# Patient Record
Sex: Female | Born: 1950
Health system: Southern US, Community
[De-identification: ages and names within clinical notes are randomized; demographics above are authoritative.]

## PROBLEM LIST (undated history)

## (undated) DIAGNOSIS — R188 Other ascites: Secondary | ICD-10-CM

## (undated) DIAGNOSIS — E785 Hyperlipidemia, unspecified: Secondary | ICD-10-CM

## (undated) DIAGNOSIS — I10 Essential (primary) hypertension: Secondary | ICD-10-CM

## (undated) DIAGNOSIS — E119 Type 2 diabetes mellitus without complications: Secondary | ICD-10-CM

## (undated) DIAGNOSIS — D510 Vitamin B12 deficiency anemia due to intrinsic factor deficiency: Secondary | ICD-10-CM

## (undated) DIAGNOSIS — C9310 Chronic myelomonocytic leukemia not having achieved remission: Secondary | ICD-10-CM

## (undated) DIAGNOSIS — E559 Vitamin D deficiency, unspecified: Secondary | ICD-10-CM

## (undated) HISTORY — DX: Hyperlipidemia, unspecified: E78.5

## (undated) HISTORY — DX: Vitamin D deficiency, unspecified: E55.9

## (undated) HISTORY — DX: Essential (primary) hypertension: I10

## (undated) HISTORY — DX: Vitamin B12 deficiency anemia due to intrinsic factor deficiency: D51.0

## (undated) HISTORY — DX: Type 2 diabetes mellitus without complications: E11.9

## (undated) HISTORY — PX: ABDOMINOPLASTY: SUR9

---
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
## 1998-06-13 HISTORY — PX: GASTRIC BYPASS: SHX52

## 1998-10-23 ENCOUNTER — Other Ambulatory Visit: Admission: RE | Admit: 1998-10-23 | Discharge: 1998-10-23 | Payer: Self-pay | Admitting: Internal Medicine

## 2000-10-02 ENCOUNTER — Other Ambulatory Visit: Admission: RE | Admit: 2000-10-02 | Discharge: 2000-10-02 | Payer: Self-pay | Admitting: Internal Medicine

## 2000-10-27 ENCOUNTER — Encounter: Admission: RE | Admit: 2000-10-27 | Discharge: 2001-01-25 | Payer: Self-pay | Admitting: Surgical Oncology

## 2003-01-01 ENCOUNTER — Encounter: Payer: Self-pay | Admitting: Specialist

## 2003-01-03 ENCOUNTER — Inpatient Hospital Stay (HOSPITAL_COMMUNITY): Admission: RE | Admit: 2003-01-03 | Discharge: 2003-01-06 | Payer: Self-pay | Admitting: Specialist

## 2003-01-03 ENCOUNTER — Encounter (INDEPENDENT_AMBULATORY_CARE_PROVIDER_SITE_OTHER): Payer: Self-pay | Admitting: Specialist

## 2004-04-23 ENCOUNTER — Ambulatory Visit: Payer: Self-pay | Admitting: Internal Medicine

## 2004-07-23 ENCOUNTER — Ambulatory Visit: Payer: Self-pay | Admitting: Internal Medicine

## 2004-10-22 ENCOUNTER — Ambulatory Visit: Payer: Self-pay | Admitting: Internal Medicine

## 2004-12-24 ENCOUNTER — Ambulatory Visit: Payer: Self-pay | Admitting: Internal Medicine

## 2005-03-31 ENCOUNTER — Ambulatory Visit: Payer: Self-pay | Admitting: Internal Medicine

## 2005-06-13 HISTORY — PX: OTHER SURGICAL HISTORY: SHX169

## 2005-06-17 ENCOUNTER — Ambulatory Visit: Payer: Self-pay | Admitting: Internal Medicine

## 2005-06-27 ENCOUNTER — Ambulatory Visit: Payer: Self-pay | Admitting: Internal Medicine

## 2005-10-07 ENCOUNTER — Ambulatory Visit: Payer: Self-pay | Admitting: Internal Medicine

## 2006-03-10 ENCOUNTER — Ambulatory Visit: Payer: Self-pay | Admitting: Internal Medicine

## 2006-05-26 ENCOUNTER — Ambulatory Visit: Payer: Self-pay | Admitting: Internal Medicine

## 2006-05-26 LAB — CONVERTED CEMR LAB: Folate: 13.5 ng/mL

## 2006-08-28 ENCOUNTER — Ambulatory Visit: Payer: Self-pay | Admitting: Internal Medicine

## 2006-11-24 ENCOUNTER — Ambulatory Visit: Payer: Self-pay | Admitting: Internal Medicine

## 2006-11-24 LAB — CONVERTED CEMR LAB
AST: 19 units/L (ref 0–37)
Albumin: 3.9 g/dL (ref 3.5–5.2)
Alkaline Phosphatase: 74 units/L (ref 39–117)
Basophils Relative: 0.7 % (ref 0.0–1.0)
Bilirubin, Direct: 0.1 mg/dL (ref 0.0–0.3)
CO2: 29 meq/L (ref 19–32)
Calcium: 9.2 mg/dL (ref 8.4–10.5)
Cholesterol: 206 mg/dL (ref 0–200)
Creatinine,U: 158.7 mg/dL
Eosinophils Relative: 0.9 % (ref 0.0–5.0)
GFR calc Af Amer: 133 mL/min
HCT: 38.9 % (ref 36.0–46.0)
HDL: 39.4 mg/dL (ref >39.0)
Hgb A1c MFr Bld: 8.3 % — ABNORMAL HIGH (ref 4.6–6.0)
Monocytes Relative: 7.7 % (ref 3.0–11.0)
Neutro Abs: 4.6 10*3/uL (ref 1.4–7.7)
Potassium: 3.7 meq/L (ref 3.5–5.1)
RDW: 12.2 % (ref 11.5–14.6)
Sodium: 141 meq/L (ref 135–145)
TSH: 1.64 microintl units/mL (ref 0.35–5.50)
Total Protein: 7.2 g/dL (ref 6.0–8.3)
VLDL: 33 mg/dL (ref 0–40)

## 2006-12-01 ENCOUNTER — Ambulatory Visit: Payer: Self-pay | Admitting: Internal Medicine

## 2006-12-27 DIAGNOSIS — I10 Essential (primary) hypertension: Secondary | ICD-10-CM | POA: Insufficient documentation

## 2006-12-27 DIAGNOSIS — E669 Obesity, unspecified: Secondary | ICD-10-CM | POA: Insufficient documentation

## 2007-01-19 ENCOUNTER — Ambulatory Visit: Payer: Self-pay | Admitting: Internal Medicine

## 2007-01-23 DIAGNOSIS — E1142 Type 2 diabetes mellitus with diabetic polyneuropathy: Secondary | ICD-10-CM | POA: Insufficient documentation

## 2007-01-23 DIAGNOSIS — E1165 Type 2 diabetes mellitus with hyperglycemia: Secondary | ICD-10-CM

## 2007-01-31 ENCOUNTER — Telehealth (INDEPENDENT_AMBULATORY_CARE_PROVIDER_SITE_OTHER): Payer: Self-pay | Admitting: *Deleted

## 2007-03-02 ENCOUNTER — Ambulatory Visit: Payer: Self-pay | Admitting: Internal Medicine

## 2007-03-02 DIAGNOSIS — D510 Vitamin B12 deficiency anemia due to intrinsic factor deficiency: Secondary | ICD-10-CM | POA: Insufficient documentation

## 2007-04-20 ENCOUNTER — Ambulatory Visit: Payer: Self-pay | Admitting: Internal Medicine

## 2007-04-20 LAB — CONVERTED CEMR LAB
Calcium: 9.7 mg/dL (ref 8.4–10.5)
GFR calc Af Amer: 112 mL/min
GFR calc non Af Amer: 92 mL/min
Hgb A1c MFr Bld: 6.8 % — ABNORMAL HIGH (ref 4.6–6.0)
Potassium: 5.2 meq/L — ABNORMAL HIGH (ref 3.5–5.1)

## 2007-04-27 ENCOUNTER — Ambulatory Visit: Payer: Self-pay | Admitting: Internal Medicine

## 2007-05-22 ENCOUNTER — Telehealth (INDEPENDENT_AMBULATORY_CARE_PROVIDER_SITE_OTHER): Payer: Self-pay | Admitting: *Deleted

## 2007-06-14 HISTORY — PX: ABDOMINAL HERNIA REPAIR: SHX539

## 2007-07-06 ENCOUNTER — Encounter: Payer: Self-pay | Admitting: Internal Medicine

## 2007-07-13 ENCOUNTER — Ambulatory Visit: Payer: Self-pay | Admitting: Internal Medicine

## 2007-07-18 ENCOUNTER — Telehealth: Payer: Self-pay | Admitting: Internal Medicine

## 2007-07-30 ENCOUNTER — Ambulatory Visit (HOSPITAL_BASED_OUTPATIENT_CLINIC_OR_DEPARTMENT_OTHER): Admission: RE | Admit: 2007-07-30 | Discharge: 2007-07-30 | Payer: Self-pay | Admitting: Gynecology

## 2007-09-03 ENCOUNTER — Ambulatory Visit: Payer: Self-pay | Admitting: Internal Medicine

## 2007-09-08 ENCOUNTER — Emergency Department (HOSPITAL_COMMUNITY): Admission: EM | Admit: 2007-09-08 | Discharge: 2007-09-08 | Payer: Self-pay | Admitting: Emergency Medicine

## 2007-09-08 DIAGNOSIS — M542 Cervicalgia: Secondary | ICD-10-CM | POA: Insufficient documentation

## 2007-09-11 ENCOUNTER — Ambulatory Visit: Payer: Self-pay | Admitting: Internal Medicine

## 2007-09-28 ENCOUNTER — Telehealth: Payer: Self-pay | Admitting: *Deleted

## 2007-09-28 ENCOUNTER — Encounter: Payer: Self-pay | Admitting: Internal Medicine

## 2007-10-05 ENCOUNTER — Ambulatory Visit: Payer: Self-pay | Admitting: Internal Medicine

## 2007-10-05 DIAGNOSIS — E785 Hyperlipidemia, unspecified: Secondary | ICD-10-CM | POA: Insufficient documentation

## 2007-10-05 DIAGNOSIS — T887XXA Unspecified adverse effect of drug or medicament, initial encounter: Secondary | ICD-10-CM | POA: Insufficient documentation

## 2007-10-05 LAB — CONVERTED CEMR LAB
Albumin: 3.9 g/dL (ref 3.5–5.2)
Alkaline Phosphatase: 68 units/L (ref 39–117)
Bilirubin, Direct: 0.1 mg/dL (ref 0.0–0.3)
HDL: 36.8 mg/dL — ABNORMAL LOW (ref >39.0)
Hgb A1c MFr Bld: 6.8 % — ABNORMAL HIGH (ref 4.6–6.0)
Microalb Creat Ratio: 3.4 mg/g (ref 0.0–30.0)
Microalb, Ur: 0.4 mg/dL (ref 0.0–1.9)
Total CHOL/HDL Ratio: 5.6
Total Protein: 6.8 g/dL (ref 6.0–8.3)
VLDL: 37 mg/dL (ref 0–40)

## 2007-10-09 ENCOUNTER — Telehealth: Payer: Self-pay | Admitting: Internal Medicine

## 2007-10-09 ENCOUNTER — Encounter: Payer: Self-pay | Admitting: Internal Medicine

## 2007-10-26 ENCOUNTER — Telehealth (INDEPENDENT_AMBULATORY_CARE_PROVIDER_SITE_OTHER): Payer: Self-pay | Admitting: *Deleted

## 2007-11-16 ENCOUNTER — Ambulatory Visit: Payer: Self-pay | Admitting: Internal Medicine

## 2007-11-16 DIAGNOSIS — E1142 Type 2 diabetes mellitus with diabetic polyneuropathy: Secondary | ICD-10-CM | POA: Insufficient documentation

## 2008-01-18 ENCOUNTER — Ambulatory Visit: Payer: Self-pay | Admitting: Internal Medicine

## 2008-01-30 ENCOUNTER — Ambulatory Visit: Payer: Self-pay | Admitting: Internal Medicine

## 2008-01-30 LAB — CONVERTED CEMR LAB
Basophils Absolute: 0 10*3/uL (ref 0.0–0.1)
Basophils Relative: 0.3 % (ref 0.0–3.0)
Calcium: 9.4 mg/dL (ref 8.4–10.5)
Eosinophils Absolute: 0 10*3/uL (ref 0.0–0.7)
Folate: 9.7 ng/mL
GFR calc Af Amer: 133 mL/min
GFR calc non Af Amer: 110 mL/min
Glucose, Bld: 80 mg/dL (ref 70–99)
Hgb A1c MFr Bld: 7 % — ABNORMAL HIGH (ref 4.6–6.0)
Lymphocytes Relative: 31.4 % (ref 12.0–46.0)
MCHC: 34.8 g/dL (ref 30.0–36.0)
MCV: 91.2 fL (ref 78.0–100.0)
Neutrophils Relative %: 56.4 % (ref 43.0–77.0)
Platelets: 194 10*3/uL (ref 150–400)
Potassium: 4 meq/L (ref 3.5–5.1)
RBC: 4.05 M/uL (ref 3.87–5.11)
Sodium: 144 meq/L (ref 135–145)
WBC: 6.8 10*3/uL (ref 4.5–10.5)

## 2008-04-22 ENCOUNTER — Ambulatory Visit: Payer: Self-pay | Admitting: Internal Medicine

## 2008-04-22 LAB — CONVERTED CEMR LAB
Calcium: 9.3 mg/dL (ref 8.4–10.5)
Creatinine,U: 77.3 mg/dL
GFR calc Af Amer: 133 mL/min
GFR calc non Af Amer: 110 mL/min
Microalb Creat Ratio: 5.2 mg/g (ref 0.0–30.0)
Microalb, Ur: 0.4 mg/dL (ref 0.0–1.9)
Potassium: 4.6 meq/L (ref 3.5–5.1)
Sodium: 141 meq/L (ref 135–145)

## 2008-06-03 ENCOUNTER — Ambulatory Visit: Payer: Self-pay | Admitting: Internal Medicine

## 2008-06-18 ENCOUNTER — Encounter: Payer: Self-pay | Admitting: Gynecology

## 2008-06-18 ENCOUNTER — Encounter: Payer: Self-pay | Admitting: Internal Medicine

## 2008-06-18 ENCOUNTER — Other Ambulatory Visit: Admission: RE | Admit: 2008-06-18 | Discharge: 2008-06-18 | Payer: Self-pay | Admitting: Gynecology

## 2008-06-18 ENCOUNTER — Ambulatory Visit: Payer: Self-pay | Admitting: Gynecology

## 2008-08-27 ENCOUNTER — Ambulatory Visit: Payer: Self-pay | Admitting: Internal Medicine

## 2008-12-03 ENCOUNTER — Ambulatory Visit: Payer: Self-pay | Admitting: Internal Medicine

## 2009-01-09 ENCOUNTER — Ambulatory Visit: Payer: Self-pay | Admitting: Internal Medicine

## 2009-02-13 ENCOUNTER — Ambulatory Visit: Payer: Self-pay | Admitting: Internal Medicine

## 2009-02-13 LAB — CONVERTED CEMR LAB
BUN: 15 mg/dL (ref 6–23)
Calcium: 9.3 mg/dL (ref 8.4–10.5)
Creatinine, Ser: 0.7 mg/dL (ref 0.4–1.2)
GFR calc non Af Amer: 91.52 mL/min (ref >60)
Glucose, Bld: 155 mg/dL — ABNORMAL HIGH (ref 70–99)
Hgb A1c MFr Bld: 8 % — ABNORMAL HIGH (ref 4.6–6.5)
Potassium: 4.9 meq/L (ref 3.5–5.1)

## 2009-04-03 ENCOUNTER — Ambulatory Visit: Payer: Self-pay | Admitting: Internal Medicine

## 2009-10-06 DIAGNOSIS — G4700 Insomnia, unspecified: Secondary | ICD-10-CM | POA: Insufficient documentation

## 2009-10-07 ENCOUNTER — Encounter: Payer: Self-pay | Admitting: Internal Medicine

## 2009-10-16 ENCOUNTER — Ambulatory Visit: Payer: Self-pay | Admitting: Internal Medicine

## 2009-10-16 LAB — CONVERTED CEMR LAB
BUN: 14 mg/dL (ref 6–23)
Calcium: 8.9 mg/dL (ref 8.4–10.5)
Chloride: 98 meq/L (ref 96–112)
GFR calc non Af Amer: 105.03 mL/min (ref >60)
Hgb A1c MFr Bld: 9.7 % — ABNORMAL HIGH (ref 4.6–6.5)

## 2009-10-20 ENCOUNTER — Encounter: Payer: Self-pay | Admitting: Internal Medicine

## 2009-10-28 ENCOUNTER — Ambulatory Visit: Payer: Self-pay | Admitting: Internal Medicine

## 2010-07-14 ENCOUNTER — Other Ambulatory Visit: Payer: Self-pay | Admitting: *Deleted

## 2010-07-14 ENCOUNTER — Other Ambulatory Visit: Payer: Self-pay | Admitting: Internal Medicine

## 2010-07-14 DIAGNOSIS — G4700 Insomnia, unspecified: Secondary | ICD-10-CM

## 2010-07-14 MED ORDER — ZOLPIDEM TARTRATE 10 MG PO TABS: 10.0000 mg | ORAL_TABLET | Freq: Every evening | ORAL | Status: DC | PRN

## 2010-07-15 NOTE — Medication Information (Signed)
Summary: Coverage Approval for Zolpidem Tartrate  Coverage Approval for Zolpidem Tartrate   Imported By: Maryln Gottron 10/12/2009 12:46:24  _____________________________________________________________________  External Attachment:    Type:   Image     Comment:   External Document

## 2010-07-15 NOTE — Assessment & Plan Note (Signed)
Summary: fup on labs//ccm   Vital Signs:  Patient profile:   60 year old female Height:      65 inches Weight:      236 pounds BMI:     39.41 Temp:     98.2 degrees F oral Pulse rate:   108 / minute Resp:     14 per minute BP sitting:   130 / 84  (left arm)  Vitals Entered By: Willy Eddy, LPN (Oct 28, 2009 9:02 AM) CC: roa labs, Hypertension Management   CC:  roa labs and Hypertension Management.  History of Present Illness: The pt has "slipped" and the a1c showes this the fasting CBG 323 has not been doing the finger sticks but resumed about 3 weeks ago got discouraged, moderate depression that she feels that she is "pulling out of" has started walking ans quit buying candy  Hypertension History:      She denies headache, chest pain, palpitations, dyspnea with exertion, orthopnea, PND, peripheral edema, visual symptoms, neurologic problems, syncope, and side effects from treatment.        Positive major cardiovascular risk factors include female age 95 years old or older, diabetes, hyperlipidemia, hypertension, and family history for ischemic heart disease (females less than 77 years old & males less than 46 years old).  Negative major cardiovascular risk factors include non-tobacco-user status.     Preventive Screening-Counseling & Management  Alcohol-Tobacco     Smoking Status: never     Passive Smoke Exposure: no  Problems Prior to Update: 1)  Insomnia, Chronic  (ICD-307.42) 2)  Polyneuropathy in Diabetes  (ICD-357.2) 3)  Uns Advrs Eff Uns Rx Medicinal&biological Sbstnc  (ICD-995.20) 4)  Hyperlipidemia  (ICD-272.4) 5)  Neck and Back Pain  (ICD-723.1) 6)  Preoperative Examination  (ICD-V72.84) 7)  Anemia, Pernicious  (ICD-281.0) 8)  Diabetes Mellitus, Type II  (ICD-250.00) 9)  Family History Diabetes 1st Degree Relative  (ICD-V18.0) 10)  Obesity  (ICD-278.00) 11)  Hypertension  (ICD-401.9)  Current Problems (verified): 1)  Insomnia, Chronic   (ICD-307.42) 2)  Polyneuropathy in Diabetes  (ICD-357.2) 3)  Uns Advrs Eff Uns Rx Medicinal&biological Sbstnc  (ICD-995.20) 4)  Hyperlipidemia  (ICD-272.4) 5)  Neck and Back Pain  (ICD-723.1) 6)  Preoperative Examination  (ICD-V72.84) 7)  Anemia, Pernicious  (ICD-281.0) 8)  Diabetes Mellitus, Type II  (ICD-250.00) 9)  Family History Diabetes 1st Degree Relative  (ICD-V18.0) 10)  Obesity  (ICD-278.00) 11)  Hypertension  (ICD-401.9)  Medications Prior to Update: 1)  Ambien 10 Mg  Tabs (Zolpidem Tartrate) .... As Needed 2)  Lisinopril-Hydrochlorothiazide 20-25 Mg Tabs (Lisinopril-Hydrochlorothiazide) .... One By Mouth Daily 3)  Vitamin B-12 Cr 1000 Mcg  Tbcr (Cyanocobalamin) .... Every Month 4)  Glimepiride 4 Mg Tabs (Glimepiride) .... One By Mouth Daily 5)  Accu-Chek Aviva   Strp (Glucose Blood) .[name].. 1 Once Dailyhold 6)  Metformin Hcl 500 Mg Xr24h-Tab (Metformin Hcl) .... One By Mouth Bid  Current Medications (verified): 1)  Ambien 10 Mg  Tabs (Zolpidem Tartrate) .... As Needed 2)  Lisinopril-Hydrochlorothiazide 20-25 Mg Tabs (Lisinopril-Hydrochlorothiazide) .... One By Mouth Daily 3)  Vitamin B-12 Cr 1000 Mcg  Tbcr (Cyanocobalamin) .... Every Month 4)  Glimepiride 4 Mg Tabs (Glimepiride) .... One By Mouth Daily 5)  Accu-Chek Aviva   Strp (Glucose Blood) .[name].. 1 Once Dailyhold 6)  Metformin Hcl 500 Mg Xr24h-Tab (Metformin Hcl) .... One By Mouth Bid  Allergies (verified): No Known Drug Allergies  Past History:  Family History: Last updated: 07/13/2007  Family History Other cancer: brother had colon cancer Fam hx CVA n=mother and father both had CVA at advanced age Family History Diabetes 1st degree relative: mother  Social History: Last updated: 01/23/2007 Never Smoked Alcohol use-no Drug use-no Occupation: Training and development officer Single  Risk Factors: Exercise: no (03/02/2007)  Risk Factors: Smoking Status: never (10/28/2009) Passive Smoke Exposure: no (10/28/2009)  Past  medical, surgical, family and social histories (including risk factors) reviewed, and no changes noted (except as noted below).  Past Medical History: Reviewed history from 01/30/2008 and no changes required. Hypertension Diabetes mellitus, type II Obesity b12  deficiency  Past Surgical History: Reviewed history from 07/13/2007 and no changes required. Caesarean section Tubal ligation Tonsillectomy Hemorrhoidectomy  Family History: Reviewed history from 07/13/2007 and no changes required. Family History Other cancer: brother had colon cancer Fam hx CVA n=mother and father both had CVA at advanced age Family History Diabetes 1st degree relative: mother  Social History: Reviewed history from 01/23/2007 and no changes required. Never Smoked Alcohol use-no Drug use-no Occupation: Training and development officer Single  Review of Systems       The patient complains of weight gain.  The patient denies anorexia, fever, weight loss, vision loss, decreased hearing, hoarseness, chest pain, syncope, dyspnea on exertion, peripheral edema, prolonged cough, headaches, hemoptysis, abdominal pain, melena, hematochezia, severe indigestion/heartburn, hematuria, incontinence, genital sores, muscle weakness, suspicious skin lesions, transient blindness, difficulty walking, depression, unusual weight change, abnormal bleeding, enlarged lymph nodes, angioedema, and breast masses.         tinnitus  Physical Exam  General:  well-developed and well-nourished.   Head:  normocephalic and atraumatic.   Eyes:  pupils equal and pupils round.   Ears:  R ear normal and L ear normal.   Neck:  supple and full ROM.   Lungs:  Normal respiratory effort, chest expands symmetrically. Lungs are clear to auscultation, no crackles or wheezes. Heart:  regular rhythm, tachycardia, and Grade  1 /6 systolic ejection murmur.   Abdomen:  soft, non-tender, and normal bowel sounds.     Impression & Recommendations:  Problem # 1:   POLYNEUROPATHY IN DIABETES (ICD-357.2) the a 1c places her at increased risk this was explained  Problem # 2:  ANEMIA, PERNICIOUS (ICD-281.0) monitering Her updated medication list for this problem includes:    Vitamin B-12 Cr 1000 Mcg Tbcr (Cyanocobalamin) ..... Every month  Orders: Vit B12 1000 mcg (J3420) Admin of Therapeutic Inj  intramuscular or subcutaneous (16109)  Hgb: 12.9 (01/30/2008)   Hct: 36.9 (01/30/2008)   Platelets: 194 (01/30/2008) RBC: 4.05 (01/30/2008)   RDW: 13.6 (01/30/2008)   WBC: 6.8 (01/30/2008) MCV: 91.2 (01/30/2008)   MCHC: 34.8 (01/30/2008) B12: 989 (01/30/2008)   Folate: 9.7 (01/30/2008)   TSH: 1.64 (11/24/2006)  Problem # 3:  DIABETES MELLITUS, TYPE II (ICD-250.00)  Her updated medication list for this problem includes:    Lisinopril-hydrochlorothiazide 20-25 Mg Tabs (Lisinopril-hydrochlorothiazide) ..... One by mouth daily    Glimepiride 4 Mg Tabs (Glimepiride) ..... One by mouth daily    Metformin Hcl 500 Mg Xr24h-tab (Metformin hcl) ..... One by mouth bid    Kombiglyze Xr 10-998 Mg Xr24h-tab (Saxagliptin-metformin) ..... One by mouth daily  Labs Reviewed: Creat: 0.6 (10/16/2009)     Last Eye Exam: normal (04/23/2009) Reviewed HgBA1c results: 9.7 (10/16/2009)  8.0 (02/13/2009)  Problem # 4:  OBESITY (ICD-278.00) weight loss is an essential part of the long term plan Ht: 65 (10/28/2009)   Wt: 236 (10/28/2009)   BMI: 39.41 (10/28/2009)  Complete  Medication List: 1)  Ambien 10 Mg Tabs (Zolpidem tartrate) .... As needed 2)  Lisinopril-hydrochlorothiazide 20-25 Mg Tabs (Lisinopril-hydrochlorothiazide) .... One by mouth daily 3)  Vitamin B-12 Cr 1000 Mcg Tbcr (Cyanocobalamin) .... Every month 4)  Glimepiride 4 Mg Tabs (Glimepiride) .... One by mouth daily 5)  Accu-chek Aviva Strp (Glucose blood) .[name].. 1 once dailyhold 6)  Metformin Hcl 500 Mg Xr24h-tab (Metformin hcl) .... One by mouth bid 7)  Kombiglyze Xr 10-998 Mg Xr24h-tab  (Saxagliptin-metformin) .... One by mouth daily  Hypertension Assessment/Plan:      The patient's hypertensive risk group is category C: Target organ damage and/or diabetes.  Today's blood pressure is 130/84.  Her blood pressure goal is < 130/80.  Patient Instructions: 1)  Please schedule a follow-up appointment in 2 months. 2)  HbgA1C prior to visit, ICD-9:  250.82 3)  It is important that you exercise regularly at least 20 minutes 5 times a week. If you develop chest pain, have severe difficulty breathing, or feel very tired , stop exercising immediately and seek medical attention. 4)  You need to lose weight. Consider a lower calorie diet and regular exercise.    Medication Administration  Injection # 1:    Medication: Vit B12 1000 mcg    Diagnosis: ANEMIA, PERNICIOUS (ICD-281.0)    Route: IM    Site: L deltoid    Exp Date: 03/12/2011    Lot #: [id number]    Mfr: [employer] Regent    Patient tolerated injection without complications    Given by: Willy Eddy, LPN (Oct 28, 2009 9:03 AM)  Orders Added: 1)  Vit B12 1000 mcg [J3420] 2)  Admin of Therapeutic Inj  intramuscular or subcutaneous [96372] 3)  Est. Patient Level IV [16109]

## 2010-09-06 ENCOUNTER — Encounter: Payer: Self-pay | Admitting: Internal Medicine

## 2010-10-26 NOTE — Op Note (Signed)
NAME:  Kathleen Gibson, Kathleen Gibson                   ACCOUNT NO.:  [id number]   MEDICAL RECORD NO.:  [id number]          PATIENT TYPE:  AMB   LOCATION:  [facility]                         FACILITY:  [facility]   PHYSICIAN:  Juan H. Lily Peer, M.D.DATE OF BIRTH:  1950-12-12   DATE OF PROCEDURE:  07/30/2007  DATE OF DISCHARGE:                               OPERATIVE REPORT   SURGEON:  Reynaldo Minium, MD.   FIRST ASSISTANT:  Colin Broach, MD.   INDICATIONS FOR OPERATION:  A 60 year old gravida 3, para 3 with  symptomatic rectocele.   PREOPERATIVE DIAGNOSIS:  Symptomatic rectocele.   POSTOPERATIVE DIAGNOSIS:  Symptomatic rectocele.   ANESTHESIA:  General endotracheal anesthesia.   FINDINGS:  Third-degree rectocele with normal uterine support.  No  enterocele.  Mild first-degree cystocele.   DESCRIPTION OF OPERATION:  After the patient was adequately counseled,  she was taken to the operating room where she underwent general  endotracheal anesthesia.  She had received a gram of Cefoxitin for  prophylaxis as well as PSA stockings for DVT prophylaxis.  She was  placed in the high lithotomy position.  A Foley catheter was inserted in  an effort to monitor urinary output.  The third-degree rectocele was  identified.  A triangular incision was made in the epithelium of the  overlying posterior fourchette perineal body.  The vaginal epithelium  was then opened in the midline, and dissection of posterior vaginal wall  was completed bilaterally exposing the fibromuscularis from side wall to  side wall.  The posterior colporrhaphy was then accomplished, including  the midline plication of vaginal fibromuscularis over the rectum using  interrupted sutures of 1-0 Vicryl suture.  The redundant vaginal mucosa  was then excised, and the vaginal mucosa was reapproximated with  interrupted sutures of 0 Vicryl suture.  The vagina was copiously  irrigated with normal saline solution and vaginal packing with Estrace  vaginal cream was placed into the vagina.  The patient was extubated and  transferred to recovery room with stable vital signs.  Blood loss from  procedure was 200 mL.  IV fluid consisted  of 1200 mL of Lactated  Ringer's.  Urine output was 250 mL and clear.      Juan H. Lily Peer, M.D.  Electronically Signed     JHF/MEDQ  D:  07/30/2007  T:  07/30/2007  Job:  [id number]

## 2010-10-26 NOTE — H&P (Signed)
NAME:  Kathleen Gibson, Kathleen Gibson                   ACCOUNT NO.:  [id number]   MEDICAL RECORD NO.:  [id number]          PATIENT TYPE:  AMB   LOCATION:  [facility]                         FACILITY:  [facility]   PHYSICIAN:  Juan H. Lily Peer, M.D.DATE OF BIRTH:  Aug 02, 1950   DATE OF ADMISSION:  DATE OF DISCHARGE:                              HISTORY & PHYSICAL   CHIEF COMPLAINT:  Symptomatic rectocele.   HISTORY:  The patient is a 60 year old gravida 3, para 3, who had been  seen in the practice on July 06, 2007, as a referral of Dr. Lovell Sheehan,  due to the fact that the patient has been complaining of fullness  sensation in her vagina.  It turned out the patient has a third-degree  rectocele and minimal, if any, first-degree cystocele.  No stress  urinary incontinence.  She underwent an urodynamic evaluation in the  office on June 26, 2007, with normal bladder function.  The  recommendation was to proceed for a posterior colporrhaphy without any  incontinence surgery.   PAST MEDICAL HISTORY:  The patient denies any allergies.  She has had 2  normal spontaneous vaginal deliveries and one cesarean section.  She has  a history of hypertension, type 2 diabetes, pernicious anemia, and  insomnia.  Previous operations consisted of cesarean section, tubal  ligation, tonsillectomy, hemorrhoidectomy, incisional herniorrhaphy, and  abdominoplasty.   For type 2 diabetes, she takes Diovan HCT 160/25 mg tablets once daily  and Janumet 50/1000 mg tablet one daily.  Last hemoglobin A1c in  November 2008 was 6.8.  Other medical problem is hypertension.  The  patient takes the Diovan HCT 160/25 mg daily.  She also takes Ambien 10  mg as needed.   SOCIAL HISTORY:  Her brother had had colon cancer.  Mother and father  both have cardiovascular disease, and first-degree relative and her  mother with diabetes.  The patient does not smoke.  Denies alcohol  consumption.  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   PHYSICAL EXAMINATION:   VITAL SIGNS:  The patient weighs 220 pounds,  temperature was 97.6, heart rate 120, respirations 14, and blood  pressure 130/70.  GENERAL:  Well-developed and well-nourished female.  HEENT:  Unremarkable.  NECK:  Supple.  Trachea midline.  No carotid bruits.  No thyromegaly.  LUNGS:  Clear to auscultation without rhonchi or wheezes.  HEART:  Regular rate and rhythm.  No murmurs or gallop.  BREASTS:  Not done.  ABDOMEN:  Pendulous.  Pfannenstiel scar present.  PELVIC:  Bartholin, urethral, and Skene glands are within normal limits.  A questionable first-degree cystocele and third-degree cystocele was  evident.  No evidence of an enterocele.  RECTAL:  Rectal exam confirm a third-degree rectocele.   ASSESSMENT:  A 60 year old gravida 3, para 3, with symptomatic rectocele  contributing to the patient's symptoms of pressure and fullness  sensation in the vaginal area.  The patient is scheduled to undergo a  posterior colporrhaphy in [facility] on Monday, July 30, 2007, at 7:30 a.m.  The risks, benefits, and pros and cons of the  procedure were discussed with the patient.  She will be placed on a 1-  day bowel prep as well.  The patient was cleared by Dr. Lovell Sheehan for her  surgery.  All questions were answered and we will follow accordingly.   PLAN:  The patient is scheduled for posterior colporrhaphy on Monday,  July 30, 2007, at 7:30 a.m. at [facility].      Juan H. Lily Peer, M.D.  Electronically Signed     JHF/MEDQ  D:  07/27/2007  T:  07/28/2007  Job:  [id number]

## 2010-10-29 NOTE — Discharge Summary (Signed)
   NAME:  Kathleen Gibson, Kathleen Gibson                             ACCOUNT NO.:  [id number]   MEDICAL RECORD NO.:  [id number]                   PATIENT TYPE:  INP   LOCATION:  5715                                 FACILITY:  [facility]   PHYSICIAN:  Earvin Hansen L. Shon Hough, M.D.           DATE OF BIRTH:  [birth year]   DATE OF ADMISSION:  01/03/2003  DATE OF DISCHARGE:  01/06/2003                                 DISCHARGE SUMMARY   HISTORY OF PRESENT ILLNESS:  The patient is a 60 year old lady admitted to  the hospital  for panniculectomy and excision and repair of ventral  herniation on January 03, 2003. The patient is status post  gastric bypass  surgery, loosing over 100 pounds.   HOSPITAL COURSE:  The patient did well postoperatively and was ambulatory by  the postoperative day #1. She had increased  ambulation and her appetite  picked up. She was discharged on January 06, 2003.   DISCHARGE DIAGNOSIS:  Severe panniculus with ventral hernia.   CONDITION ON DISCHARGE:  Improved. No complications.   FOLLOW UP:  Follow up in my office in 2 days.   DISCHARGE INSTRUCTIONS:  The patient was given instructions. All questions  were answered on a post surgery interview. The patient is to call me for any  medical problems.   DISCHARGE MEDICATIONS:  The patient has already been given prescriptions for  pain medication, antibiotic therapy.      Yaakov Guthrie. Shon Hough, M.D.                 Yaakov Guthrie. Shon Hough, M.D.    [name]  D:  01/05/2003  T:  01/05/2003  Job:  [id number]

## 2010-10-29 NOTE — H&P (Signed)
   NAME:  Kathleen Gibson, Kathleen Gibson                             ACCOUNT NO.:  [id number]   MEDICAL RECORD NO.:  [id number]                   PATIENT TYPE:  INP   LOCATION:  5715                                 FACILITY:  [facility]   PHYSICIAN:  Earvin Hansen L. Shon Hough, M.D.           DATE OF BIRTH:  Oct 06, 1950   DATE OF ADMISSION:  DATE OF DISCHARGE:                                HISTORY & PHYSICAL   HISTORY OF PRESENT ILLNESS:  The patient is a 60 year old lady who was  admitted to the hospital for a panniculectomy. She had previously had bypass  surgery, gastric type, losing over 100 pounds. She has a history of  increased  intertrigo, breakdown, pain, discomfort. She also  has a bulge  over the upper  abdomen causing pain  and discomfort. The patient is  admitted for panniculectomy as well as repair of ventral hernia.   ALLERGIES:  The patient has no allergies.   PAST MEDICAL HISTORY:  1. High blood pressure and she takes 1 medication for that which is Diovan 5     mg q. h.s.  2. Regular childhood illnesses.   PAST SURGICAL HISTORY:  Gastric bypass surgery in the past. The patient has  done well from that.   REVIEW OF SYSTEMS:  Negative. She  does have a history of diabetes prior to  her gastric bypass surgery, but since her surgery has not had any problems  having to take any medications  for diabetes. She denies any other  major  problems.   PHYSICAL EXAMINATION:  GENERAL:  Alert and oriented.  VITAL SIGNS:  The patient with good vital signs.  HEENT:  Negative.  NECK:  Supple without masses.  CHEST:  Clear to auscultation.  CARDIOVASCULAR:  Normal, S1 heard, S2 heard, without murmurs or gallops.  ABDOMEN:  Increased  panniculectomy, old evidence of intertrigo underneath.  She has an increased bulge of the upper  abdomen  on gentle palpation,  consistent with a large ventral hernia. Bowel sounds are normal. There is no  organomegaly.  EXTREMITIES:  She moves all extremities well.  NEUROLOGIC:  Cranial nerves 2 to 12 grossly intact.   ASSESSMENT:  1. Severe panniculus, status post gastric bypass surgery for obesity.  2. Ventral herniation with symptoms.    PLAN:  Admit to the hospital  for panniculectomy with repair of ventral  hernia after reconstruction of all the areas above. All the procedures in  detail as well as attendant risks and alternatives of treatment were  explained to the patient preoperatively and the patient with all this agrees  to consent to surgery.      Kathleen Gibson. Shon Hough, M.D.                 Kathleen Gibson. Shon Hough, M.D.    Kathleen Gibson  D:  01/05/2003  T:  01/05/2003  Job:  [id number]

## 2010-10-29 NOTE — Op Note (Signed)
NAME:  Kathleen Gibson, Kathleen Gibson                             ACCOUNT NO.:  [id number]   MEDICAL RECORD NO.:  [id number]                   PATIENT TYPE:  INP   LOCATION:  5715                                 FACILITY:  [facility]   PHYSICIAN:  Earvin Hansen L. Shon Hough, M.D.           DATE OF BIRTH:  23-May-1951   DATE OF PROCEDURE:  01/03/2003  DATE OF DISCHARGE:                                 OPERATIVE REPORT   INDICATIONS FOR PROCEDURE:  The patient is status post gastric bypass  surgery in the past and she has lost tremendous amounts of weight over 100  pounds.  She has severe intertrigo, rashes, irritation underneath the  panniculus causing problems with hygiene.  She has used talc's, ointments  and antibiotic treatment to improve this with no avail.  Also she has  increased pulling of both thighs of the lower abdomen causing her back to be  uncomfortable.  The patient also has a weakness in the abdominal wall just  above the large transverse scar of her upper abdomen, tender on palpitation,  measuring approximately 6 x 12 cm.  The patient is now being admitted for  panniculectomy with repair of ventral hernia.   SURGEON:  Yaakov Guthrie. Shon Hough, M.D.   ASSISTANT:  Alethia Berthold, O.P.A.   ANESTHESIA:  General.   DESCRIPTION OF PROCEDURE:  The patient was sterilely prepped and draped for  the abdominoplasty incision for the abdominoplasty panniculectomy.  She then  underwent general anesthesia, was intubated orally. Prep was down to the  chest and breast areas in a routine fashion, and the abdomen and groin using  Hibiclens solution along with sterile towels and drapes to make a sterile  field.  Tumescence solution was injected in all incision sites.   The wounds were scored with #15 blade and dissection was carried down  through a  through the Scarpa's fascia  to the underlying areolar. Using the  Bovie endo coagulation I was able to dissect the aureolus up to the  umbilicus. The umbilicus was then  released from its own pedicle, dissected  and carried on up to the midline area to an area just above the previous  transverse scar, showing a large hernia defect. The central most portion  measured approximately 8 x 12 cm, but a larger area measured approximately  12 x 12 cm.   The fascial planes were freed off after the omentum  was released and placed  back into the peritoneum. We elected to do the plication of the abdomen  first using a #1 running Prolene from the xiphoid process to the umbilicus  and from the umbilicus  down to the suprapubic area. Then on top of this we  on laid a large piece of Prolene mesh which measured approximately 12 x 12  cm and we sewed that in with 0 Surgilon sutures, multiple  in nature, with  good closure and support. The  wound was then irrigated with copious amounts  of bug juice and saline. Laterally at the ischial areas we did liposuction  to improve the contour and remove volt.   Next  the patient was placed in the jackknife position. Large wedges of skin  were excised. After proper hemostasis using the Bovie electrocoagulation, a  subcutaneous closure was done with2-0 Monocryl x2 layers and a running  subcuticular stitch of 3-0 Monocryl. The wounds were drained with large  Hemovac drains which were placed in the depths of the wound and brought out  through the suprapubic area. This was secured with 3-0 Prolene. Next  the  wounds were cleansed. Sterile dressings were applied including  Steri-  Strips, Xeroform, 4 x 4s, ABD pads and an abdominal support.   She was then taken to recovery in excellent condition. Estimated blood loss  was 200 ml. There were no complications.     Yaakov Guthrie. Shon Hough, M.D.                 Yaakov Guthrie. Shon Hough, M.D.    [name]  D:  01/05/2003  T:  01/05/2003  Job:  [id number]

## 2011-03-04 LAB — DIFFERENTIAL
Basophils Absolute: 0
Lymphocytes Relative: 26
Lymphs Abs: 1.9
Monocytes Absolute: 0.7
Monocytes Relative: 10
Neutro Abs: 4.5

## 2011-03-04 LAB — CBC
Hemoglobin: 10.1 — ABNORMAL LOW
RBC: 3.18 — ABNORMAL LOW

## 2011-08-01 ENCOUNTER — Other Ambulatory Visit: Payer: Self-pay | Admitting: *Deleted

## 2011-08-01 MED ORDER — ZOLPIDEM TARTRATE 10 MG PO TABS: 10.0000 mg | ORAL_TABLET | Freq: Every evening | ORAL | Status: DC | PRN

## 2011-08-26 ENCOUNTER — Other Ambulatory Visit: Payer: Self-pay | Admitting: Internal Medicine

## 2012-08-16 ENCOUNTER — Telehealth: Payer: Self-pay | Admitting: Family Medicine

## 2012-08-16 NOTE — Telephone Encounter (Signed)
Pt lives w/her niece, Amy Public relations account executive, who was in to see you this morning. Mrs. Laverda Sorenson says you said it would be ok to see pt prior to April 3rd so it would not hold up their adoption. Is this ok? If so, do you want me to put her in any 30 min slot, or do you have a day/time you prefer? Thank you.

## 2012-08-16 NOTE — Telephone Encounter (Signed)
Yes and ok to put her in any 30 minute slot.

## 2012-08-16 NOTE — Telephone Encounter (Signed)
Pt r/s to 08/20/2012 at 12:15 p.m.

## 2012-08-20 ENCOUNTER — Encounter: Payer: Self-pay | Admitting: Family Medicine

## 2012-08-20 ENCOUNTER — Ambulatory Visit (INDEPENDENT_AMBULATORY_CARE_PROVIDER_SITE_OTHER): Payer: BC Managed Care – PPO | Admitting: Family Medicine

## 2012-08-20 ENCOUNTER — Other Ambulatory Visit (HOSPITAL_COMMUNITY)
Admission: RE | Admit: 2012-08-20 | Discharge: 2012-08-20 | Disposition: A | Discharge status: Home / Self Care | Payer: BC Managed Care – PPO | Source: Ambulatory Visit | Attending: Family Medicine | Admitting: Family Medicine

## 2012-08-20 VITALS — BP 120/72 | HR 104 | Temp 97.8°F | Ht 62.0 in | Wt 225.0 lb

## 2012-08-20 DIAGNOSIS — D510 Vitamin B12 deficiency anemia due to intrinsic factor deficiency: Secondary | ICD-10-CM

## 2012-08-20 DIAGNOSIS — Z1151 Encounter for screening for human papillomavirus (HPV): Secondary | ICD-10-CM | POA: Insufficient documentation

## 2012-08-20 DIAGNOSIS — Z0000 Encounter for general adult medical examination without abnormal findings: Secondary | ICD-10-CM

## 2012-08-20 DIAGNOSIS — E785 Hyperlipidemia, unspecified: Secondary | ICD-10-CM

## 2012-08-20 DIAGNOSIS — Z01419 Encounter for gynecological examination (general) (routine) without abnormal findings: Secondary | ICD-10-CM | POA: Insufficient documentation

## 2012-08-20 DIAGNOSIS — E669 Obesity, unspecified: Secondary | ICD-10-CM

## 2012-08-20 DIAGNOSIS — Z1231 Encounter for screening mammogram for malignant neoplasm of breast: Secondary | ICD-10-CM

## 2012-08-20 DIAGNOSIS — I10 Essential (primary) hypertension: Secondary | ICD-10-CM

## 2012-08-20 DIAGNOSIS — E119 Type 2 diabetes mellitus without complications: Secondary | ICD-10-CM

## 2012-08-20 DIAGNOSIS — Z1211 Encounter for screening for malignant neoplasm of colon: Secondary | ICD-10-CM

## 2012-08-20 DIAGNOSIS — Z515 Encounter for palliative care: Secondary | ICD-10-CM | POA: Insufficient documentation

## 2012-08-20 LAB — COMPREHENSIVE METABOLIC PANEL
ALT: 18 U/L (ref 0–35)
AST: 19 U/L (ref 0–37)
Albumin: 4 g/dL (ref 3.5–5.2)
CO2: 29 mEq/L (ref 19–32)
Calcium: 9.1 mg/dL (ref 8.4–10.5)
Chloride: 99 mEq/L (ref 96–112)
Creatinine, Ser: 0.7 mg/dL (ref 0.4–1.2)
GFR: 90.42 mL/min (ref >60.00)
Potassium: 4 mEq/L (ref 3.5–5.1)
Sodium: 137 mEq/L (ref 135–145)
Total Protein: 7.5 g/dL (ref 6.0–8.3)

## 2012-08-20 LAB — CBC WITH DIFFERENTIAL/PLATELET
Basophils Relative: 0.3 % (ref 0.0–3.0)
Eosinophils Relative: 0.2 % (ref 0.0–5.0)
HCT: 37.6 % (ref 36.0–46.0)
Lymphs Abs: 2.3 10*3/uL (ref 0.7–4.0)
Monocytes Relative: 18.9 % — ABNORMAL HIGH (ref 3.0–12.0)
Neutrophils Relative %: 53 % (ref 43.0–77.0)
Platelets: 127 10*3/uL — ABNORMAL LOW (ref 150.0–400.0)
RBC: 4.38 Mil/uL (ref 3.87–5.11)
WBC: 8.4 10*3/uL (ref 4.5–10.5)

## 2012-08-20 LAB — HEMOGLOBIN A1C: Hgb A1c MFr Bld: 9.2 % — ABNORMAL HIGH (ref 4.6–6.5)

## 2012-08-20 LAB — LIPID PANEL: HDL: 37.6 mg/dL — ABNORMAL LOW (ref >39.00)

## 2012-08-20 MED ORDER — ZOLPIDEM TARTRATE 10 MG PO TABS: 10.0000 mg | ORAL_TABLET | Freq: Every evening | ORAL | Status: DC | PRN

## 2012-08-20 NOTE — Patient Instructions (Addendum)
Nice to meet you. We will call you with your lab results.  Please stop by to see Shirlee Limerick on your way out.  Check with your insurance to see if they will cover the shingles shot.

## 2012-08-20 NOTE — Progress Notes (Signed)
Subjective:    Patient ID: Kathleen Gibson, female    DOB: 02/28/1951, 62 y.o.   MRN: [id number]  HPI  62 yo very pleasant female with h/o DM, HTN, HLD here to establish care.  Over due for CPX.  She would like this done today as well.  Her niece are adopting a child and she needs adoption forms filled out so she can help care for the child.  G3P3.  No h/o abnormal pap smears.  No family h/o ovarian, breast or uterine CA.  Overdue for pap and mammogram.  Never had a colonoscopy and would prefer not to have one.    DM- On Janumet 50- 1000, 1 tablet twice daily and as needed amaryl.  a1c was elevated- last checked in 2011!  Denies any episodes of hypoglycemia. Lab Results  Component Value Date   HGBA1C 9.7* 10/16/2009   HTN- BP has been well controlled on Prinzide 20/25 mg daily. Denies any HA, blurred vision, CP or SOB.    She has been very fatigued and achy lately.  Obesity- h/o gastric bypass in 2004.  Lost 100 pounds, has regained 20 pounds.  Patient Active Problem List  Diagnosis  . DIABETES MELLITUS, TYPE II  . HYPERLIPIDEMIA  . OBESITY  . ANEMIA, PERNICIOUS  . INSOMNIA, CHRONIC  . POLYNEUROPATHY IN DIABETES  . HYPERTENSION  . NECK AND BACK PAIN  . UNS ADVRS EFF UNS RX MEDICINAL&BIOLOGICAL SBSTNC   No past medical history on file. No past surgical history on file. History  Substance Use Topics  . Smoking status: Never Smoker   . Smokeless tobacco: Not on file  . Alcohol Use: Not on file   No family history on file. No Known Allergies Current Outpatient Prescriptions on File Prior to Visit  Medication Sig Dispense Refill  . glimepiride (AMARYL) 4 MG tablet TAKE ONE TABLET BY MOUTH EVERY DAY  90 tablet  3  . lisinopril-hydrochlorothiazide (PRINZIDE,ZESTORETIC) 20-25 MG per tablet TAKE ONE TABLET BY MOUTH EVERY DAY  90 tablet  3   No current facility-administered medications on file prior to visit.   The PMH, PSH, Social History, Family History, Medications, and  allergies have been reviewed in [facility], and have been updated if relevant.   Review of Systems See HPI Patient reports no  vision/ hearing changes,anorexia, weight change, fever ,adenopathy, persistant / recurrent hoarseness, swallowing issues, chest pain, edema,persistant / recurrent cough, hemoptysis, dyspnea(rest, exertional, paroxysmal nocturnal), gastrointestinal  bleeding (melena, rectal bleeding), abdominal pain, excessive heart burn, GU symptoms(dysuria, hematuria, pyuria, voiding/incontinence  Issues) syncope, focal weakness, severe memory loss, concerning skin lesions, depression, anxiety, abnormal bruising/bleeding, major joint swelling, breast masses or abnormal vaginal bleeding.       Objective:   Physical Exam BP 120/72  Pulse 104  Temp(Src) 97.8 F (36.6 C)  Ht 5\' 2"  (1.575 m)  Wt 225 lb (102.059 kg)  BMI 41.14 kg/m2  General:  Obese ,well-nourished,in no acute distress; alert,appropriate and cooperative throughout examination Head:  normocephalic and atraumatic.   Eyes:  vision grossly intact, pupils equal, pupils round, and pupils reactive to light.   Ears:  R ear normal and L ear normal.   Nose:  no external deformity.   Mouth:  good dentition.   Neck:  No deformities, masses, or tenderness noted. Breasts:  No mass, nodules, thickening, tenderness, bulging, retraction, inflamation, nipple discharge or skin changes noted.   Lungs:  Normal respiratory effort, chest expands symmetrically. Lungs are clear to auscultation, no crackles or wheezes.  Heart:  Normal rate and regular rhythm. S1 and S2 normal without gallop, murmur, click, rub or other extra sounds. Abdomen:  Bowel sounds positive,abdomen soft and non-tender without masses, organomegaly or hernias noted. Rectal:  no external abnormalities.   Genitalia:  Pelvic Exam:        External: normal female genitalia without lesions or masses        Vagina: normal without lesions or masses        Cervix: normal without  lesions or masses        Adnexa: normal bimanual exam without masses or fullness        Uterus: normal by palpation        Pap smear: performed Msk:  No deformity or scoliosis noted of thoracic or lumbar spine.   Extremities:  No clubbing, cyanosis, edema, or deformity noted with normal full range of motion of all joints.   Neurologic:  alert & oriented X3 and gait normal.   Skin:  Intact without suspicious lesions or rashes Cervical Nodes:  No lymphadenopathy noted Axillary Nodes:  No palpable lymphadenopathy Psych:  Cognition and judgment appear intact. Alert and cooperative with normal attention span and concentration. No apparent delusions, illusions, hallucinations      Assessment & Plan:  1. DIABETES MELLITUS, TYPE II Admits to not checking blood sugar and not being compliant with diet or exercise.  She does take her meds daily. Check labs today. - Hemoglobin A1c  2. HYPERLIPIDEMIA  - Lipid panel  3. HYPERTENSION Stable on current meds. - Comprehensive metabolic panel  4. ANEMIA, PERNICIOUS Has been lost to follow up.  Overdue for B12 injection.  Check labs today to get baseline.  The patient indicates understanding of these issues and agrees with the plan.  - Vitamin B12 - CBC with Differential  5. Other screening mammogram  - MM Digital Screening; Future  6. Special screening for malignant neoplasms, colon Declined colonoscopy but agreed to IFOB. - Fecal occult blood, imunochemical; Future  7. Routine general medical examination at a health care facility Reviewed preventive care protocols, scheduled due services, and updated immunizations Discussed nutrition, exercise, diet, and healthy lifestyle.  Discussed zostavax, pneumovax- she will call insurance company.  Pap smear today, mammogram ordered.    Form filled out for adoption agency and returned to patient.  8. OBESITY  Deteriorated.  Discussed increasing physical activity.

## 2012-08-21 LAB — VITAMIN B12: Vitamin B-12: 946 pg/mL — ABNORMAL HIGH (ref 211–911)

## 2012-08-21 LAB — LDL CHOLESTEROL, DIRECT: Direct LDL: 149.3 mg/dL

## 2012-08-23 ENCOUNTER — Ambulatory Visit (INDEPENDENT_AMBULATORY_CARE_PROVIDER_SITE_OTHER): Payer: BC Managed Care – PPO | Admitting: *Deleted

## 2012-08-23 DIAGNOSIS — D510 Vitamin B12 deficiency anemia due to intrinsic factor deficiency: Secondary | ICD-10-CM

## 2012-08-23 MED ORDER — CYANOCOBALAMIN 1000 MCG/ML IJ SOLN
1000.0000 ug | Freq: Once | INTRAMUSCULAR | Status: AC
  Administered 2012-08-23: 1000 ug via INTRAMUSCULAR

## 2012-08-24 ENCOUNTER — Encounter: Payer: Self-pay | Admitting: Family Medicine

## 2012-08-24 ENCOUNTER — Encounter: Payer: Self-pay | Admitting: *Deleted

## 2012-08-24 ENCOUNTER — Other Ambulatory Visit (INDEPENDENT_AMBULATORY_CARE_PROVIDER_SITE_OTHER): Payer: BC Managed Care – PPO

## 2012-08-24 DIAGNOSIS — Z1211 Encounter for screening for malignant neoplasm of colon: Secondary | ICD-10-CM

## 2012-08-24 LAB — HM PAP SMEAR: HM Pap smear: NORMAL

## 2012-08-27 ENCOUNTER — Encounter: Payer: Self-pay | Admitting: *Deleted

## 2012-08-27 ENCOUNTER — Encounter: Payer: Self-pay | Admitting: Family Medicine

## 2012-09-02 ENCOUNTER — Other Ambulatory Visit: Payer: Self-pay | Admitting: Internal Medicine

## 2012-09-04 ENCOUNTER — Other Ambulatory Visit: Payer: Self-pay | Admitting: *Deleted

## 2012-09-04 ENCOUNTER — Telehealth: Payer: Self-pay

## 2012-09-04 MED ORDER — LISINOPRIL-HYDROCHLOROTHIAZIDE 20-25 MG PO TABS: ORAL_TABLET | ORAL | Status: DC

## 2012-09-04 NOTE — Telephone Encounter (Signed)
Pt request refill lisinopril hctz 20-25 mg. Advised pt Dr Lovell Sheehan refilled already to [facility] [address]. Pt said OK she will pick up med.

## 2012-09-13 ENCOUNTER — Ambulatory Visit: Payer: Self-pay | Admitting: Family Medicine

## 2012-10-11 ENCOUNTER — Other Ambulatory Visit: Payer: Self-pay | Admitting: Family Medicine

## 2012-10-12 NOTE — Telephone Encounter (Signed)
[facility] 

## 2012-12-25 ENCOUNTER — Other Ambulatory Visit: Payer: Self-pay | Admitting: Family Medicine

## 2012-12-25 NOTE — Telephone Encounter (Signed)
rx called to pharmacy 

## 2013-01-08 ENCOUNTER — Ambulatory Visit (INDEPENDENT_AMBULATORY_CARE_PROVIDER_SITE_OTHER): Payer: BC Managed Care – PPO

## 2013-01-08 DIAGNOSIS — E538 Deficiency of other specified B group vitamins: Secondary | ICD-10-CM

## 2013-01-08 MED ORDER — CYANOCOBALAMIN 1000 MCG/ML IJ SOLN
1000.0000 ug | Freq: Once | INTRAMUSCULAR | Status: AC
  Administered 2013-01-08: 1000 ug via INTRAMUSCULAR

## 2013-01-22 ENCOUNTER — Other Ambulatory Visit: Payer: Self-pay | Admitting: Family Medicine

## 2013-01-23 NOTE — Telephone Encounter (Signed)
plz phone in. 

## 2013-01-23 NOTE — Telephone Encounter (Signed)
[facility] 

## 2013-02-21 ENCOUNTER — Other Ambulatory Visit: Payer: Self-pay | Admitting: Family Medicine

## 2013-02-22 NOTE — Telephone Encounter (Signed)
To pcp

## 2013-02-22 NOTE — Telephone Encounter (Signed)
[facility] 

## 2013-03-15 ENCOUNTER — Encounter: Payer: Self-pay | Admitting: Radiology

## 2013-03-18 ENCOUNTER — Encounter: Payer: Self-pay | Admitting: Family Medicine

## 2013-03-18 ENCOUNTER — Ambulatory Visit (INDEPENDENT_AMBULATORY_CARE_PROVIDER_SITE_OTHER): Payer: BC Managed Care – PPO | Admitting: Family Medicine

## 2013-03-18 VITALS — BP 112/74 | HR 104 | Temp 97.7°F | Wt 216.1 lb

## 2013-03-18 DIAGNOSIS — E538 Deficiency of other specified B group vitamins: Secondary | ICD-10-CM

## 2013-03-18 DIAGNOSIS — D510 Vitamin B12 deficiency anemia due to intrinsic factor deficiency: Secondary | ICD-10-CM

## 2013-03-18 DIAGNOSIS — R21 Rash and other nonspecific skin eruption: Secondary | ICD-10-CM

## 2013-03-18 DIAGNOSIS — E119 Type 2 diabetes mellitus without complications: Secondary | ICD-10-CM

## 2013-03-18 LAB — COMPREHENSIVE METABOLIC PANEL
ALT: 17 U/L (ref 0–35)
AST: 20 U/L (ref 0–37)
Alkaline Phosphatase: 46 U/L (ref 39–117)
Glucose, Bld: 135 mg/dL — ABNORMAL HIGH (ref 70–99)
Potassium: 4.2 mEq/L (ref 3.5–5.1)
Sodium: 135 mEq/L (ref 135–145)
Total Bilirubin: 1 mg/dL (ref 0.3–1.2)
Total Protein: 7.5 g/dL (ref 6.0–8.3)

## 2013-03-18 LAB — HEMOGLOBIN A1C: Hgb A1c MFr Bld: 8 % — ABNORMAL HIGH (ref 4.6–6.5)

## 2013-03-18 LAB — VITAMIN B12: Vitamin B-12: >1500 pg/mL — ABNORMAL HIGH (ref 211–911)

## 2013-03-18 MED ORDER — FLUOCINONIDE-E 0.05 % EX CREA: TOPICAL_CREAM | Freq: Two times a day (BID) | CUTANEOUS | Status: DC

## 2013-03-18 MED ORDER — CYANOCOBALAMIN 1000 MCG/ML IJ SOLN
1000.0000 ug | Freq: Once | INTRAMUSCULAR | Status: AC
  Administered 2013-03-18: 1000 ug via INTRAMUSCULAR

## 2013-03-18 NOTE — Patient Instructions (Addendum)
Good to see you. We will call you with your lab results.  Try lidex cream twice daily for no more than 10 days without calling me.

## 2013-03-18 NOTE — Progress Notes (Signed)
Subjective:    Patient ID: Kathleen Gibson, female    DOB: 1950/10/01, 62 y.o.   MRN: [id number]  HPI  62 yo very pleasant female with h/o DM, HTN, HLD here for rash.  Rash- itchy rash on legs bilaterally x 1 month, comes and goes.  Looked liked insect bites at first but did not improve with topical OTC hydrocortisone.  Zyrtec and benadryl do not help with itching.  No one else in household has these lesions. No changes in soaps or detergents.  Has not been seen since she established care in March for follow up of he DM- On Janumet 50- 1000, 1 tablet twice daily and as needed amaryl.  a1c was elevated- in March. Denies any episodes of hypoglycemia.  Working on diet and weight loss. Lab Results  Component Value Date   HGBA1C 9.2* 08/20/2012   Wt Readings from Last 3 Encounters:  03/18/13 216 lb 1.9 oz (98.031 kg)  08/20/12 225 lb (102.059 kg)  10/28/09 236 lb (107.049 kg)    HTN- BP has been well controlled on Prinzide 20/25 mg daily. Denies any HA, blurred vision, CP or SOB.   Lab Results  Component Value Date   CREATININE 0.7 08/20/2012     Patient Active Problem List   Diagnosis Date Noted  . Rash and nonspecific skin eruption 03/18/2013  . Routine general medical examination at a health care facility 08/20/2012  . INSOMNIA, CHRONIC 10/06/2009  . POLYNEUROPATHY IN DIABETES 11/16/2007  . HYPERLIPIDEMIA 10/05/2007  . UNS ADVRS EFF UNS RX MEDICINAL&BIOLOGICAL SBSTNC 10/05/2007  . NECK AND BACK PAIN 09/08/2007  . ANEMIA, PERNICIOUS 03/02/2007  . DIABETES MELLITUS, TYPE II 01/23/2007  . OBESITY 12/27/2006  . HYPERTENSION 12/27/2006   No past medical history on file. No past surgical history on file. History  Substance Use Topics  . Smoking status: Never Smoker   . Smokeless tobacco: Not on file  . Alcohol Use: Not on file   No family history on file. No Known Allergies Current Outpatient Prescriptions on File Prior to Visit  Medication Sig Dispense Refill  .  lisinopril-hydrochlorothiazide (PRINZIDE,ZESTORETIC) 20-25 MG per tablet TAKE ONE TABLET BY MOUTH EVERY DAY  90 tablet  3  . sitaGLIPtan-metformin (JANUMET) 50-1000 MG per tablet Take 1 tablet by mouth 2 (two) times daily with a meal.      . zolpidem (AMBIEN) 10 MG tablet TAKE 1 TABLET BY MOUTH AT BEDTIME  30 tablet  0   No current facility-administered medications on file prior to visit.   The PMH, PSH, Social History, Family History, Medications, and allergies have been reviewed in [facility], and have been updated if relevant.   Review of Systems See HPI     Objective:   Physical Exam BP 112/74  Pulse 104  Temp(Src) 97.7 F (36.5 C) (Oral)  Wt 216 lb 1.9 oz (98.031 kg)  BMI 39.52 kg/m2  SpO2 98%  General:  Obese ,well-nourished,in no acute distress; alert,appropriate and cooperative throughout examination Head:  normocephalic and atraumatic.   Eyes:  vision grossly intact, pupils equal, pupils round, and pupils reactive to light.   Ears:  R ear normal and L ear normal.   Nose:  no external deformity.   Mouth:  good dentition.   Lungs:  Normal respiratory effort, chest expands symmetrically. Lungs are clear to auscultation, no crackles or wheezes. Heart:  Normal rate and regular rhythm. S1 and S2 normal without gallop, murmur, click, rub or other extra sounds. Abdomen:  Bowel sounds  positive,abdomen soft and non-tender without masses, organomegaly or hernias noted. Msk:  No deformity or scoliosis noted of thoracic or lumbar spine.   Extremities:  No clubbing, cyanosis, edema, or deformity noted with normal full range of motion of all joints.   Neurologic:  alert & oriented X3 and gait normal.   Skin:  Intact without suspicious lesions or rashes Multiple small, scabbed over lesions on lower leg bilaterally, no burrowing marks or erythema, no drainage Psych:  Cognition and judgment appear intact. Alert and cooperative with normal attention span and concentration. No apparent delusions,  illusions, hallucinations      Assessment & Plan:  1. DIABETES MELLITUS, TYPE II Not well controlled. Recheck a1 today.  States she is compliant with medications.   - Comprehensive metabolic panel - Hemoglobin A1c  2. Rash and nonspecific skin eruption Does look like insect bites vs hives, less likely scabies. Will try lidex twice daily x 10 days. If no improvement, try prethrin and biopsy. The patient indicates understanding of these issues and agrees with the plan.  - Comprehensive metabolic panel  3. B12 deficiency  - Vitamin B12 - cyanocobalamin ((VITAMIN B-12)) injection 1,000 mcg; Inject 1 mL (1,000 mcg total) into the muscle once.

## 2013-03-19 ENCOUNTER — Encounter: Payer: Self-pay | Admitting: Family Medicine

## 2013-03-20 ENCOUNTER — Telehealth: Payer: Self-pay

## 2013-03-20 MED ORDER — GLYBURIDE 2.5 MG PO TABS: 2.5000 mg | ORAL_TABLET | Freq: Every day | ORAL | Status: DC

## 2013-03-20 NOTE — Telephone Encounter (Signed)
Pt left v/m she received email from Dr Dayton Martes and pt said she does not have Amaryl. If Dr Dayton Martes wants pt to take Amaryl daily [facility]. Pt said glimepiride 4mg  caused blood sugar to drop quickly. Before pt restarts glimepiride Pt request cb.

## 2013-03-20 NOTE — Telephone Encounter (Signed)
Noted.  Let's try glyburide instead.  Rx sent to her pharmacy

## 2013-03-21 NOTE — Telephone Encounter (Signed)
Pt advised. Verbalized understanding.

## 2013-03-26 ENCOUNTER — Other Ambulatory Visit: Payer: Self-pay | Admitting: Family Medicine

## 2013-03-27 NOTE — Telephone Encounter (Signed)
Last filled 02/22/13 

## 2013-03-27 NOTE — Telephone Encounter (Signed)
Phoned in to pharmacy. 

## 2013-04-11 ENCOUNTER — Other Ambulatory Visit: Payer: Self-pay | Admitting: Family Medicine

## 2013-04-11 NOTE — Telephone Encounter (Signed)
Electronic refill request.  Please advise. 

## 2013-04-12 ENCOUNTER — Other Ambulatory Visit: Payer: Self-pay

## 2013-04-12 MED ORDER — SITAGLIPTIN PHOS-METFORMIN HCL 50-1000 MG PO TABS: 1.0000 | ORAL_TABLET | Freq: Two times a day (BID) | ORAL | Status: DC

## 2013-04-12 NOTE — Telephone Encounter (Signed)
Pt request refill Janumet to [facility]. Dr Dayton Martes has never filled before and pt's A1C 03/18/13 was 8.0. Please advise. Pt will ck with [facility] later otherwise will wait on call back. Pt has 2 weeks of med left now.

## 2013-04-15 ENCOUNTER — Other Ambulatory Visit: Payer: Self-pay | Admitting: Family Medicine

## 2013-04-15 NOTE — Telephone Encounter (Signed)
Phoned in to pharmacy. 

## 2013-04-15 NOTE — Telephone Encounter (Signed)
Ok to phone in.

## 2013-04-18 ENCOUNTER — Other Ambulatory Visit: Payer: Self-pay

## 2013-04-18 ENCOUNTER — Encounter: Payer: Self-pay | Admitting: Radiology

## 2013-04-19 ENCOUNTER — Telehealth: Payer: Self-pay

## 2013-04-19 ENCOUNTER — Ambulatory Visit (INDEPENDENT_AMBULATORY_CARE_PROVIDER_SITE_OTHER): Payer: BC Managed Care – PPO

## 2013-04-19 DIAGNOSIS — E538 Deficiency of other specified B group vitamins: Secondary | ICD-10-CM

## 2013-04-19 MED ORDER — CYANOCOBALAMIN 1000 MCG/ML IJ SOLN
1000.0000 ug | Freq: Once | INTRAMUSCULAR | Status: AC
  Administered 2013-04-19: 1000 ug via INTRAMUSCULAR

## 2013-04-19 NOTE — Telephone Encounter (Signed)
monthly

## 2013-04-19 NOTE — Telephone Encounter (Signed)
Patient notified as instructed by telephone v/m.  

## 2013-04-19 NOTE — Telephone Encounter (Signed)
Pt was in this morning for Vit B 12 injection; pt thinks Dr Dayton Martes told her to come monthly for B 12 injections; pt Vit B 12 level 03/18/13 was > 1500. Pt wants to verify how often she should come for injections.

## 2013-04-26 ENCOUNTER — Encounter: Payer: Self-pay | Admitting: *Deleted

## 2013-04-30 ENCOUNTER — Other Ambulatory Visit: Payer: Self-pay

## 2013-04-30 MED ORDER — ZOLPIDEM TARTRATE 10 MG PO TABS: ORAL_TABLET | ORAL | Status: DC

## 2013-04-30 NOTE — Telephone Encounter (Signed)
RX phoned in to pharmacy and mailed to address below.

## 2013-04-30 NOTE — Telephone Encounter (Signed)
Kathleen Gibson, i printed out the RX. Can you call it in to the pharmacy and mail off the printed RX?

## 2013-04-30 NOTE — Telephone Encounter (Signed)
Pt will be in [place] for another 3 weeks and request refill zolpidem. Pt has a refill available at [facility]. Spoke with pharmacist at [facility] in [place] [phone] and he said it is [place] law they cannot transfer a controlled med from another state. Pharmacist said will need to call zolpidem to [facility] in [place] followed by written hard copy rx mailed to [address] [facility] [place], [place].  Pt will ck with pharmacy later today for refill.Please advise.

## 2013-05-27 ENCOUNTER — Ambulatory Visit (INDEPENDENT_AMBULATORY_CARE_PROVIDER_SITE_OTHER): Payer: BC Managed Care – PPO | Admitting: Family Medicine

## 2013-05-27 ENCOUNTER — Encounter: Payer: Self-pay | Admitting: Family Medicine

## 2013-05-27 VITALS — BP 120/74 | HR 104 | Temp 97.9°F | Wt 218.0 lb

## 2013-05-27 DIAGNOSIS — J209 Acute bronchitis, unspecified: Secondary | ICD-10-CM

## 2013-05-27 MED ORDER — AZITHROMYCIN 250 MG PO TABS: ORAL_TABLET | ORAL | Status: DC

## 2013-05-27 MED ORDER — GUAIFENESIN-CODEINE 100-10 MG/5ML PO SYRP: 5.0000 mL | ORAL_SOLUTION | Freq: Two times a day (BID) | ORAL | Status: DC | PRN

## 2013-05-27 NOTE — Progress Notes (Signed)
   Subjective:    Patient ID: Kathleen Gibson, female    DOB: 1950/09/26, 62 y.o.   MRN: [id number]  HPI CC: cold sxs  Started feeling ill 05/19/2013.  Ongoing sxs over last 8 days.  Sore throat, tightness in chest, cough worse at night time but it's a dry cough.  Fatigue.  Rib pain from coughing.  + tinnitus, PNdrainage.  Congestion/tightness chest > head.  Some dizziness.  No fevers/chills, abd pain, nausea, mild headache.  No wheezing. So far has tried corcedin and mucinex and pseudophed. + sick contacts at home.  No smokers at home. No h/o asthma.  Past Medical History  Diagnosis Date  . Hypertension   . Diabetes      Review of Systems Per HPI    Objective:   Physical Exam  Nursing note and vitals reviewed. Constitutional: She appears well-developed and well-nourished. No distress.  HENT:  Head: Normocephalic and atraumatic.  Right Ear: Hearing, tympanic membrane, external ear and ear canal normal.  Left Ear: Hearing, tympanic membrane, external ear and ear canal normal.  Nose: No mucosal edema or rhinorrhea. Right sinus exhibits maxillary sinus tenderness. Right sinus exhibits no frontal sinus tenderness. Left sinus exhibits maxillary sinus tenderness. Left sinus exhibits no frontal sinus tenderness.  Mouth/Throat: Uvula is midline, oropharynx is clear and moist and mucous membranes are normal. No oropharyngeal exudate, posterior oropharyngeal edema, posterior oropharyngeal erythema or tonsillar abscesses.  Eyes: Conjunctivae and EOM are normal. Pupils are equal, round, and reactive to light. No scleral icterus.  Neck: Normal range of motion. Neck supple.  Cardiovascular: Regular rhythm, normal heart sounds and intact distal pulses.  Tachycardia present.   No murmur heard. Slight tachy  Pulmonary/Chest: Effort normal and breath sounds normal. No respiratory distress. She has no wheezes. She has no rales.  Lymphadenopathy:    She has no cervical adenopathy.  Skin: Skin is  warm and dry. No rash noted.       Assessment & Plan:

## 2013-05-27 NOTE — Assessment & Plan Note (Signed)
Treat with zpack given comorbidities to cover for atypical infection. cheratussin for cough at night time. Update if sxs persist or fail to improve. No wheezing and good air movement on lung exam today.

## 2013-05-27 NOTE — Progress Notes (Signed)
Pre-visit discussion using our clinic review tool. No additional management support is needed unless otherwise documented below in the visit note.  

## 2013-05-27 NOTE — Patient Instructions (Signed)
You have a bronchitis Use medication as prescribed: zpack for bronchitis and cheratussin codeine cough syrup for cough mainly at night time due to possibly sedation. Push fluids and plenty of rest. May continue plain mucinex or immediate release guaifenesin with plenty of fluids to help mobilize mucous Please return if you are not improving as expected, or if you have high fevers (>101.5) or difficulty swallowing or worsening productive cough. Call clinic with questions.  Good to see you today.

## 2013-05-28 ENCOUNTER — Other Ambulatory Visit: Payer: Self-pay | Admitting: Internal Medicine

## 2013-06-30 ENCOUNTER — Other Ambulatory Visit: Payer: Self-pay | Admitting: Family Medicine

## 2013-07-01 NOTE — Telephone Encounter (Signed)
Called to [facility]. 

## 2013-07-01 NOTE — Telephone Encounter (Signed)
Last office visit 05/27/2013 with Dr. Danise Mina.  Ok to refill?

## 2013-08-01 ENCOUNTER — Other Ambulatory Visit: Payer: Self-pay | Admitting: Family Medicine

## 2013-08-02 NOTE — Telephone Encounter (Signed)
Pt requesting medication refill. Last ov 03/2013 with no future appts scheduled. pls advise 

## 2013-08-02 NOTE — Telephone Encounter (Signed)
Spoke to pt and informed her Rx has been called in to requested pharmacy 

## 2013-08-08 ENCOUNTER — Ambulatory Visit: Payer: BC Managed Care – PPO

## 2013-08-13 ENCOUNTER — Encounter: Payer: Self-pay | Admitting: Radiology

## 2013-08-14 ENCOUNTER — Encounter: Payer: Self-pay | Admitting: Radiology

## 2013-08-14 ENCOUNTER — Ambulatory Visit (INDEPENDENT_AMBULATORY_CARE_PROVIDER_SITE_OTHER): Payer: BC Managed Care – PPO

## 2013-08-14 DIAGNOSIS — E538 Deficiency of other specified B group vitamins: Secondary | ICD-10-CM

## 2013-08-14 MED ORDER — CYANOCOBALAMIN 1000 MCG/ML IJ SOLN
1000.0000 ug | Freq: Once | INTRAMUSCULAR | Status: AC
  Administered 2013-08-14: 1000 ug via INTRAMUSCULAR

## 2013-08-29 ENCOUNTER — Other Ambulatory Visit: Payer: Self-pay | Admitting: Family Medicine

## 2013-08-30 NOTE — Telephone Encounter (Signed)
Pt requesting medication refill. Last ov 03/2013 with no future appts scheduled. pls advise 

## 2013-08-30 NOTE — Telephone Encounter (Signed)
Spoke to pt and informed her Rx has been faxed to requested pharmacy 

## 2013-09-10 ENCOUNTER — Encounter: Payer: Self-pay | Admitting: Family Medicine

## 2013-10-13 ENCOUNTER — Other Ambulatory Visit: Payer: Self-pay | Admitting: Family Medicine

## 2013-10-14 NOTE — Telephone Encounter (Signed)
Last visit 05/27/13. Ok to refill Ambien?

## 2013-10-14 NOTE — Telephone Encounter (Signed)
[facility]  Kathleen Sells, MA Student

## 2013-10-22 ENCOUNTER — Ambulatory Visit (INDEPENDENT_AMBULATORY_CARE_PROVIDER_SITE_OTHER): Payer: BC Managed Care – PPO

## 2013-10-22 ENCOUNTER — Telehealth: Payer: Self-pay

## 2013-10-22 DIAGNOSIS — E538 Deficiency of other specified B group vitamins: Secondary | ICD-10-CM

## 2013-10-22 MED ORDER — CYANOCOBALAMIN 1000 MCG/ML IJ SOLN
1000.0000 ug | Freq: Once | INTRAMUSCULAR | Status: AC
  Administered 2013-10-22: 1000 ug via INTRAMUSCULAR

## 2013-10-22 NOTE — Telephone Encounter (Signed)
Pt here today for Vit B 12 injection; pt states supposed to get B 12 shots monthly but difficult for pt to come in for B 12 shots. Last B 12 level done on 03/18/13. Dr Deborra Medina said she cks B 12 levels quarterly and pt could get B 12 level today. Pt said she is in a hurry and will get B 12 level when she comes for next B 12 injection. Pt will schedule B 12 lab next month. Pt given B 12 injection today as instructed.

## 2013-10-30 ENCOUNTER — Other Ambulatory Visit: Payer: Self-pay | Admitting: Family Medicine

## 2013-10-30 DIAGNOSIS — D510 Vitamin B12 deficiency anemia due to intrinsic factor deficiency: Secondary | ICD-10-CM

## 2013-10-30 DIAGNOSIS — E119 Type 2 diabetes mellitus without complications: Secondary | ICD-10-CM

## 2013-10-30 DIAGNOSIS — Z0000 Encounter for general adult medical examination without abnormal findings: Secondary | ICD-10-CM

## 2013-10-30 DIAGNOSIS — E785 Hyperlipidemia, unspecified: Secondary | ICD-10-CM

## 2013-11-06 ENCOUNTER — Other Ambulatory Visit: Payer: Self-pay | Admitting: Family Medicine

## 2013-11-06 ENCOUNTER — Other Ambulatory Visit (INDEPENDENT_AMBULATORY_CARE_PROVIDER_SITE_OTHER): Payer: BC Managed Care – PPO

## 2013-11-06 DIAGNOSIS — E119 Type 2 diabetes mellitus without complications: Secondary | ICD-10-CM

## 2013-11-06 DIAGNOSIS — Z0000 Encounter for general adult medical examination without abnormal findings: Secondary | ICD-10-CM

## 2013-11-06 DIAGNOSIS — D510 Vitamin B12 deficiency anemia due to intrinsic factor deficiency: Secondary | ICD-10-CM

## 2013-11-06 DIAGNOSIS — E785 Hyperlipidemia, unspecified: Secondary | ICD-10-CM

## 2013-11-06 LAB — CBC WITH DIFFERENTIAL/PLATELET
Basophils Absolute: 0 10*3/uL (ref 0.0–0.1)
Basophils Relative: 0.3 % (ref 0.0–3.0)
EOS ABS: 0 10*3/uL (ref 0.0–0.7)
EOS PCT: 0.3 % (ref 0.0–5.0)
HCT: 37.4 % (ref 36.0–46.0)
Hemoglobin: 12.6 g/dL (ref 12.0–15.0)
Lymphocytes Relative: 27.2 % (ref 12.0–46.0)
Lymphs Abs: 2.7 10*3/uL (ref 0.7–4.0)
MCHC: 33.6 g/dL (ref 30.0–36.0)
MCV: 85.7 fl (ref 78.0–100.0)
MONO ABS: 1.9 10*3/uL — AB (ref 0.1–1.0)
Monocytes Relative: 19.6 % — ABNORMAL HIGH (ref 3.0–12.0)
NEUTROS PCT: 52.6 % (ref 43.0–77.0)
Neutro Abs: 5.1 10*3/uL (ref 1.4–7.7)
PLATELETS: 121 10*3/uL — AB (ref 150.0–400.0)
RBC: 4.37 Mil/uL (ref 3.87–5.11)
RDW: 16.6 % — ABNORMAL HIGH (ref 11.5–15.5)
WBC: 9.7 10*3/uL (ref 4.0–10.5)

## 2013-11-06 LAB — TSH: TSH: 2.26 u[IU]/mL (ref 0.35–4.50)

## 2013-11-06 LAB — COMPREHENSIVE METABOLIC PANEL
ALBUMIN: 4 g/dL (ref 3.5–5.2)
ALT: 18 U/L (ref 0–35)
AST: 18 U/L (ref 0–37)
Alkaline Phosphatase: 62 U/L (ref 39–117)
BUN: 16 mg/dL (ref 6–23)
CHLORIDE: 98 meq/L (ref 96–112)
CO2: 28 mEq/L (ref 19–32)
Calcium: 9 mg/dL (ref 8.4–10.5)
Creatinine, Ser: 0.6 mg/dL (ref 0.4–1.2)
GFR: 105.56 mL/min (ref >60.00)
GLUCOSE: 184 mg/dL — AB (ref 70–99)
Potassium: 4 mEq/L (ref 3.5–5.1)
SODIUM: 136 meq/L (ref 135–145)
TOTAL PROTEIN: 7.5 g/dL (ref 6.0–8.3)
Total Bilirubin: 0.8 mg/dL (ref 0.2–1.2)

## 2013-11-06 LAB — LIPID PANEL
CHOL/HDL RATIO: 5
Cholesterol: 195 mg/dL (ref 0–200)
HDL: 42.8 mg/dL (ref >39.00)
LDL Cholesterol: 116 mg/dL — ABNORMAL HIGH (ref 0–99)
Triglycerides: 182 mg/dL — ABNORMAL HIGH (ref 0.0–149.0)
VLDL: 36.4 mg/dL (ref 0.0–40.0)

## 2013-11-06 LAB — VITAMIN B12: VITAMIN B 12: 832 pg/mL (ref 211–911)

## 2013-11-06 LAB — MICROALBUMIN / CREATININE URINE RATIO
Creatinine,U: 205.1 mg/dL
Microalb Creat Ratio: 0.7 mg/g (ref 0.0–30.0)
Microalb, Ur: 1.4 mg/dL (ref 0.0–1.9)

## 2013-11-06 LAB — HEMOGLOBIN A1C: HEMOGLOBIN A1C: 10.7 % — AB (ref 4.6–6.5)

## 2013-11-07 ENCOUNTER — Telehealth: Payer: Self-pay

## 2013-11-07 NOTE — Telephone Encounter (Signed)
Pt requesting a medication refill. Last ov 03/2013 with no future appts scheduled. pls advise

## 2013-11-07 NOTE — Telephone Encounter (Signed)
Spoke to pt and informed her Rx has been called in to requested pharmacy 

## 2013-11-07 NOTE — Telephone Encounter (Signed)
Relevant patient education assigned to patient using Emmi. ° °

## 2013-11-14 ENCOUNTER — Telehealth: Payer: Self-pay

## 2013-11-14 ENCOUNTER — Ambulatory Visit (INDEPENDENT_AMBULATORY_CARE_PROVIDER_SITE_OTHER): Payer: BC Managed Care – PPO | Admitting: Family Medicine

## 2013-11-14 ENCOUNTER — Encounter: Payer: Self-pay | Admitting: Family Medicine

## 2013-11-14 VITALS — BP 142/90 | HR 120 | Temp 98.0°F | Ht 61.75 in | Wt 218.2 lb

## 2013-11-14 DIAGNOSIS — E119 Type 2 diabetes mellitus without complications: Secondary | ICD-10-CM

## 2013-11-14 DIAGNOSIS — E1142 Type 2 diabetes mellitus with diabetic polyneuropathy: Secondary | ICD-10-CM

## 2013-11-14 LAB — HM DIABETES FOOT EXAM: HM Diabetic Foot Exam: NEGATIVE

## 2013-11-14 MED ORDER — METFORMIN HCL ER (OSM) 1000 MG PO TB24: ORAL_TABLET | ORAL | Status: DC

## 2013-11-14 MED ORDER — GLYBURIDE 5 MG PO TABS: ORAL_TABLET | ORAL | Status: DC

## 2013-11-14 NOTE — Progress Notes (Signed)
Subjective:    Patient ID: Kathleen Gibson, female    DOB: 07-27-1950, 63 y.o.   MRN: [id number]  Diabetes    63 yo very pleasant female with h/o DM, HTN, HLD here for follow up.    DM-  Deteriorated- a1c up to 10.7 this month. On Metformin 500 mg XR and Glyburide 2.5 mg daily. Does not check FSBS.  Under family stressors at home- admits to not eating right and often forgetting her medications. Denies any episodes of hypoglycemia. Lab Results  Component Value Date   HGBA1C 10.7* 11/06/2013  No tingling in her feet. Overdue for eye exam- has been two years.  HTN- BP has been well controlled on Prinzide 20/25 mg daily.  A little rushes and "stressed" today. Denies any HA, blurred vision, CP or SOB.     Obesity- h/o gastric bypass in 2004.  Lost 100 pounds, has regained 20 pounds.  Wt Readings from Last 3 Encounters:  11/14/13 218 lb 4 oz (98.998 kg)  05/27/13 218 lb (98.884 kg)  03/18/13 216 lb 1.9 oz (98.031 kg)    Patient Active Problem List   Diagnosis Date Noted  . Routine general medical examination at a health care facility 08/20/2012  . INSOMNIA, CHRONIC 10/06/2009  . POLYNEUROPATHY IN DIABETES 11/16/2007  . HYPERLIPIDEMIA 10/05/2007  . UNS ADVRS EFF UNS RX MEDICINAL&BIOLOGICAL SBSTNC 10/05/2007  . NECK AND BACK PAIN 09/08/2007  . ANEMIA, PERNICIOUS 03/02/2007  . DIABETES MELLITUS, TYPE II 01/23/2007  . OBESITY 12/27/2006  . HYPERTENSION 12/27/2006   Past Medical History  Diagnosis Date  . Hypertension   . Diabetes   . HLD (hyperlipidemia)   . Pernicious anemia    No past surgical history on file. History  Substance Use Topics  . Smoking status: Never Smoker   . Smokeless tobacco: Never Used  . Alcohol Use: No   No family history on file. No Known Allergies Current Outpatient Prescriptions on File Prior to Visit  Medication Sig Dispense Refill  . cyanocobalamin (,VITAMIN B-12,) 1000 MCG/ML injection Inject 1,000 mcg into the muscle every 30 (thirty)  days.      . fluocinonide-emollient (LIDEX-E) 0.05 % cream Apply topically 2 (two) times daily.  30 g  0  . glyBURIDE (DIABETA) 2.5 MG tablet TAKE 1 TABLET (2.5 MG TOTAL) BY MOUTH DAILY WITH BREAKFAST.  30 tablet  6  . guaiFENesin-codeine (ROBITUSSIN AC) 100-10 MG/5ML syrup Take 5 mLs by mouth 2 (two) times daily as needed for cough.  180 mL  0  . lisinopril-hydrochlorothiazide (PRINZIDE,ZESTORETIC) 20-25 MG per tablet TAKE ONE TABLET BY MOUTH EVERY DAY  90 tablet  3  . metFORMIN (GLUCOPHAGE-XR) 500 MG 24 hr tablet TAKE ONE TABLET BY MOUTH TWICE DAILY  180 tablet  3  . zolpidem (AMBIEN) 10 MG tablet TAKE 1 TABLET BY MOUTH AT BEDTIME AS NEEDED  30 tablet  0   No current facility-administered medications on file prior to visit.   The PMH, PSH, Social History, Family History, Medications, and allergies have been reviewed in [facility], and have been updated if relevant.   Review of Systems See HPI     Objective:   Physical Exam BP 142/90  Pulse 120  Temp(Src) 98 F (36.7 C) (Oral)  Ht 5' 1.75" (1.568 m)  Wt 218 lb 4 oz (98.998 kg)  BMI 40.27 kg/m2  SpO2 97%  General:  Obese ,well-nourished,in no acute distress; alert,appropriate and cooperative throughout examination Head:  normocephalic and atraumatic.   Eyes:  vision  grossly intact, pupils equal, pupils round, and pupils reactive to light.   Ears:  R ear normal and L ear normal.   Lungs:  Normal respiratory effort, chest expands symmetrically. Lungs are clear to auscultation, no crackles or wheezes. Heart:  Normal rate and regular rhythm. S1 and S2 normal without gallop, murmur, click, rub or other extra sounds. oriented X3 and gait normal.   Skin:  Intact without suspicious lesions or rashes Cervical Nodes:  No lymphadenopathy noted Axillary Nodes:  No palpable lymphadenopathy Psych:  Cognition and judgment appear intact. Alert and cooperative with normal attention span and concentration. No apparent delusions, illusions,  hallucinations Diabetic foot exam: Normal inspection No skin breakdown No calluses  Normal DP pulses Normal sensation to light touch and monofilament Nails normal      Assessment & Plan:

## 2013-11-14 NOTE — Telephone Encounter (Signed)
Spoke to pt and informed her Rx has been sent to requested pharmacy 

## 2013-11-14 NOTE — Telephone Encounter (Signed)
It should be 1 tablet xr 1000 mg daily.Rx sent.

## 2013-11-14 NOTE — Assessment & Plan Note (Signed)
>  25 minutes spent in face to face time with patient, >50% spent in counselling or coordination of care She is agreeable to increasing her medication- both Metformin to 1000 mg XR daily and glyburide to 5 mg daily. Also rx given to her (handwritten) for glucometer- I would like her checking FSBS three times weekly and when she has symptoms of hypoglycemia- we discussed these as well today.  She will look online for free glucometer and call us once she has name so we can call in test strips. Urine micro neg. On ACEI. DM foot exam today. Advised to make appt with optho. The patient indicates understanding of these issues and agrees with the plan.

## 2013-11-14 NOTE — Telephone Encounter (Signed)
Pt left v/m and request clarification of metformin 1000 mg taking one tab daily. Pt said instructions at pharmacy are take metformin 1000 mg taking one tab twice a day. Pt also wants new rx of metformin sent to [facility] [address]([facility]). pts med list does have metformin 1000 mg taking 1 tab twice a day.pt request cb.

## 2013-11-14 NOTE — Patient Instructions (Addendum)
We are changing your Metformin to 1000 mg daily and your glyburide to 5 mg daily.  Please make an eye doctor appointment.  Once you do get a meter, call us with the name so we can call in a prescription for test strips.

## 2013-11-14 NOTE — Progress Notes (Signed)
Pre visit review using our clinic review tool, if applicable. No additional management support is needed unless otherwise documented below in the visit note. 

## 2013-11-27 ENCOUNTER — Ambulatory Visit (INDEPENDENT_AMBULATORY_CARE_PROVIDER_SITE_OTHER): Payer: BC Managed Care – PPO

## 2013-11-27 DIAGNOSIS — E538 Deficiency of other specified B group vitamins: Secondary | ICD-10-CM

## 2013-11-27 MED ORDER — CYANOCOBALAMIN 1000 MCG/ML IJ SOLN
1000.0000 ug | Freq: Once | INTRAMUSCULAR | Status: AC
  Administered 2013-11-27: 1000 ug via INTRAMUSCULAR

## 2013-12-02 ENCOUNTER — Other Ambulatory Visit: Payer: Self-pay | Admitting: Internal Medicine

## 2013-12-09 ENCOUNTER — Other Ambulatory Visit: Payer: Self-pay | Admitting: Family Medicine

## 2013-12-10 NOTE — Telephone Encounter (Signed)
Spoke to pt and informed her Rx has been faxed to requested pharmacy 

## 2013-12-10 NOTE — Telephone Encounter (Signed)
Pt requesting medication refill. Last f/u appt 11/2013 with f/u appt 02/2014. pls advise

## 2013-12-31 ENCOUNTER — Ambulatory Visit (INDEPENDENT_AMBULATORY_CARE_PROVIDER_SITE_OTHER): Payer: BC Managed Care – PPO | Admitting: *Deleted

## 2013-12-31 DIAGNOSIS — E538 Deficiency of other specified B group vitamins: Secondary | ICD-10-CM

## 2013-12-31 MED ORDER — CYANOCOBALAMIN 1000 MCG/ML IJ SOLN
1000.0000 ug | Freq: Once | INTRAMUSCULAR | Status: AC
Start: 2013-12-31 — End: 2013-12-31
  Administered 2013-12-31: 1000 ug via INTRAMUSCULAR

## 2014-02-03 ENCOUNTER — Other Ambulatory Visit: Payer: Self-pay | Admitting: Family Medicine

## 2014-02-04 NOTE — Telephone Encounter (Signed)
Rx faxed to requested pharmacy 

## 2014-02-04 NOTE — Telephone Encounter (Signed)
Pt requesting medication refill. Last f/u appt 11/2013 with upcoming 02/2014 appt. pls advise

## 2014-02-10 ENCOUNTER — Other Ambulatory Visit: Payer: Self-pay | Admitting: Family Medicine

## 2014-02-10 DIAGNOSIS — E785 Hyperlipidemia, unspecified: Secondary | ICD-10-CM

## 2014-02-10 DIAGNOSIS — E1165 Type 2 diabetes mellitus with hyperglycemia: Secondary | ICD-10-CM

## 2014-02-10 DIAGNOSIS — IMO0002 Reserved for concepts with insufficient information to code with codable children: Secondary | ICD-10-CM

## 2014-02-11 ENCOUNTER — Ambulatory Visit: Payer: BC Managed Care – PPO

## 2014-02-19 ENCOUNTER — Other Ambulatory Visit: Payer: BC Managed Care – PPO

## 2014-02-20 ENCOUNTER — Other Ambulatory Visit (INDEPENDENT_AMBULATORY_CARE_PROVIDER_SITE_OTHER): Payer: BC Managed Care – PPO

## 2014-02-20 DIAGNOSIS — E785 Hyperlipidemia, unspecified: Secondary | ICD-10-CM

## 2014-02-20 DIAGNOSIS — IMO0002 Reserved for concepts with insufficient information to code with codable children: Secondary | ICD-10-CM

## 2014-02-20 DIAGNOSIS — E1165 Type 2 diabetes mellitus with hyperglycemia: Secondary | ICD-10-CM

## 2014-02-20 DIAGNOSIS — IMO0001 Reserved for inherently not codable concepts without codable children: Secondary | ICD-10-CM

## 2014-02-20 LAB — COMPREHENSIVE METABOLIC PANEL
ALK PHOS: 61 U/L (ref 39–117)
ALT: 20 U/L (ref 0–35)
AST: 20 U/L (ref 0–37)
Albumin: 3.8 g/dL (ref 3.5–5.2)
BUN: 12 mg/dL (ref 6–23)
CALCIUM: 8.8 mg/dL (ref 8.4–10.5)
CO2: 28 mEq/L (ref 19–32)
Chloride: 101 mEq/L (ref 96–112)
Creatinine, Ser: 0.7 mg/dL (ref 0.4–1.2)
GFR: 93.04 mL/min (ref >60.00)
GLUCOSE: 166 mg/dL — AB (ref 70–99)
Potassium: 4.5 mEq/L (ref 3.5–5.1)
Sodium: 137 mEq/L (ref 135–145)
Total Bilirubin: 0.5 mg/dL (ref 0.2–1.2)
Total Protein: 7.2 g/dL (ref 6.0–8.3)

## 2014-02-20 LAB — LIPID PANEL
CHOLESTEROL: 170 mg/dL (ref 0–200)
HDL: 32.1 mg/dL — ABNORMAL LOW (ref >39.00)
LDL Cholesterol: 105 mg/dL — ABNORMAL HIGH (ref 0–99)
NonHDL: 137.9
TRIGLYCERIDES: 165 mg/dL — AB (ref 0.0–149.0)
Total CHOL/HDL Ratio: 5
VLDL: 33 mg/dL (ref 0.0–40.0)

## 2014-02-20 LAB — HEMOGLOBIN A1C: Hgb A1c MFr Bld: 9.8 % — ABNORMAL HIGH (ref 4.6–6.5)

## 2014-02-24 ENCOUNTER — Encounter: Payer: Self-pay | Admitting: Family Medicine

## 2014-02-24 ENCOUNTER — Ambulatory Visit (INDEPENDENT_AMBULATORY_CARE_PROVIDER_SITE_OTHER): Payer: BC Managed Care – PPO | Admitting: Family Medicine

## 2014-02-24 VITALS — BP 140/72 | HR 105 | Temp 97.5°F | Wt 216.5 lb

## 2014-02-24 DIAGNOSIS — E785 Hyperlipidemia, unspecified: Secondary | ICD-10-CM

## 2014-02-24 DIAGNOSIS — IMO0001 Reserved for inherently not codable concepts without codable children: Secondary | ICD-10-CM

## 2014-02-24 DIAGNOSIS — I10 Essential (primary) hypertension: Secondary | ICD-10-CM

## 2014-02-24 DIAGNOSIS — E1165 Type 2 diabetes mellitus with hyperglycemia: Secondary | ICD-10-CM

## 2014-02-24 DIAGNOSIS — IMO0002 Reserved for concepts with insufficient information to code with codable children: Secondary | ICD-10-CM

## 2014-02-24 DIAGNOSIS — E538 Deficiency of other specified B group vitamins: Secondary | ICD-10-CM

## 2014-02-24 MED ORDER — FREESTYLE SYSTEM KIT: 1.0000 | PACK | Status: DC | PRN

## 2014-02-24 MED ORDER — METFORMIN HCL 500 MG PO TABS: 1000.0000 mg | ORAL_TABLET | Freq: Two times a day (BID) | ORAL | Status: DC

## 2014-02-24 MED ORDER — CYANOCOBALAMIN 1000 MCG/ML IJ SOLN
1000.0000 ug | Freq: Once | INTRAMUSCULAR | Status: AC
  Administered 2014-02-24: 1000 ug via INTRAMUSCULAR

## 2014-02-24 NOTE — Assessment & Plan Note (Signed)
Almost at goal for diabetic- she wants to work on diet.

## 2014-02-24 NOTE — Assessment & Plan Note (Signed)
Remains poorly controlled. Increase Metformin 1000 mg twice daily. Strongly advised endocrinology referral. She is refusing for now- wants to work on diet but will follow up in 3 months.  If remains, poorly controlled, I will insist on endo referral.

## 2014-02-24 NOTE — Assessment & Plan Note (Signed)
Almost goal for diabetic, gets some mild white coat HTN. Continue current rx for now.

## 2014-02-24 NOTE — Addendum Note (Signed)
Addended by: Modena Nunnery on: 02/24/2014 09:22 AM   Modules accepted: Orders

## 2014-02-24 NOTE — Progress Notes (Signed)
Pre visit review using our clinic review tool, if applicable. No additional management support is needed unless otherwise documented below in the visit note. 

## 2014-02-24 NOTE — Progress Notes (Signed)
Subjective:    Patient ID: Kathleen Gibson, female    DOB: 08/22/1950, 63 y.o.   MRN: [id number]  Diabetes She presents for her follow-up diabetic visit. She has type 2 diabetes mellitus.    63 yo very pleasant female with h/o DM, HTN, HLD here for follow up.    DM-  Iimproved but remains poorly controlled.   Increased her Metformin to 1000 mg XR( was very expensive for her) and Glyburide to 5 mg daily in 11/2013. Does not check FSBS.  Was visiting her kids and admits to eating high carb and sugary foods. Denies any episodes of hypoglycemia. Lab Results  Component Value Date   HGBA1C 9.8* 02/20/2014  No tingling in her feet. Overdue for eye exam- has been two years. Neg urine micro 11/06/13.   HTN- BP has been well controlled on Prinzide 20/25 mg daily.  Denies any HA, blurred vision, CP or SOB.     Obesity- h/o gastric bypass in 2004.  Lost 100 pounds, has regained 20 pounds.  Wt Readings from Last 3 Encounters:  02/24/14 216 lb 8 oz (98.204 kg)  11/14/13 218 lb 4 oz (98.998 kg)  05/27/13 218 lb (98.884 kg)    Patient Active Problem List   Diagnosis Date Noted  . Routine general medical examination at a health care facility 08/20/2012  . INSOMNIA, CHRONIC 10/06/2009  . POLYNEUROPATHY IN DIABETES 11/16/2007  . HYPERLIPIDEMIA 10/05/2007  . UNS ADVRS EFF UNS RX MEDICINAL&BIOLOGICAL SBSTNC 10/05/2007  . NECK AND BACK PAIN 09/08/2007  . ANEMIA, PERNICIOUS 03/02/2007  . Diabetes mellitus out of control 01/23/2007  . OBESITY 12/27/2006  . HYPERTENSION 12/27/2006   Past Medical History  Diagnosis Date  . Hypertension   . Diabetes   . HLD (hyperlipidemia)   . Pernicious anemia    No past surgical history on file. History  Substance Use Topics  . Smoking status: Never Smoker   . Smokeless tobacco: Never Used  . Alcohol Use: No   No family history on file. No Known Allergies Current Outpatient Prescriptions on File Prior to Visit  Medication Sig Dispense Refill  .  cyanocobalamin (,VITAMIN B-12,) 1000 MCG/ML injection Inject 1,000 mcg into the muscle every 30 (thirty) days.      [name] glyBURIDE (DIABETA) 5 MG tablet TAKE 1 TABLET BY MOUTH DAILY WITH BREAKFAST.  30 tablet  6  . lisinopril-hydrochlorothiazide (PRINZIDE,ZESTORETIC) 20-25 MG per tablet TAKE ONE TABLET BY MOUTH EVERY DAY  90 tablet  3  . lisinopril-hydrochlorothiazide (PRINZIDE,ZESTORETIC) 20-25 MG per tablet TAKE ONE TABLET BY MOUTH ONCE DAILY  90 tablet  0  . zolpidem (AMBIEN) 10 MG tablet Take 1 tablet (10 mg total) by mouth at bedtime as needed for sleep.  30 tablet  0   No current facility-administered medications on file prior to visit.   The PMH, PSH, Social History, Family History, Medications, and allergies have been reviewed in [facility], and have been updated if relevant.   Review of Systems See HPI No n/v/d No CP No blurred vision No episodes of hypoglycemia No SOB    Objective:   Physical Exam BP 144/72  Pulse 105  Temp(Src) 97.5 F (36.4 C) (Oral)  Wt 216 lb 8 oz (98.204 kg)  SpO2 97%  General:  Obese ,well-nourished,in no acute distress; alert,appropriate and cooperative throughout examination Head:  normocephalic and atraumatic.   Eyes:  vision grossly intact, pupils equal, pupils round, and pupils reactive to light.   Ears:  R ear normal and L  ear normal.   Lungs:  Normal respiratory effort, chest expands symmetrically. Lungs are clear to auscultation, no crackles or wheezes. Heart:  Normal rate and regular rhythm. S1 and S2 normal without gallop, murmur, click, rub or other extra sounds. oriented X3 and gait normal.   Skin:  Intact without suspicious lesions or rashes Cervical Nodes:  No lymphadenopathy noted Axillary Nodes:  No palpable lymphadenopathy Psych:  Cognition and judgment appear intact. Alert and cooperative with normal attention span and concentration. No apparent delusions, illusions, hallucinations      Assessment & Plan:

## 2014-02-24 NOTE — Patient Instructions (Addendum)
Great to see you. Please come see me in 3 months.  We are increasing your Metformin 500 mg - 2 tab by mouth twice daily.

## 2014-03-12 ENCOUNTER — Other Ambulatory Visit: Payer: Self-pay | Admitting: *Deleted

## 2014-03-12 MED ORDER — ZOLPIDEM TARTRATE 10 MG PO TABS: 10.0000 mg | ORAL_TABLET | Freq: Every evening | ORAL | Status: DC | PRN

## 2014-03-12 NOTE — Telephone Encounter (Signed)
Pt requesting medication refill. Last f/u appt 02/2014. Ok to fill per Dr Deborra Medina. Rx to be faxed to requested pharmacy before end of day,today.

## 2014-03-13 ENCOUNTER — Ambulatory Visit: Payer: BC Managed Care – PPO

## 2014-03-27 ENCOUNTER — Ambulatory Visit: Payer: BC Managed Care – PPO

## 2014-04-04 ENCOUNTER — Other Ambulatory Visit: Payer: Self-pay | Admitting: *Deleted

## 2014-04-04 MED ORDER — LISINOPRIL-HYDROCHLOROTHIAZIDE 20-25 MG PO TABS
ORAL_TABLET | ORAL | Status: DC
Start: 2014-04-04 — End: 2014-07-15

## 2014-04-10 ENCOUNTER — Other Ambulatory Visit: Payer: Self-pay

## 2014-04-10 MED ORDER — ZOLPIDEM TARTRATE 10 MG PO TABS: 10.0000 mg | ORAL_TABLET | Freq: Every evening | ORAL | Status: DC | PRN

## 2014-04-10 NOTE — Telephone Encounter (Signed)
Pt request refill zolpidem to [facility];Please advise. Pt request refill before Dr Hulen Shouts return.

## 2014-04-10 NOTE — Telephone Encounter (Signed)
Rx called in to requested pharmacy 

## 2014-04-10 NOTE — Telephone Encounter (Signed)
Ok to phone in [place] to be filled 04/11/14 or after

## 2014-04-15 ENCOUNTER — Ambulatory Visit (INDEPENDENT_AMBULATORY_CARE_PROVIDER_SITE_OTHER): Payer: BC Managed Care – PPO

## 2014-04-15 DIAGNOSIS — E538 Deficiency of other specified B group vitamins: Secondary | ICD-10-CM

## 2014-04-15 MED ORDER — CYANOCOBALAMIN 1000 MCG/ML IJ SOLN
1000.0000 ug | Freq: Once | INTRAMUSCULAR | Status: AC
Start: 2014-04-15 — End: 2014-04-15
  Administered 2014-04-15: 1000 ug via INTRAMUSCULAR

## 2014-05-15 ENCOUNTER — Other Ambulatory Visit: Payer: Self-pay | Admitting: *Deleted

## 2014-05-15 MED ORDER — ZOLPIDEM TARTRATE 10 MG PO TABS: 10.0000 mg | ORAL_TABLET | Freq: Every evening | ORAL | Status: DC | PRN

## 2014-05-15 NOTE — Telephone Encounter (Signed)
Ok to phone in ambien 

## 2014-05-15 NOTE — Telephone Encounter (Signed)
Rx called in to requested pharmacy 

## 2014-05-15 NOTE — Telephone Encounter (Signed)
Pt requesting medication refill. Last f/u appt 02/2014. pls advise 

## 2014-05-19 ENCOUNTER — Other Ambulatory Visit: Payer: Self-pay | Admitting: Family Medicine

## 2014-05-19 ENCOUNTER — Other Ambulatory Visit (INDEPENDENT_AMBULATORY_CARE_PROVIDER_SITE_OTHER): Payer: BC Managed Care – PPO

## 2014-05-19 DIAGNOSIS — E785 Hyperlipidemia, unspecified: Secondary | ICD-10-CM

## 2014-05-19 DIAGNOSIS — IMO0002 Reserved for concepts with insufficient information to code with codable children: Secondary | ICD-10-CM

## 2014-05-19 DIAGNOSIS — E1165 Type 2 diabetes mellitus with hyperglycemia: Secondary | ICD-10-CM

## 2014-05-19 LAB — COMPREHENSIVE METABOLIC PANEL
ALT: 19 U/L (ref 0–35)
AST: 16 U/L (ref 0–37)
Albumin: 4.2 g/dL (ref 3.5–5.2)
Alkaline Phosphatase: 54 U/L (ref 39–117)
BUN: 14 mg/dL (ref 6–23)
CALCIUM: 8.9 mg/dL (ref 8.4–10.5)
CHLORIDE: 102 meq/L (ref 96–112)
CO2: 27 meq/L (ref 19–32)
Creatinine, Ser: 0.7 mg/dL (ref 0.4–1.2)
GFR: 87.03 mL/min (ref >60.00)
Glucose, Bld: 143 mg/dL — ABNORMAL HIGH (ref 70–99)
Potassium: 4 mEq/L (ref 3.5–5.1)
SODIUM: 138 meq/L (ref 135–145)
TOTAL PROTEIN: 7.3 g/dL (ref 6.0–8.3)
Total Bilirubin: 0.9 mg/dL (ref 0.2–1.2)

## 2014-05-19 LAB — LIPID PANEL
Cholesterol: 185 mg/dL (ref 0–200)
HDL: 36.4 mg/dL — ABNORMAL LOW (ref >39.00)
LDL Cholesterol: 121 mg/dL — ABNORMAL HIGH (ref 0–99)
NONHDL: 148.6
Total CHOL/HDL Ratio: 5
Triglycerides: 140 mg/dL (ref 0.0–149.0)
VLDL: 28 mg/dL (ref 0.0–40.0)

## 2014-05-19 LAB — HEMOGLOBIN A1C: HEMOGLOBIN A1C: 8.8 % — AB (ref 4.6–6.5)

## 2014-05-26 ENCOUNTER — Encounter: Payer: Self-pay | Admitting: Family Medicine

## 2014-05-26 ENCOUNTER — Ambulatory Visit (INDEPENDENT_AMBULATORY_CARE_PROVIDER_SITE_OTHER): Payer: BC Managed Care – PPO | Admitting: Family Medicine

## 2014-05-26 VITALS — BP 124/70 | HR 104 | Temp 97.8°F | Wt 213.0 lb

## 2014-05-26 DIAGNOSIS — E785 Hyperlipidemia, unspecified: Secondary | ICD-10-CM

## 2014-05-26 DIAGNOSIS — I10 Essential (primary) hypertension: Secondary | ICD-10-CM

## 2014-05-26 DIAGNOSIS — E538 Deficiency of other specified B group vitamins: Secondary | ICD-10-CM

## 2014-05-26 DIAGNOSIS — IMO0002 Reserved for concepts with insufficient information to code with codable children: Secondary | ICD-10-CM

## 2014-05-26 DIAGNOSIS — E1165 Type 2 diabetes mellitus with hyperglycemia: Secondary | ICD-10-CM

## 2014-05-26 MED ORDER — CYANOCOBALAMIN 1000 MCG/ML IJ SOLN
1000.0000 ug | Freq: Once | INTRAMUSCULAR | Status: AC
  Administered 2014-05-26: 1000 ug via INTRAMUSCULAR

## 2014-05-26 MED ORDER — CYANOCOBALAMIN 1000 MCG/ML IJ SOLN: 1000.0000 ug | Freq: Once | INTRAMUSCULAR | Status: DC

## 2014-05-26 NOTE — Addendum Note (Signed)
Addended by: Modena Nunnery on: 05/26/2014 08:29 AM   Modules accepted: Orders

## 2014-05-26 NOTE — Progress Notes (Signed)
Subjective:    Patient ID: Kathleen Gibson, female    DOB: 03/21/1951, 63 y.o.   MRN: [id number]  HPI  63 yo very pleasant female with h/o DM, HTN, HLD here for follow up.    DM-  When I saw her in September, a1c was still not at goal- 9.8.  I advised endocrinology referral at that time but she refused--wanted to work on diet/lifestyle.  I did change her Metformin to 1000 mg twice daily.   Checks FSBS once a day- has ranged widely- 100s- 200s.  She did have one episodes of hypoglycemia.  Pneumovax 06/03/2008  a1c improved but still not at goal.  Has cut back on carbohydrates and exercising more frequently. Lab Results  Component Value Date   HGBA1C 8.8* 05/19/2014  No tingling in her feet. Overdue for eye exam- has been two years.  She does have an appt in January 2016.  Neg urine micro 11/06/13.   Lab Results  Component Value Date   CHOL 185 05/19/2014   HDL 36.40* 05/19/2014   LDLCALC 121* 05/19/2014   LDLDIRECT 149.3 08/20/2012   TRIG 140.0 05/19/2014   CHOLHDL 5 05/19/2014    HTN- BP has been well controlled on Prinzide 20/25 mg daily.  Denies any HA, blurred vision, CP or SOB.   Lab Results  Component Value Date   CREATININE 0.7 05/19/2014     Obesity- h/o gastric bypass in 2004.  Lost 100 pounds, has regained 20 pounds.  Wt Readings from Last 3 Encounters:  05/26/14 213 lb (96.616 kg)  02/24/14 216 lb 8 oz (98.204 kg)  11/14/13 218 lb 4 oz (98.998 kg)    Patient Active Problem List   Diagnosis Date Noted  . INSOMNIA, CHRONIC 10/06/2009  . POLYNEUROPATHY IN DIABETES 11/16/2007  . HLD (hyperlipidemia) 10/05/2007  . UNS ADVRS EFF UNS RX MEDICINAL&BIOLOGICAL SBSTNC 10/05/2007  . NECK AND BACK PAIN 09/08/2007  . ANEMIA, PERNICIOUS 03/02/2007  . Diabetes mellitus out of control 01/23/2007  . OBESITY 12/27/2006  . Essential hypertension 12/27/2006   Past Medical History  Diagnosis Date  . Hypertension   . Diabetes   . HLD (hyperlipidemia)   . Pernicious  anemia    No past surgical history on file. History  Substance Use Topics  . Smoking status: Never Smoker   . Smokeless tobacco: Never Used  . Alcohol Use: No   No family history on file. No Known Allergies Current Outpatient Prescriptions on File Prior to Visit  Medication Sig Dispense Refill  . glucose monitoring kit (FREESTYLE) monitoring kit 1 each by Does not apply route as needed for other. Use as directed 1 each 0  . glyBURIDE (DIABETA) 5 MG tablet TAKE 1 TABLET BY MOUTH DAILY WITH BREAKFAST. 30 tablet 6  . lisinopril-hydrochlorothiazide (PRINZIDE,ZESTORETIC) 20-25 MG per tablet TAKE ONE TABLET BY MOUTH EVERY DAY 90 tablet 0  . metFORMIN (GLUCOPHAGE) 500 MG tablet Take 2 tablets (1,000 mg total) by mouth 2 (two) times daily with a meal. 180 tablet 3  . zolpidem (AMBIEN) 10 MG tablet Take 1 tablet (10 mg total) by mouth at bedtime as needed for sleep. 30 tablet 0   No current facility-administered medications on file prior to visit.   The PMH, PSH, Social History, Family History, Medications, and allergies have been reviewed in [facility], and have been updated if relevant.   Review of Systems See HPI No n/v/d No CP No blurred vision No episodes of hypoglycemia No SOB    Objective:  Physical Exam BP 124/70 mmHg  Pulse 104  Temp(Src) 97.8 F (36.6 C) (Oral)  Wt 213 lb (96.616 kg)  SpO2 98%  General:  Obese ,well-nourished,in no acute distress; alert,appropriate and cooperative throughout examination Head:  normocephalic and atraumatic.   Eyes:  vision grossly intact, pupils equal, pupils round, and pupils reactive to light.   Lungs:  Normal respiratory effort, chest expands symmetrically. Lungs are clear to auscultation, no crackles or wheezes. Heart:  Slightly tachycardic and regular rhythm. S1 and S2 normal without gallop, murmur, click, rub or other extra sounds. oriented X3 and gait normal.   Skin:  Intact without suspicious lesions or rashes Psych:  Cognition and  judgment appear intact. Alert and cooperative with normal attention span and concentration. No apparent delusions, illusions, hallucinations      Assessment & Plan:

## 2014-05-26 NOTE — Assessment & Plan Note (Signed)
At goal for diabetic. She is on an ACEI.

## 2014-05-26 NOTE — Patient Instructions (Addendum)
Great to see you. Happy Holidays!  Please come back to see me in 3 months.

## 2014-05-26 NOTE — Assessment & Plan Note (Signed)
Improving.  She did come down an entire point which I did congratulate her for but still not at goal. Continue current rx. Still refusing endocrinology referral. Prevnar 13 today. Refusing Influenza vaccine.

## 2014-05-26 NOTE — Assessment & Plan Note (Signed)
Not at goal for a diabetic. Refusing statin.

## 2014-05-26 NOTE — Progress Notes (Signed)
Pre visit review using our clinic review tool, if applicable. No additional management support is needed unless otherwise documented below in the visit note. 

## 2014-05-27 ENCOUNTER — Telehealth: Payer: Self-pay | Admitting: Family Medicine

## 2014-05-27 NOTE — Telephone Encounter (Signed)
emmi emailed °

## 2014-06-11 ENCOUNTER — Other Ambulatory Visit: Payer: Self-pay | Admitting: *Deleted

## 2014-06-11 MED ORDER — ZOLPIDEM TARTRATE 10 MG PO TABS: 10.0000 mg | ORAL_TABLET | Freq: Every evening | ORAL | Status: DC | PRN

## 2014-06-11 NOTE — Telephone Encounter (Signed)
Rx called in to requested pharmacy 

## 2014-06-11 NOTE — Telephone Encounter (Signed)
Pt requesting medication refill. Last f/u appt 05/2014. pls advise

## 2014-06-16 ENCOUNTER — Telehealth: Payer: Self-pay

## 2014-06-16 NOTE — Telephone Encounter (Signed)
Spoke to pt and advised per Dr Deborra Medina. Pt states that she recently changed insurance companies and will contact them to verify which med is covered.

## 2014-06-16 NOTE — Telephone Encounter (Signed)
Pt left v/m; pt unable to get zolpidem refilled;  Spoke with pharmacist at [place] [place] back order on zolpidem 10 mg; pt [facility] substitute [facility].

## 2014-06-16 NOTE — Telephone Encounter (Signed)
Which rx is on her formulary?

## 2014-06-18 NOTE — Telephone Encounter (Signed)
Pt left v/m wanting new rx for zolpidem 10 mg sent to [facility]; spoke with Otila Kluver at [facility] and zolpidem 10 mg rx is still available for transfer to another pharmacy; spoke with Pam at [facility] and will request transfer of rx to [place]. Pt notified and pt will [facility] with [facility] later today.

## 2014-06-26 ENCOUNTER — Ambulatory Visit (INDEPENDENT_AMBULATORY_CARE_PROVIDER_SITE_OTHER): Payer: 59 | Admitting: *Deleted

## 2014-06-26 ENCOUNTER — Ambulatory Visit: Payer: BC Managed Care – PPO

## 2014-06-26 DIAGNOSIS — D519 Vitamin B12 deficiency anemia, unspecified: Secondary | ICD-10-CM

## 2014-06-26 MED ORDER — CYANOCOBALAMIN 1000 MCG/ML IJ SOLN
1000.0000 ug | Freq: Once | INTRAMUSCULAR | Status: AC
  Administered 2014-06-26: 1000 ug via INTRAMUSCULAR

## 2014-07-12 ENCOUNTER — Other Ambulatory Visit: Payer: Self-pay | Admitting: Family Medicine

## 2014-07-14 ENCOUNTER — Encounter: Payer: Self-pay | Admitting: Family Medicine

## 2014-07-15 MED ORDER — LISINOPRIL-HYDROCHLOROTHIAZIDE 20-25 MG PO TABS: ORAL_TABLET | ORAL | Status: DC

## 2014-07-15 MED ORDER — METFORMIN HCL 500 MG PO TABS: 1000.0000 mg | ORAL_TABLET | Freq: Two times a day (BID) | ORAL | Status: DC

## 2014-07-15 MED ORDER — GLYBURIDE 5 MG PO TABS: ORAL_TABLET | ORAL | Status: DC

## 2014-07-17 NOTE — Telephone Encounter (Signed)
Pt left v/m for refill of ambien to [facility]. Pt is out of med and pt said refill has to be done today. Pt request cb [id number]. Was ambien refilled from previous email note?

## 2014-07-17 NOTE — Telephone Encounter (Signed)
Tried to contact pt and left v/m requesting pt to cb.

## 2014-07-18 ENCOUNTER — Telehealth: Payer: Self-pay | Admitting: *Deleted

## 2014-07-18 MED ORDER — ZOLPIDEM TARTRATE 10 MG PO TABS: 10.0000 mg | ORAL_TABLET | Freq: Every evening | ORAL | Status: DC | PRN

## 2014-07-18 NOTE — Telephone Encounter (Signed)
Pt last f/u appt 05/2014

## 2014-07-18 NOTE — Telephone Encounter (Signed)
Pt is upset she is out of her [place] Mais and she started last week trying to get her [place] refilled.    She stated she cannot go all weekend with out her meds.    She needs this refilled today  [facility].  She would like a call to let her know when this has been called in  [phone]

## 2014-07-18 NOTE — Telephone Encounter (Signed)
Rx called in to requested pharmacy 

## 2014-07-18 NOTE — Telephone Encounter (Signed)
Yes ok to refill as requested. 

## 2014-07-29 ENCOUNTER — Ambulatory Visit (INDEPENDENT_AMBULATORY_CARE_PROVIDER_SITE_OTHER): Payer: 59 | Admitting: *Deleted

## 2014-07-29 DIAGNOSIS — E538 Deficiency of other specified B group vitamins: Secondary | ICD-10-CM

## 2014-07-29 MED ORDER — CYANOCOBALAMIN 1000 MCG/ML IJ SOLN
1000.0000 ug | Freq: Once | INTRAMUSCULAR | Status: AC
  Administered 2014-07-29: 1000 ug via INTRAMUSCULAR

## 2014-08-12 ENCOUNTER — Other Ambulatory Visit: Payer: Self-pay

## 2014-08-12 MED ORDER — ZOLPIDEM TARTRATE 10 MG PO TABS: 10.0000 mg | ORAL_TABLET | Freq: Every evening | ORAL | Status: DC | PRN

## 2014-08-12 NOTE — Telephone Encounter (Signed)
Pt left v/m requesting refill ambien to [facility]. Pt request cb. Pt last seen 05/26/14 and last #30 refill 07/18/14 for ambien.Please advise.

## 2014-08-12 NOTE — Telephone Encounter (Signed)
Rx called in to requested pharmacy 

## 2014-08-22 LAB — HM DIABETES EYE EXAM

## 2014-08-27 ENCOUNTER — Ambulatory Visit (INDEPENDENT_AMBULATORY_CARE_PROVIDER_SITE_OTHER): Payer: 59 | Admitting: Family Medicine

## 2014-08-27 ENCOUNTER — Encounter: Payer: Self-pay | Admitting: Family Medicine

## 2014-08-27 VITALS — BP 136/78 | HR 107 | Temp 97.7°F | Wt 212.8 lb

## 2014-08-27 DIAGNOSIS — I10 Essential (primary) hypertension: Secondary | ICD-10-CM

## 2014-08-27 DIAGNOSIS — E538 Deficiency of other specified B group vitamins: Secondary | ICD-10-CM

## 2014-08-27 DIAGNOSIS — G4700 Insomnia, unspecified: Secondary | ICD-10-CM

## 2014-08-27 DIAGNOSIS — D510 Vitamin B12 deficiency anemia due to intrinsic factor deficiency: Secondary | ICD-10-CM

## 2014-08-27 DIAGNOSIS — IMO0002 Reserved for concepts with insufficient information to code with codable children: Secondary | ICD-10-CM

## 2014-08-27 DIAGNOSIS — E669 Obesity, unspecified: Secondary | ICD-10-CM

## 2014-08-27 DIAGNOSIS — E785 Hyperlipidemia, unspecified: Secondary | ICD-10-CM

## 2014-08-27 DIAGNOSIS — E1165 Type 2 diabetes mellitus with hyperglycemia: Secondary | ICD-10-CM

## 2014-08-27 LAB — CBC WITH DIFFERENTIAL/PLATELET
BASOS PCT: 0.2 % (ref 0.0–3.0)
Basophils Absolute: 0 10*3/uL (ref 0.0–0.1)
Eosinophils Absolute: 0 10*3/uL (ref 0.0–0.7)
Eosinophils Relative: 0.2 % (ref 0.0–5.0)
HEMATOCRIT: 35.3 % — AB (ref 36.0–46.0)
HEMOGLOBIN: 11.8 g/dL — AB (ref 12.0–15.0)
Lymphocytes Relative: 26.6 % (ref 12.0–46.0)
Lymphs Abs: 2 10*3/uL (ref 0.7–4.0)
MCHC: 33.5 g/dL (ref 30.0–36.0)
MCV: 85.8 fl (ref 78.0–100.0)
MONO ABS: 1.5 10*3/uL — AB (ref 0.1–1.0)
NEUTROS ABS: 4 10*3/uL (ref 1.4–7.7)
Neutrophils Relative %: 53.4 % (ref 43.0–77.0)
Platelets: 143 10*3/uL — ABNORMAL LOW (ref 150.0–400.0)
RBC: 4.11 Mil/uL (ref 3.87–5.11)
RDW: 15.8 % — ABNORMAL HIGH (ref 11.5–15.5)
WBC: 7.6 10*3/uL (ref 4.0–10.5)

## 2014-08-27 LAB — COMPREHENSIVE METABOLIC PANEL
ALBUMIN: 4.4 g/dL (ref 3.5–5.2)
ALT: 16 U/L (ref 0–35)
AST: 15 U/L (ref 0–37)
Alkaline Phosphatase: 56 U/L (ref 39–117)
BILIRUBIN TOTAL: 0.6 mg/dL (ref 0.2–1.2)
BUN: 18 mg/dL (ref 6–23)
CO2: 30 meq/L (ref 19–32)
Calcium: 9.4 mg/dL (ref 8.4–10.5)
Chloride: 100 mEq/L (ref 96–112)
Creatinine, Ser: 0.71 mg/dL (ref 0.40–1.20)
GFR: 88.37 mL/min (ref >60.00)
GLUCOSE: 105 mg/dL — AB (ref 70–99)
Potassium: 4.3 mEq/L (ref 3.5–5.1)
SODIUM: 136 meq/L (ref 135–145)
TOTAL PROTEIN: 7.4 g/dL (ref 6.0–8.3)

## 2014-08-27 LAB — VITAMIN B12

## 2014-08-27 LAB — LIPID PANEL
Cholesterol: 178 mg/dL (ref 0–200)
HDL: 41.2 mg/dL (ref >39.00)
LDL Cholesterol: 110 mg/dL — ABNORMAL HIGH (ref 0–99)
NONHDL: 136.8
Total CHOL/HDL Ratio: 4
Triglycerides: 136 mg/dL (ref 0.0–149.0)
VLDL: 27.2 mg/dL (ref 0.0–40.0)

## 2014-08-27 LAB — HEMOGLOBIN A1C: HEMOGLOBIN A1C: 8.5 % — AB (ref 4.6–6.5)

## 2014-08-27 MED ORDER — CYANOCOBALAMIN 1000 MCG/ML IJ SOLN
1000.0000 ug | Freq: Once | INTRAMUSCULAR | Status: AC
  Administered 2014-08-27: 1000 ug via INTRAMUSCULAR

## 2014-08-27 NOTE — Progress Notes (Signed)
Subjective:    Patient ID: Kathleen Gibson, female    DOB: 05-May-1951, 64 y.o.   MRN: [id number]  HPI  64 yo very pleasant female with h/o DM, HTN, HLD here for follow up.   Doing well- going to [place] to stay with her grandkids for 6 weeks.  DM-  When I saw her in September, a1c was still not at goal- 9.8.  I advised endocrinology referral at that time but she refused--wanted to work on diet/lifestyle.  I did change her Metformin to 1000 mg twice daily and a1c did improve to 8.8 in 05/2014- continued to refuse endocrinology referral.   Checks FSBS once a day- has ranged widely- 100s- 180s.  She did have one episodes of hypoglycemia.  Pneumovax 06/03/2008  Lab Results  Component Value Date   HGBA1C 8.8* 05/19/2014  No tingling in her feet. Went for eye exam last week- per pt, no diabetic retinopathy.  Wt Readings from Last 3 Encounters:  08/27/14 212 lb 12 oz (96.503 kg)  05/26/14 213 lb (96.616 kg)  02/24/14 216 lb 8 oz (98.204 kg)    Neg urine micro 11/06/13.  LDL has not been at goal- refusing statin. Lab Results  Component Value Date   CHOL 185 05/19/2014   HDL 36.40* 05/19/2014   LDLCALC 121* 05/19/2014   LDLDIRECT 149.3 08/20/2012   TRIG 140.0 05/19/2014   CHOLHDL 5 05/19/2014    HTN- BP has been well controlled on Prinzide 20/25 mg daily.  Denies any HA, blurred vision, CP or SOB.   Lab Results  Component Value Date   CREATININE 0.7 05/19/2014    Vit b12 deficiency- Getting monthly vit b12 injections.  Feels her energy has improved.  Patient Active Problem List   Diagnosis Date Noted  . INSOMNIA, CHRONIC 10/06/2009  . POLYNEUROPATHY IN DIABETES 11/16/2007  . HLD (hyperlipidemia) 10/05/2007  . UNS ADVRS EFF UNS RX MEDICINAL&BIOLOGICAL SBSTNC 10/05/2007  . NECK AND BACK PAIN 09/08/2007  . ANEMIA, PERNICIOUS 03/02/2007  . Diabetes mellitus out of control 01/23/2007  . OBESITY 12/27/2006  . Essential hypertension 12/27/2006   Past Medical History   Diagnosis Date  . Hypertension   . Diabetes   . HLD (hyperlipidemia)   . Pernicious anemia    History reviewed. No pertinent past surgical history. History  Substance Use Topics  . Smoking status: Never Smoker   . Smokeless tobacco: Never Used  . Alcohol Use: No   History reviewed. No pertinent family history. No Known Allergies Current Outpatient Prescriptions on File Prior to Visit  Medication Sig Dispense Refill  . cyanocobalamin (,VITAMIN B-12,) 1000 MCG/ML injection Inject 1 mL (1,000 mcg total) into the muscle once. 1 mL 0  . glucose monitoring kit (FREESTYLE) monitoring kit 1 each by Does not apply route as needed for other. Use as directed 1 each 0  . glyBURIDE (DIABETA) 5 MG tablet TAKE 1 TABLET BY MOUTH DAILY WITH BREAKFAST. 90 tablet 1  . lisinopril-hydrochlorothiazide (PRINZIDE,ZESTORETIC) 20-25 MG per tablet TAKE ONE TABLET BY MOUTH EVERY DAY 90 tablet 2  . metFORMIN (GLUCOPHAGE) 500 MG tablet Take 2 tablets (1,000 mg total) by mouth 2 (two) times daily with a meal. 120 tablet 3  . zolpidem (AMBIEN) 10 MG tablet Take 1 tablet (10 mg total) by mouth at bedtime as needed for sleep. 30 tablet 0   No current facility-administered medications on file prior to visit.   The PMH, PSH, Social History, Family History, Medications, and allergies have been reviewed  in [facility], and have been updated if relevant.   Review of Systems See HPI No n/v/d No CP No blurred vision No episodes of hypoglycemia No SOB Denies feeling depressed or anxious No LE edema    Objective:   Physical Exam BP 136/78 mmHg  Pulse 107  Temp(Src) 97.7 F (36.5 C) (Oral)  Wt 212 lb 12 oz (96.503 kg)  SpO2 97%  General:  Obese ,well-nourished,in no acute distress; alert,appropriate and cooperative throughout examination Head:  normocephalic and atraumatic.   Eyes:  vision grossly intact, pupils equal, pupils round, and pupils reactive to light.   Lungs:  Normal respiratory effort, chest expands  symmetrically. Lungs are clear to auscultation, no crackles or wheezes. Heart:  Slightly tachycardic and regular rhythm. S1 and S2 normal without gallop, murmur, click, rub or other extra sounds. oriented X3 and gait normal.   Skin:  Intact without suspicious lesions or rashes Abd:  Soft, NT Ext:  No edema Psych:  Cognition and judgment appear intact. Alert and cooperative with normal attention span and concentration. No apparent delusions, illusions, hallucinations      Assessment & Plan:

## 2014-08-27 NOTE — Assessment & Plan Note (Signed)
Not at goal for diabetic but unfortunately refusing statin.

## 2014-08-27 NOTE — Assessment & Plan Note (Signed)
Well controlled on current rx. No changes made today. 

## 2014-08-27 NOTE — Progress Notes (Signed)
Pre visit review using our clinic review tool, if applicable. No additional management support is needed unless otherwise documented below in the visit note. 

## 2014-08-27 NOTE — Assessment & Plan Note (Signed)
Due for labs today. Has refused statin, endo referral. She is taking ACEI. Urine micro, eye exam and pneumococcal vaccine UTD.

## 2014-08-27 NOTE — Assessment & Plan Note (Signed)
IM B12 given today. 

## 2014-08-27 NOTE — Patient Instructions (Signed)
Good to see you. Have a safe trip and enjoy your grand kids. We will call you with your lab results and you can view them online.

## 2014-09-11 ENCOUNTER — Other Ambulatory Visit: Payer: Self-pay

## 2014-09-11 NOTE — Telephone Encounter (Signed)
Pt left v/m; pt in [place] and request refill zolpidem to [facility] [place], [place]. Pt last seen 08/27/2014.

## 2014-09-12 MED ORDER — ZOLPIDEM TARTRATE 10 MG PO TABS: 10.0000 mg | ORAL_TABLET | Freq: Every evening | ORAL | Status: DC | PRN

## 2014-09-15 NOTE — Telephone Encounter (Signed)
Patient left a voicemail stating that the pharmacy needs a written script and a phone call to confirm this script. [facility] [phone].

## 2014-09-15 NOTE — Telephone Encounter (Signed)
This was just filled 4/1. Will defer to PCP

## 2014-09-16 MED ORDER — ZOLPIDEM TARTRATE 10 MG PO TABS: 10.0000 mg | ORAL_TABLET | Freq: Every evening | ORAL | Status: DC | PRN

## 2014-09-16 NOTE — Telephone Encounter (Signed)
Medication called in to the pharmacy, mailed original paper to [place].

## 2014-09-16 NOTE — Addendum Note (Signed)
Addended by: Kyra Manges on: 09/16/2014 12:32 PM   Modules accepted: Orders

## 2014-09-16 NOTE — Telephone Encounter (Signed)
Patient left a voicemail and wants a call back that this has been taken care of.

## 2014-09-17 ENCOUNTER — Encounter: Payer: Self-pay | Admitting: Family Medicine

## 2014-10-23 ENCOUNTER — Other Ambulatory Visit: Payer: Self-pay

## 2014-10-23 MED ORDER — ZOLPIDEM TARTRATE 10 MG PO TABS: 10.0000 mg | ORAL_TABLET | Freq: Every evening | ORAL | Status: DC | PRN

## 2014-10-23 NOTE — Telephone Encounter (Signed)
Pt left v/m; pt is still in [place] and request [place] refill [facility] at [phone]. Pt last seen 08/27/14  And ambien last refilled # 30 on 09/16/14.Please advise. Dr Deborra Medina out of office until 10/27/14 and note sent Dr Deborra Medina will be off line until late 10/24/14. Will send to Dr Deborra Medina and Dr Danise Mina.

## 2014-10-24 NOTE — Telephone Encounter (Signed)
Rx called in to requested pharmacy 

## 2014-11-19 ENCOUNTER — Other Ambulatory Visit: Payer: Self-pay

## 2014-11-19 MED ORDER — ZOLPIDEM TARTRATE 10 MG PO TABS: 10.0000 mg | ORAL_TABLET | Freq: Every evening | ORAL | Status: DC | PRN

## 2014-11-19 NOTE — Telephone Encounter (Signed)
Pt left v/m requesting refill ambien to [facility]. Pt request cb when refilled.last seen 08/27/14; last refilled # 30 on 10/23/14.

## 2014-11-20 ENCOUNTER — Telehealth: Payer: Self-pay | Admitting: *Deleted

## 2014-11-20 NOTE — Telephone Encounter (Signed)
Spoke with patient about scheduling mammogram.  Patient was given number to [place].

## 2014-11-20 NOTE — Telephone Encounter (Signed)
Rx called in to requested pharmacy 

## 2014-11-25 ENCOUNTER — Ambulatory Visit (INDEPENDENT_AMBULATORY_CARE_PROVIDER_SITE_OTHER): Payer: 59 | Admitting: *Deleted

## 2014-11-25 DIAGNOSIS — Z9884 Bariatric surgery status: Secondary | ICD-10-CM

## 2014-11-25 LAB — VITAMIN B12: Vitamin B-12: 736 pg/mL (ref 211–911)

## 2014-11-25 MED ORDER — CYANOCOBALAMIN 1000 MCG/ML IJ SOLN
1000.0000 ug | Freq: Once | INTRAMUSCULAR | Status: AC
Start: 2014-11-25 — End: 2014-11-25
  Administered 2014-11-25: 1000 ug via INTRAMUSCULAR

## 2014-12-17 ENCOUNTER — Other Ambulatory Visit: Payer: Self-pay

## 2014-12-17 MED ORDER — ZOLPIDEM TARTRATE 10 MG PO TABS: 10.0000 mg | ORAL_TABLET | Freq: Every evening | ORAL | Status: DC | PRN

## 2014-12-17 NOTE — Telephone Encounter (Signed)
Ok to phone in [place] to be filled on or after 12/19/14

## 2014-12-17 NOTE — Telephone Encounter (Signed)
Pt left v/m requesting refill ambien to [facility]. Last seen 08/27/14 and rx last refilled # 30 on 11/19/14.

## 2014-12-17 NOTE — Telephone Encounter (Signed)
Rx called in to requested pharmacy 

## 2015-01-26 ENCOUNTER — Other Ambulatory Visit: Payer: Self-pay

## 2015-01-26 NOTE — Telephone Encounter (Signed)
Pt left v/m requesting refill ambien to [facility]. rx last refilled # 30 on 12/17/14 and last f/u appt on 08/27/14.Please advise.

## 2015-01-27 MED ORDER — ZOLPIDEM TARTRATE 10 MG PO TABS: 10.0000 mg | ORAL_TABLET | Freq: Every evening | ORAL | Status: DC | PRN

## 2015-01-27 NOTE — Telephone Encounter (Signed)
Rx called in to requested pharmacy 

## 2015-02-23 ENCOUNTER — Other Ambulatory Visit: Payer: Self-pay

## 2015-02-23 MED ORDER — ZOLPIDEM TARTRATE 10 MG PO TABS: 10.0000 mg | ORAL_TABLET | Freq: Every evening | ORAL | Status: DC | PRN

## 2015-02-23 NOTE — Telephone Encounter (Signed)
Pt left v/m requesting refill [facility]; rx last refilled # 30 on 01/27/15; pt last seen 08/27/14.

## 2015-02-23 NOTE — Telephone Encounter (Signed)
Rx called in as directed.   

## 2015-03-19 ENCOUNTER — Encounter: Payer: Self-pay | Admitting: Family Medicine

## 2015-03-19 ENCOUNTER — Ambulatory Visit (INDEPENDENT_AMBULATORY_CARE_PROVIDER_SITE_OTHER): Payer: 59 | Admitting: Family Medicine

## 2015-03-19 VITALS — BP 126/80 | HR 115 | Temp 97.8°F | Wt 211.5 lb

## 2015-03-19 DIAGNOSIS — E785 Hyperlipidemia, unspecified: Secondary | ICD-10-CM | POA: Diagnosis not present

## 2015-03-19 DIAGNOSIS — IMO0002 Reserved for concepts with insufficient information to code with codable children: Secondary | ICD-10-CM

## 2015-03-19 DIAGNOSIS — E1142 Type 2 diabetes mellitus with diabetic polyneuropathy: Secondary | ICD-10-CM

## 2015-03-19 DIAGNOSIS — I10 Essential (primary) hypertension: Secondary | ICD-10-CM | POA: Diagnosis not present

## 2015-03-19 DIAGNOSIS — Z0000 Encounter for general adult medical examination without abnormal findings: Secondary | ICD-10-CM | POA: Diagnosis not present

## 2015-03-19 DIAGNOSIS — D510 Vitamin B12 deficiency anemia due to intrinsic factor deficiency: Secondary | ICD-10-CM | POA: Diagnosis not present

## 2015-03-19 DIAGNOSIS — Z01419 Encounter for gynecological examination (general) (routine) without abnormal findings: Secondary | ICD-10-CM

## 2015-03-19 DIAGNOSIS — E1165 Type 2 diabetes mellitus with hyperglycemia: Secondary | ICD-10-CM | POA: Diagnosis not present

## 2015-03-19 DIAGNOSIS — Z23 Encounter for immunization: Secondary | ICD-10-CM | POA: Diagnosis not present

## 2015-03-19 LAB — MICROALBUMIN / CREATININE URINE RATIO
Creatinine,U: 93.8 mg/dL
MICROALB/CREAT RATIO: 0.7 mg/g (ref 0.0–30.0)

## 2015-03-19 LAB — COMPREHENSIVE METABOLIC PANEL
ALBUMIN: 4.2 g/dL (ref 3.5–5.2)
ALT: 16 U/L (ref 0–35)
AST: 13 U/L (ref 0–37)
Alkaline Phosphatase: 61 U/L (ref 39–117)
BUN: 16 mg/dL (ref 6–23)
CHLORIDE: 99 meq/L (ref 96–112)
CO2: 29 mEq/L (ref 19–32)
CREATININE: 0.8 mg/dL (ref 0.40–1.20)
Calcium: 9.7 mg/dL (ref 8.4–10.5)
GFR: 76.86 mL/min (ref >60.00)
GLUCOSE: 285 mg/dL — AB (ref 70–99)
Potassium: 4.7 mEq/L (ref 3.5–5.1)
SODIUM: 137 meq/L (ref 135–145)
TOTAL PROTEIN: 7.6 g/dL (ref 6.0–8.3)
Total Bilirubin: 0.4 mg/dL (ref 0.2–1.2)

## 2015-03-19 LAB — LIPID PANEL
CHOL/HDL RATIO: 4
CHOLESTEROL: 168 mg/dL (ref 0–200)
HDL: 37.4 mg/dL — ABNORMAL LOW (ref >39.00)
NONHDL: 130.23
Triglycerides: 268 mg/dL — ABNORMAL HIGH (ref 0.0–149.0)
VLDL: 53.6 mg/dL — ABNORMAL HIGH (ref 0.0–40.0)

## 2015-03-19 LAB — CBC WITH DIFFERENTIAL/PLATELET
Basophils Absolute: 0 10*3/uL (ref 0.0–0.1)
Basophils Relative: 0.3 % (ref 0.0–3.0)
EOS ABS: 0 10*3/uL (ref 0.0–0.7)
Eosinophils Relative: 0.2 % (ref 0.0–5.0)
HCT: 35.7 % — ABNORMAL LOW (ref 36.0–46.0)
HEMOGLOBIN: 11.8 g/dL — AB (ref 12.0–15.0)
Lymphocytes Relative: 26.1 % (ref 12.0–46.0)
Lymphs Abs: 2.2 10*3/uL (ref 0.7–4.0)
MCHC: 33.2 g/dL (ref 30.0–36.0)
MCV: 87.2 fl (ref 78.0–100.0)
MONO ABS: 1.3 10*3/uL — AB (ref 0.1–1.0)
Monocytes Relative: 15.2 % — ABNORMAL HIGH (ref 3.0–12.0)
Neutro Abs: 4.9 10*3/uL (ref 1.4–7.7)
Neutrophils Relative %: 58.2 % (ref 43.0–77.0)
Platelets: 95 10*3/uL — ABNORMAL LOW (ref 150.0–400.0)
RBC: 4.1 Mil/uL (ref 3.87–5.11)
RDW: 15.5 % (ref 11.5–15.5)
WBC: 8.5 10*3/uL (ref 4.0–10.5)

## 2015-03-19 LAB — HEMOGLOBIN A1C: HEMOGLOBIN A1C: 9.2 % — AB (ref 4.6–6.5)

## 2015-03-19 LAB — VITAMIN B12

## 2015-03-19 LAB — LDL CHOLESTEROL, DIRECT: LDL DIRECT: 95 mg/dL

## 2015-03-19 LAB — TSH: TSH: 1.66 u[IU]/mL (ref 0.35–4.50)

## 2015-03-19 MED ORDER — LISINOPRIL-HYDROCHLOROTHIAZIDE 20-25 MG PO TABS: ORAL_TABLET | ORAL | Status: DC

## 2015-03-19 MED ORDER — SITAGLIPTIN PHOSPHATE 100 MG PO TABS: 100.0000 mg | ORAL_TABLET | Freq: Every day | ORAL | Status: DC

## 2015-03-19 MED ORDER — CYANOCOBALAMIN 1000 MCG/ML IJ SOLN
1000.0000 ug | Freq: Once | INTRAMUSCULAR | Status: AC
  Administered 2015-03-19: 1000 ug via INTRAMUSCULAR

## 2015-03-19 MED ORDER — METFORMIN HCL 500 MG PO TABS
1000.0000 mg | ORAL_TABLET | Freq: Two times a day (BID) | ORAL | Status: DC
Start: 2015-03-19 — End: 2015-08-20

## 2015-03-19 MED ORDER — GLYBURIDE 5 MG PO TABS
ORAL_TABLET | ORAL | Status: DC
Start: 2015-03-19 — End: 2015-07-07

## 2015-03-19 NOTE — Progress Notes (Signed)
Subjective:    Patient ID: Kathleen Gibson, female    DOB: Feb 17, 1951, 64 y.o.   MRN: [id number]  HPI  64 yo very pleasant female with h/o DM, HTN, HLD here for follow up.    DM- has been out of control and has refused endocrinology referral multiple times.   Lab Results  Component Value Date   HGBA1C 8.5* 08/27/2014    Currently taking Metformin to 1000 mg twice daily and glyburide 5 mg daily .   Checks FSBS once a day- has ranged widely- . 60s-250  She has had several episodes hypoglycemia. She thinks this is due to glyburide so often skips glyburide doses.   Pneumovax 06/03/2008 Eye exam UTD. Lab Results  Component Value Date   HGBA1C 8.5* 08/27/2014  she is having more tingling in her feet.   Wt Readings from Last 3 Encounters:  03/19/15 211 lb 8 oz (95.936 kg)  08/27/14 212 lb 12 oz (96.503 kg)  05/26/14 213 lb (96.616 kg)    Neg urine micro 11/06/13.  LDL has not been at goal- refusing statin. Lab Results  Component Value Date   CHOL 178 08/27/2014   HDL 41.20 08/27/2014   LDLCALC 110* 08/27/2014   LDLDIRECT 149.3 08/20/2012   TRIG 136.0 08/27/2014   CHOLHDL 4 08/27/2014    HTN- BP has been well controlled on Prinzide 20/25 mg daily.  Denies any HA, blurred vision, CP or SOB.   Lab Results  Component Value Date   CREATININE 0.71 08/27/2014    Vit b12 deficiency- Getting monthly vit b12 injections.  Feels her energy has improved. Due for injection today.  Patient Active Problem List   Diagnosis Date Noted  . INSOMNIA, CHRONIC 10/06/2009  . Diabetic polyneuropathy ([place]) 11/16/2007  . HLD (hyperlipidemia) 10/05/2007  . UNS ADVRS EFF UNS RX MEDICINAL&BIOLOGICAL SBSTNC 10/05/2007  . ANEMIA, PERNICIOUS 03/02/2007  . Diabetes mellitus out of control ([place]) 01/23/2007  . Obesity 12/27/2006  . Essential hypertension 12/27/2006   Past Medical History  Diagnosis Date  . Hypertension   . Diabetes ([place])   . HLD (hyperlipidemia)   . Pernicious anemia    No  past surgical history on file. Social History  Substance Use Topics  . Smoking status: Never Smoker   . Smokeless tobacco: Never Used  . Alcohol Use: No   No family history on file. No Known Allergies Current Outpatient Prescriptions on File Prior to Visit  Medication Sig Dispense Refill  . cyanocobalamin (,VITAMIN B-12,) 1000 MCG/ML injection Inject 1 mL (1,000 mcg total) into the muscle once. 1 mL 0  . glucose monitoring kit (FREESTYLE) monitoring kit 1 each by Does not apply route as needed for other. Use as directed 1 each 0  . zolpidem (AMBIEN) 10 MG tablet Take 1 tablet (10 mg total) by mouth at bedtime as needed for sleep. 30 tablet 0   No current facility-administered medications on file prior to visit.   The PMH, PSH, Social History, Family History, Medications, and allergies have been reviewed in [facility], and have been updated if relevant.   Review of Systems  Constitutional: Negative.   HENT: Negative.   Eyes: Negative.   Respiratory: Negative.   Cardiovascular: Negative.   Gastrointestinal: Negative.   Endocrine: Negative.   Genitourinary: Negative.   Musculoskeletal: Negative.   Skin: Negative.   Allergic/Immunologic: Negative.   Neurological: Positive for numbness. Negative for dizziness.  All other systems reviewed and are negative.      Objective:  Physical Exam BP 126/80 mmHg  Pulse 115  Temp(Src) 97.8 F (36.6 C) (Oral)  Wt 211 lb 8 oz (95.936 kg)  SpO2 97%  General:  Obese ,well-nourished,in no acute distress; alert,appropriate and cooperative throughout examination Head:  normocephalic and atraumatic.   Eyes:  vision grossly intact, pupils equal, pupils round, and pupils reactive to light.   Lungs:  Normal respiratory effort, chest expands symmetrically. Lungs are clear to auscultation, no crackles or wheezes. Heart:  Slightly tachycardic and regular rhythm. S1 and S2 normal without gallop, murmur, click, rub or other extra sounds. oriented X3 and  gait normal.   Skin:  Intact without suspicious lesions or rashes Abd:  Soft, NT Ext:  No edema Psych:  Cognition and judgment appear intact. Alert and cooperative with normal attention span and concentration. No apparent delusions, illusions, hallucinations      Assessment & Plan:   

## 2015-03-19 NOTE — Patient Instructions (Signed)
Great to see you. Stop taking Glyburide, start taking Januvia 100 mg daily, continue Metformin at current dose.  Come see me again in 3 months.

## 2015-03-19 NOTE — Assessment & Plan Note (Addendum)
Has not been well controlled.  Not tolerating glyburide.  D/c glyburide.  Start Januvia 100 mg daily. Recheck labs today. Prevnar 13 given today. Urine micro today as well. Follow up in 3 months.  Orders Placed This Encounter  Procedures  . Hemoglobin A1c  . Microalbumin / creatinine urine ratio  . CBC with Differential/Platelet  . Comprehensive metabolic panel  . Lipid panel  . TSH  . HIV antibody (with reflex)  . Hepatitis C Antibody  . Vitamin B12

## 2015-03-19 NOTE — Assessment & Plan Note (Signed)
At goal for diabetic. Continue current dose of ACEI.

## 2015-03-19 NOTE — Addendum Note (Signed)
Addended by: Modena Nunnery on: 03/19/2015 08:45 AM   Modules accepted: Orders

## 2015-03-19 NOTE — Assessment & Plan Note (Signed)
IM B12 today. 

## 2015-03-19 NOTE — Assessment & Plan Note (Signed)
Due for labs. Has not been at goal for a diabetic but has refused rx multiple times.

## 2015-03-19 NOTE — Progress Notes (Signed)
Pre visit review using our clinic review tool, if applicable. No additional management support is needed unless otherwise documented below in the visit note. 

## 2015-03-20 ENCOUNTER — Other Ambulatory Visit: Payer: Self-pay | Admitting: Family Medicine

## 2015-03-20 ENCOUNTER — Encounter: Payer: Self-pay | Admitting: Family Medicine

## 2015-03-20 DIAGNOSIS — D696 Thrombocytopenia, unspecified: Secondary | ICD-10-CM

## 2015-03-20 LAB — HEPATITIS C ANTIBODY: HCV AB: NEGATIVE

## 2015-03-20 LAB — HIV ANTIBODY (ROUTINE TESTING W REFLEX): HIV 1&2 Ab, 4th Generation: NONREACTIVE

## 2015-03-23 ENCOUNTER — Other Ambulatory Visit: Payer: Self-pay | Admitting: Family Medicine

## 2015-03-23 ENCOUNTER — Telehealth: Payer: Self-pay | Admitting: *Deleted

## 2015-03-23 NOTE — Telephone Encounter (Signed)
Pt left v/m requesting refill ambien to [facility] [address]; rx last refilled # 30 on 02/23/15; pt last seen 03/19/15.

## 2015-03-23 NOTE — Telephone Encounter (Signed)
Form received indicating PA required for Januvia. Form completed and faxed back as requested;awaiting response

## 2015-03-24 MED ORDER — ZOLPIDEM TARTRATE 10 MG PO TABS: 10.0000 mg | ORAL_TABLET | Freq: Every evening | ORAL | Status: DC | PRN

## 2015-03-24 NOTE — Telephone Encounter (Signed)
Rx called to pharmacy as instructed. 

## 2015-03-24 NOTE — Telephone Encounter (Signed)
Ok to refill as requested 

## 2015-03-26 MED ORDER — LINAGLIPTIN 5 MG PO TABS: 5.0000 mg | ORAL_TABLET | Freq: Every day | ORAL | Status: DC

## 2015-03-26 NOTE — Telephone Encounter (Signed)
Fax received today indicating denial for pts Januvia. Fax states pt must first try  Nesina, Tradjenta, or Onglyza for 67mos, with failed results.

## 2015-03-26 NOTE — Telephone Encounter (Signed)
Noted.  Please let pt know.  eRx sent for Tradjenta.

## 2015-03-26 NOTE — Addendum Note (Signed)
Addended by: Lucille Passy on: 03/26/2015 01:45 PM   Modules accepted: Orders, Medications

## 2015-03-26 NOTE — Telephone Encounter (Signed)
Spoke to pt and advised. States that the med is $40 and she is going to do some research and contact office back with decision

## 2015-03-30 NOTE — Telephone Encounter (Signed)
Pt left v/m as FYI to Dr Deborra Medina; pt purchased Tradjenta and will start med 03/30/15.

## 2015-04-20 ENCOUNTER — Other Ambulatory Visit: Payer: Self-pay

## 2015-04-20 NOTE — Telephone Encounter (Signed)
Pt left v/m requesting refill ambien to [facility]. rx last refilled # 30 on 03/24/15 and pt last seen 03/19/15.

## 2015-04-21 MED ORDER — ZOLPIDEM TARTRATE 10 MG PO TABS: 10.0000 mg | ORAL_TABLET | Freq: Every evening | ORAL | Status: DC | PRN

## 2015-04-21 NOTE — Telephone Encounter (Signed)
Rx called in to requested pharmacy 

## 2015-04-24 ENCOUNTER — Encounter: Payer: Self-pay | Admitting: Hematology

## 2015-04-24 ENCOUNTER — Ambulatory Visit (HOSPITAL_BASED_OUTPATIENT_CLINIC_OR_DEPARTMENT_OTHER): Payer: 59

## 2015-04-24 ENCOUNTER — Ambulatory Visit (HOSPITAL_BASED_OUTPATIENT_CLINIC_OR_DEPARTMENT_OTHER): Payer: 59 | Admitting: Hematology

## 2015-04-24 ENCOUNTER — Telehealth: Payer: Self-pay | Admitting: Hematology

## 2015-04-24 VITALS — BP 133/82 | HR 123 | Temp 97.6°F | Resp 20 | Ht 61.0 in | Wt 207.0 lb

## 2015-04-24 DIAGNOSIS — D649 Anemia, unspecified: Secondary | ICD-10-CM | POA: Diagnosis not present

## 2015-04-24 DIAGNOSIS — D696 Thrombocytopenia, unspecified: Secondary | ICD-10-CM

## 2015-04-24 DIAGNOSIS — D510 Vitamin B12 deficiency anemia due to intrinsic factor deficiency: Secondary | ICD-10-CM

## 2015-04-24 LAB — LACTATE DEHYDROGENASE (CC13): LDH: 159 U/L (ref 125–245)

## 2015-04-24 LAB — CBC & DIFF AND RETIC
BASO%: 0.2 % (ref 0.0–2.0)
Basophils Absolute: 0 10*3/uL (ref 0.0–0.1)
EOS ABS: 0 10*3/uL (ref 0.0–0.5)
EOS%: 0.3 % (ref 0.0–7.0)
HCT: 36.3 % (ref 34.8–46.6)
HEMOGLOBIN: 11.9 g/dL (ref 11.6–15.9)
IMMATURE RETIC FRACT: 5 % (ref 1.60–10.00)
LYMPH#: 2.7 10*3/uL (ref 0.9–3.3)
LYMPH%: 26.2 % (ref 14.0–49.7)
MCH: 28.7 pg (ref 25.1–34.0)
MCHC: 32.8 g/dL (ref 31.5–36.0)
MCV: 87.5 fL (ref 79.5–101.0)
MONO#: 1.8 10*3/uL — AB (ref 0.1–0.9)
MONO%: 16.9 % — ABNORMAL HIGH (ref 0.0–14.0)
NEUT%: 56.4 % (ref 38.4–76.8)
NEUTROS ABS: 5.9 10*3/uL (ref 1.5–6.5)
Platelets: 124 10*3/uL — ABNORMAL LOW (ref 145–400)
RBC: 4.15 10*6/uL (ref 3.70–5.45)
RDW: 14.7 % — AB (ref 11.2–14.5)
RETIC %: 1.32 % (ref 0.70–2.10)
Retic Ct Abs: 54.78 10*3/uL (ref 33.70–90.70)
WBC: 10.4 10*3/uL — AB (ref 3.9–10.3)

## 2015-04-24 LAB — MORPHOLOGY
PLT EST: DECREASED
RBC Comments: NORMAL

## 2015-04-24 LAB — IRON AND TIBC CHCC
%SAT: 22 % (ref 21–57)
IRON: 91 ug/dL (ref 41–142)
TIBC: 420 ug/dL (ref 236–444)
UIBC: 329 ug/dL (ref 120–384)

## 2015-04-24 LAB — FERRITIN CHCC: FERRITIN: 25 ng/mL (ref 9–269)

## 2015-04-24 NOTE — Progress Notes (Signed)
[place] [facility]  Telephone:([phone]) [phone] [phone]) [phone]  Clinic New consult Note   Patient Care Team: Kathleen Passy, MD as PCP - General (Family Medicine) 04/24/2015  Referring physician:Talia Deborra Medina, MD  CHIEF COMPLAINTS/PURPOSE OF CONSULTATION:  Thrombocytopenia   HISTORY OF PRESENTING ILLNESS:  Kathleen Gibson 64 y.o. female with past medical history of diabetes, hypertension, gastric bypass surgery and pernicious anemia, is here because of thrombocytopenia.  She had a normal CBC in 2009. According to our Epic records, She has had mild thrombocytopenia since 2014, with platelet in the range of 120-140K. she has a routine lab work we'll months ago, which showed platelet 95K, and slight anemia with hemoglobin 11.8. White count was normal. She was referred for further workup.   She feels well overall, has been mild fatigue,  She denied any bleeding episodes including hematochezia, melana, hemoptysis, hematuria or epitaxis. No mucosal bleeding. Occasionally she has mild bruises on her arms.   She denies recent chest pain on exertion, shortness of breath on minimal exertion, pre-syncopal episodes, or palpitations. Her never had colonoscopy   She had no prior history or diagnosis of cancer. Her last mammogram was 2-3 years ago  She denies any pica and eats a variety of diet. She never donated blood, she received  blood transfusion after her gastric bypass surgery  She does not drink alcohol, not taking other over-the-counter medication or herbal supplement. Denies history of liver disease or chronic infection such as hepatitis or HIV.  MEDICAL HISTORY:  Past Medical History  Diagnosis Date  . Hypertension   . Diabetes ([place])   . HLD (hyperlipidemia)   . Pernicious anemia     SURGICAL HISTORY: History reviewed. No pertinent past surgical history.  SOCIAL HISTORY: Social History   Social History  . Marital Status: Divorced    Spouse Name: N/A  . Number of Children:  N/A  . Years of Education: N/A   Occupational History  . Not on file.   Social History Main Topics  . Smoking status: Never Smoker   . Smokeless tobacco: Never Used  . Alcohol Use: No  . Drug Use: No  . Sexual Activity: Not on file   Other Topics Concern  . Not on file   Social History Narrative    FAMILY HISTORY: History reviewed. No pertinent family history.  ALLERGIES:  has No Known Allergies.  MEDICATIONS:  Current Outpatient Prescriptions  Medication Sig Dispense Refill  . cyanocobalamin (,VITAMIN B-12,) 1000 MCG/ML injection Inject 1 mL (1,000 mcg total) into the muscle once. 1 mL 0  . glucose monitoring kit (FREESTYLE) monitoring kit 1 each by Does not apply route as needed for other. Use as directed 1 each 0  . linagliptin (TRADJENTA) 5 MG TABS tablet Take 1 tablet (5 mg total) by mouth daily. 30 tablet 3  . lisinopril-hydrochlorothiazide (PRINZIDE,ZESTORETIC) 20-25 MG tablet TAKE ONE TABLET BY MOUTH EVERY DAY 90 tablet 2  . metFORMIN (GLUCOPHAGE) 500 MG tablet Take 2 tablets (1,000 mg total) by mouth 2 (two) times daily with a meal. 120 tablet 3  . zolpidem (AMBIEN) 10 MG tablet Take 1 tablet (10 mg total) by mouth at bedtime as needed for sleep. 30 tablet 0   No current facility-administered medications for this visit.    REVIEW OF SYSTEMS:   Constitutional: Denies fevers, chills or abnormal night sweats Eyes: Denies blurriness of vision, double vision or watery eyes Ears, nose, mouth, throat, and face: Denies mucositis or sore throat Respiratory: Denies cough,  dyspnea or wheezes Cardiovascular: Denies palpitation, chest discomfort or lower extremity swelling Gastrointestinal:  Denies nausea, heartburn or change in bowel habits Skin: Denies abnormal skin rashes Lymphatics: Denies new lymphadenopathy or easy bruising Neurological:Denies numbness, tingling or new weaknesses Behavioral/Psych: Mood is stable, no new changes  All other systems were reviewed with  the patient and are negative.  PHYSICAL EXAMINATION: ECOG PERFORMANCE STATUS: 0 - Asymptomatic  Filed Vitals:   04/24/15 1029  BP: 133/82  Pulse: 123  Temp: 97.6 F (36.4 C)  Resp: 20   Filed Weights   04/24/15 1029  Weight: 207 lb (93.895 kg)    GENERAL:alert, no distress and comfortable SKIN: skin color, texture, turgor are normal, no rashes or significant lesions EYES: normal, conjunctiva are pink and non-injected, sclera clear OROPHARYNX:no exudate, no erythema and lips, buccal mucosa, and tongue normal  NECK: supple, thyroid normal size, non-tender, without nodularity LYMPH:  no palpable lymphadenopathy in the cervical, axillary or inguinal LUNGS: clear to auscultation and percussion with normal breathing effort HEART: regular rate & rhythm and no murmurs and no lower extremity edema ABDOMEN:abdomen soft, non-tender and normal bowel sounds Musculoskeletal:no cyanosis of digits and no clubbing  PSYCH: alert & oriented x 3 with fluent speech NEURO: no focal motor/sensory deficits  LABORATORY DATA:  I have reviewed the data as listed CBC Latest Ref Rng 03/19/2015 08/27/2014 11/06/2013  WBC 4.0 - 10.5 K/uL 8.5 7.6 9.7  Hemoglobin 12.0 - 15.0 g/dL 11.8(L) 11.8(L) 12.6  Hematocrit 36.0 - 46.0 % 35.7(L) 35.3(L) 37.4  Platelets 150.0 - 400.0 K/uL 95.0(L) 143.0 Repeated and verified X2.(L) 121.0(L)    CMP Latest Ref Rng 03/19/2015 08/27/2014 05/19/2014  Glucose 70 - 99 mg/dL 285(H) 105(H) 143(H)  BUN 6 - 23 mg/dL _0 Creatinine 0.40 - 1.20 mg/dL 0.80 0.71 0.7  Sodium 135 - 145 mEq/L 137 136 138  Potassium 3.5 - 5.1 mEq/L 4.7 4.3 4.0  Chloride 96 - 112 mEq/L 99 100 102  CO2 19 - 32 mEq/L _1 Calcium 8.4 - 10.5 mg/dL 9.7 9.4 8.9  Total Protein 6.0 - 8.3 g/dL 7.6 7.4 7.3  Total Bilirubin 0.2 - 1.2 mg/dL 0.4 0.6 0.9  Alkaline Phos 39 - 117 U/L 61 56 54  AST 0 - 37 U/L _2 ALT 0 - 35 U/L _3 Results for Kathleen Gibson (MRN [id number]) as of 04/24/2015  10:39  Ref. Range 01/30/2008 12:39 08/20/2012 12:53 11/06/2013 08:15 08/27/2014 09:00 03/19/2015 08:52  WBC Latest Ref Range: 4.0-10.5 K/uL 6.8 8.4 9.7 7.6 8.5  RBC Latest Ref Range: 3.87-5.11 Mil/uL 4.05 4.38 4.37 4.11 4.10  Hemoglobin Latest Ref Range: 12.0-15.0 g/dL 12.9 12.5 12.6 11.8 (L) 11.8 (L)  HCT Latest Ref Range: 36.0-46.0 % 36.9 37.6 37.4 35.3 (L) 35.7 (L)  MCV Latest Ref Range: 78.0-100.0 fl 91.2 85.8 85.7 85.8 87.2  MCHC Latest Ref Range: 30.0-36.0 g/dL 34.8 33.2 33.6 33.5 33.2  RDW Latest Ref Range: 11.5-15.5 % 13.6 15.6 (H) 16.6 (H) 15.8 (H) 15.5  Platelets Latest Ref Range: 150.0-400.0 K/uL 194 127.0 (L) 121.0 (L) 143.0 Repeated an... (L) 95.0 (L)  Neutrophils Latest Ref Range: 43.0-77.0 % 56.4 53.0 52.6 53.4 58.2  Lymphocytes Latest Ref Range: 12.0-46.0 % 31.4 27.6 27.2 26.6 26.1  Monocytes Relative Latest Ref Range: 3.0-12.0 % 11.6 18.9 (H) 19.6 (H) 19.6 Repeated and... (H) 15.2 (H)  Eosinophil Latest Ref Range: 0.0-5.0 % 0.3 0.2 0.3 0.2 0.2  Basophil Latest Ref  Range: 0.0-3.0 % 0.3 0.3 0.3 0.2 0.3  NEUT# Latest Ref Range: 1.4-7.7 K/uL 3.9 4.5 5.1 4.0 4.9  Lymphocyte # Latest Ref Range: 0.7-4.0 K/uL  2.3 2.7 2.0 2.2  Monocyte # Latest Ref Range: 0.1-1.0 K/uL 0.8 1.6 (H) 1.9 (H) 1.5 (H) 1.3 (H)  Eosinophils Absolute Latest Ref Range: 0.0-0.7 K/uL 0.0 0.0 0.0 0.0 0.0  Basophils Absolute Latest Ref Range: 0.0-0.1 K/uL 0.0 0.0 0.0 0.0 0.0    RADIOGRAPHIC STUDIES: I have personally reviewed the radiological images as listed and agreed with the findings in the report. No results found.  ASSESSMENT & PLAN:  64 year old Caucasian female, with past medical history of gastric bypass, diabetes, hypertension, presented with mild thrombocytopenia for 2 years, and mild anemia. History of pernicious anemia after gastric surgery.  1. Mild thrombocytopenia -I discussed the common etiology for chronic mild thrombocytopenia, which include autoimmune related (ITP), liver disease, splenomegaly,  medication or alcohol induced, chronic infection such as hepatitis C and HIV, and bone marrow disease such as MDS. The other etiology such as infection, malignancy, microangiopathy, DIC a less likely given the indolent course and her clinical presentation. -she does not drink alcohol and HIV, Hep C was negative  -she is on monthly B12 injection due to the history of pernicious anemia -I'll repeat her CBC, check ANA, lupus anticoagulant, and folic acid level, and review her peripheral smear -Ultrasound of abdomen to evaluate her liver and spleen. -We discussed the role of bone marrow biopsy. Giving her mild thrombocytopenia and chronic course, I do not feel bone marrow biopsy is needed at this point. Certainly if her thrombocytopenia gets worse, or she develops another cytopenia, I would consider bone marrow biopsy to ruled out MDS and other blood disorders. -If the above workup is negative, she likely has chronic ITP. We discussed that she likely would not require treatment if her platelet count remained adequately. -We discussed the risk of bleeding from thrombocytopenia. Giving the mild degree of thrombocytopenia, the risk of bleeding is not very high. But she knows to avoid injury.   2. Mild anemia -Normocytic, I'll check reticulocyte count -She had history of pernicious anemia, is on B12 injection monthly -I'll check iron study, SPEP with immunofixation  3. DM, HTN -she'll continue follow-up with her primary care physicianN  Follow up: -Repeat a CBC in 3 months, I'll see her back in 6 months with lab CBC and differential   All questions were answered. The patient knows to call the clinic with any problems, questions or concerns. I spent 40 minutes counseling the patient face to face. The total time spent in the appointment was 45 minutes and more than 50% was on counseling.     Truitt Merle, MD 04/24/2015 10:55 AM

## 2015-04-24 NOTE — Telephone Encounter (Signed)
Gave pt appts for 07/23/15 + 10/29/15. Advised pt radiology will call to schedule Dec Abd [place] ordered.

## 2015-04-25 ENCOUNTER — Encounter: Payer: Self-pay | Admitting: Hematology

## 2015-04-25 DIAGNOSIS — D693 Immune thrombocytopenic purpura: Secondary | ICD-10-CM | POA: Insufficient documentation

## 2015-04-28 LAB — RFX PTT-LA W/RFX TO HEX PHASE CONF: PTT-LA SCREEN: 29 s (ref <=40)

## 2015-04-28 LAB — FOLATE RBC: RBC FOLATE: 842 ng/mL (ref >280)

## 2015-04-28 LAB — KAPPA/LAMBDA LIGHT CHAINS
KAPPA FREE LGHT CHN: 2.51 mg/dL — AB (ref 0.33–1.94)
KAPPA LAMBDA RATIO: 1.31 (ref 0.26–1.65)
LAMBDA FREE LGHT CHN: 1.91 mg/dL (ref 0.57–2.63)

## 2015-04-28 LAB — ANA: ANA: NEGATIVE

## 2015-04-28 LAB — RFX DRVVT SCR W/RFLX CONF 1:1 MIX: dRVVT Screen: 33 s (ref <=45)

## 2015-04-28 LAB — IMMUNOFIXATION ELECTROPHORESIS
IGA: 378 mg/dL (ref 69–380)
IGG (IMMUNOGLOBIN G), SERUM: 1510 mg/dL (ref 690–1700)
IgM, Serum: 75 mg/dL (ref 52–322)
Total Protein, Serum Electrophoresis: 8.1 g/dL (ref 6.0–8.3)

## 2015-04-28 LAB — LUPUS ANTICOAGULANT PANEL

## 2015-05-28 ENCOUNTER — Other Ambulatory Visit: Payer: Self-pay

## 2015-05-28 MED ORDER — ZOLPIDEM TARTRATE 10 MG PO TABS: 10.0000 mg | ORAL_TABLET | Freq: Every evening | ORAL | Status: DC | PRN

## 2015-05-28 NOTE — Telephone Encounter (Signed)
Rx called in to requested pharmacy 

## 2015-05-28 NOTE — Telephone Encounter (Signed)
Pt left v/m requesting refill ambien to [facility]. Last refilled # 30 on 04/21/15. Last seen 03/19/15.

## 2015-06-02 ENCOUNTER — Ambulatory Visit (HOSPITAL_COMMUNITY)
Admission: RE | Admit: 2015-06-02 | Discharge: 2015-06-02 | Disposition: A | Discharge status: Home / Self Care | Payer: 59 | Source: Ambulatory Visit | Attending: Hematology | Admitting: Hematology

## 2015-06-02 DIAGNOSIS — D696 Thrombocytopenia, unspecified: Secondary | ICD-10-CM | POA: Diagnosis present

## 2015-06-02 DIAGNOSIS — R161 Splenomegaly, not elsewhere classified: Secondary | ICD-10-CM | POA: Insufficient documentation

## 2015-06-02 DIAGNOSIS — K8020 Calculus of gallbladder without cholecystitis without obstruction: Secondary | ICD-10-CM | POA: Insufficient documentation

## 2015-06-02 DIAGNOSIS — D649 Anemia, unspecified: Secondary | ICD-10-CM

## 2015-06-06 LAB — UIFE/LIGHT CHAINS/TP QN, 24-HR UR
Albumin, U: DETECTED
Alpha 1, Urine: DETECTED — AB
Alpha 2, Urine: DETECTED — AB
Beta, Urine: DETECTED — AB
GAMMA UR: DETECTED — AB
TIME-UPE24: 24 h
Total Protein, Urine: 8 mg/dL (ref 5–24)
VOLUME, URINE-UPE24: 1200 mL

## 2015-06-06 LAB — 24 HR URINE,KAPPA/LAMBDA LIGHT CHAINS
Measured Kappa Chain: 0.71 mg/dL (ref <2.00)
TOTAL KAPPA CHAIN: 8.52 mg/(24.h)
URINE VOLUME: 1200 mL/(24.h)

## 2015-06-11 ENCOUNTER — Telehealth: Payer: Self-pay | Admitting: *Deleted

## 2015-06-11 DIAGNOSIS — D696 Thrombocytopenia, unspecified: Secondary | ICD-10-CM

## 2015-06-11 NOTE — Telephone Encounter (Signed)
Called pt & informed of [place] result per Dr Ernestina Penna instructions.  She has not seen a GI doc before but does see Dr Marjory Lies with [facility] in [place] would prefer to be seen at [facility] if possible.  Informed that we would ask about this & referral would be made & if she hasn't heard from anyone in a couple of wks to call us back.  Pt expressed understanding.  Referral made.

## 2015-06-11 NOTE — Telephone Encounter (Signed)
-----   Message from Truitt Merle, MD sent at 06/10/2015  7:14 PM EST ----- Janifer Adie, please call pt and let her know the [place] result. She has fatty liver and enlarged spleen which could be the reason she has mild low platelet.   I would like her to see GI, if she has never seen GI before, please refer her to [facility] for her liver issue.   I have reviewed her lab from 04/2015 which was unremarkable except mild thrombocytopenia.   Thanks,  Truitt Merle  06/10/2015

## 2015-06-12 ENCOUNTER — Telehealth: Payer: Self-pay | Admitting: Hematology

## 2015-06-12 NOTE — Telephone Encounter (Signed)
pof noted and referral edited to [facility] and they will call her with an appointment

## 2015-06-18 ENCOUNTER — Ambulatory Visit (INDEPENDENT_AMBULATORY_CARE_PROVIDER_SITE_OTHER): Payer: BLUE CROSS/BLUE SHIELD

## 2015-06-18 DIAGNOSIS — E538 Deficiency of other specified B group vitamins: Secondary | ICD-10-CM | POA: Diagnosis not present

## 2015-06-18 MED ORDER — CYANOCOBALAMIN 1000 MCG/ML IJ SOLN
1000.0000 ug | Freq: Once | INTRAMUSCULAR | Status: AC
  Administered 2015-06-18: 1000 ug via INTRAMUSCULAR

## 2015-06-25 ENCOUNTER — Other Ambulatory Visit: Payer: Self-pay

## 2015-06-25 MED ORDER — ZOLPIDEM TARTRATE 10 MG PO TABS: 10.0000 mg | ORAL_TABLET | Freq: Every evening | ORAL | Status: DC | PRN

## 2015-06-25 NOTE — Telephone Encounter (Signed)
Pt left v/m requesting refill zolpidem to [facility] [address]. Last refilled # 30 on 05/28/15. Last seen 03/19/15.

## 2015-06-26 NOTE — Telephone Encounter (Signed)
Rx called in to requested pharmacy 

## 2015-07-03 ENCOUNTER — Telehealth: Payer: Self-pay | Admitting: *Deleted

## 2015-07-03 NOTE — Telephone Encounter (Signed)
Fax received indicating PA required for pts tradjenta. Completed online via covermymeds. Awaiting response, within 3 business days, per message

## 2015-07-03 NOTE — Telephone Encounter (Signed)
Response received, indicating pt must first try, and fail, on onglyza, before approval can be considered. pls advise

## 2015-07-06 ENCOUNTER — Encounter: Payer: Self-pay | Admitting: Family Medicine

## 2015-07-06 NOTE — Telephone Encounter (Signed)
We have received a response regarding her tradjenta. See additional phone note

## 2015-07-06 NOTE — Telephone Encounter (Signed)
See previous phone note.  

## 2015-07-07 MED ORDER — GLYBURIDE 5 MG PO TABS: ORAL_TABLET | ORAL | Status: DC

## 2015-07-07 NOTE — Telephone Encounter (Signed)
Please let pt know. If she agrees, i will send in rx for onglyza.

## 2015-07-07 NOTE — Addendum Note (Signed)
Addended by: Lucille Passy on: 07/07/2015 10:22 AM   Modules accepted: Orders, Medications

## 2015-07-07 NOTE — Telephone Encounter (Signed)
Noted.  eRx sent. 

## 2015-07-07 NOTE — Telephone Encounter (Signed)
She said that it gave her a slight upset stomach, but was effective

## 2015-07-07 NOTE — Telephone Encounter (Signed)
According to my last note, she was not tolerating glyburide well.

## 2015-07-07 NOTE — Telephone Encounter (Signed)
Spoke to pt and advised of denial. Pt is wanting to know if she is able to switch back to glyburide

## 2015-07-21 ENCOUNTER — Telehealth: Payer: Self-pay

## 2015-07-21 ENCOUNTER — Ambulatory Visit (INDEPENDENT_AMBULATORY_CARE_PROVIDER_SITE_OTHER): Payer: BLUE CROSS/BLUE SHIELD

## 2015-07-21 DIAGNOSIS — E538 Deficiency of other specified B group vitamins: Secondary | ICD-10-CM

## 2015-07-21 MED ORDER — CYANOCOBALAMIN 1000 MCG/ML IJ SOLN
1000.0000 ug | Freq: Once | INTRAMUSCULAR | Status: AC
  Administered 2015-07-21: 1000 ug via INTRAMUSCULAR

## 2015-07-21 NOTE — Telephone Encounter (Signed)
Pt request cb; pt saw a hematologist who referred pt to see GI; pt scheduled to see GI end of Feb. Pt said was not given explanation of why needed to see GI specialist. Pt request cb with why pt needs to see GI. Please 06/11/15 phone note. Pt said my chart response or phone call would be OK.

## 2015-07-21 NOTE — Telephone Encounter (Signed)
She may want to ask the hematologist directly but I assume it is to rule out ALL causes of anemia, including colon cancer and other pathology.

## 2015-07-21 NOTE — Telephone Encounter (Signed)
Lm on pts vm requesting a call back 

## 2015-07-21 NOTE — Telephone Encounter (Signed)
Spoke to pt and advised per Dr Deborra Medina. Pt states she will inquire with hematology when she has labs drawn 2/9

## 2015-07-21 NOTE — Telephone Encounter (Signed)
OV in note in Epic. See 12/29 phone note

## 2015-07-21 NOTE — Telephone Encounter (Signed)
Pt returned your call Best number to call [phone]

## 2015-07-21 NOTE — Telephone Encounter (Signed)
Can we please request a copy of OV from hematology?

## 2015-07-23 ENCOUNTER — Other Ambulatory Visit (HOSPITAL_BASED_OUTPATIENT_CLINIC_OR_DEPARTMENT_OTHER): Payer: BLUE CROSS/BLUE SHIELD

## 2015-07-23 ENCOUNTER — Other Ambulatory Visit: Payer: Self-pay

## 2015-07-23 DIAGNOSIS — D649 Anemia, unspecified: Secondary | ICD-10-CM | POA: Diagnosis not present

## 2015-07-23 DIAGNOSIS — D696 Thrombocytopenia, unspecified: Secondary | ICD-10-CM

## 2015-07-23 LAB — CBC & DIFF AND RETIC
BASO%: 0.2 % (ref 0.0–2.0)
Basophils Absolute: 0 10*3/uL (ref 0.0–0.1)
EOS ABS: 0 10*3/uL (ref 0.0–0.5)
EOS%: 0.3 % (ref 0.0–7.0)
HCT: 34.9 % (ref 34.8–46.6)
HEMOGLOBIN: 11.4 g/dL — AB (ref 11.6–15.9)
Immature Retic Fract: 6.1 % (ref 1.60–10.00)
LYMPH#: 3.1 10*3/uL (ref 0.9–3.3)
LYMPH%: 26.9 % (ref 14.0–49.7)
MCH: 28.6 pg (ref 25.1–34.0)
MCHC: 32.7 g/dL (ref 31.5–36.0)
MCV: 87.5 fL (ref 79.5–101.0)
MONO#: 2.5 10*3/uL — AB (ref 0.1–0.9)
MONO%: 22.3 % — AB (ref 0.0–14.0)
NEUT%: 50.3 % (ref 38.4–76.8)
NEUTROS ABS: 5.7 10*3/uL (ref 1.5–6.5)
Platelets: 103 10*3/uL — ABNORMAL LOW (ref 145–400)
RBC: 3.99 10*6/uL (ref 3.70–5.45)
RDW: 14.8 % — AB (ref 11.2–14.5)
RETIC %: 1.68 % (ref 0.70–2.10)
RETIC CT ABS: 67.03 10*3/uL (ref 33.70–90.70)
WBC: 11.3 10*3/uL — AB (ref 3.9–10.3)

## 2015-07-23 LAB — TECHNOLOGIST REVIEW

## 2015-07-23 MED ORDER — ZOLPIDEM TARTRATE 10 MG PO TABS: 10.0000 mg | ORAL_TABLET | Freq: Every evening | ORAL | Status: DC | PRN

## 2015-07-23 NOTE — Telephone Encounter (Signed)
Rx called in to requested pharmacy 

## 2015-07-23 NOTE — Telephone Encounter (Signed)
Pt left v/m requesting refill ambien to [facility] [address] [place]. Last refilled # 30 on 06/25/15. Last f/u appt 03/19/15.

## 2015-08-11 ENCOUNTER — Ambulatory Visit (INDEPENDENT_AMBULATORY_CARE_PROVIDER_SITE_OTHER): Payer: BLUE CROSS/BLUE SHIELD | Admitting: Gastroenterology

## 2015-08-11 ENCOUNTER — Other Ambulatory Visit (INDEPENDENT_AMBULATORY_CARE_PROVIDER_SITE_OTHER): Payer: BLUE CROSS/BLUE SHIELD

## 2015-08-11 ENCOUNTER — Encounter: Payer: Self-pay | Admitting: Gastroenterology

## 2015-08-11 VITALS — BP 128/76 | HR 88 | Ht 61.0 in | Wt 204.8 lb

## 2015-08-11 DIAGNOSIS — D696 Thrombocytopenia, unspecified: Secondary | ICD-10-CM

## 2015-08-11 DIAGNOSIS — Z1211 Encounter for screening for malignant neoplasm of colon: Secondary | ICD-10-CM

## 2015-08-11 DIAGNOSIS — K8020 Calculus of gallbladder without cholecystitis without obstruction: Secondary | ICD-10-CM

## 2015-08-11 DIAGNOSIS — K760 Fatty (change of) liver, not elsewhere classified: Secondary | ICD-10-CM | POA: Diagnosis not present

## 2015-08-11 LAB — BASIC METABOLIC PANEL
BUN: 17 mg/dL (ref 6–23)
CHLORIDE: 100 meq/L (ref 96–112)
CO2: 30 meq/L (ref 19–32)
Calcium: 9.7 mg/dL (ref 8.4–10.5)
Creatinine, Ser: 0.71 mg/dL (ref 0.40–1.20)
GFR: 88.1 mL/min (ref >60.00)
Glucose, Bld: 128 mg/dL — ABNORMAL HIGH (ref 70–99)
POTASSIUM: 4.2 meq/L (ref 3.5–5.1)
Sodium: 138 mEq/L (ref 135–145)

## 2015-08-11 LAB — PROTIME-INR
INR: 1.1 ratio — ABNORMAL HIGH (ref 0.8–1.0)
Prothrombin Time: 11.9 s (ref 9.6–13.1)

## 2015-08-11 NOTE — Progress Notes (Signed)
Gastroenterology and Hepatology Consult Note:  History: Kathleen Gibson 08/11/2015  Referring physician: Arnette Norris, MD  Reason for consult/chief complaint: Elevated Hepatic Enzymes   Subjective HPI:  This is a delightful 65 year old woman referred to me for low platelets and concern for underlying liver disease. It seems she has had platelets about 120 240,000 back in 2014, but they were down to 95,000 on labs several months ago. She was thus sent to see hematology (Dr. Burr Medico), who is November 2016 consult note I reviewed in detail.  An ultrasound revealed abnormalities detailed below, the patient was referred to [place]. She has no personal or family history of liver disease, no significant alcohol use and no known viral hepatitis. She denies abdominal pain, rectal bleeding, or change in bowel habits.  ROS:  Review of Systems  Constitutional: Negative for appetite change and unexpected weight change.  HENT: Negative for mouth sores and voice change.   Eyes: Negative for pain and redness.  Respiratory: Negative for cough and shortness of breath.   Cardiovascular: Negative for chest pain and palpitations.  Genitourinary: Negative for dysuria and hematuria.  Musculoskeletal: Negative for myalgias and arthralgias.  Skin: Negative for pallor and rash.  Neurological: Negative for weakness and headaches.  Hematological: Negative for adenopathy.     Past Medical History: Past Medical History  Diagnosis Date  . Hypertension   . Diabetes ([place]) at least 15 yrs since Dx - admits to dietary noncompliance   . HLD (hyperlipidemia)   . Pernicious anemia    No prior screening colonoscopy  Past Surgical History: Past Surgical History  Procedure Laterality Date  . Gastric bypass  2000  . Hemorroid surgery    . Abdominal hernia repair  2009     Family History: Family History  Problem Relation Age of Onset  . Cancer Mother 63    unknown type cancer   . Hypertension Mother   . Hypertension  Father   . Diabetes Sister   . Leukemia Brother   . Cancer Brother 20    colon cancer     Social History: Social History   Social History  . Marital Status: Divorced    Spouse Name: N/A  . Number of Children: N/A  . Years of Education: N/A   Social History Main Topics  . Smoking status: Never Smoker   . Smokeless tobacco: Never Used  . Alcohol Use: No  . Drug Use: No  . Sexual Activity: Not Asked   Other Topics Concern  . None   Social History Narrative    Allergies: No Known Allergies  Outpatient Meds: Current Outpatient Prescriptions  Medication Sig Dispense Refill  . cyanocobalamin (,VITAMIN B-12,) 1000 MCG/ML injection Inject 1 mL (1,000 mcg total) into the muscle once. 1 mL 0  . glucose monitoring kit (FREESTYLE) monitoring kit 1 each by Does not apply route as needed for other. Use as directed 1 each 0  . glyBURIDE (DIABETA) 5 MG tablet TAKE 1 TABLET BY MOUTH DAILY WITH BREAKFAST. 90 tablet 1  . lisinopril-hydrochlorothiazide (PRINZIDE,ZESTORETIC) 20-25 MG tablet TAKE ONE TABLET BY MOUTH EVERY DAY 90 tablet 2  . metFORMIN (GLUCOPHAGE) 500 MG tablet Take 2 tablets (1,000 mg total) by mouth 2 (two) times daily with a meal. 120 tablet 3  . zolpidem (AMBIEN) 10 MG tablet Take 1 tablet (10 mg total) by mouth at bedtime as needed for sleep. 30 tablet 0   No current facility-administered medications for this visit.      ___________________________________________________________________ Objective  Exam:  BP 128/76 mmHg  Pulse 88  Ht '5\' 1"'$  (1.549 m)  Wt 204 lb 12.8 oz (92.897 kg)  BMI 38.72 kg/m2  General: this is a overweight middle-aged white female, not acutely ill-appearing.  Eyes: sclera anicteric, no redness  ENT: oral mucosa moist without lesions, no cervical or supraclavicular lymphadenopathy, good dentition  CV: RRR without murmur, S1/S2, no JVD, no peripheral edema  Resp: clear to auscultation bilaterally, normal RR and effort noted  GI:  soft, no tenderness, with active bowel sounds. No guarding or palpable organomegaly noted  Skin; warm and dry, no rash or jaundice noted, facial hair  Neuro: awake, alert and oriented x 3. Normal gross motor function and fluent speech  Labs:  LFTs nml 08/2014 and 03/2015 Ferritin 25 04/2015 B12 normal (on supplements)  Lab Results  Component Value Date   WBC 11.3* 07/23/2015   HGB 11.4* 07/23/2015   HCT 34.9 07/23/2015   MCV 87.5 07/23/2015   PLT 103* 07/23/2015    Radiologic Studies:  U/S 06/11/15 gallstones cbd 31m Fatty liver and splenomegaly  Assessment: Encounter Diagnoses  Name Primary?  . Fatty liver disease, nonalcoholic Yes  . Colon cancer screening  - family history of colon cancer in her brother    . Thrombocytopenia ([place]   . Gallstones     I think it is likely that she has underlying hepatic cirrhosis from NASH The gallstones are asymptomatic.  Plan:  CT A/P to look for possible cirrhosis If cirrhosis, get viral hep panel and INR, then plan EGD (variceal screen) and screening colonoscopy in high risk patient (fam hx)  Thank you for the courtesy of this consult.  Please call me with any questions or concerns.  HNelida MeuseIII

## 2015-08-11 NOTE — Patient Instructions (Addendum)
You have been scheduled for a CT scan of the abdomen and pelvis at [facility] ([address]---this is in the same building as [facility]).   You are scheduled on 08-14-15 at 10:30. You should arrive 15 minutes prior to your appointment time for registration. Please follow the written instructions below on the day of your exam:  WARNING: IF YOU ARE ALLERGIC TO IODINE/X-RAY DYE, PLEASE NOTIFY RADIOLOGY IMMEDIATELY AT [phone]! YOU WILL BE GIVEN A 13 HOUR PREMEDICATION PREP.  1) Do not eat or drink anything after 6:30am (4 hours prior to your test) 2) You have been given 2 bottles of oral contrast to drink. The solution may taste better if refrigerated, but do NOT add ice or any other liquid to this solution. Shake well before drinking.    Drink 1 bottle of contrast @ 830am (2 hours prior to your exam)  Drink 1 bottle of contrast @ 930am (1 hour prior to your exam)  You may take any medications as prescribed with a small amount of water except for the following: Metformin, Glucophage, Glucovance, Avandamet, Riomet, Fortamet, Actoplus Met, Janumet, Glumetza or Metaglip. The above medications must be held the day of the exam AND 48 hours after the exam.  The purpose of you drinking the oral contrast is to aid in the visualization of your intestinal tract. The contrast solution may cause some diarrhea. Before your exam is started, you will be given a small amount of fluid to drink. Depending on your individual set of symptoms, you may also receive an intravenous injection of x-ray contrast/dye. Plan on being at [facility] for 30 minutes or longer, depending on the type of exam you are having performed.  This test typically takes 30-45 minutes to complete.  If you have any questions regarding your exam or if you need to reschedule, you may call the CT department at [phone] between the hours of 8:00 am and 5:00 pm,  Monday-Friday.  ________________________________________________________________________  Your physician has requested that you go to the basement for lab work before leaving today.  Thank you for choosing [facility]   Dr Kennethia Lynes Danis III   

## 2015-08-14 ENCOUNTER — Other Ambulatory Visit: Payer: BLUE CROSS/BLUE SHIELD

## 2015-08-19 ENCOUNTER — Other Ambulatory Visit: Payer: Self-pay

## 2015-08-19 ENCOUNTER — Ambulatory Visit (INDEPENDENT_AMBULATORY_CARE_PROVIDER_SITE_OTHER): Payer: BLUE CROSS/BLUE SHIELD

## 2015-08-19 DIAGNOSIS — E538 Deficiency of other specified B group vitamins: Secondary | ICD-10-CM

## 2015-08-19 MED ORDER — ZOLPIDEM TARTRATE 10 MG PO TABS: 10.0000 mg | ORAL_TABLET | Freq: Every evening | ORAL | Status: DC | PRN

## 2015-08-19 MED ORDER — CYANOCOBALAMIN 1000 MCG/ML IJ SOLN
1000.0000 ug | Freq: Once | INTRAMUSCULAR | Status: AC
  Administered 2015-08-19: 1000 ug via INTRAMUSCULAR

## 2015-08-19 NOTE — Telephone Encounter (Signed)
Pt request refill ambien to [facility]. Last refilled # 30 on 07/23/15. Pt last seen 03/19/15.

## 2015-08-19 NOTE — Telephone Encounter (Signed)
Rx called in to requested pharmacy 

## 2015-08-20 ENCOUNTER — Other Ambulatory Visit: Payer: Self-pay | Admitting: Family Medicine

## 2015-08-20 NOTE — Telephone Encounter (Signed)
Last labs abnormal. pls advise 

## 2015-08-20 NOTE — Telephone Encounter (Signed)
Spoke to pt and informed her f/u needed. Lab appt scheduled.

## 2015-08-20 NOTE — Telephone Encounter (Signed)
Labs reviewed and you are correct.  She does need to come in for an a1c.  Ok to refill and to schedule lab visit please.

## 2015-08-21 ENCOUNTER — Ambulatory Visit (INDEPENDENT_AMBULATORY_CARE_PROVIDER_SITE_OTHER)
Admission: RE | Admit: 2015-08-21 | Discharge: 2015-08-21 | Disposition: A | Discharge status: Home / Self Care | Payer: BLUE CROSS/BLUE SHIELD | Source: Ambulatory Visit | Attending: Gastroenterology | Admitting: Gastroenterology

## 2015-08-21 DIAGNOSIS — K8020 Calculus of gallbladder without cholecystitis without obstruction: Secondary | ICD-10-CM | POA: Diagnosis not present

## 2015-08-21 DIAGNOSIS — K760 Fatty (change of) liver, not elsewhere classified: Secondary | ICD-10-CM | POA: Diagnosis not present

## 2015-08-21 DIAGNOSIS — D696 Thrombocytopenia, unspecified: Secondary | ICD-10-CM | POA: Diagnosis not present

## 2015-08-21 DIAGNOSIS — Z1211 Encounter for screening for malignant neoplasm of colon: Secondary | ICD-10-CM | POA: Diagnosis not present

## 2015-08-21 MED ORDER — IOHEXOL 300 MG/ML  SOLN
100.0000 mL | Freq: Once | INTRAMUSCULAR | Status: AC | PRN
  Administered 2015-08-21: 100 mL via INTRAVENOUS

## 2015-08-25 ENCOUNTER — Other Ambulatory Visit: Payer: BLUE CROSS/BLUE SHIELD

## 2015-08-28 ENCOUNTER — Other Ambulatory Visit (INDEPENDENT_AMBULATORY_CARE_PROVIDER_SITE_OTHER): Payer: BLUE CROSS/BLUE SHIELD

## 2015-08-28 DIAGNOSIS — E1165 Type 2 diabetes mellitus with hyperglycemia: Secondary | ICD-10-CM

## 2015-08-28 DIAGNOSIS — E118 Type 2 diabetes mellitus with unspecified complications: Secondary | ICD-10-CM

## 2015-08-28 DIAGNOSIS — IMO0002 Reserved for concepts with insufficient information to code with codable children: Secondary | ICD-10-CM

## 2015-08-28 LAB — HEMOGLOBIN A1C: HEMOGLOBIN A1C: 8.5 % — AB (ref 4.6–6.5)

## 2015-09-16 ENCOUNTER — Other Ambulatory Visit: Payer: Self-pay | Admitting: Family Medicine

## 2015-09-16 NOTE — Telephone Encounter (Signed)
Last A1C abnormal 03/2015. pls advise

## 2015-09-21 ENCOUNTER — Other Ambulatory Visit: Payer: Self-pay

## 2015-09-21 MED ORDER — ZOLPIDEM TARTRATE 10 MG PO TABS: 10.0000 mg | ORAL_TABLET | Freq: Every evening | ORAL | Status: DC | PRN

## 2015-09-21 NOTE — Telephone Encounter (Signed)
Pt left v/m [facility] [place] Last refilled # 30 on 08/19/15. Last seen 03/19/2015.

## 2015-09-21 NOTE — Telephone Encounter (Signed)
Rx called in to requested pharmacy 

## 2015-10-20 ENCOUNTER — Ambulatory Visit (INDEPENDENT_AMBULATORY_CARE_PROVIDER_SITE_OTHER): Payer: BLUE CROSS/BLUE SHIELD

## 2015-10-20 ENCOUNTER — Telehealth: Payer: Self-pay | Admitting: Hematology

## 2015-10-20 DIAGNOSIS — E538 Deficiency of other specified B group vitamins: Secondary | ICD-10-CM | POA: Diagnosis not present

## 2015-10-20 MED ORDER — CYANOCOBALAMIN 1000 MCG/ML IJ SOLN
1000.0000 ug | Freq: Once | INTRAMUSCULAR | Status: AC
  Administered 2015-10-20: 1000 ug via INTRAMUSCULAR

## 2015-10-20 NOTE — Telephone Encounter (Signed)
pt needed to cx 5/18 w/ dr Burr Medico, going to be out of town

## 2015-10-21 ENCOUNTER — Other Ambulatory Visit: Payer: Self-pay

## 2015-10-21 MED ORDER — ZOLPIDEM TARTRATE 10 MG PO TABS: 10.0000 mg | ORAL_TABLET | Freq: Every evening | ORAL | Status: DC | PRN

## 2015-10-21 NOTE — Telephone Encounter (Signed)
Pt left /vm [facility] [place]; last refilled # 30 on 09/21/15; pt last seen 03/19/15. Pt request cb when refilled.

## 2015-10-21 NOTE — Telephone Encounter (Signed)
rx called in to requested pharmacy 

## 2015-10-29 ENCOUNTER — Other Ambulatory Visit: Payer: 59

## 2015-10-29 ENCOUNTER — Ambulatory Visit: Payer: 59 | Admitting: Hematology

## 2015-11-17 ENCOUNTER — Other Ambulatory Visit: Payer: Self-pay | Admitting: *Deleted

## 2015-11-17 MED ORDER — ZOLPIDEM TARTRATE 10 MG PO TABS: 10.0000 mg | ORAL_TABLET | Freq: Every evening | ORAL | Status: DC | PRN

## 2015-11-17 NOTE — Telephone Encounter (Signed)
Rx called in to requested pharmacy 

## 2015-11-17 NOTE — Telephone Encounter (Signed)
Patient called requesting a refill on Ambien Last refill 10/21/15 #30 Last office visit 03/19/15

## 2015-11-20 ENCOUNTER — Ambulatory Visit (INDEPENDENT_AMBULATORY_CARE_PROVIDER_SITE_OTHER): Payer: BLUE CROSS/BLUE SHIELD

## 2015-11-20 DIAGNOSIS — E538 Deficiency of other specified B group vitamins: Secondary | ICD-10-CM

## 2015-11-20 MED ORDER — CYANOCOBALAMIN 1000 MCG/ML IJ SOLN
1000.0000 ug | Freq: Once | INTRAMUSCULAR | Status: AC
  Administered 2015-11-20: 1000 ug via INTRAMUSCULAR

## 2015-11-27 ENCOUNTER — Other Ambulatory Visit: Payer: Self-pay | Admitting: Family Medicine

## 2015-11-30 NOTE — Telephone Encounter (Signed)
Lm on pts vm and advised OV with labs required

## 2015-11-30 NOTE — Telephone Encounter (Signed)
Last A1C abnormal. pls advise

## 2015-11-30 NOTE — Telephone Encounter (Signed)
Ok to refill one time only.  Needs a1c for further refills.

## 2015-12-16 ENCOUNTER — Other Ambulatory Visit: Payer: Self-pay

## 2015-12-16 MED ORDER — ZOLPIDEM TARTRATE 10 MG PO TABS: 10.0000 mg | ORAL_TABLET | Freq: Every evening | ORAL | Status: DC | PRN

## 2015-12-16 NOTE — Telephone Encounter (Signed)
Pt left /vm requesting refill zolpidem (last refilled # 30 on 11/17/15) to [facility] [place]; last seen 03/19/15. Please advise.

## 2015-12-16 NOTE — Telephone Encounter (Signed)
Rx called in to requested pharmacy 

## 2015-12-29 ENCOUNTER — Other Ambulatory Visit: Payer: Self-pay | Admitting: Family Medicine

## 2015-12-29 NOTE — Telephone Encounter (Signed)
Last labs 08/2015 abnormal. pls advise

## 2016-01-15 ENCOUNTER — Other Ambulatory Visit: Payer: Self-pay

## 2016-01-15 MED ORDER — ZOLPIDEM TARTRATE 10 MG PO TABS: 10.0000 mg | ORAL_TABLET | Freq: Every evening | ORAL | 0 refills | Status: DC | PRN

## 2016-01-15 NOTE — Telephone Encounter (Signed)
rx called in to requested pharmacy 

## 2016-01-15 NOTE — Telephone Encounter (Signed)
Pt left v/m [facility] [place]. Last refilled #30 on 12/16/15 and last seen f/u 03/19/15.

## 2016-01-15 NOTE — Telephone Encounter (Signed)
Ok to refill rx as requested. 

## 2016-01-18 ENCOUNTER — Other Ambulatory Visit: Payer: Self-pay | Admitting: Family Medicine

## 2016-01-18 NOTE — Telephone Encounter (Signed)
Last a1c abnormal. pls advise

## 2016-01-18 NOTE — Telephone Encounter (Signed)
Ok to refill one time only.  Needs a1c for further refills.

## 2016-01-19 ENCOUNTER — Other Ambulatory Visit: Payer: Self-pay | Admitting: Family Medicine

## 2016-01-19 DIAGNOSIS — E1165 Type 2 diabetes mellitus with hyperglycemia: Principal | ICD-10-CM

## 2016-01-19 DIAGNOSIS — E1142 Type 2 diabetes mellitus with diabetic polyneuropathy: Secondary | ICD-10-CM

## 2016-01-19 DIAGNOSIS — IMO0002 Reserved for concepts with insufficient information to code with codable children: Secondary | ICD-10-CM

## 2016-01-20 ENCOUNTER — Other Ambulatory Visit (INDEPENDENT_AMBULATORY_CARE_PROVIDER_SITE_OTHER): Payer: BLUE CROSS/BLUE SHIELD

## 2016-01-20 ENCOUNTER — Ambulatory Visit (INDEPENDENT_AMBULATORY_CARE_PROVIDER_SITE_OTHER): Payer: BLUE CROSS/BLUE SHIELD | Admitting: *Deleted

## 2016-01-20 ENCOUNTER — Ambulatory Visit: Payer: BLUE CROSS/BLUE SHIELD

## 2016-01-20 DIAGNOSIS — E538 Deficiency of other specified B group vitamins: Secondary | ICD-10-CM | POA: Diagnosis not present

## 2016-01-20 DIAGNOSIS — IMO0002 Reserved for concepts with insufficient information to code with codable children: Secondary | ICD-10-CM

## 2016-01-20 DIAGNOSIS — E1142 Type 2 diabetes mellitus with diabetic polyneuropathy: Secondary | ICD-10-CM

## 2016-01-20 DIAGNOSIS — E1165 Type 2 diabetes mellitus with hyperglycemia: Secondary | ICD-10-CM

## 2016-01-20 LAB — HEMOGLOBIN A1C: HEMOGLOBIN A1C: 8.8 % — AB (ref 4.6–6.5)

## 2016-01-20 MED ORDER — CYANOCOBALAMIN 1000 MCG/ML IJ SOLN
1000.0000 ug | Freq: Once | INTRAMUSCULAR | Status: AC
  Administered 2016-01-20: 1000 ug via INTRAMUSCULAR

## 2016-02-11 ENCOUNTER — Other Ambulatory Visit: Payer: Self-pay

## 2016-02-11 ENCOUNTER — Other Ambulatory Visit: Payer: Self-pay | Admitting: Family Medicine

## 2016-02-11 MED ORDER — ZOLPIDEM TARTRATE 10 MG PO TABS: 10.0000 mg | ORAL_TABLET | Freq: Every evening | ORAL | 0 refills | Status: DC | PRN

## 2016-02-11 NOTE — Telephone Encounter (Signed)
Pt left v/m requesting [facility] [place]. Last seen 03/19/15 and last refill # 30 on 01/15/16.

## 2016-02-11 NOTE — Telephone Encounter (Signed)
Rx called in to requested pharmacy 

## 2016-02-23 ENCOUNTER — Ambulatory Visit (INDEPENDENT_AMBULATORY_CARE_PROVIDER_SITE_OTHER): Payer: BLUE CROSS/BLUE SHIELD

## 2016-02-23 DIAGNOSIS — E538 Deficiency of other specified B group vitamins: Secondary | ICD-10-CM

## 2016-02-23 MED ORDER — CYANOCOBALAMIN 1000 MCG/ML IJ SOLN
1000.0000 ug | Freq: Once | INTRAMUSCULAR | Status: AC
  Administered 2016-02-23: 1000 ug via INTRAMUSCULAR

## 2016-03-08 ENCOUNTER — Other Ambulatory Visit: Payer: Self-pay

## 2016-03-08 MED ORDER — ZOLPIDEM TARTRATE 10 MG PO TABS: 10.0000 mg | ORAL_TABLET | Freq: Every evening | ORAL | 0 refills | Status: DC | PRN

## 2016-03-08 NOTE — Telephone Encounter (Signed)
Rx called in to requested pharmacy 

## 2016-03-08 NOTE — Telephone Encounter (Signed)
Pt left v/m [facility] [place] [place]; last refilled # 30 on 02/11/16; last seen 03/19/15. No future appt scheduled.

## 2016-03-29 ENCOUNTER — Ambulatory Visit (INDEPENDENT_AMBULATORY_CARE_PROVIDER_SITE_OTHER): Payer: BLUE CROSS/BLUE SHIELD

## 2016-03-29 DIAGNOSIS — E538 Deficiency of other specified B group vitamins: Secondary | ICD-10-CM

## 2016-03-29 MED ORDER — CYANOCOBALAMIN 1000 MCG/ML IJ SOLN
1000.0000 ug | Freq: Once | INTRAMUSCULAR | Status: AC
  Administered 2016-03-29: 1000 ug via INTRAMUSCULAR

## 2016-04-04 ENCOUNTER — Other Ambulatory Visit: Payer: Self-pay | Admitting: Family Medicine

## 2016-04-04 NOTE — Telephone Encounter (Signed)
Last f/u 03/2015

## 2016-04-05 NOTE — Telephone Encounter (Signed)
rx called in to requested pharmacy 

## 2016-05-03 ENCOUNTER — Other Ambulatory Visit: Payer: Self-pay

## 2016-05-03 MED ORDER — ZOLPIDEM TARTRATE 10 MG PO TABS: 10.0000 mg | ORAL_TABLET | Freq: Every day | ORAL | 0 refills | Status: DC

## 2016-05-03 NOTE — Telephone Encounter (Signed)
Pt left v/m requesting refill [facility] [place]. Last printed # 30 on 04/04/16. Last seen 03/19/15. No future appt scheduled.

## 2016-05-03 NOTE — Telephone Encounter (Signed)
Rx called in as directed.   

## 2016-05-04 ENCOUNTER — Other Ambulatory Visit: Payer: Self-pay | Admitting: Family Medicine

## 2016-05-10 ENCOUNTER — Other Ambulatory Visit: Payer: Self-pay | Admitting: Family Medicine

## 2016-05-11 NOTE — Telephone Encounter (Signed)
Last labs abnormal. pls advise 

## 2016-05-12 ENCOUNTER — Other Ambulatory Visit: Payer: Self-pay | Admitting: Family Medicine

## 2016-05-12 NOTE — Telephone Encounter (Signed)
Pt left v/m;pt presently in [place] until next week and request refill lisinopril HCTZ to [facility]. Pt does not have BP med in [place]. Pt request refill sent and no cb needed. Refilled # 30 with note needs to schedule appt. Last seen 03/19/15.

## 2016-05-25 ENCOUNTER — Ambulatory Visit (INDEPENDENT_AMBULATORY_CARE_PROVIDER_SITE_OTHER): Payer: BLUE CROSS/BLUE SHIELD | Admitting: Family Medicine

## 2016-05-25 ENCOUNTER — Encounter: Payer: Self-pay | Admitting: Family Medicine

## 2016-05-25 VITALS — BP 122/68 | HR 99 | Temp 97.7°F | Wt 209.0 lb

## 2016-05-25 DIAGNOSIS — IMO0002 Reserved for concepts with insufficient information to code with codable children: Secondary | ICD-10-CM

## 2016-05-25 DIAGNOSIS — E785 Hyperlipidemia, unspecified: Secondary | ICD-10-CM | POA: Diagnosis not present

## 2016-05-25 DIAGNOSIS — G609 Hereditary and idiopathic neuropathy, unspecified: Secondary | ICD-10-CM | POA: Insufficient documentation

## 2016-05-25 DIAGNOSIS — I10 Essential (primary) hypertension: Secondary | ICD-10-CM | POA: Diagnosis not present

## 2016-05-25 DIAGNOSIS — D510 Vitamin B12 deficiency anemia due to intrinsic factor deficiency: Secondary | ICD-10-CM | POA: Diagnosis not present

## 2016-05-25 DIAGNOSIS — E1165 Type 2 diabetes mellitus with hyperglycemia: Secondary | ICD-10-CM | POA: Diagnosis not present

## 2016-05-25 DIAGNOSIS — E1142 Type 2 diabetes mellitus with diabetic polyneuropathy: Secondary | ICD-10-CM

## 2016-05-25 DIAGNOSIS — E538 Deficiency of other specified B group vitamins: Secondary | ICD-10-CM | POA: Diagnosis not present

## 2016-05-25 LAB — LIPID PANEL
CHOL/HDL RATIO: 5
CHOLESTEROL: 164 mg/dL (ref 0–200)
HDL: 30.6 mg/dL — ABNORMAL LOW (ref >39.00)
NONHDL: 133.16
TRIGLYCERIDES: 291 mg/dL — AB (ref 0.0–149.0)
VLDL: 58.2 mg/dL — AB (ref 0.0–40.0)

## 2016-05-25 LAB — HEMOGLOBIN A1C: HEMOGLOBIN A1C: 8.8 % — AB (ref 4.6–6.5)

## 2016-05-25 LAB — COMPREHENSIVE METABOLIC PANEL
ALBUMIN: 4.1 g/dL (ref 3.5–5.2)
ALT: 10 U/L (ref 0–35)
AST: 9 U/L (ref 0–37)
Alkaline Phosphatase: 61 U/L (ref 39–117)
BUN: 18 mg/dL (ref 6–23)
CHLORIDE: 101 meq/L (ref 96–112)
CO2: 29 meq/L (ref 19–32)
Calcium: 9.2 mg/dL (ref 8.4–10.5)
Creatinine, Ser: 0.78 mg/dL (ref 0.40–1.20)
GFR: 78.84 mL/min (ref >60.00)
Glucose, Bld: 142 mg/dL — ABNORMAL HIGH (ref 70–99)
POTASSIUM: 4.3 meq/L (ref 3.5–5.1)
SODIUM: 137 meq/L (ref 135–145)
Total Bilirubin: 0.3 mg/dL (ref 0.2–1.2)
Total Protein: 7.3 g/dL (ref 6.0–8.3)

## 2016-05-25 LAB — MAGNESIUM: MAGNESIUM: 1.8 mg/dL (ref 1.5–2.5)

## 2016-05-25 LAB — LDL CHOLESTEROL, DIRECT: Direct LDL: 93 mg/dL

## 2016-05-25 LAB — VITAMIN B12: Vitamin B-12: 857 pg/mL (ref 211–911)

## 2016-05-25 NOTE — Assessment & Plan Note (Signed)
Discussed increasing glyburide but she does report having low blood sugars although not recently. Will check a1c and other labs first today.

## 2016-05-25 NOTE — Patient Instructions (Addendum)
Great to see you.  Happy Holidays!  We will call you with your results and you can view them online.  Please schedule a physical at your convenience.

## 2016-05-25 NOTE — Assessment & Plan Note (Signed)
At goal on current rx. No changes made today.

## 2016-05-25 NOTE — Progress Notes (Signed)
Pre visit review using our clinic review tool, if applicable. No additional management support is needed unless otherwise documented below in the visit note. 

## 2016-05-25 NOTE — Progress Notes (Signed)
   Subjective:   Patient ID: Kathleen Gibson, female    DOB: [birth year], 65 y.o.   MRN: [id number]  Kathleen Gibson is a pleasant 65 y.o. year old female who presents to clinic today with Follow-up; Medication Refill; and Numbness (fingertips and feet)  on 05/25/2016  HPI: DM- has been poorly controlled for years has and has refused endo referral. She is having more tingling in her feet and fingers. Lab Results  Component Value Date   HGBA1C 8.8 (H) 01/20/2016  Currently taking Metformin 1000 mg twice daily and glyburide 5 mg daily. Not checking her FSBS.  She is on an ACEI- prinzide.  LDL has been at goal for a diabetic but overdue for labs.  Lab Results  Component Value Date   CREATININE 0.71 08/11/2015   Lab Results  Component Value Date   CHOL 168 03/19/2015   HDL 37.40 (L) 03/19/2015   LDLCALC 110 (H) 08/27/2014   LDLDIRECT 95.0 03/19/2015   TRIG 268.0 (H) 03/19/2015   CHOLHDL 4 03/19/2015      Review of Systems  Constitutional: Negative.   HENT: Negative.   Eyes: Negative.   Respiratory: Negative.   Cardiovascular: Negative.   Gastrointestinal: Negative.   Endocrine: Negative.   Genitourinary: Negative.   Musculoskeletal: Negative.   Skin: Negative.   Allergic/Immunologic: Negative.   Neurological: Positive for numbness.  Hematological: Negative.   Psychiatric/Behavioral: Negative.   All other systems reviewed and are negative.      Objective:    BP 122/68   Pulse 99   Temp 97.7 F (36.5 C) (Oral)   Wt 209 lb (94.8 kg)   SpO2 97%   BMI 39.49 kg/m    Physical Exam  Constitutional: She is oriented to person, place, and time. She appears well-developed and well-nourished. No distress.  HENT:  Head: Normocephalic.  Eyes: Conjunctivae are normal.  Neck: Normal range of motion.  Cardiovascular: Normal rate and regular rhythm.   Pulmonary/Chest: Effort normal and breath sounds normal.  Musculoskeletal: Normal range of motion.  Neurological: She is alert  and oriented to person, place, and time. No cranial nerve deficit.  Skin: Skin is warm and dry. She is not diaphoretic.  Psychiatric: She has a normal mood and affect. Her behavior is normal. Judgment and thought content normal.  Nursing note and vitals reviewed.         Assessment & Plan:   Uncontrolled type 2 diabetes mellitus with diabetic polyneuropathy, without long-term current use of insulin (HCC)  Hyperlipidemia, unspecified hyperlipidemia type  Essential hypertension  ANEMIA, PERNICIOUS No Follow-up on file.

## 2016-05-31 ENCOUNTER — Encounter: Payer: Self-pay | Admitting: Family Medicine

## 2016-05-31 NOTE — Telephone Encounter (Signed)
Last B12 normal. pls advise

## 2016-06-03 ENCOUNTER — Telehealth: Payer: Self-pay

## 2016-06-03 NOTE — Telephone Encounter (Signed)
Pt has broken a tooth and needs med for pain; pt has dental appt after first of year.Dr Deborra Medina is out of office and [facility] CMA advised pt should go to UC. Pt voiced understanding.

## 2016-06-07 ENCOUNTER — Telehealth: Payer: Self-pay

## 2016-06-07 MED ORDER — ZOLPIDEM TARTRATE 10 MG PO TABS: 10.0000 mg | ORAL_TABLET | Freq: Every day | ORAL | 0 refills | Status: DC

## 2016-06-07 NOTE — Telephone Encounter (Signed)
Ok to phone in Ambien 

## 2016-06-07 NOTE — Telephone Encounter (Signed)
Pt left v/m requesting [facility] [place]. Last refilled # 30 on 05/03/16. Last seen 05/25/16.

## 2016-06-08 ENCOUNTER — Other Ambulatory Visit: Payer: Self-pay | Admitting: Family Medicine

## 2016-06-09 ENCOUNTER — Other Ambulatory Visit: Payer: Self-pay | Admitting: Family Medicine

## 2016-06-10 ENCOUNTER — Encounter: Payer: Self-pay | Admitting: Family Medicine

## 2016-06-10 ENCOUNTER — Other Ambulatory Visit: Payer: Self-pay | Admitting: Family Medicine

## 2016-06-10 MED ORDER — GLYBURIDE 5 MG PO TABS: 5.0000 mg | ORAL_TABLET | Freq: Two times a day (BID) | ORAL | 3 refills | Status: DC

## 2016-06-10 NOTE — Telephone Encounter (Signed)
Spoke with patient who is very upset that her Lorrin Mais has not been called in to pharmacy yet.  She also expressed displeasure at how her phone call on 06/03/16 was handled.  She feels that we are not very caring .  I apologized to patient and assured her we would call her back when her prescription had been called in.  Patient also wants to schedule her B-12 shot that is 2 months behind.  Offered to schedule today, pt declined.  Best number to call pt is [phone], pt would like call back 06/10/16 am.  Also will schedule B-12 at that time

## 2016-06-10 NOTE — Telephone Encounter (Signed)
Rx called in to requested pharmacy. Junie Panning states she will contact pt directly

## 2016-06-10 NOTE — Telephone Encounter (Signed)
I called Kathleen Gibson and advised that Dr. Deborra Medina sent her a my chart message on 05/31/16 that indicated that her B12 was normal so a B12 shot was not necessary.  I explained that the message had not been read so that may be why she was unaware of this information.  She indicated that she did not receive the message but would go back and check again.  I also advised her that the Ambien refill was called into the pharmacy this morning and that [facility] had been out of the office for the past couple days and that is why the refill was not called in sooner.  I apologized for the inconvenience  And assured her we would work to ensure patient requests are addressed more timely despite short staffing.  Finally we addressed her concern about her broken tooth.  She had wanted an antibiotic and pain med called in for her tooth.  I explained that we do not have standing orders for those medications and that a visit would be necessary before they could be ordered.  Unfortunately, we did not have any available openings when she called and UC was the best option to get treated quickly.  She stated that she received better service at [facility] than she did in our office and was disgruntled throughout the conversation.  She remains unhappy with our office.

## 2016-06-10 NOTE — Telephone Encounter (Signed)
Pt called to ck on status of ambien, still is not at pharmacy. Please call when ready.

## 2016-06-10 NOTE — Telephone Encounter (Signed)
I am very sorry to hear she in unhappy and frustrated. Please let me know how else I can help.

## 2016-07-07 ENCOUNTER — Other Ambulatory Visit: Payer: Self-pay

## 2016-07-07 MED ORDER — ZOLPIDEM TARTRATE 10 MG PO TABS: 10.0000 mg | ORAL_TABLET | Freq: Every day | ORAL | 0 refills | Status: DC

## 2016-07-07 NOTE — Telephone Encounter (Signed)
Pt left v/m requesting ref[facility] [place]. Last refilled # 30 on 06/10/16; pt last seen 05/25/16.

## 2016-07-07 NOTE — Telephone Encounter (Signed)
Rx called in to requested pharmacy 

## 2016-08-01 ENCOUNTER — Other Ambulatory Visit: Payer: Self-pay | Admitting: Family Medicine

## 2016-08-01 NOTE — Telephone Encounter (Signed)
Last labs abnormal. pls advise 

## 2016-08-04 ENCOUNTER — Other Ambulatory Visit: Payer: Self-pay

## 2016-08-04 MED ORDER — ZOLPIDEM TARTRATE 10 MG PO TABS: 10.0000 mg | ORAL_TABLET | Freq: Every day | ORAL | 0 refills | Status: DC

## 2016-08-04 NOTE — Telephone Encounter (Signed)
Left refill on voice mail at pharmacy  

## 2016-08-04 NOTE — Telephone Encounter (Signed)
Pt left v/m [facility] [place];last printed # 30 on 07/07/16. Last seen 05/25/16.

## 2016-08-10 ENCOUNTER — Ambulatory Visit (INDEPENDENT_AMBULATORY_CARE_PROVIDER_SITE_OTHER): Payer: BLUE CROSS/BLUE SHIELD | Admitting: Family Medicine

## 2016-08-10 ENCOUNTER — Ambulatory Visit (INDEPENDENT_AMBULATORY_CARE_PROVIDER_SITE_OTHER): Payer: BLUE CROSS/BLUE SHIELD

## 2016-08-10 ENCOUNTER — Encounter: Payer: Self-pay | Admitting: Family Medicine

## 2016-08-10 VITALS — BP 130/70 | HR 130 | Temp 98.1°F | Ht 61.0 in | Wt 205.0 lb

## 2016-08-10 DIAGNOSIS — E538 Deficiency of other specified B group vitamins: Secondary | ICD-10-CM | POA: Diagnosis not present

## 2016-08-10 LAB — POCT GLUCOSE (DEVICE FOR HOME USE): POC GLUCOSE: 119 mg/dL — AB (ref 70–99)

## 2016-08-10 MED ORDER — GLYBURIDE 5 MG PO TABS: 5.0000 mg | ORAL_TABLET | Freq: Two times a day (BID) | ORAL | 3 refills | Status: DC

## 2016-08-10 MED ORDER — CYANOCOBALAMIN 1000 MCG/ML IJ SOLN
1000.0000 ug | Freq: Once | INTRAMUSCULAR | Status: AC
  Administered 2016-08-10: 1000 ug via INTRAMUSCULAR

## 2016-08-10 NOTE — Patient Instructions (Signed)
B12 INJECTIONS MONTHLY.

## 2016-08-10 NOTE — Progress Notes (Unsigned)
   Subjective:   Patient ID: Kathleen Gibson, female    DOB: Apr 17, 1951, 66 y.o.   MRN: [id number]  Kathleen Gibson is a pleasant 66 y.o. year old female who presents to clinic today with Follow-up (b12 shot? )  on 08/10/2016  HPI:  Lab Results  Component Value Date   HGBA1C 8.8 (H) 05/25/2016       Last  Lab Results  Component Value Date   [id number] 734 05/25/2016     Current Outpatient Prescriptions on File Prior to Visit  Medication Sig Dispense Refill  . cyanocobalamin (,VITAMIN B-12,) 1000 MCG/ML injection Inject 1 mL (1,000 mcg total) into the muscle once. 1 mL 0  . glucose monitoring kit (FREESTYLE) monitoring kit 1 each by Does not apply route as needed for other. Use as directed 1 each 0  . glyBURIDE (DIABETA) 5 MG tablet Take 1 tablet (5 mg total) by mouth 2 (two) times daily with a meal. (Patient taking differently: Take 5 mg by mouth daily with breakfast. ) 60 tablet 3  . glyBURIDE (DIABETA) 5 MG tablet TAKE 1 TABLET DAILY WITH BREAKFAST 90 tablet 1  . lisinopril-hydrochlorothiazide (PRINZIDE,ZESTORETIC) 20-25 MG tablet Take 1 tablet by mouth daily. 90 tablet 2  . metFORMIN (GLUCOPHAGE) 500 MG tablet TAKE 2 TABLETS BY MOUTH TWICE DAILY WITH MEALS. 120 tablet 2  . zolpidem (AMBIEN) 10 MG tablet Take 1 tablet (10 mg total) by mouth at bedtime. 30 tablet 0   No current facility-administered medications on file prior to visit.     No Known Allergies  Past Medical History:  Diagnosis Date  . Diabetes ([place])   . HLD (hyperlipidemia)   . Hypertension   . Pernicious anemia     Past Surgical History:  Procedure Laterality Date  . ABDOMINAL HERNIA REPAIR  2009  . GASTRIC BYPASS  2000  . hemorroid surgery      Family History  Problem Relation Age of Onset  . Cancer Mother 67    unknown type cancer   . Hypertension Mother   . Hypertension Father   . Diabetes Sister   . Leukemia Brother   . Cancer Brother 81    colon cancer     Social History   Social History  .  Marital status: Divorced    Spouse name: N/A  . Number of children: N/A  . Years of education: N/A   Occupational History  . Not on file.   Social History Main Topics  . Smoking status: Never Smoker  . Smokeless tobacco: Never Used  . Alcohol use No  . Drug use: No  . Sexual activity: Not on file   Other Topics Concern  . Not on file   Social History Narrative  . No narrative on file   The PMH, PSH, Social History, Family History, Medications, and allergies have been reviewed in [facility], and have been updated if relevant.  Review of Systems     Objective:    BP 130/70   Pulse (!) 130   Temp 98.1 F (36.7 C)   Ht '5\' 1"'$  (1.549 m)   Wt 205 lb (93 kg)   SpO2 99%   BMI 38.73 kg/m    Physical Exam        Assessment & Plan:   Uncontrolled type 2 diabetes mellitus with diabetic polyneuropathy, without long-term current use of insulin (HCC)  ANEMIA, PERNICIOUS No Follow-up on file.

## 2016-08-22 ENCOUNTER — Other Ambulatory Visit: Payer: Self-pay | Admitting: Family Medicine

## 2016-08-30 ENCOUNTER — Other Ambulatory Visit: Payer: Self-pay | Admitting: Family Medicine

## 2016-09-01 ENCOUNTER — Ambulatory Visit (INDEPENDENT_AMBULATORY_CARE_PROVIDER_SITE_OTHER): Payer: BLUE CROSS/BLUE SHIELD | Admitting: Family Medicine

## 2016-09-01 ENCOUNTER — Encounter: Payer: Self-pay | Admitting: Family Medicine

## 2016-09-01 VITALS — BP 120/72 | HR 122 | Temp 98.2°F | Ht 63.0 in | Wt 204.0 lb

## 2016-09-01 DIAGNOSIS — D696 Thrombocytopenia, unspecified: Secondary | ICD-10-CM | POA: Diagnosis not present

## 2016-09-01 DIAGNOSIS — S46002A Unspecified injury of muscle(s) and tendon(s) of the rotator cuff of left shoulder, initial encounter: Secondary | ICD-10-CM

## 2016-09-01 DIAGNOSIS — IMO0002 Reserved for concepts with insufficient information to code with codable children: Secondary | ICD-10-CM

## 2016-09-01 DIAGNOSIS — E1165 Type 2 diabetes mellitus with hyperglycemia: Secondary | ICD-10-CM

## 2016-09-01 DIAGNOSIS — E785 Hyperlipidemia, unspecified: Secondary | ICD-10-CM | POA: Diagnosis not present

## 2016-09-01 DIAGNOSIS — G609 Hereditary and idiopathic neuropathy, unspecified: Secondary | ICD-10-CM | POA: Diagnosis not present

## 2016-09-01 DIAGNOSIS — G4700 Insomnia, unspecified: Secondary | ICD-10-CM

## 2016-09-01 DIAGNOSIS — I10 Essential (primary) hypertension: Secondary | ICD-10-CM

## 2016-09-01 DIAGNOSIS — E1142 Type 2 diabetes mellitus with diabetic polyneuropathy: Secondary | ICD-10-CM | POA: Diagnosis not present

## 2016-09-01 LAB — COMPREHENSIVE METABOLIC PANEL
ALK PHOS: 56 U/L (ref 39–117)
ALT: 12 U/L (ref 0–35)
AST: 12 U/L (ref 0–37)
Albumin: 4.3 g/dL (ref 3.5–5.2)
BUN: 18 mg/dL (ref 6–23)
CHLORIDE: 101 meq/L (ref 96–112)
CO2: 27 meq/L (ref 19–32)
Calcium: 9.4 mg/dL (ref 8.4–10.5)
Creatinine, Ser: 0.89 mg/dL (ref 0.40–1.20)
GFR: 67.65 mL/min (ref >60.00)
GLUCOSE: 252 mg/dL — AB (ref 70–99)
POTASSIUM: 4.5 meq/L (ref 3.5–5.1)
SODIUM: 136 meq/L (ref 135–145)
Total Bilirubin: 0.5 mg/dL (ref 0.2–1.2)
Total Protein: 7.4 g/dL (ref 6.0–8.3)

## 2016-09-01 LAB — HEMOGLOBIN A1C: HEMOGLOBIN A1C: 8.6 % — AB (ref 4.6–6.5)

## 2016-09-01 MED ORDER — GABAPENTIN 300 MG PO CAPS: ORAL_CAPSULE | ORAL | 3 refills | Status: DC

## 2016-09-01 MED ORDER — ZOLPIDEM TARTRATE 10 MG PO TABS: 10.0000 mg | ORAL_TABLET | Freq: Every day | ORAL | 1 refills | Status: DC

## 2016-09-01 NOTE — Assessment & Plan Note (Signed)
Seems to be improving- a month out from injury. Could have a partial tear but given how long ago injury was, pt and I agreed that imaging at this point likely not warranted. She is deferring on PT on this time but will let me know.

## 2016-09-01 NOTE — Assessment & Plan Note (Signed)
Persistent issue- adding gabapentin. Rx for [facility] and given to pt.

## 2016-09-01 NOTE — Assessment & Plan Note (Signed)
Deteriorated with RLS symptoms as well. Will start gabapentin 300 mg - 1-2 capsules nightly as needed for tingling/RLS. The patient indicates understanding of these issues and agrees with the plan.

## 2016-09-01 NOTE — Progress Notes (Signed)
   Subjective:   Patient ID: Kathleen Gibson, female    DOB: July 24, 1950, 66 y.o.   MRN: [id number]  Kathleen Gibson is a pleasant 66 y.o. year old female who presents to clinic today with Shoulder Injury (Pt stated injured lt shoulder pain for about 1 month) and Diabetes (follow up)  on 09/01/2016  HPI:  Shoulder pain- was shampooing her carpet about a month ago and next morning, could not lift her arm overhead. It is getting better.  Insomnia- has always had difficulty falling and staying asleep. Ambien helps her fall asleep but neuropathy and or RLS often wakes her up.   DM- has been poorly controlled for years has and has refused endo referral. She is having more tingling in her feet and fingers. Lab Results  Component Value Date   HGBA1C 8.8 (H) 05/25/2016  Currently taking Metformin 1000 mg twice daily and glyburide 5 mg daily. Not checking her FSBS.  She is on an ACEI- prinzide.    Lab Results  Component Value Date   CREATININE 0.78 05/25/2016   Lab Results  Component Value Date   CHOL 164 05/25/2016   HDL 30.60 (L) 05/25/2016   LDLCALC 110 (H) 08/27/2014   LDLDIRECT 93.0 05/25/2016   TRIG 291.0 (H) 05/25/2016   CHOLHDL 5 05/25/2016      Review of Systems  Constitutional: Negative.   HENT: Negative.   Eyes: Negative.   Respiratory: Negative.   Cardiovascular: Negative.   Gastrointestinal: Negative.   Endocrine: Negative.   Genitourinary: Negative.   Musculoskeletal: Positive for arthralgias.  Skin: Negative.   Allergic/Immunologic: Negative.   Neurological: Positive for numbness. Negative for tremors and weakness.  Hematological: Negative.   Psychiatric/Behavioral: Positive for sleep disturbance.  All other systems reviewed and are negative.      Objective:    BP 120/72   Pulse (!) 122   Temp 98.2 F (36.8 C)   Ht 5\' 3"  (1.6 m)   Wt 204 lb (92.5 kg)   SpO2 98%   BMI 36.14 kg/m    Physical Exam  Constitutional: She is oriented to person, place, and  time. She appears well-developed and well-nourished. No distress.  HENT:  Head: Normocephalic.  Eyes: Conjunctivae are normal.  Neck: Normal range of motion.  Cardiovascular: Normal rate and regular rhythm.   Pulmonary/Chest: Effort normal and breath sounds normal.  Musculoskeletal:  Left shoulder- neg empty can, pos arch  Neurological: She is alert and oriented to person, place, and time. No cranial nerve deficit.  Skin: Skin is warm and dry. She is not diaphoretic.  Psychiatric: She has a normal mood and affect. Her behavior is normal. Judgment and thought content normal.  Nursing note and vitals reviewed.         Assessment & Plan:   Thrombocytopenia ([place])  Hyperlipidemia, unspecified hyperlipidemia type  Essential hypertension  Uncontrolled type 2 diabetes mellitus with diabetic polyneuropathy, without long-term current use of insulin ([place]) No Follow-up on file.

## 2016-09-01 NOTE — Assessment & Plan Note (Signed)
Due for labs today. No changes made to rxs today.

## 2016-09-01 NOTE — Patient Instructions (Signed)
Great to see you. I will call you with your results.  Please try Gabapentin 1-2 tablets at bedtime. Please keep me updated

## 2016-10-24 ENCOUNTER — Telehealth: Payer: Self-pay

## 2016-10-24 NOTE — Telephone Encounter (Signed)
Pt has refills for glyburide and zolpidem [facility] but was advised since going on medicare will need to get prior auth on both meds. Call [phone] to start PA.

## 2016-10-24 NOTE — Telephone Encounter (Signed)
Zolpidem was approved from 06/11/16- 04/12/17  Ref # [id number] Glyburide was approved from 06/11/16 to 06/12/17.  Ref # [id number]

## 2016-10-27 NOTE — Telephone Encounter (Signed)
Left detailed message.   

## 2016-11-02 ENCOUNTER — Ambulatory Visit (INDEPENDENT_AMBULATORY_CARE_PROVIDER_SITE_OTHER): Payer: Medicare HMO | Admitting: *Deleted

## 2016-11-02 DIAGNOSIS — E538 Deficiency of other specified B group vitamins: Secondary | ICD-10-CM

## 2016-11-02 MED ORDER — CYANOCOBALAMIN 1000 MCG/ML IJ SOLN
1000.0000 ug | Freq: Once | INTRAMUSCULAR | Status: AC
  Administered 2016-11-02: 1000 ug via INTRAMUSCULAR

## 2016-11-07 ENCOUNTER — Other Ambulatory Visit: Payer: Self-pay | Admitting: Family Medicine

## 2016-11-08 NOTE — Telephone Encounter (Signed)
Faxed to [place], [place]: [phone]

## 2016-11-08 NOTE — Telephone Encounter (Signed)
Last refill 09/01/16, last OV 09/01/16

## 2016-11-16 ENCOUNTER — Encounter: Payer: Self-pay | Admitting: Family Medicine

## 2016-12-07 ENCOUNTER — Ambulatory Visit: Payer: Medicare HMO

## 2016-12-07 ENCOUNTER — Other Ambulatory Visit: Payer: Self-pay

## 2016-12-07 NOTE — Telephone Encounter (Signed)
Pt left v/m requesting refill [facility] to [facility]. Last refilled # 30 x 1 on 11/08/16. I spoke with Leah at [facility] [place] and pt does have refill but cannot refill until 12/08/16. I spoke with pt and advised and pt will ck with pharmacy on 12/08/16. Nothing further needed.

## 2016-12-30 ENCOUNTER — Other Ambulatory Visit: Payer: Self-pay | Admitting: Family Medicine

## 2017-01-04 ENCOUNTER — Other Ambulatory Visit: Payer: Self-pay | Admitting: Family Medicine

## 2017-01-05 NOTE — Telephone Encounter (Signed)
Last refill 11/08/16  Last OV 09/01/16  Ok to refill?

## 2017-01-05 NOTE — Telephone Encounter (Signed)
Faxed to  [place], [place]: [phone]

## 2017-01-06 ENCOUNTER — Other Ambulatory Visit: Payer: Self-pay

## 2017-01-06 MED ORDER — METFORMIN HCL 500 MG PO TABS: 1000.0000 mg | ORAL_TABLET | Freq: Two times a day (BID) | ORAL | 0 refills | Status: DC

## 2017-01-31 ENCOUNTER — Other Ambulatory Visit: Payer: Self-pay | Admitting: Family Medicine

## 2017-02-27 ENCOUNTER — Other Ambulatory Visit: Payer: Self-pay | Admitting: Family Medicine

## 2017-02-28 ENCOUNTER — Other Ambulatory Visit: Payer: Self-pay | Admitting: Family Medicine

## 2017-03-02 ENCOUNTER — Other Ambulatory Visit: Payer: Self-pay | Admitting: Family Medicine

## 2017-03-15 ENCOUNTER — Other Ambulatory Visit: Payer: Self-pay | Admitting: Family Medicine

## 2017-03-25 ENCOUNTER — Other Ambulatory Visit: Payer: Self-pay | Admitting: Family Medicine

## 2017-03-27 NOTE — Telephone Encounter (Signed)
Was advised in March to F/U in 3 months/asked pharm to advise pt to sched appt first/thx dmf

## 2017-03-30 ENCOUNTER — Telehealth: Payer: Self-pay | Admitting: Family Medicine

## 2017-03-30 NOTE — Telephone Encounter (Signed)
Patient has moved to [facility] area & would like to transfer to Dr. Raoul Pitch. Patient needs an appointment for medication refills so patient requests phone call when transfer has been made.

## 2017-03-30 NOTE — Telephone Encounter (Signed)
Macy with me. Please let pt know so she can schedule in a new patient slot.

## 2017-03-30 NOTE — Telephone Encounter (Signed)
I am okay with transfer.

## 2017-04-03 ENCOUNTER — Other Ambulatory Visit: Payer: Self-pay | Admitting: Family Medicine

## 2017-04-06 ENCOUNTER — Ambulatory Visit (INDEPENDENT_AMBULATORY_CARE_PROVIDER_SITE_OTHER): Payer: Medicare HMO | Admitting: Family Medicine

## 2017-04-06 ENCOUNTER — Encounter: Payer: Self-pay | Admitting: Family Medicine

## 2017-04-06 VITALS — BP 111/76 | HR 100 | Temp 98.3°F | Resp 18 | Ht 63.0 in | Wt 199.5 lb

## 2017-04-06 DIAGNOSIS — E538 Deficiency of other specified B group vitamins: Secondary | ICD-10-CM | POA: Diagnosis not present

## 2017-04-06 DIAGNOSIS — I10 Essential (primary) hypertension: Secondary | ICD-10-CM | POA: Diagnosis not present

## 2017-04-06 DIAGNOSIS — E1142 Type 2 diabetes mellitus with diabetic polyneuropathy: Secondary | ICD-10-CM

## 2017-04-06 DIAGNOSIS — G4700 Insomnia, unspecified: Secondary | ICD-10-CM

## 2017-04-06 DIAGNOSIS — E118 Type 2 diabetes mellitus with unspecified complications: Secondary | ICD-10-CM

## 2017-04-06 DIAGNOSIS — E785 Hyperlipidemia, unspecified: Secondary | ICD-10-CM | POA: Diagnosis not present

## 2017-04-06 DIAGNOSIS — R5383 Other fatigue: Secondary | ICD-10-CM | POA: Diagnosis not present

## 2017-04-06 DIAGNOSIS — E1165 Type 2 diabetes mellitus with hyperglycemia: Secondary | ICD-10-CM

## 2017-04-06 DIAGNOSIS — D510 Vitamin B12 deficiency anemia due to intrinsic factor deficiency: Secondary | ICD-10-CM | POA: Diagnosis not present

## 2017-04-06 DIAGNOSIS — D696 Thrombocytopenia, unspecified: Secondary | ICD-10-CM | POA: Diagnosis not present

## 2017-04-06 LAB — TSH: TSH: 1.55 u[IU]/mL (ref 0.35–4.50)

## 2017-04-06 LAB — VITAMIN B12: Vitamin B-12: 720 pg/mL (ref 211–911)

## 2017-04-06 LAB — POCT GLYCOSYLATED HEMOGLOBIN (HGB A1C): HEMOGLOBIN A1C: 8.3

## 2017-04-06 MED ORDER — METFORMIN HCL 500 MG PO TABS: 1000.0000 mg | ORAL_TABLET | Freq: Two times a day (BID) | ORAL | 3 refills | Status: DC

## 2017-04-06 MED ORDER — LISINOPRIL-HYDROCHLOROTHIAZIDE 20-25 MG PO TABS: 1.0000 | ORAL_TABLET | Freq: Every day | ORAL | 0 refills | Status: DC

## 2017-04-06 MED ORDER — GLYBURIDE 5 MG PO TABS: 5.0000 mg | ORAL_TABLET | Freq: Two times a day (BID) | ORAL | 0 refills | Status: DC

## 2017-04-06 MED ORDER — GABAPENTIN 300 MG PO CAPS: ORAL_CAPSULE | ORAL | 1 refills | Status: DC

## 2017-04-06 MED ORDER — ZOLPIDEM TARTRATE 10 MG PO TABS: 10.0000 mg | ORAL_TABLET | Freq: Every day | ORAL | 2 refills | Status: DC

## 2017-04-06 MED ORDER — SITAGLIP PHOS-METFORMIN HCL ER 100-1000 MG PO TB24: 1.0000 | ORAL_TABLET | Freq: Every day | ORAL | 1 refills | Status: DC

## 2017-04-06 NOTE — Progress Notes (Signed)
Patient ID: Kathleen Gibson, female  DOB: [birth year], 66 y.o.   MRN: [id number] Patient Care Team    Relationship Specialty Notifications Start End  [name], DO PCP - General Family Medicine  04/06/17   Doran Stabler, MD Consulting Physician Gastroenterology  04/06/17   Truitt Merle, MD Consulting Physician Hematology  04/06/17     Chief Complaint  Patient presents with  . Establish Care  . Diabetes  . Hypertension    Subjective:  Kathleen Gibson is a 66 y.o.  female present for new patient establishment. All past medical history, surgical history, allergies, family history, immunizations, medications and social history were Obtained and entered in the electronic medical record today. All recent labs, ED visits and hospitalizations within the last year were reviewed.  Diabetes: Pt reports compliance with metformin 1000 mg twice a day. She is also prescribed glyburide which she states she only remembers to take once a day typically. Patient has restless leg and chronic numbness and tingling of her lower extremity. She reports difficulty trimming her toenails and frequently causes injury. She has had hypoglycemic events in the past, however that was a very long time ago. Currently her blood sugars are in the high 100s-225. Her last A1c was 8.9  PNA series: Pneumovax 23 completed 2009, pneumococcal 13 completed October 2016. Series will need to be repeated now that patient is over 65. We'll offer Pneumovax 23 Office visit. Flu shot: Patient declined (recommneded yearly) BMP: Reviewed BMP collected March 2018, within normal limits, with the exception of elevated glucose to 252 Foot exam: Completed today Eye exam: Patient reports her last eye exam was October 2017. She did receive a notice to reschedule her exam this year. She forgets her eye doctor's name. A1c:   Hypertension/hypertriglyceridemia: Pt reports compliance with lisinopril-HCTZ 20-25 milligrams daily. Blood pressures  ranges at home within normal limits. Patient denies chest pain, shortness of breath or lower extremity edema. Pt does not take a daily baby ASA. Pt is not prescribed statin. BMP: 09/01/2016 within normal limits with the exception of elevated glucose CBC: May 25 2016 with mild anemia hemoglobin 11.4, platelets 103 (chronic thrombocytopenia) Lipids: December 2017 elevated triglycerides 291, low HDL 30, LDL and cholesterol normal. Diet: Does not watch routinely Exercise: Does not exercise routinely RF: Hypertension, diabetes, hyperlipidemia, obesity   B-12 deficiency/pernicious anemia: Patient reports she has been B-12 deficient with anemia for many years. She has had a gastric bypass in the past. She received B-12 injections monthly until May 2018. She reports she feels much better when she is routinely receiving the injections. It was difficult for her to get to her doctor's office monthly.  Depression screen [facility] 2/9 04/06/2017 08/27/2014  Decreased Interest 0 0  Down, Depressed, Hopeless 0 0  PHQ - 2 Score 0 0   No flowsheet data found.    Current Exercise Habits: The patient does not participate in regular exercise at present Exercise limited by: None identified Fall Risk  04/06/2017  Falls in the past year? No     Immunization History  Administered Date(s) Administered  . Pneumococcal Conjugate-13 03/19/2015  . Pneumococcal Polysaccharide-23 06/03/2008  . Td 06/13/2006    No exam data present  Past Medical History:  Diagnosis Date  . Diabetes ([place])   . HLD (hyperlipidemia)   . Hypertension   . Pernicious anemia    No Known Allergies Past Surgical History:  Procedure Laterality Date  . ABDOMINAL HERNIA REPAIR  2009  . ABDOMINOPLASTY    . [place]  . GASTRIC BYPASS  2000  . hemorroid surgery  2007  . TONSILLECTOMY AND ADENOIDECTOMY  1957   Family History  Problem Relation Age of Onset  . Cancer Mother 24       unknown type cancer   .  Hypertension Mother   . Hypertension Father   . Diabetes Sister   . Leukemia Brother   . Colon cancer Brother 42   Social History   Social History  . Marital status: Divorced    Spouse name: N/A  . Number of children: 3  . Years of education: 12   Occupational History  . Retired    Social History Main Topics  . Smoking status: Never Smoker  . Smokeless tobacco: Never Used  . Alcohol use No  . Drug use: No  . Sexual activity: No   Other Topics Concern  . Not on file   Social History Narrative   Divorced. Retired Pension scheme manager.   12th grade education.   Drinks caffeine.   Smoke alarm in the home. Wears her seatbelt.   Feels safe in her relationships.   Allergies as of 04/06/2017   No Known Allergies     Medication List       Accurate as of 04/06/17  5:05 PM. Always use your most recent med list.          gabapentin 300 MG capsule Commonly known as:  NEURONTIN 1-2 TABLETS AT BEDTIME AS NEEDED FOR NEUROPATHY AND OR RLS   glucose monitoring kit monitoring kit 1 each by Does not apply route as needed for other. Use as directed   glyBURIDE 5 MG tablet Commonly known as:  DIABETA Take 1 tablet (5 mg total) by mouth 2 (two) times daily with a meal.   lisinopril-hydrochlorothiazide 20-25 MG tablet Commonly known as:  PRINZIDE,ZESTORETIC Take 1 tablet by mouth daily.   metFORMIN 500 MG tablet Commonly known as:  GLUCOPHAGE Take 2 tablets (1,000 mg total) by mouth 2 (two) times daily with a meal.   SitaGLIPtin-MetFORMIN HCl 989-128-9000 MG Tb24 Take 1 tablet by mouth daily.   zolpidem 10 MG tablet Commonly known as:  AMBIEN Take 1 tablet (10 mg total) by mouth at bedtime.       All past medical history, surgical history, allergies, family history, immunizations andmedications were updated in the EMR today and reviewed under the history and medication portions of their EMR.    Recent Results (from the past 2160 hour(s))  POCT glycosylated hemoglobin  (Hb A1C)     Status: Abnormal   Collection Time: 04/06/17 10:24 AM  Result Value Ref Range   Hemoglobin A1C 8.3   TSH     Status: None   Collection Time: 04/06/17 11:00 AM  Result Value Ref Range   TSH 1.55 0.35 - 4.50 uIU/mL  B12     Status: None   Collection Time: 04/06/17 11:00 AM  Result Value Ref Range   Vitamin B-12 720 211 - 911 pg/mL     ROS: 14 pt review of systems performed and negative (unless mentioned in an HPI)  Objective: BP 111/76 (BP Location: Left Arm, Patient Position: Sitting, Cuff Size: Large)   Pulse 100   Temp 98.3 F (36.8 C)   Resp 18   Ht _0  (1.6 m)   Wt 199 lb 8 oz (90.5 kg)   SpO2 98%   BMI 35.34 kg/m  Gen: Afebrile. No  acute distress. Nontoxic in appearance, well-developed, well-nourished,  obese, pleasant Caucasian female. HENT: AT. [place]. MMM, no oral lesions, adequate dentition.  Eyes:Pupils Equal Round Reactive to light, Extraocular movements intact,  Conjunctiva without redness, discharge or icterus. Neck/lymp/endocrine: Supple, no lymphadenopathy, no thyromegaly CV: RRR no murmur, no edema, +2/4 P posterior tibialis pulses. No carotid bruits. No JVD. Chest: CTAB, no wheeze, rhonchi or crackles. Normal Respiratory effort. Good Air movement. Abd: Soft. Obese. NTND. BS present.  Skin:Skin intact. Neuro/Msk:  Normal gait. PERLA. EOMi. Alert. Oriented x3.   Psych: Normal affect, dress and demeanor. Normal speech. Normal thought content and judgment. Diabetic Foot Exam - Simple   Simple Foot Form Diabetic Foot exam was performed with the following findings:  Yes 04/06/2017 12:04 PM  Visual Inspection No deformities, no ulcerations, no other skin breakdown bilaterally:  Yes Sensation Testing Intact to touch and monofilament testing bilaterally:  Yes Pulse Check Posterior Tibialis and Dorsalis pulse intact bilaterally:  Yes Comments Thickened discolored nails present, long nails present.      Assessment/plan: Kathleen Gibson is a 66 y.o.  female present for establishment of care (from [place]) and chronic medical conditions. Essential hypertension/hyperlipidemia - Blood pressure stable. Continue lisinopril- HCTZ, this is refilled for her today. - Low-sodium diet, exercise. 150 minutes a week - Patient was encouraged to start a baby aspirin if able to tolerate, and start 1000 mg of fish oil daily. - TSH - B12 - Iron, TIBC and Ferritin Panel - Follow-up 3 months  Diabetic polyneuropathy associated with type 2 diabetes mellitus ([place]) Uncontrolled type 2 diabetes mellitus with hyperglycemia ([place]) - Uncontrolled diabetes. Patient has difficulty remembering to take the afternoon dose of glyburide, although she remembers to take the afternoon dose of metformin. I suspect she is more fearful of the glyburide because she sees more of a shift in her blood sugar and she has had hypoglycemic events in the past. She had been on Janumet in the past and reports she did well on that medication, however her insurance at that time would not cover. She has no insurance now, will try to see if it is more affordable option for her that she would only have to take 1 pill once a day. Janumet is covered, patient is to start Janumet and discontinue metformin and the glyburide. PNA series: Pneumovax 23 completed 2009, pneumococcal 13 completed  2016. Series will need to be repeated now that patient is over 65. We'll offer Pneumovax 23 Office visit. Flu shot: Patient declined (recommneded yearly) BMP: Normal up-to-date, follow yearly  Foot exam: Completed today Eye exam: Patient due for eye exam now.  - POCT glycosylated hemoglobin (Hb A1C) - TSH - Ambulatory referral to Podiatry - Follow-up 3 months  INSOMNIA, CHRONIC Patient reports her insomnia is chronic. She has been on Ambien 10 mg daily at bedtime for many years. She reports without the medication she has really cannot sleep. - Discussed with her the safety profile and this medication and  patient > 29 years old. This is not an ideal medicine. Patient voiced understanding. Given that she is a new patient I do not want to change too many medications on the same visit. Therefore refilled her Ambien today with the caveat that we will revisit this on her next appointment and consider other options, which include increasing gabapentin she is already on versus other potential safer medications.  Thrombocytopenia (HCC) Chronic. Stable. Followed by hematology.  ANEMIA, PERNICIOUS/B 12 deficiency/history gastric bypass/fatigue/cold fingertips-cold intolerance - Discussed with  patient we would be happy to restart her monthly B-12 injections. Given she's had a gastric bypass she will likely need be lifelong. Will await B-12 levels to be thorough. Also check levels of thyroid and iron given recent cold intolerance, cold fingertips and fatigue. - TSH - B12 - Iron, TIBC and Ferritin Panel   Return in about 3 months (around 07/07/2017) for DM/HTN.   Note is dictated utilizing voice recognition software. Although note has been proof read prior to signing, occasional typographical errors still can be missed. If any questions arise, please do not hesitate to call for verification.  Electronically signed by: Howard Pouch, DO [place]

## 2017-04-06 NOTE — Patient Instructions (Signed)
Continue diabetic meds as ordered for now.  I will refill your medications today (by the end of the day) We will call you with  Lab results and discuss further plan then We will try to change your diabetes meds but it will take some work with insurance.  Start fish oil 1000 mg daily for cholesterol.  I will refer you to podiatry.    Follow up 3 months   Please help [place] help you:  We are honored you have chosen [facility] for your Primary Care home. Below you will find basic instructions that you may need to access in the future. Please help [place] help you by reading the instructions, which cover many of the frequent questions we experience.   Prescription refills and request:  -In order to allow more efficient response time, please call your pharmacy for all refills. They will forward the request electronically to [place]. This allows for the quickest possible response. Request left on a nurse line can take longer to refill, since these are checked as time allows between office patients and other phone calls.  - refill request can take up to 3-5 working days to complete.  - If request is sent electronically and request is appropiate, it is usually completed in 1-2 business days.  - all patients will need to be seen routinely for all chronic medical conditions requiring prescription medications (see follow-up below). If you are overdue for follow up on your condition, you will be asked to make an appointment and we will call in enough medication to cover you until your appointment (up to 30 days).  - all controlled substances will require a face to face visit to request/refill.  - if you desire your prescriptions to go through a new pharmacy, and have an active script at original pharmacy, you will need to call your pharmacy and have scripts transferred to new pharmacy. This is completed between the pharmacy locations and not by your provider.    Results: If any images or labs were ordered, it can  take up to 1 week to get results depending on the test ordered and the lab/facility running and resulting the test. - Normal or stable results, which do not need further discussion, may be released to your mychart immediately with attached note to you. A call may not be generated for normal results. Please make certain to sign up for mychart. If you have questions on how to activate your mychart you can call the front office.  - If your results need further discussion, our office will attempt to contact you via phone, and if unable to reach you after 2 attempts, we will release your abnormal result to your mychart with instructions.  - All results will be automatically released in mychart after 1 week.  - Your provider will provide you with explanation and instruction on all relevant material in your results. Please keep in mind, results and labs may appear confusing or abnormal to the untrained eye, but it does not mean they are actually abnormal for you personally. If you have any questions about your results that are not covered, or you desire more detailed explanation than what was provided, you should make an appointment with your provider to do so.   Our office handles many outgoing and incoming calls daily. If we have not contacted you within 1 week about your results, please check your mychart to see if there is a message first and if not, then contact our office.  In helping with this matter, you help decrease call volume, and therefore allow [place] to be able to respond to patients needs more efficiently.   Acute office visits (sick visit):  An acute visit is intended for a new problem and are scheduled in shorter time slots to allow schedule openings for patients with new problems. This is the appropriate visit to discuss a new problem. In order to provide you with excellent quality medical care with proper time for you to explain your problem, have an exam and receive treatment with instructions,  these appointments should be limited to one new problem per visit. If you experience a new problem, in which you desire to be addressed, please make an acute office visit, we save openings on the schedule to accommodate you. Please do not save your new problem for any other type of visit, let us take care of it properly and quickly for you.   Follow up visits:  Depending on your condition(s) your provider will need to see you routinely in order to provide you with quality care and prescribe medication(s). Most chronic conditions (Example: hypertension, Diabetes, depression/anxiety... etc), require visits a couple times a year. Your provider will instruct you on proper follow up for your personal medical conditions and history. Please make certain to make follow up appointments for your condition as instructed. Failing to do so could result in lapse in your medication treatment/refills. If you request a refill, and are overdue to be seen on a condition, we will always provide you with a 30 day script (once) to allow you time to schedule.    Medicare wellness (well visit): - we have a wonderful Nurse Maudie Mercury), that will meet with you and provide you will yearly medicare wellness visits. These visits should occur yearly (can not be scheduled less than 1 calendar year apart) and cover preventive health, immunizations, advance directives and screenings you are entitled to yearly through your medicare benefits. Do not miss out on your entitled benefits, this is when medicare will pay for these benefits to be ordered for you.  These are strongly encouraged by your provider and is the appropriate type of visit to make certain you are up to date with all preventive health benefits. If you have not had your medicare wellness exam in the last 12 months, please make certain to schedule one by calling the office and schedule your medicare wellness with Maudie Mercury as soon as possible.   Yearly physical (well visit):  - Adults are  recommended to be seen yearly for physicals. Check with your insurance and date of your last physical, most insurances require one calendar year between physicals. Physicals include all preventive health topics, screenings, medical exam and labs that are appropriate for gender/age and history. You may have fasting labs needed at this visit. This is a well visit (not a sick visit), new problems should not be covered during this visit (see acute visit).  - Pediatric patients are seen more frequently when they are younger. Your provider will advise you on well child visit timing that is appropriate for your their age. - This is not a medicare wellness visit. Medicare wellness exams do not have an exam portion to the visit. Some medicare companies allow for a physical, some do not allow a yearly physical. If your medicare allows a yearly physical you can schedule the medicare wellness with our nurse Maudie Mercury and have your physical with your provider after, on the same day. Please check with insurance for  your full benefits.   Late Policy/No Shows:  - all new patients should arrive 15-30 minutes earlier than appointment to allow [place] time  to  obtain all personal demographics,  insurance information and for you to complete office paperwork. - All established patients should arrive 10-15 minutes earlier than appointment time to update all information and be checked in .  - In our best efforts to run on time, if you are late for your appointment you will be asked to either reschedule or if able, we will work you back into the schedule. There will be a wait time to work you back in the schedule,  depending on availability.  - If you are unable to make it to your appointment as scheduled, please call 24 hours ahead of time to allow [place] to fill the time slot with someone else who needs to be seen. If you do not cancel your appointment ahead of time, you may be charged a no show fee.

## 2017-04-06 NOTE — Telephone Encounter (Signed)
Patient scheduled 04/06/17

## 2017-04-07 LAB — IRON,TIBC AND FERRITIN PANEL
%SAT: 9 % (calc) — ABNORMAL LOW (ref 11–50)
Ferritin: 13 ng/mL — ABNORMAL LOW (ref 20–288)
IRON: 38 ug/dL — AB (ref 45–160)
TIBC: 425 ug/dL (ref 250–450)

## 2017-04-10 ENCOUNTER — Telehealth: Payer: Self-pay | Admitting: Family Medicine

## 2017-04-10 DIAGNOSIS — E611 Iron deficiency: Secondary | ICD-10-CM

## 2017-04-10 MED ORDER — FERROUS SULFATE 325 (65 FE) MG PO TABS: 325.0000 mg | ORAL_TABLET | Freq: Two times a day (BID) | ORAL | 0 refills | Status: DC

## 2017-04-10 NOTE — Telephone Encounter (Signed)
Spoke with patient reviewed results and instructions. Patient verbalized understanding . She will pick up FOBT cards. Patient states she has not had any rectal bleeding and has never had a colonoscopy. She has not picked up her Janumet yet she will let us know if its too expensive . Told patient if she takes janumet let us know so I can update her medication list. Patient knows to dc metformin and glyburide if taking Janumet.

## 2017-04-10 NOTE — Telephone Encounter (Signed)
Please call patient: - Her labs resulted with iron deficiency. She will need iron supplementation, this could be secondary to malabsorption secondary to gastric bypass or blood loss.   - Has she has any rectal bleeding?   - Has she had a colonoscopy? If so when? Who? Records please. Please send FOBT cards to her an explain.    - I have called in iron supplement for her to start twice a day. Make sure to take a stool softener if case of constipation side effect.   Her b12 is actually good at 720 and does not need replaced by injection (if her last injection was May). Fatigue/cold sensation is likely coming from her low iron.    Lastly, did she get the Janumet I prescribed? Is it an affordable option in place of the glyburide/metformin. IF She is using the Janumet, please discontinue the metformin and glyburide In the system. Make sure patient discontinues metformin and glyburide if using  Janumet

## 2017-04-21 ENCOUNTER — Other Ambulatory Visit: Payer: Medicare HMO

## 2017-04-24 ENCOUNTER — Telehealth: Payer: Self-pay | Admitting: Family Medicine

## 2017-04-24 ENCOUNTER — Other Ambulatory Visit: Payer: Medicare HMO

## 2017-04-24 DIAGNOSIS — E611 Iron deficiency: Secondary | ICD-10-CM

## 2017-04-24 LAB — HEMOCCULT SLIDES (X 3 CARDS)
FECAL OCCULT BLD: NEGATIVE
OCCULT 1: NEGATIVE
OCCULT 2: NEGATIVE
OCCULT 3: NEGATIVE
OCCULT 4: NEGATIVE
OCCULT 5: NEGATIVE

## 2017-04-24 NOTE — Addendum Note (Signed)
Addended by: Leota Jacobsen on: 04/24/2017 02:23 PM   Modules accepted: Orders

## 2017-04-24 NOTE — Telephone Encounter (Signed)
Please call patient: Her FOBT cards are negative for  blood. I would recommend she have a colonoscopy completed given she's never had a screening. Continue the iron as prescribed. - If she is agreeable I will refer to gastroenterology so that she can be seen to set up a colonoscopy.

## 2017-04-25 NOTE — Telephone Encounter (Signed)
Spoke with patient reviewed lab results and instructions. Patient verbalized understanding.Patient states she is in [place] for the next 4 weeks . She states she doesn't really want to do a colonoscopy but she will let us know at her next follow up appt if she changes her mind.

## 2017-05-23 ENCOUNTER — Ambulatory Visit: Payer: Self-pay | Admitting: Podiatry

## 2017-06-15 ENCOUNTER — Ambulatory Visit (INDEPENDENT_AMBULATORY_CARE_PROVIDER_SITE_OTHER): Payer: Medicare HMO | Admitting: Podiatry

## 2017-06-15 ENCOUNTER — Encounter: Payer: Self-pay | Admitting: Podiatry

## 2017-06-15 VITALS — BP 95/76 | HR 93 | Ht 63.0 in | Wt 200.0 lb

## 2017-06-15 DIAGNOSIS — E119 Type 2 diabetes mellitus without complications: Secondary | ICD-10-CM

## 2017-06-15 NOTE — Progress Notes (Signed)
   Subjective:    Patient ID: Kathleen Gibson, female    DOB: [birth year], 67 y.o.   MRN: [id number]  HPI  Chief Complaint  Patient presents with  . Diabetes    New patient diabetic foot exam. last A1C   8.6       Review of Systems  All other systems reviewed and are negative.      Objective:   Physical Exam        Assessment & Plan:

## 2017-07-06 ENCOUNTER — Encounter: Payer: Self-pay | Admitting: *Deleted

## 2017-07-11 ENCOUNTER — Ambulatory Visit (INDEPENDENT_AMBULATORY_CARE_PROVIDER_SITE_OTHER): Payer: Medicare HMO | Admitting: Family Medicine

## 2017-07-11 ENCOUNTER — Encounter: Payer: Self-pay | Admitting: Family Medicine

## 2017-07-11 VITALS — BP 93/66 | HR 92 | Temp 98.1°F | Resp 18 | Ht 63.0 in | Wt 196.5 lb

## 2017-07-11 DIAGNOSIS — I10 Essential (primary) hypertension: Secondary | ICD-10-CM

## 2017-07-11 DIAGNOSIS — E1142 Type 2 diabetes mellitus with diabetic polyneuropathy: Secondary | ICD-10-CM

## 2017-07-11 DIAGNOSIS — Z23 Encounter for immunization: Secondary | ICD-10-CM | POA: Diagnosis not present

## 2017-07-11 DIAGNOSIS — E1165 Type 2 diabetes mellitus with hyperglycemia: Secondary | ICD-10-CM | POA: Diagnosis not present

## 2017-07-11 DIAGNOSIS — E785 Hyperlipidemia, unspecified: Secondary | ICD-10-CM | POA: Diagnosis not present

## 2017-07-11 DIAGNOSIS — E119 Type 2 diabetes mellitus without complications: Secondary | ICD-10-CM

## 2017-07-11 LAB — POCT GLYCOSYLATED HEMOGLOBIN (HGB A1C): Hemoglobin A1C: 7.9

## 2017-07-11 MED ORDER — ZOLPIDEM TARTRATE 10 MG PO TABS: 10.0000 mg | ORAL_TABLET | Freq: Every day | ORAL | 2 refills | Status: DC

## 2017-07-11 MED ORDER — GABAPENTIN 300 MG PO CAPS: ORAL_CAPSULE | ORAL | 1 refills | Status: DC

## 2017-07-11 MED ORDER — LISINOPRIL-HYDROCHLOROTHIAZIDE 20-25 MG PO TABS: 1.0000 | ORAL_TABLET | Freq: Every day | ORAL | 1 refills | Status: DC

## 2017-07-11 MED ORDER — SITAGLIP PHOS-METFORMIN HCL ER 100-1000 MG PO TB24: 1.0000 | ORAL_TABLET | Freq: Every day | ORAL | 1 refills | Status: DC

## 2017-07-11 NOTE — Progress Notes (Signed)
Patient ID: Kathleen Gibson, female  DOB: [birth year], 67 y.o.   MRN: [id number] Patient Care Team    Relationship Specialty Notifications Start End  [name], DO PCP - General Family Medicine  04/06/17   Doran Stabler, MD Consulting Physician Gastroenterology  04/06/17   Truitt Merle, MD Consulting Physician Hematology  04/06/17     Chief Complaint  Patient presents with  . Diabetes  . Hypertension    Subjective:  Kathleen Gibson is a 67 y.o.  female present for Diabetes: Pt reports compliance with janumet. Patient has restless leg and chronic numbness and tingling of her lower extremity. Blood sugars have been in the normal ranges except once. Patient denies dizziness, hyperglycemic or hypoglycemic events. Patient denies  nonhealing wounds of feet.  PNA series: Pneumovax 23 completed 2009, pneumococcal 13 completed October 2016. Series will need to be repeated now that patient is over 65. We'll offer Pneumovax 23 Office visit. Flu shot: Patient declined (recommneded yearly) BMP: Reviewed BMP collected March 2018, within normal limits, with the exception of elevated glucose to 252 Foot exam: 06/15/2017 Eye exam: Patient reports her last eye exam was October 2017. She did receive a notice to reschedule her exam this year. She forgets her eye doctor's name. A1c: 8..6--> 8.3--> 7.9  Hypertension/hypertriglyceridemia: Pt reports compliance  with lisinopril-HCTZ 20-25 milligrams daily. Blood pressures ranges at home are not routinely checked. Patient denies chest pain, shortness of breath, dizziness or lower extremity edema.  Pt does not take a daily baby ASA. Pt is not prescribed statin. BMP: 09/01/2016 within normal limits with the exception of elevated glucose CBC: May 25 2016 with mild anemia hemoglobin 11.4, platelets 103 (chronic thrombocytopenia) Lipids: December 2017 elevated triglycerides 291, low HDL 30, LDL and cholesterol normal. Diet: Does not watch  routinely Exercise: Does not exercise routinely RF: Hypertension, diabetes, hyperlipidemia, obesity    Depression screen [facility] 2/9 04/06/2017 08/27/2014  Decreased Interest 0 0  Down, Depressed, Hopeless 0 0  PHQ - 2 Score 0 0   No flowsheet data found.        Fall Risk  04/06/2017  Falls in the past year? No     Immunization History  Administered Date(s) Administered  . Pneumococcal Conjugate-13 03/19/2015  . Pneumococcal Polysaccharide-23 06/03/2008, 07/11/2017  . Td 06/13/2006    No exam data present  Past Medical History:  Diagnosis Date  . Diabetes ([place])   . HLD (hyperlipidemia)   . Hypertension   . Pernicious anemia    No Known Allergies Past Surgical History:  Procedure Laterality Date  . ABDOMINAL HERNIA REPAIR  2009  . ABDOMINOPLASTY    . [place]  . GASTRIC BYPASS  2000  . hemorroid surgery  2007  . TONSILLECTOMY AND ADENOIDECTOMY  1957   Family History  Problem Relation Age of Onset  . Cancer Mother 15       unknown type cancer   . Hypertension Mother   . Hypertension Father   . Diabetes Sister   . Leukemia Brother   . Colon cancer Brother 20   Social History   Socioeconomic History  . Marital status: Divorced    Spouse name: Not on file  . Number of children: 3  . Years of education: 76  . Highest education level: Not on file  Social Needs  . Financial resource strain: Not on file  . Food insecurity - worry: Not on file  . Food insecurity -  inability: Not on file  . Transportation needs - medical: Not on file  . Transportation needs - non-medical: Not on file  Occupational History  . Occupation: Retired  Tobacco Use  . Smoking status: Never Smoker  . Smokeless tobacco: Never Used  Substance and Sexual Activity  . Alcohol use: No  . Drug use: No  . Sexual activity: No  Other Topics Concern  . Not on file  Social History Narrative   Divorced. Retired Pension scheme manager.   12th grade education.   Drinks  caffeine.   Smoke alarm in the home. Wears her seatbelt.   Feels safe in her relationships.   Allergies as of 07/11/2017   No Known Allergies     Medication List        Accurate as of 07/11/17 10:29 AM. Always use your most recent med list.          ferrous sulfate 325 (65 FE) MG tablet Take 1 tablet (325 mg total) by mouth 2 (two) times daily with a meal.   Fish Oil 1000 MG Caps Take 1 capsule by mouth daily.   gabapentin 300 MG capsule Commonly known as:  NEURONTIN 1-2 TABLETS AT BEDTIME AS NEEDED FOR NEUROPATHY AND OR RLS   glucose monitoring kit monitoring kit 1 each by Does not apply route as needed for other. Use as directed   lisinopril-hydrochlorothiazide 20-25 MG tablet Commonly known as:  PRINZIDE,ZESTORETIC Take 1 tablet by mouth daily.   SitaGLIPtin-MetFORMIN HCl (939) [id number] MG Tb24 Take 1 tablet by mouth daily.   zolpidem 10 MG tablet Commonly known as:  AMBIEN Take 1 tablet (10 mg total) by mouth at bedtime.       All past medical history, surgical history, allergies, family history, immunizations andmedications were updated in the EMR today and reviewed under the history and medication portions of their EMR.    Recent Results (from the past 2160 hour(s))  Hemoccult Cards (X3 cards)     Status: None   Collection Time: 04/24/17  3:08 PM  Result Value Ref Range   OCCULT 1 Negative Negative   OCCULT 2 Negative Negative   OCCULT 3 Negative Negative   OCCULT 4 Negative Negative   OCCULT 5 Negative Negative   Fecal Occult Blood Negative Negative  POCT glycosylated hemoglobin (Hb A1C)     Status: Abnormal   Collection Time: 07/11/17  9:53 AM  Result Value Ref Range   Hemoglobin A1C 7.9      ROS: 14 pt review of systems performed and negative (unless mentioned in an HPI)  Objective: BP 93/66 (BP Location: Left Arm, Patient Position: Sitting, Cuff Size: Large)   Pulse 92   Temp 98.1 F (36.7 C)   Resp 18   Ht '5\' 3"'$  (1.6 m)   Wt 196 lb 8 oz (89.1  kg)   SpO2 98%   BMI 34.81 kg/m  Gen: Afebrile. No acute distress.  HENT: AT. [place].  MMM.  Eyes:Pupils Equal Round Reactive to light, Extraocular movements intact,  Conjunctiva without redness, discharge or icterus. CV: RRR no murmur, no edema, +2/4 P posterior tibialis pulses Chest: CTAB, no wheeze or crackles Abd: Soft. NTND. BS present. No  Masses palpated.  Skin: no rashes, purpura or petechiae.  Neuro:  Normal gait. PERLA. EOMi. Alert. Oriented x3  Assessment/plan: KOLBY MYUNG is a 67 y.o. female present for establishment of care (from [place]) and chronic medical conditions. Essential hypertension/hyperlipidemia - Stable, but lower end. Intermittent dizziness with standing. Trial of  1/2 tab lisinopril-Hctz (total dose 10-12.5). Pt to monitor BP and if < 130/85 then continue at half tab. If above she will return to full dose and if dizziness worsens then she weill need to follow up and change BP med.  - she has been steadily losing weight and may need less BP med.  - She does endorse LE edema without HCTZ--> would need to keep HCTZ portion. - labs will be collected next visit.  - Low-sodium diet, exercise. 150 minutes a week - Patient was encouraged to start a baby aspirin if able to tolerate, and start 1000 mg of fish oil daily. - Follow-up 3 months  Diabetic polyneuropathy associated with type 2 diabetes mellitus ([place]) Uncontrolled type 2 diabetes mellitus with hyperglycemia ([place]) - gaining better control. 8.6--> 8.3--> 7.9 - continue to decrease carb and sugar. Increase exercise.  - continue Janumet [id number] mg QD. Refills provided today - PNA: series completed 07/11/2017 - Flu shot: Patient declined (recommneded yearly) BMP: Normal up-to-date, follow yearly --> labs due next visit.  Foot exam: completed at podiatry 06/15/2017 Eye exam: Patient due for eye exam now.  - Follow-up 3 months  INSOMNIA, CHRONIC - chronic comtroled on ambien 10 mg. Not ideal dosing for age.  - next  appt will discuss increasing gabapentin and decreasing dose of Ambien.  - Discussed with her the safety profile and this medication and patient > 15 years old.   Thrombocytopenia (HCC) Chronic. Stable. Followed by hematology.  ANEMIA, PERNICIOUS/B 12 deficiency/history gastric bypass/fatigue/cold fingertips-cold intolerance - iron levels low, now taking iron supplement once a day. She is unable to tolerate more.  - Repeat levels with labs on next appt. If not improved she is established with hematology for thrombocytopenia, could consider iron transfusions.    Return in about 3 months (around 10/18/2017).   Note is dictated utilizing voice recognition software. Although note has been proof read prior to signing, occasional typographical errors still can be missed. If any questions arise, please do not hesitate to call for verification.  Electronically signed by: Howard Pouch, DO [place]

## 2017-07-11 NOTE — Patient Instructions (Signed)
Blood pressure looks good, but it is a little low the last few visit. Try taking half tab of the lisinopril HCTZ combo. Monitor BP and as long as below 130/85 continue at half dose. If rises above will need to return to full dose. If you become dizzy on full dose then please make an appt and we will need to discuss changing medicines.    Diabetes Your last a1c was 7.9 Your goal is to have an A1c < 7 Medicine: continue current regimen.  Come back to see [place] in: 12 weeks  What every diabetic needs to do:  - Follow a diabetic diet, making healthy food choices.  - Attempt to exercise at least 150 minutes a week if physically able.  - You need to have your Diabetic eye exam completed by a Opthalmologist every year!      If you have not had your eyes examined this year, please schedule an appointment    with your eye doctor immediately. Please ask you eye doctor to send/fax a copy of    your yearly eye exams to [place], so we may keep your chart updated and help you    remember when it is time to see an eye doctor. - Get in the habit of taking off your shoes EVERY diabetes appointment, so we may   inspect your feet. If you get any foot sores, you should be seen immediately.

## 2017-07-14 ENCOUNTER — Other Ambulatory Visit: Payer: Self-pay | Admitting: *Deleted

## 2017-07-14 MED ORDER — FERROUS SULFATE 325 (65 FE) MG PO TABS: 325.0000 mg | ORAL_TABLET | Freq: Two times a day (BID) | ORAL | 0 refills | Status: DC

## 2017-07-20 ENCOUNTER — Encounter: Payer: Self-pay | Admitting: Family Medicine

## 2017-09-04 ENCOUNTER — Other Ambulatory Visit: Payer: Self-pay | Admitting: *Deleted

## 2017-09-04 MED ORDER — FERROUS SULFATE 325 (65 FE) MG PO TABS: 325.0000 mg | ORAL_TABLET | Freq: Two times a day (BID) | ORAL | 0 refills | Status: DC

## 2017-10-04 ENCOUNTER — Telehealth: Payer: Self-pay | Admitting: Family Medicine

## 2017-10-04 DIAGNOSIS — W57XXXA Bitten or stung by nonvenomous insect and other nonvenomous arthropods, initial encounter: Secondary | ICD-10-CM | POA: Diagnosis not present

## 2017-10-04 DIAGNOSIS — S2096XA Insect bite (nonvenomous) of unspecified parts of thorax, initial encounter: Secondary | ICD-10-CM | POA: Diagnosis not present

## 2017-10-04 NOTE — Telephone Encounter (Signed)
Copied from [place] [id number]. Topic: Quick Communication - See Telephone Encounter >> Oct 04, 2017  8:20 AM Hewitt Shorts wrote: CRM for notification. See Telephone encounter for: 10/04/17.pt is out of town in [place] and has pulled a tick off  and was wanting to know if possible could Dr. Raoul Pitch call in an antibiotic if she feels necessary   [facility] -[phone] just while she is in [place] [employer] number  [phone]

## 2017-10-04 NOTE — Telephone Encounter (Signed)
Patient called about the tick bite, she says she went to an UC in [place] and it is taken care of.

## 2017-10-09 ENCOUNTER — Other Ambulatory Visit: Payer: Self-pay

## 2017-10-09 MED ORDER — GABAPENTIN 300 MG PO CAPS: ORAL_CAPSULE | ORAL | 0 refills | Status: DC

## 2017-10-09 NOTE — Telephone Encounter (Signed)
Chart review. Lov 07/11/2017. PCP:Dr.Kuneff; App made.Refill request for gabapentin 300mg . ERx sent for 90 days/0 refill.[name]

## 2017-10-25 ENCOUNTER — Encounter: Payer: Self-pay | Admitting: Family Medicine

## 2017-10-25 ENCOUNTER — Ambulatory Visit (INDEPENDENT_AMBULATORY_CARE_PROVIDER_SITE_OTHER): Payer: Medicare HMO | Admitting: Family Medicine

## 2017-10-25 ENCOUNTER — Other Ambulatory Visit: Payer: Self-pay | Admitting: *Deleted

## 2017-10-25 VITALS — BP 111/77 | HR 100 | Temp 98.1°F | Resp 20 | Ht 63.0 in | Wt 191.0 lb

## 2017-10-25 DIAGNOSIS — E538 Deficiency of other specified B group vitamins: Secondary | ICD-10-CM | POA: Diagnosis not present

## 2017-10-25 DIAGNOSIS — E1142 Type 2 diabetes mellitus with diabetic polyneuropathy: Secondary | ICD-10-CM

## 2017-10-25 DIAGNOSIS — G609 Hereditary and idiopathic neuropathy, unspecified: Secondary | ICD-10-CM | POA: Diagnosis not present

## 2017-10-25 DIAGNOSIS — I10 Essential (primary) hypertension: Secondary | ICD-10-CM

## 2017-10-25 DIAGNOSIS — R000 Tachycardia, unspecified: Secondary | ICD-10-CM

## 2017-10-25 DIAGNOSIS — D696 Thrombocytopenia, unspecified: Secondary | ICD-10-CM

## 2017-10-25 DIAGNOSIS — E1165 Type 2 diabetes mellitus with hyperglycemia: Secondary | ICD-10-CM | POA: Diagnosis not present

## 2017-10-25 DIAGNOSIS — E611 Iron deficiency: Secondary | ICD-10-CM | POA: Diagnosis not present

## 2017-10-25 DIAGNOSIS — G4700 Insomnia, unspecified: Secondary | ICD-10-CM | POA: Diagnosis not present

## 2017-10-25 DIAGNOSIS — E785 Hyperlipidemia, unspecified: Secondary | ICD-10-CM

## 2017-10-25 LAB — POCT GLYCOSYLATED HEMOGLOBIN (HGB A1C): HEMOGLOBIN A1C: 7.2

## 2017-10-25 MED ORDER — SITAGLIP PHOS-METFORMIN HCL ER 100-1000 MG PO TB24: 1.0000 | ORAL_TABLET | Freq: Every day | ORAL | 1 refills | Status: DC

## 2017-10-25 MED ORDER — HYDROCHLOROTHIAZIDE 12.5 MG PO TABS: 12.5000 mg | ORAL_TABLET | Freq: Every day | ORAL | 1 refills | Status: DC

## 2017-10-25 MED ORDER — METOPROLOL TARTRATE 25 MG PO TABS: 25.0000 mg | ORAL_TABLET | Freq: Two times a day (BID) | ORAL | 0 refills | Status: DC

## 2017-10-25 MED ORDER — GABAPENTIN 300 MG PO CAPS: 600.0000 mg | ORAL_CAPSULE | Freq: Every day | ORAL | 1 refills | Status: DC

## 2017-10-25 MED ORDER — ZOLPIDEM TARTRATE 5 MG PO TABS: 5.0000 mg | ORAL_TABLET | Freq: Every day | ORAL | 1 refills | Status: DC

## 2017-10-25 NOTE — Patient Instructions (Addendum)
Metoprolol is every 12 hours. Start this medicine and discontinue the other BP med all together New script for HCTZ (diuretic portion only) called in , take one a day.   Make an appt in 1 week to have nurse visit for BP recheck and lab appt for your fasting labs collected (samre day ok)  Please help [place] help you:  We are honored you have chosen [facility] for your Primary Care home. Below you will find basic instructions that you may need to access in the future. Please help [place] help you by reading the instructions, which cover many of the frequent questions we experience.   Prescription refills and request:  -In order to allow more efficient response time, please call your pharmacy for all refills. They will forward the request electronically to [place]. This allows for the quickest possible response. Request left on a nurse line can take longer to refill, since these are checked as time allows between office patients and other phone calls.  - refill request can take up to 3-5 working days to complete.  - If request is sent electronically and request is appropiate, it is usually completed in 1-2 business days.  - all patients will need to be seen routinely for all chronic medical conditions requiring prescription medications (see follow-up below). If you are overdue for follow up on your condition, you will be asked to make an appointment and we will call in enough medication to cover you until your appointment (up to 30 days).  - all controlled substances will require a face to face visit to request/refill.  - if you desire your prescriptions to go through a new pharmacy, and have an active script at original pharmacy, you will need to call your pharmacy and have scripts transferred to new pharmacy. This is completed between the pharmacy locations and not by your provider.    Results: If any images or labs were ordered, it can take up to 1 week to get results depending on the test ordered and the  lab/facility running and resulting the test. - Normal or stable results, which do not need further discussion, may be released to your mychart immediately with attached note to you. A call may not be generated for normal results. Please make certain to sign up for mychart. If you have questions on how to activate your mychart you can call the front office.  - If your results need further discussion, our office will attempt to contact you via phone, and if unable to reach you after 2 attempts, we will release your abnormal result to your mychart with instructions.  - All results will be automatically released in mychart after 1 week.  - Your provider will provide you with explanation and instruction on all relevant material in your results. Please keep in mind, results and labs may appear confusing or abnormal to the untrained eye, but it does not mean they are actually abnormal for you personally. If you have any questions about your results that are not covered, or you desire more detailed explanation than what was provided, you should make an appointment with your provider to do so.   Our office handles many outgoing and incoming calls daily. If we have not contacted you within 1 week about your results, please check your mychart to see if there is a message first and if not, then contact our office.  In helping with this matter, you help decrease call volume, and therefore allow [place] to be  able to respond to patients needs more efficiently.   Acute office visits (sick visit):  An acute visit is intended for a new problem and are scheduled in shorter time slots to allow schedule openings for patients with new problems. This is the appropriate visit to discuss a new problem. Problems will not be addressed by phone call or Echart message. Appointment is needed if requesting treatment. In order to provide you with excellent quality medical care with proper time for you to explain your problem, have an exam and  receive treatment with instructions, these appointments should be limited to one new problem per visit. If you experience a new problem, in which you desire to be addressed, please make an acute office visit, we save openings on the schedule to accommodate you. Please do not save your new problem for any other type of visit, let us take care of it properly and quickly for you.   Follow up visits:  Depending on your condition(s) your provider will need to see you routinely in order to provide you with quality care and prescribe medication(s). Most chronic conditions (Example: hypertension, Diabetes, depression/anxiety... etc), require visits a couple times a year. Your provider will instruct you on proper follow up for your personal medical conditions and history. Please make certain to make follow up appointments for your condition as instructed. Failing to do so could result in lapse in your medication treatment/refills. If you request a refill, and are overdue to be seen on a condition, we will always provide you with a 30 day script (once) to allow you time to schedule.    Medicare wellness (well visit): - we have a wonderful Nurse Maudie Mercury), that will meet with you and provide you will yearly medicare wellness visits. These visits should occur yearly (can not be scheduled less than 1 calendar year apart) and cover preventive health, immunizations, advance directives and screenings you are entitled to yearly through your medicare benefits. Do not miss out on your entitled benefits, this is when medicare will pay for these benefits to be ordered for you.  These are strongly encouraged by your provider and is the appropriate type of visit to make certain you are up to date with all preventive health benefits. If you have not had your medicare wellness exam in the last 12 months, please make certain to schedule one by calling the office and schedule your medicare wellness with Maudie Mercury as soon as possible.   Yearly  physical (well visit):  - Adults are recommended to be seen yearly for physicals. Check with your insurance and date of your last physical, most insurances require one calendar year between physicals. Physicals include all preventive health topics, screenings, medical exam and labs that are appropriate for gender/age and history. You may have fasting labs needed at this visit. This is a well visit (not a sick visit), new problems should not be covered during this visit (see acute visit).  - Pediatric patients are seen more frequently when they are younger. Your provider will advise you on well child visit timing that is appropriate for your their age. - This is not a medicare wellness visit. Medicare wellness exams do not have an exam portion to the visit. Some medicare companies allow for a physical, some do not allow a yearly physical. If your medicare allows a yearly physical you can schedule the medicare wellness with our nurse Maudie Mercury and have your physical with your provider after, on the same day. Please check with insurance  for your full benefits.   Late Policy/No Shows:  - all new patients should arrive 15-30 minutes earlier than appointment to allow [place] time  to  obtain all personal demographics,  insurance information and for you to complete office paperwork. - All established patients should arrive 10-15 minutes earlier than appointment time to update all information and be checked in .  - In our best efforts to run on time, if you are late for your appointment you will be asked to either reschedule or if able, we will work you back into the schedule. There will be a wait time to work you back in the schedule,  depending on availability.  - If you are unable to make it to your appointment as scheduled, please call 24 hours ahead of time to allow [place] to fill the time slot with someone else who needs to be seen. If you do not cancel your appointment ahead of time, you may be charged a no show fee.

## 2017-10-25 NOTE — Progress Notes (Signed)
Patient ID: Kathleen Gibson, female  DOB: [birth year], 67 y.o.   MRN: [id number] Patient Care Team    Relationship Specialty Notifications Start End  [name], DO PCP - General Family Medicine  04/06/17   Doran Stabler, MD Consulting Physician Gastroenterology  04/06/17   Truitt Merle, MD Consulting Physician Hematology  04/06/17     Chief Complaint  Patient presents with  . Diabetes  . Hypertension    Subjective:  Kathleen Gibson is a 67 y.o.  female present for Diabetes: Pt reports compliance with janumet.  Blood sugars have been in the normal range.Patient denies dizziness, hyperglycemic or hypoglycemic events. Patient denies numbness, tingling in the extremities or nonhealing wounds of feet.  PNA series: PNA23 06/2017, repeat prevnar 06/2018 Flu shot: Patient declined (recommneded yearly) BMP: ordered today Foot exam: 06/15/2017 Eye exam: Patient reports her last eye exam was October 2017. She did receive a notice to reschedule her exam this year. She forgets her eye doctor's name. A1c: 8..6--> 8.3--> 7.9--> 7.2 today  Hypertension/hypertriglyceridemia: Pt reports  lisinopril-HCTZ 20-25 milligrams daily QOD, she did not take her medicine today. Patient denies chest pain, shortness of breath, dizziness or lower extremity edema.  Pt does not take a daily baby ASA. Pt is not prescribed statin. BMP: 09/01/2016 within normal limits with the exception of elevated glucose, ordered today CBC: May 25 2016 with mild anemia hemoglobin 11.4, platelets 103 (chronic thrombocytopenia)--> ordered today Lipids: December 2017 elevated triglycerides 291, low HDL 30, LDL and cholesterol normal.--> ordered today Diet: Does not watch routinely Exercise: Does not exercise routinely RF: Hypertension, diabetes, hyperlipidemia, obesity  Depression screen [facility] 2/9 04/06/2017 08/27/2014  Decreased Interest 0 0  Down, Depressed, Hopeless 0 0  PHQ - 2 Score 0 0   No flowsheet data found.       Fall Risk  04/06/2017  Falls in the past year? No     Immunization History  Administered Date(s) Administered  . Pneumococcal Conjugate-13 03/19/2015  . Pneumococcal Polysaccharide-23 06/03/2008, 07/11/2017  . Td 06/13/2006    No exam data present  Past Medical History:  Diagnosis Date  . Diabetes ([place])   . HLD (hyperlipidemia)   . Hypertension   . Pernicious anemia    No Known Allergies Past Surgical History:  Procedure Laterality Date  . ABDOMINAL HERNIA REPAIR  2009  . ABDOMINOPLASTY    . [place]  . GASTRIC BYPASS  2000  . hemorroid surgery  2007  . TONSILLECTOMY AND ADENOIDECTOMY  1957   Family History  Problem Relation Age of Onset  . Cancer Mother 46       unknown type cancer   . Hypertension Mother   . Hypertension Father   . Diabetes Sister   . Leukemia Brother   . Colon cancer Brother 62   Social History   Socioeconomic History  . Marital status: Divorced    Spouse name: Not on file  . Number of children: 3  . Years of education: 13  . Highest education level: Not on file  Occupational History  . Occupation: Retired  Scientific laboratory technician  . Financial resource strain: Not on file  . Food insecurity:    Worry: Not on file    Inability: Not on file  . Transportation needs:    Medical: Not on file    Non-medical: Not on file  Tobacco Use  . Smoking status: Never Smoker  . Smokeless tobacco: Never Used  Substance and Sexual Activity  . Alcohol use: No  . Drug use: No  . Sexual activity: Never  Lifestyle  . Physical activity:    Days per week: Not on file    Minutes per session: Not on file  . Stress: Not on file  Relationships  . Social connections:    Talks on phone: Not on file    Gets together: Not on file    Attends religious service: Not on file    Active member of club or organization: Not on file    Attends meetings of clubs or organizations: Not on file    Relationship status: Not on file  . Intimate partner violence:     Fear of current or ex partner: Not on file    Emotionally abused: Not on file    Physically abused: Not on file    Forced sexual activity: Not on file  Other Topics Concern  . Not on file  Social History Narrative   Divorced. Retired Pension scheme manager.   12th grade education.   Drinks caffeine.   Smoke alarm in the home. Wears her seatbelt.   Feels safe in her relationships.   Allergies as of 10/25/2017   No Known Allergies     Medication List        Accurate as of 10/25/17 10:22 AM. Always use your most recent med list.          ferrous sulfate 325 (65 FE) MG tablet Take 1 tablet (325 mg total) by mouth 2 (two) times daily with a meal.   Fish Oil 1000 MG Caps Take 1 capsule by mouth daily.   gabapentin 300 MG capsule Commonly known as:  NEURONTIN Take 2-3 capsules (600-900 mg total) by mouth at bedtime.   glucose monitoring kit monitoring kit 1 each by Does not apply route as needed for other. Use as directed   hydrochlorothiazide 12.5 MG tablet Commonly known as:  HYDRODIURIL Take 1 tablet (12.5 mg total) by mouth daily.   metoprolol tartrate 25 MG tablet Commonly known as:  LOPRESSOR Take 1 tablet (25 mg total) by mouth 2 (two) times daily.   SitaGLIPtin-MetFORMIN HCl [phone] MG Tb24 Take 1 tablet by mouth daily.   zolpidem 5 MG tablet Commonly known as:  AMBIEN Take 1 tablet (5 mg total) by mouth at bedtime.       All past medical history, surgical history, allergies, family history, immunizations andmedications were updated in the EMR today and reviewed under the history and medication portions of their EMR.    Recent Results (from the past 2160 hour(s))  POCT glycosylated hemoglobin (Hb A1C)     Status: Abnormal   Collection Time: 10/25/17  9:36 AM  Result Value Ref Range   Hemoglobin A1C 7.2      ROS: 14 pt review of systems performed and negative (unless mentioned in an HPI)  Objective: BP 111/77 (BP Location: Left Arm, Patient  Position: Sitting, Cuff Size: Normal)   Pulse 100   Temp 98.1 F (36.7 C)   Resp 20   Ht '5\' 3"'$  (1.6 m)   Wt 191 lb (86.6 kg)   SpO2 98%   BMI 33.83 kg/m  Gen: Afebrile. No acute distress. Nontoxic, very pleasant caucasian female.  HENT: AT. [place].  MMM.  Eyes:Pupils Equal Round Reactive to light, Extraocular movements intact,  Conjunctiva without redness, discharge or icterus. Neck/lymp/endocrine: Supple,no lymphadenopathy, no thyromegaly CV: RRR no murmur, no edema, +2/4 P posterior tibialis pulses Chest: CTAB,  no wheeze or crackles Abd: Soft. NTND. BS present. no Masses palpated.  Neuro: Normal gait. PERLA. EOMi. Alert. Oriented x3  Psych: Normal affect, dress and demeanor. Normal speech. Normal thought content and judgment.    Assessment/plan: Kathleen Gibson is a 67 y.o. female present for establishment of care (from [facility]) and chronic medical conditions. Essential hypertension/hyperlipidemia/tachycardia - pt taking medication QOD. Making management confusing. Her BP today without medication > 24 hours ago is great. She does endorse swelling in her ankles if she goes more than 1 day without the HCTZ 25 mg. And continues to have tachycardia (many years standing) - Change in therapy today--> Hctz 12.5 mg QD, metoprolol tartate 25 mg BID (tachycardia) - Low-sodium diet, exercise. 150 minutes a week - Patient was encouraged to start a baby aspirin if able to tolerate, and start 1000 mg of fish oil daily. - F/U nurse visit 1 week, with fasting labs collected also since not fasting today.   Diabetic polyneuropathy associated with type 2 diabetes mellitus ([place]) Uncontrolled type 2 diabetes mellitus with hyperglycemia ([place]) - gaining better control. 8.6--> 8.3--> 7.9--> 7.2 today doing well.  - continue to decrease carb and sugar. Increase exercise.  - continue Janumet [phone] mg QD. Refills provided today - Continue gabapentin (doses below)  INSOMNIA, CHRONIC - chronic comtroled on  ambien. Decreased dose to 5 mg for safety profile age/female.  - gabapentin 300-900 qhs, usually 600, she can increase to 900 if lower dose of ambien does not help insomnia.   Thrombocytopenia ([place])  Followed by hematology.--> feels she is bruising more frequently - CBC is ordered  ANEMIA, PERNICIOUS/B 12 deficiency/history gastric bypass/fatigue/cold fingertips-cold intolerance - Prior iron levels low, now taking iron supplement once a day. She is unable to tolerate more. She does not feel a difference in the symptoms after supplement started - Repeat levels  Ordered, If not improved she is established with hematology for thrombocytopenia, could consider iron transfusions.    Return in about 3 months (around 01/25/2018).   Note is dictated utilizing voice recognition software. Although note has been proof read prior to signing, occasional typographical errors still can be missed. If any questions arise, please do not hesitate to call for verification.  Electronically signed by: Howard Pouch, DO [place]

## 2017-11-07 ENCOUNTER — Other Ambulatory Visit (INDEPENDENT_AMBULATORY_CARE_PROVIDER_SITE_OTHER): Payer: Medicare HMO

## 2017-11-07 ENCOUNTER — Ambulatory Visit: Payer: Medicare HMO

## 2017-11-07 ENCOUNTER — Telehealth: Payer: Self-pay | Admitting: Family Medicine

## 2017-11-07 DIAGNOSIS — D696 Thrombocytopenia, unspecified: Secondary | ICD-10-CM | POA: Diagnosis not present

## 2017-11-07 DIAGNOSIS — D72821 Monocytosis (symptomatic): Secondary | ICD-10-CM

## 2017-11-07 DIAGNOSIS — E785 Hyperlipidemia, unspecified: Secondary | ICD-10-CM

## 2017-11-07 DIAGNOSIS — E1165 Type 2 diabetes mellitus with hyperglycemia: Secondary | ICD-10-CM | POA: Diagnosis not present

## 2017-11-07 DIAGNOSIS — E611 Iron deficiency: Secondary | ICD-10-CM | POA: Diagnosis not present

## 2017-11-07 LAB — LIPID PANEL
Cholesterol: 134 mg/dL (ref 0–200)
HDL: 29.4 mg/dL — AB (ref >39.00)
LDL Cholesterol: 70 mg/dL (ref 0–99)
NONHDL: 104.34
Total CHOL/HDL Ratio: 5
Triglycerides: 171 mg/dL — ABNORMAL HIGH (ref 0.0–149.0)
VLDL: 34.2 mg/dL (ref 0.0–40.0)

## 2017-11-07 LAB — CBC WITH DIFFERENTIAL/PLATELET
BASOS ABS: 0.1 10*3/uL (ref 0.0–0.1)
Basophils Relative: 1 % (ref 0.0–3.0)
Eosinophils Absolute: 0 10*3/uL (ref 0.0–0.7)
Eosinophils Relative: 0.5 % (ref 0.0–5.0)
HCT: 35.9 % — ABNORMAL LOW (ref 36.0–46.0)
Hemoglobin: 12.2 g/dL (ref 12.0–15.0)
Lymphocytes Relative: 23.5 % (ref 12.0–46.0)
Lymphs Abs: 2.1 10*3/uL (ref 0.7–4.0)
MCHC: 34 g/dL (ref 30.0–36.0)
MCV: 90.3 fl (ref 78.0–100.0)
Monocytes Absolute: 1.8 10*3/uL — ABNORMAL HIGH (ref 0.1–1.0)
NEUTROS ABS: 4.9 10*3/uL (ref 1.4–7.7)
Neutrophils Relative %: 55 % (ref 43.0–77.0)
RBC: 3.97 Mil/uL (ref 3.87–5.11)
RDW: 13.5 % (ref 11.5–15.5)
WBC: 8.9 10*3/uL (ref 4.0–10.5)

## 2017-11-07 LAB — BASIC METABOLIC PANEL
BUN: 20 mg/dL (ref 6–23)
CALCIUM: 9.2 mg/dL (ref 8.4–10.5)
CO2: 30 meq/L (ref 19–32)
CREATININE: 0.98 mg/dL (ref 0.40–1.20)
Chloride: 104 mEq/L (ref 96–112)
GFR: 60.31 mL/min (ref >60.00)
GLUCOSE: 127 mg/dL — AB (ref 70–99)
Potassium: 4.1 mEq/L (ref 3.5–5.1)
SODIUM: 141 meq/L (ref 135–145)

## 2017-11-07 NOTE — Telephone Encounter (Signed)
Received report from Santiago Glad at [place] lab: platelets 32,000   Called report to PCP office to [facility]

## 2017-11-07 NOTE — Telephone Encounter (Addendum)
Attempted to call pt to discuss her thrombocytopenia. She has had chronic thrombocytopenia and followed with hematology (Dr. Burr Medico) until 2016.   Please inform patient the following information: - her platelet count is at 34, which appears to be the lowest it has ever been in the past by EMR review. Her monocytes are increased (more than prior).  I have referred her back to her hematologist to discuss further evaluation. Caution with any injuries/bleeding. Avoid any elective procedures/dental work etc. Avoid NSAIDS, ASA etc.    As far as the rest of her labs: Her cholesterol looks great with the exception of mildly elevated triglycerides and mildly low HDL, which is the good cholesterol. Increase fish oil to 2000-3000mg  a day. Her iron panel is in normal range now.

## 2017-11-07 NOTE — Telephone Encounter (Signed)
Call received from [facility] triage nurse Rich Reining RN. Critical lab report Plt count 32,000. Read back and Affirmed. Lab drawn and resulted today.

## 2017-11-08 ENCOUNTER — Telehealth: Payer: Self-pay | Admitting: Hematology

## 2017-11-08 LAB — IRON,TIBC AND FERRITIN PANEL
%SAT: 21 % (ref 11–50)
FERRITIN: 36 ng/mL (ref 20–288)
IRON: 73 ug/dL (ref 45–160)
TIBC: 344 mcg/dL (calc) (ref 250–450)

## 2017-11-08 NOTE — Telephone Encounter (Signed)
Scheduled appt per 5/29 sch msg - left vm for pt re appts.  °

## 2017-11-08 NOTE — Telephone Encounter (Signed)
Spoke with patient reviewed lab results and instructions. Patient verbalized understanding. 

## 2017-11-09 ENCOUNTER — Inpatient Hospital Stay: Payer: Medicare HMO | Admitting: Hematology

## 2017-11-09 ENCOUNTER — Other Ambulatory Visit: Payer: Self-pay | Admitting: Hematology

## 2017-11-09 ENCOUNTER — Telehealth: Payer: Self-pay | Admitting: Hematology

## 2017-11-09 ENCOUNTER — Ambulatory Visit: Payer: Medicare HMO

## 2017-11-09 DIAGNOSIS — D696 Thrombocytopenia, unspecified: Secondary | ICD-10-CM

## 2017-11-09 NOTE — Telephone Encounter (Signed)
Patient called to cancel appointment. Will call to r/s

## 2017-11-16 ENCOUNTER — Ambulatory Visit (INDEPENDENT_AMBULATORY_CARE_PROVIDER_SITE_OTHER): Payer: Medicare HMO | Admitting: *Deleted

## 2017-11-16 VITALS — BP 112/70 | HR 76 | Temp 98.2°F | Resp 16

## 2017-11-16 DIAGNOSIS — I10 Essential (primary) hypertension: Secondary | ICD-10-CM | POA: Diagnosis not present

## 2017-11-16 MED ORDER — METOPROLOL SUCCINATE ER 25 MG PO TB24: 25.0000 mg | ORAL_TABLET | Freq: Every day | ORAL | 1 refills | Status: DC

## 2017-11-16 NOTE — Addendum Note (Signed)
Addended by: Howard Pouch A on: 11/16/2017 12:10 PM   Modules accepted: Orders

## 2017-11-16 NOTE — Progress Notes (Signed)
[place]  [phone] [phone]) [phone]  Clinic Follow Up Note   Patient Care Team: [name], DO as PCP - General (Family Medicine) Loletha Carrow Kirke Corin, MD as Consulting Physician (Gastroenterology) Truitt Merle, MD as Consulting Physician (Hematology)   Date of Service:  11/20/2017  Referring physician:Kuneff, Renee A, DO  CHIEF COMPLAINTS:  Thrombocytopenia   HISTORY OF PRESENTING ILLNESS:  Kathleen Gibson 67 y.o. female with past medical history of diabetes, hypertension, gastric bypass surgery and pernicious anemia, is here because of thrombocytopenia.  She had a normal CBC in 2009. According to our Epic records, She has had mild thrombocytopenia since 2014, with platelet in the range of 120-140K. she has a routine lab work we'll months ago, which showed platelet 95K, and slight anemia with hemoglobin 11.8. White count was normal. She was referred for further workup.   She feels well overall, has been mild fatigue,  She denied any bleeding episodes including hematochezia, melana, hemoptysis, hematuria or epitaxis. No mucosal bleeding. Occasionally she has mild bruises on her arms.   She denies recent chest pain on exertion, shortness of breath on minimal exertion, pre-syncopal episodes, or palpitations. Her never had colonoscopy   She had no prior history or diagnosis of cancer. Her last mammogram was 2-3 years ago  She denies any pica and eats a variety of diet. She never donated blood, she received  blood transfusion after her gastric bypass surgery  She does not drink alcohol, not taking other over-the-counter medication or herbal supplement. Denies history of liver disease or chronic infection such as hepatitis or HIV.    INTERVAL HISTORY  Kathleen Gibson is here for a follow up for her chronic ITP. She was last seen by me in 04/2015 but lost follow up. She was referred back to me by her PCP, Dr. Allayne Stack, due to her sever thrombocytopenia, plt  dropped to 34K on 11/07/17.   She presents to the clinic today noting she has changed her PCP to Dr. Allayne Stack. She has not been getting her platelets checked in a long time. She notes she bruises easily a lot but denies bleeding of guns, nose urine or stool. The bruising occurs all the time.She notes she had gastric bypass several years ago and her new PCP notes she no longer needs B12 injections so she stopped.   She notes she has a graduation in [place] this month and plans to go.   On review of symptoms, pt notes bruising easily but denies any bleeding. She notes RLS.     MEDICAL HISTORY:  Past Medical History:  Diagnosis Date  . Diabetes ([place])   . HLD (hyperlipidemia)   . Hypertension   . Pernicious anemia     SURGICAL HISTORY: Past Surgical History:  Procedure Laterality Date  . ABDOMINAL HERNIA REPAIR  2009  . ABDOMINOPLASTY    . [place]  . GASTRIC BYPASS  2000  . hemorroid surgery  2007  . TONSILLECTOMY AND ADENOIDECTOMY  1957    SOCIAL HISTORY: Social History   Socioeconomic History  . Marital status: Divorced    Spouse name: Not on file  . Number of children: 3  . Years of education: 29  . Highest education level: Not on file  Occupational History  . Occupation: Retired  Scientific laboratory technician  . Financial resource strain: Not on file  . Food insecurity:    Worry: Not on file    Inability: Not on file  .  Transportation needs:    Medical: Not on file    Non-medical: Not on file  Tobacco Use  . Smoking status: Never Smoker  . Smokeless tobacco: Never Used  Substance and Sexual Activity  . Alcohol use: No  . Drug use: No  . Sexual activity: Never  Lifestyle  . Physical activity:    Days per week: Not on file    Minutes per session: Not on file  . Stress: Not on file  Relationships  . Social connections:    Talks on phone: Not on file    Gets together: Not on file    Attends religious service: Not on file    Active member of club or  organization: Not on file    Attends meetings of clubs or organizations: Not on file    Relationship status: Not on file  . Intimate partner violence:    Fear of current or ex partner: Not on file    Emotionally abused: Not on file    Physically abused: Not on file    Forced sexual activity: Not on file  Other Topics Concern  . Not on file  Social History Narrative   Divorced. Retired Pension scheme manager.   12th grade education.   Drinks caffeine.   Smoke alarm in the home. Wears her seatbelt.   Feels safe in her relationships.    FAMILY HISTORY: Family History  Problem Relation Age of Onset  . Cancer Mother 4       unknown type cancer   . Hypertension Mother   . Hypertension Father   . Diabetes Sister   . Leukemia Brother   . Colon cancer Brother 18    ALLERGIES:  has No Known Allergies.  MEDICATIONS:  Current Outpatient Medications  Medication Sig Dispense Refill  . ferrous sulfate 325 (65 FE) MG tablet Take 1 tablet (325 mg total) by mouth 2 (two) times daily with a meal. 180 tablet 0  . gabapentin (NEURONTIN) 300 MG capsule Take 2-3 capsules (600-900 mg total) by mouth at bedtime. 270 capsule 1  . glucose monitoring kit (FREESTYLE) monitoring kit 1 each by Does not apply route as needed for other. Use as directed 1 each 0  . hydrochlorothiazide (HYDRODIURIL) 12.5 MG tablet Take 1 tablet (12.5 mg total) by mouth daily. 90 tablet 1  . metoprolol succinate (TOPROL-XL) 25 MG 24 hr tablet Take 1 tablet (25 mg total) by mouth daily. 90 tablet 1  . Omega-3 Fatty Acids (FISH OIL) 1000 MG CAPS Take 1 capsule by mouth daily.    . SitaGLIPtin-MetFORMIN HCl [phone] MG TB24 Take 1 tablet by mouth daily. 90 tablet 1  . zolpidem (AMBIEN) 5 MG tablet Take 1 tablet (5 mg total) by mouth at bedtime. 90 tablet 1   No current facility-administered medications for this visit.     REVIEW OF SYSTEMS:   Constitutional: Denies fevers, chills or abnormal night sweats Eyes: Denies  blurriness of vision, double vision or watery eyes Ears, nose, mouth, throat, and face: Denies mucositis or sore throat Respiratory: Denies cough, dyspnea or wheezes Cardiovascular: Denies palpitation, chest discomfort or lower extremity swelling Gastrointestinal:  Denies nausea, heartburn or change in bowel habits Skin: Denies abnormal skin rashes (+) bruises easily Lymphatics: Denies new lymphadenopathy or easy bruising Neurological:Denies numbness, tingling or new weaknesses (+) Restless leg syndrome  Behavioral/Psych: Mood is stable, no new changes  All other systems were reviewed with the patient and are negative.  PHYSICAL EXAMINATION: ECOG PERFORMANCE STATUS: 0 -  Asymptomatic  Vitals:   11/20/17 0837  BP: (!) 143/86  Pulse: 88  Resp: 18  Temp: 98.7 F (37.1 C)  SpO2: 98%   Filed Weights   11/20/17 0837  Weight: 194 lb 4.8 oz (88.1 kg)    GENERAL:alert, no distress and comfortable SKIN: skin color, texture, turgor are normal, no rashes or significant lesions, no petechia or ecchymosis. EYES: normal, conjunctiva are pink and non-injected, sclera clear OROPHARYNX:no exudate, no erythema and lips, buccal mucosa, and tongue normal  NECK: supple, thyroid normal size, non-tender, without nodularity LYMPH:  no palpable lymphadenopathy in the cervical, axillary or inguinal LUNGS: clear to auscultation and percussion with normal breathing effort HEART: regular rate & rhythm and no murmurs and no lower extremity edema ABDOMEN:abdomen soft, non-tender and normal bowel sounds Musculoskeletal:no cyanosis of digits and no clubbing  PSYCH: alert & oriented x 3 with fluent speech NEURO: no focal motor/sensory deficits  LABORATORY DATA:  I have reviewed the data as listed CBC Latest Ref Rng & Units 11/20/2017 11/07/2017 07/23/2015  WBC 3.9 - 10.3 K/uL 10.4(H) 8.9 11.3(H)  Hemoglobin 11.6 - 15.9 g/dL 11.4(L) 12.2 11.4(L)  Hematocrit 34.8 - 46.6 % 35.2 35.9(L) 34.9  Platelets 145 - 400  K/uL 44(L) 34.0 Repeated and verified X2.(LL) 103(L)    CMP Latest Ref Rng & Units 11/20/2017 11/07/2017 09/01/2016  Glucose 70 - 140 mg/dL 183(H) 127(H) 252(H)  BUN 7 - 26 mg/dL '18 20 18  '$ Creatinine 0.60 - 1.10 mg/dL 1.03 0.98 0.89  Sodium 136 - 145 mmol/L 140 141 136  Potassium 3.5 - 5.1 mmol/L 4.2 4.1 4.5  Chloride 98 - 109 mmol/L 104 104 101  CO2 22 - 29 mmol/L '28 30 27  '$ Calcium 8.4 - 10.4 mg/dL 9.3 9.2 9.4  Total Protein 6.4 - 8.3 g/dL 7.7 - 7.4  Total Bilirubin 0.2 - 1.2 mg/dL 0.4 - 0.5  Alkaline Phos 40 - 150 U/L 76 - 56  AST 5 - 34 U/L 12 - 12  ALT 0 - 55 U/L 11 - 12    RADIOGRAPHIC STUDIES: I have personally reviewed the radiological images as listed and agreed with the findings in the report. No results found.  ASSESSMENT & PLAN:  67 year old Caucasian female, with past medical history of gastric bypass, diabetes, hypertension, presented with mild thrombocytopenia for 2 years, and mild anemia. History of pernicious anemia after gastric surgery.  1.  Thrombocytopenia secondary to splenomegaly and/or chronic ITP -I previously discussed the common etiology for chronic mild thrombocytopenia, which include autoimmune related (ITP), liver disease, splenomegaly, medication or alcohol induced, chronic infection such as hepatitis C and HIV, and bone marrow disease such as MDS. The other etiology such as infection, malignancy, microangiopathy, DIC a less likely given the indolent course and her clinical presentation. -she does not drink alcohol and her previous HIV, Hep C was negative  -she was is on monthly B12 injection due to the history of pernicious anemia and gastric bypass, but given her levels were normal her PCP stopped her injections.  -Her workup CBC showed borderline leukocytosis, normal ANA, lupus anticoagulant, and folic acid level.  -05/2015 US abdomen showed fatty liver, gallstones and splenomegaly at 15.7cm.  -We previously discussed the risk of bleeding from  thrombocytopenia. Given the mild degree of thrombocytopenia, the risk of bleeding is not very high. But she knows to avoid injury.  -Her chronic thrombocytopenia is likely secondary to spleen megaly and or chronic ITP.  -She lost follow-up for 2 years, and her  thrombocytopenia has gotten much worse, platelet count 34K for a couple weeks ago, will repeat it today.  She has no clinical significant bleeding, except for easy bruising. -Due to her significant worsening thrombocytopenia, I recommend her to consider a bone marrow biopsy in the next few months to rule out MDS or other primary bone marrow disease, given her age. -If her platelet count remains to be above 30 K, I will monitor clinically -We discussed treatment option for ITP, including steroids, such as dexamethasone plus, splenectomy, and growth factor such as Nplate or Promacta.  She has diabetes and obesity, is reluctant to take steroids. -will see her back in 3 months, with lab monthly at her PCP office     2. Mild Normocytic anemia  -She had history of pernicious anemia, previously on B12 injection -Initial 2016 iron study, ferritin, retic ct WNL, overall -On '325mg'$  ferrous sulfate once daily, continue.   3. DM, HTN -she'll continue follow-up with her primary care physician  PLAN -Labs today, will contact patient with results  -Lab and f/u in 3 months, with CBC monthly at her PCP office if her plt>30K -will obtain a bone marrow biopsy before her return visit    All questions were answered. The patient knows to call the clinic with any problems, questions or concerns. I spent 25 minutes counseling the patient face to face. The total time spent in the appointment was 30 minutes and more than 50% was on counseling.     Truitt Merle, MD 11/20/2017  10:01 AM   I, Joslyn Devon, am acting as scribe for Truitt Merle, MD.   I have reviewed the above documentation for accuracy and completeness, and I agree with the above.

## 2017-11-16 NOTE — Progress Notes (Signed)
Spoke with patient reviewed information and instructions patient verbalized understanding. 

## 2017-11-16 NOTE — Progress Notes (Addendum)
Kathleen Gibson is a 67 y.o. female presents to the office today for Blood pressure recheck secondary to elevated BP in office, medications regiment changed at last office visit.  Blood pressure medication: metoprolol tartrate 25mg  taking 1 tab BID, hctz 12.5mg  taking 1 tab daily If on medication, Last dose was at least 1-2 hours prior to recheck: Yes, per pt last dose of medications was taking at 8:00am this morning. Blood pressure was taken in the left arm after patient rested for 5 minutes.  BP 112/70 (BP Location: Left Arm, Patient Position: Sitting, Cuff Size: Normal)   Pulse 76   Temp 98.2 F (36.8 C) (Oral)   Resp 16   SpO2 (!) 76%    Kathleen Gibson    Please inform patient the following information: Her BP is perfect. Continue current medications. Refills on metoprolol provided. Since she tolerated metoprolol I have changed it to the XL, so when she picks up the new bottle of metoprolol she will only take ONCE daily.    Also, please remind her to reschedule her appt with hematology concerning her appt.  F/U for DM and HTN every 3-4 months.   Medical screening examination/treatment/procedure(s) were performed by non-physician practitioner and as supervising physician I was immediately available for consultation/collaboration.  I agree with above assessment and plan.  Electronically Signed by: Howard Pouch, DO [place] primary [place]

## 2017-11-20 ENCOUNTER — Inpatient Hospital Stay: Payer: Medicare HMO | Attending: Hematology | Admitting: Hematology

## 2017-11-20 ENCOUNTER — Telehealth: Payer: Self-pay | Admitting: Hematology

## 2017-11-20 ENCOUNTER — Encounter: Payer: Self-pay | Admitting: Family Medicine

## 2017-11-20 ENCOUNTER — Encounter: Payer: Self-pay | Admitting: Hematology

## 2017-11-20 ENCOUNTER — Inpatient Hospital Stay: Payer: Medicare HMO

## 2017-11-20 VITALS — BP 143/86 | HR 88 | Temp 98.7°F | Resp 18 | Ht 63.0 in | Wt 194.3 lb

## 2017-11-20 DIAGNOSIS — D510 Vitamin B12 deficiency anemia due to intrinsic factor deficiency: Secondary | ICD-10-CM

## 2017-11-20 DIAGNOSIS — Z9884 Bariatric surgery status: Secondary | ICD-10-CM

## 2017-11-20 DIAGNOSIS — D693 Immune thrombocytopenic purpura: Secondary | ICD-10-CM

## 2017-11-20 DIAGNOSIS — R5383 Other fatigue: Secondary | ICD-10-CM

## 2017-11-20 DIAGNOSIS — D649 Anemia, unspecified: Secondary | ICD-10-CM | POA: Diagnosis not present

## 2017-11-20 DIAGNOSIS — D696 Thrombocytopenia, unspecified: Secondary | ICD-10-CM

## 2017-11-20 DIAGNOSIS — E119 Type 2 diabetes mellitus without complications: Secondary | ICD-10-CM | POA: Diagnosis not present

## 2017-11-20 DIAGNOSIS — I10 Essential (primary) hypertension: Secondary | ICD-10-CM

## 2017-11-20 DIAGNOSIS — E669 Obesity, unspecified: Secondary | ICD-10-CM | POA: Diagnosis not present

## 2017-11-20 LAB — CMP (CANCER CENTER ONLY)
ALBUMIN: 4 g/dL (ref 3.5–5.0)
ALT: 11 U/L (ref 0–55)
ANION GAP: 8 (ref 3–11)
AST: 12 U/L (ref 5–34)
Alkaline Phosphatase: 76 U/L (ref 40–150)
BILIRUBIN TOTAL: 0.4 mg/dL (ref 0.2–1.2)
BUN: 18 mg/dL (ref 7–26)
CO2: 28 mmol/L (ref 22–29)
Calcium: 9.3 mg/dL (ref 8.4–10.4)
Chloride: 104 mmol/L (ref 98–109)
Creatinine: 1.03 mg/dL (ref 0.60–1.10)
GFR, Estimated: 55 mL/min — ABNORMAL LOW (ref >60)
GLUCOSE: 183 mg/dL — AB (ref 70–140)
POTASSIUM: 4.2 mmol/L (ref 3.5–5.1)
SODIUM: 140 mmol/L (ref 136–145)
TOTAL PROTEIN: 7.7 g/dL (ref 6.4–8.3)

## 2017-11-20 LAB — CBC WITH DIFFERENTIAL (CANCER CENTER ONLY)
Basophils Absolute: 0 10*3/uL (ref 0.0–0.1)
Basophils Relative: 0 %
EOS ABS: 0.1 10*3/uL (ref 0.0–0.5)
Eosinophils Relative: 1 %
HEMATOCRIT: 35.2 % (ref 34.8–46.6)
Hemoglobin: 11.4 g/dL — ABNORMAL LOW (ref 11.6–15.9)
Lymphocytes Relative: 30 %
Lymphs Abs: 3.1 10*3/uL (ref 0.9–3.3)
MCH: 29.6 pg (ref 25.1–34.0)
MCHC: 32.4 g/dL (ref 31.5–36.0)
MCV: 91.4 fL (ref 79.5–101.0)
MONO ABS: 2 10*3/uL — AB (ref 0.1–0.9)
MONOS PCT: 19 %
Neutro Abs: 5.2 10*3/uL (ref 1.5–6.5)
Neutrophils Relative %: 50 %
Platelet Count: 44 10*3/uL — ABNORMAL LOW (ref 145–400)
RBC: 3.85 MIL/uL (ref 3.70–5.45)
RDW: 13.1 % (ref 11.2–14.5)
WBC Count: 10.4 10*3/uL — ABNORMAL HIGH (ref 3.9–10.3)

## 2017-11-20 NOTE — Telephone Encounter (Signed)
Appointments scheduled AVS/Calendar printed per 6/10 los °

## 2017-11-21 ENCOUNTER — Encounter: Payer: Self-pay | Admitting: *Deleted

## 2017-11-21 ENCOUNTER — Telehealth: Payer: Self-pay

## 2017-11-21 ENCOUNTER — Telehealth: Payer: Self-pay | Admitting: Family Medicine

## 2017-11-21 LAB — PATHOLOGIST SMEAR REVIEW

## 2017-11-21 LAB — HEPATITIS B CORE ANTIBODY, TOTAL: HEP B C TOTAL AB: NEGATIVE

## 2017-11-21 LAB — HEPATITIS B SURFACE ANTIGEN: HEP B S AG: NEGATIVE

## 2017-11-21 NOTE — Telephone Encounter (Signed)
Notified patient per Dr. Burr Medico of lab results, informed needs CBC from PCP monthly.  She will continue to monitor her results for the next 3 months and will decide if needs a bone marrow biopsy, patient verbalized an understanding.

## 2017-11-21 NOTE — Telephone Encounter (Signed)
In reference to her e-message, there other options for her insomnia that re medications, but she will need appt to discuss other options.

## 2017-11-21 NOTE — Telephone Encounter (Signed)
-----  Message from Truitt Merle, MD sent at 11/20/2017 11:28 PM EDT ----- Please let pt know her lab results. I contacted her PCP for her monthly lab CBC and her PCP states she needs to be seen for the lab. I will watch her CBC in the next few months, then decide if she needs a bone marrow biopsy. I will see her back in 3 months (LOS sent yesterday)  Truitt Merle  11/20/2017

## 2018-01-15 ENCOUNTER — Other Ambulatory Visit: Payer: Self-pay | Admitting: *Deleted

## 2018-01-15 MED ORDER — FERROUS SULFATE 325 (65 FE) MG PO TABS: 325.0000 mg | ORAL_TABLET | Freq: Two times a day (BID) | ORAL | 0 refills | Status: DC

## 2018-01-24 ENCOUNTER — Ambulatory Visit: Payer: Medicare HMO | Admitting: Family Medicine

## 2018-01-28 NOTE — Progress Notes (Signed)
  Subjective:  Patient ID: Kathleen Gibson, female    DOB: 02-21-1951,  MRN: [id number]  Chief Complaint  Patient presents with  . Diabetes    New patient diabetic foot exam. last A1C   8.6   67 y.o. female returns for diabetic foot care. Last A1c was 8.6. Denies numbness and tingling in their feet. Denies cramping in legs and thighs.  Objective:   Vitals:   06/15/17 1029  BP: 95/76  Pulse: 93   General AA&O x3. Normal mood and affect.  Vascular Dorsalis pedis pulses present 1+ bilaterally  Posterior tibial pulses 1+ bilaterally  Capillary refill normal to all digits. Pedal hair growth normal.  Neurologic Epicritic sensation present bilaterally. Protective sensation with 5.07 monofilament  present bilaterally. Vibratory sensation present bilaterally.  Dermatologic No open lesions. Interspaces clear of maceration.  Normal skin temperature and turgor. Hyperkeratotic lesions: None bilaterally. Nails: brittle, onychomycosis, thickening, elongation  Orthopedic: No history of amputation. MMT 5/5 in dorsiflexion, plantarflexion, inversion, and eversion. Normal lower extremity joint ROM without pain or crepitus.    Assessment & Plan:  Patient was evaluated and treated and all questions answered.  Diabetes without complication, Onychomycosis -Educated on diabetic footcare. Diabetic risk level 0  Return in about 1 year (around 06/15/2018) for Diabetic Foot Care.

## 2018-02-20 ENCOUNTER — Telehealth: Payer: Self-pay | Admitting: Hematology

## 2018-02-20 NOTE — Telephone Encounter (Signed)
Patient called to cancel she will call back to reschedule

## 2018-02-21 ENCOUNTER — Other Ambulatory Visit: Payer: Medicare HMO

## 2018-02-21 ENCOUNTER — Ambulatory Visit: Payer: Medicare HMO | Admitting: Hematology

## 2018-04-11 ENCOUNTER — Encounter: Payer: Self-pay | Admitting: Family Medicine

## 2018-04-11 ENCOUNTER — Ambulatory Visit (INDEPENDENT_AMBULATORY_CARE_PROVIDER_SITE_OTHER): Payer: Medicare HMO | Admitting: Family Medicine

## 2018-04-11 VITALS — BP 118/77 | HR 80 | Temp 98.2°F | Resp 20 | Ht 63.0 in | Wt 194.0 lb

## 2018-04-11 DIAGNOSIS — E1140 Type 2 diabetes mellitus with diabetic neuropathy, unspecified: Secondary | ICD-10-CM

## 2018-04-11 DIAGNOSIS — I10 Essential (primary) hypertension: Secondary | ICD-10-CM

## 2018-04-11 DIAGNOSIS — E1165 Type 2 diabetes mellitus with hyperglycemia: Secondary | ICD-10-CM

## 2018-04-11 DIAGNOSIS — E785 Hyperlipidemia, unspecified: Secondary | ICD-10-CM

## 2018-04-11 DIAGNOSIS — D693 Immune thrombocytopenic purpura: Secondary | ICD-10-CM | POA: Diagnosis not present

## 2018-04-11 DIAGNOSIS — G4700 Insomnia, unspecified: Secondary | ICD-10-CM | POA: Diagnosis not present

## 2018-04-11 DIAGNOSIS — E1142 Type 2 diabetes mellitus with diabetic polyneuropathy: Secondary | ICD-10-CM | POA: Diagnosis not present

## 2018-04-11 LAB — TSH: TSH: 2.31 u[IU]/mL (ref 0.35–4.50)

## 2018-04-11 LAB — CBC WITH DIFFERENTIAL/PLATELET
BASOS ABS: 0 10*3/uL (ref 0.0–0.1)
Basophils Relative: 0.4 % (ref 0.0–3.0)
EOS PCT: 0.1 % (ref 0.0–5.0)
Eosinophils Absolute: 0 10*3/uL (ref 0.0–0.7)
HEMATOCRIT: 35.7 % — AB (ref 36.0–46.0)
HEMOGLOBIN: 12.3 g/dL (ref 12.0–15.0)
LYMPHS ABS: 2 10*3/uL (ref 0.7–4.0)
LYMPHS PCT: 22.6 % (ref 12.0–46.0)
MCHC: 34.5 g/dL (ref 30.0–36.0)
MCV: 89.9 fl (ref 78.0–100.0)
Monocytes Absolute: 2 10*3/uL — ABNORMAL HIGH (ref 0.1–1.0)
Neutro Abs: 4.9 10*3/uL (ref 1.4–7.7)
Neutrophils Relative %: 55.1 % (ref 43.0–77.0)
Platelets: 93 10*3/uL — ABNORMAL LOW (ref 150.0–400.0)
RBC: 3.97 Mil/uL (ref 3.87–5.11)
RDW: 13.5 % (ref 11.5–15.5)
WBC: 9 10*3/uL (ref 4.0–10.5)

## 2018-04-11 LAB — POCT GLYCOSYLATED HEMOGLOBIN (HGB A1C): HEMOGLOBIN A1C: 7.3 % — AB (ref 4.0–5.6)

## 2018-04-11 MED ORDER — ZOLPIDEM TARTRATE 5 MG PO TABS: 5.0000 mg | ORAL_TABLET | Freq: Every day | ORAL | 1 refills | Status: DC

## 2018-04-11 MED ORDER — SITAGLIP PHOS-METFORMIN HCL ER 100-1000 MG PO TB24: 1.0000 | ORAL_TABLET | Freq: Every day | ORAL | 1 refills | Status: DC

## 2018-04-11 MED ORDER — METOPROLOL SUCCINATE ER 25 MG PO TB24: 25.0000 mg | ORAL_TABLET | Freq: Every day | ORAL | 1 refills | Status: DC

## 2018-04-11 MED ORDER — TRAZODONE HCL 50 MG PO TABS: 50.0000 mg | ORAL_TABLET | Freq: Every evening | ORAL | 0 refills | Status: DC | PRN

## 2018-04-11 MED ORDER — HYDROCHLOROTHIAZIDE 12.5 MG PO TABS: 12.5000 mg | ORAL_TABLET | Freq: Every day | ORAL | 1 refills | Status: DC

## 2018-04-11 MED ORDER — GABAPENTIN 300 MG PO CAPS: 600.0000 mg | ORAL_CAPSULE | Freq: Every day | ORAL | 1 refills | Status: DC

## 2018-04-11 MED ORDER — GLYBURIDE 2.5 MG PO TABS: 2.5000 mg | ORAL_TABLET | Freq: Every day | ORAL | 1 refills | Status: DC

## 2018-04-11 NOTE — Progress Notes (Signed)
   Patient ID: Kathleen Gibson, female  DOB: 09/29/1950, 67 y.o.   MRN: [id number] Patient Care Team    Relationship Specialty Notifications Start End  Kuneff, Renee A, DO PCP - General Family Medicine  11/08/17   Danis, Henry L III, MD Consulting Physician Gastroenterology  04/06/17   Feng, Yan, MD Consulting Physician Hematology  04/06/17     Chief Complaint  Patient presents with  . Diabetes  . Hypertension    Subjective:  Kathleen Gibson is a 67 y.o.  female present for Diabetes: Pt reports compliance with janumet.  Blood sugars have not been routinely checked. Patient denies dizziness, hyperglycemic or hypoglycemic events. Patient denies numbness, tingling in the extremities or nonhealing wounds of feet.  PNA series: PNA23 06/2017, repeat prevnar 06/2018 Flu shot: Patient declined (recommneded yearly) BMP: 11/20/2017 GFR 55 Foot exam: 06/15/2017 Eye exam: Patient reports her last eye exam was October 2017. She knows she is overdue and will try to make an appt . A1c: 8..6--> 8.3--> 7.9--> 7.2--> 7.3 today  Hypertension/hypertriglyceridemia Pt reports compliance with toprol xl 25 mg qd and HCTZ 12.5 mg QD. Patient denies chest pain, shortness of breath, dizziness or lower extremity edema.  Pt does not take a daily baby ASA (low platelets). Pt is not prescribed statin. BMP: see above CBC: 11/20/2017 WBC 10.4, hgb 11.4, plt 44 Lipids: 11/07/2017 good with the exception of tg 171 Tsh: 04/06/2017 WNL Diet: Does not watch routinely Exercise: Does not exercise routinely RF: Hypertension, diabetes, hyperlipidemia, obesity  INSOMNIA, CHRONIC/RLS Ambien 5 mg not working as well as 10 mg. Gabapentin 600-900 mg QHS is working ok for RLS, but not always.   Chronic ITP (idiopathic thrombocytopenia) (HCC) Labs 11/07/2017 plts dropped to 34. She saw her heme in June and plt increased to 44. HEME encouraged her to follow up 3 mos with them and have monthly CBC. Per their note she would be managed  clinically as long as remained above 30K. Otherwise she would need referred to surgery for splenectomy. She denies increased bleeding or bruising today.   Depression screen PHQ 2/9 04/11/2018 10/25/2017 04/06/2017 08/27/2014  Decreased Interest 0 0 0 0  Down, Depressed, Hopeless - 0 0 0  PHQ - 2 Score 0 0 0 0   No flowsheet data found.     Fall Risk  04/11/2018 04/06/2017  Falls in the past year? No No    Immunization History  Administered Date(s) Administered  . Pneumococcal Conjugate-13 03/19/2015  . Pneumococcal Polysaccharide-23 06/03/2008, 07/11/2017  . Td 06/13/2006    No exam data present  Past Medical History:  Diagnosis Date  . Diabetes (HCC)   . HLD (hyperlipidemia)   . Hypertension   . Pernicious anemia    Allergies  Allergen Reactions  . Asa [Aspirin] Other (See Comments)    contraindicate with low plts.   . Nsaids     Contraindicated- low plts.    Past Surgical History:  Procedure Laterality Date  . ABDOMINAL HERNIA REPAIR  2009  . ABDOMINOPLASTY    . CESAREAN SECTION  1978  . GASTRIC BYPASS  2000  . hemorroid surgery  2007  . TONSILLECTOMY AND ADENOIDECTOMY  1957   Family History  Problem Relation Age of Onset  . Cancer Mother 85       unknown type cancer   . Hypertension Mother   . Hypertension Father   . Diabetes Sister   . Leukemia Brother   . Colon cancer Brother 56     Social History   Socioeconomic History  . Marital status: Divorced    Spouse name: Not on file  . Number of children: 3  . Years of education: 51  . Highest education level: Not on file  Occupational History  . Occupation: Retired  Scientific laboratory technician  . Financial resource strain: Not on file  . Food insecurity:    Worry: Not on file    Inability: Not on file  . Transportation needs:    Medical: Not on file    Non-medical: Not on file  Tobacco Use  . Smoking status: Never Smoker  . Smokeless tobacco: Never Used  Substance and Sexual Activity  . Alcohol use: No  .  Drug use: No  . Sexual activity: Never  Lifestyle  . Physical activity:    Days per week: Not on file    Minutes per session: Not on file  . Stress: Not on file  Relationships  . Social connections:    Talks on phone: Not on file    Gets together: Not on file    Attends religious service: Not on file    Active member of club or organization: Not on file    Attends meetings of clubs or organizations: Not on file    Relationship status: Not on file  . Intimate partner violence:    Fear of current or ex partner: Not on file    Emotionally abused: Not on file    Physically abused: Not on file    Forced sexual activity: Not on file  Other Topics Concern  . Not on file  Social History Narrative   Divorced. Retired Pension scheme manager.   12th grade education.   Drinks caffeine.   Smoke alarm in the home. Wears her seatbelt.   Feels safe in her relationships.   Allergies as of 04/11/2018      Reactions   Asa [aspirin] Other (See Comments)   contraindicate with low plts.    Nsaids    Contraindicated- low plts.       Medication List        Accurate as of 04/11/18 11:46 AM. Always use your most recent med list.          ferrous sulfate 325 (65 FE) MG tablet Take 1 tablet (325 mg total) by mouth 2 (two) times daily with a meal.   Fish Oil 1000 MG Caps Take 1 capsule by mouth daily.   gabapentin 300 MG capsule Commonly known as:  NEURONTIN Take 2-3 capsules (600-900 mg total) by mouth at bedtime.   glucose monitoring kit monitoring kit 1 each by Does not apply route as needed for other. Use as directed   glyBURIDE 2.5 MG tablet Commonly known as:  DIABETA Take 1 tablet (2.5 mg total) by mouth daily with breakfast.   hydrochlorothiazide 12.5 MG tablet Commonly known as:  HYDRODIURIL Take 1 tablet (12.5 mg total) by mouth daily.   metoprolol succinate 25 MG 24 hr tablet Commonly known as:  TOPROL-XL Take 1 tablet (25 mg total) by mouth daily.     SitaGLIPtin-MetFORMIN HCl [phone] MG Tb24 Take 1 tablet by mouth daily.   traZODone 50 MG tablet Commonly known as:  DESYREL Take 1-2 tablets (50-100 mg total) by mouth at bedtime as needed for sleep.   zolpidem 5 MG tablet Commonly known as:  AMBIEN Take 1 tablet (5 mg total) by mouth at bedtime.       All past medical history, surgical history, allergies, family history,  immunizations andmedications were updated in the EMR today and reviewed under the history and medication portions of their EMR.    Recent Results (from the past 2160 hour(s))  POCT glycosylated hemoglobin (Hb A1C)     Status: Abnormal   Collection Time: 04/11/18 10:55 AM  Result Value Ref Range   Hemoglobin A1C 7.3 (A) 4.0 - 5.6 %   HbA1c POC (<> result, manual entry)     HbA1c, POC (prediabetic range)     HbA1c, POC (controlled diabetic range)       ROS: 14 pt review of systems performed and negative (unless mentioned in an HPI)  Objective: BP 118/77 (BP Location: Left Arm, Patient Position: Sitting, Cuff Size: Large)   Pulse 80   Temp 98.2 F (36.8 C)   Resp 20   Ht 5' 3" (1.6 m)   Wt 194 lb (88 kg)   SpO2 97%   BMI 34.37 kg/m  Gen: Afebrile. No acute distress. Nontoxic. Obese caucasian female. Pleasant.  HENT: AT. [place].  MMM.  Eyes:Pupils Equal Round Reactive to light, Extraocular movements intact,  Conjunctiva without redness, discharge or icterus. Neck/lymp/endocrine: Supple,no lymphadenopathy, no thyromegaly CV: RRR, no edema, +2/4 P posterior tibialis pulses Chest: CTAB, no wheeze or crackles Abd: Soft. NTND. BS present . no Masses palpated.  Skin: no rashes, purpura or petechiae.  Neuro:  Normal gait. PERLA. EOMi. Alert. Oriented x3 Psych: Normal affect, dress and demeanor. Normal speech. Normal thought content and judgment.  Assessment/plan: Tawn M Harnish is a 67 y.o. female present for establishment of care (from [facility]) and chronic medical conditions. Essential  hypertension/hyperlipidemia/tachycardia - stable.  - Continue toprol- XL 25 mg QD and HCTZ 12.5 mg QD. Refills provided toay - Low-sodium diet, exercise. 150 minutes a week - ASA contraindicated with thrombocytopenia.  - F/U 4 mos  Diabetic polyneuropathy associated with type 2 diabetes mellitus (HCC) type 2 diabetes mellitus (HCC) - decent control, could improve. Discussed adding back low dose glyburide and she agreeable to that today.  - continue to decrease carb and sugar. Increase exercise.  - continue Janumet 100-1000 mg QD. Refills provided today - Added glyburide 2.5 mg back to regimen.  - Continue gabapentin (doses below) PNA series: PNA23 06/2017, repeat prevnar 06/2018 Flu shot: Patient declined (recommneded yearly) BMP: 11/20/2017 GFR 55 Foot exam: 06/15/2017 Eye exam: Patient reports her last eye exam was October 2017. She knows she is overdue and will try to make an appt . A1c: 8..6--> 8.3--> 7.9--> 7.2--> 7.3 today - F/U 4 mos.   INSOMNIA, CHRONIC/RLS - She reports Ambien 5 mg QD is not as effective as the 10 mg QD.  Decreased dose to 5 mg for safety profile age/female.  - gabapentin 600-900 qhs - taking iron daily, last panel normal 10/2017.  - discussed options with her today and she will try trazodone taper on weekends when she can not worry if she has a sleep hangover or SE. She understands not to use trazodone and ambien together.  - Refills on ambien provided today - if she finds trazodone works better for her she can DC ambien and let us know what trazodone dose worked for her nad we will call it in for her. She understands plan.   Thrombocytopenia (HCC)/ITP  She did follow with heme in June after PLT dropped. She has not followed up since then. - Heme note wanted a 3 mo follow up with them- she has not scheduled. They also asked she have her plt count   monitored monthly at PCP. We are not a draw station for other providers-there fore she would have need a appt w/  provider to manage them monthly and she did not make the appt here or with heme.   - CBC ordered today and will forward HEME the results as well. She was encouraged to make her follow up with them (due last month)    Return in about 4 months (around 08/11/2018).   Note is dictated utilizing voice recognition software. Although note has been proof read prior to signing, occasional typographical errors still can be missed. If any questions arise, please do not hesitate to call for verification.  Electronically signed by: Renee Kuneff, DO  Primary Care- [facility]  

## 2018-04-11 NOTE — Patient Instructions (Signed)
Meds changes:  - added glyburide to take with meal, rest of diabetes meds the same.  BP looks good. Insomnia/RLS:  - continue gabapentin. Ambien refill for 6 mos. Try trazodoen in place of [place] when able and taper dose to where you fall asleep and have restful sleep-nut no sleep hangover. You start at 50 mg a night of trazodone and you can increase by a half tab nightly up to 150 mg max to find the dose that works for you. You will need to call in to let us the know the dose that worked for you if you like this med.     Diabetes Mellitus and Nutrition When you have diabetes (diabetes mellitus), it is very important to have healthy eating habits because your blood sugar (glucose) levels are greatly affected by what you eat and drink. Eating healthy foods in the appropriate amounts, at about the same times every day, can help you:  Control your blood glucose.  Lower your risk of heart disease.  Improve your blood pressure.  Reach or maintain a healthy weight.  Every person with diabetes is different, and each person has different needs for a meal plan. Your health care provider may recommend that you work with a diet and nutrition specialist (dietitian) to make a meal plan that is best for you. Your meal plan may vary depending on factors such as:  The calories you need.  The medicines you take.  Your weight.  Your blood glucose, blood pressure, and cholesterol levels.  Your activity level.  Other health conditions you have, such as heart or kidney disease.  How do carbohydrates affect me? Carbohydrates affect your blood glucose level more than any other type of food. Eating carbohydrates naturally increases the amount of glucose in your blood. Carbohydrate counting is a method for keeping track of how many carbohydrates you eat. Counting carbohydrates is important to keep your blood glucose at a healthy level, especially if you use insulin or take certain oral diabetes  medicines. It is important to know how many carbohydrates you can safely have in each meal. This is different for every person. Your dietitian can help you calculate how many carbohydrates you should have at each meal and for snack. Foods that contain carbohydrates include:  Bread, cereal, rice, pasta, and crackers.  Potatoes and corn.  Peas, beans, and lentils.  Milk and yogurt.  Fruit and juice.  Desserts, such as cakes, cookies, ice cream, and candy.  How does alcohol affect me? Alcohol can cause a sudden decrease in blood glucose (hypoglycemia), especially if you use insulin or take certain oral diabetes medicines. Hypoglycemia can be a life-threatening condition. Symptoms of hypoglycemia (sleepiness, dizziness, and confusion) are similar to symptoms of having too much alcohol. If your health care provider says that alcohol is safe for you, follow these guidelines:  Limit alcohol intake to no more than 1 drink per day for nonpregnant women and 2 drinks per day for men. One drink equals 12 oz of beer, 5 oz of wine, or 1 oz of hard liquor.  Do not drink on an empty stomach.  Keep yourself hydrated with water, diet soda, or unsweetened iced tea.  Keep in mind that regular soda, juice, and other mixers may contain a lot of sugar and must be counted as carbohydrates.  What are tips for following this plan? Reading food labels  Start by checking the serving size on the label. The amount of calories, carbohydrates, fats, and other nutrients listed  on the label are based on one serving of the food. Many foods contain more than one serving per package.  Check the total grams (g) of carbohydrates in one serving. You can calculate the number of servings of carbohydrates in one serving by dividing the total carbohydrates by 15. For example, if a food has 30 g of total carbohydrates, it would be equal to 2 servings of carbohydrates.  Check the number of grams (g) of saturated and trans  fats in one serving. Choose foods that have low or no amount of these fats.  Check the number of milligrams (mg) of sodium in one serving. Most people should limit total sodium intake to less than 2,300 mg per day.  Always check the nutrition information of foods labeled as "low-fat" or "nonfat". These foods may be higher in added sugar or refined carbohydrates and should be avoided.  Talk to your dietitian to identify your daily goals for nutrients listed on the label. Shopping  Avoid buying canned, premade, or processed foods. These foods tend to be high in fat, sodium, and added sugar.  Shop around the outside edge of the grocery store. This includes fresh fruits and vegetables, bulk grains, fresh meats, and fresh dairy. Cooking  Use low-heat cooking methods, such as baking, instead of high-heat cooking methods like deep frying.  Cook using healthy oils, such as olive, canola, or sunflower oil.  Avoid cooking with butter, cream, or high-fat meats. Meal planning  Eat meals and snacks regularly, preferably at the same times every day. Avoid going long periods of time without eating.  Eat foods high in fiber, such as fresh fruits, vegetables, beans, and whole grains. Talk to your dietitian about how many servings of carbohydrates you can eat at each meal.  Eat 4-6 ounces of lean protein each day, such as lean meat, chicken, fish, eggs, or tofu. 1 ounce is equal to 1 ounce of meat, chicken, or fish, 1 egg, or 1/4 cup of tofu.  Eat some foods each day that contain healthy fats, such as avocado, nuts, seeds, and fish. Lifestyle   Check your blood glucose regularly.  Exercise at least 30 minutes 5 or more days each week, or as told by your health care provider.  Take medicines as told by your health care provider.  Do not use any products that contain nicotine or tobacco, such as cigarettes and e-cigarettes. If you need help quitting, ask your health care provider.  Work with a  Social worker or diabetes educator to identify strategies to manage stress and any emotional and social challenges. What are some questions to ask my health care provider?  Do I need to meet with a diabetes educator?  Do I need to meet with a dietitian?  What number can I call if I have questions?  When are the best times to check my blood glucose? Where to find more information:  American Diabetes Association: diabetes.org/food-and-fitness/food  Academy of Nutrition and Dietetics: PokerClues.dk  [employer] of Diabetes and Digestive and Kidney Diseases (NIH): ContactWire.be Summary  A healthy meal plan will help you control your blood glucose and maintain a healthy lifestyle.  Working with a diet and nutrition specialist (dietitian) can help you make a meal plan that is best for you.  Keep in mind that carbohydrates and alcohol have immediate effects on your blood glucose levels. It is important to count carbohydrates and to use alcohol carefully. This information is not intended to replace advice given to you by your  health care provider. Make sure you discuss any questions you have with your health care provider. Document Released: 02/24/2005 Document Revised: 07/04/2016 Document Reviewed: 07/04/2016 Elsevier Interactive Patient Education  [employer].

## 2018-04-12 ENCOUNTER — Telehealth: Payer: Self-pay | Admitting: Family Medicine

## 2018-04-12 NOTE — Telephone Encounter (Signed)
Please inform patient the following information: Her platelets are improving, she now up to 93, still lower than normal- but an improvement from last collections.  Her monocytes- a type of white blood cell continues to rise. These all can be attributed to a disease process, which can also cause the enlargement of her spleen,  which we know she has.  I do not want to scare her, but certain types of leukemia can act this way- so it is very important she make the follow up with her hematologist, which specializes in these conditions and can guide her further with evaluation and monitoring. She was due for followup with them last month, I would recommend she call to schedule.

## 2018-04-12 NOTE — Telephone Encounter (Signed)
Called patient left message for patient to return call 

## 2018-04-13 NOTE — Telephone Encounter (Signed)
Spoke with patient reviewed lab results and instructions. Patient verbalized understanding. 

## 2018-05-09 ENCOUNTER — Other Ambulatory Visit: Payer: Self-pay

## 2018-05-14 NOTE — Telephone Encounter (Signed)
Message left on voice mail for patient to return call regarding refill on Trazodone.

## 2018-05-14 NOTE — Telephone Encounter (Signed)
First make sure she is the one requesting med and nt pharmacy. Then Please read last of note- for instructions.

## 2018-05-15 NOTE — Telephone Encounter (Signed)
Message left on voice mail for patient to return call. 

## 2018-06-02 DIAGNOSIS — J018 Other acute sinusitis: Secondary | ICD-10-CM | POA: Diagnosis not present

## 2018-06-02 DIAGNOSIS — R509 Fever, unspecified: Secondary | ICD-10-CM | POA: Diagnosis not present

## 2018-06-15 ENCOUNTER — Ambulatory Visit: Payer: Medicare HMO | Admitting: Podiatry

## 2018-07-24 ENCOUNTER — Emergency Department (HOSPITAL_COMMUNITY)
Admission: EM | Admit: 2018-07-24 | Discharge: 2018-07-24 | Disposition: A | Discharge status: Home / Self Care | Payer: Medicare HMO | Attending: Emergency Medicine | Admitting: Emergency Medicine

## 2018-07-24 ENCOUNTER — Emergency Department (HOSPITAL_BASED_OUTPATIENT_CLINIC_OR_DEPARTMENT_OTHER)
Admit: 2018-07-24 | Discharge: 2018-07-24 | Disposition: A | Discharge status: Home / Self Care | Payer: Medicare HMO | Attending: Emergency Medicine | Admitting: Emergency Medicine

## 2018-07-24 ENCOUNTER — Emergency Department (HOSPITAL_COMMUNITY): Payer: Medicare HMO

## 2018-07-24 ENCOUNTER — Encounter (HOSPITAL_COMMUNITY): Payer: Self-pay

## 2018-07-24 DIAGNOSIS — I10 Essential (primary) hypertension: Secondary | ICD-10-CM | POA: Insufficient documentation

## 2018-07-24 DIAGNOSIS — E119 Type 2 diabetes mellitus without complications: Secondary | ICD-10-CM | POA: Insufficient documentation

## 2018-07-24 DIAGNOSIS — Z79899 Other long term (current) drug therapy: Secondary | ICD-10-CM | POA: Insufficient documentation

## 2018-07-24 DIAGNOSIS — M545 Low back pain: Secondary | ICD-10-CM | POA: Diagnosis not present

## 2018-07-24 DIAGNOSIS — R079 Chest pain, unspecified: Secondary | ICD-10-CM | POA: Diagnosis not present

## 2018-07-24 DIAGNOSIS — M2550 Pain in unspecified joint: Secondary | ICD-10-CM | POA: Diagnosis not present

## 2018-07-24 DIAGNOSIS — M79609 Pain in unspecified limb: Secondary | ICD-10-CM | POA: Diagnosis not present

## 2018-07-24 DIAGNOSIS — M79605 Pain in left leg: Secondary | ICD-10-CM | POA: Diagnosis not present

## 2018-07-24 DIAGNOSIS — M7989 Other specified soft tissue disorders: Secondary | ICD-10-CM

## 2018-07-24 DIAGNOSIS — Z7984 Long term (current) use of oral hypoglycemic drugs: Secondary | ICD-10-CM | POA: Insufficient documentation

## 2018-07-24 DIAGNOSIS — R509 Fever, unspecified: Secondary | ICD-10-CM | POA: Insufficient documentation

## 2018-07-24 DIAGNOSIS — R51 Headache: Secondary | ICD-10-CM | POA: Diagnosis not present

## 2018-07-24 DIAGNOSIS — R000 Tachycardia, unspecified: Secondary | ICD-10-CM | POA: Diagnosis not present

## 2018-07-24 LAB — CBC WITH DIFFERENTIAL/PLATELET
Abs Immature Granulocytes: 0.3 10*3/uL — ABNORMAL HIGH (ref 0.00–0.07)
Basophils Absolute: 0 10*3/uL (ref 0.0–0.1)
Basophils Relative: 0 %
EOS ABS: 0 10*3/uL (ref 0.0–0.5)
EOS PCT: 0 %
HEMATOCRIT: 37.3 % (ref 36.0–46.0)
Hemoglobin: 11.9 g/dL — ABNORMAL LOW (ref 12.0–15.0)
Immature Granulocytes: 2 %
Lymphocytes Relative: 7 %
Lymphs Abs: 0.9 10*3/uL (ref 0.7–4.0)
MCH: 29.2 pg (ref 26.0–34.0)
MCHC: 31.9 g/dL (ref 30.0–36.0)
MCV: 91.4 fL (ref 80.0–100.0)
Monocytes Absolute: 1.9 10*3/uL — ABNORMAL HIGH (ref 0.1–1.0)
Monocytes Relative: 14 %
Neutro Abs: 9.9 10*3/uL — ABNORMAL HIGH (ref 1.7–7.7)
Neutrophils Relative %: 77 %
Platelets: 86 10*3/uL — ABNORMAL LOW (ref 150–400)
RBC: 4.08 MIL/uL (ref 3.87–5.11)
RDW: 13.2 % (ref 11.5–15.5)
WBC: 13 10*3/uL — ABNORMAL HIGH (ref 4.0–10.5)
nRBC: 0 % (ref 0.0–0.2)

## 2018-07-24 LAB — COMPREHENSIVE METABOLIC PANEL
ALT: 16 U/L (ref 0–44)
AST: 19 U/L (ref 15–41)
Albumin: 4.2 g/dL (ref 3.5–5.0)
Alkaline Phosphatase: 55 U/L (ref 38–126)
Anion gap: 11 (ref 5–15)
BUN: 19 mg/dL (ref 8–23)
CO2: 22 mmol/L (ref 22–32)
Calcium: 8.4 mg/dL — ABNORMAL LOW (ref 8.9–10.3)
Chloride: 99 mmol/L (ref 98–111)
Creatinine, Ser: 0.99 mg/dL (ref 0.44–1.00)
GFR calc Af Amer: >60 mL/min (ref >60)
GFR, EST NON AFRICAN AMERICAN: 59 mL/min — AB (ref >60)
Glucose, Bld: 162 mg/dL — ABNORMAL HIGH (ref 70–99)
POTASSIUM: 3.4 mmol/L — AB (ref 3.5–5.1)
Sodium: 132 mmol/L — ABNORMAL LOW (ref 135–145)
Total Bilirubin: 1.3 mg/dL — ABNORMAL HIGH (ref 0.3–1.2)
Total Protein: 8 g/dL (ref 6.5–8.1)

## 2018-07-24 LAB — SEDIMENTATION RATE: SED RATE: 32 mm/h — AB (ref 0–22)

## 2018-07-24 LAB — URINALYSIS, ROUTINE W REFLEX MICROSCOPIC
Bilirubin Urine: NEGATIVE
GLUCOSE, UA: NEGATIVE mg/dL
Ketones, ur: 20 mg/dL — AB
Leukocytes, UA: NEGATIVE
Nitrite: NEGATIVE
PH: 5 (ref 5.0–8.0)
Protein, ur: NEGATIVE mg/dL
Specific Gravity, Urine: 1.021 (ref 1.005–1.030)

## 2018-07-24 LAB — INFLUENZA PANEL BY PCR (TYPE A & B)
INFLBPCR: NEGATIVE
Influenza A By PCR: NEGATIVE

## 2018-07-24 LAB — LACTIC ACID, PLASMA: Lactic Acid, Venous: 1 mmol/L (ref 0.5–1.9)

## 2018-07-24 LAB — TROPONIN I: Troponin I: <0.03 ng/mL (ref <0.03)

## 2018-07-24 LAB — LIPASE, BLOOD: LIPASE: 25 U/L (ref 11–51)

## 2018-07-24 LAB — CBG MONITORING, ED: Glucose-Capillary: 146 mg/dL — ABNORMAL HIGH (ref 70–99)

## 2018-07-24 MED ORDER — AMOXICILLIN 500 MG PO CAPS
500.0000 mg | ORAL_CAPSULE | Freq: Once | ORAL | Status: AC
  Administered 2018-07-24: 500 mg via ORAL
  Filled 2018-07-24: qty 1

## 2018-07-24 MED ORDER — MORPHINE SULFATE (PF) 4 MG/ML IV SOLN
4.0000 mg | Freq: Once | INTRAVENOUS | Status: AC
  Administered 2018-07-24: 4 mg via INTRAVENOUS
  Filled 2018-07-24: qty 1

## 2018-07-24 MED ORDER — PREDNISONE 20 MG PO TABS
20.0000 mg | ORAL_TABLET | Freq: Once | ORAL | Status: AC
  Administered 2018-07-24: 20 mg via ORAL
  Filled 2018-07-24: qty 1

## 2018-07-24 MED ORDER — METOPROLOL SUCCINATE ER 25 MG PO TB24
25.0000 mg | ORAL_TABLET | Freq: Every day | ORAL | Status: DC
  Administered 2018-07-24: 25 mg via ORAL
  Filled 2018-07-24 (×2): qty 1

## 2018-07-24 MED ORDER — ACETAMINOPHEN 325 MG PO TABS
650.0000 mg | ORAL_TABLET | Freq: Once | ORAL | Status: AC
  Administered 2018-07-24: 650 mg via ORAL
  Filled 2018-07-24: qty 2

## 2018-07-24 MED ORDER — SODIUM CHLORIDE 0.9 % IV BOLUS
1000.0000 mL | Freq: Once | INTRAVENOUS | Status: AC
  Administered 2018-07-24: 1000 mL via INTRAVENOUS

## 2018-07-24 MED ORDER — TRAMADOL HCL 50 MG PO TABS: 50.0000 mg | ORAL_TABLET | Freq: Four times a day (QID) | ORAL | 0 refills | Status: DC | PRN

## 2018-07-24 MED ORDER — AMOXICILLIN 500 MG PO CAPS: 500.0000 mg | ORAL_CAPSULE | Freq: Three times a day (TID) | ORAL | 0 refills | Status: DC

## 2018-07-24 MED ORDER — PREDNISONE 10 MG PO TABS: 20.0000 mg | ORAL_TABLET | Freq: Every day | ORAL | 0 refills | Status: DC

## 2018-07-24 NOTE — Progress Notes (Addendum)
Left lower extremity venous duplex completed. Preliminary results given to Dr. Melina Copa. Results in chart review CV Proc. [place] Mckynlee Luse,RVS 07/24/2018 8:58 AM

## 2018-07-24 NOTE — ED Notes (Signed)
Patient transported to X-ray 

## 2018-07-24 NOTE — ED Triage Notes (Signed)
Pt complains of general body aches and groin pains for two days, she also states that she is very weak and fatigued

## 2018-07-24 NOTE — Discharge Instructions (Signed)
You were seen in the emergency department for fatigue joint pain and a low-grade fever.  You had blood work chest x-ray urinalysis done that did not show an obvious explanation for your symptoms.  Your ESR was elevated and we are starting you on some prednisone.  We are also covering you with some antibiotics and pain medication.  You still have other blood tests that are pending and will need to be followed up with your primary care doctor.  Please return if any worsening symptoms.

## 2018-07-24 NOTE — ED Notes (Signed)
ED Provider at bedside. 

## 2018-07-24 NOTE — ED Notes (Signed)
Pt c/o "aching in her bones" x 4 weeks and leg/quad area pain x 1 week.  Denies being around anyone sick.  Sts "low grade fever" and has been taking ibuprofen.  Last dose around 1900.  Pt did not have flu shot.  Full ROM noted.  Pt able to stand and undress w/o assistance.

## 2018-07-24 NOTE — ED Provider Notes (Signed)
[facility] DEPT Provider Note   CSN: [id number] Arrival date & time: 07/24/18  0459     History   Chief Complaint Chief Complaint  Patient presents with  . Generalized Body Aches    HPI Kathleen Gibson is a 68 y.o. female.  She presents complaining of her bones hurting for 4 weeks plus.  It is in her hands and her low back and her knees her shoulders.  She is also noticed a charley horse feeling in her left inner thigh.  He has had some low-grade fevers.  She had a sinus infection in December.  She did not get a flu shot this year.  She has been using ibuprofen with minimal relief.  She said she had a tick bite in the summer and really has not felt well since then.  She was going to call her PCP today but the pain was so bad she decided to come here.  She is feeling very weak and fatigued.  Drinking plenty of fluids.  Does not check her sugars.  The history is provided by the patient.  Illness  Location:  Bone pain Quality:  Aching Severity:  Moderate Onset quality:  Gradual Timing:  Constant Progression:  Unchanged Chronicity:  New Relieved by:  Nothing Worsened by:  Movement Ineffective treatments:  Ibuprofen Associated symptoms: fatigue, fever, headaches and myalgias   Associated symptoms: no abdominal pain, no chest pain, no cough, no diarrhea, no loss of consciousness, no nausea, no rash, no rhinorrhea, no shortness of breath, no sore throat, no vomiting and no wheezing     Past Medical History:  Diagnosis Date  . Diabetes ([place])   . HLD (hyperlipidemia)   . Hypertension   . Pernicious anemia     Patient Active Problem List   Diagnosis Date Noted  . Monocytosis 11/07/2017  . Tachycardia 10/25/2017  . Iron deficiency 04/10/2017  . B12 deficiency 04/06/2017  . Hereditary and idiopathic peripheral neuropathy 05/25/2016  . Chronic ITP (idiopathic thrombocytopenia) ([facility]) 04/25/2015  . INSOMNIA, CHRONIC 10/06/2009  . Diabetic polyneuropathy  ([place]) 11/16/2007  . HLD (hyperlipidemia) 10/05/2007  . ANEMIA, PERNICIOUS 03/02/2007  . Diabetes mellitus out of control ([place]) 01/23/2007  . Essential hypertension 12/27/2006    Past Surgical History:  Procedure Laterality Date  . ABDOMINAL HERNIA REPAIR  2009  . ABDOMINOPLASTY    . [place]  . GASTRIC BYPASS  2000  . hemorroid surgery  2007  . TONSILLECTOMY AND ADENOIDECTOMY  1957     OB History    Gravida  3   Para  3   Term      Preterm      AB      Living  3     SAB      TAB      Ectopic      Multiple      Live Births               Home Medications    Prior to Admission medications   Medication Sig Start Date End Date Taking? Authorizing Provider  ferrous sulfate 325 (65 FE) MG tablet Take 1 tablet (325 mg total) by mouth 2 (two) times daily with a meal. 01/15/18   McGowen, Adrian Blackwater, MD  gabapentin (NEURONTIN) 300 MG capsule Take 2-3 capsules (600-900 mg total) by mouth at bedtime. 04/11/18   Kuneff, Renee A, DO  glucose monitoring kit (FREESTYLE) monitoring kit 1 each by Does not apply  route as needed for other. Use as directed 02/24/14   Lucille Passy, MD  glyBURIDE (DIABETA) 2.5 MG tablet Take 1 tablet (2.5 mg total) by mouth daily with breakfast. 04/11/18   Kuneff, Renee A, DO  hydrochlorothiazide (HYDRODIURIL) 12.5 MG tablet Take 1 tablet (12.5 mg total) by mouth daily. 04/11/18   Kuneff, Renee A, DO  metoprolol succinate (TOPROL-XL) 25 MG 24 hr tablet Take 1 tablet (25 mg total) by mouth daily. 04/11/18   Kuneff, Renee A, DO  Omega-3 Fatty Acids (FISH OIL) 1000 MG CAPS Take 1 capsule by mouth daily.    [provider]  SitaGLIPtin-MetFORMIN HCl [phone] MG TB24 Take 1 tablet by mouth daily. 04/11/18   Kuneff, Renee A, DO  traZODone (DESYREL) 50 MG tablet Take 1-2 tablets (50-100 mg total) by mouth at bedtime as needed for sleep. 04/11/18   Kuneff, Renee A, DO  zolpidem (AMBIEN) 5 MG tablet Take 1 tablet (5 mg total) by mouth at  bedtime. 04/11/18   [name], DO    Family History Family History  Problem Relation Age of Onset  . Cancer Mother 62       unknown type cancer   . Hypertension Mother   . Hypertension Father   . Diabetes Sister   . Leukemia Brother   . Colon cancer Brother 31    Social History Social History   Tobacco Use  . Smoking status: Never Smoker  . Smokeless tobacco: Never Used  Substance Use Topics  . Alcohol use: No  . Drug use: No     Allergies   Asa [aspirin] and Nsaids   Review of Systems Review of Systems  Constitutional: Positive for fatigue and fever.  HENT: Negative for rhinorrhea and sore throat.   Eyes: Negative for visual disturbance.  Respiratory: Negative for cough, shortness of breath and wheezing.   Cardiovascular: Negative for chest pain.  Gastrointestinal: Negative for abdominal pain, diarrhea, nausea and vomiting.  Genitourinary: Negative for dysuria.  Musculoskeletal: Positive for arthralgias, back pain and myalgias.  Skin: Negative for rash.  Neurological: Positive for headaches. Negative for seizures and loss of consciousness.     Physical Exam Updated Vital Signs BP 131/67 (BP Location: Left Arm)   Pulse (!) 128   Temp 99.6 F (37.6 C) (Oral)   Resp 16   Ht _0  (1.6 m)   Wt 88.5 kg   SpO2 96%   BMI 34.54 kg/m   Physical Exam Vitals signs and nursing note reviewed.  Constitutional:      General: She is not in acute distress.    Appearance: She is well-developed.  HENT:     Head: Normocephalic and atraumatic.  Eyes:     Conjunctiva/sclera: Conjunctivae normal.  Neck:     Musculoskeletal: Neck supple.  Cardiovascular:     Rate and Rhythm: Regular rhythm. Tachycardia present.     Pulses: Normal pulses.     Heart sounds: No murmur.  Pulmonary:     Effort: Pulmonary effort is normal. No respiratory distress.     Breath sounds: Normal breath sounds.  Abdominal:     Palpations: Abdomen is soft.     Tenderness: There is no  abdominal tenderness.  Musculoskeletal: Normal range of motion.        General: Tenderness present. No deformity or signs of injury.     Right lower leg: No edema.     Left lower leg: No edema.     Comments: She has some tenderness  through her lumbar and paralumbar area.  I examined her upper and lower extremities and there were no particular hot red or swollen joints.  There was some generalized discomfort on movement.  She had some tenderness in her left upper inner thigh although no appreciable cords.  No overlying skin changes.  Skin:    General: Skin is warm and dry.     Capillary Refill: Capillary refill takes less than 2 seconds.  Neurological:     General: No focal deficit present.     Mental Status: She is alert and oriented to person, place, and time.     Sensory: No sensory deficit.     Motor: No weakness.      ED Treatments / Results  Labs (all labs ordered are listed, but only abnormal results are displayed) Labs Reviewed  COMPREHENSIVE METABOLIC PANEL - Abnormal; Notable for the following components:      Result Value   Sodium 132 (*)    Potassium 3.4 (*)    Glucose, Bld 162 (*)    Calcium 8.4 (*)    Total Bilirubin 1.3 (*)    GFR calc non Af Amer 59 (*)    All other components within normal limits  CBC WITH DIFFERENTIAL/PLATELET - Abnormal; Notable for the following components:   WBC 13.0 (*)    Hemoglobin 11.9 (*)    Platelets 86 (*)    Neutro Abs 9.9 (*)    Monocytes Absolute 1.9 (*)    Abs Immature Granulocytes 0.30 (*)    All other components within normal limits  SEDIMENTATION RATE - Abnormal; Notable for the following components:   Sed Rate 32 (*)    All other components within normal limits  URINALYSIS, ROUTINE W REFLEX MICROSCOPIC - Abnormal; Notable for the following components:   Hgb urine dipstick SMALL (*)    Ketones, ur 20 (*)    Bacteria, UA RARE (*)    All other components within normal limits  CBG MONITORING, ED - Abnormal; Notable for the  following components:   Glucose-Capillary 146 (*)    All other components within normal limits  LIPASE, BLOOD  TROPONIN I  LACTIC ACID, PLASMA  INFLUENZA PANEL BY PCR (TYPE A & B)  LYME DISEASE DNA BY PCR(BORRELIA BURG)  RHEUMATOID FACTOR  LUPUS ANTICOAGULANT PANEL  ANTINUCLEAR ANTIBODIES, IFA    EKG EKG Interpretation  Date/Time:  Tuesday July 24 2018 07:35:21 EST Ventricular Rate:  119 PR Interval:    QRS Duration: 76 QT Interval:  320 QTC Calculation: 451 R Axis:   65 Text Interpretation:  Sinus tachycardia Aberrant complex Low voltage, extremity leads increased rate from prior 2/09 Confirmed by Aletta Edouard [phone]) on 07/24/2018 8:08:35 AM Also confirmed by Aletta Edouard [phone]), editor Philomena Doheny [phone])  on 07/24/2018 12:00:19 PM   Radiology Dg Chest 2 View  Result Date: 07/24/2018 CLINICAL DATA:  Chest pain EXAM: CHEST - 2 VIEW COMPARISON:  September 07, 2017 FINDINGS: There is no appreciable edema or consolidation. Heart size and pulmonary vascularity are normal. No adenopathy. No pneumothorax. No bone lesions. IMPRESSION: No edema or consolidation. Electronically Signed   By: Lowella Grip III M.D.   On: 07/24/2018 08:30   Dg Lumbar Spine Complete  Result Date: 07/24/2018 CLINICAL DATA:  Lumbago EXAM: LUMBAR SPINE - COMPLETE 4+ VIEW COMPARISON:  None. FINDINGS: Frontal, lateral, spot lumbosacral lateral, and bilateral oblique views were obtained. There are 5 non-rib-bearing lumbar type vertebral bodies. There is no fracture or spondylolisthesis. Disc spaces appear  unremarkable. There is facet osteoarthritic change at L5-S1 bilaterally. There is mild calcification in the abdominal aorta. IMPRESSION: Facet osteoarthritic change at L5-S1 bilaterally. No appreciable disc space narrowing. No fracture or spondylolisthesis. There is mild aortic atherosclerotic calcification. Electronically Signed   By: Lowella Grip III M.D.   On: 07/24/2018 08:31   Vas [place] Lower  Extremity Venous (dvt) (mc And Wl 7a-7p)  Result Date: 07/24/2018  Lower Venous Study Indications: Pain, and Swelling.  Risk Factors: Trauma Fall two weeks ago however pain started before the fall. Performing Technologist: Toma Copier RVS  Examination Guidelines: A complete evaluation includes B-mode imaging, spectral Doppler, color Doppler, and power Doppler as needed of all accessible portions of each vessel. Bilateral testing is considered an integral part of a complete examination. Limited examinations for reoccurring indications may be performed as noted.  Right Venous Findings: +---+---------------+---------+-----------+----------+-------+    CompressibilityPhasicitySpontaneityPropertiesSummary +---+---------------+---------+-----------+----------+-------+ CFVFull           Yes      Yes                          +---+---------------+---------+-----------+----------+-------+ SFJFull                                                 +---+---------------+---------+-----------+----------+-------+  Left Venous Findings: +---------+---------------+---------+-----------+----------+-------+          CompressibilityPhasicitySpontaneityPropertiesSummary +---------+---------------+---------+-----------+----------+-------+ CFV      Full           Yes      Yes                          +---------+---------------+---------+-----------+----------+-------+ SFJ      Full                                                 +---------+---------------+---------+-----------+----------+-------+ FV Prox  Full           Yes      Yes                          +---------+---------------+---------+-----------+----------+-------+ FV Mid   Full                                                 +---------+---------------+---------+-----------+----------+-------+ FV DistalFull           Yes      Yes                           +---------+---------------+---------+-----------+----------+-------+ PFV      Full           Yes      Yes                          +---------+---------------+---------+-----------+----------+-------+ POP      Full           Yes      Yes                          +---------+---------------+---------+-----------+----------+-------+  PTV      Full                                                 +---------+---------------+---------+-----------+----------+-------+ PERO     Full                                                 +---------+---------------+---------+-----------+----------+-------+    Summary: Right: There is no evidence of a common femoral vein obstruction Left: There is no evidence of deep vein thrombosis in the lower extremity. No cystic structure found in the popliteal fossa.  *See table(s) above for measurements and observations. Electronically signed by Deitra Mayo MD on 07/24/2018 at 4:56:18 PM.    Final     Procedures Procedures (including critical care time)  Medications Ordered in ED Medications  sodium chloride 0.9 % bolus 1,000 mL (0 mLs Intravenous Stopped 07/24/18 0900)  morphine 4 MG/ML injection 4 mg (4 mg Intravenous Given 07/24/18 0733)  acetaminophen (TYLENOL) tablet 650 mg (650 mg Oral Given 07/24/18 1015)  predniSONE (DELTASONE) tablet 20 mg (20 mg Oral Given 07/24/18 1015)  amoxicillin (AMOXIL) capsule 500 mg (500 mg Oral Given 07/24/18 1012)     Initial Impression / Assessment and Plan / ED Course  I have reviewed the triage vital signs and the nursing notes.  Pertinent labs & imaging results that were available during my care of the patient were reviewed by me and considered in my medical decision making (see chart for details).  Clinical Course as of Jul 25 1743  Tue Jul 24, 2018  0905 Pulmonary DVT study is negative for acute DVT.  Chest x-ray and spine x-rays were fairly unremarkable which is some DJD.  Lab work coming back without  significant finding so far other than a low platelet which is been noted before.   [MB]  [place] discussion with the patient and her daughter along with family friends.  Ultimately have not found an exact cause of her symptoms but there are a lot of inflammatory markers still pending.  This possibly is PMR and I think she may benefit from steroids.  Daughter is asking if we can release cover with some antibiotics in case there is some smoldering infection and she had that prior history of a possible tick bite in the summer.  They are asking if we can get her on some amoxicillin in the meantime till her tests are back.  I do not see any real harm in this.  Her temps up to 100.0 and she remains tachycardic.  She is complaining of a headache.  She did not take her medications this morning so I have reordered her her beta-blocker.   [MB]    Clinical Course User Index [MB] Hayden Rasmussen, MD     Final Clinical Impressions(s) / ED Diagnoses   Final diagnoses:  Arthralgia of multiple joints  Fever, unspecified fever cause    ED Discharge Orders         Ordered    predniSONE (DELTASONE) 10 MG tablet  Daily     07/24/18 1202    amoxicillin (AMOXIL) 500 MG capsule  3 times daily     07/24/18 1202    traMADol (  ULTRAM) 50 MG tablet  Every 6 hours PRN     07/24/18 1202           Hayden Rasmussen, MD 07/24/18 1746

## 2018-07-24 NOTE — ED Notes (Signed)
Patient stated they have been having generalized body weakness for a few weeks after having a tick removed [facility].

## 2018-07-24 NOTE — ED Notes (Signed)
Patient given discharge teaching and verbalized understanding. Patient taken out of ED with wheelchair.  

## 2018-07-25 LAB — LUPUS ANTICOAGULANT PANEL
DRVVT: 42 s (ref 0.0–47.0)
PTT Lupus Anticoagulant: 32.6 s (ref 0.0–51.9)

## 2018-07-25 LAB — ANTINUCLEAR ANTIBODIES, IFA: ANA Ab, IFA: NEGATIVE

## 2018-07-25 LAB — RHEUMATOID FACTOR: Rheumatoid fact SerPl-aCnc: 11.2 IU/mL (ref 0.0–13.9)

## 2018-07-26 ENCOUNTER — Encounter: Payer: Self-pay | Admitting: Family Medicine

## 2018-07-26 ENCOUNTER — Ambulatory Visit (INDEPENDENT_AMBULATORY_CARE_PROVIDER_SITE_OTHER): Payer: Medicare HMO | Admitting: Family Medicine

## 2018-07-26 VITALS — BP 119/78 | HR 106 | Temp 97.5°F | Resp 16 | Ht 63.0 in | Wt 190.1 lb

## 2018-07-26 DIAGNOSIS — R531 Weakness: Secondary | ICD-10-CM | POA: Diagnosis not present

## 2018-07-26 DIAGNOSIS — M2550 Pain in unspecified joint: Secondary | ICD-10-CM

## 2018-07-26 DIAGNOSIS — M791 Myalgia, unspecified site: Secondary | ICD-10-CM | POA: Diagnosis not present

## 2018-07-26 DIAGNOSIS — R000 Tachycardia, unspecified: Secondary | ICD-10-CM

## 2018-07-26 LAB — COMPREHENSIVE METABOLIC PANEL
ALK PHOS: 48 U/L (ref 39–117)
ALT: 11 U/L (ref 0–35)
AST: 13 U/L (ref 0–37)
Albumin: 4 g/dL (ref 3.5–5.2)
BUN: 20 mg/dL (ref 6–23)
CO2: 29 mEq/L (ref 19–32)
Calcium: 8.7 mg/dL (ref 8.4–10.5)
Chloride: 103 mEq/L (ref 96–112)
Creatinine, Ser: 0.87 mg/dL (ref 0.40–1.20)
GFR: 64.96 mL/min (ref >60.00)
Glucose, Bld: 220 mg/dL — ABNORMAL HIGH (ref 70–99)
POTASSIUM: 4.2 meq/L (ref 3.5–5.1)
Sodium: 139 mEq/L (ref 135–145)
Total Bilirubin: 0.5 mg/dL (ref 0.2–1.2)
Total Protein: 7.3 g/dL (ref 6.0–8.3)

## 2018-07-26 LAB — LYME DISEASE DNA BY PCR(BORRELIA BURG): Lyme Disease(B.burgdorferi)PCR: NEGATIVE

## 2018-07-26 LAB — VITAMIN B12: VITAMIN B 12: 402 pg/mL (ref 211–911)

## 2018-07-26 LAB — TSH: TSH: 1.76 u[IU]/mL (ref 0.35–4.50)

## 2018-07-26 LAB — CK: Total CK: 33 U/L (ref 7–177)

## 2018-07-26 MED ORDER — PREDNISONE 20 MG PO TABS: ORAL_TABLET | ORAL | 0 refills | Status: DC

## 2018-07-26 NOTE — Progress Notes (Signed)
Kathleen Gibson , 1950/10/25, 68 y.o., female MRN: [id number] Patient Care Team    Relationship Specialty Notifications Start End  [name], DO PCP - General Family Medicine  11/08/17   Doran Stabler, MD Consulting Physician Gastroenterology  04/06/17   Truitt Merle, MD Consulting Physician Hematology  04/06/17     Chief Complaint  Patient presents with  . Follow-up    F/U visit from ED visit. Continues to have pain in Left leg and lower back. Pt states she is doing better but still not great.      Subjective: Pt presents for an OV after ED admission 07/24/2018.  She is present today with her daughter.  She states she presented to the ED because she has had increasing arthralgia/bones hurting for the past 4 weeks that intensified in her hands, low back, knees and shoulders.  She also had a charley horse feeling in her left thigh.  She was feeling extremely weak and fatigued.  Her daughter reports her mom has been pushing herself more lately working outside.  Her daughter does not feel she is resting enough.  She had a tick bite over the summer and was wondering if this could be related to her exposure to possibly Lyme.  She had also endorsed low-grade fevers.  EKG with sinus tachycardia. Chemistry with elevated glucose162, mildly low calcium at 3.4 low calcium at 8.4, elevated bilirubin at 1.3.  Renal function stable.  Troponin negative.  Mild elevation in white count at 13k with increased neutrophil absolute of 9.9.  Platlets chronically low and stable.  Sed rate mildly elevated at 32.  Anticardiolipin antibodies normal, lupus anticoagulant screen normal, PTT lupus anticoagulant normal.  Lyme titer negative.  ANA negative, RA latex turbid normal.  Influenza negative.  She did have ketones in her urine, small hemoglobin otherwise unremarkable.  X-ray was normal.  Her x-ray with mild degeneration and some fossa arthropathy in the lumbar spine.  No acute processes.  Venous ultrasound of lower  extremity without clots or acute process.  She reports she is no longer taking the tramadol, she did not feel it was that effective and would rather not take a controlled substance if it is not working well for her. She was provided with prednisone, amoxicillin and tramadol for pain from the emergency room.  She is following up here today, reports she does feel mildly improved.  She has been taking the steroid and amoxicillin over the weekend and resting.  Hydrating.  She is not eating her normal diet but she reports she is eating and drinking adequately.  Reports the fever have resolved.  She still does not feel back to her normal self.  She reports she has been taking care of some new chickens.  She has 4 that she cares for.  She reports the rooster does bite her frequently but she denies any nonhealing wound, redness or drainage or break in skin. Of note, she has had kappa light chains and thrombocytopenia that was followed by oncology.  She has had pernicious anemia and had B12 shots in the past, however most recent B12 levels have been adequate.  Depression screen [facility] 2/9 04/11/2018 10/25/2017 04/06/2017 08/27/2014  Decreased Interest 0 0 0 0  Down, Depressed, Hopeless - 0 0 0  PHQ - 2 Score 0 0 0 0    Allergies  Allergen Reactions  . Asa [Aspirin] Other (See Comments)    Contraindicated d/t low plts  . Nsaids Other (  See Comments)    Contraindicated d/t low plts   Social History   Social History Narrative   Divorced. Retired Pension scheme manager.   12th grade education.   Drinks caffeine.   Smoke alarm in the home. Wears her seatbelt.   Feels safe in her relationships.   Past Medical History:  Diagnosis Date  . Diabetes ([place])   . HLD (hyperlipidemia)   . Hypertension   . Pernicious anemia    Past Surgical History:  Procedure Laterality Date  . ABDOMINAL HERNIA REPAIR  2009  . ABDOMINOPLASTY    . [place]  . GASTRIC BYPASS  2000  . hemorroid surgery  2007  .  TONSILLECTOMY AND ADENOIDECTOMY  1957   Family History  Problem Relation Age of Onset  . Cancer Mother 87       unknown type cancer   . Hypertension Mother   . Hypertension Father   . Diabetes Sister   . Leukemia Brother   . Colon cancer Brother 19   Allergies as of 07/26/2018      Reactions   Asa [aspirin] Other (See Comments)   Contraindicated d/t low plts   Nsaids Other (See Comments)   Contraindicated d/t low plts      Medication List       Accurate as of July 26, 2018 10:24 AM. Always use your most recent med list.        amoxicillin 500 MG capsule Commonly known as:  AMOXIL Take 1 capsule (500 mg total) by mouth 3 (three) times daily.   ferrous sulfate 325 (65 FE) MG tablet Take 1 tablet (325 mg total) by mouth 2 (two) times daily with a meal.   Fish Oil 1000 MG Caps Take 1,000 mg by mouth daily.   gabapentin 300 MG capsule Commonly known as:  NEURONTIN Take 2-3 capsules (600-900 mg total) by mouth at bedtime.   glucose monitoring kit monitoring kit 1 each by Does not apply route as needed for other. Use as directed   glyBURIDE 2.5 MG tablet Commonly known as:  DIABETA Take 1 tablet (2.5 mg total) by mouth daily with breakfast.   hydrochlorothiazide 12.5 MG tablet Commonly known as:  HYDRODIURIL Take 1 tablet (12.5 mg total) by mouth daily.   metoprolol succinate 25 MG 24 hr tablet Commonly known as:  TOPROL-XL Take 1 tablet (25 mg total) by mouth daily.   predniSONE 10 MG tablet Commonly known as:  DELTASONE Take 2 tablets (20 mg total) by mouth daily.   SitaGLIPtin-MetFORMIN HCl [id number] MG Tb24 Take 1 tablet by mouth daily.   traMADol 50 MG tablet Commonly known as:  ULTRAM Take 1 tablet (50 mg total) by mouth every 6 (six) hours as needed.   traZODone 50 MG tablet Commonly known as:  DESYREL Take 1-2 tablets (50-100 mg total) by mouth at bedtime as needed for sleep.   zolpidem 5 MG tablet Commonly known as:  AMBIEN Take 1 tablet (5  mg total) by mouth at bedtime.       All past medical history, surgical history, allergies, family history, immunizations andmedications were updated in the EMR today and reviewed under the history and medication portions of their EMR.     ROS: Negative, with the exception of above mentioned in HPI   Objective:  BP 119/78 (BP Location: Left Arm, Patient Position: Sitting, Cuff Size: Normal)   Pulse (!) 106   Temp (!) 97.5 F (36.4 C) (Oral)   Resp 16  Ht '5\' 3"'$  (1.6 m)   Wt 190 lb 2 oz (86.2 kg)   SpO2 96%   BMI 33.68 kg/m  Body mass index is 33.68 kg/m. Gen: Afebrile. No acute distress. Nontoxic in appearance, well developed, well nourished.  Appears tired HENT: AT. [place]. Bilateral TM visualized out erythema, or bulging. MMM, no oral lesions. Bilateral nares erythema, swelling or drainage. Throat without erythema or exudates.  No cough.  No hoarseness. Eyes:Pupils Equal Round Reactive to light, Extraocular movements intact,  Conjunctiva without redness, discharge or icterus. Neck/lymp/endocrine: Supple, no lymphadenopathy CV: Mildly tachycardic, no edema, no murmur Chest: CTAB, no wheeze or crackles. Good air movement, normal resp effort.  Abd: Soft. NTND. BS present.  MSK: No erythema, no soft tissue swelling.  Tenderness to palpation left thigh.  More range of motion, neurovascular intact distally. Skin: No rashes, purpura or petechiae.  Warm and well perfused.  Intact.  No wounds. Neuro:  Normal gait. PERLA. EOMi. Alert. Oriented x3  Psych: Normal affect, dress and demeanor. Normal speech. Normal thought content and judgment.  No exam data present No results found. No results found for this or any previous visit (from the past 24 hour(s)).  Assessment/Plan: EMILYN RUBLE is a 68 y.o. female present for OV for  Myalgia/weakness/tachycardia/polyarthralgia -Other extensive work-up performed in the emergency room without obvious etiology of her symptoms.   -She did have some  electrolyte imbalance/anion gap with ketones in her urine, sugar was elevated but only at 162.  SHe is a diabetic.  Repeat CMP today. -She is tachycardic, continues to be tachycardic on follow-up consider increasing her metoprolol dose. -Rule out thyroid disorder, B12 deficiency and check CK today -labs -B12 low or below 500, consider adding back B12 injections if patient desires. -Sugars remain elevated after acute illness, consider increasing glipizide. -Consider SPEP/UPEP repeat and or oncology follow-up, question if related to her increased monocytes, kappa light chains and thrombocytopenia. -Possibly could been related to mild illness w/elevated WBC and absolute neutrophils-although nothing identified.  Fevers have resolved fortunately. ?chickens? -PMR could be a potential diagnosis of mildly elevated ESR. -Finish amoxicillin, continue steroid taper-hydrate. Follow-up in 2 weeks and consider SPEP UPEP, recheck heart rate consider increase metoprolol, check sugars consider increase glipizide    Reviewed expectations re: course of current medical issues.  Discussed self-management of symptoms.  Outlined signs and symptoms indicating need for more acute intervention.  Patient verbalized understanding and all questions were answered.  Patient received an After-Visit Summary.    No orders of the defined types were placed in this encounter.  Greater than 40 minutes spent with patient, >50% of time spent face to face counseling, reviewing multiple imaging and labs completed in the emergency room, past records and coordinating care.     Note is dictated utilizing voice recognition software. Although note has been proof read prior to signing, occasional typographical errors still can be missed. If any questions arise, please do not hesitate to call for verification.   electronically signed by:  Howard Pouch, DO  [place]

## 2018-07-26 NOTE — Patient Instructions (Addendum)
Labs today- electrolytes are abnormal Hydrate with water, and start V8 daily.  Use the prednisone taper I supplied, continue antibiotic they supplied.    Uncertain right now cause--- lets wait on labs.

## 2018-07-27 ENCOUNTER — Telehealth: Payer: Self-pay | Admitting: Family Medicine

## 2018-07-27 NOTE — Telephone Encounter (Signed)
Please inform patient the following information: Her labs have improved greatly and are back to normal with the exception her sugar was over 200.  Her lyme screen also returned and was negative.  Keep follow up as scheduled on the 27th- keep hydrating, routine meals and abx/steroid. We will investigate further depending upon her response the treatment.

## 2018-07-27 NOTE — Telephone Encounter (Signed)
Called and LM on patients Cell phone to return call in regards to lab results.  Okay for PEC to discuss results/PCP recommendations.

## 2018-07-31 ENCOUNTER — Encounter: Payer: Self-pay | Admitting: Family Medicine

## 2018-07-31 DIAGNOSIS — M2550 Pain in unspecified joint: Secondary | ICD-10-CM | POA: Insufficient documentation

## 2018-07-31 NOTE — Telephone Encounter (Signed)
Called and spoke with patient about lab results and plan of care. Pt verbalized understanding.

## 2018-08-06 ENCOUNTER — Ambulatory Visit: Payer: Self-pay

## 2018-08-06 NOTE — Telephone Encounter (Signed)
Pt c/o hives and "pimples" of varying sizes to legs arms and back. Most are pin head in size and some are slightly larger. The bumps are red and the skin becomes red when she scratches it. Pt c/o the rash also burns. Pt has been using Benadryl and Hydrocortisone. Pt stated the Benadryl helps briefly but symptoms continue. Hives began on Friday. Pt was finished taking steroids the day after the rash and finished the abx on the day the rash appeared. No SOB, tongue swelling or wheezing.  Care advice given and pt verbalized understanding. Offered an appt tomorrow but pt refused. Pt stated she will call back if the rash worsens.  Pt stated that she has an appt with PCP on Thursday and will wait until then Advised pt to call if  Rash worsens and to call 911 for any breathing issues, SOB or wheezing.  Reason for Disposition . [1] MODERATE-SEVERE hives persist (i.e., hives interfere with normal activities or work) AND [2] taking antihistamine (e.g., Benadryl, Claritin) > 24 hours  Answer Assessment - Initial Assessment Questions 1. APPEARANCE: "What does the rash look like?"      Little raised pimples  Some look like under the skin- redness 2. LOCATION: "Where is the rash located?"      Legs, arms, back 3. NUMBER: "How many hives are there?"     Cannot count too many to count 4. SIZE: "How big are the hives?" (inches, cm, compare to coins) "Do they all look the same or is there lots of variation in shape and size?"      Pin head some are bigger than that 5. ONSET: "When did the hives begin?" (Hours or days ago)      Friday 6. ITCHING: "Does it itch?" If so, ask: "How bad is the itch?"    - MILD: doesn't interfere with normal activities   - MODERATE - SEVERE: interferes with work, school, sleep, or other activities      Itches but not poison ivy itch a constant burn 7. RECURRENT PROBLEM: "Have you had hives before?" If so, ask: "When was the last time?" and "What happened that time?"      no 8.  TRIGGERS: "Were you exposed to any new food, plant, cosmetic product or animal just before the hives began?"     Poison ivy bug bites but not to this extent-- just finished Abx and steroids before rash appeared steroid LD Sat ABX LD Friday 9. OTHER SYMPTOMS: "Do you have any other symptoms?" (e.g., fever, tongue swelling, difficulty breathing, abdominal pain)     no 10. PREGNANCY: "Is there any chance you are pregnant?" "When was your last menstrual period?"       n/a  Protocols used: HIVES-A-AH

## 2018-08-09 ENCOUNTER — Ambulatory Visit (INDEPENDENT_AMBULATORY_CARE_PROVIDER_SITE_OTHER): Payer: Medicare HMO | Admitting: Family Medicine

## 2018-08-09 ENCOUNTER — Encounter: Payer: Self-pay | Admitting: Family Medicine

## 2018-08-09 VITALS — BP 116/75 | HR 91 | Temp 97.7°F | Resp 17 | Ht 63.0 in | Wt 191.1 lb

## 2018-08-09 DIAGNOSIS — G4700 Insomnia, unspecified: Secondary | ICD-10-CM

## 2018-08-09 DIAGNOSIS — I10 Essential (primary) hypertension: Secondary | ICD-10-CM | POA: Diagnosis not present

## 2018-08-09 DIAGNOSIS — R000 Tachycardia, unspecified: Secondary | ICD-10-CM

## 2018-08-09 DIAGNOSIS — E538 Deficiency of other specified B group vitamins: Secondary | ICD-10-CM

## 2018-08-09 DIAGNOSIS — E1140 Type 2 diabetes mellitus with diabetic neuropathy, unspecified: Secondary | ICD-10-CM | POA: Diagnosis not present

## 2018-08-09 DIAGNOSIS — M791 Myalgia, unspecified site: Secondary | ICD-10-CM

## 2018-08-09 DIAGNOSIS — E1142 Type 2 diabetes mellitus with diabetic polyneuropathy: Secondary | ICD-10-CM

## 2018-08-09 DIAGNOSIS — R531 Weakness: Secondary | ICD-10-CM

## 2018-08-09 DIAGNOSIS — E785 Hyperlipidemia, unspecified: Secondary | ICD-10-CM | POA: Diagnosis not present

## 2018-08-09 DIAGNOSIS — D693 Immune thrombocytopenic purpura: Secondary | ICD-10-CM | POA: Diagnosis not present

## 2018-08-09 DIAGNOSIS — E669 Obesity, unspecified: Secondary | ICD-10-CM

## 2018-08-09 DIAGNOSIS — R21 Rash and other nonspecific skin eruption: Secondary | ICD-10-CM | POA: Insufficient documentation

## 2018-08-09 DIAGNOSIS — Z5320 Procedure and treatment not carried out because of patient's decision for unspecified reasons: Secondary | ICD-10-CM

## 2018-08-09 DIAGNOSIS — D510 Vitamin B12 deficiency anemia due to intrinsic factor deficiency: Secondary | ICD-10-CM

## 2018-08-09 LAB — POCT GLYCOSYLATED HEMOGLOBIN (HGB A1C)
HEMOGLOBIN A1C: 6.6 % — AB (ref 4.0–5.6)
HbA1c POC (<> result, manual entry): 6.6 % (ref 4.0–5.6)
HbA1c, POC (controlled diabetic range): 6.6 % (ref 0.0–7.0)
HbA1c, POC (prediabetic range): 6.6 % — AB (ref 5.7–6.4)

## 2018-08-09 MED ORDER — METOPROLOL SUCCINATE ER 50 MG PO TB24: 50.0000 mg | ORAL_TABLET | Freq: Every day | ORAL | 1 refills | Status: DC

## 2018-08-09 MED ORDER — SITAGLIP PHOS-METFORMIN HCL ER 100-1000 MG PO TB24: 1.0000 | ORAL_TABLET | Freq: Every day | ORAL | 3 refills | Status: DC

## 2018-08-09 MED ORDER — ZOLPIDEM TARTRATE 5 MG PO TABS: 5.0000 mg | ORAL_TABLET | Freq: Every day | ORAL | 1 refills | Status: DC

## 2018-08-09 MED ORDER — HYDROCHLOROTHIAZIDE 12.5 MG PO TABS: 12.5000 mg | ORAL_TABLET | Freq: Every day | ORAL | 1 refills | Status: DC

## 2018-08-09 MED ORDER — CYANOCOBALAMIN 1000 MCG/ML IJ SOLN
1000.0000 ug | Freq: Once | INTRAMUSCULAR | Status: AC
  Administered 2018-08-09: 1000 ug via INTRAMUSCULAR

## 2018-08-09 MED ORDER — GLYBURIDE 2.5 MG PO TABS: 2.5000 mg | ORAL_TABLET | Freq: Every day | ORAL | 1 refills | Status: DC

## 2018-08-09 NOTE — Patient Instructions (Addendum)
Refilled all meds to day.  Increased metoprolol to 50 mg a day- new pill will be 50 mg a day.  Foot exam next visit when it is warmer.  You can schedule monthly b12 injections by nurse visit.   a1c is 6.6 today. That is great. If you start to see fasting sugars below 80- do not take the glyburide.   Please schedule to followup with your hematologist- especially with new rash and weakness etc.   F/u 4 months.   Please help [place] help you:  We are honored you have chosen [facility] for your Primary Care home. Below you will find basic instructions that you may need to access in the future. Please help [place] help you by reading the instructions, which cover many of the frequent questions we experience.   Prescription refills and request:  -In order to allow more efficient response time, please call your pharmacy for all refills. They will forward the request electronically to [place]. This allows for the quickest possible response. Request left on a nurse line can take longer to refill, since these are checked as time allows between office patients and other phone calls.  - refill request can take up to 3-5 working days to complete.  - If request is sent electronically and request is appropiate, it is usually completed in 1-2 business days.  - all patients will need to be seen routinely for all chronic medical conditions requiring prescription medications (see follow-up below). If you are overdue for follow up on your condition, you will be asked to make an appointment and we will call in enough medication to cover you until your appointment (up to 30 days).  - all controlled substances will require a face to face visit to request/refill.  - if you desire your prescriptions to go through a new pharmacy, and have an active script at original pharmacy, you will need to call your pharmacy and have scripts transferred to new pharmacy. This is completed between the pharmacy locations and not by your  provider.    Results: If any images or labs were ordered, it can take up to 1 week to get results depending on the test ordered and the lab/facility running and resulting the test. - Normal or stable results, which do not need further discussion, may be released to your mychart immediately with attached note to you. A call may not be generated for normal results. Please make certain to sign up for mychart. If you have questions on how to activate your mychart you can call the front office.  - If your results need further discussion, our office will attempt to contact you via phone, and if unable to reach you after 2 attempts, we will release your abnormal result to your mychart with instructions.  - All results will be automatically released in mychart after 1 week.  - Your provider will provide you with explanation and instruction on all relevant material in your results. Please keep in mind, results and labs may appear confusing or abnormal to the untrained eye, but it does not mean they are actually abnormal for you personally. If you have any questions about your results that are not covered, or you desire more detailed explanation than what was provided, you should make an appointment with your provider to do so.   Our office handles many outgoing and incoming calls daily. If we have not contacted you within 1 week about your results, please check your mychart to see if  there is a message first and if not, then contact our office.  In helping with this matter, you help decrease call volume, and therefore allow [place] to be able to respond to patients needs more efficiently.   Acute office visits (sick visit):  An acute visit is intended for a new problem and are scheduled in shorter time slots to allow schedule openings for patients with new problems. This is the appropriate visit to discuss a new problem. Problems will not be addressed by phone call or Echart message. Appointment is needed if  requesting treatment. In order to provide you with excellent quality medical care with proper time for you to explain your problem, have an exam and receive treatment with instructions, these appointments should be limited to one new problem per visit. If you experience a new problem, in which you desire to be addressed, please make an acute office visit, we save openings on the schedule to accommodate you. Please do not save your new problem for any other type of visit, let us take care of it properly and quickly for you.   Follow up visits:  Depending on your condition(s) your provider will need to see you routinely in order to provide you with quality care and prescribe medication(s). Most chronic conditions (Example: hypertension, Diabetes, depression/anxiety... etc), require visits a couple times a year. Your provider will instruct you on proper follow up for your personal medical conditions and history. Please make certain to make follow up appointments for your condition as instructed. Failing to do so could result in lapse in your medication treatment/refills. If you request a refill, and are overdue to be seen on a condition, we will always provide you with a 30 day script (once) to allow you time to schedule.    Medicare wellness (well visit): - we have a wonderful Nurse Maudie Mercury), that will meet with you and provide you will yearly medicare wellness visits. These visits should occur yearly (can not be scheduled less than 1 calendar year apart) and cover preventive health, immunizations, advance directives and screenings you are entitled to yearly through your medicare benefits. Do not miss out on your entitled benefits, this is when medicare will pay for these benefits to be ordered for you.  These are strongly encouraged by your provider and is the appropriate type of visit to make certain you are up to date with all preventive health benefits. If you have not had your medicare wellness exam in the  last 12 months, please make certain to schedule one by calling the office and schedule your medicare wellness with Maudie Mercury as soon as possible.   Yearly physical (well visit):  - Adults are recommended to be seen yearly for physicals. Check with your insurance and date of your last physical, most insurances require one calendar year between physicals. Physicals include all preventive health topics, screenings, medical exam and labs that are appropriate for gender/age and history. You may have fasting labs needed at this visit. This is a well visit (not a sick visit), new problems should not be covered during this visit (see acute visit).  - Pediatric patients are seen more frequently when they are younger. Your provider will advise you on well child visit timing that is appropriate for your their age. - This is not a medicare wellness visit. Medicare wellness exams do not have an exam portion to the visit. Some medicare companies allow for a physical, some do not allow a yearly physical. If your medicare allows  a yearly physical you can schedule the medicare wellness with our nurse Maudie Mercury and have your physical with your provider after, on the same day. Please check with insurance for your full benefits.   Late Policy/No Shows:  - all new patients should arrive 15-30 minutes earlier than appointment to allow [place] time  to  obtain all personal demographics,  insurance information and for you to complete office paperwork. - All established patients should arrive 10-15 minutes earlier than appointment time to update all information and be checked in .  - In our best efforts to run on time, if you are late for your appointment you will be asked to either reschedule or if able, we will work you back into the schedule. There will be a wait time to work you back in the schedule,  depending on availability.  - If you are unable to make it to your appointment as scheduled, please call 24 hours ahead of time to allow [place] to  fill the time slot with someone else who needs to be seen. If you do not cancel your appointment ahead of time, you may be charged a no show fee.

## 2018-08-09 NOTE — Progress Notes (Signed)
Patient ID: Kathleen Gibson, female  DOB: [birth year], 68 y.o.   MRN: [id number] Patient Care Team    Relationship Specialty Notifications Start End  [name], DO PCP - General Family Medicine  11/08/17   Doran Stabler, MD Consulting Physician Gastroenterology  04/06/17   Truitt Merle, MD Consulting Physician Hematology  04/06/17     Chief Complaint  Patient presents with  . Diabetes    Sugars have been running high x2 weeks. Pt thinks it is the medication     Subjective:  Kathleen Gibson is a 68 y.o.  female present for [facility] follow up.  Diabetes: Pt reports compliance with janumet.  Blood sugars have not been routinely checked. Patient denies dizziness, hyperglycemic or hypoglycemic events. Patient denies numbness, tingling in the extremities or nonhealing wounds of feet.  PNA series: PNA23 06/2017, repeat prevnar 06/2018 Flu shot: Patient declined (recommneded yearly) BMP: 11/20/2017 GFR 55 Foot exam: 06/15/2017 Eye exam: Patient reports her last eye exam was October 2017. She knows she is overdue and will try to make an appt . A1c: 8..6--> 8.3--> 7.9--> 7.2--> 7.3 --> 6.6 today  Hypertension/hypertriglyceridemia Pt reports compliance with toprol xl 25 mg qd and HCTZ 12.5 mg QD. Patient denies chest pain, shortness of breath, dizziness or lower extremity edema.  Pt does not take a daily baby ASA (low platelets). Pt is not prescribed statin. BMP: see above CBC: 11/20/2017 WBC 10.4, hgb 11.4, plt 44 Lipids: 11/07/2017 good with the exception of tg 171 Tsh: 07/2017 WNL Diet: Does not watch routinely Exercise: Does not exercise routinely RF: Hypertension, diabetes, hyperlipidemia, obesity  INSOMNIA, CHRONIC/RLS Ambien 5 mg not working as well as 10 mg. Gabapentin 600-900 mg QHS is working ok for RLS, but not always.   Chronic ITP (idiopathic thrombocytopenia) (HCC) Labs 11/07/2017 plts dropped to 34. She saw her heme in June and plt increased to 44. [facility]EME encouraged her to follow up  3 mos with them and have monthly CBC. Per their note she would be managed clinically as long as remained above 30K. Otherwise she would need referred to surgery for splenectomy. She denies increased bleeding or bruising today.  b12 deficiency/pernicious anemia/myalgia/weakness:  Had been on B12 injections in the past, does feel better on B12 injections.   Depression screen [facility] 2/9 04/11/2018 10/25/2017 04/06/2017 08/27/2014  Decreased Interest 0 0 0 0  Down, Depressed, Hopeless - 0 0 0  PHQ - 2 Score 0 0 0 0   No flowsheet data found.     Fall Risk  04/11/2018 04/06/2017  Falls in the past year? No No    Immunization History  Administered Date(s) Administered  . Pneumococcal Conjugate-13 03/19/2015  . Pneumococcal Polysaccharide-23 06/03/2008, 07/11/2017  . Td 06/13/2006    No exam data present  Past Medical History:  Diagnosis Date  . Diabetes ([place])   . HLD (hyperlipidemia)   . Hypertension   . Pernicious anemia    Allergies  Allergen Reactions  . Asa [Aspirin] Other (See Comments)    Contraindicated d/t low plts  . Nsaids Other (See Comments)    Contraindicated d/t low plts   Past Surgical History:  Procedure Laterality Date  . ABDOMINAL HERNIA REPAIR  2009  . ABDOMINOPLASTY    . [place]  . GASTRIC BYPASS  2000  . hemorroid surgery  2007  . TONSILLECTOMY AND ADENOIDECTOMY  1957   Family History  Problem Relation Age of Onset  .  Cancer Mother 22       unknown type cancer   . Hypertension Mother   . Hypertension Father   . Diabetes Sister   . Leukemia Brother   . Colon cancer Brother 35   Social History   Socioeconomic History  . Marital status: Divorced    Spouse name: Not on file  . Number of children: 3  . Years of education: 51  . Highest education level: Not on file  Occupational History  . Occupation: Retired  Scientific laboratory technician  . Financial resource strain: Not on file  . Food insecurity:    Worry: Not on file    Inability: Not  on file  . Transportation needs:    Medical: Not on file    Non-medical: Not on file  Tobacco Use  . Smoking status: Never Smoker  . Smokeless tobacco: Never Used  Substance and Sexual Activity  . Alcohol use: No  . Drug use: No  . Sexual activity: Never  Lifestyle  . Physical activity:    Days per week: Not on file    Minutes per session: Not on file  . Stress: Not on file  Relationships  . Social connections:    Talks on phone: Not on file    Gets together: Not on file    Attends religious service: Not on file    Active member of club or organization: Not on file    Attends meetings of clubs or organizations: Not on file    Relationship status: Not on file  . Intimate partner violence:    Fear of current or ex partner: Not on file    Emotionally abused: Not on file    Physically abused: Not on file    Forced sexual activity: Not on file  Other Topics Concern  . Not on file  Social History Narrative   Divorced. Retired Pension scheme manager.   12th grade education.   Drinks caffeine.   Smoke alarm in the home. Wears her seatbelt.   Feels safe in her relationships.   Allergies as of 08/09/2018      Reactions   Asa [aspirin] Other (See Comments)   Contraindicated d/t low plts   Nsaids Other (See Comments)   Contraindicated d/t low plts      Medication List       Accurate as of August 09, 2018 10:14 AM. Always use your most recent med list.        ferrous sulfate 325 (65 FE) MG tablet Take 1 tablet (325 mg total) by mouth 2 (two) times daily with a meal.   Fish Oil 1000 MG Caps Take 1,000 mg by mouth daily.   gabapentin 300 MG capsule Commonly known as:  NEURONTIN Take 2-3 capsules (600-900 mg total) by mouth at bedtime.   glucose monitoring kit monitoring kit 1 each by Does not apply route as needed for other. Use as directed   glyBURIDE 2.5 MG tablet Commonly known as:  DIABETA Take 1 tablet (2.5 mg total) by mouth daily with breakfast.     hydrochlorothiazide 12.5 MG tablet Commonly known as:  HYDRODIURIL Take 1 tablet (12.5 mg total) by mouth daily.   metoprolol succinate 50 MG 24 hr tablet Commonly known as:  TOPROL-XL Take 1 tablet (50 mg total) by mouth daily.   SitaGLIPtin-MetFORMIN HCl [phone] MG Tb24 Take 1 tablet by mouth at bedtime.   zolpidem 5 MG tablet Commonly known as:  AMBIEN Take 1 tablet (5 mg total) by mouth  at bedtime.       All past medical history, surgical history, allergies, family history, immunizations andmedications were updated in the EMR today and reviewed under the history and medication portions of their EMR.    Recent Results (from the past 2160 hour(s))  Comprehensive metabolic panel     Status: Abnormal   Collection Time: 07/24/18  7:06 AM  Result Value Ref Range   Sodium 132 (L) 135 - 145 mmol/L   Potassium 3.4 (L) 3.5 - 5.1 mmol/L   Chloride 99 98 - 111 mmol/L   CO2 22 22 - 32 mmol/L   Glucose, Bld 162 (H) 70 - 99 mg/dL   BUN 19 8 - 23 mg/dL   Creatinine, Ser 0.99 0.44 - 1.00 mg/dL   Calcium 8.4 (L) 8.9 - 10.3 mg/dL   Total Protein 8.0 6.5 - 8.1 g/dL   Albumin 4.2 3.5 - 5.0 g/dL   AST 19 15 - 41 U/L   ALT 16 0 - 44 U/L   Alkaline Phosphatase 55 38 - 126 U/L   Total Bilirubin 1.3 (H) 0.3 - 1.2 mg/dL   GFR calc non Af Amer 59 (L) >60 mL/min   GFR calc Af Amer >60 >60 mL/min   Anion gap 11 5 - 15    Comment: Performed at [facility], [place] [address] [place], [place] [address]  Lipase, blood     Status: None   Collection Time: 07/24/18  7:06 AM  Result Value Ref Range   Lipase 25 11 - 51 U/L    Comment: Performed at [facility], [place] [address], [place], [place] [address]  Troponin I - Once     Status: None   Collection Time: 07/24/18  7:06 AM  Result Value Ref Range   Troponin I <0.03 <0.03 ng/mL    Comment: Performed at [facility], [place] [address], [place], [place] [address]  CBC with Differential      Status: Abnormal   Collection Time: 07/24/18  7:06 AM  Result Value Ref Range   WBC 13.0 (H) 4.0 - 10.5 K/uL   RBC 4.08 3.87 - 5.11 MIL/uL   Hemoglobin 11.9 (L) 12.0 - 15.0 g/dL   HCT 37.3 36.0 - 46.0 %   MCV 91.4 80.0 - 100.0 fL   MCH 29.2 26.0 - 34.0 pg   MCHC 31.9 30.0 - 36.0 g/dL   RDW 13.2 11.5 - 15.5 %   Platelets 86 (L) 150 - 400 K/uL    Comment: REPEATED TO VERIFY PLATELET COUNT CONFIRMED BY SMEAR SPECIMEN CHECKED FOR CLOTS Immature Platelet Fraction may be clinically indicated, consider ordering this additional test [id number]    nRBC 0.0 0.0 - 0.2 %   Neutrophils Relative % 77 %   Neutro Abs 9.9 (H) 1.7 - 7.7 K/uL   Lymphocytes Relative 7 %   Lymphs Abs 0.9 0.7 - 4.0 K/uL   Monocytes Relative 14 %   Monocytes Absolute 1.9 (H) 0.1 - 1.0 K/uL   Eosinophils Relative 0 %   Eosinophils Absolute 0.0 0.0 - 0.5 K/uL   Basophils Relative 0 %   Basophils Absolute 0.0 0.0 - 0.1 K/uL   Immature Granulocytes 2 %   Abs Immature Granulocytes 0.30 (H) 0.00 - 0.07 K/uL    Comment: Performed at [facility], [place] [address], [place], [place] [address]  Lactic acid, plasma     Status: None   Collection Time: 07/24/18  7:06 AM  Result  Value Ref Range   Lactic Acid, Venous 1.0 0.5 - 1.9 mmol/L    Comment: Performed at [facility], [place] [address] [place], [place] [address]  Sedimentation rate     Status: Abnormal   Collection Time: 07/24/18  7:06 AM  Result Value Ref Range   Sed Rate 32 (H) 0 - 22 mm/hr    Comment: Performed at [facility], [place] [address] [place], [place] [address]  Rheumatoid factor     Status: None   Collection Time: 07/24/18  7:06 AM  Result Value Ref Range   Rhuematoid fact SerPl-aCnc 11.2 0.0 - 13.9 IU/mL    Comment: (NOTE) Performed At: [facility] [place], [place] [id number] Rush Farmer MD YK:[id number]   Lupus anticoagulant panel     Status: None   Collection Time:  07/24/18  7:06 AM  Result Value Ref Range   PTT Lupus Anticoagulant 32.6 0.0 - 51.9 sec   DRVVT 42.0 0.0 - 47.0 sec   Lupus Anticoag Interp Comment:     Comment: (NOTE) No lupus anticoagulant was detected. Performed At: [facility] [place], [place] [id number] Rush Farmer MD ZR:[id number]   Antinuclear Antibodies, IFA     Status: None   Collection Time: 07/24/18  7:06 AM  Result Value Ref Range   ANA Ab, IFA Negative     Comment: (NOTE)                                     Negative   <1:80                                     Borderline  1:80                                     Positive   >1:80 Performed At: [facility] [address] [place], [place] [id number] Rush Farmer MD LH:[id number]   Lyme disease dna by pcr(borrelia burg)     Status: None   Collection Time: 07/24/18  7:07 AM  Result Value Ref Range   Lyme Disease(B.burgdorferi)PCR Negative Negative    Comment: (NOTE) No B. burgdorferi DNA Detected. This test was developed and its performance characteristics determined by [facility].  It has not been cleared or approved by the Food and Drug Administration.  The FDA has determined that such clearance or approval is not necessary. Performed At: [facility] [place], [place] [id number] Rush Farmer MD HR:[id number]    Specimen Source-LYPCR URINE, CLEAN CATCH     Comment: Performed at [place] [address], [place], [place] [address]  Urinalysis, Routine w reflex microscopic     Status: Abnormal   Collection Time: 07/24/18  7:07 AM  Result Value Ref Range   Color, Urine YELLOW YELLOW   APPearance CLEAR CLEAR   Specific Gravity, Urine 1.021 1.005 - 1.030   pH 5.0 5.0 - 8.0   Glucose, UA NEGATIVE NEGATIVE mg/dL   Hgb urine dipstick SMALL (A) NEGATIVE   Bilirubin Urine NEGATIVE NEGATIVE   Ketones, ur 20 (A) NEGATIVE mg/dL   Protein, ur NEGATIVE NEGATIVE mg/dL   Nitrite NEGATIVE  NEGATIVE   Leukocytes, UA NEGATIVE NEGATIVE  RBC / HPF 0-5 0 - 5 RBC/hpf   WBC, UA 0-5 0 - 5 WBC/hpf   Bacteria, UA RARE (A) NONE SEEN   Squamous Epithelial / LPF 0-5 0 - 5   Mucus PRESENT     Comment: Performed at [facility], [place] [address], [place], [place] [address]  Influenza panel by PCR (type A & B)     Status: None   Collection Time: 07/24/18  7:10 AM  Result Value Ref Range   Influenza A By PCR NEGATIVE NEGATIVE   Influenza B By PCR NEGATIVE NEGATIVE    Comment: (NOTE) The Xpert Xpress Flu assay is intended as an aid in the diagnosis of  influenza and should not be used as a sole basis for treatment.  This  assay is FDA approved for nasopharyngeal swab specimens only. Nasal  washings and aspirates are unacceptable for Xpert Xpress Flu testing. Performed at [facility], [place] [address], [place], [place] [address]   CBG monitoring, ED     Status: Abnormal   Collection Time: 07/24/18  7:31 AM  Result Value Ref Range   Glucose-Capillary 146 (H) 70 - 99 mg/dL  TSH     Status: None   Collection Time: 07/26/18 10:56 AM  Result Value Ref Range   TSH 1.76 0.35 - 4.50 uIU/mL  Comp Met (CMET)     Status: Abnormal   Collection Time: 07/26/18 10:56 AM  Result Value Ref Range   Sodium 139 135 - 145 mEq/L   Potassium 4.2 3.5 - 5.1 mEq/L   Chloride 103 96 - 112 mEq/L   CO2 29 19 - 32 mEq/L   Glucose, Bld 220 (H) 70 - 99 mg/dL   BUN 20 6 - 23 mg/dL   Creatinine, Ser 0.87 0.40 - 1.20 mg/dL   Total Bilirubin 0.5 0.2 - 1.2 mg/dL   Alkaline Phosphatase 48 39 - 117 U/L   AST 13 0 - 37 U/L   ALT 11 0 - 35 U/L   Total Protein 7.3 6.0 - 8.3 g/dL   Albumin 4.0 3.5 - 5.2 g/dL   Calcium 8.7 8.4 - 10.5 mg/dL   GFR 64.96 >60.00 mL/min  B12     Status: None   Collection Time: 07/26/18 10:56 AM  Result Value Ref Range   Vitamin B-12 402 211 - 911 pg/mL  CK (Creatine Kinase)     Status: None   Collection Time: 07/26/18 10:56 AM  Result Value Ref  Range   Total CK 33 7 - 177 U/L  POCT glycosylated hemoglobin (Hb A1C)     Status: Abnormal   Collection Time: 08/09/18  9:37 AM  Result Value Ref Range   Hemoglobin A1C 6.6 (A) 4.0 - 5.6 %   HbA1c POC (<> result, manual entry) 6.6 4.0 - 5.6 %   HbA1c, POC (prediabetic range) 6.6 (A) 5.7 - 6.4 %   HbA1c, POC (controlled diabetic range) 6.6 0.0 - 7.0 %     ROS: 14 pt review of systems performed and negative (unless mentioned in an HPI)  Objective: BP 116/75 (BP Location: Left Arm, Patient Position: Sitting, Cuff Size: Normal)   Pulse 91   Temp 97.7 F (36.5 C) (Oral)   Resp 17   Ht '5\' 3"'$  (1.6 m)   Wt 191 lb 2 oz (86.7 kg)   SpO2 98%   BMI 33.86 kg/m  Gen: Afebrile. No acute distress. Nontoxic, pleasant caucasian female. Obese.  HENT: AT. [place]. Bilateral TM  visualized and normal in appearance. MMM. Bilateral nares WNL. Throat without erythema or exudates. No cough.  Eyes:Pupils Equal Round Reactive to light, Extraocular movements intact,  Conjunctiva without redness, discharge or icterus. Neck/lymp/endocrine: Supple,no lymphadenopathy, no thyromegaly CV: RRR no murmur, no edema, +2/4 P posterior tibialis pulses Chest: CTAB, no wheeze or crackles Abd: Soft. NTND. BS present Skin: bilateral lower ext nonblanchable papular rash, no petechiae.  Neuro: Normal gait. PERLA. EOMi. Alert. Oriented x3 Psych: Normal affect, dress and demeanor. Normal speech. Normal thought content and judgment.  Results for orders placed or performed in visit on 08/09/18 (from the past 48 hour(s))  POCT glycosylated hemoglobin (Hb A1C)     Status: Abnormal   Collection Time: 08/09/18  9:37 AM  Result Value Ref Range   Hemoglobin A1C 6.6 (A) 4.0 - 5.6 %   HbA1c POC (<> result, manual entry) 6.6 4.0 - 5.6 %   HbA1c, POC (prediabetic range) 6.6 (A) 5.7 - 6.4 %   HbA1c, POC (controlled diabetic range) 6.6 0.0 - 7.0 %     Assessment/plan: Kathleen Gibson is a 68 y.o. female present for establishment of care  (from [place]) and chronic medical conditions. Essential hypertension/hyperlipidemia/tachycardia/obesity - Stable. HR still elevated--> increase metoprolol to 50 mg QD - Low-sodium diet, exercise. 150 minutes a week - ASA contraindicated with thrombocytopenia.  - continue fish oil. Statin declined - F/U 4 mos  Diabetic polyneuropathy associated with type 2 diabetes mellitus ([place]) type 2 diabetes mellitus ([place]) - doing well. Has had higher readings recently 2/2 to steroid use.  - continue to decrease carb and sugar. Increase exercise.  - continue Janumet [phone] mg QD. Refills provided today - Continue glyburide 2.5 mg - hold if fasting sugar less than 80 - Continue gabapentin (doses below) - statin declined PNA series: PNA23 06/2017, Prevnar 2013 Flu shot: Patient declined (recommneded yearly) BMP: 11/20/2017 GFR 55 Foot exam: 06/15/2017--> complete next visit in summer Eye exam: Patient reports her last eye exam was October 2017. She knows she is overdue and will try to make an appt . A1c: 8..6--> 8.3--> 7.9--> 7.2--> 7.3--> 6.6 today - F/U 4 mos.   INSOMNIA, CHRONIC/RLS - She reports Ambien 5 mg QD is not as effective as the 10 mg QD.  Decreased dose to 5 mg for safety profile age/female.  - Continue gabapentin 600-900 qhs - taking iron daily, last panel normal 10/2017.  - tried trazodone- not effective.  - ambien refilled today. NCCS database reviewed 08/09/18   Thrombocytopenia (HCC)/ITP/rash/weakness  She did follow with heme in June after PLT dropped. She has not followed up since then--> strongly encouraged her to do so, especially with new rash and symptoms that sent her to the ED for weakness/myalgia. She reports understanding and states she knows she needs to follow up with her and will do so.    B12 deficiency/pernicious anemia: - Restart  q 4 week  b12 injections by nurse visit x12.   Marland KitchenReturn in about 4 months (around 12/08/2018) for [facility].   Note is dictated utilizing  voice recognition software. Although note has been proof read prior to signing, occasional typographical errors still can be missed. If any questions arise, please do not hesitate to call for verification.  Electronically signed by: Howard Pouch, DO [place]

## 2018-08-13 ENCOUNTER — Encounter: Payer: Self-pay | Admitting: Family Medicine

## 2018-09-07 ENCOUNTER — Ambulatory Visit: Payer: Medicare HMO

## 2018-09-24 ENCOUNTER — Encounter: Payer: Self-pay | Admitting: Family Medicine

## 2018-10-09 ENCOUNTER — Other Ambulatory Visit: Payer: Self-pay | Admitting: Family Medicine

## 2018-10-15 ENCOUNTER — Telehealth: Payer: Self-pay | Admitting: Family Medicine

## 2018-10-15 NOTE — Telephone Encounter (Signed)
Tried to begin PA but [email or web address] was down and was unable to finish. Will try again.

## 2018-10-15 NOTE — Telephone Encounter (Signed)
[employer] and PA was approved. Pt was notified to contact pharmacy.

## 2018-10-15 NOTE — Telephone Encounter (Signed)
Copied from [place] [id number]. Topic: General - Other >> Oct 15, 2018 11:44 AM Percell Belt A wrote: Reason for CRM: pt called in , unable to reach office.  Pt stated that the pharmacy is needing a PA on her zolpidem (AMBIEN) 5 MG tablet [[id number]] . Pt is going out of town this weekend and is hoping to get it before she leaves   [facility] [place] [place], [place] [address] [phone] (Phone)

## 2018-12-10 ENCOUNTER — Encounter: Payer: Self-pay | Admitting: Family Medicine

## 2018-12-10 ENCOUNTER — Ambulatory Visit (INDEPENDENT_AMBULATORY_CARE_PROVIDER_SITE_OTHER): Payer: Medicare HMO | Admitting: Family Medicine

## 2018-12-10 ENCOUNTER — Other Ambulatory Visit: Payer: Self-pay

## 2018-12-10 VITALS — BP 122/79 | HR 79 | Temp 97.6°F | Resp 17 | Ht 63.0 in | Wt 187.0 lb

## 2018-12-10 DIAGNOSIS — E538 Deficiency of other specified B group vitamins: Secondary | ICD-10-CM

## 2018-12-10 DIAGNOSIS — M2550 Pain in unspecified joint: Secondary | ICD-10-CM | POA: Diagnosis not present

## 2018-12-10 DIAGNOSIS — R202 Paresthesia of skin: Secondary | ICD-10-CM | POA: Diagnosis not present

## 2018-12-10 DIAGNOSIS — E1142 Type 2 diabetes mellitus with diabetic polyneuropathy: Secondary | ICD-10-CM

## 2018-12-10 DIAGNOSIS — E1140 Type 2 diabetes mellitus with diabetic neuropathy, unspecified: Secondary | ICD-10-CM | POA: Diagnosis not present

## 2018-12-10 DIAGNOSIS — R531 Weakness: Secondary | ICD-10-CM | POA: Diagnosis not present

## 2018-12-10 DIAGNOSIS — G609 Hereditary and idiopathic neuropathy, unspecified: Secondary | ICD-10-CM

## 2018-12-10 DIAGNOSIS — I10 Essential (primary) hypertension: Secondary | ICD-10-CM | POA: Diagnosis not present

## 2018-12-10 DIAGNOSIS — D693 Immune thrombocytopenic purpura: Secondary | ICD-10-CM | POA: Diagnosis not present

## 2018-12-10 DIAGNOSIS — D510 Vitamin B12 deficiency anemia due to intrinsic factor deficiency: Secondary | ICD-10-CM | POA: Diagnosis not present

## 2018-12-10 DIAGNOSIS — M791 Myalgia, unspecified site: Secondary | ICD-10-CM

## 2018-12-10 DIAGNOSIS — E611 Iron deficiency: Secondary | ICD-10-CM

## 2018-12-10 DIAGNOSIS — E785 Hyperlipidemia, unspecified: Secondary | ICD-10-CM | POA: Diagnosis not present

## 2018-12-10 DIAGNOSIS — Z5320 Procedure and treatment not carried out because of patient's decision for unspecified reasons: Secondary | ICD-10-CM

## 2018-12-10 LAB — VITAMIN D 25 HYDROXY (VIT D DEFICIENCY, FRACTURES): VITD: 30.22 ng/mL (ref 30.00–100.00)

## 2018-12-10 LAB — POCT GLYCOSYLATED HEMOGLOBIN (HGB A1C)
HbA1c POC (<> result, manual entry): 6.9 % (ref 4.0–5.6)
HbA1c, POC (controlled diabetic range): 6.9 % (ref 0.0–7.0)
HbA1c, POC (prediabetic range): 6.9 % — AB (ref 5.7–6.4)
Hemoglobin A1C: 6.9 % — AB (ref 4.0–5.6)

## 2018-12-10 MED ORDER — HYDROCHLOROTHIAZIDE 12.5 MG PO TABS: 12.5000 mg | ORAL_TABLET | Freq: Every day | ORAL | 1 refills | Status: DC

## 2018-12-10 MED ORDER — METOPROLOL SUCCINATE ER 50 MG PO TB24: 50.0000 mg | ORAL_TABLET | Freq: Every day | ORAL | 1 refills | Status: DC

## 2018-12-10 MED ORDER — GLYBURIDE 2.5 MG PO TABS: 2.5000 mg | ORAL_TABLET | Freq: Every day | ORAL | 1 refills | Status: DC

## 2018-12-10 MED ORDER — ZOLPIDEM TARTRATE 5 MG PO TABS: 5.0000 mg | ORAL_TABLET | Freq: Every day | ORAL | 1 refills | Status: DC

## 2018-12-10 MED ORDER — GABAPENTIN 300 MG PO CAPS: ORAL_CAPSULE | ORAL | 1 refills | Status: DC

## 2018-12-10 MED ORDER — CYANOCOBALAMIN 1000 MCG/ML IJ SOLN
1000.0000 ug | Freq: Once | INTRAMUSCULAR | Status: AC
  Administered 2018-12-10: 1000 ug via INTRAMUSCULAR

## 2018-12-10 NOTE — Progress Notes (Signed)
Patient ID: Kathleen Gibson, female  DOB: May 16, 1951, 68 y.o.   MRN: [id number] Patient Care Team    Relationship Specialty Notifications Start End  [name], DO PCP - General Family Medicine  11/08/17   Doran Stabler, MD Consulting Physician Gastroenterology  04/06/17   Truitt Merle, MD Consulting Physician Hematology  04/06/17     Chief Complaint  Patient presents with   Diabetes    Pt complains of still having leg pain at night when going to sleep     Subjective:  Kathleen Gibson is a 68 y.o.  female present for [facility] follow up.  Diabetes: Pt reports compliance with janumet and glipizide 2.5 mg.  Blood sugars have not been routinely checked. Patient denies chest pain, shortness of breath, dizziness or lower extremity edema.  PNA series: PNA23 06/2017, repeat prevnar 06/2018 Flu shot: Patient declined (recommneded yearly) BMP: 07/2018 GFR 55 Foot exam: 06/15/2017 Eye exam: Patient reports her last eye exam was October 2017. She knows she is overdue and will try to make an appt. A1c: 8..6--> 8.3--> 7.9--> 7.2--> 7.3 --> 6.6 >>> 6.9 today  Hypertension/hypertriglyceridemia Pt reports compliance with toprol xl 50 mg qd and HCTZ 12.5 mg QD. Patient denies chest pain, shortness of breath, dizziness or lower extremity edema.  Pt does not take a daily baby ASA (low platelets). Pt is not prescribed statin. BMP: 08/02/18 CBC: 07/2018 plt 86 Lipids: 11/07/2017 good with the exception of tg 171 Tsh: 07/2018 WNL Diet: Does not watch routinely Exercise: Does not exercise routinely RF: Hypertension, diabetes, hyperlipidemia, obesity  INSOMNIA, CHRONIC/RLS Ambien 5 mg not working as well as 10 mg. Gabapentin-900 mg QHS is working ok for RLS, but not always. Insomnia is about the same.   Chronic ITP (idiopathic thrombocytopenia) (HCC) Labs 11/07/2017 plts dropped to 34. She saw her heme in June and plt increased to 44. HEME encouraged her to follow up 3 mos with them and have monthly CBC. Per  their note she would be managed clinically as long as remained above 30K. Otherwise she would need referred to surgery for splenectomy. She denies increased bleeding or bruising today. The rash she had last appt is resolved.   b12 deficiency/pernicious anemia/myalgia/weakness:  Had been on B12 injections in the past, does feel better on B12 injections. Only received one since last visit 2/2 to pandemic. She feels her legs are worse than last visit. She states they are awful at night and walking hurts. Pain is in her lateral hips, but does not feel like bone pain. Pain radiates to her ankles and is also in her arms and hands. Discomfort is described as pain and paraesthesia type discomfort. Increasing gabapentin night dose was not helpful. She states since her ED visit earlier this year her legs and arms have continue to cause discomfort.    Depression screen [facility] 2/9 04/11/2018 10/25/2017 04/06/2017 08/27/2014  Decreased Interest 0 0 0 0  Down, Depressed, Hopeless - 0 0 0  PHQ - 2 Score 0 0 0 0   No flowsheet data found.     Fall Risk  04/11/2018 04/06/2017  Falls in the past year? No No    Immunization History  Administered Date(s) Administered   Pneumococcal Conjugate-13 03/19/2015   Pneumococcal Polysaccharide-23 06/03/2008, 07/11/2017   Td 06/13/2006    No exam data present  Past Medical History:  Diagnosis Date   Diabetes ([place])    HLD (hyperlipidemia)    Hypertension  Pernicious anemia    Allergies  Allergen Reactions   Asa [Aspirin] Other (See Comments)    Contraindicated d/t low plts   Nsaids Other (See Comments)    Contraindicated d/t low plts   Past Surgical History:  Procedure Laterality Date   ABDOMINAL HERNIA REPAIR  2009   ABDOMINOPLASTY     CESAREAN SECTION  1978   GASTRIC BYPASS  2000   hemorroid surgery  2007   TONSILLECTOMY AND ADENOIDECTOMY  1957   Family History  Problem Relation Age of Onset   Cancer Mother 75       unknown type  cancer    Hypertension Mother    Hypertension Father    Diabetes Sister    Leukemia Brother    Colon cancer Brother 83   Social History   Socioeconomic History   Marital status: Divorced    Spouse name: Not on file   Number of children: 3   Years of education: 12   Highest education level: Not on file  Occupational History   Occupation: Retired  Scientist, product/process development strain: Not on file   Food insecurity    Worry: Not on file    Inability: Not on Lexicographer needs    Medical: Not on file    Non-medical: Not on file  Tobacco Use   Smoking status: Never Smoker   Smokeless tobacco: Never Used  Substance and Sexual Activity   Alcohol use: No   Drug use: No   Sexual activity: Never  Lifestyle   Physical activity    Days per week: Not on file    Minutes per session: Not on file   Stress: Not on file  Relationships   Social connections    Talks on phone: Not on file    Gets together: Not on file    Attends religious service: Not on file    Active member of club or organization: Not on file    Attends meetings of clubs or organizations: Not on file    Relationship status: Not on file   Intimate partner violence    Fear of current or ex partner: Not on file    Emotionally abused: Not on file    Physically abused: Not on file    Forced sexual activity: Not on file  Other Topics Concern   Not on file  Social History Narrative   Divorced. Retired Pension scheme manager.   12th grade education.   Drinks caffeine.   Smoke alarm in the home. Wears her seatbelt.   Feels safe in her relationships.   Allergies as of 12/10/2018      Reactions   Asa [aspirin] Other (See Comments)   Contraindicated d/t low plts   Nsaids Other (See Comments)   Contraindicated d/t low plts      Medication List       Accurate as of December 10, 2018  9:50 AM. If you have any questions, ask your nurse or doctor.        ferrous sulfate 325 (65 FE)  MG tablet Take 1 tablet (325 mg total) by mouth 2 (two) times daily with a meal. What changed: when to take this   Fish Oil 1000 MG Caps Take 1,000 mg by mouth daily.   gabapentin 300 MG capsule Commonly known as: NEURONTIN 900 my QHS and 300 mg every 8 hrs in day What changed:   how much to take  how to take this  when to  take this  additional instructions Changed by: Howard Pouch, DO   glucose monitoring kit monitoring kit 1 each by Does not apply route as needed for other. Use as directed   glyBURIDE 2.5 MG tablet Commonly known as: DIABETA Take 1 tablet (2.5 mg total) by mouth daily with breakfast.   hydrochlorothiazide 12.5 MG tablet Commonly known as: HYDRODIURIL Take 1 tablet (12.5 mg total) by mouth daily.   metoprolol succinate 50 MG 24 hr tablet Commonly known as: TOPROL-XL Take 1 tablet (50 mg total) by mouth daily.   SitaGLIPtin-MetFORMIN HCl [phone] MG Tb24 Take 1 tablet by mouth at bedtime.   zolpidem 5 MG tablet Commonly known as: AMBIEN Take 1 tablet (5 mg total) by mouth at bedtime.       All past medical history, surgical history, allergies, family history, immunizations andmedications were updated in the EMR today and reviewed under the history and medication portions of their EMR.      ROS: 14 pt review of systems performed and negative (unless mentioned in an HPI)  Objective: BP 122/79 (BP Location: Left Arm, Patient Position: Sitting, Cuff Size: Normal)    Pulse 79    Temp 97.6 F (36.4 C) (Temporal)    Resp 17    Ht '5\' 3"'$  (1.6 m)    Wt 187 lb (84.8 kg)    SpO2 98%    BMI 33.13 kg/m  Gen: Afebrile. No acute distress. Nontoxic in presentation.  HENT: AT. [place].  MMM.  Eyes:Pupils Equal Round Reactive to light, Extraocular movements intact,  Conjunctiva without redness, discharge or icterus. Neck/lymp/endocrine: Supple,no lymphadenopathy, no thyromegaly CV: RRR no murmurn, no edema, +2/4 P posterior tibialis pulses Chest: CTAB, no wheeze  or crackles Abd: Soft. NTND. BS present. No Masses palpated.  Skin: no rashes, purpura or petechiae.  Neuro:  Normal gait. PERLA. EOMi. Alert. Oriented x3 Psych: Normal affect, dress and demeanor. Normal speech. Normal thought content and judgment.   Results for orders placed or performed in visit on 12/10/18 (from the past 48 hour(s))  POCT glycosylated hemoglobin (Hb A1C)     Status: Abnormal   Collection Time: 12/10/18  8:54 AM  Result Value Ref Range   Hemoglobin A1C 6.9 (A) 4.0 - 5.6 %   HbA1c POC (<> result, manual entry) 6.9 4.0 - 5.6 %   HbA1c, POC (prediabetic range) 6.9 (A) 5.7 - 6.4 %   HbA1c, POC (controlled diabetic range) 6.9 0.0 - 7.0 %    Assessment/plan: Kathleen Gibson is a 68 y.o. female present for establishment of care (from [place]) and chronic medical conditions. Essential hypertension/hyperlipidemia/tachycardia/obesity/statin declined - Stable. HR improved with increase in metoprolol.  - continue  metoprolol to 50 mg QD - Low-sodium diet, exercise. 150 minutes a week - ASA contraindicated with thrombocytopenia.  - continue fish oil. Statin declined - F/U 4 mos  Diabetic polyneuropathy associated with type 2 diabetes mellitus ([place]) type 2 diabetes mellitus ([place]) - mildly increased a1c, still well controlled. - continue to decrease carb and sugar. Increase exercise.  - continue Janumet [phone] mg QD. Refills provided today - Continue glyburide 2.5 mg - hold if fasting sugar less than 80 - Continue gabapentin (doses below) - statin declined PNA series: PNA23 06/2017, Prevnar 2013 Flu shot: Patient declined (recommneded yearly) BMP: 11/20/2017 GFR 55 Foot exam: 06/15/2017--> complete next visit in summer Eye exam: Patient reports her last eye exam was October 2017. She knows she is overdue and will try to make an appt . A1c:  8..6--> 8.3--> 7.9--> 7.2--> 7.3--> 6.6>> 6.9 yoday today - F/U 4 mos.   INSOMNIA, CHRONIC/RLS - She reports Ambien 5 mg QD is not as  effective as the 10 mg QD.  Decreased dose to 5 mg for safety profile age/female.  - Continue gabapentin 900 qhs, added gabapentin 300 mg q 8 in the day. - taking iron daily, last panel normal 10/2017.>> collected today - tried trazodone- not effective.  - ambien refilled today. [facility] database reviewed 12/10/18  Thrombocytopenia (HCC)/ITP  She did follow with heme in June 2019 after PLT dropped initially. She has not followed up since then--> strongly encouraged her to do so, especially with new rash and symptoms that sent her to the ED for weakness/myalgia. She reports understanding and states she knows she needs to follow up with her and will do so soon.   Weakness/mylagia/paresthesia: - could be related to iron or b12. Labs to date have been normal.  - ANA normal.  - CK normal - TSH NL - ESR 32 - b12 400 > started injections - she is resistant to following with hem/onc. Discussed today with her in detail, there is a possibility the cause of her TTP and  prior + SPEP for light chains could potentially caused by same process.  - will collected vit d, PTH and calcium as well today.  - refer to neuro. For further eval.   B12 deficiency/pernicious anemia/IDA: - Restart  q 4 week  b12 injections by nurse visit x12. She only had one injection  2/2 to pandemic. - b12 IM administered today. Start q 4 weeks b12 injections by nurse injection.  - continue iron supplement. Iron level checked today.   .Return in about 4 months (around 04/11/2019). b12 injections q 4 weeks with nurse visit.   Note is dictated utilizing voice recognition software. Although note has been proof read prior to signing, occasional typographical errors still can be missed. If any questions arise, please do not hesitate to call for verification.  Electronically signed by: Howard Pouch, DO [place]

## 2018-12-10 NOTE — Patient Instructions (Addendum)
Gabapentin 900 mg before bed, and try 300 mg (1 tab) during day, if tolerating after 1 week>> start 300 mg every 8 hours in the day and 900 mg at night dose.    We will refer you to neuro to evaluate your paresthesia.  You can restart the B12 monthly- starting today.   Follow up in 4 months on diabetes. They will call you to schedule. I have refilled your medications.   It was great to see you today.    Please help [place] help you:  We are honored you have chosen [facility] for your Primary Care home. Below you will find basic instructions that you may need to access in the future. Please help [place] help you by reading the instructions, which cover many of the frequent questions we experience.   Prescription refills and request:  -In order to allow more efficient response time, please call your pharmacy for all refills. They will forward the request electronically to [place]. This allows for the quickest possible response. Request left on a nurse line can take longer to refill, since these are checked as time allows between office patients and other phone calls.  - refill request can take up to 3-5 working days to complete.  - If request is sent electronically and request is appropiate, it is usually completed in 1-2 business days.  - all patients will need to be seen routinely for all chronic medical conditions requiring prescription medications (see follow-up below). If you are overdue for follow up on your condition, you will be asked to make an appointment and we will call in enough medication to cover you until your appointment (up to 30 days).  - all controlled substances will require a face to face visit to request/refill.  - if you desire your prescriptions to go through a new pharmacy, and have an active script at original pharmacy, you will need to call your pharmacy and have scripts transferred to new pharmacy. This is completed between the pharmacy locations and not by your provider.     Results: If any images or labs were ordered, it can take up to 1 week to get results depending on the test ordered and the lab/facility running and resulting the test. - Normal or stable results, which do not need further discussion, may be released to your mychart immediately with attached note to you. A call may not be generated for normal results. Please make certain to sign up for mychart. If you have questions on how to activate your mychart you can call the front office.  - If your results need further discussion, our office will attempt to contact you via phone, and if unable to reach you after 2 attempts, we will release your abnormal result to your mychart with instructions.  - All results will be automatically released in mychart after 1 week.  - Your provider will provide you with explanation and instruction on all relevant material in your results. Please keep in mind, results and labs may appear confusing or abnormal to the untrained eye, but it does not mean they are actually abnormal for you personally. If you have any questions about your results that are not covered, or you desire more detailed explanation than what was provided, you should make an appointment with your provider to do so.   Our office handles many outgoing and incoming calls daily. If we have not contacted you within 1 week about your results, please check your mychart to see  if there is a message first and if not, then contact our office.  In helping with this matter, you help decrease call volume, and therefore allow [place] to be able to respond to patients needs more efficiently.   Acute office visits (sick visit):  An acute visit is intended for a new problem and are scheduled in shorter time slots to allow schedule openings for patients with new problems. This is the appropriate visit to discuss a new problem. Problems will not be addressed by phone call or Echart message. Appointment is needed if requesting  treatment. In order to provide you with excellent quality medical care with proper time for you to explain your problem, have an exam and receive treatment with instructions, these appointments should be limited to one new problem per visit. If you experience a new problem, in which you desire to be addressed, please make an acute office visit, we save openings on the schedule to accommodate you. Please do not save your new problem for any other type of visit, let us take care of it properly and quickly for you.   Follow up visits:  Depending on your condition(s) your provider will need to see you routinely in order to provide you with quality care and prescribe medication(s). Most chronic conditions (Example: hypertension, Diabetes, depression/anxiety... etc), require visits a couple times a year. Your provider will instruct you on proper follow up for your personal medical conditions and history. Please make certain to make follow up appointments for your condition as instructed. Failing to do so could result in lapse in your medication treatment/refills. If you request a refill, and are overdue to be seen on a condition, we will always provide you with a 30 day script (once) to allow you time to schedule.    Medicare wellness (well visit): - we have a wonderful Nurse Maudie Mercury), that will meet with you and provide you will yearly medicare wellness visits. These visits should occur yearly (can not be scheduled less than 1 calendar year apart) and cover preventive health, immunizations, advance directives and screenings you are entitled to yearly through your medicare benefits. Do not miss out on your entitled benefits, this is when medicare will pay for these benefits to be ordered for you.  These are strongly encouraged by your provider and is the appropriate type of visit to make certain you are up to date with all preventive health benefits. If you have not had your medicare wellness exam in the last 12  months, please make certain to schedule one by calling the office and schedule your medicare wellness with Maudie Mercury as soon as possible.   Yearly physical (well visit):  - Adults are recommended to be seen yearly for physicals. Check with your insurance and date of your last physical, most insurances require one calendar year between physicals. Physicals include all preventive health topics, screenings, medical exam and labs that are appropriate for gender/age and history. You may have fasting labs needed at this visit. This is a well visit (not a sick visit), new problems should not be covered during this visit (see acute visit).  - Pediatric patients are seen more frequently when they are younger. Your provider will advise you on well child visit timing that is appropriate for your their age. - This is not a medicare wellness visit. Medicare wellness exams do not have an exam portion to the visit. Some medicare companies allow for a physical, some do not allow a yearly physical. If your medicare  allows a yearly physical you can schedule the medicare wellness with our nurse Maudie Mercury and have your physical with your provider after, on the same day. Please check with insurance for your full benefits.   Late Policy/No Shows:  - all new patients should arrive 15-30 minutes earlier than appointment to allow [place] time  to  obtain all personal demographics,  insurance information and for you to complete office paperwork. - All established patients should arrive 10-15 minutes earlier than appointment time to update all information and be checked in .  - In our best efforts to run on time, if you are late for your appointment you will be asked to either reschedule or if able, we will work you back into the schedule. There will be a wait time to work you back in the schedule,  depending on availability.  - If you are unable to make it to your appointment as scheduled, please call 24 hours ahead of time to allow [place] to fill the  time slot with someone else who needs to be seen. If you do not cancel your appointment ahead of time, you may be charged a no show fee.

## 2018-12-11 ENCOUNTER — Telehealth: Payer: Self-pay | Admitting: Family Medicine

## 2018-12-11 LAB — IRON,TIBC AND FERRITIN PANEL
%SAT: 25 % (calc) (ref 16–45)
Ferritin: 62 ng/mL (ref 16–288)
Iron: 75 ug/dL (ref 45–160)
TIBC: 295 mcg/dL (calc) (ref 250–450)

## 2018-12-11 LAB — PTH, INTACT AND CALCIUM
Calcium: 9.1 mg/dL (ref 8.6–10.4)
PTH: 33 pg/mL (ref 14–64)

## 2018-12-11 NOTE — Telephone Encounter (Signed)
Please inform patient the following information: Iron panel is normal. Calcium and parathyroid hormone normal. Vitamin D low normal at 30 (<30 abnormal) goal typically around 40. Would recommend start of OTC vit d [phone] u daily if not taking. If taking and not reported- add [phone].  Neurology should be calling to schedule. Referred her at her last appt.

## 2018-12-12 NOTE — Telephone Encounter (Signed)
Pt was called and VM was left to return call  °

## 2018-12-18 NOTE — Telephone Encounter (Signed)
Pt was called and given results, she verbalized understanding

## 2018-12-19 ENCOUNTER — Encounter: Payer: Self-pay | Admitting: Neurology

## 2018-12-24 DIAGNOSIS — H524 Presbyopia: Secondary | ICD-10-CM | POA: Diagnosis not present

## 2018-12-24 DIAGNOSIS — E119 Type 2 diabetes mellitus without complications: Secondary | ICD-10-CM | POA: Diagnosis not present

## 2019-01-09 ENCOUNTER — Ambulatory Visit: Payer: Medicare HMO | Admitting: Neurology

## 2019-01-16 ENCOUNTER — Encounter: Payer: Self-pay | Admitting: Neurology

## 2019-01-17 NOTE — Progress Notes (Signed)
[facility] Clinic Note - Initial Visit   Date: 01/21/19  [name] MRN: [id number] DOB: May 18, 1951   Dear Dr. Raoul Pitch:  Thank you for your kind referral of Kathleen Gibson for consultation of bilateral leg pain. Although her history is well known to you, please allow [place] to reiterate it for the purpose of our medical record. The patient was accompanied to the clinic by self.   History of Present Illness: Kathleen Gibson is a 68 y.o. right-handed female with chronic ITP, pernicious anemia, vitamin B12 deficiency, diabetes mellitus, presenting for evaluation of bilateral leg pain and numbness.   She complains of soreness in the lower legs which moves into her thighs and low back.  She has throbbing pain.  Pain is only present when she gets up to stand up from a resting position.  Pain is alleviated within a few steps of walking.  Pain is not present with prolonged walking or sitting.  No electrical sensation, numbness/tingling in the legs.  She has numbness over the tips of the toes and fingers for quite some time, but noticed it more over the past 6 months.  Symptoms are constant without identifiable alleviating or exacerbating factors.  Balance is okay.  She walks unassisted and has not suffered any falls. Diabetes has recently been under good control, but in the past was poorly managed due to medication noncompliance.   Out-side paper records, electronic medical record, and images have been reviewed where available and summarized as:  Lab Results  Component Value Date   HGBA1C 6.9 (A) 12/10/2018   HGBA1C 6.9 12/10/2018   HGBA1C 6.9 (A) 12/10/2018   HGBA1C 6.9 12/10/2018   Lab Results  Component Value Date   VITAMINB12 402 07/26/2018   Lab Results  Component Value Date   TSH 1.76 07/26/2018   Lab Results  Component Value Date   ESRSEDRATE 32 (H) 07/24/2018   Lab Results  Component Value Date   CKTOTAL 33 07/26/2018   TROPONINI <0.03 07/24/2018      Past Medical History:  Diagnosis Date  . Diabetes ([place])   . HLD (hyperlipidemia)   . Hypertension   . Pernicious anemia   . Vitamin D deficiency     Past Surgical History:  Procedure Laterality Date  . ABDOMINAL HERNIA REPAIR  2009  . ABDOMINOPLASTY    . [place]  . GASTRIC BYPASS  2000  . hemorroid surgery  2007  . TONSILLECTOMY AND ADENOIDECTOMY  1957     Medications:  Outpatient Encounter Medications as of 01/21/2019  Medication Sig  . ferrous sulfate 325 (65 FE) MG tablet Take 1 tablet (325 mg total) by mouth 2 (two) times daily with a meal. (Patient taking differently: Take 325 mg by mouth daily. )  . gabapentin (NEURONTIN) 300 MG capsule 900 my QHS and 300 mg every 8 hrs in day (Patient taking differently: 92m at hs)  . glucose monitoring kit (FREESTYLE) monitoring kit 1 each by Does not apply route as needed for other. Use as directed  . glyBURIDE (DIABETA) 2.5 MG tablet Take 1 tablet (2.5 mg total) by mouth daily with breakfast.  . hydrochlorothiazide (HYDRODIURIL) 12.5 MG tablet Take 1 tablet (12.5 mg total) by mouth daily.  . metoprolol succinate (TOPROL-XL) 50 MG 24 hr tablet Take 1 tablet (50 mg total) by mouth daily.  . Omega-3 Fatty Acids (FISH OIL) 1000 MG CAPS Take 1,000 mg by mouth daily.   . SitaGLIPtin-MetFORMIN HCl [phone] MG  TB24 Take 1 tablet by mouth at bedtime.  . Vitamin D, Cholecalciferol, 25 MCG (1000 UT) CAPS Take by mouth.  . zolpidem (AMBIEN) 5 MG tablet Take 1 tablet (5 mg total) by mouth at bedtime.   No facility-administered encounter medications on file as of 01/21/2019.     Allergies:  Allergies  Allergen Reactions  . Asa [Aspirin] Other (See Comments)    Contraindicated d/t low plts  . Nsaids Other (See Comments)    Contraindicated d/t low plts    Family History: Family History  Problem Relation Age of Onset  . Cancer Mother 14       unknown type cancer   . Hypertension Mother   . Hypertension Father   . Diabetes  Sister   . Leukemia Brother   . Colon cancer Brother 23    Social History: Social History   Tobacco Use  . Smoking status: Never Smoker  . Smokeless tobacco: Never Used  Substance Use Topics  . Alcohol use: No  . Drug use: No   Social History   Social History Narrative   Divorced. Retired Pension scheme manager.   12th grade education.   Drinks caffeine.   Smoke alarm in the home. Wears her seatbelt.   Feels safe in her relationships.   Two story home   Right handed     Review of Systems:  CONSTITUTIONAL: No fevers, chills, night sweats, or weight loss.   EYES: No visual changes or eye pain ENT: No hearing changes.  No history of nose bleeds.   RESPIRATORY: No cough, wheezing and shortness of breath.   CARDIOVASCULAR: Negative for chest pain, and palpitations.   GI: Negative for abdominal discomfort, blood in stools or black stools.  No recent change in bowel habits.   GU:  No history of incontinence.   MUSCLOSKELETAL: No history of joint pain or swelling.  +myalgias.   SKIN: Negative for lesions, rash, and itching.   HEMATOLOGY/ONCOLOGY: Negative for prolonged bleeding, bruising easily, and swollen nodes.  No history of cancer.   ENDOCRINE: Negative for cold or heat intolerance, polydipsia or goiter.   PSYCH:  No depression or anxiety symptoms.   NEURO: As Above.   Vital Signs:  BP 131/79   Pulse 77   Temp 98.3 F (36.8 C)   Ht _0  (1.6 m)   Wt 189 lb 12.8 oz (86.1 kg)   SpO2 97%   BMI 33.62 kg/m    General Medical Exam:   General:  Well appearing, comfortable, hirsuitism.   Eyes/ENT: see cranial nerve examination.   Neck:   No carotid bruits. Respiratory:  Clear to auscultation, good air entry bilaterally.   Cardiac:  Regular rate and rhythm, no murmur.   Extremities:  No deformities, edema, or skin discoloration.  Skin:  No rashes or lesions.  Neurological Exam: MENTAL STATUS including orientation to time, place, person, recent and remote memory,  attention span and concentration, language, and fund of knowledge is normal.  Speech is not dysarthric.  CRANIAL NERVES: II:  No visual field defects.  Unremarkable fundi.   III-IV-VI: Pupils equal round and reactive to light.  Normal conjugate, extra-ocular eye movements in all directions of gaze.  No nystagmus.  No ptosis.   V:  Normal facial sensation.    VII:  Normal facial symmetry and movements.   VIII:  Normal hearing and vestibular function.   IX-X:  Normal palatal movement.   XI:  Normal shoulder shrug and head rotation.   XII:  Normal tongue strength and range of motion, no deviation or fasciculation.  MOTOR:  No atrophy, fasciculations or abnormal movements.  No pronator drift.   Upper Extremity:  Right  Left  Deltoid  5/5   5/5   Biceps  5/5   5/5   Triceps  5/5   5/5   Infraspinatus 5/5  5/5  Medial pectoralis 5/5  5/5  Wrist extensors  5/5   5/5   Wrist flexors  5/5   5/5   Finger extensors  5/5   5/5   Finger flexors  5/5   5/5   Dorsal interossei  5/5   5/5   Abductor pollicis  5/5   5/5   Tone (Ashworth scale)  0  0   Lower Extremity:  Right  Left  Hip flexors  5/5   5/5   Hip extensors  5/5   5/5   Adductor 5/5  5/5  Abductor 5/5  5/5  Knee flexors  5/5   5/5   Knee extensors  5/5   5/5   Dorsiflexors  5/5   5/5   Plantarflexors  5/5   5/5   Toe extensors  5/5   5/5   Toe flexors  5/5   5/5   Tone (Ashworth scale)  0  0   MSRs:  Right        Left                  brachioradialis 2+  2+  biceps 2+  2+  triceps 2+  2+  patellar 1+  1+  ankle jerk 0  0  Hoffman no  no  plantar response down  down   SENSORY:  Vibration is trace at the ankles bilaterally.  Temperature and pin prick is reduced from the mid-calf distally.  Sensation in the hands is intact.  Rhomberg testing is positive.  COORDINATION/GAIT: Normal finger-to- nose-finger and heel-to-shin.  Intact rapid alternating movements bilaterally.  Able to rise from a chair without using arms.  Gait  narrow based and stable. She is unsteady with tandem gait and able to perform stressed gait.  IMPRESSION: 1. Diabetic peripheral neuropathy involving a stocking-glove distribution. Her neurological examination shows a distal predominant large fiber peripheral neuropathy. I had extensive discussion with the patient regarding the pathogenesis, etiology, management, and natural course of neuropathy. Neuropathy tends to be slowly progressive and it is important to keep diabetes well-controlled to minimize progression.  Management is symptomatic.  Fortunately, she does not have painful paresthesias, so there is no role for medications.   Balance is good, she is cautious on uneven ground. She was instructed to use grab bars or shower chair in the bathroom Daily foot inspection NCS/EMG only if symptoms get worse.   2. Transient bilateral achy leg pain seems to be more musculoskeletal in nature.   Follow-up with PCP  Greater than 50% of this 45 min visit was spent in counseling, explanation of diagnosis, planning of further management, and coordination of care.  Thank you for allowing me to participate in patient's care.  If I can answer any additional questions, I would be pleased to do so.    Sincerely,    Donika K. Posey Pronto, DO

## 2019-01-21 ENCOUNTER — Other Ambulatory Visit: Payer: Self-pay

## 2019-01-21 ENCOUNTER — Ambulatory Visit (INDEPENDENT_AMBULATORY_CARE_PROVIDER_SITE_OTHER): Payer: Medicare HMO | Admitting: Neurology

## 2019-01-21 ENCOUNTER — Encounter: Payer: Self-pay | Admitting: Neurology

## 2019-01-21 VITALS — BP 131/79 | HR 77 | Temp 98.3°F | Ht 63.0 in | Wt 189.8 lb

## 2019-01-21 DIAGNOSIS — M79605 Pain in left leg: Secondary | ICD-10-CM | POA: Diagnosis not present

## 2019-01-21 DIAGNOSIS — E1142 Type 2 diabetes mellitus with diabetic polyneuropathy: Secondary | ICD-10-CM

## 2019-01-21 DIAGNOSIS — M79604 Pain in right leg: Secondary | ICD-10-CM

## 2019-01-21 NOTE — Patient Instructions (Signed)
It is important to continue to keep your diabetes under good control, as you are doing. Inspect your feet daily Use a shower chair or grab bar in the shower If your numbness gets worse, please call my office to schedule nerve testing  Your shin pain seems to be more musculoskeletal pain.  Please follow-up with your primary care doctor for this.

## 2019-01-24 ENCOUNTER — Other Ambulatory Visit: Payer: Self-pay

## 2019-01-24 ENCOUNTER — Ambulatory Visit (INDEPENDENT_AMBULATORY_CARE_PROVIDER_SITE_OTHER): Payer: Medicare HMO

## 2019-01-24 DIAGNOSIS — E538 Deficiency of other specified B group vitamins: Secondary | ICD-10-CM | POA: Diagnosis not present

## 2019-01-24 DIAGNOSIS — Z20828 Contact with and (suspected) exposure to other viral communicable diseases: Secondary | ICD-10-CM | POA: Diagnosis not present

## 2019-01-24 MED ORDER — CYANOCOBALAMIN 1000 MCG/ML IJ SOLN
1000.0000 ug | Freq: Once | INTRAMUSCULAR | Status: AC
  Administered 2019-01-24: 1000 ug via INTRAMUSCULAR

## 2019-01-24 NOTE — Progress Notes (Signed)
Kathleen Gibson is a 68 y.o. female presents to the office today for Vitamin B12 injections, per physician's orders. Original order: Restart  q 4 week  b12 injections by nurse visit x12.  Vitamin B12, 1,000mg ,  IM was administered left deltoid today. Patient tolerated injection. Patient due for follow up labs/provider appt: Yes. Date due: 03/2019, appt made No Patient next injection due: 02/21/19, appt made No   [name]. CMA

## 2019-03-28 ENCOUNTER — Ambulatory Visit (INDEPENDENT_AMBULATORY_CARE_PROVIDER_SITE_OTHER): Payer: Medicare HMO | Admitting: Family Medicine

## 2019-03-28 ENCOUNTER — Other Ambulatory Visit: Payer: Self-pay

## 2019-03-28 DIAGNOSIS — E538 Deficiency of other specified B group vitamins: Secondary | ICD-10-CM

## 2019-03-28 MED ORDER — CYANOCOBALAMIN 1000 MCG/ML IJ SOLN
1000.0000 ug | Freq: Once | INTRAMUSCULAR | Status: AC
  Administered 2019-03-28: 1000 ug via INTRAMUSCULAR

## 2019-03-28 NOTE — Progress Notes (Addendum)
  Kathleen Gibson is a 68 y.o. female presents to the office today for Vitamin B12 injections, per physician's orders. Original order: Restart q 4 week b12 injections by nurse visit x12.  Vitamin B12, 1,000mg ,  IM was administered left deltoid today. Patient tolerated injection. Patient due for follow up labs/provider appt: Yes. Date due: 04/11/2019, appt made Yes Patient next injection due: 04/25/19, appt made No  Patient missed her September B12 injection.   Medical screening examination/treatment/procedure(s) were performed by non-physician practitioner and as supervising physician I was immediately available for consultation/collaboration.  I agree with above assessment and plan.  Electronically Signed by: Howard Pouch, DO [facility] [place]

## 2019-04-11 ENCOUNTER — Ambulatory Visit: Payer: Medicare HMO | Admitting: Family Medicine

## 2019-04-15 ENCOUNTER — Encounter: Payer: Self-pay | Admitting: Family Medicine

## 2019-04-15 ENCOUNTER — Ambulatory Visit (INDEPENDENT_AMBULATORY_CARE_PROVIDER_SITE_OTHER): Payer: Medicare HMO | Admitting: Family Medicine

## 2019-04-15 ENCOUNTER — Other Ambulatory Visit: Payer: Self-pay

## 2019-04-15 VITALS — BP 118/78 | HR 70 | Temp 97.9°F | Resp 18 | Ht 63.0 in | Wt 191.2 lb

## 2019-04-15 DIAGNOSIS — G4700 Insomnia, unspecified: Secondary | ICD-10-CM | POA: Diagnosis not present

## 2019-04-15 DIAGNOSIS — N898 Other specified noninflammatory disorders of vagina: Secondary | ICD-10-CM

## 2019-04-15 DIAGNOSIS — Z5320 Procedure and treatment not carried out because of patient's decision for unspecified reasons: Secondary | ICD-10-CM | POA: Diagnosis not present

## 2019-04-15 DIAGNOSIS — E611 Iron deficiency: Secondary | ICD-10-CM | POA: Diagnosis not present

## 2019-04-15 DIAGNOSIS — E785 Hyperlipidemia, unspecified: Secondary | ICD-10-CM

## 2019-04-15 DIAGNOSIS — E1140 Type 2 diabetes mellitus with diabetic neuropathy, unspecified: Secondary | ICD-10-CM | POA: Diagnosis not present

## 2019-04-15 DIAGNOSIS — R000 Tachycardia, unspecified: Secondary | ICD-10-CM

## 2019-04-15 DIAGNOSIS — D510 Vitamin B12 deficiency anemia due to intrinsic factor deficiency: Secondary | ICD-10-CM | POA: Diagnosis not present

## 2019-04-15 DIAGNOSIS — I10 Essential (primary) hypertension: Secondary | ICD-10-CM

## 2019-04-15 DIAGNOSIS — E538 Deficiency of other specified B group vitamins: Secondary | ICD-10-CM | POA: Diagnosis not present

## 2019-04-15 DIAGNOSIS — D693 Immune thrombocytopenic purpura: Secondary | ICD-10-CM | POA: Diagnosis not present

## 2019-04-15 DIAGNOSIS — R21 Rash and other nonspecific skin eruption: Secondary | ICD-10-CM

## 2019-04-15 DIAGNOSIS — E1142 Type 2 diabetes mellitus with diabetic polyneuropathy: Secondary | ICD-10-CM

## 2019-04-15 LAB — POCT GLYCOSYLATED HEMOGLOBIN (HGB A1C)
HbA1c POC (<> result, manual entry): 6.7 % (ref 4.0–5.6)
HbA1c, POC (controlled diabetic range): 6.7 % (ref 0.0–7.0)
HbA1c, POC (prediabetic range): 6.7 % — AB (ref 5.7–6.4)
Hemoglobin A1C: 6.7 % — AB (ref 4.0–5.6)

## 2019-04-15 LAB — CBC WITH DIFFERENTIAL/PLATELET
Basophils Absolute: 0 10*3/uL (ref 0.0–0.1)
Basophils Relative: 0.4 % (ref 0.0–3.0)
Eosinophils Absolute: 0 10*3/uL (ref 0.0–0.7)
Eosinophils Relative: 0.2 % (ref 0.0–5.0)
HCT: 35.3 % — ABNORMAL LOW (ref 36.0–46.0)
Hemoglobin: 11.8 g/dL — ABNORMAL LOW (ref 12.0–15.0)
Lymphocytes Relative: 21.6 % (ref 12.0–46.0)
Lymphs Abs: 1.5 10*3/uL (ref 0.7–4.0)
MCHC: 33.3 g/dL (ref 30.0–36.0)
MCV: 89.3 fl (ref 78.0–100.0)
Monocytes Absolute: 1.2 10*3/uL — ABNORMAL HIGH (ref 0.1–1.0)
Monocytes Relative: 18.2 % — ABNORMAL HIGH (ref 3.0–12.0)
Neutro Abs: 4 10*3/uL (ref 1.4–7.7)
Neutrophils Relative %: 59.6 % (ref 43.0–77.0)
Platelets: 77 10*3/uL — ABNORMAL LOW (ref 150.0–400.0)
RBC: 3.95 Mil/uL (ref 3.87–5.11)
RDW: 13.9 % (ref 11.5–15.5)
WBC: 6.7 10*3/uL (ref 4.0–10.5)

## 2019-04-15 LAB — VITAMIN B12: Vitamin B-12: 711 pg/mL (ref 211–911)

## 2019-04-15 MED ORDER — GLYBURIDE 2.5 MG PO TABS: 2.5000 mg | ORAL_TABLET | Freq: Every day | ORAL | 1 refills | Status: DC

## 2019-04-15 MED ORDER — ZOLPIDEM TARTRATE 5 MG PO TABS: 5.0000 mg | ORAL_TABLET | Freq: Every day | ORAL | 1 refills | Status: DC

## 2019-04-15 MED ORDER — HYDROCHLOROTHIAZIDE 12.5 MG PO TABS: 12.5000 mg | ORAL_TABLET | Freq: Every day | ORAL | 1 refills | Status: DC

## 2019-04-15 MED ORDER — FLUCONAZOLE 150 MG PO TABS: 150.0000 mg | ORAL_TABLET | Freq: Once | ORAL | 0 refills | Status: AC

## 2019-04-15 MED ORDER — FERROUS SULFATE 325 (65 FE) MG PO TABS: 325.0000 mg | ORAL_TABLET | Freq: Two times a day (BID) | ORAL | 0 refills | Status: DC

## 2019-04-15 MED ORDER — SITAGLIP PHOS-METFORMIN HCL ER 100-1000 MG PO TB24: 1.0000 | ORAL_TABLET | Freq: Every day | ORAL | 3 refills | Status: DC

## 2019-04-15 MED ORDER — METOPROLOL SUCCINATE ER 50 MG PO TB24: 50.0000 mg | ORAL_TABLET | Freq: Every day | ORAL | 1 refills | Status: DC

## 2019-04-15 MED ORDER — GABAPENTIN 300 MG PO CAPS: ORAL_CAPSULE | ORAL | 1 refills | Status: DC

## 2019-04-15 NOTE — Progress Notes (Signed)
Patient ID: Kathleen Gibson, female  DOB: 21-Sep-1950, 68 y.o.   MRN: [id number] Patient Care Team    Relationship Specialty Notifications Start End  [name], DO PCP - General Family Medicine  11/08/17   Doran Stabler, MD Consulting Physician Gastroenterology  04/06/17   Truitt Merle, MD Consulting Physician Hematology  04/06/17   Alda Berthold, [place] Physician Neurology  01/16/19     Chief Complaint  Patient presents with  . Diabetes    A1C completed.     Subjective:  Kathleen Gibson is a 68 y.o.  female present for [facility] follow up.  Diabetes: Pt reports compliance with janumet and glipizide 2.5 mg.  Patient denies dizziness, hyperglycemic or hypoglycemic events. Patient denies numbness, tingling in the extremities or nonhealing wounds of feet.  PNA series: PNA23 06/2017, repeat prevnar 06/2018 Flu shot: Patient declined (recommneded yearly) BMP: 07/2018 GFR 55 Foot exam: completed today  Eye exam: 8/3/22020 completed- vision works at [employer]. A1c: 8..6--> 8.3--> 7.9--> 7.2--> 7.3 --> 6.6 >>> 6.9>> 6.7 today  Hypertension/hypertriglyceridemia Pt reports complaince with toprol xl 50 mg qd and HCTZ 12.5 mg QD. Patient denies chest pain, shortness of breath, dizziness or lower extremity edema.  Pt does not take a daily baby ASA (low platelets). Pt is not prescribed statin. BMP: 08/02/18 CBC: 07/2018 plt 86 Lipids: 11/07/2017 good with the exception of tg 171 Tsh: 07/2018 WNL Diet: Does not watch routinely Exercise: Does not exercise routinely RF: Hypertension, diabetes, hyperlipidemia, obesity  INSOMNIA, CHRONIC/RLS Ambien 5 mg not working as well as 10 mg. Gabapentin-900 mg QHS is working ok for RLS, but not always. Works ok, better than prior. She would like to stay on current regimen.   Chronic ITP (idiopathic thrombocytopenia) (HCC) Labs 11/07/2017 plts dropped to 34.  Since that time they have stabilized averaging around 80.  She saw her heme in June. Per  their note she would be managed clinically as long as remained above 30K. Otherwise she would need referred to surgery for splenectomy. She denies increased bleeding or bruising today.  She has had an intermittent rash of her lower extremities which if not improved with creams or antihistamines.  b12 deficiency/pernicious anemia/myalgia/weakness:  B12 injections have been restarted and she is now on every 4 weeks per patient.  She states she does feel improved now that she is restarted the B12.  She complains of pain radiates to her ankles and is also in her arms and hands. Discomfort is described as pain and paraesthesia type discomfort. Increasing gabapentin night dose was not helpful. She states since her ED visit earlier this year her legs and arms have continue to cause discomfort.  She has been evaluated by neurology January 21, 2019.  It was felt by the neurology team she was experiencing diabetic peripheral neuropathy involving a stocking glove distribution.  He is found to have distal predominant large fiber peripheral neuropathy on their exam.  Today not me forward with EMG testing and felt follow-up only if patient's symptoms became worse, and at that time they would recommend EMG/nerve conduction testing  Vaginitis:  White discharge and itching. Tried OTC cream and it is helping but not resolved.   Depression screen [facility] 2/9 04/11/2018 10/25/2017 04/06/2017 08/27/2014  Decreased Interest 0 0 0 0  Down, Depressed, Hopeless - 0 0 0  PHQ - 2 Score 0 0 0 0   No flowsheet data found.     Fall Risk  01/16/2019 04/11/2018 04/06/2017  Falls in the past year? 1 No No  Number falls in past yr: 0 - -  Injury with Fall? 0 - -    Immunization History  Administered Date(s) Administered  . Pneumococcal Conjugate-13 03/19/2015  . Pneumococcal Polysaccharide-23 06/03/2008, 07/11/2017  . Td 06/13/2006    No exam data present  Past Medical History:  Diagnosis Date  . Diabetes ([place])   . HLD  (hyperlipidemia)   . Hypertension   . Pernicious anemia   . Vitamin D deficiency    Allergies  Allergen Reactions  . Asa [Aspirin] Other (See Comments)    Contraindicated d/t low plts  . Nsaids Other (See Comments)    Contraindicated d/t low plts   Past Surgical History:  Procedure Laterality Date  . ABDOMINAL HERNIA REPAIR  2009  . ABDOMINOPLASTY    . [place]  . GASTRIC BYPASS  2000  . hemorroid surgery  2007  . TONSILLECTOMY AND ADENOIDECTOMY  1957   Family History  Problem Relation Age of Onset  . Cancer Mother 8       unknown type cancer   . Hypertension Mother   . Hypertension Father   . Diabetes Sister   . Leukemia Brother   . Colon cancer Brother 48   Social History   Socioeconomic History  . Marital status: Divorced    Spouse name: Not on file  . Number of children: 3  . Years of education: 23  . Highest education level: Not on file  Occupational History  . Occupation: Retired  Scientific laboratory technician  . Financial resource strain: Not on file  . Food insecurity    Worry: Not on file    Inability: Not on file  . Transportation needs    Medical: Not on file    Non-medical: Not on file  Tobacco Use  . Smoking status: Never Smoker  . Smokeless tobacco: Never Used  Substance and Sexual Activity  . Alcohol use: No  . Drug use: No  . Sexual activity: Never  Lifestyle  . Physical activity    Days per week: Not on file    Minutes per session: Not on file  . Stress: Not on file  Relationships  . Social Herbalist on phone: Not on file    Gets together: Not on file    Attends religious service: Not on file    Active member of club or organization: Not on file    Attends meetings of clubs or organizations: Not on file    Relationship status: Not on file  . Intimate partner violence    Fear of current or ex partner: Not on file    Emotionally abused: Not on file    Physically abused: Not on file    Forced sexual activity: Not on file   Other Topics Concern  . Not on file  Social History Narrative   Divorced. Retired Pension scheme manager.   12th grade education.   Drinks caffeine.   Smoke alarm in the home. Wears her seatbelt.   Feels safe in her relationships.   Two story home   Right handed    Allergies as of 04/15/2019      Reactions   Asa [aspirin] Other (See Comments)   Contraindicated d/t low plts   Nsaids Other (See Comments)   Contraindicated d/t low plts      Medication List       Accurate as of April 15, 2019  9:43 AM. If you have any questions, ask your nurse or doctor.        ferrous sulfate 325 (65 FE) MG tablet Take 1 tablet (325 mg total) by mouth 2 (two) times daily with a meal. What changed: when to take this   Fish Oil 1000 MG Caps Take 1,000 mg by mouth daily.   gabapentin 300 MG capsule Commonly known as: NEURONTIN 900 my QHS and 300 mg every 8 hrs in day What changed: additional instructions   glucose monitoring kit monitoring kit 1 each by Does not apply route as needed for other. Use as directed   glyBURIDE 2.5 MG tablet Commonly known as: DIABETA Take 1 tablet (2.5 mg total) by mouth daily with breakfast.   hydrochlorothiazide 12.5 MG tablet Commonly known as: HYDRODIURIL Take 1 tablet (12.5 mg total) by mouth daily.   metoprolol succinate 50 MG 24 hr tablet Commonly known as: TOPROL-XL Take 1 tablet (50 mg total) by mouth daily.   SitaGLIPtin-MetFORMIN HCl [phone] MG Tb24 Take 1 tablet by mouth at bedtime.   Vitamin D (Cholecalciferol) 25 MCG (1000 UT) Caps Take by mouth.   zolpidem 5 MG tablet Commonly known as: AMBIEN Take 1 tablet (5 mg total) by mouth at bedtime.       All past medical history, surgical history, allergies, family history, immunizations andmedications were updated in the EMR today and reviewed under the history and medication portions of their EMR.      ROS: 14 pt review of systems performed and negative (unless mentioned in an HPI)   Objective: BP 118/78 (BP Location: Left Arm, Patient Position: Sitting, Cuff Size: Normal)   Pulse 70   Temp 97.9 F (36.6 C) (Temporal)   Resp 18   Ht '5\' 3"'$  (1.6 m)   Wt 191 lb 4 oz (86.8 kg)   SpO2 100%   BMI 33.88 kg/m  Gen: Afebrile. No acute distress. nontoxic HENT: AT. [place].  Eyes:Pupils Equal Round Reactive to light, Extraocular movements intact,  Conjunctiva without redness, discharge or icterus. Neck/lymp/endocrine: Supple,no lymphadenopathy, no thyromegaly CV: RRR 1/6 SM, no edema, +2/4 P posterior tibialis pulses Chest: CTAB, no wheeze or crackles Skin: Excoriated maculopapular rash bilateral lower extremities, No purpura or petechiae.  Neuro:  Normal gait. PERLA. EOMi. Alert. Oriented. Psych: Normal affect, dress and demeanor. Normal speech. Normal thought content and judgment Diabetic Foot Exam - Simple   Simple Foot Form Diabetic Foot exam was performed with the following findings: Yes 04/15/2019  9:51 AM  Visual Inspection No deformities, no ulcerations, no other skin breakdown bilaterally: Yes Sensation Testing Intact to touch and monofilament testing bilaterally: Yes Pulse Check Posterior Tibialis and Dorsalis pulse intact bilaterally: Yes Comments Thick nails- long     Results for orders placed or performed in visit on 04/15/19 (from the past 48 hour(s))  POCT glycosylated hemoglobin (Hb A1C)     Status: Abnormal   Collection Time: 04/15/19  9:30 AM  Result Value Ref Range   Hemoglobin A1C 6.7 (A) 4.0 - 5.6 %   HbA1c POC (<> result, manual entry) 6.7 4.0 - 5.6 %   HbA1c, POC (prediabetic range) 6.7 (A) 5.7 - 6.4 %   HbA1c, POC (controlled diabetic range) 6.7 0.0 - 7.0 %    Assessment/plan: WILSIE KERN is a 68 y.o. female present for establishment of care (from [place]) and chronic medical conditions. Essential hypertension/hyperlipidemia/tachycardia/obesity/statin declined -Stable.  HR improved with increase in metoprolol.  - continue  metoprolol to  50  mg QD -Continue HCTZ 12.5 mg daily. - Low-sodium diet, exercise. 150 minutes a week - ASA contraindicated with thrombocytopenia.  - continue fish oil. Statin declined - F/U 4 mos  Diabetic polyneuropathy associated with type 2 diabetes mellitus ([place]) type 2 diabetes mellitus (HCC)/diabetes type 2 - mildly increased a1c, still well controlled. - continue to decrease carb and sugar. Increase exercise.  -Continue Janumet [phone] mg QD.  Refills provided today -Continue glyburide 2.5 mg - hold if fasting sugar less than 80 -Continue gabapentin (doses below) - statin declined PNA series: PNA23 06/2017, repeat prevnar 06/2018 Flu shot: Patient declined (recommneded yearly) BMP: 07/2018 GFR 55 Foot exam: completed today  Eye exam: 8/3/22020 completed- vision works at [employer]. A1c: 8..6--> 8.3--> 7.9--> 7.2--> 7.3 --> 6.6 >>> 6.9>> 6.7 today - F/U 4 mos.   INSOMNIA, CHRONIC/RLS - She reports Ambien 5 mg QD is not as effective as the 10 mg QD.  Decreased dose to 5 mg for safety profile age/female.  - Continue gabapentin 900 qhs, added gabapentin 300 mg q 8 in the day. - taking iron daily.  Last iron panel 12/10/2018.  Then normal limits. - tried trazodone- not effective.  - ambien refilled today. [facility] database reviewed 04/15/19  Thrombocytopenia (HCC)/ITP/rash Likely discussed her last hematology appointment.  Her platelets and anemia have been stable since last appointment with them.  However she does have a new rash that has been waxing and waning of her lower extremities.  Discussed concerned this could be a rash associated with certain types of leukemia.  She has had elevated monocytes for the past 4 years.  She has been complaining of quite a bit of fatigue and myalgias as well.  Her hematologist suggested considering a bone biopsy at her last appointment.  I encouraged her to follow-up with her hematologist to be sent as needed not feel they needed to pursue further work-up/bone  biopsy. Discussed today with her in detail, there is a possibility the cause of her TTP and  prior + SPEP for light chains could potentially caused by same process.     B12 deficiency/pernicious anemia/IDA/: Responding well to B12 injections every 4 weeks.  B12 level collected today. Continue iron supplementation.  Vaginal discharge: Complaint.  She has tried over-the-counter cream preparations.  Discussed we would prescribe her the Diflucan x1.  If this does not resolve her issue she will need to follow with her gynecologist or here for a pelvic exam.  .Return in about 4 months (around 08/13/2019).  Greater than 40 minutes spent with patient, >50% of time spent face to face    Note is dictated utilizing voice recognition software. Although note has been proof read prior to signing, occasional typographical errors still can be missed. If any questions arise, please do not hesitate to call for verification.  Electronically signed by: Howard Pouch, DO [place]

## 2019-04-15 NOTE — Patient Instructions (Addendum)
Bp and diabetes look great.  I have refilled your medications for you.  Follow up in 4 mos.

## 2019-04-16 ENCOUNTER — Telehealth: Payer: Self-pay | Admitting: Family Medicine

## 2019-04-16 ENCOUNTER — Encounter: Payer: Self-pay | Admitting: Family Medicine

## 2019-04-16 DIAGNOSIS — R21 Rash and other nonspecific skin eruption: Secondary | ICD-10-CM | POA: Insufficient documentation

## 2019-04-16 NOTE — Telephone Encounter (Signed)
Please inform patient the following information: Her b12 looks great > 700.  - She can continue B12 q 4 weeks injections. Order is good for x12 injections.  She can elect to perform these at home if desired and she is comfortable with doing so. If so, we will need to call in the prescription. Otherwise, nurse visit visit q 4 weeks for b12 injection approved x 12.   Her blood counts are consistent with prior labs and her platelets are holding steady at 77. I do have concerns her  Rash presentation could be a manifestation of her possible blood disorder. With chronic elevated level of monocytes (type of white blood cell) ,  such as hers, the chronic rash and her anemia with low platelets can all be symptoms/signs of leukemia. Leukemias can be slow to fully show their signs, making it more difficult to diagnose. A bone biopsy- such has her hematologist has expressed interest in considering if symptoms progressed would be the only way to diagnose her.   I will attempt to make contact with her blood specialist and update them on her labs and rash. She should also make sure to follow up with them with concerns.

## 2019-04-16 NOTE — Telephone Encounter (Signed)
Pt was called and given information. She verbalized understanding. She would like to continue to get B12 injections at the office

## 2019-04-22 DIAGNOSIS — Z20828 Contact with and (suspected) exposure to other viral communicable diseases: Secondary | ICD-10-CM | POA: Diagnosis not present

## 2019-04-24 ENCOUNTER — Encounter: Payer: Self-pay | Admitting: Family Medicine

## 2019-04-24 NOTE — Telephone Encounter (Signed)
Pt wrote the following my chart message:  I just got a letter from my insurance and they no longer will cover my glyburide 2.5. They do cover gliopizide or glimepiride.  This will begin in january. If you would let me know what you think. I would need a new perscription.  Last appt was 04/15/2019 for DM check. Please advise.

## 2019-04-25 ENCOUNTER — Ambulatory Visit (INDEPENDENT_AMBULATORY_CARE_PROVIDER_SITE_OTHER): Payer: Medicare HMO

## 2019-04-25 ENCOUNTER — Telehealth: Payer: Self-pay

## 2019-04-25 ENCOUNTER — Other Ambulatory Visit: Payer: Self-pay

## 2019-04-25 DIAGNOSIS — E538 Deficiency of other specified B group vitamins: Secondary | ICD-10-CM

## 2019-04-25 MED ORDER — GLIPIZIDE 5 MG PO TABS: 5.0000 mg | ORAL_TABLET | Freq: Every day | ORAL | 1 refills | Status: DC

## 2019-04-25 MED ORDER — CYANOCOBALAMIN 1000 MCG/ML IJ SOLN
1000.0000 ug | Freq: Once | INTRAMUSCULAR | Status: AC
  Administered 2019-04-25: 1000 ug via INTRAMUSCULAR

## 2019-04-25 NOTE — Telephone Encounter (Signed)
Please inform patient the following information: Please call pharmacy and cancel any future glyburide refills. Please inform patient when she is finished with her glyburide, she will transition over to glipizide which I have called in today.   -The glipizide to glyburide dose conversion is not exact.  Typically people need just a little bit more of the glipizide.  That being said, she is to start with 1 tab of the glipizide (5 mg) and monitor her sugar.  If her sugars are dipping too low or if she is symptomatic, decrease to half a tab daily.

## 2019-04-25 NOTE — Progress Notes (Addendum)
Kathleen Gibson a 68 y.o.femalepresents to the office today for Vitamin B12injections, per physician's orders. Original order:Restart q 4 week b12 injections by nurse visit x12.  Vitamin B12,1,000mg ,IMwas administered left deltoidtoday. Patient tolerated injection.  Patient due for follow up labs/provider appt:Yes. Date due:08/13/2019 appt [facility] Patient next injection due: 05/23/2019 appt [facility]  Electronically Signed by: Howard Pouch, DO [facility]- OR

## 2019-04-25 NOTE — Telephone Encounter (Signed)
Spoke with patient per Dr. Ernestina Penna recommending bone marrow biopsy to rule out marrow disease.  The patient states she is leaving tomorrow to go to [place] for 3 to 4 weeks.  She will call our office when she comes back to schedule.

## 2019-04-25 NOTE — Telephone Encounter (Signed)
Called patients pharmacy and canceled all Glyburide rx. Called patient and lvm to call back to discuss new plan with new medication. Sent detailed mychart message explaining new medication for patient

## 2019-05-14 ENCOUNTER — Ambulatory Visit (INDEPENDENT_AMBULATORY_CARE_PROVIDER_SITE_OTHER): Payer: Medicare HMO | Admitting: Family Medicine

## 2019-05-14 ENCOUNTER — Other Ambulatory Visit: Payer: Self-pay | Admitting: Neurology

## 2019-05-14 ENCOUNTER — Encounter: Payer: Self-pay | Admitting: Family Medicine

## 2019-05-14 ENCOUNTER — Telehealth: Payer: Self-pay

## 2019-05-14 ENCOUNTER — Other Ambulatory Visit: Payer: Self-pay

## 2019-05-14 VITALS — Ht 63.0 in | Wt 195.0 lb

## 2019-05-14 DIAGNOSIS — K047 Periapical abscess without sinus: Secondary | ICD-10-CM | POA: Diagnosis not present

## 2019-05-14 MED ORDER — CHLORHEXIDINE GLUCONATE 0.12 % MT SOLN: 15.0000 mL | Freq: Two times a day (BID) | OROMUCOSAL | 0 refills | Status: DC

## 2019-05-14 MED ORDER — AMOXICILLIN-POT CLAVULANATE 875-125 MG PO TABS: 1.0000 | ORAL_TABLET | Freq: Two times a day (BID) | ORAL | 0 refills | Status: DC

## 2019-05-14 MED ORDER — FERROUS SULFATE 325 (65 FE) MG PO TABS: 325.0000 mg | ORAL_TABLET | Freq: Two times a day (BID) | ORAL | 1 refills | Status: DC

## 2019-05-14 NOTE — Telephone Encounter (Signed)
Pt left message on nurses VM stating she was in [place] another week and that a cap on her tooth has came off and it has caused a abscess and it very painful. She does not want to go to urgent care due to the increase in [place] cases in [place]. She is asking if abx could be sent to the local pharmacy in [place] until she can see her dentist when she returns. Pt did ask for amoxicillin.   I spoke with patient and she stated she was willing to try with a virtual visit but did not get great service where they are at but could do a telephone visit. Please advise.

## 2019-05-14 NOTE — Progress Notes (Signed)
VIRTUAL VISIT VIA VIDEO  I connected with [name] on 05/14/19 at 11:30 AM EST by a video enabled telemedicine application and verified that I am speaking with the correct person using two identifiers. Location patient: Home Location provider: [facility], Office Persons participating in the virtual visit: Patient, Dr. Raoul Pitch and R.Baker, LPN  I discussed the limitations of evaluation and management by telemedicine and the availability of in person appointments. The patient expressed understanding and agreed to proceed.   SUBJECTIVE Chief Complaint  Patient presents with  . Jaw Pain    Right side of jaw is swollen and painful that started yesterday. Pt has had chills since last night, but is unable to take temp. Pt went to dentist before leaving for [place] and two caps had came off, plans to get fixed when returning to [place]   . Dental Pain    HPI: Kathleen Gibson is a 68 y.o. female present for right-sided facial and jaw pain that started yesterday. She states it is radiating to her ear as well. She endorses having chills last night. She is currently in [place] visiting family for the holiday. She states just before leaving [place] she had seen her dentist and is in need of replacement of 2 caps.  ROS: See pertinent positives and negatives per HPI.  Patient Active Problem List   Diagnosis Date Noted  . Rash and nonspecific skin eruption 04/16/2019  . Rash 08/09/2018  . Statin declined 08/09/2018  . Polyarthralgia 07/31/2018  . Myalgia 07/26/2018  . Weakness 07/26/2018  . Monocytosis 11/07/2017  . Tachycardia 10/25/2017  . Iron deficiency 04/10/2017  . B12 deficiency 04/06/2017  . Hereditary and idiopathic peripheral neuropathy 05/25/2016  . Chronic ITP (idiopathic thrombocytopenia) (HCC) 04/25/2015  . INSOMNIA, CHRONIC 10/06/2009  . Diabetic polyneuropathy ([place]) 11/16/2007  . HLD (hyperlipidemia) 10/05/2007  . ANEMIA, PERNICIOUS 03/02/2007  . Essential hypertension  12/27/2006    Social History   Tobacco Use  . Smoking status: Never Smoker  . Smokeless tobacco: Never Used  Substance Use Topics  . Alcohol use: No    Current Outpatient Medications:  .  ferrous sulfate 325 (65 FE) MG tablet, Take 1 tablet (325 mg total) by mouth 2 (two) times daily with a meal., Disp: 60 tablet, Rfl: 1 .  gabapentin (NEURONTIN) 300 MG capsule, 900 my QHS and 300 mg every 8 hrs in day, Disp: 450 capsule, Rfl: 1 .  glipiZIDE (GLUCOTROL) 5 MG tablet, Take 1 tablet (5 mg total) by mouth daily before breakfast., Disp: 90 tablet, Rfl: 1 .  glucose monitoring kit (FREESTYLE) monitoring kit, 1 each by Does not apply route as needed for other. Use as directed, Disp: 1 each, Rfl: 0 .  hydrochlorothiazide (HYDRODIURIL) 12.5 MG tablet, Take 1 tablet (12.5 mg total) by mouth daily., Disp: 90 tablet, Rfl: 1 .  metoprolol succinate (TOPROL-XL) 50 MG 24 hr tablet, Take 1 tablet (50 mg total) by mouth daily., Disp: 90 tablet, Rfl: 1 .  Omega-3 Fatty Acids (FISH OIL) 1000 MG CAPS, Take 1,000 mg by mouth daily. , Disp: , Rfl:  .  SitaGLIPtin-MetFORMIN HCl [phone] MG TB24, Take 1 tablet by mouth at bedtime., Disp: 90 tablet, Rfl: 3 .  Vitamin D, Cholecalciferol, 25 MCG (1000 UT) CAPS, Take by mouth., Disp: , Rfl:  .  zolpidem (AMBIEN) 5 MG tablet, Take 1 tablet (5 mg total) by mouth at bedtime., Disp: 90 tablet, Rfl: 1 .  amoxicillin-clavulanate (AUGMENTIN) 875-125 MG tablet,  Take 1 tablet by mouth 2 (two) times daily., Disp: 20 tablet, Rfl: 0 .  chlorhexidine (PERIDEX) 0.12 % solution, Use as directed 15 mLs in the mouth or throat 2 (two) times daily., Disp: 120 mL, Rfl: 0  Allergies  Allergen Reactions  . Asa [Aspirin] Other (See Comments)    Contraindicated d/t low plts  . Nsaids Other (See Comments)    Contraindicated d/t low plts    OBJECTIVE: Ht '5\' 3"'$  (1.6 m)   Wt 195 lb (88.5 kg)   BMI 34.54 kg/m  Gen: No acute distress. Nontoxic in appearance.  HENT: AT. Right facial  swelling present nasolabial fold to jawline. Eyes:Pupils Equal Round Reactive to light, Extraocular movements intact,  Conjunctiva without redness, discharge or icterus. Chest: Cough or shortness of breath not present.  Neuro: Alert. Oriented x3   ASSESSMENT AND PLAN: [name] is a 68 y.o. female present for  Tooth abscess Exam today patient with right-sided facial swelling. Called in Augmentin twice daily and Peridex mouthwash twice daily. Encouraged her to call her dentist to have work done when she returns from [place]. She was advised if fever, chills, worsening swelling or pain continues despite antibiotic use she is to be seen urgently at her dentist, urgent care or emergency room setting.  > 15 minutes spent with patient, > 50% of that time face to face    Howard Pouch, DO 05/14/2019

## 2019-05-15 ENCOUNTER — Ambulatory Visit: Payer: Medicare HMO | Admitting: Family Medicine

## 2019-07-05 ENCOUNTER — Ambulatory Visit: Payer: Medicare HMO | Attending: Internal Medicine

## 2019-07-05 DIAGNOSIS — Z23 Encounter for immunization: Secondary | ICD-10-CM

## 2019-07-05 NOTE — Progress Notes (Signed)
   Covid-19 Vaccination Clinic  Name:  Kathleen Gibson    MRN: [id number] DOB: 07-15-1950  07/05/2019  [name] was observed post Covid-19 immunization for 15 minutes without incidence. She was provided with Vaccine Information Sheet and instruction to access the V-Safe system.   [name] was instructed to call 911 with any severe reactions post vaccine: [name] Difficulty breathing  . Swelling of your face and throat  . A fast heartbeat  . A bad rash all over your body  . Dizziness and weakness    Immunizations Administered    Name Date Dose VIS Date Route   Pfizer COVID-19 Vaccine 07/05/2019  5:48 PM 0.3 mL 05/24/2019 Intramuscular   Manufacturer: [place]   Lot: [id number]   [place]: [id number]

## 2019-07-26 ENCOUNTER — Ambulatory Visit: Payer: Medicare HMO | Attending: Internal Medicine

## 2019-07-26 DIAGNOSIS — Z23 Encounter for immunization: Secondary | ICD-10-CM | POA: Insufficient documentation

## 2019-07-26 NOTE — Progress Notes (Signed)
   Covid-19 Vaccination Clinic  Name:  Kathleen Gibson    MRN: [id number] DOB: 08/31/1950  07/26/2019  [name] was observed post Covid-19 immunization for 15 minutes without incidence. She was provided with Vaccine Information Sheet and instruction to access the V-Safe system.   [name] was instructed to call 911 with any severe reactions post vaccine: [name] Difficulty breathing  . Swelling of your face and throat  . A fast heartbeat  . A bad rash all over your body  . Dizziness and weakness    Immunizations Administered    Name Date Dose VIS Date Route   Pfizer COVID-19 Vaccine 07/26/2019  4:58 PM 0.3 mL 05/24/2019 Intramuscular   Manufacturer: [place]   Lot: [id number]   [place]: [id number]

## 2019-09-02 ENCOUNTER — Other Ambulatory Visit: Payer: Self-pay

## 2019-09-02 ENCOUNTER — Ambulatory Visit (INDEPENDENT_AMBULATORY_CARE_PROVIDER_SITE_OTHER): Payer: Medicare HMO | Admitting: Family Medicine

## 2019-09-02 DIAGNOSIS — E538 Deficiency of other specified B group vitamins: Secondary | ICD-10-CM

## 2019-09-02 MED ORDER — CYANOCOBALAMIN 1000 MCG/ML IJ SOLN
1000.0000 ug | Freq: Once | INTRAMUSCULAR | Status: AC
  Administered 2019-09-02: 1000 ug via INTRAMUSCULAR

## 2019-09-02 NOTE — Progress Notes (Signed)
Kathleen Gibson a 69 y.o.femalepresents to the office today for Vitamin B12injections, per physician's orders. Original order:Restart q 4 week b12 injections by nurse visit x12.  Vitamin B12,1,000mg ,IMwas administered left deltoidtoday. Patient tolerated injection.  Patient due for follow up labs/provider appt:Yes. Date due:09/06/2019 appt madeyes- made appointment for patient during B12 injection visit.  Patient next injection due: 10/03/19 appt madeNo   Patient's last b12 was November.  Patient states she just "can't remember to get them"  I had her schedule her next dose on the way out so she would get a reminder call to hopefully help her stay on track.    Dr Anitra Lauth, Please sign off in Dr Lucita Lora absence.

## 2019-09-03 DIAGNOSIS — Z20828 Contact with and (suspected) exposure to other viral communicable diseases: Secondary | ICD-10-CM | POA: Diagnosis not present

## 2019-09-03 DIAGNOSIS — Z03818 Encounter for observation for suspected exposure to other biological agents ruled out: Secondary | ICD-10-CM | POA: Diagnosis not present

## 2019-09-04 ENCOUNTER — Emergency Department (HOSPITAL_BASED_OUTPATIENT_CLINIC_OR_DEPARTMENT_OTHER)
Admission: EM | Admit: 2019-09-04 | Discharge: 2019-09-04 | Disposition: A | Discharge status: Home / Self Care | Payer: Medicare HMO | Source: Home / Self Care | Attending: Emergency Medicine | Admitting: Emergency Medicine

## 2019-09-04 ENCOUNTER — Encounter (HOSPITAL_BASED_OUTPATIENT_CLINIC_OR_DEPARTMENT_OTHER): Payer: Self-pay | Admitting: *Deleted

## 2019-09-04 ENCOUNTER — Emergency Department (HOSPITAL_BASED_OUTPATIENT_CLINIC_OR_DEPARTMENT_OTHER): Payer: Medicare HMO

## 2019-09-04 ENCOUNTER — Other Ambulatory Visit: Payer: Self-pay

## 2019-09-04 ENCOUNTER — Telehealth: Payer: Self-pay

## 2019-09-04 DIAGNOSIS — J189 Pneumonia, unspecified organism: Secondary | ICD-10-CM

## 2019-09-04 DIAGNOSIS — N179 Acute kidney failure, unspecified: Secondary | ICD-10-CM | POA: Diagnosis not present

## 2019-09-04 DIAGNOSIS — D696 Thrombocytopenia, unspecified: Secondary | ICD-10-CM | POA: Diagnosis not present

## 2019-09-04 DIAGNOSIS — R0602 Shortness of breath: Secondary | ICD-10-CM | POA: Diagnosis not present

## 2019-09-04 DIAGNOSIS — R05 Cough: Secondary | ICD-10-CM | POA: Diagnosis not present

## 2019-09-04 DIAGNOSIS — R509 Fever, unspecified: Secondary | ICD-10-CM | POA: Diagnosis not present

## 2019-09-04 LAB — CBC WITH DIFFERENTIAL/PLATELET
Abs Immature Granulocytes: 0.59 10*3/uL — ABNORMAL HIGH (ref 0.00–0.07)
Basophils Absolute: 0.1 10*3/uL (ref 0.0–0.1)
Basophils Relative: 1 %
Eosinophils Absolute: 0 10*3/uL (ref 0.0–0.5)
Eosinophils Relative: 0 %
HCT: 31.7 % — ABNORMAL LOW (ref 36.0–46.0)
Hemoglobin: 10.5 g/dL — ABNORMAL LOW (ref 12.0–15.0)
Immature Granulocytes: 6 %
Lymphocytes Relative: 15 %
Lymphs Abs: 1.4 10*3/uL (ref 0.7–4.0)
MCH: 29.5 pg (ref 26.0–34.0)
MCHC: 33.1 g/dL (ref 30.0–36.0)
MCV: 89 fL (ref 80.0–100.0)
Monocytes Absolute: 2.1 10*3/uL — ABNORMAL HIGH (ref 0.1–1.0)
Monocytes Relative: 21 %
Neutro Abs: 5.6 10*3/uL (ref 1.7–7.7)
Neutrophils Relative %: 57 %
Platelets: 68 10*3/uL — ABNORMAL LOW (ref 150–400)
RBC: 3.56 MIL/uL — ABNORMAL LOW (ref 3.87–5.11)
RDW: 13.6 % (ref 11.5–15.5)
WBC: 9.7 10*3/uL (ref 4.0–10.5)
nRBC: 0 % (ref 0.0–0.2)

## 2019-09-04 LAB — URINALYSIS, MICROSCOPIC (REFLEX)

## 2019-09-04 LAB — BASIC METABOLIC PANEL
Anion gap: 11 (ref 5–15)
BUN: 35 mg/dL — ABNORMAL HIGH (ref 8–23)
CO2: 24 mmol/L (ref 22–32)
Calcium: 8.4 mg/dL — ABNORMAL LOW (ref 8.9–10.3)
Chloride: 99 mmol/L (ref 98–111)
Creatinine, Ser: 2.13 mg/dL — ABNORMAL HIGH (ref 0.44–1.00)
GFR calc Af Amer: 27 mL/min — ABNORMAL LOW (ref >60)
GFR calc non Af Amer: 23 mL/min — ABNORMAL LOW (ref >60)
Glucose, Bld: 109 mg/dL — ABNORMAL HIGH (ref 70–99)
Potassium: 3.6 mmol/L (ref 3.5–5.1)
Sodium: 134 mmol/L — ABNORMAL LOW (ref 135–145)

## 2019-09-04 LAB — URINALYSIS, ROUTINE W REFLEX MICROSCOPIC
Bilirubin Urine: NEGATIVE
Glucose, UA: NEGATIVE mg/dL
Ketones, ur: NEGATIVE mg/dL
Leukocytes,Ua: NEGATIVE
Nitrite: NEGATIVE
Protein, ur: NEGATIVE mg/dL
Specific Gravity, Urine: 1.01 (ref 1.005–1.030)
pH: 5.5 (ref 5.0–8.0)

## 2019-09-04 LAB — SARS CORONAVIRUS 2 (TAT 6-24 HRS): SARS Coronavirus 2: NEGATIVE

## 2019-09-04 MED ORDER — DIPHENHYDRAMINE HCL 25 MG PO CAPS
25.0000 mg | ORAL_CAPSULE | Freq: Once | ORAL | Status: AC
  Administered 2019-09-04: 25 mg via ORAL
  Filled 2019-09-04: qty 1

## 2019-09-04 MED ORDER — DOXYCYCLINE HYCLATE 100 MG PO TABS
100.0000 mg | ORAL_TABLET | Freq: Once | ORAL | Status: AC
  Administered 2019-09-04: 100 mg via ORAL
  Filled 2019-09-04: qty 1

## 2019-09-04 MED ORDER — SODIUM CHLORIDE 0.9 % IV BOLUS
1000.0000 mL | Freq: Once | INTRAVENOUS | Status: AC
  Administered 2019-09-04: 1000 mL via INTRAVENOUS

## 2019-09-04 MED ORDER — ACETAMINOPHEN 325 MG PO TABS
650.0000 mg | ORAL_TABLET | Freq: Once | ORAL | Status: AC
  Administered 2019-09-04: 650 mg via ORAL
  Filled 2019-09-04: qty 2

## 2019-09-04 MED ORDER — DOXYCYCLINE HYCLATE 100 MG PO CAPS: 100.0000 mg | ORAL_CAPSULE | Freq: Two times a day (BID) | ORAL | 0 refills | Status: DC

## 2019-09-04 NOTE — ED Notes (Signed)
Pt on monitor 

## 2019-09-04 NOTE — Telephone Encounter (Signed)
Pt was called and given information, she verbalized understanding and agreed to go to ED

## 2019-09-04 NOTE — ED Notes (Signed)
Pt unable to urinate at this time.  

## 2019-09-04 NOTE — ED Provider Notes (Signed)
[facility] EMERGENCY DEPARTMENT Provider Note   CSN: [id number] Arrival date & time: 09/04/19  8115     History Chief Complaint  Patient presents with  . Shortness of Breath  . Rash    Kathleen Gibson is a 69 y.o. female.  Presents to ER with concern for cough, shortness of breath, rash.  Over the last couple days has been having generalized fatigue, malaise.  Had been feeling somewhat short of breath as well.  No associated chest pain.  Worse with exertion.  No fevers at home but has had some chills.  Over the past couple days has noted mild rash over her bilateral lower legs.  States that she intermittently gets similar rashes.  Multiple family members tested positive for Covid.  She has had her vaccine.  History diabetes, hyperlipidemia, hypertension.  Prior history of thrombocytopenia, followed by hematology.  HPI     Past Medical History:  Diagnosis Date  . Diabetes ([place])   . HLD (hyperlipidemia)   . Hypertension   . Pernicious anemia   . Vitamin D deficiency     Patient Active Problem List   Diagnosis Date Noted  . Rash and nonspecific skin eruption 04/16/2019  . Rash 08/09/2018  . Statin declined 08/09/2018  . Polyarthralgia 07/31/2018  . Myalgia 07/26/2018  . Weakness 07/26/2018  . Monocytosis 11/07/2017  . Tachycardia 10/25/2017  . Iron deficiency 04/10/2017  . B12 deficiency 04/06/2017  . Hereditary and idiopathic peripheral neuropathy 05/25/2016  . Chronic ITP (idiopathic thrombocytopenia) (HCC) 04/25/2015  . INSOMNIA, CHRONIC 10/06/2009  . Diabetic polyneuropathy ([place]) 11/16/2007  . HLD (hyperlipidemia) 10/05/2007  . ANEMIA, PERNICIOUS 03/02/2007  . Essential hypertension 12/27/2006    Past Surgical History:  Procedure Laterality Date  . ABDOMINAL HERNIA REPAIR  2009  . ABDOMINOPLASTY    . [place]  . GASTRIC BYPASS  2000  . hemorroid surgery  2007  . TONSILLECTOMY AND ADENOIDECTOMY  1957     OB History    Gravida  3   Para  3   Term      Preterm      AB      Living  3     SAB      TAB      Ectopic      Multiple      Live Births              Family History  Problem Relation Age of Onset  . Cancer Mother 33       unknown type cancer   . Hypertension Mother   . Hypertension Father   . Heart failure Father   . Pneumonia Father   . Diabetes Sister   . Leukemia Brother   . Colon cancer Brother 4  . Heart Problems Brother   . Leukemia Other   . Diabetes Sister   . Heart Problems Sister   . Cancer Brother   . Heart Problems Brother     Social History   Tobacco Use  . Smoking status: Never Smoker  . Smokeless tobacco: Never Used  Substance Use Topics  . Alcohol use: No  . Drug use: No    Home Medications Prior to Admission medications   Medication Sig Start Date End Date Taking? Authorizing Provider  ferrous sulfate 325 (65 FE) MG tablet Take 1 tablet (325 mg total) by mouth 2 (two) times daily with a meal. 05/14/19  Yes Kuneff, Renee A, DO  gabapentin (NEURONTIN)  300 MG capsule 900 my QHS and 300 mg every 8 hrs in day 04/15/19  Yes Kuneff, Renee A, DO  glipiZIDE (GLUCOTROL) 5 MG tablet Take 1 tablet (5 mg total) by mouth daily before breakfast. 04/25/19  Yes Kuneff, Renee A, DO  hydrochlorothiazide (HYDRODIURIL) 12.5 MG tablet Take 1 tablet (12.5 mg total) by mouth daily. 04/15/19  Yes Kuneff, Renee A, DO  metoprolol succinate (TOPROL-XL) 50 MG 24 hr tablet Take 1 tablet (50 mg total) by mouth daily. 04/15/19  Yes Kuneff, Renee A, DO  sitaGLIPtin-metformin (JANUMET) 50-1000 MG tablet Take 1 tablet by mouth 2 (two) times daily with a meal.   Yes [provider]  Vitamin D, Cholecalciferol, 25 MCG (1000 UT) CAPS Take by mouth.   Yes [provider]  zolpidem (AMBIEN) 5 MG tablet Take 1 tablet (5 mg total) by mouth at bedtime. 04/15/19  Yes Kuneff, Renee A, DO  doxycycline (VIBRAMYCIN) 100 MG capsule Take 1 capsule (100 mg total) by mouth 2 (two) times daily for  7 days. 09/04/19 09/11/19  Lucrezia Starch, MD  glucose monitoring kit (FREESTYLE) monitoring kit 1 each by Does not apply route as needed for other. Use as directed 02/24/14   Lucille Passy, MD  Omega-3 Fatty Acids (FISH OIL) 1000 MG CAPS Take 1,000 mg by mouth daily.     [provider]    Allergies    Asa [aspirin] and Nsaids  Review of Systems   Review of Systems  Constitutional: Negative for chills and fever.  HENT: Negative for ear pain and sore throat.   Eyes: Negative for pain and visual disturbance.  Respiratory: Positive for cough and shortness of breath.   Cardiovascular: Negative for chest pain and palpitations.  Gastrointestinal: Negative for abdominal pain and vomiting.  Genitourinary: Negative for dysuria and hematuria.  Musculoskeletal: Negative for arthralgias and back pain.  Skin: Positive for color change and rash.  Neurological: Negative for seizures and syncope.  All other systems reviewed and are negative.   Physical Exam Updated Vital Signs BP 113/63   Pulse 91   Temp 98.9 F (37.2 C) (Oral)   Resp (!) 21   Ht '5\' 3"'$  (1.6 m)   Wt 89.9 kg   SpO2 96%   BMI 35.09 kg/m   Physical Exam Vitals and nursing note reviewed.  Constitutional:      General: She is not in acute distress.    Appearance: She is well-developed.  HENT:     Head: Normocephalic and atraumatic.  Eyes:     Conjunctiva/sclera: Conjunctivae normal.  Cardiovascular:     Rate and Rhythm: Normal rate and regular rhythm.     Heart sounds: No murmur.  Pulmonary:     Effort: Pulmonary effort is normal. No respiratory distress.     Breath sounds: Normal breath sounds.  Abdominal:     Palpations: Abdomen is soft.     Tenderness: There is no abdominal tenderness.  Musculoskeletal:     Cervical back: Neck supple.  Skin:    General: Skin is warm and dry.     Capillary Refill: Capillary refill takes less than 2 seconds.     Comments: B/l lower legs scattered areas of slightly  raised blanchable maculopapular rash; also occasional non blanchable macule, no warmth to touch, no purpura, normal joint ROM  Neurological:     General: No focal deficit present.     Mental Status: She is alert and oriented to person, place, and time.  Psychiatric:  Mood and Affect: Mood normal.        Behavior: Behavior normal.     ED Results / Procedures / Treatments   Labs (all labs ordered are listed, but only abnormal results are displayed) Labs Reviewed  CBC WITH DIFFERENTIAL/PLATELET - Abnormal; Notable for the following components:      Result Value   RBC 3.56 (*)    Hemoglobin 10.5 (*)    HCT 31.7 (*)    Platelets 68 (*)    Monocytes Absolute 2.1 (*)    Abs Immature Granulocytes 0.59 (*)    All other components within normal limits  BASIC METABOLIC PANEL - Abnormal; Notable for the following components:   Sodium 134 (*)    Glucose, Bld 109 (*)    BUN 35 (*)    Creatinine, Ser 2.13 (*)    Calcium 8.4 (*)    GFR calc non Af Amer 23 (*)    GFR calc Af Amer 27 (*)    All other components within normal limits  URINALYSIS, ROUTINE W REFLEX MICROSCOPIC - Abnormal; Notable for the following components:   Hgb urine dipstick TRACE (*)    All other components within normal limits  URINALYSIS, MICROSCOPIC (REFLEX) - Abnormal; Notable for the following components:   Bacteria, UA FEW (*)    All other components within normal limits  SARS CORONAVIRUS 2 (TAT 6-24 HRS)  PATHOLOGIST SMEAR REVIEW    EKG EKG Interpretation  Date/Time:  Wednesday September 04 2019 10:04:36 EDT Ventricular Rate:  102 PR Interval:    QRS Duration: 76 QT Interval:  334 QTC Calculation: 435 R Axis:   68 Text Interpretation: Sinus tachycardia Low voltage, precordial leads Borderline T abnormalities, inferior leads Confirmed by Madalyn Rob [id number]) on 09/04/2019 10:32:14 AM   Radiology DG Chest Portable 1 View  Result Date: 09/04/2019 CLINICAL DATA:  Cough and fever EXAM: PORTABLE CHEST  1 VIEW COMPARISON:  July 24, 2018 FINDINGS: There is slight atelectasis in the left base. The lungs elsewhere are clear. Heart size and pulmonary vascularity are normal. No adenopathy. No bone lesions. IMPRESSION: Focal lateral left base atelectasis. Lungs elsewhere clear. Cardiac silhouette within normal limits. No adenopathy. Electronically Signed   By: Lowella Grip III M.D.   On: 09/04/2019 11:17    Procedures Procedures (including critical care time)  Medications Ordered in ED Medications  acetaminophen (TYLENOL) tablet 650 mg (650 mg Oral Given 09/04/19 1120)  diphenhydrAMINE (BENADRYL) capsule 25 mg (25 mg Oral Given 09/04/19 1120)  sodium chloride 0.9 % bolus 1,000 mL (0 mLs Intravenous Stopped 09/04/19 1347)  doxycycline (VIBRA-TABS) tablet 100 mg (100 mg Oral Given 09/04/19 1324)    ED Course  I have reviewed the triage vital signs and the nursing notes.  Pertinent labs & imaging results that were available during my care of the patient were reviewed by me and considered in my medical decision making (see chart for details).    MDM Rules/Calculators/A&P                      69 year old lady who presents to ER with complaint of fatigue, shortness of breath, rash.  On exam patient is well-appearing, stable vital signs, no hypoxia.  Initial temperature borderline fever.  Covid exposure, given symptoms suspect possible Covid.  Outside rapid test reportedly negative.  Sent for COVID-19.  CXR with concern for possible left lower lobe pneumonia.  Will start on course of doxycycline.  Blood work concerning for thrombocytopenia, AKI.  Regarding thrombocytopenia, patient has known history of this, has been followed by hematology previously.  No other significant changes in blood counts today.  Creatinine baseline 0.8, now 2.1, elevated BUN to 35.  Suspect dehydration most likely etiology.  Provided fluids.  Most of the reactions seem to be urticarial in nature, responded to Benadryl.   There were a couple small areas that were nonblanchable and may be related to her thrombocytopenia.  Did not appear cellulitic.  No purpura.  Discussed all these findings with patient, discussed and offered admission for further hydration and monitoring.  Patient states she strongly desires to go home if possible.  Given her stable vital signs, tolerating p.o., making regular urine, believe this is a reasonable option.  Patient already has appointment with PCP on Friday.  Stressed importance of follow-up on Friday and need for repeat lab testing.  Patient acknowledged these instructions, has family staying with her that can assist.  Discharged home in stable condition.    After the discussed management above, the patient was determined to be safe for discharge.  The patient was in agreement with this plan and all questions regarding their care were answered.  ED return precautions were discussed and the patient will return to the ED with any significant worsening of condition.   Final Clinical Impression(s) / ED Diagnoses Final diagnoses:  Community acquired pneumonia of left lung, unspecified part of lung  AKI (acute kidney injury) ([place])  Thrombocytopenia ([place])    Rx / DC Orders ED Discharge Orders         Ordered    doxycycline (VIBRAMYCIN) 100 MG capsule  2 times daily     09/04/19 1451           Lucrezia Starch, MD 09/04/19 1507

## 2019-09-04 NOTE — Telephone Encounter (Addendum)
I understand she may not want to go to ED. However, with her low grade fever and reported complaint she really needs to go to ED for emergent evaluation. There are multiple possible causes to her symptoms and she needs emergent labs and imaging completed. These are not tests than can be completed in our office emergently.

## 2019-09-04 NOTE — Telephone Encounter (Signed)
Patient feels very weak and tired. She has what looks like a hematoma on her toe that seems to be moving. It does not hurt just "looks awful". Patient went & had a COVID test yesterday since it presents so many different symptoms. Her test was negative. Patient is requesting a same day appt with PCP if possible.

## 2019-09-04 NOTE — ED Triage Notes (Signed)
SOB started this morning, red rashes to BLE.  She has been tested for Covid yesterday at [facility], negative.  Patient stated that her daughter, son-in-law and 2 granddaughters are positive for Covid.

## 2019-09-04 NOTE — Telephone Encounter (Signed)
Started 09/02/2019 and is purple/reddish in color on great toe, R foot. Denies injury. Patient states she is having joint pain everywhere. Low grade fever this morning. COVID test was negative. Pts daughters family has COVID over the past 3 weeks but she says she has not been around them besides to drop groceries off outside. Pt marked it with Sharpie and she states it has not moved but it is spreading. Now she has small little pin like dots around the larger bruised area that is on both legs. Pt is very thirsty and very weak. Pt is short of breath but feels that is due to the pain.   Pt has not been to Dr Burr Medico in a long time because she feels they do not do anything when she does go. Pt was advised she probably needed to go to ED but she did not want to do that and wanted Dr Raoul Pitch to know what was going on first.

## 2019-09-04 NOTE — Discharge Instructions (Addendum)
Please follow-up with your primary doctor as previously scheduled.  You should have your blood work repeated ideally on Friday as well to monitor your blood counts and kidney function.  Please take antibiotic as prescribed.  If you develop any difficulty in breathing, recurrent fever, abdominal pain, worsening rash or vomiting, please return to ER for reassessment.  Additionally recommend follow-up with hematology regarding your abnormal blood counts.

## 2019-09-05 ENCOUNTER — Emergency Department (HOSPITAL_BASED_OUTPATIENT_CLINIC_OR_DEPARTMENT_OTHER): Payer: Medicare HMO

## 2019-09-05 ENCOUNTER — Other Ambulatory Visit: Payer: Self-pay

## 2019-09-05 ENCOUNTER — Inpatient Hospital Stay (HOSPITAL_BASED_OUTPATIENT_CLINIC_OR_DEPARTMENT_OTHER)
Admission: EM | Admit: 2019-09-05 | Discharge: 2019-09-07 | DRG: 194 | Disposition: A | Discharge status: Home / Self Care | Payer: Medicare HMO | Attending: Internal Medicine | Admitting: Internal Medicine

## 2019-09-05 ENCOUNTER — Encounter (HOSPITAL_BASED_OUTPATIENT_CLINIC_OR_DEPARTMENT_OTHER): Payer: Self-pay

## 2019-09-05 DIAGNOSIS — E119 Type 2 diabetes mellitus without complications: Secondary | ICD-10-CM

## 2019-09-05 DIAGNOSIS — A419 Sepsis, unspecified organism: Secondary | ICD-10-CM

## 2019-09-05 DIAGNOSIS — N179 Acute kidney failure, unspecified: Secondary | ICD-10-CM | POA: Diagnosis not present

## 2019-09-05 DIAGNOSIS — Z79899 Other long term (current) drug therapy: Secondary | ICD-10-CM | POA: Diagnosis not present

## 2019-09-05 DIAGNOSIS — Z9884 Bariatric surgery status: Secondary | ICD-10-CM | POA: Diagnosis not present

## 2019-09-05 DIAGNOSIS — K8020 Calculus of gallbladder without cholecystitis without obstruction: Secondary | ICD-10-CM | POA: Diagnosis not present

## 2019-09-05 DIAGNOSIS — E785 Hyperlipidemia, unspecified: Secondary | ICD-10-CM | POA: Diagnosis present

## 2019-09-05 DIAGNOSIS — Z8249 Family history of ischemic heart disease and other diseases of the circulatory system: Secondary | ICD-10-CM

## 2019-09-05 DIAGNOSIS — R000 Tachycardia, unspecified: Secondary | ICD-10-CM | POA: Diagnosis not present

## 2019-09-05 DIAGNOSIS — N289 Disorder of kidney and ureter, unspecified: Secondary | ICD-10-CM

## 2019-09-05 DIAGNOSIS — D693 Immune thrombocytopenic purpura: Secondary | ICD-10-CM | POA: Diagnosis present

## 2019-09-05 DIAGNOSIS — I10 Essential (primary) hypertension: Secondary | ICD-10-CM | POA: Diagnosis present

## 2019-09-05 DIAGNOSIS — R21 Rash and other nonspecific skin eruption: Secondary | ICD-10-CM

## 2019-09-05 DIAGNOSIS — J189 Pneumonia, unspecified organism: Secondary | ICD-10-CM | POA: Diagnosis not present

## 2019-09-05 DIAGNOSIS — Z20822 Contact with and (suspected) exposure to covid-19: Secondary | ICD-10-CM | POA: Diagnosis present

## 2019-09-05 DIAGNOSIS — Z7984 Long term (current) use of oral hypoglycemic drugs: Secondary | ICD-10-CM | POA: Diagnosis not present

## 2019-09-05 DIAGNOSIS — E1142 Type 2 diabetes mellitus with diabetic polyneuropathy: Secondary | ICD-10-CM

## 2019-09-05 DIAGNOSIS — R0602 Shortness of breath: Secondary | ICD-10-CM | POA: Diagnosis not present

## 2019-09-05 DIAGNOSIS — R05 Cough: Secondary | ICD-10-CM | POA: Diagnosis not present

## 2019-09-05 HISTORY — DX: Pneumonia, unspecified organism: J18.9

## 2019-09-05 LAB — CBC WITH DIFFERENTIAL/PLATELET
Abs Immature Granulocytes: 0.61 10*3/uL — ABNORMAL HIGH (ref 0.00–0.07)
Basophils Absolute: 0 10*3/uL (ref 0.0–0.1)
Basophils Relative: 0 %
Eosinophils Absolute: 0 10*3/uL (ref 0.0–0.5)
Eosinophils Relative: 0 %
HCT: 34 % — ABNORMAL LOW (ref 36.0–46.0)
Hemoglobin: 11.1 g/dL — ABNORMAL LOW (ref 12.0–15.0)
Immature Granulocytes: 7 %
Lymphocytes Relative: 12 %
Lymphs Abs: 1.1 10*3/uL (ref 0.7–4.0)
MCH: 28.8 pg (ref 26.0–34.0)
MCHC: 32.6 g/dL (ref 30.0–36.0)
MCV: 88.3 fL (ref 80.0–100.0)
Monocytes Absolute: 1.8 10*3/uL — ABNORMAL HIGH (ref 0.1–1.0)
Monocytes Relative: 20 %
Neutro Abs: 5.4 10*3/uL (ref 1.7–7.7)
Neutrophils Relative %: 61 %
Platelets: 83 10*3/uL — ABNORMAL LOW (ref 150–400)
RBC: 3.85 MIL/uL — ABNORMAL LOW (ref 3.87–5.11)
RDW: 13.6 % (ref 11.5–15.5)
WBC Morphology: ABNORMAL
WBC: 8.9 10*3/uL (ref 4.0–10.5)
nRBC: 0 % (ref 0.0–0.2)

## 2019-09-05 LAB — COMPREHENSIVE METABOLIC PANEL
ALT: 25 U/L (ref 0–44)
AST: 28 U/L (ref 15–41)
Albumin: 3.4 g/dL — ABNORMAL LOW (ref 3.5–5.0)
Alkaline Phosphatase: 60 U/L (ref 38–126)
Anion gap: 12 (ref 5–15)
BUN: 33 mg/dL — ABNORMAL HIGH (ref 8–23)
CO2: 24 mmol/L (ref 22–32)
Calcium: 8.6 mg/dL — ABNORMAL LOW (ref 8.9–10.3)
Chloride: 100 mmol/L (ref 98–111)
Creatinine, Ser: 1.71 mg/dL — ABNORMAL HIGH (ref 0.44–1.00)
GFR calc Af Amer: 35 mL/min — ABNORMAL LOW (ref >60)
GFR calc non Af Amer: 30 mL/min — ABNORMAL LOW (ref >60)
Glucose, Bld: 206 mg/dL — ABNORMAL HIGH (ref 70–99)
Potassium: 3.6 mmol/L (ref 3.5–5.1)
Sodium: 136 mmol/L (ref 135–145)
Total Bilirubin: 0.8 mg/dL (ref 0.3–1.2)
Total Protein: 8.3 g/dL — ABNORMAL HIGH (ref 6.5–8.1)

## 2019-09-05 LAB — PROTIME-INR
INR: 1.1 (ref 0.8–1.2)
Prothrombin Time: 14 seconds (ref 11.4–15.2)

## 2019-09-05 LAB — URINALYSIS, ROUTINE W REFLEX MICROSCOPIC
Bilirubin Urine: NEGATIVE
Glucose, UA: NEGATIVE mg/dL
Ketones, ur: NEGATIVE mg/dL
Leukocytes,Ua: NEGATIVE
Nitrite: NEGATIVE
Protein, ur: 30 mg/dL — AB
Specific Gravity, Urine: 1.01 (ref 1.005–1.030)
pH: 5.5 (ref 5.0–8.0)

## 2019-09-05 LAB — URINALYSIS, MICROSCOPIC (REFLEX)

## 2019-09-05 LAB — PATHOLOGIST SMEAR REVIEW

## 2019-09-05 LAB — APTT: aPTT: 36 seconds (ref 24–36)

## 2019-09-05 LAB — LACTIC ACID, PLASMA: Lactic Acid, Venous: 1.3 mmol/L (ref 0.5–1.9)

## 2019-09-05 MED ORDER — ACETAMINOPHEN 325 MG PO TABS
650.0000 mg | ORAL_TABLET | Freq: Once | ORAL | Status: AC
  Administered 2019-09-05: 650 mg via ORAL
  Filled 2019-09-05: qty 2

## 2019-09-05 MED ORDER — SODIUM CHLORIDE 0.9 % IV SOLN
500.0000 mg | INTRAVENOUS | Status: DC
  Administered 2019-09-05: 500 mg via INTRAVENOUS
  Filled 2019-09-05: qty 500

## 2019-09-05 MED ORDER — SODIUM CHLORIDE 0.9 % IV SOLN
2.0000 g | INTRAVENOUS | Status: DC
  Administered 2019-09-05: 2 g via INTRAVENOUS
  Filled 2019-09-05: qty 20

## 2019-09-05 MED ORDER — SODIUM CHLORIDE 0.9 % IV BOLUS (SEPSIS)
1000.0000 mL | Freq: Once | INTRAVENOUS | Status: AC
  Administered 2019-09-05: 1000 mL via INTRAVENOUS

## 2019-09-05 NOTE — Progress Notes (Signed)
Pt with vit B12 deficiency.  Agree with vit B12 1000 mcg IM in office today. Signed:  Phil Ramzey Petrovic, MD           09/05/2019  

## 2019-09-05 NOTE — ED Notes (Signed)
Attempted IV x2 without success in right wrist and left AC. Second RN to attempt

## 2019-09-05 NOTE — ED Triage Notes (Signed)
Pt states she was seen yesterday-dx with [facility]-states she was offered admn but declined and she feels worse-to tx area via w/c

## 2019-09-05 NOTE — ED Provider Notes (Signed)
[facility] EMERGENCY DEPARTMENT Provider Note   CSN: [id number] Arrival date & time: 09/05/19  1954     History Chief Complaint  Patient presents with  . Pneumonia  . Fever  . Abdominal Pain    Kathleen Gibson is a 69 y.o. female.  Patient seen here yesterday and diagnosed with pneumonia.  Had been offered admission due to elevated kidney function.  Patient was feeling better and wanted to go home.  Started on doxycycline.  However continues to have fever, poor p.o. intake.  Started to have some left lower quadrant abdominal pain as well today.  Negative Covid test yesterday.  Fully vaccinated for coronavirus.  The history is provided by the patient.  Fever Temp source:  Oral Severity:  Moderate Onset quality:  Gradual Duration:  4 days Timing:  Intermittent Progression:  Waxing and waning Chronicity:  New Relieved by:  Nothing Worsened by:  Nothing Associated symptoms: cough   Associated symptoms: no chest pain, no chills, no dysuria, no ear pain, no rash, no sore throat and no vomiting        Past Medical History:  Diagnosis Date  . Diabetes ([place])   . HLD (hyperlipidemia)   . Hypertension   . Pernicious anemia   . Vitamin D deficiency     Patient Active Problem List   Diagnosis Date Noted  . Rash and nonspecific skin eruption 04/16/2019  . Rash 08/09/2018  . Statin declined 08/09/2018  . Polyarthralgia 07/31/2018  . Myalgia 07/26/2018  . Weakness 07/26/2018  . Monocytosis 11/07/2017  . Tachycardia 10/25/2017  . Iron deficiency 04/10/2017  . B12 deficiency 04/06/2017  . Hereditary and idiopathic peripheral neuropathy 05/25/2016  . Chronic ITP (idiopathic thrombocytopenia) ([facility]) 04/25/2015  . INSOMNIA, CHRONIC 10/06/2009  . Diabetic polyneuropathy ([place]) 11/16/2007  . HLD (hyperlipidemia) 10/05/2007  . ANEMIA, PERNICIOUS 03/02/2007  . Essential hypertension 12/27/2006    Past Surgical History:  Procedure Laterality Date  . ABDOMINAL HERNIA  REPAIR  2009  . ABDOMINOPLASTY    . [place]  . GASTRIC BYPASS  2000  . hemorroid surgery  2007  . TONSILLECTOMY AND ADENOIDECTOMY  1957     OB History    Gravida  3   Para  3   Term      Preterm      AB      Living  3     SAB      TAB      Ectopic      Multiple      Live Births              Family History  Problem Relation Age of Onset  . Cancer Mother 75       unknown type cancer   . Hypertension Mother   . Hypertension Father   . Heart failure Father   . Pneumonia Father   . Diabetes Sister   . Leukemia Brother   . Colon cancer Brother 77  . Heart Problems Brother   . Leukemia Other   . Diabetes Sister   . Heart Problems Sister   . Cancer Brother   . Heart Problems Brother     Social History   Tobacco Use  . Smoking status: Never Smoker  . Smokeless tobacco: Never Used  Substance Use Topics  . Alcohol use: No  . Drug use: No    Home Medications Prior to Admission medications   Medication Sig Start Date End Date Taking?  Authorizing Provider  doxycycline (VIBRAMYCIN) 100 MG capsule Take 1 capsule (100 mg total) by mouth 2 (two) times daily for 7 days. 09/04/19 09/11/19  Lucrezia Starch, MD  ferrous sulfate 325 (65 FE) MG tablet Take 1 tablet (325 mg total) by mouth 2 (two) times daily with a meal. 05/14/19   Kuneff, Renee A, DO  gabapentin (NEURONTIN) 300 MG capsule 900 my QHS and 300 mg every 8 hrs in day 04/15/19   Kuneff, Renee A, DO  glipiZIDE (GLUCOTROL) 5 MG tablet Take 1 tablet (5 mg total) by mouth daily before breakfast. 04/25/19   Kuneff, Renee A, DO  glucose monitoring kit (FREESTYLE) monitoring kit 1 each by Does not apply route as needed for other. Use as directed 02/24/14   Lucille Passy, MD  hydrochlorothiazide (HYDRODIURIL) 12.5 MG tablet Take 1 tablet (12.5 mg total) by mouth daily. 04/15/19   Kuneff, Renee A, DO  metoprolol succinate (TOPROL-XL) 50 MG 24 hr tablet Take 1 tablet (50 mg total) by mouth daily.  04/15/19   Kuneff, Renee A, DO  Omega-3 Fatty Acids (FISH OIL) 1000 MG CAPS Take 1,000 mg by mouth daily.     [provider]  sitaGLIPtin-metformin (JANUMET) 50-1000 MG tablet Take 1 tablet by mouth 2 (two) times daily with a meal.    [provider]  Vitamin D, Cholecalciferol, 25 MCG (1000 UT) CAPS Take by mouth.    [provider]  zolpidem (AMBIEN) 5 MG tablet Take 1 tablet (5 mg total) by mouth at bedtime. 04/15/19   Kuneff, Renee A, DO    Allergies    Asa [aspirin] and Nsaids  Review of Systems   Review of Systems  Constitutional: Positive for fever. Negative for chills.  HENT: Negative for ear pain and sore throat.   Eyes: Negative for pain and visual disturbance.  Respiratory: Positive for cough. Negative for shortness of breath.   Cardiovascular: Negative for chest pain and palpitations.  Gastrointestinal: Positive for abdominal pain. Negative for vomiting.  Genitourinary: Negative for dysuria and hematuria.  Musculoskeletal: Negative for arthralgias and back pain.  Skin: Negative for color change and rash.  Neurological: Negative for seizures and syncope.  All other systems reviewed and are negative.   Physical Exam Updated Vital Signs  ED Triage Vitals  Enc Vitals Group     BP 09/05/19 2005 (!) 136/110     Pulse Rate 09/05/19 2005 (!) 117     Resp 09/05/19 2005 (!) 24     Temp 09/05/19 2005 (!) 101.6 F (38.7 C)     Temp Source 09/05/19 2005 Oral     SpO2 09/05/19 2005 95 %     Weight --      Height --      Head Circumference --      Peak Flow --      Pain Score 09/05/19 2004 8     Pain Loc --      Pain Edu? --      Excl. in [place]? --     Physical Exam Vitals and nursing note reviewed.  Constitutional:      General: She is not in acute distress.    Appearance: She is well-developed. She is ill-appearing.  HENT:     Head: Normocephalic and atraumatic.     Comments: Dry mucous membranes Eyes:     Extraocular Movements:  Extraocular movements intact.     Conjunctiva/sclera: Conjunctivae normal.  Cardiovascular:     Rate and Rhythm: Normal  rate and regular rhythm.     Heart sounds: Normal heart sounds. No murmur.  Pulmonary:     Effort: Pulmonary effort is normal. No respiratory distress.     Breath sounds: Normal breath sounds.  Abdominal:     General: There is no distension.     Palpations: Abdomen is soft.     Tenderness: There is abdominal tenderness in the suprapubic area and left lower quadrant. There is no right CVA tenderness, left CVA tenderness, guarding or rebound. Negative signs include Murphy's sign, Rovsing's sign and McBurney's sign.     Hernia: A hernia is present. Hernia is present in the ventral area.  Musculoskeletal:     Cervical back: Neck supple.  Skin:    General: Skin is warm and dry.     Capillary Refill: Capillary refill takes less than 2 seconds.  Neurological:     General: No focal deficit present.     Mental Status: She is alert.     ED Results / Procedures / Treatments   Labs (all labs ordered are listed, but only abnormal results are displayed) Labs Reviewed  COMPREHENSIVE METABOLIC PANEL - Abnormal; Notable for the following components:      Result Value   Glucose, Bld 206 (*)    BUN 33 (*)    Creatinine, Ser 1.71 (*)    Calcium 8.6 (*)    Total Protein 8.3 (*)    Albumin 3.4 (*)    GFR calc non Af Amer 30 (*)    GFR calc Af Amer 35 (*)    All other components within normal limits  CBC WITH DIFFERENTIAL/PLATELET - Abnormal; Notable for the following components:   RBC 3.85 (*)    Hemoglobin 11.1 (*)    HCT 34.0 (*)    Platelets 83 (*)    Monocytes Absolute 1.8 (*)    Abs Immature Granulocytes 0.61 (*)    All other components within normal limits  CULTURE, BLOOD (ROUTINE X 2)  CULTURE, BLOOD (ROUTINE X 2)  URINE CULTURE  LACTIC ACID, PLASMA  APTT  PROTIME-INR  LACTIC ACID, PLASMA  URINALYSIS, ROUTINE W REFLEX MICROSCOPIC    EKG EKG  Interpretation  Date/Time:  Thursday September 05 2019 20:25:25 EDT Ventricular Rate:  117 PR Interval:    QRS Duration: 77 QT Interval:  319 QTC Calculation: 445 R Axis:   83 Text Interpretation: Sinus tachycardia Borderline right axis deviation Borderline T abnormalities, inferior leads Confirmed by Lennice Sites [phone]) on 09/05/2019 8:42:44 PM   Radiology CT ABDOMEN PELVIS WO CONTRAST  Result Date: 09/05/2019 CLINICAL DATA:  Left lower quadrant pain with abdominal distention and fever. EXAM: CT ABDOMEN AND PELVIS WITHOUT CONTRAST TECHNIQUE: Multidetector CT imaging of the abdomen and pelvis was performed following the standard protocol without IV contrast. COMPARISON:  08/21/2015 FINDINGS: Lower chest: Visualized lung bases are clear. Hepatobiliary: Minimal cholelithiasis. Subcentimeter calcification along the posterior border of the liver. Biliary tree is normal. Pancreas: Normal. Spleen: Normal. Adrenals/Urinary Tract: Adrenal glands are normal. Kidneys normal size. No significant hydronephrosis. Couple possible small parapelvic left renal cysts. Ureters and bladder are normal. Stomach/Bowel: Evidence of previous gastric bypass surgery. Small bowel is unremarkable. Mild diverticulosis of the colon. Appendix is normal. Vascular/Lymphatic: Mild calcified plaque over the abdominal aorta which is normal in caliber. There is been a significant interval increase in size and number of lymph nodes over the mesentery, celiac axis and periaortic region as well as along the iliac chains. Most prominent lymph nodes are along  the celiac axis measuring up to 1.7 cm by short axis. Reproductive: Unremarkable. Other: No free fluid or focal inflammatory change. Small periumbilical hernia containing only peritoneal fat unchanged. There are a few nonspecific peritoneal calcifications unchanged. Musculoskeletal: No focal abnormality. IMPRESSION: 1.  No acute findings in the abdomen/pelvis. 2. Interval worsening  adenopathy over the upper abdomen, mesentery, periaortic region and iliac chains as described. Adenopathy is worse over the region of the celiac axis with lymph nodes measuring up to 1.8 cm by short axis. Findings are concerning for a neoplastic process. 3.  Cholelithiasis. 4.  Mild diverticulosis of the colon. 5. Small periumbilical hernia containing only peritoneal fat unchanged. 6.  Aortic Atherosclerosis (ICD10-I70.0). Electronically Signed   By: Marin Olp M.D.   On: 09/05/2019 21:01   DG Chest Port 1 View  Result Date: 09/05/2019 CLINICAL DATA:  Cough, shortness of breath EXAM: PORTABLE CHEST 1 VIEW COMPARISON:  09/04/2019 FINDINGS: Heart is normal size. Left perihilar and infrahilar airspace opacity slightly increased since prior study concerning for possible pneumonia. Right lung clear. No effusions or acute bony abnormality. IMPRESSION: Left perihilar airspace opacity, question pneumonia. Electronically Signed   By: Rolm Baptise M.D.   On: 09/05/2019 20:42   DG Chest Portable 1 View  Result Date: 09/04/2019 CLINICAL DATA:  Cough and fever EXAM: PORTABLE CHEST 1 VIEW COMPARISON:  July 24, 2018 FINDINGS: There is slight atelectasis in the left base. The lungs elsewhere are clear. Heart size and pulmonary vascularity are normal. No adenopathy. No bone lesions. IMPRESSION: Focal lateral left base atelectasis. Lungs elsewhere clear. Cardiac silhouette within normal limits. No adenopathy. Electronically Signed   By: Lowella Grip III M.D.   On: 09/04/2019 11:17    Procedures .Critical Care Performed by: Lennice Sites, DO Authorized by: Lennice Sites, DO   Critical care provider statement:    Critical care time (minutes):  45   Critical care was necessary to treat or prevent imminent or life-threatening deterioration of the following conditions:  Sepsis   Critical care was time spent personally by me on the following activities:  Blood draw for specimens, development of treatment  plan with patient or surrogate, discussions with primary provider, evaluation of patient's response to treatment, examination of patient, obtaining history from patient or surrogate, ordering and performing treatments and interventions, ordering and review of laboratory studies, ordering and review of radiographic studies, pulse oximetry, re-evaluation of patient's condition and review of old charts   I assumed direction of critical care for this patient from another provider in my specialty: no     (including critical care time)  Medications Ordered in ED Medications  cefTRIAXone (ROCEPHIN) 2 g in sodium chloride 0.9 % 100 mL IVPB (0 g Intravenous Stopped 09/05/19 2200)  azithromycin (ZITHROMAX) 500 mg in sodium chloride 0.9 % 250 mL IVPB (500 mg Intravenous New Bag/Given 09/05/19 2117)  sodium chloride 0.9 % bolus 1,000 mL (0 mLs Intravenous Stopped 09/05/19 2200)  acetaminophen (TYLENOL) tablet 650 mg (650 mg Oral Given 09/05/19 2103)    ED Course  I have reviewed the triage vital signs and the nursing notes.  Pertinent labs & imaging results that were available during my care of the patient were reviewed by me and considered in my medical decision making (see chart for details).    MDM Rules/Calculators/A&P                      DECARLA SIEMEN is a 69 year old female with history of  ITP, high cholesterol presents the ED with fever, cough, abdominal pain.  Patient diagnosed with pneumonia yesterday.  Had elevated kidney function.  However felt well enough to go home.  Patient returns with fever, tachycardia.  Patient is tachypneic.  However on room air.  Covid test was negative yesterday.  Chest x-ray continues to show pneumonia.  Sepsis work-up was initiated.  Patient given normal saline bolus, IV Rocephin, IV Zithromax.  Lactic acid was normal.  White count normal.  Blood pressure stable.  Obtain a CT scan of her abdomen pelvis due to new left lower quadrant abdominal pain.  CT scan did not show  any acute findings however there is interval worsening of adenopathy over the upper abdomen as well as mesentery, periaortic region and iliac chains.  Concerning for possibly neoplastic process.  Overall patient appears to be septic from pneumonia.  We will have her admitted to hospitalist for further care.  May need further neoplastic work-up as well.  This chart was dictated using voice recognition software.  Despite best efforts to proofread,  errors can occur which can change the documentation meaning.    Final Clinical Impression(s) / ED Diagnoses Final diagnoses:  Sepsis, due to unspecified organism, unspecified whether acute organ dysfunction present [facility])  Community acquired pneumonia, unspecified laterality    Rx / DC Orders ED Discharge Orders    None       Lennice Sites, DO 09/05/19 2225

## 2019-09-06 ENCOUNTER — Ambulatory Visit: Payer: Medicare HMO | Admitting: Family Medicine

## 2019-09-06 ENCOUNTER — Encounter (HOSPITAL_COMMUNITY): Payer: Self-pay | Admitting: Family Medicine

## 2019-09-06 DIAGNOSIS — N289 Disorder of kidney and ureter, unspecified: Secondary | ICD-10-CM

## 2019-09-06 HISTORY — DX: Disorder of kidney and ureter, unspecified: N28.9

## 2019-09-06 LAB — CBC WITH DIFFERENTIAL/PLATELET
Abs Immature Granulocytes: 0.6 10*3/uL — ABNORMAL HIGH (ref 0.00–0.07)
Basophils Absolute: 0 10*3/uL (ref 0.0–0.1)
Basophils Relative: 0 %
Eosinophils Absolute: 0 10*3/uL (ref 0.0–0.5)
Eosinophils Relative: 0 %
HCT: 29.7 % — ABNORMAL LOW (ref 36.0–46.0)
Hemoglobin: 9.6 g/dL — ABNORMAL LOW (ref 12.0–15.0)
Immature Granulocytes: 7 %
Lymphocytes Relative: 10 %
Lymphs Abs: 1 10*3/uL (ref 0.7–4.0)
MCH: 28.8 pg (ref 26.0–34.0)
MCHC: 32.3 g/dL (ref 30.0–36.0)
MCV: 89.2 fL (ref 80.0–100.0)
Monocytes Absolute: 2 10*3/uL — ABNORMAL HIGH (ref 0.1–1.0)
Monocytes Relative: 21 %
Neutro Abs: 5.7 10*3/uL (ref 1.7–7.7)
Neutrophils Relative %: 62 %
Platelets: 90 10*3/uL — ABNORMAL LOW (ref 150–400)
RBC: 3.33 MIL/uL — ABNORMAL LOW (ref 3.87–5.11)
RDW: 13.4 % (ref 11.5–15.5)
WBC: 9.2 10*3/uL (ref 4.0–10.5)
nRBC: 0 % (ref 0.0–0.2)

## 2019-09-06 LAB — HEPATITIS PANEL, ACUTE
HCV Ab: NONREACTIVE
Hep A IgM: NONREACTIVE
Hep B C IgM: NONREACTIVE
Hepatitis B Surface Ag: NONREACTIVE

## 2019-09-06 LAB — SEDIMENTATION RATE: Sed Rate: 97 mm/hr — ABNORMAL HIGH (ref 0–22)

## 2019-09-06 LAB — PROCALCITONIN: Procalcitonin: 1.7 ng/mL

## 2019-09-06 LAB — GLUCOSE, CAPILLARY
Glucose-Capillary: 138 mg/dL — ABNORMAL HIGH (ref 70–99)
Glucose-Capillary: 141 mg/dL — ABNORMAL HIGH (ref 70–99)
Glucose-Capillary: 163 mg/dL — ABNORMAL HIGH (ref 70–99)
Glucose-Capillary: 110 mg/dL — ABNORMAL HIGH (ref 70–99)
Glucose-Capillary: 109 mg/dL — ABNORMAL HIGH (ref 70–99)

## 2019-09-06 LAB — BASIC METABOLIC PANEL
Anion gap: 11 (ref 5–15)
BUN: 25 mg/dL — ABNORMAL HIGH (ref 8–23)
CO2: 24 mmol/L (ref 22–32)
Calcium: 8.2 mg/dL — ABNORMAL LOW (ref 8.9–10.3)
Chloride: 105 mmol/L (ref 98–111)
Creatinine, Ser: 1.55 mg/dL — ABNORMAL HIGH (ref 0.44–1.00)
GFR calc Af Amer: 39 mL/min — ABNORMAL LOW (ref >60)
GFR calc non Af Amer: 34 mL/min — ABNORMAL LOW (ref >60)
Glucose, Bld: 157 mg/dL — ABNORMAL HIGH (ref 70–99)
Potassium: 3.4 mmol/L — ABNORMAL LOW (ref 3.5–5.1)
Sodium: 140 mmol/L (ref 135–145)

## 2019-09-06 LAB — C-REACTIVE PROTEIN: CRP: 7.4 mg/dL — ABNORMAL HIGH (ref <1.0)

## 2019-09-06 LAB — SARS CORONAVIRUS 2 (TAT 6-24 HRS): SARS Coronavirus 2: NEGATIVE

## 2019-09-06 LAB — HEMOGLOBIN A1C
Hgb A1c MFr Bld: 7.1 % — ABNORMAL HIGH (ref 4.8–5.6)
Mean Plasma Glucose: 157.07 mg/dL

## 2019-09-06 MED ORDER — POTASSIUM CHLORIDE CRYS ER 20 MEQ PO TBCR
40.0000 meq | EXTENDED_RELEASE_TABLET | Freq: Two times a day (BID) | ORAL | Status: DC
  Administered 2019-09-06 – 2019-09-07 (×4): 40 meq via ORAL
  Filled 2019-09-06 (×3): qty 2

## 2019-09-06 MED ORDER — ZOLPIDEM TARTRATE 5 MG PO TABS
5.0000 mg | ORAL_TABLET | Freq: Every evening | ORAL | Status: DC | PRN
  Administered 2019-09-06: 5 mg via ORAL
  Filled 2019-09-06: qty 1

## 2019-09-06 MED ORDER — INSULIN ASPART 100 UNIT/ML ~~LOC~~ SOLN
0.0000 [IU] | Freq: Three times a day (TID) | SUBCUTANEOUS | Status: DC
  Administered 2019-09-06: 2 [IU] via SUBCUTANEOUS
  Administered 2019-09-06 – 2019-09-07 (×2): 1 [IU] via SUBCUTANEOUS

## 2019-09-06 MED ORDER — METOPROLOL SUCCINATE ER 25 MG PO TB24
25.0000 mg | ORAL_TABLET | Freq: Every day | ORAL | Status: DC
  Administered 2019-09-06 – 2019-09-07 (×2): 25 mg via ORAL
  Filled 2019-09-06 (×2): qty 1

## 2019-09-06 MED ORDER — SODIUM CHLORIDE 0.9 % IV SOLN: INTRAVENOUS | Status: AC

## 2019-09-06 MED ORDER — HYDROCODONE-ACETAMINOPHEN 5-325 MG PO TABS: 1.0000 | ORAL_TABLET | ORAL | Status: DC | PRN

## 2019-09-06 MED ORDER — ONDANSETRON HCL 4 MG/2ML IJ SOLN: 4.0000 mg | Freq: Four times a day (QID) | INTRAMUSCULAR | Status: DC | PRN

## 2019-09-06 MED ORDER — MORPHINE SULFATE (PF) 4 MG/ML IV SOLN: 4.0000 mg | Freq: Four times a day (QID) | INTRAVENOUS | Status: DC | PRN

## 2019-09-06 MED ORDER — SODIUM CHLORIDE 0.9 % IV SOLN
2.0000 g | INTRAVENOUS | Status: DC
  Administered 2019-09-06: 2 g via INTRAVENOUS
  Filled 2019-09-06: qty 20
  Filled 2019-09-06: qty 2

## 2019-09-06 MED ORDER — ACETAMINOPHEN 325 MG PO TABS: 650.0000 mg | ORAL_TABLET | Freq: Four times a day (QID) | ORAL | Status: DC | PRN

## 2019-09-06 MED ORDER — SODIUM CHLORIDE 0.9 % IV SOLN
500.0000 mg | INTRAVENOUS | Status: DC
  Administered 2019-09-06: 500 mg via INTRAVENOUS
  Filled 2019-09-06 (×2): qty 500

## 2019-09-06 MED ORDER — GABAPENTIN 300 MG PO CAPS
900.0000 mg | ORAL_CAPSULE | Freq: Every day | ORAL | Status: DC
  Administered 2019-09-06: 900 mg via ORAL
  Filled 2019-09-06: qty 3

## 2019-09-06 NOTE — Progress Notes (Signed)
[name]  PROGRESS NOTE    Kathleen Gibson  [id number] DOB: 1951-06-11 DOA: 09/05/2019 PCP: [name], DO   Brief Narrative:   GRABIELA Gibson is a 69 y.o. female with medical history significant for chronic thrombocytopenia (suspected ITP), type 2 diabetes mellitus, and hypertension, now presenting to the emergency department with approximately 5 days of fevers, fatigue, bilateral lower extremity rash, aches, shortness of breath, and abdominal pain.  Patient noted rash involving bilateral ankles and feet, similar to a rash she has had in the past but more severe, nonpruritic, and appearing as reddish/purple macules.  She reports low-grade fevers, generalized aches, and fatigue.  She denies any cough but has felt short of breath.  She has also been experiencing abdominal pain without vomiting or diarrhea.  She was seen in the emergency department for this on 09/04/2019, found to have elevated creatinine, was offered admission at that time, but elected to go home.  She continued to feel worse back at home and returns tonight.  Patient reports that she is fully vaccinated against COVID-19 and has had multiple recent negative tests.  3/26: Rashes on feet are petechial but at the ankle almost have a venous status appearance. ANCA, cryoglobulin, ANA ordered. Awaiting titres. She has some lymphadenopathy that is suspicious. Renal function is too high to chase with contrasted studies, but it is improving with fluids. Let's continue fluid and get her renal fxn into a range where we can do further investigation. At that time, call oncology. Continue PNA treatment. Family updated at bedside.    Assessment & Plan:   Principal Problem:   Pneumonia Active Problems:   Diabetes mellitus type II, non insulin dependent (HCC)   Essential hypertension   Chronic ITP (idiopathic thrombocytopenia) (HCC)   Renal insufficiency  Left perihilar pneumonia?      - Presents with fevers, aches, fatigue, rash, abd pain, and SOB,  has left perihilar opacity on CXR concerning for possibly PNA, but no cough, tachypnea, or hypoxia     - COVID negative     - procal is 1.7     - continue zithro, rocephin      - urine legionella/strep pending  Renal insufficiency       - SCr is 1.71 in ED, up from 0.87 one yr ago but improved from the day before      - SCr is 1.58 today, continue fluids    Abdominal pain; adenopathy     - No acute findings on CT though study notable for adenopathy that has increased since March 2017      - Findings discussed with patient, including radiologist's concern for possible neoplastic process     - continue fluids for renal function and consider a contrasted study if renal fucntion improves enough    DM2, not on long term insulin     - A1c was 6.7% last year      - rpt A1c is 7.1%     - continune low-intensity SSI  Hypertension      - BP is ok     - hold home HCTZ, can continue metoprolol  Thrombocytopenia      - Platelets 83k in ED without bleeding      - Had been following with hematology, last seen 2 yrs ago when plan was to observe unless platelets fall below 30k     - plt are up to 90k    Rash      - Presents with petechial  rash involving bilateral ankles and feet despite platelets higher than previously      - Fever may be secondary to PNA but without cough, tachypnea or hypoxia, vasculitis is also considered      - inflammatory markers are elevated; hep panel is negative     - ANA, ANCA, cryoglobulins pending   DVT prophylaxis: SCDs Code Status: FULL Family Communication: Updated family at bedside.   Disposition Plan: Remains inpt for ongoing w/u, IV abx need.  Antimicrobials:  . Rocephin, zithro   ROS:  Reports weakness. Denies CP, N, V. Remainder 10-pt ROS is negative for all not previously mentioned.  Subjective: "I just want to know what this is."  Objective: Vitals:   09/06/19 0230 09/06/19 0335 09/06/19 0824 09/06/19 1111  BP: 137/69 (!) 143/77 (!)  121/51 129/61  Pulse: 94 100 95 91  Resp: (!) 28 16    Temp:  98.5 F (36.9 C) 98.3 F (36.8 C) 98 F (36.7 C)  TempSrc:  Oral Oral Oral  SpO2: 96% 99% 97% 98%  Weight:  88.3 kg    Height:  5\' 3"  (1.6 m)      Intake/Output Summary (Last 24 hours) at 09/06/2019 1411 Last data filed at 09/05/2019 2350 Gross per 24 hour  Intake 1600 ml  Output --  Net 1600 ml   Filed Weights   09/06/19 0335  Weight: 88.3 kg    Examination:  General: 69 y.o. female resting in bed in NAD Cardiovascular: RRR, +S1, S2, no g/r, 1/6 SEM, equal pulses throughout Respiratory: CTABL, no w/r/r, normal WOB GI: BS+, NDNT, no masses noted, no organomegaly noted MSK: No e/c/c, BLE petechial rash on feet becoming more macular as they move just superior to ankles Neuro: A&O x 3, no focal deficits Psyc: Appropriate interaction and affect, calm/cooperative   Data Reviewed: I have personally reviewed following labs and imaging studies.  CBC: Recent Labs  Lab 09/04/19 1106 09/05/19 2058 09/06/19 0825  WBC 9.7 8.9 9.2  NEUTROABS 5.6 5.4 5.7  HGB 10.5* 11.1* 9.6*  HCT 31.7* 34.0* 29.7*  MCV 89.0 88.3 89.2  PLT 68* 83* 90*   Basic Metabolic Panel: Recent Labs  Lab 09/04/19 1106 09/05/19 2058 09/06/19 0825  NA 134* 136 140  K 3.6 3.6 3.4*  CL 99 100 105  CO2 24 24 24   GLUCOSE 109* 206* 157*  BUN 35* 33* 25*  CREATININE 2.13* 1.71* 1.55*  CALCIUM 8.4* 8.6* 8.2*   GFR: Estimated Creatinine Clearance: 36.6 mL/min (A) (by C-G formula based on SCr of 1.55 mg/dL (H)). Liver Function Tests: Recent Labs  Lab 09/05/19 2058  AST 28  ALT 25  ALKPHOS 60  BILITOT 0.8  PROT 8.3*  ALBUMIN 3.4*   No results for input(s): LIPASE, AMYLASE in the last 168 hours. No results for input(s): AMMONIA in the last 168 hours. Coagulation Profile: Recent Labs  Lab 09/05/19 2058  INR 1.1   Cardiac Enzymes: No results for input(s): CKTOTAL, CKMB, CKMBINDEX, TROPONINI in the last 168 hours. BNP (last 3  results) No results for input(s): PROBNP in the last 8760 hours. HbA1C: Recent Labs    09/06/19 0825  HGBA1C 7.1*   CBG: Recent Labs  Lab 09/06/19 0443 09/06/19 0806 09/06/19 1111  GLUCAP 138* 141* 163*   Lipid Profile: No results for input(s): CHOL, HDL, LDLCALC, TRIG, CHOLHDL, LDLDIRECT in the last 72 hours. Thyroid Function Tests: No results for input(s): TSH, T4TOTAL, FREET4, T3FREE, THYROIDAB in the last 72 hours. Anemia Panel:  No results for input(s): VITAMINB12, FOLATE, FERRITIN, TIBC, IRON, RETICCTPCT in the last 72 hours. Sepsis Labs: Recent Labs  Lab 09/05/19 2058 09/06/19 0825  PROCALCITON  --  1.70  LATICACIDVEN 1.3  --     Recent Results (from the past 240 hour(s))  SARS CORONAVIRUS 2 (TAT 6-24 HRS) Nasopharyngeal Nasopharyngeal Swab     Status: None   Collection Time: 09/04/19 11:11 AM   Specimen: Nasopharyngeal Swab  Result Value Ref Range Status   SARS Coronavirus 2 NEGATIVE NEGATIVE Final    Comment: (NOTE) SARS-CoV-2 target nucleic acids are NOT DETECTED. The SARS-CoV-2 RNA is generally detectable in upper and lower respiratory specimens during the acute phase of infection. Negative results do not preclude SARS-CoV-2 infection, do not rule out co-infections with other pathogens, and should not be used as the sole basis for treatment or other patient management decisions. Negative results must be combined with clinical observations, patient history, and epidemiological information. The expected result is Negative. Fact Sheet for Patients: SugarRoll.be Fact Sheet for Healthcare Providers: https://www.woods-mathews.com/ This test is not yet approved or cleared by the [place] FDA and  has been authorized for detection and/or diagnosis of SARS-CoV-2 by FDA under an Emergency Use Authorization (EUA). This EUA will remain  in effect (meaning this test can be used) for the duration of the COVID-19  declaration under Section 56 4(b)(1) of the Act, 21 U.S.C. section 360bbb-3(b)(1), unless the authorization is terminated or revoked sooner. Performed at [facility], [place] [address] [place], [place] [address]   Blood Culture (routine x 2)     Status: None (Preliminary result)   Collection Time: 09/05/19  8:45 PM   Specimen: BLOOD  Result Value Ref Range Status   Specimen Description   Final    BLOOD RIGHT ANTECUBITAL Performed at [facility], [address] [place], [place] [address]    Special Requests   Final    BOTTLES DRAWN AEROBIC AND ANAEROBIC Blood Culture adequate volume Performed at [facility], [address] [place], [place] [address]    Culture   Final    NO GROWTH < 12 HOURS Performed at [facility], [place] [address] [place], [place] [address]    Report Status PENDING  Incomplete  Blood Culture (routine x 2)     Status: None (Preliminary result)   Collection Time: 09/05/19  9:00 PM   Specimen: BLOOD RIGHT WRIST  Result Value Ref Range Status   Specimen Description   Final    BLOOD RIGHT WRIST Performed at [facility], [address] [place], [place] [address]    Special Requests   Final    BOTTLES DRAWN AEROBIC AND ANAEROBIC Blood Culture adequate volume Performed at [facility], [address] [place], [place] [address]    Culture   Final    NO GROWTH < 12 HOURS Performed at [facility], [place] [address] [place], [place] [address]    Report Status PENDING  Incomplete  SARS CORONAVIRUS 2 (TAT 6-24 HRS) Nasopharyngeal Nasopharyngeal Swab     Status: None   Collection Time: 09/06/19  5:34 AM   Specimen: Nasopharyngeal Swab  Result Value Ref Range Status   SARS Coronavirus 2 NEGATIVE NEGATIVE Final    Comment: (NOTE) SARS-CoV-2 target nucleic acids are NOT DETECTED. The SARS-CoV-2 RNA is generally detectable in upper and lower respiratory specimens during the acute phase of infection.  Negative  results do not preclude SARS-CoV-2 infection, do not rule out co-infections with other pathogens, and should not be used as the sole basis for treatment or other patient management decisions. Negative results must be combined with clinical observations, patient history, and epidemiological information. The expected result is Negative. Fact Sheet for Patients: SugarRoll.be Fact Sheet for Healthcare Providers: https://www.woods-mathews.com/ This test is not yet approved or cleared by the [place] FDA and  has been authorized for detection and/or diagnosis of SARS-CoV-2 by FDA under an Emergency Use Authorization (EUA). This EUA will remain  in effect (meaning this test can be used) for the duration of the COVID-19 declaration under Section 56 4(b)(1) of the Act, 21 U.S.C. section 360bbb-3(b)(1), unless the authorization is terminated or revoked sooner. Performed at [facility], [place] [address] [place], [place] [address]       Radiology Studies: CT ABDOMEN PELVIS WO CONTRAST  Result Date: 09/05/2019 CLINICAL DATA:  Left lower quadrant pain with abdominal distention and fever. EXAM: CT ABDOMEN AND PELVIS WITHOUT CONTRAST TECHNIQUE: Multidetector CT imaging of the abdomen and pelvis was performed following the standard protocol without IV contrast. COMPARISON:  08/21/2015 FINDINGS: Lower chest: Visualized lung bases are clear. Hepatobiliary: Minimal cholelithiasis. Subcentimeter calcification along the posterior border of the liver. Biliary tree is normal. Pancreas: Normal. Spleen: Normal. Adrenals/Urinary Tract: Adrenal glands are normal. Kidneys normal size. No significant hydronephrosis. Couple possible small parapelvic left renal cysts. Ureters and bladder are normal. Stomach/Bowel: Evidence of previous gastric bypass surgery. Small bowel is unremarkable. Mild diverticulosis of the colon. Appendix is normal. Vascular/Lymphatic:  Mild calcified plaque over the abdominal aorta which is normal in caliber. There is been a significant interval increase in size and number of lymph nodes over the mesentery, celiac axis and periaortic region as well as along the iliac chains. Most prominent lymph nodes are along the celiac axis measuring up to 1.7 cm by short axis. Reproductive: Unremarkable. Other: No free fluid or focal inflammatory change. Small periumbilical hernia containing only peritoneal fat unchanged. There are a few nonspecific peritoneal calcifications unchanged. Musculoskeletal: No focal abnormality. IMPRESSION: 1.  No acute findings in the abdomen/pelvis. 2. Interval worsening adenopathy over the upper abdomen, mesentery, periaortic region and iliac chains as described. Adenopathy is worse over the region of the celiac axis with lymph nodes measuring up to 1.8 cm by short axis. Findings are concerning for a neoplastic process. 3.  Cholelithiasis. 4.  Mild diverticulosis of the colon. 5. Small periumbilical hernia containing only peritoneal fat unchanged. 6.  Aortic Atherosclerosis (ICD10-I70.0). Electronically Signed   By: Marin Olp M.D.   On: 09/05/2019 21:01   DG Chest Port 1 View  Result Date: 09/05/2019 CLINICAL DATA:  Cough, shortness of breath EXAM: PORTABLE CHEST 1 VIEW COMPARISON:  09/04/2019 FINDINGS: Heart is normal size. Left perihilar and infrahilar airspace opacity slightly increased since prior study concerning for possible pneumonia. Right lung clear. No effusions or acute bony abnormality. IMPRESSION: Left perihilar airspace opacity, question pneumonia. Electronically Signed   By: Rolm Baptise M.D.   On: 09/05/2019 20:42     Scheduled Meds: . gabapentin  900 mg Oral QHS  . insulin aspart  0-9 Units Subcutaneous TID WC  . metoprolol succinate  25 mg Oral Daily  . potassium chloride  40 mEq Oral BID   Continuous Infusions: . sodium chloride 100 mL/hr at 09/06/19 0430  . azithromycin    . cefTRIAXone  (ROCEPHIN)  IV       LOS:  1 day    Time spent: 25 minutes spent in the coordination of care today.    Jonnie Finner, DO [facility]  If 7PM-7AM, please contact night-coverage [email or web address] 09/06/2019, 2:11 PM

## 2019-09-06 NOTE — H&P (Addendum)
History and Physical    AKAYSHA COBERN ERD:[id number] DOB: 1950-10-05 DOA: 09/05/2019  PCP: [name], DO   Patient coming from: Home   Chief Complaint: Bilateral foot and ankle rash, aches, fatigue, fevers, abdominal pain, SOB   HPI: Kathleen Gibson is a 69 y.o. female with medical history significant for chronic thrombocytopenia (suspected ITP), type 2 diabetes mellitus, and hypertension, now presenting to the emergency department with approximately 5 days of fevers, fatigue, bilateral lower extremity rash, aches, shortness of breath, and abdominal pain.  Patient noted rash involving bilateral ankles and feet, similar to a rash she has had in the past but more severe, nonpruritic, and appearing as reddish/purple macules.  She reports low-grade fevers, generalized aches, and fatigue.  She denies any cough but has felt short of breath.  She has also been experiencing abdominal pain without vomiting or diarrhea.  She was seen in the emergency department for this on 09/04/2019, found to have elevated creatinine, was offered admission at that time, but elected to go home.  She continued to feel worse back at home and returns tonight.  Patient reports that she is fully vaccinated against COVID-19 and has had multiple recent negative tests.  [facility] ED Course: Upon arrival to the ED, patient is found to be febrile to 38.7, saturating mid 90s on room air, tachycardic in the 110s, and with blood pressure 111/58.  EKG features a sinus tachycardia with rate 117.  Chest x-ray concerning for left perihilar pneumonia.  CT of the abdomen and pelvis without any acute findings but interval worsening of adenopathy concerning for a neoplastic process.  Chemistry panel with glucose 206 and creatinine 1.71, up from 0.87 and 2020.  CBC with mild normocytic anemia and thrombocytopenia with platelets 83,000.  Lactic acid is reassuringly normal.  Blood and urine cultures were collected in the ED and the patient  was treated with acetaminophen, Rocephin, azithromycin, and 1 L of normal saline.  Review of Systems:  All other systems reviewed and apart from HPI, are negative.  Past Medical History:  Diagnosis Date  . Diabetes ([place])   . HLD (hyperlipidemia)   . Hypertension   . Pernicious anemia   . Vitamin D deficiency     Past Surgical History:  Procedure Laterality Date  . ABDOMINAL HERNIA REPAIR  2009  . ABDOMINOPLASTY    . [place]  . GASTRIC BYPASS  2000  . hemorroid surgery  2007  . TONSILLECTOMY AND ADENOIDECTOMY  1957     reports that she has never smoked. She has never used smokeless tobacco. She reports that she does not drink alcohol or use drugs.  Allergies  Allergen Reactions  . Asa [Aspirin] Other (See Comments)    Contraindicated d/t low plts  . Nsaids Other (See Comments)    Contraindicated d/t low plts    Family History  Problem Relation Age of Onset  . Cancer Mother 18       unknown type cancer   . Hypertension Mother   . Hypertension Father   . Heart failure Father   . Pneumonia Father   . Diabetes Sister   . Leukemia Brother   . Colon cancer Brother 30  . Heart Problems Brother   . Leukemia Other   . Diabetes Sister   . Heart Problems Sister   . Cancer Brother   . Heart Problems Brother      Prior to Admission medications   Medication Sig Start  Date End Date Taking? Authorizing Provider  doxycycline (VIBRAMYCIN) 100 MG capsule Take 1 capsule (100 mg total) by mouth 2 (two) times daily for 7 days. 09/04/19 09/11/19  Lucrezia Starch, MD  ferrous sulfate 325 (65 FE) MG tablet Take 1 tablet (325 mg total) by mouth 2 (two) times daily with a meal. 05/14/19   Kuneff, Renee A, DO  gabapentin (NEURONTIN) 300 MG capsule 900 my QHS and 300 mg every 8 hrs in day 04/15/19   Kuneff, Renee A, DO  glipiZIDE (GLUCOTROL) 5 MG tablet Take 1 tablet (5 mg total) by mouth daily before breakfast. 04/25/19   Kuneff, Renee A, DO  glucose monitoring kit  (FREESTYLE) monitoring kit 1 each by Does not apply route as needed for other. Use as directed 02/24/14   Lucille Passy, MD  hydrochlorothiazide (HYDRODIURIL) 12.5 MG tablet Take 1 tablet (12.5 mg total) by mouth daily. 04/15/19   Kuneff, Renee A, DO  metoprolol succinate (TOPROL-XL) 50 MG 24 hr tablet Take 1 tablet (50 mg total) by mouth daily. 04/15/19   Kuneff, Renee A, DO  Omega-3 Fatty Acids (FISH OIL) 1000 MG CAPS Take 1,000 mg by mouth daily.     [provider]  sitaGLIPtin-metformin (JANUMET) 50-1000 MG tablet Take 1 tablet by mouth 2 (two) times daily with a meal.    [provider]  Vitamin D, Cholecalciferol, 25 MCG (1000 UT) CAPS Take by mouth.    [provider]  zolpidem (AMBIEN) 5 MG tablet Take 1 tablet (5 mg total) by mouth at bedtime. 04/15/19   [name], DO    Physical Exam: Vitals:   09/06/19 0200 09/06/19 0207 09/06/19 0230 09/06/19 0335  BP: 125/70 125/70 137/69 (!) 143/77  Pulse: 98 93 94 100  Resp: 18 (!) 27 (!) 28 16  Temp:    98.5 F (36.9 C)  TempSrc:    Oral  SpO2: 97% 93% 96% 99%    Constitutional: NAD, calm  Eyes: PERTLA, lids and conjunctivae normal ENMT: Mucous membranes are moist. Posterior pharynx clear of any exudate or lesions.   Neck: normal, supple, no masses, no thyromegaly Respiratory:  no wheezing, no crackles. No accessory muscle use.  Cardiovascular: S1 & S2 heard, regular rate and rhythm. No extremity edema.   Abdomen: No distension, soft, tender without rebound pain or guarding. Bowel sounds active.  Musculoskeletal: no clubbing / cyanosis. No joint deformity upper and lower extremities.   Skin: Clustered red macules bilateral lower legs and feet. Warm, dry, well-perfused. Neurologic: No facial asymmetry. Sensation intact. Moving all extremities.  Psychiatric: Alert and oriented to person, place, and situation. Pleasant and cooperative.    Labs and Imaging on Admission: I have personally reviewed following  labs and imaging studies  CBC: Recent Labs  Lab 09/04/19 1106 09/05/19 2058  WBC 9.7 8.9  NEUTROABS 5.6 5.4  HGB 10.5* 11.1*  HCT 31.7* 34.0*  MCV 89.0 88.3  PLT 68* 83*   Basic Metabolic Panel: Recent Labs  Lab 09/04/19 1106 09/05/19 2058  NA 134* 136  K 3.6 3.6  CL 99 100  CO2 24 24  GLUCOSE 109* 206*  BUN 35* 33*  CREATININE 2.13* 1.71*  CALCIUM 8.4* 8.6*   GFR: Estimated Creatinine Clearance: 33.5 mL/min (A) (by C-G formula based on SCr of 1.71 mg/dL (H)). Liver Function Tests: Recent Labs  Lab 09/05/19 2058  AST 28  ALT 25  ALKPHOS 60  BILITOT 0.8  PROT 8.3*  ALBUMIN 3.4*  No results for input(s): LIPASE, AMYLASE in the last 168 hours. No results for input(s): AMMONIA in the last 168 hours. Coagulation Profile: Recent Labs  Lab 09/05/19 2058  INR 1.1   Cardiac Enzymes: No results for input(s): CKTOTAL, CKMB, CKMBINDEX, TROPONINI in the last 168 hours. BNP (last 3 results) No results for input(s): PROBNP in the last 8760 hours. HbA1C: No results for input(s): HGBA1C in the last 72 hours. CBG: No results for input(s): GLUCAP in the last 168 hours. Lipid Profile: No results for input(s): CHOL, HDL, LDLCALC, TRIG, CHOLHDL, LDLDIRECT in the last 72 hours. Thyroid Function Tests: No results for input(s): TSH, T4TOTAL, FREET4, T3FREE, THYROIDAB in the last 72 hours. Anemia Panel: No results for input(s): VITAMINB12, FOLATE, FERRITIN, TIBC, IRON, RETICCTPCT in the last 72 hours. Urine analysis:    Component Value Date/Time   COLORURINE YELLOW 09/05/2019 2240   APPEARANCEUR CLEAR 09/05/2019 2240   LABSPEC 1.010 09/05/2019 2240   PHURINE 5.5 09/05/2019 2240   GLUCOSEU NEGATIVE 09/05/2019 2240   HGBUR MODERATE (A) 09/05/2019 2240   BILIRUBINUR NEGATIVE 09/05/2019 2240   KETONESUR NEGATIVE 09/05/2019 2240   PROTEINUR 30 (A) 09/05/2019 2240   NITRITE NEGATIVE 09/05/2019 2240   LEUKOCYTESUR NEGATIVE 09/05/2019 2240   Sepsis  Labs: '@LABRCNTIP'$ (procalcitonin:4,lacticidven:4) ) Recent Results (from the past 240 hour(s))  SARS CORONAVIRUS 2 (TAT 6-24 HRS) Nasopharyngeal Nasopharyngeal Swab     Status: None   Collection Time: 09/04/19 11:11 AM   Specimen: Nasopharyngeal Swab  Result Value Ref Range Status   SARS Coronavirus 2 NEGATIVE NEGATIVE Final    Comment: (NOTE) SARS-CoV-2 target nucleic acids are NOT DETECTED. The SARS-CoV-2 RNA is generally detectable in upper and lower respiratory specimens during the acute phase of infection. Negative results do not preclude SARS-CoV-2 infection, do not rule out co-infections with other pathogens, and should not be used as the sole basis for treatment or other patient management decisions. Negative results must be combined with clinical observations, patient history, and epidemiological information. The expected result is Negative. Fact Sheet for Patients: SugarRoll.be Fact Sheet for Healthcare Providers: https://www.woods-mathews.com/ This test is not yet approved or cleared by the [place] FDA and  has been authorized for detection and/or diagnosis of SARS-CoV-2 by FDA under an Emergency Use Authorization (EUA). This EUA will remain  in effect (meaning this test can be used) for the duration of the COVID-19 declaration under Section 56 4(b)(1) of the Act, 21 U.S.C. section 360bbb-3(b)(1), unless the authorization is terminated or revoked sooner. Performed at [facility], [place] [address] [place], [place] [address]      Radiological Exams on Admission: CT ABDOMEN PELVIS WO CONTRAST  Result Date: 09/05/2019 CLINICAL DATA:  Left lower quadrant pain with abdominal distention and fever. EXAM: CT ABDOMEN AND PELVIS WITHOUT CONTRAST TECHNIQUE: Multidetector CT imaging of the abdomen and pelvis was performed following the standard protocol without IV contrast. COMPARISON:  08/21/2015 FINDINGS: Lower chest: Visualized  lung bases are clear. Hepatobiliary: Minimal cholelithiasis. Subcentimeter calcification along the posterior border of the liver. Biliary tree is normal. Pancreas: Normal. Spleen: Normal. Adrenals/Urinary Tract: Adrenal glands are normal. Kidneys normal size. No significant hydronephrosis. Couple possible small parapelvic left renal cysts. Ureters and bladder are normal. Stomach/Bowel: Evidence of previous gastric bypass surgery. Small bowel is unremarkable. Mild diverticulosis of the colon. Appendix is normal. Vascular/Lymphatic: Mild calcified plaque over the abdominal aorta which is normal in caliber. There is been a significant interval increase in size and number of lymph nodes over  the mesentery, celiac axis and periaortic region as well as along the iliac chains. Most prominent lymph nodes are along the celiac axis measuring up to 1.7 cm by short axis. Reproductive: Unremarkable. Other: No free fluid or focal inflammatory change. Small periumbilical hernia containing only peritoneal fat unchanged. There are a few nonspecific peritoneal calcifications unchanged. Musculoskeletal: No focal abnormality. IMPRESSION: 1.  No acute findings in the abdomen/pelvis. 2. Interval worsening adenopathy over the upper abdomen, mesentery, periaortic region and iliac chains as described. Adenopathy is worse over the region of the celiac axis with lymph nodes measuring up to 1.8 cm by short axis. Findings are concerning for a neoplastic process. 3.  Cholelithiasis. 4.  Mild diverticulosis of the colon. 5. Small periumbilical hernia containing only peritoneal fat unchanged. 6.  Aortic Atherosclerosis (ICD10-I70.0). Electronically Signed   By: Marin Olp M.D.   On: 09/05/2019 21:01   DG Chest Port 1 View  Result Date: 09/05/2019 CLINICAL DATA:  Cough, shortness of breath EXAM: PORTABLE CHEST 1 VIEW COMPARISON:  09/04/2019 FINDINGS: Heart is normal size. Left perihilar and infrahilar airspace opacity slightly increased  since prior study concerning for possible pneumonia. Right lung clear. No effusions or acute bony abnormality. IMPRESSION: Left perihilar airspace opacity, question pneumonia. Electronically Signed   By: Rolm Baptise M.D.   On: 09/05/2019 20:42   DG Chest Portable 1 View  Result Date: 09/04/2019 CLINICAL DATA:  Cough and fever EXAM: PORTABLE CHEST 1 VIEW COMPARISON:  July 24, 2018 FINDINGS: There is slight atelectasis in the left base. The lungs elsewhere are clear. Heart size and pulmonary vascularity are normal. No adenopathy. No bone lesions. IMPRESSION: Focal lateral left base atelectasis. Lungs elsewhere clear. Cardiac silhouette within normal limits. No adenopathy. Electronically Signed   By: Lowella Grip III M.D.   On: 09/04/2019 11:17    EKG: Independently reviewed. Sinus tachycardia (rate 117).   Assessment/Plan   1. ?Pneumonia  - Presents with fevers, aches, fatigue, rash, abd pain, and SOB, has left perihilar opacity on CXR concerning for possibly PNA, but no cough, tachypnea, or hypoxia  - She was started on Rocephin and azithromycin in ED  - Check procalcitonin, check strep pneumo and legionella antigens, check sputum culture, continue Rocephin and azithromycin for now, consider chest CT with contrast if renal function improves    2. Renal insufficiency   - SCr is 1.71 in ED, up from 0.87 one yr ago but improved from the day before  - Check UA and urine chemistries, continue IVF hydration, repeat chem panel    3. Abdominal pain; adenopathy - No acute findings on CT though study notable for adenopathy that has increased since March 2017  - Findings discussed with patient, including radiologist's concern for possible neoplastic process    4. Type II DM  - A1c was 6.7% last year  - Pharmacy medication-reconciliation pending, will check CBGs and use a low-intensity SSI with Novolog for now   5. Hypertension  - BP at goal, pharmacy medication-reconciliation pending    6. Thrombocytopenia  - Platelets 83k in ED without bleeding  - Had been following with hematology, last seen 2 yrs ago when plan was to observe unless platelets fall below 30k    7. Rash  - Presents with petechial rash involving bilateral ankles and feet despite platelets higher than previously  - Fever may be secondary to PNA but without cough, tachypnea or hypoxia, vasculitis is also considered  - Check inflammatory markers, antibodies, viral hepatitis  DVT prophylaxis: SCDs Code Status: Full  Family Communication: Discussed with patient  Disposition Plan: Likely home in 2-4 days once renal function and infection improved and stable  Consults called: none  Admission status: Inpatient     [name], MD [facility] Pager: See [email or web address]  If 7AM-7PM, please contact the daytime attending [email or web address]  09/06/2019, 4:09 AM

## 2019-09-07 LAB — CBC WITH DIFFERENTIAL/PLATELET
Abs Immature Granulocytes: 0.67 10*3/uL — ABNORMAL HIGH (ref 0.00–0.07)
Basophils Absolute: 0 10*3/uL (ref 0.0–0.1)
Basophils Relative: 1 %
Eosinophils Absolute: 0 10*3/uL (ref 0.0–0.5)
Eosinophils Relative: 0 %
HCT: 31.1 % — ABNORMAL LOW (ref 36.0–46.0)
Hemoglobin: 9.9 g/dL — ABNORMAL LOW (ref 12.0–15.0)
Immature Granulocytes: 9 %
Lymphocytes Relative: 14 %
Lymphs Abs: 1.1 10*3/uL (ref 0.7–4.0)
MCH: 28.4 pg (ref 26.0–34.0)
MCHC: 31.8 g/dL (ref 30.0–36.0)
MCV: 89.4 fL (ref 80.0–100.0)
Monocytes Absolute: 1.7 10*3/uL — ABNORMAL HIGH (ref 0.1–1.0)
Monocytes Relative: 21 %
Neutro Abs: 4.4 10*3/uL (ref 1.7–7.7)
Neutrophils Relative %: 55 %
Platelets: 96 10*3/uL — ABNORMAL LOW (ref 150–400)
RBC: 3.48 MIL/uL — ABNORMAL LOW (ref 3.87–5.11)
RDW: 13.6 % (ref 11.5–15.5)
WBC: 7.9 10*3/uL (ref 4.0–10.5)
nRBC: 0 % (ref 0.0–0.2)

## 2019-09-07 LAB — RENAL FUNCTION PANEL
Albumin: 2.8 g/dL — ABNORMAL LOW (ref 3.5–5.0)
Anion gap: 10 (ref 5–15)
BUN: 22 mg/dL (ref 8–23)
CO2: 23 mmol/L (ref 22–32)
Calcium: 8.4 mg/dL — ABNORMAL LOW (ref 8.9–10.3)
Chloride: 109 mmol/L (ref 98–111)
Creatinine, Ser: 1.39 mg/dL — ABNORMAL HIGH (ref 0.44–1.00)
GFR calc Af Amer: 45 mL/min — ABNORMAL LOW (ref >60)
GFR calc non Af Amer: 39 mL/min — ABNORMAL LOW (ref >60)
Glucose, Bld: 111 mg/dL — ABNORMAL HIGH (ref 70–99)
Phosphorus: 3.6 mg/dL (ref 2.5–4.6)
Potassium: 4.3 mmol/L (ref 3.5–5.1)
Sodium: 142 mmol/L (ref 135–145)

## 2019-09-07 LAB — ANA W/REFLEX IF POSITIVE: Anti Nuclear Antibody (ANA): NEGATIVE

## 2019-09-07 LAB — CREATININE, URINE, RANDOM: Creatinine, Urine: 27.89 mg/dL

## 2019-09-07 LAB — GLUCOSE, CAPILLARY
Glucose-Capillary: 118 mg/dL — ABNORMAL HIGH (ref 70–99)
Glucose-Capillary: 142 mg/dL — ABNORMAL HIGH (ref 70–99)

## 2019-09-07 LAB — URINE CULTURE: Culture: 40000 — AB

## 2019-09-07 LAB — SODIUM, URINE, RANDOM: Sodium, Ur: 67 mmol/L

## 2019-09-07 LAB — STREP PNEUMONIAE URINARY ANTIGEN: Strep Pneumo Urinary Antigen: NEGATIVE

## 2019-09-07 LAB — MAGNESIUM: Magnesium: 1.8 mg/dL (ref 1.7–2.4)

## 2019-09-07 MED ORDER — CEFPODOXIME PROXETIL 200 MG PO TABS: 200.0000 mg | ORAL_TABLET | Freq: Two times a day (BID) | ORAL | 0 refills | Status: DC

## 2019-09-07 MED ORDER — AZITHROMYCIN 500 MG PO TABS: 500.0000 mg | ORAL_TABLET | Freq: Every day | ORAL | 0 refills | Status: AC

## 2019-09-07 NOTE — Progress Notes (Signed)
Patient is A&O X4; no significant event/problem has occurred. Will continue to monitor.

## 2019-09-07 NOTE — Progress Notes (Signed)
PT Cancellation Note  Patient Details Name: Kathleen Gibson MRN: [id number] DOB: Dec 10, 1950   Cancelled Treatment:    Reason Eval/Treat Not Completed: PT screened, no needs identified, will sign off.  Pt reports independently ambulating around her hospital room without an assistive device.  Doing "laps" per pt report.  She reports she feels weak, but not overly weak.  She reports she does not feel unsteady or feel the need to have a walker.  She is going to stay with her sister for a few days because she doesn't feel like doing the 14 stairs up and down to her bedroom yet.  I spoke with RN and MD.  She should be safe to d/c home with her sister with no PT follow up and no AD.  If RN gets her up and has concerns as he is getting her ready for d/c he will call me back .  Thanks,  Verdene Lennert, PT, DPT  Acute Rehabilitation [phone] pager #[phone] office       [name] 09/07/2019, 11:05 AM

## 2019-09-07 NOTE — Discharge Summary (Signed)
Physician Discharge Summary  RESHA FILIPPONE QPR:[id number] DOB: 1951/03/20 DOA: 09/05/2019  PCP: [name], DO  Admit date: 09/05/2019 Discharge date: 09/07/2019  Admitted From: Home Disposition: Home  Recommendations for Outpatient Follow-up:  1. Follow up with PCP in 1-2 weeks 2. Please obtain BMP/CBC in one week with PCP 3. Please obtain CT ab/pelvis with contrast to evaluate lymphadenopathy 4. Follow up on ANCA, cryoglobulin, complement levels  Home Health:No Equipment/Devices:None Discharge Condition:Stable CODE STATUS:Full Diet recommendation:Regular  Brief/Interim Summary: AYSLIN KUNDERT a 69 y.o.femalewith medical history significant forchronic thrombocytopenia (suspected ITP), type 2 diabetes mellitus, and hypertension, now presenting to the emergency department with approximately 5 days of fevers, fatigue, bilateral lower extremity rash, aches, shortness of breath, and abdominal pain. Patient noted rash involving bilateral ankles and feet, similar to a rash she has had in the past but more severe, nonpruritic, and appearing as reddish/purple macules. She reports low-grade fevers, generalized aches, and fatigue. She denies any cough but has felt short of breath. She has also been experiencing abdominal pain without vomiting or diarrhea. She was seen in the emergency department for this on 09/04/2019, found to have elevated creatinine, was offered admission at that time, but elected to go home. She continued to feel worse back at home and returns tonight. Patient reports that she is fully vaccinated against COVID-19 and has had multiple recent negative tests. She now has rashes on feet that are petechial but at the ankle almost have a venous status appearance. ANCA, cryoglobulin, ANA ordered with ANA returning negative with others pending at time of discharge. She underwent CXR concerning for left perihilar pneumonia along with CT ab/pelvis with abdominal lymphadenopathy that  is suspicious on CT ab/pelvis and will need outpatient CT with contrast of abdomen and pelvis. Regarding the pneumonia she was started on rocephin and azithromycin on 3/25 and reports significant improvement overall over the last two days in respiratory status and strength level. Given her significant weakness and fatigue on arrival consult PT to evaluate for any possible needs as an outpatient. PT felt patient was safe for discharge home without any further assistance or assistive devices. She will be discharged on Vantin 200 mg BID for 5 more days to complete one week of therapy for pneumonia. She will also complete azithromycin for 3 more days of 500 mg daily. She will need close follow up with PCP to follow up on need of CT scan with contrast of abdomen and pelvis and lab work as above. She will be discharged home to self care with her daughter at bedside in stable condition.    Discharge Diagnoses:  Principal Problem:   Pneumonia Active Problems:   Diabetes mellitus type II, non insulin dependent (HCC)   Essential hypertension   Chronic ITP (idiopathic thrombocytopenia) (HCC)   Renal insufficiency    Discharge Instructions   Allergies as of 09/07/2019      Reactions   Asa [aspirin] Other (See Comments)   Contraindicated d/t low plts   Nsaids Other (See Comments)   Contraindicated d/t low plts      Medication List    STOP taking these medications   doxycycline 100 MG capsule Commonly known as: VIBRAMYCIN   glipiZIDE 5 MG tablet Commonly known as: GLUCOTROL   ibuprofen 200 MG tablet Commonly known as: ADVIL     TAKE these medications   acetaminophen 500 MG tablet Commonly known as: TYLENOL Take 1,000 mg by mouth every 6 (six) hours as needed for headache (pain).  azithromycin 500 MG tablet Commonly known as: Zithromax Take 1 tablet (500 mg total) by mouth daily for 3 days. Take 1 tablet daily for 3 days.   cefpodoxime 200 MG tablet Commonly known as: VANTIN Take 1  tablet (200 mg total) by mouth 2 (two) times daily.   ferrous sulfate 325 (65 FE) MG tablet Take 1 tablet (325 mg total) by mouth 2 (two) times daily with a meal.   Fish Oil 1000 MG Caps Take 1,000 mg by mouth daily.   gabapentin 300 MG capsule Commonly known as: NEURONTIN 900 my QHS and 300 mg every 8 hrs in day What changed:   how much to take  how to take this  when to take this  additional instructions   glucose monitoring kit monitoring kit 1 each by Does not apply route as needed for other. Use as directed   glyBURIDE 2.5 MG tablet Commonly known as: DIABETA Take 2.5 mg by mouth daily with breakfast.   hydrochlorothiazide 12.5 MG tablet Commonly known as: HYDRODIURIL Take 1 tablet (12.5 mg total) by mouth daily.   metoprolol succinate 50 MG 24 hr tablet Commonly known as: TOPROL-XL Take 1 tablet (50 mg total) by mouth daily.   sitaGLIPtin-metformin 50-1000 MG tablet Commonly known as: JANUMET Take 1 tablet by mouth at bedtime.   Vitamin D (Cholecalciferol) 25 MCG (1000 UT) Caps Take 1,000 Units by mouth daily.   zolpidem 5 MG tablet Commonly known as: AMBIEN Take 1 tablet (5 mg total) by mouth at bedtime.      Follow-up Information    Kuneff, Renee A, DO Follow up in 1 week(s).   Specialty: Family Medicine Contact information: [address] [place]  [address] [phone]          Allergies  Allergen Reactions  . Asa [Aspirin] Other (See Comments)    Contraindicated d/t low plts  . Nsaids Other (See Comments)    Contraindicated d/t low plts    Consultations:  None   Procedures/Studies: CT ABDOMEN PELVIS WO CONTRAST  Result Date: 09/05/2019 CLINICAL DATA:  Left lower quadrant pain with abdominal distention and fever. EXAM: CT ABDOMEN AND PELVIS WITHOUT CONTRAST TECHNIQUE: Multidetector CT imaging of the abdomen and pelvis was performed following the standard protocol without IV contrast. COMPARISON:  08/21/2015 FINDINGS: Lower chest:  Visualized lung bases are clear. Hepatobiliary: Minimal cholelithiasis. Subcentimeter calcification along the posterior border of the liver. Biliary tree is normal. Pancreas: Normal. Spleen: Normal. Adrenals/Urinary Tract: Adrenal glands are normal. Kidneys normal size. No significant hydronephrosis. Couple possible small parapelvic left renal cysts. Ureters and bladder are normal. Stomach/Bowel: Evidence of previous gastric bypass surgery. Small bowel is unremarkable. Mild diverticulosis of the colon. Appendix is normal. Vascular/Lymphatic: Mild calcified plaque over the abdominal aorta which is normal in caliber. There is been a significant interval increase in size and number of lymph nodes over the mesentery, celiac axis and periaortic region as well as along the iliac chains. Most prominent lymph nodes are along the celiac axis measuring up to 1.7 cm by short axis. Reproductive: Unremarkable. Other: No free fluid or focal inflammatory change. Small periumbilical hernia containing only peritoneal fat unchanged. There are a few nonspecific peritoneal calcifications unchanged. Musculoskeletal: No focal abnormality. IMPRESSION: 1.  No acute findings in the abdomen/pelvis. 2. Interval worsening adenopathy over the upper abdomen, mesentery, periaortic region and iliac chains as described. Adenopathy is worse over the region of the celiac axis with lymph nodes measuring up to 1.8 cm by short  axis. Findings are concerning for a neoplastic process. 3.  Cholelithiasis. 4.  Mild diverticulosis of the colon. 5. Small periumbilical hernia containing only peritoneal fat unchanged. 6.  Aortic Atherosclerosis (ICD10-I70.0). Electronically Signed   By: Marin Olp M.D.   On: 09/05/2019 21:01   DG Chest Port 1 View  Result Date: 09/05/2019 CLINICAL DATA:  Cough, shortness of breath EXAM: PORTABLE CHEST 1 VIEW COMPARISON:  09/04/2019 FINDINGS: Heart is normal size. Left perihilar and infrahilar airspace opacity slightly  increased since prior study concerning for possible pneumonia. Right lung clear. No effusions or acute bony abnormality. IMPRESSION: Left perihilar airspace opacity, question pneumonia. Electronically Signed   By: Rolm Baptise M.D.   On: 09/05/2019 20:42   DG Chest Portable 1 View  Result Date: 09/04/2019 CLINICAL DATA:  Cough and fever EXAM: PORTABLE CHEST 1 VIEW COMPARISON:  July 24, 2018 FINDINGS: There is slight atelectasis in the left base. The lungs elsewhere are clear. Heart size and pulmonary vascularity are normal. No adenopathy. No bone lesions. IMPRESSION: Focal lateral left base atelectasis. Lungs elsewhere clear. Cardiac silhouette within normal limits. No adenopathy. Electronically Signed   By: Lowella Grip III M.D.   On: 09/04/2019 11:17       Subjective: Patient in no distress this morning. She reports feeling back to herself with no respiratory issues and feels much improved from a strength standpoint as well. She is asking to go home. She reports that she has a primary care physician that she will follow up closely with and she already known she is currently in the hospital.   Discharge Exam: Vitals:   09/07/19 0354 09/07/19 0818  BP: 136/63 (!) 144/75  Pulse: 96   Resp: 18   Temp: 99.4 F (37.4 C)   SpO2: 97%    Vitals:   09/06/19 1718 09/06/19 1958 09/07/19 0354 09/07/19 0818  BP:  (!) 116/57 136/63 (!) 144/75  Pulse: 84 85 96   Resp:  12 18   Temp: 98.1 F (36.7 C) 97.8 F (36.6 C) 99.4 F (37.4 C)   TempSrc: Oral Oral Oral   SpO2: 97% 98% 97%   Weight:   87.7 kg   Height:        General: Pt is alert, awake, not in acute distress Cardiovascular: RRR, S1/S2 +, no rubs, no gallops Respiratory: CTA bilaterally, no wheezing, no rhonchi Abdominal: Soft, NT, ND, bowel sounds + Extremities: no edema, no cyanosis    The results of significant diagnostics from this hospitalization (including imaging, microbiology, ancillary and laboratory) are listed  below for reference.     Microbiology: Recent Results (from the past 240 hour(s))  SARS CORONAVIRUS 2 (TAT 6-24 HRS) Nasopharyngeal Nasopharyngeal Swab     Status: None   Collection Time: 09/04/19 11:11 AM   Specimen: Nasopharyngeal Swab  Result Value Ref Range Status   SARS Coronavirus 2 NEGATIVE NEGATIVE Final    Comment: (NOTE) SARS-CoV-2 target nucleic acids are NOT DETECTED. The SARS-CoV-2 RNA is generally detectable in upper and lower respiratory specimens during the acute phase of infection. Negative results do not preclude SARS-CoV-2 infection, do not rule out co-infections with other pathogens, and should not be used as the sole basis for treatment or other patient management decisions. Negative results must be combined with clinical observations, patient history, and epidemiological information. The expected result is Negative. Fact Sheet for Patients: SugarRoll.be Fact Sheet for Healthcare Providers: https://www.woods-mathews.com/ This test is not yet approved or cleared by the [place] FDA and  has been authorized for detection and/or diagnosis of SARS-CoV-2 by FDA under an Emergency Use Authorization (EUA). This EUA will remain  in effect (meaning this test can be used) for the duration of the COVID-19 declaration under Section 56 4(b)(1) of the Act, 21 U.S.C. section 360bbb-3(b)(1), unless the authorization is terminated or revoked sooner. Performed at [facility], [place] [address], [place], [place] [address]   Blood Culture (routine x 2)     Status: None (Preliminary result)   Collection Time: 09/05/19  8:45 PM   Specimen: BLOOD  Result Value Ref Range Status   Specimen Description   Final    BLOOD RIGHT ANTECUBITAL Performed at [facility], [address] [place], [place] [address]    Special Requests   Final    BOTTLES DRAWN AEROBIC AND ANAEROBIC Blood Culture adequate volume Performed at [facility], [address] [place], [place] [address]    Culture   Final    NO GROWTH 2 DAYS Performed at [facility], [place] [address], [place], [place] [address]    Report Status PENDING  Incomplete  Blood Culture (routine x 2)     Status: None (Preliminary result)   Collection Time: 09/05/19  9:00 PM   Specimen: BLOOD RIGHT WRIST  Result Value Ref Range Status   Specimen Description   Final    BLOOD RIGHT WRIST Performed at [facility], [address] [place], [place] [address]    Special Requests   Final    BOTTLES DRAWN AEROBIC AND ANAEROBIC Blood Culture adequate volume Performed at [facility], [address] [place], [place] [address]    Culture   Final    NO GROWTH 2 DAYS Performed at [facility], [place] [address] [place], [place] [address]    Report Status PENDING  Incomplete  Urine culture     Status: Abnormal   Collection Time: 09/05/19 10:40 PM   Specimen: In/Out Cath Urine  Result Value Ref Range Status   Specimen Description   Final    IN/OUT CATH URINE Performed at [facility], [address] [place], [place] [address]    Special Requests   Final    NONE Performed at [facility], [address] [place], [place] [address]    Culture (A)  Final    40,000 COLONIES/mL STREPTOCOCCUS GALLOLYTICUS Standardized susceptibility testing for this organism is not available. Performed at [facility], [place] [address] [place], [place] [address]    Report Status 09/07/2019 FINAL  Final  SARS CORONAVIRUS 2 (TAT 6-24 HRS) Nasopharyngeal Nasopharyngeal Swab     Status: None   Collection Time: 09/06/19  5:34 AM   Specimen: Nasopharyngeal Swab  Result Value Ref Range Status   SARS Coronavirus 2 NEGATIVE NEGATIVE Final    Comment: (NOTE) SARS-CoV-2 target nucleic acids are NOT DETECTED. The SARS-CoV-2 RNA is generally detectable in upper and lower respiratory specimens during the acute phase of  infection. Negative results do not preclude SARS-CoV-2 infection, do not rule out co-infections with other pathogens, and should not be used as the sole basis for treatment or other patient management decisions. Negative results must be combined with clinical observations, patient history, and epidemiological information. The expected result is Negative. Fact Sheet for Patients: SugarRoll.be Fact Sheet for Healthcare Providers: https://www.woods-mathews.com/ This test is not yet approved or cleared by the [place]  States FDA and  has been authorized for detection and/or diagnosis of SARS-CoV-2 by FDA under an Emergency Use Authorization (EUA). This EUA will remain  in effect (meaning this test can be used) for the duration of the COVID-19 declaration under Section 56 4(b)(1) of the Act, 21 U.S.C. section 360bbb-3(b)(1), unless the authorization is terminated or revoked sooner. Performed at [facility], [place] [address] [place], [place] [address]      Labs: BNP (last 3 results) No results for input(s): BNP in the last 8760 hours. Basic Metabolic Panel: Recent Labs  Lab 09/04/19 1106 09/05/19 2058 09/06/19 0825 09/07/19 0350  NA 134* 136 140 142  K 3.6 3.6 3.4* 4.3  CL 99 100 105 109  CO2 '24 24 24 23  '$ GLUCOSE 109* 206* 157* 111*  BUN 35* 33* 25* 22  CREATININE 2.13* 1.71* 1.55* 1.39*  CALCIUM 8.4* 8.6* 8.2* 8.4*  MG  --   --   --  1.8  PHOS  --   --   --  3.6   Liver Function Tests: Recent Labs  Lab 09/05/19 2058 09/07/19 0350  AST 28  --   ALT 25  --   ALKPHOS 60  --   BILITOT 0.8  --   PROT 8.3*  --   ALBUMIN 3.4* 2.8*   No results for input(s): LIPASE, AMYLASE in the last 168 hours. No results for input(s): AMMONIA in the last 168 hours. CBC: Recent Labs  Lab 09/04/19 1106 09/05/19 2058 09/06/19 0825 09/07/19 0350  WBC 9.7 8.9 9.2 7.9  NEUTROABS 5.6 5.4 5.7 4.4  HGB 10.5* 11.1* 9.6* 9.9*  HCT 31.7* 34.0*  29.7* 31.1*  MCV 89.0 88.3 89.2 89.4  PLT 68* 83* 90* 96*   Cardiac Enzymes: No results for input(s): CKTOTAL, CKMB, CKMBINDEX, TROPONINI in the last 168 hours. BNP: Invalid input(s): POCBNP CBG: Recent Labs  Lab 09/06/19 1111 09/06/19 1708 09/06/19 2121 09/07/19 0749 09/07/19 1102  GLUCAP 163* 110* 109* 118* 142*   D-Dimer No results for input(s): DDIMER in the last 72 hours. Hgb A1c Recent Labs    09/06/19 0825  HGBA1C 7.1*   Lipid Profile No results for input(s): CHOL, HDL, LDLCALC, TRIG, CHOLHDL, LDLDIRECT in the last 72 hours. Thyroid function studies No results for input(s): TSH, T4TOTAL, T3FREE, THYROIDAB in the last 72 hours.  Invalid input(s): FREET3 Anemia work up No results for input(s): VITAMINB12, FOLATE, FERRITIN, TIBC, IRON, RETICCTPCT in the last 72 hours. Urinalysis    Component Value Date/Time   COLORURINE YELLOW 09/05/2019 2240   APPEARANCEUR CLEAR 09/05/2019 2240   LABSPEC 1.010 09/05/2019 2240   PHURINE 5.5 09/05/2019 2240   GLUCOSEU NEGATIVE 09/05/2019 2240   HGBUR MODERATE (A) 09/05/2019 2240   BILIRUBINUR NEGATIVE 09/05/2019 2240   KETONESUR NEGATIVE 09/05/2019 2240   PROTEINUR 30 (A) 09/05/2019 2240   NITRITE NEGATIVE 09/05/2019 2240   LEUKOCYTESUR NEGATIVE 09/05/2019 2240   Sepsis Labs Invalid input(s): PROCALCITONIN,  WBC,  LACTICIDVEN Microbiology Recent Results (from the past 240 hour(s))  SARS CORONAVIRUS 2 (TAT 6-24 HRS) Nasopharyngeal Nasopharyngeal Swab     Status: None   Collection Time: 09/04/19 11:11 AM   Specimen: Nasopharyngeal Swab  Result Value Ref Range Status   SARS Coronavirus 2 NEGATIVE NEGATIVE Final    Comment: (NOTE) SARS-CoV-2 target nucleic acids are NOT DETECTED. The SARS-CoV-2 RNA is generally detectable in upper and lower respiratory specimens during the acute phase of infection. Negative results do not preclude SARS-CoV-2 infection, do not  rule out co-infections with other pathogens, and should not be  used as the sole basis for treatment or other patient management decisions. Negative results must be combined with clinical observations, patient history, and epidemiological information. The expected result is Negative. Fact Sheet for Patients: SugarRoll.be Fact Sheet for Healthcare Providers: https://www.woods-mathews.com/ This test is not yet approved or cleared by the [place] FDA and  has been authorized for detection and/or diagnosis of SARS-CoV-2 by FDA under an Emergency Use Authorization (EUA). This EUA will remain  in effect (meaning this test can be used) for the duration of the COVID-19 declaration under Section 56 4(b)(1) of the Act, 21 U.S.C. section 360bbb-3(b)(1), unless the authorization is terminated or revoked sooner. Performed at [facility], [place] [address] [place], [place] [address]   Blood Culture (routine x 2)     Status: None (Preliminary result)   Collection Time: 09/05/19  8:45 PM   Specimen: BLOOD  Result Value Ref Range Status   Specimen Description   Final    BLOOD RIGHT ANTECUBITAL Performed at [facility], [address] [place], [place] [address]    Special Requests   Final    BOTTLES DRAWN AEROBIC AND ANAEROBIC Blood Culture adequate volume Performed at [facility], [address] [place], [place] [address]    Culture   Final    NO GROWTH 2 DAYS Performed at [facility], [place] [address], [place], [place] [address]    Report Status PENDING  Incomplete  Blood Culture (routine x 2)     Status: None (Preliminary result)   Collection Time: 09/05/19  9:00 PM   Specimen: BLOOD RIGHT WRIST  Result Value Ref Range Status   Specimen Description   Final    BLOOD RIGHT WRIST Performed at [facility], [address] [place], [place] [address]    Special Requests   Final    BOTTLES DRAWN AEROBIC AND ANAEROBIC Blood Culture adequate volume Performed at [facility], [address] [place], [place] [address]    Culture   Final    NO GROWTH 2 DAYS Performed at [facility], [place] [address] [place], [place] [address]    Report Status PENDING  Incomplete  Urine culture     Status: Abnormal   Collection Time: 09/05/19 10:40 PM   Specimen: In/Out Cath Urine  Result Value Ref Range Status   Specimen Description   Final    IN/OUT CATH URINE Performed at [facility], [address] [place], [place] [address]    Special Requests   Final    NONE Performed at [facility], [address] [place], [place] [address]    Culture (A)  Final    40,000 COLONIES/mL STREPTOCOCCUS GALLOLYTICUS Standardized susceptibility testing for this organism is not available. Performed at [facility], [place] [address], [place],  [address]    Report Status 09/07/2019 FINAL  Final  SARS CORONAVIRUS 2 (TAT 6-24 HRS) Nasopharyngeal Nasopharyngeal Swab     Status: None   Collection Time: 09/06/19  5:34 AM   Specimen: Nasopharyngeal Swab  Result Value Ref Range Status   SARS Coronavirus 2 NEGATIVE NEGATIVE Final    Comment: (NOTE) SARS-CoV-2 target nucleic acids are NOT DETECTED. The SARS-CoV-2 RNA is generally detectable in upper and lower respiratory specimens during the acute phase of infection. Negative results do not preclude  SARS-CoV-2 infection, do not rule out co-infections with other pathogens, and should not be used as the sole basis for treatment or other patient management decisions. Negative results must be combined with clinical observations, patient history, and epidemiological information. The expected result is Negative. Fact Sheet for Patients: SugarRoll.be Fact Sheet for Healthcare Providers: https://www.woods-mathews.com/ This test is not yet approved or cleared by the [place] FDA and  has been authorized for detection and/or diagnosis of  SARS-CoV-2 by FDA under an Emergency Use Authorization (EUA). This EUA will remain  in effect (meaning this test can be used) for the duration of the COVID-19 declaration under Section 56 4(b)(1) of the Act, 21 U.S.C. section 360bbb-3(b)(1), unless the authorization is terminated or revoked sooner. Performed at [facility], [place] [address] [place], [place] [address]      Time coordinating discharge: Over 30 minutes  SIGNED:   Arlan Organ, DO  [facility] 09/07/2019, 4:45 PM  If 7PM-7AM, please contact night-coverage [email or web address]

## 2019-09-07 NOTE — Progress Notes (Signed)
Patient had no significant changes. IV site clean/dry/intact. She is alert and oriented x4. Discharge teaching completed. Patient demonstrated she understood the teaching. Will continue to monitor.

## 2019-09-07 NOTE — Progress Notes (Signed)
SATURATION QUALIFICATIONS: (This note is used to comply with regulatory documentation for home oxygen)  Patient Saturations on Room Air at Rest = 98%  Patient Saturations on Room Air while Ambulating = 97%  Patient Saturations on 0 Liters of oxygen while Ambulating = 97%  Please briefly explain why patient needs home oxygen: 

## 2019-09-08 LAB — C4 COMPLEMENT: Complement C4, Body Fluid: 9 mg/dL — ABNORMAL LOW (ref 12–38)

## 2019-09-09 ENCOUNTER — Encounter: Payer: Self-pay | Admitting: Family Medicine

## 2019-09-09 ENCOUNTER — Telehealth: Payer: Self-pay

## 2019-09-09 DIAGNOSIS — Z91199 Patient's noncompliance with other medical treatment and regimen due to unspecified reason: Secondary | ICD-10-CM | POA: Insufficient documentation

## 2019-09-09 DIAGNOSIS — Z9289 Personal history of other medical treatment: Secondary | ICD-10-CM | POA: Insufficient documentation

## 2019-09-09 DIAGNOSIS — Z9119 Patient's noncompliance with other medical treatment and regimen: Secondary | ICD-10-CM | POA: Insufficient documentation

## 2019-09-09 LAB — LEGIONELLA PNEUMOPHILA SEROGP 1 UR AG: L. pneumophila Serogp 1 Ur Ag: NEGATIVE

## 2019-09-09 LAB — ANCA TITERS
Atypical P-ANCA titer: <1:20 {titer}
C-ANCA: <1:20 {titer}
P-ANCA: <1:20 {titer}

## 2019-09-09 NOTE — Telephone Encounter (Signed)
Attempted to contact patient to complete TCM and schedule hospital follow up. VM not set up.

## 2019-09-10 LAB — CULTURE, BLOOD (ROUTINE X 2)
Culture: NO GROWTH
Culture: NO GROWTH
Special Requests: ADEQUATE
Special Requests: ADEQUATE

## 2019-09-10 LAB — CRYOGLOBULIN

## 2019-09-10 NOTE — Telephone Encounter (Signed)
Second attempt to contact patient to complete TCM call and schedule hosp f/u.

## 2019-10-25 ENCOUNTER — Ambulatory Visit: Payer: Medicare HMO

## 2020-01-01 DIAGNOSIS — J019 Acute sinusitis, unspecified: Secondary | ICD-10-CM | POA: Diagnosis not present

## 2020-01-08 ENCOUNTER — Ambulatory Visit (INDEPENDENT_AMBULATORY_CARE_PROVIDER_SITE_OTHER): Payer: Medicare HMO | Admitting: Family Medicine

## 2020-01-08 ENCOUNTER — Other Ambulatory Visit: Payer: Self-pay

## 2020-01-08 ENCOUNTER — Encounter: Payer: Self-pay | Admitting: Family Medicine

## 2020-01-08 VITALS — BP 112/72 | HR 80 | Temp 98.2°F | Resp 16 | Ht 63.0 in | Wt 180.4 lb

## 2020-01-08 DIAGNOSIS — G4700 Insomnia, unspecified: Secondary | ICD-10-CM

## 2020-01-08 DIAGNOSIS — E1140 Type 2 diabetes mellitus with diabetic neuropathy, unspecified: Secondary | ICD-10-CM

## 2020-01-08 DIAGNOSIS — R591 Generalized enlarged lymph nodes: Secondary | ICD-10-CM | POA: Diagnosis not present

## 2020-01-08 DIAGNOSIS — R590 Localized enlarged lymph nodes: Secondary | ICD-10-CM | POA: Diagnosis not present

## 2020-01-08 DIAGNOSIS — E538 Deficiency of other specified B group vitamins: Secondary | ICD-10-CM

## 2020-01-08 DIAGNOSIS — R599 Enlarged lymph nodes, unspecified: Secondary | ICD-10-CM

## 2020-01-08 DIAGNOSIS — I10 Essential (primary) hypertension: Secondary | ICD-10-CM | POA: Diagnosis not present

## 2020-01-08 LAB — BASIC METABOLIC PANEL
BUN: 17 mg/dL (ref 6–23)
CO2: 33 mEq/L — ABNORMAL HIGH (ref 19–32)
Calcium: 9.3 mg/dL (ref 8.4–10.5)
Chloride: 101 mEq/L (ref 96–112)
Creatinine, Ser: 0.81 mg/dL (ref 0.40–1.20)
GFR: 70.24 mL/min (ref >60.00)
Glucose, Bld: 138 mg/dL — ABNORMAL HIGH (ref 70–99)
Potassium: 4.3 mEq/L (ref 3.5–5.1)
Sodium: 138 mEq/L (ref 135–145)

## 2020-01-08 LAB — CBC WITH DIFFERENTIAL/PLATELET
Basophils Absolute: 0 10*3/uL (ref 0.0–0.1)
Basophils Relative: 0.4 % (ref 0.0–3.0)
Eosinophils Absolute: 0 10*3/uL (ref 0.0–0.7)
Eosinophils Relative: 0.2 % (ref 0.0–5.0)
HCT: 35.1 % — ABNORMAL LOW (ref 36.0–46.0)
Hemoglobin: 12 g/dL (ref 12.0–15.0)
Lymphocytes Relative: 18.7 % (ref 12.0–46.0)
Lymphs Abs: 1.3 10*3/uL (ref 0.7–4.0)
MCHC: 34.1 g/dL (ref 30.0–36.0)
MCV: 85.3 fl (ref 78.0–100.0)
Monocytes Absolute: 1.5 10*3/uL — ABNORMAL HIGH (ref 0.1–1.0)
Monocytes Relative: 21.8 % — ABNORMAL HIGH (ref 3.0–12.0)
Neutro Abs: 4.1 10*3/uL (ref 1.4–7.7)
Neutrophils Relative %: 58.9 % (ref 43.0–77.0)
Platelets: 61 10*3/uL — ABNORMAL LOW (ref 150.0–400.0)
RBC: 4.11 Mil/uL (ref 3.87–5.11)
RDW: 14.7 % (ref 11.5–15.5)
WBC: 7 10*3/uL (ref 4.0–10.5)

## 2020-01-08 LAB — POCT GLYCOSYLATED HEMOGLOBIN (HGB A1C)
HbA1c POC (<> result, manual entry): 7.2 % (ref 4.0–5.6)
HbA1c, POC (controlled diabetic range): 7.2 % — AB (ref 0.0–7.0)
HbA1c, POC (prediabetic range): 7.2 % — AB (ref 5.7–6.4)
Hemoglobin A1C: 7.2 % — AB (ref 4.0–5.6)

## 2020-01-08 LAB — C-REACTIVE PROTEIN: CRP: <1 mg/dL (ref 0.5–20.0)

## 2020-01-08 LAB — TSH: TSH: 2.35 u[IU]/mL (ref 0.35–4.50)

## 2020-01-08 MED ORDER — HYDROCHLOROTHIAZIDE 12.5 MG PO TABS: 12.5000 mg | ORAL_TABLET | Freq: Every day | ORAL | 1 refills | Status: DC

## 2020-01-08 MED ORDER — GLYBURIDE 2.5 MG PO TABS: 2.5000 mg | ORAL_TABLET | Freq: Every day | ORAL | 1 refills | Status: DC

## 2020-01-08 MED ORDER — GABAPENTIN 300 MG PO CAPS: ORAL_CAPSULE | ORAL | 1 refills | Status: DC

## 2020-01-08 MED ORDER — ZOLPIDEM TARTRATE 5 MG PO TABS: 5.0000 mg | ORAL_TABLET | Freq: Every day | ORAL | 1 refills | Status: DC

## 2020-01-08 MED ORDER — CYANOCOBALAMIN 1000 MCG/ML IJ SOLN
1000.0000 ug | Freq: Once | INTRAMUSCULAR | Status: AC
  Administered 2020-01-08: 1000 ug via INTRAMUSCULAR

## 2020-01-08 MED ORDER — METOPROLOL SUCCINATE ER 50 MG PO TB24: 50.0000 mg | ORAL_TABLET | Freq: Every day | ORAL | 1 refills | Status: DC

## 2020-01-08 MED ORDER — SITAGLIPTIN PHOS-METFORMIN HCL 50-1000 MG PO TABS: 1.0000 | ORAL_TABLET | Freq: Every day | ORAL | 1 refills | Status: DC

## 2020-01-08 NOTE — Progress Notes (Signed)
Patient ID: Kathleen Gibson, female  DOB: [birth year], 69 y.o.   MRN: [id number] Patient Care Team    Relationship Specialty Notifications Start End  [name], DO PCP - General Family Medicine  11/08/17   Doran Stabler, MD Consulting Physician Gastroenterology  04/06/17   Truitt Merle, MD Consulting Physician Hematology  04/06/17   Alda Berthold, [place] Physician Neurology  01/16/19     Chief Complaint  Patient presents with  . Diabetes    Needs refills. Pt went to urgent care last week and a sinus infection.   . Hypertension    Subjective:  Kathleen Gibson is a 69 y.o.  female present for [facility] follow up.  Diabetes: Pt reports compliance with janumet and glipizide 2.5 mg.   Patient denies dizziness, hyperglycemic or hypoglycemic events. Patient denies numbness, tingling in the extremities or nonhealing wounds of feet.  PNA series: PNA23 06/2017, repeat prevnar 06/2018 Flu shot: Patient declined (recommneded yearly) BMP: Last GFR was severely decreased after admission for sepsis. Foot exam: completed today  Eye exam: 8/3/22020 completed- vision works at [employer]. A1c: 8..6--> 8.3--> 7.9--> 7.2--> 7.3 --> 6.6 >>> 6.9>> 6.7> 7.1> 7.2 today  Hypertension/hypertriglyceridemia Pt reports compliance with toprol xl 50 mg qd and HCTZ 12.5 mg QD. Patient denies chest pain, shortness of breath, dizziness or lower extremity edema.   Pt does not take a daily baby ASA (low platelets). Pt is not prescribed statin. RF: Hypertension, diabetes, hyperlipidemia, obesity  INSOMNIA, CHRONIC/RLS Ambien 5 mg not working as well as 10 mg. Gabapentin-900 mg QHS is working ok for RLS, but not always.  Regimen works okay for her, could use improvement.  Only sleeping a few hours at a time.  She would however like to stay on regimen today.  Chronic ITP (idiopathic thrombocytopenia) (HCC)/lymphadenopathy Labs 11/07/2017 plts dropped to 34.  Since that time they have stabilized averaging around  80.  She saw her heme in June 2020. Per their note she would be managed clinically as long as remained above 30K. Otherwise she would need referred to surgery for splenectomy.  She denies increased bleeding or bruising today.  She has not had any recurrence of her rash.   During her hospital admission she had a CT which incidentally noted worsening lymphadenopathy of her abdomen and pelvis. CT:09/05/2019:. Interval worsening adenopathy over the upper abdomen, mesentery, periaortic region and iliac chains as described. Adenopathy is worse over the region of the celiac axis with lymph nodes measuring up to 1.8 cm by short axis. Findings are concerning for a neoplastic Process. ANCA negative Cryoglobulin negative ANA negative C4 complement 9-mildly low  b12 deficiency/pernicious anemia/myalgia/weakness:  She has not had a B12 injection since prior to her hospital admission in March.  She would like to restart today with B12 injections every 4 weeks. She states since her ED visit 2020, her legs and arms have continue to cause discomfort.  She has been evaluated by neurology January 21, 2019.  It was felt by the neurology team she was experiencing diabetic peripheral neuropathy involving a stocking glove distribution.  SHe is found to have distal predominant large fiber peripheral neuropathy on their exam.  Neurology felt follow-up only if patient's symptoms became worse, and at that time they would recommend EMG/nerve conduction testing   Depression screen [facility] 2/9 04/15/2019 04/11/2018 10/25/2017 04/06/2017 08/27/2014  Decreased Interest 0 0 0 0 0  Down, Depressed, Hopeless 0 - 0 0 0  PHQ - 2 Score 0 0 0 0 0   No flowsheet data found.     Fall Risk  04/15/2019 01/16/2019 04/11/2018 04/06/2017  Falls in the past year? 0 1 No No  Number falls in past yr: 0 0 - -  Injury with Fall? 0 0 - -  Follow up Falls evaluation completed - - -    Immunization History  Administered Date(s) Administered  .  PFIZER SARS-COV-2 Vaccination 07/05/2019, 07/26/2019  . Pneumococcal Conjugate-13 03/19/2015  . Pneumococcal Polysaccharide-23 06/03/2008, 07/11/2017  . Td 06/13/2006    No exam data present  Past Medical History:  Diagnosis Date  . Diabetes ([place])   . HLD (hyperlipidemia)   . Hypertension   . Pernicious anemia   . Vitamin D deficiency    Allergies  Allergen Reactions  . Asa [Aspirin] Other (See Comments)    Contraindicated d/t low plts  . Nsaids Other (See Comments)    Contraindicated d/t low plts   Past Surgical History:  Procedure Laterality Date  . ABDOMINAL HERNIA REPAIR  2009  . ABDOMINOPLASTY    . [place]  . GASTRIC BYPASS  2000  . hemorroid surgery  2007  . TONSILLECTOMY AND ADENOIDECTOMY  1957   Family History  Problem Relation Age of Onset  . Cancer Mother 52       unknown type cancer   . Hypertension Mother   . Hypertension Father   . Heart failure Father   . Pneumonia Father   . Diabetes Sister   . Leukemia Brother   . Colon cancer Brother 101  . Heart Problems Brother   . Leukemia Other   . Diabetes Sister   . Heart Problems Sister   . Cancer Brother   . Heart Problems Brother    Social History   Socioeconomic History  . Marital status: Divorced    Spouse name: Not on file  . Number of children: 3  . Years of education: 4  . Highest education level: Not on file  Occupational History  . Occupation: Retired  Tobacco Use  . Smoking status: Never Smoker  . Smokeless tobacco: Never Used  Vaping Use  . Vaping Use: Never used  Substance and Sexual Activity  . Alcohol use: No  . Drug use: No  . Sexual activity: Not on file  Other Topics Concern  . Not on file  Social History Narrative   Divorced. Retired Pension scheme manager.   12th grade education.   Drinks caffeine.   Smoke alarm in the home. Wears her seatbelt.   Feels safe in her relationships.   Two story home   Right handed    Social Determinants of Health    Financial Resource Strain:   . Difficulty of Paying Living Expenses:   Food Insecurity:   . Worried About Charity fundraiser in the Last Year:   . Arboriculturist in the Last Year:   Transportation Needs:   . Film/video editor (Medical):   [name] Lack of Transportation (Non-Medical):   Physical Activity:   . Days of Exercise per Week:   . Minutes of Exercise per Session:   Stress:   . Feeling of Stress :   Social Connections:   . Frequency of Communication with Friends and Family:   . Frequency of Social Gatherings with Friends and Family:   . Attends Religious Services:   . Active Member of Clubs or Organizations:   . Attends Archivist  Meetings:   [name] Marital Status:   Intimate Partner Violence:   . Fear of Current or Ex-Partner:   . Emotionally Abused:   [name] Physically Abused:   . Sexually Abused:    Allergies as of 01/08/2020      Reactions   Asa [aspirin] Other (See Comments)   Contraindicated d/t low plts   Nsaids Other (See Comments)   Contraindicated d/t low plts      Medication List       Accurate as of January 08, 2020 11:59 PM. If you have any questions, ask your nurse or doctor.        STOP taking these medications   amoxicillin-clavulanate 875-125 MG tablet Commonly known as: AUGMENTIN Stopped by: Howard Pouch, DO   cefpodoxime 200 MG tablet Commonly known as: VANTIN Stopped by: Howard Pouch, DO     TAKE these medications   acetaminophen 500 MG tablet Commonly known as: TYLENOL Take 1,000 mg by mouth every 6 (six) hours as needed for headache (pain).   ferrous sulfate 325 (65 FE) MG tablet Take 1 tablet (325 mg total) by mouth 2 (two) times daily with a meal.   Fish Oil 1000 MG Caps Take 1,000 mg by mouth daily.   gabapentin 300 MG capsule Commonly known as: NEURONTIN 900 my QHS and 300 mg every 8 hrs in day What changed:   how much to take  how to take this  when to take this  additional instructions   glucose monitoring  kit monitoring kit 1 each by Does not apply route as needed for other. Use as directed   glyBURIDE 2.5 MG tablet Commonly known as: DIABETA Take 1 tablet (2.5 mg total) by mouth daily with breakfast.   hydrochlorothiazide 12.5 MG tablet Commonly known as: HYDRODIURIL Take 1 tablet (12.5 mg total) by mouth daily.   metoprolol succinate 50 MG 24 hr tablet Commonly known as: TOPROL-XL Take 1 tablet (50 mg total) by mouth daily.   sitaGLIPtin-metformin 50-1000 MG tablet Commonly known as: JANUMET Take 1 tablet by mouth at bedtime.   Vitamin D (Cholecalciferol) 25 MCG (1000 UT) Caps Take 1,000 Units by mouth daily.   zolpidem 5 MG tablet Commonly known as: AMBIEN Take 1 tablet (5 mg total) by mouth at bedtime.       All past medical history, surgical history, allergies, family history, immunizations andmedications were updated in the EMR today and reviewed under the history and medication portions of their EMR.      ROS: 14 pt review of systems performed and negative (unless mentioned in an HPI)  Objective: BP 112/72 (BP Location: Left Arm, Patient Position: Sitting, Cuff Size: Normal)   Pulse 80   Temp 98.2 F (36.8 C) (Temporal)   Resp 16   Ht _0  (1.6 m)   Wt 180 lb 6 oz (81.8 kg)   SpO2 95%   BMI 31.95 kg/m  Gen: Afebrile. No acute distress.  HENT: AT. [place].  Eyes:Pupils Equal Round Reactive to light, Extraocular movements intact,  Conjunctiva without redness, discharge or icterus. Neck/lymp/endocrine: Supple, no lymphadenopathy, no thyromegaly CV: RRR no murmur, no edema, +2/4 P posterior tibialis pulses Chest: CTAB, no wheeze or crackles Skin: No rashes, purpura or petechiae.  Neuro:  Normal gait. PERLA. EOMi. Alert. Oriented x3 Psych: Tearful at times, otherwise normal affect, dress and demeanor. Normal speech. Normal thought content and judgment.   Results for orders placed or performed in visit on 01/08/20 (from the past 48 hour(s))  POCT glycosylated  hemoglobin (Hb A1C)     Status: Abnormal   Collection Time: 01/08/20  8:35 AM  Result Value Ref Range   Hemoglobin A1C 7.2 (A) 4.0 - 5.6 %   HbA1c POC (<> result, manual entry) 7.2 4.0 - 5.6 %   HbA1c, POC (prediabetic range) 7.2 (A) 5.7 - 6.4 %   HbA1c, POC (controlled diabetic range) 7.2 (A) 0.0 - 7.0 %  Basic Metabolic Panel (BMET)     Status: Abnormal   Collection Time: 01/08/20  8:57 AM  Result Value Ref Range   Sodium 138 135 - 145 mEq/L   Potassium 4.3 3.5 - 5.1 mEq/L   Chloride 101 96 - 112 mEq/L   CO2 33 (H) 19 - 32 mEq/L   Glucose, Bld 138 (H) 70 - 99 mg/dL   BUN 17 6 - 23 mg/dL   Creatinine, Ser 0.81 0.40 - 1.20 mg/dL   GFR 70.24 >60.00 mL/min   Calcium 9.3 8.4 - 10.5 mg/dL  CBC w/Diff     Status: Abnormal   Collection Time: 01/08/20  8:57 AM  Result Value Ref Range   WBC 7.0 4.0 - 10.5 K/uL   RBC 4.11 3.87 - 5.11 Mil/uL   Hemoglobin 12.0 12.0 - 15.0 g/dL   HCT 35.1 (L) 36 - 46 %   MCV 85.3 78.0 - 100.0 fl   MCHC 34.1 30.0 - 36.0 g/dL   RDW 14.7 11.5 - 15.5 %   Platelets 61.0 (L) 150 - 400 K/uL   Neutrophils Relative % 58.9 43 - 77 %   Lymphocytes Relative 18.7 12 - 46 %   Monocytes Relative 21.8 Repeated and verified X2. (H) 3 - 12 %   Eosinophils Relative 0.2 0 - 5 %   Basophils Relative 0.4 0 - 3 %   Neutro Abs 4.1 1.4 - 7.7 K/uL   Lymphs Abs 1.3 0.7 - 4.0 K/uL   Monocytes Absolute 1.5 (H) 0 - 1 K/uL   Eosinophils Absolute 0.0 0 - 0 K/uL   Basophils Absolute 0.0 0 - 0 K/uL  C-reactive protein     Status: None   Collection Time: 01/08/20  8:57 AM  Result Value Ref Range   CRP <1.0 0.5 - 20.0 mg/dL  TSH     Status: None   Collection Time: 01/08/20  8:57 AM  Result Value Ref Range   TSH 2.35 0.35 - 4.50 uIU/mL    Assessment/plan: Kathleen Gibson is a 69 y.o. female present for establishment of care (from [place]) and chronic medical conditions. Essential hypertension/hyperlipidemia/tachycardia/obesity/statin declined -Stable.  HR improved with increase in  metoprolol.  -Continue metoprolol to 50 mg QD -Continue HCTZ 12.5 mg daily. CBC, CMP, TSH collected today - Low-sodium diet, exercise. 150 minutes a week - ASA contraindicated with thrombocytopenia.  - continue fish oil. Statin declined - F/U 4 mos  Diabetic polyneuropathy associated with type 2 diabetes mellitus ([place]) type 2 diabetes mellitus (HCC)/diabetes type 2 -A1c mildly increased to 7.2. - continue to decrease carb and sugar. Increase exercise.  -Continue Janumet 50-1000 mg QD.  -Continue glyburide 2.5 mg - hold if fasting sugar less than 80 -Continue gabapentin (doses below) - statin declined PNA series: PNA23 06/2017, repeat prevnar 06/2018 Flu shot: Patient declined (recommneded yearly) BMP: Last GFR was severely decreased after admission for sepsis. Foot exam: completed today  Eye exam: 8/3/22020 completed- vision works at [employer]. A1c: 8..6--> 8.3--> 7.9--> 7.2--> 7.3 --> 6.6 >>> 6.9>> 6.7> 7.1> 7.2 today -  F/U 4 mos.   INSOMNIA, CHRONIC/RLS -Could use some improvement.  However she is hesitant to change the regimen.   Continue Ambien 5 mg nightly -Continue gabapentin 900 qhs, added gabapentin 300 mg q 8 in the day. - taking iron daily.  Last iron panel 12/10/2018.  Then normal limits. - tried trazodone- not effective.  - ambien refilled today. [facility] database reviewed 01/09/20  Thrombocytopenia (HCC)/ITP/rash/abdominal and pelvic lymphadenopathy  She has had elevated monocytes for the past 5 years.  She has been complaining of quite a bit of fatigue and myalgias as well, that has been constant. Her platelets have been chronically low averaging about 80-100  Her hematologist suggested considering a bone biopsy at her last appointment.  Discussed today with her in detail, there is a possibility the cause of her TTP and  prior + SPEP for light chains could potentially caused by same process and it could be malignant in nature. Now with worsening abdominal and pelvic  lymphadenopathy that is more concerning for a malignant process causing the constellation of her symptoms.  Lengthy discussion with patient today.  She shared with this provider she does not feel she would want further evaluation or treatment if she does have a type of cancer.  She states she would not want to worry her family.  She reports she has lived a good 65 years and if she has cancer, she would rather enjoy whatever year she has left and not move forward with any type of treatment.   She is agreeable to the CT scan for further evaluation of the abdominal and pelvic lymphadenopathy in the event had something else other than cancer that is treatable. Patient does not have a  healthcare power of attorney or advanced directives in place.  I encouraged her to ensure she appoints a healthcare power of attorney and completes advanced directive/living will, especially given her wishes of no treatment and little intervention-in the event of possible severe illness. CBC, CMP, CRP CT with contrast abdomen and pelvis ordered today    B12 deficiency/pernicious anemia/IDA/: Restart B12 injections today, continue q. 4 weeks per nurse visit injection    .Return in about 4 months (around 05/10/2020).  Orders Placed This Encounter  Procedures  . CT Abdomen Pelvis W Contrast  . Basic Metabolic Panel (BMET)  . CBC w/Diff  . C-reactive protein  . TSH  . POCT glycosylated hemoglobin (Hb A1C)   Meds ordered this encounter  Medications  . sitaGLIPtin-metformin (JANUMET) 50-1000 MG tablet    Sig: Take 1 tablet by mouth at bedtime.    Dispense:  90 tablet    Refill:  1  . metoprolol succinate (TOPROL-XL) 50 MG 24 hr tablet    Sig: Take 1 tablet (50 mg total) by mouth daily.    Dispense:  90 tablet    Refill:  1    Please remind patient this is now ONCE a day (changed from tartrate to succinate)  . hydrochlorothiazide (HYDRODIURIL) 12.5 MG tablet    Sig: Take 1 tablet (12.5 mg total) by mouth daily.     Dispense:  90 tablet    Refill:  1  . glyBURIDE (DIABETA) 2.5 MG tablet    Sig: Take 1 tablet (2.5 mg total) by mouth daily with breakfast.    Dispense:  90 tablet    Refill:  1  . gabapentin (NEURONTIN) 300 MG capsule    Sig: 900 my QHS and 300 mg every 8 hrs in day  Dispense:  450 capsule    Refill:  1  . zolpidem (AMBIEN) 5 MG tablet    Sig: Take 1 tablet (5 mg total) by mouth at bedtime.    Dispense:  90 tablet    Refill:  1  . cyanocobalamin ((VITAMIN B-12)) injection 1,000 mcg   > 45 Minutes was dedicated to this patient's encounter to include pre-visit review of chart, face-to-face time with patient and post-visit work- which include documentation and prescribing medications and/or ordering test when necessary covering multiple chronic conditions and new worsening lymphadenopathy, as well as counseling..    Note is dictated utilizing voice recognition software. Although note has been proof read prior to signing, occasional typographical errors still can be missed. If any questions arise, please do not hesitate to call for verification.  Electronically signed by: Howard Pouch, DO [place]

## 2020-01-08 NOTE — Patient Instructions (Signed)
Nice to see you today.  I have refilled your meds for you.  Follow up in 4 mos.   They will call you to schedule CT

## 2020-01-09 ENCOUNTER — Telehealth: Payer: Self-pay | Admitting: Family Medicine

## 2020-01-09 DIAGNOSIS — R590 Localized enlarged lymph nodes: Secondary | ICD-10-CM | POA: Insufficient documentation

## 2020-01-09 NOTE — Telephone Encounter (Signed)
Please call patient Kathleen Gibson, Kathleen Gibson and thyroid function are normal.  Her Kathleen Gibson function has completely recovered from her hospitalization. Her inflammatory marker was significantly high during hospitalization has returned to normal. Hemoglobin levels have returned to normal range.  Her platelets are significantly low at 61 (she usually runs 80-100). Diabetes /A1c is normal at 7.2  Follow-up in 4 months on chronic conditions with provider. We restarted her B12 injections for her yesterday's appointment.  Remind her to set up every 4-week appointments if she wants to continue the B12 injections by nurse visit.  They will call her to get her scheduled for her CT to further evaluate the enlarged lymph nodes in her abdomen.  Once I receive the results we will call her and discuss.

## 2020-01-09 NOTE — Telephone Encounter (Signed)
Patient advised and voiced understanding. She has been scheduled for 4 month [facility] provider appt and the next 3 monthly b12 injections.

## 2020-02-06 ENCOUNTER — Ambulatory Visit (INDEPENDENT_AMBULATORY_CARE_PROVIDER_SITE_OTHER): Payer: Medicare HMO

## 2020-02-06 ENCOUNTER — Other Ambulatory Visit: Payer: Self-pay

## 2020-02-06 DIAGNOSIS — E538 Deficiency of other specified B group vitamins: Secondary | ICD-10-CM

## 2020-02-06 MED ORDER — CYANOCOBALAMIN 1000 MCG/ML IJ SOLN
1000.0000 ug | Freq: Once | INTRAMUSCULAR | Status: AC
  Administered 2020-02-06: 1000 ug via INTRAMUSCULAR

## 2020-02-06 NOTE — Progress Notes (Signed)
Kathleen Gibson is a 69 y.o. female presents to the office today for monthly b12 injections, per physician's orders. Original order: every 4-week appointments if she wants to continue the B12 injections by nurse visit. Cyanocobalamin, 1054mcg, IM was administered left deltoid today. Patient tolerated injection. Patient due for follow up labs/provider appt: Yes. Date due: 05/22/20, appt made Yes Patient next injection due: 03/05/20, appt made Yes  Lauree Chandler D Nanie Dunkleberger

## 2020-02-29 DIAGNOSIS — R300 Dysuria: Secondary | ICD-10-CM | POA: Diagnosis not present

## 2020-02-29 DIAGNOSIS — M5431 Sciatica, right side: Secondary | ICD-10-CM | POA: Diagnosis not present

## 2020-03-05 ENCOUNTER — Ambulatory Visit (INDEPENDENT_AMBULATORY_CARE_PROVIDER_SITE_OTHER): Payer: Medicare HMO

## 2020-03-05 ENCOUNTER — Other Ambulatory Visit: Payer: Self-pay

## 2020-03-05 DIAGNOSIS — E538 Deficiency of other specified B group vitamins: Secondary | ICD-10-CM | POA: Diagnosis not present

## 2020-03-05 MED ORDER — CYANOCOBALAMIN 1000 MCG/ML IJ SOLN
1000.0000 ug | Freq: Once | INTRAMUSCULAR | Status: AC
  Administered 2020-03-05: 1000 ug via INTRAMUSCULAR

## 2020-03-05 NOTE — Progress Notes (Signed)
presents to the office today for monthly b12 injections, per physician's orders. Original order: every 4-week appointments if she wants to continue the B12 injections by nurse visit. Cyanocobalamin, 1036mcg, IM was administered left deltoid today. Patient tolerated injection. Patient due for follow up labs/provider appt: Yes. Date due: 05/22/20, appt made Yes Patient next injection due: 04/02/20, appt made Yes  Octaviano Glow

## 2020-03-06 ENCOUNTER — Ambulatory Visit: Payer: Medicare HMO

## 2020-04-02 ENCOUNTER — Ambulatory Visit (INDEPENDENT_AMBULATORY_CARE_PROVIDER_SITE_OTHER): Payer: Medicare HMO

## 2020-04-02 ENCOUNTER — Other Ambulatory Visit: Payer: Self-pay

## 2020-04-02 DIAGNOSIS — E538 Deficiency of other specified B group vitamins: Secondary | ICD-10-CM

## 2020-04-02 MED ORDER — CYANOCOBALAMIN 1000 MCG/ML IJ SOLN
1000.0000 ug | Freq: Once | INTRAMUSCULAR | Status: AC
  Administered 2020-04-02: 1000 ug via INTRAMUSCULAR

## 2020-04-02 NOTE — Progress Notes (Signed)
  Kathleen Gibson is a 69 y.o. female presents to the office today for monthly b12 injections, per physician's orders. Original order: every 4-week appointments if she wants to continue the B12 injections by nurse visit. Cyanocobalamin, 1071mcg, IM was administered right deltoid today. Patient tolerated injection. Patient due for follow up labs/provider appt: Yes. Date due: 05/22/20, appt made Yes Patient next injection due: N/A, appt made N/A  Shepard General, CMA

## 2020-04-16 ENCOUNTER — Other Ambulatory Visit: Payer: Self-pay

## 2020-04-16 MED ORDER — FERROUS SULFATE 325 (65 FE) MG PO TABS: 325.0000 mg | ORAL_TABLET | Freq: Two times a day (BID) | ORAL | 5 refills | Status: DC

## 2020-05-21 ENCOUNTER — Other Ambulatory Visit: Payer: Self-pay | Admitting: Family Medicine

## 2020-05-22 ENCOUNTER — Ambulatory Visit (INDEPENDENT_AMBULATORY_CARE_PROVIDER_SITE_OTHER): Payer: Medicare HMO | Admitting: Family Medicine

## 2020-05-22 ENCOUNTER — Telehealth: Payer: Self-pay

## 2020-05-22 ENCOUNTER — Other Ambulatory Visit: Payer: Self-pay

## 2020-05-22 ENCOUNTER — Encounter: Payer: Self-pay | Admitting: Family Medicine

## 2020-05-22 VITALS — BP 106/69 | HR 76 | Temp 98.1°F | Ht 63.0 in | Wt 178.0 lb

## 2020-05-22 DIAGNOSIS — R000 Tachycardia, unspecified: Secondary | ICD-10-CM

## 2020-05-22 DIAGNOSIS — E785 Hyperlipidemia, unspecified: Secondary | ICD-10-CM

## 2020-05-22 DIAGNOSIS — D510 Vitamin B12 deficiency anemia due to intrinsic factor deficiency: Secondary | ICD-10-CM

## 2020-05-22 DIAGNOSIS — E538 Deficiency of other specified B group vitamins: Secondary | ICD-10-CM | POA: Diagnosis not present

## 2020-05-22 DIAGNOSIS — Z7185 Encounter for immunization safety counseling: Secondary | ICD-10-CM

## 2020-05-22 DIAGNOSIS — E611 Iron deficiency: Secondary | ICD-10-CM

## 2020-05-22 DIAGNOSIS — E1140 Type 2 diabetes mellitus with diabetic neuropathy, unspecified: Secondary | ICD-10-CM

## 2020-05-22 DIAGNOSIS — E1142 Type 2 diabetes mellitus with diabetic polyneuropathy: Secondary | ICD-10-CM | POA: Diagnosis not present

## 2020-05-22 DIAGNOSIS — Z5320 Procedure and treatment not carried out because of patient's decision for unspecified reasons: Secondary | ICD-10-CM

## 2020-05-22 DIAGNOSIS — G609 Hereditary and idiopathic neuropathy, unspecified: Secondary | ICD-10-CM | POA: Diagnosis not present

## 2020-05-22 DIAGNOSIS — E119 Type 2 diabetes mellitus without complications: Secondary | ICD-10-CM

## 2020-05-22 DIAGNOSIS — D693 Immune thrombocytopenic purpura: Secondary | ICD-10-CM

## 2020-05-22 DIAGNOSIS — L602 Onychogryphosis: Secondary | ICD-10-CM | POA: Diagnosis not present

## 2020-05-22 DIAGNOSIS — I10 Essential (primary) hypertension: Secondary | ICD-10-CM

## 2020-05-22 DIAGNOSIS — G4700 Insomnia, unspecified: Secondary | ICD-10-CM

## 2020-05-22 LAB — CBC WITH DIFFERENTIAL/PLATELET
Basophils Absolute: 0.1 10*3/uL (ref 0.0–0.1)
Basophils Relative: 0.7 % (ref 0.0–3.0)
Eosinophils Absolute: 0 10*3/uL (ref 0.0–0.7)
Eosinophils Relative: 0.4 % (ref 0.0–5.0)
HCT: 33.3 % — ABNORMAL LOW (ref 36.0–46.0)
Hemoglobin: 11.1 g/dL — ABNORMAL LOW (ref 12.0–15.0)
Lymphocytes Relative: 12.8 % (ref 12.0–46.0)
Lymphs Abs: 1 10*3/uL (ref 0.7–4.0)
MCHC: 33.4 g/dL (ref 30.0–36.0)
MCV: 85.2 fl (ref 78.0–100.0)
Monocytes Absolute: 1.8 10*3/uL — ABNORMAL HIGH (ref 0.1–1.0)
Monocytes Relative: 23.5 % — ABNORMAL HIGH (ref 3.0–12.0)
Neutro Abs: 4.9 10*3/uL (ref 1.4–7.7)
Neutrophils Relative %: 62.6 % (ref 43.0–77.0)
Platelets: 48 10*3/uL — CL (ref 150.0–400.0)
RBC: 3.91 Mil/uL (ref 3.87–5.11)
RDW: 15.8 % — ABNORMAL HIGH (ref 11.5–15.5)
WBC: 7.9 10*3/uL (ref 4.0–10.5)

## 2020-05-22 LAB — POCT GLYCOSYLATED HEMOGLOBIN (HGB A1C)
HbA1c POC (<> result, manual entry): 6.3 % (ref 4.0–5.6)
HbA1c, POC (controlled diabetic range): 6.3 % (ref 0.0–7.0)
HbA1c, POC (prediabetic range): 6.3 % (ref 5.7–6.4)
Hemoglobin A1C: 6.3 % — AB (ref 4.0–5.6)

## 2020-05-22 MED ORDER — ZOLPIDEM TARTRATE 5 MG PO TABS: 5.0000 mg | ORAL_TABLET | Freq: Every day | ORAL | 1 refills | Status: DC

## 2020-05-22 MED ORDER — SITAGLIPTIN PHOS-METFORMIN HCL 50-1000 MG PO TABS: 1.0000 | ORAL_TABLET | Freq: Every day | ORAL | 1 refills | Status: DC

## 2020-05-22 MED ORDER — FERROUS SULFATE 325 (65 FE) MG PO TABS: 325.0000 mg | ORAL_TABLET | Freq: Every day | ORAL | 3 refills | Status: DC

## 2020-05-22 MED ORDER — METOPROLOL SUCCINATE ER 50 MG PO TB24: 50.0000 mg | ORAL_TABLET | Freq: Every day | ORAL | 1 refills | Status: DC

## 2020-05-22 MED ORDER — CYANOCOBALAMIN 1000 MCG/ML IJ SOLN
1000.0000 ug | Freq: Once | INTRAMUSCULAR | Status: AC
  Administered 2020-05-22: 1000 ug via INTRAMUSCULAR

## 2020-05-22 MED ORDER — HYDROCHLOROTHIAZIDE 12.5 MG PO TABS: 12.5000 mg | ORAL_TABLET | Freq: Every day | ORAL | 1 refills | Status: DC

## 2020-05-22 MED ORDER — GLYBURIDE 2.5 MG PO TABS: 2.5000 mg | ORAL_TABLET | Freq: Every day | ORAL | 1 refills | Status: DC

## 2020-05-22 MED ORDER — GABAPENTIN 300 MG PO CAPS: ORAL_CAPSULE | ORAL | 1 refills | Status: DC

## 2020-05-22 NOTE — Patient Instructions (Addendum)
     Pick up sublingual B12- and start at home. You can still get b12 every 4 weeks here also if desired.

## 2020-05-22 NOTE — Telephone Encounter (Signed)
CRITICAL VALUE STICKER  CRITICAL VALUE: Platelet count 48.0  RECEIVER (on-site recipient of call): Lorriane Shire  DATE & TIME NOTIFIED: 1645 05/22/20  MESSENGER (representative from lab): Anselm Lis  MD NOTIFIED: Raoul Pitch  TIME OF NOTIFICATION: 7290  RESPONSE: Noted

## 2020-05-22 NOTE — Progress Notes (Signed)
Patient ID: Kathleen Gibson, female  DOB: [birth year], 69 y.o.   MRN: [id number] Patient Care Team    Relationship Specialty Notifications Start End  [name], DO PCP - General Family Medicine  11/08/17   Doran Stabler, MD Consulting Physician Gastroenterology  04/06/17   Truitt Merle, MD Consulting Physician Hematology  04/06/17   Alda Berthold, [place] Physician Neurology  01/16/19     Chief Complaint  Patient presents with  . Follow-up    [facility]; pt is fasting    Subjective:  Kathleen Gibson is a 69 y.o.  female present for [facility] follow up.  Diabetes: Pt reports compliance  with janumet and glyburide 2.5 mg.   Patient denies dizziness, hyperglycemic or hypoglycemic events. Patient denies numbness, tingling in the extremities or nonhealing wounds of feet.  She is having difficulty trimming her own toenails. She reports bruising frequently after- she also has ITP with plts last about 60 PNA: completed  Flu shot: Patient declined (recommneded yearly) Foot exam: completed today  Eye exam: 8/3/22020 completed- vision works at [employer]. Encouraged her to schedule. A1c: 8..6--> 8.3--> 7.9--> 7.2--> 7.3 --> 6.6 >>> 6.9>> 6.7> 7.1> 7.2>6.3 today  Hypertension/hypertriglyceridemia Pt reports compliance  with toprol xl 50 mg qd and HCTZ 12.5 mg QD.Patient denies chest pain, shortness of breath, dizziness or lower extremity edema.  Pt does not take a daily baby ASA (low platelets). Pt is not prescribed statin. RF: Hypertension, diabetes, hyperlipidemia, obesity  INSOMNIA, CHRONIC/RLS Ambien 5 mg working well. Gabapentin-600- 900 mg QHS is working ok for RLS.  Chronic ITP (idiopathic thrombocytopenia) (HCC)/lymphadenopathy Last labs Plt 61. Labs 11/07/2017 plts dropped to 34.  Since that time they have stabilized averaging around 60-80.  She saw her heme in June 2020. Per their note she would be managed clinically as long as remained above 30K. Otherwise she would need  referred to surgery for splenectomy.  She denies increased bleeding or bruising today, but admits to her "toes turning black" when she trims her toe nails. Discoloration remains for a few days after event. .  She has not had any recurrence of her rash.   During her hospital admission she had a CT which incidentally noted worsening lymphadenopathy of her abdomen and pelvis. CT:09/05/2019:. Interval worsening adenopathy over the upper abdomen, mesentery, periaortic region and iliac chains as described. Adenopathy is worse over the region of the celiac axis with lymph nodes measuring up to 1.8 cm by short axis. Findings are concerning for a neoplastic Process. ANCA negative Cryoglobulin negative ANA negative C4 complement 9-mildly low  b12 deficiency/pernicious anemia:   She would like to have  B12 injection today- last October. She is approved for B12 q 4 weeks . She states since her ED visit 2020, her legs and arms have continue to cause discomfort.  She has been evaluated by neurology January 21, 2019.  It was felt by the neurology team she was experiencing diabetic peripheral neuropathy involving a stocking glove distribution.  SHe is found to have distal predominant large fiber peripheral neuropathy on their exam.  Neurology felt follow-up only if patient's symptoms became worse, and at that time they would recommend EMG/nerve conduction testing   Depression screen [facility] 2/9 05/22/2020 04/15/2019 04/11/2018 10/25/2017 04/06/2017  Decreased Interest 0 0 0 0 0  Down, Depressed, Hopeless 0 0 - 0 0  PHQ - 2 Score 0 0 0 0 0   No flowsheet data found.  Fall Risk  05/22/2020 04/15/2019 01/16/2019 04/11/2018 04/06/2017  Falls in the past year? 0 0 1 No No  Number falls in past yr: 0 0 0 - -  Injury with Fall? 0 0 0 - -  Follow up Falls evaluation completed Falls evaluation completed - - -    Immunization History  Administered Date(s) Administered  . PFIZER SARS-COV-2 Vaccination 07/05/2019,  07/26/2019  . Pneumococcal Conjugate-13 03/19/2015  . Pneumococcal Polysaccharide-23 06/03/2008, 07/11/2017  . Td 06/13/2006    No exam data present  Past Medical History:  Diagnosis Date  . Diabetes ([place])   . HLD (hyperlipidemia)   . Hypertension   . Pernicious anemia   . Pneumonia 09/05/2019  . Renal insufficiency 09/06/2019  . Vitamin D deficiency    Allergies  Allergen Reactions  . Asa [Aspirin] Other (See Comments)    Contraindicated d/t low plts  . Nsaids Other (See Comments)    Contraindicated d/t low plts   Past Surgical History:  Procedure Laterality Date  . ABDOMINAL HERNIA REPAIR  2009  . ABDOMINOPLASTY    . [place]  . GASTRIC BYPASS  2000  . hemorroid surgery  2007  . TONSILLECTOMY AND ADENOIDECTOMY  1957   Family History  Problem Relation Age of Onset  . Cancer Mother 84       unknown type cancer   . Hypertension Mother   . Hypertension Father   . Heart failure Father   . Pneumonia Father   . Diabetes Sister   . Leukemia Brother   . Colon cancer Brother 20  . Heart Problems Brother   . Leukemia Other   . Diabetes Sister   . Heart Problems Sister   . Cancer Brother   . Heart Problems Brother    Social History   Socioeconomic History  . Marital status: Divorced    Spouse name: Not on file  . Number of children: 3  . Years of education: 57  . Highest education level: Not on file  Occupational History  . Occupation: Retired  Tobacco Use  . Smoking status: Never Smoker  . Smokeless tobacco: Never Used  Vaping Use  . Vaping Use: Never used  Substance and Sexual Activity  . Alcohol use: No  . Drug use: No  . Sexual activity: Not on file  Other Topics Concern  . Not on file  Social History Narrative   Divorced. Retired Pension scheme manager.   12th grade education.   Drinks caffeine.   Smoke alarm in the home. Wears her seatbelt.   Feels safe in her relationships.   Two story home   Right handed    Social Determinants  of Health   Financial Resource Strain: Not on file  Food Insecurity: Not on file  Transportation Needs: Not on file  Physical Activity: Not on file  Stress: Not on file  Social Connections: Not on file  Intimate Partner Violence: Not on file   Allergies as of 05/22/2020      Reactions   Asa [aspirin] Other (See Comments)   Contraindicated d/t low plts   Nsaids Other (See Comments)   Contraindicated d/t low plts      Medication List       Accurate as of May 22, 2020 10:23 AM. If you have any questions, ask your nurse or doctor.        STOP taking these medications   acetaminophen 500 MG tablet Commonly known as: TYLENOL Stopped by: Howard Pouch, DO  TAKE these medications   ferrous sulfate 325 (65 FE) MG tablet Take 1 tablet (325 mg total) by mouth daily with breakfast. What changed: when to take this Changed by: Felix Pacini, DO   Fish Oil 1000 MG Caps Take 1,000 mg by mouth daily.   gabapentin 300 MG capsule Commonly known as: NEURONTIN 900 my QHS and 300 mg every 8 hrs in day   glucose monitoring kit monitoring kit 1 each by Does not apply route as needed for other. Use as directed   glyBURIDE 2.5 MG tablet Commonly known as: DIABETA Take 1 tablet (2.5 mg total) by mouth daily with breakfast.   hydrochlorothiazide 12.5 MG tablet Commonly known as: HYDRODIURIL Take 1 tablet (12.5 mg total) by mouth daily.   metoprolol succinate 50 MG 24 hr tablet Commonly known as: TOPROL-XL Take 1 tablet (50 mg total) by mouth daily.   sitaGLIPtin-metformin 50-1000 MG tablet Commonly known as: JANUMET Take 1 tablet by mouth at bedtime.   Vitamin D (Cholecalciferol) 25 MCG (1000 UT) Caps Take 1,000 Units by mouth daily.   zolpidem 5 MG tablet Commonly known as: AMBIEN Take 1 tablet (5 mg total) by mouth at bedtime.       All past medical history, surgical history, allergies, family history, immunizations andmedications were updated in the EMR today and  reviewed under the history and medication portions of their EMR.      ROS: 14 pt review of systems performed and negative (unless mentioned in an HPI)  Objective: BP 106/69   Pulse 76   Temp 98.1 F (36.7 C) (Oral)   Ht 5\' 3"  (1.6 m)   Wt 178 lb (80.7 kg)   SpO2 98%   BMI 31.53 kg/m  Gen: Afebrile. No acute distress. Nontoxic pleasant female.  HENT: AT. [place].  Eyes:Pupils Equal Round Reactive to light, Extraocular movements intact,  Conjunctiva without redness, discharge or icterus. Neck/lymp/endocrine: Supple,no lymphadenopathy, no thyromegaly CV: RRR no murmur, no edema, +2/4 P posterior tibialis pulses Chest: CTAB, no wheeze or crackles Skin: no rashes, purpura or petechiae. Bruising right 2nd toe Neuro: Normal gait. PERLA. EOMi. Alert. Oriented x3 Psych: Normal affect, dress and demeanor. Normal speech. Normal thought content and judgment..  Diabetic Foot Exam - Simple   Simple Foot Form Diabetic Foot exam was performed with the following findings: Yes 05/22/2020 10:19 AM  Visual Inspection No deformities, no ulcerations, no other skin breakdown bilaterally: Yes Sensation Testing See comments: Yes Pulse Check Posterior Tibialis and Dorsalis pulse intact bilaterally: Yes Comments Thick nails. Bruising right 2nd toe.       Results for orders placed or performed in visit on 05/22/20 (from the past 48 hour(s))  POCT HgB A1C     Status: Abnormal   Collection Time: 05/22/20  9:12 AM  Result Value Ref Range   Hemoglobin A1C 6.3 (A) 4.0 - 5.6 %   HbA1c POC (<> result, manual entry) 6.3 4.0 - 5.6 %   HbA1c, POC (prediabetic range) 6.3 5.7 - 6.4 %   HbA1c, POC (controlled diabetic range) 6.3 0.0 - 7.0 %    Assessment/plan: Kathleen Gibson is a 69 y.o. female present for establishment of care (from [place]) and chronic medical conditions. Essential hypertension/hyperlipidemia/tachycardia/obesity/statin declined -stable -continue  metoprolol to 50 mg QD -continue HCTZ 12.5 mg  daily. - Low-sodium diet, exercise. 150 minutes a week - ASA contraindicated with thrombocytopenia.  - continue fish oil. Statin declined - F/U 4 mos  Diabetic polyneuropathy associated with type 2  diabetes mellitus ([place]) type 2 diabetes mellitus (HCC)/diabetes type 2 -A1c mildly increased to 7.2. - continue to decrease carb and sugar. Increase exercise.  -Continue Janumet 50-1000 mg QD.  -Continue glyburide 2.5 mg - hold if fasting sugar less than 80 -Continue gabapentin (doses below) - statin declined PNA series:completed Flu shot: Patient declined (recommneded yearly) Foot exam: completed today >podiatry referral placed for DM foot care.  Eye exam: 8/3/22020 completed- vision works at [employer]. A1c: 8..6--> 8.3--> 7.9--> 7.2--> 7.3 --> 6.6 >>> 6.9>> 6.7> 7.1> 7.2 >6.3 today - F/U 4 mos.   INSOMNIA, CHRONIC/RLS - stable.  continue Ambien 5 mg nightly - continue  gabapentin 900 qhs, added gabapentin 300 mg q 8 in the day. - taking iron daily.  Last iron panel 12/10/2018.  Then normal limits.> collected today - tried trazodone- not effective.  - NCCS database reviewed 05/22/20  Thrombocytopenia (HCC)/ITP//abdominal and pelvic lymphadenopathy  She has had elevated monocytes since  ~20215  She has been complaining of quite a bit of fatigue and myalgias as well, that has been constant. Her platelets have been chronically low averaging about 60-80  Her hematologist suggested considering a bone biopsy at her last appointment.  Discussed today with her in detail, there is a possibility the cause of her TTP and  prior + SPEP for light chains could potentially caused by same process and it could be malignant in nature. Now with worsening abdominal and pelvic lymphadenopathy that is more concerning for a malignant process causing the constellation of her symptoms.  Lengthy discussion with patient today.  She shared with this provider she does not feel she would want further evaluation  or treatment if she does have a type of cancer.  She states she would not want to worry her family.  She reports she has lived a good 87 years and if she has cancer, she would rather enjoy whatever year she has left and not move forward with any type of treatment or evaluation.  CBC collected today    B12 deficiency/pernicious anemia/IDA/: B12 injection today, continue q. 4 weeks per nurse visit injection Pt was also educated on sublingual B12 since she has difficulty making routine nurse appts for injection.     .Return in about 4 months (around 09/20/2020) for [place] (30 min).  Orders Placed This Encounter  Procedures  . CBC w/Diff  . Iron, TIBC and Ferritin Panel  . Ambulatory referral to Podiatry  . POCT HgB A1C   Meds ordered this encounter  Medications  . cyanocobalamin ((VITAMIN B-12)) injection 1,000 mcg  . sitaGLIPtin-metformin (JANUMET) 50-1000 MG tablet    Sig: Take 1 tablet by mouth at bedtime.    Dispense:  90 tablet    Refill:  1  . metoprolol succinate (TOPROL-XL) 50 MG 24 hr tablet    Sig: Take 1 tablet (50 mg total) by mouth daily.    Dispense:  90 tablet    Refill:  1    Please remind patient this is now ONCE a day (changed from tartrate to succinate)  . hydrochlorothiazide (HYDRODIURIL) 12.5 MG tablet    Sig: Take 1 tablet (12.5 mg total) by mouth daily.    Dispense:  90 tablet    Refill:  1  . glyBURIDE (DIABETA) 2.5 MG tablet    Sig: Take 1 tablet (2.5 mg total) by mouth daily with breakfast.    Dispense:  90 tablet    Refill:  1  . gabapentin (NEURONTIN) 300 MG capsule  Sig: 900 my QHS and 300 mg every 8 hrs in day    Dispense:  450 capsule    Refill:  1  . ferrous sulfate 325 (65 FE) MG tablet    Sig: Take 1 tablet (325 mg total) by mouth daily with breakfast.    Dispense:  90 tablet    Refill:  3  . zolpidem (AMBIEN) 5 MG tablet    Sig: Take 1 tablet (5 mg total) by mouth at bedtime.    Dispense:  90 tablet    Refill:  1     Note is dictated  utilizing voice recognition software. Although note has been proof read prior to signing, occasional typographical errors still can be missed. If any questions arise, please do not hesitate to call for verification.  Electronically signed by: Howard Pouch, DO [place]

## 2020-05-22 NOTE — Telephone Encounter (Signed)
Noted.  This is chronic for this patient.  We will await the rest the labs are resulted before calling patient.

## 2020-05-23 LAB — IRON,TIBC AND FERRITIN PANEL
%SAT: 20 % (calc) (ref 16–45)
Ferritin: 88 ng/mL (ref 16–288)
Iron: 61 ug/dL (ref 45–160)
TIBC: 309 mcg/dL (calc) (ref 250–450)

## 2020-06-01 ENCOUNTER — Ambulatory Visit (INDEPENDENT_AMBULATORY_CARE_PROVIDER_SITE_OTHER): Payer: Medicare HMO | Admitting: Podiatry

## 2020-06-01 ENCOUNTER — Encounter: Payer: Self-pay | Admitting: Podiatry

## 2020-06-01 ENCOUNTER — Other Ambulatory Visit: Payer: Self-pay

## 2020-06-01 DIAGNOSIS — M2041 Other hammer toe(s) (acquired), right foot: Secondary | ICD-10-CM | POA: Diagnosis not present

## 2020-06-01 DIAGNOSIS — M2042 Other hammer toe(s) (acquired), left foot: Secondary | ICD-10-CM

## 2020-06-01 DIAGNOSIS — M7742 Metatarsalgia, left foot: Secondary | ICD-10-CM | POA: Diagnosis not present

## 2020-06-01 DIAGNOSIS — E119 Type 2 diabetes mellitus without complications: Secondary | ICD-10-CM | POA: Diagnosis not present

## 2020-06-01 DIAGNOSIS — M79675 Pain in left toe(s): Secondary | ICD-10-CM

## 2020-06-01 DIAGNOSIS — B351 Tinea unguium: Secondary | ICD-10-CM | POA: Diagnosis not present

## 2020-06-01 DIAGNOSIS — D2372 Other benign neoplasm of skin of left lower limb, including hip: Secondary | ICD-10-CM

## 2020-06-01 DIAGNOSIS — M79674 Pain in right toe(s): Secondary | ICD-10-CM

## 2020-06-01 NOTE — Patient Instructions (Addendum)
Look for urea 40% cream or ointment and apply to the thickened dry skin / calluses. This can be bought over the counter, at a pharmacy or online such as [employer]. ° ° °Diabetes Mellitus and Foot Care °Foot care is an important part of your health, especially when you have diabetes. Diabetes may cause you to have problems because of poor blood flow (circulation) to your feet and legs, which can cause your skin to: °· Become thinner and drier. °· Break more easily. °· Heal more slowly. °· Peel and crack. °You may also have nerve damage (neuropathy) in your legs and feet, causing decreased feeling in them. This means that you may not notice minor injuries to your feet that could lead to more serious problems. Noticing and addressing any potential problems early is the best way to prevent future foot problems. °How to care for your feet °Foot hygiene °· Wash your feet daily with warm water and mild soap. Do not use hot water. Then, pat your feet and the areas between your toes until they are completely dry. Do not soak your feet as this can dry your skin. °· Trim your toenails straight across. Do not dig under them or around the cuticle. File the edges of your nails with an emery board or nail file. °· Apply a moisturizing lotion or petroleum jelly to the skin on your feet and to dry, brittle toenails. Use lotion that does not contain alcohol and is unscented. Do not apply lotion between your toes. °Shoes and socks °· Wear clean socks or stockings every day. Make sure they are not too tight. Do not wear knee-high stockings since they may decrease blood flow to your legs. °· Wear shoes that fit properly and have enough cushioning. Always look in your shoes before you put them on to be sure there are no objects inside. °· To break in new shoes, wear them for just a few hours a day. This prevents injuries on your feet. °Wounds, scrapes, corns, and calluses °· Check your feet daily for blisters, cuts, bruises, sores, and  redness. If you cannot see the bottom of your feet, use a mirror or ask someone for help. °· Do not cut corns or calluses or try to remove them with medicine. °· If you find a minor scrape, cut, or break in the skin on your feet, keep it and the skin around it clean and dry. You may clean these areas with mild soap and water. Do not clean the area with peroxide, alcohol, or iodine. °· If you have a wound, scrape, corn, or callus on your foot, look at it several times a day to make sure it is healing and not infected. Check for: °? Redness, swelling, or pain. °? Fluid or blood. °? Warmth. °? Pus or a bad smell. °General instructions °· Do not cross your legs. This may decrease blood flow to your feet. °· Do not use heating pads or hot water bottles on your feet. They may burn your skin. If you have lost feeling in your feet or legs, you may not know this is happening until it is too late. °· Protect your feet from hot and cold by wearing shoes, such as at the beach or on hot pavement. °· Schedule a complete foot exam at least once a year (annually) or more often if you have foot problems. If you have foot problems, report any cuts, sores, or bruises to your health care provider immediately. °Contact a health care   provider if: °· You have a medical condition that increases your risk of infection and you have any cuts, sores, or bruises on your feet. °· You have an injury that is not healing. °· You have redness on your legs or feet. °· You feel burning or tingling in your legs or feet. °· You have pain or cramps in your legs and feet. °· Your legs or feet are numb. °· Your feet always feel cold. °· You have pain around a toenail. °Get help right away if: °· You have a wound, scrape, corn, or callus on your foot and: °? You have pain, swelling, or redness that gets worse. °? You have fluid or blood coming from the wound, scrape, corn, or callus. °? Your wound, scrape, corn, or callus feels warm to the touch. °? You  have pus or a bad smell coming from the wound, scrape, corn, or callus. °? You have a fever. °? You have a red line going up your leg. °Summary °· Check your feet every day for cuts, sores, red spots, swelling, and blisters. °· Moisturize feet and legs daily. °· Wear shoes that fit properly and have enough cushioning. °· If you have foot problems, report any cuts, sores, or bruises to your health care provider immediately. °· Schedule a complete foot exam at least once a year (annually) or more often if you have foot problems. °This information is not intended to replace advice given to you by your health care provider. Make sure you discuss any questions you have with your health care provider. °Document Revised: 02/20/2019 Document Reviewed: 07/01/2016 °Elsevier Patient Education © 2020 [employer] Inc. ° °

## 2020-06-01 NOTE — Progress Notes (Signed)
  Subjective:  Patient ID: Kathleen Gibson, female    DOB: 1950-07-06,  MRN: [id number]  Chief Complaint  Patient presents with  . Nail Problem    Nail trim    69 y.o. female presents with the above complaint. History confirmed with patient.  She has type 2 diabetes which is well controlled.  Not on insulin.  Has never had diabetic shoes.  There is a painful lesion under the second and third toes.  The nails are thickened elongated and she has difficulty cut them at home.  Objective:  Physical Exam: warm, good capillary refill, no trophic changes or ulcerative lesions, normal DP and PT pulses and normal sensory exam.  Thickening of nails x10 with yellow discoloration and elongated with subungual debris Assessment:   1. Diabetes mellitus type II, non insulin dependent (HCC)   2. Pain due to onychomycosis of toenails of both feet   3. Onychomycosis   4. Hammertoe of left foot   5. Hammertoe of right foot   6. Metatarsalgia, left foot   7. Benign tumor of skin of lower limb, including hip, left      Plan:  Patient was evaluated and treated and all questions answered.   Patient educated on diabetes. Discussed proper diabetic foot care and discussed risks and complications of disease. Educated patient in depth on reasons to return to the office immediately should he/she discover anything concerning or new on the feet. All questions answered. Discussed proper shoes as well.  Think she would benefit from diabetic shoes to offload the lesions and prevent risk of ulceration, which she is a high risk for due to who be plantarflexion of the metatarsal and the hammertoe deformities that she has..  Referral sent to her pedorthist for this  Discussed the etiology and treatment options for the condition in detail with the patient. Educated patient on the topical and oral treatment options for mycotic nails. Recommended debridement of the nails today. Sharp and mechanical debridement performed of all  painful and mycotic nails today. Nails debrided in length and thickness using a nail nipper and a mechanical burr to level of comfort. Discussed treatment options including appropriate shoe gear. Follow up as needed for painful nails.  Discussed etiology of the porokeratosis and skin lesions in detail.  Recommended she treat this with urea cream.  I debrided the lesion with a chisel blade today and destroyed it with salinocaine ointment which was applied.  Return in about 3 months (around 08/30/2020) for at risk diabetic foot care.

## 2020-06-19 ENCOUNTER — Other Ambulatory Visit: Payer: Self-pay | Admitting: Family Medicine

## 2020-06-22 ENCOUNTER — Ambulatory Visit (INDEPENDENT_AMBULATORY_CARE_PROVIDER_SITE_OTHER): Payer: Medicare HMO

## 2020-06-22 ENCOUNTER — Other Ambulatory Visit: Payer: Self-pay

## 2020-06-22 DIAGNOSIS — E538 Deficiency of other specified B group vitamins: Secondary | ICD-10-CM

## 2020-06-22 MED ORDER — CYANOCOBALAMIN 1000 MCG/ML IJ SOLN
1000.0000 ug | Freq: Once | INTRAMUSCULAR | Status: AC
  Administered 2020-06-22: 1000 ug via INTRAMUSCULAR

## 2020-06-22 NOTE — Progress Notes (Signed)
Kathleen Gibson a 70 y.o.femalepresents to the office today for monthly b12injections, per physician's orders. Original order:every 4-week appointments if she wants to continue the B12 injections by nurse visit. Cyanocobalamin,1075mcg,IMwas administeredleft deltoidtoday. Patient tolerated injection.   [facility], CMA

## 2020-06-23 ENCOUNTER — Other Ambulatory Visit: Payer: Medicare HMO | Admitting: Orthotics

## 2020-07-06 ENCOUNTER — Encounter: Payer: Self-pay | Admitting: Family Medicine

## 2020-07-23 ENCOUNTER — Ambulatory Visit (INDEPENDENT_AMBULATORY_CARE_PROVIDER_SITE_OTHER): Payer: Medicare HMO

## 2020-07-23 ENCOUNTER — Other Ambulatory Visit: Payer: Self-pay

## 2020-07-23 DIAGNOSIS — E538 Deficiency of other specified B group vitamins: Secondary | ICD-10-CM

## 2020-07-23 MED ORDER — CYANOCOBALAMIN 1000 MCG/ML IJ SOLN
1000.0000 ug | Freq: Once | INTRAMUSCULAR | Status: AC
  Administered 2020-07-23: 1000 ug via INTRAMUSCULAR

## 2020-07-23 NOTE — Progress Notes (Signed)
Patient here for monthly b12 injection per Dr. Raoul Pitch.  Injection given in left deltoid and patient tolerated well.

## 2020-08-20 ENCOUNTER — Ambulatory Visit (INDEPENDENT_AMBULATORY_CARE_PROVIDER_SITE_OTHER): Payer: Medicare HMO

## 2020-08-20 ENCOUNTER — Other Ambulatory Visit: Payer: Self-pay

## 2020-08-20 DIAGNOSIS — E538 Deficiency of other specified B group vitamins: Secondary | ICD-10-CM

## 2020-08-20 MED ORDER — CYANOCOBALAMIN 1000 MCG/ML IJ SOLN
1000.0000 ug | Freq: Once | INTRAMUSCULAR | Status: AC
  Administered 2020-08-20: 1000 ug via INTRAMUSCULAR

## 2020-08-20 NOTE — Progress Notes (Signed)
Kathleen Gibson a 69 y.o.femalepresents to the office today for monthly b12injections, per physician's orders. Original order:every 4-week appointments if she wants to continue the B12 injections by nurse visit. Cyanocobalamin,1024mcg,IMwas administeredrightdeltoidtoday. Patient tolerated injection.   [facility], CMA

## 2020-09-01 ENCOUNTER — Ambulatory Visit: Payer: Medicare HMO | Admitting: Podiatry

## 2020-09-05 ENCOUNTER — Telehealth: Payer: Medicare HMO | Admitting: Nurse Practitioner

## 2020-09-05 DIAGNOSIS — J0100 Acute maxillary sinusitis, unspecified: Secondary | ICD-10-CM | POA: Diagnosis not present

## 2020-09-05 MED ORDER — AMOXICILLIN-POT CLAVULANATE 875-125 MG PO TABS: 1.0000 | ORAL_TABLET | Freq: Two times a day (BID) | ORAL | 0 refills | Status: DC

## 2020-09-05 NOTE — Progress Notes (Signed)
We are sorry that you are not feeling well.  Here is how we plan to help!  Based on what you have shared with me it looks like you have sinusitis.  Sinusitis is inflammation and infection in the sinus cavities of the head.  Based on your presentation I believe you most likely have Acute Bacterial Sinusitis.  This is an infection caused by bacteria and is treated with antibiotics. I have prescribed Augmentin 875mg/125mg one tablet twice daily with food, for 7 days. You may use an oral decongestant such as Mucinex D or if you have glaucoma or high blood pressure use plain Mucinex. Saline nasal spray help and can safely be used as often as needed for congestion.  If you develop worsening sinus pain, fever or notice severe headache and vision changes, or if symptoms are not better after completion of antibiotic, please schedule an appointment with a health care provider.    Sinus infections are not as easily transmitted as other respiratory infection, however we still recommend that you avoid close contact with loved ones, especially the very young and elderly.  Remember to wash your hands thoroughly throughout the day as this is the number one way to prevent the spread of infection!  Home Care:  Only take medications as instructed by your medical team.  Complete the entire course of an antibiotic.  Do not take these medications with alcohol.  A steam or ultrasonic humidifier can help congestion.  You can place a towel over your head and breathe in the steam from hot water coming from a faucet.  Avoid close contacts especially the very young and the elderly.  Cover your mouth when you cough or sneeze.  Always remember to wash your hands.  Get Help Right Away If:  You develop worsening fever or sinus pain.  You develop a severe head ache or visual changes.  Your symptoms persist after you have completed your treatment plan.  Make sure you  Understand these instructions.  Will watch your  condition.  Will get help right away if you are not doing well or get worse.  Your e-visit answers were reviewed by a board certified advanced clinical practitioner to complete your personal care plan.  Depending on the condition, your plan could have included both over the counter or prescription medications.  If there is a problem please reply  once you have received a response from your provider.  Your safety is important to us.  If you have drug allergies check your prescription carefully.    You can use MyChart to ask questions about today's visit, request a non-urgent call back, or ask for a work or school excuse for 24 hours related to this e-Visit. If it has been greater than 24 hours you will need to follow up with your provider, or enter a new e-Visit to address those concerns.  You will get an e-mail in the next two days asking about your experience.  I hope that your e-visit has been valuable and will speed your recovery. Thank you for using e-visits.   5-10 minutes spent reviewing and documenting in chart.  

## 2020-09-07 ENCOUNTER — Ambulatory Visit: Payer: Medicare HMO | Admitting: Family Medicine

## 2020-09-24 DIAGNOSIS — Z5320 Procedure and treatment not carried out because of patient's decision for unspecified reasons: Secondary | ICD-10-CM

## 2020-09-24 NOTE — Progress Notes (Signed)
[place] [facility])                                            [place] Team                                        Statin Quality Measure Assessment    09/24/2020  Kathleen Gibson 11/16/1950  Per review of chart and payor information, this patient has been flagged for non-adherence for Statin Use in Persons with Diabetes CMS Quality Measure. However, patient has previously declined statin initiation. Currently, patient does not have any appointments scheduled.     The 10-year ASCVD risk score Mikey Bussing DC Brooke Bonito., et al., 2013) is: ~19.5% (risk based on 11/07/2017 lipid panel)     Triglycerides:171 mg/dL     LDL Cholesterol: 70 mg/dL     HDL Cholesterol: 29.4 mg/dL     Total Cholesterol: 134 mg/dL   Please consider ONE of the following recommendations (pending lipid panel rechecked):   Initiate high intensity statin              Atorvastatin 40mg  once daily, #90, 3 refills  Rosuvastatin 20mg  once daily, #90, 3 refills  Initiate moderate intensity          statin with reduced frequency if prior          statin intolerance 1x weekly, #13, 3 refills  2x weekly, #26, 3 refills  3x weekly, #39, 3 refills   Thank you for your time,  Kristeen Miss, [place] Cell: [phone]

## 2020-10-05 ENCOUNTER — Telehealth: Payer: Self-pay

## 2020-10-05 ENCOUNTER — Ambulatory Visit: Payer: Medicare HMO | Admitting: Family Medicine

## 2020-10-05 MED ORDER — ZOLPIDEM TARTRATE 5 MG PO TABS: 5.0000 mg | ORAL_TABLET | Freq: Every day | ORAL | 0 refills | Status: DC

## 2020-10-05 NOTE — Telephone Encounter (Signed)
Patient refill request --- She is [place] visiting right now.  There is a refill, but [facility] will not [facility] prescription [facility], it has to be called / faxed directly from patient's PCP office.  zolpidem (AMBIEN) 5 MG tablet [[id number]]   [facility] [employer] [address] [place], [place] [address] [phone]

## 2020-10-05 NOTE — Telephone Encounter (Signed)
Printed script for her. Please fax to her pharmacy in [place].  Needs an appt prior to future refills.  Thanks

## 2020-10-05 NOTE — Telephone Encounter (Signed)
Pt is overdue for f/u. Rx cannot be transferred as it is a controlled. Please advise

## 2020-10-06 NOTE — Telephone Encounter (Signed)
Spoke with pt and informed her that rx will be faxed over today

## 2020-10-06 NOTE — Telephone Encounter (Signed)
Rx faxed

## 2020-10-07 NOTE — Telephone Encounter (Signed)
Patient called and stated that the prescription is wrong again  It needs to be electronically done and CANNOT be faxed

## 2020-10-08 NOTE — Telephone Encounter (Signed)
Patient has requested prescription to be sent electronically OR called in, the state of [place] will not accept a FAXed prescription on a controlled medication out of state.  Patient is upset this has been done wrong twice.  Please see prior messages regarding script.  Thank you.

## 2020-10-09 MED ORDER — ZOLPIDEM TARTRATE 5 MG PO TABS: 5.0000 mg | ORAL_TABLET | Freq: Every day | ORAL | 0 refills | Status: DC

## 2020-10-09 NOTE — Telephone Encounter (Signed)
Please call patient Please remind Kathleen Gibson that extending her a controlled substance prescription to a different pharmacy out of State is against our protocol and controlled substance agreement. It ids against the law for the pharmacy to transfer controlled substance scripts. I am willing to extend her script as a courtesy , but I ask her patience in the process since every State's protocol for receiving a controlled substance script is different. I received a message to FAX the script to her pharmacy and that is what we did- once.   I have now E-scribed one to her pharmacy in [place].

## 2020-10-09 NOTE — Addendum Note (Signed)
Addended by: Howard Pouch A on: 10/09/2020 07:40 AM   Modules accepted: Orders

## 2020-10-09 NOTE — Telephone Encounter (Signed)
Spoke with pt who was very understanding and appreciative of refill being sent. Pt states she will plan according in the future.

## 2020-10-21 DIAGNOSIS — Z9189 Other specified personal risk factors, not elsewhere classified: Secondary | ICD-10-CM

## 2020-10-21 DIAGNOSIS — Z5320 Procedure and treatment not carried out because of patient's decision for unspecified reasons: Secondary | ICD-10-CM

## 2020-10-21 NOTE — Progress Notes (Signed)
[place] [facility])                                            [place] Team                                        Statin Quality Measure Assessment    10/21/2020  Kathleen Gibson [birth year]  Next Appointment w/PCP: 10/28/2020  Per review of chart and payor information, this patient has been flagged for non-adherence for Statin Use in Persons with Diabetes CMS Quality Measure. However, patient has previously declined statin initiationbut has not been addressed this year.    The 10-year ASCVD risk score Mikey Bussing DC Brooke Bonito., et al., 2013) is: ~19.5% (risk based on 11/07/2017 lipid panel)     Triglycerides:171 mg/dL     LDL Cholesterol: 70 mg/dL     HDL Cholesterol: 29.4 mg/dL     Total Cholesterol: 134 mg/dL   Please consider ONE of the following recommendations (pending lipid panel being rechecked):    Initiate high intensity statin              Atorvastatin 40mg  once daily, #90, 3 refills  Rosuvastatin 20mg  once daily, #90, 3 refills   Initiate moderate intensity          statin with reduced frequency if prior          statin intolerance 1x weekly, #13, 3 refills  2x weekly, #26, 3 refills  3x weekly, #39, 3 refills   Thank you for your time,  Kristeen Miss, [place] Cell: [phone]

## 2020-10-28 ENCOUNTER — Observation Stay (HOSPITAL_COMMUNITY): Payer: Medicare HMO

## 2020-10-28 ENCOUNTER — Encounter (HOSPITAL_COMMUNITY): Payer: Self-pay | Admitting: *Deleted

## 2020-10-28 ENCOUNTER — Telehealth: Payer: Self-pay | Admitting: Family Medicine

## 2020-10-28 ENCOUNTER — Ambulatory Visit (INDEPENDENT_AMBULATORY_CARE_PROVIDER_SITE_OTHER): Payer: Medicare HMO | Admitting: Family Medicine

## 2020-10-28 ENCOUNTER — Other Ambulatory Visit: Payer: Self-pay

## 2020-10-28 ENCOUNTER — Inpatient Hospital Stay (HOSPITAL_COMMUNITY)
Admission: EM | Admit: 2020-10-28 | Discharge: 2020-11-20 | DRG: 803 | Disposition: A | Discharge status: Home / Self Care | Payer: Medicare HMO | Attending: Family Medicine | Admitting: Family Medicine

## 2020-10-28 ENCOUNTER — Encounter: Payer: Self-pay | Admitting: Family Medicine

## 2020-10-28 ENCOUNTER — Telehealth: Payer: Self-pay

## 2020-10-28 VITALS — BP 106/67 | HR 106 | Temp 98.2°F | Ht 63.0 in | Wt 164.0 lb

## 2020-10-28 DIAGNOSIS — Z5320 Procedure and treatment not carried out because of patient's decision for unspecified reasons: Secondary | ICD-10-CM

## 2020-10-28 DIAGNOSIS — E11649 Type 2 diabetes mellitus with hypoglycemia without coma: Secondary | ICD-10-CM | POA: Diagnosis not present

## 2020-10-28 DIAGNOSIS — R590 Localized enlarged lymph nodes: Secondary | ICD-10-CM

## 2020-10-28 DIAGNOSIS — E785 Hyperlipidemia, unspecified: Secondary | ICD-10-CM | POA: Diagnosis not present

## 2020-10-28 DIAGNOSIS — R599 Enlarged lymph nodes, unspecified: Secondary | ICD-10-CM | POA: Diagnosis not present

## 2020-10-28 DIAGNOSIS — E782 Mixed hyperlipidemia: Secondary | ICD-10-CM | POA: Diagnosis not present

## 2020-10-28 DIAGNOSIS — E119 Type 2 diabetes mellitus without complications: Secondary | ICD-10-CM | POA: Diagnosis not present

## 2020-10-28 DIAGNOSIS — T380X5A Adverse effect of glucocorticoids and synthetic analogues, initial encounter: Secondary | ICD-10-CM | POA: Diagnosis present

## 2020-10-28 DIAGNOSIS — E611 Iron deficiency: Secondary | ICD-10-CM | POA: Diagnosis not present

## 2020-10-28 DIAGNOSIS — E538 Deficiency of other specified B group vitamins: Secondary | ICD-10-CM | POA: Diagnosis not present

## 2020-10-28 DIAGNOSIS — K7460 Unspecified cirrhosis of liver: Secondary | ICD-10-CM

## 2020-10-28 DIAGNOSIS — D62 Acute posthemorrhagic anemia: Secondary | ICD-10-CM | POA: Diagnosis present

## 2020-10-28 DIAGNOSIS — I10 Essential (primary) hypertension: Secondary | ICD-10-CM | POA: Diagnosis present

## 2020-10-28 DIAGNOSIS — R000 Tachycardia, unspecified: Secondary | ICD-10-CM | POA: Diagnosis not present

## 2020-10-28 DIAGNOSIS — D510 Vitamin B12 deficiency anemia due to intrinsic factor deficiency: Secondary | ICD-10-CM | POA: Diagnosis present

## 2020-10-28 DIAGNOSIS — D649 Anemia, unspecified: Secondary | ICD-10-CM | POA: Diagnosis not present

## 2020-10-28 DIAGNOSIS — D72821 Monocytosis (symptomatic): Secondary | ICD-10-CM

## 2020-10-28 DIAGNOSIS — Z8249 Family history of ischemic heart disease and other diseases of the circulatory system: Secondary | ICD-10-CM

## 2020-10-28 DIAGNOSIS — Z79899 Other long term (current) drug therapy: Secondary | ICD-10-CM

## 2020-10-28 DIAGNOSIS — K8020 Calculus of gallbladder without cholecystitis without obstruction: Secondary | ICD-10-CM | POA: Diagnosis not present

## 2020-10-28 DIAGNOSIS — Z9884 Bariatric surgery status: Secondary | ICD-10-CM

## 2020-10-28 DIAGNOSIS — E1142 Type 2 diabetes mellitus with diabetic polyneuropathy: Secondary | ICD-10-CM | POA: Diagnosis not present

## 2020-10-28 DIAGNOSIS — Z833 Family history of diabetes mellitus: Secondary | ICD-10-CM

## 2020-10-28 DIAGNOSIS — L989 Disorder of the skin and subcutaneous tissue, unspecified: Secondary | ICD-10-CM

## 2020-10-28 DIAGNOSIS — R591 Generalized enlarged lymph nodes: Secondary | ICD-10-CM

## 2020-10-28 DIAGNOSIS — Z91199 Patient's noncompliance with other medical treatment and regimen due to unspecified reason: Secondary | ICD-10-CM

## 2020-10-28 DIAGNOSIS — D693 Immune thrombocytopenic purpura: Secondary | ICD-10-CM

## 2020-10-28 DIAGNOSIS — J189 Pneumonia, unspecified organism: Secondary | ICD-10-CM

## 2020-10-28 DIAGNOSIS — Z806 Family history of leukemia: Secondary | ICD-10-CM

## 2020-10-28 DIAGNOSIS — E1169 Type 2 diabetes mellitus with other specified complication: Secondary | ICD-10-CM | POA: Diagnosis not present

## 2020-10-28 DIAGNOSIS — G4700 Insomnia, unspecified: Secondary | ICD-10-CM

## 2020-10-28 DIAGNOSIS — Z6830 Body mass index (BMI) 30.0-30.9, adult: Secondary | ICD-10-CM

## 2020-10-28 DIAGNOSIS — R161 Splenomegaly, not elsewhere classified: Secondary | ICD-10-CM | POA: Diagnosis present

## 2020-10-28 DIAGNOSIS — K760 Fatty (change of) liver, not elsewhere classified: Secondary | ICD-10-CM | POA: Diagnosis present

## 2020-10-28 DIAGNOSIS — R42 Dizziness and giddiness: Secondary | ICD-10-CM | POA: Diagnosis not present

## 2020-10-28 DIAGNOSIS — Z66 Do not resuscitate: Secondary | ICD-10-CM | POA: Diagnosis present

## 2020-10-28 DIAGNOSIS — E669 Obesity, unspecified: Secondary | ICD-10-CM | POA: Diagnosis present

## 2020-10-28 DIAGNOSIS — Z886 Allergy status to analgesic agent status: Secondary | ICD-10-CM

## 2020-10-28 DIAGNOSIS — E1165 Type 2 diabetes mellitus with hyperglycemia: Secondary | ICD-10-CM | POA: Diagnosis present

## 2020-10-28 DIAGNOSIS — D696 Thrombocytopenia, unspecified: Secondary | ICD-10-CM

## 2020-10-28 DIAGNOSIS — D519 Vitamin B12 deficiency anemia, unspecified: Secondary | ICD-10-CM | POA: Diagnosis not present

## 2020-10-28 DIAGNOSIS — Z9119 Patient's noncompliance with other medical treatment and regimen: Secondary | ICD-10-CM | POA: Diagnosis not present

## 2020-10-28 DIAGNOSIS — Z20822 Contact with and (suspected) exposure to covid-19: Secondary | ICD-10-CM | POA: Diagnosis present

## 2020-10-28 LAB — CBC WITH DIFFERENTIAL/PLATELET
Abs Immature Granulocytes: 1.44 10*3/uL — ABNORMAL HIGH (ref 0.00–0.07)
Basophils Absolute: 0.1 10*3/uL (ref 0.0–0.1)
Basophils Absolute: 0 10*3/uL (ref 0.0–0.1)
Basophils Relative: 0.6 % (ref 0.0–3.0)
Basophils Relative: 0 %
Eosinophils Absolute: 0 10*3/uL (ref 0.0–0.7)
Eosinophils Absolute: 0 10*3/uL (ref 0.0–0.5)
Eosinophils Relative: 0.2 % (ref 0.0–5.0)
Eosinophils Relative: 0 %
HCT: 26.8 % — ABNORMAL LOW (ref 36.0–46.0)
HCT: 27.4 % — ABNORMAL LOW (ref 36.0–46.0)
Hemoglobin: 8.9 g/dL — ABNORMAL LOW (ref 12.0–15.0)
Hemoglobin: 8.3 g/dL — ABNORMAL LOW (ref 12.0–15.0)
Immature Granulocytes: 14 %
Lymphocytes Relative: 5.8 % — ABNORMAL LOW (ref 12.0–46.0)
Lymphocytes Relative: 9 %
Lymphs Abs: 0.5 10*3/uL — ABNORMAL LOW (ref 0.7–4.0)
Lymphs Abs: 0.9 10*3/uL (ref 0.7–4.0)
MCH: 28.4 pg (ref 26.0–34.0)
MCHC: 33.3 g/dL (ref 30.0–36.0)
MCHC: 30.3 g/dL (ref 30.0–36.0)
MCV: 86.8 fl (ref 78.0–100.0)
MCV: 93.8 fL (ref 80.0–100.0)
Monocytes Absolute: 1.8 10*3/uL — ABNORMAL HIGH (ref 0.1–1.0)
Monocytes Absolute: 2 10*3/uL — ABNORMAL HIGH (ref 0.1–1.0)
Monocytes Relative: 19.9 % — ABNORMAL HIGH (ref 3.0–12.0)
Monocytes Relative: 19 %
Neutro Abs: 6.7 10*3/uL (ref 1.4–7.7)
Neutro Abs: 6.1 10*3/uL (ref 1.7–7.7)
Neutrophils Relative %: 73.5 % (ref 43.0–77.0)
Neutrophils Relative %: 58 %
Platelets: 4 10*3/uL — CL (ref 150.0–400.0)
Platelets: <5 10*3/uL — CL (ref 150–400)
RBC: 3.09 Mil/uL — ABNORMAL LOW (ref 3.87–5.11)
RBC: 2.92 MIL/uL — ABNORMAL LOW (ref 3.87–5.11)
RDW: 19.8 % — ABNORMAL HIGH (ref 11.5–15.5)
RDW: 18.7 % — ABNORMAL HIGH (ref 11.5–15.5)
WBC: 9.2 10*3/uL (ref 4.0–10.5)
WBC: 10.5 10*3/uL (ref 4.0–10.5)
nRBC: 0.3 % — ABNORMAL HIGH (ref 0.0–0.2)

## 2020-10-28 LAB — COMPREHENSIVE METABOLIC PANEL
ALT: 7 U/L (ref 0–35)
ALT: 11 U/L (ref 0–44)
AST: 14 U/L (ref 0–37)
AST: 18 U/L (ref 15–41)
Albumin: 3.4 g/dL — ABNORMAL LOW (ref 3.5–5.2)
Albumin: 3.1 g/dL — ABNORMAL LOW (ref 3.5–5.0)
Alkaline Phosphatase: 48 U/L (ref 39–117)
Alkaline Phosphatase: 42 U/L (ref 38–126)
Anion gap: 6 (ref 5–15)
BUN: 16 mg/dL (ref 6–23)
BUN: 16 mg/dL (ref 8–23)
CO2: 27 mEq/L (ref 19–32)
CO2: 25 mmol/L (ref 22–32)
Calcium: 8.4 mg/dL (ref 8.4–10.5)
Calcium: 8.5 mg/dL — ABNORMAL LOW (ref 8.9–10.3)
Chloride: 103 mEq/L (ref 96–112)
Chloride: 104 mmol/L (ref 98–111)
Creatinine, Ser: 0.77 mg/dL (ref 0.40–1.20)
Creatinine, Ser: 0.87 mg/dL (ref 0.44–1.00)
GFR, Estimated: >60 mL/min (ref >60)
GFR: 78.85 mL/min (ref >60.00)
Glucose, Bld: 167 mg/dL — ABNORMAL HIGH (ref 70–99)
Glucose, Bld: 199 mg/dL — ABNORMAL HIGH (ref 70–99)
Potassium: 3.8 mEq/L (ref 3.5–5.1)
Potassium: 3.8 mmol/L (ref 3.5–5.1)
Sodium: 136 mEq/L (ref 135–145)
Sodium: 135 mmol/L (ref 135–145)
Total Bilirubin: 0.8 mg/dL (ref 0.2–1.2)
Total Bilirubin: 0.9 mg/dL (ref 0.3–1.2)
Total Protein: 8.2 g/dL (ref 6.0–8.3)
Total Protein: 8.6 g/dL — ABNORMAL HIGH (ref 6.5–8.1)

## 2020-10-28 LAB — PROTIME-INR
INR: 1.2 (ref 0.8–1.2)
Prothrombin Time: 15.4 seconds — ABNORMAL HIGH (ref 11.4–15.2)

## 2020-10-28 LAB — RESP PANEL BY RT-PCR (FLU A&B, COVID) ARPGX2
Influenza A by PCR: NEGATIVE
Influenza B by PCR: NEGATIVE
SARS Coronavirus 2 by RT PCR: NEGATIVE

## 2020-10-28 LAB — ABO/RH: ABO/RH(D): O POS

## 2020-10-28 LAB — C-REACTIVE PROTEIN: CRP: <1 mg/dL (ref 0.5–20.0)

## 2020-10-28 LAB — POCT GLYCOSYLATED HEMOGLOBIN (HGB A1C)
HbA1c POC (<> result, manual entry): 5.7 % (ref 4.0–5.6)
HbA1c, POC (controlled diabetic range): 5.7 % (ref 0.0–7.0)
HbA1c, POC (prediabetic range): 5.7 % (ref 5.7–6.4)
Hemoglobin A1C: 5.7 % — AB (ref 4.0–5.6)

## 2020-10-28 LAB — SEDIMENTATION RATE: Sed Rate: 51 mm/hr — ABNORMAL HIGH (ref 0–30)

## 2020-10-28 LAB — VITAMIN D 25 HYDROXY (VIT D DEFICIENCY, FRACTURES): VITD: 22.1 ng/mL — ABNORMAL LOW (ref 30.00–100.00)

## 2020-10-28 LAB — TSH: TSH: 3.17 u[IU]/mL (ref 0.35–4.50)

## 2020-10-28 MED ORDER — B-12 1000 MCG SL SUBL: 1.0000 | SUBLINGUAL_TABLET | Freq: Every day | SUBLINGUAL | 3 refills | Status: DC

## 2020-10-28 MED ORDER — ACETAMINOPHEN 650 MG RE SUPP: 650.0000 mg | Freq: Four times a day (QID) | RECTAL | Status: DC | PRN

## 2020-10-28 MED ORDER — ONDANSETRON HCL 4 MG PO TABS: 4.0000 mg | ORAL_TABLET | Freq: Four times a day (QID) | ORAL | Status: DC | PRN

## 2020-10-28 MED ORDER — SODIUM CHLORIDE 0.9 % IV BOLUS
1000.0000 mL | Freq: Once | INTRAVENOUS | Status: AC
  Administered 2020-10-28: 1000 mL via INTRAVENOUS

## 2020-10-28 MED ORDER — MELATONIN 5 MG PO TABS
10.0000 mg | ORAL_TABLET | Freq: Every evening | ORAL | Status: DC | PRN
  Administered 2020-10-30 – 2020-11-10 (×3): 10 mg via ORAL
  Filled 2020-10-28 (×4): qty 2

## 2020-10-28 MED ORDER — IMMUNE GLOBULIN (HUMAN) 10 GM/100ML IV SOLN
1.0000 g/kg | INTRAVENOUS | Status: AC
  Administered 2020-10-29 – 2020-10-30 (×2): 75 g via INTRAVENOUS
  Filled 2020-10-28: qty 750
  Filled 2020-10-28: qty 600
  Filled 2020-10-28: qty 750
  Filled 2020-10-28: qty 50
  Filled 2020-10-28: qty 750

## 2020-10-28 MED ORDER — INSULIN ASPART 100 UNIT/ML IJ SOLN
0.0000 [IU] | Freq: Three times a day (TID) | INTRAMUSCULAR | Status: DC
  Administered 2020-10-29: 2 [IU] via SUBCUTANEOUS
  Administered 2020-10-30: 11 [IU] via SUBCUTANEOUS
  Administered 2020-10-30: 2 [IU] via SUBCUTANEOUS
  Administered 2020-10-30: 5 [IU] via SUBCUTANEOUS
  Administered 2020-10-31: 11 [IU] via SUBCUTANEOUS
  Administered 2020-10-31 (×2): 8 [IU] via SUBCUTANEOUS
  Administered 2020-11-01: 11 [IU] via SUBCUTANEOUS
  Administered 2020-11-01 (×2): 5 [IU] via SUBCUTANEOUS
  Administered 2020-11-02: 2 [IU] via SUBCUTANEOUS

## 2020-10-28 MED ORDER — ONDANSETRON HCL 4 MG/2ML IJ SOLN
4.0000 mg | Freq: Once | INTRAMUSCULAR | Status: AC
  Administered 2020-10-29: 4 mg via INTRAVENOUS
  Filled 2020-10-28: qty 2

## 2020-10-28 MED ORDER — FERROUS SULFATE 325 (65 FE) MG PO TABS: 325.0000 mg | ORAL_TABLET | Freq: Every day | ORAL | 3 refills | Status: DC

## 2020-10-28 MED ORDER — ACETAMINOPHEN 325 MG PO TABS
650.0000 mg | ORAL_TABLET | Freq: Once | ORAL | Status: AC
  Administered 2020-10-29: 650 mg via ORAL
  Filled 2020-10-28: qty 2

## 2020-10-28 MED ORDER — DIPHENHYDRAMINE HCL 25 MG PO CAPS
25.0000 mg | ORAL_CAPSULE | Freq: Once | ORAL | Status: AC
  Administered 2020-10-29: 25 mg via ORAL
  Filled 2020-10-28: qty 1

## 2020-10-28 MED ORDER — METOPROLOL SUCCINATE ER 50 MG PO TB24: 50.0000 mg | ORAL_TABLET | Freq: Every day | ORAL | 1 refills | Status: DC

## 2020-10-28 MED ORDER — CYANOCOBALAMIN 1000 MCG/ML IJ SOLN
1000.0000 ug | Freq: Every day | INTRAMUSCULAR | Status: DC
  Filled 2020-10-28: qty 1

## 2020-10-28 MED ORDER — POLYETHYLENE GLYCOL 3350 17 G PO PACK: 17.0000 g | PACK | Freq: Every day | ORAL | Status: DC | PRN

## 2020-10-28 MED ORDER — METOPROLOL SUCCINATE ER 50 MG PO TB24
50.0000 mg | ORAL_TABLET | Freq: Every day | ORAL | Status: DC
  Administered 2020-10-29 – 2020-11-10 (×13): 50 mg via ORAL
  Filled 2020-10-28 (×14): qty 1

## 2020-10-28 MED ORDER — ONDANSETRON HCL 4 MG/2ML IJ SOLN: 4.0000 mg | Freq: Four times a day (QID) | INTRAMUSCULAR | Status: DC | PRN

## 2020-10-28 MED ORDER — GABAPENTIN 400 MG PO CAPS
800.0000 mg | ORAL_CAPSULE | Freq: Three times a day (TID) | ORAL | Status: DC
  Administered 2020-10-29 – 2020-11-10 (×40): 800 mg via ORAL
  Filled 2020-10-28 (×42): qty 2

## 2020-10-28 MED ORDER — CYANOCOBALAMIN 1000 MCG/ML IJ SOLN
1000.0000 ug | Freq: Once | INTRAMUSCULAR | Status: AC
  Administered 2020-10-28: 1000 ug via INTRAMUSCULAR

## 2020-10-28 MED ORDER — SITAGLIPTIN PHOS-METFORMIN HCL 50-1000 MG PO TABS: 1.0000 | ORAL_TABLET | Freq: Every day | ORAL | 1 refills | Status: DC

## 2020-10-28 MED ORDER — MIRTAZAPINE 15 MG PO TBDP: 15.0000 mg | ORAL_TABLET | Freq: Every day | ORAL | 1 refills | Status: DC

## 2020-10-28 MED ORDER — MIRTAZAPINE 15 MG PO TBDP
15.0000 mg | ORAL_TABLET | Freq: Every day | ORAL | Status: DC
  Administered 2020-10-29 – 2020-11-10 (×14): 15 mg via ORAL
  Filled 2020-10-28 (×15): qty 1

## 2020-10-28 MED ORDER — ACETAMINOPHEN 325 MG PO TABS
650.0000 mg | ORAL_TABLET | Freq: Four times a day (QID) | ORAL | Status: DC | PRN
  Administered 2020-10-30 – 2020-11-03 (×5): 650 mg via ORAL
  Filled 2020-10-28 (×8): qty 2

## 2020-10-28 MED ORDER — GABAPENTIN 300 MG PO CAPS: ORAL_CAPSULE | ORAL | 1 refills | Status: DC

## 2020-10-28 MED ORDER — SODIUM CHLORIDE 0.9 % IV SOLN
10.0000 mL/h | Freq: Once | INTRAVENOUS | Status: AC
  Administered 2020-10-29: 10 mL/h via INTRAVENOUS

## 2020-10-28 NOTE — Patient Instructions (Addendum)
Stop hydrochlorothiazide.  Stop glyburide.  Hydrate. Eat at least 3 small meals a day- even not hungry. Try Ensure drinks to help with calories.  Start remeron 1/2 tab before bed for 7 days, if need may increase to 1 tab nightly. Goal is to stop [place].  Follow up in 5-6 weeks on new med start and if needed we can increase again.   5.5 months follow up for chronic conditions.    I have referred you to dermatology and back to your oncologist.   I have also ordered a CT of your abdomen.   Please complete advanced care directive packet and MOST form> once completed drop back off to [place].    Great to see you today.  I have refilled the medication(s) we provide.   If labs were collected, we will inform you of lab results once received either by echart message or telephone call.   - echart message- for normal results that have been seen by the patient already.   - telephone call: abnormal results or if patient has not viewed results in their echart.

## 2020-10-28 NOTE — Telephone Encounter (Signed)
Please inform pt her platelet count is 4.  This places her a extremely high risk for bleeding.   If she is still taking fish oil- STOP If she is still taking the vit d capsules (not tabs) STOP Absolutely avoid ALL NSAIDS.  If she notes bleeding  She was dizzy and had elevated HR because she has low blood count from bleeding.   I had placed an urgent referral to heme/onc for her at her visit. However, these findings are consider an emergency in the setting of [place] suspecting she has a malignancy and she should report the Emergency room immediately. She will need transfusion and urgent work up.

## 2020-10-28 NOTE — Telephone Encounter (Signed)
Spoke with pt regarding labs and instructions. Pt states she will go to a [facility] hos does not knkow which one at this time

## 2020-10-28 NOTE — H&P (Signed)
History and Physical    Kathleen Gibson WFU:[id number] DOB: 1951-01-02 DOA: 10/28/2020  PCP: [name], DO  Patient coming from: Home   Chief Complaint:  Chief Complaint  Patient presents with  . Abnormal Lab     HPI:    70 year old female with past medical history of diabetes mellitus type 2, diabetic polyneuropathy, ITP (diagnosed 2014, follows with Dr. Burr Medico), medical noncompliance, gastric bypass surgery (2000), lipidemia, hypertension, pernicious anemia/vitamin B12 deficiency, nonalcoholic fatty liver disease who presents to [facility] emergency department the direction of her primary care provider due to severe thrombocytopenia.  Patient explains that approximately 8 weeks ago she and her family all got "sick."  Patient states that she took a home COVID test and this was negative.  While respite family recovered in the days that followed she continued to feel ill.  Patient complains of generalized malaise weakness and poor appetite.  Patient complains of ongoing myalgias.  Patient began to develop several nosebleeds which she was able to control with conservative measures.  Patient symptoms continue to persist over the following weeks.  Patient spoke to a telemedicine provider who prescribed her a course of antibiotics.  Patient is unable to recall the name of the antibiotic but despite taking this antibiotic felt no better.  In the weeks that followed, patient began to gradually lose weight and approximately using at least 12 pounds over the span of time.  Upon further questioning patient denies chest pain, cough, dysuria, abdominal pain, recent travel, diarrhea.  Patient eventually went to go see her primary care provider and upon undergoing routine lab work was told to immediately go to the emergency department due to severe thrombocytopenia with platelet count of 4.  Upon evaluation in the emergency department, platelet count was confirmed to be less than 5.  1 pack of  platelets was ordered for transfusion by the emergency department provider.  Case was discussed with Dr. Irene Limbo with hematology/oncology who recommended initiation of IVIG as well as initiation of prednisone therapy as well as initiation of vitamin B12 supplementation.  The hospitalist group was then called to assess the patient for admission to the hospital.  Review of Systems:   ROS  Past Medical History:  Diagnosis Date  . Diabetes ([place])   . HLD (hyperlipidemia)   . Hypertension   . Pernicious anemia   . Pneumonia 09/05/2019  . Renal insufficiency 09/06/2019  . Vitamin D deficiency     Past Surgical History:  Procedure Laterality Date  . ABDOMINAL HERNIA REPAIR  2009  . ABDOMINOPLASTY    . [place]  . GASTRIC BYPASS  2000  . hemorroid surgery  2007  . TONSILLECTOMY AND ADENOIDECTOMY  1957     reports that she has never smoked. She has never used smokeless tobacco. She reports that she does not drink alcohol and does not use drugs.  Allergies  Allergen Reactions  . Asa [Aspirin] Other (See Comments)    Contraindicated d/t low plts  . Nsaids Other (See Comments)    Contraindicated d/t low plts    Family History  Problem Relation Age of Onset  . Cancer Mother 73       unknown type cancer   . Hypertension Mother   . Hypertension Father   . Heart failure Father   . Pneumonia Father   . Diabetes Sister   . Leukemia Brother   . Colon cancer Brother 23  . Heart Problems Brother   . Leukemia  Other   . Diabetes Sister   . Heart Problems Sister   . Cancer Brother   . Heart Problems Brother      Prior to Admission medications   Medication Sig Start Date End Date Taking? Authorizing Provider  Cyanocobalamin (B-12) 1000 MCG SUBL Place 1 tablet under the tongue daily. 10/28/20   Kuneff, Renee A, DO  ferrous sulfate 325 (65 FE) MG tablet Take 1 tablet (325 mg total) by mouth daily with breakfast. 10/28/20   Kuneff, Renee A, DO  gabapentin (NEURONTIN) 300 MG  capsule 900 my QHS and 300 mg every 8 hrs in day 10/28/20   Kuneff, Renee A, DO  glucose monitoring kit (FREESTYLE) monitoring kit 1 each by Does not apply route as needed for other. Use as directed 02/24/14   Lucille Passy, MD  metoprolol succinate (TOPROL-XL) 50 MG 24 hr tablet Take 1 tablet (50 mg total) by mouth daily. 10/28/20   Kuneff, Renee A, DO  mirtazapine (REMERON SOL-TAB) 15 MG disintegrating tablet Take 1 tablet (15 mg total) by mouth at bedtime. 10/28/20   Kuneff, Renee A, DO  Omega-3 Fatty Acids (FISH OIL) 1000 MG CAPS Take 1,000 mg by mouth daily.     [provider]  sitaGLIPtin-metformin (JANUMET) 50-1000 MG tablet Take 1 tablet by mouth at bedtime. 10/28/20   Kuneff, Renee A, DO  Vitamin D, Cholecalciferol, 25 MCG (1000 UT) CAPS Take 1,000 Units by mouth daily.     [provider]  zolpidem (AMBIEN) 5 MG tablet Take 1 tablet (5 mg total) by mouth at bedtime. 10/09/20   [name], DO    Physical Exam: Vitals:   10/28/20 2145 10/28/20 2200 10/28/20 2215 10/28/20 2230  BP: 118/66 114/66 (!) 118/59 (!) 102/53  Pulse: 99 99 98 99  Resp: (!) 25 (!) 24 (!) 23 (!) 23  Temp:      TempSrc:      SpO2: 99% 99% 100% 99%    Constitutional: Awake alert and oriented x3, no associated distress.   Skin: Good skin turgor.  Notable small ecchymoses noted over the bilateral upper extremities.  Notable petechiae noted sporadically over all extremities and torso. Eyes: Pupils are equally reactive to light.  No evidence of scleral icterus or conjunctival pallor.  ENMT: Moist mucous membranes noted.  Posterior pharynx clear of any exudate or lesions.   Neck: normal, supple, no masses, no thyromegaly.  No evidence of jugular venous distension.   Respiratory: clear to auscultation bilaterally, no wheezing, no crackles. Normal respiratory effort. No accessory muscle use.  Cardiovascular: Regular rate and rhythm, no murmurs / rubs / gallops. No extremity edema. 2+ pedal pulses. No  carotid bruits.  Chest:   Nontender without crepitus or deformity.   Back:   Nontender without crepitus or deformity. Abdomen: Vague anterior abdominal wall fullness in the suprapubic region.  Abdomen is soft and nontender.    Positive bowel sounds noted in all quadrants.   Musculoskeletal: No joint deformity upper and lower extremities. Good ROM, no contractures. Normal muscle tone.  Neurologic: CN 2-12 grossly intact. Sensation intact.  Patient moving all 4 extremities spontaneously.  Patient is following all commands.  Patient is responsive to verbal stimuli.   Psychiatric: Patient exhibits normal mood with appropriate affect.  Patient seems to possess insight as to their current situation.     Labs on Admission: I have personally reviewed following labs and imaging studies -   CBC: Recent Labs  Lab 10/28/20 0952 10/28/20  1806  WBC 9.2 10.5  NEUTROABS 6.7 6.1  HGB 8.9 Repeated and verified X2.* 8.3*  HCT 26.8* 27.4*  MCV 86.8 93.8  PLT 4.0 Repeated and verified X2.* <5*   Basic Metabolic Panel: Recent Labs  Lab 10/28/20 0952 10/28/20 1806  NA 136 135  K 3.8 3.8  CL 103 104  CO2 27 25  GLUCOSE 167* 199*  BUN 16 16  CREATININE 0.77 0.87  CALCIUM 8.4 8.5*   GFR: Estimated Creatinine Clearance: 59 mL/min (by C-G formula based on SCr of 0.87 mg/dL). Liver Function Tests: Recent Labs  Lab 10/28/20 0952 10/28/20 1806  AST 14 18  ALT 7 11  ALKPHOS 48 42  BILITOT 0.8 0.9  PROT 8.2 8.6*  ALBUMIN 3.4* 3.1*   No results for input(s): LIPASE, AMYLASE in the last 168 hours. No results for input(s): AMMONIA in the last 168 hours. Coagulation Profile: Recent Labs  Lab 10/28/20 1806  INR 1.2   Cardiac Enzymes: No results for input(s): CKTOTAL, CKMB, CKMBINDEX, TROPONINI in the last 168 hours. BNP (last 3 results) No results for input(s): PROBNP in the last 8760 hours. HbA1C: Recent Labs    10/28/20 0914  HGBA1C 5.7  5.7  5.7  5.7*   CBG: No results for  input(s): GLUCAP in the last 168 hours. Lipid Profile: No results for input(s): CHOL, HDL, LDLCALC, TRIG, CHOLHDL, LDLDIRECT in the last 72 hours. Thyroid Function Tests: Recent Labs    10/28/20 0952  TSH 3.17   Anemia Panel: No results for input(s): VITAMINB12, FOLATE, FERRITIN, TIBC, IRON, RETICCTPCT in the last 72 hours. Urine analysis:    Component Value Date/Time   COLORURINE YELLOW 09/05/2019 2240   APPEARANCEUR CLEAR 09/05/2019 2240   LABSPEC 1.010 09/05/2019 2240   PHURINE 5.5 09/05/2019 2240   GLUCOSEU NEGATIVE 09/05/2019 2240   HGBUR MODERATE (A) 09/05/2019 2240   BILIRUBINUR NEGATIVE 09/05/2019 2240   KETONESUR NEGATIVE 09/05/2019 2240   PROTEINUR 30 (A) 09/05/2019 2240   NITRITE NEGATIVE 09/05/2019 2240   LEUKOCYTESUR NEGATIVE 09/05/2019 2240    Radiological Exams on Admission - Personally Reviewed: US Abdomen Limited RUQ (LIVER/GB)  Result Date: 10/28/2020 CLINICAL DATA:  Follow-up cirrhosis EXAM: ULTRASOUND ABDOMEN LIMITED RIGHT UPPER QUADRANT COMPARISON:  09/05/2019 FINDINGS: Gallbladder: Gallbladder is well distended with gallbladder sludge within. Previously seen calculi are noted within the dependent sludge. No wall thickening or pericholecystic fluid is noted. Negative sonographic Murphy's sign is elicited. Common bile duct: Diameter: 2.3 mm. Liver: No focal lesion identified. Within normal limits in parenchymal echogenicity. Portal vein is patent on color Doppler imaging with normal direction of blood flow towards the liver. Other: None. IMPRESSION: Gallbladder sludge and cholelithiasis. No complicating factors are noted. Electronically Signed   By: Inez Catalina M.D.   On: 10/28/2020 23:34    EKG: Personally reviewed.  Rhythm is sinus tachycardia with heart rate of 117 bpm.  No dynamic ST segment changes appreciated.  Assessment/Plan Principal Problem:   Acute on chronic ITP [facility])   Patient is a longstanding history of ITP since 2014 and has only had  sporadic follow-up with hematology  Per review of outpatient notes, patient was recommended to undergo bone marrow biopsy in 11/2017 due to progressive disease but failed to follow-up.  Patient presenting with profound thrombocytopenia without evidence of significant bleeding with exception of several small ecchymoses and evidence of petechia on exam  Acute on chronic ITP may be multifactorial with superimposed recent viral illness as well as possible progression of patient's  nonalcoholic fatty liver disease superimposed on already known history of chronic ITP  Obtaining immature platelet fraction  Transfusing pack of platelets already ordered by emergency department.  Target for transfusion is either 10K or patient will be transfused if there is evidence of active bleeding  Additionally monitoring hemoglobin and hematocrit closely and will transfuse if hemoglobin drops below 7.5 per hematology recommendations  Additionally treating patient with course of prednisone 60 mg daily as well as 2-day course of IVIG per hematology recommendations  Monitoring closely with serial CBCs  Performing work-up to evaluate for any evidence of underlying infection  Obtaining right upper quadrant ultrasound to evaluate for any development of cirrhosis secondary to known nonalcoholic fatty liver disease.  Active Problems:   Type 2 diabetes mellitus with diabetic polyneuropathy, without long-term current use of insulin ([place])  . Patient been placed on Accu-Cheks before every meal and nightly with sliding scale insulin . Holding home regimen of hypoglycemics . Hemoglobin A1C ordered . Diabetic Diet    Diabetic polyneuropathy associated with type 2 diabetes mellitus ([place])   Continue home regimen of gabapentin    Essential hypertension   Continue home regimen of metoprolol    B12 deficiency   5-day course of intramuscular vitamin B12 per hematology recommendations    History of noncompliance with  medical treatment   Patient has longstanding history of intermittent follow-up with providers and intermittent compliance with treatment.    Abdominal lymphadenopathy  Patient identified to have evidence of abdominal lymphadenopathy in March 2021 concerning for underlying malignant process  Up to this point has declined any more work-up by her outpatient providers  On exam today there is vague abdominal fullness palpated.  Patient may benefit from further imaging with contrast however will await opinion of hematology/oncology    Anemia   Slight drop in hemoglobin compared to several months prior  No obvious evidence of significant bleeding on exam  Obtain vitamin B12 and folate as well as iron panel  We will transfuse if hemoglobin drops below 7.5  I do not believe there is any need to consult gastroenterology at this point.   Code Status:  DNR Family Communication: deferred   Status is: Observation  The patient remains OBS appropriate and will d/c before 2 midnights.  Dispo: The patient is from: Home              Anticipated d/c is to: Home              Patient currently is not medically stable to d/c.   Difficult to place patient No        Vernelle Emerald MD [facility] Pager [phone]  If 7PM-7AM, please contact night-coverage [email or web address] Use universal  password for that web site. If you do not have the password, please call the hospital operator.  10/28/2020, 11:36 PM

## 2020-10-28 NOTE — Progress Notes (Signed)
Hematology Short Note  Called about Dr Ernestina Penna patient in the ED> last see in 11/2017 for thrombocytopenia throught to be Chronic ITP + splenomegaly due to fatty liver + pernicious anemia  Now admitted with Acute on Chronic severe thrombocytopenia with plt<5K and  normocytic anemia ( normal bilirubin). No rena insufficiency. PLAN -admit to hospitalist service -if patient agreeable-- -plt transfusion for platelets <10k and plz get post transfusion platelet count in 30-60 mins Transfuse PRBC for hgb<7.5 or if symptomatic after each platelet transfusion -immature platelet fraction -platelets in citrate to r/o platelet clumping -labs ordered for further evaluation. -B12 1000 mcg [place] daily x 5 days -B complex 1 cap po daily -Would treat as acute on chronic ITP . Given diabetes will hold off on high dose Dexamethasone induction and instead recommend Prednisone 60 mg po daily along with  mx of diabetes per hospitalist -Would recommend starting on IVIG 1g/kg q24h x 2 doses wth tylenol,zofran 4mg  and benadryl 25mg  as premedications -would recommendUS abd to evaluate for liver pathology and interval change in splenomegaly. IF splenomegaly increased might need outpatient PET/CT for further evaluation. -given h/o pernicious anemia check and replace Iron and B12 aggressively for anemia. - hematology will see in AM.  Sullivan Lone MD

## 2020-10-28 NOTE — ED Triage Notes (Signed)
Pt sent here from pcp due to low platelet level. Reports fatigue and mild sob. No acute distress is noted at triage. Reports hx of low platelets, is unsure if she ever had blood transfusion. Reports recent increase in bruising and nosebleeds.

## 2020-10-28 NOTE — Telephone Encounter (Signed)
Received a critical value call from Santiago Glad at 1644 10/28/20 stating pt PLT count is 4 K/uL verified twice and reviewed under microscope and saw  < 20. Message being sent to Dr. Raoul Pitch, PCP.   Please advise.

## 2020-10-28 NOTE — Telephone Encounter (Addendum)
addressed by urgent phone note

## 2020-10-28 NOTE — Progress Notes (Signed)
Patient ID: Kathleen Gibson, female  DOB: [birth year], 70 y.o.   MRN: [id number] Patient Care Team    Relationship Specialty Notifications Start End  [name], DO PCP - General Family Medicine  11/08/17   Doran Stabler, MD Consulting Physician Gastroenterology  04/06/17   Truitt Merle, MD Consulting Physician Hematology  04/06/17   Alda Berthold, [place] Physician Neurology  01/16/19     Chief Complaint  Patient presents with  . Follow-up    [facility];   . Diabetes    Pt is not fasting     Subjective:  Kathleen Gibson is a 70 y.o.  female present for [facility] follow up.  Diabetes: Pt reports compliance with janumet.  Glyburide was discontinued a few weeks ago secondary to concern for low sugars due to dizziness. Patient denies dizziness, hyperglycemic or hypoglycemic events. Patient denies numbness, tingling in the extremities or nonhealing wounds of feet.  She is having difficulty trimming her own toenails. She reports bruising frequently after- she also has ITP with plts last about 48 PNA: completed  Flu shot: Patient declined (recommneded yearly) Foot exam: completed 05/2020 Eye exam: 8/3/22020 completed- vision works at [employer]. Encouraged her to schedule.   Hypertension/hypertriglyceridemia Pt reports compliance with toprol xl 50 mg qd and HCTZ 12.5 mg QD.Patient denies chest pain, shortness of breath or lower extremity edema.  Pt does not take a daily baby ASA (low platelets). Pt is not prescribed statin. RF: Hypertension, diabetes, hyperlipidemia, obesity  INSOMNIA, CHRONIC/RLS Ambien 5 mg working well. Gabapentin-600- 900 mg QHS is working ok for RLS.  Patient reports she is having more difficulty with eating.  She states she just does not have an appetite anymore.  She has lost 30 pounds unintentionally over the last year.    Chronic ITP (idiopathic thrombocytopenia) (HCC)/lymphadenopathy/unintentional weight loss Last labs Plt 61>48> collected today  Labs  11/07/2017 plts dropped to 34.  Since that time they have stabilized averaging around 48-80.  She saw her heme in June 2020. Per their note she would be managed clinically as long as remained above 30K. Otherwise she would need referred to surgery for splenectomy.  She denies increased bleeding, but endorses bruising more frequently, especially on her arms.   During her hospital admission spring 2021, she had a CT which incidentally noted worsening lymphadenopathy of her abdomen and pelvis. CT:09/05/2019:. Interval worsening adenopathy over the upper abdomen, mesentery, periaortic region and iliac chains as described. Adenopathy is worse over the region of the celiac axis with lymph nodes measuring up to 1.8 cm by short axis. Findings are concerning for a neoplastic Process.  Lengthy conversation was held at that time with patient and she elected not to pursue any work-up.  She understood the diagnosis was concerning for cancer.  She stated "I just do not want to know.  " Today she states that she knows she is behind on her follow-ups on her blood cell counts.  She has been in [place] for the last couple months to visit her family.  She reports she had an enjoyable time in [place] but feels extremely fatigued.  She states she just feels very weak.  She has lost 30 pounds over the last year unintentionally.  She has continued her iron supplement twice daily.  She has experienced some dizziness the last few weeks while in [place].  b12 deficiency/pernicious anemia:   She is approved for B12 q 4 weeks .  She frequently skips many months between.  She has not started the recommended sublingual B12. she states since her ED visit 2020, her legs and arms have continue to cause discomfort.  She has been evaluated by neurology January 21, 2019.  It was felt by the neurology team she was experiencing diabetic peripheral neuropathy involving a stocking glove distribution.  SHe is found to have distal predominant  large fiber peripheral neuropathy on their exam.  Neurology felt follow-up only if patient's symptoms became worse, and at that time they would recommend EMG/nerve conduction testing  Skin lesion: Patient reports she has had a skin lesion on her right temple area for about 6 to 8 months she has noticed it growing in size, scab over, but just will not heal.   Depression screen [facility] 2/9 10/28/2020 05/22/2020 04/15/2019 04/11/2018 10/25/2017  Decreased Interest 1 0 0 0 0  Down, Depressed, Hopeless 1 0 0 - 0  PHQ - 2 Score 2 0 0 0 0  Altered sleeping 0 - - - -  Tired, decreased energy 3 - - - -  Change in appetite 2 - - - -  Feeling bad or failure about yourself  1 - - - -  Trouble concentrating 2 - - - -  Moving slowly or fidgety/restless 1 - - - -  Suicidal thoughts 0 - - - -  PHQ-9 Score 11 - - - -   GAD 7 : Generalized Anxiety Score 10/28/2020  Nervous, Anxious, on Edge 1  Control/stop worrying 0  Worry too much - different things 0  Trouble relaxing 2  Restless 1  Easily annoyed or irritable 1  Afraid - awful might happen 0  Total GAD 7 Score 5       Fall Risk  05/22/2020 04/15/2019 01/16/2019 04/11/2018 04/06/2017  Falls in the past year? 0 0 1 No No  Number falls in past yr: 0 0 0 - -  Injury with Fall? 0 0 0 - -  Follow up Falls evaluation completed Falls evaluation completed - - -    Immunization History  Administered Date(s) Administered  . PFIZER(Purple Top)SARS-COV-2 Vaccination 07/05/2019, 07/26/2019, 06/22/2020  . Pneumococcal Conjugate-13 03/19/2015  . Pneumococcal Polysaccharide-23 06/03/2008, 07/11/2017  . Td 06/13/2006    No exam data present  Past Medical History:  Diagnosis Date  . Diabetes ([place])   . HLD (hyperlipidemia)   . Hypertension   . Pernicious anemia   . Pneumonia 09/05/2019  . Renal insufficiency 09/06/2019  . Vitamin D deficiency    Allergies  Allergen Reactions  . Asa [Aspirin] Other (See Comments)    Contraindicated d/t low plts  .  Nsaids Other (See Comments)    Contraindicated d/t low plts   Past Surgical History:  Procedure Laterality Date  . ABDOMINAL HERNIA REPAIR  2009  . ABDOMINOPLASTY    . [place]  . GASTRIC BYPASS  2000  . hemorroid surgery  2007  . TONSILLECTOMY AND ADENOIDECTOMY  1957   Family History  Problem Relation Age of Onset  . Cancer Mother 39       unknown type cancer   . Hypertension Mother   . Hypertension Father   . Heart failure Father   . Pneumonia Father   . Diabetes Sister   . Leukemia Brother   . Colon cancer Brother 41  . Heart Problems Brother   . Leukemia Other   . Diabetes Sister   . Heart Problems Sister   . Cancer  Brother   . Heart Problems Brother    Social History   Socioeconomic History  . Marital status: Divorced    Spouse name: Not on file  . Number of children: 3  . Years of education: 74  . Highest education level: Not on file  Occupational History  . Occupation: Retired  Tobacco Use  . Smoking status: Never Smoker  . Smokeless tobacco: Never Used  Vaping Use  . Vaping Use: Never used  Substance and Sexual Activity  . Alcohol use: No  . Drug use: No  . Sexual activity: Not on file  Other Topics Concern  . Not on file  Social History Narrative   Divorced. Retired Pension scheme manager.   12th grade education.   Drinks caffeine.   Smoke alarm in the home. Wears her seatbelt.   Feels safe in her relationships.   Two story home   Right handed    Social Determinants of Health   Financial Resource Strain: Not on file  Food Insecurity: Not on file  Transportation Needs: Not on file  Physical Activity: Not on file  Stress: Not on file  Social Connections: Not on file  Intimate Partner Violence: Not on file   Allergies as of 10/28/2020      Reactions   Asa [aspirin] Other (See Comments)   Contraindicated d/t low plts   Nsaids Other (See Comments)   Contraindicated d/t low plts      Medication List       Accurate as of  Oct 28, 2020  5:57 PM. If you have any questions, ask your nurse or doctor.        STOP taking these medications   amoxicillin-clavulanate 875-125 MG tablet Commonly known as: AUGMENTIN Stopped by: Howard Pouch, DO   glyBURIDE 2.5 MG tablet Commonly known as: DIABETA Stopped by: Howard Pouch, DO   hydrochlorothiazide 12.5 MG tablet Commonly known as: HYDRODIURIL Stopped by: Howard Pouch, DO     TAKE these medications   B-12 1000 MCG Subl Place 1 tablet under the tongue daily. Started by: Howard Pouch, DO   ferrous sulfate 325 (65 FE) MG tablet Take 1 tablet (325 mg total) by mouth daily with breakfast.   Fish Oil 1000 MG Caps Take 1,000 mg by mouth daily.   gabapentin 300 MG capsule Commonly known as: NEURONTIN 900 my QHS and 300 mg every 8 hrs in day   glucose monitoring kit monitoring kit 1 each by Does not apply route as needed for other. Use as directed   metoprolol succinate 50 MG 24 hr tablet Commonly known as: TOPROL-XL Take 1 tablet (50 mg total) by mouth daily.   mirtazapine 15 MG disintegrating tablet Commonly known as: REMERON SOL-TAB Take 1 tablet (15 mg total) by mouth at bedtime. Started by: Howard Pouch, DO   sitaGLIPtin-metformin 50-1000 MG tablet Commonly known as: JANUMET Take 1 tablet by mouth at bedtime.   Vitamin D (Cholecalciferol) 25 MCG (1000 UT) Caps Take 1,000 Units by mouth daily.   zolpidem 5 MG tablet Commonly known as: AMBIEN Take 1 tablet (5 mg total) by mouth at bedtime.       All past medical history, surgical history, allergies, family history, immunizations andmedications were updated in the EMR today and reviewed under the history and medication portions of their EMR.      ROS: 14 pt review of systems performed and negative (unless mentioned in an HPI)  Objective: BP 106/67   Pulse (!) 106   Temp  98.2 F (36.8 C) (Oral)   Ht $R'5\' 3"'UT$  (1.6 m)   Wt 164 lb (74.4 kg)   SpO2 100%   BMI 29.05 kg/m  Gen: Afebrile. No  acute distress.  Very pleasant female, appears weak HENT: AT. [place]. Bilateral TM visualized and normal in appearance. MMM.  No cough.  No hoarseness.   Eyes:Pupils Equal Round Reactive to light, Extraocular movements intact,  Conjunctiva without redness, discharge or icterus. Neck/lymp/endocrine: Supple, left submandibular and left supraclavicular lymphadenopathy present, no obvious axillary lymphadenopathy present.  No thyromegaly CV: RRR 1/6 systolic murmur, no edema, +2/4 P posterior tibialis pulses Chest: CTAB, no wheeze or crackles Skin: Multiple purpura and bruising present over forearms.  Approximately 3 cm round excoriated hyperpigmented lesion right temple. Neuro: Normal gait. PERLA. EOMi. Alert. Oriented x3 Psych: Normal affect, dress and demeanor. Normal speech. Normal thought content and judgment.   Results for orders placed or performed in visit on 10/28/20 (from the past 48 hour(s))  POCT HgB A1C     Status: Abnormal   Collection Time: 10/28/20  9:14 AM  Result Value Ref Range   Hemoglobin A1C 5.7 (A) 4.0 - 5.6 %   HbA1c POC (<> result, manual entry) 5.7 4.0 - 5.6 %   HbA1c, POC (prediabetic range) 5.7 5.7 - 6.4 %   HbA1c, POC (controlled diabetic range) 5.7 0.0 - 7.0 %  CBC w/Diff     Status: Abnormal   Collection Time: 10/28/20  9:52 AM  Result Value Ref Range   WBC 9.2 4.0 - 10.5 K/uL   RBC 3.09 (L) 3.87 - 5.11 Mil/uL   Hemoglobin 8.9 Repeated and verified X2. (L) 12.0 - 15.0 g/dL   HCT 26.8 (L) 36.0 - 46.0 %   MCV 86.8 78.0 - 100.0 fl   MCHC 33.3 30.0 - 36.0 g/dL   RDW 19.8 (H) 11.5 - 15.5 %   Platelets 4.0 Repeated and verified X2. (LL) 150.0 - 400.0 K/uL    Comment: plt verified on smear   Neutrophils Relative % 73.5 43.0 - 77.0 %   Lymphocytes Relative 5.8 (L) 12.0 - 46.0 %   Monocytes Relative 19.9 Repeated and verified X2. (H) 3.0 - 12.0 %   Eosinophils Relative 0.2 0.0 - 5.0 %   Basophils Relative 0.6 0.0 - 3.0 %   Neutro Abs 6.7 1.4 - 7.7 K/uL   Lymphs Abs  0.5 (L) 0.7 - 4.0 K/uL   Monocytes Absolute 1.8 (H) 0.1 - 1.0 K/uL   Eosinophils Absolute 0.0 0.0 - 0.7 K/uL   Basophils Absolute 0.1 0.0 - 0.1 K/uL  TSH     Status: None   Collection Time: 10/28/20  9:52 AM  Result Value Ref Range   TSH 3.17 0.35 - 4.50 uIU/mL  Vitamin D (25 hydroxy)     Status: Abnormal   Collection Time: 10/28/20  9:52 AM  Result Value Ref Range   VITD 22.10 (L) 30.00 - 100.00 ng/mL  Comp Met (CMET)     Status: Abnormal   Collection Time: 10/28/20  9:52 AM  Result Value Ref Range   Sodium 136 135 - 145 mEq/L   Potassium 3.8 3.5 - 5.1 mEq/L   Chloride 103 96 - 112 mEq/L   CO2 27 19 - 32 mEq/L   Glucose, Bld 167 (H) 70 - 99 mg/dL   BUN 16 6 - 23 mg/dL   Creatinine, Ser 0.77 0.40 - 1.20 mg/dL   Total Bilirubin 0.8 0.2 - 1.2 mg/dL   Alkaline Phosphatase  48 39 - 117 U/L   AST 14 0 - 37 U/L   ALT 7 0 - 35 U/L   Total Protein 8.2 6.0 - 8.3 g/dL   Albumin 3.4 (L) 3.5 - 5.2 g/dL   GFR 78.85 >60.00 mL/min    Comment: Calculated using the CKD-EPI Creatinine Equation (2021)   Calcium 8.4 8.4 - 10.5 mg/dL  Sedimentation rate     Status: Abnormal   Collection Time: 10/28/20  9:52 AM  Result Value Ref Range   Sed Rate 51 (H) 0 - 30 mm/hr  C-reactive protein     Status: None   Collection Time: 10/28/20  9:52 AM  Result Value Ref Range   CRP <1.0 0.5 - 20.0 mg/dL    Assessment/plan: Kathleen Gibson is a 70 y.o. female present for establishment of care (from [place]) and chronic medical conditions. Essential hypertension/hyperlipidemia/tachycardia/obesity/statin declined -Stable -Continue metoprolol to 50 mg QD -Discontinue HCTZ 12.5 mg daily. - Low-sodium diet, exercise.  - ASA contraindicated with thrombocytopenia.  -Discontinue fish oil (plt drop). Statin declined - F/U 4 mos  Diabetic polyneuropathy associated with type 2 diabetes mellitus ([place]) type 2 diabetes mellitus (HCC)/diabetes type 2 -Improving. - continue to decrease carb and sugar -Continue Janumet  50-1000 mg QD.  -Discontinue glyburide -Continue gabapentin (doses below) - statin declined PNA series:completed Flu shot: Patient declined (recommneded yearly) Foot exam: completed 05/2020 >podiatry referral placed for DM foot care as well. Eye exam: 8/3/22020 completed- vision works at [employer].  Courage patient to schedule eye exam. A1c: 8..6--> 8.3--> 7.9--> 7.2--> 7.3 --> 6.6 >>> 6.9>> 6.7> 7.1> 7.2 >6.3> 5.7 today - F/U 4-6 mos.   INSOMNIA, CHRONIC/RLS -Insomnia is stable on Ambien.  However she is having some more anxiety surrounding her weakness/weight loss and likely malignancy.  We discussed trying Remeron in place of Ambien and she is agreeable to this today. -I will provide a prescription for the Ambien to have as backup in the event needed.  Patient aware she is not to take the Ambien and the Remeron. -Continue gabapentin 900 qhs, added gabapentin 300 mg q 8 in the day. -Continue iron twice daily.   Iron panel collected today. - tried trazodone- not effective.  - [place] reviewed 10/28/20   B12 deficiency/pernicious anemia/IDA/: B12 injection today, continue q. 4 weeks per nurse visit injection Pt was also educated on sublingual B12 since she has difficulty making routine nurse appts for injection.  Prescribed B12 sublingual for her.  Skin lesion: Skin lesion right temple approximately 3 cm black excoriated lesion likely squamous cell carcinoma.  Will refer to dermatology. Patient understands platelet count may play a role this can be removed safely.  Thrombocytopenia (HCC)/ITP//abdominal and pelvic lymphadenopathy/unintentional weight loss  She has had elevated monocytes since  ~2015  She has been complaining of quite a bit of fatigue and myalgias as well, that has been constant. Her platelets have been chronically low averaging about 50-80 Her hematologist suggested considering a bone biopsy at her last appointment.  He has had a positive SPEP free light  chains as well.  Pilar Plate discussion was completed last December between patient and myself about pursuing further work-up for a malignant process.  She understood the worsening abdominal and pelvic lymphadenopathy found on CT at last year's admission was most likely indicative of a malignant process.  She shared with this provider she does not feel she would want further evaluation or treatment if she does have a type of cancer.  She  states she would not want to worry her family.  She reports she has lived a good 42 years and if she has cancer, she would rather enjoy whatever year she has left and not move forward with any type of treatment or evaluation.  Pilar Plate discussion again today surrounding patient's worsening weakness and unintentional weight loss.  She is well aware that she likely has cancer of some type and it is causing her weight loss, dizziness and fatigue.  She has spoken with her children since her last appointment.  She states she made them aware of her wishes and she does not think they believe her.  She does backtrack today and reports she would be willing to go back to her oncologist now for further testing.  If it was a type of cancer she could take a medication for she may be willing to proceed with that treatment option. We discussed going ahead and repeating the CT of her abdomen to reevaluate the lymphadenopathy and spleen.  We would also like to have a CT of her chest/neck completed given lymphadenopathy palpated.  She is willing to have this repeated now. Referral back to her oncology team completed urgently CBC, CMP, CRP, ESR, LDH, TSH, vitamin D collected today. Lengthy discussion again today about advanced directives/MOST form completion.  She understands that she should complete these immediately and return to them to the office, so that we can scan them into the system, in order to have her wishes followed in the event needed.  Update:  Critical lab reported platelets of 4,  hemoglobin 8.9, hematocrit 26.8 Patient was called with critical lab.  She understands the extreme high risk of bleeding and the emergent need for evaluation both with imaging and platelets, given her likely diagnosis is malignancy as the cause.  She agreed to present to [facility] emergency room ASAP. Imaging orders agreed to above were not entered secondary to sending patient to the emergency room.     .Return in about 6 weeks (around 12/09/2020) for insomnia.  Orders Placed This Encounter  Procedures  . CBC w/Diff  . TSH  . Vitamin D (25 hydroxy)  . Comp Met (CMET)  . Sedimentation rate  . Lactate dehydrogenase  . C-reactive protein  . Iron, TIBC and Ferritin Panel  . Ambulatory referral to Dermatology  . POCT HgB A1C   Meds ordered this encounter  Medications  . sitaGLIPtin-metformin (JANUMET) 50-1000 MG tablet    Sig: Take 1 tablet by mouth at bedtime.    Dispense:  90 tablet    Refill:  1  . metoprolol succinate (TOPROL-XL) 50 MG 24 hr tablet    Sig: Take 1 tablet (50 mg total) by mouth daily.    Dispense:  90 tablet    Refill:  1    Please remind patient this is now ONCE a day (changed from tartrate to succinate)  . gabapentin (NEURONTIN) 300 MG capsule    Sig: 900 my QHS and 300 mg every 8 hrs in day    Dispense:  450 capsule    Refill:  1  . ferrous sulfate 325 (65 FE) MG tablet    Sig: Take 1 tablet (325 mg total) by mouth daily with breakfast.    Dispense:  90 tablet    Refill:  3  . Cyanocobalamin (B-12) 1000 MCG SUBL    Sig: Place 1 tablet under the tongue daily.    Dispense:  90 tablet    Refill:  3  .  mirtazapine (REMERON SOL-TAB) 15 MG disintegrating tablet    Sig: Take 1 tablet (15 mg total) by mouth at bedtime.    Dispense:  90 tablet    Refill:  1  . cyanocobalamin ((VITAMIN B-12)) injection 1,000 mcg    > 60 Minutes was dedicated to this patient's encounter to include pre-visit review of chart, face-to-face time with patient, lengthy review of  prior CTs and labs with patient as well as advance care directive discussion, as well as discussion surrounding malignancy and post-visit work- which include documentation, prescribing medications, placing referrals and/or ordering test.   Note is dictated utilizing voice recognition software. Although note has been proof read prior to signing, occasional typographical errors still can be missed. If any questions arise, please do not hesitate to call for verification.  Electronically signed by: Howard Pouch, DO [place]

## 2020-10-28 NOTE — ED Notes (Signed)
Patient ambulated with stand by assistance to bathroom

## 2020-10-28 NOTE — ED Notes (Signed)
Second pink top sent for ABO typing per blood bank request

## 2020-10-28 NOTE — ED Provider Notes (Addendum)
Emergency Medicine Provider Triage Evaluation Note  Kathleen Gibson , a 70 y.o. female  was evaluated in triage.  Pt complains of sent by PCP for low platelets, accepts transfusion if needed. Went for 41mo check up today feeling weak, dizzy, fatigued. History of low platelets in the past but worse today, unsure if she has had a transfusion in the past. No CP, mild SHOB which she relates to anxiety at this time.  Nose bleeds a few weeks ago. Easy bruising.   Review of Systems  Positive: Easy bruising, dizzy, fatigued  Negative: CP  Physical Exam  BP (!) 152/76 (BP Location: Right Arm)   Pulse (!) 123   Temp 99 F (37.2 C) (Oral)   Resp (!) 22   SpO2 100%  Gen:   Awake, no distress   Resp:  Normal effort  MSK:   Moves extremities without difficulty  Other:  Bruising   Medical Decision Making  Medically screening exam initiated at 6:05 PM.  Appropriate orders placed.  [name] was informed that the remainder of the evaluation will be completed by another provider, this initial triage assessment does not replace that evaluation, and the importance of remaining in the ED until their evaluation is complete.  Fasting for labs, has not taken any meds today including her betablocker.     Tacy Learn, PA-C 10/28/20 1809    Tacy Learn, PA-C 10/28/20 1811    Arnaldo Natal, MD 10/28/20 [phone]

## 2020-10-29 ENCOUNTER — Inpatient Hospital Stay (HOSPITAL_COMMUNITY): Payer: Medicare HMO

## 2020-10-29 ENCOUNTER — Encounter (HOSPITAL_COMMUNITY): Payer: Self-pay | Admitting: Internal Medicine

## 2020-10-29 DIAGNOSIS — Z806 Family history of leukemia: Secondary | ICD-10-CM | POA: Diagnosis not present

## 2020-10-29 DIAGNOSIS — Z886 Allergy status to analgesic agent status: Secondary | ICD-10-CM | POA: Diagnosis not present

## 2020-10-29 DIAGNOSIS — K760 Fatty (change of) liver, not elsewhere classified: Secondary | ICD-10-CM | POA: Diagnosis not present

## 2020-10-29 DIAGNOSIS — E1142 Type 2 diabetes mellitus with diabetic polyneuropathy: Secondary | ICD-10-CM | POA: Diagnosis not present

## 2020-10-29 DIAGNOSIS — R161 Splenomegaly, not elsewhere classified: Secondary | ICD-10-CM | POA: Diagnosis not present

## 2020-10-29 DIAGNOSIS — Z9119 Patient's noncompliance with other medical treatment and regimen: Secondary | ICD-10-CM | POA: Diagnosis not present

## 2020-10-29 DIAGNOSIS — K5750 Diverticulosis of both small and large intestine without perforation or abscess without bleeding: Secondary | ICD-10-CM | POA: Diagnosis not present

## 2020-10-29 DIAGNOSIS — Z9884 Bariatric surgery status: Secondary | ICD-10-CM | POA: Diagnosis not present

## 2020-10-29 DIAGNOSIS — E538 Deficiency of other specified B group vitamins: Secondary | ICD-10-CM | POA: Diagnosis not present

## 2020-10-29 DIAGNOSIS — Z20822 Contact with and (suspected) exposure to covid-19: Secondary | ICD-10-CM | POA: Diagnosis not present

## 2020-10-29 DIAGNOSIS — Z8249 Family history of ischemic heart disease and other diseases of the circulatory system: Secondary | ICD-10-CM | POA: Diagnosis not present

## 2020-10-29 DIAGNOSIS — I700 Atherosclerosis of aorta: Secondary | ICD-10-CM | POA: Diagnosis not present

## 2020-10-29 DIAGNOSIS — E11649 Type 2 diabetes mellitus with hypoglycemia without coma: Secondary | ICD-10-CM | POA: Diagnosis not present

## 2020-10-29 DIAGNOSIS — Z833 Family history of diabetes mellitus: Secondary | ICD-10-CM | POA: Diagnosis not present

## 2020-10-29 DIAGNOSIS — D696 Thrombocytopenia, unspecified: Secondary | ICD-10-CM | POA: Diagnosis not present

## 2020-10-29 DIAGNOSIS — J8410 Pulmonary fibrosis, unspecified: Secondary | ICD-10-CM | POA: Diagnosis not present

## 2020-10-29 DIAGNOSIS — K828 Other specified diseases of gallbladder: Secondary | ICD-10-CM | POA: Diagnosis not present

## 2020-10-29 DIAGNOSIS — D72821 Monocytosis (symptomatic): Secondary | ICD-10-CM | POA: Diagnosis not present

## 2020-10-29 DIAGNOSIS — E782 Mixed hyperlipidemia: Secondary | ICD-10-CM | POA: Diagnosis present

## 2020-10-29 DIAGNOSIS — J9811 Atelectasis: Secondary | ICD-10-CM | POA: Diagnosis not present

## 2020-10-29 DIAGNOSIS — D62 Acute posthemorrhagic anemia: Secondary | ICD-10-CM | POA: Diagnosis not present

## 2020-10-29 DIAGNOSIS — R591 Generalized enlarged lymph nodes: Secondary | ICD-10-CM | POA: Diagnosis not present

## 2020-10-29 DIAGNOSIS — E1169 Type 2 diabetes mellitus with other specified complication: Secondary | ICD-10-CM | POA: Diagnosis not present

## 2020-10-29 DIAGNOSIS — Z6830 Body mass index (BMI) 30.0-30.9, adult: Secondary | ICD-10-CM | POA: Diagnosis not present

## 2020-10-29 DIAGNOSIS — Z79899 Other long term (current) drug therapy: Secondary | ICD-10-CM | POA: Diagnosis not present

## 2020-10-29 DIAGNOSIS — Z66 Do not resuscitate: Secondary | ICD-10-CM | POA: Diagnosis not present

## 2020-10-29 DIAGNOSIS — K7460 Unspecified cirrhosis of liver: Secondary | ICD-10-CM | POA: Diagnosis not present

## 2020-10-29 DIAGNOSIS — D649 Anemia, unspecified: Secondary | ICD-10-CM | POA: Diagnosis not present

## 2020-10-29 DIAGNOSIS — E669 Obesity, unspecified: Secondary | ICD-10-CM | POA: Diagnosis not present

## 2020-10-29 DIAGNOSIS — E1165 Type 2 diabetes mellitus with hyperglycemia: Secondary | ICD-10-CM | POA: Diagnosis present

## 2020-10-29 DIAGNOSIS — K8020 Calculus of gallbladder without cholecystitis without obstruction: Secondary | ICD-10-CM | POA: Diagnosis not present

## 2020-10-29 DIAGNOSIS — R590 Localized enlarged lymph nodes: Secondary | ICD-10-CM | POA: Diagnosis present

## 2020-10-29 DIAGNOSIS — D693 Immune thrombocytopenic purpura: Secondary | ICD-10-CM | POA: Diagnosis not present

## 2020-10-29 DIAGNOSIS — T380X5A Adverse effect of glucocorticoids and synthetic analogues, initial encounter: Secondary | ICD-10-CM | POA: Diagnosis present

## 2020-10-29 DIAGNOSIS — D72825 Bandemia: Secondary | ICD-10-CM | POA: Diagnosis not present

## 2020-10-29 DIAGNOSIS — R599 Enlarged lymph nodes, unspecified: Secondary | ICD-10-CM | POA: Diagnosis not present

## 2020-10-29 DIAGNOSIS — R69 Illness, unspecified: Secondary | ICD-10-CM | POA: Diagnosis not present

## 2020-10-29 DIAGNOSIS — R188 Other ascites: Secondary | ICD-10-CM | POA: Diagnosis not present

## 2020-10-29 DIAGNOSIS — J189 Pneumonia, unspecified organism: Secondary | ICD-10-CM | POA: Diagnosis not present

## 2020-10-29 DIAGNOSIS — I10 Essential (primary) hypertension: Secondary | ICD-10-CM | POA: Diagnosis not present

## 2020-10-29 DIAGNOSIS — D510 Vitamin B12 deficiency anemia due to intrinsic factor deficiency: Secondary | ICD-10-CM | POA: Diagnosis present

## 2020-10-29 LAB — CBC WITH DIFFERENTIAL/PLATELET
Abs Immature Granulocytes: 0.5 10*3/uL — ABNORMAL HIGH (ref 0.00–0.07)
Basophils Absolute: 0 10*3/uL (ref 0.0–0.1)
Basophils Relative: 0 %
Eosinophils Absolute: 0 10*3/uL (ref 0.0–0.5)
Eosinophils Relative: 0 %
HCT: 27 % — ABNORMAL LOW (ref 36.0–46.0)
Hemoglobin: 8.1 g/dL — ABNORMAL LOW (ref 12.0–15.0)
Lymphocytes Relative: 7 %
Lymphs Abs: 0.6 10*3/uL — ABNORMAL LOW (ref 0.7–4.0)
MCH: 27.7 pg (ref 26.0–34.0)
MCHC: 30 g/dL (ref 30.0–36.0)
MCV: 92.5 fL (ref 80.0–100.0)
Metamyelocytes Relative: 3 %
Monocytes Absolute: 0.9 10*3/uL (ref 0.1–1.0)
Monocytes Relative: 11 %
Myelocytes: 2 %
Neutro Abs: 6.5 10*3/uL (ref 1.7–7.7)
Neutrophils Relative %: 76 %
Platelets: <5 10*3/uL — CL (ref 150–400)
Promyelocytes Relative: 1 %
RBC: 2.92 MIL/uL — ABNORMAL LOW (ref 3.87–5.11)
RDW: 18.8 % — ABNORMAL HIGH (ref 11.5–15.5)
WBC: 8.5 10*3/uL (ref 4.0–10.5)
nRBC: 0 /100 WBC
nRBC: 0.2 % (ref 0.0–0.2)

## 2020-10-29 LAB — IMMATURE PLATELET FRACTION: Immature Platelet Fraction: 0 % — ABNORMAL LOW (ref 1.2–8.6)

## 2020-10-29 LAB — GLUCOSE, CAPILLARY
Glucose-Capillary: 148 mg/dL — ABNORMAL HIGH (ref 70–99)
Glucose-Capillary: 117 mg/dL — ABNORMAL HIGH (ref 70–99)
Glucose-Capillary: 157 mg/dL — ABNORMAL HIGH (ref 70–99)
Glucose-Capillary: 98 mg/dL (ref 70–99)
Glucose-Capillary: 118 mg/dL — ABNORMAL HIGH (ref 70–99)

## 2020-10-29 LAB — DIRECT ANTIGLOBULIN TEST (NOT AT ARMC)
DAT, IgG: POSITIVE
DAT, complement: NEGATIVE

## 2020-10-29 LAB — HIV ANTIBODY (ROUTINE TESTING W REFLEX): HIV Screen 4th Generation wRfx: NONREACTIVE

## 2020-10-29 LAB — COMPREHENSIVE METABOLIC PANEL
ALT: 9 U/L (ref 0–44)
AST: 16 U/L (ref 15–41)
Albumin: 2.9 g/dL — ABNORMAL LOW (ref 3.5–5.0)
Alkaline Phosphatase: 40 U/L (ref 38–126)
Anion gap: 5 (ref 5–15)
BUN: 10 mg/dL (ref 8–23)
CO2: 26 mmol/L (ref 22–32)
Calcium: 8 mg/dL — ABNORMAL LOW (ref 8.9–10.3)
Chloride: 108 mmol/L (ref 98–111)
Creatinine, Ser: 0.77 mg/dL (ref 0.44–1.00)
GFR, Estimated: >60 mL/min (ref >60)
Glucose, Bld: 116 mg/dL — ABNORMAL HIGH (ref 70–99)
Potassium: 3.4 mmol/L — ABNORMAL LOW (ref 3.5–5.1)
Sodium: 139 mmol/L (ref 135–145)
Total Bilirubin: 0.9 mg/dL (ref 0.3–1.2)
Total Protein: 7.8 g/dL (ref 6.5–8.1)

## 2020-10-29 LAB — HEPATITIS B SURFACE ANTIGEN: Hepatitis B Surface Ag: NONREACTIVE

## 2020-10-29 LAB — IRON AND TIBC
Iron: 47 ug/dL (ref 28–170)
Saturation Ratios: 15 % (ref 10.4–31.8)
TIBC: 311 ug/dL (ref 250–450)
UIBC: 264 ug/dL

## 2020-10-29 LAB — RETICULOCYTES
Immature Retic Fract: 25.3 % — ABNORMAL HIGH (ref 2.3–15.9)
RBC.: 2.87 MIL/uL — ABNORMAL LOW (ref 3.87–5.11)
Retic Count, Absolute: 100.2 10*3/uL (ref 19.0–186.0)
Retic Ct Pct: 3.5 % — ABNORMAL HIGH (ref 0.4–3.1)

## 2020-10-29 LAB — LACTATE DEHYDROGENASE: LDH: 155 U/L (ref 98–192)

## 2020-10-29 LAB — HEMOGLOBIN A1C
Hgb A1c MFr Bld: 5.9 % — ABNORMAL HIGH (ref 4.8–5.6)
Mean Plasma Glucose: 122.63 mg/dL

## 2020-10-29 LAB — HEPATITIS C ANTIBODY: HCV Ab: NONREACTIVE

## 2020-10-29 LAB — PATHOLOGIST SMEAR REVIEW

## 2020-10-29 LAB — VITAMIN B12: Vitamin B-12: 3903 pg/mL — ABNORMAL HIGH (ref 180–914)

## 2020-10-29 LAB — MAGNESIUM: Magnesium: 1.8 mg/dL (ref 1.7–2.4)

## 2020-10-29 LAB — FOLATE: Folate: 10.3 ng/mL (ref >5.9)

## 2020-10-29 LAB — FERRITIN: Ferritin: 27 ng/mL (ref 11–307)

## 2020-10-29 MED ORDER — IOHEXOL 9 MG/ML PO SOLN
500.0000 mL | ORAL | Status: AC
  Administered 2020-10-29 (×2): 500 mL via ORAL

## 2020-10-29 MED ORDER — PREDNISONE 50 MG PO TABS
60.0000 mg | ORAL_TABLET | Freq: Every day | ORAL | Status: DC
  Administered 2020-10-30 – 2020-11-05 (×7): 60 mg via ORAL
  Filled 2020-10-29 (×7): qty 1

## 2020-10-29 MED ORDER — CYANOCOBALAMIN 1000 MCG/ML IJ SOLN
1000.0000 ug | Freq: Every day | INTRAMUSCULAR | Status: DC
  Filled 2020-10-29: qty 1

## 2020-10-29 MED ORDER — FOLIC ACID 1 MG PO TABS
1.0000 mg | ORAL_TABLET | Freq: Every day | ORAL | Status: DC
  Administered 2020-10-29 – 2020-11-10 (×13): 1 mg via ORAL
  Filled 2020-10-29 (×15): qty 1

## 2020-10-29 MED ORDER — POTASSIUM CHLORIDE CRYS ER 20 MEQ PO TBCR
20.0000 meq | EXTENDED_RELEASE_TABLET | Freq: Two times a day (BID) | ORAL | Status: DC
  Administered 2020-10-29 – 2020-11-10 (×26): 20 meq via ORAL
  Filled 2020-10-29 (×27): qty 1

## 2020-10-29 NOTE — Plan of Care (Signed)
  Problem: Clinical Measurements: Goal: Respiratory complications will improve Outcome: Progressing   

## 2020-10-29 NOTE — Consult Note (Addendum)
[place]  [phone] [phone]) [phone]    [place]  Referring MD:  Dr. Charlynne Cousins  Reason for Referral: Thrombocytopenia and anemia  HPI: Ms. Brundage is a 70 year old female with a past medical history significant for diabetes mellitus type 2, diabetic polyneuropathy, ITP (initially diagnosed 2014), gastric bypass surgery (2000), hyperlipidemia, hypertension, pernicious anemia/vitamin B12 deficiency, nonalcoholic fatty liver disease.  The patient was sent to the emergency room by her primary care provider due to severe thrombocytopenia.  The patient has had generalized malaise, weakness, poor appetite.  She also has persistent myalgias.  She has developed epistaxis recently and was able to control with conservative measures.  Due to persistent symptoms, she was seen by a telemedicine provider who prescribed a course of antibiotics.  Patient unsure what antibiotic she was given.  She has gradually lost about 12 pounds in the past few weeks.  Due to persistent symptoms, she was seen by her primary care provider and had lab work and was told to go to the emergency room due to a platelet count of 4000.  CBC in the emergency room showed a WBC of 10.5, hemoglobin 8.3, hematocrit 27.4, platelets less than 5000.  Her T bili was normal at 0.9.  DAT complement negative and DAT IgG positive.  Folate normal at 10.3, vitamin B12 elevated at 3903, ferritin 27, iron 47, TIBC 311, percent saturation 15%.  LDH was normal at 155 and immature platelet fraction was 0.0.  Haptoglobin pending.  The patient received 1 unit of platelets so far this admission.  She has been started on prednisone 60 mg daily and also started on IVIG 1 g/kg x 2 doses. She had an ultrasound of the right upper quadrant which showed gallbladder sludge and cholelithiasis.  Unfortunately, spleen not visualized with this test.  Review of her records show that she had a CT of the  abdomen/pelvis without contrast performed 09/05/2019 which showed no acute findings in the abdomen/pelvis, interval worsening adenopathy over the upper abdomen, mesenteric, periaortic region, and iliac chains.  Adenopathy worse over the region of the celiac axis with lymph nodes measuring up to 1.8 cm and findings are concerning for neoplastic process.  It appears that she followed up with her primary care provider for this abnormal finding and indicated that she did not want further evaluation or treatment if she did have a malignancy.  It appears that a repeat CT scan was ordered by her primary care provider for further evaluation, but this test was never performed.  The patient was last seen in our office on 11/20/2017 and a bone marrow biopsy was recommended.  She did not have this done and did not keep her follow-up appointments with [place].  The patient was seen in her hospital room.  No family at bedside.  The patient reports significant fatigue.  She reports that she has had intermittent epistaxis over the past 2 weeks which has now resolved.  She denies any other bleeding.  She denies fevers and chills.  She has not had any night sweats.  She reports appetite has been decreased and she would estimate that she has lost 10 to 12 pounds in the past few weeks.  She is not having any headaches or dizziness.  Denies chest pain shortness of breath.  Reports abdominal distention but no pain, nausea, vomiting.  Hematology was asked see the patient make recommendations regarding her thrombocytopenia and anemia.  Past Medical History:  Diagnosis Date  .  Diabetes ([place])   . HLD (hyperlipidemia)   . Hypertension   . Pernicious anemia   . Pneumonia 09/05/2019  . Renal insufficiency 09/06/2019  . Vitamin D deficiency   :    Past Surgical History:  Procedure Laterality Date  . ABDOMINAL HERNIA REPAIR  2009  . ABDOMINOPLASTY    . [place]  . GASTRIC BYPASS  2000  . hemorroid surgery  2007  .  TONSILLECTOMY AND ADENOIDECTOMY  1957  :   CURRENT MEDS: Current Facility-Administered Medications  Medication Dose Route Frequency Provider Last Rate Last Admin  . acetaminophen (TYLENOL) tablet 650 mg  650 mg Oral Q6H PRN Shalhoub, Sherryll Burger, MD       Or  . acetaminophen (TYLENOL) suppository 650 mg  650 mg Rectal Q6H PRN Shalhoub, Sherryll Burger, MD      . acetaminophen (TYLENOL) tablet 650 mg  650 mg Oral Once Vernelle Emerald, MD      . diphenhydrAMINE (BENADRYL) capsule 25 mg  25 mg Oral Once Vernelle Emerald, MD      . folic acid (FOLVITE) tablet 1 mg  1 mg Oral Daily Charlynne Cousins, MD      . gabapentin (NEURONTIN) capsule 800 mg  800 mg Oral TID Vernelle Emerald, MD   800 mg at 10/29/20 0148  . Immune Globulin 10% (PRIVIGEN) IV infusion 75 g  1 g/kg Intravenous Q24 Hr x 2 Shalhoub, Sherryll Burger, MD      . insulin aspart (novoLOG) injection 0-15 Units  0-15 Units Subcutaneous TID AC & HS Shalhoub, Sherryll Burger, MD      . melatonin tablet 10 mg  10 mg Oral QHS PRN Shalhoub, Sherryll Burger, MD      . metoprolol succinate (TOPROL-XL) 24 hr tablet 50 mg  50 mg Oral Daily Shalhoub, Sherryll Burger, MD      . mirtazapine (REMERON SOL-TAB) disintegrating tablet 15 mg  15 mg Oral QHS Vernelle Emerald, MD   15 mg at 10/29/20 0148  . ondansetron (ZOFRAN) injection 4 mg  4 mg Intravenous Once Shalhoub, Sherryll Burger, MD      . ondansetron [facility]) tablet 4 mg  4 mg Oral Q6H PRN Shalhoub, Sherryll Burger, MD       Or  . ondansetron (ZOFRAN) injection 4 mg  4 mg Intravenous Q6H PRN Shalhoub, Sherryll Burger, MD      . polyethylene glycol (MIRALAX / GLYCOLAX) packet 17 g  17 g Oral Daily PRN Shalhoub, Sherryll Burger, MD      . potassium chloride SA (KLOR-CON) CR tablet 20 mEq  20 mEq Oral BID Charlynne Cousins, MD      . Derrill Memo ON 10/30/2020] predniSONE (DELTASONE) tablet 60 mg  60 mg Oral Q breakfast Charlynne Cousins, MD          Allergies  Allergen Reactions  . Asa [Aspirin] Other (See Comments)    Contraindicated d/t  low plts  . Nsaids Other (See Comments)    Contraindicated d/t low plts  :  Family History  Problem Relation Age of Onset  . Cancer Mother 41       unknown type cancer   . Hypertension Mother   . Hypertension Father   . Heart failure Father   . Pneumonia Father   . Diabetes Sister   . Leukemia Brother   . Colon cancer Brother 23  . Heart Problems Brother   . Leukemia Other   . Diabetes Sister   .  Heart Problems Sister   . Cancer Brother   . Heart Problems Brother   :  Social History   Socioeconomic History  . Marital status: Divorced    Spouse name: Not on file  . Number of children: 3  . Years of education: 72  . Highest education level: Not on file  Occupational History  . Occupation: Retired  Tobacco Use  . Smoking status: Never Smoker  . Smokeless tobacco: Never Used  Vaping Use  . Vaping Use: Never used  Substance and Sexual Activity  . Alcohol use: No  . Drug use: No  . Sexual activity: Not on file  Other Topics Concern  . Not on file  Social History Narrative   Divorced. Retired Pension scheme manager.   12th grade education.   Drinks caffeine.   Smoke alarm in the home. Wears her seatbelt.   Feels safe in her relationships.   Two story home   Right handed    Social Determinants of Health   Financial Resource Strain: Not on file  Food Insecurity: Not on file  Transportation Needs: Not on file  Physical Activity: Not on file  Stress: Not on file  Social Connections: Not on file  Intimate Partner Violence: Not on file  :  REVIEW OF SYSTEMS:  A comprehensive 14 point review of systems was negative except as noted in the HPI.    Exam: Patient Vitals for the past 24 hrs:  BP Temp Temp src Pulse Resp SpO2 Height Weight  10/29/20 0928 117/67 98.1 F (36.7 C) Oral 100 18 96 % -- --  10/29/20 0455 118/78 98 F (36.7 C) Oral 99 17 97 % -- --  10/29/20 0228 120/60 98.4 F (36.9 C) Oral (!) 103 16 96 % -- --  10/29/20 0210 (!) 110/53 (!) 97.5 F  (36.4 C) Oral 99 17 98 % -- --  10/29/20 0125 135/74 97.6 F (36.4 C) Oral (!) 106 16 97 % $Re'5\' 3"'GzU$  (1.6 m) 75.7 kg  10/29/20 0116 -- 98.2 F (36.8 C) Oral -- -- -- -- --  10/29/20 0030 (!) 108/56 -- -- 99 (!) 21 99 % -- --  10/29/20 0000 112/62 -- -- 98 (!) 25 99 % -- --  10/28/20 2230 (!) 102/53 -- -- 99 (!) 23 99 % -- --  10/28/20 2215 (!) 118/59 -- -- 98 (!) 23 100 % -- --  10/28/20 2200 114/66 -- -- 99 (!) 24 99 % -- --  10/28/20 2145 118/66 -- -- 99 (!) 25 99 % -- --  10/28/20 2130 114/67 -- -- (!) 103 (!) 26 99 % -- --  10/28/20 2115 124/67 -- -- (!) 105 (!) 24 100 % -- --  10/28/20 2100 112/63 -- -- (!) 106 (!) 24 99 % -- --  10/28/20 2045 (!) 99/59 -- -- (!) 102 (!) 24 98 % -- --  10/28/20 2030 119/63 -- -- (!) 102 (!) 23 99 % -- --  10/28/20 2015 117/66 -- -- 99 (!) 24 100 % -- --  10/28/20 2000 117/61 -- -- (!) 102 (!) 25 99 % -- --  10/28/20 1945 124/60 -- -- (!) 104 (!) 21 99 % -- --  10/28/20 1930 111/67 -- -- (!) 106 (!) 21 100 % -- --  10/28/20 1915 124/62 -- -- (!) 107 17 99 % -- --  10/28/20 1900 114/60 -- -- (!) 108 20 100 % -- --  10/28/20 1811 -- -- -- (!) 113 -- 100 % -- --  10/28/20 1803 (!) 152/76 99 F (37.2 C) Oral (!) 123 (!) 22 100 % -- --    General: Awake alert, no distress Eyes:  no scleral icterus.   ENT:  There were no oropharyngeal lesions.   Lymphatics: Palpable left supraclavicular lymph node Respiratory: lungs were clear bilaterally without wheezing or crackles.   Cardiovascular:  Regular rate and rhythm, S1/S2, without murmur, rub or gallop.  There was no pedal edema.   GI: Positive bowel sounds, soft, fullness in the upper abdomen. Skin: She has scattered ecchymoses on her arms, legs, abdomen.  She also has faint petechiae noted on her lower extremities, upper arms, and chest. Neuro exam was nonfocal.  Patient was alert and oriented.  Attention was good.   Language was appropriate.  Mood was normal without depression.  Speech was not pressured.   Thought content was not tangential.   Breast exam: No palpable mass, skin change or nipple discharge  LABS:  Lab Results  Component Value Date   WBC 8.5 10/29/2020   HGB 8.1 (L) 10/29/2020   HCT 27.0 (L) 10/29/2020   PLT <5 (LL) 10/29/2020   GLUCOSE 116 (H) 10/29/2020   CHOL 134 11/07/2017   TRIG 171.0 (H) 11/07/2017   HDL 29.40 (L) 11/07/2017   LDLDIRECT 93.0 05/25/2016   LDLCALC 70 11/07/2017   ALT 9 10/29/2020   AST 16 10/29/2020   NA 139 10/29/2020   K 3.4 (L) 10/29/2020   CL 108 10/29/2020   CREATININE 0.77 10/29/2020   BUN 10 10/29/2020   CO2 26 10/29/2020   INR 1.2 10/28/2020   HGBA1C 5.9 (H) 10/29/2020   MICROALBUR <0.7 03/19/2015    US Abdomen Limited RUQ (LIVER/GB)  Result Date: 10/28/2020 CLINICAL DATA:  Follow-up cirrhosis EXAM: ULTRASOUND ABDOMEN LIMITED RIGHT UPPER QUADRANT COMPARISON:  09/05/2019 FINDINGS: Gallbladder: Gallbladder is well distended with gallbladder sludge within. Previously seen calculi are noted within the dependent sludge. No wall thickening or pericholecystic fluid is noted. Negative sonographic Murphy's sign is elicited. Common bile duct: Diameter: 2.3 mm. Liver: No focal lesion identified. Within normal limits in parenchymal echogenicity. Portal vein is patent on color Doppler imaging with normal direction of blood flow towards the liver. Other: None. IMPRESSION: Gallbladder sludge and cholelithiasis. No complicating factors are noted. Electronically Signed   By: Inez Catalina M.D.   On: 10/28/2020 23:34     ASSESSMENT AND PLAN:  Ms. Safi is a 70 year old female with history of ITP and lymphadenopathy who presented to the hospital with severe thrombocytopenia and anemia.  1. Severe thrombocytopenia 2. Normocytic anemia 3. Lymphadenopathy concerning for neoplastic process 4. Type 2 diabetes mellitus with polyneuropathy 5.  Hypertension  -Lab results and prior CT scans discussed with the patient.  We discussed that this may be an acute  flare of her ITP or possibly bone marrow infiltration from underlying lymphoma. -Recommend additional work-up including a CT of the chest/abdomen/pelvis which we done without contrast due to national shortage of contrast.  I also discussed proceeding with a bone marrow biopsy by interventional radiology.  She agrees and these orders have been entered. -The patient has normal LDH and T bili making hemolysis less likely.  Reticulocytes and haptoglobin currently pending.  Her DAT, complement was negative and DAT, IgG was positive. -Continue prednisone 60 mg daily and IVIG 1 g/kg x 2 doses. -We will consider addition of Nplate following bone marrow biopsy. -Additional treatment recommendations pending CT scan results and bone marrow biopsy results.  Thank you for this  referral.  Mikey Bussing, DNP, AGPCNP-BC, AOCNP  Addendum  I have seen the patient, examined her. I agree with the assessment and and plan and have edited the notes.   70 yo female with PMH of chronic thrombocytopenia likely related to ITP and splenomagely, B12 deficiency, diabetes and hypertension, presented with severe thrombocytopenia and a worsening diffuse adenopathy, which has been on previous CT scan in 2021 and 2017.  She has diffuse palpable adenopathy in left cervical, bilateral axilla and inguinal area today, concerning for lymphoma, especially indolent lymphoma such as Fortical lymphoma or SLL, although richter transformation is a possibility given her severe thrombocytopenia and worsening anemia.  Anemia work-up was negative for nutritional anemia, reticulocyte count is not significantly elevated, LDH normal, bilirubin normal, no lab evidence for hemolysis, although her Coombs test was positive for IgG.  I doubt she has significant hemolytic anemia (mild hemolysis is possible).  I recommend CT CAP and bone marrow biopsy, and left supraclavicular lymph node core biopsy by IR tomorrow. I agree with IVIG, steroids for her severe  thrombocytopenia, and may add Nplate if she does not respond well in the next few days. All questions were answered. We will f/u close.   Truitt Merle  10/29/2020

## 2020-10-29 NOTE — ED Notes (Signed)
PT ambulated to restroom freely and without complication.

## 2020-10-29 NOTE — Progress Notes (Signed)
[facility] PROGRESS NOTE    Progress Note  Kathleen Gibson  HQI:[id number] DOB: Dec 20, 1950 DOA: 10/28/2020 PCP: [name], DO     Brief Narrative:   Kathleen Gibson is an 70 y.o. female past medical history of diabetes mellitus type 2, diabetic neuropathy, ITP diagnosed in 2014 and followed by Dr. Annamaria Boots as an outpatient, medical noncompliance, gastric bypass in 2000 essential hypertension pernicious anemia due to B12 deficiency nonalcoholic fatty liver disease comes into the [facility] due to severe thrombocytopenia, she relates she about 8 weeks ago she and her family got sick and since then she has been feeling down.  She relates poor appetite and severe malaise started developing some nosebleeds which was able to control with compression maneuvers.  Her platelet count in the ED was 5 she is transfuse 1 unit of packed platelets hematology was consulted.    Significant Events: 10/28/2020 1 unit of packed platelets  Significant studies: 10/28/2020 abdominal ultrasound gallbladder sludge no acute cholecystitis  Antibiotics: None   Assessment/Plan:   Acute on chronic ITP ([place]): Immature platelet fraction was sent. Target platelet count is 10,000 or greater or if there is evidence of bleeding.  30 minutes post transfusional. Hematology was consulted recommended start prednisone as well as a 2-day course of IVIG. She is work-up is underway has been negative so far. Monitor hemoglobin for signs of bleeding if hemoglobin less than 7 we will go ahead and transfuse. Her platelets were always 3000. Also give 2 doses of Tylenol Zofran and Benadryl pre-IVIG.  Type 2 diabetes mellitus with diabetic polyneuropathy, without long-term current use of insulin  Hold home oral hypoglycemic agents. A1C 5.9.  She is currently on sliding scale insulin meal coverage blood glucose fairly controlled.  Diabetic polyneuropathy associated with type 2 diabetes mellitus (HCC) Continue  gabapentin.  Essential hypertension Continue metoprolol.  B12 deficiency Her B12 is 4000 we will hold on on repletion    History of noncompliance with medical treatment She has been counseled.  She has been counseled  Abdominal lymphadenopathy Further work-up as an outpatient.  Normocytic anemia Slight drop in hemoglobin compared to previous months ago, she relates no significant bleeding. Her B12 was 4000 continue folate repletion. Anemia panel showed ferritin of 27 we will start her on oral repletion as an outpatient.    DVT prophylaxis: SCD Family Communication:none Status is: Observation  The patient will require care spanning > 2 midnights and should be moved to inpatient because: Hemodynamically unstable  Dispo: The patient is from: Home              Anticipated d/c is to: Home              Patient currently is not medically stable to d/c.   Difficult to place patient No    Code Status:     Code Status Orders  (From admission, onward)         Start     Ordered   10/28/20 2337  Do not attempt resuscitation (DNR)  Continuous       Question Answer Comment  In the event of cardiac or respiratory ARREST Do not call a "code blue"   In the event of cardiac or respiratory ARREST Do not perform Intubation, CPR, defibrillation or ACLS   In the event of cardiac or respiratory ARREST Use medication by any route, position, wound care, and other measures to relive pain and suffering. May use oxygen, suction and manual treatment of  airway obstruction as needed for comfort.      10/28/20 2336        Code Status History    Date Active Date Inactive Code Status Order ID Comments User Context   09/06/2019 0407 09/07/2019 1751 Full Code [id number]  Kathleen Bulls, MD Inpatient   Advance Care Planning Activity        IV Access:    Peripheral IV   Procedures and diagnostic studies:   US Abdomen Limited RUQ (LIVER/GB)  Result Date: 10/28/2020 CLINICAL DATA:   Follow-up cirrhosis EXAM: ULTRASOUND ABDOMEN LIMITED RIGHT UPPER QUADRANT COMPARISON:  09/05/2019 FINDINGS: Gallbladder: Gallbladder is well distended with gallbladder sludge within. Previously seen calculi are noted within the dependent sludge. No wall thickening or pericholecystic fluid is noted. Negative sonographic Murphy's sign is elicited. Common bile duct: Diameter: 2.3 mm. Liver: No focal lesion identified. Within normal limits in parenchymal echogenicity. Portal vein is patent on color Doppler imaging with normal direction of blood flow towards the liver. Other: None. IMPRESSION: Gallbladder sludge and cholelithiasis. No complicating factors are noted. Electronically Signed   By: Inez Catalina M.D.   On: 10/28/2020 23:34     Medical Consultants:    None.   Subjective:    [name] relates no signs of overt bleeding feels relatively well.  Objective:    Vitals:   10/29/20 0210 10/29/20 0228 10/29/20 0455 10/29/20 0928  BP: (!) 110/53 120/60 118/78 117/67  Pulse: 99 (!) 103 99 100  Resp: 17 16 17 18   Temp: (!) 97.5 F (36.4 C) 98.4 F (36.9 C) 98 F (36.7 C) 98.1 F (36.7 C)  TempSrc: Oral Oral Oral Oral  SpO2: 98% 96% 97% 96%  Weight:      Height:       SpO2: 96 %   Intake/Output Summary (Last 24 hours) at 10/29/2020 1008 Last data filed at 10/29/2020 0949 Gross per 24 hour  Intake 664 ml  Output 825 ml  Net -161 ml   Filed Weights   10/29/20 0125  Weight: 75.7 kg    Exam: General exam: In no acute distress. Respiratory system: Good air movement and clear to auscultation. Cardiovascular system: S1 & S2 heard, RRR. No JVD. Gastrointestinal system: Abdomen is nondistended, soft and nontender.  Extremities: No pedal edema. Skin: No rashes, lesions or ulcers Psychiatry: Judgement and insight appear normal. Mood & affect appropriate.    Data Reviewed:    Labs: Basic Metabolic Panel: Recent Labs  Lab 10/28/20 0952 10/28/20 1806 10/29/20 0734  NA  136 135 139  K 3.8 3.8 3.4*  CL 103 104 108  CO2 27 25 26   GLUCOSE 167* 199* 116*  BUN 16 16 10   CREATININE 0.77 0.87 0.77  CALCIUM 8.4 8.5* 8.0*  MG  --   --  1.8   GFR Estimated Creatinine Clearance: 64.6 mL/min (by C-G formula based on SCr of 0.77 mg/dL). Liver Function Tests: Recent Labs  Lab 10/28/20 0952 10/28/20 1806 10/29/20 0734  AST 14 18 16   ALT 7 11 9   ALKPHOS 48 42 40  BILITOT 0.8 0.9 0.9  PROT 8.2 8.6* 7.8  ALBUMIN 3.4* 3.1* 2.9*   No results for input(s): LIPASE, AMYLASE in the last 168 hours. No results for input(s): AMMONIA in the last 168 hours. Coagulation profile Recent Labs  Lab 10/28/20 1806  INR 1.2   COVID-19 Labs  Recent Labs    10/28/20 0952 10/29/20 0734  FERRITIN  --  27  LDH  --  155  CRP <1.0  --     Lab Results  Component Value Date   SARSCOV2NAA NEGATIVE 10/28/2020   SARSCOV2NAA NEGATIVE 09/06/2019   [place] NEGATIVE 09/04/2019    CBC: Recent Labs  Lab 10/28/20 0952 10/28/20 1806 10/29/20 0734  WBC 9.2 10.5 8.5  NEUTROABS 6.7 6.1 PENDING  HGB 8.9 Repeated and verified X2.* 8.3* 8.1*  HCT 26.8* 27.4* 27.0*  MCV 86.8 93.8 92.5  PLT 4.0 Repeated and verified X2.* <5* <5*   Cardiac Enzymes: No results for input(s): CKTOTAL, CKMB, CKMBINDEX, TROPONINI in the last 168 hours. BNP (last 3 results) No results for input(s): PROBNP in the last 8760 hours. CBG: Recent Labs  Lab 10/29/20 0152 10/29/20 0700  GLUCAP 148* 117*   D-Dimer: No results for input(s): DDIMER in the last 72 hours. Hgb A1c: Recent Labs    10/28/20 0914 10/29/20 0734  HGBA1C 5.7  5.7  5.7  5.7* 5.9*   Lipid Profile: No results for input(s): CHOL, HDL, LDLCALC, TRIG, CHOLHDL, LDLDIRECT in the last 72 hours. Thyroid function studies: Recent Labs    10/28/20 0952  TSH 3.17   Anemia work up: Recent Labs    10/29/20 0734  VITAMINB12 3,903*  FOLATE 10.3  FERRITIN 27  TIBC 311  IRON 47   Sepsis Labs: Recent Labs  Lab  10/28/20 0952 10/28/20 1806 10/29/20 0734  WBC 9.2 10.5 8.5   Microbiology Recent Results (from the past 240 hour(s))  Resp Panel by RT-PCR (Flu A&B, Covid) Nasopharyngeal Swab     Status: None   Collection Time: 10/28/20  6:07 PM   Specimen: Nasopharyngeal Swab; Nasopharyngeal(NP) swabs in vial transport medium  Result Value Ref Range Status   SARS Coronavirus 2 by RT PCR NEGATIVE NEGATIVE Final    Comment: (NOTE) SARS-CoV-2 target nucleic acids are NOT DETECTED.  The SARS-CoV-2 RNA is generally detectable in upper respiratory specimens during the acute phase of infection. The lowest concentration of SARS-CoV-2 viral copies this assay can detect is 138 copies/mL. A negative result does not preclude SARS-Cov-2 infection and should not be used as the sole basis for treatment or other patient management decisions. A negative result may occur with  improper specimen collection/handling, submission of specimen other than nasopharyngeal swab, presence of viral mutation(s) within the areas targeted by this assay, and inadequate number of viral copies(<138 copies/mL). A negative result must be combined with clinical observations, patient history, and epidemiological information. The expected result is Negative.  Fact Sheet for Patients:  EntrepreneurPulse.com.au  Fact Sheet for Healthcare Providers:  IncredibleEmployment.be  This test is no t yet approved or cleared by the [place] FDA and  has been authorized for detection and/or diagnosis of SARS-CoV-2 by FDA under an Emergency Use Authorization (EUA). This EUA will remain  in effect (meaning this test can be used) for the duration of the COVID-19 declaration under Section 564(b)(1) of the Act, 21 U.S.C.section 360bbb-3(b)(1), unless the authorization is terminated  or revoked sooner.       Influenza A by PCR NEGATIVE NEGATIVE Final   Influenza B by PCR NEGATIVE NEGATIVE Final     Comment: (NOTE) The Xpert Xpress SARS-CoV-2/FLU/RSV plus assay is intended as an aid in the diagnosis of influenza from Nasopharyngeal swab specimens and should not be used as a sole basis for treatment. Nasal washings and aspirates are unacceptable for Xpert Xpress SARS-CoV-2/FLU/RSV testing.  Fact Sheet for Patients: EntrepreneurPulse.com.au  Fact Sheet for Healthcare Providers: IncredibleEmployment.be  This test is not yet approved  or cleared by the [place] and has been authorized for detection and/or diagnosis of SARS-CoV-2 by FDA under an Emergency Use Authorization (EUA). This EUA will remain in effect (meaning this test can be used) for the duration of the COVID-19 declaration under Section 564(b)(1) of the Act, 21 U.S.C. section 360bbb-3(b)(1), unless the authorization is terminated or revoked.  Performed at [facility], [place] [address], [place], [place] [address]      Medications:   . acetaminophen  650 mg Oral Once  . cyanocobalamin  1,000 mcg Subcutaneous Daily  . diphenhydrAMINE  25 mg Oral Once  . gabapentin  800 mg Oral TID  . insulin aspart  0-15 Units Subcutaneous TID AC & HS  . metoprolol succinate  50 mg Oral Daily  . mirtazapine  15 mg Oral QHS  . ondansetron (ZOFRAN) IV  4 mg Intravenous Once  . potassium chloride  20 mEq Oral BID  . [START ON 10/30/2020] predniSONE  60 mg Oral Q breakfast   Continuous Infusions: . Immune Globulin 10%        LOS: 0 days   Charlynne Cousins  [facility]  10/29/2020, 10:08 AM

## 2020-10-29 NOTE — Consult Note (Signed)
Chief Complaint: Patient was seen in consultation today for CT guided bone marrow biopsy Chief Complaint  Patient presents with  . Abnormal Lab    Referring Physician(s): [id number],V,XY  Supervising Physician: Daryll Brod  Patient Status: [facility] - [facility]-pt  History of Present Illness: Kathleen Gibson is a 70 y.o. female with past medical history significant for diabetes, hyperlipidemia, hypertension, ITP initially diagnosed in 2014, gastric bypass surgery, pernicious anemia/vitamin B12 deficiency and nonalcoholic fatty liver disease.  Recently admitted to [facility] with severe thrombocytopenia along with general malaise, weakness, poor appetite, myalgias, and occasional epistaxis.  She has also had prior history of lymphadenopathy on past CT but patient failed to follow-up for further evaluation.  Current labs include WBC 8.5, hemoglobin 8.1, platelets less than 5k ;request now received from oncology for CT-guided bone marrow biopsy for further evaluation.  Past Medical History:  Diagnosis Date  . Diabetes ([place])   . HLD (hyperlipidemia)   . Hypertension   . Pernicious anemia   . Pneumonia 09/05/2019  . Renal insufficiency 09/06/2019  . Vitamin D deficiency     Past Surgical History:  Procedure Laterality Date  . ABDOMINAL HERNIA REPAIR  2009  . ABDOMINOPLASTY    . [place]  . GASTRIC BYPASS  2000  . hemorroid surgery  2007  . TONSILLECTOMY AND ADENOIDECTOMY  1957    Allergies: Asa [aspirin] and Nsaids  Medications: Prior to Admission medications   Medication Sig Start Date End Date Taking? Authorizing Provider  Cyanocobalamin (B-12) 1000 MCG SUBL Place 1 tablet under the tongue daily. 10/28/20   Kuneff, Renee A, DO  ferrous sulfate 325 (65 FE) MG tablet Take 1 tablet (325 mg total) by mouth daily with breakfast. 10/28/20   Kuneff, Renee A, DO  gabapentin (NEURONTIN) 300 MG capsule 900 my QHS and 300 mg every 8 hrs in day 10/28/20   Kuneff, Renee A, DO  glucose  monitoring kit (FREESTYLE) monitoring kit 1 each by Does not apply route as needed for other. Use as directed 02/24/14   Lucille Passy, MD  glyBURIDE (DIABETA) 2.5 MG tablet Take 2.5 mg by mouth daily. 10/28/20   [provider]  metoprolol succinate (TOPROL-XL) 50 MG 24 hr tablet Take 1 tablet (50 mg total) by mouth daily. 10/28/20   Kuneff, Renee A, DO  mirtazapine (REMERON SOL-TAB) 15 MG disintegrating tablet Take 1 tablet (15 mg total) by mouth at bedtime. 10/28/20   Kuneff, Renee A, DO  Omega-3 Fatty Acids (FISH OIL) 1000 MG CAPS Take 1,000 mg by mouth daily.     [provider]  sitaGLIPtin-metformin (JANUMET) 50-1000 MG tablet Take 1 tablet by mouth at bedtime. 10/28/20   Kuneff, Renee A, DO  Vitamin D, Cholecalciferol, 25 MCG (1000 UT) CAPS Take 1,000 Units by mouth daily.     [provider]  zolpidem (AMBIEN) 5 MG tablet Take 1 tablet (5 mg total) by mouth at bedtime. 10/09/20   Howard Pouch A, DO     Family History  Problem Relation Age of Onset  . Cancer Mother 37       unknown type cancer   . Hypertension Mother   . Hypertension Father   . Heart failure Father   . Pneumonia Father   . Diabetes Sister   . Leukemia Brother   . Colon cancer Brother 38  . Heart Problems Brother   . Leukemia Other   . Diabetes Sister   . Heart Problems Sister   .  Cancer Brother   . Heart Problems Brother     Social History   Socioeconomic History  . Marital status: Divorced    Spouse name: Not on file  . Number of children: 3  . Years of education: 3  . Highest education level: Not on file  Occupational History  . Occupation: Retired  Tobacco Use  . Smoking status: Never Smoker  . Smokeless tobacco: Never Used  Vaping Use  . Vaping Use: Never used  Substance and Sexual Activity  . Alcohol use: No  . Drug use: No  . Sexual activity: Not on file  Other Topics Concern  . Not on file  Social History Narrative   Divorced. Retired Pension scheme manager.    12th grade education.   Drinks caffeine.   Smoke alarm in the home. Wears her seatbelt.   Feels safe in her relationships.   Two story home   Right handed    Social Determinants of Health   Financial Resource Strain: Not on file  Food Insecurity: Not on file  Transportation Needs: Not on file  Physical Activity: Not on file  Stress: Not on file  Social Connections: Not on file      Review of Systems denies fever, headache, chest pain, dyspnea, cough, abdominal/back pain, nausea, vomiting; she does bruise easily and has fatigue  Vital Signs: BP 117/67 (BP Location: Right Arm)   Pulse 100   Temp 98.1 F (36.7 C) (Oral)   Resp 18   Ht _0  (1.6 m)   Wt 166 lb 14.2 oz (75.7 kg)   SpO2 96%   BMI 29.56 kg/m   Physical Exam awake, alert.  Chest with slightly diminished breath sounds right base, left clear.  Heart with regular rate and rhythm.  Abdomen soft, positive bowel sounds, nontender.  No lower extremity edema.  Scattered ecchymoses noted.  Imaging: US Abdomen Limited RUQ (LIVER/GB)  Result Date: 10/28/2020 CLINICAL DATA:  Follow-up cirrhosis EXAM: ULTRASOUND ABDOMEN LIMITED RIGHT UPPER QUADRANT COMPARISON:  09/05/2019 FINDINGS: Gallbladder: Gallbladder is well distended with gallbladder sludge within. Previously seen calculi are noted within the dependent sludge. No wall thickening or pericholecystic fluid is noted. Negative sonographic Murphy's sign is elicited. Common bile duct: Diameter: 2.3 mm. Liver: No focal lesion identified. Within normal limits in parenchymal echogenicity. Portal vein is patent on color Doppler imaging with normal direction of blood flow towards the liver. Other: None. IMPRESSION: Gallbladder sludge and cholelithiasis. No complicating factors are noted. Electronically Signed   By: Inez Catalina M.D.   On: 10/28/2020 23:34    Labs:  CBC: Recent Labs    05/22/20 0841 10/28/20 0952 10/28/20 1806 10/29/20 0734  WBC 7.9 9.2 10.5 8.5  HGB 11.1*  8.9 Repeated and verified X2.* 8.3* 8.1*  HCT 33.3* 26.8* 27.4* 27.0*  PLT 48.0* 4.0 Repeated and verified X2.* <5* <5*    COAGS: Recent Labs    10/28/20 1806  INR 1.2    BMP: Recent Labs    01/08/20 0857 10/28/20 0952 10/28/20 1806 10/29/20 0734  NA 138 136 135 139  K 4.3 3.8 3.8 3.4*  CL 101 103 104 108  CO2 33* _1 GLUCOSE 138* 167* 199* 116*  BUN _2 CALCIUM 9.3 8.4 8.5* 8.0*  CREATININE 0.81 0.77 0.87 0.77  GFRNONAA  --   --  >60 >60    LIVER FUNCTION TESTS: Recent Labs    10/28/20 0952 10/28/20 1806 10/29/20 0734  BILITOT 0.8 0.9  0.9  AST _0 ALT _1 ALKPHOS 48 42 40  PROT 8.2 8.6* 7.8  ALBUMIN 3.4* 3.1* 2.9*    TUMOR MARKERS: No results for input(s): AFPTM, CEA, CA199, CHROMGRNA in the last 8760 hours.  Assessment and Plan: 70 yo. female with past medical history significant for diabetes, hyperlipidemia, hypertension, ITP initially diagnosed in 2014, gastric bypass surgery, pernicious anemia/vitamin B12 deficiency and nonalcoholic fatty liver disease.  Recently admitted to [facility] with severe thrombocytopenia along with general malaise, weakness, poor appetite, myalgias, and occasional epistaxis.  She has also had prior history of lymphadenopathy on past CT but patient failed to follow-up for further evaluation.  Current labs include WBC 8.5, hemoglobin 8.1, platelets less than 5k ;request now received from oncology for CT-guided bone marrow biopsy for further evaluation.Risks and benefits of procedure was discussed with the patient  including, but not limited to bleeding, infection, damage to adjacent structures or low yield requiring additional tests.  All of the questions were answered and there is agreement to proceed.  Consent signed and in chart.  Procedure scheduled for 5/20 am   Thank you for this interesting consult.  I greatly enjoyed meeting Kathleen Gibson and look forward to participating in their care.  A copy of  this report was sent to the requesting provider on this date.  Electronically Signed: D. Rowe Robert, PA-C 10/29/2020, 2:34 PM   I spent a total of 20 minutes    in face to face in clinical consultation, greater than 50% of which was counseling/coordinating care for CT guided bone marrow biopsy

## 2020-10-29 NOTE — Progress Notes (Signed)
New Admission Note:   Arrival Method: Arrived from [facility] ED via stretcher Mental Orientation: Alert and oriented x4 Telemetry: Box #18 Assessment: Completed Skin: Bruising on arms,abdomen,buttocks,knee and legs IV: NSL-Rt FA Pain: 0/10 Tubes: N/A Safety Measures: Safety Fall Prevention Plan has been discussed.  Admission: Completed 5MW Orientation: Patient has been oriented to the room, unit and staff.  Family:None at the bedside  Orders have been reviewed and implemented. Will continue to monitor the patient. Call light has been placed within reach and bed alarm has been activated.   Kortney Potvin [employer], RN-BC Phone number: [phone]

## 2020-10-30 ENCOUNTER — Inpatient Hospital Stay (HOSPITAL_COMMUNITY): Payer: Medicare HMO

## 2020-10-30 DIAGNOSIS — D693 Immune thrombocytopenic purpura: Secondary | ICD-10-CM | POA: Diagnosis not present

## 2020-10-30 DIAGNOSIS — D649 Anemia, unspecified: Secondary | ICD-10-CM | POA: Diagnosis not present

## 2020-10-30 DIAGNOSIS — I10 Essential (primary) hypertension: Secondary | ICD-10-CM | POA: Diagnosis not present

## 2020-10-30 DIAGNOSIS — E1142 Type 2 diabetes mellitus with diabetic polyneuropathy: Secondary | ICD-10-CM | POA: Diagnosis not present

## 2020-10-30 LAB — CBC WITH DIFFERENTIAL/PLATELET
Abs Immature Granulocytes: 1.45 10*3/uL — ABNORMAL HIGH (ref 0.00–0.07)
Basophils Absolute: 0 10*3/uL (ref 0.0–0.1)
Basophils Relative: 0 %
Eosinophils Absolute: 0 10*3/uL (ref 0.0–0.5)
Eosinophils Relative: 0 %
HCT: 26.3 % — ABNORMAL LOW (ref 36.0–46.0)
Hemoglobin: 7.6 g/dL — ABNORMAL LOW (ref 12.0–15.0)
Immature Granulocytes: 15 %
Lymphocytes Relative: 11 %
Lymphs Abs: 1.1 10*3/uL (ref 0.7–4.0)
MCH: 27.7 pg (ref 26.0–34.0)
MCHC: 28.9 g/dL — ABNORMAL LOW (ref 30.0–36.0)
MCV: 96 fL (ref 80.0–100.0)
Monocytes Absolute: 2.1 10*3/uL — ABNORMAL HIGH (ref 0.1–1.0)
Monocytes Relative: 21 %
Neutro Abs: 5.2 10*3/uL (ref 1.7–7.7)
Neutrophils Relative %: 53 %
Platelets: <5 10*3/uL — CL (ref 150–400)
RBC: 2.74 MIL/uL — ABNORMAL LOW (ref 3.87–5.11)
RDW: 18.8 % — ABNORMAL HIGH (ref 11.5–15.5)
WBC: 10 10*3/uL (ref 4.0–10.5)
nRBC: 0.2 % (ref 0.0–0.2)

## 2020-10-30 LAB — PREPARE PLATELET PHERESIS: Unit division: 0

## 2020-10-30 LAB — IRON,TIBC AND FERRITIN PANEL
%SAT: 17 % (calc) (ref 16–45)
Ferritin: 29 ng/mL (ref 16–288)
Iron: 52 ug/dL (ref 45–160)
TIBC: 305 mcg/dL (calc) (ref 250–450)

## 2020-10-30 LAB — GLUCOSE, CAPILLARY
Glucose-Capillary: 130 mg/dL — ABNORMAL HIGH (ref 70–99)
Glucose-Capillary: 105 mg/dL — ABNORMAL HIGH (ref 70–99)
Glucose-Capillary: 227 mg/dL — ABNORMAL HIGH (ref 70–99)
Glucose-Capillary: 344 mg/dL — ABNORMAL HIGH (ref 70–99)

## 2020-10-30 LAB — BPAM PLATELET PHERESIS
Blood Product Expiration Date: 202205222359
ISSUE DATE / TIME: 202205190209
Unit Type and Rh: 5100

## 2020-10-30 LAB — LACTATE DEHYDROGENASE: LDH: 165 U/L (ref 120–250)

## 2020-10-30 LAB — HAPTOGLOBIN: Haptoglobin: 46 mg/dL (ref 37–355)

## 2020-10-30 MED ORDER — LIDOCAINE HCL 1 % IJ SOLN
INTRAMUSCULAR | Status: AC
  Filled 2020-10-30: qty 40

## 2020-10-30 MED ORDER — MIDAZOLAM HCL 2 MG/2ML IJ SOLN
INTRAMUSCULAR | Status: AC
  Filled 2020-10-30: qty 6

## 2020-10-30 MED ORDER — FENTANYL CITRATE (PF) 100 MCG/2ML IJ SOLN
INTRAMUSCULAR | Status: AC
  Filled 2020-10-30: qty 4

## 2020-10-30 MED ORDER — FENTANYL CITRATE (PF) 100 MCG/2ML IJ SOLN
INTRAMUSCULAR | Status: AC | PRN
  Administered 2020-10-30: 25 ug via INTRAVENOUS
  Administered 2020-10-30 (×2): 50 ug via INTRAVENOUS

## 2020-10-30 MED ORDER — MIDAZOLAM HCL 2 MG/2ML IJ SOLN
INTRAMUSCULAR | Status: AC | PRN
  Administered 2020-10-30 (×2): 0.5 mg via INTRAVENOUS
  Administered 2020-10-30 (×2): 1 mg via INTRAVENOUS

## 2020-10-30 MED ORDER — ROMIPLOSTIM 250 MCG ~~LOC~~ SOLR
2.0000 ug/kg | Freq: Once | SUBCUTANEOUS | Status: AC
Start: 2020-10-30 — End: 2020-10-30
  Administered 2020-10-30: 150 ug via SUBCUTANEOUS
  Filled 2020-10-30: qty 0.3

## 2020-10-30 MED ORDER — SODIUM CHLORIDE 0.9% IV SOLUTION: Freq: Once | INTRAVENOUS | Status: AC

## 2020-10-30 NOTE — Progress Notes (Addendum)
HEMATOLOGY-ONCOLOGY PROGRESS NOTE  SUBJECTIVE: The patient had an ultrasound-guided biopsy of her right axillary lymph node earlier today and a bone marrow biopsy by IR.  She tolerated both procedures well.  She reported having a very small amount of bleeding at the site of her right axilla lymph node biopsy.  This has now stopped.  A unit of platelets is currently infusing.  She has not noticed any other bleeding.  REVIEW OF SYSTEMS:   Constitutional: Denies fevers, chills Eyes: Denies blurriness of vision Ears, nose, mouth, throat, and face: Denies mucositis or sore throat Respiratory: Denies cough, dyspnea or wheezes Cardiovascular: Denies palpitation, chest discomfort Gastrointestinal:  Denies nausea, heartburn or change in bowel habits Skin: Reports easy bruising Lymphatics: Notes lymphadenopathy in her neck and onto her arms Neurological:Denies numbness, tingling or new weaknesses Behavioral/Psych: Mood is stable, no new changes  Extremities: No lower extremity edema All other systems were reviewed with the patient and are negative.  I have reviewed the past medical history, past surgical history, social history and family history with the patient and they are unchanged from previous note.   PHYSICAL EXAMINATION: ECOG PERFORMANCE STATUS: 2 - Symptomatic, <50% confined to bed  Vitals:   10/30/20 1137 10/30/20 1155  BP: 100/60 (!) 98/51  Pulse: 95 90  Resp: 18 18  Temp: 98.3 F (36.8 C) 98.6 F (37 C)  SpO2: 93% 96%   Filed Weights   10/29/20 0125  Weight: 75.7 kg    Intake/Output from previous day: 05/19 0701 - 05/20 0700 In: 1830 [P.O.:1080; I.V.:750] Out: 7619 [Urine:3175]  GENERAL:alert, no distress and comfortable SKIN: Multiple ecchymoses, faint petechiae on her legs, arms, chest EYES: normal, Conjunctiva are pink and non-injected, sclera clear LYMPH: Palpable cervical, supraclavicular, axillary, no inguinal lymphadenopathy LUNGS: clear to auscultation and  percussion with normal breathing effort HEART: regular rate & rhythm and no murmurs and no lower extremity edema ABDOMEN:abdomen soft, non-tender and normal bowel sounds NEURO: alert & oriented x 3 with fluent speech, no focal motor/sensory deficits  LABORATORY DATA:  I have reviewed the data as listed CMP Latest Ref Rng & Units 10/29/2020 10/28/2020 10/28/2020  Glucose 70 - 99 mg/dL 116(H) 199(H) 167(H)  BUN 8 - 23 mg/dL _0 Creatinine 0.44 - 1.00 mg/dL 0.77 0.87 0.77  Sodium 135 - 145 mmol/L 139 135 136  Potassium 3.5 - 5.1 mmol/L 3.4(L) 3.8 3.8  Chloride 98 - 111 mmol/L 108 104 103  CO2 22 - 32 mmol/L _1 Calcium 8.9 - 10.3 mg/dL 8.0(L) 8.5(L) 8.4  Total Protein 6.5 - 8.1 g/dL 7.8 8.6(H) 8.2  Total Bilirubin 0.3 - 1.2 mg/dL 0.9 0.9 0.8  Alkaline Phos 38 - 126 U/L 40 42 48  AST 15 - 41 U/L _2 ALT 0 - 44 U/L _3 Lab Results  Component Value Date   WBC 10.0 10/30/2020   HGB 7.6 (L) 10/30/2020   HCT 26.3 (L) 10/30/2020   MCV 96.0 10/30/2020   PLT <5 (LL) 10/30/2020   NEUTROABS 5.2 10/30/2020    CT ABDOMEN PELVIS WO CONTRAST  Result Date: 10/29/2020 CLINICAL DATA:  Hematologic malignancy, staging. EXAM: CT CHEST, ABDOMEN AND PELVIS WITHOUT CONTRAST TECHNIQUE: Multidetector CT imaging of the chest, abdomen and pelvis was performed following the standard protocol without IV contrast. COMPARISON:  History of MRI diarrhea neck no palpable status chest left at [facility] 7 right-sided than she thickened change ir indications sick concern for insert  at the right 6 arm. FINDINGS: CT CHEST FINDINGS Cardiovascular: The aortic and branch vessel atherosclerosis without aneurysmal dilation. Normal size heart. No significant pericardial effusion/thickening. Mediastinum/Nodes: Enlarged supraclavicular, bilateral axillary and left mammary lymph nodes. For reference: Left internal mammary lymph node measures 1.9 by 1.2 cm on image 52/7. Right axillary lymph node measures 3.1 x 2.3  cm on image 36/7. Prominent mediastinal lymph nodes measuring up to 9 mm in short axis on image 39/7, without adenopathy by size criteria. Thyroid is grossly unremarkable. Trachea esophagus are grossly unremarkable. Lungs/Pleura: Right lower lobe calcified granuloma. Bibasilar atelectasis. Scattered bilateral micro nodules measuring 1-2 mm for instance on image 96/4 in the left lower lobe and image 72/4 in the right upper lobe. Musculoskeletal: Peripherally calcified nodule in the right axilla on image 22/3. No acute osseous abnormality. CT ABDOMEN PELVIS FINDINGS Hepatobiliary: Grossly unremarkable noncontrast appearance of the hepatic parenchyma. No hepatomegaly. Gallbladder is distended with some layering hyperdense material representing sludge and tiny stones seen on prior ultrasound. No biliary ductal dilation. Pancreas: Within normal limits. Spleen: Splenomegaly measuring 22 cm in maximum craniocaudal dimension. Adrenals/Urinary Tract: Adrenal glands are unremarkable. Kidneys are normal, without renal calculi, contour deforming lesion, or hydronephrosis. Bladder is unremarkable. Stomach/Bowel: Prior gastric bypass surgery. Enteric contrast visualized to the level of the distal small bowel. No pathologic dilation of small bowel. Appendix is grossly unremarkable. Colonic diverticulosis without findings of acute diverticulitis. Vascular/Lymphatic: Aortic atherosclerosis without aneurysmal dilation. No significant interval change in the enlarged and prominent upper abdominal, retroperitoneal and mesenteric lymph nodes. Interval increase in size of the few enlarged and prominent pelvic lymph nodes. Reference lymph nodes are as follows: Periportal lymph node measuring 3.3 x 1.7 cm on image 54/3, unchanged. Aortocaval lymph node measures 1.6 x 1.5 cm on image 69/3, unchanged. Right pelvic sidewall lymph node measures 3.1 x 1.6 cm on image 109/3. Reproductive: Uterus and bilateral adnexa are unremarkable. Other: Small  volume abdominopelvic ascites with diffuse mesenteric stranding. Multiple peritoneal calcifications are again visualized and unchanged from prior, likely sequela prior inflammation. Similar appearance of the nonspecific anterior abdominal wall subcutaneous calcifications. Mild diffuse subcutaneous edema. Musculoskeletal: Multilevel degenerative changes spine. No acute osseous abnormality. IMPRESSION: 1. Splenomegaly with pathologically enlarged lymph nodes above and below the diaphragm, with overall stable to minimally increased abdominal adenopathy and interval progression of the pelvic adenopathy. 2. Small volume abdominopelvic ascites with diffuse mesenteric stranding. 3. Scattered bilateral pulmonary micro nodules measuring 1-2 mm. 4. Distended gallbladder with some layering hyperdense material representing layering sludge and tiny stones seen on prior ultrasound. 5. Aortic atherosclerosis. Aortic Atherosclerosis (ICD10-I70.0). Electronically Signed   By: Dahlia Bailiff MD   On: 10/29/2020 19:53   CT CHEST WO CONTRAST  Result Date: 10/29/2020 CLINICAL DATA:  Hematologic malignancy, staging. EXAM: CT CHEST, ABDOMEN AND PELVIS WITHOUT CONTRAST TECHNIQUE: Multidetector CT imaging of the chest, abdomen and pelvis was performed following the standard protocol without IV contrast. COMPARISON:  History of MRI diarrhea neck no palpable status chest left at [facility] 7 right-sided than she thickened change ir indications sick concern for insert at the right 6 arm. FINDINGS: CT CHEST FINDINGS Cardiovascular: The aortic and branch vessel atherosclerosis without aneurysmal dilation. Normal size heart. No significant pericardial effusion/thickening. Mediastinum/Nodes: Enlarged supraclavicular, bilateral axillary and left mammary lymph nodes. For reference: Left internal mammary lymph node measures 1.9 by 1.2 cm on image 52/7. Right axillary lymph node measures 3.1 x 2.3 cm on image 36/7. Prominent mediastinal lymph nodes  measuring up to 9  mm in short axis on image 39/7, without adenopathy by size criteria. Thyroid is grossly unremarkable. Trachea esophagus are grossly unremarkable. Lungs/Pleura: Right lower lobe calcified granuloma. Bibasilar atelectasis. Scattered bilateral micro nodules measuring 1-2 mm for instance on image 96/4 in the left lower lobe and image 72/4 in the right upper lobe. Musculoskeletal: Peripherally calcified nodule in the right axilla on image 22/3. No acute osseous abnormality. CT ABDOMEN PELVIS FINDINGS Hepatobiliary: Grossly unremarkable noncontrast appearance of the hepatic parenchyma. No hepatomegaly. Gallbladder is distended with some layering hyperdense material representing sludge and tiny stones seen on prior ultrasound. No biliary ductal dilation. Pancreas: Within normal limits. Spleen: Splenomegaly measuring 22 cm in maximum craniocaudal dimension. Adrenals/Urinary Tract: Adrenal glands are unremarkable. Kidneys are normal, without renal calculi, contour deforming lesion, or hydronephrosis. Bladder is unremarkable. Stomach/Bowel: Prior gastric bypass surgery. Enteric contrast visualized to the level of the distal small bowel. No pathologic dilation of small bowel. Appendix is grossly unremarkable. Colonic diverticulosis without findings of acute diverticulitis. Vascular/Lymphatic: Aortic atherosclerosis without aneurysmal dilation. No significant interval change in the enlarged and prominent upper abdominal, retroperitoneal and mesenteric lymph nodes. Interval increase in size of the few enlarged and prominent pelvic lymph nodes. Reference lymph nodes are as follows: Periportal lymph node measuring 3.3 x 1.7 cm on image 54/3, unchanged. Aortocaval lymph node measures 1.6 x 1.5 cm on image 69/3, unchanged. Right pelvic sidewall lymph node measures 3.1 x 1.6 cm on image 109/3. Reproductive: Uterus and bilateral adnexa are unremarkable. Other: Small volume abdominopelvic ascites with diffuse  mesenteric stranding. Multiple peritoneal calcifications are again visualized and unchanged from prior, likely sequela prior inflammation. Similar appearance of the nonspecific anterior abdominal wall subcutaneous calcifications. Mild diffuse subcutaneous edema. Musculoskeletal: Multilevel degenerative changes spine. No acute osseous abnormality. IMPRESSION: 1. Splenomegaly with pathologically enlarged lymph nodes above and below the diaphragm, with overall stable to minimally increased abdominal adenopathy and interval progression of the pelvic adenopathy. 2. Small volume abdominopelvic ascites with diffuse mesenteric stranding. 3. Scattered bilateral pulmonary micro nodules measuring 1-2 mm. 4. Distended gallbladder with some layering hyperdense material representing layering sludge and tiny stones seen on prior ultrasound. 5. Aortic atherosclerosis. Aortic Atherosclerosis (ICD10-I70.0). Electronically Signed   By: Dahlia Bailiff MD   On: 10/29/2020 19:53   CT BIOPSY  Result Date: 10/30/2020 INDICATION: 70 year old female presents for ultrasound-guided right axillary node biopsy EXAM: ULTRASOUND-GUIDED RIGHT AXILLARY NODE BIOPSY MEDICATIONS: None. ANESTHESIA/SEDATION: Moderate (conscious) sedation was employed during this procedure, as the same moderate sedation that was performed during the accession number [id number]. Same sedation time for sequential procedures. A total of Versed 3.0 mg and Fentanyl 125 mcg was administered intravenously. Moderate Sedation Time: 42 minutes. The patient's level of consciousness and vital signs were monitored continuously by radiology nursing throughout the procedure under my direct supervision. FLUOROSCOPY TIME:  ULTRASOUND COMPLICATIONS: NONE PROCEDURE: Informed written consent was obtained from the patient after a thorough discussion of the procedural risks, benefits and alternatives. All questions were addressed. Maximal Sterile Barrier Technique was utilized including  caps, mask, sterile gowns, sterile gloves, sterile drape, hand hygiene and skin antiseptic. A timeout was performed prior to the initiation of the procedure. After a bone marrow biopsy was performed under CT guidance, the patient was turned into the supine position on her stretcher. Ultrasound images of the right axillary region were performed with images stored. The right axillary region was prepped with chlorhexidine in a sterile fashion, and a sterile drape was applied covering the operative field. A sterile gown  and sterile gloves were used for the procedure. Local anesthesia was provided with 1% Lidocaine. Ultrasound guidance was used to infiltrate the region with 1% lidocaine for local anesthesia. Five separate 18 gauge core biopsy were then acquired of the right axillary node using ultrasound guidance. Specimens placed into fresh specimen/saline. Patient tolerated the procedure well and remained hemodynamically stable throughout. No complications were encountered and no significant blood loss was encounter IMPRESSION: Status post ultrasound-guided biopsy of right axillary lymph node. Signed, Dulcy Fanny. Dellia Nims, RPVI [facility] Radiology Electronically Signed   By: Corrie Mckusick D.O.   On: 10/30/2020 11:05   CT BONE MARROW BIOPSY & ASPIRATION  Result Date: 10/30/2020 INDICATION: 70 year old female referred for bone marrow biopsy EXAM: CT BONE MARROW BIOPSY AND ASPIRATION MEDICATIONS: None. ANESTHESIA/SEDATION: Moderate (conscious) sedation was employed during this procedure. A total of Versed 3.0 mg and Fentanyl 125 mcg was administered intravenously. Moderate Sedation Time: 42 minutes. The patient's level of consciousness and vital signs were monitored continuously by radiology nursing throughout the procedure under my direct supervision. FLUOROSCOPY TIME:  CT COMPLICATIONS: None PROCEDURE: The procedure risks, benefits, and alternatives were explained to  the patient. Questions regarding the procedure were encouraged and answered. The patient understands and consents to the procedure. Scout CT of the pelvis was performed for surgical planning purposes. The posterior pelvis was prepped with Chlorhexidine in a sterile fashion, and a sterile drape was applied covering the operative field. A sterile gown and sterile gloves were used for the procedure. Local anesthesia was provided with 1% Lidocaine. Posterior right iliac bone was targeted for biopsy. The skin and subcutaneous tissues were infiltrated with 1% lidocaine without epinephrine. A small stab incision was made with an 11 blade scalpel, and an 11 gauge Murphy needle was advanced with CT guidance to the posterior cortex. Manual forced was used to advance the needle through the posterior cortex and the stylet was removed. A bone marrow aspirate was retrieved and passed to a cytotechnologist in the room. The Murphy needle was then advanced without the stylet for a core biopsy. The core biopsy was retrieved and also passed to a cytotechnologist. Manual pressure was used for hemostasis and a sterile dressing was placed. No complications were encountered no significant blood loss was encountered. Patient tolerated the procedure well and remained hemodynamically stable throughout. After the bone marrow biopsy was completed, the patient was then positioned supine on her stretcher and we proceeded with the next staff of the planned case, a right axillary lymph node ultrasound-guided biopsy. The same sedation time is applied for both procedures. IMPRESSION: Status post CT-guided bone marrow biopsy, with tissue specimen sent to pathology for complete histopathologic analysis Signed, Dulcy Fanny. Earleen Newport, DO [facility] Radiology Electronically Signed   By: Corrie Mckusick D.O.   On: 10/30/2020 11:02   US Abdomen Limited RUQ (LIVER/GB)  Result Date: 10/28/2020 CLINICAL DATA:   Follow-up cirrhosis EXAM: ULTRASOUND ABDOMEN LIMITED RIGHT UPPER QUADRANT COMPARISON:  09/05/2019 FINDINGS: Gallbladder: Gallbladder is well distended with gallbladder sludge within. Previously seen calculi are noted within the dependent sludge. No wall thickening or pericholecystic fluid is noted. Negative sonographic Murphy's sign is elicited. Common bile duct: Diameter: 2.3 mm. Liver: No focal lesion identified. Within normal limits in parenchymal echogenicity. Portal vein is patent on color Doppler imaging with normal direction of blood flow towards the liver. Other: None. IMPRESSION: Gallbladder sludge and cholelithiasis. No complicating factors are noted. Electronically Signed  By: Inez Catalina M.D.   On: 10/28/2020 23:34    ASSESSMENT AND PLAN: Ms. Feria is a 70 year old female with history of ITP and lymphadenopathy who presented to the hospital with severe thrombocytopenia and anemia.  1. Severe thrombocytopenia 2. Normocytic anemia 3. Lymphadenopathy and splenomegaly concerning for neoplastic process 4. Type 2 diabetes mellitus with polyneuropathy 5.  Hypertension  -The patient underwent an ultrasound-guided biopsy of her right axillary lymph node and bone marrow biopsy earlier today.  She tolerated both procedures well and results are currently pending. -Status post IVIG 1 g/kg x 2 doses and remains on prednisone 60 mg daily.  Continue prednisone 60 mg daily. -We will consider addition of Nplate. -Transfuse platelets for active bleeding.   LOS: 1 day   Mikey Bussing, DNP, AGPCNP-BC, AOCNP 10/30/20  Addendum  I have seen the patient, examined her. I agree with the assessment and and plan and have edited the notes.   Pt had BM and right axillary node biopsy by IR today. I have reviewed her CT scan images. I spoke with pathologist Dr. Gari Crown this afternoon. The BM aspirates showed no leukemia or lymphoma cells, adequate megakaryocytes with mild dyspoisis. This is very preliminary  result, core bone marrow biopsy will be reviewed on Monday. Pt received 2u plt transfusion today for biopsy. She is getting her second dose IVIG today. Will continue prednisone at current dose, I will start Nplate today, at 47mg/kg, due to her severe thrombocytopenia. Will continue f/u clsoely. My on-call partner Dr. KIrene Limbowill see her tomorrow.  YTruitt Merle 10/30/2020 6:55 PM

## 2020-10-30 NOTE — Progress Notes (Signed)
[facility] PROGRESS NOTE    Progress Note  Kathleen Gibson  DUS:[id number] DOB: 12/28/1950 DOA: 10/28/2020 PCP: Natalia Leatherwood, DO     Brief Narrative:   Kathleen Gibson is an 70 y.o. female past medical history of diabetes mellitus type 2, diabetic neuropathy, ITP diagnosed in 2014 and followed by Dr. Parke Poisson as an outpatient, medical noncompliance, gastric bypass in 2000 essential hypertension pernicious anemia due to B12 deficiency nonalcoholic fatty liver disease comes into the [facility] due to severe thrombocytopenia, she relates she about 8 weeks ago she and her family got sick and since then she has been feeling down.  She relates poor appetite and severe malaise started developing some nosebleeds which was able to control with compression maneuvers.  Her platelet count in the ED was 5 she is transfuse 1 unit of packed platelets hematology was consulted.    Significant Events: 10/28/2020 1 unit of packed platelets  Significant studies: 10/28/2020 abdominal ultrasound gallbladder sludge no acute cholecystitis  Antibiotics: None   Assessment/Plan:   Acute thrombocytopenia: Unremarkable LDH and bilirubin.Coombs test was positive for IgG, but oncology doubts she has significant hemolytic anemia. They recommended to transfuse if patient bleeding.  She is getting a biopsy today for bone marrow we will go ahead and give her a unit of platelets. She has been started on IVIG and steroids for her severe thrombocytopenia. If no improvement might need Nplate.  Might require several days before responding. The site of her bone marrow does not have a hematoma we will continue to observe closely ready looking to the nurse.  Diffuse lymphadenopathy: Concerning for lymphoma, oncology recommended a bone marrow biopsy. IR was consulted and performed a bone marrow biopsy on 10/30/2020. Has a small hematoma in the biopsy site we will keep a close eye check a CBC tomorrow morning.  Type 2  diabetes mellitus with diabetic polyneuropathy, without long-term current use of insulin  Hold home oral hypoglycemic agents. A1C 5.9.  She is currently on sliding scale insulin meal coverage blood glucose fairly controlled.  Diabetic polyneuropathy associated with type 2 diabetes mellitus (HCC) Continue gabapentin.  Essential hypertension Continue metoprolol.  B12 deficiency Her B12 is 4000 we will hold on on repletion    History of noncompliance with medical treatment She has been counseled.  She has been counseled  Abdominal lymphadenopathy Further work-up as an outpatient.  Normocytic anemia Slight drop in hemoglobin compared to previous months ago, she relates no significant bleeding. Her B12 was 4000 continue folate repletion. Anemia panel showed ferritin of 27 we will start her on oral repletion as an outpatient.    DVT prophylaxis: SCD Family Communication:none Status is: Observation  The patient will require care spanning > 2 midnights and should be moved to inpatient because: Hemodynamically unstable  Dispo: The patient is from: Home              Anticipated d/c is to: Home              Patient currently is not medically stable to d/c.   Difficult to place patient No    Code Status:     Code Status Orders  (From admission, onward)         Start     Ordered   10/28/20 2337  Do not attempt resuscitation (DNR)  Continuous       Question Answer Comment  In the event of cardiac or respiratory ARREST Do not call a "code blue"  In the event of cardiac or respiratory ARREST Do not perform Intubation, CPR, defibrillation or ACLS   In the event of cardiac or respiratory ARREST Use medication by any route, position, wound care, and other measures to relive pain and suffering. May use oxygen, suction and manual treatment of airway obstruction as needed for comfort.      10/28/20 2336        Code Status History    Date Active Date Inactive Code Status Order ID  Comments User Context   09/06/2019 0407 09/07/2019 1751 Full Code [id number]  [name], MD Inpatient   Advance Care Planning Activity        IV Access:    Peripheral IV   Procedures and diagnostic studies:   CT ABDOMEN PELVIS WO CONTRAST  Result Date: 10/29/2020 CLINICAL DATA:  Hematologic malignancy, staging. EXAM: CT CHEST, ABDOMEN AND PELVIS WITHOUT CONTRAST TECHNIQUE: Multidetector CT imaging of the chest, abdomen and pelvis was performed following the standard protocol without IV contrast. COMPARISON:  History of MRI diarrhea neck no palpable status chest left at [facility] 7 right-sided than she thickened change ir indications sick concern for insert at the right 6 arm. FINDINGS: CT CHEST FINDINGS Cardiovascular: The aortic and branch vessel atherosclerosis without aneurysmal dilation. Normal size heart. No significant pericardial effusion/thickening. Mediastinum/Nodes: Enlarged supraclavicular, bilateral axillary and left mammary lymph nodes. For reference: Left internal mammary lymph node measures 1.9 by 1.2 cm on image 52/7. Right axillary lymph node measures 3.1 x 2.3 cm on image 36/7. Prominent mediastinal lymph nodes measuring up to 9 mm in short axis on image 39/7, without adenopathy by size criteria. Thyroid is grossly unremarkable. Trachea esophagus are grossly unremarkable. Lungs/Pleura: Right lower lobe calcified granuloma. Bibasilar atelectasis. Scattered bilateral micro nodules measuring 1-2 mm for instance on image 96/4 in the left lower lobe and image 72/4 in the right upper lobe. Musculoskeletal: Peripherally calcified nodule in the right axilla on image 22/3. No acute osseous abnormality. CT ABDOMEN PELVIS FINDINGS Hepatobiliary: Grossly unremarkable noncontrast appearance of the hepatic parenchyma. No hepatomegaly. Gallbladder is distended with some layering hyperdense material representing sludge and tiny stones seen on prior ultrasound. No biliary ductal dilation.  Pancreas: Within normal limits. Spleen: Splenomegaly measuring 22 cm in maximum craniocaudal dimension. Adrenals/Urinary Tract: Adrenal glands are unremarkable. Kidneys are normal, without renal calculi, contour deforming lesion, or hydronephrosis. Bladder is unremarkable. Stomach/Bowel: Prior gastric bypass surgery. Enteric contrast visualized to the level of the distal small bowel. No pathologic dilation of small bowel. Appendix is grossly unremarkable. Colonic diverticulosis without findings of acute diverticulitis. Vascular/Lymphatic: Aortic atherosclerosis without aneurysmal dilation. No significant interval change in the enlarged and prominent upper abdominal, retroperitoneal and mesenteric lymph nodes. Interval increase in size of the few enlarged and prominent pelvic lymph nodes. Reference lymph nodes are as follows: Periportal lymph node measuring 3.3 x 1.7 cm on image 54/3, unchanged. Aortocaval lymph node measures 1.6 x 1.5 cm on image 69/3, unchanged. Right pelvic sidewall lymph node measures 3.1 x 1.6 cm on image 109/3. Reproductive: Uterus and bilateral adnexa are unremarkable. Other: Small volume abdominopelvic ascites with diffuse mesenteric stranding. Multiple peritoneal calcifications are again visualized and unchanged from prior, likely sequela prior inflammation. Similar appearance of the nonspecific anterior abdominal wall subcutaneous calcifications. Mild diffuse subcutaneous edema. Musculoskeletal: Multilevel degenerative changes spine. No acute osseous abnormality. IMPRESSION: 1. Splenomegaly with pathologically enlarged lymph nodes above and below the diaphragm, with overall stable to minimally increased abdominal adenopathy and interval progression of the pelvic  adenopathy. 2. Small volume abdominopelvic ascites with diffuse mesenteric stranding. 3. Scattered bilateral pulmonary micro nodules measuring 1-2 mm. 4. Distended gallbladder with some layering hyperdense material representing  layering sludge and tiny stones seen on prior ultrasound. 5. Aortic atherosclerosis. Aortic Atherosclerosis (ICD10-I70.0). Electronically Signed   By: Dahlia Bailiff MD   On: 10/29/2020 19:53   CT CHEST WO CONTRAST  Result Date: 10/29/2020 CLINICAL DATA:  Hematologic malignancy, staging. EXAM: CT CHEST, ABDOMEN AND PELVIS WITHOUT CONTRAST TECHNIQUE: Multidetector CT imaging of the chest, abdomen and pelvis was performed following the standard protocol without IV contrast. COMPARISON:  History of MRI diarrhea neck no palpable status chest left at [facility] 7 right-sided than she thickened change ir indications sick concern for insert at the right 6 arm. FINDINGS: CT CHEST FINDINGS Cardiovascular: The aortic and branch vessel atherosclerosis without aneurysmal dilation. Normal size heart. No significant pericardial effusion/thickening. Mediastinum/Nodes: Enlarged supraclavicular, bilateral axillary and left mammary lymph nodes. For reference: Left internal mammary lymph node measures 1.9 by 1.2 cm on image 52/7. Right axillary lymph node measures 3.1 x 2.3 cm on image 36/7. Prominent mediastinal lymph nodes measuring up to 9 mm in short axis on image 39/7, without adenopathy by size criteria. Thyroid is grossly unremarkable. Trachea esophagus are grossly unremarkable. Lungs/Pleura: Right lower lobe calcified granuloma. Bibasilar atelectasis. Scattered bilateral micro nodules measuring 1-2 mm for instance on image 96/4 in the left lower lobe and image 72/4 in the right upper lobe. Musculoskeletal: Peripherally calcified nodule in the right axilla on image 22/3. No acute osseous abnormality. CT ABDOMEN PELVIS FINDINGS Hepatobiliary: Grossly unremarkable noncontrast appearance of the hepatic parenchyma. No hepatomegaly. Gallbladder is distended with some layering hyperdense material representing sludge and tiny stones seen on prior ultrasound. No biliary ductal dilation. Pancreas: Within normal limits. Spleen:  Splenomegaly measuring 22 cm in maximum craniocaudal dimension. Adrenals/Urinary Tract: Adrenal glands are unremarkable. Kidneys are normal, without renal calculi, contour deforming lesion, or hydronephrosis. Bladder is unremarkable. Stomach/Bowel: Prior gastric bypass surgery. Enteric contrast visualized to the level of the distal small bowel. No pathologic dilation of small bowel. Appendix is grossly unremarkable. Colonic diverticulosis without findings of acute diverticulitis. Vascular/Lymphatic: Aortic atherosclerosis without aneurysmal dilation. No significant interval change in the enlarged and prominent upper abdominal, retroperitoneal and mesenteric lymph nodes. Interval increase in size of the few enlarged and prominent pelvic lymph nodes. Reference lymph nodes are as follows: Periportal lymph node measuring 3.3 x 1.7 cm on image 54/3, unchanged. Aortocaval lymph node measures 1.6 x 1.5 cm on image 69/3, unchanged. Right pelvic sidewall lymph node measures 3.1 x 1.6 cm on image 109/3. Reproductive: Uterus and bilateral adnexa are unremarkable. Other: Small volume abdominopelvic ascites with diffuse mesenteric stranding. Multiple peritoneal calcifications are again visualized and unchanged from prior, likely sequela prior inflammation. Similar appearance of the nonspecific anterior abdominal wall subcutaneous calcifications. Mild diffuse subcutaneous edema. Musculoskeletal: Multilevel degenerative changes spine. No acute osseous abnormality. IMPRESSION: 1. Splenomegaly with pathologically enlarged lymph nodes above and below the diaphragm, with overall stable to minimally increased abdominal adenopathy and interval progression of the pelvic adenopathy. 2. Small volume abdominopelvic ascites with diffuse mesenteric stranding. 3. Scattered bilateral pulmonary micro nodules measuring 1-2 mm. 4. Distended gallbladder with some layering hyperdense material representing layering sludge and tiny stones seen on  prior ultrasound. 5. Aortic atherosclerosis. Aortic Atherosclerosis (ICD10-I70.0). Electronically Signed   By: Dahlia Bailiff MD   On: 10/29/2020 19:53   US Abdomen Limited RUQ (LIVER/GB)  Result Date: 10/28/2020 CLINICAL DATA:  Follow-up cirrhosis EXAM: ULTRASOUND ABDOMEN LIMITED RIGHT UPPER QUADRANT COMPARISON:  09/05/2019 FINDINGS: Gallbladder: Gallbladder is well distended with gallbladder sludge within. Previously seen calculi are noted within the dependent sludge. No wall thickening or pericholecystic fluid is noted. Negative sonographic Murphy's sign is elicited. Common bile duct: Diameter: 2.3 mm. Liver: No focal lesion identified. Within normal limits in parenchymal echogenicity. Portal vein is patent on color Doppler imaging with normal direction of blood flow towards the liver. Other: None. IMPRESSION: Gallbladder sludge and cholelithiasis. No complicating factors are noted. Electronically Signed   By: Inez Catalina M.D.   On: 10/28/2020 23:34     Medical Consultants:    None.   Subjective:    Kathleen Gibson relates she is hungry wants to have breakfast.  Objective:    Vitals:   10/29/20 2236 10/29/20 2332 10/30/20 0018 10/30/20 0429  BP: (!) 93/49 121/65 (!) 96/45 (!) 106/58  Pulse: 90 96 92 96  Resp: $Remo'17 18 16 15  'jEKDG$ Temp: 98.7 F (37.1 C) 98.3 F (36.8 C) 98.4 F (36.9 C) 98.4 F (36.9 C)  TempSrc: Oral Oral Oral Oral  SpO2: 96% 96% 93% 92%  Weight:      Height:       SpO2: 92 %   Intake/Output Summary (Last 24 hours) at 10/30/2020 0933 Last data filed at 10/30/2020 0600 Gross per 24 hour  Intake 1830 ml  Output 3175 ml  Net -1345 ml   Filed Weights   10/29/20 0125  Weight: 75.7 kg    Exam: General exam: In no acute distress. Respiratory system: Good air movement and clear to auscultation. Cardiovascular system: S1 & S2 heard, RRR. No JVD. Gastrointestinal system: Abdomen is nondistended, soft and nontender.  Extremities: No pedal edema. Skin: No rashes,  lesions or ulcers Psychiatry: Judgement and insight appear normal. Mood & affect appropriate.   Data Reviewed:    Labs: Basic Metabolic Panel: Recent Labs  Lab 10/28/20 0952 10/28/20 1806 10/29/20 0734  NA 136 135 139  K 3.8 3.8 3.4*  CL 103 104 108  CO2 $Re'27 25 26  'MGs$ GLUCOSE 167* 199* 116*  BUN $Re'16 16 10  'GkN$ CREATININE 0.77 0.87 0.77  CALCIUM 8.4 8.5* 8.0*  MG  --   --  1.8   GFR Estimated Creatinine Clearance: 64.6 mL/min (by C-G formula based on SCr of 0.77 mg/dL). Liver Function Tests: Recent Labs  Lab 10/28/20 0952 10/28/20 1806 10/29/20 0734  AST $Re'14 18 16  'LWb$ ALT $R'7 11 9  'IW$ ALKPHOS 48 42 40  BILITOT 0.8 0.9 0.9  PROT 8.2 8.6* 7.8  ALBUMIN 3.4* 3.1* 2.9*   No results for input(s): LIPASE, AMYLASE in the last 168 hours. No results for input(s): AMMONIA in the last 168 hours. Coagulation profile Recent Labs  Lab 10/28/20 1806  INR 1.2   COVID-19 Labs  Recent Labs    10/28/20 0952 10/29/20 0734  FERRITIN  --  27  LDH  --  155  CRP <1.0  --     Lab Results  Component Value Date   SARSCOV2NAA NEGATIVE 10/28/2020   SARSCOV2NAA NEGATIVE 09/06/2019   [place] NEGATIVE 09/04/2019    CBC: Recent Labs  Lab 10/28/20 0952 10/28/20 1806 10/29/20 0734 10/30/20 0545  WBC 9.2 10.5 8.5 10.0  NEUTROABS 6.7 6.1 6.5 5.2  HGB 8.9 Repeated and verified X2.* 8.3* 8.1* 7.6*  HCT 26.8* 27.4* 27.0* 26.3*  MCV 86.8 93.8 92.5 96.0  PLT 4.0 Repeated and verified X2.* <5* <5* <5*  Cardiac Enzymes: No results for input(s): CKTOTAL, CKMB, CKMBINDEX, TROPONINI in the last 168 hours. BNP (last 3 results) No results for input(s): PROBNP in the last 8760 hours. CBG: Recent Labs  Lab 10/29/20 0700 10/29/20 1149 10/29/20 1655 10/29/20 2108 10/30/20 0647  GLUCAP 117* 157* 98 118* 130*   D-Dimer: No results for input(s): DDIMER in the last 72 hours. Hgb A1c: Recent Labs    10/28/20 0914 10/29/20 0734  HGBA1C 5.7  5.7  5.7  5.7* 5.9*   Lipid Profile: No  results for input(s): CHOL, HDL, LDLCALC, TRIG, CHOLHDL, LDLDIRECT in the last 72 hours. Thyroid function studies: Recent Labs    10/28/20 0952  TSH 3.17   Anemia work up: Recent Labs    10/29/20 0734  VITAMINB12 3,903*  FOLATE 10.3  FERRITIN 27  TIBC 311  IRON 47  RETICCTPCT 3.5*   Sepsis Labs: Recent Labs  Lab 10/28/20 0952 10/28/20 1806 10/29/20 0734 10/30/20 0545  WBC 9.2 10.5 8.5 10.0   Microbiology Recent Results (from the past 240 hour(s))  Resp Panel by RT-PCR (Flu A&B, Covid) Nasopharyngeal Swab     Status: None   Collection Time: 10/28/20  6:07 PM   Specimen: Nasopharyngeal Swab; Nasopharyngeal(NP) swabs in vial transport medium  Result Value Ref Range Status   SARS Coronavirus 2 by RT PCR NEGATIVE NEGATIVE Final    Comment: (NOTE) SARS-CoV-2 target nucleic acids are NOT DETECTED.  The SARS-CoV-2 RNA is generally detectable in upper respiratory specimens during the acute phase of infection. The lowest concentration of SARS-CoV-2 viral copies this assay can detect is 138 copies/mL. A negative result does not preclude SARS-Cov-2 infection and should not be used as the sole basis for treatment or other patient management decisions. A negative result may occur with  improper specimen collection/handling, submission of specimen other than nasopharyngeal swab, presence of viral mutation(s) within the areas targeted by this assay, and inadequate number of viral copies(<138 copies/mL). A negative result must be combined with clinical observations, patient history, and epidemiological information. The expected result is Negative.  Fact Sheet for Patients:  EntrepreneurPulse.com.au  Fact Sheet for Healthcare Providers:  IncredibleEmployment.be  This test is no t yet approved or cleared by the [place] FDA and  has been authorized for detection and/or diagnosis of SARS-CoV-2 by FDA under an Emergency Use Authorization  (EUA). This EUA will remain  in effect (meaning this test can be used) for the duration of the COVID-19 declaration under Section 564(b)(1) of the Act, 21 U.S.C.section 360bbb-3(b)(1), unless the authorization is terminated  or revoked sooner.       Influenza A by PCR NEGATIVE NEGATIVE Final   Influenza B by PCR NEGATIVE NEGATIVE Final    Comment: (NOTE) The Xpert Xpress SARS-CoV-2/FLU/RSV plus assay is intended as an aid in the diagnosis of influenza from Nasopharyngeal swab specimens and should not be used as a sole basis for treatment. Nasal washings and aspirates are unacceptable for Xpert Xpress SARS-CoV-2/FLU/RSV testing.  Fact Sheet for Patients: EntrepreneurPulse.com.au  Fact Sheet for Healthcare Providers: IncredibleEmployment.be  This test is not yet approved or cleared by the [place] FDA and has been authorized for detection and/or diagnosis of SARS-CoV-2 by FDA under an Emergency Use Authorization (EUA). This EUA will remain in effect (meaning this test can be used) for the duration of the COVID-19 declaration under Section 564(b)(1) of the Act, 21 U.S.C. section 360bbb-3(b)(1), unless the authorization is terminated or revoked.  Performed at [facility], [place]  [address], [place], [place] [address]      Medications:   . fentaNYL      . folic acid  1 mg Oral Daily  . gabapentin  800 mg Oral TID  . insulin aspart  0-15 Units Subcutaneous TID AC & HS  . lidocaine      . metoprolol succinate  50 mg Oral Daily  . midazolam      . mirtazapine  15 mg Oral QHS  . potassium chloride  20 mEq Oral BID  . predniSONE  60 mg Oral Q breakfast   Continuous Infusions: . Immune Globulin 10% Stopped (10/29/20 2330)      LOS: 1 day   Charlynne Cousins  [facility]  10/30/2020, 9:33 AM

## 2020-10-30 NOTE — Procedures (Signed)
Interventional Radiology Procedure Note  Procedure: US guided biopsy of right axillary lymph node Complications: None EBL: None Recommendations: - Routine wound care - Follow up pathology - Advance diet per primary  Signed,  Corrie Mckusick, DO

## 2020-10-30 NOTE — Plan of Care (Signed)
  Problem: Activity: Goal: Risk for activity intolerance will decrease Outcome: Progressing   

## 2020-10-30 NOTE — Sedation Documentation (Signed)
Kathleen Greathouse, PA at bedside.

## 2020-10-30 NOTE — Progress Notes (Signed)
Interventional Radiology Brief Note:  Patient seen and consented for bone marrow biopsy in the setting of chronic thrombocytopenia and diffuse adenopathy. After consultation with oncology yesterday, additional biopsy of lymph node was requested.  This was reviewed by Dr. Annamaria Boots and approved.  Lymph node biopsy of R axillary lymph node added to patient's consent and cosigned by this PA and patient.   She has been NPO today.  Agreeable to proceed.   Brynda Greathouse, MS RD PA-C 8:58 AM

## 2020-10-30 NOTE — Procedures (Signed)
Interventional Radiology Procedure Note  Procedure: CT guided aspirate and core biopsy of right posterior iliac bone Complications: None Recommendations: - Bedrest supine x 1 hrs - OTC's PRN  Pain - Follow biopsy results  Signed,  Dicky Boer S. Flynt Breeze, DO    

## 2020-10-31 DIAGNOSIS — E1142 Type 2 diabetes mellitus with diabetic polyneuropathy: Secondary | ICD-10-CM | POA: Diagnosis not present

## 2020-10-31 DIAGNOSIS — R591 Generalized enlarged lymph nodes: Secondary | ICD-10-CM

## 2020-10-31 DIAGNOSIS — D693 Immune thrombocytopenic purpura: Principal | ICD-10-CM

## 2020-10-31 DIAGNOSIS — D649 Anemia, unspecified: Secondary | ICD-10-CM | POA: Diagnosis not present

## 2020-10-31 DIAGNOSIS — I10 Essential (primary) hypertension: Secondary | ICD-10-CM | POA: Diagnosis not present

## 2020-10-31 LAB — BPAM PLATELET PHERESIS
Blood Product Expiration Date: 202205222359
Blood Product Expiration Date: 202205222359
ISSUE DATE / TIME: 202205201056
ISSUE DATE / TIME: 202205201342
Unit Type and Rh: 6200
Unit Type and Rh: 6200

## 2020-10-31 LAB — PREPARE PLATELET PHERESIS
Unit division: 0
Unit division: 0

## 2020-10-31 LAB — CBC
HCT: 22 % — ABNORMAL LOW (ref 36.0–46.0)
Hemoglobin: 6.5 g/dL — CL (ref 12.0–15.0)
MCH: 27.9 pg (ref 26.0–34.0)
MCHC: 29.5 g/dL — ABNORMAL LOW (ref 30.0–36.0)
MCV: 94.4 fL (ref 80.0–100.0)
Platelets: 8 10*3/uL — CL (ref 150–400)
RBC: 2.33 MIL/uL — ABNORMAL LOW (ref 3.87–5.11)
RDW: 18.8 % — ABNORMAL HIGH (ref 11.5–15.5)
WBC: 12.8 10*3/uL — ABNORMAL HIGH (ref 4.0–10.5)
nRBC: 0.3 % — ABNORMAL HIGH (ref 0.0–0.2)

## 2020-10-31 LAB — GLUCOSE, CAPILLARY
Glucose-Capillary: 109 mg/dL — ABNORMAL HIGH (ref 70–99)
Glucose-Capillary: 259 mg/dL — ABNORMAL HIGH (ref 70–99)
Glucose-Capillary: 295 mg/dL — ABNORMAL HIGH (ref 70–99)
Glucose-Capillary: 301 mg/dL — ABNORMAL HIGH (ref 70–99)

## 2020-10-31 LAB — PREPARE RBC (CROSSMATCH)

## 2020-10-31 MED ORDER — FUROSEMIDE 10 MG/ML IJ SOLN
20.0000 mg | Freq: Once | INTRAMUSCULAR | Status: AC
Start: 2020-10-31 — End: 2020-10-31
  Administered 2020-10-31: 20 mg via INTRAVENOUS
  Filled 2020-10-31: qty 2

## 2020-10-31 MED ORDER — SODIUM CHLORIDE 0.9% IV SOLUTION
Freq: Once | INTRAVENOUS | Status: AC
Start: 2020-10-31 — End: 2020-10-31
  Administered 2020-10-31: 10 mL/h via INTRAVENOUS

## 2020-10-31 MED ORDER — ACETAMINOPHEN 325 MG PO TABS
650.0000 mg | ORAL_TABLET | Freq: Once | ORAL | Status: DC
  Filled 2020-10-31: qty 2

## 2020-10-31 MED ORDER — DIPHENHYDRAMINE HCL 25 MG PO CAPS
25.0000 mg | ORAL_CAPSULE | Freq: Once | ORAL | Status: AC
  Administered 2020-10-31: 25 mg via ORAL
  Filled 2020-10-31: qty 1

## 2020-10-31 MED ORDER — ACETAMINOPHEN 325 MG PO TABS
650.0000 mg | ORAL_TABLET | Freq: Once | ORAL | Status: AC
  Administered 2020-10-31: 650 mg via ORAL
  Filled 2020-10-31: qty 2

## 2020-10-31 NOTE — Progress Notes (Signed)
   10/31/20 0845  Assess: MEWS Score  Temp 97.8 F (36.6 C)  BP (!) 132/54  Pulse Rate (!) 125  Resp 19  Level of Consciousness Alert  SpO2 100 %  O2 Device Room Air  Assess: MEWS Score  MEWS Temp 0  MEWS Systolic 0  MEWS Pulse 2  MEWS RR 0  MEWS LOC 0  MEWS Score 2  MEWS Score Color Yellow  Assess: if the MEWS score is Yellow or Red  Were vital signs taken at a resting state? Yes  Focused Assessment No change from prior assessment  Early Detection of Sepsis Score *See Row Information* Low  MEWS guidelines implemented *See Row Information* No, previously yellow, continue vital signs every 4 hours  Treat  MEWS Interventions Administered scheduled meds/treatments  Pain Scale 0-10  Pain Score 0  Notify: Charge Nurse/RN  Name of Charge Nurse/RN Notified Debarah Crape R.N.  Date Charge Nurse/RN Notified 10/31/20  Time Charge Nurse/RN Notified [phone]  Notify: Provider  Provider Name/Title Dr. Aileen Fass  Date Provider Notified 10/31/20  Time Provider Notified [phone]  Notification Type Face-to-face  Notification Reason Other (Comment) (About to receive Blood transfusion.)  Provider response At bedside;No new orders  Date of Provider Response 10/31/20  Time of Provider Response [phone]

## 2020-10-31 NOTE — Progress Notes (Signed)
[facility] PROGRESS NOTE    Progress Note  Kathleen Gibson  OQH:[id number] DOB: May 09, 1951 DOA: 10/28/2020 PCP: [name], DO     Brief Narrative:   Kathleen Gibson is an 70 y.o. female past medical history of diabetes mellitus type 2, diabetic neuropathy, ITP diagnosed in 2014 and followed by Dr. Annamaria Boots as an outpatient, medical noncompliance, gastric bypass in 2000 essential hypertension pernicious anemia due to B12 deficiency nonalcoholic fatty liver disease comes into the [facility] due to severe thrombocytopenia, she relates she about 8 weeks ago she and her family got sick and since then she has been feeling down.  She relates poor appetite and severe malaise started developing some nosebleeds which was able to control with compression maneuvers.  Her platelet count in the ED was 5 she is transfuse 1 unit of packed platelets hematology was consulted.    Significant Events: 10/28/2020 1 unit of packed platelets  Significant studies: 10/28/2020 abdominal ultrasound gallbladder sludge no acute cholecystitis  Antibiotics: None   Assessment/Plan:   Acute thrombocytopenia: Bone marrow biopsy and axillary lymph node biopsy performed on 10/31/2020. Status post 4 units of platelets. Awaiting pathology report. Oncology on board. Continue prednisone, IVIG.  Oncology started her on Nplate yesterday.  Diffuse lymphadenopathy: Concerning for lymphoma. IR was consulted and performed a bone marrow biopsy on 10/30/2020. Has a small hematoma in the biopsy site we will keep a close eye check a CBC tomorrow morning.  Acute blood loss anemia/Normocytic anemia: She is being transfused 1 unit of packed red blood cells, she was premedicated with Benadryl and Tylenol. Check a CBC posttransfusion. Ferritin is low we will start her on oral ferrous sulfate as an outpatient.  Mild leukocytosis: She has remained afebrile likely due to steroids, will monitor fever curve.  Type 2  diabetes mellitus with diabetic polyneuropathy, without long-term current use of insulin  Hold home oral hypoglycemic agents. A1C 5.9.  She is currently on sliding scale insulin meal coverage blood glucose fairly controlled.  Diabetic polyneuropathy associated with type 2 diabetes mellitus (HCC) Continue gabapentin.  Essential hypertension: Continue metoprolol.  B12 deficiency Her B12 is 4000 we will hold on on repletion    History of noncompliance with medical treatment She has been counseled.  She has been counseled  Abdominal lymphadenopathy Further work-up as an outpatient.    DVT prophylaxis: SCD Family Communication:none Status is: Observation  The patient will require care spanning > 2 midnights and should be moved to inpatient because: Hemodynamically unstable  Dispo: The patient is from: Home              Anticipated d/c is to: Home              Patient currently is not medically stable to d/c.   Difficult to place patient No    Code Status:     Code Status Orders  (From admission, onward)         Start     Ordered   10/28/20 2337  Do not attempt resuscitation (DNR)  Continuous       Question Answer Comment  In the event of cardiac or respiratory ARREST Do not call a "code blue"   In the event of cardiac or respiratory ARREST Do not perform Intubation, CPR, defibrillation or ACLS   In the event of cardiac or respiratory ARREST Use medication by any route, position, wound care, and other measures to relive pain and suffering. May use oxygen, suction and  manual treatment of airway obstruction as needed for comfort.      10/28/20 2336        Code Status History    Date Active Date Inactive Code Status Order ID Comments User Context   09/06/2019 0407 09/07/2019 1751 Full Code [id number]  [name], MD Inpatient   Advance Care Planning Activity        IV Access:    Peripheral IV   Procedures and diagnostic studies:   CT ABDOMEN PELVIS WO  CONTRAST  Result Date: 10/29/2020 CLINICAL DATA:  Hematologic malignancy, staging. EXAM: CT CHEST, ABDOMEN AND PELVIS WITHOUT CONTRAST TECHNIQUE: Multidetector CT imaging of the chest, abdomen and pelvis was performed following the standard protocol without IV contrast. COMPARISON:  History of MRI diarrhea neck no palpable status chest left at [facility] 7 right-sided than she thickened change ir indications sick concern for insert at the right 6 arm. FINDINGS: CT CHEST FINDINGS Cardiovascular: The aortic and branch vessel atherosclerosis without aneurysmal dilation. Normal size heart. No significant pericardial effusion/thickening. Mediastinum/Nodes: Enlarged supraclavicular, bilateral axillary and left mammary lymph nodes. For reference: Left internal mammary lymph node measures 1.9 by 1.2 cm on image 52/7. Right axillary lymph node measures 3.1 x 2.3 cm on image 36/7. Prominent mediastinal lymph nodes measuring up to 9 mm in short axis on image 39/7, without adenopathy by size criteria. Thyroid is grossly unremarkable. Trachea esophagus are grossly unremarkable. Lungs/Pleura: Right lower lobe calcified granuloma. Bibasilar atelectasis. Scattered bilateral micro nodules measuring 1-2 mm for instance on image 96/4 in the left lower lobe and image 72/4 in the right upper lobe. Musculoskeletal: Peripherally calcified nodule in the right axilla on image 22/3. No acute osseous abnormality. CT ABDOMEN PELVIS FINDINGS Hepatobiliary: Grossly unremarkable noncontrast appearance of the hepatic parenchyma. No hepatomegaly. Gallbladder is distended with some layering hyperdense material representing sludge and tiny stones seen on prior ultrasound. No biliary ductal dilation. Pancreas: Within normal limits. Spleen: Splenomegaly measuring 22 cm in maximum craniocaudal dimension. Adrenals/Urinary Tract: Adrenal glands are unremarkable. Kidneys are normal, without renal calculi, contour deforming lesion, or hydronephrosis. Bladder  is unremarkable. Stomach/Bowel: Prior gastric bypass surgery. Enteric contrast visualized to the level of the distal small bowel. No pathologic dilation of small bowel. Appendix is grossly unremarkable. Colonic diverticulosis without findings of acute diverticulitis. Vascular/Lymphatic: Aortic atherosclerosis without aneurysmal dilation. No significant interval change in the enlarged and prominent upper abdominal, retroperitoneal and mesenteric lymph nodes. Interval increase in size of the few enlarged and prominent pelvic lymph nodes. Reference lymph nodes are as follows: Periportal lymph node measuring 3.3 x 1.7 cm on image 54/3, unchanged. Aortocaval lymph node measures 1.6 x 1.5 cm on image 69/3, unchanged. Right pelvic sidewall lymph node measures 3.1 x 1.6 cm on image 109/3. Reproductive: Uterus and bilateral adnexa are unremarkable. Other: Small volume abdominopelvic ascites with diffuse mesenteric stranding. Multiple peritoneal calcifications are again visualized and unchanged from prior, likely sequela prior inflammation. Similar appearance of the nonspecific anterior abdominal wall subcutaneous calcifications. Mild diffuse subcutaneous edema. Musculoskeletal: Multilevel degenerative changes spine. No acute osseous abnormality. IMPRESSION: 1. Splenomegaly with pathologically enlarged lymph nodes above and below the diaphragm, with overall stable to minimally increased abdominal adenopathy and interval progression of the pelvic adenopathy. 2. Small volume abdominopelvic ascites with diffuse mesenteric stranding. 3. Scattered bilateral pulmonary micro nodules measuring 1-2 mm. 4. Distended gallbladder with some layering hyperdense material representing layering sludge and tiny stones seen on prior ultrasound. 5. Aortic atherosclerosis. Aortic Atherosclerosis (ICD10-I70.0). Electronically Signed  By: Dahlia Bailiff MD   On: 10/29/2020 19:53   CT CHEST WO CONTRAST  Result Date: 10/29/2020 CLINICAL DATA:   Hematologic malignancy, staging. EXAM: CT CHEST, ABDOMEN AND PELVIS WITHOUT CONTRAST TECHNIQUE: Multidetector CT imaging of the chest, abdomen and pelvis was performed following the standard protocol without IV contrast. COMPARISON:  History of MRI diarrhea neck no palpable status chest left at [facility] 7 right-sided than she thickened change ir indications sick concern for insert at the right 6 arm. FINDINGS: CT CHEST FINDINGS Cardiovascular: The aortic and branch vessel atherosclerosis without aneurysmal dilation. Normal size heart. No significant pericardial effusion/thickening. Mediastinum/Nodes: Enlarged supraclavicular, bilateral axillary and left mammary lymph nodes. For reference: Left internal mammary lymph node measures 1.9 by 1.2 cm on image 52/7. Right axillary lymph node measures 3.1 x 2.3 cm on image 36/7. Prominent mediastinal lymph nodes measuring up to 9 mm in short axis on image 39/7, without adenopathy by size criteria. Thyroid is grossly unremarkable. Trachea esophagus are grossly unremarkable. Lungs/Pleura: Right lower lobe calcified granuloma. Bibasilar atelectasis. Scattered bilateral micro nodules measuring 1-2 mm for instance on image 96/4 in the left lower lobe and image 72/4 in the right upper lobe. Musculoskeletal: Peripherally calcified nodule in the right axilla on image 22/3. No acute osseous abnormality. CT ABDOMEN PELVIS FINDINGS Hepatobiliary: Grossly unremarkable noncontrast appearance of the hepatic parenchyma. No hepatomegaly. Gallbladder is distended with some layering hyperdense material representing sludge and tiny stones seen on prior ultrasound. No biliary ductal dilation. Pancreas: Within normal limits. Spleen: Splenomegaly measuring 22 cm in maximum craniocaudal dimension. Adrenals/Urinary Tract: Adrenal glands are unremarkable. Kidneys are normal, without renal calculi, contour deforming lesion, or hydronephrosis. Bladder is unremarkable. Stomach/Bowel: Prior gastric bypass  surgery. Enteric contrast visualized to the level of the distal small bowel. No pathologic dilation of small bowel. Appendix is grossly unremarkable. Colonic diverticulosis without findings of acute diverticulitis. Vascular/Lymphatic: Aortic atherosclerosis without aneurysmal dilation. No significant interval change in the enlarged and prominent upper abdominal, retroperitoneal and mesenteric lymph nodes. Interval increase in size of the few enlarged and prominent pelvic lymph nodes. Reference lymph nodes are as follows: Periportal lymph node measuring 3.3 x 1.7 cm on image 54/3, unchanged. Aortocaval lymph node measures 1.6 x 1.5 cm on image 69/3, unchanged. Right pelvic sidewall lymph node measures 3.1 x 1.6 cm on image 109/3. Reproductive: Uterus and bilateral adnexa are unremarkable. Other: Small volume abdominopelvic ascites with diffuse mesenteric stranding. Multiple peritoneal calcifications are again visualized and unchanged from prior, likely sequela prior inflammation. Similar appearance of the nonspecific anterior abdominal wall subcutaneous calcifications. Mild diffuse subcutaneous edema. Musculoskeletal: Multilevel degenerative changes spine. No acute osseous abnormality. IMPRESSION: 1. Splenomegaly with pathologically enlarged lymph nodes above and below the diaphragm, with overall stable to minimally increased abdominal adenopathy and interval progression of the pelvic adenopathy. 2. Small volume abdominopelvic ascites with diffuse mesenteric stranding. 3. Scattered bilateral pulmonary micro nodules measuring 1-2 mm. 4. Distended gallbladder with some layering hyperdense material representing layering sludge and tiny stones seen on prior ultrasound. 5. Aortic atherosclerosis. Aortic Atherosclerosis (ICD10-I70.0). Electronically Signed   By: Dahlia Bailiff MD   On: 10/29/2020 19:53   CT BIOPSY  Result Date: 10/30/2020 INDICATION: 70 year old female presents for ultrasound-guided right axillary node  biopsy EXAM: ULTRASOUND-GUIDED RIGHT AXILLARY NODE BIOPSY MEDICATIONS: None. ANESTHESIA/SEDATION: Moderate (conscious) sedation was employed during this procedure, as the same moderate sedation that was performed during the accession number [id number]. Same sedation time for sequential procedures. A total of Versed 3.0 mg and  Fentanyl 125 mcg was administered intravenously. Moderate Sedation Time: 42 minutes. The patient's level of consciousness and vital signs were monitored continuously by radiology nursing throughout the procedure under my direct supervision. FLUOROSCOPY TIME:  ULTRASOUND COMPLICATIONS: NONE PROCEDURE: Informed written consent was obtained from the patient after a thorough discussion of the procedural risks, benefits and alternatives. All questions were addressed. Maximal Sterile Barrier Technique was utilized including caps, mask, sterile gowns, sterile gloves, sterile drape, hand hygiene and skin antiseptic. A timeout was performed prior to the initiation of the procedure. After a bone marrow biopsy was performed under CT guidance, the patient was turned into the supine position on her stretcher. Ultrasound images of the right axillary region were performed with images stored. The right axillary region was prepped with chlorhexidine in a sterile fashion, and a sterile drape was applied covering the operative field. A sterile gown and sterile gloves were used for the procedure. Local anesthesia was provided with 1% Lidocaine. Ultrasound guidance was used to infiltrate the region with 1% lidocaine for local anesthesia. Five separate 18 gauge core biopsy were then acquired of the right axillary node using ultrasound guidance. Specimens placed into fresh specimen/saline. Patient tolerated the procedure well and remained hemodynamically stable throughout. No complications were encountered and no significant blood loss was encounter IMPRESSION: Status post ultrasound-guided biopsy of right axillary  lymph node. Signed, Dulcy Fanny. Dellia Nims, RPVI [facility] Radiology Electronically Signed   By: Corrie Mckusick D.O.   On: 10/30/2020 11:05   CT BONE MARROW BIOPSY & ASPIRATION  Result Date: 10/30/2020 INDICATION: 70 year old female referred for bone marrow biopsy EXAM: CT BONE MARROW BIOPSY AND ASPIRATION MEDICATIONS: None. ANESTHESIA/SEDATION: Moderate (conscious) sedation was employed during this procedure. A total of Versed 3.0 mg and Fentanyl 125 mcg was administered intravenously. Moderate Sedation Time: 42 minutes. The patient's level of consciousness and vital signs were monitored continuously by radiology nursing throughout the procedure under my direct supervision. FLUOROSCOPY TIME:  CT COMPLICATIONS: None PROCEDURE: The procedure risks, benefits, and alternatives were explained to the patient. Questions regarding the procedure were encouraged and answered. The patient understands and consents to the procedure. Scout CT of the pelvis was performed for surgical planning purposes. The posterior pelvis was prepped with Chlorhexidine in a sterile fashion, and a sterile drape was applied covering the operative field. A sterile gown and sterile gloves were used for the procedure. Local anesthesia was provided with 1% Lidocaine. Posterior right iliac bone was targeted for biopsy. The skin and subcutaneous tissues were infiltrated with 1% lidocaine without epinephrine. A small stab incision was made with an 11 blade scalpel, and an 11 gauge Murphy needle was advanced with CT guidance to the posterior cortex. Manual forced was used to advance the needle through the posterior cortex and the stylet was removed. A bone marrow aspirate was retrieved and passed to a cytotechnologist in the room. The Murphy needle was then advanced without the stylet for a core biopsy. The core biopsy was retrieved and also passed to a cytotechnologist. Manual pressure was used for  hemostasis and a sterile dressing was placed. No complications were encountered no significant blood loss was encountered. Patient tolerated the procedure well and remained hemodynamically stable throughout. After the bone marrow biopsy was completed, the patient was then positioned supine on her stretcher and we proceeded with the next staff of the planned case, a right axillary lymph node ultrasound-guided biopsy. The same sedation time is applied for both procedures. IMPRESSION:  Status post CT-guided bone marrow biopsy, with tissue specimen sent to pathology for complete histopathologic analysis Signed, Dulcy Fanny. Earleen Newport, DO [facility] Radiology Electronically Signed   By: Corrie Mckusick D.O.   On: 10/30/2020 11:02     Medical Consultants:    None.   Subjective:    Kathleen Gibson 20 complaints feels great.  Objective:    Vitals:   10/31/20 0515 10/31/20 0535 10/31/20 0715 10/31/20 0845  BP: (!) 129/93 (!) 100/39 (!) 104/54 (!) 132/54  Pulse: (!) 104 (!) 103 (!) 105 (!) 125  Resp: $Remo'17 17 18 19  'ECkUV$ Temp: 98.6 F (37 C) 97.8 F (36.6 C) 98.1 F (36.7 C) 97.8 F (36.6 C)  TempSrc: Oral Oral Oral Oral  SpO2: 97% 98% 94% 100%  Weight:      Height:       SpO2: 100 % O2 Flow Rate (L/min): 2 L/min   Intake/Output Summary (Last 24 hours) at 10/31/2020 0912 Last data filed at 10/31/2020 0715 Gross per 24 hour  Intake 1659.4 ml  Output 776 ml  Net 883.4 ml   Filed Weights   10/29/20 0125  Weight: 75.7 kg    Exam: General exam: In no acute distress. Respiratory system: Good air movement and clear to auscultation. Cardiovascular system: S1 & S2 heard, RRR. No JVD. Gastrointestinal system: Abdomen is nondistended, soft and nontender.  Extremities: No pedal edema. Skin: No rashes, lesions or ulcers Psychiatry: Judgement and insight appear normal. Mood & affect appropriate.  Data Reviewed:    Labs: Basic Metabolic Panel: Recent  Labs  Lab 10/28/20 0952 10/28/20 1806 10/29/20 0734  NA 136 135 139  K 3.8 3.8 3.4*  CL 103 104 108  CO2 $Re'27 25 26  'lBk$ GLUCOSE 167* 199* 116*  BUN $Re'16 16 10  'MDe$ CREATININE 0.77 0.87 0.77  CALCIUM 8.4 8.5* 8.0*  MG  --   --  1.8   GFR Estimated Creatinine Clearance: 64.6 mL/min (by C-G formula based on SCr of 0.77 mg/dL). Liver Function Tests: Recent Labs  Lab 10/28/20 0952 10/28/20 1806 10/29/20 0734  AST $Re'14 18 16  'wKB$ ALT $R'7 11 9  'Bx$ ALKPHOS 48 42 40  BILITOT 0.8 0.9 0.9  PROT 8.2 8.6* 7.8  ALBUMIN 3.4* 3.1* 2.9*   No results for input(s): LIPASE, AMYLASE in the last 168 hours. No results for input(s): AMMONIA in the last 168 hours. Coagulation profile Recent Labs  Lab 10/28/20 1806  INR 1.2   COVID-19 Labs  Recent Labs    10/28/20 0937 10/28/20 0952 10/29/20 0734  FERRITIN  --  29 27  LDH 165  --  155  CRP  --  <1.0  --     Lab Results  Component Value Date   SARSCOV2NAA NEGATIVE 10/28/2020   SARSCOV2NAA NEGATIVE 09/06/2019   [place] NEGATIVE 09/04/2019    CBC: Recent Labs  Lab 10/28/20 0952 10/28/20 1806 10/29/20 0734 10/30/20 0545 10/31/20 0217  WBC 9.2 10.5 8.5 10.0 12.8*  NEUTROABS 6.7 6.1 6.5 5.2  --   HGB 8.9 Repeated and verified X2.* 8.3* 8.1* 7.6* 6.5*  HCT 26.8* 27.4* 27.0* 26.3* 22.0*  MCV 86.8 93.8 92.5 96.0 94.4  PLT 4.0 Repeated and verified X2.* <5* <5* <5* 8*   Cardiac Enzymes: No results for input(s): CKTOTAL, CKMB, CKMBINDEX, TROPONINI in the last 168 hours. BNP (last 3 results) No results for input(s): PROBNP in the last 8760 hours. CBG: Recent Labs  Lab 10/30/20 0647 10/30/20 1150 10/30/20 1624  10/30/20 2153 10/31/20 0702  GLUCAP 130* 105* 227* 344* 109*   D-Dimer: No results for input(s): DDIMER in the last 72 hours. Hgb A1c: Recent Labs    10/28/20 0914 10/29/20 0734  HGBA1C 5.7  5.7  5.7  5.7* 5.9*   Lipid Profile: No results for input(s): CHOL, HDL, LDLCALC, TRIG, CHOLHDL, LDLDIRECT in the last 72  hours. Thyroid function studies: Recent Labs    10/28/20 0952  TSH 3.17   Anemia work up: Recent Labs    10/28/20 0952 10/29/20 0734  VITAMINB12  --  3,903*  FOLATE  --  10.3  FERRITIN 29 27  TIBC 305 311  IRON 52 47  RETICCTPCT  --  3.5*   Sepsis Labs: Recent Labs  Lab 10/28/20 1806 10/29/20 0734 10/30/20 0545 10/31/20 0217  WBC 10.5 8.5 10.0 12.8*   Microbiology Recent Results (from the past 240 hour(s))  Resp Panel by RT-PCR (Flu A&B, Covid) Nasopharyngeal Swab     Status: None   Collection Time: 10/28/20  6:07 PM   Specimen: Nasopharyngeal Swab; Nasopharyngeal(NP) swabs in vial transport medium  Result Value Ref Range Status   SARS Coronavirus 2 by RT PCR NEGATIVE NEGATIVE Final    Comment: (NOTE) SARS-CoV-2 target nucleic acids are NOT DETECTED.  The SARS-CoV-2 RNA is generally detectable in upper respiratory specimens during the acute phase of infection. The lowest concentration of SARS-CoV-2 viral copies this assay can detect is 138 copies/mL. A negative result does not preclude SARS-Cov-2 infection and should not be used as the sole basis for treatment or other patient management decisions. A negative result may occur with  improper specimen collection/handling, submission of specimen other than nasopharyngeal swab, presence of viral mutation(s) within the areas targeted by this assay, and inadequate number of viral copies(<138 copies/mL). A negative result must be combined with clinical observations, patient history, and epidemiological information. The expected result is Negative.  Fact Sheet for Patients:  EntrepreneurPulse.com.au  Fact Sheet for Healthcare Providers:  IncredibleEmployment.be  This test is no t yet approved or cleared by the [place] FDA and  has been authorized for detection and/or diagnosis of SARS-CoV-2 by FDA under an Emergency Use Authorization (EUA). This EUA will remain  in effect  (meaning this test can be used) for the duration of the COVID-19 declaration under Section 564(b)(1) of the Act, 21 U.S.C.section 360bbb-3(b)(1), unless the authorization is terminated  or revoked sooner.       Influenza A by PCR NEGATIVE NEGATIVE Final   Influenza B by PCR NEGATIVE NEGATIVE Final    Comment: (NOTE) The Xpert Xpress SARS-CoV-2/FLU/RSV plus assay is intended as an aid in the diagnosis of influenza from Nasopharyngeal swab specimens and should not be used as a sole basis for treatment. Nasal washings and aspirates are unacceptable for Xpert Xpress SARS-CoV-2/FLU/RSV testing.  Fact Sheet for Patients: EntrepreneurPulse.com.au  Fact Sheet for Healthcare Providers: IncredibleEmployment.be  This test is not yet approved or cleared by the [place] FDA and has been authorized for detection and/or diagnosis of SARS-CoV-2 by FDA under an Emergency Use Authorization (EUA). This EUA will remain in effect (meaning this test can be used) for the duration of the COVID-19 declaration under Section 564(b)(1) of the Act, 21 U.S.C. section 360bbb-3(b)(1), unless the authorization is terminated or revoked.  Performed at [facility], [place] [address], [place], [place] [address]      Medications:   . folic acid  1 mg Oral Daily  . gabapentin  800  mg Oral TID  . insulin aspart  0-15 Units Subcutaneous TID AC & HS  . metoprolol succinate  50 mg Oral Daily  . mirtazapine  15 mg Oral QHS  . potassium chloride  20 mEq Oral BID  . predniSONE  60 mg Oral Q breakfast   Continuous Infusions:     LOS: 2 days   Charlynne Cousins  [facility]  10/31/2020, 9:12 AM

## 2020-10-31 NOTE — Progress Notes (Signed)
Notified by RN that labs this am show hgb of 6.5, down from 7.2.  Platelet count is 8000.   Transfuse 1 unit PRBC and 1 unit platelets.

## 2020-10-31 NOTE — Progress Notes (Addendum)
Date and time results received: 10/31/20 0328  Test: CBC Critical Value  HgB 6.5, HCT 22, PLT 8   Name of Provider Notified: Dr. Serina Cowper   [phone] Received orders for 1u PRBC, and 1 platelet to be infused. Pt does not appear to be bleeding, noted hematoma under R arm at biopsy site, marked and borders have not changed since marked at 2400.

## 2020-10-31 NOTE — Progress Notes (Signed)
Place her on continous oxygen monitor,96% on room air.

## 2020-10-31 NOTE — Plan of Care (Signed)
  Problem: Education: Goal: Knowledge of General Education information will improve Description: Including pain rating scale, medication(s)/side effects and non-pharmacologic comfort measures Outcome: Progressing   Problem: Health Behavior/Discharge Planning: Goal: Ability to manage health-related needs will improve Outcome: Progressing   Problem: Clinical Measurements: Goal: Ability to maintain clinical measurements within normal limits will improve Outcome: Progressing Goal: Will remain free from infection Outcome: Progressing Goal: Diagnostic test results will improve Outcome: Not Progressing Goal: Respiratory complications will improve Outcome: Progressing Goal: Cardiovascular complication will be avoided Outcome: Progressing   Problem: Activity: Goal: Risk for activity intolerance will decrease Outcome: Progressing   Problem: Nutrition: Goal: Adequate nutrition will be maintained Outcome: Progressing   Problem: Coping: Goal: Level of anxiety will decrease Outcome: Progressing   Problem: Elimination: Goal: Will not experience complications related to bowel motility Outcome: Progressing Goal: Will not experience complications related to urinary retention Outcome: Progressing   Problem: Safety: Goal: Ability to remain free from injury will improve Outcome: Progressing

## 2020-10-31 NOTE — Progress Notes (Signed)
HEMATOLOGY-ONCOLOGY PROGRESS NOTE  SUBJECTIVE:   Patient seen with her daughter at bedside. Was a little lightheaded and feeling off due to anemia hgb 6.5 this AM. Feeling better post PRBC . PLT 8k. No bleeding issues. No further nosebleeds or gum bleeds. Notes improved appetite with steroids.   REVIEW OF SYSTEMS:   10 Point review of Systems was done is negative except as noted above.  I have reviewed the past medical history, past surgical history, social history and family history with the patient and they are unchanged from previous note.   PHYSICAL EXAMINATION: ECOG PERFORMANCE STATUS: 2 - Symptomatic, <50% confined to bed  Vitals:   10/31/20 1036 10/31/20 1208  BP:  (!) 104/57  Pulse:  100  Resp: (!) 21 (!) 22  Temp:  99.1 F (37.3 C)  SpO2: 96% 93%   Filed Weights   10/29/20 0125  Weight: 166 lb 14.2 oz (75.7 kg)    Intake/Output from previous day: 05/20 0701 - 05/21 0700 In: 1240.4 [P.O.:480; I.V.:202.4; Blood:558] Out: 68 [Urine:775; Stool:1]   GENERAL:alert, in no acute distress and comfortable SKIN: no acute rashes, no significant lesions EYES: conjunctiva are pink and non-injected, sclera anicteric OROPHARYNX: MMM, no exudates, no oropharyngeal erythema or ulceration NECK: supple, no JVD LYMPH:  palpable lymphadenopathy in the cervical, supraclavicular LNadenopathy  LUNGS: clear to auscultation b/l with normal respiratory effort HEART: regular rate & rhythm ABDOMEN:  normoactive bowel sounds , non tender, not distended. Extremity: no pedal edema PSYCH: alert & oriented x 3 with fluent speech NEURO: no focal motor/sensory deficits   LABORATORY DATA:  I have reviewed the data as listed CMP Latest Ref Rng & Units 10/29/2020 10/28/2020 10/28/2020  Glucose 70 - 99 mg/dL 116(H) 199(H) 167(H)  BUN 8 - 23 mg/dL _0 Creatinine 0.44 - 1.00 mg/dL 0.77 0.87 0.77  Sodium 135 - 145 mmol/L 139 135 136  Potassium 3.5 - 5.1 mmol/L 3.4(L) 3.8 3.8  Chloride 98 -  111 mmol/L 108 104 103  CO2 22 - 32 mmol/L _1 Calcium 8.9 - 10.3 mg/dL 8.0(L) 8.5(L) 8.4  Total Protein 6.5 - 8.1 g/dL 7.8 8.6(H) 8.2  Total Bilirubin 0.3 - 1.2 mg/dL 0.9 0.9 0.8  Alkaline Phos 38 - 126 U/L 40 42 48  AST 15 - 41 U/L _2 ALT 0 - 44 U/L _3 Lab Results  Component Value Date   WBC 12.8 (H) 10/31/2020   HGB 6.5 (LL) 10/31/2020   HCT 22.0 (L) 10/31/2020   MCV 94.4 10/31/2020   PLT 8 (LL) 10/31/2020   NEUTROABS 5.2 10/30/2020    CT ABDOMEN PELVIS WO CONTRAST  Result Date: 10/29/2020 CLINICAL DATA:  Hematologic malignancy, staging. EXAM: CT CHEST, ABDOMEN AND PELVIS WITHOUT CONTRAST TECHNIQUE: Multidetector CT imaging of the chest, abdomen and pelvis was performed following the standard protocol without IV contrast. COMPARISON:  History of MRI diarrhea neck no palpable status chest left at [facility] 7 right-sided than she thickened change ir indications sick concern for insert at the right 6 arm. FINDINGS: CT CHEST FINDINGS Cardiovascular: The aortic and branch vessel atherosclerosis without aneurysmal dilation. Normal size heart. No significant pericardial effusion/thickening. Mediastinum/Nodes: Enlarged supraclavicular, bilateral axillary and left mammary lymph nodes. For reference: Left internal mammary lymph node measures 1.9 by 1.2 cm on image 52/7. Right axillary lymph node measures 3.1 x 2.3 cm on image 36/7. Prominent mediastinal lymph nodes measuring up to 9 mm in short axis  on image 39/7, without adenopathy by size criteria. Thyroid is grossly unremarkable. Trachea esophagus are grossly unremarkable. Lungs/Pleura: Right lower lobe calcified granuloma. Bibasilar atelectasis. Scattered bilateral micro nodules measuring 1-2 mm for instance on image 96/4 in the left lower lobe and image 72/4 in the right upper lobe. Musculoskeletal: Peripherally calcified nodule in the right axilla on image 22/3. No acute osseous abnormality. CT ABDOMEN PELVIS FINDINGS  Hepatobiliary: Grossly unremarkable noncontrast appearance of the hepatic parenchyma. No hepatomegaly. Gallbladder is distended with some layering hyperdense material representing sludge and tiny stones seen on prior ultrasound. No biliary ductal dilation. Pancreas: Within normal limits. Spleen: Splenomegaly measuring 22 cm in maximum craniocaudal dimension. Adrenals/Urinary Tract: Adrenal glands are unremarkable. Kidneys are normal, without renal calculi, contour deforming lesion, or hydronephrosis. Bladder is unremarkable. Stomach/Bowel: Prior gastric bypass surgery. Enteric contrast visualized to the level of the distal small bowel. No pathologic dilation of small bowel. Appendix is grossly unremarkable. Colonic diverticulosis without findings of acute diverticulitis. Vascular/Lymphatic: Aortic atherosclerosis without aneurysmal dilation. No significant interval change in the enlarged and prominent upper abdominal, retroperitoneal and mesenteric lymph nodes. Interval increase in size of the few enlarged and prominent pelvic lymph nodes. Reference lymph nodes are as follows: Periportal lymph node measuring 3.3 x 1.7 cm on image 54/3, unchanged. Aortocaval lymph node measures 1.6 x 1.5 cm on image 69/3, unchanged. Right pelvic sidewall lymph node measures 3.1 x 1.6 cm on image 109/3. Reproductive: Uterus and bilateral adnexa are unremarkable. Other: Small volume abdominopelvic ascites with diffuse mesenteric stranding. Multiple peritoneal calcifications are again visualized and unchanged from prior, likely sequela prior inflammation. Similar appearance of the nonspecific anterior abdominal wall subcutaneous calcifications. Mild diffuse subcutaneous edema. Musculoskeletal: Multilevel degenerative changes spine. No acute osseous abnormality. IMPRESSION: 1. Splenomegaly with pathologically enlarged lymph nodes above and below the diaphragm, with overall stable to minimally increased abdominal adenopathy and interval  progression of the pelvic adenopathy. 2. Small volume abdominopelvic ascites with diffuse mesenteric stranding. 3. Scattered bilateral pulmonary micro nodules measuring 1-2 mm. 4. Distended gallbladder with some layering hyperdense material representing layering sludge and tiny stones seen on prior ultrasound. 5. Aortic atherosclerosis. Aortic Atherosclerosis (ICD10-I70.0). Electronically Signed   By: Dahlia Bailiff MD   On: 10/29/2020 19:53   CT CHEST WO CONTRAST  Result Date: 10/29/2020 CLINICAL DATA:  Hematologic malignancy, staging. EXAM: CT CHEST, ABDOMEN AND PELVIS WITHOUT CONTRAST TECHNIQUE: Multidetector CT imaging of the chest, abdomen and pelvis was performed following the standard protocol without IV contrast. COMPARISON:  History of MRI diarrhea neck no palpable status chest left at [facility] 7 right-sided than she thickened change ir indications sick concern for insert at the right 6 arm. FINDINGS: CT CHEST FINDINGS Cardiovascular: The aortic and branch vessel atherosclerosis without aneurysmal dilation. Normal size heart. No significant pericardial effusion/thickening. Mediastinum/Nodes: Enlarged supraclavicular, bilateral axillary and left mammary lymph nodes. For reference: Left internal mammary lymph node measures 1.9 by 1.2 cm on image 52/7. Right axillary lymph node measures 3.1 x 2.3 cm on image 36/7. Prominent mediastinal lymph nodes measuring up to 9 mm in short axis on image 39/7, without adenopathy by size criteria. Thyroid is grossly unremarkable. Trachea esophagus are grossly unremarkable. Lungs/Pleura: Right lower lobe calcified granuloma. Bibasilar atelectasis. Scattered bilateral micro nodules measuring 1-2 mm for instance on image 96/4 in the left lower lobe and image 72/4 in the right upper lobe. Musculoskeletal: Peripherally calcified nodule in the right axilla on image 22/3. No acute osseous abnormality. CT ABDOMEN PELVIS FINDINGS Hepatobiliary: Grossly unremarkable  noncontrast  appearance of the hepatic parenchyma. No hepatomegaly. Gallbladder is distended with some layering hyperdense material representing sludge and tiny stones seen on prior ultrasound. No biliary ductal dilation. Pancreas: Within normal limits. Spleen: Splenomegaly measuring 22 cm in maximum craniocaudal dimension. Adrenals/Urinary Tract: Adrenal glands are unremarkable. Kidneys are normal, without renal calculi, contour deforming lesion, or hydronephrosis. Bladder is unremarkable. Stomach/Bowel: Prior gastric bypass surgery. Enteric contrast visualized to the level of the distal small bowel. No pathologic dilation of small bowel. Appendix is grossly unremarkable. Colonic diverticulosis without findings of acute diverticulitis. Vascular/Lymphatic: Aortic atherosclerosis without aneurysmal dilation. No significant interval change in the enlarged and prominent upper abdominal, retroperitoneal and mesenteric lymph nodes. Interval increase in size of the few enlarged and prominent pelvic lymph nodes. Reference lymph nodes are as follows: Periportal lymph node measuring 3.3 x 1.7 cm on image 54/3, unchanged. Aortocaval lymph node measures 1.6 x 1.5 cm on image 69/3, unchanged. Right pelvic sidewall lymph node measures 3.1 x 1.6 cm on image 109/3. Reproductive: Uterus and bilateral adnexa are unremarkable. Other: Small volume abdominopelvic ascites with diffuse mesenteric stranding. Multiple peritoneal calcifications are again visualized and unchanged from prior, likely sequela prior inflammation. Similar appearance of the nonspecific anterior abdominal wall subcutaneous calcifications. Mild diffuse subcutaneous edema. Musculoskeletal: Multilevel degenerative changes spine. No acute osseous abnormality. IMPRESSION: 1. Splenomegaly with pathologically enlarged lymph nodes above and below the diaphragm, with overall stable to minimally increased abdominal adenopathy and interval progression of the pelvic adenopathy. 2. Small  volume abdominopelvic ascites with diffuse mesenteric stranding. 3. Scattered bilateral pulmonary micro nodules measuring 1-2 mm. 4. Distended gallbladder with some layering hyperdense material representing layering sludge and tiny stones seen on prior ultrasound. 5. Aortic atherosclerosis. Aortic Atherosclerosis (ICD10-I70.0). Electronically Signed   By: Dahlia Bailiff MD   On: 10/29/2020 19:53   CT BIOPSY  Result Date: 10/30/2020 INDICATION: 70 year old female presents for ultrasound-guided right axillary node biopsy EXAM: ULTRASOUND-GUIDED RIGHT AXILLARY NODE BIOPSY MEDICATIONS: None. ANESTHESIA/SEDATION: Moderate (conscious) sedation was employed during this procedure, as the same moderate sedation that was performed during the accession number [id number]. Same sedation time for sequential procedures. A total of Versed 3.0 mg and Fentanyl 125 mcg was administered intravenously. Moderate Sedation Time: 42 minutes. The patient's level of consciousness and vital signs were monitored continuously by radiology nursing throughout the procedure under my direct supervision. FLUOROSCOPY TIME:  ULTRASOUND COMPLICATIONS: NONE PROCEDURE: Informed written consent was obtained from the patient after a thorough discussion of the procedural risks, benefits and alternatives. All questions were addressed. Maximal Sterile Barrier Technique was utilized including caps, mask, sterile gowns, sterile gloves, sterile drape, hand hygiene and skin antiseptic. A timeout was performed prior to the initiation of the procedure. After a bone marrow biopsy was performed under CT guidance, the patient was turned into the supine position on her stretcher. Ultrasound images of the right axillary region were performed with images stored. The right axillary region was prepped with chlorhexidine in a sterile fashion, and a sterile drape was applied covering the operative field. A sterile gown and sterile gloves were used for the procedure.  Local anesthesia was provided with 1% Lidocaine. Ultrasound guidance was used to infiltrate the region with 1% lidocaine for local anesthesia. Five separate 18 gauge core biopsy were then acquired of the right axillary node using ultrasound guidance. Specimens placed into fresh specimen/saline. Patient tolerated the procedure well and remained hemodynamically stable throughout. No complications were encountered and no significant blood loss was encounter IMPRESSION: Status post  ultrasound-guided biopsy of right axillary lymph node. Signed, Dulcy Fanny. Dellia Nims, RPVI [facility] Electronically Signed   By: Corrie Mckusick D.O.   On: 10/30/2020 11:05   CT BONE MARROW BIOPSY & ASPIRATION  Result Date: 10/30/2020 INDICATION: 70 year old female referred for bone marrow biopsy EXAM: CT BONE MARROW BIOPSY AND ASPIRATION MEDICATIONS: None. ANESTHESIA/SEDATION: Moderate (conscious) sedation was employed during this procedure. A total of Versed 3.0 mg and Fentanyl 125 mcg was administered intravenously. Moderate Sedation Time: 42 minutes. The patient's level of consciousness and vital signs were monitored continuously by radiology nursing throughout the procedure under my direct supervision. FLUOROSCOPY TIME:  CT COMPLICATIONS: None PROCEDURE: The procedure risks, benefits, and alternatives were explained to the patient. Questions regarding the procedure were encouraged and answered. The patient understands and consents to the procedure. Scout CT of the pelvis was performed for surgical planning purposes. The posterior pelvis was prepped with Chlorhexidine in a sterile fashion, and a sterile drape was applied covering the operative field. A sterile gown and sterile gloves were used for the procedure. Local anesthesia was provided with 1% Lidocaine. Posterior right iliac bone was targeted for biopsy. The skin and subcutaneous tissues were infiltrated with 1% lidocaine  without epinephrine. A small stab incision was made with an 11 blade scalpel, and an 11 gauge Murphy needle was advanced with CT guidance to the posterior cortex. Manual forced was used to advance the needle through the posterior cortex and the stylet was removed. A bone marrow aspirate was retrieved and passed to a cytotechnologist in the room. The Murphy needle was then advanced without the stylet for a core biopsy. The core biopsy was retrieved and also passed to a cytotechnologist. Manual pressure was used for hemostasis and a sterile dressing was placed. No complications were encountered no significant blood loss was encountered. Patient tolerated the procedure well and remained hemodynamically stable throughout. After the bone marrow biopsy was completed, the patient was then positioned supine on her stretcher and we proceeded with the next staff of the planned case, a right axillary lymph node ultrasound-guided biopsy. The same sedation time is applied for both procedures. IMPRESSION: Status post CT-guided bone marrow biopsy, with tissue specimen sent to pathology for complete histopathologic analysis Signed, Dulcy Fanny. Earleen Newport, DO [facility] Radiology Electronically Signed   By: Corrie Mckusick D.O.   On: 10/30/2020 11:02   US Abdomen Limited RUQ (LIVER/GB)  Result Date: 10/28/2020 CLINICAL DATA:  Follow-up cirrhosis EXAM: ULTRASOUND ABDOMEN LIMITED RIGHT UPPER QUADRANT COMPARISON:  09/05/2019 FINDINGS: Gallbladder: Gallbladder is well distended with gallbladder sludge within. Previously seen calculi are noted within the dependent sludge. No wall thickening or pericholecystic fluid is noted. Negative sonographic Murphy's sign is elicited. Common bile duct: Diameter: 2.3 mm. Liver: No focal lesion identified. Within normal limits in parenchymal echogenicity. Portal vein is patent on color Doppler imaging with normal direction of blood flow towards the liver.  Other: None. IMPRESSION: Gallbladder sludge and cholelithiasis. No complicating factors are noted. Electronically Signed   By: Inez Catalina M.D.   On: 10/28/2020 23:34    ASSESSMENT AND PLAN:  Ms. Deemer is a 70 year old female with history of ITP and lymphadenopathy who presented to the hospital with severe thrombocytopenia and anemia.  1. Severe thrombocytopenia 2. Normocytic anemia 3. Lymphadenopathy and splenomegaly concerning for neoplastic process - ? Low grade NHL vs other etiology like sarcoidosis. 4. Type 2 diabetes mellitus with polyneuropathy 5.  Hypertension PLAN -  labs reviewed today -continue to transfuse PRBC for hgb<7.5 or if symptomatic -transfuse platelets for PLT count <10k or if bleeding -BM Bx and axillary LN biopsy results pending -- will likely be available on Monday -answered patients and her daughters questions at bedside -received IVIG,prednisone -=continue Nplate -- received 1st dose @ 93mg/kg on 5.20-- continue weekly -optimize po intake  GSullivan Lone 10/31/2020 2:12 PM

## 2020-10-31 NOTE — Progress Notes (Signed)
Spoke to Dr. Tonie Griffith concerning Blood Bank calling and stating that the blood isn't fully compatible however this is the closest and the Medical Director has said it will be fine to give but Dr. Tonie Griffith will need to agree.    Dr. Tonie Griffith agreed patient is fine to receive the blood.  Blood bank was notified and patient agrees for transfusion.  Platelets will also be administered, tylenol, benadryl and Lasix are ordered for transfusion.

## 2020-10-31 NOTE — Plan of Care (Signed)
?  Problem: Clinical Measurements: ?Goal: Will remain free from infection ?Outcome: Progressing ?  ?

## 2020-11-01 DIAGNOSIS — R591 Generalized enlarged lymph nodes: Secondary | ICD-10-CM | POA: Diagnosis not present

## 2020-11-01 DIAGNOSIS — D693 Immune thrombocytopenic purpura: Secondary | ICD-10-CM | POA: Diagnosis not present

## 2020-11-01 DIAGNOSIS — D649 Anemia, unspecified: Secondary | ICD-10-CM | POA: Diagnosis not present

## 2020-11-01 LAB — BPAM PLATELET PHERESIS
Blood Product Expiration Date: 202205222359
ISSUE DATE / TIME: 202205210514
Unit Type and Rh: 5100

## 2020-11-01 LAB — CBC WITH DIFFERENTIAL/PLATELET
Abs Immature Granulocytes: 1.75 10*3/uL — ABNORMAL HIGH (ref 0.00–0.07)
Basophils Absolute: 0 10*3/uL (ref 0.0–0.1)
Basophils Relative: 0 %
Eosinophils Absolute: 0 10*3/uL (ref 0.0–0.5)
Eosinophils Relative: 0 %
HCT: 25.8 % — ABNORMAL LOW (ref 36.0–46.0)
Hemoglobin: 7.9 g/dL — ABNORMAL LOW (ref 12.0–15.0)
Immature Granulocytes: 16 %
Lymphocytes Relative: 9 %
Lymphs Abs: 0.9 10*3/uL (ref 0.7–4.0)
MCH: 28.3 pg (ref 26.0–34.0)
MCHC: 30.6 g/dL (ref 30.0–36.0)
MCV: 92.5 fL (ref 80.0–100.0)
Monocytes Absolute: 2.2 10*3/uL — ABNORMAL HIGH (ref 0.1–1.0)
Monocytes Relative: 20 %
Neutro Abs: 5.8 10*3/uL (ref 1.7–7.7)
Neutrophils Relative %: 55 %
Platelets: 11 10*3/uL — CL (ref 150–400)
RBC: 2.79 MIL/uL — ABNORMAL LOW (ref 3.87–5.11)
RDW: 18.7 % — ABNORMAL HIGH (ref 11.5–15.5)
WBC: 10.7 10*3/uL — ABNORMAL HIGH (ref 4.0–10.5)
nRBC: 0.7 % — ABNORMAL HIGH (ref 0.0–0.2)

## 2020-11-01 LAB — GLUCOSE, CAPILLARY
Glucose-Capillary: 70 mg/dL (ref 70–99)
Glucose-Capillary: 230 mg/dL — ABNORMAL HIGH (ref 70–99)
Glucose-Capillary: 329 mg/dL — ABNORMAL HIGH (ref 70–99)
Glucose-Capillary: 202 mg/dL — ABNORMAL HIGH (ref 70–99)

## 2020-11-01 LAB — PREPARE PLATELET PHERESIS: Unit division: 0

## 2020-11-01 NOTE — Plan of Care (Signed)
  Problem: Clinical Measurements: Goal: Ability to maintain clinical measurements within normal limits will improve Outcome: Progressing   

## 2020-11-01 NOTE — Progress Notes (Signed)
[facility] PROGRESS NOTE    Progress Note  Kathleen Gibson  VEH:[id number] DOB: [birth year] DOA: 10/28/2020 PCP: [name], DO     Brief Narrative:   MIOSOTIS WETSEL is an 70 y.o. female past medical history of diabetes mellitus type 2, diabetic neuropathy, ITP diagnosed in 2014 and followed by Dr. Annamaria Boots as an outpatient, medical noncompliance, gastric bypass in 2000 essential hypertension pernicious anemia due to B12 deficiency nonalcoholic fatty liver disease comes into the [facility] due to severe thrombocytopenia, she relates she about 8 weeks ago she and her family got sick and since then she has been feeling down.  She relates poor appetite and severe malaise started developing some nosebleeds which was able to control with compression maneuvers.  Her platelet count in the ED was 5 she is transfuse 1 unit of packed platelets hematology was consulted.    Significant Events: 10/28/2020 1 unit of packed platelets  Significant studies: 10/28/2020 abdominal ultrasound gallbladder sludge no acute cholecystitis  Antibiotics: None   Assessment/Plan:   Acute thrombocytopenia: Bone marrow biopsy and axillary lymph node biopsy performed on 10/31/2020, will likely have results on Monday. Status post 4 units of platelets. Received IVIG and prednisone. Appreciate oncology's assistance. Continue Nplate per hematology oncologist.  Diffuse lymphadenopathy: Concerning for lymphoma, LDH was low. IR was consulted and performed a bone marrow biopsy on 10/30/2020.  Acute blood loss anemia/Normocytic anemia: She is being transfused 1 unit of packed red blood cells, she was premedicated with Benadryl and Tylenol. Ferritin is low we will start her on oral ferrous sulfate as an outpatient. CBC this morning is pending.  Mild leukocytosis: She has remained afebrile likely due to steroids, will monitor fever curve.  Type 2 diabetes mellitus with diabetic polyneuropathy, without  long-term current use of insulin  Hold home oral hypoglycemic agents. A1C 5.9.  She is currently on sliding scale insulin meal coverage blood glucose fairly controlled.  Diabetic polyneuropathy associated with type 2 diabetes mellitus (HCC) Continue gabapentin.  Essential hypertension: Continue metoprolol.  B12 deficiency Her B12 is 4000 we will hold on on repletion    History of noncompliance with medical treatment She has been counseled.  She has been counseled  Abdominal lymphadenopathy Further work-up as an outpatient.    DVT prophylaxis: SCD Family Communication:none Status is: Observation  The patient will require care spanning > 2 midnights and should be moved to inpatient because: Hemodynamically unstable  Dispo: The patient is from: Home              Anticipated d/c is to: Home              Patient currently is not medically stable to d/c.   Difficult to place patient No    Code Status:     Code Status Orders  (From admission, onward)         Start     Ordered   10/28/20 2337  Do not attempt resuscitation (DNR)  Continuous       Question Answer Comment  In the event of cardiac or respiratory ARREST Do not call a "code blue"   In the event of cardiac or respiratory ARREST Do not perform Intubation, CPR, defibrillation or ACLS   In the event of cardiac or respiratory ARREST Use medication by any route, position, wound care, and other measures to relive pain and suffering. May use oxygen, suction and manual treatment of airway obstruction as needed for comfort.  10/28/20 2336        Code Status History    Date Active Date Inactive Code Status Order ID Comments User Context   09/06/2019 0407 09/07/2019 1751 Full Code [id number]  [name], MD Inpatient   Advance Care Planning Activity        IV Access:    Peripheral IV   Procedures and diagnostic studies:   CT BIOPSY  Result Date: 10/30/2020 INDICATION: 70 year old female presents  for ultrasound-guided right axillary node biopsy EXAM: ULTRASOUND-GUIDED RIGHT AXILLARY NODE BIOPSY MEDICATIONS: None. ANESTHESIA/SEDATION: Moderate (conscious) sedation was employed during this procedure, as the same moderate sedation that was performed during the accession number [id number]. Same sedation time for sequential procedures. A total of Versed 3.0 mg and Fentanyl 125 mcg was administered intravenously. Moderate Sedation Time: 42 minutes. The patient's level of consciousness and vital signs were monitored continuously by radiology nursing throughout the procedure under my direct supervision. FLUOROSCOPY TIME:  ULTRASOUND COMPLICATIONS: NONE PROCEDURE: Informed written consent was obtained from the patient after a thorough discussion of the procedural risks, benefits and alternatives. All questions were addressed. Maximal Sterile Barrier Technique was utilized including caps, mask, sterile gowns, sterile gloves, sterile drape, hand hygiene and skin antiseptic. A timeout was performed prior to the initiation of the procedure. After a bone marrow biopsy was performed under CT guidance, the patient was turned into the supine position on her stretcher. Ultrasound images of the right axillary region were performed with images stored. The right axillary region was prepped with chlorhexidine in a sterile fashion, and a sterile drape was applied covering the operative field. A sterile gown and sterile gloves were used for the procedure. Local anesthesia was provided with 1% Lidocaine. Ultrasound guidance was used to infiltrate the region with 1% lidocaine for local anesthesia. Five separate 18 gauge core biopsy were then acquired of the right axillary node using ultrasound guidance. Specimens placed into fresh specimen/saline. Patient tolerated the procedure well and remained hemodynamically stable throughout. No complications were encountered and no significant blood loss was encounter IMPRESSION: Status post  ultrasound-guided biopsy of right axillary lymph node. Signed, Dulcy Fanny. Dellia Nims, RPVI [facility] Electronically Signed   By: Corrie Mckusick D.O.   On: 10/30/2020 11:05   CT BONE MARROW BIOPSY & ASPIRATION  Result Date: 10/30/2020 INDICATION: 70 year old female referred for bone marrow biopsy EXAM: CT BONE MARROW BIOPSY AND ASPIRATION MEDICATIONS: None. ANESTHESIA/SEDATION: Moderate (conscious) sedation was employed during this procedure. A total of Versed 3.0 mg and Fentanyl 125 mcg was administered intravenously. Moderate Sedation Time: 42 minutes. The patient's level of consciousness and vital signs were monitored continuously by radiology nursing throughout the procedure under my direct supervision. FLUOROSCOPY TIME:  CT COMPLICATIONS: None PROCEDURE: The procedure risks, benefits, and alternatives were explained to the patient. Questions regarding the procedure were encouraged and answered. The patient understands and consents to the procedure. Scout CT of the pelvis was performed for surgical planning purposes. The posterior pelvis was prepped with Chlorhexidine in a sterile fashion, and a sterile drape was applied covering the operative field. A sterile gown and sterile gloves were used for the procedure. Local anesthesia was provided with 1% Lidocaine. Posterior right iliac bone was targeted for biopsy. The skin and subcutaneous tissues were infiltrated with 1% lidocaine without epinephrine. A small stab incision was made with an 11 blade scalpel, and an 11 gauge Murphy needle was advanced with CT guidance to the posterior cortex. Manual forced was  used to advance the needle through the posterior cortex and the stylet was removed. A bone marrow aspirate was retrieved and passed to a cytotechnologist in the room. The Murphy needle was then advanced without the stylet for a core biopsy. The core biopsy was retrieved and also passed to a  cytotechnologist. Manual pressure was used for hemostasis and a sterile dressing was placed. No complications were encountered no significant blood loss was encountered. Patient tolerated the procedure well and remained hemodynamically stable throughout. After the bone marrow biopsy was completed, the patient was then positioned supine on her stretcher and we proceeded with the next staff of the planned case, a right axillary lymph node ultrasound-guided biopsy. The same sedation time is applied for both procedures. IMPRESSION: Status post CT-guided bone marrow biopsy, with tissue specimen sent to pathology for complete histopathologic analysis Signed, Dulcy Fanny. Earleen Newport, DO [facility] Radiology Electronically Signed   By: Corrie Mckusick D.O.   On: 10/30/2020 11:02     Medical Consultants:    None.   Subjective:    ZALAYA ASTARITA no complaints today, denies regular bowel movements no melanotic stools or bright red blood per rectum.  Objective:    Vitals:   10/31/20 1632 10/31/20 2044 11/01/20 0612 11/01/20 0900  BP: (!) 93/52 114/62 118/66 (!) 119/56  Pulse: 84 81 85 96  Resp: _0 Temp: 98.7 F (37.1 C) (!) 97.5 F (36.4 C) (!) 97.5 F (36.4 C) 98.2 F (36.8 C)  TempSrc: Oral Oral Oral Oral  SpO2: 98% 97% 94% 99%  Weight:  77.3 kg    Height:       SpO2: 99 % O2 Flow Rate (L/min): 0 L/min   Intake/Output Summary (Last 24 hours) at 11/01/2020 0936 Last data filed at 11/01/2020 0600 Gross per 24 hour  Intake 784 ml  Output 251 ml  Net 533 ml   Filed Weights   10/29/20 0125 10/31/20 2044  Weight: 75.7 kg 77.3 kg    Exam: General exam: In no acute distress. Respiratory system: Good air movement and clear to auscultation. Cardiovascular system: S1 & S2 heard, RRR. No JVD. Gastrointestinal system: Abdomen is nondistended, soft and nontender.  Extremities: No pedal edema. Skin: No rashes, lesions or ulcers Psychiatry:  Judgement and insight appear normal. Mood & affect appropriate.  Data Reviewed:    Labs: Basic Metabolic Panel: Recent Labs  Lab 10/28/20 0952 10/28/20 1806 10/29/20 0734  NA 136 135 139  K 3.8 3.8 3.4*  CL 103 104 108  CO2 _1 GLUCOSE 167* 199* 116*  BUN _2 CREATININE 0.77 0.87 0.77  CALCIUM 8.4 8.5* 8.0*  MG  --   --  1.8   GFR Estimated Creatinine Clearance: 65.4 mL/min (by C-G formula based on SCr of 0.77 mg/dL). Liver Function Tests: Recent Labs  Lab 10/28/20 0952 10/28/20 1806 10/29/20 0734  AST _3 ALT _4 ALKPHOS 48 42 40  BILITOT 0.8 0.9 0.9  PROT 8.2 8.6* 7.8  ALBUMIN 3.4* 3.1* 2.9*   No results for input(s): LIPASE, AMYLASE in the last 168 hours. No results for input(s): AMMONIA in the last 168 hours. Coagulation profile Recent Labs  Lab 10/28/20 1806  INR 1.2   COVID-19 Labs  No results for input(s): DDIMER, FERRITIN, LDH, CRP in the last 72 hours.  Lab Results  Component Value Date   SARSCOV2NAA NEGATIVE 10/28/2020   [place]  NEGATIVE 09/06/2019   [place] NEGATIVE 09/04/2019    CBC: Recent Labs  Lab 10/28/20 0952 10/28/20 1806 10/29/20 0734 10/30/20 0545 10/31/20 0217  WBC 9.2 10.5 8.5 10.0 12.8*  NEUTROABS 6.7 6.1 6.5 5.2  --   HGB 8.9 Repeated and verified X2.* 8.3* 8.1* 7.6* 6.5*  HCT 26.8* 27.4* 27.0* 26.3* 22.0*  MCV 86.8 93.8 92.5 96.0 94.4  PLT 4.0 Repeated and verified X2.* <5* <5* <5* 8*   Cardiac Enzymes: No results for input(s): CKTOTAL, CKMB, CKMBINDEX, TROPONINI in the last 168 hours. BNP (last 3 results) No results for input(s): PROBNP in the last 8760 hours. CBG: Recent Labs  Lab 10/31/20 0702 10/31/20 1132 10/31/20 1631 10/31/20 2046 11/01/20 0719  GLUCAP 109* 259* 295* 301* 70   D-Dimer: No results for input(s): DDIMER in the last 72 hours. Hgb A1c: No results for input(s): HGBA1C in the last 72 hours. Lipid Profile: No results for input(s): CHOL, HDL, LDLCALC, TRIG,  CHOLHDL, LDLDIRECT in the last 72 hours. Thyroid function studies: No results for input(s): TSH, T4TOTAL, T3FREE, THYROIDAB in the last 72 hours.  Invalid input(s): FREET3 Anemia work up: No results for input(s): VITAMINB12, FOLATE, FERRITIN, TIBC, IRON, RETICCTPCT in the last 72 hours. Sepsis Labs: Recent Labs  Lab 10/28/20 1806 10/29/20 0734 10/30/20 0545 10/31/20 0217  WBC 10.5 8.5 10.0 12.8*   Microbiology Recent Results (from the past 240 hour(s))  Resp Panel by RT-PCR (Flu A&B, Covid) Nasopharyngeal Swab     Status: None   Collection Time: 10/28/20  6:07 PM   Specimen: Nasopharyngeal Swab; Nasopharyngeal(NP) swabs in vial transport medium  Result Value Ref Range Status   SARS Coronavirus 2 by RT PCR NEGATIVE NEGATIVE Final    Comment: (NOTE) SARS-CoV-2 target nucleic acids are NOT DETECTED.  The SARS-CoV-2 RNA is generally detectable in upper respiratory specimens during the acute phase of infection. The lowest concentration of SARS-CoV-2 viral copies this assay can detect is 138 copies/mL. A negative result does not preclude SARS-Cov-2 infection and should not be used as the sole basis for treatment or other patient management decisions. A negative result may occur with  improper specimen collection/handling, submission of specimen other than nasopharyngeal swab, presence of viral mutation(s) within the areas targeted by this assay, and inadequate number of viral copies(<138 copies/mL). A negative result must be combined with clinical observations, patient history, and epidemiological information. The expected result is Negative.  Fact Sheet for Patients:  EntrepreneurPulse.com.au  Fact Sheet for Healthcare Providers:  IncredibleEmployment.be  This test is no t yet approved or cleared by the [place] FDA and  has been authorized for detection and/or diagnosis of SARS-CoV-2 by FDA under an Emergency Use Authorization (EUA).  This EUA will remain  in effect (meaning this test can be used) for the duration of the COVID-19 declaration under Section 564(b)(1) of the Act, 21 U.S.C.section 360bbb-3(b)(1), unless the authorization is terminated  or revoked sooner.       Influenza A by PCR NEGATIVE NEGATIVE Final   Influenza B by PCR NEGATIVE NEGATIVE Final    Comment: (NOTE) The Xpert Xpress SARS-CoV-2/FLU/RSV plus assay is intended as an aid in the diagnosis of influenza from Nasopharyngeal swab specimens and should not be used as a sole basis for treatment. Nasal washings and aspirates are unacceptable for Xpert Xpress SARS-CoV-2/FLU/RSV testing.  Fact Sheet for Patients: EntrepreneurPulse.com.au  Fact Sheet for Healthcare Providers: IncredibleEmployment.be  This test is not yet approved or cleared by the [place] FDA and has  been authorized for detection and/or diagnosis of SARS-CoV-2 by FDA under an Emergency Use Authorization (EUA). This EUA will remain in effect (meaning this test can be used) for the duration of the COVID-19 declaration under Section 564(b)(1) of the Act, 21 U.S.C. section 360bbb-3(b)(1), unless the authorization is terminated or revoked.  Performed at [facility], [place] [address], [place], [place] [address]      Medications:   . acetaminophen  650 mg Oral Once  . folic acid  1 mg Oral Daily  . gabapentin  800 mg Oral TID  . insulin aspart  0-15 Units Subcutaneous TID AC & HS  . metoprolol succinate  50 mg Oral Daily  . mirtazapine  15 mg Oral QHS  . potassium chloride  20 mEq Oral BID  . predniSONE  60 mg Oral Q breakfast   Continuous Infusions:     LOS: 3 days   [facility]  11/01/2020, 9:36 AM

## 2020-11-01 NOTE — Progress Notes (Signed)
Hematology Short note  Chart and labs reviewed.  . CBC Latest Ref Rng & Units 11/01/2020 10/31/2020 10/30/2020  WBC 4.0 - 10.5 K/uL 10.7(H) 12.8(H) 10.0  Hemoglobin 12.0 - 15.0 g/dL 7.9(L) 6.5(LL) 7.6(L)  Hematocrit 36.0 - 46.0 % 25.8(L) 22.0(L) 26.3(L)  Platelets 150 - 400 K/uL 11(LL) 8(LL) <5(LL)   PLT improving to 11K Hgb up to 7.9 post transfusion. Would recommend additional nplate dose tommorow@ 10mcg/kg if platelets<20K and adjust weekly dose cumulatively accordingly. We hope to have the pathology results tomorrow to direct definitively management. Dr Burr Medico Rayna Sexton Curcio shall followup tomorrow. Plz call me today if any other acute questions arise.  Sullivan Lone MD

## 2020-11-02 DIAGNOSIS — R591 Generalized enlarged lymph nodes: Secondary | ICD-10-CM | POA: Diagnosis not present

## 2020-11-02 DIAGNOSIS — D649 Anemia, unspecified: Secondary | ICD-10-CM | POA: Diagnosis not present

## 2020-11-02 DIAGNOSIS — D693 Immune thrombocytopenic purpura: Secondary | ICD-10-CM | POA: Diagnosis not present

## 2020-11-02 LAB — CBC
HCT: 25.7 % — ABNORMAL LOW (ref 36.0–46.0)
Hemoglobin: 7.8 g/dL — ABNORMAL LOW (ref 12.0–15.0)
MCH: 28.6 pg (ref 26.0–34.0)
MCHC: 30.4 g/dL (ref 30.0–36.0)
MCV: 94.1 fL (ref 80.0–100.0)
Platelets: 15 10*3/uL — CL (ref 150–400)
RBC: 2.73 MIL/uL — ABNORMAL LOW (ref 3.87–5.11)
RDW: 18.9 % — ABNORMAL HIGH (ref 11.5–15.5)
WBC: 11.5 10*3/uL — ABNORMAL HIGH (ref 4.0–10.5)
nRBC: 0.9 % — ABNORMAL HIGH (ref 0.0–0.2)

## 2020-11-02 LAB — BPAM RBC
Blood Product Expiration Date: 202206242359
Blood Product Expiration Date: 202206242359
ISSUE DATE / TIME: 202205210856
ISSUE DATE / TIME: 202205210911
Unit Type and Rh: 5100
Unit Type and Rh: 5100

## 2020-11-02 LAB — GLUCOSE, CAPILLARY
Glucose-Capillary: 135 mg/dL — ABNORMAL HIGH (ref 70–99)
Glucose-Capillary: 195 mg/dL — ABNORMAL HIGH (ref 70–99)
Glucose-Capillary: 296 mg/dL — ABNORMAL HIGH (ref 70–99)
Glucose-Capillary: 416 mg/dL — ABNORMAL HIGH (ref 70–99)
Glucose-Capillary: 93 mg/dL (ref 70–99)

## 2020-11-02 LAB — TYPE AND SCREEN
ABO/RH(D): O POS
Antibody Screen: POSITIVE
DAT, IgG: POSITIVE
Unit division: 0
Unit division: 0

## 2020-11-02 MED ORDER — INSULIN ASPART 100 UNIT/ML IJ SOLN
0.0000 [IU] | Freq: Every day | INTRAMUSCULAR | Status: DC
  Administered 2020-11-02: 3 [IU] via SUBCUTANEOUS
  Administered 2020-11-03: 4 [IU] via SUBCUTANEOUS
  Administered 2020-11-04: 3 [IU] via SUBCUTANEOUS
  Administered 2020-11-05 – 2020-11-06 (×2): 2 [IU] via SUBCUTANEOUS
  Administered 2020-11-08: 3 [IU] via SUBCUTANEOUS

## 2020-11-02 MED ORDER — INSULIN ASPART 100 UNIT/ML IJ SOLN
30.0000 [IU] | Freq: Once | INTRAMUSCULAR | Status: AC
  Administered 2020-11-02: 30 [IU] via SUBCUTANEOUS

## 2020-11-02 MED ORDER — INSULIN ASPART 100 UNIT/ML IJ SOLN
0.0000 [IU] | Freq: Three times a day (TID) | INTRAMUSCULAR | Status: DC
  Administered 2020-11-03: 2 [IU] via SUBCUTANEOUS
  Administered 2020-11-03: 15 [IU] via SUBCUTANEOUS
  Administered 2020-11-04 (×2): 5 [IU] via SUBCUTANEOUS
  Administered 2020-11-04: 15 [IU] via SUBCUTANEOUS
  Administered 2020-11-05: 11 [IU] via SUBCUTANEOUS
  Administered 2020-11-05: 3 [IU] via SUBCUTANEOUS
  Administered 2020-11-06: 11 [IU] via SUBCUTANEOUS
  Administered 2020-11-06: 5 [IU] via SUBCUTANEOUS
  Administered 2020-11-07: 3 [IU] via SUBCUTANEOUS
  Administered 2020-11-08: 11 [IU] via SUBCUTANEOUS
  Administered 2020-11-08: 3 [IU] via SUBCUTANEOUS

## 2020-11-02 MED ORDER — INSULIN GLARGINE 100 UNIT/ML ~~LOC~~ SOLN
30.0000 [IU] | Freq: Two times a day (BID) | SUBCUTANEOUS | Status: DC
Start: 2020-11-02 — End: 2020-11-03
  Administered 2020-11-02 – 2020-11-03 (×2): 30 [IU] via SUBCUTANEOUS
  Filled 2020-11-02 (×3): qty 0.3

## 2020-11-02 MED ORDER — INSULIN ASPART 100 UNIT/ML IJ SOLN
4.0000 [IU] | Freq: Three times a day (TID) | INTRAMUSCULAR | Status: DC
  Administered 2020-11-03 – 2020-11-04 (×5): 4 [IU] via SUBCUTANEOUS

## 2020-11-02 NOTE — Progress Notes (Addendum)
HEMATOLOGY-ONCOLOGY PROGRESS NOTE  SUBJECTIVE:   Resting quietly in bed this afternoon.  She denies bleeding.  Bone marrow biopsy and lymph node biopsy results pending.  REVIEW OF SYSTEMS:   10 Point review of Systems was done is negative except as noted above.  I have reviewed the past medical history, past surgical history, social history and family history with the patient and they are unchanged from previous note.   PHYSICAL EXAMINATION: ECOG PERFORMANCE STATUS: 2 - Symptomatic, <50% confined to bed  Vitals:   11/01/20 2027 11/02/20 0531  BP: 113/69 116/60  Pulse: 84 79  Resp: 18 17  Temp: 98 F (36.7 C) 98.3 F (36.8 C)  SpO2: 98% 98%   Filed Weights   10/29/20 0125 10/31/20 2044  Weight: 75.7 kg 77.3 kg    Intake/Output from previous day: 05/22 0701 - 05/23 0700 In: 840 [P.O.:840] Out: 0    GENERAL:alert, in no acute distress and comfortable SKIN: no acute rashes, no significant lesions EYES: conjunctiva are pink and non-injected, sclera anicteric OROPHARYNX: MMM, no exudates, no oropharyngeal erythema or ulceration NECK: supple, no JVD LYMPH: Palpable cervical, supraclavicular, axillary lymphadenopathy LUNGS: clear to auscultation b/l with normal respiratory effort HEART: regular rate & rhythm ABDOMEN:  normoactive bowel sounds , non tender, not distended. Extremity: no pedal edema PSYCH: alert & oriented x 3 with fluent speech NEURO: no focal motor/sensory deficits   LABORATORY DATA:  I have reviewed the data as listed CMP Latest Ref Rng & Units 10/29/2020 10/28/2020 10/28/2020  Glucose 70 - 99 mg/dL 116(H) 199(H) 167(H)  BUN 8 - 23 mg/dL $Remove'10 16 16  'YFaHxGO$ Creatinine 0.44 - 1.00 mg/dL 0.77 0.87 0.77  Sodium 135 - 145 mmol/L 139 135 136  Potassium 3.5 - 5.1 mmol/L 3.4(L) 3.8 3.8  Chloride 98 - 111 mmol/L 108 104 103  CO2 22 - 32 mmol/L $RemoveB'26 25 27  'HozJMkWk$ Calcium 8.9 - 10.3 mg/dL 8.0(L) 8.5(L) 8.4  Total Protein 6.5 - 8.1 g/dL 7.8 8.6(H) 8.2  Total Bilirubin 0.3 - 1.2  mg/dL 0.9 0.9 0.8  Alkaline Phos 38 - 126 U/L 40 42 48  AST 15 - 41 U/L $Remo'16 18 14  'SQSWg$ ALT 0 - 44 U/L $Remo'9 11 7    'ssNnL$ Lab Results  Component Value Date   WBC 11.5 (H) 11/02/2020   HGB 7.8 (L) 11/02/2020   HCT 25.7 (L) 11/02/2020   MCV 94.1 11/02/2020   PLT 15 (LL) 11/02/2020   NEUTROABS 5.8 11/01/2020    CT ABDOMEN PELVIS WO CONTRAST  Result Date: 10/29/2020 CLINICAL DATA:  Hematologic malignancy, staging. EXAM: CT CHEST, ABDOMEN AND PELVIS WITHOUT CONTRAST TECHNIQUE: Multidetector CT imaging of the chest, abdomen and pelvis was performed following the standard protocol without IV contrast. COMPARISON:  History of MRI diarrhea neck no palpable status chest left at [facility] 7 right-sided than she thickened change ir indications sick concern for insert at the right 6 arm. FINDINGS: CT CHEST FINDINGS Cardiovascular: The aortic and branch vessel atherosclerosis without aneurysmal dilation. Normal size heart. No significant pericardial effusion/thickening. Mediastinum/Nodes: Enlarged supraclavicular, bilateral axillary and left mammary lymph nodes. For reference: Left internal mammary lymph node measures 1.9 by 1.2 cm on image 52/7. Right axillary lymph node measures 3.1 x 2.3 cm on image 36/7. Prominent mediastinal lymph nodes measuring up to 9 mm in short axis on image 39/7, without adenopathy by size criteria. Thyroid is grossly unremarkable. Trachea esophagus are grossly unremarkable. Lungs/Pleura: Right lower lobe calcified granuloma. Bibasilar atelectasis. Scattered bilateral micro nodules measuring  1-2 mm for instance on image 96/4 in the left lower lobe and image 72/4 in the right upper lobe. Musculoskeletal: Peripherally calcified nodule in the right axilla on image 22/3. No acute osseous abnormality. CT ABDOMEN PELVIS FINDINGS Hepatobiliary: Grossly unremarkable noncontrast appearance of the hepatic parenchyma. No hepatomegaly. Gallbladder is distended with some layering hyperdense material representing  sludge and tiny stones seen on prior ultrasound. No biliary ductal dilation. Pancreas: Within normal limits. Spleen: Splenomegaly measuring 22 cm in maximum craniocaudal dimension. Adrenals/Urinary Tract: Adrenal glands are unremarkable. Kidneys are normal, without renal calculi, contour deforming lesion, or hydronephrosis. Bladder is unremarkable. Stomach/Bowel: Prior gastric bypass surgery. Enteric contrast visualized to the level of the distal small bowel. No pathologic dilation of small bowel. Appendix is grossly unremarkable. Colonic diverticulosis without findings of acute diverticulitis. Vascular/Lymphatic: Aortic atherosclerosis without aneurysmal dilation. No significant interval change in the enlarged and prominent upper abdominal, retroperitoneal and mesenteric lymph nodes. Interval increase in size of the few enlarged and prominent pelvic lymph nodes. Reference lymph nodes are as follows: Periportal lymph node measuring 3.3 x 1.7 cm on image 54/3, unchanged. Aortocaval lymph node measures 1.6 x 1.5 cm on image 69/3, unchanged. Right pelvic sidewall lymph node measures 3.1 x 1.6 cm on image 109/3. Reproductive: Uterus and bilateral adnexa are unremarkable. Other: Small volume abdominopelvic ascites with diffuse mesenteric stranding. Multiple peritoneal calcifications are again visualized and unchanged from prior, likely sequela prior inflammation. Similar appearance of the nonspecific anterior abdominal wall subcutaneous calcifications. Mild diffuse subcutaneous edema. Musculoskeletal: Multilevel degenerative changes spine. No acute osseous abnormality. IMPRESSION: 1. Splenomegaly with pathologically enlarged lymph nodes above and below the diaphragm, with overall stable to minimally increased abdominal adenopathy and interval progression of the pelvic adenopathy. 2. Small volume abdominopelvic ascites with diffuse mesenteric stranding. 3. Scattered bilateral pulmonary micro nodules measuring 1-2 mm. 4.  Distended gallbladder with some layering hyperdense material representing layering sludge and tiny stones seen on prior ultrasound. 5. Aortic atherosclerosis. Aortic Atherosclerosis (ICD10-I70.0). Electronically Signed   By: Dahlia Bailiff MD   On: 10/29/2020 19:53   CT CHEST WO CONTRAST  Result Date: 10/29/2020 CLINICAL DATA:  Hematologic malignancy, staging. EXAM: CT CHEST, ABDOMEN AND PELVIS WITHOUT CONTRAST TECHNIQUE: Multidetector CT imaging of the chest, abdomen and pelvis was performed following the standard protocol without IV contrast. COMPARISON:  History of MRI diarrhea neck no palpable status chest left at [facility] 7 right-sided than she thickened change ir indications sick concern for insert at the right 6 arm. FINDINGS: CT CHEST FINDINGS Cardiovascular: The aortic and branch vessel atherosclerosis without aneurysmal dilation. Normal size heart. No significant pericardial effusion/thickening. Mediastinum/Nodes: Enlarged supraclavicular, bilateral axillary and left mammary lymph nodes. For reference: Left internal mammary lymph node measures 1.9 by 1.2 cm on image 52/7. Right axillary lymph node measures 3.1 x 2.3 cm on image 36/7. Prominent mediastinal lymph nodes measuring up to 9 mm in short axis on image 39/7, without adenopathy by size criteria. Thyroid is grossly unremarkable. Trachea esophagus are grossly unremarkable. Lungs/Pleura: Right lower lobe calcified granuloma. Bibasilar atelectasis. Scattered bilateral micro nodules measuring 1-2 mm for instance on image 96/4 in the left lower lobe and image 72/4 in the right upper lobe. Musculoskeletal: Peripherally calcified nodule in the right axilla on image 22/3. No acute osseous abnormality. CT ABDOMEN PELVIS FINDINGS Hepatobiliary: Grossly unremarkable noncontrast appearance of the hepatic parenchyma. No hepatomegaly. Gallbladder is distended with some layering hyperdense material representing sludge and tiny stones seen on prior ultrasound. No  biliary ductal dilation.  Pancreas: Within normal limits. Spleen: Splenomegaly measuring 22 cm in maximum craniocaudal dimension. Adrenals/Urinary Tract: Adrenal glands are unremarkable. Kidneys are normal, without renal calculi, contour deforming lesion, or hydronephrosis. Bladder is unremarkable. Stomach/Bowel: Prior gastric bypass surgery. Enteric contrast visualized to the level of the distal small bowel. No pathologic dilation of small bowel. Appendix is grossly unremarkable. Colonic diverticulosis without findings of acute diverticulitis. Vascular/Lymphatic: Aortic atherosclerosis without aneurysmal dilation. No significant interval change in the enlarged and prominent upper abdominal, retroperitoneal and mesenteric lymph nodes. Interval increase in size of the few enlarged and prominent pelvic lymph nodes. Reference lymph nodes are as follows: Periportal lymph node measuring 3.3 x 1.7 cm on image 54/3, unchanged. Aortocaval lymph node measures 1.6 x 1.5 cm on image 69/3, unchanged. Right pelvic sidewall lymph node measures 3.1 x 1.6 cm on image 109/3. Reproductive: Uterus and bilateral adnexa are unremarkable. Other: Small volume abdominopelvic ascites with diffuse mesenteric stranding. Multiple peritoneal calcifications are again visualized and unchanged from prior, likely sequela prior inflammation. Similar appearance of the nonspecific anterior abdominal wall subcutaneous calcifications. Mild diffuse subcutaneous edema. Musculoskeletal: Multilevel degenerative changes spine. No acute osseous abnormality. IMPRESSION: 1. Splenomegaly with pathologically enlarged lymph nodes above and below the diaphragm, with overall stable to minimally increased abdominal adenopathy and interval progression of the pelvic adenopathy. 2. Small volume abdominopelvic ascites with diffuse mesenteric stranding. 3. Scattered bilateral pulmonary micro nodules measuring 1-2 mm. 4. Distended gallbladder with some layering hyperdense  material representing layering sludge and tiny stones seen on prior ultrasound. 5. Aortic atherosclerosis. Aortic Atherosclerosis (ICD10-I70.0). Electronically Signed   By: Dahlia Bailiff MD   On: 10/29/2020 19:53   CT BIOPSY  Result Date: 10/30/2020 INDICATION: 70 year old female presents for ultrasound-guided right axillary node biopsy EXAM: ULTRASOUND-GUIDED RIGHT AXILLARY NODE BIOPSY MEDICATIONS: None. ANESTHESIA/SEDATION: Moderate (conscious) sedation was employed during this procedure, as the same moderate sedation that was performed during the accession number [id number]. Same sedation time for sequential procedures. A total of Versed 3.0 mg and Fentanyl 125 mcg was administered intravenously. Moderate Sedation Time: 42 minutes. The patient's level of consciousness and vital signs were monitored continuously by radiology nursing throughout the procedure under my direct supervision. FLUOROSCOPY TIME:  ULTRASOUND COMPLICATIONS: NONE PROCEDURE: Informed written consent was obtained from the patient after a thorough discussion of the procedural risks, benefits and alternatives. All questions were addressed. Maximal Sterile Barrier Technique was utilized including caps, mask, sterile gowns, sterile gloves, sterile drape, hand hygiene and skin antiseptic. A timeout was performed prior to the initiation of the procedure. After a bone marrow biopsy was performed under CT guidance, the patient was turned into the supine position on her stretcher. Ultrasound images of the right axillary region were performed with images stored. The right axillary region was prepped with chlorhexidine in a sterile fashion, and a sterile drape was applied covering the operative field. A sterile gown and sterile gloves were used for the procedure. Local anesthesia was provided with 1% Lidocaine. Ultrasound guidance was used to infiltrate the region with 1% lidocaine for local anesthesia. Five separate 18 gauge core biopsy were then  acquired of the right axillary node using ultrasound guidance. Specimens placed into fresh specimen/saline. Patient tolerated the procedure well and remained hemodynamically stable throughout. No complications were encountered and no significant blood loss was encounter IMPRESSION: Status post ultrasound-guided biopsy of right axillary lymph node. Signed, Dulcy Fanny. Dellia Nims, [place] [facility] Radiology Electronically Signed   By: Corrie Mckusick D.O.  On: 10/30/2020 11:05   CT BONE MARROW BIOPSY & ASPIRATION  Result Date: 10/30/2020 INDICATION: 70 year old female referred for bone marrow biopsy EXAM: CT BONE MARROW BIOPSY AND ASPIRATION MEDICATIONS: None. ANESTHESIA/SEDATION: Moderate (conscious) sedation was employed during this procedure. A total of Versed 3.0 mg and Fentanyl 125 mcg was administered intravenously. Moderate Sedation Time: 42 minutes. The patient's level of consciousness and vital signs were monitored continuously by radiology nursing throughout the procedure under my direct supervision. FLUOROSCOPY TIME:  CT COMPLICATIONS: None PROCEDURE: The procedure risks, benefits, and alternatives were explained to the patient. Questions regarding the procedure were encouraged and answered. The patient understands and consents to the procedure. Scout CT of the pelvis was performed for surgical planning purposes. The posterior pelvis was prepped with Chlorhexidine in a sterile fashion, and a sterile drape was applied covering the operative field. A sterile gown and sterile gloves were used for the procedure. Local anesthesia was provided with 1% Lidocaine. Posterior right iliac bone was targeted for biopsy. The skin and subcutaneous tissues were infiltrated with 1% lidocaine without epinephrine. A small stab incision was made with an 11 blade scalpel, and an 11 gauge Murphy needle was advanced with CT guidance to the posterior cortex. Manual forced was used  to advance the needle through the posterior cortex and the stylet was removed. A bone marrow aspirate was retrieved and passed to a cytotechnologist in the room. The Murphy needle was then advanced without the stylet for a core biopsy. The core biopsy was retrieved and also passed to a cytotechnologist. Manual pressure was used for hemostasis and a sterile dressing was placed. No complications were encountered no significant blood loss was encountered. Patient tolerated the procedure well and remained hemodynamically stable throughout. After the bone marrow biopsy was completed, the patient was then positioned supine on her stretcher and we proceeded with the next staff of the planned case, a right axillary lymph node ultrasound-guided biopsy. The same sedation time is applied for both procedures. IMPRESSION: Status post CT-guided bone marrow biopsy, with tissue specimen sent to pathology for complete histopathologic analysis Signed, Dulcy Fanny. Earleen Newport, DO [facility] Radiology Electronically Signed   By: Corrie Mckusick D.O.   On: 10/30/2020 11:02   US Abdomen Limited RUQ (LIVER/GB)  Result Date: 10/28/2020 CLINICAL DATA:  Follow-up cirrhosis EXAM: ULTRASOUND ABDOMEN LIMITED RIGHT UPPER QUADRANT COMPARISON:  09/05/2019 FINDINGS: Gallbladder: Gallbladder is well distended with gallbladder sludge within. Previously seen calculi are noted within the dependent sludge. No wall thickening or pericholecystic fluid is noted. Negative sonographic Murphy's sign is elicited. Common bile duct: Diameter: 2.3 mm. Liver: No focal lesion identified. Within normal limits in parenchymal echogenicity. Portal vein is patent on color Doppler imaging with normal direction of blood flow towards the liver. Other: None. IMPRESSION: Gallbladder sludge and cholelithiasis. No complicating factors are noted. Electronically Signed   By: Inez Catalina M.D.   On: 10/28/2020 23:34    ASSESSMENT AND  PLAN:  Ms. Bobb is a 70 year old female with history of ITP and lymphadenopathy who presented to the hospital with severe thrombocytopenia and anemia.  1. Severe thrombocytopenia 2. Normocytic anemia 3. Lymphadenopathy and splenomegaly concerning for neoplastic process - ? Low grade NHL vs other etiology like sarcoidosis. 4. Type 2 diabetes mellitus with polyneuropathy 5.  Hypertension  PLAN -Status post IVIG and remains on prednisone.  Labs reviewed today -continue to transfuse PRBC for hgb<7.5 or if symptomatic -transfuse platelets for PLT count <10k or if bleeding -BM  Bx and axillary LN biopsy results pending --may be available later today or tomorrow. -Started on Nplate on 7/27.  We will plan to continue weekly.  Mikey Bussing  11/02/2020 10:11 AM   Addendum  I have seen the patient, examined her. I agree with the assessment and and plan and have edited the notes.   Her BM biopsy result is not back yet. I discussed with pathology today. Lab reviewed, plt slowly improving, will continue weekly Nplate, next due on Friday. Discussed with pt. Pathology lab did not see the lymph node biopsy sample, will track it down.   I will f/u on Wednesday, hopefully BM result will be back then.   Truitt Merle  11/02/2020

## 2020-11-02 NOTE — Progress Notes (Signed)
[facility] PROGRESS NOTE    Progress Note  Kathleen Gibson  PJA:[id number] DOB: [birth year] DOA: 10/28/2020 PCP: [name], DO     Brief Narrative:   Kathleen Gibson is an 70 y.o. female past medical history of diabetes mellitus type 2, diabetic neuropathy, ITP diagnosed in 2014 and followed by Dr. Annamaria Boots as an outpatient, medical noncompliance, gastric bypass in 2000 essential hypertension pernicious anemia due to B12 deficiency nonalcoholic fatty liver disease comes into the [facility] due to severe thrombocytopenia, she relates she about 8 weeks ago she and her family got sick and since then she has been feeling down.  She relates poor appetite and severe malaise started developing some nosebleeds which was able to control with compression maneuvers.  Her platelet count in the ED was 5 she is transfuse 1 unit of packed platelets hematology was consulted.  Significant Events: 10/28/2020 1 unit of packed platelets  Significant studies: 10/28/2020 abdominal ultrasound gallbladder sludge no acute cholecystitis  Antibiotics: None   Assessment/Plan:   Acute thrombocytopenia: Bone marrow biopsy and axillary lymph node biopsy performed on 10/31/2020, biopsy results hopefully will be back today. Status post 4 units of platelets. Received IVIG and prednisone. Appreciate oncology's assistance. Continue Nplate per hematology oncologist.  Diffuse lymphadenopathy/leukocytosis: Concerning for lymphoma, LDH was low. IR was consulted and performed a bone marrow biopsy on 10/30/2020.  Acute blood loss anemia/Normocytic anemia: Post 1 unit of packed red blood cells, hemoglobin has stabilized currently stable.  Type 2 diabetes mellitus with diabetic polyneuropathy, without long-term current use of insulin  Hold home oral hypoglycemic agents. A1C 5.9.  She is currently on sliding scale insulin meal coverage blood glucose fairly controlled.  Diabetic polyneuropathy associated with type  2 diabetes mellitus (HCC) Continue gabapentin.  Essential hypertension: Continue metoprolol.  B12 deficiency Her B12 is 4000 we will hold on on repletion    History of noncompliance with medical treatment She has been counseled.  She has been counseled  Abdominal lymphadenopathy Further work-up as an outpatient.    DVT prophylaxis: SCD Family Communication:none Status is: Patient   Dispo: The patient is from: Home              Anticipated d/c is to: Home              Patient currently is not medically stable to d/c.   Difficult to place patient No    Code Status:     Code Status Orders  (From admission, onward)         Start     Ordered   10/28/20 2337  Do not attempt resuscitation (DNR)  Continuous       Question Answer Comment  In the event of cardiac or respiratory ARREST Do not call a "code blue"   In the event of cardiac or respiratory ARREST Do not perform Intubation, CPR, defibrillation or ACLS   In the event of cardiac or respiratory ARREST Use medication by any route, position, wound care, and other measures to relive pain and suffering. May use oxygen, suction and manual treatment of airway obstruction as needed for comfort.      10/28/20 2336        Code Status History    Date Active Date Inactive Code Status Order ID Comments User Context   09/06/2019 0407 09/07/2019 1751 Full Code [id number]  [name], MD Inpatient   Advance Care Planning Activity        IV Access:  Peripheral IV   Procedures and diagnostic studies:   No results found.   Medical Consultants:    None.   Subjective:    Kathleen Gibson no complaints.  Objective:    Vitals:   11/01/20 0900 11/01/20 1741 11/01/20 2027 11/02/20 0531  BP: (!) 119/56 135/66 113/69 116/60  Pulse: 96 97 84 79  Resp: $Remo'18 18 18 17  'khoJV$ Temp: 98.2 F (36.8 C) 97.6 F (36.4 C) 98 F (36.7 C) 98.3 F (36.8 C)  TempSrc: Oral Oral Oral Oral  SpO2: 99% 97% 98% 98%  Weight:       Height:       SpO2: 98 % O2 Flow Rate (L/min): 1 L/min   Intake/Output Summary (Last 24 hours) at 11/02/2020 0951 Last data filed at 11/02/2020 0600 Gross per 24 hour  Intake 600 ml  Output 0 ml  Net 600 ml   Filed Weights   10/29/20 0125 10/31/20 2044  Weight: 75.7 kg 77.3 kg    Exam: General exam: In no acute distress. Respiratory system: Good air movement and clear to auscultation. Cardiovascular system: S1 & S2 heard, RRR. No JVD. Gastrointestinal system: Abdomen is nondistended, soft and nontender.  Extremities: No pedal edema. Skin: No rashes, lesions or ulcers  Data Reviewed:    Labs: Basic Metabolic Panel: Recent Labs  Lab 10/28/20 0952 10/28/20 1806 10/29/20 0734  NA 136 135 139  K 3.8 3.8 3.4*  CL 103 104 108  CO2 $Re'27 25 26  'wrH$ GLUCOSE 167* 199* 116*  BUN $Re'16 16 10  'hDx$ CREATININE 0.77 0.87 0.77  CALCIUM 8.4 8.5* 8.0*  MG  --   --  1.8   GFR Estimated Creatinine Clearance: 65.4 mL/min (by C-G formula based on SCr of 0.77 mg/dL). Liver Function Tests: Recent Labs  Lab 10/28/20 0952 10/28/20 1806 10/29/20 0734  AST $Re'14 18 16  '[facility]$ ALT $R'7 11 9  'FX$ ALKPHOS 48 42 40  BILITOT 0.8 0.9 0.9  PROT 8.2 8.6* 7.8  ALBUMIN 3.4* 3.1* 2.9*   No results for input(s): LIPASE, AMYLASE in the last 168 hours. No results for input(s): AMMONIA in the last 168 hours. Coagulation profile Recent Labs  Lab 10/28/20 1806  INR 1.2   COVID-19 Labs  No results for input(s): DDIMER, FERRITIN, LDH, CRP in the last 72 hours.  Lab Results  Component Value Date   [place] NEGATIVE 10/28/2020   [place] NEGATIVE 09/06/2019   [place] NEGATIVE 09/04/2019    CBC: Recent Labs  Lab 10/28/20 0952 10/28/20 1806 10/29/20 0734 10/30/20 0545 10/31/20 0217 11/01/20 0949 11/02/20 0423  WBC 9.2 10.5 8.5 10.0 12.8* 10.7* 11.5*  NEUTROABS 6.7 6.1 6.5 5.2  --  5.8  --   HGB 8.9 Repeated and verified X2.* 8.3* 8.1* 7.6* 6.5* 7.9* 7.8*  HCT 26.8* 27.4* 27.0* 26.3* 22.0* 25.8*  25.7*  MCV 86.8 93.8 92.5 96.0 94.4 92.5 94.1  PLT 4.0 Repeated and verified X2.* <5* <5* <5* 8* 11* 15*   Cardiac Enzymes: No results for input(s): CKTOTAL, CKMB, CKMBINDEX, TROPONINI in the last 168 hours. BNP (last 3 results) No results for input(s): PROBNP in the last 8760 hours. CBG: Recent Labs  Lab 11/01/20 1134 11/01/20 1620 11/01/20 2123 11/02/20 0045 11/02/20 0655  GLUCAP 230* 329* 202* 135* 93   D-Dimer: No results for input(s): DDIMER in the last 72 hours. Hgb A1c: No results for input(s): HGBA1C in the last 72 hours. Lipid Profile: No results for input(s): CHOL, HDL, LDLCALC, TRIG, CHOLHDL, LDLDIRECT in the  last 72 hours. Thyroid function studies: No results for input(s): TSH, T4TOTAL, T3FREE, THYROIDAB in the last 72 hours.  Invalid input(s): FREET3 Anemia work up: No results for input(s): VITAMINB12, FOLATE, FERRITIN, TIBC, IRON, RETICCTPCT in the last 72 hours. Sepsis Labs: Recent Labs  Lab 10/30/20 0545 10/31/20 0217 11/01/20 0949 11/02/20 0423  WBC 10.0 12.8* 10.7* 11.5*   Microbiology Recent Results (from the past 240 hour(s))  Resp Panel by RT-PCR (Flu A&B, Covid) Nasopharyngeal Swab     Status: None   Collection Time: 10/28/20  6:07 PM   Specimen: Nasopharyngeal Swab; Nasopharyngeal(NP) swabs in vial transport medium  Result Value Ref Range Status   SARS Coronavirus 2 by RT PCR NEGATIVE NEGATIVE Final    Comment: (NOTE) SARS-CoV-2 target nucleic acids are NOT DETECTED.  The SARS-CoV-2 RNA is generally detectable in upper respiratory specimens during the acute phase of infection. The lowest concentration of SARS-CoV-2 viral copies this assay can detect is 138 copies/mL. A negative result does not preclude SARS-Cov-2 infection and should not be used as the sole basis for treatment or other patient management decisions. A negative result may occur with  improper specimen collection/handling, submission of specimen other than nasopharyngeal  swab, presence of viral mutation(s) within the areas targeted by this assay, and inadequate number of viral copies(<138 copies/mL). A negative result must be combined with clinical observations, patient history, and epidemiological information. The expected result is Negative.  Fact Sheet for Patients:  BloggerCourse.com  Fact Sheet for Healthcare Providers:  SeriousBroker.it  This test is no t yet approved or cleared by the [place] FDA and  has been authorized for detection and/or diagnosis of SARS-CoV-2 by FDA under an Emergency Use Authorization (EUA). This EUA will remain  in effect (meaning this test can be used) for the duration of the COVID-19 declaration under Section 564(b)(1) of the Act, 21 U.S.C.section 360bbb-3(b)(1), unless the authorization is terminated  or revoked sooner.       Influenza A by PCR NEGATIVE NEGATIVE Final   Influenza B by PCR NEGATIVE NEGATIVE Final    Comment: (NOTE) The Xpert Xpress SARS-CoV-2/FLU/RSV plus assay is intended as an aid in the diagnosis of influenza from Nasopharyngeal swab specimens and should not be used as a sole basis for treatment. Nasal washings and aspirates are unacceptable for Xpert Xpress SARS-CoV-2/FLU/RSV testing.  Fact Sheet for Patients: BloggerCourse.com  Fact Sheet for Healthcare Providers: SeriousBroker.it  This test is not yet approved or cleared by the [place] FDA and has been authorized for detection and/or diagnosis of SARS-CoV-2 by FDA under an Emergency Use Authorization (EUA). This EUA will remain in effect (meaning this test can be used) for the duration of the COVID-19 declaration under Section 564(b)(1) of the Act, 21 U.S.C. section 360bbb-3(b)(1), unless the authorization is terminated or revoked.  Performed at [facility], [address], [place], [place] [address]       Medications:   . acetaminophen  650 mg Oral Once  . folic acid  1 mg Oral Daily  . gabapentin  800 mg Oral TID  . insulin aspart  0-15 Units Subcutaneous TID AC & HS  . metoprolol succinate  50 mg Oral Daily  . mirtazapine  15 mg Oral QHS  . potassium chloride  20 mEq Oral BID  . predniSONE  60 mg Oral Q breakfast   Continuous Infusions:     LOS: 4 days   Marinda Elk  [facility]  11/02/2020, 9:51 AM

## 2020-11-03 DIAGNOSIS — D693 Immune thrombocytopenic purpura: Secondary | ICD-10-CM | POA: Diagnosis not present

## 2020-11-03 DIAGNOSIS — D649 Anemia, unspecified: Secondary | ICD-10-CM | POA: Diagnosis not present

## 2020-11-03 DIAGNOSIS — R591 Generalized enlarged lymph nodes: Secondary | ICD-10-CM | POA: Diagnosis not present

## 2020-11-03 LAB — GLUCOSE, CAPILLARY
Glucose-Capillary: 105 mg/dL — ABNORMAL HIGH (ref 70–99)
Glucose-Capillary: 69 mg/dL — ABNORMAL LOW (ref 70–99)
Glucose-Capillary: 142 mg/dL — ABNORMAL HIGH (ref 70–99)
Glucose-Capillary: 393 mg/dL — ABNORMAL HIGH (ref 70–99)
Glucose-Capillary: 327 mg/dL — ABNORMAL HIGH (ref 70–99)

## 2020-11-03 LAB — CBC
HCT: 27.7 % — ABNORMAL LOW (ref 36.0–46.0)
Hemoglobin: 8.4 g/dL — ABNORMAL LOW (ref 12.0–15.0)
MCH: 28.5 pg (ref 26.0–34.0)
MCHC: 30.3 g/dL (ref 30.0–36.0)
MCV: 93.9 fL (ref 80.0–100.0)
Platelets: 16 10*3/uL — CL (ref 150–400)
RBC: 2.95 MIL/uL — ABNORMAL LOW (ref 3.87–5.11)
RDW: 18.9 % — ABNORMAL HIGH (ref 11.5–15.5)
WBC: 13.2 10*3/uL — ABNORMAL HIGH (ref 4.0–10.5)
nRBC: 0.3 % — ABNORMAL HIGH (ref 0.0–0.2)

## 2020-11-03 MED ORDER — INSULIN GLARGINE 100 UNIT/ML ~~LOC~~ SOLN
25.0000 [IU] | Freq: Two times a day (BID) | SUBCUTANEOUS | Status: DC
  Administered 2020-11-03 – 2020-11-04 (×2): 25 [IU] via SUBCUTANEOUS
  Filled 2020-11-03 (×3): qty 0.25

## 2020-11-03 NOTE — Progress Notes (Signed)
Hypoglycemic Event  CBG: 69  Treatment: 8 oz juice/soda  Symptoms: None  Follow-up CBG: Time:07:18 CBG Result:105  Possible Reasons for Event: Medication regimen: Lantus and novolog  Comments/MD notified:Per hypoglycemia protocol    Jaydon Soroka [name]

## 2020-11-03 NOTE — Progress Notes (Addendum)
[facility] PROGRESS NOTE    Progress Note  Kathleen Gibson  MVV:[id number] DOB: [birth year] DOA: 10/28/2020 PCP: [name], DO     Brief Narrative:   Kathleen Gibson is an 70 y.o. female past medical history of diabetes mellitus type 2, diabetic neuropathy, ITP diagnosed in 2014 and followed by Dr. Annamaria Boots as an outpatient, medical noncompliance, gastric bypass in 2000 essential hypertension pernicious anemia due to B12 deficiency nonalcoholic fatty liver disease comes into the [facility] due to severe thrombocytopenia, she relates she about 8 weeks ago she and her family got sick and since then she has been feeling down.  She relates poor appetite and severe malaise started developing some nosebleeds which was able to control with compression maneuvers.  Her platelet count in the ED was 5 she is transfuse 1 unit of packed platelets hematology was consulted.  Significant Events: 10/28/2020 1 unit of packed platelets  Significant studies: 10/28/2020 abdominal ultrasound gallbladder sludge no acute cholecystitis  Antibiotics: None   Assessment/Plan:   Acute thrombocytopenia: Bone marrow biopsy and axillary lymph node biopsy performed on 10/31/2020, biopsy results still pending Status post 4 units of platelets. Received IVIG, Nplate and prednisone. Appreciate oncology's assistance. Platelets are slowly trending up.  Diffuse lymphadenopathy/leukocytosis: Concerning for lymphoma, LDH was low. IR was consulted and performed a bone marrow biopsy on 10/30/2020.   Appreciate oncology assistance.  Acute blood loss anemia/Normocytic anemia: Post 1 unit of packed red blood cells, hemoglobin has stabilized currently stable.  Type 2 diabetes mellitus with diabetic polyneuropathy, without long-term current use of insulin  Continue to hold home oral hypoglycemic agents. A1C 5.9.  She was started on steroids and her blood glucose became erratic. She was started on long-acting insulin  plus sliding scale now his blood glucose relatively well controlled continue current regimen.  Diabetic polyneuropathy associated with type 2 diabetes mellitus (HCC) Continue gabapentin.  Essential hypertension: Continue metoprolol.  B12 deficiency Her B12 is 4000 we will hold on on repletion    History of noncompliance with medical treatment She has been counseled.  She has been counseled   DVT prophylaxis: SCD Family Communication:none Status is: Patient   Dispo: The patient is from: Home              Anticipated d/c is to: Home              Patient currently is not medically stable to d/c.   Difficult to place patient No    Code Status:     Code Status Orders  (From admission, onward)         Start     Ordered   10/28/20 2337  Do not attempt resuscitation (DNR)  Continuous       Question Answer Comment  In the event of cardiac or respiratory ARREST Do not call a "code blue"   In the event of cardiac or respiratory ARREST Do not perform Intubation, CPR, defibrillation or ACLS   In the event of cardiac or respiratory ARREST Use medication by any route, position, wound care, and other measures to relive pain and suffering. May use oxygen, suction and manual treatment of airway obstruction as needed for comfort.      10/28/20 2336        Code Status History    Date Active Date Inactive Code Status Order ID Comments User Context   09/06/2019 0407 09/07/2019 1751 Full Code [id number]  [name], MD Inpatient   Advance  Care Planning Activity        IV Access:    Peripheral IV   Procedures and diagnostic studies:   No results found.   Medical Consultants:    None.   Subjective:    Kathleen Gibson has no new complaints  Objective:    Vitals:   11/02/20 1734 11/02/20 2111 11/03/20 0425 11/03/20 0941  BP: 132/66 (!) 169/89 118/62 119/61  Pulse: 80 90 77 79  Resp: $Remo'16 16 16 18  'vSFWp$ Temp: 97.8 F (36.6 C) 97.6 F (36.4 C) 97.7 F (36.5 C) 97.6  F (36.4 C)  TempSrc: Oral Oral Oral Oral  SpO2: 98% 97% 100% 99%  Weight:  77.3 kg    Height:       SpO2: 99 % O2 Flow Rate (L/min): 1 L/min   Intake/Output Summary (Last 24 hours) at 11/03/2020 1013 Last data filed at 11/03/2020 0700 Gross per 24 hour  Intake 960 ml  Output 0 ml  Net 960 ml   Filed Weights   10/29/20 0125 10/31/20 2044 11/02/20 2111  Weight: 75.7 kg 77.3 kg 77.3 kg    Exam: General exam: In no acute distress. Respiratory system: Good air movement and clear to auscultation. Cardiovascular system: S1 & S2 heard, RRR. No JVD. Gastrointestinal system: Abdomen is nondistended, soft and nontender.  Extremities: No pedal edema. Skin: No rashes, lesions or ulcers Psychiatry: Judgement and insight appear normal. Mood & affect appropriate.  Data Reviewed:    Labs: Basic Metabolic Panel: Recent Labs  Lab 10/28/20 0952 10/28/20 1806 10/29/20 0734  NA 136 135 139  K 3.8 3.8 3.4*  CL 103 104 108  CO2 $Re'27 25 26  'uAj$ GLUCOSE 167* 199* 116*  BUN $Re'16 16 10  'rKv$ CREATININE 0.77 0.87 0.77  CALCIUM 8.4 8.5* 8.0*  MG  --   --  1.8   GFR Estimated Creatinine Clearance: 65.4 mL/min (by C-G formula based on SCr of 0.77 mg/dL). Liver Function Tests: Recent Labs  Lab 10/28/20 0952 10/28/20 1806 10/29/20 0734  AST $Re'14 18 16  'Hpa$ ALT $R'7 11 9  'kR$ ALKPHOS 48 42 40  BILITOT 0.8 0.9 0.9  PROT 8.2 8.6* 7.8  ALBUMIN 3.4* 3.1* 2.9*   No results for input(s): LIPASE, AMYLASE in the last 168 hours. No results for input(s): AMMONIA in the last 168 hours. Coagulation profile Recent Labs  Lab 10/28/20 1806  INR 1.2   COVID-19 Labs  No results for input(s): DDIMER, FERRITIN, LDH, CRP in the last 72 hours.  Lab Results  Component Value Date   [place] NEGATIVE 10/28/2020   [place] NEGATIVE 09/06/2019   [place] NEGATIVE 09/04/2019    CBC: Recent Labs  Lab 10/28/20 0952 10/28/20 1806 10/29/20 0734 10/30/20 0545 10/31/20 0217 11/01/20 0949 11/02/20 0423  WBC  9.2 10.5 8.5 10.0 12.8* 10.7* 11.5*  NEUTROABS 6.7 6.1 6.5 5.2  --  5.8  --   HGB 8.9 Repeated and verified X2.* 8.3* 8.1* 7.6* 6.5* 7.9* 7.8*  HCT 26.8* 27.4* 27.0* 26.3* 22.0* 25.8* 25.7*  MCV 86.8 93.8 92.5 96.0 94.4 92.5 94.1  PLT 4.0 Repeated and verified X2.* <5* <5* <5* 8* 11* 15*   Cardiac Enzymes: No results for input(s): CKTOTAL, CKMB, CKMBINDEX, TROPONINI in the last 168 hours. BNP (last 3 results) No results for input(s): PROBNP in the last 8760 hours. CBG: Recent Labs  Lab 11/02/20 1144 11/02/20 1735 11/02/20 2112 11/03/20 0640 11/03/20 0718  GLUCAP 195* 416* 296* 69* 105*   D-Dimer: No results for input(s):  DDIMER in the last 72 hours. Hgb A1c: No results for input(s): HGBA1C in the last 72 hours. Lipid Profile: No results for input(s): CHOL, HDL, LDLCALC, TRIG, CHOLHDL, LDLDIRECT in the last 72 hours. Thyroid function studies: No results for input(s): TSH, T4TOTAL, T3FREE, THYROIDAB in the last 72 hours.  Invalid input(s): FREET3 Anemia work up: No results for input(s): VITAMINB12, FOLATE, FERRITIN, TIBC, IRON, RETICCTPCT in the last 72 hours. Sepsis Labs: Recent Labs  Lab 10/30/20 0545 10/31/20 0217 11/01/20 0949 11/02/20 0423  WBC 10.0 12.8* 10.7* 11.5*   Microbiology Recent Results (from the past 240 hour(s))  Resp Panel by RT-PCR (Flu A&B, Covid) Nasopharyngeal Swab     Status: None   Collection Time: 10/28/20  6:07 PM   Specimen: Nasopharyngeal Swab; Nasopharyngeal(NP) swabs in vial transport medium  Result Value Ref Range Status   SARS Coronavirus 2 by RT PCR NEGATIVE NEGATIVE Final    Comment: (NOTE) SARS-CoV-2 target nucleic acids are NOT DETECTED.  The SARS-CoV-2 RNA is generally detectable in upper respiratory specimens during the acute phase of infection. The lowest concentration of SARS-CoV-2 viral copies this assay can detect is 138 copies/mL. A negative result does not preclude SARS-Cov-2 infection and should not be used as the sole  basis for treatment or other patient management decisions. A negative result may occur with  improper specimen collection/handling, submission of specimen other than nasopharyngeal swab, presence of viral mutation(s) within the areas targeted by this assay, and inadequate number of viral copies(<138 copies/mL). A negative result must be combined with clinical observations, patient history, and epidemiological information. The expected result is Negative.  Fact Sheet for Patients:  EntrepreneurPulse.com.au  Fact Sheet for Healthcare Providers:  IncredibleEmployment.be  This test is no t yet approved or cleared by the [place] FDA and  has been authorized for detection and/or diagnosis of SARS-CoV-2 by FDA under an Emergency Use Authorization (EUA). This EUA will remain  in effect (meaning this test can be used) for the duration of the COVID-19 declaration under Section 564(b)(1) of the Act, 21 U.S.C.section 360bbb-3(b)(1), unless the authorization is terminated  or revoked sooner.       Influenza A by PCR NEGATIVE NEGATIVE Final   Influenza B by PCR NEGATIVE NEGATIVE Final    Comment: (NOTE) The Xpert Xpress SARS-CoV-2/FLU/RSV plus assay is intended as an aid in the diagnosis of influenza from Nasopharyngeal swab specimens and should not be used as a sole basis for treatment. Nasal washings and aspirates are unacceptable for Xpert Xpress SARS-CoV-2/FLU/RSV testing.  Fact Sheet for Patients: EntrepreneurPulse.com.au  Fact Sheet for Healthcare Providers: IncredibleEmployment.be  This test is not yet approved or cleared by the [place] FDA and has been authorized for detection and/or diagnosis of SARS-CoV-2 by FDA under an Emergency Use Authorization (EUA). This EUA will remain in effect (meaning this test can be used) for the duration of the COVID-19 declaration under Section 564(b)(1) of the Act,  21 U.S.C. section 360bbb-3(b)(1), unless the authorization is terminated or revoked.  Performed at [facility], [place] [address] [place], [place] [address]      Medications:   . acetaminophen  650 mg Oral Once  . folic acid  1 mg Oral Daily  . gabapentin  800 mg Oral TID  . insulin aspart  0-15 Units Subcutaneous TID WC  . insulin aspart  0-5 Units Subcutaneous QHS  . insulin aspart  4 Units Subcutaneous TID WC  . insulin glargine  30 Units Subcutaneous BID  .  metoprolol succinate  50 mg Oral Daily  . mirtazapine  15 mg Oral QHS  . potassium chloride  20 mEq Oral BID  . predniSONE  60 mg Oral Q breakfast   Continuous Infusions:     LOS: 5 days   Charlynne Cousins  [facility]  11/03/2020, 10:13 AM

## 2020-11-04 ENCOUNTER — Ambulatory Visit: Payer: Medicare HMO

## 2020-11-04 DIAGNOSIS — Z9119 Patient's noncompliance with other medical treatment and regimen: Secondary | ICD-10-CM | POA: Diagnosis not present

## 2020-11-04 DIAGNOSIS — I10 Essential (primary) hypertension: Secondary | ICD-10-CM | POA: Diagnosis not present

## 2020-11-04 DIAGNOSIS — D693 Immune thrombocytopenic purpura: Secondary | ICD-10-CM | POA: Diagnosis not present

## 2020-11-04 DIAGNOSIS — D72825 Bandemia: Secondary | ICD-10-CM

## 2020-11-04 DIAGNOSIS — E669 Obesity, unspecified: Secondary | ICD-10-CM

## 2020-11-04 DIAGNOSIS — R591 Generalized enlarged lymph nodes: Secondary | ICD-10-CM | POA: Diagnosis not present

## 2020-11-04 LAB — CBC
HCT: 29.1 % — ABNORMAL LOW (ref 36.0–46.0)
Hemoglobin: 8.6 g/dL — ABNORMAL LOW (ref 12.0–15.0)
MCH: 27.7 pg (ref 26.0–34.0)
MCHC: 29.6 g/dL — ABNORMAL LOW (ref 30.0–36.0)
MCV: 93.6 fL (ref 80.0–100.0)
Platelets: 15 10*3/uL — CL (ref 150–400)
RBC: 3.11 MIL/uL — ABNORMAL LOW (ref 3.87–5.11)
RDW: 19 % — ABNORMAL HIGH (ref 11.5–15.5)
WBC: 13.8 10*3/uL — ABNORMAL HIGH (ref 4.0–10.5)
nRBC: 0.4 % — ABNORMAL HIGH (ref 0.0–0.2)

## 2020-11-04 LAB — GLUCOSE, CAPILLARY
Glucose-Capillary: 203 mg/dL — ABNORMAL HIGH (ref 70–99)
Glucose-Capillary: 217 mg/dL — ABNORMAL HIGH (ref 70–99)
Glucose-Capillary: 402 mg/dL — ABNORMAL HIGH (ref 70–99)
Glucose-Capillary: 299 mg/dL — ABNORMAL HIGH (ref 70–99)

## 2020-11-04 LAB — SURGICAL PATHOLOGY

## 2020-11-04 MED ORDER — INSULIN ASPART 100 UNIT/ML IJ SOLN
6.0000 [IU] | Freq: Three times a day (TID) | INTRAMUSCULAR | Status: DC
  Administered 2020-11-04 – 2020-11-06 (×6): 6 [IU] via SUBCUTANEOUS

## 2020-11-04 MED ORDER — INSULIN GLARGINE 100 UNIT/ML ~~LOC~~ SOLN
5.0000 [IU] | Freq: Once | SUBCUTANEOUS | Status: AC
  Administered 2020-11-04: 5 [IU] via SUBCUTANEOUS
  Filled 2020-11-04: qty 0.05

## 2020-11-04 MED ORDER — INSULIN GLARGINE 100 UNIT/ML ~~LOC~~ SOLN
30.0000 [IU] | Freq: Two times a day (BID) | SUBCUTANEOUS | Status: DC
  Administered 2020-11-04: 30 [IU] via SUBCUTANEOUS
  Filled 2020-11-04 (×3): qty 0.3

## 2020-11-04 NOTE — Progress Notes (Signed)
PROGRESS NOTE  Kathleen Gibson UMP:[id number] DOB: 1950/08/19   PCP: [name], DO  Patient is from: Home  DOA: 10/28/2020 LOS: 6  Chief complaints: Low platelet  Brief Narrative / Interim history: 70 year old F with PMH of DM-2 with neuropathy, ITP, gastric bypass, HTN, NAFLD and noncompliance presented to ED with severe thrombocytopenia, and admitted for acute on chronic ITP.  Platelet was 5K.  Treated with platelet transfusion (4 apheresis), IVIG, Nplate and oral prednisone with minimal improvement in thrombocytopenia.  She also had bone marrow and right axillary lymph node biopsy performed on 5/20.  Pathology pending.  Hematology following.   Subjective: Seen and examined earlier this morning.  No major events overnight of this morning.  Feels well.  No complaints.  Denies any significant skin bruise or bleeding anywhere.  Denies chest pain, dyspnea, GI or UTI symptoms.  Objective: Vitals:   11/03/20 1713 11/03/20 2027 11/04/20 0406 11/04/20 0917  BP: 130/65 131/64 (!) 146/71 120/61  Pulse: 79 83 80 93  Resp: 18 16 17 18   Temp: 98.2 F (36.8 C) 97.6 F (36.4 C) (!) 97.3 F (36.3 C) 98.8 F (37.1 C)  TempSrc:  Oral Oral Oral  SpO2: 99% 99% 100% 98%  Weight:      Height:        Intake/Output Summary (Last 24 hours) at 11/04/2020 1417 Last data filed at 11/04/2020 1257 Gross per 24 hour  Intake 540 ml  Output 0 ml  Net 540 ml   Filed Weights   10/29/20 0125 10/31/20 2044 11/02/20 2111  Weight: 75.7 kg 77.3 kg 77.3 kg    Examination:  GENERAL: No apparent distress.  Nontoxic. HEENT: MMM.  Vision and hearing grossly intact.  NECK: Supple.  No apparent JVD.  RESP: On RA.  No IWOB.  Fair aeration bilaterally. CVS:  RRR. Heart sounds normal.  ABD/GI/GU: BS+. Abd soft, NTND.  MSK/EXT:  Moves extremities. No apparent deformity. No edema.  SKIN: no apparent skin lesion or wound NEURO: Awake, alert and oriented appropriately.  No apparent focal neuro deficit. PSYCH:  Calm. Normal affect.   Procedures:  5/20-bone marrow and right axillary lymph node biopsy by IR  Microbiology summarized: COVID-19 and influenza PCR nonreactive.  Assessment & Plan: Acute on chronic thrombocytopenia: Baseline platelet ranges from 40s to 90s Recent Labs  Lab 10/28/20 1806 10/29/20 0734 10/30/20 0545 10/31/20 0217 11/01/20 0949 11/02/20 0423 11/03/20 1157 11/04/20 0412  PLT <5* <5* <5* 8* 11* 15* 16* 15*  -S/p 4 apheresis of platelets and IVIG -Started on weekly Nplate -Continue p.o. prednisone -Hematology/oncology following -Transfuse for platelets<10K or bleeding  Diffuse lymphadenopathy/leukocytosis: s/p bone marrow and right axillary biopsy -Follow pathology -Heme-onc following  Acute blood loss anemia/Normocytic anemia: H&H stable after 1 unit. Recent Labs    05/22/20 0841 10/28/20 0952 10/28/20 1806 10/29/20 0734 10/30/20 0545 10/31/20 0217 11/01/20 0949 11/02/20 0423 11/03/20 1157 11/04/20 0412  HGB 11.1* 8.9 Repeated and verified X2.* 8.3* 8.1* 7.6* 6.5* 7.9* 7.8* 8.4* 8.6*  -Monitor intermittently  Controlled NIDDM-2 with polyneuropathy and hyperglycemia: A1c 5.9%.  Hyperglycemia due to steroid No results for input(s): HGBA1C in the last 72 hours. Recent Labs  Lab 11/03/20 1143 11/03/20 1609 11/03/20 2028 11/04/20 0804 11/04/20 1130  GLUCAP 142* 393* 327* 203* 217*  -Continue SSI-moderate -Increase NovoLog from 4 to 6 units 3 times daily with meals -Increase Lantus from 25 to 30 units twice daily -Further adjustment as appropriate -Continue home gabapentin  Essential hypertension: Normotensive -Continue home  metoprolol for now but she could benefit from losartan given diabetes.   History of noncompliance with medical treatment -She has been counseled.  She has been counseled  Leukocytosis/bandemia-likely demargination from steroid  Class I obesity Body mass index is 30.19 kg/m.         DVT prophylaxis:  SCDs  Start: 10/28/20 2337  Code Status: DNR/DNI Family Communication: Patient and/or RN. Available if any question.  Level of care: Med-Surg Status is: Inpatient  Remains inpatient appropriate because:Ongoing diagnostic testing needed not appropriate for outpatient work up and Inpatient level of care appropriate due to severity of illness   Dispo: The patient is from: Home              Anticipated d/c is to: Home              Patient currently is not medically stable to d/c.   Difficult to place patient No       Consultants:  Heme-onc   Sch Meds:  Scheduled Meds: . acetaminophen  650 mg Oral Once  . folic acid  1 mg Oral Daily  . gabapentin  800 mg Oral TID  . insulin aspart  0-15 Units Subcutaneous TID WC  . insulin aspart  0-5 Units Subcutaneous QHS  . insulin aspart  4 Units Subcutaneous TID WC  . insulin glargine  25 Units Subcutaneous BID  . metoprolol succinate  50 mg Oral Daily  . mirtazapine  15 mg Oral QHS  . potassium chloride  20 mEq Oral BID  . predniSONE  60 mg Oral Q breakfast   Continuous Infusions: PRN Meds:.acetaminophen **OR** acetaminophen, melatonin, ondansetron **OR** ondansetron (ZOFRAN) IV, polyethylene glycol  Antimicrobials: Anti-infectives (From admission, onward)   None       I have personally reviewed the following labs and images: CBC: Recent Labs  Lab 10/28/20 1806 10/29/20 0734 10/30/20 0545 10/31/20 0217 11/01/20 0949 11/02/20 0423 11/03/20 1157 11/04/20 0412  WBC 10.5 8.5 10.0 12.8* 10.7* 11.5* 13.2* 13.8*  NEUTROABS 6.1 6.5 5.2  --  5.8  --   --   --   HGB 8.3* 8.1* 7.6* 6.5* 7.9* 7.8* 8.4* 8.6*  HCT 27.4* 27.0* 26.3* 22.0* 25.8* 25.7* 27.7* 29.1*  MCV 93.8 92.5 96.0 94.4 92.5 94.1 93.9 93.6  PLT <5* <5* <5* 8* 11* 15* 16* 15*   BMP &GFR Recent Labs  Lab 10/28/20 1806 10/29/20 0734  NA 135 139  K 3.8 3.4*  CL 104 108  CO2 25 26  GLUCOSE 199* 116*  BUN 16 10  CREATININE 0.87 0.77  CALCIUM 8.5* 8.0*  MG  --   1.8   Estimated Creatinine Clearance: 65.4 mL/min (by C-G formula based on SCr of 0.77 mg/dL). Liver & Pancreas: Recent Labs  Lab 10/28/20 1806 10/29/20 0734  AST 18 16  ALT 11 9  ALKPHOS 42 40  BILITOT 0.9 0.9  PROT 8.6* 7.8  ALBUMIN 3.1* 2.9*   No results for input(s): LIPASE, AMYLASE in the last 168 hours. No results for input(s): AMMONIA in the last 168 hours. Diabetic: No results for input(s): HGBA1C in the last 72 hours. Recent Labs  Lab 11/03/20 1143 11/03/20 1609 11/03/20 2028 11/04/20 0804 11/04/20 1130  GLUCAP 142* 393* 327* 203* 217*   Cardiac Enzymes: No results for input(s): CKTOTAL, CKMB, CKMBINDEX, TROPONINI in the last 168 hours. No results for input(s): PROBNP in the last 8760 hours. Coagulation Profile: Recent Labs  Lab 10/28/20 1806  INR 1.2   Thyroid Function  Tests: No results for input(s): TSH, T4TOTAL, FREET4, T3FREE, THYROIDAB in the last 72 hours. Lipid Profile: No results for input(s): CHOL, HDL, LDLCALC, TRIG, CHOLHDL, LDLDIRECT in the last 72 hours. Anemia Panel: No results for input(s): VITAMINB12, FOLATE, FERRITIN, TIBC, IRON, RETICCTPCT in the last 72 hours. Urine analysis:    Component Value Date/Time   COLORURINE YELLOW 09/05/2019 2240   APPEARANCEUR CLEAR 09/05/2019 2240   LABSPEC 1.010 09/05/2019 2240   PHURINE 5.5 09/05/2019 2240   GLUCOSEU NEGATIVE 09/05/2019 2240   HGBUR MODERATE (A) 09/05/2019 2240   BILIRUBINUR NEGATIVE 09/05/2019 2240   KETONESUR NEGATIVE 09/05/2019 2240   PROTEINUR 30 (A) 09/05/2019 2240   NITRITE NEGATIVE 09/05/2019 2240   LEUKOCYTESUR NEGATIVE 09/05/2019 2240   Sepsis Labs: Invalid input(s): PROCALCITONIN, [place]  Microbiology: Recent Results (from the past 240 hour(s))  Resp Panel by RT-PCR (Flu A&B, Covid) Nasopharyngeal Swab     Status: None   Collection Time: 10/28/20  6:07 PM   Specimen: Nasopharyngeal Swab; Nasopharyngeal(NP) swabs in vial transport medium  Result Value Ref Range  Status   SARS Coronavirus 2 by RT PCR NEGATIVE NEGATIVE Final    Comment: (NOTE) SARS-CoV-2 target nucleic acids are NOT DETECTED.  The SARS-CoV-2 RNA is generally detectable in upper respiratory specimens during the acute phase of infection. The lowest concentration of SARS-CoV-2 viral copies this assay can detect is 138 copies/mL. A negative result does not preclude SARS-Cov-2 infection and should not be used as the sole basis for treatment or other patient management decisions. A negative result may occur with  improper specimen collection/handling, submission of specimen other than nasopharyngeal swab, presence of viral mutation(s) within the areas targeted by this assay, and inadequate number of viral copies(<138 copies/mL). A negative result must be combined with clinical observations, patient history, and epidemiological information. The expected result is Negative.  Fact Sheet for Patients:  EntrepreneurPulse.com.au  Fact Sheet for Healthcare Providers:  IncredibleEmployment.be  This test is no t yet approved or cleared by the [place] FDA and  has been authorized for detection and/or diagnosis of SARS-CoV-2 by FDA under an Emergency Use Authorization (EUA). This EUA will remain  in effect (meaning this test can be used) for the duration of the COVID-19 declaration under Section 564(b)(1) of the Act, 21 U.S.C.section 360bbb-3(b)(1), unless the authorization is terminated  or revoked sooner.       Influenza A by PCR NEGATIVE NEGATIVE Final   Influenza B by PCR NEGATIVE NEGATIVE Final    Comment: (NOTE) The Xpert Xpress SARS-CoV-2/FLU/RSV plus assay is intended as an aid in the diagnosis of influenza from Nasopharyngeal swab specimens and should not be used as a sole basis for treatment. Nasal washings and aspirates are unacceptable for Xpert Xpress SARS-CoV-2/FLU/RSV testing.  Fact Sheet for  Patients: EntrepreneurPulse.com.au  Fact Sheet for Healthcare Providers: IncredibleEmployment.be  This test is not yet approved or cleared by the [place] FDA and has been authorized for detection and/or diagnosis of SARS-CoV-2 by FDA under an Emergency Use Authorization (EUA). This EUA will remain in effect (meaning this test can be used) for the duration of the COVID-19 declaration under Section 564(b)(1) of the Act, 21 U.S.C. section 360bbb-3(b)(1), unless the authorization is terminated or revoked.  Performed at [facility], [place] [address] [place], [place] [address]     Radiology Studies: No results found.    Wyat Infinger [address] [place]  If 7PM-7AM, please contact night-coverage [email or web address] 11/04/2020, 2:17 PM

## 2020-11-04 NOTE — Plan of Care (Signed)
  Problem: Clinical Measurements: Goal: Diagnostic test results will improve Outcome: Progressing   

## 2020-11-05 ENCOUNTER — Encounter: Payer: Self-pay | Admitting: Family Medicine

## 2020-11-05 DIAGNOSIS — R591 Generalized enlarged lymph nodes: Secondary | ICD-10-CM | POA: Diagnosis not present

## 2020-11-05 DIAGNOSIS — D693 Immune thrombocytopenic purpura: Secondary | ICD-10-CM | POA: Diagnosis not present

## 2020-11-05 DIAGNOSIS — I10 Essential (primary) hypertension: Secondary | ICD-10-CM | POA: Diagnosis not present

## 2020-11-05 DIAGNOSIS — Z9119 Patient's noncompliance with other medical treatment and regimen: Secondary | ICD-10-CM | POA: Diagnosis not present

## 2020-11-05 LAB — MAGNESIUM: Magnesium: 1.9 mg/dL (ref 1.7–2.4)

## 2020-11-05 LAB — CBC
HCT: 28.8 % — ABNORMAL LOW (ref 36.0–46.0)
Hemoglobin: 8.6 g/dL — ABNORMAL LOW (ref 12.0–15.0)
MCH: 27.9 pg (ref 26.0–34.0)
MCHC: 29.9 g/dL — ABNORMAL LOW (ref 30.0–36.0)
MCV: 93.5 fL (ref 80.0–100.0)
Platelets: 11 10*3/uL — CL (ref 150–400)
RBC: 3.08 MIL/uL — ABNORMAL LOW (ref 3.87–5.11)
RDW: 18.8 % — ABNORMAL HIGH (ref 11.5–15.5)
WBC: 16.4 10*3/uL — ABNORMAL HIGH (ref 4.0–10.5)
nRBC: 0.4 % — ABNORMAL HIGH (ref 0.0–0.2)

## 2020-11-05 LAB — GLUCOSE, CAPILLARY
Glucose-Capillary: 63 mg/dL — ABNORMAL LOW (ref 70–99)
Glucose-Capillary: 98 mg/dL (ref 70–99)
Glucose-Capillary: 153 mg/dL — ABNORMAL HIGH (ref 70–99)
Glucose-Capillary: 336 mg/dL — ABNORMAL HIGH (ref 70–99)
Glucose-Capillary: 234 mg/dL — ABNORMAL HIGH (ref 70–99)

## 2020-11-05 LAB — RENAL FUNCTION PANEL
Albumin: 2.8 g/dL — ABNORMAL LOW (ref 3.5–5.0)
Anion gap: 4 — ABNORMAL LOW (ref 5–15)
BUN: 19 mg/dL (ref 8–23)
CO2: 28 mmol/L (ref 22–32)
Calcium: 8.6 mg/dL — ABNORMAL LOW (ref 8.9–10.3)
Chloride: 106 mmol/L (ref 98–111)
Creatinine, Ser: 0.82 mg/dL (ref 0.44–1.00)
GFR, Estimated: >60 mL/min (ref >60)
Glucose, Bld: 66 mg/dL — ABNORMAL LOW (ref 70–99)
Phosphorus: 3.1 mg/dL (ref 2.5–4.6)
Potassium: 4.1 mmol/L (ref 3.5–5.1)
Sodium: 138 mmol/L (ref 135–145)

## 2020-11-05 LAB — SURGICAL PATHOLOGY

## 2020-11-05 MED ORDER — ROMIPLOSTIM 250 MCG ~~LOC~~ SOLR
3.0000 ug/kg | Freq: Once | SUBCUTANEOUS | Status: AC
Start: 2020-11-05 — End: 2020-11-05
  Administered 2020-11-05: 230 ug via SUBCUTANEOUS
  Filled 2020-11-05: qty 0.46

## 2020-11-05 MED ORDER — INSULIN GLARGINE 100 UNIT/ML ~~LOC~~ SOLN
20.0000 [IU] | Freq: Every day | SUBCUTANEOUS | Status: DC
  Administered 2020-11-05: 20 [IU] via SUBCUTANEOUS
  Filled 2020-11-05 (×2): qty 0.2

## 2020-11-05 MED ORDER — INSULIN GLARGINE 100 UNIT/ML ~~LOC~~ SOLN
30.0000 [IU] | Freq: Every day | SUBCUTANEOUS | Status: DC
  Administered 2020-11-05: 30 [IU] via SUBCUTANEOUS
  Filled 2020-11-05 (×2): qty 0.3

## 2020-11-05 MED ORDER — PREDNISONE 20 MG PO TABS
40.0000 mg | ORAL_TABLET | Freq: Every day | ORAL | Status: DC
  Administered 2020-11-06: 40 mg via ORAL
  Filled 2020-11-05: qty 2

## 2020-11-05 NOTE — Progress Notes (Addendum)
HEMATOLOGY-ONCOLOGY PROGRESS NOTE  SUBJECTIVE:   Resting quietly in bed this afternoon.  No complaints today.  The patient's niece was at the bedside and her daughter joined [place] by speaker phone.  REVIEW OF SYSTEMS:   10 Point review of Systems was done is negative except as noted above.  I have reviewed the past medical history, past surgical history, social history and family history with the patient and they are unchanged from previous note.   PHYSICAL EXAMINATION: ECOG PERFORMANCE STATUS: 2 - Symptomatic, <50% confined to bed  Vitals:   11/04/20 2100 11/05/20 0631  BP: 129/69 126/79  Pulse: 92 80  Resp: 18 16  Temp: 97.9 F (36.6 C) 97.7 F (36.5 C)  SpO2: 96% 99%   Filed Weights   10/29/20 0125 10/31/20 2044 11/02/20 2111  Weight: 75.7 kg 77.3 kg 77.3 kg    Intake/Output from previous day: 05/25 0701 - 05/26 0700 In: 600 [P.O.:600] Out: -    GENERAL:alert, in no acute distress and comfortable SKIN: no acute rashes, no significant lesions EYES: conjunctiva are pink and non-injected, sclera anicteric OROPHARYNX: MMM, no exudates, no oropharyngeal erythema or ulceration NECK: supple, no JVD LYMPH: Palpable cervical, supraclavicular, axillary lymphadenopathy LUNGS: clear to auscultation b/l with normal respiratory effort HEART: regular rate & rhythm ABDOMEN:  normoactive bowel sounds , non tender, not distended. Extremity: no pedal edema PSYCH: alert & oriented x 3 with fluent speech NEURO: no focal motor/sensory deficits   LABORATORY DATA:  I have reviewed the data as listed CMP Latest Ref Rng & Units 11/05/2020 10/29/2020 10/28/2020  Glucose 70 - 99 mg/dL 66(L) 116(H) 199(H)  BUN 8 - 23 mg/dL $Remove'19 10 16  'UTvwMmm$ Creatinine 0.44 - 1.00 mg/dL 0.82 0.77 0.87  Sodium 135 - 145 mmol/L 138 139 135  Potassium 3.5 - 5.1 mmol/L 4.1 3.4(L) 3.8  Chloride 98 - 111 mmol/L 106 108 104  CO2 22 - 32 mmol/L $RemoveB'28 26 25  'nXUnGvCE$ Calcium 8.9 - 10.3 mg/dL 8.6(L) 8.0(L) 8.5(L)  Total Protein 6.5 -  8.1 g/dL - 7.8 8.6(H)  Total Bilirubin 0.3 - 1.2 mg/dL - 0.9 0.9  Alkaline Phos 38 - 126 U/L - 40 42  AST 15 - 41 U/L - 16 18  ALT 0 - 44 U/L - 9 11    Lab Results  Component Value Date   WBC 16.4 (H) 11/05/2020   HGB 8.6 (L) 11/05/2020   HCT 28.8 (L) 11/05/2020   MCV 93.5 11/05/2020   PLT 11 (LL) 11/05/2020   NEUTROABS 5.8 11/01/2020    CT ABDOMEN PELVIS WO CONTRAST  Result Date: 10/29/2020 CLINICAL DATA:  Hematologic malignancy, staging. EXAM: CT CHEST, ABDOMEN AND PELVIS WITHOUT CONTRAST TECHNIQUE: Multidetector CT imaging of the chest, abdomen and pelvis was performed following the standard protocol without IV contrast. COMPARISON:  History of MRI diarrhea neck no palpable status chest left at [facility] 7 right-sided than she thickened change ir indications sick concern for insert at the right 6 arm. FINDINGS: CT CHEST FINDINGS Cardiovascular: The aortic and branch vessel atherosclerosis without aneurysmal dilation. Normal size heart. No significant pericardial effusion/thickening. Mediastinum/Nodes: Enlarged supraclavicular, bilateral axillary and left mammary lymph nodes. For reference: Left internal mammary lymph node measures 1.9 by 1.2 cm on image 52/7. Right axillary lymph node measures 3.1 x 2.3 cm on image 36/7. Prominent mediastinal lymph nodes measuring up to 9 mm in short axis on image 39/7, without adenopathy by size criteria. Thyroid is grossly unremarkable. Trachea esophagus are grossly unremarkable. Lungs/Pleura: Right lower  lobe calcified granuloma. Bibasilar atelectasis. Scattered bilateral micro nodules measuring 1-2 mm for instance on image 96/4 in the left lower lobe and image 72/4 in the right upper lobe. Musculoskeletal: Peripherally calcified nodule in the right axilla on image 22/3. No acute osseous abnormality. CT ABDOMEN PELVIS FINDINGS Hepatobiliary: Grossly unremarkable noncontrast appearance of the hepatic parenchyma. No hepatomegaly. Gallbladder is distended with  some layering hyperdense material representing sludge and tiny stones seen on prior ultrasound. No biliary ductal dilation. Pancreas: Within normal limits. Spleen: Splenomegaly measuring 22 cm in maximum craniocaudal dimension. Adrenals/Urinary Tract: Adrenal glands are unremarkable. Kidneys are normal, without renal calculi, contour deforming lesion, or hydronephrosis. Bladder is unremarkable. Stomach/Bowel: Prior gastric bypass surgery. Enteric contrast visualized to the level of the distal small bowel. No pathologic dilation of small bowel. Appendix is grossly unremarkable. Colonic diverticulosis without findings of acute diverticulitis. Vascular/Lymphatic: Aortic atherosclerosis without aneurysmal dilation. No significant interval change in the enlarged and prominent upper abdominal, retroperitoneal and mesenteric lymph nodes. Interval increase in size of the few enlarged and prominent pelvic lymph nodes. Reference lymph nodes are as follows: Periportal lymph node measuring 3.3 x 1.7 cm on image 54/3, unchanged. Aortocaval lymph node measures 1.6 x 1.5 cm on image 69/3, unchanged. Right pelvic sidewall lymph node measures 3.1 x 1.6 cm on image 109/3. Reproductive: Uterus and bilateral adnexa are unremarkable. Other: Small volume abdominopelvic ascites with diffuse mesenteric stranding. Multiple peritoneal calcifications are again visualized and unchanged from prior, likely sequela prior inflammation. Similar appearance of the nonspecific anterior abdominal wall subcutaneous calcifications. Mild diffuse subcutaneous edema. Musculoskeletal: Multilevel degenerative changes spine. No acute osseous abnormality. IMPRESSION: 1. Splenomegaly with pathologically enlarged lymph nodes above and below the diaphragm, with overall stable to minimally increased abdominal adenopathy and interval progression of the pelvic adenopathy. 2. Small volume abdominopelvic ascites with diffuse mesenteric stranding. 3. Scattered bilateral  pulmonary micro nodules measuring 1-2 mm. 4. Distended gallbladder with some layering hyperdense material representing layering sludge and tiny stones seen on prior ultrasound. 5. Aortic atherosclerosis. Aortic Atherosclerosis (ICD10-I70.0). Electronically Signed   By: Dahlia Bailiff MD   On: 10/29/2020 19:53   CT CHEST WO CONTRAST  Result Date: 10/29/2020 CLINICAL DATA:  Hematologic malignancy, staging. EXAM: CT CHEST, ABDOMEN AND PELVIS WITHOUT CONTRAST TECHNIQUE: Multidetector CT imaging of the chest, abdomen and pelvis was performed following the standard protocol without IV contrast. COMPARISON:  History of MRI diarrhea neck no palpable status chest left at [facility] 7 right-sided than she thickened change ir indications sick concern for insert at the right 6 arm. FINDINGS: CT CHEST FINDINGS Cardiovascular: The aortic and branch vessel atherosclerosis without aneurysmal dilation. Normal size heart. No significant pericardial effusion/thickening. Mediastinum/Nodes: Enlarged supraclavicular, bilateral axillary and left mammary lymph nodes. For reference: Left internal mammary lymph node measures 1.9 by 1.2 cm on image 52/7. Right axillary lymph node measures 3.1 x 2.3 cm on image 36/7. Prominent mediastinal lymph nodes measuring up to 9 mm in short axis on image 39/7, without adenopathy by size criteria. Thyroid is grossly unremarkable. Trachea esophagus are grossly unremarkable. Lungs/Pleura: Right lower lobe calcified granuloma. Bibasilar atelectasis. Scattered bilateral micro nodules measuring 1-2 mm for instance on image 96/4 in the left lower lobe and image 72/4 in the right upper lobe. Musculoskeletal: Peripherally calcified nodule in the right axilla on image 22/3. No acute osseous abnormality. CT ABDOMEN PELVIS FINDINGS Hepatobiliary: Grossly unremarkable noncontrast appearance of the hepatic parenchyma. No hepatomegaly. Gallbladder is distended with some layering hyperdense material representing sludge  and  tiny stones seen on prior ultrasound. No biliary ductal dilation. Pancreas: Within normal limits. Spleen: Splenomegaly measuring 22 cm in maximum craniocaudal dimension. Adrenals/Urinary Tract: Adrenal glands are unremarkable. Kidneys are normal, without renal calculi, contour deforming lesion, or hydronephrosis. Bladder is unremarkable. Stomach/Bowel: Prior gastric bypass surgery. Enteric contrast visualized to the level of the distal small bowel. No pathologic dilation of small bowel. Appendix is grossly unremarkable. Colonic diverticulosis without findings of acute diverticulitis. Vascular/Lymphatic: Aortic atherosclerosis without aneurysmal dilation. No significant interval change in the enlarged and prominent upper abdominal, retroperitoneal and mesenteric lymph nodes. Interval increase in size of the few enlarged and prominent pelvic lymph nodes. Reference lymph nodes are as follows: Periportal lymph node measuring 3.3 x 1.7 cm on image 54/3, unchanged. Aortocaval lymph node measures 1.6 x 1.5 cm on image 69/3, unchanged. Right pelvic sidewall lymph node measures 3.1 x 1.6 cm on image 109/3. Reproductive: Uterus and bilateral adnexa are unremarkable. Other: Small volume abdominopelvic ascites with diffuse mesenteric stranding. Multiple peritoneal calcifications are again visualized and unchanged from prior, likely sequela prior inflammation. Similar appearance of the nonspecific anterior abdominal wall subcutaneous calcifications. Mild diffuse subcutaneous edema. Musculoskeletal: Multilevel degenerative changes spine. No acute osseous abnormality. IMPRESSION: 1. Splenomegaly with pathologically enlarged lymph nodes above and below the diaphragm, with overall stable to minimally increased abdominal adenopathy and interval progression of the pelvic adenopathy. 2. Small volume abdominopelvic ascites with diffuse mesenteric stranding. 3. Scattered bilateral pulmonary micro nodules measuring 1-2 mm. 4.  Distended gallbladder with some layering hyperdense material representing layering sludge and tiny stones seen on prior ultrasound. 5. Aortic atherosclerosis. Aortic Atherosclerosis (ICD10-I70.0). Electronically Signed   By: Dahlia Bailiff MD   On: 10/29/2020 19:53   CT BIOPSY  Result Date: 10/30/2020 INDICATION: 70 year old female presents for ultrasound-guided right axillary node biopsy EXAM: ULTRASOUND-GUIDED RIGHT AXILLARY NODE BIOPSY MEDICATIONS: None. ANESTHESIA/SEDATION: Moderate (conscious) sedation was employed during this procedure, as the same moderate sedation that was performed during the accession number [id number]. Same sedation time for sequential procedures. A total of Versed 3.0 mg and Fentanyl 125 mcg was administered intravenously. Moderate Sedation Time: 42 minutes. The patient's level of consciousness and vital signs were monitored continuously by radiology nursing throughout the procedure under my direct supervision. FLUOROSCOPY TIME:  ULTRASOUND COMPLICATIONS: NONE PROCEDURE: Informed written consent was obtained from the patient after a thorough discussion of the procedural risks, benefits and alternatives. All questions were addressed. Maximal Sterile Barrier Technique was utilized including caps, mask, sterile gowns, sterile gloves, sterile drape, hand hygiene and skin antiseptic. A timeout was performed prior to the initiation of the procedure. After a bone marrow biopsy was performed under CT guidance, the patient was turned into the supine position on her stretcher. Ultrasound images of the right axillary region were performed with images stored. The right axillary region was prepped with chlorhexidine in a sterile fashion, and a sterile drape was applied covering the operative field. A sterile gown and sterile gloves were used for the procedure. Local anesthesia was provided with 1% Lidocaine. Ultrasound guidance was used to infiltrate the region with 1% lidocaine for local  anesthesia. Five separate 18 gauge core biopsy were then acquired of the right axillary node using ultrasound guidance. Specimens placed into fresh specimen/saline. Patient tolerated the procedure well and remained hemodynamically stable throughout. No complications were encountered and no significant blood loss was encounter IMPRESSION: Status post ultrasound-guided biopsy of right axillary lymph node. Signed, Dulcy Fanny. Dellia Nims, [facility]  Electronically Signed   By: Corrie Mckusick D.O.   On: 10/30/2020 11:05   CT BONE MARROW BIOPSY & ASPIRATION  Result Date: 10/30/2020 INDICATION: 70 year old female referred for bone marrow biopsy EXAM: CT BONE MARROW BIOPSY AND ASPIRATION MEDICATIONS: None. ANESTHESIA/SEDATION: Moderate (conscious) sedation was employed during this procedure. A total of Versed 3.0 mg and Fentanyl 125 mcg was administered intravenously. Moderate Sedation Time: 42 minutes. The patient's level of consciousness and vital signs were monitored continuously by radiology nursing throughout the procedure under my direct supervision. FLUOROSCOPY TIME:  CT COMPLICATIONS: None PROCEDURE: The procedure risks, benefits, and alternatives were explained to the patient. Questions regarding the procedure were encouraged and answered. The patient understands and consents to the procedure. Scout CT of the pelvis was performed for surgical planning purposes. The posterior pelvis was prepped with Chlorhexidine in a sterile fashion, and a sterile drape was applied covering the operative field. A sterile gown and sterile gloves were used for the procedure. Local anesthesia was provided with 1% Lidocaine. Posterior right iliac bone was targeted for biopsy. The skin and subcutaneous tissues were infiltrated with 1% lidocaine without epinephrine. A small stab incision was made with an 11 blade scalpel, and an 11 gauge Murphy needle was advanced with CT  guidance to the posterior cortex. Manual forced was used to advance the needle through the posterior cortex and the stylet was removed. A bone marrow aspirate was retrieved and passed to a cytotechnologist in the room. The Murphy needle was then advanced without the stylet for a core biopsy. The core biopsy was retrieved and also passed to a cytotechnologist. Manual pressure was used for hemostasis and a sterile dressing was placed. No complications were encountered no significant blood loss was encountered. Patient tolerated the procedure well and remained hemodynamically stable throughout. After the bone marrow biopsy was completed, the patient was then positioned supine on her stretcher and we proceeded with the next staff of the planned case, a right axillary lymph node ultrasound-guided biopsy. The same sedation time is applied for both procedures. IMPRESSION: Status post CT-guided bone marrow biopsy, with tissue specimen sent to pathology for complete histopathologic analysis Signed, Dulcy Fanny. Earleen Newport, DO [facility] Electronically Signed   By: Corrie Mckusick D.O.   On: 10/30/2020 11:02   US Abdomen Limited RUQ (LIVER/GB)  Result Date: 10/28/2020 CLINICAL DATA:  Follow-up cirrhosis EXAM: ULTRASOUND ABDOMEN LIMITED RIGHT UPPER QUADRANT COMPARISON:  09/05/2019 FINDINGS: Gallbladder: Gallbladder is well distended with gallbladder sludge within. Previously seen calculi are noted within the dependent sludge. No wall thickening or pericholecystic fluid is noted. Negative sonographic Murphy's sign is elicited. Common bile duct: Diameter: 2.3 mm. Liver: No focal lesion identified. Within normal limits in parenchymal echogenicity. Portal vein is patent on color Doppler imaging with normal direction of blood flow towards the liver. Other: None. IMPRESSION: Gallbladder sludge and cholelithiasis. No complicating factors are noted. Electronically Signed   By: Inez Catalina M.D.   On: 10/28/2020 23:34    ASSESSMENT AND PLAN:  Ms. Quinby is a 70 year old female with history of ITP and lymphadenopathy who presented to the hospital with severe thrombocytopenia and anemia.  1. Severe thrombocytopenia 2. Normocytic anemia 3. Lymphadenopathy and splenomegaly concerning for neoplastic process - ? Low grade NHL vs other etiology like sarcoidosis. 4. Type 2 diabetes mellitus with polyneuropathy 5.  Hypertension  PLAN -Status post IVIG and remains on prednisone 60 mg daily.  -Platelet count 11,000 today.  No active bleeding  will hold on platelet transfusion. -Administer Nplate 3 mcg/kg today. -continue to transfuse PRBC for hgb<7.5 or if symptomatic -transfuse platelets for PLT count <10k or if bleeding -Discussed with the patient and her family that her low platelets are likely related to ITP. -Bone marrow biopsy results discussed with the patient and her family.  Bone marrow shows increased blood cells with some abnormalities.  There was no evidence of acute leukemia or high-grade lymphoma.  However definitive diagnosis not able to be made at this time based on bone marrow biopsy.  Differentials include MPN/MDS versus [name].  We will marrow biopsy has been sent for cytogenetics and FISH panel.  This may take up to 2 weeks to result.  We will plan to discuss the final results once they are available to [place]. -Lymph node biopsy resulted just prior to our visit.  This was discussed with the patient and her family.  Lymph node biopsy nondiagnostic.  I recommend excisional lymph node biopsy. -Discussed proceeding with excisional lymph node biopsy by general surgery.  Patient is agreeable.  I have discussed her case with the PA from general surgery and with Dr. Donne Hazel.  Given her severe thrombocytopenia, the want to wait on proceeding with excisional lymph node biopsy until platelets are above 50,000.  They we will plan to do this procedure as an outpatient. -The  patient remains on prednisone.  She is having some hypoglycemic episodes.  States that she does not want to go home on insulin.  We will begin to taper prednisone.  She will begin prednisone 40 mg daily starting 5/27.    Mikey Bussing  11/05/2020 9:32 AM   Addendum  I have seen the patient. I agree with the assessment and and plan and have edited the notes.   I discussed her bone marrow biopsy and lymph node biopsy with patient, and her daughter in detail.  I still do not have a definitive diagnosis from bone marrow, cytogenetics and FISH panel are pending, which will help with the diagnosis. MPN with MDS is on deferential.  The lymph node needle biopsy was negative for malignant cells, but sample was limited in the flow cytometry was not able to obtain.  I recommend surgical lymph node biopsy, will refer to surgery.  For her persistent thrombocytopenia, will give second dose of Nplate today with increased dose to 6mcg/kg and continue weekly.  Due to her lack of response to steroid, will drop prednisone to $RemoveBefor'30mg'[id number]$  daily tomorrow, and taper it off in 1 week. All questions were answered. OK to discharge home (or SNF if needed) if plt>15K, and I will f/u closely in my clinic.   Truitt Merle  11/05/2020

## 2020-11-05 NOTE — Progress Notes (Signed)
PROGRESS NOTE  Kathleen Gibson SLH:[id number] DOB: 1951/02/18   PCP: [name], DO  Patient is from: Home  DOA: 10/28/2020 LOS: 7  Chief complaints: Low platelet  Brief Narrative / Interim history: 70 year old F with PMH of DM-2 with neuropathy, ITP, gastric bypass, HTN, NAFLD and noncompliance presented to ED with severe thrombocytopenia, and admitted for acute on chronic ITP.  Platelet was 5K.  Treated with platelet transfusion (4 apheresis), IVIG, Nplate and oral prednisone with minimal improvement in thrombocytopenia.    Patient had bone marrow and right axillary lymph node biopsy performed on 5/20.  Pathology on bone marrow biopsy "raise the possibility of a myeloid neoplasm with the differential diagnosis including a low-grade myelodysplastic syndrome and chronic myelomonocytic leukemia (dysplastic type)".  Pathology from right axillary lymph node pending.   Subjective: Seen and examined earlier this morning.  Highly fluctuating CBG overnight.  She was hyperglycemic to 400s in the evening.  His CBG was down to 63 earlier this morning but improved to 98. She reports some soreness in her left neck/throat.  Denies dysphagia or odynophagia.  Otherwise, no complaints.  Denies chest pain, shortness of breath or signs of bleeding.  Patient's sister at bedside.  Objective: Vitals:   11/04/20 1719 11/04/20 2100 11/05/20 0631 11/05/20 0957  BP: 129/67 129/69 126/79 121/70  Pulse: 91 92 80 83  Resp: $Remo'19 18 16 19  'YkMyQ$ Temp: 97.7 F (36.5 C) 97.9 F (36.6 C) 97.7 F (36.5 C) 97.6 F (36.4 C)  TempSrc: Oral Oral Oral Oral  SpO2: 95% 96% 99% 98%  Weight:      Height:        Intake/Output Summary (Last 24 hours) at 11/05/2020 1404 Last data filed at 11/05/2020 0900 Gross per 24 hour  Intake 600 ml  Output 0 ml  Net 600 ml   Filed Weights   10/29/20 0125 10/31/20 2044 11/02/20 2111  Weight: 75.7 kg 77.3 kg 77.3 kg    Examination:  GENERAL: No apparent distress.  Nontoxic. HEENT:  MMM.  Vision and hearing grossly intact.  NECK: Supple.  No apparent JVD.  No LAD. RESP: On RA.  No IWOB.  Fair aeration bilaterally. CVS:  RRR. Heart sounds normal.  ABD/GI/GU: BS+. Abd soft, NTND.  MSK/EXT:  Moves extremities. No apparent deformity. No edema.  SKIN: no apparent skin lesion or wound NEURO: Awake and alert. Oriented appropriately.  No apparent focal neuro deficit. PSYCH: Calm. Normal affect.   Procedures:  5/20-bone marrow and right axillary lymph node biopsy by IR  Microbiology summarized: COVID-19 and influenza PCR nonreactive.  Assessment & Plan: Acute on chronic thrombocytopenia: Baseline platelet ranges from 40s to 90s Recent Labs  Lab 10/30/20 0545 10/31/20 0217 11/01/20 0949 11/02/20 0423 11/03/20 1157 11/04/20 0412 11/05/20 0340  PLT <5* 8* 11* 15* 16* 15* 11*  -S/p 4 apheresis of platelets and IVIG -Continue p.o. prednisone and weekly Nplate per heme-onc -Transfuse for platelets<10K or bleeding  Diffuse lymphadenopathy/leukocytosis: s/p bone marrow and right axillary biopsy.  Pathology from bone marrow biopsy as above. -Follow pathology from right axillary lymph node -Heme-onc following.  Acute blood loss anemia/Normocytic anemia: H&H stable after 1 unit. Recent Labs    10/28/20 0952 10/28/20 1806 10/29/20 0734 10/30/20 0545 10/31/20 0217 11/01/20 0949 11/02/20 0423 11/03/20 1157 11/04/20 0412 11/05/20 0340  HGB 8.9 Repeated and verified X2.* 8.3* 8.1* 7.6* 6.5* 7.9* 7.8* 8.4* 8.6* 8.6*  -Monitor intermittently  Controlled NIDDM-2 with polyneuropathy, hypoglycemia and hyperglycemia: A1c 5.9%.  Hyperglycemia likely  due to steroid. Recent Labs  Lab 11/04/20 1615 11/04/20 2051 11/05/20 0632 11/05/20 0720 11/05/20 1128  GLUCAP 402* 299* 63* 98 153*  -Continue SSI-moderate -Continue NovoLog 6 units 3 times daily with meals -Change Lantus from 30 units twice daily to 30 units in the morning and 20 units at bedtime -Further  adjustment as appropriate -Continue home gabapentin  Essential hypertension: Normotensive -Continue home metoprolol for now but she could benefit from losartan given diabetes.   History of noncompliance with medical treatment -She has been counseled.  She has been counseled  Leukocytosis/bandemia-likely demargination from steroid  Class I obesity Body mass index is 30.19 kg/m.         DVT prophylaxis:  SCDs Start: 10/28/20 2337  Code Status: DNR/DNI Family Communication: Updated patient's sister at bedside. Level of care: Med-Surg Status is: Inpatient  Remains inpatient appropriate because:Ongoing diagnostic testing needed not appropriate for outpatient work up and Inpatient level of care appropriate due to severity of illness   Dispo: The patient is from: Home              Anticipated d/c is to: Home              Patient currently is not medically stable to d/c.   Difficult to place patient No       Consultants:  Heme-onc   Sch Meds:  Scheduled Meds: . acetaminophen  650 mg Oral Once  . folic acid  1 mg Oral Daily  . gabapentin  800 mg Oral TID  . insulin aspart  0-15 Units Subcutaneous TID WC  . insulin aspart  0-5 Units Subcutaneous QHS  . insulin aspart  6 Units Subcutaneous TID WC  . insulin glargine  30 Units Subcutaneous Daily   And  . insulin glargine  20 Units Subcutaneous QHS  . metoprolol succinate  50 mg Oral Daily  . mirtazapine  15 mg Oral QHS  . potassium chloride  20 mEq Oral BID  . [START ON 11/06/2020] predniSONE  40 mg Oral Q breakfast   Continuous Infusions: PRN Meds:.acetaminophen **OR** acetaminophen, melatonin, ondansetron **OR** ondansetron (ZOFRAN) IV, polyethylene glycol  Antimicrobials: Anti-infectives (From admission, onward)   None       I have personally reviewed the following labs and images: CBC: Recent Labs  Lab 10/30/20 0545 10/31/20 0217 11/01/20 0949 11/02/20 0423 11/03/20 1157 11/04/20 0412  11/05/20 0340  WBC 10.0   < > 10.7* 11.5* 13.2* 13.8* 16.4*  NEUTROABS 5.2  --  5.8  --   --   --   --   HGB 7.6*   < > 7.9* 7.8* 8.4* 8.6* 8.6*  HCT 26.3*   < > 25.8* 25.7* 27.7* 29.1* 28.8*  MCV 96.0   < > 92.5 94.1 93.9 93.6 93.5  PLT <5*   < > 11* 15* 16* 15* 11*   < > = values in this interval not displayed.   BMP &GFR Recent Labs  Lab 11/05/20 0340  NA 138  K 4.1  CL 106  CO2 28  GLUCOSE 66*  BUN 19  CREATININE 0.82  CALCIUM 8.6*  MG 1.9  PHOS 3.1   Estimated Creatinine Clearance: 63.8 mL/min (by C-G formula based on SCr of 0.82 mg/dL). Liver & Pancreas: Recent Labs  Lab 11/05/20 0340  ALBUMIN 2.8*   No results for input(s): LIPASE, AMYLASE in the last 168 hours. No results for input(s): AMMONIA in the last 168 hours. Diabetic: No results for input(s): HGBA1C in the  last 72 hours. Recent Labs  Lab 11/04/20 1615 11/04/20 2051 11/05/20 0632 11/05/20 0720 11/05/20 1128  GLUCAP 402* 299* 63* 98 153*   Cardiac Enzymes: No results for input(s): CKTOTAL, CKMB, CKMBINDEX, TROPONINI in the last 168 hours. No results for input(s): PROBNP in the last 8760 hours. Coagulation Profile: No results for input(s): INR, PROTIME in the last 168 hours. Thyroid Function Tests: No results for input(s): TSH, T4TOTAL, FREET4, T3FREE, THYROIDAB in the last 72 hours. Lipid Profile: No results for input(s): CHOL, HDL, LDLCALC, TRIG, CHOLHDL, LDLDIRECT in the last 72 hours. Anemia Panel: No results for input(s): VITAMINB12, FOLATE, FERRITIN, TIBC, IRON, RETICCTPCT in the last 72 hours. Urine analysis:    Component Value Date/Time   COLORURINE YELLOW 09/05/2019 2240   APPEARANCEUR CLEAR 09/05/2019 2240   LABSPEC 1.010 09/05/2019 2240   PHURINE 5.5 09/05/2019 2240   GLUCOSEU NEGATIVE 09/05/2019 2240   HGBUR MODERATE (A) 09/05/2019 2240   BILIRUBINUR NEGATIVE 09/05/2019 2240   KETONESUR NEGATIVE 09/05/2019 2240   PROTEINUR 30 (A) 09/05/2019 2240   NITRITE NEGATIVE 09/05/2019  2240   LEUKOCYTESUR NEGATIVE 09/05/2019 2240   Sepsis Labs: Invalid input(s): PROCALCITONIN, [place]  Microbiology: Recent Results (from the past 240 hour(s))  Resp Panel by RT-PCR (Flu A&B, Covid) Nasopharyngeal Swab     Status: None   Collection Time: 10/28/20  6:07 PM   Specimen: Nasopharyngeal Swab; Nasopharyngeal(NP) swabs in vial transport medium  Result Value Ref Range Status   SARS Coronavirus 2 by RT PCR NEGATIVE NEGATIVE Final    Comment: (NOTE) SARS-CoV-2 target nucleic acids are NOT DETECTED.  The SARS-CoV-2 RNA is generally detectable in upper respiratory specimens during the acute phase of infection. The lowest concentration of SARS-CoV-2 viral copies this assay can detect is 138 copies/mL. A negative result does not preclude SARS-Cov-2 infection and should not be used as the sole basis for treatment or other patient management decisions. A negative result may occur with  improper specimen collection/handling, submission of specimen other than nasopharyngeal swab, presence of viral mutation(s) within the areas targeted by this assay, and inadequate number of viral copies(<138 copies/mL). A negative result must be combined with clinical observations, patient history, and epidemiological information. The expected result is Negative.  Fact Sheet for Patients:  EntrepreneurPulse.com.au  Fact Sheet for Healthcare Providers:  IncredibleEmployment.be  This test is no t yet approved or cleared by the [place] FDA and  has been authorized for detection and/or diagnosis of SARS-CoV-2 by FDA under an Emergency Use Authorization (EUA). This EUA will remain  in effect (meaning this test can be used) for the duration of the COVID-19 declaration under Section 564(b)(1) of the Act, 21 U.S.C.section 360bbb-3(b)(1), unless the authorization is terminated  or revoked sooner.       Influenza A by PCR NEGATIVE NEGATIVE Final    Influenza B by PCR NEGATIVE NEGATIVE Final    Comment: (NOTE) The Xpert Xpress SARS-CoV-2/FLU/RSV plus assay is intended as an aid in the diagnosis of influenza from Nasopharyngeal swab specimens and should not be used as a sole basis for treatment. Nasal washings and aspirates are unacceptable for Xpert Xpress SARS-CoV-2/FLU/RSV testing.  Fact Sheet for Patients: EntrepreneurPulse.com.au  Fact Sheet for Healthcare Providers: IncredibleEmployment.be  This test is not yet approved or cleared by the [place] FDA and has been authorized for detection and/or diagnosis of SARS-CoV-2 by FDA under an Emergency Use Authorization (EUA). This EUA will remain in effect (meaning this test can be used) for the duration of  the COVID-19 declaration under Section 564(b)(1) of the Act, 21 U.S.C. section 360bbb-3(b)(1), unless the authorization is terminated or revoked.  Performed at [facility], [place] [address], [place], [place] [address]     Radiology Studies: No results found.    Jacqueleen Pulver T. [place]  If 7PM-7AM, please contact night-coverage [email or web address] 11/05/2020, 2:04 PM

## 2020-11-05 NOTE — Progress Notes (Signed)
Hypoglycemic Event  CBG: 63   Treatment: orange juice  & crackers   Symptoms: None  Follow-up CBG: Time:0705 CBG Result:  Possible Reasons for Event: Patient didn't have bedtime snack   Comments/MD notified:     Clint Bolder

## 2020-11-05 NOTE — Plan of Care (Signed)
  Problem: Activity: Goal: Risk for activity intolerance will decrease Outcome: Progressing   

## 2020-11-06 DIAGNOSIS — Z9119 Patient's noncompliance with other medical treatment and regimen: Secondary | ICD-10-CM | POA: Diagnosis not present

## 2020-11-06 DIAGNOSIS — D693 Immune thrombocytopenic purpura: Secondary | ICD-10-CM | POA: Diagnosis not present

## 2020-11-06 DIAGNOSIS — I10 Essential (primary) hypertension: Secondary | ICD-10-CM | POA: Diagnosis not present

## 2020-11-06 DIAGNOSIS — R591 Generalized enlarged lymph nodes: Secondary | ICD-10-CM | POA: Diagnosis not present

## 2020-11-06 LAB — CBC
HCT: 28 % — ABNORMAL LOW (ref 36.0–46.0)
Hemoglobin: 8.6 g/dL — ABNORMAL LOW (ref 12.0–15.0)
MCH: 28.5 pg (ref 26.0–34.0)
MCHC: 30.7 g/dL (ref 30.0–36.0)
MCV: 92.7 fL (ref 80.0–100.0)
Platelets: 8 10*3/uL — CL (ref 150–400)
RBC: 3.02 MIL/uL — ABNORMAL LOW (ref 3.87–5.11)
RDW: 19.1 % — ABNORMAL HIGH (ref 11.5–15.5)
WBC: 18 10*3/uL — ABNORMAL HIGH (ref 4.0–10.5)
nRBC: 0.3 % — ABNORMAL HIGH (ref 0.0–0.2)

## 2020-11-06 LAB — RENAL FUNCTION PANEL
Albumin: 2.7 g/dL — ABNORMAL LOW (ref 3.5–5.0)
Anion gap: 4 — ABNORMAL LOW (ref 5–15)
BUN: 19 mg/dL (ref 8–23)
CO2: 24 mmol/L (ref 22–32)
Calcium: 8.8 mg/dL — ABNORMAL LOW (ref 8.9–10.3)
Chloride: 108 mmol/L (ref 98–111)
Creatinine, Ser: 0.72 mg/dL (ref 0.44–1.00)
GFR, Estimated: >60 mL/min (ref >60)
Glucose, Bld: 130 mg/dL — ABNORMAL HIGH (ref 70–99)
Phosphorus: 3.7 mg/dL (ref 2.5–4.6)
Potassium: 4.1 mmol/L (ref 3.5–5.1)
Sodium: 136 mmol/L (ref 135–145)

## 2020-11-06 LAB — GLUCOSE, CAPILLARY
Glucose-Capillary: 77 mg/dL (ref 70–99)
Glucose-Capillary: 219 mg/dL — ABNORMAL HIGH (ref 70–99)
Glucose-Capillary: 345 mg/dL — ABNORMAL HIGH (ref 70–99)
Glucose-Capillary: 204 mg/dL — ABNORMAL HIGH (ref 70–99)

## 2020-11-06 LAB — MAGNESIUM: Magnesium: 1.7 mg/dL (ref 1.7–2.4)

## 2020-11-06 MED ORDER — SODIUM CHLORIDE 0.9% IV SOLUTION
Freq: Once | INTRAVENOUS | Status: AC
  Administered 2020-11-06: 10 mL via INTRAVENOUS

## 2020-11-06 MED ORDER — PREDNISONE 20 MG PO TABS
20.0000 mg | ORAL_TABLET | Freq: Every day | ORAL | Status: DC
  Administered 2020-11-07: 20 mg via ORAL
  Filled 2020-11-06: qty 1

## 2020-11-06 MED ORDER — INSULIN GLARGINE 100 UNIT/ML ~~LOC~~ SOLN
15.0000 [IU] | Freq: Every day | SUBCUTANEOUS | Status: DC
  Administered 2020-11-06: 15 [IU] via SUBCUTANEOUS
  Filled 2020-11-06 (×2): qty 0.15

## 2020-11-06 MED ORDER — INSULIN GLARGINE 100 UNIT/ML ~~LOC~~ SOLN
30.0000 [IU] | Freq: Every day | SUBCUTANEOUS | Status: DC
  Administered 2020-11-06: 30 [IU] via SUBCUTANEOUS
  Filled 2020-11-06 (×2): qty 0.3

## 2020-11-06 NOTE — Progress Notes (Signed)
PROGRESS NOTE  Kathleen Gibson TGY:[id number] DOB: [birth year]   PCP: Natalia Leatherwood, DO  Patient is from: Home  DOA: 10/28/2020 LOS: 8  Chief complaints: Low platelet  Brief Narrative / Interim history: 70 year old F with PMH of DM-2 with neuropathy, ITP, gastric bypass, HTN, NAFLD and noncompliance presented to ED with severe thrombocytopenia, and admitted for acute on chronic ITP.  Platelet was 5K.  Treated with platelet transfusion (4 apheresis), IVIG, Nplate and oral prednisone with some initial improvement.   Patient had bone marrow and right axillary lymph node biopsy performed on 5/20.  Pathology on bone marrow biopsy was not conclusive.  Pathology from right axillary lymph node nondiagnostic.  General surgery consulted for excisional lymph node biopsy.   Platelet is downtrending again.  Additional 2 units of platelets ordered.  General surgery consulted for excisional lymph node biopsy but did not appreciate palpable nodes, and will reevaluate early next week.  Subjective: Seen and examined earlier this morning.  No major events overnight of this morning.  Continues to feel some tenderness in her neck behind her left jaw.  Denies dysphagia or odynophagia.  No palpable mass or nodule.  Denies bleeding anywhere.  Niece at bedside.  Objective: Vitals:   11/06/20 1015 11/06/20 1316 11/06/20 1330 11/06/20 1353  BP: 127/65 130/67 130/67 119/64  Pulse: 88 94 94 90  Resp: 18 18 18 18   Temp: 98.1 F (36.7 C) 98.3 F (36.8 C) 98.3 F (36.8 C) 98.1 F (36.7 C)  TempSrc: Oral Oral Oral Oral  SpO2: 98% 93%    Weight:      Height:        Intake/Output Summary (Last 24 hours) at 11/06/2020 1403 Last data filed at 11/06/2020 1015 Gross per 24 hour  Intake 1057.67 ml  Output --  Net 1057.67 ml   Filed Weights   10/29/20 0125 10/31/20 2044 11/02/20 2111  Weight: 75.7 kg 77.3 kg 77.3 kg    Examination:  GENERAL: No apparent distress.  Nontoxic. HEENT: MMM.  Vision and hearing  grossly intact.  NECK: Supple.  No apparent JVD.  Slight tenderness behind her left jaw but no palpable mass or node swelling. RESP: On RA.  No IWOB.  Fair aeration bilaterally. CVS:  RRR. Heart sounds normal.  ABD/GI/GU: BS+. Abd soft, NTND.  MSK/EXT:  Moves extremities. No apparent deformity. No edema.  SKIN: Slight bruise at IV sites.  Some hematoma in the right axilla. NEURO: Awake and alert. Oriented appropriately.  No apparent focal neuro deficit. PSYCH: Calm. Normal affect.   Procedures:  5/20-bone marrow and right axillary lymph node biopsy by IR  Microbiology summarized: COVID-19 and influenza PCR nonreactive.  Assessment & Plan: Acute on chronic thrombocytopenia: Baseline platelet ranges from 40s to 90s Recent Labs  Lab 10/31/20 0217 11/01/20 0949 11/02/20 0423 11/03/20 1157 11/04/20 0412 11/05/20 0340 11/06/20 0359  PLT 8* 11* 15* 16* 15* 11* 8*  -S/p 4 apheresis of platelets and IVIG -Transfused 2 apheresis of platelets -Continue weekly Nplate per heme-onc -P.o. prednisone did not seem to help. Hematology tapering. -Transfuse for platelets<10K or bleeding  Diffuse lymphadenopathy/leukocytosis: s/p bone marrow and right axillary biopsy results as above. -General surgery consulted for possible excisional lymph node biopsy but no palpable lymph node -Heme-onc following. -General surgery to reevaluate on 5/30  Acute blood loss anemia/Normocytic anemia: H&H stable after 1 unit. Recent Labs    10/28/20 1806 10/29/20 10/31/20 10/30/20 0545 10/31/20 0217 11/01/20 11/03/20 11/02/20 0423 11/03/20 1157 11/04/20 11/06/20 11/05/20  0340 11/06/20 0359  HGB 8.3* 8.1* 7.6* 6.5* 7.9* 7.8* 8.4* 8.6* 8.6* 8.6*  -Monitor intermittently  Controlled NIDDM-2 with polyneuropathy, hypoglycemia and hyperglycemia: A1c 5.9%.  Hyperglycemia likely due to steroid. Recent Labs  Lab 11/05/20 1128 11/05/20 1641 11/05/20 2118 11/06/20 0717 11/06/20 1143  GLUCAP 153* 336* 234* 77 219*   -Continue SSI-moderate -Continue NovoLog 6 units 3 times daily with meals -Continue with Lantus 30 units in the morning -Decrease nightly Lantus from 20 to 15 units -Further adjustment as appropriate -Continue home gabapentin  Essential hypertension: Normotensive -Continue home metoprolol for now but she could benefit from losartan given diabetes.   History of noncompliance with medical treatment -She has been counseled.  She has been counseled  Leukocytosis/bandemia-likely demargination from steroid  Class I obesity Body mass index is 30.19 kg/m.         DVT prophylaxis:  SCDs Start: 10/28/20 2337  Code Status: DNR/DNI Family Communication: Updated patient's niece at bedside. Level of care: Med-Surg Status is: Inpatient  Remains inpatient appropriate because:Ongoing diagnostic testing needed not appropriate for outpatient work up, IV treatments appropriate due to intensity of illness or inability to take PO and Inpatient level of care appropriate due to severity of illness   Dispo: The patient is from: Home              Anticipated d/c is to: Home              Patient currently is not medically stable to d/c.   Difficult to place patient No       Consultants:  Heme-onc General surgery   Sch Meds:  Scheduled Meds: . acetaminophen  650 mg Oral Once  . folic acid  1 mg Oral Daily  . gabapentin  800 mg Oral TID  . insulin aspart  0-15 Units Subcutaneous TID WC  . insulin aspart  0-5 Units Subcutaneous QHS  . insulin aspart  6 Units Subcutaneous TID WC  . insulin glargine  15 Units Subcutaneous QHS   And  . insulin glargine  30 Units Subcutaneous Daily  . metoprolol succinate  50 mg Oral Daily  . mirtazapine  15 mg Oral QHS  . potassium chloride  20 mEq Oral BID  . predniSONE  40 mg Oral Q breakfast   Continuous Infusions: PRN Meds:.acetaminophen **OR** acetaminophen, melatonin, ondansetron **OR** ondansetron (ZOFRAN) IV, polyethylene  glycol  Antimicrobials: Anti-infectives (From admission, onward)   None       I have personally reviewed the following labs and images: CBC: Recent Labs  Lab 11/01/20 0949 11/02/20 0423 11/03/20 1157 11/04/20 0412 11/05/20 0340 11/06/20 0359  WBC 10.7* 11.5* 13.2* 13.8* 16.4* 18.0*  NEUTROABS 5.8  --   --   --   --   --   HGB 7.9* 7.8* 8.4* 8.6* 8.6* 8.6*  HCT 25.8* 25.7* 27.7* 29.1* 28.8* 28.0*  MCV 92.5 94.1 93.9 93.6 93.5 92.7  PLT 11* 15* 16* 15* 11* 8*   BMP &GFR Recent Labs  Lab 11/05/20 0340 11/06/20 0359  NA 138 136  K 4.1 4.1  CL 106 108  CO2 28 24  GLUCOSE 66* 130*  BUN 19 19  CREATININE 0.82 0.72  CALCIUM 8.6* 8.8*  MG 1.9 1.7  PHOS 3.1 3.7   Estimated Creatinine Clearance: 65.4 mL/min (by C-G formula based on SCr of 0.72 mg/dL). Liver & Pancreas: Recent Labs  Lab 11/05/20 0340 11/06/20 0359  ALBUMIN 2.8* 2.7*   No results for input(s): LIPASE, AMYLASE in  the last 168 hours. No results for input(s): AMMONIA in the last 168 hours. Diabetic: No results for input(s): HGBA1C in the last 72 hours. Recent Labs  Lab 11/05/20 1128 11/05/20 1641 11/05/20 2118 11/06/20 0717 11/06/20 1143  GLUCAP 153* 336* 234* 77 219*   Cardiac Enzymes: No results for input(s): CKTOTAL, CKMB, CKMBINDEX, TROPONINI in the last 168 hours. No results for input(s): PROBNP in the last 8760 hours. Coagulation Profile: No results for input(s): INR, PROTIME in the last 168 hours. Thyroid Function Tests: No results for input(s): TSH, T4TOTAL, FREET4, T3FREE, THYROIDAB in the last 72 hours. Lipid Profile: No results for input(s): CHOL, HDL, LDLCALC, TRIG, CHOLHDL, LDLDIRECT in the last 72 hours. Anemia Panel: No results for input(s): VITAMINB12, FOLATE, FERRITIN, TIBC, IRON, RETICCTPCT in the last 72 hours. Urine analysis:    Component Value Date/Time   COLORURINE YELLOW 09/05/2019 2240   APPEARANCEUR CLEAR 09/05/2019 2240   LABSPEC 1.010 09/05/2019 2240   PHURINE  5.5 09/05/2019 2240   GLUCOSEU NEGATIVE 09/05/2019 2240   HGBUR MODERATE (A) 09/05/2019 2240   BILIRUBINUR NEGATIVE 09/05/2019 2240   KETONESUR NEGATIVE 09/05/2019 2240   PROTEINUR 30 (A) 09/05/2019 2240   NITRITE NEGATIVE 09/05/2019 2240   LEUKOCYTESUR NEGATIVE 09/05/2019 2240   Sepsis Labs: Invalid input(s): PROCALCITONIN, [place]  Microbiology: Recent Results (from the past 240 hour(s))  Resp Panel by RT-PCR (Flu A&B, Covid) Nasopharyngeal Swab     Status: None   Collection Time: 10/28/20  6:07 PM   Specimen: Nasopharyngeal Swab; Nasopharyngeal(NP) swabs in vial transport medium  Result Value Ref Range Status   SARS Coronavirus 2 by RT PCR NEGATIVE NEGATIVE Final    Comment: (NOTE) SARS-CoV-2 target nucleic acids are NOT DETECTED.  The SARS-CoV-2 RNA is generally detectable in upper respiratory specimens during the acute phase of infection. The lowest concentration of SARS-CoV-2 viral copies this assay can detect is 138 copies/mL. A negative result does not preclude SARS-Cov-2 infection and should not be used as the sole basis for treatment or other patient management decisions. A negative result may occur with  improper specimen collection/handling, submission of specimen other than nasopharyngeal swab, presence of viral mutation(s) within the areas targeted by this assay, and inadequate number of viral copies(<138 copies/mL). A negative result must be combined with clinical observations, patient history, and epidemiological information. The expected result is Negative.  Fact Sheet for Patients:  EntrepreneurPulse.com.au  Fact Sheet for Healthcare Providers:  IncredibleEmployment.be  This test is no t yet approved or cleared by the [place] FDA and  has been authorized for detection and/or diagnosis of SARS-CoV-2 by FDA under an Emergency Use Authorization (EUA). This EUA will remain  in effect (meaning this test can be used)  for the duration of the COVID-19 declaration under Section 564(b)(1) of the Act, 21 U.S.C.section 360bbb-3(b)(1), unless the authorization is terminated  or revoked sooner.       Influenza A by PCR NEGATIVE NEGATIVE Final   Influenza B by PCR NEGATIVE NEGATIVE Final    Comment: (NOTE) The Xpert Xpress SARS-CoV-2/FLU/RSV plus assay is intended as an aid in the diagnosis of influenza from Nasopharyngeal swab specimens and should not be used as a sole basis for treatment. Nasal washings and aspirates are unacceptable for Xpert Xpress SARS-CoV-2/FLU/RSV testing.  Fact Sheet for Patients: EntrepreneurPulse.com.au  Fact Sheet for Healthcare Providers: IncredibleEmployment.be  This test is not yet approved or cleared by the [place] FDA and has been authorized for detection and/or diagnosis of SARS-CoV-2 by FDA  under an Emergency Use Authorization (EUA). This EUA will remain in effect (meaning this test can be used) for the duration of the COVID-19 declaration under Section 564(b)(1) of the Act, 21 U.S.C. section 360bbb-3(b)(1), unless the authorization is terminated or revoked.  Performed at [facility], [place] [address], [place], [place] [address]     Radiology Studies: No results found.    Jaspreet Bodner T. [place]  If 7PM-7AM, please contact night-coverage [email or web address] 11/06/2020, 2:03 PM

## 2020-11-06 NOTE — Consult Note (Signed)
Consult Note  Kathleen Gibson 11-Jul-1950  [id number].    Requesting MD: Dr. Truitt Merle Chief Complaint/Reason for Consult: Possible ITP - request for LN biopsy  HPI:  Patient is a 70 year old female who was admitted 10/28/20 with generalized malaise, weakness and poor appetite. She had been having nosebleeds at home which she was able to control with pressure alone. She reports known illness about 8 weeks ago that family all had and she had a negative home COVID test. Patient spoke to a telemedicine provider after symptoms continued to persist and was prescribed a course of abx with no improvement. She gradually lost 12 lbs over the following weeks. She eventually saw her PCP and was found to have severe thrombocytopenia with a platelet count of 4K. She was directed to the ED. Patient has a known history of ITP and has been followed by Dr. Burr Medico, she was diagnosed in 2014. PMH otherwise significant for T2DM, diabetic polyneuropathy, HLD, HTN, pernicious anemia/B12 deficiency, NAFLD, and medical noncompliance. Past abdominal surgery includes a gastric bypass, cesarean section, hernia repair, and abdominoplasty. She currently denies chest pain, SOB, abdominal pain, hematuria, dark stools, bleeding gums. She reports appetite has improved some. She is nervous about undergoing a surgery with platelets being low.   ROS: Review of Systems  Constitutional: Negative for chills and fever.  HENT: Positive for nosebleeds.   Respiratory: Negative for shortness of breath and wheezing.   Cardiovascular: Negative for chest pain and palpitations.  Gastrointestinal: Negative for abdominal pain, blood in stool, constipation, diarrhea, melena, nausea and vomiting.  Genitourinary: Negative for dysuria, hematuria and urgency.  Neurological: Negative for weakness.  Endo/Heme/Allergies: Bruises/bleeds easily.  All other systems reviewed and are negative.   Family History  Problem Relation Age of Onset  . Cancer  Mother 79       unknown type cancer   . Hypertension Mother   . Hypertension Father   . Heart failure Father   . Pneumonia Father   . Diabetes Sister   . Leukemia Brother   . Colon cancer Brother 21  . Heart Problems Brother   . Leukemia Other   . Diabetes Sister   . Heart Problems Sister   . Cancer Brother   . Heart Problems Brother     Past Medical History:  Diagnosis Date  . Diabetes ([place])   . HLD (hyperlipidemia)   . Hypertension   . Pernicious anemia   . Pneumonia 09/05/2019  . Renal insufficiency 09/06/2019  . Vitamin D deficiency     Past Surgical History:  Procedure Laterality Date  . ABDOMINAL HERNIA REPAIR  2009  . ABDOMINOPLASTY    . [place]  . GASTRIC BYPASS  2000  . hemorroid surgery  2007  . TONSILLECTOMY AND ADENOIDECTOMY  1957    Social History:  reports that she has never smoked. She has never used smokeless tobacco. She reports that she does not drink alcohol and does not use drugs.  Allergies:  Allergies  Allergen Reactions  . Asa [Aspirin] Other (See Comments)    Contraindicated d/t low plts  . Nsaids Other (See Comments)    Contraindicated d/t low plts    Medications Prior to Admission  Medication Sig Dispense Refill  . ferrous sulfate 325 (65 FE) MG tablet Take 1 tablet (325 mg total) by mouth daily with breakfast. (Patient taking differently: Take 650 mg by mouth daily with breakfast.) 90 tablet 3  . gabapentin (NEURONTIN) 300  MG capsule 900 my QHS and 300 mg every 8 hrs in day (Patient taking differently: Take 600 mg by mouth 2 (two) times daily.) 450 capsule 1  . metoprolol succinate (TOPROL-XL) 50 MG 24 hr tablet Take 1 tablet (50 mg total) by mouth daily. 90 tablet 1  . sitaGLIPtin-metformin (JANUMET) 50-1000 MG tablet Take 1 tablet by mouth at bedtime. 90 tablet 1  . Vitamin D, Cholecalciferol, 25 MCG (1000 UT) CAPS Take 1,000 Units by mouth daily.     [name] zolpidem (AMBIEN) 5 MG tablet Take 1 tablet (5 mg total) by mouth  at bedtime. 90 tablet 0  . Cyanocobalamin (B-12) 1000 MCG SUBL Place 1 tablet under the tongue daily. 90 tablet 3  . glucose monitoring kit (FREESTYLE) monitoring kit 1 each by Does not apply route as needed for other. Use as directed 1 each 0  . mirtazapine (REMERON SOL-TAB) 15 MG disintegrating tablet Take 1 tablet (15 mg total) by mouth at bedtime. 90 tablet 1    Blood pressure 127/65, pulse 88, temperature 98.1 F (36.7 C), temperature source Oral, resp. rate 18, height _0  (1.6 m), weight 77.3 kg, SpO2 98 %. Physical Exam:  General: pleasant, WD, overweight female who is sitting up in bed in NAD HEENT: head is normocephalic, atraumatic.  Sclera are noninjected.  PERRL.  Ears and nose without any masses or lesions.  Mouth is pink and moist. No bleeding gums Lymph: no palpable nodes in anterior or posterior cervical chains, no palpable supraclavicular nodes bilaterally, palpable nodes in bilateral axilla Heart: regular, rate, and rhythm.  Palpable radial pulses bilaterally  Lungs: No wheezing.  Respiratory effort nonlabored Abd: soft, NT, ND, no masses, hernias, or organomegaly MS: all 4 extremities are symmetrical with no cyanosis, clubbing, or edema. Skin: ecchymosis in R axilla at previous bx site, ecchymosis scattered on limbs Neuro: Cranial nerves 2-12 grossly intact, sensation is normal throughout Psych: A&Ox3 with an appropriate affect.   Results for orders placed or performed during the hospital encounter of 10/28/20 (from the past 48 hour(s))  Glucose, capillary     Status: Abnormal   Collection Time: 11/04/20 11:30 AM  Result Value Ref Range   Glucose-Capillary 217 (H) 70 - 99 mg/dL    Comment: Glucose reference range applies only to samples taken after fasting for at least 8 hours.  Glucose, capillary     Status: Abnormal   Collection Time: 11/04/20  4:15 PM  Result Value Ref Range   Glucose-Capillary 402 (H) 70 - 99 mg/dL    Comment: Glucose reference range applies  only to samples taken after fasting for at least 8 hours.  Glucose, capillary     Status: Abnormal   Collection Time: 11/04/20  8:51 PM  Result Value Ref Range   Glucose-Capillary 299 (H) 70 - 99 mg/dL    Comment: Glucose reference range applies only to samples taken after fasting for at least 8 hours.  CBC     Status: Abnormal   Collection Time: 11/05/20  3:40 AM  Result Value Ref Range   WBC 16.4 (H) 4.0 - 10.5 K/uL   RBC 3.08 (L) 3.87 - 5.11 MIL/uL   Hemoglobin 8.6 (L) 12.0 - 15.0 g/dL   HCT 28.8 (L) 36.0 - 46.0 %   MCV 93.5 80.0 - 100.0 fL   MCH 27.9 26.0 - 34.0 pg   MCHC 29.9 (L) 30.0 - 36.0 g/dL   RDW 18.8 (H) 11.5 - 15.5 %   Platelets 11 (LL) 150 -  400 K/uL    Comment: Immature Platelet Fraction may be clinically indicated, consider ordering this additional test [id number] CONSISTENT WITH PREVIOUS RESULT REPEATED TO VERIFY    nRBC 0.4 (H) 0.0 - 0.2 %    Comment: Performed at [facility], [place] [address] [place], [place] [address]  Renal function panel     Status: Abnormal   Collection Time: 11/05/20  3:40 AM  Result Value Ref Range   Sodium 138 135 - 145 mmol/L   Potassium 4.1 3.5 - 5.1 mmol/L   Chloride 106 98 - 111 mmol/L   CO2 28 22 - 32 mmol/L   Glucose, Bld 66 (L) 70 - 99 mg/dL    Comment: Glucose reference range applies only to samples taken after fasting for at least 8 hours.   BUN 19 8 - 23 mg/dL   Creatinine, Ser 0.82 0.44 - 1.00 mg/dL   Calcium 8.6 (L) 8.9 - 10.3 mg/dL   Phosphorus 3.1 2.5 - 4.6 mg/dL   Albumin 2.8 (L) 3.5 - 5.0 g/dL   GFR, Estimated >60 >60 mL/min    Comment: (NOTE) Calculated using the CKD-EPI Creatinine Equation (2021)    Anion gap 4 (L) 5 - 15    Comment: Performed at [facility] [address], [place], [place] [address]  Magnesium     Status: None   Collection Time: 11/05/20  3:40 AM  Result Value Ref Range   Magnesium 1.9 1.7 - 2.4 mg/dL    Comment: Performed at [facility] [address],  [place], [place] [address]  Glucose, capillary     Status: Abnormal   Collection Time: 11/05/20  6:32 AM  Result Value Ref Range   Glucose-Capillary 63 (L) 70 - 99 mg/dL    Comment: Glucose reference range applies only to samples taken after fasting for at least 8 hours.   Comment 1 Notify RN    Comment 2 Document in Chart   Glucose, capillary     Status: None   Collection Time: 11/05/20  7:20 AM  Result Value Ref Range   Glucose-Capillary 98 70 - 99 mg/dL    Comment: Glucose reference range applies only to samples taken after fasting for at least 8 hours.   Comment 1 Notify RN    Comment 2 Document in Chart   Glucose, capillary     Status: Abnormal   Collection Time: 11/05/20 11:28 AM  Result Value Ref Range   Glucose-Capillary 153 (H) 70 - 99 mg/dL    Comment: Glucose reference range applies only to samples taken after fasting for at least 8 hours.  Glucose, capillary     Status: Abnormal   Collection Time: 11/05/20  4:41 PM  Result Value Ref Range   Glucose-Capillary 336 (H) 70 - 99 mg/dL    Comment: Glucose reference range applies only to samples taken after fasting for at least 8 hours.  Glucose, capillary     Status: Abnormal   Collection Time: 11/05/20  9:18 PM  Result Value Ref Range   Glucose-Capillary 234 (H) 70 - 99 mg/dL    Comment: Glucose reference range applies only to samples taken after fasting for at least 8 hours.   Comment 1 Notify RN    Comment 2 Document in Chart   Renal function panel     Status: Abnormal   Collection Time: 11/06/20  3:59 AM  Result Value Ref Range   Sodium 136 135 - 145 mmol/L   Potassium  4.1 3.5 - 5.1 mmol/L   Chloride 108 98 - 111 mmol/L   CO2 24 22 - 32 mmol/L   Glucose, Bld 130 (H) 70 - 99 mg/dL    Comment: Glucose reference range applies only to samples taken after fasting for at least 8 hours.   BUN 19 8 - 23 mg/dL   Creatinine, Ser 0.72 0.44 - 1.00 mg/dL   Calcium 8.8 (L) 8.9 - 10.3 mg/dL   Phosphorus 3.7 2.5 - 4.6 mg/dL   Albumin  2.7 (L) 3.5 - 5.0 g/dL   GFR, Estimated >60 >60 mL/min    Comment: (NOTE) Calculated using the CKD-EPI Creatinine Equation (2021)    Anion gap 4 (L) 5 - 15    Comment: Performed at [facility] [address], [place], [place] [address]  Magnesium     Status: None   Collection Time: 11/06/20  3:59 AM  Result Value Ref Range   Magnesium 1.7 1.7 - 2.4 mg/dL    Comment: Performed at [facility] [address], [place], [place] [address]  CBC     Status: Abnormal   Collection Time: 11/06/20  3:59 AM  Result Value Ref Range   WBC 18.0 (H) 4.0 - 10.5 K/uL   RBC 3.02 (L) 3.87 - 5.11 MIL/uL   Hemoglobin 8.6 (L) 12.0 - 15.0 g/dL   HCT 28.0 (L) 36.0 - 46.0 %   MCV 92.7 80.0 - 100.0 fL   MCH 28.5 26.0 - 34.0 pg   MCHC 30.7 30.0 - 36.0 g/dL   RDW 19.1 (H) 11.5 - 15.5 %   Platelets 8 (LL) 150 - 400 K/uL    Comment: Immature Platelet Fraction may be clinically indicated, consider ordering this additional test [id number] CRITICAL VALUE NOTED.  VALUE IS CONSISTENT WITH PREVIOUSLY REPORTED AND CALLED VALUE. REPEATED TO VERIFY    nRBC 0.3 (H) 0.0 - 0.2 %    Comment: Performed at [facility], [place] [address] [place], [place] [address]  Glucose, capillary     Status: None   Collection Time: 11/06/20  7:17 AM  Result Value Ref Range   Glucose-Capillary 77 70 - 99 mg/dL    Comment: Glucose reference range applies only to samples taken after fasting for at least 8 hours.  Prepare Pheresed Platelets     Status: None (Preliminary result)   Collection Time: 11/06/20  8:01 AM  Result Value Ref Range   Unit Number [id number]    Blood Component Type PLTP1 PSORALEN TREATED    Unit division 00    Status of Unit ISSUED    Transfusion Status      OK TO TRANSFUSE Performed at [facility] [address], [place], [place] [address]    Unit Number [id number]    Blood Component Type PLTP2 PSORALEN TREATED    Unit division 00    Status of Unit ALLOCATED    Transfusion  Status OK TO TRANSFUSE   Type and screen [place]     Status: None   Collection Time: 11/06/20  8:13 AM  Result Value Ref Range   ABO/RH(D) O POS    Antibody Screen POS    Sample Expiration      11/09/2020,2359 Performed at [facility], [place] [address], [place], [place] [address]    No results found.    Assessment/Plan T2DM with diabetic polyneuropathy HLD HTN pernicious anemia/B12 deficiency NAFLD medical noncompliance  ITP Lymphadenopathy and splenomegaly  -  platelets 8K today from 11K yesterday - s/p IVIG, was on prednisone 60 mg daily - weaned down to 40 mg prednisone today  - s/p Nplate 5/26 - Bone marrow shows increased blood cells with some abnormalities.  There was no evidence of acute leukemia or high-grade lymphoma.  However definitive diagnosis not able to be made at this time based on bone marrow biopsy. - LN biopsy was non-diagnostic - oncology recommending excisional LN bx - will discuss with attending provider but likely monitor platelets over the weekend and will readdress need for possible LN biopsy next week  FEN: CM diet  VTE: on hold in setting of anemia and thrombocytopenia  ID: no current abx   Norm Parcel, [facility] Surgery 11/06/2020, 11:17 AM Please see Amion for pager number during day hours 7:00am-4:30pm

## 2020-11-06 NOTE — Progress Notes (Addendum)
HEMATOLOGY-ONCOLOGY PROGRESS NOTE  SUBJECTIVE: Offers no specific complaints today.  No bleeding reported.  Receiving 2 units of platelets today for platelet count of 8000 this morning.  The patient was seen by general surgery just prior to my visit for consideration of excisional lymph node biopsy.  They are holding off on excisional lymph node biopsy for now due to significant thrombocytopenia.  They plan to reevaluate early next week.  REVIEW OF SYSTEMS:   10 Point review of Systems was done is negative except as noted above.  I have reviewed the past medical history, past surgical history, social history and family history with the patient and they are unchanged from previous note.   PHYSICAL EXAMINATION: ECOG PERFORMANCE STATUS: 2 - Symptomatic, <50% confined to bed  Vitals:   11/06/20 0900 11/06/20 1015  BP: (!) 122/58 127/65  Pulse: 84 88  Resp: 18 18  Temp: 98 F (36.7 C) 98.1 F (36.7 C)  SpO2: 98% 98%   Filed Weights   10/29/20 0125 10/31/20 2044 11/02/20 2111  Weight: 75.7 kg 77.3 kg 77.3 kg    Intake/Output from previous day: 05/26 0701 - 05/27 0700 In: 600 [P.O.:600] Out: 0    GENERAL:alert, in no acute distress and comfortable SKIN: Hematoma right axilla, no rashes EYES: conjunctiva are pink and non-injected, sclera anicteric OROPHARYNX: MMM, no exudates, no oropharyngeal erythema or ulceration NECK: supple, no JVD LYMPH: Palpable cervical, supraclavicular, axillary lymphadenopathy LUNGS: clear to auscultation b/l with normal respiratory effort HEART: regular rate & rhythm ABDOMEN:  normoactive bowel sounds , non tender, not distended. Extremity: no pedal edema PSYCH: alert & oriented x 3 with fluent speech NEURO: no focal motor/sensory deficits   LABORATORY DATA:  I have reviewed the data as listed CMP Latest Ref Rng & Units 11/06/2020 11/05/2020 10/29/2020  Glucose 70 - 99 mg/dL 130(H) 66(L) 116(H)  BUN 8 - 23 mg/dL $Remove'19 19 10  'nSEZNfF$ Creatinine 0.44 - 1.00  mg/dL 0.72 0.82 0.77  Sodium 135 - 145 mmol/L 136 138 139  Potassium 3.5 - 5.1 mmol/L 4.1 4.1 3.4(L)  Chloride 98 - 111 mmol/L 108 106 108  CO2 22 - 32 mmol/L $RemoveB'24 28 26  'OWYVxbST$ Calcium 8.9 - 10.3 mg/dL 8.8(L) 8.6(L) 8.0(L)  Total Protein 6.5 - 8.1 g/dL - - 7.8  Total Bilirubin 0.3 - 1.2 mg/dL - - 0.9  Alkaline Phos 38 - 126 U/L - - 40  AST 15 - 41 U/L - - 16  ALT 0 - 44 U/L - - 9    Lab Results  Component Value Date   WBC 18.0 (H) 11/06/2020   HGB 8.6 (L) 11/06/2020   HCT 28.0 (L) 11/06/2020   MCV 92.7 11/06/2020   PLT 8 (LL) 11/06/2020   NEUTROABS 5.8 11/01/2020    CT ABDOMEN PELVIS WO CONTRAST  Result Date: 10/29/2020 CLINICAL DATA:  Hematologic malignancy, staging. EXAM: CT CHEST, ABDOMEN AND PELVIS WITHOUT CONTRAST TECHNIQUE: Multidetector CT imaging of the chest, abdomen and pelvis was performed following the standard protocol without IV contrast. COMPARISON:  History of MRI diarrhea neck no palpable status chest left at [facility] 7 right-sided than she thickened change ir indications sick concern for insert at the right 6 arm. FINDINGS: CT CHEST FINDINGS Cardiovascular: The aortic and branch vessel atherosclerosis without aneurysmal dilation. Normal size heart. No significant pericardial effusion/thickening. Mediastinum/Nodes: Enlarged supraclavicular, bilateral axillary and left mammary lymph nodes. For reference: Left internal mammary lymph node measures 1.9 by 1.2 cm on image 52/7. Right axillary lymph node measures  3.1 x 2.3 cm on image 36/7. Prominent mediastinal lymph nodes measuring up to 9 mm in short axis on image 39/7, without adenopathy by size criteria. Thyroid is grossly unremarkable. Trachea esophagus are grossly unremarkable. Lungs/Pleura: Right lower lobe calcified granuloma. Bibasilar atelectasis. Scattered bilateral micro nodules measuring 1-2 mm for instance on image 96/4 in the left lower lobe and image 72/4 in the right upper lobe. Musculoskeletal: Peripherally calcified  nodule in the right axilla on image 22/3. No acute osseous abnormality. CT ABDOMEN PELVIS FINDINGS Hepatobiliary: Grossly unremarkable noncontrast appearance of the hepatic parenchyma. No hepatomegaly. Gallbladder is distended with some layering hyperdense material representing sludge and tiny stones seen on prior ultrasound. No biliary ductal dilation. Pancreas: Within normal limits. Spleen: Splenomegaly measuring 22 cm in maximum craniocaudal dimension. Adrenals/Urinary Tract: Adrenal glands are unremarkable. Kidneys are normal, without renal calculi, contour deforming lesion, or hydronephrosis. Bladder is unremarkable. Stomach/Bowel: Prior gastric bypass surgery. Enteric contrast visualized to the level of the distal small bowel. No pathologic dilation of small bowel. Appendix is grossly unremarkable. Colonic diverticulosis without findings of acute diverticulitis. Vascular/Lymphatic: Aortic atherosclerosis without aneurysmal dilation. No significant interval change in the enlarged and prominent upper abdominal, retroperitoneal and mesenteric lymph nodes. Interval increase in size of the few enlarged and prominent pelvic lymph nodes. Reference lymph nodes are as follows: Periportal lymph node measuring 3.3 x 1.7 cm on image 54/3, unchanged. Aortocaval lymph node measures 1.6 x 1.5 cm on image 69/3, unchanged. Right pelvic sidewall lymph node measures 3.1 x 1.6 cm on image 109/3. Reproductive: Uterus and bilateral adnexa are unremarkable. Other: Small volume abdominopelvic ascites with diffuse mesenteric stranding. Multiple peritoneal calcifications are again visualized and unchanged from prior, likely sequela prior inflammation. Similar appearance of the nonspecific anterior abdominal wall subcutaneous calcifications. Mild diffuse subcutaneous edema. Musculoskeletal: Multilevel degenerative changes spine. No acute osseous abnormality. IMPRESSION: 1. Splenomegaly with pathologically enlarged lymph nodes above and  below the diaphragm, with overall stable to minimally increased abdominal adenopathy and interval progression of the pelvic adenopathy. 2. Small volume abdominopelvic ascites with diffuse mesenteric stranding. 3. Scattered bilateral pulmonary micro nodules measuring 1-2 mm. 4. Distended gallbladder with some layering hyperdense material representing layering sludge and tiny stones seen on prior ultrasound. 5. Aortic atherosclerosis. Aortic Atherosclerosis (ICD10-I70.0). Electronically Signed   By: Dahlia Bailiff MD   On: 10/29/2020 19:53   CT CHEST WO CONTRAST  Result Date: 10/29/2020 CLINICAL DATA:  Hematologic malignancy, staging. EXAM: CT CHEST, ABDOMEN AND PELVIS WITHOUT CONTRAST TECHNIQUE: Multidetector CT imaging of the chest, abdomen and pelvis was performed following the standard protocol without IV contrast. COMPARISON:  History of MRI diarrhea neck no palpable status chest left at [facility] 7 right-sided than she thickened change ir indications sick concern for insert at the right 6 arm. FINDINGS: CT CHEST FINDINGS Cardiovascular: The aortic and branch vessel atherosclerosis without aneurysmal dilation. Normal size heart. No significant pericardial effusion/thickening. Mediastinum/Nodes: Enlarged supraclavicular, bilateral axillary and left mammary lymph nodes. For reference: Left internal mammary lymph node measures 1.9 by 1.2 cm on image 52/7. Right axillary lymph node measures 3.1 x 2.3 cm on image 36/7. Prominent mediastinal lymph nodes measuring up to 9 mm in short axis on image 39/7, without adenopathy by size criteria. Thyroid is grossly unremarkable. Trachea esophagus are grossly unremarkable. Lungs/Pleura: Right lower lobe calcified granuloma. Bibasilar atelectasis. Scattered bilateral micro nodules measuring 1-2 mm for instance on image 96/4 in the left lower lobe and image 72/4 in the right upper lobe. Musculoskeletal: Peripherally calcified  nodule in the right axilla on image 22/3. No acute  osseous abnormality. CT ABDOMEN PELVIS FINDINGS Hepatobiliary: Grossly unremarkable noncontrast appearance of the hepatic parenchyma. No hepatomegaly. Gallbladder is distended with some layering hyperdense material representing sludge and tiny stones seen on prior ultrasound. No biliary ductal dilation. Pancreas: Within normal limits. Spleen: Splenomegaly measuring 22 cm in maximum craniocaudal dimension. Adrenals/Urinary Tract: Adrenal glands are unremarkable. Kidneys are normal, without renal calculi, contour deforming lesion, or hydronephrosis. Bladder is unremarkable. Stomach/Bowel: Prior gastric bypass surgery. Enteric contrast visualized to the level of the distal small bowel. No pathologic dilation of small bowel. Appendix is grossly unremarkable. Colonic diverticulosis without findings of acute diverticulitis. Vascular/Lymphatic: Aortic atherosclerosis without aneurysmal dilation. No significant interval change in the enlarged and prominent upper abdominal, retroperitoneal and mesenteric lymph nodes. Interval increase in size of the few enlarged and prominent pelvic lymph nodes. Reference lymph nodes are as follows: Periportal lymph node measuring 3.3 x 1.7 cm on image 54/3, unchanged. Aortocaval lymph node measures 1.6 x 1.5 cm on image 69/3, unchanged. Right pelvic sidewall lymph node measures 3.1 x 1.6 cm on image 109/3. Reproductive: Uterus and bilateral adnexa are unremarkable. Other: Small volume abdominopelvic ascites with diffuse mesenteric stranding. Multiple peritoneal calcifications are again visualized and unchanged from prior, likely sequela prior inflammation. Similar appearance of the nonspecific anterior abdominal wall subcutaneous calcifications. Mild diffuse subcutaneous edema. Musculoskeletal: Multilevel degenerative changes spine. No acute osseous abnormality. IMPRESSION: 1. Splenomegaly with pathologically enlarged lymph nodes above and below the diaphragm, with overall stable to  minimally increased abdominal adenopathy and interval progression of the pelvic adenopathy. 2. Small volume abdominopelvic ascites with diffuse mesenteric stranding. 3. Scattered bilateral pulmonary micro nodules measuring 1-2 mm. 4. Distended gallbladder with some layering hyperdense material representing layering sludge and tiny stones seen on prior ultrasound. 5. Aortic atherosclerosis. Aortic Atherosclerosis (ICD10-I70.0). Electronically Signed   By: Dahlia Bailiff MD   On: 10/29/2020 19:53   CT BIOPSY  Result Date: 10/30/2020 INDICATION: 70 year old female presents for ultrasound-guided right axillary node biopsy EXAM: ULTRASOUND-GUIDED RIGHT AXILLARY NODE BIOPSY MEDICATIONS: None. ANESTHESIA/SEDATION: Moderate (conscious) sedation was employed during this procedure, as the same moderate sedation that was performed during the accession number [id number]. Same sedation time for sequential procedures. A total of Versed 3.0 mg and Fentanyl 125 mcg was administered intravenously. Moderate Sedation Time: 42 minutes. The patient's level of consciousness and vital signs were monitored continuously by radiology nursing throughout the procedure under my direct supervision. FLUOROSCOPY TIME:  ULTRASOUND COMPLICATIONS: NONE PROCEDURE: Informed written consent was obtained from the patient after a thorough discussion of the procedural risks, benefits and alternatives. All questions were addressed. Maximal Sterile Barrier Technique was utilized including caps, mask, sterile gowns, sterile gloves, sterile drape, hand hygiene and skin antiseptic. A timeout was performed prior to the initiation of the procedure. After a bone marrow biopsy was performed under CT guidance, the patient was turned into the supine position on her stretcher. Ultrasound images of the right axillary region were performed with images stored. The right axillary region was prepped with chlorhexidine in a sterile fashion, and a sterile drape was  applied covering the operative field. A sterile gown and sterile gloves were used for the procedure. Local anesthesia was provided with 1% Lidocaine. Ultrasound guidance was used to infiltrate the region with 1% lidocaine for local anesthesia. Five separate 18 gauge core biopsy were then acquired of the right axillary node using ultrasound guidance. Specimens placed into fresh specimen/saline. Patient tolerated the procedure well  and remained hemodynamically stable throughout. No complications were encountered and no significant blood loss was encounter IMPRESSION: Status post ultrasound-guided biopsy of right axillary lymph node. Signed, Dulcy Fanny. Dellia Nims, RPVI [facility] Radiology Electronically Signed   By: Corrie Mckusick D.O.   On: 10/30/2020 11:05   CT BONE MARROW BIOPSY & ASPIRATION  Result Date: 10/30/2020 INDICATION: 70 year old female referred for bone marrow biopsy EXAM: CT BONE MARROW BIOPSY AND ASPIRATION MEDICATIONS: None. ANESTHESIA/SEDATION: Moderate (conscious) sedation was employed during this procedure. A total of Versed 3.0 mg and Fentanyl 125 mcg was administered intravenously. Moderate Sedation Time: 42 minutes. The patient's level of consciousness and vital signs were monitored continuously by radiology nursing throughout the procedure under my direct supervision. FLUOROSCOPY TIME:  CT COMPLICATIONS: None PROCEDURE: The procedure risks, benefits, and alternatives were explained to the patient. Questions regarding the procedure were encouraged and answered. The patient understands and consents to the procedure. Scout CT of the pelvis was performed for surgical planning purposes. The posterior pelvis was prepped with Chlorhexidine in a sterile fashion, and a sterile drape was applied covering the operative field. A sterile gown and sterile gloves were used for the procedure. Local anesthesia was provided with 1% Lidocaine. Posterior right  iliac bone was targeted for biopsy. The skin and subcutaneous tissues were infiltrated with 1% lidocaine without epinephrine. A small stab incision was made with an 11 blade scalpel, and an 11 gauge Murphy needle was advanced with CT guidance to the posterior cortex. Manual forced was used to advance the needle through the posterior cortex and the stylet was removed. A bone marrow aspirate was retrieved and passed to a cytotechnologist in the room. The Murphy needle was then advanced without the stylet for a core biopsy. The core biopsy was retrieved and also passed to a cytotechnologist. Manual pressure was used for hemostasis and a sterile dressing was placed. No complications were encountered no significant blood loss was encountered. Patient tolerated the procedure well and remained hemodynamically stable throughout. After the bone marrow biopsy was completed, the patient was then positioned supine on her stretcher and we proceeded with the next staff of the planned case, a right axillary lymph node ultrasound-guided biopsy. The same sedation time is applied for both procedures. IMPRESSION: Status post CT-guided bone marrow biopsy, with tissue specimen sent to pathology for complete histopathologic analysis Signed, Dulcy Fanny. Earleen Newport, DO [facility] Radiology Electronically Signed   By: Corrie Mckusick D.O.   On: 10/30/2020 11:02   US Abdomen Limited RUQ (LIVER/GB)  Result Date: 10/28/2020 CLINICAL DATA:  Follow-up cirrhosis EXAM: ULTRASOUND ABDOMEN LIMITED RIGHT UPPER QUADRANT COMPARISON:  09/05/2019 FINDINGS: Gallbladder: Gallbladder is well distended with gallbladder sludge within. Previously seen calculi are noted within the dependent sludge. No wall thickening or pericholecystic fluid is noted. Negative sonographic Murphy's sign is elicited. Common bile duct: Diameter: 2.3 mm. Liver: No focal lesion identified. Within normal limits in parenchymal echogenicity.  Portal vein is patent on color Doppler imaging with normal direction of blood flow towards the liver. Other: None. IMPRESSION: Gallbladder sludge and cholelithiasis. No complicating factors are noted. Electronically Signed   By: Inez Catalina M.D.   On: 10/28/2020 23:34    ASSESSMENT AND PLAN:  Ms. Kratz is a 70 year old female with history of ITP and lymphadenopathy who presented to the hospital with severe thrombocytopenia and anemia.  1. Severe thrombocytopenia 2. Normocytic anemia 3. Lymphadenopathy and splenomegaly concerning for neoplastic process - ?  Low grade NHL vs other etiology like sarcoidosis. 4. Type 2 diabetes mellitus with polyneuropathy 5.  Hypertension  PLAN -Status post IVIG and prednisone.  She was on 60 mg daily without significant improvement and has now been weaned down to 40 mg daily. -Platelet count 8000 today.  Receiving 2 units of platelets. -Received Nplate 3 mcg/kg 8/92.  Plan to continue this weekly. -continue to transfuse PRBC for hgb<7.5 or if symptomatic -transfuse platelets for PLT count <10k or if bleeding -Discussed with the patient and her family that her low platelets are likely related to ITP. -Bone marrow biopsy results shows increased blood cells with some abnormalities.  There was no evidence of acute leukemia or high-grade lymphoma.  However definitive diagnosis not able to be made at this time based on bone marrow biopsy.  Differentials include MPN/MDS versus [name].  We will marrow biopsy has been sent for cytogenetics and FISH panel.  This may take up to 2 weeks to result.  We will plan to discuss the final results once they are available to [place]. -Lymph node biopsy nondiagnostic.  General surgery consulted and will consider excisional lymph node biopsy if improvement in her thrombocytopenia. -Prednisone is currently being tapered due to not having significant response.  Currently on 40 mg daily.  Further tapering Dr. Burr Medico. -Okay to discharge to home  if platelet count above 15,000.  We will plan for close outpatient follow-up in our clinic.  Mikey Bussing  11/06/2020 1:08 PM   Addendum  Pt is clinically stable, has multiple ecchymosis on forearms, no other active bleeding.  Platelet count was 8k this morning, will give 1 unit platelet.  I reviewed her BM biopsy results with Dr. 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 Her diffuse adenopathy is likely different process, I recommend surgical lymph node biopsy before we offer her chemo for [name]. I spoke with Dr. Donne Hazel today.  Hopefully her platelet count would gradually increase after second dose of Nplate yesterday, and became sufficient for surgical biopsy early next week.  I appreciate the surgical team's assistance.  Please taper off prednisone gradually in then next week and watch her BG when tapering prednisone. I will change to $RemoveB'20mg'[id number]$  tomorrow, and the rest tapering per primary team. Please give 1u plt transfusion if plt<10K.  Thanks much. I will ask my on-call partner to see her over the weekend.   Truitt Merle  11/06/2020

## 2020-11-06 NOTE — Care Management Important Message (Signed)
Important Message  Patient Details  Name: Kathleen Gibson MRN: [id number] Date of Birth: 01-Mar-1951   Medicare Important Message Given:  Yes - Important Message mailed due to current National Emergency   Verbal consent obtained due to current National Emergency  Relationship to patient: Self Contact Name: Niki Call Date: 11/06/20  Time: 0902 Phone: [phone] Outcome: No Answer/Busy Important Message mailed to: Patient address on file    Delorse Lek 11/06/2020, 9:02 AM

## 2020-11-07 DIAGNOSIS — D693 Immune thrombocytopenic purpura: Secondary | ICD-10-CM | POA: Diagnosis not present

## 2020-11-07 DIAGNOSIS — I10 Essential (primary) hypertension: Secondary | ICD-10-CM | POA: Diagnosis not present

## 2020-11-07 DIAGNOSIS — R591 Generalized enlarged lymph nodes: Secondary | ICD-10-CM | POA: Diagnosis not present

## 2020-11-07 DIAGNOSIS — Z9119 Patient's noncompliance with other medical treatment and regimen: Secondary | ICD-10-CM | POA: Diagnosis not present

## 2020-11-07 LAB — BPAM PLATELET PHERESIS
Blood Product Expiration Date: 202205292359
Blood Product Expiration Date: 202205292359
ISSUE DATE / TIME: 202205270816
ISSUE DATE / TIME: 202205271328
Unit Type and Rh: 6200
Unit Type and Rh: 6200

## 2020-11-07 LAB — GLUCOSE, CAPILLARY
Glucose-Capillary: 144 mg/dL — ABNORMAL HIGH (ref 70–99)
Glucose-Capillary: 166 mg/dL — ABNORMAL HIGH (ref 70–99)
Glucose-Capillary: 51 mg/dL — ABNORMAL LOW (ref 70–99)
Glucose-Capillary: 52 mg/dL — ABNORMAL LOW (ref 70–99)
Glucose-Capillary: 80 mg/dL (ref 70–99)
Glucose-Capillary: 89 mg/dL (ref 70–99)

## 2020-11-07 LAB — CBC
HCT: 28.8 % — ABNORMAL LOW (ref 36.0–46.0)
HCT: 29.8 % — ABNORMAL LOW (ref 36.0–46.0)
Hemoglobin: 8.7 g/dL — ABNORMAL LOW (ref 12.0–15.0)
Hemoglobin: 9.3 g/dL — ABNORMAL LOW (ref 12.0–15.0)
MCH: 27.8 pg (ref 26.0–34.0)
MCH: 28.4 pg (ref 26.0–34.0)
MCHC: 30.2 g/dL (ref 30.0–36.0)
MCHC: 31.2 g/dL (ref 30.0–36.0)
MCV: 92 fL (ref 80.0–100.0)
MCV: 90.9 fL (ref 80.0–100.0)
Platelets: 6 K/uL — CL (ref 150–400)
Platelets: 6 10*3/uL — CL (ref 150–400)
RBC: 3.13 MIL/uL — ABNORMAL LOW (ref 3.87–5.11)
RBC: 3.28 MIL/uL — ABNORMAL LOW (ref 3.87–5.11)
RDW: 19.1 % — ABNORMAL HIGH (ref 11.5–15.5)
RDW: 18.6 % — ABNORMAL HIGH (ref 11.5–15.5)
WBC: 20.3 K/uL — ABNORMAL HIGH (ref 4.0–10.5)
WBC: 21.7 10*3/uL — ABNORMAL HIGH (ref 4.0–10.5)
nRBC: 0.3 % — ABNORMAL HIGH (ref 0.0–0.2)
nRBC: 0.3 % — ABNORMAL HIGH (ref 0.0–0.2)

## 2020-11-07 LAB — PREPARE PLATELET PHERESIS
Unit division: 0
Unit division: 0

## 2020-11-07 MED ORDER — SODIUM CHLORIDE 0.9% IV SOLUTION
Freq: Once | INTRAVENOUS | Status: AC
  Administered 2020-11-07: 10 mL/h via INTRAVENOUS

## 2020-11-07 MED ORDER — INSULIN ASPART 100 UNIT/ML IJ SOLN
2.0000 [IU] | Freq: Three times a day (TID) | INTRAMUSCULAR | Status: DC
  Administered 2020-11-07: 2 [IU] via SUBCUTANEOUS

## 2020-11-07 MED ORDER — SODIUM CHLORIDE 0.9% IV SOLUTION: Freq: Once | INTRAVENOUS | Status: AC

## 2020-11-07 MED ORDER — INSULIN GLARGINE 100 UNIT/ML ~~LOC~~ SOLN
20.0000 [IU] | Freq: Every day | SUBCUTANEOUS | Status: DC
  Administered 2020-11-07: 20 [IU] via SUBCUTANEOUS
  Filled 2020-11-07 (×2): qty 0.2

## 2020-11-07 MED ORDER — INSULIN ASPART 100 UNIT/ML IJ SOLN: 4.0000 [IU] | Freq: Three times a day (TID) | INTRAMUSCULAR | Status: DC

## 2020-11-07 MED ORDER — PREDNISONE 20 MG PO TABS
40.0000 mg | ORAL_TABLET | Freq: Every day | ORAL | Status: DC
  Administered 2020-11-08 – 2020-11-10 (×3): 40 mg via ORAL
  Filled 2020-11-07 (×3): qty 2

## 2020-11-07 NOTE — Evaluation (Signed)
Physical Therapy Evaluation Patient Details Name: Kathleen Gibson MRN: [id number] DOB: [birth year] Today's Date: 11/07/2020   History of Present Illness  The pt is a 70 yo female presenting 5/18 after being sent by PCP for low platelets. Pt given 1 pack platelets and started on IVIG upon admission. PMH includes: DM II, diabetic polyneuropathy, ITP, gastric bypass, HTN, anemia, and renal insufficiency.    Clinical Impression  Pt in bed upon arrival of PT, agreeable to evaluation at this time. Prior to admission the pt was completely independent, loved walking outside and working in garden. She lives in a home with 2-3 steps to enter with her niece. The pt now presents with minor limitations in functional mobility and dynamic stability due to above dx, but is safe to return home with family assist as needed when medically cleared. The pt was able to demo good independence with all bed mobility, transfers, and hallway ambulation without need for AD or any evidence of instability. The pt also completed a flight of stairs without any assist, but did rely on single UE support on rail. The pt states she is at her baseline level of mobility, is eager to return home to recover from the comfort of her home. She has no acute PT needs and no need for follow-up therapies after d/c. The pt is safe to mobilize with family while in house, thank you for the consult.       Follow Up Recommendations No PT follow up    Equipment Recommendations  None recommended by PT    Recommendations for Other Services       Precautions / Restrictions Precautions Precautions: None Restrictions Weight Bearing Restrictions: No      Mobility  Bed Mobility Overal bed mobility: Independent             General bed mobility comments: sitting EOB on entry    Transfers Overall transfer level: Independent Equipment used: None             General transfer comment: no assist or evidence of  instability  Ambulation/Gait Ambulation/Gait assistance: Independent Gait Distance (Feet): 250 Feet Assistive device: None Gait Pattern/deviations: WFL(Within Functional Limits) Gait velocity: 0.9 m/s Gait velocity interpretation: 1.31 - 2.62 ft/sec, indicative of limited community ambulator General Gait Details: supervision during session for safety, no LOB other than with vertical head turns  Stairs Stairs: Yes Stairs assistance: Supervision Stair Management: One rail Right;Alternating pattern;Forwards Number of Stairs: 12 General stair comments: no evidence of instability with single UE support on rail      Balance Overall balance assessment: No apparent balance deficits (not formally assessed)                               Standardized Balance Assessment Standardized Balance Assessment : Dynamic Gait Index   Dynamic Gait Index Level Surface: Normal Change in Gait Speed: Normal Gait with Horizontal Head Turns: Normal Gait with Vertical Head Turns: Moderate Impairment Gait and Pivot Turn: Normal Step Over Obstacle: Normal Step Around Obstacles: Normal Steps: Mild Impairment Total Score: 21       Pertinent Vitals/Pain Pain Assessment: No/denies pain    Home Living Family/patient expects to be discharged to:: Private residence Living Arrangements: Other (Comment) (niece) Available Help at Discharge: Family;Available PRN/intermittently Type of Home: House Home Access: Stairs to enter Entrance Stairs-Rails: Psychiatric nurse of Steps: 2-3 Home Layout: Two level Home Equipment: Grab bars - tub/shower  Prior Function Level of Independence: Independent         Comments: Enjoys gardening, watching 25 y/o great nephew, drives 10 hours to/from [place] to visit family/friends     Hand Dominance   Dominant Hand: Right    Extremity/Trunk Assessment   Upper Extremity Assessment Upper Extremity Assessment: Overall WFL for  tasks assessed    Lower Extremity Assessment Lower Extremity Assessment: Overall WFL for tasks assessed    Cervical / Trunk Assessment Cervical / Trunk Assessment: Normal  Communication   Communication: No difficulties  Cognition Arousal/Alertness: Awake/alert Behavior During Therapy: WFL for tasks assessed/performed Overall Cognitive Status: Within Functional Limits for tasks assessed                                        General Comments General comments (skin integrity, edema, etc.): VSS, pt reports no change in physical abilities since admission    Exercises     Assessment/Plan    PT Assessment Patent does not need any further PT services  PT Problem List         PT Treatment Interventions      PT Goals (Current goals can be found in the Care Plan section)  Acute Rehab PT Goals Patient Stated Goal: go home as soon as she can PT Goal Formulation: With patient Time For Goal Achievement: 11/14/20 Potential to Achieve Goals: Good     AM-PAC PT "6 Clicks" Mobility  Outcome Measure Help needed turning from your back to your side while in a flat bed without using bedrails?: None Help needed moving from lying on your back to sitting on the side of a flat bed without using bedrails?: None Help needed moving to and from a bed to a chair (including a wheelchair)?: None Help needed standing up from a chair using your arms (e.g., wheelchair or bedside chair)?: None Help needed to walk in hospital room?: None Help needed climbing 3-5 steps with a railing? : A Little 6 Click Score: 23    End of Session   Activity Tolerance: Patient tolerated treatment well Patient left: in bed Nurse Communication: Mobility status PT Visit Diagnosis: Unsteadiness on feet (R26.81)    Time: 9038-3338 PT Time Calculation (min) (ACUTE ONLY): 11 min   Charges:   PT Evaluation $PT Eval Low Complexity: 1 Low          Karma Ganja, PT, DPT   Acute Rehabilitation  Department Pager #: [phone]  Otho Bellows 11/07/2020, 1:30 PM

## 2020-11-07 NOTE — Progress Notes (Signed)
HEMATOLOGY-ONCOLOGY PROGRESS NOTE  SUBJECTIVE: This morning Kathleen Gibson is accompanied by her daughter. She reports some modest bleeding from the IV site on her right arm. She notes no bleeding elsewhere. Otherwise she has been vitally stable with no changes in her health. She notes she is anxious about her low platelets. She had no other questions or complaints.   REVIEW OF SYSTEMS:   10 Point review of Systems was done is negative except as noted above.  I have reviewed the past medical history, past surgical history, social history and family history with the patient and they are unchanged from previous note.   PHYSICAL EXAMINATION: ECOG PERFORMANCE STATUS: 2 - Symptomatic, <50% confined to bed  Vitals:   11/07/20 0641 11/07/20 0923  BP: (!) 107/59 120/60  Pulse: 81 88  Resp: 19 17  Temp: 98.4 F (36.9 C) 99 F (37.2 C)  SpO2: 98% 98%   Filed Weights   10/29/20 0125 10/31/20 2044 11/02/20 2111  Weight: 166 lb 14.2 oz (75.7 kg) 170 lb 6.7 oz (77.3 kg) 170 lb 6.7 oz (77.3 kg)    Intake/Output from previous day: 05/27 0701 - 05/28 0700 In: 2189.7 [P.O.:860; Blood:1329.7] Out: 0    GENERAL:alert, in no acute distress and comfortable SKIN: Hematoma right axilla, no rashes EYES: conjunctiva are pink and non-injected, sclera anicteric OROPHARYNX: MMM, no exudates, no oropharyngeal erythema or ulceration NECK: supple, no JVD LUNGS: clear to auscultation b/l with normal respiratory effort HEART: regular rate & rhythm Extremity: no pedal edema PSYCH: alert & oriented x 3 with fluent speech NEURO: no focal motor/sensory deficits   LABORATORY DATA:  I have reviewed the data as listed CMP Latest Ref Rng & Units 11/06/2020 11/05/2020 10/29/2020  Glucose 70 - 99 mg/dL 130(H) 66(L) 116(H)  BUN 8 - 23 mg/dL _0 Creatinine 0.44 - 1.00 mg/dL 0.72 0.82 0.77  Sodium 135 - 145 mmol/L 136 138 139  Potassium 3.5 - 5.1 mmol/L 4.1 4.1 3.4(L)  Chloride 98 - 111 mmol/L 108 106 108   CO2 22 - 32 mmol/L _1 Calcium 8.9 - 10.3 mg/dL 8.8(L) 8.6(L) 8.0(L)  Total Protein 6.5 - 8.1 g/dL - - 7.8  Total Bilirubin 0.3 - 1.2 mg/dL - - 0.9  Alkaline Phos 38 - 126 U/L - - 40  AST 15 - 41 U/L - - 16  ALT 0 - 44 U/L - - 9    Lab Results  Component Value Date   WBC 21.7 (H) 11/07/2020   HGB 9.3 (L) 11/07/2020   HCT 29.8 (L) 11/07/2020   MCV 90.9 11/07/2020   PLT 6 (LL) 11/07/2020   NEUTROABS 5.8 11/01/2020    CT ABDOMEN PELVIS WO CONTRAST  Result Date: 10/29/2020 CLINICAL DATA:  Hematologic malignancy, staging. EXAM: CT CHEST, ABDOMEN AND PELVIS WITHOUT CONTRAST TECHNIQUE: Multidetector CT imaging of the chest, abdomen and pelvis was performed following the standard protocol without IV contrast. COMPARISON:  History of MRI diarrhea neck no palpable status chest left at [facility] 7 right-sided than she thickened change ir indications sick concern for insert at the right 6 arm. FINDINGS: CT CHEST FINDINGS Cardiovascular: The aortic and branch vessel atherosclerosis without aneurysmal dilation. Normal size heart. No significant pericardial effusion/thickening. Mediastinum/Nodes: Enlarged supraclavicular, bilateral axillary and left mammary lymph nodes. For reference: Left internal mammary lymph node measures 1.9 by 1.2 cm on image 52/7. Right axillary lymph node measures 3.1 x 2.3 cm on image 36/7. Prominent mediastinal lymph nodes measuring up to  9 mm in short axis on image 39/7, without adenopathy by size criteria. Thyroid is grossly unremarkable. Trachea esophagus are grossly unremarkable. Lungs/Pleura: Right lower lobe calcified granuloma. Bibasilar atelectasis. Scattered bilateral micro nodules measuring 1-2 mm for instance on image 96/4 in the left lower lobe and image 72/4 in the right upper lobe. Musculoskeletal: Peripherally calcified nodule in the right axilla on image 22/3. No acute osseous abnormality. CT ABDOMEN PELVIS FINDINGS Hepatobiliary: Grossly unremarkable noncontrast  appearance of the hepatic parenchyma. No hepatomegaly. Gallbladder is distended with some layering hyperdense material representing sludge and tiny stones seen on prior ultrasound. No biliary ductal dilation. Pancreas: Within normal limits. Spleen: Splenomegaly measuring 22 cm in maximum craniocaudal dimension. Adrenals/Urinary Tract: Adrenal glands are unremarkable. Kidneys are normal, without renal calculi, contour deforming lesion, or hydronephrosis. Bladder is unremarkable. Stomach/Bowel: Prior gastric bypass surgery. Enteric contrast visualized to the level of the distal small bowel. No pathologic dilation of small bowel. Appendix is grossly unremarkable. Colonic diverticulosis without findings of acute diverticulitis. Vascular/Lymphatic: Aortic atherosclerosis without aneurysmal dilation. No significant interval change in the enlarged and prominent upper abdominal, retroperitoneal and mesenteric lymph nodes. Interval increase in size of the few enlarged and prominent pelvic lymph nodes. Reference lymph nodes are as follows: Periportal lymph node measuring 3.3 x 1.7 cm on image 54/3, unchanged. Aortocaval lymph node measures 1.6 x 1.5 cm on image 69/3, unchanged. Right pelvic sidewall lymph node measures 3.1 x 1.6 cm on image 109/3. Reproductive: Uterus and bilateral adnexa are unremarkable. Other: Small volume abdominopelvic ascites with diffuse mesenteric stranding. Multiple peritoneal calcifications are again visualized and unchanged from prior, likely sequela prior inflammation. Similar appearance of the nonspecific anterior abdominal wall subcutaneous calcifications. Mild diffuse subcutaneous edema. Musculoskeletal: Multilevel degenerative changes spine. No acute osseous abnormality. IMPRESSION: 1. Splenomegaly with pathologically enlarged lymph nodes above and below the diaphragm, with overall stable to minimally increased abdominal adenopathy and interval progression of the pelvic adenopathy. 2. Small  volume abdominopelvic ascites with diffuse mesenteric stranding. 3. Scattered bilateral pulmonary micro nodules measuring 1-2 mm. 4. Distended gallbladder with some layering hyperdense material representing layering sludge and tiny stones seen on prior ultrasound. 5. Aortic atherosclerosis. Aortic Atherosclerosis (ICD10-I70.0). Electronically Signed   By: Dahlia Bailiff MD   On: 10/29/2020 19:53   CT CHEST WO CONTRAST  Result Date: 10/29/2020 CLINICAL DATA:  Hematologic malignancy, staging. EXAM: CT CHEST, ABDOMEN AND PELVIS WITHOUT CONTRAST TECHNIQUE: Multidetector CT imaging of the chest, abdomen and pelvis was performed following the standard protocol without IV contrast. COMPARISON:  History of MRI diarrhea neck no palpable status chest left at [facility] 7 right-sided than she thickened change ir indications sick concern for insert at the right 6 arm. FINDINGS: CT CHEST FINDINGS Cardiovascular: The aortic and branch vessel atherosclerosis without aneurysmal dilation. Normal size heart. No significant pericardial effusion/thickening. Mediastinum/Nodes: Enlarged supraclavicular, bilateral axillary and left mammary lymph nodes. For reference: Left internal mammary lymph node measures 1.9 by 1.2 cm on image 52/7. Right axillary lymph node measures 3.1 x 2.3 cm on image 36/7. Prominent mediastinal lymph nodes measuring up to 9 mm in short axis on image 39/7, without adenopathy by size criteria. Thyroid is grossly unremarkable. Trachea esophagus are grossly unremarkable. Lungs/Pleura: Right lower lobe calcified granuloma. Bibasilar atelectasis. Scattered bilateral micro nodules measuring 1-2 mm for instance on image 96/4 in the left lower lobe and image 72/4 in the right upper lobe. Musculoskeletal: Peripherally calcified nodule in the right axilla on image 22/3. No acute osseous abnormality. CT ABDOMEN  PELVIS FINDINGS Hepatobiliary: Grossly unremarkable noncontrast appearance of the hepatic parenchyma. No  hepatomegaly. Gallbladder is distended with some layering hyperdense material representing sludge and tiny stones seen on prior ultrasound. No biliary ductal dilation. Pancreas: Within normal limits. Spleen: Splenomegaly measuring 22 cm in maximum craniocaudal dimension. Adrenals/Urinary Tract: Adrenal glands are unremarkable. Kidneys are normal, without renal calculi, contour deforming lesion, or hydronephrosis. Bladder is unremarkable. Stomach/Bowel: Prior gastric bypass surgery. Enteric contrast visualized to the level of the distal small bowel. No pathologic dilation of small bowel. Appendix is grossly unremarkable. Colonic diverticulosis without findings of acute diverticulitis. Vascular/Lymphatic: Aortic atherosclerosis without aneurysmal dilation. No significant interval change in the enlarged and prominent upper abdominal, retroperitoneal and mesenteric lymph nodes. Interval increase in size of the few enlarged and prominent pelvic lymph nodes. Reference lymph nodes are as follows: Periportal lymph node measuring 3.3 x 1.7 cm on image 54/3, unchanged. Aortocaval lymph node measures 1.6 x 1.5 cm on image 69/3, unchanged. Right pelvic sidewall lymph node measures 3.1 x 1.6 cm on image 109/3. Reproductive: Uterus and bilateral adnexa are unremarkable. Other: Small volume abdominopelvic ascites with diffuse mesenteric stranding. Multiple peritoneal calcifications are again visualized and unchanged from prior, likely sequela prior inflammation. Similar appearance of the nonspecific anterior abdominal wall subcutaneous calcifications. Mild diffuse subcutaneous edema. Musculoskeletal: Multilevel degenerative changes spine. No acute osseous abnormality. IMPRESSION: 1. Splenomegaly with pathologically enlarged lymph nodes above and below the diaphragm, with overall stable to minimally increased abdominal adenopathy and interval progression of the pelvic adenopathy. 2. Small volume abdominopelvic ascites with diffuse  mesenteric stranding. 3. Scattered bilateral pulmonary micro nodules measuring 1-2 mm. 4. Distended gallbladder with some layering hyperdense material representing layering sludge and tiny stones seen on prior ultrasound. 5. Aortic atherosclerosis. Aortic Atherosclerosis (ICD10-I70.0). Electronically Signed   By: Dahlia Bailiff MD   On: 10/29/2020 19:53   CT BIOPSY  Result Date: 10/30/2020 INDICATION: 70 year old female presents for ultrasound-guided right axillary node biopsy EXAM: ULTRASOUND-GUIDED RIGHT AXILLARY NODE BIOPSY MEDICATIONS: None. ANESTHESIA/SEDATION: Moderate (conscious) sedation was employed during this procedure, as the same moderate sedation that was performed during the accession number [id number]. Same sedation time for sequential procedures. A total of Versed 3.0 mg and Fentanyl 125 mcg was administered intravenously. Moderate Sedation Time: 42 minutes. The patient's level of consciousness and vital signs were monitored continuously by radiology nursing throughout the procedure under my direct supervision. FLUOROSCOPY TIME:  ULTRASOUND COMPLICATIONS: NONE PROCEDURE: Informed written consent was obtained from the patient after a thorough discussion of the procedural risks, benefits and alternatives. All questions were addressed. Maximal Sterile Barrier Technique was utilized including caps, mask, sterile gowns, sterile gloves, sterile drape, hand hygiene and skin antiseptic. A timeout was performed prior to the initiation of the procedure. After a bone marrow biopsy was performed under CT guidance, the patient was turned into the supine position on her stretcher. Ultrasound images of the right axillary region were performed with images stored. The right axillary region was prepped with chlorhexidine in a sterile fashion, and a sterile drape was applied covering the operative field. A sterile gown and sterile gloves were used for the procedure. Local anesthesia was provided with 1%  Lidocaine. Ultrasound guidance was used to infiltrate the region with 1% lidocaine for local anesthesia. Five separate 18 gauge core biopsy were then acquired of the right axillary node using ultrasound guidance. Specimens placed into fresh specimen/saline. Patient tolerated the procedure well and remained hemodynamically stable throughout. No complications were encountered and no significant blood loss  was encounter IMPRESSION: Status post ultrasound-guided biopsy of right axillary lymph node. Signed, Dulcy Fanny. Dellia Nims, RPVI [facility] Radiology Electronically Signed   By: Corrie Mckusick D.O.   On: 10/30/2020 11:05   CT BONE MARROW BIOPSY & ASPIRATION  Result Date: 10/30/2020 INDICATION: 70 year old female referred for bone marrow biopsy EXAM: CT BONE MARROW BIOPSY AND ASPIRATION MEDICATIONS: None. ANESTHESIA/SEDATION: Moderate (conscious) sedation was employed during this procedure. A total of Versed 3.0 mg and Fentanyl 125 mcg was administered intravenously. Moderate Sedation Time: 42 minutes. The patient's level of consciousness and vital signs were monitored continuously by radiology nursing throughout the procedure under my direct supervision. FLUOROSCOPY TIME:  CT COMPLICATIONS: None PROCEDURE: The procedure risks, benefits, and alternatives were explained to the patient. Questions regarding the procedure were encouraged and answered. The patient understands and consents to the procedure. Scout CT of the pelvis was performed for surgical planning purposes. The posterior pelvis was prepped with Chlorhexidine in a sterile fashion, and a sterile drape was applied covering the operative field. A sterile gown and sterile gloves were used for the procedure. Local anesthesia was provided with 1% Lidocaine. Posterior right iliac bone was targeted for biopsy. The skin and subcutaneous tissues were infiltrated with 1% lidocaine without epinephrine. A small stab  incision was made with an 11 blade scalpel, and an 11 gauge Murphy needle was advanced with CT guidance to the posterior cortex. Manual forced was used to advance the needle through the posterior cortex and the stylet was removed. A bone marrow aspirate was retrieved and passed to a cytotechnologist in the room. The Murphy needle was then advanced without the stylet for a core biopsy. The core biopsy was retrieved and also passed to a cytotechnologist. Manual pressure was used for hemostasis and a sterile dressing was placed. No complications were encountered no significant blood loss was encountered. Patient tolerated the procedure well and remained hemodynamically stable throughout. After the bone marrow biopsy was completed, the patient was then positioned supine on her stretcher and we proceeded with the next staff of the planned case, a right axillary lymph node ultrasound-guided biopsy. The same sedation time is applied for both procedures. IMPRESSION: Status post CT-guided bone marrow biopsy, with tissue specimen sent to pathology for complete histopathologic analysis Signed, Dulcy Fanny. Earleen Newport, DO [facility] Radiology Electronically Signed   By: Corrie Mckusick D.O.   On: 10/30/2020 11:02   US Abdomen Limited RUQ (LIVER/GB)  Result Date: 10/28/2020 CLINICAL DATA:  Follow-up cirrhosis EXAM: ULTRASOUND ABDOMEN LIMITED RIGHT UPPER QUADRANT COMPARISON:  09/05/2019 FINDINGS: Gallbladder: Gallbladder is well distended with gallbladder sludge within. Previously seen calculi are noted within the dependent sludge. No wall thickening or pericholecystic fluid is noted. Negative sonographic Murphy's sign is elicited. Common bile duct: Diameter: 2.3 mm. Liver: No focal lesion identified. Within normal limits in parenchymal echogenicity. Portal vein is patent on color Doppler imaging with normal direction of blood flow towards the liver. Other: None. IMPRESSION: Gallbladder  sludge and cholelithiasis. No complicating factors are noted. Electronically Signed   By: Inez Catalina M.D.   On: 10/28/2020 23:34    ASSESSMENT AND PLAN:  Ms. Dunsmore is a 70 year old female with history of ITP and lymphadenopathy who presented to the hospital with severe thrombocytopenia and anemia.  1. Severe thrombocytopenia 2. Normocytic anemia 3. Lymphadenopathy and splenomegaly concerning for neoplastic process - ? Low grade NHL vs other etiology like sarcoidosis. 4. Type 2 diabetes mellitus with  polyneuropathy 5.  Hypertension  PLAN -Status post IVIG and prednisone.  She was on 60 mg daily without significant improvement and has now been weaned down to 40 mg daily. -Platelet count 6000 today, not improving.  -Received Nplate 3 mcg/kg 9/39.  Plan to continue this weekly. -continue to transfuse PRBC for hgb<7.5 or if symptomatic -transfuse platelets for PLT count <10k or if bleeding -Discussed with the patient and her family that her low platelets are likely related to ITP. -Bone marrow biopsy results shows increased blood cells with some abnormalities.  There was no evidence of acute leukemia or high-grade lymphoma.  However definitive diagnosis not able to be made at this time based on bone marrow biopsy.  Differentials include MPN/MDS versus [name].  We will marrow biopsy has been sent for cytogenetics and FISH panel.  This may take up to 2 weeks to result.  We will plan to discuss the final results once they are available to [place]. -Lymph node biopsy nondiagnostic.  General surgery consulted and will consider excisional lymph node biopsy if improvement in her thrombocytopenia. -Prednisone is currently being tapered due to not having significant response.  Currently on 40 mg daily. I would continue at this dose for now.  -Okay to discharge to home if platelet count rises above 15,000.  We will plan for close outpatient follow-up in our clinic.  Ledell Peoples, MD Department of  Hematology/Oncology [place] at [facility] Phone: [phone] Pager: [phone] Email: [email or web address]

## 2020-11-07 NOTE — Evaluation (Signed)
Occupational Therapy Evaluation/Discharge Patient Details Name: Kathleen Gibson MRN: [id number] DOB: 09-30-1950 Today's Date: 11/07/2020    History of Present Illness The pt is a 70 yo female presenting 5/18 after being sent by PCP for low platelets. Pt given 1 pack platelets and started on IVIG upon admission. PMH includes: DM II, diabetic polyneuropathy, ITP, gastric bypass, HTN, anemia, and renal insufficiency.   Clinical Impression   PTA, pt lives with family in two-level home. Pt reports Independence in all daily tasks without use of AD. Pt enjoys being active and gardening. Pt presents now at baseline - Independent in all ADLs and mobility in room without AD. No safety concerns noted and presents as low fall risk. Educated on energy conservation strategies to implement as needed. No further skilled OT services needed at acute level or on DC. OT to sign off.     Follow Up Recommendations  No OT follow up    Equipment Recommendations  None recommended by OT    Recommendations for Other Services       Precautions / Restrictions Precautions Precautions: None Restrictions Weight Bearing Restrictions: No      Mobility Bed Mobility               General bed mobility comments: sitting EOB on entry    Transfers Overall transfer level: Independent Equipment used: None                  Balance Overall balance assessment: No apparent balance deficits (not formally assessed)                                         ADL either performed or assessed with clinical judgement   ADL Overall ADL's : Independent                                       General ADL Comments: abe to don socks, brush hair at sink. Has been going to bathroom and making bed in room independently. educated on energy conservation strategies, use of shower chair if showering tasks become challenging     Vision Baseline Vision/History: Wears glasses Wears Glasses:  At all times Patient Visual Report: No change from baseline Vision Assessment?: No apparent visual deficits     Perception     Praxis      Pertinent Vitals/Pain Pain Assessment: No/denies pain     Hand Dominance Right   Extremity/Trunk Assessment Upper Extremity Assessment Upper Extremity Assessment: Overall WFL for tasks assessed   Lower Extremity Assessment Lower Extremity Assessment: Overall WFL for tasks assessed   Cervical / Trunk Assessment Cervical / Trunk Assessment: Normal   Communication Communication Communication: No difficulties   Cognition Arousal/Alertness: Awake/alert Behavior During Therapy: WFL for tasks assessed/performed Overall Cognitive Status: Within Functional Limits for tasks assessed                                     General Comments       Exercises     Shoulder Instructions      Home Living Family/patient expects to be discharged to:: Private residence Living Arrangements: Other (Comment) (niece,) Available Help at Discharge: Family;Available PRN/intermittently Type of Home: House Home Access: Stairs to enter  Entrance Stairs-Number of Steps: 2-3   Home Layout: Two level (bedroom) Alternate Level Stairs-Number of Steps: 13 Alternate Level Stairs-Rails: Right Bathroom Shower/Tub: Teacher, early years/pre: Standard     Home Equipment: Grab bars - tub/shower          Prior Functioning/Environment Level of Independence: Independent        Comments: Enjoys gardening, watching 38 y/o great nephew, drives 10 hours to/from [place] to visit family/friends        OT Problem List:        OT Treatment/Interventions:      OT Goals(Current goals can be found in the care plan section) Acute Rehab OT Goals Patient Stated Goal: go home as soon as she can OT Goal Formulation: All assessment and education complete, DC therapy  OT Frequency:     Barriers to D/C:            Co-evaluation               AM-PAC OT "6 Clicks" Daily Activity     Outcome Measure Help from another person eating meals?: None Help from another person taking care of personal grooming?: None Help from another person toileting, which includes using toliet, bedpan, or urinal?: None Help from another person bathing (including washing, rinsing, drying)?: None Help from another person to put on and taking off regular upper body clothing?: None Help from another person to put on and taking off regular lower body clothing?: None 6 Click Score: 24   End of Session Nurse Communication: Mobility status  Activity Tolerance: Patient tolerated treatment well Patient left: in bed;with call bell/phone within reach  OT Visit Diagnosis: Other abnormalities of gait and mobility (R26.89)                Time: 2458-0998 OT Time Calculation (min): 15 min Charges:  OT General Charges $OT Visit: 1 Visit OT Evaluation $OT Eval Low Complexity: 1 Low  Malachy Chamber, OTR/L Acute Rehab Services Office: [phone]  Layla Maw 11/07/2020, 9:25 AM

## 2020-11-07 NOTE — Progress Notes (Signed)
Hypoglycemic Event  CBG:  51  Treatment: 1 cup of orange juice. Breakfast  Symptoms: Asymptomatic   Follow-up CBG: Time:0730 CBG Result:80  Possible Reasons for Event: unknown   Comments/MD notified: no- asymptomatic. To be notified during rounds     Kaylyn Lim, RN, [place], [facility]

## 2020-11-07 NOTE — Progress Notes (Signed)
PROGRESS NOTE  Kathleen Gibson HCW:[id number] DOB: May 21, 1951   PCP: [name], DO  Patient is from: Home  DOA: 10/28/2020 LOS: 9  Chief complaints: Low platelet  Brief Narrative / Interim history: 70 year old F with PMH of DM-2 with neuropathy, ITP, gastric bypass, HTN, NAFLD and noncompliance presented to ED with severe thrombocytopenia, and admitted for acute on chronic ITP.  Platelet was 5K.  Treated with platelet transfusion (4 apheresis), IVIG, Nplate and oral prednisone with some initial improvement.   Patient had bone marrow and right axillary lymph node biopsy performed on 5/20.  Pathology on bone marrow biopsy was not conclusive.  Pathology from right axillary lymph node nondiagnostic.  General surgery consulted for excisional lymph node biopsy.   Platelet is downtrending despite IVIG, Nplate, oral prednisone and multiple platelet transfusions.  Heme-onc following.  Subjective: Seen and examined earlier this morning.  No major events overnight or this morning.  Platelet dropped further to 6000 despite platelet transfusions.  She had one apheresis earlier this morning but platelets remained at 6000.  She was also hypoglycemic to 51 earlier this morning.  Hypoglycemia improved with orange juice..  Otherwise, no complaints.  She denies chest pain, shortness of breath, signs and symptoms of bleeding other than for bruising in her arms and right axilla.  Objective: Vitals:   11/07/20 0400 11/07/20 0435 11/07/20 0641 11/07/20 0923  BP: 110/71 114/63 (!) 107/59 120/60  Pulse: 81 78 81 88  Resp: _0 Temp: 98.1 F (36.7 C) 98.2 F (36.8 C) 98.4 F (36.9 C) 99 F (37.2 C)  TempSrc: Oral Oral Oral Oral  SpO2: 99% 99% 98% 98%  Weight:      Height:        Intake/Output Summary (Last 24 hours) at 11/07/2020 1346 Last data filed at 11/07/2020 1100 Gross per 24 hour  Intake 1612 ml  Output 0 ml  Net 1612 ml   Filed Weights   10/29/20 0125 10/31/20 2044 11/02/20 2111   Weight: 75.7 kg 77.3 kg 77.3 kg    Examination:  GENERAL: No apparent distress.  Nontoxic. HEENT: MMM.  Vision and hearing grossly intact.  NECK: Supple.  No apparent JVD.  RESP: On RA.  No IWOB.  Fair aeration bilaterally. CVS:  RRR. Heart sounds normal.  ABD/GI/GU: BS+. Abd soft, NTND.  MSK/EXT:  Moves extremities. No apparent deformity. No edema.  SKIN: Few scattered bruising in antecubital areas and right axilla but no hematoma NEURO: Awake and alert. Oriented appropriately.  No apparent focal neuro deficit. PSYCH: Calm. Normal affect.   Procedures:  5/20-bone marrow and right axillary lymph node biopsy by IR  Microbiology summarized: COVID-19 and influenza PCR nonreactive.  Assessment & Plan: Acute on chronic thrombocytopenia: Baseline platelet ranges from 40s to 90s.  Thrombocytopenia worsened despite IVIG, Nplate, systemic steroid and multiple platelet transfusions Recent Labs  Lab 11/01/20 0949 11/02/20 0423 11/03/20 1157 11/04/20 0412 11/05/20 0340 11/06/20 0359 11/07/20 0248 11/07/20 0957  PLT 11* 15* 16* 15* 11* 8* 6* 6*  -Transfused 2 more units/apheresis -Continue weekly Nplate per heme-onc -P.o. prednisone did not seem to help. Hematology tapering off.  Currently on 20 mg daily.  -Transfuse for platelets<10K or bleeding -On-call heme-onc to see patient this afternoon  Diffuse lymphadenopathy/leukocytosis: s/p bone marrow and right axillary biopsy results as above. -General surgery consulted for possible excisional lymph node biopsy but no palpable lymph node -Heme-onc following. -General surgery to reevaluate on 5/30  Acute blood loss anemia/Normocytic anemia:  H&H stable after 1 unit. Recent Labs    10/30/20 0545 10/31/20 0217 11/01/20 0949 11/02/20 0423 11/03/20 1157 11/04/20 0412 11/05/20 0340 11/06/20 0359 11/07/20 0248 11/07/20 0957  HGB 7.6* 6.5* 7.9* 7.8* 8.4* 8.6* 8.6* 8.6* 8.7* 9.3*  -Monitor intermittently  Controlled NIDDM-2  with polyneuropathy, hypoglycemia and hyperglycemia: A1c 5.9%.  CBG downtrending as she comes off steroid. Recent Labs  Lab 11/06/20 1631 11/06/20 2046 11/07/20 0655 11/07/20 0732 11/07/20 1133  GLUCAP 345* 204* 51* 80 89  -Continue SSI-moderate -Decrease NovoLog from 6 to 2 units 3 times daily with meals -Continue with Lantus from 30 to 20 units in the morning -Discontinue nightly Lantus -Further adjustment as appropriate -Continue home gabapentin  Essential hypertension: Normotensive -Continue home metoprolol for now but she could benefit from losartan given diabetes.   History of noncompliance with medical treatment -She has been counseled.  She has been counseled  Leukocytosis/bandemia-likely demargination from steroid  Class I obesity Body mass index is 30.19 kg/m.         DVT prophylaxis:  SCDs Start: 10/28/20 2337  Code Status: DNR/DNI Family Communication: Updated patient's niece at bedside on 5/27. Level of care: Med-Surg Status is: Inpatient  Remains inpatient appropriate because:Ongoing diagnostic testing needed not appropriate for outpatient work up, IV treatments appropriate due to intensity of illness or inability to take PO and Inpatient level of care appropriate due to severity of illness   Dispo: The patient is from: Home              Anticipated d/c is to: Home              Patient currently is not medically stable to d/c.   Difficult to place patient No       Consultants:  Heme-onc General surgery   Sch Meds:  Scheduled Meds: . sodium chloride   Intravenous Once  . acetaminophen  650 mg Oral Once  . folic acid  1 mg Oral Daily  . gabapentin  800 mg Oral TID  . insulin aspart  0-15 Units Subcutaneous TID WC  . insulin aspart  0-5 Units Subcutaneous QHS  . insulin aspart  4 Units Subcutaneous TID WC  . insulin glargine  20 Units Subcutaneous Daily  . metoprolol succinate  50 mg Oral Daily  . mirtazapine  15 mg Oral QHS  .  potassium chloride  20 mEq Oral BID  . predniSONE  20 mg Oral Q breakfast   Continuous Infusions: PRN Meds:.acetaminophen **OR** acetaminophen, melatonin, ondansetron **OR** ondansetron (ZOFRAN) IV, polyethylene glycol  Antimicrobials: Anti-infectives (From admission, onward)   None       I have personally reviewed the following labs and images: CBC: Recent Labs  Lab 11/01/20 0949 11/02/20 0423 11/04/20 0412 11/05/20 0340 11/06/20 0359 11/07/20 0248 11/07/20 0957  WBC 10.7*   < > 13.8* 16.4* 18.0* 20.3* 21.7*  NEUTROABS 5.8  --   --   --   --   --   --   HGB 7.9*   < > 8.6* 8.6* 8.6* 8.7* 9.3*  HCT 25.8*   < > 29.1* 28.8* 28.0* 28.8* 29.8*  MCV 92.5   < > 93.6 93.5 92.7 92.0 90.9  PLT 11*   < > 15* 11* 8* 6* 6*   < > = values in this interval not displayed.   BMP &GFR Recent Labs  Lab 11/05/20 0340 11/06/20 0359  NA 138 136  K 4.1 4.1  CL 106 108  CO2 28 24  GLUCOSE 66* 130*  BUN 19 19  CREATININE 0.82 0.72  CALCIUM 8.6* 8.8*  MG 1.9 1.7  PHOS 3.1 3.7   Estimated Creatinine Clearance: 65.4 mL/min (by C-G formula based on SCr of 0.72 mg/dL). Liver & Pancreas: Recent Labs  Lab 11/05/20 0340 11/06/20 0359  ALBUMIN 2.8* 2.7*   No results for input(s): LIPASE, AMYLASE in the last 168 hours. No results for input(s): AMMONIA in the last 168 hours. Diabetic: No results for input(s): HGBA1C in the last 72 hours. Recent Labs  Lab 11/06/20 1631 11/06/20 2046 11/07/20 0655 11/07/20 0732 11/07/20 1133  GLUCAP 345* 204* 51* 80 89   Cardiac Enzymes: No results for input(s): CKTOTAL, CKMB, CKMBINDEX, TROPONINI in the last 168 hours. No results for input(s): PROBNP in the last 8760 hours. Coagulation Profile: No results for input(s): INR, PROTIME in the last 168 hours. Thyroid Function Tests: No results for input(s): TSH, T4TOTAL, FREET4, T3FREE, THYROIDAB in the last 72 hours. Lipid Profile: No results for input(s): CHOL, HDL, LDLCALC, TRIG, CHOLHDL,  LDLDIRECT in the last 72 hours. Anemia Panel: No results for input(s): VITAMINB12, FOLATE, FERRITIN, TIBC, IRON, RETICCTPCT in the last 72 hours. Urine analysis:    Component Value Date/Time   COLORURINE YELLOW 09/05/2019 2240   APPEARANCEUR CLEAR 09/05/2019 2240   LABSPEC 1.010 09/05/2019 2240   PHURINE 5.5 09/05/2019 2240   GLUCOSEU NEGATIVE 09/05/2019 2240   HGBUR MODERATE (A) 09/05/2019 2240   BILIRUBINUR NEGATIVE 09/05/2019 2240   KETONESUR NEGATIVE 09/05/2019 2240   PROTEINUR 30 (A) 09/05/2019 2240   NITRITE NEGATIVE 09/05/2019 2240   LEUKOCYTESUR NEGATIVE 09/05/2019 2240   Sepsis Labs: Invalid input(s): PROCALCITONIN, [place]  Microbiology: Recent Results (from the past 240 hour(s))  Resp Panel by RT-PCR (Flu A&B, Covid) Nasopharyngeal Swab     Status: None   Collection Time: 10/28/20  6:07 PM   Specimen: Nasopharyngeal Swab; Nasopharyngeal(NP) swabs in vial transport medium  Result Value Ref Range Status   SARS Coronavirus 2 by RT PCR NEGATIVE NEGATIVE Final    Comment: (NOTE) SARS-CoV-2 target nucleic acids are NOT DETECTED.  The SARS-CoV-2 RNA is generally detectable in upper respiratory specimens during the acute phase of infection. The lowest concentration of SARS-CoV-2 viral copies this assay can detect is 138 copies/mL. A negative result does not preclude SARS-Cov-2 infection and should not be used as the sole basis for treatment or other patient management decisions. A negative result may occur with  improper specimen collection/handling, submission of specimen other than nasopharyngeal swab, presence of viral mutation(s) within the areas targeted by this assay, and inadequate number of viral copies(<138 copies/mL). A negative result must be combined with clinical observations, patient history, and epidemiological information. The expected result is Negative.  Fact Sheet for Patients:  EntrepreneurPulse.com.au  Fact Sheet for  Healthcare Providers:  IncredibleEmployment.be  This test is no t yet approved or cleared by the [place] FDA and  has been authorized for detection and/or diagnosis of SARS-CoV-2 by FDA under an Emergency Use Authorization (EUA). This EUA will remain  in effect (meaning this test can be used) for the duration of the COVID-19 declaration under Section 564(b)(1) of the Act, 21 U.S.C.section 360bbb-3(b)(1), unless the authorization is terminated  or revoked sooner.       Influenza A by PCR NEGATIVE NEGATIVE Final   Influenza B by PCR NEGATIVE NEGATIVE Final    Comment: (NOTE) The Xpert Xpress SARS-CoV-2/FLU/RSV plus assay is intended as an aid in the diagnosis of influenza from Nasopharyngeal  swab specimens and should not be used as a sole basis for treatment. Nasal washings and aspirates are unacceptable for Xpert Xpress SARS-CoV-2/FLU/RSV testing.  Fact Sheet for Patients: EntrepreneurPulse.com.au  Fact Sheet for Healthcare Providers: IncredibleEmployment.be  This test is not yet approved or cleared by the [place] FDA and has been authorized for detection and/or diagnosis of SARS-CoV-2 by FDA under an Emergency Use Authorization (EUA). This EUA will remain in effect (meaning this test can be used) for the duration of the COVID-19 declaration under Section 564(b)(1) of the Act, 21 U.S.C. section 360bbb-3(b)(1), unless the authorization is terminated or revoked.  Performed at [facility], [place] [address], [place], [place] [address]     Radiology Studies: No results found.    Taye T. [place]  If 7PM-7AM, please contact night-coverage [email or web address] 11/07/2020, 1:46 PM

## 2020-11-07 NOTE — Progress Notes (Signed)
Hypoglycemic Event  CBG: 52  Treatment: 8 oz juice/soda  Symptoms: Shaky  Follow-up CBG: Time:00:17 CBG Result:211  Possible Reasons for Event: Medication regimen: Lantus  Comments/MD notified:Per hypoglycemia protocol    Kathleen Gibson [name]

## 2020-11-07 NOTE — Plan of Care (Signed)
  Problem: Clinical Measurements: Goal: Diagnostic test results will improve Outcome: Progressing   Problem: Clinical Measurements: Goal: Cardiovascular complication will be avoided Outcome: Progressing

## 2020-11-08 DIAGNOSIS — Z9119 Patient's noncompliance with other medical treatment and regimen: Secondary | ICD-10-CM | POA: Diagnosis not present

## 2020-11-08 DIAGNOSIS — R591 Generalized enlarged lymph nodes: Secondary | ICD-10-CM | POA: Diagnosis not present

## 2020-11-08 DIAGNOSIS — D693 Immune thrombocytopenic purpura: Secondary | ICD-10-CM | POA: Diagnosis not present

## 2020-11-08 DIAGNOSIS — I10 Essential (primary) hypertension: Secondary | ICD-10-CM | POA: Diagnosis not present

## 2020-11-08 LAB — CBC
HCT: 28.8 % — ABNORMAL LOW (ref 36.0–46.0)
HCT: 29.3 % — ABNORMAL LOW (ref 36.0–46.0)
Hemoglobin: 8.7 g/dL — ABNORMAL LOW (ref 12.0–15.0)
Hemoglobin: 8.9 g/dL — ABNORMAL LOW (ref 12.0–15.0)
MCH: 28 pg (ref 26.0–34.0)
MCH: 28.1 pg (ref 26.0–34.0)
MCHC: 30.2 g/dL (ref 30.0–36.0)
MCHC: 30.4 g/dL (ref 30.0–36.0)
MCV: 92.6 fL (ref 80.0–100.0)
MCV: 92.4 fL (ref 80.0–100.0)
Platelets: 5 10*3/uL — CL (ref 150–400)
Platelets: 9 K/uL — CL (ref 150–400)
RBC: 3.11 MIL/uL — ABNORMAL LOW (ref 3.87–5.11)
RBC: 3.17 MIL/uL — ABNORMAL LOW (ref 3.87–5.11)
RDW: 18.8 % — ABNORMAL HIGH (ref 11.5–15.5)
RDW: 18.7 % — ABNORMAL HIGH (ref 11.5–15.5)
WBC: 21.6 10*3/uL — ABNORMAL HIGH (ref 4.0–10.5)
WBC: 19.8 K/uL — ABNORMAL HIGH (ref 4.0–10.5)
nRBC: 0.3 % — ABNORMAL HIGH (ref 0.0–0.2)
nRBC: 0.5 % — ABNORMAL HIGH (ref 0.0–0.2)

## 2020-11-08 LAB — BPAM PLATELET PHERESIS
Blood Product Expiration Date: 202205302359
Blood Product Expiration Date: 202205302359
Blood Product Expiration Date: 202205302359
ISSUE DATE / TIME: 202205280414
ISSUE DATE / TIME: 202205281427
ISSUE DATE / TIME: 202205281912
Unit Type and Rh: 5100
Unit Type and Rh: 5100
Unit Type and Rh: 6200

## 2020-11-08 LAB — PREPARE PLATELET PHERESIS
Unit division: 0
Unit division: 0
Unit division: 0

## 2020-11-08 LAB — GLUCOSE, CAPILLARY
Glucose-Capillary: 177 mg/dL — ABNORMAL HIGH (ref 70–99)
Glucose-Capillary: 211 mg/dL — ABNORMAL HIGH (ref 70–99)
Glucose-Capillary: 284 mg/dL — ABNORMAL HIGH (ref 70–99)
Glucose-Capillary: 340 mg/dL — ABNORMAL HIGH (ref 70–99)
Glucose-Capillary: 86 mg/dL (ref 70–99)

## 2020-11-08 MED ORDER — SODIUM CHLORIDE 0.9% IV SOLUTION: Freq: Once | INTRAVENOUS | Status: DC

## 2020-11-08 MED ORDER — INSULIN GLARGINE 100 UNIT/ML ~~LOC~~ SOLN
10.0000 [IU] | Freq: Every day | SUBCUTANEOUS | Status: DC
  Administered 2020-11-08 – 2020-11-10 (×3): 10 [IU] via SUBCUTANEOUS
  Filled 2020-11-08 (×4): qty 0.1

## 2020-11-08 NOTE — Plan of Care (Signed)
  Problem: Activity: Goal: Risk for activity intolerance will decrease Outcome: Progressing   

## 2020-11-08 NOTE — Progress Notes (Signed)
HEMATOLOGY-ONCOLOGY PROGRESS NOTE  SUBJECTIVE: This morning Kathleen Gibson is accompanied by her daughter. She reports she feels well this AM. Denies any bleeding or dark stools. Energy level is good. She reports no fevers, chills, sweats, nausea, or vomiting. A full 10 point ROS is listed below.   REVIEW OF SYSTEMS:   10 Point review of Systems was done is negative except as noted above.  I have reviewed the past medical history, past surgical history, social history and family history with the patient and they are unchanged from previous note.   PHYSICAL EXAMINATION: ECOG PERFORMANCE STATUS: 2 - Symptomatic, <50% confined to bed  Vitals:   11/08/20 0448 11/08/20 0930  BP: 107/60 115/62  Pulse: 65 85  Resp: 16 18  Temp: 98.3 F (36.8 C) 99 F (37.2 C)  SpO2: 98% 99%   Filed Weights   10/29/20 0125 10/31/20 2044 11/02/20 2111  Weight: 166 lb 14.2 oz (75.7 kg) 170 lb 6.7 oz (77.3 kg) 170 lb 6.7 oz (77.3 kg)    Intake/Output from previous day: 05/28 0701 - 05/29 0700 In: 1780 [P.O.:1200; Blood:580] Out: 0    GENERAL:alert, in no acute distress and comfortable SKIN: Hematoma right axilla, no rashes EYES: conjunctiva are pink and non-injected, sclera anicteric OROPHARYNX: MMM, no exudates, no oropharyngeal erythema or ulceration NECK: supple, no JVD LUNGS: clear to auscultation b/l with normal respiratory effort HEART: regular rate & rhythm Extremity: no pedal edema PSYCH: alert & oriented x 3 with fluent speech NEURO: no focal motor/sensory deficits   LABORATORY DATA:  I have reviewed the data as listed CMP Latest Ref Rng & Units 11/06/2020 11/05/2020 10/29/2020  Glucose 70 - 99 mg/dL 130(H) 66(L) 116(H)  BUN 8 - 23 mg/dL _0 Creatinine 0.44 - 1.00 mg/dL 0.72 0.82 0.77  Sodium 135 - 145 mmol/L 136 138 139  Potassium 3.5 - 5.1 mmol/L 4.1 4.1 3.4(L)  Chloride 98 - 111 mmol/L 108 106 108  CO2 22 - 32 mmol/L _1 Calcium 8.9 - 10.3 mg/dL 8.8(L) 8.6(L) 8.0(L)   Total Protein 6.5 - 8.1 g/dL - - 7.8  Total Bilirubin 0.3 - 1.2 mg/dL - - 0.9  Alkaline Phos 38 - 126 U/L - - 40  AST 15 - 41 U/L - - 16  ALT 0 - 44 U/L - - 9    Lab Results  Component Value Date   WBC 19.8 (H) 11/08/2020   HGB 8.9 (L) 11/08/2020   HCT 29.3 (L) 11/08/2020   MCV 92.4 11/08/2020   PLT 9 (LL) 11/08/2020   NEUTROABS 5.8 11/01/2020    CT ABDOMEN PELVIS WO CONTRAST  Result Date: 10/29/2020 CLINICAL DATA:  Hematologic malignancy, staging. EXAM: CT CHEST, ABDOMEN AND PELVIS WITHOUT CONTRAST TECHNIQUE: Multidetector CT imaging of the chest, abdomen and pelvis was performed following the standard protocol without IV contrast. COMPARISON:  History of MRI diarrhea neck no palpable status chest left at [facility] 7 right-sided than she thickened change ir indications sick concern for insert at the right 6 arm. FINDINGS: CT CHEST FINDINGS Cardiovascular: The aortic and branch vessel atherosclerosis without aneurysmal dilation. Normal size heart. No significant pericardial effusion/thickening. Mediastinum/Nodes: Enlarged supraclavicular, bilateral axillary and left mammary lymph nodes. For reference: Left internal mammary lymph node measures 1.9 by 1.2 cm on image 52/7. Right axillary lymph node measures 3.1 x 2.3 cm on image 36/7. Prominent mediastinal lymph nodes measuring up to 9 mm in short axis on image 39/7, without adenopathy by size criteria.  Thyroid is grossly unremarkable. Trachea esophagus are grossly unremarkable. Lungs/Pleura: Right lower lobe calcified granuloma. Bibasilar atelectasis. Scattered bilateral micro nodules measuring 1-2 mm for instance on image 96/4 in the left lower lobe and image 72/4 in the right upper lobe. Musculoskeletal: Peripherally calcified nodule in the right axilla on image 22/3. No acute osseous abnormality. CT ABDOMEN PELVIS FINDINGS Hepatobiliary: Grossly unremarkable noncontrast appearance of the hepatic parenchyma. No hepatomegaly. Gallbladder is  distended with some layering hyperdense material representing sludge and tiny stones seen on prior ultrasound. No biliary ductal dilation. Pancreas: Within normal limits. Spleen: Splenomegaly measuring 22 cm in maximum craniocaudal dimension. Adrenals/Urinary Tract: Adrenal glands are unremarkable. Kidneys are normal, without renal calculi, contour deforming lesion, or hydronephrosis. Bladder is unremarkable. Stomach/Bowel: Prior gastric bypass surgery. Enteric contrast visualized to the level of the distal small bowel. No pathologic dilation of small bowel. Appendix is grossly unremarkable. Colonic diverticulosis without findings of acute diverticulitis. Vascular/Lymphatic: Aortic atherosclerosis without aneurysmal dilation. No significant interval change in the enlarged and prominent upper abdominal, retroperitoneal and mesenteric lymph nodes. Interval increase in size of the few enlarged and prominent pelvic lymph nodes. Reference lymph nodes are as follows: Periportal lymph node measuring 3.3 x 1.7 cm on image 54/3, unchanged. Aortocaval lymph node measures 1.6 x 1.5 cm on image 69/3, unchanged. Right pelvic sidewall lymph node measures 3.1 x 1.6 cm on image 109/3. Reproductive: Uterus and bilateral adnexa are unremarkable. Other: Small volume abdominopelvic ascites with diffuse mesenteric stranding. Multiple peritoneal calcifications are again visualized and unchanged from prior, likely sequela prior inflammation. Similar appearance of the nonspecific anterior abdominal wall subcutaneous calcifications. Mild diffuse subcutaneous edema. Musculoskeletal: Multilevel degenerative changes spine. No acute osseous abnormality. IMPRESSION: 1. Splenomegaly with pathologically enlarged lymph nodes above and below the diaphragm, with overall stable to minimally increased abdominal adenopathy and interval progression of the pelvic adenopathy. 2. Small volume abdominopelvic ascites with diffuse mesenteric stranding. 3.  Scattered bilateral pulmonary micro nodules measuring 1-2 mm. 4. Distended gallbladder with some layering hyperdense material representing layering sludge and tiny stones seen on prior ultrasound. 5. Aortic atherosclerosis. Aortic Atherosclerosis (ICD10-I70.0). Electronically Signed   By: Dahlia Bailiff MD   On: 10/29/2020 19:53   CT CHEST WO CONTRAST  Result Date: 10/29/2020 CLINICAL DATA:  Hematologic malignancy, staging. EXAM: CT CHEST, ABDOMEN AND PELVIS WITHOUT CONTRAST TECHNIQUE: Multidetector CT imaging of the chest, abdomen and pelvis was performed following the standard protocol without IV contrast. COMPARISON:  History of MRI diarrhea neck no palpable status chest left at [facility] 7 right-sided than she thickened change ir indications sick concern for insert at the right 6 arm. FINDINGS: CT CHEST FINDINGS Cardiovascular: The aortic and branch vessel atherosclerosis without aneurysmal dilation. Normal size heart. No significant pericardial effusion/thickening. Mediastinum/Nodes: Enlarged supraclavicular, bilateral axillary and left mammary lymph nodes. For reference: Left internal mammary lymph node measures 1.9 by 1.2 cm on image 52/7. Right axillary lymph node measures 3.1 x 2.3 cm on image 36/7. Prominent mediastinal lymph nodes measuring up to 9 mm in short axis on image 39/7, without adenopathy by size criteria. Thyroid is grossly unremarkable. Trachea esophagus are grossly unremarkable. Lungs/Pleura: Right lower lobe calcified granuloma. Bibasilar atelectasis. Scattered bilateral micro nodules measuring 1-2 mm for instance on image 96/4 in the left lower lobe and image 72/4 in the right upper lobe. Musculoskeletal: Peripherally calcified nodule in the right axilla on image 22/3. No acute osseous abnormality. CT ABDOMEN PELVIS FINDINGS Hepatobiliary: Grossly unremarkable noncontrast appearance of the hepatic parenchyma. No hepatomegaly.  Gallbladder is distended with some layering hyperdense material  representing sludge and tiny stones seen on prior ultrasound. No biliary ductal dilation. Pancreas: Within normal limits. Spleen: Splenomegaly measuring 22 cm in maximum craniocaudal dimension. Adrenals/Urinary Tract: Adrenal glands are unremarkable. Kidneys are normal, without renal calculi, contour deforming lesion, or hydronephrosis. Bladder is unremarkable. Stomach/Bowel: Prior gastric bypass surgery. Enteric contrast visualized to the level of the distal small bowel. No pathologic dilation of small bowel. Appendix is grossly unremarkable. Colonic diverticulosis without findings of acute diverticulitis. Vascular/Lymphatic: Aortic atherosclerosis without aneurysmal dilation. No significant interval change in the enlarged and prominent upper abdominal, retroperitoneal and mesenteric lymph nodes. Interval increase in size of the few enlarged and prominent pelvic lymph nodes. Reference lymph nodes are as follows: Periportal lymph node measuring 3.3 x 1.7 cm on image 54/3, unchanged. Aortocaval lymph node measures 1.6 x 1.5 cm on image 69/3, unchanged. Right pelvic sidewall lymph node measures 3.1 x 1.6 cm on image 109/3. Reproductive: Uterus and bilateral adnexa are unremarkable. Other: Small volume abdominopelvic ascites with diffuse mesenteric stranding. Multiple peritoneal calcifications are again visualized and unchanged from prior, likely sequela prior inflammation. Similar appearance of the nonspecific anterior abdominal wall subcutaneous calcifications. Mild diffuse subcutaneous edema. Musculoskeletal: Multilevel degenerative changes spine. No acute osseous abnormality. IMPRESSION: 1. Splenomegaly with pathologically enlarged lymph nodes above and below the diaphragm, with overall stable to minimally increased abdominal adenopathy and interval progression of the pelvic adenopathy. 2. Small volume abdominopelvic ascites with diffuse mesenteric stranding. 3. Scattered bilateral pulmonary micro nodules measuring  1-2 mm. 4. Distended gallbladder with some layering hyperdense material representing layering sludge and tiny stones seen on prior ultrasound. 5. Aortic atherosclerosis. Aortic Atherosclerosis (ICD10-I70.0). Electronically Signed   By: Dahlia Bailiff MD   On: 10/29/2020 19:53   CT BIOPSY  Result Date: 10/30/2020 INDICATION: 70 year old female presents for ultrasound-guided right axillary node biopsy EXAM: ULTRASOUND-GUIDED RIGHT AXILLARY NODE BIOPSY MEDICATIONS: None. ANESTHESIA/SEDATION: Moderate (conscious) sedation was employed during this procedure, as the same moderate sedation that was performed during the accession number [id number]. Same sedation time for sequential procedures. A total of Versed 3.0 mg and Fentanyl 125 mcg was administered intravenously. Moderate Sedation Time: 42 minutes. The patient's level of consciousness and vital signs were monitored continuously by radiology nursing throughout the procedure under my direct supervision. FLUOROSCOPY TIME:  ULTRASOUND COMPLICATIONS: NONE PROCEDURE: Informed written consent was obtained from the patient after a thorough discussion of the procedural risks, benefits and alternatives. All questions were addressed. Maximal Sterile Barrier Technique was utilized including caps, mask, sterile gowns, sterile gloves, sterile drape, hand hygiene and skin antiseptic. A timeout was performed prior to the initiation of the procedure. After a bone marrow biopsy was performed under CT guidance, the patient was turned into the supine position on her stretcher. Ultrasound images of the right axillary region were performed with images stored. The right axillary region was prepped with chlorhexidine in a sterile fashion, and a sterile drape was applied covering the operative field. A sterile gown and sterile gloves were used for the procedure. Local anesthesia was provided with 1% Lidocaine. Ultrasound guidance was used to infiltrate the region with 1% lidocaine for  local anesthesia. Five separate 18 gauge core biopsy were then acquired of the right axillary node using ultrasound guidance. Specimens placed into fresh specimen/saline. Patient tolerated the procedure well and remained hemodynamically stable throughout. No complications were encountered and no significant blood loss was encounter IMPRESSION: Status post ultrasound-guided biopsy of right axillary lymph node. Signed,  Dulcy Fanny. Dellia Nims, RPVI [facility] Radiology Electronically Signed   By: Corrie Mckusick D.O.   On: 10/30/2020 11:05   CT BONE MARROW BIOPSY & ASPIRATION  Result Date: 10/30/2020 INDICATION: 70 year old female referred for bone marrow biopsy EXAM: CT BONE MARROW BIOPSY AND ASPIRATION MEDICATIONS: None. ANESTHESIA/SEDATION: Moderate (conscious) sedation was employed during this procedure. A total of Versed 3.0 mg and Fentanyl 125 mcg was administered intravenously. Moderate Sedation Time: 42 minutes. The patient's level of consciousness and vital signs were monitored continuously by radiology nursing throughout the procedure under my direct supervision. FLUOROSCOPY TIME:  CT COMPLICATIONS: None PROCEDURE: The procedure risks, benefits, and alternatives were explained to the patient. Questions regarding the procedure were encouraged and answered. The patient understands and consents to the procedure. Scout CT of the pelvis was performed for surgical planning purposes. The posterior pelvis was prepped with Chlorhexidine in a sterile fashion, and a sterile drape was applied covering the operative field. A sterile gown and sterile gloves were used for the procedure. Local anesthesia was provided with 1% Lidocaine. Posterior right iliac bone was targeted for biopsy. The skin and subcutaneous tissues were infiltrated with 1% lidocaine without epinephrine. A small stab incision was made with an 11 blade scalpel, and an 11 gauge Murphy needle was advanced  with CT guidance to the posterior cortex. Manual forced was used to advance the needle through the posterior cortex and the stylet was removed. A bone marrow aspirate was retrieved and passed to a cytotechnologist in the room. The Murphy needle was then advanced without the stylet for a core biopsy. The core biopsy was retrieved and also passed to a cytotechnologist. Manual pressure was used for hemostasis and a sterile dressing was placed. No complications were encountered no significant blood loss was encountered. Patient tolerated the procedure well and remained hemodynamically stable throughout. After the bone marrow biopsy was completed, the patient was then positioned supine on her stretcher and we proceeded with the next staff of the planned case, a right axillary lymph node ultrasound-guided biopsy. The same sedation time is applied for both procedures. IMPRESSION: Status post CT-guided bone marrow biopsy, with tissue specimen sent to pathology for complete histopathologic analysis Signed, Dulcy Fanny. Earleen Newport, DO [facility] Radiology Electronically Signed   By: Corrie Mckusick D.O.   On: 10/30/2020 11:02   US Abdomen Limited RUQ (LIVER/GB)  Result Date: 10/28/2020 CLINICAL DATA:  Follow-up cirrhosis EXAM: ULTRASOUND ABDOMEN LIMITED RIGHT UPPER QUADRANT COMPARISON:  09/05/2019 FINDINGS: Gallbladder: Gallbladder is well distended with gallbladder sludge within. Previously seen calculi are noted within the dependent sludge. No wall thickening or pericholecystic fluid is noted. Negative sonographic Murphy's sign is elicited. Common bile duct: Diameter: 2.3 mm. Liver: No focal lesion identified. Within normal limits in parenchymal echogenicity. Portal vein is patent on color Doppler imaging with normal direction of blood flow towards the liver. Other: None. IMPRESSION: Gallbladder sludge and cholelithiasis. No complicating factors are noted. Electronically Signed    By: Inez Catalina M.D.   On: 10/28/2020 23:34    ASSESSMENT AND PLAN:  Kathleen Gibson is a 70 year old female with history of ITP and lymphadenopathy who presented to the hospital with severe thrombocytopenia and anemia.  1. Severe thrombocytopenia 2. Normocytic anemia 3. Lymphadenopathy and splenomegaly concerning for neoplastic process - ? Low grade NHL vs other etiology like sarcoidosis. 4. Type 2 diabetes mellitus with polyneuropathy 5.  Hypertension  PLAN -Status post IVIG and prednisone.  She  was on 60 mg daily without significant improvement and has now been weaned down to 40 mg daily. -Platelet count 6000 today, not improving.  -Received Nplate 3 mcg/kg 0/27.  Plan to continue this weekly. -continue to transfuse PRBC for hgb<7.5 or if symptomatic -transfuse platelets for PLT count <10k or if bleeding -Discussed with the patient and her family that her low platelets are likely related to ITP. -Bone marrow biopsy results shows increased blood cells with some abnormalities.  There was no evidence of acute leukemia or high-grade lymphoma.  However definitive diagnosis not able to be made at this time based on bone marrow biopsy.  Differentials include MPN/MDS versus [name].  We will marrow biopsy has been sent for cytogenetics and FISH panel.  This may take up to 2 weeks to result.  We will plan to discuss the final results once they are available to [place]. -Lymph node biopsy nondiagnostic.  General surgery consulted and will consider excisional lymph node biopsy if improvement in her thrombocytopenia. -Prednisone is currently being tapered due to not having significant response.  Currently on 40 mg daily. I would continue at this dose for now.  -Okay to discharge to home if platelet count rises above 15,000 and remains stable.  We will plan for close outpatient follow-up in our clinic.  Ledell Peoples, MD Department of Hematology/Oncology [place] [facility] Phone: [phone] Pager: [phone] Email: [email or web address]

## 2020-11-08 NOTE — Progress Notes (Signed)
PROGRESS NOTE  Kathleen Gibson RDE:[id number] DOB: 1950/09/26   PCP: [name], DO  Patient is from: Home  DOA: 10/28/2020 LOS: 26  Chief complaints: Low platelet  Brief Narrative / Interim history: 70 year old F with PMH of DM-2 with neuropathy, ITP, gastric bypass, HTN, NAFLD and noncompliance presented to ED with severe thrombocytopenia, and admitted for acute on chronic ITP.  Platelet was 5K.  Treated with platelet transfusion (4 apheresis), IVIG, Nplate and oral prednisone with some initial improvement.   Patient had bone marrow and right axillary lymph node biopsy performed on 5/20. Differentials based on bone marrow biopsy include MPN/MDS versus [name]. Cytogenetics and FISH panel pending.  Unfortunately, right axillary biopsy was non diagnostic.  General surgery consulted for excisional lymph node biopsy but here persistent thrombocytopenia is a barrier.   Platelet is downtrended despite IVIG, Nplate, oral prednisone and multiple platelet transfusions.  Heme-onc following.  Subjective: Seen and examined earlier this morning.  No major events overnight of this morning.  Platelet down to 5000 about midnight despite platelet transfusions.  Her platelet is up to 9000 this morning which is promising.  Otherwise, no complaints.  Stable looking bruises over antecubital fossa's and in right axilla.  No hematoma.  She denies melena or hematochezia.  Objective: Vitals:   11/08/20 0448 11/08/20 0930 11/08/20 1356 11/08/20 1424  BP: 107/60 115/62 116/71 (!) 107/58  Pulse: 65 85 86 81  Resp: $Remo'16 18 18 19  'GXGEf$ Temp: 98.3 F (36.8 C) 99 F (37.2 C) 98.2 F (36.8 C) 97.6 F (36.4 C)  TempSrc: Oral Oral Oral Oral  SpO2: 98% 99% 98% 99%  Weight:      Height:        Intake/Output Summary (Last 24 hours) at 11/08/2020 1432 Last data filed at 11/08/2020 1300 Gross per 24 hour  Intake 1540 ml  Output 0 ml  Net 1540 ml   Filed Weights   10/29/20 0125 10/31/20 2044 11/02/20 2111  Weight: 75.7  kg 77.3 kg 77.3 kg    Examination:  GENERAL: No apparent distress.  Nontoxic. HEENT: MMM.  Vision and hearing grossly intact.  NECK: Supple.  No apparent JVD.  RESP: On RA.  No IWOB.  Fair aeration bilaterally. CVS:  RRR. Heart sounds normal.  ABD/GI/GU: BS+. Abd soft, NTND.  MSK/EXT:  Moves extremities. No apparent deformity. No edema.  SKIN: Small bruises/ecchymosis over antecubital fossa and right axillary area. NEURO: Awake and alert. Oriented appropriately.  No apparent focal neuro deficit. PSYCH: Calm. Normal affect.  Procedures:  5/20-bone marrow and right axillary lymph node biopsy by IR  Microbiology summarized: COVID-19 and influenza PCR nonreactive.  Assessment & Plan: Acute on chronic thrombocytopenia: Baseline platelet ranges from 40s to 90s.  Thrombocytopenia persists despite IVIG, Nplate, systemic steroid and multiple platelet transfusions Recent Labs  Lab 11/02/20 0423 11/03/20 1157 11/04/20 0412 11/05/20 0340 11/06/20 0359 11/07/20 0248 11/07/20 0957 11/07/20 2324 11/08/20 0710  PLT 15* 16* 15* 11* 8* 6* 6* 5* 9*  -Received 7 apheresis of platelets so far -Continue weekly Nplate per heme-onc -P.o. prednisone 40 mg daily per heme-onc. -Transfuse for platelets<10K or bleeding.  Ordered 1 apheresis this morning -Heme-onc following.  Diffuse lymphadenopathy/leukocytosis: s/p bone marrow and right axillary biopsy. Differentials based on bone marrow biopsy include MPN/MDS versus [name]. Cytogenetics and FISH panel pending.  Unfortunately, right axillary biopsy was non diagnostic.  -Surgery consulted for possible excisional lymph node biopsy but has to wait due to thrombocytopenia -Heme-onc following.  Acute blood  loss anemia/Normocytic anemia: H&H stable after 1 unit. Recent Labs    11/01/20 0949 11/02/20 0423 11/03/20 1157 11/04/20 0412 11/05/20 0340 11/06/20 0359 11/07/20 0248 11/07/20 0957 11/07/20 2324 11/08/20 0710  HGB 7.9* 7.8* 8.4* 8.6*  8.6* 8.6* 8.7* 9.3* 8.7* 8.9*  -Monitor intermittently  Controlled NIDDM-2 with polyneuropathy, hypoglycemia and hyperglycemia: A1c 5.9%.  CBG downtrending as she comes off steroid. Recent Labs  Lab 11/07/20 2050 11/07/20 2336 11/08/20 0017 11/08/20 0646 11/08/20 1223  GLUCAP 144* 52* 211* 86 177*  -Continue SSI-moderate -Discontinued mealtime NovoLog and nightly Lantus -Decreased a.m. Lantus from 20 to 10 units -Further adjustment as appropriate -Continue home gabapentin  Essential hypertension: Normotensive -Continue home metoprolol for now but she could benefit from losartan given diabetes.   History of noncompliance with medical treatment -She has been counseled.  She has been counseled  Leukocytosis/bandemia-likely demargination from steroid  Class I obesity Body mass index is 30.19 kg/m.         DVT prophylaxis:  SCDs Start: 10/28/20 2337  Code Status: DNR/DNI Family Communication: Patient and RN.  None at bedside. Level of care: Med-Surg Status is: Inpatient  Remains inpatient appropriate because:Ongoing diagnostic testing needed not appropriate for outpatient work up, IV treatments appropriate due to intensity of illness or inability to take PO and Inpatient level of care appropriate due to severity of illness   Dispo: The patient is from: Home              Anticipated d/c is to: Home              Patient currently is not medically stable to d/c.   Difficult to place patient No       Consultants:  Heme-onc General surgery   Sch Meds:  Scheduled Meds: . sodium chloride   Intravenous Once  . sodium chloride   Intravenous Once  . acetaminophen  650 mg Oral Once  . folic acid  1 mg Oral Daily  . gabapentin  800 mg Oral TID  . insulin aspart  0-15 Units Subcutaneous TID WC  . insulin aspart  0-5 Units Subcutaneous QHS  . insulin glargine  10 Units Subcutaneous Daily  . metoprolol succinate  50 mg Oral Daily  . mirtazapine  15 mg Oral QHS  .  potassium chloride  20 mEq Oral BID  . predniSONE  40 mg Oral Q breakfast   Continuous Infusions: PRN Meds:.acetaminophen **OR** acetaminophen, melatonin, ondansetron **OR** ondansetron (ZOFRAN) IV, polyethylene glycol  Antimicrobials: Anti-infectives (From admission, onward)   None       I have personally reviewed the following labs and images: CBC: Recent Labs  Lab 11/06/20 0359 11/07/20 0248 11/07/20 0957 11/07/20 2324 11/08/20 0710  WBC 18.0* 20.3* 21.7* 21.6* 19.8*  HGB 8.6* 8.7* 9.3* 8.7* 8.9*  HCT 28.0* 28.8* 29.8* 28.8* 29.3*  MCV 92.7 92.0 90.9 92.6 92.4  PLT 8* 6* 6* 5* 9*   BMP &GFR Recent Labs  Lab 11/05/20 0340 11/06/20 0359  NA 138 136  K 4.1 4.1  CL 106 108  CO2 28 24  GLUCOSE 66* 130*  BUN 19 19  CREATININE 0.82 0.72  CALCIUM 8.6* 8.8*  MG 1.9 1.7  PHOS 3.1 3.7   Estimated Creatinine Clearance: 65.4 mL/min (by C-G formula based on SCr of 0.72 mg/dL). Liver & Pancreas: Recent Labs  Lab 11/05/20 0340 11/06/20 0359  ALBUMIN 2.8* 2.7*   No results for input(s): LIPASE, AMYLASE in the last 168 hours. No results for input(s):  AMMONIA in the last 168 hours. Diabetic: No results for input(s): HGBA1C in the last 72 hours. Recent Labs  Lab 11/07/20 2050 11/07/20 2336 11/08/20 0017 11/08/20 0646 11/08/20 1223  GLUCAP 144* 52* 211* 86 177*   Cardiac Enzymes: No results for input(s): CKTOTAL, CKMB, CKMBINDEX, TROPONINI in the last 168 hours. No results for input(s): PROBNP in the last 8760 hours. Coagulation Profile: No results for input(s): INR, PROTIME in the last 168 hours. Thyroid Function Tests: No results for input(s): TSH, T4TOTAL, FREET4, T3FREE, THYROIDAB in the last 72 hours. Lipid Profile: No results for input(s): CHOL, HDL, LDLCALC, TRIG, CHOLHDL, LDLDIRECT in the last 72 hours. Anemia Panel: No results for input(s): VITAMINB12, FOLATE, FERRITIN, TIBC, IRON, RETICCTPCT in the last 72 hours. Urine analysis:    Component Value  Date/Time   COLORURINE YELLOW 09/05/2019 2240   APPEARANCEUR CLEAR 09/05/2019 2240   LABSPEC 1.010 09/05/2019 2240   PHURINE 5.5 09/05/2019 2240   GLUCOSEU NEGATIVE 09/05/2019 2240   HGBUR MODERATE (A) 09/05/2019 2240   BILIRUBINUR NEGATIVE 09/05/2019 2240   KETONESUR NEGATIVE 09/05/2019 2240   PROTEINUR 30 (A) 09/05/2019 2240   NITRITE NEGATIVE 09/05/2019 2240   LEUKOCYTESUR NEGATIVE 09/05/2019 2240   Sepsis Labs: Invalid input(s): PROCALCITONIN, [place]  Microbiology: No results found for this or any previous visit (from the past 240 hour(s)).  Radiology Studies: No results found.    Tanee Henery T. [place]  If 7PM-7AM, please contact night-coverage [email or web address] 11/08/2020, 2:32 PM

## 2020-11-09 DIAGNOSIS — D693 Immune thrombocytopenic purpura: Secondary | ICD-10-CM | POA: Diagnosis not present

## 2020-11-09 DIAGNOSIS — I10 Essential (primary) hypertension: Secondary | ICD-10-CM | POA: Diagnosis not present

## 2020-11-09 DIAGNOSIS — Z9119 Patient's noncompliance with other medical treatment and regimen: Secondary | ICD-10-CM | POA: Diagnosis not present

## 2020-11-09 DIAGNOSIS — R591 Generalized enlarged lymph nodes: Secondary | ICD-10-CM | POA: Diagnosis not present

## 2020-11-09 LAB — CBC
HCT: 28.7 % — ABNORMAL LOW (ref 36.0–46.0)
Hemoglobin: 8.7 g/dL — ABNORMAL LOW (ref 12.0–15.0)
MCH: 28.4 pg (ref 26.0–34.0)
MCHC: 30.3 g/dL (ref 30.0–36.0)
MCV: 93.8 fL (ref 80.0–100.0)
Platelets: 5 10*3/uL — CL (ref 150–400)
RBC: 3.06 MIL/uL — ABNORMAL LOW (ref 3.87–5.11)
RDW: 18.7 % — ABNORMAL HIGH (ref 11.5–15.5)
WBC: 21.5 10*3/uL — ABNORMAL HIGH (ref 4.0–10.5)
nRBC: 0.3 % — ABNORMAL HIGH (ref 0.0–0.2)

## 2020-11-09 LAB — PREPARE PLATELET PHERESIS: Unit division: 0

## 2020-11-09 LAB — BPAM PLATELET PHERESIS
Blood Product Expiration Date: 202206012359
ISSUE DATE / TIME: 202205291403
Unit Type and Rh: 5100

## 2020-11-09 LAB — GLUCOSE, CAPILLARY
Glucose-Capillary: 100 mg/dL — ABNORMAL HIGH (ref 70–99)
Glucose-Capillary: 152 mg/dL — ABNORMAL HIGH (ref 70–99)
Glucose-Capillary: 246 mg/dL — ABNORMAL HIGH (ref 70–99)

## 2020-11-09 MED ORDER — INSULIN ASPART 100 UNIT/ML IJ SOLN
5.0000 [IU] | Freq: Three times a day (TID) | INTRAMUSCULAR | Status: DC
  Administered 2020-11-09 – 2020-11-10 (×4): 5 [IU] via SUBCUTANEOUS

## 2020-11-09 MED ORDER — SODIUM CHLORIDE 0.9% IV SOLUTION: Freq: Once | INTRAVENOUS | Status: AC

## 2020-11-09 MED ORDER — INSULIN ASPART 100 UNIT/ML IJ SOLN
3.0000 [IU] | Freq: Three times a day (TID) | INTRAMUSCULAR | Status: DC
  Administered 2020-11-09: 3 [IU] via SUBCUTANEOUS

## 2020-11-09 MED ORDER — INSULIN ASPART 100 UNIT/ML IJ SOLN
0.0000 [IU] | Freq: Three times a day (TID) | INTRAMUSCULAR | Status: DC
  Administered 2020-11-09: 3 [IU] via SUBCUTANEOUS
  Administered 2020-11-09 – 2020-11-10 (×3): 5 [IU] via SUBCUTANEOUS

## 2020-11-09 NOTE — Progress Notes (Signed)
Platelet count remains very low at 6K.  No plans for excisional LN biopsy at this time in a non-urgent setting and given no obviously LNs palpable on Friday by MD.  Risk of complications still very present given above.  Ultimate plan initially was to get her platelets better and have her follow up in our office to discuss elective biopsy.  This is still reasonable, especially if her plts do not improve over the next several days.  Henreitta Cea 9:36 AM 11/09/2020

## 2020-11-09 NOTE — Progress Notes (Signed)
PROGRESS NOTE  Kathleen Gibson LZZ:[id number] DOB: 1950/09/19   PCP: Natalia Leatherwood, DO  Patient is from: Home  DOA: 10/28/2020 LOS: 11  Chief complaints: Low platelet  Brief Narrative / Interim history: 70 year old F with PMH of DM-2 with neuropathy, ITP, gastric bypass, HTN, NAFLD and noncompliance presented to ED with severe thrombocytopenia, and admitted for acute on chronic ITP.  Platelet was 5K.  Treated with platelet transfusion (4 apheresis), IVIG, Nplate and oral prednisone with some initial improvement.   Patient had bone marrow and right axillary lymph node biopsy performed on 5/20. Differentials based on bone marrow biopsy include MPN/MDS versus [name]. Cytogenetics and FISH panel pending.  Unfortunately, right axillary biopsy was non diagnostic.  General surgery consulted for excisional lymph node biopsy but here persistent thrombocytopenia is a barrier.   Platelet is downtrended despite IVIG, Nplate, oral prednisone and multiple platelet transfusions.  Heme-onc following.  Subjective: Seen and examined earlier this morning.  No major events overnight of this morning.  It is frustrating that her platelets continue to drop despite multiple platelet transfusion.  Fortunately, she has no significant bruising or bleeding.  Objective: Vitals:   11/08/20 1647 11/08/20 2017 11/09/20 0439 11/09/20 0847  BP: 129/60 129/70 (!) 113/59 106/67  Pulse: 92 95 79 89  Resp: 20 17 16 18   Temp: 97.8 F (36.6 C) (!) 97.5 F (36.4 C) 98.4 F (36.9 C) 98.7 F (37.1 C)  TempSrc: Oral Oral Oral Oral  SpO2: 98% 98% 99% 96%  Weight:      Height:        Intake/Output Summary (Last 24 hours) at 11/09/2020 1259 Last data filed at 11/09/2020 1236 Gross per 24 hour  Intake 1384 ml  Output 0 ml  Net 1384 ml   Filed Weights   10/29/20 0125 10/31/20 2044 11/02/20 2111  Weight: 75.7 kg 77.3 kg 77.3 kg    Examination:  GENERAL: No apparent distress.  Nontoxic. HEENT: MMM.  Vision and hearing  grossly intact.  NECK: Supple.  No apparent JVD.  RESP: On RA.  No IWOB.  Fair aeration bilaterally. CVS:  RRR. Heart sounds normal.  ABD/GI/GU: BS+. Abd soft, NTND.  MSK/EXT:  Moves extremities. No apparent deformity. No edema.  SKIN: Small bruises over antecubital fossa and right axillary area.  No hematoma NEURO: Awake and alert. Oriented appropriately.  No apparent focal neuro deficit. PSYCH: Calm. Normal affect.   Procedures:  5/20-bone marrow and right axillary lymph node biopsy by IR  Microbiology summarized: COVID-19 and influenza PCR nonreactive.  Assessment & Plan: Acute on chronic thrombocytopenia: Baseline 40-90.  Initially improved after IVIG, steroids and platelet transfusion. Now trending down despite multiple platelet transfusions (a total of 8 apheresis).  Fortunately, no signs of major bleeding Recent Labs  Lab 11/03/20 1157 11/04/20 0412 11/05/20 0340 11/06/20 0359 11/07/20 0248 11/07/20 0957 11/07/20 2324 11/08/20 0710 11/09/20 0141  PLT 16* 15* 11* 8* 6* 6* 5* 9* 5*  -Continue weekly Nplate per heme-onc -P.o. prednisone 40 mg daily per heme-onc. -Transfuse for platelets<10K or bleeding. Received 8 apheresis of platelets so far but plt dropping.  -Heme-onc following. Will discuss  Diffuse lymphadenopathy/leukocytosis: s/p bone marrow and right axillary biopsy. Differentials based on bone marrow biopsy include MPN/MDS versus [name]. Cytogenetics and FISH panel pending.  Unfortunately, right axillary biopsy was non diagnostic.  -Surgery consulted for possible excisional lymph node biopsy but has to wait due to thrombocytopenia -Heme-onc following.  Acute blood loss anemia/Normocytic anemia: H&H stable after 1  unit. Recent Labs    11/02/20 0423 11/03/20 1157 11/04/20 0412 11/05/20 0340 11/06/20 0359 11/07/20 0248 11/07/20 0957 11/07/20 2324 11/08/20 0710 11/09/20 0141  HGB 7.8* 8.4* 8.6* 8.6* 8.6* 8.7* 9.3* 8.7* 8.9* 8.7*  -Monitor  intermittently  Controlled NIDDM-2 with polyneuropathy, hypoglycemia and hyperglycemia: A1c 5.9%.  Recent Labs  Lab 11/08/20 1223 11/08/20 1653 11/08/20 2030 11/09/20 0636 11/09/20 1153  GLUCAP 177* 340* 284* 100* 152*  -Continue SSI-moderate -Increase NovoLog from 3 to 5 units 3 times daily with meals -Continue Lantus 10 units in the morning -Further adjustment as appropriate -Continue home gabapentin  Essential hypertension: Normotensive -Continue home metoprolol for now but she could benefit from losartan given diabetes.   History of noncompliance with medical treatment -She has been counseled.  She has been counseled  Leukocytosis/bandemia-likely demargination from steroid  Class I obesity Body mass index is 30.19 kg/m.         DVT prophylaxis:  SCDs Start: 10/28/20 2337  Code Status: DNR/DNI Family Communication: Patient and RN.  None at bedside. Level of care: Med-Surg Status is: Inpatient  Remains inpatient appropriate because:Ongoing diagnostic testing needed not appropriate for outpatient work up, IV treatments appropriate due to intensity of illness or inability to take PO and Inpatient level of care appropriate due to severity of illness   Dispo: The patient is from: Home              Anticipated d/c is to: Home              Patient currently is not medically stable to d/c.   Difficult to place patient No       Consultants:  Heme-onc General surgery   Sch Meds:  Scheduled Meds: . sodium chloride   Intravenous Once  . acetaminophen  650 mg Oral Once  . folic acid  1 mg Oral Daily  . gabapentin  800 mg Oral TID  . insulin aspart  0-15 Units Subcutaneous TID WC  . insulin aspart  3 Units Subcutaneous TID WC  . insulin glargine  10 Units Subcutaneous Daily  . metoprolol succinate  50 mg Oral Daily  . mirtazapine  15 mg Oral QHS  . potassium chloride  20 mEq Oral BID  . predniSONE  40 mg Oral Q breakfast   Continuous Infusions: PRN  Meds:.acetaminophen **OR** acetaminophen, melatonin, ondansetron **OR** ondansetron (ZOFRAN) IV, polyethylene glycol  Antimicrobials: Anti-infectives (From admission, onward)   None       I have personally reviewed the following labs and images: CBC: Recent Labs  Lab 11/07/20 0248 11/07/20 0957 11/07/20 2324 11/08/20 0710 11/09/20 0141  WBC 20.3* 21.7* 21.6* 19.8* 21.5*  HGB 8.7* 9.3* 8.7* 8.9* 8.7*  HCT 28.8* 29.8* 28.8* 29.3* 28.7*  MCV 92.0 90.9 92.6 92.4 93.8  PLT 6* 6* 5* 9* 5*   BMP &GFR Recent Labs  Lab 11/05/20 0340 11/06/20 0359  NA 138 136  K 4.1 4.1  CL 106 108  CO2 28 24  GLUCOSE 66* 130*  BUN 19 19  CREATININE 0.82 0.72  CALCIUM 8.6* 8.8*  MG 1.9 1.7  PHOS 3.1 3.7   Estimated Creatinine Clearance: 65.4 mL/min (by C-G formula based on SCr of 0.72 mg/dL). Liver & Pancreas: Recent Labs  Lab 11/05/20 0340 11/06/20 0359  ALBUMIN 2.8* 2.7*   No results for input(s): LIPASE, AMYLASE in the last 168 hours. No results for input(s): AMMONIA in the last 168 hours. Diabetic: No results for input(s): HGBA1C in the last 72  hours. Recent Labs  Lab 11/08/20 1223 11/08/20 1653 11/08/20 2030 11/09/20 0636 11/09/20 1153  GLUCAP 177* 340* 284* 100* 152*   Cardiac Enzymes: No results for input(s): CKTOTAL, CKMB, CKMBINDEX, TROPONINI in the last 168 hours. No results for input(s): PROBNP in the last 8760 hours. Coagulation Profile: No results for input(s): INR, PROTIME in the last 168 hours. Thyroid Function Tests: No results for input(s): TSH, T4TOTAL, FREET4, T3FREE, THYROIDAB in the last 72 hours. Lipid Profile: No results for input(s): CHOL, HDL, LDLCALC, TRIG, CHOLHDL, LDLDIRECT in the last 72 hours. Anemia Panel: No results for input(s): VITAMINB12, FOLATE, FERRITIN, TIBC, IRON, RETICCTPCT in the last 72 hours. Urine analysis:    Component Value Date/Time   COLORURINE YELLOW 09/05/2019 2240   APPEARANCEUR CLEAR 09/05/2019 2240   LABSPEC 1.010  09/05/2019 2240   PHURINE 5.5 09/05/2019 2240   GLUCOSEU NEGATIVE 09/05/2019 2240   HGBUR MODERATE (A) 09/05/2019 2240   BILIRUBINUR NEGATIVE 09/05/2019 2240   KETONESUR NEGATIVE 09/05/2019 2240   PROTEINUR 30 (A) 09/05/2019 2240   NITRITE NEGATIVE 09/05/2019 2240   LEUKOCYTESUR NEGATIVE 09/05/2019 2240   Sepsis Labs: Invalid input(s): PROCALCITONIN, [place]  Microbiology: No results found for this or any previous visit (from the past 240 hour(s)).  Radiology Studies: No results found.    Yosgart Pavey T. [place]  If 7PM-7AM, please contact night-coverage [email or web address] 11/09/2020, 12:59 PM

## 2020-11-09 NOTE — Plan of Care (Signed)
  Problem: Clinical Measurements: Goal: Will remain free from infection Outcome: Completed/Met Goal: Diagnostic test results will improve Outcome: Completed/Met Goal: Cardiovascular complication will be avoided Outcome: Completed/Met   Problem: Coping: Goal: Level of anxiety will decrease Outcome: Completed/Met   Problem: Elimination: Goal: Will not experience complications related to bowel motility Outcome: Completed/Met Goal: Will not experience complications related to urinary retention Outcome: Completed/Met

## 2020-11-10 ENCOUNTER — Encounter (HOSPITAL_COMMUNITY): Payer: Self-pay

## 2020-11-10 DIAGNOSIS — D693 Immune thrombocytopenic purpura: Secondary | ICD-10-CM | POA: Diagnosis not present

## 2020-11-10 LAB — BASIC METABOLIC PANEL
Anion gap: 7 (ref 5–15)
BUN: 20 mg/dL (ref 8–23)
CO2: 25 mmol/L (ref 22–32)
Calcium: 8.5 mg/dL — ABNORMAL LOW (ref 8.9–10.3)
Chloride: 105 mmol/L (ref 98–111)
Creatinine, Ser: 0.73 mg/dL (ref 0.44–1.00)
GFR, Estimated: >60 mL/min (ref >60)
Glucose, Bld: 145 mg/dL — ABNORMAL HIGH (ref 70–99)
Potassium: 4.3 mmol/L (ref 3.5–5.1)
Sodium: 137 mmol/L (ref 135–145)

## 2020-11-10 LAB — CBC
HCT: 28.8 % — ABNORMAL LOW (ref 36.0–46.0)
Hemoglobin: 8.6 g/dL — ABNORMAL LOW (ref 12.0–15.0)
MCH: 27.8 pg (ref 26.0–34.0)
MCHC: 29.9 g/dL — ABNORMAL LOW (ref 30.0–36.0)
MCV: 93.2 fL (ref 80.0–100.0)
Platelets: 7 10*3/uL — CL (ref 150–400)
RBC: 3.09 MIL/uL — ABNORMAL LOW (ref 3.87–5.11)
RDW: 18.7 % — ABNORMAL HIGH (ref 11.5–15.5)
WBC: 21.5 10*3/uL — ABNORMAL HIGH (ref 4.0–10.5)
nRBC: 0.2 % (ref 0.0–0.2)

## 2020-11-10 LAB — BPAM PLATELET PHERESIS
Blood Product Expiration Date: 202205311400
Blood Product Expiration Date: 202206012359
ISSUE DATE / TIME: 202205301508
ISSUE DATE / TIME: 202205301657
Unit Type and Rh: 5100
Unit Type and Rh: 6200

## 2020-11-10 LAB — BPAM RBC
Blood Product Expiration Date: 202206242359
ISSUE DATE / TIME: 202205210856
Unit Type and Rh: 5100

## 2020-11-10 LAB — TYPE AND SCREEN
ABO/RH(D): O POS
Antibody Screen: POSITIVE
DAT, IgG: POSITIVE
Unit division: 0

## 2020-11-10 LAB — PREPARE PLATELET PHERESIS
Unit division: 0
Unit division: 0

## 2020-11-10 LAB — GLUCOSE, CAPILLARY
Glucose-Capillary: 107 mg/dL — ABNORMAL HIGH (ref 70–99)
Glucose-Capillary: 212 mg/dL — ABNORMAL HIGH (ref 70–99)
Glucose-Capillary: 244 mg/dL — ABNORMAL HIGH (ref 70–99)
Glucose-Capillary: 210 mg/dL — ABNORMAL HIGH (ref 70–99)

## 2020-11-10 MED ORDER — CEFAZOLIN SODIUM-DEXTROSE 2-4 GM/100ML-% IV SOLN
2.0000 g | INTRAVENOUS | Status: AC
  Administered 2020-11-11: 2 g via INTRAVENOUS
  Filled 2020-11-10: qty 100

## 2020-11-10 MED ORDER — INSULIN ASPART 100 UNIT/ML IJ SOLN: 7.0000 [IU] | Freq: Three times a day (TID) | INTRAMUSCULAR | Status: DC

## 2020-11-10 MED ORDER — SODIUM CHLORIDE 0.9% FLUSH: 10.0000 mL | INTRAVENOUS | Status: DC | PRN

## 2020-11-10 MED ORDER — HEPARIN SOD (PORK) LOCK FLUSH 100 UNIT/ML IV SOLN
500.0000 [IU] | Freq: Every day | INTRAVENOUS | Status: DC | PRN
Start: 2020-11-10 — End: 2020-11-11
  Filled 2020-11-10: qty 5

## 2020-11-10 MED ORDER — HEPARIN SOD (PORK) LOCK FLUSH 100 UNIT/ML IV SOLN
250.0000 [IU] | INTRAVENOUS | Status: DC | PRN
  Filled 2020-11-10: qty 2.5

## 2020-11-10 MED ORDER — SODIUM CHLORIDE 0.9% IV SOLUTION
250.0000 mL | Freq: Once | INTRAVENOUS | Status: AC
  Administered 2020-11-11: 250 mL via INTRAVENOUS

## 2020-11-10 MED ORDER — SODIUM CHLORIDE 0.9% FLUSH: 3.0000 mL | INTRAVENOUS | Status: DC | PRN

## 2020-11-10 MED ORDER — PREDNISONE 20 MG PO TABS
20.0000 mg | ORAL_TABLET | Freq: Every day | ORAL | Status: DC
  Filled 2020-11-10: qty 1

## 2020-11-10 NOTE — Progress Notes (Addendum)
HEMATOLOGY-ONCOLOGY PROGRESS NOTE  SUBJECTIVE: Sitting up in the recliner this am. Denies bleeding. Has been trying to walk in the hallways some. Feels stronger.   REVIEW OF SYSTEMS:   10 Point review of Systems was done is negative except as noted above.  I have reviewed the past medical history, past surgical history, social history and family history with the patient and they are unchanged from previous note.   PHYSICAL EXAMINATION: ECOG PERFORMANCE STATUS: 2 - Symptomatic, <50% confined to bed  Vitals:   11/10/20 0548 11/10/20 0912  BP: 133/63 129/68  Pulse: 74 86  Resp: 20 18  Temp: 98 F (36.7 C) 97.9 F (36.6 C)  SpO2: 100% 99%   Filed Weights   10/29/20 0125 10/31/20 2044 11/02/20 2111  Weight: 75.7 kg 77.3 kg 77.3 kg    Intake/Output from previous day: 05/30 0701 - 05/31 0700 In: 1725.3 [P.O.:1080; Blood:645.3] Out: 0    GENERAL:alert, in no acute distress and comfortable SKIN: No rashes, a few scattered ecchymoses on arms EYES: conjunctiva are pink and non-injected, sclera anicteric OROPHARYNX: MMM, no exudates, no oropharyngeal erythema or ulceration LUNGS: clear to auscultation b/l with normal respiratory effort HEART: regular rate & rhythm Extremity: no pedal edema PSYCH: alert & oriented x 3 with fluent speech NEURO: no focal motor/sensory deficits   LABORATORY DATA:  I have reviewed the data as listed CMP Latest Ref Rng & Units 11/10/2020 11/06/2020 11/05/2020  Glucose 70 - 99 mg/dL 145(H) 130(H) 66(L)  BUN 8 - 23 mg/dL $Remove'20 19 19  'XCyngHl$ Creatinine 0.44 - 1.00 mg/dL 0.73 0.72 0.82  Sodium 135 - 145 mmol/L 137 136 138  Potassium 3.5 - 5.1 mmol/L 4.3 4.1 4.1  Chloride 98 - 111 mmol/L 105 108 106  CO2 22 - 32 mmol/L $RemoveB'25 24 28  'twggwGGS$ Calcium 8.9 - 10.3 mg/dL 8.5(L) 8.8(L) 8.6(L)  Total Protein 6.5 - 8.1 g/dL - - -  Total Bilirubin 0.3 - 1.2 mg/dL - - -  Alkaline Phos 38 - 126 U/L - - -  AST 15 - 41 U/L - - -  ALT 0 - 44 U/L - - -    Lab Results  Component  Value Date   WBC 21.5 (H) 11/10/2020   HGB 8.6 (L) 11/10/2020   HCT 28.8 (L) 11/10/2020   MCV 93.2 11/10/2020   PLT 7 (LL) 11/10/2020   NEUTROABS 5.8 11/01/2020    CT ABDOMEN PELVIS WO CONTRAST  Result Date: 10/29/2020 CLINICAL DATA:  Hematologic malignancy, staging. EXAM: CT CHEST, ABDOMEN AND PELVIS WITHOUT CONTRAST TECHNIQUE: Multidetector CT imaging of the chest, abdomen and pelvis was performed following the standard protocol without IV contrast. COMPARISON:  History of MRI diarrhea neck no palpable status chest left at [facility] 7 right-sided than she thickened change ir indications sick concern for insert at the right 6 arm. FINDINGS: CT CHEST FINDINGS Cardiovascular: The aortic and branch vessel atherosclerosis without aneurysmal dilation. Normal size heart. No significant pericardial effusion/thickening. Mediastinum/Nodes: Enlarged supraclavicular, bilateral axillary and left mammary lymph nodes. For reference: Left internal mammary lymph node measures 1.9 by 1.2 cm on image 52/7. Right axillary lymph node measures 3.1 x 2.3 cm on image 36/7. Prominent mediastinal lymph nodes measuring up to 9 mm in short axis on image 39/7, without adenopathy by size criteria. Thyroid is grossly unremarkable. Trachea esophagus are grossly unremarkable. Lungs/Pleura: Right lower lobe calcified granuloma. Bibasilar atelectasis. Scattered bilateral micro nodules measuring 1-2 mm for instance on image 96/4 in the left lower lobe and image  72/4 in the right upper lobe. Musculoskeletal: Peripherally calcified nodule in the right axilla on image 22/3. No acute osseous abnormality. CT ABDOMEN PELVIS FINDINGS Hepatobiliary: Grossly unremarkable noncontrast appearance of the hepatic parenchyma. No hepatomegaly. Gallbladder is distended with some layering hyperdense material representing sludge and tiny stones seen on prior ultrasound. No biliary ductal dilation. Pancreas: Within normal limits. Spleen: Splenomegaly measuring  22 cm in maximum craniocaudal dimension. Adrenals/Urinary Tract: Adrenal glands are unremarkable. Kidneys are normal, without renal calculi, contour deforming lesion, or hydronephrosis. Bladder is unremarkable. Stomach/Bowel: Prior gastric bypass surgery. Enteric contrast visualized to the level of the distal small bowel. No pathologic dilation of small bowel. Appendix is grossly unremarkable. Colonic diverticulosis without findings of acute diverticulitis. Vascular/Lymphatic: Aortic atherosclerosis without aneurysmal dilation. No significant interval change in the enlarged and prominent upper abdominal, retroperitoneal and mesenteric lymph nodes. Interval increase in size of the few enlarged and prominent pelvic lymph nodes. Reference lymph nodes are as follows: Periportal lymph node measuring 3.3 x 1.7 cm on image 54/3, unchanged. Aortocaval lymph node measures 1.6 x 1.5 cm on image 69/3, unchanged. Right pelvic sidewall lymph node measures 3.1 x 1.6 cm on image 109/3. Reproductive: Uterus and bilateral adnexa are unremarkable. Other: Small volume abdominopelvic ascites with diffuse mesenteric stranding. Multiple peritoneal calcifications are again visualized and unchanged from prior, likely sequela prior inflammation. Similar appearance of the nonspecific anterior abdominal wall subcutaneous calcifications. Mild diffuse subcutaneous edema. Musculoskeletal: Multilevel degenerative changes spine. No acute osseous abnormality. IMPRESSION: 1. Splenomegaly with pathologically enlarged lymph nodes above and below the diaphragm, with overall stable to minimally increased abdominal adenopathy and interval progression of the pelvic adenopathy. 2. Small volume abdominopelvic ascites with diffuse mesenteric stranding. 3. Scattered bilateral pulmonary micro nodules measuring 1-2 mm. 4. Distended gallbladder with some layering hyperdense material representing layering sludge and tiny stones seen on prior ultrasound. 5. Aortic  atherosclerosis. Aortic Atherosclerosis (ICD10-I70.0). Electronically Signed   By: Dahlia Bailiff MD   On: 10/29/2020 19:53   CT CHEST WO CONTRAST  Result Date: 10/29/2020 CLINICAL DATA:  Hematologic malignancy, staging. EXAM: CT CHEST, ABDOMEN AND PELVIS WITHOUT CONTRAST TECHNIQUE: Multidetector CT imaging of the chest, abdomen and pelvis was performed following the standard protocol without IV contrast. COMPARISON:  History of MRI diarrhea neck no palpable status chest left at [facility] 7 right-sided than she thickened change ir indications sick concern for insert at the right 6 arm. FINDINGS: CT CHEST FINDINGS Cardiovascular: The aortic and branch vessel atherosclerosis without aneurysmal dilation. Normal size heart. No significant pericardial effusion/thickening. Mediastinum/Nodes: Enlarged supraclavicular, bilateral axillary and left mammary lymph nodes. For reference: Left internal mammary lymph node measures 1.9 by 1.2 cm on image 52/7. Right axillary lymph node measures 3.1 x 2.3 cm on image 36/7. Prominent mediastinal lymph nodes measuring up to 9 mm in short axis on image 39/7, without adenopathy by size criteria. Thyroid is grossly unremarkable. Trachea esophagus are grossly unremarkable. Lungs/Pleura: Right lower lobe calcified granuloma. Bibasilar atelectasis. Scattered bilateral micro nodules measuring 1-2 mm for instance on image 96/4 in the left lower lobe and image 72/4 in the right upper lobe. Musculoskeletal: Peripherally calcified nodule in the right axilla on image 22/3. No acute osseous abnormality. CT ABDOMEN PELVIS FINDINGS Hepatobiliary: Grossly unremarkable noncontrast appearance of the hepatic parenchyma. No hepatomegaly. Gallbladder is distended with some layering hyperdense material representing sludge and tiny stones seen on prior ultrasound. No biliary ductal dilation. Pancreas: Within normal limits. Spleen: Splenomegaly measuring 22 cm in maximum craniocaudal dimension.  Adrenals/Urinary  Tract: Adrenal glands are unremarkable. Kidneys are normal, without renal calculi, contour deforming lesion, or hydronephrosis. Bladder is unremarkable. Stomach/Bowel: Prior gastric bypass surgery. Enteric contrast visualized to the level of the distal small bowel. No pathologic dilation of small bowel. Appendix is grossly unremarkable. Colonic diverticulosis without findings of acute diverticulitis. Vascular/Lymphatic: Aortic atherosclerosis without aneurysmal dilation. No significant interval change in the enlarged and prominent upper abdominal, retroperitoneal and mesenteric lymph nodes. Interval increase in size of the few enlarged and prominent pelvic lymph nodes. Reference lymph nodes are as follows: Periportal lymph node measuring 3.3 x 1.7 cm on image 54/3, unchanged. Aortocaval lymph node measures 1.6 x 1.5 cm on image 69/3, unchanged. Right pelvic sidewall lymph node measures 3.1 x 1.6 cm on image 109/3. Reproductive: Uterus and bilateral adnexa are unremarkable. Other: Small volume abdominopelvic ascites with diffuse mesenteric stranding. Multiple peritoneal calcifications are again visualized and unchanged from prior, likely sequela prior inflammation. Similar appearance of the nonspecific anterior abdominal wall subcutaneous calcifications. Mild diffuse subcutaneous edema. Musculoskeletal: Multilevel degenerative changes spine. No acute osseous abnormality. IMPRESSION: 1. Splenomegaly with pathologically enlarged lymph nodes above and below the diaphragm, with overall stable to minimally increased abdominal adenopathy and interval progression of the pelvic adenopathy. 2. Small volume abdominopelvic ascites with diffuse mesenteric stranding. 3. Scattered bilateral pulmonary micro nodules measuring 1-2 mm. 4. Distended gallbladder with some layering hyperdense material representing layering sludge and tiny stones seen on prior ultrasound. 5. Aortic atherosclerosis. Aortic Atherosclerosis  (ICD10-I70.0). Electronically Signed   By: Dahlia Bailiff MD   On: 10/29/2020 19:53   CT BIOPSY  Result Date: 10/30/2020 INDICATION: 70 year old female presents for ultrasound-guided right axillary node biopsy EXAM: ULTRASOUND-GUIDED RIGHT AXILLARY NODE BIOPSY MEDICATIONS: None. ANESTHESIA/SEDATION: Moderate (conscious) sedation was employed during this procedure, as the same moderate sedation that was performed during the accession number [id number]. Same sedation time for sequential procedures. A total of Versed 3.0 mg and Fentanyl 125 mcg was administered intravenously. Moderate Sedation Time: 42 minutes. The patient's level of consciousness and vital signs were monitored continuously by radiology nursing throughout the procedure under my direct supervision. FLUOROSCOPY TIME:  ULTRASOUND COMPLICATIONS: NONE PROCEDURE: Informed written consent was obtained from the patient after a thorough discussion of the procedural risks, benefits and alternatives. All questions were addressed. Maximal Sterile Barrier Technique was utilized including caps, mask, sterile gowns, sterile gloves, sterile drape, hand hygiene and skin antiseptic. A timeout was performed prior to the initiation of the procedure. After a bone marrow biopsy was performed under CT guidance, the patient was turned into the supine position on her stretcher. Ultrasound images of the right axillary region were performed with images stored. The right axillary region was prepped with chlorhexidine in a sterile fashion, and a sterile drape was applied covering the operative field. A sterile gown and sterile gloves were used for the procedure. Local anesthesia was provided with 1% Lidocaine. Ultrasound guidance was used to infiltrate the region with 1% lidocaine for local anesthesia. Five separate 18 gauge core biopsy were then acquired of the right axillary node using ultrasound guidance. Specimens placed into fresh specimen/saline. Patient tolerated the  procedure well and remained hemodynamically stable throughout. No complications were encountered and no significant blood loss was encounter IMPRESSION: Status post ultrasound-guided biopsy of right axillary lymph node. Signed, Dulcy Fanny. Dellia Nims, RPVI [facility] Electronically Signed   By: Corrie Mckusick D.O.   On: 10/30/2020 11:05   CT BONE MARROW BIOPSY & ASPIRATION  Result  Date: 10/30/2020 INDICATION: 70 year old female referred for bone marrow biopsy EXAM: CT BONE MARROW BIOPSY AND ASPIRATION MEDICATIONS: None. ANESTHESIA/SEDATION: Moderate (conscious) sedation was employed during this procedure. A total of Versed 3.0 mg and Fentanyl 125 mcg was administered intravenously. Moderate Sedation Time: 42 minutes. The patient's level of consciousness and vital signs were monitored continuously by radiology nursing throughout the procedure under my direct supervision. FLUOROSCOPY TIME:  CT COMPLICATIONS: None PROCEDURE: The procedure risks, benefits, and alternatives were explained to the patient. Questions regarding the procedure were encouraged and answered. The patient understands and consents to the procedure. Scout CT of the pelvis was performed for surgical planning purposes. The posterior pelvis was prepped with Chlorhexidine in a sterile fashion, and a sterile drape was applied covering the operative field. A sterile gown and sterile gloves were used for the procedure. Local anesthesia was provided with 1% Lidocaine. Posterior right iliac bone was targeted for biopsy. The skin and subcutaneous tissues were infiltrated with 1% lidocaine without epinephrine. A small stab incision was made with an 11 blade scalpel, and an 11 gauge Murphy needle was advanced with CT guidance to the posterior cortex. Manual forced was used to advance the needle through the posterior cortex and the stylet was removed. A bone marrow aspirate was retrieved and passed to a  cytotechnologist in the room. The Murphy needle was then advanced without the stylet for a core biopsy. The core biopsy was retrieved and also passed to a cytotechnologist. Manual pressure was used for hemostasis and a sterile dressing was placed. No complications were encountered no significant blood loss was encountered. Patient tolerated the procedure well and remained hemodynamically stable throughout. After the bone marrow biopsy was completed, the patient was then positioned supine on her stretcher and we proceeded with the next staff of the planned case, a right axillary lymph node ultrasound-guided biopsy. The same sedation time is applied for both procedures. IMPRESSION: Status post CT-guided bone marrow biopsy, with tissue specimen sent to pathology for complete histopathologic analysis Signed, Dulcy Fanny. Earleen Newport, DO [facility] Electronically Signed   By: Corrie Mckusick D.O.   On: 10/30/2020 11:02   US Abdomen Limited RUQ (LIVER/GB)  Result Date: 10/28/2020 CLINICAL DATA:  Follow-up cirrhosis EXAM: ULTRASOUND ABDOMEN LIMITED RIGHT UPPER QUADRANT COMPARISON:  09/05/2019 FINDINGS: Gallbladder: Gallbladder is well distended with gallbladder sludge within. Previously seen calculi are noted within the dependent sludge. No wall thickening or pericholecystic fluid is noted. Negative sonographic Murphy's sign is elicited. Common bile duct: Diameter: 2.3 mm. Liver: No focal lesion identified. Within normal limits in parenchymal echogenicity. Portal vein is patent on color Doppler imaging with normal direction of blood flow towards the liver. Other: None. IMPRESSION: Gallbladder sludge and cholelithiasis. No complicating factors are noted. Electronically Signed   By: Inez Catalina M.D.   On: 10/28/2020 23:34    ASSESSMENT AND PLAN:  Ms. Vallely is a 70 year old female with history of ITP and lymphadenopathy who presented to the hospital with severe  thrombocytopenia and anemia.  1. Severe thrombocytopenia 2. Normocytic anemia 3. Lymphadenopathy and splenomegaly concerning for neoplastic process - ? Low grade NHL vs other etiology like sarcoidosis. 4. Type 2 diabetes mellitus with polyneuropathy 5.  Hypertension  PLAN -Status post IVIG and prednisone. Has received 12 units of platelets so far this admission.  She was on 60 mg daily without significant improvement and has now been weaned down to 40 mg daily. -Platelet count 6000 today, not improving.  -Received  Nplate 3 mcg/kg 2/77.  Plan to continue this weekly. -continue to transfuse PRBC for hgb<7.5 or if symptomatic -transfuse platelets for PLT count <10k or if bleeding -Discussed with the patient and her family that her low platelets are likely related to ITP. -Bone marrow biopsy results shows increased blood cells with some abnormalities.  There was no evidence of acute leukemia or high-grade lymphoma.  However definitive diagnosis not able to be made at this time based on bone marrow biopsy.  Differentials include MPN/MDS versus [name].  We will marrow biopsy has been sent for cytogenetics and FISH panel.  This may take up to 2 weeks to result.  We will plan to discuss the final results once they are available to [place]. -Lymph node biopsy nondiagnostic.  General surgery has seen the patient and will consider excisional lymph node biopsy when platelet count improves. Likely to be performed as an outpatient.  -Prednisone is currently being tapered due to not having significant response.  Currently on 40 mg daily. -May consider addition of Rituxan. Further discussion later today per Dr. Burr Medico.  -Okay to discharge to home if platelet count rises above 15,000 and remains stable.  We will plan for close outpatient follow-up in our clinic.  Mikey Bussing, DNP, AGPCNP-BC, AOCNP  Addendum  I have seen the patient, examined her. I agree with the assessment and and plan and have edited the notes.    I have spoken with Dr. Harlow Asa today and appreciate his assistance. Plan to have left axillary node surgical biopsy tomorrow (time to be determined tomorrow morning), I will order 2u plt tomorrow, one unit before surgery and one in OR. Since she did not respond well to plt transfusion, will get post transfusion CBC 10-60 mins after plt transfusion. I called blood bank, spoke with blood bank staff, she talked to Dr. Saralyn Pilar. Will send her blood sample to reference lab tonight to find cross matched plt. Please to give cross-matched plt if available tomorrow (ok to give random plt if not available). I placed the orders. I will also check her HLA type and HLA antibody. Her splenmagely probably plays a major role in her poor response to plt transfusion. I told her LN biopsy result will take 2-7 days to back. I spoke with pathologist Dr. Audie Box this morning also, cytogenetics from [facility] sample may be back this week, hopefully in a few days. I will reduce her prednisone to $RemoveBefor'20mg'[id number]$  daily tomorrow, f/u in 2-3 days. I may consider rituximab later this week, will decide after her node biopsy. I spoke with her daughter today when I saw her. Appreciate all the help from hospitalist and surgical teams.  Truitt Merle  11/10/2020

## 2020-11-10 NOTE — Progress Notes (Signed)
PROGRESS NOTE  Kathleen Gibson:[id number] DOB: January 17, 1951   PCP: [name], DO  Patient is from: Home  DOA: 10/28/2020 LOS: 48  Chief complaints: Low platelet  Brief Narrative / Interim history: 70 year old F with PMH of DM-2 with neuropathy, ITP, gastric bypass, HTN, NAFLD and noncompliance presented to ED with severe thrombocytopenia, and admitted for acute on chronic ITP.  Platelet was 5K.  Treated with platelet transfusion (4 apheresis), IVIG, Nplate and oral prednisone with some initial improvement.   Patient had bone marrow and right axillary lymph node biopsy performed on 5/20. Differentials based on bone marrow biopsy include MPN/MDS versus [name]. Cytogenetics and FISH panel pending.  Unfortunately, right axillary biopsy was non diagnostic.  General surgery consulted for excisional lymph node biopsy but here persistent thrombocytopenia is a barrier.   Platelet is downtrended despite IVIG, Nplate, oral prednisone and multiple platelet transfusions.  Heme-onc following.  General surgery consulted for excisional lymph node biopsy, which is planned for 6/1.  Subjective: Seen and examined earlier this morning.  No major events overnight of this morning.  No complaints.  No further bruising or signs or symptoms of bleeding.  Platelets remains low at 7000.  Objective: Vitals:   11/09/20 1805 11/09/20 2004 11/10/20 0548 11/10/20 0912  BP: 116/65 110/69 133/63 129/68  Pulse: 86 (!) 101 74 86  Resp: _0 Temp: 97.8 F (36.6 C) 97.8 F (36.6 C) 98 F (36.7 C) 97.9 F (36.6 C)  TempSrc: Oral Oral Oral Oral  SpO2: 98% 98% 100% 99%  Weight:      Height:        Intake/Output Summary (Last 24 hours) at 11/10/2020 1700 Last data filed at 11/10/2020 1000 Gross per 24 hour  Intake 1485.33 ml  Output 0 ml  Net 1485.33 ml   Filed Weights   10/29/20 0125 10/31/20 2044 11/02/20 2111  Weight: 75.7 kg 77.3 kg 77.3 kg    Examination:  GENERAL: No apparent distress.   Nontoxic. HEENT: MMM.  Vision and hearing grossly intact.  NECK: Supple.  No apparent JVD.  RESP: On RA.  No IWOB.  Fair aeration bilaterally. CVS:  RRR. Heart sounds normal.  ABD/GI/GU: BS+. Abd soft, NTND.  MSK/EXT:  Moves extremities. No apparent deformity. No edema.  SKIN: Small bruises over antecubital fossa and right axillary area.  No hematoma NEURO: Awake and alert. Oriented appropriately.  No apparent focal neuro deficit. PSYCH: Calm. Normal affect.   Procedures:  5/20-bone marrow and right axillary lymph node biopsy by IR  Microbiology summarized: COVID-19 and influenza PCR nonreactive.  Assessment & Plan: Acute on chronic thrombocytopenia: Baseline 40-90.  Initially improved after IVIG, steroids and platelet transfusion. Now trending down despite multiple platelet transfusions.  Also concern about possible splenic sequestration.  Fortunately, no signs of major bleeding Recent Labs  Lab 11/04/20 0412 11/05/20 0340 11/06/20 0359 11/07/20 0248 11/07/20 0957 11/07/20 2324 11/08/20 0710 11/09/20 0141 11/10/20 0234  PLT 15* 11* 8* 6* 6* 5* 9* 5* 7*  -Continue weekly Nplate per heme-onc -Heme-onc tapering prednisone. -Transfuse for platelets<10K or bleeding. Received 10 apheresis of platelets so far -Heme-onc following  Diffuse lymphadenopathy/leukocytosis: s/p bone marrow and right axillary biopsy. Differentials based on bone marrow biopsy include MPN/MDS versus [name]. Cytogenetics and FISH panel pending.  Unfortunately, right axillary biopsy was non diagnostic.  -Plan for excisional lymph node biopsy on 6/1.  Patient to receive 2 units of platelet perioperatively -Heme-onc following.  Acute blood loss anemia/Normocytic anemia: H&H  stable after 1 unit. Recent Labs    11/03/20 1157 11/04/20 0412 11/05/20 0340 11/06/20 0359 11/07/20 0248 11/07/20 0957 11/07/20 2324 11/08/20 0710 11/09/20 0141 11/10/20 0234  HGB 8.4* 8.6* 8.6* 8.6* 8.7* 9.3* 8.7* 8.9* 8.7* 8.6*   -Monitor intermittently  Controlled NIDDM-2 with polyneuropathy, hypoglycemia and hyperglycemia: A1c 5.9%.  Recent Labs  Lab 11/09/20 1153 11/09/20 1644 11/10/20 0715 11/10/20 1144 11/10/20 1654  GLUCAP 152* 246* 107* 212* 244*  -Continue SSI-moderate -Increase NovoLog from 5 to 7 units 3 times daily with meals -Continue Lantus 10 units in the morning -Further adjustment as appropriate -Continue home gabapentin  Essential hypertension: Normotensive -Continue home metoprolol for now but she could benefit from losartan given diabetes.   History of noncompliance with medical treatment -She has been counseled.  She has been counseled  Leukocytosis/bandemia-likely demargination from steroid  Class I obesity Body mass index is 30.19 kg/m.         DVT prophylaxis:  SCDs Start: 10/28/20 2337  Code Status: DNR/DNI Family Communication: Patient and RN.  None at bedside. Level of care: Med-Surg Status is: Inpatient  Remains inpatient appropriate because:Ongoing diagnostic testing needed not appropriate for outpatient work up, IV treatments appropriate due to intensity of illness or inability to take PO and Inpatient level of care appropriate due to severity of illness   Dispo: The patient is from: Home              Anticipated d/c is to: Home              Patient currently is not medically stable to d/c.   Difficult to place patient No       Consultants:  Heme-onc General surgery   Sch Meds:  Scheduled Meds: . folic acid  1 mg Oral Daily  . gabapentin  800 mg Oral TID  . insulin aspart  0-15 Units Subcutaneous TID WC  . insulin aspart  5 Units Subcutaneous TID WC  . insulin glargine  10 Units Subcutaneous Daily  . metoprolol succinate  50 mg Oral Daily  . mirtazapine  15 mg Oral QHS  . potassium chloride  20 mEq Oral BID  . [START ON 11/11/2020] predniSONE  20 mg Oral Q breakfast   Continuous Infusions: . [START ON 11/11/2020]  ceFAZolin (ANCEF) IV     PRN  Meds:.acetaminophen **OR** acetaminophen, melatonin, ondansetron **OR** ondansetron (ZOFRAN) IV, polyethylene glycol  Antimicrobials: Anti-infectives (From admission, onward)   Start     Dose/Rate Route Frequency Ordered Stop   11/11/20 0600  ceFAZolin (ANCEF) IVPB 2g/100 mL premix        2 g 200 mL/hr over 30 Minutes Intravenous On call to O.R. 11/10/20 1553 11/12/20 0559       I have personally reviewed the following labs and images: CBC: Recent Labs  Lab 11/07/20 0957 11/07/20 2324 11/08/20 0710 11/09/20 0141 11/10/20 0234  WBC 21.7* 21.6* 19.8* 21.5* 21.5*  HGB 9.3* 8.7* 8.9* 8.7* 8.6*  HCT 29.8* 28.8* 29.3* 28.7* 28.8*  MCV 90.9 92.6 92.4 93.8 93.2  PLT 6* 5* 9* 5* 7*   BMP &GFR Recent Labs  Lab 11/05/20 0340 11/06/20 0359 11/10/20 0234  NA 138 136 137  K 4.1 4.1 4.3  CL 106 108 105  CO2 _0 GLUCOSE 66* 130* 145*  BUN _1 CREATININE 0.82 0.72 0.73  CALCIUM 8.6* 8.8* 8.5*  MG 1.9 1.7  --   PHOS 3.1 3.7  --    Estimated  Creatinine Clearance: 65.4 mL/min (by C-G formula based on SCr of 0.73 mg/dL). Liver & Pancreas: Recent Labs  Lab 11/05/20 0340 11/06/20 0359  ALBUMIN 2.8* 2.7*   No results for input(s): LIPASE, AMYLASE in the last 168 hours. No results for input(s): AMMONIA in the last 168 hours. Diabetic: No results for input(s): HGBA1C in the last 72 hours. Recent Labs  Lab 11/09/20 1153 11/09/20 1644 11/10/20 0715 11/10/20 1144 11/10/20 1654  GLUCAP 152* 246* 107* 212* 244*   Cardiac Enzymes: No results for input(s): CKTOTAL, CKMB, CKMBINDEX, TROPONINI in the last 168 hours. No results for input(s): PROBNP in the last 8760 hours. Coagulation Profile: No results for input(s): INR, PROTIME in the last 168 hours. Thyroid Function Tests: No results for input(s): TSH, T4TOTAL, FREET4, T3FREE, THYROIDAB in the last 72 hours. Lipid Profile: No results for input(s): CHOL, HDL, LDLCALC, TRIG, CHOLHDL, LDLDIRECT in the last 72  hours. Anemia Panel: No results for input(s): VITAMINB12, FOLATE, FERRITIN, TIBC, IRON, RETICCTPCT in the last 72 hours. Urine analysis:    Component Value Date/Time   COLORURINE YELLOW 09/05/2019 2240   APPEARANCEUR CLEAR 09/05/2019 2240   LABSPEC 1.010 09/05/2019 2240   PHURINE 5.5 09/05/2019 2240   GLUCOSEU NEGATIVE 09/05/2019 2240   HGBUR MODERATE (A) 09/05/2019 2240   BILIRUBINUR NEGATIVE 09/05/2019 2240   KETONESUR NEGATIVE 09/05/2019 2240   PROTEINUR 30 (A) 09/05/2019 2240   NITRITE NEGATIVE 09/05/2019 2240   LEUKOCYTESUR NEGATIVE 09/05/2019 2240   Sepsis Labs: Invalid input(s): PROCALCITONIN, [place]  Microbiology: No results found for this or any previous visit (from the past 240 hour(s)).  Radiology Studies: No results found.    Kalen Neidert T. Laurel  If 7PM-7AM, please contact night-coverage [email or web address] 11/10/2020, 5:00 PM

## 2020-11-10 NOTE — Anesthesia Preprocedure Evaluation (Addendum)
Anesthesia Evaluation  Patient identified by MRN, date of birth, ID band Patient awake    Reviewed: Allergy & Precautions, NPO status , Patient's Chart, lab work & pertinent test results  Airway Mallampati: II  TM Distance: >3 FB Neck ROM: Full    Dental  (+) Teeth Intact   Pulmonary neg pulmonary ROS,    Pulmonary exam normal        Cardiovascular hypertension, Pt. on medications and Pt. on home beta blockers  Rhythm:Regular Rate:Normal     Neuro/Psych negative neurological ROS  negative psych ROS   GI/Hepatic negative GI ROS, (+) Cirrhosis       ,   Endo/Other  diabetes, Type 2  Renal/GU negative Renal ROS  negative genitourinary   Musculoskeletal negative musculoskeletal ROS (+)   Abdominal (+)  Abdomen: soft. Bowel sounds: normal.  Peds  Hematology  (+) Blood dyscrasia, anemia , ITP with plt count 8K. Plan for pre-operative transfusion   Anesthesia Other Findings   Reproductive/Obstetrics                           Anesthesia Physical Anesthesia Plan  ASA: III  Anesthesia Plan: MAC   Post-op Pain Management:    Induction:   PONV Risk Score and Plan: 2 and Propofol infusion, Ondansetron, Dexamethasone and Midazolam  Airway Management Planned: Simple Face Mask, Natural Airway and Nasal Cannula  Additional Equipment: None  Intra-op Plan:   Post-operative Plan:   Informed Consent: I have reviewed the patients History and Physical, chart, labs and discussed the procedure including the risks, benefits and alternatives for the proposed anesthesia with the patient or authorized representative who has indicated his/her understanding and acceptance.   Patient has DNR.   Dental advisory given  Plan Discussed with: CRNA  Anesthesia Plan Comments: (Lab Results      Component                Value               Date                      WBC                      21.5 (H)             11/10/2020                HGB                      8.6 (L)             11/10/2020                HCT                      28.8 (L)            11/10/2020                MCV                      93.2                11/10/2020                PLT  7 (LL)              11/10/2020           Lab Results      Component                Value               Date                      NA                       137                 11/10/2020                K                        4.3                 11/10/2020                CO2                      25                  11/10/2020                GLUCOSE                  145 (H)             11/10/2020                BUN                      20                  11/10/2020                CREATININE               0.73                11/10/2020                CALCIUM                  8.5 (L)             11/10/2020                GFRNONAA                 >60                 11/10/2020                GFRAA                    45 (L)              09/07/2019          )       Anesthesia Quick Evaluation

## 2020-11-10 NOTE — Progress Notes (Signed)
Subjective: No new complaints  ROS: See above, otherwise other systems negative  Objective: Vital signs in last 24 hours: Temp:  [97.6 F (36.4 C)-98 F (36.7 C)] 97.9 F (36.6 C) (05/31 0912) Pulse Rate:  [74-101] 86 (05/31 0912) Resp:  [16-20] 18 (05/31 0912) BP: (109-133)/(58-69) 129/68 (05/31 0912) SpO2:  [97 %-100 %] 99 % (05/31 0912) Last BM Date: 11/08/20  Intake/Output from previous day: 05/30 0701 - 05/31 0700 In: 1725.3 [P.O.:1080; Blood:645.3] Out: 0  Intake/Output this shift: No intake/output data recorded.  PE: Lymph: difficult to palpate any definite nodes.  May feel an enlarged node in her left axilla.  Right axilla still with hematoma.  No definite cervical, supraclavicular, inguinal nodes palpated by myself  Lab Results:  Recent Labs    11/09/20 0141 11/10/20 0234  WBC 21.5* 21.5*  HGB 8.7* 8.6*  HCT 28.7* 28.8*  PLT 5* 7*   BMET Recent Labs    11/10/20 0234  NA 137  K 4.3  CL 105  CO2 25  GLUCOSE 145*  BUN 20  CREATININE 0.73  CALCIUM 8.5*   PT/INR No results for input(s): LABPROT, INR in the last 72 hours. CMP     Component Value Date/Time   NA 137 11/10/2020 0234   K 4.3 11/10/2020 0234   CL 105 11/10/2020 0234   CO2 25 11/10/2020 0234   GLUCOSE 145 (H) 11/10/2020 0234   BUN 20 11/10/2020 0234   CREATININE 0.73 11/10/2020 0234   CREATININE 1.03 11/20/2017 0924   CALCIUM 8.5 (L) 11/10/2020 0234   PROT 7.8 10/29/2020 0734   ALBUMIN 2.7 (L) 11/06/2020 0359   AST 16 10/29/2020 0734   AST 12 11/20/2017 0924   ALT 9 10/29/2020 0734   ALT 11 11/20/2017 0924   ALKPHOS 40 10/29/2020 0734   BILITOT 0.9 10/29/2020 0734   BILITOT 0.4 11/20/2017 0924   GFRNONAA >60 11/10/2020 0234   GFRNONAA 55 (L) 11/20/2017 0924   GFRAA 45 (L) 09/07/2019 0350   GFRAA >60 11/20/2017 0924   Lipase     Component Value Date/Time   LIPASE 25 07/24/2018 0706       Studies/Results: No results  found.  Anti-infectives: Anti-infectives (From admission, onward)   None       Assessment/Plan T2DM with diabetic polyneuropathy HLD HTN pernicious anemia/B12 deficiency NAFLD medical noncompliance Severe thrombocytopenia - has not responded yet to treatment thus far ITP  Lymphadenopathy and splenomegaly  -bone marrow biopsy results not c/w leukemia or high grade lymphoma; however this is not a definitive negative results.  She could have MPN/MDS vs CML. -may have palpable node in L axilla.  Biggest issue at this time is she has not responded to therapy thus far with any improvement in her plts.  Will d/w MD regarding how we proceed.  FEN - carb mod VTE - on hold due to above ID - none   LOS: 12 days    Henreitta Cea , [facility] 11/10/2020, 10:12 AM Please see Amion for pager number during day hours 7:00am-4:30pm or 7:00am -11:30am on weekends

## 2020-11-10 NOTE — H&P (View-Only) (Signed)
Patient ID: Kathleen Gibson, female   DOB: 02-Jan-1951, 70 y.o.   MRN: [id number]  [facility]  Patient seen with Dr. Burr Medico and surgery discussed with patient and her daughter, Kathleen Gibson, on the telephone.  Plan left axillary excisional lymph node biopsy tomorrow.  Plan infusion of platelets immediately before and during the procedure.  Discussed risks of bleeding.  Patient understands and agrees to proceed.  The risks and benefits of the procedure have been discussed at length with the patient.  The patient understands the proposed procedure, potential alternative treatments, and the course of recovery to be expected.  All of the patient's questions have been answered at this time.  The patient wishes to proceed with surgery.  [name], MD [facility] Office: [phone]

## 2020-11-10 NOTE — Care Management Important Message (Signed)
Important Message  Patient Details  Name: Kathleen Gibson MRN: [id number] Date of Birth: 1950/06/28   Medicare Important Message Given:  Yes     [place] 11/10/2020, 1:02 PM

## 2020-11-10 NOTE — Progress Notes (Signed)
Patient ID: Kathleen Gibson, female   DOB: 1951-04-29, 70 y.o.   MRN: [id number]  [facility]  Patient seen with Dr. Burr Medico and surgery discussed with patient and her daughter, Kathleen Gibson, on the telephone.  Plan left axillary excisional lymph node biopsy tomorrow.  Plan infusion of platelets immediately before and during the procedure.  Discussed risks of bleeding.  Patient understands and agrees to proceed.  The risks and benefits of the procedure have been discussed at length with the patient.  The patient understands the proposed procedure, potential alternative treatments, and the course of recovery to be expected.  All of the patient's questions have been answered at this time.  The patient wishes to proceed with surgery.  [name], MD [facility] Office: [phone]

## 2020-11-11 ENCOUNTER — Encounter (HOSPITAL_COMMUNITY): Payer: Self-pay

## 2020-11-11 ENCOUNTER — Encounter (HOSPITAL_COMMUNITY): Payer: Self-pay | Admitting: Internal Medicine

## 2020-11-11 ENCOUNTER — Inpatient Hospital Stay (HOSPITAL_COMMUNITY): Payer: Medicare HMO | Admitting: Anesthesiology

## 2020-11-11 ENCOUNTER — Encounter (HOSPITAL_COMMUNITY)
Admission: EM | Disposition: A | Discharge status: Home / Self Care | Payer: Self-pay | Source: Home / Self Care | Attending: Student

## 2020-11-11 HISTORY — PX: AXILLARY LYMPH NODE BIOPSY: SHX5737

## 2020-11-11 LAB — CBC
HCT: 27.6 % — ABNORMAL LOW (ref 36.0–46.0)
Hemoglobin: 8.2 g/dL — ABNORMAL LOW (ref 12.0–15.0)
MCH: 27.6 pg (ref 26.0–34.0)
MCHC: 29.7 g/dL — ABNORMAL LOW (ref 30.0–36.0)
MCV: 92.9 fL (ref 80.0–100.0)
Platelets: 8 10*3/uL — CL (ref 150–400)
RBC: 2.97 MIL/uL — ABNORMAL LOW (ref 3.87–5.11)
RDW: 18.6 % — ABNORMAL HIGH (ref 11.5–15.5)
WBC: 18.6 10*3/uL — ABNORMAL HIGH (ref 4.0–10.5)
nRBC: 0.2 % (ref 0.0–0.2)

## 2020-11-11 LAB — GLUCOSE, CAPILLARY
Glucose-Capillary: 101 mg/dL — ABNORMAL HIGH (ref 70–99)
Glucose-Capillary: 155 mg/dL — ABNORMAL HIGH (ref 70–99)
Glucose-Capillary: 351 mg/dL — ABNORMAL HIGH (ref 70–99)
Glucose-Capillary: 216 mg/dL — ABNORMAL HIGH (ref 70–99)

## 2020-11-11 SURGERY — AXILLARY LYMPH NODE BIOPSY
Anesthesia: Monitor Anesthesia Care | Laterality: Left

## 2020-11-11 MED ORDER — CHLORHEXIDINE GLUCONATE 0.12 % MT SOLN
OROMUCOSAL | Status: AC
  Administered 2020-11-11: 15 mL via OROMUCOSAL
  Filled 2020-11-11: qty 15

## 2020-11-11 MED ORDER — TRAMADOL HCL 50 MG PO TABS: 50.0000 mg | ORAL_TABLET | Freq: Four times a day (QID) | ORAL | Status: DC | PRN

## 2020-11-11 MED ORDER — FENTANYL CITRATE (PF) 100 MCG/2ML IJ SOLN: 25.0000 ug | INTRAMUSCULAR | Status: DC | PRN

## 2020-11-11 MED ORDER — ONDANSETRON 4 MG PO TBDP: 4.0000 mg | ORAL_TABLET | Freq: Four times a day (QID) | ORAL | Status: DC | PRN

## 2020-11-11 MED ORDER — INSULIN GLARGINE 100 UNIT/ML ~~LOC~~ SOLN
15.0000 [IU] | Freq: Every day | SUBCUTANEOUS | Status: DC
  Administered 2020-11-12 – 2020-11-20 (×9): 15 [IU] via SUBCUTANEOUS
  Filled 2020-11-11 (×9): qty 0.15

## 2020-11-11 MED ORDER — BUPIVACAINE-EPINEPHRINE (PF) 0.25% -1:200000 IJ SOLN
INTRAMUSCULAR | Status: AC
  Filled 2020-11-11: qty 30

## 2020-11-11 MED ORDER — LIDOCAINE 2% (20 MG/ML) 5 ML SYRINGE
INTRAMUSCULAR | Status: AC
  Filled 2020-11-11: qty 5

## 2020-11-11 MED ORDER — ONDANSETRON HCL 4 MG/2ML IJ SOLN: 4.0000 mg | Freq: Four times a day (QID) | INTRAMUSCULAR | Status: DC | PRN

## 2020-11-11 MED ORDER — INSULIN ASPART 100 UNIT/ML IJ SOLN
0.0000 [IU] | Freq: Three times a day (TID) | INTRAMUSCULAR | Status: DC
  Administered 2020-11-11: 15 [IU] via SUBCUTANEOUS
  Administered 2020-11-11: 3 [IU] via SUBCUTANEOUS
  Administered 2020-11-12: 8 [IU] via SUBCUTANEOUS
  Administered 2020-11-12: 2 [IU] via SUBCUTANEOUS
  Administered 2020-11-13: 5 [IU] via SUBCUTANEOUS
  Administered 2020-11-13: 2 [IU] via SUBCUTANEOUS
  Administered 2020-11-14: 5 [IU] via SUBCUTANEOUS
  Administered 2020-11-14: 2 [IU] via SUBCUTANEOUS
  Administered 2020-11-15: 5 [IU] via SUBCUTANEOUS
  Administered 2020-11-15: 3 [IU] via SUBCUTANEOUS
  Administered 2020-11-16: 2 [IU] via SUBCUTANEOUS
  Administered 2020-11-16: 11 [IU] via SUBCUTANEOUS
  Administered 2020-11-17 (×2): 2 [IU] via SUBCUTANEOUS
  Administered 2020-11-18: 3 [IU] via SUBCUTANEOUS

## 2020-11-11 MED ORDER — MIDAZOLAM HCL 5 MG/5ML IJ SOLN
INTRAMUSCULAR | Status: DC | PRN
  Administered 2020-11-11 (×2): 1 mg via INTRAVENOUS

## 2020-11-11 MED ORDER — ACETAMINOPHEN 10 MG/ML IV SOLN: 1000.0000 mg | Freq: Once | INTRAVENOUS | Status: DC | PRN

## 2020-11-11 MED ORDER — MIRTAZAPINE 15 MG PO TBDP
15.0000 mg | ORAL_TABLET | Freq: Every day | ORAL | Status: DC
  Administered 2020-11-11 – 2020-11-19 (×9): 15 mg via ORAL
  Filled 2020-11-11 (×9): qty 1

## 2020-11-11 MED ORDER — 0.9 % SODIUM CHLORIDE (POUR BTL) OPTIME
TOPICAL | Status: DC | PRN
  Administered 2020-11-11: 1000 mL

## 2020-11-11 MED ORDER — MIDAZOLAM HCL 2 MG/2ML IJ SOLN
INTRAMUSCULAR | Status: AC
  Filled 2020-11-11: qty 2

## 2020-11-11 MED ORDER — PROPOFOL 10 MG/ML IV BOLUS
INTRAVENOUS | Status: AC
  Filled 2020-11-11: qty 20

## 2020-11-11 MED ORDER — ACETAMINOPHEN 325 MG PO TABS
650.0000 mg | ORAL_TABLET | Freq: Four times a day (QID) | ORAL | Status: DC | PRN
  Administered 2020-11-12 – 2020-11-18 (×4): 650 mg via ORAL
  Filled 2020-11-11 (×4): qty 2

## 2020-11-11 MED ORDER — PREDNISONE 20 MG PO TABS
20.0000 mg | ORAL_TABLET | Freq: Every day | ORAL | Status: DC
  Administered 2020-11-11 – 2020-11-12 (×2): 20 mg via ORAL
  Filled 2020-11-11: qty 1

## 2020-11-11 MED ORDER — GABAPENTIN 400 MG PO CAPS
800.0000 mg | ORAL_CAPSULE | Freq: Three times a day (TID) | ORAL | Status: DC
  Administered 2020-11-11 – 2020-11-20 (×27): 800 mg via ORAL
  Filled 2020-11-11 (×26): qty 2

## 2020-11-11 MED ORDER — SODIUM CHLORIDE 0.45 % IV SOLN: INTRAVENOUS | Status: DC

## 2020-11-11 MED ORDER — ONDANSETRON HCL 4 MG/2ML IJ SOLN
INTRAMUSCULAR | Status: DC | PRN
  Administered 2020-11-11: 4 mg via INTRAVENOUS

## 2020-11-11 MED ORDER — ACETAMINOPHEN 650 MG RE SUPP
650.0000 mg | Freq: Four times a day (QID) | RECTAL | Status: DC | PRN
Start: 2020-11-11 — End: 2020-11-20

## 2020-11-11 MED ORDER — LIDOCAINE HCL (PF) 1 % IJ SOLN
INTRAMUSCULAR | Status: AC
  Filled 2020-11-11: qty 30

## 2020-11-11 MED ORDER — VITAMIN D 25 MCG (1000 UNIT) PO TABS
1000.0000 [IU] | ORAL_TABLET | Freq: Every day | ORAL | Status: DC
  Administered 2020-11-12 – 2020-11-20 (×9): 1000 [IU] via ORAL
  Filled 2020-11-11 (×10): qty 1

## 2020-11-11 MED ORDER — PROPOFOL 500 MG/50ML IV EMUL
INTRAVENOUS | Status: DC | PRN
  Administered 2020-11-11: 100 ug/kg/min via INTRAVENOUS

## 2020-11-11 MED ORDER — INSULIN ASPART 100 UNIT/ML IJ SOLN
6.0000 [IU] | Freq: Three times a day (TID) | INTRAMUSCULAR | Status: DC
  Administered 2020-11-11 – 2020-11-20 (×22): 6 [IU] via SUBCUTANEOUS

## 2020-11-11 MED ORDER — LACTATED RINGERS IV SOLN: INTRAVENOUS | Status: DC

## 2020-11-11 MED ORDER — INSULIN ASPART 100 UNIT/ML IJ SOLN
0.0000 [IU] | Freq: Every day | INTRAMUSCULAR | Status: DC
  Administered 2020-11-11 – 2020-11-12 (×2): 2 [IU] via SUBCUTANEOUS
  Administered 2020-11-13: 4 [IU] via SUBCUTANEOUS

## 2020-11-11 MED ORDER — METFORMIN HCL 500 MG PO TABS
1000.0000 mg | ORAL_TABLET | Freq: Every day | ORAL | Status: DC
  Administered 2020-11-11 – 2020-11-19 (×9): 1000 mg via ORAL
  Filled 2020-11-11 (×9): qty 2

## 2020-11-11 MED ORDER — BUPIVACAINE-EPINEPHRINE 0.25% -1:200000 IJ SOLN
INTRAMUSCULAR | Status: DC | PRN
  Administered 2020-11-11: 10 mL

## 2020-11-11 MED ORDER — FENTANYL CITRATE (PF) 100 MCG/2ML IJ SOLN
INTRAMUSCULAR | Status: DC | PRN
  Administered 2020-11-11 (×2): 50 ug via INTRAVENOUS

## 2020-11-11 MED ORDER — ZOLPIDEM TARTRATE 5 MG PO TABS
5.0000 mg | ORAL_TABLET | Freq: Every day | ORAL | Status: DC
  Administered 2020-11-11 – 2020-11-19 (×9): 5 mg via ORAL
  Filled 2020-11-11 (×10): qty 1

## 2020-11-11 MED ORDER — OXYCODONE HCL 5 MG PO TABS: 5.0000 mg | ORAL_TABLET | ORAL | Status: DC | PRN

## 2020-11-11 MED ORDER — PHENYLEPHRINE HCL-NACL 10-0.9 MG/250ML-% IV SOLN
INTRAVENOUS | Status: DC | PRN
  Administered 2020-11-11: 20 ug/min via INTRAVENOUS

## 2020-11-11 MED ORDER — CHLORHEXIDINE GLUCONATE 0.12 % MT SOLN: 15.0000 mL | Freq: Once | OROMUCOSAL | Status: AC

## 2020-11-11 MED ORDER — HYDROMORPHONE HCL 1 MG/ML IJ SOLN: 1.0000 mg | INTRAMUSCULAR | Status: DC | PRN

## 2020-11-11 MED ORDER — INSULIN GLARGINE 100 UNIT/ML ~~LOC~~ SOLN
15.0000 [IU] | Freq: Two times a day (BID) | SUBCUTANEOUS | Status: DC
  Administered 2020-11-11: 15 [IU] via SUBCUTANEOUS
  Filled 2020-11-11 (×2): qty 0.15

## 2020-11-11 MED ORDER — LIDOCAINE 2% (20 MG/ML) 5 ML SYRINGE
INTRAMUSCULAR | Status: AC
  Filled 2020-11-11: qty 15

## 2020-11-11 MED ORDER — SITAGLIPTIN PHOS-METFORMIN HCL 50-1000 MG PO TABS: 1.0000 | ORAL_TABLET | Freq: Every day | ORAL | Status: DC

## 2020-11-11 MED ORDER — DEXAMETHASONE SODIUM PHOSPHATE 10 MG/ML IJ SOLN
INTRAMUSCULAR | Status: DC | PRN
  Administered 2020-11-11: 4 mg via INTRAVENOUS

## 2020-11-11 MED ORDER — PROMETHAZINE HCL 25 MG/ML IJ SOLN: 6.2500 mg | INTRAMUSCULAR | Status: DC | PRN

## 2020-11-11 MED ORDER — ORAL CARE MOUTH RINSE: 15.0000 mL | Freq: Once | OROMUCOSAL | Status: AC

## 2020-11-11 MED ORDER — METOPROLOL SUCCINATE ER 50 MG PO TB24
50.0000 mg | ORAL_TABLET | Freq: Every day | ORAL | Status: DC
  Administered 2020-11-11 – 2020-11-20 (×10): 50 mg via ORAL
  Filled 2020-11-11 (×9): qty 1

## 2020-11-11 MED ORDER — LINAGLIPTIN 5 MG PO TABS
5.0000 mg | ORAL_TABLET | Freq: Every day | ORAL | Status: DC
  Administered 2020-11-11 – 2020-11-19 (×9): 5 mg via ORAL
  Filled 2020-11-11 (×9): qty 1

## 2020-11-11 MED ORDER — LIDOCAINE 2% (20 MG/ML) 5 ML SYRINGE
INTRAMUSCULAR | Status: DC | PRN
  Administered 2020-11-11: 40 mg via INTRAVENOUS

## 2020-11-11 MED ORDER — PROPOFOL 10 MG/ML IV BOLUS
INTRAVENOUS | Status: DC | PRN
  Administered 2020-11-11: 50 mg via INTRAVENOUS

## 2020-11-11 MED ORDER — FENTANYL CITRATE (PF) 250 MCG/5ML IJ SOLN
INTRAMUSCULAR | Status: AC
  Filled 2020-11-11: qty 5

## 2020-11-11 SURGICAL SUPPLY — 42 items
ADH SKN CLS APL DERMABOND .7 (GAUZE/BANDAGES/DRESSINGS) ×1
APPLIER CLIP 9.375 MED OPEN (MISCELLANEOUS)
APR CLP MED 9.3 20 MLT OPN (MISCELLANEOUS)
CHLORAPREP W/TINT 10.5 ML (MISCELLANEOUS) ×2 IMPLANT
CLIP APPLIE 9.375 MED OPEN (MISCELLANEOUS) IMPLANT
CNTNR URN SCR LID CUP LEK RST (MISCELLANEOUS) ×1 IMPLANT
CONT SPEC 4OZ STRL OR WHT (MISCELLANEOUS) ×2
COVER SURGICAL LIGHT HANDLE (MISCELLANEOUS) ×2 IMPLANT
COVER WAND RF STERILE (DRAPES) ×2 IMPLANT
DECANTER SPIKE VIAL GLASS SM (MISCELLANEOUS) ×2 IMPLANT
DERMABOND ADVANCED (GAUZE/BANDAGES/DRESSINGS) ×1
DERMABOND ADVANCED .7 DNX12 (GAUZE/BANDAGES/DRESSINGS) IMPLANT
DRAPE LAPAROTOMY 100X72 PEDS (DRAPES) ×2 IMPLANT
DRAPE UTILITY XL STRL (DRAPES) ×4 IMPLANT
ELECT CAUTERY BLADE 6.4 (BLADE) ×2 IMPLANT
ELECT REM PT RETURN 9FT ADLT (ELECTROSURGICAL) ×2
ELECTRODE REM PT RTRN 9FT ADLT (ELECTROSURGICAL) ×1 IMPLANT
GAUZE 4X4 16PLY RFD (DISPOSABLE) ×2 IMPLANT
GAUZE SPONGE 4X4 12PLY STRL (GAUZE/BANDAGES/DRESSINGS) ×2 IMPLANT
GLOVE SURG ORTHO 8.0 STRL STRW (GLOVE) ×2 IMPLANT
GOWN STRL REUS W/ TWL LRG LVL3 (GOWN DISPOSABLE) ×1 IMPLANT
GOWN STRL REUS W/ TWL XL LVL3 (GOWN DISPOSABLE) ×1 IMPLANT
GOWN STRL REUS W/TWL LRG LVL3 (GOWN DISPOSABLE) ×2
GOWN STRL REUS W/TWL XL LVL3 (GOWN DISPOSABLE) ×2
KIT BASIN OR (CUSTOM PROCEDURE TRAY) ×2 IMPLANT
KIT TURNOVER KIT B (KITS) ×2 IMPLANT
NDL FILTER BLUNT 18X1 1/2 (NEEDLE) IMPLANT
NDL HYPO 25GX1X1/2 BEV (NEEDLE) ×1 IMPLANT
NEEDLE FILTER BLUNT 18X 1/2SAF (NEEDLE)
NEEDLE FILTER BLUNT 18X1 1/2 (NEEDLE) IMPLANT
NEEDLE HYPO 25GX1X1/2 BEV (NEEDLE) ×2 IMPLANT
NS IRRIG 1000ML POUR BTL (IV SOLUTION) ×2 IMPLANT
PACK SURGICAL SETUP 50X90 (CUSTOM PROCEDURE TRAY) ×2 IMPLANT
PAD ARMBOARD 7.5X6 YLW CONV (MISCELLANEOUS) ×2 IMPLANT
PENCIL BUTTON HOLSTER BLD 10FT (ELECTRODE) ×2 IMPLANT
SHEARS HARMONIC 9CM CVD (BLADE) ×1 IMPLANT
STRIP CLOSURE SKIN 1/4X4 (GAUZE/BANDAGES/DRESSINGS) ×2 IMPLANT
SUT MNCRL AB 4-0 PS2 18 (SUTURE) ×2 IMPLANT
SUT VIC AB 3-0 SH 18 (SUTURE) ×2 IMPLANT
SYR CONTROL 10ML LL (SYRINGE) ×2 IMPLANT
TOWEL GREEN STERILE (TOWEL DISPOSABLE) ×2 IMPLANT
TOWEL GREEN STERILE FF (TOWEL DISPOSABLE) ×2 IMPLANT

## 2020-11-11 NOTE — Interval H&P Note (Signed)
History and Physical Interval Note:  11/11/2020 10:05 AM  Kathleen Gibson  has presented today for surgery, with the diagnosis of cirrhosis.  The various methods of treatment have been discussed with the patient and family. After consideration of risks, benefits and other options for treatment, the patient has consented to    Procedure(s): LEFT AXILLARY EXCISIONAL LYMPH NODE BIOPSY (Left) as a surgical intervention.    The patient's history has been reviewed, patient examined, no change in status, stable for surgery.  I have reviewed the patient's chart and labs.  Questions were answered to the patient's satisfaction.    [name], MD [facility] Office: [place]

## 2020-11-11 NOTE — Transfer of Care (Signed)
Immediate Anesthesia Transfer of Care Note  Patient: ALAURA SCHIPPERS  Procedure(s) Performed: LEFT AXILLARY EXCISIONAL LYMPH NODE BIOPSY (Left )  Patient Location: PACU  Anesthesia Type:General  Level of Consciousness: awake and drowsy  Airway & Oxygen Therapy: Patient Spontanous Breathing and Patient connected to nasal cannula oxygen  Post-op Assessment: Report given to RN and Post -op Vital signs reviewed and stable  Post vital signs: Reviewed and stable  Last Vitals:  Vitals Value Taken Time  BP 101/61 11/11/20 1130  Temp 36.1 C 11/11/20 1130  Pulse 88 11/11/20 1134  Resp 48 11/11/20 1134  SpO2 97 % 11/11/20 1134  Vitals shown include unvalidated device data.  Last Pain:  Vitals:   11/11/20 1130  TempSrc:   PainSc: Asleep      Patients Stated Pain Goal: 3 (33/43/56 8616)  Complications: No complications documented.

## 2020-11-11 NOTE — Op Note (Signed)
Operative Note  Pre-operative Diagnosis:  Thrombocytopenia, lymphadenopathy  Post-operative Diagnosis:  same  Surgeon:  [name], MD  Assistant:  none   Procedure:  Left axillary excisional lymph node biopsy  Anesthesia:  General (LMA)  Estimated Blood Loss:  minimal  Drains: none         Specimen: multiple enlarged lymph nodes in saline to pathology  Indications: Patient is a 70 year old female with profound thrombocytopenia and lymphadenopathy.  Hematology/oncology desires excisional lymph node biopsy.  Procedure:  The patient was seen in the pre-op holding area. The risks, benefits, complications, treatment options, and expected outcomes were previously discussed with the patient. The patient agreed with the proposed plan and has signed the informed consent form.  The patient was brought to the operating room by the surgical team, identified as Nancy Fetter and the procedure verified. A "time out" was completed and the above information confirmed.  Following induction of general anesthesia, the patient is positioned and then prepped and draped in the usual aseptic fashion.  After ascertaining that an adequate level of anesthesia been achieved, an incision is made in the low left axilla with a #15 blade.  Dissection was carried through subcutaneous tissues using the electrocautery and the harmonic scalpel.  Enlarged lymph nodes are identified against the chest wall in the low axilla.  These are grasped with an Allis clamp and then excised using the harmonic scalpel to divide tissue and vasculature.  Good hemostasis is noted throughout the procedure.  Multiple enlarged abnormal lymph nodes are excised and submitted to pathology in saline for review.  Good hemostasis is noted throughout the operative field.  Subcutaneous tissues are closed with interrupted 3-0 Vicryl sutures.  Skin is anesthetized with local anesthetic.  Skin edges are reapproximated with a running 4-0 Monocryl  subcuticular suture.  Wound is washed and dried and Dermabond is applied as dressing.  Patient is awakened from anesthesia and transported to the recovery room.  The patient tolerated the procedure well.   [name], MD [facility] Office: [phone]

## 2020-11-11 NOTE — Anesthesia Postprocedure Evaluation (Signed)
Anesthesia Post Note  Patient: Kathleen Gibson  Procedure(s) Performed: LEFT AXILLARY EXCISIONAL LYMPH NODE BIOPSY (Left )     Patient location during evaluation: PACU Anesthesia Type: General Level of consciousness: awake and alert Pain management: pain level controlled Vital Signs Assessment: post-procedure vital signs reviewed and stable Respiratory status: spontaneous breathing, nonlabored ventilation, respiratory function stable and patient connected to nasal cannula oxygen Cardiovascular status: blood pressure returned to baseline and stable Postop Assessment: no apparent nausea or vomiting Anesthetic complications: no   No complications documented.  Last Vitals:  Vitals:   11/11/20 1731 11/11/20 2106  BP: 125/63 111/63  Pulse: 90 80  Resp: 16 17  Temp: 37.2 C 36.8 C  SpO2: 98% 100%    Last Pain:  Vitals:   11/11/20 2106  TempSrc: Oral  PainSc:                  March Rummage Amal Saiki

## 2020-11-11 NOTE — Progress Notes (Signed)
PROGRESS NOTE  Kathleen Gibson:[id number] DOB: 03/03/1951   PCP: [name], DO  Patient is from: Home  DOA: 10/28/2020 LOS: 42  Chief complaints: Low platelet  Brief Narrative / Interim history: 70 year old F with PMH of DM-2 with neuropathy, ITP, gastric bypass, HTN, NAFLD and noncompliance presented to ED with severe thrombocytopenia, and admitted for acute on chronic ITP.  Platelet was 5K.  Treated with platelet transfusion (4 apheresis), IVIG, Nplate and oral prednisone with some initial improvement.   Patient had bone marrow and right axillary lymph node biopsy performed on 5/20. Differentials based on bone marrow biopsy include MPN/MDS versus [name]. Cytogenetics and FISH panel pending.  Unfortunately, right axillary biopsy was non diagnostic.  General surgery consulted for excisional lymph node biopsy but here persistent thrombocytopenia is a barrier.   Platelet is downtrended despite IVIG, Nplate, oral prednisone and multiple platelet transfusions.  Heme-onc following.  Underwent excisional lymph node biopsy of left axillary nodes on 6/1.  Pathology pending.  Subjective: Seen and examined this afternoon after she returned from excisional lymph node biopsy.  Feels well.  No complaints.  Objective: Vitals:   11/11/20 1130 11/11/20 1145 11/11/20 1200 11/11/20 1213  BP: 101/61 106/63 (!) 102/58 (!) 109/59  Pulse: 86 88 82 82  Resp: 15 18 (!) 38 14  Temp: (!) 97 F (36.1 C)  97.8 F (36.6 C) 98.8 F (37.1 C)  TempSrc:    Oral  SpO2: 99% 97% 95% 96%  Weight:      Height:        Intake/Output Summary (Last 24 hours) at 11/11/2020 1520 Last data filed at 11/11/2020 1355 Gross per 24 hour  Intake 684.2 ml  Output 15 ml  Net 669.2 ml   Filed Weights   10/31/20 2044 11/02/20 2111 11/11/20 0942  Weight: 77.3 kg 77.3 kg 77.3 kg    Examination:  GENERAL: No apparent distress.  Nontoxic. HEENT: MMM.  Vision and hearing grossly intact.  NECK: Supple.  No apparent JVD.   RESP: On RA.  No IWOB.  Fair aeration bilaterally. CVS:  RRR. Heart sounds normal.  ABD/GI/GU: BS+. Abd soft, NTND.  MSK/EXT:  Moves extremities. No apparent deformity. No edema.  SKIN: Small bruises over antecubital fossa and right axillary area.  Surgical wound DCI. NEURO: Awake and alert. Oriented appropriately.  No apparent focal neuro deficit. PSYCH: Calm. Normal affect.   Procedures:  5/20-bone marrow and right axillary lymph node biopsy by IR 6/1-excisional lymph node biopsy of left axillary lymph nodes  Microbiology summarized: COVID-19 and influenza PCR nonreactive.  Assessment & Plan: Acute on chronic thrombocytopenia: Baseline 40-90.  Initially improved after IVIG, steroids and platelet transfusion. Now trending down despite multiple platelet transfusions.  Also concern about possible splenic sequestration.  Fortunately, no signs of major bleeding Recent Labs  Lab 11/05/20 0340 11/06/20 0359 11/07/20 0248 11/07/20 0957 11/07/20 2324 11/08/20 0710 11/09/20 0141 11/10/20 0234 11/11/20 0415  PLT 11* 8* 6* 6* 5* 9* 5* 7* 8*  -Continue weekly Nplate per heme-onc -Heme-onc tapering prednisone, on 20 mg -Transfuse for platelets<10K or bleeding. Received 11 apheresis of platelets so far -Heme-onc following  Diffuse lymphadenopathy/leukocytosis: s/p bone marrow and right axillary biopsy. Differentials based on bone marrow biopsy include MPN/MDS versus [name]. Cytogenetics and FISH panel pending.  Unfortunately, right axillary biopsy was non diagnostic. She underwent left axillary lymph node excisional biopsy on 6/1. -Plan for excisional lymph node biopsy on 6/1.  Patient to receive 2 units of platelet perioperatively -  Follow left axillary lymph node biopsy results -Heme-onc following.  Acute blood loss anemia/Normocytic anemia: H&H stable after 1 unit. Recent Labs    11/04/20 0412 11/05/20 0340 11/06/20 0359 11/07/20 0248 11/07/20 0957 11/07/20 2324 11/08/20 0710  11/09/20 0141 11/10/20 0234 11/11/20 0415  HGB 8.6* 8.6* 8.6* 8.7* 9.3* 8.7* 8.9* 8.7* 8.6* 8.2*  -Monitor intermittently  Controlled NIDDM-2 with polyneuropathy, hypoglycemia and hyperglycemia: A1c 5.9%.  Recent Labs  Lab 11/10/20 1144 11/10/20 1654 11/10/20 2234 11/11/20 1128 11/11/20 1215  GLUCAP 212* 244* 210* 101* 155*  -Continue SSI-moderate -NovoLog 6 units 3 times daily with meals -Lantus 15 units in the morning -Further adjustment as appropriate -Continue home gabapentin  Essential hypertension: Normotensive -Continue home metoprolol for now but she could benefit from losartan given diabetes.   History of noncompliance with medical treatment -She has been counseled.  She has been counseled  Leukocytosis/bandemia-likely demargination from steroid  Class I obesity Body mass index is 30.19 kg/m.         DVT prophylaxis:  SCD's Start: 11/11/20 1313  Code Status: DNR/DNI Family Communication: Updated patient's sister at bedside Level of care: Med-Surg Status is: Inpatient  Remains inpatient appropriate because:Ongoing diagnostic testing needed not appropriate for outpatient work up, IV treatments appropriate due to intensity of illness or inability to take PO and Inpatient level of care appropriate due to severity of illness   Dispo: The patient is from: Home              Anticipated d/c is to: Home              Patient currently is not medically stable to d/c.   Difficult to place patient No       Consultants:  Heme-onc General surgery   Sch Meds:  Scheduled Meds: . [START ON 11/12/2020] cholecalciferol  1,000 Units Oral Daily  . gabapentin  800 mg Oral TID  . insulin aspart  0-15 Units Subcutaneous TID WC  . insulin aspart  0-5 Units Subcutaneous QHS  . insulin aspart  6 Units Subcutaneous TID WC  . insulin glargine  15 Units Subcutaneous BID  . linagliptin  5 mg Oral QHS   And  . metFORMIN  1,000 mg Oral QHS  . metoprolol succinate  50  mg Oral Daily  . mirtazapine  15 mg Oral QHS  . predniSONE  20 mg Oral Q breakfast  . zolpidem  5 mg Oral QHS   Continuous Infusions: . sodium chloride 50 mL/hr at 11/11/20 1355   PRN Meds:.acetaminophen **OR** acetaminophen, HYDROmorphone (DILAUDID) injection, ondansetron **OR** ondansetron (ZOFRAN) IV, oxyCODONE, traMADol  Antimicrobials: Anti-infectives (From admission, onward)   Start     Dose/Rate Route Frequency Ordered Stop   11/11/20 0600  ceFAZolin (ANCEF) IVPB 2g/100 mL premix        2 g 200 mL/hr over 30 Minutes Intravenous On call to O.R. 11/10/20 1553 11/11/20 1059       I have personally reviewed the following labs and images: CBC: Recent Labs  Lab 11/07/20 2324 11/08/20 0710 11/09/20 0141 11/10/20 0234 11/11/20 0415  WBC 21.6* 19.8* 21.5* 21.5* 18.6*  HGB 8.7* 8.9* 8.7* 8.6* 8.2*  HCT 28.8* 29.3* 28.7* 28.8* 27.6*  MCV 92.6 92.4 93.8 93.2 92.9  PLT 5* 9* 5* 7* 8*   BMP &GFR Recent Labs  Lab 11/05/20 0340 11/06/20 0359 11/10/20 0234  NA 138 136 137  K 4.1 4.1 4.3  CL 106 108 105  CO2 $Re'28 24 25  'PwW$ GLUCOSE  66* 130* 145*  BUN $Re'19 19 20  'qOU$ CREATININE 0.82 0.72 0.73  CALCIUM 8.6* 8.8* 8.5*  MG 1.9 1.7  --   PHOS 3.1 3.7  --    Estimated Creatinine Clearance: 65.4 mL/min (by C-G formula based on SCr of 0.73 mg/dL). Liver & Pancreas: Recent Labs  Lab 11/05/20 0340 11/06/20 0359  ALBUMIN 2.8* 2.7*   No results for input(s): LIPASE, AMYLASE in the last 168 hours. No results for input(s): AMMONIA in the last 168 hours. Diabetic: No results for input(s): HGBA1C in the last 72 hours. Recent Labs  Lab 11/10/20 1144 11/10/20 1654 11/10/20 2234 11/11/20 1128 11/11/20 1215  GLUCAP 212* 244* 210* 101* 155*   Cardiac Enzymes: No results for input(s): CKTOTAL, CKMB, CKMBINDEX, TROPONINI in the last 168 hours. No results for input(s): PROBNP in the last 8760 hours. Coagulation Profile: No results for input(s): INR, PROTIME in the last 168  hours. Thyroid Function Tests: No results for input(s): TSH, T4TOTAL, FREET4, T3FREE, THYROIDAB in the last 72 hours. Lipid Profile: No results for input(s): CHOL, HDL, LDLCALC, TRIG, CHOLHDL, LDLDIRECT in the last 72 hours. Anemia Panel: No results for input(s): VITAMINB12, FOLATE, FERRITIN, TIBC, IRON, RETICCTPCT in the last 72 hours. Urine analysis:    Component Value Date/Time   COLORURINE YELLOW 09/05/2019 2240   APPEARANCEUR CLEAR 09/05/2019 2240   LABSPEC 1.010 09/05/2019 2240   PHURINE 5.5 09/05/2019 2240   GLUCOSEU NEGATIVE 09/05/2019 2240   HGBUR MODERATE (A) 09/05/2019 2240   BILIRUBINUR NEGATIVE 09/05/2019 2240   KETONESUR NEGATIVE 09/05/2019 2240   PROTEINUR 30 (A) 09/05/2019 2240   NITRITE NEGATIVE 09/05/2019 2240   LEUKOCYTESUR NEGATIVE 09/05/2019 2240   Sepsis Labs: Invalid input(s): PROCALCITONIN, [place]  Microbiology: No results found for this or any previous visit (from the past 240 hour(s)).  Radiology Studies: No results found.    Dago Jungwirth T. [place]  If 7PM-7AM, please contact night-coverage [email or web address] 11/11/2020, 3:20 PM

## 2020-11-11 NOTE — Anesthesia Procedure Notes (Signed)
Procedure Name: LMA Insertion Date/Time: 11/11/2020 10:37 AM Performed by: Renato Shin, CRNA Pre-anesthesia Checklist: Patient identified, Emergency Drugs available, Suction available and Patient being monitored Patient Re-evaluated:Patient Re-evaluated prior to induction Oxygen Delivery Method: Circle system utilized Preoxygenation: Pre-oxygenation with 100% oxygen Induction Type: IV induction LMA: LMA inserted LMA Size: 4.0 Number of attempts: 1 Placement Confirmation: positive ETCO2 and breath sounds checked- equal and bilateral Tube secured with: Tape Dental Injury: Teeth and Oropharynx as per pre-operative assessment

## 2020-11-11 NOTE — Plan of Care (Signed)
  Problem: Education: Goal: Knowledge of General Education information will improve Description: Including pain rating scale, medication(s)/side effects and non-pharmacologic comfort measures Outcome: Progressing   Problem: Activity: Goal: Risk for activity intolerance will decrease Outcome: Progressing   Problem: Coping: Goal: Level of anxiety will decrease Outcome: Progressing   

## 2020-11-12 ENCOUNTER — Encounter (HOSPITAL_COMMUNITY): Payer: Self-pay | Admitting: Surgery

## 2020-11-12 LAB — CBC
HCT: 26 % — ABNORMAL LOW (ref 36.0–46.0)
Hemoglobin: 7.8 g/dL — ABNORMAL LOW (ref 12.0–15.0)
MCH: 28.1 pg (ref 26.0–34.0)
MCHC: 30 g/dL (ref 30.0–36.0)
MCV: 93.5 fL (ref 80.0–100.0)
Platelets: 9 10*3/uL — CL (ref 150–400)
RBC: 2.78 MIL/uL — ABNORMAL LOW (ref 3.87–5.11)
RDW: 18.4 % — ABNORMAL HIGH (ref 11.5–15.5)
WBC: 20.2 10*3/uL — ABNORMAL HIGH (ref 4.0–10.5)
nRBC: 0.2 % (ref 0.0–0.2)

## 2020-11-12 LAB — CBC WITH DIFFERENTIAL/PLATELET
Abs Immature Granulocytes: 0.9 10*3/uL — ABNORMAL HIGH (ref 0.00–0.07)
Basophils Absolute: 0 10*3/uL (ref 0.0–0.1)
Basophils Relative: 0 %
Eosinophils Absolute: 0 10*3/uL (ref 0.0–0.5)
Eosinophils Relative: 0 %
HCT: 28.2 % — ABNORMAL LOW (ref 36.0–46.0)
Hemoglobin: 8.6 g/dL — ABNORMAL LOW (ref 12.0–15.0)
Lymphocytes Relative: 4 %
Lymphs Abs: 0.9 10*3/uL (ref 0.7–4.0)
MCH: 28.3 pg (ref 26.0–34.0)
MCHC: 30.5 g/dL (ref 30.0–36.0)
MCV: 92.8 fL (ref 80.0–100.0)
Metamyelocytes Relative: 2 %
Monocytes Absolute: 3.5 10*3/uL — ABNORMAL HIGH (ref 0.1–1.0)
Monocytes Relative: 15 %
Myelocytes: 2 %
Neutro Abs: 18 10*3/uL — ABNORMAL HIGH (ref 1.7–7.7)
Neutrophils Relative %: 77 %
Platelets: 10 10*3/uL — CL (ref 150–400)
RBC: 3.04 MIL/uL — ABNORMAL LOW (ref 3.87–5.11)
RDW: 18.8 % — ABNORMAL HIGH (ref 11.5–15.5)
WBC: 23.4 10*3/uL — ABNORMAL HIGH (ref 4.0–10.5)
nRBC: 0.2 % (ref 0.0–0.2)
nRBC: 2 /100 WBC — ABNORMAL HIGH

## 2020-11-12 LAB — GLUCOSE, CAPILLARY
Glucose-Capillary: 94 mg/dL (ref 70–99)
Glucose-Capillary: 268 mg/dL — ABNORMAL HIGH (ref 70–99)
Glucose-Capillary: 127 mg/dL — ABNORMAL HIGH (ref 70–99)
Glucose-Capillary: 204 mg/dL — ABNORMAL HIGH (ref 70–99)

## 2020-11-12 MED ORDER — SODIUM CHLORIDE 0.9% IV SOLUTION: Freq: Once | INTRAVENOUS | Status: DC

## 2020-11-12 MED ORDER — PREDNISONE 10 MG PO TABS
10.0000 mg | ORAL_TABLET | Freq: Every day | ORAL | Status: DC
  Administered 2020-11-13 – 2020-11-19 (×7): 10 mg via ORAL
  Filled 2020-11-12 (×7): qty 1

## 2020-11-12 MED ORDER — ROMIPLOSTIM INJECTION 500 MCG
5.0000 ug/kg | Freq: Once | SUBCUTANEOUS | Status: AC
Start: 2020-11-12 — End: 2020-11-12
  Administered 2020-11-12: 385 ug via SUBCUTANEOUS
  Filled 2020-11-12: qty 0.77

## 2020-11-12 NOTE — Plan of Care (Signed)

## 2020-11-12 NOTE — Progress Notes (Signed)
    1 Day Post-Op  Subjective: No new complaints.  Minimally sore under left arm  ROS: See above, otherwise other systems negative  Objective: Vital signs in last 24 hours: Temp:  [97 F (36.1 C)-99.1 F (37.3 C)] 99 F (37.2 C) (06/02 1058) Pulse Rate:  [73-90] 77 (06/02 1058) Resp:  [14-38] 16 (06/02 1058) BP: (101-125)/(58-63) 108/60 (06/02 1058) SpO2:  [95 %-100 %] 96 % (06/02 1058) Last BM Date: 11/11/20  Intake/Output from previous day: 06/01 0701 - 06/02 0700 In: 1217.1 [P.O.:480; I.V.:637.1; IV Piggyback:100] Out: 15 [Blood:15] Intake/Output this shift: No intake/output data recorded.  PE: Skin: Left axilla wound is c/d/i with minimal bruising and no hematoma present.  Minimally tender to palpation  Lab Results:  Recent Labs    11/11/20 0415 11/12/20 0353  WBC 18.6* 20.2*  HGB 8.2* 7.8*  HCT 27.6* 26.0*  PLT 8* 9*   BMET Recent Labs    11/10/20 0234  NA 137  K 4.3  CL 105  CO2 25  GLUCOSE 145*  BUN 20  CREATININE 0.73  CALCIUM 8.5*   PT/INR No results for input(s): LABPROT, INR in the last 72 hours. CMP     Component Value Date/Time   NA 137 11/10/2020 0234   K 4.3 11/10/2020 0234   CL 105 11/10/2020 0234   CO2 25 11/10/2020 0234   GLUCOSE 145 (H) 11/10/2020 0234   BUN 20 11/10/2020 0234   CREATININE 0.73 11/10/2020 0234   CREATININE 1.03 11/20/2017 0924   CALCIUM 8.5 (L) 11/10/2020 0234   PROT 7.8 10/29/2020 0734   ALBUMIN 2.7 (L) 11/06/2020 0359   AST 16 10/29/2020 0734   AST 12 11/20/2017 0924   ALT 9 10/29/2020 0734   ALT 11 11/20/2017 0924   ALKPHOS 40 10/29/2020 0734   BILITOT 0.9 10/29/2020 0734   BILITOT 0.4 11/20/2017 0924   GFRNONAA >60 11/10/2020 0234   GFRNONAA 55 (L) 11/20/2017 0924   GFRAA 45 (L) 09/07/2019 0350   GFRAA >60 11/20/2017 0924   Lipase     Component Value Date/Time   LIPASE 25 07/24/2018 0706       Studies/Results: No results found.  Anti-infectives: Anti-infectives (From admission,  onward)   Start     Dose/Rate Route Frequency Ordered Stop   11/11/20 0600  ceFAZolin (ANCEF) IVPB 2g/100 mL premix        2 g 200 mL/hr over 30 Minutes Intravenous On call to O.R. 11/10/20 1553 11/11/20 1059       Assessment/Plan POD 1, s/p Left axillary excisional LN biopsy for Lymphadenopathy and splenomegaly  -site looks good with no evidence of bleeding.   -path pending -further care per primary and onc. -she may follow up on a prn basis -we will sign off.  FEN - carb mod VTE - on hold due to above ID - none  T2DM with diabetic polyneuropathy HLD HTN pernicious anemia/B12 deficiency NAFLD medical noncompliance Severe thrombocytopenia - has not responded yet to treatment thus far ITP     LOS: 14 days    Henreitta Cea , [facility] 11/12/2020, 11:02 AM Please see Amion for pager number during day hours 7:00am-4:30pm or 7:00am -11:30am on weekends

## 2020-11-12 NOTE — Progress Notes (Signed)
PROGRESS NOTE  Kathleen Gibson IFB:[id number] DOB: 08-31-1950   PCP: Natalia Leatherwood, DO  Patient is from: Home  DOA: 10/28/2020 LOS: 14  Chief complaints: Low platelet  Brief Narrative / Interim history: 70 year old F with PMH of DM-2 with neuropathy, ITP, gastric bypass, HTN, NAFLD and noncompliance presented to ED with severe thrombocytopenia, and admitted for acute on chronic ITP.  Platelet was 5K.  Treated with platelet transfusion (4 apheresis), IVIG, Nplate and oral prednisone with some initial improvement.   Patient had bone marrow and right axillary lymph node biopsy performed on 5/20. Differentials based on bone marrow biopsy include MPN/MDS versus [name]. Cytogenetics and FISH panel pending.  Unfortunately, right axillary biopsy was non diagnostic.  General surgery consulted for excisional lymph node biopsy but here persistent thrombocytopenia is a barrier.   Platelet is downtrended despite IVIG, Nplate, oral prednisone and multiple platelet transfusions.  Heme-onc following.  Underwent excisional lymph node biopsy of left axillary nodes on 6/1.  Pathology pending.  Subjective: Seen and examined earlier this morning.  No major events overnight or this morning.  No complaints.  Minimal pain at biopsy site.  No new signs of bleeding.   Objective: Vitals:   11/11/20 2106 11/12/20 0524 11/12/20 1024 11/12/20 1058  BP: 111/63 (!) 104/58 120/63 108/60  Pulse: 80 73 79 77  Resp: 17 18 16 16   Temp: 98.3 F (36.8 C) 98.1 F (36.7 C) 99.1 F (37.3 C) 99 F (37.2 C)  TempSrc: Oral Oral Oral Oral  SpO2: 100% 99% 98% 96%  Weight:      Height:        Intake/Output Summary (Last 24 hours) at 11/12/2020 1356 Last data filed at 11/12/2020 1000 Gross per 24 hour  Intake 892.85 ml  Output 0 ml  Net 892.85 ml   Filed Weights   10/31/20 2044 11/02/20 2111 11/11/20 0942  Weight: 77.3 kg 77.3 kg 77.3 kg    Examination:  GENERAL: No apparent distress.  Nontoxic. HEENT: MMM.  Vision and  hearing grossly intact.  NECK: Supple.  No apparent JVD.  RESP: On RA.  No IWOB.  Fair aeration bilaterally. CVS:  RRR. Heart sounds normal.  ABD/GI/GU: BS+. Abd soft, NTND.  MSK/EXT:  Moves extremities. No apparent deformity. No edema.  SKIN: Small old bruises over antecubital fossa and right axillary area.  Surgical wound DCI. NEURO: Awake and alert. Oriented appropriately.  No apparent focal neuro deficit. PSYCH: Calm. Normal affect.   Procedures:  5/20-bone marrow and right axillary lymph node biopsy by IR 6/1-excisional lymph node biopsy of left axillary lymph nodes  Microbiology summarized: COVID-19 and influenza PCR nonreactive.  Assessment & Plan: Acute on chronic thrombocytopenia: Baseline 40-90.  Initially improved after IVIG, steroids and platelet transfusion. Now trending down despite multiple platelet transfusions.  Also concern about possible splenic sequestration.  Fortunately, no signs of major bleeding Recent Labs  Lab 11/06/20 0359 11/07/20 0248 11/07/20 0957 11/07/20 2324 11/08/20 0710 11/09/20 0141 11/10/20 0234 11/11/20 0415 11/12/20 0353  PLT 8* 6* 6* 5* 9* 5* 7* 8* 9*  -Continue weekly Nplate per heme-onc -Heme-onc tapering prednisone, on 20 mg -Transfuse for platelets<10K or bleeding. Received 11 apheresis of platelets so far.  1 unit today. -Heme-onc following  Diffuse lymphadenopathy/leukocytosis: s/p bone marrow and right axillary biopsy. Differentials based on bone marrow biopsy include MPN/MDS versus [name]. Cytogenetics and FISH panel pending.  Unfortunately, right axillary biopsy was non diagnostic. She underwent left axillary lymph node excisional biopsy on 6/1. -Follow  left axillary lymph node biopsy results -Heme-onc following.  Acute blood loss anemia/Normocytic anemia: Slow downtrend likely from blood draws. Recent Labs    11/05/20 0340 11/06/20 0359 11/07/20 0248 11/07/20 0957 11/07/20 2324 11/08/20 0710 11/09/20 0141 11/10/20 0234  11/11/20 0415 11/12/20 0353  HGB 8.6* 8.6* 8.7* 9.3* 8.7* 8.9* 8.7* 8.6* 8.2* 7.8*  -Monitor intermittently  Controlled NIDDM-2 with polyneuropathy, hypoglycemia and hyperglycemia: A1c 5.9%.  Recent Labs  Lab 11/11/20 1215 11/11/20 1648 11/11/20 2205 11/12/20 0644 11/12/20 1231  GLUCAP 155* 351* 216* 94 268*  -Continue SSI-moderate -NovoLog 6 units 3 times daily with meals -Lantus 15 units in the morning -Further adjustment as appropriate -Continue home gabapentin  Essential hypertension: Normotensive -Continue home metoprolol for now but she could benefit from losartan given diabetes.   History of noncompliance with medical treatment -She has been counseled.  She has been counseled  Leukocytosis/bandemia-likely demargination from steroid  Class I obesity Body mass index is 30.19 kg/m.         DVT prophylaxis:  SCD's Start: 11/11/20 1313  Code Status: DNR/DNI Family Communication: Updated patient's sister at bedside on 6/1. Level of care: Med-Surg Status is: Inpatient  Remains inpatient appropriate because:Ongoing diagnostic testing needed not appropriate for outpatient work up, IV treatments appropriate due to intensity of illness or inability to take PO and Inpatient level of care appropriate due to severity of illness   Dispo: The patient is from: Home              Anticipated d/c is to: Home              Patient currently is not medically stable to d/c.   Difficult to place patient No       Consultants:  Heme-onc General surgery   Sch Meds:  Scheduled Meds: . sodium chloride   Intravenous Once  . cholecalciferol  1,000 Units Oral Daily  . gabapentin  800 mg Oral TID  . insulin aspart  0-15 Units Subcutaneous TID WC  . insulin aspart  0-5 Units Subcutaneous QHS  . insulin aspart  6 Units Subcutaneous TID WC  . insulin glargine  15 Units Subcutaneous Daily  . linagliptin  5 mg Oral QHS   And  . metFORMIN  1,000 mg Oral QHS  . metoprolol  succinate  50 mg Oral Daily  . mirtazapine  15 mg Oral QHS  . predniSONE  20 mg Oral Q breakfast  . zolpidem  5 mg Oral QHS   Continuous Infusions:  PRN Meds:.acetaminophen **OR** acetaminophen, HYDROmorphone (DILAUDID) injection, ondansetron **OR** ondansetron (ZOFRAN) IV, oxyCODONE, traMADol  Antimicrobials: Anti-infectives (From admission, onward)   Start     Dose/Rate Route Frequency Ordered Stop   11/11/20 0600  ceFAZolin (ANCEF) IVPB 2g/100 mL premix        2 g 200 mL/hr over 30 Minutes Intravenous On call to O.R. 11/10/20 1553 11/11/20 1059       I have personally reviewed the following labs and images: CBC: Recent Labs  Lab 11/08/20 0710 11/09/20 0141 11/10/20 0234 11/11/20 0415 11/12/20 0353  WBC 19.8* 21.5* 21.5* 18.6* 20.2*  HGB 8.9* 8.7* 8.6* 8.2* 7.8*  HCT 29.3* 28.7* 28.8* 27.6* 26.0*  MCV 92.4 93.8 93.2 92.9 93.5  PLT 9* 5* 7* 8* 9*   BMP &GFR Recent Labs  Lab 11/06/20 0359 11/10/20 0234  NA 136 137  K 4.1 4.3  CL 108 105  CO2 24 25  GLUCOSE 130* 145*  BUN 19 20  CREATININE  0.72 0.73  CALCIUM 8.8* 8.5*  MG 1.7  --   PHOS 3.7  --    Estimated Creatinine Clearance: 65.4 mL/min (by C-G formula based on SCr of 0.73 mg/dL). Liver & Pancreas: Recent Labs  Lab 11/06/20 0359  ALBUMIN 2.7*   No results for input(s): LIPASE, AMYLASE in the last 168 hours. No results for input(s): AMMONIA in the last 168 hours. Diabetic: No results for input(s): HGBA1C in the last 72 hours. Recent Labs  Lab 11/11/20 1215 11/11/20 1648 11/11/20 2205 11/12/20 0644 11/12/20 1231  GLUCAP 155* 351* 216* 94 268*   Cardiac Enzymes: No results for input(s): CKTOTAL, CKMB, CKMBINDEX, TROPONINI in the last 168 hours. No results for input(s): PROBNP in the last 8760 hours. Coagulation Profile: No results for input(s): INR, PROTIME in the last 168 hours. Thyroid Function Tests: No results for input(s): TSH, T4TOTAL, FREET4, T3FREE, THYROIDAB in the last 72  hours. Lipid Profile: No results for input(s): CHOL, HDL, LDLCALC, TRIG, CHOLHDL, LDLDIRECT in the last 72 hours. Anemia Panel: No results for input(s): VITAMINB12, FOLATE, FERRITIN, TIBC, IRON, RETICCTPCT in the last 72 hours. Urine analysis:    Component Value Date/Time   COLORURINE YELLOW 09/05/2019 2240   APPEARANCEUR CLEAR 09/05/2019 2240   LABSPEC 1.010 09/05/2019 2240   PHURINE 5.5 09/05/2019 2240   GLUCOSEU NEGATIVE 09/05/2019 2240   HGBUR MODERATE (A) 09/05/2019 2240   BILIRUBINUR NEGATIVE 09/05/2019 2240   KETONESUR NEGATIVE 09/05/2019 2240   PROTEINUR 30 (A) 09/05/2019 2240   NITRITE NEGATIVE 09/05/2019 2240   LEUKOCYTESUR NEGATIVE 09/05/2019 2240   Sepsis Labs: Invalid input(s): PROCALCITONIN, [place]  Microbiology: No results found for this or any previous visit (from the past 240 hour(s)).  Radiology Studies: No results found.    Chea Malan T. [place]  If 7PM-7AM, please contact night-coverage [email or web address] 11/12/2020, 1:56 PM

## 2020-11-13 DIAGNOSIS — D693 Immune thrombocytopenic purpura: Secondary | ICD-10-CM | POA: Diagnosis not present

## 2020-11-13 LAB — GLUCOSE, CAPILLARY
Glucose-Capillary: 81 mg/dL (ref 70–99)
Glucose-Capillary: 214 mg/dL — ABNORMAL HIGH (ref 70–99)
Glucose-Capillary: 123 mg/dL — ABNORMAL HIGH (ref 70–99)
Glucose-Capillary: 348 mg/dL — ABNORMAL HIGH (ref 70–99)

## 2020-11-13 LAB — CBC
HCT: 27.5 % — ABNORMAL LOW (ref 36.0–46.0)
Hemoglobin: 8.4 g/dL — ABNORMAL LOW (ref 12.0–15.0)
MCH: 28.3 pg (ref 26.0–34.0)
MCHC: 30.5 g/dL (ref 30.0–36.0)
MCV: 92.6 fL (ref 80.0–100.0)
Platelets: 8 10*3/uL — CL (ref 150–400)
RBC: 2.97 MIL/uL — ABNORMAL LOW (ref 3.87–5.11)
RDW: 18.6 % — ABNORMAL HIGH (ref 11.5–15.5)
WBC: 18.6 10*3/uL — ABNORMAL HIGH (ref 4.0–10.5)
nRBC: 0.2 % (ref 0.0–0.2)

## 2020-11-13 LAB — SURGICAL PATHOLOGY

## 2020-11-13 NOTE — Plan of Care (Signed)
  Problem: Education: Goal: Knowledge of General Education information will improve Description: Including pain rating scale, medication(s)/side effects and non-pharmacologic comfort measures Outcome: Progressing   Problem: Nutrition: Goal: Adequate nutrition will be maintained Outcome: Progressing   Problem: Coping: Goal: Level of anxiety will decrease Outcome: Progressing   

## 2020-11-13 NOTE — Progress Notes (Signed)
HEMATOLOGY-ONCOLOGY PROGRESS NOTE  SUBJECTIVE: Kathleen Gibson underwent left axillary lymph node surgical biopsy by Dr. Harlow Asa 2 days ago, she tolerated the surgery well, with mild bruising, no hematoma or significant bleeding.  She received platelet transfusion 1 unit yesterday, platelet count remained to be low, 8000 today.  No active bleeding.  She has mild to moderate fatigue, no pain or other new complaints.  She is eating well.  REVIEW OF SYSTEMS:   10 Point review of Systems was done is negative except as noted above.  I have reviewed the past medical history, past surgical history, social history and family history with the patient and they are unchanged from previous note.   PHYSICAL EXAMINATION: ECOG PERFORMANCE STATUS: 2 - Symptomatic, <50% confined to bed  Vitals:   11/13/20 0502 11/13/20 0931  BP: 116/61 118/74  Pulse: 79 98  Resp: 18 16  Temp: 98.2 F (36.8 C) 98 F (36.7 C)  SpO2: 97% 98%   Filed Weights   10/31/20 2044 11/02/20 2111 11/11/20 0942  Weight: 170 lb 6.7 oz (77.3 kg) 170 lb 6.7 oz (77.3 kg) 170 lb 6.7 oz (77.3 kg)    Intake/Output from previous day: 06/02 0701 - 06/03 0700 In: 1080 [P.O.:700; Blood:380] Out: 0    GENERAL:alert, in no acute distress and comfortable SKIN: No rashes, a few scattered ecchymoses on arms EYES: conjunctiva are pink and non-injected, sclera anicteric OROPHARYNX: MMM, no exudates, no oropharyngeal erythema or ulceration LUNGS: clear to auscultation b/l with normal respiratory effort HEART: regular rate & rhythm Extremity: no pedal edema PSYCH: alert & oriented x 3 with fluent speech NEURO: no focal motor/sensory deficits   LABORATORY DATA:  I have reviewed the data as listed CMP Latest Ref Rng & Units 11/10/2020 11/06/2020 11/05/2020  Glucose 70 - 99 mg/dL 145(H) 130(H) 66(L)  BUN 8 - 23 mg/dL $Remove'20 19 19  'ScUIGBt$ Creatinine 0.44 - 1.00 mg/dL 0.73 0.72 0.82  Sodium 135 - 145 mmol/L 137 136 138  Potassium 3.5 - 5.1 mmol/L 4.3 4.1 4.1   Chloride 98 - 111 mmol/L 105 108 106  CO2 22 - 32 mmol/L $RemoveB'25 24 28  'kHyCOdvc$ Calcium 8.9 - 10.3 mg/dL 8.5(L) 8.8(L) 8.6(L)  Total Protein 6.5 - 8.1 g/dL - - -  Total Bilirubin 0.3 - 1.2 mg/dL - - -  Alkaline Phos 38 - 126 U/L - - -  AST 15 - 41 U/L - - -  ALT 0 - 44 U/L - - -    Lab Results  Component Value Date   WBC 18.6 (H) 11/13/2020   HGB 8.4 (L) 11/13/2020   HCT 27.5 (L) 11/13/2020   MCV 92.6 11/13/2020   PLT 8 (LL) 11/13/2020   NEUTROABS 18.0 (H) 11/12/2020    CT ABDOMEN PELVIS WO CONTRAST  Result Date: 10/29/2020 CLINICAL DATA:  Hematologic malignancy, staging. EXAM: CT CHEST, ABDOMEN AND PELVIS WITHOUT CONTRAST TECHNIQUE: Multidetector CT imaging of the chest, abdomen and pelvis was performed following the standard protocol without IV contrast. COMPARISON:  History of MRI diarrhea neck no palpable status chest left at [facility] 7 right-sided than she thickened change ir indications sick concern for insert at the right 6 arm. FINDINGS: CT CHEST FINDINGS Cardiovascular: The aortic and branch vessel atherosclerosis without aneurysmal dilation. Normal size heart. No significant pericardial effusion/thickening. Mediastinum/Nodes: Enlarged supraclavicular, bilateral axillary and left mammary lymph nodes. For reference: Left internal mammary lymph node measures 1.9 by 1.2 cm on image 52/7. Right axillary lymph node measures 3.1 x 2.3 cm on image  36/7. Prominent mediastinal lymph nodes measuring up to 9 mm in short axis on image 39/7, without adenopathy by size criteria. Thyroid is grossly unremarkable. Trachea esophagus are grossly unremarkable. Lungs/Pleura: Right lower lobe calcified granuloma. Bibasilar atelectasis. Scattered bilateral micro nodules measuring 1-2 mm for instance on image 96/4 in the left lower lobe and image 72/4 in the right upper lobe. Musculoskeletal: Peripherally calcified nodule in the right axilla on image 22/3. No acute osseous abnormality. CT ABDOMEN PELVIS FINDINGS  Hepatobiliary: Grossly unremarkable noncontrast appearance of the hepatic parenchyma. No hepatomegaly. Gallbladder is distended with some layering hyperdense material representing sludge and tiny stones seen on prior ultrasound. No biliary ductal dilation. Pancreas: Within normal limits. Spleen: Splenomegaly measuring 22 cm in maximum craniocaudal dimension. Adrenals/Urinary Tract: Adrenal glands are unremarkable. Kidneys are normal, without renal calculi, contour deforming lesion, or hydronephrosis. Bladder is unremarkable. Stomach/Bowel: Prior gastric bypass surgery. Enteric contrast visualized to the level of the distal small bowel. No pathologic dilation of small bowel. Appendix is grossly unremarkable. Colonic diverticulosis without findings of acute diverticulitis. Vascular/Lymphatic: Aortic atherosclerosis without aneurysmal dilation. No significant interval change in the enlarged and prominent upper abdominal, retroperitoneal and mesenteric lymph nodes. Interval increase in size of the few enlarged and prominent pelvic lymph nodes. Reference lymph nodes are as follows: Periportal lymph node measuring 3.3 x 1.7 cm on image 54/3, unchanged. Aortocaval lymph node measures 1.6 x 1.5 cm on image 69/3, unchanged. Right pelvic sidewall lymph node measures 3.1 x 1.6 cm on image 109/3. Reproductive: Uterus and bilateral adnexa are unremarkable. Other: Small volume abdominopelvic ascites with diffuse mesenteric stranding. Multiple peritoneal calcifications are again visualized and unchanged from prior, likely sequela prior inflammation. Similar appearance of the nonspecific anterior abdominal wall subcutaneous calcifications. Mild diffuse subcutaneous edema. Musculoskeletal: Multilevel degenerative changes spine. No acute osseous abnormality. IMPRESSION: 1. Splenomegaly with pathologically enlarged lymph nodes above and below the diaphragm, with overall stable to minimally increased abdominal adenopathy and interval  progression of the pelvic adenopathy. 2. Small volume abdominopelvic ascites with diffuse mesenteric stranding. 3. Scattered bilateral pulmonary micro nodules measuring 1-2 mm. 4. Distended gallbladder with some layering hyperdense material representing layering sludge and tiny stones seen on prior ultrasound. 5. Aortic atherosclerosis. Aortic Atherosclerosis (ICD10-I70.0). Electronically Signed   By: Dahlia Bailiff MD   On: 10/29/2020 19:53   CT CHEST WO CONTRAST  Result Date: 10/29/2020 CLINICAL DATA:  Hematologic malignancy, staging. EXAM: CT CHEST, ABDOMEN AND PELVIS WITHOUT CONTRAST TECHNIQUE: Multidetector CT imaging of the chest, abdomen and pelvis was performed following the standard protocol without IV contrast. COMPARISON:  History of MRI diarrhea neck no palpable status chest left at [facility] 7 right-sided than she thickened change ir indications sick concern for insert at the right 6 arm. FINDINGS: CT CHEST FINDINGS Cardiovascular: The aortic and branch vessel atherosclerosis without aneurysmal dilation. Normal size heart. No significant pericardial effusion/thickening. Mediastinum/Nodes: Enlarged supraclavicular, bilateral axillary and left mammary lymph nodes. For reference: Left internal mammary lymph node measures 1.9 by 1.2 cm on image 52/7. Right axillary lymph node measures 3.1 x 2.3 cm on image 36/7. Prominent mediastinal lymph nodes measuring up to 9 mm in short axis on image 39/7, without adenopathy by size criteria. Thyroid is grossly unremarkable. Trachea esophagus are grossly unremarkable. Lungs/Pleura: Right lower lobe calcified granuloma. Bibasilar atelectasis. Scattered bilateral micro nodules measuring 1-2 mm for instance on image 96/4 in the left lower lobe and image 72/4 in the right upper lobe. Musculoskeletal: Peripherally calcified nodule in the right axilla on  image 22/3. No acute osseous abnormality. CT ABDOMEN PELVIS FINDINGS Hepatobiliary: Grossly unremarkable noncontrast  appearance of the hepatic parenchyma. No hepatomegaly. Gallbladder is distended with some layering hyperdense material representing sludge and tiny stones seen on prior ultrasound. No biliary ductal dilation. Pancreas: Within normal limits. Spleen: Splenomegaly measuring 22 cm in maximum craniocaudal dimension. Adrenals/Urinary Tract: Adrenal glands are unremarkable. Kidneys are normal, without renal calculi, contour deforming lesion, or hydronephrosis. Bladder is unremarkable. Stomach/Bowel: Prior gastric bypass surgery. Enteric contrast visualized to the level of the distal small bowel. No pathologic dilation of small bowel. Appendix is grossly unremarkable. Colonic diverticulosis without findings of acute diverticulitis. Vascular/Lymphatic: Aortic atherosclerosis without aneurysmal dilation. No significant interval change in the enlarged and prominent upper abdominal, retroperitoneal and mesenteric lymph nodes. Interval increase in size of the few enlarged and prominent pelvic lymph nodes. Reference lymph nodes are as follows: Periportal lymph node measuring 3.3 x 1.7 cm on image 54/3, unchanged. Aortocaval lymph node measures 1.6 x 1.5 cm on image 69/3, unchanged. Right pelvic sidewall lymph node measures 3.1 x 1.6 cm on image 109/3. Reproductive: Uterus and bilateral adnexa are unremarkable. Other: Small volume abdominopelvic ascites with diffuse mesenteric stranding. Multiple peritoneal calcifications are again visualized and unchanged from prior, likely sequela prior inflammation. Similar appearance of the nonspecific anterior abdominal wall subcutaneous calcifications. Mild diffuse subcutaneous edema. Musculoskeletal: Multilevel degenerative changes spine. No acute osseous abnormality. IMPRESSION: 1. Splenomegaly with pathologically enlarged lymph nodes above and below the diaphragm, with overall stable to minimally increased abdominal adenopathy and interval progression of the pelvic adenopathy. 2. Small  volume abdominopelvic ascites with diffuse mesenteric stranding. 3. Scattered bilateral pulmonary micro nodules measuring 1-2 mm. 4. Distended gallbladder with some layering hyperdense material representing layering sludge and tiny stones seen on prior ultrasound. 5. Aortic atherosclerosis. Aortic Atherosclerosis (ICD10-I70.0). Electronically Signed   By: Dahlia Bailiff MD   On: 10/29/2020 19:53   CT BIOPSY  Result Date: 10/30/2020 INDICATION: 70 year old female presents for ultrasound-guided right axillary node biopsy EXAM: ULTRASOUND-GUIDED RIGHT AXILLARY NODE BIOPSY MEDICATIONS: None. ANESTHESIA/SEDATION: Moderate (conscious) sedation was employed during this procedure, as the same moderate sedation that was performed during the accession number [id number]. Same sedation time for sequential procedures. A total of Versed 3.0 mg and Fentanyl 125 mcg was administered intravenously. Moderate Sedation Time: 42 minutes. The patient's level of consciousness and vital signs were monitored continuously by radiology nursing throughout the procedure under my direct supervision. FLUOROSCOPY TIME:  ULTRASOUND COMPLICATIONS: NONE PROCEDURE: Informed written consent was obtained from the patient after a thorough discussion of the procedural risks, benefits and alternatives. All questions were addressed. Maximal Sterile Barrier Technique was utilized including caps, mask, sterile gowns, sterile gloves, sterile drape, hand hygiene and skin antiseptic. A timeout was performed prior to the initiation of the procedure. After a bone marrow biopsy was performed under CT guidance, the patient was turned into the supine position on her stretcher. Ultrasound images of the right axillary region were performed with images stored. The right axillary region was prepped with chlorhexidine in a sterile fashion, and a sterile drape was applied covering the operative field. A sterile gown and sterile gloves were used for the procedure.  Local anesthesia was provided with 1% Lidocaine. Ultrasound guidance was used to infiltrate the region with 1% lidocaine for local anesthesia. Five separate 18 gauge core biopsy were then acquired of the right axillary node using ultrasound guidance. Specimens placed into fresh specimen/saline. Patient tolerated the procedure well and remained hemodynamically stable throughout. No  complications were encountered and no significant blood loss was encounter IMPRESSION: Status post ultrasound-guided biopsy of right axillary lymph node. Signed, Dulcy Fanny. Dellia Nims, RPVI [facility] Radiology Electronically Signed   By: Corrie Mckusick D.O.   On: 10/30/2020 11:05   CT BONE MARROW BIOPSY & ASPIRATION  Result Date: 10/30/2020 INDICATION: 70 year old female referred for bone marrow biopsy EXAM: CT BONE MARROW BIOPSY AND ASPIRATION MEDICATIONS: None. ANESTHESIA/SEDATION: Moderate (conscious) sedation was employed during this procedure. A total of Versed 3.0 mg and Fentanyl 125 mcg was administered intravenously. Moderate Sedation Time: 42 minutes. The patient's level of consciousness and vital signs were monitored continuously by radiology nursing throughout the procedure under my direct supervision. FLUOROSCOPY TIME:  CT COMPLICATIONS: None PROCEDURE: The procedure risks, benefits, and alternatives were explained to the patient. Questions regarding the procedure were encouraged and answered. The patient understands and consents to the procedure. Scout CT of the pelvis was performed for surgical planning purposes. The posterior pelvis was prepped with Chlorhexidine in a sterile fashion, and a sterile drape was applied covering the operative field. A sterile gown and sterile gloves were used for the procedure. Local anesthesia was provided with 1% Lidocaine. Posterior right iliac bone was targeted for biopsy. The skin and subcutaneous tissues were infiltrated with 1% lidocaine  without epinephrine. A small stab incision was made with an 11 blade scalpel, and an 11 gauge Murphy needle was advanced with CT guidance to the posterior cortex. Manual forced was used to advance the needle through the posterior cortex and the stylet was removed. A bone marrow aspirate was retrieved and passed to a cytotechnologist in the room. The Murphy needle was then advanced without the stylet for a core biopsy. The core biopsy was retrieved and also passed to a cytotechnologist. Manual pressure was used for hemostasis and a sterile dressing was placed. No complications were encountered no significant blood loss was encountered. Patient tolerated the procedure well and remained hemodynamically stable throughout. After the bone marrow biopsy was completed, the patient was then positioned supine on her stretcher and we proceeded with the next staff of the planned case, a right axillary lymph node ultrasound-guided biopsy. The same sedation time is applied for both procedures. IMPRESSION: Status post CT-guided bone marrow biopsy, with tissue specimen sent to pathology for complete histopathologic analysis Signed, Dulcy Fanny. Earleen Newport, DO [facility] Radiology Electronically Signed   By: Corrie Mckusick D.O.   On: 10/30/2020 11:02   US Abdomen Limited RUQ (LIVER/GB)  Result Date: 10/28/2020 CLINICAL DATA:  Follow-up cirrhosis EXAM: ULTRASOUND ABDOMEN LIMITED RIGHT UPPER QUADRANT COMPARISON:  09/05/2019 FINDINGS: Gallbladder: Gallbladder is well distended with gallbladder sludge within. Previously seen calculi are noted within the dependent sludge. No wall thickening or pericholecystic fluid is noted. Negative sonographic Murphy's sign is elicited. Common bile duct: Diameter: 2.3 mm. Liver: No focal lesion identified. Within normal limits in parenchymal echogenicity. Portal vein is patent on color Doppler imaging with normal direction of blood flow towards the liver.  Other: None. IMPRESSION: Gallbladder sludge and cholelithiasis. No complicating factors are noted. Electronically Signed   By: Inez Catalina M.D.   On: 10/28/2020 23:34    ASSESSMENT AND PLAN:  Kathleen Gibson is a 70 year old female with history of ITP and lymphadenopathy who presented to the hospital with severe thrombocytopenia and anemia.  1. Severe thrombocytopenia, ITP vs MDS/MPN  2. Normocytic anemia 3. Lymphadenopathy and splenomegaly  4. Type 2 diabetes mellitus with polyneuropathy  5.  Hypertension  PLAN -Status post IVIG and prednisone, and Nplate 3 doses. No good response so far  -We will continue tapering of prednisone, dose reduced to 10 mg tomorrow, and wean off in the next 2-3 days  -OK to hold platelet transfusion if platelet count above 5K, without significant bleeding -I spoke with pathologist Dr. Davina Poke today, her lymph node biopsy was negative for lymphoma, likely reactive.  She is still waiting for a few immunostain results. -bone marrow biopsy MPN sequencing panel results is still pending, likely will be back later next week. 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  However her diagnosis has been difficult, especially the exact etiology of severe thrombocytopenia and lymphadenopathy.  I offered referral to [facility] or [facility] specialist, she will think about it.  -she is also not sure if she wants to take chemo if offered.  -will present her case in our hem tumor board next week. -From hematology standpoint, okay to discharge tomorrow. We discussed caution to avoid injury.  I will set up lab and blood transfusion next week, and f/u with me later next week.   Truitt Merle  11/13/2020

## 2020-11-13 NOTE — Progress Notes (Signed)
PROGRESS NOTE  Kathleen Gibson INP:[id number] DOB: 1951/05/16   PCP: Natalia Leatherwood, DO  Patient is from: Home  DOA: 10/28/2020 LOS: 15  Chief complaints: Low platelet  Brief Narrative / Interim history: 70 year old F with PMH of DM-2 with neuropathy, ITP, gastric bypass, HTN, NAFLD and noncompliance presented to ED with severe thrombocytopenia, and admitted for acute on chronic ITP.  Platelet was 5K.  Treated with platelet transfusion (4 apheresis), IVIG, Nplate and oral prednisone with some initial improvement.   Patient had bone marrow and right axillary lymph node biopsy performed on 5/20. Differentials based on bone marrow biopsy include MPN/MDS versus [name]. Cytogenetics and FISH panel pending.  Unfortunately, right axillary biopsy was non diagnostic.  General surgery consulted for excisional lymph node biopsy but here persistent thrombocytopenia is a barrier.   Platelet is downtrended despite IVIG, Nplate, oral prednisone and multiple platelet transfusions.  Heme-onc following.  Underwent excisional lymph node biopsy of left axillary nodes on 6/1.  Pathology pending.  Subjective: Seen and examined earlier this morning.  No major events overnight of this morning.  Unfortunately platelet dropped to 8000 again.  Otherwise a stable.  No signs of bleeding.  Objective: Vitals:   11/12/20 1058 11/12/20 1441 11/13/20 0502 11/13/20 0931  BP: 108/60 (!) 124/56 116/61 118/74  Pulse: 77 85 79 98  Resp: 16 16 18 16   Temp: 99 F (37.2 C) 98.7 F (37.1 C) 98.2 F (36.8 C) 98 F (36.7 C)  TempSrc: Oral Oral Oral   SpO2: 96% 97% 97% 98%  Weight:      Height:        Intake/Output Summary (Last 24 hours) at 11/13/2020 1615 Last data filed at 11/13/2020 1300 Gross per 24 hour  Intake 940 ml  Output --  Net 940 ml   Filed Weights   10/31/20 2044 11/02/20 2111 11/11/20 0942  Weight: 77.3 kg 77.3 kg 77.3 kg    Examination:  GENERAL: No apparent distress.  Nontoxic. HEENT: MMM.  Vision  and hearing grossly intact.  NECK: Supple.  No apparent JVD.  RESP: On RA.  No IWOB.  Fair aeration bilaterally. CVS:  RRR. Heart sounds normal.  ABD/GI/GU: BS+. Abd soft, NTND.  MSK/EXT:  Moves extremities. No apparent deformity. No edema.  SKIN: Small old bruises over antecubital fossa and right axillary area.  Surgical wound DCI. NEURO: Awake and alert. Oriented appropriately.  No apparent focal neuro deficit. PSYCH: Calm. Normal affect.   Procedures:  5/20-bone marrow and right axillary lymph node biopsy by IR 6/1-excisional lymph node biopsy of left axillary lymph nodes  Microbiology summarized: COVID-19 and influenza PCR nonreactive.  Assessment & Plan: Refractory ITP/possible splenic sequestration: Baseline 40-90.  Initially improved after IVIG, steroids and platelet transfusion. Now trending down despite Nplate, steroid and  platelet transfusions. Fortunately, no signs of major bleeding Recent Labs  Lab 11/07/20 0248 11/07/20 0957 11/07/20 2324 11/08/20 0710 11/09/20 0141 11/10/20 0234 11/11/20 0415 11/12/20 0353 11/12/20 1605 11/13/20 0609  PLT 6* 6* 5* 9* 5* 7* 8* 9* 10* 8*  -Continue weekly Nplate per heme-onc -Heme-onc tapering prednisone, on 10 mg 6/3>> -Transfuse for platelets<10K or bleeding. Received 12 apheresis of platelets so far.  Recheck in am -Heme-onc following  Diffuse lymphadenopathy/leukocytosis: s/p bone marrow and right axillary biopsy. Differentials based on bone marrow biopsy include MPN/MDS versus [name].  Unfortunately, right axillary biopsy was non diagnostic. She underwent left axillary lymph node excisional biopsy on 6/1.  Pathology negative for monotypic B-cell or immunophenotypically  aberrant T-cell population but cannot rule out Hodgkin lymphoma, some T-cell lymphomas, metastatic and infectious diseases.  -Cytogenetics and FISH panel pending. -Heme-onc following.   Normocytic anemia: H&H stable. Recent Labs    11/07/20 0248  11/07/20 0957 11/07/20 2324 11/08/20 0710 11/09/20 0141 11/10/20 0234 11/11/20 0415 11/12/20 0353 11/12/20 1605 11/13/20 0609  HGB 8.7* 9.3* 8.7* 8.9* 8.7* 8.6* 8.2* 7.8* 8.6* 8.4*  -Monitor intermittently  Controlled NIDDM-2 with polyneuropathy, hypoglycemia and hyperglycemia: A1c 5.9%.  Recent Labs  Lab 11/12/20 1231 11/12/20 1602 11/12/20 2116 11/13/20 0636 11/13/20 1128  GLUCAP 268* 127* 204* 81 214*  -Continue SSI-moderate -NovoLog 6 units 3 times daily with meals -Lantus 15 units in the morning -Further adjustment as appropriate -Continue home gabapentin  Essential hypertension: Normotensive -Continue home metoprolol for now but she could benefit from losartan given diabetes.   History of noncompliance with medical treatment -She has been counseled.  She has been counseled  Leukocytosis/bandemia-likely demargination from steroid  Class I obesity Body mass index is 30.19 kg/m.         DVT prophylaxis:  SCD's Start: 11/11/20 1313  Code Status: DNR/DNI Family Communication: Patient and RN.  None at bedside. Level of care: Med-Surg Status is: Inpatient  Remains inpatient appropriate because:IV treatments appropriate due to intensity of illness or inability to take PO and Inpatient level of care appropriate due to severity of illness   Dispo: The patient is from: Home              Anticipated d/c is to: Home              Patient currently is not medically stable to d/c.   Difficult to place patient No       Consultants:  Heme-onc General surgery   Sch Meds:  Scheduled Meds: . sodium chloride   Intravenous Once  . cholecalciferol  1,000 Units Oral Daily  . gabapentin  800 mg Oral TID  . insulin aspart  0-15 Units Subcutaneous TID WC  . insulin aspart  0-5 Units Subcutaneous QHS  . insulin aspart  6 Units Subcutaneous TID WC  . insulin glargine  15 Units Subcutaneous Daily  . linagliptin  5 mg Oral QHS   And  . metFORMIN  1,000 mg Oral  QHS  . metoprolol succinate  50 mg Oral Daily  . mirtazapine  15 mg Oral QHS  . predniSONE  10 mg Oral Q breakfast  . zolpidem  5 mg Oral QHS   Continuous Infusions:  PRN Meds:.acetaminophen **OR** acetaminophen, HYDROmorphone (DILAUDID) injection, ondansetron **OR** ondansetron (ZOFRAN) IV, oxyCODONE, traMADol  Antimicrobials: Anti-infectives (From admission, onward)   Start     Dose/Rate Route Frequency Ordered Stop   11/11/20 0600  ceFAZolin (ANCEF) IVPB 2g/100 mL premix        2 g 200 mL/hr over 30 Minutes Intravenous On call to O.R. 11/10/20 1553 11/11/20 1059       I have personally reviewed the following labs and images: CBC: Recent Labs  Lab 11/10/20 0234 11/11/20 0415 11/12/20 0353 11/12/20 1605 11/13/20 0609  WBC 21.5* 18.6* 20.2* 23.4* 18.6*  NEUTROABS  --   --   --  18.0*  --   HGB 8.6* 8.2* 7.8* 8.6* 8.4*  HCT 28.8* 27.6* 26.0* 28.2* 27.5*  MCV 93.2 92.9 93.5 92.8 92.6  PLT 7* 8* 9* 10* 8*   BMP &GFR Recent Labs  Lab 11/10/20 0234  NA 137  K 4.3  CL 105  CO2 25  GLUCOSE 145*  BUN 20  CREATININE 0.73  CALCIUM 8.5*   Estimated Creatinine Clearance: 65.4 mL/min (by C-G formula based on SCr of 0.73 mg/dL). Liver & Pancreas: No results for input(s): AST, ALT, ALKPHOS, BILITOT, PROT, ALBUMIN in the last 168 hours. No results for input(s): LIPASE, AMYLASE in the last 168 hours. No results for input(s): AMMONIA in the last 168 hours. Diabetic: No results for input(s): HGBA1C in the last 72 hours. Recent Labs  Lab 11/12/20 1231 11/12/20 1602 11/12/20 2116 11/13/20 0636 11/13/20 1128  GLUCAP 268* 127* 204* 81 214*   Cardiac Enzymes: No results for input(s): CKTOTAL, CKMB, CKMBINDEX, TROPONINI in the last 168 hours. No results for input(s): PROBNP in the last 8760 hours. Coagulation Profile: No results for input(s): INR, PROTIME in the last 168 hours. Thyroid Function Tests: No results for input(s): TSH, T4TOTAL, FREET4, T3FREE, THYROIDAB in  the last 72 hours. Lipid Profile: No results for input(s): CHOL, HDL, LDLCALC, TRIG, CHOLHDL, LDLDIRECT in the last 72 hours. Anemia Panel: No results for input(s): VITAMINB12, FOLATE, FERRITIN, TIBC, IRON, RETICCTPCT in the last 72 hours. Urine analysis:    Component Value Date/Time   COLORURINE YELLOW 09/05/2019 2240   APPEARANCEUR CLEAR 09/05/2019 2240   LABSPEC 1.010 09/05/2019 2240   PHURINE 5.5 09/05/2019 2240   GLUCOSEU NEGATIVE 09/05/2019 2240   HGBUR MODERATE (A) 09/05/2019 2240   BILIRUBINUR NEGATIVE 09/05/2019 2240   KETONESUR NEGATIVE 09/05/2019 2240   PROTEINUR 30 (A) 09/05/2019 2240   NITRITE NEGATIVE 09/05/2019 2240   LEUKOCYTESUR NEGATIVE 09/05/2019 2240   Sepsis Labs: Invalid input(s): PROCALCITONIN, [place]  Microbiology: No results found for this or any previous visit (from the past 240 hour(s)).  Radiology Studies: No results found.    Mendi Constable T. Hesston  If 7PM-7AM, please contact night-coverage [email or web address] 11/13/2020, 4:15 PM

## 2020-11-14 LAB — CBC
HCT: 28.1 % — ABNORMAL LOW (ref 36.0–46.0)
Hemoglobin: 8.4 g/dL — ABNORMAL LOW (ref 12.0–15.0)
MCH: 28.1 pg (ref 26.0–34.0)
MCHC: 29.9 g/dL — ABNORMAL LOW (ref 30.0–36.0)
MCV: 94 fL (ref 80.0–100.0)
Platelets: 5 10*3/uL — CL (ref 150–400)
RBC: 2.99 MIL/uL — ABNORMAL LOW (ref 3.87–5.11)
RDW: 18.8 % — ABNORMAL HIGH (ref 11.5–15.5)
WBC: 20.3 10*3/uL — ABNORMAL HIGH (ref 4.0–10.5)
nRBC: 0.2 % (ref 0.0–0.2)

## 2020-11-14 LAB — GLUCOSE, CAPILLARY
Glucose-Capillary: 86 mg/dL (ref 70–99)
Glucose-Capillary: 136 mg/dL — ABNORMAL HIGH (ref 70–99)
Glucose-Capillary: 226 mg/dL — ABNORMAL HIGH (ref 70–99)
Glucose-Capillary: 145 mg/dL — ABNORMAL HIGH (ref 70–99)

## 2020-11-14 MED ORDER — SODIUM CHLORIDE 0.9% IV SOLUTION: Freq: Once | INTRAVENOUS | Status: AC

## 2020-11-14 NOTE — Plan of Care (Signed)
  Problem: Clinical Measurements: Goal: Will remain free from infection Outcome: Adequate for Discharge   

## 2020-11-14 NOTE — Progress Notes (Signed)
PROGRESS NOTE  Kathleen Gibson UIV:[id number] DOB: [birth year]   PCP: Natalia Leatherwood, DO  Patient is from: Home  DOA: 10/28/2020 LOS: 16  Chief complaints: Low platelet  Brief Narrative / Interim history: 70 year old F with PMH of DM-2 with neuropathy, ITP, gastric bypass, HTN, NAFLD and noncompliance presented to ED with severe thrombocytopenia, and admitted for acute on chronic ITP.  Platelet was 5K.  Treated with platelet transfusion (4 apheresis), IVIG, Nplate and oral prednisone with some initial improvement.   Patient had bone marrow and right axillary lymph node biopsy performed on 5/20. Differentials based on bone marrow biopsy include MPN/MDS versus [name]. Cytogenetics and FISH panel pending.  Unfortunately, right axillary biopsy was non diagnostic. Underwent excisional lymph node biopsy of left axillary nodes on 6/1.   Platelet is downtrended despite IVIG, Nplate, oral prednisone and multiple platelet transfusions.  Heme-onc following.   Subjective: Seen and examined earlier this morning.  No major events overnight of this morning.  She was hoping to go home this morning after heme-onc told her that she could go home if platelets remains above 5k but platelet dropped from 8k to 5k.  She understands the need to stay and get more platelet transfusion.  She has no complaints.  Very grateful.  Objective: Vitals:   11/14/20 1539 11/14/20 1703 11/14/20 1734 11/14/20 2014  BP: (!) 120/55 116/61 (!) 111/57 (!) 111/55  Pulse: 92 89 93 91  Resp: 18 18 17 18   Temp: 98.7 F (37.1 C) 98.2 F (36.8 C) 98.7 F (37.1 C) 99.3 F (37.4 C)  TempSrc: Oral Oral Oral Oral  SpO2: 99% 99% 98% 99%  Weight:      Height:        Intake/Output Summary (Last 24 hours) at 11/14/2020 2109 Last data filed at 11/14/2020 2000 Gross per 24 hour  Intake 1860 ml  Output 0 ml  Net 1860 ml   Filed Weights   10/31/20 2044 11/02/20 2111 11/11/20 0942  Weight: 77.3 kg 77.3 kg 77.3 kg     Examination:  GENERAL: No apparent distress.  Nontoxic. HEENT: MMM.  Vision and hearing grossly intact.  NECK: Supple.  No apparent JVD.  RESP:  No IWOB.  Fair aeration bilaterally. CVS:  RRR. Heart sounds normal.  ABD/GI/GU: BS+. Abd soft, NTND.  MSK/EXT:  Moves extremities. No apparent deformity. No edema.  SKIN: Small old bruises over antecubital fossa and right axillary area. NEURO: Awake and alert. Oriented appropriately.  No apparent focal neuro deficit. PSYCH: Calm. Normal affect.   Procedures:  5/20-bone marrow and right axillary lymph node biopsy by IR 6/1-excisional lymph node biopsy of left axillary lymph nodes  Microbiology summarized: COVID-19 and influenza PCR nonreactive.  Assessment & Plan: Refractory ITP/possible splenic sequestration: Baseline 40-90.  Initially improved after IVIG, steroids and platelet transfusion. Now trending down despite Nplate, steroid and  platelet transfusions. Fortunately, no signs of major bleeding Recent Labs  Lab 11/07/20 2324 11/08/20 0710 11/09/20 0141 11/10/20 0234 11/11/20 0415 11/12/20 0353 11/12/20 1605 11/13/20 0609 11/14/20 0311  PLT 5* 9* 5* 7* 8* 9* 10* 8* 5*  -Continue weekly Nplate per heme-onc -Heme-onc tapering prednisone, on 10 mg 6/3>> -Transfuse for platelets<10K or bleeding. Received 12 apheresis of platelets so far.  Transfuse 2 units -Appreciate heme-onc recs: 6/3-okay to discharge if platelets >5k.   Diffuse lymphadenopathy/leukocytosis: s/p bone marrow and right axillary biopsy. Differentials based on bone marrow biopsy include MPN/MDS versus [name].  Unfortunately, right axillary biopsy was non diagnostic. She  underwent left axillary lymph node excisional biopsy on 6/1.  Pathology negative for monotypic B-cell or immunophenotypically aberrant T-cell population but cannot rule out Hodgkin lymphoma, some T-cell lymphomas, metastatic and infectious diseases.  -Cytogenetics and FISH panel pending. -Heme-onc  following.   Normocytic anemia: H&H stable. Recent Labs    11/07/20 0957 11/07/20 2324 11/08/20 0710 11/09/20 0141 11/10/20 0234 11/11/20 0415 11/12/20 0353 11/12/20 1605 11/13/20 0609 11/14/20 0311  HGB 9.3* 8.7* 8.9* 8.7* 8.6* 8.2* 7.8* 8.6* 8.4* 8.4*  -Monitor intermittently  Controlled NIDDM-2 with polyneuropathy, hypoglycemia and hyperglycemia: A1c 5.9%.  Recent Labs  Lab 11/13/20 1704 11/13/20 2033 11/14/20 0647 11/14/20 1159 11/14/20 1631  GLUCAP 123* 348* 86 136* 226*  -Continue SSI-moderate -NovoLog 6 units 3 times daily with meals -Lantus 15 units in the morning -Further adjustment as appropriate.  Anticipate improvements and stability and CBG as we wean off steroid -Continue home gabapentin  Essential hypertension: Normotensive -Continue home metoprolol for now but she could benefit from losartan given diabetes.   History of noncompliance with medical treatment -She has been counseled.  She has been counseled  Leukocytosis/bandemia-likely demargination from steroid  Class I obesity Body mass index is 30.19 kg/m.         DVT prophylaxis:  SCD's Start: 11/11/20 1313  Code Status: DNR/DNI Family Communication: Patient and RN.  None at bedside. Level of care: Med-Surg Status is: Inpatient  Remains inpatient appropriate because:IV treatments appropriate due to intensity of illness or inability to take PO and Inpatient level of care appropriate due to severity of illness   Dispo: The patient is from: Home              Anticipated d/c is to: Home              Patient currently is not medically stable to d/c.   Difficult to place patient No       Consultants:  Heme-onc General surgery   Sch Meds:  Scheduled Meds: . sodium chloride   Intravenous Once  . cholecalciferol  1,000 Units Oral Daily  . gabapentin  800 mg Oral TID  . insulin aspart  0-15 Units Subcutaneous TID WC  . insulin aspart  0-5 Units Subcutaneous QHS  . insulin  aspart  6 Units Subcutaneous TID WC  . insulin glargine  15 Units Subcutaneous Daily  . linagliptin  5 mg Oral QHS   And  . metFORMIN  1,000 mg Oral QHS  . metoprolol succinate  50 mg Oral Daily  . mirtazapine  15 mg Oral QHS  . predniSONE  10 mg Oral Q breakfast  . zolpidem  5 mg Oral QHS   Continuous Infusions:  PRN Meds:.acetaminophen **OR** acetaminophen, HYDROmorphone (DILAUDID) injection, ondansetron **OR** ondansetron (ZOFRAN) IV, oxyCODONE, traMADol  Antimicrobials: Anti-infectives (From admission, onward)   Start     Dose/Rate Route Frequency Ordered Stop   11/11/20 0600  ceFAZolin (ANCEF) IVPB 2g/100 mL premix        2 g 200 mL/hr over 30 Minutes Intravenous On call to O.R. 11/10/20 1553 11/11/20 1059       I have personally reviewed the following labs and images: CBC: Recent Labs  Lab 11/11/20 0415 11/12/20 0353 11/12/20 1605 11/13/20 0609 11/14/20 0311  WBC 18.6* 20.2* 23.4* 18.6* 20.3*  NEUTROABS  --   --  18.0*  --   --   HGB 8.2* 7.8* 8.6* 8.4* 8.4*  HCT 27.6* 26.0* 28.2* 27.5* 28.1*  MCV 92.9 93.5 92.8 92.6 94.0  PLT 8* 9* 10* 8* 5*   BMP &GFR Recent Labs  Lab 11/10/20 0234  NA 137  K 4.3  CL 105  CO2 25  GLUCOSE 145*  BUN 20  CREATININE 0.73  CALCIUM 8.5*   Estimated Creatinine Clearance: 65.4 mL/min (by C-G formula based on SCr of 0.73 mg/dL). Liver & Pancreas: No results for input(s): AST, ALT, ALKPHOS, BILITOT, PROT, ALBUMIN in the last 168 hours. No results for input(s): LIPASE, AMYLASE in the last 168 hours. No results for input(s): AMMONIA in the last 168 hours. Diabetic: No results for input(s): HGBA1C in the last 72 hours. Recent Labs  Lab 11/13/20 1704 11/13/20 2033 11/14/20 0647 11/14/20 1159 11/14/20 1631  GLUCAP 123* 348* 86 136* 226*   Cardiac Enzymes: No results for input(s): CKTOTAL, CKMB, CKMBINDEX, TROPONINI in the last 168 hours. No results for input(s): PROBNP in the last 8760 hours. Coagulation Profile: No  results for input(s): INR, PROTIME in the last 168 hours. Thyroid Function Tests: No results for input(s): TSH, T4TOTAL, FREET4, T3FREE, THYROIDAB in the last 72 hours. Lipid Profile: No results for input(s): CHOL, HDL, LDLCALC, TRIG, CHOLHDL, LDLDIRECT in the last 72 hours. Anemia Panel: No results for input(s): VITAMINB12, FOLATE, FERRITIN, TIBC, IRON, RETICCTPCT in the last 72 hours. Urine analysis:    Component Value Date/Time   COLORURINE YELLOW 09/05/2019 2240   APPEARANCEUR CLEAR 09/05/2019 2240   LABSPEC 1.010 09/05/2019 2240   PHURINE 5.5 09/05/2019 2240   GLUCOSEU NEGATIVE 09/05/2019 2240   HGBUR MODERATE (A) 09/05/2019 2240   BILIRUBINUR NEGATIVE 09/05/2019 2240   KETONESUR NEGATIVE 09/05/2019 2240   PROTEINUR 30 (A) 09/05/2019 2240   NITRITE NEGATIVE 09/05/2019 2240   LEUKOCYTESUR NEGATIVE 09/05/2019 2240   Sepsis Labs: Invalid input(s): PROCALCITONIN, [place]  Microbiology: No results found for this or any previous visit (from the past 240 hour(s)).  Radiology Studies: No results found.    Lyndy Russman T. Register  If 7PM-7AM, please contact night-coverage [email or web address] 11/14/2020, 9:09 PM

## 2020-11-15 ENCOUNTER — Inpatient Hospital Stay (HOSPITAL_COMMUNITY): Payer: Medicare HMO

## 2020-11-15 LAB — CBC
HCT: 27.2 % — ABNORMAL LOW (ref 36.0–46.0)
HCT: 28 % — ABNORMAL LOW (ref 36.0–46.0)
HCT: 28.8 % — ABNORMAL LOW (ref 36.0–46.0)
Hemoglobin: 8.4 g/dL — ABNORMAL LOW (ref 12.0–15.0)
Hemoglobin: 8.5 g/dL — ABNORMAL LOW (ref 12.0–15.0)
Hemoglobin: 8.8 g/dL — ABNORMAL LOW (ref 12.0–15.0)
MCH: 28.8 pg (ref 26.0–34.0)
MCH: 28.1 pg (ref 26.0–34.0)
MCH: 28.5 pg (ref 26.0–34.0)
MCHC: 30.9 g/dL (ref 30.0–36.0)
MCHC: 30.4 g/dL (ref 30.0–36.0)
MCHC: 30.6 g/dL (ref 30.0–36.0)
MCV: 93.2 fL (ref 80.0–100.0)
MCV: 92.7 fL (ref 80.0–100.0)
MCV: 93.2 fL (ref 80.0–100.0)
Platelets: <5 10*3/uL — CL (ref 150–400)
Platelets: <5 10*3/uL — CL (ref 150–400)
Platelets: <5 10*3/uL — CL (ref 150–400)
RBC: 2.92 MIL/uL — ABNORMAL LOW (ref 3.87–5.11)
RBC: 3.02 MIL/uL — ABNORMAL LOW (ref 3.87–5.11)
RBC: 3.09 MIL/uL — ABNORMAL LOW (ref 3.87–5.11)
RDW: 18.7 % — ABNORMAL HIGH (ref 11.5–15.5)
RDW: 18.8 % — ABNORMAL HIGH (ref 11.5–15.5)
RDW: 18.8 % — ABNORMAL HIGH (ref 11.5–15.5)
WBC: 19.1 10*3/uL — ABNORMAL HIGH (ref 4.0–10.5)
WBC: 18.8 10*3/uL — ABNORMAL HIGH (ref 4.0–10.5)
WBC: 17.4 10*3/uL — ABNORMAL HIGH (ref 4.0–10.5)
nRBC: 0.3 % — ABNORMAL HIGH (ref 0.0–0.2)
nRBC: 0.2 % (ref 0.0–0.2)
nRBC: 0.2 % (ref 0.0–0.2)

## 2020-11-15 LAB — URINALYSIS, COMPLETE (UACMP) WITH MICROSCOPIC
Bacteria, UA: NONE SEEN
Bilirubin Urine: NEGATIVE
Glucose, UA: NEGATIVE mg/dL
Hgb urine dipstick: NEGATIVE
Ketones, ur: NEGATIVE mg/dL
Leukocytes,Ua: NEGATIVE
Nitrite: NEGATIVE
Protein, ur: NEGATIVE mg/dL
Specific Gravity, Urine: 1.011 (ref 1.005–1.030)
pH: 7 (ref 5.0–8.0)

## 2020-11-15 LAB — BPAM PLATELET PHERESIS
Blood Product Expiration Date: 202206032359
Blood Product Expiration Date: 202206032359
Blood Product Expiration Date: 202206032359
Blood Product Expiration Date: 202206042359
ISSUE DATE / TIME: 202206010608
ISSUE DATE / TIME: 202206021034
ISSUE DATE / TIME: 202206031355
ISSUE DATE / TIME: 202206041116
Unit Type and Rh: 5100
Unit Type and Rh: 5100
Unit Type and Rh: 5100
Unit Type and Rh: 5100

## 2020-11-15 LAB — PREPARE PLATELET PHERESIS
Unit division: 0
Unit division: 0
Unit division: 0
Unit division: 0

## 2020-11-15 LAB — GLUCOSE, CAPILLARY
Glucose-Capillary: 86 mg/dL (ref 70–99)
Glucose-Capillary: 164 mg/dL — ABNORMAL HIGH (ref 70–99)
Glucose-Capillary: 209 mg/dL — ABNORMAL HIGH (ref 70–99)
Glucose-Capillary: 129 mg/dL — ABNORMAL HIGH (ref 70–99)

## 2020-11-15 MED ORDER — SODIUM CHLORIDE 0.9% IV SOLUTION: Freq: Once | INTRAVENOUS | Status: DC

## 2020-11-15 NOTE — Progress Notes (Signed)
   11/15/20 0351  Level of Consciousness  Level of Consciousness Alert  MEWS COLOR  MEWS Score Color Green  Pain Assessment  Pain Scale 0-10  Pain Score 0  MEWS Score  MEWS Temp 0  MEWS Systolic 0  MEWS Pulse 0  MEWS RR 0  MEWS LOC 0  MEWS Score 0  Provider Notification  Provider Name/Title Dr. Cyd Silence  Date Provider Notified 11/15/20  Time Provider Notified [phone]  Notification Type Page  Notification Reason Critical result  Test performed and critical result PLT less than 5  Date Critical Result Received 11/15/20  Time Critical Result Received 0242  Provider response See new orders  Date of Provider Response 11/15/20  Time of Provider Response 0356   Platelets less than 5.  Also, patient now running fever.  Dr. Cyd Silence made aware.  Tylenol given as per order and fever less than 100.5 after Tylenol.  Ok to transfuse 2 Units Platelets.  UA, UA CX, Chest xray, and BC x 2 ordered.  Will continue to monitor patient.  Earleen Reaper RN

## 2020-11-15 NOTE — Progress Notes (Signed)
PROGRESS NOTE    Kathleen Gibson  IAX:[id number] DOB: 09-30-1950 DOA: 10/28/2020 PCP: [name], DO   Brief Narrative:  70 year old F with PMH of DM-2 with neuropathy, ITP, gastric bypass, HTN, NAFLD and noncompliance presented to ED with severe thrombocytopenia, and admitted for acute on chronic ITP.  Platelet was 5K.  Treated with platelet transfusion (4 apheresis), IVIG, Nplate and oral prednisone with some initial improvement.   Patient had bone marrow and right axillary lymph node biopsy performed on 5/20. Differentials based on bone marrow biopsy include MPN/MDS versus [name]. Cytogenetics and FISH panel pending.  Unfortunately, right axillary biopsy was non diagnostic. Underwent excisional lymph node biopsy of left axillary nodes on 6/1.   Platelet is downtrended despite IVIG, Nplate, oral prednisone and multiple platelet transfusions.  Heme-onc following.   Assessment & Plan:   Principal Problem:   Acute ITP (HCC) Active Problems:   Type 2 diabetes mellitus with diabetic polyneuropathy, without long-term current use of insulin (HCC)   Diabetic polyneuropathy associated with type 2 diabetes mellitus (HCC)   Essential hypertension   B12 deficiency   History of noncompliance with medical treatment   Abdominal lymphadenopathy   Anemia   Mixed hyperlipidemia due to type 2 diabetes mellitus (HCC)   Lymphadenopathy   Refractory ITP/possible splenic sequestration: Baseline 40-90.  Initially improved after IVIG, steroids and platelet transfusion. Now trending down despite Nplate, steroid and  platelet transfusions. Fortunately, no signs of bleeding. -Continue weekly Nplate per heme-onc -Heme-onc tapering prednisone, on 10 mg 6/3>> -Transfuse for platelets<10K or bleeding. Received 14 apheresis of platelets so far.  -Appreciate heme-onc recs: 6/3-okay to discharge if platelets >5k.  Patient's platelets dropped to less than 5 again.  2 units of platelets have been ordered by night  hospitalist.  No signs of bleeding.  Diffuse lymphadenopathy/leukocytosis: s/p bone marrow and right axillary biopsy. Differentials based on bone marrow biopsy include MPN/MDS versus [name].  Unfortunately, right axillary biopsy was non diagnostic. She underwent left axillary lymph node excisional biopsy on 6/1.  Pathology negative for monotypic B-cell or immunophenotypically aberrant T-cell population but cannot rule out Hodgkin lymphoma, some T-cell lymphomas, metastatic and infectious diseases.  -Cytogenetics and FISH panel pending. -Heme-onc following.  Per heme-onc note, her biopsy result might come back later this week.  Patient aware as well.  Low-grade fever: Had low-grade fever of 100.9 this morning.  No signs suggestive of any infection.  Chest x-ray and UA negative.  Blood culture done.  Monitor closely.  Normocytic anemia: H&H stable. CBC Latest Ref Rng & Units 11/15/2020 11/15/2020 11/14/2020  WBC 4.0 - 10.5 K/uL 18.8(H) 19.1(H) 20.3(H)  Hemoglobin 12.0 - 15.0 g/dL 8.5(L) 8.4(L) 8.4(L)  Hematocrit 36.0 - 46.0 % 28.0(L) 27.2(L) 28.1(L)  Platelets 150 - 400 K/uL <5(LL) <5(LL) 5(LL)  Monitor intermittently  Controlled NIDDM-2 with polyneuropathy, hypoglycemia and hyperglycemia: A1c 5.9%.  Blood sugar very labile. -Continue SSI-moderate -NovoLog 6 units 3 times daily with meals -Lantus 15 units in the morning -Continue home gabapentin  Essential hypertension: Normotensive -Continue home metoprolol.  History of noncompliance with medical treatment -She has been counseled.   Leukocytosis/bandemia-likely demargination from steroid  Class I obesity Body mass index is 30.19 kg/m.  DVT prophylaxis: SCD's Start: 11/11/20 1313   Code Status: DNR  Family Communication: Daughter present at bedside.  Plan of care discussed with patient in length and he verbalized understanding and agreed with it.  Status is: Inpatient  Remains inpatient appropriate because:Inpatient level of  care appropriate due  to severity of illness   Dispo: The patient is from: Home              Anticipated d/c is to: Home              Patient currently is not medically stable to d/c.   Difficult to place patient No        Estimated body mass index is 30.19 kg/m as calculated from the following:   Height as of this encounter: 5' 3" (1.6 m).   Weight as of this encounter: 77.3 kg.      Nutritional status:               Consultants:   Heme-onc  Procedures:   As mentioned above  Antimicrobials:  Anti-infectives (From admission, onward)   Start     Dose/Rate Route Frequency Ordered Stop   11/11/20 0600  ceFAZolin (ANCEF) IVPB 2g/100 mL premix        2 g 200 mL/hr over 30 Minutes Intravenous On call to O.R. 11/10/20 1553 11/11/20 1059         Subjective: Seen and examined.  Daughter at the bedside.  Patient has no complaint.  Had low-grade fever this morning but denies any dysuria, any urinary problem, any problem bowel movements, any shortness of breath, chills or sweating or any other complaint.  Objective: Vitals:   11/15/20 0503 11/15/20 0614 11/15/20 0653 11/15/20 0930  BP: 121/64 121/62 (!) 100/46 (!) 112/54  Pulse: 93 88 86 94  Resp: _0 Temp: (!) 100.9 F (38.3 C) 99.5 F (37.5 C) 99.5 F (37.5 C) 98 F (36.7 C)  TempSrc: Oral Oral Oral Oral  SpO2: 98% 99% 99% 96%  Weight:      Height:        Intake/Output Summary (Last 24 hours) at 11/15/2020 1127 Last data filed at 11/15/2020 0850 Gross per 24 hour  Intake 2230 ml  Output 300 ml  Net 1930 ml   Filed Weights   10/31/20 2044 11/02/20 2111 11/11/20 0942  Weight: 77.3 kg 77.3 kg 77.3 kg    Examination:  General exam: Appears calm and comfortable  Respiratory system: Clear to auscultation. Respiratory effort normal. Cardiovascular system: S1 & S2 heard, RRR. No JVD, murmurs, rubs, gallops or clicks. No pedal edema. Gastrointestinal system: Abdomen is nondistended, soft and  nontender. No organomegaly or masses felt. Normal bowel sounds heard. Central nervous system: Alert and oriented. No focal neurological deficits. Extremities: Symmetric 5 x 5 power. Skin: No rashes, lesions or ulcers Psychiatry: Judgement and insight appear normal. Mood & affect appropriate.    Data Reviewed: I have personally reviewed following labs and imaging studies  CBC: Recent Labs  Lab 11/12/20 1605 11/13/20 0609 11/14/20 0311 11/15/20 0242 11/15/20 0453  WBC 23.4* 18.6* 20.3* 19.1* 18.8*  NEUTROABS 18.0*  --   --   --   --   HGB 8.6* 8.4* 8.4* 8.4* 8.5*  HCT 28.2* 27.5* 28.1* 27.2* 28.0*  MCV 92.8 92.6 94.0 93.2 92.7  PLT 10* 8* 5* <5* <5*   Basic Metabolic Panel: Recent Labs  Lab 11/10/20 0234  NA 137  K 4.3  CL 105  CO2 25  GLUCOSE 145*  BUN 20  CREATININE 0.73  CALCIUM 8.5*   GFR: Estimated Creatinine Clearance: 65.4 mL/min (by C-G formula based on SCr of 0.73 mg/dL). Liver Function Tests: No results for input(s): AST, ALT, ALKPHOS, BILITOT, PROT, ALBUMIN in the last 168 hours. No results  for input(s): LIPASE, AMYLASE in the last 168 hours. No results for input(s): AMMONIA in the last 168 hours. Coagulation Profile: No results for input(s): INR, PROTIME in the last 168 hours. Cardiac Enzymes: No results for input(s): CKTOTAL, CKMB, CKMBINDEX, TROPONINI in the last 168 hours. BNP (last 3 results) No results for input(s): PROBNP in the last 8760 hours. HbA1C: No results for input(s): HGBA1C in the last 72 hours. CBG: Recent Labs  Lab 11/14/20 0647 11/14/20 1159 11/14/20 1631 11/14/20 2128 11/15/20 0647  GLUCAP 86 136* 226* 145* 86   Lipid Profile: No results for input(s): CHOL, HDL, LDLCALC, TRIG, CHOLHDL, LDLDIRECT in the last 72 hours. Thyroid Function Tests: No results for input(s): TSH, T4TOTAL, FREET4, T3FREE, THYROIDAB in the last 72 hours. Anemia Panel: No results for input(s): VITAMINB12, FOLATE, FERRITIN, TIBC, IRON, RETICCTPCT in  the last 72 hours. Sepsis Labs: No results for input(s): PROCALCITON, LATICACIDVEN in the last 168 hours.  No results found for this or any previous visit (from the past 240 hour(s)).    Radiology Studies: DG Chest 1 View  Result Date: 11/15/2020 CLINICAL DATA:  70 year old female with pneumonia. Hematologic malignancy. EXAM: CHEST  1 VIEW COMPARISON:  Staging CT Chest, Abdomen, and Pelvis 10/29/2020 and earlier. FINDINGS: Portable AP semi upright view at 0549 hours. Lung volumes and mediastinal contours are stable, within normal limits. Visualized tracheal air column is within normal limits. Mild curvilinear atelectasis projecting over the right hemidiaphragm is stable from last month. Elsewhere Allowing for portable technique the lungs are clear. No pleural effusion. No acute osseous abnormality identified. Paucity of bowel gas in the upper abdomen. IMPRESSION: Right lung base atelectasis is stable from the CT last month. No new cardiopulmonary abnormality identified. Electronically Signed   By: Genevie Ann M.D.   On: 11/15/2020 06:02    Scheduled Meds: . sodium chloride   Intravenous Once  . sodium chloride   Intravenous Once  . cholecalciferol  1,000 Units Oral Daily  . gabapentin  800 mg Oral TID  . insulin aspart  0-15 Units Subcutaneous TID WC  . insulin aspart  0-5 Units Subcutaneous QHS  . insulin aspart  6 Units Subcutaneous TID WC  . insulin glargine  15 Units Subcutaneous Daily  . linagliptin  5 mg Oral QHS   And  . metFORMIN  1,000 mg Oral QHS  . metoprolol succinate  50 mg Oral Daily  . mirtazapine  15 mg Oral QHS  . predniSONE  10 mg Oral Q breakfast  . zolpidem  5 mg Oral QHS   Continuous Infusions:   LOS: 17 days   Time spent: 35 minutes   Darliss Cheney, MD [facility]  11/15/2020, 11:27 AM   How to contact the [facility] Attending or Consulting provider [place] or covering provider during after hours [place], for this patient?  1. Check the care team in [facility] and  look for a) attending/consulting [facility] provider listed and b) the [facility] team listed. Page or secure chat [phone]. 2. Log into [email or web address] and use [place]'s universal password to access. If you do not have the password, please contact the hospital operator. 3. Locate the [facility] provider you are looking for under [facility] and page to a number that you can be directly reached. 4. If you still have difficulty reaching the provider, please page the [facility] (Director on Call) for the Hospitalists listed on amion for assistance.

## 2020-11-15 NOTE — Progress Notes (Addendum)
HOSPITAL MEDICINE OVERNIGHT EVENT NOTE    5 by nursing that CBC this morning reveals a platelet count of less than 5.  Per my discussion with nursing patient has exhibited no concurrent evidence of bleeding.  Oncology note reviewed, since patient remains below 5 we will proceed with transfusing patient with 2 packs of platelets.  Will order posttransfusion CBC.  Kathleen Emerald  MD [facility]   ADDENDUM (6/5 5:10AM)  Notified by nursing the patient is exhibiting fever 100.9 F prior to the initiation of the platelet transfusion.  This is unexpected and therefore we will administer Tylenol as well as obtaining a urinalysis, urine culture, blood cultures and chest x-ray.  Once patient has been confirmed to be afebrile we will proceed with platelet transfusion.  We will additionally follow-up on the a forementioned testing above to identify any potential sources of infection.  Kathleen Gibson Kathleen Gibson

## 2020-11-15 NOTE — Plan of Care (Signed)
  Problem: Activity: Goal: Risk for activity intolerance will decrease Outcome: Progressing   

## 2020-11-16 LAB — BPAM PLATELET PHERESIS
Blood Product Expiration Date: 202206062359
Blood Product Expiration Date: 202206062359
Blood Product Expiration Date: 202206082359
Blood Product Expiration Date: 202206092359
ISSUE DATE / TIME: 202206041716
ISSUE DATE / TIME: 202206050632
ISSUE DATE / TIME: 202206051245
Unit Type and Rh: 5100
Unit Type and Rh: 5100
Unit Type and Rh: 5100
Unit Type and Rh: 5100

## 2020-11-16 LAB — BASIC METABOLIC PANEL
Anion gap: 4 — ABNORMAL LOW (ref 5–15)
BUN: 13 mg/dL (ref 8–23)
CO2: 26 mmol/L (ref 22–32)
Calcium: 8.3 mg/dL — ABNORMAL LOW (ref 8.9–10.3)
Chloride: 104 mmol/L (ref 98–111)
Creatinine, Ser: 0.77 mg/dL (ref 0.44–1.00)
GFR, Estimated: >60 mL/min (ref >60)
Glucose, Bld: 154 mg/dL — ABNORMAL HIGH (ref 70–99)
Potassium: 4.2 mmol/L (ref 3.5–5.1)
Sodium: 134 mmol/L — ABNORMAL LOW (ref 135–145)

## 2020-11-16 LAB — GLUCOSE, CAPILLARY
Glucose-Capillary: 114 mg/dL — ABNORMAL HIGH (ref 70–99)
Glucose-Capillary: 133 mg/dL — ABNORMAL HIGH (ref 70–99)
Glucose-Capillary: 343 mg/dL — ABNORMAL HIGH (ref 70–99)
Glucose-Capillary: 77 mg/dL (ref 70–99)
Glucose-Capillary: 184 mg/dL — ABNORMAL HIGH (ref 70–99)

## 2020-11-16 LAB — URINE CULTURE

## 2020-11-16 LAB — CBC
HCT: 27.5 % — ABNORMAL LOW (ref 36.0–46.0)
Hemoglobin: 8.4 g/dL — ABNORMAL LOW (ref 12.0–15.0)
MCH: 28.4 pg (ref 26.0–34.0)
MCHC: 30.5 g/dL (ref 30.0–36.0)
MCV: 92.9 fL (ref 80.0–100.0)
Platelets: <5 10*3/uL — CL (ref 150–400)
RBC: 2.96 MIL/uL — ABNORMAL LOW (ref 3.87–5.11)
RDW: 18.6 % — ABNORMAL HIGH (ref 11.5–15.5)
WBC: 16.8 10*3/uL — ABNORMAL HIGH (ref 4.0–10.5)
nRBC: 0.2 % (ref 0.0–0.2)

## 2020-11-16 LAB — PREPARE PLATELET PHERESIS
Unit division: 0
Unit division: 0
Unit division: 0
Unit division: 0
Unit tag comment: POSITIVE
Unit tag comment: POSITIVE
Unit tag comment: O POS

## 2020-11-16 MED ORDER — IMMUNE GLOBULIN (HUMAN) 10 GM/100ML IV SOLN
2.0000 g/kg | Freq: Once | INTRAVENOUS | Status: AC
  Administered 2020-11-16: 155 g via INTRAVENOUS
  Filled 2020-11-16: qty 800

## 2020-11-16 MED ORDER — SODIUM CHLORIDE 0.9% IV SOLUTION: Freq: Once | INTRAVENOUS | Status: AC

## 2020-11-16 MED ORDER — SODIUM CHLORIDE 0.9% IV SOLUTION: Freq: Once | INTRAVENOUS | Status: DC

## 2020-11-16 NOTE — Progress Notes (Signed)
HOSPITAL MEDICINE OVERNIGHT EVENT NOTE    Current severe thrombocytopenia with platelet count of 5K on labs this morning.  Nursing reports no symptoms, no clinical evidence of bleeding or heavy bruising.  Will order additional 2 packs of platelets to be transfused this morning with posttransfusion CBC.  Vernelle Emerald  MD [facility]

## 2020-11-16 NOTE — Progress Notes (Addendum)
HEMATOLOGY-ONCOLOGY PROGRESS NOTE  SUBJECTIVE: The patient has no specific complaints today.  Denies recurrent fever.  States that she had an area on her right wrist that became reddened and had pus.  The redness was traveling up her arm.  She reports that this looks much better today.  No active bleeding reported.  She received 1 unit of platelets so far today.  REVIEW OF SYSTEMS:   10 Point review of Systems was done is negative except as noted above.  I have reviewed the past medical history, past surgical history, social history and family history with the patient and they are unchanged from previous note.   PHYSICAL EXAMINATION: ECOG PERFORMANCE STATUS: 2 - Symptomatic, <50% confined to bed  Vitals:   11/16/20 1047 11/16/20 1223  BP: (!) 101/50 134/67  Pulse: 80 82  Resp: 18 18  Temp: 97.9 F (36.6 C) 98 F (36.7 C)  SpO2: 97% 99%   Filed Weights   10/31/20 2044 11/02/20 2111 11/11/20 0942  Weight: 77.3 kg 77.3 kg 77.3 kg    Intake/Output from previous day: 06/05 0701 - 06/06 0700 In: 2214 [P.O.:960; I.V.:250; Blood:1004] Out: 3 [Stool:3]   GENERAL:alert, in no acute distress and comfortable SKIN: No rashes, a few scattered ecchymoses on arms EYES: conjunctiva are pink and non-injected, sclera anicteric OROPHARYNX: MMM, no exudates, no oropharyngeal erythema or ulceration LUNGS: clear to auscultation b/l with normal respiratory effort HEART: regular rate & rhythm Extremity: no pedal edema PSYCH: alert & oriented x 3 with fluent speech NEURO: no focal motor/sensory deficits   LABORATORY DATA:  I have reviewed the data as listed CMP Latest Ref Rng & Units 11/10/2020 11/06/2020 11/05/2020  Glucose 70 - 99 mg/dL 145(H) 130(H) 66(L)  BUN 8 - 23 mg/dL $Remove'20 19 19  'IsEvMyN$ Creatinine 0.44 - 1.00 mg/dL 0.73 0.72 0.82  Sodium 135 - 145 mmol/L 137 136 138  Potassium 3.5 - 5.1 mmol/L 4.3 4.1 4.1  Chloride 98 - 111 mmol/L 105 108 106  CO2 22 - 32 mmol/L $RemoveB'25 24 28  'RNQyYxOi$ Calcium 8.9 - 10.3  mg/dL 8.5(L) 8.8(L) 8.6(L)  Total Protein 6.5 - 8.1 g/dL - - -  Total Bilirubin 0.3 - 1.2 mg/dL - - -  Alkaline Phos 38 - 126 U/L - - -  AST 15 - 41 U/L - - -  ALT 0 - 44 U/L - - -    Lab Results  Component Value Date   WBC 17.4 (H) 11/15/2020   HGB 8.8 (L) 11/15/2020   HCT 28.8 (L) 11/15/2020   MCV 93.2 11/15/2020   PLT <5 (LL) 11/15/2020   NEUTROABS 18.0 (H) 11/12/2020    CT ABDOMEN PELVIS WO CONTRAST  Result Date: 10/29/2020 CLINICAL DATA:  Hematologic malignancy, staging. EXAM: CT CHEST, ABDOMEN AND PELVIS WITHOUT CONTRAST TECHNIQUE: Multidetector CT imaging of the chest, abdomen and pelvis was performed following the standard protocol without IV contrast. COMPARISON:  History of MRI diarrhea neck no palpable status chest left at [facility] 7 right-sided than she thickened change ir indications sick concern for insert at the right 6 arm. FINDINGS: CT CHEST FINDINGS Cardiovascular: The aortic and branch vessel atherosclerosis without aneurysmal dilation. Normal size heart. No significant pericardial effusion/thickening. Mediastinum/Nodes: Enlarged supraclavicular, bilateral axillary and left mammary lymph nodes. For reference: Left internal mammary lymph node measures 1.9 by 1.2 cm on image 52/7. Right axillary lymph node measures 3.1 x 2.3 cm on image 36/7. Prominent mediastinal lymph nodes measuring up to 9 mm in short axis on image  39/7, without adenopathy by size criteria. Thyroid is grossly unremarkable. Trachea esophagus are grossly unremarkable. Lungs/Pleura: Right lower lobe calcified granuloma. Bibasilar atelectasis. Scattered bilateral micro nodules measuring 1-2 mm for instance on image 96/4 in the left lower lobe and image 72/4 in the right upper lobe. Musculoskeletal: Peripherally calcified nodule in the right axilla on image 22/3. No acute osseous abnormality. CT ABDOMEN PELVIS FINDINGS Hepatobiliary: Grossly unremarkable noncontrast appearance of the hepatic parenchyma. No  hepatomegaly. Gallbladder is distended with some layering hyperdense material representing sludge and tiny stones seen on prior ultrasound. No biliary ductal dilation. Pancreas: Within normal limits. Spleen: Splenomegaly measuring 22 cm in maximum craniocaudal dimension. Adrenals/Urinary Tract: Adrenal glands are unremarkable. Kidneys are normal, without renal calculi, contour deforming lesion, or hydronephrosis. Bladder is unremarkable. Stomach/Bowel: Prior gastric bypass surgery. Enteric contrast visualized to the level of the distal small bowel. No pathologic dilation of small bowel. Appendix is grossly unremarkable. Colonic diverticulosis without findings of acute diverticulitis. Vascular/Lymphatic: Aortic atherosclerosis without aneurysmal dilation. No significant interval change in the enlarged and prominent upper abdominal, retroperitoneal and mesenteric lymph nodes. Interval increase in size of the few enlarged and prominent pelvic lymph nodes. Reference lymph nodes are as follows: Periportal lymph node measuring 3.3 x 1.7 cm on image 54/3, unchanged. Aortocaval lymph node measures 1.6 x 1.5 cm on image 69/3, unchanged. Right pelvic sidewall lymph node measures 3.1 x 1.6 cm on image 109/3. Reproductive: Uterus and bilateral adnexa are unremarkable. Other: Small volume abdominopelvic ascites with diffuse mesenteric stranding. Multiple peritoneal calcifications are again visualized and unchanged from prior, likely sequela prior inflammation. Similar appearance of the nonspecific anterior abdominal wall subcutaneous calcifications. Mild diffuse subcutaneous edema. Musculoskeletal: Multilevel degenerative changes spine. No acute osseous abnormality. IMPRESSION: 1. Splenomegaly with pathologically enlarged lymph nodes above and below the diaphragm, with overall stable to minimally increased abdominal adenopathy and interval progression of the pelvic adenopathy. 2. Small volume abdominopelvic ascites with diffuse  mesenteric stranding. 3. Scattered bilateral pulmonary micro nodules measuring 1-2 mm. 4. Distended gallbladder with some layering hyperdense material representing layering sludge and tiny stones seen on prior ultrasound. 5. Aortic atherosclerosis. Aortic Atherosclerosis (ICD10-I70.0). Electronically Signed   By: Dahlia Bailiff MD   On: 10/29/2020 19:53   DG Chest 1 View  Result Date: 11/15/2020 CLINICAL DATA:  70 year old female with pneumonia. Hematologic malignancy. EXAM: CHEST  1 VIEW COMPARISON:  Staging CT Chest, Abdomen, and Pelvis 10/29/2020 and earlier. FINDINGS: Portable AP semi upright view at 0549 hours. Lung volumes and mediastinal contours are stable, within normal limits. Visualized tracheal air column is within normal limits. Mild curvilinear atelectasis projecting over the right hemidiaphragm is stable from last month. Elsewhere Allowing for portable technique the lungs are clear. No pleural effusion. No acute osseous abnormality identified. Paucity of bowel gas in the upper abdomen. IMPRESSION: Right lung base atelectasis is stable from the CT last month. No new cardiopulmonary abnormality identified. Electronically Signed   By: Genevie Ann M.D.   On: 11/15/2020 06:02   CT CHEST WO CONTRAST  Result Date: 10/29/2020 CLINICAL DATA:  Hematologic malignancy, staging. EXAM: CT CHEST, ABDOMEN AND PELVIS WITHOUT CONTRAST TECHNIQUE: Multidetector CT imaging of the chest, abdomen and pelvis was performed following the standard protocol without IV contrast. COMPARISON:  History of MRI diarrhea neck no palpable status chest left at [facility] 7 right-sided than she thickened change ir indications sick concern for insert at the right 6 arm. FINDINGS: CT CHEST FINDINGS Cardiovascular: The aortic and branch vessel atherosclerosis without aneurysmal  dilation. Normal size heart. No significant pericardial effusion/thickening. Mediastinum/Nodes: Enlarged supraclavicular, bilateral axillary and left mammary lymph  nodes. For reference: Left internal mammary lymph node measures 1.9 by 1.2 cm on image 52/7. Right axillary lymph node measures 3.1 x 2.3 cm on image 36/7. Prominent mediastinal lymph nodes measuring up to 9 mm in short axis on image 39/7, without adenopathy by size criteria. Thyroid is grossly unremarkable. Trachea esophagus are grossly unremarkable. Lungs/Pleura: Right lower lobe calcified granuloma. Bibasilar atelectasis. Scattered bilateral micro nodules measuring 1-2 mm for instance on image 96/4 in the left lower lobe and image 72/4 in the right upper lobe. Musculoskeletal: Peripherally calcified nodule in the right axilla on image 22/3. No acute osseous abnormality. CT ABDOMEN PELVIS FINDINGS Hepatobiliary: Grossly unremarkable noncontrast appearance of the hepatic parenchyma. No hepatomegaly. Gallbladder is distended with some layering hyperdense material representing sludge and tiny stones seen on prior ultrasound. No biliary ductal dilation. Pancreas: Within normal limits. Spleen: Splenomegaly measuring 22 cm in maximum craniocaudal dimension. Adrenals/Urinary Tract: Adrenal glands are unremarkable. Kidneys are normal, without renal calculi, contour deforming lesion, or hydronephrosis. Bladder is unremarkable. Stomach/Bowel: Prior gastric bypass surgery. Enteric contrast visualized to the level of the distal small bowel. No pathologic dilation of small bowel. Appendix is grossly unremarkable. Colonic diverticulosis without findings of acute diverticulitis. Vascular/Lymphatic: Aortic atherosclerosis without aneurysmal dilation. No significant interval change in the enlarged and prominent upper abdominal, retroperitoneal and mesenteric lymph nodes. Interval increase in size of the few enlarged and prominent pelvic lymph nodes. Reference lymph nodes are as follows: Periportal lymph node measuring 3.3 x 1.7 cm on image 54/3, unchanged. Aortocaval lymph node measures 1.6 x 1.5 cm on image 69/3, unchanged. Right  pelvic sidewall lymph node measures 3.1 x 1.6 cm on image 109/3. Reproductive: Uterus and bilateral adnexa are unremarkable. Other: Small volume abdominopelvic ascites with diffuse mesenteric stranding. Multiple peritoneal calcifications are again visualized and unchanged from prior, likely sequela prior inflammation. Similar appearance of the nonspecific anterior abdominal wall subcutaneous calcifications. Mild diffuse subcutaneous edema. Musculoskeletal: Multilevel degenerative changes spine. No acute osseous abnormality. IMPRESSION: 1. Splenomegaly with pathologically enlarged lymph nodes above and below the diaphragm, with overall stable to minimally increased abdominal adenopathy and interval progression of the pelvic adenopathy. 2. Small volume abdominopelvic ascites with diffuse mesenteric stranding. 3. Scattered bilateral pulmonary micro nodules measuring 1-2 mm. 4. Distended gallbladder with some layering hyperdense material representing layering sludge and tiny stones seen on prior ultrasound. 5. Aortic atherosclerosis. Aortic Atherosclerosis (ICD10-I70.0). Electronically Signed   By: Dahlia Bailiff MD   On: 10/29/2020 19:53   CT BIOPSY  Result Date: 10/30/2020 INDICATION: 70 year old female presents for ultrasound-guided right axillary node biopsy EXAM: ULTRASOUND-GUIDED RIGHT AXILLARY NODE BIOPSY MEDICATIONS: None. ANESTHESIA/SEDATION: Moderate (conscious) sedation was employed during this procedure, as the same moderate sedation that was performed during the accession number [id number]. Same sedation time for sequential procedures. A total of Versed 3.0 mg and Fentanyl 125 mcg was administered intravenously. Moderate Sedation Time: 42 minutes. The patient's level of consciousness and vital signs were monitored continuously by radiology nursing throughout the procedure under my direct supervision. FLUOROSCOPY TIME:  ULTRASOUND COMPLICATIONS: NONE PROCEDURE: Informed written consent was obtained  from the patient after a thorough discussion of the procedural risks, benefits and alternatives. All questions were addressed. Maximal Sterile Barrier Technique was utilized including caps, mask, sterile gowns, sterile gloves, sterile drape, hand hygiene and skin antiseptic. A timeout was performed prior to the initiation of the procedure. After a bone marrow  biopsy was performed under CT guidance, the patient was turned into the supine position on her stretcher. Ultrasound images of the right axillary region were performed with images stored. The right axillary region was prepped with chlorhexidine in a sterile fashion, and a sterile drape was applied covering the operative field. A sterile gown and sterile gloves were used for the procedure. Local anesthesia was provided with 1% Lidocaine. Ultrasound guidance was used to infiltrate the region with 1% lidocaine for local anesthesia. Five separate 18 gauge core biopsy were then acquired of the right axillary node using ultrasound guidance. Specimens placed into fresh specimen/saline. Patient tolerated the procedure well and remained hemodynamically stable throughout. No complications were encountered and no significant blood loss was encounter IMPRESSION: Status post ultrasound-guided biopsy of right axillary lymph node. Signed, Dulcy Fanny. Dellia Nims, RPVI [facility] Electronically Signed   By: Corrie Mckusick D.O.   On: 10/30/2020 11:05   CT BONE MARROW BIOPSY & ASPIRATION  Result Date: 10/30/2020 INDICATION: 70 year old female referred for bone marrow biopsy EXAM: CT BONE MARROW BIOPSY AND ASPIRATION MEDICATIONS: None. ANESTHESIA/SEDATION: Moderate (conscious) sedation was employed during this procedure. A total of Versed 3.0 mg and Fentanyl 125 mcg was administered intravenously. Moderate Sedation Time: 42 minutes. The patient's level of consciousness and vital signs were monitored continuously by  radiology nursing throughout the procedure under my direct supervision. FLUOROSCOPY TIME:  CT COMPLICATIONS: None PROCEDURE: The procedure risks, benefits, and alternatives were explained to the patient. Questions regarding the procedure were encouraged and answered. The patient understands and consents to the procedure. Scout CT of the pelvis was performed for surgical planning purposes. The posterior pelvis was prepped with Chlorhexidine in a sterile fashion, and a sterile drape was applied covering the operative field. A sterile gown and sterile gloves were used for the procedure. Local anesthesia was provided with 1% Lidocaine. Posterior right iliac bone was targeted for biopsy. The skin and subcutaneous tissues were infiltrated with 1% lidocaine without epinephrine. A small stab incision was made with an 11 blade scalpel, and an 11 gauge Murphy needle was advanced with CT guidance to the posterior cortex. Manual forced was used to advance the needle through the posterior cortex and the stylet was removed. A bone marrow aspirate was retrieved and passed to a cytotechnologist in the room. The Murphy needle was then advanced without the stylet for a core biopsy. The core biopsy was retrieved and also passed to a cytotechnologist. Manual pressure was used for hemostasis and a sterile dressing was placed. No complications were encountered no significant blood loss was encountered. Patient tolerated the procedure well and remained hemodynamically stable throughout. After the bone marrow biopsy was completed, the patient was then positioned supine on her stretcher and we proceeded with the next staff of the planned case, a right axillary lymph node ultrasound-guided biopsy. The same sedation time is applied for both procedures. IMPRESSION: Status post CT-guided bone marrow biopsy, with tissue specimen sent to pathology for complete histopathologic analysis Signed, Dulcy Fanny. Earleen Newport, DO [facility] Electronically Signed   By: Corrie Mckusick D.O.   On: 10/30/2020 11:02   US Abdomen Limited RUQ (LIVER/GB)  Result Date: 10/28/2020 CLINICAL DATA:  Follow-up cirrhosis EXAM: ULTRASOUND ABDOMEN LIMITED RIGHT UPPER QUADRANT COMPARISON:  09/05/2019 FINDINGS: Gallbladder: Gallbladder is well distended with gallbladder sludge within. Previously seen calculi are noted within the dependent sludge. No wall thickening or pericholecystic fluid is noted. Negative sonographic  Murphy's sign is elicited. Common bile duct: Diameter: 2.3 mm. Liver: No focal lesion identified. Within normal limits in parenchymal echogenicity. Portal vein is patent on color Doppler imaging with normal direction of blood flow towards the liver. Other: None. IMPRESSION: Gallbladder sludge and cholelithiasis. No complicating factors are noted. Electronically Signed   By: Inez Catalina M.D.   On: 10/28/2020 23:34    ASSESSMENT AND PLAN:  Ms. Walmer is a 70 year old female with history of ITP and lymphadenopathy who presented to the hospital with severe thrombocytopenia and anemia.  1. Severe thrombocytopenia, ITP vs MDS/MPN  2. Normocytic anemia 3. Lymphadenopathy and splenomegaly  4. Type 2 diabetes mellitus with polyneuropathy 5.  Hypertension 6.  ?  Right wrist cellulitis  PLAN -Status post IVIG and prednisone, and Nplate 3 doses.  She has not had a good response to this treatment so far. -She was weaned down to 10 mg of prednisone daily starting on 6/3.  Okay to discontinue in the next 24 to 48 hours. -OK to hold platelet transfusion if platelet count above 5K, without significant bleeding.  If platelets are less than 5000, give 1 unit of platelets.  She will need a post platelet transfusion platelet count.  The repeat platelet count needs to be checked within 10 to 60 minutes of the end of her platelet transfusion. -Lymph node biopsy was negative for lymphoma, likely reactive.  She is  still waiting for a few immunostain results. -bone marrow biopsy MPN sequencing panel results is still pending, likely will be back later this week. 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  However her diagnosis has been difficult, especially the exact etiology of severe thrombocytopenia and lymphadenopathy.  I offered referral to [facility] or [facility] specialist, she will think about it.  -she is also not sure if she wants to take chemo if offered.  -will present her case in our hematology tumor board this week. -From hematology standpoint, okay to discharge.  We discussed caution to avoid injury.  I will set up lab and blood transfusion this week and f/u later this week.   Mikey Bussing  11/16/2020   Addendum  I have seen the patient, examined her. I agree with the assessment and and plan and have edited the notes.   Patient's fever has resolved, possibly related mild skin infection at his IV site of left wrist.  Patient remains to be less than 5K, status post multiple platelet transfusion over the weekend.  Posttransfusion plt was done 2 hours after she completed platelet transfusion (supposedly to be done within 1 hour), and it was still less than 5.  I recommend no more than 1 unit of platelet transfusion every day, she does not respond to platelet transfusion.  Her lymph node biopsy pathology report has not been signout, but we plan to review in hematology tumor board tomorrow morning.  Will discuss rituximab and or spleen radiation for her ITP.  I will follow-up tomorrow.   I discussed the high risk of bleeding when we discharged her with platelet count less than 5K. She is OK to stay in hospital as long as she needs.  Truitt Merle  11/16/2020

## 2020-11-16 NOTE — Progress Notes (Signed)
PROGRESS NOTE    Kathleen Gibson  FBP:[id number] DOB: Jan 04, 1951 DOA: 10/28/2020 PCP: [name], DO   Brief Narrative:  70 year old F with PMH of DM-2 with neuropathy, ITP, gastric bypass, HTN, NAFLD and noncompliance presented to ED with severe thrombocytopenia, and admitted for acute on chronic ITP.  Platelet was 5K.  Treated with platelet transfusion (4 apheresis), IVIG, Nplate and oral prednisone with some initial improvement.   Patient had bone marrow and right axillary lymph node biopsy performed on 5/20. Differentials based on bone marrow biopsy include MPN/MDS versus [name]. Cytogenetics and FISH panel pending.  Unfortunately, right axillary biopsy was non diagnostic. Underwent excisional lymph node biopsy of left axillary nodes on 6/1.   Platelet is downtrended despite IVIG, Nplate, oral prednisone and multiple platelet transfusions.  Heme-onc following.   Assessment & Plan:   Principal Problem:   Acute ITP (HCC) Active Problems:   Type 2 diabetes mellitus with diabetic polyneuropathy, without long-term current use of insulin (HCC)   Diabetic polyneuropathy associated with type 2 diabetes mellitus (HCC)   Essential hypertension   B12 deficiency   History of noncompliance with medical treatment   Abdominal lymphadenopathy   Anemia   Mixed hyperlipidemia due to type 2 diabetes mellitus (HCC)   Lymphadenopathy   Refractory ITP/possible splenic sequestration: Baseline 40-90.  Initially improved after IVIG, steroids and platelet transfusion. Now trending down despite Nplate, steroid and  platelet transfusions. Fortunately, no signs of bleeding. -Continue weekly Nplate per heme-onc -Heme-onc tapering prednisone, on 10 mg 6/3>> -Transfuse for platelets<10K or bleeding. Received 14 apheresis of platelets so far.  -Appreciate heme-onc recs: 6/3-okay to discharge if platelets >5k.  Patient's platelets dropped to less than 5 again last night for which 2 units of platelets were  ordered by night hospitalist.  Repeating CBC today.  No signs of bleeding.  She has so far received about 18 units of platelet transfusion.  Diffuse lymphadenopathy/leukocytosis: s/p bone marrow and right axillary biopsy. Differentials based on bone marrow biopsy include MPN/MDS versus [name].  Unfortunately, right axillary biopsy was non diagnostic. She underwent left axillary lymph node excisional biopsy on 6/1.  Pathology negative for monotypic B-cell or immunophenotypically aberrant T-cell population but cannot rule out Hodgkin lymphoma, some T-cell lymphomas, metastatic and infectious diseases.  -Cytogenetics and FISH panel pending. -Heme-onc following.  Per heme-onc note, her biopsy result might come back later this week.  Patient aware as well.  Low-grade fever: Had low-grade fever of 100.9 on the morning of 11/15/2020.  No signs suggestive of any infection.  Chest x-ray and UA negative.  Blood culture done.  Monitor closely.  Normocytic anemia: H&H stable. CBC Latest Ref Rng & Units 11/15/2020 11/15/2020 11/15/2020  WBC 4.0 - 10.5 K/uL 17.4(H) 18.8(H) 19.1(H)  Hemoglobin 12.0 - 15.0 g/dL 8.8(L) 8.5(L) 8.4(L)  Hematocrit 36.0 - 46.0 % 28.8(L) 28.0(L) 27.2(L)  Platelets 150 - 400 K/uL <5(LL) <5(LL) <5(LL)  Monitor intermittently  Controlled NIDDM-2 with polyneuropathy, hypoglycemia and hyperglycemia: A1c 5.9%.  Blood sugar very labile. -Continue SSI-moderate -NovoLog 6 units 3 times daily with meals -Lantus 15 units in the morning -Continue home gabapentin  Essential hypertension: Normotensive -Continue home metoprolol.  History of noncompliance with medical treatment -She has been counseled.   Leukocytosis/bandemia-likely demargination from steroid  Class I obesity Body mass index is 30.19 kg/m.  DVT prophylaxis: SCD's Start: 11/11/20 1313   Code Status: DNR  Family Communication: None present at bedside.  Plan of care discussed with patient in length and he  verbalized  understanding and agreed with it.  Status is: Inpatient  Remains inpatient appropriate because:Inpatient level of care appropriate due to severity of illness   Dispo: The patient is from: Home              Anticipated d/c is to: Home              Patient currently is not medically stable to d/c.   Difficult to place patient No        Estimated body mass index is 30.19 kg/m as calculated from the following:   Height as of this encounter: $RemoveBeforeD'5\' 3"'ZveCiBOmNSCEHs$  (1.6 m).   Weight as of this encounter: 77.3 kg.      Nutritional status:               Consultants:   Heme-onc  Procedures:   As mentioned above  Antimicrobials:  Anti-infectives (From admission, onward)   Start     Dose/Rate Route Frequency Ordered Stop   11/11/20 0600  ceFAZolin (ANCEF) IVPB 2g/100 mL premix        2 g 200 mL/hr over 30 Minutes Intravenous On call to O.R. 11/10/20 1553 11/11/20 1059         Subjective: Seen and examined.  She states that she feels better than yesterday, no weakness.  No other complaint. Objective: Vitals:   11/16/20 0506 11/16/20 0949 11/16/20 1030 11/16/20 1047  BP: 112/61 (!) 102/37 (!) 106/48 (!) 101/50  Pulse: 81 83 82 80  Resp: $Remo'18 17 18 18  'TLTHN$ Temp: 97.9 F (36.6 C) 98.4 F (36.9 C) 98.1 F (36.7 C) 97.9 F (36.6 C)  TempSrc: Oral Oral Oral   SpO2: 98% 99% 98% 97%  Weight:      Height:        Intake/Output Summary (Last 24 hours) at 11/16/2020 1104 Last data filed at 11/16/2020 0600 Gross per 24 hour  Intake 1589 ml  Output 3 ml  Net 1586 ml   Filed Weights   10/31/20 2044 11/02/20 2111 11/11/20 0942  Weight: 77.3 kg 77.3 kg 77.3 kg    Examination:  General exam: Appears calm and comfortable  Respiratory system: Clear to auscultation. Respiratory effort normal. Cardiovascular system: S1 & S2 heard, RRR. No JVD, murmurs, rubs, gallops or clicks. No pedal edema. Gastrointestinal system: Abdomen is nondistended, soft and nontender. No organomegaly or  masses felt. Normal bowel sounds heard. Central nervous system: Alert and oriented. No focal neurological deficits. Extremities: Symmetric 5 x 5 power. Skin: Couple of old bruises in the upper extremities. Psychiatry: Judgement and insight appear normal. Mood & affect appropriate.    Data Reviewed: I have personally reviewed following labs and imaging studies  CBC: Recent Labs  Lab 11/12/20 1605 11/13/20 0609 11/14/20 0311 11/15/20 0242 11/15/20 0453 11/15/20 1937  WBC 23.4* 18.6* 20.3* 19.1* 18.8* 17.4*  NEUTROABS 18.0*  --   --   --   --   --   HGB 8.6* 8.4* 8.4* 8.4* 8.5* 8.8*  HCT 28.2* 27.5* 28.1* 27.2* 28.0* 28.8*  MCV 92.8 92.6 94.0 93.2 92.7 93.2  PLT 10* 8* 5* <5* <5* <5*   Basic Metabolic Panel: Recent Labs  Lab 11/10/20 0234  NA 137  K 4.3  CL 105  CO2 25  GLUCOSE 145*  BUN 20  CREATININE 0.73  CALCIUM 8.5*   GFR: Estimated Creatinine Clearance: 65.4 mL/min (by C-G formula based on SCr of 0.73 mg/dL). Liver Function Tests: No results for input(s): AST, ALT, ALKPHOS,  BILITOT, PROT, ALBUMIN in the last 168 hours. No results for input(s): LIPASE, AMYLASE in the last 168 hours. No results for input(s): AMMONIA in the last 168 hours. Coagulation Profile: No results for input(s): INR, PROTIME in the last 168 hours. Cardiac Enzymes: No results for input(s): CKTOTAL, CKMB, CKMBINDEX, TROPONINI in the last 168 hours. BNP (last 3 results) No results for input(s): PROBNP in the last 8760 hours. HbA1C: No results for input(s): HGBA1C in the last 72 hours. CBG: Recent Labs  Lab 11/15/20 0647 11/15/20 1200 11/15/20 1636 11/15/20 2119 11/16/20 0645  GLUCAP 86 164* 209* 129* 114*   Lipid Profile: No results for input(s): CHOL, HDL, LDLCALC, TRIG, CHOLHDL, LDLDIRECT in the last 72 hours. Thyroid Function Tests: No results for input(s): TSH, T4TOTAL, FREET4, T3FREE, THYROIDAB in the last 72 hours. Anemia Panel: No results for input(s): VITAMINB12, FOLATE,  FERRITIN, TIBC, IRON, RETICCTPCT in the last 72 hours. Sepsis Labs: No results for input(s): PROCALCITON, LATICACIDVEN in the last 168 hours.  Recent Results (from the past 240 hour(s))  Culture, Urine     Status: Abnormal   Collection Time: 11/15/20  5:08 AM   Specimen: Urine, Random  Result Value Ref Range Status   Specimen Description URINE, RANDOM  Final   Special Requests   Final    NONE Performed at [facility], [address], [place], [place] [address]    Culture MULTIPLE SPECIES PRESENT, SUGGEST RECOLLECTION (A)  Final   Report Status 11/16/2020 FINAL  Final  Culture, blood (routine x 2)     Status: None (Preliminary result)   Collection Time: 11/15/20  5:48 AM   Specimen: BLOOD  Result Value Ref Range Status   Specimen Description BLOOD LEFT ARM  Final   Special Requests   Final    BOTTLES DRAWN AEROBIC AND ANAEROBIC Blood Culture results may not be optimal due to an inadequate volume of blood received in culture bottles   Culture   Final    NO GROWTH 1 DAY Performed at [facility], [place] [address] [place], [place] [address]    Report Status PENDING  Incomplete  Culture, blood (routine x 2)     Status: None (Preliminary result)   Collection Time: 11/15/20  5:48 AM   Specimen: BLOOD  Result Value Ref Range Status   Specimen Description BLOOD RIGHT ARM  Final   Special Requests   Final    BOTTLES DRAWN AEROBIC AND ANAEROBIC Blood Culture adequate volume   Culture   Final    NO GROWTH 1 DAY Performed at [facility], [place] [address], [place], [place] [address]    Report Status PENDING  Incomplete      Radiology Studies: DG Chest 1 View  Result Date: 11/15/2020 CLINICAL DATA:  70 year old female with pneumonia. Hematologic malignancy. EXAM: CHEST  1 VIEW COMPARISON:  Staging CT Chest, Abdomen, and Pelvis 10/29/2020 and earlier. FINDINGS: Portable AP semi upright view at 0549 hours. Lung volumes and mediastinal contours are stable, within normal  limits. Visualized tracheal air column is within normal limits. Mild curvilinear atelectasis projecting over the right hemidiaphragm is stable from last month. Elsewhere Allowing for portable technique the lungs are clear. No pleural effusion. No acute osseous abnormality identified. Paucity of bowel gas in the upper abdomen. IMPRESSION: Right lung base atelectasis is stable from the CT last month. No new cardiopulmonary abnormality identified. Electronically Signed   By: Genevie Ann M.D.   On: 11/15/2020 06:02  Scheduled Meds: . sodium chloride   Intravenous Once  . sodium chloride   Intravenous Once  . cholecalciferol  1,000 Units Oral Daily  . gabapentin  800 mg Oral TID  . insulin aspart  0-15 Units Subcutaneous TID WC  . insulin aspart  0-5 Units Subcutaneous QHS  . insulin aspart  6 Units Subcutaneous TID WC  . insulin glargine  15 Units Subcutaneous Daily  . linagliptin  5 mg Oral QHS   And  . metFORMIN  1,000 mg Oral QHS  . metoprolol succinate  50 mg Oral Daily  . mirtazapine  15 mg Oral QHS  . predniSONE  10 mg Oral Q breakfast  . zolpidem  5 mg Oral QHS   Continuous Infusions:   LOS: 18 days   Time spent: 30 minutes   Darliss Cheney, MD [facility]  11/16/2020, 11:04 AM   How to contact the [facility] Attending or Consulting provider [place] or covering provider during after hours [place], for this patient?  1. Check the care team in [facility] and look for a) attending/consulting [facility] provider listed and b) the [facility] team listed. Page or secure chat [phone]. 2. Log into [email or web address] and use [facility] universal password to access. If you do not have the password, please contact the hospital operator. 3. Locate the [facility] provider you are looking for under [facility] and page to a number that you can be directly reached. 4. If you still have difficulty reaching the provider, please page the [facility] (Director on Call) for the Hospitalists listed on amion for assistance.

## 2020-11-16 NOTE — Progress Notes (Signed)
   11/16/20 0046  Provider Notification  Provider Name/Title Dr. Cyd Silence  Date Provider Notified 11/16/20  Time Provider Notified 0045  Notification Type Page  Notification Reason Critical result  Test performed and critical result PLT less than 5  Date Critical Result Received  (Result never called)  Time Critical Result Received  (Result never called to RN by Lab)  Provider response See new orders  Date of Provider Response 11/16/20  Time of Provider Response 0122   Patient's platelets are less than 5.  Dr. Cyd Silence made aware and order received to transfuse 2 Units of Platelets.  Order implemented.  Will continue to monitor patient.  Earleen Reaper RN

## 2020-11-17 ENCOUNTER — Encounter (HOSPITAL_COMMUNITY): Payer: Self-pay

## 2020-11-17 LAB — BPAM PLATELET PHERESIS
Blood Product Expiration Date: 202206062359
Blood Product Expiration Date: 202206062359
ISSUE DATE / TIME: 202206060252
ISSUE DATE / TIME: 202206061030
Unit Type and Rh: 5100
Unit Type and Rh: 5100

## 2020-11-17 LAB — CBC
HCT: 26.2 % — ABNORMAL LOW (ref 36.0–46.0)
HCT: 29.1 % — ABNORMAL LOW (ref 36.0–46.0)
Hemoglobin: 7.8 g/dL — ABNORMAL LOW (ref 12.0–15.0)
Hemoglobin: 8.6 g/dL — ABNORMAL LOW (ref 12.0–15.0)
MCH: 28.1 pg (ref 26.0–34.0)
MCH: 27.9 pg (ref 26.0–34.0)
MCHC: 29.8 g/dL — ABNORMAL LOW (ref 30.0–36.0)
MCHC: 29.6 g/dL — ABNORMAL LOW (ref 30.0–36.0)
MCV: 94.2 fL (ref 80.0–100.0)
MCV: 94.5 fL (ref 80.0–100.0)
Platelets: <5 10*3/uL — CL (ref 150–400)
Platelets: 9 10*3/uL — CL (ref 150–400)
RBC: 2.78 MIL/uL — ABNORMAL LOW (ref 3.87–5.11)
RBC: 3.08 MIL/uL — ABNORMAL LOW (ref 3.87–5.11)
RDW: 18.8 % — ABNORMAL HIGH (ref 11.5–15.5)
RDW: 18.7 % — ABNORMAL HIGH (ref 11.5–15.5)
WBC: 13.7 10*3/uL — ABNORMAL HIGH (ref 4.0–10.5)
WBC: 16.6 10*3/uL — ABNORMAL HIGH (ref 4.0–10.5)
nRBC: 0.2 % (ref 0.0–0.2)
nRBC: 0.2 % (ref 0.0–0.2)

## 2020-11-17 LAB — PREPARE PLATELET PHERESIS
Unit division: 0
Unit division: 0

## 2020-11-17 LAB — GLUCOSE, CAPILLARY
Glucose-Capillary: 139 mg/dL — ABNORMAL HIGH (ref 70–99)
Glucose-Capillary: 128 mg/dL — ABNORMAL HIGH (ref 70–99)
Glucose-Capillary: 109 mg/dL — ABNORMAL HIGH (ref 70–99)
Glucose-Capillary: 81 mg/dL (ref 70–99)

## 2020-11-17 LAB — SURGICAL PATHOLOGY

## 2020-11-17 MED ORDER — SODIUM CHLORIDE 0.9% IV SOLUTION: Freq: Once | INTRAVENOUS | Status: DC

## 2020-11-17 NOTE — Progress Notes (Addendum)
HEMATOLOGY-ONCOLOGY PROGRESS NOTE  SUBJECTIVE: The patient was sleeping quietly when I arrived.  No family at the bedside.  I did not awaken the patient.  Discussed with nursing who reported that she is not having bleeding.  They have an order for 1 unit of platelets to be given today.  They are still awaiting this from the blood bank.  REVIEW OF SYSTEMS:   10 Point review of Systems was done is negative except as noted above.  I have reviewed the past medical history, past surgical history, social history and family history with the patient and they are unchanged from previous note.   PHYSICAL EXAMINATION: ECOG PERFORMANCE STATUS: 2 - Symptomatic, <50% confined to bed  Vitals:   11/17/20 1245 11/17/20 1304  BP: (!) 126/59 121/61  Pulse: 95 96  Resp: 18 18  Temp: 97.9 F (36.6 C) 98.1 F (36.7 C)  SpO2: 98% 96%   Filed Weights   11/02/20 2111 11/11/20 0942 11/17/20 0400  Weight: 77.3 kg 77.3 kg 77.9 kg    Intake/Output from previous day: 06/06 0701 - 06/07 0700 In: 342 [P.O.:120; Blood:222] Out: -    GENERAL:alert, in no acute distress and comfortable SKIN: No rashes, a few scattered ecchymoses on arms   LABORATORY DATA:  I have reviewed the data as listed CMP Latest Ref Rng & Units 11/16/2020 11/10/2020 11/06/2020  Glucose 70 - 99 mg/dL 154(H) 145(H) 130(H)  BUN 8 - 23 mg/dL $Remove'13 20 19  'LuVaPdi$ Creatinine 0.44 - 1.00 mg/dL 0.77 0.73 0.72  Sodium 135 - 145 mmol/L 134(L) 137 136  Potassium 3.5 - 5.1 mmol/L 4.2 4.3 4.1  Chloride 98 - 111 mmol/L 104 105 108  CO2 22 - 32 mmol/L $RemoveB'26 25 24  'bpgeElxx$ Calcium 8.9 - 10.3 mg/dL 8.3(L) 8.5(L) 8.8(L)  Total Protein 6.5 - 8.1 g/dL - - -  Total Bilirubin 0.3 - 1.2 mg/dL - - -  Alkaline Phos 38 - 126 U/L - - -  AST 15 - 41 U/L - - -  ALT 0 - 44 U/L - - -    Lab Results  Component Value Date   WBC 13.7 (H) 11/17/2020   HGB 7.8 (L) 11/17/2020   HCT 26.2 (L) 11/17/2020   MCV 94.2 11/17/2020   PLT <5 (LL) 11/17/2020   NEUTROABS 18.0 (H)  11/12/2020    CT ABDOMEN PELVIS WO CONTRAST  Result Date: 10/29/2020 CLINICAL DATA:  Hematologic malignancy, staging. EXAM: CT CHEST, ABDOMEN AND PELVIS WITHOUT CONTRAST TECHNIQUE: Multidetector CT imaging of the chest, abdomen and pelvis was performed following the standard protocol without IV contrast. COMPARISON:  History of MRI diarrhea neck no palpable status chest left at [facility] 7 right-sided than she thickened change ir indications sick concern for insert at the right 6 arm. FINDINGS: CT CHEST FINDINGS Cardiovascular: The aortic and branch vessel atherosclerosis without aneurysmal dilation. Normal size heart. No significant pericardial effusion/thickening. Mediastinum/Nodes: Enlarged supraclavicular, bilateral axillary and left mammary lymph nodes. For reference: Left internal mammary lymph node measures 1.9 by 1.2 cm on image 52/7. Right axillary lymph node measures 3.1 x 2.3 cm on image 36/7. Prominent mediastinal lymph nodes measuring up to 9 mm in short axis on image 39/7, without adenopathy by size criteria. Thyroid is grossly unremarkable. Trachea esophagus are grossly unremarkable. Lungs/Pleura: Right lower lobe calcified granuloma. Bibasilar atelectasis. Scattered bilateral micro nodules measuring 1-2 mm for instance on image 96/4 in the left lower lobe and image 72/4 in the right upper lobe. Musculoskeletal: Peripherally calcified nodule in  the right axilla on image 22/3. No acute osseous abnormality. CT ABDOMEN PELVIS FINDINGS Hepatobiliary: Grossly unremarkable noncontrast appearance of the hepatic parenchyma. No hepatomegaly. Gallbladder is distended with some layering hyperdense material representing sludge and tiny stones seen on prior ultrasound. No biliary ductal dilation. Pancreas: Within normal limits. Spleen: Splenomegaly measuring 22 cm in maximum craniocaudal dimension. Adrenals/Urinary Tract: Adrenal glands are unremarkable. Kidneys are normal, without renal calculi, contour  deforming lesion, or hydronephrosis. Bladder is unremarkable. Stomach/Bowel: Prior gastric bypass surgery. Enteric contrast visualized to the level of the distal small bowel. No pathologic dilation of small bowel. Appendix is grossly unremarkable. Colonic diverticulosis without findings of acute diverticulitis. Vascular/Lymphatic: Aortic atherosclerosis without aneurysmal dilation. No significant interval change in the enlarged and prominent upper abdominal, retroperitoneal and mesenteric lymph nodes. Interval increase in size of the few enlarged and prominent pelvic lymph nodes. Reference lymph nodes are as follows: Periportal lymph node measuring 3.3 x 1.7 cm on image 54/3, unchanged. Aortocaval lymph node measures 1.6 x 1.5 cm on image 69/3, unchanged. Right pelvic sidewall lymph node measures 3.1 x 1.6 cm on image 109/3. Reproductive: Uterus and bilateral adnexa are unremarkable. Other: Small volume abdominopelvic ascites with diffuse mesenteric stranding. Multiple peritoneal calcifications are again visualized and unchanged from prior, likely sequela prior inflammation. Similar appearance of the nonspecific anterior abdominal wall subcutaneous calcifications. Mild diffuse subcutaneous edema. Musculoskeletal: Multilevel degenerative changes spine. No acute osseous abnormality. IMPRESSION: 1. Splenomegaly with pathologically enlarged lymph nodes above and below the diaphragm, with overall stable to minimally increased abdominal adenopathy and interval progression of the pelvic adenopathy. 2. Small volume abdominopelvic ascites with diffuse mesenteric stranding. 3. Scattered bilateral pulmonary micro nodules measuring 1-2 mm. 4. Distended gallbladder with some layering hyperdense material representing layering sludge and tiny stones seen on prior ultrasound. 5. Aortic atherosclerosis. Aortic Atherosclerosis (ICD10-I70.0). Electronically Signed   By: Dahlia Bailiff MD   On: 10/29/2020 19:53   DG Chest 1  View  Result Date: 11/15/2020 CLINICAL DATA:  70 year old female with pneumonia. Hematologic malignancy. EXAM: CHEST  1 VIEW COMPARISON:  Staging CT Chest, Abdomen, and Pelvis 10/29/2020 and earlier. FINDINGS: Portable AP semi upright view at 0549 hours. Lung volumes and mediastinal contours are stable, within normal limits. Visualized tracheal air column is within normal limits. Mild curvilinear atelectasis projecting over the right hemidiaphragm is stable from last month. Elsewhere Allowing for portable technique the lungs are clear. No pleural effusion. No acute osseous abnormality identified. Paucity of bowel gas in the upper abdomen. IMPRESSION: Right lung base atelectasis is stable from the CT last month. No new cardiopulmonary abnormality identified. Electronically Signed   By: Genevie Ann M.D.   On: 11/15/2020 06:02   CT CHEST WO CONTRAST  Result Date: 10/29/2020 CLINICAL DATA:  Hematologic malignancy, staging. EXAM: CT CHEST, ABDOMEN AND PELVIS WITHOUT CONTRAST TECHNIQUE: Multidetector CT imaging of the chest, abdomen and pelvis was performed following the standard protocol without IV contrast. COMPARISON:  History of MRI diarrhea neck no palpable status chest left at [facility] 7 right-sided than she thickened change ir indications sick concern for insert at the right 6 arm. FINDINGS: CT CHEST FINDINGS Cardiovascular: The aortic and branch vessel atherosclerosis without aneurysmal dilation. Normal size heart. No significant pericardial effusion/thickening. Mediastinum/Nodes: Enlarged supraclavicular, bilateral axillary and left mammary lymph nodes. For reference: Left internal mammary lymph node measures 1.9 by 1.2 cm on image 52/7. Right axillary lymph node measures 3.1 x 2.3 cm on image 36/7. Prominent mediastinal lymph nodes measuring up to 9  mm in short axis on image 39/7, without adenopathy by size criteria. Thyroid is grossly unremarkable. Trachea esophagus are grossly unremarkable. Lungs/Pleura: Right  lower lobe calcified granuloma. Bibasilar atelectasis. Scattered bilateral micro nodules measuring 1-2 mm for instance on image 96/4 in the left lower lobe and image 72/4 in the right upper lobe. Musculoskeletal: Peripherally calcified nodule in the right axilla on image 22/3. No acute osseous abnormality. CT ABDOMEN PELVIS FINDINGS Hepatobiliary: Grossly unremarkable noncontrast appearance of the hepatic parenchyma. No hepatomegaly. Gallbladder is distended with some layering hyperdense material representing sludge and tiny stones seen on prior ultrasound. No biliary ductal dilation. Pancreas: Within normal limits. Spleen: Splenomegaly measuring 22 cm in maximum craniocaudal dimension. Adrenals/Urinary Tract: Adrenal glands are unremarkable. Kidneys are normal, without renal calculi, contour deforming lesion, or hydronephrosis. Bladder is unremarkable. Stomach/Bowel: Prior gastric bypass surgery. Enteric contrast visualized to the level of the distal small bowel. No pathologic dilation of small bowel. Appendix is grossly unremarkable. Colonic diverticulosis without findings of acute diverticulitis. Vascular/Lymphatic: Aortic atherosclerosis without aneurysmal dilation. No significant interval change in the enlarged and prominent upper abdominal, retroperitoneal and mesenteric lymph nodes. Interval increase in size of the few enlarged and prominent pelvic lymph nodes. Reference lymph nodes are as follows: Periportal lymph node measuring 3.3 x 1.7 cm on image 54/3, unchanged. Aortocaval lymph node measures 1.6 x 1.5 cm on image 69/3, unchanged. Right pelvic sidewall lymph node measures 3.1 x 1.6 cm on image 109/3. Reproductive: Uterus and bilateral adnexa are unremarkable. Other: Small volume abdominopelvic ascites with diffuse mesenteric stranding. Multiple peritoneal calcifications are again visualized and unchanged from prior, likely sequela prior inflammation. Similar appearance of the nonspecific anterior  abdominal wall subcutaneous calcifications. Mild diffuse subcutaneous edema. Musculoskeletal: Multilevel degenerative changes spine. No acute osseous abnormality. IMPRESSION: 1. Splenomegaly with pathologically enlarged lymph nodes above and below the diaphragm, with overall stable to minimally increased abdominal adenopathy and interval progression of the pelvic adenopathy. 2. Small volume abdominopelvic ascites with diffuse mesenteric stranding. 3. Scattered bilateral pulmonary micro nodules measuring 1-2 mm. 4. Distended gallbladder with some layering hyperdense material representing layering sludge and tiny stones seen on prior ultrasound. 5. Aortic atherosclerosis. Aortic Atherosclerosis (ICD10-I70.0). Electronically Signed   By: Dahlia Bailiff MD   On: 10/29/2020 19:53   CT BIOPSY  Result Date: 10/30/2020 INDICATION: 70 year old female presents for ultrasound-guided right axillary node biopsy EXAM: ULTRASOUND-GUIDED RIGHT AXILLARY NODE BIOPSY MEDICATIONS: None. ANESTHESIA/SEDATION: Moderate (conscious) sedation was employed during this procedure, as the same moderate sedation that was performed during the accession number [id number]. Same sedation time for sequential procedures. A total of Versed 3.0 mg and Fentanyl 125 mcg was administered intravenously. Moderate Sedation Time: 42 minutes. The patient's level of consciousness and vital signs were monitored continuously by radiology nursing throughout the procedure under my direct supervision. FLUOROSCOPY TIME:  ULTRASOUND COMPLICATIONS: NONE PROCEDURE: Informed written consent was obtained from the patient after a thorough discussion of the procedural risks, benefits and alternatives. All questions were addressed. Maximal Sterile Barrier Technique was utilized including caps, mask, sterile gowns, sterile gloves, sterile drape, hand hygiene and skin antiseptic. A timeout was performed prior to the initiation of the procedure. After a bone marrow biopsy  was performed under CT guidance, the patient was turned into the supine position on her stretcher. Ultrasound images of the right axillary region were performed with images stored. The right axillary region was prepped with chlorhexidine in a sterile fashion, and a sterile drape was applied covering the operative field. A sterile  gown and sterile gloves were used for the procedure. Local anesthesia was provided with 1% Lidocaine. Ultrasound guidance was used to infiltrate the region with 1% lidocaine for local anesthesia. Five separate 18 gauge core biopsy were then acquired of the right axillary node using ultrasound guidance. Specimens placed into fresh specimen/saline. Patient tolerated the procedure well and remained hemodynamically stable throughout. No complications were encountered and no significant blood loss was encounter IMPRESSION: Status post ultrasound-guided biopsy of right axillary lymph node. Signed, Dulcy Fanny. Dellia Nims, RPVI [facility] Radiology Electronically Signed   By: Corrie Mckusick D.O.   On: 10/30/2020 11:05   CT BONE MARROW BIOPSY & ASPIRATION  Result Date: 10/30/2020 INDICATION: 70 year old female referred for bone marrow biopsy EXAM: CT BONE MARROW BIOPSY AND ASPIRATION MEDICATIONS: None. ANESTHESIA/SEDATION: Moderate (conscious) sedation was employed during this procedure. A total of Versed 3.0 mg and Fentanyl 125 mcg was administered intravenously. Moderate Sedation Time: 42 minutes. The patient's level of consciousness and vital signs were monitored continuously by radiology nursing throughout the procedure under my direct supervision. FLUOROSCOPY TIME:  CT COMPLICATIONS: None PROCEDURE: The procedure risks, benefits, and alternatives were explained to the patient. Questions regarding the procedure were encouraged and answered. The patient understands and consents to the procedure. Scout CT of the pelvis was performed for surgical  planning purposes. The posterior pelvis was prepped with Chlorhexidine in a sterile fashion, and a sterile drape was applied covering the operative field. A sterile gown and sterile gloves were used for the procedure. Local anesthesia was provided with 1% Lidocaine. Posterior right iliac bone was targeted for biopsy. The skin and subcutaneous tissues were infiltrated with 1% lidocaine without epinephrine. A small stab incision was made with an 11 blade scalpel, and an 11 gauge Murphy needle was advanced with CT guidance to the posterior cortex. Manual forced was used to advance the needle through the posterior cortex and the stylet was removed. A bone marrow aspirate was retrieved and passed to a cytotechnologist in the room. The Murphy needle was then advanced without the stylet for a core biopsy. The core biopsy was retrieved and also passed to a cytotechnologist. Manual pressure was used for hemostasis and a sterile dressing was placed. No complications were encountered no significant blood loss was encountered. Patient tolerated the procedure well and remained hemodynamically stable throughout. After the bone marrow biopsy was completed, the patient was then positioned supine on her stretcher and we proceeded with the next staff of the planned case, a right axillary lymph node ultrasound-guided biopsy. The same sedation time is applied for both procedures. IMPRESSION: Status post CT-guided bone marrow biopsy, with tissue specimen sent to pathology for complete histopathologic analysis Signed, Dulcy Fanny. Earleen Newport, DO [facility] Radiology Electronically Signed   By: Corrie Mckusick D.O.   On: 10/30/2020 11:02   US Abdomen Limited RUQ (LIVER/GB)  Result Date: 10/28/2020 CLINICAL DATA:  Follow-up cirrhosis EXAM: ULTRASOUND ABDOMEN LIMITED RIGHT UPPER QUADRANT COMPARISON:  09/05/2019 FINDINGS: Gallbladder: Gallbladder is well distended with gallbladder sludge within.  Previously seen calculi are noted within the dependent sludge. No wall thickening or pericholecystic fluid is noted. Negative sonographic Murphy's sign is elicited. Common bile duct: Diameter: 2.3 mm. Liver: No focal lesion identified. Within normal limits in parenchymal echogenicity. Portal vein is patent on color Doppler imaging with normal direction of blood flow towards the liver. Other: None. IMPRESSION: Gallbladder sludge and cholelithiasis. No complicating factors are noted. Electronically Signed  By: Inez Catalina M.D.   On: 10/28/2020 23:34    ASSESSMENT AND PLAN:  Ms. Lintner is a 70 year old female with history of ITP and lymphadenopathy who presented to the hospital with severe thrombocytopenia and anemia.  1. Severe thrombocytopenia, ITP vs MDS/MPN  2. Normocytic anemia 3. Lymphadenopathy and splenomegaly  4. Type 2 diabetes mellitus with polyneuropathy 5.  Hypertension 6.  ?  Right wrist cellulitis  PLAN -Status post IVIG and prednisone, and Nplate 3 doses.  She has not had a good response to this treatment so far. -She was weaned down to 10 mg of prednisone daily starting on 6/3.  Okay to discontinue in the next 24 to 48 hours. -OK to hold platelet transfusion if platelet count above 5K, without significant bleeding.  If platelets are less than 5000, give 1 unit of platelets.  She will need a post platelet transfusion platelet count.  The repeat platelet count needs to be checked within 10 to 60 minutes of the end of her platelet transfusion.  The patient is receiving 1 unit of platelets today and will have a posttransfusion platelet count checked.  This was discussed with nursing. -Lymph node biopsy was negative for lymphoma, likely reactive.  IHC staining returned and findings are overall nonspecific and can be seen in reactive and infectious processes as well as Castleman disease. -Cytogenetics normal. 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  However her diagnosis has  been difficult, especially the exact etiology of severe thrombocytopenia and lymphadenopathy.  I offered referral to [facility] or [facility] specialist, she will think about it.  -she is also not sure if she wants to take chemo if offered.  -will present her case in our hematology tumor board this week.  Mikey Bussing  11/17/2020   Addendum  I have seen the patient, examined her. I agree with the assessment and and plan and have edited the notes.   Pt's post-transufsion plt went to [place] today. No active bleeding. I spoke with blood bank medical director Dr. Saralyn Pilar today regarding her platelet transfusion.  We will limit her platelet transfusion to 1 unit/day if plt<5K, if no significant bleeding.  We will find crossmatched platelet if possible.  Her HLA typing still pending. We reviewed her case in our hematology tumor board this morning, with the pathologist and my colleague Dr. Alvy Bimler today.  Her lymph node biopsy was negative for lymphoma, it showed follicular hyperplasia, which is likely reactive. MPN NGS panel is still pending. Dr. Alvy Bimler and I feel Rituximab is probably the best option for her severe refractory thrombocytopenia.  I discussed the benefit and side effects with patient and her daughter in detail today, which includes but not limited to, infusion reaction, severe immunosuppression and risk of infection and sepsis, risk of chronic virus flare, such as HBV or shingles.  After lengthy discussion, patient agrees to proceed. Plan to give her weeklyX4, with first dose in hospital. I will check inpt chemo team if see if we can do it in [facility] on Thursday or Friday, or transfer her to [facility] [address] for chemo.   Pt has been check for HBV and HCV during this admission,both were negative.   Please give COVID boost shot if we have tomorrow. Plan to give Evusheld injection for COVID prevention in my clinic.  I also recommend referral to [facility] hematology program for second opinion.  She  agreed.  She prefers to have a virtual visit if possible. I will arrange.  Truitt Merle  11/17/2020

## 2020-11-17 NOTE — Plan of Care (Signed)
  Problem: Pain Managment: Goal: General experience of comfort will improve Outcome: Adequate for Discharge   

## 2020-11-17 NOTE — Progress Notes (Signed)
PROGRESS NOTE    Kathleen Gibson  HUO:[id number] DOB: Jul 24, 1950 DOA: 10/28/2020 PCP: [name], DO   Brief Narrative:  70 year old F with PMH of DM-2 with neuropathy, ITP, gastric bypass, HTN, NAFLD and noncompliance presented to ED with severe thrombocytopenia, and admitted for acute on chronic ITP.  Platelet was 5K.  Treated with platelet transfusion (4 apheresis), IVIG, Nplate and oral prednisone with some initial improvement.   Patient had bone marrow and right axillary lymph node biopsy performed on 5/20. Differentials based on bone marrow biopsy include MPN/MDS versus [name]. Cytogenetics and FISH panel pending.  Unfortunately, right axillary biopsy was non diagnostic. Underwent excisional lymph node biopsy of left axillary nodes on 6/1.   Platelet is downtrended despite IVIG, Nplate, oral prednisone and multiple platelet transfusions.  Heme-onc following.   Assessment & Plan:   Principal Problem:   Acute ITP (HCC) Active Problems:   Type 2 diabetes mellitus with diabetic polyneuropathy, without long-term current use of insulin (HCC)   Diabetic polyneuropathy associated with type 2 diabetes mellitus (HCC)   Essential hypertension   B12 deficiency   History of noncompliance with medical treatment   Abdominal lymphadenopathy   Anemia   Mixed hyperlipidemia due to type 2 diabetes mellitus (HCC)   Lymphadenopathy   Refractory ITP/possible splenic sequestration: Baseline 40-90.  Initially improved after IVIG, steroids and platelet transfusion. Now trending down despite Nplate, steroid and  platelet transfusions. Fortunately, no signs of bleeding. -Continue weekly Nplate per heme-onc -Heme-onc tapering prednisone, on 10 mg 6/3>> -Transfuse for platelets<10K or bleeding. Received 19 apheresis of platelets so far.  -Appreciate heme-onc recs: 6/3-okay to discharge if platelets >5k.  Oncology updated the recommendations that there should be no more than 1 unit of platelet transfusion  in a day.  Platelets less than 5 again today.  Will order 1 unit and defer further management to oncology.  Diffuse lymphadenopathy/leukocytosis: s/p bone marrow and right axillary biopsy. Differentials based on bone marrow biopsy include MPN/MDS versus [name].  Unfortunately, right axillary biopsy was non diagnostic. She underwent left axillary lymph node excisional biopsy on 6/1.  Pathology negative for monotypic B-cell or immunophenotypically aberrant T-cell population but cannot rule out Hodgkin lymphoma, some T-cell lymphomas, metastatic and infectious diseases.  -Cytogenetics and FISH panel pending. -Heme-onc following.  Per heme-onc note, her biopsy result might come back later this week.  Patient aware as well.  Low-grade fever: Had low-grade fever of 100.9 on the morning of 11/15/2020.  No signs suggestive of any infection.  Chest x-ray and UA negative.  Blood culture done.  Monitor closely.  Has remained afebrile since then  Normocytic anemia: H&H stable. CBC Latest Ref Rng & Units 11/17/2020 11/16/2020 11/15/2020  WBC 4.0 - 10.5 K/uL 13.7(H) 16.8(H) 17.4(H)  Hemoglobin 12.0 - 15.0 g/dL 7.8(L) 8.4(L) 8.8(L)  Hematocrit 36.0 - 46.0 % 26.2(L) 27.5(L) 28.8(L)  Platelets 150 - 400 K/uL <5(LL) <5(LL) <5(LL)  Monitor intermittently  Controlled NIDDM-2 with polyneuropathy, hypoglycemia and hyperglycemia: A1c 5.9%.  Blood sugar very labile. -Continue SSI-moderate -NovoLog 6 units 3 times daily with meals -Lantus 15 units in the morning -Continue home gabapentin  Essential hypertension: Normotensive -Continue home metoprolol.  History of noncompliance with medical treatment -She has been counseled.   Leukocytosis/bandemia-likely demargination from steroid  Class I obesity Body mass index is 30.19 kg/m.  DVT prophylaxis: SCD's Start: 11/11/20 1313   Code Status: DNR  Family Communication: None present at bedside.  Plan of care discussed with patient in length and  he verbalized  understanding and agreed with it.  Status is: Inpatient  Remains inpatient appropriate because:Inpatient level of care appropriate due to severity of illness   Dispo: The patient is from: Home              Anticipated d/c is to: Home              Patient currently is not medically stable to d/c.   Difficult to place patient No        Estimated body mass index is 30.42 kg/m as calculated from the following:   Height as of this encounter: $RemoveBeforeD'5\' 3"'WdxQiwFZRnvvMG$  (1.6 m).   Weight as of this encounter: 77.9 kg.      Nutritional status:               Consultants:   Heme-onc  Procedures:   As mentioned above  Antimicrobials:  Anti-infectives (From admission, onward)   Start     Dose/Rate Route Frequency Ordered Stop   11/11/20 0600  ceFAZolin (ANCEF) IVPB 2g/100 mL premix        2 g 200 mL/hr over 30 Minutes Intravenous On call to O.R. 11/10/20 1553 11/11/20 1059         Subjective: Patient seen and examined.  She has no complaints.  She is very grateful for the care that she is receiving from the staff on the floor.  Objective: Vitals:   11/16/20 2240 11/17/20 0400 11/17/20 0401 11/17/20 0926  BP: (!) 113/57  105/62 128/62  Pulse: 98  85 97  Resp: $Remo'16  16 18  'uxGyP$ Temp: 98.3 F (36.8 C)  98.2 F (36.8 C) 98 F (36.7 C)  TempSrc: Oral  Oral Oral  SpO2: 98%  99% 98%  Weight:  77.9 kg    Height:        Intake/Output Summary (Last 24 hours) at 11/17/2020 0939 Last data filed at 11/17/2020 0300 Gross per 24 hour  Intake 342 ml  Output --  Net 342 ml   Filed Weights   11/02/20 2111 11/11/20 0942 11/17/20 0400  Weight: 77.3 kg 77.3 kg 77.9 kg    Examination:  General exam: Appears calm and comfortable  Respiratory system: Clear to auscultation. Respiratory effort normal. Cardiovascular system: S1 & S2 heard, RRR. No JVD, murmurs, rubs, gallops or clicks. No pedal edema. Gastrointestinal system: Abdomen is nondistended, soft and nontender. No organomegaly or  masses felt. Normal bowel sounds heard. Central nervous system: Alert and oriented. No focal neurological deficits. Extremities: Symmetric 5 x 5 power. Skin: Multiple bruises on upper extremities bilaterally Psychiatry: Judgement and insight appear normal. Mood & affect appropriate.    Data Reviewed: I have personally reviewed following labs and imaging studies  CBC: Recent Labs  Lab 11/12/20 1605 11/13/20 0609 11/15/20 0242 11/15/20 0453 11/15/20 1937 11/16/20 1455 11/17/20 0358  WBC 23.4*   < > 19.1* 18.8* 17.4* 16.8* 13.7*  NEUTROABS 18.0*  --   --   --   --   --   --   HGB 8.6*   < > 8.4* 8.5* 8.8* 8.4* 7.8*  HCT 28.2*   < > 27.2* 28.0* 28.8* 27.5* 26.2*  MCV 92.8   < > 93.2 92.7 93.2 92.9 94.2  PLT 10*   < > <5* <5* <5* <5* <5*   < > = values in this interval not displayed.   Basic Metabolic Panel: Recent Labs  Lab 11/16/20 1455  NA 134*  K 4.2  CL 104  CO2  26  GLUCOSE 154*  BUN 13  CREATININE 0.77  CALCIUM 8.3*   GFR: Estimated Creatinine Clearance: 65.6 mL/min (by C-G formula based on SCr of 0.77 mg/dL). Liver Function Tests: No results for input(s): AST, ALT, ALKPHOS, BILITOT, PROT, ALBUMIN in the last 168 hours. No results for input(s): LIPASE, AMYLASE in the last 168 hours. No results for input(s): AMMONIA in the last 168 hours. Coagulation Profile: No results for input(s): INR, PROTIME in the last 168 hours. Cardiac Enzymes: No results for input(s): CKTOTAL, CKMB, CKMBINDEX, TROPONINI in the last 168 hours. BNP (last 3 results) No results for input(s): PROBNP in the last 8760 hours. HbA1C: No results for input(s): HGBA1C in the last 72 hours. CBG: Recent Labs  Lab 11/16/20 1108 11/16/20 1626 11/16/20 2124 11/16/20 2217 11/17/20 0645  GLUCAP 133* 343* 77 184* 139*   Lipid Profile: No results for input(s): CHOL, HDL, LDLCALC, TRIG, CHOLHDL, LDLDIRECT in the last 72 hours. Thyroid Function Tests: No results for input(s): TSH, T4TOTAL, FREET4,  T3FREE, THYROIDAB in the last 72 hours. Anemia Panel: No results for input(s): VITAMINB12, FOLATE, FERRITIN, TIBC, IRON, RETICCTPCT in the last 72 hours. Sepsis Labs: No results for input(s): PROCALCITON, LATICACIDVEN in the last 168 hours.  Recent Results (from the past 240 hour(s))  Culture, Urine     Status: Abnormal   Collection Time: 11/15/20  5:08 AM   Specimen: Urine, Random  Result Value Ref Range Status   Specimen Description URINE, RANDOM  Final   Special Requests   Final    NONE Performed at [facility], [address] [place], [place] [address]    Culture MULTIPLE SPECIES PRESENT, SUGGEST RECOLLECTION (A)  Final   Report Status 11/16/2020 FINAL  Final  Culture, blood (routine x 2)     Status: None (Preliminary result)   Collection Time: 11/15/20  5:48 AM   Specimen: BLOOD  Result Value Ref Range Status   Specimen Description BLOOD LEFT ARM  Final   Special Requests   Final    BOTTLES DRAWN AEROBIC AND ANAEROBIC Blood Culture results may not be optimal due to an inadequate volume of blood received in culture bottles   Culture   Final    NO GROWTH 2 DAYS Performed at [facility], [address], [place], [place] [address]    Report Status PENDING  Incomplete  Culture, blood (routine x 2)     Status: None (Preliminary result)   Collection Time: 11/15/20  5:48 AM   Specimen: BLOOD  Result Value Ref Range Status   Specimen Description BLOOD RIGHT ARM  Final   Special Requests   Final    BOTTLES DRAWN AEROBIC AND ANAEROBIC Blood Culture adequate volume   Culture   Final    NO GROWTH 2 DAYS Performed at [facility], [address], [place], [place] [address]    Report Status PENDING  Incomplete      Radiology Studies: No results found.  Scheduled Meds: . cholecalciferol  1,000 Units Oral Daily  . gabapentin  800 mg Oral TID  . insulin aspart  0-15 Units Subcutaneous TID WC  . insulin aspart  0-5 Units Subcutaneous QHS  . insulin aspart   6 Units Subcutaneous TID WC  . insulin glargine  15 Units Subcutaneous Daily  . linagliptin  5 mg Oral QHS   And  . metFORMIN  1,000 mg Oral QHS  . metoprolol succinate  50 mg Oral Daily  . mirtazapine  15 mg Oral QHS  . predniSONE  10 mg Oral Q breakfast  . zolpidem  5 mg Oral QHS   Continuous Infusions:   LOS: 19 days   Time spent: 27 minutes   Darliss Cheney, MD [facility]  11/17/2020, 9:39 AM   How to contact the [facility] Attending or Consulting provider [place] or covering provider during after hours [place], for this patient?  1. Check the care team in [facility] and look for a) attending/consulting TRH provider listed and b) the [facility] team listed. Page or secure chat [phone]. 2. Log into [email or web address] and use [place]'s universal password to access. If you do not have the password, please contact the hospital operator. 3. Locate the [facility] provider you are looking for under [facility] and page to a number that you can be directly reached. 4. If you still have difficulty reaching the provider, please page the [facility] (Director on Call) for the Hospitalists listed on amion for assistance.

## 2020-11-18 LAB — CBC
HCT: 26.7 % — ABNORMAL LOW (ref 36.0–46.0)
Hemoglobin: 8 g/dL — ABNORMAL LOW (ref 12.0–15.0)
MCH: 27.8 pg (ref 26.0–34.0)
MCHC: 30 g/dL (ref 30.0–36.0)
MCV: 92.7 fL (ref 80.0–100.0)
Platelets: 14 10*3/uL — CL (ref 150–400)
RBC: 2.88 MIL/uL — ABNORMAL LOW (ref 3.87–5.11)
RDW: 18.8 % — ABNORMAL HIGH (ref 11.5–15.5)
WBC: 12.8 10*3/uL — ABNORMAL HIGH (ref 4.0–10.5)
nRBC: 0.4 % — ABNORMAL HIGH (ref 0.0–0.2)

## 2020-11-18 LAB — PREPARE PLATELET PHERESIS: Unit division: 0

## 2020-11-18 LAB — GLUCOSE, CAPILLARY
Glucose-Capillary: 110 mg/dL — ABNORMAL HIGH (ref 70–99)
Glucose-Capillary: 109 mg/dL — ABNORMAL HIGH (ref 70–99)
Glucose-Capillary: 175 mg/dL — ABNORMAL HIGH (ref 70–99)
Glucose-Capillary: 85 mg/dL (ref 70–99)

## 2020-11-18 LAB — BPAM PLATELET PHERESIS
Blood Product Expiration Date: 202206092359
ISSUE DATE / TIME: 202206071245
Unit Type and Rh: 5100

## 2020-11-18 MED ORDER — ROMIPLOSTIM 125 MCG ~~LOC~~ SOLR
5.0000 ug | Freq: Once | SUBCUTANEOUS | Status: AC
  Administered 2020-11-19: 5 ug via SUBCUTANEOUS
  Filled 2020-11-18 (×2): qty 0.01

## 2020-11-18 NOTE — Progress Notes (Signed)
Oncology brief note   I have coordinated with our iv team and pharmacy, our team is ready to give Rituximab at [facility] her room tomorrow at 10am. I have reviewed her lab today and it's adequate for treatment. Will get CMP tomorrow morning also.   I spoke with pt and her daughter and informed them the chemo plan. She agrees to proceed. Chemo consent was documented in her chemo treatment plan, and chemo orders are signed.   She is due for Nplate tomorrow also, will order it today.   I plan to stop by tomorrow late afternoon.   I will be available for phone call tomorrow during her chemo infusion.   Truitt Merle  11/18/2020  Cell: [phone]

## 2020-11-18 NOTE — Progress Notes (Signed)
PROGRESS NOTE    Kathleen Gibson  MMN:[id number] DOB: 25-Aug-1950 DOA: 10/28/2020 PCP: [name], DO   Brief Narrative:  70 year old F with PMH of DM-2 with neuropathy, ITP, gastric bypass, HTN, NAFLD and noncompliance presented to ED with severe thrombocytopenia, and admitted for acute on chronic ITP.  Platelet was 5K.  Treated with platelet transfusion (4 apheresis), IVIG, Nplate and oral prednisone with some initial improvement.   Patient had bone marrow and right axillary lymph node biopsy performed on 5/20. Differentials based on bone marrow biopsy include MPN/MDS versus [name]. Cytogenetics and FISH panel pending.  Unfortunately, right axillary biopsy was non diagnostic. Underwent excisional lymph node biopsy of left axillary nodes on 6/1.   Platelet is downtrended despite IVIG, Nplate, oral prednisone and multiple platelet transfusions.  Heme-onc following.   Assessment & Plan:   Principal Problem:   Acute ITP (HCC) Active Problems:   Type 2 diabetes mellitus with diabetic polyneuropathy, without long-term current use of insulin (HCC)   Diabetic polyneuropathy associated with type 2 diabetes mellitus (HCC)   Essential hypertension   B12 deficiency   History of noncompliance with medical treatment   Abdominal lymphadenopathy   Anemia   Mixed hyperlipidemia due to type 2 diabetes mellitus (HCC)   Lymphadenopathy   Refractory ITP/possible splenic sequestration: Baseline 40-90.  Initially improved after IVIG, steroids and platelet transfusion. Now trending down despite Nplate, steroid and  platelet transfusions. Fortunately, no signs of bleeding. -Continue weekly Nplate per heme-onc -Heme-onc tapering prednisone, on 10 mg 6/3>> -Transfuse for platelets<10K or bleeding. Received 19 apheresis of platelets so far.  -Appreciate heme-onc recs: 6/3-okay to discharge if platelets >5k.  Oncology updated the recommendations that there should be no more than 1 unit of platelet transfusion  in a day and that now they are planning to start her on rituximab once weekly for 4 weeks with the first dose in the hospital tomorrow or day after.  We are waiting for further recommendations however fortunately her platelets have improved to 14 today so no transfusion at least today.  Repeat labs in the morning.  Defer further management to oncology.  Diffuse lymphadenopathy/leukocytosis: s/p bone marrow and right axillary biopsy. Differentials based on bone marrow biopsy include MPN/MDS versus [name].  Unfortunately, right axillary biopsy was non diagnostic. She underwent left axillary lymph node excisional biopsy on 6/1.  Pathology negative for monotypic B-cell or immunophenotypically aberrant T-cell population but cannot rule out Hodgkin lymphoma, some T-cell lymphomas, metastatic and infectious diseases.  -Cytogenetics and FISH panel pending. -Heme-onc following.  Per heme-onc note, her biopsy result might come back later this week.  Patient aware as well.  Low-grade fever: Had low-grade fever of 100.9 on the morning of 11/15/2020.  No signs suggestive of any infection.  Chest x-ray and UA negative.  Blood culture done.  Monitor closely.  Has remained afebrile since then  Normocytic anemia: H&H stable. CBC Latest Ref Rng & Units 11/18/2020 11/17/2020 11/17/2020  WBC 4.0 - 10.5 K/uL 12.8(H) 16.6(H) 13.7(H)  Hemoglobin 12.0 - 15.0 g/dL 8.0(L) 8.6(L) 7.8(L)  Hematocrit 36.0 - 46.0 % 26.7(L) 29.1(L) 26.2(L)  Platelets 150 - 400 K/uL 14(LL) 9(LL) <5(LL)  Monitor intermittently  Controlled NIDDM-2 with polyneuropathy, hypoglycemia and hyperglycemia: A1c 5.9%.  Blood sugar very labile. -Continue SSI-moderate -NovoLog 6 units 3 times daily with meals -Lantus 15 units in the morning -Continue home gabapentin  Essential hypertension: Normotensive -Continue home metoprolol.  History of noncompliance with medical treatment -She has been counseled.  Leukocytosis/bandemia-likely demargination  from steroid  Class I obesity Body mass index is 30.19 kg/m.  DVT prophylaxis: SCD's Start: 11/11/20 1313   Code Status: DNR  Family Communication: None present at bedside.  Plan of care discussed with patient in length and she verbalized understanding and agreed with it.  Status is: Inpatient  Remains inpatient appropriate because:Inpatient level of care appropriate due to severity of illness   Dispo: The patient is from: Home              Anticipated d/c is to: Home              Patient currently is not medically stable to d/c.   Difficult to place patient No        Estimated body mass index is 30.42 kg/m as calculated from the following:   Height as of this encounter: $RemoveBeforeD'5\' 3"'[id number]$  (1.6 m).   Weight as of this encounter: 77.9 kg.      Nutritional status:               Consultants:   Heme-onc  Procedures:   As mentioned above  Antimicrobials:  Anti-infectives (From admission, onward)   Start     Dose/Rate Route Frequency Ordered Stop   11/11/20 0600  ceFAZolin (ANCEF) IVPB 2g/100 mL premix        2 g 200 mL/hr over 30 Minutes Intravenous On call to O.R. 11/10/20 1553 11/11/20 1059         Subjective: Patient seen and examined.  She has no complaints other than weakness but on the other side, she is delighted to hear that her platelets have finally improved somewhat today.  Objective: Vitals:   11/17/20 1424 11/17/20 1636 11/17/20 2145 11/18/20 0925  BP: 127/63 134/67 123/81 132/62  Pulse: 96 88 92 (!) 106  Resp: $Remo'18 18 18 16  'HDvPa$ Temp: 97.7 F (36.5 C) 98.4 F (36.9 C) 98.5 F (36.9 C) 98.8 F (37.1 C)  TempSrc: Oral Oral Oral Oral  SpO2: 97% 99% 98% 97%  Weight:      Height:        Intake/Output Summary (Last 24 hours) at 11/18/2020 1126 Last data filed at 11/17/2020 1700 Gross per 24 hour  Intake 776.67 ml  Output 250 ml  Net 526.67 ml   Filed Weights   11/02/20 2111 11/11/20 0942 11/17/20 0400  Weight: 77.3 kg 77.3 kg 77.9 kg     Examination:  General exam: Appears calm and comfortable  Respiratory system: Clear to auscultation. Respiratory effort normal. Cardiovascular system: S1 & S2 heard, RRR. No JVD, murmurs, rubs, gallops or clicks. No pedal edema. Gastrointestinal system: Abdomen is nondistended, soft and nontender. No organomegaly or masses felt. Normal bowel sounds heard. Central nervous system: Alert and oriented. No focal neurological deficits. Extremities: Symmetric 5 x 5 power. Skin: Multiple bruises in the upper extremities bilaterally Psychiatry: Judgement and insight appear normal. Mood & affect appropriate.   Data Reviewed: I have personally reviewed following labs and imaging studies  CBC: Recent Labs  Lab 11/12/20 1605 11/13/20 0609 11/15/20 1937 11/16/20 1455 11/17/20 0358 11/17/20 1516 11/18/20 0544  WBC 23.4*   < > 17.4* 16.8* 13.7* 16.6* 12.8*  NEUTROABS 18.0*  --   --   --   --   --   --   HGB 8.6*   < > 8.8* 8.4* 7.8* 8.6* 8.0*  HCT 28.2*   < > 28.8* 27.5* 26.2* 29.1* 26.7*  MCV 92.8   < > 93.2  92.9 94.2 94.5 92.7  PLT 10*   < > <5* <5* <5* 9* 14*   < > = values in this interval not displayed.   Basic Metabolic Panel: Recent Labs  Lab 11/16/20 1455  NA 134*  K 4.2  CL 104  CO2 26  GLUCOSE 154*  BUN 13  CREATININE 0.77  CALCIUM 8.3*   GFR: Estimated Creatinine Clearance: 65.6 mL/min (by C-G formula based on SCr of 0.77 mg/dL). Liver Function Tests: No results for input(s): AST, ALT, ALKPHOS, BILITOT, PROT, ALBUMIN in the last 168 hours. No results for input(s): LIPASE, AMYLASE in the last 168 hours. No results for input(s): AMMONIA in the last 168 hours. Coagulation Profile: No results for input(s): INR, PROTIME in the last 168 hours. Cardiac Enzymes: No results for input(s): CKTOTAL, CKMB, CKMBINDEX, TROPONINI in the last 168 hours. BNP (last 3 results) No results for input(s): PROBNP in the last 8760 hours. HbA1C: No results for input(s): HGBA1C in the  last 72 hours. CBG: Recent Labs  Lab 11/17/20 0645 11/17/20 1136 11/17/20 1634 11/17/20 2218 11/18/20 0659  GLUCAP 139* 128* 109* 81 110*   Lipid Profile: No results for input(s): CHOL, HDL, LDLCALC, TRIG, CHOLHDL, LDLDIRECT in the last 72 hours. Thyroid Function Tests: No results for input(s): TSH, T4TOTAL, FREET4, T3FREE, THYROIDAB in the last 72 hours. Anemia Panel: No results for input(s): VITAMINB12, FOLATE, FERRITIN, TIBC, IRON, RETICCTPCT in the last 72 hours. Sepsis Labs: No results for input(s): PROCALCITON, LATICACIDVEN in the last 168 hours.  Recent Results (from the past 240 hour(s))  Culture, Urine     Status: Abnormal   Collection Time: 11/15/20  5:08 AM   Specimen: Urine, Random  Result Value Ref Range Status   Specimen Description URINE, RANDOM  Final   Special Requests   Final    NONE Performed at [facility], [address], [place], [place] [address]    Culture MULTIPLE SPECIES PRESENT, SUGGEST RECOLLECTION (A)  Final   Report Status 11/16/2020 FINAL  Final  Culture, blood (routine x 2)     Status: None (Preliminary result)   Collection Time: 11/15/20  5:48 AM   Specimen: BLOOD  Result Value Ref Range Status   Specimen Description BLOOD LEFT ARM  Final   Special Requests   Final    BOTTLES DRAWN AEROBIC AND ANAEROBIC Blood Culture results may not be optimal due to an inadequate volume of blood received in culture bottles   Culture   Final    NO GROWTH 3 DAYS Performed at [facility], [place] [address], [place], [place] [address]    Report Status PENDING  Incomplete  Culture, blood (routine x 2)     Status: None (Preliminary result)   Collection Time: 11/15/20  5:48 AM   Specimen: BLOOD  Result Value Ref Range Status   Specimen Description BLOOD RIGHT ARM  Final   Special Requests   Final    BOTTLES DRAWN AEROBIC AND ANAEROBIC Blood Culture adequate volume   Culture   Final    NO GROWTH 3 DAYS Performed at [facility], [address], [place], [place] [address]    Report Status PENDING  Incomplete      Radiology Studies: No results found.  Scheduled Meds: . sodium chloride   Intravenous Once  . cholecalciferol  1,000 Units Oral Daily  . gabapentin  800 mg Oral TID  . insulin aspart  0-15 Units Subcutaneous TID WC  . insulin  aspart  0-5 Units Subcutaneous QHS  . insulin aspart  6 Units Subcutaneous TID WC  . insulin glargine  15 Units Subcutaneous Daily  . linagliptin  5 mg Oral QHS   And  . metFORMIN  1,000 mg Oral QHS  . metoprolol succinate  50 mg Oral Daily  . mirtazapine  15 mg Oral QHS  . predniSONE  10 mg Oral Q breakfast  . zolpidem  5 mg Oral QHS   Continuous Infusions:   LOS: 20 days   Time spent: 26 minutes   Darliss Cheney, MD [facility]  11/18/2020, 11:26 AM   How to contact the [facility] Attending or Consulting provider [place] or covering provider during after hours [place], for this patient?  1. Check the care team in [facility] and look for a) attending/consulting TRH provider listed and b) the [facility] team listed. Page or secure chat [phone]. 2. Log into [email or web address] and use [place]'s universal password to access. If you do not have the password, please contact the hospital operator. 3. Locate the [facility] provider you are looking for under [facility] and page to a number that you can be directly reached. 4. If you still have difficulty reaching the provider, please page the [facility] (Director on Call) for the Hospitalists listed on amion for assistance.

## 2020-11-19 LAB — GLUCOSE, CAPILLARY
Glucose-Capillary: 108 mg/dL — ABNORMAL HIGH (ref 70–99)
Glucose-Capillary: 103 mg/dL — ABNORMAL HIGH (ref 70–99)
Glucose-Capillary: 107 mg/dL — ABNORMAL HIGH (ref 70–99)
Glucose-Capillary: 185 mg/dL — ABNORMAL HIGH (ref 70–99)

## 2020-11-19 LAB — CBC
HCT: 26.8 % — ABNORMAL LOW (ref 36.0–46.0)
Hemoglobin: 8.1 g/dL — ABNORMAL LOW (ref 12.0–15.0)
MCH: 28.4 pg (ref 26.0–34.0)
MCHC: 30.2 g/dL (ref 30.0–36.0)
MCV: 94 fL (ref 80.0–100.0)
Platelets: 18 10*3/uL — CL (ref 150–400)
RBC: 2.85 MIL/uL — ABNORMAL LOW (ref 3.87–5.11)
RDW: 19.1 % — ABNORMAL HIGH (ref 11.5–15.5)
WBC: 13.1 10*3/uL — ABNORMAL HIGH (ref 4.0–10.5)
nRBC: 0.5 % — ABNORMAL HIGH (ref 0.0–0.2)

## 2020-11-19 LAB — COMPREHENSIVE METABOLIC PANEL
ALT: 16 U/L (ref 0–44)
AST: 22 U/L (ref 15–41)
Albumin: 2.5 g/dL — ABNORMAL LOW (ref 3.5–5.0)
Alkaline Phosphatase: 54 U/L (ref 38–126)
Anion gap: 6 (ref 5–15)
BUN: 19 mg/dL (ref 8–23)
CO2: 24 mmol/L (ref 22–32)
Calcium: 8.2 mg/dL — ABNORMAL LOW (ref 8.9–10.3)
Chloride: 104 mmol/L (ref 98–111)
Creatinine, Ser: 1.01 mg/dL — ABNORMAL HIGH (ref 0.44–1.00)
GFR, Estimated: >60 mL/min (ref >60)
Glucose, Bld: 90 mg/dL (ref 70–99)
Potassium: 4 mmol/L (ref 3.5–5.1)
Sodium: 134 mmol/L — ABNORMAL LOW (ref 135–145)
Total Bilirubin: 0.7 mg/dL (ref 0.3–1.2)
Total Protein: 8.5 g/dL — ABNORMAL HIGH (ref 6.5–8.1)

## 2020-11-19 MED ORDER — SODIUM CHLORIDE 0.9 % IV SOLN
375.0000 mg/m2 | Freq: Once | INTRAVENOUS | Status: AC
  Administered 2020-11-19: 700 mg via INTRAVENOUS
  Filled 2020-11-19: qty 70

## 2020-11-19 MED ORDER — PREDNISONE 5 MG PO TABS
5.0000 mg | ORAL_TABLET | Freq: Every day | ORAL | Status: DC
  Administered 2020-11-20: 5 mg via ORAL
  Filled 2020-11-19: qty 1

## 2020-11-19 MED ORDER — DIPHENHYDRAMINE HCL 25 MG PO CAPS
50.0000 mg | ORAL_CAPSULE | Freq: Once | ORAL | Status: AC
  Administered 2020-11-19: 50 mg via ORAL
  Filled 2020-11-19: qty 2

## 2020-11-19 MED ORDER — SODIUM CHLORIDE 0.9 % IV SOLN: Freq: Once | INTRAVENOUS | Status: AC

## 2020-11-19 MED ORDER — ACETAMINOPHEN 325 MG PO TABS
650.0000 mg | ORAL_TABLET | Freq: Once | ORAL | Status: AC
  Administered 2020-11-19: 650 mg via ORAL
  Filled 2020-11-19: qty 2

## 2020-11-19 NOTE — Progress Notes (Signed)
Spoke to [place] regarding the plan to administer Rituxan at 10am. Requested she order a yellow bucket and chemo gowns to have the pt bedside. [employer]

## 2020-11-19 NOTE — Progress Notes (Signed)
Spoke with pharmacy regarding Rituxan infusion. Not ready but they are in the process of making it and will notify the unit when it's ready.

## 2020-11-19 NOTE — Progress Notes (Signed)
PROGRESS NOTE    Kathleen Gibson  IHK:[id number] DOB: [birth year] DOA: 10/28/2020 PCP: [name], DO   Brief Narrative:  70 year old F with PMH of DM-2 with neuropathy, ITP, gastric bypass, HTN, NAFLD and noncompliance presented to ED with severe thrombocytopenia, and admitted for acute on chronic ITP.  Platelet was 5K.  Treated with platelet transfusion (4 apheresis), IVIG, Nplate and oral prednisone with some initial improvement.    Patient had bone marrow and right axillary lymph node biopsy performed on 5/20. Differentials based on bone marrow biopsy include MPN/MDS versus [name]. Cytogenetics and FISH panel pending.  Unfortunately, right axillary biopsy was non diagnostic. Underwent excisional lymph node biopsy of left axillary nodes on 6/1.    Platelet is downtrended despite IVIG, Nplate, oral prednisone and multiple platelet transfusions.  Heme-onc following.   Assessment & Plan:   Principal Problem:   Acute ITP (HCC) Active Problems:   Type 2 diabetes mellitus with diabetic polyneuropathy, without long-term current use of insulin (HCC)   Diabetic polyneuropathy associated with type 2 diabetes mellitus (HCC)   Essential hypertension   B12 deficiency   History of noncompliance with medical treatment   Abdominal lymphadenopathy   Anemia   Mixed hyperlipidemia due to type 2 diabetes mellitus (HCC)   Lymphadenopathy   Refractory ITP/possible splenic sequestration: Baseline 40-90.  Initially improved after IVIG, steroids and platelet transfusion. Now trending down despite Nplate, steroid and  platelet transfusions. Fortunately, no signs of bleeding. -Continue weekly Nplate per heme-onc -Heme-onc tapering prednisone, on 10 mg 6/3>> -Transfuse for platelets<10K or bleeding. Received 19 apheresis of platelets so far.  -Appreciate heme-onc recs: 6/3-okay to discharge if platelets >5k.  Oncology updated the recommendations that there should be no more than 1 unit of platelet transfusion  in a day and that now they are planning to start her on rituximab once weekly for 4 weeks with the first dose in the hospital today.  Her platelets improved to 18.  No signs of bleeding.  Per oncology, she will be ready for discharge tomorrow if everything goes well and platelets continue to improve.   Diffuse lymphadenopathy/leukocytosis: s/p bone marrow and right axillary biopsy. Differentials based on bone marrow biopsy include MPN/MDS versus [name].  Unfortunately, right axillary biopsy was non diagnostic. She underwent left axillary lymph node excisional biopsy on 6/1.  Pathology negative for monotypic B-cell or immunophenotypically aberrant T-cell population but cannot rule out Hodgkin lymphoma, some T-cell lymphomas, metastatic and infectious diseases.   -Cytogenetics and FISH panel pending. -Heme-onc following.  Per heme-onc note, her biopsy result might come back later this week.  Patient aware as well.  Low-grade fever: Had low-grade fever of 100.9 on the morning of 11/15/2020.  No signs suggestive of any infection.  Chest x-ray and UA negative.  Blood culture done.  Monitor closely.  Has remained afebrile since then   Normocytic anemia: H&H stable. CBC Latest Ref Rng & Units 11/19/2020 11/18/2020 11/17/2020  WBC 4.0 - 10.5 K/uL 13.1(H) 12.8(H) 16.6(H)  Hemoglobin 12.0 - 15.0 g/dL 8.1(L) 8.0(L) 8.6(L)  Hematocrit 36.0 - 46.0 % 26.8(L) 26.7(L) 29.1(L)  Platelets 150 - 400 K/uL 18(LL) 14(LL) 9(LL)  Monitor intermittently   Controlled NIDDM-2 with polyneuropathy, hypoglycemia and hyperglycemia: A1c 5.9%.  Blood sugar very labile. -Continue SSI-moderate -NovoLog 6 units 3 times daily with meals -Lantus 15 units in the morning -Continue home gabapentin   Essential hypertension: Normotensive -Continue home metoprolol.    History of noncompliance with medical treatment -She has been counseled.  Leukocytosis/bandemia-likely demargination from steroid   Class I obesity Body mass index is  30.19 kg/m.  DVT prophylaxis: SCD's Start: 11/11/20 1313   Code Status: DNR  Family Communication: None present at bedside.  Plan of care discussed with patient in length and she verbalized understanding and agreed with it.  Status is: Inpatient  Remains inpatient appropriate because:Inpatient level of care appropriate due to severity of illness   Dispo: The patient is from: Home              Anticipated d/c is to: Home              Patient currently is not medically stable to d/c.   Difficult to place patient No        Estimated body mass index is 30.42 kg/m as calculated from the following:   Height as of this encounter: $RemoveBeforeD'5\' 3"'DPpRtXYrfXadEw$  (1.6 m).   Weight as of this encounter: 77.9 kg.      Nutritional status:               Consultants:  Heme-onc  Procedures:  As mentioned above  Antimicrobials:  Anti-infectives (From admission, onward)    Start     Dose/Rate Route Frequency Ordered Stop   11/11/20 0600  ceFAZolin (ANCEF) IVPB 2g/100 mL premix        2 g 200 mL/hr over 30 Minutes Intravenous On call to O.R. 11/10/20 1553 11/11/20 1059          Subjective: Seen and examined.  No complaints  Objective: Vitals:   11/18/20 1727 11/18/20 2033 11/19/20 0539 11/19/20 1000  BP: 125/80 115/66 122/63 115/60  Pulse: 95 97 96 88  Resp: $Remo'16 17 17 18  'zLeYx$ Temp: 99.3 F (37.4 C) 99.1 F (37.3 C) 98.1 F (36.7 C) 98.9 F (37.2 C)  TempSrc: Oral Oral Oral Oral  SpO2: 97% 96% 97% 100%  Weight:      Height:        Intake/Output Summary (Last 24 hours) at 11/19/2020 1250 Last data filed at 11/19/2020 0947 Gross per 24 hour  Intake 1071 ml  Output --  Net 1071 ml    Filed Weights   11/02/20 2111 11/11/20 0942 11/17/20 0400  Weight: 77.3 kg 77.3 kg 77.9 kg    Examination:  General exam: Appears calm and comfortable  Respiratory system: Clear to auscultation. Respiratory effort normal. Cardiovascular system: S1 & S2 heard, RRR. No JVD, murmurs, rubs, gallops  or clicks. No pedal edema. Gastrointestinal system: Abdomen is nondistended, soft and nontender. No organomegaly or masses felt. Normal bowel sounds heard. Central nervous system: Alert and oriented. No focal neurological deficits. Extremities: Symmetric 5 x 5 power. Skin: Multiple bruises bilateral upper extremities stable Psychiatry: Judgement and insight appear normal. Mood & affect appropriate.    Data Reviewed: I have personally reviewed following labs and imaging studies  CBC: Recent Labs  Lab 11/12/20 1605 11/13/20 0609 11/16/20 1455 11/17/20 0358 11/17/20 1516 11/18/20 0544 11/19/20 0141  WBC 23.4*   < > 16.8* 13.7* 16.6* 12.8* 13.1*  NEUTROABS 18.0*  --   --   --   --   --   --   HGB 8.6*   < > 8.4* 7.8* 8.6* 8.0* 8.1*  HCT 28.2*   < > 27.5* 26.2* 29.1* 26.7* 26.8*  MCV 92.8   < > 92.9 94.2 94.5 92.7 94.0  PLT 10*   < > <5* <5* 9* 14* 18*   < > = values in this  interval not displayed.    Basic Metabolic Panel: Recent Labs  Lab 11/16/20 1455 11/19/20 0141  NA 134* 134*  K 4.2 4.0  CL 104 104  CO2 26 24  GLUCOSE 154* 90  BUN 13 19  CREATININE 0.77 1.01*  CALCIUM 8.3* 8.2*    GFR: Estimated Creatinine Clearance: 52 mL/min (A) (by C-G formula based on SCr of 1.01 mg/dL (H)). Liver Function Tests: Recent Labs  Lab 11/19/20 0141  AST 22  ALT 16  ALKPHOS 54  BILITOT 0.7  PROT 8.5*  ALBUMIN 2.5*   No results for input(s): LIPASE, AMYLASE in the last 168 hours. No results for input(s): AMMONIA in the last 168 hours. Coagulation Profile: No results for input(s): INR, PROTIME in the last 168 hours. Cardiac Enzymes: No results for input(s): CKTOTAL, CKMB, CKMBINDEX, TROPONINI in the last 168 hours. BNP (last 3 results) No results for input(s): PROBNP in the last 8760 hours. HbA1C: No results for input(s): HGBA1C in the last 72 hours. CBG: Recent Labs  Lab 11/18/20 1147 11/18/20 1653 11/18/20 2035 11/19/20 0656 11/19/20 1150  GLUCAP 109* 175* 85  108* 103*    Lipid Profile: No results for input(s): CHOL, HDL, LDLCALC, TRIG, CHOLHDL, LDLDIRECT in the last 72 hours. Thyroid Function Tests: No results for input(s): TSH, T4TOTAL, FREET4, T3FREE, THYROIDAB in the last 72 hours. Anemia Panel: No results for input(s): VITAMINB12, FOLATE, FERRITIN, TIBC, IRON, RETICCTPCT in the last 72 hours. Sepsis Labs: No results for input(s): PROCALCITON, LATICACIDVEN in the last 168 hours.  Recent Results (from the past 240 hour(s))  Culture, Urine     Status: Abnormal   Collection Time: 11/15/20  5:08 AM   Specimen: Urine, Random  Result Value Ref Range Status   Specimen Description URINE, RANDOM  Final   Special Requests   Final    NONE Performed at [facility], [address] [place], [place] [address]    Culture MULTIPLE SPECIES PRESENT, SUGGEST RECOLLECTION (A)  Final   Report Status 11/16/2020 FINAL  Final  Culture, blood (routine x 2)     Status: None (Preliminary result)   Collection Time: 11/15/20  5:48 AM   Specimen: BLOOD  Result Value Ref Range Status   Specimen Description BLOOD LEFT ARM  Final   Special Requests   Final    BOTTLES DRAWN AEROBIC AND ANAEROBIC Blood Culture results may not be optimal due to an inadequate volume of blood received in culture bottles   Culture   Final    NO GROWTH 4 DAYS Performed at [facility], [place] [address] [place], [place] [address]    Report Status PENDING  Incomplete  Culture, blood (routine x 2)     Status: None (Preliminary result)   Collection Time: 11/15/20  5:48 AM   Specimen: BLOOD  Result Value Ref Range Status   Specimen Description BLOOD RIGHT ARM  Final   Special Requests   Final    BOTTLES DRAWN AEROBIC AND ANAEROBIC Blood Culture adequate volume   Culture   Final    NO GROWTH 4 DAYS Performed at [facility], [place] [address], [place], [place] [address]    Report Status PENDING  Incomplete      Radiology Studies: No results found.  Scheduled  Meds:  sodium chloride   Intravenous Once   cholecalciferol  1,000 Units Oral Daily   gabapentin  800 mg Oral TID   insulin aspart  0-15 Units Subcutaneous TID WC  insulin aspart  0-5 Units Subcutaneous QHS   insulin aspart  6 Units Subcutaneous TID WC   insulin glargine  15 Units Subcutaneous Daily   linagliptin  5 mg Oral QHS   And   metFORMIN  1,000 mg Oral QHS   metoprolol succinate  50 mg Oral Daily   mirtazapine  15 mg Oral QHS   predniSONE  10 mg Oral Q breakfast   zolpidem  5 mg Oral QHS   Continuous Infusions:   LOS: 21 days   Time spent: 25 minutes   Darliss Cheney, MD [facility]  11/19/2020, 12:50 PM   How to contact the [facility] Attending or Consulting provider [place] or covering provider during after hours [place], for this patient?  Check the care team in [facility] and look for a) attending/consulting [facility] provider listed and b) the [facility] team listed. Page or secure chat [phone]. Log into [email or web address] and use [place]'s universal password to access. If you do not have the password, please contact the hospital operator. Locate the [facility] provider you are looking for under [facility] and page to a number that you can be directly reached. If you still have difficulty reaching the provider, please page the [facility] (Director on Call) for the Hospitalists listed on amion for assistance.

## 2020-11-19 NOTE — Progress Notes (Signed)
HEMATOLOGY-ONCOLOGY PROGRESS NOTE  SUBJECTIVE: The patient received the first dose Rituxan today.  She tolerated infusion well overall, had a mild chills for 30 minutes, no fever, or other reactions.  She completed infusion around 3 PM.  She has bruises on her arms, no other bleeding.  REVIEW OF SYSTEMS:   10 Point review of Systems was done is negative except as noted above.  I have reviewed the past medical history, past surgical history, social history and family history with the patient and they are unchanged from previous note.   PHYSICAL EXAMINATION: ECOG PERFORMANCE STATUS: 2 - Symptomatic, <50% confined to bed  Vitals:   11/19/20 1629 11/19/20 2034  BP: 111/69 118/62  Pulse: 87 91  Resp: 18 18  Temp: 99.4 F (37.4 C) 98 F (36.7 C)  SpO2: 97% 95%   Filed Weights   11/02/20 2111 11/11/20 0942 11/17/20 0400  Weight: 170 lb 6.7 oz (77.3 kg) 170 lb 6.7 oz (77.3 kg) 171 lb 11.8 oz (77.9 kg)    Intake/Output from previous day: 06/08 0701 - 06/09 0700 In: 931 [P.O.:931] Out: 700 [Urine:700]   GENERAL:alert, in no acute distress and comfortable SKIN: No rashes, a few scattered ecchymoses on arms   LABORATORY DATA:  I have reviewed the data as listed CMP Latest Ref Rng & Units 11/19/2020 11/16/2020 11/10/2020  Glucose 70 - 99 mg/dL 90 154(H) 145(H)  BUN 8 - 23 mg/dL $Remove'19 13 20  'qzcAMWt$ Creatinine 0.44 - 1.00 mg/dL 1.01(H) 0.77 0.73  Sodium 135 - 145 mmol/L 134(L) 134(L) 137  Potassium 3.5 - 5.1 mmol/L 4.0 4.2 4.3  Chloride 98 - 111 mmol/L 104 104 105  CO2 22 - 32 mmol/L $RemoveB'24 26 25  'IKmGHfgI$ Calcium 8.9 - 10.3 mg/dL 8.2(L) 8.3(L) 8.5(L)  Total Protein 6.5 - 8.1 g/dL 8.5(H) - -  Total Bilirubin 0.3 - 1.2 mg/dL 0.7 - -  Alkaline Phos 38 - 126 U/L 54 - -  AST 15 - 41 U/L 22 - -  ALT 0 - 44 U/L 16 - -    Lab Results  Component Value Date   WBC 13.1 (H) 11/19/2020   HGB 8.1 (L) 11/19/2020   HCT 26.8 (L) 11/19/2020   MCV 94.0 11/19/2020   PLT 18 (LL) 11/19/2020   NEUTROABS 18.0 (H)  11/12/2020    CT ABDOMEN PELVIS WO CONTRAST  Result Date: 10/29/2020 CLINICAL DATA:  Hematologic malignancy, staging. EXAM: CT CHEST, ABDOMEN AND PELVIS WITHOUT CONTRAST TECHNIQUE: Multidetector CT imaging of the chest, abdomen and pelvis was performed following the standard protocol without IV contrast. COMPARISON:  History of MRI diarrhea neck no palpable status chest left at [facility] 7 right-sided than she thickened change ir indications sick concern for insert at the right 6 arm. FINDINGS: CT CHEST FINDINGS Cardiovascular: The aortic and branch vessel atherosclerosis without aneurysmal dilation. Normal size heart. No significant pericardial effusion/thickening. Mediastinum/Nodes: Enlarged supraclavicular, bilateral axillary and left mammary lymph nodes. For reference: Left internal mammary lymph node measures 1.9 by 1.2 cm on image 52/7. Right axillary lymph node measures 3.1 x 2.3 cm on image 36/7. Prominent mediastinal lymph nodes measuring up to 9 mm in short axis on image 39/7, without adenopathy by size criteria. Thyroid is grossly unremarkable. Trachea esophagus are grossly unremarkable. Lungs/Pleura: Right lower lobe calcified granuloma. Bibasilar atelectasis. Scattered bilateral micro nodules measuring 1-2 mm for instance on image 96/4 in the left lower lobe and image 72/4 in the right upper lobe. Musculoskeletal: Peripherally calcified nodule in the right axilla on  image 22/3. No acute osseous abnormality. CT ABDOMEN PELVIS FINDINGS Hepatobiliary: Grossly unremarkable noncontrast appearance of the hepatic parenchyma. No hepatomegaly. Gallbladder is distended with some layering hyperdense material representing sludge and tiny stones seen on prior ultrasound. No biliary ductal dilation. Pancreas: Within normal limits. Spleen: Splenomegaly measuring 22 cm in maximum craniocaudal dimension. Adrenals/Urinary Tract: Adrenal glands are unremarkable. Kidneys are normal, without renal calculi, contour  deforming lesion, or hydronephrosis. Bladder is unremarkable. Stomach/Bowel: Prior gastric bypass surgery. Enteric contrast visualized to the level of the distal small bowel. No pathologic dilation of small bowel. Appendix is grossly unremarkable. Colonic diverticulosis without findings of acute diverticulitis. Vascular/Lymphatic: Aortic atherosclerosis without aneurysmal dilation. No significant interval change in the enlarged and prominent upper abdominal, retroperitoneal and mesenteric lymph nodes. Interval increase in size of the few enlarged and prominent pelvic lymph nodes. Reference lymph nodes are as follows: Periportal lymph node measuring 3.3 x 1.7 cm on image 54/3, unchanged. Aortocaval lymph node measures 1.6 x 1.5 cm on image 69/3, unchanged. Right pelvic sidewall lymph node measures 3.1 x 1.6 cm on image 109/3. Reproductive: Uterus and bilateral adnexa are unremarkable. Other: Small volume abdominopelvic ascites with diffuse mesenteric stranding. Multiple peritoneal calcifications are again visualized and unchanged from prior, likely sequela prior inflammation. Similar appearance of the nonspecific anterior abdominal wall subcutaneous calcifications. Mild diffuse subcutaneous edema. Musculoskeletal: Multilevel degenerative changes spine. No acute osseous abnormality. IMPRESSION: 1. Splenomegaly with pathologically enlarged lymph nodes above and below the diaphragm, with overall stable to minimally increased abdominal adenopathy and interval progression of the pelvic adenopathy. 2. Small volume abdominopelvic ascites with diffuse mesenteric stranding. 3. Scattered bilateral pulmonary micro nodules measuring 1-2 mm. 4. Distended gallbladder with some layering hyperdense material representing layering sludge and tiny stones seen on prior ultrasound. 5. Aortic atherosclerosis. Aortic Atherosclerosis (ICD10-I70.0). Electronically Signed   By: Dahlia Bailiff MD   On: 10/29/2020 19:53   DG Chest 1  View  Result Date: 11/15/2020 CLINICAL DATA:  70 year old female with pneumonia. Hematologic malignancy. EXAM: CHEST  1 VIEW COMPARISON:  Staging CT Chest, Abdomen, and Pelvis 10/29/2020 and earlier. FINDINGS: Portable AP semi upright view at 0549 hours. Lung volumes and mediastinal contours are stable, within normal limits. Visualized tracheal air column is within normal limits. Mild curvilinear atelectasis projecting over the right hemidiaphragm is stable from last month. Elsewhere Allowing for portable technique the lungs are clear. No pleural effusion. No acute osseous abnormality identified. Paucity of bowel gas in the upper abdomen. IMPRESSION: Right lung base atelectasis is stable from the CT last month. No new cardiopulmonary abnormality identified. Electronically Signed   By: Genevie Ann M.D.   On: 11/15/2020 06:02   CT CHEST WO CONTRAST  Result Date: 10/29/2020 CLINICAL DATA:  Hematologic malignancy, staging. EXAM: CT CHEST, ABDOMEN AND PELVIS WITHOUT CONTRAST TECHNIQUE: Multidetector CT imaging of the chest, abdomen and pelvis was performed following the standard protocol without IV contrast. COMPARISON:  History of MRI diarrhea neck no palpable status chest left at [facility] 7 right-sided than she thickened change ir indications sick concern for insert at the right 6 arm. FINDINGS: CT CHEST FINDINGS Cardiovascular: The aortic and branch vessel atherosclerosis without aneurysmal dilation. Normal size heart. No significant pericardial effusion/thickening. Mediastinum/Nodes: Enlarged supraclavicular, bilateral axillary and left mammary lymph nodes. For reference: Left internal mammary lymph node measures 1.9 by 1.2 cm on image 52/7. Right axillary lymph node measures 3.1 x 2.3 cm on image 36/7. Prominent mediastinal lymph nodes measuring up to 9 mm in short axis  on image 39/7, without adenopathy by size criteria. Thyroid is grossly unremarkable. Trachea esophagus are grossly unremarkable. Lungs/Pleura: Right  lower lobe calcified granuloma. Bibasilar atelectasis. Scattered bilateral micro nodules measuring 1-2 mm for instance on image 96/4 in the left lower lobe and image 72/4 in the right upper lobe. Musculoskeletal: Peripherally calcified nodule in the right axilla on image 22/3. No acute osseous abnormality. CT ABDOMEN PELVIS FINDINGS Hepatobiliary: Grossly unremarkable noncontrast appearance of the hepatic parenchyma. No hepatomegaly. Gallbladder is distended with some layering hyperdense material representing sludge and tiny stones seen on prior ultrasound. No biliary ductal dilation. Pancreas: Within normal limits. Spleen: Splenomegaly measuring 22 cm in maximum craniocaudal dimension. Adrenals/Urinary Tract: Adrenal glands are unremarkable. Kidneys are normal, without renal calculi, contour deforming lesion, or hydronephrosis. Bladder is unremarkable. Stomach/Bowel: Prior gastric bypass surgery. Enteric contrast visualized to the level of the distal small bowel. No pathologic dilation of small bowel. Appendix is grossly unremarkable. Colonic diverticulosis without findings of acute diverticulitis. Vascular/Lymphatic: Aortic atherosclerosis without aneurysmal dilation. No significant interval change in the enlarged and prominent upper abdominal, retroperitoneal and mesenteric lymph nodes. Interval increase in size of the few enlarged and prominent pelvic lymph nodes. Reference lymph nodes are as follows: Periportal lymph node measuring 3.3 x 1.7 cm on image 54/3, unchanged. Aortocaval lymph node measures 1.6 x 1.5 cm on image 69/3, unchanged. Right pelvic sidewall lymph node measures 3.1 x 1.6 cm on image 109/3. Reproductive: Uterus and bilateral adnexa are unremarkable. Other: Small volume abdominopelvic ascites with diffuse mesenteric stranding. Multiple peritoneal calcifications are again visualized and unchanged from prior, likely sequela prior inflammation. Similar appearance of the nonspecific anterior  abdominal wall subcutaneous calcifications. Mild diffuse subcutaneous edema. Musculoskeletal: Multilevel degenerative changes spine. No acute osseous abnormality. IMPRESSION: 1. Splenomegaly with pathologically enlarged lymph nodes above and below the diaphragm, with overall stable to minimally increased abdominal adenopathy and interval progression of the pelvic adenopathy. 2. Small volume abdominopelvic ascites with diffuse mesenteric stranding. 3. Scattered bilateral pulmonary micro nodules measuring 1-2 mm. 4. Distended gallbladder with some layering hyperdense material representing layering sludge and tiny stones seen on prior ultrasound. 5. Aortic atherosclerosis. Aortic Atherosclerosis (ICD10-I70.0). Electronically Signed   By: Dahlia Bailiff MD   On: 10/29/2020 19:53   CT BIOPSY  Result Date: 10/30/2020 INDICATION: 70 year old female presents for ultrasound-guided right axillary node biopsy EXAM: ULTRASOUND-GUIDED RIGHT AXILLARY NODE BIOPSY MEDICATIONS: None. ANESTHESIA/SEDATION: Moderate (conscious) sedation was employed during this procedure, as the same moderate sedation that was performed during the accession number [id number]. Same sedation time for sequential procedures. A total of Versed 3.0 mg and Fentanyl 125 mcg was administered intravenously. Moderate Sedation Time: 42 minutes. The patient's level of consciousness and vital signs were monitored continuously by radiology nursing throughout the procedure under my direct supervision. FLUOROSCOPY TIME:  ULTRASOUND COMPLICATIONS: NONE PROCEDURE: Informed written consent was obtained from the patient after a thorough discussion of the procedural risks, benefits and alternatives. All questions were addressed. Maximal Sterile Barrier Technique was utilized including caps, mask, sterile gowns, sterile gloves, sterile drape, hand hygiene and skin antiseptic. A timeout was performed prior to the initiation of the procedure. After a bone marrow biopsy  was performed under CT guidance, the patient was turned into the supine position on her stretcher. Ultrasound images of the right axillary region were performed with images stored. The right axillary region was prepped with chlorhexidine in a sterile fashion, and a sterile drape was applied covering the operative field. A sterile gown and sterile gloves  were used for the procedure. Local anesthesia was provided with 1% Lidocaine. Ultrasound guidance was used to infiltrate the region with 1% lidocaine for local anesthesia. Five separate 18 gauge core biopsy were then acquired of the right axillary node using ultrasound guidance. Specimens placed into fresh specimen/saline. Patient tolerated the procedure well and remained hemodynamically stable throughout. No complications were encountered and no significant blood loss was encounter IMPRESSION: Status post ultrasound-guided biopsy of right axillary lymph node. Signed, Dulcy Fanny. Dellia Nims, RPVI [facility] Radiology Electronically Signed   By: Corrie Mckusick D.O.   On: 10/30/2020 11:05   CT BONE MARROW BIOPSY & ASPIRATION  Result Date: 10/30/2020 INDICATION: 70 year old female referred for bone marrow biopsy EXAM: CT BONE MARROW BIOPSY AND ASPIRATION MEDICATIONS: None. ANESTHESIA/SEDATION: Moderate (conscious) sedation was employed during this procedure. A total of Versed 3.0 mg and Fentanyl 125 mcg was administered intravenously. Moderate Sedation Time: 42 minutes. The patient's level of consciousness and vital signs were monitored continuously by radiology nursing throughout the procedure under my direct supervision. FLUOROSCOPY TIME:  CT COMPLICATIONS: None PROCEDURE: The procedure risks, benefits, and alternatives were explained to the patient. Questions regarding the procedure were encouraged and answered. The patient understands and consents to the procedure. Scout CT of the pelvis was performed for surgical  planning purposes. The posterior pelvis was prepped with Chlorhexidine in a sterile fashion, and a sterile drape was applied covering the operative field. A sterile gown and sterile gloves were used for the procedure. Local anesthesia was provided with 1% Lidocaine. Posterior right iliac bone was targeted for biopsy. The skin and subcutaneous tissues were infiltrated with 1% lidocaine without epinephrine. A small stab incision was made with an 11 blade scalpel, and an 11 gauge Murphy needle was advanced with CT guidance to the posterior cortex. Manual forced was used to advance the needle through the posterior cortex and the stylet was removed. A bone marrow aspirate was retrieved and passed to a cytotechnologist in the room. The Murphy needle was then advanced without the stylet for a core biopsy. The core biopsy was retrieved and also passed to a cytotechnologist. Manual pressure was used for hemostasis and a sterile dressing was placed. No complications were encountered no significant blood loss was encountered. Patient tolerated the procedure well and remained hemodynamically stable throughout. After the bone marrow biopsy was completed, the patient was then positioned supine on her stretcher and we proceeded with the next staff of the planned case, a right axillary lymph node ultrasound-guided biopsy. The same sedation time is applied for both procedures. IMPRESSION: Status post CT-guided bone marrow biopsy, with tissue specimen sent to pathology for complete histopathologic analysis Signed, Dulcy Fanny. Earleen Newport, DO [facility] Radiology Electronically Signed   By: Corrie Mckusick D.O.   On: 10/30/2020 11:02   US Abdomen Limited RUQ (LIVER/GB)  Result Date: 10/28/2020 CLINICAL DATA:  Follow-up cirrhosis EXAM: ULTRASOUND ABDOMEN LIMITED RIGHT UPPER QUADRANT COMPARISON:  09/05/2019 FINDINGS: Gallbladder: Gallbladder is well distended with gallbladder sludge within.  Previously seen calculi are noted within the dependent sludge. No wall thickening or pericholecystic fluid is noted. Negative sonographic Murphy's sign is elicited. Common bile duct: Diameter: 2.3 mm. Liver: No focal lesion identified. Within normal limits in parenchymal echogenicity. Portal vein is patent on color Doppler imaging with normal direction of blood flow towards the liver. Other: None. IMPRESSION: Gallbladder sludge and cholelithiasis. No complicating factors are noted. Electronically Signed   By: Elta Guadeloupe  Lukens M.D.   On: 10/28/2020 23:34     ASSESSMENT AND PLAN:  Ms. Remmel is a 70 year old female with history of ITP and lymphadenopathy who presented to the hospital with severe thrombocytopenia and anemia.   1. Severe thrombocytopenia, ITP vs MDS/MPN  2. Normocytic anemia, moderate  3. Lymphadenopathy, reactive?  4. Type 2 diabetes mellitus with polyneuropathy 5.  Hypertension   PLAN -She received Nplate today, and first dose of Rituxan, tolerated well. -Platelet count has been trending up, 18K this morning, has not had plt transfusion for since 6/7.  -moderate anemia is stable, no need blood transfusion unless<7.5 -if her plt stable above 15K, ok to discharge home tomorrow or Saturday, I will arrange CBC at her PCP office on Monday, she is scheduled to see me with lab, Nplate injection and second dose Rituximab in a week in my office. -change prednisone to $RemoveBefor'5mg'[id number]$  tomorrow, tape it off in a week  -I spoke with her daughter and sister at bedside  Thressa Sheller  11/19/2020

## 2020-11-20 ENCOUNTER — Encounter: Payer: Self-pay | Admitting: Hematology

## 2020-11-20 ENCOUNTER — Other Ambulatory Visit: Payer: Self-pay | Admitting: Hematology

## 2020-11-20 DIAGNOSIS — D693 Immune thrombocytopenic purpura: Secondary | ICD-10-CM | POA: Diagnosis not present

## 2020-11-20 LAB — BPAM RBC
Blood Product Expiration Date: 202206292359
Blood Product Expiration Date: 202207032359
Unit Type and Rh: 5100
Unit Type and Rh: 9500

## 2020-11-20 LAB — TYPE AND SCREEN
ABO/RH(D): O POS
Antibody Screen: POSITIVE
DAT, IgG: POSITIVE
Unit division: 0
Unit division: 0

## 2020-11-20 LAB — CBC
HCT: 27.1 % — ABNORMAL LOW (ref 36.0–46.0)
Hemoglobin: 8.4 g/dL — ABNORMAL LOW (ref 12.0–15.0)
MCH: 28.5 pg (ref 26.0–34.0)
MCHC: 31 g/dL (ref 30.0–36.0)
MCV: 91.9 fL (ref 80.0–100.0)
Platelets: 20 10*3/uL — CL (ref 150–400)
RBC: 2.95 MIL/uL — ABNORMAL LOW (ref 3.87–5.11)
RDW: 18.9 % — ABNORMAL HIGH (ref 11.5–15.5)
WBC: 10.8 10*3/uL — ABNORMAL HIGH (ref 4.0–10.5)
nRBC: 0.3 % — ABNORMAL HIGH (ref 0.0–0.2)

## 2020-11-20 LAB — CULTURE, BLOOD (ROUTINE X 2)
Culture: NO GROWTH
Culture: NO GROWTH
Special Requests: ADEQUATE

## 2020-11-20 LAB — GLUCOSE, CAPILLARY: Glucose-Capillary: 114 mg/dL — ABNORMAL HIGH (ref 70–99)

## 2020-11-20 MED ORDER — PREDNISONE 5 MG PO TABS: 5.0000 mg | ORAL_TABLET | Freq: Every day | ORAL | 0 refills | Status: AC

## 2020-11-20 NOTE — Plan of Care (Signed)
  Problem: Clinical Measurements: Goal: Ability to maintain clinical measurements within normal limits will improve 11/20/2020 0957 by Camillia Herter, RN Outcome: Adequate for Discharge 11/20/2020 0731 by Camillia Herter, RN Outcome: Progressing Goal: Will remain free from infection 11/20/2020 0957 by Camillia Herter, RN Outcome: Adequate for Discharge 11/20/2020 0731 by Camillia Herter, RN Outcome: Progressing Goal: Diagnostic test results will improve 11/20/2020 0957 by Camillia Herter, RN Outcome: Adequate for Discharge 11/20/2020 0731 by Camillia Herter, RN Outcome: Progressing Goal: Respiratory complications will improve 11/20/2020 0957 by Camillia Herter, RN Outcome: Adequate for Discharge 11/20/2020 0731 by Camillia Herter, RN Outcome: Progressing Goal: Cardiovascular complication will be avoided 11/20/2020 0957 by Camillia Herter, RN Outcome: Adequate for Discharge 11/20/2020 0731 by Camillia Herter, RN Outcome: Progressing   Problem: Coping: Goal: Level of anxiety will decrease 11/20/2020 0957 by Camillia Herter, RN Outcome: Adequate for Discharge 11/20/2020 0731 by Camillia Herter, RN Outcome: Progressing   Problem: Pain Managment: Goal: General experience of comfort will improve 11/20/2020 0957 by Camillia Herter, RN Outcome: Adequate for Discharge 11/20/2020 0731 by Camillia Herter, RN Outcome: Progressing   Problem: Skin Integrity: Goal: Risk for impaired skin integrity will decrease 11/20/2020 0957 by Camillia Herter, RN Outcome: Adequate for Discharge 11/20/2020 0731 by Camillia Herter, RN Outcome: Progressing

## 2020-11-20 NOTE — Discharge Summary (Signed)
Physician Discharge Summary  Kathleen Gibson OHY:[id number] DOB: 07/16/1950 DOA: 10/28/2020  PCP: Howard Pouch A, DO  Admit date: 10/28/2020 Discharge date: 11/20/2020 30 Day Unplanned Readmission Risk Score    Flowsheet Row ED to Hosp-Admission (Current) from 10/28/2020 in [facility]  30 Day Unplanned Readmission Risk Score (%) 19.23 Filed at 11/20/2020 0400       This score is the patient's risk of an unplanned readmission within 30 days of being discharged (0 -100%). The score is based on dignosis, age, lab data, medications, orders, and past utilization.   Low:  0-14.9   Medium: 15-21.9   High: 22-29.9   Extreme: 30 and above           Admitted From: Home Disposition: Home  Recommendations for Outpatient Follow-up:  Follow up with PCP in 1-2 weeks Follow-up with oncology per their recommended scheduled date. Please obtain BMP/CBC in one week Please follow up with your PCP on the following pending results: Unresulted Labs (From admission, onward)     Start     Ordered   11/18/20 0500  Multiple Myeloma Panel (SPEP&IFE w/QIG)  Tomorrow morning,   R       Question:  Specimen collection method  Answer:  Lab=Lab collect   11/17/20 2146   11/15/20 0356  Prepare Pheresed Platelets  (Adult Blood Administration - Platelets (Pheresed))  Once,   R       Question Answer Comment  Number of Apheresis Units 2 units   Transfusion Indications PLT Count </=10,000/mm   Date/Time blood product needed For transfusion   If emergent release call blood bank Not emergent release      11/15/20 0356   11/10/20 1659  [facility] HLA ABC typing  Once,   R        11/10/20 1659   11/10/20 0000  Type and screen  R       Comments: [name]    11/10/20 1647   11/10/20 0000  CBC ([place] Only)  STAT       Comments: Post Transfusion Labs, 10-60 mins after first unit platelet on 11/11/2020   11/10/20 [place]: None Equipment/Devices:  None  Discharge Condition: Stable CODE STATUS: DNR Diet recommendation: Cardiac  Subjective: Patient seen and examined.  She has no complaints.  She is happy that she is going home today.  Brief/Interim Summary: 70 year old F with PMH of DM-2 with neuropathy, ITP, gastric bypass, HTN, NAFLD and noncompliance presented to ED with severe thrombocytopenia, and admitted for acute on chronic ITP.  Platelet was 5K.  Treated with platelet transfusion (4 apheresis), IVIG, Nplate and oral prednisone with some initial improvement.   Patient had bone marrow and right axillary lymph node biopsy performed on 5/20. Differentials based on bone marrow biopsy include MPN/MDS versus [name]. Cytogenetics and FISH panel pending.  Unfortunately, right axillary biopsy was non diagnostic. Underwent excisional lymph node biopsy of left axillary nodes on 6/1 and pathology of which shows follicular hyperplasia with interfollicular expansion.  Per oncology note, IHC staining returned and findings are overall nonspecific and can be seen in reactive and infectious processes as well as Castleman disease. -Cytogenetics normal. despite of receiving IVIG, Nplate, oral prednisone patient continued to have platelets of less than 5 and she ended up receiving almost 18 units of platelet transfusions.  Patient's case was discussed in tumor board by oncology.  They decided to start patient on rituximab.  She received her first dose yesterday.  Her platelets have started to improve since last 3 days, even before she received her rituximab and currently they are 20.  Oncology has cleared the patient for discharge.  She will receive 3 more doses of rituximab, once per week.  Oncology will arrange that.  She is being discharged on prednisone 5 mg p.o. daily per oncology recommendations.  Low-grade fever: Had low-grade fever of 100.9 on the morning of 11/15/2020.  No signs suggestive of any infection.  Chest x-ray and UA negative.  Blood culture  done.  Monitor closely.  Has remained afebrile since then  Discharge Diagnoses:  Principal Problem:   Acute ITP ([place]) Active Problems:   Type 2 diabetes mellitus with diabetic polyneuropathy, without long-term current use of insulin (HCC)   Diabetic polyneuropathy associated with type 2 diabetes mellitus ([place])   Essential hypertension   B12 deficiency   History of noncompliance with medical treatment   Abdominal lymphadenopathy   Anemia   Mixed hyperlipidemia due to type 2 diabetes mellitus ([place])   Lymphadenopathy    Discharge Instructions  Discharge Instructions     Care order/instruction   Complete by: As directed    Transfuse Parameters   Type and screen   Complete by: As directed    [place]       Allergies as of 11/20/2020       Reactions   Asa [aspirin] Other (See Comments)   Contraindicated d/t low plts   Nsaids Other (See Comments)   Contraindicated d/t low plts        Medication List     TAKE these medications    B-12 1000 MCG Subl Place 1 tablet under the tongue daily.   glucose monitoring kit monitoring kit 1 each by Does not apply route as needed for other. Use as directed   metoprolol succinate 50 MG 24 hr tablet Commonly known as: TOPROL-XL Take 1 tablet (50 mg total) by mouth daily.   mirtazapine 15 MG disintegrating tablet Commonly known as: REMERON SOL-TAB Take 1 tablet (15 mg total) by mouth at bedtime.   predniSONE 5 MG tablet Commonly known as: DELTASONE Take 1 tablet (5 mg total) by mouth daily with breakfast for 7 days.   sitaGLIPtin-metformin 50-1000 MG tablet Commonly known as: JANUMET Take 1 tablet by mouth at bedtime.   Vitamin D (Cholecalciferol) 25 MCG (1000 UT) Caps Take 1,000 Units by mouth daily.   zolpidem 5 MG tablet Commonly known as: AMBIEN Take 1 tablet (5 mg total) by mouth at bedtime.       ASK your doctor about these medications    ferrous sulfate 325 (65 FE) MG tablet Take 1  tablet (325 mg total) by mouth daily with breakfast.   gabapentin 300 MG capsule Commonly known as: NEURONTIN 900 my QHS and 300 mg every 8 hrs in day        Follow-up Information     [name], MD Follow up.   Specialty: General Surgery Why: No follow up needed unless you have a need for your left axillary incision Contact information: [address] [place] [address] [phone]         Howard Pouch A, DO Follow up in 1 week(s).   Specialty: Family Medicine Contact information: [address] [place] [address] [phone]  Allergies  Allergen Reactions   Asa [Aspirin] Other (See Comments)    Contraindicated d/t low plts   Nsaids Other (See Comments)    Contraindicated d/t low plts    Consultations: Oncology   Procedures/Studies: CT ABDOMEN PELVIS WO CONTRAST  Result Date: 10/29/2020 CLINICAL DATA:  Hematologic malignancy, staging. EXAM: CT CHEST, ABDOMEN AND PELVIS WITHOUT CONTRAST TECHNIQUE: Multidetector CT imaging of the chest, abdomen and pelvis was performed following the standard protocol without IV contrast. COMPARISON:  History of MRI diarrhea neck no palpable status chest left at [facility] 7 right-sided than she thickened change ir indications sick concern for insert at the right 6 arm. FINDINGS: CT CHEST FINDINGS Cardiovascular: The aortic and branch vessel atherosclerosis without aneurysmal dilation. Normal size heart. No significant pericardial effusion/thickening. Mediastinum/Nodes: Enlarged supraclavicular, bilateral axillary and left mammary lymph nodes. For reference: Left internal mammary lymph node measures 1.9 by 1.2 cm on image 52/7. Right axillary lymph node measures 3.1 x 2.3 cm on image 36/7. Prominent mediastinal lymph nodes measuring up to 9 mm in short axis on image 39/7, without adenopathy by size criteria. Thyroid is grossly unremarkable. Trachea esophagus are grossly unremarkable. Lungs/Pleura: Right  lower lobe calcified granuloma. Bibasilar atelectasis. Scattered bilateral micro nodules measuring 1-2 mm for instance on image 96/4 in the left lower lobe and image 72/4 in the right upper lobe. Musculoskeletal: Peripherally calcified nodule in the right axilla on image 22/3. No acute osseous abnormality. CT ABDOMEN PELVIS FINDINGS Hepatobiliary: Grossly unremarkable noncontrast appearance of the hepatic parenchyma. No hepatomegaly. Gallbladder is distended with some layering hyperdense material representing sludge and tiny stones seen on prior ultrasound. No biliary ductal dilation. Pancreas: Within normal limits. Spleen: Splenomegaly measuring 22 cm in maximum craniocaudal dimension. Adrenals/Urinary Tract: Adrenal glands are unremarkable. Kidneys are normal, without renal calculi, contour deforming lesion, or hydronephrosis. Bladder is unremarkable. Stomach/Bowel: Prior gastric bypass surgery. Enteric contrast visualized to the level of the distal small bowel. No pathologic dilation of small bowel. Appendix is grossly unremarkable. Colonic diverticulosis without findings of acute diverticulitis. Vascular/Lymphatic: Aortic atherosclerosis without aneurysmal dilation. No significant interval change in the enlarged and prominent upper abdominal, retroperitoneal and mesenteric lymph nodes. Interval increase in size of the few enlarged and prominent pelvic lymph nodes. Reference lymph nodes are as follows: Periportal lymph node measuring 3.3 x 1.7 cm on image 54/3, unchanged. Aortocaval lymph node measures 1.6 x 1.5 cm on image 69/3, unchanged. Right pelvic sidewall lymph node measures 3.1 x 1.6 cm on image 109/3. Reproductive: Uterus and bilateral adnexa are unremarkable. Other: Small volume abdominopelvic ascites with diffuse mesenteric stranding. Multiple peritoneal calcifications are again visualized and unchanged from prior, likely sequela prior inflammation. Similar appearance of the nonspecific anterior  abdominal wall subcutaneous calcifications. Mild diffuse subcutaneous edema. Musculoskeletal: Multilevel degenerative changes spine. No acute osseous abnormality. IMPRESSION: 1. Splenomegaly with pathologically enlarged lymph nodes above and below the diaphragm, with overall stable to minimally increased abdominal adenopathy and interval progression of the pelvic adenopathy. 2. Small volume abdominopelvic ascites with diffuse mesenteric stranding. 3. Scattered bilateral pulmonary micro nodules measuring 1-2 mm. 4. Distended gallbladder with some layering hyperdense material representing layering sludge and tiny stones seen on prior ultrasound. 5. Aortic atherosclerosis. Aortic Atherosclerosis (ICD10-I70.0). Electronically Signed   By: Dahlia Bailiff MD   On: 10/29/2020 19:53   DG Chest 1 View  Result Date: 11/15/2020 CLINICAL DATA:  70 year old female with pneumonia. Hematologic malignancy. EXAM: CHEST  1 VIEW COMPARISON:  Staging CT Chest, Abdomen, and Pelvis 10/29/2020 and  earlier. FINDINGS: Portable AP semi upright view at 0549 hours. Lung volumes and mediastinal contours are stable, within normal limits. Visualized tracheal air column is within normal limits. Mild curvilinear atelectasis projecting over the right hemidiaphragm is stable from last month. Elsewhere Allowing for portable technique the lungs are clear. No pleural effusion. No acute osseous abnormality identified. Paucity of bowel gas in the upper abdomen. IMPRESSION: Right lung base atelectasis is stable from the CT last month. No new cardiopulmonary abnormality identified. Electronically Signed   By: Genevie Ann M.D.   On: 11/15/2020 06:02   CT CHEST WO CONTRAST  Result Date: 10/29/2020 CLINICAL DATA:  Hematologic malignancy, staging. EXAM: CT CHEST, ABDOMEN AND PELVIS WITHOUT CONTRAST TECHNIQUE: Multidetector CT imaging of the chest, abdomen and pelvis was performed following the standard protocol without IV contrast. COMPARISON:  History of  MRI diarrhea neck no palpable status chest left at [facility] 7 right-sided than she thickened change ir indications sick concern for insert at the right 6 arm. FINDINGS: CT CHEST FINDINGS Cardiovascular: The aortic and branch vessel atherosclerosis without aneurysmal dilation. Normal size heart. No significant pericardial effusion/thickening. Mediastinum/Nodes: Enlarged supraclavicular, bilateral axillary and left mammary lymph nodes. For reference: Left internal mammary lymph node measures 1.9 by 1.2 cm on image 52/7. Right axillary lymph node measures 3.1 x 2.3 cm on image 36/7. Prominent mediastinal lymph nodes measuring up to 9 mm in short axis on image 39/7, without adenopathy by size criteria. Thyroid is grossly unremarkable. Trachea esophagus are grossly unremarkable. Lungs/Pleura: Right lower lobe calcified granuloma. Bibasilar atelectasis. Scattered bilateral micro nodules measuring 1-2 mm for instance on image 96/4 in the left lower lobe and image 72/4 in the right upper lobe. Musculoskeletal: Peripherally calcified nodule in the right axilla on image 22/3. No acute osseous abnormality. CT ABDOMEN PELVIS FINDINGS Hepatobiliary: Grossly unremarkable noncontrast appearance of the hepatic parenchyma. No hepatomegaly. Gallbladder is distended with some layering hyperdense material representing sludge and tiny stones seen on prior ultrasound. No biliary ductal dilation. Pancreas: Within normal limits. Spleen: Splenomegaly measuring 22 cm in maximum craniocaudal dimension. Adrenals/Urinary Tract: Adrenal glands are unremarkable. Kidneys are normal, without renal calculi, contour deforming lesion, or hydronephrosis. Bladder is unremarkable. Stomach/Bowel: Prior gastric bypass surgery. Enteric contrast visualized to the level of the distal small bowel. No pathologic dilation of small bowel. Appendix is grossly unremarkable. Colonic diverticulosis without findings of acute diverticulitis. Vascular/Lymphatic: Aortic  atherosclerosis without aneurysmal dilation. No significant interval change in the enlarged and prominent upper abdominal, retroperitoneal and mesenteric lymph nodes. Interval increase in size of the few enlarged and prominent pelvic lymph nodes. Reference lymph nodes are as follows: Periportal lymph node measuring 3.3 x 1.7 cm on image 54/3, unchanged. Aortocaval lymph node measures 1.6 x 1.5 cm on image 69/3, unchanged. Right pelvic sidewall lymph node measures 3.1 x 1.6 cm on image 109/3. Reproductive: Uterus and bilateral adnexa are unremarkable. Other: Small volume abdominopelvic ascites with diffuse mesenteric stranding. Multiple peritoneal calcifications are again visualized and unchanged from prior, likely sequela prior inflammation. Similar appearance of the nonspecific anterior abdominal wall subcutaneous calcifications. Mild diffuse subcutaneous edema. Musculoskeletal: Multilevel degenerative changes spine. No acute osseous abnormality. IMPRESSION: 1. Splenomegaly with pathologically enlarged lymph nodes above and below the diaphragm, with overall stable to minimally increased abdominal adenopathy and interval progression of the pelvic adenopathy. 2. Small volume abdominopelvic ascites with diffuse mesenteric stranding. 3. Scattered bilateral pulmonary micro nodules measuring 1-2 mm. 4. Distended gallbladder with some layering hyperdense material representing layering sludge and tiny  stones seen on prior ultrasound. 5. Aortic atherosclerosis. Aortic Atherosclerosis (ICD10-I70.0). Electronically Signed   By: Dahlia Bailiff MD   On: 10/29/2020 19:53   CT BIOPSY  Result Date: 10/30/2020 INDICATION: 70 year old female presents for ultrasound-guided right axillary node biopsy EXAM: ULTRASOUND-GUIDED RIGHT AXILLARY NODE BIOPSY MEDICATIONS: None. ANESTHESIA/SEDATION: Moderate (conscious) sedation was employed during this procedure, as the same moderate sedation that was performed during the accession number  [id number]. Same sedation time for sequential procedures. A total of Versed 3.0 mg and Fentanyl 125 mcg was administered intravenously. Moderate Sedation Time: 42 minutes. The patient's level of consciousness and vital signs were monitored continuously by radiology nursing throughout the procedure under my direct supervision. FLUOROSCOPY TIME:  ULTRASOUND COMPLICATIONS: NONE PROCEDURE: Informed written consent was obtained from the patient after a thorough discussion of the procedural risks, benefits and alternatives. All questions were addressed. Maximal Sterile Barrier Technique was utilized including caps, mask, sterile gowns, sterile gloves, sterile drape, hand hygiene and skin antiseptic. A timeout was performed prior to the initiation of the procedure. After a bone marrow biopsy was performed under CT guidance, the patient was turned into the supine position on her stretcher. Ultrasound images of the right axillary region were performed with images stored. The right axillary region was prepped with chlorhexidine in a sterile fashion, and a sterile drape was applied covering the operative field. A sterile gown and sterile gloves were used for the procedure. Local anesthesia was provided with 1% Lidocaine. Ultrasound guidance was used to infiltrate the region with 1% lidocaine for local anesthesia. Five separate 18 gauge core biopsy were then acquired of the right axillary node using ultrasound guidance. Specimens placed into fresh specimen/saline. Patient tolerated the procedure well and remained hemodynamically stable throughout. No complications were encountered and no significant blood loss was encounter IMPRESSION: Status post ultrasound-guided biopsy of right axillary lymph node. Signed, Dulcy Fanny. Dellia Nims, RPVI [facility] Radiology Electronically Signed   By: Corrie Mckusick D.O.   On: 10/30/2020 11:05   CT BONE MARROW BIOPSY & ASPIRATION  Result  Date: 10/30/2020 INDICATION: 70 year old female referred for bone marrow biopsy EXAM: CT BONE MARROW BIOPSY AND ASPIRATION MEDICATIONS: None. ANESTHESIA/SEDATION: Moderate (conscious) sedation was employed during this procedure. A total of Versed 3.0 mg and Fentanyl 125 mcg was administered intravenously. Moderate Sedation Time: 42 minutes. The patient's level of consciousness and vital signs were monitored continuously by radiology nursing throughout the procedure under my direct supervision. FLUOROSCOPY TIME:  CT COMPLICATIONS: None PROCEDURE: The procedure risks, benefits, and alternatives were explained to the patient. Questions regarding the procedure were encouraged and answered. The patient understands and consents to the procedure. Scout CT of the pelvis was performed for surgical planning purposes. The posterior pelvis was prepped with Chlorhexidine in a sterile fashion, and a sterile drape was applied covering the operative field. A sterile gown and sterile gloves were used for the procedure. Local anesthesia was provided with 1% Lidocaine. Posterior right iliac bone was targeted for biopsy. The skin and subcutaneous tissues were infiltrated with 1% lidocaine without epinephrine. A small stab incision was made with an 11 blade scalpel, and an 11 gauge Murphy needle was advanced with CT guidance to the posterior cortex. Manual forced was used to advance the needle through the posterior cortex and the stylet was removed. A bone marrow aspirate was retrieved and passed to a cytotechnologist in the room. The Murphy needle was then advanced without the stylet for a core biopsy. The  core biopsy was retrieved and also passed to a cytotechnologist. Manual pressure was used for hemostasis and a sterile dressing was placed. No complications were encountered no significant blood loss was encountered. Patient tolerated the procedure well and remained hemodynamically stable throughout. After the bone marrow biopsy was  completed, the patient was then positioned supine on her stretcher and we proceeded with the next staff of the planned case, a right axillary lymph node ultrasound-guided biopsy. The same sedation time is applied for both procedures. IMPRESSION: Status post CT-guided bone marrow biopsy, with tissue specimen sent to pathology for complete histopathologic analysis Signed, Dulcy Fanny. Earleen Newport, DO [facility] Radiology Electronically Signed   By: Corrie Mckusick D.O.   On: 10/30/2020 11:02   US Abdomen Limited RUQ (LIVER/GB)  Result Date: 10/28/2020 CLINICAL DATA:  Follow-up cirrhosis EXAM: ULTRASOUND ABDOMEN LIMITED RIGHT UPPER QUADRANT COMPARISON:  09/05/2019 FINDINGS: Gallbladder: Gallbladder is well distended with gallbladder sludge within. Previously seen calculi are noted within the dependent sludge. No wall thickening or pericholecystic fluid is noted. Negative sonographic Murphy's sign is elicited. Common bile duct: Diameter: 2.3 mm. Liver: No focal lesion identified. Within normal limits in parenchymal echogenicity. Portal vein is patent on color Doppler imaging with normal direction of blood flow towards the liver. Other: None. IMPRESSION: Gallbladder sludge and cholelithiasis. No complicating factors are noted. Electronically Signed   By: Inez Catalina M.D.   On: 10/28/2020 23:34     Discharge Exam: Vitals:   11/20/20 0558 11/20/20 0952  BP: 129/63 115/70  Pulse: 85 88  Resp: 18 18  Temp: 98.3 F (36.8 C) 98.3 F (36.8 C)  SpO2: 96% 95%   Vitals:   11/19/20 1629 11/19/20 2034 11/20/20 0558 11/20/20 0952  BP: 111/69 118/62 129/63 115/70  Pulse: 87 91 85 88  Resp: $Remo'18 18 18 18  'yBUGj$ Temp: 99.4 F (37.4 C) 98 F (36.7 C) 98.3 F (36.8 C) 98.3 F (36.8 C)  TempSrc: Oral Oral Oral Oral  SpO2: 97% 95% 96% 95%  Weight:      Height:        General: Pt is alert, awake, not in acute distress Cardiovascular: RRR, S1/S2 +, no rubs, no  gallops Respiratory: CTA bilaterally, no wheezing, no rhonchi Abdominal: Soft, NT, ND, bowel sounds + Extremities: no edema, no cyanosis, multiple bruises in upper extremities    The results of significant diagnostics from this hospitalization (including imaging, microbiology, ancillary and laboratory) are listed below for reference.     Microbiology: Recent Results (from the past 240 hour(s))  Culture, Urine     Status: Abnormal   Collection Time: 11/15/20  5:08 AM   Specimen: Urine, Random  Result Value Ref Range Status   Specimen Description URINE, RANDOM  Final   Special Requests   Final    NONE Performed at [facility], [address], [place], [place] [address]    Culture MULTIPLE SPECIES PRESENT, SUGGEST RECOLLECTION (A)  Final   Report Status 11/16/2020 FINAL  Final  Culture, blood (routine x 2)     Status: None   Collection Time: 11/15/20  5:48 AM   Specimen: BLOOD  Result Value Ref Range Status   Specimen Description BLOOD LEFT ARM  Final   Special Requests   Final    BOTTLES DRAWN AEROBIC AND ANAEROBIC Blood Culture results may not be optimal due to an inadequate volume of blood received in culture bottles   Culture   Final  NO GROWTH 5 DAYS Performed at [facility], [place] [address] [place], [place] [address]    Report Status 11/20/2020 FINAL  Final  Culture, blood (routine x 2)     Status: None   Collection Time: 11/15/20  5:48 AM   Specimen: BLOOD  Result Value Ref Range Status   Specimen Description BLOOD RIGHT ARM  Final   Special Requests   Final    BOTTLES DRAWN AEROBIC AND ANAEROBIC Blood Culture adequate volume   Culture   Final    NO GROWTH 5 DAYS Performed at [facility], [address] [place], [place] [address]    Report Status 11/20/2020 FINAL  Final     Labs: BNP (last 3 results) No results for input(s): BNP in the last 8760 hours. Basic Metabolic Panel: Recent Labs  Lab 11/16/20 1455 11/19/20 0141  NA 134* 134*   K 4.2 4.0  CL 104 104  CO2 26 24  GLUCOSE 154* 90  BUN 13 19  CREATININE 0.77 1.01*  CALCIUM 8.3* 8.2*   Liver Function Tests: Recent Labs  Lab 11/19/20 0141  AST 22  ALT 16  ALKPHOS 54  BILITOT 0.7  PROT 8.5*  ALBUMIN 2.5*   No results for input(s): LIPASE, AMYLASE in the last 168 hours. No results for input(s): AMMONIA in the last 168 hours. CBC: Recent Labs  Lab 11/17/20 0358 11/17/20 1516 11/18/20 0544 11/19/20 0141 11/20/20 0522  WBC 13.7* 16.6* 12.8* 13.1* 10.8*  HGB 7.8* 8.6* 8.0* 8.1* 8.4*  HCT 26.2* 29.1* 26.7* 26.8* 27.1*  MCV 94.2 94.5 92.7 94.0 91.9  PLT <5* 9* 14* 18* 20*   Cardiac Enzymes: No results for input(s): CKTOTAL, CKMB, CKMBINDEX, TROPONINI in the last 168 hours. BNP: Invalid input(s): POCBNP CBG: Recent Labs  Lab 11/19/20 0656 11/19/20 1150 11/19/20 1630 11/19/20 2034 11/20/20 0649  GLUCAP 108* 103* 107* 185* 114*   D-Dimer No results for input(s): DDIMER in the last 72 hours. Hgb A1c No results for input(s): HGBA1C in the last 72 hours. Lipid Profile No results for input(s): CHOL, HDL, LDLCALC, TRIG, CHOLHDL, LDLDIRECT in the last 72 hours. Thyroid function studies No results for input(s): TSH, T4TOTAL, T3FREE, THYROIDAB in the last 72 hours.  Invalid input(s): FREET3 Anemia work up No results for input(s): VITAMINB12, FOLATE, FERRITIN, TIBC, IRON, RETICCTPCT in the last 72 hours. Urinalysis    Component Value Date/Time   COLORURINE YELLOW 11/15/2020 0508   APPEARANCEUR CLEAR 11/15/2020 0508   LABSPEC 1.011 11/15/2020 0508   PHURINE 7.0 11/15/2020 0508   GLUCOSEU NEGATIVE 11/15/2020 0508   HGBUR NEGATIVE 11/15/2020 0508   BILIRUBINUR NEGATIVE 11/15/2020 0508   KETONESUR NEGATIVE 11/15/2020 0508   PROTEINUR NEGATIVE 11/15/2020 0508   NITRITE NEGATIVE 11/15/2020 0508   LEUKOCYTESUR NEGATIVE 11/15/2020 0508   Sepsis Labs Invalid input(s): PROCALCITONIN,  WBC,  LACTICIDVEN Microbiology Recent Results (from the past  240 hour(s))  Culture, Urine     Status: Abnormal   Collection Time: 11/15/20  5:08 AM   Specimen: Urine, Random  Result Value Ref Range Status   Specimen Description URINE, RANDOM  Final   Special Requests   Final    NONE Performed at [facility], [address] [place],  [address]    Culture MULTIPLE SPECIES PRESENT, SUGGEST RECOLLECTION (A)  Final   Report Status 11/16/2020 FINAL  Final  Culture, blood (routine x 2)     Status: None   Collection Time: 11/15/20  5:48 AM  Specimen: BLOOD  Result Value Ref Range Status   Specimen Description BLOOD LEFT ARM  Final   Special Requests   Final    BOTTLES DRAWN AEROBIC AND ANAEROBIC Blood Culture results may not be optimal due to an inadequate volume of blood received in culture bottles   Culture   Final    NO GROWTH 5 DAYS Performed at [facility], [place] [address], [place], [place] [address]    Report Status 11/20/2020 FINAL  Final  Culture, blood (routine x 2)     Status: None   Collection Time: 11/15/20  5:48 AM   Specimen: BLOOD  Result Value Ref Range Status   Specimen Description BLOOD RIGHT ARM  Final   Special Requests   Final    BOTTLES DRAWN AEROBIC AND ANAEROBIC Blood Culture adequate volume   Culture   Final    NO GROWTH 5 DAYS Performed at [facility], [address] [place], [place] [address]    Report Status 11/20/2020 FINAL  Final     Time coordinating discharge: Over 30 minutes  SIGNED:   Darliss Cheney, MD  [facility] 11/20/2020, 10:12 AM  If 7PM-7AM, please contact night-coverage [email or web address]

## 2020-11-20 NOTE — Care Management Important Message (Signed)
Important Message  Patient Details  Name: Kathleen Gibson MRN: [id number] Date of Birth: Mar 06, 1951   Medicare Important Message Given:  Yes - Important Message mailed due to current National Emergency  Verbal consent obtained due to current National Emergency  Relationship to patient: Self Contact Name: Doriana Call Date: 11/20/20  Time: 1657 Phone: [phone] Outcome: No Answer/Busy Important Message mailed to: Patient address on file    Delorse Lek 11/20/2020, 10:15 AM

## 2020-11-20 NOTE — Progress Notes (Signed)
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 Tapering off in 3 weeks   INTERVAL HISTORY:  Kathleen MALLOZZIis here for a follow up. She presents to the clinic alone.  She had a prolonged hospital stay for severe thrombocytopenia, and started Rituxan infusion last Thursday.  She was discharged home last Friday, has been doing well overall at home.  Still has moderate fatigue, able to function at home.  Denies any new bruising or bleeding, appetite is fair.  Weight is stable.  No fever chills or other new complaints.  All other systems were reviewed with the patient and are negative.  MEDICAL HISTORY:  Past Medical History:  Diagnosis Date   [place]    HLD (hyperlipidemia)    Hypertension    Pernicious anemia    Pneumonia 09/05/2019   Renal insufficiency 09/06/2019   Vitamin D deficiency     SURGICAL HISTORY: Past Surgical History:  Procedure Laterality Date   ABDOMINAL HERNIA REPAIR  2009   ABDOMINOPLASTY     AXILLARY LYMPH NODE BIOPSY Left 11/11/2020   Procedure: LEFT AXILLARY EXCISIONAL LYMPH NODE BIOPSY;  Surgeon: GArmandina Gemma MD;  [facility];  Service: General;  Laterality: Left;   CSan Sebastian  hemorroid surgery  2007   TBronx   I have reviewed the social history and  family history with the patient and they are unchanged from previous note.  ALLERGIES:  is allergic to asa [aspirin] and nsaids.  MEDICATIONS:  Current Outpatient Medications  Medication Sig Dispense Refill   Cyanocobalamin (B-12) 1000 MCG SUBL Place 1 tablet under the tongue daily. 90 tablet 3   ferrous sulfate 325 (65 FE) MG tablet Take 1 tablet (325 mg total) by mouth daily with breakfast. (Patient taking differently: Take 650 mg by mouth daily with breakfast.) 90 tablet 3   gabapentin (NEURONTIN) 300 MG capsule 900 my QHS and 300 mg every 8 hrs in day (Patient taking differently: Take 600 mg by mouth 2 (two) times daily.) 450 capsule 1   glucose monitoring kit (FREESTYLE) monitoring kit 1 each by Does not apply route as needed for other. Use as directed 1 each 0   metoprolol succinate (TOPROL-XL) 50 MG 24 hr tablet Take 1 tablet (50 mg total) by mouth daily. 90 tablet 1   mirtazapine (REMERON SOL-TAB) 15 MG disintegrating tablet Take 1 tablet (15 mg total) by mouth at bedtime. 90 tablet 1   predniSONE (DELTASONE) 5 MG tablet Take 1 tablet (5 mg total) by mouth daily with breakfast for 7 days. 7 tablet 0   sitaGLIPtin-metformin (JANUMET) 50-1000 MG tablet Take 1 tablet by mouth at bedtime. 90 tablet 1   Vitamin D, Cholecalciferol, 25 MCG (1000 UT) CAPS Take 1,000 Units by mouth daily.      zolpidem (AMBIEN) 5 MG tablet Take 1 tablet (5 mg total) by  mouth at bedtime. 90 tablet 0   No current facility-administered medications for this visit.    PHYSICAL EXAMINATION: ECOG PERFORMANCE STATUS: 2 - Symptomatic, <50% confined to bed  Vitals:   11/25/20 0807  BP: 140/70  Pulse: (!) 110  Resp: 17  Temp: 97.9 F (36.6 C)  SpO2: 99%   Filed Weights   11/25/20 0807  Weight: 163 lb 14.4 oz (74.3 kg)    GENERAL:alert, no distress and comfortable SKIN: skin color, texture, turgor are normal, no rashes or significant lesions except resolving ecchymosis on bilateral forearms EYES: normal,  Conjunctiva are pink and non-injected, sclera clear Musculoskeletal:no cyanosis of digits and no clubbing  NEURO: alert & oriented x 3 with fluent speech, no focal motor/sensory deficits  LABORATORY DATA:  I have reviewed the data as listed CBC Latest Ref Rng & Units 11/25/2020 11/20/2020 11/19/2020  WBC 4.0 - 10.5 K/uL 7.7 10.8(H) 13.1(H)  Hemoglobin 12.0 - 15.0 g/dL 8.8(L) 8.4(L) 8.1(L)  Hematocrit 36.0 - 46.0 % 29.3(L) 27.1(L) 26.8(L)  Platelets 150 - 400 K/uL 6(LL) 20(LL) 18(LL)     CMP Latest Ref Rng & Units 11/25/2020 11/19/2020 11/16/2020  Glucose 70 - 99 mg/dL 145(H) 90 154(H)  BUN 8 - 23 mg/dL _0 Creatinine 0.44 - 1.00 mg/dL 0.81 1.01(H) 0.77  Sodium 135 - 145 mmol/L 139 134(L) 134(L)  Potassium 3.5 - 5.1 mmol/L 4.2 4.0 4.2  Chloride 98 - 111 mmol/L 107 104 104  CO2 22 - 32 mmol/L _1 Calcium 8.9 - 10.3 mg/dL 8.4(L) 8.2(L) 8.3(L)  Total Protein 6.5 - 8.1 g/dL 9.1(H) 8.5(H) -  Total Bilirubin 0.3 - 1.2 mg/dL 0.7 0.7 -  Alkaline Phos 38 - 126 U/L 66 54 -  AST 15 - 41 U/L 15 22 -  ALT 0 - 44 U/L 9 16 -      RADIOGRAPHIC STUDIES: I have personally reviewed the radiological images as listed and agreed with the findings in the report. No results found.   ASSESSMENT & PLAN:  Kathleen Gibson is a 70 y.o. female with    1. Severe thrombocytopenia, ITP vs MDS/MPN vs [name], Splenomegaly  -She was initially diagnosed with ITP in 2014. She lost follow up after 11/2017 and did not proceed with then recommended bone marrow biopsy.  -With recent labs of 4K on ED visit. During hospitalization she was treated with platelet transfusions, IVIG, oral prednisone 69m and Nplate injections. She is now tapering off prednisone given little response.  -I discussed her splenomegaly has played a role in her poor response to platelet transfusions as well. Given recurrent transfusions, platelet transfusion will be limited to 1 unit/day if plt<5K, if no significant bleeding. -I started her on  chemo treatment with weekly Rituxan X4 on 11/19/20 to treat her severe refractory thrombocytopenia.  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  However her diagnosis has been difficult, especially the exact etiology of severe thrombocytopenia and lymphadenopathy. [facility], she will think about it. -We will continue Nplate weekly, next dose due tomorrow, will increase dose to 670m/kg tomorrow  -We will proceed to second dose Rituxan tomorrow in office -We will continue platelet transfusion if plt<6K, one unit tomorrow, will call blood bank to find a matched platelet if possible -The MPN next January sequencing panel still pending -Follow-up in 1 week   2. Normocytic anemia, moderate  -she has  a history of pernicious anemia/vitamin B12 deficiency -with recent severe thrombocytopenia, she has had loq ferritin and iron study with moderate anemia. She required blood transfusion in hospital. Will give blood transfusion for Hg <=7.5.  -Hemoglobin 8.8 today, overall stable  3. Lymphadenopathy, reactive?  -Her 09/05/19 CT Scan showed findings are concerning for neoplastic process given interval worsening adenopathy over the upper abdomen, mesenteric, periaortic region, and iliac chains compared to 2017 scan. -Her 10/30/20 LN biopsy was negative for malignant cells, but sample was limited in the flow cytometry was not able to obtain. Her surgical lymph node biopsy on 11/11/20  was negative for lymphoma and showed follicular hyperplasia, this is likely a reactive process.    4. Type 2 diabetes mellitus with polyneuropathy, Hypertension -Continue medication, she will follow-up with her family doctor this Friday    PLAN:  -lab reviewed -one unit plt tomorrow, second dose Rituxan tomorrow -Lab and 1 unit platelet (crossmatched if possible) every Monday, Wednesday and Friday for next 2 weeks -Follow-up in  1 week      No problem-specific Assessment & Plan notes found for this encounter.   Orders Placed This Encounter  Procedures   CMP ([facility] only)    Standing Status:   Standing    Number of Occurrences:   30    Standing Expiration Date:   11/25/2021   All questions were answered. The patient knows to call the clinic with any problems, questions or concerns. No barriers to learning was detected. The total time spent in the appointment was 30 minutes.     Truitt Merle, MD 11/25/2020   I, Joslyn Devon, am acting as scribe for Truitt Merle, MD.   I have reviewed the above documentation for accuracy and completeness, and I agree with the above.

## 2020-11-20 NOTE — Plan of Care (Signed)
  Problem: Clinical Measurements: Goal: Ability to maintain clinical measurements within normal limits will improve Outcome: Progressing Goal: Will remain free from infection Outcome: Progressing Goal: Diagnostic test results will improve Outcome: Progressing Goal: Respiratory complications will improve Outcome: Progressing Goal: Cardiovascular complication will be avoided Outcome: Progressing   Problem: Coping: Goal: Level of anxiety will decrease Outcome: Progressing   Problem: Pain Managment: Goal: General experience of comfort will improve Outcome: Progressing   Problem: Skin Integrity: Goal: Risk for impaired skin integrity will decrease Outcome: Progressing

## 2020-11-23 LAB — MULTIPLE MYELOMA PANEL, SERUM
Albumin SerPl Elph-Mcnc: 3.1 g/dL (ref 2.9–4.4)
Albumin/Glob SerPl: 0.6 — ABNORMAL LOW (ref 0.7–1.7)
Alpha 1: 0.2 g/dL (ref 0.0–0.4)
Alpha2 Glob SerPl Elph-Mcnc: 0.4 g/dL (ref 0.4–1.0)
B-Globulin SerPl Elph-Mcnc: 0.8 g/dL (ref 0.7–1.3)
Gamma Glob SerPl Elph-Mcnc: 4.5 g/dL — ABNORMAL HIGH (ref 0.4–1.8)
Globulin, Total: 5.8 g/dL — ABNORMAL HIGH (ref 2.2–3.9)
IgA: 350 mg/dL (ref 87–352)
IgG (Immunoglobin G), Serum: 4852 mg/dL — ABNORMAL HIGH (ref 586–1602)
IgM (Immunoglobulin M), Srm: 84 mg/dL (ref 26–217)
Total Protein ELP: 8.9 g/dL — ABNORMAL HIGH (ref 6.0–8.5)

## 2020-11-24 ENCOUNTER — Other Ambulatory Visit: Payer: Self-pay

## 2020-11-24 NOTE — Telephone Encounter (Signed)
Pt was not aware of change. She thought that she was supposed to be taking both. Pt states that she just got the new Rx and will stop the [place]. Pt is scheduled for Hosp F/U on 11/27/20 and has 6 wk f/u on 12/09/20

## 2020-11-24 NOTE — Telephone Encounter (Signed)
Received refill request for Ambien.  Please remind patient she was discontinuing Ambien and she was starting on the mirtazapine tablet about 1 hour before bed to help with sleep.  We discussed this at her last visit and this medication was prescribed to her during that visit. She has started the mirtazapine tablet, but is not sleeping well on it, then increase to 2 of the mirtazapine tablets before bed.  We will discuss further at her upcoming appointment.

## 2020-11-24 NOTE — Telephone Encounter (Signed)
Requesting:zolpidem (AMBIEN) 5 MG tablet Contract:10/28/20 UDS:n/a Last Visit:10/28/20 Next Visit:12/09/20 Last Refill:10/09/20 (90,0)  Please Advise

## 2020-11-25 ENCOUNTER — Inpatient Hospital Stay: Payer: Medicare HMO | Attending: Hematology | Admitting: Hematology

## 2020-11-25 ENCOUNTER — Other Ambulatory Visit: Payer: Self-pay

## 2020-11-25 ENCOUNTER — Encounter: Payer: Self-pay | Admitting: Hematology

## 2020-11-25 ENCOUNTER — Telehealth: Payer: Self-pay

## 2020-11-25 ENCOUNTER — Inpatient Hospital Stay: Payer: Medicare HMO

## 2020-11-25 VITALS — BP 140/70 | HR 110 | Temp 97.9°F | Resp 17 | Ht 63.0 in | Wt 163.9 lb

## 2020-11-25 DIAGNOSIS — E119 Type 2 diabetes mellitus without complications: Secondary | ICD-10-CM | POA: Diagnosis not present

## 2020-11-25 DIAGNOSIS — D510 Vitamin B12 deficiency anemia due to intrinsic factor deficiency: Secondary | ICD-10-CM | POA: Diagnosis not present

## 2020-11-25 DIAGNOSIS — Z5112 Encounter for antineoplastic immunotherapy: Secondary | ICD-10-CM | POA: Diagnosis present

## 2020-11-25 DIAGNOSIS — I10 Essential (primary) hypertension: Secondary | ICD-10-CM | POA: Diagnosis not present

## 2020-11-25 DIAGNOSIS — D693 Immune thrombocytopenic purpura: Secondary | ICD-10-CM

## 2020-11-25 DIAGNOSIS — D649 Anemia, unspecified: Secondary | ICD-10-CM | POA: Insufficient documentation

## 2020-11-25 DIAGNOSIS — Z79899 Other long term (current) drug therapy: Secondary | ICD-10-CM | POA: Insufficient documentation

## 2020-11-25 DIAGNOSIS — R1012 Left upper quadrant pain: Secondary | ICD-10-CM | POA: Insufficient documentation

## 2020-11-25 LAB — CMP (CANCER CENTER ONLY)
ALT: 9 U/L (ref 0–44)
AST: 15 U/L (ref 15–41)
Albumin: 3 g/dL — ABNORMAL LOW (ref 3.5–5.0)
Alkaline Phosphatase: 66 U/L (ref 38–126)
Anion gap: 6 (ref 5–15)
BUN: 14 mg/dL (ref 8–23)
CO2: 26 mmol/L (ref 22–32)
Calcium: 8.4 mg/dL — ABNORMAL LOW (ref 8.9–10.3)
Chloride: 107 mmol/L (ref 98–111)
Creatinine: 0.81 mg/dL (ref 0.44–1.00)
GFR, Estimated: >60 mL/min (ref >60)
Glucose, Bld: 145 mg/dL — ABNORMAL HIGH (ref 70–99)
Potassium: 4.2 mmol/L (ref 3.5–5.1)
Sodium: 139 mmol/L (ref 135–145)
Total Bilirubin: 0.7 mg/dL (ref 0.3–1.2)
Total Protein: 9.1 g/dL — ABNORMAL HIGH (ref 6.5–8.1)

## 2020-11-25 LAB — CBC (CANCER CENTER ONLY)
HCT: 29.3 % — ABNORMAL LOW (ref 36.0–46.0)
Hemoglobin: 8.8 g/dL — ABNORMAL LOW (ref 12.0–15.0)
MCH: 28.2 pg (ref 26.0–34.0)
MCHC: 30 g/dL (ref 30.0–36.0)
MCV: 93.9 fL (ref 80.0–100.0)
Platelet Count: 6 10*3/uL — CL (ref 150–400)
RBC: 3.12 MIL/uL — ABNORMAL LOW (ref 3.87–5.11)
RDW: 18.6 % — ABNORMAL HIGH (ref 11.5–15.5)
WBC Count: 7.7 10*3/uL (ref 4.0–10.5)
nRBC: 0.3 % — ABNORMAL HIGH (ref 0.0–0.2)

## 2020-11-25 NOTE — Telephone Encounter (Signed)
CRITICAL VALUE: Platelets 6 and HGB 8.8   DATE & TIME NOTIFIED: 6/15 9:08am  This nurse spoke with MD and made aware of critical value. Acknowledged understanding.  No further questions at this time.

## 2020-11-26 ENCOUNTER — Encounter (HOSPITAL_COMMUNITY): Payer: Self-pay

## 2020-11-26 ENCOUNTER — Inpatient Hospital Stay: Payer: Medicare HMO

## 2020-11-26 ENCOUNTER — Telehealth: Payer: Self-pay

## 2020-11-26 VITALS — BP 108/52 | HR 77 | Temp 97.8°F | Resp 18

## 2020-11-26 DIAGNOSIS — D693 Immune thrombocytopenic purpura: Secondary | ICD-10-CM

## 2020-11-26 DIAGNOSIS — D510 Vitamin B12 deficiency anemia due to intrinsic factor deficiency: Secondary | ICD-10-CM | POA: Diagnosis not present

## 2020-11-26 DIAGNOSIS — I10 Essential (primary) hypertension: Secondary | ICD-10-CM | POA: Diagnosis not present

## 2020-11-26 DIAGNOSIS — Z5112 Encounter for antineoplastic immunotherapy: Secondary | ICD-10-CM | POA: Diagnosis not present

## 2020-11-26 DIAGNOSIS — D649 Anemia, unspecified: Secondary | ICD-10-CM | POA: Diagnosis not present

## 2020-11-26 DIAGNOSIS — R1012 Left upper quadrant pain: Secondary | ICD-10-CM | POA: Diagnosis not present

## 2020-11-26 DIAGNOSIS — D696 Thrombocytopenia, unspecified: Secondary | ICD-10-CM

## 2020-11-26 DIAGNOSIS — E119 Type 2 diabetes mellitus without complications: Secondary | ICD-10-CM | POA: Diagnosis not present

## 2020-11-26 DIAGNOSIS — Z79899 Other long term (current) drug therapy: Secondary | ICD-10-CM | POA: Diagnosis not present

## 2020-11-26 LAB — CMP (CANCER CENTER ONLY)
ALT: 13 U/L (ref 0–44)
AST: 17 U/L (ref 15–41)
Albumin: 3.1 g/dL — ABNORMAL LOW (ref 3.5–5.0)
Alkaline Phosphatase: 57 U/L (ref 38–126)
Anion gap: 5 (ref 5–15)
BUN: 14 mg/dL (ref 8–23)
CO2: 28 mmol/L (ref 22–32)
Calcium: 8.6 mg/dL — ABNORMAL LOW (ref 8.9–10.3)
Chloride: 104 mmol/L (ref 98–111)
Creatinine: 0.77 mg/dL (ref 0.44–1.00)
GFR, Estimated: >60 mL/min (ref >60)
Glucose, Bld: 191 mg/dL — ABNORMAL HIGH (ref 70–99)
Potassium: 4.5 mmol/L (ref 3.5–5.1)
Sodium: 137 mmol/L (ref 135–145)
Total Bilirubin: 0.5 mg/dL (ref 0.3–1.2)
Total Protein: 9.2 g/dL — ABNORMAL HIGH (ref 6.5–8.1)

## 2020-11-26 LAB — CBC WITH DIFFERENTIAL (CANCER CENTER ONLY)
Abs Immature Granulocytes: 1.02 10*3/uL — ABNORMAL HIGH (ref 0.00–0.07)
Basophils Absolute: 0 10*3/uL (ref 0.0–0.1)
Basophils Relative: 0 %
Eosinophils Absolute: 0 10*3/uL (ref 0.0–0.5)
Eosinophils Relative: 0 %
HCT: 29.7 % — ABNORMAL LOW (ref 36.0–46.0)
Hemoglobin: 9.1 g/dL — ABNORMAL LOW (ref 12.0–15.0)
Immature Granulocytes: 12 %
Lymphocytes Relative: 10 %
Lymphs Abs: 0.9 10*3/uL (ref 0.7–4.0)
MCH: 28.4 pg (ref 26.0–34.0)
MCHC: 30.6 g/dL (ref 30.0–36.0)
MCV: 92.8 fL (ref 80.0–100.0)
Monocytes Absolute: 2.4 10*3/uL — ABNORMAL HIGH (ref 0.1–1.0)
Monocytes Relative: 28 %
Neutro Abs: 4.3 10*3/uL (ref 1.7–7.7)
Neutrophils Relative %: 50 %
Platelet Count: <5 10*3/uL — CL (ref 150–400)
RBC: 3.2 MIL/uL — ABNORMAL LOW (ref 3.87–5.11)
RDW: 18.3 % — ABNORMAL HIGH (ref 11.5–15.5)
WBC Count: 8.6 10*3/uL (ref 4.0–10.5)
nRBC: 0.3 % — ABNORMAL HIGH (ref 0.0–0.2)

## 2020-11-26 LAB — TYPE AND SCREEN
ABO/RH(D): O POS
Antibody Screen: POSITIVE
DAT, IgG: POSITIVE

## 2020-11-26 MED ORDER — DIPHENHYDRAMINE HCL 25 MG PO CAPS
ORAL_CAPSULE | ORAL | Status: AC
  Filled 2020-11-26: qty 2

## 2020-11-26 MED ORDER — ACETAMINOPHEN 325 MG PO TABS
ORAL_TABLET | ORAL | Status: AC
  Filled 2020-11-26: qty 2

## 2020-11-26 MED ORDER — ROMIPLOSTIM INJECTION 500 MCG
6.0000 ug/kg | Freq: Once | SUBCUTANEOUS | Status: AC
Start: 2020-11-26 — End: 2020-11-26
  Administered 2020-11-26: 445 ug via SUBCUTANEOUS
  Filled 2020-11-26: qty 0.89

## 2020-11-26 MED ORDER — SODIUM CHLORIDE 0.9 % IV SOLN
Freq: Once | INTRAVENOUS | Status: AC
  Filled 2020-11-26: qty 250

## 2020-11-26 MED ORDER — ACETAMINOPHEN 325 MG PO TABS
650.0000 mg | ORAL_TABLET | Freq: Once | ORAL | Status: AC
  Administered 2020-11-26: 650 mg via ORAL

## 2020-11-26 MED ORDER — DIPHENHYDRAMINE HCL 25 MG PO CAPS
50.0000 mg | ORAL_CAPSULE | Freq: Once | ORAL | Status: AC
  Administered 2020-11-26: 50 mg via ORAL

## 2020-11-26 MED ORDER — SODIUM CHLORIDE 0.9% IV SOLUTION
250.0000 mL | Freq: Once | INTRAVENOUS | Status: DC
  Filled 2020-11-26: qty 250

## 2020-11-26 MED ORDER — SODIUM CHLORIDE 0.9 % IV SOLN
375.0000 mg/m2 | Freq: Once | INTRAVENOUS | Status: AC
  Administered 2020-11-26: 700 mg via INTRAVENOUS
  Filled 2020-11-26: qty 20

## 2020-11-26 NOTE — Telephone Encounter (Signed)
Critical Value: Plts <5 Dr Burr Medico notified Pt receiving plt transfusion today

## 2020-11-26 NOTE — Patient Instructions (Signed)
[facility]  Discharge Instructions: Thank you for choosing [facility] to provide your oncology and hematology care.   If you have a lab appointment with the Cancer Center, please go directly to the Cancer Center and check in at the registration area.   Wear comfortable clothing and clothing appropriate for easy access to any Portacath or PICC line.   We strive to give you quality time with your provider. You may need to reschedule your appointment if you arrive late (15 or more minutes).  Arriving late affects you and other patients whose appointments are after yours.  Also, if you miss three or more appointments without notifying the office, you may be dismissed from the clinic at the provider's discretion.      For prescription refill requests, have your pharmacy contact our office and allow 72 hours for refills to be completed.    Today you received the following chemotherapy and/or immunotherapy agents rituxan      To help prevent nausea and vomiting after your treatment, we encourage you to take your nausea medication as directed.  BELOW ARE SYMPTOMS THAT SHOULD BE REPORTED IMMEDIATELY: *FEVER GREATER THAN 100.4 F (38 C) OR HIGHER *CHILLS OR SWEATING *NAUSEA AND VOMITING THAT IS NOT CONTROLLED WITH YOUR NAUSEA MEDICATION *UNUSUAL SHORTNESS OF BREATH *UNUSUAL BRUISING OR BLEEDING *URINARY PROBLEMS (pain or burning when urinating, or frequent urination) *BOWEL PROBLEMS (unusual diarrhea, constipation, pain near the anus) TENDERNESS IN MOUTH AND THROAT WITH OR WITHOUT PRESENCE OF ULCERS (sore throat, sores in mouth, or a toothache) UNUSUAL RASH, SWELLING OR PAIN  UNUSUAL VAGINAL DISCHARGE OR ITCHING   Items with * indicate a potential emergency and should be followed up as soon as possible or go to the Emergency Department if any problems should occur.  Please show the CHEMOTHERAPY ALERT CARD or IMMUNOTHERAPY ALERT CARD at check-in to the  Emergency Department and triage nurse.  Should you have questions after your visit or need to cancel or reschedule your appointment, please contact [facility]  Dept: [phone]  and follow the prompts.  Office hours are 8:00 a.m. to 4:30 p.m. Monday - Friday. Please note that voicemails left after 4:00 p.m. may not be returned until the following business day.  We are closed weekends and major holidays. You have access to a nurse at all times for urgent questions. Please call the main number to the clinic Dept: [phone] and follow the prompts.   For any non-urgent questions, you may also contact your provider using MyChart. We now offer e-Visits for anyone 18 and older to request care online for non-urgent symptoms. For details visit [email or web address].   Also download the MyChart app! Go to the app store, search "MyChart", open the app, select [facility], and log in with your MyChart username and password.  Due to Covid, a mask is required upon entering the hospital/clinic. If you do not have a mask, one will be given to you upon arrival. For doctor visits, patients may have 1 support person aged 18 or older with them. For treatment visits, patients cannot have anyone with them due to current Covid guidelines and our immunocompromised population.   

## 2020-11-27 ENCOUNTER — Encounter (HOSPITAL_COMMUNITY): Payer: Self-pay

## 2020-11-27 ENCOUNTER — Encounter: Payer: Self-pay | Admitting: Family Medicine

## 2020-11-27 ENCOUNTER — Inpatient Hospital Stay: Payer: Medicare HMO | Admitting: Family Medicine

## 2020-11-27 ENCOUNTER — Telehealth: Payer: Self-pay | Admitting: Hematology

## 2020-11-27 ENCOUNTER — Other Ambulatory Visit: Payer: Self-pay

## 2020-11-27 ENCOUNTER — Other Ambulatory Visit: Payer: Self-pay | Admitting: Hematology

## 2020-11-27 DIAGNOSIS — D693 Immune thrombocytopenic purpura: Secondary | ICD-10-CM

## 2020-11-27 DIAGNOSIS — Z79899 Other long term (current) drug therapy: Secondary | ICD-10-CM | POA: Diagnosis not present

## 2020-11-27 DIAGNOSIS — I10 Essential (primary) hypertension: Secondary | ICD-10-CM | POA: Diagnosis not present

## 2020-11-27 DIAGNOSIS — D696 Thrombocytopenia, unspecified: Secondary | ICD-10-CM

## 2020-11-27 DIAGNOSIS — Z5112 Encounter for antineoplastic immunotherapy: Secondary | ICD-10-CM | POA: Diagnosis not present

## 2020-11-27 DIAGNOSIS — E119 Type 2 diabetes mellitus without complications: Secondary | ICD-10-CM | POA: Diagnosis not present

## 2020-11-27 DIAGNOSIS — D510 Vitamin B12 deficiency anemia due to intrinsic factor deficiency: Secondary | ICD-10-CM | POA: Diagnosis not present

## 2020-11-27 DIAGNOSIS — D649 Anemia, unspecified: Secondary | ICD-10-CM | POA: Diagnosis not present

## 2020-11-27 DIAGNOSIS — R1012 Left upper quadrant pain: Secondary | ICD-10-CM | POA: Diagnosis not present

## 2020-11-27 LAB — BPAM PLATELET PHERESIS
Blood Product Expiration Date: 202206182359
ISSUE DATE / TIME: 202206161200
Unit Type and Rh: 5100

## 2020-11-27 LAB — PREPARE PLATELET PHERESIS: Unit division: 0

## 2020-11-27 NOTE — Progress Notes (Addendum)
[place]   [phone] [phone]   Clinic Follow up Note   Patient Care Team: [name], DO as PCP - General (Family Medicine) Loletha Carrow, Kirke Corin, MD as Consulting Physician (Gastroenterology) Truitt Merle, MD as Consulting Physician (Hematology) Alda Berthold, DO as Consulting Physician (Neurology)  Date of Service:  12/02/2020  CHIEF COMPLAINT: F/u of Severe thrombocytopenia, ITP vs MDS/MPN ([name])   CURRENT THERAPY:  Weekly Rituxan X4 starting 11/19/20 Weekly Nplate injection starting 10/30/20 IVIG starting 10/29/20-11/16/20 Platelet transfusions as needed when plt<6 Blood transfusion for Hg <7.5 Prednisone $RemoveBeforeD'60mg'[id number]$  starting 10/30/20. Tapering off in 3 weeks   INTERVAL HISTORY:  Kathleen Gibson is here for a follow up. She was last seen by me 11/25/20. She presents to the clinic alone. She has noticed mild pain at left waist line, persistent, no injury. It's overall tolerable. No new bleeding, no fever or chills.  Moderate fatigue is overall stable.  All other systems were reviewed with the patient and are negative.  MEDICAL HISTORY:  Past Medical History:  Diagnosis Date   Diabetes ([place])    HLD (hyperlipidemia)    Hypertension    Pernicious anemia    Pneumonia 09/05/2019   Renal insufficiency 09/06/2019   Vitamin D deficiency     SURGICAL HISTORY: Past Surgical History:  Procedure Laterality Date   ABDOMINAL HERNIA REPAIR  2009   ABDOMINOPLASTY     AXILLARY LYMPH NODE BIOPSY Left 11/11/2020   Procedure: LEFT AXILLARY EXCISIONAL LYMPH NODE BIOPSY;  Surgeon: [name], MD;  Location: [facility];  Service: General;  Laterality: Left;   [place]   hemorroid surgery  2007   [place]    I have reviewed the social history and family history with the patient and they are unchanged from previous note.  ALLERGIES:  is allergic to asa [aspirin] and nsaids.  MEDICATIONS:   Current Outpatient Medications  Medication Sig Dispense Refill   Cyanocobalamin (B-12) 1000 MCG SUBL Place 1 tablet under the tongue daily. 90 tablet 3   ferrous sulfate 325 (65 FE) MG tablet Take 1 tablet (325 mg total) by mouth daily with breakfast. (Patient taking differently: Take 650 mg by mouth daily with breakfast.) 90 tablet 3   gabapentin (NEURONTIN) 300 MG capsule 900 my QHS and 300 mg every 8 hrs in day (Patient taking differently: Take 600 mg by mouth 2 (two) times daily.) 450 capsule 1   glucose monitoring kit (FREESTYLE) monitoring kit 1 each by Does not apply route as needed for other. Use as directed 1 each 0   metoprolol succinate (TOPROL-XL) 50 MG 24 hr tablet Take 1 tablet (50 mg total) by mouth daily. 90 tablet 1   mirtazapine (REMERON SOL-TAB) 15 MG disintegrating tablet Take 1 tablet (15 mg total) by mouth at bedtime. 90 tablet 1   sitaGLIPtin-metformin (JANUMET) 50-1000 MG tablet Take 1 tablet by mouth at bedtime. 90 tablet 1   Vitamin D, Cholecalciferol, 25 MCG (1000 UT) CAPS Take 1,000 Units by mouth daily.      zolpidem (AMBIEN) 5 MG tablet Take 1 tablet (5 mg total) by mouth at bedtime. 90 tablet 0   No current facility-administered medications for this visit.    PHYSICAL EXAMINATION: ECOG PERFORMANCE STATUS: 2 - Symptomatic, <50% confined to bed  Vitals:   12/02/20 0850  BP: 121/73  Pulse: (!) 111  Resp: 19  Temp: 98.7 F (37.1  C)  SpO2: 99%   There were no vitals filed for this visit.  GENERAL:alert, no distress and comfortable SKIN: skin color, texture, turgor are normal, no rashes or significant lesions, except mild ecchymosis on upper arms, and a large area of ecchymosis in right side abdomen secondary to injection EYES: normal, Conjunctiva are pink and non-injected, sclera clear NECK: supple, thyroid normal size, non-tender, without nodularity LYMPH:  (+) Stable small palpable left supraclavicular and bilateral axillary adenopathy LUNGS: clear to  auscultation and percussion with normal breathing effort HEART: regular rate & rhythm and no murmurs and no lower extremity edema ABDOMEN:abdomen soft, non-tender and normal bowel sounds Musculoskeletal:no cyanosis of digits and no clubbing  NEURO: alert & oriented x 3 with fluent speech, no focal motor/sensory deficits  LABORATORY DATA:  I have reviewed the data as listed CBC Latest Ref Rng & Units 12/02/2020 11/30/2020 11/26/2020  WBC 4.0 - 10.5 K/uL 9.6 11.0(H) 8.6  Hemoglobin 12.0 - 15.0 g/dL 9.2(L) 8.9(L) 9.1(L)  Hematocrit 36.0 - 46.0 % 29.5(L) 28.8(L) 29.7(L)  Platelets 150 - 400 K/uL 11(L) 7(LL) <5(LL)     CMP Latest Ref Rng & Units 11/30/2020 11/26/2020 11/25/2020  Glucose 70 - 99 mg/dL 153(H) 191(H) 145(H)  BUN 8 - 23 mg/dL $Remove'14 14 14  'CBXxViB$ Creatinine 0.44 - 1.00 mg/dL 0.83 0.77 0.81  Sodium 135 - 145 mmol/L 134(L) 137 139  Potassium 3.5 - 5.1 mmol/L 4.2 4.5 4.2  Chloride 98 - 111 mmol/L 103 104 107  CO2 22 - 32 mmol/L $RemoveB'25 28 26  'GwjeRCgP$ Calcium 8.9 - 10.3 mg/dL 8.0(L) 8.6(L) 8.4(L)  Total Protein 6.5 - 8.1 g/dL 8.5(H) 9.2(H) 9.1(H)  Total Bilirubin 0.3 - 1.2 mg/dL 0.8 0.5 0.7  Alkaline Phos 38 - 126 U/L 65 57 66  AST 15 - 41 U/L $Remo'16 17 15  'eQmBj$ ALT 0 - 44 U/L $Remo'11 13 9      'UoGhM$ RADIOGRAPHIC STUDIES: I have personally reviewed the radiological images as listed and agreed with the findings in the report. No results found.   ASSESSMENT & PLAN:  Kathleen Gibson is a 70 y.o. female with   1. Severe thrombocytopenia, ITP vs MDS/MPN vs [name], Splenomegaly -She was initially diagnosed with ITP in 2014. She lost follow up after 11/2017 and did not proceed with then recommended bone marrow biopsy. -With recent labs of 4K on ED visit. During hospitalization she was treated with platelet transfusions, IVIG, oral prednisone $RemoveBeforeD'60mg'qqXMzgRzcFBGjF$  and Nplate injections. She is now tapering off prednisone given little response. -I discussed her splenomegaly has played a role in her poor response to platelet transfusions as well.  Given recurrent transfusions, platelet transfusion will be limited to 1 unit/day if plt<=5K, if no significant bleeding. -I started her on chemo treatment with weekly Rituxan X4 on 11/19/20 to treat her severe refractory thrombocytopenia. -Based on her 10/30/20 bone marrow biopsy, Dr Irene Limbo and I feel this is likely MDS/MPN, particularly [name].  However her diagnosis has been difficult, especially the exact etiology of severe thrombocytopenia and lymphadenopathy are not very clear. I again encouraged her today to seek second opinion at [facility], she is agreeable if they can offer virtual visit. -We will continue Nplate weekly, next dose due tomorrow, will increase dose to 36mcg/kg tomorrow -We will proceed third dose Rituxan tomorrow in office -We will continue platelet transfusion if plt<6K, no need today, blood bank is aware of her need for HLA matched platelet transfusion -f/u in 2 weeks      2. Normocytic anemia, moderate  -  she has a history of pernicious anemia/vitamin B12 deficiency -with recent severe thrombocytopenia, she has had loq ferritin and iron study with moderate anemia. She required blood transfusion in hospital. Will give blood transfusion for Hg <=7.5.  -Hemoglobin 9.2 today, overall stable/slightly improved    3. Lymphadenopathy, reactive?  -Her 09/05/19 CT Scan showed findings are concerning for neoplastic process given interval worsening adenopathy over the upper abdomen, mesenteric, periaortic region, and iliac chains compared to 2017 scan. -Her 10/30/20 LN biopsy was negative for malignant cells, but sample was limited in the flow cytometry was not able to obtain. Her surgical lymph node biopsy on 11/11/20  was negative for lymphoma and showed follicular hyperplasia, this is likely a reactive process.     4. Type 2 diabetes mellitus with polyneuropathy, Hypertension -Continue medication, she will follow-up with her family doctor this Friday   5. Left UQ abdominal pain -Likely secondary  to splenomegaly -Is tolerable, continue monitoring.   PLAN:  -lab reviewed, no need plt transfusion today  -We will proceed third dose of Rituxan infusion tomorrow, and Nplate, increased dose to 73mcg/kg  -lab and plt transfusion this Friday, and MWF next week -f/u in 2 weeks  -referral to [facility] hematology    No problem-specific Assessment & Plan notes found for this encounter.   No orders of the defined types were placed in this encounter.  All questions were answered. The patient knows to call the clinic with any problems, questions or concerns. No barriers to learning was detected.      Truitt Merle, MD 12/02/2020   I, Joslyn Devon, am acting as scribe for Truitt Merle, MD.   I have reviewed the above documentation for accuracy and completeness, and I agree with the above.

## 2020-11-27 NOTE — Telephone Encounter (Signed)
Attempted to leave voicemail with follow-up appointments per 6/15 los. Voicemail full.

## 2020-11-28 ENCOUNTER — Other Ambulatory Visit: Payer: Self-pay

## 2020-11-28 ENCOUNTER — Inpatient Hospital Stay: Payer: Medicare HMO

## 2020-11-28 DIAGNOSIS — Z5112 Encounter for antineoplastic immunotherapy: Secondary | ICD-10-CM | POA: Diagnosis not present

## 2020-11-28 DIAGNOSIS — D693 Immune thrombocytopenic purpura: Secondary | ICD-10-CM

## 2020-11-28 DIAGNOSIS — D510 Vitamin B12 deficiency anemia due to intrinsic factor deficiency: Secondary | ICD-10-CM | POA: Diagnosis not present

## 2020-11-28 DIAGNOSIS — Z79899 Other long term (current) drug therapy: Secondary | ICD-10-CM | POA: Diagnosis not present

## 2020-11-28 DIAGNOSIS — E119 Type 2 diabetes mellitus without complications: Secondary | ICD-10-CM | POA: Diagnosis not present

## 2020-11-28 DIAGNOSIS — D649 Anemia, unspecified: Secondary | ICD-10-CM | POA: Diagnosis not present

## 2020-11-28 DIAGNOSIS — I10 Essential (primary) hypertension: Secondary | ICD-10-CM | POA: Diagnosis not present

## 2020-11-28 DIAGNOSIS — D696 Thrombocytopenia, unspecified: Secondary | ICD-10-CM

## 2020-11-28 DIAGNOSIS — R1012 Left upper quadrant pain: Secondary | ICD-10-CM | POA: Diagnosis not present

## 2020-11-28 MED ORDER — SODIUM CHLORIDE 0.9% IV SOLUTION
250.0000 mL | Freq: Once | INTRAVENOUS | Status: AC
  Administered 2020-11-28: 250 mL via INTRAVENOUS
  Filled 2020-11-28: qty 250

## 2020-11-28 NOTE — Patient Instructions (Signed)
Platelet Transfusion A platelet transfusion is a procedure in which you receive donated platelets through an IV. Platelets are tiny pieces of blood cells. When you get an injury, platelets clump together in the area to form a blood clot. This helps stop bleeding and is the beginning of the healing process. If you have too few platelets, your blood may have trouble clotting. This may cause you to bleedand bruise very easily. You may need a platelet transfusion if you have a condition that causes a low number of platelets (thrombocytopenia). A platelet transfusion may be used to stop or prevent excessive bleeding. Tell a health care provider about: Any reactions you have had during previous transfusions. Any allergies you have. All medicines you are taking, including vitamins, herbs, eye drops, creams, and over-the-counter medicines. Any blood disorders you have. Any surgeries you have had. Any medical conditions you have. Whether you are pregnant or may be pregnant. What are the risks? Generally, this is a safe procedure. However, problems may occur, including: Fever. Infection. Allergic reaction to the donor platelets. Your body's disease-fighting system (immune system) attacking the donor platelets (hemolytic reaction). This is rare. A rare reaction that causes lung damage (transfusion-related acute lung injury). What happens before the procedure? Medicines Ask your health care provider about: Changing or stopping your regular medicines. This is especially important if you are taking diabetes medicines or blood thinners. Taking medicines such as aspirin and ibuprofen. These medicines can thin your blood. Do not take these medicines unless your health care provider tells you to take them. Taking over-the-counter medicines, vitamins, herbs, and supplements. General instructions You will have a blood test to determine your blood type. Your blood type determines what kind of platelets you will  be given. Follow instructions from your health care provider about eating or drinking restrictions. If you have had an allergic reaction to a transfusion in the past, you may be given medicine to help prevent a reaction. Your temperature, blood pressure, pulse, and breathing will be monitored. What happens during the procedure?  An IV will be inserted into one of your veins. For your safety, two health care providers will verify your identity along with the donor platelets about to be infused. A bag of donor platelets will be connected to your IV. The platelets will flow into your bloodstream. This usually takes 30-60 minutes. Your temperature, blood pressure, pulse, and breathing will be monitored during the transfusion. This helps detect early signs of any reaction. You will also be monitored for other symptoms that may indicate a reaction, including chills, hives, or itching. If you have signs of a reaction at any time, your transfusion will be stopped, and you may be given medicine to help manage the reaction. When your transfusion is complete, your IV will be removed. Pressure may be applied to the IV site for a few minutes to stop any bleeding. The IV site will be covered with a bandage (dressing). The procedure may vary among health care providers and hospitals. What happens after the procedure? Your blood pressure, temperature, pulse, and breathing will be monitored until you leave the hospital or clinic. You may have some bruising and soreness at your IV site. Follow these instructions at home: Medicines Take over-the-counter and prescription medicines only as told by your health care provider. Talk with your health care provider before you take any medicines that contain aspirin or NSAIDs. These medicines increase your risk for dangerous bleeding. General instructions Change or remove your dressing as told  by your health care provider. Return to your normal activities as told by  your health care provider. Ask your health care provider what activities are safe for you. Do not take baths, swim, or use a hot tub until your health care provider approves. Ask your health care provider if you may take showers. Check your IV site every day for signs of infection. Check for: Redness, swelling, or pain. Fluid or blood. If fluid or blood drains from your IV site, use your hands to press down firmly on a bandage covering the area for a minute or two. Doing this should stop the bleeding. Warmth. Pus or a bad smell. Keep all follow-up visits as told by your health care provider. This is important. Contact a health care provider if you have: A headache that does not go away with medicine. Hives, rash, or itchy skin. Nausea or vomiting. Unusual tiredness or weakness. Signs of infection at your IV site. Get help right away if: You have a fever or chills. You urinate less often than usual. Your urine is darker colored than normal. You have any of the following: Trouble breathing. Pain in your back, abdomen, or chest. Cool, clammy skin. A fast heartbeat. Summary Platelets are tiny pieces of blood cells that clump together to form a blood clot when you have an injury. If you have too few platelets, your blood may have trouble clotting. A platelet transfusion is a procedure in which you receive donated platelets through an IV. A platelet transfusion may be used to stop or prevent excessive bleeding. After the procedure, check your IV site every day for signs of infection, including redness, swelling, pain, or warmth. This information is not intended to replace advice given to you by your health care provider. Make sure you discuss any questions you have with your healthcare provider. Document Revised: 07/05/2017 Document Reviewed: 07/05/2017 Elsevier Patient Education  2022 [employer].

## 2020-11-29 LAB — BPAM PLATELET PHERESIS
Blood Product Expiration Date: 202206212359
ISSUE DATE / TIME: 202206180941
Unit Type and Rh: 5100

## 2020-11-29 LAB — PREPARE PLATELET PHERESIS: Unit division: 0

## 2020-11-30 ENCOUNTER — Other Ambulatory Visit: Payer: Self-pay

## 2020-11-30 ENCOUNTER — Telehealth: Payer: Self-pay

## 2020-11-30 ENCOUNTER — Other Ambulatory Visit: Payer: Medicare HMO

## 2020-11-30 ENCOUNTER — Inpatient Hospital Stay: Payer: Medicare HMO

## 2020-11-30 ENCOUNTER — Encounter (HOSPITAL_COMMUNITY): Payer: Self-pay

## 2020-11-30 DIAGNOSIS — I10 Essential (primary) hypertension: Secondary | ICD-10-CM | POA: Diagnosis not present

## 2020-11-30 DIAGNOSIS — D693 Immune thrombocytopenic purpura: Secondary | ICD-10-CM | POA: Diagnosis not present

## 2020-11-30 DIAGNOSIS — Z79899 Other long term (current) drug therapy: Secondary | ICD-10-CM | POA: Diagnosis not present

## 2020-11-30 DIAGNOSIS — D649 Anemia, unspecified: Secondary | ICD-10-CM | POA: Diagnosis not present

## 2020-11-30 DIAGNOSIS — D696 Thrombocytopenia, unspecified: Secondary | ICD-10-CM

## 2020-11-30 DIAGNOSIS — Z5112 Encounter for antineoplastic immunotherapy: Secondary | ICD-10-CM | POA: Diagnosis not present

## 2020-11-30 DIAGNOSIS — E119 Type 2 diabetes mellitus without complications: Secondary | ICD-10-CM | POA: Diagnosis not present

## 2020-11-30 DIAGNOSIS — D510 Vitamin B12 deficiency anemia due to intrinsic factor deficiency: Secondary | ICD-10-CM | POA: Diagnosis not present

## 2020-11-30 DIAGNOSIS — R1012 Left upper quadrant pain: Secondary | ICD-10-CM | POA: Diagnosis not present

## 2020-11-30 LAB — CMP (CANCER CENTER ONLY)
ALT: 11 U/L (ref 0–44)
AST: 16 U/L (ref 15–41)
Albumin: 3 g/dL — ABNORMAL LOW (ref 3.5–5.0)
Alkaline Phosphatase: 65 U/L (ref 38–126)
Anion gap: 6 (ref 5–15)
BUN: 14 mg/dL (ref 8–23)
CO2: 25 mmol/L (ref 22–32)
Calcium: 8 mg/dL — ABNORMAL LOW (ref 8.9–10.3)
Chloride: 103 mmol/L (ref 98–111)
Creatinine: 0.83 mg/dL (ref 0.44–1.00)
GFR, Estimated: >60 mL/min (ref >60)
Glucose, Bld: 153 mg/dL — ABNORMAL HIGH (ref 70–99)
Potassium: 4.2 mmol/L (ref 3.5–5.1)
Sodium: 134 mmol/L — ABNORMAL LOW (ref 135–145)
Total Bilirubin: 0.8 mg/dL (ref 0.3–1.2)
Total Protein: 8.5 g/dL — ABNORMAL HIGH (ref 6.5–8.1)

## 2020-11-30 LAB — CBC WITH DIFFERENTIAL (CANCER CENTER ONLY)
Abs Immature Granulocytes: 1 10*3/uL — ABNORMAL HIGH (ref 0.00–0.07)
Band Neutrophils: 7 %
Basophils Absolute: 0 10*3/uL (ref 0.0–0.1)
Basophils Relative: 0 %
Blasts: 1 %
Eosinophils Absolute: 0 10*3/uL (ref 0.0–0.5)
Eosinophils Relative: 0 %
HCT: 28.8 % — ABNORMAL LOW (ref 36.0–46.0)
Hemoglobin: 8.9 g/dL — ABNORMAL LOW (ref 12.0–15.0)
Lymphocytes Relative: 7 %
Lymphs Abs: 0.8 10*3/uL (ref 0.7–4.0)
MCH: 27.8 pg (ref 26.0–34.0)
MCHC: 30.9 g/dL (ref 30.0–36.0)
MCV: 90 fL (ref 80.0–100.0)
Metamyelocytes Relative: 6 %
Monocytes Absolute: 2.9 10*3/uL — ABNORMAL HIGH (ref 0.1–1.0)
Monocytes Relative: 26 %
Myelocytes: 3 %
Neutro Abs: 6.3 10*3/uL (ref 1.7–7.7)
Neutrophils Relative %: 50 %
Platelet Count: 7 10*3/uL — CL (ref 150–400)
RBC: 3.2 MIL/uL — ABNORMAL LOW (ref 3.87–5.11)
RDW: 18.2 % — ABNORMAL HIGH (ref 11.5–15.5)
WBC Count: 11 10*3/uL — ABNORMAL HIGH (ref 4.0–10.5)
nRBC: 0.2 % (ref 0.0–0.2)

## 2020-11-30 NOTE — Progress Notes (Signed)
Per Dr. Burr Medico, patient does not meet transfusion criteria today. Will discharge home without further intervention. Patient verbalized feeling fatigued and also c/o LUQ abdominal pain per norm. No recent worsening. Informed patient to call clinic if symptoms worsen. Patient verbalized understanding.

## 2020-11-30 NOTE — Telephone Encounter (Signed)
Critical Value: Plt 7,000 Dr Burr Medico notified Pt scheduled for platelet transfusion this am

## 2020-12-02 ENCOUNTER — Inpatient Hospital Stay: Payer: Medicare HMO

## 2020-12-02 ENCOUNTER — Other Ambulatory Visit: Payer: Medicare HMO

## 2020-12-02 ENCOUNTER — Inpatient Hospital Stay (HOSPITAL_BASED_OUTPATIENT_CLINIC_OR_DEPARTMENT_OTHER): Payer: Medicare HMO | Admitting: Hematology

## 2020-12-02 ENCOUNTER — Encounter: Payer: Self-pay | Admitting: Hematology

## 2020-12-02 ENCOUNTER — Other Ambulatory Visit: Payer: Self-pay

## 2020-12-02 VITALS — BP 121/73 | HR 111 | Temp 98.7°F | Resp 19 | Ht 63.0 in

## 2020-12-02 DIAGNOSIS — D693 Immune thrombocytopenic purpura: Secondary | ICD-10-CM

## 2020-12-02 DIAGNOSIS — I10 Essential (primary) hypertension: Secondary | ICD-10-CM | POA: Diagnosis not present

## 2020-12-02 DIAGNOSIS — E119 Type 2 diabetes mellitus without complications: Secondary | ICD-10-CM | POA: Diagnosis not present

## 2020-12-02 DIAGNOSIS — Z79899 Other long term (current) drug therapy: Secondary | ICD-10-CM | POA: Diagnosis not present

## 2020-12-02 DIAGNOSIS — D649 Anemia, unspecified: Secondary | ICD-10-CM | POA: Diagnosis not present

## 2020-12-02 DIAGNOSIS — Z5112 Encounter for antineoplastic immunotherapy: Secondary | ICD-10-CM | POA: Diagnosis not present

## 2020-12-02 DIAGNOSIS — D696 Thrombocytopenia, unspecified: Secondary | ICD-10-CM

## 2020-12-02 DIAGNOSIS — R1012 Left upper quadrant pain: Secondary | ICD-10-CM | POA: Diagnosis not present

## 2020-12-02 DIAGNOSIS — D510 Vitamin B12 deficiency anemia due to intrinsic factor deficiency: Secondary | ICD-10-CM | POA: Diagnosis not present

## 2020-12-02 LAB — CBC WITH DIFFERENTIAL (CANCER CENTER ONLY)
Abs Immature Granulocytes: 1.17 10*3/uL — ABNORMAL HIGH (ref 0.00–0.07)
Basophils Absolute: 0.1 10*3/uL (ref 0.0–0.1)
Basophils Relative: 1 %
Eosinophils Absolute: 0 10*3/uL (ref 0.0–0.5)
Eosinophils Relative: 0 %
HCT: 29.5 % — ABNORMAL LOW (ref 36.0–46.0)
Hemoglobin: 9.2 g/dL — ABNORMAL LOW (ref 12.0–15.0)
Immature Granulocytes: 12 %
Lymphocytes Relative: 6 %
Lymphs Abs: 0.6 10*3/uL — ABNORMAL LOW (ref 0.7–4.0)
MCH: 28.1 pg (ref 26.0–34.0)
MCHC: 31.2 g/dL (ref 30.0–36.0)
MCV: 90.2 fL (ref 80.0–100.0)
Monocytes Absolute: 2.7 10*3/uL — ABNORMAL HIGH (ref 0.1–1.0)
Monocytes Relative: 29 %
Neutro Abs: 5 10*3/uL (ref 1.7–7.7)
Neutrophils Relative %: 52 %
Platelet Count: 11 10*3/uL — ABNORMAL LOW (ref 150–400)
RBC: 3.27 MIL/uL — ABNORMAL LOW (ref 3.87–5.11)
RDW: 18.1 % — ABNORMAL HIGH (ref 11.5–15.5)
WBC Count: 9.6 10*3/uL (ref 4.0–10.5)
nRBC: 0.2 % (ref 0.0–0.2)

## 2020-12-02 NOTE — Progress Notes (Signed)
Per Dr Burr Medico, no plt transfusion needed today.  Pt to Dr Ernestina Penna office for visit.  No additional orders.

## 2020-12-03 ENCOUNTER — Encounter: Payer: Self-pay | Admitting: Hematology

## 2020-12-03 ENCOUNTER — Other Ambulatory Visit: Payer: Self-pay

## 2020-12-03 ENCOUNTER — Other Ambulatory Visit: Payer: Medicare HMO

## 2020-12-03 ENCOUNTER — Inpatient Hospital Stay: Payer: Medicare HMO

## 2020-12-03 ENCOUNTER — Telehealth: Payer: Self-pay | Admitting: Hematology

## 2020-12-03 VITALS — BP 102/57 | HR 87 | Temp 98.4°F | Resp 17

## 2020-12-03 DIAGNOSIS — D693 Immune thrombocytopenic purpura: Secondary | ICD-10-CM | POA: Diagnosis not present

## 2020-12-03 DIAGNOSIS — Z79899 Other long term (current) drug therapy: Secondary | ICD-10-CM | POA: Diagnosis not present

## 2020-12-03 DIAGNOSIS — R1012 Left upper quadrant pain: Secondary | ICD-10-CM | POA: Diagnosis not present

## 2020-12-03 DIAGNOSIS — D510 Vitamin B12 deficiency anemia due to intrinsic factor deficiency: Secondary | ICD-10-CM | POA: Diagnosis not present

## 2020-12-03 DIAGNOSIS — I10 Essential (primary) hypertension: Secondary | ICD-10-CM | POA: Diagnosis not present

## 2020-12-03 DIAGNOSIS — Z5112 Encounter for antineoplastic immunotherapy: Secondary | ICD-10-CM | POA: Diagnosis not present

## 2020-12-03 DIAGNOSIS — E119 Type 2 diabetes mellitus without complications: Secondary | ICD-10-CM | POA: Diagnosis not present

## 2020-12-03 DIAGNOSIS — D696 Thrombocytopenia, unspecified: Secondary | ICD-10-CM

## 2020-12-03 DIAGNOSIS — D649 Anemia, unspecified: Secondary | ICD-10-CM | POA: Diagnosis not present

## 2020-12-03 MED ORDER — DIPHENHYDRAMINE HCL 25 MG PO CAPS
50.0000 mg | ORAL_CAPSULE | Freq: Once | ORAL | Status: AC
  Administered 2020-12-03: 50 mg via ORAL

## 2020-12-03 MED ORDER — DIPHENHYDRAMINE HCL 25 MG PO CAPS
ORAL_CAPSULE | ORAL | Status: AC
  Filled 2020-12-03: qty 2

## 2020-12-03 MED ORDER — ACETAMINOPHEN 325 MG PO TABS
ORAL_TABLET | ORAL | Status: AC
  Filled 2020-12-03: qty 2

## 2020-12-03 MED ORDER — ACETAMINOPHEN 325 MG PO TABS
650.0000 mg | ORAL_TABLET | Freq: Once | ORAL | Status: AC
  Administered 2020-12-03: 650 mg via ORAL

## 2020-12-03 MED ORDER — SODIUM CHLORIDE 0.9 % IV SOLN
Freq: Once | INTRAVENOUS | Status: AC
  Filled 2020-12-03: qty 250

## 2020-12-03 MED ORDER — SODIUM CHLORIDE 0.9 % IV SOLN
375.0000 mg/m2 | Freq: Once | INTRAVENOUS | Status: AC
  Administered 2020-12-03: 700 mg via INTRAVENOUS
  Filled 2020-12-03: qty 50

## 2020-12-03 MED ORDER — ROMIPLOSTIM INJECTION 500 MCG
7.0000 ug/kg | Freq: Once | SUBCUTANEOUS | Status: AC
  Administered 2020-12-03: 520 ug via SUBCUTANEOUS
  Filled 2020-12-03: qty 0.25

## 2020-12-03 NOTE — Patient Instructions (Signed)
[facility]  Discharge Instructions: Thank you for choosing [facility] to provide your oncology and hematology care.   If you have a lab appointment with the [facility], please go directly to the [facility] and check in at the registration area.   Wear comfortable clothing and clothing appropriate for easy access to any Portacath or PICC line.   We strive to give you quality time with your provider. You may need to reschedule your appointment if you arrive late (15 or more minutes).  Arriving late affects you and other patients whose appointments are after yours.  Also, if you miss three or more appointments without notifying the office, you may be dismissed from the clinic at the provider's discretion.      For prescription refill requests, have your pharmacy contact our office and allow 72 hours for refills to be completed.    Today you received the following chemotherapy and/or immunotherapy agents Rituxan & Nplate      To help prevent nausea and vomiting after your treatment, we encourage you to take your nausea medication as directed.  BELOW ARE SYMPTOMS THAT SHOULD BE REPORTED IMMEDIATELY: *FEVER GREATER THAN 100.4 F (38 C) OR HIGHER *CHILLS OR SWEATING *NAUSEA AND VOMITING THAT IS NOT CONTROLLED WITH YOUR NAUSEA MEDICATION *UNUSUAL SHORTNESS OF BREATH *UNUSUAL BRUISING OR BLEEDING *URINARY PROBLEMS (pain or burning when urinating, or frequent urination) *BOWEL PROBLEMS (unusual diarrhea, constipation, pain near the anus) TENDERNESS IN MOUTH AND THROAT WITH OR WITHOUT PRESENCE OF ULCERS (sore throat, sores in mouth, or a toothache) UNUSUAL RASH, SWELLING OR PAIN  UNUSUAL VAGINAL DISCHARGE OR ITCHING   Items with * indicate a potential emergency and should be followed up as soon as possible or go to the Emergency Department if any problems should occur.  Please show the CHEMOTHERAPY ALERT CARD or IMMUNOTHERAPY ALERT CARD at  check-in to the Emergency Department and triage nurse.  Should you have questions after your visit or need to cancel or reschedule your appointment, please contact [facility]  Dept: [phone]  and follow the prompts.  Office hours are 8:00 a.m. to 4:30 p.m. Monday - Friday. Please note that voicemails left after 4:00 p.m. may not be returned until the following business day.  We are closed weekends and major holidays. You have access to a nurse at all times for urgent questions. Please call the main number to the clinic Dept: [phone] and follow the prompts.   For any non-urgent questions, you may also contact your provider using MyChart. We now offer e-Visits for anyone 69 and older to request care online for non-urgent symptoms. For details visit [email or web address].   Also download the MyChart app! Go to the app store, search "MyChart", open the app, select [facility], and log in with your MyChart username and password.  Due to Covid, a mask is required upon entering the hospital/clinic. If you do not have a mask, one will be given to you upon arrival. For doctor visits, patients may have 1 support person aged 69 or older with them. For treatment visits, patients cannot have anyone with them due to current Covid guidelines and our immunocompromised population.   Rituximab Injection What is this medication? RITUXIMAB (ri TUX i mab) is a monoclonal antibody. It is used to treat certain types of cancer like non-Hodgkin lymphoma and chronic lymphocytic leukemia. It is also used to treat rheumatoid arthritis, granulomatosis with polyangiitis,microscopic polyangiitis, and pemphigus vulgaris. This  medicine may be used for other purposes; ask your health care provider orpharmacist if you have questions. COMMON BRAND NAME(S): RIABNI, Rituxan, RUXIENCE What should I tell my care team before I take this medication? They need to know if you have any of these  conditions: chest pain heart disease infection especially a viral infection such as chickenpox, cold sores, hepatitis B, or herpes immune system problems irregular heartbeat or rhythm kidney disease low blood counts (white cells, platelets, or red cells) lung disease recent or upcoming vaccine an unusual or allergic reaction to rituximab, other medicines, foods, dyes, or preservatives pregnant or trying to get pregnant breast-feeding How should I use this medication? This medicine is injected into a vein. It is given by a health care provider ina hospital or clinic setting. A special MedGuide will be given to you before each treatment. Be sure to readthis information carefully each time. Talk to your health care provider about the use of this medicine in children. While this drug may be prescribed for children as young as 6 months forselected conditions, precautions do apply. Overdosage: If you think you have taken too much of this medicine contact apoison control center or emergency room at once. NOTE: This medicine is only for you. Do not share this medicine with others. What if I miss a dose? Keep appointments for follow-up doses. It is important not to miss your dose.Call your health care provider if you are unable to keep an appointment. What may interact with this medication? Do not take this medicine with any of the following medicines: live vaccines This medicine may also interact with the following medicines: cisplatin This list may not describe all possible interactions. Give your health care provider a list of all the medicines, herbs, non-prescription drugs, or dietary supplements you use. Also tell them if you smoke, drink alcohol, or use illegaldrugs. Some items may interact with your medicine. What should I watch for while using this medication? Your condition will be monitored carefully while you are receiving thismedicine. You may need blood work done while you are taking  this medicine. This medicine can cause serious infusion reactions. To reduce the risk your health care provider may give you other medicines to take before receiving thisone. Be sure to follow the directions from your health care provider. This medicine may increase your risk of getting an infection. Call your health care provider for advice if you get a fever, chills, sore throat, or other symptoms of a cold or flu. Do not treat yourself. Try to avoid being aroundpeople who are sick. Call your health care provider if you are around anyone with measles,chickenpox, or if you develop sores or blisters that do not heal properly. Avoid taking medicines that contain aspirin, acetaminophen, ibuprofen, naproxen, or ketoprofen unless instructed by your health care provider. Thesemedicines may hide a fever. This medicine may cause serious skin reactions. They can happen weeks to months after starting the medicine. Contact your health care provider right away if you notice fevers or flu-like symptoms with a rash. The rash may be red or purple and then turn into blisters or peeling of the skin. Or, you might notice a red rash with swelling of the face, lips or lymph nodes in your neck or underyour arms. In some patients, this medicine may cause a serious brain infection that may cause death. If you have any problems seeing, thinking, speaking, walking, or standing, tell your healthcare professional right away. If you cannot Soil scientist, urgently seek  other source of medical care. Do not become pregnant while taking this medicine or for at least 12 months after stopping it. Women should inform their health care provider if they wish to become pregnant or think they might be pregnant. There is potential for serious harm to an unborn child. Talk to your health care provider for more information. Women should use a reliable form of birth control while taking this medicine and for 12 months after  stopping it. Do not breast-feed whiletaking this medicine or for at least 6 months after stopping it. What side effects may I notice from receiving this medication? Side effects that you should report to your health care provider as soon aspossible: allergic reactions (skin rash, itching or hives; swelling of the face, lips, or tongue) diarrhea edema (sudden weight gain; swelling of the ankles, feet, hands or other unusual swelling; trouble breathing) fast, irregular heartbeat heart attack (trouble breathing; pain or tightness in the chest, neck, back or arms; unusually weak or tired) infection (fever, chills, cough, sore throat, pain or trouble passing urine) kidney injury (trouble passing urine or change in the amount of urine) liver injury (dark yellow or brown urine; general ill feeling or flu-like symptoms; loss of appetite, right upper belly pain; unusually weak or tired, yellowing of the eyes or skin) low blood pressure (dizziness; feeling faint or lightheaded, falls; unusually weak or tired) low red blood cell counts (trouble breathing; feeling faint; lightheaded, falls; unusually weak or tired) mouth sores redness, blistering, peeling, or loosening of the skin, including inside the mouth stomach pain unusual bruising or bleeding wheezing (trouble breathing with loud or whistling sounds) vomiting Side effects that usually do not require medical attention (report to yourhealth care provider if they continue or are bothersome): headache joint pain muscle cramps, pain nausea This list may not describe all possible side effects. Call your doctor for medical advice about side effects. You may report side effects to FDA at1-800-FDA-1088. Where should I keep my medication? This medicine is given in a hospital or clinic. It will not be stored at home. NOTE: This sheet is a summary. It may not cover all possible information. If you have questions about this medicine, talk to your doctor,  pharmacist, orhealth care provider.  2022 Elsevier/Gold Standard (2020-05-21 15:47:26)  Romiplostim injection What is this medication? ROMIPLOSTIM (roe mi PLOE stim) helps your body make more platelets. This medicine is used to treat low platelets caused by chronic idiopathic thrombocytopenic purpura (ITP) or a bone marrow syndrome caused by radiationsickness. This medicine may be used for other purposes; ask your health care provider orpharmacist if you have questions. COMMON BRAND NAME(S): Nplate What should I tell my care team before I take this medication? They need to know if you have any of these conditions: blood clots myelodysplastic syndrome an unusual or allergic reaction to romiplostim, mannitol, other medicines, foods, dyes, or preservatives pregnant or trying to get pregnant breast-feeding How should I use this medication? This medicine is injected under the skin. It is given by a health care providerin a hospital or clinic setting. A special MedGuide will be given to you before each treatment. Be sure to readthis information carefully each time. Talk to your health care provider about the use of this medicine in children. While it may be prescribed for children as young as newborns for selectedconditions, precautions do apply. Overdosage: If you think you have taken too much of this medicine contact apoison control center or emergency room at once. NOTE:  This medicine is only for you. Do not share this medicine with others. What if I miss a dose? Keep appointments for follow-up doses. It is important not to miss your dose.Call your health care provider if you are unable to keep an appointment. What may interact with this medication? Interactions are not expected. This list may not describe all possible interactions. Give your health care provider a list of all the medicines, herbs, non-prescription drugs, or dietary supplements you use. Also tell them if you smoke, drink  alcohol, or use illegaldrugs. Some items may interact with your medicine. What should I watch for while using this medication? Visit your health care provider for regular checks on your progress. You may need blood work done while you are taking this medicine. Your condition will be monitored carefully while you are receiving this medicine. It is important notto miss any appointments. What side effects may I notice from receiving this medication? Side effects that you should report to your doctor or health care professionalas soon as possible: allergic reactions (skin rash, itching or hives; swelling of the face, lips, or tongue) bleeding (bloody or black, tarry stools; red or dark brown urine; spitting up blood or brown material that looks like coffee grounds; red spots on the skin; unusual bruising or bleeding from the eyes, gums, or nose) blood clot (chest pain; shortness of breath; pain, swelling, or warmth in the leg) stroke (changes in vision; confusion; trouble speaking or understanding; severe headaches; sudden numbness or weakness of the face, arm or leg; trouble walking; dizziness; loss of balance or coordination) Side effects that usually do not require medical attention (report to yourdoctor or health care professional if they continue or are bothersome): diarrhea dizziness headache joint pain muscle pain stomach pain trouble sleeping This list may not describe all possible side effects. Call your doctor for medical advice about side effects. You may report side effects to FDA at1-800-FDA-1088. Where should I keep my medication? This medicine is given in a hospital or clinic. It will not be stored at home. NOTE: This sheet is a summary. It may not cover all possible information. If you have questions about this medicine, talk to your doctor, pharmacist, orhealth care provider.  2022 Elsevier/Gold Standard (2019-07-15 10:28:13)

## 2020-12-03 NOTE — Telephone Encounter (Signed)
Unable to leave voicemail with follow-up appointments per 6/22 los. Will give updated calendar on future visit.

## 2020-12-03 NOTE — Progress Notes (Signed)
Rapid Infusion Rituximab Pharmacist Evaluation  [facility] is a 70 y.o. female being treated with rituximab for ITP. This patient may be considered for RIR.   A pharmacist has verified the patient tolerated rituximab infusions per the [facility] standard infusion protocol without grade 3-4 infusion reactions. The treatment plan will be updated to reflect RIR if the patient qualifies per the checklist below:   Age > 49 years old Yes   Clinically significant cardiovascular disease No   Circulating lymphocyte count < 5000/uL prior to cycle two Yes  Lab Results  Component Value Date   LYMPHSABS 0.6 (L) 12/02/2020    Prior documented grade 3-4 infusion reaction to rituximab No   Prior documented grade 1-2 infusion reaction to rituximab (If YES, Pharmacist will confirm with Physician if patient is still a candidate for RIR) No   Previous rituximab infusion within the past 6 months Yes   Treatment Plan updated orders to reflect RIR Yes    [facility] does meet the criteria for Rapid Infusion Rituximab. This patient is going to be switched to rapid infusion rituximab.    Kennith Center, Pharm.D., CPP 12/03/2020@2 :53 PM

## 2020-12-04 ENCOUNTER — Inpatient Hospital Stay: Payer: Medicare HMO

## 2020-12-04 ENCOUNTER — Other Ambulatory Visit: Payer: Medicare HMO

## 2020-12-04 ENCOUNTER — Telehealth: Payer: Self-pay

## 2020-12-04 ENCOUNTER — Other Ambulatory Visit: Payer: Self-pay

## 2020-12-04 DIAGNOSIS — E119 Type 2 diabetes mellitus without complications: Secondary | ICD-10-CM | POA: Diagnosis not present

## 2020-12-04 DIAGNOSIS — Z5112 Encounter for antineoplastic immunotherapy: Secondary | ICD-10-CM | POA: Diagnosis not present

## 2020-12-04 DIAGNOSIS — R1012 Left upper quadrant pain: Secondary | ICD-10-CM | POA: Diagnosis not present

## 2020-12-04 DIAGNOSIS — D649 Anemia, unspecified: Secondary | ICD-10-CM | POA: Diagnosis not present

## 2020-12-04 DIAGNOSIS — D696 Thrombocytopenia, unspecified: Secondary | ICD-10-CM

## 2020-12-04 DIAGNOSIS — D510 Vitamin B12 deficiency anemia due to intrinsic factor deficiency: Secondary | ICD-10-CM | POA: Diagnosis not present

## 2020-12-04 DIAGNOSIS — D693 Immune thrombocytopenic purpura: Secondary | ICD-10-CM | POA: Diagnosis not present

## 2020-12-04 DIAGNOSIS — Z79899 Other long term (current) drug therapy: Secondary | ICD-10-CM | POA: Diagnosis not present

## 2020-12-04 DIAGNOSIS — I10 Essential (primary) hypertension: Secondary | ICD-10-CM | POA: Diagnosis not present

## 2020-12-04 LAB — CBC WITH DIFFERENTIAL (CANCER CENTER ONLY)
Abs Immature Granulocytes: 1.07 10*3/uL — ABNORMAL HIGH (ref 0.00–0.07)
Basophils Absolute: 0 10*3/uL (ref 0.0–0.1)
Basophils Relative: 1 %
Eosinophils Absolute: 0 10*3/uL (ref 0.0–0.5)
Eosinophils Relative: 0 %
HCT: 30.3 % — ABNORMAL LOW (ref 36.0–46.0)
Hemoglobin: 9.4 g/dL — ABNORMAL LOW (ref 12.0–15.0)
Immature Granulocytes: 12 %
Lymphocytes Relative: 7 %
Lymphs Abs: 0.6 10*3/uL — ABNORMAL LOW (ref 0.7–4.0)
MCH: 28.3 pg (ref 26.0–34.0)
MCHC: 31 g/dL (ref 30.0–36.0)
MCV: 91.3 fL (ref 80.0–100.0)
Monocytes Absolute: 2.2 10*3/uL — ABNORMAL HIGH (ref 0.1–1.0)
Monocytes Relative: 25 %
Neutro Abs: 4.8 10*3/uL (ref 1.7–7.7)
Neutrophils Relative %: 55 %
Platelet Count: 7 10*3/uL — CL (ref 150–400)
RBC: 3.32 MIL/uL — ABNORMAL LOW (ref 3.87–5.11)
RDW: 18 % — ABNORMAL HIGH (ref 11.5–15.5)
WBC Count: 8.7 10*3/uL (ref 4.0–10.5)
nRBC: 0 % (ref 0.0–0.2)

## 2020-12-04 MED ORDER — SODIUM CHLORIDE 0.9% IV SOLUTION
250.0000 mL | Freq: Once | INTRAVENOUS | Status: DC
  Filled 2020-12-04: qty 250

## 2020-12-04 NOTE — Telephone Encounter (Signed)
CRITICAL VALUE STICKER  CRITICAL VALUE: platelet 7  RECEIVER (on-site recipient of call): Leanne Chang  DATE & TIME NOTIFIED: 6/24 8:09  MESSENGER (representative from lab): Lelan Pons  MD NOTIFIED: Dr. Burr Medico  TIME OF NOTIFICATION: 8:10  RESPONSE:  MD aware

## 2020-12-04 NOTE — Progress Notes (Signed)
I spoke with [facility] regarding her nausea.  She stated she had 1 episode that lasted 30 minutes yesterday.  She sipped on some soda and then felt better.  I told her rituxamab does not usually cause nausea.  She is eating and drinking without difficulty and did not take any medication on an empty stomach.  I reviewed with Dr Burr Medico.  Dr Burr Medico is willing to prescribe antiemetic if patient wants.  I reviewed this with [facility] and she has declined antiemetic at this time.  I told her if she changes her mind to let us know.  She verbalized understanding.

## 2020-12-05 LAB — BPAM PLATELET PHERESIS
Blood Product Expiration Date: 202206252359
ISSUE DATE / TIME: 202206240845
Unit Type and Rh: 5100

## 2020-12-05 LAB — PREPARE PLATELET PHERESIS: Unit division: 0

## 2020-12-07 ENCOUNTER — Other Ambulatory Visit: Payer: Medicare HMO

## 2020-12-07 ENCOUNTER — Telehealth: Payer: Self-pay

## 2020-12-07 ENCOUNTER — Inpatient Hospital Stay: Payer: Medicare HMO

## 2020-12-07 ENCOUNTER — Other Ambulatory Visit: Payer: Self-pay

## 2020-12-07 DIAGNOSIS — D510 Vitamin B12 deficiency anemia due to intrinsic factor deficiency: Secondary | ICD-10-CM | POA: Diagnosis not present

## 2020-12-07 DIAGNOSIS — R1012 Left upper quadrant pain: Secondary | ICD-10-CM | POA: Diagnosis not present

## 2020-12-07 DIAGNOSIS — E119 Type 2 diabetes mellitus without complications: Secondary | ICD-10-CM | POA: Diagnosis not present

## 2020-12-07 DIAGNOSIS — I10 Essential (primary) hypertension: Secondary | ICD-10-CM | POA: Diagnosis not present

## 2020-12-07 DIAGNOSIS — D696 Thrombocytopenia, unspecified: Secondary | ICD-10-CM

## 2020-12-07 DIAGNOSIS — Z79899 Other long term (current) drug therapy: Secondary | ICD-10-CM | POA: Diagnosis not present

## 2020-12-07 DIAGNOSIS — D649 Anemia, unspecified: Secondary | ICD-10-CM | POA: Diagnosis not present

## 2020-12-07 DIAGNOSIS — D693 Immune thrombocytopenic purpura: Secondary | ICD-10-CM

## 2020-12-07 DIAGNOSIS — Z5112 Encounter for antineoplastic immunotherapy: Secondary | ICD-10-CM | POA: Diagnosis not present

## 2020-12-07 LAB — CMP (CANCER CENTER ONLY)
ALT: 6 U/L (ref 0–44)
AST: 15 U/L (ref 15–41)
Albumin: 2.8 g/dL — ABNORMAL LOW (ref 3.5–5.0)
Alkaline Phosphatase: 74 U/L (ref 38–126)
Anion gap: 7 (ref 5–15)
BUN: 13 mg/dL (ref 8–23)
CO2: 23 mmol/L (ref 22–32)
Calcium: 8.2 mg/dL — ABNORMAL LOW (ref 8.9–10.3)
Chloride: 107 mmol/L (ref 98–111)
Creatinine: 0.83 mg/dL (ref 0.44–1.00)
GFR, Estimated: >60 mL/min (ref >60)
Glucose, Bld: 161 mg/dL — ABNORMAL HIGH (ref 70–99)
Potassium: 4.2 mmol/L (ref 3.5–5.1)
Sodium: 137 mmol/L (ref 135–145)
Total Bilirubin: 1 mg/dL (ref 0.3–1.2)
Total Protein: 8.4 g/dL — ABNORMAL HIGH (ref 6.5–8.1)

## 2020-12-07 LAB — CBC WITH DIFFERENTIAL (CANCER CENTER ONLY)
Abs Immature Granulocytes: 1.74 10*3/uL — ABNORMAL HIGH (ref 0.00–0.07)
Basophils Absolute: 0 10*3/uL (ref 0.0–0.1)
Basophils Relative: 0 %
Eosinophils Absolute: 0 10*3/uL (ref 0.0–0.5)
Eosinophils Relative: 0 %
HCT: 29.1 % — ABNORMAL LOW (ref 36.0–46.0)
Hemoglobin: 8.9 g/dL — ABNORMAL LOW (ref 12.0–15.0)
Immature Granulocytes: 19 %
Lymphocytes Relative: 7 %
Lymphs Abs: 0.6 10*3/uL — ABNORMAL LOW (ref 0.7–4.0)
MCH: 27.9 pg (ref 26.0–34.0)
MCHC: 30.6 g/dL (ref 30.0–36.0)
MCV: 91.2 fL (ref 80.0–100.0)
Monocytes Absolute: 1.7 10*3/uL — ABNORMAL HIGH (ref 0.1–1.0)
Monocytes Relative: 18 %
Neutro Abs: 5.1 10*3/uL (ref 1.7–7.7)
Neutrophils Relative %: 56 %
Platelet Count: 7 10*3/uL — CL (ref 150–400)
RBC: 3.19 MIL/uL — ABNORMAL LOW (ref 3.87–5.11)
RDW: 18.2 % — ABNORMAL HIGH (ref 11.5–15.5)
WBC Count: 9.2 10*3/uL (ref 4.0–10.5)
nRBC: 0.3 % — ABNORMAL HIGH (ref 0.0–0.2)

## 2020-12-07 NOTE — Telephone Encounter (Signed)
Critical Value: Platelets 7,000 Dr Burr Medico notified.

## 2020-12-07 NOTE — Progress Notes (Signed)
Per Dr Burr Medico.  [facility] does not need platelet transfusion today.

## 2020-12-09 ENCOUNTER — Inpatient Hospital Stay: Payer: Medicare HMO

## 2020-12-09 ENCOUNTER — Other Ambulatory Visit: Payer: Medicare HMO

## 2020-12-09 ENCOUNTER — Other Ambulatory Visit: Payer: Self-pay

## 2020-12-09 ENCOUNTER — Inpatient Hospital Stay: Payer: Medicare HMO | Admitting: Family Medicine

## 2020-12-09 DIAGNOSIS — Z79899 Other long term (current) drug therapy: Secondary | ICD-10-CM | POA: Diagnosis not present

## 2020-12-09 DIAGNOSIS — D649 Anemia, unspecified: Secondary | ICD-10-CM | POA: Diagnosis not present

## 2020-12-09 DIAGNOSIS — D696 Thrombocytopenia, unspecified: Secondary | ICD-10-CM

## 2020-12-09 DIAGNOSIS — D510 Vitamin B12 deficiency anemia due to intrinsic factor deficiency: Secondary | ICD-10-CM | POA: Diagnosis not present

## 2020-12-09 DIAGNOSIS — D693 Immune thrombocytopenic purpura: Secondary | ICD-10-CM | POA: Diagnosis not present

## 2020-12-09 DIAGNOSIS — Z5112 Encounter for antineoplastic immunotherapy: Secondary | ICD-10-CM | POA: Diagnosis not present

## 2020-12-09 DIAGNOSIS — E119 Type 2 diabetes mellitus without complications: Secondary | ICD-10-CM | POA: Diagnosis not present

## 2020-12-09 DIAGNOSIS — I10 Essential (primary) hypertension: Secondary | ICD-10-CM | POA: Diagnosis not present

## 2020-12-09 DIAGNOSIS — R1012 Left upper quadrant pain: Secondary | ICD-10-CM | POA: Diagnosis not present

## 2020-12-09 LAB — CBC WITH DIFFERENTIAL (CANCER CENTER ONLY)
Abs Immature Granulocytes: 2.25 10*3/uL — ABNORMAL HIGH (ref 0.00–0.07)
Basophils Absolute: 0 10*3/uL (ref 0.0–0.1)
Basophils Relative: 0 %
Eosinophils Absolute: 0 10*3/uL (ref 0.0–0.5)
Eosinophils Relative: 0 %
HCT: 29.1 % — ABNORMAL LOW (ref 36.0–46.0)
Hemoglobin: 9.1 g/dL — ABNORMAL LOW (ref 12.0–15.0)
Immature Granulocytes: 20 %
Lymphocytes Relative: 8 %
Lymphs Abs: 0.9 10*3/uL (ref 0.7–4.0)
MCH: 28.5 pg (ref 26.0–34.0)
MCHC: 31.3 g/dL (ref 30.0–36.0)
MCV: 91.2 fL (ref 80.0–100.0)
Monocytes Absolute: 1.9 10*3/uL — ABNORMAL HIGH (ref 0.1–1.0)
Monocytes Relative: 17 %
Neutro Abs: 6.1 10*3/uL (ref 1.7–7.7)
Neutrophils Relative %: 55 %
Platelet Count: 5 10*3/uL — CL (ref 150–400)
RBC: 3.19 MIL/uL — ABNORMAL LOW (ref 3.87–5.11)
RDW: 18 % — ABNORMAL HIGH (ref 11.5–15.5)
WBC Count: 11.2 10*3/uL — ABNORMAL HIGH (ref 4.0–10.5)
nRBC: 0.3 % — ABNORMAL HIGH (ref 0.0–0.2)

## 2020-12-09 MED ORDER — SODIUM CHLORIDE 0.9% IV SOLUTION
250.0000 mL | Freq: Once | INTRAVENOUS | Status: AC
  Administered 2020-12-09: 250 mL via INTRAVENOUS
  Filled 2020-12-09: qty 250

## 2020-12-09 NOTE — Progress Notes (Signed)
I spoke with [facility] and let her know her appt information with Dr Linus Orn at [facility].  12/17/2020 arive at [facility].  I informed her this is an in person visit.  She verbalized understanding.  Her medical records were faxed on 12/07/2020.

## 2020-12-09 NOTE — Patient Instructions (Signed)
Blood Transfusion, Adult, Care After This sheet gives you information about how to care for yourself after your procedure. Your doctor may also give you more specific instructions. If youhave problems or questions, contact your doctor. What can I expect after the procedure? After the procedure, it is common to have: Bruising and soreness at the IV site. A fever or chills on the day of the procedure. This may be your body's response to the new blood cells received. A headache. Follow these instructions at home: Insertion site care     Follow instructions from your doctor about how to take care of your insertion site. This is where an IV tube was put into your vein. Make sure you: Wash your hands with soap and water before and after you change your bandage (dressing). If you cannot use soap and water, use hand sanitizer. Change your bandage as told by your doctor. Check your insertion site every day for signs of infection. Check for: Redness, swelling, or pain. Bleeding from the site. Warmth. Pus or a bad smell. General instructions Take over-the-counter and prescription medicines only as told by your doctor. Rest as told by your doctor. Go back to your normal activities as told by your doctor. Keep all follow-up visits as told by your doctor. This is important. Contact a doctor if: You have itching or red, swollen areas of skin (hives). You feel worried or nervous (anxious). You feel weak after doing your normal activities. You have redness, swelling, warmth, or pain around the insertion site. You have blood coming from the insertion site, and the blood does not stop with pressure. You have pus or a bad smell coming from the insertion site. Get help right away if: You have signs of a serious reaction. This may be coming from an allergy or the body's defense system (immune system). Signs include: Trouble breathing or shortness of breath. Swelling of the face or feeling warm  (flushed). Fever or chills. Head, chest, or back pain. Dark pee (urine) or blood in the pee. Widespread rash. Fast heartbeat. Feeling dizzy or light-headed. You may receive your blood transfusion in an outpatient setting. If so, youwill be told whom to contact to report any reactions. These symptoms may be an emergency. Do not wait to see if the symptoms will go away. Get medical help right away. Call your local emergency services (911 in the [place] Do not drive yourself to the hospital. Summary Bruising and soreness at the IV site are common. Check your insertion site every day for signs of infection. Rest as told by your doctor. Go back to your normal activities as told by your doctor. Get help right away if you have signs of a serious reaction. This information is not intended to replace advice given to you by your health care provider. Make sure you discuss any questions you have with your healthcare provider. Document Revised: 11/22/2018 Document Reviewed: 11/22/2018 Elsevier Patient Education  2022 Elsevier Inc.  

## 2020-12-09 NOTE — Progress Notes (Signed)
CRITICAL VALUE STICKER  CRITICAL VALUE: Platelets 5  RECEIVER (on-site recipient of call): Rodney Langton  DATE & TIME NOTIFIED: 12/09/2020 @ 0755  MESSENGER (representative from lab): Ulice Dash  MD NOTIFIED: Dr. Burr Medico  TIME OF NOTIFICATION: 8485  RESPONSE: Per Dr. Burr Medico, patient to receive platelet transfusion today.

## 2020-12-10 ENCOUNTER — Inpatient Hospital Stay: Payer: Medicare HMO

## 2020-12-10 ENCOUNTER — Other Ambulatory Visit: Payer: Medicare HMO

## 2020-12-10 VITALS — BP 114/63 | HR 79 | Temp 98.3°F | Resp 17 | Wt 166.0 lb

## 2020-12-10 DIAGNOSIS — I10 Essential (primary) hypertension: Secondary | ICD-10-CM | POA: Diagnosis not present

## 2020-12-10 DIAGNOSIS — D510 Vitamin B12 deficiency anemia due to intrinsic factor deficiency: Secondary | ICD-10-CM | POA: Diagnosis not present

## 2020-12-10 DIAGNOSIS — Z5112 Encounter for antineoplastic immunotherapy: Secondary | ICD-10-CM | POA: Diagnosis not present

## 2020-12-10 DIAGNOSIS — D693 Immune thrombocytopenic purpura: Secondary | ICD-10-CM

## 2020-12-10 DIAGNOSIS — E119 Type 2 diabetes mellitus without complications: Secondary | ICD-10-CM | POA: Diagnosis not present

## 2020-12-10 DIAGNOSIS — C9310 Chronic myelomonocytic leukemia not having achieved remission: Secondary | ICD-10-CM | POA: Diagnosis not present

## 2020-12-10 DIAGNOSIS — R1012 Left upper quadrant pain: Secondary | ICD-10-CM | POA: Diagnosis not present

## 2020-12-10 DIAGNOSIS — Z79899 Other long term (current) drug therapy: Secondary | ICD-10-CM | POA: Diagnosis not present

## 2020-12-10 DIAGNOSIS — D649 Anemia, unspecified: Secondary | ICD-10-CM | POA: Diagnosis not present

## 2020-12-10 MED ORDER — SODIUM CHLORIDE 0.9 % IV SOLN
375.0000 mg/m2 | Freq: Once | INTRAVENOUS | Status: AC
  Administered 2020-12-10: 700 mg via INTRAVENOUS
  Filled 2020-12-10: qty 50

## 2020-12-10 MED ORDER — HEPARIN SOD (PORK) LOCK FLUSH 100 UNIT/ML IV SOLN
500.0000 [IU] | Freq: Once | INTRAVENOUS | Status: DC | PRN
  Filled 2020-12-10: qty 5

## 2020-12-10 MED ORDER — ACETAMINOPHEN 325 MG PO TABS
ORAL_TABLET | ORAL | Status: AC
  Filled 2020-12-10: qty 2

## 2020-12-10 MED ORDER — SODIUM CHLORIDE 0.9 % IV SOLN
Freq: Once | INTRAVENOUS | Status: AC
  Filled 2020-12-10: qty 250

## 2020-12-10 MED ORDER — ACETAMINOPHEN 325 MG PO TABS
650.0000 mg | ORAL_TABLET | Freq: Once | ORAL | Status: AC
Start: 2020-12-10 — End: 2020-12-10
  Administered 2020-12-10: 650 mg via ORAL

## 2020-12-10 MED ORDER — DIPHENHYDRAMINE HCL 25 MG PO CAPS
50.0000 mg | ORAL_CAPSULE | Freq: Once | ORAL | Status: AC
  Administered 2020-12-10: 50 mg via ORAL

## 2020-12-10 MED ORDER — DIPHENHYDRAMINE HCL 25 MG PO CAPS
ORAL_CAPSULE | ORAL | Status: AC
  Filled 2020-12-10: qty 2

## 2020-12-10 MED ORDER — SODIUM CHLORIDE 0.9% FLUSH
10.0000 mL | INTRAVENOUS | Status: DC | PRN
  Filled 2020-12-10: qty 10

## 2020-12-10 MED ORDER — ROMIPLOSTIM INJECTION 500 MCG
8.0000 ug/kg | Freq: Once | SUBCUTANEOUS | Status: AC
Start: 2020-12-10 — End: 2020-12-10
  Administered 2020-12-10: 600 ug via SUBCUTANEOUS
  Filled 2020-12-10: qty 1

## 2020-12-10 NOTE — Patient Instructions (Addendum)
[facility]  Discharge Instructions: Thank you for choosing [facility] to provide your oncology and hematology care.   If you have a lab appointment with the [facility], please go directly to the [facility] and check in at the registration area.   Wear comfortable clothing and clothing appropriate for easy access to any Portacath or PICC line.   We strive to give you quality time with your provider. You may need to reschedule your appointment if you arrive late (15 or more minutes).  Arriving late affects you and other patients whose appointments are after yours.  Also, if you miss three or more appointments without notifying the office, you may be dismissed from the clinic at the provider's discretion.      For prescription refill requests, have your pharmacy contact our office and allow 72 hours for refills to be completed.    Today you received the following chemotherapy and/or immunotherapy agents: Rituximab.      To help prevent nausea and vomiting after your treatment, we encourage you to take your nausea medication as directed.  BELOW ARE SYMPTOMS THAT SHOULD BE REPORTED IMMEDIATELY: *FEVER GREATER THAN 100.4 F (38 C) OR HIGHER *CHILLS OR SWEATING *NAUSEA AND VOMITING THAT IS NOT CONTROLLED WITH YOUR NAUSEA MEDICATION *UNUSUAL SHORTNESS OF BREATH *UNUSUAL BRUISING OR BLEEDING *URINARY PROBLEMS (pain or burning when urinating, or frequent urination) *BOWEL PROBLEMS (unusual diarrhea, constipation, pain near the anus) TENDERNESS IN MOUTH AND THROAT WITH OR WITHOUT PRESENCE OF ULCERS (sore throat, sores in mouth, or a toothache) UNUSUAL RASH, SWELLING OR PAIN  UNUSUAL VAGINAL DISCHARGE OR ITCHING   Items with * indicate a potential emergency and should be followed up as soon as possible or go to the Emergency Department if any problems should occur.  Please show the CHEMOTHERAPY ALERT CARD or IMMUNOTHERAPY ALERT CARD at check-in to  the Emergency Department and triage nurse.  Should you have questions after your visit or need to cancel or reschedule your appointment, please contact [facility]  Dept: [phone]  and follow the prompts.  Office hours are 8:00 a.m. to 4:30 p.m. Monday - Friday. Please note that voicemails left after 4:00 p.m. may not be returned until the following business day.  We are closed weekends and major holidays. You have access to a nurse at all times for urgent questions. Please call the main number to the clinic Dept: [phone] and follow the prompts.   For any non-urgent questions, you may also contact your provider using MyChart. We now offer e-Visits for anyone 50 and older to request care online for non-urgent symptoms. For details visit [email or web address].   Also download the MyChart app! Go to the app store, search "MyChart", open the app, select [facility], and log in with your MyChart username and password.  Due to Covid, a mask is required upon entering the hospital/clinic. If you do not have a mask, one will be given to you upon arrival. For doctor visits, patients may have 1 support person aged 82 or older with them. For treatment visits, patients cannot have anyone with them due to current Covid guidelines and our immunocompromised population.   Romiplostim injection What is this medication? ROMIPLOSTIM (roe mi PLOE stim) helps your body make more platelets. This medicine is used to treat low platelets caused by chronic idiopathic thrombocytopenic purpura (ITP) or a bone marrow syndrome caused by radiationsickness. This medicine may be used for other purposes; ask  your health care provider orpharmacist if you have questions. COMMON BRAND NAME(S): Nplate What should I tell my care team before I take this medication? They need to know if you have any of these conditions: blood clots myelodysplastic syndrome an unusual or allergic reaction to  romiplostim, mannitol, other medicines, foods, dyes, or preservatives pregnant or trying to get pregnant breast-feeding How should I use this medication? This medicine is injected under the skin. It is given by a health care providerin a hospital or clinic setting. A special MedGuide will be given to you before each treatment. Be sure to readthis information carefully each time. Talk to your health care provider about the use of this medicine in children. While it may be prescribed for children as young as newborns for selectedconditions, precautions do apply. Overdosage: If you think you have taken too much of this medicine contact apoison control center or emergency room at once. NOTE: This medicine is only for you. Do not share this medicine with others. What if I miss a dose? Keep appointments for follow-up doses. It is important not to miss your dose.Call your health care provider if you are unable to keep an appointment. What may interact with this medication? Interactions are not expected. This list may not describe all possible interactions. Give your health care provider a list of all the medicines, herbs, non-prescription drugs, or dietary supplements you use. Also tell them if you smoke, drink alcohol, or use illegaldrugs. Some items may interact with your medicine. What should I watch for while using this medication? Visit your health care provider for regular checks on your progress. You may need blood work done while you are taking this medicine. Your condition will be monitored carefully while you are receiving this medicine. It is important notto miss any appointments. What side effects may I notice from receiving this medication? Side effects that you should report to your doctor or health care professionalas soon as possible: allergic reactions (skin rash, itching or hives; swelling of the face, lips, or tongue) bleeding (bloody or black, tarry stools; red or dark brown urine;  spitting up blood or brown material that looks like coffee grounds; red spots on the skin; unusual bruising or bleeding from the eyes, gums, or nose) blood clot (chest pain; shortness of breath; pain, swelling, or warmth in the leg) stroke (changes in vision; confusion; trouble speaking or understanding; severe headaches; sudden numbness or weakness of the face, arm or leg; trouble walking; dizziness; loss of balance or coordination) Side effects that usually do not require medical attention (report to yourdoctor or health care professional if they continue or are bothersome): diarrhea dizziness headache joint pain muscle pain stomach pain trouble sleeping This list may not describe all possible side effects. Call your doctor for medical advice about side effects. You may report side effects to FDA at1-800-FDA-1088. Where should I keep my medication? This medicine is given in a hospital or clinic. It will not be stored at home. NOTE: This sheet is a summary. It may not cover all possible information. If you have questions about this medicine, talk to your doctor, pharmacist, orhealth care provider.  2022 Elsevier/Gold Standard (2019-07-15 10:28:13)

## 2020-12-11 ENCOUNTER — Inpatient Hospital Stay: Payer: Medicare HMO | Attending: Hematology

## 2020-12-11 ENCOUNTER — Inpatient Hospital Stay: Payer: Medicare HMO

## 2020-12-11 ENCOUNTER — Other Ambulatory Visit: Payer: Self-pay

## 2020-12-11 ENCOUNTER — Other Ambulatory Visit: Payer: Medicare HMO

## 2020-12-11 VITALS — BP 120/68 | HR 84 | Temp 98.5°F | Resp 18

## 2020-12-11 DIAGNOSIS — Z5111 Encounter for antineoplastic chemotherapy: Secondary | ICD-10-CM | POA: Insufficient documentation

## 2020-12-11 DIAGNOSIS — I10 Essential (primary) hypertension: Secondary | ICD-10-CM | POA: Diagnosis not present

## 2020-12-11 DIAGNOSIS — C9310 Chronic myelomonocytic leukemia not having achieved remission: Secondary | ICD-10-CM | POA: Diagnosis present

## 2020-12-11 DIAGNOSIS — Z79899 Other long term (current) drug therapy: Secondary | ICD-10-CM | POA: Insufficient documentation

## 2020-12-11 DIAGNOSIS — E119 Type 2 diabetes mellitus without complications: Secondary | ICD-10-CM | POA: Diagnosis not present

## 2020-12-11 DIAGNOSIS — Z7984 Long term (current) use of oral hypoglycemic drugs: Secondary | ICD-10-CM | POA: Diagnosis not present

## 2020-12-11 DIAGNOSIS — D696 Thrombocytopenia, unspecified: Secondary | ICD-10-CM

## 2020-12-11 DIAGNOSIS — Z9884 Bariatric surgery status: Secondary | ICD-10-CM | POA: Insufficient documentation

## 2020-12-11 DIAGNOSIS — E785 Hyperlipidemia, unspecified: Secondary | ICD-10-CM | POA: Insufficient documentation

## 2020-12-11 DIAGNOSIS — D693 Immune thrombocytopenic purpura: Secondary | ICD-10-CM

## 2020-12-11 LAB — CBC WITH DIFFERENTIAL (CANCER CENTER ONLY)
Abs Immature Granulocytes: 2.2 10*3/uL — ABNORMAL HIGH (ref 0.00–0.07)
Band Neutrophils: 13 %
Basophils Absolute: 0 10*3/uL (ref 0.0–0.1)
Basophils Relative: 0 %
Eosinophils Absolute: 0 10*3/uL (ref 0.0–0.5)
Eosinophils Relative: 0 %
HCT: 29 % — ABNORMAL LOW (ref 36.0–46.0)
Hemoglobin: 8.9 g/dL — ABNORMAL LOW (ref 12.0–15.0)
Lymphocytes Relative: 7 %
Lymphs Abs: 0.8 10*3/uL (ref 0.7–4.0)
MCH: 27.8 pg (ref 26.0–34.0)
MCHC: 30.7 g/dL (ref 30.0–36.0)
MCV: 90.6 fL (ref 80.0–100.0)
Metamyelocytes Relative: 10 %
Monocytes Absolute: 1 10*3/uL (ref 0.1–1.0)
Monocytes Relative: 9 %
Myelocytes: 9 %
Neutro Abs: 7.5 10*3/uL (ref 1.7–7.7)
Neutrophils Relative %: 52 %
Platelet Count: 8 10*3/uL — CL (ref 150–400)
RBC: 3.2 MIL/uL — ABNORMAL LOW (ref 3.87–5.11)
RDW: 17.9 % — ABNORMAL HIGH (ref 11.5–15.5)
WBC Count: 11.5 10*3/uL — ABNORMAL HIGH (ref 4.0–10.5)
nRBC: 0.3 % — ABNORMAL HIGH (ref 0.0–0.2)

## 2020-12-11 LAB — PREPARE PLATELET PHERESIS
Unit division: 0
Unit division: 0

## 2020-12-11 LAB — BPAM PLATELET PHERESIS
Blood Product Expiration Date: 202206302359
Blood Product Expiration Date: 202207022359
ISSUE DATE / TIME: 202206291019
Unit Type and Rh: 5100
Unit Type and Rh: 5100

## 2020-12-11 MED ORDER — SODIUM CHLORIDE 0.9% IV SOLUTION
250.0000 mL | Freq: Once | INTRAVENOUS | Status: AC
  Administered 2020-12-11: 250 mL via INTRAVENOUS
  Filled 2020-12-11: qty 250

## 2020-12-11 NOTE — Patient Instructions (Signed)
Platelet Transfusion A platelet transfusion is a procedure in which you receive donated platelets through an IV. Platelets are tiny pieces of blood cells. When you get an injury, platelets clump together in the area to form a blood clot. This helps stop bleeding and is the beginning of the healing process. If you have too few platelets, your blood may have trouble clotting. This may cause you to bleedand bruise very easily. You may need a platelet transfusion if you have a condition that causes a low number of platelets (thrombocytopenia). A platelet transfusion may be used to stop or prevent excessive bleeding. Tell a health care provider about: Any reactions you have had during previous transfusions. Any allergies you have. All medicines you are taking, including vitamins, herbs, eye drops, creams, and over-the-counter medicines. Any blood disorders you have. Any surgeries you have had. Any medical conditions you have. Whether you are pregnant or may be pregnant. What are the risks? Generally, this is a safe procedure. However, problems may occur, including: Fever. Infection. Allergic reaction to the donor platelets. Your body's disease-fighting system (immune system) attacking the donor platelets (hemolytic reaction). This is rare. A rare reaction that causes lung damage (transfusion-related acute lung injury). What happens before the procedure? Medicines Ask your health care provider about: Changing or stopping your regular medicines. This is especially important if you are taking diabetes medicines or blood thinners. Taking medicines such as aspirin and ibuprofen. These medicines can thin your blood. Do not take these medicines unless your health care provider tells you to take them. Taking over-the-counter medicines, vitamins, herbs, and supplements. General instructions You will have a blood test to determine your blood type. Your blood type determines what kind of platelets you will  be given. Follow instructions from your health care provider about eating or drinking restrictions. If you have had an allergic reaction to a transfusion in the past, you may be given medicine to help prevent a reaction. Your temperature, blood pressure, pulse, and breathing will be monitored. What happens during the procedure?  An IV will be inserted into one of your veins. For your safety, two health care providers will verify your identity along with the donor platelets about to be infused. A bag of donor platelets will be connected to your IV. The platelets will flow into your bloodstream. This usually takes 30-60 minutes. Your temperature, blood pressure, pulse, and breathing will be monitored during the transfusion. This helps detect early signs of any reaction. You will also be monitored for other symptoms that may indicate a reaction, including chills, hives, or itching. If you have signs of a reaction at any time, your transfusion will be stopped, and you may be given medicine to help manage the reaction. When your transfusion is complete, your IV will be removed. Pressure may be applied to the IV site for a few minutes to stop any bleeding. The IV site will be covered with a bandage (dressing). The procedure may vary among health care providers and hospitals. What happens after the procedure? Your blood pressure, temperature, pulse, and breathing will be monitored until you leave the hospital or clinic. You may have some bruising and soreness at your IV site. Follow these instructions at home: Medicines Take over-the-counter and prescription medicines only as told by your health care provider. Talk with your health care provider before you take any medicines that contain aspirin or NSAIDs. These medicines increase your risk for dangerous bleeding. General instructions Change or remove your dressing as told  by your health care provider. Return to your normal activities as told by  your health care provider. Ask your health care provider what activities are safe for you. Do not take baths, swim, or use a hot tub until your health care provider approves. Ask your health care provider if you may take showers. Check your IV site every day for signs of infection. Check for: Redness, swelling, or pain. Fluid or blood. If fluid or blood drains from your IV site, use your hands to press down firmly on a bandage covering the area for a minute or two. Doing this should stop the bleeding. Warmth. Pus or a bad smell. Keep all follow-up visits as told by your health care provider. This is important. Contact a health care provider if you have: A headache that does not go away with medicine. Hives, rash, or itchy skin. Nausea or vomiting. Unusual tiredness or weakness. Signs of infection at your IV site. Get help right away if: You have a fever or chills. You urinate less often than usual. Your urine is darker colored than normal. You have any of the following: Trouble breathing. Pain in your back, abdomen, or chest. Cool, clammy skin. A fast heartbeat. Summary Platelets are tiny pieces of blood cells that clump together to form a blood clot when you have an injury. If you have too few platelets, your blood may have trouble clotting. A platelet transfusion is a procedure in which you receive donated platelets through an IV. A platelet transfusion may be used to stop or prevent excessive bleeding. After the procedure, check your IV site every day for signs of infection, including redness, swelling, pain, or warmth. This information is not intended to replace advice given to you by your health care provider. Make sure you discuss any questions you have with your healthcare provider. Document Revised: 07/05/2017 Document Reviewed: 07/05/2017 Elsevier Patient Education  2022 [employer].

## 2020-12-12 LAB — BPAM PLATELET PHERESIS
Blood Product Expiration Date: 202207022359
ISSUE DATE / TIME: 202207010918
Unit Type and Rh: 5100

## 2020-12-12 LAB — PREPARE PLATELET PHERESIS: Unit division: 0

## 2020-12-15 ENCOUNTER — Inpatient Hospital Stay: Payer: Medicare HMO

## 2020-12-15 ENCOUNTER — Other Ambulatory Visit: Payer: Self-pay

## 2020-12-15 VITALS — BP 124/62 | HR 92 | Temp 98.6°F | Resp 18

## 2020-12-15 DIAGNOSIS — D693 Immune thrombocytopenic purpura: Secondary | ICD-10-CM

## 2020-12-15 DIAGNOSIS — E119 Type 2 diabetes mellitus without complications: Secondary | ICD-10-CM | POA: Diagnosis not present

## 2020-12-15 DIAGNOSIS — Z79899 Other long term (current) drug therapy: Secondary | ICD-10-CM | POA: Diagnosis not present

## 2020-12-15 DIAGNOSIS — Z9884 Bariatric surgery status: Secondary | ICD-10-CM | POA: Diagnosis not present

## 2020-12-15 DIAGNOSIS — D696 Thrombocytopenia, unspecified: Secondary | ICD-10-CM

## 2020-12-15 DIAGNOSIS — C9310 Chronic myelomonocytic leukemia not having achieved remission: Secondary | ICD-10-CM | POA: Diagnosis not present

## 2020-12-15 DIAGNOSIS — E785 Hyperlipidemia, unspecified: Secondary | ICD-10-CM | POA: Diagnosis not present

## 2020-12-15 DIAGNOSIS — Z7984 Long term (current) use of oral hypoglycemic drugs: Secondary | ICD-10-CM | POA: Diagnosis not present

## 2020-12-15 DIAGNOSIS — I10 Essential (primary) hypertension: Secondary | ICD-10-CM | POA: Diagnosis not present

## 2020-12-15 DIAGNOSIS — Z5111 Encounter for antineoplastic chemotherapy: Secondary | ICD-10-CM | POA: Diagnosis not present

## 2020-12-15 LAB — CMP (CANCER CENTER ONLY)
ALT: 9 U/L (ref 0–44)
AST: 14 U/L — ABNORMAL LOW (ref 15–41)
Albumin: 2.9 g/dL — ABNORMAL LOW (ref 3.5–5.0)
Alkaline Phosphatase: 79 U/L (ref 38–126)
Anion gap: 8 (ref 5–15)
BUN: 17 mg/dL (ref 8–23)
CO2: 23 mmol/L (ref 22–32)
Calcium: 8.3 mg/dL — ABNORMAL LOW (ref 8.9–10.3)
Chloride: 106 mmol/L (ref 98–111)
Creatinine: 0.84 mg/dL (ref 0.44–1.00)
GFR, Estimated: >60 mL/min (ref >60)
Glucose, Bld: 209 mg/dL — ABNORMAL HIGH (ref 70–99)
Potassium: 4.3 mmol/L (ref 3.5–5.1)
Sodium: 137 mmol/L (ref 135–145)
Total Bilirubin: 0.8 mg/dL (ref 0.3–1.2)
Total Protein: 8.3 g/dL — ABNORMAL HIGH (ref 6.5–8.1)

## 2020-12-15 LAB — CBC WITH DIFFERENTIAL (CANCER CENTER ONLY)
Abs Immature Granulocytes: 1.66 10*3/uL — ABNORMAL HIGH (ref 0.00–0.07)
Basophils Absolute: 0 10*3/uL (ref 0.0–0.1)
Basophils Relative: 0 %
Eosinophils Absolute: 0 10*3/uL (ref 0.0–0.5)
Eosinophils Relative: 0 %
HCT: 27.8 % — ABNORMAL LOW (ref 36.0–46.0)
Hemoglobin: 8.7 g/dL — ABNORMAL LOW (ref 12.0–15.0)
Immature Granulocytes: 15 %
Lymphocytes Relative: 7 %
Lymphs Abs: 0.8 10*3/uL (ref 0.7–4.0)
MCH: 28.3 pg (ref 26.0–34.0)
MCHC: 31.3 g/dL (ref 30.0–36.0)
MCV: 90.6 fL (ref 80.0–100.0)
Monocytes Absolute: 2.3 10*3/uL — ABNORMAL HIGH (ref 0.1–1.0)
Monocytes Relative: 21 %
Neutro Abs: 6.4 10*3/uL (ref 1.7–7.7)
Neutrophils Relative %: 57 %
Platelet Count: 10 10*3/uL — ABNORMAL LOW (ref 150–400)
RBC: 3.07 MIL/uL — ABNORMAL LOW (ref 3.87–5.11)
RDW: 18.5 % — ABNORMAL HIGH (ref 11.5–15.5)
WBC Count: 11.3 10*3/uL — ABNORMAL HIGH (ref 4.0–10.5)
nRBC: 0.3 % — ABNORMAL HIGH (ref 0.0–0.2)

## 2020-12-15 LAB — SURGICAL PATHOLOGY

## 2020-12-15 MED ORDER — ROMIPLOSTIM INJECTION 500 MCG
9.0000 ug/kg | Freq: Once | SUBCUTANEOUS | Status: AC
Start: 2020-12-15 — End: 2020-12-15
  Administered 2020-12-15: 680 ug via SUBCUTANEOUS
  Filled 2020-12-15: qty 1

## 2020-12-15 NOTE — Progress Notes (Signed)
Per Dr. Chryl Heck and Dr. Ernestina Penna last note - patient will not receive platelet transfusion as her platelets were greater than 6. Patient is to receive Nplate today - confirmed with pharmacist.  Patient aware of the change in the treatment plan.

## 2020-12-17 DIAGNOSIS — C9310 Chronic myelomonocytic leukemia not having achieved remission: Secondary | ICD-10-CM | POA: Diagnosis not present

## 2020-12-17 DIAGNOSIS — D696 Thrombocytopenia, unspecified: Secondary | ICD-10-CM | POA: Insufficient documentation

## 2020-12-18 ENCOUNTER — Inpatient Hospital Stay: Payer: Medicare HMO

## 2020-12-18 ENCOUNTER — Telehealth: Payer: Self-pay

## 2020-12-18 ENCOUNTER — Other Ambulatory Visit: Payer: Self-pay

## 2020-12-18 ENCOUNTER — Telehealth: Payer: Self-pay | Admitting: Hematology

## 2020-12-18 ENCOUNTER — Inpatient Hospital Stay (HOSPITAL_BASED_OUTPATIENT_CLINIC_OR_DEPARTMENT_OTHER): Payer: Medicare HMO | Admitting: Hematology

## 2020-12-18 VITALS — BP 134/67 | HR 93 | Temp 98.0°F | Resp 15 | Ht 63.0 in | Wt 164.3 lb

## 2020-12-18 DIAGNOSIS — Z7984 Long term (current) use of oral hypoglycemic drugs: Secondary | ICD-10-CM | POA: Diagnosis not present

## 2020-12-18 DIAGNOSIS — D693 Immune thrombocytopenic purpura: Secondary | ICD-10-CM | POA: Diagnosis not present

## 2020-12-18 DIAGNOSIS — I10 Essential (primary) hypertension: Secondary | ICD-10-CM | POA: Diagnosis not present

## 2020-12-18 DIAGNOSIS — Z9884 Bariatric surgery status: Secondary | ICD-10-CM | POA: Diagnosis not present

## 2020-12-18 DIAGNOSIS — E119 Type 2 diabetes mellitus without complications: Secondary | ICD-10-CM | POA: Diagnosis not present

## 2020-12-18 DIAGNOSIS — C9310 Chronic myelomonocytic leukemia not having achieved remission: Secondary | ICD-10-CM | POA: Diagnosis not present

## 2020-12-18 DIAGNOSIS — Z5111 Encounter for antineoplastic chemotherapy: Secondary | ICD-10-CM | POA: Diagnosis not present

## 2020-12-18 DIAGNOSIS — Z79899 Other long term (current) drug therapy: Secondary | ICD-10-CM | POA: Diagnosis not present

## 2020-12-18 DIAGNOSIS — R7689 Other specified abnormal immunological findings in serum: Secondary | ICD-10-CM | POA: Insufficient documentation

## 2020-12-18 DIAGNOSIS — R768 Other specified abnormal immunological findings in serum: Secondary | ICD-10-CM | POA: Insufficient documentation

## 2020-12-18 DIAGNOSIS — D469 Myelodysplastic syndrome, unspecified: Secondary | ICD-10-CM

## 2020-12-18 DIAGNOSIS — E785 Hyperlipidemia, unspecified: Secondary | ICD-10-CM | POA: Diagnosis not present

## 2020-12-18 NOTE — Progress Notes (Signed)
[facility]   Telephone:([phone]) [phone] Fax:(336) [phone]   Clinic Follow up Note   Patient Care Team: [name], DO as PCP - General (Family Medicine) Loletha Carrow, Kirke Corin, MD as Consulting Physician (Gastroenterology) Truitt Merle, MD as Consulting Physician (Hematology) Alda Berthold, DO as Consulting Physician (Neurology)  Date of Service:  12/17/2020  CHIEF COMPLAINT: f/u of [name]  CURRENT THERAPY:  Weekly Rituxan X4 starting 11/19/20 -Platelet transfusions as needed when plt<10 -Blood transfusion for Hg <7.5  INTERVAL HISTORY:  Kathleen Gibson is here for a follow up. She was last seen by me on 12/02/20. She presents to the clinic alone. She met with Dr. Linus Orn yesterday (with her daughter) and relayed their discussion (his note is not available to [place] at the time of our visit)-- he discussed bone marrow transplant and the same medicine we previously discussed. She reports he was sure this is [name]. She feels she would not be able to do a bone marrow transplant, with her age and difficulty getting a match. She received platelet infusion yesterday. She has a residual bruise from her first injection.    All other systems were reviewed with the patient and are negative.  MEDICAL HISTORY:  Past Medical History:  Diagnosis Date   Diabetes ([place])    HLD (hyperlipidemia)    Hypertension    Pernicious anemia    Pneumonia 09/05/2019   Renal insufficiency 09/06/2019   Vitamin D deficiency     SURGICAL HISTORY: Past Surgical History:  Procedure Laterality Date   ABDOMINAL HERNIA REPAIR  2009   ABDOMINOPLASTY     AXILLARY LYMPH NODE BIOPSY Left 11/11/2020   Procedure: LEFT AXILLARY EXCISIONAL LYMPH NODE BIOPSY;  Surgeon: [name], MD;  Location: [facility];  Service: General;  Laterality: Left;   [place]   hemorroid surgery  2007   [place]    I have reviewed the social history and family history with the  patient and they are unchanged from previous note.  ALLERGIES:  is allergic to asa [aspirin] and nsaids.  MEDICATIONS:  Current Outpatient Medications  Medication Sig Dispense Refill   Cyanocobalamin (B-12) 1000 MCG SUBL Place 1 tablet under the tongue daily. 90 tablet 3   ferrous sulfate 325 (65 FE) MG tablet Take 1 tablet (325 mg total) by mouth daily with breakfast. (Patient taking differently: Take 650 mg by mouth daily with breakfast.) 90 tablet 3   gabapentin (NEURONTIN) 300 MG capsule 900 my QHS and 300 mg every 8 hrs in day (Patient taking differently: Take 600 mg by mouth 2 (two) times daily.) 450 capsule 1   glucose monitoring kit (FREESTYLE) monitoring kit 1 each by Does not apply route as needed for other. Use as directed 1 each 0   metoprolol succinate (TOPROL-XL) 50 MG 24 hr tablet Take 1 tablet (50 mg total) by mouth daily. 90 tablet 1   mirtazapine (REMERON SOL-TAB) 15 MG disintegrating tablet Take 1 tablet (15 mg total) by mouth at bedtime. 90 tablet 1   sitaGLIPtin-metformin (JANUMET) 50-1000 MG tablet Take 1 tablet by mouth at bedtime. 90 tablet 1   Vitamin D, Cholecalciferol, 25 MCG (1000 UT) CAPS Take 1,000 Units by mouth daily.      zolpidem (AMBIEN) 5 MG tablet Take 1 tablet (5 mg total) by mouth at bedtime. 90 tablet 0   No current facility-administered medications for this visit.    PHYSICAL  EXAMINATION: ECOG PERFORMANCE STATUS: 1 - Symptomatic but completely ambulatory  Vitals:   12/18/20 0951  BP: 134/67  Pulse: 93  Resp: 15  Temp: 98 F (36.7 C)  SpO2: 99%   Filed Weights   12/18/20 0951  Weight: 164 lb 4.8 oz (74.5 kg)    Due to COVID19 we will limit examination to appearance. Patient had no complaints.  GENERAL:alert, no distress and comfortable SKIN: skin color normal, no rashes or significant lesions EYES: normal, Conjunctiva are pink and non-injected, sclera clear  NEURO: alert & oriented x 3 with fluent speech  LABORATORY DATA:  I have  reviewed the data as listed CBC Latest Ref Rng & Units 12/15/2020 12/11/2020 12/09/2020  WBC 4.0 - 10.5 K/uL 11.3(H) 11.5(H) 11.2(H)  Hemoglobin 12.0 - 15.0 g/dL 8.7(L) 8.9(L) 9.1(L)  Hematocrit 36.0 - 46.0 % 27.8(L) 29.0(L) 29.1(L)  Platelets 150 - 400 K/uL 10(L) 8(LL) 5(LL)     CMP Latest Ref Rng & Units 12/15/2020 12/07/2020 11/30/2020  Glucose 70 - 99 mg/dL 209(H) 161(H) 153(H)  BUN 8 - 23 mg/dL _0 Creatinine 0.44 - 1.00 mg/dL 0.84 0.83 0.83  Sodium 135 - 145 mmol/L 137 137 134(L)  Potassium 3.5 - 5.1 mmol/L 4.3 4.2 4.2  Chloride 98 - 111 mmol/L 106 107 103  CO2 22 - 32 mmol/L _1 Calcium 8.9 - 10.3 mg/dL 8.3(L) 8.2(L) 8.0(L)  Total Protein 6.5 - 8.1 g/dL 8.3(H) 8.4(H) 8.5(H)  Total Bilirubin 0.3 - 1.2 mg/dL 0.8 1.0 0.8  Alkaline Phos 38 - 126 U/L 79 74 65  AST 15 - 41 U/L 14(L) 15 16  ALT 0 - 44 U/L _2 RADIOGRAPHIC STUDIES: I have personally reviewed the radiological images as listed and agreed with the findings in the report. No results found.   ASSESSMENT & PLAN:  Kathleen Gibson is a 70 y.o. female with   1. [name] with severe thrombocytopenia  -She was initially diagnosed with ITP in 2014. She lost follow up after 11/2017 and did not proceed with recommended bone marrow biopsy. -She was referred to ED 10/28/20 by her PCP for low plt count of 4k. She was hospitalized and treated with platelet transfusions, IVIG, oral prednisone 1m and Nplate injections. She tapered off prednisone given little response. -Bone marrow biopsy 10/30/20 felt to likely represent MDS/MPN, particularly [name]. -I started her on chemo treatment with weekly Rituxan X4 on 11/19/20 to treat her severe refractory thrombocytopenia but she has not responded  -[facility]. I spoke with Dr. PLinus Orn [name]  -[facility] -Given the lack of improvement on Nplate, we  are stopping this, and I have cancelled her injection today. -I agree with Dr. PRomana Juniperrecommendation of azacitadine infusion day 1-5, every 28 days  --Chemotherapy consent: Side effects including but does not not limited to, fatigue, nausea, neutropenia and sepsis, anemia and thrombocytopenia, bleeding, CHF, hypotension, renal and kidney dysfunction, were discussed with patient in great detail. She agrees to proceed. -the goal of therapy is disease control  -we particularly discussed that her anemia and thrombocytopenia will get worse after chemo,and she will require more transfusion  -will place a PICC line next week  -plan to start chemo on 7/18    2. Normocytic anemia, moderate  -she has a history of pernicious anemia/vitamin B12 deficiency -with recent severe  thrombocytopenia, she has had loq ferritin and iron study with moderate anemia. She required blood transfusion in hospital. Will give blood transfusion for Hg <=7.5.  -Hemoglobin 9.1 yesterday (12/17/20), overall stable/slightly improved    3. Type 2 diabetes mellitus with polyneuropathy, Hypertension -Continue medication, she will follow-up with her family doctor this Friday   4. Left UQ abdominal pain -Likely secondary to splenomegaly -Is tolerable, continue monitoring.    PLAN:  -will stop Nplate  -plan to start Azacitidine fusion day 1-5 every 28 days on 7/18   -PICC line placement before chemo  -lab and plt transfusion for plt<10 in a week, then twice a week after chemo  -f/u 7/18     No problem-specific Assessment & Plan notes found for this encounter.   No orders of the defined types were placed in this encounter.  All questions were answered. The patient knows to call the clinic with any problems, questions or concerns. No barriers to learning was detected. The total time spent in the appointment was 40 minutes.     Truitt Merle, MD 12/19/2020   I, Wilburn Mylar, am acting as scribe for Truitt Merle, MD.   I  have reviewed the above documentation for accuracy and completeness, and I agree with the above.

## 2020-12-18 NOTE — Progress Notes (Signed)
This nurse spoke with Beth at blood bank to make aware of patient being schedule to receive HLA matched platelets.  Dates to include 7/14, 7/21.  Then every Monday and Thursday beginning with 7/25 for 3 weeks. No further questions or concerns at this time.

## 2020-12-18 NOTE — Telephone Encounter (Signed)
This nurse left a message at [facility] for Dr. Linus Orn to return call to Dr. Burr Medico.  Provider phone number.  No further questions or concerns noted.

## 2020-12-18 NOTE — Telephone Encounter (Signed)
Scheduled appts per 7/8 sch msg. Called pt, no answer and vm was full, unable to leave a msg. Mailed updated calendar to pt.

## 2020-12-19 ENCOUNTER — Encounter: Payer: Self-pay | Admitting: Hematology

## 2020-12-19 NOTE — Progress Notes (Signed)
START OFF PATHWAY REGIMEN - Other   [id number]:Azacitidine 75 mg/m2 IV D1-5 q28 days:   A cycle is every 28 days:     Azacitidine   **Always confirm dose/schedule in your pharmacy ordering system**  Patient Characteristics: Intent of Therapy: Non-Curative / Palliative Intent, Discussed with Patient 

## 2020-12-21 NOTE — Progress Notes (Signed)
Pharmacist Chemotherapy Monitoring - Initial Assessment    Anticipated start date: 12/28/20   The following has been reviewed per standard work regarding the patient's treatment regimen: The patient's diagnosis, treatment plan and drug doses, and organ/hematologic function Lab orders and baseline tests specific to treatment regimen  The treatment plan start date, drug sequencing, and pre-medications Prior authorization status  Patient's documented medication list, including drug-drug interaction screen and prescriptions for anti-emetics and supportive care specific to the treatment regimen The drug concentrations, fluid compatibility, administration routes, and timing of the medications to be used The patient's access for treatment and lifetime cumulative dose history, if applicable  The patient's medication allergies and previous infusion related reactions, if applicable   Changes made to treatment plan:  N/A  Follow up needed:  Pending authorization for treatment    Larene [place], [place], 12/21/2020  3:53 PM

## 2020-12-22 ENCOUNTER — Encounter: Payer: Self-pay | Admitting: Family Medicine

## 2020-12-22 ENCOUNTER — Ambulatory Visit (INDEPENDENT_AMBULATORY_CARE_PROVIDER_SITE_OTHER): Payer: Medicare HMO | Admitting: Family Medicine

## 2020-12-22 ENCOUNTER — Other Ambulatory Visit: Payer: Self-pay

## 2020-12-22 VITALS — BP 117/69 | HR 97 | Temp 98.4°F | Resp 16 | Ht 63.0 in | Wt 163.2 lb

## 2020-12-22 DIAGNOSIS — E611 Iron deficiency: Secondary | ICD-10-CM | POA: Diagnosis not present

## 2020-12-22 DIAGNOSIS — E1142 Type 2 diabetes mellitus with diabetic polyneuropathy: Secondary | ICD-10-CM

## 2020-12-22 DIAGNOSIS — G4700 Insomnia, unspecified: Secondary | ICD-10-CM | POA: Diagnosis not present

## 2020-12-22 DIAGNOSIS — G609 Hereditary and idiopathic neuropathy, unspecified: Secondary | ICD-10-CM

## 2020-12-22 DIAGNOSIS — E538 Deficiency of other specified B group vitamins: Secondary | ICD-10-CM | POA: Diagnosis not present

## 2020-12-22 DIAGNOSIS — D510 Vitamin B12 deficiency anemia due to intrinsic factor deficiency: Secondary | ICD-10-CM

## 2020-12-22 DIAGNOSIS — E785 Hyperlipidemia, unspecified: Secondary | ICD-10-CM

## 2020-12-22 DIAGNOSIS — I10 Essential (primary) hypertension: Secondary | ICD-10-CM

## 2020-12-22 DIAGNOSIS — R590 Localized enlarged lymph nodes: Secondary | ICD-10-CM

## 2020-12-22 DIAGNOSIS — Z5320 Procedure and treatment not carried out because of patient's decision for unspecified reasons: Secondary | ICD-10-CM

## 2020-12-22 DIAGNOSIS — C9310 Chronic myelomonocytic leukemia not having achieved remission: Secondary | ICD-10-CM

## 2020-12-22 DIAGNOSIS — E1169 Type 2 diabetes mellitus with other specified complication: Secondary | ICD-10-CM

## 2020-12-22 DIAGNOSIS — R000 Tachycardia, unspecified: Secondary | ICD-10-CM | POA: Diagnosis not present

## 2020-12-22 DIAGNOSIS — D693 Immune thrombocytopenic purpura: Secondary | ICD-10-CM

## 2020-12-22 DIAGNOSIS — E782 Mixed hyperlipidemia: Secondary | ICD-10-CM

## 2020-12-22 MED ORDER — FERROUS SULFATE 325 (65 FE) MG PO TABS: 650.0000 mg | ORAL_TABLET | Freq: Every day | ORAL | 3 refills | Status: DC

## 2020-12-22 MED ORDER — SITAGLIPTIN PHOS-METFORMIN HCL 50-1000 MG PO TABS: 1.0000 | ORAL_TABLET | Freq: Every day | ORAL | 1 refills | Status: DC

## 2020-12-22 MED ORDER — GABAPENTIN 300 MG PO CAPS: 600.0000 mg | ORAL_CAPSULE | Freq: Two times a day (BID) | ORAL | 1 refills | Status: DC

## 2020-12-22 MED ORDER — ZOLPIDEM TARTRATE 5 MG PO TABS: 5.0000 mg | ORAL_TABLET | Freq: Every day | ORAL | 1 refills | Status: DC

## 2020-12-22 MED ORDER — METOPROLOL SUCCINATE ER 50 MG PO TB24: 50.0000 mg | ORAL_TABLET | Freq: Every day | ORAL | 1 refills | Status: DC

## 2020-12-22 MED ORDER — CYANOCOBALAMIN 1000 MCG/ML IJ SOLN
1000.0000 ug | Freq: Once | INTRAMUSCULAR | Status: AC
  Administered 2020-12-22: 1000 ug via INTRAMUSCULAR

## 2020-12-22 NOTE — Progress Notes (Signed)
Patient ID: Kathleen Gibson, female  DOB: [birth year], 70 y.o.   MRN: [id number] Patient Care Team    Relationship Specialty Notifications Start End  [name], DO PCP - General Family Medicine  11/08/17   Doran Stabler, MD Consulting Physician Gastroenterology  04/06/17   Truitt Merle, MD Consulting Physician Hematology  04/06/17   Alda Berthold, [place] Physician Neurology  01/16/19     Chief Complaint  Patient presents with   [facility]    Subjective:  Kathleen Gibson is a 70 y.o.  female present for [facility] follow up.  Diabetes: Pt reports compliance  with janumet.  Patient denies dizziness, hyperglycemic or hypoglycemic events. Patient denies numbness, worsening tingling in the extremities or nonhealing wounds of feet.  PNA: completed  Flu shot: Patient declined (recommneded yearly) Foot exam: completed 05/2020 Eye exam: 8/3/22020 completed- vision works at [employer]. Encouraged her to schedule.  Hypertension/hypertriglyceridemia Pt reports compliance  with toprol xl 50 mg qd. Patient denies chest pain, shortness of breath, dizziness or lower extremity edema.  Pt does not take a daily baby ASA (low platelets). Pt is not prescribed statin (declined). RF: Hypertension, diabetes, hyperlipidemia, obesity  INSOMNIA, CHRONIC/RLS Pt would like to return to ambien 5 mg qd. She felt the remeron did help with her weight but caused her to be sad. Gabapentin-600- 600 mg QHS is helping with her RLS.    Chronic ITP (idiopathic thrombocytopenia) (HCC)/lymphadenopathy/[name] Established with onc and receiving treatment.   b12 deficiency/pernicious anemia:   She is approved for B12 q 4 weeks indefinitely.    Depression screen [facility] 2/9 12/22/2020 10/28/2020 05/22/2020 04/15/2019 04/11/2018  Decreased Interest 2 1 0 0 0  Down, Depressed, Hopeless 1 1 0 0 -  PHQ - 2 Score 3 2 0 0 0  Altered sleeping 3 0 - - -  Tired, decreased energy 1 3 - - -  Change in appetite 0 2 - - -  Feeling bad  or failure about yourself  0 1 - - -  Trouble concentrating 1 2 - - -  Moving slowly or fidgety/restless 0 1 - - -  Suicidal thoughts 0 0 - - -  PHQ-9 Score 8 11 - - -  Some recent data might be hidden   GAD 7 : Generalized Anxiety Score 12/22/2020 10/28/2020  Nervous, Anxious, on Edge 1 1  Control/stop worrying 0 0  Worry too much - different things 0 0  Trouble relaxing 2 2  Restless 2 1  Easily annoyed or irritable 2 1  Afraid - awful might happen 0 0  Total GAD 7 Score 7 5       Fall Risk  05/22/2020 04/15/2019 01/16/2019 04/11/2018 04/06/2017  Falls in the past year? 0 0 1 No No  Number falls in past yr: 0 0 0 - -  Injury with Fall? 0 0 0 - -  Follow up Falls evaluation completed Falls evaluation completed - - -    Immunization History  Administered Date(s) Administered   PFIZER(Purple Top)SARS-COV-2 Vaccination 07/05/2019, 07/26/2019, 06/22/2020   Pneumococcal Conjugate-13 03/19/2015   Pneumococcal Polysaccharide-23 06/03/2008, 07/11/2017   Td 06/13/2006    No results found.  Past Medical History:  Diagnosis Date   Diabetes ([place])    HLD (hyperlipidemia)    Hypertension    Pernicious anemia    Pneumonia 09/05/2019   Renal insufficiency 09/06/2019   Vitamin D deficiency    Allergies  Allergen Reactions  Asa [Aspirin] Other (See Comments)    Contraindicated d/t low plts   Nsaids Other (See Comments)    Contraindicated d/t low plts   Past Surgical History:  Procedure Laterality Date   ABDOMINAL HERNIA REPAIR  2009   ABDOMINOPLASTY     AXILLARY LYMPH NODE BIOPSY Left 11/11/2020   Procedure: LEFT AXILLARY EXCISIONAL LYMPH NODE BIOPSY;  Surgeon: [name], MD;  Location: [place];  Service: General;  Laterality: Left;   [place]   GASTRIC BYPASS  2000   hemorroid surgery  2007   TONSILLECTOMY AND ADENOIDECTOMY  1957   Family History  Problem Relation Age of Onset   Cancer Mother 65       unknown type cancer    Hypertension Mother     Hypertension Father    Heart failure Father    Pneumonia Father    Diabetes Sister    Leukemia Brother    Colon cancer Brother 65   Heart Problems Brother    Leukemia Other    Diabetes Sister    Heart Problems Sister    Cancer Brother    Heart Problems Brother    Social History   Socioeconomic History   Marital status: Divorced    Spouse name: Not on file   Number of children: 3   Years of education: 12   Highest education level: Not on file  Occupational History   Occupation: Retired  Tobacco Use   Smoking status: Never   Smokeless tobacco: Never  Vaping Use   Vaping Use: Never used  Substance and Sexual Activity   Alcohol use: No   Drug use: No   Sexual activity: Not on file  Other Topics Concern   Not on file  Social History Narrative   Divorced. Retired Pension scheme manager.   12th grade education.   Drinks caffeine.   Smoke alarm in the home. Wears her seatbelt.   Feels safe in her relationships.   Two story home   Right handed    Social Determinants of Health   Financial Resource Strain: Not on file  Food Insecurity: Not on file  Transportation Needs: Not on file  Physical Activity: Not on file  Stress: Not on file  Social Connections: Not on file  Intimate Partner Violence: Not on file   Allergies as of 12/22/2020       Reactions   Asa [aspirin] Other (See Comments)   Contraindicated d/t low plts   Nsaids Other (See Comments)   Contraindicated d/t low plts        Medication List        Accurate as of December 22, 2020  4:07 PM. If you have any questions, ask your nurse or doctor.          STOP taking these medications    mirtazapine 15 MG disintegrating tablet Commonly known as: REMERON SOL-TAB Stopped by: Howard Pouch, DO       TAKE these medications    B-12 1000 MCG Subl Place 1 tablet under the tongue daily.   ferrous sulfate 325 (65 FE) MG tablet Take 2 tablets (650 mg total) by mouth daily with breakfast.   gabapentin  300 MG capsule Commonly known as: NEURONTIN Take 2 capsules (600 mg total) by mouth 2 (two) times daily.   glucose monitoring kit monitoring kit 1 each by Does not apply route as needed for other. Use as directed   metoprolol succinate 50 MG 24 hr tablet Commonly known as: TOPROL-XL  Take 1 tablet (50 mg total) by mouth daily.   sitaGLIPtin-metformin 50-1000 MG tablet Commonly known as: JANUMET Take 1 tablet by mouth at bedtime.   Vitamin D (Cholecalciferol) 25 MCG (1000 UT) Caps Take 1,000 Units by mouth daily.   zolpidem 5 MG tablet Commonly known as: AMBIEN Take 1 tablet (5 mg total) by mouth at bedtime.        All past medical history, surgical history, allergies, family history, immunizations andmedications were updated in the EMR today and reviewed under the history and medication portions of their EMR.      ROS: 14 pt review of systems performed and negative (unless mentioned in an HPI)  Objective: BP 117/69   Pulse 97   Temp 98.4 F (36.9 C) (Oral)   Resp 16   Ht $R'5\' 3"'xK$  (1.6 m)   Wt 163 lb 3.2 oz (74 kg)   SpO2 98%   BMI 28.91 kg/m  Gen: Afebrile. No acute distress. Nontoxic. Female.  HENT: AT. [place].  Eyes:Pupils Equal Round Reactive to light, Extraocular movements intact,  Conjunctiva without redness, discharge or icterus. Neck/lymp/endocrine: Supple,no lymphadenopathy, no thyromegaly CV: RRR , no edema Chest: CTAB, no wheeze or crackles Skin: no rashes, purpura or petechiae.  Neuro: Normal gait. PERLA. EOMi. Alert. Oriented x3 Psych: Normal affect, dress and demeanor. Normal speech. Normal thought content and judgment.    No results found for this or any previous visit (from the past 48 hour(s)).   Assessment/plan: CELENA LANIUS is a 70 y.o. female present for establishment of care (from [place]) and chronic medical conditions. Essential hypertension/hyperlipidemia/tachycardia/obesity/statin declined - stable. - continue metoprolol to 50 mg  QD -Discontinue HCTZ 12.5 mg daily. - Low-sodium diet, exercise.  - ASA contraindicated with thrombocytopenia.  - Discontinued fish oil (plt drop). Statin declined - F/U 4 mos  Diabetic polyneuropathy associated with type 2 diabetes mellitus ([place]) type 2 diabetes mellitus (HCC)/diabetes type 2 -Improving. - continue to decrease carb and sugar -Continue Janumet 50-1000 mg QD.  -Discontinued glyburide -Continue gabapentin (doses below) - statin declined PNA series:completed Flu shot: Patient declined (recommneded yearly) Foot exam: completed 05/2020 >podiatry referral placed for DM foot care as well. Eye exam: 8/3/22020 completed- vision works at [employer].  Courage patient to schedule eye exam. A1c: 8..6--> 8.3--> 7.9--> 7.2--> 7.3 --> 6.6 >>> 6.9>> 6.7> 7.1> 7.2 >6.3> 5.7 (due next visit) - F/U 4 mos.   INSOMNIA, CHRONIC/RLS/situational depression - returning to Ambien 5 mg qd.#90d script and 1 refill provided. -Continue gabapentin 600 BID -Continue iron twice daily.  - briefly discussed lexapro 10 mg qd to help her depression/sadness> she will think about it. We Will call in for her if she decides to try.  - tried trazodone- not effective. Tried Remeron- made her feel sad.  - [facility] database reviewed 12/22/20   B12 deficiency/pernicious anemia/IDA/: B12 injection provided today> continue q. 4 weeks per nurse visit injection Discussed purchasing sublingual OTC to have on back up if unable to get into appts.   Thrombocytopenia (HCC)/ITP//abdominal and pelvic lymphadenopathy/[name] Under the care of oncology.   .Return in about 4 months (around 04/24/2021) for [place] (30 min).  No orders of the defined types were placed in this encounter.  Meds ordered this encounter  Medications   metoprolol succinate (TOPROL-XL) 50 MG 24 hr tablet    Sig: Take 1 tablet (50 mg total) by mouth daily.    Dispense:  90 tablet    Refill:  1    Please  remind patient this is now ONCE a day  (changed from tartrate to succinate)   gabapentin (NEURONTIN) 300 MG capsule    Sig: Take 2 capsules (600 mg total) by mouth 2 (two) times daily.    Dispense:  360 capsule    Refill:  1   sitaGLIPtin-metformin (JANUMET) 50-1000 MG tablet    Sig: Take 1 tablet by mouth at bedtime.    Dispense:  90 tablet    Refill:  1   ferrous sulfate 325 (65 FE) MG tablet    Sig: Take 2 tablets (650 mg total) by mouth daily with breakfast.    Dispense:  180 tablet    Refill:  3   zolpidem (AMBIEN) 5 MG tablet    Sig: Take 1 tablet (5 mg total) by mouth at bedtime.    Dispense:  90 tablet    Refill:  1     Note is dictated utilizing voice recognition software. Although note has been proof read prior to signing, occasional typographical errors still can be missed. If any questions arise, please do not hesitate to call for verification.  Electronically signed by: Howard Pouch, DO [place]

## 2020-12-22 NOTE — Patient Instructions (Signed)
Great to see you today.  I have refilled the medication(s) we provide.   If labs were collected, we will inform you of lab results once received either by echart message or telephone call.   - echart message- for normal results that have been seen by the patient already.   - telephone call: abnormal results or if patient has not viewed results in their echart.  

## 2020-12-22 NOTE — Addendum Note (Signed)
Addended by: Kavin Leech on: 12/22/2020 04:58 PM   Modules accepted: Orders

## 2020-12-24 ENCOUNTER — Other Ambulatory Visit: Payer: Self-pay

## 2020-12-24 ENCOUNTER — Other Ambulatory Visit: Payer: Medicare HMO

## 2020-12-24 ENCOUNTER — Inpatient Hospital Stay: Payer: Medicare HMO

## 2020-12-24 ENCOUNTER — Ambulatory Visit: Payer: Medicare HMO

## 2020-12-24 DIAGNOSIS — D696 Thrombocytopenia, unspecified: Secondary | ICD-10-CM

## 2020-12-24 DIAGNOSIS — C9310 Chronic myelomonocytic leukemia not having achieved remission: Secondary | ICD-10-CM | POA: Diagnosis not present

## 2020-12-24 DIAGNOSIS — D469 Myelodysplastic syndrome, unspecified: Secondary | ICD-10-CM

## 2020-12-24 DIAGNOSIS — Z9884 Bariatric surgery status: Secondary | ICD-10-CM | POA: Diagnosis not present

## 2020-12-24 DIAGNOSIS — E119 Type 2 diabetes mellitus without complications: Secondary | ICD-10-CM | POA: Diagnosis not present

## 2020-12-24 DIAGNOSIS — Z79899 Other long term (current) drug therapy: Secondary | ICD-10-CM | POA: Diagnosis not present

## 2020-12-24 DIAGNOSIS — Z7984 Long term (current) use of oral hypoglycemic drugs: Secondary | ICD-10-CM | POA: Diagnosis not present

## 2020-12-24 DIAGNOSIS — C946 Myelodysplastic disease, not classified: Secondary | ICD-10-CM

## 2020-12-24 DIAGNOSIS — D693 Immune thrombocytopenic purpura: Secondary | ICD-10-CM

## 2020-12-24 DIAGNOSIS — E785 Hyperlipidemia, unspecified: Secondary | ICD-10-CM | POA: Diagnosis not present

## 2020-12-24 DIAGNOSIS — I10 Essential (primary) hypertension: Secondary | ICD-10-CM | POA: Diagnosis not present

## 2020-12-24 DIAGNOSIS — Z5111 Encounter for antineoplastic chemotherapy: Secondary | ICD-10-CM | POA: Diagnosis not present

## 2020-12-24 LAB — CBC WITH DIFFERENTIAL (CANCER CENTER ONLY)
Abs Immature Granulocytes: 1.7 10*3/uL — ABNORMAL HIGH (ref 0.00–0.07)
Basophils Absolute: 0.1 10*3/uL (ref 0.0–0.1)
Basophils Relative: 1 %
Eosinophils Absolute: 0 10*3/uL (ref 0.0–0.5)
Eosinophils Relative: 0 %
HCT: 30.2 % — ABNORMAL LOW (ref 36.0–46.0)
Hemoglobin: 9.5 g/dL — ABNORMAL LOW (ref 12.0–15.0)
Immature Granulocytes: 14 %
Lymphocytes Relative: 7 %
Lymphs Abs: 0.9 10*3/uL (ref 0.7–4.0)
MCH: 28.5 pg (ref 26.0–34.0)
MCHC: 31.5 g/dL (ref 30.0–36.0)
MCV: 90.7 fL (ref 80.0–100.0)
Monocytes Absolute: 3.1 10*3/uL — ABNORMAL HIGH (ref 0.1–1.0)
Monocytes Relative: 25 %
Neutro Abs: 6.7 10*3/uL (ref 1.7–7.7)
Neutrophils Relative %: 53 %
Platelet Count: 6 10*3/uL — CL (ref 150–400)
RBC: 3.33 MIL/uL — ABNORMAL LOW (ref 3.87–5.11)
RDW: 18.6 % — ABNORMAL HIGH (ref 11.5–15.5)
WBC Count: 12.4 10*3/uL — ABNORMAL HIGH (ref 4.0–10.5)
nRBC: 0.2 % (ref 0.0–0.2)

## 2020-12-24 LAB — CMP (CANCER CENTER ONLY)
ALT: 8 U/L (ref 0–44)
AST: 16 U/L (ref 15–41)
Albumin: 3.2 g/dL — ABNORMAL LOW (ref 3.5–5.0)
Alkaline Phosphatase: 86 U/L (ref 38–126)
Anion gap: 8 (ref 5–15)
BUN: 17 mg/dL (ref 8–23)
CO2: 24 mmol/L (ref 22–32)
Calcium: 8.7 mg/dL — ABNORMAL LOW (ref 8.9–10.3)
Chloride: 105 mmol/L (ref 98–111)
Creatinine: 0.87 mg/dL (ref 0.44–1.00)
GFR, Estimated: >60 mL/min (ref >60)
Glucose, Bld: 194 mg/dL — ABNORMAL HIGH (ref 70–99)
Potassium: 4.4 mmol/L (ref 3.5–5.1)
Sodium: 137 mmol/L (ref 135–145)
Total Bilirubin: 0.7 mg/dL (ref 0.3–1.2)
Total Protein: 8.6 g/dL — ABNORMAL HIGH (ref 6.5–8.1)

## 2020-12-24 MED ORDER — SODIUM CHLORIDE 0.9% IV SOLUTION
250.0000 mL | Freq: Once | INTRAVENOUS | Status: AC
  Administered 2020-12-24: 250 mL via INTRAVENOUS
  Filled 2020-12-24: qty 250

## 2020-12-24 NOTE — Patient Instructions (Signed)
Platelet Transfusion A platelet transfusion is a procedure in which you receive donated platelets through an IV. Platelets are tiny pieces of blood cells. When you get an injury, platelets clump together in the area to form a blood clot. This helps stop bleeding and is the beginning of the healing process. If you have too few platelets, your blood may have trouble clotting. This may cause you to bleedand bruise very easily. You may need a platelet transfusion if you have a condition that causes a low number of platelets (thrombocytopenia). A platelet transfusion may be used to stop or prevent excessive bleeding. Tell a health care provider about: Any reactions you have had during previous transfusions. Any allergies you have. All medicines you are taking, including vitamins, herbs, eye drops, creams, and over-the-counter medicines. Any blood disorders you have. Any surgeries you have had. Any medical conditions you have. Whether you are pregnant or may be pregnant. What are the risks? Generally, this is a safe procedure. However, problems may occur, including: Fever. Infection. Allergic reaction to the donor platelets. Your body's disease-fighting system (immune system) attacking the donor platelets (hemolytic reaction). This is rare. A rare reaction that causes lung damage (transfusion-related acute lung injury). What happens before the procedure? Medicines Ask your health care provider about: Changing or stopping your regular medicines. This is especially important if you are taking diabetes medicines or blood thinners. Taking medicines such as aspirin and ibuprofen. These medicines can thin your blood. Do not take these medicines unless your health care provider tells you to take them. Taking over-the-counter medicines, vitamins, herbs, and supplements. General instructions You will have a blood test to determine your blood type. Your blood type determines what kind of platelets you will  be given. Follow instructions from your health care provider about eating or drinking restrictions. If you have had an allergic reaction to a transfusion in the past, you may be given medicine to help prevent a reaction. Your temperature, blood pressure, pulse, and breathing will be monitored. What happens during the procedure?  An IV will be inserted into one of your veins. For your safety, two health care providers will verify your identity along with the donor platelets about to be infused. A bag of donor platelets will be connected to your IV. The platelets will flow into your bloodstream. This usually takes 30-60 minutes. Your temperature, blood pressure, pulse, and breathing will be monitored during the transfusion. This helps detect early signs of any reaction. You will also be monitored for other symptoms that may indicate a reaction, including chills, hives, or itching. If you have signs of a reaction at any time, your transfusion will be stopped, and you may be given medicine to help manage the reaction. When your transfusion is complete, your IV will be removed. Pressure may be applied to the IV site for a few minutes to stop any bleeding. The IV site will be covered with a bandage (dressing). The procedure may vary among health care providers and hospitals. What happens after the procedure? Your blood pressure, temperature, pulse, and breathing will be monitored until you leave the hospital or clinic. You may have some bruising and soreness at your IV site. Follow these instructions at home: Medicines Take over-the-counter and prescription medicines only as told by your health care provider. Talk with your health care provider before you take any medicines that contain aspirin or NSAIDs. These medicines increase your risk for dangerous bleeding. General instructions Change or remove your dressing as told  by your health care provider. Return to your normal activities as told by  your health care provider. Ask your health care provider what activities are safe for you. Do not take baths, swim, or use a hot tub until your health care provider approves. Ask your health care provider if you may take showers. Check your IV site every day for signs of infection. Check for: Redness, swelling, or pain. Fluid or blood. If fluid or blood drains from your IV site, use your hands to press down firmly on a bandage covering the area for a minute or two. Doing this should stop the bleeding. Warmth. Pus or a bad smell. Keep all follow-up visits as told by your health care provider. This is important. Contact a health care provider if you have: A headache that does not go away with medicine. Hives, rash, or itchy skin. Nausea or vomiting. Unusual tiredness or weakness. Signs of infection at your IV site. Get help right away if: You have a fever or chills. You urinate less often than usual. Your urine is darker colored than normal. You have any of the following: Trouble breathing. Pain in your back, abdomen, or chest. Cool, clammy skin. A fast heartbeat. Summary Platelets are tiny pieces of blood cells that clump together to form a blood clot when you have an injury. If you have too few platelets, your blood may have trouble clotting. A platelet transfusion is a procedure in which you receive donated platelets through an IV. A platelet transfusion may be used to stop or prevent excessive bleeding. After the procedure, check your IV site every day for signs of infection, including redness, swelling, pain, or warmth. This information is not intended to replace advice given to you by your health care provider. Make sure you discuss any questions you have with your healthcare provider. Document Revised: 07/05/2017 Document Reviewed: 07/05/2017 Elsevier Patient Education  2022 [employer].

## 2020-12-25 ENCOUNTER — Telehealth: Payer: Self-pay

## 2020-12-25 LAB — PREPARE PLATELET PHERESIS: Unit division: 0

## 2020-12-25 LAB — BPAM PLATELET PHERESIS
Blood Product Expiration Date: 202207152359
ISSUE DATE / TIME: 202207141426
Unit Type and Rh: 6200

## 2020-12-25 NOTE — Telephone Encounter (Signed)
This nurse attempting to reach patient to inform about appointment with IR for PICC line placement.  No answer at this time  will attempt to call again.  Will also reach out to patients daughter.

## 2020-12-25 NOTE — Telephone Encounter (Signed)
This nurse attempted to reach this patient related to IR appointment on 12/28/20 at 930 am.  Unable to  leave a message.  This nurse also attempted to reach her daughter as well.  Left a second message on her daughters phone.

## 2020-12-25 NOTE — Telephone Encounter (Signed)
Left message for daughter related to appointment for PICC line placement.

## 2020-12-28 ENCOUNTER — Inpatient Hospital Stay: Payer: Medicare HMO

## 2020-12-28 ENCOUNTER — Other Ambulatory Visit: Payer: Self-pay | Admitting: Hematology

## 2020-12-28 ENCOUNTER — Other Ambulatory Visit: Payer: Self-pay

## 2020-12-28 ENCOUNTER — Encounter: Payer: Self-pay | Admitting: Hematology

## 2020-12-28 ENCOUNTER — Inpatient Hospital Stay (HOSPITAL_BASED_OUTPATIENT_CLINIC_OR_DEPARTMENT_OTHER): Payer: Medicare HMO | Admitting: Hematology

## 2020-12-28 ENCOUNTER — Ambulatory Visit (HOSPITAL_COMMUNITY)
Admission: RE | Admit: 2020-12-28 | Discharge: 2020-12-28 | Disposition: A | Discharge status: Home / Self Care | Payer: Medicare HMO | Source: Ambulatory Visit | Attending: Hematology | Admitting: Hematology

## 2020-12-28 VITALS — BP 108/61 | HR 85 | Temp 98.9°F | Resp 17

## 2020-12-28 VITALS — BP 121/61 | HR 90 | Temp 98.8°F | Resp 18 | Ht 63.0 in | Wt 163.5 lb

## 2020-12-28 DIAGNOSIS — Z9884 Bariatric surgery status: Secondary | ICD-10-CM | POA: Diagnosis not present

## 2020-12-28 DIAGNOSIS — C9310 Chronic myelomonocytic leukemia not having achieved remission: Secondary | ICD-10-CM | POA: Diagnosis not present

## 2020-12-28 DIAGNOSIS — E785 Hyperlipidemia, unspecified: Secondary | ICD-10-CM | POA: Diagnosis not present

## 2020-12-28 DIAGNOSIS — C946 Myelodysplastic disease, not classified: Secondary | ICD-10-CM | POA: Diagnosis not present

## 2020-12-28 DIAGNOSIS — D696 Thrombocytopenia, unspecified: Secondary | ICD-10-CM

## 2020-12-28 DIAGNOSIS — Z5111 Encounter for antineoplastic chemotherapy: Secondary | ICD-10-CM | POA: Diagnosis not present

## 2020-12-28 DIAGNOSIS — I10 Essential (primary) hypertension: Secondary | ICD-10-CM | POA: Diagnosis not present

## 2020-12-28 DIAGNOSIS — D693 Immune thrombocytopenic purpura: Secondary | ICD-10-CM

## 2020-12-28 DIAGNOSIS — Z452 Encounter for adjustment and management of vascular access device: Secondary | ICD-10-CM | POA: Diagnosis not present

## 2020-12-28 DIAGNOSIS — Z79899 Other long term (current) drug therapy: Secondary | ICD-10-CM | POA: Diagnosis not present

## 2020-12-28 DIAGNOSIS — D469 Myelodysplastic syndrome, unspecified: Secondary | ICD-10-CM

## 2020-12-28 DIAGNOSIS — Z7984 Long term (current) use of oral hypoglycemic drugs: Secondary | ICD-10-CM | POA: Diagnosis not present

## 2020-12-28 DIAGNOSIS — E119 Type 2 diabetes mellitus without complications: Secondary | ICD-10-CM | POA: Diagnosis not present

## 2020-12-28 LAB — CBC WITH DIFFERENTIAL (CANCER CENTER ONLY)
Abs Immature Granulocytes: 1.35 10*3/uL — ABNORMAL HIGH (ref 0.00–0.07)
Basophils Absolute: 0.1 10*3/uL (ref 0.0–0.1)
Basophils Relative: 1 %
Eosinophils Absolute: 0.3 10*3/uL (ref 0.0–0.5)
Eosinophils Relative: 3 %
HCT: 27.6 % — ABNORMAL LOW (ref 36.0–46.0)
Hemoglobin: 8.7 g/dL — ABNORMAL LOW (ref 12.0–15.0)
Immature Granulocytes: 13 %
Lymphocytes Relative: 7 %
Lymphs Abs: 0.8 10*3/uL (ref 0.7–4.0)
MCH: 28.4 pg (ref 26.0–34.0)
MCHC: 31.5 g/dL (ref 30.0–36.0)
MCV: 90.2 fL (ref 80.0–100.0)
Monocytes Absolute: 2.5 10*3/uL — ABNORMAL HIGH (ref 0.1–1.0)
Monocytes Relative: 23 %
Neutro Abs: 5.6 10*3/uL (ref 1.7–7.7)
Neutrophils Relative %: 53 %
Platelet Count: <5 10*3/uL — CL (ref 150–400)
RBC: 3.06 MIL/uL — ABNORMAL LOW (ref 3.87–5.11)
RDW: 18.6 % — ABNORMAL HIGH (ref 11.5–15.5)
WBC Count: 10.5 10*3/uL (ref 4.0–10.5)
nRBC: 0.3 % — ABNORMAL HIGH (ref 0.0–0.2)

## 2020-12-28 LAB — CMP (CANCER CENTER ONLY)
ALT: 11 U/L (ref 0–44)
AST: 16 U/L (ref 15–41)
Albumin: 3.2 g/dL — ABNORMAL LOW (ref 3.5–5.0)
Alkaline Phosphatase: 80 U/L (ref 38–126)
Anion gap: 6 (ref 5–15)
BUN: 14 mg/dL (ref 8–23)
CO2: 25 mmol/L (ref 22–32)
Calcium: 8.3 mg/dL — ABNORMAL LOW (ref 8.9–10.3)
Chloride: 107 mmol/L (ref 98–111)
Creatinine: 0.79 mg/dL (ref 0.44–1.00)
GFR, Estimated: >60 mL/min (ref >60)
Glucose, Bld: 146 mg/dL — ABNORMAL HIGH (ref 70–99)
Potassium: 4.1 mmol/L (ref 3.5–5.1)
Sodium: 138 mmol/L (ref 135–145)
Total Bilirubin: 0.7 mg/dL (ref 0.3–1.2)
Total Protein: 8.4 g/dL — ABNORMAL HIGH (ref 6.5–8.1)

## 2020-12-28 MED ORDER — SODIUM CHLORIDE 0.9% IV SOLUTION
250.0000 mL | Freq: Once | INTRAVENOUS | Status: DC
  Filled 2020-12-28: qty 250

## 2020-12-28 MED ORDER — SODIUM CHLORIDE 0.9% FLUSH
10.0000 mL | INTRAVENOUS | Status: DC | PRN
  Administered 2020-12-28: 10 mL
  Filled 2020-12-28: qty 10

## 2020-12-28 MED ORDER — LIDOCAINE HCL 1 % IJ SOLN
INTRAMUSCULAR | Status: AC
  Filled 2020-12-28: qty 20

## 2020-12-28 MED ORDER — LIDOCAINE HCL 1 % IJ SOLN
INTRAMUSCULAR | Status: AC | PRN
  Administered 2020-12-28: 5 mL

## 2020-12-28 MED ORDER — SODIUM CHLORIDE 0.9 % IV SOLN
Freq: Once | INTRAVENOUS | Status: DC
  Filled 2020-12-28: qty 250

## 2020-12-28 MED ORDER — HEPARIN SOD (PORK) LOCK FLUSH 100 UNIT/ML IV SOLN
500.0000 [IU] | Freq: Once | INTRAVENOUS | Status: AC | PRN
  Administered 2020-12-28: 500 [IU]
  Filled 2020-12-28: qty 5

## 2020-12-28 MED ORDER — PALONOSETRON HCL INJECTION 0.25 MG/5ML
INTRAVENOUS | Status: AC
  Filled 2020-12-28: qty 5

## 2020-12-28 MED ORDER — PALONOSETRON HCL INJECTION 0.25 MG/5ML
0.2500 mg | Freq: Once | INTRAVENOUS | Status: AC
  Administered 2020-12-28: 0.25 mg via INTRAVENOUS

## 2020-12-28 MED ORDER — SODIUM CHLORIDE 0.9 % IV SOLN
75.0000 mg/m2 | Freq: Once | INTRAVENOUS | Status: AC
  Administered 2020-12-28: 135 mg via INTRAVENOUS
  Filled 2020-12-28: qty 13.5

## 2020-12-28 MED ORDER — ROMIPLOSTIM INJECTION 500 MCG
10.0000 ug/kg | Freq: Once | SUBCUTANEOUS | Status: AC
  Administered 2020-12-28: 740 ug via SUBCUTANEOUS
  Filled 2020-12-28: qty 0.5

## 2020-12-28 MED ORDER — SODIUM CHLORIDE 0.9 % IV SOLN
10.0000 mg | Freq: Once | INTRAVENOUS | Status: AC
  Administered 2020-12-28: 10 mg via INTRAVENOUS
  Filled 2020-12-28: qty 10

## 2020-12-28 MED ORDER — SODIUM CHLORIDE 0.9% IV SOLUTION
250.0000 mL | Freq: Once | INTRAVENOUS | Status: AC
  Administered 2020-12-28: 250 mL via INTRAVENOUS
  Filled 2020-12-28: qty 250

## 2020-12-28 NOTE — Progress Notes (Signed)
[place]   [phone] [phone]   Clinic Follow up Note   Patient Care Team: [name], DO as PCP - General (Family Medicine) Loletha Carrow, Kirke Corin, MD as Consulting Physician (Gastroenterology) Truitt Merle, MD as Consulting Physician (Hematology) Alda Berthold, DO as Consulting Physician (Neurology)  Date of Service:  12/28/2020  CHIEF COMPLAINT: f/u of [name], severe thrombocytopenia  SUMMARY OF ONCOLOGIC HISTORY: Oncology History  [name] (chronic myelomonocytic leukemia) ([place])  12/17/2020 Initial Diagnosis   [name] (chronic myelomonocytic leukemia) ([place])    12/28/2020 -  Chemotherapy    Patient is on Treatment Plan: MYELODYSPLASIA  AZACITIDINE IV D1-5 Q28D          CURRENT THERAPY:  -Platelet transfusions as needed when plt<10, twice a week  -Blood transfusion for Hg <7.5 -Azacitidine dialy on day 1-5 every 28 days, starting today   INTERVAL HISTORY:  Kathleen Gibson is here for a follow up of [name]. She was last seen by me on 12/18/20. She presents to the clinic alone. She reports increased headaches lately, no vision change or any other neurological symptoms. She has mild small bruises on arms, no other active bleeding. Moderate fatigue is stable, she is able to function well at home.  No fever or chills.   All other systems were reviewed with the patient and are negative.  MEDICAL HISTORY:  Past Medical History:  Diagnosis Date   Diabetes ([place])    HLD (hyperlipidemia)    Hypertension    Pernicious anemia    Pneumonia 09/05/2019   Renal insufficiency 09/06/2019   Vitamin D deficiency     SURGICAL HISTORY: Past Surgical History:  Procedure Laterality Date   ABDOMINAL HERNIA REPAIR  2009   ABDOMINOPLASTY     AXILLARY LYMPH NODE BIOPSY Left 11/11/2020   Procedure: LEFT AXILLARY EXCISIONAL LYMPH NODE BIOPSY;  Surgeon: [name], MD;  Location: [facility];  Service: General;  Laterality: Left;   [place]   hemorroid surgery  2007   [place]    I have reviewed the social history and family history with the patient and they are unchanged from previous note.  ALLERGIES:  is allergic to asa [aspirin] and nsaids.  MEDICATIONS:  Current Outpatient Medications  Medication Sig Dispense Refill   Cyanocobalamin (B-12) 1000 MCG SUBL Place 1 tablet under the tongue daily. (Patient not taking: Reported on 12/22/2020) 90 tablet 3   ferrous sulfate 325 (65 FE) MG tablet Take 2 tablets (650 mg total) by mouth daily with breakfast. 180 tablet 3   gabapentin (NEURONTIN) 300 MG capsule Take 2 capsules (600 mg total) by mouth 2 (two) times daily. 360 capsule 1   glucose monitoring kit (FREESTYLE) monitoring kit 1 each by Does not apply route as needed for other. Use as directed 1 each 0   metoprolol succinate (TOPROL-XL) 50 MG 24 hr tablet Take 1 tablet (50 mg total) by mouth daily. 90 tablet 1   sitaGLIPtin-metformin (JANUMET) 50-1000 MG tablet Take 1 tablet by mouth at bedtime. 90 tablet 1   Vitamin D, Cholecalciferol, 25 MCG (1000 UT) CAPS Take 1,000 Units by mouth daily.      zolpidem (AMBIEN) 5 MG tablet Take 1 tablet (5 mg total) by mouth at bedtime. 90 tablet 1   No current facility-administered medications for this visit.   Facility-Administered Medications Ordered in Other Visits  Medication Dose Route Frequency Provider Last Rate  Last Admin   0.9 %  sodium chloride infusion (Manually program via Guardrails IV Fluids)  250 mL Intravenous Once Truitt Merle, MD       0.9 %  sodium chloride infusion   Intravenous Once Truitt Merle, MD       azaCITIDine [facility]) 135 mg in sodium chloride 0.9 % 50 mL chemo infusion  75 mg/m2 (Treatment Plan Recorded) Intravenous Once Truitt Merle, MD       heparin lock flush 100 unit/mL  500 Units Intracatheter Once PRN Truitt Merle, MD       lidocaine (XYLOCAINE) 1 % (with pres) injection            romiPLOStim (NPLATE) injection 740 mcg   10 mcg/kg Subcutaneous Once Truitt Merle, MD       sodium chloride flush (NS) 0.9 % injection 10 mL  10 mL Intracatheter PRN Truitt Merle, MD        PHYSICAL EXAMINATION: ECOG PERFORMANCE STATUS: 1 - Symptomatic but completely ambulatory  Vitals:   12/28/20 1103  BP: 121/61  Pulse: 90  Resp: 18  Temp: 98.8 F (37.1 C)  SpO2: 100%   Filed Weights   12/28/20 1103  Weight: 163 lb 8 oz (74.2 kg)    Due to COVID19 we will limit examination to appearance. Patient had no complaints.  GENERAL:alert, no distress and comfortable SKIN: skin color normal, no rashes or significant lesions EYES: normal, Conjunctiva are pink and non-injected, sclera clear  NEURO: alert & oriented x 3 with fluent speech  LABORATORY DATA:  I have reviewed the data as listed CBC Latest Ref Rng & Units 12/28/2020 12/24/2020 12/15/2020  WBC 4.0 - 10.5 K/uL 10.5 12.4(H) 11.3(H)  Hemoglobin 12.0 - 15.0 g/dL 8.7(L) 9.5(L) 8.7(L)  Hematocrit 36.0 - 46.0 % 27.6(L) 30.2(L) 27.8(L)  Platelets 150 - 400 K/uL <5(LL) 6(LL) 10(L)     CMP Latest Ref Rng & Units 12/28/2020 12/24/2020 12/15/2020  Glucose 70 - 99 mg/dL 146(H) 194(H) 209(H)  BUN 8 - 23 mg/dL $Remove'14 17 17  'qhqbEsv$ Creatinine 0.44 - 1.00 mg/dL 0.79 0.87 0.84  Sodium 135 - 145 mmol/L 138 137 137  Potassium 3.5 - 5.1 mmol/L 4.1 4.4 4.3  Chloride 98 - 111 mmol/L 107 105 106  CO2 22 - 32 mmol/L $RemoveB'25 24 23  'pTVkdeUE$ Calcium 8.9 - 10.3 mg/dL 8.3(L) 8.7(L) 8.3(L)  Total Protein 6.5 - 8.1 g/dL 8.4(H) 8.6(H) 8.3(H)  Total Bilirubin 0.3 - 1.2 mg/dL 0.7 0.7 0.8  Alkaline Phos 38 - 126 U/L 80 86 79  AST 15 - 41 U/L 16 16 14(L)  ALT 0 - 44 U/L $Remo'11 8 9      'WlfwQ$ RADIOGRAPHIC STUDIES: I have personally reviewed the radiological images as listed and agreed with the findings in the report. IR PICC PLACEMENT RIGHT >5 YRS INC IMG GUIDE  Result Date: 12/28/2020 INDICATION: Myeloproliferative disorder. Request for PICC line placement for intravenous therapies. EXAM: ULTRASOUND AND FLUOROSCOPIC GUIDED RIGHT  UPPER EXTREMITY PICC LINE INSERTION MEDICATIONS: 1% plain lidocaine, 1 mL CONTRAST:  None FLUOROSCOPY TIME:  Twelve seconds COMPLICATIONS: None immediate. TECHNIQUE: The procedure, risks, benefits, and alternatives were explained to the patient and informed written consent was obtained. A timeout was performed prior to the initiation of the procedure. The right upper extremity was prepped with chlorhexidine in a sterile fashion, and a sterile drape was applied covering the operative field. Maximum barrier sterile technique with sterile gowns and gloves were used for the procedure. A timeout was performed prior to the  initiation of the procedure. Local anesthesia was provided with 1% lidocaine. Under direct ultrasound guidance, the basilic vein was accessed with a micropuncture kit after the overlying soft tissues were anesthetized with 1% lidocaine. After the overlying soft tissues were anesthetized, a small venotomy incision was created and a micropuncture kit was utilized to access the right basilic vein. Real-time ultrasound guidance was utilized for vascular access including the acquisition of a permanent ultrasound image documenting patency of the accessed vessel. A guidewire was advanced to the level of the superior caval-atrial junction for measurement purposes and the PICC line was cut to length. A peel-away sheath was placed and a 35 cm, 5 [place], dual lumen was inserted to level of the superior caval-atrial junction. A post procedure spot fluoroscopic was obtained. The catheter easily aspirated and flushed and was secured in place. A dressing was placed. The patient tolerated the procedure well without immediate post procedural complication. FINDINGS: After catheter placement, the tip lies within the superior cavoatrial junction. The catheter aspirates and flushes normally and is ready for immediate use. IMPRESSION: Successful ultrasound and fluoroscopic guided placement of a right basilic vein approach,  35 cm, 5 French, dual lumen PICC with tip at the superior caval-atrial junction. The PICC line is ready for immediate use. Read by: Ascencion Dike PA-C Electronically Signed   By: Aletta Edouard M.D.   On: 12/28/2020 09:51     ASSESSMENT & PLAN:  Kathleen Gibson is a 70 y.o. female with   1. [name] with severe thrombocytopenia  -She was initially diagnosed with ITP in 2014. She lost follow up after 11/2017 and did not proceed with recommended bone marrow biopsy. -She was referred to ED 10/28/20 by her PCP for low plt count of 4k. She was hospitalized and treated with platelet transfusions, IVIG, oral prednisone $RemoveBeforeD'60mg'[id number]$  and Nplate injections. She tapered off prednisone given little response. -Bone marrow biopsy 10/30/20 felt to likely represent MDS/MPN, particularly [name]. -I started her on chemo treatment with weekly Rituxan X4 on 11/19/20 to treat her severe refractory thrombocytopenia but she has not responded  -She consulted with Dr. Linus Orn at [facility] yesterday, 12/17/20. I spoke with Dr. Linus Orn, her bone marrow slides were reviewed at [facility] and confirmed [name]  -Plan to start azacitidine today, daily infusion on day 1-5 every 28 days.  Potential benefit and side effects reviewed with her again, she agrees to proceed. -Her platelet counts less than 5 today, given the worsening platelet count after stopping Nplate last week, I will restart Nplate today at maximal dose 72mcg/kg weekly, she previously tolerated injection very well, last dose was 9 mcg/kg -f/u next week  -she had PICC line placed this morning    2. Normocytic anemia, moderate  -she has a history of pernicious anemia/vitamin B12 deficiency -with recent severe thrombocytopenia, she has had loq ferritin and iron study with moderate anemia. She required blood transfusion in hospital. Will give blood transfusion for Hg <=7.5.  -Hemoglobin 8.7 today, no need for blood transfusion   3. Type 2 diabetes mellitus with polyneuropathy, Hypertension -Continue  medication, she will follow-up with her family doctor   4. Left UQ abdominal pain -Likely secondary to splenomegaly -Is tolerable, continue monitoring.  5. Headache -no other neurological symptoms, will continue monitor clinically, take Tylenol as needed -If gets worse, will get a CT head to rule intracranial hemorrhage     PLAN:  -begin with Azacitidine fusion day 1-5 every 28 days today  -proceed with platelet transfusion today and continue  every Mondays and Thursdays if platelet count less than 10K -restart Nplate today. -f/u next week      No problem-specific Assessment & Plan notes found for this encounter.   No orders of the defined types were placed in this encounter.  All questions were answered. The patient knows to call the clinic with any problems, questions or concerns. No barriers to learning was detected. The total time spent in the appointment was 30 minutes.     Truitt Merle, MD 12/28/2020   I, Wilburn Mylar, am acting as scribe for Truitt Merle, MD.   I have reviewed the above documentation for accuracy and completeness, and I agree with the above.

## 2020-12-28 NOTE — Procedures (Signed)
Successful placement of dual lumen PICC line to right basilic vein. Length 35 cm Tip at lower SVC/RA PICC capped No complications Ready for use.  EBL < 5 mL   Ascencion Dike PA-C 12/28/2020 9:48 AM

## 2020-12-28 NOTE — Patient Instructions (Signed)
[facility]  Discharge Instructions: Thank you for choosing [facility] to provide your oncology and hematology care.   If you have a lab appointment with the [facility], please go directly to the [facility] and check in at the registration area.   Wear comfortable clothing and clothing appropriate for easy access to any Portacath or PICC line.   We strive to give you quality time with your provider. You may need to reschedule your appointment if you arrive late (15 or more minutes).  Arriving late affects you and other patients whose appointments are after yours.  Also, if you miss three or more appointments without notifying the office, you may be dismissed from the clinic at the provider's discretion.      For prescription refill requests, have your pharmacy contact our office and allow 72 hours for refills to be completed.    Today you received the following chemotherapy and/or immunotherapy agents Azacitadine      To help prevent nausea and vomiting after your treatment, we encourage you to take your nausea medication as directed.  BELOW ARE SYMPTOMS THAT SHOULD BE REPORTED IMMEDIATELY: *FEVER GREATER THAN 100.4 F (38 C) OR HIGHER *CHILLS OR SWEATING *NAUSEA AND VOMITING THAT IS NOT CONTROLLED WITH YOUR NAUSEA MEDICATION *UNUSUAL SHORTNESS OF BREATH *UNUSUAL BRUISING OR BLEEDING *URINARY PROBLEMS (pain or burning when urinating, or frequent urination) *BOWEL PROBLEMS (unusual diarrhea, constipation, pain near the anus) TENDERNESS IN MOUTH AND THROAT WITH OR WITHOUT PRESENCE OF ULCERS (sore throat, sores in mouth, or a toothache) UNUSUAL RASH, SWELLING OR PAIN  UNUSUAL VAGINAL DISCHARGE OR ITCHING   Items with * indicate a potential emergency and should be followed up as soon as possible or go to the Emergency Department if any problems should occur.  Please show the CHEMOTHERAPY ALERT CARD or IMMUNOTHERAPY ALERT CARD at check-in to  the Emergency Department and triage nurse.  Should you have questions after your visit or need to cancel or reschedule your appointment, please contact [facility]  Dept: [phone]  and follow the prompts.  Office hours are 8:00 a.m. to 4:30 p.m. Monday - Friday. Please note that voicemails left after 4:00 p.m. may not be returned until the following business day.  We are closed weekends and major holidays. You have access to a nurse at all times for urgent questions. Please call the main number to the clinic Dept: [phone] and follow the prompts.   For any non-urgent questions, you may also contact your provider using MyChart. We now offer e-Visits for anyone 6 and older to request care online for non-urgent symptoms. For details visit [email or web address].   Also download the MyChart app! Go to the app store, search "MyChart", open the app, select [facility], and log in with your MyChart username and password.  Due to Covid, a mask is required upon entering the hospital/clinic. If you do not have a mask, one will be given to you upon arrival. For doctor visits, patients may have 1 support person aged 56 or older with them. For treatment visits, patients cannot have anyone with them due to current Covid guidelines and our immunocompromised population.    Azacitidine Oral Tablets What is this medication? AZACITIDINE (ay [facility]) is a chemotherapy drug. This medicine reduces the growth of cancer cells and can suppress the immune system. It is used totreat acute myelogenous leukemia. This medicine may be used for other purposes; ask your  health care provider orpharmacist if you have questions. COMMON BRAND NAME(S): ONUREG What should I tell my care team before I take this medication? They need to know if you have any of these conditions: kidney disease liver disease an unusual or allergic reaction to azacitidine, other medicines, foods, dyes, or  preservatives pregnant or trying to get pregnant breast-feeding How should I use this medication? Take this medicine by mouth with a glass of water. Follow the directions on the prescription label. Do not cut, crush or chew this medicine. Swallow the capsules whole. You can take this medicine with or without food. However, you should always take it the same way. Take your medicine at regular intervals. Do not take it more often than directed. Do not skip stop taking except on yourdoctor's advice. Talk to your pediatrician about the use of this medicine in children. Specialcare may be needed. Overdosage: If you think you have taken too much of this medicine contact apoison control center or emergency room at once. NOTE: This medicine is only for you. Do not share this medicine with others. What if I miss a dose? If you miss a dose, take it as soon as you can. If it is almost time for yournext dose, take only that dose. Do not take double or extra doses. What may interact with this medication? Interactions are not expected. This list may not describe all possible interactions. Give your health care provider a list of all the medicines, herbs, non-prescription drugs, or dietary supplements you use. Also tell them if you smoke, drink alcohol, or use illegaldrugs. Some items may interact with your medicine. What should I watch for while using this medication? Visit your doctor for checks on your progress. This drug may make you feel generally unwell. This is not uncommon, as chemotherapy can affect healthy cells as well as cancer cells. Report any side effects. Continue your course oftreatment even though you feel ill unless your doctor tells you to stop. In some cases, you may be given additional medicines to help with side effects.Follow all directions for their use. Call your doctor or health care professional for advice if you get a fever, chills or sore throat, or other symptoms of a cold or flu. Do  not treat yourself. This drug decreases your body's ability to fight infections. Try toavoid being around people who are sick. This medicine may increase your risk to bruise or bleed. Call your doctor orhealth care professional if you notice any unusual bleeding. You may need blood work done while you are taking this medicine. Do not become pregnant while taking this medicine and for at least 6 months after the last dose. Women should inform their doctor if they wish to become pregnant or think they might be pregnant. Men should not father a child while taking this medicine and for at least 3 months after the last dose. There is a potential for serious side effects to an unborn child. Talk to your health care professional or pharmacist for more information. Do not breast-feed an infantwhile taking this medicine and for 1 week after the last dose. This medicine may interfere with the ability to have a child. Talk with yourdoctor or health care professional if you are concerned about your fertility. What side effects may I notice from receiving this medication? Side effects that you should report to your doctor or health care professionalas soon as possible: allergic reactions like skin rash, itching or hives, swelling of the face, lips, or tongue  fever or chills, sore throat signs and symptoms of infection like fever; chills; cough; sore throat; pain or trouble passing urine signs and symptoms of low red blood cells or anemia such as unusually weak or tired; feeling faint or lightheaded; falls; breathing problems unusual bruising or bleeding Side effects that usually do not require medical attention (report these toyour doctor or health care professional if they continue or are bothersome): constipation decreased appetite diarrhea nausea, vomiting stomach pain tiredness This list may not describe all possible side effects. Call your doctor for medical advice about side effects. You may report side  effects to FDA at1-800-FDA-1088. Where should I keep my medication? Keep out of the reach of children and pets. Store between 20 and 25 degrees C (68 and 77 degrees F). Keep this medicine in the original container or packaging. Keep container tightly closed. Do not throw out the packet in the container. It keeps the medicine dry. Get rid ofany unused medicine after the expiration date. To get rid of medicines that are no longer needed or have expired: Take the medicine to a medicine take-back program. Check with your pharmacy or law enforcement to find a location. If you cannot return the medicine, check the label or package insert to see if the medicine should be thrown out in the garbage or flushed down the toilet. If you are not sure, ask your health care provider. If it is safe to put it in the trash, take the medicine out of the container. Mix the medicine with cat litter, dirt, coffee grounds, or other unwanted substance. Seal the mixture in a bag or container. Put it in the trash. NOTE: This sheet is a summary. It may not cover all possible information. If you have questions about this medicine, talk to your doctor, pharmacist, orhealth care provider.  2022 Elsevier/Gold Standard (2019-12-30 10:39:52)  Blood Transfusion, Adult, Care After This sheet gives you information about how to care for yourself after your procedure. Your doctor may also give you more specific instructions. If youhave problems or questions, contact your doctor. What can I expect after the procedure? After the procedure, it is common to have: Bruising and soreness at the IV site. A fever or chills on the day of the procedure. This may be your body's response to the new blood cells received. A headache. Follow these instructions at home: Insertion site care     Follow instructions from your doctor about how to take care of your insertion site. This is where an IV tube was put into your vein. Make sure you: Wash  your hands with soap and water before and after you change your bandage (dressing). If you cannot use soap and water, use hand sanitizer. Change your bandage as told by your doctor. Check your insertion site every day for signs of infection. Check for: Redness, swelling, or pain. Bleeding from the site. Warmth. Pus or a bad smell. General instructions Take over-the-counter and prescription medicines only as told by your doctor. Rest as told by your doctor. Go back to your normal activities as told by your doctor. Keep all follow-up visits as told by your doctor. This is important. Contact a doctor if: You have itching or red, swollen areas of skin (hives). You feel worried or nervous (anxious). You feel weak after doing your normal activities. You have redness, swelling, warmth, or pain around the insertion site. You have blood coming from the insertion site, and the blood does not stop with pressure. You have  pus or a bad smell coming from the insertion site. Get help right away if: You have signs of a serious reaction. This may be coming from an allergy or the body's defense system (immune system). Signs include: Trouble breathing or shortness of breath. Swelling of the face or feeling warm (flushed). Fever or chills. Head, chest, or back pain. Dark pee (urine) or blood in the pee. Widespread rash. Fast heartbeat. Feeling dizzy or light-headed. You may receive your blood transfusion in an outpatient setting. If so, youwill be told whom to contact to report any reactions. These symptoms may be an emergency. Do not wait to see if the symptoms will go away. Get medical help right away. Call your local emergency services (911 in the [place] Do not drive yourself to the hospital. Summary Bruising and soreness at the IV site are common. Check your insertion site every day for signs of infection. Rest as told by your doctor. Go back to your normal activities as told by your doctor. Get  help right away if you have signs of a serious reaction. This information is not intended to replace advice given to you by your health care provider. Make sure you discuss any questions you have with your healthcare provider. Document Revised: 11/22/2018 Document Reviewed: 11/22/2018 Elsevier Patient Education  [place].

## 2020-12-29 ENCOUNTER — Inpatient Hospital Stay: Payer: Medicare HMO

## 2020-12-29 ENCOUNTER — Other Ambulatory Visit: Payer: Medicare HMO

## 2020-12-29 VITALS — BP 121/59 | HR 100 | Temp 98.1°F | Resp 20 | Wt 165.5 lb

## 2020-12-29 DIAGNOSIS — I10 Essential (primary) hypertension: Secondary | ICD-10-CM | POA: Diagnosis not present

## 2020-12-29 DIAGNOSIS — Z5111 Encounter for antineoplastic chemotherapy: Secondary | ICD-10-CM | POA: Diagnosis not present

## 2020-12-29 DIAGNOSIS — Z7984 Long term (current) use of oral hypoglycemic drugs: Secondary | ICD-10-CM | POA: Diagnosis not present

## 2020-12-29 DIAGNOSIS — C9310 Chronic myelomonocytic leukemia not having achieved remission: Secondary | ICD-10-CM | POA: Diagnosis not present

## 2020-12-29 DIAGNOSIS — E785 Hyperlipidemia, unspecified: Secondary | ICD-10-CM | POA: Diagnosis not present

## 2020-12-29 DIAGNOSIS — E119 Type 2 diabetes mellitus without complications: Secondary | ICD-10-CM | POA: Diagnosis not present

## 2020-12-29 DIAGNOSIS — Z79899 Other long term (current) drug therapy: Secondary | ICD-10-CM | POA: Diagnosis not present

## 2020-12-29 DIAGNOSIS — Z9884 Bariatric surgery status: Secondary | ICD-10-CM | POA: Diagnosis not present

## 2020-12-29 DIAGNOSIS — D693 Immune thrombocytopenic purpura: Secondary | ICD-10-CM | POA: Diagnosis not present

## 2020-12-29 LAB — BPAM PLATELET PHERESIS
Blood Product Expiration Date: 202207182359
ISSUE DATE / TIME: 202207181237
Unit Type and Rh: 5100

## 2020-12-29 LAB — PREPARE PLATELET PHERESIS: Unit division: 0

## 2020-12-29 MED ORDER — HEPARIN SOD (PORK) LOCK FLUSH 100 UNIT/ML IV SOLN
250.0000 [IU] | Freq: Once | INTRAVENOUS | Status: AC | PRN
  Administered 2020-12-29: 250 [IU]
  Filled 2020-12-29: qty 5

## 2020-12-29 MED ORDER — SODIUM CHLORIDE 0.9% FLUSH
10.0000 mL | INTRAVENOUS | Status: DC | PRN
  Administered 2020-12-29: 10 mL
  Filled 2020-12-29: qty 10

## 2020-12-29 MED ORDER — SODIUM CHLORIDE 0.9 % IV SOLN
10.0000 mg | Freq: Once | INTRAVENOUS | Status: AC
  Administered 2020-12-29: 10 mg via INTRAVENOUS
  Filled 2020-12-29: qty 10

## 2020-12-29 MED ORDER — SODIUM CHLORIDE 0.9 % IV SOLN
75.0000 mg/m2 | Freq: Once | INTRAVENOUS | Status: AC
  Administered 2020-12-29: 135 mg via INTRAVENOUS
  Filled 2020-12-29: qty 13.5

## 2020-12-29 MED ORDER — SODIUM CHLORIDE 0.9 % IV SOLN
Freq: Once | INTRAVENOUS | Status: AC
  Filled 2020-12-29: qty 250

## 2020-12-29 NOTE — Patient Instructions (Signed)
[facility]  Discharge Instructions: Thank you for choosing [facility] to provide your oncology and hematology care.   If you have a lab appointment with the Cancer Center, please go directly to the Cancer Center and check in at the registration area.   Wear comfortable clothing and clothing appropriate for easy access to any Portacath or PICC line.   We strive to give you quality time with your provider. You may need to reschedule your appointment if you arrive late (15 or more minutes).  Arriving late affects you and other patients whose appointments are after yours.  Also, if you miss three or more appointments without notifying the office, you may be dismissed from the clinic at the provider's discretion.      For prescription refill requests, have your pharmacy contact our office and allow 72 hours for refills to be completed.    Today you received the following chemotherapy and/or immunotherapy agent: Azacitidine (Vidaza)      To help prevent nausea and vomiting after your treatment, we encourage you to take your nausea medication as directed.  BELOW ARE SYMPTOMS THAT SHOULD BE REPORTED IMMEDIATELY: . *FEVER GREATER THAN 100.4 F (38 C) OR HIGHER . *CHILLS OR SWEATING . *NAUSEA AND VOMITING THAT IS NOT CONTROLLED WITH YOUR NAUSEA MEDICATION . *UNUSUAL SHORTNESS OF BREATH . *UNUSUAL BRUISING OR BLEEDING . *URINARY PROBLEMS (pain or burning when urinating, or frequent urination) . *BOWEL PROBLEMS (unusual diarrhea, constipation, pain near the anus) . TENDERNESS IN MOUTH AND THROAT WITH OR WITHOUT PRESENCE OF ULCERS (sore throat, sores in mouth, or a toothache) . UNUSUAL RASH, SWELLING OR PAIN  . UNUSUAL VAGINAL DISCHARGE OR ITCHING   Items with * indicate a potential emergency and should be followed up as soon as possible or go to the Emergency Department if any problems should occur.  Please show the CHEMOTHERAPY ALERT CARD or  IMMUNOTHERAPY ALERT CARD at check-in to the Emergency Department and triage nurse.  Should you have questions after your visit or need to cancel or reschedule your appointment, please contact [facility]  Dept: [phone]  and follow the prompts.  Office hours are 8:00 a.m. to 4:30 p.m. Monday - Friday. Please note that voicemails left after 4:00 p.m. may not be returned until the following business day.  We are closed weekends and major holidays. You have access to a nurse at all times for urgent questions. Please call the main number to the clinic Dept: [phone] and follow the prompts.   For any non-urgent questions, you may also contact your provider using MyChart. We now offer e-Visits for anyone 18 and older to request care online for non-urgent symptoms. For details visit [email or web address].   Also download the MyChart app! Go to the app store, search "MyChart", open the app, select [facility], and log in with your MyChart username and password.  Due to Covid, a mask is required upon entering the hospital/clinic. If you do not have a mask, one will be given to you upon arrival. For doctor visits, patients may have 1 support person aged 18 or older with them. For treatment visits, patients cannot have anyone with them due to current Covid guidelines and our immunocompromised population.   

## 2020-12-30 ENCOUNTER — Inpatient Hospital Stay: Payer: Medicare HMO

## 2020-12-30 ENCOUNTER — Other Ambulatory Visit: Payer: Self-pay

## 2020-12-30 VITALS — BP 138/58 | HR 113 | Temp 97.9°F | Resp 20

## 2020-12-30 DIAGNOSIS — Z7984 Long term (current) use of oral hypoglycemic drugs: Secondary | ICD-10-CM | POA: Diagnosis not present

## 2020-12-30 DIAGNOSIS — Z79899 Other long term (current) drug therapy: Secondary | ICD-10-CM | POA: Diagnosis not present

## 2020-12-30 DIAGNOSIS — C9310 Chronic myelomonocytic leukemia not having achieved remission: Secondary | ICD-10-CM | POA: Diagnosis not present

## 2020-12-30 DIAGNOSIS — E785 Hyperlipidemia, unspecified: Secondary | ICD-10-CM | POA: Diagnosis not present

## 2020-12-30 DIAGNOSIS — Z5111 Encounter for antineoplastic chemotherapy: Secondary | ICD-10-CM | POA: Diagnosis not present

## 2020-12-30 DIAGNOSIS — D693 Immune thrombocytopenic purpura: Secondary | ICD-10-CM | POA: Diagnosis not present

## 2020-12-30 DIAGNOSIS — E119 Type 2 diabetes mellitus without complications: Secondary | ICD-10-CM | POA: Diagnosis not present

## 2020-12-30 DIAGNOSIS — Z9884 Bariatric surgery status: Secondary | ICD-10-CM | POA: Diagnosis not present

## 2020-12-30 DIAGNOSIS — I10 Essential (primary) hypertension: Secondary | ICD-10-CM | POA: Diagnosis not present

## 2020-12-30 MED ORDER — HEPARIN SOD (PORK) LOCK FLUSH 100 UNIT/ML IV SOLN
250.0000 [IU] | Freq: Once | INTRAVENOUS | Status: AC | PRN
  Administered 2020-12-30: 250 [IU]
  Filled 2020-12-30: qty 5

## 2020-12-30 MED ORDER — SODIUM CHLORIDE 0.9 % IV SOLN
75.0000 mg/m2 | Freq: Once | INTRAVENOUS | Status: AC
  Administered 2020-12-30: 135 mg via INTRAVENOUS
  Filled 2020-12-30: qty 13.5

## 2020-12-30 MED ORDER — SODIUM CHLORIDE 0.9% FLUSH
3.0000 mL | INTRAVENOUS | Status: DC | PRN
  Administered 2020-12-30: 3 mL
  Filled 2020-12-30: qty 10

## 2020-12-30 MED ORDER — SODIUM CHLORIDE 0.9 % IV SOLN
Freq: Once | INTRAVENOUS | Status: AC
Start: 2020-12-30 — End: 2020-12-30
  Filled 2020-12-30: qty 250

## 2020-12-30 MED ORDER — SODIUM CHLORIDE 0.9 % IV SOLN
10.0000 mg | Freq: Once | INTRAVENOUS | Status: AC
  Administered 2020-12-30: 10 mg via INTRAVENOUS
  Filled 2020-12-30: qty 10

## 2020-12-30 MED ORDER — PALONOSETRON HCL INJECTION 0.25 MG/5ML
INTRAVENOUS | Status: AC
  Filled 2020-12-30: qty 5

## 2020-12-30 MED ORDER — PALONOSETRON HCL INJECTION 0.25 MG/5ML
0.2500 mg | Freq: Once | INTRAVENOUS | Status: AC
  Administered 2020-12-30: 0.25 mg via INTRAVENOUS

## 2020-12-30 NOTE — Progress Notes (Signed)
Okay to treat with elevated pulse per Dr. Burr Medico  Patient c/o feeling her heart flutter and felling washed out stating 1 hour ago. No associated SOB or CP. Obtained EKG per verbal order from Dr. Burr Medico. Dr. Burr Medico assessed patient and verbalized okay to treat.

## 2020-12-30 NOTE — Patient Instructions (Signed)
[facility]  Discharge Instructions: Thank you for choosing [facility] to provide your oncology and hematology care.   If you have a lab appointment with the Cancer Center, please go directly to the Cancer Center and check in at the registration area.   Wear comfortable clothing and clothing appropriate for easy access to any Portacath or PICC line.   We strive to give you quality time with your provider. You may need to reschedule your appointment if you arrive late (15 or more minutes).  Arriving late affects you and other patients whose appointments are after yours.  Also, if you miss three or more appointments without notifying the office, you may be dismissed from the clinic at the provider's discretion.      For prescription refill requests, have your pharmacy contact our office and allow 72 hours for refills to be completed.    Today you received the following chemotherapy and/or immunotherapy agent: Azacitidine (Vidaza)      To help prevent nausea and vomiting after your treatment, we encourage you to take your nausea medication as directed.  BELOW ARE SYMPTOMS THAT SHOULD BE REPORTED IMMEDIATELY: . *FEVER GREATER THAN 100.4 F (38 C) OR HIGHER . *CHILLS OR SWEATING . *NAUSEA AND VOMITING THAT IS NOT CONTROLLED WITH YOUR NAUSEA MEDICATION . *UNUSUAL SHORTNESS OF BREATH . *UNUSUAL BRUISING OR BLEEDING . *URINARY PROBLEMS (pain or burning when urinating, or frequent urination) . *BOWEL PROBLEMS (unusual diarrhea, constipation, pain near the anus) . TENDERNESS IN MOUTH AND THROAT WITH OR WITHOUT PRESENCE OF ULCERS (sore throat, sores in mouth, or a toothache) . UNUSUAL RASH, SWELLING OR PAIN  . UNUSUAL VAGINAL DISCHARGE OR ITCHING   Items with * indicate a potential emergency and should be followed up as soon as possible or go to the Emergency Department if any problems should occur.  Please show the CHEMOTHERAPY ALERT CARD or  IMMUNOTHERAPY ALERT CARD at check-in to the Emergency Department and triage nurse.  Should you have questions after your visit or need to cancel or reschedule your appointment, please contact [facility]  Dept: [phone]  and follow the prompts.  Office hours are 8:00 a.m. to 4:30 p.m. Monday - Friday. Please note that voicemails left after 4:00 p.m. may not be returned until the following business day.  We are closed weekends and major holidays. You have access to a nurse at all times for urgent questions. Please call the main number to the clinic Dept: [phone] and follow the prompts.   For any non-urgent questions, you may also contact your provider using MyChart. We now offer e-Visits for anyone 18 and older to request care online for non-urgent symptoms. For details visit [email or web address].   Also download the MyChart app! Go to the app store, search "MyChart", open the app, select [facility], and log in with your MyChart username and password.  Due to Covid, a mask is required upon entering the hospital/clinic. If you do not have a mask, one will be given to you upon arrival. For doctor visits, patients may have 1 support person aged 18 or older with them. For treatment visits, patients cannot have anyone with them due to current Covid guidelines and our immunocompromised population.   

## 2020-12-31 ENCOUNTER — Other Ambulatory Visit: Payer: Medicare HMO

## 2020-12-31 ENCOUNTER — Inpatient Hospital Stay: Payer: Medicare HMO

## 2020-12-31 ENCOUNTER — Ambulatory Visit: Payer: Medicare HMO

## 2020-12-31 ENCOUNTER — Other Ambulatory Visit: Payer: Self-pay

## 2020-12-31 VITALS — BP 108/48 | HR 84 | Temp 98.1°F | Resp 18

## 2020-12-31 DIAGNOSIS — C9310 Chronic myelomonocytic leukemia not having achieved remission: Secondary | ICD-10-CM | POA: Diagnosis not present

## 2020-12-31 DIAGNOSIS — D693 Immune thrombocytopenic purpura: Secondary | ICD-10-CM

## 2020-12-31 DIAGNOSIS — Z95828 Presence of other vascular implants and grafts: Secondary | ICD-10-CM

## 2020-12-31 DIAGNOSIS — E119 Type 2 diabetes mellitus without complications: Secondary | ICD-10-CM | POA: Diagnosis not present

## 2020-12-31 DIAGNOSIS — Z5111 Encounter for antineoplastic chemotherapy: Secondary | ICD-10-CM | POA: Diagnosis not present

## 2020-12-31 DIAGNOSIS — E785 Hyperlipidemia, unspecified: Secondary | ICD-10-CM | POA: Diagnosis not present

## 2020-12-31 DIAGNOSIS — Z9884 Bariatric surgery status: Secondary | ICD-10-CM | POA: Diagnosis not present

## 2020-12-31 DIAGNOSIS — I10 Essential (primary) hypertension: Secondary | ICD-10-CM | POA: Diagnosis not present

## 2020-12-31 DIAGNOSIS — D696 Thrombocytopenia, unspecified: Secondary | ICD-10-CM

## 2020-12-31 DIAGNOSIS — Z79899 Other long term (current) drug therapy: Secondary | ICD-10-CM | POA: Diagnosis not present

## 2020-12-31 DIAGNOSIS — Z7984 Long term (current) use of oral hypoglycemic drugs: Secondary | ICD-10-CM | POA: Diagnosis not present

## 2020-12-31 LAB — CMP (CANCER CENTER ONLY)
ALT: 19 U/L (ref 0–44)
AST: 24 U/L (ref 15–41)
Albumin: 3.4 g/dL — ABNORMAL LOW (ref 3.5–5.0)
Alkaline Phosphatase: 72 U/L (ref 38–126)
Anion gap: 6 (ref 5–15)
BUN: 22 mg/dL (ref 8–23)
CO2: 24 mmol/L (ref 22–32)
Calcium: 8.9 mg/dL (ref 8.9–10.3)
Chloride: 106 mmol/L (ref 98–111)
Creatinine: 0.79 mg/dL (ref 0.44–1.00)
GFR, Estimated: >60 mL/min (ref >60)
Glucose, Bld: 139 mg/dL — ABNORMAL HIGH (ref 70–99)
Potassium: 4.2 mmol/L (ref 3.5–5.1)
Sodium: 136 mmol/L (ref 135–145)
Total Bilirubin: 0.8 mg/dL (ref 0.3–1.2)
Total Protein: 7.9 g/dL (ref 6.5–8.1)

## 2020-12-31 LAB — CBC WITH DIFFERENTIAL (CANCER CENTER ONLY)
Abs Immature Granulocytes: 2.55 10*3/uL — ABNORMAL HIGH (ref 0.00–0.07)
Basophils Absolute: 0 10*3/uL (ref 0.0–0.1)
Basophils Relative: 0 %
Eosinophils Absolute: 0 10*3/uL (ref 0.0–0.5)
Eosinophils Relative: 0 %
HCT: 27.3 % — ABNORMAL LOW (ref 36.0–46.0)
Hemoglobin: 8.6 g/dL — ABNORMAL LOW (ref 12.0–15.0)
Immature Granulocytes: 15 %
Lymphocytes Relative: 5 %
Lymphs Abs: 0.9 10*3/uL (ref 0.7–4.0)
MCH: 28.7 pg (ref 26.0–34.0)
MCHC: 31.5 g/dL (ref 30.0–36.0)
MCV: 91 fL (ref 80.0–100.0)
Monocytes Absolute: 3.5 10*3/uL — ABNORMAL HIGH (ref 0.1–1.0)
Monocytes Relative: 21 %
Neutro Abs: 9.8 10*3/uL — ABNORMAL HIGH (ref 1.7–7.7)
Neutrophils Relative %: 59 %
Platelet Count: 5 10*3/uL — CL (ref 150–400)
RBC: 3 MIL/uL — ABNORMAL LOW (ref 3.87–5.11)
RDW: 18.8 % — ABNORMAL HIGH (ref 11.5–15.5)
WBC Count: 16.8 10*3/uL — ABNORMAL HIGH (ref 4.0–10.5)
nRBC: 0.2 % (ref 0.0–0.2)

## 2020-12-31 MED ORDER — SODIUM CHLORIDE 0.9 % IV SOLN
75.0000 mg/m2 | Freq: Once | INTRAVENOUS | Status: AC
  Administered 2020-12-31: 135 mg via INTRAVENOUS
  Filled 2020-12-31: qty 13.5

## 2020-12-31 MED ORDER — SODIUM CHLORIDE 0.9% FLUSH
10.0000 mL | INTRAVENOUS | Status: DC | PRN
  Administered 2020-12-31: 10 mL
  Filled 2020-12-31: qty 10

## 2020-12-31 MED ORDER — HEPARIN SOD (PORK) LOCK FLUSH 100 UNIT/ML IV SOLN
250.0000 [IU] | Freq: Once | INTRAVENOUS | Status: AC | PRN
  Administered 2020-12-31: 250 [IU]
  Filled 2020-12-31: qty 5

## 2020-12-31 MED ORDER — SODIUM CHLORIDE 0.9 % IV SOLN
10.0000 mg | Freq: Once | INTRAVENOUS | Status: AC
  Administered 2020-12-31: 10 mg via INTRAVENOUS
  Filled 2020-12-31: qty 10

## 2020-12-31 MED ORDER — HEPARIN SOD (PORK) LOCK FLUSH 100 UNIT/ML IV SOLN
500.0000 [IU] | Freq: Every day | INTRAVENOUS | Status: DC | PRN
  Filled 2020-12-31: qty 5

## 2020-12-31 MED ORDER — SODIUM CHLORIDE 0.9% FLUSH
10.0000 mL | INTRAVENOUS | Status: AC | PRN
  Administered 2020-12-31: 10 mL
  Filled 2020-12-31: qty 10

## 2020-12-31 MED ORDER — SODIUM CHLORIDE 0.9% FLUSH
10.0000 mL | INTRAVENOUS | Status: DC | PRN
  Filled 2020-12-31: qty 10

## 2020-12-31 MED ORDER — SODIUM CHLORIDE 0.9 % IV SOLN
Freq: Once | INTRAVENOUS | Status: AC
  Filled 2020-12-31: qty 250

## 2020-12-31 MED ORDER — SODIUM CHLORIDE 0.9% IV SOLUTION
250.0000 mL | Freq: Once | INTRAVENOUS | Status: AC
  Administered 2020-12-31: 250 mL via INTRAVENOUS
  Filled 2020-12-31: qty 250

## 2020-12-31 NOTE — Patient Instructions (Signed)
[facility]  Discharge Instructions: Thank you for choosing [facility] to provide your oncology and hematology care.   If you have a lab appointment with the Cancer Center, please go directly to the Cancer Center and check in at the registration area.   Wear comfortable clothing and clothing appropriate for easy access to any Portacath or PICC line.   We strive to give you quality time with your provider. You may need to reschedule your appointment if you arrive late (15 or more minutes).  Arriving late affects you and other patients whose appointments are after yours.  Also, if you miss three or more appointments without notifying the office, you may be dismissed from the clinic at the provider's discretion.      For prescription refill requests, have your pharmacy contact our office and allow 72 hours for refills to be completed.   Today you received the following chemotherapy and/or immunotherapy agents Vidaza     To help prevent nausea and vomiting after your treatment, we encourage you to take your nausea medication as directed.  BELOW ARE SYMPTOMS THAT SHOULD BE REPORTED IMMEDIATELY: *FEVER GREATER THAN 100.4 F (38 C) OR HIGHER *CHILLS OR SWEATING *NAUSEA AND VOMITING THAT IS NOT CONTROLLED WITH YOUR NAUSEA MEDICATION *UNUSUAL SHORTNESS OF BREATH *UNUSUAL BRUISING OR BLEEDING *URINARY PROBLEMS (pain or burning when urinating, or frequent urination) *BOWEL PROBLEMS (unusual diarrhea, constipation, pain near the anus) TENDERNESS IN MOUTH AND THROAT WITH OR WITHOUT PRESENCE OF ULCERS (sore throat, sores in mouth, or a toothache) UNUSUAL RASH, SWELLING OR PAIN  UNUSUAL VAGINAL DISCHARGE OR ITCHING   Items with * indicate a potential emergency and should be followed up as soon as possible or go to the Emergency Department if any problems should occur.  Please show the CHEMOTHERAPY ALERT CARD or IMMUNOTHERAPY ALERT CARD at check-in to the  Emergency Department and triage nurse.  Should you have questions after your visit or need to cancel or reschedule your appointment, please contact [facility]  Dept: [phone]  and follow the prompts.  Office hours are 8:00 a.m. to 4:30 p.m. Monday - Friday. Please note that voicemails left after 4:00 p.m. may not be returned until the following business day.  We are closed weekends and major holidays. You have access to a nurse at all times for urgent questions. Please call the main number to the clinic Dept: [phone] and follow the prompts.   For any non-urgent questions, you may also contact your provider using MyChart. We now offer e-Visits for anyone 18 and older to request care online for non-urgent symptoms. For details visit [email or web address].   Also download the MyChart app! Go to the app store, search "MyChart", open the app, select [facility], and log in with your MyChart username and password.  Due to Covid, a mask is required upon entering the hospital/clinic. If you do not have a mask, one will be given to you upon arrival. For doctor visits, patients may have 1 support person aged 18 or older with them. For treatment visits, patients cannot have anyone with them due to current Covid guidelines and our immunocompromised population.   

## 2020-12-31 NOTE — Progress Notes (Signed)
Received critical value plts 5, Joy Puryear, LPN notified at [phone].  Pt scheduled for plt transfusion today.

## 2020-12-31 NOTE — Progress Notes (Signed)
CRITICAL VALUE STICKER  CRITICAL VALUE: PLATELETS  5  RECEIVER (on-site recipient of call): Melanie  DATE & TIME NOTIFIED: 12/31/20 9:57am  MESSENGER (representative from lab): Melanie   MD NOTIFIED:  Dr. Burr Medico  TIME OF NOTIFICATION:10:00am  RESPONSE:  Patient is receiving platelet infusion today

## 2021-01-01 ENCOUNTER — Other Ambulatory Visit: Payer: Self-pay

## 2021-01-01 ENCOUNTER — Inpatient Hospital Stay: Payer: Medicare HMO

## 2021-01-01 VITALS — BP 136/68 | HR 89 | Temp 98.5°F | Resp 17

## 2021-01-01 DIAGNOSIS — Z9884 Bariatric surgery status: Secondary | ICD-10-CM | POA: Diagnosis not present

## 2021-01-01 DIAGNOSIS — C9310 Chronic myelomonocytic leukemia not having achieved remission: Secondary | ICD-10-CM

## 2021-01-01 DIAGNOSIS — Z5111 Encounter for antineoplastic chemotherapy: Secondary | ICD-10-CM | POA: Diagnosis not present

## 2021-01-01 DIAGNOSIS — I10 Essential (primary) hypertension: Secondary | ICD-10-CM | POA: Diagnosis not present

## 2021-01-01 DIAGNOSIS — Z79899 Other long term (current) drug therapy: Secondary | ICD-10-CM | POA: Diagnosis not present

## 2021-01-01 DIAGNOSIS — E785 Hyperlipidemia, unspecified: Secondary | ICD-10-CM | POA: Diagnosis not present

## 2021-01-01 DIAGNOSIS — Z7984 Long term (current) use of oral hypoglycemic drugs: Secondary | ICD-10-CM | POA: Diagnosis not present

## 2021-01-01 DIAGNOSIS — D693 Immune thrombocytopenic purpura: Secondary | ICD-10-CM | POA: Diagnosis not present

## 2021-01-01 DIAGNOSIS — E119 Type 2 diabetes mellitus without complications: Secondary | ICD-10-CM | POA: Diagnosis not present

## 2021-01-01 LAB — PREPARE PLATELET PHERESIS: Unit division: 0

## 2021-01-01 LAB — BPAM PLATELET PHERESIS
Blood Product Expiration Date: 202207232359
ISSUE DATE / TIME: 202207211026
Unit Type and Rh: 5100

## 2021-01-01 MED ORDER — PALONOSETRON HCL INJECTION 0.25 MG/5ML
0.2500 mg | Freq: Once | INTRAVENOUS | Status: AC
  Administered 2021-01-01: 0.25 mg via INTRAVENOUS

## 2021-01-01 MED ORDER — SODIUM CHLORIDE 0.9 % IV SOLN
10.0000 mg | Freq: Once | INTRAVENOUS | Status: AC
  Administered 2021-01-01: 10 mg via INTRAVENOUS
  Filled 2021-01-01: qty 10

## 2021-01-01 MED ORDER — SODIUM CHLORIDE 0.9% FLUSH
3.0000 mL | INTRAVENOUS | Status: DC | PRN
  Administered 2021-01-01: 3 mL
  Filled 2021-01-01: qty 10

## 2021-01-01 MED ORDER — SODIUM CHLORIDE 0.9 % IV SOLN
Freq: Once | INTRAVENOUS | Status: AC
  Filled 2021-01-01: qty 250

## 2021-01-01 MED ORDER — HEPARIN SOD (PORK) LOCK FLUSH 100 UNIT/ML IV SOLN
250.0000 [IU] | Freq: Once | INTRAVENOUS | Status: AC | PRN
  Administered 2021-01-01: 250 [IU]
  Filled 2021-01-01: qty 5

## 2021-01-01 MED ORDER — SODIUM CHLORIDE 0.9 % IV SOLN
75.0000 mg/m2 | Freq: Once | INTRAVENOUS | Status: AC
  Administered 2021-01-01: 135 mg via INTRAVENOUS
  Filled 2021-01-01: qty 13.5

## 2021-01-01 MED ORDER — PALONOSETRON HCL INJECTION 0.25 MG/5ML
INTRAVENOUS | Status: AC
  Filled 2021-01-01: qty 5

## 2021-01-04 ENCOUNTER — Ambulatory Visit (HOSPITAL_COMMUNITY)
Admission: RE | Admit: 2021-01-04 | Discharge: 2021-01-04 | Disposition: A | Discharge status: Home / Self Care | Payer: Medicare HMO | Source: Ambulatory Visit | Attending: Hematology | Admitting: Hematology

## 2021-01-04 ENCOUNTER — Other Ambulatory Visit: Payer: Medicare HMO

## 2021-01-04 ENCOUNTER — Other Ambulatory Visit: Payer: Self-pay | Admitting: Hematology

## 2021-01-04 ENCOUNTER — Telehealth: Payer: Self-pay

## 2021-01-04 ENCOUNTER — Other Ambulatory Visit: Payer: Self-pay

## 2021-01-04 ENCOUNTER — Inpatient Hospital Stay: Payer: Medicare HMO

## 2021-01-04 VITALS — BP 119/57 | HR 73 | Temp 98.4°F | Resp 18 | Ht 63.0 in | Wt 162.0 lb

## 2021-01-04 DIAGNOSIS — D696 Thrombocytopenia, unspecified: Secondary | ICD-10-CM

## 2021-01-04 DIAGNOSIS — Z79899 Other long term (current) drug therapy: Secondary | ICD-10-CM | POA: Diagnosis not present

## 2021-01-04 DIAGNOSIS — C9310 Chronic myelomonocytic leukemia not having achieved remission: Secondary | ICD-10-CM

## 2021-01-04 DIAGNOSIS — D693 Immune thrombocytopenic purpura: Secondary | ICD-10-CM | POA: Diagnosis not present

## 2021-01-04 DIAGNOSIS — D469 Myelodysplastic syndrome, unspecified: Secondary | ICD-10-CM

## 2021-01-04 DIAGNOSIS — I10 Essential (primary) hypertension: Secondary | ICD-10-CM | POA: Diagnosis not present

## 2021-01-04 DIAGNOSIS — E119 Type 2 diabetes mellitus without complications: Secondary | ICD-10-CM | POA: Diagnosis not present

## 2021-01-04 DIAGNOSIS — C946 Myelodysplastic disease, not classified: Secondary | ICD-10-CM

## 2021-01-04 DIAGNOSIS — Z5111 Encounter for antineoplastic chemotherapy: Secondary | ICD-10-CM | POA: Diagnosis not present

## 2021-01-04 DIAGNOSIS — E785 Hyperlipidemia, unspecified: Secondary | ICD-10-CM | POA: Diagnosis not present

## 2021-01-04 DIAGNOSIS — Z9884 Bariatric surgery status: Secondary | ICD-10-CM | POA: Diagnosis not present

## 2021-01-04 DIAGNOSIS — J9811 Atelectasis: Secondary | ICD-10-CM | POA: Diagnosis not present

## 2021-01-04 DIAGNOSIS — Z7984 Long term (current) use of oral hypoglycemic drugs: Secondary | ICD-10-CM | POA: Diagnosis not present

## 2021-01-04 LAB — CBC WITH DIFFERENTIAL (CANCER CENTER ONLY)
Abs Immature Granulocytes: 2.74 10*3/uL — ABNORMAL HIGH (ref 0.00–0.07)
Basophils Absolute: 0.1 10*3/uL (ref 0.0–0.1)
Basophils Relative: 1 %
Eosinophils Absolute: 0 10*3/uL (ref 0.0–0.5)
Eosinophils Relative: 0 %
HCT: 26.7 % — ABNORMAL LOW (ref 36.0–46.0)
Hemoglobin: 8.3 g/dL — ABNORMAL LOW (ref 12.0–15.0)
Immature Granulocytes: 20 %
Lymphocytes Relative: 7 %
Lymphs Abs: 0.9 10*3/uL (ref 0.7–4.0)
MCH: 28 pg (ref 26.0–34.0)
MCHC: 31.1 g/dL (ref 30.0–36.0)
MCV: 90.2 fL (ref 80.0–100.0)
Monocytes Absolute: 2.2 10*3/uL — ABNORMAL HIGH (ref 0.1–1.0)
Monocytes Relative: 16 %
Neutro Abs: 7.8 10*3/uL — ABNORMAL HIGH (ref 1.7–7.7)
Neutrophils Relative %: 56 %
Platelet Count: 9 10*3/uL — CL (ref 150–400)
RBC: 2.96 MIL/uL — ABNORMAL LOW (ref 3.87–5.11)
RDW: 19 % — ABNORMAL HIGH (ref 11.5–15.5)
WBC Count: 13.8 10*3/uL — ABNORMAL HIGH (ref 4.0–10.5)
nRBC: 0.2 % (ref 0.0–0.2)

## 2021-01-04 LAB — CMP (CANCER CENTER ONLY)
ALT: 13 U/L (ref 0–44)
AST: 15 U/L (ref 15–41)
Albumin: 3.2 g/dL — ABNORMAL LOW (ref 3.5–5.0)
Alkaline Phosphatase: 70 U/L (ref 38–126)
Anion gap: 5 (ref 5–15)
BUN: 16 mg/dL (ref 8–23)
CO2: 26 mmol/L (ref 22–32)
Calcium: 8.6 mg/dL — ABNORMAL LOW (ref 8.9–10.3)
Chloride: 104 mmol/L (ref 98–111)
Creatinine: 0.86 mg/dL (ref 0.44–1.00)
GFR, Estimated: >60 mL/min (ref >60)
Glucose, Bld: 278 mg/dL — ABNORMAL HIGH (ref 70–99)
Potassium: 4.3 mmol/L (ref 3.5–5.1)
Sodium: 135 mmol/L (ref 135–145)
Total Bilirubin: 0.6 mg/dL (ref 0.3–1.2)
Total Protein: 7.8 g/dL (ref 6.5–8.1)

## 2021-01-04 MED ORDER — HEPARIN SOD (PORK) LOCK FLUSH 100 UNIT/ML IV SOLN
500.0000 [IU] | Freq: Once | INTRAVENOUS | Status: AC
  Administered 2021-01-04: 250 [IU] via INTRAVENOUS
  Filled 2021-01-04: qty 5

## 2021-01-04 MED ORDER — ROMIPLOSTIM INJECTION 500 MCG
10.0000 ug/kg | Freq: Once | SUBCUTANEOUS | Status: AC
Start: 2021-01-04 — End: 2021-01-04
  Administered 2021-01-04: 750 ug via SUBCUTANEOUS
  Filled 2021-01-04: qty 1

## 2021-01-04 MED ORDER — SODIUM CHLORIDE 0.9% FLUSH
3.0000 mL | INTRAVENOUS | Status: DC | PRN
  Filled 2021-01-04: qty 10

## 2021-01-04 MED ORDER — SODIUM CHLORIDE 0.9% IV SOLUTION
250.0000 mL | Freq: Once | INTRAVENOUS | Status: AC
  Administered 2021-01-04: 250 mL via INTRAVENOUS
  Filled 2021-01-04: qty 250

## 2021-01-04 MED ORDER — SODIUM CHLORIDE 0.9% FLUSH
10.0000 mL | Freq: Once | INTRAVENOUS | Status: DC
  Filled 2021-01-04: qty 10

## 2021-01-04 NOTE — Telephone Encounter (Signed)
CRITICAL VALUE STICKER  CRITICAL VALUE: Platelets = 9  RECEIVER (on-site recipient of call): Yetta Glassman, [place] NOTIFIED: 01/04/21 at 2:45pm  MESSENGER (representative from lab): Ulice Dash  MD NOTIFIED: Burr Medico  TIME OF NOTIFICATION: 01/04/21 at 2:50pm  RESPONSE: Notification given to [place], [place]

## 2021-01-04 NOTE — Progress Notes (Signed)
VAST RN called and spoke with physician in IR to determine next steps for patient's PICC once x-ray obtained to determine PICC tip location. MD verbalized patient's PICC should remain in place and line should be heparinized. If x-ray shows line to be centrally located, and no signs/symptoms of infection, it can continue to be used. If line is no longer central, MD stated it should remain in place and physician should place order for IR PICC exchange on 01/05/21. Contacted Dr. Burr Medico via [facility] and informed of conversation. She verbalized she will follow-up xray results in am and contact patient as needed.

## 2021-01-04 NOTE — Telephone Encounter (Signed)
CRITICAL VALUE STICKER  CRITICAL VALUE: Platelet 9  RECEIVER (on-site recipient of call): Deklan Minar P. LPN  DATE & TIME NOTIFIED: 01/04/21 245p  MESSENGER (representative from lab): Ulice Dash  MD NOTIFIED: Dr. Burr Medico

## 2021-01-04 NOTE — Progress Notes (Signed)
VAST consulted to place PIV for platelet transfusion and assess PICC as the line has come out at least 8 cms per RN report. RN reported that patient has been scratching and rubbing PICC site d/t itching.  PIV placed in patient's left arm utilizing [place] as charted in flowsheets. Removed gauze and transparent dressing using adhesive remover. Securement device noted to be in place, however, PICC line out 13 cms between securement device and insertion site. Site cleaned per protocol, allowed to dry, new Biopatch and securement device placed, and new dressing placed.

## 2021-01-05 ENCOUNTER — Other Ambulatory Visit: Payer: Self-pay | Admitting: Hematology

## 2021-01-05 DIAGNOSIS — C9310 Chronic myelomonocytic leukemia not having achieved remission: Secondary | ICD-10-CM

## 2021-01-05 LAB — BPAM PLATELET PHERESIS
Blood Product Expiration Date: 202207262359
ISSUE DATE / TIME: 202207251603
Unit Type and Rh: 6200

## 2021-01-05 LAB — PREPARE PLATELET PHERESIS: Unit division: 0

## 2021-01-07 ENCOUNTER — Other Ambulatory Visit: Payer: Self-pay

## 2021-01-07 ENCOUNTER — Other Ambulatory Visit: Payer: Medicare HMO

## 2021-01-07 ENCOUNTER — Ambulatory Visit (HOSPITAL_COMMUNITY)
Admission: RE | Admit: 2021-01-07 | Discharge: 2021-01-07 | Disposition: A | Discharge status: Home / Self Care | Payer: Medicare HMO | Source: Ambulatory Visit | Attending: Hematology | Admitting: Hematology

## 2021-01-07 ENCOUNTER — Encounter: Payer: Self-pay | Admitting: Nurse Practitioner

## 2021-01-07 ENCOUNTER — Inpatient Hospital Stay (HOSPITAL_BASED_OUTPATIENT_CLINIC_OR_DEPARTMENT_OTHER): Payer: Medicare HMO | Admitting: Nurse Practitioner

## 2021-01-07 ENCOUNTER — Telehealth: Payer: Self-pay

## 2021-01-07 ENCOUNTER — Inpatient Hospital Stay: Payer: Medicare HMO

## 2021-01-07 ENCOUNTER — Ambulatory Visit: Payer: Medicare HMO

## 2021-01-07 VITALS — BP 126/66 | HR 87 | Temp 98.3°F | Resp 18 | Ht 63.0 in | Wt 161.2 lb

## 2021-01-07 DIAGNOSIS — Z452 Encounter for adjustment and management of vascular access device: Secondary | ICD-10-CM | POA: Diagnosis not present

## 2021-01-07 DIAGNOSIS — Z79899 Other long term (current) drug therapy: Secondary | ICD-10-CM | POA: Diagnosis not present

## 2021-01-07 DIAGNOSIS — Z5111 Encounter for antineoplastic chemotherapy: Secondary | ICD-10-CM | POA: Diagnosis not present

## 2021-01-07 DIAGNOSIS — D696 Thrombocytopenia, unspecified: Secondary | ICD-10-CM

## 2021-01-07 DIAGNOSIS — D693 Immune thrombocytopenic purpura: Secondary | ICD-10-CM | POA: Diagnosis not present

## 2021-01-07 DIAGNOSIS — C9310 Chronic myelomonocytic leukemia not having achieved remission: Secondary | ICD-10-CM | POA: Diagnosis not present

## 2021-01-07 DIAGNOSIS — E785 Hyperlipidemia, unspecified: Secondary | ICD-10-CM | POA: Diagnosis not present

## 2021-01-07 DIAGNOSIS — Z9884 Bariatric surgery status: Secondary | ICD-10-CM | POA: Diagnosis not present

## 2021-01-07 DIAGNOSIS — E119 Type 2 diabetes mellitus without complications: Secondary | ICD-10-CM | POA: Diagnosis not present

## 2021-01-07 DIAGNOSIS — Z7984 Long term (current) use of oral hypoglycemic drugs: Secondary | ICD-10-CM | POA: Diagnosis not present

## 2021-01-07 DIAGNOSIS — I10 Essential (primary) hypertension: Secondary | ICD-10-CM | POA: Diagnosis not present

## 2021-01-07 DIAGNOSIS — T82528A Displacement of other cardiac and vascular devices and implants, initial encounter: Secondary | ICD-10-CM | POA: Diagnosis not present

## 2021-01-07 LAB — CBC WITH DIFFERENTIAL (CANCER CENTER ONLY)
Abs Immature Granulocytes: 2.45 10*3/uL — ABNORMAL HIGH (ref 0.00–0.07)
Basophils Absolute: 0.1 10*3/uL (ref 0.0–0.1)
Basophils Relative: 1 %
Eosinophils Absolute: 0.3 10*3/uL (ref 0.0–0.5)
Eosinophils Relative: 2 %
HCT: 27.7 % — ABNORMAL LOW (ref 36.0–46.0)
Hemoglobin: 8.8 g/dL — ABNORMAL LOW (ref 12.0–15.0)
Immature Granulocytes: 16 %
Lymphocytes Relative: 8 %
Lymphs Abs: 1.3 10*3/uL (ref 0.7–4.0)
MCH: 28.4 pg (ref 26.0–34.0)
MCHC: 31.8 g/dL (ref 30.0–36.0)
MCV: 89.4 fL (ref 80.0–100.0)
Monocytes Absolute: 2.1 10*3/uL — ABNORMAL HIGH (ref 0.1–1.0)
Monocytes Relative: 14 %
Neutro Abs: 9.3 10*3/uL — ABNORMAL HIGH (ref 1.7–7.7)
Neutrophils Relative %: 59 %
Platelet Count: 6 10*3/uL — CL (ref 150–400)
RBC: 3.1 MIL/uL — ABNORMAL LOW (ref 3.87–5.11)
RDW: 19.3 % — ABNORMAL HIGH (ref 11.5–15.5)
WBC Count: 15.6 10*3/uL — ABNORMAL HIGH (ref 4.0–10.5)
nRBC: 0.1 % (ref 0.0–0.2)

## 2021-01-07 MED ORDER — LIDOCAINE HCL 1 % IJ SOLN
INTRAMUSCULAR | Status: AC
  Filled 2021-01-07: qty 20

## 2021-01-07 MED ORDER — SODIUM CHLORIDE 0.9% FLUSH
3.0000 mL | INTRAVENOUS | Status: AC | PRN
  Administered 2021-01-07: 3 mL
  Filled 2021-01-07: qty 10

## 2021-01-07 MED ORDER — SODIUM CHLORIDE 0.9% FLUSH
10.0000 mL | INTRAVENOUS | Status: DC | PRN
  Administered 2021-01-07: 10 mL via INTRAVENOUS
  Filled 2021-01-07: qty 10

## 2021-01-07 MED ORDER — HEPARIN SOD (PORK) LOCK FLUSH 100 UNIT/ML IV SOLN
250.0000 [IU] | INTRAVENOUS | Status: AC | PRN
  Administered 2021-01-07: 250 [IU]
  Filled 2021-01-07: qty 5

## 2021-01-07 MED ORDER — SODIUM CHLORIDE 0.9% IV SOLUTION
250.0000 mL | Freq: Once | INTRAVENOUS | Status: AC
  Administered 2021-01-07: 250 mL via INTRAVENOUS
  Filled 2021-01-07: qty 250

## 2021-01-07 NOTE — Progress Notes (Signed)
1003am - recevied critical Plt 6.  Reported to Kerney Elbe, LPN with readback.

## 2021-01-07 NOTE — Telephone Encounter (Signed)
CRITICAL VALUE STICKER  CRITICAL VALUE: Platelet 6  RECEIVER (on-site recipient of call): Marvelous Woolford P.   DATE & TIME NOTIFIED: 01/07/21 10:05am  MESSENGER (representative from lab): Darylene Price   MD NOTIFIED: Dr. Burr Medico  TIME OF NOTIFICATION: 10:10 am

## 2021-01-07 NOTE — Progress Notes (Signed)
[place]   Telephone:(336) [phone] Fax:(336) [phone]   Clinic Follow up Note   Patient Care Team: [name], DO as PCP - General (Family Medicine) Loletha Carrow, Kirke Corin, MD as Consulting Physician (Gastroenterology) Truitt Merle, MD as Consulting Physician (Hematology) Alda Berthold, DO as Consulting Physician (Neurology) 01/07/2021  CHIEF COMPLAINT: F/up MDS/MPN, [name], and severe thrombocytopenia  SUMMARY OF ONCOLOGIC HISTORY: Oncology History  [name] (chronic myelomonocytic leukemia) ([place])  12/17/2020 Initial Diagnosis   [name] (chronic myelomonocytic leukemia) ([place])    12/28/2020 -  Chemotherapy    Patient is on Treatment Plan: MYELODYSPLASIA  AZACITIDINE IV D1-5 Q28D         CURRENT THERAPY:  Azacitidine days 1-5 q28 days, s/p cycle 1 12/28/20 - 01/01/21 Nplate Platelet transfusion as needed  INTERVAL HISTORY: Kathleen Gibson returns for follow up as scheduled, here for platelets.  She "always" has bruising but denies bleeding.  PICC line placed this morning without difficulty.  She feels well otherwise, tolerated chemo last week.  She gets tired in the afternoon and usually has a headache, resolves with Tylenol.  She is eating and drinking and remains functional at home.  Denies fever, chills, cough, chest pain, dyspnea, or other new concerns.    MEDICAL HISTORY:  Past Medical History:  Diagnosis Date   Diabetes ([place])    HLD (hyperlipidemia)    Hypertension    Pernicious anemia    Pneumonia 09/05/2019   Renal insufficiency 09/06/2019   Vitamin D deficiency     SURGICAL HISTORY: Past Surgical History:  Procedure Laterality Date   ABDOMINAL HERNIA REPAIR  2009   ABDOMINOPLASTY     AXILLARY LYMPH NODE BIOPSY Left 11/11/2020   Procedure: LEFT AXILLARY EXCISIONAL LYMPH NODE BIOPSY;  Surgeon: [name], MD;  Location: [facility];  Service: General;  Laterality: Left;   [place]   hemorroid surgery  2007   [place]    I have reviewed the social history and family history with the patient and they are unchanged from previous note.  ALLERGIES:  is allergic to asa [aspirin] and nsaids.  MEDICATIONS:  Current Outpatient Medications  Medication Sig Dispense Refill   ferrous sulfate 325 (65 FE) MG tablet Take 2 tablets (650 mg total) by mouth daily with breakfast. 180 tablet 3   gabapentin (NEURONTIN) 300 MG capsule Take 2 capsules (600 mg total) by mouth 2 (two) times daily. 360 capsule 1   glucose monitoring kit (FREESTYLE) monitoring kit 1 each by Does not apply route as needed for other. Use as directed 1 each 0   metoprolol succinate (TOPROL-XL) 50 MG 24 hr tablet Take 1 tablet (50 mg total) by mouth daily. 90 tablet 1   sitaGLIPtin-metformin (JANUMET) 50-1000 MG tablet Take 1 tablet by mouth at bedtime. 90 tablet 1   Vitamin D, Cholecalciferol, 25 MCG (1000 UT) CAPS Take 1,000 Units by mouth daily.      zolpidem (AMBIEN) 5 MG tablet Take 1 tablet (5 mg total) by mouth at bedtime. 90 tablet 1   No current facility-administered medications for this visit.   Facility-Administered Medications Ordered in Other Visits  Medication Dose Route Frequency Provider Last Rate Last Admin   lidocaine (XYLOCAINE) 1 % (with pres) injection             PHYSICAL EXAMINATION: ECOG PERFORMANCE STATUS: 1 - Symptomatic but completely ambulatory  Vitals:   01/07/21 0957  BP:  126/66  Pulse: 87  Resp: 18  Temp: 98.3 F (36.8 C)  SpO2: 100%   Filed Weights   01/07/21 0957  Weight: 161 lb 3.2 oz (73.1 kg)    GENERAL:alert, no distress and comfortable SKIN: Scattered ecchymoses to the bilateral forearms EYES: sclera clear LUNGS:  normal breathing effort HEART: no lower extremity edema NEURO: alert & oriented x 3 with fluent speech Right upper arm PICC with mild bruising, no bleeding or erythema  LABORATORY DATA:  I have reviewed the data as listed CBC Latest Ref Rng & Units  01/07/2021 01/04/2021 12/31/2020  WBC 4.0 - 10.5 K/uL 15.6(H) 13.8(H) 16.8(H)  Hemoglobin 12.0 - 15.0 g/dL 8.8(L) 8.3(L) 8.6(L)  Hematocrit 36.0 - 46.0 % 27.7(L) 26.7(L) 27.3(L)  Platelets 150 - 400 K/uL 6(LL) 9(LL) 5(LL)     CMP Latest Ref Rng & Units 01/04/2021 12/31/2020 12/28/2020  Glucose 70 - 99 mg/dL 278(H) 139(H) 146(H)  BUN 8 - 23 mg/dL $Remove'16 22 14  'LKyerpS$ Creatinine 0.44 - 1.00 mg/dL 0.86 0.79 0.79  Sodium 135 - 145 mmol/L 135 136 138  Potassium 3.5 - 5.1 mmol/L 4.3 4.2 4.1  Chloride 98 - 111 mmol/L 104 106 107  CO2 22 - 32 mmol/L $RemoveB'26 24 25  'rMYEVPFh$ Calcium 8.9 - 10.3 mg/dL 8.6(L) 8.9 8.3(L)  Total Protein 6.5 - 8.1 g/dL 7.8 7.9 8.4(H)  Total Bilirubin 0.3 - 1.2 mg/dL 0.6 0.8 0.7  Alkaline Phos 38 - 126 U/L 70 72 80  AST 15 - 41 U/L $Remo'15 24 16  'pDCNl$ ALT 0 - 44 U/L $Remo'13 19 11      'MWJjz$ RADIOGRAPHIC STUDIES: I have personally reviewed the radiological images as listed and agreed with the findings in the report. IR PICC REPLACEMENT RIGHT INC IMG GUIDE  Result Date: 01/07/2021 INDICATION: Retracted right upper extremity PICC. PICC required for chemotherapy administration. EXAM: Fluoroscopy guided PICC exchange MEDICATIONS: None ANESTHESIA/SEDATION: None FLUOROSCOPY TIME:  Fluoroscopy Time: 0 minutes 12 seconds (2 mGy). COMPLICATIONS: None immediate. PROCEDURE: Informed written consent was obtained from the patient after a thorough discussion of the procedural risks, benefits and alternatives. All questions were addressed. Maximal Sterile Barrier Technique was utilized including caps, mask, sterile gowns, sterile gloves, sterile drape, hand hygiene and skin antiseptic. A timeout was performed prior to the initiation of the procedure. Scout image demonstrated the existing right upper extremity PICC terminating in the region of the axillary vein. The PICC was cut and removed over a 0.018 inch guidewire. New PICC was cut to the same length, 35 cm, and inserted over the guidewire. The tip was positioned at the cavoatrial  junction. Both lumens aspirated and flushed well. PICC secured to skin with StatLock and insert site covered with bio patch and sterile dressing. IMPRESSION: Successful exchange of right upper extremity PICC. New PICC ready for use. Electronically Signed   By: Miachel Roux M.D.   On: 01/07/2021 09:16     ASSESSMENT & PLAN: Kathleen Gibson is a 70 y.o. female with   1. [name] with severe thrombocytopenia  -Initially diagnosed with ITP in 2014. She lost follow up after 11/2017 and did not proceed with recommended bone marrow biopsy. -She was referred to ED 10/28/20 by her PCP for low plt count of 4k. She was hospitalized and treated with platelet transfusions, IVIG, oral prednisone $RemoveBeforeD'60mg'UAThFgRvfxgKjc$  and Nplate injections. She tapered off prednisone given little response. -Bone marrow biopsy 10/30/20 felt to likely represent MDS/MPN, particularly [name].  Confirmed on slide review at [facility] by Dr. Linus Orn -  She began treatment for severe refractory thrombocytopenia with weekly Rituxan x4 on 11/19/20, she did not respond  -She began azacitidine daily days 1-5 q. 28 days, s/p cycle 1 she tolerated well with mild fatigue and stable headache -She had worsening thrombocytopenia after stopping Nplate, restarted weekly Nplate 10 mcg/kg on 5/68/6168   2. Normocytic anemia, moderate  -H/o pernicious anemia/vitamin B12 deficiency -Transfuse for Hgb </= 7.5 -Continue oral iron    3. Type 2 diabetes mellitus with polyneuropathy, Hypertension -Continue medication, follow-up PCP   4. Headache -Etiology unknown, managed with Tylenol -Stable -We will get imaging if headaches progress   Disposition: Ms. Rona Ravens appears stable.  She completed cycle 1 azacitidine days 1-5, tolerated well with mild fatigue and stable headaches.  Side effects are well managed with supportive care at home.  She is able to recover and function well.  Labs reviewed, platelets 6K, with bruising but no overt bleeding.  We will transfuse 1 unit of  platelets as scheduled. Continue weekly Nplate and plt transfusions Tuesday/Thursday as needed. We reviewed bleeding precautions, pt knows to present to ED if she has severe bleeding over the weekend.  F/up 8/8 with Dr. Burr Medico.   All questions were answered. The patient knows to call the clinic with any problems, questions or concerns. No barriers to learning were detected.     Alla Feeling, NP 01/07/21

## 2021-01-07 NOTE — Patient Instructions (Signed)
Platelet Transfusion A platelet transfusion is a procedure in which you receive donated platelets through an IV. Platelets are tiny pieces of blood cells. When you get an injury, platelets clump together in the area to form a blood clot. This helps stop bleeding and is the beginning of the healing process. If you have too few platelets, your blood may have trouble clotting. This may cause you to bleedand bruise very easily. You may need a platelet transfusion if you have a condition that causes a low number of platelets (thrombocytopenia). A platelet transfusion may be used to stop or prevent excessive bleeding. Tell a health care provider about: Any reactions you have had during previous transfusions. Any allergies you have. All medicines you are taking, including vitamins, herbs, eye drops, creams, and over-the-counter medicines. Any blood disorders you have. Any surgeries you have had. Any medical conditions you have. Whether you are pregnant or may be pregnant. What are the risks? Generally, this is a safe procedure. However, problems may occur, including: Fever. Infection. Allergic reaction to the donor platelets. Your body's disease-fighting system (immune system) attacking the donor platelets (hemolytic reaction). This is rare. A rare reaction that causes lung damage (transfusion-related acute lung injury). What happens before the procedure? Medicines Ask your health care provider about: Changing or stopping your regular medicines. This is especially important if you are taking diabetes medicines or blood thinners. Taking medicines such as aspirin and ibuprofen. These medicines can thin your blood. Do not take these medicines unless your health care provider tells you to take them. Taking over-the-counter medicines, vitamins, herbs, and supplements. General instructions You will have a blood test to determine your blood type. Your blood type determines what kind of platelets you will  be given. Follow instructions from your health care provider about eating or drinking restrictions. If you have had an allergic reaction to a transfusion in the past, you may be given medicine to help prevent a reaction. Your temperature, blood pressure, pulse, and breathing will be monitored. What happens during the procedure?  An IV will be inserted into one of your veins. For your safety, two health care providers will verify your identity along with the donor platelets about to be infused. A bag of donor platelets will be connected to your IV. The platelets will flow into your bloodstream. This usually takes 30-60 minutes. Your temperature, blood pressure, pulse, and breathing will be monitored during the transfusion. This helps detect early signs of any reaction. You will also be monitored for other symptoms that may indicate a reaction, including chills, hives, or itching. If you have signs of a reaction at any time, your transfusion will be stopped, and you may be given medicine to help manage the reaction. When your transfusion is complete, your IV will be removed. Pressure may be applied to the IV site for a few minutes to stop any bleeding. The IV site will be covered with a bandage (dressing). The procedure may vary among health care providers and hospitals. What happens after the procedure? Your blood pressure, temperature, pulse, and breathing will be monitored until you leave the hospital or clinic. You may have some bruising and soreness at your IV site. Follow these instructions at home: Medicines Take over-the-counter and prescription medicines only as told by your health care provider. Talk with your health care provider before you take any medicines that contain aspirin or NSAIDs. These medicines increase your risk for dangerous bleeding. General instructions Change or remove your dressing as told  by your health care provider. Return to your normal activities as told by  your health care provider. Ask your health care provider what activities are safe for you. Do not take baths, swim, or use a hot tub until your health care provider approves. Ask your health care provider if you may take showers. Check your IV site every day for signs of infection. Check for: Redness, swelling, or pain. Fluid or blood. If fluid or blood drains from your IV site, use your hands to press down firmly on a bandage covering the area for a minute or two. Doing this should stop the bleeding. Warmth. Pus or a bad smell. Keep all follow-up visits as told by your health care provider. This is important. Contact a health care provider if you have: A headache that does not go away with medicine. Hives, rash, or itchy skin. Nausea or vomiting. Unusual tiredness or weakness. Signs of infection at your IV site. Get help right away if: You have a fever or chills. You urinate less often than usual. Your urine is darker colored than normal. You have any of the following: Trouble breathing. Pain in your back, abdomen, or chest. Cool, clammy skin. A fast heartbeat. Summary Platelets are tiny pieces of blood cells that clump together to form a blood clot when you have an injury. If you have too few platelets, your blood may have trouble clotting. A platelet transfusion is a procedure in which you receive donated platelets through an IV. A platelet transfusion may be used to stop or prevent excessive bleeding. After the procedure, check your IV site every day for signs of infection, including redness, swelling, pain, or warmth. This information is not intended to replace advice given to you by your health care provider. Make sure you discuss any questions you have with your healthcare provider. Document Revised: 07/05/2017 Document Reviewed: 07/05/2017 Elsevier Patient Education  2022 [employer].

## 2021-01-07 NOTE — Procedures (Signed)
Interventional Radiology Procedure Note  Procedure: PICC exchange  Indication: Retracted PICC  Findings: Please refer to procedural dictation for full description.  Complications: None  EBL: < 10 mL  Miachel Roux, MD [phone]

## 2021-01-07 NOTE — Progress Notes (Signed)
1003am - received critical Platelet count - 6; called to Kerney Elbe, LPN at [phone], verified by readback.

## 2021-01-11 ENCOUNTER — Inpatient Hospital Stay: Payer: Medicare HMO

## 2021-01-11 ENCOUNTER — Other Ambulatory Visit: Payer: Self-pay

## 2021-01-11 ENCOUNTER — Other Ambulatory Visit: Payer: Medicare HMO

## 2021-01-11 ENCOUNTER — Inpatient Hospital Stay: Payer: Medicare HMO | Attending: Nurse Practitioner

## 2021-01-11 VITALS — BP 110/60 | HR 81 | Temp 98.3°F | Resp 18

## 2021-01-11 DIAGNOSIS — R1012 Left upper quadrant pain: Secondary | ICD-10-CM | POA: Diagnosis not present

## 2021-01-11 DIAGNOSIS — E1142 Type 2 diabetes mellitus with diabetic polyneuropathy: Secondary | ICD-10-CM | POA: Diagnosis not present

## 2021-01-11 DIAGNOSIS — D510 Vitamin B12 deficiency anemia due to intrinsic factor deficiency: Secondary | ICD-10-CM | POA: Insufficient documentation

## 2021-01-11 DIAGNOSIS — D693 Immune thrombocytopenic purpura: Secondary | ICD-10-CM | POA: Insufficient documentation

## 2021-01-11 DIAGNOSIS — Z95828 Presence of other vascular implants and grafts: Secondary | ICD-10-CM

## 2021-01-11 DIAGNOSIS — D696 Thrombocytopenia, unspecified: Secondary | ICD-10-CM

## 2021-01-11 DIAGNOSIS — C9310 Chronic myelomonocytic leukemia not having achieved remission: Secondary | ICD-10-CM | POA: Insufficient documentation

## 2021-01-11 DIAGNOSIS — R519 Headache, unspecified: Secondary | ICD-10-CM | POA: Diagnosis not present

## 2021-01-11 DIAGNOSIS — Z5111 Encounter for antineoplastic chemotherapy: Secondary | ICD-10-CM | POA: Insufficient documentation

## 2021-01-11 LAB — CBC WITH DIFFERENTIAL (CANCER CENTER ONLY)
Abs Immature Granulocytes: 1.57 10*3/uL — ABNORMAL HIGH (ref 0.00–0.07)
Basophils Absolute: 0.1 10*3/uL (ref 0.0–0.1)
Basophils Relative: 0 %
Eosinophils Absolute: 0 10*3/uL (ref 0.0–0.5)
Eosinophils Relative: 0 %
HCT: 28.3 % — ABNORMAL LOW (ref 36.0–46.0)
Hemoglobin: 8.8 g/dL — ABNORMAL LOW (ref 12.0–15.0)
Immature Granulocytes: 11 %
Lymphocytes Relative: 8 %
Lymphs Abs: 1.1 10*3/uL (ref 0.7–4.0)
MCH: 28.2 pg (ref 26.0–34.0)
MCHC: 31.1 g/dL (ref 30.0–36.0)
MCV: 90.7 fL (ref 80.0–100.0)
Monocytes Absolute: 2.7 10*3/uL — ABNORMAL HIGH (ref 0.1–1.0)
Monocytes Relative: 19 %
Neutro Abs: 8.9 10*3/uL — ABNORMAL HIGH (ref 1.7–7.7)
Neutrophils Relative %: 62 %
Platelet Count: 20 10*3/uL — ABNORMAL LOW (ref 150–400)
RBC: 3.12 MIL/uL — ABNORMAL LOW (ref 3.87–5.11)
RDW: 19.1 % — ABNORMAL HIGH (ref 11.5–15.5)
WBC Count: 14.3 10*3/uL — ABNORMAL HIGH (ref 4.0–10.5)
nRBC: 0 % (ref 0.0–0.2)

## 2021-01-11 LAB — CMP (CANCER CENTER ONLY)
ALT: 16 U/L (ref 0–44)
AST: 16 U/L (ref 15–41)
Albumin: 3.4 g/dL — ABNORMAL LOW (ref 3.5–5.0)
Alkaline Phosphatase: 89 U/L (ref 38–126)
Anion gap: 6 (ref 5–15)
BUN: 18 mg/dL (ref 8–23)
CO2: 26 mmol/L (ref 22–32)
Calcium: 8.8 mg/dL — ABNORMAL LOW (ref 8.9–10.3)
Chloride: 105 mmol/L (ref 98–111)
Creatinine: 0.87 mg/dL (ref 0.44–1.00)
GFR, Estimated: >60 mL/min (ref >60)
Glucose, Bld: 305 mg/dL — ABNORMAL HIGH (ref 70–99)
Potassium: 4.4 mmol/L (ref 3.5–5.1)
Sodium: 137 mmol/L (ref 135–145)
Total Bilirubin: 0.7 mg/dL (ref 0.3–1.2)
Total Protein: 7.8 g/dL (ref 6.5–8.1)

## 2021-01-11 MED ORDER — SODIUM CHLORIDE 0.9% IV SOLUTION
250.0000 mL | Freq: Once | INTRAVENOUS | Status: AC
  Administered 2021-01-11: 250 mL via INTRAVENOUS
  Filled 2021-01-11: qty 250

## 2021-01-11 MED ORDER — SODIUM CHLORIDE 0.9% FLUSH
3.0000 mL | INTRAVENOUS | Status: AC | PRN
  Administered 2021-01-11: 3 mL
  Filled 2021-01-11: qty 10

## 2021-01-11 MED ORDER — HEPARIN SOD (PORK) LOCK FLUSH 100 UNIT/ML IV SOLN
250.0000 [IU] | INTRAVENOUS | Status: AC | PRN
  Administered 2021-01-11: 250 [IU]
  Filled 2021-01-11: qty 5

## 2021-01-11 MED ORDER — SODIUM CHLORIDE 0.9% FLUSH
10.0000 mL | INTRAVENOUS | Status: AC | PRN
  Administered 2021-01-11: 10 mL
  Filled 2021-01-11: qty 10

## 2021-01-11 MED ORDER — ROMIPLOSTIM INJECTION 500 MCG
750.0000 ug | Freq: Once | SUBCUTANEOUS | Status: AC
  Administered 2021-01-11: 750 ug via SUBCUTANEOUS
  Filled 2021-01-11: qty 1

## 2021-01-12 LAB — BPAM PLATELET PHERESIS
Blood Product Expiration Date: 202207302359
Blood Product Expiration Date: 202208022359
ISSUE DATE / TIME: 202207281114
ISSUE DATE / TIME: 202208011459
Unit Type and Rh: 5100
Unit Type and Rh: 8400

## 2021-01-12 LAB — PREPARE PLATELET PHERESIS
Unit division: 0
Unit division: 0

## 2021-01-14 ENCOUNTER — Inpatient Hospital Stay: Payer: Medicare HMO

## 2021-01-14 ENCOUNTER — Other Ambulatory Visit: Payer: Medicare HMO

## 2021-01-14 ENCOUNTER — Other Ambulatory Visit: Payer: Self-pay

## 2021-01-14 VITALS — BP 141/63 | HR 84 | Temp 98.7°F | Resp 18

## 2021-01-14 DIAGNOSIS — Z5111 Encounter for antineoplastic chemotherapy: Secondary | ICD-10-CM | POA: Diagnosis not present

## 2021-01-14 DIAGNOSIS — R1012 Left upper quadrant pain: Secondary | ICD-10-CM | POA: Diagnosis not present

## 2021-01-14 DIAGNOSIS — D693 Immune thrombocytopenic purpura: Secondary | ICD-10-CM

## 2021-01-14 DIAGNOSIS — D696 Thrombocytopenia, unspecified: Secondary | ICD-10-CM

## 2021-01-14 DIAGNOSIS — C9310 Chronic myelomonocytic leukemia not having achieved remission: Secondary | ICD-10-CM | POA: Diagnosis not present

## 2021-01-14 DIAGNOSIS — E1142 Type 2 diabetes mellitus with diabetic polyneuropathy: Secondary | ICD-10-CM | POA: Diagnosis not present

## 2021-01-14 DIAGNOSIS — D510 Vitamin B12 deficiency anemia due to intrinsic factor deficiency: Secondary | ICD-10-CM | POA: Diagnosis not present

## 2021-01-14 DIAGNOSIS — Z452 Encounter for adjustment and management of vascular access device: Secondary | ICD-10-CM

## 2021-01-14 DIAGNOSIS — Z95828 Presence of other vascular implants and grafts: Secondary | ICD-10-CM

## 2021-01-14 DIAGNOSIS — R519 Headache, unspecified: Secondary | ICD-10-CM | POA: Diagnosis not present

## 2021-01-14 LAB — CBC WITH DIFFERENTIAL (CANCER CENTER ONLY)
Abs Immature Granulocytes: 0.89 10*3/uL — ABNORMAL HIGH (ref 0.00–0.07)
Basophils Absolute: 0 10*3/uL (ref 0.0–0.1)
Basophils Relative: 0 %
Eosinophils Absolute: 0 10*3/uL (ref 0.0–0.5)
Eosinophils Relative: 0 %
HCT: 26.8 % — ABNORMAL LOW (ref 36.0–46.0)
Hemoglobin: 8.6 g/dL — ABNORMAL LOW (ref 12.0–15.0)
Immature Granulocytes: 8 %
Lymphocytes Relative: 8 %
Lymphs Abs: 1 10*3/uL (ref 0.7–4.0)
MCH: 29.2 pg (ref 26.0–34.0)
MCHC: 32.1 g/dL (ref 30.0–36.0)
MCV: 90.8 fL (ref 80.0–100.0)
Monocytes Absolute: 2.9 10*3/uL — ABNORMAL HIGH (ref 0.1–1.0)
Monocytes Relative: 24 %
Neutro Abs: 7 10*3/uL (ref 1.7–7.7)
Neutrophils Relative %: 60 %
Platelet Count: 26 10*3/uL — ABNORMAL LOW (ref 150–400)
RBC: 2.95 MIL/uL — ABNORMAL LOW (ref 3.87–5.11)
RDW: 19.2 % — ABNORMAL HIGH (ref 11.5–15.5)
WBC Count: 11.8 10*3/uL — ABNORMAL HIGH (ref 4.0–10.5)
nRBC: 0 % (ref 0.0–0.2)

## 2021-01-14 MED ORDER — SODIUM CHLORIDE 0.9% FLUSH
3.0000 mL | INTRAVENOUS | Status: DC | PRN
  Administered 2021-01-14: 3 mL via INTRAVENOUS
  Filled 2021-01-14: qty 10

## 2021-01-14 MED ORDER — SODIUM CHLORIDE 0.9% IV SOLUTION
250.0000 mL | Freq: Once | INTRAVENOUS | Status: DC
  Filled 2021-01-14: qty 250

## 2021-01-14 MED ORDER — HEPARIN SOD (PORK) LOCK FLUSH 100 UNIT/ML IV SOLN
250.0000 [IU] | Freq: Once | INTRAVENOUS | Status: AC
  Administered 2021-01-14: 250 [IU] via INTRAVENOUS
  Filled 2021-01-14: qty 5

## 2021-01-14 MED ORDER — SODIUM CHLORIDE 0.9% FLUSH
10.0000 mL | INTRAVENOUS | Status: AC | PRN
  Administered 2021-01-14: 10 mL
  Filled 2021-01-14: qty 10

## 2021-01-14 NOTE — Progress Notes (Signed)
Per Dr. Burr Medico - patient does not require platelet transfusion today. Patient already received weekly dose of nplate (8/1).  PICC line saline and heparin locked and patient discharged in stable condition.

## 2021-01-18 ENCOUNTER — Inpatient Hospital Stay: Payer: Medicare HMO

## 2021-01-18 ENCOUNTER — Other Ambulatory Visit: Payer: Self-pay

## 2021-01-18 ENCOUNTER — Inpatient Hospital Stay (HOSPITAL_BASED_OUTPATIENT_CLINIC_OR_DEPARTMENT_OTHER): Payer: Medicare HMO | Admitting: Hematology

## 2021-01-18 ENCOUNTER — Other Ambulatory Visit: Payer: Medicare HMO

## 2021-01-18 VITALS — BP 113/63 | HR 80 | Temp 98.7°F | Resp 18 | Wt 165.4 lb

## 2021-01-18 DIAGNOSIS — E1142 Type 2 diabetes mellitus with diabetic polyneuropathy: Secondary | ICD-10-CM | POA: Diagnosis not present

## 2021-01-18 DIAGNOSIS — D693 Immune thrombocytopenic purpura: Secondary | ICD-10-CM

## 2021-01-18 DIAGNOSIS — D696 Thrombocytopenia, unspecified: Secondary | ICD-10-CM

## 2021-01-18 DIAGNOSIS — R1012 Left upper quadrant pain: Secondary | ICD-10-CM | POA: Diagnosis not present

## 2021-01-18 DIAGNOSIS — D510 Vitamin B12 deficiency anemia due to intrinsic factor deficiency: Secondary | ICD-10-CM | POA: Diagnosis not present

## 2021-01-18 DIAGNOSIS — Z5111 Encounter for antineoplastic chemotherapy: Secondary | ICD-10-CM | POA: Diagnosis not present

## 2021-01-18 DIAGNOSIS — R519 Headache, unspecified: Secondary | ICD-10-CM | POA: Diagnosis not present

## 2021-01-18 DIAGNOSIS — Z95828 Presence of other vascular implants and grafts: Secondary | ICD-10-CM

## 2021-01-18 DIAGNOSIS — C9310 Chronic myelomonocytic leukemia not having achieved remission: Secondary | ICD-10-CM | POA: Diagnosis not present

## 2021-01-18 LAB — CMP (CANCER CENTER ONLY)
ALT: 14 U/L (ref 0–44)
AST: 14 U/L — ABNORMAL LOW (ref 15–41)
Albumin: 3.3 g/dL — ABNORMAL LOW (ref 3.5–5.0)
Alkaline Phosphatase: 97 U/L (ref 38–126)
Anion gap: 7 (ref 5–15)
BUN: 18 mg/dL (ref 8–23)
CO2: 25 mmol/L (ref 22–32)
Calcium: 8.8 mg/dL — ABNORMAL LOW (ref 8.9–10.3)
Chloride: 106 mmol/L (ref 98–111)
Creatinine: 0.91 mg/dL (ref 0.44–1.00)
GFR, Estimated: >60 mL/min (ref >60)
Glucose, Bld: 208 mg/dL — ABNORMAL HIGH (ref 70–99)
Potassium: 4.6 mmol/L (ref 3.5–5.1)
Sodium: 138 mmol/L (ref 135–145)
Total Bilirubin: 0.6 mg/dL (ref 0.3–1.2)
Total Protein: 7.7 g/dL (ref 6.5–8.1)

## 2021-01-18 LAB — CBC WITH DIFFERENTIAL (CANCER CENTER ONLY)
Abs Immature Granulocytes: 0.5 10*3/uL — ABNORMAL HIGH (ref 0.00–0.07)
Basophils Absolute: 0.1 10*3/uL (ref 0.0–0.1)
Basophils Relative: 1 %
Eosinophils Absolute: 0 10*3/uL (ref 0.0–0.5)
Eosinophils Relative: 0 %
HCT: 27.7 % — ABNORMAL LOW (ref 36.0–46.0)
Hemoglobin: 8.9 g/dL — ABNORMAL LOW (ref 12.0–15.0)
Immature Granulocytes: 5 %
Lymphocytes Relative: 9 %
Lymphs Abs: 1 10*3/uL (ref 0.7–4.0)
MCH: 29.1 pg (ref 26.0–34.0)
MCHC: 32.1 g/dL (ref 30.0–36.0)
MCV: 90.5 fL (ref 80.0–100.0)
Monocytes Absolute: 2.7 10*3/uL — ABNORMAL HIGH (ref 0.1–1.0)
Monocytes Relative: 24 %
Neutro Abs: 6.7 10*3/uL (ref 1.7–7.7)
Neutrophils Relative %: 61 %
Platelet Count: 30 10*3/uL — ABNORMAL LOW (ref 150–400)
RBC: 3.06 MIL/uL — ABNORMAL LOW (ref 3.87–5.11)
RDW: 18.7 % — ABNORMAL HIGH (ref 11.5–15.5)
WBC Count: 10.9 10*3/uL — ABNORMAL HIGH (ref 4.0–10.5)
nRBC: 0 % (ref 0.0–0.2)

## 2021-01-18 MED ORDER — ROMIPLOSTIM INJECTION 500 MCG
10.0000 ug/kg | Freq: Once | SUBCUTANEOUS | Status: AC
  Administered 2021-01-18: 750 ug via SUBCUTANEOUS
  Filled 2021-01-18: qty 1

## 2021-01-18 MED ORDER — SODIUM CHLORIDE 0.9% FLUSH
10.0000 mL | INTRAVENOUS | Status: AC | PRN
  Administered 2021-01-18: 10 mL
  Filled 2021-01-18: qty 10

## 2021-01-18 NOTE — Progress Notes (Signed)
[place]   Telephone:(336) [phone] Fax:(336) [phone]   Clinic Follow up Note   Patient Care Team: [name], DO as PCP - General (Family Medicine) Loletha Carrow, Kirke Corin, MD as Consulting Physician (Gastroenterology) Truitt Merle, MD as Consulting Physician (Hematology) Alda Berthold, DO as Consulting Physician (Neurology)  Date of Service:  01/18/2021  CHIEF COMPLAINT: f/u of [name], severe thrombocytopenia  SUMMARY OF ONCOLOGIC HISTORY: Oncology History  [name] (chronic myelomonocytic leukemia) ([place])  12/17/2020 Initial Diagnosis   [name] (chronic myelomonocytic leukemia) ([place])    12/28/2020 -  Chemotherapy    Patient is on Treatment Plan: MYELODYSPLASIA  AZACITIDINE IV D1-5 Q28D          CURRENT THERAPY:  Azacitidine days 1-5 q28 days, starting 12/28/20 Nplate Platelet transfusion as needed  INTERVAL HISTORY:  Kathleen Gibson is here for a follow up of [name]. She was last seen by me on 12/28/20 and by NP Lacie in the interim. She presents to the clinic alone. She reports feeling well overall, except for a new back pain. She notes it hurts when she bends or stands up. She believes it is a muscle pain.   All other systems were reviewed with the patient and are negative.  MEDICAL HISTORY:  Past Medical History:  Diagnosis Date   Diabetes ([place])    HLD (hyperlipidemia)    Hypertension    Pernicious anemia    Pneumonia 09/05/2019   Renal insufficiency 09/06/2019   Vitamin D deficiency     SURGICAL HISTORY: Past Surgical History:  Procedure Laterality Date   ABDOMINAL HERNIA REPAIR  2009   ABDOMINOPLASTY     AXILLARY LYMPH NODE BIOPSY Left 11/11/2020   Procedure: LEFT AXILLARY EXCISIONAL LYMPH NODE BIOPSY;  Surgeon: [name], MD;  Location: [facility];  Service: General;  Laterality: Left;   [place]   hemorroid surgery  2007   [place]    I have reviewed the social history and family  history with the patient and they are unchanged from previous note.  ALLERGIES:  is allergic to asa [aspirin] and nsaids.  MEDICATIONS:  Current Outpatient Medications  Medication Sig Dispense Refill   ferrous sulfate 325 (65 FE) MG tablet Take 2 tablets (650 mg total) by mouth daily with breakfast. 180 tablet 3   gabapentin (NEURONTIN) 300 MG capsule Take 2 capsules (600 mg total) by mouth 2 (two) times daily. 360 capsule 1   glucose monitoring kit (FREESTYLE) monitoring kit 1 each by Does not apply route as needed for other. Use as directed 1 each 0   metoprolol succinate (TOPROL-XL) 50 MG 24 hr tablet Take 1 tablet (50 mg total) by mouth daily. 90 tablet 1   sitaGLIPtin-metformin (JANUMET) 50-1000 MG tablet Take 1 tablet by mouth at bedtime. 90 tablet 1   Vitamin D, Cholecalciferol, 25 MCG (1000 UT) CAPS Take 1,000 Units by mouth daily.      zolpidem (AMBIEN) 5 MG tablet Take 1 tablet (5 mg total) by mouth at bedtime. 90 tablet 1   No current facility-administered medications for this visit.    PHYSICAL EXAMINATION: ECOG PERFORMANCE STATUS: 2 - Symptomatic, <50% confined to bed  There were no vitals filed for this visit. Wt Readings from Last 3 Encounters:  01/07/21 161 lb 3.2 oz (73.1 kg)  01/04/21 162 lb (73.5 kg)  12/29/20 165 lb 8 oz (75.1 kg)     GENERAL:alert, no distress  and comfortable SKIN: skin color normal, no rashes or significant lesions EYES: normal, Conjunctiva are pink and non-injected, sclera clear  NEURO: alert & oriented x 3 with fluent speech  LABORATORY DATA:  I have reviewed the data as listed CBC Latest Ref Rng & Units 01/18/2021 01/14/2021 01/11/2021  WBC 4.0 - 10.5 K/uL 10.9(H) 11.8(H) 14.3(H)  Hemoglobin 12.0 - 15.0 g/dL 8.9(L) 8.6(L) 8.8(L)  Hematocrit 36.0 - 46.0 % 27.7(L) 26.8(L) 28.3(L)  Platelets 150 - 400 K/uL 30(L) 26(L) 20(L)     CMP Latest Ref Rng & Units 01/18/2021 01/11/2021 01/04/2021  Glucose 70 - 99 mg/dL 208(H) 305(H) 278(H)  BUN 8 - 23  mg/dL $Remo'18 18 16  'Mqocq$ Creatinine 0.44 - 1.00 mg/dL 0.91 0.87 0.86  Sodium 135 - 145 mmol/L 138 137 135  Potassium 3.5 - 5.1 mmol/L 4.6 4.4 4.3  Chloride 98 - 111 mmol/L 106 105 104  CO2 22 - 32 mmol/L $RemoveB'25 26 26  'frneTJwN$ Calcium 8.9 - 10.3 mg/dL 8.8(L) 8.8(L) 8.6(L)  Total Protein 6.5 - 8.1 g/dL 7.7 7.8 7.8  Total Bilirubin 0.3 - 1.2 mg/dL 0.6 0.7 0.6  Alkaline Phos 38 - 126 U/L 97 89 70  AST 15 - 41 U/L 14(L) 16 15  ALT 0 - 44 U/L $Remo'14 16 13      'jjuhW$ RADIOGRAPHIC STUDIES: I have personally reviewed the radiological images as listed and agreed with the findings in the report. No results found.   ASSESSMENT & PLAN:  Kathleen Gibson is a 70 y.o. female with   1. [name] with severe thrombocytopenia  -She was initially diagnosed with ITP in 2014. She lost follow up after 11/2017 and did not proceed with recommended bone marrow biopsy. -She was referred to ED 10/28/20 by her PCP for low plt count of 4k. She was hospitalized and treated with platelet transfusions, IVIG, oral prednisone $RemoveBeforeD'60mg'dsYpeJFKSexyNz$  and Nplate injections. She tapered off prednisone given little response. -Bone marrow biopsy 10/30/20 felt to likely represent MDS/MPN, particularly [name]. Confirmed diagnosis of [name] on slide review at [facility] by Dr. Linus Orn -She received treatment for severe refractory thrombocytopenia with weekly Rituxan x4 on 11/19/20, but she did not respond  -She began azacitidine daily days 1-5 q. 28 days on 12/28/20 -She had worsening thrombocytopenia after stopping Nplate, restarted weekly Nplate 10 mcg/kg on 12/04/7626 -Labs reviewed, plt 30k today which has improved, no need plt transfusion    2. Normocytic anemia, moderate  -h/o pernicious anemia/vitamin B12 deficiency -with recent severe thrombocytopenia, she has had loq ferritin and iron study with moderate anemia. She required blood transfusion in hospital. Will give blood transfusion for Hg <=7.5.  -Hg 8.9 today (01/18/21)   3. Type 2 diabetes mellitus with polyneuropathy,  Hypertension -Continue medication, she will follow-up with her family doctor   4. Left UQ abdominal pain -Likely secondary to splenomegaly -Is tolerable, continue monitoring.   5. Headache -no other neurological symptoms, will continue monitor clinically, take Tylenol as needed -If gets worse, will get a CT head     PLAN:  -proceed with Nplate today -C2 Azacitidine fusion on 01/25/21 (day 1-5 every 28 days) -labs and Nplate in 2, 3, and 4 weeks -f/u in 2 weeks -proceed with platelet transfusion if platelet count less than 10K    No problem-specific Assessment & Plan notes found for this encounter.   No orders of the defined types were placed in this encounter.  All questions were answered. The patient knows to call the clinic with any problems,  questions or concerns. No barriers to learning was detected. The total time spent in the appointment was 30 minutes.     Truitt Merle, MD 01/18/2021   I, Wilburn Mylar, am acting as scribe for Truitt Merle, MD.   I have reviewed the above documentation for accuracy and completeness, and I agree with the above.

## 2021-01-18 NOTE — Patient Instructions (Signed)
Romiplostim injection What is this medication? ROMIPLOSTIM (roe mi PLOE stim) helps your body make more platelets. This medicine is used to treat low platelets caused by chronic idiopathic thrombocytopenic purpura (ITP) or a bone marrow syndrome caused by radiation sickness. This medicine may be used for other purposes; ask your health care provider or pharmacist if you have questions. COMMON BRAND NAME(S): Nplate What should I tell my care team before I take this medication? They need to know if you have any of these conditions: blood clots myelodysplastic syndrome an unusual or allergic reaction to romiplostim, mannitol, other medicines, foods, dyes, or preservatives pregnant or trying to get pregnant breast-feeding How should I use this medication? This medicine is injected under the skin. It is given by a health care provider in a hospital or clinic setting. A special MedGuide will be given to you before each treatment. Be sure to read this information carefully each time. Talk to your health care provider about the use of this medicine in children. While it may be prescribed for children as young as newborns for selected conditions, precautions do apply. Overdosage: If you think you have taken too much of this medicine contact a poison control center or emergency room at once. NOTE: This medicine is only for you. Do not share this medicine with others. What if I miss a dose? Keep appointments for follow-up doses. It is important not to miss your dose. Call your health care provider if you are unable to keep an appointment. What may interact with this medication? Interactions are not expected. This list may not describe all possible interactions. Give your health care provider a list of all the medicines, herbs, non-prescription drugs, or dietary supplements you use. Also tell them if you smoke, drink alcohol, or use illegal drugs. Some items may interact with your medicine. What should I  watch for while using this medication? Visit your health care provider for regular checks on your progress. You may need blood work done while you are taking this medicine. Your condition will be monitored carefully while you are receiving this medicine. It is important not to miss any appointments. What side effects may I notice from receiving this medication? Side effects that you should report to your doctor or health care professional as soon as possible: allergic reactions (skin rash, itching or hives; swelling of the face, lips, or tongue) bleeding (bloody or black, tarry stools; red or dark brown urine; spitting up blood or brown material that looks like coffee grounds; red spots on the skin; unusual bruising or bleeding from the eyes, gums, or nose) blood clot (chest pain; shortness of breath; pain, swelling, or warmth in the leg) stroke (changes in vision; confusion; trouble speaking or understanding; severe headaches; sudden numbness or weakness of the face, arm or leg; trouble walking; dizziness; loss of balance or coordination) Side effects that usually do not require medical attention (report to your doctor or health care professional if they continue or are bothersome): diarrhea dizziness headache joint pain muscle pain stomach pain trouble sleeping This list may not describe all possible side effects. Call your doctor for medical advice about side effects. You may report side effects to FDA at 1-800-FDA-1088. Where should I keep my medication? This medicine is given in a hospital or clinic. It will not be stored at home. NOTE: This sheet is a summary. It may not cover all possible information. If you have questions about this medicine, talk to your doctor, pharmacist, or health care provider.    2022 Elsevier/Gold Standard (2019-07-15 10:28:13)  

## 2021-01-19 ENCOUNTER — Telehealth: Payer: Self-pay

## 2021-01-19 ENCOUNTER — Other Ambulatory Visit: Payer: Self-pay

## 2021-01-19 DIAGNOSIS — M549 Dorsalgia, unspecified: Secondary | ICD-10-CM

## 2021-01-19 MED ORDER — CYCLOBENZAPRINE HCL 5 MG PO TABS: 5.0000 mg | ORAL_TABLET | Freq: Three times a day (TID) | ORAL | 0 refills | Status: DC | PRN

## 2021-01-19 NOTE — Telephone Encounter (Signed)
This nurse received call from this patient, stated that she had an office visit on 8/8 with Dr. Burr Medico and informed her about back pain.  Dr. Burr Medico advised patient to call back if the pain gets worse.  Patient states that the pain is worse today and she would like to request the muscle relaxer that was suggested per the conversation she had with MD in office visit. This nurse advised that this request will be forwarded to the MD.  No further questions or concerns at this time.

## 2021-01-21 ENCOUNTER — Inpatient Hospital Stay: Payer: Medicare HMO

## 2021-01-21 ENCOUNTER — Telehealth: Payer: Self-pay

## 2021-01-21 ENCOUNTER — Other Ambulatory Visit: Payer: Self-pay

## 2021-01-21 ENCOUNTER — Encounter: Payer: Self-pay | Admitting: Hematology

## 2021-01-21 ENCOUNTER — Other Ambulatory Visit: Payer: Medicare HMO

## 2021-01-21 DIAGNOSIS — D696 Thrombocytopenia, unspecified: Secondary | ICD-10-CM

## 2021-01-21 DIAGNOSIS — M549 Dorsalgia, unspecified: Secondary | ICD-10-CM | POA: Diagnosis not present

## 2021-01-21 DIAGNOSIS — D693 Immune thrombocytopenic purpura: Secondary | ICD-10-CM

## 2021-01-21 DIAGNOSIS — Z9221 Personal history of antineoplastic chemotherapy: Secondary | ICD-10-CM | POA: Diagnosis not present

## 2021-01-21 DIAGNOSIS — C9310 Chronic myelomonocytic leukemia not having achieved remission: Secondary | ICD-10-CM | POA: Diagnosis not present

## 2021-01-21 NOTE — Telephone Encounter (Signed)
Chart review.  No changes made

## 2021-01-21 NOTE — Telephone Encounter (Signed)
This nurse attempted to reach patient per Dr. Burr Medico to inform her that MD has reviewed labs from office visit with Dr. Linus Orn this morning and patient does not need to come today for infusion.  Patient may return for chemo on Monday 8/15.  No answer and unable to leave a voicemail.  This nurse also attempted to reach patients daughter which was also unsuccessful.

## 2021-01-22 MED FILL — Dexamethasone Sodium Phosphate Inj 100 MG/10ML: INTRAMUSCULAR | Qty: 1 | Status: AC

## 2021-01-23 ENCOUNTER — Encounter: Payer: Self-pay | Admitting: Hematology

## 2021-01-25 ENCOUNTER — Inpatient Hospital Stay: Payer: Medicare HMO

## 2021-01-25 ENCOUNTER — Other Ambulatory Visit: Payer: Self-pay

## 2021-01-25 ENCOUNTER — Other Ambulatory Visit: Payer: Self-pay | Admitting: Hematology

## 2021-01-25 VITALS — BP 108/61 | HR 91 | Temp 98.3°F | Resp 20

## 2021-01-25 DIAGNOSIS — D510 Vitamin B12 deficiency anemia due to intrinsic factor deficiency: Secondary | ICD-10-CM | POA: Diagnosis not present

## 2021-01-25 DIAGNOSIS — C9310 Chronic myelomonocytic leukemia not having achieved remission: Secondary | ICD-10-CM | POA: Diagnosis not present

## 2021-01-25 DIAGNOSIS — D693 Immune thrombocytopenic purpura: Secondary | ICD-10-CM | POA: Diagnosis not present

## 2021-01-25 DIAGNOSIS — E1142 Type 2 diabetes mellitus with diabetic polyneuropathy: Secondary | ICD-10-CM | POA: Diagnosis not present

## 2021-01-25 DIAGNOSIS — R519 Headache, unspecified: Secondary | ICD-10-CM | POA: Diagnosis not present

## 2021-01-25 DIAGNOSIS — R1012 Left upper quadrant pain: Secondary | ICD-10-CM | POA: Diagnosis not present

## 2021-01-25 DIAGNOSIS — D696 Thrombocytopenia, unspecified: Secondary | ICD-10-CM

## 2021-01-25 DIAGNOSIS — Z5111 Encounter for antineoplastic chemotherapy: Secondary | ICD-10-CM | POA: Diagnosis not present

## 2021-01-25 LAB — CBC WITH DIFFERENTIAL (CANCER CENTER ONLY)
Abs Immature Granulocytes: 0.35 10*3/uL — ABNORMAL HIGH (ref 0.00–0.07)
Basophils Absolute: 0.1 10*3/uL (ref 0.0–0.1)
Basophils Relative: 1 %
Eosinophils Absolute: 0 10*3/uL (ref 0.0–0.5)
Eosinophils Relative: 0 %
HCT: 26.5 % — ABNORMAL LOW (ref 36.0–46.0)
Hemoglobin: 8.5 g/dL — ABNORMAL LOW (ref 12.0–15.0)
Immature Granulocytes: 7 %
Lymphocytes Relative: 12 %
Lymphs Abs: 0.6 10*3/uL — ABNORMAL LOW (ref 0.7–4.0)
MCH: 28.1 pg (ref 26.0–34.0)
MCHC: 32.1 g/dL (ref 30.0–36.0)
MCV: 87.7 fL (ref 80.0–100.0)
Monocytes Absolute: 1.2 10*3/uL — ABNORMAL HIGH (ref 0.1–1.0)
Monocytes Relative: 23 %
Neutro Abs: 3.1 10*3/uL (ref 1.7–7.7)
Neutrophils Relative %: 57 %
Platelet Count: 46 10*3/uL — ABNORMAL LOW (ref 150–400)
RBC: 3.02 MIL/uL — ABNORMAL LOW (ref 3.87–5.11)
RDW: 18.2 % — ABNORMAL HIGH (ref 11.5–15.5)
WBC Count: 5.4 10*3/uL (ref 4.0–10.5)
nRBC: 0 % (ref 0.0–0.2)

## 2021-01-25 LAB — CMP (CANCER CENTER ONLY)
ALT: 12 U/L (ref 0–44)
AST: 16 U/L (ref 15–41)
Albumin: 3.2 g/dL — ABNORMAL LOW (ref 3.5–5.0)
Alkaline Phosphatase: 99 U/L (ref 38–126)
Anion gap: 9 (ref 5–15)
BUN: 14 mg/dL (ref 8–23)
CO2: 25 mmol/L (ref 22–32)
Calcium: 8.7 mg/dL — ABNORMAL LOW (ref 8.9–10.3)
Chloride: 104 mmol/L (ref 98–111)
Creatinine: 0.88 mg/dL (ref 0.44–1.00)
GFR, Estimated: >60 mL/min (ref >60)
Glucose, Bld: 262 mg/dL — ABNORMAL HIGH (ref 70–99)
Potassium: 4.3 mmol/L (ref 3.5–5.1)
Sodium: 138 mmol/L (ref 135–145)
Total Bilirubin: 0.6 mg/dL (ref 0.3–1.2)
Total Protein: 7.5 g/dL (ref 6.5–8.1)

## 2021-01-25 MED ORDER — ROMIPLOSTIM INJECTION 500 MCG
10.0000 ug/kg | Freq: Once | SUBCUTANEOUS | Status: AC
  Administered 2021-01-25: 750 ug via SUBCUTANEOUS
  Filled 2021-01-25: qty 1

## 2021-01-25 MED ORDER — SODIUM CHLORIDE 0.9 % IV SOLN
75.0000 mg/m2 | Freq: Once | INTRAVENOUS | Status: AC
  Administered 2021-01-25: 135 mg via INTRAVENOUS
  Filled 2021-01-25: qty 13.5

## 2021-01-25 MED ORDER — HEPARIN SOD (PORK) LOCK FLUSH 100 UNIT/ML IV SOLN
250.0000 [IU] | Freq: Once | INTRAVENOUS | Status: AC | PRN
Start: 2021-01-25 — End: 2021-01-25
  Administered 2021-01-25: 250 [IU]

## 2021-01-25 MED ORDER — PALONOSETRON HCL INJECTION 0.25 MG/5ML
0.2500 mg | Freq: Once | INTRAVENOUS | Status: AC
  Administered 2021-01-25: 0.25 mg via INTRAVENOUS
  Filled 2021-01-25: qty 5

## 2021-01-25 MED ORDER — SODIUM CHLORIDE 0.9 % IV SOLN
10.0000 mg | Freq: Once | INTRAVENOUS | Status: AC
  Administered 2021-01-25: 10 mg via INTRAVENOUS
  Filled 2021-01-25: qty 10

## 2021-01-25 MED ORDER — SODIUM CHLORIDE 0.9 % IV SOLN: Freq: Once | INTRAVENOUS | Status: AC

## 2021-01-25 MED ORDER — SODIUM CHLORIDE 0.9% FLUSH
10.0000 mL | INTRAVENOUS | Status: DC | PRN
  Administered 2021-01-25: 10 mL

## 2021-01-25 NOTE — Patient Instructions (Signed)
[facility]  Discharge Instructions: Thank you for choosing [facility] to provide your oncology and hematology care.   If you have a lab appointment with the [facility], please go directly to the [facility] and check in at the registration area.   Wear comfortable clothing and clothing appropriate for easy access to any Portacath or PICC line.   We strive to give you quality time with your provider. You may need to reschedule your appointment if you arrive late (15 or more minutes).  Arriving late affects you and other patients whose appointments are after yours.  Also, if you miss three or more appointments without notifying the office, you may be dismissed from the clinic at the provider's discretion.      For prescription refill requests, have your pharmacy contact our office and allow 72 hours for refills to be completed.    Today you received the following chemotherapy and/or immunotherapy agents:  Azacitabine      To help prevent nausea and vomiting after your treatment, we encourage you to take your nausea medication as directed.  BELOW ARE SYMPTOMS THAT SHOULD BE REPORTED IMMEDIATELY: *FEVER GREATER THAN 100.4 F (38 C) OR HIGHER *CHILLS OR SWEATING *NAUSEA AND VOMITING THAT IS NOT CONTROLLED WITH YOUR NAUSEA MEDICATION *UNUSUAL SHORTNESS OF BREATH *UNUSUAL BRUISING OR BLEEDING *URINARY PROBLEMS (pain or burning when urinating, or frequent urination) *BOWEL PROBLEMS (unusual diarrhea, constipation, pain near the anus) TENDERNESS IN MOUTH AND THROAT WITH OR WITHOUT PRESENCE OF ULCERS (sore throat, sores in mouth, or a toothache) UNUSUAL RASH, SWELLING OR PAIN  UNUSUAL VAGINAL DISCHARGE OR ITCHING   Items with * indicate a potential emergency and should be followed up as soon as possible or go to the Emergency Department if any problems should occur.  Please show the CHEMOTHERAPY ALERT CARD or IMMUNOTHERAPY ALERT CARD at check-in  to the Emergency Department and triage nurse.  Should you have questions after your visit or need to cancel or reschedule your appointment, please contact [facility]  Dept: [phone]  and follow the prompts.  Office hours are 8:00 a.m. to 4:30 p.m. Monday - Friday. Please note that voicemails left after 4:00 p.m. may not be returned until the following business day.  We are closed weekends and major holidays. You have access to a nurse at all times for urgent questions. Please call the main number to the clinic Dept: [phone] and follow the prompts.   For any non-urgent questions, you may also contact your provider using MyChart. We now offer e-Visits for anyone 70 and older to request care online for non-urgent symptoms. For details visit [email or web address].   Also download the MyChart app! Go to the app store, search "MyChart", open the app, select [facility], and log in with your MyChart username and password.  Due to Covid, a mask is required upon entering the hospital/clinic. If you do not have a mask, one will be given to you upon arrival. For doctor visits, patients may have 1 support person aged 96 or older with them. For treatment visits, patients cannot have anyone with them due to current Covid guidelines and our immunocompromised population.

## 2021-01-25 NOTE — Patient Instructions (Signed)
PICC Home Care Guide A peripherally inserted central catheter (PICC) is a form of IV access that allows medicines and IV fluids to be quickly distributed throughout the body. The PICC is a long, thin, flexible tube (catheter) that is inserted into a vein in the upper arm. The catheter ends in a large vein in the chest (superior vena cava, or SVC). After the PICC is inserted, a chest X-ray may be done to make surethat it is in the correct place. A PICC may be placed for different reasons, such as: To give medicines and liquid nutrition. To give IV fluids and blood products. If there is trouble placing a peripheral intravenous (PIV) catheter. If taken care of properly, a PICC can remain in place for several months. Having a PICC can also allow a person to go home from the hospital sooner. Medicine and PICC care can be managed at home by a family member, caregiver, orhome health care team. What are the risks? Generally, having a PICC is safe. However, problems may occur, including: A blood clot (thrombus) forming in or at the tip of the PICC. A blood clot forming in a vein (deep vein thrombosis) or traveling to the lung (pulmonary embolism). Inflammation of the vein (phlebitis) in which the PICC is placed. Infection. Central line associated blood stream infection (CLABSI) is a serious infection that often requires hospitalization. PICC movement (malposition). The PICC tip may move from its original position due to excessive physical activity, forceful coughing, sneezing, or vomiting. A break or cut in the PICC. It is important not to use scissors near the PICC. Nerve or tendon irritation or injury during PICC insertion. How to take care of your PICC Preventing problems You and any caregivers should wash your hands often with soap. Wash hands: Before touching the PICC line or the infusion device. Before changing a bandage (dressing). Flush the PICC as told by your health care provider. Let your  health care provider know right away if the PICC is hard to flush or does not flush. Do not use force to flush the PICC. Do not use a syringe that is less than 10 mL to flush the PICC. Avoid blood pressure checks on the arm in which the PICC is placed. Never pull or tug on the PICC. Do not take the PICC out yourself. Only a trained clinical professional should remove the PICC. Use clean and sterile supplies only. Keep the supplies in a dry place. Do not reuse needles, syringes, or any other supplies. Doing that can lead to infection. Keep pets and children away from your PICC line. Check the PICC insertion site every day for signs of infection. Check for: Leakage. Redness, swelling, or pain. Fluid or blood. Warmth. Pus or a bad smell. PICC dressing care Keep your PICC bandage (dressing) clean and dry to prevent infection. Do not take baths, swim, or use a hot tub until your health care provider approves. Ask your health care provider if you can take showers. You may only be allowed to take sponge baths for bathing. When you are allowed to shower: Ask your health care provider to teach you how to wrap the PICC line. Cover the PICC line with clear plastic wrap and tape to keep it dry while showering. Follow instructions from your health care provider about how to take care of your insertion site and dressing. Make sure you: Wash your hands with soap and water before you change your bandage (dressing). If soap and water are not available,  use hand sanitizer. Change your dressing as told by your health care provider. Leave stitches (sutures), skin glue, or adhesive strips in place. These skin closures may need to stay in place for 2 weeks or longer. If adhesive strip edges start to loosen and curl up, you may trim the loose edges. Do not remove adhesive strips completely unless your health care provider tells you to do that. Change your PICC dressing if it becomes loose or wet. General  instructions  Carry your PICC identification card or wear a medical alert bracelet at all times. Keep the tube clamped at all times, unless it is being used. Carry a smooth-edge clamp with you at all times to place on the tube if it breaks. Do not use scissors or sharp objects near the tube. You may bend your arm and move it freely. If your PICC is near or at the bend of your elbow, avoid activity with repeated motion at the elbow. Avoid lifting heavy objects as told by your health care provider. Keep all follow-up visits as told by your health care provider. This is important.  Disposal of supplies Throw away any syringes in a disposal container that is meant for sharp items (sharps container). You can buy a sharps container from a pharmacy, or you can make one by using an empty hard plastic bottle with a cover. Place any used dressings or infusion bags into a plastic bag. Throw that bag in the trash. Contact a health care provider if: You have pain in your arm, ear, face, or teeth. You have a fever or chills. You have redness, swelling, or pain around the insertion site. You have fluid or blood coming from the insertion site. Your insertion site feels warm to the touch. You have pus or a bad smell coming from the insertion site. Your skin feels hard and raised around the insertion site. Get help right away if: Your PICC is accidentally pulled all the way out. If this happens, cover the insertion site with a bandage or gauze dressing. Do not throw the PICC away. Your health care provider will need to check it. Your PICC was tugged or pulled and has partially come out. Do not  push the PICC back in. You cannot flush the PICC, it is hard to flush, or the PICC leaks around the insertion site when it is flushed. You hear a "flushing" sound when the PICC is flushed. You feel your heart racing or skipping beats. There is a hole or tear in the PICC. You have swelling in the arm in which the  PICC was inserted. You have a red streak going up your arm from where the PICC was inserted. Summary A peripherally inserted central catheter (PICC) is a long, thin, flexible tube (catheter) that is inserted into a vein in the upper arm. The PICC is inserted using a sterile technique by a specially trained nurse or physician. Only a trained clinical professional should remove it. Keep your PICC identification card with you at all times. Avoid blood pressure checks on the arm in which the PICC is placed. If cared for properly, a PICC can remain in place for several months. Having a PICC can also allow a person to go home from the hospital sooner. This information is not intended to replace advice given to you by your health care provider. Make sure you discuss any questions you have with your healthcare provider. Document Revised: 10/09/2019 Document Reviewed: 10/09/2019 Elsevier Patient Education  2022 [employer].

## 2021-01-26 ENCOUNTER — Inpatient Hospital Stay: Payer: Medicare HMO

## 2021-01-26 VITALS — BP 119/59 | HR 94 | Temp 98.6°F | Resp 18

## 2021-01-26 DIAGNOSIS — R519 Headache, unspecified: Secondary | ICD-10-CM | POA: Diagnosis not present

## 2021-01-26 DIAGNOSIS — C9310 Chronic myelomonocytic leukemia not having achieved remission: Secondary | ICD-10-CM | POA: Diagnosis not present

## 2021-01-26 DIAGNOSIS — E1142 Type 2 diabetes mellitus with diabetic polyneuropathy: Secondary | ICD-10-CM | POA: Diagnosis not present

## 2021-01-26 DIAGNOSIS — D510 Vitamin B12 deficiency anemia due to intrinsic factor deficiency: Secondary | ICD-10-CM | POA: Diagnosis not present

## 2021-01-26 DIAGNOSIS — R1012 Left upper quadrant pain: Secondary | ICD-10-CM | POA: Diagnosis not present

## 2021-01-26 DIAGNOSIS — Z5111 Encounter for antineoplastic chemotherapy: Secondary | ICD-10-CM | POA: Diagnosis not present

## 2021-01-26 DIAGNOSIS — D693 Immune thrombocytopenic purpura: Secondary | ICD-10-CM | POA: Diagnosis not present

## 2021-01-26 MED ORDER — HEPARIN SOD (PORK) LOCK FLUSH 100 UNIT/ML IV SOLN
250.0000 [IU] | Freq: Once | INTRAVENOUS | Status: AC | PRN
  Administered 2021-01-26: 250 [IU]

## 2021-01-26 MED ORDER — SODIUM CHLORIDE 0.9 % IV SOLN
10.0000 mg | Freq: Once | INTRAVENOUS | Status: AC
  Administered 2021-01-26: 10 mg via INTRAVENOUS
  Filled 2021-01-26: qty 10

## 2021-01-26 MED ORDER — SODIUM CHLORIDE 0.9 % IV SOLN
Freq: Once | INTRAVENOUS | Status: AC
Start: 2021-01-26 — End: 2021-01-26

## 2021-01-26 MED ORDER — SODIUM CHLORIDE 0.9% FLUSH
3.0000 mL | INTRAVENOUS | Status: DC | PRN
  Administered 2021-01-26: 3 mL

## 2021-01-26 MED ORDER — SODIUM CHLORIDE 0.9 % IV SOLN
75.0000 mg/m2 | Freq: Once | INTRAVENOUS | Status: AC
  Administered 2021-01-26: 135 mg via INTRAVENOUS
  Filled 2021-01-26: qty 13.5

## 2021-01-26 MED FILL — Dexamethasone Sodium Phosphate Inj 100 MG/10ML: INTRAMUSCULAR | Qty: 1 | Status: AC

## 2021-01-26 NOTE — Patient Instructions (Signed)
[facility]  Discharge Instructions: Thank you for choosing [facility] to provide your oncology and hematology care.   If you have a lab appointment with the Cancer Center, please go directly to the Cancer Center and check in at the registration area.   Wear comfortable clothing and clothing appropriate for easy access to any Portacath or PICC line.   We strive to give you quality time with your provider. You may need to reschedule your appointment if you arrive late (15 or more minutes).  Arriving late affects you and other patients whose appointments are after yours.  Also, if you miss three or more appointments without notifying the office, you may be dismissed from the clinic at the provider's discretion.      For prescription refill requests, have your pharmacy contact our office and allow 72 hours for refills to be completed.    Today you received the following chemotherapy and/or immunotherapy agent: Azacitidine (Vidaza)      To help prevent nausea and vomiting after your treatment, we encourage you to take your nausea medication as directed.  BELOW ARE SYMPTOMS THAT SHOULD BE REPORTED IMMEDIATELY: . *FEVER GREATER THAN 100.4 F (38 C) OR HIGHER . *CHILLS OR SWEATING . *NAUSEA AND VOMITING THAT IS NOT CONTROLLED WITH YOUR NAUSEA MEDICATION . *UNUSUAL SHORTNESS OF BREATH . *UNUSUAL BRUISING OR BLEEDING . *URINARY PROBLEMS (pain or burning when urinating, or frequent urination) . *BOWEL PROBLEMS (unusual diarrhea, constipation, pain near the anus) . TENDERNESS IN MOUTH AND THROAT WITH OR WITHOUT PRESENCE OF ULCERS (sore throat, sores in mouth, or a toothache) . UNUSUAL RASH, SWELLING OR PAIN  . UNUSUAL VAGINAL DISCHARGE OR ITCHING   Items with * indicate a potential emergency and should be followed up as soon as possible or go to the Emergency Department if any problems should occur.  Please show the CHEMOTHERAPY ALERT CARD or  IMMUNOTHERAPY ALERT CARD at check-in to the Emergency Department and triage nurse.  Should you have questions after your visit or need to cancel or reschedule your appointment, please contact [facility]  Dept: [phone]  and follow the prompts.  Office hours are 8:00 a.m. to 4:30 p.m. Monday - Friday. Please note that voicemails left after 4:00 p.m. may not be returned until the following business day.  We are closed weekends and major holidays. You have access to a nurse at all times for urgent questions. Please call the main number to the clinic Dept: [phone] and follow the prompts.   For any non-urgent questions, you may also contact your provider using MyChart. We now offer e-Visits for anyone 18 and older to request care online for non-urgent symptoms. For details visit [email or web address].   Also download the MyChart app! Go to the app store, search "MyChart", open the app, select [facility], and log in with your MyChart username and password.  Due to Covid, a mask is required upon entering the hospital/clinic. If you do not have a mask, one will be given to you upon arrival. For doctor visits, patients may have 1 support person aged 18 or older with them. For treatment visits, patients cannot have anyone with them due to current Covid guidelines and our immunocompromised population.   

## 2021-01-27 ENCOUNTER — Other Ambulatory Visit: Payer: Self-pay

## 2021-01-27 ENCOUNTER — Inpatient Hospital Stay: Payer: Medicare HMO

## 2021-01-27 VITALS — BP 115/60 | HR 95 | Temp 98.9°F | Resp 18

## 2021-01-27 DIAGNOSIS — C9310 Chronic myelomonocytic leukemia not having achieved remission: Secondary | ICD-10-CM | POA: Diagnosis not present

## 2021-01-27 DIAGNOSIS — R519 Headache, unspecified: Secondary | ICD-10-CM | POA: Diagnosis not present

## 2021-01-27 DIAGNOSIS — D510 Vitamin B12 deficiency anemia due to intrinsic factor deficiency: Secondary | ICD-10-CM | POA: Diagnosis not present

## 2021-01-27 DIAGNOSIS — E1142 Type 2 diabetes mellitus with diabetic polyneuropathy: Secondary | ICD-10-CM | POA: Diagnosis not present

## 2021-01-27 DIAGNOSIS — D693 Immune thrombocytopenic purpura: Secondary | ICD-10-CM | POA: Diagnosis not present

## 2021-01-27 DIAGNOSIS — Z5111 Encounter for antineoplastic chemotherapy: Secondary | ICD-10-CM | POA: Diagnosis not present

## 2021-01-27 DIAGNOSIS — R1012 Left upper quadrant pain: Secondary | ICD-10-CM | POA: Diagnosis not present

## 2021-01-27 MED ORDER — PALONOSETRON HCL INJECTION 0.25 MG/5ML
0.2500 mg | Freq: Once | INTRAVENOUS | Status: AC
  Administered 2021-01-27: 0.25 mg via INTRAVENOUS
  Filled 2021-01-27: qty 5

## 2021-01-27 MED ORDER — SODIUM CHLORIDE 0.9 % IV SOLN: Freq: Once | INTRAVENOUS | Status: AC

## 2021-01-27 MED ORDER — HEPARIN SOD (PORK) LOCK FLUSH 100 UNIT/ML IV SOLN
250.0000 [IU] | Freq: Once | INTRAVENOUS | Status: AC | PRN
Start: 2021-01-27 — End: 2021-01-27
  Administered 2021-01-27: 250 [IU]

## 2021-01-27 MED ORDER — SODIUM CHLORIDE 0.9 % IV SOLN
10.0000 mg | Freq: Once | INTRAVENOUS | Status: AC
  Administered 2021-01-27: 10 mg via INTRAVENOUS
  Filled 2021-01-27: qty 10

## 2021-01-27 MED ORDER — SODIUM CHLORIDE 0.9 % IV SOLN
75.0000 mg/m2 | Freq: Once | INTRAVENOUS | Status: AC
  Administered 2021-01-27: 135 mg via INTRAVENOUS
  Filled 2021-01-27: qty 13.5

## 2021-01-27 MED ORDER — SODIUM CHLORIDE 0.9% FLUSH
3.0000 mL | INTRAVENOUS | Status: DC | PRN
  Administered 2021-01-27: 3 mL

## 2021-01-27 MED FILL — Dexamethasone Sodium Phosphate Inj 100 MG/10ML: INTRAMUSCULAR | Qty: 1 | Status: AC

## 2021-01-27 NOTE — Patient Instructions (Signed)
[facility]  Discharge Instructions: Thank you for choosing [facility] to provide your oncology and hematology care.   If you have a lab appointment with the Cancer Center, please go directly to the Cancer Center and check in at the registration area.   Wear comfortable clothing and clothing appropriate for easy access to any Portacath or PICC line.   We strive to give you quality time with your provider. You may need to reschedule your appointment if you arrive late (15 or more minutes).  Arriving late affects you and other patients whose appointments are after yours.  Also, if you miss three or more appointments without notifying the office, you may be dismissed from the clinic at the provider's discretion.      For prescription refill requests, have your pharmacy contact our office and allow 72 hours for refills to be completed.   Today you received the following chemotherapy and/or immunotherapy agents Vidaza     To help prevent nausea and vomiting after your treatment, we encourage you to take your nausea medication as directed.  BELOW ARE SYMPTOMS THAT SHOULD BE REPORTED IMMEDIATELY: *FEVER GREATER THAN 100.4 F (38 C) OR HIGHER *CHILLS OR SWEATING *NAUSEA AND VOMITING THAT IS NOT CONTROLLED WITH YOUR NAUSEA MEDICATION *UNUSUAL SHORTNESS OF BREATH *UNUSUAL BRUISING OR BLEEDING *URINARY PROBLEMS (pain or burning when urinating, or frequent urination) *BOWEL PROBLEMS (unusual diarrhea, constipation, pain near the anus) TENDERNESS IN MOUTH AND THROAT WITH OR WITHOUT PRESENCE OF ULCERS (sore throat, sores in mouth, or a toothache) UNUSUAL RASH, SWELLING OR PAIN  UNUSUAL VAGINAL DISCHARGE OR ITCHING   Items with * indicate a potential emergency and should be followed up as soon as possible or go to the Emergency Department if any problems should occur.  Please show the CHEMOTHERAPY ALERT CARD or IMMUNOTHERAPY ALERT CARD at check-in to the  Emergency Department and triage nurse.  Should you have questions after your visit or need to cancel or reschedule your appointment, please contact [facility]  Dept: [phone]  and follow the prompts.  Office hours are 8:00 a.m. to 4:30 p.m. Monday - Friday. Please note that voicemails left after 4:00 p.m. may not be returned until the following business day.  We are closed weekends and major holidays. You have access to a nurse at all times for urgent questions. Please call the main number to the clinic Dept: [phone] and follow the prompts.   For any non-urgent questions, you may also contact your provider using MyChart. We now offer e-Visits for anyone 18 and older to request care online for non-urgent symptoms. For details visit [email or web address].   Also download the MyChart app! Go to the app store, search "MyChart", open the app, select [facility], and log in with your MyChart username and password.  Due to Covid, a mask is required upon entering the hospital/clinic. If you do not have a mask, one will be given to you upon arrival. For doctor visits, patients may have 1 support person aged 18 or older with them. For treatment visits, patients cannot have anyone with them due to current Covid guidelines and our immunocompromised population.   

## 2021-01-28 ENCOUNTER — Inpatient Hospital Stay: Payer: Medicare HMO

## 2021-01-28 VITALS — BP 126/69 | HR 91 | Resp 18 | Wt 166.0 lb

## 2021-01-28 DIAGNOSIS — D693 Immune thrombocytopenic purpura: Secondary | ICD-10-CM | POA: Diagnosis not present

## 2021-01-28 DIAGNOSIS — Z452 Encounter for adjustment and management of vascular access device: Secondary | ICD-10-CM

## 2021-01-28 DIAGNOSIS — D696 Thrombocytopenia, unspecified: Secondary | ICD-10-CM

## 2021-01-28 DIAGNOSIS — D510 Vitamin B12 deficiency anemia due to intrinsic factor deficiency: Secondary | ICD-10-CM | POA: Diagnosis not present

## 2021-01-28 DIAGNOSIS — C9310 Chronic myelomonocytic leukemia not having achieved remission: Secondary | ICD-10-CM

## 2021-01-28 DIAGNOSIS — R519 Headache, unspecified: Secondary | ICD-10-CM | POA: Diagnosis not present

## 2021-01-28 DIAGNOSIS — E1142 Type 2 diabetes mellitus with diabetic polyneuropathy: Secondary | ICD-10-CM | POA: Diagnosis not present

## 2021-01-28 DIAGNOSIS — Z95828 Presence of other vascular implants and grafts: Secondary | ICD-10-CM

## 2021-01-28 DIAGNOSIS — R1012 Left upper quadrant pain: Secondary | ICD-10-CM | POA: Diagnosis not present

## 2021-01-28 DIAGNOSIS — Z5111 Encounter for antineoplastic chemotherapy: Secondary | ICD-10-CM | POA: Diagnosis not present

## 2021-01-28 LAB — CBC WITH DIFFERENTIAL (CANCER CENTER ONLY)
Abs Immature Granulocytes: 0.75 10*3/uL — ABNORMAL HIGH (ref 0.00–0.07)
Basophils Absolute: 0.1 10*3/uL (ref 0.0–0.1)
Basophils Relative: 1 %
Eosinophils Absolute: 0 10*3/uL (ref 0.0–0.5)
Eosinophils Relative: 0 %
HCT: 27.1 % — ABNORMAL LOW (ref 36.0–46.0)
Hemoglobin: 8.6 g/dL — ABNORMAL LOW (ref 12.0–15.0)
Immature Granulocytes: 9 %
Lymphocytes Relative: 12 %
Lymphs Abs: 1 10*3/uL (ref 0.7–4.0)
MCH: 28.1 pg (ref 26.0–34.0)
MCHC: 31.7 g/dL (ref 30.0–36.0)
MCV: 88.6 fL (ref 80.0–100.0)
Monocytes Absolute: 1.4 10*3/uL — ABNORMAL HIGH (ref 0.1–1.0)
Monocytes Relative: 17 %
Neutro Abs: 5 10*3/uL (ref 1.7–7.7)
Neutrophils Relative %: 61 %
Platelet Count: 33 10*3/uL — ABNORMAL LOW (ref 150–400)
RBC: 3.06 MIL/uL — ABNORMAL LOW (ref 3.87–5.11)
RDW: 18.1 % — ABNORMAL HIGH (ref 11.5–15.5)
WBC Count: 8.2 10*3/uL (ref 4.0–10.5)
nRBC: 0 % (ref 0.0–0.2)

## 2021-01-28 LAB — CMP (CANCER CENTER ONLY)
ALT: 17 U/L (ref 0–44)
AST: 20 U/L (ref 15–41)
Albumin: 3.4 g/dL — ABNORMAL LOW (ref 3.5–5.0)
Alkaline Phosphatase: 114 U/L (ref 38–126)
Anion gap: 8 (ref 5–15)
BUN: 15 mg/dL (ref 8–23)
CO2: 25 mmol/L (ref 22–32)
Calcium: 9 mg/dL (ref 8.9–10.3)
Chloride: 101 mmol/L (ref 98–111)
Creatinine: 1.01 mg/dL — ABNORMAL HIGH (ref 0.44–1.00)
GFR, Estimated: >60 mL/min (ref >60)
Glucose, Bld: 284 mg/dL — ABNORMAL HIGH (ref 70–99)
Potassium: 4 mmol/L (ref 3.5–5.1)
Sodium: 134 mmol/L — ABNORMAL LOW (ref 135–145)
Total Bilirubin: 0.7 mg/dL (ref 0.3–1.2)
Total Protein: 7.7 g/dL (ref 6.5–8.1)

## 2021-01-28 MED ORDER — SODIUM CHLORIDE 0.9 % IV SOLN: Freq: Once | INTRAVENOUS | Status: AC

## 2021-01-28 MED ORDER — SODIUM CHLORIDE 0.9 % IV SOLN
10.0000 mg | Freq: Once | INTRAVENOUS | Status: AC
  Administered 2021-01-28: 10 mg via INTRAVENOUS
  Filled 2021-01-28: qty 10

## 2021-01-28 MED ORDER — SODIUM CHLORIDE 0.9 % IV SOLN
75.0000 mg/m2 | Freq: Once | INTRAVENOUS | Status: AC
  Administered 2021-01-28: 135 mg via INTRAVENOUS
  Filled 2021-01-28: qty 13.5

## 2021-01-28 MED ORDER — HEPARIN SOD (PORK) LOCK FLUSH 100 UNIT/ML IV SOLN
250.0000 [IU] | Freq: Once | INTRAVENOUS | Status: AC | PRN
  Administered 2021-01-28: 250 [IU]

## 2021-01-28 MED ORDER — SODIUM CHLORIDE 0.9% FLUSH
10.0000 mL | INTRAVENOUS | Status: AC | PRN
  Administered 2021-01-28: 10 mL

## 2021-01-28 MED ORDER — SODIUM CHLORIDE 0.9% FLUSH
10.0000 mL | INTRAVENOUS | Status: DC | PRN
  Administered 2021-01-28: 10 mL

## 2021-01-28 MED FILL — Dexamethasone Sodium Phosphate Inj 100 MG/10ML: INTRAMUSCULAR | Qty: 1 | Status: AC

## 2021-01-28 NOTE — Progress Notes (Signed)
Per Dr. Burr Medico no need for platelet transfusion.

## 2021-01-29 ENCOUNTER — Inpatient Hospital Stay (HOSPITAL_BASED_OUTPATIENT_CLINIC_OR_DEPARTMENT_OTHER): Payer: Medicare HMO | Admitting: Hematology

## 2021-01-29 ENCOUNTER — Inpatient Hospital Stay: Payer: Medicare HMO

## 2021-01-29 ENCOUNTER — Other Ambulatory Visit: Payer: Self-pay

## 2021-01-29 VITALS — BP 117/64 | HR 91 | Temp 98.0°F | Resp 18 | Wt 164.8 lb

## 2021-01-29 DIAGNOSIS — C9310 Chronic myelomonocytic leukemia not having achieved remission: Secondary | ICD-10-CM

## 2021-01-29 DIAGNOSIS — R1012 Left upper quadrant pain: Secondary | ICD-10-CM | POA: Diagnosis not present

## 2021-01-29 DIAGNOSIS — D693 Immune thrombocytopenic purpura: Secondary | ICD-10-CM | POA: Diagnosis not present

## 2021-01-29 DIAGNOSIS — E538 Deficiency of other specified B group vitamins: Secondary | ICD-10-CM

## 2021-01-29 DIAGNOSIS — D510 Vitamin B12 deficiency anemia due to intrinsic factor deficiency: Secondary | ICD-10-CM | POA: Diagnosis not present

## 2021-01-29 DIAGNOSIS — E1142 Type 2 diabetes mellitus with diabetic polyneuropathy: Secondary | ICD-10-CM | POA: Diagnosis not present

## 2021-01-29 DIAGNOSIS — Z5111 Encounter for antineoplastic chemotherapy: Secondary | ICD-10-CM | POA: Diagnosis not present

## 2021-01-29 DIAGNOSIS — R519 Headache, unspecified: Secondary | ICD-10-CM | POA: Diagnosis not present

## 2021-01-29 MED ORDER — PALONOSETRON HCL INJECTION 0.25 MG/5ML
0.2500 mg | Freq: Once | INTRAVENOUS | Status: AC
  Administered 2021-01-29: 0.25 mg via INTRAVENOUS
  Filled 2021-01-29: qty 5

## 2021-01-29 MED ORDER — SODIUM CHLORIDE 0.9 % IV SOLN
75.0000 mg/m2 | Freq: Once | INTRAVENOUS | Status: AC
  Administered 2021-01-29: 135 mg via INTRAVENOUS
  Filled 2021-01-29: qty 13.5

## 2021-01-29 MED ORDER — SODIUM CHLORIDE 0.9 % IV SOLN
10.0000 mg | Freq: Once | INTRAVENOUS | Status: AC
  Administered 2021-01-29: 10 mg via INTRAVENOUS
  Filled 2021-01-29: qty 10

## 2021-01-29 MED ORDER — HEPARIN SOD (PORK) LOCK FLUSH 100 UNIT/ML IV SOLN
500.0000 [IU] | Freq: Once | INTRAVENOUS | Status: AC | PRN
  Administered 2021-01-29: 500 [IU]

## 2021-01-29 MED ORDER — SODIUM CHLORIDE 0.9 % IV SOLN: Freq: Once | INTRAVENOUS | Status: AC

## 2021-01-29 MED ORDER — SODIUM CHLORIDE 0.9% FLUSH
10.0000 mL | INTRAVENOUS | Status: DC | PRN
  Administered 2021-01-29: 10 mL

## 2021-01-29 NOTE — Progress Notes (Signed)
This nurse spoke with patient and verified that patient was feeling well.  She states that she is tired from being up all morning but feels fine otherwise.  This nurse informed patient per Dr. Burr Medico that her platelet levels having been doing well and she does not need to come for her appointments on Monday 8/22.  She can return for her appointment on Thursday 8/25.  Patient is in agreement.  No further questions or concerns at this time.  Appointments have been canceled.

## 2021-01-29 NOTE — Patient Instructions (Signed)
[facility]   °Discharge Instructions: °Thank you for choosing [facility] to provide your oncology and hematology care.  ° °If you have a lab appointment with the Cancer Center, please go directly to the Cancer Center and check in at the registration area. °  °Wear comfortable clothing and clothing appropriate for easy access to any Portacath or PICC line.  ° °We strive to give you quality time with your provider. You may need to reschedule your appointment if you arrive late (15 or more minutes).  Arriving late affects you and other patients whose appointments are after yours.  Also, if you miss three or more appointments without notifying the office, you may be dismissed from the clinic at the provider’s discretion.    °  °For prescription refill requests, have your pharmacy contact our office and allow 72 hours for refills to be completed.   ° °Today you received the following chemotherapy and/or immunotherapy agents: Azacitidine (Vidaza)    °  °To help prevent nausea and vomiting after your treatment, we encourage you to take your nausea medication as directed. ° °BELOW ARE SYMPTOMS THAT SHOULD BE REPORTED IMMEDIATELY: °*FEVER GREATER THAN 100.4 F (38 °C) OR HIGHER °*CHILLS OR SWEATING °*NAUSEA AND VOMITING THAT IS NOT CONTROLLED WITH YOUR NAUSEA MEDICATION °*UNUSUAL SHORTNESS OF BREATH °*UNUSUAL BRUISING OR BLEEDING °*URINARY PROBLEMS (pain or burning when urinating, or frequent urination) °*BOWEL PROBLEMS (unusual diarrhea, constipation, pain near the anus) °TENDERNESS IN MOUTH AND THROAT WITH OR WITHOUT PRESENCE OF ULCERS (sore throat, sores in mouth, or a toothache) °UNUSUAL RASH, SWELLING OR PAIN  °UNUSUAL VAGINAL DISCHARGE OR ITCHING  ° °Items with * indicate a potential emergency and should be followed up as soon as possible or go to the Emergency Department if any problems should occur. ° °Please show the CHEMOTHERAPY ALERT CARD or IMMUNOTHERAPY ALERT CARD at  check-in to the Emergency Department and triage nurse. ° °Should you have questions after your visit or need to cancel or reschedule your appointment, please contact [facility]  Dept: [phone]  and follow the prompts.  Office hours are 8:00 a.m. to 4:30 p.m. Monday - Friday. Please note that voicemails left after 4:00 p.m. may not be returned until the following business day.  We are closed weekends and major holidays. You have access to a nurse at all times for urgent questions. Please call the main number to the clinic Dept: [phone] and follow the prompts. ° ° °For any non-urgent questions, you may also contact your provider using MyChart. We now offer e-Visits for anyone 18 and older to request care online for non-urgent symptoms. For details visit [email or web address]. °  °Also download the MyChart app! Go to the app store, search "MyChart", open the app, select [facility], and log in with your MyChart username and password. ° °Due to Covid, a mask is required upon entering the hospital/clinic. If you do not have a mask, one will be given to you upon arrival. For doctor visits, patients may have 1 support person aged 18 or older with them. For treatment visits, patients cannot have anyone with them due to current Covid guidelines and our immunocompromised population.  ° °

## 2021-02-01 ENCOUNTER — Other Ambulatory Visit: Payer: Medicare HMO

## 2021-02-01 ENCOUNTER — Inpatient Hospital Stay: Payer: Medicare HMO

## 2021-02-01 ENCOUNTER — Encounter: Payer: Self-pay | Admitting: Hematology

## 2021-02-04 ENCOUNTER — Inpatient Hospital Stay: Payer: Medicare HMO

## 2021-02-04 ENCOUNTER — Other Ambulatory Visit: Payer: Self-pay

## 2021-02-04 VITALS — BP 114/62 | HR 80 | Temp 98.6°F | Resp 17

## 2021-02-04 DIAGNOSIS — D696 Thrombocytopenia, unspecified: Secondary | ICD-10-CM

## 2021-02-04 DIAGNOSIS — E1142 Type 2 diabetes mellitus with diabetic polyneuropathy: Secondary | ICD-10-CM | POA: Diagnosis not present

## 2021-02-04 DIAGNOSIS — Z5111 Encounter for antineoplastic chemotherapy: Secondary | ICD-10-CM | POA: Diagnosis not present

## 2021-02-04 DIAGNOSIS — D693 Immune thrombocytopenic purpura: Secondary | ICD-10-CM

## 2021-02-04 DIAGNOSIS — D510 Vitamin B12 deficiency anemia due to intrinsic factor deficiency: Secondary | ICD-10-CM | POA: Diagnosis not present

## 2021-02-04 DIAGNOSIS — Z452 Encounter for adjustment and management of vascular access device: Secondary | ICD-10-CM

## 2021-02-04 DIAGNOSIS — C9310 Chronic myelomonocytic leukemia not having achieved remission: Secondary | ICD-10-CM | POA: Diagnosis not present

## 2021-02-04 DIAGNOSIS — R1012 Left upper quadrant pain: Secondary | ICD-10-CM | POA: Diagnosis not present

## 2021-02-04 DIAGNOSIS — R519 Headache, unspecified: Secondary | ICD-10-CM | POA: Diagnosis not present

## 2021-02-04 LAB — CBC WITH DIFFERENTIAL (CANCER CENTER ONLY)
Abs Immature Granulocytes: 1.85 10*3/uL — ABNORMAL HIGH (ref 0.00–0.07)
Basophils Absolute: 0.2 10*3/uL — ABNORMAL HIGH (ref 0.0–0.1)
Basophils Relative: 1 %
Eosinophils Absolute: 0.1 10*3/uL (ref 0.0–0.5)
Eosinophils Relative: 1 %
HCT: 30 % — ABNORMAL LOW (ref 36.0–46.0)
Hemoglobin: 9.4 g/dL — ABNORMAL LOW (ref 12.0–15.0)
Immature Granulocytes: 14 %
Lymphocytes Relative: 6 %
Lymphs Abs: 0.8 10*3/uL (ref 0.7–4.0)
MCH: 27.6 pg (ref 26.0–34.0)
MCHC: 31.3 g/dL (ref 30.0–36.0)
MCV: 88.2 fL (ref 80.0–100.0)
Monocytes Absolute: 1.2 10*3/uL — ABNORMAL HIGH (ref 0.1–1.0)
Monocytes Relative: 9 %
Neutro Abs: 9.5 10*3/uL — ABNORMAL HIGH (ref 1.7–7.7)
Neutrophils Relative %: 69 %
Platelet Count: 11 10*3/uL — ABNORMAL LOW (ref 150–400)
RBC: 3.4 MIL/uL — ABNORMAL LOW (ref 3.87–5.11)
RDW: 18 % — ABNORMAL HIGH (ref 11.5–15.5)
WBC Count: 13.7 10*3/uL — ABNORMAL HIGH (ref 4.0–10.5)
nRBC: 0 % (ref 0.0–0.2)

## 2021-02-04 LAB — CMP (CANCER CENTER ONLY)
ALT: 14 U/L (ref 0–44)
AST: 14 U/L — ABNORMAL LOW (ref 15–41)
Albumin: 3.2 g/dL — ABNORMAL LOW (ref 3.5–5.0)
Alkaline Phosphatase: 118 U/L (ref 38–126)
Anion gap: 5 (ref 5–15)
BUN: 11 mg/dL (ref 8–23)
CO2: 27 mmol/L (ref 22–32)
Calcium: 8.8 mg/dL — ABNORMAL LOW (ref 8.9–10.3)
Chloride: 104 mmol/L (ref 98–111)
Creatinine: 0.77 mg/dL (ref 0.44–1.00)
GFR, Estimated: >60 mL/min (ref >60)
Glucose, Bld: 262 mg/dL — ABNORMAL HIGH (ref 70–99)
Potassium: 4.3 mmol/L (ref 3.5–5.1)
Sodium: 136 mmol/L (ref 135–145)
Total Bilirubin: 0.6 mg/dL (ref 0.3–1.2)
Total Protein: 7.6 g/dL (ref 6.5–8.1)

## 2021-02-04 MED ORDER — SODIUM CHLORIDE 0.9% FLUSH
3.0000 mL | INTRAVENOUS | Status: AC | PRN
  Administered 2021-02-04: 3 mL

## 2021-02-04 MED ORDER — HEPARIN SOD (PORK) LOCK FLUSH 100 UNIT/ML IV SOLN
250.0000 [IU] | INTRAVENOUS | Status: AC | PRN
  Administered 2021-02-04: 250 [IU]

## 2021-02-04 MED ORDER — ROMIPLOSTIM INJECTION 500 MCG
10.0000 ug/kg | Freq: Once | SUBCUTANEOUS | Status: AC
  Administered 2021-02-04: 750 ug via SUBCUTANEOUS
  Filled 2021-02-04: qty 1

## 2021-02-04 MED ORDER — SODIUM CHLORIDE 0.9% IV SOLUTION
250.0000 mL | Freq: Once | INTRAVENOUS | Status: AC
  Administered 2021-02-04: 250 mL via INTRAVENOUS

## 2021-02-04 MED ORDER — SODIUM CHLORIDE 0.9% FLUSH
10.0000 mL | INTRAVENOUS | Status: AC | PRN
  Administered 2021-02-04: 10 mL

## 2021-02-04 NOTE — Patient Instructions (Signed)
[facility]  Discharge Instructions: Thank you for choosing [facility] to provide your oncology and hematology care.   If you have a lab appointment with the [facility], please go directly to the [facility] and check in at the registration area.   Wear comfortable clothing and clothing appropriate for easy access to any Portacath or PICC line.   We strive to give you quality time with your provider. You may need to reschedule your appointment if you arrive late (15 or more minutes).  Arriving late affects you and other patients whose appointments are after yours.  Also, if you miss three or more appointments without notifying the office, you may be dismissed from the clinic at the provider's discretion.      For prescription refill requests, have your pharmacy contact our office and allow 72 hours for refills to be completed.    Today you received the following medication - Nplate      To help prevent nausea and vomiting after your treatment, we encourage you to take your nausea medication as directed.  BELOW ARE SYMPTOMS THAT SHOULD BE REPORTED IMMEDIATELY: *FEVER GREATER THAN 100.4 F (38 C) OR HIGHER *CHILLS OR SWEATING *NAUSEA AND VOMITING THAT IS NOT CONTROLLED WITH YOUR NAUSEA MEDICATION *UNUSUAL SHORTNESS OF BREATH *UNUSUAL BRUISING OR BLEEDING *URINARY PROBLEMS (pain or burning when urinating, or frequent urination) *BOWEL PROBLEMS (unusual diarrhea, constipation, pain near the anus) TENDERNESS IN MOUTH AND THROAT WITH OR WITHOUT PRESENCE OF ULCERS (sore throat, sores in mouth, or a toothache) UNUSUAL RASH, SWELLING OR PAIN  UNUSUAL VAGINAL DISCHARGE OR ITCHING   Items with * indicate a potential emergency and should be followed up as soon as possible or go to the Emergency Department if any problems should occur.  Please show the CHEMOTHERAPY ALERT CARD or IMMUNOTHERAPY ALERT CARD at check-in to the Emergency Department and  triage nurse.  Should you have questions after your visit or need to cancel or reschedule your appointment, please contact [facility]  Dept: [phone]  and follow the prompts.  Office hours are 8:00 a.m. to 4:30 p.m. Monday - Friday. Please note that voicemails left after 4:00 p.m. may not be returned until the following business day.  We are closed weekends and major holidays. You have access to a nurse at all times for urgent questions. Please call the main number to the clinic Dept: [phone] and follow the prompts.   For any non-urgent questions, you may also contact your provider using MyChart. We now offer e-Visits for anyone 107 and older to request care online for non-urgent symptoms. For details visit [email or web address].   Also download the MyChart app! Go to the app store, search "MyChart", open the app, select [facility], and log in with your MyChart username and password.  Due to Covid, a mask is required upon entering the hospital/clinic. If you do not have a mask, one will be given to you upon arrival. For doctor visits, patients may have 1 support person aged 9 or older with them. For treatment visits, patients cannot have anyone with them due to current Covid guidelines and our immunocompromised population.   Platelet Transfusion A platelet transfusion is a procedure in which you receive donated platelets through an IV. Platelets are tiny pieces of blood cells. When you get an injury, platelets clump together in the area to form a blood clot. This helps stop bleeding and is the beginning of the healing  process. If you have too few platelets, your blood may have trouble clotting. This may cause you to bleedand bruise very easily. You may need a platelet transfusion if you have a condition that causes a low number of platelets (thrombocytopenia). A platelet transfusion may be used to stop or prevent excessive bleeding. Tell a health care provider  about: Any reactions you have had during previous transfusions. Any allergies you have. All medicines you are taking, including vitamins, herbs, eye drops, creams, and over-the-counter medicines. Any blood disorders you have. Any surgeries you have had. Any medical conditions you have. Whether you are pregnant or may be pregnant. What are the risks? Generally, this is a safe procedure. However, problems may occur, including: Fever. Infection. Allergic reaction to the donor platelets. Your body's disease-fighting system (immune system) attacking the donor platelets (hemolytic reaction). This is rare. A rare reaction that causes lung damage (transfusion-related acute lung injury). What happens before the procedure? Medicines Ask your health care provider about: Changing or stopping your regular medicines. This is especially important if you are taking diabetes medicines or blood thinners. Taking medicines such as aspirin and ibuprofen. These medicines can thin your blood. Do not take these medicines unless your health care provider tells you to take them. Taking over-the-counter medicines, vitamins, herbs, and supplements. General instructions You will have a blood test to determine your blood type. Your blood type determines what kind of platelets you will be given. Follow instructions from your health care provider about eating or drinking restrictions. If you have had an allergic reaction to a transfusion in the past, you may be given medicine to help prevent a reaction. Your temperature, blood pressure, pulse, and breathing will be monitored. What happens during the procedure?  An IV will be inserted into one of your veins. For your safety, two health care providers will verify your identity along with the donor platelets about to be infused. A bag of donor platelets will be connected to your IV. The platelets will flow into your bloodstream. This usually takes 30-60 minutes. Your  temperature, blood pressure, pulse, and breathing will be monitored during the transfusion. This helps detect early signs of any reaction. You will also be monitored for other symptoms that may indicate a reaction, including chills, hives, or itching. If you have signs of a reaction at any time, your transfusion will be stopped, and you may be given medicine to help manage the reaction. When your transfusion is complete, your IV will be removed. Pressure may be applied to the IV site for a few minutes to stop any bleeding. The IV site will be covered with a bandage (dressing). The procedure may vary among health care providers and hospitals. What happens after the procedure? Your blood pressure, temperature, pulse, and breathing will be monitored until you leave the hospital or clinic. You may have some bruising and soreness at your IV site. Follow these instructions at home: Medicines Take over-the-counter and prescription medicines only as told by your health care provider. Talk with your health care provider before you take any medicines that contain aspirin or NSAIDs. These medicines increase your risk for dangerous bleeding. General instructions Change or remove your dressing as told by your health care provider. Return to your normal activities as told by your health care provider. Ask your health care provider what activities are safe for you. Do not take baths, swim, or use a hot tub until your health care provider approves. Ask your health care provider if  you may take showers. Check your IV site every day for signs of infection. Check for: Redness, swelling, or pain. Fluid or blood. If fluid or blood drains from your IV site, use your hands to press down firmly on a bandage covering the area for a minute or two. Doing this should stop the bleeding. Warmth. Pus or a bad smell. Keep all follow-up visits as told by your health care provider. This is important. Contact a health care  provider if you have: A headache that does not go away with medicine. Hives, rash, or itchy skin. Nausea or vomiting. Unusual tiredness or weakness. Signs of infection at your IV site. Get help right away if: You have a fever or chills. You urinate less often than usual. Your urine is darker colored than normal. You have any of the following: Trouble breathing. Pain in your back, abdomen, or chest. Cool, clammy skin. A fast heartbeat. Summary Platelets are tiny pieces of blood cells that clump together to form a blood clot when you have an injury. If you have too few platelets, your blood may have trouble clotting. A platelet transfusion is a procedure in which you receive donated platelets through an IV. A platelet transfusion may be used to stop or prevent excessive bleeding. After the procedure, check your IV site every day for signs of infection, including redness, swelling, pain, or warmth. This information is not intended to replace advice given to you by your health care provider. Make sure you discuss any questions you have with your healthcare provider. Document Revised: 07/05/2017 Document Reviewed: 07/05/2017 Elsevier Patient Education  [place].  Romiplostim injection What is this medication? ROMIPLOSTIM (roe mi PLOE stim) helps your body make more platelets. This medicine is used to treat low platelets caused by chronic idiopathic thrombocytopenic purpura (ITP) or a bone marrow syndrome caused by radiationsickness. This medicine may be used for other purposes; ask your health care provider orpharmacist if you have questions. COMMON BRAND NAME(S): Nplate What should I tell my care team before I take this medication? They need to know if you have any of these conditions: blood clots myelodysplastic syndrome an unusual or allergic reaction to romiplostim, mannitol, other medicines, foods, dyes, or preservatives pregnant or trying to get  pregnant breast-feeding How should I use this medication? This medicine is injected under the skin. It is given by a health care providerin a hospital or clinic setting. A special MedGuide will be given to you before each treatment. Be sure to readthis information carefully each time. Talk to your health care provider about the use of this medicine in children. While it may be prescribed for children as young as newborns for selectedconditions, precautions do apply. Overdosage: If you think you have taken too much of this medicine contact apoison control center or emergency room at once. NOTE: This medicine is only for you. Do not share this medicine with others. What if I miss a dose? Keep appointments for follow-up doses. It is important not to miss your dose.Call your health care provider if you are unable to keep an appointment. What may interact with this medication? Interactions are not expected. This list may not describe all possible interactions. Give your health care provider a list of all the medicines, herbs, non-prescription drugs, or dietary supplements you use. Also tell them if you smoke, drink alcohol, or use illegaldrugs. Some items may interact with your medicine. What should I watch for while using this medication? Visit your health care provider  for regular checks on your progress. You may need blood work done while you are taking this medicine. Your condition will be monitored carefully while you are receiving this medicine. It is important notto miss any appointments. What side effects may I notice from receiving this medication? Side effects that you should report to your doctor or health care professionalas soon as possible: allergic reactions (skin rash, itching or hives; swelling of the face, lips, or tongue) bleeding (bloody or black, tarry stools; red or dark brown urine; spitting up blood or brown material that looks like coffee grounds; red spots on the skin; unusual  bruising or bleeding from the eyes, gums, or nose) blood clot (chest pain; shortness of breath; pain, swelling, or warmth in the leg) stroke (changes in vision; confusion; trouble speaking or understanding; severe headaches; sudden numbness or weakness of the face, arm or leg; trouble walking; dizziness; loss of balance or coordination) Side effects that usually do not require medical attention (report to yourdoctor or health care professional if they continue or are bothersome): diarrhea dizziness headache joint pain muscle pain stomach pain trouble sleeping This list may not describe all possible side effects. Call your doctor for medical advice about side effects. You may report side effects to FDA at1-800-FDA-1088. Where should I keep my medication? This medicine is given in a hospital or clinic. It will not be stored at home. NOTE: This sheet is a summary. It may not cover all possible information. If you have questions about this medicine, talk to your doctor, pharmacist, orhealth care provider.  2022 Elsevier/Gold Standard (2019-07-15 10:28:13)

## 2021-02-05 ENCOUNTER — Encounter: Payer: Self-pay | Admitting: Hematology

## 2021-02-05 LAB — PREPARE PLATELET PHERESIS: Unit division: 0

## 2021-02-05 LAB — BPAM PLATELET PHERESIS
Blood Product Expiration Date: 202208272359
ISSUE DATE / TIME: 202208251032
Unit Type and Rh: 9500

## 2021-02-08 ENCOUNTER — Telehealth: Payer: Self-pay | Admitting: Hematology

## 2021-02-08 ENCOUNTER — Other Ambulatory Visit: Payer: Medicare HMO

## 2021-02-08 ENCOUNTER — Ambulatory Visit: Payer: Medicare HMO

## 2021-02-08 NOTE — Telephone Encounter (Signed)
Scheduled appointment per 08/27 sch msg. Attempted to contact patient several times. Unable to leave message. Will continue to call.

## 2021-02-11 ENCOUNTER — Telehealth: Payer: Self-pay

## 2021-02-11 ENCOUNTER — Other Ambulatory Visit: Payer: Self-pay

## 2021-02-11 ENCOUNTER — Inpatient Hospital Stay: Payer: Medicare HMO

## 2021-02-11 ENCOUNTER — Inpatient Hospital Stay: Payer: Medicare HMO | Attending: Hematology

## 2021-02-11 ENCOUNTER — Inpatient Hospital Stay (HOSPITAL_BASED_OUTPATIENT_CLINIC_OR_DEPARTMENT_OTHER): Payer: Medicare HMO | Admitting: Hematology

## 2021-02-11 ENCOUNTER — Encounter: Payer: Self-pay | Admitting: Hematology

## 2021-02-11 VITALS — BP 124/65 | HR 94 | Temp 98.6°F | Resp 18 | Ht 63.0 in | Wt 156.7 lb

## 2021-02-11 DIAGNOSIS — C9310 Chronic myelomonocytic leukemia not having achieved remission: Secondary | ICD-10-CM | POA: Insufficient documentation

## 2021-02-11 DIAGNOSIS — Z5111 Encounter for antineoplastic chemotherapy: Secondary | ICD-10-CM | POA: Diagnosis not present

## 2021-02-11 DIAGNOSIS — I10 Essential (primary) hypertension: Secondary | ICD-10-CM | POA: Insufficient documentation

## 2021-02-11 DIAGNOSIS — D693 Immune thrombocytopenic purpura: Secondary | ICD-10-CM | POA: Diagnosis present

## 2021-02-11 DIAGNOSIS — D696 Thrombocytopenia, unspecified: Secondary | ICD-10-CM

## 2021-02-11 DIAGNOSIS — Z7984 Long term (current) use of oral hypoglycemic drugs: Secondary | ICD-10-CM | POA: Diagnosis not present

## 2021-02-11 DIAGNOSIS — E538 Deficiency of other specified B group vitamins: Secondary | ICD-10-CM | POA: Insufficient documentation

## 2021-02-11 DIAGNOSIS — M4724 Other spondylosis with radiculopathy, thoracic region: Secondary | ICD-10-CM | POA: Diagnosis not present

## 2021-02-11 DIAGNOSIS — Z5112 Encounter for antineoplastic immunotherapy: Secondary | ICD-10-CM | POA: Diagnosis present

## 2021-02-11 DIAGNOSIS — Z95828 Presence of other vascular implants and grafts: Secondary | ICD-10-CM

## 2021-02-11 DIAGNOSIS — R519 Headache, unspecified: Secondary | ICD-10-CM | POA: Diagnosis not present

## 2021-02-11 DIAGNOSIS — D649 Anemia, unspecified: Secondary | ICD-10-CM | POA: Insufficient documentation

## 2021-02-11 DIAGNOSIS — E1142 Type 2 diabetes mellitus with diabetic polyneuropathy: Secondary | ICD-10-CM | POA: Diagnosis not present

## 2021-02-11 DIAGNOSIS — Z79899 Other long term (current) drug therapy: Secondary | ICD-10-CM | POA: Diagnosis not present

## 2021-02-11 DIAGNOSIS — R1012 Left upper quadrant pain: Secondary | ICD-10-CM | POA: Diagnosis not present

## 2021-02-11 LAB — CBC WITH DIFFERENTIAL (CANCER CENTER ONLY)
Abs Immature Granulocytes: 1.06 10*3/uL — ABNORMAL HIGH (ref 0.00–0.07)
Basophils Absolute: 0.1 10*3/uL (ref 0.0–0.1)
Basophils Relative: 1 %
Eosinophils Absolute: 0.1 10*3/uL (ref 0.0–0.5)
Eosinophils Relative: 1 %
HCT: 27.9 % — ABNORMAL LOW (ref 36.0–46.0)
Hemoglobin: 9 g/dL — ABNORMAL LOW (ref 12.0–15.0)
Immature Granulocytes: 9 %
Lymphocytes Relative: 10 %
Lymphs Abs: 1.2 10*3/uL (ref 0.7–4.0)
MCH: 28.2 pg (ref 26.0–34.0)
MCHC: 32.3 g/dL (ref 30.0–36.0)
MCV: 87.5 fL (ref 80.0–100.0)
Monocytes Absolute: 1.5 10*3/uL — ABNORMAL HIGH (ref 0.1–1.0)
Monocytes Relative: 12 %
Neutro Abs: 8.3 10*3/uL — ABNORMAL HIGH (ref 1.7–7.7)
Neutrophils Relative %: 67 %
Platelet Count: <5 10*3/uL — CL (ref 150–400)
RBC: 3.19 MIL/uL — ABNORMAL LOW (ref 3.87–5.11)
RDW: 18.3 % — ABNORMAL HIGH (ref 11.5–15.5)
WBC Count: 12.2 10*3/uL — ABNORMAL HIGH (ref 4.0–10.5)
nRBC: 0 % (ref 0.0–0.2)

## 2021-02-11 LAB — CMP (CANCER CENTER ONLY)
ALT: 16 U/L (ref 0–44)
AST: 16 U/L (ref 15–41)
Albumin: 3.3 g/dL — ABNORMAL LOW (ref 3.5–5.0)
Alkaline Phosphatase: 137 U/L — ABNORMAL HIGH (ref 38–126)
Anion gap: 11 (ref 5–15)
BUN: 16 mg/dL (ref 8–23)
CO2: 23 mmol/L (ref 22–32)
Calcium: 8.9 mg/dL (ref 8.9–10.3)
Chloride: 105 mmol/L (ref 98–111)
Creatinine: 0.83 mg/dL (ref 0.44–1.00)
GFR, Estimated: >60 mL/min (ref >60)
Glucose, Bld: 296 mg/dL — ABNORMAL HIGH (ref 70–99)
Potassium: 4.3 mmol/L (ref 3.5–5.1)
Sodium: 139 mmol/L (ref 135–145)
Total Bilirubin: 0.7 mg/dL (ref 0.3–1.2)
Total Protein: 7.5 g/dL (ref 6.5–8.1)

## 2021-02-11 MED ORDER — SODIUM CHLORIDE 0.9% IV SOLUTION
250.0000 mL | Freq: Once | INTRAVENOUS | Status: AC
  Administered 2021-02-11: 250 mL via INTRAVENOUS

## 2021-02-11 MED ORDER — SODIUM CHLORIDE 0.9% FLUSH
10.0000 mL | INTRAVENOUS | Status: AC | PRN
  Administered 2021-02-11: 10 mL

## 2021-02-11 MED ORDER — SODIUM CHLORIDE 0.9% FLUSH
3.0000 mL | INTRAVENOUS | Status: AC | PRN
  Administered 2021-02-11: 3 mL

## 2021-02-11 MED ORDER — HEPARIN SOD (PORK) LOCK FLUSH 100 UNIT/ML IV SOLN
250.0000 [IU] | INTRAVENOUS | Status: AC | PRN
  Administered 2021-02-11: 250 [IU]

## 2021-02-11 NOTE — Patient Instructions (Signed)
Platelet Transfusion A platelet transfusion is a procedure in which you receive donated platelets through an IV. Platelets are tiny pieces of blood cells. When you get an injury, platelets clump together in the area to form a blood clot. This helps stop bleeding and is the beginning of the healing process. If you have too few platelets, your blood may have trouble clotting. This may cause you to bleed and bruise very easily. You may need a platelet transfusion if you have a condition that causes a low number of platelets (thrombocytopenia). A platelet transfusion may be used to stop or prevent excessive bleeding. Tell a health care provider about: Any reactions you have had during previous transfusions. Any allergies you have. All medicines you are taking, including vitamins, herbs, eye drops, creams, and over-the-counter medicines. Any blood disorders you have. Any surgeries you have had. Any medical conditions you have. Whether you are pregnant or may be pregnant. What are the risks? Generally, this is a safe procedure. However, problems may occur, including: Fever. Infection. Allergic reaction to the donor platelets. Your body's disease-fighting system (immune system) attacking the donor platelets (hemolytic reaction). This is rare. A rare reaction that causes lung damage (transfusion-related acute lung injury). What happens before the procedure? Medicines Ask your health care provider about: Changing or stopping your regular medicines. This is especially important if you are taking diabetes medicines or blood thinners. Taking medicines such as aspirin and ibuprofen. These medicines can thin your blood. Do not take these medicines unless your health care provider tells you to take them. Taking over-the-counter medicines, vitamins, herbs, and supplements. General instructions You will have a blood test to determine your blood type. Your blood type determines what kind of platelets you will  be given. Follow instructions from your health care provider about eating or drinking restrictions. If you have had an allergic reaction to a transfusion in the past, you may be given medicine to help prevent a reaction. Your temperature, blood pressure, pulse, and breathing will be monitored. What happens during the procedure?  An IV will be inserted into one of your veins. For your safety, two health care providers will verify your identity along with the donor platelets about to be infused. A bag of donor platelets will be connected to your IV. The platelets will flow into your bloodstream. This usually takes 30-60 minutes. Your temperature, blood pressure, pulse, and breathing will be monitored during the transfusion. This helps detect early signs of any reaction. You will also be monitored for other symptoms that may indicate a reaction, including chills, hives, or itching. If you have signs of a reaction at any time, your transfusion will be stopped, and you may be given medicine to help manage the reaction. When your transfusion is complete, your IV will be removed. Pressure may be applied to the IV site for a few minutes to stop any bleeding. The IV site will be covered with a bandage (dressing). The procedure may vary among health care providers and hospitals. What happens after the procedure? Your blood pressure, temperature, pulse, and breathing will be monitored until you leave the hospital or clinic. You may have some bruising and soreness at your IV site. Follow these instructions at home: Medicines Take over-the-counter and prescription medicines only as told by your health care provider. Talk with your health care provider before you take any medicines that contain aspirin or NSAIDs. These medicines increase your risk for dangerous bleeding. General instructions Change or remove your dressing as   told by your health care provider. Return to your normal activities as told by  your health care provider. Ask your health care provider what activities are safe for you. Do not take baths, swim, or use a hot tub until your health care provider approves. Ask your health care provider if you may take showers. Check your IV site every day for signs of infection. Check for: Redness, swelling, or pain. Fluid or blood. If fluid or blood drains from your IV site, use your hands to press down firmly on a bandage covering the area for a minute or two. Doing this should stop the bleeding. Warmth. Pus or a bad smell. Keep all follow-up visits as told by your health care provider. This is important. Contact a health care provider if you have: A headache that does not go away with medicine. Hives, rash, or itchy skin. Nausea or vomiting. Unusual tiredness or weakness. Signs of infection at your IV site. Get help right away if: You have a fever or chills. You urinate less often than usual. Your urine is darker colored than normal. You have any of the following: Trouble breathing. Pain in your back, abdomen, or chest. Cool, clammy skin. A fast heartbeat. Summary Platelets are tiny pieces of blood cells that clump together to form a blood clot when you have an injury. If you have too few platelets, your blood may have trouble clotting. A platelet transfusion is a procedure in which you receive donated platelets through an IV. A platelet transfusion may be used to stop or prevent excessive bleeding. After the procedure, check your IV site every day for signs of infection, including redness, swelling, pain, or warmth. This information is not intended to replace advice given to you by your health care provider. Make sure you discuss any questions you have with your health care provider. Document Revised: 09/08/2020 Document Reviewed: 07/05/2017 Elsevier Patient Education  2022 [employer]  

## 2021-02-11 NOTE — Progress Notes (Signed)
[facility]   [phone] [phone]   Clinic Follow up Note   Patient Care Team: [name], DO as PCP - General (Family Medicine) Loletha Carrow Kirke Corin, MD as Consulting Physician (Gastroenterology) Truitt Merle, MD as Consulting Physician (Hematology) Alda Berthold, DO as Consulting Physician (Neurology)  Date of Service:  02/11/2021  CHIEF COMPLAINT: f/u of [name], severe thrombocytopenia  CURRENT THERAPY:  Azacitidine days 1-5 q28 days, started 12/28/20 Nplate 20mg/kg weekly  Platelet transfusion as needed (plt<20K)   ASSESSMENT & PLAN:  Kathleen BRYANDis a 70y.o. female with   1. [name] with severe thrombocytopenia  -She was initially diagnosed with ITP in 2014. She lost follow up after 11/2017 and did not proceed with recommended bone marrow biopsy. -She was referred to ED 10/28/20 by her PCP for low plt count of 4k. She was hospitalized and treated with platelet transfusions, IVIG, oral prednisone 647mand Nplate injections. She tapered off prednisone given little response. -Bone marrow biopsy 10/30/20 felt to likely represent MDS/MPN, particularly [name]. [name] PaLinus OrnShe received treatment for severe refractory thrombocytopenia with weekly Rituxan x4 on 11/19/20, but she did not respond  -She began azacitidine daily days 1-5 q. 28 days on 12/28/20, s/p 2 cycles  -She had worsening thrombocytopenia after stopping Nplate, restarted weekly Nplate 10 mcg/kg on 12/11/13/7262Her thrombocytopenia did improve after first cycle chemo, but it dropped significantly after second cycle -Labs reviewed, plt <5 today, will proceed with plt transfusion    2. Normocytic anemia, moderate  -h/o pernicious anemia/vitamin B12 deficiency -with recent severe thrombocytopenia, she has had loq ferritin and iron study with moderate anemia. She required blood transfusion in hospital. Will give blood  transfusion for Hg <=7.5.  -Hg 9.0 today (02/11/21)   3. Type 2 diabetes mellitus with polyneuropathy, Hypertension -Continue medication, she will follow-up with her family doctor   4. Left UQ abdominal pain, back pain -abdominal pain likely secondary to splenomegaly. Currently tolerable, continue monitoring. -back pain began in early 01/2021. Occurs mostly when she stands up. She is taking tylenol as needed.   5. Headache -no other neurological symptoms, will continue monitor clinically, take Tylenol as needed -not mentioned today (02/11/21)     PLAN:  -proceed with platelet transfusion today -C3 Azacitidine fusion on week of 02/22/21 (day 1-5 every 28 days) -labs and Nplate weekly  -f/u on 9/12     No problem-specific Assessment & Plan notes found for this encounter.   SUMMARY OF ONCOLOGIC HISTORY: Oncology History  [name] ([place] Imaging   CT CAP  IMPRESSION: 1. Splenomegaly with pathologically enlarged lymph nodes above and below the diaphragm, with overall stable to minimally increased abdominal adenopathy and interval progression of the pelvic adenopathy. 2. Small volume abdominopelvic ascites with diffuse mesenteric stranding. 3. Scattered bilateral pulmonary micro nodules measuring 1-2 mm. 4. Distended gallbladder with some layering hyperdense material representing layering sludge and tiny stones seen on prior ultrasound. 5. Aortic atherosclerosis.   10/30/2020 Pathology Results   DIAGNOSIS:   BONE MARROW, ASPIRATE, CLOT, CORE:  -  Hypercellular bone marrow with panhyperplasia, atypia, and no  increase in blasts  -  See comment   PERIPHERAL BLOOD:  -  Marked thrombocytopenia  -  Absolute monocytosis  -  Normocytic anemia  -  See CBC data and comment   COMMENT:  The bone marrow is hypercellular for age (  approximately 80%) with myeloid hyperplasia with maturational left shift, erythroid hyperplasia, and increased  megakaryocytes.  Mild multilineage atypia is present. Blasts are not increased on aspirate smears (1% by manual differential count) or by CD34 immunohistochemistry on the core biopsy.  Concurrent flow cytometric analysis of the bone marrow aspirate demonstrates increased monocytes, and no increase in blasts or abnormal lymphoid population (see [id number]).  Monocytes are also increased in peripheral  blood, persistent per electronic medical record.  In aggregate, the  findings raise the possibility of a myeloid neoplasm with the  differential diagnosis including a low-grade myelodysplastic syndrome and chronic myelomonocytic leukemia (dysplastic type).    ADDENDUM:  A reticulin special stain performed on the bone marrow core biopsy reveals no significant increase in reticulin fibrosis.  ADDENDUM:  CYTOGENETIC RESULTS:  Karyotype: 46,XX[20]  Interpretation: NORMAL FEMALE KARYOTYPE   FISH RESULTS:  Results: NORMAL   ADDENDUM:  CD123 immunohistochemistry performed on the core biopsy highlights scattered aggregates of positively staining cells consistent with plasmacytoid dendritic cells.    10/30/2020 Pathology Results   DIAGNOSIS:   BONE MARROW; FLOW CYTOMETRIC ANALYSIS:  -  Increased monocytes  -  Scant B-cells present  -  No immunophenotypically aberrant T-cell population identified  -  No increase in blasts  -  See comment   COMMENT:  Monocytes are relatively increased (12% of all cells), without aberrant expression of CD56.  B-cells comprise <1% of total lymphocytes. CD34-positive blasts are not increased (<1% of all cells).  Correlation with concurrent morphology is recommended for complete diagnostic interpretation and overall blast enumeration (see [id number]).    10/30/2020 Pathology Results   FINAL MICROSCOPIC DIAGNOSIS:   A. LYMPH NODE, RIGHT AXILLARY, NEEDLE CORE BIOPSY:  -Lymphoid tissue present  -See comment   COMMENT:  The sections show several small needle core  biopsy fragments of lymphoid tissue displaying degenerative cellular changes/necrosis and hence cannot be accurately evaluated.  Sample for flow cytometric analysis not available.  Immunohistochemical stain for CD3 and CD20 were performed with appropriate controls.  There is a mixture of T and B cells in their apparently respective compartments.  There is no definite metastatic malignancy.    11/11/2020 Pathology Results   DIAGNOSIS:   LEFT AXILLARY LYMPH NODE EXCISIONAL BIOPSY; FLOW CYTOMETRIC ANALYSIS:  -  No monotypic B-cell or immunophenotypically aberrant T-cell  population identified  -  See comment   COMMENT:  Flow cytometric analysis identifies B-cells with a normal kappa:lambda ratio of 1.7:1.  A subset of the polytypic B-cells expresses CD10. T-cells show a CD4:CD8 ratio of 3.3:1 without immunophenotypic aberrancy with the markers evaluated.  Although these results do not support the diagnosis of a clonal lymphoid population, sampling issues must always be considered when negative results are obtained, as focal lesions may not be represented in the specimen submitted.  In addition, flow cytometric immunophenotyping will not exclude other pathology if present (e.g. Hodgkin lymphoma, some T-cell lymphomas, metastatic and infectious diseases).   11/11/2020 Pathology Results   FINAL MICROSCOPIC DIAGNOSIS:   A. LYMPH NODE, LEFT AXILLARY, DISSECTION:  -  Follicular hyperplasia with interfollicular expansion  -  See comment   COMMENT:  Sections of the lymph nodes reveal generally preserved lymph node architecture.  There is follicular hyperplasia with some follicles showing increased hyalinization of germinal centers and mild concentric encircling of germinal centers with small lymphocytes (onion skinning). Interfollicular areas are expanded with increased vascular proliferation, small lymphocytes, plasma cells, histiocytic cells, and patchy neutrophils.  The lymph node capsule is  variably  thickened by fibrosis with few plasma cells.   A panel of immunohistochemical stains is performed for further  characterization.  CD3 and CD20/PAX5 highlight T-cell and B-cell  compartments, respectively.  Germinal centers are highlighted by CD10 and BCL6 with appropriate absent expression of BCL2.  CD5 is similar to CD3.  CD43 also stains the T-cells.  CD4-positive T-cells exceed CD8-positive T-cells.  CD30 highlights scattered immunoblasts.  CD21 reveals intact follicular dendritic cell meshworks.  CD68 highlights increased histiocytic cells.  HHV 8 is negative.  T. pallidum reveals no definitive organisms.  Pancytokeratin is negative.  TdT stains rare cells.  CD138 highlights plasma cells which are not increased in number and show polytypic light chain expression by kappa/lambda in situ  hybridization.  EBV is negative by in situ hybridization.   Concurrent flow cytometric analysis is negative for a monoclonal B-cell or immunophenotypically aberrant T-cell population (see [phone]).   Together, the findings above are non-specific and can be seen in  reactive and infectious processes as well as Castleman disease.  There is no definitive morphologic or flow cytometric evidence of involvement by a lymphoproliferative disorder from the current workup.   12/17/2020 Initial Diagnosis   [name] (chronic myelomonocytic leukemia) ([place])   12/28/2020 -  Chemotherapy    Patient is on Treatment Plan: MYELODYSPLASIA  AZACITIDINE IV D1-5 Q28D          INTERVAL HISTORY:  Kathleen Gibson is here for a follow up of [name], severe thrombocytopenia. She was last seen by me on 01/18/21. She presents to the clinic alone. She reports continued back pain. She notes she can bend over but has trouble standing back up.   All other systems were reviewed with the patient and are negative.  MEDICAL HISTORY:  Past Medical History:  Diagnosis Date   Diabetes ([place])    HLD (hyperlipidemia)    Hypertension    Pernicious  anemia    Pneumonia 09/05/2019   Renal insufficiency 09/06/2019   Vitamin D deficiency     SURGICAL HISTORY: Past Surgical History:  Procedure Laterality Date   ABDOMINAL HERNIA REPAIR  2009   ABDOMINOPLASTY     AXILLARY LYMPH NODE BIOPSY Left 11/11/2020   Procedure: LEFT AXILLARY EXCISIONAL LYMPH NODE BIOPSY;  Surgeon: [name], MD;  Location: [facility];  Service: General;  Laterality: Left;   [place]   hemorroid surgery  2007   [place]    I have reviewed the social history and family history with the patient and they are unchanged from previous note.  ALLERGIES:  is allergic to asa [aspirin] and nsaids.  MEDICATIONS:  Current Outpatient Medications  Medication Sig Dispense Refill   cyclobenzaprine (FLEXERIL) 5 MG tablet Take 1 tablet (5 mg total) by mouth every 8 (eight) hours as needed for muscle spasms. 30 tablet 0   ferrous sulfate 325 (65 FE) MG tablet Take 2 tablets (650 mg total) by mouth daily with breakfast. 180 tablet 3   gabapentin (NEURONTIN) 300 MG capsule Take 2 capsules (600 mg total) by mouth 2 (two) times daily. 360 capsule 1   glucose monitoring kit (FREESTYLE) monitoring kit 1 each by Does not apply route as needed for other. Use as directed 1 each 0   metoprolol succinate (TOPROL-XL) 50 MG 24 hr tablet Take 1 tablet (50 mg total) by mouth daily. 90 tablet 1   sitaGLIPtin-metformin (JANUMET) 50-1000 MG tablet Take 1 tablet by mouth  at bedtime. 90 tablet 1   Vitamin D, Cholecalciferol, 25 MCG (1000 UT) CAPS Take 1,000 Units by mouth daily.      zolpidem (AMBIEN) 5 MG tablet Take 1 tablet (5 mg total) by mouth at bedtime. 90 tablet 1   No current facility-administered medications for this visit.    PHYSICAL EXAMINATION: ECOG PERFORMANCE STATUS: 1 - Symptomatic but completely ambulatory  Vitals:   02/11/21 1317  BP: 124/65  Pulse: 94  Resp: 18  Temp: 98.6 F (37 C)  SpO2: 99%   Wt  Readings from Last 3 Encounters:  02/11/21 156 lb 11.2 oz (71.1 kg)  01/29/21 164 lb 12.8 oz (74.8 kg)  01/28/21 166 lb (75.3 kg)     GENERAL:alert, no distress and comfortable SKIN: skin color normal, no rashes or significant lesions EYES: normal, Conjunctiva are pink and non-injected, sclera clear  NEURO: alert & oriented x 3 with fluent speech  LABORATORY DATA:  I have reviewed the data as listed CBC Latest Ref Rng & Units 02/11/2021 02/04/2021 01/28/2021  WBC 4.0 - 10.5 K/uL 12.2(H) 13.7(H) 8.2  Hemoglobin 12.0 - 15.0 g/dL 9.0(L) 9.4(L) 8.6(L)  Hematocrit 36.0 - 46.0 % 27.9(L) 30.0(L) 27.1(L)  Platelets 150 - 400 K/uL <5(LL) 11(L) 33(L)     CMP Latest Ref Rng & Units 02/11/2021 02/04/2021 01/28/2021  Glucose 70 - 99 mg/dL 296(H) 262(H) 284(H)  BUN 8 - 23 mg/dL _0 Creatinine 0.44 - 1.00 mg/dL 0.83 0.77 1.01(H)  Sodium 135 - 145 mmol/L 139 136 134(L)  Potassium 3.5 - 5.1 mmol/L 4.3 4.3 4.0  Chloride 98 - 111 mmol/L 105 104 101  CO2 22 - 32 mmol/L _1 Calcium 8.9 - 10.3 mg/dL 8.9 8.8(L) 9.0  Total Protein 6.5 - 8.1 g/dL 7.5 7.6 7.7  Total Bilirubin 0.3 - 1.2 mg/dL 0.7 0.6 0.7  Alkaline Phos 38 - 126 U/L 137(H) 118 114  AST 15 - 41 U/L 16 14(L) 20  ALT 0 - 44 U/L _2 RADIOGRAPHIC STUDIES: I have personally reviewed the radiological images as listed and agreed with the findings in the report. No results found.    No orders of the defined types were placed in this encounter.  All questions were answered. The patient knows to call the clinic with any problems, questions or concerns. No barriers to learning was detected. The total time spent in the appointment was 30 minutes.     Truitt Merle, MD 02/11/2021   I, Wilburn Mylar, am acting as scribe for Truitt Merle, MD.   I have reviewed the above documentation for accuracy and completeness, and I agree with the above.

## 2021-02-11 NOTE — Telephone Encounter (Signed)
CRITICAL VALUE STICKER  CRITICAL VALUE: Platelets <5  RECEIVER (on-site recipient of call): Yetta Glassman, [place] NOTIFIED: 02/11/21 at 1:12p  MESSENGER (representative from lab): Pam  MD NOTIFIED: Dr. Burr Medico  TIME OF NOTIFICATION: 02/11/21 at 1:15p  RESPONSE: Notification given directly ro Dr. Burr Medico who is seeing the pt.

## 2021-02-12 ENCOUNTER — Telehealth: Payer: Self-pay

## 2021-02-12 ENCOUNTER — Inpatient Hospital Stay: Payer: Medicare HMO

## 2021-02-12 ENCOUNTER — Other Ambulatory Visit: Payer: Self-pay

## 2021-02-12 ENCOUNTER — Ambulatory Visit: Payer: Medicare HMO

## 2021-02-12 VITALS — BP 116/66 | HR 90 | Resp 18

## 2021-02-12 DIAGNOSIS — Z79899 Other long term (current) drug therapy: Secondary | ICD-10-CM | POA: Diagnosis not present

## 2021-02-12 DIAGNOSIS — I10 Essential (primary) hypertension: Secondary | ICD-10-CM | POA: Diagnosis not present

## 2021-02-12 DIAGNOSIS — D649 Anemia, unspecified: Secondary | ICD-10-CM | POA: Diagnosis not present

## 2021-02-12 DIAGNOSIS — E538 Deficiency of other specified B group vitamins: Secondary | ICD-10-CM | POA: Diagnosis not present

## 2021-02-12 DIAGNOSIS — C9310 Chronic myelomonocytic leukemia not having achieved remission: Secondary | ICD-10-CM

## 2021-02-12 DIAGNOSIS — Z452 Encounter for adjustment and management of vascular access device: Secondary | ICD-10-CM

## 2021-02-12 DIAGNOSIS — Z5112 Encounter for antineoplastic immunotherapy: Secondary | ICD-10-CM | POA: Diagnosis not present

## 2021-02-12 DIAGNOSIS — D693 Immune thrombocytopenic purpura: Secondary | ICD-10-CM | POA: Diagnosis not present

## 2021-02-12 DIAGNOSIS — Z7984 Long term (current) use of oral hypoglycemic drugs: Secondary | ICD-10-CM | POA: Diagnosis not present

## 2021-02-12 DIAGNOSIS — D696 Thrombocytopenia, unspecified: Secondary | ICD-10-CM

## 2021-02-12 DIAGNOSIS — Z5111 Encounter for antineoplastic chemotherapy: Secondary | ICD-10-CM | POA: Diagnosis not present

## 2021-02-12 DIAGNOSIS — R1012 Left upper quadrant pain: Secondary | ICD-10-CM | POA: Diagnosis not present

## 2021-02-12 DIAGNOSIS — M4724 Other spondylosis with radiculopathy, thoracic region: Secondary | ICD-10-CM | POA: Diagnosis not present

## 2021-02-12 DIAGNOSIS — E1142 Type 2 diabetes mellitus with diabetic polyneuropathy: Secondary | ICD-10-CM | POA: Diagnosis not present

## 2021-02-12 DIAGNOSIS — R519 Headache, unspecified: Secondary | ICD-10-CM | POA: Diagnosis not present

## 2021-02-12 LAB — CBC WITH DIFFERENTIAL (CANCER CENTER ONLY)
Abs Immature Granulocytes: 0.84 10*3/uL — ABNORMAL HIGH (ref 0.00–0.07)
Basophils Absolute: 0.1 10*3/uL (ref 0.0–0.1)
Basophils Relative: 0 %
Eosinophils Absolute: 0 10*3/uL (ref 0.0–0.5)
Eosinophils Relative: 0 %
HCT: 28.3 % — ABNORMAL LOW (ref 36.0–46.0)
Hemoglobin: 8.9 g/dL — ABNORMAL LOW (ref 12.0–15.0)
Immature Granulocytes: 7 %
Lymphocytes Relative: 7 %
Lymphs Abs: 0.8 10*3/uL (ref 0.7–4.0)
MCH: 27.6 pg (ref 26.0–34.0)
MCHC: 31.4 g/dL (ref 30.0–36.0)
MCV: 87.9 fL (ref 80.0–100.0)
Monocytes Absolute: 1.8 10*3/uL — ABNORMAL HIGH (ref 0.1–1.0)
Monocytes Relative: 14 %
Neutro Abs: 8.8 10*3/uL — ABNORMAL HIGH (ref 1.7–7.7)
Neutrophils Relative %: 72 %
Platelet Count: <5 10*3/uL — CL (ref 150–400)
RBC: 3.22 MIL/uL — ABNORMAL LOW (ref 3.87–5.11)
RDW: 18.3 % — ABNORMAL HIGH (ref 11.5–15.5)
WBC Count: 12.4 10*3/uL — ABNORMAL HIGH (ref 4.0–10.5)
nRBC: 0 % (ref 0.0–0.2)

## 2021-02-12 LAB — BPAM PLATELET PHERESIS
Blood Product Expiration Date: 202209012359
ISSUE DATE / TIME: 202209011627
Unit Type and Rh: 5100

## 2021-02-12 LAB — PREPARE PLATELET PHERESIS: Unit division: 0

## 2021-02-12 MED ORDER — SODIUM CHLORIDE 0.9% FLUSH
3.0000 mL | INTRAVENOUS | Status: AC | PRN
  Administered 2021-02-12: 3 mL

## 2021-02-12 MED ORDER — ROMIPLOSTIM INJECTION 500 MCG
10.0000 ug/kg | Freq: Once | SUBCUTANEOUS | Status: AC
  Administered 2021-02-12: 710 ug via SUBCUTANEOUS
  Filled 2021-02-12: qty 1

## 2021-02-12 MED ORDER — HEPARIN SOD (PORK) LOCK FLUSH 100 UNIT/ML IV SOLN
250.0000 [IU] | INTRAVENOUS | Status: AC | PRN
  Administered 2021-02-12: 250 [IU]

## 2021-02-12 MED ORDER — SODIUM CHLORIDE 0.9% IV SOLUTION
250.0000 mL | Freq: Once | INTRAVENOUS | Status: AC
  Administered 2021-02-12: 250 mL via INTRAVENOUS

## 2021-02-12 MED ORDER — SODIUM CHLORIDE 0.9% FLUSH
10.0000 mL | INTRAVENOUS | Status: AC | PRN
  Administered 2021-02-12: 10 mL

## 2021-02-12 MED ORDER — SODIUM CHLORIDE 0.9% FLUSH: 10.0000 mL | INTRAVENOUS | Status: DC | PRN

## 2021-02-12 NOTE — Telephone Encounter (Signed)
CRITICAL VALUE STICKER  CRITICAL VALUE: PLATELETS <5  RECEIVER (on-site recipient of call):Kathleen Gibson   DATE & TIME NOTIFIED: 02/12/21 8:52 am  MESSENGER (representative from lab): Pam   MD NOTIFIED: Dr Burr Medico  TIME OF NOTIFICATION: 8:52 am

## 2021-02-12 NOTE — Patient Instructions (Signed)
Platelet Transfusion A platelet transfusion is a procedure in which you receive donated platelets through an IV. Platelets are tiny pieces of blood cells. When you get an injury, platelets clump together in the area to form a blood clot. This helps stop bleeding and is the beginning of the healing process. If you have too few platelets, your blood may have trouble clotting. This may cause you to bleed and bruise very easily. You may need a platelet transfusion if you have a condition that causes a low number of platelets (thrombocytopenia). A platelet transfusion may be used to stop or prevent excessive bleeding. Tell a health care provider about: Any reactions you have had during previous transfusions. Any allergies you have. All medicines you are taking, including vitamins, herbs, eye drops, creams, and over-the-counter medicines. Any blood disorders you have. Any surgeries you have had. Any medical conditions you have. Whether you are pregnant or may be pregnant. What are the risks? Generally, this is a safe procedure. However, problems may occur, including: Fever. Infection. Allergic reaction to the donor platelets. Your body's disease-fighting system (immune system) attacking the donor platelets (hemolytic reaction). This is rare. A rare reaction that causes lung damage (transfusion-related acute lung injury). What happens before the procedure? Medicines Ask your health care provider about: Changing or stopping your regular medicines. This is especially important if you are taking diabetes medicines or blood thinners. Taking medicines such as aspirin and ibuprofen. These medicines can thin your blood. Do not take these medicines unless your health care provider tells you to take them. Taking over-the-counter medicines, vitamins, herbs, and supplements. General instructions You will have a blood test to determine your blood type. Your blood type determines what kind of platelets you will  be given. Follow instructions from your health care provider about eating or drinking restrictions. If you have had an allergic reaction to a transfusion in the past, you may be given medicine to help prevent a reaction. Your temperature, blood pressure, pulse, and breathing will be monitored. What happens during the procedure?  An IV will be inserted into one of your veins. For your safety, two health care providers will verify your identity along with the donor platelets about to be infused. A bag of donor platelets will be connected to your IV. The platelets will flow into your bloodstream. This usually takes 30-60 minutes. Your temperature, blood pressure, pulse, and breathing will be monitored during the transfusion. This helps detect early signs of any reaction. You will also be monitored for other symptoms that may indicate a reaction, including chills, hives, or itching. If you have signs of a reaction at any time, your transfusion will be stopped, and you may be given medicine to help manage the reaction. When your transfusion is complete, your IV will be removed. Pressure may be applied to the IV site for a few minutes to stop any bleeding. The IV site will be covered with a bandage (dressing). The procedure may vary among health care providers and hospitals. What happens after the procedure? Your blood pressure, temperature, pulse, and breathing will be monitored until you leave the hospital or clinic. You may have some bruising and soreness at your IV site. Follow these instructions at home: Medicines Take over-the-counter and prescription medicines only as told by your health care provider. Talk with your health care provider before you take any medicines that contain aspirin or NSAIDs. These medicines increase your risk for dangerous bleeding. General instructions Change or remove your dressing as   told by your health care provider. Return to your normal activities as told by  your health care provider. Ask your health care provider what activities are safe for you. Do not take baths, swim, or use a hot tub until your health care provider approves. Ask your health care provider if you may take showers. Check your IV site every day for signs of infection. Check for: Redness, swelling, or pain. Fluid or blood. If fluid or blood drains from your IV site, use your hands to press down firmly on a bandage covering the area for a minute or two. Doing this should stop the bleeding. Warmth. Pus or a bad smell. Keep all follow-up visits as told by your health care provider. This is important. Contact a health care provider if you have: A headache that does not go away with medicine. Hives, rash, or itchy skin. Nausea or vomiting. Unusual tiredness or weakness. Signs of infection at your IV site. Get help right away if: You have a fever or chills. You urinate less often than usual. Your urine is darker colored than normal. You have any of the following: Trouble breathing. Pain in your back, abdomen, or chest. Cool, clammy skin. A fast heartbeat. Summary Platelets are tiny pieces of blood cells that clump together to form a blood clot when you have an injury. If you have too few platelets, your blood may have trouble clotting. A platelet transfusion is a procedure in which you receive donated platelets through an IV. A platelet transfusion may be used to stop or prevent excessive bleeding. After the procedure, check your IV site every day for signs of infection, including redness, swelling, pain, or warmth. This information is not intended to replace advice given to you by your health care provider. Make sure you discuss any questions you have with your health care provider. Document Revised: 09/08/2020 Document Reviewed: 07/05/2017 Elsevier Patient Education  2022 [employer]  

## 2021-02-15 LAB — BPAM PLATELET PHERESIS
Blood Product Expiration Date: 202209052359
ISSUE DATE / TIME: 202209021051
Unit Type and Rh: 5100

## 2021-02-15 LAB — PREPARE PLATELET PHERESIS: Unit division: 0

## 2021-02-16 ENCOUNTER — Encounter: Payer: Self-pay | Admitting: Hematology

## 2021-02-16 ENCOUNTER — Ambulatory Visit: Payer: Medicare HMO

## 2021-02-16 ENCOUNTER — Other Ambulatory Visit: Payer: Medicare HMO

## 2021-02-17 ENCOUNTER — Telehealth: Payer: Self-pay

## 2021-02-17 ENCOUNTER — Other Ambulatory Visit: Payer: Self-pay

## 2021-02-17 ENCOUNTER — Inpatient Hospital Stay: Payer: Medicare HMO

## 2021-02-17 ENCOUNTER — Telehealth: Payer: Self-pay | Admitting: Hematology

## 2021-02-17 DIAGNOSIS — Z7984 Long term (current) use of oral hypoglycemic drugs: Secondary | ICD-10-CM | POA: Diagnosis not present

## 2021-02-17 DIAGNOSIS — E538 Deficiency of other specified B group vitamins: Secondary | ICD-10-CM | POA: Diagnosis not present

## 2021-02-17 DIAGNOSIS — C9310 Chronic myelomonocytic leukemia not having achieved remission: Secondary | ICD-10-CM

## 2021-02-17 DIAGNOSIS — Z452 Encounter for adjustment and management of vascular access device: Secondary | ICD-10-CM

## 2021-02-17 DIAGNOSIS — M4724 Other spondylosis with radiculopathy, thoracic region: Secondary | ICD-10-CM | POA: Diagnosis not present

## 2021-02-17 DIAGNOSIS — R519 Headache, unspecified: Secondary | ICD-10-CM | POA: Diagnosis not present

## 2021-02-17 DIAGNOSIS — R1012 Left upper quadrant pain: Secondary | ICD-10-CM | POA: Diagnosis not present

## 2021-02-17 DIAGNOSIS — D696 Thrombocytopenia, unspecified: Secondary | ICD-10-CM

## 2021-02-17 DIAGNOSIS — I10 Essential (primary) hypertension: Secondary | ICD-10-CM | POA: Diagnosis not present

## 2021-02-17 DIAGNOSIS — Z5111 Encounter for antineoplastic chemotherapy: Secondary | ICD-10-CM | POA: Diagnosis not present

## 2021-02-17 DIAGNOSIS — Z79899 Other long term (current) drug therapy: Secondary | ICD-10-CM | POA: Diagnosis not present

## 2021-02-17 DIAGNOSIS — D693 Immune thrombocytopenic purpura: Secondary | ICD-10-CM

## 2021-02-17 DIAGNOSIS — E1142 Type 2 diabetes mellitus with diabetic polyneuropathy: Secondary | ICD-10-CM | POA: Diagnosis not present

## 2021-02-17 DIAGNOSIS — Z5112 Encounter for antineoplastic immunotherapy: Secondary | ICD-10-CM | POA: Diagnosis not present

## 2021-02-17 DIAGNOSIS — D649 Anemia, unspecified: Secondary | ICD-10-CM | POA: Diagnosis not present

## 2021-02-17 LAB — CBC WITH DIFFERENTIAL (CANCER CENTER ONLY)
Abs Immature Granulocytes: 0.3 10*3/uL — ABNORMAL HIGH (ref 0.00–0.07)
Basophils Absolute: 0 10*3/uL (ref 0.0–0.1)
Basophils Relative: 0 %
Eosinophils Absolute: 0 10*3/uL (ref 0.0–0.5)
Eosinophils Relative: 0 %
HCT: 27 % — ABNORMAL LOW (ref 36.0–46.0)
Hemoglobin: 8.6 g/dL — ABNORMAL LOW (ref 12.0–15.0)
Immature Granulocytes: 3 %
Lymphocytes Relative: 7 %
Lymphs Abs: 0.7 10*3/uL (ref 0.7–4.0)
MCH: 27.8 pg (ref 26.0–34.0)
MCHC: 31.9 g/dL (ref 30.0–36.0)
MCV: 87.4 fL (ref 80.0–100.0)
Monocytes Absolute: 1.4 10*3/uL — ABNORMAL HIGH (ref 0.1–1.0)
Monocytes Relative: 16 %
Neutro Abs: 6.5 10*3/uL (ref 1.7–7.7)
Neutrophils Relative %: 74 %
Platelet Count: <5 10*3/uL — CL (ref 150–400)
RBC: 3.09 MIL/uL — ABNORMAL LOW (ref 3.87–5.11)
RDW: 18.6 % — ABNORMAL HIGH (ref 11.5–15.5)
WBC Count: 8.9 10*3/uL (ref 4.0–10.5)
nRBC: 0 % (ref 0.0–0.2)

## 2021-02-17 LAB — CMP (CANCER CENTER ONLY)
ALT: 14 U/L (ref 0–44)
AST: 14 U/L — ABNORMAL LOW (ref 15–41)
Albumin: 3.3 g/dL — ABNORMAL LOW (ref 3.5–5.0)
Alkaline Phosphatase: 137 U/L — ABNORMAL HIGH (ref 38–126)
Anion gap: 10 (ref 5–15)
BUN: 16 mg/dL (ref 8–23)
CO2: 22 mmol/L (ref 22–32)
Calcium: 8.7 mg/dL — ABNORMAL LOW (ref 8.9–10.3)
Chloride: 106 mmol/L (ref 98–111)
Creatinine: 0.95 mg/dL (ref 0.44–1.00)
GFR, Estimated: >60 mL/min (ref >60)
Glucose, Bld: 335 mg/dL — ABNORMAL HIGH (ref 70–99)
Potassium: 4.1 mmol/L (ref 3.5–5.1)
Sodium: 138 mmol/L (ref 135–145)
Total Bilirubin: 0.6 mg/dL (ref 0.3–1.2)
Total Protein: 7.5 g/dL (ref 6.5–8.1)

## 2021-02-17 LAB — SAMPLE TO BLOOD BANK

## 2021-02-17 MED ORDER — SODIUM CHLORIDE 0.9% FLUSH
10.0000 mL | INTRAVENOUS | Status: AC | PRN
  Administered 2021-02-17: 10 mL

## 2021-02-17 MED ORDER — HEPARIN SOD (PORK) LOCK FLUSH 100 UNIT/ML IV SOLN: 250.0000 [IU] | INTRAVENOUS | Status: DC | PRN

## 2021-02-17 MED ORDER — SODIUM CHLORIDE 0.9% IV SOLUTION
250.0000 mL | Freq: Once | INTRAVENOUS | Status: AC
  Administered 2021-02-17: 250 mL via INTRAVENOUS

## 2021-02-17 NOTE — Patient Instructions (Signed)
Thrombocytopenia Thrombocytopenia means that you have a low number of platelets in your blood. Platelets are tiny cells in the blood. When you bleed, they clump together at the cut or injury to stop the bleeding. This is called blood clotting. If you do not have enough platelets, it can cause bleeding problems. Some cases of this condition are mild while others are more severe. What are the causes? This condition may be caused by: Your body not making enough platelets. This may be caused by: Your bone marrow not making blood cells (aplastic anemia). Cancer in the bone marrow. Certain medicines. Infection in the bone marrow. Drinking a lot of alcohol. Your body destroying platelets too quickly. This may be caused by: Certain immune diseases. Certain medicines. Certain blood clotting disorders. Certain disorders that are passed from parent to child (inherited). Certain bleeding disorders. Pregnancy. Having a spleen that is larger than normal. What are the signs or symptoms? Bleeding that is not normal. Nosebleeds. Heavy menstrual periods. Blood in the pee (urine) or poop (stool). A purple-like color to the skin (purpura). Bruising. A rash that looks like pinpoint, purple-red spots (petechiae). How is this treated? Treatment of another condition that is causing the low platelet count. Medicines to help protect your platelets from being destroyed. A replacement (transfusion) of platelets to stop or prevent bleeding. Surgery to remove the spleen. Follow these instructions at home: Activity Avoid activities that could cause you to get hurt or bruised. Follow instructions about how to prevent falls. Take care not to cut yourself: When you shave. When you use scissors, needles, knives, or other tools. Take care not to burn yourself: When you use an iron. When you cook. General instructions  Check your skin and the inside of your mouth for bruises or blood as told by your  doctor. Check to see if there is blood in your spit (sputum), pee, and poop. Do this as told by your doctor. Do not drink alcohol. Take over-the-counter and prescription medicines only as told by your doctor. Do not take any medicines that have aspirin or NSAIDs in them. These medicines can thin your blood and cause you to bleed. Tell all of your doctors that you have this condition. Be sure to tell your dentist and eye doctor too. Contact a doctor if: You have bruises and you do not know why. Get help right away if: You are bleeding anywhere on your body. You have blood in your spit, pee, or poop. Summary Thrombocytopenia means that you have a low number of platelets in your blood. Platelets are needed for blood clotting. Symptoms of this condition include bleeding that is not normal, and bruising. Take care not to cut or burn yourself. This information is not intended to replace advice given to you by your health care provider. Make sure you discuss any questions you have with your health care provider. Document Revised: 09/08/2020 Document Reviewed: 03/01/2018 Elsevier Patient Education  [place].

## 2021-02-17 NOTE — Telephone Encounter (Signed)
CRITICAL VALUE STICKER  CRITICAL VALUE: Platelets < 5  RECEIVER (on-site recipient of call): Makayleigh Poliquin P. LPN  DATE & TIME NOTIFIED: 02/17/21 11:43 am  MESSENGER (representative from lab): Mardene Celeste  MD NOTIFIED: Dr. Burr Medico  TIME OF NOTIFICATION: 02/17/21 11:44 am

## 2021-02-17 NOTE — Telephone Encounter (Signed)
Unable to leave voicemail with follow-up appointments per 9/1 los.

## 2021-02-18 ENCOUNTER — Other Ambulatory Visit: Payer: Medicare HMO

## 2021-02-18 ENCOUNTER — Ambulatory Visit: Payer: Medicare HMO

## 2021-02-18 DIAGNOSIS — Z95828 Presence of other vascular implants and grafts: Secondary | ICD-10-CM | POA: Diagnosis not present

## 2021-02-18 DIAGNOSIS — C9310 Chronic myelomonocytic leukemia not having achieved remission: Secondary | ICD-10-CM | POA: Diagnosis not present

## 2021-02-18 DIAGNOSIS — M546 Pain in thoracic spine: Secondary | ICD-10-CM | POA: Diagnosis not present

## 2021-02-18 DIAGNOSIS — E119 Type 2 diabetes mellitus without complications: Secondary | ICD-10-CM | POA: Diagnosis not present

## 2021-02-18 DIAGNOSIS — Z7984 Long term (current) use of oral hypoglycemic drugs: Secondary | ICD-10-CM | POA: Diagnosis not present

## 2021-02-18 DIAGNOSIS — Z79899 Other long term (current) drug therapy: Secondary | ICD-10-CM | POA: Diagnosis not present

## 2021-02-18 DIAGNOSIS — Z9884 Bariatric surgery status: Secondary | ICD-10-CM | POA: Diagnosis not present

## 2021-02-18 DIAGNOSIS — D696 Thrombocytopenia, unspecified: Secondary | ICD-10-CM | POA: Diagnosis not present

## 2021-02-18 DIAGNOSIS — I10 Essential (primary) hypertension: Secondary | ICD-10-CM | POA: Diagnosis not present

## 2021-02-18 LAB — BASIC METABOLIC PANEL
BUN: 13 (ref 4–21)
CO2: 25 — AB (ref 13–22)
Chloride: 104 (ref 99–108)
Creatinine: 0.7 (ref 0.5–1.1)
Glucose: 260
Potassium: 4.6 (ref 3.4–5.3)
Sodium: 137 (ref 137–147)

## 2021-02-18 LAB — CBC AND DIFFERENTIAL
HCT: 26 — AB (ref 36–46)
Hemoglobin: 8.6 — AB (ref 12.0–16.0)
Neutrophils Absolute: 5.2
Platelets: 6 — AB (ref 150–399)
WBC: 7.1

## 2021-02-18 LAB — PREPARE PLATELET PHERESIS
Unit division: 0
Unit division: 0

## 2021-02-18 LAB — BPAM PLATELET PHERESIS
Blood Product Expiration Date: 202209102359
Blood Product Expiration Date: 202209102359
ISSUE DATE / TIME: 202209071251
Unit Type and Rh: 5100
Unit Type and Rh: 9500

## 2021-02-18 LAB — HEPATIC FUNCTION PANEL
ALT: 14 (ref 7–35)
AST: 16 (ref 13–35)
Alkaline Phosphatase: 117 (ref 25–125)
Bilirubin, Total: 0.6

## 2021-02-18 LAB — COMPREHENSIVE METABOLIC PANEL
Albumin: 3.5 (ref 3.5–5.0)
Calcium: 8.7 (ref 8.7–10.7)

## 2021-02-18 LAB — CBC: RBC: 3.02 — AB (ref 3.87–5.11)

## 2021-02-19 ENCOUNTER — Other Ambulatory Visit: Payer: Self-pay

## 2021-02-19 ENCOUNTER — Ambulatory Visit (INDEPENDENT_AMBULATORY_CARE_PROVIDER_SITE_OTHER): Payer: Medicare HMO

## 2021-02-19 DIAGNOSIS — C44319 Basal cell carcinoma of skin of other parts of face: Secondary | ICD-10-CM | POA: Diagnosis not present

## 2021-02-19 DIAGNOSIS — E538 Deficiency of other specified B group vitamins: Secondary | ICD-10-CM | POA: Diagnosis not present

## 2021-02-19 DIAGNOSIS — C9310 Chronic myelomonocytic leukemia not having achieved remission: Secondary | ICD-10-CM

## 2021-02-19 DIAGNOSIS — R233 Spontaneous ecchymoses: Secondary | ICD-10-CM | POA: Diagnosis not present

## 2021-02-19 MED ORDER — CYANOCOBALAMIN 1000 MCG/ML IJ SOLN
1000.0000 ug | Freq: Once | INTRAMUSCULAR | Status: AC
  Administered 2021-02-19: 1000 ug via INTRAMUSCULAR

## 2021-02-19 MED FILL — Dexamethasone Sodium Phosphate Inj 100 MG/10ML: INTRAMUSCULAR | Qty: 1 | Status: AC

## 2021-02-19 NOTE — Progress Notes (Signed)
Kathleen Gibson is a 70 y.o. female presents to the office today for monthly b12 injections, per physician's orders. Original order: every 4-week appointments if she wants to continue the B12 injections by nurse visit. Cyanocobalamin, 1060mg, IM was administered right deltoid today. Patient tolerated injection.     VShepard General CMA  Please sign in absence of PCP

## 2021-02-20 ENCOUNTER — Inpatient Hospital Stay: Payer: Medicare HMO

## 2021-02-20 DIAGNOSIS — D649 Anemia, unspecified: Secondary | ICD-10-CM | POA: Diagnosis not present

## 2021-02-20 DIAGNOSIS — Z7984 Long term (current) use of oral hypoglycemic drugs: Secondary | ICD-10-CM | POA: Diagnosis not present

## 2021-02-20 DIAGNOSIS — D693 Immune thrombocytopenic purpura: Secondary | ICD-10-CM | POA: Diagnosis not present

## 2021-02-20 DIAGNOSIS — C9310 Chronic myelomonocytic leukemia not having achieved remission: Secondary | ICD-10-CM | POA: Diagnosis not present

## 2021-02-20 DIAGNOSIS — E538 Deficiency of other specified B group vitamins: Secondary | ICD-10-CM | POA: Diagnosis not present

## 2021-02-20 DIAGNOSIS — R1012 Left upper quadrant pain: Secondary | ICD-10-CM | POA: Diagnosis not present

## 2021-02-20 DIAGNOSIS — E1142 Type 2 diabetes mellitus with diabetic polyneuropathy: Secondary | ICD-10-CM | POA: Diagnosis not present

## 2021-02-20 DIAGNOSIS — Z5112 Encounter for antineoplastic immunotherapy: Secondary | ICD-10-CM | POA: Diagnosis not present

## 2021-02-20 DIAGNOSIS — R519 Headache, unspecified: Secondary | ICD-10-CM | POA: Diagnosis not present

## 2021-02-20 DIAGNOSIS — M4724 Other spondylosis with radiculopathy, thoracic region: Secondary | ICD-10-CM | POA: Diagnosis not present

## 2021-02-20 DIAGNOSIS — I10 Essential (primary) hypertension: Secondary | ICD-10-CM | POA: Diagnosis not present

## 2021-02-20 DIAGNOSIS — Z79899 Other long term (current) drug therapy: Secondary | ICD-10-CM | POA: Diagnosis not present

## 2021-02-20 DIAGNOSIS — Z5111 Encounter for antineoplastic chemotherapy: Secondary | ICD-10-CM | POA: Diagnosis not present

## 2021-02-20 MED ORDER — SODIUM CHLORIDE 0.9% IV SOLUTION
250.0000 mL | Freq: Once | INTRAVENOUS | Status: AC
  Administered 2021-02-20: 250 mL via INTRAVENOUS

## 2021-02-22 ENCOUNTER — Inpatient Hospital Stay: Payer: Medicare HMO

## 2021-02-22 ENCOUNTER — Encounter: Payer: Self-pay | Admitting: Hematology

## 2021-02-22 ENCOUNTER — Telehealth: Payer: Self-pay

## 2021-02-22 ENCOUNTER — Telehealth: Payer: Self-pay | Admitting: Hematology

## 2021-02-22 ENCOUNTER — Inpatient Hospital Stay (HOSPITAL_BASED_OUTPATIENT_CLINIC_OR_DEPARTMENT_OTHER): Payer: Medicare HMO | Admitting: Hematology

## 2021-02-22 ENCOUNTER — Other Ambulatory Visit: Payer: Self-pay

## 2021-02-22 VITALS — BP 115/66 | HR 97 | Temp 98.1°F | Resp 18

## 2021-02-22 DIAGNOSIS — D696 Thrombocytopenia, unspecified: Secondary | ICD-10-CM

## 2021-02-22 DIAGNOSIS — R1012 Left upper quadrant pain: Secondary | ICD-10-CM | POA: Diagnosis not present

## 2021-02-22 DIAGNOSIS — Z5111 Encounter for antineoplastic chemotherapy: Secondary | ICD-10-CM | POA: Diagnosis not present

## 2021-02-22 DIAGNOSIS — R519 Headache, unspecified: Secondary | ICD-10-CM | POA: Diagnosis not present

## 2021-02-22 DIAGNOSIS — G8929 Other chronic pain: Secondary | ICD-10-CM

## 2021-02-22 DIAGNOSIS — I10 Essential (primary) hypertension: Secondary | ICD-10-CM | POA: Diagnosis not present

## 2021-02-22 DIAGNOSIS — E1142 Type 2 diabetes mellitus with diabetic polyneuropathy: Secondary | ICD-10-CM | POA: Diagnosis not present

## 2021-02-22 DIAGNOSIS — C9310 Chronic myelomonocytic leukemia not having achieved remission: Secondary | ICD-10-CM

## 2021-02-22 DIAGNOSIS — M546 Pain in thoracic spine: Secondary | ICD-10-CM

## 2021-02-22 DIAGNOSIS — Z79899 Other long term (current) drug therapy: Secondary | ICD-10-CM | POA: Diagnosis not present

## 2021-02-22 DIAGNOSIS — D693 Immune thrombocytopenic purpura: Secondary | ICD-10-CM

## 2021-02-22 DIAGNOSIS — Z5112 Encounter for antineoplastic immunotherapy: Secondary | ICD-10-CM | POA: Diagnosis not present

## 2021-02-22 DIAGNOSIS — Z7984 Long term (current) use of oral hypoglycemic drugs: Secondary | ICD-10-CM | POA: Diagnosis not present

## 2021-02-22 DIAGNOSIS — D649 Anemia, unspecified: Secondary | ICD-10-CM | POA: Diagnosis not present

## 2021-02-22 DIAGNOSIS — E538 Deficiency of other specified B group vitamins: Secondary | ICD-10-CM | POA: Diagnosis not present

## 2021-02-22 DIAGNOSIS — M4724 Other spondylosis with radiculopathy, thoracic region: Secondary | ICD-10-CM | POA: Diagnosis not present

## 2021-02-22 LAB — CBC WITH DIFFERENTIAL (CANCER CENTER ONLY)
Abs Immature Granulocytes: 0.1 10*3/uL — ABNORMAL HIGH (ref 0.00–0.07)
Basophils Absolute: 0 10*3/uL (ref 0.0–0.1)
Basophils Relative: 0 %
Eosinophils Absolute: 0 10*3/uL (ref 0.0–0.5)
Eosinophils Relative: 0 %
HCT: 26.1 % — ABNORMAL LOW (ref 36.0–46.0)
Hemoglobin: 8.3 g/dL — ABNORMAL LOW (ref 12.0–15.0)
Immature Granulocytes: 2 %
Lymphocytes Relative: 13 %
Lymphs Abs: 0.7 10*3/uL (ref 0.7–4.0)
MCH: 27.8 pg (ref 26.0–34.0)
MCHC: 31.8 g/dL (ref 30.0–36.0)
MCV: 87.3 fL (ref 80.0–100.0)
Monocytes Absolute: 0.8 10*3/uL (ref 0.1–1.0)
Monocytes Relative: 17 %
Neutro Abs: 3.5 10*3/uL (ref 1.7–7.7)
Neutrophils Relative %: 68 %
Platelet Count: 5 10*3/uL — CL (ref 150–400)
RBC: 2.99 MIL/uL — ABNORMAL LOW (ref 3.87–5.11)
RDW: 18.2 % — ABNORMAL HIGH (ref 11.5–15.5)
WBC Count: 5.1 10*3/uL (ref 4.0–10.5)
nRBC: 0 % (ref 0.0–0.2)

## 2021-02-22 LAB — BPAM PLATELET PHERESIS
Blood Product Expiration Date: 202209102359
ISSUE DATE / TIME: 202209100804
Unit Type and Rh: 5100

## 2021-02-22 LAB — PREPARE PLATELET PHERESIS: Unit division: 0

## 2021-02-22 LAB — CMP (CANCER CENTER ONLY)
ALT: 16 U/L (ref 0–44)
AST: 14 U/L — ABNORMAL LOW (ref 15–41)
Albumin: 3.3 g/dL — ABNORMAL LOW (ref 3.5–5.0)
Alkaline Phosphatase: 130 U/L — ABNORMAL HIGH (ref 38–126)
Anion gap: 10 (ref 5–15)
BUN: 13 mg/dL (ref 8–23)
CO2: 23 mmol/L (ref 22–32)
Calcium: 8.7 mg/dL — ABNORMAL LOW (ref 8.9–10.3)
Chloride: 106 mmol/L (ref 98–111)
Creatinine: 0.82 mg/dL (ref 0.44–1.00)
GFR, Estimated: >60 mL/min (ref >60)
Glucose, Bld: 250 mg/dL — ABNORMAL HIGH (ref 70–99)
Potassium: 4.4 mmol/L (ref 3.5–5.1)
Sodium: 139 mmol/L (ref 135–145)
Total Bilirubin: 0.8 mg/dL (ref 0.3–1.2)
Total Protein: 7.3 g/dL (ref 6.5–8.1)

## 2021-02-22 MED ORDER — SODIUM CHLORIDE 0.9% IV SOLUTION
250.0000 mL | Freq: Once | INTRAVENOUS | Status: AC
  Administered 2021-02-22: 250 mL via INTRAVENOUS

## 2021-02-22 MED ORDER — ROMIPLOSTIM INJECTION 500 MCG
10.0000 ug/kg | Freq: Once | SUBCUTANEOUS | Status: AC
  Administered 2021-02-22: 710 ug via SUBCUTANEOUS
  Filled 2021-02-22: qty 1

## 2021-02-22 MED ORDER — HEPARIN SOD (PORK) LOCK FLUSH 100 UNIT/ML IV SOLN
250.0000 [IU] | INTRAVENOUS | Status: AC | PRN
  Administered 2021-02-22: 250 [IU]

## 2021-02-22 MED ORDER — ACETAMINOPHEN-CODEINE #3 300-30 MG PO TABS: 1.0000 | ORAL_TABLET | Freq: Three times a day (TID) | ORAL | 0 refills | Status: DC | PRN

## 2021-02-22 MED ORDER — SODIUM CHLORIDE 0.9% FLUSH
10.0000 mL | INTRAVENOUS | Status: AC | PRN
  Administered 2021-02-22: 10 mL

## 2021-02-22 MED ORDER — LORAZEPAM 1 MG PO TABS: 1.0000 mg | ORAL_TABLET | Freq: Once | ORAL | 0 refills | Status: DC | PRN

## 2021-02-22 NOTE — Progress Notes (Signed)
[place]   [phone] [phone]   Clinic Follow up Note   Patient Care Team: [name], DO as PCP - General (Family Medicine) Loletha Carrow Kirke Corin, MD as Consulting Physician (Gastroenterology) Truitt Merle, MD as Consulting Physician (Hematology) Alda Berthold, DO as Consulting Physician (Neurology)  Date of Service:  02/22/2021  CHIEF COMPLAINT: f/u of [name], severe thrombocytopenia  CURRENT THERAPY:  Azacitidine days 1-5 q28 days, started 12/28/20 Nplate 41mcg/kg weekly  Platelet transfusion as needed (plt<20K)   ASSESSMENT & PLAN:  Kathleen Gibson is a 70 y.o. female with   1. [name] with severe thrombocytopenia  -She was initially diagnosed with ITP in 2014. She lost follow up after 11/2017 and did not proceed with recommended bone marrow biopsy. -She was referred to ED 10/28/20 by her PCP for low plt count of 4k. She was hospitalized and treated with platelet transfusions, IVIG, oral prednisone $RemoveBeforeD'60mg'YchriRZKXYYBVN$  and Nplate injections. She tapered off prednisone given little response. -Bone marrow biopsy 10/30/20 felt to likely represent MDS/MPN, particularly [name]. Confirmed diagnosis of [name] on slide review at [facility] by Dr. Linus Orn -She received treatment for severe refractory thrombocytopenia with weekly Rituxan x4 on 11/19/20, but she did not respond  -She began azacitidine daily days 1-5 q. 28 days on 12/28/20, s/p 2 cycles  -She had worsening thrombocytopenia after stopping Nplate, restarted weekly Nplate 10 mcg/kg on 02/04/538 -Her thrombocytopenia did improve after first cycle chemo, but it dropped significantly after second cycle -Labs reviewed, plt 5 today, will proceed with plt transfusion  -plan to start C3 chemo next Monday    2. Normocytic anemia, moderate  -h/o pernicious anemia/vitamin B12 deficiency -with recent severe thrombocytopenia, she has had loq ferritin and iron study with moderate anemia. She required blood transfusion  in hospital. Will give blood transfusion for Hg <=7.5.  -Hg 8.2 today (02/22/21), no need blood transfusion    3. Left UQ abdominal pain, back pain -abdominal pain likely secondary to splenomegaly. Currently tolerable, continue monitoring. -back pain began in early 01/2021. Now constant and worsens with movement. She notes the flexeril did not help. I offered pain medication for her. She would like to try tylenol 3. -I recommend MRI. She notes she is claustrophobic, so I will prescribe ativan for her.   4. Headache -no other neurological symptoms, will continue monitor clinically, take Tylenol as needed  5. Type 2 diabetes mellitus with polyneuropathy, Hypertension -Continue medication, she will follow-up with her family doctor     PLAN:  -I prescribed tylenol #3 for her to try for back pain -proceed with platelet transfusion and Nplate today -plan for C3 Azacitidine fusion next week, 03/01/21 (days 1-5 of 28) -thoacic spine MRI to be done soon, I prescribed ativan -labs and Nplate weekly on Mondays -f/u in a week     No problem-specific Assessment & Plan notes found for this encounter.   SUMMARY OF ONCOLOGIC HISTORY: Oncology History  [name] (chronic myelomonocytic leukemia) ([place])  10/29/2020 Imaging   CT CAP  IMPRESSION: 1. Splenomegaly with pathologically enlarged lymph nodes above and below the diaphragm, with overall stable to minimally increased abdominal adenopathy and interval progression of the pelvic adenopathy. 2. Small volume abdominopelvic ascites with diffuse mesenteric stranding. 3. Scattered bilateral pulmonary micro nodules measuring 1-2 mm. 4. Distended gallbladder with some layering hyperdense material representing layering sludge and tiny stones seen on prior ultrasound. 5. Aortic atherosclerosis.   10/30/2020 Pathology Results   DIAGNOSIS:   BONE  MARROW, ASPIRATE, CLOT, CORE:  -  Hypercellular bone marrow with panhyperplasia, atypia, and no  increase in  blasts  -  See comment   PERIPHERAL BLOOD:  -  Marked thrombocytopenia  -  Absolute monocytosis  -  Normocytic anemia  -  See CBC data and comment   COMMENT:  The bone marrow is hypercellular for age (approximately 80%) with myeloid hyperplasia with maturational left shift, erythroid hyperplasia, and increased megakaryocytes.  Mild multilineage atypia is present. Blasts are not increased on aspirate smears (1% by manual differential count) or by CD34 immunohistochemistry on the core biopsy.  Concurrent flow cytometric analysis of the bone marrow aspirate demonstrates increased monocytes, and no increase in blasts or abnormal lymphoid population (see [id number]).  Monocytes are also increased in peripheral  blood, persistent per electronic medical record.  In aggregate, the  findings raise the possibility of a myeloid neoplasm with the  differential diagnosis including a low-grade myelodysplastic syndrome and chronic myelomonocytic leukemia (dysplastic type).    ADDENDUM:  A reticulin special stain performed on the bone marrow core biopsy reveals no significant increase in reticulin fibrosis.  ADDENDUM:  CYTOGENETIC RESULTS:  Karyotype: 46,XX[20]  Interpretation: NORMAL FEMALE KARYOTYPE   FISH RESULTS:  Results: NORMAL   ADDENDUM:  CD123 immunohistochemistry performed on the core biopsy highlights scattered aggregates of positively staining cells consistent with plasmacytoid dendritic cells.    10/30/2020 Pathology Results   DIAGNOSIS:   BONE MARROW; FLOW CYTOMETRIC ANALYSIS:  -  Increased monocytes  -  Scant B-cells present  -  No immunophenotypically aberrant T-cell population identified  -  No increase in blasts  -  See comment   COMMENT:  Monocytes are relatively increased (12% of all cells), without aberrant expression of CD56.  B-cells comprise <1% of total lymphocytes. CD34-positive blasts are not increased (<1% of all cells).  Correlation with concurrent morphology is  recommended for complete diagnostic interpretation and overall blast enumeration (see [id number]).    10/30/2020 Pathology Results   FINAL MICROSCOPIC DIAGNOSIS:   A. LYMPH NODE, RIGHT AXILLARY, NEEDLE CORE BIOPSY:  -Lymphoid tissue present  -See comment   COMMENT:  The sections show several small needle core biopsy fragments of lymphoid tissue displaying degenerative cellular changes/necrosis and hence cannot be accurately evaluated.  Sample for flow cytometric analysis not available.  Immunohistochemical stain for CD3 and CD20 were performed with appropriate controls.  There is a mixture of T and B cells in their apparently respective compartments.  There is no definite metastatic malignancy.    11/11/2020 Pathology Results   DIAGNOSIS:   LEFT AXILLARY LYMPH NODE EXCISIONAL BIOPSY; FLOW CYTOMETRIC ANALYSIS:  -  No monotypic B-cell or immunophenotypically aberrant T-cell  population identified  -  See comment   COMMENT:  Flow cytometric analysis identifies B-cells with a normal kappa:lambda ratio of 1.7:1.  A subset of the polytypic B-cells expresses CD10. T-cells show a CD4:CD8 ratio of 3.3:1 without immunophenotypic aberrancy with the markers evaluated.  Although these results do not support the diagnosis of a clonal lymphoid population, sampling issues must always be considered when negative results are obtained, as focal lesions may not be represented in the specimen submitted.  In addition, flow cytometric immunophenotyping will not exclude other pathology if present (e.g. Hodgkin lymphoma, some T-cell lymphomas, metastatic and infectious diseases).   11/11/2020 Pathology Results   FINAL MICROSCOPIC DIAGNOSIS:   A. LYMPH NODE, LEFT AXILLARY, DISSECTION:  -  Follicular hyperplasia with interfollicular expansion  -  See comment  COMMENT:  Sections of the lymph nodes reveal generally preserved lymph node architecture.  There is follicular hyperplasia with some follicles showing  increased hyalinization of germinal centers and mild concentric encircling of germinal centers with small lymphocytes (onion skinning). Interfollicular areas are expanded with increased vascular proliferation, small lymphocytes, plasma cells, histiocytic cells, and patchy neutrophils.  The lymph node capsule is variably thickened by fibrosis with few plasma cells.   A panel of immunohistochemical stains is performed for further  characterization.  CD3 and CD20/PAX5 highlight T-cell and B-cell  compartments, respectively.  Germinal centers are highlighted by CD10 and BCL6 with appropriate absent expression of BCL2.  CD5 is similar to CD3.  CD43 also stains the T-cells.  CD4-positive T-cells exceed CD8-positive T-cells.  CD30 highlights scattered immunoblasts.  CD21 reveals intact follicular dendritic cell meshworks.  CD68 highlights increased histiocytic cells.  HHV 8 is negative.  T. pallidum reveals no definitive organisms.  Pancytokeratin is negative.  TdT stains rare cells.  CD138 highlights plasma cells which are not increased in number and show polytypic light chain expression by kappa/lambda in situ  hybridization.  EBV is negative by in situ hybridization.   Concurrent flow cytometric analysis is negative for a monoclonal B-cell or immunophenotypically aberrant T-cell population (see [id number]).   Together, the findings above are non-specific and can be seen in  reactive and infectious processes as well as [place] disease.  There is no definitive morphologic or flow cytometric evidence of involvement by a lymphoproliferative disorder from the current workup.   12/17/2020 Initial Diagnosis   [name] (chronic myelomonocytic leukemia) ([place])   12/28/2020 -  Chemotherapy    Patient is on Treatment Plan: MYELODYSPLASIA  AZACITIDINE IV D1-5 Q28D          INTERVAL HISTORY:  SHAKETA SERAFIN is here for a follow up of [name] and severe thrombocytopenia. She was last seen by me on 02/11/21. She was seen in  the infusion area. She notes she drove herself today. She notes she received a transfusion at [facility].  She reports her back pain is constant and worsens with movement.   All other systems were reviewed with the patient and are negative.  MEDICAL HISTORY:  Past Medical History:  Diagnosis Date   Diabetes ([place])    HLD (hyperlipidemia)    Hypertension    Pernicious anemia    Pneumonia 09/05/2019   Renal insufficiency 09/06/2019   Vitamin D deficiency     SURGICAL HISTORY: Past Surgical History:  Procedure Laterality Date   ABDOMINAL HERNIA REPAIR  2009   ABDOMINOPLASTY     AXILLARY LYMPH NODE BIOPSY Left 11/11/2020   Procedure: LEFT AXILLARY EXCISIONAL LYMPH NODE BIOPSY;  Surgeon: [name], MD;  Location: [facility] OR;  Service: General;  Laterality: Left;   [place]   hemorroid surgery  2007   [place]    I have reviewed the social history and family history with the patient and they are unchanged from previous note.  ALLERGIES:  is allergic to asa [aspirin] and nsaids.  MEDICATIONS:  Current Outpatient Medications  Medication Sig Dispense Refill   LORazepam (ATIVAN) 1 MG tablet Take 1 tablet (1 mg total) by mouth once as needed for up to 1 dose for anxiety. Tale 1 tab 1 hour before MRI, and OK to take second tab right before MRI if needed 30 tablet 0   cyclobenzaprine (FLEXERIL) 5 MG tablet Take 1 tablet (5 mg  total) by mouth every 8 (eight) hours as needed for muscle spasms. 30 tablet 0   ferrous sulfate 325 (65 FE) MG tablet Take 2 tablets (650 mg total) by mouth daily with breakfast. 180 tablet 3   gabapentin (NEURONTIN) 300 MG capsule Take 2 capsules (600 mg total) by mouth 2 (two) times daily. 360 capsule 1   glucose monitoring kit (FREESTYLE) monitoring kit 1 each by Does not apply route as needed for other. Use as directed 1 each 0   metoprolol succinate (TOPROL-XL) 50 MG 24 hr tablet Take 1 tablet (50 mg  total) by mouth daily. 90 tablet 1   sitaGLIPtin-metformin (JANUMET) 50-1000 MG tablet Take 1 tablet by mouth at bedtime. 90 tablet 1   Vitamin D, Cholecalciferol, 25 MCG (1000 UT) CAPS Take 1,000 Units by mouth daily.      zolpidem (AMBIEN) 5 MG tablet Take 1 tablet (5 mg total) by mouth at bedtime. 90 tablet 1   No current facility-administered medications for this visit.   Facility-Administered Medications Ordered in Other Visits  Medication Dose Route Frequency Provider Last Rate Last Admin   0.9 %  sodium chloride infusion (Manually program via Guardrails IV Fluids)  250 mL Intravenous Once Truitt Merle, MD       heparin lock flush 100 unit/mL  250 Units Intracatheter PRN Truitt Merle, MD       romiPLOStim (NPLATE) injection 710 mcg  10 mcg/kg Subcutaneous Once Truitt Merle, MD       sodium chloride flush (NS) 0.9 % injection 10 mL  10 mL Intracatheter PRN Truitt Merle, MD        PHYSICAL EXAMINATION: ECOG PERFORMANCE STATUS: 2 - Symptomatic, <50% confined to bed  There were no vitals filed for this visit. Wt Readings from Last 3 Encounters:  02/11/21 156 lb 11.2 oz (71.1 kg)  01/29/21 164 lb 12.8 oz (74.8 kg)  01/28/21 166 lb (75.3 kg)     GENERAL:alert, no distress and comfortable SKIN: skin color normal, no rashes or significant lesions EYES: normal, Conjunctiva are pink and non-injected, sclera clear  NEURO: alert & oriented x 3 with fluent speech  LABORATORY DATA:  I have reviewed the data as listed CBC Latest Ref Rng & Units 02/22/2021 02/17/2021 02/12/2021  WBC 4.0 - 10.5 K/uL 5.1 8.9 12.4(H)  Hemoglobin 12.0 - 15.0 g/dL 8.3(L) 8.6(L) 8.9(L)  Hematocrit 36.0 - 46.0 % 26.1(L) 27.0(L) 28.3(L)  Platelets 150 - 400 K/uL 5(LL) <5(LL) <5(LL)     CMP Latest Ref Rng & Units 02/22/2021 02/17/2021 02/11/2021  Glucose 70 - 99 mg/dL 250(H) 335(H) 296(H)  BUN 8 - 23 mg/dL $Remove'13 16 16  'XbhtzIY$ Creatinine 0.44 - 1.00 mg/dL 0.82 0.95 0.83  Sodium 135 - 145 mmol/L 139 138 139  Potassium 3.5 - 5.1 mmol/L 4.4  4.1 4.3  Chloride 98 - 111 mmol/L 106 106 105  CO2 22 - 32 mmol/L $RemoveB'23 22 23  'XxBMVHFj$ Calcium 8.9 - 10.3 mg/dL 8.7(L) 8.7(L) 8.9  Total Protein 6.5 - 8.1 g/dL 7.3 7.5 7.5  Total Bilirubin 0.3 - 1.2 mg/dL 0.8 0.6 0.7  Alkaline Phos 38 - 126 U/L 130(H) 137(H) 137(H)  AST 15 - 41 U/L 14(L) 14(L) 16  ALT 0 - 44 U/L $Remo'16 14 16      'xAlWU$ RADIOGRAPHIC STUDIES: I have personally reviewed the radiological images as listed and agreed with the findings in the report. No results found.    Orders Placed This Encounter  Procedures   MR THORACIC SPINE W WO  CONTRAST    Standing Status:   Future    Standing Expiration Date:   02/22/2022    Order Specific Question:   GRA to provide read?    Answer:   Yes    Order Specific Question:   If indicated for the ordered procedure, I authorize the administration of contrast media per Radiology protocol    Answer:   Yes    Order Specific Question:   What is the patient's sedation requirement?    Answer:   Anti-anxiety    Order Specific Question:   Use SRS Protocol?    Answer:   No    Order Specific Question:   Does the patient have a pacemaker or implanted devices?    Answer:   No    Order Specific Question:   Preferred imaging location?    Answer:   [facility] (table limit - 550 lbs)   All questions were answered. The patient knows to call the clinic with any problems, questions or concerns. No barriers to learning was detected. The total time spent in the appointment was 30 minutes.     Truitt Merle, MD 02/22/2021   I, Wilburn Mylar, am acting as scribe for Truitt Merle, MD.   I have reviewed the above documentation for accuracy and completeness, and I agree with the above.

## 2021-02-22 NOTE — Telephone Encounter (Signed)
Left message with follow-up appointments per 9/12 los.

## 2021-02-22 NOTE — Patient Instructions (Signed)
Platelet Transfusion A platelet transfusion is a procedure in which you receive donated platelets through an IV. Platelets are tiny pieces of blood cells. When you get an injury, platelets clump together in the area to form a blood clot. This helps stop bleeding and is the beginning of the healing process. If you have too few platelets, your blood may have trouble clotting. This may cause you to bleed and bruise very easily. You may need a platelet transfusion if you have a condition that causes a low number of platelets (thrombocytopenia). A platelet transfusion may be used to stop or prevent excessive bleeding. Tell a health care provider about: Any reactions you have had during previous transfusions. Any allergies you have. All medicines you are taking, including vitamins, herbs, eye drops, creams, and over-the-counter medicines. Any blood disorders you have. Any surgeries you have had. Any medical conditions you have. Whether you are pregnant or may be pregnant. What are the risks? Generally, this is a safe procedure. However, problems may occur, including: Fever. Infection. Allergic reaction to the donor platelets. Your body's disease-fighting system (immune system) attacking the donor platelets (hemolytic reaction). This is rare. A rare reaction that causes lung damage (transfusion-related acute lung injury). What happens before the procedure? Medicines Ask your health care provider about: Changing or stopping your regular medicines. This is especially important if you are taking diabetes medicines or blood thinners. Taking medicines such as aspirin and ibuprofen. These medicines can thin your blood. Do not take these medicines unless your health care provider tells you to take them. Taking over-the-counter medicines, vitamins, herbs, and supplements. General instructions You will have a blood test to determine your blood type. Your blood type determines what kind of platelets you will  be given. Follow instructions from your health care provider about eating or drinking restrictions. If you have had an allergic reaction to a transfusion in the past, you may be given medicine to help prevent a reaction. Your temperature, blood pressure, pulse, and breathing will be monitored. What happens during the procedure?  An IV will be inserted into one of your veins. For your safety, two health care providers will verify your identity along with the donor platelets about to be infused. A bag of donor platelets will be connected to your IV. The platelets will flow into your bloodstream. This usually takes 30-60 minutes. Your temperature, blood pressure, pulse, and breathing will be monitored during the transfusion. This helps detect early signs of any reaction. You will also be monitored for other symptoms that may indicate a reaction, including chills, hives, or itching. If you have signs of a reaction at any time, your transfusion will be stopped, and you may be given medicine to help manage the reaction. When your transfusion is complete, your IV will be removed. Pressure may be applied to the IV site for a few minutes to stop any bleeding. The IV site will be covered with a bandage (dressing). The procedure may vary among health care providers and hospitals. What happens after the procedure? Your blood pressure, temperature, pulse, and breathing will be monitored until you leave the hospital or clinic. You may have some bruising and soreness at your IV site. Follow these instructions at home: Medicines Take over-the-counter and prescription medicines only as told by your health care provider. Talk with your health care provider before you take any medicines that contain aspirin or NSAIDs. These medicines increase your risk for dangerous bleeding. General instructions Change or remove your dressing as   told by your health care provider. Return to your normal activities as told by  your health care provider. Ask your health care provider what activities are safe for you. Do not take baths, swim, or use a hot tub until your health care provider approves. Ask your health care provider if you may take showers. Check your IV site every day for signs of infection. Check for: Redness, swelling, or pain. Fluid or blood. If fluid or blood drains from your IV site, use your hands to press down firmly on a bandage covering the area for a minute or two. Doing this should stop the bleeding. Warmth. Pus or a bad smell. Keep all follow-up visits as told by your health care provider. This is important. Contact a health care provider if you have: A headache that does not go away with medicine. Hives, rash, or itchy skin. Nausea or vomiting. Unusual tiredness or weakness. Signs of infection at your IV site. Get help right away if: You have a fever or chills. You urinate less often than usual. Your urine is darker colored than normal. You have any of the following: Trouble breathing. Pain in your back, abdomen, or chest. Cool, clammy skin. A fast heartbeat. Summary Platelets are tiny pieces of blood cells that clump together to form a blood clot when you have an injury. If you have too few platelets, your blood may have trouble clotting. A platelet transfusion is a procedure in which you receive donated platelets through an IV. A platelet transfusion may be used to stop or prevent excessive bleeding. After the procedure, check your IV site every day for signs of infection, including redness, swelling, pain, or warmth. This information is not intended to replace advice given to you by your health care provider. Make sure you discuss any questions you have with your health care provider. Document Revised: 09/08/2020 Document Reviewed: 07/05/2017 Elsevier Patient Education  2022 [employer]  

## 2021-02-22 NOTE — Telephone Encounter (Signed)
CRITICAL VALUE STICKER  CRITICAL VALUE: PLATELET    5  RECEIVER (on-site recipient of call): Myliyah Rebuck P. LPN  DATE & TIME NOTIFIED: 02/22/21 8:19am  MESSENGER (representative from lab): Ulice Dash   MD NOTIFIED: Dr. Burr Medico  TIME OF NOTIFICATION: 8:22am

## 2021-02-23 ENCOUNTER — Ambulatory Visit: Payer: Medicare HMO

## 2021-02-23 ENCOUNTER — Encounter: Payer: Self-pay | Admitting: Hematology

## 2021-02-23 LAB — PREPARE PLATELET PHERESIS: Unit division: 0

## 2021-02-23 LAB — BPAM PLATELET PHERESIS
Blood Product Expiration Date: 202209152359
ISSUE DATE / TIME: 202209120916
Unit Type and Rh: 5100

## 2021-02-24 ENCOUNTER — Inpatient Hospital Stay: Payer: Medicare HMO

## 2021-02-24 ENCOUNTER — Other Ambulatory Visit: Payer: Self-pay

## 2021-02-24 ENCOUNTER — Other Ambulatory Visit: Payer: Self-pay | Admitting: Hematology

## 2021-02-24 DIAGNOSIS — Z5111 Encounter for antineoplastic chemotherapy: Secondary | ICD-10-CM | POA: Diagnosis not present

## 2021-02-24 DIAGNOSIS — R519 Headache, unspecified: Secondary | ICD-10-CM | POA: Diagnosis not present

## 2021-02-24 DIAGNOSIS — Z79899 Other long term (current) drug therapy: Secondary | ICD-10-CM | POA: Diagnosis not present

## 2021-02-24 DIAGNOSIS — E1142 Type 2 diabetes mellitus with diabetic polyneuropathy: Secondary | ICD-10-CM | POA: Diagnosis not present

## 2021-02-24 DIAGNOSIS — C9310 Chronic myelomonocytic leukemia not having achieved remission: Secondary | ICD-10-CM

## 2021-02-24 DIAGNOSIS — E538 Deficiency of other specified B group vitamins: Secondary | ICD-10-CM | POA: Diagnosis not present

## 2021-02-24 DIAGNOSIS — D693 Immune thrombocytopenic purpura: Secondary | ICD-10-CM | POA: Diagnosis not present

## 2021-02-24 DIAGNOSIS — Z5112 Encounter for antineoplastic immunotherapy: Secondary | ICD-10-CM | POA: Diagnosis not present

## 2021-02-24 DIAGNOSIS — D649 Anemia, unspecified: Secondary | ICD-10-CM | POA: Diagnosis not present

## 2021-02-24 DIAGNOSIS — R1012 Left upper quadrant pain: Secondary | ICD-10-CM | POA: Diagnosis not present

## 2021-02-24 DIAGNOSIS — M4724 Other spondylosis with radiculopathy, thoracic region: Secondary | ICD-10-CM | POA: Diagnosis not present

## 2021-02-24 DIAGNOSIS — Z7984 Long term (current) use of oral hypoglycemic drugs: Secondary | ICD-10-CM | POA: Diagnosis not present

## 2021-02-24 DIAGNOSIS — I10 Essential (primary) hypertension: Secondary | ICD-10-CM | POA: Diagnosis not present

## 2021-02-24 MED ORDER — SODIUM CHLORIDE 0.9% FLUSH
3.0000 mL | INTRAVENOUS | Status: AC | PRN
  Administered 2021-02-24: 3 mL

## 2021-02-24 MED ORDER — HEPARIN SOD (PORK) LOCK FLUSH 100 UNIT/ML IV SOLN
250.0000 [IU] | INTRAVENOUS | Status: AC | PRN
  Administered 2021-02-24: 250 [IU]

## 2021-02-24 NOTE — Progress Notes (Unsigned)
This nurse spoke with Blood Bank this morning and made aware of orders for platelet transfusion today for random platelets.  Also made aware that patient will have a transfusion on Friday 02/26/21 with HLA matched platelets.  This nurse was advised to put in the order today to prepare the HLA matched platelets and then put in the transfuse order on tomorrow.  This nurse acknowledged understanding.  No further questions or concerns at this time.

## 2021-02-25 ENCOUNTER — Ambulatory Visit: Payer: Medicare HMO

## 2021-02-25 ENCOUNTER — Other Ambulatory Visit: Payer: Self-pay

## 2021-02-25 DIAGNOSIS — C9310 Chronic myelomonocytic leukemia not having achieved remission: Secondary | ICD-10-CM

## 2021-02-25 LAB — PREPARE PLATELET PHERESIS: Unit division: 0

## 2021-02-25 LAB — BPAM PLATELET PHERESIS
Blood Product Expiration Date: 202209162359
ISSUE DATE / TIME: 202209140946
Unit Type and Rh: 5100

## 2021-02-26 ENCOUNTER — Other Ambulatory Visit: Payer: Self-pay

## 2021-02-26 ENCOUNTER — Inpatient Hospital Stay: Payer: Medicare HMO

## 2021-02-26 DIAGNOSIS — C9310 Chronic myelomonocytic leukemia not having achieved remission: Secondary | ICD-10-CM

## 2021-02-26 DIAGNOSIS — Z7984 Long term (current) use of oral hypoglycemic drugs: Secondary | ICD-10-CM | POA: Diagnosis not present

## 2021-02-26 DIAGNOSIS — I10 Essential (primary) hypertension: Secondary | ICD-10-CM | POA: Diagnosis not present

## 2021-02-26 DIAGNOSIS — E1142 Type 2 diabetes mellitus with diabetic polyneuropathy: Secondary | ICD-10-CM | POA: Diagnosis not present

## 2021-02-26 DIAGNOSIS — D693 Immune thrombocytopenic purpura: Secondary | ICD-10-CM | POA: Diagnosis not present

## 2021-02-26 DIAGNOSIS — Z79899 Other long term (current) drug therapy: Secondary | ICD-10-CM | POA: Diagnosis not present

## 2021-02-26 DIAGNOSIS — M4724 Other spondylosis with radiculopathy, thoracic region: Secondary | ICD-10-CM | POA: Diagnosis not present

## 2021-02-26 DIAGNOSIS — E538 Deficiency of other specified B group vitamins: Secondary | ICD-10-CM | POA: Diagnosis not present

## 2021-02-26 DIAGNOSIS — D649 Anemia, unspecified: Secondary | ICD-10-CM | POA: Diagnosis not present

## 2021-02-26 DIAGNOSIS — R1012 Left upper quadrant pain: Secondary | ICD-10-CM | POA: Diagnosis not present

## 2021-02-26 DIAGNOSIS — Z5112 Encounter for antineoplastic immunotherapy: Secondary | ICD-10-CM | POA: Diagnosis not present

## 2021-02-26 DIAGNOSIS — R519 Headache, unspecified: Secondary | ICD-10-CM | POA: Diagnosis not present

## 2021-02-26 DIAGNOSIS — D696 Thrombocytopenia, unspecified: Secondary | ICD-10-CM

## 2021-02-26 DIAGNOSIS — Z5111 Encounter for antineoplastic chemotherapy: Secondary | ICD-10-CM | POA: Diagnosis not present

## 2021-02-26 LAB — CMP (CANCER CENTER ONLY)
ALT: 12 U/L (ref 0–44)
AST: 13 U/L — ABNORMAL LOW (ref 15–41)
Albumin: 3.4 g/dL — ABNORMAL LOW (ref 3.5–5.0)
Alkaline Phosphatase: 128 U/L — ABNORMAL HIGH (ref 38–126)
Anion gap: 8 (ref 5–15)
BUN: 15 mg/dL (ref 8–23)
CO2: 25 mmol/L (ref 22–32)
Calcium: 8.8 mg/dL — ABNORMAL LOW (ref 8.9–10.3)
Chloride: 105 mmol/L (ref 98–111)
Creatinine: 0.87 mg/dL (ref 0.44–1.00)
GFR, Estimated: >60 mL/min (ref >60)
Glucose, Bld: 226 mg/dL — ABNORMAL HIGH (ref 70–99)
Potassium: 3.9 mmol/L (ref 3.5–5.1)
Sodium: 138 mmol/L (ref 135–145)
Total Bilirubin: 0.7 mg/dL (ref 0.3–1.2)
Total Protein: 7.6 g/dL (ref 6.5–8.1)

## 2021-02-26 LAB — CBC WITH DIFFERENTIAL (CANCER CENTER ONLY)
Abs Immature Granulocytes: 0.12 10*3/uL — ABNORMAL HIGH (ref 0.00–0.07)
Basophils Absolute: 0 10*3/uL (ref 0.0–0.1)
Basophils Relative: 1 %
Eosinophils Absolute: 0 10*3/uL (ref 0.0–0.5)
Eosinophils Relative: 1 %
HCT: 27.8 % — ABNORMAL LOW (ref 36.0–46.0)
Hemoglobin: 8.5 g/dL — ABNORMAL LOW (ref 12.0–15.0)
Immature Granulocytes: 2 %
Lymphocytes Relative: 11 %
Lymphs Abs: 0.6 10*3/uL — ABNORMAL LOW (ref 0.7–4.0)
MCH: 27.3 pg (ref 26.0–34.0)
MCHC: 30.6 g/dL (ref 30.0–36.0)
MCV: 89.4 fL (ref 80.0–100.0)
Monocytes Absolute: 1 10*3/uL (ref 0.1–1.0)
Monocytes Relative: 19 %
Neutro Abs: 3.7 10*3/uL (ref 1.7–7.7)
Neutrophils Relative %: 66 %
Platelet Count: <5 10*3/uL — CL (ref 150–400)
RBC: 3.11 MIL/uL — ABNORMAL LOW (ref 3.87–5.11)
RDW: 18.1 % — ABNORMAL HIGH (ref 11.5–15.5)
WBC Count: 5.6 10*3/uL (ref 4.0–10.5)
nRBC: 0 % (ref 0.0–0.2)

## 2021-02-26 MED ORDER — SODIUM CHLORIDE 0.9% FLUSH: 10.0000 mL | INTRAVENOUS | Status: DC | PRN

## 2021-02-26 MED ORDER — SODIUM CHLORIDE 0.9% IV SOLUTION
250.0000 mL | Freq: Once | INTRAVENOUS | Status: AC
Start: 2021-02-26 — End: 2021-02-26
  Administered 2021-02-26: 250 mL via INTRAVENOUS

## 2021-02-26 MED ORDER — HEPARIN SOD (PORK) LOCK FLUSH 100 UNIT/ML IV SOLN: 250.0000 [IU] | INTRAVENOUS | Status: DC | PRN

## 2021-02-26 NOTE — Patient Instructions (Signed)
Platelet Transfusion A platelet transfusion is a procedure in which you receive donated platelets through an IV. Platelets are tiny pieces of blood cells. When you get an injury, platelets clump together in the area to form a blood clot. This helps stop bleeding and is the beginning of the healing process. If you have too few platelets, your blood may have trouble clotting. This may cause you to bleed and bruise very easily. You may need a platelet transfusion if you have a condition that causes a low number of platelets (thrombocytopenia). A platelet transfusion may be used to stop or prevent excessive bleeding. Tell a health care provider about: Any reactions you have had during previous transfusions. Any allergies you have. All medicines you are taking, including vitamins, herbs, eye drops, creams, and over-the-counter medicines. Any blood disorders you have. Any surgeries you have had. Any medical conditions you have. Whether you are pregnant or may be pregnant. What are the risks? Generally, this is a safe procedure. However, problems may occur, including: Fever. Infection. Allergic reaction to the donor platelets. Your body's disease-fighting system (immune system) attacking the donor platelets (hemolytic reaction). This is rare. A rare reaction that causes lung damage (transfusion-related acute lung injury). What happens before the procedure? Medicines Ask your health care provider about: Changing or stopping your regular medicines. This is especially important if you are taking diabetes medicines or blood thinners. Taking medicines such as aspirin and ibuprofen. These medicines can thin your blood. Do not take these medicines unless your health care provider tells you to take them. Taking over-the-counter medicines, vitamins, herbs, and supplements. General instructions You will have a blood test to determine your blood type. Your blood type determines what kind of platelets you will  be given. Follow instructions from your health care provider about eating or drinking restrictions. If you have had an allergic reaction to a transfusion in the past, you may be given medicine to help prevent a reaction. Your temperature, blood pressure, pulse, and breathing will be monitored. What happens during the procedure?  An IV will be inserted into one of your veins. For your safety, two health care providers will verify your identity along with the donor platelets about to be infused. A bag of donor platelets will be connected to your IV. The platelets will flow into your bloodstream. This usually takes 30-60 minutes. Your temperature, blood pressure, pulse, and breathing will be monitored during the transfusion. This helps detect early signs of any reaction. You will also be monitored for other symptoms that may indicate a reaction, including chills, hives, or itching. If you have signs of a reaction at any time, your transfusion will be stopped, and you may be given medicine to help manage the reaction. When your transfusion is complete, your IV will be removed. Pressure may be applied to the IV site for a few minutes to stop any bleeding. The IV site will be covered with a bandage (dressing). The procedure may vary among health care providers and hospitals. What happens after the procedure? Your blood pressure, temperature, pulse, and breathing will be monitored until you leave the hospital or clinic. You may have some bruising and soreness at your IV site. Follow these instructions at home: Medicines Take over-the-counter and prescription medicines only as told by your health care provider. Talk with your health care provider before you take any medicines that contain aspirin or NSAIDs. These medicines increase your risk for dangerous bleeding. General instructions Change or remove your dressing as   told by your health care provider. Return to your normal activities as told by  your health care provider. Ask your health care provider what activities are safe for you. Do not take baths, swim, or use a hot tub until your health care provider approves. Ask your health care provider if you may take showers. Check your IV site every day for signs of infection. Check for: Redness, swelling, or pain. Fluid or blood. If fluid or blood drains from your IV site, use your hands to press down firmly on a bandage covering the area for a minute or two. Doing this should stop the bleeding. Warmth. Pus or a bad smell. Keep all follow-up visits as told by your health care provider. This is important. Contact a health care provider if you have: A headache that does not go away with medicine. Hives, rash, or itchy skin. Nausea or vomiting. Unusual tiredness or weakness. Signs of infection at your IV site. Get help right away if: You have a fever or chills. You urinate less often than usual. Your urine is darker colored than normal. You have any of the following: Trouble breathing. Pain in your back, abdomen, or chest. Cool, clammy skin. A fast heartbeat. Summary Platelets are tiny pieces of blood cells that clump together to form a blood clot when you have an injury. If you have too few platelets, your blood may have trouble clotting. A platelet transfusion is a procedure in which you receive donated platelets through an IV. A platelet transfusion may be used to stop or prevent excessive bleeding. After the procedure, check your IV site every day for signs of infection, including redness, swelling, pain, or warmth. This information is not intended to replace advice given to you by your health care provider. Make sure you discuss any questions you have with your health care provider. Document Revised: 09/08/2020 Document Reviewed: 07/05/2017 Elsevier Patient Education  2022 [employer]  

## 2021-03-01 ENCOUNTER — Other Ambulatory Visit: Payer: Self-pay

## 2021-03-01 ENCOUNTER — Inpatient Hospital Stay: Payer: Medicare HMO

## 2021-03-01 ENCOUNTER — Other Ambulatory Visit: Payer: Self-pay | Admitting: Hematology

## 2021-03-01 ENCOUNTER — Inpatient Hospital Stay (HOSPITAL_BASED_OUTPATIENT_CLINIC_OR_DEPARTMENT_OTHER): Payer: Medicare HMO | Admitting: Hematology

## 2021-03-01 VITALS — BP 123/59 | HR 89 | Temp 98.2°F | Resp 18 | Wt 158.8 lb

## 2021-03-01 DIAGNOSIS — D696 Thrombocytopenia, unspecified: Secondary | ICD-10-CM

## 2021-03-01 DIAGNOSIS — E538 Deficiency of other specified B group vitamins: Secondary | ICD-10-CM | POA: Diagnosis not present

## 2021-03-01 DIAGNOSIS — R519 Headache, unspecified: Secondary | ICD-10-CM | POA: Diagnosis not present

## 2021-03-01 DIAGNOSIS — Z79899 Other long term (current) drug therapy: Secondary | ICD-10-CM | POA: Diagnosis not present

## 2021-03-01 DIAGNOSIS — C9310 Chronic myelomonocytic leukemia not having achieved remission: Secondary | ICD-10-CM | POA: Diagnosis not present

## 2021-03-01 DIAGNOSIS — I10 Essential (primary) hypertension: Secondary | ICD-10-CM | POA: Diagnosis not present

## 2021-03-01 DIAGNOSIS — Z452 Encounter for adjustment and management of vascular access device: Secondary | ICD-10-CM

## 2021-03-01 DIAGNOSIS — E1142 Type 2 diabetes mellitus with diabetic polyneuropathy: Secondary | ICD-10-CM | POA: Diagnosis not present

## 2021-03-01 DIAGNOSIS — D649 Anemia, unspecified: Secondary | ICD-10-CM | POA: Diagnosis not present

## 2021-03-01 DIAGNOSIS — D693 Immune thrombocytopenic purpura: Secondary | ICD-10-CM | POA: Diagnosis not present

## 2021-03-01 DIAGNOSIS — Z5112 Encounter for antineoplastic immunotherapy: Secondary | ICD-10-CM | POA: Diagnosis not present

## 2021-03-01 DIAGNOSIS — M4724 Other spondylosis with radiculopathy, thoracic region: Secondary | ICD-10-CM | POA: Diagnosis not present

## 2021-03-01 DIAGNOSIS — R1012 Left upper quadrant pain: Secondary | ICD-10-CM | POA: Diagnosis not present

## 2021-03-01 DIAGNOSIS — Z5111 Encounter for antineoplastic chemotherapy: Secondary | ICD-10-CM | POA: Diagnosis not present

## 2021-03-01 DIAGNOSIS — Z7984 Long term (current) use of oral hypoglycemic drugs: Secondary | ICD-10-CM | POA: Diagnosis not present

## 2021-03-01 LAB — CBC WITH DIFFERENTIAL (CANCER CENTER ONLY)
Abs Immature Granulocytes: 0.36 10*3/uL — ABNORMAL HIGH (ref 0.00–0.07)
Basophils Absolute: 0 10*3/uL (ref 0.0–0.1)
Basophils Relative: 1 %
Eosinophils Absolute: 0 10*3/uL (ref 0.0–0.5)
Eosinophils Relative: 0 %
HCT: 27.9 % — ABNORMAL LOW (ref 36.0–46.0)
Hemoglobin: 8.8 g/dL — ABNORMAL LOW (ref 12.0–15.0)
Immature Granulocytes: 5 %
Lymphocytes Relative: 10 %
Lymphs Abs: 0.7 10*3/uL (ref 0.7–4.0)
MCH: 27.8 pg (ref 26.0–34.0)
MCHC: 31.5 g/dL (ref 30.0–36.0)
MCV: 88.3 fL (ref 80.0–100.0)
Monocytes Absolute: 1.4 10*3/uL — ABNORMAL HIGH (ref 0.1–1.0)
Monocytes Relative: 21 %
Neutro Abs: 4.3 10*3/uL (ref 1.7–7.7)
Neutrophils Relative %: 63 %
Platelet Count: 10 10*3/uL — ABNORMAL LOW (ref 150–400)
RBC: 3.16 MIL/uL — ABNORMAL LOW (ref 3.87–5.11)
RDW: 17.7 % — ABNORMAL HIGH (ref 11.5–15.5)
WBC Count: 6.7 10*3/uL (ref 4.0–10.5)
nRBC: 0 % (ref 0.0–0.2)

## 2021-03-01 LAB — CMP (CANCER CENTER ONLY)
ALT: 16 U/L (ref 0–44)
AST: 18 U/L (ref 15–41)
Albumin: 3.5 g/dL (ref 3.5–5.0)
Alkaline Phosphatase: 140 U/L — ABNORMAL HIGH (ref 38–126)
Anion gap: 8 (ref 5–15)
BUN: 16 mg/dL (ref 8–23)
CO2: 24 mmol/L (ref 22–32)
Calcium: 8.9 mg/dL (ref 8.9–10.3)
Chloride: 108 mmol/L (ref 98–111)
Creatinine: 0.85 mg/dL (ref 0.44–1.00)
GFR, Estimated: >60 mL/min (ref >60)
Glucose, Bld: 182 mg/dL — ABNORMAL HIGH (ref 70–99)
Potassium: 4.1 mmol/L (ref 3.5–5.1)
Sodium: 140 mmol/L (ref 135–145)
Total Bilirubin: 0.7 mg/dL (ref 0.3–1.2)
Total Protein: 7.8 g/dL (ref 6.5–8.1)

## 2021-03-01 LAB — BPAM PLATELET PHERESIS
Blood Product Expiration Date: 202209172359
ISSUE DATE / TIME: 202209160956
Unit Type and Rh: 5100

## 2021-03-01 LAB — PREPARE PLATELET PHERESIS: Unit division: 0

## 2021-03-01 MED ORDER — SODIUM CHLORIDE 0.9 % IV SOLN
75.0000 mg/m2 | Freq: Once | INTRAVENOUS | Status: AC
  Administered 2021-03-01: 135 mg via INTRAVENOUS
  Filled 2021-03-01: qty 13.5

## 2021-03-01 MED ORDER — ROMIPLOSTIM INJECTION 500 MCG
750.0000 ug | Freq: Once | SUBCUTANEOUS | Status: AC
  Administered 2021-03-01: 750 ug via SUBCUTANEOUS
  Filled 2021-03-01: qty 1

## 2021-03-01 MED ORDER — SODIUM CHLORIDE 0.9 % IV SOLN: Freq: Once | INTRAVENOUS | Status: AC

## 2021-03-01 MED ORDER — SODIUM CHLORIDE 0.9% FLUSH: 10.0000 mL | Freq: Once | INTRAVENOUS | Status: DC

## 2021-03-01 MED ORDER — SODIUM CHLORIDE 0.9% FLUSH
10.0000 mL | INTRAVENOUS | Status: DC | PRN
  Administered 2021-03-01: 10 mL

## 2021-03-01 MED ORDER — SODIUM CHLORIDE 0.9 % IV SOLN
10.0000 mg | Freq: Once | INTRAVENOUS | Status: AC
  Administered 2021-03-01: 10 mg via INTRAVENOUS
  Filled 2021-03-01: qty 10

## 2021-03-01 MED ORDER — PALONOSETRON HCL INJECTION 0.25 MG/5ML
0.2500 mg | Freq: Once | INTRAVENOUS | Status: AC
  Administered 2021-03-01: 0.25 mg via INTRAVENOUS
  Filled 2021-03-01: qty 5

## 2021-03-01 MED ORDER — HEPARIN SOD (PORK) LOCK FLUSH 100 UNIT/ML IV SOLN
250.0000 [IU] | Freq: Once | INTRAVENOUS | Status: AC | PRN
  Administered 2021-03-01: 250 [IU]

## 2021-03-01 MED ORDER — SODIUM CHLORIDE 0.9% IV SOLUTION
250.0000 mL | Freq: Once | INTRAVENOUS | Status: AC
  Administered 2021-03-01: 250 mL via INTRAVENOUS

## 2021-03-01 MED FILL — Dexamethasone Sodium Phosphate Inj 100 MG/10ML: INTRAMUSCULAR | Qty: 1 | Status: AC

## 2021-03-01 NOTE — Patient Instructions (Addendum)
[facility] ONCOLOGY  Discharge Instructions: Thank you for choosing [facility] to provide your oncology and hematology care.   If you have a lab appointment with the [facility], please go directly to the [facility] and check in at the registration area.   Wear comfortable clothing and clothing appropriate for easy access to any Portacath or PICC line.   We strive to give you quality time with your provider. You may need to reschedule your appointment if you arrive late (15 or more minutes).  Arriving late affects you and other patients whose appointments are after yours.  Also, if you miss three or more appointments without notifying the office, you may be dismissed from the clinic at the provider's discretion.      For prescription refill requests, have your pharmacy contact our office and allow 72 hours for refills to be completed.    Today you received the following chemotherapy and/or immunotherapy agents Vidaza  And platelet transfusion    To help prevent nausea and vomiting after your treatment, we encourage you to take your nausea medication as directed.  BELOW ARE SYMPTOMS THAT SHOULD BE REPORTED IMMEDIATELY: *FEVER GREATER THAN 100.4 F (38 C) OR HIGHER *CHILLS OR SWEATING *NAUSEA AND VOMITING THAT IS NOT CONTROLLED WITH YOUR NAUSEA MEDICATION *UNUSUAL SHORTNESS OF BREATH *UNUSUAL BRUISING OR BLEEDING *URINARY PROBLEMS (pain or burning when urinating, or frequent urination) *BOWEL PROBLEMS (unusual diarrhea, constipation, pain near the anus) TENDERNESS IN MOUTH AND THROAT WITH OR WITHOUT PRESENCE OF ULCERS (sore throat, sores in mouth, or a toothache) UNUSUAL RASH, SWELLING OR PAIN  UNUSUAL VAGINAL DISCHARGE OR ITCHING   Items with * indicate a potential emergency and should be followed up as soon as possible or go to the Emergency Department if any problems should occur.  Please show the CHEMOTHERAPY ALERT CARD or IMMUNOTHERAPY ALERT  CARD at check-in to the Emergency Department and triage nurse.  Should you have questions after your visit or need to cancel or reschedule your appointment, please contact [facility]  Dept: [phone]  and follow the prompts.  Office hours are 8:00 a.m. to 4:30 p.m. Monday - Friday. Please note that voicemails left after 4:00 p.m. may not be returned until the following business day.  We are closed weekends and major holidays. You have access to a nurse at all times for urgent questions. Please call the main number to the clinic Dept: [phone] and follow the prompts.   For any non-urgent questions, you may also contact your provider using MyChart. We now offer e-Visits for anyone 11 and older to request care online for non-urgent symptoms. For details visit [email or web address].   Also download the MyChart app! Go to the app store, search "MyChart", open the app, select [facility], and log in with your MyChart username and password.  Due to Covid, a mask is required upon entering the hospital/clinic. If you do not have a mask, one will be given to you upon arrival. For doctor visits, patients may have 1 support person aged 42 or older with them. For treatment visits, patients cannot have anyone with them due to current Covid guidelines and our immunocompromised population.

## 2021-03-01 NOTE — Progress Notes (Signed)
This nurse spoke entered platelet transfusion orders for this patient for 919/22.  Nurse spoke with Beth in blood bank and was informed tht the HLA matched platelets are still in transit from [place] and will not arrive to [facility] until 4pm Today.  Also sated that they have some Random platelets ready for patients.  This nurse acknowledged understanding and informed MD.  No further questions at this time.

## 2021-03-01 NOTE — Progress Notes (Signed)
[place]   Telephone:(336) [phone] Fax:(336) [phone]   Clinic Follow up Note   Patient Care Team: [name], DO as PCP - General (Family Medicine) Loletha Carrow Kirke Corin, MD as Consulting Physician (Gastroenterology) Truitt Merle, MD as Consulting Physician (Hematology) Alda Berthold, DO as Consulting Physician (Neurology)  Date of Service:  03/01/2021  CHIEF COMPLAINT: f/u of [name], severe thrombocytopenia  CURRENT THERAPY:  Azacitidine days 1-5 q28 days, started 12/28/20 Nplate 40mcg/kg weekly  Platelet transfusion as needed (plt<20K)  ASSESSMENT & PLAN:  Kathleen Gibson is a 70 y.o. female with   1. [name] with severe thrombocytopenia  -She was initially diagnosed with ITP in 2014. She lost follow up after 11/2017 and did not proceed with recommended bone marrow biopsy. -She was referred to ED 10/28/20 by her PCP for low plt count of 4k. She was hospitalized and treated with platelet transfusions, IVIG, oral prednisone $RemoveBeforeD'60mg'[id number]$  and Nplate injections. She tapered off prednisone given little response. -Bone marrow biopsy 10/30/20 felt to likely represent MDS/MPN, particularly [name]. Confirmed diagnosis of [name] on slide review at [facility] by Dr. Linus Orn -She received treatment for severe refractory thrombocytopenia with weekly Rituxan x4 on 11/19/20, but she did not respond  -She began azacitidine daily days 1-5 q. 28 days on 12/28/20, s/p 2 cycles  -She had worsening thrombocytopenia after stopping Nplate, restarted weekly Nplate 10 mcg/kg on 4/62/7035 -Her thrombocytopenia did improve after first cycle chemo, but it dropped significantly after second cycle -Labs reviewed, plt 10k today, will proceed with plt transfusion, Nplate, and start cycle 3 azacitadine today. -she is scheduled for BM biopsy at [facility] in 2 weeks    2. Normocytic anemia, moderate  -h/o pernicious anemia/vitamin B12 deficiency -with recent severe thrombocytopenia, she has had loq ferritin and iron  study with moderate anemia. She required blood transfusion in hospital. Will give blood transfusion for Hg <=7.5.  -Hg 8.2 today (02/22/21), no need blood transfusion    3. Left UQ abdominal pain, back pain -abdominal pain likely secondary to splenomegaly. Currently tolerable, continue monitoring. -back pain began in early 01/2021. Now constant and worsens with movement. She notes the flexeril did not help. I prescribed tylenol 3 on 02/22/21. She has not noticed much of a difference -She is scheduled for thoracic spine MRI on 03/05/21.   4. Headache -no other neurological symptoms, will continue monitor clinically, take Tylenol as needed   5. Type 2 diabetes mellitus with polyneuropathy, Hypertension -Continue medication, she will follow-up with her family doctor     PLAN:  -proceed with platelet transfusion, Nplate, and C3 Azacitidine fusion (days 1-5 of 28) today -thoacic spine MRI 03/05/21 -she will get HLA matched plt transfusion on Wednesday  -labs and Nplate weekly on Mondays -lab and plt transfusion every Monday and Thursday  -f/u in a week    No problem-specific Assessment & Plan notes found for this encounter.   SUMMARY OF ONCOLOGIC HISTORY: Oncology History  [name] (chronic myelomonocytic leukemia) ([place])  10/29/2020 Imaging   CT CAP  IMPRESSION: 1. Splenomegaly with pathologically enlarged lymph nodes above and below the diaphragm, with overall stable to minimally increased abdominal adenopathy and interval progression of the pelvic adenopathy. 2. Small volume abdominopelvic ascites with diffuse mesenteric stranding. 3. Scattered bilateral pulmonary micro nodules measuring 1-2 mm. 4. Distended gallbladder with some layering hyperdense material representing layering sludge and tiny stones seen on prior ultrasound. 5. Aortic atherosclerosis.   10/30/2020 Pathology Results   DIAGNOSIS:  BONE MARROW, ASPIRATE, CLOT, CORE:  -  Hypercellular bone marrow with panhyperplasia,  atypia, and no  increase in blasts  -  See comment   PERIPHERAL BLOOD:  -  Marked thrombocytopenia  -  Absolute monocytosis  -  Normocytic anemia  -  See CBC data and comment   COMMENT:  The bone marrow is hypercellular for age (approximately 80%) with myeloid hyperplasia with maturational left shift, erythroid hyperplasia, and increased megakaryocytes.  Mild multilineage atypia is present. Blasts are not increased on aspirate smears (1% by manual differential count) or by CD34 immunohistochemistry on the core biopsy.  Concurrent flow cytometric analysis of the bone marrow aspirate demonstrates increased monocytes, and no increase in blasts or abnormal lymphoid population (see [id number]).  Monocytes are also increased in peripheral  blood, persistent per electronic medical record.  In aggregate, the  findings raise the possibility of a myeloid neoplasm with the  differential diagnosis including a low-grade myelodysplastic syndrome and chronic myelomonocytic leukemia (dysplastic type).    ADDENDUM:  A reticulin special stain performed on the bone marrow core biopsy reveals no significant increase in reticulin fibrosis.  ADDENDUM:  CYTOGENETIC RESULTS:  Karyotype: 46,XX[20]  Interpretation: NORMAL FEMALE KARYOTYPE   FISH RESULTS:  Results: NORMAL   ADDENDUM:  CD123 immunohistochemistry performed on the core biopsy highlights scattered aggregates of positively staining cells consistent with plasmacytoid dendritic cells.    10/30/2020 Pathology Results   DIAGNOSIS:   BONE MARROW; FLOW CYTOMETRIC ANALYSIS:  -  Increased monocytes  -  Scant B-cells present  -  No immunophenotypically aberrant T-cell population identified  -  No increase in blasts  -  See comment   COMMENT:  Monocytes are relatively increased (12% of all cells), without aberrant expression of CD56.  B-cells comprise <1% of total lymphocytes. CD34-positive blasts are not increased (<1% of all cells).  Correlation  with concurrent morphology is recommended for complete diagnostic interpretation and overall blast enumeration (see [id number]).    10/30/2020 Pathology Results   FINAL MICROSCOPIC DIAGNOSIS:   A. LYMPH NODE, RIGHT AXILLARY, NEEDLE CORE BIOPSY:  -Lymphoid tissue present  -See comment   COMMENT:  The sections show several small needle core biopsy fragments of lymphoid tissue displaying degenerative cellular changes/necrosis and hence cannot be accurately evaluated.  Sample for flow cytometric analysis not available.  Immunohistochemical stain for CD3 and CD20 were performed with appropriate controls.  There is a mixture of T and B cells in their apparently respective compartments.  There is no definite metastatic malignancy.    11/11/2020 Pathology Results   DIAGNOSIS:   LEFT AXILLARY LYMPH NODE EXCISIONAL BIOPSY; FLOW CYTOMETRIC ANALYSIS:  -  No monotypic B-cell or immunophenotypically aberrant T-cell  population identified  -  See comment   COMMENT:  Flow cytometric analysis identifies B-cells with a normal kappa:lambda ratio of 1.7:1.  A subset of the polytypic B-cells expresses CD10. T-cells show a CD4:CD8 ratio of 3.3:1 without immunophenotypic aberrancy with the markers evaluated.  Although these results do not support the diagnosis of a clonal lymphoid population, sampling issues must always be considered when negative results are obtained, as focal lesions may not be represented in the specimen submitted.  In addition, flow cytometric immunophenotyping will not exclude other pathology if present (e.g. Hodgkin lymphoma, some T-cell lymphomas, metastatic and infectious diseases).   11/11/2020 Pathology Results   FINAL MICROSCOPIC DIAGNOSIS:   A. LYMPH NODE, LEFT AXILLARY, DISSECTION:  -  Follicular hyperplasia with interfollicular expansion  -  See comment  COMMENT:  Sections of the lymph nodes reveal generally preserved lymph node architecture.  There is follicular hyperplasia with  some follicles showing increased hyalinization of germinal centers and mild concentric encircling of germinal centers with small lymphocytes (onion skinning). Interfollicular areas are expanded with increased vascular proliferation, small lymphocytes, plasma cells, histiocytic cells, and patchy neutrophils.  The lymph node capsule is variably thickened by fibrosis with few plasma cells.   A panel of immunohistochemical stains is performed for further  characterization.  CD3 and CD20/PAX5 highlight T-cell and B-cell  compartments, respectively.  Germinal centers are highlighted by CD10 and BCL6 with appropriate absent expression of BCL2.  CD5 is similar to CD3.  CD43 also stains the T-cells.  CD4-positive T-cells exceed CD8-positive T-cells.  CD30 highlights scattered immunoblasts.  CD21 reveals intact follicular dendritic cell meshworks.  CD68 highlights increased histiocytic cells.  HHV 8 is negative.  T. pallidum reveals no definitive organisms.  Pancytokeratin is negative.  TdT stains rare cells.  CD138 highlights plasma cells which are not increased in number and show polytypic light chain expression by kappa/lambda in situ  hybridization.  EBV is negative by in situ hybridization.   Concurrent flow cytometric analysis is negative for a monoclonal B-cell or immunophenotypically aberrant T-cell population (see [phone]).   Together, the findings above are non-specific and can be seen in  reactive and infectious processes as well as [place]man disease.  There is no definitive morphologic or flow cytometric evidence of involvement by a lymphoproliferative disorder from the current workup.   12/17/2020 Initial Diagnosis   [name] (chronic myelomonocytic leukemia) ([place])   12/28/2020 -  Chemotherapy    Patient is on Treatment Plan: MYELODYSPLASIA  AZACITIDINE IV D1-5 Q28D          INTERVAL HISTORY:  Kathleen Gibson is here for a follow up of [name], severe thrombocytopenia. She was last seen by me on  02/22/21. She was seen in the infusion area. She reports doing okay overall. She notes she recently had a skin biopsy on her arm, but she has not yet heard the results. She also reports her back continues to bother her, especially at night.    All other systems were reviewed with the patient and are negative.  MEDICAL HISTORY:  Past Medical History:  Diagnosis Date   Diabetes ([place])    HLD (hyperlipidemia)    Hypertension    Pernicious anemia    Pneumonia 09/05/2019   Renal insufficiency 09/06/2019   Vitamin D deficiency     SURGICAL HISTORY: Past Surgical History:  Procedure Laterality Date   ABDOMINAL HERNIA REPAIR  2009   ABDOMINOPLASTY     AXILLARY LYMPH NODE BIOPSY Left 11/11/2020   Procedure: LEFT AXILLARY EXCISIONAL LYMPH NODE BIOPSY;  Surgeon: [name], MD;  Location: [facility] OR;  Service: General;  Laterality: Left;   [place]   hemorroid surgery  2007   [place]    I have reviewed the social history and family history with the patient and they are unchanged from previous note.  ALLERGIES:  is allergic to asa [aspirin] and nsaids.  MEDICATIONS:  Current Outpatient Medications  Medication Sig Dispense Refill   acetaminophen-codeine (TYLENOL #3) 300-30 MG tablet Take 1 tablet by mouth every 8 (eight) hours as needed for moderate pain. 15 tablet 0   cyclobenzaprine (FLEXERIL) 5 MG tablet Take 1 tablet (5 mg total) by mouth every 8 (eight) hours as needed for muscle  spasms. 30 tablet 0   ferrous sulfate 325 (65 FE) MG tablet Take 2 tablets (650 mg total) by mouth daily with breakfast. 180 tablet 3   gabapentin (NEURONTIN) 300 MG capsule Take 2 capsules (600 mg total) by mouth 2 (two) times daily. 360 capsule 1   glucose monitoring kit (FREESTYLE) monitoring kit 1 each by Does not apply route as needed for other. Use as directed 1 each 0   LORazepam (ATIVAN) 1 MG tablet Take 1 tablet (1 mg total) by mouth once as  needed for up to 1 dose for anxiety. Tale 1 tab 1 hour before MRI, and OK to take second tab right before MRI if needed 30 tablet 0   metoprolol succinate (TOPROL-XL) 50 MG 24 hr tablet Take 1 tablet (50 mg total) by mouth daily. 90 tablet 1   sitaGLIPtin-metformin (JANUMET) 50-1000 MG tablet Take 1 tablet by mouth at bedtime. 90 tablet 1   Vitamin D, Cholecalciferol, 25 MCG (1000 UT) CAPS Take 1,000 Units by mouth daily.      zolpidem (AMBIEN) 5 MG tablet Take 1 tablet (5 mg total) by mouth at bedtime. 90 tablet 1   No current facility-administered medications for this visit.   Facility-Administered Medications Ordered in Other Visits  Medication Dose Route Frequency Provider Last Rate Last Admin   0.9 %  sodium chloride infusion (Manually program via Guardrails IV Fluids)  250 mL Intravenous Once Truitt Merle, MD       [facility]) 135 mg in sodium chloride 0.9 % 50 mL chemo infusion  75 mg/m2 (Treatment Plan Recorded) Intravenous Once Truitt Merle, MD       heparin lock flush 100 unit/mL  250 Units Intracatheter Once PRN Truitt Merle, MD       romiPLOStim (NPLATE) injection 720 mcg  10 mcg/kg Subcutaneous Once Truitt Merle, MD       sodium chloride flush (NS) 0.9 % injection 10 mL  10 mL Intracatheter PRN Truitt Merle, MD        PHYSICAL EXAMINATION: ECOG PERFORMANCE STATUS: 2 - Symptomatic, <50% confined to bed  There were no vitals filed for this visit. Wt Readings from Last 3 Encounters:  03/01/21 158 lb 12 oz (72 kg)  02/11/21 156 lb 11.2 oz (71.1 kg)  01/29/21 164 lb 12.8 oz (74.8 kg)     GENERAL:alert, no distress and comfortable SKIN: skin color normal, no rashes or significant lesions EYES: normal, Conjunctiva are pink and non-injected, sclera clear  NEURO: alert & oriented x 3 with fluent speech  LABORATORY DATA:  I have reviewed the data as listed CBC Latest Ref Rng & Units 03/01/2021 02/26/2021 02/22/2021  WBC 4.0 - 10.5 K/uL 6.7 5.6 5.1  Hemoglobin 12.0 - 15.0 g/dL 8.8(L)  8.5(L) 8.3(L)  Hematocrit 36.0 - 46.0 % 27.9(L) 27.8(L) 26.1(L)  Platelets 150 - 400 K/uL 10(L) <5(LL) 5(LL)     CMP Latest Ref Rng & Units 03/01/2021 02/26/2021 02/22/2021  Glucose 70 - 99 mg/dL 182(H) 226(H) 250(H)  BUN 8 - 23 mg/dL $Remove'16 15 13  'lMxHUlk$ Creatinine 0.44 - 1.00 mg/dL 0.85 0.87 0.82  Sodium 135 - 145 mmol/L 140 138 139  Potassium 3.5 - 5.1 mmol/L 4.1 3.9 4.4  Chloride 98 - 111 mmol/L 108 105 106  CO2 22 - 32 mmol/L $RemoveB'24 25 23  'DUyuhttp$ Calcium 8.9 - 10.3 mg/dL 8.9 8.8(L) 8.7(L)  Total Protein 6.5 - 8.1 g/dL 7.8 7.6 7.3  Total Bilirubin 0.3 - 1.2 mg/dL 0.7 0.7 0.8  Alkaline Phos  38 - 126 U/L 140(H) 128(H) 130(H)  AST 15 - 41 U/L 18 13(L) 14(L)  ALT 0 - 44 U/L $Remo'16 12 16      'dHkuc$ RADIOGRAPHIC STUDIES: I have personally reviewed the radiological images as listed and agreed with the findings in the report. No results found.    No orders of the defined types were placed in this encounter.  All questions were answered. The patient knows to call the clinic with any problems, questions or concerns. No barriers to learning was detected. The total time spent in the appointment was 30 minutes.     Truitt Merle, MD 03/01/2021   I, Wilburn Mylar, am acting as scribe for Truitt Merle, MD.   I have reviewed the above documentation for accuracy and completeness, and I agree with the above.

## 2021-03-02 ENCOUNTER — Inpatient Hospital Stay: Payer: Medicare HMO

## 2021-03-02 ENCOUNTER — Encounter: Payer: Self-pay | Admitting: Hematology

## 2021-03-02 VITALS — BP 143/54 | HR 115 | Temp 98.7°F | Resp 18

## 2021-03-02 DIAGNOSIS — Z5111 Encounter for antineoplastic chemotherapy: Secondary | ICD-10-CM | POA: Diagnosis not present

## 2021-03-02 DIAGNOSIS — C9310 Chronic myelomonocytic leukemia not having achieved remission: Secondary | ICD-10-CM

## 2021-03-02 DIAGNOSIS — D649 Anemia, unspecified: Secondary | ICD-10-CM | POA: Diagnosis not present

## 2021-03-02 DIAGNOSIS — I10 Essential (primary) hypertension: Secondary | ICD-10-CM | POA: Diagnosis not present

## 2021-03-02 DIAGNOSIS — D693 Immune thrombocytopenic purpura: Secondary | ICD-10-CM | POA: Diagnosis not present

## 2021-03-02 DIAGNOSIS — Z5112 Encounter for antineoplastic immunotherapy: Secondary | ICD-10-CM | POA: Diagnosis not present

## 2021-03-02 DIAGNOSIS — M4724 Other spondylosis with radiculopathy, thoracic region: Secondary | ICD-10-CM | POA: Diagnosis not present

## 2021-03-02 DIAGNOSIS — R519 Headache, unspecified: Secondary | ICD-10-CM | POA: Diagnosis not present

## 2021-03-02 DIAGNOSIS — E538 Deficiency of other specified B group vitamins: Secondary | ICD-10-CM | POA: Diagnosis not present

## 2021-03-02 DIAGNOSIS — Z7984 Long term (current) use of oral hypoglycemic drugs: Secondary | ICD-10-CM | POA: Diagnosis not present

## 2021-03-02 DIAGNOSIS — E1142 Type 2 diabetes mellitus with diabetic polyneuropathy: Secondary | ICD-10-CM | POA: Diagnosis not present

## 2021-03-02 DIAGNOSIS — Z79899 Other long term (current) drug therapy: Secondary | ICD-10-CM | POA: Diagnosis not present

## 2021-03-02 DIAGNOSIS — R1012 Left upper quadrant pain: Secondary | ICD-10-CM | POA: Diagnosis not present

## 2021-03-02 MED ORDER — SODIUM CHLORIDE 0.9 % IV SOLN
10.0000 mg | Freq: Once | INTRAVENOUS | Status: AC
  Administered 2021-03-02: 10 mg via INTRAVENOUS
  Filled 2021-03-02: qty 10

## 2021-03-02 MED ORDER — HEPARIN SOD (PORK) LOCK FLUSH 100 UNIT/ML IV SOLN
250.0000 [IU] | Freq: Once | INTRAVENOUS | Status: AC | PRN
  Administered 2021-03-02: 250 [IU]

## 2021-03-02 MED ORDER — SODIUM CHLORIDE 0.9% FLUSH
10.0000 mL | INTRAVENOUS | Status: DC | PRN
  Administered 2021-03-02: 10 mL

## 2021-03-02 MED ORDER — SODIUM CHLORIDE 0.9 % IV SOLN: Freq: Once | INTRAVENOUS | Status: AC

## 2021-03-02 MED ORDER — AZACITIDINE CHEMO INJECTION 100 MG
75.0000 mg/m2 | Freq: Once | INTRAMUSCULAR | Status: AC
  Administered 2021-03-02: 135 mg via INTRAVENOUS
  Filled 2021-03-02: qty 13.5

## 2021-03-02 MED FILL — Dexamethasone Sodium Phosphate Inj 100 MG/10ML: INTRAMUSCULAR | Qty: 1 | Status: AC

## 2021-03-02 NOTE — Progress Notes (Signed)
OK to treat w/ HR 115 per Dr. Burr Medico w/ [facility].

## 2021-03-03 ENCOUNTER — Other Ambulatory Visit: Payer: Self-pay

## 2021-03-03 ENCOUNTER — Inpatient Hospital Stay: Payer: Medicare HMO

## 2021-03-03 VITALS — BP 124/85 | HR 90 | Temp 97.8°F | Resp 17

## 2021-03-03 DIAGNOSIS — E538 Deficiency of other specified B group vitamins: Secondary | ICD-10-CM | POA: Diagnosis not present

## 2021-03-03 DIAGNOSIS — D693 Immune thrombocytopenic purpura: Secondary | ICD-10-CM | POA: Diagnosis not present

## 2021-03-03 DIAGNOSIS — Z79899 Other long term (current) drug therapy: Secondary | ICD-10-CM | POA: Diagnosis not present

## 2021-03-03 DIAGNOSIS — D649 Anemia, unspecified: Secondary | ICD-10-CM | POA: Diagnosis not present

## 2021-03-03 DIAGNOSIS — R519 Headache, unspecified: Secondary | ICD-10-CM | POA: Diagnosis not present

## 2021-03-03 DIAGNOSIS — I10 Essential (primary) hypertension: Secondary | ICD-10-CM | POA: Diagnosis not present

## 2021-03-03 DIAGNOSIS — Z7984 Long term (current) use of oral hypoglycemic drugs: Secondary | ICD-10-CM | POA: Diagnosis not present

## 2021-03-03 DIAGNOSIS — M4724 Other spondylosis with radiculopathy, thoracic region: Secondary | ICD-10-CM | POA: Diagnosis not present

## 2021-03-03 DIAGNOSIS — Z5112 Encounter for antineoplastic immunotherapy: Secondary | ICD-10-CM | POA: Diagnosis not present

## 2021-03-03 DIAGNOSIS — C9310 Chronic myelomonocytic leukemia not having achieved remission: Secondary | ICD-10-CM

## 2021-03-03 DIAGNOSIS — Z5111 Encounter for antineoplastic chemotherapy: Secondary | ICD-10-CM | POA: Diagnosis not present

## 2021-03-03 DIAGNOSIS — E1142 Type 2 diabetes mellitus with diabetic polyneuropathy: Secondary | ICD-10-CM | POA: Diagnosis not present

## 2021-03-03 DIAGNOSIS — R1012 Left upper quadrant pain: Secondary | ICD-10-CM | POA: Diagnosis not present

## 2021-03-03 MED ORDER — HEPARIN SOD (PORK) LOCK FLUSH 100 UNIT/ML IV SOLN
500.0000 [IU] | Freq: Once | INTRAVENOUS | Status: AC | PRN
  Administered 2021-03-03: 500 [IU]

## 2021-03-03 MED ORDER — SODIUM CHLORIDE 0.9 % IV SOLN: Freq: Once | INTRAVENOUS | Status: AC

## 2021-03-03 MED ORDER — SODIUM CHLORIDE 0.9 % IV SOLN
75.0000 mg/m2 | Freq: Once | INTRAVENOUS | Status: AC
  Administered 2021-03-03: 135 mg via INTRAVENOUS
  Filled 2021-03-03: qty 13.5

## 2021-03-03 MED ORDER — SODIUM CHLORIDE 0.9 % IV SOLN
10.0000 mg | Freq: Once | INTRAVENOUS | Status: AC
  Administered 2021-03-03: 10 mg via INTRAVENOUS
  Filled 2021-03-03: qty 10

## 2021-03-03 MED ORDER — PALONOSETRON HCL INJECTION 0.25 MG/5ML
0.2500 mg | Freq: Once | INTRAVENOUS | Status: AC
  Administered 2021-03-03: 0.25 mg via INTRAVENOUS
  Filled 2021-03-03: qty 5

## 2021-03-03 MED ORDER — SODIUM CHLORIDE 0.9% FLUSH
10.0000 mL | INTRAVENOUS | Status: DC | PRN
  Administered 2021-03-03: 10 mL

## 2021-03-03 MED FILL — Dexamethasone Sodium Phosphate Inj 100 MG/10ML: INTRAMUSCULAR | Qty: 1 | Status: AC

## 2021-03-03 NOTE — Patient Instructions (Signed)
[facility] ONCOLOGY  Discharge Instructions: Thank you for choosing [facility] to provide your oncology and hematology care.   If you have a lab appointment with the [facility], please go directly to the [facility] and check in at the registration area.   Wear comfortable clothing and clothing appropriate for easy access to any Portacath or PICC line.   We strive to give you quality time with your provider. You may need to reschedule your appointment if you arrive late (15 or more minutes).  Arriving late affects you and other patients whose appointments are after yours.  Also, if you miss three or more appointments without notifying the office, you may be dismissed from the clinic at the provider's discretion.      For prescription refill requests, have your pharmacy contact our office and allow 72 hours for refills to be completed.    Today you received the following chemotherapy and/or immunotherapy agents Azacitidine Infusion      To help prevent nausea and vomiting after your treatment, we encourage you to take your nausea medication as directed.  BELOW ARE SYMPTOMS THAT SHOULD BE REPORTED IMMEDIATELY: *FEVER GREATER THAN 100.4 F (38 C) OR HIGHER *CHILLS OR SWEATING *NAUSEA AND VOMITING THAT IS NOT CONTROLLED WITH YOUR NAUSEA MEDICATION *UNUSUAL SHORTNESS OF BREATH *UNUSUAL BRUISING OR BLEEDING *URINARY PROBLEMS (pain or burning when urinating, or frequent urination) *BOWEL PROBLEMS (unusual diarrhea, constipation, pain near the anus) TENDERNESS IN MOUTH AND THROAT WITH OR WITHOUT PRESENCE OF ULCERS (sore throat, sores in mouth, or a toothache) UNUSUAL RASH, SWELLING OR PAIN  UNUSUAL VAGINAL DISCHARGE OR ITCHING   Items with * indicate a potential emergency and should be followed up as soon as possible or go to the Emergency Department if any problems should occur.  Please show the CHEMOTHERAPY ALERT CARD or IMMUNOTHERAPY ALERT CARD at  check-in to the Emergency Department and triage nurse.  Should you have questions after your visit or need to cancel or reschedule your appointment, please contact [facility]  Dept: [phone]  and follow the prompts.  Office hours are 8:00 a.m. to 4:30 p.m. Monday - Friday. Please note that voicemails left after 4:00 p.m. may not be returned until the following business day.  We are closed weekends and major holidays. You have access to a nurse at all times for urgent questions. Please call the main number to the clinic Dept: [phone] and follow the prompts.   For any non-urgent questions, you may also contact your provider using MyChart. We now offer e-Visits for anyone 61 and older to request care online for non-urgent symptoms. For details visit [email or web address].   Also download the MyChart app! Go to the app store, search "MyChart", open the app, select [facility], and log in with your MyChart username and password.  Due to Covid, a mask is required upon entering the hospital/clinic. If you do not have a mask, one will be given to you upon arrival. For doctor visits, patients may have 1 support person aged 18 or older with them. For treatment visits, patients cannot have anyone with them due to current Covid guidelines and our immunocompromised population.

## 2021-03-04 ENCOUNTER — Inpatient Hospital Stay: Payer: Medicare HMO

## 2021-03-04 ENCOUNTER — Other Ambulatory Visit: Payer: Self-pay

## 2021-03-04 ENCOUNTER — Telehealth: Payer: Self-pay | Admitting: Family Medicine

## 2021-03-04 ENCOUNTER — Telehealth: Payer: Self-pay

## 2021-03-04 VITALS — BP 123/64 | HR 95 | Temp 98.3°F | Resp 17

## 2021-03-04 DIAGNOSIS — Z5111 Encounter for antineoplastic chemotherapy: Secondary | ICD-10-CM | POA: Diagnosis not present

## 2021-03-04 DIAGNOSIS — Z5112 Encounter for antineoplastic immunotherapy: Secondary | ICD-10-CM | POA: Diagnosis not present

## 2021-03-04 DIAGNOSIS — E1142 Type 2 diabetes mellitus with diabetic polyneuropathy: Secondary | ICD-10-CM | POA: Diagnosis not present

## 2021-03-04 DIAGNOSIS — D649 Anemia, unspecified: Secondary | ICD-10-CM | POA: Diagnosis not present

## 2021-03-04 DIAGNOSIS — Z79899 Other long term (current) drug therapy: Secondary | ICD-10-CM | POA: Diagnosis not present

## 2021-03-04 DIAGNOSIS — Z7984 Long term (current) use of oral hypoglycemic drugs: Secondary | ICD-10-CM | POA: Diagnosis not present

## 2021-03-04 DIAGNOSIS — C9310 Chronic myelomonocytic leukemia not having achieved remission: Secondary | ICD-10-CM

## 2021-03-04 DIAGNOSIS — D693 Immune thrombocytopenic purpura: Secondary | ICD-10-CM | POA: Diagnosis not present

## 2021-03-04 DIAGNOSIS — R1012 Left upper quadrant pain: Secondary | ICD-10-CM | POA: Diagnosis not present

## 2021-03-04 DIAGNOSIS — E538 Deficiency of other specified B group vitamins: Secondary | ICD-10-CM | POA: Diagnosis not present

## 2021-03-04 DIAGNOSIS — I10 Essential (primary) hypertension: Secondary | ICD-10-CM | POA: Diagnosis not present

## 2021-03-04 DIAGNOSIS — M4724 Other spondylosis with radiculopathy, thoracic region: Secondary | ICD-10-CM | POA: Diagnosis not present

## 2021-03-04 DIAGNOSIS — Z452 Encounter for adjustment and management of vascular access device: Secondary | ICD-10-CM

## 2021-03-04 DIAGNOSIS — D696 Thrombocytopenia, unspecified: Secondary | ICD-10-CM

## 2021-03-04 DIAGNOSIS — R519 Headache, unspecified: Secondary | ICD-10-CM | POA: Diagnosis not present

## 2021-03-04 LAB — BPAM PLATELET PHERESIS
Blood Product Expiration Date: 202209222359
Blood Product Expiration Date: 202209222359
ISSUE DATE / TIME: 202209191551
Unit Type and Rh: 5100
Unit Type and Rh: 600

## 2021-03-04 LAB — CMP (CANCER CENTER ONLY)
ALT: 12 U/L (ref 0–44)
AST: 12 U/L — ABNORMAL LOW (ref 15–41)
Albumin: 3.5 g/dL (ref 3.5–5.0)
Alkaline Phosphatase: 126 U/L (ref 38–126)
Anion gap: 8 (ref 5–15)
BUN: 23 mg/dL (ref 8–23)
CO2: 24 mmol/L (ref 22–32)
Calcium: 9.4 mg/dL (ref 8.9–10.3)
Chloride: 104 mmol/L (ref 98–111)
Creatinine: 0.86 mg/dL (ref 0.44–1.00)
GFR, Estimated: >60 mL/min (ref >60)
Glucose, Bld: 219 mg/dL — ABNORMAL HIGH (ref 70–99)
Potassium: 4.1 mmol/L (ref 3.5–5.1)
Sodium: 136 mmol/L (ref 135–145)
Total Bilirubin: 0.8 mg/dL (ref 0.3–1.2)
Total Protein: 7.7 g/dL (ref 6.5–8.1)

## 2021-03-04 LAB — PREPARE PLATELET PHERESIS
Unit division: 0
Unit division: 0

## 2021-03-04 LAB — CBC WITH DIFFERENTIAL (CANCER CENTER ONLY)
Abs Immature Granulocytes: 0.91 10*3/uL — ABNORMAL HIGH (ref 0.00–0.07)
Basophils Absolute: 0 10*3/uL (ref 0.0–0.1)
Basophils Relative: 0 %
Eosinophils Absolute: 0 10*3/uL (ref 0.0–0.5)
Eosinophils Relative: 0 %
HCT: 26.5 % — ABNORMAL LOW (ref 36.0–46.0)
Hemoglobin: 8.4 g/dL — ABNORMAL LOW (ref 12.0–15.0)
Immature Granulocytes: 10 %
Lymphocytes Relative: 7 %
Lymphs Abs: 0.6 10*3/uL — ABNORMAL LOW (ref 0.7–4.0)
MCH: 27.5 pg (ref 26.0–34.0)
MCHC: 31.7 g/dL (ref 30.0–36.0)
MCV: 86.9 fL (ref 80.0–100.0)
Monocytes Absolute: 1.7 10*3/uL — ABNORMAL HIGH (ref 0.1–1.0)
Monocytes Relative: 20 %
Neutro Abs: 5.5 10*3/uL (ref 1.7–7.7)
Neutrophils Relative %: 63 %
Platelet Count: <5 10*3/uL — CL (ref 150–400)
RBC: 3.05 MIL/uL — ABNORMAL LOW (ref 3.87–5.11)
RDW: 17.5 % — ABNORMAL HIGH (ref 11.5–15.5)
WBC Count: 8.8 10*3/uL (ref 4.0–10.5)
nRBC: 0 % (ref 0.0–0.2)

## 2021-03-04 MED ORDER — HEPARIN SOD (PORK) LOCK FLUSH 100 UNIT/ML IV SOLN
250.0000 [IU] | Freq: Once | INTRAVENOUS | Status: AC | PRN
  Administered 2021-03-04: 250 [IU]

## 2021-03-04 MED ORDER — SODIUM CHLORIDE 0.9% FLUSH
10.0000 mL | Freq: Once | INTRAVENOUS | Status: AC
Start: 2021-03-04 — End: 2021-03-04
  Administered 2021-03-04: 10 mL via INTRAVENOUS

## 2021-03-04 MED ORDER — SODIUM CHLORIDE 0.9% FLUSH
3.0000 mL | INTRAVENOUS | Status: DC | PRN
  Administered 2021-03-04: 3 mL

## 2021-03-04 MED ORDER — SODIUM CHLORIDE 0.9 % IV SOLN: Freq: Once | INTRAVENOUS | Status: AC

## 2021-03-04 MED ORDER — SODIUM CHLORIDE 0.9 % IV SOLN
75.0000 mg/m2 | Freq: Once | INTRAVENOUS | Status: AC
  Administered 2021-03-04: 135 mg via INTRAVENOUS
  Filled 2021-03-04: qty 13.5

## 2021-03-04 MED ORDER — SODIUM CHLORIDE 0.9 % IV SOLN
10.0000 mg | Freq: Once | INTRAVENOUS | Status: AC
  Administered 2021-03-04: 10 mg via INTRAVENOUS
  Filled 2021-03-04: qty 10

## 2021-03-04 MED FILL — Dexamethasone Sodium Phosphate Inj 100 MG/10ML: INTRAMUSCULAR | Qty: 1 | Status: AC

## 2021-03-04 NOTE — Telephone Encounter (Signed)
Called patient to schedule Annual Wellness Visit.  Please schedule with Nurse Health Advisor Leroy Kennedy, RN at [facility]. Please call [phone] ask for [facility]  Patient phone no answer

## 2021-03-04 NOTE — Telephone Encounter (Signed)
CRITICAL VALUE STICKER  CRITICAL VALUE: PLATELET < 5  RECEIVER (on-site recipient of call):Shayle Donahoo P. LPN  DATE & TIME NOTIFIED: 03/04/21 8:09 am  MESSENGER (representative from lab): Ulice Dash   MD NOTIFIED: Dr. Burr Medico   TIME OF NOTIFICATION: 8:15 am

## 2021-03-05 ENCOUNTER — Other Ambulatory Visit: Payer: Self-pay

## 2021-03-05 ENCOUNTER — Inpatient Hospital Stay: Payer: Medicare HMO

## 2021-03-05 ENCOUNTER — Ambulatory Visit (HOSPITAL_COMMUNITY)
Admission: RE | Admit: 2021-03-05 | Discharge: 2021-03-05 | Disposition: A | Discharge status: Home / Self Care | Payer: Medicare HMO | Source: Ambulatory Visit | Attending: Hematology | Admitting: Hematology

## 2021-03-05 VITALS — BP 142/70 | HR 124 | Temp 98.0°F | Resp 16

## 2021-03-05 DIAGNOSIS — M546 Pain in thoracic spine: Secondary | ICD-10-CM | POA: Insufficient documentation

## 2021-03-05 DIAGNOSIS — Z8603 Personal history of neoplasm of uncertain behavior: Secondary | ICD-10-CM | POA: Diagnosis not present

## 2021-03-05 DIAGNOSIS — C9310 Chronic myelomonocytic leukemia not having achieved remission: Secondary | ICD-10-CM

## 2021-03-05 DIAGNOSIS — D72829 Elevated white blood cell count, unspecified: Secondary | ICD-10-CM | POA: Diagnosis not present

## 2021-03-05 DIAGNOSIS — R531 Weakness: Secondary | ICD-10-CM | POA: Diagnosis not present

## 2021-03-05 DIAGNOSIS — G8929 Other chronic pain: Secondary | ICD-10-CM | POA: Insufficient documentation

## 2021-03-05 DIAGNOSIS — M4804 Spinal stenosis, thoracic region: Secondary | ICD-10-CM | POA: Diagnosis not present

## 2021-03-05 DIAGNOSIS — D6959 Other secondary thrombocytopenia: Secondary | ICD-10-CM | POA: Diagnosis not present

## 2021-03-05 DIAGNOSIS — D696 Thrombocytopenia, unspecified: Secondary | ICD-10-CM | POA: Diagnosis not present

## 2021-03-05 LAB — PREPARE PLATELET PHERESIS: Unit division: 0

## 2021-03-05 LAB — BPAM PLATELET PHERESIS
Blood Product Expiration Date: 202209222359
ISSUE DATE / TIME: 202209220838
Unit Type and Rh: 600

## 2021-03-05 MED ORDER — SODIUM CHLORIDE 0.9 % IV SOLN
75.0000 mg/m2 | Freq: Once | INTRAVENOUS | Status: AC
  Administered 2021-03-05: 135 mg via INTRAVENOUS
  Filled 2021-03-05: qty 13.5

## 2021-03-05 MED ORDER — DEXAMETHASONE SODIUM PHOSPHATE 100 MG/10ML IJ SOLN
10.0000 mg | Freq: Once | INTRAMUSCULAR | Status: AC
  Administered 2021-03-05: 10 mg via INTRAVENOUS
  Filled 2021-03-05: qty 10

## 2021-03-05 MED ORDER — HEPARIN SOD (PORK) LOCK FLUSH 100 UNIT/ML IV SOLN
500.0000 [IU] | Freq: Once | INTRAVENOUS | Status: AC | PRN
  Administered 2021-03-05: 500 [IU]

## 2021-03-05 MED ORDER — GADOBUTROL 1 MMOL/ML IV SOLN
7.0000 mL | Freq: Once | INTRAVENOUS | Status: AC | PRN
  Administered 2021-03-05: 7 mL via INTRAVENOUS

## 2021-03-05 MED ORDER — PALONOSETRON HCL INJECTION 0.25 MG/5ML
0.2500 mg | Freq: Once | INTRAVENOUS | Status: AC
  Administered 2021-03-05: 0.25 mg via INTRAVENOUS
  Filled 2021-03-05: qty 5

## 2021-03-05 MED ORDER — SODIUM CHLORIDE 0.9% FLUSH
10.0000 mL | INTRAVENOUS | Status: DC | PRN
  Administered 2021-03-05: 10 mL

## 2021-03-05 MED ORDER — SODIUM CHLORIDE 0.9 % IV SOLN: Freq: Once | INTRAVENOUS | Status: AC

## 2021-03-05 NOTE — Patient Instructions (Signed)
[facility]  Discharge Instructions: Thank you for choosing [facility] to provide your oncology and hematology care.   If you have a lab appointment with the Cancer Center, please go directly to the Cancer Center and check in at the registration area.   Wear comfortable clothing and clothing appropriate for easy access to any Portacath or PICC line.   We strive to give you quality time with your provider. You may need to reschedule your appointment if you arrive late (15 or more minutes).  Arriving late affects you and other patients whose appointments are after yours.  Also, if you miss three or more appointments without notifying the office, you may be dismissed from the clinic at the provider's discretion.      For prescription refill requests, have your pharmacy contact our office and allow 72 hours for refills to be completed.    Today you received the following chemotherapy and/or immunotherapy agents: azacitidine.     To help prevent nausea and vomiting after your treatment, we encourage you to take your nausea medication as directed.  BELOW ARE SYMPTOMS THAT SHOULD BE REPORTED IMMEDIATELY: . *FEVER GREATER THAN 100.4 F (38 C) OR HIGHER . *CHILLS OR SWEATING . *NAUSEA AND VOMITING THAT IS NOT CONTROLLED WITH YOUR NAUSEA MEDICATION . *UNUSUAL SHORTNESS OF BREATH . *UNUSUAL BRUISING OR BLEEDING . *URINARY PROBLEMS (pain or burning when urinating, or frequent urination) . *BOWEL PROBLEMS (unusual diarrhea, constipation, pain near the anus) . TENDERNESS IN MOUTH AND THROAT WITH OR WITHOUT PRESENCE OF ULCERS (sore throat, sores in mouth, or a toothache) . UNUSUAL RASH, SWELLING OR PAIN  . UNUSUAL VAGINAL DISCHARGE OR ITCHING   Items with * indicate a potential emergency and should be followed up as soon as possible or go to the Emergency Department if any problems should occur.  Please show the CHEMOTHERAPY ALERT CARD or IMMUNOTHERAPY  ALERT CARD at check-in to the Emergency Department and triage nurse.  Should you have questions after your visit or need to cancel or reschedule your appointment, please contact [facility]  Dept: [phone]  and follow the prompts.  Office hours are 8:00 a.m. to 4:30 p.m. Monday - Friday. Please note that voicemails left after 4:00 p.m. may not be returned until the following business day.  We are closed weekends and major holidays. You have access to a nurse at all times for urgent questions. Please call the main number to the clinic Dept: [phone] and follow the prompts.   For any non-urgent questions, you may also contact your provider using MyChart. We now offer e-Visits for anyone 18 and older to request care online for non-urgent symptoms. For details visit [email or web address].   Also download the MyChart app! Go to the app store, search "MyChart", open the app, select [facility], and log in with your MyChart username and password.  Due to Covid, a mask is required upon entering the hospital/clinic. If you do not have a mask, one will be given to you upon arrival. For doctor visits, patients may have 1 support person aged 18 or older with them. For treatment visits, patients cannot have anyone with them due to current Covid guidelines and our immunocompromised population.   

## 2021-03-05 NOTE — Progress Notes (Signed)
Per Dr. Burr Medico, ok to treat with elevated HR. Patient is asymptomatic and states she did not take her beta blocker this morning.

## 2021-03-07 ENCOUNTER — Observation Stay (HOSPITAL_COMMUNITY): Payer: Medicare HMO

## 2021-03-07 ENCOUNTER — Other Ambulatory Visit: Payer: Self-pay

## 2021-03-07 ENCOUNTER — Inpatient Hospital Stay (HOSPITAL_COMMUNITY)
Admission: EM | Admit: 2021-03-07 | Discharge: 2021-03-12 | DRG: 813 | Disposition: A | Discharge status: Home Health | Payer: Medicare HMO | Attending: Internal Medicine | Admitting: Internal Medicine

## 2021-03-07 DIAGNOSIS — Z66 Do not resuscitate: Secondary | ICD-10-CM | POA: Diagnosis present

## 2021-03-07 DIAGNOSIS — S22000A Wedge compression fracture of unspecified thoracic vertebra, initial encounter for closed fracture: Secondary | ICD-10-CM

## 2021-03-07 DIAGNOSIS — Z886 Allergy status to analgesic agent status: Secondary | ICD-10-CM

## 2021-03-07 DIAGNOSIS — Z806 Family history of leukemia: Secondary | ICD-10-CM

## 2021-03-07 DIAGNOSIS — M8458XA Pathological fracture in neoplastic disease, other specified site, initial encounter for fracture: Secondary | ICD-10-CM | POA: Diagnosis present

## 2021-03-07 DIAGNOSIS — E1142 Type 2 diabetes mellitus with diabetic polyneuropathy: Secondary | ICD-10-CM | POA: Diagnosis present

## 2021-03-07 DIAGNOSIS — Z79899 Other long term (current) drug therapy: Secondary | ICD-10-CM

## 2021-03-07 DIAGNOSIS — T148XXA Other injury of unspecified body region, initial encounter: Secondary | ICD-10-CM | POA: Diagnosis present

## 2021-03-07 DIAGNOSIS — E871 Hypo-osmolality and hyponatremia: Secondary | ICD-10-CM | POA: Diagnosis present

## 2021-03-07 DIAGNOSIS — Z7984 Long term (current) use of oral hypoglycemic drugs: Secondary | ICD-10-CM

## 2021-03-07 DIAGNOSIS — G893 Neoplasm related pain (acute) (chronic): Secondary | ICD-10-CM | POA: Diagnosis present

## 2021-03-07 DIAGNOSIS — C9310 Chronic myelomonocytic leukemia not having achieved remission: Secondary | ICD-10-CM | POA: Diagnosis present

## 2021-03-07 DIAGNOSIS — E538 Deficiency of other specified B group vitamins: Secondary | ICD-10-CM | POA: Diagnosis present

## 2021-03-07 DIAGNOSIS — Z20822 Contact with and (suspected) exposure to covid-19: Secondary | ICD-10-CM | POA: Diagnosis present

## 2021-03-07 DIAGNOSIS — D509 Iron deficiency anemia, unspecified: Secondary | ICD-10-CM | POA: Diagnosis present

## 2021-03-07 DIAGNOSIS — I10 Essential (primary) hypertension: Secondary | ICD-10-CM | POA: Diagnosis present

## 2021-03-07 DIAGNOSIS — Z800 Family history of malignant neoplasm of digestive organs: Secondary | ICD-10-CM

## 2021-03-07 DIAGNOSIS — L988 Other specified disorders of the skin and subcutaneous tissue: Secondary | ICD-10-CM | POA: Diagnosis present

## 2021-03-07 DIAGNOSIS — Z8249 Family history of ischemic heart disease and other diseases of the circulatory system: Secondary | ICD-10-CM

## 2021-03-07 DIAGNOSIS — D696 Thrombocytopenia, unspecified: Secondary | ICD-10-CM | POA: Diagnosis present

## 2021-03-07 DIAGNOSIS — R000 Tachycardia, unspecified: Secondary | ICD-10-CM

## 2021-03-07 DIAGNOSIS — R29890 Loss of height: Secondary | ICD-10-CM | POA: Diagnosis present

## 2021-03-07 DIAGNOSIS — J9811 Atelectasis: Secondary | ICD-10-CM | POA: Diagnosis not present

## 2021-03-07 DIAGNOSIS — R410 Disorientation, unspecified: Secondary | ICD-10-CM | POA: Diagnosis not present

## 2021-03-07 DIAGNOSIS — X58XXXA Exposure to other specified factors, initial encounter: Secondary | ICD-10-CM | POA: Diagnosis present

## 2021-03-07 DIAGNOSIS — D72829 Elevated white blood cell count, unspecified: Secondary | ICD-10-CM

## 2021-03-07 DIAGNOSIS — E559 Vitamin D deficiency, unspecified: Secondary | ICD-10-CM | POA: Diagnosis present

## 2021-03-07 DIAGNOSIS — E785 Hyperlipidemia, unspecified: Secondary | ICD-10-CM | POA: Diagnosis present

## 2021-03-07 DIAGNOSIS — T40605A Adverse effect of unspecified narcotics, initial encounter: Secondary | ICD-10-CM | POA: Diagnosis not present

## 2021-03-07 DIAGNOSIS — M549 Dorsalgia, unspecified: Secondary | ICD-10-CM | POA: Diagnosis present

## 2021-03-07 DIAGNOSIS — Z833 Family history of diabetes mellitus: Secondary | ICD-10-CM

## 2021-03-07 DIAGNOSIS — D6959 Other secondary thrombocytopenia: Principal | ICD-10-CM | POA: Diagnosis present

## 2021-03-07 DIAGNOSIS — E1165 Type 2 diabetes mellitus with hyperglycemia: Secondary | ICD-10-CM | POA: Diagnosis present

## 2021-03-07 DIAGNOSIS — M62838 Other muscle spasm: Secondary | ICD-10-CM | POA: Diagnosis present

## 2021-03-07 LAB — COMPREHENSIVE METABOLIC PANEL
ALT: 17 U/L (ref 0–44)
AST: 14 U/L — ABNORMAL LOW (ref 15–41)
Albumin: 3.1 g/dL — ABNORMAL LOW (ref 3.5–5.0)
Alkaline Phosphatase: 98 U/L (ref 38–126)
Anion gap: 9 (ref 5–15)
BUN: 15 mg/dL (ref 8–23)
CO2: 21 mmol/L — ABNORMAL LOW (ref 22–32)
Calcium: 8.8 mg/dL — ABNORMAL LOW (ref 8.9–10.3)
Chloride: 104 mmol/L (ref 98–111)
Creatinine, Ser: 0.79 mg/dL (ref 0.44–1.00)
GFR, Estimated: >60 mL/min (ref >60)
Glucose, Bld: 344 mg/dL — ABNORMAL HIGH (ref 70–99)
Potassium: 3.6 mmol/L (ref 3.5–5.1)
Sodium: 134 mmol/L — ABNORMAL LOW (ref 135–145)
Total Bilirubin: 1.1 mg/dL (ref 0.3–1.2)
Total Protein: 7 g/dL (ref 6.5–8.1)

## 2021-03-07 LAB — CBC WITH DIFFERENTIAL/PLATELET
Abs Immature Granulocytes: 2.45 10*3/uL — ABNORMAL HIGH (ref 0.00–0.07)
Basophils Absolute: 0.1 10*3/uL (ref 0.0–0.1)
Basophils Relative: 1 %
Eosinophils Absolute: 0 10*3/uL (ref 0.0–0.5)
Eosinophils Relative: 0 %
HCT: 29.5 % — ABNORMAL LOW (ref 36.0–46.0)
Hemoglobin: 9.3 g/dL — ABNORMAL LOW (ref 12.0–15.0)
Immature Granulocytes: 16 %
Lymphocytes Relative: 7 %
Lymphs Abs: 1 10*3/uL (ref 0.7–4.0)
MCH: 27.7 pg (ref 26.0–34.0)
MCHC: 31.5 g/dL (ref 30.0–36.0)
MCV: 87.8 fL (ref 80.0–100.0)
Monocytes Absolute: 2.3 10*3/uL — ABNORMAL HIGH (ref 0.1–1.0)
Monocytes Relative: 15 %
Neutro Abs: 9.4 10*3/uL — ABNORMAL HIGH (ref 1.7–7.7)
Neutrophils Relative %: 61 %
Platelets: 6 10*3/uL — CL (ref 150–400)
RBC: 3.36 MIL/uL — ABNORMAL LOW (ref 3.87–5.11)
RDW: 18 % — ABNORMAL HIGH (ref 11.5–15.5)
WBC: 15.3 10*3/uL — ABNORMAL HIGH (ref 4.0–10.5)
nRBC: 0 % (ref 0.0–0.2)

## 2021-03-07 LAB — CBG MONITORING, ED: Glucose-Capillary: 255 mg/dL — ABNORMAL HIGH (ref 70–99)

## 2021-03-07 MED ORDER — HYDROMORPHONE HCL 1 MG/ML IJ SOLN
1.0000 mg | Freq: Once | INTRAMUSCULAR | Status: AC
  Administered 2021-03-07: 1 mg via INTRAVENOUS
  Filled 2021-03-07: qty 1

## 2021-03-07 MED ORDER — SODIUM CHLORIDE 0.9 % IV SOLN
10.0000 mL/h | Freq: Once | INTRAVENOUS | Status: AC
  Administered 2021-03-07: 10 mL/h via INTRAVENOUS

## 2021-03-07 MED ORDER — HYDROMORPHONE HCL 1 MG/ML IJ SOLN
0.5000 mg | Freq: Once | INTRAMUSCULAR | Status: AC
  Administered 2021-03-07: 0.5 mg via INTRAVENOUS
  Filled 2021-03-07: qty 1

## 2021-03-07 MED ORDER — HYDROMORPHONE HCL 1 MG/ML IJ SOLN
0.5000 mg | INTRAMUSCULAR | Status: DC | PRN
Start: 2021-03-07 — End: 2021-03-10
  Administered 2021-03-08 – 2021-03-10 (×8): 0.5 mg via INTRAVENOUS
  Filled 2021-03-07: qty 0.5
  Filled 2021-03-07 (×2): qty 1
  Filled 2021-03-07: qty 0.5
  Filled 2021-03-07 (×4): qty 1

## 2021-03-07 MED ORDER — INSULIN ASPART 100 UNIT/ML IJ SOLN
0.0000 [IU] | Freq: Every day | INTRAMUSCULAR | Status: DC
  Administered 2021-03-07 – 2021-03-11 (×4): 3 [IU] via SUBCUTANEOUS

## 2021-03-07 MED ORDER — SENNOSIDES-DOCUSATE SODIUM 8.6-50 MG PO TABS
2.0000 | ORAL_TABLET | Freq: Every day | ORAL | Status: DC
  Administered 2021-03-07 – 2021-03-11 (×5): 2 via ORAL
  Filled 2021-03-07 (×5): qty 2

## 2021-03-07 MED ORDER — LACTATED RINGERS IV SOLN: INTRAVENOUS | Status: AC

## 2021-03-07 MED ORDER — METOPROLOL SUCCINATE ER 25 MG PO TB24
12.5000 mg | ORAL_TABLET | ORAL | Status: AC
  Administered 2021-03-07: 12.5 mg via ORAL
  Filled 2021-03-07: qty 1

## 2021-03-07 MED ORDER — INSULIN ASPART 100 UNIT/ML IJ SOLN
0.0000 [IU] | Freq: Three times a day (TID) | INTRAMUSCULAR | Status: DC
  Administered 2021-03-08 (×2): 3 [IU] via SUBCUTANEOUS
  Administered 2021-03-08: 5 [IU] via SUBCUTANEOUS
  Administered 2021-03-09 (×2): 3 [IU] via SUBCUTANEOUS
  Administered 2021-03-10: 5 [IU] via SUBCUTANEOUS

## 2021-03-07 NOTE — ED Provider Notes (Signed)
[facility] EMERGENCY DEPARTMENT Provider Note   CSN: [id number] Arrival date & time: 03/07/21  1625     History Chief Complaint  Patient presents with   Back Pain    Kathleen Gibson is a 70 y.o. female.   Back Pain Patient presents with back pain.  Mid back pain.  Has had pain for a while now.  Has a history of [name].  On chemotherapy for this.  Had MRI done on Friday with today being Sunday that showed acute to subacute pathologic compression fractures of T7 and T8.  Had some edema or hematoma posterior to this.  No localizing weakness to the legs but states she does feel weak all over.  Not really on pain medicines at home.  Has required frequent platelet transfusions.  Was transfused on Thursday and is planned for transfusion on Monday.  Also deals with anemia    Past Medical History:  Diagnosis Date   Diabetes ([place])    HLD (hyperlipidemia)    Hypertension    Pernicious anemia    Pneumonia 09/05/2019   Renal insufficiency 09/06/2019   Vitamin D deficiency     Patient Active Problem List   Diagnosis Date Noted   Red blood cell antibody positive 12/18/2020   Thrombocytopenia ([place]) 12/17/2020   [name] (chronic myelomonocytic leukemia) ([place]) 12/17/2020   Anemia 10/28/2020   Mixed hyperlipidemia due to type 2 diabetes mellitus ([place]) 10/28/2020   Abdominal lymphadenopathy 01/09/2020   Periaortic lymphadenopathy 01/09/2020   Pelvic lymphadenopathy 01/09/2020   History of noncompliance with medical treatment 09/09/2019   Statin declined 08/09/2018   Polyarthralgia 07/31/2018   Tachycardia 10/25/2017   Iron deficiency 04/10/2017   B12 deficiency 04/06/2017   Hereditary and idiopathic peripheral neuropathy 05/25/2016   INSOMNIA, CHRONIC 10/06/2009   Diabetic polyneuropathy associated with type 2 diabetes mellitus ([place]) 11/16/2007   HLD (hyperlipidemia) 10/05/2007   ANEMIA, PERNICIOUS 03/02/2007   Type 2 diabetes mellitus with diabetic polyneuropathy, without  long-term current use of insulin ([place]) 01/23/2007   Essential hypertension 12/27/2006    Past Surgical History:  Procedure Laterality Date   ABDOMINAL HERNIA REPAIR  2009   ABDOMINOPLASTY     AXILLARY LYMPH NODE BIOPSY Left 11/11/2020   Procedure: LEFT AXILLARY EXCISIONAL LYMPH NODE BIOPSY;  Surgeon: [name], MD;  Location: [place];  Service: General;  Laterality: Left;   [place]   hemorroid surgery  2007   TONSILLECTOMY AND ADENOIDECTOMY  1957     OB History     Gravida  3   Para  3   Term      Preterm      AB      Living  3      SAB      IAB      Ectopic      Multiple      Live Births              Family History  Problem Relation Age of Onset   Cancer Mother 12       unknown type cancer    Hypertension Mother    Hypertension Father    Heart failure Father    Pneumonia Father    Diabetes Sister    Leukemia Brother    Colon cancer Brother 32   Heart Problems Brother    Leukemia Other    Diabetes Sister    Heart Problems Sister    Cancer  Brother    Heart Problems Brother     Social History   Tobacco Use   Smoking status: Never   Smokeless tobacco: Never  Vaping Use   Vaping Use: Never used  Substance Use Topics   Alcohol use: No   Drug use: No    Home Medications Prior to Admission medications   Medication Sig Start Date End Date Taking? Authorizing Provider  acetaminophen-codeine (TYLENOL #3) 300-30 MG tablet Take 1 tablet by mouth every 8 (eight) hours as needed for moderate pain. 02/22/21   Truitt Merle, MD  cyclobenzaprine (FLEXERIL) 5 MG tablet Take 1 tablet (5 mg total) by mouth every 8 (eight) hours as needed for muscle spasms. 01/19/21   Truitt Merle, MD  ferrous sulfate 325 (65 FE) MG tablet Take 2 tablets (650 mg total) by mouth daily with breakfast. 12/22/20   Kuneff, Renee A, DO  gabapentin (NEURONTIN) 300 MG capsule Take 2 capsules (600 mg total) by mouth 2 (two) times daily. 12/22/20   Kuneff,  Renee A, DO  glucose monitoring kit (FREESTYLE) monitoring kit 1 each by Does not apply route as needed for other. Use as directed 02/24/14   Lucille Passy, MD  LORazepam (ATIVAN) 1 MG tablet Take 1 tablet (1 mg total) by mouth once as needed for up to 1 dose for anxiety. Tale 1 tab 1 hour before MRI, and OK to take second tab right before MRI if needed 02/22/21   Truitt Merle, MD  metoprolol succinate (TOPROL-XL) 50 MG 24 hr tablet Take 1 tablet (50 mg total) by mouth daily. 12/22/20   Kuneff, Renee A, DO  sitaGLIPtin-metformin (JANUMET) 50-1000 MG tablet Take 1 tablet by mouth at bedtime. 12/22/20   Kuneff, Renee A, DO  Vitamin D, Cholecalciferol, 25 MCG (1000 UT) CAPS Take 1,000 Units by mouth daily.     [provider]  zolpidem (AMBIEN) 5 MG tablet Take 1 tablet (5 mg total) by mouth at bedtime. 12/22/20   Kuneff, Renee A, DO    Allergies    Asa [aspirin] and Nsaids  Review of Systems   Review of Systems  Musculoskeletal:  Positive for back pain.   Physical Exam Updated Vital Signs BP 127/76   Pulse 92   Temp 98.7 F (37.1 C) (Oral)   Resp 20   Ht _0  (1.6 m)   Wt 69.9 kg   SpO2 97%   BMI 27.28 kg/m   Physical Exam Vitals and nursing note reviewed.  HENT:     Head: Normocephalic.  Eyes:     Pupils: Pupils are equal, round, and reactive to light.  Cardiovascular:     Rate and Rhythm: Regular rhythm. Tachycardia present.  Pulmonary:     Breath sounds: No wheezing or rhonchi.  Abdominal:     Tenderness: There is no abdominal tenderness.  Musculoskeletal:     Cervical back: Neck supple.     Comments: Tenderness of her mid to lower thoracic spine.  No deformity.  No bruising.  Skin:    General: Skin is warm.     Capillary Refill: Capillary refill takes less than 2 seconds.     Comments: There are few scattered areas of superficial bruising  Neurological:     Mental Status: She is alert and oriented to person, place, and time.     Comments: Sensation intact  bilateral lower extremities.  Good straight leg raise bilaterally.  Good flexion extension at ankles.    ED Results / Procedures / Treatments  Labs (all labs ordered are listed, but only abnormal results are displayed) Labs Reviewed  COMPREHENSIVE METABOLIC PANEL - Abnormal; Notable for the following components:      Result Value   Sodium 134 (*)    CO2 21 (*)    Glucose, Bld 344 (*)    Calcium 8.8 (*)    Albumin 3.1 (*)    AST 14 (*)    All other components within normal limits  CBC WITH DIFFERENTIAL/PLATELET - Abnormal; Notable for the following components:   WBC 15.3 (*)    RBC 3.36 (*)    Hemoglobin 9.3 (*)    HCT 29.5 (*)    RDW 18.0 (*)    Platelets 6 (*)    Neutro Abs 9.4 (*)    Monocytes Absolute 2.3 (*)    Abs Immature Granulocytes 2.45 (*)    All other components within normal limits  RESP PANEL BY RT-PCR (FLU A&B, COVID) ARPGX2  SAMPLE TO BLOOD BANK    EKG None  Radiology No results found.  Procedures Procedures   Medications Ordered in ED Medications  HYDROmorphone (DILAUDID) injection 0.5 mg (0.5 mg Intravenous Given 03/07/21 1733)  HYDROmorphone (DILAUDID) injection 1 mg (1 mg Intravenous Given 03/07/21 1852)    ED Course  I have reviewed the triage vital signs and the nursing notes.  Pertinent labs & imaging results that were available during my care of the patient were reviewed by me and considered in my medical decision making (see chart for details).    MDM Rules/Calculators/A&P                           Patient presents with back pain.  Acute on chronic.  Has [name] and is currently on chemotherapy.  Had MRI done on Friday which showed compression fractures at T7 and T8.  I think the pain is likely from this although there was edema versus hematoma in the area.  No numbness or weakness but do have to worry about hematoma with her severe thrombocytopenia.  Platelets are 6.  Was transfused on Friday.  Requiring IV Dilaudid for pain control.  I  think patient benefit from mission to the hospital for evaluation of the fractures and better pain control.  Also will likely require transfusion.  Gets transfusions twice a week while she is on therapy and plan for transfusion tomorrow anyway.  Discussed with Dr. Lorenso Courier.  Feels the patient can stay at the hospital here.  Patient is comanaged by both [facility] and Dr. Burr Medico.  Does not appear to need transfer to [facility] at this time.  Will admit to hospitalist Final Clinical Impression(s) / ED Diagnoses Final diagnoses:  Thoracic compression fracture, closed, initial encounter ([place])  Cancer associated pain  Chronic myelomonocytic leukemia not having achieved remission ([place])  Thrombocytopenia ([place])    Rx / DC Orders ED Discharge Orders     None        Davonna Belling, MD 03/07/21 1918

## 2021-03-07 NOTE — ED Notes (Signed)
Spoke to blood bank regarding platelets. Staff states they have to check with the doctor before releasing them and will call me back when they are ready.

## 2021-03-07 NOTE — ED Triage Notes (Signed)
Pt here d/t back pain. Pt  currently undergoing chemo. Pt had MRI Friday which showed compression fracture.Platelet count also low.  Pt states pain is 7/10.

## 2021-03-07 NOTE — H&P (Addendum)
History and Physical  Kathleen Gibson YFV:[id number] DOB: 02-Jul-1950 DOA: 03/07/2021  Referring physician: Dr. Alvino Chapel, [place]. PCP: [name], DO  Outpatient Specialists: Medical oncology. Patient coming from: Home.  Chief Complaint: Worsening back pain.  HPI: Kathleen Gibson is a 70 y.o. female with medical history significant for [name], severe thrombocytopenia with frequent transfusions Mondays and Thursdays, last chemotherapy was last week, chronic constipation, chronic normocytic anemia, chronic back pain since August 2022, type 2 diabetes with polyneuropathy, essential hypertension, who presented to [facility] ED with complaints of worsening back pain despite home Flexeril and Tylenol 3.  No trauma, no falls.  States after her last chemotherapy last week she became increasingly more fatigued and constipated.  Endorses that she had bowel movements all night.  She presents to the ED today due to her back pain.  MRI of the thoracic spine with and without contrast on 03/05/2021 showed acute or subacute pathological compression fractures of T7 and T8.  Greater loss of height at the latter 30% and up to moderate bilateral T8 neuroforaminal stenosis, although primarily degenerative in nature with no significant retropulsion of bone.  No thoracic spinal stenosis.  Diffusely abnormal marrow signal in the visible spine and ribs.  This could reflect a diffuse malignant marrow replacement, versus a generalized red marrow reactivation in the setting of anemia/thrombocytopenia.  There is no extraosseous extension of any abnormal soft tissue from the affected vertebrae, although that possibility was raised at T7 but felt instead to be paraspinal edema or hematoma related to acute or subacute pathological compression fracture of T7 and T8.  She received 1.5 mg of IV Dilaudid in the ED with improvement of her symptomatology.  Platelet count 6 K on presentation.  1 unit of platelets ordered to be transfused by EDP.  TRH,  hospitalist team, was asked to admit.  ED Course: Temperature 98.7.  BP 128/83, pulse 126, respiration rate 16, O2 saturation 99% on room air.  Lab studies significant for serum sodium 124, serum bicarb 21, glucose 344, creatinine 0.79 with GFR greater than 60.  Albumin 3.1.  WBC 15.3, hemoglobin 9.3, MCV 87.  Platelet count 6.  Review of Systems: Review of systems as noted in the HPI. All other systems reviewed and are negative.   Past Medical History:  Diagnosis Date   Diabetes ([place])    HLD (hyperlipidemia)    Hypertension    Pernicious anemia    Pneumonia 09/05/2019   Renal insufficiency 09/06/2019   Vitamin D deficiency    Past Surgical History:  Procedure Laterality Date   ABDOMINAL HERNIA REPAIR  2009   ABDOMINOPLASTY     AXILLARY LYMPH NODE BIOPSY Left 11/11/2020   Procedure: LEFT AXILLARY EXCISIONAL LYMPH NODE BIOPSY;  Surgeon: [name], MD;  Location: [place] [facility];  Service: General;  Laterality: Left;   [place]   hemorroid surgery  2007   [place]    Social History:  reports that she has never smoked. She has never used smokeless tobacco. She reports that she does not drink alcohol and does not use drugs.   Allergies  Allergen Reactions   Asa [Aspirin] Other (See Comments)    Contraindicated d/t low plts   Nsaids Other (See Comments)    Contraindicated d/t low plts    Family History  Problem Relation Age of Onset   Cancer Mother 53       unknown type cancer  Hypertension Mother    Hypertension Father    Heart failure Father    Pneumonia Father    Diabetes Sister    Leukemia Brother    Colon cancer Brother 33   Heart Problems Brother    Leukemia Other    Diabetes Sister    Heart Problems Sister    Cancer Brother    Heart Problems Brother       Prior to Admission medications   Medication Sig Start Date End Date Taking? Authorizing Provider  acetaminophen-codeine (TYLENOL #3) 300-30 MG  tablet Take 1 tablet by mouth every 8 (eight) hours as needed for moderate pain. 02/22/21   Truitt Merle, MD  cyclobenzaprine (FLEXERIL) 5 MG tablet Take 1 tablet (5 mg total) by mouth every 8 (eight) hours as needed for muscle spasms. 01/19/21   Truitt Merle, MD  ferrous sulfate 325 (65 FE) MG tablet Take 2 tablets (650 mg total) by mouth daily with breakfast. 12/22/20   Kuneff, Renee A, DO  gabapentin (NEURONTIN) 300 MG capsule Take 2 capsules (600 mg total) by mouth 2 (two) times daily. 12/22/20   Kuneff, Renee A, DO  glucose monitoring kit (FREESTYLE) monitoring kit 1 each by Does not apply route as needed for other. Use as directed 02/24/14   Lucille Passy, MD  LORazepam (ATIVAN) 1 MG tablet Take 1 tablet (1 mg total) by mouth once as needed for up to 1 dose for anxiety. Tale 1 tab 1 hour before MRI, and OK to take second tab right before MRI if needed 02/22/21   Truitt Merle, MD  metoprolol succinate (TOPROL-XL) 50 MG 24 hr tablet Take 1 tablet (50 mg total) by mouth daily. 12/22/20   Kuneff, Renee A, DO  sitaGLIPtin-metformin (JANUMET) 50-1000 MG tablet Take 1 tablet by mouth at bedtime. 12/22/20   Kuneff, Renee A, DO  Vitamin D, Cholecalciferol, 25 MCG (1000 UT) CAPS Take 1,000 Units by mouth daily.     [provider]  zolpidem (AMBIEN) 5 MG tablet Take 1 tablet (5 mg total) by mouth at bedtime. 12/22/20   Kuneff, Reinaldo Raddle, DO    Physical Exam: BP 136/69   Pulse (!) 117   Temp 98.7 F (37.1 C) (Oral)   Resp (!) 21   Ht $R'5\' 3"'gM$  (1.6 m)   Wt 69.9 kg   SpO2 98%   BMI 27.28 kg/m   General: 70 y.o. year-old female well developed well nourished in no acute distress.  Alert and oriented x3. Cardiovascular: Regular rate and rhythm with no rubs or gallops.  No thyromegaly or JVD noted.  No lower extremity edema. 2/4 pulses in all 4 extremities. Respiratory: Faint mild rales at bases.  No wheeze noted.  Good inspiratory effort. Abdomen: Soft nontender nondistended with normal bowel sounds x4  quadrants. Muskuloskeletal: No cyanosis, clubbing or edema noted bilaterally Neuro: CN II-XII intact, strength, sensation, reflexes Skin: No ulcerative lesions noted or rashes Psychiatry: Judgement and insight appear normal. Mood is appropriate for condition and setting          Labs on Admission:  Basic Metabolic Panel: Recent Labs  Lab 03/01/21 1301 03/04/21 0737 03/07/21 1740  NA 140 136 134*  K 4.1 4.1 3.6  CL 108 104 104  CO2 24 24 21*  GLUCOSE 182* 219* 344*  BUN $Re'16 23 15  'kpQ$ CREATININE 0.85 0.86 0.79  CALCIUM 8.9 9.4 8.8*   Liver Function Tests: Recent Labs  Lab 03/01/21 1301 03/04/21 0737 03/07/21 1740  AST 18 12*  14*  ALT $Re'16 12 17  'One$ ALKPHOS 140* 126 98  BILITOT 0.7 0.8 1.1  PROT 7.8 7.7 7.0  ALBUMIN 3.5 3.5 3.1*   No results for input(s): LIPASE, AMYLASE in the last 168 hours. No results for input(s): AMMONIA in the last 168 hours. CBC: Recent Labs  Lab 03/01/21 1301 03/04/21 0737 03/07/21 1740  WBC 6.7 8.8 15.3*  NEUTROABS 4.3 5.5 9.4*  HGB 8.8* 8.4* 9.3*  HCT 27.9* 26.5* 29.5*  MCV 88.3 86.9 87.8  PLT 10* <5* 6*   Cardiac Enzymes: No results for input(s): CKTOTAL, CKMB, CKMBINDEX, TROPONINI in the last 168 hours.  BNP (last 3 results) No results for input(s): BNP in the last 8760 hours.  ProBNP (last 3 results) No results for input(s): PROBNP in the last 8760 hours.  CBG: No results for input(s): GLUCAP in the last 168 hours.  Radiological Exams on Admission: No results found.  EKG: I independently viewed the EKG done and my findings are as followed: None available at the time of this visit.  Assessment/Plan Present on Admission:  Intractable back pain  Active Problems:   Intractable back pain  Intractable back pain secondary to T7-T8 pathological fracture Received IV Dilaudid as needed in the ED for severe pain, continue Bowel regimen to avoid opiate induced constipation Continue pain control  Severe thrombocytopenia Presented  with platelet count of 6000 1 unit platelet ordered to be transfused by EDP Repeat CBC posttransfusion, per protocol. Transfuse platelet count less than 10,000 or if active bleeding present.    Leukocytosis, suspect reactive Rule out active infective process Obtain UA and chest x-ray  Sinus tachycardia Obtain twelve-lead EKG Resume home Toprol-XL Monitor on telemetry  SIRS. No clear evidence of active infective process Afebrile and non toxic appearing Presented with WBC 15.3 K, HR 125 Follow UA and CXR  Type 2 diabetes with hyperglycemia Presented with serum glucose greater than 300. Obtain A1c Start insulin sliding scale. Gentle IV fluid hydration LR at 30 cc/hr x 1 day  Pseudo hyponatremia Serum sodium 134, serum glucose 344 Corrected sodium for hyperglycemia 138.  Diabetic polyneuropathy Resume home gabapentin.  Essential hypertension Resume home Toprol-XL.  Iron deficiency anemia Resume home ferrous sulfate.  Moderate protein calorie malnutrition Albumin 3.1 BMI 27 Encourage oral intake.   DVT prophylaxis: SCDs.  Code Status: DNR, as stated by the patient herself.  Family Communication: None at bedside.  Disposition Plan: Admitted to telemetry medical.  Consults called: Medical oncology consulted by EDP.  Admission status: Observation status.   Status is: Observation    Dispo:  Patient From:  Home  Planned Disposition:  Home  Medically stable for discharge:  No       Kayleen Memos MD [facility] Pager [phone]  If 7PM-7AM, please contact night-coverage [email or web address] Password [facility]  03/07/2021, 9:05 PM

## 2021-03-08 ENCOUNTER — Inpatient Hospital Stay: Payer: Medicare HMO | Admitting: Hematology

## 2021-03-08 ENCOUNTER — Inpatient Hospital Stay: Payer: Medicare HMO

## 2021-03-08 DIAGNOSIS — I10 Essential (primary) hypertension: Secondary | ICD-10-CM | POA: Diagnosis present

## 2021-03-08 DIAGNOSIS — D72829 Elevated white blood cell count, unspecified: Secondary | ICD-10-CM | POA: Diagnosis not present

## 2021-03-08 DIAGNOSIS — D72823 Leukemoid reaction: Secondary | ICD-10-CM | POA: Diagnosis not present

## 2021-03-08 DIAGNOSIS — C9310 Chronic myelomonocytic leukemia not having achieved remission: Secondary | ICD-10-CM

## 2021-03-08 DIAGNOSIS — Z7984 Long term (current) use of oral hypoglycemic drugs: Secondary | ICD-10-CM | POA: Diagnosis not present

## 2021-03-08 DIAGNOSIS — M4854XD Collapsed vertebra, not elsewhere classified, thoracic region, subsequent encounter for fracture with routine healing: Secondary | ICD-10-CM | POA: Diagnosis not present

## 2021-03-08 DIAGNOSIS — X58XXXA Exposure to other specified factors, initial encounter: Secondary | ICD-10-CM | POA: Diagnosis present

## 2021-03-08 DIAGNOSIS — E1165 Type 2 diabetes mellitus with hyperglycemia: Secondary | ICD-10-CM | POA: Diagnosis present

## 2021-03-08 DIAGNOSIS — M8448XD Pathological fracture, other site, subsequent encounter for fracture with routine healing: Secondary | ICD-10-CM

## 2021-03-08 DIAGNOSIS — D509 Iron deficiency anemia, unspecified: Secondary | ICD-10-CM | POA: Diagnosis present

## 2021-03-08 DIAGNOSIS — Z800 Family history of malignant neoplasm of digestive organs: Secondary | ICD-10-CM | POA: Diagnosis not present

## 2021-03-08 DIAGNOSIS — L988 Other specified disorders of the skin and subcutaneous tissue: Secondary | ICD-10-CM | POA: Diagnosis present

## 2021-03-08 DIAGNOSIS — Z886 Allergy status to analgesic agent status: Secondary | ICD-10-CM | POA: Diagnosis not present

## 2021-03-08 DIAGNOSIS — E785 Hyperlipidemia, unspecified: Secondary | ICD-10-CM | POA: Diagnosis present

## 2021-03-08 DIAGNOSIS — Z20822 Contact with and (suspected) exposure to covid-19: Secondary | ICD-10-CM | POA: Diagnosis present

## 2021-03-08 DIAGNOSIS — Z79899 Other long term (current) drug therapy: Secondary | ICD-10-CM | POA: Diagnosis not present

## 2021-03-08 DIAGNOSIS — M8458XA Pathological fracture in neoplastic disease, other specified site, initial encounter for fracture: Secondary | ICD-10-CM | POA: Diagnosis present

## 2021-03-08 DIAGNOSIS — Z806 Family history of leukemia: Secondary | ICD-10-CM | POA: Diagnosis not present

## 2021-03-08 DIAGNOSIS — E871 Hypo-osmolality and hyponatremia: Secondary | ICD-10-CM | POA: Diagnosis present

## 2021-03-08 DIAGNOSIS — D696 Thrombocytopenia, unspecified: Secondary | ICD-10-CM | POA: Diagnosis not present

## 2021-03-08 DIAGNOSIS — R410 Disorientation, unspecified: Secondary | ICD-10-CM | POA: Diagnosis not present

## 2021-03-08 DIAGNOSIS — Z833 Family history of diabetes mellitus: Secondary | ICD-10-CM | POA: Diagnosis not present

## 2021-03-08 DIAGNOSIS — T148XXA Other injury of unspecified body region, initial encounter: Secondary | ICD-10-CM | POA: Diagnosis present

## 2021-03-08 DIAGNOSIS — Z66 Do not resuscitate: Secondary | ICD-10-CM | POA: Diagnosis present

## 2021-03-08 DIAGNOSIS — T40605A Adverse effect of unspecified narcotics, initial encounter: Secondary | ICD-10-CM | POA: Diagnosis not present

## 2021-03-08 DIAGNOSIS — G893 Neoplasm related pain (acute) (chronic): Secondary | ICD-10-CM | POA: Diagnosis not present

## 2021-03-08 DIAGNOSIS — D6959 Other secondary thrombocytopenia: Secondary | ICD-10-CM | POA: Diagnosis present

## 2021-03-08 DIAGNOSIS — E118 Type 2 diabetes mellitus with unspecified complications: Secondary | ICD-10-CM

## 2021-03-08 DIAGNOSIS — M549 Dorsalgia, unspecified: Secondary | ICD-10-CM | POA: Diagnosis not present

## 2021-03-08 DIAGNOSIS — E1142 Type 2 diabetes mellitus with diabetic polyneuropathy: Secondary | ICD-10-CM | POA: Diagnosis not present

## 2021-03-08 DIAGNOSIS — E559 Vitamin D deficiency, unspecified: Secondary | ICD-10-CM | POA: Diagnosis present

## 2021-03-08 DIAGNOSIS — Z8249 Family history of ischemic heart disease and other diseases of the circulatory system: Secondary | ICD-10-CM | POA: Diagnosis not present

## 2021-03-08 LAB — MAGNESIUM: Magnesium: 1.7 mg/dL (ref 1.7–2.4)

## 2021-03-08 LAB — PREPARE PLATELET PHERESIS: Unit division: 0

## 2021-03-08 LAB — CBC
HCT: 26.3 % — ABNORMAL LOW (ref 36.0–46.0)
Hemoglobin: 8.1 g/dL — ABNORMAL LOW (ref 12.0–15.0)
MCH: 27.6 pg (ref 26.0–34.0)
MCHC: 30.8 g/dL (ref 30.0–36.0)
MCV: 89.8 fL (ref 80.0–100.0)
Platelets: 6 10*3/uL — CL (ref 150–400)
RBC: 2.93 MIL/uL — ABNORMAL LOW (ref 3.87–5.11)
RDW: 17.9 % — ABNORMAL HIGH (ref 11.5–15.5)
WBC: 15.7 10*3/uL — ABNORMAL HIGH (ref 4.0–10.5)
nRBC: 0 % (ref 0.0–0.2)

## 2021-03-08 LAB — HEMOGLOBIN A1C
Hgb A1c MFr Bld: 9.4 % — ABNORMAL HIGH (ref 4.8–5.6)
Mean Plasma Glucose: 223.08 mg/dL

## 2021-03-08 LAB — BPAM PLATELET PHERESIS
Blood Product Expiration Date: 202209252359
ISSUE DATE / TIME: 202209252125
Unit Type and Rh: 5100

## 2021-03-08 LAB — CBC WITH DIFFERENTIAL/PLATELET
Abs Immature Granulocytes: 2.7 10*3/uL — ABNORMAL HIGH (ref 0.00–0.07)
Band Neutrophils: 4 %
Basophils Absolute: 0 10*3/uL (ref 0.0–0.1)
Basophils Relative: 0 %
Eosinophils Absolute: 0 10*3/uL (ref 0.0–0.5)
Eosinophils Relative: 0 %
HCT: 30.4 % — ABNORMAL LOW (ref 36.0–46.0)
Hemoglobin: 9.7 g/dL — ABNORMAL LOW (ref 12.0–15.0)
Lymphocytes Relative: 6 %
Lymphs Abs: 1.2 10*3/uL (ref 0.7–4.0)
MCH: 28 pg (ref 26.0–34.0)
MCHC: 31.9 g/dL (ref 30.0–36.0)
MCV: 87.9 fL (ref 80.0–100.0)
Metamyelocytes Relative: 7 %
Monocytes Absolute: 2.5 10*3/uL — ABNORMAL HIGH (ref 0.1–1.0)
Monocytes Relative: 12 %
Myelocytes: 3 %
Neutro Abs: 14.2 10*3/uL — ABNORMAL HIGH (ref 1.7–7.7)
Neutrophils Relative %: 65 %
Platelets: 5 10*3/uL — CL (ref 150–400)
Promyelocytes Relative: 3 %
RBC: 3.46 MIL/uL — ABNORMAL LOW (ref 3.87–5.11)
RDW: 18.2 % — ABNORMAL HIGH (ref 11.5–15.5)
WBC: 20.6 10*3/uL — ABNORMAL HIGH (ref 4.0–10.5)
nRBC: 0 % (ref 0.0–0.2)

## 2021-03-08 LAB — BASIC METABOLIC PANEL
Anion gap: 7 (ref 5–15)
BUN: 14 mg/dL (ref 8–23)
CO2: 24 mmol/L (ref 22–32)
Calcium: 8.9 mg/dL (ref 8.9–10.3)
Chloride: 104 mmol/L (ref 98–111)
Creatinine, Ser: 0.64 mg/dL (ref 0.44–1.00)
GFR, Estimated: >60 mL/min (ref >60)
Glucose, Bld: 268 mg/dL — ABNORMAL HIGH (ref 70–99)
Potassium: 3.7 mmol/L (ref 3.5–5.1)
Sodium: 135 mmol/L (ref 135–145)

## 2021-03-08 LAB — SARS CORONAVIRUS 2 (TAT 6-24 HRS): SARS Coronavirus 2: NEGATIVE

## 2021-03-08 LAB — URINALYSIS, ROUTINE W REFLEX MICROSCOPIC
Bilirubin Urine: NEGATIVE
Glucose, UA: 150 mg/dL — AB
Hgb urine dipstick: NEGATIVE
Ketones, ur: NEGATIVE mg/dL
Leukocytes,Ua: NEGATIVE
Nitrite: NEGATIVE
Protein, ur: 30 mg/dL — AB
Specific Gravity, Urine: 1.024 (ref 1.005–1.030)
pH: 6 (ref 5.0–8.0)

## 2021-03-08 LAB — CBG MONITORING, ED
Glucose-Capillary: 232 mg/dL — ABNORMAL HIGH (ref 70–99)
Glucose-Capillary: 227 mg/dL — ABNORMAL HIGH (ref 70–99)
Glucose-Capillary: 273 mg/dL — ABNORMAL HIGH (ref 70–99)
Glucose-Capillary: 181 mg/dL — ABNORMAL HIGH (ref 70–99)

## 2021-03-08 LAB — PHOSPHORUS: Phosphorus: 3.6 mg/dL (ref 2.5–4.6)

## 2021-03-08 MED ORDER — METOPROLOL SUCCINATE ER 25 MG PO TB24
25.0000 mg | ORAL_TABLET | Freq: Every day | ORAL | Status: DC
  Administered 2021-03-08 – 2021-03-12 (×5): 25 mg via ORAL
  Filled 2021-03-08 (×5): qty 1

## 2021-03-08 MED ORDER — SODIUM CHLORIDE 0.9% IV SOLUTION: Freq: Once | INTRAVENOUS | Status: DC

## 2021-03-08 MED ORDER — ROMIPLOSTIM INJECTION 500 MCG
10.0000 ug/kg | Freq: Once | SUBCUTANEOUS | Status: AC
  Administered 2021-03-08: 700 ug via SUBCUTANEOUS
  Filled 2021-03-08: qty 1.4

## 2021-03-08 MED ORDER — GABAPENTIN 300 MG PO CAPS
600.0000 mg | ORAL_CAPSULE | Freq: Two times a day (BID) | ORAL | Status: DC
  Administered 2021-03-08 – 2021-03-12 (×9): 600 mg via ORAL
  Filled 2021-03-08 (×10): qty 2

## 2021-03-08 NOTE — ED Notes (Signed)
Critical platelets 5, Dr. Nevada Crane made aware.

## 2021-03-08 NOTE — Progress Notes (Signed)
Inpatient Diabetes Program Recommendations  AACE/ADA: New Consensus Statement on Inpatient Glycemic Control (2015)  Target Ranges:  Prepandial:   less than 140 mg/dL      Peak postprandial:   less than 180 mg/dL (1-2 hours)      Critically ill patients:  140 - 180 mg/dL   Lab Results  Component Value Date   GLUCAP 227 (H) 03/08/2021   HGBA1C 9.4 (H) 03/08/2021    Review of Glycemic Control Results for KEAH, LAMBA (MRN [id number]) as of 03/08/2021 13:51  Ref. Range 03/07/2021 22:09 03/08/2021 07:54 03/08/2021 12:02  Glucose-Capillary Latest Ref Range: 70 - 99 mg/dL 255 (H) 232 (H) 227 (H)   Diabetes history: Type 2 DM Outpatient Diabetes medications: Janumet 50-1000 QHS Current orders for Inpatient glycemic control: Novolog 0-9 units TID, Novolog 0-5 units QHS  Inpatient Diabetes Program Recommendations:    Consider adding Levemir 10 units QHS.   Thanks, Bronson Curb, MSN, RNC-OB Diabetes Coordinator [phone] (8a-5p)

## 2021-03-08 NOTE — Progress Notes (Addendum)
HEMATOLOGY-ONCOLOGY PROGRESS NOTE  SUBJECTIVE: [name] is followed by our office for [name] with severe thrombocytopenia.  She is currently receiving chemotherapy with azacitidine days 1 through 5 every 28 days.  She completed her third cycle on 03/05/2021.  She also receives Nplate 10 mcg/kg weekly.  She was due for outpatient follow-up in our office today injection for platelet transfusion with HLA matched platelets.  She presented to the emergency department with worsening back pain.  She had an MRI of the thoracic spine performed on 03/05/2021 which showed an acute or subacute pathologic compression fracture of T7 and T8.  She received IV Dilaudid in the emergency department with improvement of her back pain.  Lab work on admission showed a WBC of 15.3, hemoglobin 9.3, platelet count 6000.  It appears that she received 1 unit of platelets and then repeat platelet count was checked which was less than 5000.  Another 3 units of platelets was ordered.  I saw the patient in the emergency department.  Her daughter is at the bedside.  She has a lot of bruising all over her body.  No active bleeding at this time.  She does have a skin lesion on her right temple that does bleed intermittently but is currently scabbed over.  Her back pain is currently controlled.  The patient does exhibit some confusion and does not know where she is currently.  Had to be reoriented.  Daughter states that she is somewhat irritable.  Oncology History  [name] (chronic myelomonocytic leukemia) ([place])  10/29/2020 Imaging   CT CAP  IMPRESSION: 1. Splenomegaly with pathologically enlarged lymph nodes above and below the diaphragm, with overall stable to minimally increased abdominal adenopathy and interval progression of the pelvic adenopathy. 2. Small volume abdominopelvic ascites with diffuse mesenteric stranding. 3. Scattered bilateral pulmonary micro nodules measuring 1-2 mm. 4. Distended gallbladder with some layering hyperdense  material representing layering sludge and tiny stones seen on prior ultrasound. 5. Aortic atherosclerosis.   10/30/2020 Pathology Results   DIAGNOSIS:   BONE MARROW, ASPIRATE, CLOT, CORE:  -  Hypercellular bone marrow with panhyperplasia, atypia, and no  increase in blasts  -  See comment   PERIPHERAL BLOOD:  -  Marked thrombocytopenia  -  Absolute monocytosis  -  Normocytic anemia  -  See CBC data and comment   COMMENT:  The bone marrow is hypercellular for age (approximately 80%) with myeloid hyperplasia with maturational left shift, erythroid hyperplasia, and increased megakaryocytes.  Mild multilineage atypia is present. Blasts are not increased on aspirate smears (1% by manual differential count) or by CD34 immunohistochemistry on the core biopsy.  Concurrent flow cytometric analysis of the bone marrow aspirate demonstrates increased monocytes, and no increase in blasts or abnormal lymphoid population (see [id number]).  Monocytes are also increased in peripheral  blood, persistent per electronic medical record.  In aggregate, the  findings raise the possibility of a myeloid neoplasm with the  differential diagnosis including a low-grade myelodysplastic syndrome and chronic myelomonocytic leukemia (dysplastic type).    ADDENDUM:  A reticulin special stain performed on the bone marrow core biopsy reveals no significant increase in reticulin fibrosis.  ADDENDUM:  CYTOGENETIC RESULTS:  Karyotype: 46,XX[20]  Interpretation: NORMAL FEMALE KARYOTYPE   FISH RESULTS:  Results: NORMAL   ADDENDUM:  CD123 immunohistochemistry performed on the core biopsy highlights scattered aggregates of positively staining cells consistent with plasmacytoid dendritic cells.    10/30/2020 Pathology Results   DIAGNOSIS:   BONE MARROW; FLOW CYTOMETRIC ANALYSIS:  -  Increased monocytes  -  Scant B-cells present  -  No immunophenotypically aberrant T-cell population identified  -  No increase in  blasts  -  See comment   COMMENT:  Monocytes are relatively increased (12% of all cells), without aberrant expression of CD56.  B-cells comprise <1% of total lymphocytes. CD34-positive blasts are not increased (<1% of all cells).  Correlation with concurrent morphology is recommended for complete diagnostic interpretation and overall blast enumeration (see [id number]).    10/30/2020 Pathology Results   FINAL MICROSCOPIC DIAGNOSIS:   A. LYMPH NODE, RIGHT AXILLARY, NEEDLE CORE BIOPSY:  -Lymphoid tissue present  -See comment   COMMENT:  The sections show several small needle core biopsy fragments of lymphoid tissue displaying degenerative cellular changes/necrosis and hence cannot be accurately evaluated.  Sample for flow cytometric analysis not available.  Immunohistochemical stain for CD3 and CD20 were performed with appropriate controls.  There is a mixture of T and B cells in their apparently respective compartments.  There is no definite metastatic malignancy.    11/11/2020 Pathology Results   DIAGNOSIS:   LEFT AXILLARY LYMPH NODE EXCISIONAL BIOPSY; FLOW CYTOMETRIC ANALYSIS:  -  No monotypic B-cell or immunophenotypically aberrant T-cell  population identified  -  See comment   COMMENT:  Flow cytometric analysis identifies B-cells with a normal kappa:lambda ratio of 1.7:1.  A subset of the polytypic B-cells expresses CD10. T-cells show a CD4:CD8 ratio of 3.3:1 without immunophenotypic aberrancy with the markers evaluated.  Although these results do not support the diagnosis of a clonal lymphoid population, sampling issues must always be considered when negative results are obtained, as focal lesions may not be represented in the specimen submitted.  In addition, flow cytometric immunophenotyping will not exclude other pathology if present (e.g. Hodgkin lymphoma, some T-cell lymphomas, metastatic and infectious diseases).   11/11/2020 Pathology Results   FINAL MICROSCOPIC DIAGNOSIS:   A.  LYMPH NODE, LEFT AXILLARY, DISSECTION:  -  Follicular hyperplasia with interfollicular expansion  -  See comment   COMMENT:  Sections of the lymph nodes reveal generally preserved lymph node architecture.  There is follicular hyperplasia with some follicles showing increased hyalinization of germinal centers and mild concentric encircling of germinal centers with small lymphocytes (onion skinning). Interfollicular areas are expanded with increased vascular proliferation, small lymphocytes, plasma cells, histiocytic cells, and patchy neutrophils.  The lymph node capsule is variably thickened by fibrosis with few plasma cells.   A panel of immunohistochemical stains is performed for further  characterization.  CD3 and CD20/PAX5 highlight T-cell and B-cell  compartments, respectively.  Germinal centers are highlighted by CD10 and BCL6 with appropriate absent expression of BCL2.  CD5 is similar to CD3.  CD43 also stains the T-cells.  CD4-positive T-cells exceed CD8-positive T-cells.  CD30 highlights scattered immunoblasts.  CD21 reveals intact follicular dendritic cell meshworks.  CD68 highlights increased histiocytic cells.  HHV 8 is negative.  T. pallidum reveals no definitive organisms.  Pancytokeratin is negative.  TdT stains rare cells.  CD138 highlights plasma cells which are not increased in number and show polytypic light chain expression by kappa/lambda in situ  hybridization.  EBV is negative by in situ hybridization.   Concurrent flow cytometric analysis is negative for a monoclonal B-cell or immunophenotypically aberrant T-cell population (see [id number]).   Together, the findings above are non-specific and can be seen in  reactive and infectious processes as well as Castleman disease.  There is no definitive morphologic or flow cytometric evidence of involvement by a  lymphoproliferative disorder from the current workup.   12/17/2020 Initial Diagnosis   [name] (chronic myelomonocytic leukemia)  ([place])   12/28/2020 -  Chemotherapy    Patient is on Treatment Plan: MYELODYSPLASIA  AZACITIDINE IV D1-5 Q28D          REVIEW OF SYSTEMS:   Constitutional: Denies fevers, chills  Eyes: Denies blurriness of vision Ears, nose, mouth, throat, and face: Denies mucositis or sore throat Respiratory: Denies cough, dyspnea or wheezes Cardiovascular: Denies palpitation, chest discomfort Gastrointestinal:  Denies nausea, heartburn or change in bowel habits Skin: She has a lesion at her right temple that bleeds intermittently, has multiple ecchymoses over her body Lymphatics: Denies new lymphadenopathy  Neurological: She has some confusion, did not know where she was Extremities: No lower extremity edema All other systems were reviewed with the patient and are negative.  I have reviewed the past medical history, past surgical history, social history and family history with the patient and they are unchanged from previous note.   PHYSICAL EXAMINATION: ECOG PERFORMANCE STATUS: 2 - Symptomatic, <50% confined to bed  Vitals:   03/08/21 0615 03/08/21 0659  BP: 136/88 131/78  Pulse: (!) 118 (!) 121  Resp: 18 15  Temp: 97.8 F (36.6 C) 97.6 F (36.4 C)  SpO2:     Filed Weights   03/07/21 1635 03/07/21 1647  Weight: 69.9 kg 69.9 kg    Intake/Output from previous day: 09/25 0701 - 09/26 0700 In: 2334 [Blood:2334] Out: -   GENERAL:alert, no distress and comfortable SKIN: Multiple ecchymoses over her body EYES: normal, Conjunctiva are pink and non-injected, sclera clear NEURO: Alert, oriented to person, but does not know where she is.  Reorients easily.  LABORATORY DATA:  I have reviewed the data as listed CMP Latest Ref Rng & Units 03/08/2021 03/07/2021 03/04/2021  Glucose 70 - 99 mg/dL 268(H) 344(H) 219(H)  BUN 8 - 23 mg/dL 14 15 23   Creatinine 0.44 - 1.00 mg/dL 0.64 0.79 0.86  Sodium 135 - 145 mmol/L 135 134(L) 136  Potassium 3.5 - 5.1 mmol/L 3.7 3.6 4.1  Chloride 98 - 111  mmol/L 104 104 104  CO2 22 - 32 mmol/L 24 21(L) 24  Calcium 8.9 - 10.3 mg/dL 8.9 8.8(L) 9.4  Total Protein 6.5 - 8.1 g/dL - 7.0 7.7  Total Bilirubin 0.3 - 1.2 mg/dL - 1.1 0.8  Alkaline Phos 38 - 126 U/L - 98 126  AST 15 - 41 U/L - 14(L) 12(L)  ALT 0 - 44 U/L - 17 12    Lab Results  Component Value Date   WBC 20.6 (H) 03/08/2021   HGB 9.7 (L) 03/08/2021   HCT 30.4 (L) 03/08/2021   MCV 87.9 03/08/2021   PLT 5 (LL) 03/08/2021   NEUTROABS 14.2 (H) 03/08/2021    MR THORACIC SPINE W WO CONTRAST  Result Date: 03/06/2021 CLINICAL DATA:  70 year old female with myeloproliferative disorder, hematologic malignancy. Severe thrombocytopenia. Persistent back pain and leg weakness. EXAM: MRI THORACIC WITHOUT AND WITH CONTRAST TECHNIQUE: Multiplanar and multiecho pulse sequences of the thoracic spine were obtained without and with intravenous contrast. CONTRAST:  28mL GADAVIST GADOBUTROL 1 MMOL/ML IV SOLN COMPARISON:  Chest CT 10/29/2020 FINDINGS: Limited cervical spine imaging: Preserved cervical lordosis. Disc and endplate degeneration appears age-appropriate. Diffusely decreased marrow signal in the cervical spine, see below. No destructive cervical spine lesion is evident. Thoracic spine segmentation:  Normal on the comparison. Alignment:  Stable thoracic kyphosis.  No spondylolisthesis. Vertebrae: Compression fracture of the T7 vertebral body  with about 15% loss of vertebral body height. Compression fracture of the T8 vertebral body with greater up to 30% loss of vertebral body height. No retropulsion of bone at either level. Patchy marrow edema and enhancement at both levels, slightly more diffuse at T7. And background abnormally decreased T1 marrow signal throughout the visible spine and ribs. But no other levels of marrow edema or abnormal marrow enhancement. Cord: Normal. Capacious thoracic spinal canal at most levels. No abnormal intradural enhancement. No dural thickening. Paraspinal and other soft  tissues: There is confluent paraspinal soft tissue edema at T7 and T8, maximal at the former on the left (series 21, image 24) where the pre contrast T1 signal raises the possibility of extraosseous extension of tumor there. But that is not corroborated on T2 imaging. And either confluent soft tissue edema or small paraspinal hematoma is suspected there. Negative visible thoracic and upper abdominal viscera. Disc levels: Most thoracic disc levels are normal for age. However, there is occasional mild degenerative thoracic facet and ligament flavum hypertrophy - and this includes T7-T8 and T8-T9. At T8-T9 there is a small left lateral recess disc protrusion (series 19, image 31) superimposed on the fracture. And combined with facet hypertrophy there is moderate bilateral T8 neural foraminal stenosis. But no spinal stenosis. At T11-T12 there is a small left paracentral disc protrusion. No associated stenosis. IMPRESSION: 1. Acute or subacute pathologic compression fractures of T7 and T8. Greater loss of height at the latter (30%) and up to moderate bilateral T8 neural foraminal stenosis, although primarily degenerative in nature with no significant retropulsion of bone. No thoracic spinal stenosis. 2. Diffusely abnormal marrow signal in the visible spine and ribs. This could reflect a diffuse malignant marrow replacement, versus a generalized red marrow reactivation in the setting of anemia/thrombocytopenia 3. There is no extraosseous extension of any abnormal soft tissue from the affected vertebrae, although that possibility was raised at T7 but felt instead to be paraspinal edema or hematoma related to #1. Electronically Signed   By: Genevie Ann M.D.   On: 03/06/2021 12:04   DG CHEST PORT 1 VIEW  Result Date: 03/07/2021 CLINICAL DATA:  Back pain EXAM: PORTABLE CHEST 1 VIEW COMPARISON:  01/04/2021 FINDINGS: Right PICC line is in place with the tip in the SVC. Low lung volumes with bibasilar atelectasis. Heart is  normal size. No effusions or acute bony abnormality. IMPRESSION: Right PICC line tip in the SVC. Low lung volumes, bibasilar atelectasis. Electronically Signed   By: Rolm Baptise M.D.   On: 03/07/2021 22:06    ASSESSMENT AND PLAN: 1.  [name] with severe thrombocytopenia 2.  Anemia 3.  Back pain secondary to compression fracture 4.  Mild confusion-?  Etiology 5.  Type 2 diabetes mellitus 6.  Diabetic polyneuropathy 7.  Hypertension  -Labs reviewed.  She has known persistent thrombocytopenia due to her [name] and recent chemotherapy.  She was scheduled to be seen in our office today to receive Nplate and a unit of HLA matched platelets.  We will proceed with Nplate 10 mcg/kg today.  She has already received 4 units of platelets so far this admission.  She is not actively bleeding.  Will hold on any additional platelet transfusion today.  Would not transfuse more than 2 units of platelets per day.  I have called the [facility] blood bank and have arranged for the HLA matched unit of platelets to be transferred from [place] [facility] to [facility] for potential transfusion on 03/09/2021.  This  unit expires on 03/09/2021 at midnight. -The patient hemoglobin is overall stable.  Recommend close monitoring.  Transfuse for hemoglobin less than 7.5. -Would recommend orthopedic consultation for evaluation of back pain.  The patient may benefit from a back brace.  I note that IR has been consulted for possible vertebroplasty.  Unsure if they will perform this procedure in the setting of thrombocytopenia.  Okay from our perspective to perform this procedure if they are willing to do so. -The patient has some mild confusion today requiring her to be reoriented on several occasions.  Unsure if this is due to pain medication.  Would consider obtaining a CT of the head if this persists to rule out possible bleed. -Management of chronic medical conditions per hospitalist   Future Appointments  Date Time Provider [place]  03/08/2021  9:30 AM [facility] [place] None  03/08/2021 10:00 AM Truitt Merle, MD [facility] None  03/08/2021 11:00 AM [facility] INFUSION [facility] None  03/11/2021  1:15 PM [facility] [place] FLUSH [facility] None  03/11/2021  2:15 PM [facility] INFUSION [facility] None  03/15/2021  2:30 PM [facility] [place] FLUSH [facility] None  03/15/2021  3:30 PM [facility] INFUSION [facility] None  03/19/2021  9:15 AM [facility] NURSE [facility]      LOS: 0 days   Mikey Bussing, DNP, AGPCNP-BC, AOCNP 03/08/21  Addendum  I have seen the patient, examined her. I agree with the assessment and and plan and have edited the notes.   Pt came in for worsening back pian and her thoracic spine MRI a few days ago showed T7/8 compression fracture. Plt has been very low lately mainly secondary to [name] and chemo. Pt has significant ecchymoses but no other active bleeding. I agree with plt transfusion 1-2 units a day, I do not think she needs more than that unless she has active bleeding.  Appreciate IR's input, KP was discussed and pt's plt is too low to have the procedure safely. I am not sure if we can get her plt above 30-50K even with plt transfusion.   Per pt and her daughter's request, I contacted her oncologist Dr. Linus Orn to see if he wants pt to be transferred to [facility] for further management and he said yes. I will contact our hospitalist team and get transfer initiated.   Truitt Merle  03/08/2021

## 2021-03-08 NOTE — Progress Notes (Addendum)
HOSPITAL MEDICINE OVERNIGHT EVENT NOTE    Discussed case with Dr. Burr Medico with Heme/Onc.  Dr. Annamaria Boots discussed case with Dr. Mariea Clonts on leukemia service at [place].  Dr. Mariea Clonts has graciously excepted the patient with a condition that patient and family are agreeable to transfer.  Per my discussion with Dr. Annamaria Boots, she has asked that we attempt to speak to the patient and reach out to the daughter to obtain consent because her efforts to reach the daughter have been unsuccessful.  I went to go evaluate the patient at the bedside who states that she does not want to approve transfer to [facility] health until her daughter also voices her approval.  I have attempted to call the daughter 3 times using the listed phone number and cannot get an answer.  Will attempt to call again later in the shift but if unsuccessful day team should follow-up and continue efforts to get consent for transfer.  Once consent is obtained [facility] health transfer center can be contacted to proceed with transfer.  Vernelle Emerald  MD [facility]

## 2021-03-08 NOTE — Progress Notes (Signed)
PROGRESS NOTE  Kathleen Gibson DPO:[id number] DOB: [birth year] DOA: 03/07/2021 PCP: [name], DO  Brief History   Kathleen Gibson is a 70 y.o. female with medical history significant for [name], severe thrombocytopenia with frequent transfusions Mondays and Thursdays, last chemotherapy was last week, chronic constipation, chronic normocytic anemia, chronic back pain since August 2022, type 2 diabetes with polyneuropathy, essential hypertension, who presented to [facility] ED with complaints of worsening back pain despite home Flexeril and Tylenol 3.  No trauma, no falls.  States after her last chemotherapy last week she became increasingly more fatigued and constipated.  Endorses that she had bowel movements all night.  She presents to the ED today due to her back pain.  MRI of the thoracic spine with and without contrast on 03/05/2021 showed acute or subacute pathological compression fractures of T7 and T8.  Greater loss of height at the latter 30% and up to moderate bilateral T8 neuroforaminal stenosis, although primarily degenerative in nature with no significant retropulsion of bone.  No thoracic spinal stenosis.  Diffusely abnormal marrow signal in the visible spine and ribs.  This could reflect a diffuse malignant marrow replacement, versus a generalized red marrow reactivation in the setting of anemia/thrombocytopenia.  There is no extraosseous extension of any abnormal soft tissue from the affected vertebrae, although that possibility was raised at T7 but felt instead to be paraspinal edema or hematoma related to acute or subacute pathological compression fracture of T7 and T8.  She received 1.5 mg of IV Dilaudid in the ED with improvement of her symptomatology.  Platelet count 6 K on presentation.  1 unit of platelets ordered to be transfused by EDP.  TRH, hospitalist team, was asked to admit.   ED Course: Temperature 98.7.  BP 128/83, pulse 126, respiration rate 16, O2 saturation 99% on room air.  Lab studies  significant for serum sodium 124, serum bicarb 21, glucose 344, creatinine 0.79 with GFR greater than 60.  Albumin 3.1.  WBC 15.3, hemoglobin 9.3, MCV 87.  Platelet count 6.   The patient received 4 pheresis packs of platelets over 9/24-9/25. Platelets this am were 5. Hematology/Oncology were consutled. They have ordered a dose of NPlate for the patient to receive today. They recommend no further platelet transfusions today. The patient has been evaluated by IR for possible vertebroplasty. They state that she would be a good candidate for the procedure, but that her platelets would have to be greater than 50.  Consultants  Interventional radiology Hematology Oncology  Procedures  None  Antibiotics   Anti-infectives (From admission, onward)    None      Subjective  The patient is resting on the gurney. No new complaints.  Objective   Vitals:  Vitals:   03/08/21 0857 03/08/21 1145  BP: (!) 141/84 129/87  Pulse: (!) 123 (!) 105  Resp: 20 19  Temp: 97.7 F (36.5 C)   SpO2: 97% 99%    Exam:  Constitutional:  The patient is awake and alert. She is oriented to self and place, but not to date. Respiratory:  CTA bilaterally, no w/r/r.  Respiratory effort normal. No retractions or accessory muscle use Cardiovascular:  RRR, no m/r/g No LE extremity edema   Normal pedal pulses Abdomen:  Abdomen appears normal; no tenderness or masses No hernias No HSM Musculoskeletal:  Digits/nails BUE: no clubbing, cyanosis, petechiae, infection exam of joints, bones, muscles of at least one of following: head/neck, RUE, LUE, RLE, LLE   Skin:  No  rashes, lesions, ulcers Positive for multiple ecchymoses. palpation of skin: no induration or nodules Neurologic:  CN 2-12 intact Sensation all 4 extremities intact Psychiatric:  Mental status: Confused Mood, affect appropriate Orientation to person, place, but not time.   I have personally reviewed the following:   Today's Data   Vitals Lab Data  CBC, BMP  Imaging  Thoracic MRI X-ray of the chest  Cardiology Data  EKG  Scheduled Meds:  sodium chloride   Intravenous Once   gabapentin  600 mg Oral BID   insulin aspart  0-5 Units Subcutaneous QHS   insulin aspart  0-9 Units Subcutaneous TID WC   metoprolol succinate  25 mg Oral Daily   senna-docusate  2 tablet Oral QHS   Continuous Infusions:  lactated ringers 30 mL/hr at 03/07/21 2245    Active Problems:   Intractable back pain   LOS: 0 days   A & P  Intractable back pain secondary to T7-T8 pathological fracture Received IV Dilaudid as needed in the ED for severe pain, continue Bowel regimen to avoid opiate induced constipation Continue pain control. IR has evaluated the patient and feels that she would be appropriate for verteberoplasty, but not until her platelets are greater than 50K. Will consult orthopedic surgery at the recommendation of hematology/oncology.   Severe thrombocytopenia Presented with platelet count of 6000. S/P transfusion of 4 units since 03/06/2021. Platelets are 5000 this am. Heme/Onc will give the patient one dose of NPlate today. They recommend no further transfusion today. I appreciate their help.   Leukocytosis, suspect reactive Rule out active infective process.  Obtain UA and chest x-ray. Likely due to [name].   Sinus tachycardia Obtain twelve-lead EKG. Resolved. Resume home Toprol-XL Monitor on telemetry   SIRS. No clear evidence of active infective process Afebrile and non toxic appearing Presented with WBC 15.3 K, HR 125 UA negative  CXR negative   Type 2 diabetes with hyperglycemia Presented with serum glucose greater than 300. Obtain A1c Start insulin sliding scale. Gentle IV fluid hydration LR at 30 cc/hr x 1 day   Pseudo hyponatremia Serum sodium 134, serum glucose 344 Corrected sodium for hyperglycemia 138. Resolved.   Diabetic polyneuropathy Resume home gabapentin.   Essential  hypertension Resume home Toprol-XL.   Iron deficiency anemia Resume home ferrous sulfate.   Moderate protein calorie malnutrition Albumin 3.1 BMI 27 Encourage oral intake.  I have seen and have examined this patient myself. I have spent 35 minutes in her evaluation and care.   DVT prophylaxis: SCDs. Code Status: DNR, as stated by the patient herself. Family Communication: None at bedside. Disposition Plan: Admitted to telemetry medical. Consults called: Medical oncology consulted by EDP. Admission status: Observation status.   Status is: Observation   Dispo:            Patient From: Home            Planned Disposition: Home          Medically stable for discharge: No         Cabrini Ruggieri, DO [facility] Direct contact: see [email or web address]  7PM-7AM contact night coverage as above 03/08/2021, 12:53 PM  LOS: 0 days

## 2021-03-08 NOTE — ED Notes (Signed)
Ambulatory to restroom to provide urine specimen.

## 2021-03-08 NOTE — Progress Notes (Signed)
Chief Complaint: Patient was seen in consultation today for T7 and T8 compression fractures  Referring Physician(s): Dr. Irene Pap  Supervising Physician: Katherina Right Lyla Glassing  Patient Status: [facility] - In-pt  History of Present Illness: Kathleen Gibson is a 70 y.o. female with multiple medical issues. Has been admitted with back pain that she states has been going on about 3 weeks. Can't recall falling or a specific event when the pain started, just woke up one morning with mid back pain. Denies pain or weakness into LE. States for the most part, pain comes and goes, mostly with activity, but sometimes at rest also. Currently she is comfortable with minimal discomfort but also just received some pain medication. She was very active prior to the onset of her pain, living very independently. She has been diagnosed with MDS, [name] and receiving treatment. Unfortunately she has been dealing with thrombocytopenia that does not appear to be responding to things to Nplate or Prednisone. PLT count has been staying around 10k or less for the past week.  Her admission PLT is 5k. She has been given 4 units PLTs so far. Repeat labs ordered. Oncology note reviewed.  Past Medical History:  Diagnosis Date  . Diabetes ([place])   . HLD (hyperlipidemia)   . Hypertension   . Pernicious anemia   . Pneumonia 09/05/2019  . Renal insufficiency 09/06/2019  . Vitamin D deficiency     Past Surgical History:  Procedure Laterality Date  . ABDOMINAL HERNIA REPAIR  2009  . ABDOMINOPLASTY    . AXILLARY LYMPH NODE BIOPSY Left 11/11/2020   Procedure: LEFT AXILLARY EXCISIONAL LYMPH NODE BIOPSY;  Surgeon: [name], MD;  Location: [place];  Service: General;  Laterality: Left;  . [place]  . GASTRIC BYPASS  2000  . hemorroid surgery  2007  . TONSILLECTOMY AND ADENOIDECTOMY  1957    Allergies: Asa [aspirin] and Nsaids  Medications: Prior to Admission medications   Medication Sig Start Date  End Date Taking? Authorizing Provider  acetaminophen (TYLENOL) 650 MG CR tablet Take 1,300 mg by mouth every 8 (eight) hours as needed for pain.   Yes [provider]  ammonium lactate (LAC-HYDRIN) 12 % lotion Apply 1 application topically in the morning. 02/19/21  Yes [provider]  gabapentin (NEURONTIN) 300 MG capsule Take 2 capsules (600 mg total) by mouth 2 (two) times daily. 12/22/20  Yes Kuneff, Renee A, DO  sitaGLIPtin-metformin (JANUMET) 50-1000 MG tablet Take 1 tablet by mouth at bedtime. 12/22/20  Yes Kuneff, Renee A, DO  acetaminophen-codeine (TYLENOL #3) 300-30 MG tablet Take 1 tablet by mouth every 8 (eight) hours as needed for moderate pain. Patient not taking: Reported on 03/07/2021 02/22/21   Truitt Merle, MD  cyclobenzaprine (FLEXERIL) 5 MG tablet Take 1 tablet (5 mg total) by mouth every 8 (eight) hours as needed for muscle spasms. 01/19/21   Truitt Merle, MD  ferrous sulfate 325 (65 FE) MG tablet Take 2 tablets (650 mg total) by mouth daily with breakfast. Patient not taking: Reported on 03/07/2021 12/22/20   Howard Pouch A, DO  glucose monitoring kit (FREESTYLE) monitoring kit 1 each by Does not apply route as needed for other. Use as directed 02/24/14   Lucille Passy, MD  LORazepam (ATIVAN) 1 MG tablet Take 1 tablet (1 mg total) by mouth once as needed for up to 1 dose for anxiety. Tale 1 tab 1 hour before MRI, and OK to take second tab right before  MRI if needed 02/22/21   Truitt Merle, MD  metoprolol succinate (TOPROL-XL) 50 MG 24 hr tablet Take 1 tablet (50 mg total) by mouth daily. 12/22/20   Kuneff, Renee A, DO  Vitamin D, Cholecalciferol, 25 MCG (1000 UT) CAPS Take 1,000 Units by mouth daily.     [provider]  zolpidem (AMBIEN) 5 MG tablet Take 1 tablet (5 mg total) by mouth at bedtime. 12/22/20   Howard Pouch A, DO     Family History  Problem Relation Age of Onset  . Cancer Mother 81       unknown type cancer   . Hypertension Mother   . Hypertension  Father   . Heart failure Father   . Pneumonia Father   . Diabetes Sister   . Leukemia Brother   . Colon cancer Brother 54  . Heart Problems Brother   . Leukemia Other   . Diabetes Sister   . Heart Problems Sister   . Cancer Brother   . Heart Problems Brother     Social History   Socioeconomic History  . Marital status: Divorced    Spouse name: Not on file  . Number of children: 3  . Years of education: 41  . Highest education level: Not on file  Occupational History  . Occupation: Retired  Tobacco Use  . Smoking status: Never  . Smokeless tobacco: Never  Vaping Use  . Vaping Use: Never used  Substance and Sexual Activity  . Alcohol use: No  . Drug use: No  . Sexual activity: Not on file  Other Topics Concern  . Not on file  Social History Narrative   Divorced. Retired Pension scheme manager.   12th grade education.   Drinks caffeine.   Smoke alarm in the home. Wears her seatbelt.   Feels safe in her relationships.   Two story home   Right handed    Social Determinants of Health   Financial Resource Strain: Not on file  Food Insecurity: Not on file  Transportation Needs: Not on file  Physical Activity: Not on file  Stress: Not on file  Social Connections: Not on file     Review of Systems: A 12 point ROS discussed and pertinent positives are indicated in the HPI above.  All other systems are negative.  Review of Systems  Vital Signs: BP 129/87   Pulse (!) 105   Temp 97.7 F (36.5 C) (Oral)   Resp 19   Ht $R'5\' 3"'CG$  (1.6 m)   Wt 69.9 kg   SpO2 99%   BMI 27.28 kg/m   Physical Exam Constitutional:      General: She is not in acute distress.    Appearance: Normal appearance. She is not toxic-appearing.  HENT:     Mouth/Throat:     Mouth: Mucous membranes are moist.     Pharynx: Oropharynx is clear.  Cardiovascular:     Rate and Rhythm: Normal rate and regular rhythm.     Heart sounds: Normal heart sounds.  Pulmonary:     Effort: Pulmonary effort  is normal. No respiratory distress.     Breath sounds: Normal breath sounds.  Musculoskeletal:     Comments: Mildly tender mid thoracic spine region. No palpable defects or abnormalities. No overlying skin changes.  Neurological:     General: No focal deficit present.     Mental Status: She is alert and oriented to person, place, and time.  Psychiatric:        Mood and  Affect: Mood normal.        Thought Content: Thought content normal.    Imaging: MR THORACIC SPINE W WO CONTRAST  Result Date: 03/06/2021 CLINICAL DATA:  70 year old female with myeloproliferative disorder, hematologic malignancy. Severe thrombocytopenia. Persistent back pain and leg weakness. EXAM: MRI THORACIC WITHOUT AND WITH CONTRAST TECHNIQUE: Multiplanar and multiecho pulse sequences of the thoracic spine were obtained without and with intravenous contrast. CONTRAST:  106mL GADAVIST GADOBUTROL 1 MMOL/ML IV SOLN COMPARISON:  Chest CT 10/29/2020 FINDINGS: Limited cervical spine imaging: Preserved cervical lordosis. Disc and endplate degeneration appears age-appropriate. Diffusely decreased marrow signal in the cervical spine, see below. No destructive cervical spine lesion is evident. Thoracic spine segmentation:  Normal on the comparison. Alignment:  Stable thoracic kyphosis.  No spondylolisthesis. Vertebrae: Compression fracture of the T7 vertebral body with about 15% loss of vertebral body height. Compression fracture of the T8 vertebral body with greater up to 30% loss of vertebral body height. No retropulsion of bone at either level. Patchy marrow edema and enhancement at both levels, slightly more diffuse at T7. And background abnormally decreased T1 marrow signal throughout the visible spine and ribs. But no other levels of marrow edema or abnormal marrow enhancement. Cord: Normal. Capacious thoracic spinal canal at most levels. No abnormal intradural enhancement. No dural thickening. Paraspinal and other soft tissues: There  is confluent paraspinal soft tissue edema at T7 and T8, maximal at the former on the left (series 21, image 24) where the pre contrast T1 signal raises the possibility of extraosseous extension of tumor there. But that is not corroborated on T2 imaging. And either confluent soft tissue edema or small paraspinal hematoma is suspected there. Negative visible thoracic and upper abdominal viscera. Disc levels: Most thoracic disc levels are normal for age. However, there is occasional mild degenerative thoracic facet and ligament flavum hypertrophy - and this includes T7-T8 and T8-T9. At T8-T9 there is a small left lateral recess disc protrusion (series 19, image 31) superimposed on the fracture. And combined with facet hypertrophy there is moderate bilateral T8 neural foraminal stenosis. But no spinal stenosis. At T11-T12 there is a small left paracentral disc protrusion. No associated stenosis. IMPRESSION: 1. Acute or subacute pathologic compression fractures of T7 and T8. Greater loss of height at the latter (30%) and up to moderate bilateral T8 neural foraminal stenosis, although primarily degenerative in nature with no significant retropulsion of bone. No thoracic spinal stenosis. 2. Diffusely abnormal marrow signal in the visible spine and ribs. This could reflect a diffuse malignant marrow replacement, versus a generalized red marrow reactivation in the setting of anemia/thrombocytopenia 3. There is no extraosseous extension of any abnormal soft tissue from the affected vertebrae, although that possibility was raised at T7 but felt instead to be paraspinal edema or hematoma related to #1. Electronically Signed   By: Genevie Ann M.D.   On: 03/06/2021 12:04   DG CHEST PORT 1 VIEW  Result Date: 03/07/2021 CLINICAL DATA:  Back pain EXAM: PORTABLE CHEST 1 VIEW COMPARISON:  01/04/2021 FINDINGS: Right PICC line is in place with the tip in the SVC. Low lung volumes with bibasilar atelectasis. Heart is normal size. No  effusions or acute bony abnormality. IMPRESSION: Right PICC line tip in the SVC. Low lung volumes, bibasilar atelectasis. Electronically Signed   By: Rolm Baptise M.D.   On: 03/07/2021 22:06    Labs:  CBC: Recent Labs    03/01/21 1301 03/04/21 0737 03/07/21 1740 03/08/21 0255  WBC 6.7 8.8  15.3* 20.6*  HGB 8.8* 8.4* 9.3* 9.7*  HCT 27.9* 26.5* 29.5* 30.4*  PLT 10* <5* 6* 5*    COAGS: Recent Labs    10/28/20 1806  INR 1.2    BMP: Recent Labs    03/01/21 1301 03/04/21 0737 03/07/21 1740 03/08/21 0255  NA 140 136 134* 135  K 4.1 4.1 3.6 3.7  CL 108 104 104 104  CO2 24 24 21* 24  GLUCOSE 182* 219* 344* 268*  BUN $Re'16 23 15 14  'Fap$ CALCIUM 8.9 9.4 8.8* 8.9  CREATININE 0.85 0.86 0.79 0.64  GFRNONAA >60 >60 >60 >60    LIVER FUNCTION TESTS: Recent Labs    02/26/21 0845 03/01/21 1301 03/04/21 0737 03/07/21 1740  BILITOT 0.7 0.7 0.8 1.1  AST 13* 18 12* 14*  ALT $Re'12 16 12 17  'hgD$ ALKPHOS 128* 140* 126 98  PROT 7.6 7.8 7.7 7.0  ALBUMIN 3.4* 3.5 3.5 3.1*    TUMOR MARKERS: No results for input(s): AFPTM, CEA, CA199, CHROMGRNA in the last 8760 hours.  Assessment and Plan: Symptomatic T7 and T8 compression fractures. Imaging reviewed with Dr. Karenann Cai. Pt appears to be good technical candidate for procedure. Severe thrombocytopenia secondary to underlying [name] and therapy. Not much response to therapies and/or transfusions so far. Pt and daughter states only few times has PLT count temporarily gotten to 20-30k. Ideally would prefer if PLT count was >50k due to risk of perioperative bleeding/hematoma. Discussed pursuing conservative management but pt and daughter wish to pursue KP/VP if at all possible. Could consider procedure if PLT count could get to 30k and then we could arrange for more PLT to be transfused during procedure. VP/KP procedure was discussed with pt and daughter in detail and they are willing to proceed if it is felt safe. They understand it may  take some time and likely numerous PLT transfusions. NIR will continue following pt and labs and plan accordingly.  Thank you for this interesting consult.  I greatly enjoyed meeting Kathleen Gibson and look forward to participating in their care.  A copy of this report was sent to the requesting provider on this date.  Electronically Signed: Ascencion Dike, PA-C 03/08/2021, 12:50 PM   I spent a total of 30 minutes in face to face in clinical consultation, greater than 50% of which was counseling/coordinating care for T7 and T8 compression fx

## 2021-03-08 NOTE — Progress Notes (Signed)
IR aware of request for T7 and T8 kyphoplasty. Chart reviewed, multiple things in ongoing workup. Hx of MDS/[name], currently on treatment, with thrombocytopenia requiring frequent PLT transfusions. PLTs 5k, has received 4 total units PLTs since admission, await follow up labs. Rising WBC, now 20k, suspected reactive but cultures in progress.  Imaging reviewed with Dr. Karenann Cai. Good candidate for procedure. Will submit request for insurance approval for T7 and T8 KP procedure. Need to have PLTs 50k or > to proceed with procedure.  Ascencion Dike PA-C Interventional Radiology 03/08/2021 10:13 AM

## 2021-03-09 ENCOUNTER — Encounter (HOSPITAL_COMMUNITY): Payer: Self-pay | Admitting: Internal Medicine

## 2021-03-09 DIAGNOSIS — M4854XD Collapsed vertebra, not elsewhere classified, thoracic region, subsequent encounter for fracture with routine healing: Secondary | ICD-10-CM | POA: Diagnosis not present

## 2021-03-09 DIAGNOSIS — M549 Dorsalgia, unspecified: Secondary | ICD-10-CM | POA: Diagnosis not present

## 2021-03-09 DIAGNOSIS — C9310 Chronic myelomonocytic leukemia not having achieved remission: Secondary | ICD-10-CM | POA: Diagnosis not present

## 2021-03-09 DIAGNOSIS — M8458XA Pathological fracture in neoplastic disease, other specified site, initial encounter for fracture: Secondary | ICD-10-CM

## 2021-03-09 DIAGNOSIS — D696 Thrombocytopenia, unspecified: Secondary | ICD-10-CM

## 2021-03-09 DIAGNOSIS — E1142 Type 2 diabetes mellitus with diabetic polyneuropathy: Secondary | ICD-10-CM

## 2021-03-09 DIAGNOSIS — D72828 Other elevated white blood cell count: Secondary | ICD-10-CM

## 2021-03-09 LAB — PREPARE PLATELET PHERESIS
Unit division: 0
Unit division: 0
Unit division: 0

## 2021-03-09 LAB — GLUCOSE, CAPILLARY
Glucose-Capillary: 217 mg/dL — ABNORMAL HIGH (ref 70–99)
Glucose-Capillary: 257 mg/dL — ABNORMAL HIGH (ref 70–99)

## 2021-03-09 LAB — BPAM PLATELET PHERESIS
Blood Product Expiration Date: 202209262359
Blood Product Expiration Date: 202209262359
Blood Product Expiration Date: 202209262359
ISSUE DATE / TIME: 202209260445
ISSUE DATE / TIME: 202209260445
ISSUE DATE / TIME: 202209260445
Unit Type and Rh: 6200
Unit Type and Rh: 6200
Unit Type and Rh: 6200

## 2021-03-09 LAB — CBC
HCT: 26.8 % — ABNORMAL LOW (ref 36.0–46.0)
HCT: 27.5 % — ABNORMAL LOW (ref 36.0–46.0)
Hemoglobin: 8.3 g/dL — ABNORMAL LOW (ref 12.0–15.0)
Hemoglobin: 8.5 g/dL — ABNORMAL LOW (ref 12.0–15.0)
MCH: 27.9 pg (ref 26.0–34.0)
MCH: 27.7 pg (ref 26.0–34.0)
MCHC: 31 g/dL (ref 30.0–36.0)
MCHC: 30.9 g/dL (ref 30.0–36.0)
MCV: 90.2 fL (ref 80.0–100.0)
MCV: 89.6 fL (ref 80.0–100.0)
Platelets: <5 10*3/uL — CL (ref 150–400)
Platelets: 6 10*3/uL — CL (ref 150–400)
RBC: 2.97 MIL/uL — ABNORMAL LOW (ref 3.87–5.11)
RBC: 3.07 MIL/uL — ABNORMAL LOW (ref 3.87–5.11)
RDW: 18.1 % — ABNORMAL HIGH (ref 11.5–15.5)
RDW: 18.3 % — ABNORMAL HIGH (ref 11.5–15.5)
WBC: 14.4 10*3/uL — ABNORMAL HIGH (ref 4.0–10.5)
WBC: 21.8 10*3/uL — ABNORMAL HIGH (ref 4.0–10.5)
nRBC: 0 % (ref 0.0–0.2)
nRBC: 0 % (ref 0.0–0.2)

## 2021-03-09 LAB — BASIC METABOLIC PANEL
Anion gap: 5 (ref 5–15)
BUN: 12 mg/dL (ref 8–23)
CO2: 27 mmol/L (ref 22–32)
Calcium: 8.3 mg/dL — ABNORMAL LOW (ref 8.9–10.3)
Chloride: 103 mmol/L (ref 98–111)
Creatinine, Ser: 0.63 mg/dL (ref 0.44–1.00)
GFR, Estimated: >60 mL/min (ref >60)
Glucose, Bld: 176 mg/dL — ABNORMAL HIGH (ref 70–99)
Potassium: 3.8 mmol/L (ref 3.5–5.1)
Sodium: 135 mmol/L (ref 135–145)

## 2021-03-09 LAB — CBG MONITORING, ED: Glucose-Capillary: 234 mg/dL — ABNORMAL HIGH (ref 70–99)

## 2021-03-09 MED ORDER — CYCLOBENZAPRINE HCL 5 MG PO TABS
5.0000 mg | ORAL_TABLET | Freq: Three times a day (TID) | ORAL | Status: DC | PRN
  Administered 2021-03-11: 5 mg via ORAL
  Filled 2021-03-09: qty 1

## 2021-03-09 MED ORDER — CHLORHEXIDINE GLUCONATE CLOTH 2 % EX PADS
6.0000 | MEDICATED_PAD | Freq: Every day | CUTANEOUS | Status: DC
  Administered 2021-03-09 – 2021-03-12 (×4): 6 via TOPICAL

## 2021-03-09 MED ORDER — INSULIN GLARGINE-YFGN 100 UNIT/ML ~~LOC~~ SOLN
10.0000 [IU] | Freq: Every day | SUBCUTANEOUS | Status: DC
  Administered 2021-03-09: 10 [IU] via SUBCUTANEOUS
  Filled 2021-03-09 (×2): qty 0.1

## 2021-03-09 MED ORDER — PREDNISONE 20 MG PO TABS
60.0000 mg | ORAL_TABLET | Freq: Once | ORAL | Status: AC
  Administered 2021-03-09: 60 mg via ORAL
  Filled 2021-03-09: qty 3

## 2021-03-09 MED ORDER — SODIUM CHLORIDE 0.9% IV SOLUTION: Freq: Once | INTRAVENOUS | Status: AC

## 2021-03-09 NOTE — Discharge Summary (Signed)
Physician Discharge Summary  Kathleen Gibson:[id number] DOB: [birth year] DOA: 03/07/2021  PCP: Natalia Leatherwood, DO  Admit date: 03/07/2021 Discharge date: 03/09/2021  Recommendations for Outpatient Follow-up:  Transfer to Leukemia service at [facility]  Discharge Diagnoses: Principal diagnosis is #1 [name] with severe thrombocytopenia Pathological fractures of T-7,T--8 Sinus tachycardia Intractable back pain DM II with hyperglycemia Hypertension Polyneuropathy due to DM II   Discharge Condition: Fair  Disposition: [facility]  Diet recommendation: Modified carbohydrate  Filed Weights   03/07/21 1635 03/07/21 1647 03/09/21 1418  Weight: 69.9 kg 69.9 kg 69.2 kg    History of present illness:   Kathleen Gibson is a 70 y.o. female with medical history significant for [name], severe thrombocytopenia with frequent transfusions Mondays and Thursdays, last chemotherapy was last week, chronic constipation, chronic normocytic anemia, chronic back pain since August 2022, type 2 diabetes with polyneuropathy, essential hypertension, who presented to [facility] ED with complaints of worsening back pain despite home Flexeril and Tylenol 3.  No trauma, no falls.  States after her last chemotherapy last week she became increasingly more fatigued and constipated.  Endorses that she had bowel movements all night.  She presents to the ED today due to her back pain.  MRI of the thoracic spine with and without contrast on 03/05/2021 showed acute or subacute pathological compression fractures of T7 and T8.  Greater loss of height at the latter 30% and up to moderate bilateral T8 neuroforaminal stenosis, although primarily degenerative in nature with no significant retropulsion of bone.  No thoracic spinal stenosis.  Diffusely abnormal marrow signal in the visible spine and ribs.  This could reflect a diffuse malignant marrow replacement, versus a generalized red marrow reactivation in the setting of anemia/thrombocytopenia.  There is  no extraosseous extension of any abnormal soft tissue from the affected vertebrae, although that possibility was raised at T7 but felt instead to be paraspinal edema or hematoma related to acute or subacute pathological compression fracture of T7 and T8.  She received 1.5 mg of IV Dilaudid in the ED with improvement of her symptomatology.  Platelet count 6 K on presentation.  1 unit of platelets ordered to be transfused by EDP.  TRH, hospitalist team, was asked to admit.   ED Course: Temperature 98.7.  BP 128/83, pulse 126, respiration rate 16, O2 saturation 99% on room air.  Lab studies significant for serum sodium 124, serum bicarb 21, glucose 344, creatinine 0.79 with GFR greater than 60.  Albumin 3.1.  WBC 15.3, hemoglobin 9.3, MCV 87.  Platelet count 6.  Hospital Course:  The patient received 4 pheresis packs of platelets over 9/24-9/25. Platelets this am were 5. Hematology/Oncology were consutled. They have ordered a dose of NPlate for the patient to receive today. They recommend no further platelet transfusions today. The patient has been evaluated by IR for possible vertebroplasty. They state that she would be a good candidate for the procedure, but that her platelets would have to be greater than 50.  She is receiving pain control. Platelets remain low. Holding off on further transfusions as no active bleeding as per recommendation of hematology/oncology. Will start prednisone for inflammation and flexeril for muscle spasm.  The patient is on waiting list for bed at [facility]. She will be transferred when bed is available.  Today's assessment: S: The patient is resting comfortably. No new complaints.  O: Vitals:  Vitals:   03/09/21 1418 03/09/21 1639  BP: 116/89 118/64  Pulse: (!) 103 90  Resp:  18  Temp: 98.2 F (36.8 C) 98.4 F (36.9 C)  SpO2: 95% 93%   Exam:   Constitutional:  The patient is awake and alert. She is oriented to self and place, but not to date. Respiratory:  CTA  bilaterally, no w/r/r.  Respiratory effort normal. No retractions or accessory muscle use Cardiovascular:  RRR, no m/r/g No LE extremity edema   Normal pedal pulses Abdomen:  Abdomen appears normal; no tenderness or masses No hernias No HSM Musculoskeletal:  Digits/nails BUE: no clubbing, cyanosis, petechiae, infection exam of joints, bones, muscles of at least one of following: head/neck, RUE, LUE, RLE, LLE   Skin:  No rashes, lesions, ulcers Positive for multiple ecchymoses. palpation of skin: no induration or nodules Neurologic:  CN 2-12 intact Sensation all 4 extremities intact Psychiatric:  Mental status: Confused Mood, affect appropriate Orientation to person, place, but not time.   Discharge Instructions  Transfer to [facility] Discharge Instructions     Diet Carb Modified   Complete by: As directed       Allergies as of 03/09/2021       Reactions   Asa [aspirin] Other (See Comments)   Contraindicated d/t low plts   Nsaids Other (See Comments)   Contraindicated d/t low plts        Medication List     STOP taking these medications    acetaminophen 650 MG CR tablet Commonly known as: TYLENOL   acetaminophen-codeine 300-30 MG tablet Commonly known as: TYLENOL #3   ammonium lactate 12 % lotion Commonly known as: LAC-HYDRIN   cyclobenzaprine 5 MG tablet Commonly known as: FLEXERIL   ferrous sulfate 325 (65 FE) MG tablet   glucose monitoring kit monitoring kit   LORazepam 1 MG tablet Commonly known as: ATIVAN   metoprolol succinate 50 MG 24 hr tablet Commonly known as: TOPROL-XL   naloxone 4 MG/0.1ML Liqd nasal spray kit Commonly known as: NARCAN   senna 8.6 MG Tabs tablet Commonly known as: SENOKOT   sitaGLIPtin-metformin 50-1000 MG tablet Commonly known as: JANUMET   traMADol 50 MG tablet Commonly known as: ULTRAM   zolpidem 5 MG tablet Commonly known as: AMBIEN       TAKE these medications    gabapentin 300 MG  capsule Commonly known as: NEURONTIN Take 2 capsules (600 mg total) by mouth 2 (two) times daily.       Allergies  Allergen Reactions   Asa [Aspirin] Other (See Comments)    Contraindicated d/t low plts   Nsaids Other (See Comments)    Contraindicated d/t low plts    The results of significant diagnostics from this hospitalization (including imaging, microbiology, ancillary and laboratory) are listed below for reference.    Significant Diagnostic Studies: MR THORACIC SPINE W WO CONTRAST  Result Date: 03/06/2021 CLINICAL DATA:  70 year old female with myeloproliferative disorder, hematologic malignancy. Severe thrombocytopenia. Persistent back pain and leg weakness. EXAM: MRI THORACIC WITHOUT AND WITH CONTRAST TECHNIQUE: Multiplanar and multiecho pulse sequences of the thoracic spine were obtained without and with intravenous contrast. CONTRAST:  32mL GADAVIST GADOBUTROL 1 MMOL/ML IV SOLN COMPARISON:  Chest CT 10/29/2020 FINDINGS: Limited cervical spine imaging: Preserved cervical lordosis. Disc and endplate degeneration appears age-appropriate. Diffusely decreased marrow signal in the cervical spine, see below. No destructive cervical spine lesion is evident. Thoracic spine segmentation:  Normal on the comparison. Alignment:  Stable thoracic kyphosis.  No spondylolisthesis. Vertebrae: Compression fracture of the T7 vertebral body with about 15% loss of vertebral body height. Compression fracture  of the T8 vertebral body with greater up to 30% loss of vertebral body height. No retropulsion of bone at either level. Patchy marrow edema and enhancement at both levels, slightly more diffuse at T7. And background abnormally decreased T1 marrow signal throughout the visible spine and ribs. But no other levels of marrow edema or abnormal marrow enhancement. Cord: Normal. Capacious thoracic spinal canal at most levels. No abnormal intradural enhancement. No dural thickening. Paraspinal and other soft  tissues: There is confluent paraspinal soft tissue edema at T7 and T8, maximal at the former on the left (series 21, image 24) where the pre contrast T1 signal raises the possibility of extraosseous extension of tumor there. But that is not corroborated on T2 imaging. And either confluent soft tissue edema or small paraspinal hematoma is suspected there. Negative visible thoracic and upper abdominal viscera. Disc levels: Most thoracic disc levels are normal for age. However, there is occasional mild degenerative thoracic facet and ligament flavum hypertrophy - and this includes T7-T8 and T8-T9. At T8-T9 there is a small left lateral recess disc protrusion (series 19, image 31) superimposed on the fracture. And combined with facet hypertrophy there is moderate bilateral T8 neural foraminal stenosis. But no spinal stenosis. At T11-T12 there is a small left paracentral disc protrusion. No associated stenosis. IMPRESSION: 1. Acute or subacute pathologic compression fractures of T7 and T8. Greater loss of height at the latter (30%) and up to moderate bilateral T8 neural foraminal stenosis, although primarily degenerative in nature with no significant retropulsion of bone. No thoracic spinal stenosis. 2. Diffusely abnormal marrow signal in the visible spine and ribs. This could reflect a diffuse malignant marrow replacement, versus a generalized red marrow reactivation in the setting of anemia/thrombocytopenia 3. There is no extraosseous extension of any abnormal soft tissue from the affected vertebrae, although that possibility was raised at T7 but felt instead to be paraspinal edema or hematoma related to #1. Electronically Signed   By: Genevie Ann M.D.   On: 03/06/2021 12:04   DG CHEST PORT 1 VIEW  Result Date: 03/07/2021 CLINICAL DATA:  Back pain EXAM: PORTABLE CHEST 1 VIEW COMPARISON:  01/04/2021 FINDINGS: Right PICC line is in place with the tip in the SVC. Low lung volumes with bibasilar atelectasis. Heart is  normal size. No effusions or acute bony abnormality. IMPRESSION: Right PICC line tip in the SVC. Low lung volumes, bibasilar atelectasis. Electronically Signed   By: Rolm Baptise M.D.   On: 03/07/2021 22:06    Microbiology: Recent Results (from the past 240 hour(s))  SARS CORONAVIRUS 2 (TAT 6-24 HRS) Nasopharyngeal Nasopharyngeal Swab     Status: None   Collection Time: 03/08/21  9:25 AM   Specimen: Nasopharyngeal Swab  Result Value Ref Range Status   SARS Coronavirus 2 NEGATIVE NEGATIVE Final    Comment: (NOTE) SARS-CoV-2 target nucleic acids are NOT DETECTED.  The SARS-CoV-2 RNA is generally detectable in upper and lower respiratory specimens during the acute phase of infection. Negative results do not preclude SARS-CoV-2 infection, do not rule out co-infections with other pathogens, and should not be used as the sole basis for treatment or other patient management decisions. Negative results must be combined with clinical observations, patient history, and epidemiological information. The expected result is Negative.  Fact Sheet for Patients: SugarRoll.be  Fact Sheet for Healthcare Providers: https://www.woods-mathews.com/  This test is not yet approved or cleared by the [place] FDA and  has been authorized for detection and/or diagnosis of SARS-CoV-2 by  FDA under an Emergency Use Authorization (EUA). This EUA will remain  in effect (meaning this test can be used) for the duration of the COVID-19 declaration under Se ction 564(b)(1) of the Act, 21 U.S.C. section 360bbb-3(b)(1), unless the authorization is terminated or revoked sooner.  Performed at [facility], [place] [address], [place], [place] [address]      Labs: Basic Metabolic Panel: Recent Labs  Lab 03/04/21 0737 03/07/21 1740 03/08/21 0255 03/09/21 0609  NA 136 134* 135 135  K 4.1 3.6 3.7 3.8  CL 104 104 104 103  CO2 24 21* 24 27  GLUCOSE 219* 344* 268*  176*  BUN $Re'23 15 14 12  'FJj$ CREATININE 0.86 0.79 0.64 0.63  CALCIUM 9.4 8.8* 8.9 8.3*  MG  --   --  1.7  --   PHOS  --   --  3.6  --    Liver Function Tests: Recent Labs  Lab 03/04/21 0737 03/07/21 1740  AST 12* 14*  ALT 12 17  ALKPHOS 126 98  BILITOT 0.8 1.1  PROT 7.7 7.0  ALBUMIN 3.5 3.1*   No results for input(s): LIPASE, AMYLASE in the last 168 hours. No results for input(s): AMMONIA in the last 168 hours. CBC: Recent Labs  Lab 03/04/21 0737 03/07/21 1740 03/08/21 0255 03/08/21 1820 03/09/21 0609 03/09/21 1454  WBC 8.8 15.3* 20.6* 15.7* 14.4* 21.8*  NEUTROABS 5.5 9.4* 14.2*  --   --   --   HGB 8.4* 9.3* 9.7* 8.1* 8.3* 8.5*  HCT 26.5* 29.5* 30.4* 26.3* 26.8* 27.5*  MCV 86.9 87.8 87.9 89.8 90.2 89.6  PLT <5* 6* 5* 6* <5* 6*   Cardiac Enzymes: No results for input(s): CKTOTAL, CKMB, CKMBINDEX, TROPONINI in the last 168 hours. BNP: BNP (last 3 results) No results for input(s): BNP in the last 8760 hours.  ProBNP (last 3 results) No results for input(s): PROBNP in the last 8760 hours.  CBG: Recent Labs  Lab 03/08/21 1202 03/08/21 1817 03/08/21 2108 03/09/21 1220 03/09/21 1643  GLUCAP 227* 273* 181* 234* 217*    Active Problems:   Thrombocytopenia (HCC)   Intractable back pain   Time coordinating discharge: 38 minutes  Signed:        Niveah Boerner, DO [facility]  03/09/2021, 7:18 PM

## 2021-03-09 NOTE — Progress Notes (Addendum)
PROGRESS NOTE  Kathleen Gibson ZTI:[id number] DOB: 1950-11-28 DOA: 03/07/2021 PCP: [name], DO  Brief History   Kathleen Gibson is a 70 y.o. female with medical history significant for [name], severe thrombocytopenia with frequent transfusions Mondays and Thursdays, last chemotherapy was last week, chronic constipation, chronic normocytic anemia, chronic back pain since August 2022, type 2 diabetes with polyneuropathy, essential hypertension, who presented to [facility] ED with complaints of worsening back pain despite home Flexeril and Tylenol 3.  No trauma, no falls.  States after her last chemotherapy last week she became increasingly more fatigued and constipated.  Endorses that she had bowel movements all night.  She presents to the ED today due to her back pain.  MRI of the thoracic spine with and without contrast on 03/05/2021 showed acute or subacute pathological compression fractures of T7 and T8.  Greater loss of height at the latter 30% and up to moderate bilateral T8 neuroforaminal stenosis, although primarily degenerative in nature with no significant retropulsion of bone.  No thoracic spinal stenosis.  Diffusely abnormal marrow signal in the visible spine and ribs.  This could reflect a diffuse malignant marrow replacement, versus a generalized red marrow reactivation in the setting of anemia/thrombocytopenia.  There is no extraosseous extension of any abnormal soft tissue from the affected vertebrae, although that possibility was raised at T7 but felt instead to be paraspinal edema or hematoma related to acute or subacute pathological compression fracture of T7 and T8.  She received 1.5 mg of IV Dilaudid in the ED with improvement of her symptomatology.  Platelet count 6 K on presentation.  1 unit of platelets ordered to be transfused by EDP.  TRH, hospitalist team, was asked to admit.   ED Course: Temperature 98.7.  BP 128/83, pulse 126, respiration rate 16, O2 saturation 99% on room air.  Lab studies  significant for serum sodium 124, serum bicarb 21, glucose 344, creatinine 0.79 with GFR greater than 60.  Albumin 3.1.  WBC 15.3, hemoglobin 9.3, MCV 87.  Platelet count 6.   The patient received 4 pheresis packs of platelets over 9/24-9/25. Platelets this am were 5. Hematology/Oncology were consutled. They have ordered a dose of NPlate for the patient to receive today. They recommend no further platelet transfusions today. The patient has been evaluated by IR for possible vertebroplasty. They state that she would be a good candidate for the procedure, but that her platelets would have to be greater than 50.  Consultants  Interventional radiology Hematology Oncology  Procedures  None  Antibiotics   Anti-infectives (From admission, onward)    None      Subjective  The patient is resting on the gurney. No new complaints.  Objective   Vitals:  Vitals:   03/09/21 1340 03/09/21 1418  BP: 104/78   Pulse: (!) 103 (!) 103  Resp: 20   Temp: 98.7 F (37.1 C) 98.2 F (36.8 C)  SpO2: 97% 95%    Exam:  Constitutional:  The patient is awake and alert. She is oriented to self and place, but not to date. Respiratory:  CTA bilaterally, no w/r/r.  Respiratory effort normal. No retractions or accessory muscle use Cardiovascular:  RRR, no m/r/g No LE extremity edema   Normal pedal pulses Abdomen:  Abdomen appears normal; no tenderness or masses No hernias No HSM Musculoskeletal:  Digits/nails BUE: no clubbing, cyanosis, petechiae, infection exam of joints, bones, muscles of at least one of following: head/neck, RUE, LUE, RLE, LLE   Skin:  No rashes, lesions, ulcers Positive for multiple ecchymoses. palpation of skin: no induration or nodules Neurologic:  CN 2-12 intact Sensation all 4 extremities intact Psychiatric:  Mental status: Confused Mood, affect appropriate Orientation to person, place, but not time.   I have personally reviewed the following:   Today's Data   Vitals Lab Data  CBC, BMP  Imaging  Thoracic MRI X-ray of the chest  Cardiology Data  EKG  Scheduled Meds:  sodium chloride   Intravenous Once   Chlorhexidine Gluconate Cloth  6 each Topical Daily   gabapentin  600 mg Oral BID   insulin aspart  0-5 Units Subcutaneous QHS   insulin aspart  0-9 Units Subcutaneous TID WC   insulin glargine-yfgn  10 Units Subcutaneous Daily   metoprolol succinate  25 mg Oral Daily   senna-docusate  2 tablet Oral QHS   Continuous Infusions:    Active Problems:   Thrombocytopenia (HCC)   Intractable back pain   LOS: 1 day   A & P  Intractable back pain secondary to T7-T8 pathological fracture Received IV Dilaudid as needed in the ED for severe pain, continue Bowel regimen to avoid opiate induced constipation Continue pain control. IR has evaluated the patient and feels that she would be appropriate for verteberoplasty, but not until her platelets are greater than 50K.  Flexeril has been started for muscle spasms and prednisone for inflammation. TLSO brace ordered.   Severe thrombocytopenia/[name] Presented with platelet count of 6000. S/P transfusion of 4 units since 03/06/2021. Platelets are less than 5000 this am. Heme/Onc gave the patient one dose of NPlate on 0/63/0160. They have not recommended further transfusion. No clinical signs of bleeding. I appreciate their help. Plan is for the patient to transfer to [facility]. She is on a wait list for a bed. She has been accepted by Dr. Mariea Clonts on the Leukemia service. She is on a waiting list for a bed there   Leukocytosis, suspect reactive Due to [name]. No infectious source apparent.  Likely due to [name].   Sinus tachycardia Obtain twelve-lead EKG. Resolved. Resume home Toprol-XL Monitor on telemetry   SIRS. No clear evidence of active infective process Afebrile and non toxic appearing Presented with WBC 15.3 K, HR 102 UA negative  CXR negative   Type 2 diabetes with hyperglycemia Presented  with serum glucose greater than 300. Obtain A1c Start insulin sliding scale. Have added Lantus 10 units daily. Gentle IV fluid hydration LR at 30 cc/hr x 1 day   Pseudo hyponatremia Serum sodium 134, serum glucose 344 Corrected sodium for hyperglycemia 138. Resolved.   Diabetic polyneuropathy Resume home gabapentin.   Essential hypertension Resume home Toprol-XL.   Iron deficiency anemia Resume home ferrous sulfate.   Moderate protein calorie malnutrition Albumin 3.1 BMI 27 Encourage oral intake.  I have seen and have examined this patient myself. I have spent 42 minutes in her evaluation and care.   DVT prophylaxis: SCDs. Code Status: DNR, as stated by the patient herself. Family Communication: None at bedside. Disposition Plan: Admitted to telemetry medical. Consults called: Medical oncology consulted by EDP. Admission status: Observation status.   Status is: Observation   Dispo:            Patient From: Home            Planned Disposition: Transfer to [facility] when bed is available. Patient is on wait list.          Medically stable for discharge: No  Kenasia Scheller, DO [facility] Direct contact: see [email or web address]  7PM-7AM contact night coverage as above 03/09/2021, 7:07 PM  LOS: 0 days

## 2021-03-09 NOTE — Progress Notes (Signed)
Date and time results received: 03/09/21 1545 (use smartphrase ".now" to insert current time)  Test: platelet Critical Value: 6  Name of Provider Notified: swayze, ava, do  Orders Received? Or Actions Taken?: Actions Taken: awaiting md response

## 2021-03-09 NOTE — Progress Notes (Signed)
Orthopedic Tech Progress Note Patient Details:  SUMMERS BUENDIA July 19, 1950 [id number]  Ortho Devices Type of Ortho Device: Other (comment) Ortho Device/Splint Location: TLSO. Delivered to room. Ortho Device/Splint Interventions: Application, Adjustment, Ordered   Post Interventions Patient Tolerated: Well Instructions Provided: Care of device, Adjustment of device  Karolee Stamps 03/09/2021, 11:15 PM

## 2021-03-09 NOTE — Progress Notes (Signed)
HEMATOLOGY-ONCOLOGY PROGRESS NOTE  SUBJECTIVE: [name] is receiving a unit of HLA matched platelets at the time of my visit.  No bleeding reported.  She is awaiting transfer to [facility].  She has no other complaints today.  Oncology History  [name] (chronic myelomonocytic leukemia) ([place])  10/29/2020 Imaging   CT CAP  IMPRESSION: 1. Splenomegaly with pathologically enlarged lymph nodes above and below the diaphragm, with overall stable to minimally increased abdominal adenopathy and interval progression of the pelvic adenopathy. 2. Small volume abdominopelvic ascites with diffuse mesenteric stranding. 3. Scattered bilateral pulmonary micro nodules measuring 1-2 mm. 4. Distended gallbladder with some layering hyperdense material representing layering sludge and tiny stones seen on prior ultrasound. 5. Aortic atherosclerosis.   10/30/2020 Pathology Results   DIAGNOSIS:   BONE MARROW, ASPIRATE, CLOT, CORE:  -  Hypercellular bone marrow with panhyperplasia, atypia, and no  increase in blasts  -  See comment   PERIPHERAL BLOOD:  -  Marked thrombocytopenia  -  Absolute monocytosis  -  Normocytic anemia  -  See CBC data and comment   COMMENT:  The bone marrow is hypercellular for age (approximately 80%) with myeloid hyperplasia with maturational left shift, erythroid hyperplasia, and increased megakaryocytes.  Mild multilineage atypia is present. Blasts are not increased on aspirate smears (1% by manual differential count) or by CD34 immunohistochemistry on the core biopsy.  Concurrent flow cytometric analysis of the bone marrow aspirate demonstrates increased monocytes, and no increase in blasts or abnormal lymphoid population (see [id number]).  Monocytes are also increased in peripheral  blood, persistent per electronic medical record.  In aggregate, the  findings raise the possibility of a myeloid neoplasm with the  differential diagnosis including a low-grade myelodysplastic  syndrome and chronic myelomonocytic leukemia (dysplastic type).    ADDENDUM:  A reticulin special stain performed on the bone marrow core biopsy reveals no significant increase in reticulin fibrosis.  ADDENDUM:  CYTOGENETIC RESULTS:  Karyotype: 46,XX[20]  Interpretation: NORMAL FEMALE KARYOTYPE   FISH RESULTS:  Results: NORMAL   ADDENDUM:  CD123 immunohistochemistry performed on the core biopsy highlights scattered aggregates of positively staining cells consistent with plasmacytoid dendritic cells.    10/30/2020 Pathology Results   DIAGNOSIS:   BONE MARROW; FLOW CYTOMETRIC ANALYSIS:  -  Increased monocytes  -  Scant B-cells present  -  No immunophenotypically aberrant T-cell population identified  -  No increase in blasts  -  See comment   COMMENT:  Monocytes are relatively increased (12% of all cells), without aberrant expression of CD56.  B-cells comprise <1% of total lymphocytes. CD34-positive blasts are not increased (<1% of all cells).  Correlation with concurrent morphology is recommended for complete diagnostic interpretation and overall blast enumeration (see [id number]).    10/30/2020 Pathology Results   FINAL MICROSCOPIC DIAGNOSIS:   A. LYMPH NODE, RIGHT AXILLARY, NEEDLE CORE BIOPSY:  -Lymphoid tissue present  -See comment   COMMENT:  The sections show several small needle core biopsy fragments of lymphoid tissue displaying degenerative cellular changes/necrosis and hence cannot be accurately evaluated.  Sample for flow cytometric analysis not available.  Immunohistochemical stain for CD3 and CD20 were performed with appropriate controls.  There is a mixture of T and B cells in their apparently respective compartments.  There is no definite metastatic malignancy.    11/11/2020 Pathology Results   DIAGNOSIS:   LEFT AXILLARY LYMPH NODE EXCISIONAL BIOPSY; FLOW CYTOMETRIC ANALYSIS:  -  No monotypic B-cell or immunophenotypically aberrant T-cell  population identified   -  See comment   COMMENT:  Flow cytometric analysis identifies B-cells with a normal kappa:lambda ratio of 1.7:1.  A subset of the polytypic B-cells expresses CD10. T-cells show a CD4:CD8 ratio of 3.3:1 without immunophenotypic aberrancy with the markers evaluated.  Although these results do not support the diagnosis of a clonal lymphoid population, sampling issues must always be considered when negative results are obtained, as focal lesions may not be represented in the specimen submitted.  In addition, flow cytometric immunophenotyping will not exclude other pathology if present (e.g. Hodgkin lymphoma, some T-cell lymphomas, metastatic and infectious diseases).   11/11/2020 Pathology Results   FINAL MICROSCOPIC DIAGNOSIS:   A. LYMPH NODE, LEFT AXILLARY, DISSECTION:  -  Follicular hyperplasia with interfollicular expansion  -  See comment   COMMENT:  Sections of the lymph nodes reveal generally preserved lymph node architecture.  There is follicular hyperplasia with some follicles showing increased hyalinization of germinal centers and mild concentric encircling of germinal centers with small lymphocytes (onion skinning). Interfollicular areas are expanded with increased vascular proliferation, small lymphocytes, plasma cells, histiocytic cells, and patchy neutrophils.  The lymph node capsule is variably thickened by fibrosis with few plasma cells.   A panel of immunohistochemical stains is performed for further  characterization.  CD3 and CD20/PAX5 highlight T-cell and B-cell  compartments, respectively.  Germinal centers are highlighted by CD10 and BCL6 with appropriate absent expression of BCL2.  CD5 is similar to CD3.  CD43 also stains the T-cells.  CD4-positive T-cells exceed CD8-positive T-cells.  CD30 highlights scattered immunoblasts.  CD21 reveals intact follicular dendritic cell meshworks.  CD68 highlights increased histiocytic cells.  HHV 8 is negative.  T. pallidum reveals no  definitive organisms.  Pancytokeratin is negative.  TdT stains rare cells.  CD138 highlights plasma cells which are not increased in number and show polytypic light chain expression by kappa/lambda in situ  hybridization.  EBV is negative by in situ hybridization.   Concurrent flow cytometric analysis is negative for a monoclonal B-cell or immunophenotypically aberrant T-cell population (see [id number]).   Together, the findings above are non-specific and can be seen in  reactive and infectious processes as well as Castleman disease.  There is no definitive morphologic or flow cytometric evidence of involvement by a lymphoproliferative disorder from the current workup.   12/17/2020 Initial Diagnosis   [name] (chronic myelomonocytic leukemia) ([place])   12/28/2020 -  Chemotherapy    Patient is on Treatment Plan: MYELODYSPLASIA  AZACITIDINE IV D1-5 Q28D          REVIEW OF SYSTEMS:   Constitutional: Denies fevers, chills  Eyes: Denies blurriness of vision Ears, nose, mouth, throat, and face: Denies mucositis or sore throat Respiratory: Denies cough, dyspnea or wheezes Cardiovascular: Denies palpitation, chest discomfort Gastrointestinal:  Denies nausea, heartburn or change in bowel habits Skin: She has a lesion at her right temple, has multiple ecchymoses over her body Lymphatics: Denies new lymphadenopathy  Neurological: No dizziness Extremities: No lower extremity edema All other systems were reviewed with the patient and are negative.  I have reviewed the past medical history, past surgical history, social history and family history with the patient and they are unchanged from previous note.   PHYSICAL EXAMINATION: ECOG PERFORMANCE STATUS: 2 - Symptomatic, <50% confined to bed  Vitals:   03/09/21 1340 03/09/21 1418  BP: 104/78   Pulse: (!) 103 (!) 103  Resp: 20   Temp: 98.7 F (37.1 C) 98.2 F (36.8 C)  SpO2: 97% 95%   Filed  Weights   03/07/21 1635 03/07/21 1647 03/09/21  1418  Weight: 69.9 kg 69.9 kg 69.2 kg    Intake/Output from previous day: No intake/output data recorded.  GENERAL:alert, no distress and comfortable SKIN: Multiple ecchymoses over her body EYES: normal, Conjunctiva are pink and non-injected, sclera clear NEURO: Alert and oriented x3.  LABORATORY DATA:  I have reviewed the data as listed CMP Latest Ref Rng & Units 03/09/2021 03/08/2021 03/07/2021  Glucose 70 - 99 mg/dL 176(H) 268(H) 344(H)  BUN 8 - 23 mg/dL $Remove'12 14 15  '[id number]$ Creatinine 0.44 - 1.00 mg/dL 0.63 0.64 0.79  Sodium 135 - 145 mmol/L 135 135 134(L)  Potassium 3.5 - 5.1 mmol/L 3.8 3.7 3.6  Chloride 98 - 111 mmol/L 103 104 104  CO2 22 - 32 mmol/L 27 24 21(L)  Calcium 8.9 - 10.3 mg/dL 8.3(L) 8.9 8.8(L)  Total Protein 6.5 - 8.1 g/dL - - 7.0  Total Bilirubin 0.3 - 1.2 mg/dL - - 1.1  Alkaline Phos 38 - 126 U/L - - 98  AST 15 - 41 U/L - - 14(L)  ALT 0 - 44 U/L - - 17    Lab Results  Component Value Date   WBC 14.4 (H) 03/09/2021   HGB 8.3 (L) 03/09/2021   HCT 26.8 (L) 03/09/2021   MCV 90.2 03/09/2021   PLT <5 (LL) 03/09/2021   NEUTROABS 14.2 (H) 03/08/2021    MR THORACIC SPINE W WO CONTRAST  Result Date: 03/06/2021 CLINICAL DATA:  70 year old female with myeloproliferative disorder, hematologic malignancy. Severe thrombocytopenia. Persistent back pain and leg weakness. EXAM: MRI THORACIC WITHOUT AND WITH CONTRAST TECHNIQUE: Multiplanar and multiecho pulse sequences of the thoracic spine were obtained without and with intravenous contrast. CONTRAST:  86mL GADAVIST GADOBUTROL 1 MMOL/ML IV SOLN COMPARISON:  Chest CT 10/29/2020 FINDINGS: Limited cervical spine imaging: Preserved cervical lordosis. Disc and endplate degeneration appears age-appropriate. Diffusely decreased marrow signal in the cervical spine, see below. No destructive cervical spine lesion is evident. Thoracic spine segmentation:  Normal on the comparison. Alignment:  Stable thoracic kyphosis.  No spondylolisthesis.  Vertebrae: Compression fracture of the T7 vertebral body with about 15% loss of vertebral body height. Compression fracture of the T8 vertebral body with greater up to 30% loss of vertebral body height. No retropulsion of bone at either level. Patchy marrow edema and enhancement at both levels, slightly more diffuse at T7. And background abnormally decreased T1 marrow signal throughout the visible spine and ribs. But no other levels of marrow edema or abnormal marrow enhancement. Cord: Normal. Capacious thoracic spinal canal at most levels. No abnormal intradural enhancement. No dural thickening. Paraspinal and other soft tissues: There is confluent paraspinal soft tissue edema at T7 and T8, maximal at the former on the left (series 21, image 24) where the pre contrast T1 signal raises the possibility of extraosseous extension of tumor there. But that is not corroborated on T2 imaging. And either confluent soft tissue edema or small paraspinal hematoma is suspected there. Negative visible thoracic and upper abdominal viscera. Disc levels: Most thoracic disc levels are normal for age. However, there is occasional mild degenerative thoracic facet and ligament flavum hypertrophy - and this includes T7-T8 and T8-T9. At T8-T9 there is a small left lateral recess disc protrusion (series 19, image 31) superimposed on the fracture. And combined with facet hypertrophy there is moderate bilateral T8 neural foraminal stenosis. But no spinal stenosis. At T11-T12 there is a small left paracentral disc protrusion. No associated stenosis. IMPRESSION: 1. Acute  or subacute pathologic compression fractures of T7 and T8. Greater loss of height at the latter (30%) and up to moderate bilateral T8 neural foraminal stenosis, although primarily degenerative in nature with no significant retropulsion of bone. No thoracic spinal stenosis. 2. Diffusely abnormal marrow signal in the visible spine and ribs. This could reflect a diffuse  malignant marrow replacement, versus a generalized red marrow reactivation in the setting of anemia/thrombocytopenia 3. There is no extraosseous extension of any abnormal soft tissue from the affected vertebrae, although that possibility was raised at T7 but felt instead to be paraspinal edema or hematoma related to #1. Electronically Signed   By: Genevie Ann M.D.   On: 03/06/2021 12:04   DG CHEST PORT 1 VIEW  Result Date: 03/07/2021 CLINICAL DATA:  Back pain EXAM: PORTABLE CHEST 1 VIEW COMPARISON:  01/04/2021 FINDINGS: Right PICC line is in place with the tip in the SVC. Low lung volumes with bibasilar atelectasis. Heart is normal size. No effusions or acute bony abnormality. IMPRESSION: Right PICC line tip in the SVC. Low lung volumes, bibasilar atelectasis. Electronically Signed   By: Rolm Baptise M.D.   On: 03/07/2021 22:06    ASSESSMENT AND PLAN: 1.  [name] with severe thrombocytopenia 2.  Anemia 3.  Back pain secondary to compression fracture 4.  Mild confusion-?  Etiology 5.  Type 2 diabetes mellitus 6.  Diabetic polyneuropathy 7.  Hypertension  -Labs reviewed.  She has known persistent thrombocytopenia due to her [name] and recent chemotherapy.  She receives Nplate 10 mcg/kg weekly which she received on 9/26.  She has been receiving platelet transfusions with minimal response.  She is receiving an HLA matched unit today.  I have requested a 1 hour posttransfusion CBC.   -She is awaiting transfer to [facility]. -The patient hemoglobin is overall stable.  Recommend close monitoring.  Transfuse for hemoglobin less than 7.5. -Would recommend orthopedic consultation for evaluation of back pain.  The patient may benefit from a back brace.  IR was consulted for consideration of kyphoplasty.  They would like her platelet count to be higher before considering this procedure.  We will await further input from them. -Management of chronic medical conditions per hospitalist   Future Appointments  Date  Time Provider [place]  03/11/2021  1:15 PM [facility] [place] FLUSH [facility] None  03/11/2021  2:15 PM [facility] INFUSION [facility] None  03/15/2021  2:30 PM [facility] [place] FLUSH [facility] None  03/15/2021  3:30 PM [facility] INFUSION [facility] None  03/19/2021  9:15 AM [facility] NURSE [facility]      LOS: 1 day   Mikey Bussing, DNP, AGPCNP-BC, AOCNP 03/09/21

## 2021-03-09 NOTE — Plan of Care (Signed)
New admission am shift today, alert and orientedx4, room air, ambulatory, reports intermittent pain to lower back, npo after md for IR 9/28, double lumen picc right arm, continent of bowel and bladder, no concerns noted at this time, call bell with in reach, bed in lowest and locked position.   Problem: Education: Goal: Knowledge of General Education information will improve Description: Including pain rating scale, medication(s)/side effects and non-pharmacologic comfort measures Outcome: Progressing   Problem: Clinical Measurements: Goal: Ability to maintain clinical measurements within normal limits will improve Outcome: Progressing Goal: Will remain free from infection Outcome: Progressing   Problem: Activity: Goal: Risk for activity intolerance will decrease Outcome: Progressing   Problem: Nutrition: Goal: Adequate nutrition will be maintained Outcome: Progressing   Problem: Pain Managment: Goal: General experience of comfort will improve Outcome: Progressing   Problem: Safety: Goal: Ability to remain free from injury will improve Outcome: Progressing

## 2021-03-10 DIAGNOSIS — E1142 Type 2 diabetes mellitus with diabetic polyneuropathy: Secondary | ICD-10-CM | POA: Diagnosis not present

## 2021-03-10 DIAGNOSIS — M549 Dorsalgia, unspecified: Secondary | ICD-10-CM

## 2021-03-10 DIAGNOSIS — D72829 Elevated white blood cell count, unspecified: Secondary | ICD-10-CM

## 2021-03-10 DIAGNOSIS — C9310 Chronic myelomonocytic leukemia not having achieved remission: Secondary | ICD-10-CM | POA: Diagnosis not present

## 2021-03-10 LAB — CBC
HCT: 28.2 % — ABNORMAL LOW (ref 36.0–46.0)
HCT: 27 % — ABNORMAL LOW (ref 36.0–46.0)
Hemoglobin: 8.8 g/dL — ABNORMAL LOW (ref 12.0–15.0)
Hemoglobin: 8.4 g/dL — ABNORMAL LOW (ref 12.0–15.0)
MCH: 27.9 pg (ref 26.0–34.0)
MCH: 27.7 pg (ref 26.0–34.0)
MCHC: 31.2 g/dL (ref 30.0–36.0)
MCHC: 31.1 g/dL (ref 30.0–36.0)
MCV: 89.5 fL (ref 80.0–100.0)
MCV: 89.1 fL (ref 80.0–100.0)
Platelets: 8 10*3/uL — CL (ref 150–400)
Platelets: 9 10*3/uL — CL (ref 150–400)
RBC: 3.15 MIL/uL — ABNORMAL LOW (ref 3.87–5.11)
RBC: 3.03 MIL/uL — ABNORMAL LOW (ref 3.87–5.11)
RDW: 18.2 % — ABNORMAL HIGH (ref 11.5–15.5)
RDW: 18.2 % — ABNORMAL HIGH (ref 11.5–15.5)
WBC: 28.1 10*3/uL — ABNORMAL HIGH (ref 4.0–10.5)
WBC: 24.9 10*3/uL — ABNORMAL HIGH (ref 4.0–10.5)
nRBC: 0 % (ref 0.0–0.2)
nRBC: 0 % (ref 0.0–0.2)

## 2021-03-10 LAB — GLUCOSE, CAPILLARY
Glucose-Capillary: 285 mg/dL — ABNORMAL HIGH (ref 70–99)
Glucose-Capillary: 276 mg/dL — ABNORMAL HIGH (ref 70–99)
Glucose-Capillary: 254 mg/dL — ABNORMAL HIGH (ref 70–99)
Glucose-Capillary: 249 mg/dL — ABNORMAL HIGH (ref 70–99)
Glucose-Capillary: 284 mg/dL — ABNORMAL HIGH (ref 70–99)

## 2021-03-10 LAB — BPAM PLATELET PHERESIS
Blood Product Expiration Date: 202209272359
ISSUE DATE / TIME: 202209271227
Unit Type and Rh: 6200

## 2021-03-10 LAB — PREPARE PLATELET PHERESIS: Unit division: 0

## 2021-03-10 MED ORDER — INSULIN ASPART 100 UNIT/ML IJ SOLN
0.0000 [IU] | Freq: Three times a day (TID) | INTRAMUSCULAR | Status: DC
  Administered 2021-03-10 – 2021-03-11 (×2): 5 [IU] via SUBCUTANEOUS
  Administered 2021-03-11: 11 [IU] via SUBCUTANEOUS
  Administered 2021-03-12: 8 [IU] via SUBCUTANEOUS
  Administered 2021-03-12: 12 [IU] via SUBCUTANEOUS

## 2021-03-10 MED ORDER — CYCLOBENZAPRINE HCL 5 MG PO TABS: 5.0000 mg | ORAL_TABLET | Freq: Three times a day (TID) | ORAL | Status: DC | PRN

## 2021-03-10 MED ORDER — DEXAMETHASONE 4 MG PO TABS
4.0000 mg | ORAL_TABLET | Freq: Every day | ORAL | Status: DC
  Administered 2021-03-10 – 2021-03-11 (×2): 4 mg via ORAL
  Filled 2021-03-10 (×2): qty 1

## 2021-03-10 MED ORDER — ACETAMINOPHEN 325 MG PO TABS
650.0000 mg | ORAL_TABLET | Freq: Once | ORAL | Status: AC
  Administered 2021-03-10: 650 mg via ORAL
  Filled 2021-03-10: qty 2

## 2021-03-10 MED ORDER — ACETAMINOPHEN 500 MG PO TABS
1000.0000 mg | ORAL_TABLET | Freq: Three times a day (TID) | ORAL | Status: DC
  Administered 2021-03-10 – 2021-03-12 (×7): 1000 mg via ORAL
  Filled 2021-03-10 (×7): qty 2

## 2021-03-10 MED ORDER — POLYETHYLENE GLYCOL 3350 17 G PO PACK
17.0000 g | PACK | Freq: Every day | ORAL | Status: DC
  Administered 2021-03-10 – 2021-03-11 (×2): 17 g via ORAL
  Filled 2021-03-10 (×2): qty 1

## 2021-03-10 MED ORDER — INSULIN GLARGINE-YFGN 100 UNIT/ML ~~LOC~~ SOLN
18.0000 [IU] | Freq: Every day | SUBCUTANEOUS | Status: DC
  Administered 2021-03-11: 18 [IU] via SUBCUTANEOUS
  Filled 2021-03-10: qty 0.18

## 2021-03-10 MED ORDER — OXYCODONE HCL 5 MG PO TABS
10.0000 mg | ORAL_TABLET | Freq: Four times a day (QID) | ORAL | Status: DC | PRN
  Administered 2021-03-10 – 2021-03-12 (×7): 10 mg via ORAL
  Filled 2021-03-10 (×7): qty 2

## 2021-03-10 MED ORDER — DIPHENHYDRAMINE HCL 50 MG/ML IJ SOLN
25.0000 mg | Freq: Once | INTRAMUSCULAR | Status: AC
  Administered 2021-03-10: 25 mg via INTRAVENOUS
  Filled 2021-03-10: qty 1

## 2021-03-10 MED ORDER — SODIUM CHLORIDE 0.9% IV SOLUTION: Freq: Once | INTRAVENOUS | Status: AC

## 2021-03-10 MED ORDER — OXYCODONE-ACETAMINOPHEN 5-325 MG PO TABS: 1.0000 | ORAL_TABLET | Freq: Four times a day (QID) | ORAL | Status: DC | PRN

## 2021-03-10 MED ORDER — PANTOPRAZOLE SODIUM 40 MG PO TBEC
40.0000 mg | DELAYED_RELEASE_TABLET | Freq: Every day | ORAL | Status: DC
  Administered 2021-03-10 – 2021-03-12 (×3): 40 mg via ORAL
  Filled 2021-03-10 (×3): qty 1

## 2021-03-10 NOTE — Progress Notes (Signed)
PROGRESS NOTE        PATIENT DETAILS Name: Kathleen Gibson Age: 70 y.o. Sex: female Date of Birth: 03-01-1951 Admit Date: 03/07/2021 Admitting Physician Ava Swayze, DO MAU:[id number], Renee A, DO  Brief Narrative: Patient is a 70 y.o. female with history of [name] with thrombocytopenia-presented with severe back pain-imaging studies revealed compression fractures of T7/T8-she was found to have severe thrombocytopenia with a platelet count of 6000 on admission.  She was subsequently admitted to the hospitalist service.  Subjective: Back pain stable.  No obvious bleeding.  Objective: Vitals: Blood pressure 103/60, pulse 99, temperature 98 F (36.7 C), temperature source Oral, resp. rate 16, height 5\' 3"  (1.6 m), weight 69.2 kg, SpO2 96 %.   Exam: Gen Exam:Alert awake-not in any distress HEENT:atraumatic, normocephalic Chest: B/L clear to auscultation anteriorly CVS:S1S2 regular Abdomen:soft non tender, non distended Extremities:no edema Neurology: Non focal Skin: no rash  Pertinent Labs/Radiology:   Assessment/Plan: Severe back pain: Due to T7-T8 compression fracture-concern that this may be pathologic given underlying [name].  She seems relatively comfortable on IV Dilaudid today-discussed with oncology-we will add steroids/muscle relaxants-and attempt to transition to oral narcotics today.  Ambulate with TSLO brace.  Dr. Burr Medico will reach out to IR to see if kyphoplasty can be performed.  [name] with severe thrombocytopenia: Has had severe thrombocytopenia for the past several months-no evidence of bleeding-no response to several units of platelets and Nplate yet.  Awaiting transfer to [facility].  Leukocytosis: Probably due to [name].  HTN: BP stable-continue metoprolol  DM-2 (A1c 9.4 on 9/26): CBGs remain on the higher side-being started on steroids-increase Lantus to 18 units, add 4 units of NovoLog with meals-change to moderate dose SSI.   Reassess on 9/29.  Recent Labs    03/09/21 1643 03/09/21 2156 03/10/21 0735  GLUCAP 217* 257* 285*    History of iron deficiency anemia: Continue ferrous sulfate  Diabetic polyneuropathy: Stable-continue Neurontin  Procedures: None Consults: None DVT Prophylaxis: SCD's Code Status:DNR Family Communication: None at bedside  Time spent: 35 minutes-Greater than 50% of this time was spent in counseling, explanation of diagnosis, planning of further management, and coordination of care.  Diet: Diet Order             Diet heart healthy/carb modified Room service appropriate? Yes; Fluid consistency: Thin  Diet effective now           Diet Carb Modified                      Disposition Plan: Status is: Inpatient  Remains inpatient appropriate because:Inpatient level of care appropriate due to severity of illness  Dispo:  Patient From: Home  Planned Disposition: Home  Medically stable for discharge: No      Barriers to Discharge: Attempted transition to oral narcotics to see if back pain can be controlled with the oral regimen-still awaiting bed at [facility]  Antimicrobial agents: Anti-infectives (From admission, onward)    None        MEDICATIONS: Scheduled Meds:  sodium chloride   Intravenous Once   Chlorhexidine Gluconate Cloth  6 each Topical Daily   gabapentin  600 mg Oral BID   insulin aspart  0-5 Units Subcutaneous QHS   insulin aspart  0-9 Units Subcutaneous TID WC   insulin glargine-yfgn  10 Units Subcutaneous  Daily   metoprolol succinate  25 mg Oral Daily   senna-docusate  2 tablet Oral QHS   Continuous Infusions: PRN Meds:.cyclobenzaprine, HYDROmorphone (DILAUDID) injection   I have personally reviewed following labs and imaging studies  LABORATORY DATA: CBC: Recent Labs  Lab 03/04/21 0737 03/07/21 1740 03/08/21 0255 03/08/21 1820 03/09/21 0609 03/09/21 1454  WBC 8.8 15.3* 20.6* 15.7* 14.4* 21.8*   NEUTROABS 5.5 9.4* 14.2*  --   --   --   HGB 8.4* 9.3* 9.7* 8.1* 8.3* 8.5*  HCT 26.5* 29.5* 30.4* 26.3* 26.8* 27.5*  MCV 86.9 87.8 87.9 89.8 90.2 89.6  PLT <5* 6* 5* 6* <5* 6*    Basic Metabolic Panel: Recent Labs  Lab 03/04/21 0737 03/07/21 1740 03/08/21 0255 03/09/21 0609  NA 136 134* 135 135  K 4.1 3.6 3.7 3.8  CL 104 104 104 103  CO2 24 21* 24 27  GLUCOSE 219* 344* 268* 176*  BUN 23 15 14 12   CREATININE 0.86 0.79 0.64 0.63  CALCIUM 9.4 8.8* 8.9 8.3*  MG  --   --  1.7  --   PHOS  --   --  3.6  --     GFR: Estimated Creatinine Clearance: 61.9 mL/min (by C-G formula based on SCr of 0.63 mg/dL).  Liver Function Tests: Recent Labs  Lab 03/04/21 0737 03/07/21 1740  AST 12* 14*  ALT 12 17  ALKPHOS 126 98  BILITOT 0.8 1.1  PROT 7.7 7.0  ALBUMIN 3.5 3.1*   No results for input(s): LIPASE, AMYLASE in the last 168 hours. No results for input(s): AMMONIA in the last 168 hours.  Coagulation Profile: No results for input(s): INR, PROTIME in the last 168 hours.  Cardiac Enzymes: No results for input(s): CKTOTAL, CKMB, CKMBINDEX, TROPONINI in the last 168 hours.  BNP (last 3 results) No results for input(s): PROBNP in the last 8760 hours.  Lipid Profile: No results for input(s): CHOL, HDL, LDLCALC, TRIG, CHOLHDL, LDLDIRECT in the last 72 hours.  Thyroid Function Tests: No results for input(s): TSH, T4TOTAL, FREET4, T3FREE, THYROIDAB in the last 72 hours.  Anemia Panel: No results for input(s): VITAMINB12, FOLATE, FERRITIN, TIBC, IRON, RETICCTPCT in the last 72 hours.  Urine analysis:    Component Value Date/Time   COLORURINE AMBER (A) 03/08/2021 2029   APPEARANCEUR CLEAR 03/08/2021 2029   LABSPEC 1.024 03/08/2021 2029   PHURINE 6.0 03/08/2021 2029   GLUCOSEU 150 (A) 03/08/2021 2029   HGBUR NEGATIVE 03/08/2021 2029   BILIRUBINUR NEGATIVE 03/08/2021 2029   [place] NEGATIVE 03/08/2021 2029   PROTEINUR 30 (A) 03/08/2021 2029   NITRITE NEGATIVE 03/08/2021  2029   LEUKOCYTESUR NEGATIVE 03/08/2021 2029    Sepsis Labs: Lactic Acid, Venous    Component Value Date/Time   LATICACIDVEN 1.3 09/05/2019 2058    MICROBIOLOGY: Recent Results (from the past 240 hour(s))  SARS CORONAVIRUS 2 (TAT 6-24 HRS) Nasopharyngeal Nasopharyngeal Swab     Status: None   Collection Time: 03/08/21  9:25 AM   Specimen: Nasopharyngeal Swab  Result Value Ref Range Status   SARS Coronavirus 2 NEGATIVE NEGATIVE Final    Comment: (NOTE) SARS-CoV-2 target nucleic acids are NOT DETECTED.  The SARS-CoV-2 RNA is generally detectable in upper and lower respiratory specimens during the acute phase of infection. Negative results do not preclude SARS-CoV-2 infection, do not rule out co-infections with other pathogens, and should not be used as the sole basis for treatment or other patient management decisions. Negative results must be combined with  clinical observations, patient history, and epidemiological information. The expected result is Negative.  Fact Sheet for Patients: SugarRoll.be  Fact Sheet for Healthcare Providers: https://www.woods-mathews.com/  This test is not yet approved or cleared by the [place] FDA and  has been authorized for detection and/or diagnosis of SARS-CoV-2 by FDA under an Emergency Use Authorization (EUA). This EUA will remain  in effect (meaning this test can be used) for the duration of the COVID-19 declaration under Se ction 564(b)(1) of the Act, 21 U.S.C. section 360bbb-3(b)(1), unless the authorization is terminated or revoked sooner.  Performed at [facility], [place] [address] [place], [place] [address]     RADIOLOGY STUDIES/RESULTS: No results found.   LOS: 2 days   Oren Binet, MD  [facility]    To contact the attending provider between 7A-7P or the covering provider during after hours 7P-7A, please log into the web site [email or web address] and access  using universal [place] password for that web site. If you do not have the password, please call the hospital operator.  03/10/2021, 11:12 AM

## 2021-03-10 NOTE — Plan of Care (Signed)
Pt alert and oriented x4, reports pain to mid lower back, back brace fitted via ortho, platelets 8, x1 infusion of platelets, cbc scheduled for 2030, call bell within reach, no concerns noted.   Problem: Clinical Measurements: Goal: Ability to maintain clinical measurements within normal limits will improve Outcome: Progressing Goal: Will remain free from infection Outcome: Progressing

## 2021-03-10 NOTE — Progress Notes (Signed)
SCOTTY PINDER   DOB:04/06/1951   DT#:[id number]   DXI#:[id number]  Hem/onc follow up note  Subjective: Patient is clinically stable, back pain gets worse when she changes position or walking, she tried to the thoracic brace today, and did feel it's helpful.  She has not been out of bed much due to the back pain, although she does not have much pain when she lays in the bed.  She has not had a bowel movement since admission, started MiraLAX this afternoon. No bleeding.  No confusion or other complaints.   Objective:  Vitals:   03/10/21 1141 03/10/21 1530  BP: 123/74 (!) 114/57  Pulse: (!) 107 94  Resp: 16 (!) 22  Temp: 98.2 F (36.8 C) 97.7 F (36.5 C)  SpO2: 99% 100%    Body mass index is 27.02 kg/m.  Intake/Output Summary (Last 24 hours) at 03/10/2021 1543 Last data filed at 03/10/2021 1218 Gross per 24 hour  Intake 240 ml  Output 1750 ml  Net -1510 ml     Sclerae unicteric  Oropharynx clear  No peripheral adenopathy  Lungs clear -- no rales or rhonchi  Heart regular rate and rhythm  Abdomen benign  MSK no focal spinal tenderness, no peripheral edema  Neuro nonfocal, oriented with clear speech.    CBG (last 3)  Recent Labs    03/09/21 2156 03/10/21 0735 03/10/21 1139  GLUCAP 257* 285* 276*     Labs:  Urine Studies No results for input(s): UHGB, CRYS in the last 72 hours.  Invalid input(s): UACOL, UAPR, USPG, UPH, UTP, UGL, UKET, UBIL, UNIT, UROB, [place], UEPI, UWBC, [place], [place]  Basic Metabolic Panel: Recent Labs  Lab 03/04/21 0737 03/07/21 1740 03/08/21 0255 03/09/21 0609  NA 136 134* 135 135  K 4.1 3.6 3.7 3.8  CL 104 104 104 103  CO2 24 21* 24 27  GLUCOSE 219* 344* 268* 176*  BUN $Re'23 15 14 12  'IaA$ CREATININE 0.86 0.79 0.64 0.63  CALCIUM 9.4 8.8* 8.9 8.3*  MG  --   --  1.7  --   PHOS  --   --  3.6  --    GFR Estimated Creatinine Clearance: 61.9 mL/min (by C-G formula based on SCr of 0.63 mg/dL). Liver Function Tests: Recent Labs  Lab  03/04/21 0737 03/07/21 1740  AST 12* 14*  ALT 12 17  ALKPHOS 126 98  BILITOT 0.8 1.1  PROT 7.7 7.0  ALBUMIN 3.5 3.1*   No results for input(s): LIPASE, AMYLASE in the last 168 hours. No results for input(s): AMMONIA in the last 168 hours. Coagulation profile No results for input(s): INR, PROTIME in the last 168 hours.  CBC: Recent Labs  Lab 03/04/21 0737 03/04/21 0737 03/07/21 1740 03/08/21 0255 03/08/21 1820 03/09/21 0609 03/09/21 1454 03/10/21 1348  WBC 8.8   < > 15.3* 20.6* 15.7* 14.4* 21.8* 28.1*  NEUTROABS 5.5  --  9.4* 14.2*  --   --   --   --   HGB 8.4*   < > 9.3* 9.7* 8.1* 8.3* 8.5* 8.8*  HCT 26.5*  --  29.5* 30.4* 26.3* 26.8* 27.5* 28.2*  MCV 86.9  --  87.8 87.9 89.8 90.2 89.6 89.5  PLT <5*   < > 6* 5* 6* <5* 6* 8*   < > = values in this interval not displayed.   Cardiac Enzymes: No results for input(s): CKTOTAL, CKMB, CKMBINDEX, TROPONINI in the last 168 hours. BNP: Invalid input(s): POCBNP CBG: Recent Labs  Lab 03/09/21 1220 03/09/21 1643 03/09/21 2156 03/10/21 0735 03/10/21 1139  GLUCAP 234* 217* 257* 285* 276*   D-Dimer No results for input(s): DDIMER in the last 72 hours. Hgb A1c Recent Labs    03/08/21 0255  HGBA1C 9.4*   Lipid Profile No results for input(s): CHOL, HDL, LDLCALC, TRIG, CHOLHDL, LDLDIRECT in the last 72 hours. Thyroid function studies No results for input(s): TSH, T4TOTAL, T3FREE, THYROIDAB in the last 72 hours.  Invalid input(s): FREET3 Anemia work up No results for input(s): VITAMINB12, FOLATE, FERRITIN, TIBC, IRON, RETICCTPCT in the last 72 hours. Microbiology Recent Results (from the past 240 hour(s))  SARS CORONAVIRUS 2 (TAT 6-24 HRS) Nasopharyngeal Nasopharyngeal Swab     Status: None   Collection Time: 03/08/21  9:25 AM   Specimen: Nasopharyngeal Swab  Result Value Ref Range Status   SARS Coronavirus 2 NEGATIVE NEGATIVE Final    Comment: (NOTE) SARS-CoV-2 target nucleic acids are NOT DETECTED.  The  SARS-CoV-2 RNA is generally detectable in upper and lower respiratory specimens during the acute phase of infection. Negative results do not preclude SARS-CoV-2 infection, do not rule out co-infections with other pathogens, and should not be used as the sole basis for treatment or other patient management decisions. Negative results must be combined with clinical observations, patient history, and epidemiological information. The expected result is Negative.  Fact Sheet for Patients: SugarRoll.be  Fact Sheet for Healthcare Providers: https://www.woods-mathews.com/  This test is not yet approved or cleared by the [place] FDA and  has been authorized for detection and/or diagnosis of SARS-CoV-2 by FDA under an Emergency Use Authorization (EUA). This EUA will remain  in effect (meaning this test can be used) for the duration of the COVID-19 declaration under Se ction 564(b)(1) of the Act, 21 U.S.C. section 360bbb-3(b)(1), unless the authorization is terminated or revoked sooner.  Performed at [facility], [place] [address], [place], [place] [address]       Studies:  No results found.  Assessment: 70 y.o. 1.  [name] with severe thrombocytopenia 2.  Anemia 3.  Back pain secondary to compression fracture of T7/8  4.  Mild confusion secondary to narcotics, resolved now  5.  Type 2 diabetes mellitus 6.  Diabetic polyneuropathy 7.  Hypertension 8. Constipation    Recommendation  -lab reviewed, plt 8 today, will give one unit plt today, blood bank does not have HLA matched platelet -Repeated CBC daily, okay to hold platelet transfusion unless its less than 5 tomorrow  -I have discussed with Dr. Sloan Leiter about switching her to oral pain medication, she is also on low-dose dexamethasone and muscle relaxant as needed.  We will watch her blood glucose closely with steroids -I encouraged her to use the thoracic brace when she is out of  bed -will discuss with IR about KP procedure to see if they can do when the pain is as above 10-20K with plt transfusion around the procedure.  Due to her [name] and transfusion refractory thrombocytopenia, her platelet counts is very unlikely going up above 30K in near future  -If her pain is controlled, she probably can be discharged home.  Not sure if she still needs transfer to [facility].  She lives with her niece, whom I talked to her at the bedside today. -will f/u as needed     Truitt Merle, MD 03/10/2021  3:43 PM

## 2021-03-10 NOTE — Progress Notes (Signed)
Inpatient Diabetes Program Recommendations  AACE/ADA: New Consensus Statement on Inpatient Glycemic Control (2015)  Target Ranges:  Prepandial:   less than 140 mg/dL      Peak postprandial:   less than 180 mg/dL (1-2 hours)      Critically ill patients:  140 - 180 mg/dL   Lab Results  Component Value Date   GLUCAP 276 (H) 03/10/2021   HGBA1C 9.4 (H) 03/08/2021    Review of Glycemic Control  Diabetes history: DM 2 Outpatient Diabetes medications: Janumet 50-1000 mg bid Current orders for Inpatient glycemic control:  Seglee 18 units Novolog 0-15 units tid + hs  Decadron 4 mg Daily  Inpatient Diabetes Program Recommendations:    Spoke with pt regarding A1c and glucose control at home. Pt reports with her chemo therapy she gets steroids for 5 consecutive days once a month, her last dose was Friday she also has received prednisone in the ED requiring insulin coverage for hyperglycemia. Spoke with pt about the need for additional medication for hyperglycemia on days she is taking steroids to control glucose ranges. Pt also reported that she stopped checking her glucose when it became so high. Encouraged glucose checks to see if additional medication is needed at certain times. Pt also reported that she was not paying attention to what she ate due to depression.  3 months ago pt said her A1c was around 6.8%. Discussed current A1c level. Discussed glucose and A1c goals. Discussed importance of glucose control.  Thanks,  Tama Headings RN, MSN, BC-ADM Inpatient Diabetes Coordinator Team Pager [phone] (8a-5p)

## 2021-03-10 NOTE — Progress Notes (Signed)
Orthopedic Tech Progress Note Patient Details:  Kathleen Gibson 04/29/1951 [id number]  Applied TLSO BRACE to patient   Patient ID: Kathleen Gibson, female   DOB: [birth year], 70 y.o.   MRN: [id number]  Janit Pagan 03/10/2021, 1:32 PM

## 2021-03-11 ENCOUNTER — Other Ambulatory Visit: Payer: Medicare HMO

## 2021-03-11 DIAGNOSIS — D696 Thrombocytopenia, unspecified: Secondary | ICD-10-CM | POA: Diagnosis not present

## 2021-03-11 DIAGNOSIS — E1142 Type 2 diabetes mellitus with diabetic polyneuropathy: Secondary | ICD-10-CM | POA: Diagnosis not present

## 2021-03-11 DIAGNOSIS — M549 Dorsalgia, unspecified: Secondary | ICD-10-CM | POA: Diagnosis not present

## 2021-03-11 LAB — GLUCOSE, CAPILLARY
Glucose-Capillary: 341 mg/dL — ABNORMAL HIGH (ref 70–99)
Glucose-Capillary: 247 mg/dL — ABNORMAL HIGH (ref 70–99)
Glucose-Capillary: 77 mg/dL (ref 70–99)
Glucose-Capillary: 265 mg/dL — ABNORMAL HIGH (ref 70–99)

## 2021-03-11 LAB — CBC
HCT: 28.8 % — ABNORMAL LOW (ref 36.0–46.0)
Hemoglobin: 9 g/dL — ABNORMAL LOW (ref 12.0–15.0)
MCH: 27.9 pg (ref 26.0–34.0)
MCHC: 31.3 g/dL (ref 30.0–36.0)
MCV: 89.2 fL (ref 80.0–100.0)
Platelets: 11 10*3/uL — CL (ref 150–400)
RBC: 3.23 MIL/uL — ABNORMAL LOW (ref 3.87–5.11)
RDW: 18.2 % — ABNORMAL HIGH (ref 11.5–15.5)
WBC: 26.1 10*3/uL — ABNORMAL HIGH (ref 4.0–10.5)
nRBC: 0 % (ref 0.0–0.2)

## 2021-03-11 MED ORDER — INSULIN GLARGINE-YFGN 100 UNIT/ML ~~LOC~~ SOLN
22.0000 [IU] | Freq: Every day | SUBCUTANEOUS | Status: DC
  Administered 2021-03-12: 22 [IU] via SUBCUTANEOUS
  Filled 2021-03-11: qty 0.22

## 2021-03-11 MED ORDER — INSULIN ASPART 100 UNIT/ML IJ SOLN
4.0000 [IU] | Freq: Three times a day (TID) | INTRAMUSCULAR | Status: DC
  Administered 2021-03-11 – 2021-03-12 (×3): 4 [IU] via SUBCUTANEOUS

## 2021-03-11 MED ORDER — BISACODYL 10 MG RE SUPP: 10.0000 mg | Freq: Every day | RECTAL | Status: DC | PRN

## 2021-03-11 MED ORDER — LACTULOSE 10 GM/15ML PO SOLN
30.0000 g | Freq: Four times a day (QID) | ORAL | Status: AC
Start: 2021-03-11 — End: 2021-03-11
  Administered 2021-03-11 (×2): 30 g via ORAL
  Filled 2021-03-11 (×2): qty 45

## 2021-03-11 MED ORDER — POLYETHYLENE GLYCOL 3350 17 G PO PACK
17.0000 g | PACK | Freq: Two times a day (BID) | ORAL | Status: DC
  Administered 2021-03-11 – 2021-03-12 (×2): 17 g via ORAL
  Filled 2021-03-11 (×2): qty 1

## 2021-03-11 MED ORDER — NALOXEGOL OXALATE 12.5 MG PO TABS
12.5000 mg | ORAL_TABLET | Freq: Every day | ORAL | Status: DC
  Administered 2021-03-12: 12.5 mg via ORAL
  Filled 2021-03-11: qty 1

## 2021-03-11 MED ORDER — NALOXEGOL OXALATE 12.5 MG PO TABS
12.5000 mg | ORAL_TABLET | Freq: Every day | ORAL | Status: DC
  Filled 2021-03-11: qty 1

## 2021-03-11 NOTE — TOC Transition Note (Addendum)
Transition of Care [facility]) - CM/SW Discharge Note   Patient Details  Name: Kathleen Gibson MRN: [id number] Date of Birth: 1950-12-07  Transition of [facility]) CM/SW Contact:  Verdell Carmine, RN Phone Number: 03/11/2021, 11:06 AM   Clinical Narrative:     Patient is alert and oriented spoke to her via phone. She agrees with DME being ordered  and shipped to home. Verified address is correct on file. Approved HH and wants a agency that works well with her insurance. Cited financial contraints as as a motivator for  DME and HH. [facility] to accept, they will check at office  and call this CM back.  [facility] will accept for [facility] they will call patient to set up.        Patient Goals and CMS Choice        Discharge Placement             Home with DME and [facility]          Discharge Plan and Services                DME Arranged: Hospital bed, Walker rolling with seat   Date DME Agency Contacted: 03/11/21 Time DME Agency Contacted: [id number] Representative spoke with at DME Agency: Freda Munro HH Arranged: PT [place]: [place] Date [place]: 03/11/21 Time [place]: [place] ([place]) Interventions     Readmission Risk Interventions No flowsheet data found.

## 2021-03-11 NOTE — Evaluation (Signed)
Physical Therapy Evaluation Patient Details Name: Kathleen Gibson MRN: [id number] DOB: 1950-07-15 Today's Date: 03/11/2021  History of Present Illness  Pt is a 70 y/o female admitted 9/25 secondary to intractable back pain. Noted to have T7-8 compression fx. She was also found to have severe thrombocytopenia. PMH includes HTN, CLL on treatment and DM.  Clinical Impression  Pt admitted secondary to problem above with deficits below. Pt requiring min guard for safety to ambulate and perform stair training. Pt notably fatigued after performing 4 steps, so educated about having bed downstairs to help with energy conservation. Also educated about using rollator for increased safety. Pt reports niece is available to assist if needed at d/c. Will continue to follow acutely.        Recommendations for follow up therapy are one component of a multi-disciplinary discharge planning process, led by the attending physician.  Recommendations may be updated based on patient status, additional functional criteria and insurance authorization.  Follow Up Recommendations Home health PT;Supervision for mobility/OOB    Equipment Recommendations  Hospital bed;3in1 (PT);Other (comment) (rollator (4 wheeled walker with seat))    Recommendations for Other Services       Precautions / Restrictions Precautions Precautions: Back Precaution Booklet Issued: No Precaution Comments: Reviewed back precautions with pt. Required Braces or Orthoses: Spinal Brace Spinal Brace: Thoracolumbosacral orthotic Restrictions Weight Bearing Restrictions: No      Mobility  Bed Mobility Overal bed mobility: Needs Assistance Bed Mobility: Sidelying to Sit;Sit to Sidelying   Sidelying to sit: Supervision     Sit to sidelying: Supervision General bed mobility comments: Supervision for safety. Cues for log roll technique for home.    Transfers Overall transfer level: Needs assistance Equipment used: None Transfers: Sit  to/from Stand Sit to Stand: Min guard         General transfer comment: Min guard for safety. Increased time required secondary to pain. Used hands to walk up legs  Ambulation/Gait Ambulation/Gait assistance: Min guard Gait Distance (Feet): 200 Feet Assistive device: None Gait Pattern/deviations: Step-through pattern;Decreased stride length Gait velocity: Decreased   General Gait Details: Mildly unsteady gait. Guarded throughout. Min guard for safety. Educated about using rollator for increased safety.  Stairs Stairs: Yes Stairs assistance: Min guard Stair Management: One rail Right;Sideways;Step to pattern Number of Stairs: 4 General stair comments: Used sideways technique for increased safety. Min guard for safety. Educated about having assist for stair navigation at home. Pt notably fatigued following stair navigation  Wheelchair Mobility    Modified Rankin (Stroke Patients Only)       Balance Overall balance assessment: Mild deficits observed, not formally tested                                           Pertinent Vitals/Pain Pain Assessment: 0-10 Pain Score: 4  Pain Location: back Pain Descriptors / Indicators: Aching;Operative site guarding Pain Intervention(s): Limited activity within patient's tolerance;Monitored during session    [place] expects to be discharged to:: Private residence Living Arrangements: Other relatives (niece) Available Help at Discharge: Family;Available PRN/intermittently Type of Home: House Home Access: Stairs to enter Entrance Stairs-Rails: Right Entrance Stairs-Number of Steps: 4 Home Layout: Two level Home Equipment: Grab bars - tub/shower Additional Comments: Reports they plan to move bedroom downstairs    Prior Function Level of Independence: Independent  Hand Dominance        Extremity/Trunk Assessment   Upper Extremity Assessment Upper Extremity Assessment:  Generalized weakness    Lower Extremity Assessment Lower Extremity Assessment: Generalized weakness    Cervical / Trunk Assessment Cervical / Trunk Assessment: Other exceptions Cervical / Trunk Exceptions: T7-8 compression fx  Communication   Communication: No difficulties  Cognition Arousal/Alertness: Awake/alert Behavior During Therapy: WFL for tasks assessed/performed Overall Cognitive Status: Within Functional Limits for tasks assessed                                        General Comments General comments (skin integrity, edema, etc.): Discussed having bed downstairs to avoid having to navigate flight of steps initially.    Exercises     Assessment/Plan    PT Assessment Patient needs continued PT services  PT Problem List Decreased strength;Decreased balance;Decreased mobility;Decreased activity tolerance;Decreased knowledge of use of DME;Decreased knowledge of precautions;Pain       PT Treatment Interventions DME instruction;Gait training;Stair training;Functional mobility training;Therapeutic activities;Therapeutic exercise;Balance training;Patient/family education    PT Goals (Current goals can be found in the Care Plan section)  Acute Rehab PT Goals Patient Stated Goal: to go home PT Goal Formulation: With patient Time For Goal Achievement: 03/25/21 Potential to Achieve Goals: Good    Frequency Min 3X/week   Barriers to discharge        Co-evaluation               AM-PAC PT "6 Clicks" Mobility  Outcome Measure Help needed turning from your back to your side while in a flat bed without using bedrails?: A Little Help needed moving from lying on your back to sitting on the side of a flat bed without using bedrails?: A Little Help needed moving to and from a bed to a chair (including a wheelchair)?: A Little Help needed standing up from a chair using your arms (e.g., wheelchair or bedside chair)?: A Little Help needed to walk in  hospital room?: A Little Help needed climbing 3-5 steps with a railing? : A Little 6 Click Score: 18    End of Session Equipment Utilized During Treatment: Gait belt;Back brace Activity Tolerance: Patient tolerated treatment well Patient left: in bed;with call bell/phone within reach Nurse Communication: Mobility status PT Visit Diagnosis: Other abnormalities of gait and mobility (R26.89);Unsteadiness on feet (R26.81)    Time: 1740-8144 PT Time Calculation (min) (ACUTE ONLY): 23 min   Charges:   PT Evaluation $PT Eval Low Complexity: 1 Low PT Treatments $Gait Training: 8-22 mins        Lou Miner, DPT  Acute Rehabilitation Services  Pager: [phone] Office: [phone]   Rudean Hitt 03/11/2021, 1:07 PM

## 2021-03-11 NOTE — Care Management (Signed)
    Durable Medical Equipment  (From admission, onward)           Start     Ordered   03/11/21 1143  For home use only DME Hospital bed  Once       Question Answer Comment  Length of Need Lifetime   Patient has (list medical condition): thoracic compression   The above medical condition requires: Patient requires the ability to reposition frequently   Head must be elevated greater than: 30 degrees   Bed type Semi-electric   Support Surface: Gel Overlay      03/11/21 1142   03/11/21 1017  For home use only DME 4 wheeled rolling walker with seat  Once       Question:  Patient needs a walker to treat with the following condition  Answer:  Weakness   03/11/21 1017   03/11/21 1011  For home use only DME 3 n 1  Once        03/11/21 1017

## 2021-03-11 NOTE — Progress Notes (Signed)
Pt seen and evaluated. Pt very guarded and with mild unsteadiness during gait. Required min guard A for safety. Also practiced steps to get into house and required min guard A for safety. Educated about having assist with stair navigation at home and use of rollator for increased safety. Pt also reporting she has a flight of steps in her home, so would benefit from hospital bed so she can stay on the 1st floor. Formal note to follow.   Recommendations: HHPT, rollator (4 wheeled walker with seat), and hospital bed.   Reuel Derby, PT, DPT  Acute Rehabilitation Services  Pager: [phone] Office: [phone]

## 2021-03-11 NOTE — Progress Notes (Signed)
Inpatient Diabetes Program Recommendations  AACE/ADA: New Consensus Statement on Inpatient Glycemic Control (2015)  Target Ranges:  Prepandial:   less than 140 mg/dL      Peak postprandial:   less than 180 mg/dL (1-2 hours)      Critically ill patients:  140 - 180 mg/dL   Lab Results  Component Value Date   GLUCAP 341 (H) 03/11/2021   HGBA1C 9.4 (H) 03/08/2021    Review of Glycemic Control Results for KIMELA, MALSTROM (MRN [id number]) as of 03/11/2021 10:12  Ref. Range 03/10/2021 07:35 03/10/2021 11:39 03/10/2021 16:06 03/10/2021 17:18 03/10/2021 21:27 03/11/2021 07:30  Glucose-Capillary Latest Ref Range: 70 - 99 mg/dL 285 (H) 276 (H) 254 (H) 249 (H) 284 (H) 341 (H)   Diabetes history: DM 2 Outpatient Diabetes medications: Janumet 50-1000 mg bid Current orders for Inpatient glycemic control:  Seglee 18 units Novolog 0-15 units tid + hs  Decadron 4 mg Daily  Inpatient Diabetes Program Recommendations:    -  Consider increasing Semglee to 30 units. Give additional units this am. -  Add Novolog 4 units tid meal coverage  Spoke with pt on 9/28  Thanks,  Tama Headings RN, MSN, BC-ADM Inpatient Diabetes Coordinator Team Pager [phone] (8a-5p)

## 2021-03-11 NOTE — Care Management Important Message (Signed)
Important Message  Patient Details  Name: Kathleen Gibson MRN: [id number] Date of Birth: 1951/05/28   Medicare Important Message Given:  Yes     Orbie Pyo 03/11/2021, 9:13 AM

## 2021-03-11 NOTE — Care Management (Deleted)
    Durable Medical Equipment  (From admission, onward)           Start     Ordered   03/11/21 1017  For home use only DME 4 wheeled rolling walker with seat  Once       Question:  Patient needs a walker to treat with the following condition  Answer:  Weakness   03/11/21 1017   03/11/21 1011  For home use only DME Hospital bed  Once       Question Answer Comment  Length of Need Lifetime   Patient has (list medical condition): MS   The above medical condition requires: Patient requires the ability to reposition frequently   Head must be elevated greater than: 30 degrees   Bed type Semi-electric   Support Surface: Gel Overlay      03/11/21 1017   03/11/21 1011  For home use only DME 3 n 1  Once        03/11/21 1017

## 2021-03-11 NOTE — Progress Notes (Signed)
PROGRESS NOTE        PATIENT DETAILS Name: Kathleen Gibson Age: 70 y.o. Sex: female Date of Birth: 11/19/1950 Admit Date: 03/07/2021 Admitting Physician Ava Swayze, DO ZDG:[id number], Renee A, DO  Brief Narrative: Patient is a 70 y.o. female with history of [name] with thrombocytopenia-presented with severe back pain-imaging studies revealed compression fractures of T7/T8-she was found to have severe thrombocytopenia with a platelet count of 6000 on admission.  She was subsequently admitted to the hospitalist service.  Subjective: Appears comfortable-back pain relatively controlled with oral regimen of steroids/oxycodone/muscle relaxants and TSLO brace.  Objective: Vitals: Blood pressure 106/60, pulse 89, temperature 98.2 F (36.8 C), temperature source Oral, resp. rate (!) 21, height 5\' 3"  (1.6 m), weight 69.2 kg, SpO2 99 %.   Exam: Gen Exam:Alert awake-not in any distress HEENT:atraumatic, normocephalic Chest: B/L clear to auscultation anteriorly CVS:S1S2 regular Abdomen:soft non tender, non distended Extremities:no edema Neurology: Non focal Skin: no rash   Pertinent Labs/Radiology: WBC: 26.1 Hb: 9.0 Platelet: 11   Assessment/Plan: Severe back pain: Due to T7-T8 compression fracture-concern that this may be pathologic given underlying [name].  Initially required IV Dilaudid-but has been transitioned to a regimen of scheduled Tylenol/Flexeril/oxycodone/Decadron with adequate pain control.  Plan is to mobilize with T SLO brace-and if she continues to improve-suspect can be discharged home tomorrow.    [name] with severe thrombocytopenia: Has had severe thrombocytopenia for the past several months-no evidence of bleeding-no response to several units of platelets and Nplate yet.  Per oncology-no need to transfuse unless platelet count <5  Leukocytosis: Probably due to [name].  HTN: BP stable-continue metoprolol  DM-2 (A1c 9.4 on 9/26): CBGs continue to be on the  higher side-suspect they will remain on the higher side as patient has been started on steroids-increase Lantus to 22 units, add [place] of NovoLog with meals-continue SSI-reassess 9/30.   Recent Labs    03/10/21 2127 03/11/21 0730 03/11/21 1148  GLUCAP 284* 341* 247*     History of iron deficiency anemia: Continue ferrous sulfate  Diabetic polyneuropathy: Stable-continue Neurontin  Procedures: None Consults: None DVT Prophylaxis: SCD's Code Status:DNR Family Communication: None at bedside  Time spent: 25 minutes-Greater than 50% of this time was spent in counseling, explanation of diagnosis, planning of further management, and coordination of care.  Diet: Diet Order             Diet heart healthy/carb modified Room service appropriate? Yes; Fluid consistency: Thin  Diet effective now           Diet Carb Modified                      Disposition Plan: Status is: Inpatient  Remains inpatient appropriate because:Inpatient level of care appropriate due to severity of illness  Dispo:  Patient From: Home  Planned Disposition: Home  Medically stable for discharge: No      Barriers to Discharge: Attempted transition to oral narcotics to see if back pain can be controlled with the oral regimen-still awaiting bed at [facility].  Antimicrobial agents: Anti-infectives (From admission, onward)    None        MEDICATIONS: Scheduled Meds:  sodium chloride   Intravenous Once   acetaminophen  1,000 mg Oral Q8H   Chlorhexidine Gluconate Cloth  6 each Topical Daily   dexamethasone  4  mg Oral Q2000   gabapentin  600 mg Oral BID   insulin aspart  0-15 Units Subcutaneous TID WC   insulin aspart  0-5 Units Subcutaneous QHS   insulin glargine-yfgn  18 Units Subcutaneous Daily   metoprolol succinate  25 mg Oral Daily   pantoprazole  40 mg Oral Q1200   polyethylene glycol  17 g Oral Daily   senna-docusate  2 tablet Oral QHS   Continuous  Infusions: PRN Meds:.cyclobenzaprine, oxyCODONE   I have personally reviewed following labs and imaging studies  LABORATORY DATA: CBC: Recent Labs  Lab 03/07/21 1740 03/08/21 0255 03/08/21 1820 03/09/21 0609 03/09/21 1454 03/10/21 1348 03/10/21 2030 03/11/21 0250  WBC 15.3* 20.6*   < > 14.4* 21.8* 28.1* 24.9* 26.1*  NEUTROABS 9.4* 14.2*  --   --   --   --   --   --   HGB 9.3* 9.7*   < > 8.3* 8.5* 8.8* 8.4* 9.0*  HCT 29.5* 30.4*   < > 26.8* 27.5* 28.2* 27.0* 28.8*  MCV 87.8 87.9   < > 90.2 89.6 89.5 89.1 89.2  PLT 6* 5*   < > <5* 6* 8* 9* 11*   < > = values in this interval not displayed.     Basic Metabolic Panel: Recent Labs  Lab 03/07/21 1740 03/08/21 0255 03/09/21 0609  NA 134* 135 135  K 3.6 3.7 3.8  CL 104 104 103  CO2 21* 24 27  GLUCOSE 344* 268* 176*  BUN 15 14 12   CREATININE 0.79 0.64 0.63  CALCIUM 8.8* 8.9 8.3*  MG  --  1.7  --   PHOS  --  3.6  --      GFR: Estimated Creatinine Clearance: 61.9 mL/min (by C-G formula based on SCr of 0.63 mg/dL).  Liver Function Tests: Recent Labs  Lab 03/07/21 1740  AST 14*  ALT 17  ALKPHOS 98  BILITOT 1.1  PROT 7.0  ALBUMIN 3.1*    No results for input(s): LIPASE, AMYLASE in the last 168 hours. No results for input(s): AMMONIA in the last 168 hours.  Coagulation Profile: No results for input(s): INR, PROTIME in the last 168 hours.  Cardiac Enzymes: No results for input(s): CKTOTAL, CKMB, CKMBINDEX, TROPONINI in the last 168 hours.  BNP (last 3 results) No results for input(s): PROBNP in the last 8760 hours.  Lipid Profile: No results for input(s): CHOL, HDL, LDLCALC, TRIG, CHOLHDL, LDLDIRECT in the last 72 hours.  Thyroid Function Tests: No results for input(s): TSH, T4TOTAL, FREET4, T3FREE, THYROIDAB in the last 72 hours.  Anemia Panel: No results for input(s): VITAMINB12, FOLATE, FERRITIN, TIBC, IRON, RETICCTPCT in the last 72 hours.  Urine analysis:    Component Value Date/Time    COLORURINE AMBER (A) 03/08/2021 2029   APPEARANCEUR CLEAR 03/08/2021 2029   LABSPEC 1.024 03/08/2021 2029   PHURINE 6.0 03/08/2021 2029   GLUCOSEU 150 (A) 03/08/2021 2029   HGBUR NEGATIVE 03/08/2021 2029   BILIRUBINUR NEGATIVE 03/08/2021 2029   [place] NEGATIVE 03/08/2021 2029   PROTEINUR 30 (A) 03/08/2021 2029   NITRITE NEGATIVE 03/08/2021 2029   LEUKOCYTESUR NEGATIVE 03/08/2021 2029    Sepsis Labs: Lactic Acid, Venous    Component Value Date/Time   LATICACIDVEN 1.3 09/05/2019 2058    MICROBIOLOGY: Recent Results (from the past 240 hour(s))  SARS CORONAVIRUS 2 (TAT 6-24 HRS) Nasopharyngeal Nasopharyngeal Swab     Status: None   Collection Time: 03/08/21  9:25 AM   Specimen: Nasopharyngeal Swab  Result  Value Ref Range Status   SARS Coronavirus 2 NEGATIVE NEGATIVE Final    Comment: (NOTE) SARS-CoV-2 target nucleic acids are NOT DETECTED.  The SARS-CoV-2 RNA is generally detectable in upper and lower respiratory specimens during the acute phase of infection. Negative results do not preclude SARS-CoV-2 infection, do not rule out co-infections with other pathogens, and should not be used as the sole basis for treatment or other patient management decisions. Negative results must be combined with clinical observations, patient history, and epidemiological information. The expected result is Negative.  Fact Sheet for Patients: SugarRoll.be  Fact Sheet for Healthcare Providers: https://www.woods-mathews.com/  This test is not yet approved or cleared by the [place] FDA and  has been authorized for detection and/or diagnosis of SARS-CoV-2 by FDA under an Emergency Use Authorization (EUA). This EUA will remain  in effect (meaning this test can be used) for the duration of the COVID-19 declaration under Se ction 564(b)(1) of the Act, 21 U.S.C. section 360bbb-3(b)(1), unless the authorization is terminated or revoked  sooner.  Performed at [facility], [name] [address] [place], [place] [address]     RADIOLOGY STUDIES/RESULTS: No results found.   LOS: 3 days   Oren Binet, MD  [facility]    To contact the attending provider between 7A-7P or the covering provider during after hours 7P-7A, please log into the web site [email or web address] and access using universal [place] password for that web site. If you do not have the password, please call the hospital operator.  03/11/2021, 11:55 AM

## 2021-03-11 NOTE — Plan of Care (Signed)

## 2021-03-12 ENCOUNTER — Other Ambulatory Visit (HOSPITAL_COMMUNITY): Payer: Self-pay

## 2021-03-12 ENCOUNTER — Encounter: Payer: Self-pay | Admitting: Hematology

## 2021-03-12 DIAGNOSIS — C9310 Chronic myelomonocytic leukemia not having achieved remission: Secondary | ICD-10-CM | POA: Diagnosis not present

## 2021-03-12 DIAGNOSIS — M549 Dorsalgia, unspecified: Secondary | ICD-10-CM | POA: Diagnosis not present

## 2021-03-12 DIAGNOSIS — E1142 Type 2 diabetes mellitus with diabetic polyneuropathy: Secondary | ICD-10-CM | POA: Diagnosis not present

## 2021-03-12 DIAGNOSIS — G893 Neoplasm related pain (acute) (chronic): Secondary | ICD-10-CM | POA: Diagnosis not present

## 2021-03-12 LAB — CBC
HCT: 30 % — ABNORMAL LOW (ref 36.0–46.0)
Hemoglobin: 9.1 g/dL — ABNORMAL LOW (ref 12.0–15.0)
MCH: 27.7 pg (ref 26.0–34.0)
MCHC: 30.3 g/dL (ref 30.0–36.0)
MCV: 91.2 fL (ref 80.0–100.0)
Platelets: 20 10*3/uL — CL (ref 150–400)
RBC: 3.29 MIL/uL — ABNORMAL LOW (ref 3.87–5.11)
RDW: 18.6 % — ABNORMAL HIGH (ref 11.5–15.5)
WBC: 31.1 10*3/uL — ABNORMAL HIGH (ref 4.0–10.5)
nRBC: 0 % (ref 0.0–0.2)

## 2021-03-12 LAB — GLUCOSE, CAPILLARY
Glucose-Capillary: 296 mg/dL — ABNORMAL HIGH (ref 70–99)
Glucose-Capillary: 283 mg/dL — ABNORMAL HIGH (ref 70–99)

## 2021-03-12 MED ORDER — BISACODYL 10 MG RE SUPP
10.0000 mg | Freq: Every day | RECTAL | 0 refills | Status: DC | PRN
  Filled 2021-03-12: qty 12, 12d supply, fill #0

## 2021-03-12 MED ORDER — ACETAMINOPHEN 500 MG PO TABS: 1000.0000 mg | ORAL_TABLET | Freq: Three times a day (TID) | ORAL | 0 refills | Status: DC

## 2021-03-12 MED ORDER — SITAGLIPTIN PHOS-METFORMIN HCL 50-1000 MG PO TABS
1.0000 | ORAL_TABLET | Freq: Every day | ORAL | 1 refills | Status: DC
  Filled 2021-03-12: qty 90, 90d supply, fill #0

## 2021-03-12 MED ORDER — SODIUM CHLORIDE 0.9% FLUSH: 10.0000 mL | INTRAVENOUS | Status: DC | PRN

## 2021-03-12 MED ORDER — HEPARIN SOD (PORK) LOCK FLUSH 100 UNIT/ML IV SOLN
250.0000 [IU] | INTRAVENOUS | Status: AC | PRN
Start: 2021-03-12 — End: 2021-03-12
  Administered 2021-03-12: 250 [IU]
  Filled 2021-03-12: qty 2.5

## 2021-03-12 MED ORDER — SENNOSIDES-DOCUSATE SODIUM 8.6-50 MG PO TABS
2.0000 | ORAL_TABLET | Freq: Every day | ORAL | 2 refills | Status: DC
  Filled 2021-03-12: qty 60, 30d supply, fill #0

## 2021-03-12 MED ORDER — POLYETHYLENE GLYCOL 3350 17 GM/SCOOP PO POWD
17.0000 g | Freq: Two times a day (BID) | ORAL | 2 refills | Status: DC
  Filled 2021-03-12: qty 510, 15d supply, fill #0

## 2021-03-12 MED ORDER — OXYCODONE HCL 10 MG PO TABS
10.0000 mg | ORAL_TABLET | Freq: Four times a day (QID) | ORAL | 0 refills | Status: DC | PRN
  Filled 2021-03-12: qty 30, 8d supply, fill #0

## 2021-03-12 MED ORDER — CYCLOBENZAPRINE HCL 5 MG PO TABS
5.0000 mg | ORAL_TABLET | Freq: Three times a day (TID) | ORAL | 0 refills | Status: DC | PRN
  Filled 2021-03-12: qty 30, 10d supply, fill #0

## 2021-03-12 MED ORDER — METOPROLOL SUCCINATE ER 25 MG PO TB24
25.0000 mg | ORAL_TABLET | Freq: Every day | ORAL | 0 refills | Status: DC
  Filled 2021-03-12: qty 30, 30d supply, fill #0

## 2021-03-12 MED ORDER — NALOXONE HCL 4 MG/0.1ML NA LIQD
1.0000 | Freq: Once | NASAL | 0 refills | Status: DC | PRN
  Filled 2021-03-12: qty 2, 30d supply, fill #0

## 2021-03-12 NOTE — Progress Notes (Signed)
1300: Discharge paperwork reviewed with patient and patient's daughter, they deny questions or concerns at this time.  PICC team to come to hep lock patient's line.

## 2021-03-12 NOTE — Discharge Summary (Signed)
PATIENT DETAILS Name: Kathleen Gibson Age: 70 y.o. Sex: female Date of Birth: 12-15-1950 MRN: [id number]. Admitting Physician: Karie Kirks, DO URK:[id number], Renee A, DO  Admit Date: 03/07/2021 Discharge date: 03/12/2021  Recommendations for Outpatient Follow-up:  Follow up with PCP in 1-2 weeks Please follow-up with your primary oncologist on Monday.  Admitted From:  Home  Disposition: Home with home health services   Ponca: Yes  Equipment/Devices: None  Discharge Condition: Stable  CODE STATUS: FULL CODE  Diet recommendation:  Diet Order             Diet - low sodium heart healthy           Diet heart healthy/carb modified Room service appropriate? Yes; Fluid consistency: Thin  Diet effective now           Diet Carb Modified                    Brief Summary: Patient is a 70 y.o. female with history of [name] with thrombocytopenia-presented with severe back pain-imaging studies revealed compression fractures of T7/T8-she was found to have severe thrombocytopenia with a platelet count of 6000 on admission.  She was subsequently admitted to the hospitalist service.  Brief Hospital Course: Severe back pain: Due to T7-T8 compression fracture-concern that this may be pathologic given underlying [name].  Initially required IV Dilaudid-but has been transitioned to a regimen of scheduled Tylenol/Flexeril/oxycodone with adequate pain control.  She was briefly placed on Decadron-but has since been discontinued after discussion with oncology.  She has mobilized well with PT with a T SLO brace.  Home health services have been arranged-oncology will continue to follow platelet count-and when appropriate-we will refer patient to IR for possible kyphoplasty.    [name] with severe thrombocytopenia: Has had severe thrombocytopenia for the past several months-no evidence of bleeding-required several units of platelet transfusion and Nplate.  Per oncology-in the outpatient setting-she  gets transfused platelets twice a week.  Thankfully-platelet counts have started to respond-by the day of discharge-up to 20,000 (has been hovering less than 10,000 for almost a month).     Leukocytosis: Probably due to [name].   HTN: BP stable-continue metoprolol   DM-2 (A1c 9.4 on 9/26): CBGs were elevated-due to steroid use-however patient now no longer on Decadron.  She does not desire to be continued on insulin at the time of discharge-she prefers to follow-up with her primary care practitioner and discuss further whether other agents need to be added to her regimen of metformin and Januvia.  Suspect that CBGs will downtrend once Decadron has been washed out of her system.  Procedures None  Discharge Diagnoses:  Active Problems:   Thrombocytopenia (HCC)   Intractable back pain   Discharge Instructions:  Activity:  As tolerated with Full fall precautions use walker/cane & assistance as needed   Discharge Instructions     Call MD for:  severe uncontrolled pain   Complete by: As directed    Diet - low sodium heart healthy   Complete by: As directed    Diet Carb Modified   Complete by: As directed    Discharge instructions   Complete by: As directed    Follow with Primary MD  Raoul Pitch, Renee A, DO in 1-2 weeks  Follow-up with your oncologist on 10/3-as previously scheduled.  Please get a complete blood count and chemistry panel checked by your Primary MD at your next visit, and again as instructed by your Primary MD.  Get  Medicines reviewed and adjusted: Please take all your medications with you for your next visit with your Primary MD  Laboratory/radiological data: Please request your Primary MD to go over all hospital tests and procedure/radiological results at the follow up, please ask your Primary MD to get all Hospital records sent to his/her office.  In some cases, they will be blood work, cultures and biopsy results pending at the time of your discharge. Please  request that your primary care M.D. follows up on these results.  Also Note the following: If you experience worsening of your admission symptoms, develop shortness of breath, life threatening emergency, suicidal or homicidal thoughts you must seek medical attention immediately by calling 911 or calling your MD immediately  if symptoms less severe.  You must read complete instructions/literature along with all the possible adverse reactions/side effects for all the Medicines you take and that have been prescribed to you. Take any new Medicines after you have completely understood and accpet all the possible adverse reactions/side effects.   Do not drive when taking Pain medications or sleeping medications (Benzodaizepines)  Do not take more than prescribed Pain, Sleep and Anxiety Medications. It is not advisable to combine anxiety,sleep and pain medications without talking with your primary care practitioner  Special Instructions: If you have smoked or chewed Tobacco  in the last 2 yrs please stop smoking, stop any regular Alcohol  and or any Recreational drug use.  Wear Seat belts while driving.  Please note: You were cared for by a hospitalist during your hospital stay. Once you are discharged, your primary care physician will handle any further medical issues. Please note that NO REFILLS for any discharge medications will be authorized once you are discharged, as it is imperative that you return to your primary care physician (or establish a relationship with a primary care physician if you do not have one) for your post hospital discharge needs so that they can reassess your need for medications and monitor your lab values.   Increase activity slowly   Complete by: As directed       Allergies as of 03/12/2021       Reactions   Asa [aspirin] Other (See Comments)   Contraindicated d/t low plts   Nsaids Other (See Comments)   Contraindicated d/t low plts        Medication List      STOP taking these medications    acetaminophen 650 MG CR tablet Commonly known as: TYLENOL Replaced by: acetaminophen 500 MG tablet   acetaminophen-codeine 300-30 MG tablet Commonly known as: TYLENOL #3   ammonium lactate 12 % lotion Commonly known as: LAC-HYDRIN   ferrous sulfate 325 (65 FE) MG tablet   glucose monitoring kit monitoring kit   LORazepam 1 MG tablet Commonly known as: ATIVAN   senna 8.6 MG Tabs tablet Commonly known as: SENOKOT   traMADol 50 MG tablet Commonly known as: ULTRAM   zolpidem 5 MG tablet Commonly known as: AMBIEN       TAKE these medications    acetaminophen 500 MG tablet Commonly known as: TYLENOL Take 2 tablets (1,000 mg total) by mouth every 8 (eight) hours. Replaces: acetaminophen 650 MG CR tablet   bisacodyl 10 MG suppository Commonly known as: DULCOLAX Place 1 suppository (10 mg total) rectally daily as needed for moderate constipation (Please take-if no bowel movement in 1-2 days with MiraLAX/senna).   cyclobenzaprine 5 MG tablet Commonly known as: FLEXERIL Take 1 tablet (5 mg total) by mouth  3 (three) times daily as needed for muscle spasms. What changed: when to take this   gabapentin 300 MG capsule Commonly known as: NEURONTIN Take 2 capsules (600 mg total) by mouth 2 (two) times daily.   metoprolol succinate 25 MG 24 hr tablet Commonly known as: TOPROL-XL Take 1 tablet (25 mg total) by mouth daily. What changed:  medication strength how much to take   naloxone 4 MG/0.1ML Liqd nasal spray kit Commonly known as: NARCAN Place 1 spray into the nose once as needed (overdose).   Oxycodone HCl 10 MG Tabs Take 1 tablet (10 mg total) by mouth every 6 (six) hours as needed for moderate pain.   polyethylene glycol 17 g packet Commonly known as: MIRALAX / GLYCOLAX Take 17 g by mouth 2 (two) times daily.   senna-docusate 8.6-50 MG tablet Commonly known as: Senokot-S Take 2 tablets by mouth at bedtime.    sitaGLIPtin-metformin 50-1000 MG tablet Commonly known as: JANUMET Take 1 tablet by mouth at bedtime.               Durable Medical Equipment  (From admission, onward)           Start     Ordered   03/11/21 1143  For home use only DME Hospital bed  Once       Question Answer Comment  Length of Need Lifetime   Patient has (list medical condition): thoracic compression   The above medical condition requires: Patient requires the ability to reposition frequently   Head must be elevated greater than: 30 degrees   Bed type Semi-electric   Support Surface: Gel Overlay      03/11/21 1142   03/11/21 1017  For home use only DME 4 wheeled rolling walker with seat  Once       Question:  Patient needs a walker to treat with the following condition  Answer:  Weakness   03/11/21 1017   03/11/21 1011  For home use only DME 3 n 1  Once        03/11/21 1017            Follow-up Information     Kuneff, Renee A, DO. Schedule an appointment as soon as possible for a visit.   Specialty: Family Medicine Why: 7-10 for follow up with PCP Contact information: [address] [place] [address] [phone]         [facility], Ramsey Follow up.   Specialty: Home Health Services Why: [facility] health agency Contact information: [place] [address] [place], [facility] Follow up.   Why: medical equipment Contact information: [address] [place] [address] [phone]         [facility] .   Contact information: [address] [place] [address] [phone]         Truitt Merle, MD Follow up on 03/15/2021.   Specialties: Hematology, Oncology Why: Follow as previously scheduled. Contact information: [place] [address] [phone]                Allergies  Allergen Reactions   Diona Fanti [Aspirin] Other (See Comments)     Contraindicated d/t low plts   Nsaids Other (See Comments)    Contraindicated d/t low plts      Consultations: Oncology   Other Procedures/Studies: MR THORACIC SPINE W WO CONTRAST  Result  Date: 03/06/2021 CLINICAL DATA:  70 year old female with myeloproliferative disorder, hematologic malignancy. Severe thrombocytopenia. Persistent back pain and leg weakness. EXAM: MRI THORACIC WITHOUT AND WITH CONTRAST TECHNIQUE: Multiplanar and multiecho pulse sequences of the thoracic spine were obtained without and with intravenous contrast. CONTRAST:  30mL GADAVIST GADOBUTROL 1 MMOL/ML IV SOLN COMPARISON:  Chest CT 10/29/2020 FINDINGS: Limited cervical spine imaging: Preserved cervical lordosis. Disc and endplate degeneration appears age-appropriate. Diffusely decreased marrow signal in the cervical spine, see below. No destructive cervical spine lesion is evident. Thoracic spine segmentation:  Normal on the comparison. Alignment:  Stable thoracic kyphosis.  No spondylolisthesis. Vertebrae: Compression fracture of the T7 vertebral body with about 15% loss of vertebral body height. Compression fracture of the T8 vertebral body with greater up to 30% loss of vertebral body height. No retropulsion of bone at either level. Patchy marrow edema and enhancement at both levels, slightly more diffuse at T7. And background abnormally decreased T1 marrow signal throughout the visible spine and ribs. But no other levels of marrow edema or abnormal marrow enhancement. Cord: Normal. Capacious thoracic spinal canal at most levels. No abnormal intradural enhancement. No dural thickening. Paraspinal and other soft tissues: There is confluent paraspinal soft tissue edema at T7 and T8, maximal at the former on the left (series 21, image 24) where the pre contrast T1 signal raises the possibility of extraosseous extension of tumor there. But that is not corroborated on T2 imaging. And either confluent soft tissue edema or small  paraspinal hematoma is suspected there. Negative visible thoracic and upper abdominal viscera. Disc levels: Most thoracic disc levels are normal for age. However, there is occasional mild degenerative thoracic facet and ligament flavum hypertrophy - and this includes T7-T8 and T8-T9. At T8-T9 there is a small left lateral recess disc protrusion (series 19, image 31) superimposed on the fracture. And combined with facet hypertrophy there is moderate bilateral T8 neural foraminal stenosis. But no spinal stenosis. At T11-T12 there is a small left paracentral disc protrusion. No associated stenosis. IMPRESSION: 1. Acute or subacute pathologic compression fractures of T7 and T8. Greater loss of height at the latter (30%) and up to moderate bilateral T8 neural foraminal stenosis, although primarily degenerative in nature with no significant retropulsion of bone. No thoracic spinal stenosis. 2. Diffusely abnormal marrow signal in the visible spine and ribs. This could reflect a diffuse malignant marrow replacement, versus a generalized red marrow reactivation in the setting of anemia/thrombocytopenia 3. There is no extraosseous extension of any abnormal soft tissue from the affected vertebrae, although that possibility was raised at T7 but felt instead to be paraspinal edema or hematoma related to #1. Electronically Signed   By: Genevie Ann M.D.   On: 03/06/2021 12:04   DG CHEST PORT 1 VIEW  Result Date: 03/07/2021 CLINICAL DATA:  Back pain EXAM: PORTABLE CHEST 1 VIEW COMPARISON:  01/04/2021 FINDINGS: Right PICC line is in place with the tip in the SVC. Low lung volumes with bibasilar atelectasis. Heart is normal size. No effusions or acute bony abnormality. IMPRESSION: Right PICC line tip in the SVC. Low lung volumes, bibasilar atelectasis. Electronically Signed   By: Rolm Baptise M.D.   On: 03/07/2021 22:06     TODAY-DAY OF DISCHARGE:  Subjective:   Kathleen Gibson today has no headache,no chest abdominal pain,no new  weakness tingling or numbness, feels much better wants to go home today.  Objective:   Blood pressure 114/71, pulse 94, temperature 98 F (36.7 C), temperature source Oral,  resp. rate 19, height $RemoveBe'5\' 3"'cIHqUellE$  (1.6 m), weight 69.2 kg, SpO2 96 %.  Intake/Output Summary (Last 24 hours) at 03/12/2021 1110 Last data filed at 03/12/2021 0732 Gross per 24 hour  Intake 600 ml  Output --  Net 600 ml   Filed Weights   03/07/21 1635 03/07/21 1647 03/09/21 1418  Weight: 69.9 kg 69.9 kg 69.2 kg    Exam: Awake Alert, Oriented *3, No new F.N deficits, Normal affect [place].AT,PERRAL Supple Neck,No JVD, No cervical lymphadenopathy appriciated.  Symmetrical Chest wall movement, Good air movement bilaterally, CTAB RRR,No Gallops,Rubs or new Murmurs, No Parasternal Heave +ve B.Sounds, Abd Soft, Non tender, No organomegaly appriciated, No rebound -guarding or rigidity. No Cyanosis, Clubbing or edema, No new Rash or bruise   PERTINENT RADIOLOGIC STUDIES: No results found.   PERTINENT LAB RESULTS: CBC: Recent Labs    03/11/21 0250 03/12/21 0340  WBC 26.1* 31.1*  HGB 9.0* 9.1*  HCT 28.8* 30.0*  PLT 11* 20*   CMET CMP     Component Value Date/Time   NA 135 03/09/2021 0609   NA 137 02/18/2021 0000   K 3.8 03/09/2021 0609   CL 103 03/09/2021 0609   CO2 27 03/09/2021 0609   GLUCOSE 176 (H) 03/09/2021 0609   BUN 12 03/09/2021 0609   BUN 13 02/18/2021 0000   CREATININE 0.63 03/09/2021 0609   CREATININE 0.86 03/04/2021 0737   CALCIUM 8.3 (L) 03/09/2021 0609   PROT 7.0 03/07/2021 1740   ALBUMIN 3.1 (L) 03/07/2021 1740   AST 14 (L) 03/07/2021 1740   AST 12 (L) 03/04/2021 0737   ALT 17 03/07/2021 1740   ALT 12 03/04/2021 0737   ALKPHOS 98 03/07/2021 1740   BILITOT 1.1 03/07/2021 1740   BILITOT 0.8 03/04/2021 0737   GFRNONAA >60 03/09/2021 0609   GFRNONAA >60 03/04/2021 0737   GFRAA 45 (L) 09/07/2019 0350   GFRAA >60 11/20/2017 0924    GFR Estimated Creatinine Clearance: 61.9 mL/min (by C-G  formula based on SCr of 0.63 mg/dL). No results for input(s): LIPASE, AMYLASE in the last 72 hours. No results for input(s): CKTOTAL, CKMB, CKMBINDEX, TROPONINI in the last 72 hours. Invalid input(s): POCBNP No results for input(s): DDIMER in the last 72 hours. No results for input(s): HGBA1C in the last 72 hours. No results for input(s): CHOL, HDL, LDLCALC, TRIG, CHOLHDL, LDLDIRECT in the last 72 hours. No results for input(s): TSH, T4TOTAL, T3FREE, THYROIDAB in the last 72 hours.  Invalid input(s): FREET3 No results for input(s): VITAMINB12, FOLATE, FERRITIN, TIBC, IRON, RETICCTPCT in the last 72 hours. Coags: No results for input(s): INR in the last 72 hours.  Invalid input(s): PT Microbiology: Recent Results (from the past 240 hour(s))  SARS CORONAVIRUS 2 (TAT 6-24 HRS) Nasopharyngeal Nasopharyngeal Swab     Status: None   Collection Time: 03/08/21  9:25 AM   Specimen: Nasopharyngeal Swab  Result Value Ref Range Status   SARS Coronavirus 2 NEGATIVE NEGATIVE Final    Comment: (NOTE) SARS-CoV-2 target nucleic acids are NOT DETECTED.  The SARS-CoV-2 RNA is generally detectable in upper and lower respiratory specimens during the acute phase of infection. Negative results do not preclude SARS-CoV-2 infection, do not rule out co-infections with other pathogens, and should not be used as the sole basis for treatment or other patient management decisions. Negative results must be combined with clinical observations, patient history, and epidemiological information. The expected result is Negative.  Fact Sheet for Patients: SugarRoll.be  Fact Sheet for Healthcare Providers: https://www.woods-mathews.com/  This test is not yet approved or cleared by the [place] and  has been authorized for detection and/or diagnosis of SARS-CoV-2 by FDA under an Emergency Use Authorization (EUA). This EUA will remain  in effect (meaning this test  can be used) for the duration of the COVID-19 declaration under Se ction 564(b)(1) of the Act, 21 U.S.C. section 360bbb-3(b)(1), unless the authorization is terminated or revoked sooner.  Performed at [facility], [place] [address] [place], [place] [address]     FURTHER DISCHARGE INSTRUCTIONS:  Get Medicines reviewed and adjusted: Please take all your medications with you for your next visit with your Primary MD  Laboratory/radiological data: Please request your Primary MD to go over all hospital tests and procedure/radiological results at the follow up, please ask your Primary MD to get all Hospital records sent to his/her office.  In some cases, they will be blood work, cultures and biopsy results pending at the time of your discharge. Please request that your primary care M.D. goes through all the records of your hospital data and follows up on these results.  Also Note the following: If you experience worsening of your admission symptoms, develop shortness of breath, life threatening emergency, suicidal or homicidal thoughts you must seek medical attention immediately by calling 911 or calling your MD immediately  if symptoms less severe.  You must read complete instructions/literature along with all the possible adverse reactions/side effects for all the Medicines you take and that have been prescribed to you. Take any new Medicines after you have completely understood and accpet all the possible adverse reactions/side effects.   Do not drive when taking Pain medications or sleeping medications (Benzodaizepines)  Do not take more than prescribed Pain, Sleep and Anxiety Medications. It is not advisable to combine anxiety,sleep and pain medications without talking with your primary care practitioner  Special Instructions: If you have smoked or chewed Tobacco  in the last 2 yrs please stop smoking, stop any regular Alcohol  and or any Recreational drug use.  Wear Seat belts  while driving.  Please note: You were cared for by a hospitalist during your hospital stay. Once you are discharged, your primary care physician will handle any further medical issues. Please note that NO REFILLS for any discharge medications will be authorized once you are discharged, as it is imperative that you return to your primary care physician (or establish a relationship with a primary care physician if you do not have one) for your post hospital discharge needs so that they can reassess your need for medications and monitor your lab values.  Total Time spent coordinating discharge including counseling, education and face to face time equals 35 minutes.  Signed: Savreen Gebhardt 03/12/2021 11:10 AM

## 2021-03-13 DIAGNOSIS — E1169 Type 2 diabetes mellitus with other specified complication: Secondary | ICD-10-CM | POA: Diagnosis not present

## 2021-03-13 DIAGNOSIS — M8448XD Pathological fracture, other site, subsequent encounter for fracture with routine healing: Secondary | ICD-10-CM | POA: Diagnosis not present

## 2021-03-13 DIAGNOSIS — E1142 Type 2 diabetes mellitus with diabetic polyneuropathy: Secondary | ICD-10-CM | POA: Diagnosis not present

## 2021-03-13 DIAGNOSIS — E1165 Type 2 diabetes mellitus with hyperglycemia: Secondary | ICD-10-CM | POA: Diagnosis not present

## 2021-03-13 DIAGNOSIS — D696 Thrombocytopenia, unspecified: Secondary | ICD-10-CM | POA: Diagnosis not present

## 2021-03-13 DIAGNOSIS — S22068A Other fracture of T7-T8 thoracic vertebra, initial encounter for closed fracture: Secondary | ICD-10-CM | POA: Diagnosis not present

## 2021-03-13 DIAGNOSIS — I10 Essential (primary) hypertension: Secondary | ICD-10-CM | POA: Diagnosis not present

## 2021-03-13 DIAGNOSIS — M549 Dorsalgia, unspecified: Secondary | ICD-10-CM | POA: Diagnosis not present

## 2021-03-13 DIAGNOSIS — M2550 Pain in unspecified joint: Secondary | ICD-10-CM | POA: Diagnosis not present

## 2021-03-13 DIAGNOSIS — E782 Mixed hyperlipidemia: Secondary | ICD-10-CM | POA: Diagnosis not present

## 2021-03-13 DIAGNOSIS — C9310 Chronic myelomonocytic leukemia not having achieved remission: Secondary | ICD-10-CM | POA: Diagnosis not present

## 2021-03-13 LAB — PREPARE PLATELET PHERESIS: Unit division: 0

## 2021-03-13 LAB — BPAM PLATELET PHERESIS
Blood Product Expiration Date: 202209302359
ISSUE DATE / TIME: 202209281527
Unit Type and Rh: 5100

## 2021-03-15 ENCOUNTER — Other Ambulatory Visit: Payer: Medicare HMO

## 2021-03-15 ENCOUNTER — Other Ambulatory Visit: Payer: Self-pay

## 2021-03-15 ENCOUNTER — Inpatient Hospital Stay: Payer: Medicare HMO

## 2021-03-15 ENCOUNTER — Inpatient Hospital Stay: Payer: Medicare HMO | Attending: Hematology | Admitting: Hematology

## 2021-03-15 ENCOUNTER — Encounter: Payer: Self-pay | Admitting: Hematology

## 2021-03-15 ENCOUNTER — Ambulatory Visit: Payer: Medicare HMO | Admitting: Hematology

## 2021-03-15 VITALS — BP 124/57 | HR 103 | Temp 98.3°F | Resp 18 | Ht 63.0 in | Wt 154.7 lb

## 2021-03-15 DIAGNOSIS — Z5111 Encounter for antineoplastic chemotherapy: Secondary | ICD-10-CM | POA: Insufficient documentation

## 2021-03-15 DIAGNOSIS — D696 Thrombocytopenia, unspecified: Secondary | ICD-10-CM

## 2021-03-15 DIAGNOSIS — R768 Other specified abnormal immunological findings in serum: Secondary | ICD-10-CM | POA: Diagnosis not present

## 2021-03-15 DIAGNOSIS — C9310 Chronic myelomonocytic leukemia not having achieved remission: Secondary | ICD-10-CM | POA: Insufficient documentation

## 2021-03-15 DIAGNOSIS — Z79899 Other long term (current) drug therapy: Secondary | ICD-10-CM | POA: Diagnosis not present

## 2021-03-15 DIAGNOSIS — I10 Essential (primary) hypertension: Secondary | ICD-10-CM | POA: Insufficient documentation

## 2021-03-15 DIAGNOSIS — R519 Headache, unspecified: Secondary | ICD-10-CM | POA: Insufficient documentation

## 2021-03-15 DIAGNOSIS — Z7984 Long term (current) use of oral hypoglycemic drugs: Secondary | ICD-10-CM | POA: Diagnosis not present

## 2021-03-15 DIAGNOSIS — E1142 Type 2 diabetes mellitus with diabetic polyneuropathy: Secondary | ICD-10-CM | POA: Insufficient documentation

## 2021-03-15 DIAGNOSIS — E1169 Type 2 diabetes mellitus with other specified complication: Secondary | ICD-10-CM | POA: Diagnosis not present

## 2021-03-15 DIAGNOSIS — D693 Immune thrombocytopenic purpura: Secondary | ICD-10-CM | POA: Diagnosis present

## 2021-03-15 DIAGNOSIS — Z95828 Presence of other vascular implants and grafts: Secondary | ICD-10-CM

## 2021-03-15 DIAGNOSIS — K5900 Constipation, unspecified: Secondary | ICD-10-CM | POA: Insufficient documentation

## 2021-03-15 LAB — CMP (CANCER CENTER ONLY)
ALT: 17 U/L (ref 0–44)
AST: 14 U/L — ABNORMAL LOW (ref 15–41)
Albumin: 3.3 g/dL — ABNORMAL LOW (ref 3.5–5.0)
Alkaline Phosphatase: 115 U/L (ref 38–126)
Anion gap: 9 (ref 5–15)
BUN: 14 mg/dL (ref 8–23)
CO2: 24 mmol/L (ref 22–32)
Calcium: 8.5 mg/dL — ABNORMAL LOW (ref 8.9–10.3)
Chloride: 104 mmol/L (ref 98–111)
Creatinine: 0.88 mg/dL (ref 0.44–1.00)
GFR, Estimated: >60 mL/min (ref >60)
Glucose, Bld: 305 mg/dL — ABNORMAL HIGH (ref 70–99)
Potassium: 4.3 mmol/L (ref 3.5–5.1)
Sodium: 137 mmol/L (ref 135–145)
Total Bilirubin: 0.8 mg/dL (ref 0.3–1.2)
Total Protein: 7.1 g/dL (ref 6.5–8.1)

## 2021-03-15 LAB — CBC WITH DIFFERENTIAL (CANCER CENTER ONLY)
Abs Immature Granulocytes: 2.73 10*3/uL — ABNORMAL HIGH (ref 0.00–0.07)
Basophils Absolute: 0.1 10*3/uL (ref 0.0–0.1)
Basophils Relative: 0 %
Eosinophils Absolute: 0 10*3/uL (ref 0.0–0.5)
Eosinophils Relative: 0 %
HCT: 28.8 % — ABNORMAL LOW (ref 36.0–46.0)
Hemoglobin: 8.9 g/dL — ABNORMAL LOW (ref 12.0–15.0)
Immature Granulocytes: 12 %
Lymphocytes Relative: 5 %
Lymphs Abs: 1.2 10*3/uL (ref 0.7–4.0)
MCH: 27.6 pg (ref 26.0–34.0)
MCHC: 30.9 g/dL (ref 30.0–36.0)
MCV: 89.4 fL (ref 80.0–100.0)
Monocytes Absolute: 3.4 10*3/uL — ABNORMAL HIGH (ref 0.1–1.0)
Monocytes Relative: 16 %
Neutro Abs: 14.5 10*3/uL — ABNORMAL HIGH (ref 1.7–7.7)
Neutrophils Relative %: 67 %
Platelet Count: 45 10*3/uL — ABNORMAL LOW (ref 150–400)
RBC: 3.22 MIL/uL — ABNORMAL LOW (ref 3.87–5.11)
RDW: 18.6 % — ABNORMAL HIGH (ref 11.5–15.5)
WBC Count: 22 10*3/uL — ABNORMAL HIGH (ref 4.0–10.5)
nRBC: 0 % (ref 0.0–0.2)

## 2021-03-15 MED ORDER — SODIUM CHLORIDE 0.9% FLUSH
10.0000 mL | INTRAVENOUS | Status: DC | PRN
  Administered 2021-03-15: 10 mL via INTRAVENOUS

## 2021-03-15 NOTE — Progress Notes (Addendum)
[place]   [phone] [phone]   Clinic Follow up Note   Patient Care Team: [name], DO as PCP - General (Family Medicine) Loletha Carrow Kirke Corin, MD as Consulting Physician (Gastroenterology) Truitt Merle, MD as Consulting Physician (Hematology) Alda Berthold, DO as Consulting Physician (Neurology)  Date of Service:  03/15/2021  CHIEF COMPLAINT: f/u of [name], severe thrombocytopenia  CURRENT THERAPY:  Azacitidine days 1-5 q28 days, started 12/28/20 Nplate 21mcg/kg weekly  Platelet transfusion as needed (plt<20K)  ASSESSMENT & PLAN:  Kathleen Gibson is a 70 y.o. female with   1. [name] with severe thrombocytopenia  -She was initially diagnosed with ITP in 2014. She lost follow up after 11/2017 and did not proceed with recommended bone marrow biopsy. -She was referred to ED 10/28/20 by her PCP for low plt count of 4k. She was hospitalized and treated with platelet transfusions, IVIG, oral prednisone $RemoveBeforeD'60mg'[id number]$  and Nplate injections. She tapered off prednisone given little response. -Bone marrow biopsy 10/30/20 felt to likely represent MDS/MPN, particularly [name]. Confirmed diagnosis of [name] on slide review at [facility] by Dr. Linus Orn -She began azacitidine daily days 1-5 q. 28 days on 12/28/20, s/p 2 cycles  -She had worsening thrombocytopenia after stopping Nplate, restarted weekly Nplate 10 mcg/kg on 5/85/2778 -Her thrombocytopenia did improve after first cycle chemo, but it dropped significantly after second cycle -Labs reviewed, plt 45k today, the highest it has been in a long time. -she is scheduled for BM biopsy at [facility] on 03/18/21.    2. Normocytic anemia, moderate  -h/o pernicious anemia/vitamin B12 deficiency -with recent severe thrombocytopenia, she has had loq ferritin and iron study with moderate anemia. She required blood transfusion in hospital. Will give blood transfusion for Hg <=7.5.  -Hg 8.9 today (03/15/21), no need blood  transfusion    3. Left UQ abdominal pain, back pain from T7/T8 compression fracture -abdominal pain likely secondary to splenomegaly. Currently tolerable, continue monitoring. -back pain began in early 01/2021.  -thoracic spine MRI on 03/05/21 showed pathologic compression fractures of T7 and T8, and diffusely abnormal marrow signal. -She was hospitalized on 03/07/21 for severe back pain. She is now wearing a brace and has flexeril and oxycodone. -if her platelets remain above 30K, she can proceed with kyphoplasty. She will talk to Dr. Linus Orn about this as well.  4. Constipation -likely secondary to pain medication -she notes she has miralax, senokot, and dulcolax, but she has not had a bowel movement since 03/12/21. -I recommended she try milk of magnesium.   5. Headache -no other neurological symptoms, will continue monitor clinically, take Tylenol as needed   6. Type 2 diabetes mellitus with polyneuropathy, Hypertension -Continue medication, she will follow-up with her family doctor     PLAN:  -proceed with BM bx at [facility] on 03/18/21 -f/u in 2 weeks -Azacitadine infusion daily 10/17-10/21 -Lab and 1u plt transfusion on Mondays and Thursdays if plt<10K     No problem-specific Assessment & Plan notes found for this encounter.   SUMMARY OF ONCOLOGIC HISTORY: Oncology History  [name] (chronic myelomonocytic leukemia) ([place])  10/29/2020 Imaging   CT CAP  IMPRESSION: 1. Splenomegaly with pathologically enlarged lymph nodes above and below the diaphragm, with overall stable to minimally increased abdominal adenopathy and interval progression of the pelvic adenopathy. 2. Small volume abdominopelvic ascites with diffuse mesenteric stranding. 3. Scattered bilateral pulmonary micro nodules measuring 1-2 mm. 4. Distended gallbladder with some layering hyperdense material representing layering sludge and  tiny stones seen on prior ultrasound. 5. Aortic atherosclerosis.   10/30/2020 Pathology  Results   DIAGNOSIS:   BONE MARROW, ASPIRATE, CLOT, CORE:  -  Hypercellular bone marrow with panhyperplasia, atypia, and no  increase in blasts  -  See comment   PERIPHERAL BLOOD:  -  Marked thrombocytopenia  -  Absolute monocytosis  -  Normocytic anemia  -  See CBC data and comment   COMMENT:  The bone marrow is hypercellular for age (approximately 80%) with myeloid hyperplasia with maturational left shift, erythroid hyperplasia, and increased megakaryocytes.  Mild multilineage atypia is present. Blasts are not increased on aspirate smears (1% by manual differential count) or by CD34 immunohistochemistry on the core biopsy.  Concurrent flow cytometric analysis of the bone marrow aspirate demonstrates increased monocytes, and no increase in blasts or abnormal lymphoid population (see [id number]).  Monocytes are also increased in peripheral  blood, persistent per electronic medical record.  In aggregate, the  findings raise the possibility of a myeloid neoplasm with the  differential diagnosis including a low-grade myelodysplastic syndrome and chronic myelomonocytic leukemia (dysplastic type).    ADDENDUM:  A reticulin special stain performed on the bone marrow core biopsy reveals no significant increase in reticulin fibrosis.  ADDENDUM:  CYTOGENETIC RESULTS:  Karyotype: 46,XX[20]  Interpretation: NORMAL FEMALE KARYOTYPE   FISH RESULTS:  Results: NORMAL   ADDENDUM:  CD123 immunohistochemistry performed on the core biopsy highlights scattered aggregates of positively staining cells consistent with plasmacytoid dendritic cells.    10/30/2020 Pathology Results   DIAGNOSIS:   BONE MARROW; FLOW CYTOMETRIC ANALYSIS:  -  Increased monocytes  -  Scant B-cells present  -  No immunophenotypically aberrant T-cell population identified  -  No increase in blasts  -  See comment   COMMENT:  Monocytes are relatively increased (12% of all cells), without aberrant expression of CD56.   B-cells comprise <1% of total lymphocytes. CD34-positive blasts are not increased (<1% of all cells).  Correlation with concurrent morphology is recommended for complete diagnostic interpretation and overall blast enumeration (see [id number]).    10/30/2020 Pathology Results   FINAL MICROSCOPIC DIAGNOSIS:   A. LYMPH NODE, RIGHT AXILLARY, NEEDLE CORE BIOPSY:  -Lymphoid tissue present  -See comment   COMMENT:  The sections show several small needle core biopsy fragments of lymphoid tissue displaying degenerative cellular changes/necrosis and hence cannot be accurately evaluated.  Sample for flow cytometric analysis not available.  Immunohistochemical stain for CD3 and CD20 were performed with appropriate controls.  There is a mixture of T and B cells in their apparently respective compartments.  There is no definite metastatic malignancy.    11/11/2020 Pathology Results   DIAGNOSIS:   LEFT AXILLARY LYMPH NODE EXCISIONAL BIOPSY; FLOW CYTOMETRIC ANALYSIS:  -  No monotypic B-cell or immunophenotypically aberrant T-cell  population identified  -  See comment   COMMENT:  Flow cytometric analysis identifies B-cells with a normal kappa:lambda ratio of 1.7:1.  A subset of the polytypic B-cells expresses CD10. T-cells show a CD4:CD8 ratio of 3.3:1 without immunophenotypic aberrancy with the markers evaluated.  Although these results do not support the diagnosis of a clonal lymphoid population, sampling issues must always be considered when negative results are obtained, as focal lesions may not be represented in the specimen submitted.  In addition, flow cytometric immunophenotyping will not exclude other pathology if present (e.g. Hodgkin lymphoma, some T-cell lymphomas, metastatic and infectious diseases).   11/11/2020 Pathology Results   FINAL MICROSCOPIC DIAGNOSIS:   A.  LYMPH NODE, LEFT AXILLARY, DISSECTION:  -  Follicular hyperplasia with interfollicular expansion  -  See comment   COMMENT:   Sections of the lymph nodes reveal generally preserved lymph node architecture.  There is follicular hyperplasia with some follicles showing increased hyalinization of germinal centers and mild concentric encircling of germinal centers with small lymphocytes (onion skinning). Interfollicular areas are expanded with increased vascular proliferation, small lymphocytes, plasma cells, histiocytic cells, and patchy neutrophils.  The lymph node capsule is variably thickened by fibrosis with few plasma cells.   A panel of immunohistochemical stains is performed for further  characterization.  CD3 and CD20/PAX5 highlight T-cell and B-cell  compartments, respectively.  Germinal centers are highlighted by CD10 and BCL6 with appropriate absent expression of BCL2.  CD5 is similar to CD3.  CD43 also stains the T-cells.  CD4-positive T-cells exceed CD8-positive T-cells.  CD30 highlights scattered immunoblasts.  CD21 reveals intact follicular dendritic cell meshworks.  CD68 highlights increased histiocytic cells.  HHV 8 is negative.  T. pallidum reveals no definitive organisms.  Pancytokeratin is negative.  TdT stains rare cells.  CD138 highlights plasma cells which are not increased in number and show polytypic light chain expression by kappa/lambda in situ  hybridization.  EBV is negative by in situ hybridization.   Concurrent flow cytometric analysis is negative for a monoclonal B-cell or immunophenotypically aberrant T-cell population (see [id number]).   Together, the findings above are non-specific and can be seen in  reactive and infectious processes as well as Castleman disease.  There is no definitive morphologic or flow cytometric evidence of involvement by a lymphoproliferative disorder from the current workup.   12/17/2020 Initial Diagnosis   [name] (chronic myelomonocytic leukemia) ([place])   12/28/2020 -  Chemotherapy    Patient is on Treatment Plan: MYELODYSPLASIA  AZACITIDINE IV D1-5 Q28D           INTERVAL HISTORY:  Kathleen Gibson is here for a follow up of [name], severe thrombocytopenia. She was last seen by me on 03/10/21 while she was in the hospital. She presents to the clinic alone. She reports she is slowly getting better. She notes she took an oxycodone last night and a flexeril this morning. She notes she was told not to get up without her back brace.   All other systems were reviewed with the patient and are negative.  MEDICAL HISTORY:  Past Medical History:  Diagnosis Date   Diabetes ([place])    HLD (hyperlipidemia)    Hypertension    Pernicious anemia    Pneumonia 09/05/2019   Renal insufficiency 09/06/2019   Vitamin D deficiency     SURGICAL HISTORY: Past Surgical History:  Procedure Laterality Date   ABDOMINAL HERNIA REPAIR  2009   ABDOMINOPLASTY     AXILLARY LYMPH NODE BIOPSY Left 11/11/2020   Procedure: LEFT AXILLARY EXCISIONAL LYMPH NODE BIOPSY;  Surgeon: [name], MD;  Location: [place] OR;  Service: General;  Laterality: Left;   [place]   hemorroid surgery  2007   [place]    I have reviewed the social history and family history with the patient and they are unchanged from previous note.  ALLERGIES:  is allergic to asa [aspirin] and nsaids.  MEDICATIONS:  Current Outpatient Medications  Medication Sig Dispense Refill   acetaminophen (TYLENOL) 500 MG tablet Take 2 tablets (1,000 mg total) by mouth every 8 (eight) hours. 30 tablet 0   bisacodyl (DULCOLAX) 10  MG suppository Place 1 suppository (10 mg total) rectally daily as needed for moderate constipation (Please take-if no bowel movement in 1-2 days with MiraLAX/senna). 12 suppository 0   cyclobenzaprine (FLEXERIL) 5 MG tablet Take 1 tablet (5 mg total) by mouth 3 (three) times daily as needed for muscle spasms. 30 tablet 0   gabapentin (NEURONTIN) 300 MG capsule Take 2 capsules (600 mg total) by mouth 2 (two) times daily. 360 capsule 1    metoprolol succinate (TOPROL-XL) 25 MG 24 hr tablet Take 1 tablet (25 mg total) by mouth daily. 30 tablet 0   naloxone (NARCAN) nasal spray 4 mg/0.1 mL Place 1 spray into the nose once as needed (overdose). 2 each 0   Oxycodone HCl 10 MG TABS Take 1 tablet (10 mg total) by mouth every 6 (six) hours as needed for moderate pain. 30 tablet 0   polyethylene glycol powder (GLYCOLAX/MIRALAX) 17 GM/SCOOP powder Dissolve 1 capful (17 g) in water and drink 2 (two) times daily. 510 g 2   senna-docusate (SENOKOT-S) 8.6-50 MG tablet Take 2 tablets by mouth at bedtime. 60 tablet 2   sitaGLIPtin-metformin (JANUMET) 50-1000 MG tablet Take 1 tablet by mouth at bedtime. 90 tablet 1   No current facility-administered medications for this visit.    PHYSICAL EXAMINATION: ECOG PERFORMANCE STATUS: 2 - Symptomatic, <50% confined to bed  Vitals:   03/15/21 1259  BP: (!) 124/57  Pulse: (!) 103  Resp: 18  Temp: 98.3 F (36.8 C)  SpO2: 98%   Wt Readings from Last 3 Encounters:  03/15/21 154 lb 11.2 oz (70.2 kg)  03/09/21 152 lb 8.9 oz (69.2 kg)  03/01/21 158 lb 12 oz (72 kg)     GENERAL:alert, no distress and comfortable SKIN: skin color normal, no rashes or significant lesions EYES: normal, Conjunctiva are pink and non-injected, sclera clear  NEURO: alert & oriented x 3 with fluent speech  LABORATORY DATA:  I have reviewed the data as listed CBC Latest Ref Rng & Units 03/15/2021 03/12/2021 03/11/2021  WBC 4.0 - 10.5 K/uL 22.0(H) 31.1(H) 26.1(H)  Hemoglobin 12.0 - 15.0 g/dL 8.9(L) 9.1(L) 9.0(L)  Hematocrit 36.0 - 46.0 % 28.8(L) 30.0(L) 28.8(L)  Platelets 150 - 400 K/uL 45(L) 20(LL) 11(LL)     CMP Latest Ref Rng & Units 03/15/2021 03/09/2021 03/08/2021  Glucose 70 - 99 mg/dL 305(H) 176(H) 268(H)  BUN 8 - 23 mg/dL $Remove'14 12 14  'TodEHyE$ Creatinine 0.44 - 1.00 mg/dL 0.88 0.63 0.64  Sodium 135 - 145 mmol/L 137 135 135  Potassium 3.5 - 5.1 mmol/L 4.3 3.8 3.7  Chloride 98 - 111 mmol/L 104 103 104  CO2 22 - 32 mmol/L  $Remov'24 27 24  'aNVmPa$ Calcium 8.9 - 10.3 mg/dL 8.5(L) 8.3(L) 8.9  Total Protein 6.5 - 8.1 g/dL 7.1 - -  Total Bilirubin 0.3 - 1.2 mg/dL 0.8 - -  Alkaline Phos 38 - 126 U/L 115 - -  AST 15 - 41 U/L 14(L) - -  ALT 0 - 44 U/L 17 - -      RADIOGRAPHIC STUDIES: I have personally reviewed the radiological images as listed and agreed with the findings in the report. No results found.    No orders of the defined types were placed in this encounter.  All questions were answered. The patient knows to call the clinic with any problems, questions or concerns. No barriers to learning was detected.       Truitt Merle, MD 03/15/2021   I, Wilburn Mylar, am acting as  scribe for Truitt Merle, MD.   I have reviewed the above documentation for accuracy and completeness, and I agree with the above.

## 2021-03-17 DIAGNOSIS — E1165 Type 2 diabetes mellitus with hyperglycemia: Secondary | ICD-10-CM | POA: Diagnosis not present

## 2021-03-17 DIAGNOSIS — E782 Mixed hyperlipidemia: Secondary | ICD-10-CM | POA: Diagnosis not present

## 2021-03-17 DIAGNOSIS — E1142 Type 2 diabetes mellitus with diabetic polyneuropathy: Secondary | ICD-10-CM | POA: Diagnosis not present

## 2021-03-17 DIAGNOSIS — M2550 Pain in unspecified joint: Secondary | ICD-10-CM | POA: Diagnosis not present

## 2021-03-17 DIAGNOSIS — M8448XD Pathological fracture, other site, subsequent encounter for fracture with routine healing: Secondary | ICD-10-CM | POA: Diagnosis not present

## 2021-03-17 DIAGNOSIS — M549 Dorsalgia, unspecified: Secondary | ICD-10-CM | POA: Diagnosis not present

## 2021-03-17 DIAGNOSIS — E1169 Type 2 diabetes mellitus with other specified complication: Secondary | ICD-10-CM | POA: Diagnosis not present

## 2021-03-17 DIAGNOSIS — D696 Thrombocytopenia, unspecified: Secondary | ICD-10-CM | POA: Diagnosis not present

## 2021-03-17 DIAGNOSIS — C9310 Chronic myelomonocytic leukemia not having achieved remission: Secondary | ICD-10-CM | POA: Diagnosis not present

## 2021-03-17 DIAGNOSIS — I10 Essential (primary) hypertension: Secondary | ICD-10-CM | POA: Diagnosis not present

## 2021-03-18 ENCOUNTER — Ambulatory Visit: Payer: Medicare HMO

## 2021-03-18 DIAGNOSIS — S22000A Wedge compression fracture of unspecified thoracic vertebra, initial encounter for closed fracture: Secondary | ICD-10-CM | POA: Diagnosis not present

## 2021-03-18 DIAGNOSIS — M549 Dorsalgia, unspecified: Secondary | ICD-10-CM | POA: Diagnosis not present

## 2021-03-18 DIAGNOSIS — C9310 Chronic myelomonocytic leukemia not having achieved remission: Secondary | ICD-10-CM | POA: Diagnosis not present

## 2021-03-18 DIAGNOSIS — I10 Essential (primary) hypertension: Secondary | ICD-10-CM | POA: Diagnosis not present

## 2021-03-18 DIAGNOSIS — E119 Type 2 diabetes mellitus without complications: Secondary | ICD-10-CM | POA: Diagnosis not present

## 2021-03-19 ENCOUNTER — Ambulatory Visit (INDEPENDENT_AMBULATORY_CARE_PROVIDER_SITE_OTHER): Payer: Medicare HMO

## 2021-03-19 ENCOUNTER — Other Ambulatory Visit: Payer: Self-pay

## 2021-03-19 ENCOUNTER — Telehealth: Payer: Self-pay

## 2021-03-19 DIAGNOSIS — E538 Deficiency of other specified B group vitamins: Secondary | ICD-10-CM | POA: Diagnosis not present

## 2021-03-19 MED ORDER — CYANOCOBALAMIN 1000 MCG/ML IJ SOLN
1000.0000 ug | Freq: Once | INTRAMUSCULAR | Status: AC
  Administered 2021-03-19: 1000 ug via INTRAMUSCULAR

## 2021-03-19 NOTE — Progress Notes (Signed)
Kathleen Gibson is a 70 y.o. female presents to the office today for monthly b12 injections, per physician's orders. Original order: every 4-week appointments if she wants to continue the B12 injections by nurse visit. Cyanocobalamin, 1053mcg, IM was administered right deltoid today. Patient tolerated injection.     [facility], CMA   Please sign in absence of PCP

## 2021-03-19 NOTE — Telephone Encounter (Signed)
Error

## 2021-03-22 ENCOUNTER — Other Ambulatory Visit: Payer: Self-pay

## 2021-03-22 ENCOUNTER — Inpatient Hospital Stay: Payer: Medicare HMO

## 2021-03-22 VITALS — BP 98/51 | HR 64 | Temp 98.5°F | Resp 17

## 2021-03-22 DIAGNOSIS — D693 Immune thrombocytopenic purpura: Secondary | ICD-10-CM | POA: Diagnosis not present

## 2021-03-22 DIAGNOSIS — Z452 Encounter for adjustment and management of vascular access device: Secondary | ICD-10-CM

## 2021-03-22 DIAGNOSIS — C9310 Chronic myelomonocytic leukemia not having achieved remission: Secondary | ICD-10-CM | POA: Diagnosis not present

## 2021-03-22 DIAGNOSIS — D469 Myelodysplastic syndrome, unspecified: Secondary | ICD-10-CM

## 2021-03-22 DIAGNOSIS — I10 Essential (primary) hypertension: Secondary | ICD-10-CM | POA: Diagnosis not present

## 2021-03-22 DIAGNOSIS — E1142 Type 2 diabetes mellitus with diabetic polyneuropathy: Secondary | ICD-10-CM | POA: Diagnosis not present

## 2021-03-22 DIAGNOSIS — K5900 Constipation, unspecified: Secondary | ICD-10-CM | POA: Diagnosis not present

## 2021-03-22 DIAGNOSIS — Z79899 Other long term (current) drug therapy: Secondary | ICD-10-CM | POA: Diagnosis not present

## 2021-03-22 DIAGNOSIS — R519 Headache, unspecified: Secondary | ICD-10-CM | POA: Diagnosis not present

## 2021-03-22 DIAGNOSIS — D696 Thrombocytopenia, unspecified: Secondary | ICD-10-CM

## 2021-03-22 DIAGNOSIS — Z5111 Encounter for antineoplastic chemotherapy: Secondary | ICD-10-CM | POA: Diagnosis not present

## 2021-03-22 DIAGNOSIS — Z7984 Long term (current) use of oral hypoglycemic drugs: Secondary | ICD-10-CM | POA: Diagnosis not present

## 2021-03-22 LAB — CMP (CANCER CENTER ONLY)
ALT: 14 U/L (ref 0–44)
AST: 15 U/L (ref 15–41)
Albumin: 3.3 g/dL — ABNORMAL LOW (ref 3.5–5.0)
Alkaline Phosphatase: 125 U/L (ref 38–126)
Anion gap: 8 (ref 5–15)
BUN: 13 mg/dL (ref 8–23)
CO2: 24 mmol/L (ref 22–32)
Calcium: 8.6 mg/dL — ABNORMAL LOW (ref 8.9–10.3)
Chloride: 108 mmol/L (ref 98–111)
Creatinine: 0.74 mg/dL (ref 0.44–1.00)
GFR, Estimated: >60 mL/min (ref >60)
Glucose, Bld: 173 mg/dL — ABNORMAL HIGH (ref 70–99)
Potassium: 4.1 mmol/L (ref 3.5–5.1)
Sodium: 140 mmol/L (ref 135–145)
Total Bilirubin: 0.7 mg/dL (ref 0.3–1.2)
Total Protein: 7 g/dL (ref 6.5–8.1)

## 2021-03-22 LAB — CBC WITH DIFFERENTIAL (CANCER CENTER ONLY)
Abs Immature Granulocytes: 0.07 10*3/uL (ref 0.00–0.07)
Basophils Absolute: 0 10*3/uL (ref 0.0–0.1)
Basophils Relative: 0 %
Eosinophils Absolute: 0 10*3/uL (ref 0.0–0.5)
Eosinophils Relative: 0 %
HCT: 27.4 % — ABNORMAL LOW (ref 36.0–46.0)
Hemoglobin: 8.5 g/dL — ABNORMAL LOW (ref 12.0–15.0)
Immature Granulocytes: 1 %
Lymphocytes Relative: 11 %
Lymphs Abs: 0.7 10*3/uL (ref 0.7–4.0)
MCH: 27.6 pg (ref 26.0–34.0)
MCHC: 31 g/dL (ref 30.0–36.0)
MCV: 89 fL (ref 80.0–100.0)
Monocytes Absolute: 1.4 10*3/uL — ABNORMAL HIGH (ref 0.1–1.0)
Monocytes Relative: 22 %
Neutro Abs: 4.2 10*3/uL (ref 1.7–7.7)
Neutrophils Relative %: 66 %
Platelet Count: 5 10*3/uL — CL (ref 150–400)
RBC: 3.08 MIL/uL — ABNORMAL LOW (ref 3.87–5.11)
RDW: 18 % — ABNORMAL HIGH (ref 11.5–15.5)
WBC Count: 6.5 10*3/uL (ref 4.0–10.5)
nRBC: 0 % (ref 0.0–0.2)

## 2021-03-22 MED ORDER — SODIUM CHLORIDE 0.9% FLUSH
10.0000 mL | INTRAVENOUS | Status: AC | PRN
  Administered 2021-03-22: 10 mL

## 2021-03-22 MED ORDER — ROMIPLOSTIM INJECTION 500 MCG
10.0000 ug/kg | Freq: Once | SUBCUTANEOUS | Status: AC
  Administered 2021-03-22: 700 ug via SUBCUTANEOUS
  Filled 2021-03-22: qty 1

## 2021-03-22 MED ORDER — SODIUM CHLORIDE 0.9% IV SOLUTION
250.0000 mL | Freq: Once | INTRAVENOUS | Status: AC
  Administered 2021-03-22: 250 mL via INTRAVENOUS

## 2021-03-22 MED ORDER — HEPARIN SOD (PORK) LOCK FLUSH 100 UNIT/ML IV SOLN
250.0000 [IU] | INTRAVENOUS | Status: AC | PRN
Start: 2021-03-22 — End: 2021-03-22
  Administered 2021-03-22: 250 [IU]

## 2021-03-22 MED ORDER — SODIUM CHLORIDE 0.9% FLUSH
3.0000 mL | INTRAVENOUS | Status: AC | PRN
  Administered 2021-03-22: 3 mL

## 2021-03-22 NOTE — Progress Notes (Signed)
Per MD orders this nurse placed a prepare order for HLA matched platelets for transfusion on Thursday 10/13.  Spoke with Lattie Haw in blood bank and verified all orders has been received. Orders also entered for random platelet transfusion today. No further questions or concerns at this time.

## 2021-03-22 NOTE — Progress Notes (Signed)
CRITICAL VALUE STICKER  CRITICAL VALUE: plt count 5   RECEIVER (on-site recipient of call):  DATE & TIME NOTIFIED: 03/22/21 9:20am MESSENGER (representative from lab):  MD NOTIFIED: Burr Medico   TIME OF NOTIFICATION: 9:21  RESPONSE: joy desk nurse notified in person

## 2021-03-22 NOTE — Patient Instructions (Signed)
Platelet Transfusion A platelet transfusion is a procedure in which you receive donated platelets through an IV. Platelets are tiny pieces of blood cells. When you get an injury, platelets clump together in the area to form a blood clot. This helps stop bleeding and is the beginning of the healing process. If you have too few platelets, your blood may have trouble clotting. This may cause you to bleed and bruise very easily. You may need a platelet transfusion if you have a condition that causes a low number of platelets (thrombocytopenia). A platelet transfusion may be used to stop or prevent excessive bleeding. Tell a health care provider about: Any reactions you have had during previous transfusions. Any allergies you have. All medicines you are taking, including vitamins, herbs, eye drops, creams, and over-the-counter medicines. Any blood disorders you have. Any surgeries you have had. Any medical conditions you have. Whether you are pregnant or may be pregnant. What are the risks? Generally, this is a safe procedure. However, problems may occur, including: Fever. Infection. Allergic reaction to the donor platelets. Your body's disease-fighting system (immune system) attacking the donor platelets (hemolytic reaction). This is rare. A rare reaction that causes lung damage (transfusion-related acute lung injury). What happens before the procedure? Medicines Ask your health care provider about: Changing or stopping your regular medicines. This is especially important if you are taking diabetes medicines or blood thinners. Taking medicines such as aspirin and ibuprofen. These medicines can thin your blood. Do not take these medicines unless your health care provider tells you to take them. Taking over-the-counter medicines, vitamins, herbs, and supplements. General instructions You will have a blood test to determine your blood type. Your blood type determines what kind of platelets you will  be given. Follow instructions from your health care provider about eating or drinking restrictions. If you have had an allergic reaction to a transfusion in the past, you may be given medicine to help prevent a reaction. Your temperature, blood pressure, pulse, and breathing will be monitored. What happens during the procedure?  An IV will be inserted into one of your veins. For your safety, two health care providers will verify your identity along with the donor platelets about to be infused. A bag of donor platelets will be connected to your IV. The platelets will flow into your bloodstream. This usually takes 30-60 minutes. Your temperature, blood pressure, pulse, and breathing will be monitored during the transfusion. This helps detect early signs of any reaction. You will also be monitored for other symptoms that may indicate a reaction, including chills, hives, or itching. If you have signs of a reaction at any time, your transfusion will be stopped, and you may be given medicine to help manage the reaction. When your transfusion is complete, your IV will be removed. Pressure may be applied to the IV site for a few minutes to stop any bleeding. The IV site will be covered with a bandage (dressing). The procedure may vary among health care providers and hospitals. What happens after the procedure? Your blood pressure, temperature, pulse, and breathing will be monitored until you leave the hospital or clinic. You may have some bruising and soreness at your IV site. Follow these instructions at home: Medicines Take over-the-counter and prescription medicines only as told by your health care provider. Talk with your health care provider before you take any medicines that contain aspirin or NSAIDs. These medicines increase your risk for dangerous bleeding. General instructions Change or remove your dressing as   told by your health care provider. Return to your normal activities as told by  your health care provider. Ask your health care provider what activities are safe for you. Do not take baths, swim, or use a hot tub until your health care provider approves. Ask your health care provider if you may take showers. Check your IV site every day for signs of infection. Check for: Redness, swelling, or pain. Fluid or blood. If fluid or blood drains from your IV site, use your hands to press down firmly on a bandage covering the area for a minute or two. Doing this should stop the bleeding. Warmth. Pus or a bad smell. Keep all follow-up visits as told by your health care provider. This is important. Contact a health care provider if you have: A headache that does not go away with medicine. Hives, rash, or itchy skin. Nausea or vomiting. Unusual tiredness or weakness. Signs of infection at your IV site. Get help right away if: You have a fever or chills. You urinate less often than usual. Your urine is darker colored than normal. You have any of the following: Trouble breathing. Pain in your back, abdomen, or chest. Cool, clammy skin. A fast heartbeat. Summary Platelets are tiny pieces of blood cells that clump together to form a blood clot when you have an injury. If you have too few platelets, your blood may have trouble clotting. A platelet transfusion is a procedure in which you receive donated platelets through an IV. A platelet transfusion may be used to stop or prevent excessive bleeding. After the procedure, check your IV site every day for signs of infection, including redness, swelling, pain, or warmth. This information is not intended to replace advice given to you by your health care provider. Make sure you discuss any questions you have with your health care provider. Document Revised: 09/08/2020 Document Reviewed: 07/05/2017 Else[employer] Patient Education  [employer].  Romiplostim injection What is this medication? ROMIPLOSTIM (roe mi PLOE stim) helps  your body make more platelets. This medicine is used to treat low platelets caused by chronic idiopathic thrombocytopenic purpura (ITP) or a bone marrow syndrome caused by radiation sickness. This medicine may be used for other purposes; ask your health care provider or pharmacist if you have questions. COMMON BRAND NAME(S): Nplate What should I tell my care team before I take this medication? They need to know if you have any of these conditions: blood clots myelodysplastic syndrome an unusual or allergic reaction to romiplostim, mannitol, other medicines, foods, dyes, or preservatives pregnant or trying to get pregnant breast-feeding How should I use this medication? This medicine is injected under the skin. It is given by a health care provider in a hospital or clinic setting. A special MedGuide will be given to you before each treatment. Be sure to read this information carefully each time. Talk to your health care provider about the use of this medicine in children. While it may be prescribed for children as young as newborns for selected conditions, precautions do apply. Overdosage: If you think you have taken too much of this medicine contact a poison control center or emergency room at once. NOTE: This medicine is only for you. Do not share this medicine with others. What if I miss a dose? Keep appointments for follow-up doses. It is important not to miss your dose. Call your health care provider if you are unable to keep an appointment. What may interact with this medication? Interactions are not expected.  This list may not describe all possible interactions. Give your health care provider a list of all the medicines, herbs, non-prescription drugs, or dietary supplements you use. Also tell them if you smoke, drink alcohol, or use illegal drugs. Some items may interact with your medicine. What should I watch for while using this medication? Visit your health care provider for regular  checks on your progress. You may need blood work done while you are taking this medicine. Your condition will be monitored carefully while you are receiving this medicine. It is important not to miss any appointments. What side effects may I notice from receiving this medication? Side effects that you should report to your doctor or health care professional as soon as possible: allergic reactions (skin rash, itching or hives; swelling of the face, lips, or tongue) bleeding (bloody or black, tarry stools; red or dark brown urine; spitting up blood or brown material that looks like coffee grounds; red spots on the skin; unusual bruising or bleeding from the eyes, gums, or nose) blood clot (chest pain; shortness of breath; pain, swelling, or warmth in the leg) stroke (changes in vision; confusion; trouble speaking or understanding; severe headaches; sudden numbness or weakness of the face, arm or leg; trouble walking; dizziness; loss of balance or coordination) Side effects that usually do not require medical attention (report to your doctor or health care professional if they continue or are bothersome): diarrhea dizziness headache joint pain muscle pain stomach pain trouble sleeping This list may not describe all possible side effects. Call your doctor for medical advice about side effects. You may report side effects to FDA at 1-800-FDA-1088. Where should I keep my medication? This medicine is given in a hospital or clinic. It will not be stored at home. NOTE: This sheet is a summary. It may not cover all possible information. If you have questions about this medicine, talk to your doctor, pharmacist, or health care provider.  2022 Elsevier/[employer] Standard (2019-07-15 10:28:13)

## 2021-03-23 DIAGNOSIS — E1169 Type 2 diabetes mellitus with other specified complication: Secondary | ICD-10-CM | POA: Diagnosis not present

## 2021-03-23 DIAGNOSIS — M2550 Pain in unspecified joint: Secondary | ICD-10-CM | POA: Diagnosis not present

## 2021-03-23 DIAGNOSIS — M549 Dorsalgia, unspecified: Secondary | ICD-10-CM | POA: Diagnosis not present

## 2021-03-23 DIAGNOSIS — Z7984 Long term (current) use of oral hypoglycemic drugs: Secondary | ICD-10-CM | POA: Diagnosis not present

## 2021-03-23 DIAGNOSIS — C9310 Chronic myelomonocytic leukemia not having achieved remission: Secondary | ICD-10-CM | POA: Diagnosis not present

## 2021-03-23 DIAGNOSIS — D696 Thrombocytopenia, unspecified: Secondary | ICD-10-CM | POA: Diagnosis not present

## 2021-03-23 DIAGNOSIS — E782 Mixed hyperlipidemia: Secondary | ICD-10-CM | POA: Diagnosis not present

## 2021-03-23 DIAGNOSIS — E1165 Type 2 diabetes mellitus with hyperglycemia: Secondary | ICD-10-CM | POA: Diagnosis not present

## 2021-03-23 DIAGNOSIS — Z79899 Other long term (current) drug therapy: Secondary | ICD-10-CM | POA: Diagnosis not present

## 2021-03-23 DIAGNOSIS — M8448XD Pathological fracture, other site, subsequent encounter for fracture with routine healing: Secondary | ICD-10-CM | POA: Diagnosis not present

## 2021-03-23 DIAGNOSIS — E1142 Type 2 diabetes mellitus with diabetic polyneuropathy: Secondary | ICD-10-CM | POA: Diagnosis not present

## 2021-03-23 DIAGNOSIS — K5900 Constipation, unspecified: Secondary | ICD-10-CM | POA: Diagnosis not present

## 2021-03-23 DIAGNOSIS — R519 Headache, unspecified: Secondary | ICD-10-CM | POA: Diagnosis not present

## 2021-03-23 DIAGNOSIS — I10 Essential (primary) hypertension: Secondary | ICD-10-CM | POA: Diagnosis not present

## 2021-03-23 DIAGNOSIS — D693 Immune thrombocytopenic purpura: Secondary | ICD-10-CM | POA: Diagnosis not present

## 2021-03-23 DIAGNOSIS — Z5111 Encounter for antineoplastic chemotherapy: Secondary | ICD-10-CM | POA: Diagnosis not present

## 2021-03-23 LAB — BPAM PLATELET PHERESIS
Blood Product Expiration Date: 202210132359
ISSUE DATE / TIME: 202210101010
Unit Type and Rh: 5100

## 2021-03-23 LAB — PREPARE PLATELET PHERESIS: Unit division: 0

## 2021-03-25 ENCOUNTER — Other Ambulatory Visit: Payer: Self-pay

## 2021-03-25 ENCOUNTER — Inpatient Hospital Stay: Payer: Medicare HMO

## 2021-03-25 ENCOUNTER — Telehealth: Payer: Self-pay

## 2021-03-25 DIAGNOSIS — C9310 Chronic myelomonocytic leukemia not having achieved remission: Secondary | ICD-10-CM | POA: Diagnosis not present

## 2021-03-25 DIAGNOSIS — Z79899 Other long term (current) drug therapy: Secondary | ICD-10-CM | POA: Diagnosis not present

## 2021-03-25 DIAGNOSIS — E1142 Type 2 diabetes mellitus with diabetic polyneuropathy: Secondary | ICD-10-CM | POA: Diagnosis not present

## 2021-03-25 DIAGNOSIS — Z5111 Encounter for antineoplastic chemotherapy: Secondary | ICD-10-CM | POA: Diagnosis not present

## 2021-03-25 DIAGNOSIS — R519 Headache, unspecified: Secondary | ICD-10-CM | POA: Diagnosis not present

## 2021-03-25 DIAGNOSIS — Z7984 Long term (current) use of oral hypoglycemic drugs: Secondary | ICD-10-CM | POA: Diagnosis not present

## 2021-03-25 DIAGNOSIS — M2550 Pain in unspecified joint: Secondary | ICD-10-CM | POA: Diagnosis not present

## 2021-03-25 DIAGNOSIS — D693 Immune thrombocytopenic purpura: Secondary | ICD-10-CM

## 2021-03-25 DIAGNOSIS — D696 Thrombocytopenia, unspecified: Secondary | ICD-10-CM | POA: Diagnosis not present

## 2021-03-25 DIAGNOSIS — Z452 Encounter for adjustment and management of vascular access device: Secondary | ICD-10-CM

## 2021-03-25 DIAGNOSIS — E782 Mixed hyperlipidemia: Secondary | ICD-10-CM | POA: Diagnosis not present

## 2021-03-25 DIAGNOSIS — E1169 Type 2 diabetes mellitus with other specified complication: Secondary | ICD-10-CM | POA: Diagnosis not present

## 2021-03-25 DIAGNOSIS — K5900 Constipation, unspecified: Secondary | ICD-10-CM | POA: Diagnosis not present

## 2021-03-25 DIAGNOSIS — E1165 Type 2 diabetes mellitus with hyperglycemia: Secondary | ICD-10-CM | POA: Diagnosis not present

## 2021-03-25 DIAGNOSIS — I10 Essential (primary) hypertension: Secondary | ICD-10-CM | POA: Diagnosis not present

## 2021-03-25 DIAGNOSIS — M549 Dorsalgia, unspecified: Secondary | ICD-10-CM | POA: Diagnosis not present

## 2021-03-25 DIAGNOSIS — M8448XD Pathological fracture, other site, subsequent encounter for fracture with routine healing: Secondary | ICD-10-CM | POA: Diagnosis not present

## 2021-03-25 LAB — CMP (CANCER CENTER ONLY)
ALT: 14 U/L (ref 0–44)
AST: 18 U/L (ref 15–41)
Albumin: 3.4 g/dL — ABNORMAL LOW (ref 3.5–5.0)
Alkaline Phosphatase: 133 U/L — ABNORMAL HIGH (ref 38–126)
Anion gap: 5 (ref 5–15)
BUN: 16 mg/dL (ref 8–23)
CO2: 27 mmol/L (ref 22–32)
Calcium: 8.6 mg/dL — ABNORMAL LOW (ref 8.9–10.3)
Chloride: 106 mmol/L (ref 98–111)
Creatinine: 0.65 mg/dL (ref 0.44–1.00)
GFR, Estimated: >60 mL/min (ref >60)
Glucose, Bld: 129 mg/dL — ABNORMAL HIGH (ref 70–99)
Potassium: 4.1 mmol/L (ref 3.5–5.1)
Sodium: 138 mmol/L (ref 135–145)
Total Bilirubin: 0.7 mg/dL (ref 0.3–1.2)
Total Protein: 7.1 g/dL (ref 6.5–8.1)

## 2021-03-25 LAB — CBC WITH DIFFERENTIAL (CANCER CENTER ONLY)
Abs Immature Granulocytes: 0.07 10*3/uL (ref 0.00–0.07)
Basophils Absolute: 0 10*3/uL (ref 0.0–0.1)
Basophils Relative: 1 %
Eosinophils Absolute: 0 10*3/uL (ref 0.0–0.5)
Eosinophils Relative: 0 %
HCT: 26.2 % — ABNORMAL LOW (ref 36.0–46.0)
Hemoglobin: 8.2 g/dL — ABNORMAL LOW (ref 12.0–15.0)
Immature Granulocytes: 1 %
Lymphocytes Relative: 11 %
Lymphs Abs: 0.6 10*3/uL — ABNORMAL LOW (ref 0.7–4.0)
MCH: 27.2 pg (ref 26.0–34.0)
MCHC: 31.3 g/dL (ref 30.0–36.0)
MCV: 87 fL (ref 80.0–100.0)
Monocytes Absolute: 1.3 10*3/uL — ABNORMAL HIGH (ref 0.1–1.0)
Monocytes Relative: 22 %
Neutro Abs: 3.9 10*3/uL (ref 1.7–7.7)
Neutrophils Relative %: 65 %
Platelet Count: 6 10*3/uL — CL (ref 150–400)
RBC: 3.01 MIL/uL — ABNORMAL LOW (ref 3.87–5.11)
RDW: 17.7 % — ABNORMAL HIGH (ref 11.5–15.5)
WBC Count: 5.9 10*3/uL (ref 4.0–10.5)
nRBC: 0 % (ref 0.0–0.2)

## 2021-03-25 MED ORDER — HEPARIN SOD (PORK) LOCK FLUSH 100 UNIT/ML IV SOLN
250.0000 [IU] | INTRAVENOUS | Status: AC | PRN
  Administered 2021-03-25: 250 [IU]

## 2021-03-25 MED ORDER — SODIUM CHLORIDE 0.9% FLUSH
10.0000 mL | INTRAVENOUS | Status: AC | PRN
  Administered 2021-03-25: 10 mL

## 2021-03-25 MED ORDER — SODIUM CHLORIDE 0.9% IV SOLUTION
250.0000 mL | Freq: Once | INTRAVENOUS | Status: AC
  Administered 2021-03-25: 250 mL via INTRAVENOUS

## 2021-03-25 NOTE — Patient Instructions (Signed)
Platelet Transfusion A platelet transfusion is a procedure in which you receive donated platelets through an IV. Platelets are tiny pieces of blood cells. When you get an injury, platelets clump together in the area to form a blood clot. This helps stop bleeding and is the beginning of the healing process. If you have too few platelets, your blood may have trouble clotting. This may cause you to bleed and bruise very easily. You may need a platelet transfusion if you have a condition that causes a low number of platelets (thrombocytopenia). A platelet transfusion may be used to stop or prevent excessive bleeding. Tell a health care provider about: Any reactions you have had during previous transfusions. Any allergies you have. All medicines you are taking, including vitamins, herbs, eye drops, creams, and over-the-counter medicines. Any blood disorders you have. Any surgeries you have had. Any medical conditions you have. Whether you are pregnant or may be pregnant. What are the risks? Generally, this is a safe procedure. However, problems may occur, including: Fever. Infection. Allergic reaction to the donor platelets. Your body's disease-fighting system (immune system) attacking the donor platelets (hemolytic reaction). This is rare. A rare reaction that causes lung damage (transfusion-related acute lung injury). What happens before the procedure? Medicines Ask your health care provider about: Changing or stopping your regular medicines. This is especially important if you are taking diabetes medicines or blood thinners. Taking medicines such as aspirin and ibuprofen. These medicines can thin your blood. Do not take these medicines unless your health care provider tells you to take them. Taking over-the-counter medicines, vitamins, herbs, and supplements. General instructions You will have a blood test to determine your blood type. Your blood type determines what kind of platelets you will  be given. Follow instructions from your health care provider about eating or drinking restrictions. If you have had an allergic reaction to a transfusion in the past, you may be given medicine to help prevent a reaction. Your temperature, blood pressure, pulse, and breathing will be monitored. What happens during the procedure?  An IV will be inserted into one of your veins. For your safety, two health care providers will verify your identity along with the donor platelets about to be infused. A bag of donor platelets will be connected to your IV. The platelets will flow into your bloodstream. This usually takes 30-60 minutes. Your temperature, blood pressure, pulse, and breathing will be monitored during the transfusion. This helps detect early signs of any reaction. You will also be monitored for other symptoms that may indicate a reaction, including chills, hives, or itching. If you have signs of a reaction at any time, your transfusion will be stopped, and you may be given medicine to help manage the reaction. When your transfusion is complete, your IV will be removed. Pressure may be applied to the IV site for a few minutes to stop any bleeding. The IV site will be covered with a bandage (dressing). The procedure may vary among health care providers and hospitals. What happens after the procedure? Your blood pressure, temperature, pulse, and breathing will be monitored until you leave the hospital or clinic. You may have some bruising and soreness at your IV site. Follow these instructions at home: Medicines Take over-the-counter and prescription medicines only as told by your health care provider. Talk with your health care provider before you take any medicines that contain aspirin or NSAIDs. These medicines increase your risk for dangerous bleeding. General instructions Change or remove your dressing as   told by your health care provider. Return to your normal activities as told by  your health care provider. Ask your health care provider what activities are safe for you. Do not take baths, swim, or use a hot tub until your health care provider approves. Ask your health care provider if you may take showers. Check your IV site every day for signs of infection. Check for: Redness, swelling, or pain. Fluid or blood. If fluid or blood drains from your IV site, use your hands to press down firmly on a bandage covering the area for a minute or two. Doing this should stop the bleeding. Warmth. Pus or a bad smell. Keep all follow-up visits as told by your health care provider. This is important. Contact a health care provider if you have: A headache that does not go away with medicine. Hives, rash, or itchy skin. Nausea or vomiting. Unusual tiredness or weakness. Signs of infection at your IV site. Get help right away if: You have a fever or chills. You urinate less often than usual. Your urine is darker colored than normal. You have any of the following: Trouble breathing. Pain in your back, abdomen, or chest. Cool, clammy skin. A fast heartbeat. Summary Platelets are tiny pieces of blood cells that clump together to form a blood clot when you have an injury. If you have too few platelets, your blood may have trouble clotting. A platelet transfusion is a procedure in which you receive donated platelets through an IV. A platelet transfusion may be used to stop or prevent excessive bleeding. After the procedure, check your IV site every day for signs of infection, including redness, swelling, pain, or warmth. This information is not intended to replace advice given to you by your health care provider. Make sure you discuss any questions you have with your health care provider. Document Revised: 09/08/2020 Document Reviewed: 07/05/2017 Elsevier Patient Education  2022 [employer]  

## 2021-03-25 NOTE — Telephone Encounter (Signed)
CRITICAL VALUE STICKER  CRITICAL VALUE:  PLATELETS 6  RECEIVER (on-site recipient of call): Wendel Homeyer P. LPN  DATE & TIME NOTIFIED: 03/25/2021 8:26  MESSENGER (representative from lab): Ulice Dash  MD NOTIFIED: Adella Nissen  RESPONSE:  Patient is receiving platelet transfusion today

## 2021-03-26 ENCOUNTER — Encounter: Payer: Self-pay | Admitting: Family Medicine

## 2021-03-26 ENCOUNTER — Ambulatory Visit (INDEPENDENT_AMBULATORY_CARE_PROVIDER_SITE_OTHER): Payer: Medicare HMO | Admitting: Family Medicine

## 2021-03-26 VITALS — BP 121/78 | HR 93 | Temp 97.4°F | Ht 63.0 in | Wt 157.0 lb

## 2021-03-26 DIAGNOSIS — E1142 Type 2 diabetes mellitus with diabetic polyneuropathy: Secondary | ICD-10-CM

## 2021-03-26 DIAGNOSIS — G4700 Insomnia, unspecified: Secondary | ICD-10-CM

## 2021-03-26 DIAGNOSIS — S22000A Wedge compression fracture of unspecified thoracic vertebra, initial encounter for closed fracture: Secondary | ICD-10-CM | POA: Diagnosis not present

## 2021-03-26 DIAGNOSIS — D510 Vitamin B12 deficiency anemia due to intrinsic factor deficiency: Secondary | ICD-10-CM | POA: Diagnosis not present

## 2021-03-26 DIAGNOSIS — Z5320 Procedure and treatment not carried out because of patient's decision for unspecified reasons: Secondary | ICD-10-CM | POA: Diagnosis not present

## 2021-03-26 DIAGNOSIS — I10 Essential (primary) hypertension: Secondary | ICD-10-CM

## 2021-03-26 DIAGNOSIS — E785 Hyperlipidemia, unspecified: Secondary | ICD-10-CM | POA: Diagnosis not present

## 2021-03-26 DIAGNOSIS — M549 Dorsalgia, unspecified: Secondary | ICD-10-CM | POA: Diagnosis not present

## 2021-03-26 DIAGNOSIS — D696 Thrombocytopenia, unspecified: Secondary | ICD-10-CM

## 2021-03-26 DIAGNOSIS — C9310 Chronic myelomonocytic leukemia not having achieved remission: Secondary | ICD-10-CM

## 2021-03-26 LAB — PREPARE PLATELET PHERESIS: Unit division: 0

## 2021-03-26 LAB — BPAM PLATELET PHERESIS
Blood Product Expiration Date: 202210142359
ISSUE DATE / TIME: 202210130858
Unit Type and Rh: 8400

## 2021-03-26 MED ORDER — SENNOSIDES-DOCUSATE SODIUM 8.6-50 MG PO TABS: 2.0000 | ORAL_TABLET | Freq: Every day | ORAL | 2 refills | Status: DC

## 2021-03-26 MED ORDER — CYCLOBENZAPRINE HCL 10 MG PO TABS: 5.0000 mg | ORAL_TABLET | Freq: Three times a day (TID) | ORAL | 2 refills | Status: DC | PRN

## 2021-03-26 MED ORDER — METOPROLOL SUCCINATE ER 25 MG PO TB24: 25.0000 mg | ORAL_TABLET | Freq: Every day | ORAL | 1 refills | Status: DC

## 2021-03-26 MED ORDER — BISACODYL 10 MG RE SUPP: 10.0000 mg | Freq: Every day | RECTAL | 0 refills | Status: DC | PRN

## 2021-03-26 MED ORDER — POLYETHYLENE GLYCOL 3350 17 GM/SCOOP PO POWD: 17.0000 g | Freq: Two times a day (BID) | ORAL | 2 refills | Status: DC

## 2021-03-26 MED ORDER — SITAGLIPTIN PHOS-METFORMIN HCL 50-1000 MG PO TABS: 1.0000 | ORAL_TABLET | Freq: Every day | ORAL | 1 refills | Status: DC

## 2021-03-26 MED ORDER — OXYCODONE HCL 10 MG PO TABS: 10.0000 mg | ORAL_TABLET | Freq: Four times a day (QID) | ORAL | 0 refills | Status: DC

## 2021-03-26 MED FILL — Dexamethasone Sodium Phosphate Inj 100 MG/10ML: INTRAMUSCULAR | Qty: 1 | Status: AC

## 2021-03-26 NOTE — Patient Instructions (Signed)
  Great to see you today.  I have refilled the medication(s) we provide.   If labs were collected, we will inform you of lab results once received either by echart message or telephone call.   - echart message- for normal results that have been seen by the patient already.   - telephone call: abnormal results or if patient has not viewed results in their echart.   I have refilled all your meds we prescribe and your pain med.

## 2021-03-26 NOTE — Progress Notes (Signed)
Kathleen Gibson , 1951/02/22, 70 y.o., female MRN: [id number] Patient Care Team    Relationship Specialty Notifications Start End  [name], DO PCP - General Family Medicine  11/08/17   Doran Stabler, MD Consulting Physician Gastroenterology  04/06/17   Truitt Merle, MD Consulting Physician Hematology  04/06/17   Alda Berthold, [place] Physician Neurology  01/16/19     Chief Complaint  Patient presents with   Hospitalization Follow-up     Subjective:  Kathleen Gibson  is a 70 y.o. female presents for hospital follow up after recent admission on 03/07/2021 for primary diagnosis thoracic compression fracture. Kathleen Gibson was discharged on 03/12/2021 to home with DME, PT/OT. Patients discharge summary has been reviewed, as well as all labs/image studies obtained during hospitalization.   Patients hospital course: Patient presented to the emergency room for severe back pain and was found to have a T7-T8 compression fracture.  She has a history of [name] and thrombocytopenia which is being managed by oncology.  Her pain was managed with IV opiates and then transition to Tylenol/Flexeril/oxycodone.  She had a short course of Decadron, that was DC'd after their discussion with oncology. COVID test negative.  CMP unremarkable.  CBC with elevated WBC and platelets 5-20. Since hospital discharge patient reports patient reports overall she has been doing well.  She has been wearing her back brace.  She has started working with physical therapy in the home.  She has run out of her pain medications.  She was extremely reluctant to take the medication, however her pain had become unbearable.  He has followed with her oncologist already and has had repeat labs which were reviewed today.  Recent Labs  Lab 03/25/21 0743 03/29/21 0745  HGB 8.2* 8.5*  HCT 26.2* 26.8*  WBC 5.9 5.8  PLT 6* 46*   CMP Latest Ref Rng & Units 03/29/2021 03/25/2021 03/22/2021  Glucose 70 - 99 mg/dL 201(H) 129(H) 173(H)   BUN 8 - 23 mg/dL _0 Creatinine 0.44 - 1.00 mg/dL 0.80 0.65 0.74  Sodium 135 - 145 mmol/L 138 138 140  Potassium 3.5 - 5.1 mmol/L 4.3 4.1 4.1  Chloride 98 - 111 mmol/L 105 106 108  CO2 22 - 32 mmol/L 21(L) 27 24  Calcium 8.9 - 10.3 mg/dL 8.6(L) 8.6(L) 8.6(L)  Total Protein 6.5 - 8.1 g/dL 7.2 7.1 7.0  Total Bilirubin 0.3 - 1.2 mg/dL 0.7 0.7 0.7  Alkaline Phos 38 - 126 U/L 154(H) 133(H) 125  AST 15 - 41 U/L _1 ALT 0 - 44 U/L _2 MR THORACIC SPINE W WO CONTRAST  Result Date: 03/06/2021 CLINICAL DATA:  70 year old female with myeloproliferative disorder, hematologic malignancy. Severe thrombocytopenia. Persistent back pain and leg weakness. EXAM: MRI THORACIC WITHOUT AND WITH CONTRAST TECHNIQUE: Multiplanar and multiecho pulse sequences of the thoracic spine were obtained without and with intravenous contrast. CONTRAST:  37m GADAVIST GADOBUTROL 1 MMOL/ML IV SOLN COMPARISON:  Chest CT 10/29/2020 FINDINGS: Limited cervical spine imaging: Preserved cervical lordosis. Disc and endplate degeneration appears age-appropriate. Diffusely decreased marrow signal in the cervical spine, see below. No destructive cervical spine lesion is evident. Thoracic spine segmentation:  Normal on the comparison. Alignment:  Stable thoracic kyphosis.  No spondylolisthesis. Vertebrae: Compression fracture of the T7 vertebral body with about 15% loss of vertebral body height. Compression fracture of the T8 vertebral body with greater up to 30% loss of  vertebral body height. No retropulsion of bone at either level. Patchy marrow edema and enhancement at both levels, slightly more diffuse at T7. And background abnormally decreased T1 marrow signal throughout the visible spine and ribs. But no other levels of marrow edema or abnormal marrow enhancement. Cord: Normal. Capacious thoracic spinal canal at most levels. No abnormal intradural enhancement. No dural thickening. Paraspinal and other soft tissues:  There is confluent paraspinal soft tissue edema at T7 and T8, maximal at the former on the left (series 21, image 24) where the pre contrast T1 signal raises the possibility of extraosseous extension of tumor there. But that is not corroborated on T2 imaging. And either confluent soft tissue edema or small paraspinal hematoma is suspected there. Negative visible thoracic and upper abdominal viscera. Disc levels: Most thoracic disc levels are normal for age. However, there is occasional mild degenerative thoracic facet and ligament flavum hypertrophy - and this includes T7-T8 and T8-T9. At T8-T9 there is a small left lateral recess disc protrusion (series 19, image 31) superimposed on the fracture. And combined with facet hypertrophy there is moderate bilateral T8 neural foraminal stenosis. But no spinal stenosis. At T11-T12 there is a small left paracentral disc protrusion. No associated stenosis. IMPRESSION: 1. Acute or subacute pathologic compression fractures of T7 and T8. Greater loss of height at the latter (30%) and up to moderate bilateral T8 neural foraminal stenosis, although primarily degenerative in nature with no significant retropulsion of bone. No thoracic spinal stenosis. 2. Diffusely abnormal marrow signal in the visible spine and ribs. This could reflect a diffuse malignant marrow replacement, versus a generalized red marrow reactivation in the setting of anemia/thrombocytopenia 3. There is no extraosseous extension of any abnormal soft tissue from the affected vertebrae, although that possibility was raised at T7 but felt instead to be paraspinal edema or hematoma related to #1. Electronically Signed   By: Genevie Ann M.D.   On: 03/06/2021 12:04   DG CHEST PORT 1 VIEW  Result Date: 03/07/2021 CLINICAL DATA:  Back pain EXAM: PORTABLE CHEST 1 VIEW COMPARISON:  01/04/2021 FINDINGS: Right PICC line is in place with the tip in the SVC. Low lung volumes with bibasilar atelectasis. Heart is normal size.  No effusions or acute bony abnormality. IMPRESSION: Right PICC line tip in the SVC. Low lung volumes, bibasilar atelectasis. Electronically Signed   By: Rolm Baptise M.D.   On: 03/07/2021 22:06     Depression screen [facility] 2/9 03/26/2021 12/22/2020 10/28/2020 05/22/2020 04/15/2019  Decreased Interest 0 2 1 0 0  Down, Depressed, Hopeless 0 1 1 0 0  PHQ - 2 Score 0 3 2 0 0  Altered sleeping - 3 0 - -  Tired, decreased energy - 1 3 - -  Change in appetite - 0 2 - -  Feeling bad or failure about yourself  - 0 1 - -  Trouble concentrating - 1 2 - -  Moving slowly or fidgety/restless - 0 1 - -  Suicidal thoughts - 0 0 - -  PHQ-9 Score - 8 11 - -  Some recent data might be hidden    Allergies  Allergen Reactions   Asa [Aspirin] Other (See Comments)    Contraindicated d/t low plts   Nsaids Other (See Comments)    Contraindicated d/t low plts   Social History   Tobacco Use   Smoking status: Never   Smokeless tobacco: Never  Substance Use Topics   Alcohol use: No   Past Medical  History:  Diagnosis Date   Diabetes ([place])    HLD (hyperlipidemia)    Hypertension    Pernicious anemia    Pneumonia 09/05/2019   Renal insufficiency 09/06/2019   Vitamin D deficiency    Past Surgical History:  Procedure Laterality Date   ABDOMINAL HERNIA REPAIR  2009   ABDOMINOPLASTY     AXILLARY LYMPH NODE BIOPSY Left 11/11/2020   Procedure: LEFT AXILLARY EXCISIONAL LYMPH NODE BIOPSY;  Surgeon: [name], MD;  Location: [facility];  Service: General;  Laterality: Left;   [place]   hemorroid surgery  2007   TONSILLECTOMY AND ADENOIDECTOMY  1957   Family History  Problem Relation Age of Onset   Cancer Mother 63       unknown type cancer    Hypertension Mother    Hypertension Father    Heart failure Father    Pneumonia Father    Diabetes Sister    Leukemia Brother    Colon cancer Brother 47   Heart Problems Brother    Leukemia Other    Diabetes Sister    Heart  Problems Sister    Cancer Brother    Heart Problems Brother    Allergies as of 03/26/2021       Reactions   Asa [aspirin] Other (See Comments)   Contraindicated d/t low plts   Nsaids Other (See Comments)   Contraindicated d/t low plts        Medication List        Accurate as of March 26, 2021 11:59 PM. If you have any questions, ask your nurse or doctor.          acetaminophen 500 MG tablet Commonly known as: TYLENOL Take 2 tablets (1,000 mg total) by mouth every 8 (eight) hours.   bisacodyl 10 MG suppository Commonly known as: DULCOLAX Place 1 suppository (10 mg total) rectally daily as needed for moderate constipation (Please take-if no bowel movement in 1-2 days with MiraLAX/senna).   cyclobenzaprine 10 MG tablet Commonly known as: FLEXERIL Take 0.5-1 tablets (5-10 mg total) by mouth 3 (three) times daily as needed for muscle spasms. What changed:  medication strength how much to take Changed by: Howard Pouch, DO   gabapentin 300 MG capsule Commonly known as: NEURONTIN Take 2 capsules (600 mg total) by mouth 2 (two) times daily.   metoprolol succinate 25 MG 24 hr tablet Commonly known as: TOPROL-XL Take 1 tablet (25 mg total) by mouth daily.   Narcan 4 MG/0.1ML Liqd nasal spray kit Generic drug: naloxone Place 1 spray into the nose once as needed (overdose).   Oxycodone HCl 10 MG Tabs Take 1 tablet (10 mg total) by mouth in the morning, at noon, in the evening, and at bedtime. What changed:  when to take this reasons to take this Changed by: Howard Pouch, DO   polyethylene glycol powder 17 GM/SCOOP powder Commonly known as: GLYCOLAX/MIRALAX Dissolve 1 capful (17 g) in water and drink 2 (two) times daily.   senna-docusate 8.6-50 MG tablet Commonly known as: Senokot-S Take 2 tablets by mouth at bedtime.   sitaGLIPtin-metformin 50-1000 MG tablet Commonly known as: JANUMET Take 1 tablet by mouth at bedtime.        All past medical history,  surgical history, allergies, family history, immunizations and medications were updated in the EMR today and reviewed under the history and medication portions of their EMR.      ROS: Negative, with the  exception of above mentioned in HPI   Objective:  BP 121/78   Pulse 93   Temp (!) 97.4 F (36.3 C) (Oral)   Ht _0  (1.6 m)   Wt 157 lb (71.2 kg)   SpO2 99%   BMI 27.81 kg/m  Body mass index is 27.81 kg/m. Gen: Afebrile. No acute distress. Nontoxic in appearance, well developed, well nourished.  Very pleasant female HENT: AT. [place].  No cough or hoarseness present Eyes:Pupils Equal Round Reactive to light, Extraocular movements intact,  Conjunctiva without redness, discharge or icterus. CV: RRR Chest: CTAB, no wheeze or crackles. Good air movement, normal resp effort.  MSK: Brace in place. Neuro:  Normal gait. PERLA. EOMi. Alert. Oriented x3 Psych: Normal affect, dress and demeanor. Normal speech. Normal thought content and judgment.   Assessment/Plan: Kathleen Gibson is a 70 y.o. female present for OV for Hospital discharge follow up Type 2 diabetes mellitus with diabetic polyneuropathy, without long-term current use of insulin (HCC) Elevated sugars then patient likely secondary to her Decadron injection.  Since that time sugars have been stabilized. Continue Janumet Continue gabapentin 300 mg twice daily  ANEMIA, PERNICIOUS Continue B12 injections every 4 weeks indefinitely  INSOMNIA, CHRONIC Been placed on hold for now during use of Flexeril and chronic opioids.  Essential hypertension/Statin declined/HLD Stable. Continue metoprolol  Thrombocytopenia (HCC)/Chronic myelomonocytic leukemia not having achieved remission [facility]) Has routine follow-ups with her oncology team.  Intractable back pain/Compression fracture of body of thoracic vertebra ([place]) Pain is still present.  Encourage patient continue Flexeril as needed.  If this medication makes her too drowsy during the day  she can always take half a tab. Refilled oxycodone 10 mg tab up to 4 times a day.  Patient was encouraged to not let the pain build up to being uncontrolled before taking the medication.  She can consider decreasing dose to as needed to a half a tab every 6 hours or a full tab every 8 hours, and then continue to slowly decrease to every 12 hours as needed etc.  Goal is to keep her pain controlled and allow her to perform PT and heal from injury, without excessive opiates.  Patient reports understanding. Patient was encouraged to start Senokot 1-2 tabs nightly to avoid constipation with opiate use.  Reviewed expectations re: course of current medical issues. Discussed self-management of symptoms. Outlined signs and symptoms indicating need for more acute intervention. Patient verbalized understanding and all questions were answered. Patient received an After-Visit Summary. Any changes in medications were reviewed and patient was provided with updated med list with their AVS.     No orders of the defined types were placed in this encounter. Meds ordered this encounter  Medications   cyclobenzaprine (FLEXERIL) 10 MG tablet    Sig: Take 0.5-1 tablets (5-10 mg total) by mouth 3 (three) times daily as needed for muscle spasms.    Dispense:  90 tablet    Refill:  2   bisacodyl (DULCOLAX) 10 MG suppository    Sig: Place 1 suppository (10 mg total) rectally daily as needed for moderate constipation (Please take-if no bowel movement in 1-2 days with MiraLAX/senna).    Dispense:  12 suppository    Refill:  0   metoprolol succinate (TOPROL-XL) 25 MG 24 hr tablet    Sig: Take 1 tablet (25 mg total) by mouth daily.    Dispense:  90 tablet    Refill:  1    Please remind patient this is now  ONCE a day (changed from tartrate to succinate)   polyethylene glycol powder (GLYCOLAX/MIRALAX) 17 GM/SCOOP powder    Sig: Dissolve 1 capful (17 g) in water and drink 2 (two) times daily.    Dispense:  510 g     Refill:  2   senna-docusate (SENOKOT-S) 8.6-50 MG tablet    Sig: Take 2 tablets by mouth at bedtime.    Dispense:  60 tablet    Refill:  2   sitaGLIPtin-metformin (JANUMET) 50-1000 MG tablet    Sig: Take 1 tablet by mouth at bedtime.    Dispense:  90 tablet    Refill:  1   Oxycodone HCl 10 MG TABS    Sig: Take 1 tablet (10 mg total) by mouth in the morning, at noon, in the evening, and at bedtime.    Dispense:  120 tablet    Refill:  0       Note is dictated utilizing voice recognition software. Although note has been proof read prior to signing, occasional typographical errors still can be missed. If any questions arise, please do not hesitate to call for verification.   electronically signed by:  Howard Pouch, DO  [place]

## 2021-03-29 ENCOUNTER — Inpatient Hospital Stay: Payer: Medicare HMO

## 2021-03-29 ENCOUNTER — Inpatient Hospital Stay (HOSPITAL_BASED_OUTPATIENT_CLINIC_OR_DEPARTMENT_OTHER): Payer: Medicare HMO | Admitting: Hematology

## 2021-03-29 ENCOUNTER — Encounter: Payer: Self-pay | Admitting: Hematology

## 2021-03-29 ENCOUNTER — Telehealth: Payer: Self-pay | Admitting: Family Medicine

## 2021-03-29 ENCOUNTER — Other Ambulatory Visit: Payer: Self-pay

## 2021-03-29 VITALS — BP 117/66 | HR 100 | Temp 98.8°F | Resp 18 | Wt 154.7 lb

## 2021-03-29 DIAGNOSIS — C9310 Chronic myelomonocytic leukemia not having achieved remission: Secondary | ICD-10-CM | POA: Diagnosis not present

## 2021-03-29 DIAGNOSIS — Z5111 Encounter for antineoplastic chemotherapy: Secondary | ICD-10-CM | POA: Diagnosis not present

## 2021-03-29 DIAGNOSIS — Z95828 Presence of other vascular implants and grafts: Secondary | ICD-10-CM

## 2021-03-29 DIAGNOSIS — Z79899 Other long term (current) drug therapy: Secondary | ICD-10-CM | POA: Diagnosis not present

## 2021-03-29 DIAGNOSIS — I10 Essential (primary) hypertension: Secondary | ICD-10-CM | POA: Diagnosis not present

## 2021-03-29 DIAGNOSIS — K5900 Constipation, unspecified: Secondary | ICD-10-CM | POA: Diagnosis not present

## 2021-03-29 DIAGNOSIS — Z7984 Long term (current) use of oral hypoglycemic drugs: Secondary | ICD-10-CM | POA: Diagnosis not present

## 2021-03-29 DIAGNOSIS — D693 Immune thrombocytopenic purpura: Secondary | ICD-10-CM | POA: Diagnosis not present

## 2021-03-29 DIAGNOSIS — E1142 Type 2 diabetes mellitus with diabetic polyneuropathy: Secondary | ICD-10-CM | POA: Diagnosis not present

## 2021-03-29 DIAGNOSIS — R519 Headache, unspecified: Secondary | ICD-10-CM | POA: Diagnosis not present

## 2021-03-29 DIAGNOSIS — D696 Thrombocytopenia, unspecified: Secondary | ICD-10-CM

## 2021-03-29 LAB — CBC WITH DIFFERENTIAL (CANCER CENTER ONLY)
Abs Immature Granulocytes: 0.09 10*3/uL — ABNORMAL HIGH (ref 0.00–0.07)
Basophils Absolute: 0.1 10*3/uL (ref 0.0–0.1)
Basophils Relative: 1 %
Eosinophils Absolute: 0 10*3/uL (ref 0.0–0.5)
Eosinophils Relative: 0 %
HCT: 26.8 % — ABNORMAL LOW (ref 36.0–46.0)
Hemoglobin: 8.5 g/dL — ABNORMAL LOW (ref 12.0–15.0)
Immature Granulocytes: 2 %
Lymphocytes Relative: 14 %
Lymphs Abs: 0.8 10*3/uL (ref 0.7–4.0)
MCH: 27.3 pg (ref 26.0–34.0)
MCHC: 31.7 g/dL (ref 30.0–36.0)
MCV: 86.2 fL (ref 80.0–100.0)
Monocytes Absolute: 1.2 10*3/uL — ABNORMAL HIGH (ref 0.1–1.0)
Monocytes Relative: 21 %
Neutro Abs: 3.7 10*3/uL (ref 1.7–7.7)
Neutrophils Relative %: 62 %
Platelet Count: 46 10*3/uL — ABNORMAL LOW (ref 150–400)
RBC: 3.11 MIL/uL — ABNORMAL LOW (ref 3.87–5.11)
RDW: 17.5 % — ABNORMAL HIGH (ref 11.5–15.5)
WBC Count: 5.8 10*3/uL (ref 4.0–10.5)
nRBC: 0 % (ref 0.0–0.2)

## 2021-03-29 LAB — CMP (CANCER CENTER ONLY)
ALT: 14 U/L (ref 0–44)
AST: 16 U/L (ref 15–41)
Albumin: 3.3 g/dL — ABNORMAL LOW (ref 3.5–5.0)
Alkaline Phosphatase: 154 U/L — ABNORMAL HIGH (ref 38–126)
Anion gap: 12 (ref 5–15)
BUN: 15 mg/dL (ref 8–23)
CO2: 21 mmol/L — ABNORMAL LOW (ref 22–32)
Calcium: 8.6 mg/dL — ABNORMAL LOW (ref 8.9–10.3)
Chloride: 105 mmol/L (ref 98–111)
Creatinine: 0.8 mg/dL (ref 0.44–1.00)
GFR, Estimated: >60 mL/min (ref >60)
Glucose, Bld: 201 mg/dL — ABNORMAL HIGH (ref 70–99)
Potassium: 4.3 mmol/L (ref 3.5–5.1)
Sodium: 138 mmol/L (ref 135–145)
Total Bilirubin: 0.7 mg/dL (ref 0.3–1.2)
Total Protein: 7.2 g/dL (ref 6.5–8.1)

## 2021-03-29 MED ORDER — PALONOSETRON HCL INJECTION 0.25 MG/5ML
0.2500 mg | Freq: Once | INTRAVENOUS | Status: AC
  Administered 2021-03-29: 0.25 mg via INTRAVENOUS
  Filled 2021-03-29: qty 5

## 2021-03-29 MED ORDER — ROMIPLOSTIM INJECTION 500 MCG
10.0000 ug/kg | Freq: Once | SUBCUTANEOUS | Status: AC
  Administered 2021-03-29: 700 ug via SUBCUTANEOUS
  Filled 2021-03-29: qty 1

## 2021-03-29 MED ORDER — SODIUM CHLORIDE 0.9 % IV SOLN: Freq: Once | INTRAVENOUS | Status: AC

## 2021-03-29 MED ORDER — SODIUM CHLORIDE 0.9% FLUSH
10.0000 mL | INTRAVENOUS | Status: DC | PRN
  Administered 2021-03-29: 10 mL

## 2021-03-29 MED ORDER — HEPARIN SOD (PORK) LOCK FLUSH 100 UNIT/ML IV SOLN
500.0000 [IU] | Freq: Once | INTRAVENOUS | Status: AC | PRN
  Administered 2021-03-29: 500 [IU]

## 2021-03-29 MED ORDER — SODIUM CHLORIDE 0.9 % IV SOLN
10.0000 mg | Freq: Once | INTRAVENOUS | Status: AC
  Administered 2021-03-29: 10 mg via INTRAVENOUS
  Filled 2021-03-29: qty 10

## 2021-03-29 MED ORDER — SODIUM CHLORIDE 0.9% FLUSH
10.0000 mL | INTRAVENOUS | Status: AC | PRN
  Administered 2021-03-29: 10 mL

## 2021-03-29 MED ORDER — SODIUM CHLORIDE 0.9 % IV SOLN
75.0000 mg/m2 | Freq: Once | INTRAVENOUS | Status: AC
  Administered 2021-03-29: 135 mg via INTRAVENOUS
  Filled 2021-03-29: qty 13.5

## 2021-03-29 MED FILL — Dexamethasone Sodium Phosphate Inj 100 MG/10ML: INTRAMUSCULAR | Qty: 1 | Status: AC

## 2021-03-29 NOTE — Progress Notes (Signed)
No platelet transfusion necessary today due to platelets being 46K.

## 2021-03-29 NOTE — Progress Notes (Signed)
[place]   Telephone:(336) [phone] Fax:(336) [phone]   Clinic Follow up Note   Patient Care Team: [name], DO as PCP - General (Family Medicine) Loletha Carrow Kirke Corin, MD as Consulting Physician (Gastroenterology) Truitt Merle, MD as Consulting Physician (Hematology) Alda Berthold, DO as Consulting Physician (Neurology)  Date of Service:  03/29/2021  CHIEF COMPLAINT: f/u of [name], severe thrombocytopenia  CURRENT THERAPY:  Azacitidine days 1-5 q28 days, started 12/28/20 Nplate 69mcg/kg weekly  Platelet transfusion as needed (plt<15K)  ASSESSMENT & PLAN:  Kathleen Gibson is a 70 y.o. female with   1. [name] with severe thrombocytopenia  -She was initially diagnosed with ITP in 2014. She lost follow up after 11/2017 and did not proceed with recommended bone marrow biopsy. -She was referred to ED 10/28/20 by her PCP for low plt count of 4k. She was hospitalized and treated with platelet transfusions, IVIG, oral prednisone $RemoveBeforeD'60mg'GuKImVzJphOQLn$  and Nplate injections. She tapered off prednisone given little response. -Bone marrow biopsy 10/30/20 felt to likely represent MDS/MPN, particularly [name]. Confirmed diagnosis of [name] on slide review at [facility] by Dr. Linus Orn -She began azacitidine daily days 1-5 q. 28 days on 12/28/20, s/p 3 cycles  -She had worsening thrombocytopenia after stopping Nplate, restarted weekly Nplate 10 mcg/kg on 1/58/3094 -Her thrombocytopenia did improve after chemo, but it has been intermittently very low  -BM biopsy at [facility] on 03/18/21 showed stable disease, no increase in blasts. I reviewed and discussed with pt  -Labs reviewed, plt 46k today. Will start C4 chemo today    2. Normocytic anemia, moderate  -h/o pernicious anemia/vitamin B12 deficiency -with recent severe thrombocytopenia, she has had loq ferritin and iron study with moderate anemia. She required blood transfusion in hospital. Will give blood transfusion for Hg <=7.5.  -Hg 8.5 today  (03/29/21), no need blood transfusion    3. Left UQ abdominal pain, back pain from T7/T8 compression fracture -abdominal pain likely secondary to splenomegaly. Currently tolerable, continue monitoring. -back pain began in early 01/2021.  -thoracic spine MRI on 03/05/21 showed pathologic compression fractures of T7 and T8, and diffusely abnormal marrow signal. -She was hospitalized on 03/07/21 for severe back pain. She is now wearing a brace and has flexeril and oxycodone. -if her platelets remain above 30K, she can proceed with kyphoplasty. She preferred to have treatment at [facility], she was referred by Dr. Linus Orn and has appointment next week    4. Constipation -likely secondary to pain medication -she notes she has miralax, senokot, and dulcolax -I previously recommended she try milk of magnesium.   5. Headache -no other neurological symptoms, will continue monitor clinically, take Tylenol as needed   6. Type 2 diabetes mellitus with polyneuropathy, Hypertension -Continue medication, she will follow-up with her family doctor     PLAN:  -proceed with Azacitadine infusion daily today through 10/21 -Lab and 1u plt transfusion on Mondays and Thursdays if plt<15K for next 4 weeks -continue Nplate at 07WKG/SU weekly on Mondays  -Azacitadine daily X5 starting in 4 weeks -F/u in 2 and 4 weeks    No problem-specific Assessment & Plan notes found for this encounter.   SUMMARY OF ONCOLOGIC HISTORY: Oncology History  [name] (chronic myelomonocytic leukemia) ([place])  10/29/2020 Imaging   CT CAP  IMPRESSION: 1. Splenomegaly with pathologically enlarged lymph nodes above and below the diaphragm, with overall stable to minimally increased abdominal adenopathy and interval progression of the pelvic adenopathy. 2. Small volume abdominopelvic ascites with diffuse mesenteric  stranding. 3. Scattered bilateral pulmonary micro nodules measuring 1-2 mm. 4. Distended gallbladder with some layering hyperdense  material representing layering sludge and tiny stones seen on prior ultrasound. 5. Aortic atherosclerosis.   10/30/2020 Pathology Results   DIAGNOSIS:   BONE MARROW, ASPIRATE, CLOT, CORE:  -  Hypercellular bone marrow with panhyperplasia, atypia, and no  increase in blasts  -  See comment   PERIPHERAL BLOOD:  -  Marked thrombocytopenia  -  Absolute monocytosis  -  Normocytic anemia  -  See CBC data and comment   COMMENT:  The bone marrow is hypercellular for age (approximately 80%) with myeloid hyperplasia with maturational left shift, erythroid hyperplasia, and increased megakaryocytes.  Mild multilineage atypia is present. Blasts are not increased on aspirate smears (1% by manual differential count) or by CD34 immunohistochemistry on the core biopsy.  Concurrent flow cytometric analysis of the bone marrow aspirate demonstrates increased monocytes, and no increase in blasts or abnormal lymphoid population (see [id number]).  Monocytes are also increased in peripheral  blood, persistent per electronic medical record.  In aggregate, the  findings raise the possibility of a myeloid neoplasm with the  differential diagnosis including a low-grade myelodysplastic syndrome and chronic myelomonocytic leukemia (dysplastic type).    ADDENDUM:  A reticulin special stain performed on the bone marrow core biopsy reveals no significant increase in reticulin fibrosis.  ADDENDUM:  CYTOGENETIC RESULTS:  Karyotype: 46,XX[20]  Interpretation: NORMAL FEMALE KARYOTYPE   FISH RESULTS:  Results: NORMAL   ADDENDUM:  CD123 immunohistochemistry performed on the core biopsy highlights scattered aggregates of positively staining cells consistent with plasmacytoid dendritic cells.    10/30/2020 Pathology Results   DIAGNOSIS:   BONE MARROW; FLOW CYTOMETRIC ANALYSIS:  -  Increased monocytes  -  Scant B-cells present  -  No immunophenotypically aberrant T-cell population identified  -  No increase in  blasts  -  See comment   COMMENT:  Monocytes are relatively increased (12% of all cells), without aberrant expression of CD56.  B-cells comprise <1% of total lymphocytes. CD34-positive blasts are not increased (<1% of all cells).  Correlation with concurrent morphology is recommended for complete diagnostic interpretation and overall blast enumeration (see [id number]).    10/30/2020 Pathology Results   FINAL MICROSCOPIC DIAGNOSIS:   A. LYMPH NODE, RIGHT AXILLARY, NEEDLE CORE BIOPSY:  -Lymphoid tissue present  -See comment   COMMENT:  The sections show several small needle core biopsy fragments of lymphoid tissue displaying degenerative cellular changes/necrosis and hence cannot be accurately evaluated.  Sample for flow cytometric analysis not available.  Immunohistochemical stain for CD3 and CD20 were performed with appropriate controls.  There is a mixture of T and B cells in their apparently respective compartments.  There is no definite metastatic malignancy.    11/11/2020 Pathology Results   DIAGNOSIS:   LEFT AXILLARY LYMPH NODE EXCISIONAL BIOPSY; FLOW CYTOMETRIC ANALYSIS:  -  No monotypic B-cell or immunophenotypically aberrant T-cell  population identified  -  See comment   COMMENT:  Flow cytometric analysis identifies B-cells with a normal kappa:lambda ratio of 1.7:1.  A subset of the polytypic B-cells expresses CD10. T-cells show a CD4:CD8 ratio of 3.3:1 without immunophenotypic aberrancy with the markers evaluated.  Although these results do not support the diagnosis of a clonal lymphoid population, sampling issues must always be considered when negative results are obtained, as focal lesions may not be represented in the specimen submitted.  In addition, flow cytometric immunophenotyping will not exclude other pathology if present (e.g.  Hodgkin lymphoma, some T-cell lymphomas, metastatic and infectious diseases).   11/11/2020 Pathology Results   FINAL MICROSCOPIC DIAGNOSIS:   A.  LYMPH NODE, LEFT AXILLARY, DISSECTION:  -  Follicular hyperplasia with interfollicular expansion  -  See comment   COMMENT:  Sections of the lymph nodes reveal generally preserved lymph node architecture.  There is follicular hyperplasia with some follicles showing increased hyalinization of germinal centers and mild concentric encircling of germinal centers with small lymphocytes (onion skinning). Interfollicular areas are expanded with increased vascular proliferation, small lymphocytes, plasma cells, histiocytic cells, and patchy neutrophils.  The lymph node capsule is variably thickened by fibrosis with few plasma cells.   A panel of immunohistochemical stains is performed for further  characterization.  CD3 and CD20/PAX5 highlight T-cell and B-cell  compartments, respectively.  Germinal centers are highlighted by CD10 and BCL6 with appropriate absent expression of BCL2.  CD5 is similar to CD3.  CD43 also stains the T-cells.  CD4-positive T-cells exceed CD8-positive T-cells.  CD30 highlights scattered immunoblasts.  CD21 reveals intact follicular dendritic cell meshworks.  CD68 highlights increased histiocytic cells.  HHV 8 is negative.  T. pallidum reveals no definitive organisms.  Pancytokeratin is negative.  TdT stains rare cells.  CD138 highlights plasma cells which are not increased in number and show polytypic light chain expression by kappa/lambda in situ  hybridization.  EBV is negative by in situ hybridization.   Concurrent flow cytometric analysis is negative for a monoclonal B-cell or immunophenotypically aberrant T-cell population (see [phone]).   Together, the findings above are non-specific and can be seen in  reactive and infectious processes as well as [place] disease.  There is no definitive morphologic or flow cytometric evidence of involvement by a lymphoproliferative disorder from the current workup.   12/17/2020 Initial Diagnosis   [name] (chronic myelomonocytic leukemia)  (HCC)   12/28/2020 -  Chemotherapy   Patient is on Treatment Plan : MYELODYSPLASIA  Azacitidine IV D1-5 q28d     03/18/2021 Pathology Results   Final Diagnosis    BONE MARROW:             Persistent chronic myelomonocytic leukemia in a hypercellular bone marrow with increased monocytes and no increase in blasts.   Comment    Flow cytometric analysis showed no increase in blasts and 17% monocytes.A next generation myeloid panel showed the presence of the following mutations: NRAS, SH2B3, PHF6 and TET2. CD34 and CD117 immunstains do not show an increase in blasts. The findings are consistent with persistent [name] with a decrease in the number of blasts compared to that seen on the previous bone marrow.    Final Interpretation      BONE MARROW; FLOW CYTOMETRIC ANALYSIS:   No increased blasts identified. (see comment)  Monocytosis (17% of total events).    COMMENT: Flow cytometry identified about 0.6% of total events as immature cells. These cells express CD45 (dim), CD34, CD117, HLA-DR, CD38, CD33 (variable). This immunophenotype is consistent with normal myeloblasts. In addition, monocytes are increased and comprise of approximately 17% of total events. These monocytes show normal expression of CD33/CD64/CD14/CD11b with variable expression of CD4. Correlation with concurrent morphology and clinical data is recommended (see [id number]).    FLOW CYTOMETRY ANALYSIS: CD45 versus side scatter analysis demonstrate a predominance of granulocytes (~70%), monocytes (~17%) and lymphocytes (~6%). No significant increase in blasts identified. All tested markers were used for adequate analysis of the cells and were appropriately reviewed.        INTERVAL HISTORY:  Kathleen Gibson is here for a follow up of [name], severe thrombocytopenia. She was last seen by me on 03/15/21. She presents to the clinic alone. She reports she is doing okay. She reports her back still hurts. She notes the bruises on her arm are  very slowly improving.   All other systems were reviewed with the patient and are negative.  MEDICAL HISTORY:  Past Medical History:  Diagnosis Date   Diabetes ([place])    HLD (hyperlipidemia)    Hypertension    Pernicious anemia    Pneumonia 09/05/2019   Renal insufficiency 09/06/2019   Vitamin D deficiency     SURGICAL HISTORY: Past Surgical History:  Procedure Laterality Date   ABDOMINAL HERNIA REPAIR  2009   ABDOMINOPLASTY     AXILLARY LYMPH NODE BIOPSY Left 11/11/2020   Procedure: LEFT AXILLARY EXCISIONAL LYMPH NODE BIOPSY;  Surgeon: [name], MD;  Location: [facility] OR;  Service: General;  Laterality: Left;   [place]   hemorroid surgery  2007   [place]    I have reviewed the social history and family history with the patient and they are unchanged from previous note.  ALLERGIES:  is allergic to asa [aspirin] and nsaids.  MEDICATIONS:  Current Outpatient Medications  Medication Sig Dispense Refill   acetaminophen (TYLENOL) 500 MG tablet Take 2 tablets (1,000 mg total) by mouth every 8 (eight) hours. 30 tablet 0   bisacodyl (DULCOLAX) 10 MG suppository Place 1 suppository (10 mg total) rectally daily as needed for moderate constipation (Please take-if no bowel movement in 1-2 days with MiraLAX/senna). 12 suppository 0   cyclobenzaprine (FLEXERIL) 10 MG tablet Take 0.5-1 tablets (5-10 mg total) by mouth 3 (three) times daily as needed for muscle spasms. 90 tablet 2   gabapentin (NEURONTIN) 300 MG capsule Take 2 capsules (600 mg total) by mouth 2 (two) times daily. 360 capsule 1   metoprolol succinate (TOPROL-XL) 25 MG 24 hr tablet Take 1 tablet (25 mg total) by mouth daily. 90 tablet 1   naloxone (NARCAN) nasal spray 4 mg/0.1 mL Place 1 spray into the nose once as needed (overdose). 2 each 0   Oxycodone HCl 10 MG TABS Take 1 tablet (10 mg total) by mouth in the morning, at noon, in the evening, and at bedtime. 120  tablet 0   polyethylene glycol powder (GLYCOLAX/MIRALAX) 17 GM/SCOOP powder Dissolve 1 capful (17 g) in water and drink 2 (two) times daily. 510 g 2   senna-docusate (SENOKOT-S) 8.6-50 MG tablet Take 2 tablets by mouth at bedtime. 60 tablet 2   sitaGLIPtin-metformin (JANUMET) 50-1000 MG tablet Take 1 tablet by mouth at bedtime. 90 tablet 1   No current facility-administered medications for this visit.   Facility-Administered Medications Ordered in Other Visits  Medication Dose Route Frequency Provider Last Rate Last Admin   0.9 %  sodium chloride infusion   Intravenous Once Truitt Merle, MD       [facility]) 135 mg in sodium chloride 0.9 % 50 mL chemo infusion  75 mg/m2 (Treatment Plan Recorded) Intravenous Once Truitt Merle, MD       dexamethasone (DECADRON) 10 mg in sodium chloride 0.9 % 50 mL IVPB  10 mg Intravenous Once Truitt Merle, MD       heparin lock flush 100 unit/mL  500 Units Intracatheter Once PRN Truitt Merle, MD       palonosetron (ALOXI) injection 0.25 mg  0.25  mg Intravenous Once Truitt Merle, MD       sodium chloride flush (NS) 0.9 % injection 10 mL  10 mL Intracatheter PRN Truitt Merle, MD        PHYSICAL EXAMINATION: ECOG PERFORMANCE STATUS: 2 - Symptomatic, <50% confined to bed  Vitals:   03/29/21 0803  BP: 117/66  Pulse: 100  Resp: 18  Temp: 98.8 F (37.1 C)  SpO2: 99%   Wt Readings from Last 3 Encounters:  03/29/21 154 lb 11.2 oz (70.2 kg)  03/26/21 157 lb (71.2 kg)  03/15/21 154 lb 11.2 oz (70.2 kg)     GENERAL:alert, no distress and comfortable SKIN: skin color normal, no rashes or significant lesions EYES: normal, Conjunctiva are pink and non-injected, sclera clear  NEURO: alert & oriented x 3 with fluent speech  LABORATORY DATA:  I have reviewed the data as listed CBC Latest Ref Rng & Units 03/29/2021 03/25/2021 03/22/2021  WBC 4.0 - 10.5 K/uL 5.8 5.9 6.5  Hemoglobin 12.0 - 15.0 g/dL 8.5(L) 8.2(L) 8.5(L)  Hematocrit 36.0 - 46.0 % 26.8(L) 26.2(L) 27.4(L)   Platelets 150 - 400 K/uL 46(L) 6(LL) 5(LL)     CMP Latest Ref Rng & Units 03/29/2021 03/25/2021 03/22/2021  Glucose 70 - 99 mg/dL 201(H) 129(H) 173(H)  BUN 8 - 23 mg/dL $Remove'15 16 13  'VeRfQSr$ Creatinine 0.44 - 1.00 mg/dL 0.80 0.65 0.74  Sodium 135 - 145 mmol/L 138 138 140  Potassium 3.5 - 5.1 mmol/L 4.3 4.1 4.1  Chloride 98 - 111 mmol/L 105 106 108  CO2 22 - 32 mmol/L 21(L) 27 24  Calcium 8.9 - 10.3 mg/dL 8.6(L) 8.6(L) 8.6(L)  Total Protein 6.5 - 8.1 g/dL 7.2 7.1 7.0  Total Bilirubin 0.3 - 1.2 mg/dL 0.7 0.7 0.7  Alkaline Phos 38 - 126 U/L 154(H) 133(H) 125  AST 15 - 41 U/L $Remo'16 18 15  'PQPIN$ ALT 0 - 44 U/L $Remo'14 14 14      'oDgKk$ RADIOGRAPHIC STUDIES: I have personally reviewed the radiological images as listed and agreed with the findings in the report. No results found.    No orders of the defined types were placed in this encounter.  All questions were answered. The patient knows to call the clinic with any problems, questions or concerns. No barriers to learning was detected. The total time spent in the appointment was 30 minutes.     Truitt Merle, MD 03/29/2021   I, Wilburn Mylar, am acting as scribe for Truitt Merle, MD.   I have reviewed the above documentation for accuracy and completeness, and I agree with the above.

## 2021-03-29 NOTE — Patient Instructions (Signed)
[facility]  Discharge Instructions: Thank you for choosing [facility] to provide your oncology and hematology care.   If you have a lab appointment with the Cancer Center, please go directly to the Cancer Center and check in at the registration area.   Wear comfortable clothing and clothing appropriate for easy access to any Portacath or PICC line.   We strive to give you quality time with your provider. You may need to reschedule your appointment if you arrive late (15 or more minutes).  Arriving late affects you and other patients whose appointments are after yours.  Also, if you miss three or more appointments without notifying the office, you may be dismissed from the clinic at the provider's discretion.      For prescription refill requests, have your pharmacy contact our office and allow 72 hours for refills to be completed.    Today you received the following chemotherapy and/or immunotherapy agents: azacitidine.     To help prevent nausea and vomiting after your treatment, we encourage you to take your nausea medication as directed.  BELOW ARE SYMPTOMS THAT SHOULD BE REPORTED IMMEDIATELY: . *FEVER GREATER THAN 100.4 F (38 C) OR HIGHER . *CHILLS OR SWEATING . *NAUSEA AND VOMITING THAT IS NOT CONTROLLED WITH YOUR NAUSEA MEDICATION . *UNUSUAL SHORTNESS OF BREATH . *UNUSUAL BRUISING OR BLEEDING . *URINARY PROBLEMS (pain or burning when urinating, or frequent urination) . *BOWEL PROBLEMS (unusual diarrhea, constipation, pain near the anus) . TENDERNESS IN MOUTH AND THROAT WITH OR WITHOUT PRESENCE OF ULCERS (sore throat, sores in mouth, or a toothache) . UNUSUAL RASH, SWELLING OR PAIN  . UNUSUAL VAGINAL DISCHARGE OR ITCHING   Items with * indicate a potential emergency and should be followed up as soon as possible or go to the Emergency Department if any problems should occur.  Please show the CHEMOTHERAPY ALERT CARD or IMMUNOTHERAPY  ALERT CARD at check-in to the Emergency Department and triage nurse.  Should you have questions after your visit or need to cancel or reschedule your appointment, please contact [facility]  Dept: [phone]  and follow the prompts.  Office hours are 8:00 a.m. to 4:30 p.m. Monday - Friday. Please note that voicemails left after 4:00 p.m. may not be returned until the following business day.  We are closed weekends and major holidays. You have access to a nurse at all times for urgent questions. Please call the main number to the clinic Dept: [phone] and follow the prompts.   For any non-urgent questions, you may also contact your provider using MyChart. We now offer e-Visits for anyone 18 and older to request care online for non-urgent symptoms. For details visit [email or web address].   Also download the MyChart app! Go to the app store, search "MyChart", open the app, select , and log in with your MyChart username and password.  Due to Covid, a mask is required upon entering the hospital/clinic. If you do not have a mask, one will be given to you upon arrival. For doctor visits, patients may have 1 support person aged 18 or older with them. For treatment visits, patients cannot have anyone with them due to current Covid guidelines and our immunocompromised population.   

## 2021-03-29 NOTE — Telephone Encounter (Signed)
Pt is requesting all of her OT and PT to be held this week, because a more aggressive chemo treatment. Please give Kathleen Gibson a call at [phone] for a verbal order.

## 2021-03-30 ENCOUNTER — Inpatient Hospital Stay: Payer: Medicare HMO

## 2021-03-30 ENCOUNTER — Telehealth: Payer: Self-pay | Admitting: Hematology

## 2021-03-30 VITALS — BP 121/64 | HR 100 | Temp 98.7°F | Resp 18

## 2021-03-30 DIAGNOSIS — Z7984 Long term (current) use of oral hypoglycemic drugs: Secondary | ICD-10-CM | POA: Diagnosis not present

## 2021-03-30 DIAGNOSIS — I10 Essential (primary) hypertension: Secondary | ICD-10-CM | POA: Diagnosis not present

## 2021-03-30 DIAGNOSIS — C9310 Chronic myelomonocytic leukemia not having achieved remission: Secondary | ICD-10-CM

## 2021-03-30 DIAGNOSIS — Z5111 Encounter for antineoplastic chemotherapy: Secondary | ICD-10-CM | POA: Diagnosis not present

## 2021-03-30 DIAGNOSIS — K5900 Constipation, unspecified: Secondary | ICD-10-CM | POA: Diagnosis not present

## 2021-03-30 DIAGNOSIS — Z452 Encounter for adjustment and management of vascular access device: Secondary | ICD-10-CM

## 2021-03-30 DIAGNOSIS — Z79899 Other long term (current) drug therapy: Secondary | ICD-10-CM | POA: Diagnosis not present

## 2021-03-30 DIAGNOSIS — R519 Headache, unspecified: Secondary | ICD-10-CM | POA: Diagnosis not present

## 2021-03-30 DIAGNOSIS — E1142 Type 2 diabetes mellitus with diabetic polyneuropathy: Secondary | ICD-10-CM | POA: Diagnosis not present

## 2021-03-30 DIAGNOSIS — D693 Immune thrombocytopenic purpura: Secondary | ICD-10-CM | POA: Diagnosis not present

## 2021-03-30 MED ORDER — HEPARIN SOD (PORK) LOCK FLUSH 100 UNIT/ML IV SOLN
250.0000 [IU] | Freq: Once | INTRAVENOUS | Status: AC
  Administered 2021-03-30: 250 [IU] via INTRAVENOUS

## 2021-03-30 MED ORDER — HEPARIN SOD (PORK) LOCK FLUSH 100 UNIT/ML IV SOLN
250.0000 [IU] | Freq: Once | INTRAVENOUS | Status: AC | PRN
  Administered 2021-03-30: 250 [IU]

## 2021-03-30 MED ORDER — SODIUM CHLORIDE 0.9 % IV SOLN
10.0000 mg | Freq: Once | INTRAVENOUS | Status: AC
  Administered 2021-03-30: 10 mg via INTRAVENOUS
  Filled 2021-03-30: qty 10

## 2021-03-30 MED ORDER — SODIUM CHLORIDE 0.9 % IV SOLN
75.0000 mg/m2 | Freq: Once | INTRAVENOUS | Status: AC
  Administered 2021-03-30: 135 mg via INTRAVENOUS
  Filled 2021-03-30: qty 13.5

## 2021-03-30 MED ORDER — HEPARIN SOD (PORK) LOCK FLUSH 100 UNIT/ML IV SOLN: 500.0000 [IU] | Freq: Once | INTRAVENOUS | Status: DC | PRN

## 2021-03-30 MED ORDER — SODIUM CHLORIDE 0.9 % IV SOLN: Freq: Once | INTRAVENOUS | Status: AC

## 2021-03-30 MED ORDER — SODIUM CHLORIDE 0.9% FLUSH
10.0000 mL | INTRAVENOUS | Status: DC | PRN
  Administered 2021-03-30: 10 mL

## 2021-03-30 MED FILL — Dexamethasone Sodium Phosphate Inj 100 MG/10ML: INTRAMUSCULAR | Qty: 1 | Status: AC

## 2021-03-30 NOTE — Telephone Encounter (Signed)
Please advise 

## 2021-03-30 NOTE — Telephone Encounter (Signed)
Left message with follow-up appointments per 10/17 los. 

## 2021-03-30 NOTE — Telephone Encounter (Signed)
Okay with me 

## 2021-03-31 ENCOUNTER — Inpatient Hospital Stay: Payer: Medicare HMO

## 2021-03-31 ENCOUNTER — Other Ambulatory Visit: Payer: Self-pay

## 2021-03-31 VITALS — BP 127/67 | HR 100 | Temp 98.4°F | Resp 18

## 2021-03-31 DIAGNOSIS — Z7984 Long term (current) use of oral hypoglycemic drugs: Secondary | ICD-10-CM | POA: Diagnosis not present

## 2021-03-31 DIAGNOSIS — D693 Immune thrombocytopenic purpura: Secondary | ICD-10-CM

## 2021-03-31 DIAGNOSIS — C9310 Chronic myelomonocytic leukemia not having achieved remission: Secondary | ICD-10-CM | POA: Diagnosis not present

## 2021-03-31 DIAGNOSIS — D696 Thrombocytopenia, unspecified: Secondary | ICD-10-CM

## 2021-03-31 DIAGNOSIS — I10 Essential (primary) hypertension: Secondary | ICD-10-CM | POA: Diagnosis not present

## 2021-03-31 DIAGNOSIS — R519 Headache, unspecified: Secondary | ICD-10-CM | POA: Diagnosis not present

## 2021-03-31 DIAGNOSIS — E1142 Type 2 diabetes mellitus with diabetic polyneuropathy: Secondary | ICD-10-CM | POA: Diagnosis not present

## 2021-03-31 DIAGNOSIS — Z5111 Encounter for antineoplastic chemotherapy: Secondary | ICD-10-CM | POA: Diagnosis not present

## 2021-03-31 DIAGNOSIS — Z79899 Other long term (current) drug therapy: Secondary | ICD-10-CM | POA: Diagnosis not present

## 2021-03-31 DIAGNOSIS — K5900 Constipation, unspecified: Secondary | ICD-10-CM | POA: Diagnosis not present

## 2021-03-31 LAB — CBC WITH DIFFERENTIAL (CANCER CENTER ONLY)
Abs Immature Granulocytes: 0.13 10*3/uL — ABNORMAL HIGH (ref 0.00–0.07)
Basophils Absolute: 0.1 10*3/uL (ref 0.0–0.1)
Basophils Relative: 1 %
Eosinophils Absolute: 0 10*3/uL (ref 0.0–0.5)
Eosinophils Relative: 0 %
HCT: 28.9 % — ABNORMAL LOW (ref 36.0–46.0)
Hemoglobin: 9.1 g/dL — ABNORMAL LOW (ref 12.0–15.0)
Immature Granulocytes: 2 %
Lymphocytes Relative: 11 %
Lymphs Abs: 1 10*3/uL (ref 0.7–4.0)
MCH: 27.1 pg (ref 26.0–34.0)
MCHC: 31.5 g/dL (ref 30.0–36.0)
MCV: 86 fL (ref 80.0–100.0)
Monocytes Absolute: 1.9 10*3/uL — ABNORMAL HIGH (ref 0.1–1.0)
Monocytes Relative: 21 %
Neutro Abs: 5.8 10*3/uL (ref 1.7–7.7)
Neutrophils Relative %: 65 %
Platelet Count: 32 10*3/uL — ABNORMAL LOW (ref 150–400)
RBC: 3.36 MIL/uL — ABNORMAL LOW (ref 3.87–5.11)
RDW: 17.6 % — ABNORMAL HIGH (ref 11.5–15.5)
WBC Count: 8.8 10*3/uL (ref 4.0–10.5)
nRBC: 0 % (ref 0.0–0.2)

## 2021-03-31 LAB — CMP (CANCER CENTER ONLY)
ALT: 14 U/L (ref 0–44)
AST: 14 U/L — ABNORMAL LOW (ref 15–41)
Albumin: 3.5 g/dL (ref 3.5–5.0)
Alkaline Phosphatase: 145 U/L — ABNORMAL HIGH (ref 38–126)
Anion gap: 11 (ref 5–15)
BUN: 17 mg/dL (ref 8–23)
CO2: 21 mmol/L — ABNORMAL LOW (ref 22–32)
Calcium: 9.3 mg/dL (ref 8.9–10.3)
Chloride: 105 mmol/L (ref 98–111)
Creatinine: 0.85 mg/dL (ref 0.44–1.00)
GFR, Estimated: >60 mL/min (ref >60)
Glucose, Bld: 199 mg/dL — ABNORMAL HIGH (ref 70–99)
Potassium: 4 mmol/L (ref 3.5–5.1)
Sodium: 137 mmol/L (ref 135–145)
Total Bilirubin: 0.6 mg/dL (ref 0.3–1.2)
Total Protein: 7.7 g/dL (ref 6.5–8.1)

## 2021-03-31 MED ORDER — SODIUM CHLORIDE 0.9 % IV SOLN: Freq: Once | INTRAVENOUS | Status: AC

## 2021-03-31 MED ORDER — SODIUM CHLORIDE 0.9 % IV SOLN
75.0000 mg/m2 | Freq: Once | INTRAVENOUS | Status: AC
  Administered 2021-03-31: 135 mg via INTRAVENOUS
  Filled 2021-03-31: qty 13.5

## 2021-03-31 MED ORDER — SODIUM CHLORIDE 0.9% FLUSH
10.0000 mL | INTRAVENOUS | Status: DC | PRN
  Administered 2021-03-31: 10 mL

## 2021-03-31 MED ORDER — PALONOSETRON HCL INJECTION 0.25 MG/5ML
0.2500 mg | Freq: Once | INTRAVENOUS | Status: AC
  Administered 2021-03-31: 0.25 mg via INTRAVENOUS
  Filled 2021-03-31: qty 5

## 2021-03-31 MED ORDER — HEPARIN SOD (PORK) LOCK FLUSH 100 UNIT/ML IV SOLN
500.0000 [IU] | Freq: Once | INTRAVENOUS | Status: AC | PRN
  Administered 2021-03-31: 500 [IU]

## 2021-03-31 MED ORDER — SODIUM CHLORIDE 0.9 % IV SOLN
10.0000 mg | Freq: Once | INTRAVENOUS | Status: AC
  Administered 2021-03-31: 10 mg via INTRAVENOUS
  Filled 2021-03-31: qty 10

## 2021-03-31 MED FILL — Dexamethasone Sodium Phosphate Inj 100 MG/10ML: INTRAMUSCULAR | Qty: 1 | Status: AC

## 2021-03-31 NOTE — Patient Instructions (Signed)
[facility]  Discharge Instructions: Thank you for choosing [facility] to provide your oncology and hematology care.   If you have a lab appointment with the Cancer Center, please go directly to the Cancer Center and check in at the registration area.   Wear comfortable clothing and clothing appropriate for easy access to any Portacath or PICC line.   We strive to give you quality time with your provider. You may need to reschedule your appointment if you arrive late (15 or more minutes).  Arriving late affects you and other patients whose appointments are after yours.  Also, if you miss three or more appointments without notifying the office, you may be dismissed from the clinic at the provider's discretion.      For prescription refill requests, have your pharmacy contact our office and allow 72 hours for refills to be completed.    Today you received the following chemotherapy and/or immunotherapy agents vidaza      To help prevent nausea and vomiting after your treatment, we encourage you to take your nausea medication as directed.  BELOW ARE SYMPTOMS THAT SHOULD BE REPORTED IMMEDIATELY: . *FEVER GREATER THAN 100.4 F (38 C) OR HIGHER . *CHILLS OR SWEATING . *NAUSEA AND VOMITING THAT IS NOT CONTROLLED WITH YOUR NAUSEA MEDICATION . *UNUSUAL SHORTNESS OF BREATH . *UNUSUAL BRUISING OR BLEEDING . *URINARY PROBLEMS (pain or burning when urinating, or frequent urination) . *BOWEL PROBLEMS (unusual diarrhea, constipation, pain near the anus) . TENDERNESS IN MOUTH AND THROAT WITH OR WITHOUT PRESENCE OF ULCERS (sore throat, sores in mouth, or a toothache) . UNUSUAL RASH, SWELLING OR PAIN  . UNUSUAL VAGINAL DISCHARGE OR ITCHING   Items with * indicate a potential emergency and should be followed up as soon as possible or go to the Emergency Department if any problems should occur.  Please show the CHEMOTHERAPY ALERT CARD or IMMUNOTHERAPY ALERT CARD  at check-in to the Emergency Department and triage nurse.  Should you have questions after your visit or need to cancel or reschedule your appointment, please contact [facility]  Dept: [phone]  and follow the prompts.  Office hours are 8:00 a.m. to 4:30 p.m. Monday - Friday. Please note that voicemails left after 4:00 p.m. may not be returned until the following business day.  We are closed weekends and major holidays. You have access to a nurse at all times for urgent questions. Please call the main number to the clinic Dept: [phone] and follow the prompts.   For any non-urgent questions, you may also contact your provider using MyChart. We now offer e-Visits for anyone 18 and older to request care online for non-urgent symptoms. For details visit [email or web address].   Also download the MyChart app! Go to the app store, search "MyChart", open the app, select [facility], and log in with your MyChart username and password.  Due to Covid, a mask is required upon entering the hospital/clinic. If you do not have a mask, one will be given to you upon arrival. For doctor visits, patients may have 1 support person aged 18 or older with them. For treatment visits, patients cannot have anyone with them due to current Covid guidelines and our immunocompromised population.   

## 2021-03-31 NOTE — Telephone Encounter (Signed)
V/o given 

## 2021-04-01 ENCOUNTER — Inpatient Hospital Stay: Payer: Medicare HMO

## 2021-04-01 VITALS — BP 129/60 | HR 106 | Temp 97.9°F | Resp 18

## 2021-04-01 DIAGNOSIS — R519 Headache, unspecified: Secondary | ICD-10-CM | POA: Diagnosis not present

## 2021-04-01 DIAGNOSIS — D693 Immune thrombocytopenic purpura: Secondary | ICD-10-CM | POA: Diagnosis not present

## 2021-04-01 DIAGNOSIS — K5900 Constipation, unspecified: Secondary | ICD-10-CM | POA: Diagnosis not present

## 2021-04-01 DIAGNOSIS — C9310 Chronic myelomonocytic leukemia not having achieved remission: Secondary | ICD-10-CM | POA: Diagnosis not present

## 2021-04-01 DIAGNOSIS — Z79899 Other long term (current) drug therapy: Secondary | ICD-10-CM | POA: Diagnosis not present

## 2021-04-01 DIAGNOSIS — Z5111 Encounter for antineoplastic chemotherapy: Secondary | ICD-10-CM | POA: Diagnosis not present

## 2021-04-01 DIAGNOSIS — E1142 Type 2 diabetes mellitus with diabetic polyneuropathy: Secondary | ICD-10-CM | POA: Diagnosis not present

## 2021-04-01 DIAGNOSIS — I10 Essential (primary) hypertension: Secondary | ICD-10-CM | POA: Diagnosis not present

## 2021-04-01 DIAGNOSIS — Z7984 Long term (current) use of oral hypoglycemic drugs: Secondary | ICD-10-CM | POA: Diagnosis not present

## 2021-04-01 MED ORDER — SODIUM CHLORIDE 0.9 % IV SOLN
10.0000 mg | Freq: Once | INTRAVENOUS | Status: AC
  Administered 2021-04-01: 10 mg via INTRAVENOUS
  Filled 2021-04-01: qty 10

## 2021-04-01 MED ORDER — SODIUM CHLORIDE 0.9 % IV SOLN
75.0000 mg/m2 | Freq: Once | INTRAVENOUS | Status: AC
  Administered 2021-04-01: 135 mg via INTRAVENOUS
  Filled 2021-04-01: qty 13.5

## 2021-04-01 MED ORDER — SODIUM CHLORIDE 0.9 % IV SOLN: Freq: Once | INTRAVENOUS | Status: AC

## 2021-04-01 MED ORDER — HEPARIN SOD (PORK) LOCK FLUSH 100 UNIT/ML IV SOLN
250.0000 [IU] | Freq: Once | INTRAVENOUS | Status: AC | PRN
  Administered 2021-04-01: 250 [IU]

## 2021-04-01 MED ORDER — HEPARIN SOD (PORK) LOCK FLUSH 100 UNIT/ML IV SOLN
500.0000 [IU] | Freq: Once | INTRAVENOUS | Status: DC | PRN
Start: 2021-04-01 — End: 2021-04-01

## 2021-04-01 MED ORDER — SODIUM CHLORIDE 0.9% FLUSH: 3.0000 mL | INTRAVENOUS | Status: DC | PRN

## 2021-04-01 MED ORDER — SODIUM CHLORIDE 0.9% FLUSH
10.0000 mL | INTRAVENOUS | Status: DC | PRN
  Administered 2021-04-01: 10 mL

## 2021-04-01 MED FILL — Dexamethasone Sodium Phosphate Inj 100 MG/10ML: INTRAMUSCULAR | Qty: 1 | Status: AC

## 2021-04-01 NOTE — Progress Notes (Signed)
Per Dr Burr Medico, ok to treat with HR 106

## 2021-04-01 NOTE — Patient Instructions (Signed)
[facility]  Discharge Instructions: Thank you for choosing [facility] to provide your oncology and hematology care.   If you have a lab appointment with the Cancer Center, please go directly to the Cancer Center and check in at the registration area.   Wear comfortable clothing and clothing appropriate for easy access to any Portacath or PICC line.   We strive to give you quality time with your provider. You may need to reschedule your appointment if you arrive late (15 or more minutes).  Arriving late affects you and other patients whose appointments are after yours.  Also, if you miss three or more appointments without notifying the office, you may be dismissed from the clinic at the provider's discretion.      For prescription refill requests, have your pharmacy contact our office and allow 72 hours for refills to be completed.   Today you received the following chemotherapy and/or immunotherapy agents Vidaza     To help prevent nausea and vomiting after your treatment, we encourage you to take your nausea medication as directed.  BELOW ARE SYMPTOMS THAT SHOULD BE REPORTED IMMEDIATELY: *FEVER GREATER THAN 100.4 F (38 C) OR HIGHER *CHILLS OR SWEATING *NAUSEA AND VOMITING THAT IS NOT CONTROLLED WITH YOUR NAUSEA MEDICATION *UNUSUAL SHORTNESS OF BREATH *UNUSUAL BRUISING OR BLEEDING *URINARY PROBLEMS (pain or burning when urinating, or frequent urination) *BOWEL PROBLEMS (unusual diarrhea, constipation, pain near the anus) TENDERNESS IN MOUTH AND THROAT WITH OR WITHOUT PRESENCE OF ULCERS (sore throat, sores in mouth, or a toothache) UNUSUAL RASH, SWELLING OR PAIN  UNUSUAL VAGINAL DISCHARGE OR ITCHING   Items with * indicate a potential emergency and should be followed up as soon as possible or go to the Emergency Department if any problems should occur.  Please show the CHEMOTHERAPY ALERT CARD or IMMUNOTHERAPY ALERT CARD at check-in to the  Emergency Department and triage nurse.  Should you have questions after your visit or need to cancel or reschedule your appointment, please contact [facility]  Dept: [phone]  and follow the prompts.  Office hours are 8:00 a.m. to 4:30 p.m. Monday - Friday. Please note that voicemails left after 4:00 p.m. may not be returned until the following business day.  We are closed weekends and major holidays. You have access to a nurse at all times for urgent questions. Please call the main number to the clinic Dept: [phone] and follow the prompts.   For any non-urgent questions, you may also contact your provider using MyChart. We now offer e-Visits for anyone 18 and older to request care online for non-urgent symptoms. For details visit [email or web address].   Also download the MyChart app! Go to the app store, search "MyChart", open the app, select [facility], and log in with your MyChart username and password.  Due to Covid, a mask is required upon entering the hospital/clinic. If you do not have a mask, one will be given to you upon arrival. For doctor visits, patients may have 1 support person aged 18 or older with them. For treatment visits, patients cannot have anyone with them due to current Covid guidelines and our immunocompromised population.   

## 2021-04-02 ENCOUNTER — Other Ambulatory Visit: Payer: Self-pay

## 2021-04-02 ENCOUNTER — Telehealth: Payer: Self-pay | Admitting: *Deleted

## 2021-04-02 ENCOUNTER — Inpatient Hospital Stay: Payer: Medicare HMO

## 2021-04-02 VITALS — BP 131/69 | HR 100 | Temp 98.2°F | Resp 18

## 2021-04-02 DIAGNOSIS — I10 Essential (primary) hypertension: Secondary | ICD-10-CM | POA: Diagnosis not present

## 2021-04-02 DIAGNOSIS — C9310 Chronic myelomonocytic leukemia not having achieved remission: Secondary | ICD-10-CM

## 2021-04-02 DIAGNOSIS — K5900 Constipation, unspecified: Secondary | ICD-10-CM | POA: Diagnosis not present

## 2021-04-02 DIAGNOSIS — Z5111 Encounter for antineoplastic chemotherapy: Secondary | ICD-10-CM | POA: Diagnosis not present

## 2021-04-02 DIAGNOSIS — Z79899 Other long term (current) drug therapy: Secondary | ICD-10-CM | POA: Diagnosis not present

## 2021-04-02 DIAGNOSIS — R519 Headache, unspecified: Secondary | ICD-10-CM | POA: Diagnosis not present

## 2021-04-02 DIAGNOSIS — E1142 Type 2 diabetes mellitus with diabetic polyneuropathy: Secondary | ICD-10-CM | POA: Diagnosis not present

## 2021-04-02 DIAGNOSIS — Z7984 Long term (current) use of oral hypoglycemic drugs: Secondary | ICD-10-CM | POA: Diagnosis not present

## 2021-04-02 DIAGNOSIS — D696 Thrombocytopenia, unspecified: Secondary | ICD-10-CM

## 2021-04-02 DIAGNOSIS — D693 Immune thrombocytopenic purpura: Secondary | ICD-10-CM | POA: Diagnosis not present

## 2021-04-02 LAB — CBC WITH DIFFERENTIAL (CANCER CENTER ONLY)
Abs Immature Granulocytes: 0.34 10*3/uL — ABNORMAL HIGH (ref 0.00–0.07)
Basophils Absolute: 0.1 10*3/uL (ref 0.0–0.1)
Basophils Relative: 1 %
Eosinophils Absolute: 0 10*3/uL (ref 0.0–0.5)
Eosinophils Relative: 0 %
HCT: 27 % — ABNORMAL LOW (ref 36.0–46.0)
Hemoglobin: 8.4 g/dL — ABNORMAL LOW (ref 12.0–15.0)
Immature Granulocytes: 5 %
Lymphocytes Relative: 13 %
Lymphs Abs: 0.9 10*3/uL (ref 0.7–4.0)
MCH: 27 pg (ref 26.0–34.0)
MCHC: 31.1 g/dL (ref 30.0–36.0)
MCV: 86.8 fL (ref 80.0–100.0)
Monocytes Absolute: 1.4 10*3/uL — ABNORMAL HIGH (ref 0.1–1.0)
Monocytes Relative: 20 %
Neutro Abs: 4.3 10*3/uL (ref 1.7–7.7)
Neutrophils Relative %: 61 %
Platelet Count: 31 10*3/uL — ABNORMAL LOW (ref 150–400)
RBC: 3.11 MIL/uL — ABNORMAL LOW (ref 3.87–5.11)
RDW: 17.5 % — ABNORMAL HIGH (ref 11.5–15.5)
WBC Count: 7.1 10*3/uL (ref 4.0–10.5)
nRBC: 0 % (ref 0.0–0.2)

## 2021-04-02 MED ORDER — PALONOSETRON HCL INJECTION 0.25 MG/5ML
0.2500 mg | Freq: Once | INTRAVENOUS | Status: AC
Start: 2021-04-02 — End: 2021-04-02
  Administered 2021-04-02: 0.25 mg via INTRAVENOUS
  Filled 2021-04-02: qty 5

## 2021-04-02 MED ORDER — HEPARIN SOD (PORK) LOCK FLUSH 100 UNIT/ML IV SOLN: 250.0000 [IU] | INTRAVENOUS | Status: DC | PRN

## 2021-04-02 MED ORDER — ALTEPLASE 2 MG IJ SOLR: 2.0000 mg | Freq: Once | INTRAMUSCULAR | Status: DC | PRN

## 2021-04-02 MED ORDER — DEXAMETHASONE SODIUM PHOSPHATE 100 MG/10ML IJ SOLN
10.0000 mg | Freq: Once | INTRAMUSCULAR | Status: AC
  Administered 2021-04-02: 10 mg via INTRAVENOUS
  Filled 2021-04-02: qty 10

## 2021-04-02 MED ORDER — HEPARIN SOD (PORK) LOCK FLUSH 100 UNIT/ML IV SOLN
500.0000 [IU] | Freq: Once | INTRAVENOUS | Status: AC | PRN
  Administered 2021-04-02: 500 [IU]

## 2021-04-02 MED ORDER — SODIUM CHLORIDE 0.9 % IV SOLN
75.0000 mg/m2 | Freq: Once | INTRAVENOUS | Status: AC
  Administered 2021-04-02: 135 mg via INTRAVENOUS
  Filled 2021-04-02: qty 13.5

## 2021-04-02 MED ORDER — SODIUM CHLORIDE 0.9 % IV SOLN: Freq: Once | INTRAVENOUS | Status: AC

## 2021-04-02 MED ORDER — SODIUM CHLORIDE 0.9% FLUSH: 10.0000 mL | INTRAVENOUS | Status: DC | PRN

## 2021-04-02 MED ORDER — SODIUM CHLORIDE 0.9% FLUSH
10.0000 mL | INTRAVENOUS | Status: DC | PRN
  Administered 2021-04-02: 10 mL

## 2021-04-02 NOTE — Patient Instructions (Signed)
[facility]   °Discharge Instructions: °Thank you for choosing [facility] to provide your oncology and hematology care.  ° °If you have a lab appointment with the [facility], please go directly to the [facility] and check in at the registration area. °  °Wear comfortable clothing and clothing appropriate for easy access to any Portacath or PICC line.  ° °We strive to give you quality time with your provider. You may need to reschedule your appointment if you arrive late (15 or more minutes).  Arriving late affects you and other patients whose appointments are after yours.  Also, if you miss three or more appointments without notifying the office, you may be dismissed from the clinic at the provider’s discretion.    °  °For prescription refill requests, have your pharmacy contact our office and allow 72 hours for refills to be completed.   ° °Today you received the following chemotherapy and/or immunotherapy agents: Azacitidine (Vidaza)    °  °To help prevent nausea and vomiting after your treatment, we encourage you to take your nausea medication as directed. ° °BELOW ARE SYMPTOMS THAT SHOULD BE REPORTED IMMEDIATELY: °*FEVER GREATER THAN 100.4 F (38 °C) OR HIGHER °*CHILLS OR SWEATING °*NAUSEA AND VOMITING THAT IS NOT CONTROLLED WITH YOUR NAUSEA MEDICATION °*UNUSUAL SHORTNESS OF BREATH °*UNUSUAL BRUISING OR BLEEDING °*URINARY PROBLEMS (pain or burning when urinating, or frequent urination) °*BOWEL PROBLEMS (unusual diarrhea, constipation, pain near the anus) °TENDERNESS IN MOUTH AND THROAT WITH OR WITHOUT PRESENCE OF ULCERS (sore throat, sores in mouth, or a toothache) °UNUSUAL RASH, SWELLING OR PAIN  °UNUSUAL VAGINAL DISCHARGE OR ITCHING  ° °Items with * indicate a potential emergency and should be followed up as soon as possible or go to the Emergency Department if any problems should occur. ° °Please show the CHEMOTHERAPY ALERT CARD or IMMUNOTHERAPY ALERT CARD at  check-in to the Emergency Department and triage nurse. ° °Should you have questions after your visit or need to cancel or reschedule your appointment, please contact [facility]  Dept: [phone]  and follow the prompts.  Office hours are 8:00 a.m. to 4:30 p.m. Monday - Friday. Please note that voicemails left after 4:00 p.m. may not be returned until the following business day.  We are closed weekends and major holidays. You have access to a nurse at all times for urgent questions. Please call the main number to the clinic Dept: [phone] and follow the prompts. ° ° °For any non-urgent questions, you may also contact your provider using MyChart. We now offer e-Visits for anyone 18 and older to request care online for non-urgent symptoms. For details visit [email or web address]. °  °Also download the MyChart app! Go to the app store, search "MyChart", open the app, select [facility], and log in with your MyChart username and password. ° °Due to Covid, a mask is required upon entering the hospital/clinic. If you do not have a mask, one will be given to you upon arrival. For doctor visits, patients may have 1 support person aged 18 or older with them. For treatment visits, patients cannot have anyone with them due to current Covid guidelines and our immunocompromised population.  ° °

## 2021-04-02 NOTE — Telephone Encounter (Signed)
Per Dr.Feng, OK to proceed with treatment with pt PLT 31 and Hgb 8.4.

## 2021-04-05 ENCOUNTER — Inpatient Hospital Stay: Payer: Medicare HMO

## 2021-04-05 ENCOUNTER — Other Ambulatory Visit: Payer: Self-pay

## 2021-04-05 VITALS — BP 108/59 | HR 83 | Temp 98.2°F | Resp 16 | Ht 63.0 in | Wt 147.8 lb

## 2021-04-05 DIAGNOSIS — D693 Immune thrombocytopenic purpura: Secondary | ICD-10-CM | POA: Diagnosis not present

## 2021-04-05 DIAGNOSIS — E1142 Type 2 diabetes mellitus with diabetic polyneuropathy: Secondary | ICD-10-CM | POA: Diagnosis not present

## 2021-04-05 DIAGNOSIS — K5900 Constipation, unspecified: Secondary | ICD-10-CM | POA: Diagnosis not present

## 2021-04-05 DIAGNOSIS — I10 Essential (primary) hypertension: Secondary | ICD-10-CM | POA: Diagnosis not present

## 2021-04-05 DIAGNOSIS — Z7984 Long term (current) use of oral hypoglycemic drugs: Secondary | ICD-10-CM | POA: Diagnosis not present

## 2021-04-05 DIAGNOSIS — R519 Headache, unspecified: Secondary | ICD-10-CM | POA: Diagnosis not present

## 2021-04-05 DIAGNOSIS — Z79899 Other long term (current) drug therapy: Secondary | ICD-10-CM | POA: Diagnosis not present

## 2021-04-05 DIAGNOSIS — C9310 Chronic myelomonocytic leukemia not having achieved remission: Secondary | ICD-10-CM | POA: Diagnosis not present

## 2021-04-05 DIAGNOSIS — Z5111 Encounter for antineoplastic chemotherapy: Secondary | ICD-10-CM | POA: Diagnosis not present

## 2021-04-05 DIAGNOSIS — D696 Thrombocytopenia, unspecified: Secondary | ICD-10-CM

## 2021-04-05 DIAGNOSIS — Z452 Encounter for adjustment and management of vascular access device: Secondary | ICD-10-CM

## 2021-04-05 DIAGNOSIS — D469 Myelodysplastic syndrome, unspecified: Secondary | ICD-10-CM

## 2021-04-05 LAB — CBC WITH DIFFERENTIAL (CANCER CENTER ONLY)
Abs Immature Granulocytes: 1 10*3/uL — ABNORMAL HIGH (ref 0.00–0.07)
Basophils Absolute: 0.1 10*3/uL (ref 0.0–0.1)
Basophils Relative: 1 %
Eosinophils Absolute: 0.1 10*3/uL (ref 0.0–0.5)
Eosinophils Relative: 1 %
HCT: 29.9 % — ABNORMAL LOW (ref 36.0–46.0)
Hemoglobin: 9.4 g/dL — ABNORMAL LOW (ref 12.0–15.0)
Immature Granulocytes: 10 %
Lymphocytes Relative: 9 %
Lymphs Abs: 0.9 10*3/uL (ref 0.7–4.0)
MCH: 26.9 pg (ref 26.0–34.0)
MCHC: 31.4 g/dL (ref 30.0–36.0)
MCV: 85.4 fL (ref 80.0–100.0)
Monocytes Absolute: 1.2 10*3/uL — ABNORMAL HIGH (ref 0.1–1.0)
Monocytes Relative: 11 %
Neutro Abs: 6.9 10*3/uL (ref 1.7–7.7)
Neutrophils Relative %: 68 %
Platelet Count: 46 10*3/uL — ABNORMAL LOW (ref 150–400)
RBC: 3.5 MIL/uL — ABNORMAL LOW (ref 3.87–5.11)
RDW: 17.3 % — ABNORMAL HIGH (ref 11.5–15.5)
WBC Count: 10.2 10*3/uL (ref 4.0–10.5)
nRBC: 0 % (ref 0.0–0.2)

## 2021-04-05 LAB — CMP (CANCER CENTER ONLY)
ALT: 25 U/L (ref 0–44)
AST: 26 U/L (ref 15–41)
Albumin: 3.3 g/dL — ABNORMAL LOW (ref 3.5–5.0)
Alkaline Phosphatase: 125 U/L (ref 38–126)
Anion gap: 6 (ref 5–15)
BUN: 16 mg/dL (ref 8–23)
CO2: 26 mmol/L (ref 22–32)
Calcium: 8.5 mg/dL — ABNORMAL LOW (ref 8.9–10.3)
Chloride: 105 mmol/L (ref 98–111)
Creatinine: 0.68 mg/dL (ref 0.44–1.00)
GFR, Estimated: >60 mL/min (ref >60)
Glucose, Bld: 237 mg/dL — ABNORMAL HIGH (ref 70–99)
Potassium: 3.8 mmol/L (ref 3.5–5.1)
Sodium: 137 mmol/L (ref 135–145)
Total Bilirubin: 0.9 mg/dL (ref 0.3–1.2)
Total Protein: 6.9 g/dL (ref 6.5–8.1)

## 2021-04-05 MED ORDER — ROMIPLOSTIM INJECTION 500 MCG
700.0000 ug | Freq: Once | SUBCUTANEOUS | Status: AC
  Administered 2021-04-05: 700 ug via SUBCUTANEOUS
  Filled 2021-04-05: qty 0.5

## 2021-04-05 NOTE — Patient Instructions (Signed)
Romiplostim injection What is this medication? ROMIPLOSTIM (roe mi PLOE stim) helps your body make more platelets. This medicine is used to treat low platelets caused by chronic idiopathic thrombocytopenic purpura (ITP) or a bone marrow syndrome caused by radiation sickness. This medicine may be used for other purposes; ask your health care provider or pharmacist if you have questions. COMMON BRAND NAME(S): Nplate What should I tell my care team before I take this medication? They need to know if you have any of these conditions: blood clots myelodysplastic syndrome an unusual or allergic reaction to romiplostim, mannitol, other medicines, foods, dyes, or preservatives pregnant or trying to get pregnant breast-feeding How should I use this medication? This medicine is injected under the skin. It is given by a health care provider in a hospital or clinic setting. A special MedGuide will be given to you before each treatment. Be sure to read this information carefully each time. Talk to your health care provider about the use of this medicine in children. While it may be prescribed for children as young as newborns for selected conditions, precautions do apply. Overdosage: If you think you have taken too much of this medicine contact a poison control center or emergency room at once. NOTE: This medicine is only for you. Do not share this medicine with others. What if I miss a dose? Keep appointments for follow-up doses. It is important not to miss your dose. Call your health care provider if you are unable to keep an appointment. What may interact with this medication? Interactions are not expected. This list may not describe all possible interactions. Give your health care provider a list of all the medicines, herbs, non-prescription drugs, or dietary supplements you use. Also tell them if you smoke, drink alcohol, or use illegal drugs. Some items may interact with your medicine. What should I  watch for while using this medication? Visit your health care provider for regular checks on your progress. You may need blood work done while you are taking this medicine. Your condition will be monitored carefully while you are receiving this medicine. It is important not to miss any appointments. What side effects may I notice from receiving this medication? Side effects that you should report to your doctor or health care professional as soon as possible: allergic reactions (skin rash, itching or hives; swelling of the face, lips, or tongue) bleeding (bloody or black, tarry stools; red or dark brown urine; spitting up blood or brown material that looks like coffee grounds; red spots on the skin; unusual bruising or bleeding from the eyes, gums, or nose) blood clot (chest pain; shortness of breath; pain, swelling, or warmth in the leg) stroke (changes in vision; confusion; trouble speaking or understanding; severe headaches; sudden numbness or weakness of the face, arm or leg; trouble walking; dizziness; loss of balance or coordination) Side effects that usually do not require medical attention (report to your doctor or health care professional if they continue or are bothersome): diarrhea dizziness headache joint pain muscle pain stomach pain trouble sleeping This list may not describe all possible side effects. Call your doctor for medical advice about side effects. You may report side effects to FDA at 1-800-FDA-1088. Where should I keep my medication? This medicine is given in a hospital or clinic. It will not be stored at home. NOTE: This sheet is a summary. It may not cover all possible information. If you have questions about this medicine, talk to your doctor, pharmacist, or health care provider.    2022 Elsevier/Gold Standard (2019-07-15 10:28:13)  

## 2021-04-05 NOTE — Progress Notes (Signed)
Pt. platelets 46 and Hemoglobin 9.4 today and Per Dr. [facility] notes, no transfusion required.

## 2021-04-05 NOTE — Patient Instructions (Signed)

## 2021-04-06 ENCOUNTER — Telehealth: Payer: Self-pay

## 2021-04-06 NOTE — Telephone Encounter (Signed)
Completed and returned to C[facility] work basket ?

## 2021-04-06 NOTE — Telephone Encounter (Signed)
Home health orders received 04/06/21 for [facility] initiation orders: No.  Home health re-certification orders: Yes. Patient last seen by ordering physician for this condition: 03/26/21. Must be less than 90 days for re-certification and less than 30 days prior for initiation. Visit must have been for the condition the orders are being placed.  Patient meets criteria for Physician to sign orders: Yes.        Current med list has been attached: No        Orders placed on physicians desk for signature: 04/06/21 (date) If patient does not meet criteria for orders to be signed: pt was called to schedule appt. Appt is scheduled for n/a.   [facility]

## 2021-04-07 ENCOUNTER — Other Ambulatory Visit: Payer: Self-pay

## 2021-04-07 DIAGNOSIS — D469 Myelodysplastic syndrome, unspecified: Secondary | ICD-10-CM

## 2021-04-07 NOTE — Telephone Encounter (Signed)
faxed

## 2021-04-08 ENCOUNTER — Inpatient Hospital Stay: Payer: Medicare HMO

## 2021-04-08 ENCOUNTER — Other Ambulatory Visit: Payer: Self-pay

## 2021-04-08 VITALS — BP 112/58 | HR 91 | Temp 98.8°F | Resp 16

## 2021-04-08 DIAGNOSIS — I10 Essential (primary) hypertension: Secondary | ICD-10-CM | POA: Diagnosis not present

## 2021-04-08 DIAGNOSIS — C9310 Chronic myelomonocytic leukemia not having achieved remission: Secondary | ICD-10-CM | POA: Diagnosis not present

## 2021-04-08 DIAGNOSIS — D693 Immune thrombocytopenic purpura: Secondary | ICD-10-CM | POA: Diagnosis not present

## 2021-04-08 DIAGNOSIS — K5900 Constipation, unspecified: Secondary | ICD-10-CM | POA: Diagnosis not present

## 2021-04-08 DIAGNOSIS — Z7984 Long term (current) use of oral hypoglycemic drugs: Secondary | ICD-10-CM | POA: Diagnosis not present

## 2021-04-08 DIAGNOSIS — Z452 Encounter for adjustment and management of vascular access device: Secondary | ICD-10-CM

## 2021-04-08 DIAGNOSIS — D696 Thrombocytopenia, unspecified: Secondary | ICD-10-CM

## 2021-04-08 DIAGNOSIS — R519 Headache, unspecified: Secondary | ICD-10-CM | POA: Diagnosis not present

## 2021-04-08 DIAGNOSIS — E1142 Type 2 diabetes mellitus with diabetic polyneuropathy: Secondary | ICD-10-CM | POA: Diagnosis not present

## 2021-04-08 DIAGNOSIS — Z5111 Encounter for antineoplastic chemotherapy: Secondary | ICD-10-CM | POA: Diagnosis not present

## 2021-04-08 DIAGNOSIS — Z79899 Other long term (current) drug therapy: Secondary | ICD-10-CM | POA: Diagnosis not present

## 2021-04-08 LAB — CMP (CANCER CENTER ONLY)
ALT: 20 U/L (ref 0–44)
AST: 19 U/L (ref 15–41)
Albumin: 3.3 g/dL — ABNORMAL LOW (ref 3.5–5.0)
Alkaline Phosphatase: 144 U/L — ABNORMAL HIGH (ref 38–126)
Anion gap: 8 (ref 5–15)
BUN: 14 mg/dL (ref 8–23)
CO2: 25 mmol/L (ref 22–32)
Calcium: 8.7 mg/dL — ABNORMAL LOW (ref 8.9–10.3)
Chloride: 105 mmol/L (ref 98–111)
Creatinine: 0.76 mg/dL (ref 0.44–1.00)
GFR, Estimated: >60 mL/min (ref >60)
Glucose, Bld: 163 mg/dL — ABNORMAL HIGH (ref 70–99)
Potassium: 4.5 mmol/L (ref 3.5–5.1)
Sodium: 138 mmol/L (ref 135–145)
Total Bilirubin: 0.7 mg/dL (ref 0.3–1.2)
Total Protein: 6.8 g/dL (ref 6.5–8.1)

## 2021-04-08 LAB — CBC WITH DIFFERENTIAL (CANCER CENTER ONLY)
Abs Immature Granulocytes: 1.65 10*3/uL — ABNORMAL HIGH (ref 0.00–0.07)
Basophils Absolute: 0.1 10*3/uL (ref 0.0–0.1)
Basophils Relative: 1 %
Eosinophils Absolute: 0.1 10*3/uL (ref 0.0–0.5)
Eosinophils Relative: 1 %
HCT: 29.5 % — ABNORMAL LOW (ref 36.0–46.0)
Hemoglobin: 9.2 g/dL — ABNORMAL LOW (ref 12.0–15.0)
Immature Granulocytes: 11 %
Lymphocytes Relative: 7 %
Lymphs Abs: 1.1 10*3/uL (ref 0.7–4.0)
MCH: 26.9 pg (ref 26.0–34.0)
MCHC: 31.2 g/dL (ref 30.0–36.0)
MCV: 86.3 fL (ref 80.0–100.0)
Monocytes Absolute: 1.3 10*3/uL — ABNORMAL HIGH (ref 0.1–1.0)
Monocytes Relative: 9 %
Neutro Abs: 10.9 10*3/uL — ABNORMAL HIGH (ref 1.7–7.7)
Neutrophils Relative %: 71 %
Platelet Count: 36 10*3/uL — ABNORMAL LOW (ref 150–400)
RBC: 3.42 MIL/uL — ABNORMAL LOW (ref 3.87–5.11)
RDW: 17.5 % — ABNORMAL HIGH (ref 11.5–15.5)
WBC Count: 15.1 10*3/uL — ABNORMAL HIGH (ref 4.0–10.5)
nRBC: 0 % (ref 0.0–0.2)

## 2021-04-08 MED ORDER — SODIUM CHLORIDE 0.9% FLUSH
10.0000 mL | Freq: Once | INTRAVENOUS | Status: AC
  Administered 2021-04-08: 10 mL via INTRAVENOUS

## 2021-04-08 MED ORDER — SODIUM CHLORIDE 0.9% FLUSH
10.0000 mL | INTRAVENOUS | Status: AC | PRN
  Administered 2021-04-08: 10 mL

## 2021-04-08 MED ORDER — HEPARIN SOD (PORK) LOCK FLUSH 100 UNIT/ML IV SOLN: 250.0000 [IU] | INTRAVENOUS | Status: DC | PRN

## 2021-04-08 MED ORDER — HEPARIN SOD (PORK) LOCK FLUSH 100 UNIT/ML IV SOLN
500.0000 [IU] | Freq: Once | INTRAVENOUS | Status: AC
  Administered 2021-04-08: 500 [IU] via INTRAVENOUS

## 2021-04-08 MED ORDER — ACETAMINOPHEN 325 MG PO TABS
650.0000 mg | ORAL_TABLET | Freq: Once | ORAL | Status: AC
  Administered 2021-04-08: 650 mg via ORAL
  Filled 2021-04-08: qty 2

## 2021-04-08 NOTE — Progress Notes (Signed)
Platelets 36K, no need for platelet transfusion today per Dr. [facility]. Upon leaving infusion room, patient stated her back pain is improving due to medication kicking in.

## 2021-04-09 DIAGNOSIS — D696 Thrombocytopenia, unspecified: Secondary | ICD-10-CM | POA: Diagnosis not present

## 2021-04-09 DIAGNOSIS — X58XXXA Exposure to other specified factors, initial encounter: Secondary | ICD-10-CM | POA: Diagnosis not present

## 2021-04-09 DIAGNOSIS — S22060A Wedge compression fracture of T7-T8 vertebra, initial encounter for closed fracture: Secondary | ICD-10-CM | POA: Diagnosis not present

## 2021-04-11 ENCOUNTER — Other Ambulatory Visit: Payer: Self-pay | Admitting: Nurse Practitioner

## 2021-04-11 DIAGNOSIS — D696 Thrombocytopenia, unspecified: Secondary | ICD-10-CM

## 2021-04-11 NOTE — Progress Notes (Signed)
Agency   [phone] [phone]   Clinic Follow up Note   Patient Care Team: [name], DO as PCP - General (Family Medicine) Loletha Carrow Kirke Corin, MD as Consulting Physician (Gastroenterology) Truitt Merle, MD as Consulting Physician (Hematology) Alda Berthold, DO as Consulting Physician (Neurology) 04/12/2021  CHIEF COMPLAINT: Follow up [name], severe thrombocytopenia  SUMMARY OF ONCOLOGIC HISTORY: Oncology History  [name] (chronic myelomonocytic leukemia) ([place])  10/29/2020 Imaging   CT CAP  IMPRESSION: 1. Splenomegaly with pathologically enlarged lymph nodes above and below the diaphragm, with overall stable to minimally increased abdominal adenopathy and interval progression of the pelvic adenopathy. 2. Small volume abdominopelvic ascites with diffuse mesenteric stranding. 3. Scattered bilateral pulmonary micro nodules measuring 1-2 mm. 4. Distended gallbladder with some layering hyperdense material representing layering sludge and tiny stones seen on prior ultrasound. 5. Aortic atherosclerosis.   10/30/2020 Pathology Results   DIAGNOSIS:   BONE MARROW, ASPIRATE, CLOT, CORE:  -  Hypercellular bone marrow with panhyperplasia, atypia, and no  increase in blasts  -  See comment   PERIPHERAL BLOOD:  -  Marked thrombocytopenia  -  Absolute monocytosis  -  Normocytic anemia  -  See CBC data and comment   COMMENT:  The bone marrow is hypercellular for age (approximately 80%) with myeloid hyperplasia with maturational left shift, erythroid hyperplasia, and increased megakaryocytes.  Mild multilineage atypia is present. Blasts are not increased on aspirate smears (1% by manual differential count) or by CD34 immunohistochemistry on the core biopsy.  Concurrent flow cytometric analysis of the bone marrow aspirate demonstrates increased monocytes, and no increase in blasts or abnormal lymphoid population (see [id number]).  Monocytes are also  increased in peripheral  blood, persistent per electronic medical record.  In aggregate, the  findings raise the possibility of a myeloid neoplasm with the  differential diagnosis including a low-grade myelodysplastic syndrome and chronic myelomonocytic leukemia (dysplastic type).    ADDENDUM:  A reticulin special stain performed on the bone marrow core biopsy reveals no significant increase in reticulin fibrosis.  ADDENDUM:  CYTOGENETIC RESULTS:  Karyotype: 46,XX[20]  Interpretation: NORMAL FEMALE KARYOTYPE   FISH RESULTS:  Results: NORMAL   ADDENDUM:  CD123 immunohistochemistry performed on the core biopsy highlights scattered aggregates of positively staining cells consistent with plasmacytoid dendritic cells.    10/30/2020 Pathology Results   DIAGNOSIS:   BONE MARROW; FLOW CYTOMETRIC ANALYSIS:  -  Increased monocytes  -  Scant B-cells present  -  No immunophenotypically aberrant T-cell population identified  -  No increase in blasts  -  See comment   COMMENT:  Monocytes are relatively increased (12% of all cells), without aberrant expression of CD56.  B-cells comprise <1% of total lymphocytes. CD34-positive blasts are not increased (<1% of all cells).  Correlation with concurrent morphology is recommended for complete diagnostic interpretation and overall blast enumeration (see [id number]).    10/30/2020 Pathology Results   FINAL MICROSCOPIC DIAGNOSIS:   A. LYMPH NODE, RIGHT AXILLARY, NEEDLE CORE BIOPSY:  -Lymphoid tissue present  -See comment   COMMENT:  The sections show several small needle core biopsy fragments of lymphoid tissue displaying degenerative cellular changes/necrosis and hence cannot be accurately evaluated.  Sample for flow cytometric analysis not available.  Immunohistochemical stain for CD3 and CD20 were performed with appropriate controls.  There is a mixture of T and B cells in their apparently respective compartments.  There is no definite metastatic  malignancy.    11/11/2020 Pathology Results  DIAGNOSIS:   LEFT AXILLARY LYMPH NODE EXCISIONAL BIOPSY; FLOW CYTOMETRIC ANALYSIS:  -  No monotypic B-cell or immunophenotypically aberrant T-cell  population identified  -  See comment   COMMENT:  Flow cytometric analysis identifies B-cells with a normal kappa:lambda ratio of 1.7:1.  A subset of the polytypic B-cells expresses CD10. T-cells show a CD4:CD8 ratio of 3.3:1 without immunophenotypic aberrancy with the markers evaluated.  Although these results do not support the diagnosis of a clonal lymphoid population, sampling issues must always be considered when negative results are obtained, as focal lesions may not be represented in the specimen submitted.  In addition, flow cytometric immunophenotyping will not exclude other pathology if present (e.g. Hodgkin lymphoma, some T-cell lymphomas, metastatic and infectious diseases).   11/11/2020 Pathology Results   FINAL MICROSCOPIC DIAGNOSIS:   A. LYMPH NODE, LEFT AXILLARY, DISSECTION:  -  Follicular hyperplasia with interfollicular expansion  -  See comment   COMMENT:  Sections of the lymph nodes reveal generally preserved lymph node architecture.  There is follicular hyperplasia with some follicles showing increased hyalinization of germinal centers and mild concentric encircling of germinal centers with small lymphocytes (onion skinning). Interfollicular areas are expanded with increased vascular proliferation, small lymphocytes, plasma cells, histiocytic cells, and patchy neutrophils.  The lymph node capsule is variably thickened by fibrosis with few plasma cells.   A panel of immunohistochemical stains is performed for further  characterization.  CD3 and CD20/PAX5 highlight T-cell and B-cell  compartments, respectively.  Germinal centers are highlighted by CD10 and BCL6 with appropriate absent expression of BCL2.  CD5 is similar to CD3.  CD43 also stains the T-cells.  CD4-positive T-cells exceed  CD8-positive T-cells.  CD30 highlights scattered immunoblasts.  CD21 reveals intact follicular dendritic cell meshworks.  CD68 highlights increased histiocytic cells.  HHV 8 is negative.  T. pallidum reveals no definitive organisms.  Pancytokeratin is negative.  TdT stains rare cells.  CD138 highlights plasma cells which are not increased in number and show polytypic light chain expression by kappa/lambda in situ  hybridization.  EBV is negative by in situ hybridization.   Concurrent flow cytometric analysis is negative for a monoclonal B-cell or immunophenotypically aberrant T-cell population (see [id number]).   Together, the findings above are non-specific and can be seen in  reactive and infectious processes as well as Castleman disease.  There is no definitive morphologic or flow cytometric evidence of involvement by a lymphoproliferative disorder from the current workup.   12/17/2020 Initial Diagnosis   [name] (chronic myelomonocytic leukemia) ([place])   12/28/2020 -  Chemotherapy   Patient is on Treatment Plan : MYELODYSPLASIA  Azacitidine IV D1-5 q28d     03/18/2021 Pathology Results   Final Diagnosis    BONE MARROW:             Persistent chronic myelomonocytic leukemia in a hypercellular bone marrow with increased monocytes and no increase in blasts.   Comment    Flow cytometric analysis showed no increase in blasts and 17% monocytes.A next generation myeloid panel showed the presence of the following mutations: NRAS, SH2B3, PHF6 and TET2. CD34 and CD117 immunstains do not show an increase in blasts. The findings are consistent with persistent [name] with a decrease in the number of blasts compared to that seen on the previous bone marrow.    Final Interpretation      BONE MARROW; FLOW CYTOMETRIC ANALYSIS:   No increased blasts identified. (see comment)  Monocytosis (17% of total events).    COMMENT:  Flow cytometry identified about 0.6% of total events as immature cells. These cells  express CD45 (dim), CD34, CD117, HLA-DR, CD38, CD33 (variable). This immunophenotype is consistent with normal myeloblasts. In addition, monocytes are increased and comprise of approximately 17% of total events. These monocytes show normal expression of CD33/CD64/CD14/CD11b with variable expression of CD4. Correlation with concurrent morphology and clinical data is recommended (see [id number]).    FLOW CYTOMETRY ANALYSIS: CD45 versus side scatter analysis demonstrate a predominance of granulocytes (~70%), monocytes (~17%) and lymphocytes (~6%). No significant increase in blasts identified. All tested markers were used for adequate analysis of the cells and were appropriately reviewed.       CURRENT THERAPY:  Azacitidine days 1-5 q28 days, started 12/28/20 Nplate 35mcg/kg weekly  Platelet transfusion as needed (plt<15K)  INTERVAL HISTORY: Ms. Cerritos returns for follow up and platelets/Nplate as scheduled.  She was last seen 03/29/21 and completed cycle 4 azacitidine for 5 days. She was evaluated by neurosurgery and tentatively plans thoracic kyphoplasty next week, platelet goal is greater than 50.  She continues to have severe back pain, oxycodone 4 times daily is effective.  She had a panic attack 2 weeks ago, was up for 2 days and could not eat or sleep.  She lost weight.  She did eventually recover with Ativan and the support of family.  She notes that she was afraid but not sure of what.  She is better now, gaining weight.  Bowels moving well, denies bleeding.  She got a bruise on the left leg during her panic episode, not sure how but denies fall.  Denies fever, chills, cough, chest pain, dyspnea, mucositis, rash, or any other new concerns.   MEDICAL HISTORY:  Past Medical History:  Diagnosis Date   Diabetes ([place])    HLD (hyperlipidemia)    Hypertension    Pernicious anemia    Pneumonia 09/05/2019   Renal insufficiency 09/06/2019   Vitamin D deficiency     SURGICAL HISTORY: Past  Surgical History:  Procedure Laterality Date   ABDOMINAL HERNIA REPAIR  2009   ABDOMINOPLASTY     AXILLARY LYMPH NODE BIOPSY Left 11/11/2020   Procedure: LEFT AXILLARY EXCISIONAL LYMPH NODE BIOPSY;  Surgeon: [name], MD;  Location: [facility];  Service: General;  Laterality: Left;   [place] [place]   hemorroid surgery  2007   [place]    I have reviewed the social history and family history with the patient and they are unchanged from previous note.  ALLERGIES:  is allergic to asa [aspirin] and nsaids.  MEDICATIONS:  Current Outpatient Medications  Medication Sig Dispense Refill   LORazepam (ATIVAN) 1 MG tablet Take 1 mg by mouth at bedtime as needed.     acetaminophen (TYLENOL) 500 MG tablet Take 2 tablets (1,000 mg total) by mouth every 8 (eight) hours. 30 tablet 0   Acetaminophen-Codeine 300-30 MG tablet Take 1 tablet by mouth as needed.     bisacodyl (DULCOLAX) 10 MG suppository Place 1 suppository (10 mg total) rectally daily as needed for moderate constipation (Please take-if no bowel movement in 1-2 days with MiraLAX/senna). 12 suppository 0   cyclobenzaprine (FLEXERIL) 10 MG tablet Take 0.5-1 tablets (5-10 mg total) by mouth 3 (three) times daily as needed for muscle spasms. 90 tablet 2   gabapentin (NEURONTIN) 300 MG capsule Take 2 capsules (600 mg total) by mouth 2 (two) times daily. 360 capsule 1   metoprolol succinate (TOPROL-XL)  25 MG 24 hr tablet Take 1 tablet (25 mg total) by mouth daily. 90 tablet 1   naloxone (NARCAN) nasal spray 4 mg/0.1 mL Place 1 spray into the nose once as needed (overdose). 2 each 0   Oxycodone HCl 10 MG TABS Take 1 tablet (10 mg total) by mouth in the morning, at noon, in the evening, and at bedtime. 120 tablet 0   polyethylene glycol powder (GLYCOLAX/MIRALAX) 17 GM/SCOOP powder Dissolve 1 capful (17 g) in water and drink 2 (two) times daily. 510 g 2   senna-docusate (SENOKOT-S) 8.6-50 MG  tablet Take 2 tablets by mouth at bedtime. 60 tablet 2   sitaGLIPtin-metformin (JANUMET) 50-1000 MG tablet Take 1 tablet by mouth at bedtime. 90 tablet 1   zolpidem (AMBIEN) 5 MG tablet Take 5 mg by mouth at bedtime. (Patient not taking: Reported on 03/30/2021)     No current facility-administered medications for this visit.    PHYSICAL EXAMINATION: ECOG PERFORMANCE STATUS: 1 - Symptomatic but completely ambulatory  Vitals:   04/12/21 0907  BP: (!) 114/59  Pulse: 90  Resp: 17  Temp: 98.2 F (36.8 C)  SpO2: 98%   Filed Weights   04/12/21 0907  Weight: 146 lb 14.4 oz (66.6 kg)    GENERAL:alert, no distress and comfortable SKIN: Scattered ecchymoses over the forearms EYES: sclera clear LUNGS: clear with normal breathing effort HEART: regular rate & rhythm, no lower extremity edema NEURO: alert & oriented x 3 with fluent speech, no focal motor/sensory deficits PICC without bleeding or erythema  LABORATORY DATA:  I have reviewed the data as listed CBC Latest Ref Rng & Units 04/12/2021 04/08/2021 04/05/2021  WBC 4.0 - 10.5 K/uL 15.7(H) 15.1(H) 10.2  Hemoglobin 12.0 - 15.0 g/dL 8.8(L) 9.2(L) 9.4(L)  Hematocrit 36.0 - 46.0 % 28.4(L) 29.5(L) 29.9(L)  Platelets 150 - 400 K/uL 46(L) 36(L) 46(L)     CMP Latest Ref Rng & Units 04/12/2021 04/08/2021 04/05/2021  Glucose 70 - 99 mg/dL 202(H) 163(H) 237(H)  BUN 8 - 23 mg/dL $Remove'15 14 16  'XoAZFtb$ Creatinine 0.44 - 1.00 mg/dL 0.76 0.76 0.68  Sodium 135 - 145 mmol/L 138 138 137  Potassium 3.5 - 5.1 mmol/L 4.2 4.5 3.8  Chloride 98 - 111 mmol/L 105 105 105  CO2 22 - 32 mmol/L $RemoveB'26 25 26  'laRJzxzV$ Calcium 8.9 - 10.3 mg/dL 8.3(L) 8.7(L) 8.5(L)  Total Protein 6.5 - 8.1 g/dL 6.7 6.8 6.9  Total Bilirubin 0.3 - 1.2 mg/dL 0.6 0.7 0.9  Alkaline Phos 38 - 126 U/L 162(H) 144(H) 125  AST 15 - 41 U/L $Remo'19 19 26  'FHivA$ ALT 0 - 44 U/L $Remo'20 20 25      'vFwVS$ RADIOGRAPHIC STUDIES: I have personally reviewed the radiological images as listed and agreed with the findings in the  report. No results found.   ASSESSMENT & PLAN: Kathleen Gibson is a 70 y.o. female with   1. [name] with severe thrombocytopenia  -Initially diagnosed with ITP in 2014. She lost follow up after 11/2017 and did not proceed with recommended bone marrow biopsy. -She was referred to ED 10/28/20 by her PCP for low plt count of 4k. She was hospitalized and treated with platelet transfusions, IVIG, oral prednisone $RemoveBeforeD'60mg'CXDynBHKPkLcpH$  and Nplate injections. She tapered off prednisone given little response. -Bone marrow biopsy 10/30/20 felt to likely represent MDS/MPN, particularly [name].  Confirmed on slide review at [facility] by Dr. Linus Orn -She began treatment for severe refractory thrombocytopenia with weekly Rituxan x4 on 11/19/20, she did not  respond  -She began azacitidine daily days 1-5 q. 28 days, s/p cycle 1 she tolerated well with mild fatigue and stable headache -She had worsening thrombocytopenia after stopping Nplate, restarted weekly Nplate 10 mcg/kg on 8/41/2820 -Bone marrow biopsy wait for CD 03/18/2021 showed stable disease, no increase in blasts. -S/p cycle 4 azacitidine on 03/29/2021, tolerated well -She has become less dependent on platelet transfusions this month  2.  T7/T8 compression fracture -Pending kyphoplasty with Dr. Hulan Saas of neurosurgery at [place] next week as long as platelets greater than 50K -In a thoracic brace today, she has persistent pain but adequately managed on oxycodone 4 times daily  3.  Recent panic attack -Managed with family support and lorazepam as needed per Dr. Morey Hummingbird  -Recovered, no recurrent episodes -For emotional support and listening  4. Normocytic anemia, moderate  -H/o pernicious anemia/vitamin B12 deficiency -Transfuse for Hgb </= 7.5 -Continue oral iron    5. Type 2 diabetes mellitus with polyneuropathy, Hypertension -Continue medication, follow-up PCP   6. Headache -Etiology unknown, managed with Tylenol -Stable -We will  get imaging if headaches progress -Not discussed today  Disposition: Ms. Popko appears stable. S/p cycle 4 azacitidine, she tolerates treatment well overall.  Labs reviewed, platelet 46K, Hgb 8.8.  She is less platelet transfusion dependent recently.  She will receive Nplate today as planned, continue weekly.  She will return for lab and transfusions if needed on 11/3, then Monday/Thursday until next cycle.  Follow-up 11/15.  For thoracic back pain, continue pain management and supportive care.  Kyphoplasty per Dr. Owens Shark at [facility] 04/20/2021.  We will give platelet transfusion if needed for goal > 50K.  All questions were answered. The patient knows to call the clinic with any problems, questions or concerns. No barriers to learning were detected.      Alla Feeling, NP 04/12/21

## 2021-04-12 ENCOUNTER — Other Ambulatory Visit: Payer: Self-pay

## 2021-04-12 ENCOUNTER — Inpatient Hospital Stay (HOSPITAL_BASED_OUTPATIENT_CLINIC_OR_DEPARTMENT_OTHER): Payer: Medicare HMO | Admitting: Nurse Practitioner

## 2021-04-12 ENCOUNTER — Encounter: Payer: Self-pay | Admitting: Nurse Practitioner

## 2021-04-12 ENCOUNTER — Inpatient Hospital Stay: Payer: Medicare HMO

## 2021-04-12 VITALS — BP 114/59 | HR 90 | Temp 98.2°F | Resp 17 | Ht 63.0 in | Wt 146.9 lb

## 2021-04-12 DIAGNOSIS — Z7984 Long term (current) use of oral hypoglycemic drugs: Secondary | ICD-10-CM | POA: Diagnosis not present

## 2021-04-12 DIAGNOSIS — D696 Thrombocytopenia, unspecified: Secondary | ICD-10-CM

## 2021-04-12 DIAGNOSIS — E1142 Type 2 diabetes mellitus with diabetic polyneuropathy: Secondary | ICD-10-CM | POA: Diagnosis not present

## 2021-04-12 DIAGNOSIS — C9310 Chronic myelomonocytic leukemia not having achieved remission: Secondary | ICD-10-CM

## 2021-04-12 DIAGNOSIS — Z5111 Encounter for antineoplastic chemotherapy: Secondary | ICD-10-CM | POA: Diagnosis not present

## 2021-04-12 DIAGNOSIS — Z79899 Other long term (current) drug therapy: Secondary | ICD-10-CM | POA: Diagnosis not present

## 2021-04-12 DIAGNOSIS — K5900 Constipation, unspecified: Secondary | ICD-10-CM | POA: Diagnosis not present

## 2021-04-12 DIAGNOSIS — I10 Essential (primary) hypertension: Secondary | ICD-10-CM | POA: Diagnosis not present

## 2021-04-12 DIAGNOSIS — Z452 Encounter for adjustment and management of vascular access device: Secondary | ICD-10-CM

## 2021-04-12 DIAGNOSIS — D693 Immune thrombocytopenic purpura: Secondary | ICD-10-CM

## 2021-04-12 DIAGNOSIS — R519 Headache, unspecified: Secondary | ICD-10-CM | POA: Diagnosis not present

## 2021-04-12 LAB — SAMPLE TO BLOOD BANK

## 2021-04-12 LAB — CBC WITH DIFFERENTIAL (CANCER CENTER ONLY)
Abs Immature Granulocytes: 1.58 10*3/uL — ABNORMAL HIGH (ref 0.00–0.07)
Basophils Absolute: 0.1 10*3/uL (ref 0.0–0.1)
Basophils Relative: 1 %
Eosinophils Absolute: 0.2 10*3/uL (ref 0.0–0.5)
Eosinophils Relative: 1 %
HCT: 28.4 % — ABNORMAL LOW (ref 36.0–46.0)
Hemoglobin: 8.8 g/dL — ABNORMAL LOW (ref 12.0–15.0)
Immature Granulocytes: 10 %
Lymphocytes Relative: 7 %
Lymphs Abs: 1.2 10*3/uL (ref 0.7–4.0)
MCH: 27.2 pg (ref 26.0–34.0)
MCHC: 31 g/dL (ref 30.0–36.0)
MCV: 87.7 fL (ref 80.0–100.0)
Monocytes Absolute: 1.8 10*3/uL — ABNORMAL HIGH (ref 0.1–1.0)
Monocytes Relative: 11 %
Neutro Abs: 10.9 10*3/uL — ABNORMAL HIGH (ref 1.7–7.7)
Neutrophils Relative %: 70 %
Platelet Count: 46 10*3/uL — ABNORMAL LOW (ref 150–400)
RBC: 3.24 MIL/uL — ABNORMAL LOW (ref 3.87–5.11)
RDW: 17.8 % — ABNORMAL HIGH (ref 11.5–15.5)
WBC Count: 15.7 10*3/uL — ABNORMAL HIGH (ref 4.0–10.5)
nRBC: 0 % (ref 0.0–0.2)

## 2021-04-12 LAB — CMP (CANCER CENTER ONLY)
ALT: 20 U/L (ref 0–44)
AST: 19 U/L (ref 15–41)
Albumin: 3.2 g/dL — ABNORMAL LOW (ref 3.5–5.0)
Alkaline Phosphatase: 162 U/L — ABNORMAL HIGH (ref 38–126)
Anion gap: 7 (ref 5–15)
BUN: 15 mg/dL (ref 8–23)
CO2: 26 mmol/L (ref 22–32)
Calcium: 8.3 mg/dL — ABNORMAL LOW (ref 8.9–10.3)
Chloride: 105 mmol/L (ref 98–111)
Creatinine: 0.76 mg/dL (ref 0.44–1.00)
GFR, Estimated: >60 mL/min (ref >60)
Glucose, Bld: 202 mg/dL — ABNORMAL HIGH (ref 70–99)
Potassium: 4.2 mmol/L (ref 3.5–5.1)
Sodium: 138 mmol/L (ref 135–145)
Total Bilirubin: 0.6 mg/dL (ref 0.3–1.2)
Total Protein: 6.7 g/dL (ref 6.5–8.1)

## 2021-04-12 MED ORDER — ROMIPLOSTIM INJECTION 500 MCG
700.0000 ug | Freq: Once | SUBCUTANEOUS | Status: AC
  Administered 2021-04-12: 700 ug via SUBCUTANEOUS
  Filled 2021-04-12: qty 1

## 2021-04-12 MED ORDER — SODIUM CHLORIDE 0.9% FLUSH
10.0000 mL | INTRAVENOUS | Status: DC | PRN
  Administered 2021-04-12: 10 mL via INTRAVENOUS

## 2021-04-14 ENCOUNTER — Other Ambulatory Visit: Payer: Self-pay

## 2021-04-14 DIAGNOSIS — D696 Thrombocytopenia, unspecified: Secondary | ICD-10-CM

## 2021-04-14 NOTE — Progress Notes (Signed)
Per Dr Burr Medico, we will not do HLA matched Plts on 04/15/2021 since the pt's Plts on 04/12/2021 were 46.  We will do random plts on 04/15/2021.  Orders for plts entered and confirmed with blood bank.

## 2021-04-15 ENCOUNTER — Inpatient Hospital Stay: Payer: Medicare HMO | Attending: Hematology

## 2021-04-15 ENCOUNTER — Inpatient Hospital Stay: Payer: Medicare HMO

## 2021-04-15 ENCOUNTER — Other Ambulatory Visit: Payer: Self-pay

## 2021-04-15 DIAGNOSIS — R69 Illness, unspecified: Secondary | ICD-10-CM | POA: Diagnosis not present

## 2021-04-15 DIAGNOSIS — Z79899 Other long term (current) drug therapy: Secondary | ICD-10-CM | POA: Insufficient documentation

## 2021-04-15 DIAGNOSIS — R768 Other specified abnormal immunological findings in serum: Secondary | ICD-10-CM | POA: Diagnosis not present

## 2021-04-15 DIAGNOSIS — Z5111 Encounter for antineoplastic chemotherapy: Secondary | ICD-10-CM | POA: Insufficient documentation

## 2021-04-15 DIAGNOSIS — I10 Essential (primary) hypertension: Secondary | ICD-10-CM | POA: Diagnosis not present

## 2021-04-15 DIAGNOSIS — F410 Panic disorder [episodic paroxysmal anxiety] without agoraphobia: Secondary | ICD-10-CM | POA: Diagnosis not present

## 2021-04-15 DIAGNOSIS — D649 Anemia, unspecified: Secondary | ICD-10-CM | POA: Diagnosis not present

## 2021-04-15 DIAGNOSIS — E1142 Type 2 diabetes mellitus with diabetic polyneuropathy: Secondary | ICD-10-CM | POA: Diagnosis not present

## 2021-04-15 DIAGNOSIS — E538 Deficiency of other specified B group vitamins: Secondary | ICD-10-CM | POA: Diagnosis not present

## 2021-04-15 DIAGNOSIS — C9310 Chronic myelomonocytic leukemia not having achieved remission: Secondary | ICD-10-CM | POA: Insufficient documentation

## 2021-04-15 DIAGNOSIS — E1169 Type 2 diabetes mellitus with other specified complication: Secondary | ICD-10-CM | POA: Diagnosis not present

## 2021-04-15 DIAGNOSIS — R519 Headache, unspecified: Secondary | ICD-10-CM | POA: Diagnosis not present

## 2021-04-15 DIAGNOSIS — D696 Thrombocytopenia, unspecified: Secondary | ICD-10-CM

## 2021-04-15 DIAGNOSIS — D693 Immune thrombocytopenic purpura: Secondary | ICD-10-CM | POA: Diagnosis present

## 2021-04-15 LAB — CMP (CANCER CENTER ONLY)
ALT: 22 U/L (ref 0–44)
AST: 21 U/L (ref 15–41)
Albumin: 3.3 g/dL — ABNORMAL LOW (ref 3.5–5.0)
Alkaline Phosphatase: 167 U/L — ABNORMAL HIGH (ref 38–126)
Anion gap: 7 (ref 5–15)
BUN: 15 mg/dL (ref 8–23)
CO2: 26 mmol/L (ref 22–32)
Calcium: 8.5 mg/dL — ABNORMAL LOW (ref 8.9–10.3)
Chloride: 106 mmol/L (ref 98–111)
Creatinine: 0.75 mg/dL (ref 0.44–1.00)
GFR, Estimated: >60 mL/min
Glucose, Bld: 164 mg/dL — ABNORMAL HIGH (ref 70–99)
Potassium: 4.4 mmol/L (ref 3.5–5.1)
Sodium: 139 mmol/L (ref 135–145)
Total Bilirubin: 0.6 mg/dL (ref 0.3–1.2)
Total Protein: 7 g/dL (ref 6.5–8.1)

## 2021-04-15 LAB — CBC WITH DIFFERENTIAL (CANCER CENTER ONLY)
Abs Immature Granulocytes: 1.24 10*3/uL — ABNORMAL HIGH (ref 0.00–0.07)
Basophils Absolute: 0.2 10*3/uL — ABNORMAL HIGH (ref 0.0–0.1)
Basophils Relative: 1 %
Eosinophils Absolute: 0.1 10*3/uL (ref 0.0–0.5)
Eosinophils Relative: 1 %
HCT: 28.7 % — ABNORMAL LOW (ref 36.0–46.0)
Hemoglobin: 9.1 g/dL — ABNORMAL LOW (ref 12.0–15.0)
Immature Granulocytes: 7 %
Lymphocytes Relative: 7 %
Lymphs Abs: 1.2 10*3/uL (ref 0.7–4.0)
MCH: 27.4 pg (ref 26.0–34.0)
MCHC: 31.7 g/dL (ref 30.0–36.0)
MCV: 86.4 fL (ref 80.0–100.0)
Monocytes Absolute: 2.5 10*3/uL — ABNORMAL HIGH (ref 0.1–1.0)
Monocytes Relative: 14 %
Neutro Abs: 12.5 10*3/uL — ABNORMAL HIGH (ref 1.7–7.7)
Neutrophils Relative %: 70 %
Platelet Count: 33 10*3/uL — ABNORMAL LOW (ref 150–400)
RBC: 3.32 MIL/uL — ABNORMAL LOW (ref 3.87–5.11)
RDW: 18.3 % — ABNORMAL HIGH (ref 11.5–15.5)
WBC Count: 17.8 10*3/uL — ABNORMAL HIGH (ref 4.0–10.5)
nRBC: 0 % (ref 0.0–0.2)

## 2021-04-15 LAB — SAMPLE TO BLOOD BANK

## 2021-04-15 NOTE — Patient Instructions (Signed)
PICC Home Care Guide A peripherally inserted central catheter (PICC) is a form of IV access that allows medicines and IV fluids to be quickly distributed throughout the body. The PICC is a long, thin, flexible tube (catheter) that is inserted into a vein in the upper arm. The catheter ends in a large vein in the chest (superior vena cava, or SVC). After the PICC is inserted, a chest X-ray may be done to make sure that it is in the correct place. A PICC may be placed for different reasons, such as: To give medicines and liquid nutrition. To give IV fluids and blood products. If there is trouble placing a peripheral intravenous (PIV) catheter. If taken care of properly, a PICC can remain in place for several months. Having a PICC can also allow a person to go home from the hospital sooner. Medicine and PICC care can be managed at home by a family member, caregiver, or home health care team. What are the risks? Generally, having a PICC is safe. However, problems may occur, including: A blood clot (thrombus) forming in or at the tip of the PICC. A blood clot forming in a vein (deep vein thrombosis) or traveling to the lung (pulmonary embolism). Inflammation of the vein (phlebitis) in which the PICC is placed. Infection. Central line associated blood stream infection (CLABSI) is a serious infection that often requires hospitalization. PICC movement (malposition). The PICC tip may move from its original position due to excessive physical activity, forceful coughing, sneezing, or vomiting. A break or cut in the PICC. It is important not to use scissors near the PICC. Nerve or tendon irritation or injury during PICC insertion. How to take care of your PICC Preventing problems You and any caregivers should wash your hands often with soap. Wash hands: Before touching the PICC line or the infusion device. Before changing a bandage (dressing). Flush the PICC as told by your health care provider. Let your  health care provider know right away if the PICC is hard to flush or does not flush. Do not use force to flush the PICC. Do not use a syringe that is less than 10 mL to flush the PICC. Avoid blood pressure checks on the arm in which the PICC is placed. Never pull or tug on the PICC. Do not take the PICC out yourself. Only a trained clinical professional should remove the PICC. Use clean and sterile supplies only. Keep the supplies in a dry place. Do not reuse needles, syringes, or any other supplies. Doing that can lead to infection. Keep pets and children away from your PICC line. Check the PICC insertion site every day for signs of infection. Check for: Leakage. Redness, swelling, or pain. Fluid or blood. Warmth. Pus or a bad smell. PICC dressing care Keep your PICC bandage (dressing) clean and dry to prevent infection. Do not take baths, swim, or use a hot tub until your health care provider approves. Ask your health care provider if you can take showers. You may only be allowed to take sponge baths for bathing. When you are allowed to shower: Ask your health care provider to teach you how to wrap the PICC line. Cover the PICC line with clear plastic wrap and tape to keep it dry while showering. Follow instructions from your health care provider about how to take care of your insertion site and dressing. Make sure you: Wash your hands with soap and water before you change your bandage (dressing). If soap and water are  not available, use hand sanitizer. Change your dressing as told by your health care provider. Leave stitches (sutures), skin glue, or adhesive strips in place. These skin closures may need to stay in place for 2 weeks or longer. If adhesive strip edges start to loosen and curl up, you may trim the loose edges. Do not remove adhesive strips completely unless your health care provider tells you to do that. Change your PICC dressing if it becomes loose or wet. General  instructions  Carry your PICC identification card or wear a medical alert bracelet at all times. Keep the tube clamped at all times, unless it is being used. Carry a smooth-edge clamp with you at all times to place on the tube if it breaks. Do not use scissors or sharp objects near the tube. You may bend your arm and move it freely. If your PICC is near or at the bend of your elbow, avoid activity with repeated motion at the elbow. Avoid lifting heavy objects as told by your health care provider. Keep all follow-up visits as told by your health care provider. This is important. Disposal of supplies Throw away any syringes in a disposal container that is meant for sharp items (sharps container). You can buy a sharps container from a pharmacy, or you can make one by using an empty hard plastic bottle with a cover. Place any used dressings or infusion bags into a plastic bag. Throw that bag in the trash. Contact a health care provider if: You have pain in your arm, ear, face, or teeth. You have a fever or chills. You have redness, swelling, or pain around the insertion site. You have fluid or blood coming from the insertion site. Your insertion site feels warm to the touch. You have pus or a bad smell coming from the insertion site. Your skin feels hard and raised around the insertion site. Get help right away if: Your PICC is accidentally pulled all the way out. If this happens, cover the insertion site with a bandage or gauze dressing. Do not throw the PICC away. Your health care provider will need to check it. Your PICC was tugged or pulled and has partially come out. Do not  push the PICC back in. You cannot flush the PICC, it is hard to flush, or the PICC leaks around the insertion site when it is flushed. You hear a "flushing" sound when the PICC is flushed. You feel your heart racing or skipping beats. There is a hole or tear in the PICC. You have swelling in the arm in which the PICC  was inserted. You have a red streak going up your arm from where the PICC was inserted. Summary A peripherally inserted central catheter (PICC) is a long, thin, flexible tube (catheter) that is inserted into a vein in the upper arm. The PICC is inserted using a sterile technique by a specially trained nurse or physician. Only a trained clinical professional should remove it. Keep your PICC identification card with you at all times. Avoid blood pressure checks on the arm in which the PICC is placed. If cared for properly, a PICC can remain in place for several months. Having a PICC can also allow a person to go home from the hospital sooner. This information is not intended to replace advice given to you by your health care provider. Make sure you discuss any questions you have with your health care provider. Document Revised: 10/05/2019 Document Reviewed: 10/09/2019 Elsevier Patient Education  2022  [employer].

## 2021-04-17 ENCOUNTER — Encounter: Payer: Self-pay | Admitting: Hematology

## 2021-04-17 ENCOUNTER — Other Ambulatory Visit: Payer: Self-pay | Admitting: Hematology

## 2021-04-19 ENCOUNTER — Encounter: Payer: Self-pay | Admitting: Hematology

## 2021-04-19 ENCOUNTER — Other Ambulatory Visit: Payer: Medicare HMO

## 2021-04-19 DIAGNOSIS — S22060A Wedge compression fracture of T7-T8 vertebra, initial encounter for closed fracture: Secondary | ICD-10-CM | POA: Diagnosis not present

## 2021-04-19 DIAGNOSIS — E119 Type 2 diabetes mellitus without complications: Secondary | ICD-10-CM | POA: Diagnosis not present

## 2021-04-19 DIAGNOSIS — T451X5A Adverse effect of antineoplastic and immunosuppressive drugs, initial encounter: Secondary | ICD-10-CM | POA: Diagnosis not present

## 2021-04-19 DIAGNOSIS — M8458XA Pathological fracture in neoplastic disease, other specified site, initial encounter for fracture: Secondary | ICD-10-CM | POA: Diagnosis not present

## 2021-04-19 DIAGNOSIS — M5489 Other dorsalgia: Secondary | ICD-10-CM | POA: Diagnosis not present

## 2021-04-19 DIAGNOSIS — D696 Thrombocytopenia, unspecified: Secondary | ICD-10-CM | POA: Diagnosis not present

## 2021-04-19 DIAGNOSIS — D6949 Other primary thrombocytopenia: Secondary | ICD-10-CM | POA: Diagnosis not present

## 2021-04-19 DIAGNOSIS — Y92538 Other ambulatory health services establishments as the place of occurrence of the external cause: Secondary | ICD-10-CM | POA: Diagnosis not present

## 2021-04-19 DIAGNOSIS — Z9884 Bariatric surgery status: Secondary | ICD-10-CM | POA: Diagnosis not present

## 2021-04-19 DIAGNOSIS — D6959 Other secondary thrombocytopenia: Secondary | ICD-10-CM | POA: Diagnosis not present

## 2021-04-19 DIAGNOSIS — Z809 Family history of malignant neoplasm, unspecified: Secondary | ICD-10-CM | POA: Diagnosis not present

## 2021-04-19 DIAGNOSIS — Z7984 Long term (current) use of oral hypoglycemic drugs: Secondary | ICD-10-CM | POA: Diagnosis not present

## 2021-04-19 DIAGNOSIS — Z79624 Long term (current) use of inhibitors of nucleotide synthesis: Secondary | ICD-10-CM | POA: Diagnosis not present

## 2021-04-19 DIAGNOSIS — M549 Dorsalgia, unspecified: Secondary | ICD-10-CM | POA: Diagnosis not present

## 2021-04-19 DIAGNOSIS — I10 Essential (primary) hypertension: Secondary | ICD-10-CM | POA: Diagnosis not present

## 2021-04-19 DIAGNOSIS — C9310 Chronic myelomonocytic leukemia not having achieved remission: Secondary | ICD-10-CM | POA: Diagnosis not present

## 2021-04-19 DIAGNOSIS — Z856 Personal history of leukemia: Secondary | ICD-10-CM | POA: Diagnosis not present

## 2021-04-20 DIAGNOSIS — Z7984 Long term (current) use of oral hypoglycemic drugs: Secondary | ICD-10-CM | POA: Diagnosis not present

## 2021-04-20 DIAGNOSIS — C9310 Chronic myelomonocytic leukemia not having achieved remission: Secondary | ICD-10-CM | POA: Diagnosis not present

## 2021-04-20 DIAGNOSIS — D696 Thrombocytopenia, unspecified: Secondary | ICD-10-CM | POA: Diagnosis not present

## 2021-04-20 DIAGNOSIS — M8458XA Pathological fracture in neoplastic disease, other specified site, initial encounter for fracture: Secondary | ICD-10-CM | POA: Diagnosis not present

## 2021-04-20 DIAGNOSIS — I10 Essential (primary) hypertension: Secondary | ICD-10-CM | POA: Diagnosis not present

## 2021-04-20 DIAGNOSIS — E119 Type 2 diabetes mellitus without complications: Secondary | ICD-10-CM | POA: Diagnosis not present

## 2021-04-21 ENCOUNTER — Telehealth: Payer: Self-pay | Admitting: Family Medicine

## 2021-04-21 ENCOUNTER — Telehealth: Payer: Self-pay

## 2021-04-21 DIAGNOSIS — M549 Dorsalgia, unspecified: Secondary | ICD-10-CM | POA: Diagnosis not present

## 2021-04-21 DIAGNOSIS — I10 Essential (primary) hypertension: Secondary | ICD-10-CM | POA: Diagnosis not present

## 2021-04-21 DIAGNOSIS — D696 Thrombocytopenia, unspecified: Secondary | ICD-10-CM | POA: Diagnosis not present

## 2021-04-21 DIAGNOSIS — C9310 Chronic myelomonocytic leukemia not having achieved remission: Secondary | ICD-10-CM | POA: Diagnosis not present

## 2021-04-21 DIAGNOSIS — E1142 Type 2 diabetes mellitus with diabetic polyneuropathy: Secondary | ICD-10-CM | POA: Diagnosis not present

## 2021-04-21 DIAGNOSIS — E1169 Type 2 diabetes mellitus with other specified complication: Secondary | ICD-10-CM | POA: Diagnosis not present

## 2021-04-21 DIAGNOSIS — E1165 Type 2 diabetes mellitus with hyperglycemia: Secondary | ICD-10-CM | POA: Diagnosis not present

## 2021-04-21 DIAGNOSIS — E782 Mixed hyperlipidemia: Secondary | ICD-10-CM | POA: Diagnosis not present

## 2021-04-21 DIAGNOSIS — M8448XD Pathological fracture, other site, subsequent encounter for fracture with routine healing: Secondary | ICD-10-CM | POA: Diagnosis not present

## 2021-04-21 DIAGNOSIS — M2550 Pain in unspecified joint: Secondary | ICD-10-CM | POA: Diagnosis not present

## 2021-04-21 NOTE — Telephone Encounter (Signed)
Dr. Genelle Bal w/[facility] called regarding follow-up care for this pt.  Pt was inpatient at [facility] and discharged home today.  Dr. Genelle Bal would like to speak with Dr. Burr Medico regarding care while pt was admitted into [facility].  Dr. Areta Haber cellphone number is [phone].  The pt is scheduled tomorrow 04/22/2021 for labs and infusion (injection).

## 2021-04-21 NOTE — Telephone Encounter (Signed)
[facility] calling and said pt is being discharged due to she is not able to do physical therapy due to back hurting. They also said that pt is going out of town next week. They wanted to make provider aware

## 2021-04-21 NOTE — Telephone Encounter (Signed)
FYI

## 2021-04-22 ENCOUNTER — Telehealth: Payer: Self-pay | Admitting: Hematology

## 2021-04-22 ENCOUNTER — Inpatient Hospital Stay: Payer: Medicare HMO

## 2021-04-22 ENCOUNTER — Other Ambulatory Visit: Payer: Self-pay

## 2021-04-22 DIAGNOSIS — I10 Essential (primary) hypertension: Secondary | ICD-10-CM | POA: Diagnosis not present

## 2021-04-22 DIAGNOSIS — E1142 Type 2 diabetes mellitus with diabetic polyneuropathy: Secondary | ICD-10-CM | POA: Diagnosis not present

## 2021-04-22 DIAGNOSIS — D693 Immune thrombocytopenic purpura: Secondary | ICD-10-CM

## 2021-04-22 DIAGNOSIS — D649 Anemia, unspecified: Secondary | ICD-10-CM | POA: Diagnosis not present

## 2021-04-22 DIAGNOSIS — D696 Thrombocytopenia, unspecified: Secondary | ICD-10-CM

## 2021-04-22 DIAGNOSIS — Z452 Encounter for adjustment and management of vascular access device: Secondary | ICD-10-CM

## 2021-04-22 DIAGNOSIS — C9310 Chronic myelomonocytic leukemia not having achieved remission: Secondary | ICD-10-CM | POA: Diagnosis not present

## 2021-04-22 DIAGNOSIS — Z5111 Encounter for antineoplastic chemotherapy: Secondary | ICD-10-CM | POA: Diagnosis not present

## 2021-04-22 DIAGNOSIS — E538 Deficiency of other specified B group vitamins: Secondary | ICD-10-CM | POA: Diagnosis not present

## 2021-04-22 DIAGNOSIS — R519 Headache, unspecified: Secondary | ICD-10-CM | POA: Diagnosis not present

## 2021-04-22 DIAGNOSIS — Z79899 Other long term (current) drug therapy: Secondary | ICD-10-CM | POA: Diagnosis not present

## 2021-04-22 DIAGNOSIS — R69 Illness, unspecified: Secondary | ICD-10-CM | POA: Diagnosis not present

## 2021-04-22 LAB — CMP (CANCER CENTER ONLY)
ALT: 21 U/L (ref 0–44)
AST: 21 U/L (ref 15–41)
Albumin: 3.3 g/dL — ABNORMAL LOW (ref 3.5–5.0)
Alkaline Phosphatase: 154 U/L — ABNORMAL HIGH (ref 38–126)
Anion gap: 9 (ref 5–15)
BUN: 17 mg/dL (ref 8–23)
CO2: 24 mmol/L (ref 22–32)
Calcium: 8.4 mg/dL — ABNORMAL LOW (ref 8.9–10.3)
Chloride: 107 mmol/L (ref 98–111)
Creatinine: 0.75 mg/dL (ref 0.44–1.00)
GFR, Estimated: >60 mL/min (ref >60)
Glucose, Bld: 153 mg/dL — ABNORMAL HIGH (ref 70–99)
Potassium: 4.3 mmol/L (ref 3.5–5.1)
Sodium: 140 mmol/L (ref 135–145)
Total Bilirubin: 0.8 mg/dL (ref 0.3–1.2)
Total Protein: 6.9 g/dL (ref 6.5–8.1)

## 2021-04-22 LAB — CBC WITH DIFFERENTIAL (CANCER CENTER ONLY)
Abs Immature Granulocytes: 0.05 10*3/uL (ref 0.00–0.07)
Basophils Absolute: 0 10*3/uL (ref 0.0–0.1)
Basophils Relative: 1 %
Eosinophils Absolute: 0 10*3/uL (ref 0.0–0.5)
Eosinophils Relative: 1 %
HCT: 26.5 % — ABNORMAL LOW (ref 36.0–46.0)
Hemoglobin: 8.4 g/dL — ABNORMAL LOW (ref 12.0–15.0)
Immature Granulocytes: 1 %
Lymphocytes Relative: 10 %
Lymphs Abs: 0.6 10*3/uL — ABNORMAL LOW (ref 0.7–4.0)
MCH: 27.6 pg (ref 26.0–34.0)
MCHC: 31.7 g/dL (ref 30.0–36.0)
MCV: 87.2 fL (ref 80.0–100.0)
Monocytes Absolute: 1.1 10*3/uL — ABNORMAL HIGH (ref 0.1–1.0)
Monocytes Relative: 17 %
Neutro Abs: 4.4 10*3/uL (ref 1.7–7.7)
Neutrophils Relative %: 70 %
Platelet Count: 15 10*3/uL — ABNORMAL LOW (ref 150–400)
RBC: 3.04 MIL/uL — ABNORMAL LOW (ref 3.87–5.11)
RDW: 18 % — ABNORMAL HIGH (ref 11.5–15.5)
WBC Count: 6.2 10*3/uL (ref 4.0–10.5)
nRBC: 0 % (ref 0.0–0.2)

## 2021-04-22 LAB — SAMPLE TO BLOOD BANK

## 2021-04-22 MED ORDER — SODIUM CHLORIDE 0.9% FLUSH
10.0000 mL | INTRAVENOUS | Status: AC | PRN
  Administered 2021-04-22: 10 mL

## 2021-04-22 MED ORDER — SODIUM CHLORIDE 0.9% IV SOLUTION
250.0000 mL | Freq: Once | INTRAVENOUS | Status: AC
  Administered 2021-04-22: 250 mL via INTRAVENOUS

## 2021-04-22 NOTE — Telephone Encounter (Signed)
Rescheduled appointments per melanie. Patient aware.

## 2021-04-22 NOTE — Telephone Encounter (Signed)
Melissa calling from [facility] to get verbal orders to discharge patient from receiving services from them.   Melissa can be reached at [phone].  This is her 2nd request.

## 2021-04-22 NOTE — Progress Notes (Signed)
1 unit of Plts ordered for Plt 15 today

## 2021-04-23 LAB — PREPARE PLATELET PHERESIS: Unit division: 0

## 2021-04-23 LAB — BPAM PLATELET PHERESIS
Blood Product Expiration Date: 202211112359
ISSUE DATE / TIME: 202211100915
Unit Type and Rh: 6200

## 2021-04-23 NOTE — Telephone Encounter (Signed)
LM  to return call to discuss.  

## 2021-04-23 NOTE — Telephone Encounter (Signed)
2nd attempt to inform them it is ok to discharge pt

## 2021-04-23 NOTE — Telephone Encounter (Signed)
Please advise if ok to discharge pt

## 2021-04-26 ENCOUNTER — Other Ambulatory Visit: Payer: Self-pay

## 2021-04-26 ENCOUNTER — Ambulatory Visit: Payer: Medicare HMO

## 2021-04-26 ENCOUNTER — Inpatient Hospital Stay: Payer: Medicare HMO

## 2021-04-26 ENCOUNTER — Inpatient Hospital Stay (HOSPITAL_BASED_OUTPATIENT_CLINIC_OR_DEPARTMENT_OTHER): Payer: Medicare HMO | Admitting: Hematology

## 2021-04-26 ENCOUNTER — Other Ambulatory Visit: Payer: Self-pay | Admitting: Hematology

## 2021-04-26 ENCOUNTER — Other Ambulatory Visit: Payer: Medicare HMO

## 2021-04-26 ENCOUNTER — Telehealth: Payer: Self-pay

## 2021-04-26 ENCOUNTER — Telehealth: Payer: Self-pay | Admitting: *Deleted

## 2021-04-26 VITALS — BP 123/52 | HR 89 | Temp 97.9°F | Resp 18 | Wt 146.0 lb

## 2021-04-26 DIAGNOSIS — R69 Illness, unspecified: Secondary | ICD-10-CM | POA: Diagnosis not present

## 2021-04-26 DIAGNOSIS — Z79899 Other long term (current) drug therapy: Secondary | ICD-10-CM | POA: Diagnosis not present

## 2021-04-26 DIAGNOSIS — E538 Deficiency of other specified B group vitamins: Secondary | ICD-10-CM | POA: Diagnosis not present

## 2021-04-26 DIAGNOSIS — D696 Thrombocytopenia, unspecified: Secondary | ICD-10-CM

## 2021-04-26 DIAGNOSIS — C9310 Chronic myelomonocytic leukemia not having achieved remission: Secondary | ICD-10-CM | POA: Diagnosis not present

## 2021-04-26 DIAGNOSIS — D693 Immune thrombocytopenic purpura: Secondary | ICD-10-CM

## 2021-04-26 DIAGNOSIS — R519 Headache, unspecified: Secondary | ICD-10-CM | POA: Diagnosis not present

## 2021-04-26 DIAGNOSIS — Z452 Encounter for adjustment and management of vascular access device: Secondary | ICD-10-CM

## 2021-04-26 DIAGNOSIS — Z5111 Encounter for antineoplastic chemotherapy: Secondary | ICD-10-CM | POA: Diagnosis not present

## 2021-04-26 DIAGNOSIS — D649 Anemia, unspecified: Secondary | ICD-10-CM | POA: Diagnosis not present

## 2021-04-26 DIAGNOSIS — E1142 Type 2 diabetes mellitus with diabetic polyneuropathy: Secondary | ICD-10-CM | POA: Diagnosis not present

## 2021-04-26 DIAGNOSIS — I10 Essential (primary) hypertension: Secondary | ICD-10-CM | POA: Diagnosis not present

## 2021-04-26 LAB — CBC WITH DIFFERENTIAL (CANCER CENTER ONLY)
Abs Immature Granulocytes: 0.04 10*3/uL (ref 0.00–0.07)
Basophils Absolute: 0.1 10*3/uL (ref 0.0–0.1)
Basophils Relative: 1 %
Eosinophils Absolute: 0 10*3/uL (ref 0.0–0.5)
Eosinophils Relative: 0 %
HCT: 26.9 % — ABNORMAL LOW (ref 36.0–46.0)
Hemoglobin: 8.5 g/dL — ABNORMAL LOW (ref 12.0–15.0)
Immature Granulocytes: 1 %
Lymphocytes Relative: 11 %
Lymphs Abs: 0.6 10*3/uL — ABNORMAL LOW (ref 0.7–4.0)
MCH: 27.2 pg (ref 26.0–34.0)
MCHC: 31.6 g/dL (ref 30.0–36.0)
MCV: 85.9 fL (ref 80.0–100.0)
Monocytes Absolute: 1 10*3/uL (ref 0.1–1.0)
Monocytes Relative: 18 %
Neutro Abs: 3.8 10*3/uL (ref 1.7–7.7)
Neutrophils Relative %: 69 %
Platelet Count: 5 10*3/uL — CL (ref 150–400)
RBC: 3.13 MIL/uL — ABNORMAL LOW (ref 3.87–5.11)
RDW: 17.5 % — ABNORMAL HIGH (ref 11.5–15.5)
WBC Count: 5.5 10*3/uL (ref 4.0–10.5)
nRBC: 0 % (ref 0.0–0.2)

## 2021-04-26 LAB — CMP (CANCER CENTER ONLY)
ALT: 13 U/L (ref 0–44)
AST: 14 U/L — ABNORMAL LOW (ref 15–41)
Albumin: 3.4 g/dL — ABNORMAL LOW (ref 3.5–5.0)
Alkaline Phosphatase: 142 U/L — ABNORMAL HIGH (ref 38–126)
Anion gap: 8 (ref 5–15)
BUN: 17 mg/dL (ref 8–23)
CO2: 24 mmol/L (ref 22–32)
Calcium: 8.6 mg/dL — ABNORMAL LOW (ref 8.9–10.3)
Chloride: 107 mmol/L (ref 98–111)
Creatinine: 0.77 mg/dL (ref 0.44–1.00)
GFR, Estimated: >60 mL/min (ref >60)
Glucose, Bld: 167 mg/dL — ABNORMAL HIGH (ref 70–99)
Potassium: 4.4 mmol/L (ref 3.5–5.1)
Sodium: 139 mmol/L (ref 135–145)
Total Bilirubin: 0.8 mg/dL (ref 0.3–1.2)
Total Protein: 7.2 g/dL (ref 6.5–8.1)

## 2021-04-26 LAB — SAMPLE TO BLOOD BANK

## 2021-04-26 MED ORDER — SODIUM CHLORIDE 0.9% FLUSH
10.0000 mL | INTRAVENOUS | Status: AC | PRN
  Administered 2021-04-26: 10 mL

## 2021-04-26 MED ORDER — OXYCODONE HCL 5 MG PO TABS
10.0000 mg | ORAL_TABLET | Freq: Once | ORAL | Status: AC
  Administered 2021-04-26: 10 mg via ORAL
  Filled 2021-04-26: qty 2

## 2021-04-26 MED ORDER — SODIUM CHLORIDE 0.9% FLUSH
10.0000 mL | INTRAVENOUS | Status: DC | PRN
  Administered 2021-04-26: 10 mL

## 2021-04-26 MED ORDER — SODIUM CHLORIDE 0.9 % IV SOLN: Freq: Once | INTRAVENOUS | Status: AC

## 2021-04-26 MED ORDER — SODIUM CHLORIDE 0.9 % IV SOLN
10.0000 mg | Freq: Once | INTRAVENOUS | Status: AC
  Administered 2021-04-26: 10 mg via INTRAVENOUS
  Filled 2021-04-26: qty 10

## 2021-04-26 MED ORDER — SODIUM CHLORIDE 0.9 % IV SOLN
75.0000 mg/m2 | Freq: Once | INTRAVENOUS | Status: AC
  Administered 2021-04-26: 135 mg via INTRAVENOUS
  Filled 2021-04-26: qty 13.5

## 2021-04-26 MED ORDER — ROMIPLOSTIM INJECTION 500 MCG
10.0000 ug/kg | Freq: Once | SUBCUTANEOUS | Status: AC
  Administered 2021-04-26: 660 ug via SUBCUTANEOUS
  Filled 2021-04-26: qty 1

## 2021-04-26 MED ORDER — SODIUM CHLORIDE 0.9% IV SOLUTION
250.0000 mL | Freq: Once | INTRAVENOUS | Status: AC
  Administered 2021-04-26: 250 mL via INTRAVENOUS

## 2021-04-26 MED ORDER — HEPARIN SOD (PORK) LOCK FLUSH 100 UNIT/ML IV SOLN
250.0000 [IU] | Freq: Once | INTRAVENOUS | Status: AC | PRN
  Administered 2021-04-26: 250 [IU]

## 2021-04-26 MED ORDER — PALONOSETRON HCL INJECTION 0.25 MG/5ML
0.2500 mg | Freq: Once | INTRAVENOUS | Status: AC
  Administered 2021-04-26: 0.25 mg via INTRAVENOUS
  Filled 2021-04-26: qty 5

## 2021-04-26 MED ORDER — ACETAMINOPHEN 325 MG PO TABS
650.0000 mg | ORAL_TABLET | Freq: Once | ORAL | Status: AC
  Administered 2021-04-26: 650 mg via ORAL
  Filled 2021-04-26: qty 2

## 2021-04-26 NOTE — Telephone Encounter (Signed)
LVM for Melissa with providers message and to Cb with any questions.

## 2021-04-26 NOTE — Telephone Encounter (Signed)
Received call from [facility] lab/Pam with critical plt result of 5.  Message to Dr Burr Medico.

## 2021-04-26 NOTE — Telephone Encounter (Signed)
Please advise if appropriate. Pt was given 30 d/s for short term use.

## 2021-04-26 NOTE — Progress Notes (Signed)
Kathleen Gibson   [phone] [phone]   Clinic Follow up Note   Patient Care Team: [name], DO as PCP - General (Family Medicine) Loletha Carrow Kirke Corin, MD as Consulting Physician (Gastroenterology) Truitt Merle, MD as Consulting Physician (Hematology) Alda Berthold, DO as Consulting Physician (Neurology)  Date of Service:  04/26/2021  CHIEF COMPLAINT: f/u of [name], severe thrombocytopenia  CURRENT THERAPY:  Azacitidine days 1-5 q28 days, started 12/28/20 Nplate 47mcg/kg weekly  Platelet transfusion as needed (plt<15K)  ASSESSMENT & PLAN:  Kathleen Gibson is a 70 y.o. female with   1. [name] with severe thrombocytopenia  -Initially diagnosed with ITP in 2014. She lost follow up after 11/2017 and did not proceed with recommended bone marrow biopsy. -She was referred to ED 10/28/20 by her PCP for low plt count of 4k. She was hospitalized and treated with platelet transfusions, IVIG, oral prednisone $RemoveBeforeD'60mg'BBLbKxXRytEQOE$  and Nplate injections. She tapered off prednisone given little response. -Bone marrow biopsy 10/30/20 felt to likely represent MDS/MPN, particularly [name].  Confirmed on slide review at [facility] by Dr. Linus Orn -She began treatment for severe refractory thrombocytopenia with weekly Rituxan x4 on 11/19/20, she did not respond  -She began azacitidine daily days 1-5 q. 28 days on 12/28/20 -She had worsening thrombocytopenia after stopping Nplate, restarted weekly Nplate 10 mcg/kg on 4/00/8676 -Bone marrow biopsy 03/18/2021 showed stable disease, no increase in blasts. -her platelet count had been much improved in the last month. Unfortunately, it has decreased back to 5k today (04/26/21)   2.  T7/T8 compression fracture -Pending kyphoplasty with Dr. Hulan Saas of neurosurgery at [facility] next week as long as platelets greater than 50K -In a thoracic brace today, she has persistent pain but adequately managed on oxycodone 4 times  daily   3.  Recent panic attack -Managed with family support and lorazepam as needed  -Recovered, no recurrent episodes   4. Normocytic anemia, moderate  -H/o pernicious anemia/vitamin B12 deficiency -Transfuse for Hgb </= 7.5 -Continue oral iron    5. Type 2 diabetes mellitus with polyneuropathy, Hypertension -Continue medication, follow-up PCP   6. Headache -Etiology unknown, managed with Tylenol -Stable   PLAN: -proceed with plt transfusion, Nplate, and azacitidine today -continue azacitidine daily until this Friday -lab and plt transfusion this Wednesday and Friday  -will fax orders for CBC and plt transfusion next week when she is in [place]  -lab and plt transfusion on Mondays and Thursdays in 2, 3 and 4 weeks - f/u in 2 weeks   No problem-specific Assessment & Plan notes found for this encounter.   SUMMARY OF ONCOLOGIC HISTORY: Oncology History  [name] (chronic myelomonocytic leukemia) ([place])  10/29/2020 Imaging   CT CAP  IMPRESSION: 1. Splenomegaly with pathologically enlarged lymph nodes above and below the diaphragm, with overall stable to minimally increased abdominal adenopathy and interval progression of the pelvic adenopathy. 2. Small volume abdominopelvic ascites with diffuse mesenteric stranding. 3. Scattered bilateral pulmonary micro nodules measuring 1-2 mm. 4. Distended gallbladder with some layering hyperdense material representing layering sludge and tiny stones seen on prior ultrasound. 5. Aortic atherosclerosis.   10/30/2020 Pathology Results   DIAGNOSIS:   BONE MARROW, ASPIRATE, CLOT, CORE:  -  Hypercellular bone marrow with panhyperplasia, atypia, and no  increase in blasts  -  See comment   PERIPHERAL BLOOD:  -  Marked thrombocytopenia  -  Absolute monocytosis  -  Normocytic anemia  -  See CBC data and  comment   COMMENT:  The bone marrow is hypercellular for age (approximately 80%) with myeloid hyperplasia with maturational left shift,  erythroid hyperplasia, and increased megakaryocytes.  Mild multilineage atypia is present. Blasts are not increased on aspirate smears (1% by manual differential count) or by CD34 immunohistochemistry on the core biopsy.  Concurrent flow cytometric analysis of the bone marrow aspirate demonstrates increased monocytes, and no increase in blasts or abnormal lymphoid population (see [id number]).  Monocytes are also increased in peripheral  blood, persistent per electronic medical record.  In aggregate, the  findings raise the possibility of a myeloid neoplasm with the  differential diagnosis including a low-grade myelodysplastic syndrome and chronic myelomonocytic leukemia (dysplastic type).    ADDENDUM:  A reticulin special stain performed on the bone marrow core biopsy reveals no significant increase in reticulin fibrosis.  ADDENDUM:  CYTOGENETIC RESULTS:  Karyotype: 46,XX[20]  Interpretation: NORMAL FEMALE KARYOTYPE   FISH RESULTS:  Results: NORMAL   ADDENDUM:  CD123 immunohistochemistry performed on the core biopsy highlights scattered aggregates of positively staining cells consistent with plasmacytoid dendritic cells.    10/30/2020 Pathology Results   DIAGNOSIS:   BONE MARROW; FLOW CYTOMETRIC ANALYSIS:  -  Increased monocytes  -  Scant B-cells present  -  No immunophenotypically aberrant T-cell population identified  -  No increase in blasts  -  See comment   COMMENT:  Monocytes are relatively increased (12% of all cells), without aberrant expression of CD56.  B-cells comprise <1% of total lymphocytes. CD34-positive blasts are not increased (<1% of all cells).  Correlation with concurrent morphology is recommended for complete diagnostic interpretation and overall blast enumeration (see [id number]).    10/30/2020 Pathology Results   FINAL MICROSCOPIC DIAGNOSIS:   A. LYMPH NODE, RIGHT AXILLARY, NEEDLE CORE BIOPSY:  -Lymphoid tissue present  -See comment   COMMENT:  The  sections show several small needle core biopsy fragments of lymphoid tissue displaying degenerative cellular changes/necrosis and hence cannot be accurately evaluated.  Sample for flow cytometric analysis not available.  Immunohistochemical stain for CD3 and CD20 were performed with appropriate controls.  There is a mixture of T and B cells in their apparently respective compartments.  There is no definite metastatic malignancy.    11/11/2020 Pathology Results   DIAGNOSIS:   LEFT AXILLARY LYMPH NODE EXCISIONAL BIOPSY; FLOW CYTOMETRIC ANALYSIS:  -  No monotypic B-cell or immunophenotypically aberrant T-cell  population identified  -  See comment   COMMENT:  Flow cytometric analysis identifies B-cells with a normal kappa:lambda ratio of 1.7:1.  A subset of the polytypic B-cells expresses CD10. T-cells show a CD4:CD8 ratio of 3.3:1 without immunophenotypic aberrancy with the markers evaluated.  Although these results do not support the diagnosis of a clonal lymphoid population, sampling issues must always be considered when negative results are obtained, as focal lesions may not be represented in the specimen submitted.  In addition, flow cytometric immunophenotyping will not exclude other pathology if present (e.g. Hodgkin lymphoma, some T-cell lymphomas, metastatic and infectious diseases).   11/11/2020 Pathology Results   FINAL MICROSCOPIC DIAGNOSIS:   A. LYMPH NODE, LEFT AXILLARY, DISSECTION:  -  Follicular hyperplasia with interfollicular expansion  -  See comment   COMMENT:  Sections of the lymph nodes reveal generally preserved lymph node architecture.  There is follicular hyperplasia with some follicles showing increased hyalinization of germinal centers and mild concentric encircling of germinal centers with small lymphocytes (onion skinning). Interfollicular areas are expanded with increased vascular proliferation, small lymphocytes, plasma  cells, histiocytic cells, and patchy neutrophils.   The lymph node capsule is variably thickened by fibrosis with few plasma cells.   A panel of immunohistochemical stains is performed for further  characterization.  CD3 and CD20/PAX5 highlight T-cell and B-cell  compartments, respectively.  Germinal centers are highlighted by CD10 and BCL6 with appropriate absent expression of BCL2.  CD5 is similar to CD3.  CD43 also stains the T-cells.  CD4-positive T-cells exceed CD8-positive T-cells.  CD30 highlights scattered immunoblasts.  CD21 reveals intact follicular dendritic cell meshworks.  CD68 highlights increased histiocytic cells.  HHV 8 is negative.  T. pallidum reveals no definitive organisms.  Pancytokeratin is negative.  TdT stains rare cells.  CD138 highlights plasma cells which are not increased in number and show polytypic light chain expression by kappa/lambda in situ  hybridization.  EBV is negative by in situ hybridization.   Concurrent flow cytometric analysis is negative for a monoclonal B-cell or immunophenotypically aberrant T-cell population (see [phone]).   Together, the findings above are non-specific and can be seen in  reactive and infectious processes as well as Castleman disease.  There is no definitive morphologic or flow cytometric evidence of involvement by a lymphoproliferative disorder from the current workup.   12/17/2020 Initial Diagnosis   [name] (chronic myelomonocytic leukemia) ([place])   12/28/2020 -  Chemotherapy   Patient is on Treatment Plan : MYELODYSPLASIA  Azacitidine IV D1-5 q28d     03/18/2021 Pathology Results   Final Diagnosis    BONE MARROW:             Persistent chronic myelomonocytic leukemia in a hypercellular bone marrow with increased monocytes and no increase in blasts.   Comment    Flow cytometric analysis showed no increase in blasts and 17% monocytes.A next generation myeloid panel showed the presence of the following mutations: NRAS, SH2B3, PHF6 and TET2. CD34 and CD117 immunstains do not show an  increase in blasts. The findings are consistent with persistent [name] with a decrease in the number of blasts compared to that seen on the previous bone marrow.    Final Interpretation      BONE MARROW; FLOW CYTOMETRIC ANALYSIS:   No increased blasts identified. (see comment)  Monocytosis (17% of total events).    COMMENT: Flow cytometry identified about 0.6% of total events as immature cells. These cells express CD45 (dim), CD34, CD117, HLA-DR, CD38, CD33 (variable). This immunophenotype is consistent with normal myeloblasts. In addition, monocytes are increased and comprise of approximately 17% of total events. These monocytes show normal expression of CD33/CD64/CD14/CD11b with variable expression of CD4. Correlation with concurrent morphology and clinical data is recommended (see [id number]).    FLOW CYTOMETRY ANALYSIS: CD45 versus side scatter analysis demonstrate a predominance of granulocytes (~70%), monocytes (~17%) and lymphocytes (~6%). No significant increase in blasts identified. All tested markers were used for adequate analysis of the cells and were appropriately reviewed.        INTERVAL HISTORY:  Kathleen Gibson is here for a follow up of [name], severe thrombocytopenia. She was last seen by NP Lacie on 04/12/21. She was seen in the infusion area. She reports her back continues to bother her. We discussed kyphoplasty again today. She notes she is leaving this Saturday to spend Thanksgiving with her daughter in [place]. She notes her daughter is a Marine scientist; we will work with her if Juanna needs platelet transfusion while traveling.   All other systems were reviewed with the patient and are negative.  MEDICAL HISTORY:  Past Medical History:  Diagnosis Date   Diabetes ([place])    HLD (hyperlipidemia)    Hypertension    Pernicious anemia    Pneumonia 09/05/2019   Renal insufficiency 09/06/2019   Vitamin D deficiency     SURGICAL HISTORY: Past Surgical History:  Procedure Laterality Date    ABDOMINAL HERNIA REPAIR  2009   ABDOMINOPLASTY     AXILLARY LYMPH NODE BIOPSY Left 11/11/2020   Procedure: LEFT AXILLARY EXCISIONAL LYMPH NODE BIOPSY;  Surgeon: [name], MD;  Location: [facility];  Service: General;  Laterality: Left;   [place]   hemorroid surgery  2007   [place]    I have reviewed the social history and family history with the patient and they are unchanged from previous note.  ALLERGIES:  is allergic to asa [aspirin] and nsaids.  MEDICATIONS:  Current Outpatient Medications  Medication Sig Dispense Refill   acetaminophen (TYLENOL) 500 MG tablet Take 2 tablets (1,000 mg total) by mouth every 8 (eight) hours. 30 tablet 0   Acetaminophen-Codeine 300-30 MG tablet Take 1 tablet by mouth as needed.     bisacodyl (DULCOLAX) 10 MG suppository Place 1 suppository (10 mg total) rectally daily as needed for moderate constipation (Please take-if no bowel movement in 1-2 days with MiraLAX/senna). 12 suppository 0   cyclobenzaprine (FLEXERIL) 10 MG tablet Take 0.5-1 tablets (5-10 mg total) by mouth 3 (three) times daily as needed for muscle spasms. 90 tablet 2   gabapentin (NEURONTIN) 300 MG capsule Take 2 capsules (600 mg total) by mouth 2 (two) times daily. 360 capsule 1   LORazepam (ATIVAN) 1 MG tablet TAKE 1 TAB BY MOUTH ONCE AS NEEDED FOR UP TO 1 DOSE FOR ANXIETY. TAKE 1 TAB 1 HR BEFORE MRI, AND OK TO TAKE SECOND TAB RIGHT BEFORE MRI IF NEEDED 30 tablet 0   metoprolol succinate (TOPROL-XL) 25 MG 24 hr tablet Take 1 tablet (25 mg total) by mouth daily. 90 tablet 1   naloxone (NARCAN) nasal spray 4 mg/0.1 mL Place 1 spray into the nose once as needed (overdose). 2 each 0   Oxycodone HCl 10 MG TABS Take 1 tablet (10 mg total) by mouth in the morning, at noon, in the evening, and at bedtime. 120 tablet 0   polyethylene glycol powder (GLYCOLAX/MIRALAX) 17 GM/SCOOP powder Dissolve 1 capful (17 g) in water and drink 2  (two) times daily. 510 g 2   senna-docusate (SENOKOT-S) 8.6-50 MG tablet Take 2 tablets by mouth at bedtime. 60 tablet 2   sitaGLIPtin-metformin (JANUMET) 50-1000 MG tablet Take 1 tablet by mouth at bedtime. 90 tablet 1   zolpidem (AMBIEN) 5 MG tablet Take 5 mg by mouth at bedtime. (Patient not taking: Reported on 03/30/2021)     No current facility-administered medications for this visit.   Facility-Administered Medications Ordered in Other Visits  Medication Dose Route Frequency Provider Last Rate Last Admin   0.9 %  sodium chloride infusion (Manually program via Guardrails IV Fluids)  250 mL Intravenous Once Truitt Merle, MD       heparin lock flush 100 unit/mL  250 Units Intracatheter Once PRN Truitt Merle, MD       oxyCODONE (Oxy IR/ROXICODONE) immediate release tablet 10 mg  10 mg Oral Once Truitt Merle, MD       romiPLOStim (NPLATE) injection 660 mcg  10 mcg/kg Subcutaneous Once Truitt Merle, MD       sodium chloride  flush (NS) 0.9 % injection 10 mL  10 mL Intracatheter PRN Truitt Merle, MD        PHYSICAL EXAMINATION: ECOG PERFORMANCE STATUS: 2 - Symptomatic, <50% confined to bed  There were no vitals filed for this visit. Wt Readings from Last 3 Encounters:  04/26/21 146 lb (66.2 kg)  04/22/21 145 lb (65.8 kg)  04/12/21 146 lb 14.4 oz (66.6 kg)     GENERAL:alert, no distress and comfortable SKIN: skin color normal, no rashes or significant lesions EYES: normal, Conjunctiva are pink and non-injected, sclera clear  NEURO: alert & oriented x 3 with fluent speech  LABORATORY DATA:  I have reviewed the data as listed CBC Latest Ref Rng & Units 04/26/2021 04/22/2021 04/15/2021  WBC 4.0 - 10.5 K/uL 5.5 6.2 17.8(H)  Hemoglobin 12.0 - 15.0 g/dL 8.5(L) 8.4(L) 9.1(L)  Hematocrit 36.0 - 46.0 % 26.9(L) 26.5(L) 28.7(L)  Platelets 150 - 400 K/uL 5(LL) 15(L) 33(L)     CMP Latest Ref Rng & Units 04/26/2021 04/22/2021 04/15/2021  Glucose 70 - 99 mg/dL 167(H) 153(H) 164(H)  BUN 8 - 23 mg/dL $Remove'17 17 15   'XOeKwLB$ Creatinine 0.44 - 1.00 mg/dL 0.77 0.75 0.75  Sodium 135 - 145 mmol/L 139 140 139  Potassium 3.5 - 5.1 mmol/L 4.4 4.3 4.4  Chloride 98 - 111 mmol/L 107 107 106  CO2 22 - 32 mmol/L $RemoveB'24 24 26  'LCVWhBgR$ Calcium 8.9 - 10.3 mg/dL 8.6(L) 8.4(L) 8.5(L)  Total Protein 6.5 - 8.1 g/dL 7.2 6.9 7.0  Total Bilirubin 0.3 - 1.2 mg/dL 0.8 0.8 0.6  Alkaline Phos 38 - 126 U/L 142(H) 154(H) 167(H)  AST 15 - 41 U/L 14(L) 21 21  ALT 0 - 44 U/L $Remo'13 21 22      'zpDFk$ RADIOGRAPHIC STUDIES: I have personally reviewed the radiological images as listed and agreed with the findings in the report. No results found.    No orders of the defined types were placed in this encounter.  All questions were answered. The patient knows to call the clinic with any problems, questions or concerns. No barriers to learning was detected. The total time spent in the appointment was 30 minutes.     Truitt Merle, MD 04/26/2021   I, Wilburn Mylar, am acting as scribe for Truitt Merle, MD.   I have reviewed the above documentation for accuracy and completeness, and I agree with the above.

## 2021-04-26 NOTE — Progress Notes (Signed)
Plts 5 today.  Placed for for 1unit of Plts.  Will recheck labs on Thurday 04/29/2021.

## 2021-04-26 NOTE — Telephone Encounter (Signed)
Patient refill.  [facility] [address]  Oxycodone HCl 10 MG TABS [[id number]]

## 2021-04-26 NOTE — Patient Instructions (Signed)
[facility]  Discharge Instructions: Thank you for choosing [facility] to provide your oncology and hematology care.   If you have a lab appointment with the [facility], please go directly to the [facility] and check in at the registration area.   Wear comfortable clothing and clothing appropriate for easy access to any Portacath or PICC line.   We strive to give you quality time with your provider. You may need to reschedule your appointment if you arrive late (15 or more minutes).  Arriving late affects you and other patients whose appointments are after yours.  Also, if you miss three or more appointments without notifying the office, you may be dismissed from the clinic at the provider's discretion.      For prescription refill requests, have your pharmacy contact our office and allow 72 hours for refills to be completed.    Today you received the following chemotherapy and/or immunotherapy agents nplate, one unit platelets, and vidaza today.   To help prevent nausea and vomiting after your treatment, we encourage you to take your nausea medication as directed.  BELOW ARE SYMPTOMS THAT SHOULD BE REPORTED IMMEDIATELY: *FEVER GREATER THAN 100.4 F (38 C) OR HIGHER *CHILLS OR SWEATING *NAUSEA AND VOMITING THAT IS NOT CONTROLLED WITH YOUR NAUSEA MEDICATION *UNUSUAL SHORTNESS OF BREATH *UNUSUAL BRUISING OR BLEEDING *URINARY PROBLEMS (pain or burning when urinating, or frequent urination) *BOWEL PROBLEMS (unusual diarrhea, constipation, pain near the anus) TENDERNESS IN MOUTH AND THROAT WITH OR WITHOUT PRESENCE OF ULCERS (sore throat, sores in mouth, or a toothache) UNUSUAL RASH, SWELLING OR PAIN  UNUSUAL VAGINAL DISCHARGE OR ITCHING   Items with * indicate a potential emergency and should be followed up as soon as possible or go to the Emergency Department if any problems should occur.  Please show the CHEMOTHERAPY ALERT CARD or  IMMUNOTHERAPY ALERT CARD at check-in to the Emergency Department and triage nurse.  Should you have questions after your visit or need to cancel or reschedule your appointment, please contact [facility]  Dept: [phone]  and follow the prompts.  Office hours are 8:00 a.m. to 4:30 p.m. Monday - Friday. Please note that voicemails left after 4:00 p.m. may not be returned until the following business day.  We are closed weekends and major holidays. You have access to a nurse at all times for urgent questions. Please call the main number to the clinic Dept: [phone] and follow the prompts.   For any non-urgent questions, you may also contact your provider using MyChart. We now offer e-Visits for anyone 35 and older to request care online for non-urgent symptoms. For details visit [email or web address].   Also download the MyChart app! Go to the app store, search "MyChart", open the app, select [facility], and log in with your MyChart username and password.  Due to Covid, a mask is required upon entering the hospital/clinic. If you do not have a mask, one will be given to you upon arrival. For doctor visits, patients may have 1 support person aged 32 or older with them. For treatment visits, patients cannot have anyone with them due to current Covid guidelines and our immunocompromised population.

## 2021-04-27 ENCOUNTER — Inpatient Hospital Stay: Payer: Medicare HMO

## 2021-04-27 ENCOUNTER — Encounter: Payer: Self-pay | Admitting: Hematology

## 2021-04-27 ENCOUNTER — Ambulatory Visit: Payer: Medicare HMO | Admitting: Hematology

## 2021-04-27 ENCOUNTER — Other Ambulatory Visit: Payer: Medicare HMO

## 2021-04-27 ENCOUNTER — Encounter: Payer: Self-pay | Admitting: Family Medicine

## 2021-04-27 VITALS — BP 123/63 | HR 91 | Temp 97.9°F | Resp 18

## 2021-04-27 DIAGNOSIS — E538 Deficiency of other specified B group vitamins: Secondary | ICD-10-CM | POA: Diagnosis not present

## 2021-04-27 DIAGNOSIS — C9310 Chronic myelomonocytic leukemia not having achieved remission: Secondary | ICD-10-CM | POA: Diagnosis not present

## 2021-04-27 DIAGNOSIS — Z5111 Encounter for antineoplastic chemotherapy: Secondary | ICD-10-CM | POA: Diagnosis not present

## 2021-04-27 DIAGNOSIS — D693 Immune thrombocytopenic purpura: Secondary | ICD-10-CM | POA: Diagnosis not present

## 2021-04-27 DIAGNOSIS — E1142 Type 2 diabetes mellitus with diabetic polyneuropathy: Secondary | ICD-10-CM | POA: Diagnosis not present

## 2021-04-27 DIAGNOSIS — I10 Essential (primary) hypertension: Secondary | ICD-10-CM | POA: Diagnosis not present

## 2021-04-27 DIAGNOSIS — Z79899 Other long term (current) drug therapy: Secondary | ICD-10-CM | POA: Diagnosis not present

## 2021-04-27 DIAGNOSIS — R519 Headache, unspecified: Secondary | ICD-10-CM | POA: Diagnosis not present

## 2021-04-27 DIAGNOSIS — R69 Illness, unspecified: Secondary | ICD-10-CM | POA: Diagnosis not present

## 2021-04-27 DIAGNOSIS — D649 Anemia, unspecified: Secondary | ICD-10-CM | POA: Diagnosis not present

## 2021-04-27 LAB — BPAM PLATELET PHERESIS
Blood Product Expiration Date: 202211172359
ISSUE DATE / TIME: 202211141034
Unit Type and Rh: 5100

## 2021-04-27 LAB — PREPARE PLATELET PHERESIS: Unit division: 0

## 2021-04-27 MED ORDER — SODIUM CHLORIDE 0.9 % IV SOLN
10.0000 mg | Freq: Once | INTRAVENOUS | Status: AC
  Administered 2021-04-27: 10 mg via INTRAVENOUS
  Filled 2021-04-27: qty 10

## 2021-04-27 MED ORDER — SODIUM CHLORIDE 0.9 % IV SOLN
75.0000 mg/m2 | Freq: Once | INTRAVENOUS | Status: AC
  Administered 2021-04-27: 135 mg via INTRAVENOUS
  Filled 2021-04-27: qty 13.5

## 2021-04-27 MED ORDER — SODIUM CHLORIDE 0.9 % IV SOLN: Freq: Once | INTRAVENOUS | Status: DC

## 2021-04-27 MED FILL — Dexamethasone Sodium Phosphate Inj 100 MG/10ML: INTRAMUSCULAR | Qty: 1 | Status: AC

## 2021-04-27 NOTE — Telephone Encounter (Signed)
Pt sched for next avail 12/5. Please place pt on waitlist.

## 2021-04-27 NOTE — Telephone Encounter (Signed)
It is never ok to refill a controlled substance without an appt- by [place] law.  We would need to see her, in person since this is an opiate class medication, in order to consider refill for her. I know her expected back procedure was unfortunately canceled.  I would be happy to help her with her pain, but by law I have to see her in person.

## 2021-04-27 NOTE — Patient Instructions (Signed)
[facility]  Discharge Instructions: Thank you for choosing [facility] to provide your oncology and hematology care.   If you have a lab appointment with the [facility], please go directly to the [facility] and check in at the registration area.   Wear comfortable clothing and clothing appropriate for easy access to any Portacath or PICC line.   We strive to give you quality time with your provider. You may need to reschedule your appointment if you arrive late (15 or more minutes).  Arriving late affects you and other patients whose appointments are after yours.  Also, if you miss three or more appointments without notifying the office, you may be dismissed from the clinic at the provider's discretion.      For prescription refill requests, have your pharmacy contact our office and allow 72 hours for refills to be completed.    Today you received the following chemotherapy and/or immunotherapy agents avacitadine    To help prevent nausea and vomiting after your treatment, we encourage you to take your nausea medication as directed.  BELOW ARE SYMPTOMS THAT SHOULD BE REPORTED IMMEDIATELY: *FEVER GREATER THAN 100.4 F (38 C) OR HIGHER *CHILLS OR SWEATING *NAUSEA AND VOMITING THAT IS NOT CONTROLLED WITH YOUR NAUSEA MEDICATION *UNUSUAL SHORTNESS OF BREATH *UNUSUAL BRUISING OR BLEEDING *URINARY PROBLEMS (pain or burning when urinating, or frequent urination) *BOWEL PROBLEMS (unusual diarrhea, constipation, pain near the anus) TENDERNESS IN MOUTH AND THROAT WITH OR WITHOUT PRESENCE OF ULCERS (sore throat, sores in mouth, or a toothache) UNUSUAL RASH, SWELLING OR PAIN  UNUSUAL VAGINAL DISCHARGE OR ITCHING   Items with * indicate a potential emergency and should be followed up as soon as possible or go to the Emergency Department if any problems should occur.  Please show the CHEMOTHERAPY ALERT CARD or IMMUNOTHERAPY ALERT CARD at check-in to  the Emergency Department and triage nurse.  Should you have questions after your visit or need to cancel or reschedule your appointment, please contact [facility]  Dept: [phone]  and follow the prompts.  Office hours are 8:00 a.m. to 4:30 p.m. Monday - Friday. Please note that voicemails left after 4:00 p.m. may not be returned until the following business day.  We are closed weekends and major holidays. You have access to a nurse at all times for urgent questions. Please call the main number to the clinic Dept: [phone] and follow the prompts.   For any non-urgent questions, you may also contact your provider using MyChart. We now offer e-Visits for anyone 67 and older to request care online for non-urgent symptoms. For details visit [email or web address].   Also download the MyChart app! Go to the app store, search "MyChart", open the app, select [facility], and log in with your MyChart username and password.  Due to Covid, a mask is required upon entering the hospital/clinic. If you do not have a mask, one will be given to you upon arrival. For doctor visits, patients may have 1 support person aged 38 or older with them. For treatment visits, patients cannot have anyone with them due to current Covid guidelines and our immunocompromised population.

## 2021-04-28 ENCOUNTER — Telehealth: Payer: Self-pay

## 2021-04-28 ENCOUNTER — Other Ambulatory Visit: Payer: Self-pay

## 2021-04-28 ENCOUNTER — Inpatient Hospital Stay: Payer: Medicare HMO

## 2021-04-28 ENCOUNTER — Other Ambulatory Visit: Payer: Self-pay | Admitting: Hematology

## 2021-04-28 ENCOUNTER — Other Ambulatory Visit: Payer: Medicare HMO

## 2021-04-28 ENCOUNTER — Other Ambulatory Visit: Payer: Self-pay | Admitting: Nurse Practitioner

## 2021-04-28 VITALS — BP 113/65 | HR 89 | Temp 98.2°F | Resp 17

## 2021-04-28 DIAGNOSIS — C9310 Chronic myelomonocytic leukemia not having achieved remission: Secondary | ICD-10-CM | POA: Diagnosis not present

## 2021-04-28 DIAGNOSIS — R519 Headache, unspecified: Secondary | ICD-10-CM | POA: Diagnosis not present

## 2021-04-28 DIAGNOSIS — I10 Essential (primary) hypertension: Secondary | ICD-10-CM | POA: Diagnosis not present

## 2021-04-28 DIAGNOSIS — E1142 Type 2 diabetes mellitus with diabetic polyneuropathy: Secondary | ICD-10-CM | POA: Diagnosis not present

## 2021-04-28 DIAGNOSIS — D696 Thrombocytopenia, unspecified: Secondary | ICD-10-CM

## 2021-04-28 DIAGNOSIS — D649 Anemia, unspecified: Secondary | ICD-10-CM | POA: Diagnosis not present

## 2021-04-28 DIAGNOSIS — E538 Deficiency of other specified B group vitamins: Secondary | ICD-10-CM | POA: Diagnosis not present

## 2021-04-28 DIAGNOSIS — Z79899 Other long term (current) drug therapy: Secondary | ICD-10-CM | POA: Diagnosis not present

## 2021-04-28 DIAGNOSIS — R69 Illness, unspecified: Secondary | ICD-10-CM | POA: Diagnosis not present

## 2021-04-28 DIAGNOSIS — Z5111 Encounter for antineoplastic chemotherapy: Secondary | ICD-10-CM | POA: Diagnosis not present

## 2021-04-28 DIAGNOSIS — D693 Immune thrombocytopenic purpura: Secondary | ICD-10-CM | POA: Diagnosis not present

## 2021-04-28 LAB — CBC WITH DIFFERENTIAL (CANCER CENTER ONLY)
Abs Immature Granulocytes: 0.05 10*3/uL (ref 0.00–0.07)
Basophils Absolute: 0 10*3/uL (ref 0.0–0.1)
Basophils Relative: 1 %
Eosinophils Absolute: 0 10*3/uL (ref 0.0–0.5)
Eosinophils Relative: 0 %
HCT: 26.4 % — ABNORMAL LOW (ref 36.0–46.0)
Hemoglobin: 8.1 g/dL — ABNORMAL LOW (ref 12.0–15.0)
Immature Granulocytes: 1 %
Lymphocytes Relative: 13 %
Lymphs Abs: 0.7 10*3/uL (ref 0.7–4.0)
MCH: 26.7 pg (ref 26.0–34.0)
MCHC: 30.7 g/dL (ref 30.0–36.0)
MCV: 87.1 fL (ref 80.0–100.0)
Monocytes Absolute: 1.1 10*3/uL — ABNORMAL HIGH (ref 0.1–1.0)
Monocytes Relative: 20 %
Neutro Abs: 3.5 10*3/uL (ref 1.7–7.7)
Neutrophils Relative %: 65 %
Platelet Count: 8 10*3/uL — CL (ref 150–400)
RBC: 3.03 MIL/uL — ABNORMAL LOW (ref 3.87–5.11)
RDW: 17.4 % — ABNORMAL HIGH (ref 11.5–15.5)
WBC Count: 5.3 10*3/uL (ref 4.0–10.5)
nRBC: 0 % (ref 0.0–0.2)

## 2021-04-28 LAB — SAMPLE TO BLOOD BANK

## 2021-04-28 MED ORDER — SODIUM CHLORIDE 0.9 % IV SOLN
75.0000 mg/m2 | Freq: Once | INTRAVENOUS | Status: AC
  Administered 2021-04-28: 135 mg via INTRAVENOUS
  Filled 2021-04-28: qty 13.5

## 2021-04-28 MED ORDER — SODIUM CHLORIDE 0.9% IV SOLUTION
250.0000 mL | Freq: Once | INTRAVENOUS | Status: AC
  Administered 2021-04-28: 250 mL via INTRAVENOUS

## 2021-04-28 MED ORDER — SODIUM CHLORIDE 0.9% FLUSH
10.0000 mL | INTRAVENOUS | Status: DC | PRN
  Administered 2021-04-28: 10 mL

## 2021-04-28 MED ORDER — SODIUM CHLORIDE 0.9 % IV SOLN
10.0000 mg | Freq: Once | INTRAVENOUS | Status: AC
  Administered 2021-04-28: 10 mg via INTRAVENOUS
  Filled 2021-04-28: qty 10

## 2021-04-28 MED ORDER — OXYCODONE HCL 10 MG PO TABS: 10.0000 mg | ORAL_TABLET | Freq: Four times a day (QID) | ORAL | 0 refills | Status: DC

## 2021-04-28 MED ORDER — SODIUM CHLORIDE 0.9 % IV SOLN: Freq: Once | INTRAVENOUS | Status: AC

## 2021-04-28 MED ORDER — HEPARIN SOD (PORK) LOCK FLUSH 100 UNIT/ML IV SOLN
500.0000 [IU] | Freq: Once | INTRAVENOUS | Status: AC | PRN
  Administered 2021-04-28: 500 [IU]

## 2021-04-28 MED ORDER — PALONOSETRON HCL INJECTION 0.25 MG/5ML: 0.2500 mg | Freq: Once | INTRAVENOUS | Status: DC

## 2021-04-28 NOTE — Telephone Encounter (Signed)
CRITICAL VALUE STICKER  CRITICAL VALUE:  PLT =8  RECEIVER (on-site recipient of call): Raymund Manrique P. LPN  DATE & TIME NOTIFIED: 04/28/21 9:26 am  MESSENGER (representative from lab): Lauren   MD NOTIFIED: Dr. Burr Medico   TIME OF NOTIFICATION:9:32am

## 2021-04-28 NOTE — Telephone Encounter (Signed)
Pt was also informed she can not go to other providers due to being a controlled substance during call. Please advise.

## 2021-04-28 NOTE — Telephone Encounter (Signed)
We work her in tomorrow At 1145.virtual ok.

## 2021-04-28 NOTE — Progress Notes (Signed)
1 unit of Random Plts ordered and verified by [facility] in Blood bank.  Beth in Blood Bank will order HLA matched Plts for Friday since the pt will be going out of town for the holidays.

## 2021-04-28 NOTE — Telephone Encounter (Signed)
Attempted to contact pt in regards to scheduling a possible appt. LVM for pt CB.

## 2021-04-28 NOTE — Patient Instructions (Signed)
[facility]  Discharge Instructions: Thank you for choosing [facility] to provide your oncology and hematology care.   If you have a lab appointment with the Cancer Center, please go directly to the Cancer Center and check in at the registration area.   Wear comfortable clothing and clothing appropriate for easy access to any Portacath or PICC line.   We strive to give you quality time with your provider. You may need to reschedule your appointment if you arrive late (15 or more minutes).  Arriving late affects you and other patients whose appointments are after yours.  Also, if you miss three or more appointments without notifying the office, you may be dismissed from the clinic at the provider's discretion.      For prescription refill requests, have your pharmacy contact our office and allow 72 hours for refills to be completed.   Today you received the following chemotherapy and/or immunotherapy agents Vidaza     To help prevent nausea and vomiting after your treatment, we encourage you to take your nausea medication as directed.  BELOW ARE SYMPTOMS THAT SHOULD BE REPORTED IMMEDIATELY: *FEVER GREATER THAN 100.4 F (38 C) OR HIGHER *CHILLS OR SWEATING *NAUSEA AND VOMITING THAT IS NOT CONTROLLED WITH YOUR NAUSEA MEDICATION *UNUSUAL SHORTNESS OF BREATH *UNUSUAL BRUISING OR BLEEDING *URINARY PROBLEMS (pain or burning when urinating, or frequent urination) *BOWEL PROBLEMS (unusual diarrhea, constipation, pain near the anus) TENDERNESS IN MOUTH AND THROAT WITH OR WITHOUT PRESENCE OF ULCERS (sore throat, sores in mouth, or a toothache) UNUSUAL RASH, SWELLING OR PAIN  UNUSUAL VAGINAL DISCHARGE OR ITCHING   Items with * indicate a potential emergency and should be followed up as soon as possible or go to the Emergency Department if any problems should occur.  Please show the CHEMOTHERAPY ALERT CARD or IMMUNOTHERAPY ALERT CARD at check-in to the  Emergency Department and triage nurse.  Should you have questions after your visit or need to cancel or reschedule your appointment, please contact [facility]  Dept: [phone]  and follow the prompts.  Office hours are 8:00 a.m. to 4:30 p.m. Monday - Friday. Please note that voicemails left after 4:00 p.m. may not be returned until the following business day.  We are closed weekends and major holidays. You have access to a nurse at all times for urgent questions. Please call the main number to the clinic Dept: [phone] and follow the prompts.   For any non-urgent questions, you may also contact your provider using MyChart. We now offer e-Visits for anyone 18 and older to request care online for non-urgent symptoms. For details visit [email or web address].   Also download the MyChart app! Go to the app store, search "MyChart", open the app, select , and log in with your MyChart username and password.  Due to Covid, a mask is required upon entering the hospital/clinic. If you do not have a mask, one will be given to you upon arrival. For doctor visits, patients may have 1 support person aged 18 or older with them. For treatment visits, patients cannot have anyone with them due to current Covid guidelines and our immunocompromised population.   

## 2021-04-29 ENCOUNTER — Ambulatory Visit: Payer: Medicare HMO

## 2021-04-29 ENCOUNTER — Other Ambulatory Visit: Payer: Medicare HMO

## 2021-04-29 ENCOUNTER — Inpatient Hospital Stay: Payer: Medicare HMO

## 2021-04-29 ENCOUNTER — Other Ambulatory Visit: Payer: Self-pay

## 2021-04-29 VITALS — BP 135/72 | HR 98 | Temp 98.3°F | Resp 18

## 2021-04-29 DIAGNOSIS — E538 Deficiency of other specified B group vitamins: Secondary | ICD-10-CM | POA: Diagnosis not present

## 2021-04-29 DIAGNOSIS — D693 Immune thrombocytopenic purpura: Secondary | ICD-10-CM | POA: Diagnosis not present

## 2021-04-29 DIAGNOSIS — R69 Illness, unspecified: Secondary | ICD-10-CM | POA: Diagnosis not present

## 2021-04-29 DIAGNOSIS — C9310 Chronic myelomonocytic leukemia not having achieved remission: Secondary | ICD-10-CM | POA: Diagnosis not present

## 2021-04-29 DIAGNOSIS — D649 Anemia, unspecified: Secondary | ICD-10-CM | POA: Diagnosis not present

## 2021-04-29 DIAGNOSIS — R519 Headache, unspecified: Secondary | ICD-10-CM | POA: Diagnosis not present

## 2021-04-29 DIAGNOSIS — I10 Essential (primary) hypertension: Secondary | ICD-10-CM | POA: Diagnosis not present

## 2021-04-29 DIAGNOSIS — Z79899 Other long term (current) drug therapy: Secondary | ICD-10-CM | POA: Diagnosis not present

## 2021-04-29 DIAGNOSIS — Z5111 Encounter for antineoplastic chemotherapy: Secondary | ICD-10-CM | POA: Diagnosis not present

## 2021-04-29 DIAGNOSIS — E1142 Type 2 diabetes mellitus with diabetic polyneuropathy: Secondary | ICD-10-CM | POA: Diagnosis not present

## 2021-04-29 LAB — BPAM PLATELET PHERESIS
Blood Product Expiration Date: 202211192359
ISSUE DATE / TIME: 202211161057
Unit Type and Rh: 5100

## 2021-04-29 LAB — PREPARE PLATELET PHERESIS: Unit division: 0

## 2021-04-29 MED ORDER — SODIUM CHLORIDE 0.9 % IV SOLN
75.0000 mg/m2 | Freq: Once | INTRAVENOUS | Status: AC
  Administered 2021-04-29: 09:00:00 135 mg via INTRAVENOUS
  Filled 2021-04-29: qty 13.5

## 2021-04-29 MED ORDER — SODIUM CHLORIDE 0.9% FLUSH
3.0000 mL | INTRAVENOUS | Status: DC | PRN
  Administered 2021-04-29: 10:00:00 3 mL

## 2021-04-29 MED ORDER — HEPARIN SOD (PORK) LOCK FLUSH 100 UNIT/ML IV SOLN
250.0000 [IU] | Freq: Once | INTRAVENOUS | Status: AC | PRN
  Administered 2021-04-29: 10:00:00 250 [IU]

## 2021-04-29 MED ORDER — SODIUM CHLORIDE 0.9 % IV SOLN
10.0000 mg | Freq: Once | INTRAVENOUS | Status: AC
  Administered 2021-04-29: 08:00:00 10 mg via INTRAVENOUS
  Filled 2021-04-29: qty 10

## 2021-04-29 MED ORDER — SODIUM CHLORIDE 0.9 % IV SOLN
Freq: Once | INTRAVENOUS | Status: AC
Start: 2021-04-29 — End: 2021-04-29

## 2021-04-29 NOTE — Progress Notes (Signed)
Attestation sent to provider.

## 2021-04-29 NOTE — Patient Instructions (Signed)
[facility]  Discharge Instructions: Thank you for choosing [facility] to provide your oncology and hematology care.   If you have a lab appointment with the [facility], please go directly to the [facility] and check in at the registration area.   Wear comfortable clothing and clothing appropriate for easy access to any Portacath or PICC line.   We strive to give you quality time with your provider. You may need to reschedule your appointment if you arrive late (15 or more minutes).  Arriving late affects you and other patients whose appointments are after yours.  Also, if you miss three or more appointments without notifying the office, you may be dismissed from the clinic at the provider's discretion.      For prescription refill requests, have your pharmacy contact our office and allow 72 hours for refills to be completed.    Today you received the following chemotherapy and/or immunotherapy agents avacitadine    To help prevent nausea and vomiting after your treatment, we encourage you to take your nausea medication as directed.  BELOW ARE SYMPTOMS THAT SHOULD BE REPORTED IMMEDIATELY: *FEVER GREATER THAN 100.4 F (38 C) OR HIGHER *CHILLS OR SWEATING *NAUSEA AND VOMITING THAT IS NOT CONTROLLED WITH YOUR NAUSEA MEDICATION *UNUSUAL SHORTNESS OF BREATH *UNUSUAL BRUISING OR BLEEDING *URINARY PROBLEMS (pain or burning when urinating, or frequent urination) *BOWEL PROBLEMS (unusual diarrhea, constipation, pain near the anus) TENDERNESS IN MOUTH AND THROAT WITH OR WITHOUT PRESENCE OF ULCERS (sore throat, sores in mouth, or a toothache) UNUSUAL RASH, SWELLING OR PAIN  UNUSUAL VAGINAL DISCHARGE OR ITCHING   Items with * indicate a potential emergency and should be followed up as soon as possible or go to the Emergency Department if any problems should occur.  Please show the CHEMOTHERAPY ALERT CARD or IMMUNOTHERAPY ALERT CARD at check-in to  the Emergency Department and triage nurse.  Should you have questions after your visit or need to cancel or reschedule your appointment, please contact [facility]  Dept: [phone]  and follow the prompts.  Office hours are 8:00 a.m. to 4:30 p.m. Monday - Friday. Please note that voicemails left after 4:00 p.m. may not be returned until the following business day.  We are closed weekends and major holidays. You have access to a nurse at all times for urgent questions. Please call the main number to the clinic Dept: [phone] and follow the prompts.   For any non-urgent questions, you may also contact your provider using MyChart. We now offer e-Visits for anyone 26 and older to request care online for non-urgent symptoms. For details visit [email or web address].   Also download the MyChart app! Go to the app store, search "MyChart", open the app, select [facility], and log in with your MyChart username and password.  Due to Covid, a mask is required upon entering the hospital/clinic. If you do not have a mask, one will be given to you upon arrival. For doctor visits, patients may have 1 support person aged 30 or older with them. For treatment visits, patients cannot have anyone with them due to current Covid guidelines and our immunocompromised population.

## 2021-04-30 ENCOUNTER — Other Ambulatory Visit: Payer: Medicare HMO

## 2021-04-30 ENCOUNTER — Other Ambulatory Visit: Payer: Self-pay

## 2021-04-30 ENCOUNTER — Inpatient Hospital Stay: Payer: Medicare HMO

## 2021-04-30 ENCOUNTER — Ambulatory Visit: Payer: Medicare HMO

## 2021-04-30 VITALS — BP 109/72 | HR 98 | Temp 98.2°F | Resp 18

## 2021-04-30 DIAGNOSIS — Z5111 Encounter for antineoplastic chemotherapy: Secondary | ICD-10-CM | POA: Diagnosis not present

## 2021-04-30 DIAGNOSIS — Z79899 Other long term (current) drug therapy: Secondary | ICD-10-CM | POA: Diagnosis not present

## 2021-04-30 DIAGNOSIS — D469 Myelodysplastic syndrome, unspecified: Secondary | ICD-10-CM

## 2021-04-30 DIAGNOSIS — R519 Headache, unspecified: Secondary | ICD-10-CM | POA: Diagnosis not present

## 2021-04-30 DIAGNOSIS — D693 Immune thrombocytopenic purpura: Secondary | ICD-10-CM

## 2021-04-30 DIAGNOSIS — E1142 Type 2 diabetes mellitus with diabetic polyneuropathy: Secondary | ICD-10-CM | POA: Diagnosis not present

## 2021-04-30 DIAGNOSIS — R69 Illness, unspecified: Secondary | ICD-10-CM | POA: Diagnosis not present

## 2021-04-30 DIAGNOSIS — I10 Essential (primary) hypertension: Secondary | ICD-10-CM | POA: Diagnosis not present

## 2021-04-30 DIAGNOSIS — C9310 Chronic myelomonocytic leukemia not having achieved remission: Secondary | ICD-10-CM | POA: Diagnosis not present

## 2021-04-30 DIAGNOSIS — Z95828 Presence of other vascular implants and grafts: Secondary | ICD-10-CM

## 2021-04-30 DIAGNOSIS — D696 Thrombocytopenia, unspecified: Secondary | ICD-10-CM

## 2021-04-30 DIAGNOSIS — D649 Anemia, unspecified: Secondary | ICD-10-CM | POA: Diagnosis not present

## 2021-04-30 DIAGNOSIS — E538 Deficiency of other specified B group vitamins: Secondary | ICD-10-CM | POA: Diagnosis not present

## 2021-04-30 LAB — CMP (CANCER CENTER ONLY)
ALT: 14 U/L (ref 0–44)
AST: 12 U/L — ABNORMAL LOW (ref 15–41)
Albumin: 3.4 g/dL — ABNORMAL LOW (ref 3.5–5.0)
Alkaline Phosphatase: 128 U/L — ABNORMAL HIGH (ref 38–126)
Anion gap: 9 (ref 5–15)
BUN: 20 mg/dL (ref 8–23)
CO2: 25 mmol/L (ref 22–32)
Calcium: 8.9 mg/dL (ref 8.9–10.3)
Chloride: 104 mmol/L (ref 98–111)
Creatinine: 0.81 mg/dL (ref 0.44–1.00)
GFR, Estimated: >60 mL/min (ref >60)
Glucose, Bld: 198 mg/dL — ABNORMAL HIGH (ref 70–99)
Potassium: 3.7 mmol/L (ref 3.5–5.1)
Sodium: 138 mmol/L (ref 135–145)
Total Bilirubin: 0.6 mg/dL (ref 0.3–1.2)
Total Protein: 7 g/dL (ref 6.5–8.1)

## 2021-04-30 LAB — SAMPLE TO BLOOD BANK

## 2021-04-30 LAB — CBC WITH DIFFERENTIAL (CANCER CENTER ONLY)
Abs Immature Granulocytes: 0.08 10*3/uL — ABNORMAL HIGH (ref 0.00–0.07)
Basophils Absolute: 0 10*3/uL (ref 0.0–0.1)
Basophils Relative: 1 %
Eosinophils Absolute: 0 10*3/uL (ref 0.0–0.5)
Eosinophils Relative: 0 %
HCT: 27 % — ABNORMAL LOW (ref 36.0–46.0)
Hemoglobin: 8.3 g/dL — ABNORMAL LOW (ref 12.0–15.0)
Immature Granulocytes: 1 %
Lymphocytes Relative: 16 %
Lymphs Abs: 1 10*3/uL (ref 0.7–4.0)
MCH: 26.3 pg (ref 26.0–34.0)
MCHC: 30.7 g/dL (ref 30.0–36.0)
MCV: 85.4 fL (ref 80.0–100.0)
Monocytes Absolute: 1 10*3/uL (ref 0.1–1.0)
Monocytes Relative: 18 %
Neutro Abs: 3.7 10*3/uL (ref 1.7–7.7)
Neutrophils Relative %: 64 %
Platelet Count: 15 10*3/uL — ABNORMAL LOW (ref 150–400)
RBC: 3.16 MIL/uL — ABNORMAL LOW (ref 3.87–5.11)
RDW: 17.2 % — ABNORMAL HIGH (ref 11.5–15.5)
WBC Count: 5.8 10*3/uL (ref 4.0–10.5)
nRBC: 0 % (ref 0.0–0.2)

## 2021-04-30 MED ORDER — SODIUM CHLORIDE 0.9% FLUSH
10.0000 mL | INTRAVENOUS | Status: AC | PRN
  Administered 2021-04-30: 10 mL

## 2021-04-30 MED ORDER — PALONOSETRON HCL INJECTION 0.25 MG/5ML
0.2500 mg | Freq: Once | INTRAVENOUS | Status: AC
  Administered 2021-04-30: 0.25 mg via INTRAVENOUS
  Filled 2021-04-30: qty 5

## 2021-04-30 MED ORDER — SODIUM CHLORIDE 0.9 % IV SOLN: Freq: Once | INTRAVENOUS | Status: AC

## 2021-04-30 MED ORDER — SODIUM CHLORIDE 0.9 % IV SOLN
75.0000 mg/m2 | Freq: Once | INTRAVENOUS | Status: AC
  Administered 2021-04-30: 135 mg via INTRAVENOUS
  Filled 2021-04-30: qty 13.5

## 2021-04-30 MED ORDER — SODIUM CHLORIDE 0.9 % IV SOLN
10.0000 mg | Freq: Once | INTRAVENOUS | Status: AC
  Administered 2021-04-30: 10 mg via INTRAVENOUS
  Filled 2021-04-30: qty 10

## 2021-05-03 DIAGNOSIS — C9310 Chronic myelomonocytic leukemia not having achieved remission: Secondary | ICD-10-CM | POA: Diagnosis not present

## 2021-05-03 LAB — PREPARE PLATELET PHERESIS: Unit division: 0

## 2021-05-03 LAB — BPAM PLATELET PHERESIS
Blood Product Expiration Date: 202211202359
ISSUE DATE / TIME: 202211181008
Unit Type and Rh: 5100

## 2021-05-04 ENCOUNTER — Telehealth: Payer: Self-pay | Admitting: Hematology

## 2021-05-04 NOTE — Telephone Encounter (Signed)
Left message with follow-up appointments per 11/14 los. 

## 2021-05-05 DIAGNOSIS — C9310 Chronic myelomonocytic leukemia not having achieved remission: Secondary | ICD-10-CM | POA: Diagnosis not present

## 2021-05-07 ENCOUNTER — Other Ambulatory Visit: Payer: Self-pay

## 2021-05-07 DIAGNOSIS — C9310 Chronic myelomonocytic leukemia not having achieved remission: Secondary | ICD-10-CM

## 2021-05-07 NOTE — Progress Notes (Signed)
Labs (CBC w/Diff, CMP, Sample To Blood Bank, and T&S) and orders for random platelets placed for possible transfusion on 05/10/2021.

## 2021-05-10 ENCOUNTER — Inpatient Hospital Stay: Payer: Medicare HMO

## 2021-05-10 ENCOUNTER — Ambulatory Visit: Payer: Medicaid Other | Admitting: Hematology

## 2021-05-10 ENCOUNTER — Other Ambulatory Visit: Payer: Self-pay | Admitting: Family Medicine

## 2021-05-10 ENCOUNTER — Telehealth: Payer: Self-pay

## 2021-05-10 NOTE — Telephone Encounter (Signed)
Called patient regarding 0730 flush appointment and 0830 infusion appointment. No answer, voicemail left and patient was instructed to call infusion clinic to reschedule.

## 2021-05-12 ENCOUNTER — Other Ambulatory Visit: Payer: Self-pay

## 2021-05-12 ENCOUNTER — Inpatient Hospital Stay: Payer: Medicare HMO

## 2021-05-12 DIAGNOSIS — Z452 Encounter for adjustment and management of vascular access device: Secondary | ICD-10-CM

## 2021-05-12 DIAGNOSIS — D693 Immune thrombocytopenic purpura: Secondary | ICD-10-CM | POA: Diagnosis not present

## 2021-05-12 DIAGNOSIS — I10 Essential (primary) hypertension: Secondary | ICD-10-CM | POA: Diagnosis not present

## 2021-05-12 DIAGNOSIS — C9310 Chronic myelomonocytic leukemia not having achieved remission: Secondary | ICD-10-CM

## 2021-05-12 DIAGNOSIS — E1142 Type 2 diabetes mellitus with diabetic polyneuropathy: Secondary | ICD-10-CM | POA: Diagnosis not present

## 2021-05-12 DIAGNOSIS — Z5111 Encounter for antineoplastic chemotherapy: Secondary | ICD-10-CM | POA: Diagnosis not present

## 2021-05-12 DIAGNOSIS — D696 Thrombocytopenia, unspecified: Secondary | ICD-10-CM

## 2021-05-12 DIAGNOSIS — R69 Illness, unspecified: Secondary | ICD-10-CM | POA: Diagnosis not present

## 2021-05-12 DIAGNOSIS — E538 Deficiency of other specified B group vitamins: Secondary | ICD-10-CM | POA: Diagnosis not present

## 2021-05-12 DIAGNOSIS — Z79899 Other long term (current) drug therapy: Secondary | ICD-10-CM | POA: Diagnosis not present

## 2021-05-12 DIAGNOSIS — R519 Headache, unspecified: Secondary | ICD-10-CM | POA: Diagnosis not present

## 2021-05-12 DIAGNOSIS — D649 Anemia, unspecified: Secondary | ICD-10-CM | POA: Diagnosis not present

## 2021-05-12 LAB — CBC WITH DIFFERENTIAL (CANCER CENTER ONLY)
Abs Immature Granulocytes: 0.27 10*3/uL — ABNORMAL HIGH (ref 0.00–0.07)
Basophils Absolute: 0.1 10*3/uL (ref 0.0–0.1)
Basophils Relative: 1 %
Eosinophils Absolute: 0 10*3/uL (ref 0.0–0.5)
Eosinophils Relative: 0 %
HCT: 28.5 % — ABNORMAL LOW (ref 36.0–46.0)
Hemoglobin: 9.1 g/dL — ABNORMAL LOW (ref 12.0–15.0)
Immature Granulocytes: 3 %
Lymphocytes Relative: 8 %
Lymphs Abs: 0.9 10*3/uL (ref 0.7–4.0)
MCH: 26.8 pg (ref 26.0–34.0)
MCHC: 31.9 g/dL (ref 30.0–36.0)
MCV: 84.1 fL (ref 80.0–100.0)
Monocytes Absolute: 1.8 10*3/uL — ABNORMAL HIGH (ref 0.1–1.0)
Monocytes Relative: 17 %
Neutro Abs: 7.7 10*3/uL (ref 1.7–7.7)
Neutrophils Relative %: 71 %
Platelet Count: 8 10*3/uL — CL (ref 150–400)
RBC: 3.39 MIL/uL — ABNORMAL LOW (ref 3.87–5.11)
RDW: 17.9 % — ABNORMAL HIGH (ref 11.5–15.5)
WBC Count: 10.8 10*3/uL — ABNORMAL HIGH (ref 4.0–10.5)
nRBC: 0 % (ref 0.0–0.2)

## 2021-05-12 LAB — CMP (CANCER CENTER ONLY)
ALT: 15 U/L (ref 0–44)
AST: 16 U/L (ref 15–41)
Albumin: 3.4 g/dL — ABNORMAL LOW (ref 3.5–5.0)
Alkaline Phosphatase: 138 U/L — ABNORMAL HIGH (ref 38–126)
Anion gap: 5 (ref 5–15)
BUN: 16 mg/dL (ref 8–23)
CO2: 26 mmol/L (ref 22–32)
Calcium: 8.5 mg/dL — ABNORMAL LOW (ref 8.9–10.3)
Chloride: 106 mmol/L (ref 98–111)
Creatinine: 0.72 mg/dL (ref 0.44–1.00)
GFR, Estimated: >60 mL/min (ref >60)
Glucose, Bld: 216 mg/dL — ABNORMAL HIGH (ref 70–99)
Potassium: 3.8 mmol/L (ref 3.5–5.1)
Sodium: 137 mmol/L (ref 135–145)
Total Bilirubin: 0.8 mg/dL (ref 0.3–1.2)
Total Protein: 6.9 g/dL (ref 6.5–8.1)

## 2021-05-12 LAB — SAMPLE TO BLOOD BANK

## 2021-05-12 MED ORDER — SODIUM CHLORIDE 0.9% IV SOLUTION
250.0000 mL | Freq: Once | INTRAVENOUS | Status: AC
  Administered 2021-05-12: 250 mL via INTRAVENOUS

## 2021-05-12 MED ORDER — SODIUM CHLORIDE 0.9% FLUSH
10.0000 mL | INTRAVENOUS | Status: AC | PRN
  Administered 2021-05-12: 10 mL

## 2021-05-12 NOTE — Progress Notes (Signed)
1 unit of HLA matched Plts ordered for Friday 05/14/2021.  Spoke with [facility] in Blood Bank to order 1 unit of HLA Platelets for transfusion on Friday.

## 2021-05-12 NOTE — Patient Instructions (Signed)
Platelet Transfusion ?A platelet transfusion is a procedure in which a person receives donated platelets through an IV. Platelets are parts of blood that stick together and form a clot to help the body stop bleeding after an injury. If you have too few platelets, your blood may have trouble clotting. This may cause you to bleed and bruise very easily. ?You may need a platelet transfusion if you have a condition that causes a low number of platelets (thrombocytopenia). A platelet transfusion may be used to stop or prevent excessive bleeding. ?Tell a health care provider about: ?Any reactions you have had during previous transfusions. ?Any allergies you have. ?All medicines you are taking, including vitamins, herbs, eye drops, creams, and over-the-counter medicines. ?Any bleeding problems you have. ?Any surgeries you have had. ?Any medical conditions you have. ?Whether you are pregnant or may be pregnant. ?What are the risks? ?Generally, this is a safe procedure. However, problems may occur, including: ?Fever. ?Infection. ?Allergic reaction to the donated (donor) platelets. ?Your body's disease-fighting system (immune system) attacking the donor platelets (hemolytic reaction). This is rare. ?A rare reaction that causes lung damage (transfusion-related acute lung injury). ?What happens before the procedure? ?Medicines ?Ask your health care provider about: ?Changing or stopping your regular medicines. This is especially important if you are taking diabetes medicines or blood thinners. ?Taking medicines such as aspirin and ibuprofen. These medicines can thin your blood. Do not take these medicines unless your health care provider tells you to take them. ?Taking over-the-counter medicines, vitamins, herbs, and supplements. ?General instructions ?You will have a blood test to determine your blood type. Your blood type determines what kind of platelets you will be given. ?Follow instructions from your health care provider  about eating or drinking restrictions. ?If you have had an allergic reaction to a transfusion in the past, you may be given medicine to help prevent a reaction. ?Your temperature, blood pressure, pulse, and breathing will be monitored. ?What happens during the procedure? ? ?An IV will be inserted into one of your veins. ?For your safety, two health care providers will verify your identity along with the donor platelets about to be infused. ?A bag of donor platelets will be connected to your IV. The platelets will flow into your bloodstream. This usually takes 30-60 minutes. ?Your temperature, blood pressure, pulse, and breathing will be monitored during the transfusion. This helps detect early signs of any reaction. ?You will also be monitored for other symptoms that may indicate a reaction, including chills, hives, or itching. ?If you have signs of a reaction at any time, your transfusion will be stopped, and you may be given medicine to help manage the reaction. ?When your transfusion is complete, your IV will be removed. ?Pressure may be applied to the IV site for a few minutes to stop any bleeding. ?The IV site will be covered with a bandage (dressing). ?The procedure may vary among health care providers and hospitals. ?What can I expect after the procedure? ?Your blood pressure, temperature, pulse, and breathing will be monitored until you leave the hospital or clinic. ?You may have some bruising and soreness at your IV site. ?Follow these instructions at home: ?Medicines ?Take over-the-counter and prescription medicines only as told by your health care provider. ?Talk with your health care provider before you take any medicines that contain aspirin or NSAIDs, such as ibuprofen. These medicines increase your risk for dangerous bleeding. ?IV site care ?Check your IV site every day for signs of infection. Check for: ?  Redness, swelling, or pain. ?Fluid or blood. If fluid or blood drains from your IV site, use your  hands to press down firmly on a bandage covering the area for a minute or two. Doing this should stop the bleeding. ?Warmth. ?Pus or a bad smell. ?General instructions ?Change or remove your dressing as told by your health care provider. ?Return to your normal activities as told by your health care provider. Ask your health care provider what activities are safe for you. ?Do not take baths, swim, or use a hot tub until your health care provider approves. Ask your health care provider if you may take showers. ?Keep all follow-up visits. This is important. ?Contact a health care provider if: ?You have a headache that does not go away with medicine. ?You have hives, rash, or itchy skin. ?You have nausea or vomiting. ?You feel unusually tired or weak. ?You have signs of infection at your IV site. ?Get help right away if: ?You have a fever or chills. ?You urinate less often than usual. ?Your urine is darker colored than normal. ?You have any of the following: ?Trouble breathing. ?Pain in your back, abdomen, or chest. ?Cool, clammy skin. ?A fast heartbeat. ?Summary ?Platelets are tiny pieces of blood cells that clump together to form a blood clot when you have an injury. If you have too few platelets, your blood may have trouble clotting. ?A platelet transfusion is a procedure in which you receive donated platelets through an IV. ?A platelet transfusion may be used to stop or prevent excessive bleeding. ?After the procedure, check your IV site every day for signs of infection. ?This information is not intended to replace advice given to you by your health care provider. Make sure you discuss any questions you have with your health care provider. ?Document Revised: 12/03/2020 Document Reviewed: 12/03/2020 ?Elsevier Patient Education ? 2022 [employer] ? ?

## 2021-05-13 ENCOUNTER — Other Ambulatory Visit: Payer: Medicaid Other

## 2021-05-13 ENCOUNTER — Other Ambulatory Visit: Payer: Self-pay

## 2021-05-13 DIAGNOSIS — C9310 Chronic myelomonocytic leukemia not having achieved remission: Secondary | ICD-10-CM

## 2021-05-13 LAB — BPAM PLATELET PHERESIS
Blood Product Expiration Date: 202212022359
ISSUE DATE / TIME: 202211301439
Unit Type and Rh: 5100

## 2021-05-13 LAB — PREPARE PLATELET PHERESIS: Unit division: 0

## 2021-05-13 NOTE — Progress Notes (Signed)
Spoke Beth in blood bank, HLA Plts are not available at this time so the pt will get Random plts for 05/14/2021 and will get HLA Plts on 05/20/2021.  Beth stated the Blood Bank are still searching for donors for the HLA Plts but should have a donor and product by 05/20/2021.  Released the prepare plt order to Blood Bank.

## 2021-05-14 ENCOUNTER — Inpatient Hospital Stay: Payer: Medicare HMO | Attending: Hematology

## 2021-05-14 ENCOUNTER — Inpatient Hospital Stay: Payer: Medicare HMO

## 2021-05-14 ENCOUNTER — Other Ambulatory Visit: Payer: Self-pay

## 2021-05-14 DIAGNOSIS — D693 Immune thrombocytopenic purpura: Secondary | ICD-10-CM | POA: Insufficient documentation

## 2021-05-14 DIAGNOSIS — C9310 Chronic myelomonocytic leukemia not having achieved remission: Secondary | ICD-10-CM | POA: Insufficient documentation

## 2021-05-14 DIAGNOSIS — D696 Thrombocytopenia, unspecified: Secondary | ICD-10-CM

## 2021-05-14 DIAGNOSIS — Z79899 Other long term (current) drug therapy: Secondary | ICD-10-CM | POA: Diagnosis not present

## 2021-05-14 DIAGNOSIS — E119 Type 2 diabetes mellitus without complications: Secondary | ICD-10-CM | POA: Diagnosis not present

## 2021-05-14 DIAGNOSIS — R69 Illness, unspecified: Secondary | ICD-10-CM | POA: Diagnosis not present

## 2021-05-14 DIAGNOSIS — Z452 Encounter for adjustment and management of vascular access device: Secondary | ICD-10-CM

## 2021-05-14 DIAGNOSIS — Z23 Encounter for immunization: Secondary | ICD-10-CM | POA: Diagnosis not present

## 2021-05-14 DIAGNOSIS — E538 Deficiency of other specified B group vitamins: Secondary | ICD-10-CM | POA: Insufficient documentation

## 2021-05-14 DIAGNOSIS — D649 Anemia, unspecified: Secondary | ICD-10-CM | POA: Diagnosis not present

## 2021-05-14 DIAGNOSIS — Z5111 Encounter for antineoplastic chemotherapy: Secondary | ICD-10-CM | POA: Diagnosis not present

## 2021-05-14 DIAGNOSIS — F410 Panic disorder [episodic paroxysmal anxiety] without agoraphobia: Secondary | ICD-10-CM | POA: Diagnosis not present

## 2021-05-14 DIAGNOSIS — I10 Essential (primary) hypertension: Secondary | ICD-10-CM | POA: Diagnosis not present

## 2021-05-14 LAB — CMP (CANCER CENTER ONLY)
ALT: 14 U/L (ref 0–44)
AST: 14 U/L — ABNORMAL LOW (ref 15–41)
Albumin: 3.2 g/dL — ABNORMAL LOW (ref 3.5–5.0)
Alkaline Phosphatase: 148 U/L — ABNORMAL HIGH (ref 38–126)
Anion gap: 7 (ref 5–15)
BUN: 16 mg/dL (ref 8–23)
CO2: 24 mmol/L (ref 22–32)
Calcium: 8 mg/dL — ABNORMAL LOW (ref 8.9–10.3)
Chloride: 107 mmol/L (ref 98–111)
Creatinine: 0.77 mg/dL (ref 0.44–1.00)
GFR, Estimated: >60 mL/min (ref >60)
Glucose, Bld: 180 mg/dL — ABNORMAL HIGH (ref 70–99)
Potassium: 4.3 mmol/L (ref 3.5–5.1)
Sodium: 138 mmol/L (ref 135–145)
Total Bilirubin: 0.7 mg/dL (ref 0.3–1.2)
Total Protein: 6.8 g/dL (ref 6.5–8.1)

## 2021-05-14 LAB — CBC WITH DIFFERENTIAL (CANCER CENTER ONLY)
Abs Immature Granulocytes: 0.13 10*3/uL — ABNORMAL HIGH (ref 0.00–0.07)
Basophils Absolute: 0.1 10*3/uL (ref 0.0–0.1)
Basophils Relative: 1 %
Eosinophils Absolute: 0 10*3/uL (ref 0.0–0.5)
Eosinophils Relative: 0 %
HCT: 28.6 % — ABNORMAL LOW (ref 36.0–46.0)
Hemoglobin: 8.8 g/dL — ABNORMAL LOW (ref 12.0–15.0)
Immature Granulocytes: 1 %
Lymphocytes Relative: 9 %
Lymphs Abs: 0.9 10*3/uL (ref 0.7–4.0)
MCH: 26.4 pg (ref 26.0–34.0)
MCHC: 30.8 g/dL (ref 30.0–36.0)
MCV: 85.9 fL (ref 80.0–100.0)
Monocytes Absolute: 2 10*3/uL — ABNORMAL HIGH (ref 0.1–1.0)
Monocytes Relative: 19 %
Neutro Abs: 7.4 10*3/uL (ref 1.7–7.7)
Neutrophils Relative %: 70 %
Platelet Count: 5 10*3/uL — CL (ref 150–400)
RBC: 3.33 MIL/uL — ABNORMAL LOW (ref 3.87–5.11)
RDW: 17.8 % — ABNORMAL HIGH (ref 11.5–15.5)
WBC Count: 10.5 10*3/uL (ref 4.0–10.5)
nRBC: 0 % (ref 0.0–0.2)

## 2021-05-14 LAB — SAMPLE TO BLOOD BANK

## 2021-05-14 MED ORDER — SODIUM CHLORIDE 0.9% FLUSH
10.0000 mL | INTRAVENOUS | Status: DC | PRN
  Administered 2021-05-14: 10 mL via INTRAVENOUS

## 2021-05-14 MED ORDER — HEPARIN SOD (PORK) LOCK FLUSH 100 UNIT/ML IV SOLN
250.0000 [IU] | INTRAVENOUS | Status: AC | PRN
  Administered 2021-05-14: 250 [IU]

## 2021-05-14 MED ORDER — SODIUM CHLORIDE 0.9% FLUSH
10.0000 mL | INTRAVENOUS | Status: AC | PRN
  Administered 2021-05-14: 10 mL

## 2021-05-14 MED ORDER — SODIUM CHLORIDE 0.9% IV SOLUTION
250.0000 mL | Freq: Once | INTRAVENOUS | Status: AC
  Administered 2021-05-14: 250 mL via INTRAVENOUS

## 2021-05-14 NOTE — Progress Notes (Signed)
Pt tolerated platelets well. Advised to call with any concerns. Pt verbalized understanding. Ambulated out of facility

## 2021-05-14 NOTE — Patient Instructions (Signed)
Blood Transfusion, Adult A blood transfusion is a procedure in which you receive blood or a type of blood cell (blood component) through an IV. You may need a blood transfusion when your blood level is low. This may result from a bleeding disorder, illness, injury, or surgery. The blood may come from a donor. You may also be able to donate blood for yourself (autologous blood donation) before a planned surgery. The blood given in a transfusion is made up of different blood components. You may receive: Red blood cells. These carry oxygen to the cells in the body. Platelets. These help your blood to clot. Plasma. This is the liquid part of your blood. It carries proteins and other substances throughout the body. White blood cells. These help you fight infections. If you have hemophilia or another clotting disorder, you may also receive other types of blood products. Tell a health care provider about: Any blood disorders you have. Any previous reactions you have had during a blood transfusion. Any allergies you have. All medicines you are taking, including vitamins, herbs, eye drops, creams, and over-the-counter medicines. Any surgeries you have had. Any medical conditions you have, including any recent fever or cold symptoms. Whether you are pregnant or may be pregnant. What are the risks? Generally, this is a safe procedure. However, problems may occur. The most common problems include: A mild allergic reaction, such as red, swollen areas of skin (hives) and itching. Fever or chills. This may be the body's response to new blood cells received. This may occur during or up to 4 hours after the transfusion. More serious problems may include: Transfusion-associated circulatory overload (TACO), or too much fluid in the lungs. This may cause breathing problems. A serious allergic reaction, such as difficulty breathing or swelling around the face and lips. Transfusion-related acute lung injury  (TRALI), which causes breathing difficulty and low oxygen in the blood. This can occur within hours of the transfusion or several days later. Iron overload. This can happen after receiving many blood transfusions over a period of time. Infection or virus being transmitted. This is rare because donated blood is carefully tested before it is given. Hemolytic transfusion reaction. This is rare. It happens when your body's defense system (immune system)tries to attack the new blood cells. Symptoms may include fever, chills, nausea, low blood pressure, and low back or chest pain. Transfusion-associated graft-versus-host disease (TAGVHD). This is rare. It happens when donated cells attack your body's healthy tissues. What happens before the procedure? Medicines Ask your health care provider about: Changing or stopping your regular medicines. This is especially important if you are taking diabetes medicines or blood thinners. Taking medicines such as aspirin and ibuprofen. These medicines can thin your blood. Do not take these medicines unless your health care provider tells you to take them. Taking over-the-counter medicines, vitamins, herbs, and supplements. General instructions Follow instructions from your health care provider about eating and drinking restrictions. You will have a blood test to determine your blood type. This is necessary to know what kind of blood your body will accept and to match it to the donor blood. If you are going to have a planned surgery, you may be able to do an autologous blood donation. This may be done in case you need to have a transfusion. You will have your temperature, blood pressure, and pulse monitored before the transfusion. If you have had an allergic reaction to a transfusion in the past, you may be given medicine to help prevent   a reaction. This medicine may be given to you by mouth (orally) or through an IV. Set aside time for the blood transfusion. This  procedure generally takes 1-4 hours to complete. What happens during the procedure?  An IV will be inserted into one of your veins. The bag of donated blood will be attached to your IV. The blood will then enter through your vein. Your temperature, blood pressure, and pulse will be monitored regularly during the transfusion. This monitoring is done to detect early signs of a transfusion reaction. Tell your nurse right away if you have any of these symptoms during the transfusion: Shortness of breath or trouble breathing. Chest or back pain. Fever or chills. Hives or itching. If you have any signs or symptoms of a reaction, your transfusion will be stopped and you may be given medicine. When the transfusion is complete, your IV will be removed. Pressure may be applied to the IV site for a few minutes. A bandage (dressing)will be applied. The procedure may vary among health care providers and hospitals. What happens after the procedure? Your temperature, blood pressure, pulse, breathing rate, and blood oxygen level will be monitored until you leave the hospital or clinic. Your blood may be tested to see how you are responding to the transfusion. You may be warmed with fluids or blankets to maintain a normal body temperature. If you receive your blood transfusion in an outpatient setting, you will be told whom to contact to report any reactions. Where to find more information For more information on blood transfusions, visit the [employer]: redcross.org Summary A blood transfusion is a procedure in which you receive blood or a type of blood cell (blood component) through an IV. The blood you receive may come from a donor or be donated by yourself (autologous blood donation) before a planned surgery. The blood given in a transfusion is made up of different blood components. You may receive red blood cells, platelets, plasma, or white blood cells depending on the condition treated. Your  temperature, blood pressure, and pulse will be monitored before, during, and after the transfusion. After the transfusion, your blood may be tested to see how your body has responded. This information is not intended to replace advice given to you by your health care provider. Make sure you discuss any questions you have with your health care provider. Document Revised: 04/04/2019 Document Reviewed: 11/22/2018 Elsevier Patient Education  2022 Elsevier Inc.  

## 2021-05-15 DIAGNOSIS — R768 Other specified abnormal immunological findings in serum: Secondary | ICD-10-CM | POA: Diagnosis not present

## 2021-05-15 DIAGNOSIS — E1169 Type 2 diabetes mellitus with other specified complication: Secondary | ICD-10-CM | POA: Diagnosis not present

## 2021-05-15 DIAGNOSIS — C9310 Chronic myelomonocytic leukemia not having achieved remission: Secondary | ICD-10-CM | POA: Diagnosis not present

## 2021-05-17 ENCOUNTER — Other Ambulatory Visit: Payer: Self-pay

## 2021-05-17 ENCOUNTER — Ambulatory Visit (INDEPENDENT_AMBULATORY_CARE_PROVIDER_SITE_OTHER): Payer: Medicare HMO | Admitting: Family Medicine

## 2021-05-17 ENCOUNTER — Other Ambulatory Visit: Payer: Self-pay | Admitting: Hematology

## 2021-05-17 ENCOUNTER — Inpatient Hospital Stay: Payer: Medicare HMO

## 2021-05-17 ENCOUNTER — Encounter: Payer: Self-pay | Admitting: Family Medicine

## 2021-05-17 VITALS — BP 113/70 | HR 107 | Temp 97.6°F | Ht 63.0 in | Wt 147.0 lb

## 2021-05-17 VITALS — BP 108/64 | HR 89 | Temp 97.9°F | Resp 16

## 2021-05-17 DIAGNOSIS — Z5111 Encounter for antineoplastic chemotherapy: Secondary | ICD-10-CM | POA: Diagnosis not present

## 2021-05-17 DIAGNOSIS — S22000A Wedge compression fracture of unspecified thoracic vertebra, initial encounter for closed fracture: Secondary | ICD-10-CM | POA: Diagnosis not present

## 2021-05-17 DIAGNOSIS — E538 Deficiency of other specified B group vitamins: Secondary | ICD-10-CM

## 2021-05-17 DIAGNOSIS — I10 Essential (primary) hypertension: Secondary | ICD-10-CM | POA: Diagnosis not present

## 2021-05-17 DIAGNOSIS — D693 Immune thrombocytopenic purpura: Secondary | ICD-10-CM | POA: Diagnosis not present

## 2021-05-17 DIAGNOSIS — D696 Thrombocytopenia, unspecified: Secondary | ICD-10-CM

## 2021-05-17 DIAGNOSIS — R69 Illness, unspecified: Secondary | ICD-10-CM | POA: Diagnosis not present

## 2021-05-17 DIAGNOSIS — D649 Anemia, unspecified: Secondary | ICD-10-CM | POA: Diagnosis not present

## 2021-05-17 DIAGNOSIS — C9310 Chronic myelomonocytic leukemia not having achieved remission: Secondary | ICD-10-CM

## 2021-05-17 DIAGNOSIS — Z79899 Other long term (current) drug therapy: Secondary | ICD-10-CM | POA: Diagnosis not present

## 2021-05-17 DIAGNOSIS — G4700 Insomnia, unspecified: Secondary | ICD-10-CM | POA: Diagnosis not present

## 2021-05-17 DIAGNOSIS — E119 Type 2 diabetes mellitus without complications: Secondary | ICD-10-CM | POA: Diagnosis not present

## 2021-05-17 DIAGNOSIS — Z23 Encounter for immunization: Secondary | ICD-10-CM | POA: Diagnosis not present

## 2021-05-17 DIAGNOSIS — Z452 Encounter for adjustment and management of vascular access device: Secondary | ICD-10-CM

## 2021-05-17 LAB — CMP (CANCER CENTER ONLY)
ALT: 14 U/L (ref 0–44)
AST: 15 U/L (ref 15–41)
Albumin: 3 g/dL — ABNORMAL LOW (ref 3.5–5.0)
Alkaline Phosphatase: 145 U/L — ABNORMAL HIGH (ref 38–126)
Anion gap: 7 (ref 5–15)
BUN: 16 mg/dL (ref 8–23)
CO2: 25 mmol/L (ref 22–32)
Calcium: 8.2 mg/dL — ABNORMAL LOW (ref 8.9–10.3)
Chloride: 107 mmol/L (ref 98–111)
Creatinine: 0.75 mg/dL (ref 0.44–1.00)
GFR, Estimated: >60 mL/min (ref >60)
Glucose, Bld: 175 mg/dL — ABNORMAL HIGH (ref 70–99)
Potassium: 4.1 mmol/L (ref 3.5–5.1)
Sodium: 139 mmol/L (ref 135–145)
Total Bilirubin: 0.6 mg/dL (ref 0.3–1.2)
Total Protein: 6.5 g/dL (ref 6.5–8.1)

## 2021-05-17 LAB — CBC WITH DIFFERENTIAL (CANCER CENTER ONLY)
Abs Immature Granulocytes: 0.05 10*3/uL (ref 0.00–0.07)
Basophils Absolute: 0 10*3/uL (ref 0.0–0.1)
Basophils Relative: 1 %
Eosinophils Absolute: 0 10*3/uL (ref 0.0–0.5)
Eosinophils Relative: 0 %
HCT: 26 % — ABNORMAL LOW (ref 36.0–46.0)
Hemoglobin: 8.1 g/dL — ABNORMAL LOW (ref 12.0–15.0)
Immature Granulocytes: 1 %
Lymphocytes Relative: 11 %
Lymphs Abs: 0.7 10*3/uL (ref 0.7–4.0)
MCH: 26.5 pg (ref 26.0–34.0)
MCHC: 31.2 g/dL (ref 30.0–36.0)
MCV: 85 fL (ref 80.0–100.0)
Monocytes Absolute: 1.3 10*3/uL — ABNORMAL HIGH (ref 0.1–1.0)
Monocytes Relative: 19 %
Neutro Abs: 4.6 10*3/uL (ref 1.7–7.7)
Neutrophils Relative %: 68 %
Platelet Count: <5 10*3/uL — CL (ref 150–400)
RBC: 3.06 MIL/uL — ABNORMAL LOW (ref 3.87–5.11)
RDW: 17.4 % — ABNORMAL HIGH (ref 11.5–15.5)
WBC Count: 6.7 10*3/uL (ref 4.0–10.5)
nRBC: 0 % (ref 0.0–0.2)

## 2021-05-17 LAB — PREPARE PLATELET PHERESIS: Unit division: 0

## 2021-05-17 LAB — BPAM PLATELET PHERESIS
Blood Product Expiration Date: 202212032359
ISSUE DATE / TIME: 202212021405
Unit Type and Rh: 5100

## 2021-05-17 LAB — SAMPLE TO BLOOD BANK

## 2021-05-17 MED ORDER — HEPARIN SOD (PORK) LOCK FLUSH 100 UNIT/ML IV SOLN
250.0000 [IU] | INTRAVENOUS | Status: AC | PRN
  Administered 2021-05-17: 250 [IU]

## 2021-05-17 MED ORDER — SODIUM CHLORIDE 0.9% FLUSH: 10.0000 mL | Freq: Once | INTRAVENOUS | Status: DC

## 2021-05-17 MED ORDER — SODIUM CHLORIDE 0.9% IV SOLUTION
250.0000 mL | Freq: Once | INTRAVENOUS | Status: AC
  Administered 2021-05-17: 250 mL via INTRAVENOUS

## 2021-05-17 MED ORDER — OXYCODONE HCL 10 MG PO TABS: ORAL_TABLET | ORAL | 0 refills | Status: DC

## 2021-05-17 MED ORDER — LORAZEPAM 1 MG PO TABS: 1.0000 mg | ORAL_TABLET | Freq: Three times a day (TID) | ORAL | 0 refills | Status: DC | PRN

## 2021-05-17 MED ORDER — SODIUM CHLORIDE 0.9% FLUSH
10.0000 mL | Freq: Once | INTRAVENOUS | Status: AC
  Administered 2021-05-17: 10 mL via INTRAVENOUS

## 2021-05-17 MED ORDER — SODIUM CHLORIDE 0.9% FLUSH
10.0000 mL | INTRAVENOUS | Status: AC | PRN
  Administered 2021-05-17: 10 mL

## 2021-05-17 MED ORDER — CYANOCOBALAMIN 1000 MCG/ML IJ SOLN: 1000.0000 ug | Freq: Once | INTRAMUSCULAR | Status: DC

## 2021-05-17 MED ORDER — ROMIPLOSTIM INJECTION 500 MCG
660.0000 ug | Freq: Once | SUBCUTANEOUS | Status: AC
  Administered 2021-05-17: 660 ug via SUBCUTANEOUS
  Filled 2021-05-17: qty 1.07

## 2021-05-17 MED ORDER — OXYCODONE HCL 10 MG PO TABS: 10.0000 mg | ORAL_TABLET | Freq: Four times a day (QID) | ORAL | 0 refills | Status: DC

## 2021-05-17 NOTE — Progress Notes (Signed)
Orders for 1 unit of plts entered and prepare order released.  Pt will return on 05/20/2021 for HLA plts.  [facility] will prepare randoms today and will order HLA plts for Thursday 05/20/2021.

## 2021-05-17 NOTE — Progress Notes (Signed)
Kathleen Gibson , 10-Jan-1951, 70 y.o., female MRN: [id number] Patient Care Team    Relationship Specialty Notifications Start End  [name], DO PCP - General Family Medicine  11/08/17   Doran Stabler, MD Consulting Physician Gastroenterology  04/06/17   Truitt Merle, MD Consulting Physician Hematology  04/06/17   Alda Berthold, [place] Physician Neurology  01/16/19     Chief Complaint  Patient presents with   Pain Management     Subjective:  Kathleen Gibson  is a 70 y.o. female presents for severe back pain.She has a known  T7-T8 compression fracture.  Unfortunately her surgery was postponed 2/2 to her plts dropping again w/ her [name]. Her pain has been managed with oxy 10 q 4-6 hrs since her hospital admission.  Recent Labs  Lab 05/12/21 1255 05/14/21 1327 05/17/21 0738  HGB 9.1* 8.8* 8.1*  HCT 28.5* 28.6* 26.0*  WBC 10.8* 10.5 6.7  PLT 8* 5* <5*   CMP Latest Ref Rng & Units 05/17/2021 05/14/2021 05/12/2021  Glucose 70 - 99 mg/dL 175(H) 180(H) 216(H)  BUN 8 - 23 mg/dL $Remove'16 16 16  'bhdmPOA$ Creatinine 0.44 - 1.00 mg/dL 0.75 0.77 0.72  Sodium 135 - 145 mmol/L 139 138 137  Potassium 3.5 - 5.1 mmol/L 4.1 4.3 3.8  Chloride 98 - 111 mmol/L 107 107 106  CO2 22 - 32 mmol/L $RemoveB'25 24 26  'JAqVFics$ Calcium 8.9 - 10.3 mg/dL 8.2(L) 8.0(L) 8.5(L)  Total Protein 6.5 - 8.1 g/dL 6.5 6.8 6.9  Total Bilirubin 0.3 - 1.2 mg/dL 0.6 0.7 0.8  Alkaline Phos 38 - 126 U/L 145(H) 148(H) 138(H)  AST 15 - 41 U/L 15 14(L) 16  ALT 0 - 44 U/L $Remo'14 14 15   'Inuhp$ No results found.  Depression screen [facility] 2/9 03/26/2021 12/22/2020 10/28/2020 05/22/2020  Decreased Interest 0 2 1 0  Down, Depressed, Hopeless 0 1 1 0  PHQ - 2 Score 0 3 2 0  Altered sleeping - 3 0 -  Tired, decreased energy - 1 3 -  Change in appetite - 0 2 -  Feeling bad or failure about yourself  - 0 1 -  Trouble concentrating - 1 2 -  Moving slowly or fidgety/restless - 0 1 -  Suicidal thoughts - 0 0 -  PHQ-9 Score - 8 11 -  Some recent data might be  hidden    Allergies  Allergen Reactions   Asa [Aspirin] Other (See Comments)    Contraindicated d/t low plts   Nsaids Other (See Comments)    Contraindicated d/t low plts   Social History   Tobacco Use   Smoking status: Never   Smokeless tobacco: Never  Substance Use Topics   Alcohol use: No   Past Medical History:  Diagnosis Date   Diabetes ([place])    HLD (hyperlipidemia)    Hypertension    Pernicious anemia    Pneumonia 09/05/2019   Renal insufficiency 09/06/2019   Vitamin D deficiency    Past Surgical History:  Procedure Laterality Date   ABDOMINAL HERNIA REPAIR  2009   ABDOMINOPLASTY     AXILLARY LYMPH NODE BIOPSY Left 11/11/2020   Procedure: LEFT AXILLARY EXCISIONAL LYMPH NODE BIOPSY;  Surgeon: [name], MD;  Location: [facility];  Service: General;  Laterality: Left;   [place]   hemorroid surgery  2007   [place]   Family History  Problem Relation Age of Onset   Cancer Mother 74       unknown type cancer    Hypertension Mother    Hypertension Father    Heart failure Father    Pneumonia Father    Diabetes Sister    Leukemia Brother    Colon cancer Brother 70   Heart Problems Brother    Leukemia Other    Diabetes Sister    Heart Problems Sister    Cancer Brother    Heart Problems Brother    Allergies as of 05/17/2021       Reactions   Asa [aspirin] Other (See Comments)   Contraindicated d/t low plts   Nsaids Other (See Comments)   Contraindicated d/t low plts        Medication List        Accurate as of May 17, 2021  3:49 PM. If you have any questions, ask your nurse or doctor.          STOP taking these medications    Acetaminophen-Codeine 300-30 MG tablet Stopped by: Howard Pouch, DO   cyclobenzaprine 10 MG tablet Commonly known as: FLEXERIL Stopped by: Howard Pouch, DO   zolpidem 5 MG tablet Commonly known as: AMBIEN Stopped by: Howard Pouch, DO        TAKE these medications    acetaminophen 500 MG tablet Commonly known as: TYLENOL Take 2 tablets (1,000 mg total) by mouth every 8 (eight) hours.   bisacodyl 10 MG suppository Commonly known as: DULCOLAX Place 1 suppository (10 mg total) rectally daily as needed for moderate constipation (Please take-if no bowel movement in 1-2 days with MiraLAX/senna).   gabapentin 300 MG capsule Commonly known as: NEURONTIN Take 2 capsules (600 mg total) by mouth 2 (two) times daily.   LORazepam 1 MG tablet Commonly known as: ATIVAN Take 1 tablet (1 mg total) by mouth every 8 (eight) hours as needed for anxiety. What changed: See the new instructions. Changed by: Truitt Merle, MD   metoprolol succinate 25 MG 24 hr tablet Commonly known as: TOPROL-XL Take 1 tablet (25 mg total) by mouth daily.   Narcan 4 MG/0.1ML Liqd nasal spray kit Generic drug: naloxone Place 1 spray into the nose once as needed (overdose).   Oxycodone HCl 10 MG Tabs 1 tab every 4-6 hr prn for chronic  pain What changed:  how much to take how to take this when to take this additional instructions Changed by: Howard Pouch, DO   Oxycodone HCl 10 MG Tabs 1 tab q 4-6 hrs for chronic pain What changed: You were already taking a medication with the same name, and this prescription was added. Make sure you understand how and when to take each. Changed by: Howard Pouch, DO   Oxycodone HCl 10 MG Tabs 1 tab every 4-6 hr prn for chronic pain What changed: You were already taking a medication with the same name, and this prescription was added. Make sure you understand how and when to take each. Changed by: Howard Pouch, DO   polyethylene glycol powder 17 GM/SCOOP powder Commonly known as: GLYCOLAX/MIRALAX Dissolve 1 capful (17 g) in water and drink 2 (two) times daily.   senna-docusate 8.6-50 MG tablet Commonly known as: Senokot-S Take 2 tablets by mouth at bedtime.   sitaGLIPtin-metformin 50-1000 MG tablet Commonly  known as: JANUMET Take 1 tablet by mouth at bedtime.        All past medical history, surgical history, allergies, family history, immunizations and medications  were updated in the EMR today and reviewed under the history and medication portions of their EMR.      ROS: Negative, with the exception of above mentioned in HPI   Objective:  BP 113/70   Pulse (!) 107   Temp 97.6 F (36.4 C) (Oral)   Ht $R'5\' 3"'OD$  (1.6 m)   Wt 147 lb (66.7 kg)   SpO2 98%   BMI 26.04 kg/m  Body mass index is 26.04 kg/m. Gen: Afebrile. No acute distress.  HENT: AT. [place].  Eyes:Pupils Equal Round Reactive to light, Extraocular movements intact,  Conjunctiva without redness, discharge or icterus. Neuro: Normal gait. PERLA. EOMi. Alert. Oriented x3 Psych: Normal affect, dress and demeanor. Normal speech. Normal thought content and judgment.   Assessment/Plan: Kathleen Gibson is a 70 y.o. female present for OV for pain management ANEMIA, PERNICIOUS Continue B12 injections every 4 weeks indefinitely B12 injection Completed today for her.   INSOMNIA, CHRONIC Ambien being  placed on hold for now during use of Flexeril and chronic opioids. She has been using ativan $RemoveBefo'1mg'YxrQSlcfGxo$  qhs prescribed by onc. Strongly warned her of OD risk with benzo and opiate classes together.  She will try to decrease her ativan dose and will separate doses of the two meds. She will hopefully not require opiates past couple months once her compression fx heals or she is able to have surgery/treatment.    Thrombocytopenia (HCC)/Chronic myelomonocytic leukemia not having achieved remission [facility]) Has routine follow-ups with her oncology team.  Intractable back pain/Compression fracture of body of thoracic vertebra ([place]) Pain is still present.  She has been using ativan $RemoveBefo'1mg'HhtSGTorvxl$  qhs prescribed by onc. Strongly warned her of OD risk with benzo and opiate classes together.  She will try to decrease her ativan dose and will separate doses of the two  meds. She will hopefully not require opiates past couple months once her compression fx heals or she is able to have surgery/treatment.  Refilled oxycodone 10 mg tab #120 with 2 refills if needed only. She will start tapering back on oxy as pain allows.  Patient reports understanding. Patient was encouraged to start Senokot 1-2 tabs nightly to avoid constipation with opiate use.  Reviewed expectations re: course of current medical issues. Discussed self-management of symptoms. Outlined signs and symptoms indicating need for more acute intervention. Patient verbalized understanding and all questions were answered. Patient received an After-Visit Summary. Any changes in medications were reviewed and patient was provided with updated med list with their AVS.     No orders of the defined types were placed in this encounter. Meds ordered this encounter  Medications   cyanocobalamin ((VITAMIN B-12)) injection 1,000 mcg   Oxycodone HCl 10 MG TABS    Sig: 1 tab every 4-6 hr prn for chronic  pain    Dispense:  120 tablet    Refill:  0   Oxycodone HCl 10 MG TABS    Sig: 1 tab q 4-6 hrs for chronic pain    Dispense:  120 tablet    Refill:  0    May refill ~30 days after prescribed date   Oxycodone HCl 10 MG TABS    Sig: 1 tab every 4-6 hr prn for chronic pain    Dispense:  120 tablet    Refill:  0    May refill ~60 days after prescribed date    Note is dictated utilizing voice recognition software. Although note has been proof read prior to signing, occasional typographical errors  still can be missed. If any questions arise, please do not hesitate to call for verification.   electronically signed by:  Howard Pouch, DO  [place]

## 2021-05-17 NOTE — Patient Instructions (Addendum)
    I have filled your medication. You will have 3 separate scripts at your pharmacy.   If needing additional refills we will need to see you in person again.

## 2021-05-17 NOTE — Patient Instructions (Addendum)
Platelet Transfusion A platelet transfusion is a procedure in which a person receives donated platelets through an IV. Platelets are parts of blood that stick together and form a clot to help the body stop bleeding after an injury. If you have too few platelets, your blood may have trouble clotting. This may cause you to bleed and bruise very easily. You may need a platelet transfusion if you have a condition that causes a low number of platelets (thrombocytopenia). A platelet transfusion may be used to stop or prevent excessive bleeding. Tell a health care provider about: Any reactions you have had during previous transfusions. Any allergies you have. All medicines you are taking, including vitamins, herbs, eye drops, creams, and over-the-counter medicines. Any bleeding problems you have. Any surgeries you have had. Any medical conditions you have. Whether you are pregnant or may be pregnant. What are the risks? Generally, this is a safe procedure. However, problems may occur, including: Fever. Infection. Allergic reaction to the donated (donor) platelets. Your body's disease-fighting system (immune system) attacking the donor platelets (hemolytic reaction). This is rare. A rare reaction that causes lung damage (transfusion-related acute lung injury). What happens before the procedure? Medicines Ask your health care provider about: Changing or stopping your regular medicines. This is especially important if you are taking diabetes medicines or blood thinners. Taking medicines such as aspirin and ibuprofen. These medicines can thin your blood. Do not take these medicines unless your health care provider tells you to take them. Taking over-the-counter medicines, vitamins, herbs, and supplements. General instructions You will have a blood test to determine your blood type. Your blood type determines what kind of platelets you will be given. Follow instructions from your health care provider  about eating or drinking restrictions. If you have had an allergic reaction to a transfusion in the past, you may be given medicine to help prevent a reaction. Your temperature, blood pressure, pulse, and breathing will be monitored. What happens during the procedure?  An IV will be inserted into one of your veins. For your safety, two health care providers will verify your identity along with the donor platelets about to be infused. A bag of donor platelets will be connected to your IV. The platelets will flow into your bloodstream. This usually takes 30-60 minutes. Your temperature, blood pressure, pulse, and breathing will be monitored during the transfusion. This helps detect early signs of any reaction. You will also be monitored for other symptoms that may indicate a reaction, including chills, hives, or itching. If you have signs of a reaction at any time, your transfusion will be stopped, and you may be given medicine to help manage the reaction. When your transfusion is complete, your IV will be removed. Pressure may be applied to the IV site for a few minutes to stop any bleeding. The IV site will be covered with a bandage (dressing). The procedure may vary among health care providers and hospitals. What can I expect after the procedure? Your blood pressure, temperature, pulse, and breathing will be monitored until you leave the hospital or clinic. You may have some bruising and soreness at your IV site. Follow these instructions at home: Medicines Take over-the-counter and prescription medicines only as told by your health care provider. Talk with your health care provider before you take any medicines that contain aspirin or NSAIDs, such as ibuprofen. These medicines increase your risk for dangerous bleeding. IV site care Check your IV site every day for signs of infection. Check for:   Redness, swelling, or pain. Fluid or blood. If fluid or blood drains from your IV site, use your  hands to press down firmly on a bandage covering the area for a minute or two. Doing this should stop the bleeding. Warmth. Pus or a bad smell. General instructions Change or remove your dressing as told by your health care provider. Return to your normal activities as told by your health care provider. Ask your health care provider what activities are safe for you. Do not take baths, swim, or use a hot tub until your health care provider approves. Ask your health care provider if you may take showers. Keep all follow-up visits. This is important. Contact a health care provider if: You have a headache that does not go away with medicine. You have hives, rash, or itchy skin. You have nausea or vomiting. You feel unusually tired or weak. You have signs of infection at your IV site. Get help right away if: You have a fever or chills. You urinate less often than usual. Your urine is darker colored than normal. You have any of the following: Trouble breathing. Pain in your back, abdomen, or chest. Cool, clammy skin. A fast heartbeat. Summary Platelets are tiny pieces of blood cells that clump together to form a blood clot when you have an injury. If you have too few platelets, your blood may have trouble clotting. A platelet transfusion is a procedure in which you receive donated platelets through an IV. A platelet transfusion may be used to stop or prevent excessive bleeding. After the procedure, check your IV site every day for signs of infection. This information is not intended to replace advice given to you by your health care provider. Make sure you discuss any questions you have with your health care provider.  Thrombocytopenia Thrombocytopenia means that you have a low number of platelets in your blood. Platelets are tiny cells in the blood. When you bleed, they clump together at the cut or injury to stop the bleeding. This is called blood clotting. If you do not have enough  platelets, your blood may have trouble clotting. This may cause you to bleed and bruise very easily. What are the causes? This condition is caused by a low number of platelets in your blood. There are three main reasons for this: Your body not making enough platelets. This may be caused by: Bone marrow diseases. Disorders that are passed from parent to child (inherited). Certain cancer medicines or treatments. Infection from germs (bacteria or viruses). Alcoholism. Platelets not being released in the blood. This can be caused by: Having a spleen that is larger than normal. A condition called Gaucher disease. Your body destroying platelets too quickly. This may be caused by: Certain autoimmune diseases. Some medicines that thin your blood. Certain blood clotting disorders. Certain bleeding disorders. Exposure to harmful (toxic) chemicals. Pregnancy. What are the signs or symptoms? Bruising easily. Bleeding from the nose or mouth. Heavy menstrual periods. Blood in the pee (urine), poop (stool), or vomit. A purple-red color to the skin (purpura). A rash that looks like pinpoint, purple-red spots (petechiae) on the lower legs. How is this treated? Treatment depends on the cause. Treatment may include: Treatment of another condition that is causing the low platelet count. Medicines to help protect your platelets from being destroyed. A replacement (transfusion) of platelets to stop or prevent bleeding. Surgery to take out the spleen. Follow these instructions at home: Medicines Take over-the-counter and prescription medicines only as told by  your doctor. Do not take any medicines that have aspirin or NSAIDs, such as ibuprofen. Activity Avoid doing things that could hurt or bruise you. Take action to prevent falls. Do not play contact sports. Ask your doctor what activities are safe for you. Take care not to burn yourself: When you use an iron. When you cook. Take care not to cut  yourself: When you shave. When you use scissors, needles, knives, or other tools. General instructions  Check your skin and the inside of your mouth for bruises or blood as told by your doctor. Wear a medical alert bracelet that says that you have a bleeding disorder. Check to see if there is blood in your pee and poop. Do this as told by your doctor. Do not drink alcohol. If you do drink, limit the amount that you drink. Stay away from harmful (toxic) chemicals. Tell all of your doctors that you have this condition. Be sure to tell your dentist and eye doctor. Tell your dentist about your condition before you have your teeth cleaned. Keep all follow-up visits. Contact a doctor if: You have bruises and you do not know why. You have new symptoms. You have symptoms that get worse. You have a fever. Get help right away if: You have very bad bleeding anywhere on your body. You have blood in your vomit, pee, or poop. You have an injury to your head. You have a sudden, very bad headache. Summary Thrombocytopenia means that you have a low number of platelets in your blood. Platelets stick together to form a clot. Symptoms of this condition include getting bruises easily, bleeding from the mouth and nose, a purple-red color to the skin, and a rash. Take care not to cut or burn yourself. This information is not intended to replace advice given to you by your health care provider. Make sure you discuss any questions you have with your health care provider. Document Revised: 11/12/2020 Document Reviewed: 11/12/2020 Elsevier Patient Education  [place]. Romiplostim injection What is this medication? ROMIPLOSTIM (roe mi PLOE stim) helps your body make more platelets. This medicine is used to treat low platelets caused by chronic idiopathic thrombocytopenic purpura (ITP) or a bone marrow syndrome caused by radiation sickness. This medicine may be used for other purposes; ask your health  care provider or pharmacist if you have questions. COMMON BRAND NAME(S): Nplate What should I tell my care team before I take this medication? They need to know if you have any of these conditions: blood clots myelodysplastic syndrome an unusual or allergic reaction to romiplostim, mannitol, other medicines, foods, dyes, or preservatives pregnant or trying to get pregnant breast-feeding How should I use this medication? This medicine is injected under the skin. It is given by a health care provider in a hospital or clinic setting. A special MedGuide will be given to you before each treatment. Be sure to read this information carefully each time. Talk to your health care provider about the use of this medicine in children. While it may be prescribed for children as young as newborns for selected conditions, precautions do apply. Overdosage: If you think you have taken too much of this medicine contact a poison control center or emergency room at once. NOTE: This medicine is only for you. Do not share this medicine with others. What if I miss a dose? Keep appointments for follow-up doses. It is important not to miss your dose. Call your health care provider if you are unable to keep  an appointment. What may interact with this medication? Interactions are not expected. This list may not describe all possible interactions. Give your health care provider a list of all the medicines, herbs, non-prescription drugs, or dietary supplements you use. Also tell them if you smoke, drink alcohol, or use illegal drugs. Some items may interact with your medicine. What should I watch for while using this medication? Visit your health care provider for regular checks on your progress. You may need blood work done while you are taking this medicine. Your condition will be monitored carefully while you are receiving this medicine. It is important not to miss any appointments. What side effects may I notice from  receiving this medication? Side effects that you should report to your doctor or health care professional as soon as possible: allergic reactions (skin rash, itching or hives; swelling of the face, lips, or tongue) bleeding (bloody or black, tarry stools; red or dark brown urine; spitting up blood or brown material that looks like coffee grounds; red spots on the skin; unusual bruising or bleeding from the eyes, gums, or nose) blood clot (chest pain; shortness of breath; pain, swelling, or warmth in the leg) stroke (changes in vision; confusion; trouble speaking or understanding; severe headaches; sudden numbness or weakness of the face, arm or leg; trouble walking; dizziness; loss of balance or coordination) Side effects that usually do not require medical attention (report to your doctor or health care professional if they continue or are bothersome): diarrhea dizziness headache joint pain muscle pain stomach pain trouble sleeping This list may not describe all possible side effects. Call your doctor for medical advice about side effects. You may report side effects to FDA at 1-800-FDA-1088. Where should I keep my medication? This medicine is given in a hospital or clinic. It will not be stored at home. NOTE: This sheet is a summary. It may not cover all possible information. If you have questions about this medicine, talk to your doctor, pharmacist, or health care provider.  2022 Elsevier/Gold Standard (2021-02-16 00:00:00)   Document Revised: 12/03/2020 Document Reviewed: 12/03/2020 Elsevier Patient Education  [place].

## 2021-05-18 LAB — BPAM PLATELET PHERESIS
Blood Product Expiration Date: 202212072359
ISSUE DATE / TIME: 202212050846
Unit Type and Rh: 5100

## 2021-05-18 LAB — PREPARE PLATELET PHERESIS: Unit division: 0

## 2021-05-20 ENCOUNTER — Other Ambulatory Visit: Payer: Medicaid Other

## 2021-05-20 DIAGNOSIS — K5900 Constipation, unspecified: Secondary | ICD-10-CM | POA: Diagnosis not present

## 2021-05-20 DIAGNOSIS — D696 Thrombocytopenia, unspecified: Secondary | ICD-10-CM | POA: Diagnosis not present

## 2021-05-20 DIAGNOSIS — Z79899 Other long term (current) drug therapy: Secondary | ICD-10-CM | POA: Diagnosis not present

## 2021-05-20 DIAGNOSIS — M5489 Other dorsalgia: Secondary | ICD-10-CM | POA: Diagnosis not present

## 2021-05-20 DIAGNOSIS — R69 Illness, unspecified: Secondary | ICD-10-CM | POA: Diagnosis not present

## 2021-05-20 DIAGNOSIS — E119 Type 2 diabetes mellitus without complications: Secondary | ICD-10-CM | POA: Diagnosis not present

## 2021-05-20 DIAGNOSIS — I10 Essential (primary) hypertension: Secondary | ICD-10-CM | POA: Diagnosis not present

## 2021-05-20 DIAGNOSIS — Z9884 Bariatric surgery status: Secondary | ICD-10-CM | POA: Diagnosis not present

## 2021-05-20 DIAGNOSIS — C9310 Chronic myelomonocytic leukemia not having achieved remission: Secondary | ICD-10-CM | POA: Diagnosis not present

## 2021-05-20 DIAGNOSIS — R112 Nausea with vomiting, unspecified: Secondary | ICD-10-CM | POA: Diagnosis not present

## 2021-05-20 DIAGNOSIS — M5459 Other low back pain: Secondary | ICD-10-CM | POA: Diagnosis not present

## 2021-05-21 DIAGNOSIS — R112 Nausea with vomiting, unspecified: Secondary | ICD-10-CM | POA: Diagnosis not present

## 2021-05-21 MED FILL — Dexamethasone Sodium Phosphate Inj 100 MG/10ML: INTRAMUSCULAR | Qty: 1 | Status: AC

## 2021-05-24 ENCOUNTER — Encounter: Payer: Self-pay | Admitting: Hematology

## 2021-05-24 ENCOUNTER — Inpatient Hospital Stay: Payer: Medicare HMO

## 2021-05-24 ENCOUNTER — Inpatient Hospital Stay (HOSPITAL_BASED_OUTPATIENT_CLINIC_OR_DEPARTMENT_OTHER): Payer: Medicare HMO | Admitting: Hematology

## 2021-05-24 ENCOUNTER — Other Ambulatory Visit: Payer: Self-pay

## 2021-05-24 VITALS — BP 127/70 | HR 88 | Temp 98.0°F | Resp 17 | Ht 63.0 in | Wt 147.3 lb

## 2021-05-24 DIAGNOSIS — D696 Thrombocytopenia, unspecified: Secondary | ICD-10-CM

## 2021-05-24 DIAGNOSIS — D649 Anemia, unspecified: Secondary | ICD-10-CM | POA: Diagnosis not present

## 2021-05-24 DIAGNOSIS — Z452 Encounter for adjustment and management of vascular access device: Secondary | ICD-10-CM

## 2021-05-24 DIAGNOSIS — C9310 Chronic myelomonocytic leukemia not having achieved remission: Secondary | ICD-10-CM

## 2021-05-24 DIAGNOSIS — I10 Essential (primary) hypertension: Secondary | ICD-10-CM | POA: Diagnosis not present

## 2021-05-24 DIAGNOSIS — E1142 Type 2 diabetes mellitus with diabetic polyneuropathy: Secondary | ICD-10-CM | POA: Diagnosis not present

## 2021-05-24 DIAGNOSIS — Z5111 Encounter for antineoplastic chemotherapy: Secondary | ICD-10-CM | POA: Diagnosis not present

## 2021-05-24 DIAGNOSIS — D693 Immune thrombocytopenic purpura: Secondary | ICD-10-CM

## 2021-05-24 DIAGNOSIS — E119 Type 2 diabetes mellitus without complications: Secondary | ICD-10-CM | POA: Diagnosis not present

## 2021-05-24 DIAGNOSIS — E538 Deficiency of other specified B group vitamins: Secondary | ICD-10-CM | POA: Diagnosis not present

## 2021-05-24 DIAGNOSIS — Z23 Encounter for immunization: Secondary | ICD-10-CM

## 2021-05-24 DIAGNOSIS — R69 Illness, unspecified: Secondary | ICD-10-CM | POA: Diagnosis not present

## 2021-05-24 DIAGNOSIS — Z79899 Other long term (current) drug therapy: Secondary | ICD-10-CM | POA: Diagnosis not present

## 2021-05-24 LAB — CMP (CANCER CENTER ONLY)
ALT: 17 U/L (ref 0–44)
AST: 17 U/L (ref 15–41)
Albumin: 3.1 g/dL — ABNORMAL LOW (ref 3.5–5.0)
Alkaline Phosphatase: 159 U/L — ABNORMAL HIGH (ref 38–126)
Anion gap: 7 (ref 5–15)
BUN: 17 mg/dL (ref 8–23)
CO2: 24 mmol/L (ref 22–32)
Calcium: 8 mg/dL — ABNORMAL LOW (ref 8.9–10.3)
Chloride: 109 mmol/L (ref 98–111)
Creatinine: 0.72 mg/dL (ref 0.44–1.00)
GFR, Estimated: >60 mL/min (ref >60)
Glucose, Bld: 125 mg/dL — ABNORMAL HIGH (ref 70–99)
Potassium: 4.1 mmol/L (ref 3.5–5.1)
Sodium: 140 mmol/L (ref 135–145)
Total Bilirubin: 0.7 mg/dL (ref 0.3–1.2)
Total Protein: 6.5 g/dL (ref 6.5–8.1)

## 2021-05-24 LAB — CBC WITH DIFFERENTIAL (CANCER CENTER ONLY)
Abs Immature Granulocytes: 0.06 10*3/uL (ref 0.00–0.07)
Basophils Absolute: 0 10*3/uL (ref 0.0–0.1)
Basophils Relative: 1 %
Eosinophils Absolute: 0 10*3/uL (ref 0.0–0.5)
Eosinophils Relative: 1 %
HCT: 25.6 % — ABNORMAL LOW (ref 36.0–46.0)
Hemoglobin: 7.9 g/dL — ABNORMAL LOW (ref 12.0–15.0)
Immature Granulocytes: 1 %
Lymphocytes Relative: 14 %
Lymphs Abs: 0.8 10*3/uL (ref 0.7–4.0)
MCH: 25.9 pg — ABNORMAL LOW (ref 26.0–34.0)
MCHC: 30.9 g/dL (ref 30.0–36.0)
MCV: 83.9 fL (ref 80.0–100.0)
Monocytes Absolute: 0.9 10*3/uL (ref 0.1–1.0)
Monocytes Relative: 17 %
Neutro Abs: 3.6 10*3/uL (ref 1.7–7.7)
Neutrophils Relative %: 66 %
Platelet Count: 24 10*3/uL — ABNORMAL LOW (ref 150–400)
RBC: 3.05 MIL/uL — ABNORMAL LOW (ref 3.87–5.11)
RDW: 17.4 % — ABNORMAL HIGH (ref 11.5–15.5)
WBC Count: 5.3 10*3/uL (ref 4.0–10.5)
nRBC: 0 % (ref 0.0–0.2)

## 2021-05-24 LAB — SAMPLE TO BLOOD BANK

## 2021-05-24 MED ORDER — INFLUENZA VAC A&B SA ADJ QUAD 0.5 ML IM PRSY
0.5000 mL | PREFILLED_SYRINGE | Freq: Once | INTRAMUSCULAR | Status: AC
  Administered 2021-05-24: 0.5 mL via INTRAMUSCULAR
  Filled 2021-05-24: qty 0.5

## 2021-05-24 MED ORDER — SODIUM CHLORIDE 0.9% FLUSH
10.0000 mL | INTRAVENOUS | Status: AC | PRN
  Administered 2021-05-24: 10 mL

## 2021-05-24 MED ORDER — SODIUM CHLORIDE 0.9 % IV SOLN
75.0000 mg/m2 | Freq: Once | INTRAVENOUS | Status: AC
  Administered 2021-05-24: 135 mg via INTRAVENOUS
  Filled 2021-05-24: qty 13.5

## 2021-05-24 MED ORDER — PALONOSETRON HCL INJECTION 0.25 MG/5ML
0.2500 mg | Freq: Once | INTRAVENOUS | Status: AC
  Administered 2021-05-24: 0.25 mg via INTRAVENOUS
  Filled 2021-05-24: qty 5

## 2021-05-24 MED ORDER — SODIUM CHLORIDE 0.9 % IV SOLN: Freq: Once | INTRAVENOUS | Status: AC

## 2021-05-24 MED ORDER — ROMIPLOSTIM INJECTION 500 MCG
10.0000 ug/kg | Freq: Once | SUBCUTANEOUS | Status: AC
  Administered 2021-05-24: 670 ug via SUBCUTANEOUS
  Filled 2021-05-24: qty 1

## 2021-05-24 MED ORDER — HEPARIN SOD (PORK) LOCK FLUSH 100 UNIT/ML IV SOLN
250.0000 [IU] | Freq: Once | INTRAVENOUS | Status: AC | PRN
  Administered 2021-05-24: 250 [IU]

## 2021-05-24 MED ORDER — SODIUM CHLORIDE 0.9% FLUSH
10.0000 mL | INTRAVENOUS | Status: DC | PRN
  Administered 2021-05-24: 10 mL

## 2021-05-24 MED ORDER — SODIUM CHLORIDE 0.9 % IV SOLN
10.0000 mg | Freq: Once | INTRAVENOUS | Status: AC
  Administered 2021-05-24: 10 mg via INTRAVENOUS
  Filled 2021-05-24: qty 10

## 2021-05-24 MED FILL — Dexamethasone Sodium Phosphate Inj 100 MG/10ML: INTRAMUSCULAR | Qty: 1 | Status: AC

## 2021-05-24 NOTE — Patient Instructions (Signed)
[facility]   Discharge Instructions: Thank you for choosing [facility] to provide your oncology and hematology care.   If you have a lab appointment with the Cancer Center, please go directly to the Cancer Center and check in at the registration area.   Wear comfortable clothing and clothing appropriate for easy access to any Portacath or PICC line.   We strive to give you quality time with your provider. You may need to reschedule your appointment if you arrive late (15 or more minutes).  Arriving late affects you and other patients whose appointments are after yours.  Also, if you miss three or more appointments without notifying the office, you may be dismissed from the clinic at the provider's discretion.      For prescription refill requests, have your pharmacy contact our office and allow 72 hours for refills to be completed.    Today you received the following chemotherapy and/or immunotherapy agents: azacitadine      To help prevent nausea and vomiting after your treatment, we encourage you to take your nausea medication as directed.  BELOW ARE SYMPTOMS THAT SHOULD BE REPORTED IMMEDIATELY: *FEVER GREATER THAN 100.4 F (38 C) OR HIGHER *CHILLS OR SWEATING *NAUSEA AND VOMITING THAT IS NOT CONTROLLED WITH YOUR NAUSEA MEDICATION *UNUSUAL SHORTNESS OF BREATH *UNUSUAL BRUISING OR BLEEDING *URINARY PROBLEMS (pain or burning when urinating, or frequent urination) *BOWEL PROBLEMS (unusual diarrhea, constipation, pain near the anus) TENDERNESS IN MOUTH AND THROAT WITH OR WITHOUT PRESENCE OF ULCERS (sore throat, sores in mouth, or a toothache) UNUSUAL RASH, SWELLING OR PAIN  UNUSUAL VAGINAL DISCHARGE OR ITCHING   Items with * indicate a potential emergency and should be followed up as soon as possible or go to the Emergency Department if any problems should occur.  Please show the CHEMOTHERAPY ALERT CARD or IMMUNOTHERAPY ALERT CARD at check-in  to the Emergency Department and triage nurse.  Should you have questions after your visit or need to cancel or reschedule your appointment, please contact [facility]  Dept: [phone]  and follow the prompts.  Office hours are 8:00 a.m. to 4:30 p.m. Monday - Friday. Please note that voicemails left after 4:00 p.m. may not be returned until the following business day.  We are closed weekends and major holidays. You have access to a nurse at all times for urgent questions. Please call the main number to the clinic Dept: [phone] and follow the prompts.   For any non-urgent questions, you may also contact your provider using MyChart. We now offer e-Visits for anyone 18 and older to request care online for non-urgent symptoms. For details visit [email or web address].   Also download the MyChart app! Go to the app store, search "MyChart", open the app, select [facility], and log in with your MyChart username and password.  Due to Covid, a mask is required upon entering the hospital/clinic. If you do not have a mask, one will be given to you upon arrival. For doctor visits, patients may have 1 support person aged 18 or older with them. For treatment visits, patients cannot have anyone with them due to current Covid guidelines and our immunocompromised population.   

## 2021-05-24 NOTE — Progress Notes (Signed)
[place]   [phone] [phone]) [phone]   Clinic Follow up Note   Patient Care Team: [name], DO as PCP - General (Family Medicine) Loletha Carrow Kirke Corin, MD as Consulting Physician (Gastroenterology) Truitt Merle, MD as Consulting Physician (Hematology) Alda Berthold, DO as Consulting Physician (Neurology)  Date of Service:  05/24/2021  CHIEF COMPLAINT: f/u of [name], severe thrombocytopenia  CURRENT THERAPY:  Azacitidine days 1-5 q28 days, started 12/28/20 Nplate 75mcg/kg weekly  Platelet transfusion as needed (plt<15K)  ASSESSMENT & PLAN:  Kathleen Gibson is a 70 y.o. female with   1. [name] with severe thrombocytopenia  -Initially diagnosed with ITP in 2014. She lost follow up after 11/2017 and did not proceed with recommended bone marrow biopsy. -She was referred to ED 10/28/20 by her PCP for low plt count of 4k. She was hospitalized and treated with platelet transfusions, IVIG, oral prednisone $RemoveBeforeD'60mg'[id number]$  and Nplate injections. She tapered off prednisone given little response. -Bone marrow biopsy 10/30/20 felt to likely represent MDS/MPN, particularly [name].  Confirmed on slide review at [facility] by Dr. Linus Orn -She began treatment for severe refractory thrombocytopenia with weekly Rituxan x4 on 11/19/20, she did not respond  -She began azacitidine daily days 1-5 q. 28 days on 12/28/20 -She had worsening thrombocytopenia after stopping Nplate, restarted weekly Nplate 10 mcg/kg on 07/03/9756 -Bone marrow biopsy 03/18/2021 showed stable disease, no increase in blasts. -her platelet count improved in 03/2021 and has decreased again since then. It has increased some today to 24k; her most recent blood transfusion was 05/17/21. We will proceed with treatment today.   2.  T7/T8 compression fracture -Pending kyphoplasty with Dr. Hulan Saas of neurosurgery at [place] next week as long as platelets greater than 50K -In a thoracic brace  today, she has persistent pain but adequately managed on oxycodone 4 times daily -will check with IR to see if they can do her Kyphoplasty when her plt is above 30K    3. Normocytic anemia, moderate  -H/o pernicious anemia/vitamin B12 deficiency -Transfuse for Hgb </= 7.5 -Continue oral iron    4. Comorbidities: DM type 2 with polyneuropathy, HTN, h/o panic attack, headaches -Continue medication, follow-up PCP -Etiology of headaches unknown, managed with Tylenol -panic attack occurred once, managed with family support and lorazepam.     PLAN: -proceed with Nplate, and start cycle 6 azacitidine today -continue azacitidine daily until this Friday -lab and plt transfusion on Mon and Thurs weekly for plt<15K  -azacitidine injection 1/9-1/13 -f/u 1/9 -zometa infusion if OK with IR (if does not interfere Kyphoplasty) -will reach out to IR aobut her Kyphoplasty procedure when her plt above 30K  -I communicated with Dr. Linus Orn today    No problem-specific Assessment & Plan notes found for this encounter.   SUMMARY OF ONCOLOGIC HISTORY: Oncology History  [name] (chronic myelomonocytic leukemia) ([place])  10/29/2020 Imaging   CT CAP  IMPRESSION: 1. Splenomegaly with pathologically enlarged lymph nodes above and below the diaphragm, with overall stable to minimally increased abdominal adenopathy and interval progression of the pelvic adenopathy. 2. Small volume abdominopelvic ascites with diffuse mesenteric stranding. 3. Scattered bilateral pulmonary micro nodules measuring 1-2 mm. 4. Distended gallbladder with some layering hyperdense material representing layering sludge and tiny stones seen on prior ultrasound. 5. Aortic atherosclerosis.   10/30/2020 Pathology Results   DIAGNOSIS:   BONE MARROW, ASPIRATE, CLOT, CORE:  -  Hypercellular bone marrow with panhyperplasia, atypia, and no  increase in blasts  -  See comment   PERIPHERAL BLOOD:  -  Marked thrombocytopenia  -  Absolute  monocytosis  -  Normocytic anemia  -  See CBC data and comment   COMMENT:  The bone marrow is hypercellular for age (approximately 80%) with myeloid hyperplasia with maturational left shift, erythroid hyperplasia, and increased megakaryocytes.  Mild multilineage atypia is present. Blasts are not increased on aspirate smears (1% by manual differential count) or by CD34 immunohistochemistry on the core biopsy.  Concurrent flow cytometric analysis of the bone marrow aspirate demonstrates increased monocytes, and no increase in blasts or abnormal lymphoid population (see [id number]).  Monocytes are also increased in peripheral  blood, persistent per electronic medical record.  In aggregate, the  findings raise the possibility of a myeloid neoplasm with the  differential diagnosis including a low-grade myelodysplastic syndrome and chronic myelomonocytic leukemia (dysplastic type).    ADDENDUM:  A reticulin special stain performed on the bone marrow core biopsy reveals no significant increase in reticulin fibrosis.  ADDENDUM:  CYTOGENETIC RESULTS:  Karyotype: 46,XX[20]  Interpretation: NORMAL FEMALE KARYOTYPE   FISH RESULTS:  Results: NORMAL   ADDENDUM:  CD123 immunohistochemistry performed on the core biopsy highlights scattered aggregates of positively staining cells consistent with plasmacytoid dendritic cells.    10/30/2020 Pathology Results   DIAGNOSIS:   BONE MARROW; FLOW CYTOMETRIC ANALYSIS:  -  Increased monocytes  -  Scant B-cells present  -  No immunophenotypically aberrant T-cell population identified  -  No increase in blasts  -  See comment   COMMENT:  Monocytes are relatively increased (12% of all cells), without aberrant expression of CD56.  B-cells comprise <1% of total lymphocytes. CD34-positive blasts are not increased (<1% of all cells).  Correlation with concurrent morphology is recommended for complete diagnostic interpretation and overall blast enumeration (see  [id number]).    10/30/2020 Pathology Results   FINAL MICROSCOPIC DIAGNOSIS:   A. LYMPH NODE, RIGHT AXILLARY, NEEDLE CORE BIOPSY:  -Lymphoid tissue present  -See comment   COMMENT:  The sections show several small needle core biopsy fragments of lymphoid tissue displaying degenerative cellular changes/necrosis and hence cannot be accurately evaluated.  Sample for flow cytometric analysis not available.  Immunohistochemical stain for CD3 and CD20 were performed with appropriate controls.  There is a mixture of T and B cells in their apparently respective compartments.  There is no definite metastatic malignancy.    11/11/2020 Pathology Results   DIAGNOSIS:   LEFT AXILLARY LYMPH NODE EXCISIONAL BIOPSY; FLOW CYTOMETRIC ANALYSIS:  -  No monotypic B-cell or immunophenotypically aberrant T-cell  population identified  -  See comment   COMMENT:  Flow cytometric analysis identifies B-cells with a normal kappa:lambda ratio of 1.7:1.  A subset of the polytypic B-cells expresses CD10. T-cells show a CD4:CD8 ratio of 3.3:1 without immunophenotypic aberrancy with the markers evaluated.  Although these results do not support the diagnosis of a clonal lymphoid population, sampling issues must always be considered when negative results are obtained, as focal lesions may not be represented in the specimen submitted.  In addition, flow cytometric immunophenotyping will not exclude other pathology if present (e.g. Hodgkin lymphoma, some T-cell lymphomas, metastatic and infectious diseases).   11/11/2020 Pathology Results   FINAL MICROSCOPIC DIAGNOSIS:   A. LYMPH NODE, LEFT AXILLARY, DISSECTION:  -  Follicular hyperplasia with interfollicular expansion  -  See comment   COMMENT:  Sections of the lymph nodes reveal generally preserved lymph node architecture.  There is follicular hyperplasia with some follicles showing  increased hyalinization of germinal centers and mild concentric encircling of germinal centers  with small lymphocytes (onion skinning). Interfollicular areas are expanded with increased vascular proliferation, small lymphocytes, plasma cells, histiocytic cells, and patchy neutrophils.  The lymph node capsule is variably thickened by fibrosis with few plasma cells.   A panel of immunohistochemical stains is performed for further  characterization.  CD3 and CD20/PAX5 highlight T-cell and B-cell  compartments, respectively.  Germinal centers are highlighted by CD10 and BCL6 with appropriate absent expression of BCL2.  CD5 is similar to CD3.  CD43 also stains the T-cells.  CD4-positive T-cells exceed CD8-positive T-cells.  CD30 highlights scattered immunoblasts.  CD21 reveals intact follicular dendritic cell meshworks.  CD68 highlights increased histiocytic cells.  HHV 8 is negative.  T. pallidum reveals no definitive organisms.  Pancytokeratin is negative.  TdT stains rare cells.  CD138 highlights plasma cells which are not increased in number and show polytypic light chain expression by kappa/lambda in situ  hybridization.  EBV is negative by in situ hybridization.   Concurrent flow cytometric analysis is negative for a monoclonal B-cell or immunophenotypically aberrant T-cell population (see [phone]).   Together, the findings above are non-specific and can be seen in  reactive and infectious processes as well as Castleman disease.  There is no definitive morphologic or flow cytometric evidence of involvement by a lymphoproliferative disorder from the current workup.   12/17/2020 Initial Diagnosis   [name] (chronic myelomonocytic leukemia) ([place])   12/28/2020 -  Chemotherapy   Patient is on Treatment Plan : MYELODYSPLASIA  Azacitidine IV D1-5 q28d     03/18/2021 Pathology Results   Final Diagnosis    BONE MARROW:             Persistent chronic myelomonocytic leukemia in a hypercellular bone marrow with increased monocytes and no increase in blasts.   Comment    Flow cytometric analysis  showed no increase in blasts and 17% monocytes.A next generation myeloid panel showed the presence of the following mutations: NRAS, SH2B3, PHF6 and TET2. CD34 and CD117 immunstains do not show an increase in blasts. The findings are consistent with persistent [name] with a decrease in the number of blasts compared to that seen on the previous bone marrow.    Final Interpretation      BONE MARROW; FLOW CYTOMETRIC ANALYSIS:   No increased blasts identified. (see comment)  Monocytosis (17% of total events).    COMMENT: Flow cytometry identified about 0.6% of total events as immature cells. These cells express CD45 (dim), CD34, CD117, HLA-DR, CD38, CD33 (variable). This immunophenotype is consistent with normal myeloblasts. In addition, monocytes are increased and comprise of approximately 17% of total events. These monocytes show normal expression of CD33/CD64/CD14/CD11b with variable expression of CD4. Correlation with concurrent morphology and clinical data is recommended (see [id number]).    FLOW CYTOMETRY ANALYSIS: CD45 versus side scatter analysis demonstrate a predominance of granulocytes (~70%), monocytes (~17%) and lymphocytes (~6%). No significant increase in blasts identified. All tested markers were used for adequate analysis of the cells and were appropriately reviewed.        INTERVAL HISTORY:  Kathleen Gibson is here for a follow up of [name]. She was last seen by me on 04/26/21. She presents to the clinic alone. She reports she had a good vacation to [place]. She notes her fatigue is the same, and her back pain is overall controlled. She expressed some concern with what she was told about her back surgery, but she would  still like to move forward to improve her pain. She asked questions about her family donating platelets. She notes her son wants to donate for her.   All other systems were reviewed with the patient and are negative.  MEDICAL HISTORY:  Past Medical History:  Diagnosis  Date   Diabetes ([place])    HLD (hyperlipidemia)    Hypertension    Pernicious anemia    Pneumonia 09/05/2019   Renal insufficiency 09/06/2019   Vitamin D deficiency     SURGICAL HISTORY: Past Surgical History:  Procedure Laterality Date   ABDOMINAL HERNIA REPAIR  2009   ABDOMINOPLASTY     AXILLARY LYMPH NODE BIOPSY Left 11/11/2020   Procedure: LEFT AXILLARY EXCISIONAL LYMPH NODE BIOPSY;  Surgeon: [name], MD;  Location: [facility];  Service: General;  Laterality: Left;   [place]   hemorroid surgery  2007   Iron    I have reviewed the social history and family history with the patient and they are unchanged from previous note.  ALLERGIES:  is allergic to asa [aspirin] and nsaids.  MEDICATIONS:  Current Outpatient Medications  Medication Sig Dispense Refill   acetaminophen (TYLENOL) 500 MG tablet Take 2 tablets (1,000 mg total) by mouth every 8 (eight) hours. 30 tablet 0   bisacodyl (DULCOLAX) 10 MG suppository Place 1 suppository (10 mg total) rectally daily as needed for moderate constipation (Please take-if no bowel movement in 1-2 days with MiraLAX/senna). 12 suppository 0   gabapentin (NEURONTIN) 300 MG capsule Take 2 capsules (600 mg total) by mouth 2 (two) times daily. 360 capsule 1   LORazepam (ATIVAN) 1 MG tablet Take 1 tablet (1 mg total) by mouth every 8 (eight) hours as needed for anxiety. 30 tablet 0   metoprolol succinate (TOPROL-XL) 25 MG 24 hr tablet Take 1 tablet (25 mg total) by mouth daily. 90 tablet 1   naloxone (NARCAN) nasal spray 4 mg/0.1 mL Place 1 spray into the nose once as needed (overdose). 2 each 0   Oxycodone HCl 10 MG TABS 1 tab every 4-6 hr prn for chronic pain 120 tablet 0   polyethylene glycol powder (GLYCOLAX/MIRALAX) 17 GM/SCOOP powder Dissolve 1 capful (17 g) in water and drink 2 (two) times daily. 510 g 2   senna-docusate (SENOKOT-S) 8.6-50 MG tablet Take 2 tablets by mouth at  bedtime. 60 tablet 2   sitaGLIPtin-metformin (JANUMET) 50-1000 MG tablet Take 1 tablet by mouth at bedtime. 90 tablet 1   Current Facility-Administered Medications  Medication Dose Route Frequency Provider Last Rate Last Admin   cyanocobalamin ((VITAMIN B-12)) injection 1,000 mcg  1,000 mcg Intramuscular Once Kuneff, Renee A, DO       Facility-Administered Medications Ordered in Other Visits  Medication Dose Route Frequency Provider Last Rate Last Admin   azaCITIDine (VIDAZA) 135 mg in sodium chloride 0.9 % 50 mL chemo infusion  75 mg/m2 (Treatment Plan Recorded) Intravenous Once Truitt Merle, MD       heparin lock flush 100 unit/mL  250 Units Intracatheter Once PRN Truitt Merle, MD       sodium chloride flush (NS) 0.9 % injection 10 mL  10 mL Intracatheter PRN Truitt Merle, MD        PHYSICAL EXAMINATION: ECOG PERFORMANCE STATUS: 2 - Symptomatic, <50% confined to bed  Vitals:   05/24/21 0901  BP: 127/70  Pulse: 88  Resp: 17  Temp: 98 F (36.7 C)  SpO2: 98%  Wt Readings from Last 3 Encounters:  05/24/21 147 lb 4.8 oz (66.8 kg)  05/17/21 147 lb (66.7 kg)  05/12/21 140 lb 4 oz (63.6 kg)     GENERAL:alert, no distress and comfortable SKIN: skin color normal, no rashes or significant lesions EYES: normal, Conjunctiva are pink and non-injected, sclera clear  NEURO: alert & oriented x 3 with fluent speech  LABORATORY DATA:  I have reviewed the data as listed CBC Latest Ref Rng & Units 05/24/2021 05/17/2021 05/14/2021  WBC 4.0 - 10.5 K/uL 5.3 6.7 10.5  Hemoglobin 12.0 - 15.0 g/dL 7.9(L) 8.1(L) 8.8(L)  Hematocrit 36.0 - 46.0 % 25.6(L) 26.0(L) 28.6(L)  Platelets 150 - 400 K/uL 24(L) <5(LL) 5(LL)     CMP Latest Ref Rng & Units 05/24/2021 05/17/2021 05/14/2021  Glucose 70 - 99 mg/dL 125(H) 175(H) 180(H)  BUN 8 - 23 mg/dL $Remove'17 16 16  'QnGCyGj$ Creatinine 0.44 - 1.00 mg/dL 0.72 0.75 0.77  Sodium 135 - 145 mmol/L 140 139 138  Potassium 3.5 - 5.1 mmol/L 4.1 4.1 4.3  Chloride 98 - 111 mmol/L 109 107 107   CO2 22 - 32 mmol/L $RemoveB'24 25 24  'foLknqpc$ Calcium 8.9 - 10.3 mg/dL 8.0(L) 8.2(L) 8.0(L)  Total Protein 6.5 - 8.1 g/dL 6.5 6.5 6.8  Total Bilirubin 0.3 - 1.2 mg/dL 0.7 0.6 0.7  Alkaline Phos 38 - 126 U/L 159(H) 145(H) 148(H)  AST 15 - 41 U/L 17 15 14(L)  ALT 0 - 44 U/L $Remo'17 14 14      'xIwuZ$ RADIOGRAPHIC STUDIES: I have personally reviewed the radiological images as listed and agreed with the findings in the report. No results found.    No orders of the defined types were placed in this encounter.  All questions were answered. The patient knows to call the clinic with any problems, questions or concerns. No barriers to learning was detected. The total time spent in the appointment was 30 minutes.     Truitt Merle, MD 05/24/2021   I, Wilburn Mylar, am acting as scribe for Truitt Merle, MD.   I have reviewed the above documentation for accuracy and completeness, and I agree with the above.

## 2021-05-24 NOTE — Progress Notes (Signed)
Per Dr. Burr Medico, holding 1unit of PRBCs today since pt is getting Nplate and Vidaza this week.

## 2021-05-24 NOTE — Progress Notes (Signed)
Per Dr. Burr Medico, "OK To treat today w/Hbg 7.9"

## 2021-05-25 ENCOUNTER — Telehealth: Payer: Self-pay

## 2021-05-25 ENCOUNTER — Inpatient Hospital Stay: Payer: Medicare HMO

## 2021-05-25 VITALS — BP 120/60 | HR 90 | Temp 98.0°F | Resp 16

## 2021-05-25 DIAGNOSIS — C9310 Chronic myelomonocytic leukemia not having achieved remission: Secondary | ICD-10-CM | POA: Diagnosis not present

## 2021-05-25 DIAGNOSIS — Z5111 Encounter for antineoplastic chemotherapy: Secondary | ICD-10-CM | POA: Diagnosis not present

## 2021-05-25 DIAGNOSIS — E119 Type 2 diabetes mellitus without complications: Secondary | ICD-10-CM | POA: Diagnosis not present

## 2021-05-25 DIAGNOSIS — Z79899 Other long term (current) drug therapy: Secondary | ICD-10-CM | POA: Diagnosis not present

## 2021-05-25 DIAGNOSIS — R69 Illness, unspecified: Secondary | ICD-10-CM | POA: Diagnosis not present

## 2021-05-25 DIAGNOSIS — E538 Deficiency of other specified B group vitamins: Secondary | ICD-10-CM | POA: Diagnosis not present

## 2021-05-25 DIAGNOSIS — D649 Anemia, unspecified: Secondary | ICD-10-CM | POA: Diagnosis not present

## 2021-05-25 DIAGNOSIS — D693 Immune thrombocytopenic purpura: Secondary | ICD-10-CM | POA: Diagnosis not present

## 2021-05-25 DIAGNOSIS — C9000 Multiple myeloma not having achieved remission: Secondary | ICD-10-CM

## 2021-05-25 DIAGNOSIS — Z23 Encounter for immunization: Secondary | ICD-10-CM | POA: Diagnosis not present

## 2021-05-25 DIAGNOSIS — I10 Essential (primary) hypertension: Secondary | ICD-10-CM | POA: Diagnosis not present

## 2021-05-25 MED ORDER — SODIUM CHLORIDE 0.9 % IV SOLN
75.0000 mg/m2 | Freq: Once | INTRAVENOUS | Status: AC
  Administered 2021-05-25: 135 mg via INTRAVENOUS
  Filled 2021-05-25: qty 13.5

## 2021-05-25 MED ORDER — SODIUM CHLORIDE 0.9 % IV SOLN: Freq: Once | INTRAVENOUS | Status: AC

## 2021-05-25 MED ORDER — SODIUM CHLORIDE 0.9 % IV SOLN
10.0000 mg | Freq: Once | INTRAVENOUS | Status: AC
  Administered 2021-05-25: 10 mg via INTRAVENOUS
  Filled 2021-05-25: qty 10

## 2021-05-25 MED FILL — Dexamethasone Sodium Phosphate Inj 100 MG/10ML: INTRAMUSCULAR | Qty: 1 | Status: AC

## 2021-05-25 NOTE — Telephone Encounter (Signed)
Tried reaching pt on telephone numbers regarding doctor's appt with Dr. Debbrah Alar.  Pt did not answer therefore called pt's daughter and informed her that Dr. Burr Medico would like for the pt to keep her appt with Dr. Laymond Purser for the Kyphoplasty placement.  Dr. Burr Medico stated that the coordination of care would be easier with Dr. Laymond Purser since he's with [facility].  Pt's daughter stated she will let her mom know of Dr. Ernestina Penna recommendation.

## 2021-05-26 ENCOUNTER — Other Ambulatory Visit: Payer: Self-pay

## 2021-05-26 ENCOUNTER — Inpatient Hospital Stay: Payer: Medicare HMO

## 2021-05-26 VITALS — BP 118/58 | HR 93 | Temp 98.0°F | Resp 17

## 2021-05-26 DIAGNOSIS — C9310 Chronic myelomonocytic leukemia not having achieved remission: Secondary | ICD-10-CM | POA: Diagnosis not present

## 2021-05-26 DIAGNOSIS — E538 Deficiency of other specified B group vitamins: Secondary | ICD-10-CM | POA: Diagnosis not present

## 2021-05-26 DIAGNOSIS — Z5111 Encounter for antineoplastic chemotherapy: Secondary | ICD-10-CM | POA: Diagnosis not present

## 2021-05-26 DIAGNOSIS — R69 Illness, unspecified: Secondary | ICD-10-CM | POA: Diagnosis not present

## 2021-05-26 DIAGNOSIS — Z23 Encounter for immunization: Secondary | ICD-10-CM | POA: Diagnosis not present

## 2021-05-26 DIAGNOSIS — Z79899 Other long term (current) drug therapy: Secondary | ICD-10-CM | POA: Diagnosis not present

## 2021-05-26 DIAGNOSIS — D649 Anemia, unspecified: Secondary | ICD-10-CM | POA: Diagnosis not present

## 2021-05-26 DIAGNOSIS — E119 Type 2 diabetes mellitus without complications: Secondary | ICD-10-CM | POA: Diagnosis not present

## 2021-05-26 DIAGNOSIS — I10 Essential (primary) hypertension: Secondary | ICD-10-CM | POA: Diagnosis not present

## 2021-05-26 DIAGNOSIS — D693 Immune thrombocytopenic purpura: Secondary | ICD-10-CM | POA: Diagnosis not present

## 2021-05-26 MED ORDER — SODIUM CHLORIDE 0.9 % IV SOLN
Freq: Once | INTRAVENOUS | Status: AC
Start: 2021-05-26 — End: 2021-05-26

## 2021-05-26 MED ORDER — HEPARIN SOD (PORK) LOCK FLUSH 100 UNIT/ML IV SOLN
250.0000 [IU] | Freq: Once | INTRAVENOUS | Status: AC | PRN
  Administered 2021-05-26: 10:00:00 250 [IU]

## 2021-05-26 MED ORDER — PALONOSETRON HCL INJECTION 0.25 MG/5ML
0.2500 mg | Freq: Once | INTRAVENOUS | Status: AC
  Administered 2021-05-26: 09:00:00 0.25 mg via INTRAVENOUS
  Filled 2021-05-26: qty 5

## 2021-05-26 MED ORDER — DEXAMETHASONE SODIUM PHOSPHATE 100 MG/10ML IJ SOLN
10.0000 mg | Freq: Once | INTRAMUSCULAR | Status: AC
  Administered 2021-05-26: 09:00:00 10 mg via INTRAVENOUS
  Filled 2021-05-26: qty 10

## 2021-05-26 MED ORDER — SODIUM CHLORIDE 0.9 % IV SOLN
75.0000 mg/m2 | Freq: Once | INTRAVENOUS | Status: AC
  Administered 2021-05-26: 10:00:00 135 mg via INTRAVENOUS
  Filled 2021-05-26: qty 13.5

## 2021-05-26 MED ORDER — SODIUM CHLORIDE 0.9% FLUSH
10.0000 mL | INTRAVENOUS | Status: DC | PRN
  Administered 2021-05-26: 10:00:00 10 mL

## 2021-05-26 NOTE — Patient Instructions (Signed)
[facility]COL[facility]  Discharge Instructions: Thank you for choosing [facility] to provide your oncology and hematology care.   If you have a lab appointment with the Cancer Center, please go directly to the Cancer Center and check in at the registration area.   Wear comfortable clothing and clothing appropriate for easy access to any Portacath or PICC line.   We strive to give you quality time with your provider. You may need to reschedule your appointment if you arrive late (15 or more minutes).  Arriving late affects you and other patients whose appointments are after yours.  Also, if you miss three or more appointments without notifying the office, you may be dismissed from the clinic at the provider's discretion.      For prescription refill requests, have your pharmacy contact our office and allow 72 hours for refills to be completed.   Today you received the following chemotherapy and/or immunotherapy agents Vidaza     To help prevent nausea and vomiting after your treatment, we encourage you to take your nausea medication as directed.  BELOW ARE SYMPTOMS THAT SHOULD BE REPORTED IMMEDIATELY: *FEVER GREATER THAN 100.4 F (38 C) OR HIGHER *CHILLS OR SWEATING *NAUSEA AND VOMITING THAT IS NOT CONTROLLED WITH YOUR NAUSEA MEDICATION *UNUSUAL SHORTNESS OF BREATH *UNUSUAL BRUISING OR BLEEDING *URINARY PROBLEMS (pain or burning when urinating, or frequent urination) *BOWEL PROBLEMS (unusual diarrhea, constipation, pain near the anus) TENDERNESS IN MOUTH AND THROAT WITH OR WITHOUT PRESENCE OF ULCERS (sore throat, sores in mouth, or a toothache) UNUSUAL RASH, SWELLING OR PAIN  UNUSUAL VAGINAL DISCHARGE OR ITCHING   Items with * indicate a potential emergency and should be followed up as soon as possible or go to the Emergency Department if any problems should occur.  Please show the CHEMOTHERAPY ALERT CARD or IMMUNOTHERAPY ALERT CARD at check-in to the  Emergency Department and triage nurse.  Should you have questions after your visit or need to cancel or reschedule your appointment, please contact [facility]  Dept: [phone]  and follow the prompts.  Office hours are 8:00 a.m. to 4:30 p.m. Monday - Friday. Please note that voicemails left after 4:00 p.m. may not be returned until the following business day.  We are closed weekends and major holidays. You have access to a nurse at all times for urgent questions. Please call the main number to the clinic Dept: [phone] and follow the prompts.   For any non-urgent questions, you may also contact your provider using MyChart. We now offer e-Visits for anyone 18 and older to request care online for non-urgent symptoms. For details visit [email or web address].   Also download the MyChart app! Go to the app store, search "MyChart", open the app, select [facility], and log in with your MyChart username and password.  Due to Covid, a mask is required upon entering the hospital/clinic. If you do not have a mask, one will be given to you upon arrival. For doctor visits, patients may have 1 support person aged 18 or older with them. For treatment visits, patients cannot have anyone with them due to current Covid guidelines and our immunocompromised population.   

## 2021-05-26 NOTE — Progress Notes (Signed)
No zometa this week per Dr Burr Medico - waiting for approval from IR- if does not interfere Kyphoplasty

## 2021-05-26 NOTE — Progress Notes (Signed)
LVM informing pt that she has MRI of the Thoracic Spine w/contrast ordered that Dr. Burr Medico would like completed by 06/04/2021.  Informed pt that the appt is scheduled on 06/01/2021 at [facility] at 6:30pm.  Gave pt the telephone number to Central Scheduling [phone]) should this appt time need to be changed but did inform pt that this is the earliest appt available before 06/04/2021.  Instructed pt to contact Dr. Ernestina Penna office should she have additional questions or concerns.

## 2021-05-27 ENCOUNTER — Other Ambulatory Visit: Payer: Self-pay

## 2021-05-27 ENCOUNTER — Inpatient Hospital Stay: Payer: Medicare HMO

## 2021-05-27 ENCOUNTER — Telehealth: Payer: Self-pay | Admitting: Hematology

## 2021-05-27 VITALS — BP 118/57 | HR 81 | Resp 17 | Wt 145.0 lb

## 2021-05-27 DIAGNOSIS — Z23 Encounter for immunization: Secondary | ICD-10-CM | POA: Diagnosis not present

## 2021-05-27 DIAGNOSIS — Z5111 Encounter for antineoplastic chemotherapy: Secondary | ICD-10-CM | POA: Diagnosis not present

## 2021-05-27 DIAGNOSIS — I10 Essential (primary) hypertension: Secondary | ICD-10-CM | POA: Diagnosis not present

## 2021-05-27 DIAGNOSIS — Z452 Encounter for adjustment and management of vascular access device: Secondary | ICD-10-CM

## 2021-05-27 DIAGNOSIS — Z95828 Presence of other vascular implants and grafts: Secondary | ICD-10-CM

## 2021-05-27 DIAGNOSIS — C9310 Chronic myelomonocytic leukemia not having achieved remission: Secondary | ICD-10-CM

## 2021-05-27 DIAGNOSIS — E538 Deficiency of other specified B group vitamins: Secondary | ICD-10-CM | POA: Diagnosis not present

## 2021-05-27 DIAGNOSIS — R69 Illness, unspecified: Secondary | ICD-10-CM | POA: Diagnosis not present

## 2021-05-27 DIAGNOSIS — E119 Type 2 diabetes mellitus without complications: Secondary | ICD-10-CM | POA: Diagnosis not present

## 2021-05-27 DIAGNOSIS — D696 Thrombocytopenia, unspecified: Secondary | ICD-10-CM

## 2021-05-27 DIAGNOSIS — Z79899 Other long term (current) drug therapy: Secondary | ICD-10-CM | POA: Diagnosis not present

## 2021-05-27 DIAGNOSIS — D649 Anemia, unspecified: Secondary | ICD-10-CM | POA: Diagnosis not present

## 2021-05-27 DIAGNOSIS — D693 Immune thrombocytopenic purpura: Secondary | ICD-10-CM | POA: Diagnosis not present

## 2021-05-27 LAB — CBC WITH DIFFERENTIAL (CANCER CENTER ONLY)
Abs Immature Granulocytes: 0.09 10*3/uL — ABNORMAL HIGH (ref 0.00–0.07)
Basophils Absolute: 0 10*3/uL (ref 0.0–0.1)
Basophils Relative: 1 %
Eosinophils Absolute: 0 10*3/uL (ref 0.0–0.5)
Eosinophils Relative: 0 %
HCT: 25.1 % — ABNORMAL LOW (ref 36.0–46.0)
Hemoglobin: 7.7 g/dL — ABNORMAL LOW (ref 12.0–15.0)
Immature Granulocytes: 2 %
Lymphocytes Relative: 16 %
Lymphs Abs: 0.8 10*3/uL (ref 0.7–4.0)
MCH: 25.5 pg — ABNORMAL LOW (ref 26.0–34.0)
MCHC: 30.7 g/dL (ref 30.0–36.0)
MCV: 83.1 fL (ref 80.0–100.0)
Monocytes Absolute: 0.9 10*3/uL (ref 0.1–1.0)
Monocytes Relative: 18 %
Neutro Abs: 3.1 10*3/uL (ref 1.7–7.7)
Neutrophils Relative %: 63 %
Platelet Count: 39 10*3/uL — ABNORMAL LOW (ref 150–400)
RBC: 3.02 MIL/uL — ABNORMAL LOW (ref 3.87–5.11)
RDW: 17.2 % — ABNORMAL HIGH (ref 11.5–15.5)
WBC Count: 4.8 10*3/uL (ref 4.0–10.5)
nRBC: 0 % (ref 0.0–0.2)

## 2021-05-27 LAB — CMP (CANCER CENTER ONLY)
ALT: 15 U/L (ref 0–44)
AST: 17 U/L (ref 15–41)
Albumin: 3.1 g/dL — ABNORMAL LOW (ref 3.5–5.0)
Alkaline Phosphatase: 137 U/L — ABNORMAL HIGH (ref 38–126)
Anion gap: 7 (ref 5–15)
BUN: 15 mg/dL (ref 8–23)
CO2: 27 mmol/L (ref 22–32)
Calcium: 8.4 mg/dL — ABNORMAL LOW (ref 8.9–10.3)
Chloride: 106 mmol/L (ref 98–111)
Creatinine: 0.73 mg/dL (ref 0.44–1.00)
GFR, Estimated: >60 mL/min (ref >60)
Glucose, Bld: 138 mg/dL — ABNORMAL HIGH (ref 70–99)
Potassium: 3.9 mmol/L (ref 3.5–5.1)
Sodium: 140 mmol/L (ref 135–145)
Total Bilirubin: 0.6 mg/dL (ref 0.3–1.2)
Total Protein: 6.5 g/dL (ref 6.5–8.1)

## 2021-05-27 LAB — SAMPLE TO BLOOD BANK

## 2021-05-27 LAB — PREPARE RBC (CROSSMATCH)

## 2021-05-27 MED ORDER — SODIUM CHLORIDE 0.9% FLUSH
10.0000 mL | INTRAVENOUS | Status: AC | PRN
  Administered 2021-05-27: 10 mL

## 2021-05-27 MED ORDER — SODIUM CHLORIDE 0.9% FLUSH
10.0000 mL | Freq: Once | INTRAVENOUS | Status: AC
  Administered 2021-05-27: 10 mL via INTRAVENOUS

## 2021-05-27 MED ORDER — SODIUM CHLORIDE 0.9 % IV SOLN
10.0000 mg | Freq: Once | INTRAVENOUS | Status: AC
  Administered 2021-05-27: 10 mg via INTRAVENOUS
  Filled 2021-05-27: qty 10

## 2021-05-27 MED ORDER — HEPARIN SOD (PORK) LOCK FLUSH 100 UNIT/ML IV SOLN
250.0000 [IU] | INTRAVENOUS | Status: AC | PRN
  Administered 2021-05-27: 250 [IU]

## 2021-05-27 MED ORDER — SODIUM CHLORIDE 0.9 % IV SOLN
Freq: Once | INTRAVENOUS | Status: AC
Start: 2021-05-27 — End: 2021-05-27

## 2021-05-27 MED ORDER — SODIUM CHLORIDE 0.9% IV SOLUTION: 250.0000 mL | Freq: Once | INTRAVENOUS | Status: DC

## 2021-05-27 MED ORDER — SODIUM CHLORIDE 0.9 % IV SOLN
75.0000 mg/m2 | Freq: Once | INTRAVENOUS | Status: AC
  Administered 2021-05-27: 135 mg via INTRAVENOUS
  Filled 2021-05-27: qty 13.5

## 2021-05-27 MED ORDER — SODIUM CHLORIDE 0.9% FLUSH: 10.0000 mL | INTRAVENOUS | Status: DC | PRN

## 2021-05-27 MED FILL — Dexamethasone Sodium Phosphate Inj 100 MG/10ML: INTRAMUSCULAR | Qty: 1 | Status: AC

## 2021-05-27 NOTE — Progress Notes (Signed)
Called and got pt's MRI rescheduled for this weekend per Dr. Ernestina Penna request.  New appt is now 05/29/2021 @0830 

## 2021-05-27 NOTE — Progress Notes (Signed)
Per Dr. Burr Medico, "OK To Treat w/Hbg 7.7 today"

## 2021-05-27 NOTE — Telephone Encounter (Signed)
Left message with follow-up appointments per 12/12 los.

## 2021-05-27 NOTE — Progress Notes (Signed)
Notified by MD, patient will receive 1 unit RBCs no platelets. Per blood bank, will take over 1 hr to prepare.  So patient scheduled to come back tomorrow 12/16 for blood, she is aware and verbalizes understanding. Patient did tolerate azaCITIDine infusion well today, no concerns voiced. Patient discharged. Stable.

## 2021-05-27 NOTE — Patient Instructions (Signed)
[facility]  Discharge Instructions: Thank you for choosing [facility] to provide your oncology and hematology care.   If you have a lab appointment with the [facility], please go directly to the [facility] and check in at the registration area.   Wear comfortable clothing and clothing appropriate for easy access to any Portacath or PICC line.   We strive to give you quality time with your provider. You may need to reschedule your appointment if you arrive late (15 or more minutes).  Arriving late affects you and other patients whose appointments are after yours.  Also, if you miss three or more appointments without notifying the office, you may be dismissed from the clinic at the providers discretion.      For prescription refill requests, have your pharmacy contact our office and allow 72 hours for refills to be completed.    Today you received the following chemotherapy and/or immunotherapy agents :Azacitidine   To help prevent nausea and vomiting after your treatment, we encourage you to take your nausea medication as directed.  BELOW ARE SYMPTOMS THAT SHOULD BE REPORTED IMMEDIATELY: *FEVER GREATER THAN 100.4 F (38 C) OR HIGHER *CHILLS OR SWEATING *NAUSEA AND VOMITING THAT IS NOT CONTROLLED WITH YOUR NAUSEA MEDICATION *UNUSUAL SHORTNESS OF BREATH *UNUSUAL BRUISING OR BLEEDING *URINARY PROBLEMS (pain or burning when urinating, or frequent urination) *BOWEL PROBLEMS (unusual diarrhea, constipation, pain near the anus) TENDERNESS IN MOUTH AND THROAT WITH OR WITHOUT PRESENCE OF ULCERS (sore throat, sores in mouth, or a toothache) UNUSUAL RASH, SWELLING OR PAIN  UNUSUAL VAGINAL DISCHARGE OR ITCHING   Items with * indicate a potential emergency and should be followed up as soon as possible or go to the Emergency Department if any problems should occur.  Please show the CHEMOTHERAPY ALERT CARD or IMMUNOTHERAPY ALERT CARD at check-in to  the Emergency Department and triage nurse.  Should you have questions after your visit or need to cancel or reschedule your appointment, please contact [facility]  Dept: [phone]  and follow the prompts.  Office hours are 8:00 a.m. to 4:30 p.m. Monday - Friday. Please note that voicemails left after 4:00 p.m. may not be returned until the following business day.  We are closed weekends and major holidays. You have access to a nurse at all times for urgent questions. Please call the main number to the clinic Dept: [phone] and follow the prompts.   For any non-urgent questions, you may also contact your provider using MyChart. We now offer e-Visits for anyone 42 and older to request care online for non-urgent symptoms. For details visit [email or web address].   Also download the MyChart app! Go to the app store, search "MyChart", open the app, select [facility], and log in with your MyChart username and password.  Due to Covid, a mask is required upon entering the hospital/clinic. If you do not have a mask, one will be given to you upon arrival. For doctor visits, patients may have 1 support person aged 1 or older with them. For treatment visits, patients cannot have anyone with them due to current Covid guidelines and our immunocompromised population.

## 2021-05-28 ENCOUNTER — Inpatient Hospital Stay: Payer: Medicare HMO

## 2021-05-28 ENCOUNTER — Other Ambulatory Visit: Payer: Self-pay

## 2021-05-28 ENCOUNTER — Other Ambulatory Visit (HOSPITAL_COMMUNITY): Payer: Self-pay | Admitting: Neuroradiology

## 2021-05-28 ENCOUNTER — Encounter (HOSPITAL_COMMUNITY): Payer: Self-pay

## 2021-05-28 VITALS — BP 119/62 | HR 93 | Temp 97.9°F | Resp 18

## 2021-05-28 DIAGNOSIS — I10 Essential (primary) hypertension: Secondary | ICD-10-CM | POA: Diagnosis not present

## 2021-05-28 DIAGNOSIS — C9310 Chronic myelomonocytic leukemia not having achieved remission: Secondary | ICD-10-CM

## 2021-05-28 DIAGNOSIS — D649 Anemia, unspecified: Secondary | ICD-10-CM | POA: Diagnosis not present

## 2021-05-28 DIAGNOSIS — D693 Immune thrombocytopenic purpura: Secondary | ICD-10-CM | POA: Diagnosis not present

## 2021-05-28 DIAGNOSIS — Z5111 Encounter for antineoplastic chemotherapy: Secondary | ICD-10-CM | POA: Diagnosis not present

## 2021-05-28 DIAGNOSIS — S22060A Wedge compression fracture of T7-T8 vertebra, initial encounter for closed fracture: Secondary | ICD-10-CM

## 2021-05-28 DIAGNOSIS — Z23 Encounter for immunization: Secondary | ICD-10-CM | POA: Diagnosis not present

## 2021-05-28 DIAGNOSIS — E119 Type 2 diabetes mellitus without complications: Secondary | ICD-10-CM | POA: Diagnosis not present

## 2021-05-28 DIAGNOSIS — C9000 Multiple myeloma not having achieved remission: Secondary | ICD-10-CM

## 2021-05-28 DIAGNOSIS — E538 Deficiency of other specified B group vitamins: Secondary | ICD-10-CM | POA: Diagnosis not present

## 2021-05-28 DIAGNOSIS — Z79899 Other long term (current) drug therapy: Secondary | ICD-10-CM | POA: Diagnosis not present

## 2021-05-28 DIAGNOSIS — R69 Illness, unspecified: Secondary | ICD-10-CM | POA: Diagnosis not present

## 2021-05-28 LAB — BPAM RBC
Blood Product Expiration Date: 202301122359
Unit Type and Rh: 5100

## 2021-05-28 LAB — TYPE AND SCREEN
ABO/RH(D): O POS
Antibody Screen: NEGATIVE
Unit division: 0

## 2021-05-28 MED ORDER — SODIUM CHLORIDE 0.9 % IV SOLN
10.0000 mg | Freq: Once | INTRAVENOUS | Status: AC
  Administered 2021-05-28: 10 mg via INTRAVENOUS
  Filled 2021-05-28: qty 10

## 2021-05-28 MED ORDER — PALONOSETRON HCL INJECTION 0.25 MG/5ML
0.2500 mg | Freq: Once | INTRAVENOUS | Status: AC
  Administered 2021-05-28: 0.25 mg via INTRAVENOUS
  Filled 2021-05-28: qty 5

## 2021-05-28 MED ORDER — HEPARIN SOD (PORK) LOCK FLUSH 100 UNIT/ML IV SOLN
250.0000 [IU] | Freq: Once | INTRAVENOUS | Status: AC | PRN
  Administered 2021-05-28: 250 [IU]

## 2021-05-28 MED ORDER — SODIUM CHLORIDE 0.9 % IV SOLN: Freq: Once | INTRAVENOUS | Status: AC

## 2021-05-28 MED ORDER — SODIUM CHLORIDE 0.9% IV SOLUTION
250.0000 mL | Freq: Once | INTRAVENOUS | Status: AC
  Administered 2021-05-28: 250 mL via INTRAVENOUS

## 2021-05-28 MED ORDER — SODIUM CHLORIDE 0.9% FLUSH
3.0000 mL | INTRAVENOUS | Status: DC | PRN
  Administered 2021-05-28: 3 mL

## 2021-05-28 MED ORDER — SODIUM CHLORIDE 0.9 % IV SOLN
75.0000 mg/m2 | Freq: Once | INTRAVENOUS | Status: AC
  Administered 2021-05-28: 135 mg via INTRAVENOUS
  Filled 2021-05-28: qty 13.5

## 2021-05-28 NOTE — Patient Instructions (Signed)
[facility]  Discharge Instructions: Thank you for choosing [facility] to provide your oncology and hematology care.   If you have a lab appointment with the [facility], please go directly to the [facility] and check in at the registration area.   Wear comfortable clothing and clothing appropriate for easy access to any Portacath or PICC line.   We strive to give you quality time with your provider. You may need to reschedule your appointment if you arrive late (15 or more minutes).  Arriving late affects you and other patients whose appointments are after yours.  Also, if you miss three or more appointments without notifying the office, you may be dismissed from the clinic at the providers discretion.      For prescription refill requests, have your pharmacy contact our office and allow 72 hours for refills to be completed.    Today you received the following chemotherapy and/or immunotherapy agents: Azacitidine   To help prevent nausea and vomiting after your treatment, we encourage you to take your nausea medication as directed.  BELOW ARE SYMPTOMS THAT SHOULD BE REPORTED IMMEDIATELY: *FEVER GREATER THAN 100.4 F (38 C) OR HIGHER *CHILLS OR SWEATING *NAUSEA AND VOMITING THAT IS NOT CONTROLLED WITH YOUR NAUSEA MEDICATION *UNUSUAL SHORTNESS OF BREATH *UNUSUAL BRUISING OR BLEEDING *URINARY PROBLEMS (pain or burning when urinating, or frequent urination) *BOWEL PROBLEMS (unusual diarrhea, constipation, pain near the anus) TENDERNESS IN MOUTH AND THROAT WITH OR WITHOUT PRESENCE OF ULCERS (sore throat, sores in mouth, or a toothache) UNUSUAL RASH, SWELLING OR PAIN  UNUSUAL VAGINAL DISCHARGE OR ITCHING   Items with * indicate a potential emergency and should be followed up as soon as possible or go to the Emergency Department if any problems should occur.  Please show the CHEMOTHERAPY ALERT CARD or IMMUNOTHERAPY ALERT CARD at check-in to  the Emergency Department and triage nurse.  Should you have questions after your visit or need to cancel or reschedule your appointment, please contact [facility]  Dept: [phone]  and follow the prompts.  Office hours are 8:00 a.m. to 4:30 p.m. Monday - Friday. Please note that voicemails left after 4:00 p.m. may not be returned until the following business day.  We are closed weekends and major holidays. You have access to a nurse at all times for urgent questions. Please call the main number to the clinic Dept: [phone] and follow the prompts.   For any non-urgent questions, you may also contact your provider using MyChart. We now offer e-Visits for anyone 66 and older to request care online for non-urgent symptoms. For details visit [email or web address].   Also download the MyChart app! Go to the app store, search "MyChart", open the app, select [facility], and log in with your MyChart username and password.  Due to Covid, a mask is required upon entering the hospital/clinic. If you do not have a mask, one will be given to you upon arrival. For doctor visits, patients may have 1 support person aged 59 or older with them. For treatment visits, patients cannot have anyone with them due to current Covid guidelines and our immunocompromised population.   Blood Transfusion, Adult A blood transfusion is a procedure in which you receive blood through an IV tube. You may need this procedure because of: A bleeding disorder. An illness. An injury. A surgery. The blood may come from someone else (a donor). You may also be able to donate blood for  yourself. The blood given in a transfusion is made up of different types of cells. You may get: Red blood cells. These carry oxygen to the cells in the body. White blood cells. These help you fight infections. Platelets. These help your blood to clot. Plasma. This is the liquid part of your blood. It carries proteins  and other substances through the body. If you have a clotting disorder, you may also get other types of blood products. Tell your doctor about: Any blood disorders you have. Any reactions you have had during a blood transfusion in the past. Any allergies you have. All medicines you are taking, including vitamins, herbs, eye drops, creams, and over-the-counter medicines. Any surgeries you have had. Any medical conditions you have. This includes any recent fever or cold symptoms. Whether you are pregnant or may be pregnant. What are the risks? Generally, this is a safe procedure. However, problems may occur. The most common problems include: A mild allergic reaction. This includes red, swollen areas of skin (hives) and itching. Fever or chills. This may be the body's response to new blood cells received. This may happen during or up to 4 hours after the transfusion. More serious problems may include: Too much fluid in the lungs. This may cause breathing problems. A serious allergic reaction. This includes breathing trouble or swelling around the face and lips. Lung injury. This causes breathing trouble and low oxygen in the blood. This can happen within hours of the transfusion or days later. Too much iron. This can happen after getting many blood transfusions over a period of time. An infection or virus passed through the blood. This is rare. Donated blood is carefully tested before it is given. Your body's defense system (immune system) trying to attack the new blood cells. This is rare. Symptoms may include fever, chills, nausea, low blood pressure, and low back or chest pain. Donated cells attacking healthy tissues. This is rare. What happens before the procedure? Medicines Ask your doctor about: Changing or stopping your normal medicines. This is important. Taking aspirin and ibuprofen. Do not take these medicines unless your doctor tells you to take them. Taking over-the-counter  medicines, vitamins, herbs, and supplements. General instructions Follow instructions from your doctor about what you cannot eat or drink. You will have a blood test to find out your blood type. The test also finds out what type of blood your body will accept and matches it to the donor type. If you are going to have a planned surgery, you may be able to donate your own blood. This may be done in case you need a transfusion. You will have your temperature, blood pressure, and pulse checked. You may receive medicine to help prevent an allergic reaction. This may be done if you have had a reaction to a transfusion before. This medicine may be given to you by mouth or through an IV tube. This procedure lasts about 1-4 hours. Plan for the time you need. What happens during the procedure?  An IV tube will be put into one of your veins. The bag of donated blood will be attached to your IV tube. Then, the blood will enter through your vein. Your temperature, blood pressure, and pulse will be checked often. This is done to find early signs of a transfusion reaction. Tell your nurse right away if you have any of these symptoms: Shortness of breath or trouble breathing. Chest or back pain. Fever or chills. Red, swollen areas of skin or itching.  If you have any signs or symptoms of a reaction, your transfusion will be stopped. You may also be given medicine. When the transfusion is finished, your IV tube will be taken out. Pressure may be put on the IV site for a few minutes. A bandage (dressing) will be put on the IV site. The procedure may vary among doctors and hospitals. What happens after the procedure? You will be monitored until you leave the hospital or clinic. This includes checking your temperature, blood pressure, pulse, breathing rate, and blood oxygen level. Your blood may be tested to see how you are responding to the transfusion. You may be warmed with fluids or blankets. This is done  to keep the temperature of your body normal. If you have your procedure in an outpatient setting, you will be told whom to contact to report any reactions. Where to find more information To learn more, visit the [employer]: redcross.org Summary A blood transfusion is a procedure in which you are given blood through an IV tube. The blood may come from someone else (a donor). You may also be able to donate blood for yourself. The blood you are given is made up of different blood cells. You may receive red blood cells, platelets, plasma, or white blood cells. Your temperature, blood pressure, and pulse will be checked often. After the procedure, your blood may be tested to see how you are responding. This information is not intended to replace advice given to you by your health care provider. Make sure you discuss any questions you have with your health care provider. Document Revised: 11/22/2018 Document Reviewed: 11/22/2018 Elsevier Patient Education  [place].

## 2021-05-29 ENCOUNTER — Ambulatory Visit (HOSPITAL_COMMUNITY)
Admission: RE | Admit: 2021-05-29 | Discharge: 2021-05-29 | Disposition: A | Discharge status: Home / Self Care | Payer: Medicare HMO | Source: Ambulatory Visit | Attending: Hematology | Admitting: Hematology

## 2021-05-29 DIAGNOSIS — M40204 Unspecified kyphosis, thoracic region: Secondary | ICD-10-CM | POA: Diagnosis not present

## 2021-05-29 DIAGNOSIS — R29890 Loss of height: Secondary | ICD-10-CM | POA: Diagnosis not present

## 2021-05-29 DIAGNOSIS — C9000 Multiple myeloma not having achieved remission: Secondary | ICD-10-CM | POA: Diagnosis not present

## 2021-05-29 DIAGNOSIS — M546 Pain in thoracic spine: Secondary | ICD-10-CM | POA: Diagnosis not present

## 2021-05-29 MED ORDER — GADOBUTROL 1 MMOL/ML IV SOLN
7.0000 mL | Freq: Once | INTRAVENOUS | Status: AC | PRN
  Administered 2021-05-29: 7 mL via INTRAVENOUS

## 2021-05-31 ENCOUNTER — Other Ambulatory Visit: Payer: Self-pay | Admitting: Radiology

## 2021-05-31 ENCOUNTER — Other Ambulatory Visit: Payer: Self-pay

## 2021-05-31 ENCOUNTER — Ambulatory Visit (HOSPITAL_COMMUNITY)
Admission: RE | Admit: 2021-05-31 | Discharge: 2021-06-01 | Disposition: A | Discharge status: Home / Self Care | Payer: Medicare HMO | Source: Ambulatory Visit | Attending: Internal Medicine | Admitting: Internal Medicine

## 2021-05-31 ENCOUNTER — Telehealth (HOSPITAL_COMMUNITY): Payer: Self-pay

## 2021-05-31 ENCOUNTER — Inpatient Hospital Stay: Payer: Medicare HMO

## 2021-05-31 ENCOUNTER — Other Ambulatory Visit (HOSPITAL_COMMUNITY): Payer: Self-pay | Admitting: Neuroradiology

## 2021-05-31 DIAGNOSIS — C9000 Multiple myeloma not having achieved remission: Secondary | ICD-10-CM

## 2021-05-31 DIAGNOSIS — S22060A Wedge compression fracture of T7-T8 vertebra, initial encounter for closed fracture: Secondary | ICD-10-CM

## 2021-05-31 DIAGNOSIS — C9310 Chronic myelomonocytic leukemia not having achieved remission: Secondary | ICD-10-CM | POA: Diagnosis not present

## 2021-05-31 DIAGNOSIS — E1142 Type 2 diabetes mellitus with diabetic polyneuropathy: Secondary | ICD-10-CM | POA: Insufficient documentation

## 2021-05-31 DIAGNOSIS — D649 Anemia, unspecified: Secondary | ICD-10-CM | POA: Insufficient documentation

## 2021-05-31 DIAGNOSIS — S22080A Wedge compression fracture of T11-T12 vertebra, initial encounter for closed fracture: Secondary | ICD-10-CM

## 2021-05-31 DIAGNOSIS — Z79899 Other long term (current) drug therapy: Secondary | ICD-10-CM | POA: Insufficient documentation

## 2021-05-31 DIAGNOSIS — S22070A Wedge compression fracture of T9-T10 vertebra, initial encounter for closed fracture: Secondary | ICD-10-CM

## 2021-05-31 DIAGNOSIS — Z20822 Contact with and (suspected) exposure to covid-19: Secondary | ICD-10-CM | POA: Diagnosis not present

## 2021-05-31 DIAGNOSIS — R69 Illness, unspecified: Secondary | ICD-10-CM | POA: Diagnosis not present

## 2021-05-31 DIAGNOSIS — D696 Thrombocytopenia, unspecified: Secondary | ICD-10-CM | POA: Diagnosis not present

## 2021-05-31 DIAGNOSIS — F419 Anxiety disorder, unspecified: Secondary | ICD-10-CM | POA: Diagnosis not present

## 2021-05-31 DIAGNOSIS — M8458XA Pathological fracture in neoplastic disease, other specified site, initial encounter for fracture: Secondary | ICD-10-CM | POA: Insufficient documentation

## 2021-05-31 DIAGNOSIS — F32A Depression, unspecified: Secondary | ICD-10-CM | POA: Diagnosis not present

## 2021-05-31 DIAGNOSIS — I10 Essential (primary) hypertension: Secondary | ICD-10-CM | POA: Diagnosis not present

## 2021-05-31 DIAGNOSIS — Z452 Encounter for adjustment and management of vascular access device: Secondary | ICD-10-CM

## 2021-05-31 DIAGNOSIS — M4854XA Collapsed vertebra, not elsewhere classified, thoracic region, initial encounter for fracture: Secondary | ICD-10-CM | POA: Diagnosis not present

## 2021-05-31 HISTORY — PX: IR KYPHO EA ADDL LEVEL THORACIC OR LUMBAR: IMG5520

## 2021-05-31 HISTORY — PX: IR KYPHO THORACIC WITH BONE BIOPSY: IMG5518

## 2021-05-31 LAB — CBC WITH DIFFERENTIAL (CANCER CENTER ONLY)
Abs Immature Granulocytes: 0.29 10*3/uL — ABNORMAL HIGH (ref 0.00–0.07)
Basophils Absolute: 0.1 10*3/uL (ref 0.0–0.1)
Basophils Relative: 1 %
Eosinophils Absolute: 0 10*3/uL (ref 0.0–0.5)
Eosinophils Relative: 1 %
HCT: 31 % — ABNORMAL LOW (ref 36.0–46.0)
Hemoglobin: 9.6 g/dL — ABNORMAL LOW (ref 12.0–15.0)
Immature Granulocytes: 4 %
Lymphocytes Relative: 9 %
Lymphs Abs: 0.6 10*3/uL — ABNORMAL LOW (ref 0.7–4.0)
MCH: 25.4 pg — ABNORMAL LOW (ref 26.0–34.0)
MCHC: 31 g/dL (ref 30.0–36.0)
MCV: 82 fL (ref 80.0–100.0)
Monocytes Absolute: 0.8 10*3/uL (ref 0.1–1.0)
Monocytes Relative: 11 %
Neutro Abs: 5.3 10*3/uL (ref 1.7–7.7)
Neutrophils Relative %: 74 %
Platelet Count: 63 10*3/uL — ABNORMAL LOW (ref 150–400)
RBC: 3.78 MIL/uL — ABNORMAL LOW (ref 3.87–5.11)
RDW: 17.1 % — ABNORMAL HIGH (ref 11.5–15.5)
WBC Count: 7.1 10*3/uL (ref 4.0–10.5)
nRBC: 0 % (ref 0.0–0.2)

## 2021-05-31 LAB — CMP (CANCER CENTER ONLY)
ALT: 20 U/L (ref 0–44)
AST: 19 U/L (ref 15–41)
Albumin: 3.3 g/dL — ABNORMAL LOW (ref 3.5–5.0)
Alkaline Phosphatase: 140 U/L — ABNORMAL HIGH (ref 38–126)
Anion gap: 9 (ref 5–15)
BUN: 13 mg/dL (ref 8–23)
CO2: 25 mmol/L (ref 22–32)
Calcium: 8.4 mg/dL — ABNORMAL LOW (ref 8.9–10.3)
Chloride: 107 mmol/L (ref 98–111)
Creatinine: 0.79 mg/dL (ref 0.44–1.00)
GFR, Estimated: >60 mL/min (ref >60)
Glucose, Bld: 224 mg/dL — ABNORMAL HIGH (ref 70–99)
Potassium: 4 mmol/L (ref 3.5–5.1)
Sodium: 141 mmol/L (ref 135–145)
Total Bilirubin: 1 mg/dL (ref 0.3–1.2)
Total Protein: 6.9 g/dL (ref 6.5–8.1)

## 2021-05-31 LAB — CBC WITH DIFFERENTIAL/PLATELET
Abs Immature Granulocytes: 0.25 10*3/uL — ABNORMAL HIGH (ref 0.00–0.07)
Basophils Absolute: 0.1 10*3/uL (ref 0.0–0.1)
Basophils Relative: 1 %
Eosinophils Absolute: 0.1 10*3/uL (ref 0.0–0.5)
Eosinophils Relative: 1 %
HCT: 28.1 % — ABNORMAL LOW (ref 36.0–46.0)
Hemoglobin: 8.6 g/dL — ABNORMAL LOW (ref 12.0–15.0)
Immature Granulocytes: 4 %
Lymphocytes Relative: 11 %
Lymphs Abs: 0.7 10*3/uL (ref 0.7–4.0)
MCH: 25.9 pg — ABNORMAL LOW (ref 26.0–34.0)
MCHC: 30.6 g/dL (ref 30.0–36.0)
MCV: 84.6 fL (ref 80.0–100.0)
Monocytes Absolute: 0.7 10*3/uL (ref 0.1–1.0)
Monocytes Relative: 11 %
Neutro Abs: 4.8 10*3/uL (ref 1.7–7.7)
Neutrophils Relative %: 72 %
Platelets: 47 10*3/uL — ABNORMAL LOW (ref 150–400)
RBC: 3.32 MIL/uL — ABNORMAL LOW (ref 3.87–5.11)
RDW: 17.3 % — ABNORMAL HIGH (ref 11.5–15.5)
WBC: 6.6 10*3/uL (ref 4.0–10.5)
nRBC: 0 % (ref 0.0–0.2)

## 2021-05-31 LAB — TYPE AND SCREEN
ABO/RH(D): O POS
Antibody Screen: NEGATIVE
Unit division: 0

## 2021-05-31 LAB — HEMOGLOBIN A1C
Hgb A1c MFr Bld: 7.5 % — ABNORMAL HIGH (ref 4.8–5.6)
Mean Plasma Glucose: 168.55 mg/dL

## 2021-05-31 LAB — BPAM RBC
Blood Product Expiration Date: 202301122359
ISSUE DATE / TIME: 202212161128
Unit Type and Rh: 5100

## 2021-05-31 LAB — PROTIME-INR
INR: 1.4 — ABNORMAL HIGH (ref 0.8–1.2)
Prothrombin Time: 16.8 seconds — ABNORMAL HIGH (ref 11.4–15.2)

## 2021-05-31 LAB — GLUCOSE, CAPILLARY
Glucose-Capillary: 123 mg/dL — ABNORMAL HIGH (ref 70–99)
Glucose-Capillary: 158 mg/dL — ABNORMAL HIGH (ref 70–99)

## 2021-05-31 LAB — SAMPLE TO BLOOD BANK

## 2021-05-31 MED ORDER — CEFAZOLIN SODIUM-DEXTROSE 2-4 GM/100ML-% IV SOLN: 2.0000 g | INTRAVENOUS | Status: DC

## 2021-05-31 MED ORDER — MIDAZOLAM HCL 2 MG/2ML IJ SOLN
INTRAMUSCULAR | Status: AC
  Filled 2021-05-31: qty 2

## 2021-05-31 MED ORDER — OXYCODONE HCL 5 MG PO TABS
10.0000 mg | ORAL_TABLET | Freq: Four times a day (QID) | ORAL | Status: DC | PRN
  Administered 2021-05-31 – 2021-06-01 (×2): 10 mg via ORAL
  Filled 2021-05-31 (×3): qty 2

## 2021-05-31 MED ORDER — IOHEXOL 300 MG/ML  SOLN
100.0000 mL | Freq: Once | INTRAMUSCULAR | Status: AC | PRN
  Administered 2021-05-31: 19:00:00 1 mL

## 2021-05-31 MED ORDER — GABAPENTIN 300 MG PO CAPS
600.0000 mg | ORAL_CAPSULE | Freq: Two times a day (BID) | ORAL | Status: DC
  Administered 2021-05-31 – 2021-06-01 (×2): 600 mg via ORAL
  Filled 2021-05-31 (×2): qty 2

## 2021-05-31 MED ORDER — FENTANYL CITRATE (PF) 100 MCG/2ML IJ SOLN
INTRAMUSCULAR | Status: AC
  Filled 2021-05-31: qty 2

## 2021-05-31 MED ORDER — BUPIVACAINE HCL (PF) 0.5 % IJ SOLN
INTRAMUSCULAR | Status: AC
  Filled 2021-05-31: qty 30

## 2021-05-31 MED ORDER — LIDOCAINE HCL 1 % IJ SOLN
INTRAMUSCULAR | Status: AC
  Filled 2021-05-31: qty 20

## 2021-05-31 MED ORDER — BUPIVACAINE HCL (PF) 0.5 % IJ SOLN
INTRAMUSCULAR | Status: DC | PRN
  Administered 2021-05-31: 10 mL

## 2021-05-31 MED ORDER — OXYCODONE-ACETAMINOPHEN 5-325 MG PO TABS
ORAL_TABLET | ORAL | Status: AC
  Filled 2021-05-31: qty 1

## 2021-05-31 MED ORDER — INSULIN ASPART 100 UNIT/ML IJ SOLN
0.0000 [IU] | Freq: Three times a day (TID) | INTRAMUSCULAR | Status: DC
  Administered 2021-06-01: 13:00:00 3 [IU] via SUBCUTANEOUS
  Administered 2021-06-01: 08:00:00 2 [IU] via SUBCUTANEOUS

## 2021-05-31 MED ORDER — CEFAZOLIN SODIUM-DEXTROSE 2-4 GM/100ML-% IV SOLN
INTRAVENOUS | Status: DC | PRN
  Administered 2021-05-31: 2 g via INTRAVENOUS

## 2021-05-31 MED ORDER — LORAZEPAM 1 MG PO TABS: 1.0000 mg | ORAL_TABLET | Freq: Three times a day (TID) | ORAL | Status: DC | PRN

## 2021-05-31 MED ORDER — SENNOSIDES-DOCUSATE SODIUM 8.6-50 MG PO TABS: 2.0000 | ORAL_TABLET | Freq: Every day | ORAL | Status: DC

## 2021-05-31 MED ORDER — LIDOCAINE HCL 1 % IJ SOLN
INTRAMUSCULAR | Status: DC | PRN
  Administered 2021-05-31: 10 mL

## 2021-05-31 MED ORDER — CEFAZOLIN SODIUM-DEXTROSE 2-4 GM/100ML-% IV SOLN
2.0000 g | Freq: Once | INTRAVENOUS | Status: AC
  Filled 2021-05-31: qty 100

## 2021-05-31 MED ORDER — ACETAMINOPHEN 325 MG PO TABS
650.0000 mg | ORAL_TABLET | Freq: Four times a day (QID) | ORAL | Status: DC | PRN
  Administered 2021-06-01: 05:00:00 650 mg via ORAL
  Filled 2021-05-31: qty 2

## 2021-05-31 MED ORDER — PAROXETINE HCL 10 MG PO TABS
10.0000 mg | ORAL_TABLET | Freq: Every morning | ORAL | Status: DC
  Administered 2021-06-01: 11:00:00 10 mg via ORAL
  Filled 2021-05-31: qty 1

## 2021-05-31 MED ORDER — SODIUM CHLORIDE 0.9 % IV SOLN: INTRAVENOUS | Status: DC

## 2021-05-31 MED ORDER — SODIUM CHLORIDE 0.9% FLUSH
10.0000 mL | INTRAVENOUS | Status: AC | PRN
  Administered 2021-05-31: 08:00:00 10 mL

## 2021-05-31 MED ORDER — OXYCODONE-ACETAMINOPHEN 5-325 MG PO TABS: 1.0000 | ORAL_TABLET | Freq: Four times a day (QID) | ORAL | Status: DC | PRN

## 2021-05-31 MED ORDER — CEFAZOLIN SODIUM-DEXTROSE 2-4 GM/100ML-% IV SOLN
2.0000 g | Freq: Three times a day (TID) | INTRAVENOUS | Status: DC
Start: 2021-05-31 — End: 2021-05-31

## 2021-05-31 MED ORDER — POLYETHYLENE GLYCOL 3350 17 G PO PACK
17.0000 g | PACK | Freq: Every day | ORAL | Status: DC
  Administered 2021-06-01: 11:00:00 17 g via ORAL
  Filled 2021-05-31: qty 1

## 2021-05-31 MED ORDER — FENTANYL CITRATE (PF) 100 MCG/2ML IJ SOLN
INTRAMUSCULAR | Status: DC | PRN
  Administered 2021-05-31 (×7): 25 ug via INTRAVENOUS

## 2021-05-31 MED ORDER — MIDAZOLAM HCL 2 MG/2ML IJ SOLN
INTRAMUSCULAR | Status: DC | PRN
  Administered 2021-05-31: 1 mg via INTRAVENOUS
  Administered 2021-05-31 (×5): .5 mg via INTRAVENOUS

## 2021-05-31 MED ORDER — METOPROLOL SUCCINATE ER 25 MG PO TB24
25.0000 mg | ORAL_TABLET | Freq: Every day | ORAL | Status: DC
  Administered 2021-06-01: 11:00:00 25 mg via ORAL
  Filled 2021-05-31: qty 1

## 2021-05-31 NOTE — H&P (Addendum)
Chief Complaint: Patient was seen in consultation today for vertebral compression fractures    Supervising Physician: Pedro Earls  Patient Status: [facility] - In-pt  History of Present Illness: Kathleen Gibson is a 70 y.o. female with complex medical history including [name] with severe thrombocytopenia, multiple compression fractures, normocytic anemia, DMII with polyneuropathy, HTN, panic attacks, headaches, and panic attacks. She has been wearing a thoracic brace and has persistent pain requiring oxycodone approximately 4 times daily.  Original levels of concern were T7 and T8, but on most recent imaging these levels were healing and T10 and T12 were the levels of compression that would benefit most from kyphoplasty.  Plt were 63K this morning.  She is NPO since 9am and presents with her daughter.    Past Medical History:  Diagnosis Date   Diabetes ([place])    HLD (hyperlipidemia)    Hypertension    Pernicious anemia    Pneumonia 09/05/2019   Renal insufficiency 09/06/2019   Vitamin D deficiency     Past Surgical History:  Procedure Laterality Date   ABDOMINAL HERNIA REPAIR  2009   ABDOMINOPLASTY     AXILLARY LYMPH NODE BIOPSY Left 11/11/2020   Procedure: LEFT AXILLARY EXCISIONAL LYMPH NODE BIOPSY;  Surgeon: [name], MD;  Location: [facility];  Service: General;  Laterality: Left;   [facility]   GASTRIC BYPASS  2000   hemorroid surgery  2007   TONSILLECTOMY AND ADENOIDECTOMY  1957    Allergies: Asa [aspirin] and Nsaids  Medications: Prior to Admission medications   Medication Sig Start Date End Date Taking? Authorizing Provider  gabapentin (NEURONTIN) 300 MG capsule Take 2 capsules (600 mg total) by mouth 2 (two) times daily. 12/22/20  Yes Kuneff, Renee A, DO  LORazepam (ATIVAN) 1 MG tablet Take 1 tablet (1 mg total) by mouth every 8 (eight) hours as needed for anxiety. 05/17/21  Yes Truitt Merle, MD  metoprolol succinate (TOPROL-XL) 25 MG 24 hr tablet Take  1 tablet (25 mg total) by mouth daily. 03/26/21  Yes Kuneff, Renee A, DO  ondansetron (ZOFRAN-ODT) 4 MG disintegrating tablet Take 4 mg by mouth every 8 (eight) hours as needed. 05/20/21  Yes [provider]  Oxycodone HCl 10 MG TABS 1 tab every 4-6 hr prn for chronic pain 05/17/21  Yes Kuneff, Renee A, DO  PARoxetine (PAXIL) 10 MG tablet Take 10 mg by mouth every morning. 05/20/21  Yes [provider]  sitaGLIPtin-metformin (JANUMET) 50-1000 MG tablet Take 1 tablet by mouth at bedtime. 03/26/21  Yes Kuneff, Renee A, DO  acetaminophen (TYLENOL) 500 MG tablet Take 2 tablets (1,000 mg total) by mouth every 8 (eight) hours. 03/12/21   [name], [name], MD  ammonium lactate (LAC-HYDRIN) 12 % lotion Apply topically 2 (two) times daily. 03/23/21   [provider]  bisacodyl (DULCOLAX) 10 MG suppository Place 1 suppository (10 mg total) rectally daily as needed for moderate constipation (Please take-if no bowel movement in 1-2 days with MiraLAX/senna). 03/26/21   Kuneff, Renee A, DO  bisacodyl (DULCOLAX) 10 MG suppository Place rectally. 03/26/21   [provider]  metoprolol succinate (TOPROL-XL) 50 MG 24 hr tablet Take 50 mg by mouth daily. 04/03/21   [provider]  naloxone [facility]) nasal spray 4 mg/0.1 mL Place 1 spray into the nose once as needed (overdose). 03/12/21   [name], [name], MD  polyethylene glycol powder (GLYCOLAX/MIRALAX) 17 GM/SCOOP powder Dissolve 1 capful (17 g) in water and drink  2 (two) times daily. 03/26/21   Kuneff, Renee A, DO  polyethylene glycol powder (GLYCOLAX/MIRALAX) 17 GM/SCOOP powder Take by mouth. 03/26/21   [provider]  senna-docusate (SENOKOT-S) 8.6-50 MG tablet Take 2 tablets by mouth at bedtime. 03/26/21   [name], DO     Family History  Problem Relation Age of Onset   Cancer Mother 25       unknown type cancer    Hypertension Mother    Hypertension Father    Heart failure Father    Pneumonia  Father    Diabetes Sister    Leukemia Brother    Colon cancer Brother 49   Heart Problems Brother    Leukemia Other    Diabetes Sister    Heart Problems Sister    Cancer Brother    Heart Problems Brother     Social History   Socioeconomic History   Marital status: Divorced    Spouse name: Not on file   Number of children: 3   Years of education: 12   Highest education level: Not on file  Occupational History   Occupation: Retired  Tobacco Use   Smoking status: Never   Smokeless tobacco: Never  Vaping Use   Vaping Use: Never used  Substance and Sexual Activity   Alcohol use: No   Drug use: No   Sexual activity: Not on file  Other Topics Concern   Not on file  Social History Narrative   Divorced. Retired Pension scheme manager.   12th grade education.   Drinks caffeine.   Smoke alarm in the home. Wears her seatbelt.   Feels safe in her relationships.   Two story home   Right handed    Social Determinants of Health   Financial Resource Strain: Not on file  Food Insecurity: Not on file  Transportation Needs: Not on file  Physical Activity: Not on file  Stress: Not on file  Social Connections: Not on file     Review of Systems: A 12 point ROS discussed and pertinent positives are indicated in the HPI above.  All other systems are negative.  Review of Systems  Constitutional:  Positive for activity change.  HENT: Negative.    Eyes: Negative.   Respiratory: Negative.    Cardiovascular: Negative.   Gastrointestinal: Negative.   Endocrine: Negative.   Genitourinary: Negative.   Musculoskeletal:  Positive for back pain.  Skin: Negative.   Allergic/Immunologic: Negative.   Neurological: Negative.   Hematological: Negative.   Psychiatric/Behavioral: Negative.     Vital Signs: BP 138/90    Pulse 88    Temp 98.2 F (36.8 C) (Oral)    Resp 18    Ht 5\' 3"  (1.6 m)    Wt 147 lb (66.7 kg)    SpO2 97%    BMI 26.04 kg/m   Physical Exam HENT:     Head:  Normocephalic.     Nose: Nose normal.     Mouth/Throat:     Pharynx: Oropharynx is clear.  Eyes:     Extraocular Movements: Extraocular movements intact.  Cardiovascular:     Rate and Rhythm: Normal rate and regular rhythm.     Pulses: Normal pulses.     Heart sounds: Normal heart sounds.  Pulmonary:     Effort: Pulmonary effort is normal.     Breath sounds: Normal breath sounds.  Abdominal:     General: Bowel sounds are normal.  Skin:    General: Skin is warm and dry.  Capillary Refill: Capillary refill takes 2 to 3 seconds.  Neurological:     General: No focal deficit present.     Mental Status: She is alert.  Psychiatric:        Mood and Affect: Mood normal.        Behavior: Behavior normal.    Imaging: MR THORACIC SPINE W WO CONTRAST  Result Date: 05/31/2021 CLINICAL DATA:  Monitor musculoskeletal neoplasm. Mid back pain for 7 months EXAM: MRI THORACIC WITHOUT AND WITH CONTRAST TECHNIQUE: Multiplanar and multiecho pulse sequences of the thoracic spine were obtained without and with intravenous contrast. CONTRAST:  42mL GADAVIST GADOBUTROL 1 MMOL/ML IV SOLN COMPARISON:  03/05/2021 FINDINGS: Alignment:  Exaggerated thoracic kyphosis.  No listhesis. Vertebrae: Interval healing of the T7 and T8 compression fractures with mild height loss. There is been interval T6, T10, T12, L1, and L2 insufficiency fractures. Marrow edema is greatest at T10 and T12. Height loss is mild at each level with no significant retropulsion. No discrete underlying lesion at these levels today or previously. Cord:  No hematoma or cord edema. Paraspinal and other soft tissues: Atrophy of intrinsic back muscles. Disc levels: Facet spurring and ligamentum flavum thickening greatest at T8-9 and T9-10 with spurs narrowing the foramina from behind. No herniation or impingement. IMPRESSION: 1. T6, T10, T12, L1, and L2 compression fractures since September 2022. Based on degree of marrow edema, T10 and T12 fractures  are the most recent. 2. There has been healing of the T7 and T8 compression fractures with stable mild height loss. No retropulsion or posttraumatic impingement. 3. Diffuse marrow signal abnormality in this patient with known myeloproliferative disease. No focal, destructive lesion. Electronically Signed   By: Jorje Guild M.D.   On: 05/31/2021 04:56    Labs:  CBC: Recent Labs    05/17/21 0738 05/24/21 0851 05/27/21 0813 05/31/21 0745  WBC 6.7 5.3 4.8 7.1  HGB 8.1* 7.9* 7.7* 9.6*  HCT 26.0* 25.6* 25.1* 31.0*  PLT <5* 24* 39* 63*    COAGS: Recent Labs    10/28/20 1806  INR 1.2    BMP: Recent Labs    05/17/21 0738 05/24/21 0851 05/27/21 0813 05/31/21 0745  NA 139 140 140 141  K 4.1 4.1 3.9 4.0  CL 107 109 106 107  CO2 25 24 27 25   GLUCOSE 175* 125* 138* 224*  BUN 16 17 15 13   CALCIUM 8.2* 8.0* 8.4* 8.4*  CREATININE 0.75 0.72 0.73 0.79  GFRNONAA >60 >60 >60 >60    LIVER FUNCTION TESTS: Recent Labs    05/17/21 0738 05/24/21 0851 05/27/21 0813 05/31/21 0745  BILITOT 0.6 0.7 0.6 1.0  AST 15 17 17 19   ALT 14 17 15 20   ALKPHOS 145* 159* 137* 140*  PROT 6.5 6.5 6.5 6.9  ALBUMIN 3.0* 3.1* 3.1* 3.3*     Assessment and Plan:  Pathological Compression fractures of multiple levels, T10 and T12 being the newest, with pain that is moderate to severe and persistent despite conservative medical management.   Patient presents eager to proceed with kyphoplasty, radiofrequency ablation ablation, and biopsy, and is hopeful for reduced pain.   Awaiting PT/INR, but otherwise okay to proceed.  Planning on d/c this evening.  Risks and benefits of kyphoplasty at T10 and T12 were discussed with the patient including, but not limited to education regarding the natural healing process of compression fractures without intervention, bleeding, infection, cement migration which may cause spinal cord damage, paralysis, pulmonary embolism or even death.  This  interventional procedure  involves the use of X-rays and because of the nature of the planned procedure, it is possible that we will have prolonged use of X-ray fluoroscopy.  Potential radiation risks to you include (but are not limited to) the following: - A slightly elevated risk for cancer  several years later in life. This risk is typically less than 0.5% percent. This risk is low in comparison to the normal incidence of human cancer, which is 33% for women and 50% for men according to the [place]. - Radiation induced injury can include skin redness, resembling a rash, tissue breakdown / ulcers and hair loss (which can be temporary or permanent).   The likelihood of either of these occurring depends on the difficulty of the procedure and whether you are sensitive to radiation due to previous procedures, disease, or genetic conditions.   IF your procedure requires a prolonged use of radiation, you will be notified and given written instructions for further action.  It is your responsibility to monitor the irradiated area for the 2 weeks following the procedure and to notify your physician if you are concerned that you have suffered a radiation induced injury.    All of the patient's questions were answered, patient is agreeable to proceed.  Consent signed and in chart.   Thank you for this interesting consult.  I greatly enjoyed meeting LYNNETTA TOM and look forward to participating in their care.  A copy of this report was sent to the requesting provider on this date.  Electronically Signed: Pasty Spillers, PA 05/31/2021, 2:10 PM   I spent a total of 40 Minutes in face to face in clinical consultation, greater than 50% of which was counseling/coordinating care for kyphoplasty.

## 2021-05-31 NOTE — Telephone Encounter (Signed)
-----   Message from Pedro Earls, MD sent at 05/31/2021  9:04 AM EST ----- Regarding: levels chage Kathleen Gibson,  Kathleen Gibson had an MRI on Saturday. The levels that we were targeting, T7 and T8, have mostly healed and now there are new fracture at T10, T12 and L1 with the most affected being T10 and T12. I think we should do these levels (T10 and 12) instead of the planned T7 and T8. Do you think that uuwould be a problem with insurance?   Thanks, [employer]

## 2021-05-31 NOTE — Procedures (Signed)
INTERVENTIONAL NEURORADIOLOGY BRIEF POSTPROCEDURE NOTE  T10 and T12 fluoroscopy guided core bone biopsy, radiofrequency ablation and kyphoplasty  Attending: Dr. Pedro Earls  Assistant: None.  Diagnosis: Acute pathologic compression fractures at T10 and T12  Access site: Percutaneous.  Anesthesia: Moderate sedation.  Medication used: 3.5 mg Versed IV; 175 mcg Fentanyl IV.  Complications: None.  Estimated blood loss: Minimal.  Specimen: T10 and T12 core bone biopsies  Findings: Compression fractures confirmed. Bilateral transpedicular approach utilized for T10 and T12 core bone biopsy, radiofrequency ablation and kyphoplasty.  Patient presented persistent oozing through incisions and labile blood pressure throughout intervention. Given that short stay closing soon, that would only allow for a short post procedure observation. For this reason, the patient will be admitted for overnight observation. Patient accepted by Dr. Jonita Albee.

## 2021-05-31 NOTE — H&P (Signed)
History and Physical    Kathleen Gibson:[id number] DOB: 1951-02-06 DOA: 05/31/2021  PCP: [name], DO  Patient coming from: Home  I have personally briefly reviewed patient's old medical records in [place]  Chief Complaint: increase bleeding s/p kyphoplasty  HPI: Kathleen Gibson is a 70 y.o. female with medical history significant for chronic myelomonocytic leukemia on chemotherapy, chronic thrombocytopenia, multiple compression fractures, type 2 diabetes, hypertension, hyperlipidemia who presents s/p outpatient kyphoplasty for increased bleeding at surgical site requiring observation.  Patient presented earlier today for outpatient short-stay kyphoplasty with VIR for compression fracture at T10 and T12.  However she was noted to have some elevated blood pressure up to systolic of 630 during the operation and increase bleeding at postoperative site.  VIR Dr.De Sindy Messing requested hospitalist to admit for observation and monitoring of her platelet. Platelets is at 63K as of this morning pre-op and is the highest it has been in the past month with previous nadir of <5K.   Patient evaluated post-operatively and states she feels pain. Have minimal pain. Quikclot guaze dressing has been placed on mid-thoracic surgical wound.  Review of Systems: Constitutional: No Fever Cardiovascular: No Chest Pain, no SOB, Respiratory:  no Dyspnea  Gastrointestinal: No Nausea, No Vomiting, No Diarrhea, No Pain Genitourinary: no Urinary Incontinence, Musculoskeletal: No Arthralgias, No Myalgias Skin: No Skin Lesions, No Pruritus, Neuro: no Weakness, No Numbness Psych: no decrease appetite Heme/Lymph: No Bruising, + Bleeding  Past Medical History:  Diagnosis Date   Diabetes ([place])    HLD (hyperlipidemia)    Hypertension    Pernicious anemia    Pneumonia 09/05/2019   Renal insufficiency 09/06/2019   Vitamin D deficiency     Past Surgical History:  Procedure Laterality Date    ABDOMINAL HERNIA REPAIR  2009   ABDOMINOPLASTY     AXILLARY LYMPH NODE BIOPSY Left 11/11/2020   Procedure: LEFT AXILLARY EXCISIONAL LYMPH NODE BIOPSY;  Surgeon: [name], MD;  Location: [facility];  Service: General;  Laterality: Left;   [place]   hemorroid surgery  2007   [place]     reports that she has never smoked. She has never used smokeless tobacco. She reports that she does not drink alcohol and does not use drugs. Social History  Allergies  Allergen Reactions   Asa [Aspirin] Other (See Comments)    Contraindicated d/t low plts   Nsaids Other (See Comments)    Contraindicated d/t low plts    Family History  Problem Relation Age of Onset   Cancer Mother 66       unknown type cancer    Hypertension Mother    Hypertension Father    Heart failure Father    Pneumonia Father    Diabetes Sister    Leukemia Brother    Colon cancer Brother 73   Heart Problems Brother    Leukemia Other    Diabetes Sister    Heart Problems Sister    Cancer Brother    Heart Problems Brother      Prior to Admission medications   Medication Sig Start Date End Date Taking? Authorizing Provider  gabapentin (NEURONTIN) 300 MG capsule Take 2 capsules (600 mg total) by mouth 2 (two) times daily. 12/22/20  Yes Kuneff, Renee A, DO  LORazepam (ATIVAN) 1 MG tablet Take 1 tablet (1 mg total) by mouth every 8 (eight) hours as needed for anxiety. 05/17/21  Yes Truitt Merle, MD  metoprolol succinate (TOPROL-XL) 25 MG 24 hr tablet Take 1 tablet (25 mg total) by mouth daily. 03/26/21  Yes Kuneff, Renee A, DO  ondansetron (ZOFRAN-ODT) 4 MG disintegrating tablet Take 4 mg by mouth every 8 (eight) hours as needed. 05/20/21  Yes [provider]  Oxycodone HCl 10 MG TABS 1 tab every 4-6 hr prn for chronic pain 05/17/21  Yes Kuneff, Renee A, DO  PARoxetine (PAXIL) 10 MG tablet Take 10 mg by mouth every morning. 05/20/21  Yes [provider]  sitaGLIPtin-metformin (JANUMET) 50-1000 MG tablet Take 1 tablet by mouth at bedtime. 03/26/21  Yes Kuneff, Renee A, DO  acetaminophen (TYLENOL) 500 MG tablet Take 2 tablets (1,000 mg total) by mouth every 8 (eight) hours. 03/12/21   [name], [name], MD  ammonium lactate (LAC-HYDRIN) 12 % lotion Apply topically 2 (two) times daily. 03/23/21   [provider]  bisacodyl (DULCOLAX) 10 MG suppository Place 1 suppository (10 mg total) rectally daily as needed for moderate constipation (Please take-if no bowel movement in 1-2 days with MiraLAX/senna). 03/26/21   Kuneff, Renee A, DO  bisacodyl (DULCOLAX) 10 MG suppository Place rectally. 03/26/21   [provider]  metoprolol succinate (TOPROL-XL) 50 MG 24 hr tablet Take 50 mg by mouth daily. 04/03/21   [provider]  naloxone [facility]) nasal spray 4 mg/0.1 mL Place 1 spray into the nose once as needed (overdose). 03/12/21   [name], [name], MD  polyethylene glycol powder (GLYCOLAX/MIRALAX) 17 GM/SCOOP powder Dissolve 1 capful (17 g) in water and drink 2 (two) times daily. 03/26/21   Kuneff, Renee A, DO  polyethylene glycol powder (GLYCOLAX/MIRALAX) 17 GM/SCOOP powder Take by mouth. 03/26/21   [provider]  senna-docusate (SENOKOT-S) 8.6-50 MG tablet Take 2 tablets by mouth at bedtime. 03/26/21   [name], DO    Physical Exam: Vitals:   05/31/21 1850 05/31/21 1855 05/31/21 1900 05/31/21 1905  BP: (!) 146/107 (!) 149/86 (!) 151/77 (!) 149/78  Pulse: (!) 105 (!) 104 (!) 101 (!) 105  Resp: 15 14 16 18   Temp:      TempSrc:      SpO2: 100% 100% 99% 99%  Weight:      Height:        Constitutional: NAD, calm, comfortable, elderly female laying flat in bed Vitals:   05/31/21 1850 05/31/21 1855 05/31/21 1900 05/31/21 1905  BP: (!) 146/107 (!) 149/86 (!) 151/77 (!) 149/78  Pulse: (!) 105 (!) 104 (!) 101 (!) 105  Resp: 15 14 16 18   Temp:      TempSrc:      SpO2: 100% 100% 99% 99%  Weight:       Height:       Eyes: Lids and conjunctivae normal ENMT: Mucous membranes are moist.  Neck: normal, supple Respiratory: clear to auscultation bilaterally, no wheezing, no crackles. Normal respiratory effort.  Cardiovascular: Regular rate and rhythm, no murmurs / rubs / gallops. No extremity edema.  Abdomen: no tenderness, Bowel sounds positive.  Back: two overlapping surgical gauze to mid-thoracic back with some bleeding noted to top gauze Musculoskeletal: no clubbing / cyanosis. No joint deformity upper and lower extremities. Normal muscle tone.  Skin: no rashes, lesions, ulcers. No induration Neurologic: CN 2-12 grossly intact. Sensation intact, Strength 5/5 in all 4.  Psychiatric: Normal judgment and insight. Alert and oriented x 3. Normal mood.    Labs on Admission: I have personally reviewed following labs and  imaging studies  CBC: Recent Labs  Lab 05/27/21 0813 05/31/21 0745  WBC 4.8 7.1  NEUTROABS 3.1 5.3  HGB 7.7* 9.6*  HCT 25.1* 31.0*  MCV 83.1 82.0  PLT 39* 63*   Basic Metabolic Panel: Recent Labs  Lab 05/27/21 0813 05/31/21 0745  NA 140 141  K 3.9 4.0  CL 106 107  CO2 27 25  GLUCOSE 138* 224*  BUN 15 13  CREATININE 0.73 0.79  CALCIUM 8.4* 8.4*   GFR: Estimated Creatinine Clearance: 60.9 mL/min (by C-G formula based on SCr of 0.79 mg/dL). Liver Function Tests: Recent Labs  Lab 05/27/21 0813 05/31/21 0745  AST 17 19  ALT 15 20  ALKPHOS 137* 140*  BILITOT 0.6 1.0  PROT 6.5 6.9  ALBUMIN 3.1* 3.3*   No results for input(s): LIPASE, AMYLASE in the last 168 hours. No results for input(s): AMMONIA in the last 168 hours. Coagulation Profile: No results for input(s): INR, PROTIME in the last 168 hours. Cardiac Enzymes: No results for input(s): CKTOTAL, CKMB, CKMBINDEX, TROPONINI in the last 168 hours. BNP (last 3 results) No results for input(s): PROBNP in the last 8760 hours. HbA1C: No results for input(s): HGBA1C in the last 72 hours. CBG: Recent  Labs  Lab 05/31/21 1315  GLUCAP 123*   Lipid Profile: No results for input(s): CHOL, HDL, LDLCALC, TRIG, CHOLHDL, LDLDIRECT in the last 72 hours. Thyroid Function Tests: No results for input(s): TSH, T4TOTAL, FREET4, T3FREE, THYROIDAB in the last 72 hours. Anemia Panel: No results for input(s): VITAMINB12, FOLATE, FERRITIN, TIBC, IRON, RETICCTPCT in the last 72 hours. Urine analysis:    Component Value Date/Time   COLORURINE AMBER (A) 03/08/2021 2029   APPEARANCEUR CLEAR 03/08/2021 2029   LABSPEC 1.024 03/08/2021 2029   PHURINE 6.0 03/08/2021 2029   GLUCOSEU 150 (A) 03/08/2021 2029   HGBUR NEGATIVE 03/08/2021 2029   BILIRUBINUR NEGATIVE 03/08/2021 2029   KETONESUR NEGATIVE 03/08/2021 2029   PROTEINUR 30 (A) 03/08/2021 2029   NITRITE NEGATIVE 03/08/2021 2029   LEUKOCYTESUR NEGATIVE 03/08/2021 2029    Radiological Exams on Admission: No results found.    Assessment/Plan  [name] with severe thrombocytopenia s/p kyphoplasty -Currently on chemotherapy with oncology Dr. Burr Medico -Has received platelet transfusion as needed outpatient (Plt <15K) -She is having increase bleeding at post-surgical site in mid thoracic back following surgery. Plt pre-op at 63K which is the highest it has been in a month -repeat CBC tomorrow to follow platelet count.  Low threshold to repeat sooner if bleeding becomes more significant overnight  Compression fracture of T10 and T12 s/p kyphoplasty -underwent kyphoplasty today with Dr.De Sindy Messing -PRN home oxycodone for pain  -QuickClot guaze can stay on for 24 hours unless bleeding is significant.   HTN Had a transient hypertension and tachycardia while intraoperative.  This has now resolved. Continue metoprolol  Type 2 diabetes with peripheral neuropathy place on low dose SSI  continue gabapentin   Depression/Anxiety continue Paxil and PRN Ativan  DVT prophylaxis: SCD code Status: Full Family Communication: Plan discussed with patient  at bedside  disposition Plan: Home with observation Consults called:  Admission status: Observation  Level of care:  Observation     Nolita Kutter T Draycen Leichter DO [facility]   If 7PM-7AM, please contact night-coverage [email or web address]   05/31/2021, 7:21 PM

## 2021-05-31 NOTE — Progress Notes (Signed)
Per the parameters patient does not need platelet transfusion today. Copy of labs provided to patient. Clarification of arrival time for IR appointment provided to patient with 1:00 arrival confirmed. Patient verbalized understanding and had no other questions or concerns.

## 2021-06-01 ENCOUNTER — Other Ambulatory Visit (HOSPITAL_COMMUNITY): Payer: Medicaid Other

## 2021-06-01 DIAGNOSIS — M8458XA Pathological fracture in neoplastic disease, other specified site, initial encounter for fracture: Secondary | ICD-10-CM | POA: Diagnosis not present

## 2021-06-01 DIAGNOSIS — D649 Anemia, unspecified: Secondary | ICD-10-CM | POA: Diagnosis not present

## 2021-06-01 DIAGNOSIS — R69 Illness, unspecified: Secondary | ICD-10-CM | POA: Diagnosis not present

## 2021-06-01 DIAGNOSIS — E1142 Type 2 diabetes mellitus with diabetic polyneuropathy: Secondary | ICD-10-CM | POA: Diagnosis not present

## 2021-06-01 DIAGNOSIS — Z20822 Contact with and (suspected) exposure to covid-19: Secondary | ICD-10-CM | POA: Diagnosis not present

## 2021-06-01 DIAGNOSIS — I10 Essential (primary) hypertension: Secondary | ICD-10-CM | POA: Diagnosis not present

## 2021-06-01 DIAGNOSIS — D696 Thrombocytopenia, unspecified: Secondary | ICD-10-CM | POA: Diagnosis not present

## 2021-06-01 DIAGNOSIS — C9310 Chronic myelomonocytic leukemia not having achieved remission: Secondary | ICD-10-CM | POA: Diagnosis not present

## 2021-06-01 DIAGNOSIS — Z79899 Other long term (current) drug therapy: Secondary | ICD-10-CM | POA: Diagnosis not present

## 2021-06-01 LAB — CBC WITH DIFFERENTIAL/PLATELET
Abs Immature Granulocytes: 0.18 10*3/uL — ABNORMAL HIGH (ref 0.00–0.07)
Basophils Absolute: 0.1 10*3/uL (ref 0.0–0.1)
Basophils Relative: 1 %
Eosinophils Absolute: 0.1 10*3/uL (ref 0.0–0.5)
Eosinophils Relative: 1 %
HCT: 27.3 % — ABNORMAL LOW (ref 36.0–46.0)
Hemoglobin: 8.6 g/dL — ABNORMAL LOW (ref 12.0–15.0)
Immature Granulocytes: 3 %
Lymphocytes Relative: 11 %
Lymphs Abs: 0.7 10*3/uL (ref 0.7–4.0)
MCH: 26.5 pg (ref 26.0–34.0)
MCHC: 31.5 g/dL (ref 30.0–36.0)
MCV: 84.3 fL (ref 80.0–100.0)
Monocytes Absolute: 0.6 10*3/uL (ref 0.1–1.0)
Monocytes Relative: 10 %
Neutro Abs: 4.3 10*3/uL (ref 1.7–7.7)
Neutrophils Relative %: 74 %
Platelets: 49 10*3/uL — ABNORMAL LOW (ref 150–400)
RBC: 3.24 MIL/uL — ABNORMAL LOW (ref 3.87–5.11)
RDW: 17.2 % — ABNORMAL HIGH (ref 11.5–15.5)
WBC: 5.8 10*3/uL (ref 4.0–10.5)
nRBC: 0 % (ref 0.0–0.2)

## 2021-06-01 LAB — GLUCOSE, CAPILLARY
Glucose-Capillary: 179 mg/dL — ABNORMAL HIGH (ref 70–99)
Glucose-Capillary: 215 mg/dL — ABNORMAL HIGH (ref 70–99)

## 2021-06-01 LAB — RESP PANEL BY RT-PCR (FLU A&B, COVID) ARPGX2
Influenza A by PCR: NEGATIVE
Influenza B by PCR: NEGATIVE
SARS Coronavirus 2 by RT PCR: NEGATIVE

## 2021-06-01 LAB — BASIC METABOLIC PANEL
Anion gap: 4 — ABNORMAL LOW (ref 5–15)
BUN: 9 mg/dL (ref 8–23)
CO2: 27 mmol/L (ref 22–32)
Calcium: 7.9 mg/dL — ABNORMAL LOW (ref 8.9–10.3)
Chloride: 107 mmol/L (ref 98–111)
Creatinine, Ser: 0.73 mg/dL (ref 0.44–1.00)
GFR, Estimated: >60 mL/min (ref >60)
Glucose, Bld: 193 mg/dL — ABNORMAL HIGH (ref 70–99)
Potassium: 3.8 mmol/L (ref 3.5–5.1)
Sodium: 138 mmol/L (ref 135–145)

## 2021-06-01 NOTE — Plan of Care (Signed)
  Problem: Education: Goal: Knowledge of General Education information will improve Description: Including pain rating scale, medication(s)/side effects and non-pharmacologic comfort measures Outcome: Adequate for Discharge   

## 2021-06-01 NOTE — Discharge Summary (Signed)
Physician Discharge Summary  Kathleen Gibson ZOX:[id number] DOB: Mar 31, 1951 DOA: 05/31/2021  PCP: [name], DO  Admit date: 05/31/2021 Discharge date: 06/01/2021  Admitted From: Home Discharge disposition: Home   Code Status: Full Code   Discharge Diagnosis:   Principal Problem:   Pathological fracture of vertebra due to neoplastic disease, initial encounter Active Problems:   Type 2 diabetes mellitus with diabetic polyneuropathy, without long-term current use of insulin (HCC)   Essential hypertension   Thrombocytopenia (HCC)   [name] (chronic myelomonocytic leukemia) ([place])    Chief complaint: Postprocedure bleeding.  Brief narrative: Kathleen Gibson is a 70 y.o. female with PMH significant for chronic myelomonocytic leukemia on chemotherapy, chronic thrombocytopenia, multiple compression fractures, type 2 diabetes, hypertension, hyperlipidemia On 12/19, patient presented as an outpatient to IR for kyphoplasty for acute pathological compression fracture of T10 and T12.  She underwent fluoroscopy guided bone biopsy, radiofrequency ablation and kyphoplasty.  Per radiology note, patient had persistent oozing through incisions and had blood pressure elevated to 180s throughout the intervention.   Patient was kept in observation overnight for further monitoring.   Who presents s/p outpatient kyphoplasty for increased bleeding at surgical site requiring observation. Preop labs showed platelets at 63,000   Subjective: Patient was seen and examined this morning.  Pleasant elderly Caucasian female.  Lying on bed.  Not in distress.  Thyroplasty site examined.  No soakage noted. Chart reviewed Afebrile overnight, blood pressure normalized, breathing on room air Labs postprocedure last night showed WBC count of 6.6, hemoglobin 8.6, platelet 47.  Assessment/Plan: [name] with severe thrombocytopenia -Currently on chemotherapy with oncology Dr. [facility] platelet transfusion as needed  outpatient (Plt <15K) -Preop platelet count was up at 63,000 -Repeat CBC postprocedure showed platelet at 47,000.  No need of transfusion inpatient. -Bleeding has stopped. -Okay to discharge home today to follow-up with oncology as an outpatient Recent Labs  Lab 05/27/21 0813 05/31/21 0745 05/31/21 2230 06/01/21 0750  WBC 4.8 7.1 6.6 5.8  NEUTROABS 3.1 5.3 4.8 4.3  HGB 7.7* 9.6* 8.6* 8.6*  HCT 25.1* 31.0* 28.1* 27.3*  MCV 83.1 82.0 84.6 84.3  PLT 39* 63* 47* 49*   Chronic anemia due to CML -On vitamin B12 supplement.  I do not see iron supplementation. -Hemoglobin at baseline between 8 and 9.  Continue to follow-up with oncology as an outpatient. Recent Labs    10/28/20 0952 10/28/20 1806 10/29/20 0734 10/30/20 0545 05/24/21 0851 05/27/21 0813 05/31/21 0745 05/31/21 2230 06/01/21 0750  HGB 8.9 Repeated and verified X2.*   < > 8.1*   < > 7.9* 7.7* 9.6* 8.6* 8.6*  MCV 86.8   < > 92.5   < > 83.9 83.1 82.0 84.6 84.3  VITAMINB12  --   --  3,903*  --   --   --   --   --   --   FOLATE  --   --  10.3  --   --   --   --   --   --   FERRITIN 29  --  27  --   --   --   --   --   --   TIBC 305  --  311  --   --   --   --   --   --   IRON 52  --  47  --   --   --   --   --   --   RETICCTPCT  --   --  3.5*  --   --   --   --   --   --    < > = values in this interval not displayed.   Compression fracture of T10 and T12  s/p kyphoplasty -underwent kyphoplasty today with Dr.De Sindy Messing -PRN home oxycodone for pain  -Per radiology, QuickClot guaze can stay on for 24 hours unless bleeding is significant.    Essential hypertension -On metoprolol succinate 50 mg daily.  She had transient hypertension and tachycardia during the procedure.  Both improved.  Continue metoprolol.  Type 2 diabetes mellitus Peripheral neuropathy -A1c 7.5 in 12/19 -Home meds include Janumet 50/1000 mg daily and Neurontin -Continue the same.   Depression/Anxiety -continue Paxil and PRN  Ativan  Mobility: Uses walker Living condition: Lives at home with family Goals of care:   Code Status: Full Code  Nutritional status: Body mass index is 26.04 kg/m.      Discharge Medications:   Allergies as of 06/01/2021       Reactions   Asa [aspirin] Other (See Comments)   Contraindicated d/t low plts   Nsaids Other (See Comments)   Contraindicated d/t low plts        Medication List     TAKE these medications    acetaminophen 500 MG tablet Commonly known as: TYLENOL Take 2 tablets (1,000 mg total) by mouth every 8 (eight) hours.   ammonium lactate 12 % lotion Commonly known as: LAC-HYDRIN Apply 1 application topically 2 (two) times daily as needed for dry skin.   bisacodyl 10 MG suppository Commonly known as: DULCOLAX Place 1 suppository (10 mg total) rectally daily as needed for moderate constipation (Please take-if no bowel movement in 1-2 days with MiraLAX/senna).   cyanocobalamin 1000 MCG/ML injection Commonly known as: (VITAMIN B-12) Inject 1,000 mcg into the muscle every 30 (thirty) days.   gabapentin 300 MG capsule Commonly known as: NEURONTIN Take 2 capsules (600 mg total) by mouth 2 (two) times daily.   LORazepam 1 MG tablet Commonly known as: ATIVAN Take 1 tablet (1 mg total) by mouth every 8 (eight) hours as needed for anxiety.   metoprolol succinate 25 MG 24 hr tablet Commonly known as: TOPROL-XL Take 1 tablet (25 mg total) by mouth daily. What changed: Another medication with the same name was removed. Continue taking this medication, and follow the directions you see here.   Narcan 4 MG/0.1ML Liqd nasal spray kit Generic drug: naloxone Place 1 spray into the nose once as needed (overdose).   ondansetron 4 MG disintegrating tablet Commonly known as: ZOFRAN-ODT Take 4 mg by mouth every 8 (eight) hours as needed for nausea or vomiting.   Oxycodone HCl 10 MG Tabs 1 tab every 4-6 hr prn for chronic pain What changed:  how much to  take how to take this when to take this reasons to take this   PARoxetine 10 MG tablet Commonly known as: PAXIL Take 10 mg by mouth every morning.   polyethylene glycol powder 17 GM/SCOOP powder Commonly known as: GLYCOLAX/MIRALAX Dissolve 1 capful (17 g) in water and drink 2 (two) times daily. What changed: when to take this   senna-docusate 8.6-50 MG tablet Commonly known as: Senokot-S Take 2 tablets by mouth at bedtime. What changed:  when to take this reasons to take this   sitaGLIPtin-metformin 50-1000 MG tablet Commonly known as: JANUMET Take 1 tablet by mouth at bedtime.  Discharge Care Instructions  (From admission, onward)           Start     Ordered   06/01/21 0000  Discharge wound care:        06/01/21 1144            Wound care:   Incision (Closed) 10/30/20 Sacrum Right (Active)  Date First Assessed/Time First Assessed: 10/30/20 1000   Location: Sacrum  Location Orientation: Right  Present on Admission: No    Assessments 10/30/2020 10:39 AM 11/19/2020  9:30 PM  Dressing Type Adhesive bandage None  Dressing Clean;Dry;Intact --  Site / Wound Assessment Clean;Dry --  Closure None --  Drainage Amount None --     No Linked orders to display     Incision (Closed) 10/30/20 Axilla Right (Active)  Date First Assessed/Time First Assessed: 10/30/20 1015   Location: Axilla  Location Orientation: Right  Present on Admission: No    Assessments 10/30/2020 10:40 AM 11/19/2020  9:30 PM  Dressing Type Adhesive bandage Liquid skin adhesive  Dressing Clean;Dry;Intact --  Site / Wound Assessment Clean;Dry Clean;Dry  Closure None Skin glue  Drainage Amount None None     No Linked orders to display     Incision (Closed) 11/11/20 Axilla Left (Active)  Date First Assessed/Time First Assessed: 11/11/20 1114   Location: Axilla  Location Orientation: Left    Assessments 11/11/2020 11:30 AM 11/19/2020  9:30 PM  Dressing Type Liquid skin adhesive  Liquid skin adhesive  Dressing Clean;Dry;Intact --  Site / Wound Assessment Clean;Dry Clean;Dry  Margins Attached edges (approximated) --  Closure Skin glue Skin glue  Drainage Amount None None     No Linked orders to display     Wound / Incision (Open or Dehisced) 05/31/21 Puncture Vertebral column Right;Left;Mid T10 kypho/bx sites right and left. 2 punctures (Active)  Date First Assessed/Time First Assessed: 05/31/21 1910   Wound Type: Puncture  Location: Vertebral column  Location Orientation: Right;Left;Mid  Wound Description (Comments): T10 kypho/bx sites right and left. 2 punctures    Assessments 05/31/2021  7:10 PM 05/31/2021 10:26 PM  Dressing Type Other (Comment);Gauze (Comment);Transparent dressing Gauze (Comment);Transparent dressing  Dressing Changed New New  Dressing Status Clean;Dry;Intact Clean;Dry;Intact  Dressing Change Frequency PRN PRN  Site / Wound Assessment Clean;Dry;Pink Clean;Dry;Pink  Closure None --  Drainage Amount None --  Drainage Description -- Serosanguineous  Treatment Cleansed Ice applied     No Linked orders to display     Wound / Incision (Open or Dehisced) 05/31/21 Puncture Vertebral column Right;Left;Mid T12 kypho/bx sites x2 (Active)  Date First Assessed/Time First Assessed: 05/31/21 1910   Wound Type: Puncture  Location: Vertebral column  Location Orientation: Right;Left;Mid  Wound Description (Comments): T12 kypho/bx sites x2  Present on Admission: No    Assessments 05/31/2021  7:10 PM 05/31/2021 10:26 PM  Dressing Type Other (Comment);Gauze (Comment);Transparent dressing --  Dressing Changed New --  Dressing Status Clean;Dry;Intact Clean;Dry;Intact  Closure None --  Drainage Amount None --  Treatment Cleansed Ice applied     No Linked orders to display    Discharge Instructions:   Discharge Instructions     Call MD for:  difficulty breathing, headache or visual disturbances   Complete by: As directed    Call MD for:  extreme  fatigue   Complete by: As directed    Call MD for:  hives   Complete by: As directed    Call MD for:  persistant dizziness or light-headedness   Complete  by: As directed    Call MD for:  persistant nausea and vomiting   Complete by: As directed    Call MD for:  severe uncontrolled pain   Complete by: As directed    Call MD for:  temperature >100.4   Complete by: As directed    Diet general   Complete by: As directed    Discharge instructions   Complete by: As directed    General discharge instructions:  Follow with Primary MD Raoul Pitch, Renee A, DO in 7 days   Get CBC/BMP checked in next visit within 1 week by PCP or SNF MD. (We routinely change or add medications that can affect your baseline labs and fluid status, therefore we recommend that you get the mentioned basic workup next visit with your PCP, your PCP may decide not to get them or add new tests based on their clinical decision)  On your next visit with your PCP, please get your medicines reviewed and adjusted.  Please request your PCP  to go over all hospital tests, procedures, radiology results at the follow up, please get all Hospital records sent to your PCP by signing hospital release before you go home.  Activity: As tolerated with Full fall precautions use walker/cane & assistance as needed  Avoid using any recreational substances like cigarette, tobacco, alcohol, or non-prescribed drug.  If you experience worsening of your admission symptoms, develop shortness of breath, life threatening emergency, suicidal or homicidal thoughts you must seek medical attention immediately by calling 911 or calling your MD immediately  if symptoms less severe.  You must read complete instructions/literature along with all the possible adverse reactions/side effects for all the medicines you take and that have been prescribed to you. Take any new medicine only after you have completely understood and accepted all the possible adverse  reactions/side effects.   Do not drive, operate heavy machinery, perform activities at heights, swimming or participation in water activities or provide baby sitting services if your were admitted for syncope or siezures until you have seen by Primary MD or a Neurologist and advised to do so again.  Do not drive when taking Pain medications.  Do not take more than prescribed Pain, Sleep and Anxiety Medications  Wear Seat belts while driving.  Please note You were cared for by a hospitalist during your hospital stay. If you have any questions about your discharge medications or the care you received while you were in the hospital after you are discharged, you can call the unit and asked to speak with the hospitalist on call if the hospitalist that took care of you is not available. Once you are discharged, your primary care physician will handle any further medical issues. Please note that NO REFILLS for any discharge medications will be authorized once you are discharged, as it is imperative that you return to your primary care physician (or establish a relationship with a primary care physician if you do not have one) for your aftercare needs so that they can reassess your need for medications and monitor your lab values.   Discharge wound care:   Complete by: As directed    Increase activity slowly   Complete by: As directed        Follow ups:    Follow-up Information     Kuneff, Renee A, DO Follow up.   Specialty: Family Medicine Contact information: [id number] [place] [address] [phone]  Discharge Exam:   Vitals:   05/31/21 2148 05/31/21 2332 06/01/21 0424 06/01/21 0723  BP: 140/70 121/71 131/76 118/64  Pulse: 95 93 86 77  Resp: $Remo'20 16 18 18  'jwonT$ Temp: 98.1 F (36.7 C) 97.7 F (36.5 C) 97.7 F (36.5 C) 98.1 F (36.7 C)  TempSrc: Oral Oral Oral Oral  SpO2: 99% 98% 99% 97%  Weight:      Height:        Body mass index is 26.04 kg/m.   General exam: Pleasant elderly Caucasian female.  Not in distress Skin: No rashes, lesions or ulcers. HEENT: Atraumatic, normocephalic, no obvious bleeding Lungs: Clear to auscultation bilaterally CVS: Regular rate and rhythm, no murmur GI/Abd soft, nontender, nondistended, bowel sound present CNS: Alert, awake, oriented x3 Psychiatry: Mood appropriate Extremities: No pedal edema, no calf tenderness Back: Kyphoplasty site with reinforced bandaged, no soakage noted.  Time coordinating discharge: 35 minutes   The results of significant diagnostics from this hospitalization (including imaging, microbiology, ancillary and laboratory) are listed below for reference.    Procedures and Diagnostic Studies:   No results found.   Labs:   Basic Metabolic Panel: Recent Labs  Lab 05/27/21 0813 05/31/21 0745 06/01/21 0749  NA 140 141 138  K 3.9 4.0 3.8  CL 106 107 107  CO2 $Re'27 25 27  'SzR$ GLUCOSE 138* 224* 193*  BUN $Re'15 13 9  'fFQ$ CREATININE 0.73 0.79 0.73  CALCIUM 8.4* 8.4* 7.9*   GFR Estimated Creatinine Clearance: 60.9 mL/min (by C-G formula based on SCr of 0.73 mg/dL). Liver Function Tests: Recent Labs  Lab 05/27/21 0813 05/31/21 0745  AST 17 19  ALT 15 20  ALKPHOS 137* 140*  BILITOT 0.6 1.0  PROT 6.5 6.9  ALBUMIN 3.1* 3.3*   No results for input(s): LIPASE, AMYLASE in the last 168 hours. No results for input(s): AMMONIA in the last 168 hours. Coagulation profile Recent Labs  Lab 05/31/21 2132  INR 1.4*    CBC: Recent Labs  Lab 05/27/21 0813 05/31/21 0745 05/31/21 2230 06/01/21 0750  WBC 4.8 7.1 6.6 5.8  NEUTROABS 3.1 5.3 4.8 4.3  HGB 7.7* 9.6* 8.6* 8.6*  HCT 25.1* 31.0* 28.1* 27.3*  MCV 83.1 82.0 84.6 84.3  PLT 39* 63* 47* 49*   Cardiac Enzymes: No results for input(s): CKTOTAL, CKMB, CKMBINDEX, TROPONINI in the last 168 hours. BNP: Invalid input(s): POCBNP CBG: Recent Labs  Lab 05/31/21 1315 05/31/21 2155 06/01/21 0619  GLUCAP 123* 158* 179*    D-Dimer No results for input(s): DDIMER in the last 72 hours. Hgb A1c Recent Labs    05/31/21 2030  HGBA1C 7.5*   Lipid Profile No results for input(s): CHOL, HDL, LDLCALC, TRIG, CHOLHDL, LDLDIRECT in the last 72 hours. Thyroid function studies No results for input(s): TSH, T4TOTAL, T3FREE, THYROIDAB in the last 72 hours.  Invalid input(s): FREET3 Anemia work up No results for input(s): VITAMINB12, FOLATE, FERRITIN, TIBC, IRON, RETICCTPCT in the last 72 hours. Microbiology Recent Results (from the past 240 hour(s))  Resp Panel by RT-PCR (Flu A&B, Covid) Nasopharyngeal Swab     Status: None   Collection Time: 06/01/21 12:54 AM   Specimen: Nasopharyngeal Swab; Nasopharyngeal(NP) swabs in vial transport medium  Result Value Ref Range Status   SARS Coronavirus 2 by RT PCR NEGATIVE NEGATIVE Final    Comment: (NOTE) SARS-CoV-2 target nucleic acids are NOT DETECTED.  The SARS-CoV-2 RNA is generally detectable in upper respiratory specimens during the acute phase of infection. The lowest concentration of SARS-CoV-2 viral  copies this assay can detect is 138 copies/mL. A negative result does not preclude SARS-Cov-2 infection and should not be used as the sole basis for treatment or other patient management decisions. A negative result may occur with  improper specimen collection/handling, submission of specimen other than nasopharyngeal swab, presence of viral mutation(s) within the areas targeted by this assay, and inadequate number of viral copies(<138 copies/mL). A negative result must be combined with clinical observations, patient history, and epidemiological information. The expected result is Negative.  Fact Sheet for Patients:  EntrepreneurPulse.com.au  Fact Sheet for Healthcare Providers:  IncredibleEmployment.be  This test is no t yet approved or cleared by the [place] FDA and  has been authorized for detection and/or  diagnosis of SARS-CoV-2 by FDA under an Emergency Use Authorization (EUA). This EUA will remain  in effect (meaning this test can be used) for the duration of the COVID-19 declaration under Section 564(b)(1) of the Act, 21 U.S.C.section 360bbb-3(b)(1), unless the authorization is terminated  or revoked sooner.       Influenza A by PCR NEGATIVE NEGATIVE Final   Influenza B by PCR NEGATIVE NEGATIVE Final    Comment: (NOTE) The Xpert Xpress SARS-CoV-2/FLU/RSV plus assay is intended as an aid in the diagnosis of influenza from Nasopharyngeal swab specimens and should not be used as a sole basis for treatment. Nasal washings and aspirates are unacceptable for Xpert Xpress SARS-CoV-2/FLU/RSV testing.  Fact Sheet for Patients: EntrepreneurPulse.com.au  Fact Sheet for Healthcare Providers: IncredibleEmployment.be  This test is not yet approved or cleared by the [place] FDA and has been authorized for detection and/or diagnosis of SARS-CoV-2 by FDA under an Emergency Use Authorization (EUA). This EUA will remain in effect (meaning this test can be used) for the duration of the COVID-19 declaration under Section 564(b)(1) of the Act, 21 U.S.C. section 360bbb-3(b)(1), unless the authorization is terminated or revoked.  Performed at [facility], [place] [address], [place], [place] [address]      Signed: Marlowe Aschoff Chiara Coltrin  [facility] 06/01/2021, 11:44 AM

## 2021-06-01 NOTE — Progress Notes (Signed)
Discharge instructions (including medications) discussed with and copy provided to patient/caregiver 

## 2021-06-01 NOTE — Progress Notes (Signed)
Referring Physician(s): de Eaton  Supervising Physician: Pedro Earls  Patient Status:  [facility] - In-pt  Chief Complaint:  1 day s/p kyphoplasty with increased bleeding  Subjective:  Kathleen Gibson says she has reduced pain today.  She is sitting up in recliner.    Allergies: Asa [aspirin] and Nsaids  Medications: Prior to Admission medications   Medication Sig Start Date End Date Taking? Authorizing Provider  acetaminophen (TYLENOL) 500 MG tablet Take 2 tablets (1,000 mg total) by mouth every 8 (eight) hours. 03/12/21  Yes [name], [name], MD  ammonium lactate (LAC-HYDRIN) 12 % lotion Apply 1 application topically 2 (two) times daily as needed for dry skin. 03/23/21  Yes [provider]  bisacodyl (DULCOLAX) 10 MG suppository Place 1 suppository (10 mg total) rectally daily as needed for moderate constipation (Please take-if no bowel movement in 1-2 days with MiraLAX/senna). 03/26/21  Yes Kuneff, Renee A, DO  cyanocobalamin (,VITAMIN B-12,) 1000 MCG/ML injection Inject 1,000 mcg into the muscle every 30 (thirty) days.   Yes [provider]  gabapentin (NEURONTIN) 300 MG capsule Take 2 capsules (600 mg total) by mouth 2 (two) times daily. 12/22/20  Yes Kuneff, Renee A, DO  LORazepam (ATIVAN) 1 MG tablet Take 1 tablet (1 mg total) by mouth every 8 (eight) hours as needed for anxiety. 05/17/21  Yes Truitt Merle, MD  metoprolol succinate (TOPROL-XL) 25 MG 24 hr tablet Take 1 tablet (25 mg total) by mouth daily. 03/26/21  Yes Kuneff, Renee A, DO  naloxone (NARCAN) nasal spray 4 mg/0.1 mL Place 1 spray into the nose once as needed (overdose). 03/12/21  Yes [name], [name], MD  ondansetron (ZOFRAN-ODT) 4 MG disintegrating tablet Take 4 mg by mouth every 8 (eight) hours as needed for nausea or vomiting. 05/20/21  Yes [provider]  Oxycodone HCl 10 MG TABS 1 tab every 4-6 hr prn for chronic pain Patient taking differently: Take 10  mg by mouth every 4 (four) hours as needed (chronic pain). 1 tab every 4-6 hr prn for chronic pain 05/17/21  Yes Kuneff, Renee A, DO  PARoxetine (PAXIL) 10 MG tablet Take 10 mg by mouth every morning. 05/20/21  Yes [provider]  senna-docusate (SENOKOT-S) 8.6-50 MG tablet Take 2 tablets by mouth at bedtime. Patient taking differently: Take 2 tablets by mouth at bedtime as needed for mild constipation. 03/26/21  Yes Kuneff, Renee A, DO  sitaGLIPtin-metformin (JANUMET) 50-1000 MG tablet Take 1 tablet by mouth at bedtime. 03/26/21  Yes Kuneff, Renee A, DO  metoprolol succinate (TOPROL-XL) 50 MG 24 hr tablet Take 50 mg by mouth daily. 04/03/21   [provider]  polyethylene glycol powder (GLYCOLAX/MIRALAX) 17 GM/SCOOP powder Dissolve 1 capful (17 g) in water and drink 2 (two) times daily. Patient taking differently: Take 17 g by mouth daily. 03/26/21   Kuneff, Renee A, DO     Vital Signs: BP 118/64 (BP Location: Left Arm)    Pulse 77    Temp 98.1 F (36.7 C) (Oral)    Resp 18    Ht 5\' 3"  (1.6 m)    Wt 147 lb (66.7 kg)    SpO2 97%    BMI 26.04 kg/m   Incision sites are without erythema or tenderness.  One of the sites just right of midline over the T12 access site has minimal oozing.  Otherwise, incision sites are unremarkable.  Imaging: MR THORACIC SPINE W WO CONTRAST  Result Date: 05/31/2021 CLINICAL DATA:  Monitor musculoskeletal neoplasm. Mid back pain for 7 months EXAM: MRI THORACIC WITHOUT AND WITH CONTRAST TECHNIQUE: Multiplanar and multiecho pulse sequences of the thoracic spine were obtained without and with intravenous contrast. CONTRAST:  12mL GADAVIST GADOBUTROL 1 MMOL/ML IV SOLN COMPARISON:  03/05/2021 FINDINGS: Alignment:  Exaggerated thoracic kyphosis.  No listhesis. Vertebrae: Interval healing of the T7 and T8 compression fractures with mild height loss. There is been interval T6, T10, T12, L1, and L2 insufficiency fractures. Marrow edema is greatest at T10 and  T12. Height loss is mild at each level with no significant retropulsion. No discrete underlying lesion at these levels today or previously. Cord:  No hematoma or cord edema. Paraspinal and other soft tissues: Atrophy of intrinsic back muscles. Disc levels: Facet spurring and ligamentum flavum thickening greatest at T8-9 and T9-10 with spurs narrowing the foramina from behind. No herniation or impingement. IMPRESSION: 1. T6, T10, T12, L1, and L2 compression fractures since September 2022. Based on degree of marrow edema, T10 and T12 fractures are the most recent. 2. There has been healing of the T7 and T8 compression fractures with stable mild height loss. No retropulsion or posttraumatic impingement. 3. Diffuse marrow signal abnormality in this patient with known myeloproliferative disease. No focal, destructive lesion. Electronically Signed   By: Jorje Guild M.D.   On: 05/31/2021 04:56    Labs:  CBC: Recent Labs    05/27/21 0813 05/31/21 0745 05/31/21 2230 06/01/21 0750  WBC 4.8 7.1 6.6 5.8  HGB 7.7* 9.6* 8.6* 8.6*  HCT 25.1* 31.0* 28.1* 27.3*  PLT 39* 63* 47* 49*    COAGS: Recent Labs    10/28/20 1806 05/31/21 2132  INR 1.2 1.4*    BMP: Recent Labs    05/24/21 0851 05/27/21 0813 05/31/21 0745 06/01/21 0749  NA 140 140 141 138  K 4.1 3.9 4.0 3.8  CL 109 106 107 107  CO2 24 27 25 27   GLUCOSE 125* 138* 224* 193*  BUN 17 15 13 9   CALCIUM 8.0* 8.4* 8.4* 7.9*  CREATININE 0.72 0.73 0.79 0.73  GFRNONAA >60 >60 >60 >60    LIVER FUNCTION TESTS: Recent Labs    05/17/21 0738 05/24/21 0851 05/27/21 0813 05/31/21 0745  BILITOT 0.6 0.7 0.6 1.0  AST 15 17 17 19   ALT 14 17 15 20   ALKPHOS 145* 159* 137* 140*  PROT 6.5 6.5 6.5 6.9  ALBUMIN 3.0* 3.1* 3.1* 3.3*    Assessment and Plan:  1 day s/p kyphoplasty --improved bleeding, with oozing from only one site --BP has normalized --dressing changed, quick clot bandage over the one oozing site, traditional gauze over  remaining sites.  Instructions given for dressing changes. --ok to d/c from [facility] perspective, will leave plans to hospitalist.   Electronically Signed: Pasty Spillers, PA 06/01/2021, 11:32 AM   I spent a total of 15 Minutes at the the patient's bedside AND on the patient's hospital floor or unit, greater than 50% of which was counseling/coordinating care for kyphoplasty.

## 2021-06-01 NOTE — TOC Transition Note (Signed)
Transition of Care [facility]) - CM/SW Discharge Note   Patient Details  Name: Kathleen Gibson MRN: [id number] Date of Birth: 1950/09/27  Transition of [facility] CM/SW Contact:  Pollie Friar, RN Phone Number: 06/01/2021, 12:11 PM   Clinical Narrative:    Patient is discharging home with self care. She lives with her sister and BIL. She has: rollator/ hospital bed and 3 in 1. She states her sister over sees her medications at home. Her daughter is to provide needed transport home today.    Final next level of care: Home/Self Care Barriers to Discharge: No Barriers Identified   Patient Goals and CMS Choice        Discharge Placement                       Discharge Plan and Services                                     Social Determinants of Health (SDOH) Interventions     Readmission Risk Interventions No flowsheet data found.

## 2021-06-03 ENCOUNTER — Inpatient Hospital Stay: Payer: Medicare HMO

## 2021-06-03 ENCOUNTER — Other Ambulatory Visit: Payer: Self-pay

## 2021-06-03 DIAGNOSIS — Z23 Encounter for immunization: Secondary | ICD-10-CM | POA: Diagnosis not present

## 2021-06-03 DIAGNOSIS — E538 Deficiency of other specified B group vitamins: Secondary | ICD-10-CM | POA: Diagnosis not present

## 2021-06-03 DIAGNOSIS — Z79899 Other long term (current) drug therapy: Secondary | ICD-10-CM | POA: Diagnosis not present

## 2021-06-03 DIAGNOSIS — R69 Illness, unspecified: Secondary | ICD-10-CM | POA: Diagnosis not present

## 2021-06-03 DIAGNOSIS — D649 Anemia, unspecified: Secondary | ICD-10-CM | POA: Diagnosis not present

## 2021-06-03 DIAGNOSIS — I10 Essential (primary) hypertension: Secondary | ICD-10-CM | POA: Diagnosis not present

## 2021-06-03 DIAGNOSIS — E119 Type 2 diabetes mellitus without complications: Secondary | ICD-10-CM | POA: Diagnosis not present

## 2021-06-03 DIAGNOSIS — C9310 Chronic myelomonocytic leukemia not having achieved remission: Secondary | ICD-10-CM

## 2021-06-03 DIAGNOSIS — D696 Thrombocytopenia, unspecified: Secondary | ICD-10-CM

## 2021-06-03 DIAGNOSIS — D693 Immune thrombocytopenic purpura: Secondary | ICD-10-CM | POA: Diagnosis not present

## 2021-06-03 DIAGNOSIS — Z5111 Encounter for antineoplastic chemotherapy: Secondary | ICD-10-CM | POA: Diagnosis not present

## 2021-06-03 DIAGNOSIS — Z452 Encounter for adjustment and management of vascular access device: Secondary | ICD-10-CM

## 2021-06-03 LAB — CMP (CANCER CENTER ONLY)
ALT: 11 U/L (ref 0–44)
AST: 12 U/L — ABNORMAL LOW (ref 15–41)
Albumin: 3.3 g/dL — ABNORMAL LOW (ref 3.5–5.0)
Alkaline Phosphatase: 128 U/L — ABNORMAL HIGH (ref 38–126)
Anion gap: 5 (ref 5–15)
BUN: 11 mg/dL (ref 8–23)
CO2: 27 mmol/L (ref 22–32)
Calcium: 8.6 mg/dL — ABNORMAL LOW (ref 8.9–10.3)
Chloride: 107 mmol/L (ref 98–111)
Creatinine: 0.63 mg/dL (ref 0.44–1.00)
GFR, Estimated: >60 mL/min (ref >60)
Glucose, Bld: 149 mg/dL — ABNORMAL HIGH (ref 70–99)
Potassium: 4.1 mmol/L (ref 3.5–5.1)
Sodium: 139 mmol/L (ref 135–145)
Total Bilirubin: 0.6 mg/dL (ref 0.3–1.2)
Total Protein: 6.4 g/dL — ABNORMAL LOW (ref 6.5–8.1)

## 2021-06-03 LAB — SAMPLE TO BLOOD BANK

## 2021-06-03 LAB — CBC WITH DIFFERENTIAL (CANCER CENTER ONLY)
Abs Immature Granulocytes: 0.39 10*3/uL — ABNORMAL HIGH (ref 0.00–0.07)
Basophils Absolute: 0.1 10*3/uL (ref 0.0–0.1)
Basophils Relative: 1 %
Eosinophils Absolute: 0.1 10*3/uL (ref 0.0–0.5)
Eosinophils Relative: 1 %
HCT: 28.4 % — ABNORMAL LOW (ref 36.0–46.0)
Hemoglobin: 8.7 g/dL — ABNORMAL LOW (ref 12.0–15.0)
Immature Granulocytes: 6 %
Lymphocytes Relative: 10 %
Lymphs Abs: 0.7 10*3/uL (ref 0.7–4.0)
MCH: 25.8 pg — ABNORMAL LOW (ref 26.0–34.0)
MCHC: 30.6 g/dL (ref 30.0–36.0)
MCV: 84.3 fL (ref 80.0–100.0)
Monocytes Absolute: 0.8 10*3/uL (ref 0.1–1.0)
Monocytes Relative: 11 %
Neutro Abs: 5 10*3/uL (ref 1.7–7.7)
Neutrophils Relative %: 71 %
Platelet Count: 46 10*3/uL — ABNORMAL LOW (ref 150–400)
RBC: 3.37 MIL/uL — ABNORMAL LOW (ref 3.87–5.11)
RDW: 17.9 % — ABNORMAL HIGH (ref 11.5–15.5)
WBC Count: 7.1 10*3/uL (ref 4.0–10.5)
nRBC: 0 % (ref 0.0–0.2)

## 2021-06-03 MED ORDER — SODIUM CHLORIDE 0.9% FLUSH
10.0000 mL | Freq: Once | INTRAVENOUS | Status: AC
  Administered 2021-06-03: 09:00:00 10 mL via INTRAVENOUS

## 2021-06-03 MED ORDER — HEPARIN SOD (PORK) LOCK FLUSH 100 UNIT/ML IV SOLN
500.0000 [IU] | Freq: Once | INTRAVENOUS | Status: AC
  Administered 2021-06-03: 09:00:00 500 [IU] via INTRAVENOUS

## 2021-06-03 NOTE — Progress Notes (Signed)
No platelet transfusion today per Dr. Burr Medico. Patient aware and stated understanding.

## 2021-06-08 ENCOUNTER — Inpatient Hospital Stay: Payer: Medicare HMO

## 2021-06-08 ENCOUNTER — Other Ambulatory Visit: Payer: Self-pay

## 2021-06-08 DIAGNOSIS — Z23 Encounter for immunization: Secondary | ICD-10-CM | POA: Diagnosis not present

## 2021-06-08 DIAGNOSIS — R69 Illness, unspecified: Secondary | ICD-10-CM | POA: Diagnosis not present

## 2021-06-08 DIAGNOSIS — Z79899 Other long term (current) drug therapy: Secondary | ICD-10-CM | POA: Diagnosis not present

## 2021-06-08 DIAGNOSIS — I10 Essential (primary) hypertension: Secondary | ICD-10-CM | POA: Diagnosis not present

## 2021-06-08 DIAGNOSIS — E119 Type 2 diabetes mellitus without complications: Secondary | ICD-10-CM | POA: Diagnosis not present

## 2021-06-08 DIAGNOSIS — D696 Thrombocytopenia, unspecified: Secondary | ICD-10-CM

## 2021-06-08 DIAGNOSIS — Z5111 Encounter for antineoplastic chemotherapy: Secondary | ICD-10-CM | POA: Diagnosis not present

## 2021-06-08 DIAGNOSIS — C9310 Chronic myelomonocytic leukemia not having achieved remission: Secondary | ICD-10-CM | POA: Diagnosis not present

## 2021-06-08 DIAGNOSIS — E538 Deficiency of other specified B group vitamins: Secondary | ICD-10-CM | POA: Diagnosis not present

## 2021-06-08 DIAGNOSIS — D693 Immune thrombocytopenic purpura: Secondary | ICD-10-CM | POA: Diagnosis not present

## 2021-06-08 DIAGNOSIS — D649 Anemia, unspecified: Secondary | ICD-10-CM | POA: Diagnosis not present

## 2021-06-08 LAB — CBC WITH DIFFERENTIAL (CANCER CENTER ONLY)
Abs Immature Granulocytes: 0.57 10*3/uL — ABNORMAL HIGH (ref 0.00–0.07)
Basophils Absolute: 0.1 10*3/uL (ref 0.0–0.1)
Basophils Relative: 1 %
Eosinophils Absolute: 0.1 10*3/uL (ref 0.0–0.5)
Eosinophils Relative: 1 %
HCT: 30 % — ABNORMAL LOW (ref 36.0–46.0)
Hemoglobin: 9.1 g/dL — ABNORMAL LOW (ref 12.0–15.0)
Immature Granulocytes: 5 %
Lymphocytes Relative: 9 %
Lymphs Abs: 1 10*3/uL (ref 0.7–4.0)
MCH: 25.6 pg — ABNORMAL LOW (ref 26.0–34.0)
MCHC: 30.3 g/dL (ref 30.0–36.0)
MCV: 84.5 fL (ref 80.0–100.0)
Monocytes Absolute: 1.6 10*3/uL — ABNORMAL HIGH (ref 0.1–1.0)
Monocytes Relative: 15 %
Neutro Abs: 7.2 10*3/uL (ref 1.7–7.7)
Neutrophils Relative %: 69 %
Platelet Count: 17 10*3/uL — ABNORMAL LOW (ref 150–400)
RBC: 3.55 MIL/uL — ABNORMAL LOW (ref 3.87–5.11)
RDW: 18.9 % — ABNORMAL HIGH (ref 11.5–15.5)
WBC Count: 10.5 10*3/uL (ref 4.0–10.5)
nRBC: 0 % (ref 0.0–0.2)

## 2021-06-08 LAB — CMP (CANCER CENTER ONLY)
ALT: 17 U/L (ref 0–44)
AST: 19 U/L (ref 15–41)
Albumin: 3.5 g/dL (ref 3.5–5.0)
Alkaline Phosphatase: 134 U/L — ABNORMAL HIGH (ref 38–126)
Anion gap: 5 (ref 5–15)
BUN: 15 mg/dL (ref 8–23)
CO2: 27 mmol/L (ref 22–32)
Calcium: 8.7 mg/dL — ABNORMAL LOW (ref 8.9–10.3)
Chloride: 108 mmol/L (ref 98–111)
Creatinine: 0.68 mg/dL (ref 0.44–1.00)
GFR, Estimated: >60 mL/min (ref >60)
Glucose, Bld: 144 mg/dL — ABNORMAL HIGH (ref 70–99)
Potassium: 4.4 mmol/L (ref 3.5–5.1)
Sodium: 140 mmol/L (ref 135–145)
Total Bilirubin: 0.7 mg/dL (ref 0.3–1.2)
Total Protein: 6.6 g/dL (ref 6.5–8.1)

## 2021-06-08 LAB — SAMPLE TO BLOOD BANK

## 2021-06-08 MED ORDER — SODIUM CHLORIDE 0.9% FLUSH
10.0000 mL | Freq: Once | INTRAVENOUS | Status: AC | PRN
  Administered 2021-06-08: 08:00:00 10 mL

## 2021-06-08 NOTE — Progress Notes (Signed)
Per MD parameters, pt does not need transfusion today. Pt made aware and agreeable, pt received print out copy of labs. Pt made aware of schedule later on this week. No further questions or needs at this time.

## 2021-06-09 LAB — SURGICAL PATHOLOGY

## 2021-06-10 ENCOUNTER — Telehealth: Payer: Self-pay | Admitting: Family Medicine

## 2021-06-10 MED ORDER — ONETOUCH VERIO W/DEVICE KIT
1.0000 | PACK | Freq: Once | 1 refills | Status: AC
Start: 2021-06-10 — End: 2021-06-10

## 2021-06-10 NOTE — Telephone Encounter (Signed)
Erin/ nurse case manager/[employer] called in for Pt needing 1 touch glucose monitor    [facility] [id number] [facility] [employer] [place], [place] Phone:  [phone]  Fax:  [phone]

## 2021-06-10 NOTE — Telephone Encounter (Signed)
Rx sent 

## 2021-06-11 ENCOUNTER — Inpatient Hospital Stay: Payer: Medicare HMO

## 2021-06-11 ENCOUNTER — Other Ambulatory Visit: Payer: Self-pay

## 2021-06-11 VITALS — BP 113/64 | HR 74 | Temp 98.2°F | Resp 17 | Wt 138.5 lb

## 2021-06-11 DIAGNOSIS — Z452 Encounter for adjustment and management of vascular access device: Secondary | ICD-10-CM

## 2021-06-11 DIAGNOSIS — D649 Anemia, unspecified: Secondary | ICD-10-CM | POA: Diagnosis not present

## 2021-06-11 DIAGNOSIS — I10 Essential (primary) hypertension: Secondary | ICD-10-CM | POA: Diagnosis not present

## 2021-06-11 DIAGNOSIS — Z23 Encounter for immunization: Secondary | ICD-10-CM | POA: Diagnosis not present

## 2021-06-11 DIAGNOSIS — D696 Thrombocytopenia, unspecified: Secondary | ICD-10-CM

## 2021-06-11 DIAGNOSIS — E119 Type 2 diabetes mellitus without complications: Secondary | ICD-10-CM | POA: Diagnosis not present

## 2021-06-11 DIAGNOSIS — C9310 Chronic myelomonocytic leukemia not having achieved remission: Secondary | ICD-10-CM

## 2021-06-11 DIAGNOSIS — R69 Illness, unspecified: Secondary | ICD-10-CM | POA: Diagnosis not present

## 2021-06-11 DIAGNOSIS — D693 Immune thrombocytopenic purpura: Secondary | ICD-10-CM

## 2021-06-11 DIAGNOSIS — Z79899 Other long term (current) drug therapy: Secondary | ICD-10-CM | POA: Diagnosis not present

## 2021-06-11 DIAGNOSIS — E538 Deficiency of other specified B group vitamins: Secondary | ICD-10-CM | POA: Diagnosis not present

## 2021-06-11 DIAGNOSIS — Z5111 Encounter for antineoplastic chemotherapy: Secondary | ICD-10-CM | POA: Diagnosis not present

## 2021-06-11 LAB — CBC WITH DIFFERENTIAL (CANCER CENTER ONLY)
Abs Immature Granulocytes: 0.27 10*3/uL — ABNORMAL HIGH (ref 0.00–0.07)
Basophils Absolute: 0.1 10*3/uL (ref 0.0–0.1)
Basophils Relative: 1 %
Eosinophils Absolute: 0 10*3/uL (ref 0.0–0.5)
Eosinophils Relative: 1 %
HCT: 28.7 % — ABNORMAL LOW (ref 36.0–46.0)
Hemoglobin: 9.1 g/dL — ABNORMAL LOW (ref 12.0–15.0)
Immature Granulocytes: 3 %
Lymphocytes Relative: 11 %
Lymphs Abs: 0.9 10*3/uL (ref 0.7–4.0)
MCH: 26.6 pg (ref 26.0–34.0)
MCHC: 31.7 g/dL (ref 30.0–36.0)
MCV: 83.9 fL (ref 80.0–100.0)
Monocytes Absolute: 1.6 10*3/uL — ABNORMAL HIGH (ref 0.1–1.0)
Monocytes Relative: 19 %
Neutro Abs: 5.6 10*3/uL (ref 1.7–7.7)
Neutrophils Relative %: 65 %
Platelet Count: 7 10*3/uL — CL (ref 150–400)
RBC: 3.42 MIL/uL — ABNORMAL LOW (ref 3.87–5.11)
RDW: 19.6 % — ABNORMAL HIGH (ref 11.5–15.5)
WBC Count: 8.4 10*3/uL (ref 4.0–10.5)
nRBC: 0 % (ref 0.0–0.2)

## 2021-06-11 LAB — CMP (CANCER CENTER ONLY)
ALT: 22 U/L (ref 0–44)
AST: 21 U/L (ref 15–41)
Albumin: 3.5 g/dL (ref 3.5–5.0)
Alkaline Phosphatase: 120 U/L (ref 38–126)
Anion gap: 6 (ref 5–15)
BUN: 18 mg/dL (ref 8–23)
CO2: 27 mmol/L (ref 22–32)
Calcium: 8.7 mg/dL — ABNORMAL LOW (ref 8.9–10.3)
Chloride: 106 mmol/L (ref 98–111)
Creatinine: 0.68 mg/dL (ref 0.44–1.00)
GFR, Estimated: >60 mL/min (ref >60)
Glucose, Bld: 232 mg/dL — ABNORMAL HIGH (ref 70–99)
Potassium: 4.4 mmol/L (ref 3.5–5.1)
Sodium: 139 mmol/L (ref 135–145)
Total Bilirubin: 0.7 mg/dL (ref 0.3–1.2)
Total Protein: 6.7 g/dL (ref 6.5–8.1)

## 2021-06-11 LAB — SAMPLE TO BLOOD BANK

## 2021-06-11 MED ORDER — SODIUM CHLORIDE 0.9% FLUSH
10.0000 mL | INTRAVENOUS | Status: DC | PRN
  Administered 2021-06-11: 08:00:00 10 mL via INTRAVENOUS

## 2021-06-11 MED ORDER — SODIUM CHLORIDE 0.9% FLUSH
10.0000 mL | INTRAVENOUS | Status: AC | PRN
  Administered 2021-06-11: 11:00:00 10 mL

## 2021-06-11 MED ORDER — ROMIPLOSTIM INJECTION 500 MCG
10.0000 ug/kg | Freq: Once | SUBCUTANEOUS | Status: AC
  Administered 2021-06-11: 09:00:00 665 ug via SUBCUTANEOUS
  Filled 2021-06-11: qty 0.33

## 2021-06-11 MED ORDER — HEPARIN SOD (PORK) LOCK FLUSH 100 UNIT/ML IV SOLN
500.0000 [IU] | Freq: Every day | INTRAVENOUS | Status: AC | PRN
  Administered 2021-06-11: 11:00:00 250 [IU]

## 2021-06-11 MED ORDER — HEPARIN SOD (PORK) LOCK FLUSH 100 UNIT/ML IV SOLN
500.0000 [IU] | Freq: Once | INTRAVENOUS | Status: AC
  Administered 2021-06-11: 08:00:00 500 [IU] via INTRAVENOUS

## 2021-06-11 MED ORDER — SODIUM CHLORIDE 0.9% IV SOLUTION
250.0000 mL | Freq: Once | INTRAVENOUS | Status: AC
  Administered 2021-06-11: 250 mL via INTRAVENOUS

## 2021-06-11 NOTE — Patient Instructions (Signed)
Platelet Transfusion ?A platelet transfusion is a procedure in which a person receives donated platelets through an IV. Platelets are parts of blood that stick together and form a clot to help the body stop bleeding after an injury. If you have too few platelets, your blood may have trouble clotting. This may cause you to bleed and bruise very easily. ?You may need a platelet transfusion if you have a condition that causes a low number of platelets (thrombocytopenia). A platelet transfusion may be used to stop or prevent excessive bleeding. ?Tell a health care provider about: ?Any reactions you have had during previous transfusions. ?Any allergies you have. ?All medicines you are taking, including vitamins, herbs, eye drops, creams, and over-the-counter medicines. ?Any bleeding problems you have. ?Any surgeries you have had. ?Any medical conditions you have. ?Whether you are pregnant or may be pregnant. ?What are the risks? ?Generally, this is a safe procedure. However, problems may occur, including: ?Fever. ?Infection. ?Allergic reaction to the donated (donor) platelets. ?Your body's disease-fighting system (immune system) attacking the donor platelets (hemolytic reaction). This is rare. ?A rare reaction that causes lung damage (transfusion-related acute lung injury). ?What happens before the procedure? ?Medicines ?Ask your health care provider about: ?Changing or stopping your regular medicines. This is especially important if you are taking diabetes medicines or blood thinners. ?Taking medicines such as aspirin and ibuprofen. These medicines can thin your blood. Do not take these medicines unless your health care provider tells you to take them. ?Taking over-the-counter medicines, vitamins, herbs, and supplements. ?General instructions ?You will have a blood test to determine your blood type. Your blood type determines what kind of platelets you will be given. ?Follow instructions from your health care provider  about eating or drinking restrictions. ?If you have had an allergic reaction to a transfusion in the past, you may be given medicine to help prevent a reaction. ?Your temperature, blood pressure, pulse, and breathing will be monitored. ?What happens during the procedure? ? ?An IV will be inserted into one of your veins. ?For your safety, two health care providers will verify your identity along with the donor platelets about to be infused. ?A bag of donor platelets will be connected to your IV. The platelets will flow into your bloodstream. This usually takes 30-60 minutes. ?Your temperature, blood pressure, pulse, and breathing will be monitored during the transfusion. This helps detect early signs of any reaction. ?You will also be monitored for other symptoms that may indicate a reaction, including chills, hives, or itching. ?If you have signs of a reaction at any time, your transfusion will be stopped, and you may be given medicine to help manage the reaction. ?When your transfusion is complete, your IV will be removed. ?Pressure may be applied to the IV site for a few minutes to stop any bleeding. ?The IV site will be covered with a bandage (dressing). ?The procedure may vary among health care providers and hospitals. ?What can I expect after the procedure? ?Your blood pressure, temperature, pulse, and breathing will be monitored until you leave the hospital or clinic. ?You may have some bruising and soreness at your IV site. ?Follow these instructions at home: ?Medicines ?Take over-the-counter and prescription medicines only as told by your health care provider. ?Talk with your health care provider before you take any medicines that contain aspirin or NSAIDs, such as ibuprofen. These medicines increase your risk for dangerous bleeding. ?IV site care ?Check your IV site every day for signs of infection. Check for: ?  Redness, swelling, or pain. ?Fluid or blood. If fluid or blood drains from your IV site, use your  hands to press down firmly on a bandage covering the area for a minute or two. Doing this should stop the bleeding. ?Warmth. ?Pus or a bad smell. ?General instructions ?Change or remove your dressing as told by your health care provider. ?Return to your normal activities as told by your health care provider. Ask your health care provider what activities are safe for you. ?Do not take baths, swim, or use a hot tub until your health care provider approves. Ask your health care provider if you may take showers. ?Keep all follow-up visits. This is important. ?Contact a health care provider if: ?You have a headache that does not go away with medicine. ?You have hives, rash, or itchy skin. ?You have nausea or vomiting. ?You feel unusually tired or weak. ?You have signs of infection at your IV site. ?Get help right away if: ?You have a fever or chills. ?You urinate less often than usual. ?Your urine is darker colored than normal. ?You have any of the following: ?Trouble breathing. ?Pain in your back, abdomen, or chest. ?Cool, clammy skin. ?A fast heartbeat. ?Summary ?Platelets are tiny pieces of blood cells that clump together to form a blood clot when you have an injury. If you have too few platelets, your blood may have trouble clotting. ?A platelet transfusion is a procedure in which you receive donated platelets through an IV. ?A platelet transfusion may be used to stop or prevent excessive bleeding. ?After the procedure, check your IV site every day for signs of infection. ?This information is not intended to replace advice given to you by your health care provider. Make sure you discuss any questions you have with your health care provider. ?Document Revised: 12/03/2020 Document Reviewed: 12/03/2020 ?Elsevier Patient Education ? 2022 [employer] ? ?

## 2021-06-13 LAB — PREPARE PLATELET PHERESIS: Unit division: 0

## 2021-06-13 LAB — BPAM PLATELET PHERESIS
Blood Product Expiration Date: 202212312359
ISSUE DATE / TIME: 202212301027
Unit Type and Rh: 5100

## 2021-06-15 ENCOUNTER — Other Ambulatory Visit: Payer: Self-pay

## 2021-06-15 ENCOUNTER — Telehealth: Payer: Self-pay

## 2021-06-15 ENCOUNTER — Inpatient Hospital Stay: Payer: Medicare HMO | Attending: Hematology

## 2021-06-15 ENCOUNTER — Inpatient Hospital Stay: Payer: Medicare HMO

## 2021-06-15 DIAGNOSIS — Z5111 Encounter for antineoplastic chemotherapy: Secondary | ICD-10-CM | POA: Insufficient documentation

## 2021-06-15 DIAGNOSIS — D696 Thrombocytopenia, unspecified: Secondary | ICD-10-CM

## 2021-06-15 DIAGNOSIS — E1142 Type 2 diabetes mellitus with diabetic polyneuropathy: Secondary | ICD-10-CM | POA: Diagnosis not present

## 2021-06-15 DIAGNOSIS — R69 Illness, unspecified: Secondary | ICD-10-CM | POA: Diagnosis not present

## 2021-06-15 DIAGNOSIS — I10 Essential (primary) hypertension: Secondary | ICD-10-CM | POA: Diagnosis not present

## 2021-06-15 DIAGNOSIS — D693 Immune thrombocytopenic purpura: Secondary | ICD-10-CM | POA: Diagnosis present

## 2021-06-15 DIAGNOSIS — C9310 Chronic myelomonocytic leukemia not having achieved remission: Secondary | ICD-10-CM

## 2021-06-15 DIAGNOSIS — D510 Vitamin B12 deficiency anemia due to intrinsic factor deficiency: Secondary | ICD-10-CM | POA: Diagnosis not present

## 2021-06-15 DIAGNOSIS — F410 Panic disorder [episodic paroxysmal anxiety] without agoraphobia: Secondary | ICD-10-CM | POA: Diagnosis not present

## 2021-06-15 DIAGNOSIS — Z452 Encounter for adjustment and management of vascular access device: Secondary | ICD-10-CM

## 2021-06-15 DIAGNOSIS — Z79899 Other long term (current) drug therapy: Secondary | ICD-10-CM | POA: Insufficient documentation

## 2021-06-15 DIAGNOSIS — R519 Headache, unspecified: Secondary | ICD-10-CM | POA: Insufficient documentation

## 2021-06-15 LAB — CMP (CANCER CENTER ONLY)
ALT: 15 U/L (ref 0–44)
AST: 17 U/L (ref 15–41)
Albumin: 3.6 g/dL (ref 3.5–5.0)
Alkaline Phosphatase: 124 U/L (ref 38–126)
Anion gap: 7 (ref 5–15)
BUN: 17 mg/dL (ref 8–23)
CO2: 26 mmol/L (ref 22–32)
Calcium: 8.6 mg/dL — ABNORMAL LOW (ref 8.9–10.3)
Chloride: 107 mmol/L (ref 98–111)
Creatinine: 0.67 mg/dL (ref 0.44–1.00)
GFR, Estimated: >60 mL/min (ref >60)
Glucose, Bld: 195 mg/dL — ABNORMAL HIGH (ref 70–99)
Potassium: 4.1 mmol/L (ref 3.5–5.1)
Sodium: 140 mmol/L (ref 135–145)
Total Bilirubin: 0.7 mg/dL (ref 0.3–1.2)
Total Protein: 6.8 g/dL (ref 6.5–8.1)

## 2021-06-15 LAB — CBC WITH DIFFERENTIAL (CANCER CENTER ONLY)
Abs Immature Granulocytes: 0.04 10*3/uL (ref 0.00–0.07)
Basophils Absolute: 0 10*3/uL (ref 0.0–0.1)
Basophils Relative: 0 %
Eosinophils Absolute: 0 10*3/uL (ref 0.0–0.5)
Eosinophils Relative: 0 %
HCT: 27.9 % — ABNORMAL LOW (ref 36.0–46.0)
Hemoglobin: 8.7 g/dL — ABNORMAL LOW (ref 12.0–15.0)
Immature Granulocytes: 1 %
Lymphocytes Relative: 10 %
Lymphs Abs: 0.7 10*3/uL (ref 0.7–4.0)
MCH: 26.4 pg (ref 26.0–34.0)
MCHC: 31.2 g/dL (ref 30.0–36.0)
MCV: 84.8 fL (ref 80.0–100.0)
Monocytes Absolute: 1.1 10*3/uL — ABNORMAL HIGH (ref 0.1–1.0)
Monocytes Relative: 16 %
Neutro Abs: 4.9 10*3/uL (ref 1.7–7.7)
Neutrophils Relative %: 73 %
Platelet Count: 8 10*3/uL — CL (ref 150–400)
RBC: 3.29 MIL/uL — ABNORMAL LOW (ref 3.87–5.11)
RDW: 20 % — ABNORMAL HIGH (ref 11.5–15.5)
WBC Count: 6.7 10*3/uL (ref 4.0–10.5)
nRBC: 0 % (ref 0.0–0.2)

## 2021-06-15 LAB — SAMPLE TO BLOOD BANK

## 2021-06-15 MED ORDER — HEPARIN SOD (PORK) LOCK FLUSH 100 UNIT/ML IV SOLN
500.0000 [IU] | Freq: Once | INTRAVENOUS | Status: AC
  Administered 2021-06-15: 500 [IU] via INTRAVENOUS

## 2021-06-15 MED ORDER — SODIUM CHLORIDE 0.9% IV SOLUTION: 250.0000 mL | Freq: Once | INTRAVENOUS | Status: AC

## 2021-06-15 MED ORDER — SODIUM CHLORIDE 0.9% FLUSH
10.0000 mL | INTRAVENOUS | Status: DC | PRN
  Administered 2021-06-15: 10 mL via INTRAVENOUS

## 2021-06-15 MED ORDER — SODIUM CHLORIDE 0.9 % IV SOLN: INTRAVENOUS | Status: AC

## 2021-06-15 NOTE — Telephone Encounter (Signed)
CRITICAL VALUE STICKER  CRITICAL VALUE: PLT = 8 and Hgb = 8.7  RECEIVER (on-site recipient of call): Yetta Glassman, [place] NOTIFIED: 06/15/21 at 8:20am  MESSENGER (representative from lab): Pam  MD NOTIFIED: FENG  TIME OF NOTIFICATION: 06/15/21 at 8:23am  RESPONSE: Notification given to Tammi Sou, RN for follow-up with the pt.

## 2021-06-16 LAB — PREPARE PLATELET PHERESIS: Unit division: 0

## 2021-06-16 LAB — BPAM PLATELET PHERESIS
Blood Product Expiration Date: 202301042359
ISSUE DATE / TIME: 202301030937
Unit Type and Rh: 5100

## 2021-06-17 DIAGNOSIS — R69 Illness, unspecified: Secondary | ICD-10-CM | POA: Diagnosis not present

## 2021-06-17 DIAGNOSIS — D696 Thrombocytopenia, unspecified: Secondary | ICD-10-CM | POA: Diagnosis not present

## 2021-06-17 DIAGNOSIS — G629 Polyneuropathy, unspecified: Secondary | ICD-10-CM | POA: Diagnosis not present

## 2021-06-17 DIAGNOSIS — E119 Type 2 diabetes mellitus without complications: Secondary | ICD-10-CM | POA: Diagnosis not present

## 2021-06-17 DIAGNOSIS — M5489 Other dorsalgia: Secondary | ICD-10-CM | POA: Diagnosis not present

## 2021-06-17 DIAGNOSIS — C9310 Chronic myelomonocytic leukemia not having achieved remission: Secondary | ICD-10-CM | POA: Diagnosis not present

## 2021-06-17 DIAGNOSIS — I10 Essential (primary) hypertension: Secondary | ICD-10-CM | POA: Diagnosis not present

## 2021-06-17 DIAGNOSIS — Z9884 Bariatric surgery status: Secondary | ICD-10-CM | POA: Diagnosis not present

## 2021-06-18 ENCOUNTER — Ambulatory Visit: Payer: Medicaid Other

## 2021-06-18 ENCOUNTER — Other Ambulatory Visit: Payer: Medicaid Other

## 2021-06-18 ENCOUNTER — Other Ambulatory Visit: Payer: Self-pay | Admitting: Hematology

## 2021-06-18 MED FILL — Dexamethasone Sodium Phosphate Inj 100 MG/10ML: INTRAMUSCULAR | Qty: 1 | Status: AC

## 2021-06-19 ENCOUNTER — Encounter: Payer: Self-pay | Admitting: Hematology

## 2021-06-21 ENCOUNTER — Inpatient Hospital Stay: Payer: Medicare HMO

## 2021-06-21 ENCOUNTER — Inpatient Hospital Stay (HOSPITAL_BASED_OUTPATIENT_CLINIC_OR_DEPARTMENT_OTHER): Payer: Medicare HMO | Admitting: Hematology

## 2021-06-21 ENCOUNTER — Other Ambulatory Visit: Payer: Self-pay

## 2021-06-21 VITALS — HR 90

## 2021-06-21 VITALS — BP 120/69 | HR 102 | Temp 98.0°F | Resp 17 | Ht 63.0 in | Wt 139.6 lb

## 2021-06-21 DIAGNOSIS — C9310 Chronic myelomonocytic leukemia not having achieved remission: Secondary | ICD-10-CM

## 2021-06-21 DIAGNOSIS — Z452 Encounter for adjustment and management of vascular access device: Secondary | ICD-10-CM

## 2021-06-21 DIAGNOSIS — I10 Essential (primary) hypertension: Secondary | ICD-10-CM | POA: Diagnosis not present

## 2021-06-21 DIAGNOSIS — E1142 Type 2 diabetes mellitus with diabetic polyneuropathy: Secondary | ICD-10-CM | POA: Diagnosis not present

## 2021-06-21 DIAGNOSIS — D693 Immune thrombocytopenic purpura: Secondary | ICD-10-CM | POA: Diagnosis not present

## 2021-06-21 DIAGNOSIS — R69 Illness, unspecified: Secondary | ICD-10-CM | POA: Diagnosis not present

## 2021-06-21 DIAGNOSIS — R519 Headache, unspecified: Secondary | ICD-10-CM | POA: Diagnosis not present

## 2021-06-21 DIAGNOSIS — D510 Vitamin B12 deficiency anemia due to intrinsic factor deficiency: Secondary | ICD-10-CM | POA: Diagnosis not present

## 2021-06-21 DIAGNOSIS — Z5111 Encounter for antineoplastic chemotherapy: Secondary | ICD-10-CM | POA: Diagnosis not present

## 2021-06-21 DIAGNOSIS — Z79899 Other long term (current) drug therapy: Secondary | ICD-10-CM | POA: Diagnosis not present

## 2021-06-21 DIAGNOSIS — D696 Thrombocytopenia, unspecified: Secondary | ICD-10-CM

## 2021-06-21 LAB — SAMPLE TO BLOOD BANK

## 2021-06-21 LAB — CBC WITH DIFFERENTIAL (CANCER CENTER ONLY)
Abs Immature Granulocytes: 0.02 10*3/uL (ref 0.00–0.07)
Basophils Absolute: 0 10*3/uL (ref 0.0–0.1)
Basophils Relative: 1 %
Eosinophils Absolute: 0 10*3/uL (ref 0.0–0.5)
Eosinophils Relative: 1 %
HCT: 26.6 % — ABNORMAL LOW (ref 36.0–46.0)
Hemoglobin: 8.2 g/dL — ABNORMAL LOW (ref 12.0–15.0)
Immature Granulocytes: 1 %
Lymphocytes Relative: 13 %
Lymphs Abs: 0.5 10*3/uL — ABNORMAL LOW (ref 0.7–4.0)
MCH: 26.1 pg (ref 26.0–34.0)
MCHC: 30.8 g/dL (ref 30.0–36.0)
MCV: 84.7 fL (ref 80.0–100.0)
Monocytes Absolute: 0.8 10*3/uL (ref 0.1–1.0)
Monocytes Relative: 18 %
Neutro Abs: 2.9 10*3/uL (ref 1.7–7.7)
Neutrophils Relative %: 66 %
Platelet Count: 17 10*3/uL — ABNORMAL LOW (ref 150–400)
RBC: 3.14 MIL/uL — ABNORMAL LOW (ref 3.87–5.11)
RDW: 20.1 % — ABNORMAL HIGH (ref 11.5–15.5)
WBC Count: 4.2 10*3/uL (ref 4.0–10.5)
nRBC: 0 % (ref 0.0–0.2)

## 2021-06-21 LAB — CMP (CANCER CENTER ONLY)
ALT: 12 U/L (ref 0–44)
AST: 13 U/L — ABNORMAL LOW (ref 15–41)
Albumin: 3.5 g/dL (ref 3.5–5.0)
Alkaline Phosphatase: 111 U/L (ref 38–126)
Anion gap: 6 (ref 5–15)
BUN: 17 mg/dL (ref 8–23)
CO2: 26 mmol/L (ref 22–32)
Calcium: 8.6 mg/dL — ABNORMAL LOW (ref 8.9–10.3)
Chloride: 107 mmol/L (ref 98–111)
Creatinine: 0.68 mg/dL (ref 0.44–1.00)
GFR, Estimated: >60 mL/min (ref >60)
Glucose, Bld: 216 mg/dL — ABNORMAL HIGH (ref 70–99)
Potassium: 4 mmol/L (ref 3.5–5.1)
Sodium: 139 mmol/L (ref 135–145)
Total Bilirubin: 0.9 mg/dL (ref 0.3–1.2)
Total Protein: 6.6 g/dL (ref 6.5–8.1)

## 2021-06-21 MED ORDER — SODIUM CHLORIDE 0.9% FLUSH
10.0000 mL | INTRAVENOUS | Status: DC | PRN
  Administered 2021-06-21: 10 mL via INTRAVENOUS

## 2021-06-21 MED ORDER — SODIUM CHLORIDE 0.9 % IV SOLN: Freq: Once | INTRAVENOUS | Status: AC

## 2021-06-21 MED ORDER — ROMIPLOSTIM INJECTION 500 MCG
10.0000 ug/kg | Freq: Once | SUBCUTANEOUS | Status: AC
  Administered 2021-06-21: 635 ug via SUBCUTANEOUS
  Filled 2021-06-21: qty 1.02

## 2021-06-21 MED ORDER — SODIUM CHLORIDE 0.9% FLUSH
10.0000 mL | INTRAVENOUS | Status: DC | PRN
  Administered 2021-06-21: 10 mL

## 2021-06-21 MED ORDER — HEPARIN SOD (PORK) LOCK FLUSH 100 UNIT/ML IV SOLN
250.0000 [IU] | Freq: Once | INTRAVENOUS | Status: AC | PRN
  Administered 2021-06-21: 250 [IU]

## 2021-06-21 MED ORDER — PALONOSETRON HCL INJECTION 0.25 MG/5ML
0.2500 mg | Freq: Once | INTRAVENOUS | Status: AC
  Administered 2021-06-21: 0.25 mg via INTRAVENOUS
  Filled 2021-06-21: qty 5

## 2021-06-21 MED ORDER — SODIUM CHLORIDE 0.9 % IV SOLN
10.0000 mg | Freq: Once | INTRAVENOUS | Status: AC
  Administered 2021-06-21: 10 mg via INTRAVENOUS
  Filled 2021-06-21: qty 10

## 2021-06-21 MED ORDER — SODIUM CHLORIDE 0.9 % IV SOLN
75.0000 mg/m2 | Freq: Once | INTRAVENOUS | Status: AC
  Administered 2021-06-21: 135 mg via INTRAVENOUS
  Filled 2021-06-21: qty 13.5

## 2021-06-21 MED FILL — Dexamethasone Sodium Phosphate Inj 100 MG/10ML: INTRAMUSCULAR | Qty: 1 | Status: AC

## 2021-06-21 NOTE — Progress Notes (Signed)
[place]   Telephone:(336) [phone] Fax:(336) [phone]   Clinic Follow up Note   Patient Care Team: [name], DO as PCP - General (Family Medicine) Loletha Carrow Kirke Corin, MD as Consulting Physician (Gastroenterology) Truitt Merle, MD as Consulting Physician (Hematology) Alda Berthold, DO as Consulting Physician (Neurology)  Date of Service:  06/21/2021  CHIEF COMPLAINT: f/u of [name], severe thrombocytopenia  CURRENT THERAPY:  Azacitidine days 1-5 q28 days, started 12/28/20 Nplate 32mcg/kg weekly  Platelet transfusion as needed (plt<15K)  ASSESSMENT & PLAN:  Kathleen Gibson is a 71 y.o. female with   1. [name] with severe thrombocytopenia  -Initially diagnosed with ITP in 2014. She lost follow up after 11/2017 and did not proceed with recommended bone marrow biopsy. -She was referred to ED 10/28/20 by her PCP for low plt count of 4k. She was hospitalized and treated with platelet transfusions, IVIG, oral prednisone $RemoveBeforeD'60mg'YkgVqIiMRcfhCU$  and Nplate injections. She tapered off prednisone given little response. -Bone marrow biopsy 10/30/20 felt to likely represent MDS/MPN, particularly [name].  Confirmed on slide review at [facility] by Dr. Linus Orn -She began treatment for severe refractory thrombocytopenia with weekly Rituxan x4 on 11/19/20, she did not respond  -She began azacitidine daily days 1-5 q. 28 days on 12/28/20 -She had worsening thrombocytopenia after stopping Nplate, restarted weekly Nplate 10 mcg/kg on 6/59/9357 -Bone marrow biopsy 03/18/21 showed stable disease, no increase in blasts. -her platelet count has fluctuated and was high enough for kyphoplasty procedure on 05/31/21.  -labs reviewed, plt 17k today. She will proceed with Vidaza today and daily for the rest of this week.   2.  Thoracic spine compression fractures -T10 and T12 kyphoplasty performed 05/31/21 -she reports improvement in pain and no longer uses thoracic brace. -I reviewed adding Zometa to her  treatment. She notes she has several dental issues that need to be addressed, but she has not found a dentist to help her at this time (in part due to insurance). She will call to get a dental appointment  -will hold on Zometa for now until she sees dentist    3. Normocytic anemia, moderate  -H/o pernicious anemia/vitamin B12 deficiency -Transfuse for Hgb </= 7.5 -Continue oral iron    4. Comorbidities: DM type 2 with polyneuropathy, HTN, h/o panic attack, headaches -Continue medication, follow-up PCP -Etiology of headaches unknown, managed with Tylenol -panic attack occurred once, managed with family support and lorazepam.     PLAN: -proceed with azacitidine today -continue azacitidine daily for the rest of this week as scheduled  -lab and 1u plately transfusion every Monday and Thursday x6 for plt<15K  -Vidaza injection 2/6-2/10 -f/u on 2/6 -she will have repeated BM biopsy at [facility] before next cycle chemo    No problem-specific Assessment & Plan notes found for this encounter.   SUMMARY OF ONCOLOGIC HISTORY: Oncology History  [name] (chronic myelomonocytic leukemia) ([place])  10/29/2020 Imaging   CT CAP  IMPRESSION: 1. Splenomegaly with pathologically enlarged lymph nodes above and below the diaphragm, with overall stable to minimally increased abdominal adenopathy and interval progression of the pelvic adenopathy. 2. Small volume abdominopelvic ascites with diffuse mesenteric stranding. 3. Scattered bilateral pulmonary micro nodules measuring 1-2 mm. 4. Distended gallbladder with some layering hyperdense material representing layering sludge and tiny stones seen on prior ultrasound. 5. Aortic atherosclerosis.   10/30/2020 Pathology Results   DIAGNOSIS:   BONE MARROW, ASPIRATE, CLOT, CORE:  -  Hypercellular bone marrow with panhyperplasia, atypia, and no  increase in blasts  -  See comment   PERIPHERAL BLOOD:  -  Marked thrombocytopenia  -  Absolute monocytosis  -   Normocytic anemia  -  See CBC data and comment   COMMENT:  The bone marrow is hypercellular for age (approximately 80%) with myeloid hyperplasia with maturational left shift, erythroid hyperplasia, and increased megakaryocytes.  Mild multilineage atypia is present. Blasts are not increased on aspirate smears (1% by manual differential count) or by CD34 immunohistochemistry on the core biopsy.  Concurrent flow cytometric analysis of the bone marrow aspirate demonstrates increased monocytes, and no increase in blasts or abnormal lymphoid population (see [id number]).  Monocytes are also increased in peripheral  blood, persistent per electronic medical record.  In aggregate, the  findings raise the possibility of a myeloid neoplasm with the  differential diagnosis including a low-grade myelodysplastic syndrome and chronic myelomonocytic leukemia (dysplastic type).    ADDENDUM:  A reticulin special stain performed on the bone marrow core biopsy reveals no significant increase in reticulin fibrosis.  ADDENDUM:  CYTOGENETIC RESULTS:  Karyotype: 46,XX[20]  Interpretation: NORMAL FEMALE KARYOTYPE   FISH RESULTS:  Results: NORMAL   ADDENDUM:  CD123 immunohistochemistry performed on the core biopsy highlights scattered aggregates of positively staining cells consistent with plasmacytoid dendritic cells.    10/30/2020 Pathology Results   DIAGNOSIS:   BONE MARROW; FLOW CYTOMETRIC ANALYSIS:  -  Increased monocytes  -  Scant B-cells present  -  No immunophenotypically aberrant T-cell population identified  -  No increase in blasts  -  See comment   COMMENT:  Monocytes are relatively increased (12% of all cells), without aberrant expression of CD56.  B-cells comprise <1% of total lymphocytes. CD34-positive blasts are not increased (<1% of all cells).  Correlation with concurrent morphology is recommended for complete diagnostic interpretation and overall blast enumeration (see [id number]).     10/30/2020 Pathology Results   FINAL MICROSCOPIC DIAGNOSIS:   A. LYMPH NODE, RIGHT AXILLARY, NEEDLE CORE BIOPSY:  -Lymphoid tissue present  -See comment   COMMENT:  The sections show several small needle core biopsy fragments of lymphoid tissue displaying degenerative cellular changes/necrosis and hence cannot be accurately evaluated.  Sample for flow cytometric analysis not available.  Immunohistochemical stain for CD3 and CD20 were performed with appropriate controls.  There is a mixture of T and B cells in their apparently respective compartments.  There is no definite metastatic malignancy.    11/11/2020 Pathology Results   DIAGNOSIS:   LEFT AXILLARY LYMPH NODE EXCISIONAL BIOPSY; FLOW CYTOMETRIC ANALYSIS:  -  No monotypic B-cell or immunophenotypically aberrant T-cell  population identified  -  See comment   COMMENT:  Flow cytometric analysis identifies B-cells with a normal kappa:lambda ratio of 1.7:1.  A subset of the polytypic B-cells expresses CD10. T-cells show a CD4:CD8 ratio of 3.3:1 without immunophenotypic aberrancy with the markers evaluated.  Although these results do not support the diagnosis of a clonal lymphoid population, sampling issues must always be considered when negative results are obtained, as focal lesions may not be represented in the specimen submitted.  In addition, flow cytometric immunophenotyping will not exclude other pathology if present (e.g. Hodgkin lymphoma, some T-cell lymphomas, metastatic and infectious diseases).   11/11/2020 Pathology Results   FINAL MICROSCOPIC DIAGNOSIS:   A. LYMPH NODE, LEFT AXILLARY, DISSECTION:  -  Follicular hyperplasia with interfollicular expansion  -  See comment   COMMENT:  Sections of the lymph nodes reveal generally preserved lymph node architecture.  There is  follicular hyperplasia with some follicles showing increased hyalinization of germinal centers and mild concentric encircling of germinal centers with small  lymphocytes (onion skinning). Interfollicular areas are expanded with increased vascular proliferation, small lymphocytes, plasma cells, histiocytic cells, and patchy neutrophils.  The lymph node capsule is variably thickened by fibrosis with few plasma cells.   A panel of immunohistochemical stains is performed for further  characterization.  CD3 and CD20/PAX5 highlight T-cell and B-cell  compartments, respectively.  Germinal centers are highlighted by CD10 and BCL6 with appropriate absent expression of BCL2.  CD5 is similar to CD3.  CD43 also stains the T-cells.  CD4-positive T-cells exceed CD8-positive T-cells.  CD30 highlights scattered immunoblasts.  CD21 reveals intact follicular dendritic cell meshworks.  CD68 highlights increased histiocytic cells.  HHV 8 is negative.  T. pallidum reveals no definitive organisms.  Pancytokeratin is negative.  TdT stains rare cells.  CD138 highlights plasma cells which are not increased in number and show polytypic light chain expression by kappa/lambda in situ  hybridization.  EBV is negative by in situ hybridization.   Concurrent flow cytometric analysis is negative for a monoclonal B-cell or immunophenotypically aberrant T-cell population (see [phone]).   Together, the findings above are non-specific and can be seen in  reactive and infectious processes as well as [place]man disease.  There is no definitive morphologic or flow cytometric evidence of involvement by a lymphoproliferative disorder from the current workup.   12/17/2020 Initial Diagnosis   [name] (chronic myelomonocytic leukemia) ([place])   12/28/2020 -  Chemotherapy   Patient is on Treatment Plan : MYELODYSPLASIA  Azacitidine IV D1-5 q28d     03/18/2021 Pathology Results   Final Diagnosis    BONE MARROW:             Persistent chronic myelomonocytic leukemia in a hypercellular bone marrow with increased monocytes and no increase in blasts.   Comment    Flow cytometric analysis showed no  increase in blasts and 17% monocytes.A next generation myeloid panel showed the presence of the following mutations: NRAS, SH2B3, PHF6 and TET2. CD34 and CD117 immunstains do not show an increase in blasts. The findings are consistent with persistent [name] with a decrease in the number of blasts compared to that seen on the previous bone marrow.    Final Interpretation      BONE MARROW; FLOW CYTOMETRIC ANALYSIS:   No increased blasts identified. (see comment)  Monocytosis (17% of total events).    COMMENT: Flow cytometry identified about 0.6% of total events as immature cells. These cells express CD45 (dim), CD34, CD117, HLA-DR, CD38, CD33 (variable). This immunophenotype is consistent with normal myeloblasts. In addition, monocytes are increased and comprise of approximately 17% of total events. These monocytes show normal expression of CD33/CD64/CD14/CD11b with variable expression of CD4. Correlation with concurrent morphology and clinical data is recommended (see [id number]).    FLOW CYTOMETRY ANALYSIS: CD45 versus side scatter analysis demonstrate a predominance of granulocytes (~70%), monocytes (~17%) and lymphocytes (~6%). No significant increase in blasts identified. All tested markers were used for adequate analysis of the cells and were appropriately reviewed.        INTERVAL HISTORY:  SHANEQUE MERKLE is here for a follow up of [name], severe thrombocytopenia. She was last seen by me on 05/24/21. She presents to the clinic alone. She reports her back pain is still present s/p kyphoplasty but states "it's nothing like it was." She is no longer wearing a brace.   All other systems were reviewed  with the patient and are negative.  MEDICAL HISTORY:  Past Medical History:  Diagnosis Date   Diabetes ([place])    HLD (hyperlipidemia)    Hypertension    Pernicious anemia    Pneumonia 09/05/2019   Renal insufficiency 09/06/2019   Vitamin D deficiency     SURGICAL HISTORY: Past Surgical  History:  Procedure Laterality Date   ABDOMINAL HERNIA REPAIR  2009   ABDOMINOPLASTY     AXILLARY LYMPH NODE BIOPSY Left 11/11/2020   Procedure: LEFT AXILLARY EXCISIONAL LYMPH NODE BIOPSY;  Surgeon: [name], MD;  Location: [facility];  Service: General;  Laterality: Left;   [place]   GASTRIC BYPASS  2000   hemorroid surgery  2007   IR KYPHO EA ADDL LEVEL THORACIC OR LUMBAR  05/31/2021   IR KYPHO THORACIC WITH BONE BIOPSY  05/31/2021   TONSILLECTOMY AND ADENOIDECTOMY  1957    I have reviewed the social history and family history with the patient and they are unchanged from previous note.  ALLERGIES:  is allergic to asa [aspirin] and nsaids.  MEDICATIONS:  Current Outpatient Medications  Medication Sig Dispense Refill   acetaminophen (TYLENOL) 500 MG tablet Take 2 tablets (1,000 mg total) by mouth every 8 (eight) hours. 30 tablet 0   ammonium lactate (LAC-HYDRIN) 12 % lotion Apply 1 application topically 2 (two) times daily as needed for dry skin.     bisacodyl (DULCOLAX) 10 MG suppository Place 1 suppository (10 mg total) rectally daily as needed for moderate constipation (Please take-if no bowel movement in 1-2 days with MiraLAX/senna). 12 suppository 0   cyanocobalamin (,VITAMIN B-12,) 1000 MCG/ML injection Inject 1,000 mcg into the muscle every 30 (thirty) days.     gabapentin (NEURONTIN) 300 MG capsule Take 2 capsules (600 mg total) by mouth 2 (two) times daily. 360 capsule 1   LORazepam (ATIVAN) 1 MG tablet TAKE 1 TABLET BY MOUTH EVERY 8 HOURS AS NEEDED FOR ANXIETY 30 tablet 0   metoprolol succinate (TOPROL-XL) 25 MG 24 hr tablet Take 1 tablet (25 mg total) by mouth daily. 90 tablet 1   naloxone (NARCAN) nasal spray 4 mg/0.1 mL Place 1 spray into the nose once as needed (overdose). 2 each 0   ondansetron (ZOFRAN-ODT) 4 MG disintegrating tablet Take 4 mg by mouth every 8 (eight) hours as needed for nausea or vomiting.     Oxycodone HCl 10 MG TABS 1 tab every 4-6 hr prn  for chronic pain (Patient taking differently: Take 10 mg by mouth every 4 (four) hours as needed (chronic pain). 1 tab every 4-6 hr prn for chronic pain) 120 tablet 0   PARoxetine (PAXIL) 10 MG tablet Take 10 mg by mouth every morning.     polyethylene glycol powder (GLYCOLAX/MIRALAX) 17 GM/SCOOP powder Dissolve 1 capful (17 g) in water and drink 2 (two) times daily. (Patient taking differently: Take 17 g by mouth daily.) 510 g 2   senna-docusate (SENOKOT-S) 8.6-50 MG tablet Take 2 tablets by mouth at bedtime. (Patient taking differently: Take 2 tablets by mouth at bedtime as needed for mild constipation.) 60 tablet 2   sitaGLIPtin-metformin (JANUMET) 50-1000 MG tablet Take 1 tablet by mouth at bedtime. 90 tablet 1   No current facility-administered medications for this visit.   Facility-Administered Medications Ordered in Other Visits  Medication Dose Route Frequency Provider Last Rate Last Admin   0.9 %  sodium chloride infusion (Manually program via Guardrails IV Fluids)  250 mL Intravenous Once Truitt Merle,  MD       azaCITIDine (VIDAZA) 135 mg in sodium chloride 0.9 % 50 mL chemo infusion  75 mg/m2 (Treatment Plan Recorded) Intravenous Once Truitt Merle, MD 254 mL/hr at 06/21/21 1043 135 mg at 06/21/21 1043   heparin lock flush 100 unit/mL  250 Units Intracatheter Once PRN Truitt Merle, MD       sodium chloride flush (NS) 0.9 % injection 10 mL  10 mL Intracatheter PRN Truitt Merle, MD        PHYSICAL EXAMINATION: ECOG PERFORMANCE STATUS: 2 - Symptomatic, <50% confined to bed  Vitals:   06/21/21 0854  BP: 120/69  Pulse: (!) 102  Resp: 17  Temp: 98 F (36.7 C)  SpO2: 98%   Wt Readings from Last 3 Encounters:  06/21/21 139 lb 9.6 oz (63.3 kg)  06/11/21 138 lb 8 oz (62.8 kg)  05/31/21 147 lb (66.7 kg)     GENERAL:alert, no distress and comfortable SKIN: skin color normal, no rashes or significant lesions EYES: normal, Conjunctiva are pink and non-injected, sclera clear  NEURO: alert &  oriented x 3 with fluent speech  LABORATORY DATA:  I have reviewed the data as listed CBC Latest Ref Rng & Units 06/21/2021 06/15/2021 06/11/2021  WBC 4.0 - 10.5 K/uL 4.2 6.7 8.4  Hemoglobin 12.0 - 15.0 g/dL 8.2(L) 8.7(L) 9.1(L)  Hematocrit 36.0 - 46.0 % 26.6(L) 27.9(L) 28.7(L)  Platelets 150 - 400 K/uL 17(L) 8(LL) 7(LL)     CMP Latest Ref Rng & Units 06/21/2021 06/15/2021 06/11/2021  Glucose 70 - 99 mg/dL 216(H) 195(H) 232(H)  BUN 8 - 23 mg/dL $Remove'17 17 18  'NbiiQlA$ Creatinine 0.44 - 1.00 mg/dL 0.68 0.67 0.68  Sodium 135 - 145 mmol/L 139 140 139  Potassium 3.5 - 5.1 mmol/L 4.0 4.1 4.4  Chloride 98 - 111 mmol/L 107 107 106  CO2 22 - 32 mmol/L $RemoveB'26 26 27  'ZMHfnlcF$ Calcium 8.9 - 10.3 mg/dL 8.6(L) 8.6(L) 8.7(L)  Total Protein 6.5 - 8.1 g/dL 6.6 6.8 6.7  Total Bilirubin 0.3 - 1.2 mg/dL 0.9 0.7 0.7  Alkaline Phos 38 - 126 U/L 111 124 120  AST 15 - 41 U/L 13(L) 17 21  ALT 0 - 44 U/L $Remo'12 15 22      'jhLzQ$ RADIOGRAPHIC STUDIES: I have personally reviewed the radiological images as listed and agreed with the findings in the report. No results found.    No orders of the defined types were placed in this encounter.  All questions were answered. The patient knows to call the clinic with any problems, questions or concerns. No barriers to learning was detected. The total time spent in the appointment was 30 minutes.     Truitt Merle, MD 06/21/2021   I, Wilburn Mylar, am acting as scribe for Truitt Merle, MD.   I have reviewed the above documentation for accuracy and completeness, and I agree with the above.

## 2021-06-21 NOTE — Patient Instructions (Signed)
[facility] ONCOLOG[facility]  Discharge Instructions: Thank you for choosing [facility] to provide your oncology and hematology care.   If you have a lab appointment with the [facility], please go directly to the [facility] and check in at the registration area.   Wear comfortable clothing and clothing appropriate for easy access to any Portacath or PICC line.   We strive to give you quality time with your provider. You may need to reschedule your appointment if you arrive late (15 or more minutes).  Arriving late affects you and other patients whose appointments are after yours.  Also, if you miss three or more appointments without notifying the office, you may be dismissed from the clinic at the providers discretion.      For prescription refill requests, have your pharmacy contact our office and allow 72 hours for refills to be completed.    Today you received the following chemotherapy and/or immunotherapy agents: Azacitidine.       To help prevent nausea and vomiting after your treatment, we encourage you to take your nausea medication as directed.  BELOW ARE SYMPTOMS THAT SHOULD BE REPORTED IMMEDIATELY: *FEVER GREATER THAN 100.4 F (38 C) OR HIGHER *CHILLS OR SWEATING *NAUSEA AND VOMITING THAT IS NOT CONTROLLED WITH YOUR NAUSEA MEDICATION *UNUSUAL SHORTNESS OF BREATH *UNUSUAL BRUISING OR BLEEDING *URINARY PROBLEMS (pain or burning when urinating, or frequent urination) *BOWEL PROBLEMS (unusual diarrhea, constipation, pain near the anus) TENDERNESS IN MOUTH AND THROAT WITH OR WITHOUT PRESENCE OF ULCERS (sore throat, sores in mouth, or a toothache) UNUSUAL RASH, SWELLING OR PAIN  UNUSUAL VAGINAL DISCHARGE OR ITCHING   Items with * indicate a potential emergency and should be followed up as soon as possible or go to the Emergency Department if any problems should occur.  Please show the CHEMOTHERAPY ALERT CARD or IMMUNOTHERAPY ALERT CARD at check-in  to the Emergency Department and triage nurse.  Should you have questions after your visit or need to cancel or reschedule your appointment, please contact [facility]  Dept: [phone]  and follow the prompts.  Office hours are 8:00 a.m. to 4:30 p.m. Monday - Friday. Please note that voicemails left after 4:00 p.m. may not be returned until the following business day.  We are closed weekends and major holidays. You have access to a nurse at all times for urgent questions. Please call the main number to the clinic Dept: [phone] and follow the prompts.   For any non-urgent questions, you may also contact your provider using MyChart. We now offer e-Visits for anyone 30 and older to request care online for non-urgent symptoms. For details visit [email or web address].   Also download the MyChart app! Go to the app store, search "MyChart", open the app, select [facility], and log in with your MyChart username and password.  Due to Covid, a mask is required upon entering the hospital/clinic. If you do not have a mask, one will be given to you upon arrival. For doctor visits, patients may have 1 support person aged 70 or older with them. For treatment visits, patients cannot have anyone with them due to current Covid guidelines and our immunocompromised population.

## 2021-06-22 ENCOUNTER — Telehealth: Payer: Self-pay | Admitting: Hematology

## 2021-06-22 ENCOUNTER — Inpatient Hospital Stay: Payer: Medicare HMO

## 2021-06-22 VITALS — BP 127/63 | HR 90 | Temp 97.9°F | Resp 16

## 2021-06-22 DIAGNOSIS — Z79899 Other long term (current) drug therapy: Secondary | ICD-10-CM | POA: Diagnosis not present

## 2021-06-22 DIAGNOSIS — D693 Immune thrombocytopenic purpura: Secondary | ICD-10-CM | POA: Diagnosis not present

## 2021-06-22 DIAGNOSIS — C9310 Chronic myelomonocytic leukemia not having achieved remission: Secondary | ICD-10-CM

## 2021-06-22 DIAGNOSIS — D510 Vitamin B12 deficiency anemia due to intrinsic factor deficiency: Secondary | ICD-10-CM | POA: Diagnosis not present

## 2021-06-22 DIAGNOSIS — I10 Essential (primary) hypertension: Secondary | ICD-10-CM | POA: Diagnosis not present

## 2021-06-22 DIAGNOSIS — R69 Illness, unspecified: Secondary | ICD-10-CM | POA: Diagnosis not present

## 2021-06-22 DIAGNOSIS — R519 Headache, unspecified: Secondary | ICD-10-CM | POA: Diagnosis not present

## 2021-06-22 DIAGNOSIS — Z5111 Encounter for antineoplastic chemotherapy: Secondary | ICD-10-CM | POA: Diagnosis not present

## 2021-06-22 DIAGNOSIS — E1142 Type 2 diabetes mellitus with diabetic polyneuropathy: Secondary | ICD-10-CM | POA: Diagnosis not present

## 2021-06-22 MED ORDER — SODIUM CHLORIDE 0.9% FLUSH
10.0000 mL | INTRAVENOUS | Status: DC | PRN
  Administered 2021-06-22: 10 mL

## 2021-06-22 MED ORDER — SODIUM CHLORIDE 0.9 % IV SOLN
10.0000 mg | Freq: Once | INTRAVENOUS | Status: AC
  Administered 2021-06-22: 10 mg via INTRAVENOUS
  Filled 2021-06-22: qty 10

## 2021-06-22 MED ORDER — SODIUM CHLORIDE 0.9 % IV SOLN
75.0000 mg/m2 | Freq: Once | INTRAVENOUS | Status: AC
  Administered 2021-06-22: 135 mg via INTRAVENOUS
  Filled 2021-06-22: qty 13.5

## 2021-06-22 MED ORDER — HEPARIN SOD (PORK) LOCK FLUSH 100 UNIT/ML IV SOLN
250.0000 [IU] | Freq: Once | INTRAVENOUS | Status: AC | PRN
  Administered 2021-06-22: 250 [IU]

## 2021-06-22 MED ORDER — SODIUM CHLORIDE 0.9 % IV SOLN: Freq: Once | INTRAVENOUS | Status: AC

## 2021-06-22 MED FILL — Dexamethasone Sodium Phosphate Inj 100 MG/10ML: INTRAMUSCULAR | Qty: 1 | Status: AC

## 2021-06-22 NOTE — Telephone Encounter (Signed)
Left message with follow-up appointments per 1/9 los. °

## 2021-06-22 NOTE — Patient Instructions (Signed)
[facility]COL[facility]   Discharge Instructions: Thank you for choosing [facility] to provide your oncology and hematology care.   If you have a lab appointment with the Cancer Center, please go directly to the Cancer Center and check in at the registration area.   Wear comfortable clothing and clothing appropriate for easy access to any Portacath or PICC line.   We strive to give you quality time with your provider. You may need to reschedule your appointment if you arrive late (15 or more minutes).  Arriving late affects you and other patients whose appointments are after yours.  Also, if you miss three or more appointments without notifying the office, you may be dismissed from the clinic at the provider's discretion.      For prescription refill requests, have your pharmacy contact our office and allow 72 hours for refills to be completed.    Today you received the following chemotherapy and/or immunotherapy agents: azacitadine      To help prevent nausea and vomiting after your treatment, we encourage you to take your nausea medication as directed.  BELOW ARE SYMPTOMS THAT SHOULD BE REPORTED IMMEDIATELY: *FEVER GREATER THAN 100.4 F (38 C) OR HIGHER *CHILLS OR SWEATING *NAUSEA AND VOMITING THAT IS NOT CONTROLLED WITH YOUR NAUSEA MEDICATION *UNUSUAL SHORTNESS OF BREATH *UNUSUAL BRUISING OR BLEEDING *URINARY PROBLEMS (pain or burning when urinating, or frequent urination) *BOWEL PROBLEMS (unusual diarrhea, constipation, pain near the anus) TENDERNESS IN MOUTH AND THROAT WITH OR WITHOUT PRESENCE OF ULCERS (sore throat, sores in mouth, or a toothache) UNUSUAL RASH, SWELLING OR PAIN  UNUSUAL VAGINAL DISCHARGE OR ITCHING   Items with * indicate a potential emergency and should be followed up as soon as possible or go to the Emergency Department if any problems should occur.  Please show the CHEMOTHERAPY ALERT CARD or IMMUNOTHERAPY ALERT CARD at check-in  to the Emergency Department and triage nurse.  Should you have questions after your visit or need to cancel or reschedule your appointment, please contact [facility]  Dept: [phone]  and follow the prompts.  Office hours are 8:00 a.m. to 4:30 p.m. Monday - Friday. Please note that voicemails left after 4:00 p.m. may not be returned until the following business day.  We are closed weekends and major holidays. You have access to a nurse at all times for urgent questions. Please call the main number to the clinic Dept: [phone] and follow the prompts.   For any non-urgent questions, you may also contact your provider using MyChart. We now offer e-Visits for anyone 18 and older to request care online for non-urgent symptoms. For details visit [email or web address].   Also download the MyChart app! Go to the app store, search "MyChart", open the app, select [facility], and log in with your MyChart username and password.  Due to Covid, a mask is required upon entering the hospital/clinic. If you do not have a mask, one will be given to you upon arrival. For doctor visits, patients may have 1 support person aged 18 or older with them. For treatment visits, patients cannot have anyone with them due to current Covid guidelines and our immunocompromised population.   

## 2021-06-23 ENCOUNTER — Inpatient Hospital Stay: Payer: Medicare HMO

## 2021-06-23 ENCOUNTER — Other Ambulatory Visit: Payer: Self-pay

## 2021-06-23 VITALS — BP 122/75 | HR 93 | Temp 97.8°F | Resp 16

## 2021-06-23 DIAGNOSIS — C9310 Chronic myelomonocytic leukemia not having achieved remission: Secondary | ICD-10-CM

## 2021-06-23 DIAGNOSIS — Z5111 Encounter for antineoplastic chemotherapy: Secondary | ICD-10-CM | POA: Diagnosis not present

## 2021-06-23 DIAGNOSIS — D510 Vitamin B12 deficiency anemia due to intrinsic factor deficiency: Secondary | ICD-10-CM | POA: Diagnosis not present

## 2021-06-23 DIAGNOSIS — D693 Immune thrombocytopenic purpura: Secondary | ICD-10-CM | POA: Diagnosis not present

## 2021-06-23 DIAGNOSIS — I10 Essential (primary) hypertension: Secondary | ICD-10-CM | POA: Diagnosis not present

## 2021-06-23 DIAGNOSIS — R69 Illness, unspecified: Secondary | ICD-10-CM | POA: Diagnosis not present

## 2021-06-23 DIAGNOSIS — R519 Headache, unspecified: Secondary | ICD-10-CM | POA: Diagnosis not present

## 2021-06-23 DIAGNOSIS — E1142 Type 2 diabetes mellitus with diabetic polyneuropathy: Secondary | ICD-10-CM | POA: Diagnosis not present

## 2021-06-23 DIAGNOSIS — Z79899 Other long term (current) drug therapy: Secondary | ICD-10-CM | POA: Diagnosis not present

## 2021-06-23 MED ORDER — SODIUM CHLORIDE 0.9 % IV SOLN: Freq: Once | INTRAVENOUS | Status: AC

## 2021-06-23 MED ORDER — SODIUM CHLORIDE 0.9% FLUSH
10.0000 mL | INTRAVENOUS | Status: DC | PRN
  Administered 2021-06-23: 10 mL

## 2021-06-23 MED ORDER — HEPARIN SOD (PORK) LOCK FLUSH 100 UNIT/ML IV SOLN
250.0000 [IU] | Freq: Once | INTRAVENOUS | Status: AC | PRN
  Administered 2021-06-23: 250 [IU]

## 2021-06-23 MED ORDER — SODIUM CHLORIDE 0.9 % IV SOLN
75.0000 mg/m2 | Freq: Once | INTRAVENOUS | Status: AC
  Administered 2021-06-23: 135 mg via INTRAVENOUS
  Filled 2021-06-23: qty 13.5

## 2021-06-23 MED ORDER — SODIUM CHLORIDE 0.9 % IV SOLN
10.0000 mg | Freq: Once | INTRAVENOUS | Status: AC
  Administered 2021-06-23: 10 mg via INTRAVENOUS
  Filled 2021-06-23: qty 10

## 2021-06-23 MED ORDER — PALONOSETRON HCL INJECTION 0.25 MG/5ML
0.2500 mg | Freq: Once | INTRAVENOUS | Status: AC
  Administered 2021-06-23: 0.25 mg via INTRAVENOUS
  Filled 2021-06-23: qty 5

## 2021-06-23 MED FILL — Dexamethasone Sodium Phosphate Inj 100 MG/10ML: INTRAMUSCULAR | Qty: 1 | Status: AC

## 2021-06-23 NOTE — Patient Instructions (Signed)
[facility]COL[facility]   Discharge Instructions: Thank you for choosing [facility] to provide your oncology and hematology care.   If you have a lab appointment with the Cancer Center, please go directly to the Cancer Center and check in at the registration area.   Wear comfortable clothing and clothing appropriate for easy access to any Portacath or PICC line.   We strive to give you quality time with your provider. You may need to reschedule your appointment if you arrive late (15 or more minutes).  Arriving late affects you and other patients whose appointments are after yours.  Also, if you miss three or more appointments without notifying the office, you may be dismissed from the clinic at the provider's discretion.      For prescription refill requests, have your pharmacy contact our office and allow 72 hours for refills to be completed.    Today you received the following chemotherapy and/or immunotherapy agents: azacitadine      To help prevent nausea and vomiting after your treatment, we encourage you to take your nausea medication as directed.  BELOW ARE SYMPTOMS THAT SHOULD BE REPORTED IMMEDIATELY: *FEVER GREATER THAN 100.4 F (38 C) OR HIGHER *CHILLS OR SWEATING *NAUSEA AND VOMITING THAT IS NOT CONTROLLED WITH YOUR NAUSEA MEDICATION *UNUSUAL SHORTNESS OF BREATH *UNUSUAL BRUISING OR BLEEDING *URINARY PROBLEMS (pain or burning when urinating, or frequent urination) *BOWEL PROBLEMS (unusual diarrhea, constipation, pain near the anus) TENDERNESS IN MOUTH AND THROAT WITH OR WITHOUT PRESENCE OF ULCERS (sore throat, sores in mouth, or a toothache) UNUSUAL RASH, SWELLING OR PAIN  UNUSUAL VAGINAL DISCHARGE OR ITCHING   Items with * indicate a potential emergency and should be followed up as soon as possible or go to the Emergency Department if any problems should occur.  Please show the CHEMOTHERAPY ALERT CARD or IMMUNOTHERAPY ALERT CARD at check-in  to the Emergency Department and triage nurse.  Should you have questions after your visit or need to cancel or reschedule your appointment, please contact [facility]  Dept: [phone]  and follow the prompts.  Office hours are 8:00 a.m. to 4:30 p.m. Monday - Friday. Please note that voicemails left after 4:00 p.m. may not be returned until the following business day.  We are closed weekends and major holidays. You have access to a nurse at all times for urgent questions. Please call the main number to the clinic Dept: [phone] and follow the prompts.   For any non-urgent questions, you may also contact your provider using MyChart. We now offer e-Visits for anyone 18 and older to request care online for non-urgent symptoms. For details visit [email or web address].   Also download the MyChart app! Go to the app store, search "MyChart", open the app, select [facility], and log in with your MyChart username and password.  Due to Covid, a mask is required upon entering the hospital/clinic. If you do not have a mask, one will be given to you upon arrival. For doctor visits, patients may have 1 support person aged 18 or older with them. For treatment visits, patients cannot have anyone with them due to current Covid guidelines and our immunocompromised population.   

## 2021-06-24 ENCOUNTER — Inpatient Hospital Stay: Payer: Medicare HMO

## 2021-06-24 ENCOUNTER — Other Ambulatory Visit: Payer: Self-pay

## 2021-06-24 VITALS — BP 108/62 | HR 92 | Temp 98.2°F | Resp 18

## 2021-06-24 DIAGNOSIS — C9310 Chronic myelomonocytic leukemia not having achieved remission: Secondary | ICD-10-CM

## 2021-06-24 DIAGNOSIS — R69 Illness, unspecified: Secondary | ICD-10-CM | POA: Diagnosis not present

## 2021-06-24 DIAGNOSIS — Z79899 Other long term (current) drug therapy: Secondary | ICD-10-CM | POA: Diagnosis not present

## 2021-06-24 DIAGNOSIS — D510 Vitamin B12 deficiency anemia due to intrinsic factor deficiency: Secondary | ICD-10-CM | POA: Diagnosis not present

## 2021-06-24 DIAGNOSIS — Z95828 Presence of other vascular implants and grafts: Secondary | ICD-10-CM

## 2021-06-24 DIAGNOSIS — E1142 Type 2 diabetes mellitus with diabetic polyneuropathy: Secondary | ICD-10-CM | POA: Diagnosis not present

## 2021-06-24 DIAGNOSIS — D693 Immune thrombocytopenic purpura: Secondary | ICD-10-CM | POA: Diagnosis not present

## 2021-06-24 DIAGNOSIS — Z5111 Encounter for antineoplastic chemotherapy: Secondary | ICD-10-CM | POA: Diagnosis not present

## 2021-06-24 DIAGNOSIS — R519 Headache, unspecified: Secondary | ICD-10-CM | POA: Diagnosis not present

## 2021-06-24 DIAGNOSIS — D696 Thrombocytopenia, unspecified: Secondary | ICD-10-CM

## 2021-06-24 DIAGNOSIS — I10 Essential (primary) hypertension: Secondary | ICD-10-CM | POA: Diagnosis not present

## 2021-06-24 LAB — CBC WITH DIFFERENTIAL (CANCER CENTER ONLY)
Abs Immature Granulocytes: 0.04 10*3/uL (ref 0.00–0.07)
Basophils Absolute: 0.1 10*3/uL (ref 0.0–0.1)
Basophils Relative: 1 %
Eosinophils Absolute: 0 10*3/uL (ref 0.0–0.5)
Eosinophils Relative: 0 %
HCT: 27 % — ABNORMAL LOW (ref 36.0–46.0)
Hemoglobin: 8.3 g/dL — ABNORMAL LOW (ref 12.0–15.0)
Immature Granulocytes: 1 %
Lymphocytes Relative: 17 %
Lymphs Abs: 0.8 10*3/uL (ref 0.7–4.0)
MCH: 25.9 pg — ABNORMAL LOW (ref 26.0–34.0)
MCHC: 30.7 g/dL (ref 30.0–36.0)
MCV: 84.4 fL (ref 80.0–100.0)
Monocytes Absolute: 0.9 10*3/uL (ref 0.1–1.0)
Monocytes Relative: 18 %
Neutro Abs: 3.1 10*3/uL (ref 1.7–7.7)
Neutrophils Relative %: 63 %
Platelet Count: 15 10*3/uL — ABNORMAL LOW (ref 150–400)
RBC: 3.2 MIL/uL — ABNORMAL LOW (ref 3.87–5.11)
RDW: 19.9 % — ABNORMAL HIGH (ref 11.5–15.5)
WBC Count: 4.9 10*3/uL (ref 4.0–10.5)
nRBC: 0 % (ref 0.0–0.2)

## 2021-06-24 LAB — CMP (CANCER CENTER ONLY)
ALT: 16 U/L (ref 0–44)
AST: 13 U/L — ABNORMAL LOW (ref 15–41)
Albumin: 3.6 g/dL (ref 3.5–5.0)
Alkaline Phosphatase: 109 U/L (ref 38–126)
Anion gap: 6 (ref 5–15)
BUN: 19 mg/dL (ref 8–23)
CO2: 27 mmol/L (ref 22–32)
Calcium: 9.1 mg/dL (ref 8.9–10.3)
Chloride: 106 mmol/L (ref 98–111)
Creatinine: 0.73 mg/dL (ref 0.44–1.00)
GFR, Estimated: >60 mL/min (ref >60)
Glucose, Bld: 186 mg/dL — ABNORMAL HIGH (ref 70–99)
Potassium: 3.7 mmol/L (ref 3.5–5.1)
Sodium: 139 mmol/L (ref 135–145)
Total Bilirubin: 0.6 mg/dL (ref 0.3–1.2)
Total Protein: 6.8 g/dL (ref 6.5–8.1)

## 2021-06-24 LAB — SAMPLE TO BLOOD BANK

## 2021-06-24 MED ORDER — SODIUM CHLORIDE 0.9% FLUSH
10.0000 mL | INTRAVENOUS | Status: DC | PRN
  Administered 2021-06-24: 10 mL

## 2021-06-24 MED ORDER — HEPARIN SOD (PORK) LOCK FLUSH 100 UNIT/ML IV SOLN
250.0000 [IU] | Freq: Once | INTRAVENOUS | Status: AC | PRN
  Administered 2021-06-24: 250 [IU]

## 2021-06-24 MED ORDER — SODIUM CHLORIDE 0.9 % IV SOLN
75.0000 mg/m2 | Freq: Once | INTRAVENOUS | Status: AC
  Administered 2021-06-24: 135 mg via INTRAVENOUS
  Filled 2021-06-24: qty 13.5

## 2021-06-24 MED ORDER — SODIUM CHLORIDE 0.9% IV SOLUTION
250.0000 mL | Freq: Once | INTRAVENOUS | Status: AC
  Administered 2021-06-24: 250 mL via INTRAVENOUS

## 2021-06-24 MED ORDER — SODIUM CHLORIDE 0.9 % IV SOLN
10.0000 mg | Freq: Once | INTRAVENOUS | Status: AC
  Administered 2021-06-24: 10 mg via INTRAVENOUS
  Filled 2021-06-24: qty 10

## 2021-06-24 MED ORDER — SODIUM CHLORIDE 0.9% FLUSH
10.0000 mL | INTRAVENOUS | Status: AC | PRN
  Administered 2021-06-24: 10 mL

## 2021-06-24 MED ORDER — SODIUM CHLORIDE 0.9 % IV SOLN: Freq: Once | INTRAVENOUS | Status: AC

## 2021-06-24 MED FILL — Dexamethasone Sodium Phosphate Inj 100 MG/10ML: INTRAMUSCULAR | Qty: 1 | Status: AC

## 2021-06-24 NOTE — Patient Instructions (Addendum)
[facility] ONCOLOG[facility]  Discharge Instructions: Thank you for choosing [facility] to provide your oncology and hematology care.   If you have a lab appointment with the [facility], please go directly to the [facility] and check in at the registration area.   Wear comfortable clothing and clothing appropriate for easy access to any Portacath or PICC line.   We strive to give you quality time with your provider. You may need to reschedule your appointment if you arrive late (15 or more minutes).  Arriving late affects you and other patients whose appointments are after yours.  Also, if you miss three or more appointments without notifying the office, you may be dismissed from the clinic at the providers discretion.      For prescription refill requests, have your pharmacy contact our office and allow 72 hours for refills to be completed.    Today you received the following chemotherapy and/or immunotherapy agents Vidaza      To help prevent nausea and vomiting after your treatment, we encourage you to take your nausea medication as directed.  BELOW ARE SYMPTOMS THAT SHOULD BE REPORTED IMMEDIATELY: *FEVER GREATER THAN 100.4 F (38 C) OR HIGHER *CHILLS OR SWEATING *NAUSEA AND VOMITING THAT IS NOT CONTROLLED WITH YOUR NAUSEA MEDICATION *UNUSUAL SHORTNESS OF BREATH *UNUSUAL BRUISING OR BLEEDING *URINARY PROBLEMS (pain or burning when urinating, or frequent urination) *BOWEL PROBLEMS (unusual diarrhea, constipation, pain near the anus) TENDERNESS IN MOUTH AND THROAT WITH OR WITHOUT PRESENCE OF ULCERS (sore throat, sores in mouth, or a toothache) UNUSUAL RASH, SWELLING OR PAIN  UNUSUAL VAGINAL DISCHARGE OR ITCHING   Items with * indicate a potential emergency and should be followed up as soon as possible or go to the Emergency Department if any problems should occur.  Please show the CHEMOTHERAPY ALERT CARD or IMMUNOTHERAPY ALERT CARD at check-in to the  Emergency Department and triage nurse.  Should you have questions after your visit or need to cancel or reschedule your appointment, please contact [facility]  Dept: [phone]  and follow the prompts.  Office hours are 8:00 a.m. to 4:30 p.m. Monday - Friday. Please note that voicemails left after 4:00 p.m. may not be returned until the following business day.  We are closed weekends and major holidays. You have access to a nurse at all times for urgent questions. Please call the main number to the clinic Dept: [phone] and follow the prompts.   For any non-urgent questions, you may also contact your provider using MyChart. We now offer e-Visits for anyone 25 and older to request care online for non-urgent symptoms. For details visit [email or web address].   Also download the MyChart app! Go to the app store, search "MyChart", open the app, select [facility], and log in with your MyChart username and password.  Due to Covid, a mask is required upon entering the hospital/clinic. If you do not have a mask, one will be given to you upon arrival. For doctor visits, patients may have 1 support person aged 58 or older with them. For treatment visits, patients cannot have anyone with them due to current Covid guidelines and our immunocompromised population.    Blood Transfusion, Adult, Care After This sheet gives you information about how to care for yourself after your procedure. Your health care provider may also give you more specific instructions. If you have problems or questions, contact your health care provider. What can I expect after the procedure?  After the procedure, it is common to have: Bruising and soreness where the IV was inserted. A headache. Follow these instructions at home: IV insertion site care   Follow instructions from your health care provider about how to take care of your IV insertion site. Make sure you: Wash your hands with soap and  water before and after you change your bandage (dressing). If soap and water are not available, use hand sanitizer. Change your dressing as told by your health care provider. Check your IV insertion site every day for signs of infection. Check for: Redness, swelling, or pain. Bleeding from the site. Warmth. Pus or a bad smell. General instructions Take over-the-counter and prescription medicines only as told by your health care provider. Rest as told by your health care provider. Return to your normal activities as told by your health care provider. Keep all follow-up visits as told by your health care provider. This is important. Contact a health care provider if: You have itching or red, swollen areas of skin (hives). You feel anxious. You feel weak after doing your normal activities. You have redness, swelling, warmth, or pain around the IV insertion site. You have blood coming from the IV insertion site that does not stop with pressure. You have pus or a bad smell coming from your IV insertion site. Get help right away if: You have symptoms of a serious allergic or immune system reaction, including: Trouble breathing or shortness of breath. Swelling of the face or feeling flushed. Fever or chills. Pain in the head, back, or chest. Dark urine or blood in the urine. Widespread rash. Fast heartbeat. Feeling dizzy or light-headed. If you receive your blood transfusion in an outpatient setting, you will be told whom to contact to report any reactions. These symptoms may represent a serious problem that is an emergency. Do not wait to see if the symptoms will go away. Get medical help right away. Call your local emergency services (911 in the [place] Do not drive yourself to the hospital. Summary Bruising and tenderness around the IV insertion site are common. Check your IV insertion site every day for signs of infection. Rest as told by your health care provider. Return to your normal  activities as told by your health care provider. Get help right away for symptoms of a serious allergic or immune system reaction to blood transfusion. This information is not intended to replace advice given to you by your health care provider. Make sure you discuss any questions you have with your health care provider. Document Revised: 09/24/2020 Document Reviewed: 11/22/2018 Elsevier Patient Education  [place].

## 2021-06-24 NOTE — Progress Notes (Signed)
Per MD Burr Medico, pt to receive 1 unit of platelets today

## 2021-06-24 NOTE — Progress Notes (Signed)
Beth in blood bank confirmed order for plts.

## 2021-06-25 ENCOUNTER — Other Ambulatory Visit: Payer: Self-pay

## 2021-06-25 ENCOUNTER — Inpatient Hospital Stay: Payer: Medicare HMO

## 2021-06-25 VITALS — BP 126/66 | HR 95 | Temp 97.8°F | Resp 17

## 2021-06-25 DIAGNOSIS — R69 Illness, unspecified: Secondary | ICD-10-CM | POA: Diagnosis not present

## 2021-06-25 DIAGNOSIS — R519 Headache, unspecified: Secondary | ICD-10-CM | POA: Diagnosis not present

## 2021-06-25 DIAGNOSIS — D510 Vitamin B12 deficiency anemia due to intrinsic factor deficiency: Secondary | ICD-10-CM | POA: Diagnosis not present

## 2021-06-25 DIAGNOSIS — E1142 Type 2 diabetes mellitus with diabetic polyneuropathy: Secondary | ICD-10-CM | POA: Diagnosis not present

## 2021-06-25 DIAGNOSIS — I10 Essential (primary) hypertension: Secondary | ICD-10-CM | POA: Diagnosis not present

## 2021-06-25 DIAGNOSIS — D693 Immune thrombocytopenic purpura: Secondary | ICD-10-CM | POA: Diagnosis not present

## 2021-06-25 DIAGNOSIS — Z79899 Other long term (current) drug therapy: Secondary | ICD-10-CM | POA: Diagnosis not present

## 2021-06-25 DIAGNOSIS — Z5111 Encounter for antineoplastic chemotherapy: Secondary | ICD-10-CM | POA: Diagnosis not present

## 2021-06-25 DIAGNOSIS — C9310 Chronic myelomonocytic leukemia not having achieved remission: Secondary | ICD-10-CM | POA: Diagnosis not present

## 2021-06-25 LAB — PREPARE PLATELET PHERESIS: Unit division: 0

## 2021-06-25 LAB — BPAM PLATELET PHERESIS
Blood Product Expiration Date: 202301142359
ISSUE DATE / TIME: 202301121038
Unit Type and Rh: 5100

## 2021-06-25 MED ORDER — SODIUM CHLORIDE 0.9 % IV SOLN
10.0000 mg | Freq: Once | INTRAVENOUS | Status: AC
  Administered 2021-06-25: 10 mg via INTRAVENOUS
  Filled 2021-06-25: qty 10

## 2021-06-25 MED ORDER — SODIUM CHLORIDE 0.9% FLUSH
3.0000 mL | INTRAVENOUS | Status: DC | PRN
  Administered 2021-06-25: 3 mL

## 2021-06-25 MED ORDER — PALONOSETRON HCL INJECTION 0.25 MG/5ML
0.2500 mg | Freq: Once | INTRAVENOUS | Status: AC
  Administered 2021-06-25: 0.25 mg via INTRAVENOUS
  Filled 2021-06-25: qty 5

## 2021-06-25 MED ORDER — HEPARIN SOD (PORK) LOCK FLUSH 100 UNIT/ML IV SOLN
250.0000 [IU] | Freq: Once | INTRAVENOUS | Status: AC | PRN
  Administered 2021-06-25: 250 [IU]

## 2021-06-25 MED ORDER — SODIUM CHLORIDE 0.9 % IV SOLN
75.0000 mg/m2 | Freq: Once | INTRAVENOUS | Status: AC
  Administered 2021-06-25: 135 mg via INTRAVENOUS
  Filled 2021-06-25: qty 13.5

## 2021-06-25 MED ORDER — SODIUM CHLORIDE 0.9 % IV SOLN: Freq: Once | INTRAVENOUS | Status: AC

## 2021-06-28 ENCOUNTER — Inpatient Hospital Stay: Payer: Medicare HMO

## 2021-06-28 ENCOUNTER — Other Ambulatory Visit: Payer: Self-pay

## 2021-06-28 DIAGNOSIS — Z5111 Encounter for antineoplastic chemotherapy: Secondary | ICD-10-CM | POA: Diagnosis not present

## 2021-06-28 DIAGNOSIS — I10 Essential (primary) hypertension: Secondary | ICD-10-CM | POA: Diagnosis not present

## 2021-06-28 DIAGNOSIS — R519 Headache, unspecified: Secondary | ICD-10-CM | POA: Diagnosis not present

## 2021-06-28 DIAGNOSIS — C9310 Chronic myelomonocytic leukemia not having achieved remission: Secondary | ICD-10-CM

## 2021-06-28 DIAGNOSIS — D696 Thrombocytopenia, unspecified: Secondary | ICD-10-CM

## 2021-06-28 DIAGNOSIS — D693 Immune thrombocytopenic purpura: Secondary | ICD-10-CM | POA: Diagnosis not present

## 2021-06-28 DIAGNOSIS — Z79899 Other long term (current) drug therapy: Secondary | ICD-10-CM | POA: Diagnosis not present

## 2021-06-28 DIAGNOSIS — R69 Illness, unspecified: Secondary | ICD-10-CM | POA: Diagnosis not present

## 2021-06-28 DIAGNOSIS — D510 Vitamin B12 deficiency anemia due to intrinsic factor deficiency: Secondary | ICD-10-CM | POA: Diagnosis not present

## 2021-06-28 DIAGNOSIS — Z452 Encounter for adjustment and management of vascular access device: Secondary | ICD-10-CM

## 2021-06-28 DIAGNOSIS — E1142 Type 2 diabetes mellitus with diabetic polyneuropathy: Secondary | ICD-10-CM | POA: Diagnosis not present

## 2021-06-28 LAB — CBC WITH DIFFERENTIAL (CANCER CENTER ONLY)
Abs Immature Granulocytes: 0.05 10*3/uL (ref 0.00–0.07)
Basophils Absolute: 0.1 10*3/uL (ref 0.0–0.1)
Basophils Relative: 1 %
Eosinophils Absolute: 0 10*3/uL (ref 0.0–0.5)
Eosinophils Relative: 0 %
HCT: 29.6 % — ABNORMAL LOW (ref 36.0–46.0)
Hemoglobin: 9.2 g/dL — ABNORMAL LOW (ref 12.0–15.0)
Immature Granulocytes: 1 %
Lymphocytes Relative: 11 %
Lymphs Abs: 0.6 10*3/uL — ABNORMAL LOW (ref 0.7–4.0)
MCH: 25.8 pg — ABNORMAL LOW (ref 26.0–34.0)
MCHC: 31.1 g/dL (ref 30.0–36.0)
MCV: 83.1 fL (ref 80.0–100.0)
Monocytes Absolute: 0.5 10*3/uL (ref 0.1–1.0)
Monocytes Relative: 10 %
Neutro Abs: 3.8 10*3/uL (ref 1.7–7.7)
Neutrophils Relative %: 77 %
Platelet Count: 32 10*3/uL — ABNORMAL LOW (ref 150–400)
RBC: 3.56 MIL/uL — ABNORMAL LOW (ref 3.87–5.11)
RDW: 19.3 % — ABNORMAL HIGH (ref 11.5–15.5)
WBC Count: 5 10*3/uL (ref 4.0–10.5)
nRBC: 0 % (ref 0.0–0.2)

## 2021-06-28 LAB — CMP (CANCER CENTER ONLY)
ALT: 17 U/L (ref 0–44)
AST: 12 U/L — ABNORMAL LOW (ref 15–41)
Albumin: 3.6 g/dL (ref 3.5–5.0)
Alkaline Phosphatase: 105 U/L (ref 38–126)
Anion gap: 6 (ref 5–15)
BUN: 14 mg/dL (ref 8–23)
CO2: 25 mmol/L (ref 22–32)
Calcium: 8.5 mg/dL — ABNORMAL LOW (ref 8.9–10.3)
Chloride: 106 mmol/L (ref 98–111)
Creatinine: 0.66 mg/dL (ref 0.44–1.00)
GFR, Estimated: >60 mL/min (ref >60)
Glucose, Bld: 401 mg/dL — ABNORMAL HIGH (ref 70–99)
Potassium: 4 mmol/L (ref 3.5–5.1)
Sodium: 137 mmol/L (ref 135–145)
Total Bilirubin: 0.7 mg/dL (ref 0.3–1.2)
Total Protein: 7 g/dL (ref 6.5–8.1)

## 2021-06-28 LAB — SAMPLE TO BLOOD BANK

## 2021-06-28 MED ORDER — SODIUM CHLORIDE 0.9% FLUSH
10.0000 mL | INTRAVENOUS | Status: DC | PRN
  Administered 2021-06-28: 10 mL via INTRAVENOUS

## 2021-06-28 NOTE — Progress Notes (Signed)
Per MD parameters, pt does not need transfusion today. Pt made aware and agreeable, pt received print out copy of labs. Pt made aware of schedule later on this week. No further questions or needs at this time.

## 2021-06-29 ENCOUNTER — Telehealth: Payer: Self-pay

## 2021-06-29 DIAGNOSIS — C9310 Chronic myelomonocytic leukemia not having achieved remission: Secondary | ICD-10-CM

## 2021-06-29 NOTE — Telephone Encounter (Signed)
LVM asking pt does she want to keep appt for lab and infusion on 07/01/2021 per Dr. Ernestina Penna staff message from 06/29/2021.  Pt's labs looked great per Dr. Burr Medico and pt does not need blood products this week.  Awaiting response from Dr. Burr Medico.  Dr. Burr Medico would also like for the CMP to be drawn every 2 wks per staff message.  Modified order accordingly.

## 2021-06-30 ENCOUNTER — Encounter: Payer: Self-pay | Admitting: Hematology

## 2021-07-01 ENCOUNTER — Inpatient Hospital Stay: Payer: Medicare HMO

## 2021-07-01 ENCOUNTER — Other Ambulatory Visit: Payer: Self-pay

## 2021-07-01 VITALS — BP 111/63 | HR 83 | Temp 98.7°F | Resp 16

## 2021-07-01 DIAGNOSIS — D693 Immune thrombocytopenic purpura: Secondary | ICD-10-CM | POA: Diagnosis not present

## 2021-07-01 DIAGNOSIS — C9310 Chronic myelomonocytic leukemia not having achieved remission: Secondary | ICD-10-CM

## 2021-07-01 DIAGNOSIS — E1142 Type 2 diabetes mellitus with diabetic polyneuropathy: Secondary | ICD-10-CM | POA: Diagnosis not present

## 2021-07-01 DIAGNOSIS — D510 Vitamin B12 deficiency anemia due to intrinsic factor deficiency: Secondary | ICD-10-CM | POA: Diagnosis not present

## 2021-07-01 DIAGNOSIS — Z5111 Encounter for antineoplastic chemotherapy: Secondary | ICD-10-CM | POA: Diagnosis not present

## 2021-07-01 DIAGNOSIS — I10 Essential (primary) hypertension: Secondary | ICD-10-CM | POA: Diagnosis not present

## 2021-07-01 DIAGNOSIS — D696 Thrombocytopenia, unspecified: Secondary | ICD-10-CM

## 2021-07-01 DIAGNOSIS — R69 Illness, unspecified: Secondary | ICD-10-CM | POA: Diagnosis not present

## 2021-07-01 DIAGNOSIS — R519 Headache, unspecified: Secondary | ICD-10-CM | POA: Diagnosis not present

## 2021-07-01 DIAGNOSIS — Z79899 Other long term (current) drug therapy: Secondary | ICD-10-CM | POA: Diagnosis not present

## 2021-07-01 DIAGNOSIS — Z95828 Presence of other vascular implants and grafts: Secondary | ICD-10-CM

## 2021-07-01 LAB — CMP (CANCER CENTER ONLY)
ALT: 15 U/L (ref 0–44)
AST: 12 U/L — ABNORMAL LOW (ref 15–41)
Albumin: 3.6 g/dL (ref 3.5–5.0)
Alkaline Phosphatase: 95 U/L (ref 38–126)
Anion gap: 5 (ref 5–15)
BUN: 19 mg/dL (ref 8–23)
CO2: 27 mmol/L (ref 22–32)
Calcium: 8.8 mg/dL — ABNORMAL LOW (ref 8.9–10.3)
Chloride: 105 mmol/L (ref 98–111)
Creatinine: 0.72 mg/dL (ref 0.44–1.00)
GFR, Estimated: >60 mL/min (ref >60)
Glucose, Bld: 335 mg/dL — ABNORMAL HIGH (ref 70–99)
Potassium: 4.4 mmol/L (ref 3.5–5.1)
Sodium: 137 mmol/L (ref 135–145)
Total Bilirubin: 0.7 mg/dL (ref 0.3–1.2)
Total Protein: 6.8 g/dL (ref 6.5–8.1)

## 2021-07-01 LAB — CBC WITH DIFFERENTIAL (CANCER CENTER ONLY)
Abs Immature Granulocytes: 0.1 10*3/uL — ABNORMAL HIGH (ref 0.00–0.07)
Basophils Absolute: 0.1 10*3/uL (ref 0.0–0.1)
Basophils Relative: 1 %
Eosinophils Absolute: 0.1 10*3/uL (ref 0.0–0.5)
Eosinophils Relative: 1 %
HCT: 29.4 % — ABNORMAL LOW (ref 36.0–46.0)
Hemoglobin: 9 g/dL — ABNORMAL LOW (ref 12.0–15.0)
Immature Granulocytes: 2 %
Lymphocytes Relative: 11 %
Lymphs Abs: 0.7 10*3/uL (ref 0.7–4.0)
MCH: 25.8 pg — ABNORMAL LOW (ref 26.0–34.0)
MCHC: 30.6 g/dL (ref 30.0–36.0)
MCV: 84.2 fL (ref 80.0–100.0)
Monocytes Absolute: 0.5 10*3/uL (ref 0.1–1.0)
Monocytes Relative: 8 %
Neutro Abs: 4.9 10*3/uL (ref 1.7–7.7)
Neutrophils Relative %: 77 %
Platelet Count: 25 10*3/uL — ABNORMAL LOW (ref 150–400)
RBC: 3.49 MIL/uL — ABNORMAL LOW (ref 3.87–5.11)
RDW: 19.8 % — ABNORMAL HIGH (ref 11.5–15.5)
WBC Count: 6.3 10*3/uL (ref 4.0–10.5)
nRBC: 0 % (ref 0.0–0.2)

## 2021-07-01 LAB — SAMPLE TO BLOOD BANK

## 2021-07-01 MED ORDER — ROMIPLOSTIM INJECTION 500 MCG
10.0000 ug/kg | Freq: Once | SUBCUTANEOUS | Status: AC
  Administered 2021-07-01: 635 ug via SUBCUTANEOUS
  Filled 2021-07-01: qty 1

## 2021-07-01 MED ORDER — HEPARIN SOD (PORK) LOCK FLUSH 100 UNIT/ML IV SOLN
250.0000 [IU] | Freq: Once | INTRAVENOUS | Status: AC | PRN
  Administered 2021-07-01: 250 [IU]

## 2021-07-01 MED ORDER — SODIUM CHLORIDE 0.9% FLUSH
10.0000 mL | INTRAVENOUS | Status: AC | PRN
  Administered 2021-07-01: 10 mL

## 2021-07-01 MED ORDER — SODIUM CHLORIDE 0.9% FLUSH
10.0000 mL | Freq: Once | INTRAVENOUS | Status: AC | PRN
  Administered 2021-07-01: 10 mL

## 2021-07-01 NOTE — Patient Instructions (Signed)
Romiplostim injection ?What is this medication? ?ROMIPLOSTIM (roe mi PLOE stim) helps your body make more platelets. This medicine is used to treat low platelets caused by chronic idiopathic thrombocytopenic purpura (ITP) or a bone marrow syndrome caused by radiation sickness. ?This medicine may be used for other purposes; ask your health care provider or pharmacist if you have questions. ?COMMON BRAND NAME(S): Nplate ?What should I tell my care team before I take this medication? ?They need to know if you have any of these conditions: ?blood clots ?myelodysplastic syndrome ?an unusual or allergic reaction to romiplostim, mannitol, other medicines, foods, dyes, or preservatives ?pregnant or trying to get pregnant ?breast-feeding ?How should I use this medication? ?This medicine is injected under the skin. It is given by a health care provider in a hospital or clinic setting. ?A special MedGuide will be given to you before each treatment. Be sure to read this information carefully each time. ?Talk to your health care provider about the use of this medicine in children. While it may be prescribed for children as young as newborns for selected conditions, precautions do apply. ?Overdosage: If you think you have taken too much of this medicine contact a poison control center or emergency room at once. ?NOTE: This medicine is only for you. Do not share this medicine with others. ?What if I miss a dose? ?Keep appointments for follow-up doses. It is important not to miss your dose. Call your health care provider if you are unable to keep an appointment. ?What may interact with this medication? ?Interactions are not expected. ?This list may not describe all possible interactions. Give your health care provider a list of all the medicines, herbs, non-prescription drugs, or dietary supplements you use. Also tell them if you smoke, drink alcohol, or use illegal drugs. Some items may interact with your medicine. ?What should I  watch for while using this medication? ?Visit your health care provider for regular checks on your progress. You may need blood work done while you are taking this medicine. Your condition will be monitored carefully while you are receiving this medicine. It is important not to miss any appointments. ?What side effects may I notice from receiving this medication? ?Side effects that you should report to your doctor or health care professional as soon as possible: ?allergic reactions (skin rash, itching or hives; swelling of the face, lips, or tongue) ?bleeding (bloody or black, tarry stools; red or dark brown urine; spitting up blood or brown material that looks like coffee grounds; red spots on the skin; unusual bruising or bleeding from the eyes, gums, or nose) ?blood clot (chest pain; shortness of breath; pain, swelling, or warmth in the leg) ?stroke (changes in vision; confusion; trouble speaking or understanding; severe headaches; sudden numbness or weakness of the face, arm or leg; trouble walking; dizziness; loss of balance or coordination) ?Side effects that usually do not require medical attention (report to your doctor or health care professional if they continue or are bothersome): ?diarrhea ?dizziness ?headache ?joint pain ?muscle pain ?stomach pain ?trouble sleeping ?This list may not describe all possible side effects. Call your doctor for medical advice about side effects. You may report side effects to FDA at 1-800-FDA-1088. ?Where should I keep my medication? ?This medicine is given in a hospital or clinic. It will not be stored at home. ?NOTE: This sheet is a summary. It may not cover all possible information. If you have questions about this medicine, talk to your doctor, pharmacist, or health care provider. ??   2022 Elsevier/Gold Standard (2021-02-16 00:00:00) ? ?

## 2021-07-04 ENCOUNTER — Other Ambulatory Visit: Payer: Self-pay | Admitting: Hematology

## 2021-07-04 DIAGNOSIS — C9310 Chronic myelomonocytic leukemia not having achieved remission: Secondary | ICD-10-CM

## 2021-07-05 ENCOUNTER — Other Ambulatory Visit: Payer: Self-pay | Admitting: Hematology

## 2021-07-05 ENCOUNTER — Ambulatory Visit (HOSPITAL_COMMUNITY)
Admission: RE | Admit: 2021-07-05 | Discharge: 2021-07-05 | Disposition: A | Discharge status: Home / Self Care | Payer: Medicare HMO | Source: Ambulatory Visit | Attending: Hematology | Admitting: Hematology

## 2021-07-05 ENCOUNTER — Other Ambulatory Visit: Payer: Self-pay

## 2021-07-05 ENCOUNTER — Inpatient Hospital Stay: Payer: Medicare HMO

## 2021-07-05 DIAGNOSIS — Z79899 Other long term (current) drug therapy: Secondary | ICD-10-CM | POA: Diagnosis not present

## 2021-07-05 DIAGNOSIS — C9310 Chronic myelomonocytic leukemia not having achieved remission: Secondary | ICD-10-CM

## 2021-07-05 DIAGNOSIS — Z5111 Encounter for antineoplastic chemotherapy: Secondary | ICD-10-CM | POA: Diagnosis not present

## 2021-07-05 DIAGNOSIS — D696 Thrombocytopenia, unspecified: Secondary | ICD-10-CM

## 2021-07-05 DIAGNOSIS — Z452 Encounter for adjustment and management of vascular access device: Secondary | ICD-10-CM | POA: Diagnosis not present

## 2021-07-05 DIAGNOSIS — D510 Vitamin B12 deficiency anemia due to intrinsic factor deficiency: Secondary | ICD-10-CM | POA: Diagnosis not present

## 2021-07-05 DIAGNOSIS — E1142 Type 2 diabetes mellitus with diabetic polyneuropathy: Secondary | ICD-10-CM | POA: Diagnosis not present

## 2021-07-05 DIAGNOSIS — D693 Immune thrombocytopenic purpura: Secondary | ICD-10-CM | POA: Diagnosis not present

## 2021-07-05 DIAGNOSIS — R69 Illness, unspecified: Secondary | ICD-10-CM | POA: Diagnosis not present

## 2021-07-05 DIAGNOSIS — I10 Essential (primary) hypertension: Secondary | ICD-10-CM | POA: Diagnosis not present

## 2021-07-05 DIAGNOSIS — R519 Headache, unspecified: Secondary | ICD-10-CM | POA: Diagnosis not present

## 2021-07-05 LAB — SAMPLE TO BLOOD BANK

## 2021-07-05 LAB — CBC WITH DIFFERENTIAL (CANCER CENTER ONLY)
Abs Immature Granulocytes: 0.31 10*3/uL — ABNORMAL HIGH (ref 0.00–0.07)
Basophils Absolute: 0.1 10*3/uL (ref 0.0–0.1)
Basophils Relative: 1 %
Eosinophils Absolute: 0.1 10*3/uL (ref 0.0–0.5)
Eosinophils Relative: 1 %
HCT: 28.6 % — ABNORMAL LOW (ref 36.0–46.0)
Hemoglobin: 8.8 g/dL — ABNORMAL LOW (ref 12.0–15.0)
Immature Granulocytes: 4 %
Lymphocytes Relative: 12 %
Lymphs Abs: 0.9 10*3/uL (ref 0.7–4.0)
MCH: 26 pg (ref 26.0–34.0)
MCHC: 30.8 g/dL (ref 30.0–36.0)
MCV: 84.4 fL (ref 80.0–100.0)
Monocytes Absolute: 0.9 10*3/uL (ref 0.1–1.0)
Monocytes Relative: 12 %
Neutro Abs: 5.1 10*3/uL (ref 1.7–7.7)
Neutrophils Relative %: 70 %
Platelet Count: 24 10*3/uL — ABNORMAL LOW (ref 150–400)
RBC: 3.39 MIL/uL — ABNORMAL LOW (ref 3.87–5.11)
RDW: 20.4 % — ABNORMAL HIGH (ref 11.5–15.5)
WBC Count: 7.4 10*3/uL (ref 4.0–10.5)
nRBC: 0 % (ref 0.0–0.2)

## 2021-07-05 LAB — CMP (CANCER CENTER ONLY)
ALT: 17 U/L (ref 0–44)
AST: 16 U/L (ref 15–41)
Albumin: 3.4 g/dL — ABNORMAL LOW (ref 3.5–5.0)
Alkaline Phosphatase: 92 U/L (ref 38–126)
Anion gap: 5 (ref 5–15)
BUN: 17 mg/dL (ref 8–23)
CO2: 27 mmol/L (ref 22–32)
Calcium: 8.7 mg/dL — ABNORMAL LOW (ref 8.9–10.3)
Chloride: 107 mmol/L (ref 98–111)
Creatinine: 0.58 mg/dL (ref 0.44–1.00)
GFR, Estimated: >60 mL/min (ref >60)
Glucose, Bld: 185 mg/dL — ABNORMAL HIGH (ref 70–99)
Potassium: 4.2 mmol/L (ref 3.5–5.1)
Sodium: 139 mmol/L (ref 135–145)
Total Bilirubin: 0.7 mg/dL (ref 0.3–1.2)
Total Protein: 6.9 g/dL (ref 6.5–8.1)

## 2021-07-05 MED ORDER — SODIUM CHLORIDE 0.9% FLUSH: 10.0000 mL | INTRAVENOUS | Status: DC | PRN

## 2021-07-05 NOTE — Progress Notes (Signed)
Plts 24 per Dr. Burr Medico pt does not need a plt transfusion today.  When assessing pt's PICC line prior to flushing and heparin locking her line this RN noticed that her line had backed out of the skin a significant amount approximately 4 cm d/t this the line is unable to be used at this time.  This RN had another RN, Seth Bake, assess the site as well.  This RN has notified the MD of this change in her PICC line.  MD came to inf and accessed pt's line.  MD ordered CXR to be done today.

## 2021-07-08 ENCOUNTER — Inpatient Hospital Stay: Payer: Medicare HMO

## 2021-07-08 ENCOUNTER — Other Ambulatory Visit: Payer: Self-pay

## 2021-07-08 VITALS — BP 109/72 | HR 81 | Temp 98.6°F | Resp 17

## 2021-07-08 DIAGNOSIS — C9310 Chronic myelomonocytic leukemia not having achieved remission: Secondary | ICD-10-CM

## 2021-07-08 DIAGNOSIS — D510 Vitamin B12 deficiency anemia due to intrinsic factor deficiency: Secondary | ICD-10-CM | POA: Diagnosis not present

## 2021-07-08 DIAGNOSIS — Z79899 Other long term (current) drug therapy: Secondary | ICD-10-CM | POA: Diagnosis not present

## 2021-07-08 DIAGNOSIS — R69 Illness, unspecified: Secondary | ICD-10-CM | POA: Diagnosis not present

## 2021-07-08 DIAGNOSIS — D693 Immune thrombocytopenic purpura: Secondary | ICD-10-CM | POA: Diagnosis not present

## 2021-07-08 DIAGNOSIS — I10 Essential (primary) hypertension: Secondary | ICD-10-CM | POA: Diagnosis not present

## 2021-07-08 DIAGNOSIS — Z5111 Encounter for antineoplastic chemotherapy: Secondary | ICD-10-CM | POA: Diagnosis not present

## 2021-07-08 DIAGNOSIS — R519 Headache, unspecified: Secondary | ICD-10-CM | POA: Diagnosis not present

## 2021-07-08 DIAGNOSIS — D696 Thrombocytopenia, unspecified: Secondary | ICD-10-CM

## 2021-07-08 DIAGNOSIS — Z452 Encounter for adjustment and management of vascular access device: Secondary | ICD-10-CM

## 2021-07-08 DIAGNOSIS — E1142 Type 2 diabetes mellitus with diabetic polyneuropathy: Secondary | ICD-10-CM | POA: Diagnosis not present

## 2021-07-08 LAB — CMP (CANCER CENTER ONLY)
ALT: 14 U/L (ref 0–44)
AST: 12 U/L — ABNORMAL LOW (ref 15–41)
Albumin: 3.7 g/dL (ref 3.5–5.0)
Alkaline Phosphatase: 103 U/L (ref 38–126)
Anion gap: 5 (ref 5–15)
BUN: 15 mg/dL (ref 8–23)
CO2: 27 mmol/L (ref 22–32)
Calcium: 8.8 mg/dL — ABNORMAL LOW (ref 8.9–10.3)
Chloride: 107 mmol/L (ref 98–111)
Creatinine: 0.66 mg/dL (ref 0.44–1.00)
GFR, Estimated: >60 mL/min (ref >60)
Glucose, Bld: 221 mg/dL — ABNORMAL HIGH (ref 70–99)
Potassium: 4.3 mmol/L (ref 3.5–5.1)
Sodium: 139 mmol/L (ref 135–145)
Total Bilirubin: 0.6 mg/dL (ref 0.3–1.2)
Total Protein: 6.9 g/dL (ref 6.5–8.1)

## 2021-07-08 LAB — CBC WITH DIFFERENTIAL (CANCER CENTER ONLY)
Abs Immature Granulocytes: 0.1 10*3/uL — ABNORMAL HIGH (ref 0.00–0.07)
Basophils Absolute: 0.1 10*3/uL (ref 0.0–0.1)
Basophils Relative: 1 %
Eosinophils Absolute: 0 10*3/uL (ref 0.0–0.5)
Eosinophils Relative: 1 %
HCT: 28.8 % — ABNORMAL LOW (ref 36.0–46.0)
Hemoglobin: 8.8 g/dL — ABNORMAL LOW (ref 12.0–15.0)
Immature Granulocytes: 1 %
Lymphocytes Relative: 9 %
Lymphs Abs: 0.7 10*3/uL (ref 0.7–4.0)
MCH: 25.8 pg — ABNORMAL LOW (ref 26.0–34.0)
MCHC: 30.6 g/dL (ref 30.0–36.0)
MCV: 84.5 fL (ref 80.0–100.0)
Monocytes Absolute: 1.2 10*3/uL — ABNORMAL HIGH (ref 0.1–1.0)
Monocytes Relative: 14 %
Neutro Abs: 6.4 10*3/uL (ref 1.7–7.7)
Neutrophils Relative %: 74 %
Platelet Count: 51 10*3/uL — ABNORMAL LOW (ref 150–400)
RBC: 3.41 MIL/uL — ABNORMAL LOW (ref 3.87–5.11)
RDW: 20.4 % — ABNORMAL HIGH (ref 11.5–15.5)
WBC Count: 8.5 10*3/uL (ref 4.0–10.5)
nRBC: 0 % (ref 0.0–0.2)

## 2021-07-08 LAB — SAMPLE TO BLOOD BANK

## 2021-07-08 MED ORDER — SODIUM CHLORIDE 0.9% FLUSH
10.0000 mL | INTRAVENOUS | Status: AC | PRN
  Administered 2021-07-08: 10 mL

## 2021-07-08 MED ORDER — SODIUM CHLORIDE 0.9% FLUSH
10.0000 mL | Freq: Once | INTRAVENOUS | Status: AC
  Administered 2021-07-08: 10 mL via INTRAVENOUS

## 2021-07-08 MED ORDER — HEPARIN SOD (PORK) LOCK FLUSH 100 UNIT/ML IV SOLN
500.0000 [IU] | Freq: Once | INTRAVENOUS | Status: AC
  Administered 2021-07-08: 500 [IU] via INTRAVENOUS

## 2021-07-08 MED ORDER — ROMIPLOSTIM INJECTION 500 MCG
635.0000 ug | Freq: Once | SUBCUTANEOUS | Status: AC
  Administered 2021-07-08: 635 ug via SUBCUTANEOUS
  Filled 2021-07-08: qty 1

## 2021-07-08 NOTE — Patient Instructions (Signed)
Romiplostim injection ?What is this medication? ?ROMIPLOSTIM (roe mi PLOE stim) helps your body make more platelets. This medicine is used to treat low platelets caused by chronic idiopathic thrombocytopenic purpura (ITP) or a bone marrow syndrome caused by radiation sickness. ?This medicine may be used for other purposes; ask your health care provider or pharmacist if you have questions. ?COMMON BRAND NAME(S): Nplate ?What should I tell my care team before I take this medication? ?They need to know if you have any of these conditions: ?blood clots ?myelodysplastic syndrome ?an unusual or allergic reaction to romiplostim, mannitol, other medicines, foods, dyes, or preservatives ?pregnant or trying to get pregnant ?breast-feeding ?How should I use this medication? ?This medicine is injected under the skin. It is given by a health care provider in a hospital or clinic setting. ?A special MedGuide will be given to you before each treatment. Be sure to read this information carefully each time. ?Talk to your health care provider about the use of this medicine in children. While it may be prescribed for children as young as newborns for selected conditions, precautions do apply. ?Overdosage: If you think you have taken too much of this medicine contact a poison control center or emergency room at once. ?NOTE: This medicine is only for you. Do not share this medicine with others. ?What if I miss a dose? ?Keep appointments for follow-up doses. It is important not to miss your dose. Call your health care provider if you are unable to keep an appointment. ?What may interact with this medication? ?Interactions are not expected. ?This list may not describe all possible interactions. Give your health care provider a list of all the medicines, herbs, non-prescription drugs, or dietary supplements you use. Also tell them if you smoke, drink alcohol, or use illegal drugs. Some items may interact with your medicine. ?What should I  watch for while using this medication? ?Visit your health care provider for regular checks on your progress. You may need blood work done while you are taking this medicine. Your condition will be monitored carefully while you are receiving this medicine. It is important not to miss any appointments. ?What side effects may I notice from receiving this medication? ?Side effects that you should report to your doctor or health care professional as soon as possible: ?allergic reactions (skin rash, itching or hives; swelling of the face, lips, or tongue) ?bleeding (bloody or black, tarry stools; red or dark brown urine; spitting up blood or brown material that looks like coffee grounds; red spots on the skin; unusual bruising or bleeding from the eyes, gums, or nose) ?blood clot (chest pain; shortness of breath; pain, swelling, or warmth in the leg) ?stroke (changes in vision; confusion; trouble speaking or understanding; severe headaches; sudden numbness or weakness of the face, arm or leg; trouble walking; dizziness; loss of balance or coordination) ?Side effects that usually do not require medical attention (report to your doctor or health care professional if they continue or are bothersome): ?diarrhea ?dizziness ?headache ?joint pain ?muscle pain ?stomach pain ?trouble sleeping ?This list may not describe all possible side effects. Call your doctor for medical advice about side effects. You may report side effects to FDA at 1-800-FDA-1088. ?Where should I keep my medication? ?This medicine is given in a hospital or clinic. It will not be stored at home. ?NOTE: This sheet is a summary. It may not cover all possible information. If you have questions about this medicine, talk to your doctor, pharmacist, or health care provider. ??   2022 Elsevier/Gold Standard (2021-02-16 00:00:00) ? ?

## 2021-07-08 NOTE — Progress Notes (Signed)
No plt trasnfusion today, Pt's plts 51. RN confirmed with MD.  Pt to only receive Nplate injection.

## 2021-07-12 ENCOUNTER — Inpatient Hospital Stay: Payer: Medicare HMO

## 2021-07-12 ENCOUNTER — Other Ambulatory Visit: Payer: Self-pay

## 2021-07-12 ENCOUNTER — Telehealth: Payer: Self-pay

## 2021-07-12 VITALS — BP 112/74 | HR 84 | Temp 98.2°F | Resp 16 | Wt 141.5 lb

## 2021-07-12 DIAGNOSIS — D693 Immune thrombocytopenic purpura: Secondary | ICD-10-CM | POA: Diagnosis not present

## 2021-07-12 DIAGNOSIS — C9310 Chronic myelomonocytic leukemia not having achieved remission: Secondary | ICD-10-CM | POA: Diagnosis not present

## 2021-07-12 DIAGNOSIS — D510 Vitamin B12 deficiency anemia due to intrinsic factor deficiency: Secondary | ICD-10-CM | POA: Diagnosis not present

## 2021-07-12 DIAGNOSIS — D696 Thrombocytopenia, unspecified: Secondary | ICD-10-CM

## 2021-07-12 DIAGNOSIS — Z5111 Encounter for antineoplastic chemotherapy: Secondary | ICD-10-CM | POA: Diagnosis not present

## 2021-07-12 DIAGNOSIS — E1142 Type 2 diabetes mellitus with diabetic polyneuropathy: Secondary | ICD-10-CM | POA: Diagnosis not present

## 2021-07-12 DIAGNOSIS — R69 Illness, unspecified: Secondary | ICD-10-CM | POA: Diagnosis not present

## 2021-07-12 DIAGNOSIS — R519 Headache, unspecified: Secondary | ICD-10-CM | POA: Diagnosis not present

## 2021-07-12 DIAGNOSIS — I10 Essential (primary) hypertension: Secondary | ICD-10-CM | POA: Diagnosis not present

## 2021-07-12 DIAGNOSIS — Z452 Encounter for adjustment and management of vascular access device: Secondary | ICD-10-CM

## 2021-07-12 DIAGNOSIS — Z79899 Other long term (current) drug therapy: Secondary | ICD-10-CM | POA: Diagnosis not present

## 2021-07-12 LAB — CBC WITH DIFFERENTIAL (CANCER CENTER ONLY)
Abs Immature Granulocytes: 0.03 10*3/uL (ref 0.00–0.07)
Basophils Absolute: 0 10*3/uL (ref 0.0–0.1)
Basophils Relative: 0 %
Eosinophils Absolute: 0.1 10*3/uL (ref 0.0–0.5)
Eosinophils Relative: 1 %
HCT: 26.5 % — ABNORMAL LOW (ref 36.0–46.0)
Hemoglobin: 8.3 g/dL — ABNORMAL LOW (ref 12.0–15.0)
Immature Granulocytes: 1 %
Lymphocytes Relative: 11 %
Lymphs Abs: 0.7 10*3/uL (ref 0.7–4.0)
MCH: 26.1 pg (ref 26.0–34.0)
MCHC: 31.3 g/dL (ref 30.0–36.0)
MCV: 83.3 fL (ref 80.0–100.0)
Monocytes Absolute: 0.9 10*3/uL (ref 0.1–1.0)
Monocytes Relative: 15 %
Neutro Abs: 4.4 10*3/uL (ref 1.7–7.7)
Neutrophils Relative %: 72 %
Platelet Count: 90 10*3/uL — ABNORMAL LOW (ref 150–400)
RBC: 3.18 MIL/uL — ABNORMAL LOW (ref 3.87–5.11)
RDW: 20.4 % — ABNORMAL HIGH (ref 11.5–15.5)
WBC Count: 6.1 10*3/uL (ref 4.0–10.5)
nRBC: 0 % (ref 0.0–0.2)

## 2021-07-12 LAB — CMP (CANCER CENTER ONLY)
ALT: 15 U/L (ref 0–44)
AST: 14 U/L — ABNORMAL LOW (ref 15–41)
Albumin: 3.6 g/dL (ref 3.5–5.0)
Alkaline Phosphatase: 99 U/L (ref 38–126)
Anion gap: 5 (ref 5–15)
BUN: 16 mg/dL (ref 8–23)
CO2: 28 mmol/L (ref 22–32)
Calcium: 8.7 mg/dL — ABNORMAL LOW (ref 8.9–10.3)
Chloride: 106 mmol/L (ref 98–111)
Creatinine: 0.66 mg/dL (ref 0.44–1.00)
GFR, Estimated: >60 mL/min (ref >60)
Glucose, Bld: 210 mg/dL — ABNORMAL HIGH (ref 70–99)
Potassium: 4.3 mmol/L (ref 3.5–5.1)
Sodium: 139 mmol/L (ref 135–145)
Total Bilirubin: 0.7 mg/dL (ref 0.3–1.2)
Total Protein: 6.8 g/dL (ref 6.5–8.1)

## 2021-07-12 LAB — SAMPLE TO BLOOD BANK

## 2021-07-12 MED ORDER — SODIUM CHLORIDE 0.9% FLUSH
10.0000 mL | INTRAVENOUS | Status: AC | PRN
  Administered 2021-07-12: 10 mL

## 2021-07-12 MED ORDER — CYANOCOBALAMIN 1000 MCG/ML IJ SOLN
1000.0000 ug | Freq: Once | INTRAMUSCULAR | Status: AC
  Administered 2021-07-12: 1000 ug via INTRAMUSCULAR
  Filled 2021-07-12: qty 1

## 2021-07-12 MED ORDER — ROMIPLOSTIM INJECTION 500 MCG
10.0000 ug/kg | Freq: Once | SUBCUTANEOUS | Status: AC
  Administered 2021-07-12: 640 ug via SUBCUTANEOUS
  Filled 2021-07-12: qty 0.5

## 2021-07-12 NOTE — Telephone Encounter (Signed)
Erin with [facility] called to request patient's diabetic supplies.  Patient is needing strips and lancets for the [facility].  [facility]  [address], [place], [place]

## 2021-07-12 NOTE — Progress Notes (Signed)
Pt agreed to [employer] placement.  Notified Dr. Burr Medico and verbal order given for [facility] placement.

## 2021-07-12 NOTE — Progress Notes (Signed)
Per Dr. Burr Medico, ok for B12 and Nplate injections today. Pt. Platelets 90.

## 2021-07-12 NOTE — Patient Instructions (Signed)
Romiplostim injection What is this medication? ROMIPLOSTIM (roe mi PLOE stim) helps your body make more platelets. This medicine is used to treat low platelets caused by chronic idiopathic thrombocytopenic purpura (ITP) or a bone marrow syndrome caused by radiation sickness. This medicine may be used for other purposes; ask your health care provider or pharmacist if you have questions. COMMON BRAND NAME(S): Nplate What should I tell my care team before I take this medication? They need to know if you have any of these conditions: blood clots myelodysplastic syndrome an unusual or allergic reaction to romiplostim, mannitol, other medicines, foods, dyes, or preservatives pregnant or trying to get pregnant breast-feeding How should I use this medication? This medicine is injected under the skin. It is given by a health care provider in a hospital or clinic setting. A special MedGuide will be given to you before each treatment. Be sure to read this information carefully each time. Talk to your health care provider about the use of this medicine in children. While it may be prescribed for children as young as newborns for selected conditions, precautions do apply. Overdosage: If you think you have taken too much of this medicine contact a poison control center or emergency room at once. NOTE: This medicine is only for you. Do not share this medicine with others. What if I miss a dose? Keep appointments for follow-up doses. It is important not to miss your dose. Call your health care provider if you are unable to keep an appointment. What may interact with this medication? Interactions are not expected. This list may not describe all possible interactions. Give your health care provider a list of all the medicines, herbs, non-prescription drugs, or dietary supplements you use. Also tell them if you smoke, drink alcohol, or use illegal drugs. Some items may interact with your medicine. What should I  watch for while using this medication? Visit your health care provider for regular checks on your progress. You may need blood work done while you are taking this medicine. Your condition will be monitored carefully while you are receiving this medicine. It is important not to miss any appointments. What side effects may I notice from receiving this medication? Side effects that you should report to your doctor or health care professional as soon as possible: allergic reactions (skin rash, itching or hives; swelling of the face, lips, or tongue) bleeding (bloody or black, tarry stools; red or dark brown urine; spitting up blood or brown material that looks like coffee grounds; red spots on the skin; unusual bruising or bleeding from the eyes, gums, or nose) blood clot (chest pain; shortness of breath; pain, swelling, or warmth in the leg) stroke (changes in vision; confusion; trouble speaking or understanding; severe headaches; sudden numbness or weakness of the face, arm or leg; trouble walking; dizziness; loss of balance or coordination) Side effects that usually do not require medical attention (report to your doctor or health care professional if they continue or are bothersome): diarrhea dizziness headache joint pain muscle pain stomach pain trouble sleeping This list may not describe all possible side effects. Call your doctor for medical advice about side effects. You may report side effects to FDA at 1-800-FDA-1088. Where should I keep my medication? This medicine is given in a hospital or clinic. It will not be stored at home. NOTE: This sheet is a summary. It may not cover all possible information. If you have questions about this medicine, talk to your doctor, pharmacist, or health care provider.  Vitamin B12 Injection What is this medication? Vitamin B12 (VAHY tuh min B12) prevents and treats low vitamin B12 levels in your body. It is used in people who do not get enough vitamin  B12 from their diet or when their digestive tract does not absorb enough. Vitamin B12 plays an important role in maintaining the health of your nervous system and red blood cells. This medicine may be used for other purposes; ask your health care provider or pharmacist if you have questions. COMMON BRAND NAME(S): B-12 Compliance Kit, B-12 Injection Kit, Cyomin, Dodex, LA-12, Nutri-Twelve, Physicians EZ Use B-12, Primabalt What should I tell my care team before I take this medication? They need to know if you have any of these conditions: Kidney disease Leber's disease Megaloblastic anemia An unusual or allergic reaction to cyanocobalamin, cobalt, other medications, foods, dyes, or preservatives Pregnant or trying to get pregnant Breast-feeding How should I use this medication? This medication is injected into a muscle or deeply under the skin. It is usually given in a clinic or care team's office. However, your care team may teach you how to inject yourself. Follow all instructions. Talk to your care team about the use of this medication in children. Special care may be needed. Overdosage: If you think you have taken too much of this medicine contact a poison control center or emergency room at once. NOTE: This medicine is only for you. Do not share this medicine with others. What if I miss a dose? If you are given your dose at a clinic or care team's office, call to reschedule your appointment. If you give your own injections, and you miss a dose, take it as soon as you can. If it is almost time for your next dose, take only that dose. Do not take double or extra doses. What may interact with this medication? Colchicine Heavy alcohol intake This list may not describe all possible interactions. Give your health care provider a list of all the medicines, herbs, non-prescription drugs, or dietary supplements you use. Also tell them if you smoke, drink alcohol, or use illegal drugs. Some items may  interact with your medicine. What should I watch for while using this medication? Visit your care team regularly. You may need blood work done while you are taking this medication. You may need to follow a special diet. Talk to your care team. Limit your alcohol intake and avoid smoking to get the best benefit. What side effects may I notice from receiving this medication? Side effects that you should report to your care team as soon as possible: Allergic reactions--skin rash, itching, hives, swelling of the face, lips, tongue, or throat Swelling of the ankles, hands, or feet Trouble breathing Side effects that usually do not require medical attention (report to your care team if they continue or are bothersome): Diarrhea This list may not describe all possible side effects. Call your doctor for medical advice about side effects. You may report side effects to FDA at 1-800-FDA-1088. Where should I keep my medication? Keep out of the reach of children. Store at room temperature between 15 and 30 degrees C (59 and 85 degrees F). Protect from light. Throw away any unused medication after the expiration date. NOTE: This sheet is a summary. It may not cover all possible information. If you have questions about this medicine, talk to your doctor, pharmacist, or health care provider.  2022 Elsevier/Gold Standard (2020-08-12 00:00:00)   2022 Elsevier/Gold Standard (2021-02-16 00:00:00)

## 2021-07-13 MED ORDER — ONETOUCH VERIO VI STRP: ORAL_STRIP | 12 refills | Status: DC

## 2021-07-13 MED ORDER — ONETOUCH ULTRASOFT LANCETS MISC: 12 refills | Status: DC

## 2021-07-13 NOTE — Telephone Encounter (Signed)
Rx sent 

## 2021-07-14 DIAGNOSIS — F4323 Adjustment disorder with mixed anxiety and depressed mood: Secondary | ICD-10-CM | POA: Diagnosis not present

## 2021-07-14 DIAGNOSIS — R768 Other specified abnormal immunological findings in serum: Secondary | ICD-10-CM | POA: Diagnosis not present

## 2021-07-14 DIAGNOSIS — C9310 Chronic myelomonocytic leukemia not having achieved remission: Secondary | ICD-10-CM | POA: Diagnosis not present

## 2021-07-14 DIAGNOSIS — R69 Illness, unspecified: Secondary | ICD-10-CM | POA: Diagnosis not present

## 2021-07-14 DIAGNOSIS — E1169 Type 2 diabetes mellitus with other specified complication: Secondary | ICD-10-CM | POA: Diagnosis not present

## 2021-07-14 DIAGNOSIS — G4700 Insomnia, unspecified: Secondary | ICD-10-CM | POA: Diagnosis not present

## 2021-07-14 DIAGNOSIS — F411 Generalized anxiety disorder: Secondary | ICD-10-CM | POA: Diagnosis not present

## 2021-07-15 ENCOUNTER — Inpatient Hospital Stay: Payer: Medicare HMO

## 2021-07-15 ENCOUNTER — Other Ambulatory Visit: Payer: Self-pay

## 2021-07-15 DIAGNOSIS — M549 Dorsalgia, unspecified: Secondary | ICD-10-CM | POA: Diagnosis not present

## 2021-07-15 DIAGNOSIS — Z9221 Personal history of antineoplastic chemotherapy: Secondary | ICD-10-CM | POA: Diagnosis not present

## 2021-07-15 DIAGNOSIS — Z79899 Other long term (current) drug therapy: Secondary | ICD-10-CM | POA: Diagnosis not present

## 2021-07-15 DIAGNOSIS — G893 Neoplasm related pain (acute) (chronic): Secondary | ICD-10-CM | POA: Diagnosis not present

## 2021-07-15 DIAGNOSIS — R69 Illness, unspecified: Secondary | ICD-10-CM | POA: Diagnosis not present

## 2021-07-15 DIAGNOSIS — K5900 Constipation, unspecified: Secondary | ICD-10-CM | POA: Diagnosis not present

## 2021-07-15 DIAGNOSIS — C9310 Chronic myelomonocytic leukemia not having achieved remission: Secondary | ICD-10-CM | POA: Diagnosis not present

## 2021-07-16 MED FILL — Dexamethasone Sodium Phosphate Inj 100 MG/10ML: INTRAMUSCULAR | Qty: 1 | Status: AC

## 2021-07-19 ENCOUNTER — Other Ambulatory Visit: Payer: Self-pay

## 2021-07-19 ENCOUNTER — Inpatient Hospital Stay: Payer: Medicare HMO

## 2021-07-19 ENCOUNTER — Inpatient Hospital Stay (HOSPITAL_BASED_OUTPATIENT_CLINIC_OR_DEPARTMENT_OTHER): Payer: Medicare HMO | Admitting: Hematology

## 2021-07-19 ENCOUNTER — Inpatient Hospital Stay: Payer: Medicare HMO | Attending: Hematology

## 2021-07-19 ENCOUNTER — Encounter: Payer: Self-pay | Admitting: Hematology

## 2021-07-19 VITALS — BP 115/68 | HR 99 | Temp 98.1°F | Resp 18 | Ht 63.0 in | Wt 144.9 lb

## 2021-07-19 DIAGNOSIS — R519 Headache, unspecified: Secondary | ICD-10-CM | POA: Diagnosis not present

## 2021-07-19 DIAGNOSIS — E538 Deficiency of other specified B group vitamins: Secondary | ICD-10-CM | POA: Diagnosis not present

## 2021-07-19 DIAGNOSIS — C9310 Chronic myelomonocytic leukemia not having achieved remission: Secondary | ICD-10-CM

## 2021-07-19 DIAGNOSIS — E1142 Type 2 diabetes mellitus with diabetic polyneuropathy: Secondary | ICD-10-CM | POA: Insufficient documentation

## 2021-07-19 DIAGNOSIS — Z79899 Other long term (current) drug therapy: Secondary | ICD-10-CM | POA: Diagnosis not present

## 2021-07-19 DIAGNOSIS — F410 Panic disorder [episodic paroxysmal anxiety] without agoraphobia: Secondary | ICD-10-CM | POA: Insufficient documentation

## 2021-07-19 DIAGNOSIS — Z5111 Encounter for antineoplastic chemotherapy: Secondary | ICD-10-CM | POA: Diagnosis not present

## 2021-07-19 DIAGNOSIS — I10 Essential (primary) hypertension: Secondary | ICD-10-CM | POA: Diagnosis not present

## 2021-07-19 DIAGNOSIS — R69 Illness, unspecified: Secondary | ICD-10-CM | POA: Diagnosis not present

## 2021-07-19 DIAGNOSIS — D696 Thrombocytopenia, unspecified: Secondary | ICD-10-CM

## 2021-07-19 DIAGNOSIS — D693 Immune thrombocytopenic purpura: Secondary | ICD-10-CM | POA: Diagnosis present

## 2021-07-19 DIAGNOSIS — D649 Anemia, unspecified: Secondary | ICD-10-CM | POA: Insufficient documentation

## 2021-07-19 LAB — CMP (CANCER CENTER ONLY)
ALT: 12 U/L (ref 0–44)
AST: 11 U/L — ABNORMAL LOW (ref 15–41)
Albumin: 3.5 g/dL (ref 3.5–5.0)
Alkaline Phosphatase: 107 U/L (ref 38–126)
Anion gap: 5 (ref 5–15)
BUN: 15 mg/dL (ref 8–23)
CO2: 27 mmol/L (ref 22–32)
Calcium: 8.5 mg/dL — ABNORMAL LOW (ref 8.9–10.3)
Chloride: 107 mmol/L (ref 98–111)
Creatinine: 0.63 mg/dL (ref 0.44–1.00)
GFR, Estimated: >60 mL/min (ref >60)
Glucose, Bld: 222 mg/dL — ABNORMAL HIGH (ref 70–99)
Potassium: 4.2 mmol/L (ref 3.5–5.1)
Sodium: 139 mmol/L (ref 135–145)
Total Bilirubin: 0.8 mg/dL (ref 0.3–1.2)
Total Protein: 6.7 g/dL (ref 6.5–8.1)

## 2021-07-19 LAB — CBC WITH DIFFERENTIAL (CANCER CENTER ONLY)
Abs Immature Granulocytes: 0.01 10*3/uL (ref 0.00–0.07)
Basophils Absolute: 0 10*3/uL (ref 0.0–0.1)
Basophils Relative: 1 %
Eosinophils Absolute: 0 10*3/uL (ref 0.0–0.5)
Eosinophils Relative: 1 %
HCT: 25.8 % — ABNORMAL LOW (ref 36.0–46.0)
Hemoglobin: 7.9 g/dL — ABNORMAL LOW (ref 12.0–15.0)
Immature Granulocytes: 0 %
Lymphocytes Relative: 16 %
Lymphs Abs: 0.6 10*3/uL — ABNORMAL LOW (ref 0.7–4.0)
MCH: 25.5 pg — ABNORMAL LOW (ref 26.0–34.0)
MCHC: 30.6 g/dL (ref 30.0–36.0)
MCV: 83.2 fL (ref 80.0–100.0)
Monocytes Absolute: 0.5 10*3/uL (ref 0.1–1.0)
Monocytes Relative: 13 %
Neutro Abs: 2.5 10*3/uL (ref 1.7–7.7)
Neutrophils Relative %: 69 %
Platelet Count: 163 10*3/uL (ref 150–400)
RBC: 3.1 MIL/uL — ABNORMAL LOW (ref 3.87–5.11)
RDW: 19.9 % — ABNORMAL HIGH (ref 11.5–15.5)
WBC Count: 3.7 10*3/uL — ABNORMAL LOW (ref 4.0–10.5)
nRBC: 0 % (ref 0.0–0.2)

## 2021-07-19 LAB — SAMPLE TO BLOOD BANK

## 2021-07-19 LAB — PREPARE RBC (CROSSMATCH)

## 2021-07-19 MED ORDER — PALONOSETRON HCL INJECTION 0.25 MG/5ML
0.2500 mg | Freq: Once | INTRAVENOUS | Status: AC
  Administered 2021-07-19: 0.25 mg via INTRAVENOUS
  Filled 2021-07-19: qty 5

## 2021-07-19 MED ORDER — SODIUM CHLORIDE 0.9 % IV SOLN
75.0000 mg/m2 | Freq: Once | INTRAVENOUS | Status: AC
  Administered 2021-07-19: 135 mg via INTRAVENOUS
  Filled 2021-07-19: qty 13.5

## 2021-07-19 MED ORDER — ROMIPLOSTIM INJECTION 500 MCG
9.0000 ug/kg | Freq: Once | SUBCUTANEOUS | Status: AC
  Administered 2021-07-19: 590 ug via SUBCUTANEOUS
  Filled 2021-07-19: qty 1

## 2021-07-19 MED ORDER — SODIUM CHLORIDE 0.9% FLUSH
10.0000 mL | INTRAVENOUS | Status: AC | PRN
  Administered 2021-07-19: 10 mL

## 2021-07-19 MED ORDER — SODIUM CHLORIDE 0.9% FLUSH
10.0000 mL | INTRAVENOUS | Status: DC | PRN
  Administered 2021-07-19: 10 mL

## 2021-07-19 MED ORDER — HEPARIN SOD (PORK) LOCK FLUSH 100 UNIT/ML IV SOLN
500.0000 [IU] | Freq: Once | INTRAVENOUS | Status: AC | PRN
  Administered 2021-07-19: 500 [IU]

## 2021-07-19 MED ORDER — SODIUM CHLORIDE 0.9 % IV SOLN: Freq: Once | INTRAVENOUS | Status: AC

## 2021-07-19 MED ORDER — SODIUM CHLORIDE 0.9 % IV SOLN
10.0000 mg | Freq: Once | INTRAVENOUS | Status: AC
  Administered 2021-07-19: 10 mg via INTRAVENOUS
  Filled 2021-07-19: qty 10

## 2021-07-19 MED FILL — Dexamethasone Sodium Phosphate Inj 100 MG/10ML: INTRAMUSCULAR | Qty: 1 | Status: AC

## 2021-07-19 NOTE — Patient Instructions (Signed)
[facility] MEDICAL [facility]COLOGY  Discharge Instructions: Thank you for choosing [facility] to provide your oncology and hematology care.   If you have a lab appointment with the Cancer Center, please go directly to the Cancer Center and check in at the registration area.   Wear comfortable clothing and clothing appropriate for easy access to any Portacath or PICC line.   We strive to give you quality time with your provider. You may need to reschedule your appointment if you arrive late (15 or more minutes).  Arriving late affects you and other patients whose appointments are after yours.  Also, if you miss three or more appointments without notifying the office, you may be dismissed from the clinic at the provider's discretion.      For prescription refill requests, have your pharmacy contact our office and allow 72 hours for refills to be completed.   Today you received the following chemotherapy and/or immunotherapy agents Vidaza     To help prevent nausea and vomiting after your treatment, we encourage you to take your nausea medication as directed.  BELOW ARE SYMPTOMS THAT SHOULD BE REPORTED IMMEDIATELY: *FEVER GREATER THAN 100.4 F (38 C) OR HIGHER *CHILLS OR SWEATING *NAUSEA AND VOMITING THAT IS NOT CONTROLLED WITH YOUR NAUSEA MEDICATION *UNUSUAL SHORTNESS OF BREATH *UNUSUAL BRUISING OR BLEEDING *URINARY PROBLEMS (pain or burning when urinating, or frequent urination) *BOWEL PROBLEMS (unusual diarrhea, constipation, pain near the anus) TENDERNESS IN MOUTH AND THROAT WITH OR WITHOUT PRESENCE OF ULCERS (sore throat, sores in mouth, or a toothache) UNUSUAL RASH, SWELLING OR PAIN  UNUSUAL VAGINAL DISCHARGE OR ITCHING   Items with * indicate a potential emergency and should be followed up as soon as possible or go to the Emergency Department if any problems should occur.  Please show the CHEMOTHERAPY ALERT CARD or IMMUNOTHERAPY ALERT CARD at check-in to the  Emergency Department and triage nurse.  Should you have questions after your visit or need to cancel or reschedule your appointment, please contact [facility]  Dept: [phone]  and follow the prompts.  Office hours are 8:00 a.m. to 4:30 p.m. Monday - Friday. Please note that voicemails left after 4:00 p.m. may not be returned until the following business day.  We are closed weekends and major holidays. You have access to a nurse at all times for urgent questions. Please call the main number to the clinic Dept: [phone] and follow the prompts.   For any non-urgent questions, you may also contact your provider using MyChart. We now offer e-Visits for anyone 18 and older to request care online for non-urgent symptoms. For details visit [email or web address].   Also download the MyChart app! Go to the app store, search "MyChart", open the app, select [facility], and log in with your MyChart username and password.  Due to Covid, a mask is required upon entering the hospital/clinic. If you do not have a mask, one will be given to you upon arrival. For doctor visits, patients may have 1 support person aged 18 or older with them. For treatment visits, patients cannot have anyone with them due to current Covid guidelines and our immunocompromised population.   

## 2021-07-19 NOTE — Progress Notes (Signed)
Spoke with Andy Gauss in [facility] to confirm orders for T&S plus Prepare 1unit PRBCs for transfusion on 07/20/2021.

## 2021-07-19 NOTE — Progress Notes (Signed)
Ok to treat today with Hgb of 7.9 per Dr. Burr Medico

## 2021-07-19 NOTE — Progress Notes (Signed)
[facility]   Telephone:(336) [phone] [phone]) [phone]   Clinic Follow up Note   Patient Care Team: [name], DO as PCP - General (Family Medicine) Loletha Carrow, Kirke Corin, MD as Consulting Physician (Gastroenterology) Truitt Merle, MD as Consulting Physician (Hematology) Alda Berthold, DO as Consulting Physician (Neurology)  Date of Service:  07/19/2021  CHIEF COMPLAINT: f/u of [name], severe thrombocytopenia  CURRENT THERAPY:  Azacitidine days 1-5 q28 days, started 12/28/20 Nplate 45mcg/kg weekly, dose reduced since 07/19/21 when plt normalized  Platelet transfusion as needed (plt<15K), blood transfusion if Hg<8.0   ASSESSMENT & PLAN:  Kathleen Gibson is a 71 y.o. female with   1. [name] with severe thrombocytopenia  -Initially diagnosed with ITP in 2014. She lost follow up after 11/2017 and did not proceed with recommended bone marrow biopsy. -She was referred to ED 10/28/20 by her PCP for low plt count of 4k. She was hospitalized and treated with platelet transfusions, IVIG, oral prednisone $RemoveBeforeD'60mg'HyFeyGIwIOHSJQ$  and Nplate injections. She tapered off prednisone given little response. -Bone marrow biopsy 10/30/20 felt to likely represent MDS/MPN, particularly [name].  Confirmed on slide review at [facility] by Dr. Linus Orn -She began treatment for severe refractory thrombocytopenia with weekly Rituxan x4 on 11/19/20, she did not respond  -She began azacitidine daily days 1-5 q. 28 days on 12/28/20 -She had worsening thrombocytopenia after stopping Nplate, restarted weekly Nplate 10 mcg/kg on 6/38/75 -Bone marrow biopsy 03/18/21 showed stable disease, no increase in blasts. -her platelet count has fluctuated and was high enough for kyphoplasty procedure on 05/31/21.  -she last saw Dr. Linus Orn on 07/15/21. She notes they plan to hold on BMB given her good response to chemo. They will re-evaluate in one month. -labs reviewed, plt 163k today, which is WNL. She will proceed with Vidaza today and  daily for the rest of this week.   2.  Thoracic spine compression fractures -T10 and T12 kyphoplasty performed 05/31/21 -she reports improvement in pain and no longer uses thoracic brace. -will hold on Zometa for now until she sees dentist    3. Normocytic anemia, moderate  -H/o pernicious anemia/vitamin B12 deficiency -Transfuse for Hgb </= 7.5 -Continue oral iron    4. Comorbidities: DM type 2 with polyneuropathy, HTN, h/o panic attack, headaches -Continue medication, follow-up PCP -Etiology of headaches unknown, managed with Tylenol -panic attack occurred once, managed with family support and lorazepam.     PLAN: -proceed with azacitidine today -continue azacitidine daily for the rest of this week as scheduled  -1u pRBC to be done this week -port placement scheduled for 2/13 -next cycle Vidaza infusion daily X5, starting 3/6 -F/u on 3/6  -lab and blood transfusion 1-2 times a week as scheduled  -continue weekly Nplate, will reduce dose to 10mcg/kg today, and further reduce by 49mcg/kg every 2 weeks if plt>150K    No problem-specific Assessment & Plan notes found for this encounter.   SUMMARY OF ONCOLOGIC HISTORY: Oncology History  [name] (chronic myelomonocytic leukemia) ([place])  10/29/2020 Imaging   CT CAP  IMPRESSION: 1. Splenomegaly with pathologically enlarged lymph nodes above and below the diaphragm, with overall stable to minimally increased abdominal adenopathy and interval progression of the pelvic adenopathy. 2. Small volume abdominopelvic ascites with diffuse mesenteric stranding. 3. Scattered bilateral pulmonary micro nodules measuring 1-2 mm. 4. Distended gallbladder with some layering hyperdense material representing layering sludge and tiny stones seen on prior ultrasound. 5. Aortic atherosclerosis.   10/30/2020 Pathology Results   DIAGNOSIS:  BONE MARROW, ASPIRATE, CLOT, CORE:  -  Hypercellular bone marrow with panhyperplasia, atypia, and no  increase  in blasts  -  See comment   PERIPHERAL BLOOD:  -  Marked thrombocytopenia  -  Absolute monocytosis  -  Normocytic anemia  -  See CBC data and comment   COMMENT:  The bone marrow is hypercellular for age (approximately 80%) with myeloid hyperplasia with maturational left shift, erythroid hyperplasia, and increased megakaryocytes.  Mild multilineage atypia is present. Blasts are not increased on aspirate smears (1% by manual differential count) or by CD34 immunohistochemistry on the core biopsy.  Concurrent flow cytometric analysis of the bone marrow aspirate demonstrates increased monocytes, and no increase in blasts or abnormal lymphoid population (see [id number]).  Monocytes are also increased in peripheral  blood, persistent per electronic medical record.  In aggregate, the  findings raise the possibility of a myeloid neoplasm with the  differential diagnosis including a low-grade myelodysplastic syndrome and chronic myelomonocytic leukemia (dysplastic type).    ADDENDUM:  A reticulin special stain performed on the bone marrow core biopsy reveals no significant increase in reticulin fibrosis.  ADDENDUM:  CYTOGENETIC RESULTS:  Karyotype: 46,XX[20]  Interpretation: NORMAL FEMALE KARYOTYPE   FISH RESULTS:  Results: NORMAL   ADDENDUM:  CD123 immunohistochemistry performed on the core biopsy highlights scattered aggregates of positively staining cells consistent with plasmacytoid dendritic cells.    10/30/2020 Pathology Results   DIAGNOSIS:   BONE MARROW; FLOW CYTOMETRIC ANALYSIS:  -  Increased monocytes  -  Scant B-cells present  -  No immunophenotypically aberrant T-cell population identified  -  No increase in blasts  -  See comment   COMMENT:  Monocytes are relatively increased (12% of all cells), without aberrant expression of CD56.  B-cells comprise <1% of total lymphocytes. CD34-positive blasts are not increased (<1% of all cells).  Correlation with concurrent morphology is  recommended for complete diagnostic interpretation and overall blast enumeration (see [id number]).    10/30/2020 Pathology Results   FINAL MICROSCOPIC DIAGNOSIS:   A. LYMPH NODE, RIGHT AXILLARY, NEEDLE CORE BIOPSY:  -Lymphoid tissue present  -See comment   COMMENT:  The sections show several small needle core biopsy fragments of lymphoid tissue displaying degenerative cellular changes/necrosis and hence cannot be accurately evaluated.  Sample for flow cytometric analysis not available.  Immunohistochemical stain for CD3 and CD20 were performed with appropriate controls.  There is a mixture of T and B cells in their apparently respective compartments.  There is no definite metastatic malignancy.    11/11/2020 Pathology Results   DIAGNOSIS:   LEFT AXILLARY LYMPH NODE EXCISIONAL BIOPSY; FLOW CYTOMETRIC ANALYSIS:  -  No monotypic B-cell or immunophenotypically aberrant T-cell  population identified  -  See comment   COMMENT:  Flow cytometric analysis identifies B-cells with a normal kappa:lambda ratio of 1.7:1.  A subset of the polytypic B-cells expresses CD10. T-cells show a CD4:CD8 ratio of 3.3:1 without immunophenotypic aberrancy with the markers evaluated.  Although these results do not support the diagnosis of a clonal lymphoid population, sampling issues must always be considered when negative results are obtained, as focal lesions may not be represented in the specimen submitted.  In addition, flow cytometric immunophenotyping will not exclude other pathology if present (e.g. Hodgkin lymphoma, some T-cell lymphomas, metastatic and infectious diseases).   11/11/2020 Pathology Results   FINAL MICROSCOPIC DIAGNOSIS:   A. LYMPH NODE, LEFT AXILLARY, DISSECTION:  -  Follicular hyperplasia with interfollicular expansion  -  See comment  COMMENT:  Sections of the lymph nodes reveal generally preserved lymph node architecture.  There is follicular hyperplasia with some follicles showing  increased hyalinization of germinal centers and mild concentric encircling of germinal centers with small lymphocytes (onion skinning). Interfollicular areas are expanded with increased vascular proliferation, small lymphocytes, plasma cells, histiocytic cells, and patchy neutrophils.  The lymph node capsule is variably thickened by fibrosis with few plasma cells.   A panel of immunohistochemical stains is performed for further  characterization.  CD3 and CD20/PAX5 highlight T-cell and B-cell  compartments, respectively.  Germinal centers are highlighted by CD10 and BCL6 with appropriate absent expression of BCL2.  CD5 is similar to CD3.  CD43 also stains the T-cells.  CD4-positive T-cells exceed CD8-positive T-cells.  CD30 highlights scattered immunoblasts.  CD21 reveals intact follicular dendritic cell meshworks.  CD68 highlights increased histiocytic cells.  HHV 8 is negative.  T. pallidum reveals no definitive organisms.  Pancytokeratin is negative.  TdT stains rare cells.  CD138 highlights plasma cells which are not increased in number and show polytypic light chain expression by kappa/lambda in situ  hybridization.  EBV is negative by in situ hybridization.   Concurrent flow cytometric analysis is negative for a monoclonal B-cell or immunophenotypically aberrant T-cell population (see [id number]).   Together, the findings above are non-specific and can be seen in  reactive and infectious processes as well as Castleman disease.  There is no definitive morphologic or flow cytometric evidence of involvement by a lymphoproliferative disorder from the current workup.   12/17/2020 Initial Diagnosis   [name] (chronic myelomonocytic leukemia) ([place])   12/28/2020 -  Chemotherapy   Patient is on Treatment Plan : MYELODYSPLASIA  Azacitidine IV D1-5 q28d     03/18/2021 Pathology Results   Final Diagnosis    BONE MARROW:             Persistent chronic myelomonocytic leukemia in a hypercellular bone marrow  with increased monocytes and no increase in blasts.   Comment    Flow cytometric analysis showed no increase in blasts and 17% monocytes.A next generation myeloid panel showed the presence of the following mutations: NRAS, SH2B3, PHF6 and TET2. CD34 and CD117 immunstains do not show an increase in blasts. The findings are consistent with persistent [name] with a decrease in the number of blasts compared to that seen on the previous bone marrow.    Final Interpretation      BONE MARROW; FLOW CYTOMETRIC ANALYSIS:   No increased blasts identified. (see comment)  Monocytosis (17% of total events).    COMMENT: Flow cytometry identified about 0.6% of total events as immature cells. These cells express CD45 (dim), CD34, CD117, HLA-DR, CD38, CD33 (variable). This immunophenotype is consistent with normal myeloblasts. In addition, monocytes are increased and comprise of approximately 17% of total events. These monocytes show normal expression of CD33/CD64/CD14/CD11b with variable expression of CD4. Correlation with concurrent morphology and clinical data is recommended (see [id number]).    FLOW CYTOMETRY ANALYSIS: CD45 versus side scatter analysis demonstrate a predominance of granulocytes (~70%), monocytes (~17%) and lymphocytes (~6%). No significant increase in blasts identified. All tested markers were used for adequate analysis of the cells and were appropriately reviewed.        INTERVAL HISTORY:  Kathleen Gibson is here for a follow up of [name], severe thrombocytopenia. She was last seen by me on 06/21/21. She presents to the clinic alone. She reports she is doing well overall. She notes she overworked her back, so she  is recovering from that.   All other systems were reviewed with the patient and are negative.  MEDICAL HISTORY:  Past Medical History:  Diagnosis Date   Diabetes (HCC)    HLD (hyperlipidemia)    Hypertension    Pernicious anemia    Pneumonia 09/05/2019   Renal insufficiency  09/06/2019   Vitamin D deficiency     SURGICAL HISTORY: Past Surgical History:  Procedure Laterality Date   ABDOMINAL HERNIA REPAIR  2009   ABDOMINOPLASTY     AXILLARY LYMPH NODE BIOPSY Left 11/11/2020   Procedure: LEFT AXILLARY EXCISIONAL LYMPH NODE BIOPSY;  Surgeon: Darnell Level, MD;  Location: [facility] OR;  Service: General;  Laterality: Left;   CESAREAN SECTION  1978   GASTRIC BYPASS  2000   hemorroid surgery  2007   IR KYPHO EA ADDL LEVEL THORACIC OR LUMBAR  05/31/2021   IR KYPHO THORACIC WITH BONE BIOPSY  05/31/2021   TONSILLECTOMY AND ADENOIDECTOMY  1957    I have reviewed the social history and family history with the patient and they are unchanged from previous note.  ALLERGIES:  is allergic to asa [aspirin] and nsaids.  MEDICATIONS:  Current Outpatient Medications  Medication Sig Dispense Refill   acetaminophen (TYLENOL) 500 MG tablet Take 2 tablets (1,000 mg total) by mouth every 8 (eight) hours. 30 tablet 0   ammonium lactate (LAC-HYDRIN) 12 % lotion Apply 1 application topically 2 (two) times daily as needed for dry skin.     bisacodyl (DULCOLAX) 10 MG suppository Place 1 suppository (10 mg total) rectally daily as needed for moderate constipation (Please take-if no bowel movement in 1-2 days with MiraLAX/senna). 12 suppository 0   cyanocobalamin (,VITAMIN B-12,) 1000 MCG/ML injection Inject 1,000 mcg into the muscle every 30 (thirty) days.     gabapentin (NEURONTIN) 300 MG capsule Take 2 capsules (600 mg total) by mouth 2 (two) times daily. 360 capsule 1   glucose blood (ONETOUCH VERIO) test strip Use as instructed 100 each 12   Lancets (ONETOUCH ULTRASOFT) lancets Use as instructed 100 each 12   LORazepam (ATIVAN) 1 MG tablet TAKE 1 TABLET BY MOUTH EVERY 8 HOURS AS NEEDED FOR ANXIETY 30 tablet 0   metoprolol succinate (TOPROL-XL) 25 MG 24 hr tablet Take 1 tablet (25 mg total) by mouth daily. 90 tablet 1   naloxone (NARCAN) nasal spray 4 mg/0.1 mL Place 1 spray into the nose  once as needed (overdose). 2 each 0   ondansetron (ZOFRAN-ODT) 4 MG disintegrating tablet Take 4 mg by mouth every 8 (eight) hours as needed for nausea or vomiting.     Oxycodone HCl 10 MG TABS 1 tab every 4-6 hr prn for chronic pain (Patient taking differently: Take 10 mg by mouth every 4 (four) hours as needed (chronic pain). 1 tab every 4-6 hr prn for chronic pain) 120 tablet 0   PARoxetine (PAXIL) 10 MG tablet Take 10 mg by mouth every morning.     polyethylene glycol powder (GLYCOLAX/MIRALAX) 17 GM/SCOOP powder Dissolve 1 capful (17 g) in water and drink 2 (two) times daily. (Patient taking differently: Take 17 g by mouth daily.) 510 g 2   senna-docusate (SENOKOT-S) 8.6-50 MG tablet Take 2 tablets by mouth at bedtime. (Patient taking differently: Take 2 tablets by mouth at bedtime as needed for mild constipation.) 60 tablet 2   sitaGLIPtin-metformin (JANUMET) 50-1000 MG tablet Take 1 tablet by mouth at bedtime. 90 tablet 1   No current facility-administered medications for this visit.  Facility-Administered Medications Ordered in Other Visits  Medication Dose Route Frequency Provider Last Rate Last Admin   0.9 %  sodium chloride infusion (Manually program via Guardrails IV Fluids)  250 mL Intravenous Once Truitt Merle, MD        PHYSICAL EXAMINATION: ECOG PERFORMANCE STATUS: 2 - Symptomatic, <50% confined to bed  Vitals:   07/19/21 1105  BP: 115/68  Pulse: 99  Resp: 18  Temp: 98.1 F (36.7 C)  SpO2: 100%   Wt Readings from Last 3 Encounters:  07/19/21 144 lb 14.4 oz (65.7 kg)  07/12/21 141 lb 8 oz (64.2 kg)  06/21/21 139 lb 9.6 oz (63.3 kg)     GENERAL:alert, no distress and comfortable SKIN: skin color normal, no rashes or significant lesions EYES: normal, Conjunctiva are pink and non-injected, sclera clear  NEURO: alert & oriented x 3 with fluent speech  LABORATORY DATA:  I have reviewed the data as listed CBC Latest Ref Rng & Units 07/19/2021 07/12/2021 07/08/2021  WBC 4.0  - 10.5 K/uL 3.7(L) 6.1 8.5  Hemoglobin 12.0 - 15.0 g/dL 7.9(L) 8.3(L) 8.8(L)  Hematocrit 36.0 - 46.0 % 25.8(L) 26.5(L) 28.8(L)  Platelets 150 - 400 K/uL 163 90(L) 51(L)     CMP Latest Ref Rng & Units 07/19/2021 07/12/2021 07/08/2021  Glucose 70 - 99 mg/dL 222(H) 210(H) 221(H)  BUN 8 - 23 mg/dL $Remove'15 16 15  'fmYTnjg$ Creatinine 0.44 - 1.00 mg/dL 0.63 0.66 0.66  Sodium 135 - 145 mmol/L 139 139 139  Potassium 3.5 - 5.1 mmol/L 4.2 4.3 4.3  Chloride 98 - 111 mmol/L 107 106 107  CO2 22 - 32 mmol/L $RemoveB'27 28 27  'qJZhctrc$ Calcium 8.9 - 10.3 mg/dL 8.5(L) 8.7(L) 8.8(L)  Total Protein 6.5 - 8.1 g/dL 6.7 6.8 6.9  Total Bilirubin 0.3 - 1.2 mg/dL 0.8 0.7 0.6  Alkaline Phos 38 - 126 U/L 107 99 103  AST 15 - 41 U/L 11(L) 14(L) 12(L)  ALT 0 - 44 U/L $Remo'12 15 14      'HzXXr$ RADIOGRAPHIC STUDIES: I have personally reviewed the radiological images as listed and agreed with the findings in the report. No results found.    No orders of the defined types were placed in this encounter.  All questions were answered. The patient knows to call the clinic with any problems, questions or concerns. No barriers to learning was detected. The total time spent in the appointment was 30 minutes.     Truitt Merle, MD 07/19/2021   I, Wilburn Mylar, am acting as scribe for Truitt Merle, MD.   I have reviewed the above documentation for accuracy and completeness, and I agree with the above.

## 2021-07-20 ENCOUNTER — Inpatient Hospital Stay: Payer: Medicare HMO

## 2021-07-20 VITALS — BP 107/62 | HR 78 | Temp 98.6°F | Resp 16

## 2021-07-20 DIAGNOSIS — D693 Immune thrombocytopenic purpura: Secondary | ICD-10-CM | POA: Diagnosis not present

## 2021-07-20 DIAGNOSIS — C9310 Chronic myelomonocytic leukemia not having achieved remission: Secondary | ICD-10-CM

## 2021-07-20 DIAGNOSIS — R519 Headache, unspecified: Secondary | ICD-10-CM | POA: Diagnosis not present

## 2021-07-20 DIAGNOSIS — E538 Deficiency of other specified B group vitamins: Secondary | ICD-10-CM | POA: Diagnosis not present

## 2021-07-20 DIAGNOSIS — Z5111 Encounter for antineoplastic chemotherapy: Secondary | ICD-10-CM | POA: Diagnosis not present

## 2021-07-20 DIAGNOSIS — Z79899 Other long term (current) drug therapy: Secondary | ICD-10-CM | POA: Diagnosis not present

## 2021-07-20 DIAGNOSIS — I10 Essential (primary) hypertension: Secondary | ICD-10-CM | POA: Diagnosis not present

## 2021-07-20 DIAGNOSIS — E1142 Type 2 diabetes mellitus with diabetic polyneuropathy: Secondary | ICD-10-CM | POA: Diagnosis not present

## 2021-07-20 DIAGNOSIS — D649 Anemia, unspecified: Secondary | ICD-10-CM | POA: Diagnosis not present

## 2021-07-20 DIAGNOSIS — R69 Illness, unspecified: Secondary | ICD-10-CM | POA: Diagnosis not present

## 2021-07-20 MED ORDER — SODIUM CHLORIDE 0.9 % IV SOLN
10.0000 mg | Freq: Once | INTRAVENOUS | Status: AC
  Administered 2021-07-20: 10 mg via INTRAVENOUS
  Filled 2021-07-20: qty 10

## 2021-07-20 MED ORDER — SODIUM CHLORIDE 0.9 % IV SOLN: Freq: Once | INTRAVENOUS | Status: AC

## 2021-07-20 MED ORDER — SODIUM CHLORIDE 0.9% FLUSH
3.0000 mL | INTRAVENOUS | Status: DC | PRN
  Administered 2021-07-20: 10 mL

## 2021-07-20 MED ORDER — SODIUM CHLORIDE 0.9% IV SOLUTION
250.0000 mL | Freq: Once | INTRAVENOUS | Status: AC
  Administered 2021-07-20: 250 mL via INTRAVENOUS

## 2021-07-20 MED ORDER — HEPARIN SOD (PORK) LOCK FLUSH 100 UNIT/ML IV SOLN
250.0000 [IU] | Freq: Once | INTRAVENOUS | Status: AC | PRN
  Administered 2021-07-20: 250 [IU]

## 2021-07-20 MED ORDER — SODIUM CHLORIDE 0.9 % IV SOLN
75.0000 mg/m2 | Freq: Once | INTRAVENOUS | Status: AC
  Administered 2021-07-20: 135 mg via INTRAVENOUS
  Filled 2021-07-20: qty 13.5

## 2021-07-20 MED FILL — Dexamethasone Sodium Phosphate Inj 100 MG/10ML: INTRAMUSCULAR | Qty: 1 | Status: AC

## 2021-07-20 NOTE — Patient Instructions (Signed)
[facility]  Discharge Instructions: Thank you for choosing [facility] to provide your oncology and hematology care.   If you have a lab appointment with the Cancer Center, please go directly to the Cancer Center and check in at the registration area.   Wear comfortable clothing and clothing appropriate for easy access to any Portacath or PICC line.   We strive to give you quality time with your provider. You may need to reschedule your appointment if you arrive late (15 or more minutes).  Arriving late affects you and other patients whose appointments are after yours.  Also, if you miss three or more appointments without notifying the office, you may be dismissed from the clinic at the provider's discretion.      For prescription refill requests, have your pharmacy contact our office and allow 72 hours for refills to be completed.   Today you received the following chemotherapy and/or immunotherapy agents Vidaza     To help prevent nausea and vomiting after your treatment, we encourage you to take your nausea medication as directed.  BELOW ARE SYMPTOMS THAT SHOULD BE REPORTED IMMEDIATELY: *FEVER GREATER THAN 100.4 F (38 C) OR HIGHER *CHILLS OR SWEATING *NAUSEA AND VOMITING THAT IS NOT CONTROLLED WITH YOUR NAUSEA MEDICATION *UNUSUAL SHORTNESS OF BREATH *UNUSUAL BRUISING OR BLEEDING *URINARY PROBLEMS (pain or burning when urinating, or frequent urination) *BOWEL PROBLEMS (unusual diarrhea, constipation, pain near the anus) TENDERNESS IN MOUTH AND THROAT WITH OR WITHOUT PRESENCE OF ULCERS (sore throat, sores in mouth, or a toothache) UNUSUAL RASH, SWELLING OR PAIN  UNUSUAL VAGINAL DISCHARGE OR ITCHING   Items with * indicate a potential emergency and should be followed up as soon as possible or go to the Emergency Department if any problems should occur.  Please show the CHEMOTHERAPY ALERT CARD or IMMUNOTHERAPY ALERT CARD at check-in to the  Emergency Department and triage nurse.  Should you have questions after your visit or need to cancel or reschedule your appointment, please contact [facility]  Dept: [phone]  and follow the prompts.  Office hours are 8:00 a.m. to 4:30 p.m. Monday - Friday. Please note that voicemails left after 4:00 p.m. may not be returned until the following business day.  We are closed weekends and major holidays. You have access to a nurse at all times for urgent questions. Please call the main number to the clinic Dept: [phone] and follow the prompts.   For any non-urgent questions, you may also contact your provider using MyChart. We now offer e-Visits for anyone 18 and older to request care online for non-urgent symptoms. For details visit [email or web address].   Also download the MyChart app! Go to the app store, search "MyChart", open the app, select [facility], and log in with your MyChart username and password.  Due to Covid, a mask is required upon entering the hospital/clinic. If you do not have a mask, one will be given to you upon arrival. For doctor visits, patients may have 1 support person aged 18 or older with them. For treatment visits, patients cannot have anyone with them due to current Covid guidelines and our immunocompromised population.   

## 2021-07-21 ENCOUNTER — Inpatient Hospital Stay: Payer: Medicare HMO

## 2021-07-21 ENCOUNTER — Other Ambulatory Visit: Payer: Self-pay

## 2021-07-21 ENCOUNTER — Telehealth: Payer: Self-pay | Admitting: Hematology

## 2021-07-21 VITALS — BP 110/63 | HR 75 | Temp 98.3°F | Resp 16

## 2021-07-21 DIAGNOSIS — Z5111 Encounter for antineoplastic chemotherapy: Secondary | ICD-10-CM | POA: Diagnosis not present

## 2021-07-21 DIAGNOSIS — R69 Illness, unspecified: Secondary | ICD-10-CM | POA: Diagnosis not present

## 2021-07-21 DIAGNOSIS — C9310 Chronic myelomonocytic leukemia not having achieved remission: Secondary | ICD-10-CM

## 2021-07-21 DIAGNOSIS — E1142 Type 2 diabetes mellitus with diabetic polyneuropathy: Secondary | ICD-10-CM | POA: Diagnosis not present

## 2021-07-21 DIAGNOSIS — R519 Headache, unspecified: Secondary | ICD-10-CM | POA: Diagnosis not present

## 2021-07-21 DIAGNOSIS — I10 Essential (primary) hypertension: Secondary | ICD-10-CM | POA: Diagnosis not present

## 2021-07-21 DIAGNOSIS — Z452 Encounter for adjustment and management of vascular access device: Secondary | ICD-10-CM

## 2021-07-21 DIAGNOSIS — D649 Anemia, unspecified: Secondary | ICD-10-CM | POA: Diagnosis not present

## 2021-07-21 DIAGNOSIS — E538 Deficiency of other specified B group vitamins: Secondary | ICD-10-CM | POA: Diagnosis not present

## 2021-07-21 DIAGNOSIS — Z79899 Other long term (current) drug therapy: Secondary | ICD-10-CM | POA: Diagnosis not present

## 2021-07-21 DIAGNOSIS — D693 Immune thrombocytopenic purpura: Secondary | ICD-10-CM | POA: Diagnosis not present

## 2021-07-21 LAB — BPAM RBC
Blood Product Expiration Date: 202303112359
ISSUE DATE / TIME: 202302071113
Unit Type and Rh: 5100

## 2021-07-21 LAB — TYPE AND SCREEN
ABO/RH(D): O POS
Antibody Screen: NEGATIVE
Unit division: 0

## 2021-07-21 MED ORDER — HEPARIN SOD (PORK) LOCK FLUSH 100 UNIT/ML IV SOLN
250.0000 [IU] | Freq: Once | INTRAVENOUS | Status: AC | PRN
  Administered 2021-07-21: 250 [IU]

## 2021-07-21 MED ORDER — SODIUM CHLORIDE 0.9 % IV SOLN: Freq: Once | INTRAVENOUS | Status: AC

## 2021-07-21 MED ORDER — SODIUM CHLORIDE 0.9% FLUSH
10.0000 mL | Freq: Once | INTRAVENOUS | Status: AC
  Administered 2021-07-21: 10 mL via INTRAVENOUS

## 2021-07-21 MED ORDER — SODIUM CHLORIDE 0.9 % IV SOLN
75.0000 mg/m2 | Freq: Once | INTRAVENOUS | Status: AC
  Administered 2021-07-21: 135 mg via INTRAVENOUS
  Filled 2021-07-21: qty 13.5

## 2021-07-21 MED ORDER — SODIUM CHLORIDE 0.9% FLUSH
3.0000 mL | INTRAVENOUS | Status: DC | PRN
  Administered 2021-07-21: 3 mL

## 2021-07-21 MED ORDER — SODIUM CHLORIDE 0.9 % IV SOLN
10.0000 mg | Freq: Once | INTRAVENOUS | Status: AC
  Administered 2021-07-21: 10 mg via INTRAVENOUS
  Filled 2021-07-21: qty 10

## 2021-07-21 MED FILL — Dexamethasone Sodium Phosphate Inj 100 MG/10ML: INTRAMUSCULAR | Qty: 1 | Status: AC

## 2021-07-21 NOTE — Telephone Encounter (Signed)
Left message with follow-up appointments per 2/6 los. °

## 2021-07-21 NOTE — Patient Instructions (Signed)
[facility]  Discharge Instructions: Thank you for choosing [facility] to provide your oncology and hematology care.   If you have a lab appointment with the [facility], please go directly to the [facility] and check in at the registration area.   Wear comfortable clothing and clothing appropriate for easy access to any Portacath or PICC line.   We strive to give you quality time with your provider. You may need to reschedule your appointment if you arrive late (15 or more minutes).  Arriving late affects you and other patients whose appointments are after yours.  Also, if you miss three or more appointments without notifying the office, you may be dismissed from the clinic at the provider's discretion.      For prescription refill requests, have your pharmacy contact our office and allow 72 hours for refills to be completed.   Today you received the following chemotherapy and/or immunotherapy agents Vidaza     To help prevent nausea and vomiting after your treatment, we encourage you to take your nausea medication as directed.  BELOW ARE SYMPTOMS THAT SHOULD BE REPORTED IMMEDIATELY: *FEVER GREATER THAN 100.4 F (38 C) OR HIGHER *CHILLS OR SWEATING *NAUSEA AND VOMITING THAT IS NOT CONTROLLED WITH YOUR NAUSEA MEDICATION *UNUSUAL SHORTNESS OF BREATH *UNUSUAL BRUISING OR BLEEDING *URINARY PROBLEMS (pain or burning when urinating, or frequent urination) *BOWEL PROBLEMS (unusual diarrhea, constipation, pain near the anus) TENDERNESS IN MOUTH AND THROAT WITH OR WITHOUT PRESENCE OF ULCERS (sore throat, sores in mouth, or a toothache) UNUSUAL RASH, SWELLING OR PAIN  UNUSUAL VAGINAL DISCHARGE OR ITCHING   Items with * indicate a potential emergency and should be followed up as soon as possible or go to the Emergency Department if any problems should occur.  Please show the CHEMOTHERAPY ALERT CARD or IMMUNOTHERAPY ALERT CARD at check-in to the  Emergency Department and triage nurse.  Should you have questions after your visit or need to cancel or reschedule your appointment, please contact [facility]  Dept: [phone]  and follow the prompts.  Office hours are 8:00 a.m. to 4:30 p.m. Monday - Friday. Please note that voicemails left after 4:00 p.m. may not be returned until the following business day.  We are closed weekends and major holidays. You have access to a nurse at all times for urgent questions. Please call the main number to the clinic Dept: [phone] and follow the prompts.   For any non-urgent questions, you may also contact your provider using MyChart. We now offer e-Visits for anyone 18 and older to request care online for non-urgent symptoms. For details visit [email or web address].   Also download the MyChart app! Go to the app store, search "MyChart", open the app, select [facility], and log in with your MyChart username and password.  Due to Covid, a mask is required upon entering the hospital/clinic. If you do not have a mask, one will be given to you upon arrival. For doctor visits, patients may have 1 support person aged 18 or older with them. For treatment visits, patients cannot have anyone with them due to current Covid guidelines and our immunocompromised population.   

## 2021-07-22 ENCOUNTER — Inpatient Hospital Stay: Payer: Medicare HMO

## 2021-07-22 ENCOUNTER — Encounter: Payer: Self-pay | Admitting: Hematology

## 2021-07-22 ENCOUNTER — Other Ambulatory Visit: Payer: Self-pay

## 2021-07-22 VITALS — BP 129/61 | HR 85 | Temp 98.1°F | Resp 16

## 2021-07-22 DIAGNOSIS — D696 Thrombocytopenia, unspecified: Secondary | ICD-10-CM

## 2021-07-22 DIAGNOSIS — Z452 Encounter for adjustment and management of vascular access device: Secondary | ICD-10-CM

## 2021-07-22 DIAGNOSIS — Z5111 Encounter for antineoplastic chemotherapy: Secondary | ICD-10-CM | POA: Diagnosis not present

## 2021-07-22 DIAGNOSIS — I10 Essential (primary) hypertension: Secondary | ICD-10-CM | POA: Diagnosis not present

## 2021-07-22 DIAGNOSIS — C9310 Chronic myelomonocytic leukemia not having achieved remission: Secondary | ICD-10-CM

## 2021-07-22 DIAGNOSIS — R519 Headache, unspecified: Secondary | ICD-10-CM | POA: Diagnosis not present

## 2021-07-22 DIAGNOSIS — E1142 Type 2 diabetes mellitus with diabetic polyneuropathy: Secondary | ICD-10-CM | POA: Diagnosis not present

## 2021-07-22 DIAGNOSIS — E538 Deficiency of other specified B group vitamins: Secondary | ICD-10-CM | POA: Diagnosis not present

## 2021-07-22 DIAGNOSIS — D693 Immune thrombocytopenic purpura: Secondary | ICD-10-CM | POA: Diagnosis not present

## 2021-07-22 DIAGNOSIS — Z79899 Other long term (current) drug therapy: Secondary | ICD-10-CM | POA: Diagnosis not present

## 2021-07-22 DIAGNOSIS — R69 Illness, unspecified: Secondary | ICD-10-CM | POA: Diagnosis not present

## 2021-07-22 DIAGNOSIS — D649 Anemia, unspecified: Secondary | ICD-10-CM | POA: Diagnosis not present

## 2021-07-22 LAB — CBC WITH DIFFERENTIAL (CANCER CENTER ONLY)
Abs Immature Granulocytes: 0.04 10*3/uL (ref 0.00–0.07)
Basophils Absolute: 0.1 10*3/uL (ref 0.0–0.1)
Basophils Relative: 1 %
Eosinophils Absolute: 0 10*3/uL (ref 0.0–0.5)
Eosinophils Relative: 1 %
HCT: 27.6 % — ABNORMAL LOW (ref 36.0–46.0)
Hemoglobin: 9 g/dL — ABNORMAL LOW (ref 12.0–15.0)
Immature Granulocytes: 1 %
Lymphocytes Relative: 16 %
Lymphs Abs: 0.8 10*3/uL (ref 0.7–4.0)
MCH: 26.5 pg (ref 26.0–34.0)
MCHC: 32.6 g/dL (ref 30.0–36.0)
MCV: 81.4 fL (ref 80.0–100.0)
Monocytes Absolute: 0.6 10*3/uL (ref 0.1–1.0)
Monocytes Relative: 12 %
Neutro Abs: 3.6 10*3/uL (ref 1.7–7.7)
Neutrophils Relative %: 69 %
Platelet Count: 130 10*3/uL — ABNORMAL LOW (ref 150–400)
RBC: 3.39 MIL/uL — ABNORMAL LOW (ref 3.87–5.11)
RDW: 18.7 % — ABNORMAL HIGH (ref 11.5–15.5)
WBC Count: 5.2 10*3/uL (ref 4.0–10.5)
nRBC: 0 % (ref 0.0–0.2)

## 2021-07-22 LAB — CMP (CANCER CENTER ONLY)
ALT: 18 U/L (ref 0–44)
AST: 13 U/L — ABNORMAL LOW (ref 15–41)
Albumin: 3.5 g/dL (ref 3.5–5.0)
Alkaline Phosphatase: 111 U/L (ref 38–126)
Anion gap: 3 — ABNORMAL LOW (ref 5–15)
BUN: 14 mg/dL (ref 8–23)
CO2: 30 mmol/L (ref 22–32)
Calcium: 8.8 mg/dL — ABNORMAL LOW (ref 8.9–10.3)
Chloride: 105 mmol/L (ref 98–111)
Creatinine: 0.64 mg/dL (ref 0.44–1.00)
GFR, Estimated: >60 mL/min (ref >60)
Glucose, Bld: 144 mg/dL — ABNORMAL HIGH (ref 70–99)
Potassium: 3.8 mmol/L (ref 3.5–5.1)
Sodium: 138 mmol/L (ref 135–145)
Total Bilirubin: 0.7 mg/dL (ref 0.3–1.2)
Total Protein: 6.6 g/dL (ref 6.5–8.1)

## 2021-07-22 LAB — SAMPLE TO BLOOD BANK

## 2021-07-22 MED ORDER — SODIUM CHLORIDE 0.9 % IV SOLN
10.0000 mg | Freq: Once | INTRAVENOUS | Status: AC
  Administered 2021-07-22: 10 mg via INTRAVENOUS
  Filled 2021-07-22: qty 10

## 2021-07-22 MED ORDER — HEPARIN SOD (PORK) LOCK FLUSH 100 UNIT/ML IV SOLN
250.0000 [IU] | Freq: Once | INTRAVENOUS | Status: AC | PRN
  Administered 2021-07-22: 250 [IU]

## 2021-07-22 MED ORDER — SODIUM CHLORIDE 0.9 % IV SOLN
75.0000 mg/m2 | Freq: Once | INTRAVENOUS | Status: AC
  Administered 2021-07-22: 135 mg via INTRAVENOUS
  Filled 2021-07-22: qty 13.5

## 2021-07-22 MED ORDER — SODIUM CHLORIDE 0.9 % IV SOLN: Freq: Once | INTRAVENOUS | Status: AC

## 2021-07-22 MED ORDER — HEPARIN SOD (PORK) LOCK FLUSH 100 UNIT/ML IV SOLN
500.0000 [IU] | Freq: Once | INTRAVENOUS | Status: AC
  Administered 2021-07-22: 500 [IU] via INTRAVENOUS

## 2021-07-22 MED ORDER — SODIUM CHLORIDE 0.9% FLUSH
10.0000 mL | INTRAVENOUS | Status: DC | PRN
  Administered 2021-07-22: 10 mL

## 2021-07-22 MED ORDER — SODIUM CHLORIDE 0.9% FLUSH
10.0000 mL | Freq: Once | INTRAVENOUS | Status: AC
  Administered 2021-07-22: 10 mL via INTRAVENOUS

## 2021-07-22 MED FILL — Dexamethasone Sodium Phosphate Inj 100 MG/10ML: INTRAMUSCULAR | Qty: 1 | Status: AC

## 2021-07-22 NOTE — Patient Instructions (Signed)
[facility]  Discharge Instructions: Thank you for choosing [facility] to provide your oncology and hematology care.   If you have a lab appointment with the Cancer Center, please go directly to the Cancer Center and check in at the registration area.   Wear comfortable clothing and clothing appropriate for easy access to any Portacath or PICC line.   We strive to give you quality time with your provider. You may need to reschedule your appointment if you arrive late (15 or more minutes).  Arriving late affects you and other patients whose appointments are after yours.  Also, if you miss three or more appointments without notifying the office, you may be dismissed from the clinic at the provider's discretion.      For prescription refill requests, have your pharmacy contact our office and allow 72 hours for refills to be completed.   Today you received the following chemotherapy and/or immunotherapy agents Vidaza     To help prevent nausea and vomiting after your treatment, we encourage you to take your nausea medication as directed.  BELOW ARE SYMPTOMS THAT SHOULD BE REPORTED IMMEDIATELY: *FEVER GREATER THAN 100.4 F (38 C) OR HIGHER *CHILLS OR SWEATING *NAUSEA AND VOMITING THAT IS NOT CONTROLLED WITH YOUR NAUSEA MEDICATION *UNUSUAL SHORTNESS OF BREATH *UNUSUAL BRUISING OR BLEEDING *URINARY PROBLEMS (pain or burning when urinating, or frequent urination) *BOWEL PROBLEMS (unusual diarrhea, constipation, pain near the anus) TENDERNESS IN MOUTH AND THROAT WITH OR WITHOUT PRESENCE OF ULCERS (sore throat, sores in mouth, or a toothache) UNUSUAL RASH, SWELLING OR PAIN  UNUSUAL VAGINAL DISCHARGE OR ITCHING   Items with * indicate a potential emergency and should be followed up as soon as possible or go to the Emergency Department if any problems should occur.  Please show the CHEMOTHERAPY ALERT CARD or IMMUNOTHERAPY ALERT CARD at check-in to the  Emergency Department and triage nurse.  Should you have questions after your visit or need to cancel or reschedule your appointment, please contact [facility]  Dept: [phone]  and follow the prompts.  Office hours are 8:00 a.m. to 4:30 p.m. Monday - Friday. Please note that voicemails left after 4:00 p.m. may not be returned until the following business day.  We are closed weekends and major holidays. You have access to a nurse at all times for urgent questions. Please call the main number to the clinic Dept: [phone] and follow the prompts.   For any non-urgent questions, you may also contact your provider using MyChart. We now offer e-Visits for anyone 18 and older to request care online for non-urgent symptoms. For details visit [email or web address].   Also download the MyChart app! Go to the app store, search "MyChart", open the app, select [facility], and log in with your MyChart username and password.  Due to Covid, a mask is required upon entering the hospital/clinic. If you do not have a mask, one will be given to you upon arrival. For doctor visits, patients may have 1 support person aged 18 or older with them. For treatment visits, patients cannot have anyone with them due to current Covid guidelines and our immunocompromised population.   

## 2021-07-23 ENCOUNTER — Other Ambulatory Visit: Payer: Self-pay | Admitting: Radiology

## 2021-07-23 ENCOUNTER — Inpatient Hospital Stay: Payer: Medicare HMO

## 2021-07-23 ENCOUNTER — Other Ambulatory Visit: Payer: Self-pay

## 2021-07-23 ENCOUNTER — Other Ambulatory Visit (HOSPITAL_COMMUNITY): Payer: Self-pay | Admitting: Physician Assistant

## 2021-07-23 VITALS — BP 121/60 | HR 77 | Temp 98.7°F | Resp 16

## 2021-07-23 DIAGNOSIS — Z5111 Encounter for antineoplastic chemotherapy: Secondary | ICD-10-CM | POA: Diagnosis not present

## 2021-07-23 DIAGNOSIS — C9310 Chronic myelomonocytic leukemia not having achieved remission: Secondary | ICD-10-CM

## 2021-07-23 DIAGNOSIS — E538 Deficiency of other specified B group vitamins: Secondary | ICD-10-CM | POA: Diagnosis not present

## 2021-07-23 DIAGNOSIS — Z79899 Other long term (current) drug therapy: Secondary | ICD-10-CM | POA: Diagnosis not present

## 2021-07-23 DIAGNOSIS — I10 Essential (primary) hypertension: Secondary | ICD-10-CM | POA: Diagnosis not present

## 2021-07-23 DIAGNOSIS — E1142 Type 2 diabetes mellitus with diabetic polyneuropathy: Secondary | ICD-10-CM | POA: Diagnosis not present

## 2021-07-23 DIAGNOSIS — R519 Headache, unspecified: Secondary | ICD-10-CM | POA: Diagnosis not present

## 2021-07-23 DIAGNOSIS — R69 Illness, unspecified: Secondary | ICD-10-CM | POA: Diagnosis not present

## 2021-07-23 DIAGNOSIS — D649 Anemia, unspecified: Secondary | ICD-10-CM | POA: Diagnosis not present

## 2021-07-23 DIAGNOSIS — D693 Immune thrombocytopenic purpura: Secondary | ICD-10-CM | POA: Diagnosis not present

## 2021-07-23 MED ORDER — SODIUM CHLORIDE 0.9 % IV SOLN
75.0000 mg/m2 | Freq: Once | INTRAVENOUS | Status: AC
  Administered 2021-07-23: 135 mg via INTRAVENOUS
  Filled 2021-07-23: qty 13.5

## 2021-07-23 MED ORDER — HEPARIN SOD (PORK) LOCK FLUSH 100 UNIT/ML IV SOLN
250.0000 [IU] | Freq: Once | INTRAVENOUS | Status: AC | PRN
  Administered 2021-07-23: 250 [IU]

## 2021-07-23 MED ORDER — SODIUM CHLORIDE 0.9% FLUSH
10.0000 mL | INTRAVENOUS | Status: DC | PRN
  Administered 2021-07-23: 10 mL

## 2021-07-23 MED ORDER — SODIUM CHLORIDE 0.9 % IV SOLN
10.0000 mg | Freq: Once | INTRAVENOUS | Status: AC
  Administered 2021-07-23: 10 mg via INTRAVENOUS
  Filled 2021-07-23: qty 10

## 2021-07-23 MED ORDER — PALONOSETRON HCL INJECTION 0.25 MG/5ML
0.2500 mg | Freq: Once | INTRAVENOUS | Status: AC
  Administered 2021-07-23: 0.25 mg via INTRAVENOUS
  Filled 2021-07-23: qty 5

## 2021-07-23 MED ORDER — SODIUM CHLORIDE 0.9 % IV SOLN: Freq: Once | INTRAVENOUS | Status: AC

## 2021-07-23 NOTE — Patient Instructions (Signed)
[facility]   Discharge Instructions: Thank you for choosing [facility] to provide your oncology and hematology care.   If you have a lab appointment with the Cancer Center, please go directly to the Cancer Center and check in at the registration area.   Wear comfortable clothing and clothing appropriate for easy access to any Portacath or PICC line.   We strive to give you quality time with your provider. You may need to reschedule your appointment if you arrive late (15 or more minutes).  Arriving late affects you and other patients whose appointments are after yours.  Also, if you miss three or more appointments without notifying the office, you may be dismissed from the clinic at the provider's discretion.      For prescription refill requests, have your pharmacy contact our office and allow 72 hours for refills to be completed.    Today you received the following chemotherapy and/or immunotherapy agents: azacitadine      To help prevent nausea and vomiting after your treatment, we encourage you to take your nausea medication as directed.  BELOW ARE SYMPTOMS THAT SHOULD BE REPORTED IMMEDIATELY: *FEVER GREATER THAN 100.4 F (38 C) OR HIGHER *CHILLS OR SWEATING *NAUSEA AND VOMITING THAT IS NOT CONTROLLED WITH YOUR NAUSEA MEDICATION *UNUSUAL SHORTNESS OF BREATH *UNUSUAL BRUISING OR BLEEDING *URINARY PROBLEMS (pain or burning when urinating, or frequent urination) *BOWEL PROBLEMS (unusual diarrhea, constipation, pain near the anus) TENDERNESS IN MOUTH AND THROAT WITH OR WITHOUT PRESENCE OF ULCERS (sore throat, sores in mouth, or a toothache) UNUSUAL RASH, SWELLING OR PAIN  UNUSUAL VAGINAL DISCHARGE OR ITCHING   Items with * indicate a potential emergency and should be followed up as soon as possible or go to the Emergency Department if any problems should occur.  Please show the CHEMOTHERAPY ALERT CARD or IMMUNOTHERAPY ALERT CARD at check-in  to the Emergency Department and triage nurse.  Should you have questions after your visit or need to cancel or reschedule your appointment, please contact [facility]  Dept: [phone]  and follow the prompts.  Office hours are 8:00 a.m. to 4:30 p.m. Monday - Friday. Please note that voicemails left after 4:00 p.m. may not be returned until the following business day.  We are closed weekends and major holidays. You have access to a nurse at all times for urgent questions. Please call the main number to the clinic Dept: [phone] and follow the prompts.   For any non-urgent questions, you may also contact your provider using MyChart. We now offer e-Visits for anyone 18 and older to request care online for non-urgent symptoms. For details visit [email or web address].   Also download the MyChart app! Go to the app store, search "MyChart", open the app, select [facility], and log in with your MyChart username and password.  Due to Covid, a mask is required upon entering the hospital/clinic. If you do not have a mask, one will be given to you upon arrival. For doctor visits, patients may have 1 support person aged 18 or older with them. For treatment visits, patients cannot have anyone with them due to current Covid guidelines and our immunocompromised population.   

## 2021-07-26 ENCOUNTER — Ambulatory Visit (HOSPITAL_COMMUNITY)
Admission: RE | Admit: 2021-07-26 | Discharge: 2021-07-26 | Disposition: A | Discharge status: Home / Self Care | Payer: Medicare HMO | Source: Ambulatory Visit | Attending: Hematology | Admitting: Hematology

## 2021-07-26 ENCOUNTER — Encounter (HOSPITAL_COMMUNITY): Payer: Self-pay

## 2021-07-26 ENCOUNTER — Inpatient Hospital Stay: Payer: Medicare HMO

## 2021-07-26 ENCOUNTER — Other Ambulatory Visit: Payer: Self-pay

## 2021-07-26 ENCOUNTER — Other Ambulatory Visit: Payer: Self-pay | Admitting: Hematology

## 2021-07-26 VITALS — BP 119/79 | HR 91 | Temp 98.5°F | Resp 16 | Wt 138.5 lb

## 2021-07-26 DIAGNOSIS — E785 Hyperlipidemia, unspecified: Secondary | ICD-10-CM | POA: Insufficient documentation

## 2021-07-26 DIAGNOSIS — E119 Type 2 diabetes mellitus without complications: Secondary | ICD-10-CM | POA: Diagnosis not present

## 2021-07-26 DIAGNOSIS — I10 Essential (primary) hypertension: Secondary | ICD-10-CM | POA: Diagnosis not present

## 2021-07-26 DIAGNOSIS — D696 Thrombocytopenia, unspecified: Secondary | ICD-10-CM

## 2021-07-26 DIAGNOSIS — C9310 Chronic myelomonocytic leukemia not having achieved remission: Secondary | ICD-10-CM | POA: Diagnosis present

## 2021-07-26 DIAGNOSIS — Z7984 Long term (current) use of oral hypoglycemic drugs: Secondary | ICD-10-CM | POA: Insufficient documentation

## 2021-07-26 DIAGNOSIS — Z452 Encounter for adjustment and management of vascular access device: Secondary | ICD-10-CM | POA: Diagnosis not present

## 2021-07-26 HISTORY — PX: IR IMAGING GUIDED PORT INSERTION: IMG5740

## 2021-07-26 LAB — CBC WITH DIFFERENTIAL (CANCER CENTER ONLY)
Abs Immature Granulocytes: 0.05 10*3/uL (ref 0.00–0.07)
Basophils Absolute: 0.1 10*3/uL (ref 0.0–0.1)
Basophils Relative: 2 %
Eosinophils Absolute: 0.1 10*3/uL (ref 0.0–0.5)
Eosinophils Relative: 2 %
HCT: 31.2 % — ABNORMAL LOW (ref 36.0–46.0)
Hemoglobin: 9.7 g/dL — ABNORMAL LOW (ref 12.0–15.0)
Immature Granulocytes: 1 %
Lymphocytes Relative: 14 %
Lymphs Abs: 0.6 10*3/uL — ABNORMAL LOW (ref 0.7–4.0)
MCH: 25.3 pg — ABNORMAL LOW (ref 26.0–34.0)
MCHC: 31.1 g/dL (ref 30.0–36.0)
MCV: 81.5 fL (ref 80.0–100.0)
Monocytes Absolute: 0.4 10*3/uL (ref 0.1–1.0)
Monocytes Relative: 10 %
Neutro Abs: 3.1 10*3/uL (ref 1.7–7.7)
Neutrophils Relative %: 71 %
Platelet Count: 77 10*3/uL — ABNORMAL LOW (ref 150–400)
RBC: 3.83 MIL/uL — ABNORMAL LOW (ref 3.87–5.11)
RDW: 18.9 % — ABNORMAL HIGH (ref 11.5–15.5)
WBC Count: 4.4 10*3/uL (ref 4.0–10.5)
nRBC: 0 % (ref 0.0–0.2)

## 2021-07-26 LAB — SAMPLE TO BLOOD BANK

## 2021-07-26 LAB — GLUCOSE, CAPILLARY: Glucose-Capillary: 109 mg/dL — ABNORMAL HIGH (ref 70–99)

## 2021-07-26 MED ORDER — LIDOCAINE-EPINEPHRINE 1 %-1:100000 IJ SOLN
INTRAMUSCULAR | Status: AC | PRN
  Administered 2021-07-26: 20 mL

## 2021-07-26 MED ORDER — FENTANYL CITRATE (PF) 100 MCG/2ML IJ SOLN
INTRAMUSCULAR | Status: AC | PRN
  Administered 2021-07-26 (×2): 50 ug via INTRAVENOUS

## 2021-07-26 MED ORDER — SODIUM CHLORIDE 0.9 % IV SOLN: INTRAVENOUS | Status: DC

## 2021-07-26 MED ORDER — LIDOCAINE-EPINEPHRINE 1 %-1:100000 IJ SOLN
INTRAMUSCULAR | Status: AC
  Filled 2021-07-26: qty 1

## 2021-07-26 MED ORDER — ROMIPLOSTIM INJECTION 500 MCG
10.0000 ug/kg | Freq: Once | SUBCUTANEOUS | Status: AC
  Administered 2021-07-26: 655 ug via SUBCUTANEOUS
  Filled 2021-07-26: qty 1

## 2021-07-26 MED ORDER — MIDAZOLAM HCL 2 MG/2ML IJ SOLN
INTRAMUSCULAR | Status: AC
  Filled 2021-07-26: qty 2

## 2021-07-26 MED ORDER — MIDAZOLAM HCL 2 MG/2ML IJ SOLN
INTRAMUSCULAR | Status: AC | PRN
  Administered 2021-07-26 (×2): 1 mg via INTRAVENOUS

## 2021-07-26 MED ORDER — FENTANYL CITRATE (PF) 100 MCG/2ML IJ SOLN
INTRAMUSCULAR | Status: AC
  Filled 2021-07-26: qty 2

## 2021-07-26 MED ORDER — LIDOCAINE-EPINEPHRINE (PF) 1 %-1:200000 IJ SOLN: INTRAMUSCULAR | Status: AC | PRN

## 2021-07-26 MED ORDER — HEPARIN SOD (PORK) LOCK FLUSH 100 UNIT/ML IV SOLN
500.0000 [IU] | INTRAVENOUS | Status: AC | PRN
  Administered 2021-07-26: 500 [IU]
  Filled 2021-07-26: qty 5

## 2021-07-26 NOTE — Progress Notes (Signed)
Nplate increased to 10 mcg/kg per MD request for plt 77.  Benn Moulder, PharmD Pharmacy Resident  07/26/2021 9:42 AM

## 2021-07-26 NOTE — Progress Notes (Signed)
Dr. Burr Medico aware that Plt 77 and verbal order received that patient does not need a plt transfusion prior to port placement later on today.

## 2021-07-26 NOTE — Patient Instructions (Signed)
[facility]  Discharge Instructions: Thank you for choosing [facility] to provide your oncology and hematology care.   If you have a lab appointment with the [facility], please go directly to the [facility] and check in at the registration area.   Wear comfortable clothing and clothing appropriate for easy access to any Portacath or PICC line.   We strive to give you quality time with your provider. You may need to reschedule your appointment if you arrive late (15 or more minutes).  Arriving late affects you and other patients whose appointments are after yours.  Also, if you miss three or more appointments without notifying the office, you may be dismissed from the clinic at the providers discretion.      For prescription refill requests, have your pharmacy contact our office and allow 72 hours for refills to be completed.    Today you received the following chemotherapy and/or immunotherapy agents: Nplate      To help prevent nausea and vomiting after your treatment, we encourage you to take your nausea medication as directed.  BELOW ARE SYMPTOMS THAT SHOULD BE REPORTED IMMEDIATELY: *FEVER GREATER THAN 100.4 F (38 C) OR HIGHER *CHILLS OR SWEATING *NAUSEA AND VOMITING THAT IS NOT CONTROLLED WITH YOUR NAUSEA MEDICATION *UNUSUAL SHORTNESS OF BREATH *UNUSUAL BRUISING OR BLEEDING *URINARY PROBLEMS (pain or burning when urinating, or frequent urination) *BOWEL PROBLEMS (unusual diarrhea, constipation, pain near the anus) TENDERNESS IN MOUTH AND THROAT WITH OR WITHOUT PRESENCE OF ULCERS (sore throat, sores in mouth, or a toothache) UNUSUAL RASH, SWELLING OR PAIN  UNUSUAL VAGINAL DISCHARGE OR ITCHING   Items with * indicate a potential emergency and should be followed up as soon as possible or go to the Emergency Department if any problems should occur.  Please show the CHEMOTHERAPY ALERT CARD or IMMUNOTHERAPY ALERT CARD at check-in to the  Emergency Department and triage nurse.  Should you have questions after your visit or need to cancel or reschedule your appointment, please contact [facility]  Dept: [phone]  and follow the prompts.  Office hours are 8:00 a.m. to 4:30 p.m. Monday - Friday. Please note that voicemails left after 4:00 p.m. may not be returned until the following business day.  We are closed weekends and major holidays. You have access to a nurse at all times for urgent questions. Please call the main number to the clinic Dept: [phone] and follow the prompts.   For any non-urgent questions, you may also contact your provider using MyChart. We now offer e-Visits for anyone 9 and older to request care online for non-urgent symptoms. For details visit [email or web address].   Also download the MyChart app! Go to the app store, search "MyChart", open the app, select [facility], and log in with your MyChart username and password.  Due to Covid, a mask is required upon entering the hospital/clinic. If you do not have a mask, one will be given to you upon arrival. For doctor visits, patients may have 1 support person aged 43 or older with them. For treatment visits, patients cannot have anyone with them due to current Covid guidelines and our immunocompromised population.

## 2021-07-26 NOTE — Procedures (Signed)
Interventional Radiology Procedure Note ° °Procedure: Single Lumen Power Port Placement   ° °Access:  Right internal jugular vein ° °Findings: Catheter tip positioned at cavoatrial junction. Port is ready for immediate use.  ° °Complications: None ° °EBL: < 10 mL ° °Recommendations:  °- Ok to shower in 24 hours °- Do not submerge for 7 days °- Routine line care  ° ° °Tityana Pagan, MD °Pager: [phone] ° ° ° °

## 2021-07-26 NOTE — H&P (Signed)
Referring Physician(s): Feng,Yan  Supervising Physician: [facility]  Patient Status:  [facility] OP  Chief Complaint:  "I'm getting a port a cath"  Subjective: Pt known to IR service from BM bx in 2022, PICC placement in 2022 and T10/12 bx/RFA /KP in 2022. She has a hx of [name] and thrombocytopenia.  Latest plt count today is 77k. She also has hx poor venous access and with adequate plts now she presents today for port a cath placement to assist with blood transfusions and chemotherapy administration. She denies fever,HA,CP, dyspnea, cough, abd/back pain,N/V or bleeding. Additional med hx as below.   Past Medical History:  Diagnosis Date   Diabetes ([place])    HLD (hyperlipidemia)    Hypertension    Pernicious anemia    Pneumonia 09/05/2019   Renal insufficiency 09/06/2019   Vitamin D deficiency    Past Surgical History:  Procedure Laterality Date   ABDOMINAL HERNIA REPAIR  2009   ABDOMINOPLASTY     AXILLARY LYMPH NODE BIOPSY Left 11/11/2020   Procedure: LEFT AXILLARY EXCISIONAL LYMPH NODE BIOPSY;  Surgeon: [name], MD;  Location: [facility];  Service: General;  Laterality: Left;   [place]   hemorroid surgery  2007   IR KYPHO EA ADDL LEVEL THORACIC OR LUMBAR  05/31/2021   IR KYPHO THORACIC WITH BONE BIOPSY  05/31/2021   TONSILLECTOMY AND ADENOIDECTOMY  1957     Allergies: Asa [aspirin] and Nsaids  Medications: Prior to Admission medications   Medication Sig Start Date End Date Taking? Authorizing Provider  LORazepam (ATIVAN) 1 MG tablet TAKE 1 TABLET BY MOUTH EVERY 8 HOURS AS NEEDED FOR ANXIETY 06/19/21  Yes Truitt Merle, MD  metoprolol succinate (TOPROL-XL) 25 MG 24 hr tablet Take 1 tablet (25 mg total) by mouth daily. 03/26/21  Yes Kuneff, Renee A, DO  Oxycodone HCl 10 MG TABS 1 tab every 4-6 hr prn for chronic pain Patient taking differently: Take 10 mg by mouth every 4 (four) hours as needed (chronic pain). 1 tab every 4-6 hr prn for  chronic pain 05/17/21  Yes Kuneff, Renee A, DO  polyethylene glycol powder (GLYCOLAX/MIRALAX) 17 GM/SCOOP powder Dissolve 1 capful (17 g) in water and drink 2 (two) times daily. Patient taking differently: Take 17 g by mouth daily. 03/26/21  Yes Kuneff, Renee A, DO  sitaGLIPtin-metformin (JANUMET) 50-1000 MG tablet Take 1 tablet by mouth at bedtime. 03/26/21  Yes Kuneff, Renee A, DO  acetaminophen (TYLENOL) 500 MG tablet Take 2 tablets (1,000 mg total) by mouth every 8 (eight) hours. 03/12/21   [name], [name], MD  ammonium lactate (LAC-HYDRIN) 12 % lotion Apply 1 application topically 2 (two) times daily as needed for dry skin. 03/23/21   [provider]  bisacodyl (DULCOLAX) 10 MG suppository Place 1 suppository (10 mg total) rectally daily as needed for moderate constipation (Please take-if no bowel movement in 1-2 days with MiraLAX/senna). 03/26/21   Kuneff, Renee A, DO  cyanocobalamin (,VITAMIN B-12,) 1000 MCG/ML injection Inject 1,000 mcg into the muscle every 30 (thirty) days.    [provider]  gabapentin (NEURONTIN) 300 MG capsule Take 2 capsules (600 mg total) by mouth 2 (two) times daily. 12/22/20   Kuneff, Renee A, DO  glucose blood (ONETOUCH VERIO) test strip Use as instructed 07/13/21   Raoul Pitch, Renee A, DO  Lancets (ONETOUCH ULTRASOFT) lancets Use as instructed 07/13/21   Kuneff, Renee A, DO  naloxone (NARCAN) nasal spray 4  mg/0.1 mL Place 1 spray into the nose once as needed (overdose). 03/12/21   [name], [name], MD  ondansetron (ZOFRAN-ODT) 4 MG disintegrating tablet Take 4 mg by mouth every 8 (eight) hours as needed for nausea or vomiting. 05/20/21   [provider]  PARoxetine (PAXIL) 10 MG tablet Take 10 mg by mouth every morning. 05/20/21   [provider]  senna-docusate (SENOKOT-S) 8.6-50 MG tablet Take 2 tablets by mouth at bedtime. Patient taking differently: Take 2 tablets by mouth at bedtime as needed for mild constipation. 03/26/21    Kuneff, Renee A, DO     Vital Signs: BP 128/81    Pulse (!) 102    Temp (!) 97.5 F (36.4 C) (Oral)    Resp 20    SpO2 99%   Physical Exam awake/alert; chest- CTA bilat; heart- RRR; abd- soft,+BS,NT; no LE edema; RUE PICC in place  Imaging: No results found.  Labs:  CBC: Recent Labs    07/12/21 0841 07/19/21 1042 07/22/21 0855 07/26/21 0916  WBC 6.1 3.7* 5.2 4.4  HGB 8.3* 7.9* 9.0* 9.7*  HCT 26.5* 25.8* 27.6* 31.2*  PLT 90* 163 130* 77*    COAGS: Recent Labs    10/28/20 1806 05/31/21 2132  INR 1.2 1.4*    BMP: Recent Labs    07/08/21 0906 07/12/21 0847 07/19/21 1051 07/22/21 0855  NA 139 139 139 138  K 4.3 4.3 4.2 3.8  CL 107 106 107 105  CO2 27 28 27 30   GLUCOSE 221* 210* 222* 144*  BUN 15 16 15 14   CALCIUM 8.8* 8.7* 8.5* 8.8*  CREATININE 0.66 0.66 0.63 0.64  GFRNONAA >60 >60 >60 >60    LIVER FUNCTION TESTS: Recent Labs    07/08/21 0906 07/12/21 0847 07/19/21 1051 07/22/21 0855  BILITOT 0.6 0.7 0.8 0.7  AST 12* 14* 11* 13*  ALT 14 15 12 18   ALKPHOS 103 99 107 111  PROT 6.9 6.8 6.7 6.6  ALBUMIN 3.7 3.6 3.5 3.5    Assessment and Plan: Pt known to IR service from BM bx in 2022, PICC placement in 2022 and T10/12 bx/RFA /KP in 2022. She has a hx of [name] and thrombocytopenia.  Latest plt count today is 77k. She also has hx poor venous access and with adequate plts now she presents today for port a cath placement to assist with blood transfusions and chemotherapy administration.Risks and benefits of image guided port-a-catheter placement was discussed with the patient including, but not limited to bleeding, infection, pneumothorax, or fibrin sheath development and need for additional procedures.  All of the patient's questions were answered, patient is agreeable to proceed. Consent signed and in chart.    Electronically Signed: D. Rowe Robert, PA-C 07/26/2021, 1:33 PM   I spent a total of 25 minutes at the the patient's bedside AND on the  patient's hospital floor or unit, greater than 50% of which was counseling/coordinating care for port a cath placement

## 2021-07-28 DIAGNOSIS — F4323 Adjustment disorder with mixed anxiety and depressed mood: Secondary | ICD-10-CM | POA: Diagnosis not present

## 2021-07-28 DIAGNOSIS — R69 Illness, unspecified: Secondary | ICD-10-CM | POA: Diagnosis not present

## 2021-07-28 DIAGNOSIS — F411 Generalized anxiety disorder: Secondary | ICD-10-CM | POA: Diagnosis not present

## 2021-07-28 DIAGNOSIS — F5101 Primary insomnia: Secondary | ICD-10-CM | POA: Diagnosis not present

## 2021-07-29 ENCOUNTER — Inpatient Hospital Stay: Payer: Medicare HMO

## 2021-07-29 ENCOUNTER — Other Ambulatory Visit: Payer: Self-pay

## 2021-07-29 DIAGNOSIS — D693 Immune thrombocytopenic purpura: Secondary | ICD-10-CM | POA: Diagnosis not present

## 2021-07-29 DIAGNOSIS — D649 Anemia, unspecified: Secondary | ICD-10-CM | POA: Diagnosis not present

## 2021-07-29 DIAGNOSIS — Z79899 Other long term (current) drug therapy: Secondary | ICD-10-CM | POA: Diagnosis not present

## 2021-07-29 DIAGNOSIS — Z5111 Encounter for antineoplastic chemotherapy: Secondary | ICD-10-CM | POA: Diagnosis not present

## 2021-07-29 DIAGNOSIS — I10 Essential (primary) hypertension: Secondary | ICD-10-CM | POA: Diagnosis not present

## 2021-07-29 DIAGNOSIS — Z95828 Presence of other vascular implants and grafts: Secondary | ICD-10-CM

## 2021-07-29 DIAGNOSIS — E538 Deficiency of other specified B group vitamins: Secondary | ICD-10-CM | POA: Diagnosis not present

## 2021-07-29 DIAGNOSIS — R519 Headache, unspecified: Secondary | ICD-10-CM | POA: Diagnosis not present

## 2021-07-29 DIAGNOSIS — C9310 Chronic myelomonocytic leukemia not having achieved remission: Secondary | ICD-10-CM | POA: Diagnosis not present

## 2021-07-29 DIAGNOSIS — R69 Illness, unspecified: Secondary | ICD-10-CM | POA: Diagnosis not present

## 2021-07-29 DIAGNOSIS — E1142 Type 2 diabetes mellitus with diabetic polyneuropathy: Secondary | ICD-10-CM | POA: Diagnosis not present

## 2021-07-29 DIAGNOSIS — D696 Thrombocytopenia, unspecified: Secondary | ICD-10-CM

## 2021-07-29 LAB — CBC WITH DIFFERENTIAL (CANCER CENTER ONLY)
Abs Immature Granulocytes: 0.06 10*3/uL (ref 0.00–0.07)
Basophils Absolute: 0.1 10*3/uL (ref 0.0–0.1)
Basophils Relative: 1 %
Eosinophils Absolute: 0.1 10*3/uL (ref 0.0–0.5)
Eosinophils Relative: 3 %
HCT: 30.7 % — ABNORMAL LOW (ref 36.0–46.0)
Hemoglobin: 9.6 g/dL — ABNORMAL LOW (ref 12.0–15.0)
Immature Granulocytes: 1 %
Lymphocytes Relative: 13 %
Lymphs Abs: 0.7 10*3/uL (ref 0.7–4.0)
MCH: 25.6 pg — ABNORMAL LOW (ref 26.0–34.0)
MCHC: 31.3 g/dL (ref 30.0–36.0)
MCV: 81.9 fL (ref 80.0–100.0)
Monocytes Absolute: 0.4 10*3/uL (ref 0.1–1.0)
Monocytes Relative: 8 %
Neutro Abs: 3.9 10*3/uL (ref 1.7–7.7)
Neutrophils Relative %: 74 %
Platelet Count: 59 10*3/uL — ABNORMAL LOW (ref 150–400)
RBC: 3.75 MIL/uL — ABNORMAL LOW (ref 3.87–5.11)
RDW: 19.5 % — ABNORMAL HIGH (ref 11.5–15.5)
WBC Count: 5.2 10*3/uL (ref 4.0–10.5)
nRBC: 0 % (ref 0.0–0.2)

## 2021-07-29 LAB — SAMPLE TO BLOOD BANK

## 2021-07-29 MED ORDER — SODIUM CHLORIDE 0.9% FLUSH
10.0000 mL | Freq: Once | INTRAVENOUS | Status: AC
  Administered 2021-07-29: 10 mL via INTRAVENOUS

## 2021-07-29 MED ORDER — HEPARIN SOD (PORK) LOCK FLUSH 100 UNIT/ML IV SOLN
500.0000 [IU] | Freq: Once | INTRAVENOUS | Status: AC
  Administered 2021-07-29: 500 [IU] via INTRAVENOUS

## 2021-07-29 MED ORDER — SODIUM CHLORIDE 0.9% FLUSH
10.0000 mL | INTRAVENOUS | Status: AC | PRN
  Administered 2021-07-29: 10 mL

## 2021-07-29 MED ORDER — LIDOCAINE-PRILOCAINE 2.5-2.5 % EX CREA: 1.0000 "application " | TOPICAL_CREAM | CUTANEOUS | 1 refills | Status: DC | PRN

## 2021-08-02 ENCOUNTER — Inpatient Hospital Stay: Payer: Medicare HMO

## 2021-08-02 ENCOUNTER — Other Ambulatory Visit: Payer: Self-pay

## 2021-08-02 VITALS — BP 105/56 | HR 86 | Temp 98.2°F | Resp 18

## 2021-08-02 DIAGNOSIS — Z79899 Other long term (current) drug therapy: Secondary | ICD-10-CM | POA: Diagnosis not present

## 2021-08-02 DIAGNOSIS — E1142 Type 2 diabetes mellitus with diabetic polyneuropathy: Secondary | ICD-10-CM | POA: Diagnosis not present

## 2021-08-02 DIAGNOSIS — E538 Deficiency of other specified B group vitamins: Secondary | ICD-10-CM | POA: Diagnosis not present

## 2021-08-02 DIAGNOSIS — D693 Immune thrombocytopenic purpura: Secondary | ICD-10-CM | POA: Diagnosis not present

## 2021-08-02 DIAGNOSIS — R519 Headache, unspecified: Secondary | ICD-10-CM | POA: Diagnosis not present

## 2021-08-02 DIAGNOSIS — Z5111 Encounter for antineoplastic chemotherapy: Secondary | ICD-10-CM | POA: Diagnosis not present

## 2021-08-02 DIAGNOSIS — R69 Illness, unspecified: Secondary | ICD-10-CM | POA: Diagnosis not present

## 2021-08-02 DIAGNOSIS — D696 Thrombocytopenia, unspecified: Secondary | ICD-10-CM

## 2021-08-02 DIAGNOSIS — D649 Anemia, unspecified: Secondary | ICD-10-CM | POA: Diagnosis not present

## 2021-08-02 DIAGNOSIS — I10 Essential (primary) hypertension: Secondary | ICD-10-CM | POA: Diagnosis not present

## 2021-08-02 DIAGNOSIS — C9310 Chronic myelomonocytic leukemia not having achieved remission: Secondary | ICD-10-CM | POA: Diagnosis not present

## 2021-08-02 LAB — CBC WITH DIFFERENTIAL (CANCER CENTER ONLY)
Abs Immature Granulocytes: 0.11 K/uL — ABNORMAL HIGH (ref 0.00–0.07)
Basophils Absolute: 0.1 K/uL (ref 0.0–0.1)
Basophils Relative: 1 %
Eosinophils Absolute: 0.2 K/uL (ref 0.0–0.5)
Eosinophils Relative: 2 %
HCT: 30.2 % — ABNORMAL LOW (ref 36.0–46.0)
Hemoglobin: 9.4 g/dL — ABNORMAL LOW (ref 12.0–15.0)
Immature Granulocytes: 1 %
Lymphocytes Relative: 10 %
Lymphs Abs: 0.8 K/uL (ref 0.7–4.0)
MCH: 25.8 pg — ABNORMAL LOW (ref 26.0–34.0)
MCHC: 31.1 g/dL (ref 30.0–36.0)
MCV: 82.7 fL (ref 80.0–100.0)
Monocytes Absolute: 1.2 K/uL — ABNORMAL HIGH (ref 0.1–1.0)
Monocytes Relative: 16 %
Neutro Abs: 5.3 K/uL (ref 1.7–7.7)
Neutrophils Relative %: 70 %
Platelet Count: 53 K/uL — ABNORMAL LOW (ref 150–400)
RBC: 3.65 MIL/uL — ABNORMAL LOW (ref 3.87–5.11)
RDW: 19.9 % — ABNORMAL HIGH (ref 11.5–15.5)
WBC Count: 7.6 K/uL (ref 4.0–10.5)
nRBC: 0 % (ref 0.0–0.2)

## 2021-08-02 LAB — SAMPLE TO BLOOD BANK

## 2021-08-02 MED ORDER — ROMIPLOSTIM INJECTION 500 MCG
10.0000 ug/kg | Freq: Once | SUBCUTANEOUS | Status: AC
  Administered 2021-08-02: 630 ug via SUBCUTANEOUS
  Filled 2021-08-02: qty 1.01

## 2021-08-05 ENCOUNTER — Other Ambulatory Visit: Payer: Self-pay

## 2021-08-05 ENCOUNTER — Inpatient Hospital Stay: Payer: Medicare HMO

## 2021-08-05 DIAGNOSIS — E538 Deficiency of other specified B group vitamins: Secondary | ICD-10-CM | POA: Diagnosis not present

## 2021-08-05 DIAGNOSIS — D693 Immune thrombocytopenic purpura: Secondary | ICD-10-CM | POA: Diagnosis not present

## 2021-08-05 DIAGNOSIS — Z95828 Presence of other vascular implants and grafts: Secondary | ICD-10-CM

## 2021-08-05 DIAGNOSIS — D696 Thrombocytopenia, unspecified: Secondary | ICD-10-CM

## 2021-08-05 DIAGNOSIS — E1142 Type 2 diabetes mellitus with diabetic polyneuropathy: Secondary | ICD-10-CM | POA: Diagnosis not present

## 2021-08-05 DIAGNOSIS — C9310 Chronic myelomonocytic leukemia not having achieved remission: Secondary | ICD-10-CM

## 2021-08-05 DIAGNOSIS — R69 Illness, unspecified: Secondary | ICD-10-CM | POA: Diagnosis not present

## 2021-08-05 DIAGNOSIS — R519 Headache, unspecified: Secondary | ICD-10-CM | POA: Diagnosis not present

## 2021-08-05 DIAGNOSIS — D649 Anemia, unspecified: Secondary | ICD-10-CM | POA: Diagnosis not present

## 2021-08-05 DIAGNOSIS — Z79899 Other long term (current) drug therapy: Secondary | ICD-10-CM | POA: Diagnosis not present

## 2021-08-05 DIAGNOSIS — Z5111 Encounter for antineoplastic chemotherapy: Secondary | ICD-10-CM | POA: Diagnosis not present

## 2021-08-05 DIAGNOSIS — I10 Essential (primary) hypertension: Secondary | ICD-10-CM | POA: Diagnosis not present

## 2021-08-05 LAB — CBC WITH DIFFERENTIAL (CANCER CENTER ONLY)
Abs Immature Granulocytes: 0.06 10*3/uL (ref 0.00–0.07)
Basophils Absolute: 0 10*3/uL (ref 0.0–0.1)
Basophils Relative: 1 %
Eosinophils Absolute: 0.1 10*3/uL (ref 0.0–0.5)
Eosinophils Relative: 1 %
HCT: 30.8 % — ABNORMAL LOW (ref 36.0–46.0)
Hemoglobin: 9.6 g/dL — ABNORMAL LOW (ref 12.0–15.0)
Immature Granulocytes: 1 %
Lymphocytes Relative: 11 %
Lymphs Abs: 0.8 10*3/uL (ref 0.7–4.0)
MCH: 25.7 pg — ABNORMAL LOW (ref 26.0–34.0)
MCHC: 31.2 g/dL (ref 30.0–36.0)
MCV: 82.4 fL (ref 80.0–100.0)
Monocytes Absolute: 1.1 10*3/uL — ABNORMAL HIGH (ref 0.1–1.0)
Monocytes Relative: 16 %
Neutro Abs: 4.9 10*3/uL (ref 1.7–7.7)
Neutrophils Relative %: 70 %
Platelet Count: 64 10*3/uL — ABNORMAL LOW (ref 150–400)
RBC: 3.74 MIL/uL — ABNORMAL LOW (ref 3.87–5.11)
RDW: 20.5 % — ABNORMAL HIGH (ref 11.5–15.5)
WBC Count: 7 10*3/uL (ref 4.0–10.5)
nRBC: 0 % (ref 0.0–0.2)

## 2021-08-05 LAB — SAMPLE TO BLOOD BANK

## 2021-08-05 MED ORDER — HEPARIN SOD (PORK) LOCK FLUSH 100 UNIT/ML IV SOLN
500.0000 [IU] | Freq: Once | INTRAVENOUS | Status: AC | PRN
  Administered 2021-08-05: 500 [IU]

## 2021-08-05 MED ORDER — SODIUM CHLORIDE 0.9% FLUSH
10.0000 mL | Freq: Once | INTRAVENOUS | Status: AC | PRN
  Administered 2021-08-05: 10 mL

## 2021-08-05 MED ORDER — SODIUM CHLORIDE 0.9% FLUSH
10.0000 mL | INTRAVENOUS | Status: AC | PRN
  Administered 2021-08-05: 10 mL

## 2021-08-05 NOTE — Progress Notes (Signed)
Patient made aware that no blood product transfusion needed today based on lab results and listed parameters. Patient provided a copy of labs and Port access [place].

## 2021-08-09 ENCOUNTER — Inpatient Hospital Stay: Payer: Medicare HMO

## 2021-08-09 ENCOUNTER — Other Ambulatory Visit: Payer: Self-pay

## 2021-08-09 VITALS — BP 131/69 | HR 109 | Temp 98.2°F | Resp 18

## 2021-08-09 DIAGNOSIS — Z95828 Presence of other vascular implants and grafts: Secondary | ICD-10-CM

## 2021-08-09 DIAGNOSIS — R519 Headache, unspecified: Secondary | ICD-10-CM | POA: Diagnosis not present

## 2021-08-09 DIAGNOSIS — D696 Thrombocytopenia, unspecified: Secondary | ICD-10-CM

## 2021-08-09 DIAGNOSIS — C9310 Chronic myelomonocytic leukemia not having achieved remission: Secondary | ICD-10-CM

## 2021-08-09 DIAGNOSIS — E1142 Type 2 diabetes mellitus with diabetic polyneuropathy: Secondary | ICD-10-CM | POA: Diagnosis not present

## 2021-08-09 DIAGNOSIS — E538 Deficiency of other specified B group vitamins: Secondary | ICD-10-CM | POA: Diagnosis not present

## 2021-08-09 DIAGNOSIS — Z5111 Encounter for antineoplastic chemotherapy: Secondary | ICD-10-CM | POA: Diagnosis not present

## 2021-08-09 DIAGNOSIS — Z79899 Other long term (current) drug therapy: Secondary | ICD-10-CM | POA: Diagnosis not present

## 2021-08-09 DIAGNOSIS — R69 Illness, unspecified: Secondary | ICD-10-CM | POA: Diagnosis not present

## 2021-08-09 DIAGNOSIS — D649 Anemia, unspecified: Secondary | ICD-10-CM | POA: Diagnosis not present

## 2021-08-09 DIAGNOSIS — D693 Immune thrombocytopenic purpura: Secondary | ICD-10-CM | POA: Diagnosis not present

## 2021-08-09 DIAGNOSIS — I10 Essential (primary) hypertension: Secondary | ICD-10-CM | POA: Diagnosis not present

## 2021-08-09 LAB — CBC WITH DIFFERENTIAL (CANCER CENTER ONLY)
Abs Immature Granulocytes: 0.03 10*3/uL (ref 0.00–0.07)
Basophils Absolute: 0 10*3/uL (ref 0.0–0.1)
Basophils Relative: 0 %
Eosinophils Absolute: 0 10*3/uL (ref 0.0–0.5)
Eosinophils Relative: 1 %
HCT: 30.3 % — ABNORMAL LOW (ref 36.0–46.0)
Hemoglobin: 9.7 g/dL — ABNORMAL LOW (ref 12.0–15.0)
Immature Granulocytes: 0 %
Lymphocytes Relative: 11 %
Lymphs Abs: 0.7 10*3/uL (ref 0.7–4.0)
MCH: 26.2 pg (ref 26.0–34.0)
MCHC: 32 g/dL (ref 30.0–36.0)
MCV: 81.9 fL (ref 80.0–100.0)
Monocytes Absolute: 1 10*3/uL (ref 0.1–1.0)
Monocytes Relative: 15 %
Neutro Abs: 5 10*3/uL (ref 1.7–7.7)
Neutrophils Relative %: 73 %
Platelet Count: 74 10*3/uL — ABNORMAL LOW (ref 150–400)
RBC: 3.7 MIL/uL — ABNORMAL LOW (ref 3.87–5.11)
RDW: 20.5 % — ABNORMAL HIGH (ref 11.5–15.5)
WBC Count: 6.8 10*3/uL (ref 4.0–10.5)
nRBC: 0 % (ref 0.0–0.2)

## 2021-08-09 LAB — SAMPLE TO BLOOD BANK

## 2021-08-09 MED ORDER — SODIUM CHLORIDE 0.9% FLUSH
10.0000 mL | INTRAVENOUS | Status: DC | PRN
  Administered 2021-08-09: 10 mL via INTRAVENOUS

## 2021-08-09 MED ORDER — SODIUM CHLORIDE 0.9% FLUSH
3.0000 mL | Freq: Once | INTRAVENOUS | Status: AC | PRN
  Administered 2021-08-09: 3 mL

## 2021-08-09 MED ORDER — HEPARIN SOD (PORK) LOCK FLUSH 100 UNIT/ML IV SOLN
500.0000 [IU] | Freq: Once | INTRAVENOUS | Status: AC | PRN
  Administered 2021-08-09: 500 [IU]

## 2021-08-09 MED ORDER — ROMIPLOSTIM INJECTION 500 MCG
10.0000 ug/kg | Freq: Once | SUBCUTANEOUS | Status: AC
  Administered 2021-08-09: 630 ug via SUBCUTANEOUS
  Filled 2021-08-09: qty 0.26

## 2021-08-09 NOTE — Progress Notes (Signed)
Per Burr Medico MD, no blood or platelets today.

## 2021-08-12 DIAGNOSIS — M549 Dorsalgia, unspecified: Secondary | ICD-10-CM | POA: Diagnosis not present

## 2021-08-12 DIAGNOSIS — R69 Illness, unspecified: Secondary | ICD-10-CM | POA: Diagnosis not present

## 2021-08-12 DIAGNOSIS — C9310 Chronic myelomonocytic leukemia not having achieved remission: Secondary | ICD-10-CM | POA: Diagnosis not present

## 2021-08-12 DIAGNOSIS — Z981 Arthrodesis status: Secondary | ICD-10-CM | POA: Diagnosis not present

## 2021-08-12 DIAGNOSIS — Z9221 Personal history of antineoplastic chemotherapy: Secondary | ICD-10-CM | POA: Diagnosis not present

## 2021-08-13 DIAGNOSIS — R768 Other specified abnormal immunological findings in serum: Secondary | ICD-10-CM | POA: Diagnosis not present

## 2021-08-13 DIAGNOSIS — E1169 Type 2 diabetes mellitus with other specified complication: Secondary | ICD-10-CM | POA: Diagnosis not present

## 2021-08-13 DIAGNOSIS — C9310 Chronic myelomonocytic leukemia not having achieved remission: Secondary | ICD-10-CM | POA: Diagnosis not present

## 2021-08-13 MED FILL — Dexamethasone Sodium Phosphate Inj 100 MG/10ML: INTRAMUSCULAR | Qty: 1 | Status: AC

## 2021-08-16 ENCOUNTER — Inpatient Hospital Stay: Payer: Medicare HMO

## 2021-08-16 ENCOUNTER — Other Ambulatory Visit: Payer: Self-pay

## 2021-08-16 ENCOUNTER — Encounter: Payer: Self-pay | Admitting: Hematology

## 2021-08-16 ENCOUNTER — Inpatient Hospital Stay (HOSPITAL_BASED_OUTPATIENT_CLINIC_OR_DEPARTMENT_OTHER): Payer: Medicare HMO | Admitting: Hematology

## 2021-08-16 ENCOUNTER — Inpatient Hospital Stay: Payer: Medicare HMO | Attending: Hematology

## 2021-08-16 VITALS — BP 116/67 | HR 99 | Temp 98.5°F | Resp 18 | Wt 140.5 lb

## 2021-08-16 DIAGNOSIS — I10 Essential (primary) hypertension: Secondary | ICD-10-CM | POA: Diagnosis not present

## 2021-08-16 DIAGNOSIS — D693 Immune thrombocytopenic purpura: Secondary | ICD-10-CM | POA: Insufficient documentation

## 2021-08-16 DIAGNOSIS — R519 Headache, unspecified: Secondary | ICD-10-CM | POA: Diagnosis not present

## 2021-08-16 DIAGNOSIS — Z5111 Encounter for antineoplastic chemotherapy: Secondary | ICD-10-CM | POA: Insufficient documentation

## 2021-08-16 DIAGNOSIS — C9310 Chronic myelomonocytic leukemia not having achieved remission: Secondary | ICD-10-CM | POA: Diagnosis not present

## 2021-08-16 DIAGNOSIS — F410 Panic disorder [episodic paroxysmal anxiety] without agoraphobia: Secondary | ICD-10-CM | POA: Diagnosis not present

## 2021-08-16 DIAGNOSIS — Z79899 Other long term (current) drug therapy: Secondary | ICD-10-CM | POA: Diagnosis not present

## 2021-08-16 DIAGNOSIS — Z7984 Long term (current) use of oral hypoglycemic drugs: Secondary | ICD-10-CM | POA: Insufficient documentation

## 2021-08-16 DIAGNOSIS — D696 Thrombocytopenia, unspecified: Secondary | ICD-10-CM

## 2021-08-16 DIAGNOSIS — Z95828 Presence of other vascular implants and grafts: Secondary | ICD-10-CM

## 2021-08-16 DIAGNOSIS — R69 Illness, unspecified: Secondary | ICD-10-CM | POA: Diagnosis not present

## 2021-08-16 DIAGNOSIS — E1142 Type 2 diabetes mellitus with diabetic polyneuropathy: Secondary | ICD-10-CM | POA: Diagnosis not present

## 2021-08-16 DIAGNOSIS — D649 Anemia, unspecified: Secondary | ICD-10-CM | POA: Diagnosis not present

## 2021-08-16 DIAGNOSIS — E538 Deficiency of other specified B group vitamins: Secondary | ICD-10-CM | POA: Insufficient documentation

## 2021-08-16 LAB — CBC WITH DIFFERENTIAL (CANCER CENTER ONLY)
Abs Immature Granulocytes: 0.02 10*3/uL (ref 0.00–0.07)
Basophils Absolute: 0.1 10*3/uL (ref 0.0–0.1)
Basophils Relative: 2 %
Eosinophils Absolute: 0 10*3/uL (ref 0.0–0.5)
Eosinophils Relative: 1 %
HCT: 28.1 % — ABNORMAL LOW (ref 36.0–46.0)
Hemoglobin: 8.9 g/dL — ABNORMAL LOW (ref 12.0–15.0)
Immature Granulocytes: 1 %
Lymphocytes Relative: 14 %
Lymphs Abs: 0.5 10*3/uL — ABNORMAL LOW (ref 0.7–4.0)
MCH: 26.2 pg (ref 26.0–34.0)
MCHC: 31.7 g/dL (ref 30.0–36.0)
MCV: 82.6 fL (ref 80.0–100.0)
Monocytes Absolute: 0.6 10*3/uL (ref 0.1–1.0)
Monocytes Relative: 16 %
Neutro Abs: 2.4 10*3/uL (ref 1.7–7.7)
Neutrophils Relative %: 66 %
Platelet Count: 96 10*3/uL — ABNORMAL LOW (ref 150–400)
RBC: 3.4 MIL/uL — ABNORMAL LOW (ref 3.87–5.11)
RDW: 20.7 % — ABNORMAL HIGH (ref 11.5–15.5)
WBC Count: 3.6 10*3/uL — ABNORMAL LOW (ref 4.0–10.5)
nRBC: 0 % (ref 0.0–0.2)

## 2021-08-16 LAB — SAMPLE TO BLOOD BANK

## 2021-08-16 MED ORDER — PALONOSETRON HCL INJECTION 0.25 MG/5ML
0.2500 mg | Freq: Once | INTRAVENOUS | Status: AC
  Administered 2021-08-16: 0.25 mg via INTRAVENOUS
  Filled 2021-08-16: qty 5

## 2021-08-16 MED ORDER — SODIUM CHLORIDE 0.9 % IV SOLN: Freq: Once | INTRAVENOUS | Status: AC

## 2021-08-16 MED ORDER — SODIUM CHLORIDE 0.9 % IV SOLN
10.0000 mg | Freq: Once | INTRAVENOUS | Status: AC
  Administered 2021-08-16: 10 mg via INTRAVENOUS
  Filled 2021-08-16: qty 10

## 2021-08-16 MED ORDER — SODIUM CHLORIDE 0.9% FLUSH
10.0000 mL | INTRAVENOUS | Status: DC | PRN
  Administered 2021-08-16: 10 mL

## 2021-08-16 MED ORDER — SODIUM CHLORIDE 0.9 % IV SOLN
75.0000 mg/m2 | Freq: Once | INTRAVENOUS | Status: AC
  Administered 2021-08-16: 135 mg via INTRAVENOUS
  Filled 2021-08-16: qty 13.5

## 2021-08-16 MED ORDER — HEPARIN SOD (PORK) LOCK FLUSH 100 UNIT/ML IV SOLN
500.0000 [IU] | Freq: Once | INTRAVENOUS | Status: AC | PRN
  Administered 2021-08-16: 500 [IU]

## 2021-08-16 MED ORDER — SODIUM CHLORIDE 0.9% FLUSH
10.0000 mL | INTRAVENOUS | Status: AC | PRN
  Administered 2021-08-16: 10 mL

## 2021-08-16 MED ORDER — ROMIPLOSTIM INJECTION 500 MCG
10.0000 ug/kg | Freq: Once | SUBCUTANEOUS | Status: AC
  Administered 2021-08-16: 635 ug via SUBCUTANEOUS
  Filled 2021-08-16: qty 1

## 2021-08-16 MED FILL — Dexamethasone Sodium Phosphate Inj 100 MG/10ML: INTRAMUSCULAR | Qty: 1 | Status: AC

## 2021-08-16 NOTE — Patient Instructions (Signed)
[facility]   ?Discharge Instructions: ?Thank you for choosing [facility] to provide your oncology and hematology care.  ? ?If you have a lab appointment with the [facility], please go directly to the [facility] and check in at the registration area. ?  ?Wear comfortable clothing and clothing appropriate for easy access to any Portacath or PICC line.  ? ?We strive to give you quality time with your provider. You may need to reschedule your appointment if you arrive late (15 or more minutes).  Arriving late affects you and other patients whose appointments are after yours.  Also, if you miss three or more appointments without notifying the office, you may be dismissed from the clinic at the provider?s discretion.    ?  ?For prescription refill requests, have your pharmacy contact our office and allow 72 hours for refills to be completed.   ? ?Today you received the following chemotherapy and/or immunotherapy agents: azacitadine and Nplate ?  ?To help prevent nausea and vomiting after your treatment, we encourage you to take your nausea medication as directed. ? ?BELOW ARE SYMPTOMS THAT SHOULD BE REPORTED IMMEDIATELY: ?*FEVER GREATER THAN 100.4 F (38 ?C) OR HIGHER ?*CHILLS OR SWEATING ?*NAUSEA AND VOMITING THAT IS NOT CONTROLLED WITH YOUR NAUSEA MEDICATION ?*UNUSUAL SHORTNESS OF BREATH ?*UNUSUAL BRUISING OR BLEEDING ?*URINARY PROBLEMS (pain or burning when urinating, or frequent urination) ?*BOWEL PROBLEMS (unusual diarrhea, constipation, pain near the anus) ?TENDERNESS IN MOUTH AND THROAT WITH OR WITHOUT PRESENCE OF ULCERS (sore throat, sores in mouth, or a toothache) ?UNUSUAL RASH, SWELLING OR PAIN  ?UNUSUAL VAGINAL DISCHARGE OR ITCHING  ? ?Items with * indicate a potential emergency and should be followed up as soon as possible or go to the Emergency Department if any problems should occur. ? ?Please show the CHEMOTHERAPY ALERT CARD or IMMUNOTHERAPY ALERT CARD at  check-in to the Emergency Department and triage nurse. ? ?Should you have questions after your visit or need to cancel or reschedule your appointment, please contact [facility]  Dept: [phone]  and follow the prompts.  Office hours are 8:00 a.m. to 4:30 p.m. Monday - Friday. Please note that voicemails left after 4:00 p.m. may not be returned until the following business day.  We are closed weekends and major holidays. You have access to a nurse at all times for urgent questions. Please call the main number to the clinic Dept: [phone] and follow the prompts. ? ? ?For any non-urgent questions, you may also contact your provider using MyChart. We now offer e-Visits for anyone 39 and older to request care online for non-urgent symptoms. For details visit [email or web address]. ?  ?Also download the MyChart app! Go to the app store, search "MyChart", open the app, select [facility], and log in with your MyChart username and password. ? ?Due to Covid, a mask is required upon entering the hospital/clinic. If you do not have a mask, one will be given to you upon arrival. For doctor visits, patients may have 1 support person aged 37 or older with them. For treatment visits, patients cannot have anyone with them due to current Covid guidelines and our immunocompromised population.  ? ?

## 2021-08-16 NOTE — Progress Notes (Addendum)
[place]   [phone] [phone]   Clinic Follow up Note   Patient Care Team: [name], DO as PCP - General (Family Medicine) Loletha Carrow, Kirke Corin, MD as Consulting Physician (Gastroenterology) Truitt Merle, MD as Consulting Physician (Hematology) Alda Berthold, DO as Consulting Physician (Neurology)  Date of Service:  08/16/2021  CHIEF COMPLAINT: f/u of [name], severe thrombocytopenia  CURRENT THERAPY:  Azacitidine days 1-5 q28 days, started 12/28/20 Nplate 3mcg/kg weekly, dose reduced since 07/19/21 when plt normalized  Platelet transfusion as needed (plt<15K), blood transfusion if Hg<8.0  ASSESSMENT & PLAN:  Kathleen Gibson is a 71 y.o. female with   1. [name] with severe thrombocytopenia  -Initially diagnosed with ITP in 2014. She lost follow up after 11/2017 and did not proceed with recommended bone marrow biopsy. -She was referred to ED 10/28/20 by her PCP for low plt count of 4k. She was hospitalized and treated with platelet transfusions, IVIG, oral prednisone $RemoveBeforeD'60mg'zEvwWItAEFKvjS$  and Nplate injections. She tapered off prednisone given little response. -Bone marrow biopsy 10/30/20 felt to likely represent MDS/MPN, particularly [name].  Confirmed on slide review at [facility] by Dr. Linus Orn -She began treatment for severe refractory thrombocytopenia with weekly Rituxan x4 on 11/19/20, she did not respond  -She began azacitidine daily days 1-5 q. 28 days on 12/28/20 -She had worsening thrombocytopenia after stopping Nplate, restarted weekly Nplate 10 mcg/kg on 0/62/37 -Bone marrow biopsy 03/18/21 showed stable disease, no increase in blasts. -her platelet count has fluctuated and was high enough for kyphoplasty procedure on 05/31/21.  -she last saw Dr. Linus Orn on 08/12/21. They will hold on BMB given her good response to chemo.  -labs reviewed, plt 96k today. She will proceed with Vidaza today and daily for the rest of this week. -Continue weekly CBC and Nplate  injection -Follow-up in 4 weeks before her next cycle of chemo. -She may want to travel to [place] state after next cycle of chemo   2.  Thoracic spine compression fractures -T10 and T12 kyphoplasty performed 05/31/21 -she reports improvement in pain and no longer uses thoracic brace. -will hold on Zometa for now until she sees dentist  -She still feels some tightness on her back when she does garden work, I encouraged her to do outpt physical therapy, she will think about it.   3. Normocytic anemia, moderate  -H/o pernicious anemia/vitamin B12 deficiency -Transfuse for Hgb </= 7.5 -Continue oral iron    4. Comorbidities: DM type 2 with polyneuropathy, HTN, h/o panic attack, headaches -Continue medication, follow-up PCP -Etiology of headaches unknown, managed with Tylenol -panic attack occurred once, managed with family support and lorazepam.     PLAN: -proceed with azacitidine today -continue azacitidine daily for the rest of this week as scheduled  - lab and Nplate injection weekly -next cycle Vidaza infusion daily X5, starting in 4 weeks -F/u in 4 weeks -Transfusion as needed   No problem-specific Assessment & Plan notes found for this encounter.   SUMMARY OF ONCOLOGIC HISTORY: Oncology History  [name] (chronic myelomonocytic leukemia) ([place])  10/29/2020 Imaging   CT CAP  IMPRESSION: 1. Splenomegaly with pathologically enlarged lymph nodes above and below the diaphragm, with overall stable to minimally increased abdominal adenopathy and interval progression of the pelvic adenopathy. 2. Small volume abdominopelvic ascites with diffuse mesenteric stranding. 3. Scattered bilateral pulmonary micro nodules measuring 1-2 mm. 4. Distended gallbladder with some layering hyperdense material representing layering sludge and tiny stones seen on prior  ultrasound. 5. Aortic atherosclerosis.   10/30/2020 Pathology Results   DIAGNOSIS:   BONE MARROW, ASPIRATE, CLOT, CORE:  -   Hypercellular bone marrow with panhyperplasia, atypia, and no  increase in blasts  -  See comment   PERIPHERAL BLOOD:  -  Marked thrombocytopenia  -  Absolute monocytosis  -  Normocytic anemia  -  See CBC data and comment   COMMENT:  The bone marrow is hypercellular for age (approximately 80%) with myeloid hyperplasia with maturational left shift, erythroid hyperplasia, and increased megakaryocytes.  Mild multilineage atypia is present. Blasts are not increased on aspirate smears (1% by manual differential count) or by CD34 immunohistochemistry on the core biopsy.  Concurrent flow cytometric analysis of the bone marrow aspirate demonstrates increased monocytes, and no increase in blasts or abnormal lymphoid population (see [id number]).  Monocytes are also increased in peripheral  blood, persistent per electronic medical record.  In aggregate, the  findings raise the possibility of a myeloid neoplasm with the  differential diagnosis including a low-grade myelodysplastic syndrome and chronic myelomonocytic leukemia (dysplastic type).    ADDENDUM:  A reticulin special stain performed on the bone marrow core biopsy reveals no significant increase in reticulin fibrosis.  ADDENDUM:  CYTOGENETIC RESULTS:  Karyotype: 46,XX[20]  Interpretation: NORMAL FEMALE KARYOTYPE   FISH RESULTS:  Results: NORMAL   ADDENDUM:  CD123 immunohistochemistry performed on the core biopsy highlights scattered aggregates of positively staining cells consistent with plasmacytoid dendritic cells.    10/30/2020 Pathology Results   DIAGNOSIS:   BONE MARROW; FLOW CYTOMETRIC ANALYSIS:  -  Increased monocytes  -  Scant B-cells present  -  No immunophenotypically aberrant T-cell population identified  -  No increase in blasts  -  See comment   COMMENT:  Monocytes are relatively increased (12% of all cells), without aberrant expression of CD56.  B-cells comprise <1% of total lymphocytes. CD34-positive blasts are not  increased (<1% of all cells).  Correlation with concurrent morphology is recommended for complete diagnostic interpretation and overall blast enumeration (see [id number]).    10/30/2020 Pathology Results   FINAL MICROSCOPIC DIAGNOSIS:   A. LYMPH NODE, RIGHT AXILLARY, NEEDLE CORE BIOPSY:  -Lymphoid tissue present  -See comment   COMMENT:  The sections show several small needle core biopsy fragments of lymphoid tissue displaying degenerative cellular changes/necrosis and hence cannot be accurately evaluated.  Sample for flow cytometric analysis not available.  Immunohistochemical stain for CD3 and CD20 were performed with appropriate controls.  There is a mixture of T and B cells in their apparently respective compartments.  There is no definite metastatic malignancy.    11/11/2020 Pathology Results   DIAGNOSIS:   LEFT AXILLARY LYMPH NODE EXCISIONAL BIOPSY; FLOW CYTOMETRIC ANALYSIS:  -  No monotypic B-cell or immunophenotypically aberrant T-cell  population identified  -  See comment   COMMENT:  Flow cytometric analysis identifies B-cells with a normal kappa:lambda ratio of 1.7:1.  A subset of the polytypic B-cells expresses CD10. T-cells show a CD4:CD8 ratio of 3.3:1 without immunophenotypic aberrancy with the markers evaluated.  Although these results do not support the diagnosis of a clonal lymphoid population, sampling issues must always be considered when negative results are obtained, as focal lesions may not be represented in the specimen submitted.  In addition, flow cytometric immunophenotyping will not exclude other pathology if present (e.g. Hodgkin lymphoma, some T-cell lymphomas, metastatic and infectious diseases).   11/11/2020 Pathology Results   FINAL MICROSCOPIC DIAGNOSIS:   A. LYMPH NODE, LEFT AXILLARY, DISSECTION:  -  Follicular hyperplasia with interfollicular expansion  -  See comment   COMMENT:  Sections of the lymph nodes reveal generally preserved lymph node  architecture.  There is follicular hyperplasia with some follicles showing increased hyalinization of germinal centers and mild concentric encircling of germinal centers with small lymphocytes (onion skinning). Interfollicular areas are expanded with increased vascular proliferation, small lymphocytes, plasma cells, histiocytic cells, and patchy neutrophils.  The lymph node capsule is variably thickened by fibrosis with few plasma cells.   A panel of immunohistochemical stains is performed for further  characterization.  CD3 and CD20/PAX5 highlight T-cell and B-cell  compartments, respectively.  Germinal centers are highlighted by CD10 and BCL6 with appropriate absent expression of BCL2.  CD5 is similar to CD3.  CD43 also stains the T-cells.  CD4-positive T-cells exceed CD8-positive T-cells.  CD30 highlights scattered immunoblasts.  CD21 reveals intact follicular dendritic cell meshworks.  CD68 highlights increased histiocytic cells.  HHV 8 is negative.  T. pallidum reveals no definitive organisms.  Pancytokeratin is negative.  TdT stains rare cells.  CD138 highlights plasma cells which are not increased in number and show polytypic light chain expression by kappa/lambda in situ  hybridization.  EBV is negative by in situ hybridization.   Concurrent flow cytometric analysis is negative for a monoclonal B-cell or immunophenotypically aberrant T-cell population (see [phone]).   Together, the findings above are non-specific and can be seen in  reactive and infectious processes as well as Castleman disease.  There is no definitive morphologic or flow cytometric evidence of involvement by a lymphoproliferative disorder from the current workup.   12/17/2020 Initial Diagnosis   [name] (chronic myelomonocytic leukemia) ([place])   12/28/2020 -  Chemotherapy   Patient is on Treatment Plan : MYELODYSPLASIA  Azacitidine IV D1-5 q28d     03/18/2021 Pathology Results   Final Diagnosis    BONE MARROW:              Persistent chronic myelomonocytic leukemia in a hypercellular bone marrow with increased monocytes and no increase in blasts.   Comment    Flow cytometric analysis showed no increase in blasts and 17% monocytes.A next generation myeloid panel showed the presence of the following mutations: NRAS, SH2B3, PHF6 and TET2. CD34 and CD117 immunstains do not show an increase in blasts. The findings are consistent with persistent [name] with a decrease in the number of blasts compared to that seen on the previous bone marrow.    Final Interpretation      BONE MARROW; FLOW CYTOMETRIC ANALYSIS:   No increased blasts identified. (see comment)  Monocytosis (17% of total events).    COMMENT: Flow cytometry identified about 0.6% of total events as immature cells. These cells express CD45 (dim), CD34, CD117, HLA-DR, CD38, CD33 (variable). This immunophenotype is consistent with normal myeloblasts. In addition, monocytes are increased and comprise of approximately 17% of total events. These monocytes show normal expression of CD33/CD64/CD14/CD11b with variable expression of CD4. Correlation with concurrent morphology and clinical data is recommended (see [id number]).    FLOW CYTOMETRY ANALYSIS: CD45 versus side scatter analysis demonstrate a predominance of granulocytes (~70%), monocytes (~17%) and lymphocytes (~6%). No significant increase in blasts identified. All tested markers were used for adequate analysis of the cells and were appropriately reviewed.        INTERVAL HISTORY:  SUZANN LAZARO is here for a follow up of [name]. She was last seen by me on 07/19/21. She was seen in the infusion area. She notes she is  interested in going to [place] to be with her son and daughter-in-law for about a month. She notes she is having back pain, which she feels is more muscular in nature.   All other systems were reviewed with the patient and are negative.  MEDICAL HISTORY:  Past Medical History:  Diagnosis Date    Diabetes ([place])    HLD (hyperlipidemia)    Hypertension    Pernicious anemia    Pneumonia 09/05/2019   Renal insufficiency 09/06/2019   Vitamin D deficiency     SURGICAL HISTORY: Past Surgical History:  Procedure Laterality Date   ABDOMINAL HERNIA REPAIR  2009   ABDOMINOPLASTY     AXILLARY LYMPH NODE BIOPSY Left 11/11/2020   Procedure: LEFT AXILLARY EXCISIONAL LYMPH NODE BIOPSY;  Surgeon: [name], MD;  Location: [facility];  Service: General;  Laterality: Left;   [place]   GASTRIC BYPASS  2000   hemorroid surgery  2007   IR IMAGING GUIDED PORT INSERTION  07/26/2021   IR KYPHO EA ADDL LEVEL THORACIC OR LUMBAR  05/31/2021   IR KYPHO THORACIC WITH BONE BIOPSY  05/31/2021   TONSILLECTOMY AND ADENOIDECTOMY  1957    I have reviewed the social history and family history with the patient and they are unchanged from previous note.  ALLERGIES:  is allergic to asa [aspirin] and nsaids.  MEDICATIONS:  Current Outpatient Medications  Medication Sig Dispense Refill   acetaminophen (TYLENOL) 500 MG tablet Take 2 tablets (1,000 mg total) by mouth every 8 (eight) hours. 30 tablet 0   ammonium lactate (LAC-HYDRIN) 12 % lotion Apply 1 application topically 2 (two) times daily as needed for dry skin.     bisacodyl (DULCOLAX) 10 MG suppository Place 1 suppository (10 mg total) rectally daily as needed for moderate constipation (Please take-if no bowel movement in 1-2 days with MiraLAX/senna). 12 suppository 0   cyanocobalamin (,VITAMIN B-12,) 1000 MCG/ML injection Inject 1,000 mcg into the muscle every 30 (thirty) days.     gabapentin (NEURONTIN) 300 MG capsule Take 2 capsules (600 mg total) by mouth 2 (two) times daily. 360 capsule 1   glucose blood (ONETOUCH VERIO) test strip Use as instructed 100 each 12   Lancets (ONETOUCH ULTRASOFT) lancets Use as instructed 100 each 12   lidocaine-prilocaine (EMLA) cream Apply 1 application topically as needed. Apply 2 gm to Port-a-Cath site 30  minutes to 1 hour prior to Port-a-Cath access. 30 g 1   LORazepam (ATIVAN) 1 MG tablet TAKE 1 TABLET BY MOUTH EVERY 8 HOURS AS NEEDED FOR ANXIETY 30 tablet 0   metoprolol succinate (TOPROL-XL) 25 MG 24 hr tablet Take 1 tablet (25 mg total) by mouth daily. 90 tablet 1   naloxone (NARCAN) nasal spray 4 mg/0.1 mL Place 1 spray into the nose once as needed (overdose). 2 each 0   ondansetron (ZOFRAN-ODT) 4 MG disintegrating tablet Take 4 mg by mouth every 8 (eight) hours as needed for nausea or vomiting.     Oxycodone HCl 10 MG TABS 1 tab every 4-6 hr prn for chronic pain (Patient taking differently: Take 10 mg by mouth every 4 (four) hours as needed (chronic pain). 1 tab every 4-6 hr prn for chronic pain) 120 tablet 0   PARoxetine (PAXIL) 10 MG tablet Take 10 mg by mouth every morning.     polyethylene glycol powder (GLYCOLAX/MIRALAX) 17 GM/SCOOP powder Dissolve 1 capful (17 g) in water and drink 2 (two) times daily. (Patient taking differently: Take 17 g by  mouth daily.) 510 g 2   senna-docusate (SENOKOT-S) 8.6-50 MG tablet Take 2 tablets by mouth at bedtime. (Patient taking differently: Take 2 tablets by mouth at bedtime as needed for mild constipation.) 60 tablet 2   sitaGLIPtin-metformin (JANUMET) 50-1000 MG tablet Take 1 tablet by mouth at bedtime. 90 tablet 1   No current facility-administered medications for this visit.   Facility-Administered Medications Ordered in Other Visits  Medication Dose Route Frequency Provider Last Rate Last Admin   0.9 %  sodium chloride infusion (Manually program via Guardrails IV Fluids)  250 mL Intravenous Once Truitt Merle, MD        PHYSICAL EXAMINATION: ECOG PERFORMANCE STATUS: 2 - Symptomatic, <50% confined to bed  There were no vitals filed for this visit. Wt Readings from Last 3 Encounters:  08/16/21 140 lb 8 oz (63.7 kg)  07/26/21 138 lb 8 oz (62.8 kg)  07/19/21 144 lb 14.4 oz (65.7 kg)     GENERAL:alert, no distress and comfortable SKIN: skin color  normal, no rashes or significant lesions EYES: normal, Conjunctiva are pink and non-injected, sclera clear  NEURO: alert & oriented x 3 with fluent speech  LABORATORY DATA:  I have reviewed the data as listed CBC Latest Ref Rng & Units 08/16/2021 08/09/2021 08/05/2021  WBC 4.0 - 10.5 K/uL 3.6(L) 6.8 7.0  Hemoglobin 12.0 - 15.0 g/dL 8.9(L) 9.7(L) 9.6(L)  Hematocrit 36.0 - 46.0 % 28.1(L) 30.3(L) 30.8(L)  Platelets 150 - 400 K/uL 96(L) 74(L) 64(L)     CMP Latest Ref Rng & Units 07/22/2021 07/19/2021 07/12/2021  Glucose 70 - 99 mg/dL 144(H) 222(H) 210(H)  BUN 8 - 23 mg/dL $Remove'14 15 16  'NiPKhqr$ Creatinine 0.44 - 1.00 mg/dL 0.64 0.63 0.66  Sodium 135 - 145 mmol/L 138 139 139  Potassium 3.5 - 5.1 mmol/L 3.8 4.2 4.3  Chloride 98 - 111 mmol/L 105 107 106  CO2 22 - 32 mmol/L $RemoveB'30 27 28  'ojvMrAmT$ Calcium 8.9 - 10.3 mg/dL 8.8(L) 8.5(L) 8.7(L)  Total Protein 6.5 - 8.1 g/dL 6.6 6.7 6.8  Total Bilirubin 0.3 - 1.2 mg/dL 0.7 0.8 0.7  Alkaline Phos 38 - 126 U/L 111 107 99  AST 15 - 41 U/L 13(L) 11(L) 14(L)  ALT 0 - 44 U/L $Remo'18 12 15      'aWkFy$ RADIOGRAPHIC STUDIES: I have personally reviewed the radiological images as listed and agreed with the findings in the report. No results found.    No orders of the defined types were placed in this encounter.  All questions were answered. The patient knows to call the clinic with any problems, questions or concerns. No barriers to learning was detected. The total time spent in the appointment was 30 minutes.     Truitt Merle, MD 08/16/2021   I, Wilburn Mylar, am acting as scribe for Truitt Merle, MD.   I have reviewed the above documentation for accuracy and completeness, and I agree with the above.

## 2021-08-16 NOTE — Progress Notes (Signed)
Ok to treat with last weeks cmp results per Dr. Burr Medico. ?

## 2021-08-17 ENCOUNTER — Inpatient Hospital Stay: Payer: Medicare HMO

## 2021-08-17 ENCOUNTER — Telehealth: Payer: Self-pay | Admitting: Hematology

## 2021-08-17 VITALS — BP 114/55 | HR 93 | Temp 97.9°F | Resp 20 | Wt 141.2 lb

## 2021-08-17 DIAGNOSIS — C9310 Chronic myelomonocytic leukemia not having achieved remission: Secondary | ICD-10-CM | POA: Diagnosis not present

## 2021-08-17 DIAGNOSIS — D693 Immune thrombocytopenic purpura: Secondary | ICD-10-CM | POA: Diagnosis not present

## 2021-08-17 DIAGNOSIS — E1142 Type 2 diabetes mellitus with diabetic polyneuropathy: Secondary | ICD-10-CM | POA: Diagnosis not present

## 2021-08-17 DIAGNOSIS — I10 Essential (primary) hypertension: Secondary | ICD-10-CM | POA: Diagnosis not present

## 2021-08-17 DIAGNOSIS — Z7984 Long term (current) use of oral hypoglycemic drugs: Secondary | ICD-10-CM | POA: Diagnosis not present

## 2021-08-17 DIAGNOSIS — Z5111 Encounter for antineoplastic chemotherapy: Secondary | ICD-10-CM | POA: Diagnosis not present

## 2021-08-17 DIAGNOSIS — R69 Illness, unspecified: Secondary | ICD-10-CM | POA: Diagnosis not present

## 2021-08-17 DIAGNOSIS — R519 Headache, unspecified: Secondary | ICD-10-CM | POA: Diagnosis not present

## 2021-08-17 DIAGNOSIS — E538 Deficiency of other specified B group vitamins: Secondary | ICD-10-CM | POA: Diagnosis not present

## 2021-08-17 DIAGNOSIS — D649 Anemia, unspecified: Secondary | ICD-10-CM | POA: Diagnosis not present

## 2021-08-17 MED ORDER — SODIUM CHLORIDE 0.9 % IV SOLN
75.0000 mg/m2 | Freq: Once | INTRAVENOUS | Status: AC
  Administered 2021-08-17: 135 mg via INTRAVENOUS
  Filled 2021-08-17: qty 13.5

## 2021-08-17 MED ORDER — SODIUM CHLORIDE 0.9% FLUSH
10.0000 mL | INTRAVENOUS | Status: DC | PRN
  Administered 2021-08-17: 10 mL

## 2021-08-17 MED ORDER — SODIUM CHLORIDE 0.9 % IV SOLN
75.0000 mg/m2 | Freq: Once | INTRAVENOUS | Status: DC
  Filled 2021-08-17 (×3): qty 13.5

## 2021-08-17 MED ORDER — HEPARIN SOD (PORK) LOCK FLUSH 100 UNIT/ML IV SOLN
500.0000 [IU] | Freq: Once | INTRAVENOUS | Status: AC | PRN
  Administered 2021-08-17: 500 [IU]

## 2021-08-17 MED ORDER — SODIUM CHLORIDE 0.9 % IV SOLN
10.0000 mg | Freq: Once | INTRAVENOUS | Status: AC
  Administered 2021-08-17: 10 mg via INTRAVENOUS
  Filled 2021-08-17: qty 10

## 2021-08-17 MED ORDER — SODIUM CHLORIDE 0.9 % IV SOLN: Freq: Once | INTRAVENOUS | Status: AC

## 2021-08-17 MED FILL — Dexamethasone Sodium Phosphate Inj 100 MG/10ML: INTRAMUSCULAR | Qty: 1 | Status: AC

## 2021-08-17 NOTE — Telephone Encounter (Signed)
Unable to leave a message with follow-up appointments per 3/6 los. Patient is Mychart active. ?

## 2021-08-17 NOTE — Patient Instructions (Addendum)
[facility]ICAL [facility]COLOG[facility]  Discharge Instructions: Thank you for choosing [facility] to provide your oncology and hematology care.   If you have a lab appointment with the Cancer Center, please go directly to the Cancer Center and check in at the registration area.   Wear comfortable clothing and clothing appropriate for easy access to any Portacath or PICC line.   We strive to give you quality time with your provider. You may need to reschedule your appointment if you arrive late (15 or more minutes).  Arriving late affects you and other patients whose appointments are after yours.  Also, if you miss three or more appointments without notifying the office, you may be dismissed from the clinic at the provider's discretion.      For prescription refill requests, have your pharmacy contact our office and allow 72 hours for refills to be completed.    Today you received the following chemotherapy and/or immunotherapy agent: Azacitidine (Vidaza)      To help prevent nausea and vomiting after your treatment, we encourage you to take your nausea medication as directed.  BELOW ARE SYMPTOMS THAT SHOULD BE REPORTED IMMEDIATELY: . *FEVER GREATER THAN 100.4 F (38 C) OR HIGHER . *CHILLS OR SWEATING . *NAUSEA AND VOMITING THAT IS NOT CONTROLLED WITH YOUR NAUSEA MEDICATION . *UNUSUAL SHORTNESS OF BREATH . *UNUSUAL BRUISING OR BLEEDING . *URINARY PROBLEMS (pain or burning when urinating, or frequent urination) . *BOWEL PROBLEMS (unusual diarrhea, constipation, pain near the anus) . TENDERNESS IN MOUTH AND THROAT WITH OR WITHOUT PRESENCE OF ULCERS (sore throat, sores in mouth, or a toothache) . UNUSUAL RASH, SWELLING OR PAIN  . UNUSUAL VAGINAL DISCHARGE OR ITCHING   Items with * indicate a potential emergency and should be followed up as soon as possible or go to the Emergency Department if any problems should occur.  Please show the CHEMOTHERAPY ALERT CARD or  IMMUNOTHERAPY ALERT CARD at check-in to the Emergency Department and triage nurse.  Should you have questions after your visit or need to cancel or reschedule your appointment, please contact [facility]  Dept: [phone]  and follow the prompts.  Office hours are 8:00 a.m. to 4:30 p.m. Monday - Friday. Please note that voicemails left after 4:00 p.m. may not be returned until the following business day.  We are closed weekends and major holidays. You have access to a nurse at all times for urgent questions. Please call the main number to the clinic Dept: [phone] and follow the prompts.   For any non-urgent questions, you may also contact your provider using MyChart. We now offer e-Visits for anyone 18 and older to request care online for non-urgent symptoms. For details visit [email or web address].   Also download the MyChart app! Go to the app store, search "MyChart", open the app, select [facility], and log in with your MyChart username and password.  Due to Covid, a mask is required upon entering the hospital/clinic. If you do not have a mask, one will be given to you upon arrival. For doctor visits, patients may have 1 support person aged 18 or older with them. For treatment visits, patients cannot have anyone with them due to current Covid guidelines and our immunocompromised population.   

## 2021-08-18 ENCOUNTER — Other Ambulatory Visit: Payer: Self-pay

## 2021-08-18 ENCOUNTER — Inpatient Hospital Stay: Payer: Medicare HMO

## 2021-08-18 VITALS — BP 102/86 | HR 99 | Temp 98.1°F | Resp 18

## 2021-08-18 DIAGNOSIS — E538 Deficiency of other specified B group vitamins: Secondary | ICD-10-CM | POA: Diagnosis not present

## 2021-08-18 DIAGNOSIS — D649 Anemia, unspecified: Secondary | ICD-10-CM | POA: Diagnosis not present

## 2021-08-18 DIAGNOSIS — E1142 Type 2 diabetes mellitus with diabetic polyneuropathy: Secondary | ICD-10-CM | POA: Diagnosis not present

## 2021-08-18 DIAGNOSIS — Z7984 Long term (current) use of oral hypoglycemic drugs: Secondary | ICD-10-CM | POA: Diagnosis not present

## 2021-08-18 DIAGNOSIS — C9310 Chronic myelomonocytic leukemia not having achieved remission: Secondary | ICD-10-CM

## 2021-08-18 DIAGNOSIS — R69 Illness, unspecified: Secondary | ICD-10-CM | POA: Diagnosis not present

## 2021-08-18 DIAGNOSIS — D693 Immune thrombocytopenic purpura: Secondary | ICD-10-CM | POA: Diagnosis not present

## 2021-08-18 DIAGNOSIS — R519 Headache, unspecified: Secondary | ICD-10-CM | POA: Diagnosis not present

## 2021-08-18 DIAGNOSIS — Z5111 Encounter for antineoplastic chemotherapy: Secondary | ICD-10-CM | POA: Diagnosis not present

## 2021-08-18 DIAGNOSIS — I10 Essential (primary) hypertension: Secondary | ICD-10-CM | POA: Diagnosis not present

## 2021-08-18 MED ORDER — SODIUM CHLORIDE 0.9 % IV SOLN
10.0000 mg | Freq: Once | INTRAVENOUS | Status: AC
  Administered 2021-08-18: 10 mg via INTRAVENOUS
  Filled 2021-08-18: qty 10

## 2021-08-18 MED ORDER — SODIUM CHLORIDE 0.9 % IV SOLN: Freq: Once | INTRAVENOUS | Status: AC

## 2021-08-18 MED ORDER — SODIUM CHLORIDE 0.9 % IV SOLN
75.0000 mg/m2 | Freq: Once | INTRAVENOUS | Status: AC
  Administered 2021-08-18: 135 mg via INTRAVENOUS
  Filled 2021-08-18: qty 13.5

## 2021-08-18 MED ORDER — SODIUM CHLORIDE 0.9% FLUSH
10.0000 mL | INTRAVENOUS | Status: DC | PRN
  Administered 2021-08-18: 10 mL

## 2021-08-18 MED ORDER — GABAPENTIN 300 MG PO CAPS: 600.0000 mg | ORAL_CAPSULE | Freq: Two times a day (BID) | ORAL | 0 refills | Status: DC

## 2021-08-18 MED ORDER — PALONOSETRON HCL INJECTION 0.25 MG/5ML
0.2500 mg | Freq: Once | INTRAVENOUS | Status: AC
  Administered 2021-08-18: 0.25 mg via INTRAVENOUS
  Filled 2021-08-18: qty 5

## 2021-08-18 MED ORDER — HEPARIN SOD (PORK) LOCK FLUSH 100 UNIT/ML IV SOLN
500.0000 [IU] | Freq: Once | INTRAVENOUS | Status: AC | PRN
  Administered 2021-08-18: 500 [IU]

## 2021-08-18 MED FILL — Dexamethasone Sodium Phosphate Inj 100 MG/10ML: INTRAMUSCULAR | Qty: 1 | Status: AC

## 2021-08-18 NOTE — Patient Instructions (Signed)
[facility]  Discharge Instructions: Thank you for choosing [facility] to provide your oncology and hematology care.   If you have a lab appointment with the Cancer Center, please go directly to the Cancer Center and check in at the registration area.   Wear comfortable clothing and clothing appropriate for easy access to any Portacath or PICC line.   We strive to give you quality time with your provider. You may need to reschedule your appointment if you arrive late (15 or more minutes).  Arriving late affects you and other patients whose appointments are after yours.  Also, if you miss three or more appointments without notifying the office, you may be dismissed from the clinic at the provider's discretion.      For prescription refill requests, have your pharmacy contact our office and allow 72 hours for refills to be completed.   Today you received the following chemotherapy and/or immunotherapy agents Vidaza     To help prevent nausea and vomiting after your treatment, we encourage you to take your nausea medication as directed.  BELOW ARE SYMPTOMS THAT SHOULD BE REPORTED IMMEDIATELY: *FEVER GREATER THAN 100.4 F (38 C) OR HIGHER *CHILLS OR SWEATING *NAUSEA AND VOMITING THAT IS NOT CONTROLLED WITH YOUR NAUSEA MEDICATION *UNUSUAL SHORTNESS OF BREATH *UNUSUAL BRUISING OR BLEEDING *URINARY PROBLEMS (pain or burning when urinating, or frequent urination) *BOWEL PROBLEMS (unusual diarrhea, constipation, pain near the anus) TENDERNESS IN MOUTH AND THROAT WITH OR WITHOUT PRESENCE OF ULCERS (sore throat, sores in mouth, or a toothache) UNUSUAL RASH, SWELLING OR PAIN  UNUSUAL VAGINAL DISCHARGE OR ITCHING   Items with * indicate a potential emergency and should be followed up as soon as possible or go to the Emergency Department if any problems should occur.  Please show the CHEMOTHERAPY ALERT CARD or IMMUNOTHERAPY ALERT CARD at check-in to the  Emergency Department and triage nurse.  Should you have questions after your visit or need to cancel or reschedule your appointment, please contact [facility]  Dept: [phone]  and follow the prompts.  Office hours are 8:00 a.m. to 4:30 p.m. Monday - Friday. Please note that voicemails left after 4:00 p.m. may not be returned until the following business day.  We are closed weekends and major holidays. You have access to a nurse at all times for urgent questions. Please call the main number to the clinic Dept: [phone] and follow the prompts.   For any non-urgent questions, you may also contact your provider using MyChart. We now offer e-Visits for anyone 18 and older to request care online for non-urgent symptoms. For details visit [email or web address].   Also download the MyChart app! Go to the app store, search "MyChart", open the app, select [facility], and log in with your MyChart username and password.  Due to Covid, a mask is required upon entering the hospital/clinic. If you do not have a mask, one will be given to you upon arrival. For doctor visits, patients may have 1 support person aged 18 or older with them. For treatment visits, patients cannot have anyone with them due to current Covid guidelines and our immunocompromised population.   

## 2021-08-19 ENCOUNTER — Inpatient Hospital Stay: Payer: Medicare HMO

## 2021-08-19 VITALS — BP 124/70 | HR 100 | Temp 97.2°F | Resp 18 | Wt 143.0 lb

## 2021-08-19 DIAGNOSIS — R519 Headache, unspecified: Secondary | ICD-10-CM | POA: Diagnosis not present

## 2021-08-19 DIAGNOSIS — I10 Essential (primary) hypertension: Secondary | ICD-10-CM | POA: Diagnosis not present

## 2021-08-19 DIAGNOSIS — E1142 Type 2 diabetes mellitus with diabetic polyneuropathy: Secondary | ICD-10-CM | POA: Diagnosis not present

## 2021-08-19 DIAGNOSIS — C9310 Chronic myelomonocytic leukemia not having achieved remission: Secondary | ICD-10-CM

## 2021-08-19 DIAGNOSIS — Z7984 Long term (current) use of oral hypoglycemic drugs: Secondary | ICD-10-CM | POA: Diagnosis not present

## 2021-08-19 DIAGNOSIS — R69 Illness, unspecified: Secondary | ICD-10-CM | POA: Diagnosis not present

## 2021-08-19 DIAGNOSIS — D649 Anemia, unspecified: Secondary | ICD-10-CM | POA: Diagnosis not present

## 2021-08-19 DIAGNOSIS — D693 Immune thrombocytopenic purpura: Secondary | ICD-10-CM | POA: Diagnosis not present

## 2021-08-19 DIAGNOSIS — E538 Deficiency of other specified B group vitamins: Secondary | ICD-10-CM | POA: Diagnosis not present

## 2021-08-19 DIAGNOSIS — Z5111 Encounter for antineoplastic chemotherapy: Secondary | ICD-10-CM | POA: Diagnosis not present

## 2021-08-19 MED ORDER — HEPARIN SOD (PORK) LOCK FLUSH 100 UNIT/ML IV SOLN
500.0000 [IU] | Freq: Once | INTRAVENOUS | Status: AC | PRN
  Administered 2021-08-19: 10:00:00 500 [IU]

## 2021-08-19 MED ORDER — SODIUM CHLORIDE 0.9 % IV SOLN
10.0000 mg | Freq: Once | INTRAVENOUS | Status: AC
  Administered 2021-08-19: 09:00:00 10 mg via INTRAVENOUS
  Filled 2021-08-19: qty 10

## 2021-08-19 MED ORDER — SODIUM CHLORIDE 0.9 % IV SOLN: Freq: Once | INTRAVENOUS | Status: AC

## 2021-08-19 MED ORDER — SODIUM CHLORIDE 0.9 % IV SOLN
75.0000 mg/m2 | Freq: Once | INTRAVENOUS | Status: AC
  Administered 2021-08-19: 10:00:00 135 mg via INTRAVENOUS
  Filled 2021-08-19: qty 13.5

## 2021-08-19 MED ORDER — SODIUM CHLORIDE 0.9% FLUSH
10.0000 mL | INTRAVENOUS | Status: DC | PRN
  Administered 2021-08-19: 10:00:00 10 mL

## 2021-08-19 MED FILL — Dexamethasone Sodium Phosphate Inj 100 MG/10ML: INTRAMUSCULAR | Qty: 1 | Status: AC

## 2021-08-19 NOTE — Patient Instructions (Signed)
[facility]  Discharge Instructions: Thank you for choosing [facility] to provide your oncology and hematology care.   If you have a lab appointment with the Cancer Center, please go directly to the Cancer Center and check in at the registration area.   Wear comfortable clothing and clothing appropriate for easy access to any Portacath or PICC line.   We strive to give you quality time with your provider. You may need to reschedule your appointment if you arrive late (15 or more minutes).  Arriving late affects you and other patients whose appointments are after yours.  Also, if you miss three or more appointments without notifying the office, you may be dismissed from the clinic at the provider's discretion.      For prescription refill requests, have your pharmacy contact our office and allow 72 hours for refills to be completed.   Today you received the following chemotherapy and/or immunotherapy agents Vidaza     To help prevent nausea and vomiting after your treatment, we encourage you to take your nausea medication as directed.  BELOW ARE SYMPTOMS THAT SHOULD BE REPORTED IMMEDIATELY: *FEVER GREATER THAN 100.4 F (38 C) OR HIGHER *CHILLS OR SWEATING *NAUSEA AND VOMITING THAT IS NOT CONTROLLED WITH YOUR NAUSEA MEDICATION *UNUSUAL SHORTNESS OF BREATH *UNUSUAL BRUISING OR BLEEDING *URINARY PROBLEMS (pain or burning when urinating, or frequent urination) *BOWEL PROBLEMS (unusual diarrhea, constipation, pain near the anus) TENDERNESS IN MOUTH AND THROAT WITH OR WITHOUT PRESENCE OF ULCERS (sore throat, sores in mouth, or a toothache) UNUSUAL RASH, SWELLING OR PAIN  UNUSUAL VAGINAL DISCHARGE OR ITCHING   Items with * indicate a potential emergency and should be followed up as soon as possible or go to the Emergency Department if any problems should occur.  Please show the CHEMOTHERAPY ALERT CARD or IMMUNOTHERAPY ALERT CARD at check-in to the  Emergency Department and triage nurse.  Should you have questions after your visit or need to cancel or reschedule your appointment, please contact [facility]  Dept: [phone]  and follow the prompts.  Office hours are 8:00 a.m. to 4:30 p.m. Monday - Friday. Please note that voicemails left after 4:00 p.m. may not be returned until the following business day.  We are closed weekends and major holidays. You have access to a nurse at all times for urgent questions. Please call the main number to the clinic Dept: [phone] and follow the prompts.   For any non-urgent questions, you may also contact your provider using MyChart. We now offer e-Visits for anyone 18 and older to request care online for non-urgent symptoms. For details visit [email or web address].   Also download the MyChart app! Go to the app store, search "MyChart", open the app, select , and log in with your MyChart username and password.  Due to Covid, a mask is required upon entering the hospital/clinic. If you do not have a mask, one will be given to you upon arrival. For doctor visits, patients may have 1 support person aged 18 or older with them. For treatment visits, patients cannot have anyone with them due to current Covid guidelines and our immunocompromised population.   

## 2021-08-20 ENCOUNTER — Inpatient Hospital Stay: Payer: Medicare HMO

## 2021-08-20 ENCOUNTER — Other Ambulatory Visit: Payer: Self-pay

## 2021-08-20 VITALS — BP 123/69 | HR 90 | Temp 98.2°F | Resp 18

## 2021-08-20 DIAGNOSIS — Z7984 Long term (current) use of oral hypoglycemic drugs: Secondary | ICD-10-CM | POA: Diagnosis not present

## 2021-08-20 DIAGNOSIS — I10 Essential (primary) hypertension: Secondary | ICD-10-CM | POA: Diagnosis not present

## 2021-08-20 DIAGNOSIS — C9310 Chronic myelomonocytic leukemia not having achieved remission: Secondary | ICD-10-CM

## 2021-08-20 DIAGNOSIS — E538 Deficiency of other specified B group vitamins: Secondary | ICD-10-CM | POA: Diagnosis not present

## 2021-08-20 DIAGNOSIS — D649 Anemia, unspecified: Secondary | ICD-10-CM | POA: Diagnosis not present

## 2021-08-20 DIAGNOSIS — Z5111 Encounter for antineoplastic chemotherapy: Secondary | ICD-10-CM | POA: Diagnosis not present

## 2021-08-20 DIAGNOSIS — R519 Headache, unspecified: Secondary | ICD-10-CM | POA: Diagnosis not present

## 2021-08-20 DIAGNOSIS — D693 Immune thrombocytopenic purpura: Secondary | ICD-10-CM | POA: Diagnosis not present

## 2021-08-20 DIAGNOSIS — R69 Illness, unspecified: Secondary | ICD-10-CM | POA: Diagnosis not present

## 2021-08-20 DIAGNOSIS — E1142 Type 2 diabetes mellitus with diabetic polyneuropathy: Secondary | ICD-10-CM | POA: Diagnosis not present

## 2021-08-20 MED ORDER — SODIUM CHLORIDE 0.9% FLUSH
10.0000 mL | INTRAVENOUS | Status: DC | PRN
  Administered 2021-08-20: 10 mL

## 2021-08-20 MED ORDER — HEPARIN SOD (PORK) LOCK FLUSH 100 UNIT/ML IV SOLN
500.0000 [IU] | Freq: Once | INTRAVENOUS | Status: AC | PRN
  Administered 2021-08-20: 500 [IU]

## 2021-08-20 MED ORDER — PALONOSETRON HCL INJECTION 0.25 MG/5ML
0.2500 mg | Freq: Once | INTRAVENOUS | Status: AC
  Administered 2021-08-20: 0.25 mg via INTRAVENOUS
  Filled 2021-08-20: qty 5

## 2021-08-20 MED ORDER — SODIUM CHLORIDE 0.9 % IV SOLN: Freq: Once | INTRAVENOUS | Status: AC

## 2021-08-20 MED ORDER — SODIUM CHLORIDE 0.9 % IV SOLN
75.0000 mg/m2 | Freq: Once | INTRAVENOUS | Status: AC
  Administered 2021-08-20: 135 mg via INTRAVENOUS
  Filled 2021-08-20: qty 13.5

## 2021-08-20 MED ORDER — SODIUM CHLORIDE 0.9 % IV SOLN
10.0000 mg | Freq: Once | INTRAVENOUS | Status: AC
  Administered 2021-08-20: 10 mg via INTRAVENOUS
  Filled 2021-08-20: qty 10

## 2021-08-20 NOTE — Patient Instructions (Signed)
[facility]  Discharge Instructions: Thank you for choosing [facility] to provide your oncology and hematology care.   If you have a lab appointment with the Cancer Center, please go directly to the Cancer Center and check in at the registration area.   Wear comfortable clothing and clothing appropriate for easy access to any Portacath or PICC line.   We strive to give you quality time with your provider. You may need to reschedule your appointment if you arrive late (15 or more minutes).  Arriving late affects you and other patients whose appointments are after yours.  Also, if you miss three or more appointments without notifying the office, you may be dismissed from the clinic at the provider's discretion.      For prescription refill requests, have your pharmacy contact our office and allow 72 hours for refills to be completed.   Today you received the following chemotherapy and/or immunotherapy agents Vidaza     To help prevent nausea and vomiting after your treatment, we encourage you to take your nausea medication as directed.  BELOW ARE SYMPTOMS THAT SHOULD BE REPORTED IMMEDIATELY: *FEVER GREATER THAN 100.4 F (38 C) OR HIGHER *CHILLS OR SWEATING *NAUSEA AND VOMITING THAT IS NOT CONTROLLED WITH YOUR NAUSEA MEDICATION *UNUSUAL SHORTNESS OF BREATH *UNUSUAL BRUISING OR BLEEDING *URINARY PROBLEMS (pain or burning when urinating, or frequent urination) *BOWEL PROBLEMS (unusual diarrhea, constipation, pain near the anus) TENDERNESS IN MOUTH AND THROAT WITH OR WITHOUT PRESENCE OF ULCERS (sore throat, sores in mouth, or a toothache) UNUSUAL RASH, SWELLING OR PAIN  UNUSUAL VAGINAL DISCHARGE OR ITCHING   Items with * indicate a potential emergency and should be followed up as soon as possible or go to the Emergency Department if any problems should occur.  Please show the CHEMOTHERAPY ALERT CARD or IMMUNOTHERAPY ALERT CARD at check-in to the  Emergency Department and triage nurse.  Should you have questions after your visit or need to cancel or reschedule your appointment, please contact [facility]  Dept: [phone]  and follow the prompts.  Office hours are 8:00 a.m. to 4:30 p.m. Monday - Friday. Please note that voicemails left after 4:00 p.m. may not be returned until the following business day.  We are closed weekends and major holidays. You have access to a nurse at all times for urgent questions. Please call the main number to the clinic Dept: [phone] and follow the prompts.   For any non-urgent questions, you may also contact your provider using MyChart. We now offer e-Visits for anyone 18 and older to request care online for non-urgent symptoms. For details visit [email or web address].   Also download the MyChart app! Go to the app store, search "MyChart", open the app, select [facility], and log in with your MyChart username and password.  Due to Covid, a mask is required upon entering the hospital/clinic. If you do not have a mask, one will be given to you upon arrival. For doctor visits, patients may have 1 support person aged 18 or older with them. For treatment visits, patients cannot have anyone with them due to current Covid guidelines and our immunocompromised population.   

## 2021-08-23 ENCOUNTER — Other Ambulatory Visit: Payer: Self-pay

## 2021-08-23 ENCOUNTER — Inpatient Hospital Stay: Payer: Medicare HMO

## 2021-08-23 VITALS — BP 110/69 | HR 84 | Temp 98.8°F | Resp 18

## 2021-08-23 DIAGNOSIS — Z5111 Encounter for antineoplastic chemotherapy: Secondary | ICD-10-CM | POA: Diagnosis not present

## 2021-08-23 DIAGNOSIS — C9310 Chronic myelomonocytic leukemia not having achieved remission: Secondary | ICD-10-CM

## 2021-08-23 DIAGNOSIS — Z95828 Presence of other vascular implants and grafts: Secondary | ICD-10-CM

## 2021-08-23 DIAGNOSIS — E538 Deficiency of other specified B group vitamins: Secondary | ICD-10-CM | POA: Diagnosis not present

## 2021-08-23 DIAGNOSIS — D696 Thrombocytopenia, unspecified: Secondary | ICD-10-CM

## 2021-08-23 DIAGNOSIS — Z7984 Long term (current) use of oral hypoglycemic drugs: Secondary | ICD-10-CM | POA: Diagnosis not present

## 2021-08-23 DIAGNOSIS — D693 Immune thrombocytopenic purpura: Secondary | ICD-10-CM | POA: Diagnosis not present

## 2021-08-23 DIAGNOSIS — R519 Headache, unspecified: Secondary | ICD-10-CM | POA: Diagnosis not present

## 2021-08-23 DIAGNOSIS — R69 Illness, unspecified: Secondary | ICD-10-CM | POA: Diagnosis not present

## 2021-08-23 DIAGNOSIS — D649 Anemia, unspecified: Secondary | ICD-10-CM | POA: Diagnosis not present

## 2021-08-23 DIAGNOSIS — E1142 Type 2 diabetes mellitus with diabetic polyneuropathy: Secondary | ICD-10-CM | POA: Diagnosis not present

## 2021-08-23 DIAGNOSIS — I10 Essential (primary) hypertension: Secondary | ICD-10-CM | POA: Diagnosis not present

## 2021-08-23 LAB — CBC WITH DIFFERENTIAL (CANCER CENTER ONLY)
Abs Immature Granulocytes: 0.15 10*3/uL — ABNORMAL HIGH (ref 0.00–0.07)
Basophils Absolute: 0.1 10*3/uL (ref 0.0–0.1)
Basophils Relative: 2 %
Eosinophils Absolute: 0.1 10*3/uL (ref 0.0–0.5)
Eosinophils Relative: 1 %
HCT: 30 % — ABNORMAL LOW (ref 36.0–46.0)
Hemoglobin: 9.3 g/dL — ABNORMAL LOW (ref 12.0–15.0)
Immature Granulocytes: 2 %
Lymphocytes Relative: 12 %
Lymphs Abs: 0.8 10*3/uL (ref 0.7–4.0)
MCH: 25.2 pg — ABNORMAL LOW (ref 26.0–34.0)
MCHC: 31 g/dL (ref 30.0–36.0)
MCV: 81.3 fL (ref 80.0–100.0)
Monocytes Absolute: 0.6 10*3/uL (ref 0.1–1.0)
Monocytes Relative: 9 %
Neutro Abs: 4.7 10*3/uL (ref 1.7–7.7)
Neutrophils Relative %: 74 %
Platelet Count: 99 10*3/uL — ABNORMAL LOW (ref 150–400)
RBC: 3.69 MIL/uL — ABNORMAL LOW (ref 3.87–5.11)
RDW: 20.1 % — ABNORMAL HIGH (ref 11.5–15.5)
WBC Count: 6.4 10*3/uL (ref 4.0–10.5)
nRBC: 0 % (ref 0.0–0.2)

## 2021-08-23 LAB — SAMPLE TO BLOOD BANK

## 2021-08-23 MED ORDER — ROMIPLOSTIM INJECTION 500 MCG
10.0000 ug/kg | Freq: Once | SUBCUTANEOUS | Status: AC
  Administered 2021-08-23: 650 ug via SUBCUTANEOUS
  Filled 2021-08-23: qty 1

## 2021-08-23 MED ORDER — SODIUM CHLORIDE 0.9% FLUSH
10.0000 mL | Freq: Once | INTRAVENOUS | Status: AC | PRN
  Administered 2021-08-23: 10 mL

## 2021-08-23 MED ORDER — SODIUM CHLORIDE 0.9% FLUSH
10.0000 mL | INTRAVENOUS | Status: DC | PRN
  Administered 2021-08-23: 10 mL via INTRAVENOUS

## 2021-08-23 MED ORDER — HEPARIN SOD (PORK) LOCK FLUSH 100 UNIT/ML IV SOLN
500.0000 [IU] | Freq: Once | INTRAVENOUS | Status: AC | PRN
  Administered 2021-08-23: 500 [IU]

## 2021-08-23 NOTE — Progress Notes (Signed)
RBC 9.3, PLT 99. Per parameters put in place by Burr Medico MD, pt does not need blood products today. MD notified. Pt deaccessed.  ?

## 2021-08-30 ENCOUNTER — Other Ambulatory Visit: Payer: Self-pay

## 2021-08-30 ENCOUNTER — Inpatient Hospital Stay: Payer: Medicare HMO

## 2021-08-30 VITALS — BP 122/66 | HR 79 | Temp 98.0°F | Resp 18

## 2021-08-30 DIAGNOSIS — D649 Anemia, unspecified: Secondary | ICD-10-CM | POA: Diagnosis not present

## 2021-08-30 DIAGNOSIS — E1142 Type 2 diabetes mellitus with diabetic polyneuropathy: Secondary | ICD-10-CM | POA: Diagnosis not present

## 2021-08-30 DIAGNOSIS — C9310 Chronic myelomonocytic leukemia not having achieved remission: Secondary | ICD-10-CM | POA: Diagnosis not present

## 2021-08-30 DIAGNOSIS — I10 Essential (primary) hypertension: Secondary | ICD-10-CM | POA: Diagnosis not present

## 2021-08-30 DIAGNOSIS — D693 Immune thrombocytopenic purpura: Secondary | ICD-10-CM | POA: Diagnosis not present

## 2021-08-30 DIAGNOSIS — R519 Headache, unspecified: Secondary | ICD-10-CM | POA: Diagnosis not present

## 2021-08-30 DIAGNOSIS — Z95828 Presence of other vascular implants and grafts: Secondary | ICD-10-CM

## 2021-08-30 DIAGNOSIS — R69 Illness, unspecified: Secondary | ICD-10-CM | POA: Diagnosis not present

## 2021-08-30 DIAGNOSIS — Z5111 Encounter for antineoplastic chemotherapy: Secondary | ICD-10-CM | POA: Diagnosis not present

## 2021-08-30 DIAGNOSIS — E538 Deficiency of other specified B group vitamins: Secondary | ICD-10-CM | POA: Diagnosis not present

## 2021-08-30 DIAGNOSIS — D696 Thrombocytopenia, unspecified: Secondary | ICD-10-CM

## 2021-08-30 DIAGNOSIS — Z7984 Long term (current) use of oral hypoglycemic drugs: Secondary | ICD-10-CM | POA: Diagnosis not present

## 2021-08-30 LAB — CBC WITH DIFFERENTIAL (CANCER CENTER ONLY)
Abs Immature Granulocytes: 0.21 10*3/uL — ABNORMAL HIGH (ref 0.00–0.07)
Basophils Absolute: 0.1 10*3/uL (ref 0.0–0.1)
Basophils Relative: 1 %
Eosinophils Absolute: 0.1 10*3/uL (ref 0.0–0.5)
Eosinophils Relative: 1 %
HCT: 29.7 % — ABNORMAL LOW (ref 36.0–46.0)
Hemoglobin: 9.3 g/dL — ABNORMAL LOW (ref 12.0–15.0)
Immature Granulocytes: 2 %
Lymphocytes Relative: 10 %
Lymphs Abs: 1.1 10*3/uL (ref 0.7–4.0)
MCH: 25.5 pg — ABNORMAL LOW (ref 26.0–34.0)
MCHC: 31.3 g/dL (ref 30.0–36.0)
MCV: 81.4 fL (ref 80.0–100.0)
Monocytes Absolute: 1.6 10*3/uL — ABNORMAL HIGH (ref 0.1–1.0)
Monocytes Relative: 15 %
Neutro Abs: 7.7 10*3/uL (ref 1.7–7.7)
Neutrophils Relative %: 71 %
Platelet Count: 69 10*3/uL — ABNORMAL LOW (ref 150–400)
RBC: 3.65 MIL/uL — ABNORMAL LOW (ref 3.87–5.11)
RDW: 20.2 % — ABNORMAL HIGH (ref 11.5–15.5)
WBC Count: 10.9 10*3/uL — ABNORMAL HIGH (ref 4.0–10.5)
nRBC: 0 % (ref 0.0–0.2)

## 2021-08-30 LAB — SAMPLE TO BLOOD BANK

## 2021-08-30 MED ORDER — SODIUM CHLORIDE 0.9% FLUSH
10.0000 mL | INTRAVENOUS | Status: AC | PRN
  Administered 2021-08-30: 10 mL

## 2021-08-30 MED ORDER — ROMIPLOSTIM INJECTION 500 MCG
10.0000 ug/kg | Freq: Once | SUBCUTANEOUS | Status: AC
  Administered 2021-08-30: 650 ug via SUBCUTANEOUS
  Filled 2021-08-30: qty 1

## 2021-09-04 ENCOUNTER — Other Ambulatory Visit: Payer: Self-pay | Admitting: Family Medicine

## 2021-09-06 ENCOUNTER — Inpatient Hospital Stay: Payer: Medicare HMO

## 2021-09-06 ENCOUNTER — Other Ambulatory Visit: Payer: Self-pay

## 2021-09-06 VITALS — BP 141/72 | HR 105 | Temp 98.2°F | Resp 18

## 2021-09-06 DIAGNOSIS — Z5111 Encounter for antineoplastic chemotherapy: Secondary | ICD-10-CM | POA: Diagnosis not present

## 2021-09-06 DIAGNOSIS — D649 Anemia, unspecified: Secondary | ICD-10-CM | POA: Diagnosis not present

## 2021-09-06 DIAGNOSIS — C9310 Chronic myelomonocytic leukemia not having achieved remission: Secondary | ICD-10-CM | POA: Diagnosis not present

## 2021-09-06 DIAGNOSIS — R519 Headache, unspecified: Secondary | ICD-10-CM | POA: Diagnosis not present

## 2021-09-06 DIAGNOSIS — Z7984 Long term (current) use of oral hypoglycemic drugs: Secondary | ICD-10-CM | POA: Diagnosis not present

## 2021-09-06 DIAGNOSIS — D696 Thrombocytopenia, unspecified: Secondary | ICD-10-CM

## 2021-09-06 DIAGNOSIS — Z95828 Presence of other vascular implants and grafts: Secondary | ICD-10-CM

## 2021-09-06 DIAGNOSIS — E538 Deficiency of other specified B group vitamins: Secondary | ICD-10-CM | POA: Diagnosis not present

## 2021-09-06 DIAGNOSIS — D693 Immune thrombocytopenic purpura: Secondary | ICD-10-CM | POA: Diagnosis not present

## 2021-09-06 DIAGNOSIS — E1142 Type 2 diabetes mellitus with diabetic polyneuropathy: Secondary | ICD-10-CM | POA: Diagnosis not present

## 2021-09-06 DIAGNOSIS — R69 Illness, unspecified: Secondary | ICD-10-CM | POA: Diagnosis not present

## 2021-09-06 DIAGNOSIS — I10 Essential (primary) hypertension: Secondary | ICD-10-CM | POA: Diagnosis not present

## 2021-09-06 LAB — CBC WITH DIFFERENTIAL (CANCER CENTER ONLY)
Abs Immature Granulocytes: 0.06 10*3/uL (ref 0.00–0.07)
Basophils Absolute: 0 10*3/uL (ref 0.0–0.1)
Basophils Relative: 0 %
Eosinophils Absolute: 0 10*3/uL (ref 0.0–0.5)
Eosinophils Relative: 0 %
HCT: 29.6 % — ABNORMAL LOW (ref 36.0–46.0)
Hemoglobin: 9.2 g/dL — ABNORMAL LOW (ref 12.0–15.0)
Immature Granulocytes: 1 %
Lymphocytes Relative: 11 %
Lymphs Abs: 0.8 10*3/uL (ref 0.7–4.0)
MCH: 25.7 pg — ABNORMAL LOW (ref 26.0–34.0)
MCHC: 31.1 g/dL (ref 30.0–36.0)
MCV: 82.7 fL (ref 80.0–100.0)
Monocytes Absolute: 0.9 10*3/uL (ref 0.1–1.0)
Monocytes Relative: 13 %
Neutro Abs: 5.4 10*3/uL (ref 1.7–7.7)
Neutrophils Relative %: 75 %
Platelet Count: 80 10*3/uL — ABNORMAL LOW (ref 150–400)
RBC: 3.58 MIL/uL — ABNORMAL LOW (ref 3.87–5.11)
RDW: 20.9 % — ABNORMAL HIGH (ref 11.5–15.5)
WBC Count: 7.2 10*3/uL (ref 4.0–10.5)
nRBC: 0 % (ref 0.0–0.2)

## 2021-09-06 LAB — SAMPLE TO BLOOD BANK

## 2021-09-06 MED ORDER — SODIUM CHLORIDE 0.9% FLUSH
10.0000 mL | INTRAVENOUS | Status: DC | PRN
  Administered 2021-09-06: 10 mL via INTRAVENOUS

## 2021-09-06 MED ORDER — ROMIPLOSTIM INJECTION 500 MCG
650.0000 ug | Freq: Once | SUBCUTANEOUS | Status: AC
  Administered 2021-09-06: 650 ug via SUBCUTANEOUS
  Filled 2021-09-06: qty 1

## 2021-09-06 MED ORDER — METOPROLOL SUCCINATE ER 25 MG PO TB24: 25.0000 mg | ORAL_TABLET | Freq: Every day | ORAL | 0 refills | Status: DC

## 2021-09-07 ENCOUNTER — Ambulatory Visit (INDEPENDENT_AMBULATORY_CARE_PROVIDER_SITE_OTHER): Payer: Medicare HMO

## 2021-09-07 ENCOUNTER — Telehealth: Payer: Self-pay

## 2021-09-07 DIAGNOSIS — Z1211 Encounter for screening for malignant neoplasm of colon: Secondary | ICD-10-CM | POA: Diagnosis not present

## 2021-09-07 DIAGNOSIS — E1142 Type 2 diabetes mellitus with diabetic polyneuropathy: Secondary | ICD-10-CM

## 2021-09-07 DIAGNOSIS — Z0000 Encounter for general adult medical examination without abnormal findings: Secondary | ICD-10-CM | POA: Diagnosis not present

## 2021-09-07 DIAGNOSIS — Z0100 Encounter for examination of eyes and vision without abnormal findings: Secondary | ICD-10-CM

## 2021-09-07 DIAGNOSIS — E119 Type 2 diabetes mellitus without complications: Secondary | ICD-10-CM

## 2021-09-07 NOTE — Patient Instructions (Signed)

## 2021-09-07 NOTE — Telephone Encounter (Signed)
Pt states that she is in need of test strips and lancets for her diabetic meter. Spoke with pharmacy and was told that the insurance will not pay for the Rx that was sent because it does not have specific instructions and needs to have Dx code for them as well. Pharmacist also states that it has to be renewed q 6 months. ?

## 2021-09-07 NOTE — Progress Notes (Signed)
? ?Subjective:  ? Kathleen Gibson is a 71 y.o. female who presents for an Initial Medicare Annual Wellness Visit. ? ? ?I connected with  [name] on 09/07/21 by an audio only telemedicine application and verified that I am speaking with the correct person using two identifiers. ?  ?I discussed the limitations, risks, security and privacy concerns of performing an evaluation and management service by telephone and the availability of in person appointments. I also discussed with the patient that there may be a patient responsible charge related to this service. The patient expressed understanding and verbally consented to this telephonic visit. ? ?Location of Patient: home ?Location of Provider: office ? ?List any persons and their role that are participating in the visit with the patient.  ? ?Deleah Tison ?[facility] ? ?Review of Systems    ?Defer to pcp ?Cardiac Risk Factors include: advanced age (>53mn, >>59women) ? ?   ?Objective:  ?  ?Today's Vitals  ? 09/07/21 0914  ?PainSc: 4   ? ?There is no height or weight on file to calculate BMI. ? ? ?  09/07/2021  ?  9:17 AM 08/18/2021  ?  9:49 AM 07/26/2021  ?  1:00 PM 07/08/2021  ? 10:30 AM 06/11/2021  ?  8:44 AM 04/27/2021  ?  9:43 AM 04/01/2021  ?  8:30 AM  ?Advanced Directives  ?Does Patient Have a Medical Advance Directive? No No No No No No Yes  ?Does patient want to make changes to medical advance directive?  No - Patient declined No - Patient declined No - Patient declined No - Patient declined No - Patient declined No - Patient declined  ?Would patient like information on creating a medical advance directive?  No - Patient declined Yes (MAU/Ambulatory/Procedural Areas - Information given)      ? ? ?Current Medications (verified) ?Outpatient Encounter Medications as of 09/07/2021  ?Medication Sig  ? acetaminophen (TYLENOL) 500 MG tablet Take 2 tablets (1,000 mg total) by mouth every 8 (eight) hours.  ? ammonium lactate (LAC-HYDRIN) 12 % lotion Apply 1 application  topically 2 (two) times daily as needed for dry skin.  ? bisacodyl (DULCOLAX) 10 MG suppository Place 1 suppository (10 mg total) rectally daily as needed for moderate constipation (Please take-if no bowel movement in 1-2 days with MiraLAX/senna).  ? cyanocobalamin (,VITAMIN B-12,) 1000 MCG/ML injection Inject 1,000 mcg into the muscle every 30 (thirty) days.  ? gabapentin (NEURONTIN) 300 MG capsule Take 2 capsules (600 mg total) by mouth 2 (two) times daily.  ? glucose blood (ONETOUCH VERIO) test strip Use as instructed  ? Lancets (ONETOUCH ULTRASOFT) lancets Use as instructed  ? lidocaine-prilocaine (EMLA) cream Apply 1 application topically as needed. Apply 2 gm to Port-a-Cath site 30 minutes to 1 hour prior to Port-a-Cath access.  ? LORazepam (ATIVAN) 1 MG tablet TAKE 1 TABLET BY MOUTH EVERY 8 HOURS AS NEEDED FOR ANXIETY  ? metoprolol succinate (TOPROL-XL) 25 MG 24 hr tablet Take 1 tablet (25 mg total) by mouth daily.  ? naloxone (NARCAN) nasal spray 4 mg/0.1 mL Place 1 spray into the nose once as needed (overdose).  ? ondansetron (ZOFRAN-ODT) 4 MG disintegrating tablet Take 4 mg by mouth every 8 (eight) hours as needed for nausea or vomiting.  ? Oxycodone HCl 10 MG TABS 1 tab every 4-6 hr prn for chronic pain (Patient taking differently: Take 10 mg by mouth every 4 (four) hours as needed (chronic pain). 1 tab every 4-6 hr prn  for chronic pain)  ? PARoxetine (PAXIL) 10 MG tablet Take 10 mg by mouth every morning.  ? polyethylene glycol powder (GLYCOLAX/MIRALAX) 17 GM/SCOOP powder Dissolve 1 capful (17 g) in water and drink 2 (two) times daily. (Patient taking differently: Take 17 g by mouth daily.)  ? senna-docusate (SENOKOT-S) 8.6-50 MG tablet Take 2 tablets by mouth at bedtime. (Patient taking differently: Take 2 tablets by mouth at bedtime as needed for mild constipation.)  ? sitaGLIPtin-metformin (JANUMET) 50-1000 MG tablet Take 1 tablet by mouth at bedtime.  ? ?Facility-Administered Encounter Medications as  of 09/07/2021  ?Medication  ? 0.9 %  sodium chloride infusion (Manually program via Guardrails IV Fluids)  ? ? ?Allergies (verified) ?Asa [aspirin] and Nsaids  ? ?History: ?Past Medical History:  ?Diagnosis Date  ? Diabetes ([place])   ? HLD (hyperlipidemia)   ? Hypertension   ? Pernicious anemia   ? Pneumonia 09/05/2019  ? Renal insufficiency 09/06/2019  ? Vitamin D deficiency   ? ?Past Surgical History:  ?Procedure Laterality Date  ? ABDOMINAL HERNIA REPAIR  2009  ? ABDOMINOPLASTY    ? AXILLARY LYMPH NODE BIOPSY Left 11/11/2020  ? Procedure: LEFT AXILLARY EXCISIONAL LYMPH NODE BIOPSY;  Surgeon: [name], MD;  Location: [facility];  Service: General;  Laterality: Left;  ? [place]  ? GASTRIC BYPASS  2000  ? hemorroid surgery  2007  ? IR IMAGING GUIDED PORT INSERTION  07/26/2021  ? IR KYPHO EA ADDL LEVEL THORACIC OR LUMBAR  05/31/2021  ? IR KYPHO THORACIC WITH BONE BIOPSY  05/31/2021  ? TONSILLECTOMY AND ADENOIDECTOMY  1957  ? ?Family History  ?Problem Relation Age of Onset  ? Cancer Mother 85  ?     unknown type cancer   ? Hypertension Mother   ? Hypertension Father   ? Heart failure Father   ? Pneumonia Father   ? Diabetes Sister   ? Leukemia Brother   ? Colon cancer Brother 31  ? Heart Problems Brother   ? Leukemia Other   ? Diabetes Sister   ? Heart Problems Sister   ? Cancer Brother   ? Heart Problems Brother   ? ?Social History  ? ?Socioeconomic History  ? Marital status: Divorced  ?  Spouse name: Not on file  ? Number of children: 3  ? Years of education: 57  ? Highest education level: Not on file  ?Occupational History  ? Occupation: Retired  ?Tobacco Use  ? Smoking status: Never  ? Smokeless tobacco: Never  ?Vaping Use  ? Vaping Use: Never used  ?Substance and Sexual Activity  ? Alcohol use: No  ? Drug use: No  ? Sexual activity: Not on file  ?Other Topics Concern  ? Not on file  ?Social History Narrative  ? Divorced. Retired Pension scheme manager.  ? 12th grade education.  ? Drinks caffeine.  ? Smoke  alarm in the home. Wears her seatbelt.  ? Feels safe in her relationships.  ? Two story home  ? Right handed   ? ?Social Determinants of Health  ? ?Financial Resource Strain: Low Risk   ? Difficulty of Paying Living Expenses: Not hard at all  ?Food Insecurity: No Food Insecurity  ? Worried About Charity fundraiser in the Last Year: Never true  ? Ran Out of Food in the Last Year: Never true  ?Transportation Needs: No Transportation Needs  ? Lack of Transportation (Medical): No  ? Lack of Transportation (Non-Medical): No  ?Physical Activity:  Inactive  ? Days of Exercise per Week: 0 days  ? Minutes of Exercise per Session: 0 min  ?Stress: No Stress Concern Present  ? Feeling of Stress : Not at all  ?Social Connections: Socially Isolated  ? Frequency of Communication with Friends and Family: More than three times a week  ? Frequency of Social Gatherings with Friends and Family: More than three times a week  ? Attends Religious Services: Never  ? Active Member of Clubs or Organizations: No  ? Attends Archivist Meetings: Never  ? Marital Status: Divorced  ? ? ?Tobacco Counseling ?Counseling given: Not Answered ? ? ?Clinical Intake: ? ?Pre-visit preparation completed: No ? ?Pain : 0-10 ?Pain Score: 4  ?Pain Location: Rib cage ? ?  ? ?Diabetes: Yes ?CBG done?: No ?Did pt. bring in CBG monitor from home?: No ? ?How often do you need to have someone help you when you read instructions, pamphlets, or other written materials from your doctor or pharmacy?: 1 - Never ? ?Diabetic?yes ? ?Interpreter Needed?: No ? ?  ? ? ?Activities of Daily Living ? ?  09/07/2021  ?  9:16 AM 07/26/2021  ?  1:18 PM  ?In your present state of health, do you have any difficulty performing the following activities:  ?Hearing? 0 0  ?Vision? 1 0  ?Difficulty concentrating or making decisions? 0 0  ?Walking or climbing stairs? 0 1  ?Dressing or bathing? 0 0  ?Doing errands, shopping? 0   ?Preparing Food and eating ? N   ?Using the Toilet? N    ?In the past six months, have you accidently leaked urine? N   ?Do you have problems with loss of bowel control? N   ?Managing your Medications? N   ?Managing your Finances? N   ?Housekeeping or managing yo

## 2021-09-08 ENCOUNTER — Telehealth: Payer: Self-pay | Admitting: Family Medicine

## 2021-09-08 MED ORDER — ONETOUCH VERIO VI STRP: ORAL_STRIP | 5 refills | Status: DC

## 2021-09-08 MED ORDER — ONETOUCH ULTRASOFT LANCETS MISC: 5 refills | Status: DC

## 2021-09-08 NOTE — Addendum Note (Signed)
Addended by: Howard Pouch A on: 09/08/2021 12:42 PM ? ? Modules accepted: Orders ? ?

## 2021-09-08 NOTE — Telephone Encounter (Signed)
Noted. Thanks.

## 2021-09-08 NOTE — Telephone Encounter (Signed)
Corrected prescription sent by staff. ?

## 2021-09-08 NOTE — Addendum Note (Signed)
Addended by: Kavin Leech on: 09/08/2021 09:17 AM ? ? Modules accepted: Orders ? ?

## 2021-09-08 NOTE — Telephone Encounter (Signed)
Medication pending. Please advise on instructions and dx.  ?

## 2021-09-08 NOTE — Telephone Encounter (Signed)
FYI: ?Kathleen Gibson from ([facility]) ? ?Called said they do not accept medicaid. ?They can't accept patient. ?Pt needs new referral that accepts her insurance. ?

## 2021-09-08 NOTE — Telephone Encounter (Signed)
Noted  

## 2021-09-08 NOTE — Telephone Encounter (Signed)
Referral was placed yesterday. Can you assist with this? ?

## 2021-09-09 DIAGNOSIS — E611 Iron deficiency: Secondary | ICD-10-CM | POA: Diagnosis not present

## 2021-09-09 DIAGNOSIS — Z79899 Other long term (current) drug therapy: Secondary | ICD-10-CM | POA: Diagnosis not present

## 2021-09-09 DIAGNOSIS — R69 Illness, unspecified: Secondary | ICD-10-CM | POA: Diagnosis not present

## 2021-09-09 DIAGNOSIS — M549 Dorsalgia, unspecified: Secondary | ICD-10-CM | POA: Diagnosis not present

## 2021-09-09 DIAGNOSIS — Z9889 Other specified postprocedural states: Secondary | ICD-10-CM | POA: Diagnosis not present

## 2021-09-09 DIAGNOSIS — Z79891 Long term (current) use of opiate analgesic: Secondary | ICD-10-CM | POA: Diagnosis not present

## 2021-09-09 DIAGNOSIS — C9310 Chronic myelomonocytic leukemia not having achieved remission: Secondary | ICD-10-CM | POA: Diagnosis not present

## 2021-09-09 DIAGNOSIS — M545 Low back pain, unspecified: Secondary | ICD-10-CM | POA: Diagnosis not present

## 2021-09-13 ENCOUNTER — Encounter: Payer: Self-pay | Admitting: Hematology

## 2021-09-13 ENCOUNTER — Inpatient Hospital Stay (HOSPITAL_BASED_OUTPATIENT_CLINIC_OR_DEPARTMENT_OTHER): Payer: Medicare HMO | Admitting: Hematology

## 2021-09-13 ENCOUNTER — Inpatient Hospital Stay: Payer: Medicare HMO

## 2021-09-13 ENCOUNTER — Inpatient Hospital Stay: Payer: Medicare HMO | Attending: Hematology

## 2021-09-13 ENCOUNTER — Other Ambulatory Visit: Payer: Self-pay

## 2021-09-13 VITALS — BP 115/70 | HR 90 | Temp 98.0°F | Resp 18 | Ht 63.0 in | Wt 145.0 lb

## 2021-09-13 DIAGNOSIS — G629 Polyneuropathy, unspecified: Secondary | ICD-10-CM | POA: Insufficient documentation

## 2021-09-13 DIAGNOSIS — R768 Other specified abnormal immunological findings in serum: Secondary | ICD-10-CM | POA: Diagnosis not present

## 2021-09-13 DIAGNOSIS — I10 Essential (primary) hypertension: Secondary | ICD-10-CM | POA: Diagnosis not present

## 2021-09-13 DIAGNOSIS — Z95828 Presence of other vascular implants and grafts: Secondary | ICD-10-CM

## 2021-09-13 DIAGNOSIS — D693 Immune thrombocytopenic purpura: Secondary | ICD-10-CM | POA: Diagnosis not present

## 2021-09-13 DIAGNOSIS — D649 Anemia, unspecified: Secondary | ICD-10-CM | POA: Diagnosis not present

## 2021-09-13 DIAGNOSIS — R519 Headache, unspecified: Secondary | ICD-10-CM | POA: Diagnosis not present

## 2021-09-13 DIAGNOSIS — R69 Illness, unspecified: Secondary | ICD-10-CM | POA: Diagnosis not present

## 2021-09-13 DIAGNOSIS — C9310 Chronic myelomonocytic leukemia not having achieved remission: Secondary | ICD-10-CM

## 2021-09-13 DIAGNOSIS — F410 Panic disorder [episodic paroxysmal anxiety] without agoraphobia: Secondary | ICD-10-CM | POA: Diagnosis not present

## 2021-09-13 DIAGNOSIS — E1142 Type 2 diabetes mellitus with diabetic polyneuropathy: Secondary | ICD-10-CM | POA: Diagnosis not present

## 2021-09-13 DIAGNOSIS — Z5111 Encounter for antineoplastic chemotherapy: Secondary | ICD-10-CM | POA: Diagnosis not present

## 2021-09-13 DIAGNOSIS — D696 Thrombocytopenia, unspecified: Secondary | ICD-10-CM

## 2021-09-13 DIAGNOSIS — Z79899 Other long term (current) drug therapy: Secondary | ICD-10-CM | POA: Diagnosis not present

## 2021-09-13 DIAGNOSIS — E538 Deficiency of other specified B group vitamins: Secondary | ICD-10-CM | POA: Diagnosis not present

## 2021-09-13 DIAGNOSIS — E1169 Type 2 diabetes mellitus with other specified complication: Secondary | ICD-10-CM | POA: Diagnosis not present

## 2021-09-13 LAB — CBC WITH DIFFERENTIAL (CANCER CENTER ONLY)
Abs Immature Granulocytes: 0.04 10*3/uL (ref 0.00–0.07)
Basophils Absolute: 0 10*3/uL (ref 0.0–0.1)
Basophils Relative: 1 %
Eosinophils Absolute: 0 10*3/uL (ref 0.0–0.5)
Eosinophils Relative: 0 %
HCT: 29 % — ABNORMAL LOW (ref 36.0–46.0)
Hemoglobin: 9 g/dL — ABNORMAL LOW (ref 12.0–15.0)
Immature Granulocytes: 1 %
Lymphocytes Relative: 20 %
Lymphs Abs: 1 10*3/uL (ref 0.7–4.0)
MCH: 25.9 pg — ABNORMAL LOW (ref 26.0–34.0)
MCHC: 31 g/dL (ref 30.0–36.0)
MCV: 83.3 fL (ref 80.0–100.0)
Monocytes Absolute: 0.8 10*3/uL (ref 0.1–1.0)
Monocytes Relative: 17 %
Neutro Abs: 3 10*3/uL (ref 1.7–7.7)
Neutrophils Relative %: 61 %
Platelet Count: 200 10*3/uL (ref 150–400)
RBC: 3.48 MIL/uL — ABNORMAL LOW (ref 3.87–5.11)
RDW: 20.5 % — ABNORMAL HIGH (ref 11.5–15.5)
WBC Count: 4.9 10*3/uL (ref 4.0–10.5)
nRBC: 0 % (ref 0.0–0.2)

## 2021-09-13 LAB — CMP (CANCER CENTER ONLY)
ALT: 16 U/L (ref 0–44)
AST: 15 U/L (ref 15–41)
Albumin: 3.3 g/dL — ABNORMAL LOW (ref 3.5–5.0)
Alkaline Phosphatase: 117 U/L (ref 38–126)
Anion gap: 6 (ref 5–15)
BUN: 17 mg/dL (ref 8–23)
CO2: 26 mmol/L (ref 22–32)
Calcium: 8.2 mg/dL — ABNORMAL LOW (ref 8.9–10.3)
Chloride: 108 mmol/L (ref 98–111)
Creatinine: 0.6 mg/dL (ref 0.44–1.00)
GFR, Estimated: >60 mL/min (ref >60)
Glucose, Bld: 276 mg/dL — ABNORMAL HIGH (ref 70–99)
Potassium: 3.9 mmol/L (ref 3.5–5.1)
Sodium: 140 mmol/L (ref 135–145)
Total Bilirubin: 0.6 mg/dL (ref 0.3–1.2)
Total Protein: 6.6 g/dL (ref 6.5–8.1)

## 2021-09-13 LAB — SAMPLE TO BLOOD BANK

## 2021-09-13 MED ORDER — SODIUM CHLORIDE 0.9 % IV SOLN: Freq: Once | INTRAVENOUS | Status: AC

## 2021-09-13 MED ORDER — PALONOSETRON HCL INJECTION 0.25 MG/5ML
0.2500 mg | Freq: Once | INTRAVENOUS | Status: AC
  Administered 2021-09-13: 0.25 mg via INTRAVENOUS
  Filled 2021-09-13: qty 5

## 2021-09-13 MED ORDER — HEPARIN SOD (PORK) LOCK FLUSH 100 UNIT/ML IV SOLN
500.0000 [IU] | Freq: Once | INTRAVENOUS | Status: AC | PRN
  Administered 2021-09-13: 500 [IU]

## 2021-09-13 MED ORDER — ROMIPLOSTIM INJECTION 500 MCG
9.0000 ug/kg | Freq: Once | SUBCUTANEOUS | Status: AC
  Administered 2021-09-13: 590 ug via SUBCUTANEOUS
  Filled 2021-09-13: qty 1

## 2021-09-13 MED ORDER — CYANOCOBALAMIN 1000 MCG/ML IJ SOLN
1000.0000 ug | Freq: Once | INTRAMUSCULAR | Status: AC
  Administered 2021-09-13: 1000 ug via INTRAMUSCULAR
  Filled 2021-09-13: qty 1

## 2021-09-13 MED ORDER — SODIUM CHLORIDE 0.9% FLUSH
10.0000 mL | INTRAVENOUS | Status: DC | PRN
  Administered 2021-09-13: 10 mL

## 2021-09-13 MED ORDER — SODIUM CHLORIDE 0.9 % IV SOLN
10.0000 mg | Freq: Once | INTRAVENOUS | Status: AC
  Administered 2021-09-13: 10 mg via INTRAVENOUS
  Filled 2021-09-13: qty 10

## 2021-09-13 MED ORDER — SODIUM CHLORIDE 0.9 % IV SOLN
75.0000 mg/m2 | Freq: Once | INTRAVENOUS | Status: AC
  Administered 2021-09-13: 135 mg via INTRAVENOUS
  Filled 2021-09-13: qty 13.5

## 2021-09-13 MED ORDER — SODIUM CHLORIDE 0.9% FLUSH
10.0000 mL | INTRAVENOUS | Status: AC | PRN
  Administered 2021-09-13: 10 mL

## 2021-09-13 NOTE — Progress Notes (Signed)
?[place]   ?[phone] [phone]   ?Clinic Follow up Note  ? ?Patient Care Team: ?[name], DO as PCP - General (Family Medicine) ?Doran Stabler, MD as Consulting Physician (Gastroenterology) ?Truitt Merle, MD as Consulting Physician (Hematology) ?Alda Berthold, DO as Consulting Physician (Neurology) ? ?Date of Service:  09/13/2021 ? ?CHIEF COMPLAINT: f/u of [name], severe thrombocytopenia ? ?CURRENT THERAPY:  ?Azacitidine days 1-5 q28 days, started 12/28/20 ?Nplate 56mg/kg weekly, dose reduced since 07/19/21 when plt normalized  ?Platelet transfusion as needed (plt<15K), blood transfusion if Hg<8.0 ? ?ASSESSMENT & PLAN:  ?Kathleen MCQUEARYis a 71y.o. female with  ? ?1. [name] with severe thrombocytopenia  ?-Initially diagnosed with ITP in 2014. She lost follow up after 11/2017 and did not proceed with recommended bone marrow biopsy. ?-She was referred to ED 10/28/20 by her PCP for low plt count of 4k. She was hospitalized and treated with platelet transfusions, IVIG, oral prednisone 643mand Nplate injections. She tapered off prednisone given little response. ?-Bone marrow biopsy 10/30/20 felt to likely represent MDS/MPN, particularly [name].  [facility]-She began treatment for severe refractory thrombocytopenia with weekly Rituxan x4 on 11/19/20, she did not respond  ?-She began azacitidine daily days 1-5 q. 28 days on 12/28/20 ?-She had worsening thrombocytopenia after stopping Nplate, restarted weekly Nplate 10 mcg/kg on 12/16/76/93-Bone marrow biopsy 03/18/21 showed stable disease, no increase in blasts. ?-her platelet count has fluctuated and was high enough for kyphoplasty procedure on 05/31/21.  ?-she last saw Dr. PaLinus Ornn 08/12/21. They will hold on BMB given her good hematological response to chemo.  ?-labs reviewed, plt 200k today, which is WNL. She will proceed with Vidaza today and daily for the rest of this  week. ?-Continue weekly CBC and Nplate injection ?-Follow-up in 4 weeks before her next cycle of chemo. ?-[place]. She discussed she continue Nplate weekly while there (recall her daughter-in-law works at the [employer] there). ?  ?2.  Thoracic spine compression fractures ?-T10 and T12 kyphoplasty performed 05/31/21 ?-she reports improvement in pain and no longer uses thoracic brace. ?-will hold on Zometa for now until she sees dentist  ?-I advised her to restart calcium and vit D. ?  ?3. Normocytic anemia, moderate  ?-H/o pernicious anemia/vitamin B12 deficiency ?-Transfuse for Hgb </= 7.5 ?-Continue oral iron. I advised her to start oral B12 as well. ?-she was previously getting B12 injections due to prior gastric bypass surgery, last 06/2021. We have not checked her B12 level since 10/2020. We will give her one dose today and check her level at her next visit. ?  ?4. Comorbidities: DM type 2 with polyneuropathy, HTN, h/o panic attack, headaches ?-Continue medication, follow-up PCP ?-Etiology of headaches unknown, managed with Tylenol ?-panic attack occurred once, managed with family support and lorazepam. ?  ?  ?PLAN: ?-proceed with azacitidine, Nplate, and B12 today, will slightly reduce Nplate dose to 57m78mkg  ?-continue azacitidine daily for the rest of this week as scheduled  ?-[place]. I recommend her to get lab and Nplate injection weekly over there  ?-next cycle Vidaza infusion daily X5, starting in 4 weeks ?-F/u in 4 weeks ?-Transfusion as needed ? ? ?No problem-specific Assessment & Plan notes found for this encounter. ? ? ?SUMMARY OF ONCOLOGIC HISTORY: ?Oncology History  ?[name] (chronic myelomonocytic  leukemia) ([place])  ?10/29/2020 Imaging  ? CT CAP ? ?IMPRESSION: ?1. Splenomegaly with pathologically enlarged lymph nodes above and below the diaphragm, with overall stable to minimally increased abdominal adenopathy and interval  progression of the pelvic adenopathy. ?2. Small volume abdominopelvic ascites with diffuse mesenteric ?stranding. ?3. Scattered bilateral pulmonary micro nodules measuring 1-2 mm. ?4. Distended gallbladder with some layering hyperdense material ?representing layering sludge and tiny stones seen on prior ?ultrasound. ?5. Aortic atherosclerosis. ?  ?10/30/2020 Pathology Results  ? DIAGNOSIS:  ? ?BONE MARROW, ASPIRATE, CLOT, CORE:  ?-  Hypercellular bone marrow with panhyperplasia, atypia, and no  ?increase in blasts  ?-  See comment  ? ?PERIPHERAL BLOOD:  ?-  Marked thrombocytopenia  ?-  Absolute monocytosis  ?-  Normocytic anemia  ?-  See CBC data and comment  ? ?COMMENT:  ?The bone marrow is hypercellular for age (approximately 80%) with myeloid hyperplasia with maturational left shift, erythroid hyperplasia, and increased megakaryocytes.  Mild multilineage atypia is present. Blasts are not increased on aspirate smears (1% by manual differential count) or by CD34 immunohistochemistry on the core biopsy.  Concurrent flow cytometric analysis of the bone marrow aspirate demonstrates increased monocytes, and no increase in blasts or abnormal lymphoid population (see [id number]).  Monocytes are also increased in peripheral  ?blood, persistent per electronic medical record.  In aggregate, the  ?findings raise the possibility of a myeloid neoplasm with the  ?differential diagnosis including a low-grade myelodysplastic syndrome and chronic myelomonocytic leukemia (dysplastic type).   ? ?ADDENDUM:  ?A reticulin special stain performed on the bone marrow core biopsy reveals no significant increase in reticulin fibrosis. ? ?ADDENDUM:  ?CYTOGENETIC RESULTS:  ?Karyotype: 46,XX[20]  ?Interpretation: NORMAL FEMALE KARYOTYPE  ? ?FISH RESULTS:  ?Results: NORMAL  ? ?ADDENDUM:  ?CD123 immunohistochemistry performed on the core biopsy highlights scattered aggregates of positively staining cells consistent with plasmacytoid dendritic  cells.  ?  ?10/30/2020 Pathology Results  ? DIAGNOSIS:  ? ?BONE MARROW; FLOW CYTOMETRIC ANALYSIS:  ?-  Increased monocytes  ?-  Scant B-cells present  ?-  No immunophenotypically aberrant T-cell population identified  ?-  No increase in blasts  ?-  See comment  ? ?COMMENT:  ?Monocytes are relatively increased (12% of all cells), without aberrant expression of CD56.  B-cells comprise <1% of total lymphocytes. CD34-positive blasts are not increased (<1% of all cells).  Correlation with concurrent morphology is recommended for complete diagnostic interpretation and overall blast enumeration (see [id number]).  ?  ?10/30/2020 Pathology Results  ? FINAL MICROSCOPIC DIAGNOSIS:  ? ?A. LYMPH NODE, RIGHT AXILLARY, NEEDLE CORE BIOPSY:  ?-Lymphoid tissue present  ?-See comment  ? ?COMMENT:  ?The sections show several small needle core biopsy fragments of lymphoid tissue displaying degenerative cellular changes/necrosis and hence cannot be accurately evaluated.  Sample for flow cytometric analysis not available.  Immunohistochemical stain for CD3 and CD20 were performed with appropriate controls.  There is a mixture of T and B cells in their apparently respective compartments.  There is no definite metastatic malignancy.  ?  ?11/11/2020 Pathology Results  ? DIAGNOSIS:  ? ?LEFT AXILLARY LYMPH NODE EXCISIONAL BIOPSY; FLOW CYTOMETRIC ANALYSIS:  ?-  No monotypic B-cell or immunophenotypically aberrant T-cell  ?population identified  ?-  See comment  ? ?COMMENT:  ?Flow cytometric analysis identifies B-cells with a normal kappa:lambda ratio of 1.7:1.  A subset of the polytypic B-cells expresses CD10. T-cells show a CD4:CD8 ratio of 3.3:1 without immunophenotypic aberrancy with the markers evaluated.  Although these results  do not support the diagnosis of a clonal lymphoid population, sampling issues must always be considered when negative results are obtained, as focal lesions may not be represented in the specimen submitted.  In  addition, flow cytometric immunophenotyping will not exclude other pathology if present (e.g. Hodgkin lymphoma, some T-cell lymphomas, metastatic and infectious diseases). ?  ?11/11/2020 Pathology Results  ? FINAL MICROSCOPIC DI

## 2021-09-13 NOTE — Patient Instructions (Signed)
[facility] MEDICAL [facility]COLOG[facility]  Discharge Instructions: Thank you for choosing [facility] to provide your oncology and hematology care.   If you have a lab appointment with the Cancer Center, please go directly to the Cancer Center and check in at the registration area.   Wear comfortable clothing and clothing appropriate for easy access to any Portacath or PICC line.   We strive to give you quality time with your provider. You may need to reschedule your appointment if you arrive late (15 or more minutes).  Arriving late affects you and other patients whose appointments are after yours.  Also, if you miss three or more appointments without notifying the office, you may be dismissed from the clinic at the provider's discretion.      For prescription refill requests, have your pharmacy contact our office and allow 72 hours for refills to be completed.   Today you received the following chemotherapy and/or immunotherapy agents Vidaza     To help prevent nausea and vomiting after your treatment, we encourage you to take your nausea medication as directed.  BELOW ARE SYMPTOMS THAT SHOULD BE REPORTED IMMEDIATELY: *FEVER GREATER THAN 100.4 F (38 C) OR HIGHER *CHILLS OR SWEATING *NAUSEA AND VOMITING THAT IS NOT CONTROLLED WITH YOUR NAUSEA MEDICATION *UNUSUAL SHORTNESS OF BREATH *UNUSUAL BRUISING OR BLEEDING *URINARY PROBLEMS (pain or burning when urinating, or frequent urination) *BOWEL PROBLEMS (unusual diarrhea, constipation, pain near the anus) TENDERNESS IN MOUTH AND THROAT WITH OR WITHOUT PRESENCE OF ULCERS (sore throat, sores in mouth, or a toothache) UNUSUAL RASH, SWELLING OR PAIN  UNUSUAL VAGINAL DISCHARGE OR ITCHING   Items with * indicate a potential emergency and should be followed up as soon as possible or go to the Emergency Department if any problems should occur.  Please show the CHEMOTHERAPY ALERT CARD or IMMUNOTHERAPY ALERT CARD at check-in to the  Emergency Department and triage nurse.  Should you have questions after your visit or need to cancel or reschedule your appointment, please contact [facility]  Dept: [phone]  and follow the prompts.  Office hours are 8:00 a.m. to 4:30 p.m. Monday - Friday. Please note that voicemails left after 4:00 p.m. may not be returned until the following business day.  We are closed weekends and major holidays. You have access to a nurse at all times for urgent questions. Please call the main number to the clinic Dept: [phone] and follow the prompts.   For any non-urgent questions, you may also contact your provider using MyChart. We now offer e-Visits for anyone 18 and older to request care online for non-urgent symptoms. For details visit [email or web address].   Also download the MyChart app! Go to the app store, search "MyChart", open the app, select [place], and log in with your MyChart username and password.  Due to Covid, a mask is required upon entering the hospital/clinic. If you do not have a mask, one will be given to you upon arrival. For doctor visits, patients may have 1 support Kenna Kirn aged 18 or older with them. For treatment visits, patients cannot have anyone with them due to current Covid guidelines and our immunocompromised population.   

## 2021-09-14 ENCOUNTER — Inpatient Hospital Stay: Payer: Medicare HMO

## 2021-09-14 VITALS — BP 128/66 | HR 89 | Temp 97.8°F | Resp 18

## 2021-09-14 DIAGNOSIS — D649 Anemia, unspecified: Secondary | ICD-10-CM | POA: Diagnosis not present

## 2021-09-14 DIAGNOSIS — E538 Deficiency of other specified B group vitamins: Secondary | ICD-10-CM | POA: Diagnosis not present

## 2021-09-14 DIAGNOSIS — I10 Essential (primary) hypertension: Secondary | ICD-10-CM | POA: Diagnosis not present

## 2021-09-14 DIAGNOSIS — R69 Illness, unspecified: Secondary | ICD-10-CM | POA: Diagnosis not present

## 2021-09-14 DIAGNOSIS — C9310 Chronic myelomonocytic leukemia not having achieved remission: Secondary | ICD-10-CM

## 2021-09-14 DIAGNOSIS — E1142 Type 2 diabetes mellitus with diabetic polyneuropathy: Secondary | ICD-10-CM | POA: Diagnosis not present

## 2021-09-14 DIAGNOSIS — Z5111 Encounter for antineoplastic chemotherapy: Secondary | ICD-10-CM | POA: Diagnosis not present

## 2021-09-14 DIAGNOSIS — Z79899 Other long term (current) drug therapy: Secondary | ICD-10-CM | POA: Diagnosis not present

## 2021-09-14 DIAGNOSIS — D693 Immune thrombocytopenic purpura: Secondary | ICD-10-CM | POA: Diagnosis not present

## 2021-09-14 DIAGNOSIS — G629 Polyneuropathy, unspecified: Secondary | ICD-10-CM | POA: Diagnosis not present

## 2021-09-14 DIAGNOSIS — R519 Headache, unspecified: Secondary | ICD-10-CM | POA: Diagnosis not present

## 2021-09-14 MED ORDER — SODIUM CHLORIDE 0.9% FLUSH
10.0000 mL | INTRAVENOUS | Status: DC | PRN
  Administered 2021-09-14 (×2): 10 mL

## 2021-09-14 MED ORDER — SODIUM CHLORIDE 0.9 % IV SOLN
75.0000 mg/m2 | Freq: Once | INTRAVENOUS | Status: AC
  Administered 2021-09-14: 135 mg via INTRAVENOUS
  Filled 2021-09-14: qty 13.5

## 2021-09-14 MED ORDER — SODIUM CHLORIDE 0.9 % IV SOLN
10.0000 mg | Freq: Once | INTRAVENOUS | Status: AC
  Administered 2021-09-14: 10 mg via INTRAVENOUS
  Filled 2021-09-14: qty 10

## 2021-09-14 MED ORDER — HEPARIN SOD (PORK) LOCK FLUSH 100 UNIT/ML IV SOLN
500.0000 [IU] | Freq: Once | INTRAVENOUS | Status: AC | PRN
  Administered 2021-09-14: 500 [IU]

## 2021-09-14 MED ORDER — SODIUM CHLORIDE 0.9 % IV SOLN: Freq: Once | INTRAVENOUS | Status: AC

## 2021-09-14 NOTE — Patient Instructions (Addendum)
[facility]  Discharge Instructions: ?Thank you for choosing [facility] to provide your oncology and hematology care.  ? ?If you have a lab appointment with the [facility], please go directly to the [facility] and check in at the registration area. ?  ?Wear comfortable clothing and clothing appropriate for easy access to any Portacath or PICC line.  ? ?We strive to give you quality time with your provider. You may need to reschedule your appointment if you arrive late (15 or more minutes).  Arriving late affects you and other patients whose appointments are after yours.  Also, if you miss three or more appointments without notifying the office, you may be dismissed from the clinic at the provider?s discretion.    ?  ?For prescription refill requests, have your pharmacy contact our office and allow 72 hours for refills to be completed.   ? ?Today you received the following chemotherapy and/or immunotherapy agent: Vidaza (azacitidine)   ?  ?To help prevent nausea and vomiting after your treatment, we encourage you to take your nausea medication as directed. ? ?BELOW ARE SYMPTOMS THAT SHOULD BE REPORTED IMMEDIATELY: ?*FEVER GREATER THAN 100.4 F (38 ?C) OR HIGHER ?*CHILLS OR SWEATING ?*NAUSEA AND VOMITING THAT IS NOT CONTROLLED WITH YOUR NAUSEA MEDICATION ?*UNUSUAL SHORTNESS OF BREATH ?*UNUSUAL BRUISING OR BLEEDING ?*URINARY PROBLEMS (pain or burning when urinating, or frequent urination) ?*BOWEL PROBLEMS (unusual diarrhea, constipation, pain near the anus) ?TENDERNESS IN MOUTH AND THROAT WITH OR WITHOUT PRESENCE OF ULCERS (sore throat, sores in mouth, or a toothache) ?UNUSUAL RASH, SWELLING OR PAIN  ?UNUSUAL VAGINAL DISCHARGE OR ITCHING  ? ?Items with * indicate a potential emergency and should be followed up as soon as possible or go to the Emergency Department if any problems should occur. ? ?Please show the CHEMOTHERAPY ALERT CARD or IMMUNOTHERAPY ALERT CARD at  check-in to the Emergency Department and triage nurse. ? ?Should you have questions after your visit or need to cancel or reschedule your appointment, please contact [facility]  Dept: [phone]  and follow the prompts.  Office hours are 8:00 a.m. to 4:30 p.m. Monday - Friday. Please note that voicemails left after 4:00 p.m. may not be returned until the following business day.  We are closed weekends and major holidays. You have access to a nurse at all times for urgent questions. Please call the main number to the clinic Dept: [phone] and follow the prompts. ? ? ?For any non-urgent questions, you may also contact your provider using MyChart. We now offer e-Visits for anyone 13 and older to request care online for non-urgent symptoms. For details visit [email or web address]. ?  ?Also download the MyChart app! Go to the app store, search "MyChart", open the app, select [facility], and log in with your MyChart username and password. ? ?Due to Covid, a mask is required upon entering the hospital/clinic. If you do not have a mask, one will be given to you upon arrival. For doctor visits, patients may have 1 support person aged 4 or older with them. For treatment visits, patients cannot have anyone with them due to current Covid guidelines and our immunocompromised population.  ? ?

## 2021-09-15 ENCOUNTER — Inpatient Hospital Stay: Payer: Medicare HMO

## 2021-09-15 ENCOUNTER — Other Ambulatory Visit: Payer: Self-pay

## 2021-09-15 VITALS — BP 134/73 | HR 84 | Temp 97.9°F | Resp 17

## 2021-09-15 DIAGNOSIS — C9310 Chronic myelomonocytic leukemia not having achieved remission: Secondary | ICD-10-CM | POA: Diagnosis not present

## 2021-09-15 DIAGNOSIS — R69 Illness, unspecified: Secondary | ICD-10-CM | POA: Diagnosis not present

## 2021-09-15 DIAGNOSIS — Z79899 Other long term (current) drug therapy: Secondary | ICD-10-CM | POA: Diagnosis not present

## 2021-09-15 DIAGNOSIS — R519 Headache, unspecified: Secondary | ICD-10-CM | POA: Diagnosis not present

## 2021-09-15 DIAGNOSIS — E1142 Type 2 diabetes mellitus with diabetic polyneuropathy: Secondary | ICD-10-CM | POA: Diagnosis not present

## 2021-09-15 DIAGNOSIS — G629 Polyneuropathy, unspecified: Secondary | ICD-10-CM | POA: Diagnosis not present

## 2021-09-15 DIAGNOSIS — D649 Anemia, unspecified: Secondary | ICD-10-CM | POA: Diagnosis not present

## 2021-09-15 DIAGNOSIS — E538 Deficiency of other specified B group vitamins: Secondary | ICD-10-CM | POA: Diagnosis not present

## 2021-09-15 DIAGNOSIS — D693 Immune thrombocytopenic purpura: Secondary | ICD-10-CM | POA: Diagnosis not present

## 2021-09-15 DIAGNOSIS — Z5111 Encounter for antineoplastic chemotherapy: Secondary | ICD-10-CM | POA: Diagnosis not present

## 2021-09-15 DIAGNOSIS — I10 Essential (primary) hypertension: Secondary | ICD-10-CM | POA: Diagnosis not present

## 2021-09-15 MED ORDER — HEPARIN SOD (PORK) LOCK FLUSH 100 UNIT/ML IV SOLN
500.0000 [IU] | Freq: Once | INTRAVENOUS | Status: AC | PRN
  Administered 2021-09-15: 500 [IU]

## 2021-09-15 MED ORDER — SODIUM CHLORIDE 0.9% FLUSH
10.0000 mL | INTRAVENOUS | Status: DC | PRN
  Administered 2021-09-15: 10 mL

## 2021-09-15 MED ORDER — PALONOSETRON HCL INJECTION 0.25 MG/5ML
0.2500 mg | Freq: Once | INTRAVENOUS | Status: AC
  Administered 2021-09-15: 0.25 mg via INTRAVENOUS
  Filled 2021-09-15: qty 5

## 2021-09-15 MED ORDER — SODIUM CHLORIDE 0.9 % IV SOLN
75.0000 mg/m2 | Freq: Once | INTRAVENOUS | Status: AC
  Administered 2021-09-15: 135 mg via INTRAVENOUS
  Filled 2021-09-15: qty 13.5

## 2021-09-15 MED ORDER — SODIUM CHLORIDE 0.9 % IV SOLN: Freq: Once | INTRAVENOUS | Status: AC

## 2021-09-15 MED ORDER — SODIUM CHLORIDE 0.9 % IV SOLN
10.0000 mg | Freq: Once | INTRAVENOUS | Status: AC
  Administered 2021-09-15: 10 mg via INTRAVENOUS
  Filled 2021-09-15: qty 10

## 2021-09-15 NOTE — Patient Instructions (Signed)
[facility]  Discharge Instructions: ?Thank you for choosing [facility] to provide your oncology and hematology care.  ? ?If you have a lab appointment with the [facility], please go directly to the [facility] and check in at the registration area. ?  ?Wear comfortable clothing and clothing appropriate for easy access to any Portacath or PICC line.  ? ?We strive to give you quality time with your provider. You may need to reschedule your appointment if you arrive late (15 or more minutes).  Arriving late affects you and other patients whose appointments are after yours.  Also, if you miss three or more appointments without notifying the office, you may be dismissed from the clinic at the provider?s discretion.    ?  ?For prescription refill requests, have your pharmacy contact our office and allow 72 hours for refills to be completed.   ? ?Today you received the following chemotherapy and/or immunotherapy agent: Vidaza (azacitidine) ?  ?To help prevent nausea and vomiting after your treatment, we encourage you to take your nausea medication as directed. ? ?BELOW ARE SYMPTOMS THAT SHOULD BE REPORTED IMMEDIATELY: ?*FEVER GREATER THAN 100.4 F (38 ?C) OR HIGHER ?*CHILLS OR SWEATING ?*NAUSEA AND VOMITING THAT IS NOT CONTROLLED WITH YOUR NAUSEA MEDICATION ?*UNUSUAL SHORTNESS OF BREATH ?*UNUSUAL BRUISING OR BLEEDING ?*URINARY PROBLEMS (pain or burning when urinating, or frequent urination) ?*BOWEL PROBLEMS (unusual diarrhea, constipation, pain near the anus) ?TENDERNESS IN MOUTH AND THROAT WITH OR WITHOUT PRESENCE OF ULCERS (sore throat, sores in mouth, or a toothache) ?UNUSUAL RASH, SWELLING OR PAIN  ?UNUSUAL VAGINAL DISCHARGE OR ITCHING  ? ?Items with * indicate a potential emergency and should be followed up as soon as possible or go to the Emergency Department if any problems should occur. ? ?Please show the CHEMOTHERAPY ALERT CARD or IMMUNOTHERAPY ALERT CARD at  check-in to the Emergency Department and triage nurse. ? ?Should you have questions after your visit or need to cancel or reschedule your appointment, please contact [facility]  Dept: [phone]  and follow the prompts.  Office hours are 8:00 a.m. to 4:30 p.m. Monday - Friday. Please note that voicemails left after 4:00 p.m. may not be returned until the following business day.  We are closed weekends and major holidays. You have access to a nurse at all times for urgent questions. Please call the main number to the clinic Dept: [phone] and follow the prompts. ? ? ?For any non-urgent questions, you may also contact your provider using MyChart. We now offer e-Visits for anyone 49 and older to request care online for non-urgent symptoms. For details visit [email or web address]. ?  ?Also download the MyChart app! Go to the app store, search "MyChart", open the app, select [place], and log in with your MyChart username and password. ? ?Due to Covid, a mask is required upon entering the hospital/clinic. If you do not have a mask, one will be given to you upon arrival. For doctor visits, patients may have 1 support person aged 18 or older with them. For treatment visits, patients cannot have anyone with them due to current Covid guidelines and our immunocompromised population.  ? ?

## 2021-09-16 ENCOUNTER — Inpatient Hospital Stay: Payer: Medicare HMO

## 2021-09-16 VITALS — BP 134/78 | HR 84 | Temp 98.2°F | Resp 18

## 2021-09-16 DIAGNOSIS — G629 Polyneuropathy, unspecified: Secondary | ICD-10-CM | POA: Diagnosis not present

## 2021-09-16 DIAGNOSIS — E1142 Type 2 diabetes mellitus with diabetic polyneuropathy: Secondary | ICD-10-CM | POA: Diagnosis not present

## 2021-09-16 DIAGNOSIS — R519 Headache, unspecified: Secondary | ICD-10-CM | POA: Diagnosis not present

## 2021-09-16 DIAGNOSIS — R69 Illness, unspecified: Secondary | ICD-10-CM | POA: Diagnosis not present

## 2021-09-16 DIAGNOSIS — E538 Deficiency of other specified B group vitamins: Secondary | ICD-10-CM | POA: Diagnosis not present

## 2021-09-16 DIAGNOSIS — Z79899 Other long term (current) drug therapy: Secondary | ICD-10-CM | POA: Diagnosis not present

## 2021-09-16 DIAGNOSIS — D649 Anemia, unspecified: Secondary | ICD-10-CM | POA: Diagnosis not present

## 2021-09-16 DIAGNOSIS — I10 Essential (primary) hypertension: Secondary | ICD-10-CM | POA: Diagnosis not present

## 2021-09-16 DIAGNOSIS — C9310 Chronic myelomonocytic leukemia not having achieved remission: Secondary | ICD-10-CM | POA: Diagnosis not present

## 2021-09-16 DIAGNOSIS — D693 Immune thrombocytopenic purpura: Secondary | ICD-10-CM | POA: Diagnosis not present

## 2021-09-16 DIAGNOSIS — Z5111 Encounter for antineoplastic chemotherapy: Secondary | ICD-10-CM | POA: Diagnosis not present

## 2021-09-16 MED ORDER — SODIUM CHLORIDE 0.9 % IV SOLN: Freq: Once | INTRAVENOUS | Status: AC

## 2021-09-16 MED ORDER — SODIUM CHLORIDE 0.9 % IV SOLN
75.0000 mg/m2 | Freq: Once | INTRAVENOUS | Status: AC
  Administered 2021-09-16: 135 mg via INTRAVENOUS
  Filled 2021-09-16: qty 13.5

## 2021-09-16 MED ORDER — SODIUM CHLORIDE 0.9 % IV SOLN
10.0000 mg | Freq: Once | INTRAVENOUS | Status: AC
  Administered 2021-09-16: 10 mg via INTRAVENOUS
  Filled 2021-09-16: qty 10

## 2021-09-16 NOTE — Patient Instructions (Signed)
[facility]  Discharge Instructions: ?Thank you for choosing [facility] to provide your oncology and hematology care.  ? ?If you have a lab appointment with the [facility], please go directly to the [facility] and check in at the registration area. ?  ?Wear comfortable clothing and clothing appropriate for easy access to any Portacath or PICC line.  ? ?We strive to give you quality time with your provider. You may need to reschedule your appointment if you arrive late (15 or more minutes).  Arriving late affects you and other patients whose appointments are after yours.  Also, if you miss three or more appointments without notifying the office, you may be dismissed from the clinic at the provider?s discretion.    ?  ?For prescription refill requests, have your pharmacy contact our office and allow 72 hours for refills to be completed.   ? ?Today you received the following chemotherapy and/or immunotherapy agent: Vidaza (azacitidine) ?  ?To help prevent nausea and vomiting after your treatment, we encourage you to take your nausea medication as directed. ? ?BELOW ARE SYMPTOMS THAT SHOULD BE REPORTED IMMEDIATELY: ?*FEVER GREATER THAN 100.4 F (38 ?C) OR HIGHER ?*CHILLS OR SWEATING ?*NAUSEA AND VOMITING THAT IS NOT CONTROLLED WITH YOUR NAUSEA MEDICATION ?*UNUSUAL SHORTNESS OF BREATH ?*UNUSUAL BRUISING OR BLEEDING ?*URINARY PROBLEMS (pain or burning when urinating, or frequent urination) ?*BOWEL PROBLEMS (unusual diarrhea, constipation, pain near the anus) ?TENDERNESS IN MOUTH AND THROAT WITH OR WITHOUT PRESENCE OF ULCERS (sore throat, sores in mouth, or a toothache) ?UNUSUAL RASH, SWELLING OR PAIN  ?UNUSUAL VAGINAL DISCHARGE OR ITCHING  ? ?Items with * indicate a potential emergency and should be followed up as soon as possible or go to the Emergency Department if any problems should occur. ? ?Please show the CHEMOTHERAPY ALERT CARD or IMMUNOTHERAPY ALERT CARD at  check-in to the Emergency Department and triage nurse. ? ?Should you have questions after your visit or need to cancel or reschedule your appointment, please contact [facility]  Dept: [phone]  and follow the prompts.  Office hours are 8:00 a.m. to 4:30 p.m. Monday - Friday. Please note that voicemails left after 4:00 p.m. may not be returned until the following business day.  We are closed weekends and major holidays. You have access to a nurse at all times for urgent questions. Please call the main number to the clinic Dept: [phone] and follow the prompts. ? ? ?For any non-urgent questions, you may also contact your provider using MyChart. We now offer e-Visits for anyone 2 and older to request care online for non-urgent symptoms. For details visit [email or web address]. ?  ?Also download the MyChart app! Go to the app store, search "MyChart", open the app, select [place], and log in with your MyChart username and password. ? ?Due to Covid, a mask is required upon entering the hospital/clinic. If you do not have a mask, one will be given to you upon arrival. For doctor visits, patients may have 1 support person aged 70 or older with them. For treatment visits, patients cannot have anyone with them due to current Covid guidelines and our immunocompromised population.  ? ?

## 2021-09-17 ENCOUNTER — Other Ambulatory Visit: Payer: Self-pay

## 2021-09-17 ENCOUNTER — Inpatient Hospital Stay: Payer: Medicare HMO

## 2021-09-17 ENCOUNTER — Telehealth: Payer: Self-pay

## 2021-09-17 VITALS — BP 142/68 | HR 107 | Temp 98.5°F | Resp 18

## 2021-09-17 DIAGNOSIS — D693 Immune thrombocytopenic purpura: Secondary | ICD-10-CM | POA: Diagnosis not present

## 2021-09-17 DIAGNOSIS — C9310 Chronic myelomonocytic leukemia not having achieved remission: Secondary | ICD-10-CM | POA: Diagnosis not present

## 2021-09-17 DIAGNOSIS — R69 Illness, unspecified: Secondary | ICD-10-CM | POA: Diagnosis not present

## 2021-09-17 DIAGNOSIS — I10 Essential (primary) hypertension: Secondary | ICD-10-CM | POA: Diagnosis not present

## 2021-09-17 DIAGNOSIS — E1142 Type 2 diabetes mellitus with diabetic polyneuropathy: Secondary | ICD-10-CM | POA: Diagnosis not present

## 2021-09-17 DIAGNOSIS — E538 Deficiency of other specified B group vitamins: Secondary | ICD-10-CM | POA: Diagnosis not present

## 2021-09-17 DIAGNOSIS — R519 Headache, unspecified: Secondary | ICD-10-CM | POA: Diagnosis not present

## 2021-09-17 DIAGNOSIS — G629 Polyneuropathy, unspecified: Secondary | ICD-10-CM | POA: Diagnosis not present

## 2021-09-17 DIAGNOSIS — Z79899 Other long term (current) drug therapy: Secondary | ICD-10-CM | POA: Diagnosis not present

## 2021-09-17 DIAGNOSIS — D649 Anemia, unspecified: Secondary | ICD-10-CM | POA: Diagnosis not present

## 2021-09-17 DIAGNOSIS — Z5111 Encounter for antineoplastic chemotherapy: Secondary | ICD-10-CM | POA: Diagnosis not present

## 2021-09-17 MED ORDER — SODIUM CHLORIDE 0.9 % IV SOLN
75.0000 mg/m2 | Freq: Once | INTRAVENOUS | Status: AC
  Administered 2021-09-17: 135 mg via INTRAVENOUS
  Filled 2021-09-17: qty 13.5

## 2021-09-17 MED ORDER — SODIUM CHLORIDE 0.9% FLUSH
10.0000 mL | INTRAVENOUS | Status: DC | PRN
  Administered 2021-09-17: 10 mL

## 2021-09-17 MED ORDER — SODIUM CHLORIDE 0.9 % IV SOLN: Freq: Once | INTRAVENOUS | Status: AC

## 2021-09-17 MED ORDER — SODIUM CHLORIDE 0.9 % IV SOLN
10.0000 mg | Freq: Once | INTRAVENOUS | Status: AC
  Administered 2021-09-17: 10 mg via INTRAVENOUS
  Filled 2021-09-17: qty 10

## 2021-09-17 MED ORDER — PALONOSETRON HCL INJECTION 0.25 MG/5ML
0.2500 mg | Freq: Once | INTRAVENOUS | Status: AC
  Administered 2021-09-17: 0.25 mg via INTRAVENOUS
  Filled 2021-09-17: qty 5

## 2021-09-17 MED ORDER — HEPARIN SOD (PORK) LOCK FLUSH 100 UNIT/ML IV SOLN
500.0000 [IU] | Freq: Once | INTRAVENOUS | Status: AC | PRN
  Administered 2021-09-17: 500 [IU]

## 2021-09-17 NOTE — Progress Notes (Signed)
Per Dr. Burr Medico, ok to treat with tachycardia today. ?

## 2021-09-17 NOTE — Telephone Encounter (Signed)
Faxed orders for labs & Nplate, pt demographics, Dr. Ernestina Penna last office note, and information on pt's port to attention mary at [facility] [phone].  The [facility] [phone]) stated they are not able to give NPLATE in their clinic .  They referred this RN to [facility].  Called [facility] scheduler for [facility] and LVM requesting returned telephone call.  Fax confirmation of orders and etc was received.  Requesting appt for Monday 09/20/2021 and Tuesday 09/21/2021.  ?

## 2021-09-20 ENCOUNTER — Inpatient Hospital Stay: Payer: Medicare HMO

## 2021-09-21 DIAGNOSIS — D693 Immune thrombocytopenic purpura: Secondary | ICD-10-CM | POA: Diagnosis not present

## 2021-09-21 DIAGNOSIS — C9310 Chronic myelomonocytic leukemia not having achieved remission: Secondary | ICD-10-CM | POA: Diagnosis not present

## 2021-09-23 ENCOUNTER — Telehealth: Payer: Self-pay

## 2021-09-23 ENCOUNTER — Other Ambulatory Visit: Payer: Self-pay

## 2021-09-23 NOTE — Telephone Encounter (Signed)
Spoke with Mable Fill at [facility] [phone]) regarding extended orders for labs, NPLATE, and B12 Injections.  Faxed orders to [facility] and fax confirmation was received. ?

## 2021-09-24 ENCOUNTER — Other Ambulatory Visit: Payer: Self-pay | Admitting: Hematology

## 2021-09-25 ENCOUNTER — Other Ambulatory Visit: Payer: Self-pay | Admitting: Family Medicine

## 2021-09-27 ENCOUNTER — Inpatient Hospital Stay: Payer: Medicare HMO

## 2021-09-28 ENCOUNTER — Encounter: Payer: Self-pay | Admitting: Hematology

## 2021-09-28 ENCOUNTER — Telehealth: Payer: Self-pay | Admitting: Hematology

## 2021-09-28 DIAGNOSIS — C9310 Chronic myelomonocytic leukemia not having achieved remission: Secondary | ICD-10-CM | POA: Diagnosis not present

## 2021-09-28 DIAGNOSIS — D693 Immune thrombocytopenic purpura: Secondary | ICD-10-CM | POA: Diagnosis not present

## 2021-09-28 NOTE — Telephone Encounter (Signed)
Unable to leave message with follow-up appointments per 4/3 los. Mailed calendar. ?

## 2021-10-05 DIAGNOSIS — C9310 Chronic myelomonocytic leukemia not having achieved remission: Secondary | ICD-10-CM | POA: Diagnosis not present

## 2021-10-05 DIAGNOSIS — D693 Immune thrombocytopenic purpura: Secondary | ICD-10-CM | POA: Diagnosis not present

## 2021-10-08 ENCOUNTER — Encounter: Payer: Self-pay | Admitting: Hematology

## 2021-10-08 ENCOUNTER — Other Ambulatory Visit: Payer: Self-pay

## 2021-10-08 DIAGNOSIS — Z95828 Presence of other vascular implants and grafts: Secondary | ICD-10-CM | POA: Insufficient documentation

## 2021-10-10 ENCOUNTER — Other Ambulatory Visit: Payer: Self-pay | Admitting: Nurse Practitioner

## 2021-10-10 DIAGNOSIS — C9310 Chronic myelomonocytic leukemia not having achieved remission: Secondary | ICD-10-CM

## 2021-10-10 NOTE — Progress Notes (Signed)
?[place]   ?Telephone:(336) [phone] [phone]   ?Clinic Follow up Note  ? ?Patient Care Team: ?[name], DO as PCP - General (Family Medicine) ?Doran Stabler, MD as Consulting Physician (Gastroenterology) ?Truitt Merle, MD as Consulting Physician (Hematology) ?Alda Berthold, DO as Consulting Physician (Neurology) ?10/11/2021 ? ?CHIEF COMPLAINT: Follow up [name] ? ?SUMMARY OF ONCOLOGIC HISTORY: ?Oncology History  ?[name] (chronic myelomonocytic leukemia) ([place])  ?10/29/2020 Imaging  ? CT CAP ? ?IMPRESSION: ?1. Splenomegaly with pathologically enlarged lymph nodes above and below the diaphragm, with overall stable to minimally increased abdominal adenopathy and interval progression of the pelvic adenopathy. ?2. Small volume abdominopelvic ascites with diffuse mesenteric ?stranding. ?3. Scattered bilateral pulmonary micro nodules measuring 1-2 mm. ?4. Distended gallbladder with some layering hyperdense material ?representing layering sludge and tiny stones seen on prior ?ultrasound. ?5. Aortic atherosclerosis. ?  ?10/30/2020 Pathology Results  ? DIAGNOSIS:  ? ?BONE MARROW, ASPIRATE, CLOT, CORE:  ?-  Hypercellular bone marrow with panhyperplasia, atypia, and no  ?increase in blasts  ?-  See comment  ? ?PERIPHERAL BLOOD:  ?-  Marked thrombocytopenia  ?-  Absolute monocytosis  ?-  Normocytic anemia  ?-  See CBC data and comment  ? ?COMMENT:  ?The bone marrow is hypercellular for age (approximately 80%) with myeloid hyperplasia with maturational left shift, erythroid hyperplasia, and increased megakaryocytes.  Mild multilineage atypia is present. Blasts are not increased on aspirate smears (1% by manual differential count) or by CD34 immunohistochemistry on the core biopsy.  Concurrent flow cytometric analysis of the bone marrow aspirate demonstrates increased monocytes, and no increase in blasts or abnormal lymphoid population (see [id number]).  Monocytes are also increased in peripheral   ?blood, persistent per electronic medical record.  In aggregate, the  ?findings raise the possibility of a myeloid neoplasm with the  ?differential diagnosis including a low-grade myelodysplastic syndrome and chronic myelomonocytic leukemia (dysplastic type).   ? ?ADDENDUM:  ?A reticulin special stain performed on the bone marrow core biopsy reveals no significant increase in reticulin fibrosis. ? ?ADDENDUM:  ?CYTOGENETIC RESULTS:  ?Karyotype: 46,XX[20]  ?Interpretation: NORMAL FEMALE KARYOTYPE  ? ?FISH RESULTS:  ?Results: NORMAL  ? ?ADDENDUM:  ?CD123 immunohistochemistry performed on the core biopsy highlights scattered aggregates of positively staining cells consistent with plasmacytoid dendritic cells.  ?  ?10/30/2020 Pathology Results  ? DIAGNOSIS:  ? ?BONE MARROW; FLOW CYTOMETRIC ANALYSIS:  ?-  Increased monocytes  ?-  Scant B-cells present  ?-  No immunophenotypically aberrant T-cell population identified  ?-  No increase in blasts  ?-  See comment  ? ?COMMENT:  ?Monocytes are relatively increased (12% of all cells), without aberrant expression of CD56.  B-cells comprise <1% of total lymphocytes. CD34-positive blasts are not increased (<1% of all cells).  Correlation with concurrent morphology is recommended for complete diagnostic interpretation and overall blast enumeration (see [id number]).  ?  ?10/30/2020 Pathology Results  ? FINAL MICROSCOPIC DIAGNOSIS:  ? ?A. LYMPH NODE, RIGHT AXILLARY, NEEDLE CORE BIOPSY:  ?-Lymphoid tissue present  ?-See comment  ? ?COMMENT:  ?The sections show several small needle core biopsy fragments of lymphoid tissue displaying degenerative cellular changes/necrosis and hence cannot be accurately evaluated.  Sample for flow cytometric analysis not available.  Immunohistochemical stain for CD3 and CD20 were performed with appropriate controls.  There is a mixture of T and B cells in their apparently respective compartments.  There is no definite metastatic malignancy.  ?  ?11/11/2020  Pathology Results  ?  DIAGNOSIS:  ? ?LEFT AXILLARY LYMPH NODE EXCISIONAL BIOPSY; FLOW CYTOMETRIC ANALYSIS:  ?-  No monotypic B-cell or immunophenotypically aberrant T-cell  ?population identified  ?-  See comment  ? ?COMMENT:  ?Flow cytometric analysis identifies B-cells with a normal kappa:lambda ratio of 1.7:1.  A subset of the polytypic B-cells expresses CD10. T-cells show a CD4:CD8 ratio of 3.3:1 without immunophenotypic aberrancy with the markers evaluated.  Although these results do not support the diagnosis of a clonal lymphoid population, sampling issues must always be considered when negative results are obtained, as focal lesions may not be represented in the specimen submitted.  In addition, flow cytometric immunophenotyping will not exclude other pathology if present (e.g. Hodgkin lymphoma, some T-cell lymphomas, metastatic and infectious diseases). ?  ?11/11/2020 Pathology Results  ? FINAL MICROSCOPIC DIAGNOSIS:  ? ?A. LYMPH NODE, LEFT AXILLARY, DISSECTION:  ?-  Follicular hyperplasia with interfollicular expansion  ?-  See comment  ? ?COMMENT:  ?Sections of the lymph nodes reveal generally preserved lymph node architecture.  There is follicular hyperplasia with some follicles showing increased hyalinization of germinal centers and mild concentric encircling of germinal centers with small lymphocytes (onion skinning). Interfollicular areas are expanded with increased vascular proliferation, small lymphocytes, plasma cells, histiocytic cells, and patchy neutrophils.  The lymph node capsule is variably thickened by fibrosis with few plasma cells.  ? ?A panel of immunohistochemical stains is performed for further  ?characterization.  CD3 and CD20/PAX5 highlight T-cell and B-cell  ?compartments, respectively.  Germinal centers are highlighted by CD10 and BCL6 with appropriate absent expression of BCL2.  CD5 is similar to CD3.  CD43 also stains the T-cells.  CD4-positive T-cells exceed CD8-positive T-cells.   CD30 highlights scattered immunoblasts.  CD21 reveals intact follicular dendritic cell meshworks.  CD68 highlights increased histiocytic cells.  HHV 8 is negative.  T. pallidum reveals no definitive organisms.  Pancytokeratin is negative.  TdT stains rare cells.  CD138 highlights plasma cells which are not increased in number and show polytypic light chain expression by kappa/lambda in situ  ?hybridization.  EBV is negative by in situ hybridization.  ? ?Concurrent flow cytometric analysis is negative for a monoclonal B-cell or immunophenotypically aberrant T-cell population (see [id number]).  ? ?Together, the findings above are non-specific and can be seen in  ?reactive and infectious processes as well as [place] disease.  There is no definitive morphologic or flow cytometric evidence of involvement by a lymphoproliferative disorder from the current workup. ?  ?12/17/2020 Initial Diagnosis  ? [name] (chronic myelomonocytic leukemia) ([place]) ? ?  ?12/28/2020 -  Chemotherapy  ? Patient is on Treatment Plan : MYELODYSPLASIA  Azacitidine IV D1-5 q28d  ? ?  ?  ?03/18/2021 Pathology Results  ? Final Diagnosis    ?BONE MARROW: ?            Persistent chronic myelomonocytic leukemia in a hypercellular bone marrow with increased monocytes and no increase in blasts.  ? ?Comment    ?Flow cytometric analysis showed no increase in blasts and 17% monocytes.A next generation myeloid panel showed the presence of the following mutations: NRAS, SH2B3, PHF6 and TET2. CD34 and CD117 immunstains do not show an increase in blasts. The findings are consistent with persistent [name] with a decrease in the number of blasts compared to that seen on the previous bone marrow.   ? ?Final Interpretation    ?  ?BONE MARROW; FLOW CYTOMETRIC ANALYSIS:   ?No increased blasts identified. (see comment)  ?Monocytosis (17% of total events).  ?  ?  COMMENT: ?Flow cytometry identified about 0.6% of total events as immature cells. These cells express CD45 (dim),  CD34, CD117, HLA-DR, CD38, CD33 (variable). This immunophenotype is consistent with normal myeloblasts. In addition, monocytes are increased and comprise of approximately 17% of total events. These monocytes

## 2021-10-11 ENCOUNTER — Inpatient Hospital Stay: Payer: Medicare HMO | Attending: Hematology

## 2021-10-11 ENCOUNTER — Other Ambulatory Visit: Payer: Self-pay

## 2021-10-11 ENCOUNTER — Encounter: Payer: Self-pay | Admitting: Nurse Practitioner

## 2021-10-11 ENCOUNTER — Inpatient Hospital Stay (HOSPITAL_BASED_OUTPATIENT_CLINIC_OR_DEPARTMENT_OTHER): Payer: Medicare HMO | Admitting: Nurse Practitioner

## 2021-10-11 ENCOUNTER — Inpatient Hospital Stay: Payer: Medicare HMO

## 2021-10-11 VITALS — BP 127/72 | HR 107 | Temp 97.4°F | Resp 17 | Wt 139.0 lb

## 2021-10-11 VITALS — HR 93

## 2021-10-11 DIAGNOSIS — I10 Essential (primary) hypertension: Secondary | ICD-10-CM | POA: Diagnosis not present

## 2021-10-11 DIAGNOSIS — D693 Immune thrombocytopenic purpura: Secondary | ICD-10-CM | POA: Diagnosis not present

## 2021-10-11 DIAGNOSIS — D696 Thrombocytopenia, unspecified: Secondary | ICD-10-CM | POA: Diagnosis not present

## 2021-10-11 DIAGNOSIS — Z95828 Presence of other vascular implants and grafts: Secondary | ICD-10-CM

## 2021-10-11 DIAGNOSIS — E538 Deficiency of other specified B group vitamins: Secondary | ICD-10-CM

## 2021-10-11 DIAGNOSIS — Z5111 Encounter for antineoplastic chemotherapy: Secondary | ICD-10-CM | POA: Diagnosis not present

## 2021-10-11 DIAGNOSIS — C9310 Chronic myelomonocytic leukemia not having achieved remission: Secondary | ICD-10-CM

## 2021-10-11 DIAGNOSIS — E1142 Type 2 diabetes mellitus with diabetic polyneuropathy: Secondary | ICD-10-CM | POA: Diagnosis not present

## 2021-10-11 DIAGNOSIS — D649 Anemia, unspecified: Secondary | ICD-10-CM | POA: Diagnosis not present

## 2021-10-11 DIAGNOSIS — Z79899 Other long term (current) drug therapy: Secondary | ICD-10-CM | POA: Insufficient documentation

## 2021-10-11 LAB — CBC WITH DIFFERENTIAL (CANCER CENTER ONLY)
Abs Immature Granulocytes: 0.05 10*3/uL (ref 0.00–0.07)
Basophils Absolute: 0.1 10*3/uL (ref 0.0–0.1)
Basophils Relative: 1 %
Eosinophils Absolute: 0 10*3/uL (ref 0.0–0.5)
Eosinophils Relative: 1 %
HCT: 31.2 % — ABNORMAL LOW (ref 36.0–46.0)
Hemoglobin: 9.7 g/dL — ABNORMAL LOW (ref 12.0–15.0)
Immature Granulocytes: 1 %
Lymphocytes Relative: 14 %
Lymphs Abs: 0.8 10*3/uL (ref 0.7–4.0)
MCH: 25.7 pg — ABNORMAL LOW (ref 26.0–34.0)
MCHC: 31.1 g/dL (ref 30.0–36.0)
MCV: 82.5 fL (ref 80.0–100.0)
Monocytes Absolute: 1 10*3/uL (ref 0.1–1.0)
Monocytes Relative: 16 %
Neutro Abs: 4 10*3/uL (ref 1.7–7.7)
Neutrophils Relative %: 67 %
Platelet Count: 130 10*3/uL — ABNORMAL LOW (ref 150–400)
RBC: 3.78 MIL/uL — ABNORMAL LOW (ref 3.87–5.11)
RDW: 20.5 % — ABNORMAL HIGH (ref 11.5–15.5)
WBC Count: 5.9 10*3/uL (ref 4.0–10.5)
nRBC: 0 % (ref 0.0–0.2)

## 2021-10-11 LAB — CMP (CANCER CENTER ONLY)
ALT: 16 U/L (ref 0–44)
AST: 14 U/L — ABNORMAL LOW (ref 15–41)
Albumin: 3.6 g/dL (ref 3.5–5.0)
Alkaline Phosphatase: 109 U/L (ref 38–126)
Anion gap: 4 — ABNORMAL LOW (ref 5–15)
BUN: 13 mg/dL (ref 8–23)
CO2: 28 mmol/L (ref 22–32)
Calcium: 8.7 mg/dL — ABNORMAL LOW (ref 8.9–10.3)
Chloride: 105 mmol/L (ref 98–111)
Creatinine: 0.66 mg/dL (ref 0.44–1.00)
GFR, Estimated: >60 mL/min (ref >60)
Glucose, Bld: 221 mg/dL — ABNORMAL HIGH (ref 70–99)
Potassium: 4.1 mmol/L (ref 3.5–5.1)
Sodium: 137 mmol/L (ref 135–145)
Total Bilirubin: 0.8 mg/dL (ref 0.3–1.2)
Total Protein: 7 g/dL (ref 6.5–8.1)

## 2021-10-11 LAB — VITAMIN B12: Vitamin B-12: 1444 pg/mL — ABNORMAL HIGH (ref 180–914)

## 2021-10-11 LAB — SAMPLE TO BLOOD BANK

## 2021-10-11 MED ORDER — SODIUM CHLORIDE 0.9 % IV SOLN
75.0000 mg/m2 | Freq: Once | INTRAVENOUS | Status: AC
  Administered 2021-10-11: 135 mg via INTRAVENOUS
  Filled 2021-10-11: qty 13.5

## 2021-10-11 MED ORDER — SODIUM CHLORIDE 0.9 % IV SOLN
10.0000 mg | Freq: Once | INTRAVENOUS | Status: AC
  Administered 2021-10-11: 10 mg via INTRAVENOUS
  Filled 2021-10-11: qty 10

## 2021-10-11 MED ORDER — SODIUM CHLORIDE 0.9% FLUSH
10.0000 mL | INTRAVENOUS | Status: DC | PRN
  Administered 2021-10-11: 10 mL

## 2021-10-11 MED ORDER — PALONOSETRON HCL INJECTION 0.25 MG/5ML
0.2500 mg | Freq: Once | INTRAVENOUS | Status: AC
  Administered 2021-10-11: 0.25 mg via INTRAVENOUS
  Filled 2021-10-11: qty 5

## 2021-10-11 MED ORDER — CYANOCOBALAMIN 1000 MCG/ML IJ SOLN
1000.0000 ug | Freq: Once | INTRAMUSCULAR | Status: AC
  Administered 2021-10-11: 1000 ug via INTRAMUSCULAR
  Filled 2021-10-11: qty 1

## 2021-10-11 MED ORDER — SODIUM CHLORIDE 0.9% FLUSH
10.0000 mL | Freq: Once | INTRAVENOUS | Status: AC
  Administered 2021-10-11: 10 mL

## 2021-10-11 MED ORDER — HEPARIN SOD (PORK) LOCK FLUSH 100 UNIT/ML IV SOLN
500.0000 [IU] | Freq: Once | INTRAVENOUS | Status: AC | PRN
  Administered 2021-10-11: 500 [IU]

## 2021-10-11 MED ORDER — ROMIPLOSTIM INJECTION 500 MCG
590.0000 ug | Freq: Once | SUBCUTANEOUS | Status: AC
  Administered 2021-10-11: 590 ug via SUBCUTANEOUS
  Filled 2021-10-11: qty 1

## 2021-10-11 MED ORDER — SODIUM CHLORIDE 0.9 % IV SOLN: Freq: Once | INTRAVENOUS | Status: AC

## 2021-10-11 NOTE — Patient Instructions (Signed)
[facility]  Discharge Instructions: Thank you for choosing [facility] to provide your oncology and hematology care.   If you have a lab appointment with the Cancer Center, please go directly to the Cancer Center and check in at the registration area.   Wear comfortable clothing and clothing appropriate for easy access to any Portacath or PICC line.   We strive to give you quality time with your provider. You may need to reschedule your appointment if you arrive late (15 or more minutes).  Arriving late affects you and other patients whose appointments are after yours.  Also, if you miss three or more appointments without notifying the office, you may be dismissed from the clinic at the provider's discretion.      For prescription refill requests, have your pharmacy contact our office and allow 72 hours for refills to be completed.   Today you received the following chemotherapy and/or immunotherapy agents Vidaza     To help prevent nausea and vomiting after your treatment, we encourage you to take your nausea medication as directed.  BELOW ARE SYMPTOMS THAT SHOULD BE REPORTED IMMEDIATELY: *FEVER GREATER THAN 100.4 F (38 C) OR HIGHER *CHILLS OR SWEATING *NAUSEA AND VOMITING THAT IS NOT CONTROLLED WITH YOUR NAUSEA MEDICATION *UNUSUAL SHORTNESS OF BREATH *UNUSUAL BRUISING OR BLEEDING *URINARY PROBLEMS (pain or burning when urinating, or frequent urination) *BOWEL PROBLEMS (unusual diarrhea, constipation, pain near the anus) TENDERNESS IN MOUTH AND THROAT WITH OR WITHOUT PRESENCE OF ULCERS (sore throat, sores in mouth, or a toothache) UNUSUAL RASH, SWELLING OR PAIN  UNUSUAL VAGINAL DISCHARGE OR ITCHING   Items with * indicate a potential emergency and should be followed up as soon as possible or go to the Emergency Department if any problems should occur.  Please show the CHEMOTHERAPY ALERT CARD or IMMUNOTHERAPY ALERT CARD at check-in to the  Emergency Department and triage nurse.  Should you have questions after your visit or need to cancel or reschedule your appointment, please contact [facility]  Dept: [phone]  and follow the prompts.  Office hours are 8:00 a.m. to 4:30 p.m. Monday - Friday. Please note that voicemails left after 4:00 p.m. may not be returned until the following business day.  We are closed weekends and major holidays. You have access to a nurse at all times for urgent questions. Please call the main number to the clinic Dept: [phone] and follow the prompts.   For any non-urgent questions, you may also contact your provider using MyChart. We now offer e-Visits for anyone 18 and older to request care online for non-urgent symptoms. For details visit [email or web address].   Also download the MyChart app! Go to the app store, search "MyChart", open the app, select [place], and log in with your MyChart username and password.  Due to Covid, a mask is required upon entering the hospital/clinic. If you do not have a mask, one will be given to you upon arrival. For doctor visits, patients may have 1 support person aged 18 or older with them. For treatment visits, patients cannot have anyone with them due to current Covid guidelines and our immunocompromised population.   

## 2021-10-12 ENCOUNTER — Inpatient Hospital Stay: Payer: Medicare HMO

## 2021-10-12 VITALS — BP 118/53 | HR 82 | Temp 98.3°F | Resp 18

## 2021-10-12 DIAGNOSIS — D649 Anemia, unspecified: Secondary | ICD-10-CM | POA: Diagnosis not present

## 2021-10-12 DIAGNOSIS — Z79899 Other long term (current) drug therapy: Secondary | ICD-10-CM | POA: Diagnosis not present

## 2021-10-12 DIAGNOSIS — E538 Deficiency of other specified B group vitamins: Secondary | ICD-10-CM | POA: Diagnosis not present

## 2021-10-12 DIAGNOSIS — E1142 Type 2 diabetes mellitus with diabetic polyneuropathy: Secondary | ICD-10-CM | POA: Diagnosis not present

## 2021-10-12 DIAGNOSIS — Z5111 Encounter for antineoplastic chemotherapy: Secondary | ICD-10-CM | POA: Diagnosis not present

## 2021-10-12 DIAGNOSIS — D693 Immune thrombocytopenic purpura: Secondary | ICD-10-CM | POA: Diagnosis not present

## 2021-10-12 DIAGNOSIS — I10 Essential (primary) hypertension: Secondary | ICD-10-CM | POA: Diagnosis not present

## 2021-10-12 DIAGNOSIS — C9310 Chronic myelomonocytic leukemia not having achieved remission: Secondary | ICD-10-CM | POA: Diagnosis not present

## 2021-10-12 MED ORDER — SODIUM CHLORIDE 0.9% FLUSH
10.0000 mL | INTRAVENOUS | Status: DC | PRN
  Administered 2021-10-12: 10 mL

## 2021-10-12 MED ORDER — SODIUM CHLORIDE 0.9 % IV SOLN
10.0000 mg | Freq: Once | INTRAVENOUS | Status: AC
  Administered 2021-10-12: 10 mg via INTRAVENOUS
  Filled 2021-10-12: qty 10

## 2021-10-12 MED ORDER — HEPARIN SOD (PORK) LOCK FLUSH 100 UNIT/ML IV SOLN
500.0000 [IU] | Freq: Once | INTRAVENOUS | Status: AC | PRN
  Administered 2021-10-12: 500 [IU]

## 2021-10-12 MED ORDER — SODIUM CHLORIDE 0.9 % IV SOLN: Freq: Once | INTRAVENOUS | Status: AC

## 2021-10-12 MED ORDER — SODIUM CHLORIDE 0.9 % IV SOLN
75.0000 mg/m2 | Freq: Once | INTRAVENOUS | Status: AC
  Administered 2021-10-12: 135 mg via INTRAVENOUS
  Filled 2021-10-12: qty 13.5

## 2021-10-12 NOTE — Patient Instructions (Signed)
[facility]   °Discharge Instructions: °Thank you for choosing [facility] to provide your oncology and hematology care.  ° °If you have a lab appointment with the Cancer Center, please go directly to the Cancer Center and check in at the registration area. °  °Wear comfortable clothing and clothing appropriate for easy access to any Portacath or PICC line.  ° °We strive to give you quality time with your provider. You may need to reschedule your appointment if you arrive late (15 or more minutes).  Arriving late affects you and other patients whose appointments are after yours.  Also, if you miss three or more appointments without notifying the office, you may be dismissed from the clinic at the provider’s discretion.    °  °For prescription refill requests, have your pharmacy contact our office and allow 72 hours for refills to be completed.   ° °Today you received the following chemotherapy and/or immunotherapy agents: Azacitidine (Vidaza)    °  °To help prevent nausea and vomiting after your treatment, we encourage you to take your nausea medication as directed. ° °BELOW ARE SYMPTOMS THAT SHOULD BE REPORTED IMMEDIATELY: °*FEVER GREATER THAN 100.4 F (38 °C) OR HIGHER °*CHILLS OR SWEATING °*NAUSEA AND VOMITING THAT IS NOT CONTROLLED WITH YOUR NAUSEA MEDICATION °*UNUSUAL SHORTNESS OF BREATH °*UNUSUAL BRUISING OR BLEEDING °*URINARY PROBLEMS (pain or burning when urinating, or frequent urination) °*BOWEL PROBLEMS (unusual diarrhea, constipation, pain near the anus) °TENDERNESS IN MOUTH AND THROAT WITH OR WITHOUT PRESENCE OF ULCERS (sore throat, sores in mouth, or a toothache) °UNUSUAL RASH, SWELLING OR PAIN  °UNUSUAL VAGINAL DISCHARGE OR ITCHING  ° °Items with * indicate a potential emergency and should be followed up as soon as possible or go to the Emergency Department if any problems should occur. ° °Please show the CHEMOTHERAPY ALERT CARD or IMMUNOTHERAPY ALERT CARD at  check-in to the Emergency Department and triage nurse. ° °Should you have questions after your visit or need to cancel or reschedule your appointment, please contact [facility]  Dept: [phone]  and follow the prompts.  Office hours are 8:00 a.m. to 4:30 p.m. Monday - Friday. Please note that voicemails left after 4:00 p.m. may not be returned until the following business day.  We are closed weekends and major holidays. You have access to a nurse at all times for urgent questions. Please call the main number to the clinic Dept: [phone] and follow the prompts. ° ° °For any non-urgent questions, you may also contact your provider using MyChart. We now offer e-Visits for anyone 18 and older to request care online for non-urgent symptoms. For details visit [email or web address]. °  °Also download the MyChart app! Go to the app store, search "MyChart", open the app, select [facility], and log in with your MyChart username and password. ° °Due to Covid, a mask is required upon entering the hospital/clinic. If you do not have a mask, one will be given to you upon arrival. For doctor visits, patients may have 1 support person aged 18 or older with them. For treatment visits, patients cannot have anyone with them due to current Covid guidelines and our immunocompromised population.  ° °

## 2021-10-13 ENCOUNTER — Inpatient Hospital Stay: Payer: Medicare HMO

## 2021-10-13 ENCOUNTER — Other Ambulatory Visit: Payer: Self-pay

## 2021-10-13 VITALS — BP 112/60 | HR 85 | Temp 98.5°F | Resp 17

## 2021-10-13 DIAGNOSIS — I10 Essential (primary) hypertension: Secondary | ICD-10-CM | POA: Diagnosis not present

## 2021-10-13 DIAGNOSIS — C9310 Chronic myelomonocytic leukemia not having achieved remission: Secondary | ICD-10-CM

## 2021-10-13 DIAGNOSIS — Z5111 Encounter for antineoplastic chemotherapy: Secondary | ICD-10-CM | POA: Diagnosis not present

## 2021-10-13 DIAGNOSIS — R768 Other specified abnormal immunological findings in serum: Secondary | ICD-10-CM | POA: Diagnosis not present

## 2021-10-13 DIAGNOSIS — Z79899 Other long term (current) drug therapy: Secondary | ICD-10-CM | POA: Diagnosis not present

## 2021-10-13 DIAGNOSIS — D649 Anemia, unspecified: Secondary | ICD-10-CM | POA: Diagnosis not present

## 2021-10-13 DIAGNOSIS — E538 Deficiency of other specified B group vitamins: Secondary | ICD-10-CM | POA: Diagnosis not present

## 2021-10-13 DIAGNOSIS — E1142 Type 2 diabetes mellitus with diabetic polyneuropathy: Secondary | ICD-10-CM | POA: Diagnosis not present

## 2021-10-13 DIAGNOSIS — D693 Immune thrombocytopenic purpura: Secondary | ICD-10-CM | POA: Diagnosis not present

## 2021-10-13 DIAGNOSIS — E1169 Type 2 diabetes mellitus with other specified complication: Secondary | ICD-10-CM | POA: Diagnosis not present

## 2021-10-13 MED ORDER — SODIUM CHLORIDE 0.9% FLUSH
10.0000 mL | INTRAVENOUS | Status: DC | PRN
  Administered 2021-10-13: 10 mL

## 2021-10-13 MED ORDER — PALONOSETRON HCL INJECTION 0.25 MG/5ML
0.2500 mg | Freq: Once | INTRAVENOUS | Status: AC
  Administered 2021-10-13: 0.25 mg via INTRAVENOUS
  Filled 2021-10-13: qty 5

## 2021-10-13 MED ORDER — HEPARIN SOD (PORK) LOCK FLUSH 100 UNIT/ML IV SOLN
500.0000 [IU] | Freq: Once | INTRAVENOUS | Status: AC | PRN
  Administered 2021-10-13: 500 [IU]

## 2021-10-13 MED ORDER — SODIUM CHLORIDE 0.9 % IV SOLN: Freq: Once | INTRAVENOUS | Status: AC

## 2021-10-13 MED ORDER — SODIUM CHLORIDE 0.9 % IV SOLN
10.0000 mg | Freq: Once | INTRAVENOUS | Status: AC
  Administered 2021-10-13: 10 mg via INTRAVENOUS
  Filled 2021-10-13: qty 10

## 2021-10-13 MED ORDER — SODIUM CHLORIDE 0.9 % IV SOLN
75.0000 mg/m2 | Freq: Once | INTRAVENOUS | Status: AC
  Administered 2021-10-13: 135 mg via INTRAVENOUS
  Filled 2021-10-13: qty 13.5

## 2021-10-13 NOTE — Patient Instructions (Signed)
[facility]  Discharge Instructions: Thank you for choosing [facility] to provide your oncology and hematology care.   If you have a lab appointment with the Cancer Center, please go directly to the Cancer Center and check in at the registration area.   Wear comfortable clothing and clothing appropriate for easy access to any Portacath or PICC line.   We strive to give you quality time with your provider. You may need to reschedule your appointment if you arrive late (15 or more minutes).  Arriving late affects you and other patients whose appointments are after yours.  Also, if you miss three or more appointments without notifying the office, you may be dismissed from the clinic at the provider's discretion.      For prescription refill requests, have your pharmacy contact our office and allow 72 hours for refills to be completed.   Today you received the following chemotherapy and/or immunotherapy agents Vidaza     To help prevent nausea and vomiting after your treatment, we encourage you to take your nausea medication as directed.  BELOW ARE SYMPTOMS THAT SHOULD BE REPORTED IMMEDIATELY: *FEVER GREATER THAN 100.4 F (38 C) OR HIGHER *CHILLS OR SWEATING *NAUSEA AND VOMITING THAT IS NOT CONTROLLED WITH YOUR NAUSEA MEDICATION *UNUSUAL SHORTNESS OF BREATH *UNUSUAL BRUISING OR BLEEDING *URINARY PROBLEMS (pain or burning when urinating, or frequent urination) *BOWEL PROBLEMS (unusual diarrhea, constipation, pain near the anus) TENDERNESS IN MOUTH AND THROAT WITH OR WITHOUT PRESENCE OF ULCERS (sore throat, sores in mouth, or a toothache) UNUSUAL RASH, SWELLING OR PAIN  UNUSUAL VAGINAL DISCHARGE OR ITCHING   Items with * indicate a potential emergency and should be followed up as soon as possible or go to the Emergency Department if any problems should occur.  Please show the CHEMOTHERAPY ALERT CARD or IMMUNOTHERAPY ALERT CARD at check-in to the  Emergency Department and triage nurse.  Should you have questions after your visit or need to cancel or reschedule your appointment, please contact [facility]  Dept: [phone]  and follow the prompts.  Office hours are 8:00 a.m. to 4:30 p.m. Monday - Friday. Please note that voicemails left after 4:00 p.m. may not be returned until the following business day.  We are closed weekends and major holidays. You have access to a nurse at all times for urgent questions. Please call the main number to the clinic Dept: [phone] and follow the prompts.   For any non-urgent questions, you may also contact your provider using MyChart. We now offer e-Visits for anyone 18 and older to request care online for non-urgent symptoms. For details visit [email or web address].   Also download the MyChart app! Go to the app store, search "MyChart", open the app, select [facility], and log in with your MyChart username and password.  Due to Covid, a mask is required upon entering the hospital/clinic. If you do not have a mask, one will be given to you upon arrival. For doctor visits, patients may have 1 support person aged 18 or older with them. For treatment visits, patients cannot have anyone with them due to current Covid guidelines and our immunocompromised population.   

## 2021-10-13 NOTE — Progress Notes (Signed)
Per pt, pending dental work. ?

## 2021-10-14 ENCOUNTER — Inpatient Hospital Stay: Payer: Medicare HMO

## 2021-10-14 VITALS — BP 119/63 | HR 81 | Temp 97.9°F | Resp 18

## 2021-10-14 DIAGNOSIS — Z5111 Encounter for antineoplastic chemotherapy: Secondary | ICD-10-CM | POA: Diagnosis not present

## 2021-10-14 DIAGNOSIS — E538 Deficiency of other specified B group vitamins: Secondary | ICD-10-CM | POA: Diagnosis not present

## 2021-10-14 DIAGNOSIS — I10 Essential (primary) hypertension: Secondary | ICD-10-CM | POA: Diagnosis not present

## 2021-10-14 DIAGNOSIS — C9310 Chronic myelomonocytic leukemia not having achieved remission: Secondary | ICD-10-CM

## 2021-10-14 DIAGNOSIS — D693 Immune thrombocytopenic purpura: Secondary | ICD-10-CM | POA: Diagnosis not present

## 2021-10-14 DIAGNOSIS — Z79899 Other long term (current) drug therapy: Secondary | ICD-10-CM | POA: Diagnosis not present

## 2021-10-14 DIAGNOSIS — E1142 Type 2 diabetes mellitus with diabetic polyneuropathy: Secondary | ICD-10-CM | POA: Diagnosis not present

## 2021-10-14 DIAGNOSIS — D649 Anemia, unspecified: Secondary | ICD-10-CM | POA: Diagnosis not present

## 2021-10-14 MED ORDER — SODIUM CHLORIDE 0.9 % IV SOLN
75.0000 mg/m2 | Freq: Once | INTRAVENOUS | Status: AC
  Administered 2021-10-14: 135 mg via INTRAVENOUS
  Filled 2021-10-14: qty 13.5

## 2021-10-14 MED ORDER — SODIUM CHLORIDE 0.9 % IV SOLN
10.0000 mg | Freq: Once | INTRAVENOUS | Status: AC
  Administered 2021-10-14: 10 mg via INTRAVENOUS
  Filled 2021-10-14: qty 10

## 2021-10-14 MED ORDER — SODIUM CHLORIDE 0.9% FLUSH
10.0000 mL | INTRAVENOUS | Status: DC | PRN
  Administered 2021-10-14: 10 mL

## 2021-10-14 MED ORDER — SODIUM CHLORIDE 0.9 % IV SOLN: Freq: Once | INTRAVENOUS | Status: AC

## 2021-10-14 MED ORDER — HEPARIN SOD (PORK) LOCK FLUSH 100 UNIT/ML IV SOLN
500.0000 [IU] | Freq: Once | INTRAVENOUS | Status: AC | PRN
  Administered 2021-10-14: 500 [IU]

## 2021-10-14 NOTE — Patient Instructions (Signed)
[facility]  Discharge Instructions: Thank you for choosing [facility] to provide your oncology and hematology care.   If you have a lab appointment with the Cancer Center, please go directly to the Cancer Center and check in at the registration area.   Wear comfortable clothing and clothing appropriate for easy access to any Portacath or PICC line.   We strive to give you quality time with your provider. You may need to reschedule your appointment if you arrive late (15 or more minutes).  Arriving late affects you and other patients whose appointments are after yours.  Also, if you miss three or more appointments without notifying the office, you may be dismissed from the clinic at the provider's discretion.      For prescription refill requests, have your pharmacy contact our office and allow 72 hours for refills to be completed.   Today you received the following chemotherapy and/or immunotherapy agents Vidaza     To help prevent nausea and vomiting after your treatment, we encourage you to take your nausea medication as directed.  BELOW ARE SYMPTOMS THAT SHOULD BE REPORTED IMMEDIATELY: *FEVER GREATER THAN 100.4 F (38 C) OR HIGHER *CHILLS OR SWEATING *NAUSEA AND VOMITING THAT IS NOT CONTROLLED WITH YOUR NAUSEA MEDICATION *UNUSUAL SHORTNESS OF BREATH *UNUSUAL BRUISING OR BLEEDING *URINARY PROBLEMS (pain or burning when urinating, or frequent urination) *BOWEL PROBLEMS (unusual diarrhea, constipation, pain near the anus) TENDERNESS IN MOUTH AND THROAT WITH OR WITHOUT PRESENCE OF ULCERS (sore throat, sores in mouth, or a toothache) UNUSUAL RASH, SWELLING OR PAIN  UNUSUAL VAGINAL DISCHARGE OR ITCHING   Items with * indicate a potential emergency and should be followed up as soon as possible or go to the Emergency Department if any problems should occur.  Please show the CHEMOTHERAPY ALERT CARD or IMMUNOTHERAPY ALERT CARD at check-in to the  Emergency Department and triage nurse.  Should you have questions after your visit or need to cancel or reschedule your appointment, please contact [facility]  Dept: [phone]  and follow the prompts.  Office hours are 8:00 a.m. to 4:30 p.m. Monday - Friday. Please note that voicemails left after 4:00 p.m. may not be returned until the following business day.  We are closed weekends and major holidays. You have access to a nurse at all times for urgent questions. Please call the main number to the clinic Dept: [phone] and follow the prompts.   For any non-urgent questions, you may also contact your provider using MyChart. We now offer e-Visits for anyone 18 and older to request care online for non-urgent symptoms. For details visit [email or web address].   Also download the MyChart app! Go to the app store, search "MyChart", open the app, select , and log in with your MyChart username and password.  Due to Covid, a mask is required upon entering the hospital/clinic. If you do not have a mask, one will be given to you upon arrival. For doctor visits, patients may have 1 support Kathleen Gibson aged 18 or older with them. For treatment visits, patients cannot have anyone with them due to current Covid guidelines and our immunocompromised population.   

## 2021-10-15 ENCOUNTER — Telehealth: Payer: Self-pay | Admitting: Hematology

## 2021-10-15 ENCOUNTER — Inpatient Hospital Stay: Payer: Medicare HMO

## 2021-10-15 ENCOUNTER — Other Ambulatory Visit: Payer: Self-pay

## 2021-10-15 VITALS — BP 124/63 | HR 82 | Temp 98.6°F | Resp 18

## 2021-10-15 DIAGNOSIS — D693 Immune thrombocytopenic purpura: Secondary | ICD-10-CM | POA: Diagnosis not present

## 2021-10-15 DIAGNOSIS — C9310 Chronic myelomonocytic leukemia not having achieved remission: Secondary | ICD-10-CM

## 2021-10-15 DIAGNOSIS — D649 Anemia, unspecified: Secondary | ICD-10-CM | POA: Diagnosis not present

## 2021-10-15 DIAGNOSIS — I10 Essential (primary) hypertension: Secondary | ICD-10-CM | POA: Diagnosis not present

## 2021-10-15 DIAGNOSIS — E538 Deficiency of other specified B group vitamins: Secondary | ICD-10-CM | POA: Diagnosis not present

## 2021-10-15 DIAGNOSIS — E1142 Type 2 diabetes mellitus with diabetic polyneuropathy: Secondary | ICD-10-CM | POA: Diagnosis not present

## 2021-10-15 DIAGNOSIS — Z5111 Encounter for antineoplastic chemotherapy: Secondary | ICD-10-CM | POA: Diagnosis not present

## 2021-10-15 DIAGNOSIS — Z79899 Other long term (current) drug therapy: Secondary | ICD-10-CM | POA: Diagnosis not present

## 2021-10-15 MED ORDER — SODIUM CHLORIDE 0.9 % IV SOLN
75.0000 mg/m2 | Freq: Once | INTRAVENOUS | Status: AC
  Administered 2021-10-15: 135 mg via INTRAVENOUS
  Filled 2021-10-15: qty 13.5

## 2021-10-15 MED ORDER — SODIUM CHLORIDE 0.9 % IV SOLN: Freq: Once | INTRAVENOUS | Status: AC

## 2021-10-15 MED ORDER — SODIUM CHLORIDE 0.9 % IV SOLN
10.0000 mg | Freq: Once | INTRAVENOUS | Status: AC
  Administered 2021-10-15: 10 mg via INTRAVENOUS
  Filled 2021-10-15: qty 10

## 2021-10-15 MED ORDER — SODIUM CHLORIDE 0.9% FLUSH
10.0000 mL | INTRAVENOUS | Status: DC | PRN
  Administered 2021-10-15: 10 mL

## 2021-10-15 MED ORDER — HEPARIN SOD (PORK) LOCK FLUSH 100 UNIT/ML IV SOLN
500.0000 [IU] | Freq: Once | INTRAVENOUS | Status: AC | PRN
  Administered 2021-10-15: 500 [IU]

## 2021-10-15 MED ORDER — PALONOSETRON HCL INJECTION 0.25 MG/5ML
0.2500 mg | Freq: Once | INTRAVENOUS | Status: AC
  Administered 2021-10-15: 0.25 mg via INTRAVENOUS
  Filled 2021-10-15: qty 5

## 2021-10-15 NOTE — Telephone Encounter (Signed)
Scheduled follow-up appointments per 5/1 los. Patient is aware. ?

## 2021-10-15 NOTE — Patient Instructions (Signed)
[facility]  Discharge Instructions: ?Thank you for choosing [facility] to provide your oncology and hematology care.  ? ?If you have a lab appointment with the Cancer Center, please go directly to the Cancer Center and check in at the registration area. ?  ?Wear comfortable clothing and clothing appropriate for easy access to any Portacath or PICC line.  ? ?We strive to give you quality time with your provider. You may need to reschedule your appointment if you arrive late (15 or more minutes).  Arriving late affects you and other patients whose appointments are after yours.  Also, if you miss three or more appointments without notifying the office, you may be dismissed from the clinic at the provider?s discretion.    ?  ?For prescription refill requests, have your pharmacy contact our office and allow 72 hours for refills to be completed.   ? ?Today you received the following chemotherapy and/or immunotherapy agent: Vidaza    ?  ?To help prevent nausea and vomiting after your treatment, we encourage you to take your nausea medication as directed. ? ?BELOW ARE SYMPTOMS THAT SHOULD BE REPORTED IMMEDIATELY: ?*FEVER GREATER THAN 100.4 F (38 ?C) OR HIGHER ?*CHILLS OR SWEATING ?*NAUSEA AND VOMITING THAT IS NOT CONTROLLED WITH YOUR NAUSEA MEDICATION ?*UNUSUAL SHORTNESS OF BREATH ?*UNUSUAL BRUISING OR BLEEDING ?*URINARY PROBLEMS (pain or burning when urinating, or frequent urination) ?*BOWEL PROBLEMS (unusual diarrhea, constipation, pain near the anus) ?TENDERNESS IN MOUTH AND THROAT WITH OR WITHOUT PRESENCE OF ULCERS (sore throat, sores in mouth, or a toothache) ?UNUSUAL RASH, SWELLING OR PAIN  ?UNUSUAL VAGINAL DISCHARGE OR ITCHING  ? ?Items with * indicate a potential emergency and should be followed up as soon as possible or go to the Emergency Department if any problems should occur. ? ?Please show the CHEMOTHERAPY ALERT CARD or IMMUNOTHERAPY ALERT CARD at check-in to the  Emergency Department and triage nurse. ? ?Should you have questions after your visit or need to cancel or reschedule your appointment, please contact [facility]  Dept: [phone]  and follow the prompts.  Office hours are 8:00 a.m. to 4:30 p.m. Monday - Friday. Please note that voicemails left after 4:00 p.m. may not be returned until the following business day.  We are closed weekends and major holidays. You have access to a nurse at all times for urgent questions. Please call the main number to the clinic Dept: [phone] and follow the prompts. ? ? ?For any non-urgent questions, you may also contact your provider using MyChart. We now offer e-Visits for anyone 18 and older to request care online for non-urgent symptoms. For details visit [email or web address]. ?  ?Also download the MyChart app! Go to the app store, search "MyChart", open the app, select [facility], and log in with your MyChart username and password. ? ?Due to Covid, a mask is required upon entering the hospital/clinic. If you do not have a mask, one will be given to you upon arrival. For doctor visits, patients may have 1 support person aged 18 or older with them. For treatment visits, patients cannot have anyone with them due to current Covid guidelines and our immunocompromised population.  ? ?

## 2021-10-18 ENCOUNTER — Inpatient Hospital Stay: Payer: Medicare HMO

## 2021-10-18 ENCOUNTER — Other Ambulatory Visit: Payer: Self-pay

## 2021-10-18 VITALS — BP 127/68 | HR 92 | Temp 97.9°F | Resp 18

## 2021-10-18 DIAGNOSIS — D693 Immune thrombocytopenic purpura: Secondary | ICD-10-CM | POA: Diagnosis not present

## 2021-10-18 DIAGNOSIS — C9310 Chronic myelomonocytic leukemia not having achieved remission: Secondary | ICD-10-CM | POA: Diagnosis not present

## 2021-10-18 DIAGNOSIS — Z79899 Other long term (current) drug therapy: Secondary | ICD-10-CM | POA: Diagnosis not present

## 2021-10-18 DIAGNOSIS — E1142 Type 2 diabetes mellitus with diabetic polyneuropathy: Secondary | ICD-10-CM | POA: Diagnosis not present

## 2021-10-18 DIAGNOSIS — Z5111 Encounter for antineoplastic chemotherapy: Secondary | ICD-10-CM | POA: Diagnosis not present

## 2021-10-18 DIAGNOSIS — I10 Essential (primary) hypertension: Secondary | ICD-10-CM | POA: Diagnosis not present

## 2021-10-18 DIAGNOSIS — D696 Thrombocytopenia, unspecified: Secondary | ICD-10-CM

## 2021-10-18 DIAGNOSIS — Z95828 Presence of other vascular implants and grafts: Secondary | ICD-10-CM

## 2021-10-18 DIAGNOSIS — D649 Anemia, unspecified: Secondary | ICD-10-CM | POA: Diagnosis not present

## 2021-10-18 DIAGNOSIS — E538 Deficiency of other specified B group vitamins: Secondary | ICD-10-CM | POA: Diagnosis not present

## 2021-10-18 LAB — CBC WITH DIFFERENTIAL (CANCER CENTER ONLY)
Abs Immature Granulocytes: 0.45 10*3/uL — ABNORMAL HIGH (ref 0.00–0.07)
Basophils Absolute: 0.1 10*3/uL (ref 0.0–0.1)
Basophils Relative: 1 %
Eosinophils Absolute: 0.1 10*3/uL (ref 0.0–0.5)
Eosinophils Relative: 1 %
HCT: 30.5 % — ABNORMAL LOW (ref 36.0–46.0)
Hemoglobin: 9.2 g/dL — ABNORMAL LOW (ref 12.0–15.0)
Immature Granulocytes: 5 %
Lymphocytes Relative: 11 %
Lymphs Abs: 0.9 10*3/uL (ref 0.7–4.0)
MCH: 25 pg — ABNORMAL LOW (ref 26.0–34.0)
MCHC: 30.2 g/dL (ref 30.0–36.0)
MCV: 82.9 fL (ref 80.0–100.0)
Monocytes Absolute: 1.2 10*3/uL — ABNORMAL HIGH (ref 0.1–1.0)
Monocytes Relative: 14 %
Neutro Abs: 6 10*3/uL (ref 1.7–7.7)
Neutrophils Relative %: 68 %
Platelet Count: 103 10*3/uL — ABNORMAL LOW (ref 150–400)
RBC: 3.68 MIL/uL — ABNORMAL LOW (ref 3.87–5.11)
RDW: 19.9 % — ABNORMAL HIGH (ref 11.5–15.5)
WBC Count: 8.7 10*3/uL (ref 4.0–10.5)
nRBC: 0 % (ref 0.0–0.2)

## 2021-10-18 LAB — CMP (CANCER CENTER ONLY)
ALT: 17 U/L (ref 0–44)
AST: 12 U/L — ABNORMAL LOW (ref 15–41)
Albumin: 3.3 g/dL — ABNORMAL LOW (ref 3.5–5.0)
Alkaline Phosphatase: 87 U/L (ref 38–126)
Anion gap: 7 (ref 5–15)
BUN: 17 mg/dL (ref 8–23)
CO2: 26 mmol/L (ref 22–32)
Calcium: 8.3 mg/dL — ABNORMAL LOW (ref 8.9–10.3)
Chloride: 107 mmol/L (ref 98–111)
Creatinine: 0.7 mg/dL (ref 0.44–1.00)
GFR, Estimated: >60 mL/min (ref >60)
Glucose, Bld: 187 mg/dL — ABNORMAL HIGH (ref 70–99)
Potassium: 4 mmol/L (ref 3.5–5.1)
Sodium: 140 mmol/L (ref 135–145)
Total Bilirubin: 0.6 mg/dL (ref 0.3–1.2)
Total Protein: 6.5 g/dL (ref 6.5–8.1)

## 2021-10-18 LAB — SAMPLE TO BLOOD BANK

## 2021-10-18 MED ORDER — SODIUM CHLORIDE 0.9% FLUSH
10.0000 mL | Freq: Once | INTRAVENOUS | Status: AC
  Administered 2021-10-18: 10 mL

## 2021-10-18 MED ORDER — HEPARIN SOD (PORK) LOCK FLUSH 100 UNIT/ML IV SOLN
500.0000 [IU] | Freq: Once | INTRAVENOUS | Status: AC
  Administered 2021-10-18: 500 [IU]

## 2021-10-18 MED ORDER — ROMIPLOSTIM INJECTION 500 MCG
590.0000 ug | Freq: Once | SUBCUTANEOUS | Status: AC
  Administered 2021-10-18: 590 ug via SUBCUTANEOUS
  Filled 2021-10-18: qty 1

## 2021-10-18 NOTE — Addendum Note (Signed)
Addended by: Elizebeth Brooking on: 10/18/2021 08:32 AM ? ? Modules accepted: Orders ? ?

## 2021-10-19 ENCOUNTER — Inpatient Hospital Stay: Payer: Medicare HMO

## 2021-10-26 ENCOUNTER — Inpatient Hospital Stay: Payer: Medicare HMO

## 2021-11-01 DIAGNOSIS — C9310 Chronic myelomonocytic leukemia not having achieved remission: Secondary | ICD-10-CM | POA: Diagnosis not present

## 2021-11-01 DIAGNOSIS — D693 Immune thrombocytopenic purpura: Secondary | ICD-10-CM | POA: Diagnosis not present

## 2021-11-02 ENCOUNTER — Inpatient Hospital Stay: Payer: Medicare HMO

## 2021-11-04 DIAGNOSIS — Z79899 Other long term (current) drug therapy: Secondary | ICD-10-CM | POA: Diagnosis not present

## 2021-11-04 DIAGNOSIS — C9310 Chronic myelomonocytic leukemia not having achieved remission: Secondary | ICD-10-CM | POA: Diagnosis not present

## 2021-11-04 DIAGNOSIS — E611 Iron deficiency: Secondary | ICD-10-CM | POA: Diagnosis not present

## 2021-11-04 DIAGNOSIS — M549 Dorsalgia, unspecified: Secondary | ICD-10-CM | POA: Diagnosis not present

## 2021-11-04 DIAGNOSIS — R69 Illness, unspecified: Secondary | ICD-10-CM | POA: Diagnosis not present

## 2021-11-04 DIAGNOSIS — Z79891 Long term (current) use of opiate analgesic: Secondary | ICD-10-CM | POA: Diagnosis not present

## 2021-11-05 ENCOUNTER — Other Ambulatory Visit: Payer: Self-pay | Admitting: Hematology

## 2021-11-05 ENCOUNTER — Other Ambulatory Visit: Payer: Self-pay | Admitting: Family Medicine

## 2021-11-05 MED FILL — Dexamethasone Sodium Phosphate Inj 100 MG/10ML: INTRAMUSCULAR | Qty: 1 | Status: AC

## 2021-11-08 ENCOUNTER — Other Ambulatory Visit: Payer: Self-pay | Admitting: Physician Assistant

## 2021-11-08 DIAGNOSIS — C9310 Chronic myelomonocytic leukemia not having achieved remission: Secondary | ICD-10-CM

## 2021-11-09 ENCOUNTER — Other Ambulatory Visit: Payer: Self-pay

## 2021-11-09 ENCOUNTER — Inpatient Hospital Stay: Payer: Medicare HMO

## 2021-11-09 ENCOUNTER — Inpatient Hospital Stay (HOSPITAL_BASED_OUTPATIENT_CLINIC_OR_DEPARTMENT_OTHER): Payer: Medicare HMO | Admitting: Physician Assistant

## 2021-11-09 VITALS — HR 97 | Ht 63.0 in

## 2021-11-09 VITALS — BP 114/70 | HR 109 | Temp 97.9°F | Resp 18 | Wt 142.4 lb

## 2021-11-09 DIAGNOSIS — E538 Deficiency of other specified B group vitamins: Secondary | ICD-10-CM | POA: Diagnosis not present

## 2021-11-09 DIAGNOSIS — D693 Immune thrombocytopenic purpura: Secondary | ICD-10-CM | POA: Diagnosis not present

## 2021-11-09 DIAGNOSIS — Z5111 Encounter for antineoplastic chemotherapy: Secondary | ICD-10-CM | POA: Diagnosis not present

## 2021-11-09 DIAGNOSIS — D696 Thrombocytopenia, unspecified: Secondary | ICD-10-CM

## 2021-11-09 DIAGNOSIS — C9310 Chronic myelomonocytic leukemia not having achieved remission: Secondary | ICD-10-CM

## 2021-11-09 DIAGNOSIS — Z95828 Presence of other vascular implants and grafts: Secondary | ICD-10-CM

## 2021-11-09 DIAGNOSIS — E1142 Type 2 diabetes mellitus with diabetic polyneuropathy: Secondary | ICD-10-CM | POA: Diagnosis not present

## 2021-11-09 DIAGNOSIS — I10 Essential (primary) hypertension: Secondary | ICD-10-CM | POA: Diagnosis not present

## 2021-11-09 DIAGNOSIS — Z79899 Other long term (current) drug therapy: Secondary | ICD-10-CM | POA: Diagnosis not present

## 2021-11-09 DIAGNOSIS — D649 Anemia, unspecified: Secondary | ICD-10-CM | POA: Diagnosis not present

## 2021-11-09 LAB — CBC WITH DIFFERENTIAL (CANCER CENTER ONLY)
Abs Immature Granulocytes: 0.1 10*3/uL — ABNORMAL HIGH (ref 0.00–0.07)
Basophils Absolute: 0.1 10*3/uL (ref 0.0–0.1)
Basophils Relative: 1 %
Eosinophils Absolute: 0 10*3/uL (ref 0.0–0.5)
Eosinophils Relative: 0 %
HCT: 27.7 % — ABNORMAL LOW (ref 36.0–46.0)
Hemoglobin: 9 g/dL — ABNORMAL LOW (ref 12.0–15.0)
Immature Granulocytes: 2 %
Lymphocytes Relative: 11 %
Lymphs Abs: 0.7 10*3/uL (ref 0.7–4.0)
MCH: 26.7 pg (ref 26.0–34.0)
MCHC: 32.5 g/dL (ref 30.0–36.0)
MCV: 82.2 fL (ref 80.0–100.0)
Monocytes Absolute: 1.2 10*3/uL — ABNORMAL HIGH (ref 0.1–1.0)
Monocytes Relative: 19 %
Neutro Abs: 4.3 10*3/uL (ref 1.7–7.7)
Neutrophils Relative %: 67 %
Platelet Count: 103 10*3/uL — ABNORMAL LOW (ref 150–400)
RBC: 3.37 MIL/uL — ABNORMAL LOW (ref 3.87–5.11)
RDW: 20.6 % — ABNORMAL HIGH (ref 11.5–15.5)
WBC Count: 6.4 10*3/uL (ref 4.0–10.5)
nRBC: 0 % (ref 0.0–0.2)

## 2021-11-09 LAB — CMP (CANCER CENTER ONLY)
ALT: 16 U/L (ref 0–44)
AST: 16 U/L (ref 15–41)
Albumin: 3.2 g/dL — ABNORMAL LOW (ref 3.5–5.0)
Alkaline Phosphatase: 94 U/L (ref 38–126)
Anion gap: 4 — ABNORMAL LOW (ref 5–15)
BUN: 13 mg/dL (ref 8–23)
CO2: 27 mmol/L (ref 22–32)
Calcium: 8.4 mg/dL — ABNORMAL LOW (ref 8.9–10.3)
Chloride: 108 mmol/L (ref 98–111)
Creatinine: 0.61 mg/dL (ref 0.44–1.00)
GFR, Estimated: >60 mL/min (ref >60)
Glucose, Bld: 177 mg/dL — ABNORMAL HIGH (ref 70–99)
Potassium: 3.9 mmol/L (ref 3.5–5.1)
Sodium: 139 mmol/L (ref 135–145)
Total Bilirubin: 0.7 mg/dL (ref 0.3–1.2)
Total Protein: 6.1 g/dL — ABNORMAL LOW (ref 6.5–8.1)

## 2021-11-09 LAB — SAMPLE TO BLOOD BANK

## 2021-11-09 MED ORDER — HEPARIN SOD (PORK) LOCK FLUSH 100 UNIT/ML IV SOLN
500.0000 [IU] | Freq: Once | INTRAVENOUS | Status: AC | PRN
  Administered 2021-11-09: 500 [IU]

## 2021-11-09 MED ORDER — SODIUM CHLORIDE 0.9 % IV SOLN
10.0000 mg | Freq: Once | INTRAVENOUS | Status: AC
  Administered 2021-11-09: 10 mg via INTRAVENOUS
  Filled 2021-11-09: qty 10

## 2021-11-09 MED ORDER — SODIUM CHLORIDE 0.9% FLUSH
10.0000 mL | Freq: Once | INTRAVENOUS | Status: AC
  Administered 2021-11-09: 10 mL

## 2021-11-09 MED ORDER — PALONOSETRON HCL INJECTION 0.25 MG/5ML
0.2500 mg | Freq: Once | INTRAVENOUS | Status: AC
  Administered 2021-11-09: 0.25 mg via INTRAVENOUS
  Filled 2021-11-09: qty 5

## 2021-11-09 MED ORDER — SODIUM CHLORIDE 0.9 % IV SOLN: Freq: Once | INTRAVENOUS | Status: AC

## 2021-11-09 MED ORDER — SODIUM CHLORIDE 0.9 % IV SOLN
75.0000 mg/m2 | Freq: Once | INTRAVENOUS | Status: AC
  Administered 2021-11-09: 135 mg via INTRAVENOUS
  Filled 2021-11-09: qty 13.5

## 2021-11-09 MED ORDER — ROMIPLOSTIM INJECTION 500 MCG
590.0000 ug | Freq: Once | SUBCUTANEOUS | Status: AC
  Administered 2021-11-09: 590 ug via SUBCUTANEOUS
  Filled 2021-11-09: qty 1

## 2021-11-09 MED ORDER — CYANOCOBALAMIN 1000 MCG/ML IJ SOLN
1000.0000 ug | Freq: Once | INTRAMUSCULAR | Status: AC
  Administered 2021-11-09: 1000 ug via INTRAMUSCULAR
  Filled 2021-11-09: qty 1

## 2021-11-09 MED ORDER — SODIUM CHLORIDE 0.9% FLUSH
10.0000 mL | INTRAVENOUS | Status: DC | PRN
  Administered 2021-11-09: 10 mL

## 2021-11-09 MED FILL — Dexamethasone Sodium Phosphate Inj 100 MG/10ML: INTRAMUSCULAR | Qty: 1 | Status: AC

## 2021-11-09 NOTE — Patient Instructions (Signed)
[facility]  Discharge Instructions: Thank you for choosing [facility] to provide your oncology and hematology care.   If you have a lab appointment with the [facility], please go directly to the [facility] and check in at the registration area.   Wear comfortable clothing and clothing appropriate for easy access to any Portacath or PICC line.   We strive to give you quality time with your provider. You may need to reschedule your appointment if you arrive late (15 or more minutes).  Arriving late affects you and other patients whose appointments are after yours.  Also, if you miss three or more appointments without notifying the office, you may be dismissed from the clinic at the provider's discretion.      For prescription refill requests, have your pharmacy contact our office and allow 72 hours for refills to be completed.  Today you received the following chemotherapy and/or immunotherapy agent: Azacitidine (Vidaza)    To help prevent nausea and vomiting after your treatment, we encourage you to take your nausea medication as directed.  BELOW ARE SYMPTOMS THAT SHOULD BE REPORTED IMMEDIATELY: *FEVER GREATER THAN 100.4 F (38 C) OR HIGHER *CHILLS OR SWEATING *NAUSEA AND VOMITING THAT IS NOT CONTROLLED WITH YOUR NAUSEA MEDICATION *UNUSUAL SHORTNESS OF BREATH *UNUSUAL BRUISING OR BLEEDING *URINARY PROBLEMS (pain or burning when urinating, or frequent urination) *BOWEL PROBLEMS (unusual diarrhea, constipation, pain near the anus) TENDERNESS IN MOUTH AND THROAT WITH OR WITHOUT PRESENCE OF ULCERS (sore throat, sores in mouth, or a toothache) UNUSUAL RASH, SWELLING OR PAIN  UNUSUAL VAGINAL DISCHARGE OR ITCHING   Items with * indicate a potential emergency and should be followed up as soon as possible or go to the Emergency Department if any problems should occur.  Please show the CHEMOTHERAPY ALERT CARD or IMMUNOTHERAPY ALERT CARD at  check-in to the Emergency Department and triage nurse.  Should you have questions after your visit or need to cancel or reschedule your appointment, please contact [facility]  Dept: [phone]  and follow the prompts.  Office hours are 8:00 a.m. to 4:30 p.m. Monday - Friday. Please note that voicemails left after 4:00 p.m. may not be returned until the following business day.  We are closed weekends and major holidays. You have access to a nurse at all times for urgent questions. Please call the main number to the clinic Dept: [phone] and follow the prompts.   For any non-urgent questions, you may also contact your provider using MyChart. We now offer e-Visits for anyone 47 and older to request care online for non-urgent symptoms. For details visit [email or web address].   Also download the MyChart app! Go to the app store, search "MyChart", open the app, select [facility], and log in with your MyChart username and password.  Due to Covid, a mask is required upon entering the hospital/clinic. If you do not have a mask, one will be given to you upon arrival. For doctor visits, patients may have 1 support person aged 53 or older with them. For treatment visits, patients cannot have anyone with them due to current Covid guidelines and our immunocompromised population.   Vitamin B12 Injection What is this medication? Vitamin B12 (VAHY tuh min B12) prevents and treats low vitamin B12 levels in your body. It is used in people who do not get enough vitamin B12 from their diet or when their digestive tract does not absorb enough. Vitamin B12 plays an important  role in maintaining the health of your nervous system and red blood cells. This medicine may be used for other purposes; ask your health care provider or pharmacist if you have questions. COMMON BRAND NAME(S): B-12 Compliance Kit, B-12 Injection Kit, Cyomin, Dodex, LA-12, Nutri-Twelve, Physicians EZ Use B-12,  Primabalt What should I tell my care team before I take this medication? They need to know if you have any of these conditions: Kidney disease Leber's disease Megaloblastic anemia An unusual or allergic reaction to cyanocobalamin, cobalt, other medications, foods, dyes, or preservatives Pregnant or trying to get pregnant Breast-feeding How should I use this medication? This medication is injected into a muscle or deeply under the skin. It is usually given in a clinic or care team's office. However, your care team may teach you how to inject yourself. Follow all instructions. Talk to your care team about the use of this medication in children. Special care may be needed. Overdosage: If you think you have taken too much of this medicine contact a poison control center or emergency room at once. NOTE: This medicine is only for you. Do not share this medicine with others. What if I miss a dose? If you are given your dose at a clinic or care team's office, call to reschedule your appointment. If you give your own injections, and you miss a dose, take it as soon as you can. If it is almost time for your next dose, take only that dose. Do not take double or extra doses. What may interact with this medication? Alcohol Colchicine This list may not describe all possible interactions. Give your health care provider a list of all the medicines, herbs, non-prescription drugs, or dietary supplements you use. Also tell them if you smoke, drink alcohol, or use illegal drugs. Some items may interact with your medicine. What should I watch for while using this medication? Visit your care team regularly. You may need blood work done while you are taking this medication. You may need to follow a special diet. Talk to your care team. Limit your alcohol intake and avoid smoking to get the best benefit. What side effects may I notice from receiving this medication? Side effects that you should report to your care  team as soon as possible: Allergic reactions--skin rash, itching, hives, swelling of the face, lips, tongue, or throat Swelling of the ankles, hands, or feet Trouble breathing Side effects that usually do not require medical attention (report to your care team if they continue or are bothersome): Diarrhea This list may not describe all possible side effects. Call your doctor for medical advice about side effects. You may report side effects to FDA at 1-800-FDA-1088. Where should I keep my medication? Keep out of the reach of children. Store at room temperature between 15 and 30 degrees C (59 and 85 degrees F). Protect from light. Throw away any unused medication after the expiration date. NOTE: This sheet is a summary. It may not cover all possible information. If you have questions about this medicine, talk to your doctor, pharmacist, or health care provider.  2023 Elsevier/Gold Standard (2021-02-09 00:00:00)  Romiplostim injection What is this medication? ROMIPLOSTIM (roe mi PLOE stim) helps your body make more platelets. This medicine is used to treat low platelets caused by chronic idiopathic thrombocytopenic purpura (ITP) or a bone marrow syndrome caused by radiation sickness. This medicine may be used for other purposes; ask your health care provider or pharmacist if you have questions. COMMON BRAND NAME(S): Nplate  What should I tell my care team before I take this medication? They need to know if you have any of these conditions: blood clots myelodysplastic syndrome an unusual or allergic reaction to romiplostim, mannitol, other medicines, foods, dyes, or preservatives pregnant or trying to get pregnant breast-feeding How should I use this medication? This medicine is injected under the skin. It is given by a health care provider in a hospital or clinic setting. A special MedGuide will be given to you before each treatment. Be sure to read this information carefully each  time. Talk to your health care provider about the use of this medicine in children. While it may be prescribed for children as young as newborns for selected conditions, precautions do apply. Overdosage: If you think you have taken too much of this medicine contact a poison control center or emergency room at once. NOTE: This medicine is only for you. Do not share this medicine with others. What if I miss a dose? Keep appointments for follow-up doses. It is important not to miss your dose. Call your health care provider if you are unable to keep an appointment. What may interact with this medication? Interactions are not expected. This list may not describe all possible interactions. Give your health care provider a list of all the medicines, herbs, non-prescription drugs, or dietary supplements you use. Also tell them if you smoke, drink alcohol, or use illegal drugs. Some items may interact with your medicine. What should I watch for while using this medication? Visit your health care provider for regular checks on your progress. You may need blood work done while you are taking this medicine. Your condition will be monitored carefully while you are receiving this medicine. It is important not to miss any appointments. What side effects may I notice from receiving this medication? Side effects that you should report to your doctor or health care professional as soon as possible: allergic reactions (skin rash, itching or hives; swelling of the face, lips, or tongue) bleeding (bloody or black, tarry stools; red or dark brown urine; spitting up blood or brown material that looks like coffee grounds; red spots on the skin; unusual bruising or bleeding from the eyes, gums, or nose) blood clot (chest pain; shortness of breath; pain, swelling, or warmth in the leg) stroke (changes in vision; confusion; trouble speaking or understanding; severe headaches; sudden numbness or weakness of the face, arm or  leg; trouble walking; dizziness; loss of balance or coordination) Side effects that usually do not require medical attention (report to your doctor or health care professional if they continue or are bothersome): diarrhea dizziness headache joint pain muscle pain stomach pain trouble sleeping This list may not describe all possible side effects. Call your doctor for medical advice about side effects. You may report side effects to FDA at 1-800-FDA-1088. Where should I keep my medication? This medicine is given in a hospital or clinic. It will not be stored at home. NOTE: This sheet is a summary. It may not cover all possible information. If you have questions about this medicine, talk to your doctor, pharmacist, or health care provider.  2023 Elsevier/Gold Standard (2021-04-30 00:00:00)

## 2021-11-09 NOTE — Progress Notes (Signed)
[facility]   [phone] [phone]   Clinic Follow up Note   Patient Care Team: Natalia Leatherwood, DO as PCP - General (Family Medicine) Myrtie Neither Andreas Blower, MD as Consulting Physician (Gastroenterology) Malachy Mood, MD as Consulting Physician (Hematology) Glendale Chard, DO as Consulting Physician (Neurology) 11/09/2021  CHIEF COMPLAINT: Follow up [name]  SUMMARY OF ONCOLOGIC HISTORY: Oncology History  [name] (chronic myelomonocytic leukemia) (HCC)  10/29/2020 Imaging   CT CAP  IMPRESSION: 1. Splenomegaly with pathologically enlarged lymph nodes above and below the diaphragm, with overall stable to minimally increased abdominal adenopathy and interval progression of the pelvic adenopathy. 2. Small volume abdominopelvic ascites with diffuse mesenteric stranding. 3. Scattered bilateral pulmonary micro nodules measuring 1-2 mm. 4. Distended gallbladder with some layering hyperdense material representing layering sludge and tiny stones seen on prior ultrasound. 5. Aortic atherosclerosis.   10/30/2020 Pathology Results   DIAGNOSIS:   BONE MARROW, ASPIRATE, CLOT, CORE:  -  Hypercellular bone marrow with panhyperplasia, atypia, and no  increase in blasts  -  See comment   PERIPHERAL BLOOD:  -  Marked thrombocytopenia  -  Absolute monocytosis  -  Normocytic anemia  -  See CBC data and comment   COMMENT:  The bone marrow is hypercellular for age (approximately 80%) with myeloid hyperplasia with maturational left shift, erythroid hyperplasia, and increased megakaryocytes.  Mild multilineage atypia is present. Blasts are not increased on aspirate smears (1% by manual differential count) or by CD34 immunohistochemistry on the core biopsy.  Concurrent flow cytometric analysis of the bone marrow aspirate demonstrates increased monocytes, and no increase in blasts or abnormal lymphoid population (see [id number]).  Monocytes are also increased in peripheral   blood, persistent per electronic medical record.  In aggregate, the  findings raise the possibility of a myeloid neoplasm with the  differential diagnosis including a low-grade myelodysplastic syndrome and chronic myelomonocytic leukemia (dysplastic type).    ADDENDUM:  A reticulin special stain performed on the bone marrow core biopsy reveals no significant increase in reticulin fibrosis.  ADDENDUM:  CYTOGENETIC RESULTS:  Karyotype: 46,XX[20]  Interpretation: NORMAL FEMALE KARYOTYPE   FISH RESULTS:  Results: NORMAL   ADDENDUM:  CD123 immunohistochemistry performed on the core biopsy highlights scattered aggregates of positively staining cells consistent with plasmacytoid dendritic cells.    10/30/2020 Pathology Results   DIAGNOSIS:   BONE MARROW; FLOW CYTOMETRIC ANALYSIS:  -  Increased monocytes  -  Scant B-cells present  -  No immunophenotypically aberrant T-cell population identified  -  No increase in blasts  -  See comment   COMMENT:  Monocytes are relatively increased (12% of all cells), without aberrant expression of CD56.  B-cells comprise <1% of total lymphocytes. CD34-positive blasts are not increased (<1% of all cells).  Correlation with concurrent morphology is recommended for complete diagnostic interpretation and overall blast enumeration (see [id number]).    10/30/2020 Pathology Results   FINAL MICROSCOPIC DIAGNOSIS:   A. LYMPH NODE, RIGHT AXILLARY, NEEDLE CORE BIOPSY:  -Lymphoid tissue present  -See comment   COMMENT:  The sections show several small needle core biopsy fragments of lymphoid tissue displaying degenerative cellular changes/necrosis and hence cannot be accurately evaluated.  Sample for flow cytometric analysis not available.  Immunohistochemical stain for CD3 and CD20 were performed with appropriate controls.  There is a mixture of T and B cells in their apparently respective compartments.  There is no definite metastatic malignancy.    11/11/2020  Pathology Results  DIAGNOSIS:   LEFT AXILLARY LYMPH NODE EXCISIONAL BIOPSY; FLOW CYTOMETRIC ANALYSIS:  -  No monotypic B-cell or immunophenotypically aberrant T-cell  population identified  -  See comment   COMMENT:  Flow cytometric analysis identifies B-cells with a normal kappa:lambda ratio of 1.7:1.  A subset of the polytypic B-cells expresses CD10. T-cells show a CD4:CD8 ratio of 3.3:1 without immunophenotypic aberrancy with the markers evaluated.  Although these results do not support the diagnosis of a clonal lymphoid population, sampling issues must always be considered when negative results are obtained, as focal lesions may not be represented in the specimen submitted.  In addition, flow cytometric immunophenotyping will not exclude other pathology if present (e.g. Hodgkin lymphoma, some T-cell lymphomas, metastatic and infectious diseases).   11/11/2020 Pathology Results   FINAL MICROSCOPIC DIAGNOSIS:   A. LYMPH NODE, LEFT AXILLARY, DISSECTION:  -  Follicular hyperplasia with interfollicular expansion  -  See comment   COMMENT:  Sections of the lymph nodes reveal generally preserved lymph node architecture.  There is follicular hyperplasia with some follicles showing increased hyalinization of germinal centers and mild concentric encircling of germinal centers with small lymphocytes (onion skinning). Interfollicular areas are expanded with increased vascular proliferation, small lymphocytes, plasma cells, histiocytic cells, and patchy neutrophils.  The lymph node capsule is variably thickened by fibrosis with few plasma cells.   A panel of immunohistochemical stains is performed for further  characterization.  CD3 and CD20/PAX5 highlight T-cell and B-cell  compartments, respectively.  Germinal centers are highlighted by CD10 and BCL6 with appropriate absent expression of BCL2.  CD5 is similar to CD3.  CD43 also stains the T-cells.  CD4-positive T-cells exceed CD8-positive T-cells.   CD30 highlights scattered immunoblasts.  CD21 reveals intact follicular dendritic cell meshworks.  CD68 highlights increased histiocytic cells.  HHV 8 is negative.  T. pallidum reveals no definitive organisms.  Pancytokeratin is negative.  TdT stains rare cells.  CD138 highlights plasma cells which are not increased in number and show polytypic light chain expression by kappa/lambda in situ  hybridization.  EBV is negative by in situ hybridization.   Concurrent flow cytometric analysis is negative for a monoclonal B-cell or immunophenotypically aberrant T-cell population (see [phone]).   Together, the findings above are non-specific and can be seen in  reactive and infectious processes as well as Castleman disease.  There is no definitive morphologic or flow cytometric evidence of involvement by a lymphoproliferative disorder from the current workup.   12/17/2020 Initial Diagnosis   [name] (chronic myelomonocytic leukemia) ([place])    12/28/2020 -  Chemotherapy   Patient is on Treatment Plan : MYELODYSPLASIA  Azacitidine IV D1-5 q28d      03/18/2021 Pathology Results   Final Diagnosis    BONE MARROW:             Persistent chronic myelomonocytic leukemia in a hypercellular bone marrow with increased monocytes and no increase in blasts.   Comment    Flow cytometric analysis showed no increase in blasts and 17% monocytes.A next generation myeloid panel showed the presence of the following mutations: NRAS, SH2B3, PHF6 and TET2. CD34 and CD117 immunstains do not show an increase in blasts. The findings are consistent with persistent [name] with a decrease in the number of blasts compared to that seen on the previous bone marrow.    Final Interpretation      BONE MARROW; FLOW CYTOMETRIC ANALYSIS:   No increased blasts identified. (see comment)  Monocytosis (17% of total events).  COMMENT: Flow cytometry identified about 0.6% of total events as immature cells. These cells express CD45 (dim), CD34,  CD117, HLA-DR, CD38, CD33 (variable). This immunophenotype is consistent with normal myeloblasts. In addition, monocytes are increased and comprise of approximately 17% of total events. These monocytes show normal expression of CD33/CD64/CD14/CD11b with variable expression of CD4. Correlation with concurrent morphology and clinical data is recommended (see [id number]).    FLOW CYTOMETRY ANALYSIS: CD45 versus side scatter analysis demonstrate a predominance of granulocytes (~70%), monocytes (~17%) and lymphocytes (~6%). No significant increase in blasts identified. All tested markers were used for adequate analysis of the cells and were appropriately reviewed.       CURRENT THERAPY:  Azacitidine days 1-5 q28 days, started 12/28/20 Nplate weekly, dose adjust for plt count  Platelet transfusion as needed (plt<15K), blood transfusion if Hg<8.0   INTERVAL HISTORY: Kathleen Gibson returns for follow up and treatment as scheduled. Last seen by Cira Rue, NP on 10/11/2021 with last cycle of Azacitadine.  She reports that she has felt more fatigue over the last few days but contributes this to the bad weather. She usually feels more active when she can go outside but the weather has prevented this. She appetite is stable and she has gained 3 lbs since the last visit. She denies nausea, vomiting or abdominal pain. Her bowel habits are unchanged without any recurrent episodes of constipation or diarrhea. She denies easy bruising or signs of active bleeding. She has neuropathy that is mild and intermittent in the fingertips which has improved on Gabapentin.  She denies fevers, chills, night sweats, shortness of breath, chest pain or cough. She has no other complaints.   All other systems were reviewed with the patient and are negative.  MEDICAL HISTORY:  Past Medical History:  Diagnosis Date   Diabetes ([place])    HLD (hyperlipidemia)    Hypertension    Pernicious anemia    Pneumonia 09/05/2019   Renal  insufficiency 09/06/2019   Vitamin D deficiency     SURGICAL HISTORY: Past Surgical History:  Procedure Laterality Date   ABDOMINAL HERNIA REPAIR  2009   ABDOMINOPLASTY     AXILLARY LYMPH NODE BIOPSY Left 11/11/2020   Procedure: LEFT AXILLARY EXCISIONAL LYMPH NODE BIOPSY;  Surgeon: [name], MD;  Location: [facility];  Service: General;  Laterality: Left;   [place]   GASTRIC BYPASS  2000   hemorroid surgery  2007   IR IMAGING GUIDED PORT INSERTION  07/26/2021   IR KYPHO EA ADDL LEVEL THORACIC OR LUMBAR  05/31/2021   IR KYPHO THORACIC WITH BONE BIOPSY  05/31/2021   TONSILLECTOMY AND ADENOIDECTOMY  1957    I have reviewed the social history and family history with the patient and they are unchanged from previous note.  ALLERGIES:  is allergic to asa [aspirin] and nsaids.  MEDICATIONS:  Current Outpatient Medications  Medication Sig Dispense Refill   acetaminophen (TYLENOL) 500 MG tablet Take 2 tablets (1,000 mg total) by mouth every 8 (eight) hours. 30 tablet 0   ammonium lactate (LAC-HYDRIN) 12 % lotion Apply 1 application topically 2 (two) times daily as needed for dry skin.     bisacodyl (DULCOLAX) 10 MG suppository Place 1 suppository (10 mg total) rectally daily as needed for moderate constipation (Please take-if no bowel movement in 1-2 days with MiraLAX/senna). 12 suppository 0   cyanocobalamin (,VITAMIN B-12,) 1000 MCG/ML injection Inject 1,000 mcg into the muscle every 30 (thirty) days.     gabapentin (NEURONTIN)  300 MG capsule Take 2 capsules (600 mg total) by mouth 2 (two) times daily. 120 capsule 0   glucose blood (ONETOUCH VERIO) test strip Monitor blood sugars twice daily 100 each 5   Lancets (ONETOUCH ULTRASOFT) lancets Monitor blood sugars twice daily 100 each 5   lidocaine-prilocaine (EMLA) cream APPLY 2 GRAMS TO PORT-A-CATH SITE 30-60 MINUTES PRIOR TO PORT ACCESS AS NEEDED 30 g 1   LORazepam (ATIVAN) 1 MG tablet TAKE 1 TABLET BY MOUTH EVERY 8 HOURS AS  NEEDED FOR ANXIETY 30 tablet 0   metoprolol succinate (TOPROL-XL) 25 MG 24 hr tablet Take 1 tablet (25 mg total) by mouth daily. 30 tablet 0   naloxone (NARCAN) nasal spray 4 mg/0.1 mL Place 1 spray into the nose once as needed (overdose). 2 each 0   ondansetron (ZOFRAN-ODT) 4 MG disintegrating tablet Take 4 mg by mouth every 8 (eight) hours as needed for nausea or vomiting.     Oxycodone HCl 10 MG TABS 1 tab every 4-6 hr prn for chronic pain (Patient taking differently: Take 10 mg by mouth every 4 (four) hours as needed (chronic pain). 1 tab every 4-6 hr prn for chronic pain) 120 tablet 0   polyethylene glycol powder (GLYCOLAX/MIRALAX) 17 GM/SCOOP powder Dissolve 1 capful (17 g) in water and drink 2 (two) times daily. (Patient taking differently: Take 17 g by mouth daily.) 510 g 2   senna-docusate (SENOKOT-S) 8.6-50 MG tablet Take 2 tablets by mouth at bedtime. (Patient taking differently: Take 2 tablets by mouth at bedtime as needed for mild constipation.) 60 tablet 2   sitaGLIPtin-metformin (JANUMET) 50-1000 MG tablet Take 1 tablet by mouth at bedtime. 90 tablet 1   No current facility-administered medications for this visit.   Facility-Administered Medications Ordered in Other Visits  Medication Dose Route Frequency Provider Last Rate Last Admin   0.9 %  sodium chloride infusion (Manually program via Guardrails IV Fluids)  250 mL Intravenous Once Truitt Merle, MD       azaCITIDine (VIDAZA) 135 mg in sodium chloride 0.9 % 50 mL chemo infusion  75 mg/m2 (Treatment Plan Recorded) Intravenous Once Truitt Merle, MD       cyanocobalamin ((VITAMIN B-12)) injection 1,000 mcg  1,000 mcg Intramuscular Once Truitt Merle, MD       dexamethasone (DECADRON) 10 mg in sodium chloride 0.9 % 50 mL IVPB  10 mg Intravenous Once Truitt Merle, MD       heparin lock flush 100 unit/mL  500 Units Intracatheter Once PRN Truitt Merle, MD       romiPLOStim (NPLATE) injection 590 mcg  590 mcg Subcutaneous Once Truitt Merle, MD       sodium  chloride flush (NS) 0.9 % injection 10 mL  10 mL Intracatheter PRN Truitt Merle, MD        PHYSICAL EXAMINATION: ECOG PERFORMANCE STATUS: 1 - Symptomatic but completely ambulatory  Vitals:   11/09/21 0840  BP: 114/70  Pulse: (!) 109  Resp: 18  Temp: 97.9 F (36.6 C)  SpO2: 100%   Filed Weights   11/09/21 0840  Weight: 142 lb 6.4 oz (64.6 kg)    GENERAL:alert, no distress and comfortable SKIN: No rash  EYES: sclera clear LUNGS: clear with normal breathing effort HEART: no lower extremity edema NEURO: alert & oriented x 3 with fluent speech, no focal motor/sensory deficits.  Intact peripheral vibratory sense over the fingertips per tuning fork exam PAC without erythema  LABORATORY DATA:  I have reviewed the data as  listed    Latest Ref Rng & Units 11/09/2021    8:10 AM 10/18/2021    7:51 AM 10/11/2021   10:03 AM  CBC  WBC 4.0 - 10.5 K/uL 6.4   8.7   5.9    Hemoglobin 12.0 - 15.0 g/dL 9.0   9.2   9.7    Hematocrit 36.0 - 46.0 % 27.7   30.5   31.2    Platelets 150 - 400 K/uL 103   103   130          Latest Ref Rng & Units 11/09/2021    8:10 AM 10/18/2021    7:51 AM 10/11/2021   10:03 AM  CMP  Glucose 70 - 99 mg/dL 177   187   221    BUN 8 - 23 mg/dL $Remove'13   17   13    'cKFEWRa$ Creatinine 0.44 - 1.00 mg/dL 0.61   0.70   0.66    Sodium 135 - 145 mmol/L 139   140   137    Potassium 3.5 - 5.1 mmol/L 3.9   4.0   4.1    Chloride 98 - 111 mmol/L 108   107   105    CO2 22 - 32 mmol/L $RemoveB'27   26   28    'UAvRYrOK$ Calcium 8.9 - 10.3 mg/dL 8.4   8.3   8.7    Total Protein 6.5 - 8.1 g/dL 6.1   6.5   7.0    Total Bilirubin 0.3 - 1.2 mg/dL 0.7   0.6   0.8    Alkaline Phos 38 - 126 U/L 94   87   109    AST 15 - 41 U/L $Remo'16   12   14    'Cynnx$ ALT 0 - 44 U/L $Remo'16   17   16        'zKEjU$ RADIOGRAPHIC STUDIES: I have personally reviewed the radiological images as listed and agreed with the findings in the report. No results found.   ASSESSMENT & PLAN: Kathleen Gibson is a 71 y.o. female with   1. [name] with severe  thrombocytopenia  -Initially diagnosed with ITP in 2014. She lost follow up after 11/2017 and did not proceed with recommended bone marrow biopsy. -She was referred to ED 10/28/20 by her PCP for low plt count of 4k. She was hospitalized and treated with platelet transfusions, IVIG, oral prednisone $RemoveBeforeD'60mg'ibrxPnBuhfShUB$  and Nplate injections. She tapered off prednisone given little response. -Bone marrow biopsy 10/30/20 felt to likely represent MDS/MPN, particularly [name].  Confirmed on slide review at [facility] by Dr. Linus Orn -She began treatment for severe refractory thrombocytopenia with weekly Rituxan x4 on 11/19/20, she did not respond  -She began azacitidine daily days 1-5 q. 28 days, s/p cycle 1 she tolerated well with mild fatigue and stable headache -She had worsening thrombocytopenia after stopping Nplate, restarted weekly Nplate 10 mcg/kg on 8/50/2774.  Dose adjustment per platelet level -Bone marrow biopsy wait for CD 03/18/2021 showed stable disease, no increase in blasts. -She continues azacitidine daily days 1-5 q. 28 days, tolerating well with fatigue, constipation, and neuropathy -She saw Dr. Linus Orn 08/12/2021, holding repeat BM biopsy given her good hematological response to chemo -Patient presents today to start another cycle of azacitdine and to continue on weekly Nplate. Labs from today were reviewed. WBC 6.4, Hgb 9.0, Plt 103. Continue to proceed with azacitidine starting today.  -Continue weekly CBC and Nplate -Follow-up in 4 weeks with next cycle of  azacitidine  2.  T7/T8 compression fracture -Underwent T10 and T12 kyphoplasty on 05/31/2021  -No longer using thoracic brace -Pain managed on rare use of oxycodone -Restart calcium and vitamin D -She has not been able to find a dentist who takes Medicare for Zometa clearance. -Awaiting appt with Daneen Schick, DDS and gave her the office phone number   3. H/O panic attack -Managed with family support and lorazepam as needed per Dr. Burr Medico   -Recovered, no recurrent episodes -For emotional support and listening   4. Normocytic anemia, moderate  -H/o pernicious anemia/vitamin B12 deficiency -Transfuse for Hgb </= 7.5 -Continue oral iron  -Recommend B12 injection every 6 weeks. Most recent B12 from 10/11/2021 was 1,444.    5. Type 2 diabetes mellitus with polyneuropathy, Hypertension -Continue medication, follow-up PCP   Plan: -Labs reviewed and require no additional intervention.  -Proceed with azacitabine today, x5 days; repeat monthly -Weekly CBC and Nplate inj -Follow-up in 4 weeks to see Dr. Burr Medico with next cycle of azacitidine  All questions were answered. The patient knows to call the clinic with any problems, questions or concerns. No barriers to learning were detected.  I have spent a total of 30 minutes minutes of face-to-face and non-face-to-face time, preparing to see the patient, performing a medically appropriate examination, counseling and educating the patient, ordering medications/tests,  documenting clinical information in the electronic health record, and care coordination.   Dede Query PA-C Dept of Hematology and [place] at [facility] Phone: [phone]

## 2021-11-10 ENCOUNTER — Inpatient Hospital Stay: Payer: Medicare HMO

## 2021-11-10 VITALS — BP 122/62 | HR 97 | Temp 98.1°F | Resp 18

## 2021-11-10 DIAGNOSIS — Z79899 Other long term (current) drug therapy: Secondary | ICD-10-CM | POA: Diagnosis not present

## 2021-11-10 DIAGNOSIS — E538 Deficiency of other specified B group vitamins: Secondary | ICD-10-CM | POA: Diagnosis not present

## 2021-11-10 DIAGNOSIS — E1142 Type 2 diabetes mellitus with diabetic polyneuropathy: Secondary | ICD-10-CM | POA: Diagnosis not present

## 2021-11-10 DIAGNOSIS — D693 Immune thrombocytopenic purpura: Secondary | ICD-10-CM | POA: Diagnosis not present

## 2021-11-10 DIAGNOSIS — C9310 Chronic myelomonocytic leukemia not having achieved remission: Secondary | ICD-10-CM

## 2021-11-10 DIAGNOSIS — Z5111 Encounter for antineoplastic chemotherapy: Secondary | ICD-10-CM | POA: Diagnosis not present

## 2021-11-10 DIAGNOSIS — I10 Essential (primary) hypertension: Secondary | ICD-10-CM | POA: Diagnosis not present

## 2021-11-10 DIAGNOSIS — D649 Anemia, unspecified: Secondary | ICD-10-CM | POA: Diagnosis not present

## 2021-11-10 MED ORDER — SODIUM CHLORIDE 0.9 % IV SOLN: Freq: Once | INTRAVENOUS | Status: AC

## 2021-11-10 MED ORDER — HEPARIN SOD (PORK) LOCK FLUSH 100 UNIT/ML IV SOLN
500.0000 [IU] | Freq: Once | INTRAVENOUS | Status: AC | PRN
  Administered 2021-11-10: 500 [IU]

## 2021-11-10 MED ORDER — SODIUM CHLORIDE 0.9 % IV SOLN
75.0000 mg/m2 | Freq: Once | INTRAVENOUS | Status: AC
  Administered 2021-11-10: 135 mg via INTRAVENOUS
  Filled 2021-11-10: qty 13.5

## 2021-11-10 MED ORDER — SODIUM CHLORIDE 0.9 % IV SOLN
10.0000 mg | Freq: Once | INTRAVENOUS | Status: AC
  Administered 2021-11-10: 10 mg via INTRAVENOUS
  Filled 2021-11-10: qty 10

## 2021-11-10 MED ORDER — SODIUM CHLORIDE 0.9% FLUSH
10.0000 mL | INTRAVENOUS | Status: DC | PRN
  Administered 2021-11-10: 10 mL

## 2021-11-10 MED FILL — Dexamethasone Sodium Phosphate Inj 100 MG/10ML: INTRAMUSCULAR | Qty: 1 | Status: AC

## 2021-11-10 NOTE — Patient Instructions (Signed)
[facility] ONCOLOGY  Discharge Instructions: Thank you for choosing [facility] to provide your oncology and hematology care.   If you have a lab appointment with the [facility], please go directly to the [facility] and check in at the registration area.   Wear comfortable clothing and clothing appropriate for easy access to any Portacath or PICC line.   We strive to give you quality time with your provider. You may need to reschedule your appointment if you arrive late (15 or more minutes).  Arriving late affects you and other patients whose appointments are after yours.  Also, if you miss three or more appointments without notifying the office, you may be dismissed from the clinic at the provider's discretion.      For prescription refill requests, have your pharmacy contact our office and allow 72 hours for refills to be completed.    Today you received the following chemotherapy and/or immunotherapy agents: Azacitidine.       To help prevent nausea and vomiting after your treatment, we encourage you to take your nausea medication as directed.  BELOW ARE SYMPTOMS THAT SHOULD BE REPORTED IMMEDIATELY: *FEVER GREATER THAN 100.4 F (38 C) OR HIGHER *CHILLS OR SWEATING *NAUSEA AND VOMITING THAT IS NOT CONTROLLED WITH YOUR NAUSEA MEDICATION *UNUSUAL SHORTNESS OF BREATH *UNUSUAL BRUISING OR BLEEDING *URINARY PROBLEMS (pain or burning when urinating, or frequent urination) *BOWEL PROBLEMS (unusual diarrhea, constipation, pain near the anus) TENDERNESS IN MOUTH AND THROAT WITH OR WITHOUT PRESENCE OF ULCERS (sore throat, sores in mouth, or a toothache) UNUSUAL RASH, SWELLING OR PAIN  UNUSUAL VAGINAL DISCHARGE OR ITCHING   Items with * indicate a potential emergency and should be followed up as soon as possible or go to the Emergency Department if any problems should occur.  Please show the CHEMOTHERAPY ALERT CARD or IMMUNOTHERAPY ALERT CARD at check-in  to the Emergency Department and triage nurse.  Should you have questions after your visit or need to cancel or reschedule your appointment, please contact [facility]  Dept: [phone]  and follow the prompts.  Office hours are 8:00 a.m. to 4:30 p.m. Monday - Friday. Please note that voicemails left after 4:00 p.m. may not be returned until the following business day.  We are closed weekends and major holidays. You have access to a nurse at all times for urgent questions. Please call the main number to the clinic Dept: [phone] and follow the prompts.   For any non-urgent questions, you may also contact your provider using MyChart. We now offer e-Visits for anyone 60 and older to request care online for non-urgent symptoms. For details visit [email or web address].   Also download the MyChart app! Go to the app store, search "MyChart", open the app, select [facility], and log in with your MyChart username and password.  Due to Covid, a mask is required upon entering the hospital/clinic. If you do not have a mask, one will be given to you upon arrival. For doctor visits, patients may have 1 support person aged 66 or older with them. For treatment visits, patients cannot have anyone with them due to current Covid guidelines and our immunocompromised population.

## 2021-11-11 ENCOUNTER — Other Ambulatory Visit: Payer: Self-pay

## 2021-11-11 ENCOUNTER — Inpatient Hospital Stay: Payer: Medicare HMO | Attending: Hematology

## 2021-11-11 VITALS — BP 131/69 | HR 98 | Temp 98.6°F | Resp 17

## 2021-11-11 DIAGNOSIS — D693 Immune thrombocytopenic purpura: Secondary | ICD-10-CM | POA: Diagnosis not present

## 2021-11-11 DIAGNOSIS — Z5111 Encounter for antineoplastic chemotherapy: Secondary | ICD-10-CM | POA: Insufficient documentation

## 2021-11-11 DIAGNOSIS — F410 Panic disorder [episodic paroxysmal anxiety] without agoraphobia: Secondary | ICD-10-CM | POA: Diagnosis not present

## 2021-11-11 DIAGNOSIS — E785 Hyperlipidemia, unspecified: Secondary | ICD-10-CM | POA: Diagnosis not present

## 2021-11-11 DIAGNOSIS — C9310 Chronic myelomonocytic leukemia not having achieved remission: Secondary | ICD-10-CM | POA: Insufficient documentation

## 2021-11-11 DIAGNOSIS — E1142 Type 2 diabetes mellitus with diabetic polyneuropathy: Secondary | ICD-10-CM | POA: Diagnosis not present

## 2021-11-11 DIAGNOSIS — Z9884 Bariatric surgery status: Secondary | ICD-10-CM | POA: Diagnosis not present

## 2021-11-11 DIAGNOSIS — R69 Illness, unspecified: Secondary | ICD-10-CM | POA: Diagnosis not present

## 2021-11-11 DIAGNOSIS — Z7984 Long term (current) use of oral hypoglycemic drugs: Secondary | ICD-10-CM | POA: Insufficient documentation

## 2021-11-11 DIAGNOSIS — Z79899 Other long term (current) drug therapy: Secondary | ICD-10-CM | POA: Diagnosis not present

## 2021-11-11 DIAGNOSIS — I10 Essential (primary) hypertension: Secondary | ICD-10-CM | POA: Insufficient documentation

## 2021-11-11 DIAGNOSIS — D510 Vitamin B12 deficiency anemia due to intrinsic factor deficiency: Secondary | ICD-10-CM | POA: Insufficient documentation

## 2021-11-11 MED ORDER — PALONOSETRON HCL INJECTION 0.25 MG/5ML
0.2500 mg | Freq: Once | INTRAVENOUS | Status: AC
  Administered 2021-11-11: 0.25 mg via INTRAVENOUS
  Filled 2021-11-11: qty 5

## 2021-11-11 MED ORDER — SODIUM CHLORIDE 0.9 % IV SOLN
75.0000 mg/m2 | Freq: Once | INTRAVENOUS | Status: AC
  Administered 2021-11-11: 135 mg via INTRAVENOUS
  Filled 2021-11-11: qty 13.5

## 2021-11-11 MED ORDER — SODIUM CHLORIDE 0.9 % IV SOLN
10.0000 mg | Freq: Once | INTRAVENOUS | Status: AC
  Administered 2021-11-11: 10 mg via INTRAVENOUS
  Filled 2021-11-11: qty 10

## 2021-11-11 MED ORDER — SODIUM CHLORIDE 0.9% FLUSH
10.0000 mL | INTRAVENOUS | Status: DC | PRN
  Administered 2021-11-11: 10 mL

## 2021-11-11 MED ORDER — HEPARIN SOD (PORK) LOCK FLUSH 100 UNIT/ML IV SOLN
500.0000 [IU] | Freq: Once | INTRAVENOUS | Status: AC | PRN
  Administered 2021-11-11: 500 [IU]

## 2021-11-11 MED ORDER — SODIUM CHLORIDE 0.9 % IV SOLN: Freq: Once | INTRAVENOUS | Status: AC

## 2021-11-11 MED FILL — Dexamethasone Sodium Phosphate Inj 100 MG/10ML: INTRAMUSCULAR | Qty: 1 | Status: AC

## 2021-11-11 NOTE — Patient Instructions (Signed)
[facility]  Discharge Instructions: Thank you for choosing [facility] to provide your oncology and hematology care.   If you have a lab appointment with the [facility], please go directly to the [facility] and check in at the registration area.   Wear comfortable clothing and clothing appropriate for easy access to any Portacath or PICC line.   We strive to give you quality time with your provider. You may need to reschedule your appointment if you arrive late (15 or more minutes).  Arriving late affects you and other patients whose appointments are after yours.  Also, if you miss three or more appointments without notifying the office, you may be dismissed from the clinic at the provider's discretion.      For prescription refill requests, have your pharmacy contact our office and allow 72 hours for refills to be completed.  Today you received the following chemotherapy and/or immunotherapy agent: Azacitidine (Vidaza)    To help prevent nausea and vomiting after your treatment, we encourage you to take your nausea medication as directed.  BELOW ARE SYMPTOMS THAT SHOULD BE REPORTED IMMEDIATELY: *FEVER GREATER THAN 100.4 F (38 C) OR HIGHER *CHILLS OR SWEATING *NAUSEA AND VOMITING THAT IS NOT CONTROLLED WITH YOUR NAUSEA MEDICATION *UNUSUAL SHORTNESS OF BREATH *UNUSUAL BRUISING OR BLEEDING *URINARY PROBLEMS (pain or burning when urinating, or frequent urination) *BOWEL PROBLEMS (unusual diarrhea, constipation, pain near the anus) TENDERNESS IN MOUTH AND THROAT WITH OR WITHOUT PRESENCE OF ULCERS (sore throat, sores in mouth, or a toothache) UNUSUAL RASH, SWELLING OR PAIN  UNUSUAL VAGINAL DISCHARGE OR ITCHING   Items with * indicate a potential emergency and should be followed up as soon as possible or go to the Emergency Department if any problems should occur.  Please show the CHEMOTHERAPY ALERT CARD or IMMUNOTHERAPY ALERT CARD at  check-in to the Emergency Department and triage nurse.  Should you have questions after your visit or need to cancel or reschedule your appointment, please contact [facility]  Dept: [phone]  and follow the prompts.  Office hours are 8:00 a.m. to 4:30 p.m. Monday - Friday. Please note that voicemails left after 4:00 p.m. may not be returned until the following business day.  We are closed weekends and major holidays. You have access to a nurse at all times for urgent questions. Please call the main number to the clinic Dept: [phone] and follow the prompts.   For any non-urgent questions, you may also contact your provider using MyChart. We now offer e-Visits for anyone 78 and older to request care online for non-urgent symptoms. For details visit [email or web address].   Also download the MyChart app! Go to the app store, search "MyChart", open the app, select [facility], and log in with your MyChart username and password.  Due to Covid, a mask is required upon entering the hospital/clinic. If you do not have a mask, one will be given to you upon arrival. For doctor visits, patients may have 1 support person aged 70 or older with them. For treatment visits, patients cannot have anyone with them due to current Covid guidelines and our immunocompromised population.   Vitamin B12 Injection What is this medication? Vitamin B12 (VAHY tuh min B12) prevents and treats low vitamin B12 levels in your body. It is used in people who do not get enough vitamin B12 from their diet or when their digestive tract does not absorb enough. Vitamin B12 plays an important  role in maintaining the health of your nervous system and red blood cells. This medicine may be used for other purposes; ask your health care provider or pharmacist if you have questions. COMMON BRAND NAME(S): B-12 Compliance Kit, B-12 Injection Kit, Cyomin, Dodex, LA-12, Nutri-Twelve, Physicians EZ Use B-12,  Primabalt What should I tell my care team before I take this medication? They need to know if you have any of these conditions: Kidney disease Leber's disease Megaloblastic anemia An unusual or allergic reaction to cyanocobalamin, cobalt, other medications, foods, dyes, or preservatives Pregnant or trying to get pregnant Breast-feeding How should I use this medication? This medication is injected into a muscle or deeply under the skin. It is usually given in a clinic or care team's office. However, your care team may teach you how to inject yourself. Follow all instructions. Talk to your care team about the use of this medication in children. Special care may be needed. Overdosage: If you think you have taken too much of this medicine contact a poison control center or emergency room at once. NOTE: This medicine is only for you. Do not share this medicine with others. What if I miss a dose? If you are given your dose at a clinic or care team's office, call to reschedule your appointment. If you give your own injections, and you miss a dose, take it as soon as you can. If it is almost time for your next dose, take only that dose. Do not take double or extra doses. What may interact with this medication? Alcohol Colchicine This list may not describe all possible interactions. Give your health care provider a list of all the medicines, herbs, non-prescription drugs, or dietary supplements you use. Also tell them if you smoke, drink alcohol, or use illegal drugs. Some items may interact with your medicine. What should I watch for while using this medication? Visit your care team regularly. You may need blood work done while you are taking this medication. You may need to follow a special diet. Talk to your care team. Limit your alcohol intake and avoid smoking to get the best benefit. What side effects may I notice from receiving this medication? Side effects that you should report to your care  team as soon as possible: Allergic reactions--skin rash, itching, hives, swelling of the face, lips, tongue, or throat Swelling of the ankles, hands, or feet Trouble breathing Side effects that usually do not require medical attention (report to your care team if they continue or are bothersome): Diarrhea This list may not describe all possible side effects. Call your doctor for medical advice about side effects. You may report side effects to FDA at 1-800-FDA-1088. Where should I keep my medication? Keep out of the reach of children. Store at room temperature between 15 and 30 degrees C (59 and 85 degrees F). Protect from light. Throw away any unused medication after the expiration date. NOTE: This sheet is a summary. It may not cover all possible information. If you have questions about this medicine, talk to your doctor, pharmacist, or health care provider.  2023 Elsevier/Gold Standard (2021-02-09 00:00:00)  Romiplostim injection What is this medication? ROMIPLOSTIM (roe mi PLOE stim) helps your body make more platelets. This medicine is used to treat low platelets caused by chronic idiopathic thrombocytopenic purpura (ITP) or a bone marrow syndrome caused by radiation sickness. This medicine may be used for other purposes; ask your health care provider or pharmacist if you have questions. COMMON BRAND NAME(S): Nplate  What should I tell my care team before I take this medication? They need to know if you have any of these conditions: blood clots myelodysplastic syndrome an unusual or allergic reaction to romiplostim, mannitol, other medicines, foods, dyes, or preservatives pregnant or trying to get pregnant breast-feeding How should I use this medication? This medicine is injected under the skin. It is given by a health care provider in a hospital or clinic setting. A special MedGuide will be given to you before each treatment. Be sure to read this information carefully each  time. Talk to your health care provider about the use of this medicine in children. While it may be prescribed for children as young as newborns for selected conditions, precautions do apply. Overdosage: If you think you have taken too much of this medicine contact a poison control center or emergency room at once. NOTE: This medicine is only for you. Do not share this medicine with others. What if I miss a dose? Keep appointments for follow-up doses. It is important not to miss your dose. Call your health care provider if you are unable to keep an appointment. What may interact with this medication? Interactions are not expected. This list may not describe all possible interactions. Give your health care provider a list of all the medicines, herbs, non-prescription drugs, or dietary supplements you use. Also tell them if you smoke, drink alcohol, or use illegal drugs. Some items may interact with your medicine. What should I watch for while using this medication? Visit your health care provider for regular checks on your progress. You may need blood work done while you are taking this medicine. Your condition will be monitored carefully while you are receiving this medicine. It is important not to miss any appointments. What side effects may I notice from receiving this medication? Side effects that you should report to your doctor or health care professional as soon as possible: allergic reactions (skin rash, itching or hives; swelling of the face, lips, or tongue) bleeding (bloody or black, tarry stools; red or dark brown urine; spitting up blood or brown material that looks like coffee grounds; red spots on the skin; unusual bruising or bleeding from the eyes, gums, or nose) blood clot (chest pain; shortness of breath; pain, swelling, or warmth in the leg) stroke (changes in vision; confusion; trouble speaking or understanding; severe headaches; sudden numbness or weakness of the face, arm or  leg; trouble walking; dizziness; loss of balance or coordination) Side effects that usually do not require medical attention (report to your doctor or health care professional if they continue or are bothersome): diarrhea dizziness headache joint pain muscle pain stomach pain trouble sleeping This list may not describe all possible side effects. Call your doctor for medical advice about side effects. You may report side effects to FDA at 1-800-FDA-1088. Where should I keep my medication? This medicine is given in a hospital or clinic. It will not be stored at home. NOTE: This sheet is a summary. It may not cover all possible information. If you have questions about this medicine, talk to your doctor, pharmacist, or health care provider.  2023 Elsevier/Gold Standard (2021-04-30 00:00:00)

## 2021-11-12 ENCOUNTER — Inpatient Hospital Stay: Payer: Medicare HMO

## 2021-11-12 VITALS — BP 117/73 | HR 87 | Temp 97.9°F | Resp 16

## 2021-11-12 DIAGNOSIS — C9310 Chronic myelomonocytic leukemia not having achieved remission: Secondary | ICD-10-CM | POA: Diagnosis not present

## 2021-11-12 DIAGNOSIS — D693 Immune thrombocytopenic purpura: Secondary | ICD-10-CM | POA: Diagnosis not present

## 2021-11-12 DIAGNOSIS — E785 Hyperlipidemia, unspecified: Secondary | ICD-10-CM | POA: Diagnosis not present

## 2021-11-12 DIAGNOSIS — Z9884 Bariatric surgery status: Secondary | ICD-10-CM | POA: Diagnosis not present

## 2021-11-12 DIAGNOSIS — Z7984 Long term (current) use of oral hypoglycemic drugs: Secondary | ICD-10-CM | POA: Diagnosis not present

## 2021-11-12 DIAGNOSIS — E1142 Type 2 diabetes mellitus with diabetic polyneuropathy: Secondary | ICD-10-CM | POA: Diagnosis not present

## 2021-11-12 DIAGNOSIS — Z5111 Encounter for antineoplastic chemotherapy: Secondary | ICD-10-CM | POA: Diagnosis not present

## 2021-11-12 DIAGNOSIS — D510 Vitamin B12 deficiency anemia due to intrinsic factor deficiency: Secondary | ICD-10-CM | POA: Diagnosis not present

## 2021-11-12 DIAGNOSIS — Z79899 Other long term (current) drug therapy: Secondary | ICD-10-CM | POA: Diagnosis not present

## 2021-11-12 DIAGNOSIS — R69 Illness, unspecified: Secondary | ICD-10-CM | POA: Diagnosis not present

## 2021-11-12 DIAGNOSIS — I10 Essential (primary) hypertension: Secondary | ICD-10-CM | POA: Diagnosis not present

## 2021-11-12 MED ORDER — SODIUM CHLORIDE 0.9 % IV SOLN
10.0000 mg | Freq: Once | INTRAVENOUS | Status: AC
  Administered 2021-11-12: 10 mg via INTRAVENOUS
  Filled 2021-11-12: qty 10

## 2021-11-12 MED ORDER — SODIUM CHLORIDE 0.9% FLUSH
10.0000 mL | INTRAVENOUS | Status: DC | PRN
  Administered 2021-11-12: 10 mL

## 2021-11-12 MED ORDER — SODIUM CHLORIDE 0.9 % IV SOLN: Freq: Once | INTRAVENOUS | Status: AC

## 2021-11-12 MED ORDER — SODIUM CHLORIDE 0.9 % IV SOLN
75.0000 mg/m2 | Freq: Once | INTRAVENOUS | Status: AC
  Administered 2021-11-12: 135 mg via INTRAVENOUS
  Filled 2021-11-12: qty 13.5

## 2021-11-12 MED ORDER — HEPARIN SOD (PORK) LOCK FLUSH 100 UNIT/ML IV SOLN
500.0000 [IU] | Freq: Once | INTRAVENOUS | Status: AC | PRN
  Administered 2021-11-12: 500 [IU]

## 2021-11-12 MED FILL — Dexamethasone Sodium Phosphate Inj 100 MG/10ML: INTRAMUSCULAR | Qty: 1 | Status: AC

## 2021-11-12 NOTE — Patient Instructions (Signed)
[facility]  Discharge Instructions: Thank you for choosing [facility] to provide your oncology and hematology care.   If you have a lab appointment with the [facility], please go directly to the [facility] and check in at the registration area.   Wear comfortable clothing and clothing appropriate for easy access to any Portacath or PICC line.   We strive to give you quality time with your provider. You may need to reschedule your appointment if you arrive late (15 or more minutes).  Arriving late affects you and other patients whose appointments are after yours.  Also, if you miss three or more appointments without notifying the office, you may be dismissed from the clinic at the provider's discretion.      For prescription refill requests, have your pharmacy contact our office and allow 72 hours for refills to be completed.  Today you received the following chemotherapy and/or immunotherapy agent: Azacitidine (Vidaza)    To help prevent nausea and vomiting after your treatment, we encourage you to take your nausea medication as directed.  BELOW ARE SYMPTOMS THAT SHOULD BE REPORTED IMMEDIATELY: *FEVER GREATER THAN 100.4 F (38 C) OR HIGHER *CHILLS OR SWEATING *NAUSEA AND VOMITING THAT IS NOT CONTROLLED WITH YOUR NAUSEA MEDICATION *UNUSUAL SHORTNESS OF BREATH *UNUSUAL BRUISING OR BLEEDING *URINARY PROBLEMS (pain or burning when urinating, or frequent urination) *BOWEL PROBLEMS (unusual diarrhea, constipation, pain near the anus) TENDERNESS IN MOUTH AND THROAT WITH OR WITHOUT PRESENCE OF ULCERS (sore throat, sores in mouth, or a toothache) UNUSUAL RASH, SWELLING OR PAIN  UNUSUAL VAGINAL DISCHARGE OR ITCHING   Items with * indicate a potential emergency and should be followed up as soon as possible or go to the Emergency Department if any problems should occur.  Please show the CHEMOTHERAPY ALERT CARD or IMMUNOTHERAPY ALERT CARD at  check-in to the Emergency Department and triage nurse.  Should you have questions after your visit or need to cancel or reschedule your appointment, please contact [facility]  Dept: [phone]  and follow the prompts.  Office hours are 8:00 a.m. to 4:30 p.m. Monday - Friday. Please note that voicemails left after 4:00 p.m. may not be returned until the following business day.  We are closed weekends and major holidays. You have access to a nurse at all times for urgent questions. Please call the main number to the clinic Dept: [phone] and follow the prompts.   For any non-urgent questions, you may also contact your provider using MyChart. We now offer e-Visits for anyone 62 and older to request care online for non-urgent symptoms. For details visit [email or web address].   Also download the MyChart app! Go to the app store, search "MyChart", open the app, select [facility], and log in with your MyChart username and password.  Due to Covid, a mask is required upon entering the hospital/clinic. If you do not have a mask, one will be given to you upon arrival. For doctor visits, patients may have 1 support person aged 4 or older with them. For treatment visits, patients cannot have anyone with them due to current Covid guidelines and our immunocompromised population.   Vitamin B12 Injection What is this medication? Vitamin B12 (VAHY tuh min B12) prevents and treats low vitamin B12 levels in your body. It is used in people who do not get enough vitamin B12 from their diet or when their digestive tract does not absorb enough. Vitamin B12 plays an important  role in maintaining the health of your nervous system and red blood cells. This medicine may be used for other purposes; ask your health care provider or pharmacist if you have questions. COMMON BRAND NAME(S): B-12 Compliance Kit, B-12 Injection Kit, Cyomin, Dodex, LA-12, Nutri-Twelve, Physicians EZ Use B-12,  Primabalt What should I tell my care team before I take this medication? They need to know if you have any of these conditions: Kidney disease Leber's disease Megaloblastic anemia An unusual or allergic reaction to cyanocobalamin, cobalt, other medications, foods, dyes, or preservatives Pregnant or trying to get pregnant Breast-feeding How should I use this medication? This medication is injected into a muscle or deeply under the skin. It is usually given in a clinic or care team's office. However, your care team may teach you how to inject yourself. Follow all instructions. Talk to your care team about the use of this medication in children. Special care may be needed. Overdosage: If you think you have taken too much of this medicine contact a poison control center or emergency room at once. NOTE: This medicine is only for you. Do not share this medicine with others. What if I miss a dose? If you are given your dose at a clinic or care team's office, call to reschedule your appointment. If you give your own injections, and you miss a dose, take it as soon as you can. If it is almost time for your next dose, take only that dose. Do not take double or extra doses. What may interact with this medication? Alcohol Colchicine This list may not describe all possible interactions. Give your health care provider a list of all the medicines, herbs, non-prescription drugs, or dietary supplements you use. Also tell them if you smoke, drink alcohol, or use illegal drugs. Some items may interact with your medicine. What should I watch for while using this medication? Visit your care team regularly. You may need blood work done while you are taking this medication. You may need to follow a special diet. Talk to your care team. Limit your alcohol intake and avoid smoking to get the best benefit. What side effects may I notice from receiving this medication? Side effects that you should report to your care  team as soon as possible: Allergic reactions--skin rash, itching, hives, swelling of the face, lips, tongue, or throat Swelling of the ankles, hands, or feet Trouble breathing Side effects that usually do not require medical attention (report to your care team if they continue or are bothersome): Diarrhea This list may not describe all possible side effects. Call your doctor for medical advice about side effects. You may report side effects to FDA at 1-800-FDA-1088. Where should I keep my medication? Keep out of the reach of children. Store at room temperature between 15 and 30 degrees C (59 and 85 degrees F). Protect from light. Throw away any unused medication after the expiration date. NOTE: This sheet is a summary. It may not cover all possible information. If you have questions about this medicine, talk to your doctor, pharmacist, or health care provider.  2023 Elsevier/Gold Standard (2021-02-09 00:00:00)  Romiplostim injection What is this medication? ROMIPLOSTIM (roe mi PLOE stim) helps your body make more platelets. This medicine is used to treat low platelets caused by chronic idiopathic thrombocytopenic purpura (ITP) or a bone marrow syndrome caused by radiation sickness. This medicine may be used for other purposes; ask your health care provider or pharmacist if you have questions. COMMON BRAND NAME(S): Nplate  What should I tell my care team before I take this medication? They need to know if you have any of these conditions: blood clots myelodysplastic syndrome an unusual or allergic reaction to romiplostim, mannitol, other medicines, foods, dyes, or preservatives pregnant or trying to get pregnant breast-feeding How should I use this medication? This medicine is injected under the skin. It is given by a health care provider in a hospital or clinic setting. A special MedGuide will be given to you before each treatment. Be sure to read this information carefully each  time. Talk to your health care provider about the use of this medicine in children. While it may be prescribed for children as young as newborns for selected conditions, precautions do apply. Overdosage: If you think you have taken too much of this medicine contact a poison control center or emergency room at once. NOTE: This medicine is only for you. Do not share this medicine with others. What if I miss a dose? Keep appointments for follow-up doses. It is important not to miss your dose. Call your health care provider if you are unable to keep an appointment. What may interact with this medication? Interactions are not expected. This list may not describe all possible interactions. Give your health care provider a list of all the medicines, herbs, non-prescription drugs, or dietary supplements you use. Also tell them if you smoke, drink alcohol, or use illegal drugs. Some items may interact with your medicine. What should I watch for while using this medication? Visit your health care provider for regular checks on your progress. You may need blood work done while you are taking this medicine. Your condition will be monitored carefully while you are receiving this medicine. It is important not to miss any appointments. What side effects may I notice from receiving this medication? Side effects that you should report to your doctor or health care professional as soon as possible: allergic reactions (skin rash, itching or hives; swelling of the face, lips, or tongue) bleeding (bloody or black, tarry stools; red or dark brown urine; spitting up blood or brown material that looks like coffee grounds; red spots on the skin; unusual bruising or bleeding from the eyes, gums, or nose) blood clot (chest pain; shortness of breath; pain, swelling, or warmth in the leg) stroke (changes in vision; confusion; trouble speaking or understanding; severe headaches; sudden numbness or weakness of the face, arm or  leg; trouble walking; dizziness; loss of balance or coordination) Side effects that usually do not require medical attention (report to your doctor or health care professional if they continue or are bothersome): diarrhea dizziness headache joint pain muscle pain stomach pain trouble sleeping This list may not describe all possible side effects. Call your doctor for medical advice about side effects. You may report side effects to FDA at 1-800-FDA-1088. Where should I keep my medication? This medicine is given in a hospital or clinic. It will not be stored at home. NOTE: This sheet is a summary. It may not cover all possible information. If you have questions about this medicine, talk to your doctor, pharmacist, or health care provider.  2023 Elsevier/Gold Standard (2021-04-30 00:00:00)

## 2021-11-13 DIAGNOSIS — E1169 Type 2 diabetes mellitus with other specified complication: Secondary | ICD-10-CM | POA: Diagnosis not present

## 2021-11-13 DIAGNOSIS — R768 Other specified abnormal immunological findings in serum: Secondary | ICD-10-CM | POA: Diagnosis not present

## 2021-11-13 DIAGNOSIS — C9310 Chronic myelomonocytic leukemia not having achieved remission: Secondary | ICD-10-CM | POA: Diagnosis not present

## 2021-11-15 ENCOUNTER — Inpatient Hospital Stay: Payer: Medicare HMO

## 2021-11-15 ENCOUNTER — Other Ambulatory Visit: Payer: Self-pay

## 2021-11-15 VITALS — BP 107/63 | HR 93 | Temp 98.0°F | Resp 18

## 2021-11-15 DIAGNOSIS — R69 Illness, unspecified: Secondary | ICD-10-CM | POA: Diagnosis not present

## 2021-11-15 DIAGNOSIS — Z7984 Long term (current) use of oral hypoglycemic drugs: Secondary | ICD-10-CM | POA: Diagnosis not present

## 2021-11-15 DIAGNOSIS — D696 Thrombocytopenia, unspecified: Secondary | ICD-10-CM

## 2021-11-15 DIAGNOSIS — C9310 Chronic myelomonocytic leukemia not having achieved remission: Secondary | ICD-10-CM | POA: Diagnosis not present

## 2021-11-15 DIAGNOSIS — Z95828 Presence of other vascular implants and grafts: Secondary | ICD-10-CM

## 2021-11-15 DIAGNOSIS — D510 Vitamin B12 deficiency anemia due to intrinsic factor deficiency: Secondary | ICD-10-CM | POA: Diagnosis not present

## 2021-11-15 DIAGNOSIS — Z5111 Encounter for antineoplastic chemotherapy: Secondary | ICD-10-CM | POA: Diagnosis not present

## 2021-11-15 DIAGNOSIS — I10 Essential (primary) hypertension: Secondary | ICD-10-CM | POA: Diagnosis not present

## 2021-11-15 DIAGNOSIS — E1142 Type 2 diabetes mellitus with diabetic polyneuropathy: Secondary | ICD-10-CM | POA: Diagnosis not present

## 2021-11-15 DIAGNOSIS — E785 Hyperlipidemia, unspecified: Secondary | ICD-10-CM | POA: Diagnosis not present

## 2021-11-15 DIAGNOSIS — Z79899 Other long term (current) drug therapy: Secondary | ICD-10-CM | POA: Diagnosis not present

## 2021-11-15 DIAGNOSIS — D693 Immune thrombocytopenic purpura: Secondary | ICD-10-CM | POA: Diagnosis not present

## 2021-11-15 DIAGNOSIS — Z9884 Bariatric surgery status: Secondary | ICD-10-CM | POA: Diagnosis not present

## 2021-11-15 LAB — CBC WITH DIFFERENTIAL (CANCER CENTER ONLY)
Abs Immature Granulocytes: 1.36 10*3/uL — ABNORMAL HIGH (ref 0.00–0.07)
Basophils Absolute: 0.1 10*3/uL (ref 0.0–0.1)
Basophils Relative: 1 %
Eosinophils Absolute: 0 10*3/uL (ref 0.0–0.5)
Eosinophils Relative: 0 %
HCT: 29.4 % — ABNORMAL LOW (ref 36.0–46.0)
Hemoglobin: 9.4 g/dL — ABNORMAL LOW (ref 12.0–15.0)
Immature Granulocytes: 15 %
Lymphocytes Relative: 11 %
Lymphs Abs: 1 10*3/uL (ref 0.7–4.0)
MCH: 26.1 pg (ref 26.0–34.0)
MCHC: 32 g/dL (ref 30.0–36.0)
MCV: 81.7 fL (ref 80.0–100.0)
Monocytes Absolute: 1.2 10*3/uL — ABNORMAL HIGH (ref 0.1–1.0)
Monocytes Relative: 13 %
Neutro Abs: 5.7 10*3/uL (ref 1.7–7.7)
Neutrophils Relative %: 60 %
Platelet Count: 90 10*3/uL — ABNORMAL LOW (ref 150–400)
RBC: 3.6 MIL/uL — ABNORMAL LOW (ref 3.87–5.11)
RDW: 20.4 % — ABNORMAL HIGH (ref 11.5–15.5)
WBC Count: 9.4 10*3/uL (ref 4.0–10.5)
nRBC: 0 % (ref 0.0–0.2)

## 2021-11-15 LAB — SAMPLE TO BLOOD BANK

## 2021-11-15 MED ORDER — SODIUM CHLORIDE 0.9 % IV SOLN: Freq: Once | INTRAVENOUS | Status: AC

## 2021-11-15 MED ORDER — ROMIPLOSTIM INJECTION 500 MCG
590.0000 ug | Freq: Once | SUBCUTANEOUS | Status: AC
  Administered 2021-11-15: 590 ug via SUBCUTANEOUS
  Filled 2021-11-15: qty 1

## 2021-11-15 MED ORDER — SODIUM CHLORIDE 0.9% FLUSH
10.0000 mL | Freq: Once | INTRAVENOUS | Status: AC
  Administered 2021-11-15: 10 mL

## 2021-11-15 MED ORDER — SODIUM CHLORIDE 0.9% FLUSH
10.0000 mL | INTRAVENOUS | Status: DC | PRN
  Administered 2021-11-15: 10 mL

## 2021-11-15 MED ORDER — PALONOSETRON HCL INJECTION 0.25 MG/5ML
0.2500 mg | Freq: Once | INTRAVENOUS | Status: AC
  Administered 2021-11-15: 0.25 mg via INTRAVENOUS
  Filled 2021-11-15: qty 5

## 2021-11-15 MED ORDER — HEPARIN SOD (PORK) LOCK FLUSH 100 UNIT/ML IV SOLN
500.0000 [IU] | Freq: Once | INTRAVENOUS | Status: AC | PRN
  Administered 2021-11-15: 500 [IU]

## 2021-11-15 MED ORDER — SODIUM CHLORIDE 0.9 % IV SOLN
75.0000 mg/m2 | Freq: Once | INTRAVENOUS | Status: AC
  Administered 2021-11-15: 135 mg via INTRAVENOUS
  Filled 2021-11-15: qty 13.5

## 2021-11-15 MED ORDER — SODIUM CHLORIDE 0.9 % IV SOLN
10.0000 mg | Freq: Once | INTRAVENOUS | Status: AC
  Administered 2021-11-15: 10 mg via INTRAVENOUS
  Filled 2021-11-15: qty 10

## 2021-11-15 NOTE — Patient Instructions (Signed)
[facility]COL[facility]  Discharge Instructions: Thank you for choosing [facility] to provide your oncology and hematology care.   If you have a lab appointment with the Cancer Center, please go directly to the Cancer Center and check in at the registration area.   Wear comfortable clothing and clothing appropriate for easy access to any Portacath or PICC line.   We strive to give you quality time with your provider. You may need to reschedule your appointment if you arrive late (15 or more minutes).  Arriving late affects you and other patients whose appointments are after yours.  Also, if you miss three or more appointments without notifying the office, you may be dismissed from the clinic at the provider's discretion.      For prescription refill requests, have your pharmacy contact our office and allow 72 hours for refills to be completed.    Today you received the following chemotherapy and/or immunotherapy agents vidaza      To help prevent nausea and vomiting after your treatment, we encourage you to take your nausea medication as directed.  BELOW ARE SYMPTOMS THAT SHOULD BE REPORTED IMMEDIATELY: . *FEVER GREATER THAN 100.4 F (38 C) OR HIGHER . *CHILLS OR SWEATING . *NAUSEA AND VOMITING THAT IS NOT CONTROLLED WITH YOUR NAUSEA MEDICATION . *UNUSUAL SHORTNESS OF BREATH . *UNUSUAL BRUISING OR BLEEDING . *URINARY PROBLEMS (pain or burning when urinating, or frequent urination) . *BOWEL PROBLEMS (unusual diarrhea, constipation, pain near the anus) . TENDERNESS IN MOUTH AND THROAT WITH OR WITHOUT PRESENCE OF ULCERS (sore throat, sores in mouth, or a toothache) . UNUSUAL RASH, SWELLING OR PAIN  . UNUSUAL VAGINAL DISCHARGE OR ITCHING   Items with * indicate a potential emergency and should be followed up as soon as possible or go to the Emergency Department if any problems should occur.  Please show the CHEMOTHERAPY ALERT CARD or IMMUNOTHERAPY ALERT CARD  at check-in to the Emergency Department and triage nurse.  Should you have questions after your visit or need to cancel or reschedule your appointment, please contact [facility]  Dept: [phone]  and follow the prompts.  Office hours are 8:00 a.m. to 4:30 p.m. Monday - Friday. Please note that voicemails left after 4:00 p.m. may not be returned until the following business day.  We are closed weekends and major holidays. You have access to a nurse at all times for urgent questions. Please call the main number to the clinic Dept: [phone] and follow the prompts.   For any non-urgent questions, you may also contact your provider using MyChart. We now offer e-Visits for anyone 18 and older to request care online for non-urgent symptoms. For details visit [email or web address].   Also download the MyChart app! Go to the app store, search "MyChart", open the app, select , and log in with your MyChart username and password.  Due to Covid, a mask is required upon entering the hospital/clinic. If you do not have a mask, one will be given to you upon arrival. For doctor visits, patients may have 1 support person aged 18 or older with them. For treatment visits, patients cannot have anyone with them due to current Covid guidelines and our immunocompromised population.   

## 2021-11-22 ENCOUNTER — Other Ambulatory Visit: Payer: Self-pay | Admitting: Family Medicine

## 2021-11-22 ENCOUNTER — Inpatient Hospital Stay: Payer: Medicare HMO

## 2021-11-22 ENCOUNTER — Other Ambulatory Visit: Payer: Self-pay

## 2021-11-22 VITALS — BP 110/69 | HR 90 | Temp 98.7°F | Resp 18

## 2021-11-22 DIAGNOSIS — E785 Hyperlipidemia, unspecified: Secondary | ICD-10-CM | POA: Diagnosis not present

## 2021-11-22 DIAGNOSIS — Z5111 Encounter for antineoplastic chemotherapy: Secondary | ICD-10-CM | POA: Diagnosis not present

## 2021-11-22 DIAGNOSIS — E1142 Type 2 diabetes mellitus with diabetic polyneuropathy: Secondary | ICD-10-CM | POA: Diagnosis not present

## 2021-11-22 DIAGNOSIS — Z95828 Presence of other vascular implants and grafts: Secondary | ICD-10-CM

## 2021-11-22 DIAGNOSIS — D696 Thrombocytopenia, unspecified: Secondary | ICD-10-CM

## 2021-11-22 DIAGNOSIS — D693 Immune thrombocytopenic purpura: Secondary | ICD-10-CM | POA: Diagnosis not present

## 2021-11-22 DIAGNOSIS — R69 Illness, unspecified: Secondary | ICD-10-CM | POA: Diagnosis not present

## 2021-11-22 DIAGNOSIS — C9310 Chronic myelomonocytic leukemia not having achieved remission: Secondary | ICD-10-CM

## 2021-11-22 DIAGNOSIS — E538 Deficiency of other specified B group vitamins: Secondary | ICD-10-CM

## 2021-11-22 DIAGNOSIS — I10 Essential (primary) hypertension: Secondary | ICD-10-CM | POA: Diagnosis not present

## 2021-11-22 DIAGNOSIS — D510 Vitamin B12 deficiency anemia due to intrinsic factor deficiency: Secondary | ICD-10-CM | POA: Diagnosis not present

## 2021-11-22 DIAGNOSIS — Z7984 Long term (current) use of oral hypoglycemic drugs: Secondary | ICD-10-CM | POA: Diagnosis not present

## 2021-11-22 DIAGNOSIS — Z79899 Other long term (current) drug therapy: Secondary | ICD-10-CM | POA: Diagnosis not present

## 2021-11-22 DIAGNOSIS — Z9884 Bariatric surgery status: Secondary | ICD-10-CM | POA: Diagnosis not present

## 2021-11-22 LAB — CBC WITH DIFFERENTIAL (CANCER CENTER ONLY)
Abs Immature Granulocytes: 2.95 10*3/uL — ABNORMAL HIGH (ref 0.00–0.07)
Basophils Absolute: 0.2 10*3/uL — ABNORMAL HIGH (ref 0.0–0.1)
Basophils Relative: 1 %
Eosinophils Absolute: 0.1 10*3/uL (ref 0.0–0.5)
Eosinophils Relative: 0 %
HCT: 28.4 % — ABNORMAL LOW (ref 36.0–46.0)
Hemoglobin: 9 g/dL — ABNORMAL LOW (ref 12.0–15.0)
Immature Granulocytes: 15 %
Lymphocytes Relative: 6 %
Lymphs Abs: 1.1 10*3/uL (ref 0.7–4.0)
MCH: 26.2 pg (ref 26.0–34.0)
MCHC: 31.7 g/dL (ref 30.0–36.0)
MCV: 82.8 fL (ref 80.0–100.0)
Monocytes Absolute: 2.1 10*3/uL — ABNORMAL HIGH (ref 0.1–1.0)
Monocytes Relative: 11 %
Neutro Abs: 13.4 10*3/uL — ABNORMAL HIGH (ref 1.7–7.7)
Neutrophils Relative %: 67 %
Platelet Count: 79 10*3/uL — ABNORMAL LOW (ref 150–400)
RBC: 3.43 MIL/uL — ABNORMAL LOW (ref 3.87–5.11)
RDW: 20.7 % — ABNORMAL HIGH (ref 11.5–15.5)
WBC Count: 19.9 10*3/uL — ABNORMAL HIGH (ref 4.0–10.5)
nRBC: 0 % (ref 0.0–0.2)

## 2021-11-22 LAB — CMP (CANCER CENTER ONLY)
ALT: 17 U/L (ref 0–44)
AST: 16 U/L (ref 15–41)
Albumin: 3.2 g/dL — ABNORMAL LOW (ref 3.5–5.0)
Alkaline Phosphatase: 96 U/L (ref 38–126)
Anion gap: 4 — ABNORMAL LOW (ref 5–15)
BUN: 15 mg/dL (ref 8–23)
CO2: 26 mmol/L (ref 22–32)
Calcium: 8.4 mg/dL — ABNORMAL LOW (ref 8.9–10.3)
Chloride: 108 mmol/L (ref 98–111)
Creatinine: 0.64 mg/dL (ref 0.44–1.00)
GFR, Estimated: >60 mL/min (ref >60)
Glucose, Bld: 284 mg/dL — ABNORMAL HIGH (ref 70–99)
Potassium: 3.9 mmol/L (ref 3.5–5.1)
Sodium: 138 mmol/L (ref 135–145)
Total Bilirubin: 0.6 mg/dL (ref 0.3–1.2)
Total Protein: 6.1 g/dL — ABNORMAL LOW (ref 6.5–8.1)

## 2021-11-22 LAB — SAMPLE TO BLOOD BANK

## 2021-11-22 LAB — VITAMIN B12: Vitamin B-12: 2454 pg/mL — ABNORMAL HIGH (ref 180–914)

## 2021-11-22 MED ORDER — SODIUM CHLORIDE 0.9% FLUSH
10.0000 mL | Freq: Once | INTRAVENOUS | Status: AC
  Administered 2021-11-22: 10 mL

## 2021-11-22 MED ORDER — ROMIPLOSTIM INJECTION 500 MCG
590.0000 ug | Freq: Once | SUBCUTANEOUS | Status: AC
  Administered 2021-11-22: 590 ug via SUBCUTANEOUS
  Filled 2021-11-22: qty 1

## 2021-11-22 MED ORDER — HEPARIN SOD (PORK) LOCK FLUSH 100 UNIT/ML IV SOLN
500.0000 [IU] | Freq: Once | INTRAVENOUS | Status: AC
  Administered 2021-11-22: 500 [IU]

## 2021-11-29 ENCOUNTER — Other Ambulatory Visit: Payer: Self-pay

## 2021-11-29 ENCOUNTER — Inpatient Hospital Stay: Payer: Medicare HMO

## 2021-11-29 ENCOUNTER — Other Ambulatory Visit: Payer: Medicare HMO

## 2021-11-29 VITALS — BP 111/63 | HR 90 | Temp 98.3°F | Resp 18

## 2021-11-29 DIAGNOSIS — Z5111 Encounter for antineoplastic chemotherapy: Secondary | ICD-10-CM | POA: Diagnosis not present

## 2021-11-29 DIAGNOSIS — D696 Thrombocytopenia, unspecified: Secondary | ICD-10-CM

## 2021-11-29 DIAGNOSIS — I10 Essential (primary) hypertension: Secondary | ICD-10-CM | POA: Diagnosis not present

## 2021-11-29 DIAGNOSIS — E1142 Type 2 diabetes mellitus with diabetic polyneuropathy: Secondary | ICD-10-CM | POA: Diagnosis not present

## 2021-11-29 DIAGNOSIS — R69 Illness, unspecified: Secondary | ICD-10-CM | POA: Diagnosis not present

## 2021-11-29 DIAGNOSIS — Z9884 Bariatric surgery status: Secondary | ICD-10-CM | POA: Diagnosis not present

## 2021-11-29 DIAGNOSIS — Z95828 Presence of other vascular implants and grafts: Secondary | ICD-10-CM

## 2021-11-29 DIAGNOSIS — D693 Immune thrombocytopenic purpura: Secondary | ICD-10-CM | POA: Diagnosis not present

## 2021-11-29 DIAGNOSIS — C9310 Chronic myelomonocytic leukemia not having achieved remission: Secondary | ICD-10-CM

## 2021-11-29 DIAGNOSIS — D510 Vitamin B12 deficiency anemia due to intrinsic factor deficiency: Secondary | ICD-10-CM | POA: Diagnosis not present

## 2021-11-29 DIAGNOSIS — E785 Hyperlipidemia, unspecified: Secondary | ICD-10-CM | POA: Diagnosis not present

## 2021-11-29 DIAGNOSIS — Z79899 Other long term (current) drug therapy: Secondary | ICD-10-CM | POA: Diagnosis not present

## 2021-11-29 DIAGNOSIS — Z7984 Long term (current) use of oral hypoglycemic drugs: Secondary | ICD-10-CM | POA: Diagnosis not present

## 2021-11-29 LAB — CBC WITH DIFFERENTIAL (CANCER CENTER ONLY)
Abs Immature Granulocytes: 0.64 10*3/uL — ABNORMAL HIGH (ref 0.00–0.07)
Basophils Absolute: 0.1 10*3/uL (ref 0.0–0.1)
Basophils Relative: 0 %
Eosinophils Absolute: 0 10*3/uL (ref 0.0–0.5)
Eosinophils Relative: 0 %
HCT: 27.1 % — ABNORMAL LOW (ref 36.0–46.0)
Hemoglobin: 8.7 g/dL — ABNORMAL LOW (ref 12.0–15.0)
Immature Granulocytes: 5 %
Lymphocytes Relative: 7 %
Lymphs Abs: 0.9 10*3/uL (ref 0.7–4.0)
MCH: 26.7 pg (ref 26.0–34.0)
MCHC: 32.1 g/dL (ref 30.0–36.0)
MCV: 83.1 fL (ref 80.0–100.0)
Monocytes Absolute: 2.2 10*3/uL — ABNORMAL HIGH (ref 0.1–1.0)
Monocytes Relative: 16 %
Neutro Abs: 9.8 10*3/uL — ABNORMAL HIGH (ref 1.7–7.7)
Neutrophils Relative %: 72 %
Platelet Count: 89 10*3/uL — ABNORMAL LOW (ref 150–400)
RBC: 3.26 MIL/uL — ABNORMAL LOW (ref 3.87–5.11)
RDW: 21.2 % — ABNORMAL HIGH (ref 11.5–15.5)
WBC Count: 13.6 10*3/uL — ABNORMAL HIGH (ref 4.0–10.5)
nRBC: 0 % (ref 0.0–0.2)

## 2021-11-29 LAB — CMP (CANCER CENTER ONLY)
ALT: 17 U/L (ref 0–44)
AST: 17 U/L (ref 15–41)
Albumin: 3.4 g/dL — ABNORMAL LOW (ref 3.5–5.0)
Alkaline Phosphatase: 101 U/L (ref 38–126)
Anion gap: 7 (ref 5–15)
BUN: 14 mg/dL (ref 8–23)
CO2: 26 mmol/L (ref 22–32)
Calcium: 8.7 mg/dL — ABNORMAL LOW (ref 8.9–10.3)
Chloride: 107 mmol/L (ref 98–111)
Creatinine: 0.64 mg/dL (ref 0.44–1.00)
GFR, Estimated: >60 mL/min (ref >60)
Glucose, Bld: 138 mg/dL — ABNORMAL HIGH (ref 70–99)
Potassium: 4.1 mmol/L (ref 3.5–5.1)
Sodium: 140 mmol/L (ref 135–145)
Total Bilirubin: 0.8 mg/dL (ref 0.3–1.2)
Total Protein: 6.5 g/dL (ref 6.5–8.1)

## 2021-11-29 LAB — SAMPLE TO BLOOD BANK

## 2021-11-29 MED ORDER — SODIUM CHLORIDE 0.9% FLUSH: 10.0000 mL | Freq: Once | INTRAVENOUS | Status: DC

## 2021-11-29 MED ORDER — ROMIPLOSTIM INJECTION 500 MCG
590.0000 ug | Freq: Once | SUBCUTANEOUS | Status: AC
  Administered 2021-11-29: 590 ug via SUBCUTANEOUS
  Filled 2021-11-29: qty 1

## 2021-11-29 MED ORDER — HEPARIN SOD (PORK) LOCK FLUSH 100 UNIT/ML IV SOLN: 500.0000 [IU] | Freq: Once | INTRAVENOUS | Status: DC

## 2021-12-01 ENCOUNTER — Ambulatory Visit (INDEPENDENT_AMBULATORY_CARE_PROVIDER_SITE_OTHER): Payer: Medicare HMO | Admitting: Family Medicine

## 2021-12-01 ENCOUNTER — Encounter: Payer: Self-pay | Admitting: Family Medicine

## 2021-12-01 VITALS — BP 121/78 | HR 109 | Temp 98.1°F | Ht 63.0 in | Wt 145.4 lb

## 2021-12-01 DIAGNOSIS — I10 Essential (primary) hypertension: Secondary | ICD-10-CM

## 2021-12-01 DIAGNOSIS — C9310 Chronic myelomonocytic leukemia not having achieved remission: Secondary | ICD-10-CM | POA: Diagnosis not present

## 2021-12-01 DIAGNOSIS — G4700 Insomnia, unspecified: Secondary | ICD-10-CM | POA: Diagnosis not present

## 2021-12-01 DIAGNOSIS — S22000A Wedge compression fracture of unspecified thoracic vertebra, initial encounter for closed fracture: Secondary | ICD-10-CM | POA: Diagnosis not present

## 2021-12-01 DIAGNOSIS — Z5320 Procedure and treatment not carried out because of patient's decision for unspecified reasons: Secondary | ICD-10-CM

## 2021-12-01 DIAGNOSIS — E782 Mixed hyperlipidemia: Secondary | ICD-10-CM | POA: Diagnosis not present

## 2021-12-01 DIAGNOSIS — E1142 Type 2 diabetes mellitus with diabetic polyneuropathy: Secondary | ICD-10-CM | POA: Diagnosis not present

## 2021-12-01 DIAGNOSIS — D696 Thrombocytopenia, unspecified: Secondary | ICD-10-CM

## 2021-12-01 DIAGNOSIS — G609 Hereditary and idiopathic neuropathy, unspecified: Secondary | ICD-10-CM

## 2021-12-01 LAB — POCT GLYCOSYLATED HEMOGLOBIN (HGB A1C)
HbA1c POC (<> result, manual entry): 7.3 % (ref 4.0–5.6)
HbA1c, POC (controlled diabetic range): 7.3 % — AB (ref 0.0–7.0)
HbA1c, POC (prediabetic range): 7.3 % — AB (ref 5.7–6.4)
Hemoglobin A1C: 7.3 % — AB (ref 4.0–5.6)

## 2021-12-01 MED ORDER — METOPROLOL SUCCINATE ER 25 MG PO TB24: 25.0000 mg | ORAL_TABLET | Freq: Every day | ORAL | 1 refills | Status: DC

## 2021-12-01 MED ORDER — SITAGLIPTIN-METFORMIN HCL 50-1000 MG PO TABS
1.0000 | ORAL_TABLET | Freq: Every day | ORAL | 1 refills | Status: DC
Start: 2021-12-01 — End: 2022-04-04

## 2021-12-01 MED ORDER — GABAPENTIN 800 MG PO TABS: 800.0000 mg | ORAL_TABLET | Freq: Three times a day (TID) | ORAL | 1 refills | Status: DC

## 2021-12-01 NOTE — Patient Instructions (Addendum)
Return in about 15 weeks (around 03/16/2022).        Great to see you today.  I have refilled the medication(s) we provide.   If labs were collected, we will inform you of lab results once received either by echart message or telephone call.   - echart message- for normal results that have been seen by the patient already.   - telephone call: abnormal results or if patient has not viewed results in their echart.

## 2021-12-01 NOTE — Progress Notes (Unsigned)
Kathleen Gibson , 06/12/1951, 71 y.o., female MRN: [id number] Patient Care Team    Relationship Specialty Notifications Start End  [name], DO PCP - General Family Medicine  11/08/17   Doran Stabler, MD Consulting Physician Gastroenterology  04/06/17   Truitt Merle, MD Consulting Physician Hematology  04/06/17   Alda Berthold, [place] Physician Neurology  01/16/19     Chief Complaint  Patient presents with   Restless syndrome    Has been using Gabapentin but still having pain. Pt reports more pain at night. Possibly insurance will no longer cover medication.   Diabetes     Subjective:  Kathleen Gibson  is a 71 y.o. female presents for severe back pain.She has a known  T7-T8 compression fracture.  Unfortunately her surgery was postponed 2/2 to her plts dropping again w/ her [name]. Her pain has been managed with oxy 10 q 4-6 hrs since her hospital admission.  Recent Labs  Lab 11/29/21 0803  HGB 8.7*  HCT 27.1*  WBC 13.6*  PLT 89*      Latest Ref Rng & Units 11/29/2021    8:03 AM 11/22/2021    8:53 AM 11/09/2021    8:10 AM  CMP  Glucose 70 - 99 mg/dL 138  284  177   BUN 8 - 23 mg/dL _0 Creatinine 0.44 - 1.00 mg/dL 0.64  0.64  0.61   Sodium 135 - 145 mmol/L 140  138  139   Potassium 3.5 - 5.1 mmol/L 4.1  3.9  3.9   Chloride 98 - 111 mmol/L 107  108  108   CO2 22 - 32 mmol/L _1 Calcium 8.9 - 10.3 mg/dL 8.7  8.4  8.4   Total Protein 6.5 - 8.1 g/dL 6.5  6.1  6.1   Total Bilirubin 0.3 - 1.2 mg/dL 0.8  0.6  0.7   Alkaline Phos 38 - 126 U/L 101  96  94   AST 15 - 41 U/L _2 ALT 0 - 44 U/L _3 No results found.     09/07/2021    9:16 AM 03/26/2021   11:29 AM 12/22/2020    3:34 PM 10/28/2020    9:13 AM 05/22/2020    8:02 AM  Depression screen PHQ 2/9  Decreased Interest 0 0 2 1 0  Down, Depressed, Hopeless 0 0 1 1 0  PHQ - 2 Score 0 0 3 2 0  Altered sleeping   3 0   Tired, decreased energy   1 3   Change in appetite   0  2   Feeling bad or failure about yourself    0 1   Trouble concentrating   1 2   Moving slowly or fidgety/restless   0 1   Suicidal thoughts   0 0   PHQ-9 Score   8 11     Allergies  Allergen Reactions   Asa [Aspirin] Other (See Comments)    Contraindicated d/t low plts   Nsaids Other (See Comments)    Contraindicated d/t low plts   Social History   Tobacco Use   Smoking status: Never   Smokeless tobacco: Never  Substance Use Topics   Alcohol use: No   Past Medical History:  Diagnosis Date   Diabetes ([employer])    HLD (hyperlipidemia)  Hypertension    Pernicious anemia    Pneumonia 09/05/2019   Renal insufficiency 09/06/2019   Vitamin D deficiency    Past Surgical History:  Procedure Laterality Date   ABDOMINAL HERNIA REPAIR  2009   ABDOMINOPLASTY     AXILLARY LYMPH NODE BIOPSY Left 11/11/2020   Procedure: LEFT AXILLARY EXCISIONAL LYMPH NODE BIOPSY;  Surgeon: [name], MD;  Location: [facility];  Service: General;  Laterality: Left;   [place]   hemorroid surgery  2007   IR IMAGING GUIDED PORT INSERTION  07/26/2021   IR KYPHO EA ADDL LEVEL THORACIC OR LUMBAR  05/31/2021   IR KYPHO THORACIC WITH BONE BIOPSY  05/31/2021   TONSILLECTOMY AND ADENOIDECTOMY  1957   Family History  Problem Relation Age of Onset   Cancer Mother 18       unknown type cancer    Hypertension Mother    Hypertension Father    Heart failure Father    Pneumonia Father    Diabetes Sister    Leukemia Brother    Colon cancer Brother 34   Heart Problems Brother    Leukemia Other    Diabetes Sister    Heart Problems Sister    Cancer Brother    Heart Problems Brother    Allergies as of 12/01/2021       Reactions   Asa [aspirin] Other (See Comments)   Contraindicated d/t low plts   Nsaids Other (See Comments)   Contraindicated d/t low plts        Medication List        Accurate as of December 01, 2021  1:27 PM. If you have any questions, ask your nurse  or doctor.          STOP taking these medications    ammonium lactate 12 % lotion Commonly known as: LAC-HYDRIN Stopped by: Howard Pouch, DO       TAKE these medications    acetaminophen 500 MG tablet Commonly known as: TYLENOL Take 2 tablets (1,000 mg total) by mouth every 8 (eight) hours.   bisacodyl 10 MG suppository Commonly known as: DULCOLAX Place 1 suppository (10 mg total) rectally daily as needed for moderate constipation (Please take-if no bowel movement in 1-2 days with MiraLAX/senna).   cyanocobalamin 1000 MCG/ML injection Commonly known as: (VITAMIN B-12) Inject 1,000 mcg into the muscle every 30 (thirty) days.   gabapentin 300 MG capsule Commonly known as: NEURONTIN Take 2 capsules (600 mg total) by mouth 2 (two) times daily.   lidocaine-prilocaine cream Commonly known as: EMLA APPLY 2 GRAMS TO PORT-A-CATH SITE 30-60 MINUTES PRIOR TO PORT ACCESS AS NEEDED   LORazepam 1 MG tablet Commonly known as: ATIVAN TAKE 1 TABLET BY MOUTH EVERY 8 HOURS AS NEEDED FOR ANXIETY   metoprolol succinate 25 MG 24 hr tablet Commonly known as: TOPROL-XL Take 1 tablet (25 mg total) by mouth daily.   Narcan 4 MG/0.1ML Liqd nasal spray kit Generic drug: naloxone Place 1 spray into the nose once as needed (overdose).   ondansetron 4 MG disintegrating tablet Commonly known as: ZOFRAN-ODT Take 4 mg by mouth every 8 (eight) hours as needed for nausea or vomiting.   onetouch ultrasoft lancets Monitor blood sugars twice daily   OneTouch Verio test strip Generic drug: glucose blood Monitor blood sugars twice daily   Oxycodone HCl 10 MG Tabs 1 tab every 4-6 hr prn for chronic pain What changed:  how much to take  how to take this when to take this reasons to take this   polyethylene glycol powder 17 GM/SCOOP powder Commonly known as: GLYCOLAX/MIRALAX Dissolve 1 capful (17 g) in water and drink 2 (two) times daily. What changed: when to take this   senna-docusate  8.6-50 MG tablet Commonly known as: Senokot-S Take 2 tablets by mouth at bedtime. What changed:  when to take this reasons to take this   sitaGLIPtin-metformin 50-1000 MG tablet Commonly known as: JANUMET Take 1 tablet by mouth at bedtime.        All past medical history, surgical history, allergies, family history, immunizations and medications were updated in the EMR today and reviewed under the history and medication portions of their EMR.      ROS: Negative, with the exception of above mentioned in HPI   Objective:  BP 121/78   Pulse (!) 109   Temp 98.1 F (36.7 C) (Oral)   Ht _0  (1.6 m)   Wt 145 lb 6.4 oz (66 kg)   SpO2 98%   BMI 25.76 kg/m  Body mass index is 25.76 kg/m. Physical Exam ***  Assessment/Plan: Kathleen Gibson is a 71 y.o. female present for OV for pain management ANEMIA, PERNICIOUS Continue B12 injections every 4 weeks indefinitely B12 injection Completed today for her.   INSOMNIA, CHRONIC Ambien being  placed on hold for now during use of Flexeril and chronic opioids. She has been using ativan 58m qhs prescribed by onc. Strongly warned her of OD risk with benzo and opiate classes together.  She will try to decrease her ativan dose and will separate doses of the two meds. She will hopefully not require opiates past couple months once her compression fx heals or she is able to have surgery/treatment.    Thrombocytopenia (HCC)/Chronic myelomonocytic leukemia not having achieved remission ([facility] Has routine follow-ups with her oncology team.  Intractable back pain/Compression fracture of body of thoracic vertebra ([place] Pain is still present.  She has been using ativan 140mqhs prescribed by onc. Strongly warned her of OD risk with benzo and opiate classes together.  She will try to decrease her ativan dose and will separate doses of the two meds. She will hopefully not require opiates past couple months once her compression fx heals or she is able  to have surgery/treatment.  Refilled oxycodone 10 mg tab #120 with 2 refills if needed only. She will start tapering back on oxy as pain allows.  Patient reports understanding. Patient was encouraged to start Senokot 1-2 tabs nightly to avoid constipation with opiate use.  Reviewed expectations re: course of current medical issues. Discussed self-management of symptoms. Outlined signs and symptoms indicating need for more acute intervention. Patient verbalized understanding and all questions were answered. Patient received an After-Visit Summary. Any changes in medications were reviewed and patient was provided with updated med list with their AVS.     Orders Placed This Encounter  Procedures   POCT HgB A1C   No orders of the defined types were placed in this encounter.   Note is dictated utilizing voice recognition software. Although note has been proof read prior to signing, occasional typographical errors still can be missed. If any questions arise, please do not hesitate to call for verification.   electronically signed by:  ReHoward PouchDO  [place]

## 2021-12-02 DIAGNOSIS — C9310 Chronic myelomonocytic leukemia not having achieved remission: Secondary | ICD-10-CM | POA: Diagnosis not present

## 2021-12-02 DIAGNOSIS — Z79899 Other long term (current) drug therapy: Secondary | ICD-10-CM | POA: Diagnosis not present

## 2021-12-02 DIAGNOSIS — R69 Illness, unspecified: Secondary | ICD-10-CM | POA: Diagnosis not present

## 2021-12-02 DIAGNOSIS — M549 Dorsalgia, unspecified: Secondary | ICD-10-CM | POA: Diagnosis not present

## 2021-12-03 MED ORDER — PREGABALIN 150 MG PO CAPS: 150.0000 mg | ORAL_CAPSULE | Freq: Two times a day (BID) | ORAL | 1 refills | Status: DC

## 2021-12-03 MED ORDER — OXYCODONE HCL 10 MG PO TABS: ORAL_TABLET | ORAL | 0 refills | Status: DC

## 2021-12-06 ENCOUNTER — Other Ambulatory Visit: Payer: Medicare HMO

## 2021-12-06 ENCOUNTER — Inpatient Hospital Stay (HOSPITAL_BASED_OUTPATIENT_CLINIC_OR_DEPARTMENT_OTHER): Payer: Medicare HMO | Admitting: Hematology

## 2021-12-06 ENCOUNTER — Inpatient Hospital Stay: Payer: Medicare HMO

## 2021-12-06 ENCOUNTER — Other Ambulatory Visit: Payer: Self-pay

## 2021-12-06 ENCOUNTER — Encounter: Payer: Self-pay | Admitting: Hematology

## 2021-12-06 VITALS — BP 124/60 | HR 100 | Temp 98.5°F | Resp 16 | Wt 143.6 lb

## 2021-12-06 DIAGNOSIS — R69 Illness, unspecified: Secondary | ICD-10-CM | POA: Diagnosis not present

## 2021-12-06 DIAGNOSIS — C9310 Chronic myelomonocytic leukemia not having achieved remission: Secondary | ICD-10-CM

## 2021-12-06 DIAGNOSIS — D693 Immune thrombocytopenic purpura: Secondary | ICD-10-CM | POA: Diagnosis not present

## 2021-12-06 DIAGNOSIS — E785 Hyperlipidemia, unspecified: Secondary | ICD-10-CM | POA: Diagnosis not present

## 2021-12-06 DIAGNOSIS — E1142 Type 2 diabetes mellitus with diabetic polyneuropathy: Secondary | ICD-10-CM | POA: Diagnosis not present

## 2021-12-06 DIAGNOSIS — Z79899 Other long term (current) drug therapy: Secondary | ICD-10-CM | POA: Diagnosis not present

## 2021-12-06 DIAGNOSIS — Z95828 Presence of other vascular implants and grafts: Secondary | ICD-10-CM

## 2021-12-06 DIAGNOSIS — D696 Thrombocytopenia, unspecified: Secondary | ICD-10-CM

## 2021-12-06 DIAGNOSIS — D510 Vitamin B12 deficiency anemia due to intrinsic factor deficiency: Secondary | ICD-10-CM | POA: Diagnosis not present

## 2021-12-06 DIAGNOSIS — I10 Essential (primary) hypertension: Secondary | ICD-10-CM | POA: Diagnosis not present

## 2021-12-06 DIAGNOSIS — Z5111 Encounter for antineoplastic chemotherapy: Secondary | ICD-10-CM | POA: Diagnosis not present

## 2021-12-06 DIAGNOSIS — Z9884 Bariatric surgery status: Secondary | ICD-10-CM | POA: Diagnosis not present

## 2021-12-06 DIAGNOSIS — Z7984 Long term (current) use of oral hypoglycemic drugs: Secondary | ICD-10-CM | POA: Diagnosis not present

## 2021-12-06 LAB — CBC WITH DIFFERENTIAL (CANCER CENTER ONLY)
Abs Immature Granulocytes: 0.04 10*3/uL (ref 0.00–0.07)
Basophils Absolute: 0.1 10*3/uL (ref 0.0–0.1)
Basophils Relative: 1 %
Eosinophils Absolute: 0 10*3/uL (ref 0.0–0.5)
Eosinophils Relative: 0 %
HCT: 27.8 % — ABNORMAL LOW (ref 36.0–46.0)
Hemoglobin: 8.9 g/dL — ABNORMAL LOW (ref 12.0–15.0)
Immature Granulocytes: 1 %
Lymphocytes Relative: 11 %
Lymphs Abs: 0.6 10*3/uL — ABNORMAL LOW (ref 0.7–4.0)
MCH: 26.6 pg (ref 26.0–34.0)
MCHC: 32 g/dL (ref 30.0–36.0)
MCV: 83 fL (ref 80.0–100.0)
Monocytes Absolute: 1.1 10*3/uL — ABNORMAL HIGH (ref 0.1–1.0)
Monocytes Relative: 21 %
Neutro Abs: 3.7 10*3/uL (ref 1.7–7.7)
Neutrophils Relative %: 66 %
Platelet Count: 185 10*3/uL (ref 150–400)
RBC: 3.35 MIL/uL — ABNORMAL LOW (ref 3.87–5.11)
RDW: 20.6 % — ABNORMAL HIGH (ref 11.5–15.5)
WBC Count: 5.5 10*3/uL (ref 4.0–10.5)
nRBC: 0 % (ref 0.0–0.2)

## 2021-12-06 LAB — SAMPLE TO BLOOD BANK

## 2021-12-06 MED ORDER — PALONOSETRON HCL INJECTION 0.25 MG/5ML
0.2500 mg | Freq: Once | INTRAVENOUS | Status: AC
  Administered 2021-12-06: 0.25 mg via INTRAVENOUS
  Filled 2021-12-06: qty 5

## 2021-12-06 MED ORDER — SODIUM CHLORIDE 0.9 % IV SOLN
75.0000 mg/m2 | Freq: Once | INTRAVENOUS | Status: AC
  Administered 2021-12-06: 135 mg via INTRAVENOUS
  Filled 2021-12-06: qty 13.5

## 2021-12-06 MED ORDER — SODIUM CHLORIDE 0.9 % IV SOLN: Freq: Once | INTRAVENOUS | Status: AC

## 2021-12-06 MED ORDER — HEPARIN SOD (PORK) LOCK FLUSH 100 UNIT/ML IV SOLN
500.0000 [IU] | Freq: Once | INTRAVENOUS | Status: AC | PRN
  Administered 2021-12-06: 500 [IU]

## 2021-12-06 MED ORDER — SODIUM CHLORIDE 0.9 % IV SOLN
10.0000 mg | Freq: Once | INTRAVENOUS | Status: AC
  Administered 2021-12-06: 10 mg via INTRAVENOUS
  Filled 2021-12-06: qty 10

## 2021-12-06 MED ORDER — CYANOCOBALAMIN 1000 MCG/ML IJ SOLN
1000.0000 ug | Freq: Once | INTRAMUSCULAR | Status: AC
  Administered 2021-12-06: 1000 ug via INTRAMUSCULAR
  Filled 2021-12-06: qty 1

## 2021-12-06 MED ORDER — SODIUM CHLORIDE 0.9% FLUSH
10.0000 mL | INTRAVENOUS | Status: DC | PRN
  Administered 2021-12-06: 10 mL

## 2021-12-06 MED ORDER — ROMIPLOSTIM INJECTION 500 MCG
590.0000 ug | Freq: Once | SUBCUTANEOUS | Status: AC
  Administered 2021-12-06: 590 ug via SUBCUTANEOUS
  Filled 2021-12-06: qty 1

## 2021-12-06 MED ORDER — SODIUM CHLORIDE 0.9% FLUSH
10.0000 mL | Freq: Once | INTRAVENOUS | Status: AC
  Administered 2021-12-06: 10 mL

## 2021-12-06 MED ORDER — CYCLOBENZAPRINE HCL 5 MG PO TABS: 5.0000 mg | ORAL_TABLET | Freq: Three times a day (TID) | ORAL | 0 refills | Status: DC | PRN

## 2021-12-06 NOTE — Progress Notes (Signed)
Ok to treat with CMP results from [facility] last week per Dr Mosetta Putt

## 2021-12-07 ENCOUNTER — Telehealth: Payer: Self-pay | Admitting: Hematology

## 2021-12-07 ENCOUNTER — Inpatient Hospital Stay: Payer: Medicare HMO

## 2021-12-07 VITALS — BP 100/66 | HR 86 | Temp 97.9°F | Resp 18 | Wt 146.5 lb

## 2021-12-07 DIAGNOSIS — D693 Immune thrombocytopenic purpura: Secondary | ICD-10-CM | POA: Diagnosis not present

## 2021-12-07 DIAGNOSIS — I10 Essential (primary) hypertension: Secondary | ICD-10-CM | POA: Diagnosis not present

## 2021-12-07 DIAGNOSIS — E785 Hyperlipidemia, unspecified: Secondary | ICD-10-CM | POA: Diagnosis not present

## 2021-12-07 DIAGNOSIS — R69 Illness, unspecified: Secondary | ICD-10-CM | POA: Diagnosis not present

## 2021-12-07 DIAGNOSIS — Z7984 Long term (current) use of oral hypoglycemic drugs: Secondary | ICD-10-CM | POA: Diagnosis not present

## 2021-12-07 DIAGNOSIS — Z79899 Other long term (current) drug therapy: Secondary | ICD-10-CM | POA: Diagnosis not present

## 2021-12-07 DIAGNOSIS — E1142 Type 2 diabetes mellitus with diabetic polyneuropathy: Secondary | ICD-10-CM | POA: Diagnosis not present

## 2021-12-07 DIAGNOSIS — Z5111 Encounter for antineoplastic chemotherapy: Secondary | ICD-10-CM | POA: Diagnosis not present

## 2021-12-07 DIAGNOSIS — D510 Vitamin B12 deficiency anemia due to intrinsic factor deficiency: Secondary | ICD-10-CM | POA: Diagnosis not present

## 2021-12-07 DIAGNOSIS — C9310 Chronic myelomonocytic leukemia not having achieved remission: Secondary | ICD-10-CM

## 2021-12-07 DIAGNOSIS — Z9884 Bariatric surgery status: Secondary | ICD-10-CM | POA: Diagnosis not present

## 2021-12-07 MED ORDER — HEPARIN SOD (PORK) LOCK FLUSH 100 UNIT/ML IV SOLN
500.0000 [IU] | Freq: Once | INTRAVENOUS | Status: AC | PRN
  Administered 2021-12-07: 500 [IU]

## 2021-12-07 MED ORDER — SODIUM CHLORIDE 0.9 % IV SOLN
75.0000 mg/m2 | Freq: Once | INTRAVENOUS | Status: AC
  Administered 2021-12-07: 135 mg via INTRAVENOUS
  Filled 2021-12-07: qty 13.5

## 2021-12-07 MED ORDER — SODIUM CHLORIDE 0.9 % IV SOLN
10.0000 mg | Freq: Once | INTRAVENOUS | Status: AC
  Administered 2021-12-07: 10 mg via INTRAVENOUS
  Filled 2021-12-07: qty 10

## 2021-12-07 MED ORDER — SODIUM CHLORIDE 0.9 % IV SOLN: Freq: Once | INTRAVENOUS | Status: AC

## 2021-12-07 MED ORDER — SODIUM CHLORIDE 0.9% FLUSH
10.0000 mL | INTRAVENOUS | Status: DC | PRN
  Administered 2021-12-07: 10 mL

## 2021-12-07 MED FILL — Dexamethasone Sodium Phosphate Inj 100 MG/10ML: INTRAMUSCULAR | Qty: 1 | Status: AC

## 2021-12-08 ENCOUNTER — Inpatient Hospital Stay: Payer: Medicare HMO

## 2021-12-08 ENCOUNTER — Other Ambulatory Visit: Payer: Self-pay

## 2021-12-08 ENCOUNTER — Telehealth: Payer: Self-pay

## 2021-12-08 VITALS — BP 108/62 | HR 87 | Temp 98.2°F | Resp 18

## 2021-12-08 DIAGNOSIS — Z9884 Bariatric surgery status: Secondary | ICD-10-CM | POA: Diagnosis not present

## 2021-12-08 DIAGNOSIS — D693 Immune thrombocytopenic purpura: Secondary | ICD-10-CM | POA: Diagnosis not present

## 2021-12-08 DIAGNOSIS — D510 Vitamin B12 deficiency anemia due to intrinsic factor deficiency: Secondary | ICD-10-CM | POA: Diagnosis not present

## 2021-12-08 DIAGNOSIS — Z5111 Encounter for antineoplastic chemotherapy: Secondary | ICD-10-CM | POA: Diagnosis not present

## 2021-12-08 DIAGNOSIS — Z7984 Long term (current) use of oral hypoglycemic drugs: Secondary | ICD-10-CM | POA: Diagnosis not present

## 2021-12-08 DIAGNOSIS — E785 Hyperlipidemia, unspecified: Secondary | ICD-10-CM | POA: Diagnosis not present

## 2021-12-08 DIAGNOSIS — R69 Illness, unspecified: Secondary | ICD-10-CM | POA: Diagnosis not present

## 2021-12-08 DIAGNOSIS — C9310 Chronic myelomonocytic leukemia not having achieved remission: Secondary | ICD-10-CM

## 2021-12-08 DIAGNOSIS — Z79899 Other long term (current) drug therapy: Secondary | ICD-10-CM | POA: Diagnosis not present

## 2021-12-08 DIAGNOSIS — I10 Essential (primary) hypertension: Secondary | ICD-10-CM | POA: Diagnosis not present

## 2021-12-08 DIAGNOSIS — E1142 Type 2 diabetes mellitus with diabetic polyneuropathy: Secondary | ICD-10-CM | POA: Diagnosis not present

## 2021-12-08 MED ORDER — SODIUM CHLORIDE 0.9 % IV SOLN
10.0000 mg | Freq: Once | INTRAVENOUS | Status: AC
  Administered 2021-12-08: 10 mg via INTRAVENOUS
  Filled 2021-12-08: qty 10

## 2021-12-08 MED ORDER — HEPARIN SOD (PORK) LOCK FLUSH 100 UNIT/ML IV SOLN
500.0000 [IU] | Freq: Once | INTRAVENOUS | Status: AC | PRN
  Administered 2021-12-08: 500 [IU]

## 2021-12-08 MED ORDER — SODIUM CHLORIDE 0.9 % IV SOLN
75.0000 mg/m2 | Freq: Once | INTRAVENOUS | Status: AC
  Administered 2021-12-08: 135 mg via INTRAVENOUS
  Filled 2021-12-08: qty 13.5

## 2021-12-08 MED ORDER — SODIUM CHLORIDE 0.9% FLUSH
10.0000 mL | INTRAVENOUS | Status: DC | PRN
  Administered 2021-12-08: 10 mL

## 2021-12-08 MED ORDER — PALONOSETRON HCL INJECTION 0.25 MG/5ML
0.2500 mg | Freq: Once | INTRAVENOUS | Status: AC
  Administered 2021-12-08: 0.25 mg via INTRAVENOUS
  Filled 2021-12-08: qty 5

## 2021-12-08 MED ORDER — SODIUM CHLORIDE 0.9 % IV SOLN: Freq: Once | INTRAVENOUS | Status: AC

## 2021-12-08 MED FILL — Dexamethasone Sodium Phosphate Inj 100 MG/10ML: INTRAMUSCULAR | Qty: 1 | Status: AC

## 2021-12-08 NOTE — Telephone Encounter (Signed)
Faxed written order for CBC w/diff and Nplate SubQ Injection 1x/wkly for 3wks to [facility] [phone]) attention [facility]fusion attention.  Fax confirmation received.  Pt will be in [place] visiting her children for 3 wks.

## 2021-12-08 NOTE — Patient Instructions (Signed)
[facility]  Discharge Instructions: Thank you for choosing [facility] to provide your oncology and hematology care.   If you have a lab appointment with the Cancer Center, please go directly to the Cancer Center and check in at the registration area.   Wear comfortable clothing and clothing appropriate for easy access to any Portacath or PICC line.   We strive to give you quality time with your provider. You may need to reschedule your appointment if you arrive late (15 or more minutes).  Arriving late affects you and other patients whose appointments are after yours.  Also, if you miss three or more appointments without notifying the office, you may be dismissed from the clinic at the provider's discretion.      For prescription refill requests, have your pharmacy contact our office and allow 72 hours for refills to be completed.    Today you received the following chemotherapy and/or immunotherapy agents: Vidaza      To help prevent nausea and vomiting after your treatment, we encourage you to take your nausea medication as directed.  BELOW ARE SYMPTOMS THAT SHOULD BE REPORTED IMMEDIATELY: *FEVER GREATER THAN 100.4 F (38 C) OR HIGHER *CHILLS OR SWEATING *NAUSEA AND VOMITING THAT IS NOT CONTROLLED WITH YOUR NAUSEA MEDICATION *UNUSUAL SHORTNESS OF BREATH *UNUSUAL BRUISING OR BLEEDING *URINARY PROBLEMS (pain or burning when urinating, or frequent urination) *BOWEL PROBLEMS (unusual diarrhea, constipation, pain near the anus) TENDERNESS IN MOUTH AND THROAT WITH OR WITHOUT PRESENCE OF ULCERS (sore throat, sores in mouth, or a toothache) UNUSUAL RASH, SWELLING OR PAIN  UNUSUAL VAGINAL DISCHARGE OR ITCHING   Items with * indicate a potential emergency and should be followed up as soon as possible or go to the Emergency Department if any problems should occur.  Please show the CHEMOTHERAPY ALERT CARD or IMMUNOTHERAPY ALERT CARD at check-in to the  Emergency Department and triage nurse.  Should you have questions after your visit or need to cancel or reschedule your appointment, please contact [facility]  Dept: [phone]  and follow the prompts.  Office hours are 8:00 a.m. to 4:30 p.m. Monday - Friday. Please note that voicemails left after 4:00 p.m. may not be returned until the following business day.  We are closed weekends and major holidays. You have access to a nurse at all times for urgent questions. Please call the main number to the clinic Dept: [phone] and follow the prompts.   For any non-urgent questions, you may also contact your provider using MyChart. We now offer e-Visits for anyone 18 and older to request care online for non-urgent symptoms. For details visit [email or web address].   Also download the MyChart app! Go to the app store, search "MyChart", open the app, select , and log in with your MyChart username and password.  Masks are optional in the cancer centers. If you would like for your care team to wear a mask while they are taking care of you, please let them know. For doctor visits, patients may have with them one support Ara Mano who is at least 70 years old. At this time, visitors are not allowed in the infusion area. 

## 2021-12-09 ENCOUNTER — Inpatient Hospital Stay: Payer: Medicare HMO

## 2021-12-09 VITALS — BP 125/66 | HR 95 | Temp 98.4°F | Resp 17

## 2021-12-09 DIAGNOSIS — D510 Vitamin B12 deficiency anemia due to intrinsic factor deficiency: Secondary | ICD-10-CM | POA: Diagnosis not present

## 2021-12-09 DIAGNOSIS — Z9884 Bariatric surgery status: Secondary | ICD-10-CM | POA: Diagnosis not present

## 2021-12-09 DIAGNOSIS — C9310 Chronic myelomonocytic leukemia not having achieved remission: Secondary | ICD-10-CM

## 2021-12-09 DIAGNOSIS — R69 Illness, unspecified: Secondary | ICD-10-CM | POA: Diagnosis not present

## 2021-12-09 DIAGNOSIS — E785 Hyperlipidemia, unspecified: Secondary | ICD-10-CM | POA: Diagnosis not present

## 2021-12-09 DIAGNOSIS — E1142 Type 2 diabetes mellitus with diabetic polyneuropathy: Secondary | ICD-10-CM | POA: Diagnosis not present

## 2021-12-09 DIAGNOSIS — D693 Immune thrombocytopenic purpura: Secondary | ICD-10-CM | POA: Diagnosis not present

## 2021-12-09 DIAGNOSIS — I10 Essential (primary) hypertension: Secondary | ICD-10-CM | POA: Diagnosis not present

## 2021-12-09 DIAGNOSIS — Z79899 Other long term (current) drug therapy: Secondary | ICD-10-CM | POA: Diagnosis not present

## 2021-12-09 DIAGNOSIS — Z5111 Encounter for antineoplastic chemotherapy: Secondary | ICD-10-CM | POA: Diagnosis not present

## 2021-12-09 DIAGNOSIS — Z7984 Long term (current) use of oral hypoglycemic drugs: Secondary | ICD-10-CM | POA: Diagnosis not present

## 2021-12-09 MED ORDER — SODIUM CHLORIDE 0.9 % IV SOLN
75.0000 mg/m2 | Freq: Once | INTRAVENOUS | Status: AC
  Administered 2021-12-09: 135 mg via INTRAVENOUS
  Filled 2021-12-09: qty 13.5

## 2021-12-09 MED ORDER — SODIUM CHLORIDE 0.9 % IV SOLN: Freq: Once | INTRAVENOUS | Status: AC

## 2021-12-09 MED ORDER — SODIUM CHLORIDE 0.9 % IV SOLN
10.0000 mg | Freq: Once | INTRAVENOUS | Status: AC
  Administered 2021-12-09: 10 mg via INTRAVENOUS
  Filled 2021-12-09: qty 10

## 2021-12-09 MED ORDER — HEPARIN SOD (PORK) LOCK FLUSH 100 UNIT/ML IV SOLN
500.0000 [IU] | Freq: Once | INTRAVENOUS | Status: AC | PRN
  Administered 2021-12-09: 500 [IU]

## 2021-12-09 MED ORDER — SODIUM CHLORIDE 0.9% FLUSH
10.0000 mL | INTRAVENOUS | Status: DC | PRN
  Administered 2021-12-09: 10 mL

## 2021-12-09 MED FILL — Dexamethasone Sodium Phosphate Inj 100 MG/10ML: INTRAMUSCULAR | Qty: 1 | Status: AC

## 2021-12-09 NOTE — Progress Notes (Deleted)
Ok to treat with BP of 89/55. 500cc to be given over 30 minutes per Dr. Irene Limbo

## 2021-12-09 NOTE — Patient Instructions (Signed)
[facility]  Discharge Instructions: Thank you for choosing [facility] to provide your oncology and hematology care.   If you have a lab appointment with the Cancer Center, please go directly to the Cancer Center and check in at the registration area.   Wear comfortable clothing and clothing appropriate for easy access to any Portacath or PICC line.   We strive to give you quality time with your provider. You may need to reschedule your appointment if you arrive late (15 or more minutes).  Arriving late affects you and other patients whose appointments are after yours.  Also, if you miss three or more appointments without notifying the office, you may be dismissed from the clinic at the provider's discretion.      For prescription refill requests, have your pharmacy contact our office and allow 72 hours for refills to be completed.    Today you received the following chemotherapy and/or immunotherapy agents: Vidaza      To help prevent nausea and vomiting after your treatment, we encourage you to take your nausea medication as directed.  BELOW ARE SYMPTOMS THAT SHOULD BE REPORTED IMMEDIATELY: *FEVER GREATER THAN 100.4 F (38 C) OR HIGHER *CHILLS OR SWEATING *NAUSEA AND VOMITING THAT IS NOT CONTROLLED WITH YOUR NAUSEA MEDICATION *UNUSUAL SHORTNESS OF BREATH *UNUSUAL BRUISING OR BLEEDING *URINARY PROBLEMS (pain or burning when urinating, or frequent urination) *BOWEL PROBLEMS (unusual diarrhea, constipation, pain near the anus) TENDERNESS IN MOUTH AND THROAT WITH OR WITHOUT PRESENCE OF ULCERS (sore throat, sores in mouth, or a toothache) UNUSUAL RASH, SWELLING OR PAIN  UNUSUAL VAGINAL DISCHARGE OR ITCHING   Items with * indicate a potential emergency and should be followed up as soon as possible or go to the Emergency Department if any problems should occur.  Please show the CHEMOTHERAPY ALERT CARD or IMMUNOTHERAPY ALERT CARD at check-in to the  Emergency Department and triage nurse.  Should you have questions after your visit or need to cancel or reschedule your appointment, please contact [facility]  Dept: [phone]  and follow the prompts.  Office hours are 8:00 a.m. to 4:30 p.m. Monday - Friday. Please note that voicemails left after 4:00 p.m. may not be returned until the following business day.  We are closed weekends and major holidays. You have access to a nurse at all times for urgent questions. Please call the main number to the clinic Dept: [phone] and follow the prompts.   For any non-urgent questions, you may also contact your provider using MyChart. We now offer e-Visits for anyone 18 and older to request care online for non-urgent symptoms. For details visit [email or web address].   Also download the MyChart app! Go to the app store, search "MyChart", open the app, select [place], and log in with your MyChart username and password.  Masks are optional in the cancer centers. If you would like for your care team to wear a mask while they are taking care of you, please let them know. For doctor visits, patients may have with them one support person who is at least 70 years old. At this time, visitors are not allowed in the infusion area. 

## 2021-12-10 ENCOUNTER — Inpatient Hospital Stay: Payer: Medicare HMO

## 2021-12-10 ENCOUNTER — Other Ambulatory Visit: Payer: Self-pay

## 2021-12-10 VITALS — BP 144/75 | HR 98 | Temp 97.8°F | Resp 16

## 2021-12-10 DIAGNOSIS — Z9884 Bariatric surgery status: Secondary | ICD-10-CM | POA: Diagnosis not present

## 2021-12-10 DIAGNOSIS — C9310 Chronic myelomonocytic leukemia not having achieved remission: Secondary | ICD-10-CM | POA: Diagnosis not present

## 2021-12-10 DIAGNOSIS — E785 Hyperlipidemia, unspecified: Secondary | ICD-10-CM | POA: Diagnosis not present

## 2021-12-10 DIAGNOSIS — R69 Illness, unspecified: Secondary | ICD-10-CM | POA: Diagnosis not present

## 2021-12-10 DIAGNOSIS — Z7984 Long term (current) use of oral hypoglycemic drugs: Secondary | ICD-10-CM | POA: Diagnosis not present

## 2021-12-10 DIAGNOSIS — Z5111 Encounter for antineoplastic chemotherapy: Secondary | ICD-10-CM | POA: Diagnosis not present

## 2021-12-10 DIAGNOSIS — I10 Essential (primary) hypertension: Secondary | ICD-10-CM | POA: Diagnosis not present

## 2021-12-10 DIAGNOSIS — D510 Vitamin B12 deficiency anemia due to intrinsic factor deficiency: Secondary | ICD-10-CM | POA: Diagnosis not present

## 2021-12-10 DIAGNOSIS — D693 Immune thrombocytopenic purpura: Secondary | ICD-10-CM | POA: Diagnosis not present

## 2021-12-10 DIAGNOSIS — Z79899 Other long term (current) drug therapy: Secondary | ICD-10-CM | POA: Diagnosis not present

## 2021-12-10 DIAGNOSIS — E1142 Type 2 diabetes mellitus with diabetic polyneuropathy: Secondary | ICD-10-CM | POA: Diagnosis not present

## 2021-12-10 MED ORDER — PALONOSETRON HCL INJECTION 0.25 MG/5ML
0.2500 mg | Freq: Once | INTRAVENOUS | Status: AC
  Administered 2021-12-10: 0.25 mg via INTRAVENOUS
  Filled 2021-12-10: qty 5

## 2021-12-10 MED ORDER — SODIUM CHLORIDE 0.9 % IV SOLN
75.0000 mg/m2 | Freq: Once | INTRAVENOUS | Status: AC
  Administered 2021-12-10: 135 mg via INTRAVENOUS
  Filled 2021-12-10: qty 13.5

## 2021-12-10 MED ORDER — SODIUM CHLORIDE 0.9 % IV SOLN: Freq: Once | INTRAVENOUS | Status: AC

## 2021-12-10 MED ORDER — HEPARIN SOD (PORK) LOCK FLUSH 100 UNIT/ML IV SOLN
500.0000 [IU] | Freq: Once | INTRAVENOUS | Status: AC | PRN
  Administered 2021-12-10: 500 [IU]

## 2021-12-10 MED ORDER — SODIUM CHLORIDE 0.9 % IV SOLN
10.0000 mg | Freq: Once | INTRAVENOUS | Status: AC
  Administered 2021-12-10: 10 mg via INTRAVENOUS
  Filled 2021-12-10: qty 10

## 2021-12-10 MED ORDER — SODIUM CHLORIDE 0.9% FLUSH
10.0000 mL | INTRAVENOUS | Status: DC | PRN
  Administered 2021-12-10: 10 mL

## 2021-12-10 NOTE — Patient Instructions (Addendum)
[facility]   Discharge Instructions: Thank you for choosing [facility] to provide your oncology and hematology care.   If you have a lab appointment with the [facility], please go directly to the [facility] and check in at the registration area.   Wear comfortable clothing and clothing appropriate for easy access to any Portacath or PICC line.   We strive to give you quality time with your provider. You may need to reschedule your appointment if you arrive late (15 or more minutes).  Arriving late affects you and other patients whose appointments are after yours.  Also, if you miss three or more appointments without notifying the office, you may be dismissed from the clinic at the provider's discretion.      For prescription refill requests, have your pharmacy contact our office and allow 72 hours for refills to be completed.    Today you received the following chemotherapy and/or immunotherapy agents: azacitidine      To help prevent nausea and vomiting after your treatment, we encourage you to take your nausea medication as directed.  BELOW ARE SYMPTOMS THAT SHOULD BE REPORTED IMMEDIATELY: *FEVER GREATER THAN 100.4 F (38 C) OR HIGHER *CHILLS OR SWEATING *NAUSEA AND VOMITING THAT IS NOT CONTROLLED WITH YOUR NAUSEA MEDICATION *UNUSUAL SHORTNESS OF BREATH *UNUSUAL BRUISING OR BLEEDING *URINARY PROBLEMS (pain or burning when urinating, or frequent urination) *BOWEL PROBLEMS (unusual diarrhea, constipation, pain near the anus) TENDERNESS IN MOUTH AND THROAT WITH OR WITHOUT PRESENCE OF ULCERS (sore throat, sores in mouth, or a toothache) UNUSUAL RASH, SWELLING OR PAIN  UNUSUAL VAGINAL DISCHARGE OR ITCHING   Items with * indicate a potential emergency and should be followed up as soon as possible or go to the Emergency Department if any problems should occur.  Please show the CHEMOTHERAPY ALERT CARD or IMMUNOTHERAPY ALERT CARD at check-in  to the Emergency Department and triage nurse.  Should you have questions after your visit or need to cancel or reschedule your appointment, please contact [facility]  Dept: [phone]  and follow the prompts.  Office hours are 8:00 a.m. to 4:30 p.m. Monday - Friday. Please note that voicemails left after 4:00 p.m. may not be returned until the following business day.  We are closed weekends and major holidays. You have access to a nurse at all times for urgent questions. Please call the main number to the clinic Dept: [phone] and follow the prompts.   For any non-urgent questions, you may also contact your provider using MyChart. We now offer e-Visits for anyone 61 and older to request care online for non-urgent symptoms. For details visit [email or web address].   Also download the MyChart app! Go to the app store, search "MyChart", open the app, select [place], and log in with your MyChart username and password.  Masks are optional in the cancer centers. If you would like for your care team to wear a mask while they are taking care of you, please let them know. For doctor visits, patients may have with them one support person who is at least 70 years old. At this time, visitors are not allowed in the infusion area.

## 2021-12-13 DIAGNOSIS — E1169 Type 2 diabetes mellitus with other specified complication: Secondary | ICD-10-CM | POA: Diagnosis not present

## 2021-12-13 DIAGNOSIS — R768 Other specified abnormal immunological findings in serum: Secondary | ICD-10-CM | POA: Diagnosis not present

## 2021-12-13 DIAGNOSIS — D693 Immune thrombocytopenic purpura: Secondary | ICD-10-CM | POA: Diagnosis not present

## 2021-12-13 DIAGNOSIS — C9310 Chronic myelomonocytic leukemia not having achieved remission: Secondary | ICD-10-CM | POA: Diagnosis not present

## 2021-12-21 DIAGNOSIS — C9310 Chronic myelomonocytic leukemia not having achieved remission: Secondary | ICD-10-CM | POA: Diagnosis not present

## 2021-12-21 DIAGNOSIS — D693 Immune thrombocytopenic purpura: Secondary | ICD-10-CM | POA: Diagnosis not present

## 2021-12-27 DIAGNOSIS — D693 Immune thrombocytopenic purpura: Secondary | ICD-10-CM | POA: Diagnosis not present

## 2021-12-27 DIAGNOSIS — C9310 Chronic myelomonocytic leukemia not having achieved remission: Secondary | ICD-10-CM | POA: Diagnosis not present

## 2021-12-31 MED FILL — Dexamethasone Sodium Phosphate Inj 100 MG/10ML: INTRAMUSCULAR | Qty: 1 | Status: AC

## 2022-01-03 ENCOUNTER — Inpatient Hospital Stay: Payer: Medicare HMO | Attending: Hematology | Admitting: Hematology

## 2022-01-03 ENCOUNTER — Other Ambulatory Visit: Payer: Self-pay

## 2022-01-03 ENCOUNTER — Inpatient Hospital Stay: Payer: Medicare HMO

## 2022-01-03 ENCOUNTER — Encounter: Payer: Self-pay | Admitting: Hematology

## 2022-01-03 VITALS — BP 122/61 | HR 89 | Temp 98.1°F | Resp 15 | Wt 148.1 lb

## 2022-01-03 VITALS — BP 114/63 | HR 88 | Temp 98.4°F | Resp 16

## 2022-01-03 DIAGNOSIS — Z5111 Encounter for antineoplastic chemotherapy: Secondary | ICD-10-CM | POA: Insufficient documentation

## 2022-01-03 DIAGNOSIS — C9310 Chronic myelomonocytic leukemia not having achieved remission: Secondary | ICD-10-CM

## 2022-01-03 DIAGNOSIS — Z95828 Presence of other vascular implants and grafts: Secondary | ICD-10-CM

## 2022-01-03 DIAGNOSIS — D693 Immune thrombocytopenic purpura: Secondary | ICD-10-CM | POA: Insufficient documentation

## 2022-01-03 DIAGNOSIS — Z7952 Long term (current) use of systemic steroids: Secondary | ICD-10-CM | POA: Insufficient documentation

## 2022-01-03 DIAGNOSIS — E538 Deficiency of other specified B group vitamins: Secondary | ICD-10-CM | POA: Diagnosis not present

## 2022-01-03 DIAGNOSIS — D696 Thrombocytopenia, unspecified: Secondary | ICD-10-CM

## 2022-01-03 DIAGNOSIS — Z79899 Other long term (current) drug therapy: Secondary | ICD-10-CM | POA: Diagnosis not present

## 2022-01-03 DIAGNOSIS — I10 Essential (primary) hypertension: Secondary | ICD-10-CM | POA: Insufficient documentation

## 2022-01-03 DIAGNOSIS — F410 Panic disorder [episodic paroxysmal anxiety] without agoraphobia: Secondary | ICD-10-CM | POA: Insufficient documentation

## 2022-01-03 DIAGNOSIS — Z9884 Bariatric surgery status: Secondary | ICD-10-CM | POA: Insufficient documentation

## 2022-01-03 DIAGNOSIS — E785 Hyperlipidemia, unspecified: Secondary | ICD-10-CM | POA: Insufficient documentation

## 2022-01-03 DIAGNOSIS — Z7984 Long term (current) use of oral hypoglycemic drugs: Secondary | ICD-10-CM | POA: Insufficient documentation

## 2022-01-03 DIAGNOSIS — E1142 Type 2 diabetes mellitus with diabetic polyneuropathy: Secondary | ICD-10-CM | POA: Diagnosis not present

## 2022-01-03 DIAGNOSIS — R69 Illness, unspecified: Secondary | ICD-10-CM | POA: Diagnosis not present

## 2022-01-03 LAB — COMPREHENSIVE METABOLIC PANEL
ALT: 16 U/L (ref 0–44)
AST: 18 U/L (ref 15–41)
Albumin: 3.3 g/dL — ABNORMAL LOW (ref 3.5–5.0)
Alkaline Phosphatase: 116 U/L (ref 38–126)
Anion gap: 5 (ref 5–15)
BUN: 13 mg/dL (ref 8–23)
CO2: 26 mmol/L (ref 22–32)
Calcium: 8.5 mg/dL — ABNORMAL LOW (ref 8.9–10.3)
Chloride: 108 mmol/L (ref 98–111)
Creatinine, Ser: 0.62 mg/dL (ref 0.44–1.00)
GFR, Estimated: >60 mL/min (ref >60)
Glucose, Bld: 207 mg/dL — ABNORMAL HIGH (ref 70–99)
Potassium: 4.1 mmol/L (ref 3.5–5.1)
Sodium: 139 mmol/L (ref 135–145)
Total Bilirubin: 0.7 mg/dL (ref 0.3–1.2)
Total Protein: 6.2 g/dL — ABNORMAL LOW (ref 6.5–8.1)

## 2022-01-03 LAB — CBC WITH DIFFERENTIAL (CANCER CENTER ONLY)
Abs Immature Granulocytes: 0.03 10*3/uL (ref 0.00–0.07)
Basophils Absolute: 0 10*3/uL (ref 0.0–0.1)
Basophils Relative: 1 %
Eosinophils Absolute: 0 10*3/uL (ref 0.0–0.5)
Eosinophils Relative: 1 %
HCT: 27.1 % — ABNORMAL LOW (ref 36.0–46.0)
Hemoglobin: 8.5 g/dL — ABNORMAL LOW (ref 12.0–15.0)
Immature Granulocytes: 1 %
Lymphocytes Relative: 13 %
Lymphs Abs: 0.6 10*3/uL — ABNORMAL LOW (ref 0.7–4.0)
MCH: 25.7 pg — ABNORMAL LOW (ref 26.0–34.0)
MCHC: 31.4 g/dL (ref 30.0–36.0)
MCV: 81.9 fL (ref 80.0–100.0)
Monocytes Absolute: 0.6 10*3/uL (ref 0.1–1.0)
Monocytes Relative: 15 %
Neutro Abs: 2.9 10*3/uL (ref 1.7–7.7)
Neutrophils Relative %: 69 %
Platelet Count: 184 10*3/uL (ref 150–400)
RBC: 3.31 MIL/uL — ABNORMAL LOW (ref 3.87–5.11)
RDW: 19 % — ABNORMAL HIGH (ref 11.5–15.5)
WBC Count: 4.2 10*3/uL (ref 4.0–10.5)
nRBC: 0 % (ref 0.0–0.2)

## 2022-01-03 LAB — VITAMIN B12: Vitamin B-12: 1356 pg/mL — ABNORMAL HIGH (ref 180–914)

## 2022-01-03 LAB — SAMPLE TO BLOOD BANK

## 2022-01-03 MED ORDER — POTASSIUM CHLORIDE CRYS ER 10 MEQ PO TBCR: 10.0000 meq | EXTENDED_RELEASE_TABLET | Freq: Two times a day (BID) | ORAL | 0 refills | Status: DC

## 2022-01-03 MED ORDER — FUROSEMIDE 20 MG PO TABS: 20.0000 mg | ORAL_TABLET | Freq: Every day | ORAL | 0 refills | Status: DC

## 2022-01-03 MED ORDER — SODIUM CHLORIDE 0.9 % IV SOLN
75.0000 mg/m2 | Freq: Once | INTRAVENOUS | Status: AC
  Administered 2022-01-03: 135 mg via INTRAVENOUS
  Filled 2022-01-03: qty 13.5

## 2022-01-03 MED ORDER — SODIUM CHLORIDE 0.9% FLUSH
10.0000 mL | Freq: Once | INTRAVENOUS | Status: AC
  Administered 2022-01-03: 10 mL

## 2022-01-03 MED ORDER — SODIUM CHLORIDE 0.9% FLUSH
10.0000 mL | INTRAVENOUS | Status: DC | PRN
  Administered 2022-01-03: 10 mL

## 2022-01-03 MED ORDER — ROMIPLOSTIM INJECTION 500 MCG
590.0000 ug | Freq: Once | SUBCUTANEOUS | Status: AC
  Administered 2022-01-03: 590 ug via SUBCUTANEOUS
  Filled 2022-01-03: qty 0.5

## 2022-01-03 MED ORDER — PALONOSETRON HCL INJECTION 0.25 MG/5ML
0.2500 mg | Freq: Once | INTRAVENOUS | Status: AC
  Administered 2022-01-03: 0.25 mg via INTRAVENOUS
  Filled 2022-01-03: qty 5

## 2022-01-03 MED ORDER — CYANOCOBALAMIN 1000 MCG/ML IJ SOLN
1000.0000 ug | Freq: Once | INTRAMUSCULAR | Status: AC
  Administered 2022-01-03: 1000 ug via INTRAMUSCULAR
  Filled 2022-01-03: qty 1

## 2022-01-03 MED ORDER — HEPARIN SOD (PORK) LOCK FLUSH 100 UNIT/ML IV SOLN
500.0000 [IU] | Freq: Once | INTRAVENOUS | Status: AC | PRN
  Administered 2022-01-03: 500 [IU]

## 2022-01-03 MED ORDER — SODIUM CHLORIDE 0.9 % IV SOLN: Freq: Once | INTRAVENOUS | Status: AC

## 2022-01-03 MED ORDER — SODIUM CHLORIDE 0.9 % IV SOLN
10.0000 mg | Freq: Once | INTRAVENOUS | Status: AC
  Administered 2022-01-03: 10 mg via INTRAVENOUS
  Filled 2022-01-03: qty 10

## 2022-01-03 MED FILL — Dexamethasone Sodium Phosphate Inj 100 MG/10ML: INTRAMUSCULAR | Qty: 1 | Status: AC

## 2022-01-03 NOTE — Patient Instructions (Signed)
[facility]  Discharge Instructions: Thank you for choosing [facility] to provide your oncology and hematology care.   If you have a lab appointment with the Cancer Center, please go directly to the Cancer Center and check in at the registration area.   Wear comfortable clothing and clothing appropriate for easy access to any Portacath or PICC line.   We strive to give you quality time with your provider. You may need to reschedule your appointment if you arrive late (15 or more minutes).  Arriving late affects you and other patients whose appointments are after yours.  Also, if you miss three or more appointments without notifying the office, you may be dismissed from the clinic at the provider's discretion.      For prescription refill requests, have your pharmacy contact our office and allow 72 hours for refills to be completed.    Today you received the following chemotherapy and/or immunotherapy agents: Vidaza      To help prevent nausea and vomiting after your treatment, we encourage you to take your nausea medication as directed.  BELOW ARE SYMPTOMS THAT SHOULD BE REPORTED IMMEDIATELY: *FEVER GREATER THAN 100.4 F (38 C) OR HIGHER *CHILLS OR SWEATING *NAUSEA AND VOMITING THAT IS NOT CONTROLLED WITH YOUR NAUSEA MEDICATION *UNUSUAL SHORTNESS OF BREATH *UNUSUAL BRUISING OR BLEEDING *URINARY PROBLEMS (pain or burning when urinating, or frequent urination) *BOWEL PROBLEMS (unusual diarrhea, constipation, pain near the anus) TENDERNESS IN MOUTH AND THROAT WITH OR WITHOUT PRESENCE OF ULCERS (sore throat, sores in mouth, or a toothache) UNUSUAL RASH, SWELLING OR PAIN  UNUSUAL VAGINAL DISCHARGE OR ITCHING   Items with * indicate a potential emergency and should be followed up as soon as possible or go to the Emergency Department if any problems should occur.  Please show the CHEMOTHERAPY ALERT CARD or IMMUNOTHERAPY ALERT CARD at check-in to the  Emergency Department and triage nurse.  Should you have questions after your visit or need to cancel or reschedule your appointment, please contact [facility]  Dept: [phone]  and follow the prompts.  Office hours are 8:00 a.m. to 4:30 p.m. Monday - Friday. Please note that voicemails left after 4:00 p.m. may not be returned until the following business day.  We are closed weekends and major holidays. You have access to a nurse at all times for urgent questions. Please call the main number to the clinic Dept: [phone] and follow the prompts.   For any non-urgent questions, you may also contact your provider using MyChart. We now offer e-Visits for anyone 18 and older to request care online for non-urgent symptoms. For details visit [email or web address].   Also download the MyChart app! Go to the app store, search "MyChart", open the app, select [facility], and log in with your MyChart username and password.  Masks are optional in the cancer centers. If you would like for your care team to wear a mask while they are taking care of you, please let them know. For doctor visits, patients may have with them one support person who is at least 70 years old. At this time, visitors are not allowed in the infusion area. 

## 2022-01-03 NOTE — Progress Notes (Signed)
[facility]   Telephone:(336) [phone] Fax:(336) [phone]   Clinic Follow up Note   Patient Care Team: [name], DO as PCP - General (Family Medicine) Loletha Carrow, Kirke Corin, MD as Consulting Physician (Gastroenterology) Truitt Merle, MD as Consulting Physician (Hematology) Alda Berthold, DO as Consulting Physician (Neurology)  Date of Service:  01/03/2022  CHIEF COMPLAINT: f/u of [name], severe thrombocytopenia  CURRENT THERAPY:  Azacitidine days 1-5 q28 days, started 12/28/20 Nplate 45mg/kg weekly, dose reduced since 07/19/21 when plt normalized  Platelet transfusion as needed (plt<15K), blood transfusion if Hg<8.0  ASSESSMENT & PLAN:  Kathleen SISLERis a 71y.o. female with   1. [name] with severe thrombocytopenia  -Initially diagnosed with ITP in 2014. She lost follow up after 11/2017 and did not proceed with recommended bone marrow biopsy. -She was referred to ED 10/28/20 by her PCP for low plt count of 4k. She was hospitalized and treated with platelet transfusions, IVIG, oral prednisone 682mand Nplate injections. She tapered off prednisone given little response. -Bone marrow biopsy 10/30/20 felt to likely represent MDS/MPN, particularly [name].  [facility] Pa[facility]us OrnShe began treatment for severe refractory thrombocytopenia with weekly Rituxan x4 on 11/19/20, but she did not respond  -She began azacitidine daily days 1-5 q. 28 days on 12/28/20 -She had worsening thrombocytopenia after stopping Nplate, restarted weekly Nplate 10 mcg/kg on 12/15/54/25Bone marrow biopsy 03/18/21 showed stable disease, no increase in blasts. -her platelet count has fluctuated and was high enough for kyphoplasty procedure on 05/31/21.  -she last saw Dr. PaLinus Ornn 12/02/21. They will hold on BMB given her good hematological response to chemo.  -labs reviewed, plt 184k today, Hg 8.5 today. She will proceed with Vidaza today and daily for the rest of this  week. -we discussed lower the dose or holding the Nplate given the good response to chemo in her platelet count. I recommend lowering the dose today, then monitoring her with weekly labs.   2.  Mild LE Edema -she reports some new ankle swelling at night, which overall resolves by morning. She notes her ankles will get tight and sore by the end of the day. -I will call in furosemide for her to use this week then as needed.   3. Normocytic anemia, moderate  -H/o pernicious anemia/vitamin B12 deficiency -Transfuse for Hgb </= 7.5 -Continue oral iron and oral B12. -she was previously getting B12 injections due to prior gastric bypass surgery, last 12/06/21.  4. Thoracic spine compression fractures -T10 and T12 kyphoplasty performed 05/31/21 -she reports improvement in pain and no longer uses thoracic brace. -will hold on Zometa for now until she sees dentist  -I previously advised her to restart calcium and vit D.   5. Comorbidities: DM type 2 with polyneuropathy, HTN, h/o panic attack, headaches -Continue medication, follow-up PCP -Etiology of headaches unknown, managed with Tylenol -panic attack occurred once, managed with family support and lorazepam.     PLAN: -proceed with Nplate at lower dose and azacitidine today. -continue azacitidine daily for the rest of this week as scheduled  -I called in lasix and KCL -labs weekly x4, hold Nplate after today's injection and watch CBC  -next cycle Vidaza infusion daily X5, starting in 4 weeks -F/u in 4 weeks   No problem-specific Assessment & Plan notes found for this encounter.   SUMMARY OF ONCOLOGIC HISTORY: Oncology History  [name] ([facility]ocytic [place]) [place]  CT CAP  IMPRESSION: 1. Splenomegaly with pathologically enlarged lymph nodes above and below the diaphragm, with overall stable to minimally increased abdominal adenopathy and interval progression of the pelvic adenopathy. 2. Small volume  abdominopelvic ascites with diffuse mesenteric stranding. 3. Scattered bilateral pulmonary micro nodules measuring 1-2 mm. 4. Distended gallbladder with some layering hyperdense material representing layering sludge and tiny stones seen on prior ultrasound. 5. Aortic atherosclerosis.   10/30/2020 Pathology Results   DIAGNOSIS:   BONE MARROW, ASPIRATE, CLOT, CORE:  -  Hypercellular bone marrow with panhyperplasia, atypia, and no  increase in blasts  -  See comment   PERIPHERAL BLOOD:  -  Marked thrombocytopenia  -  Absolute monocytosis  -  Normocytic anemia  -  See CBC data and comment   COMMENT:  The bone marrow is hypercellular for age (approximately 80%) with myeloid hyperplasia with maturational left shift, erythroid hyperplasia, and increased megakaryocytes.  Mild multilineage atypia is present. Blasts are not increased on aspirate smears (1% by manual differential count) or by CD34 immunohistochemistry on the core biopsy.  Concurrent flow cytometric analysis of the bone marrow aspirate demonstrates increased monocytes, and no increase in blasts or abnormal lymphoid population (see [id number]).  Monocytes are also increased in peripheral  blood, persistent per electronic medical record.  In aggregate, the  findings raise the possibility of a myeloid neoplasm with the  differential diagnosis including a low-grade myelodysplastic syndrome and chronic myelomonocytic leukemia (dysplastic type).    ADDENDUM:  A reticulin special stain performed on the bone marrow core biopsy reveals no significant increase in reticulin fibrosis.  ADDENDUM:  CYTOGENETIC RESULTS:  Karyotype: 46,XX[20]  Interpretation: NORMAL FEMALE KARYOTYPE   FISH RESULTS:  Results: NORMAL   ADDENDUM:  CD123 immunohistochemistry performed on the core biopsy highlights scattered aggregates of positively staining cells consistent with plasmacytoid dendritic cells.    10/30/2020 Pathology Results   DIAGNOSIS:    BONE MARROW; FLOW CYTOMETRIC ANALYSIS:  -  Increased monocytes  -  Scant B-cells present  -  No immunophenotypically aberrant T-cell population identified  -  No increase in blasts  -  See comment   COMMENT:  Monocytes are relatively increased (12% of all cells), without aberrant expression of CD56.  B-cells comprise <1% of total lymphocytes. CD34-positive blasts are not increased (<1% of all cells).  Correlation with concurrent morphology is recommended for complete diagnostic interpretation and overall blast enumeration (see [id number]).    10/30/2020 Pathology Results   FINAL MICROSCOPIC DIAGNOSIS:   A. LYMPH NODE, RIGHT AXILLARY, NEEDLE CORE BIOPSY:  -Lymphoid tissue present  -See comment   COMMENT:  The sections show several small needle core biopsy fragments of lymphoid tissue displaying degenerative cellular changes/necrosis and hence cannot be accurately evaluated.  Sample for flow cytometric analysis not available.  Immunohistochemical stain for CD3 and CD20 were performed with appropriate controls.  There is a mixture of T and B cells in their apparently respective compartments.  There is no definite metastatic malignancy.    11/11/2020 Pathology Results   DIAGNOSIS:   LEFT AXILLARY LYMPH NODE EXCISIONAL BIOPSY; FLOW CYTOMETRIC ANALYSIS:  -  No monotypic B-cell or immunophenotypically aberrant T-cell  population identified  -  See comment   COMMENT:  Flow cytometric analysis identifies B-cells with a normal kappa:lambda ratio of 1.7:1.  A subset of the polytypic B-cells expresses CD10. T-cells show a CD4:CD8 ratio of 3.3:1 without immunophenotypic aberrancy with the markers evaluated.  Although these results do not support the diagnosis of a  clonal lymphoid population, sampling issues must always be considered when negative results are obtained, as focal lesions may not be represented in the specimen submitted.  In addition, flow cytometric immunophenotyping will not exclude  other pathology if present (e.g. Hodgkin lymphoma, some T-cell lymphomas, metastatic and infectious diseases).   11/11/2020 Pathology Results   FINAL MICROSCOPIC DIAGNOSIS:   A. LYMPH NODE, LEFT AXILLARY, DISSECTION:  -  Follicular hyperplasia with interfollicular expansion  -  See comment   COMMENT:  Sections of the lymph nodes reveal generally preserved lymph node architecture.  There is follicular hyperplasia with some follicles showing increased hyalinization of germinal centers and mild concentric encircling of germinal centers with small lymphocytes (onion skinning). Interfollicular areas are expanded with increased vascular proliferation, small lymphocytes, plasma cells, histiocytic cells, and patchy neutrophils.  The lymph node capsule is variably thickened by fibrosis with few plasma cells.   A panel of immunohistochemical stains is performed for further  characterization.  CD3 and CD20/PAX5 highlight T-cell and B-cell  compartments, respectively.  Germinal centers are highlighted by CD10 and BCL6 with appropriate absent expression of BCL2.  CD5 is similar to CD3.  CD43 also stains the T-cells.  CD4-positive T-cells exceed CD8-positive T-cells.  CD30 highlights scattered immunoblasts.  CD21 reveals intact follicular dendritic cell meshworks.  CD68 highlights increased histiocytic cells.  HHV 8 is negative.  T. pallidum reveals no definitive organisms.  Pancytokeratin is negative.  TdT stains rare cells.  CD138 highlights plasma cells which are not increased in number and show polytypic light chain expression by kappa/lambda in situ  hybridization.  EBV is negative by in situ hybridization.   Concurrent flow cytometric analysis is negative for a monoclonal B-cell or immunophenotypically aberrant T-cell population (see [id number]).   Together, the findings above are non-specific and can be seen in  reactive and infectious processes as well as Castleman disease.  There is no definitive  morphologic or flow cytometric evidence of involvement by a lymphoproliferative disorder from the current workup.   12/17/2020 Initial Diagnosis   [name] (chronic myelomonocytic leukemia) ([place])   12/28/2020 -  Chemotherapy   Patient is on Treatment Plan : MYELODYSPLASIA  Azacitidine IV D1-5 q28d     03/18/2021 Pathology Results   Final Diagnosis    BONE MARROW:             Persistent chronic myelomonocytic leukemia in a hypercellular bone marrow with increased monocytes and no increase in blasts.   Comment    Flow cytometric analysis showed no increase in blasts and 17% monocytes.A next generation myeloid panel showed the presence of the following mutations: NRAS, SH2B3, PHF6 and TET2. CD34 and CD117 immunstains do not show an increase in blasts. The findings are consistent with persistent [name] with a decrease in the number of blasts compared to that seen on the previous bone marrow.    Final Interpretation      BONE MARROW; FLOW CYTOMETRIC ANALYSIS:   No increased blasts identified. (see comment)  Monocytosis (17% of total events).    COMMENT: Flow cytometry identified about 0.6% of total events as immature cells. These cells express CD45 (dim), CD34, CD117, HLA-DR, CD38, CD33 (variable). This immunophenotype is consistent with normal myeloblasts. In addition, monocytes are increased and comprise of approximately 17% of total events. These monocytes show normal expression of CD33/CD64/CD14/CD11b with variable expression of CD4. Correlation with concurrent morphology and clinical data is recommended (see [id number]).    FLOW CYTOMETRY ANALYSIS: CD45 versus side scatter analysis demonstrate a predominance  of granulocytes (~70%), monocytes (~17%) and lymphocytes (~6%). No significant increase in blasts identified. All tested markers were used for adequate analysis of the cells and were appropriately reviewed.        INTERVAL HISTORY:  Kathleen Gibson is here for a follow up of [name]. She was  last seen by me on 12/06/21. She presents to the clinic alone. She reports she is doing well and enjoyed her recent trip to [place]. She notes she just returned yesterday. She reports her energy is good, but she "wears myself out" while in [place]. She tells me she plans to stay home this month to rest. She reports some recent ankle swelling, more so at night. She notes it gets sore at night but improves overnight.   All other systems were reviewed with the patient and are negative.  MEDICAL HISTORY:  Past Medical History:  Diagnosis Date   Diabetes ([place])    HLD (hyperlipidemia)    Hypertension    Pernicious anemia    Pneumonia 09/05/2019   Renal insufficiency 09/06/2019   Vitamin D deficiency     SURGICAL HISTORY: Past Surgical History:  Procedure Laterality Date   ABDOMINAL HERNIA REPAIR  2009   ABDOMINOPLASTY     AXILLARY LYMPH NODE BIOPSY Left 11/11/2020   Procedure: LEFT AXILLARY EXCISIONAL LYMPH NODE BIOPSY;  Surgeon: [name], MD;  Location: [facility];  Service: General;  Laterality: Left;   [place]   GASTRIC BYPASS  2000   hemorroid surgery  2007   IR IMAGING GUIDED PORT INSERTION  07/26/2021   IR KYPHO EA ADDL LEVEL THORACIC OR LUMBAR  05/31/2021   IR KYPHO THORACIC WITH BONE BIOPSY  05/31/2021   TONSILLECTOMY AND ADENOIDECTOMY  1957    I have reviewed the social history and family history with the patient and they are unchanged from previous note.  ALLERGIES:  is allergic to asa [aspirin] and nsaids.  MEDICATIONS:  Current Outpatient Medications  Medication Sig Dispense Refill   furosemide (LASIX) 20 MG tablet Take 1 tablet (20 mg total) by mouth daily. For up to 7 days then as needed for leg edema 30 tablet 0   potassium chloride (KLOR-CON M) 10 MEQ tablet Take 1 tablet (10 mEq total) by mouth 2 (two) times daily. Take along with lasix only 30 tablet 0   cyanocobalamin (,VITAMIN B-12,) 1000 MCG/ML injection Inject 1,000 mcg into the muscle every 30 (thirty) days.      cyclobenzaprine (FLEXERIL) 5 MG tablet Take 1 tablet (5 mg total) by mouth 3 (three) times daily as needed for muscle spasms. 30 tablet 0   glucose blood (ONETOUCH VERIO) test strip Monitor blood sugars twice daily 100 each 5   Lancets (ONETOUCH ULTRASOFT) lancets Monitor blood sugars twice daily 100 each 5   metoprolol succinate (TOPROL-XL) 25 MG 24 hr tablet Take 1 tablet (25 mg total) by mouth daily. 90 tablet 1   naloxone (NARCAN) nasal spray 4 mg/0.1 mL Place 1 spray into the nose once as needed (overdose). (Patient not taking: Reported on 12/01/2021) 2 each 0   Oxycodone HCl 10 MG TABS 1 tab every 4-6 hr prn for chronic pain 120 tablet 0   Oxycodone HCl 10 MG TABS 1 tab every 4-6 hours as needed for pain 120 tablet 0   polyethylene glycol powder (GLYCOLAX/MIRALAX) 17 GM/SCOOP powder Dissolve 1 capful (17 g) in water and drink 2 (two) times daily. (Patient taking differently: Take 17 g by mouth daily.) 510 g  2   pregabalin (LYRICA) 150 MG capsule Take 1 capsule (150 mg total) by mouth 2 (two) times daily. 180 capsule 1   senna-docusate (SENOKOT-S) 8.6-50 MG tablet Take 2 tablets by mouth at bedtime. (Patient taking differently: Take 2 tablets by mouth at bedtime as needed for mild constipation.) 60 tablet 2   sitaGLIPtin-metformin (JANUMET) 50-1000 MG tablet Take 1 tablet by mouth at bedtime. 90 tablet 1   No current facility-administered medications for this visit.   Facility-Administered Medications Ordered in Other Visits  Medication Dose Route Frequency Provider Last Rate Last Admin   0.9 %  sodium chloride infusion (Manually program via Guardrails IV Fluids)  250 mL Intravenous Once Truitt Merle, MD       azaCITIDine [facility]) 135 mg in sodium chloride 0.9 % 50 mL chemo infusion  75 mg/m2 (Treatment Plan Recorded) Intravenous Once Truitt Merle, MD       cyanocobalamin ((VITAMIN B-12)) injection 1,000 mcg  1,000 mcg Intramuscular Once Truitt Merle, MD       dexamethasone (DECADRON) 10 mg in  sodium chloride 0.9 % 50 mL IVPB  10 mg Intravenous Once Truitt Merle, MD 204 mL/hr at 01/03/22 0923 10 mg at 01/03/22 6286   heparin lock flush 100 unit/mL  500 Units Intracatheter Once PRN Truitt Merle, MD       palonosetron (ALOXI) injection 0.25 mg  0.25 mg Intravenous Once Truitt Merle, MD       romiPLOStim (NPLATE) injection 670 mcg  10 mcg/kg Subcutaneous Once Truitt Merle, MD       sodium chloride flush (NS) 0.9 % injection 10 mL  10 mL Intracatheter PRN Truitt Merle, MD        PHYSICAL EXAMINATION: ECOG PERFORMANCE STATUS: 1 - Symptomatic but completely ambulatory  Vitals:   01/03/22 0851  BP: 122/61  Pulse: 89  Resp: 15  Temp: 98.1 F (36.7 C)  SpO2: 100%   Wt Readings from Last 3 Encounters:  01/03/22 148 lb 1.6 oz (67.2 kg)  12/07/21 146 lb 8 oz (66.5 kg)  12/06/21 143 lb 9.6 oz (65.1 kg)     GENERAL:alert, no distress and comfortable SKIN: skin color normal, no rashes or significant lesions EYES: normal, Conjunctiva are pink and non-injected, sclera clear  HEART: (+) mild LE edema NEURO: alert & oriented x 3 with fluent speech  LABORATORY DATA:  I have reviewed the data as listed    Latest Ref Rng & Units 01/03/2022    8:36 AM 12/06/2021    7:46 AM 11/29/2021    8:03 AM  CBC  WBC 4.0 - 10.5 K/uL 4.2  5.5  13.6   Hemoglobin 12.0 - 15.0 g/dL 8.5  8.9  8.7   Hematocrit 36.0 - 46.0 % 27.1  27.8  27.1   Platelets 150 - 400 K/uL 184  185  89         Latest Ref Rng & Units 01/03/2022    8:36 AM 11/29/2021    8:03 AM 11/22/2021    8:53 AM  CMP  Glucose 70 - 99 mg/dL 207  138  284   BUN 8 - 23 mg/dL _0 Creatinine 0.44 - 1.00 mg/dL 0.62  0.64  0.64   Sodium 135 - 145 mmol/L 139  140  138   Potassium 3.5 - 5.1 mmol/L 4.1  4.1  3.9   Chloride 98 - 111 mmol/L 108  107  108   CO2 22 - 32 mmol/L 26  26  26   Calcium 8.9 - 10.3 mg/dL 8.5  8.7  8.4   Total Protein 6.5 - 8.1 g/dL 6.2  6.5  6.1   Total Bilirubin 0.3 - 1.2 mg/dL 0.7  0.8  0.6   Alkaline Phos 38 - 126 U/L  116  101  96   AST 15 - 41 U/L _0 ALT 0 - 44 U/L _1 RADIOGRAPHIC STUDIES: I have personally reviewed the radiological images as listed and agreed with the findings in the report. No results found.    No orders of the defined types were placed in this encounter.  All questions were answered. The patient knows to call the clinic with any problems, questions or concerns. No barriers to learning was detected. The total time spent in the appointment was 30 minutes.     Truitt Merle, MD 01/03/2022   I, Wilburn Mylar, am acting as scribe for Truitt Merle, MD.   I have reviewed the above documentation for accuracy and completeness, and I agree with the above.

## 2022-01-04 ENCOUNTER — Inpatient Hospital Stay: Payer: Medicare HMO

## 2022-01-04 VITALS — BP 114/64 | HR 94 | Temp 97.8°F | Resp 18

## 2022-01-04 DIAGNOSIS — E538 Deficiency of other specified B group vitamins: Secondary | ICD-10-CM | POA: Diagnosis not present

## 2022-01-04 DIAGNOSIS — E785 Hyperlipidemia, unspecified: Secondary | ICD-10-CM | POA: Diagnosis not present

## 2022-01-04 DIAGNOSIS — Z7952 Long term (current) use of systemic steroids: Secondary | ICD-10-CM | POA: Diagnosis not present

## 2022-01-04 DIAGNOSIS — R69 Illness, unspecified: Secondary | ICD-10-CM | POA: Diagnosis not present

## 2022-01-04 DIAGNOSIS — Z9884 Bariatric surgery status: Secondary | ICD-10-CM | POA: Diagnosis not present

## 2022-01-04 DIAGNOSIS — Z7984 Long term (current) use of oral hypoglycemic drugs: Secondary | ICD-10-CM | POA: Diagnosis not present

## 2022-01-04 DIAGNOSIS — D693 Immune thrombocytopenic purpura: Secondary | ICD-10-CM | POA: Diagnosis not present

## 2022-01-04 DIAGNOSIS — I10 Essential (primary) hypertension: Secondary | ICD-10-CM | POA: Diagnosis not present

## 2022-01-04 DIAGNOSIS — Z79899 Other long term (current) drug therapy: Secondary | ICD-10-CM | POA: Diagnosis not present

## 2022-01-04 DIAGNOSIS — E1142 Type 2 diabetes mellitus with diabetic polyneuropathy: Secondary | ICD-10-CM | POA: Diagnosis not present

## 2022-01-04 DIAGNOSIS — C9310 Chronic myelomonocytic leukemia not having achieved remission: Secondary | ICD-10-CM | POA: Diagnosis not present

## 2022-01-04 DIAGNOSIS — Z5111 Encounter for antineoplastic chemotherapy: Secondary | ICD-10-CM | POA: Diagnosis not present

## 2022-01-04 MED ORDER — SODIUM CHLORIDE 0.9 % IV SOLN: Freq: Once | INTRAVENOUS | Status: AC

## 2022-01-04 MED ORDER — SODIUM CHLORIDE 0.9% FLUSH
10.0000 mL | INTRAVENOUS | Status: DC | PRN
  Administered 2022-01-04: 10 mL

## 2022-01-04 MED ORDER — HEPARIN SOD (PORK) LOCK FLUSH 100 UNIT/ML IV SOLN
500.0000 [IU] | Freq: Once | INTRAVENOUS | Status: AC | PRN
  Administered 2022-01-04: 500 [IU]

## 2022-01-04 MED ORDER — SODIUM CHLORIDE 0.9 % IV SOLN
10.0000 mg | Freq: Once | INTRAVENOUS | Status: AC
  Administered 2022-01-04: 10 mg via INTRAVENOUS
  Filled 2022-01-04: qty 10

## 2022-01-04 MED ORDER — SODIUM CHLORIDE 0.9 % IV SOLN
75.0000 mg/m2 | Freq: Once | INTRAVENOUS | Status: AC
  Administered 2022-01-04: 135 mg via INTRAVENOUS
  Filled 2022-01-04: qty 13.5

## 2022-01-04 MED FILL — Dexamethasone Sodium Phosphate Inj 100 MG/10ML: INTRAMUSCULAR | Qty: 1 | Status: AC

## 2022-01-04 NOTE — Patient Instructions (Signed)
[facility]  Discharge Instructions: Thank you for choosing [facility] to provide your oncology and hematology care.   If you have a lab appointment with the [facility], please go directly to the [facility] and check in at the registration area.   Wear comfortable clothing and clothing appropriate for easy access to any Portacath or PICC line.   We strive to give you quality time with your provider. You may need to reschedule your appointment if you arrive late (15 or more minutes).  Arriving late affects you and other patients whose appointments are after yours.  Also, if you miss three or more appointments without notifying the office, you may be dismissed from the clinic at the provider's discretion.      For prescription refill requests, have your pharmacy contact our office and allow 72 hours for refills to be completed.    Today you received the following chemotherapy and/or immunotherapy agents vidaza      To help prevent nausea and vomiting after your treatment, we encourage you to take your nausea medication as directed.  BELOW ARE SYMPTOMS THAT SHOULD BE REPORTED IMMEDIATELY: *FEVER GREATER THAN 100.4 F (38 C) OR HIGHER *CHILLS OR SWEATING *NAUSEA AND VOMITING THAT IS NOT CONTROLLED WITH YOUR NAUSEA MEDICATION *UNUSUAL SHORTNESS OF BREATH *UNUSUAL BRUISING OR BLEEDING *URINARY PROBLEMS (pain or burning when urinating, or frequent urination) *BOWEL PROBLEMS (unusual diarrhea, constipation, pain near the anus) TENDERNESS IN MOUTH AND THROAT WITH OR WITHOUT PRESENCE OF ULCERS (sore throat, sores in mouth, or a toothache) UNUSUAL RASH, SWELLING OR PAIN  UNUSUAL VAGINAL DISCHARGE OR ITCHING   Items with * indicate a potential emergency and should be followed up as soon as possible or go to the Emergency Department if any problems should occur.  Please show the CHEMOTHERAPY ALERT CARD or IMMUNOTHERAPY ALERT CARD at check-in to the  Emergency Department and triage nurse.  Should you have questions after your visit or need to cancel or reschedule your appointment, please contact [facility]  Dept: [phone]  and follow the prompts.  Office hours are 8:00 a.m. to 4:30 p.m. Monday - Friday. Please note that voicemails left after 4:00 p.m. may not be returned until the following business day.  We are closed weekends and major holidays. You have access to a nurse at all times for urgent questions. Please call the main number to the clinic Dept: [phone] and follow the prompts.   For any non-urgent questions, you may also contact your provider using MyChart. We now offer e-Visits for anyone 53 and older to request care online for non-urgent symptoms. For details visit [email or web address].   Also download the MyChart app! Go to the app store, search "MyChart", open the app, select [facility], and log in with your MyChart username and password.  Masks are optional in the cancer centers. If you would like for your care team to wear a mask while they are taking care of you, please let them know. For doctor visits, patients may have with them one support person who is at least 70 years old. At this time, visitors are not allowed in the infusion area.

## 2022-01-05 ENCOUNTER — Other Ambulatory Visit: Payer: Self-pay

## 2022-01-05 ENCOUNTER — Telehealth: Payer: Self-pay | Admitting: Hematology

## 2022-01-05 ENCOUNTER — Inpatient Hospital Stay: Payer: Medicare HMO

## 2022-01-05 VITALS — BP 115/54 | HR 91 | Temp 97.8°F | Resp 18

## 2022-01-05 DIAGNOSIS — Z9884 Bariatric surgery status: Secondary | ICD-10-CM | POA: Diagnosis not present

## 2022-01-05 DIAGNOSIS — Z7984 Long term (current) use of oral hypoglycemic drugs: Secondary | ICD-10-CM | POA: Diagnosis not present

## 2022-01-05 DIAGNOSIS — Z5111 Encounter for antineoplastic chemotherapy: Secondary | ICD-10-CM | POA: Diagnosis not present

## 2022-01-05 DIAGNOSIS — E785 Hyperlipidemia, unspecified: Secondary | ICD-10-CM | POA: Diagnosis not present

## 2022-01-05 DIAGNOSIS — E538 Deficiency of other specified B group vitamins: Secondary | ICD-10-CM | POA: Diagnosis not present

## 2022-01-05 DIAGNOSIS — I10 Essential (primary) hypertension: Secondary | ICD-10-CM | POA: Diagnosis not present

## 2022-01-05 DIAGNOSIS — Z79899 Other long term (current) drug therapy: Secondary | ICD-10-CM | POA: Diagnosis not present

## 2022-01-05 DIAGNOSIS — R69 Illness, unspecified: Secondary | ICD-10-CM | POA: Diagnosis not present

## 2022-01-05 DIAGNOSIS — E1142 Type 2 diabetes mellitus with diabetic polyneuropathy: Secondary | ICD-10-CM | POA: Diagnosis not present

## 2022-01-05 DIAGNOSIS — C9310 Chronic myelomonocytic leukemia not having achieved remission: Secondary | ICD-10-CM

## 2022-01-05 DIAGNOSIS — Z7952 Long term (current) use of systemic steroids: Secondary | ICD-10-CM | POA: Diagnosis not present

## 2022-01-05 DIAGNOSIS — D693 Immune thrombocytopenic purpura: Secondary | ICD-10-CM | POA: Diagnosis not present

## 2022-01-05 MED ORDER — SODIUM CHLORIDE 0.9 % IV SOLN: Freq: Once | INTRAVENOUS | Status: AC

## 2022-01-05 MED ORDER — HEPARIN SOD (PORK) LOCK FLUSH 100 UNIT/ML IV SOLN
500.0000 [IU] | Freq: Once | INTRAVENOUS | Status: AC | PRN
  Administered 2022-01-05: 500 [IU]

## 2022-01-05 MED ORDER — SODIUM CHLORIDE 0.9% FLUSH
10.0000 mL | INTRAVENOUS | Status: DC | PRN
  Administered 2022-01-05: 10 mL

## 2022-01-05 MED ORDER — SODIUM CHLORIDE 0.9 % IV SOLN
75.0000 mg/m2 | Freq: Once | INTRAVENOUS | Status: AC
  Administered 2022-01-05: 135 mg via INTRAVENOUS
  Filled 2022-01-05: qty 13.5

## 2022-01-05 MED ORDER — PALONOSETRON HCL INJECTION 0.25 MG/5ML
0.2500 mg | Freq: Once | INTRAVENOUS | Status: AC
  Administered 2022-01-05: 0.25 mg via INTRAVENOUS
  Filled 2022-01-05: qty 5

## 2022-01-05 MED ORDER — SODIUM CHLORIDE 0.9 % IV SOLN
10.0000 mg | Freq: Once | INTRAVENOUS | Status: AC
  Administered 2022-01-05: 10 mg via INTRAVENOUS
  Filled 2022-01-05: qty 10

## 2022-01-05 MED FILL — Dexamethasone Sodium Phosphate Inj 100 MG/10ML: INTRAMUSCULAR | Qty: 1 | Status: AC

## 2022-01-05 NOTE — Telephone Encounter (Signed)
Left message with follow-up appointments per 7/24 los.

## 2022-01-06 ENCOUNTER — Inpatient Hospital Stay: Payer: Medicare HMO

## 2022-01-06 VITALS — BP 104/55 | HR 83 | Temp 98.5°F | Resp 18

## 2022-01-06 DIAGNOSIS — Z5111 Encounter for antineoplastic chemotherapy: Secondary | ICD-10-CM | POA: Diagnosis not present

## 2022-01-06 DIAGNOSIS — Z7984 Long term (current) use of oral hypoglycemic drugs: Secondary | ICD-10-CM | POA: Diagnosis not present

## 2022-01-06 DIAGNOSIS — E1142 Type 2 diabetes mellitus with diabetic polyneuropathy: Secondary | ICD-10-CM | POA: Diagnosis not present

## 2022-01-06 DIAGNOSIS — C9310 Chronic myelomonocytic leukemia not having achieved remission: Secondary | ICD-10-CM | POA: Diagnosis not present

## 2022-01-06 DIAGNOSIS — Z7952 Long term (current) use of systemic steroids: Secondary | ICD-10-CM | POA: Diagnosis not present

## 2022-01-06 DIAGNOSIS — R69 Illness, unspecified: Secondary | ICD-10-CM | POA: Diagnosis not present

## 2022-01-06 DIAGNOSIS — D693 Immune thrombocytopenic purpura: Secondary | ICD-10-CM | POA: Diagnosis not present

## 2022-01-06 DIAGNOSIS — E785 Hyperlipidemia, unspecified: Secondary | ICD-10-CM | POA: Diagnosis not present

## 2022-01-06 DIAGNOSIS — Z9884 Bariatric surgery status: Secondary | ICD-10-CM | POA: Diagnosis not present

## 2022-01-06 DIAGNOSIS — I10 Essential (primary) hypertension: Secondary | ICD-10-CM | POA: Diagnosis not present

## 2022-01-06 DIAGNOSIS — E538 Deficiency of other specified B group vitamins: Secondary | ICD-10-CM | POA: Diagnosis not present

## 2022-01-06 DIAGNOSIS — Z79899 Other long term (current) drug therapy: Secondary | ICD-10-CM | POA: Diagnosis not present

## 2022-01-06 MED ORDER — SODIUM CHLORIDE 0.9% FLUSH
10.0000 mL | INTRAVENOUS | Status: DC | PRN
  Administered 2022-01-06: 10 mL

## 2022-01-06 MED ORDER — SODIUM CHLORIDE 0.9 % IV SOLN
10.0000 mg | Freq: Once | INTRAVENOUS | Status: AC
  Administered 2022-01-06: 10 mg via INTRAVENOUS
  Filled 2022-01-06: qty 10

## 2022-01-06 MED ORDER — SODIUM CHLORIDE 0.9 % IV SOLN
75.0000 mg/m2 | Freq: Once | INTRAVENOUS | Status: AC
  Administered 2022-01-06: 135 mg via INTRAVENOUS
  Filled 2022-01-06: qty 13.5

## 2022-01-06 MED ORDER — HEPARIN SOD (PORK) LOCK FLUSH 100 UNIT/ML IV SOLN
500.0000 [IU] | Freq: Once | INTRAVENOUS | Status: AC | PRN
  Administered 2022-01-06: 500 [IU]

## 2022-01-06 MED ORDER — SODIUM CHLORIDE 0.9 % IV SOLN: Freq: Once | INTRAVENOUS | Status: AC

## 2022-01-06 MED FILL — Dexamethasone Sodium Phosphate Inj 100 MG/10ML: INTRAMUSCULAR | Qty: 1 | Status: AC

## 2022-01-06 NOTE — Patient Instructions (Signed)
[facility]  Discharge Instructions: Thank you for choosing [facility] to provide your oncology and hematology care.   If you have a lab appointment with the [facility], please go directly to the [facility] and check in at the registration area.   Wear comfortable clothing and clothing appropriate for easy access to any Portacath or PICC line.   We strive to give you quality time with your provider. You may need to reschedule your appointment if you arrive late (15 or more minutes).  Arriving late affects you and other patients whose appointments are after yours.  Also, if you miss three or more appointments without notifying the office, you may be dismissed from the clinic at the provider's discretion.      For prescription refill requests, have your pharmacy contact our office and allow 72 hours for refills to be completed.    Today you received the following chemotherapy and/or immunotherapy agents vidaza      To help prevent nausea and vomiting after your treatment, we encourage you to take your nausea medication as directed.  BELOW ARE SYMPTOMS THAT SHOULD BE REPORTED IMMEDIATELY: *FEVER GREATER THAN 100.4 F (38 C) OR HIGHER *CHILLS OR SWEATING *NAUSEA AND VOMITING THAT IS NOT CONTROLLED WITH YOUR NAUSEA MEDICATION *UNUSUAL SHORTNESS OF BREATH *UNUSUAL BRUISING OR BLEEDING *URINARY PROBLEMS (pain or burning when urinating, or frequent urination) *BOWEL PROBLEMS (unusual diarrhea, constipation, pain near the anus) TENDERNESS IN MOUTH AND THROAT WITH OR WITHOUT PRESENCE OF ULCERS (sore throat, sores in mouth, or a toothache) UNUSUAL RASH, SWELLING OR PAIN  UNUSUAL VAGINAL DISCHARGE OR ITCHING   Items with * indicate a potential emergency and should be followed up as soon as possible or go to the Emergency Department if any problems should occur.  Please show the CHEMOTHERAPY ALERT CARD or IMMUNOTHERAPY ALERT CARD at check-in to the  Emergency Department and triage nurse.  Should you have questions after your visit or need to cancel or reschedule your appointment, please contact [facility]  Dept: [phone]  and follow the prompts.  Office hours are 8:00 a.m. to 4:30 p.m. Monday - Friday. Please note that voicemails left after 4:00 p.m. may not be returned until the following business day.  We are closed weekends and major holidays. You have access to a nurse at all times for urgent questions. Please call the main number to the clinic Dept: [phone] and follow the prompts.   For any non-urgent questions, you may also contact your provider using MyChart. We now offer e-Visits for anyone 66 and older to request care online for non-urgent symptoms. For details visit [email or web address].   Also download the MyChart app! Go to the app store, search "MyChart", open the app, select [facility], and log in with your MyChart username and password.  Masks are optional in the cancer centers. If you would like for your care team to wear a mask while they are taking care of you, please let them know. For doctor visits, patients may have with them one support person who is at least 70 years old. At this time, visitors are not allowed in the infusion area.

## 2022-01-07 ENCOUNTER — Other Ambulatory Visit: Payer: Self-pay

## 2022-01-07 ENCOUNTER — Inpatient Hospital Stay: Payer: Medicare HMO

## 2022-01-07 VITALS — BP 131/57 | HR 76 | Temp 98.0°F | Resp 16

## 2022-01-07 DIAGNOSIS — D693 Immune thrombocytopenic purpura: Secondary | ICD-10-CM | POA: Diagnosis not present

## 2022-01-07 DIAGNOSIS — Z79899 Other long term (current) drug therapy: Secondary | ICD-10-CM | POA: Diagnosis not present

## 2022-01-07 DIAGNOSIS — R69 Illness, unspecified: Secondary | ICD-10-CM | POA: Diagnosis not present

## 2022-01-07 DIAGNOSIS — C9310 Chronic myelomonocytic leukemia not having achieved remission: Secondary | ICD-10-CM | POA: Diagnosis not present

## 2022-01-07 DIAGNOSIS — E785 Hyperlipidemia, unspecified: Secondary | ICD-10-CM | POA: Diagnosis not present

## 2022-01-07 DIAGNOSIS — Z7952 Long term (current) use of systemic steroids: Secondary | ICD-10-CM | POA: Diagnosis not present

## 2022-01-07 DIAGNOSIS — Z9884 Bariatric surgery status: Secondary | ICD-10-CM | POA: Diagnosis not present

## 2022-01-07 DIAGNOSIS — I10 Essential (primary) hypertension: Secondary | ICD-10-CM | POA: Diagnosis not present

## 2022-01-07 DIAGNOSIS — E1142 Type 2 diabetes mellitus with diabetic polyneuropathy: Secondary | ICD-10-CM | POA: Diagnosis not present

## 2022-01-07 DIAGNOSIS — E538 Deficiency of other specified B group vitamins: Secondary | ICD-10-CM | POA: Diagnosis not present

## 2022-01-07 DIAGNOSIS — Z7984 Long term (current) use of oral hypoglycemic drugs: Secondary | ICD-10-CM | POA: Diagnosis not present

## 2022-01-07 DIAGNOSIS — Z5111 Encounter for antineoplastic chemotherapy: Secondary | ICD-10-CM | POA: Diagnosis not present

## 2022-01-07 MED ORDER — SODIUM CHLORIDE 0.9 % IV SOLN
75.0000 mg/m2 | Freq: Once | INTRAVENOUS | Status: AC
  Administered 2022-01-07: 135 mg via INTRAVENOUS
  Filled 2022-01-07: qty 13.5

## 2022-01-07 MED ORDER — PALONOSETRON HCL INJECTION 0.25 MG/5ML
0.2500 mg | Freq: Once | INTRAVENOUS | Status: AC
  Administered 2022-01-07: 0.25 mg via INTRAVENOUS
  Filled 2022-01-07: qty 5

## 2022-01-07 MED ORDER — SODIUM CHLORIDE 0.9 % IV SOLN
10.0000 mg | Freq: Once | INTRAVENOUS | Status: AC
  Administered 2022-01-07: 10 mg via INTRAVENOUS
  Filled 2022-01-07: qty 10

## 2022-01-07 MED ORDER — SODIUM CHLORIDE 0.9% FLUSH
10.0000 mL | INTRAVENOUS | Status: DC | PRN
  Administered 2022-01-07: 10 mL

## 2022-01-07 MED ORDER — HEPARIN SOD (PORK) LOCK FLUSH 100 UNIT/ML IV SOLN
500.0000 [IU] | Freq: Once | INTRAVENOUS | Status: AC | PRN
  Administered 2022-01-07: 500 [IU]

## 2022-01-07 MED ORDER — SODIUM CHLORIDE 0.9 % IV SOLN: Freq: Once | INTRAVENOUS | Status: AC

## 2022-01-07 NOTE — Patient Instructions (Signed)
[facility]   Discharge Instructions: Thank you for choosing [facility] to provide your oncology and hematology care.   If you have a lab appointment with the [facility], please go directly to the [facility] and check in at the registration area.   Wear comfortable clothing and clothing appropriate for easy access to any Portacath or PICC line.   We strive to give you quality time with your provider. You may need to reschedule your appointment if you arrive late (15 or more minutes).  Arriving late affects you and other patients whose appointments are after yours.  Also, if you miss three or more appointments without notifying the office, you may be dismissed from the clinic at the provider's discretion.      For prescription refill requests, have your pharmacy contact our office and allow 72 hours for refills to be completed.    Today you received the following chemotherapy and/or immunotherapy agents: azacitadine      To help prevent nausea and vomiting after your treatment, we encourage you to take your nausea medication as directed.  BELOW ARE SYMPTOMS THAT SHOULD BE REPORTED IMMEDIATELY: *FEVER GREATER THAN 100.4 F (38 C) OR HIGHER *CHILLS OR SWEATING *NAUSEA AND VOMITING THAT IS NOT CONTROLLED WITH YOUR NAUSEA MEDICATION *UNUSUAL SHORTNESS OF BREATH *UNUSUAL BRUISING OR BLEEDING *URINARY PROBLEMS (pain or burning when urinating, or frequent urination) *BOWEL PROBLEMS (unusual diarrhea, constipation, pain near the anus) TENDERNESS IN MOUTH AND THROAT WITH OR WITHOUT PRESENCE OF ULCERS (sore throat, sores in mouth, or a toothache) UNUSUAL RASH, SWELLING OR PAIN  UNUSUAL VAGINAL DISCHARGE OR ITCHING   Items with * indicate a potential emergency and should be followed up as soon as possible or go to the Emergency Department if any problems should occur.  Please show the CHEMOTHERAPY ALERT CARD or IMMUNOTHERAPY ALERT CARD at check-in  to the Emergency Department and triage nurse.  Should you have questions after your visit or need to cancel or reschedule your appointment, please contact [facility]  Dept: [phone]  and follow the prompts.  Office hours are 8:00 a.m. to 4:30 p.m. Monday - Friday. Please note that voicemails left after 4:00 p.m. may not be returned until the following business day.  We are closed weekends and major holidays. You have access to a nurse at all times for urgent questions. Please call the main number to the clinic Dept: [phone] and follow the prompts.   For any non-urgent questions, you may also contact your provider using MyChart. We now offer e-Visits for anyone 24 and older to request care online for non-urgent symptoms. For details visit [email or web address].   Also download the MyChart app! Go to the app store, search "MyChart", open the app, select [facility], and log in with your MyChart username and password.  Masks are optional in the cancer centers. If you would like for your care team to wear a mask while they are taking care of you, please let them know. For doctor visits, patients may have with them one support person who is at least 70 years old. At this time, visitors are not allowed in the infusion area.

## 2022-01-10 ENCOUNTER — Other Ambulatory Visit: Payer: Self-pay

## 2022-01-10 ENCOUNTER — Inpatient Hospital Stay: Payer: Medicare HMO

## 2022-01-10 VITALS — BP 130/69 | HR 100 | Temp 98.0°F | Resp 18

## 2022-01-10 DIAGNOSIS — R69 Illness, unspecified: Secondary | ICD-10-CM | POA: Diagnosis not present

## 2022-01-10 DIAGNOSIS — Z95828 Presence of other vascular implants and grafts: Secondary | ICD-10-CM

## 2022-01-10 DIAGNOSIS — E785 Hyperlipidemia, unspecified: Secondary | ICD-10-CM | POA: Diagnosis not present

## 2022-01-10 DIAGNOSIS — D696 Thrombocytopenia, unspecified: Secondary | ICD-10-CM

## 2022-01-10 DIAGNOSIS — D693 Immune thrombocytopenic purpura: Secondary | ICD-10-CM | POA: Diagnosis not present

## 2022-01-10 DIAGNOSIS — Z7952 Long term (current) use of systemic steroids: Secondary | ICD-10-CM | POA: Diagnosis not present

## 2022-01-10 DIAGNOSIS — Z79899 Other long term (current) drug therapy: Secondary | ICD-10-CM | POA: Diagnosis not present

## 2022-01-10 DIAGNOSIS — Z7984 Long term (current) use of oral hypoglycemic drugs: Secondary | ICD-10-CM | POA: Diagnosis not present

## 2022-01-10 DIAGNOSIS — Z9884 Bariatric surgery status: Secondary | ICD-10-CM | POA: Diagnosis not present

## 2022-01-10 DIAGNOSIS — C9310 Chronic myelomonocytic leukemia not having achieved remission: Secondary | ICD-10-CM

## 2022-01-10 DIAGNOSIS — Z5111 Encounter for antineoplastic chemotherapy: Secondary | ICD-10-CM | POA: Diagnosis not present

## 2022-01-10 DIAGNOSIS — I10 Essential (primary) hypertension: Secondary | ICD-10-CM | POA: Diagnosis not present

## 2022-01-10 DIAGNOSIS — E1142 Type 2 diabetes mellitus with diabetic polyneuropathy: Secondary | ICD-10-CM | POA: Diagnosis not present

## 2022-01-10 DIAGNOSIS — E538 Deficiency of other specified B group vitamins: Secondary | ICD-10-CM | POA: Diagnosis not present

## 2022-01-10 LAB — CBC WITH DIFFERENTIAL (CANCER CENTER ONLY)
Abs Immature Granulocytes: 0.3 10*3/uL — ABNORMAL HIGH (ref 0.00–0.07)
Basophils Absolute: 0.1 10*3/uL (ref 0.0–0.1)
Basophils Relative: 1 %
Eosinophils Absolute: 0.1 10*3/uL (ref 0.0–0.5)
Eosinophils Relative: 1 %
HCT: 28.6 % — ABNORMAL LOW (ref 36.0–46.0)
Hemoglobin: 8.9 g/dL — ABNORMAL LOW (ref 12.0–15.0)
Immature Granulocytes: 5 %
Lymphocytes Relative: 12 %
Lymphs Abs: 0.8 10*3/uL (ref 0.7–4.0)
MCH: 25.3 pg — ABNORMAL LOW (ref 26.0–34.0)
MCHC: 31.1 g/dL (ref 30.0–36.0)
MCV: 81.3 fL (ref 80.0–100.0)
Monocytes Absolute: 0.9 10*3/uL (ref 0.1–1.0)
Monocytes Relative: 14 %
Neutro Abs: 4.4 10*3/uL (ref 1.7–7.7)
Neutrophils Relative %: 67 %
Platelet Count: 101 10*3/uL — ABNORMAL LOW (ref 150–400)
RBC: 3.52 MIL/uL — ABNORMAL LOW (ref 3.87–5.11)
RDW: 18.9 % — ABNORMAL HIGH (ref 11.5–15.5)
WBC Count: 6.5 10*3/uL (ref 4.0–10.5)
nRBC: 0 % (ref 0.0–0.2)

## 2022-01-10 LAB — SAMPLE TO BLOOD BANK

## 2022-01-10 MED ORDER — HEPARIN SOD (PORK) LOCK FLUSH 100 UNIT/ML IV SOLN: 250.0000 [IU] | Freq: Once | INTRAVENOUS | Status: DC | PRN

## 2022-01-10 MED ORDER — SODIUM CHLORIDE 0.9% FLUSH
10.0000 mL | Freq: Once | INTRAVENOUS | Status: AC
  Administered 2022-01-10: 10 mL

## 2022-01-10 MED ORDER — HEPARIN SOD (PORK) LOCK FLUSH 100 UNIT/ML IV SOLN
500.0000 [IU] | Freq: Once | INTRAVENOUS | Status: AC
  Administered 2022-01-10: 500 [IU]

## 2022-01-10 MED ORDER — SODIUM CHLORIDE 0.9% FLUSH: 3.0000 mL | Freq: Once | INTRAVENOUS | Status: DC | PRN

## 2022-01-10 NOTE — Progress Notes (Signed)
CRITICAL VALUE STICKER  CRITICAL VALUE: HGB 8.9  RECEIVER (on-site recipient of call): Dujuan Stankowski P. LPN  DATE & TIME NOTIFIED: 01/10/22 9:43 am  MESSENGER (representative from lab): Tomi Bamberger  MD NOTIFIED: Dr.  Burr Medico

## 2022-01-13 DIAGNOSIS — C9310 Chronic myelomonocytic leukemia not having achieved remission: Secondary | ICD-10-CM | POA: Diagnosis not present

## 2022-01-13 DIAGNOSIS — R768 Other specified abnormal immunological findings in serum: Secondary | ICD-10-CM | POA: Diagnosis not present

## 2022-01-13 DIAGNOSIS — E1169 Type 2 diabetes mellitus with other specified complication: Secondary | ICD-10-CM | POA: Diagnosis not present

## 2022-01-17 ENCOUNTER — Inpatient Hospital Stay: Payer: Medicare HMO | Attending: Hematology

## 2022-01-17 ENCOUNTER — Inpatient Hospital Stay: Payer: Medicare HMO

## 2022-01-17 DIAGNOSIS — E1142 Type 2 diabetes mellitus with diabetic polyneuropathy: Secondary | ICD-10-CM | POA: Diagnosis not present

## 2022-01-17 DIAGNOSIS — D693 Immune thrombocytopenic purpura: Secondary | ICD-10-CM | POA: Insufficient documentation

## 2022-01-17 DIAGNOSIS — Z7984 Long term (current) use of oral hypoglycemic drugs: Secondary | ICD-10-CM | POA: Diagnosis not present

## 2022-01-17 DIAGNOSIS — C9310 Chronic myelomonocytic leukemia not having achieved remission: Secondary | ICD-10-CM | POA: Diagnosis not present

## 2022-01-17 DIAGNOSIS — Z5111 Encounter for antineoplastic chemotherapy: Secondary | ICD-10-CM | POA: Diagnosis not present

## 2022-01-17 DIAGNOSIS — Z95828 Presence of other vascular implants and grafts: Secondary | ICD-10-CM

## 2022-01-17 DIAGNOSIS — Z79899 Other long term (current) drug therapy: Secondary | ICD-10-CM | POA: Diagnosis not present

## 2022-01-17 DIAGNOSIS — D696 Thrombocytopenia, unspecified: Secondary | ICD-10-CM

## 2022-01-17 DIAGNOSIS — E538 Deficiency of other specified B group vitamins: Secondary | ICD-10-CM

## 2022-01-17 LAB — CBC WITH DIFFERENTIAL (CANCER CENTER ONLY)
Abs Immature Granulocytes: 0.49 10*3/uL — ABNORMAL HIGH (ref 0.00–0.07)
Basophils Absolute: 0.1 10*3/uL (ref 0.0–0.1)
Basophils Relative: 1 %
Eosinophils Absolute: 0 10*3/uL (ref 0.0–0.5)
Eosinophils Relative: 0 %
HCT: 27.9 % — ABNORMAL LOW (ref 36.0–46.0)
Hemoglobin: 9 g/dL — ABNORMAL LOW (ref 12.0–15.0)
Immature Granulocytes: 6 %
Lymphocytes Relative: 9 %
Lymphs Abs: 0.8 10*3/uL (ref 0.7–4.0)
MCH: 26.3 pg (ref 26.0–34.0)
MCHC: 32.3 g/dL (ref 30.0–36.0)
MCV: 81.6 fL (ref 80.0–100.0)
Monocytes Absolute: 1.5 10*3/uL — ABNORMAL HIGH (ref 0.1–1.0)
Monocytes Relative: 18 %
Neutro Abs: 5.5 10*3/uL (ref 1.7–7.7)
Neutrophils Relative %: 66 %
Platelet Count: 37 10*3/uL — ABNORMAL LOW (ref 150–400)
RBC: 3.42 MIL/uL — ABNORMAL LOW (ref 3.87–5.11)
RDW: 19.9 % — ABNORMAL HIGH (ref 11.5–15.5)
Smear Review: NORMAL
WBC Count: 8.4 10*3/uL (ref 4.0–10.5)
nRBC: 0 % (ref 0.0–0.2)

## 2022-01-17 LAB — COMPREHENSIVE METABOLIC PANEL
ALT: 18 U/L (ref 0–44)
AST: 20 U/L (ref 15–41)
Albumin: 3.3 g/dL — ABNORMAL LOW (ref 3.5–5.0)
Alkaline Phosphatase: 96 U/L (ref 38–126)
Anion gap: 5 (ref 5–15)
BUN: 15 mg/dL (ref 8–23)
CO2: 26 mmol/L (ref 22–32)
Calcium: 8.2 mg/dL — ABNORMAL LOW (ref 8.9–10.3)
Chloride: 108 mmol/L (ref 98–111)
Creatinine, Ser: 0.6 mg/dL (ref 0.44–1.00)
GFR, Estimated: >60 mL/min (ref >60)
Glucose, Bld: 230 mg/dL — ABNORMAL HIGH (ref 70–99)
Potassium: 4.1 mmol/L (ref 3.5–5.1)
Sodium: 139 mmol/L (ref 135–145)
Total Bilirubin: 0.9 mg/dL (ref 0.3–1.2)
Total Protein: 6.5 g/dL (ref 6.5–8.1)

## 2022-01-17 LAB — VITAMIN B12: Vitamin B-12: 2654 pg/mL — ABNORMAL HIGH (ref 180–914)

## 2022-01-17 LAB — SAMPLE TO BLOOD BANK

## 2022-01-17 MED ORDER — HEPARIN SOD (PORK) LOCK FLUSH 100 UNIT/ML IV SOLN
500.0000 [IU] | Freq: Once | INTRAVENOUS | Status: AC
  Administered 2022-01-17: 500 [IU]

## 2022-01-17 MED ORDER — SODIUM CHLORIDE 0.9% FLUSH
10.0000 mL | Freq: Once | INTRAVENOUS | Status: AC
  Administered 2022-01-17: 10 mL

## 2022-01-18 ENCOUNTER — Other Ambulatory Visit: Payer: Self-pay | Admitting: Hematology

## 2022-01-18 ENCOUNTER — Telehealth: Payer: Self-pay

## 2022-01-18 NOTE — Telephone Encounter (Signed)
LVM for pt to call Dr. Ernestina Penna office regarding her recent lab results.  Pt's plt 34 on 01/17/2022 and Dr. Burr Medico would like for the pt come in for Nplate on 37/62/8315 or 01/19/2022.  Dr. Burr Medico also would like pt to come in next week for lab draw which pt is already scheduled on 01/24/2022 for labs.  Awaiting pt's return telephone call.  If pt does not return call before flush closes for the day, this RN will schedule injection appt on 01/19/2022.

## 2022-01-19 ENCOUNTER — Other Ambulatory Visit: Payer: Self-pay

## 2022-01-19 ENCOUNTER — Inpatient Hospital Stay: Payer: Medicare HMO

## 2022-01-19 VITALS — BP 115/64 | HR 88 | Resp 18

## 2022-01-19 DIAGNOSIS — E1142 Type 2 diabetes mellitus with diabetic polyneuropathy: Secondary | ICD-10-CM | POA: Diagnosis not present

## 2022-01-19 DIAGNOSIS — Z5111 Encounter for antineoplastic chemotherapy: Secondary | ICD-10-CM | POA: Diagnosis not present

## 2022-01-19 DIAGNOSIS — D696 Thrombocytopenia, unspecified: Secondary | ICD-10-CM

## 2022-01-19 DIAGNOSIS — C9310 Chronic myelomonocytic leukemia not having achieved remission: Secondary | ICD-10-CM | POA: Diagnosis not present

## 2022-01-19 DIAGNOSIS — Z95828 Presence of other vascular implants and grafts: Secondary | ICD-10-CM

## 2022-01-19 DIAGNOSIS — Z79899 Other long term (current) drug therapy: Secondary | ICD-10-CM | POA: Diagnosis not present

## 2022-01-19 DIAGNOSIS — D693 Immune thrombocytopenic purpura: Secondary | ICD-10-CM | POA: Diagnosis not present

## 2022-01-19 DIAGNOSIS — Z7984 Long term (current) use of oral hypoglycemic drugs: Secondary | ICD-10-CM | POA: Diagnosis not present

## 2022-01-19 MED ORDER — ROMIPLOSTIM INJECTION 500 MCG
340.0000 ug | Freq: Once | SUBCUTANEOUS | Status: AC
  Administered 2022-01-19: 340 ug via SUBCUTANEOUS
  Filled 2022-01-19: qty 0.5

## 2022-01-19 NOTE — Progress Notes (Signed)
Nplate ~5 mcg/kg confirmed with MD Burr Medico.   Larene Beach, PharmD

## 2022-01-24 ENCOUNTER — Telehealth: Payer: Self-pay

## 2022-01-24 ENCOUNTER — Encounter: Payer: Self-pay | Admitting: Hematology

## 2022-01-24 ENCOUNTER — Other Ambulatory Visit: Payer: Self-pay

## 2022-01-24 ENCOUNTER — Inpatient Hospital Stay: Payer: Medicare HMO

## 2022-01-24 DIAGNOSIS — D696 Thrombocytopenia, unspecified: Secondary | ICD-10-CM

## 2022-01-24 DIAGNOSIS — Z7984 Long term (current) use of oral hypoglycemic drugs: Secondary | ICD-10-CM | POA: Diagnosis not present

## 2022-01-24 DIAGNOSIS — C9310 Chronic myelomonocytic leukemia not having achieved remission: Secondary | ICD-10-CM | POA: Diagnosis not present

## 2022-01-24 DIAGNOSIS — Z5111 Encounter for antineoplastic chemotherapy: Secondary | ICD-10-CM | POA: Diagnosis not present

## 2022-01-24 DIAGNOSIS — Z95828 Presence of other vascular implants and grafts: Secondary | ICD-10-CM

## 2022-01-24 DIAGNOSIS — Z79899 Other long term (current) drug therapy: Secondary | ICD-10-CM | POA: Diagnosis not present

## 2022-01-24 DIAGNOSIS — D693 Immune thrombocytopenic purpura: Secondary | ICD-10-CM | POA: Diagnosis not present

## 2022-01-24 DIAGNOSIS — E1142 Type 2 diabetes mellitus with diabetic polyneuropathy: Secondary | ICD-10-CM | POA: Diagnosis not present

## 2022-01-24 LAB — CBC WITH DIFFERENTIAL (CANCER CENTER ONLY)
Abs Immature Granulocytes: 0.05 10*3/uL (ref 0.00–0.07)
Basophils Absolute: 0 10*3/uL (ref 0.0–0.1)
Basophils Relative: 0 %
Eosinophils Absolute: 0 10*3/uL (ref 0.0–0.5)
Eosinophils Relative: 0 %
HCT: 26.6 % — ABNORMAL LOW (ref 36.0–46.0)
Hemoglobin: 8.4 g/dL — ABNORMAL LOW (ref 12.0–15.0)
Immature Granulocytes: 1 %
Lymphocytes Relative: 8 %
Lymphs Abs: 0.6 10*3/uL — ABNORMAL LOW (ref 0.7–4.0)
MCH: 26.2 pg (ref 26.0–34.0)
MCHC: 31.6 g/dL (ref 30.0–36.0)
MCV: 82.9 fL (ref 80.0–100.0)
Monocytes Absolute: 1.5 10*3/uL — ABNORMAL HIGH (ref 0.1–1.0)
Monocytes Relative: 21 %
Neutro Abs: 4.9 10*3/uL (ref 1.7–7.7)
Neutrophils Relative %: 70 %
Platelet Count: 31 10*3/uL — ABNORMAL LOW (ref 150–400)
RBC: 3.21 MIL/uL — ABNORMAL LOW (ref 3.87–5.11)
RDW: 20.9 % — ABNORMAL HIGH (ref 11.5–15.5)
WBC Count: 7 10*3/uL (ref 4.0–10.5)
nRBC: 0 % (ref 0.0–0.2)

## 2022-01-24 LAB — SAMPLE TO BLOOD BANK

## 2022-01-24 MED ORDER — SODIUM CHLORIDE 0.9% FLUSH
10.0000 mL | Freq: Once | INTRAVENOUS | Status: AC
  Administered 2022-01-24: 10 mL

## 2022-01-24 MED ORDER — HEPARIN SOD (PORK) LOCK FLUSH 100 UNIT/ML IV SOLN
500.0000 [IU] | Freq: Once | INTRAVENOUS | Status: AC
  Administered 2022-01-24: 500 [IU]

## 2022-01-24 NOTE — Progress Notes (Signed)
Critical lab Value:  Pt's plts 31 today.  Dr. Burr Medico notified.

## 2022-01-24 NOTE — Telephone Encounter (Signed)
CRITICAL VALUE STICKER  CRITICAL VALUE: HGB-8.4, PLT-31  RECEIVER (on-site recipient of call): Aysen Shieh  DATE & TIME NOTIFIED: 01/24/22 9:15 am  MESSENGER (representative from lab): Tomi Bamberger  MD NOTIFIED: Dr. Burr Medico  TIME OF NOTIFICATION: 9:15 am  RESPONSE:  Sample to Blood Bank

## 2022-01-27 DIAGNOSIS — R69 Illness, unspecified: Secondary | ICD-10-CM | POA: Diagnosis not present

## 2022-01-27 DIAGNOSIS — R52 Pain, unspecified: Secondary | ICD-10-CM | POA: Diagnosis not present

## 2022-01-27 DIAGNOSIS — D649 Anemia, unspecified: Secondary | ICD-10-CM | POA: Diagnosis not present

## 2022-01-27 DIAGNOSIS — Z9889 Other specified postprocedural states: Secondary | ICD-10-CM | POA: Diagnosis not present

## 2022-01-27 DIAGNOSIS — C9310 Chronic myelomonocytic leukemia not having achieved remission: Secondary | ICD-10-CM | POA: Diagnosis not present

## 2022-01-27 DIAGNOSIS — Z79891 Long term (current) use of opiate analgesic: Secondary | ICD-10-CM | POA: Diagnosis not present

## 2022-01-27 DIAGNOSIS — Z79899 Other long term (current) drug therapy: Secondary | ICD-10-CM | POA: Diagnosis not present

## 2022-01-28 ENCOUNTER — Other Ambulatory Visit: Payer: Self-pay | Admitting: Hematology

## 2022-01-28 MED FILL — Dexamethasone Sodium Phosphate Inj 100 MG/10ML: INTRAMUSCULAR | Qty: 1 | Status: AC

## 2022-01-30 NOTE — Progress Notes (Unsigned)
[place]   [phone] [phone]   Clinic Follow up Note   Patient Care Team: [name], DO as PCP - General (Family Medicine) Loletha Carrow Kirke Corin, MD as Consulting Physician (Gastroenterology) Truitt Merle, MD as Consulting Physician (Hematology) Alda Berthold, DO as Consulting Physician (Neurology) 01/31/2022  CHIEF COMPLAINT: Follow up [name], severe thrombocytopenia  SUMMARY OF ONCOLOGIC HISTORY: Oncology History  [name] (chronic myelomonocytic leukemia) ([place])  10/29/2020 Imaging   CT CAP  IMPRESSION: 1. Splenomegaly with pathologically enlarged lymph nodes above and below the diaphragm, with overall stable to minimally increased abdominal adenopathy and interval progression of the pelvic adenopathy. 2. Small volume abdominopelvic ascites with diffuse mesenteric stranding. 3. Scattered bilateral pulmonary micro nodules measuring 1-2 mm. 4. Distended gallbladder with some layering hyperdense material representing layering sludge and tiny stones seen on prior ultrasound. 5. Aortic atherosclerosis.   10/30/2020 Pathology Results   DIAGNOSIS:   BONE MARROW, ASPIRATE, CLOT, CORE:  -  Hypercellular bone marrow with panhyperplasia, atypia, and no  increase in blasts  -  See comment   PERIPHERAL BLOOD:  -  Marked thrombocytopenia  -  Absolute monocytosis  -  Normocytic anemia  -  See CBC data and comment   COMMENT:  The bone marrow is hypercellular for age (approximately 80%) with myeloid hyperplasia with maturational left shift, erythroid hyperplasia, and increased megakaryocytes.  Mild multilineage atypia is present. Blasts are not increased on aspirate smears (1% by manual differential count) or by CD34 immunohistochemistry on the core biopsy.  Concurrent flow cytometric analysis of the bone marrow aspirate demonstrates increased monocytes, and no increase in blasts or abnormal lymphoid population (see [id number]).  Monocytes are also  increased in peripheral  blood, persistent per electronic medical record.  In aggregate, the  findings raise the possibility of a myeloid neoplasm with the  differential diagnosis including a low-grade myelodysplastic syndrome and chronic myelomonocytic leukemia (dysplastic type).    ADDENDUM:  A reticulin special stain performed on the bone marrow core biopsy reveals no significant increase in reticulin fibrosis.  ADDENDUM:  CYTOGENETIC RESULTS:  Karyotype: 46,XX[20]  Interpretation: NORMAL FEMALE KARYOTYPE   FISH RESULTS:  Results: NORMAL   ADDENDUM:  CD123 immunohistochemistry performed on the core biopsy highlights scattered aggregates of positively staining cells consistent with plasmacytoid dendritic cells.    10/30/2020 Pathology Results   DIAGNOSIS:   BONE MARROW; FLOW CYTOMETRIC ANALYSIS:  -  Increased monocytes  -  Scant B-cells present  -  No immunophenotypically aberrant T-cell population identified  -  No increase in blasts  -  See comment   COMMENT:  Monocytes are relatively increased (12% of all cells), without aberrant expression of CD56.  B-cells comprise <1% of total lymphocytes. CD34-positive blasts are not increased (<1% of all cells).  Correlation with concurrent morphology is recommended for complete diagnostic interpretation and overall blast enumeration (see [id number]).    10/30/2020 Pathology Results   FINAL MICROSCOPIC DIAGNOSIS:   A. LYMPH NODE, RIGHT AXILLARY, NEEDLE CORE BIOPSY:  -Lymphoid tissue present  -See comment   COMMENT:  The sections show several small needle core biopsy fragments of lymphoid tissue displaying degenerative cellular changes/necrosis and hence cannot be accurately evaluated.  Sample for flow cytometric analysis not available.  Immunohistochemical stain for CD3 and CD20 were performed with appropriate controls.  There is a mixture of T and B cells in their apparently respective compartments.  There is no definite metastatic  malignancy.    11/11/2020 Pathology Results  DIAGNOSIS:   LEFT AXILLARY LYMPH NODE EXCISIONAL BIOPSY; FLOW CYTOMETRIC ANALYSIS:  -  No monotypic B-cell or immunophenotypically aberrant T-cell  population identified  -  See comment   COMMENT:  Flow cytometric analysis identifies B-cells with a normal kappa:lambda ratio of 1.7:1.  A subset of the polytypic B-cells expresses CD10. T-cells show a CD4:CD8 ratio of 3.3:1 without immunophenotypic aberrancy with the markers evaluated.  Although these results do not support the diagnosis of a clonal lymphoid population, sampling issues must always be considered when negative results are obtained, as focal lesions may not be represented in the specimen submitted.  In addition, flow cytometric immunophenotyping will not exclude other pathology if present (e.g. Hodgkin lymphoma, some T-cell lymphomas, metastatic and infectious diseases).   11/11/2020 Pathology Results   FINAL MICROSCOPIC DIAGNOSIS:   A. LYMPH NODE, LEFT AXILLARY, DISSECTION:  -  Follicular hyperplasia with interfollicular expansion  -  See comment   COMMENT:  Sections of the lymph nodes reveal generally preserved lymph node architecture.  There is follicular hyperplasia with some follicles showing increased hyalinization of germinal centers and mild concentric encircling of germinal centers with small lymphocytes (onion skinning). Interfollicular areas are expanded with increased vascular proliferation, small lymphocytes, plasma cells, histiocytic cells, and patchy neutrophils.  The lymph node capsule is variably thickened by fibrosis with few plasma cells.   A panel of immunohistochemical stains is performed for further  characterization.  CD3 and CD20/PAX5 highlight T-cell and B-cell  compartments, respectively.  Germinal centers are highlighted by CD10 and BCL6 with appropriate absent expression of BCL2.  CD5 is similar to CD3.  CD43 also stains the T-cells.  CD4-positive T-cells exceed  CD8-positive T-cells.  CD30 highlights scattered immunoblasts.  CD21 reveals intact follicular dendritic cell meshworks.  CD68 highlights increased histiocytic cells.  HHV 8 is negative.  T. pallidum reveals no definitive organisms.  Pancytokeratin is negative.  TdT stains rare cells.  CD138 highlights plasma cells which are not increased in number and show polytypic light chain expression by kappa/lambda in situ  hybridization.  EBV is negative by in situ hybridization.   Concurrent flow cytometric analysis is negative for a monoclonal B-cell or immunophenotypically aberrant T-cell population (see [id number]).   Together, the findings above are non-specific and can be seen in  reactive and infectious processes as well as Castleman disease.  There is no definitive morphologic or flow cytometric evidence of involvement by a lymphoproliferative disorder from the current workup.   12/17/2020 Initial Diagnosis   [name] (chronic myelomonocytic leukemia) ([place])   12/28/2020 -  Chemotherapy   Patient is on Treatment Plan : MYELODYSPLASIA  Azacitidine IV D1-5 q28d     03/18/2021 Pathology Results   Final Diagnosis    BONE MARROW:             Persistent chronic myelomonocytic leukemia in a hypercellular bone marrow with increased monocytes and no increase in blasts.   Comment    Flow cytometric analysis showed no increase in blasts and 17% monocytes.A next generation myeloid panel showed the presence of the following mutations: NRAS, SH2B3, PHF6 and TET2. CD34 and CD117 immunstains do not show an increase in blasts. The findings are consistent with persistent [name] with a decrease in the number of blasts compared to that seen on the previous bone marrow.    Final Interpretation      BONE MARROW; FLOW CYTOMETRIC ANALYSIS:   No increased blasts identified. (see comment)  Monocytosis (17% of total events).    COMMENT:  Flow cytometry identified about 0.6% of total events as immature cells. These cells  express CD45 (dim), CD34, CD117, HLA-DR, CD38, CD33 (variable). This immunophenotype is consistent with normal myeloblasts. In addition, monocytes are increased and comprise of approximately 17% of total events. These monocytes show normal expression of CD33/CD64/CD14/CD11b with variable expression of CD4. Correlation with concurrent morphology and clinical data is recommended (see [id number]).    FLOW CYTOMETRY ANALYSIS: CD45 versus side scatter analysis demonstrate a predominance of granulocytes (~70%), monocytes (~17%) and lymphocytes (~6%). No significant increase in blasts identified. All tested markers were used for adequate analysis of the cells and were appropriately reviewed.       CURRENT THERAPY:  Azacitidine days 1-5 q28 days, started 12/28/20 Nplate 54mcg/kg weekly, dose reduced since 07/19/21 when plt normalized, now 5 mcg/kg Platelet transfusion as needed (plt<15K), blood transfusion if Hg</= 7.5  INTERVAL HISTORY: Kathleen Gibson returns for follow up as scheduled. Last seen by Dr. Burr Medico 01/03/22 and completed another cycle of Vidaza, N plate was held due to good response, until plt dropped to 37K and she resumed injection 8/9. She saw Dr. Linus Orn on 8/17 who agreed to continue Nplate and vidaza, a bone marrow biopsy was not done.  She is doing well overall, tolerates treatment with fatigue during the week of injections and for a few days after, then recovers.  She plans to go to [place] this weekend and stay until next treatment.  All other systems were reviewed with the patient and are negative.  MEDICAL HISTORY:  Past Medical History:  Diagnosis Date   Diabetes ([place])    HLD (hyperlipidemia)    Hypertension    Pernicious anemia    Pneumonia 09/05/2019   Renal insufficiency 09/06/2019   Vitamin D deficiency     SURGICAL HISTORY: Past Surgical History:  Procedure Laterality Date   ABDOMINAL HERNIA REPAIR  2009   ABDOMINOPLASTY     AXILLARY LYMPH NODE BIOPSY Left 11/11/2020    Procedure: LEFT AXILLARY EXCISIONAL LYMPH NODE BIOPSY;  Surgeon: [name], MD;  Location: [facility];  Service: General;  Laterality: Left;   [place]   GASTRIC BYPASS  2000   hemorroid surgery  2007   IR IMAGING GUIDED PORT INSERTION  07/26/2021   IR KYPHO EA ADDL LEVEL THORACIC OR LUMBAR  05/31/2021   IR KYPHO THORACIC WITH BONE BIOPSY  05/31/2021   TONSILLECTOMY AND ADENOIDECTOMY  1957    I have reviewed the social history and family history with the patient and they are unchanged from previous note.  ALLERGIES:  is allergic to asa [aspirin] and nsaids.  MEDICATIONS:  Current Outpatient Medications  Medication Sig Dispense Refill   cyanocobalamin (,VITAMIN B-12,) 1000 MCG/ML injection Inject 1,000 mcg into the muscle every 30 (thirty) days.     cyclobenzaprine (FLEXERIL) 5 MG tablet Take 1 tablet (5 mg total) by mouth 3 (three) times daily as needed for muscle spasms. 30 tablet 0   furosemide (LASIX) 20 MG tablet TAKE 1 TABLET (20 MG TOTAL) BY MOUTH DAILY. FOR UP TO 7 DAYS THEN AS NEEDED FOR LEG EDEMA 30 tablet 0   glucose blood (ONETOUCH VERIO) test strip Monitor blood sugars twice daily 100 each 5   KLOR-CON M10 10 MEQ tablet TAKE 1 TABLET (10 MEQ TOTAL) BY MOUTH 2 (TWO) TIMES DAILY. TAKE ALONG WITH LASIX ONLY 30 tablet 0   Lancets (ONETOUCH ULTRASOFT) lancets Monitor blood sugars twice daily 100 each 5   metoprolol succinate (TOPROL-XL) 25 MG  24 hr tablet Take 1 tablet (25 mg total) by mouth daily. 90 tablet 1   naloxone (NARCAN) nasal spray 4 mg/0.1 mL Place 1 spray into the nose once as needed (overdose). (Patient not taking: Reported on 12/01/2021) 2 each 0   Oxycodone HCl 10 MG TABS 1 tab every 4-6 hr prn for chronic pain 120 tablet 0   Oxycodone HCl 10 MG TABS 1 tab every 4-6 hours as needed for pain 120 tablet 0   polyethylene glycol powder (GLYCOLAX/MIRALAX) 17 GM/SCOOP powder Dissolve 1 capful (17 g) in water and drink 2 (two) times daily. (Patient taking  differently: Take 17 g by mouth daily.) 510 g 2   pregabalin (LYRICA) 150 MG capsule Take 1 capsule (150 mg total) by mouth 2 (two) times daily. 180 capsule 1   senna-docusate (SENOKOT-S) 8.6-50 MG tablet Take 2 tablets by mouth at bedtime. (Patient taking differently: Take 2 tablets by mouth at bedtime as needed for mild constipation.) 60 tablet 2   sitaGLIPtin-metformin (JANUMET) 50-1000 MG tablet Take 1 tablet by mouth at bedtime. 90 tablet 1   No current facility-administered medications for this visit.   Facility-Administered Medications Ordered in Other Visits  Medication Dose Route Frequency Provider Last Rate Last Admin   0.9 %  sodium chloride infusion (Manually program via Guardrails IV Fluids)  250 mL Intravenous Once Truitt Merle, MD        PHYSICAL EXAMINATION: ECOG PERFORMANCE STATUS: 1 - Symptomatic but completely ambulatory  Vitals:   01/31/22 1105  BP: 120/68  Pulse: 90  Resp: 18  Temp: 98.2 F (36.8 C)  SpO2: 100%   Filed Weights   01/31/22 1105  Weight: 148 lb 4.8 oz (67.3 kg)    GENERAL:alert, no distress and comfortable SKIN: No rash EYES: sclera clear LUNGS:  normal breathing effort HEART: no lower extremity edema NEURO: alert & oriented x 3 with fluent speech, no focal motor/sensory deficits PAC without erythema  LABORATORY DATA:  I have reviewed the data as listed    Latest Ref Rng & Units 01/31/2022   10:31 AM 01/24/2022    8:42 AM 01/17/2022    8:30 AM  CBC  WBC 4.0 - 10.5 K/uL 3.8  7.0  8.4   Hemoglobin 12.0 - 15.0 g/dL 8.2  8.4  9.0   Hematocrit 36.0 - 46.0 % 26.2  26.6  27.9   Platelets 150 - 400 K/uL 69  31  37         Latest Ref Rng & Units 01/31/2022   10:31 AM 01/17/2022    8:30 AM 01/03/2022    8:36 AM  CMP  Glucose 70 - 99 mg/dL 249  230  207   BUN 8 - 23 mg/dL $Remove'11  15  13   'ZIedxzX$ Creatinine 0.44 - 1.00 mg/dL 0.62  0.60  0.62   Sodium 135 - 145 mmol/L 137  139  139   Potassium 3.5 - 5.1 mmol/L 4.0  4.1  4.1   Chloride 98 - 111 mmol/L 107   108  108   CO2 22 - 32 mmol/L $RemoveB'27  26  26   'PAQXZmwo$ Calcium 8.9 - 10.3 mg/dL 8.5  8.2  8.5   Total Protein 6.5 - 8.1 g/dL 6.2  6.5  6.2   Total Bilirubin 0.3 - 1.2 mg/dL 0.9  0.9  0.7   Alkaline Phos 38 - 126 U/L 103  96  116   AST 15 - 41 U/L 17  20  18  ALT 0 - 44 U/L $Remo'17  18  16       'ufNFQ$ RADIOGRAPHIC STUDIES: I have personally reviewed the radiological images as listed and agreed with the findings in the report. No results found.   ASSESSMENT & PLAN: Kathleen Gibson is a 71 y.o. female with   1. [name] with severe thrombocytopenia  -Initially diagnosed with ITP in 2014. She lost follow up after 11/2017 and did not proceed with recommended bone marrow biopsy. -She was referred to ED 10/28/20 by her PCP for low plt count of 4k. She was hospitalized and treated with platelet transfusions, IVIG, oral prednisone $RemoveBeforeD'60mg'lFQdIyLIScoWjj$  and Nplate injections. She tapered off prednisone given little response. -Bone marrow biopsy 10/30/20 felt to likely represent MDS/MPN, particularly [name].  Confirmed on slide review at [facility] by Dr. Linus Orn -She began treatment for severe refractory thrombocytopenia with weekly Rituxan x4 on 11/19/20, she did not respond  -She began azacitidine daily days 1-5 q. 28 days on 12/28/2020 -She had worsening thrombocytopenia after stopping Nplate, restarted weekly Nplate 10 mcg/kg on 0/81/4481.  Dose adjustment per platelet level, currently 5 mcg/kg -Bone marrow biopsy 03/18/2021 showed stable disease, no increase in blasts. -She continues azacitidine daily days 1-5 q. 28 days.  -Kathleen Gibson appears stable, she continues Vidaza days 1-5 every 28 days, tolerating well with fatigue.  Side effects are well managed with supportive care at home.  She is able to recover and function well.  There is no clinical evidence of disease progression -Labs reviewed, Hgb 8.2, platelets 69 improving back on Nplate.  -Adequate to proceed with another cycle of Vidaza days 1-5 q. 28 days, starting today.  She was  seen by Dr. Linus Orn recently who agrees to continue same regimen -She will travel to [place] this month and will receive lab with Nplate weekly, I will fax resorts and today's note -Follow-up in 4 weeks with next cycle   2.  T7/T8 compression fracture -Platelet count improved, she was able to undergo T10 and T12 kyphoplasty 05/31/2021  -No longer using thoracic brace -Pain managed on rare use of oxycodone -Restart calcium and vitamin D -She has not been able to find a dentist who takes Medicare for Zometa clearance, I previously referred her to [facility], [place]. Zometa remains on hold  3. Normocytic anemia, moderate  -H/o pernicious anemia/vitamin B12 deficiency -Transfuse for Hgb </= 7.5 -Continue oral iron  -last B12 level 2654 on 8/7, hold injection today   4. Type 2 diabetes mellitus with polyneuropathy, Hypertension, anxiety -Continue medication, follow-up PCP -Ativan as needed  PLAN: -Labs reviewed -proceed with Vidaza days 1-5, continue q28 days -Nplate today, continue weekly 5 mcg/kg (will cc note and send orders to [place] for lab and Nplate 5 mcg/kg on 8/56, 9/5, and 9/12) -Hold B12 injection today, last level 01/17/22 >2000. Takes oral B12 -F/up in 4 weeks with next cycle -Transfusion parameters: Transfuse for platelet <15K and for hemoglobin </=less 7.5-8 or symptomatic   All questions were answered. The patient knows to call the clinic with any problems, questions or concerns. No barriers to learning were detected.      Alla Feeling, NP 01/31/22

## 2022-01-31 ENCOUNTER — Other Ambulatory Visit: Payer: Self-pay

## 2022-01-31 ENCOUNTER — Inpatient Hospital Stay: Payer: Medicare HMO

## 2022-01-31 ENCOUNTER — Encounter: Payer: Self-pay | Admitting: Nurse Practitioner

## 2022-01-31 ENCOUNTER — Inpatient Hospital Stay (HOSPITAL_BASED_OUTPATIENT_CLINIC_OR_DEPARTMENT_OTHER): Payer: Medicare HMO | Admitting: Nurse Practitioner

## 2022-01-31 VITALS — BP 120/68 | HR 90 | Temp 98.2°F | Resp 18 | Ht 63.0 in | Wt 148.3 lb

## 2022-01-31 DIAGNOSIS — Z95828 Presence of other vascular implants and grafts: Secondary | ICD-10-CM

## 2022-01-31 DIAGNOSIS — Z7984 Long term (current) use of oral hypoglycemic drugs: Secondary | ICD-10-CM | POA: Diagnosis not present

## 2022-01-31 DIAGNOSIS — C9310 Chronic myelomonocytic leukemia not having achieved remission: Secondary | ICD-10-CM

## 2022-01-31 DIAGNOSIS — D696 Thrombocytopenia, unspecified: Secondary | ICD-10-CM

## 2022-01-31 DIAGNOSIS — E1142 Type 2 diabetes mellitus with diabetic polyneuropathy: Secondary | ICD-10-CM | POA: Diagnosis not present

## 2022-01-31 DIAGNOSIS — D693 Immune thrombocytopenic purpura: Secondary | ICD-10-CM | POA: Diagnosis not present

## 2022-01-31 DIAGNOSIS — Z79899 Other long term (current) drug therapy: Secondary | ICD-10-CM | POA: Diagnosis not present

## 2022-01-31 DIAGNOSIS — Z5111 Encounter for antineoplastic chemotherapy: Secondary | ICD-10-CM | POA: Diagnosis not present

## 2022-01-31 LAB — CBC WITH DIFFERENTIAL (CANCER CENTER ONLY)
Abs Immature Granulocytes: 0.03 10*3/uL (ref 0.00–0.07)
Basophils Absolute: 0.1 10*3/uL (ref 0.0–0.1)
Basophils Relative: 1 %
Eosinophils Absolute: 0 10*3/uL (ref 0.0–0.5)
Eosinophils Relative: 1 %
HCT: 26.2 % — ABNORMAL LOW (ref 36.0–46.0)
Hemoglobin: 8.2 g/dL — ABNORMAL LOW (ref 12.0–15.0)
Immature Granulocytes: 1 %
Lymphocytes Relative: 14 %
Lymphs Abs: 0.5 10*3/uL — ABNORMAL LOW (ref 0.7–4.0)
MCH: 25.9 pg — ABNORMAL LOW (ref 26.0–34.0)
MCHC: 31.3 g/dL (ref 30.0–36.0)
MCV: 82.6 fL (ref 80.0–100.0)
Monocytes Absolute: 1 10*3/uL (ref 0.1–1.0)
Monocytes Relative: 25 %
Neutro Abs: 2.3 10*3/uL (ref 1.7–7.7)
Neutrophils Relative %: 58 %
Platelet Count: 69 10*3/uL — ABNORMAL LOW (ref 150–400)
RBC: 3.17 MIL/uL — ABNORMAL LOW (ref 3.87–5.11)
RDW: 20.2 % — ABNORMAL HIGH (ref 11.5–15.5)
WBC Count: 3.8 10*3/uL — ABNORMAL LOW (ref 4.0–10.5)
nRBC: 0 % (ref 0.0–0.2)

## 2022-01-31 LAB — COMPREHENSIVE METABOLIC PANEL
ALT: 17 U/L (ref 0–44)
AST: 17 U/L (ref 15–41)
Albumin: 3.4 g/dL — ABNORMAL LOW (ref 3.5–5.0)
Alkaline Phosphatase: 103 U/L (ref 38–126)
Anion gap: 3 — ABNORMAL LOW (ref 5–15)
BUN: 11 mg/dL (ref 8–23)
CO2: 27 mmol/L (ref 22–32)
Calcium: 8.5 mg/dL — ABNORMAL LOW (ref 8.9–10.3)
Chloride: 107 mmol/L (ref 98–111)
Creatinine, Ser: 0.62 mg/dL (ref 0.44–1.00)
GFR, Estimated: >60 mL/min (ref >60)
Glucose, Bld: 249 mg/dL — ABNORMAL HIGH (ref 70–99)
Potassium: 4 mmol/L (ref 3.5–5.1)
Sodium: 137 mmol/L (ref 135–145)
Total Bilirubin: 0.9 mg/dL (ref 0.3–1.2)
Total Protein: 6.2 g/dL — ABNORMAL LOW (ref 6.5–8.1)

## 2022-01-31 LAB — SAMPLE TO BLOOD BANK

## 2022-01-31 MED ORDER — SODIUM CHLORIDE 0.9 % IV SOLN: Freq: Once | INTRAVENOUS | Status: AC

## 2022-01-31 MED ORDER — PALONOSETRON HCL INJECTION 0.25 MG/5ML
0.2500 mg | Freq: Once | INTRAVENOUS | Status: AC
  Administered 2022-01-31: 0.25 mg via INTRAVENOUS
  Filled 2022-01-31: qty 5

## 2022-01-31 MED ORDER — ROMIPLOSTIM INJECTION 500 MCG
340.0000 ug | Freq: Once | SUBCUTANEOUS | Status: AC
  Administered 2022-01-31: 340 ug via SUBCUTANEOUS
  Filled 2022-01-31: qty 0.68

## 2022-01-31 MED ORDER — HEPARIN SOD (PORK) LOCK FLUSH 100 UNIT/ML IV SOLN
500.0000 [IU] | Freq: Once | INTRAVENOUS | Status: AC | PRN
  Administered 2022-01-31: 500 [IU]

## 2022-01-31 MED ORDER — SODIUM CHLORIDE 0.9 % IV SOLN
10.0000 mg | Freq: Once | INTRAVENOUS | Status: AC
  Administered 2022-01-31: 10 mg via INTRAVENOUS
  Filled 2022-01-31: qty 10

## 2022-01-31 MED ORDER — SODIUM CHLORIDE 0.9% FLUSH
10.0000 mL | INTRAVENOUS | Status: DC | PRN
  Administered 2022-01-31: 10 mL

## 2022-01-31 MED ORDER — SODIUM CHLORIDE 0.9% FLUSH
10.0000 mL | Freq: Once | INTRAVENOUS | Status: AC
  Administered 2022-01-31: 10 mL

## 2022-01-31 MED ORDER — CYANOCOBALAMIN 1000 MCG/ML IJ SOLN: 1000.0000 ug | Freq: Once | INTRAMUSCULAR | Status: DC

## 2022-01-31 MED ORDER — SODIUM CHLORIDE 0.9 % IV SOLN
75.0000 mg/m2 | Freq: Once | INTRAVENOUS | Status: AC
  Administered 2022-01-31: 135 mg via INTRAVENOUS
  Filled 2022-01-31: qty 13.5

## 2022-01-31 MED FILL — Dexamethasone Sodium Phosphate Inj 100 MG/10ML: INTRAMUSCULAR | Qty: 1 | Status: AC

## 2022-01-31 NOTE — Patient Instructions (Signed)
[facility] MEDICAL [facility]COLOGY  Discharge Instructions: Thank you for choosing [facility] to provide your oncology and hematology care.   If you have a lab appointment with the Cancer Center, please go directly to the Cancer Center and check in at the registration area.   Wear comfortable clothing and clothing appropriate for easy access to any Portacath or PICC line.   We strive to give you quality time with your provider. You may need to reschedule your appointment if you arrive late (15 or more minutes).  Arriving late affects you and other patients whose appointments are after yours.  Also, if you miss three or more appointments without notifying the office, you may be dismissed from the clinic at the provider's discretion.      For prescription refill requests, have your pharmacy contact our office and allow 72 hours for refills to be completed.    Today you received the following chemotherapy and/or immunotherapy agents: Vidaza      To help prevent nausea and vomiting after your treatment, we encourage you to take your nausea medication as directed.  BELOW ARE SYMPTOMS THAT SHOULD BE REPORTED IMMEDIATELY: *FEVER GREATER THAN 100.4 F (38 C) OR HIGHER *CHILLS OR SWEATING *NAUSEA AND VOMITING THAT IS NOT CONTROLLED WITH YOUR NAUSEA MEDICATION *UNUSUAL SHORTNESS OF BREATH *UNUSUAL BRUISING OR BLEEDING *URINARY PROBLEMS (pain or burning when urinating, or frequent urination) *BOWEL PROBLEMS (unusual diarrhea, constipation, pain near the anus) TENDERNESS IN MOUTH AND THROAT WITH OR WITHOUT PRESENCE OF ULCERS (sore throat, sores in mouth, or a toothache) UNUSUAL RASH, SWELLING OR PAIN  UNUSUAL VAGINAL DISCHARGE OR ITCHING   Items with * indicate a potential emergency and should be followed up as soon as possible or go to the Emergency Department if any problems should occur.  Please show the CHEMOTHERAPY ALERT CARD or IMMUNOTHERAPY ALERT CARD at check-in to the  Emergency Department and triage nurse.  Should you have questions after your visit or need to cancel or reschedule your appointment, please contact [facility]ICAL [facility]COL[facility]  Dept: [phone]  and follow the prompts.  Office hours are 8:00 a.m. to 4:30 p.m. Monday - Friday. Please note that voicemails left after 4:00 p.m. may not be returned until the following business day.  We are closed weekends and major holidays. You have access to a nurse at all times for urgent questions. Please call the main number to the clinic Dept: [phone] and follow the prompts.   For any non-urgent questions, you may also contact your provider using MyChart. We now offer e-Visits for anyone 18 and older to request care online for non-urgent symptoms. For details visit [email or web address].   Also download the MyChart app! Go to the app store, search "MyChart", open the app, select [facility], and log in with your MyChart username and password.  Masks are optional in the cancer centers. If you would like for your care team to wear a mask while they are taking care of you, please let them know. You may have one support person who is at least 70 years old accompany you for your appointments. 

## 2022-02-01 ENCOUNTER — Other Ambulatory Visit: Payer: Self-pay

## 2022-02-01 ENCOUNTER — Inpatient Hospital Stay: Payer: Medicare HMO

## 2022-02-01 VITALS — BP 115/49 | HR 87 | Temp 98.1°F | Resp 18

## 2022-02-01 DIAGNOSIS — C9310 Chronic myelomonocytic leukemia not having achieved remission: Secondary | ICD-10-CM

## 2022-02-01 DIAGNOSIS — E1142 Type 2 diabetes mellitus with diabetic polyneuropathy: Secondary | ICD-10-CM | POA: Diagnosis not present

## 2022-02-01 DIAGNOSIS — Z79899 Other long term (current) drug therapy: Secondary | ICD-10-CM | POA: Diagnosis not present

## 2022-02-01 DIAGNOSIS — Z5111 Encounter for antineoplastic chemotherapy: Secondary | ICD-10-CM | POA: Diagnosis not present

## 2022-02-01 DIAGNOSIS — Z7984 Long term (current) use of oral hypoglycemic drugs: Secondary | ICD-10-CM | POA: Diagnosis not present

## 2022-02-01 DIAGNOSIS — D693 Immune thrombocytopenic purpura: Secondary | ICD-10-CM | POA: Diagnosis not present

## 2022-02-01 MED ORDER — SODIUM CHLORIDE 0.9 % IV SOLN
75.0000 mg/m2 | Freq: Once | INTRAVENOUS | Status: AC
  Administered 2022-02-01: 135 mg via INTRAVENOUS
  Filled 2022-02-01: qty 13.5

## 2022-02-01 MED ORDER — SODIUM CHLORIDE 0.9% FLUSH
10.0000 mL | INTRAVENOUS | Status: DC | PRN
  Administered 2022-02-01: 10 mL

## 2022-02-01 MED ORDER — SODIUM CHLORIDE 0.9 % IV SOLN
10.0000 mg | Freq: Once | INTRAVENOUS | Status: AC
  Administered 2022-02-01: 10 mg via INTRAVENOUS
  Filled 2022-02-01: qty 10

## 2022-02-01 MED ORDER — SODIUM CHLORIDE 0.9 % IV SOLN: Freq: Once | INTRAVENOUS | Status: AC

## 2022-02-01 MED ORDER — HEPARIN SOD (PORK) LOCK FLUSH 100 UNIT/ML IV SOLN
500.0000 [IU] | Freq: Once | INTRAVENOUS | Status: AC | PRN
  Administered 2022-02-01: 500 [IU]

## 2022-02-01 MED FILL — Dexamethasone Sodium Phosphate Inj 100 MG/10ML: INTRAMUSCULAR | Qty: 1 | Status: AC

## 2022-02-01 NOTE — Patient Instructions (Signed)
[facility]ICAL [facility]COL[facility]  Discharge Instructions: Thank you for choosing [facility] to provide your oncology and hematology care.   If you have a lab appointment with the Cancer Center, please go directly to the Cancer Center and check in at the registration area.   Wear comfortable clothing and clothing appropriate for easy access to any Portacath or PICC line.   We strive to give you quality time with your provider. You may need to reschedule your appointment if you arrive late (15 or more minutes).  Arriving late affects you and other patients whose appointments are after yours.  Also, if you miss three or more appointments without notifying the office, you may be dismissed from the clinic at the provider's discretion.      For prescription refill requests, have your pharmacy contact our office and allow 72 hours for refills to be completed.    Today you received the following chemotherapy and/or immunotherapy agents: Vidaza      To help prevent nausea and vomiting after your treatment, we encourage you to take your nausea medication as directed.  BELOW ARE SYMPTOMS THAT SHOULD BE REPORTED IMMEDIATELY: *FEVER GREATER THAN 100.4 F (38 C) OR HIGHER *CHILLS OR SWEATING *NAUSEA AND VOMITING THAT IS NOT CONTROLLED WITH YOUR NAUSEA MEDICATION *UNUSUAL SHORTNESS OF BREATH *UNUSUAL BRUISING OR BLEEDING *URINARY PROBLEMS (pain or burning when urinating, or frequent urination) *BOWEL PROBLEMS (unusual diarrhea, constipation, pain near the anus) TENDERNESS IN MOUTH AND THROAT WITH OR WITHOUT PRESENCE OF ULCERS (sore throat, sores in mouth, or a toothache) UNUSUAL RASH, SWELLING OR PAIN  UNUSUAL VAGINAL DISCHARGE OR ITCHING   Items with * indicate a potential emergency and should be followed up as soon as possible or go to the Emergency Department if any problems should occur.  Please show the CHEMOTHERAPY ALERT CARD or IMMUNOTHERAPY ALERT CARD at check-in to the  Emergency Department and triage nurse.  Should you have questions after your visit or need to cancel or reschedule your appointment, please contact [facility]ICAL ONCOL[facility]  Dept: [phone]  and follow the prompts.  Office hours are 8:00 a.m. to 4:30 p.m. Monday - Friday. Please note that voicemails left after 4:00 p.m. may not be returned until the following business day.  We are closed weekends and major holidays. You have access to a nurse at all times for urgent questions. Please call the main number to the clinic Dept: [phone] and follow the prompts.   For any non-urgent questions, you may also contact your provider using MyChart. We now offer e-Visits for anyone 18 and older to request care online for non-urgent symptoms. For details visit [email or web address].   Also download the MyChart app! Go to the app store, search "MyChart", open the app, select [facility], and log in with your MyChart username and password.  Masks are optional in the cancer centers. If you would like for your care team to wear a mask while they are taking care of you, please let them know. You may have one support person who is at least 70 years old accompany you for your appointments. 

## 2022-02-01 NOTE — Progress Notes (Signed)
Faxed signed orders [facility].  Per Dr. Burr Medico since the pt got chemotherapy the week of 01/31/2022 she wants the NPLATE to be at 84ONG/EX * 67.3kg which equates to 623mg weekly.  Pt's platelets have been running low, therefore, Dr. FBurr Medicodoes not want the [place]'s [place]  Fax confirmation received.  Pt has appts w/[facility] on: 08/29; 09/05; 09/12

## 2022-02-02 ENCOUNTER — Inpatient Hospital Stay: Payer: Medicare HMO

## 2022-02-02 ENCOUNTER — Other Ambulatory Visit: Payer: Self-pay

## 2022-02-02 VITALS — BP 124/70 | HR 83 | Temp 97.9°F | Resp 17

## 2022-02-02 DIAGNOSIS — C9310 Chronic myelomonocytic leukemia not having achieved remission: Secondary | ICD-10-CM

## 2022-02-02 DIAGNOSIS — Z7984 Long term (current) use of oral hypoglycemic drugs: Secondary | ICD-10-CM | POA: Diagnosis not present

## 2022-02-02 DIAGNOSIS — E1142 Type 2 diabetes mellitus with diabetic polyneuropathy: Secondary | ICD-10-CM | POA: Diagnosis not present

## 2022-02-02 DIAGNOSIS — Z79899 Other long term (current) drug therapy: Secondary | ICD-10-CM | POA: Diagnosis not present

## 2022-02-02 DIAGNOSIS — Z5111 Encounter for antineoplastic chemotherapy: Secondary | ICD-10-CM | POA: Diagnosis not present

## 2022-02-02 DIAGNOSIS — D693 Immune thrombocytopenic purpura: Secondary | ICD-10-CM | POA: Diagnosis not present

## 2022-02-02 MED ORDER — SODIUM CHLORIDE 0.9 % IV SOLN
10.0000 mg | Freq: Once | INTRAVENOUS | Status: AC
  Administered 2022-02-02: 10 mg via INTRAVENOUS
  Filled 2022-02-02: qty 10

## 2022-02-02 MED ORDER — SODIUM CHLORIDE 0.9% FLUSH
10.0000 mL | INTRAVENOUS | Status: DC | PRN
  Administered 2022-02-02: 10 mL

## 2022-02-02 MED ORDER — SODIUM CHLORIDE 0.9 % IV SOLN: Freq: Once | INTRAVENOUS | Status: AC

## 2022-02-02 MED ORDER — HEPARIN SOD (PORK) LOCK FLUSH 100 UNIT/ML IV SOLN
500.0000 [IU] | Freq: Once | INTRAVENOUS | Status: AC | PRN
  Administered 2022-02-02: 500 [IU]

## 2022-02-02 MED ORDER — SODIUM CHLORIDE 0.9 % IV SOLN
75.0000 mg/m2 | Freq: Once | INTRAVENOUS | Status: AC
  Administered 2022-02-02: 135 mg via INTRAVENOUS
  Filled 2022-02-02: qty 13.5

## 2022-02-02 MED ORDER — PALONOSETRON HCL INJECTION 0.25 MG/5ML
0.2500 mg | Freq: Once | INTRAVENOUS | Status: AC
  Administered 2022-02-02: 0.25 mg via INTRAVENOUS
  Filled 2022-02-02: qty 5

## 2022-02-02 MED FILL — Dexamethasone Sodium Phosphate Inj 100 MG/10ML: INTRAMUSCULAR | Qty: 1 | Status: AC

## 2022-02-03 ENCOUNTER — Other Ambulatory Visit: Payer: Self-pay | Admitting: Hematology

## 2022-02-03 ENCOUNTER — Inpatient Hospital Stay: Payer: Medicare HMO

## 2022-02-03 ENCOUNTER — Telehealth: Payer: Self-pay | Admitting: Hematology

## 2022-02-03 VITALS — BP 125/71 | HR 95 | Temp 98.0°F | Resp 18

## 2022-02-03 DIAGNOSIS — C9310 Chronic myelomonocytic leukemia not having achieved remission: Secondary | ICD-10-CM

## 2022-02-03 DIAGNOSIS — E1142 Type 2 diabetes mellitus with diabetic polyneuropathy: Secondary | ICD-10-CM | POA: Diagnosis not present

## 2022-02-03 DIAGNOSIS — Z7984 Long term (current) use of oral hypoglycemic drugs: Secondary | ICD-10-CM | POA: Diagnosis not present

## 2022-02-03 DIAGNOSIS — Z5111 Encounter for antineoplastic chemotherapy: Secondary | ICD-10-CM | POA: Diagnosis not present

## 2022-02-03 DIAGNOSIS — Z79899 Other long term (current) drug therapy: Secondary | ICD-10-CM | POA: Diagnosis not present

## 2022-02-03 DIAGNOSIS — D693 Immune thrombocytopenic purpura: Secondary | ICD-10-CM | POA: Diagnosis not present

## 2022-02-03 MED ORDER — SODIUM CHLORIDE 0.9 % IV SOLN: Freq: Once | INTRAVENOUS | Status: AC

## 2022-02-03 MED ORDER — SODIUM CHLORIDE 0.9 % IV SOLN
75.0000 mg/m2 | Freq: Once | INTRAVENOUS | Status: AC
  Administered 2022-02-03: 135 mg via INTRAVENOUS
  Filled 2022-02-03: qty 13.5

## 2022-02-03 MED ORDER — SODIUM CHLORIDE 0.9 % IV SOLN
10.0000 mg | Freq: Once | INTRAVENOUS | Status: AC
  Administered 2022-02-03: 10 mg via INTRAVENOUS
  Filled 2022-02-03: qty 10

## 2022-02-03 MED ORDER — HEPARIN SOD (PORK) LOCK FLUSH 100 UNIT/ML IV SOLN
500.0000 [IU] | Freq: Once | INTRAVENOUS | Status: AC | PRN
  Administered 2022-02-03: 500 [IU]

## 2022-02-03 MED ORDER — SODIUM CHLORIDE 0.9% FLUSH
10.0000 mL | INTRAVENOUS | Status: DC | PRN
  Administered 2022-02-03: 10 mL

## 2022-02-03 MED FILL — Dexamethasone Sodium Phosphate Inj 100 MG/10ML: INTRAMUSCULAR | Qty: 1 | Status: AC

## 2022-02-03 NOTE — Patient Instructions (Signed)
[facility]  Discharge Instructions: Thank you for choosing [facility] to provide your oncology and hematology care.   If you have a lab appointment with the Cancer Center, please go directly to the Cancer Center and check in at the registration area.   Wear comfortable clothing and clothing appropriate for easy access to any Portacath or PICC line.   We strive to give you quality time with your provider. You may need to reschedule your appointment if you arrive late (15 or more minutes).  Arriving late affects you and other patients whose appointments are after yours.  Also, if you miss three or more appointments without notifying the office, you may be dismissed from the clinic at the provider's discretion.      For prescription refill requests, have your pharmacy contact our office and allow 72 hours for refills to be completed.    Today you received the following chemotherapy and/or immunotherapy agents: Vidaza      To help prevent nausea and vomiting after your treatment, we encourage you to take your nausea medication as directed.  BELOW ARE SYMPTOMS THAT SHOULD BE REPORTED IMMEDIATELY: *FEVER GREATER THAN 100.4 F (38 C) OR HIGHER *CHILLS OR SWEATING *NAUSEA AND VOMITING THAT IS NOT CONTROLLED WITH YOUR NAUSEA MEDICATION *UNUSUAL SHORTNESS OF BREATH *UNUSUAL BRUISING OR BLEEDING *URINARY PROBLEMS (pain or burning when urinating, or frequent urination) *BOWEL PROBLEMS (unusual diarrhea, constipation, pain near the anus) TENDERNESS IN MOUTH AND THROAT WITH OR WITHOUT PRESENCE OF ULCERS (sore throat, sores in mouth, or a toothache) UNUSUAL RASH, SWELLING OR PAIN  UNUSUAL VAGINAL DISCHARGE OR ITCHING   Items with * indicate a potential emergency and should be followed up as soon as possible or go to the Emergency Department if any problems should occur.  Please show the CHEMOTHERAPY ALERT CARD or IMMUNOTHERAPY ALERT CARD at check-in to the  Emergency Department and triage nurse.  Should you have questions after your visit or need to cancel or reschedule your appointment, please contact [facility]  Dept: [phone]  and follow the prompts.  Office hours are 8:00 a.m. to 4:30 p.m. Monday - Friday. Please note that voicemails left after 4:00 p.m. may not be returned until the following business day.  We are closed weekends and major holidays. You have access to a nurse at all times for urgent questions. Please call the main number to the clinic Dept: [phone] and follow the prompts.   For any non-urgent questions, you may also contact your provider using MyChart. We now offer e-Visits for anyone 18 and older to request care online for non-urgent symptoms. For details visit [email or web address].   Also download the MyChart app! Go to the app store, search "MyChart", open the app, select [facility], and log in with your MyChart username and password.  Masks are optional in the cancer centers. If you would like for your care team to wear a mask while they are taking care of you, please let them know. You may have one support person who is at least 70 years old accompany you for your appointments. 

## 2022-02-03 NOTE — Telephone Encounter (Signed)
Left message with follow-up appointments per 8/21 los.

## 2022-02-04 ENCOUNTER — Other Ambulatory Visit: Payer: Self-pay

## 2022-02-04 ENCOUNTER — Inpatient Hospital Stay (HOSPITAL_BASED_OUTPATIENT_CLINIC_OR_DEPARTMENT_OTHER): Payer: Medicare HMO | Admitting: Hematology

## 2022-02-04 VITALS — BP 144/80 | HR 92 | Temp 97.6°F | Resp 18

## 2022-02-04 DIAGNOSIS — Z79899 Other long term (current) drug therapy: Secondary | ICD-10-CM | POA: Diagnosis not present

## 2022-02-04 DIAGNOSIS — Z5111 Encounter for antineoplastic chemotherapy: Secondary | ICD-10-CM | POA: Diagnosis not present

## 2022-02-04 DIAGNOSIS — Z7984 Long term (current) use of oral hypoglycemic drugs: Secondary | ICD-10-CM | POA: Diagnosis not present

## 2022-02-04 DIAGNOSIS — D693 Immune thrombocytopenic purpura: Secondary | ICD-10-CM | POA: Diagnosis not present

## 2022-02-04 DIAGNOSIS — E1142 Type 2 diabetes mellitus with diabetic polyneuropathy: Secondary | ICD-10-CM | POA: Diagnosis not present

## 2022-02-04 DIAGNOSIS — C9310 Chronic myelomonocytic leukemia not having achieved remission: Secondary | ICD-10-CM | POA: Diagnosis not present

## 2022-02-04 MED ORDER — SODIUM CHLORIDE 0.9 % IV SOLN
10.0000 mg | Freq: Once | INTRAVENOUS | Status: AC
  Administered 2022-02-04: 10 mg via INTRAVENOUS
  Filled 2022-02-04: qty 10

## 2022-02-04 MED ORDER — SODIUM CHLORIDE 0.9% FLUSH
10.0000 mL | INTRAVENOUS | Status: DC | PRN
  Administered 2022-02-04: 10 mL

## 2022-02-04 MED ORDER — SODIUM CHLORIDE 0.9 % IV SOLN: 8.0000 mg | Freq: Once | INTRAVENOUS | Status: DC

## 2022-02-04 MED ORDER — SODIUM CHLORIDE 0.9 % IV SOLN: Freq: Once | INTRAVENOUS | Status: AC

## 2022-02-04 MED ORDER — SODIUM CHLORIDE 0.9 % IV SOLN
77.0000 mg/m2 | Freq: Once | INTRAVENOUS | Status: AC
  Administered 2022-02-04: 135 mg via INTRAVENOUS
  Filled 2022-02-04: qty 13.5

## 2022-02-04 MED ORDER — HEPARIN SOD (PORK) LOCK FLUSH 100 UNIT/ML IV SOLN
500.0000 [IU] | Freq: Once | INTRAVENOUS | Status: AC | PRN
  Administered 2022-02-04: 500 [IU]

## 2022-02-04 NOTE — Patient Instructions (Signed)
[facility]  Discharge Instructions: Thank you for choosing [facility] to provide your oncology and hematology care.   If you have a lab appointment with the Cancer Center, please go directly to the Cancer Center and check in at the registration area.   Wear comfortable clothing and clothing appropriate for easy access to any Portacath or PICC line.   We strive to give you quality time with your provider. You may need to reschedule your appointment if you arrive late (15 or more minutes).  Arriving late affects you and other patients whose appointments are after yours.  Also, if you miss three or more appointments without notifying the office, you may be dismissed from the clinic at the provider's discretion.      For prescription refill requests, have your pharmacy contact our office and allow 72 hours for refills to be completed.    Today you received the following chemotherapy and/or immunotherapy agents: Vidaza      To help prevent nausea and vomiting after your treatment, we encourage you to take your nausea medication as directed.  BELOW ARE SYMPTOMS THAT SHOULD BE REPORTED IMMEDIATELY: *FEVER GREATER THAN 100.4 F (38 C) OR HIGHER *CHILLS OR SWEATING *NAUSEA AND VOMITING THAT IS NOT CONTROLLED WITH YOUR NAUSEA MEDICATION *UNUSUAL SHORTNESS OF BREATH *UNUSUAL BRUISING OR BLEEDING *URINARY PROBLEMS (pain or burning when urinating, or frequent urination) *BOWEL PROBLEMS (unusual diarrhea, constipation, pain near the anus) TENDERNESS IN MOUTH AND THROAT WITH OR WITHOUT PRESENCE OF ULCERS (sore throat, sores in mouth, or a toothache) UNUSUAL RASH, SWELLING OR PAIN  UNUSUAL VAGINAL DISCHARGE OR ITCHING   Items with * indicate a potential emergency and should be followed up as soon as possible or go to the Emergency Department if any problems should occur.  Please show the CHEMOTHERAPY ALERT CARD or IMMUNOTHERAPY ALERT CARD at check-in to the  Emergency Department and triage nurse.  Should you have questions after your visit or need to cancel or reschedule your appointment, please contact [facility]  Dept: [phone]  and follow the prompts.  Office hours are 8:00 a.m. to 4:30 p.m. Monday - Friday. Please note that voicemails left after 4:00 p.m. may not be returned until the following business day.  We are closed weekends and major holidays. You have access to a nurse at all times for urgent questions. Please call the main number to the clinic Dept: [phone] and follow the prompts.   For any non-urgent questions, you may also contact your provider using MyChart. We now offer e-Visits for anyone 18 and older to request care online for non-urgent symptoms. For details visit [email or web address].   Also download the MyChart app! Go to the app store, search "MyChart", open the app, select [facility], and log in with your MyChart username and password.  Masks are optional in the cancer centers. If you would like for your care team to wear a mask while they are taking care of you, please let them know. You may have one support person who is at least 70 years old accompany you for your appointments. 

## 2022-02-04 NOTE — Progress Notes (Signed)
Pt tolerated treatment and discharged in stable condition.

## 2022-02-08 ENCOUNTER — Encounter: Payer: Self-pay | Admitting: Hematology

## 2022-02-08 DIAGNOSIS — D693 Immune thrombocytopenic purpura: Secondary | ICD-10-CM | POA: Diagnosis not present

## 2022-02-08 DIAGNOSIS — C9310 Chronic myelomonocytic leukemia not having achieved remission: Secondary | ICD-10-CM | POA: Diagnosis not present

## 2022-02-16 DIAGNOSIS — C9310 Chronic myelomonocytic leukemia not having achieved remission: Secondary | ICD-10-CM | POA: Diagnosis not present

## 2022-02-16 DIAGNOSIS — D693 Immune thrombocytopenic purpura: Secondary | ICD-10-CM | POA: Diagnosis not present

## 2022-02-22 DIAGNOSIS — D693 Immune thrombocytopenic purpura: Secondary | ICD-10-CM | POA: Diagnosis not present

## 2022-02-22 DIAGNOSIS — C9310 Chronic myelomonocytic leukemia not having achieved remission: Secondary | ICD-10-CM | POA: Diagnosis not present

## 2022-02-24 ENCOUNTER — Other Ambulatory Visit: Payer: Self-pay | Admitting: Hematology

## 2022-02-24 NOTE — Telephone Encounter (Signed)
Received [facility] e-scribe request for refill of Furosemide and Klor-Con as previously prescribed. Refill approved.

## 2022-02-25 ENCOUNTER — Other Ambulatory Visit: Payer: Self-pay | Admitting: Hematology

## 2022-02-25 MED FILL — Dexamethasone Sodium Phosphate Inj 100 MG/10ML: INTRAMUSCULAR | Qty: 1 | Status: AC

## 2022-02-28 ENCOUNTER — Other Ambulatory Visit: Payer: Self-pay

## 2022-02-28 ENCOUNTER — Inpatient Hospital Stay: Payer: Medicare HMO | Attending: Hematology

## 2022-02-28 ENCOUNTER — Other Ambulatory Visit: Payer: Self-pay | Admitting: Hematology

## 2022-02-28 ENCOUNTER — Encounter: Payer: Self-pay | Admitting: Hematology

## 2022-02-28 ENCOUNTER — Inpatient Hospital Stay (HOSPITAL_BASED_OUTPATIENT_CLINIC_OR_DEPARTMENT_OTHER): Payer: Medicare HMO | Admitting: Hematology

## 2022-02-28 ENCOUNTER — Inpatient Hospital Stay: Payer: Medicare HMO

## 2022-02-28 VITALS — BP 104/68 | HR 84 | Temp 98.5°F | Resp 16

## 2022-02-28 VITALS — BP 123/61 | HR 101 | Temp 98.5°F | Resp 18 | Ht 63.0 in | Wt 148.9 lb

## 2022-02-28 DIAGNOSIS — D693 Immune thrombocytopenic purpura: Secondary | ICD-10-CM | POA: Diagnosis present

## 2022-02-28 DIAGNOSIS — R000 Tachycardia, unspecified: Secondary | ICD-10-CM | POA: Diagnosis not present

## 2022-02-28 DIAGNOSIS — C9310 Chronic myelomonocytic leukemia not having achieved remission: Secondary | ICD-10-CM

## 2022-02-28 DIAGNOSIS — R232 Flushing: Secondary | ICD-10-CM | POA: Insufficient documentation

## 2022-02-28 DIAGNOSIS — D649 Anemia, unspecified: Secondary | ICD-10-CM | POA: Diagnosis not present

## 2022-02-28 DIAGNOSIS — D696 Thrombocytopenia, unspecified: Secondary | ICD-10-CM

## 2022-02-28 DIAGNOSIS — R42 Dizziness and giddiness: Secondary | ICD-10-CM | POA: Insufficient documentation

## 2022-02-28 DIAGNOSIS — Z79899 Other long term (current) drug therapy: Secondary | ICD-10-CM | POA: Insufficient documentation

## 2022-02-28 DIAGNOSIS — Z5111 Encounter for antineoplastic chemotherapy: Secondary | ICD-10-CM | POA: Diagnosis present

## 2022-02-28 DIAGNOSIS — E538 Deficiency of other specified B group vitamins: Secondary | ICD-10-CM | POA: Diagnosis not present

## 2022-02-28 DIAGNOSIS — Z9884 Bariatric surgery status: Secondary | ICD-10-CM | POA: Diagnosis not present

## 2022-02-28 DIAGNOSIS — R5383 Other fatigue: Secondary | ICD-10-CM | POA: Diagnosis not present

## 2022-02-28 DIAGNOSIS — Z95828 Presence of other vascular implants and grafts: Secondary | ICD-10-CM

## 2022-02-28 LAB — BASIC METABOLIC PANEL - CANCER CENTER ONLY
Anion gap: 6 (ref 5–15)
BUN: 13 mg/dL (ref 8–23)
CO2: 24 mmol/L (ref 22–32)
Calcium: 8.5 mg/dL — ABNORMAL LOW (ref 8.9–10.3)
Chloride: 110 mmol/L (ref 98–111)
Creatinine: 0.61 mg/dL (ref 0.44–1.00)
GFR, Estimated: >60 mL/min (ref >60)
Glucose, Bld: 201 mg/dL — ABNORMAL HIGH (ref 70–99)
Potassium: 4.1 mmol/L (ref 3.5–5.1)
Sodium: 140 mmol/L (ref 135–145)

## 2022-02-28 LAB — CBC WITH DIFFERENTIAL (CANCER CENTER ONLY)
Abs Immature Granulocytes: 0.02 10*3/uL (ref 0.00–0.07)
Basophils Absolute: 0 10*3/uL (ref 0.0–0.1)
Basophils Relative: 1 %
Eosinophils Absolute: 0 10*3/uL (ref 0.0–0.5)
Eosinophils Relative: 1 %
HCT: 26.6 % — ABNORMAL LOW (ref 36.0–46.0)
Hemoglobin: 8.1 g/dL — ABNORMAL LOW (ref 12.0–15.0)
Immature Granulocytes: 1 %
Lymphocytes Relative: 13 %
Lymphs Abs: 0.4 10*3/uL — ABNORMAL LOW (ref 0.7–4.0)
MCH: 24.9 pg — ABNORMAL LOW (ref 26.0–34.0)
MCHC: 30.5 g/dL (ref 30.0–36.0)
MCV: 81.8 fL (ref 80.0–100.0)
Monocytes Absolute: 0.9 10*3/uL (ref 0.1–1.0)
Monocytes Relative: 29 %
Neutro Abs: 1.7 10*3/uL (ref 1.7–7.7)
Neutrophils Relative %: 55 %
Platelet Count: 156 10*3/uL (ref 150–400)
RBC: 3.25 MIL/uL — ABNORMAL LOW (ref 3.87–5.11)
RDW: 21.7 % — ABNORMAL HIGH (ref 11.5–15.5)
WBC Count: 3.1 10*3/uL — ABNORMAL LOW (ref 4.0–10.5)
nRBC: 0 % (ref 0.0–0.2)

## 2022-02-28 LAB — PREPARE RBC (CROSSMATCH)

## 2022-02-28 LAB — SAMPLE TO BLOOD BANK

## 2022-02-28 MED ORDER — ROMIPLOSTIM INJECTION 500 MCG
5.0000 ug/kg | Freq: Once | SUBCUTANEOUS | Status: AC
  Administered 2022-02-28: 340 ug via SUBCUTANEOUS
  Filled 2022-02-28: qty 0.5

## 2022-02-28 MED ORDER — SODIUM CHLORIDE 0.9% FLUSH
10.0000 mL | Freq: Once | INTRAVENOUS | Status: AC
  Administered 2022-02-28: 10 mL

## 2022-02-28 MED ORDER — HEPARIN SOD (PORK) LOCK FLUSH 100 UNIT/ML IV SOLN
500.0000 [IU] | Freq: Once | INTRAVENOUS | Status: AC | PRN
  Administered 2022-02-28: 500 [IU]

## 2022-02-28 MED ORDER — SODIUM CHLORIDE 0.9 % IV SOLN: Freq: Once | INTRAVENOUS | Status: AC

## 2022-02-28 MED ORDER — SODIUM CHLORIDE 0.9 % IV SOLN
10.0000 mg | Freq: Once | INTRAVENOUS | Status: AC
  Administered 2022-02-28: 10 mg via INTRAVENOUS
  Filled 2022-02-28: qty 10

## 2022-02-28 MED ORDER — SODIUM CHLORIDE 0.9% FLUSH
10.0000 mL | INTRAVENOUS | Status: DC | PRN
  Administered 2022-02-28: 10 mL

## 2022-02-28 MED ORDER — SODIUM CHLORIDE 0.9 % IV SOLN
75.0000 mg/m2 | Freq: Once | INTRAVENOUS | Status: AC
  Administered 2022-02-28: 130 mg via INTRAVENOUS
  Filled 2022-02-28: qty 13

## 2022-02-28 MED ORDER — ONDANSETRON 4 MG PO TBDP: 4.0000 mg | ORAL_TABLET | Freq: Three times a day (TID) | ORAL | 0 refills | Status: DC | PRN

## 2022-02-28 MED FILL — Dexamethasone Sodium Phosphate Inj 100 MG/10ML: INTRAMUSCULAR | Qty: 1 | Status: AC

## 2022-02-28 NOTE — Progress Notes (Signed)
Placed orders for 1 unit of PRBCs to be administered on 03/01/2022. Spoke with Alexis in blood bank and she confirmed T&S and Prepare orders.

## 2022-02-28 NOTE — Patient Instructions (Signed)
[facility]  Discharge Instructions: Thank you for choosing [facility] to provide your oncology and hematology care.   If you have a lab appointment with the Cancer Center, please go directly to the Cancer Center and check in at the registration area.   Wear comfortable clothing and clothing appropriate for easy access to any Portacath or PICC line.   We strive to give you quality time with your provider. You may need to reschedule your appointment if you arrive late (15 or more minutes).  Arriving late affects you and other patients whose appointments are after yours.  Also, if you miss three or more appointments without notifying the office, you may be dismissed from the clinic at the provider's discretion.      For prescription refill requests, have your pharmacy contact our office and allow 72 hours for refills to be completed.    Today you received the following chemotherapy and/or immunotherapy agents: Vidaza      To help prevent nausea and vomiting after your treatment, we encourage you to take your nausea medication as directed.  BELOW ARE SYMPTOMS THAT SHOULD BE REPORTED IMMEDIATELY: *FEVER GREATER THAN 100.4 F (38 C) OR HIGHER *CHILLS OR SWEATING *NAUSEA AND VOMITING THAT IS NOT CONTROLLED WITH YOUR NAUSEA MEDICATION *UNUSUAL SHORTNESS OF BREATH *UNUSUAL BRUISING OR BLEEDING *URINARY PROBLEMS (pain or burning when urinating, or frequent urination) *BOWEL PROBLEMS (unusual diarrhea, constipation, pain near the anus) TENDERNESS IN MOUTH AND THROAT WITH OR WITHOUT PRESENCE OF ULCERS (sore throat, sores in mouth, or a toothache) UNUSUAL RASH, SWELLING OR PAIN  UNUSUAL VAGINAL DISCHARGE OR ITCHING   Items with * indicate a potential emergency and should be followed up as soon as possible or go to the Emergency Department if any problems should occur.  Please show the CHEMOTHERAPY ALERT CARD or IMMUNOTHERAPY ALERT CARD at check-in to the  Emergency Department and triage nurse.  Should you have questions after your visit or need to cancel or reschedule your appointment, please contact [facility]  Dept: [phone]  and follow the prompts.  Office hours are 8:00 a.m. to 4:30 p.m. Monday - Friday. Please note that voicemails left after 4:00 p.m. may not be returned until the following business day.  We are closed weekends and major holidays. You have access to a nurse at all times for urgent questions. Please call the main number to the clinic Dept: [phone] and follow the prompts.   For any non-urgent questions, you may also contact your provider using MyChart. We now offer e-Visits for anyone 18 and older to request care online for non-urgent symptoms. For details visit [email or web address].   Also download the MyChart app! Go to the app store, search "MyChart", open the app, select [facility], and log in with your MyChart username and password.  Masks are optional in the cancer centers. If you would like for your care team to wear a mask while they are taking care of you, please let them know. You may have one support person who is at least 70 years old accompany you for your appointments. 

## 2022-02-28 NOTE — Progress Notes (Signed)
[place]   Telephone:([phone]) [phone] Fax:(336) [phone]   Clinic Follow up Note   Patient Care Team: [name], DO as PCP - General (Family Medicine) Loletha Carrow, Kirke Corin, MD as Consulting Physician (Gastroenterology) Truitt Merle, MD as Consulting Physician (Hematology) Alda Berthold, DO as Consulting Physician (Neurology)  Date of Service:  02/28/2022  CHIEF COMPLAINT: f/u of [name], severe thrombocytopenia  CURRENT THERAPY:  Azacitidine days 1-5 q28 days, started 12/28/20 Nplate 39mcg/kg weekly, dose reduced since 07/19/21 when plt normalized  Platelet transfusion as needed (plt<15K), blood transfusion if Hg<8.0  ASSESSMENT & PLAN:  Kathleen Gibson is a 71 y.o. female with   1. [name] with severe thrombocytopenia  -Initially diagnosed with ITP in 2014. She lost follow up after 11/2017 and did not proceed with recommended bone marrow biopsy. -She was referred to ED 10/28/20 by her PCP for low plt count of 4k. She was hospitalized and treated with platelet transfusions, IVIG, oral prednisone $RemoveBeforeD'60mg'HtAyDPbyRBYzjv$  and Nplate injections. She tapered off prednisone given little response. -Bone marrow biopsy 10/30/20 felt to likely represent MDS/MPN, particularly [name].  Confirmed on slide review at [facility] by Dr. Linus Orn -She began treatment for severe refractory thrombocytopenia with weekly Rituxan x4 on 11/19/20, but she did not respond  -She began azacitidine daily days 1-5 q. 28 days on 12/28/20 -She had worsening thrombocytopenia after stopping Nplate, restarted weekly Nplate 10 mcg/kg on 0/45/99 -Bone marrow biopsy 03/18/21 showed stable disease, no increase in blasts. -labs reviewed, plt 156k today, Hg 8.1 today. She will proceed with Vidaza today and daily for the rest of this week, and continue weekly Nplate.   2. Normocytic anemia, moderate  -H/o pernicious anemia/vitamin B12 deficiency -Transfuse for Hgb </= 7.5 -Continue oral iron and oral B12. -previously on B12  injections due to prior gastric bypass surgery, last 12/06/21. -her hgb is 8.1 today (02/28/22). Given her fatigue, we will plan to give 1u pRBC this week with her treatment.   3. Thoracic spine compression fractures -T10 and T12 kyphoplasty performed 05/31/21 -she continues on oxycodone, managed by her PCP -will hold on Zometa for now -I previously advised her to restart calcium and vit D.     PLAN: -proceed with Nplate and azacitidine today. -continue azacitidine daily for the rest of this week as scheduled  -plan for 1u pRBC one day this week -I refilled sublingual zofran -continue Nplate weekly -next cycle Vidaza infusion daily X5, starting in 4 weeks -F/u in 4 weeks   No problem-specific Assessment & Plan notes found for this encounter.   SUMMARY OF ONCOLOGIC HISTORY: Oncology History  [name] (chronic myelomonocytic leukemia) ([place])  10/29/2020 Imaging   CT CAP  IMPRESSION: 1. Splenomegaly with pathologically enlarged lymph nodes above and below the diaphragm, with overall stable to minimally increased abdominal adenopathy and interval progression of the pelvic adenopathy. 2. Small volume abdominopelvic ascites with diffuse mesenteric stranding. 3. Scattered bilateral pulmonary micro nodules measuring 1-2 mm. 4. Distended gallbladder with some layering hyperdense material representing layering sludge and tiny stones seen on prior ultrasound. 5. Aortic atherosclerosis.   10/30/2020 Pathology Results   DIAGNOSIS:   BONE MARROW, ASPIRATE, CLOT, CORE:  -  Hypercellular bone marrow with panhyperplasia, atypia, and no  increase in blasts  -  See comment   PERIPHERAL BLOOD:  -  Marked thrombocytopenia  -  Absolute monocytosis  -  Normocytic anemia  -  See CBC data and comment   COMMENT:  The bone marrow  is hypercellular for age (approximately 80%) with myeloid hyperplasia with maturational left shift, erythroid hyperplasia, and increased megakaryocytes.  Mild multilineage  atypia is present. Blasts are not increased on aspirate smears (1% by manual differential count) or by CD34 immunohistochemistry on the core biopsy.  Concurrent flow cytometric analysis of the bone marrow aspirate demonstrates increased monocytes, and no increase in blasts or abnormal lymphoid population (see [id number]).  Monocytes are also increased in peripheral  blood, persistent per electronic medical record.  In aggregate, the  findings raise the possibility of a myeloid neoplasm with the  differential diagnosis including a low-grade myelodysplastic syndrome and chronic myelomonocytic leukemia (dysplastic type).    ADDENDUM:  A reticulin special stain performed on the bone marrow core biopsy reveals no significant increase in reticulin fibrosis.  ADDENDUM:  CYTOGENETIC RESULTS:  Karyotype: 46,XX[20]  Interpretation: NORMAL FEMALE KARYOTYPE   FISH RESULTS:  Results: NORMAL   ADDENDUM:  CD123 immunohistochemistry performed on the core biopsy highlights scattered aggregates of positively staining cells consistent with plasmacytoid dendritic cells.    10/30/2020 Pathology Results   DIAGNOSIS:   BONE MARROW; FLOW CYTOMETRIC ANALYSIS:  -  Increased monocytes  -  Scant B-cells present  -  No immunophenotypically aberrant T-cell population identified  -  No increase in blasts  -  See comment   COMMENT:  Monocytes are relatively increased (12% of all cells), without aberrant expression of CD56.  B-cells comprise <1% of total lymphocytes. CD34-positive blasts are not increased (<1% of all cells).  Correlation with concurrent morphology is recommended for complete diagnostic interpretation and overall blast enumeration (see [id number]).    10/30/2020 Pathology Results   FINAL MICROSCOPIC DIAGNOSIS:   A. LYMPH NODE, RIGHT AXILLARY, NEEDLE CORE BIOPSY:  -Lymphoid tissue present  -See comment   COMMENT:  The sections show several small needle core biopsy fragments of lymphoid tissue  displaying degenerative cellular changes/necrosis and hence cannot be accurately evaluated.  Sample for flow cytometric analysis not available.  Immunohistochemical stain for CD3 and CD20 were performed with appropriate controls.  There is a mixture of T and B cells in their apparently respective compartments.  There is no definite metastatic malignancy.    11/11/2020 Pathology Results   DIAGNOSIS:   LEFT AXILLARY LYMPH NODE EXCISIONAL BIOPSY; FLOW CYTOMETRIC ANALYSIS:  -  No monotypic B-cell or immunophenotypically aberrant T-cell  population identified  -  See comment   COMMENT:  Flow cytometric analysis identifies B-cells with a normal kappa:lambda ratio of 1.7:1.  A subset of the polytypic B-cells expresses CD10. T-cells show a CD4:CD8 ratio of 3.3:1 without immunophenotypic aberrancy with the markers evaluated.  Although these results do not support the diagnosis of a clonal lymphoid population, sampling issues must always be considered when negative results are obtained, as focal lesions may not be represented in the specimen submitted.  In addition, flow cytometric immunophenotyping will not exclude other pathology if present (e.g. Hodgkin lymphoma, some T-cell lymphomas, metastatic and infectious diseases).   11/11/2020 Pathology Results   FINAL MICROSCOPIC DIAGNOSIS:   A. LYMPH NODE, LEFT AXILLARY, DISSECTION:  -  Follicular hyperplasia with interfollicular expansion  -  See comment   COMMENT:  Sections of the lymph nodes reveal generally preserved lymph node architecture.  There is follicular hyperplasia with some follicles showing increased hyalinization of germinal centers and mild concentric encircling of germinal centers with small lymphocytes (onion skinning). Interfollicular areas are expanded with increased vascular proliferation, small lymphocytes, plasma cells, histiocytic cells, and patchy neutrophils.  The  lymph node capsule is variably thickened by fibrosis with few plasma  cells.   A panel of immunohistochemical stains is performed for further  characterization.  CD3 and CD20/PAX5 highlight T-cell and B-cell  compartments, respectively.  Germinal centers are highlighted by CD10 and BCL6 with appropriate absent expression of BCL2.  CD5 is similar to CD3.  CD43 also stains the T-cells.  CD4-positive T-cells exceed CD8-positive T-cells.  CD30 highlights scattered immunoblasts.  CD21 reveals intact follicular dendritic cell meshworks.  CD68 highlights increased histiocytic cells.  HHV 8 is negative.  T. pallidum reveals no definitive organisms.  Pancytokeratin is negative.  TdT stains rare cells.  CD138 highlights plasma cells which are not increased in number and show polytypic light chain expression by kappa/lambda in situ  hybridization.  EBV is negative by in situ hybridization.   Concurrent flow cytometric analysis is negative for a monoclonal B-cell or immunophenotypically aberrant T-cell population (see [phone]).   Together, the findings above are non-specific and can be seen in  reactive and infectious processes as well as [place]man disease.  There is no definitive morphologic or flow cytometric evidence of involvement by a lymphoproliferative disorder from the current workup.   12/17/2020 Initial Diagnosis   [name] (chronic myelomonocytic leukemia) ([place])   12/28/2020 - 02/03/2022 Chemotherapy   Patient is on Treatment Plan : MYELODYSPLASIA  Azacitidine IV D1-5 q28d     03/18/2021 Pathology Results   Final Diagnosis    BONE MARROW:             Persistent chronic myelomonocytic leukemia in a hypercellular bone marrow with increased monocytes and no increase in blasts.   Comment    Flow cytometric analysis showed no increase in blasts and 17% monocytes.A next generation myeloid panel showed the presence of the following mutations: NRAS, SH2B3, PHF6 and TET2. CD34 and CD117 immunstains do not show an increase in blasts. The findings are consistent with persistent  [name] with a decrease in the number of blasts compared to that seen on the previous bone marrow.    Final Interpretation      BONE MARROW; FLOW CYTOMETRIC ANALYSIS:   No increased blasts identified. (see comment)  Monocytosis (17% of total events).    COMMENT: Flow cytometry identified about 0.6% of total events as immature cells. These cells express CD45 (dim), CD34, CD117, HLA-DR, CD38, CD33 (variable). This immunophenotype is consistent with normal myeloblasts. In addition, monocytes are increased and comprise of approximately 17% of total events. These monocytes show normal expression of CD33/CD64/CD14/CD11b with variable expression of CD4. Correlation with concurrent morphology and clinical data is recommended (see [id number]).    FLOW CYTOMETRY ANALYSIS: CD45 versus side scatter analysis demonstrate a predominance of granulocytes (~70%), monocytes (~17%) and lymphocytes (~6%). No significant increase in blasts identified. All tested markers were used for adequate analysis of the cells and were appropriately reviewed.     12/28/2021 -  Chemotherapy   Patient is on Treatment Plan : MYELODYSPLASIA  Azacitidine IV D1-5 q28d        INTERVAL HISTORY:  MARGENE CHERIAN is here for a follow up of [name]. She was last seen by NP Lacie on 01/31/22. She presents to the clinic alone. She notes she is tired as she came back from [place] yesterday. She explains she splits the drive over two days.   All other systems were reviewed with the patient and are negative.  MEDICAL HISTORY:  Past Medical History:  Diagnosis Date   Diabetes ([place])    HLD (  hyperlipidemia)    Hypertension    Pernicious anemia    Pneumonia 09/05/2019   Renal insufficiency 09/06/2019   Vitamin D deficiency     SURGICAL HISTORY: Past Surgical History:  Procedure Laterality Date   ABDOMINAL HERNIA REPAIR  2009   ABDOMINOPLASTY     AXILLARY LYMPH NODE BIOPSY Left 11/11/2020   Procedure: LEFT AXILLARY EXCISIONAL LYMPH NODE BIOPSY;   Surgeon: [name], MD;  Location: [facility];  Service: General;  Laterality: Left;   [place]   hemorroid surgery  2007   IR IMAGING GUIDED PORT INSERTION  07/26/2021   IR KYPHO EA ADDL LEVEL THORACIC OR LUMBAR  05/31/2021   IR KYPHO THORACIC WITH BONE BIOPSY  05/31/2021   TONSILLECTOMY AND ADENOIDECTOMY  1957    I have reviewed the social history and family history with the patient and they are unchanged from previous note.  ALLERGIES:  is allergic to asa [aspirin] and nsaids.  MEDICATIONS:  Current Outpatient Medications  Medication Sig Dispense Refill   ondansetron (ZOFRAN-ODT) 4 MG disintegrating tablet Take 1 tablet (4 mg total) by mouth every 8 (eight) hours as needed for nausea or vomiting. 20 tablet 0   cyanocobalamin (,VITAMIN B-12,) 1000 MCG/ML injection Inject 1,000 mcg into the muscle every 30 (thirty) days.     cyclobenzaprine (FLEXERIL) 5 MG tablet Take 1 tablet (5 mg total) by mouth 3 (three) times daily as needed for muscle spasms. 30 tablet 0   furosemide (LASIX) 20 MG tablet TAKE 1 TABLET BY MOUTH EVERY DAY FOR UP TO 7 DAYS THEN AS NEEDED FOR LEG EDEMA 30 tablet 0   glucose blood (ONETOUCH VERIO) test strip Monitor blood sugars twice daily 100 each 5   KLOR-CON M10 10 MEQ tablet TAKE 1 TABLET BY MOUTH TWICE A DAY. TAKE ALONG WITH FUROSEMIDE ONLY 30 tablet 0   Lancets (ONETOUCH ULTRASOFT) lancets Monitor blood sugars twice daily 100 each 5   lidocaine-prilocaine (EMLA) cream APPLY 2 GRAMS TO PORT-A-CATH SITE 30-60 MINUTES PRIOR TO PORT ACCESS AS NEEDED 30 g 1   metoprolol succinate (TOPROL-XL) 25 MG 24 hr tablet Take 1 tablet (25 mg total) by mouth daily. 90 tablet 1   naloxone (NARCAN) nasal spray 4 mg/0.1 mL Place 1 spray into the nose once as needed (overdose). (Patient not taking: Reported on 12/01/2021) 2 each 0   Oxycodone HCl 10 MG TABS 1 tab every 4-6 hr prn for chronic pain 120 tablet 0   Oxycodone HCl 10 MG TABS 1 tab every 4-6  hours as needed for pain 120 tablet 0   polyethylene glycol powder (GLYCOLAX/MIRALAX) 17 GM/SCOOP powder Dissolve 1 capful (17 g) in water and drink 2 (two) times daily. (Patient taking differently: Take 17 g by mouth daily.) 510 g 2   pregabalin (LYRICA) 150 MG capsule Take 1 capsule (150 mg total) by mouth 2 (two) times daily. 180 capsule 1   senna-docusate (SENOKOT-S) 8.6-50 MG tablet Take 2 tablets by mouth at bedtime. (Patient taking differently: Take 2 tablets by mouth at bedtime as needed for mild constipation.) 60 tablet 2   sitaGLIPtin-metformin (JANUMET) 50-1000 MG tablet Take 1 tablet by mouth at bedtime. 90 tablet 1   No current facility-administered medications for this visit.   Facility-Administered Medications Ordered in Other Visits  Medication Dose Route Frequency Provider Last Rate Last Admin   0.9 %  sodium chloride infusion (Manually program via Guardrails IV Fluids)  250 mL Intravenous  Once Truitt Merle, MD       sodium chloride flush (NS) 0.9 % injection 10 mL  10 mL Intracatheter PRN Truitt Merle, MD   10 mL at 02/28/22 1040    PHYSICAL EXAMINATION: ECOG PERFORMANCE STATUS: 1 - Symptomatic but completely ambulatory  Vitals:   02/28/22 0839  BP: 123/61  Pulse: (!) 101  Resp: 18  Temp: 98.5 F (36.9 C)  SpO2: 99%   Wt Readings from Last 3 Encounters:  02/28/22 148 lb 14.4 oz (67.5 kg)  01/31/22 148 lb 4.8 oz (67.3 kg)  01/03/22 148 lb 1.6 oz (67.2 kg)     GENERAL:alert, no distress and comfortable SKIN: skin color normal, no rashes or significant lesions EYES: normal, Conjunctiva are pink and non-injected, sclera clear  NEURO: alert & oriented x 3 with fluent speech  LABORATORY DATA:  I have reviewed the data as listed    Latest Ref Rng & Units 02/28/2022    8:05 AM 01/31/2022   10:31 AM 01/24/2022    8:42 AM  CBC  WBC 4.0 - 10.5 K/uL 3.1  3.8  7.0   Hemoglobin 12.0 - 15.0 g/dL 8.1  8.2  8.4   Hematocrit 36.0 - 46.0 % 26.6  26.2  26.6   Platelets 150 - 400  K/uL 156  69  31         Latest Ref Rng & Units 02/28/2022    8:05 AM 01/31/2022   10:31 AM 01/17/2022    8:30 AM  CMP  Glucose 70 - 99 mg/dL 201  249  230   BUN 8 - 23 mg/dL $Remove'13  11  15   'jPLEiBE$ Creatinine 0.44 - 1.00 mg/dL 0.61  0.62  0.60   Sodium 135 - 145 mmol/L 140  137  139   Potassium 3.5 - 5.1 mmol/L 4.1  4.0  4.1   Chloride 98 - 111 mmol/L 110  107  108   CO2 22 - 32 mmol/L $RemoveB'24  27  26   'sJgLqkOV$ Calcium 8.9 - 10.3 mg/dL 8.5  8.5  8.2   Total Protein 6.5 - 8.1 g/dL  6.2  6.5   Total Bilirubin 0.3 - 1.2 mg/dL  0.9  0.9   Alkaline Phos 38 - 126 U/L  103  96   AST 15 - 41 U/L  17  20   ALT 0 - 44 U/L  17  18       RADIOGRAPHIC STUDIES: I have personally reviewed the radiological images as listed and agreed with the findings in the report. No results found.    No orders of the defined types were placed in this encounter.  All questions were answered. The patient knows to call the clinic with any problems, questions or concerns. No barriers to learning was detected. The total time spent in the appointment was 30 minutes.     Truitt Merle, MD 02/28/2022   I, Wilburn Mylar, am acting as scribe for Truitt Merle, MD.   I have reviewed the above documentation for accuracy and completeness, and I agree with the above.

## 2022-03-01 ENCOUNTER — Inpatient Hospital Stay (HOSPITAL_BASED_OUTPATIENT_CLINIC_OR_DEPARTMENT_OTHER): Payer: Medicare HMO | Admitting: Physician Assistant

## 2022-03-01 ENCOUNTER — Inpatient Hospital Stay: Payer: Medicare HMO

## 2022-03-01 ENCOUNTER — Other Ambulatory Visit: Payer: Self-pay

## 2022-03-01 VITALS — BP 112/62 | HR 91 | Temp 99.0°F | Resp 18

## 2022-03-01 DIAGNOSIS — R000 Tachycardia, unspecified: Secondary | ICD-10-CM | POA: Diagnosis not present

## 2022-03-01 DIAGNOSIS — R232 Flushing: Secondary | ICD-10-CM

## 2022-03-01 DIAGNOSIS — Z79899 Other long term (current) drug therapy: Secondary | ICD-10-CM | POA: Diagnosis not present

## 2022-03-01 DIAGNOSIS — Z9884 Bariatric surgery status: Secondary | ICD-10-CM | POA: Diagnosis not present

## 2022-03-01 DIAGNOSIS — D649 Anemia, unspecified: Secondary | ICD-10-CM | POA: Diagnosis not present

## 2022-03-01 DIAGNOSIS — R42 Dizziness and giddiness: Secondary | ICD-10-CM | POA: Diagnosis not present

## 2022-03-01 DIAGNOSIS — T8089XA Other complications following infusion, transfusion and therapeutic injection, initial encounter: Secondary | ICD-10-CM | POA: Diagnosis not present

## 2022-03-01 DIAGNOSIS — C9310 Chronic myelomonocytic leukemia not having achieved remission: Secondary | ICD-10-CM

## 2022-03-01 DIAGNOSIS — R5383 Other fatigue: Secondary | ICD-10-CM | POA: Diagnosis not present

## 2022-03-01 DIAGNOSIS — E538 Deficiency of other specified B group vitamins: Secondary | ICD-10-CM | POA: Diagnosis not present

## 2022-03-01 DIAGNOSIS — D693 Immune thrombocytopenic purpura: Secondary | ICD-10-CM | POA: Diagnosis not present

## 2022-03-01 DIAGNOSIS — Z5111 Encounter for antineoplastic chemotherapy: Secondary | ICD-10-CM | POA: Diagnosis not present

## 2022-03-01 LAB — URINALYSIS, COMPLETE (UACMP) WITH MICROSCOPIC
Bilirubin Urine: NEGATIVE
Glucose, UA: NEGATIVE mg/dL
Hgb urine dipstick: NEGATIVE
Ketones, ur: NEGATIVE mg/dL
Leukocytes,Ua: NEGATIVE
Nitrite: NEGATIVE
Protein, ur: NEGATIVE mg/dL
Specific Gravity, Urine: 1.021 (ref 1.005–1.030)
pH: 5 (ref 5.0–8.0)

## 2022-03-01 MED ORDER — SODIUM CHLORIDE 0.9 % IV SOLN: Freq: Once | INTRAVENOUS | Status: AC

## 2022-03-01 MED ORDER — SODIUM CHLORIDE 0.9 % IV SOLN
10.0000 mg | Freq: Once | INTRAVENOUS | Status: AC
  Administered 2022-03-01: 10 mg via INTRAVENOUS
  Filled 2022-03-01: qty 10

## 2022-03-01 MED ORDER — SODIUM CHLORIDE 0.9% FLUSH
10.0000 mL | INTRAVENOUS | Status: DC | PRN
  Administered 2022-03-01: 10 mL

## 2022-03-01 MED ORDER — METHYLPREDNISOLONE SODIUM SUCC 125 MG IJ SOLR
125.0000 mg | Freq: Once | INTRAMUSCULAR | Status: AC | PRN
  Administered 2022-03-01: 62.5 mg via INTRAVENOUS

## 2022-03-01 MED ORDER — FAMOTIDINE IN NACL 20-0.9 MG/50ML-% IV SOLN
20.0000 mg | Freq: Once | INTRAVENOUS | Status: AC | PRN
  Administered 2022-03-01: 20 mg via INTRAVENOUS

## 2022-03-01 MED ORDER — SODIUM CHLORIDE 0.9 % IV SOLN
75.0000 mg/m2 | Freq: Once | INTRAVENOUS | Status: AC
  Administered 2022-03-01: 130 mg via INTRAVENOUS
  Filled 2022-03-01: qty 13

## 2022-03-01 MED ORDER — SODIUM CHLORIDE 0.9% IV SOLUTION
250.0000 mL | Freq: Once | INTRAVENOUS | Status: AC
  Administered 2022-03-01: 250 mL via INTRAVENOUS

## 2022-03-01 MED ORDER — SODIUM CHLORIDE 0.9 % IV SOLN: Freq: Once | INTRAVENOUS | Status: DC | PRN

## 2022-03-01 MED ORDER — HEPARIN SOD (PORK) LOCK FLUSH 100 UNIT/ML IV SOLN
500.0000 [IU] | Freq: Once | INTRAVENOUS | Status: AC | PRN
  Administered 2022-03-01: 500 [IU]

## 2022-03-01 MED ORDER — DIPHENHYDRAMINE HCL 50 MG/ML IJ SOLN
50.0000 mg | Freq: Once | INTRAMUSCULAR | Status: AC | PRN
  Administered 2022-03-01: 50 mg via INTRAVENOUS

## 2022-03-01 MED ORDER — ONDANSETRON HCL 4 MG/2ML IJ SOLN
INTRAMUSCULAR | Status: AC
  Administered 2022-03-01: 4 mg
  Filled 2022-03-01: qty 2

## 2022-03-01 MED FILL — Dexamethasone Sodium Phosphate Inj 100 MG/10ML: INTRAMUSCULAR | Qty: 1 | Status: AC

## 2022-03-01 NOTE — Patient Instructions (Signed)
[facility]  Discharge Instructions: Thank you for choosing [facility] to provide your oncology and hematology care.   If you have a lab appointment with the [facility], please go directly to the [facility] and check in at the registration area.   Wear comfortable clothing and clothing appropriate for easy access to any Portacath or PICC line.   We strive to give you quality time with your provider. You may need to reschedule your appointment if you arrive late (15 or more minutes).  Arriving late affects you and other patients whose appointments are after yours.  Also, if you miss three or more appointments without notifying the office, you may be dismissed from the clinic at the provider's discretion.      For prescription refill requests, have your pharmacy contact our office and allow 72 hours for refills to be completed.    Today you received the following chemotherapy and/or immunotherapy agents: Vidaza      To help prevent nausea and vomiting after your treatment, we encourage you to take your nausea medication as directed.  BELOW ARE SYMPTOMS THAT SHOULD BE REPORTED IMMEDIATELY: *FEVER GREATER THAN 100.4 F (38 C) OR HIGHER *CHILLS OR SWEATING *NAUSEA AND VOMITING THAT IS NOT CONTROLLED WITH YOUR NAUSEA MEDICATION *UNUSUAL SHORTNESS OF BREATH *UNUSUAL BRUISING OR BLEEDING *URINARY PROBLEMS (pain or burning when urinating, or frequent urination) *BOWEL PROBLEMS (unusual diarrhea, constipation, pain near the anus) TENDERNESS IN MOUTH AND THROAT WITH OR WITHOUT PRESENCE OF ULCERS (sore throat, sores in mouth, or a toothache) UNUSUAL RASH, SWELLING OR PAIN  UNUSUAL VAGINAL DISCHARGE OR ITCHING   Items with * indicate a potential emergency and should be followed up as soon as possible or go to the Emergency Department if any problems should occur.  Please show the CHEMOTHERAPY ALERT CARD or IMMUNOTHERAPY ALERT CARD at check-in to the  Emergency Department and triage nurse.  Should you have questions after your visit or need to cancel or reschedule your appointment, please contact [facility]  Dept: [phone]  and follow the prompts.  Office hours are 8:00 a.m. to 4:30 p.m. Monday - Friday. Please note that voicemails left after 4:00 p.m. may not be returned until the following business day.  We are closed weekends and major holidays. You have access to a nurse at all times for urgent questions. Please call the main number to the clinic Dept: [phone] and follow the prompts.   For any non-urgent questions, you may also contact your provider using MyChart. We now offer e-Visits for anyone 37 and older to request care online for non-urgent symptoms. For details visit [email or web address].   Also download the MyChart app! Go to the app store, search "MyChart", open the app, select [facility], and log in with your MyChart username and password.  Masks are optional in the cancer centers. If you would like for your care team to wear a mask while they are taking care of you, please let them know. You may have one support person who is at least 70 years old accompany you for your appointments.  Blood Transfusion, Adult A blood transfusion is a procedure in which you receive blood or a type of blood cell (blood component) through an IV. You may need a blood transfusion when you have a low blood count, which is a low number of any blood cell. This may result from a bleeding disorder, illness, injury, or surgery. The blood may  come from a donor, or you may be able to have your own blood collected and stored (autologous blood donation) before a planned surgery. The blood given in a transfusion may be made up of different blood components. You may receive: Red blood cells. These carry oxygen to the cells in the body. Platelets. These help your blood to clot. Plasma. This is the liquid part of your blood.  It carries proteins and other substances throughout the body. White blood cells. These help you fight infections. If you have hemophilia or another clotting disorder, you may also receive other types of blood products. Depending on the type of blood product, this procedure may take 1-4 hours to complete. Tell a health care provider about: Any bleeding problems you have. Any previous reactions you have had during a blood transfusion. Any allergies you have. All medicines you are taking, including vitamins, herbs, eye drops, creams, and over-the-counter medicines. Any surgeries you have had. Any medical conditions you have. Whether you are pregnant or may be pregnant. What are the risks? Talk with your health care provider about risks. The most common problems include: A mild allergic reaction, such as red, swollen areas of skin (hives) and itching. Fever or chills. This may be the body's response to new blood cells received. This may occur during or up to 4 hours after the transfusion. More serious problems may include: A serious allergic reaction that causes difficulty breathing or swelling around the face and lips. Transfusion-associated circulatory overload (TACO), or too much fluid in the lungs. This may cause breathing problems. Transfusion-related acute lung injury (TRALI), which causes breathing difficulty and low oxygen in the blood. This can occur within hours of the transfusion or several days later. Iron overload. This can happen after receiving many blood transfusions over a period of time. Infection or virus being transmitted. This is rare because donated blood is carefully tested before it is given. Hemolytic transfusion reaction. This is rare. It happens when the body's defense system (immune system)tries to attack the new blood cells. Symptoms may include fever, chills, nausea, low blood pressure, and low back or chest pain. Transfusion-associated graft-versus-host disease  (TAGVHD). This is rare. It happens when donated cells attack the body's healthy tissues. What happens before the procedure? You will have a blood test to check your blood type. This test is done to know what kind of blood your body will accept and to match it to the donor blood. If you are going to have a planned surgery, you may be able to do an autologous blood donation. This may be done in case you need to have a transfusion. You will have your temperature, blood pressure, and pulse checked before the transfusion. If you have had an allergic reaction to a transfusion in the past, you may be given medicine to help prevent a reaction. This medicine may be given to you by mouth (orally) or through an IV. What happens during the procedure?  An IV will be inserted into one of your veins. The bag of blood will be attached to your IV. The blood will then enter through your vein. Your temperature, blood pressure, and pulse will be monitored during the transfusion. This monitoring is done to detect early signs of a transfusion reaction. Tell your nurse right away if you have any of these symptoms during the transfusion: Shortness of breath or trouble breathing. Chest or back pain. Fever or chills. Itching or hives. If you have any signs or symptoms of a  reaction, your transfusion will be stopped and you may be given medicine. When the transfusion is complete, your IV will be removed. Pressure may be applied to the IV site for a few minutes. A bandage (dressing)will be applied. The procedure may vary among health care providers and hospitals. What happens after the procedure? Your temperature, blood pressure, pulse, breathing rate, and blood oxygen level will be monitored until you leave the hospital or clinic. Your blood may be tested to see how you have responded to the transfusion. You may be warmed with fluids or blankets to maintain a normal body temperature. If you receive your blood  transfusion in an outpatient setting, you will be told whom to contact to report any reactions. Where to find more information Visit the [employer]: redcross.org Summary A blood transfusion is a procedure in which you receive blood or a type of blood cell (blood component) through an IV. The blood given in a transfusion may be made up of different blood components. You may receive red blood cells, platelets, plasma, or white blood cells depending on the condition treated. Your temperature, blood pressure, and pulse will be monitored before, during, and after the transfusion. After the transfusion, your blood may be tested to see how your body has responded. This information is not intended to replace advice given to you by your health care provider. Make sure you discuss any questions you have with your health care provider. Document Revised: 08/27/2021 Document Reviewed: 08/27/2021 Elsevier Patient Education  [place].

## 2022-03-01 NOTE — Progress Notes (Signed)
Hypersensitivity Reaction note  Date of event: 03/01/22 Time of event: 1047 Generic name of drug involved: no drug involved, RBC transfusion Name of provider notified of the hypersensitivity reaction: Anda Kraft, [place]; Dr Alvy Bimler; Dr Burr Medico Was agent that likely caused hypersensitivity reaction added to Allergies List within EMR? yes Chain of events including reaction signs/symptoms, treatment administered, and outcome (e.g., drug resumed; drug discontinued; sent to Emergency Department; etc.)  Pt has had blood transfusions in the past, tolerated well. No premeds ordered. With about 51m remaining in the blood transfusion, pt began to report feeling dizzy, nauseated, and facial flushing noted. Blood stopped-- VS obtained, pt tachy to 130. See flowsheets for vital documentation. NS hung to gravity. Benadryl, pepcid, solumedrol, and zofran given. See MAR for documentation. KAnda Kraft PA to chairside. EKG obtained. Per Dr GAlvy Bimler do not treat as blood transfusion reaction as she had almost completed transfusion, per Dr FBurr Medicodo treat as blood transfusion reaction. Blood transfusion reaction protocol initiated. Blood bank notified, urine and blood sample obtained, unit of RBCs with tubing sent back to blood bank. Pt observed until back to baseline, vidaza obtained from pharmacy and hung once pt was back to baseline. Pt tolerated vidaza well, discharged ambulatory to lobby with VSS, pt reports feeling back to baseline. Pt aware and agrees to call if she needs anything.    ECharleston Poot RN 03/01/2022 2:26 PM

## 2022-03-01 NOTE — Progress Notes (Signed)
DATE:  03/01/22                                        X  TRANSFUSION REACTION     MD: Burr Medico   AGENT/BLOOD PRODUCT RECEIVING TODAY:              blood   AGENT/BLOOD PRODUCT RECEIVING IMMEDIATELY PRIOR TO REACTION:          blood   VS: BP:     143/52   P:       130       SPO2:       98% RA                BP:     144/67   P:       129       SPO2:       98% RA     REACTION(S):           facial flushing, dizziness, tachycardic, nausea   PREMEDS:     decadron (premed received for Vidaza before blood transfusion)   INTERVENTION: Pepcid 20 mg, 50 mg benadryl, zofran 4 mg, solumedrol 62.5 mg   Review of Systems  Review of Systems  Constitutional:  Negative for chills and fever.  HENT:  Negative for facial swelling and trouble swallowing.   Respiratory:  Negative for cough, chest tightness and shortness of breath.   Cardiovascular:  Negative for chest pain, palpitations and leg swelling.  Gastrointestinal:  Positive for nausea. Negative for abdominal pain and vomiting.  Musculoskeletal:  Negative for arthralgias, back pain and myalgias.  Skin:  Positive for color change (facial flushing).  Neurological:  Positive for dizziness. Negative for tremors, seizures, speech difficulty and weakness.     Physical Exam  Physical Exam Vitals and nursing note reviewed.  Constitutional:      Appearance: She is well-developed. She is not ill-appearing or toxic-appearing.  HENT:     Head: Normocephalic.     Nose: Nose normal.  Eyes:     Conjunctiva/sclera: Conjunctivae normal.  Neck:     Vascular: No JVD.  Cardiovascular:     Rate and Rhythm: Regular rhythm. Tachycardia present.     Pulses: Normal pulses.     Heart sounds: Normal heart sounds.  Pulmonary:     Effort: Pulmonary effort is normal. No respiratory distress.     Breath sounds: Normal breath sounds. No stridor. No wheezing or rhonchi.  Chest:     Chest wall: No tenderness.  Abdominal:     General: There is no distension.   Musculoskeletal:        General: Normal range of motion.     Cervical back: Normal range of motion.     Right lower leg: No edema.     Left lower leg: No edema.  Skin:    General: Skin is warm and dry.     Comments: Facial flushing  Neurological:     Mental Status: She is oriented to person, place, and time.     OUTCOME:                During transfusion patient felt facial flushing coming on slowly. When there was approximately 30cc of blood remaining in transfusion remaining patient became dizzy and was tachycardic to 130s with temperature t-max 99.5. EKG shows sinus tachycardia.  Emergency medications given  as above including IVF. Patient observed for 1 hour and symptoms improved, HR 102. Dr. Burr Medico made aware and is requesting transfusion reaction work up. Patient returned to baseline and tolerated Vidaza. Dr. Burr Medico will see her tomorrow during infusion for recheck.

## 2022-03-02 ENCOUNTER — Other Ambulatory Visit: Payer: Self-pay

## 2022-03-02 ENCOUNTER — Inpatient Hospital Stay: Payer: Medicare HMO

## 2022-03-02 VITALS — BP 135/86 | HR 99 | Temp 97.7°F | Resp 18

## 2022-03-02 DIAGNOSIS — C9310 Chronic myelomonocytic leukemia not having achieved remission: Secondary | ICD-10-CM

## 2022-03-02 DIAGNOSIS — R000 Tachycardia, unspecified: Secondary | ICD-10-CM | POA: Diagnosis not present

## 2022-03-02 DIAGNOSIS — R42 Dizziness and giddiness: Secondary | ICD-10-CM | POA: Diagnosis not present

## 2022-03-02 DIAGNOSIS — Z9884 Bariatric surgery status: Secondary | ICD-10-CM | POA: Diagnosis not present

## 2022-03-02 DIAGNOSIS — R232 Flushing: Secondary | ICD-10-CM | POA: Diagnosis not present

## 2022-03-02 DIAGNOSIS — D649 Anemia, unspecified: Secondary | ICD-10-CM | POA: Diagnosis not present

## 2022-03-02 DIAGNOSIS — Z5111 Encounter for antineoplastic chemotherapy: Secondary | ICD-10-CM | POA: Diagnosis not present

## 2022-03-02 DIAGNOSIS — D693 Immune thrombocytopenic purpura: Secondary | ICD-10-CM | POA: Diagnosis not present

## 2022-03-02 DIAGNOSIS — Z79899 Other long term (current) drug therapy: Secondary | ICD-10-CM | POA: Diagnosis not present

## 2022-03-02 DIAGNOSIS — E538 Deficiency of other specified B group vitamins: Secondary | ICD-10-CM | POA: Diagnosis not present

## 2022-03-02 DIAGNOSIS — R5383 Other fatigue: Secondary | ICD-10-CM | POA: Diagnosis not present

## 2022-03-02 LAB — BASIC METABOLIC PANEL - CANCER CENTER ONLY
Anion gap: 5 (ref 5–15)
BUN: 14 mg/dL (ref 8–23)
CO2: 25 mmol/L (ref 22–32)
Calcium: 8.4 mg/dL — ABNORMAL LOW (ref 8.9–10.3)
Chloride: 108 mmol/L (ref 98–111)
Creatinine: 0.69 mg/dL (ref 0.44–1.00)
GFR, Estimated: >60 mL/min (ref >60)
Glucose, Bld: 247 mg/dL — ABNORMAL HIGH (ref 70–99)
Potassium: 3.9 mmol/L (ref 3.5–5.1)
Sodium: 138 mmol/L (ref 135–145)

## 2022-03-02 LAB — CBC WITH DIFFERENTIAL (CANCER CENTER ONLY)
Abs Immature Granulocytes: 0.04 10*3/uL (ref 0.00–0.07)
Basophils Absolute: 0 10*3/uL (ref 0.0–0.1)
Basophils Relative: 1 %
Eosinophils Absolute: 0 10*3/uL (ref 0.0–0.5)
Eosinophils Relative: 0 %
HCT: 27.1 % — ABNORMAL LOW (ref 36.0–46.0)
Hemoglobin: 8.6 g/dL — ABNORMAL LOW (ref 12.0–15.0)
Immature Granulocytes: 1 %
Lymphocytes Relative: 12 %
Lymphs Abs: 0.5 10*3/uL — ABNORMAL LOW (ref 0.7–4.0)
MCH: 25.7 pg — ABNORMAL LOW (ref 26.0–34.0)
MCHC: 31.7 g/dL (ref 30.0–36.0)
MCV: 80.9 fL (ref 80.0–100.0)
Monocytes Absolute: 1.1 10*3/uL — ABNORMAL HIGH (ref 0.1–1.0)
Monocytes Relative: 24 %
Neutro Abs: 2.8 10*3/uL (ref 1.7–7.7)
Neutrophils Relative %: 62 %
Platelet Count: 156 10*3/uL (ref 150–400)
RBC: 3.35 MIL/uL — ABNORMAL LOW (ref 3.87–5.11)
RDW: 21 % — ABNORMAL HIGH (ref 11.5–15.5)
WBC Count: 4.4 10*3/uL (ref 4.0–10.5)
nRBC: 0 % (ref 0.0–0.2)

## 2022-03-02 LAB — TYPE AND SCREEN
ABO/RH(D): O POS
Antibody Screen: NEGATIVE
Unit division: 0

## 2022-03-02 LAB — BPAM RBC
Blood Product Expiration Date: 202310242359
ISSUE DATE / TIME: 202309190840
Unit Type and Rh: 5100

## 2022-03-02 LAB — TRANSFUSION REACTION
DAT C3: NEGATIVE
Post RXN DAT IgG: NEGATIVE

## 2022-03-02 MED ORDER — SODIUM CHLORIDE 0.9 % IV SOLN
75.0000 mg/m2 | Freq: Once | INTRAVENOUS | Status: AC
  Administered 2022-03-02: 130 mg via INTRAVENOUS
  Filled 2022-03-02: qty 13

## 2022-03-02 MED ORDER — SODIUM CHLORIDE 0.9 % IV SOLN
10.0000 mg | Freq: Once | INTRAVENOUS | Status: AC
  Administered 2022-03-02: 10 mg via INTRAVENOUS
  Filled 2022-03-02: qty 10

## 2022-03-02 MED ORDER — SODIUM CHLORIDE 0.9 % IV SOLN: Freq: Once | INTRAVENOUS | Status: AC

## 2022-03-02 MED FILL — Dexamethasone Sodium Phosphate Inj 100 MG/10ML: INTRAMUSCULAR | Qty: 1 | Status: AC

## 2022-03-03 ENCOUNTER — Inpatient Hospital Stay: Payer: Medicare HMO

## 2022-03-03 VITALS — BP 137/67 | HR 98 | Temp 97.8°F | Resp 18

## 2022-03-03 DIAGNOSIS — R000 Tachycardia, unspecified: Secondary | ICD-10-CM | POA: Diagnosis not present

## 2022-03-03 DIAGNOSIS — C9310 Chronic myelomonocytic leukemia not having achieved remission: Secondary | ICD-10-CM | POA: Diagnosis not present

## 2022-03-03 DIAGNOSIS — E538 Deficiency of other specified B group vitamins: Secondary | ICD-10-CM | POA: Diagnosis not present

## 2022-03-03 DIAGNOSIS — R232 Flushing: Secondary | ICD-10-CM | POA: Diagnosis not present

## 2022-03-03 DIAGNOSIS — D693 Immune thrombocytopenic purpura: Secondary | ICD-10-CM | POA: Diagnosis not present

## 2022-03-03 DIAGNOSIS — R42 Dizziness and giddiness: Secondary | ICD-10-CM | POA: Diagnosis not present

## 2022-03-03 DIAGNOSIS — Z5111 Encounter for antineoplastic chemotherapy: Secondary | ICD-10-CM | POA: Diagnosis not present

## 2022-03-03 DIAGNOSIS — D649 Anemia, unspecified: Secondary | ICD-10-CM | POA: Diagnosis not present

## 2022-03-03 DIAGNOSIS — Z79899 Other long term (current) drug therapy: Secondary | ICD-10-CM | POA: Diagnosis not present

## 2022-03-03 DIAGNOSIS — R5383 Other fatigue: Secondary | ICD-10-CM | POA: Diagnosis not present

## 2022-03-03 DIAGNOSIS — Z9884 Bariatric surgery status: Secondary | ICD-10-CM | POA: Diagnosis not present

## 2022-03-03 MED ORDER — SODIUM CHLORIDE 0.9 % IV SOLN
75.0000 mg/m2 | Freq: Once | INTRAVENOUS | Status: AC
  Administered 2022-03-03: 130 mg via INTRAVENOUS
  Filled 2022-03-03: qty 13

## 2022-03-03 MED ORDER — SODIUM CHLORIDE 0.9 % IV SOLN
10.0000 mg | Freq: Once | INTRAVENOUS | Status: AC
  Administered 2022-03-03: 10 mg via INTRAVENOUS
  Filled 2022-03-03: qty 10

## 2022-03-03 MED ORDER — HEPARIN SOD (PORK) LOCK FLUSH 100 UNIT/ML IV SOLN
500.0000 [IU] | Freq: Once | INTRAVENOUS | Status: AC | PRN
  Administered 2022-03-03: 500 [IU]

## 2022-03-03 MED ORDER — SODIUM CHLORIDE 0.9% FLUSH
10.0000 mL | INTRAVENOUS | Status: DC | PRN
  Administered 2022-03-03: 10 mL

## 2022-03-03 MED ORDER — SODIUM CHLORIDE 0.9 % IV SOLN: Freq: Once | INTRAVENOUS | Status: AC

## 2022-03-03 MED FILL — Dexamethasone Sodium Phosphate Inj 100 MG/10ML: INTRAMUSCULAR | Qty: 1 | Status: AC

## 2022-03-03 NOTE — Patient Instructions (Signed)
[facility]  Discharge Instructions: Thank you for choosing [facility] to provide your oncology and hematology care.   If you have a lab appointment with the Cancer Center, please go directly to the Cancer Center and check in at the registration area.   Wear comfortable clothing and clothing appropriate for easy access to any Portacath or PICC line.   We strive to give you quality time with your provider. You may need to reschedule your appointment if you arrive late (15 or more minutes).  Arriving late affects you and other patients whose appointments are after yours.  Also, if you miss three or more appointments without notifying the office, you may be dismissed from the clinic at the provider's discretion.      For prescription refill requests, have your pharmacy contact our office and allow 72 hours for refills to be completed.    Today you received the following chemotherapy and/or immunotherapy agents: Vidaza      To help prevent nausea and vomiting after your treatment, we encourage you to take your nausea medication as directed.  BELOW ARE SYMPTOMS THAT SHOULD BE REPORTED IMMEDIATELY: *FEVER GREATER THAN 100.4 F (38 C) OR HIGHER *CHILLS OR SWEATING *NAUSEA AND VOMITING THAT IS NOT CONTROLLED WITH YOUR NAUSEA MEDICATION *UNUSUAL SHORTNESS OF BREATH *UNUSUAL BRUISING OR BLEEDING *URINARY PROBLEMS (pain or burning when urinating, or frequent urination) *BOWEL PROBLEMS (unusual diarrhea, constipation, pain near the anus) TENDERNESS IN MOUTH AND THROAT WITH OR WITHOUT PRESENCE OF ULCERS (sore throat, sores in mouth, or a toothache) UNUSUAL RASH, SWELLING OR PAIN  UNUSUAL VAGINAL DISCHARGE OR ITCHING   Items with * indicate a potential emergency and should be followed up as soon as possible or go to the Emergency Department if any problems should occur.  Please show the CHEMOTHERAPY ALERT CARD or IMMUNOTHERAPY ALERT CARD at check-in to the  Emergency Department and triage nurse.  Should you have questions after your visit or need to cancel or reschedule your appointment, please contact [facility]  Dept: [phone]  and follow the prompts.  Office hours are 8:00 a.m. to 4:30 p.m. Monday - Friday. Please note that voicemails left after 4:00 p.m. may not be returned until the following business day.  We are closed weekends and major holidays. You have access to a nurse at all times for urgent questions. Please call the main number to the clinic Dept: [phone] and follow the prompts.   For any non-urgent questions, you may also contact your provider using MyChart. We now offer e-Visits for anyone 18 and older to request care online for non-urgent symptoms. For details visit [email or web address].   Also download the MyChart app! Go to the app store, search "MyChart", open the app, select [facility], and log in with your MyChart username and password.  Masks are optional in the cancer centers. If you would like for your care team to wear a mask while they are taking care of you, please let them know. You may have one support person who is at least 70 years old accompany you for your appointments. 

## 2022-03-04 ENCOUNTER — Inpatient Hospital Stay: Payer: Medicare HMO

## 2022-03-04 VITALS — BP 132/75 | HR 95 | Temp 98.3°F | Resp 17

## 2022-03-04 DIAGNOSIS — R5383 Other fatigue: Secondary | ICD-10-CM | POA: Diagnosis not present

## 2022-03-04 DIAGNOSIS — Z79899 Other long term (current) drug therapy: Secondary | ICD-10-CM | POA: Diagnosis not present

## 2022-03-04 DIAGNOSIS — E538 Deficiency of other specified B group vitamins: Secondary | ICD-10-CM | POA: Diagnosis not present

## 2022-03-04 DIAGNOSIS — C9310 Chronic myelomonocytic leukemia not having achieved remission: Secondary | ICD-10-CM | POA: Diagnosis not present

## 2022-03-04 DIAGNOSIS — R232 Flushing: Secondary | ICD-10-CM | POA: Diagnosis not present

## 2022-03-04 DIAGNOSIS — Z9884 Bariatric surgery status: Secondary | ICD-10-CM | POA: Diagnosis not present

## 2022-03-04 DIAGNOSIS — R000 Tachycardia, unspecified: Secondary | ICD-10-CM | POA: Diagnosis not present

## 2022-03-04 DIAGNOSIS — R42 Dizziness and giddiness: Secondary | ICD-10-CM | POA: Diagnosis not present

## 2022-03-04 DIAGNOSIS — D649 Anemia, unspecified: Secondary | ICD-10-CM | POA: Diagnosis not present

## 2022-03-04 DIAGNOSIS — Z5111 Encounter for antineoplastic chemotherapy: Secondary | ICD-10-CM | POA: Diagnosis not present

## 2022-03-04 DIAGNOSIS — D693 Immune thrombocytopenic purpura: Secondary | ICD-10-CM | POA: Diagnosis not present

## 2022-03-04 MED ORDER — HEPARIN SOD (PORK) LOCK FLUSH 100 UNIT/ML IV SOLN
500.0000 [IU] | Freq: Once | INTRAVENOUS | Status: AC | PRN
  Administered 2022-03-04: 500 [IU]

## 2022-03-04 MED ORDER — SODIUM CHLORIDE 0.9 % IV SOLN: Freq: Once | INTRAVENOUS | Status: AC

## 2022-03-04 MED ORDER — SODIUM CHLORIDE 0.9 % IV SOLN
75.0000 mg/m2 | Freq: Once | INTRAVENOUS | Status: AC
  Administered 2022-03-04: 130 mg via INTRAVENOUS
  Filled 2022-03-04: qty 13

## 2022-03-04 MED ORDER — SODIUM CHLORIDE 0.9% FLUSH
10.0000 mL | INTRAVENOUS | Status: DC | PRN
  Administered 2022-03-04: 10 mL

## 2022-03-04 MED ORDER — SODIUM CHLORIDE 0.9 % IV SOLN
10.0000 mg | Freq: Once | INTRAVENOUS | Status: AC
  Administered 2022-03-04: 10 mg via INTRAVENOUS
  Filled 2022-03-04: qty 10

## 2022-03-07 ENCOUNTER — Inpatient Hospital Stay: Payer: Medicare HMO

## 2022-03-07 ENCOUNTER — Other Ambulatory Visit: Payer: Self-pay | Admitting: Hematology

## 2022-03-07 VITALS — BP 138/76 | HR 92 | Temp 98.6°F | Resp 18

## 2022-03-07 DIAGNOSIS — R42 Dizziness and giddiness: Secondary | ICD-10-CM | POA: Diagnosis not present

## 2022-03-07 DIAGNOSIS — D696 Thrombocytopenia, unspecified: Secondary | ICD-10-CM

## 2022-03-07 DIAGNOSIS — Z5111 Encounter for antineoplastic chemotherapy: Secondary | ICD-10-CM | POA: Diagnosis not present

## 2022-03-07 DIAGNOSIS — C9310 Chronic myelomonocytic leukemia not having achieved remission: Secondary | ICD-10-CM | POA: Diagnosis not present

## 2022-03-07 DIAGNOSIS — R000 Tachycardia, unspecified: Secondary | ICD-10-CM | POA: Diagnosis not present

## 2022-03-07 DIAGNOSIS — R5383 Other fatigue: Secondary | ICD-10-CM | POA: Diagnosis not present

## 2022-03-07 DIAGNOSIS — Z95828 Presence of other vascular implants and grafts: Secondary | ICD-10-CM

## 2022-03-07 DIAGNOSIS — Z79899 Other long term (current) drug therapy: Secondary | ICD-10-CM | POA: Diagnosis not present

## 2022-03-07 DIAGNOSIS — E538 Deficiency of other specified B group vitamins: Secondary | ICD-10-CM | POA: Diagnosis not present

## 2022-03-07 DIAGNOSIS — D649 Anemia, unspecified: Secondary | ICD-10-CM | POA: Diagnosis not present

## 2022-03-07 DIAGNOSIS — D693 Immune thrombocytopenic purpura: Secondary | ICD-10-CM | POA: Diagnosis not present

## 2022-03-07 DIAGNOSIS — Z9884 Bariatric surgery status: Secondary | ICD-10-CM | POA: Diagnosis not present

## 2022-03-07 DIAGNOSIS — R232 Flushing: Secondary | ICD-10-CM | POA: Diagnosis not present

## 2022-03-07 LAB — CBC WITH DIFFERENTIAL (CANCER CENTER ONLY)
Abs Immature Granulocytes: 0.44 10*3/uL — ABNORMAL HIGH (ref 0.00–0.07)
Basophils Absolute: 0.1 10*3/uL (ref 0.0–0.1)
Basophils Relative: 1 %
Eosinophils Absolute: 0 10*3/uL (ref 0.0–0.5)
Eosinophils Relative: 1 %
HCT: 32.3 % — ABNORMAL LOW (ref 36.0–46.0)
Hemoglobin: 10.2 g/dL — ABNORMAL LOW (ref 12.0–15.0)
Immature Granulocytes: 7 %
Lymphocytes Relative: 10 %
Lymphs Abs: 0.7 10*3/uL (ref 0.7–4.0)
MCH: 25.2 pg — ABNORMAL LOW (ref 26.0–34.0)
MCHC: 31.6 g/dL (ref 30.0–36.0)
MCV: 80 fL (ref 80.0–100.0)
Monocytes Absolute: 0.9 10*3/uL (ref 0.1–1.0)
Monocytes Relative: 14 %
Neutro Abs: 4.4 10*3/uL (ref 1.7–7.7)
Neutrophils Relative %: 67 %
Platelet Count: 90 10*3/uL — ABNORMAL LOW (ref 150–400)
RBC: 4.04 MIL/uL (ref 3.87–5.11)
RDW: 21.7 % — ABNORMAL HIGH (ref 11.5–15.5)
WBC Count: 6.5 10*3/uL (ref 4.0–10.5)
nRBC: 0 % (ref 0.0–0.2)

## 2022-03-07 LAB — SAMPLE TO BLOOD BANK

## 2022-03-07 LAB — COMPREHENSIVE METABOLIC PANEL
ALT: 17 U/L (ref 0–44)
AST: 14 U/L — ABNORMAL LOW (ref 15–41)
Albumin: 3.2 g/dL — ABNORMAL LOW (ref 3.5–5.0)
Alkaline Phosphatase: 93 U/L (ref 38–126)
Anion gap: 4 — ABNORMAL LOW (ref 5–15)
BUN: 13 mg/dL (ref 8–23)
CO2: 27 mmol/L (ref 22–32)
Calcium: 8.3 mg/dL — ABNORMAL LOW (ref 8.9–10.3)
Chloride: 106 mmol/L (ref 98–111)
Creatinine, Ser: 0.64 mg/dL (ref 0.44–1.00)
GFR, Estimated: >60 mL/min (ref >60)
Glucose, Bld: 194 mg/dL — ABNORMAL HIGH (ref 70–99)
Potassium: 4.1 mmol/L (ref 3.5–5.1)
Sodium: 137 mmol/L (ref 135–145)
Total Bilirubin: 1 mg/dL (ref 0.3–1.2)
Total Protein: 6.5 g/dL (ref 6.5–8.1)

## 2022-03-07 MED ORDER — HEPARIN SOD (PORK) LOCK FLUSH 100 UNIT/ML IV SOLN
500.0000 [IU] | Freq: Once | INTRAVENOUS | Status: AC
  Administered 2022-03-07: 500 [IU]

## 2022-03-07 MED ORDER — SODIUM CHLORIDE 0.9% FLUSH
10.0000 mL | Freq: Once | INTRAVENOUS | Status: AC
  Administered 2022-03-07: 10 mL

## 2022-03-07 MED ORDER — ROMIPLOSTIM INJECTION 500 MCG
5.0000 ug/kg | Freq: Once | SUBCUTANEOUS | Status: AC
  Administered 2022-03-07: 340 ug via SUBCUTANEOUS
  Filled 2022-03-07: qty 0.68

## 2022-03-12 ENCOUNTER — Other Ambulatory Visit: Payer: Self-pay

## 2022-03-14 ENCOUNTER — Other Ambulatory Visit: Payer: Self-pay

## 2022-03-14 ENCOUNTER — Inpatient Hospital Stay: Payer: Medicare HMO

## 2022-03-14 ENCOUNTER — Inpatient Hospital Stay: Payer: Medicare HMO | Attending: Hematology

## 2022-03-14 DIAGNOSIS — K5900 Constipation, unspecified: Secondary | ICD-10-CM | POA: Insufficient documentation

## 2022-03-14 DIAGNOSIS — D693 Immune thrombocytopenic purpura: Secondary | ICD-10-CM | POA: Insufficient documentation

## 2022-03-14 DIAGNOSIS — Z7952 Long term (current) use of systemic steroids: Secondary | ICD-10-CM | POA: Diagnosis not present

## 2022-03-14 DIAGNOSIS — D696 Thrombocytopenia, unspecified: Secondary | ICD-10-CM

## 2022-03-14 DIAGNOSIS — C9310 Chronic myelomonocytic leukemia not having achieved remission: Secondary | ICD-10-CM | POA: Diagnosis present

## 2022-03-14 DIAGNOSIS — Z5111 Encounter for antineoplastic chemotherapy: Secondary | ICD-10-CM | POA: Diagnosis present

## 2022-03-14 DIAGNOSIS — R69 Illness, unspecified: Secondary | ICD-10-CM | POA: Diagnosis not present

## 2022-03-14 DIAGNOSIS — Z79899 Other long term (current) drug therapy: Secondary | ICD-10-CM | POA: Diagnosis not present

## 2022-03-14 DIAGNOSIS — Z95828 Presence of other vascular implants and grafts: Secondary | ICD-10-CM

## 2022-03-14 DIAGNOSIS — Z9884 Bariatric surgery status: Secondary | ICD-10-CM | POA: Diagnosis not present

## 2022-03-14 DIAGNOSIS — Z7984 Long term (current) use of oral hypoglycemic drugs: Secondary | ICD-10-CM | POA: Insufficient documentation

## 2022-03-14 DIAGNOSIS — F419 Anxiety disorder, unspecified: Secondary | ICD-10-CM | POA: Insufficient documentation

## 2022-03-14 DIAGNOSIS — I10 Essential (primary) hypertension: Secondary | ICD-10-CM | POA: Diagnosis not present

## 2022-03-14 DIAGNOSIS — E785 Hyperlipidemia, unspecified: Secondary | ICD-10-CM | POA: Insufficient documentation

## 2022-03-14 DIAGNOSIS — E119 Type 2 diabetes mellitus without complications: Secondary | ICD-10-CM | POA: Insufficient documentation

## 2022-03-14 LAB — COMPREHENSIVE METABOLIC PANEL
ALT: 22 U/L (ref 0–44)
AST: 21 U/L (ref 15–41)
Albumin: 3.3 g/dL — ABNORMAL LOW (ref 3.5–5.0)
Alkaline Phosphatase: 107 U/L (ref 38–126)
Anion gap: 5 (ref 5–15)
BUN: 13 mg/dL (ref 8–23)
CO2: 26 mmol/L (ref 22–32)
Calcium: 8.3 mg/dL — ABNORMAL LOW (ref 8.9–10.3)
Chloride: 109 mmol/L (ref 98–111)
Creatinine, Ser: 0.66 mg/dL (ref 0.44–1.00)
GFR, Estimated: >60 mL/min (ref >60)
Glucose, Bld: 124 mg/dL — ABNORMAL HIGH (ref 70–99)
Potassium: 4 mmol/L (ref 3.5–5.1)
Sodium: 140 mmol/L (ref 135–145)
Total Bilirubin: 0.9 mg/dL (ref 0.3–1.2)
Total Protein: 6 g/dL — ABNORMAL LOW (ref 6.5–8.1)

## 2022-03-14 LAB — CBC WITH DIFFERENTIAL (CANCER CENTER ONLY)
Abs Immature Granulocytes: 0.63 10*3/uL — ABNORMAL HIGH (ref 0.00–0.07)
Basophils Absolute: 0.1 10*3/uL (ref 0.0–0.1)
Basophils Relative: 1 %
Eosinophils Absolute: 0 10*3/uL (ref 0.0–0.5)
Eosinophils Relative: 0 %
HCT: 29.4 % — ABNORMAL LOW (ref 36.0–46.0)
Hemoglobin: 9.3 g/dL — ABNORMAL LOW (ref 12.0–15.0)
Immature Granulocytes: 6 %
Lymphocytes Relative: 9 %
Lymphs Abs: 0.8 10*3/uL (ref 0.7–4.0)
MCH: 25.3 pg — ABNORMAL LOW (ref 26.0–34.0)
MCHC: 31.6 g/dL (ref 30.0–36.0)
MCV: 80.1 fL (ref 80.0–100.0)
Monocytes Absolute: 1.8 10*3/uL — ABNORMAL HIGH (ref 0.1–1.0)
Monocytes Relative: 18 %
Neutro Abs: 6.5 10*3/uL (ref 1.7–7.7)
Neutrophils Relative %: 66 %
Platelet Count: 42 10*3/uL — ABNORMAL LOW (ref 150–400)
RBC: 3.67 MIL/uL — ABNORMAL LOW (ref 3.87–5.11)
RDW: 23.4 % — ABNORMAL HIGH (ref 11.5–15.5)
WBC Count: 9.8 10*3/uL (ref 4.0–10.5)
nRBC: 0 % (ref 0.0–0.2)

## 2022-03-14 LAB — SAMPLE TO BLOOD BANK

## 2022-03-14 MED ORDER — ROMIPLOSTIM INJECTION 500 MCG
400.0000 ug | Freq: Once | SUBCUTANEOUS | Status: AC
  Administered 2022-03-14: 400 ug via SUBCUTANEOUS
  Filled 2022-03-14: qty 0.8

## 2022-03-14 MED ORDER — CYANOCOBALAMIN 1000 MCG/ML IJ SOLN
1000.0000 ug | Freq: Once | INTRAMUSCULAR | Status: AC
  Administered 2022-03-14: 1000 ug via INTRAMUSCULAR
  Filled 2022-03-14: qty 1

## 2022-03-14 MED ORDER — HEPARIN SOD (PORK) LOCK FLUSH 100 UNIT/ML IV SOLN
500.0000 [IU] | Freq: Once | INTRAVENOUS | Status: AC
  Administered 2022-03-14: 500 [IU] via INTRAVENOUS

## 2022-03-14 MED ORDER — SODIUM CHLORIDE 0.9% FLUSH
10.0000 mL | INTRAVENOUS | Status: DC | PRN
  Administered 2022-03-14: 10 mL via INTRAVENOUS

## 2022-03-14 NOTE — Patient Instructions (Signed)
Romiplostim Injection What is this medication? ROMIPLOSTIM (roe mi PLOE stim) treats low levels of platelets in your body caused by immune thrombocytopenia (ITP). It is prescribed when other medications have not worked or cannot be tolerated. It may also be used to help people who have been exposed to high doses of radiation. It works by increasing the amount of platelets in your blood. This lowers the risk of bleeding. This medicine may be used for other purposes; ask your health care provider or pharmacist if you have questions. COMMON BRAND NAME(S): Nplate What should I tell my care team before I take this medication? They need to know if you have any of these conditions: Blood clots Myelodysplastic syndrome An unusual or allergic reaction to romiplostim, mannitol, other medications, foods, dyes, or preservatives Pregnant or trying to get pregnant Breast-feeding How should I use this medication? This medication is injected under the skin. It is given by a care team in a hospital or clinic setting. A special MedGuide will be given to you before each treatment. Be sure to read this information carefully each time. Talk to your care team about the use of this medication in children. While it may be prescribed for children as young as newborns for selected conditions, precautions do apply. Overdosage: If you think you have taken too much of this medicine contact a poison control center or emergency room at once. NOTE: This medicine is only for you. Do not share this medicine with others. What if I miss a dose? Keep appointments for follow-up doses. It is important not to miss your dose. Call your care team if you are unable to keep an appointment. What may interact with this medication? Interactions are not expected. This list may not describe all possible interactions. Give your health care provider a list of all the medicines, herbs, non-prescription drugs, or dietary supplements you use. Also  tell them if you smoke, drink alcohol, or use illegal drugs. Some items may interact with your medicine. What should I watch for while using this medication? Visit your care team for regular checks on your progress. You may need blood work done while you are taking this medication. Your condition will be monitored carefully while you are receiving this medication. It is important not to miss any appointments. What side effects may I notice from receiving this medication? Side effects that you should report to your care team as soon as possible: Allergic reactions--skin rash, itching, hives, swelling of the face, lips, tongue, or throat Blood clot--pain, swelling, or warmth in the leg, shortness of breath, chest pain Side effects that usually do not require medical attention (report to your care team if they continue or are bothersome): Dizziness Joint pain Muscle pain Pain in the hands or feet Stomach pain Trouble sleeping This list may not describe all possible side effects. Call your doctor for medical advice about side effects. You may report side effects to FDA at 1-800-FDA-1088. Where should I keep my medication? This medication is given in a hospital or clinic. It will not be stored at home. NOTE: This sheet is a summary. It may not cover all possible information. If you have questions about this medicine, talk to your doctor, pharmacist, or health care provider.  2023 Elsevier/Gold Standard (2021-09-07 00:00:00)  

## 2022-03-16 ENCOUNTER — Ambulatory Visit: Payer: Medicare HMO | Admitting: Family Medicine

## 2022-03-17 DIAGNOSIS — Z7984 Long term (current) use of oral hypoglycemic drugs: Secondary | ICD-10-CM | POA: Diagnosis not present

## 2022-03-17 DIAGNOSIS — E119 Type 2 diabetes mellitus without complications: Secondary | ICD-10-CM | POA: Diagnosis not present

## 2022-03-17 DIAGNOSIS — I10 Essential (primary) hypertension: Secondary | ICD-10-CM | POA: Diagnosis not present

## 2022-03-17 DIAGNOSIS — Z9221 Personal history of antineoplastic chemotherapy: Secondary | ICD-10-CM | POA: Diagnosis not present

## 2022-03-17 DIAGNOSIS — C9310 Chronic myelomonocytic leukemia not having achieved remission: Secondary | ICD-10-CM | POA: Diagnosis not present

## 2022-03-17 DIAGNOSIS — R69 Illness, unspecified: Secondary | ICD-10-CM | POA: Diagnosis not present

## 2022-03-17 DIAGNOSIS — M549 Dorsalgia, unspecified: Secondary | ICD-10-CM | POA: Diagnosis not present

## 2022-03-17 DIAGNOSIS — E538 Deficiency of other specified B group vitamins: Secondary | ICD-10-CM | POA: Diagnosis not present

## 2022-03-17 DIAGNOSIS — Z9884 Bariatric surgery status: Secondary | ICD-10-CM | POA: Diagnosis not present

## 2022-03-19 ENCOUNTER — Other Ambulatory Visit: Payer: Self-pay | Admitting: Hematology

## 2022-03-21 ENCOUNTER — Inpatient Hospital Stay: Payer: Medicare HMO

## 2022-03-21 ENCOUNTER — Other Ambulatory Visit: Payer: Self-pay

## 2022-03-21 VITALS — BP 122/78 | HR 92 | Temp 98.2°F | Resp 18

## 2022-03-21 DIAGNOSIS — D696 Thrombocytopenia, unspecified: Secondary | ICD-10-CM

## 2022-03-21 DIAGNOSIS — Z5111 Encounter for antineoplastic chemotherapy: Secondary | ICD-10-CM | POA: Diagnosis not present

## 2022-03-21 DIAGNOSIS — E119 Type 2 diabetes mellitus without complications: Secondary | ICD-10-CM | POA: Diagnosis not present

## 2022-03-21 DIAGNOSIS — D693 Immune thrombocytopenic purpura: Secondary | ICD-10-CM | POA: Diagnosis not present

## 2022-03-21 DIAGNOSIS — Z7952 Long term (current) use of systemic steroids: Secondary | ICD-10-CM | POA: Diagnosis not present

## 2022-03-21 DIAGNOSIS — R69 Illness, unspecified: Secondary | ICD-10-CM | POA: Diagnosis not present

## 2022-03-21 DIAGNOSIS — Z79899 Other long term (current) drug therapy: Secondary | ICD-10-CM | POA: Diagnosis not present

## 2022-03-21 DIAGNOSIS — C9310 Chronic myelomonocytic leukemia not having achieved remission: Secondary | ICD-10-CM | POA: Diagnosis not present

## 2022-03-21 DIAGNOSIS — Z9884 Bariatric surgery status: Secondary | ICD-10-CM | POA: Diagnosis not present

## 2022-03-21 DIAGNOSIS — K5900 Constipation, unspecified: Secondary | ICD-10-CM | POA: Diagnosis not present

## 2022-03-21 DIAGNOSIS — E785 Hyperlipidemia, unspecified: Secondary | ICD-10-CM | POA: Diagnosis not present

## 2022-03-21 DIAGNOSIS — Z95828 Presence of other vascular implants and grafts: Secondary | ICD-10-CM

## 2022-03-21 DIAGNOSIS — Z7984 Long term (current) use of oral hypoglycemic drugs: Secondary | ICD-10-CM | POA: Diagnosis not present

## 2022-03-21 DIAGNOSIS — I10 Essential (primary) hypertension: Secondary | ICD-10-CM | POA: Diagnosis not present

## 2022-03-21 LAB — CBC WITH DIFFERENTIAL (CANCER CENTER ONLY)
Abs Immature Granulocytes: 0.03 10*3/uL (ref 0.00–0.07)
Basophils Absolute: 0 10*3/uL (ref 0.0–0.1)
Basophils Relative: 0 %
Eosinophils Absolute: 0 10*3/uL (ref 0.0–0.5)
Eosinophils Relative: 0 %
HCT: 29 % — ABNORMAL LOW (ref 36.0–46.0)
Hemoglobin: 9.2 g/dL — ABNORMAL LOW (ref 12.0–15.0)
Immature Granulocytes: 1 %
Lymphocytes Relative: 11 %
Lymphs Abs: 0.6 10*3/uL — ABNORMAL LOW (ref 0.7–4.0)
MCH: 26.1 pg (ref 26.0–34.0)
MCHC: 31.7 g/dL (ref 30.0–36.0)
MCV: 82.4 fL (ref 80.0–100.0)
Monocytes Absolute: 1 10*3/uL (ref 0.1–1.0)
Monocytes Relative: 19 %
Neutro Abs: 3.6 10*3/uL (ref 1.7–7.7)
Neutrophils Relative %: 69 %
Platelet Count: 56 10*3/uL — ABNORMAL LOW (ref 150–400)
RBC: 3.52 MIL/uL — ABNORMAL LOW (ref 3.87–5.11)
RDW: 25.4 % — ABNORMAL HIGH (ref 11.5–15.5)
WBC Count: 5.3 10*3/uL (ref 4.0–10.5)
nRBC: 0 % (ref 0.0–0.2)

## 2022-03-21 LAB — COMPREHENSIVE METABOLIC PANEL
ALT: 19 U/L (ref 0–44)
AST: 19 U/L (ref 15–41)
Albumin: 3.2 g/dL — ABNORMAL LOW (ref 3.5–5.0)
Alkaline Phosphatase: 113 U/L (ref 38–126)
Anion gap: 6 (ref 5–15)
BUN: 18 mg/dL (ref 8–23)
CO2: 25 mmol/L (ref 22–32)
Calcium: 8.4 mg/dL — ABNORMAL LOW (ref 8.9–10.3)
Chloride: 109 mmol/L (ref 98–111)
Creatinine, Ser: 0.71 mg/dL (ref 0.44–1.00)
GFR, Estimated: >60 mL/min (ref >60)
Glucose, Bld: 170 mg/dL — ABNORMAL HIGH (ref 70–99)
Potassium: 4.1 mmol/L (ref 3.5–5.1)
Sodium: 140 mmol/L (ref 135–145)
Total Bilirubin: 0.9 mg/dL (ref 0.3–1.2)
Total Protein: 6.4 g/dL — ABNORMAL LOW (ref 6.5–8.1)

## 2022-03-21 MED ORDER — ROMIPLOSTIM INJECTION 500 MCG
400.0000 ug | Freq: Once | SUBCUTANEOUS | Status: AC
  Administered 2022-03-21: 400 ug via SUBCUTANEOUS
  Filled 2022-03-21: qty 0.8

## 2022-03-22 ENCOUNTER — Encounter: Payer: Self-pay | Admitting: Hematology

## 2022-03-22 ENCOUNTER — Other Ambulatory Visit: Payer: Self-pay | Admitting: Hematology

## 2022-03-25 MED FILL — Dexamethasone Sodium Phosphate Inj 100 MG/10ML: INTRAMUSCULAR | Qty: 1 | Status: AC

## 2022-03-27 NOTE — Progress Notes (Unsigned)
[facility]   Telephone:(336) [phone] Fax:(336) [phone]   Clinic Follow up Note   Patient Care Team: Natalia Leatherwood, DO as PCP - General (Family Medicine) Myrtie Neither, Andreas Blower, MD as Consulting Physician (Gastroenterology) Malachy Mood, MD as Consulting Physician (Hematology) Glendale Chard, DO as Consulting Physician (Neurology) 03/28/2022  CHIEF COMPLAINT: Follow up [name]  SUMMARY OF ONCOLOGIC HISTORY: Oncology History  [name] (chronic myelomonocytic leukemia) (HCC)  10/29/2020 Imaging   CT CAP  IMPRESSION: 1. Splenomegaly with pathologically enlarged lymph nodes above and below the diaphragm, with overall stable to minimally increased abdominal adenopathy and interval progression of the pelvic adenopathy. 2. Small volume abdominopelvic ascites with diffuse mesenteric stranding. 3. Scattered bilateral pulmonary micro nodules measuring 1-2 mm. 4. Distended gallbladder with some layering hyperdense material representing layering sludge and tiny stones seen on prior ultrasound. 5. Aortic atherosclerosis.   10/30/2020 Pathology Results   DIAGNOSIS:   BONE MARROW, ASPIRATE, CLOT, CORE:  -  Hypercellular bone marrow with panhyperplasia, atypia, and no  increase in blasts  -  See comment   PERIPHERAL BLOOD:  -  Marked thrombocytopenia  -  Absolute monocytosis  -  Normocytic anemia  -  See CBC data and comment   COMMENT:  The bone marrow is hypercellular for age (approximately 80%) with myeloid hyperplasia with maturational left shift, erythroid hyperplasia, and increased megakaryocytes.  Mild multilineage atypia is present. Blasts are not increased on aspirate smears (1% by manual differential count) or by CD34 immunohistochemistry on the core biopsy.  Concurrent flow cytometric analysis of the bone marrow aspirate demonstrates increased monocytes, and no increase in blasts or abnormal lymphoid population (see [id number]).  Monocytes are also increased in peripheral   blood, persistent per electronic medical record.  In aggregate, the  findings raise the possibility of a myeloid neoplasm with the  differential diagnosis including a low-grade myelodysplastic syndrome and chronic myelomonocytic leukemia (dysplastic type).    ADDENDUM:  A reticulin special stain performed on the bone marrow core biopsy reveals no significant increase in reticulin fibrosis.  ADDENDUM:  CYTOGENETIC RESULTS:  Karyotype: 46,XX[20]  Interpretation: NORMAL FEMALE KARYOTYPE   FISH RESULTS:  Results: NORMAL   ADDENDUM:  CD123 immunohistochemistry performed on the core biopsy highlights scattered aggregates of positively staining cells consistent with plasmacytoid dendritic cells.    10/30/2020 Pathology Results   DIAGNOSIS:   BONE MARROW; FLOW CYTOMETRIC ANALYSIS:  -  Increased monocytes  -  Scant B-cells present  -  No immunophenotypically aberrant T-cell population identified  -  No increase in blasts  -  See comment   COMMENT:  Monocytes are relatively increased (12% of all cells), without aberrant expression of CD56.  B-cells comprise <1% of total lymphocytes. CD34-positive blasts are not increased (<1% of all cells).  Correlation with concurrent morphology is recommended for complete diagnostic interpretation and overall blast enumeration (see [id number]).    10/30/2020 Pathology Results   FINAL MICROSCOPIC DIAGNOSIS:   A. LYMPH NODE, RIGHT AXILLARY, NEEDLE CORE BIOPSY:  -Lymphoid tissue present  -See comment   COMMENT:  The sections show several small needle core biopsy fragments of lymphoid tissue displaying degenerative cellular changes/necrosis and hence cannot be accurately evaluated.  Sample for flow cytometric analysis not available.  Immunohistochemical stain for CD3 and CD20 were performed with appropriate controls.  There is a mixture of T and B cells in their apparently respective compartments.  There is no definite metastatic malignancy.    11/11/2020  Pathology Results  DIAGNOSIS:   LEFT AXILLARY LYMPH NODE EXCISIONAL BIOPSY; FLOW CYTOMETRIC ANALYSIS:  -  No monotypic B-cell or immunophenotypically aberrant T-cell  population identified  -  See comment   COMMENT:  Flow cytometric analysis identifies B-cells with a normal kappa:lambda ratio of 1.7:1.  A subset of the polytypic B-cells expresses CD10. T-cells show a CD4:CD8 ratio of 3.3:1 without immunophenotypic aberrancy with the markers evaluated.  Although these results do not support the diagnosis of a clonal lymphoid population, sampling issues must always be considered when negative results are obtained, as focal lesions may not be represented in the specimen submitted.  In addition, flow cytometric immunophenotyping will not exclude other pathology if present (e.g. Hodgkin lymphoma, some T-cell lymphomas, metastatic and infectious diseases).   11/11/2020 Pathology Results   FINAL MICROSCOPIC DIAGNOSIS:   A. LYMPH NODE, LEFT AXILLARY, DISSECTION:  -  Follicular hyperplasia with interfollicular expansion  -  See comment   COMMENT:  Sections of the lymph nodes reveal generally preserved lymph node architecture.  There is follicular hyperplasia with some follicles showing increased hyalinization of germinal centers and mild concentric encircling of germinal centers with small lymphocytes (onion skinning). Interfollicular areas are expanded with increased vascular proliferation, small lymphocytes, plasma cells, histiocytic cells, and patchy neutrophils.  The lymph node capsule is variably thickened by fibrosis with few plasma cells.   A panel of immunohistochemical stains is performed for further  characterization.  CD3 and CD20/PAX5 highlight T-cell and B-cell  compartments, respectively.  Germinal centers are highlighted by CD10 and BCL6 with appropriate absent expression of BCL2.  CD5 is similar to CD3.  CD43 also stains the T-cells.  CD4-positive T-cells exceed CD8-positive T-cells.   CD30 highlights scattered immunoblasts.  CD21 reveals intact follicular dendritic cell meshworks.  CD68 highlights increased histiocytic cells.  HHV 8 is negative.  T. pallidum reveals no definitive organisms.  Pancytokeratin is negative.  TdT stains rare cells.  CD138 highlights plasma cells which are not increased in number and show polytypic light chain expression by kappa/lambda in situ  hybridization.  EBV is negative by in situ hybridization.   Concurrent flow cytometric analysis is negative for a monoclonal B-cell or immunophenotypically aberrant T-cell population (see [id number]).   Together, the findings above are non-specific and can be seen in  reactive and infectious processes as well as Castleman disease.  There is no definitive morphologic or flow cytometric evidence of involvement by a lymphoproliferative disorder from the current workup.   12/17/2020 Initial Diagnosis   [name] (chronic myelomonocytic leukemia) ([place])   12/28/2020 - 02/03/2022 Chemotherapy   Patient is on Treatment Plan : MYELODYSPLASIA  Azacitidine IV D1-5 q28d     03/18/2021 Pathology Results   Final Diagnosis    BONE MARROW:             Persistent chronic myelomonocytic leukemia in a hypercellular bone marrow with increased monocytes and no increase in blasts.   Comment    Flow cytometric analysis showed no increase in blasts and 17% monocytes.A next generation myeloid panel showed the presence of the following mutations: NRAS, SH2B3, PHF6 and TET2. CD34 and CD117 immunstains do not show an increase in blasts. The findings are consistent with persistent [name] with a decrease in the number of blasts compared to that seen on the previous bone marrow.    Final Interpretation      BONE MARROW; FLOW CYTOMETRIC ANALYSIS:   No increased blasts identified. (see comment)  Monocytosis (17% of total events).    COMMENT:  Flow cytometry identified about 0.6% of total events as immature cells. These cells express CD45 (dim),  CD34, CD117, HLA-DR, CD38, CD33 (variable). This immunophenotype is consistent with normal myeloblasts. In addition, monocytes are increased and comprise of approximately 17% of total events. These monocytes show normal expression of CD33/CD64/CD14/CD11b with variable expression of CD4. Correlation with concurrent morphology and clinical data is recommended (see [id number]).    FLOW CYTOMETRY ANALYSIS: CD45 versus side scatter analysis demonstrate a predominance of granulocytes (~70%), monocytes (~17%) and lymphocytes (~6%). No significant increase in blasts identified. All tested markers were used for adequate analysis of the cells and were appropriately reviewed.     12/28/2021 -  Chemotherapy   Patient is on Treatment Plan : MYELODYSPLASIA  Azacitidine IV D1-5 q28d       CURRENT THERAPY:  Azacitidine days 1-5 q28 days, started 12/28/20 Nplate 14mcg/kg weekly, dose reduced since 07/19/21 when plt normalized, now 5 mcg/kg Platelet transfusion as needed (plt<15K), blood transfusion if Hg</= 7.5  INTERVAL HISTORY: Ms. Venables returns for follow up as scheduled, last seen by Dr. Burr Medico 9/18 and began another cycle of vidaza. She was seen by PA/Dr. Linus Orn at [place] 03/17/22 who recommended to continue treatment and return in 8 weeks. She also continues weekly Nplate.  Denies bruising or bleeding.  She feels well overall except more fatigue with treatments.  She was also slightly nauseous this time but did not require medication.  She is dealing with a bout of constipation, managing with senna.  Denies fever, chills, cough, chest pain, dyspnea.  She is able to recover well after treatments.  Plans to go to [place] after next cycle.  All other systems were reviewed with the patient and are negative.  MEDICAL HISTORY:  Past Medical History:  Diagnosis Date   Diabetes ([place])    HLD (hyperlipidemia)    Hypertension    Pernicious anemia    Pneumonia 09/05/2019   Renal insufficiency 09/06/2019   Vitamin  D deficiency     SURGICAL HISTORY: Past Surgical History:  Procedure Laterality Date   ABDOMINAL HERNIA REPAIR  2009   ABDOMINOPLASTY     AXILLARY LYMPH NODE BIOPSY Left 11/11/2020   Procedure: LEFT AXILLARY EXCISIONAL LYMPH NODE BIOPSY;  Surgeon: [name], MD;  Location: [facility];  Service: General;  Laterality: Left;   [place]   GASTRIC BYPASS  2000   hemorroid surgery  2007   IR IMAGING GUIDED PORT INSERTION  07/26/2021   IR KYPHO EA ADDL LEVEL THORACIC OR LUMBAR  05/31/2021   IR KYPHO THORACIC WITH BONE BIOPSY  05/31/2021   TONSILLECTOMY AND ADENOIDECTOMY  1957    I have reviewed the social history and family history with the patient and they are unchanged from previous note.  ALLERGIES:  is allergic to asa [aspirin], nsaids, and red blood cells.  MEDICATIONS:  Current Outpatient Medications  Medication Sig Dispense Refill   cyanocobalamin (,VITAMIN B-12,) 1000 MCG/ML injection Inject 1,000 mcg into the muscle every 30 (thirty) days.     cyclobenzaprine (FLEXERIL) 5 MG tablet Take 1 tablet (5 mg total) by mouth 3 (three) times daily as needed for muscle spasms. 30 tablet 0   furosemide (LASIX) 20 MG tablet TAKE 1 TABLET BY MOUTH EVERY DAY FOR UP TO 7 DAYS THEN AS NEEDED FOR LEG EDEMA 90 tablet 1   glucose blood (ONETOUCH VERIO) test strip Monitor blood sugars twice daily 100 each 5   KLOR-CON M10 10 MEQ tablet TAKE 1  TABLET BY MOUTH TWICE A DAY TAKE ALONG WITH FUROSEMIDE ONLY 180 tablet 1   Lancets (ONETOUCH ULTRASOFT) lancets Monitor blood sugars twice daily 100 each 5   lidocaine-prilocaine (EMLA) cream APPLY 2 GRAMS TO PORT-A-CATH SITE 30-60 MINUTES PRIOR TO PORT ACCESS AS NEEDED 30 g 1   metoprolol succinate (TOPROL-XL) 25 MG 24 hr tablet Take 1 tablet (25 mg total) by mouth daily. 90 tablet 1   naloxone (NARCAN) nasal spray 4 mg/0.1 mL Place 1 spray into the nose once as needed (overdose). (Patient not taking: Reported on 12/01/2021) 2 each 0   ondansetron  (ZOFRAN-ODT) 4 MG disintegrating tablet Take 1 tablet (4 mg total) by mouth every 8 (eight) hours as needed for nausea or vomiting. 20 tablet 0   Oxycodone HCl 10 MG TABS 1 tab every 4-6 hr prn for chronic pain 120 tablet 0   Oxycodone HCl 10 MG TABS 1 tab every 4-6 hours as needed for pain 120 tablet 0   polyethylene glycol powder (GLYCOLAX/MIRALAX) 17 GM/SCOOP powder Dissolve 1 capful (17 g) in water and drink 2 (two) times daily. (Patient taking differently: Take 17 g by mouth daily.) 510 g 2   pregabalin (LYRICA) 150 MG capsule Take 1 capsule (150 mg total) by mouth 2 (two) times daily. 180 capsule 1   senna-docusate (SENOKOT-S) 8.6-50 MG tablet Take 2 tablets by mouth at bedtime as needed for mild constipation. 60 tablet 1   sitaGLIPtin-metformin (JANUMET) 50-1000 MG tablet Take 1 tablet by mouth at bedtime. 90 tablet 1   No current facility-administered medications for this visit.   Facility-Administered Medications Ordered in Other Visits  Medication Dose Route Frequency Provider Last Rate Last Admin   0.9 %  sodium chloride infusion (Manually program via Guardrails IV Fluids)  250 mL Intravenous Once Truitt Merle, MD       sodium chloride flush (NS) 0.9 % injection 10 mL  10 mL Intracatheter PRN Truitt Merle, MD   10 mL at 03/28/22 1246    PHYSICAL EXAMINATION: ECOG PERFORMANCE STATUS: 1 - Symptomatic but completely ambulatory  Vitals:   03/28/22 1028  BP: 129/83  Pulse: 91  Resp: 17  Temp: 98.1 F (36.7 C)  SpO2: 96%   Filed Weights   03/28/22 1028  Weight: 147 lb 9.6 oz (67 kg)    GENERAL:alert, no distress and comfortable SKIN: no rash  EYES:  sclera clear LUNGS: clear with normal breathing effort HEART: regular rate & rhythm, + murmur, and trace bilateral ankle edema NEURO: alert & oriented x 3 with fluent speech PAC without erythema  LABORATORY DATA:  I have reviewed the data as listed    Latest Ref Rng & Units 03/28/2022   10:02 AM 03/21/2022    8:31 AM 03/14/2022     8:18 AM  CBC  WBC 4.0 - 10.5 K/uL 3.9  5.3  9.8   Hemoglobin 12.0 - 15.0 g/dL 9.4  9.2  9.3   Hematocrit 36.0 - 46.0 % 30.0  29.0  29.4   Platelets 150 - 400 K/uL 99  56  42         Latest Ref Rng & Units 03/28/2022   10:02 AM 03/21/2022    8:31 AM 03/14/2022    8:18 AM  CMP  Glucose 70 - 99 mg/dL 200  170  124   BUN 8 - 23 mg/dL $Remove'17  18  13   'VQbPhpm$ Creatinine 0.44 - 1.00 mg/dL 0.73  0.71  0.66   Sodium 135 -  145 mmol/L 140  140  140   Potassium 3.5 - 5.1 mmol/L 4.0  4.1  4.0   Chloride 98 - 111 mmol/L 107  109  109   CO2 22 - 32 mmol/L $RemoveB'29  25  26   'jJgpqkyo$ Calcium 8.9 - 10.3 mg/dL 8.6  8.4  8.3   Total Protein 6.5 - 8.1 g/dL  6.4  6.0   Total Bilirubin 0.3 - 1.2 mg/dL  0.9  0.9   Alkaline Phos 38 - 126 U/L  113  107   AST 15 - 41 U/L  19  21   ALT 0 - 44 U/L  19  22       RADIOGRAPHIC STUDIES: I have personally reviewed the radiological images as listed and agreed with the findings in the report. No results found.   ASSESSMENT & PLAN: Kathleen Gibson is a 71 y.o. female with   1. [name] with severe thrombocytopenia  -Initially diagnosed with ITP in 2014. She lost follow up after 11/2017 and did not proceed with recommended bone marrow biopsy. -She was referred to ED 10/28/20 by her PCP for low plt count of 4k. She was hospitalized and treated with platelet transfusions, IVIG, oral prednisone $RemoveBeforeD'60mg'aCsQalDfHZpwUT$  and Nplate injections. She tapered off prednisone given little response. -Bone marrow biopsy 10/30/20 felt to likely represent MDS/MPN, particularly [name].  Confirmed on slide review at [facility] by Dr. Linus Orn -She began treatment for severe refractory thrombocytopenia with weekly Rituxan x4 on 11/19/20, she did not respond  -She began azacitidine daily days 1-5 q. 28 days on 12/28/2020 -She had worsening thrombocytopenia after stopping Nplate, restarted weekly Nplate 10 mcg/kg on 06/15/1592.  Dose adjustment per platelet level, currently 5 mcg/kg -Bone marrow biopsy 03/18/2021 showed stable  disease, no increase in blasts. -She continues azacitidine daily days 1-5 q. 28 days.  -I reviewed her recent visit with Dr. Linus Orn who recommends to continue same treatment plan for now, next follow-up in 2 months -Ms. Daus appears stable, she continues Vidaza on days 1-5 q. 28 days, tolerating well except moderate fatigue. This past cycle she has mild nausea and constipation.  Side effects are adequately managed with supportive care at home.  She is able to recover and function well. -Labs reviewed, Hgb 9.3, platelet 99K on weekly Nplate.  Adequate to proceed with day 1 Vidaza today as scheduled, with Nplate.  Per Dr. Burr Medico to hold Nplate after today to see how platelets hold on their own -She will return for weekly lab, continue monthly B12, next due in 2 weeks -Follow-up in 4 weeks with next cycle   2.  T7/T8 compression fracture -Platelet count improved, she was able to undergo T10 and T12 kyphoplasty 05/31/2021  -No longer using thoracic brace -Pain managed on rare use of oxycodone -Restart calcium and vitamin D -She has not been able to find a dentist who takes Medicare for Zometa clearance, I previously referred her to [facility] Schick, [place]. Zometa remains on hold   3. Normocytic anemia, moderate  -H/o pernicious anemia/vitamin B12 deficiency -Transfuse for Hgb </= 7.5 -Continue oral iron and monthly B12 inj   4. Type 2 diabetes mellitus with polyneuropathy, Hypertension, anxiety -Continue medication, follow-up PCP -Ativan as needed   PLAN: -Dr. Romana Juniper notes and today's Labs reviewed -Proceed with Vidaza today as planned, and N plate today then hold for now per Dr. Burr Medico -Weekly lab -Monthly B12, last given 10/2 -Follow-up in 4 weeks with next cycle   All questions were  answered. The patient knows to call the clinic with any problems, questions or concerns. No barriers to learning was detected. I spent 20 minutes counseling the patient face to face. The total time spent in the  appointment was 30 minutes and more than 50% was on counseling and review of test results and coordination of care.     Alla Feeling, NP 03/28/22

## 2022-03-28 ENCOUNTER — Inpatient Hospital Stay: Payer: Medicare HMO

## 2022-03-28 ENCOUNTER — Encounter: Payer: Self-pay | Admitting: Nurse Practitioner

## 2022-03-28 ENCOUNTER — Inpatient Hospital Stay (HOSPITAL_BASED_OUTPATIENT_CLINIC_OR_DEPARTMENT_OTHER): Payer: Medicare HMO | Admitting: Nurse Practitioner

## 2022-03-28 DIAGNOSIS — C9310 Chronic myelomonocytic leukemia not having achieved remission: Secondary | ICD-10-CM

## 2022-03-28 DIAGNOSIS — Z7984 Long term (current) use of oral hypoglycemic drugs: Secondary | ICD-10-CM | POA: Diagnosis not present

## 2022-03-28 DIAGNOSIS — D696 Thrombocytopenia, unspecified: Secondary | ICD-10-CM

## 2022-03-28 DIAGNOSIS — K5900 Constipation, unspecified: Secondary | ICD-10-CM | POA: Diagnosis not present

## 2022-03-28 DIAGNOSIS — Z5111 Encounter for antineoplastic chemotherapy: Secondary | ICD-10-CM | POA: Diagnosis not present

## 2022-03-28 DIAGNOSIS — R69 Illness, unspecified: Secondary | ICD-10-CM | POA: Diagnosis not present

## 2022-03-28 DIAGNOSIS — Z9884 Bariatric surgery status: Secondary | ICD-10-CM | POA: Diagnosis not present

## 2022-03-28 DIAGNOSIS — Z79899 Other long term (current) drug therapy: Secondary | ICD-10-CM | POA: Diagnosis not present

## 2022-03-28 DIAGNOSIS — Z7952 Long term (current) use of systemic steroids: Secondary | ICD-10-CM | POA: Diagnosis not present

## 2022-03-28 DIAGNOSIS — D693 Immune thrombocytopenic purpura: Secondary | ICD-10-CM | POA: Diagnosis not present

## 2022-03-28 DIAGNOSIS — Z95828 Presence of other vascular implants and grafts: Secondary | ICD-10-CM

## 2022-03-28 DIAGNOSIS — E119 Type 2 diabetes mellitus without complications: Secondary | ICD-10-CM | POA: Diagnosis not present

## 2022-03-28 DIAGNOSIS — E785 Hyperlipidemia, unspecified: Secondary | ICD-10-CM | POA: Diagnosis not present

## 2022-03-28 DIAGNOSIS — I10 Essential (primary) hypertension: Secondary | ICD-10-CM | POA: Diagnosis not present

## 2022-03-28 LAB — CBC WITH DIFFERENTIAL (CANCER CENTER ONLY)
Abs Immature Granulocytes: 0.03 10*3/uL (ref 0.00–0.07)
Basophils Absolute: 0 10*3/uL (ref 0.0–0.1)
Basophils Relative: 1 %
Eosinophils Absolute: 0 10*3/uL (ref 0.0–0.5)
Eosinophils Relative: 1 %
HCT: 30 % — ABNORMAL LOW (ref 36.0–46.0)
Hemoglobin: 9.4 g/dL — ABNORMAL LOW (ref 12.0–15.0)
Immature Granulocytes: 1 %
Lymphocytes Relative: 18 %
Lymphs Abs: 0.7 10*3/uL (ref 0.7–4.0)
MCH: 25.8 pg — ABNORMAL LOW (ref 26.0–34.0)
MCHC: 31.3 g/dL (ref 30.0–36.0)
MCV: 82.4 fL (ref 80.0–100.0)
Monocytes Absolute: 0.9 10*3/uL (ref 0.1–1.0)
Monocytes Relative: 24 %
Neutro Abs: 2.2 10*3/uL (ref 1.7–7.7)
Neutrophils Relative %: 55 %
Platelet Count: 99 10*3/uL — ABNORMAL LOW (ref 150–400)
RBC: 3.64 MIL/uL — ABNORMAL LOW (ref 3.87–5.11)
RDW: 24.8 % — ABNORMAL HIGH (ref 11.5–15.5)
WBC Count: 3.9 10*3/uL — ABNORMAL LOW (ref 4.0–10.5)
nRBC: 0 % (ref 0.0–0.2)

## 2022-03-28 LAB — BASIC METABOLIC PANEL - CANCER CENTER ONLY
Anion gap: 4 — ABNORMAL LOW (ref 5–15)
BUN: 17 mg/dL (ref 8–23)
CO2: 29 mmol/L (ref 22–32)
Calcium: 8.6 mg/dL — ABNORMAL LOW (ref 8.9–10.3)
Chloride: 107 mmol/L (ref 98–111)
Creatinine: 0.73 mg/dL (ref 0.44–1.00)
GFR, Estimated: >60 mL/min (ref >60)
Glucose, Bld: 200 mg/dL — ABNORMAL HIGH (ref 70–99)
Potassium: 4 mmol/L (ref 3.5–5.1)
Sodium: 140 mmol/L (ref 135–145)

## 2022-03-28 LAB — SAMPLE TO BLOOD BANK

## 2022-03-28 MED ORDER — SODIUM CHLORIDE 0.9 % IV SOLN: Freq: Once | INTRAVENOUS | Status: AC

## 2022-03-28 MED ORDER — SENNOSIDES-DOCUSATE SODIUM 8.6-50 MG PO TABS: 2.0000 | ORAL_TABLET | Freq: Every evening | ORAL | 1 refills | Status: DC | PRN

## 2022-03-28 MED ORDER — SODIUM CHLORIDE 0.9% FLUSH
10.0000 mL | INTRAVENOUS | Status: DC | PRN
  Administered 2022-03-28: 10 mL

## 2022-03-28 MED ORDER — SODIUM CHLORIDE 0.9 % IV SOLN
10.0000 mg | Freq: Once | INTRAVENOUS | Status: AC
  Administered 2022-03-28: 10 mg via INTRAVENOUS
  Filled 2022-03-28: qty 10

## 2022-03-28 MED ORDER — ROMIPLOSTIM INJECTION 500 MCG
6.0000 ug/kg | Freq: Once | SUBCUTANEOUS | Status: AC
  Administered 2022-03-28: 400 ug via SUBCUTANEOUS
  Filled 2022-03-28: qty 0.8

## 2022-03-28 MED ORDER — HEPARIN SOD (PORK) LOCK FLUSH 100 UNIT/ML IV SOLN
500.0000 [IU] | Freq: Once | INTRAVENOUS | Status: AC | PRN
  Administered 2022-03-28: 500 [IU]

## 2022-03-28 MED ORDER — SODIUM CHLORIDE 0.9% FLUSH
10.0000 mL | Freq: Once | INTRAVENOUS | Status: AC
  Administered 2022-03-28: 10 mL

## 2022-03-28 MED ORDER — CYANOCOBALAMIN 1000 MCG/ML IJ SOLN: 1000.0000 ug | Freq: Once | INTRAMUSCULAR | Status: DC

## 2022-03-28 MED ORDER — SODIUM CHLORIDE 0.9 % IV SOLN
75.0000 mg/m2 | Freq: Once | INTRAVENOUS | Status: AC
  Administered 2022-03-28: 130 mg via INTRAVENOUS
  Filled 2022-03-28: qty 13

## 2022-03-28 MED FILL — Dexamethasone Sodium Phosphate Inj 100 MG/10ML: INTRAMUSCULAR | Qty: 1 | Status: AC

## 2022-03-28 NOTE — Addendum Note (Signed)
Addended by: Dicie Beam D on: 03/28/2022 12:49 PM   Modules accepted: Orders

## 2022-03-28 NOTE — Patient Instructions (Signed)
[facility] MEDICAL [facility]COLOGY  Discharge Instructions: Thank you for choosing [facility] to provide your oncology and hematology care.   If you have a lab appointment with the Cancer Center, please go directly to the Cancer Center and check in at the registration area.   Wear comfortable clothing and clothing appropriate for easy access to any Portacath or PICC line.   We strive to give you quality time with your provider. You may need to reschedule your appointment if you arrive late (15 or more minutes).  Arriving late affects you and other patients whose appointments are after yours.  Also, if you miss three or more appointments without notifying the office, you may be dismissed from the clinic at the provider's discretion.      For prescription refill requests, have your pharmacy contact our office and allow 72 hours for refills to be completed.    Today you received the following chemotherapy and/or immunotherapy agents: Vidaza      To help prevent nausea and vomiting after your treatment, we encourage you to take your nausea medication as directed.  BELOW ARE SYMPTOMS THAT SHOULD BE REPORTED IMMEDIATELY: *FEVER GREATER THAN 100.4 F (38 C) OR HIGHER *CHILLS OR SWEATING *NAUSEA AND VOMITING THAT IS NOT CONTROLLED WITH YOUR NAUSEA MEDICATION *UNUSUAL SHORTNESS OF BREATH *UNUSUAL BRUISING OR BLEEDING *URINARY PROBLEMS (pain or burning when urinating, or frequent urination) *BOWEL PROBLEMS (unusual diarrhea, constipation, pain near the anus) TENDERNESS IN MOUTH AND THROAT WITH OR WITHOUT PRESENCE OF ULCERS (sore throat, sores in mouth, or a toothache) UNUSUAL RASH, SWELLING OR PAIN  UNUSUAL VAGINAL DISCHARGE OR ITCHING   Items with * indicate a potential emergency and should be followed up as soon as possible or go to the Emergency Department if any problems should occur.  Please show the CHEMOTHERAPY ALERT CARD or IMMUNOTHERAPY ALERT CARD at check-in to the  Emergency Department and triage nurse.  Should you have questions after your visit or need to cancel or reschedule your appointment, please contact [facility] MEDICAL ONCOLOG[facility]  Dept: [phone]  and follow the prompts.  Office hours are 8:00 a.m. to 4:30 p.m. Monday - Friday. Please note that voicemails left after 4:00 p.m. may not be returned until the following business day.  We are closed weekends and major holidays. You have access to a nurse at all times for urgent questions. Please call the main number to the clinic Dept: [phone] and follow the prompts.   For any non-urgent questions, you may also contact your provider using MyChart. We now offer e-Visits for anyone 18 and older to request care online for non-urgent symptoms. For details visit [email or web address].   Also download the MyChart app! Go to the app store, search "MyChart", open the app, select [facility], and log in with your MyChart username and password.  Masks are optional in the cancer centers. If you would like for your care team to wear a mask while they are taking care of you, please let them know. You may have one support person who is at least 70 years old accompany you for your appointments. 

## 2022-03-28 NOTE — Progress Notes (Signed)
Per Regan Rakers, NP OK to proceed today with Plts 99 Per Lacie, NP give B12 in two weeks as scheduled.

## 2022-03-29 ENCOUNTER — Inpatient Hospital Stay: Payer: Medicare HMO

## 2022-03-29 ENCOUNTER — Other Ambulatory Visit: Payer: Self-pay

## 2022-03-29 VITALS — BP 110/81 | HR 80 | Temp 97.9°F | Resp 16

## 2022-03-29 DIAGNOSIS — C9310 Chronic myelomonocytic leukemia not having achieved remission: Secondary | ICD-10-CM

## 2022-03-29 DIAGNOSIS — K5900 Constipation, unspecified: Secondary | ICD-10-CM | POA: Diagnosis not present

## 2022-03-29 DIAGNOSIS — D693 Immune thrombocytopenic purpura: Secondary | ICD-10-CM | POA: Diagnosis not present

## 2022-03-29 DIAGNOSIS — E785 Hyperlipidemia, unspecified: Secondary | ICD-10-CM | POA: Diagnosis not present

## 2022-03-29 DIAGNOSIS — I10 Essential (primary) hypertension: Secondary | ICD-10-CM | POA: Diagnosis not present

## 2022-03-29 DIAGNOSIS — Z79899 Other long term (current) drug therapy: Secondary | ICD-10-CM | POA: Diagnosis not present

## 2022-03-29 DIAGNOSIS — Z7952 Long term (current) use of systemic steroids: Secondary | ICD-10-CM | POA: Diagnosis not present

## 2022-03-29 DIAGNOSIS — Z9884 Bariatric surgery status: Secondary | ICD-10-CM | POA: Diagnosis not present

## 2022-03-29 DIAGNOSIS — Z5111 Encounter for antineoplastic chemotherapy: Secondary | ICD-10-CM | POA: Diagnosis not present

## 2022-03-29 DIAGNOSIS — Z7984 Long term (current) use of oral hypoglycemic drugs: Secondary | ICD-10-CM | POA: Diagnosis not present

## 2022-03-29 DIAGNOSIS — R69 Illness, unspecified: Secondary | ICD-10-CM | POA: Diagnosis not present

## 2022-03-29 DIAGNOSIS — E119 Type 2 diabetes mellitus without complications: Secondary | ICD-10-CM | POA: Diagnosis not present

## 2022-03-29 MED ORDER — SODIUM CHLORIDE 0.9 % IV SOLN
75.0000 mg/m2 | Freq: Once | INTRAVENOUS | Status: AC
  Administered 2022-03-29: 130 mg via INTRAVENOUS
  Filled 2022-03-29: qty 13

## 2022-03-29 MED ORDER — SODIUM CHLORIDE 0.9% FLUSH
10.0000 mL | INTRAVENOUS | Status: DC | PRN
  Administered 2022-03-29: 10 mL

## 2022-03-29 MED ORDER — SODIUM CHLORIDE 0.9 % IV SOLN: Freq: Once | INTRAVENOUS | Status: AC

## 2022-03-29 MED ORDER — SODIUM CHLORIDE 0.9 % IV SOLN
10.0000 mg | Freq: Once | INTRAVENOUS | Status: AC
  Administered 2022-03-29: 10 mg via INTRAVENOUS
  Filled 2022-03-29: qty 10

## 2022-03-29 MED ORDER — HEPARIN SOD (PORK) LOCK FLUSH 100 UNIT/ML IV SOLN
500.0000 [IU] | Freq: Once | INTRAVENOUS | Status: AC | PRN
  Administered 2022-03-29: 500 [IU]

## 2022-03-29 MED FILL — Dexamethasone Sodium Phosphate Inj 100 MG/10ML: INTRAMUSCULAR | Qty: 1 | Status: AC

## 2022-03-29 NOTE — Patient Instructions (Signed)
[facility] MEDICAL [facility]COLOGY  Discharge Instructions: Thank you for choosing [facility] to provide your oncology and hematology care.   If you have a lab appointment with the Cancer Center, please go directly to the Cancer Center and check in at the registration area.   Wear comfortable clothing and clothing appropriate for easy access to any Portacath or PICC line.   We strive to give you quality time with your provider. You may need to reschedule your appointment if you arrive late (15 or more minutes).  Arriving late affects you and other patients whose appointments are after yours.  Also, if you miss three or more appointments without notifying the office, you may be dismissed from the clinic at the provider's discretion.      For prescription refill requests, have your pharmacy contact our office and allow 72 hours for refills to be completed.    Today you received the following chemotherapy and/or immunotherapy agents: Vidaza      To help prevent nausea and vomiting after your treatment, we encourage you to take your nausea medication as directed.  BELOW ARE SYMPTOMS THAT SHOULD BE REPORTED IMMEDIATELY: *FEVER GREATER THAN 100.4 F (38 C) OR HIGHER *CHILLS OR SWEATING *NAUSEA AND VOMITING THAT IS NOT CONTROLLED WITH YOUR NAUSEA MEDICATION *UNUSUAL SHORTNESS OF BREATH *UNUSUAL BRUISING OR BLEEDING *URINARY PROBLEMS (pain or burning when urinating, or frequent urination) *BOWEL PROBLEMS (unusual diarrhea, constipation, pain near the anus) TENDERNESS IN MOUTH AND THROAT WITH OR WITHOUT PRESENCE OF ULCERS (sore throat, sores in mouth, or a toothache) UNUSUAL RASH, SWELLING OR PAIN  UNUSUAL VAGINAL DISCHARGE OR ITCHING   Items with * indicate a potential emergency and should be followed up as soon as possible or go to the Emergency Department if any problems should occur.  Please show the CHEMOTHERAPY ALERT CARD or IMMUNOTHERAPY ALERT CARD at check-in to the  Emergency Department and triage nurse.  Should you have questions after your visit or need to cancel or reschedule your appointment, please contact [facility]  Dept: [phone]  and follow the prompts.  Office hours are 8:00 a.m. to 4:30 p.m. Monday - Friday. Please note that voicemails left after 4:00 p.m. may not be returned until the following business day.  We are closed weekends and major holidays. You have access to a nurse at all times for urgent questions. Please call the main number to the clinic Dept: [phone] and follow the prompts.   For any non-urgent questions, you may also contact your provider using MyChart. We now offer e-Visits for anyone 18 and older to request care online for non-urgent symptoms. For details visit [email or web address].   Also download the MyChart app! Go to the app store, search "MyChart", open the app, select [facility], and log in with your MyChart username and password.  Masks are optional in the cancer centers. If you would like for your care team to wear a mask while they are taking care of you, please let them know. You may have one support person who is at least 70 years old accompany you for your appointments. 

## 2022-03-30 ENCOUNTER — Inpatient Hospital Stay: Payer: Medicare HMO

## 2022-03-30 VITALS — BP 113/70 | HR 80 | Temp 97.7°F | Resp 18

## 2022-03-30 DIAGNOSIS — R69 Illness, unspecified: Secondary | ICD-10-CM | POA: Diagnosis not present

## 2022-03-30 DIAGNOSIS — E785 Hyperlipidemia, unspecified: Secondary | ICD-10-CM | POA: Diagnosis not present

## 2022-03-30 DIAGNOSIS — E119 Type 2 diabetes mellitus without complications: Secondary | ICD-10-CM | POA: Diagnosis not present

## 2022-03-30 DIAGNOSIS — Z7952 Long term (current) use of systemic steroids: Secondary | ICD-10-CM | POA: Diagnosis not present

## 2022-03-30 DIAGNOSIS — C9310 Chronic myelomonocytic leukemia not having achieved remission: Secondary | ICD-10-CM

## 2022-03-30 DIAGNOSIS — D693 Immune thrombocytopenic purpura: Secondary | ICD-10-CM | POA: Diagnosis not present

## 2022-03-30 DIAGNOSIS — K5900 Constipation, unspecified: Secondary | ICD-10-CM | POA: Diagnosis not present

## 2022-03-30 DIAGNOSIS — Z5111 Encounter for antineoplastic chemotherapy: Secondary | ICD-10-CM | POA: Diagnosis not present

## 2022-03-30 DIAGNOSIS — I10 Essential (primary) hypertension: Secondary | ICD-10-CM | POA: Diagnosis not present

## 2022-03-30 DIAGNOSIS — Z7984 Long term (current) use of oral hypoglycemic drugs: Secondary | ICD-10-CM | POA: Diagnosis not present

## 2022-03-30 DIAGNOSIS — Z9884 Bariatric surgery status: Secondary | ICD-10-CM | POA: Diagnosis not present

## 2022-03-30 DIAGNOSIS — Z79899 Other long term (current) drug therapy: Secondary | ICD-10-CM | POA: Diagnosis not present

## 2022-03-30 MED ORDER — HEPARIN SOD (PORK) LOCK FLUSH 100 UNIT/ML IV SOLN
500.0000 [IU] | Freq: Once | INTRAVENOUS | Status: AC | PRN
  Administered 2022-03-30: 500 [IU]

## 2022-03-30 MED ORDER — SODIUM CHLORIDE 0.9% FLUSH
10.0000 mL | INTRAVENOUS | Status: DC | PRN
  Administered 2022-03-30: 10 mL

## 2022-03-30 MED ORDER — SODIUM CHLORIDE 0.9 % IV SOLN
10.0000 mg | Freq: Once | INTRAVENOUS | Status: AC
  Administered 2022-03-30: 10 mg via INTRAVENOUS
  Filled 2022-03-30: qty 10

## 2022-03-30 MED ORDER — SODIUM CHLORIDE 0.9 % IV SOLN: Freq: Once | INTRAVENOUS | Status: AC

## 2022-03-30 MED ORDER — SODIUM CHLORIDE 0.9 % IV SOLN
75.0000 mg/m2 | Freq: Once | INTRAVENOUS | Status: AC
  Administered 2022-03-30: 130 mg via INTRAVENOUS
  Filled 2022-03-30: qty 13

## 2022-03-30 MED FILL — Dexamethasone Sodium Phosphate Inj 100 MG/10ML: INTRAMUSCULAR | Qty: 1 | Status: AC

## 2022-03-30 NOTE — Patient Instructions (Signed)
[facility]COLOG[facility]  Discharge Instructions: Thank you for choosing [facility] to provide your oncology and hematology care.   If you have a lab appointment with the Cancer Center, please go directly to the Cancer Center and check in at the registration area.   Wear comfortable clothing and clothing appropriate for easy access to any Portacath or PICC line.   We strive to give you quality time with your provider. You may need to reschedule your appointment if you arrive late (15 or more minutes).  Arriving late affects you and other patients whose appointments are after yours.  Also, if you miss three or more appointments without notifying the office, you may be dismissed from the clinic at the provider's discretion.      For prescription refill requests, have your pharmacy contact our office and allow 72 hours for refills to be completed.    Today you received the following chemotherapy and/or immunotherapy agents: Vidaza      To help prevent nausea and vomiting after your treatment, we encourage you to take your nausea medication as directed.  BELOW ARE SYMPTOMS THAT SHOULD BE REPORTED IMMEDIATELY: *FEVER GREATER THAN 100.4 F (38 C) OR HIGHER *CHILLS OR SWEATING *NAUSEA AND VOMITING THAT IS NOT CONTROLLED WITH YOUR NAUSEA MEDICATION *UNUSUAL SHORTNESS OF BREATH *UNUSUAL BRUISING OR BLEEDING *URINARY PROBLEMS (pain or burning when urinating, or frequent urination) *BOWEL PROBLEMS (unusual diarrhea, constipation, pain near the anus) TENDERNESS IN MOUTH AND THROAT WITH OR WITHOUT PRESENCE OF ULCERS (sore throat, sores in mouth, or a toothache) UNUSUAL RASH, SWELLING OR PAIN  UNUSUAL VAGINAL DISCHARGE OR ITCHING   Items with * indicate a potential emergency and should be followed up as soon as possible or go to the Emergency Department if any problems should occur.  Please show the CHEMOTHERAPY ALERT CARD or IMMUNOTHERAPY ALERT CARD at check-in to the  Emergency Department and triage nurse.  Should you have questions after your visit or need to cancel or reschedule your appointment, please contact [facility]  Dept: [phone]  and follow the prompts.  Office hours are 8:00 a.m. to 4:30 p.m. Monday - Friday. Please note that voicemails left after 4:00 p.m. may not be returned until the following business day.  We are closed weekends and major holidays. You have access to a nurse at all times for urgent questions. Please call the main number to the clinic Dept: [phone] and follow the prompts.   For any non-urgent questions, you may also contact your provider using MyChart. We now offer e-Visits for anyone 18 and older to request care online for non-urgent symptoms. For details visit [email or web address].   Also download the MyChart app! Go to the app store, search "MyChart", open the app, select , and log in with your MyChart username and password.  Masks are optional in the cancer centers. If you would like for your care team to wear a mask while they are taking care of you, please let them know. You may have one support person who is at least 70 years old accompany you for your appointments. 

## 2022-03-31 ENCOUNTER — Inpatient Hospital Stay: Payer: Medicare HMO

## 2022-03-31 VITALS — BP 112/58 | HR 88 | Temp 98.0°F | Resp 18

## 2022-03-31 DIAGNOSIS — E119 Type 2 diabetes mellitus without complications: Secondary | ICD-10-CM | POA: Diagnosis not present

## 2022-03-31 DIAGNOSIS — Z7952 Long term (current) use of systemic steroids: Secondary | ICD-10-CM | POA: Diagnosis not present

## 2022-03-31 DIAGNOSIS — E785 Hyperlipidemia, unspecified: Secondary | ICD-10-CM | POA: Diagnosis not present

## 2022-03-31 DIAGNOSIS — D693 Immune thrombocytopenic purpura: Secondary | ICD-10-CM | POA: Diagnosis not present

## 2022-03-31 DIAGNOSIS — Z5111 Encounter for antineoplastic chemotherapy: Secondary | ICD-10-CM | POA: Diagnosis not present

## 2022-03-31 DIAGNOSIS — I10 Essential (primary) hypertension: Secondary | ICD-10-CM | POA: Diagnosis not present

## 2022-03-31 DIAGNOSIS — K5900 Constipation, unspecified: Secondary | ICD-10-CM | POA: Diagnosis not present

## 2022-03-31 DIAGNOSIS — C9310 Chronic myelomonocytic leukemia not having achieved remission: Secondary | ICD-10-CM | POA: Diagnosis not present

## 2022-03-31 DIAGNOSIS — Z9884 Bariatric surgery status: Secondary | ICD-10-CM | POA: Diagnosis not present

## 2022-03-31 DIAGNOSIS — Z7984 Long term (current) use of oral hypoglycemic drugs: Secondary | ICD-10-CM | POA: Diagnosis not present

## 2022-03-31 DIAGNOSIS — Z79899 Other long term (current) drug therapy: Secondary | ICD-10-CM | POA: Diagnosis not present

## 2022-03-31 DIAGNOSIS — R69 Illness, unspecified: Secondary | ICD-10-CM | POA: Diagnosis not present

## 2022-03-31 MED ORDER — SODIUM CHLORIDE 0.9 % IV SOLN
10.0000 mg | Freq: Once | INTRAVENOUS | Status: AC
  Administered 2022-03-31: 10 mg via INTRAVENOUS
  Filled 2022-03-31: qty 10

## 2022-03-31 MED ORDER — HEPARIN SOD (PORK) LOCK FLUSH 100 UNIT/ML IV SOLN
500.0000 [IU] | Freq: Once | INTRAVENOUS | Status: AC | PRN
  Administered 2022-03-31: 500 [IU]

## 2022-03-31 MED ORDER — SODIUM CHLORIDE 0.9 % IV SOLN: Freq: Once | INTRAVENOUS | Status: AC

## 2022-03-31 MED ORDER — SODIUM CHLORIDE 0.9% FLUSH
10.0000 mL | INTRAVENOUS | Status: DC | PRN
  Administered 2022-03-31: 10 mL

## 2022-03-31 MED ORDER — SODIUM CHLORIDE 0.9 % IV SOLN
75.0000 mg/m2 | Freq: Once | INTRAVENOUS | Status: AC
  Administered 2022-03-31: 130 mg via INTRAVENOUS
  Filled 2022-03-31: qty 13

## 2022-03-31 MED FILL — Dexamethasone Sodium Phosphate Inj 100 MG/10ML: INTRAMUSCULAR | Qty: 1 | Status: AC

## 2022-04-01 ENCOUNTER — Other Ambulatory Visit: Payer: Self-pay

## 2022-04-01 ENCOUNTER — Inpatient Hospital Stay: Payer: Medicare HMO

## 2022-04-01 VITALS — BP 119/60 | HR 90 | Temp 98.3°F | Resp 16

## 2022-04-01 DIAGNOSIS — C9310 Chronic myelomonocytic leukemia not having achieved remission: Secondary | ICD-10-CM | POA: Diagnosis not present

## 2022-04-01 DIAGNOSIS — Z5111 Encounter for antineoplastic chemotherapy: Secondary | ICD-10-CM | POA: Diagnosis not present

## 2022-04-01 DIAGNOSIS — D693 Immune thrombocytopenic purpura: Secondary | ICD-10-CM | POA: Diagnosis not present

## 2022-04-01 DIAGNOSIS — E785 Hyperlipidemia, unspecified: Secondary | ICD-10-CM | POA: Diagnosis not present

## 2022-04-01 DIAGNOSIS — K5900 Constipation, unspecified: Secondary | ICD-10-CM | POA: Diagnosis not present

## 2022-04-01 DIAGNOSIS — I10 Essential (primary) hypertension: Secondary | ICD-10-CM | POA: Diagnosis not present

## 2022-04-01 DIAGNOSIS — Z7952 Long term (current) use of systemic steroids: Secondary | ICD-10-CM | POA: Diagnosis not present

## 2022-04-01 DIAGNOSIS — R69 Illness, unspecified: Secondary | ICD-10-CM | POA: Diagnosis not present

## 2022-04-01 DIAGNOSIS — Z9884 Bariatric surgery status: Secondary | ICD-10-CM | POA: Diagnosis not present

## 2022-04-01 DIAGNOSIS — Z7984 Long term (current) use of oral hypoglycemic drugs: Secondary | ICD-10-CM | POA: Diagnosis not present

## 2022-04-01 DIAGNOSIS — Z79899 Other long term (current) drug therapy: Secondary | ICD-10-CM | POA: Diagnosis not present

## 2022-04-01 DIAGNOSIS — E119 Type 2 diabetes mellitus without complications: Secondary | ICD-10-CM | POA: Diagnosis not present

## 2022-04-01 MED ORDER — SODIUM CHLORIDE 0.9 % IV SOLN: Freq: Once | INTRAVENOUS | Status: AC

## 2022-04-01 MED ORDER — HEPARIN SOD (PORK) LOCK FLUSH 100 UNIT/ML IV SOLN
500.0000 [IU] | Freq: Once | INTRAVENOUS | Status: AC | PRN
  Administered 2022-04-01: 500 [IU]

## 2022-04-01 MED ORDER — SODIUM CHLORIDE 0.9 % IV SOLN
10.0000 mg | Freq: Once | INTRAVENOUS | Status: AC
  Administered 2022-04-01: 10 mg via INTRAVENOUS
  Filled 2022-04-01: qty 10

## 2022-04-01 MED ORDER — SODIUM CHLORIDE 0.9 % IV SOLN
75.0000 mg/m2 | Freq: Once | INTRAVENOUS | Status: AC
  Administered 2022-04-01: 130 mg via INTRAVENOUS
  Filled 2022-04-01: qty 13

## 2022-04-01 MED ORDER — SODIUM CHLORIDE 0.9% FLUSH
10.0000 mL | INTRAVENOUS | Status: DC | PRN
  Administered 2022-04-01: 10 mL

## 2022-04-01 NOTE — Patient Instructions (Signed)
[facility]COLOG[facility]  Discharge Instructions: Thank you for choosing [facility] to provide your oncology and hematology care.   If you have a lab appointment with the Cancer Center, please go directly to the Cancer Center and check in at the registration area.   Wear comfortable clothing and clothing appropriate for easy access to any Portacath or PICC line.   We strive to give you quality time with your provider. You may need to reschedule your appointment if you arrive late (15 or more minutes).  Arriving late affects you and other patients whose appointments are after yours.  Also, if you miss three or more appointments without notifying the office, you may be dismissed from the clinic at the provider's discretion.      For prescription refill requests, have your pharmacy contact our office and allow 72 hours for refills to be completed.    Today you received the following chemotherapy and/or immunotherapy agents: Vidaza      To help prevent nausea and vomiting after your treatment, we encourage you to take your nausea medication as directed.  BELOW ARE SYMPTOMS THAT SHOULD BE REPORTED IMMEDIATELY: *FEVER GREATER THAN 100.4 F (38 C) OR HIGHER *CHILLS OR SWEATING *NAUSEA AND VOMITING THAT IS NOT CONTROLLED WITH YOUR NAUSEA MEDICATION *UNUSUAL SHORTNESS OF BREATH *UNUSUAL BRUISING OR BLEEDING *URINARY PROBLEMS (pain or burning when urinating, or frequent urination) *BOWEL PROBLEMS (unusual diarrhea, constipation, pain near the anus) TENDERNESS IN MOUTH AND THROAT WITH OR WITHOUT PRESENCE OF ULCERS (sore throat, sores in mouth, or a toothache) UNUSUAL RASH, SWELLING OR PAIN  UNUSUAL VAGINAL DISCHARGE OR ITCHING   Items with * indicate a potential emergency and should be followed up as soon as possible or go to the Emergency Department if any problems should occur.  Please show the CHEMOTHERAPY ALERT CARD or IMMUNOTHERAPY ALERT CARD at check-in to the  Emergency Department and triage nurse.  Should you have questions after your visit or need to cancel or reschedule your appointment, please contact [facility]  Dept: [phone]  and follow the prompts.  Office hours are 8:00 a.m. to 4:30 p.m. Monday - Friday. Please note that voicemails left after 4:00 p.m. may not be returned until the following business day.  We are closed weekends and major holidays. You have access to a nurse at all times for urgent questions. Please call the main number to the clinic Dept: [phone] and follow the prompts.   For any non-urgent questions, you may also contact your provider using MyChart. We now offer e-Visits for anyone 18 and older to request care online for non-urgent symptoms. For details visit [email or web address].   Also download the MyChart app! Go to the app store, search "MyChart", open the app, select [place], and log in with your MyChart username and password.  Masks are optional in the cancer centers. If you would like for your care team to wear a mask while they are taking care of you, please let them know. You may have one support person who is at least 70 years old accompany you for your appointments. 

## 2022-04-04 ENCOUNTER — Ambulatory Visit (INDEPENDENT_AMBULATORY_CARE_PROVIDER_SITE_OTHER): Payer: Medicare HMO | Admitting: Family Medicine

## 2022-04-04 ENCOUNTER — Telehealth: Payer: Self-pay | Admitting: Family Medicine

## 2022-04-04 VITALS — BP 123/81 | HR 101 | Temp 98.1°F | Wt 144.6 lb

## 2022-04-04 DIAGNOSIS — C9310 Chronic myelomonocytic leukemia not having achieved remission: Secondary | ICD-10-CM | POA: Diagnosis not present

## 2022-04-04 DIAGNOSIS — D510 Vitamin B12 deficiency anemia due to intrinsic factor deficiency: Secondary | ICD-10-CM | POA: Diagnosis not present

## 2022-04-04 DIAGNOSIS — E538 Deficiency of other specified B group vitamins: Secondary | ICD-10-CM | POA: Diagnosis not present

## 2022-04-04 DIAGNOSIS — E1142 Type 2 diabetes mellitus with diabetic polyneuropathy: Secondary | ICD-10-CM | POA: Diagnosis not present

## 2022-04-04 DIAGNOSIS — S22000A Wedge compression fracture of unspecified thoracic vertebra, initial encounter for closed fracture: Secondary | ICD-10-CM | POA: Diagnosis not present

## 2022-04-04 DIAGNOSIS — G4700 Insomnia, unspecified: Secondary | ICD-10-CM | POA: Diagnosis not present

## 2022-04-04 DIAGNOSIS — D696 Thrombocytopenia, unspecified: Secondary | ICD-10-CM

## 2022-04-04 DIAGNOSIS — Z5320 Procedure and treatment not carried out because of patient's decision for unspecified reasons: Secondary | ICD-10-CM | POA: Diagnosis not present

## 2022-04-04 DIAGNOSIS — E782 Mixed hyperlipidemia: Secondary | ICD-10-CM | POA: Diagnosis not present

## 2022-04-04 DIAGNOSIS — I10 Essential (primary) hypertension: Secondary | ICD-10-CM | POA: Diagnosis not present

## 2022-04-04 LAB — POCT GLYCOSYLATED HEMOGLOBIN (HGB A1C)
HbA1c POC (<> result, manual entry): 7.4 % (ref 4.0–5.6)
HbA1c, POC (controlled diabetic range): 7.4 % — AB (ref 0.0–7.0)
HbA1c, POC (prediabetic range): 7.4 % — AB (ref 5.7–6.4)
Hemoglobin A1C: 7.4 % — AB (ref 4.0–5.6)

## 2022-04-04 LAB — MICROALBUMIN / CREATININE URINE RATIO
Creatinine,U: 57.8 mg/dL
Microalb Creat Ratio: 1.4 mg/g (ref 0.0–30.0)
Microalb, Ur: 0.8 mg/dL (ref 0.0–1.9)

## 2022-04-04 MED ORDER — OXYCODONE HCL 10 MG PO TABS: 10.0000 mg | ORAL_TABLET | Freq: Two times a day (BID) | ORAL | 0 refills | Status: DC | PRN

## 2022-04-04 MED ORDER — SITAGLIPTIN PHOS-METFORMIN HCL 50-1000 MG PO TABS: 1.0000 | ORAL_TABLET | Freq: Every day | ORAL | 1 refills | Status: DC

## 2022-04-04 MED ORDER — PREGABALIN 150 MG PO CAPS: 150.0000 mg | ORAL_CAPSULE | Freq: Two times a day (BID) | ORAL | 1 refills | Status: DC

## 2022-04-04 MED ORDER — METOPROLOL SUCCINATE ER 25 MG PO TB24: 25.0000 mg | ORAL_TABLET | Freq: Every day | ORAL | 1 refills | Status: DC

## 2022-04-04 NOTE — Telephone Encounter (Signed)
Spoke with patient regarding results/recommendations.  

## 2022-04-04 NOTE — Telephone Encounter (Signed)
Pt called and is needing clarification on which medication she needs to take especially the gabapentin. Please give patient a call to review with her.

## 2022-04-04 NOTE — Progress Notes (Signed)
Kathleen Gibson , Dec 25, 1950, 71 y.o., female MRN: [id number] Patient Care Team    Relationship Specialty Notifications Start End  [name], DO PCP - General Family Medicine  11/08/17   Doran Stabler, MD Consulting Physician Gastroenterology  04/06/17   Truitt Merle, MD Consulting Physician Hematology  04/06/17   Alda Berthold, [facility] Physician Neurology  01/16/19     Chief Complaint  Patient presents with   Diabetes    Pt is not fasting     Subjective:  Kathleen Gibson  is a 71 y.o. female presents for [place], CHRONIC/RLS Ambien being  placed on hold for now during use of Flexeril and chronic opioids.  She is not taking benzodiazepine prescribed by her neurologist which is helping some. She reports she still not sleeping well.  Symptoms are mostly related to restless leg pain.  Thrombocytopenia (HCC)/Chronic myelomonocytic leukemia not having achieved remission [facility]) Has routine follow-ups with her oncology team.  Intractable back pain/Compression fracture of body of thoracic vertebra [facility]) Patient continues to require opioid for discomfort, but not as frequently. She reports she definitely takes an oxycodone before bed just to try to get some comfort, but typically only is requiring before bed now..  She is taking the Lyrica and feels it is working well.  Type 2 diabetes mellitus with diabetic polyneuropathy, without long-term current use of insulin (HCC)/hyperlipidemia She reports compliance with Janumet 50-1000 mcg daily.. Patient denies dizziness, hyperglycemic or hypoglycemic events. Patient denies numbness, tingling in the extremities or nonhealing wounds of feet.   Hypertension: Patient reports compliance with metoprolol 25 mg daily. Patient denies chest pain, shortness of breath, dizziness or lower extremity edema.    No results for input(s): "HGB", "HCT", "WBC", "PLT" in the last 168 hours.    Latest Ref Rng & Units 03/28/2022   10:02 AM 03/21/2022     8:31 AM 03/14/2022    8:18 AM  CMP  Glucose 70 - 99 mg/dL 200  170  124   BUN 8 - 23 mg/dL $Remove'17  18  13   'peWgfoT$ Creatinine 0.44 - 1.00 mg/dL 0.73  0.71  0.66   Sodium 135 - 145 mmol/L 140  140  140   Potassium 3.5 - 5.1 mmol/L 4.0  4.1  4.0   Chloride 98 - 111 mmol/L 107  109  109   CO2 22 - 32 mmol/L $RemoveB'29  25  26   'ccVKzsea$ Calcium 8.9 - 10.3 mg/dL 8.6  8.4  8.3   Total Protein 6.5 - 8.1 g/dL  6.4  6.0   Total Bilirubin 0.3 - 1.2 mg/dL  0.9  0.9   Alkaline Phos 38 - 126 U/L  113  107   AST 15 - 41 U/L  19  21   ALT 0 - 44 U/L  19  22    No results found.     09/07/2021    9:16 AM 03/26/2021   11:29 AM 12/22/2020    3:34 PM 10/28/2020    9:13 AM 05/22/2020    8:02 AM  Depression screen PHQ 2/9  Decreased Interest 0 0 2 1 0  Down, Depressed, Hopeless 0 0 1 1 0  PHQ - 2 Score 0 0 3 2 0  Altered sleeping   3 0   Tired, decreased energy   1 3   Change in appetite   0 2   Feeling bad or failure about yourself    0  1   Trouble concentrating   1 2   Moving slowly or fidgety/restless   0 1   Suicidal thoughts   0 0   PHQ-9 Score   8 11     Allergies  Allergen Reactions   Asa [Aspirin] Other (See Comments)    Contraindicated d/t low plts   Nsaids Other (See Comments)    Contraindicated d/t low plts   Red Blood Cells Nausea Only and Other (See Comments)    Near end of transfusion pt developed nausea, facial flushing, and tachycardia. See progress note from 03/01/22   Social History   Tobacco Use   Smoking status: Never   Smokeless tobacco: Never  Substance Use Topics   Alcohol use: No   Past Medical History:  Diagnosis Date   Diabetes ([place])    HLD (hyperlipidemia)    Hypertension    Pernicious anemia    Pneumonia 09/05/2019   Renal insufficiency 09/06/2019   Vitamin D deficiency    Past Surgical History:  Procedure Laterality Date   ABDOMINAL HERNIA REPAIR  2009   ABDOMINOPLASTY     AXILLARY LYMPH NODE BIOPSY Left 11/11/2020   Procedure: LEFT AXILLARY EXCISIONAL LYMPH NODE  BIOPSY;  Surgeon: [name], MD;  Location: [place];  Service: General;  Laterality: Left;   [place]   GASTRIC BYPASS  2000   hemorroid surgery  2007   IR IMAGING GUIDED PORT INSERTION  07/26/2021   IR KYPHO EA ADDL LEVEL THORACIC OR LUMBAR  05/31/2021   IR KYPHO THORACIC WITH BONE BIOPSY  05/31/2021   TONSILLECTOMY AND ADENOIDECTOMY  1957   Family History  Problem Relation Age of Onset   Cancer Mother 13       unknown type cancer    Hypertension Mother    Hypertension Father    Heart failure Father    Pneumonia Father    Diabetes Sister    Leukemia Brother    Colon cancer Brother 60   Heart Problems Brother    Leukemia Other    Diabetes Sister    Heart Problems Sister    Cancer Brother    Heart Problems Brother    Allergies as of 04/04/2022       Reactions   Asa [aspirin] Other (See Comments)   Contraindicated d/t low plts   Nsaids Other (See Comments)   Contraindicated d/t low plts   Red Blood Cells Nausea Only, Other (See Comments)   Near end of transfusion pt developed nausea, facial flushing, and tachycardia. See progress note from 03/01/22        Medication List        Accurate as of April 04, 2022 10:49 AM. If you have any questions, ask your nurse or doctor.          cyanocobalamin 1000 MCG/ML injection Commonly known as: VITAMIN B12 Inject 1,000 mcg into the muscle every 30 (thirty) days.   cyclobenzaprine 5 MG tablet Commonly known as: FLEXERIL Take 1 tablet (5 mg total) by mouth 3 (three) times daily as needed for muscle spasms.   furosemide 20 MG tablet Commonly known as: LASIX TAKE 1 TABLET BY MOUTH EVERY DAY FOR UP TO 7 DAYS THEN AS NEEDED FOR LEG EDEMA   Klor-Con M10 10 MEQ tablet Generic drug: potassium chloride TAKE 1 TABLET BY MOUTH TWICE A DAY TAKE ALONG WITH FUROSEMIDE ONLY   lidocaine-prilocaine cream Commonly known as: EMLA APPLY 2 GRAMS TO PORT-A-CATH SITE 30-60 MINUTES PRIOR TO PORT  ACCESS AS NEEDED    metoprolol succinate 25 MG 24 hr tablet Commonly known as: TOPROL-XL Take 1 tablet (25 mg total) by mouth daily.   Narcan 4 MG/0.1ML Liqd nasal spray kit Generic drug: naloxone Place 1 spray into the nose once as needed (overdose).   ondansetron 4 MG disintegrating tablet Commonly known as: ZOFRAN-ODT Take 1 tablet (4 mg total) by mouth every 8 (eight) hours as needed for nausea or vomiting.   onetouch ultrasoft lancets Monitor blood sugars twice daily   OneTouch Verio test strip Generic drug: glucose blood Monitor blood sugars twice daily   Oxycodone HCl 10 MG Tabs Take 1 tablet (10 mg total) by mouth 2 (two) times daily as needed. 1 tab every 4-6 hr prn for chronic pain What changed:  how much to take how to take this when to take this reasons to take this   Oxycodone HCl 10 MG Tabs Take 1 tablet (10 mg total) by mouth 2 (two) times daily as needed. 1 tab every 4-6 hours as needed for pain What changed:  how much to take how to take this when to take this reasons to take this   Oxycodone HCl 10 MG Tabs Take 1 tablet (10 mg total) by mouth 2 (two) times daily as needed. What changed: You were already taking a medication with the same name, and this prescription was added. Make sure you understand how and when to take each.   polyethylene glycol powder 17 GM/SCOOP powder Commonly known as: GLYCOLAX/MIRALAX Dissolve 1 capful (17 g) in water and drink 2 (two) times daily. What changed: when to take this   pregabalin 150 MG capsule Commonly known as: Lyrica Take 1 capsule (150 mg total) by mouth 2 (two) times daily.   senna-docusate 8.6-50 MG tablet Commonly known as: Senokot-S Take 2 tablets by mouth at bedtime as needed for mild constipation.   sitaGLIPtin-metformin 50-1000 MG tablet Commonly known as: JANUMET Take 1 tablet by mouth at bedtime.        All past medical history, surgical history, allergies, family history, immunizations and medications were  updated in the EMR today and reviewed under the history and medication portions of their EMR.      ROS: Negative, with the exception of above mentioned in HPI   Objective:  BP 123/81   Pulse (!) 101   Temp 98.1 F (36.7 C)   Wt 144 lb 9.6 oz (65.6 kg)   SpO2 99%   BMI 25.61 kg/m  Body mass index is 25.61 kg/m. Physical Exam Vitals and nursing note reviewed.  Constitutional:      General: She is not in acute distress.    Appearance: Normal appearance. She is not ill-appearing, toxic-appearing or diaphoretic.  HENT:     Head: Normocephalic and atraumatic.  Eyes:     General: No scleral icterus.       Right eye: No discharge.        Left eye: No discharge.     Extraocular Movements: Extraocular movements intact.     Conjunctiva/sclera: Conjunctivae normal.     Pupils: Pupils are equal, round, and reactive to light.  Cardiovascular:     Rate and Rhythm: Normal rate and regular rhythm.  Pulmonary:     Effort: Pulmonary effort is normal. No respiratory distress.     Breath sounds: Normal breath sounds. No wheezing, rhonchi or rales.  Musculoskeletal:     Cervical back: Neck supple. No tenderness.     Right lower leg:  No edema.     Left lower leg: No edema.  Lymphadenopathy:     Cervical: No cervical adenopathy.  Skin:    General: Skin is warm and dry.     Coloration: Skin is not jaundiced or pale.     Findings: No erythema or rash.  Neurological:     Mental Status: She is alert and oriented to person, place, and time. Mental status is at baseline.     Motor: No weakness.     Gait: Gait normal.  Psychiatric:        Mood and Affect: Mood normal.        Behavior: Behavior normal.        Thought Content: Thought content normal.        Judgment: Judgment normal.     Diabetic Foot Exam - Simple   Simple Foot Form Diabetic Foot exam was performed with the following findings: Yes 04/04/2022 10:21 AM  Visual Inspection No deformities, no ulcerations, no other skin  breakdown bilaterally: Yes Sensation Testing Intact to touch and monofilament testing bilaterally: Yes Pulse Check Comments     Assessment/Plan: MARNI FRANZONI is a 71 y.o. female present for OV for [facility] ANEMIA, PERNICIOUS Continue B12 injections every 4 weeks indefinitely-Per oncology team has been completing these for her since she has been getting infusions as well for her leukemia. We will be happy to complete here once she is finished with infusions.  If she desires.  INSOMNIA, CHRONIC Ambien being  placed on hold for now during use of Ativan, muscle relaxers and opioids. She has been using ativan $RemoveBefo'1mg'sHPWZhDQGay$  qhs prescribed by onc. Strongly warned her of OD risk with benzo and opiate classes together.  She will try to decrease her ativan dose and will separate doses of the two meds.  Thrombocytopenia (HCC)/Chronic myelomonocytic leukemia not having achieved remission ([place]) Has routine follow-ups with her oncology team.  Intractable back pain/Compression fracture of body of thoracic vertebra ([place]) Stable and improving. Continue oxy twice daily as needed, routinely using before bed daily and only if needed during the day. Patient was encouraged to start Senokot 1-2 tabs nightly to avoid constipation with opiate use. Continue Lyrica 150 mg twice daily   Type 2 diabetes mellitus with diabetic polyneuropathy, without long-term current use of insulin (HCC)/hyperlipidemia Stable.   Continue Janumet 50-1000 mg daily - POCT HgB A1C>7.3 today> 7.4  Hypertension: Stable Encouraged her to hydrate. Continue metoprolol 25 mg daily.    Hereditary and idiopathic peripheral neuropathy Continue Lyrica  Return in about 24 weeks (around 09/19/2022) for Routine chronic condition follow-up.   Reviewed expectations re: course of current medical issues. Discussed self-management of symptoms. Outlined signs and symptoms indicating need for more acute intervention. Patient verbalized understanding and  all questions were answered. Patient received an After-Visit Summary. Any changes in medications were reviewed and patient was provided with updated med list with their AVS.     Orders Placed This Encounter  Procedures   Urine Microalbumin w/creat. ratio   POCT HgB A1C   Meds ordered this encounter  Medications   metoprolol succinate (TOPROL-XL) 25 MG 24 hr tablet    Sig: Take 1 tablet (25 mg total) by mouth daily.    Dispense:  90 tablet    Refill:  1   sitaGLIPtin-metformin (JANUMET) 50-1000 MG tablet    Sig: Take 1 tablet by mouth at bedtime.    Dispense:  90 tablet    Refill:  1   Oxycodone HCl  10 MG TABS    Sig: Take 1 tablet (10 mg total) by mouth 2 (two) times daily as needed. 1 tab every 4-6 hr prn for chronic pain    Dispense:  60 tablet    Refill:  0   Oxycodone HCl 10 MG TABS    Sig: Take 1 tablet (10 mg total) by mouth 2 (two) times daily as needed. 1 tab every 4-6 hours as needed for pain    Dispense:  60 tablet    Refill:  0    #2   pregabalin (LYRICA) 150 MG capsule    Sig: Take 1 capsule (150 mg total) by mouth 2 (two) times daily.    Dispense:  180 capsule    Refill:  1   Oxycodone HCl 10 MG TABS    Sig: Take 1 tablet (10 mg total) by mouth 2 (two) times daily as needed.    Dispense:  60 tablet    Refill:  0    #3    Note is dictated utilizing voice recognition software. Although note has been proof read prior to signing, occasional typographical errors still can be missed. If any questions arise, please do not hesitate to call for verification.   electronically signed by:  Howard Pouch, DO  [facility]

## 2022-04-04 NOTE — Patient Instructions (Signed)
Return in about 24 weeks (around 09/19/2022) for Routine chronic condition follow-up.        Great to see you today.  I have refilled the medication(s) we provide.   If labs were collected, we will inform you of lab results once received either by echart message or telephone call.   - echart message- for normal results that have been seen by the patient already.   - telephone call: abnormal results or if patient has not viewed results in their echart.

## 2022-04-05 ENCOUNTER — Other Ambulatory Visit: Payer: Self-pay

## 2022-04-05 ENCOUNTER — Inpatient Hospital Stay: Payer: Medicare HMO

## 2022-04-05 ENCOUNTER — Other Ambulatory Visit: Payer: Self-pay | Admitting: Hematology

## 2022-04-05 ENCOUNTER — Encounter: Payer: Self-pay | Admitting: Family Medicine

## 2022-04-05 VITALS — BP 122/78 | HR 88 | Temp 98.2°F | Resp 18

## 2022-04-05 DIAGNOSIS — D696 Thrombocytopenia, unspecified: Secondary | ICD-10-CM

## 2022-04-05 DIAGNOSIS — K5900 Constipation, unspecified: Secondary | ICD-10-CM | POA: Diagnosis not present

## 2022-04-05 DIAGNOSIS — Z7952 Long term (current) use of systemic steroids: Secondary | ICD-10-CM | POA: Diagnosis not present

## 2022-04-05 DIAGNOSIS — D693 Immune thrombocytopenic purpura: Secondary | ICD-10-CM | POA: Diagnosis not present

## 2022-04-05 DIAGNOSIS — I10 Essential (primary) hypertension: Secondary | ICD-10-CM | POA: Diagnosis not present

## 2022-04-05 DIAGNOSIS — E119 Type 2 diabetes mellitus without complications: Secondary | ICD-10-CM | POA: Diagnosis not present

## 2022-04-05 DIAGNOSIS — C9310 Chronic myelomonocytic leukemia not having achieved remission: Secondary | ICD-10-CM

## 2022-04-05 DIAGNOSIS — E785 Hyperlipidemia, unspecified: Secondary | ICD-10-CM | POA: Diagnosis not present

## 2022-04-05 DIAGNOSIS — Z7984 Long term (current) use of oral hypoglycemic drugs: Secondary | ICD-10-CM | POA: Diagnosis not present

## 2022-04-05 DIAGNOSIS — Z79899 Other long term (current) drug therapy: Secondary | ICD-10-CM | POA: Diagnosis not present

## 2022-04-05 DIAGNOSIS — R69 Illness, unspecified: Secondary | ICD-10-CM | POA: Diagnosis not present

## 2022-04-05 DIAGNOSIS — Z9884 Bariatric surgery status: Secondary | ICD-10-CM | POA: Diagnosis not present

## 2022-04-05 DIAGNOSIS — Z5111 Encounter for antineoplastic chemotherapy: Secondary | ICD-10-CM | POA: Diagnosis not present

## 2022-04-05 LAB — COMPREHENSIVE METABOLIC PANEL
ALT: 25 U/L (ref 0–44)
AST: 20 U/L (ref 15–41)
Albumin: 3.4 g/dL — ABNORMAL LOW (ref 3.5–5.0)
Alkaline Phosphatase: 113 U/L (ref 38–126)
Anion gap: 5 (ref 5–15)
BUN: 16 mg/dL (ref 8–23)
CO2: 26 mmol/L (ref 22–32)
Calcium: 8.6 mg/dL — ABNORMAL LOW (ref 8.9–10.3)
Chloride: 106 mmol/L (ref 98–111)
Creatinine, Ser: 0.64 mg/dL (ref 0.44–1.00)
GFR, Estimated: >60 mL/min (ref >60)
Glucose, Bld: 168 mg/dL — ABNORMAL HIGH (ref 70–99)
Potassium: 4.4 mmol/L (ref 3.5–5.1)
Sodium: 137 mmol/L (ref 135–145)
Total Bilirubin: 1.1 mg/dL (ref 0.3–1.2)
Total Protein: 6.7 g/dL (ref 6.5–8.1)

## 2022-04-05 LAB — CBC WITH DIFFERENTIAL (CANCER CENTER ONLY)
Abs Immature Granulocytes: 0.4 10*3/uL — ABNORMAL HIGH (ref 0.00–0.07)
Basophils Absolute: 0.1 10*3/uL (ref 0.0–0.1)
Basophils Relative: 1 %
Eosinophils Absolute: 0.1 10*3/uL (ref 0.0–0.5)
Eosinophils Relative: 2 %
HCT: 29.7 % — ABNORMAL LOW (ref 36.0–46.0)
Hemoglobin: 9.6 g/dL — ABNORMAL LOW (ref 12.0–15.0)
Immature Granulocytes: 5 %
Lymphocytes Relative: 9 %
Lymphs Abs: 0.7 10*3/uL (ref 0.7–4.0)
MCH: 26.1 pg (ref 26.0–34.0)
MCHC: 32.3 g/dL (ref 30.0–36.0)
MCV: 80.7 fL (ref 80.0–100.0)
Monocytes Absolute: 0.9 10*3/uL (ref 0.1–1.0)
Monocytes Relative: 12 %
Neutro Abs: 5.4 10*3/uL (ref 1.7–7.7)
Neutrophils Relative %: 71 %
Platelet Count: 58 10*3/uL — ABNORMAL LOW (ref 150–400)
RBC: 3.68 MIL/uL — ABNORMAL LOW (ref 3.87–5.11)
RDW: 23.8 % — ABNORMAL HIGH (ref 11.5–15.5)
WBC Count: 7.5 10*3/uL (ref 4.0–10.5)
nRBC: 0 % (ref 0.0–0.2)

## 2022-04-05 LAB — SAMPLE TO BLOOD BANK

## 2022-04-05 MED ORDER — ROMIPLOSTIM INJECTION 500 MCG: 5.0000 ug/kg | Freq: Once | SUBCUTANEOUS | Status: DC

## 2022-04-11 ENCOUNTER — Inpatient Hospital Stay: Payer: Medicare HMO

## 2022-04-11 ENCOUNTER — Other Ambulatory Visit: Payer: Self-pay

## 2022-04-11 VITALS — BP 112/61 | HR 90 | Resp 18

## 2022-04-11 DIAGNOSIS — R69 Illness, unspecified: Secondary | ICD-10-CM | POA: Diagnosis not present

## 2022-04-11 DIAGNOSIS — E785 Hyperlipidemia, unspecified: Secondary | ICD-10-CM | POA: Diagnosis not present

## 2022-04-11 DIAGNOSIS — C9310 Chronic myelomonocytic leukemia not having achieved remission: Secondary | ICD-10-CM | POA: Diagnosis not present

## 2022-04-11 DIAGNOSIS — D693 Immune thrombocytopenic purpura: Secondary | ICD-10-CM | POA: Diagnosis not present

## 2022-04-11 DIAGNOSIS — Z7984 Long term (current) use of oral hypoglycemic drugs: Secondary | ICD-10-CM | POA: Diagnosis not present

## 2022-04-11 DIAGNOSIS — Z5111 Encounter for antineoplastic chemotherapy: Secondary | ICD-10-CM | POA: Diagnosis not present

## 2022-04-11 DIAGNOSIS — Z7952 Long term (current) use of systemic steroids: Secondary | ICD-10-CM | POA: Diagnosis not present

## 2022-04-11 DIAGNOSIS — I10 Essential (primary) hypertension: Secondary | ICD-10-CM | POA: Diagnosis not present

## 2022-04-11 DIAGNOSIS — D696 Thrombocytopenia, unspecified: Secondary | ICD-10-CM

## 2022-04-11 DIAGNOSIS — Z9884 Bariatric surgery status: Secondary | ICD-10-CM | POA: Diagnosis not present

## 2022-04-11 DIAGNOSIS — K5900 Constipation, unspecified: Secondary | ICD-10-CM | POA: Diagnosis not present

## 2022-04-11 DIAGNOSIS — Z79899 Other long term (current) drug therapy: Secondary | ICD-10-CM | POA: Diagnosis not present

## 2022-04-11 DIAGNOSIS — Z95828 Presence of other vascular implants and grafts: Secondary | ICD-10-CM

## 2022-04-11 DIAGNOSIS — E119 Type 2 diabetes mellitus without complications: Secondary | ICD-10-CM | POA: Diagnosis not present

## 2022-04-11 LAB — CBC WITH DIFFERENTIAL (CANCER CENTER ONLY)
Abs Immature Granulocytes: 0.43 10*3/uL — ABNORMAL HIGH (ref 0.00–0.07)
Basophils Absolute: 0.1 10*3/uL (ref 0.0–0.1)
Basophils Relative: 1 %
Eosinophils Absolute: 0.1 10*3/uL (ref 0.0–0.5)
Eosinophils Relative: 1 %
HCT: 30.7 % — ABNORMAL LOW (ref 36.0–46.0)
Hemoglobin: 9.7 g/dL — ABNORMAL LOW (ref 12.0–15.0)
Immature Granulocytes: 5 %
Lymphocytes Relative: 8 %
Lymphs Abs: 0.7 10*3/uL (ref 0.7–4.0)
MCH: 26.1 pg (ref 26.0–34.0)
MCHC: 31.6 g/dL (ref 30.0–36.0)
MCV: 82.5 fL (ref 80.0–100.0)
Monocytes Absolute: 1.5 10*3/uL — ABNORMAL HIGH (ref 0.1–1.0)
Monocytes Relative: 18 %
Neutro Abs: 5.7 10*3/uL (ref 1.7–7.7)
Neutrophils Relative %: 67 %
Platelet Count: 30 10*3/uL — ABNORMAL LOW (ref 150–400)
RBC: 3.72 MIL/uL — ABNORMAL LOW (ref 3.87–5.11)
RDW: 25.1 % — ABNORMAL HIGH (ref 11.5–15.5)
WBC Count: 8.5 10*3/uL (ref 4.0–10.5)
nRBC: 0 % (ref 0.0–0.2)

## 2022-04-11 LAB — SAMPLE TO BLOOD BANK

## 2022-04-11 MED ORDER — HEPARIN SOD (PORK) LOCK FLUSH 100 UNIT/ML IV SOLN
500.0000 [IU] | Freq: Once | INTRAVENOUS | Status: AC
  Administered 2022-04-11: 500 [IU]

## 2022-04-11 MED ORDER — CYANOCOBALAMIN 1000 MCG/ML IJ SOLN
1000.0000 ug | Freq: Once | INTRAMUSCULAR | Status: AC
  Administered 2022-04-11: 1000 ug via INTRAMUSCULAR
  Filled 2022-04-11: qty 1

## 2022-04-11 MED ORDER — SODIUM CHLORIDE 0.9% FLUSH
10.0000 mL | Freq: Once | INTRAVENOUS | Status: AC
  Administered 2022-04-11: 10 mL

## 2022-04-11 MED ORDER — ROMIPLOSTIM INJECTION 500 MCG
6.0000 ug/kg | Freq: Once | SUBCUTANEOUS | Status: AC
  Administered 2022-04-11: 395 ug via SUBCUTANEOUS
  Filled 2022-04-11: qty 0.79

## 2022-04-11 NOTE — Patient Instructions (Signed)
Romiplostim Injection What is this medication? ROMIPLOSTIM (roe mi PLOE stim) treats low levels of platelets in your body caused by immune thrombocytopenia (ITP). It is prescribed when other medications have not worked or cannot be tolerated. It may also be used to help people who have been exposed to high doses of radiation. It works by increasing the amount of platelets in your blood. This lowers the risk of bleeding. This medicine may be used for other purposes; ask your health care provider or pharmacist if you have questions. COMMON BRAND NAME(S): Nplate What should I tell my care team before I take this medication? They need to know if you have any of these conditions: Blood clots Myelodysplastic syndrome An unusual or allergic reaction to romiplostim, mannitol, other medications, foods, dyes, or preservatives Pregnant or trying to get pregnant Breast-feeding How should I use this medication? This medication is injected under the skin. It is given by a care team in a hospital or clinic setting. A special MedGuide will be given to you before each treatment. Be sure to read this information carefully each time. Talk to your care team about the use of this medication in children. While it may be prescribed for children as young as newborns for selected conditions, precautions do apply. Overdosage: If you think you have taken too much of this medicine contact a poison control center or emergency room at once. NOTE: This medicine is only for you. Do not share this medicine with others. What if I miss a dose? Keep appointments for follow-up doses. It is important not to miss your dose. Call your care team if you are unable to keep an appointment. What may interact with this medication? Interactions are not expected. This list may not describe all possible interactions. Give your health care provider a list of all the medicines, herbs, non-prescription drugs, or dietary supplements you use. Also  tell them if you smoke, drink alcohol, or use illegal drugs. Some items may interact with your medicine. What should I watch for while using this medication? Visit your care team for regular checks on your progress. You may need blood work done while you are taking this medication. Your condition will be monitored carefully while you are receiving this medication. It is important not to miss any appointments. What side effects may I notice from receiving this medication? Side effects that you should report to your care team as soon as possible: Allergic reactions--skin rash, itching, hives, swelling of the face, lips, tongue, or throat Blood clot--pain, swelling, or warmth in the leg, shortness of breath, chest pain Side effects that usually do not require medical attention (report to your care team if they continue or are bothersome): Dizziness Joint pain Muscle pain Pain in the hands or feet Stomach pain Trouble sleeping This list may not describe all possible side effects. Call your doctor for medical advice about side effects. You may report side effects to FDA at 1-800-FDA-1088. Where should I keep my medication? This medication is given in a hospital or clinic. It will not be stored at home. NOTE: This sheet is a summary. It may not cover all possible information. If you have questions about this medicine, talk to your doctor, pharmacist, or health care provider.  2023 Elsevier/Gold Standard (2021-09-07 00:00:00)  

## 2022-04-13 ENCOUNTER — Encounter: Payer: Self-pay | Admitting: Hematology

## 2022-04-13 LAB — HM DIABETES EYE EXAM

## 2022-04-13 NOTE — Telephone Encounter (Signed)
Patient called to report that she has had pain in her left where her hip meets her leg. No erythema or edema in the area. Patient states that the pain goes away when she is sitting or lying. Patient agreed to reach out to her PCP if this continues.

## 2022-04-14 ENCOUNTER — Encounter: Payer: Self-pay | Admitting: Family Medicine

## 2022-04-18 ENCOUNTER — Inpatient Hospital Stay: Payer: Medicare HMO

## 2022-04-18 ENCOUNTER — Inpatient Hospital Stay: Payer: Medicare HMO | Attending: Hematology

## 2022-04-18 ENCOUNTER — Other Ambulatory Visit: Payer: Self-pay

## 2022-04-18 ENCOUNTER — Other Ambulatory Visit: Payer: Self-pay | Admitting: Hematology

## 2022-04-18 VITALS — BP 110/65 | HR 85 | Temp 98.9°F | Resp 16

## 2022-04-18 DIAGNOSIS — D693 Immune thrombocytopenic purpura: Secondary | ICD-10-CM | POA: Diagnosis not present

## 2022-04-18 DIAGNOSIS — D510 Vitamin B12 deficiency anemia due to intrinsic factor deficiency: Secondary | ICD-10-CM | POA: Diagnosis not present

## 2022-04-18 DIAGNOSIS — Z5111 Encounter for antineoplastic chemotherapy: Secondary | ICD-10-CM | POA: Insufficient documentation

## 2022-04-18 DIAGNOSIS — Z95828 Presence of other vascular implants and grafts: Secondary | ICD-10-CM

## 2022-04-18 DIAGNOSIS — C9310 Chronic myelomonocytic leukemia not having achieved remission: Secondary | ICD-10-CM | POA: Diagnosis not present

## 2022-04-18 DIAGNOSIS — Z7984 Long term (current) use of oral hypoglycemic drugs: Secondary | ICD-10-CM | POA: Insufficient documentation

## 2022-04-18 DIAGNOSIS — Z9884 Bariatric surgery status: Secondary | ICD-10-CM | POA: Diagnosis not present

## 2022-04-18 DIAGNOSIS — Z7952 Long term (current) use of systemic steroids: Secondary | ICD-10-CM | POA: Insufficient documentation

## 2022-04-18 DIAGNOSIS — I10 Essential (primary) hypertension: Secondary | ICD-10-CM | POA: Insufficient documentation

## 2022-04-18 DIAGNOSIS — Z79899 Other long term (current) drug therapy: Secondary | ICD-10-CM | POA: Diagnosis not present

## 2022-04-18 DIAGNOSIS — E119 Type 2 diabetes mellitus without complications: Secondary | ICD-10-CM | POA: Insufficient documentation

## 2022-04-18 DIAGNOSIS — D696 Thrombocytopenia, unspecified: Secondary | ICD-10-CM

## 2022-04-18 LAB — BASIC METABOLIC PANEL - CANCER CENTER ONLY
Anion gap: 7 (ref 5–15)
BUN: 16 mg/dL (ref 8–23)
CO2: 26 mmol/L (ref 22–32)
Calcium: 8.4 mg/dL — ABNORMAL LOW (ref 8.9–10.3)
Chloride: 106 mmol/L (ref 98–111)
Creatinine: 0.7 mg/dL (ref 0.44–1.00)
GFR, Estimated: >60 mL/min (ref >60)
Glucose, Bld: 225 mg/dL — ABNORMAL HIGH (ref 70–99)
Potassium: 3.8 mmol/L (ref 3.5–5.1)
Sodium: 139 mmol/L (ref 135–145)

## 2022-04-18 LAB — CBC WITH DIFFERENTIAL (CANCER CENTER ONLY)
Abs Immature Granulocytes: 0.02 10*3/uL (ref 0.00–0.07)
Basophils Absolute: 0 10*3/uL (ref 0.0–0.1)
Basophils Relative: 0 %
Eosinophils Absolute: 0 10*3/uL (ref 0.0–0.5)
Eosinophils Relative: 1 %
HCT: 26 % — ABNORMAL LOW (ref 36.0–46.0)
Hemoglobin: 8.2 g/dL — ABNORMAL LOW (ref 12.0–15.0)
Immature Granulocytes: 0 %
Lymphocytes Relative: 9 %
Lymphs Abs: 0.4 10*3/uL — ABNORMAL LOW (ref 0.7–4.0)
MCH: 26.4 pg (ref 26.0–34.0)
MCHC: 31.5 g/dL (ref 30.0–36.0)
MCV: 83.6 fL (ref 80.0–100.0)
Monocytes Absolute: 1 10*3/uL (ref 0.1–1.0)
Monocytes Relative: 21 %
Neutro Abs: 3.5 10*3/uL (ref 1.7–7.7)
Neutrophils Relative %: 69 %
Platelet Count: 38 10*3/uL — ABNORMAL LOW (ref 150–400)
RBC: 3.11 MIL/uL — ABNORMAL LOW (ref 3.87–5.11)
RDW: 25.3 % — ABNORMAL HIGH (ref 11.5–15.5)
WBC Count: 5 10*3/uL (ref 4.0–10.5)
nRBC: 0 % (ref 0.0–0.2)

## 2022-04-18 LAB — SAMPLE TO BLOOD BANK

## 2022-04-18 MED ORDER — ROMIPLOSTIM INJECTION 500 MCG
7.0000 ug/kg | Freq: Once | SUBCUTANEOUS | Status: AC
  Administered 2022-04-18: 460 ug via SUBCUTANEOUS
  Filled 2022-04-18: qty 0.92

## 2022-04-18 MED ORDER — SODIUM CHLORIDE 0.9% FLUSH
10.0000 mL | Freq: Once | INTRAVENOUS | Status: AC
  Administered 2022-04-18: 10 mL

## 2022-04-18 MED ORDER — HEPARIN SOD (PORK) LOCK FLUSH 100 UNIT/ML IV SOLN
250.0000 [IU] | Freq: Once | INTRAVENOUS | Status: AC
  Administered 2022-04-18: 250 [IU]

## 2022-04-22 MED FILL — Dexamethasone Sodium Phosphate Inj 100 MG/10ML: INTRAMUSCULAR | Qty: 1 | Status: AC

## 2022-04-25 ENCOUNTER — Inpatient Hospital Stay: Payer: Medicare HMO

## 2022-04-25 ENCOUNTER — Encounter: Payer: Self-pay | Admitting: Hematology

## 2022-04-25 ENCOUNTER — Inpatient Hospital Stay (HOSPITAL_BASED_OUTPATIENT_CLINIC_OR_DEPARTMENT_OTHER): Payer: Medicare HMO | Admitting: Hematology

## 2022-04-25 ENCOUNTER — Other Ambulatory Visit: Payer: Self-pay

## 2022-04-25 VITALS — BP 121/64 | HR 89 | Temp 97.6°F | Resp 16 | Ht 63.0 in | Wt 151.1 lb

## 2022-04-25 DIAGNOSIS — Z95828 Presence of other vascular implants and grafts: Secondary | ICD-10-CM

## 2022-04-25 DIAGNOSIS — C9310 Chronic myelomonocytic leukemia not having achieved remission: Secondary | ICD-10-CM

## 2022-04-25 DIAGNOSIS — E119 Type 2 diabetes mellitus without complications: Secondary | ICD-10-CM | POA: Diagnosis not present

## 2022-04-25 DIAGNOSIS — Z5111 Encounter for antineoplastic chemotherapy: Secondary | ICD-10-CM | POA: Diagnosis not present

## 2022-04-25 DIAGNOSIS — I10 Essential (primary) hypertension: Secondary | ICD-10-CM | POA: Diagnosis not present

## 2022-04-25 DIAGNOSIS — D510 Vitamin B12 deficiency anemia due to intrinsic factor deficiency: Secondary | ICD-10-CM | POA: Diagnosis not present

## 2022-04-25 DIAGNOSIS — Z7952 Long term (current) use of systemic steroids: Secondary | ICD-10-CM | POA: Diagnosis not present

## 2022-04-25 DIAGNOSIS — Z9884 Bariatric surgery status: Secondary | ICD-10-CM | POA: Diagnosis not present

## 2022-04-25 DIAGNOSIS — D696 Thrombocytopenia, unspecified: Secondary | ICD-10-CM

## 2022-04-25 DIAGNOSIS — D693 Immune thrombocytopenic purpura: Secondary | ICD-10-CM | POA: Diagnosis not present

## 2022-04-25 DIAGNOSIS — Z79899 Other long term (current) drug therapy: Secondary | ICD-10-CM | POA: Diagnosis not present

## 2022-04-25 DIAGNOSIS — Z7984 Long term (current) use of oral hypoglycemic drugs: Secondary | ICD-10-CM | POA: Diagnosis not present

## 2022-04-25 LAB — CBC WITH DIFFERENTIAL (CANCER CENTER ONLY)
Abs Immature Granulocytes: 0.01 10*3/uL (ref 0.00–0.07)
Basophils Absolute: 0.1 10*3/uL (ref 0.0–0.1)
Basophils Relative: 1 %
Eosinophils Absolute: 0 10*3/uL (ref 0.0–0.5)
Eosinophils Relative: 1 %
HCT: 26.6 % — ABNORMAL LOW (ref 36.0–46.0)
Hemoglobin: 8.3 g/dL — ABNORMAL LOW (ref 12.0–15.0)
Immature Granulocytes: 0 %
Lymphocytes Relative: 13 %
Lymphs Abs: 0.5 10*3/uL — ABNORMAL LOW (ref 0.7–4.0)
MCH: 25.9 pg — ABNORMAL LOW (ref 26.0–34.0)
MCHC: 31.2 g/dL (ref 30.0–36.0)
MCV: 82.9 fL (ref 80.0–100.0)
Monocytes Absolute: 0.9 10*3/uL (ref 0.1–1.0)
Monocytes Relative: 26 %
Neutro Abs: 2.1 10*3/uL (ref 1.7–7.7)
Neutrophils Relative %: 59 %
Platelet Count: 78 10*3/uL — ABNORMAL LOW (ref 150–400)
RBC: 3.21 MIL/uL — ABNORMAL LOW (ref 3.87–5.11)
RDW: 23.9 % — ABNORMAL HIGH (ref 11.5–15.5)
WBC Count: 3.6 10*3/uL — ABNORMAL LOW (ref 4.0–10.5)
nRBC: 0 % (ref 0.0–0.2)

## 2022-04-25 LAB — COMPREHENSIVE METABOLIC PANEL
ALT: 18 U/L (ref 0–44)
AST: 19 U/L (ref 15–41)
Albumin: 3.6 g/dL (ref 3.5–5.0)
Alkaline Phosphatase: 116 U/L (ref 38–126)
Anion gap: 5 (ref 5–15)
BUN: 12 mg/dL (ref 8–23)
CO2: 27 mmol/L (ref 22–32)
Calcium: 8.4 mg/dL — ABNORMAL LOW (ref 8.9–10.3)
Chloride: 107 mmol/L (ref 98–111)
Creatinine, Ser: 0.59 mg/dL (ref 0.44–1.00)
GFR, Estimated: >60 mL/min (ref >60)
Glucose, Bld: 126 mg/dL — ABNORMAL HIGH (ref 70–99)
Potassium: 4.2 mmol/L (ref 3.5–5.1)
Sodium: 139 mmol/L (ref 135–145)
Total Bilirubin: 1.1 mg/dL (ref 0.3–1.2)
Total Protein: 6.9 g/dL (ref 6.5–8.1)

## 2022-04-25 LAB — SAMPLE TO BLOOD BANK

## 2022-04-25 MED ORDER — ROMIPLOSTIM INJECTION 500 MCG
7.0000 ug/kg | Freq: Once | SUBCUTANEOUS | Status: AC
  Administered 2022-04-25: 480 ug via SUBCUTANEOUS
  Filled 2022-04-25: qty 0.96

## 2022-04-25 MED ORDER — SODIUM CHLORIDE 0.9 % IV SOLN
75.0000 mg/m2 | Freq: Once | INTRAVENOUS | Status: AC
  Administered 2022-04-25: 130 mg via INTRAVENOUS
  Filled 2022-04-25: qty 13

## 2022-04-25 MED ORDER — SODIUM CHLORIDE 0.9 % IV SOLN: Freq: Once | INTRAVENOUS | Status: AC

## 2022-04-25 MED ORDER — SODIUM CHLORIDE 0.9% FLUSH
10.0000 mL | Freq: Once | INTRAVENOUS | Status: AC
  Administered 2022-04-25: 10 mL

## 2022-04-25 MED ORDER — SODIUM CHLORIDE 0.9 % IV SOLN
10.0000 mg | Freq: Once | INTRAVENOUS | Status: AC
  Administered 2022-04-25: 10 mg via INTRAVENOUS
  Filled 2022-04-25: qty 10

## 2022-04-25 MED FILL — Dexamethasone Sodium Phosphate Inj 100 MG/10ML: INTRAMUSCULAR | Qty: 1 | Status: AC

## 2022-04-25 NOTE — Progress Notes (Signed)
Orders for Nplate, labs, and B12 injection faxed to [facility] with [facility] [phone]).  Fax confirmation received.

## 2022-04-25 NOTE — Patient Instructions (Signed)
[facility] MEDICAL [facility]COLOGY  Discharge Instructions: Thank you for choosing [facility] to provide your oncology and hematology care.   If you have a lab appointment with the Cancer Center, please go directly to the Cancer Center and check in at the registration area.   Wear comfortable clothing and clothing appropriate for easy access to any Portacath or PICC line.   We strive to give you quality time with your provider. You may need to reschedule your appointment if you arrive late (15 or more minutes).  Arriving late affects you and other patients whose appointments are after yours.  Also, if you miss three or more appointments without notifying the office, you may be dismissed from the clinic at the provider's discretion.      For prescription refill requests, have your pharmacy contact our office and allow 72 hours for refills to be completed.    Today you received the following chemotherapy and/or immunotherapy agents: Vidaza      To help prevent nausea and vomiting after your treatment, we encourage you to take your nausea medication as directed.  BELOW ARE SYMPTOMS THAT SHOULD BE REPORTED IMMEDIATELY: *FEVER GREATER THAN 100.4 F (38 C) OR HIGHER *CHILLS OR SWEATING *NAUSEA AND VOMITING THAT IS NOT CONTROLLED WITH YOUR NAUSEA MEDICATION *UNUSUAL SHORTNESS OF BREATH *UNUSUAL BRUISING OR BLEEDING *URINARY PROBLEMS (pain or burning when urinating, or frequent urination) *BOWEL PROBLEMS (unusual diarrhea, constipation, pain near the anus) TENDERNESS IN MOUTH AND THROAT WITH OR WITHOUT PRESENCE OF ULCERS (sore throat, sores in mouth, or a toothache) UNUSUAL RASH, SWELLING OR PAIN  UNUSUAL VAGINAL DISCHARGE OR ITCHING   Items with * indicate a potential emergency and should be followed up as soon as possible or go to the Emergency Department if any problems should occur.  Please show the CHEMOTHERAPY ALERT CARD or IMMUNOTHERAPY ALERT CARD at check-in to the  Emergency Department and triage nurse.  Should you have questions after your visit or need to cancel or reschedule your appointment, please contact [facility]  Dept: [phone]  and follow the prompts.  Office hours are 8:00 a.m. to 4:30 p.m. Monday - Friday. Please note that voicemails left after 4:00 p.m. may not be returned until the following business day.  We are closed weekends and major holidays. You have access to a nurse at all times for urgent questions. Please call the main number to the clinic Dept: [phone] and follow the prompts.   For any non-urgent questions, you may also contact your provider using MyChart. We now offer e-Visits for anyone 18 and older to request care online for non-urgent symptoms. For details visit [email or web address].   Also download the MyChart app! Go to the app store, search "MyChart", open the app, select [place], and log in with your MyChart username and password.  Masks are optional in the cancer centers. If you would like for your care team to wear a mask while they are taking care of you, please let them know. You may have one support person who is at least 70 years old accompany you for your appointments. 

## 2022-04-25 NOTE — Progress Notes (Signed)
[place]   Telephone:([phone]) [phone] Fax:([phone]) [phone]   Clinic Follow up Note   Patient Care Team: [name], DO as PCP - General (Family Medicine) Loletha Carrow, Kirke Corin, MD as Consulting Physician (Gastroenterology) Truitt Merle, MD as Consulting Physician (Hematology) Alda Berthold, DO as Consulting Physician (Neurology)  Date of Service:  04/25/2022  CHIEF COMPLAINT: f/u of [name], severe thrombocytopenia   CURRENT THERAPY:  Azacitidine days 1-5 q28 days, started 12/28/20 Nplate 5mg/kg weekly Platelet transfusion as needed (plt<15K), blood transfusion if Hg<8.0  ASSESSMENT & PLAN:  Kathleen BASKINSis a 71y.o. female with   1. [name] with severe thrombocytopenia  -Initially diagnosed with ITP in 2014. She lost follow up after 11/2017 and did not proceed with recommended bone marrow biopsy. -She was referred to ED 10/28/20 by her PCP for low plt count of 4k. She was hospitalized and treated with platelet transfusions, IVIG, oral prednisone 646mand Nplate injections. She tapered off prednisone given little response. -Bone marrow biopsy 10/30/20 felt to likely represent MDS/MPN, particularly [name].  [facility] PaLinus OrnShe began treatment for severe refractory thrombocytopenia with weekly Rituxan x4 on 11/19/20, but she did not respond  -She began azacitidine daily days 1-5 q. 28 days on 12/28/20 -She had worsening thrombocytopenia after stopping Nplate, restarted weekly Nplate on 12/15/46/18Bone marrow biopsy 03/18/21 showed stable disease, no increase in blasts. -labs reviewed, plt 78k, Hg 8.3 today. She will proceed with Vidaza today and daily for the rest of this week, and continue weekly Nplate. Will increase dose back to 1075mkg    2. Normocytic anemia, moderate  -H/o pernicious anemia/vitamin B12 deficiency -Transfuse for Hgb </= 7.5 -Continue oral iron and oral B12. -previously on B12 injections due to prior gastric  bypass surgery, last 12/06/21. -her hgb is 8.3 today (04/25/22).    3. Thoracic spine compression fractures -T10 and T12 kyphoplasty performed 05/31/21 -she continues on oxycodone, managed by her PCP -will hold on Zometa for now -I previously advised her to restart calcium and vit D.     PLAN: -proceed with Nplate (increase dose back to 75m55mg) and azacitidine today. -continue azacitidine daily for the rest of this week as scheduled  -continue Nplate weekly -next cycle Vidaza infusion daily X5, starting in 4 weeks -F/u in 4 weeks   No problem-specific Assessment & Plan notes found for this encounter.   SUMMARY OF ONCOLOGIC HISTORY: Oncology History  [name] (chronic myelomonocytic leukemia) (HCC)[place]/19/2022 Imaging   CT CAP  IMPRESSION: 1. Splenomegaly with pathologically enlarged lymph nodes above and below the diaphragm, with overall stable to minimally increased abdominal adenopathy and interval progression of the pelvic adenopathy. 2. Small volume abdominopelvic ascites with diffuse mesenteric stranding. 3. Scattered bilateral pulmonary micro nodules measuring 1-2 mm. 4. Distended gallbladder with some layering hyperdense material representing layering sludge and tiny stones seen on prior ultrasound. 5. Aortic atherosclerosis.   10/30/2020 Pathology Results   DIAGNOSIS:   BONE MARROW, ASPIRATE, CLOT, CORE:  -  Hypercellular bone marrow with panhyperplasia, atypia, and no  increase in blasts  -  See comment   PERIPHERAL BLOOD:  -  Marked thrombocytopenia  -  Absolute monocytosis  -  Normocytic anemia  -  See CBC data and comment   COMMENT:  The bone marrow is hypercellular for age (approximately 80%) with myeloid hyperplasia with maturational left shift, erythroid hyperplasia, and increased megakaryocytes.  Mild multilineage atypia is present.  Blasts are not increased on aspirate smears (1% by manual differential count) or by CD34 immunohistochemistry on the core  biopsy.  Concurrent flow cytometric analysis of the bone marrow aspirate demonstrates increased monocytes, and no increase in blasts or abnormal lymphoid population (see [id number]).  Monocytes are also increased in peripheral  blood, persistent per electronic medical record.  In aggregate, the  findings raise the possibility of a myeloid neoplasm with the  differential diagnosis including a low-grade myelodysplastic syndrome and chronic myelomonocytic leukemia (dysplastic type).    ADDENDUM:  A reticulin special stain performed on the bone marrow core biopsy reveals no significant increase in reticulin fibrosis.  ADDENDUM:  CYTOGENETIC RESULTS:  Karyotype: 46,XX[20]  Interpretation: NORMAL FEMALE KARYOTYPE   FISH RESULTS:  Results: NORMAL   ADDENDUM:  CD123 immunohistochemistry performed on the core biopsy highlights scattered aggregates of positively staining cells consistent with plasmacytoid dendritic cells.    10/30/2020 Pathology Results   DIAGNOSIS:   BONE MARROW; FLOW CYTOMETRIC ANALYSIS:  -  Increased monocytes  -  Scant B-cells present  -  No immunophenotypically aberrant T-cell population identified  -  No increase in blasts  -  See comment   COMMENT:  Monocytes are relatively increased (12% of all cells), without aberrant expression of CD56.  B-cells comprise <1% of total lymphocytes. CD34-positive blasts are not increased (<1% of all cells).  Correlation with concurrent morphology is recommended for complete diagnostic interpretation and overall blast enumeration (see [id number]).    10/30/2020 Pathology Results   FINAL MICROSCOPIC DIAGNOSIS:   A. LYMPH NODE, RIGHT AXILLARY, NEEDLE CORE BIOPSY:  -Lymphoid tissue present  -See comment   COMMENT:  The sections show several small needle core biopsy fragments of lymphoid tissue displaying degenerative cellular changes/necrosis and hence cannot be accurately evaluated.  Sample for flow cytometric analysis not available.   Immunohistochemical stain for CD3 and CD20 were performed with appropriate controls.  There is a mixture of T and B cells in their apparently respective compartments.  There is no definite metastatic malignancy.    11/11/2020 Pathology Results   DIAGNOSIS:   LEFT AXILLARY LYMPH NODE EXCISIONAL BIOPSY; FLOW CYTOMETRIC ANALYSIS:  -  No monotypic B-cell or immunophenotypically aberrant T-cell  population identified  -  See comment   COMMENT:  Flow cytometric analysis identifies B-cells with a normal kappa:lambda ratio of 1.7:1.  A subset of the polytypic B-cells expresses CD10. T-cells show a CD4:CD8 ratio of 3.3:1 without immunophenotypic aberrancy with the markers evaluated.  Although these results do not support the diagnosis of a clonal lymphoid population, sampling issues must always be considered when negative results are obtained, as focal lesions may not be represented in the specimen submitted.  In addition, flow cytometric immunophenotyping will not exclude other pathology if present (e.g. Hodgkin lymphoma, some T-cell lymphomas, metastatic and infectious diseases).   11/11/2020 Pathology Results   FINAL MICROSCOPIC DIAGNOSIS:   A. LYMPH NODE, LEFT AXILLARY, DISSECTION:  -  Follicular hyperplasia with interfollicular expansion  -  See comment   COMMENT:  Sections of the lymph nodes reveal generally preserved lymph node architecture.  There is follicular hyperplasia with some follicles showing increased hyalinization of germinal centers and mild concentric encircling of germinal centers with small lymphocytes (onion skinning). Interfollicular areas are expanded with increased vascular proliferation, small lymphocytes, plasma cells, histiocytic cells, and patchy neutrophils.  The lymph node capsule is variably thickened by fibrosis with few plasma cells.   A panel of immunohistochemical stains is performed for further  characterization.  CD3 and CD20/PAX5 highlight T-cell and B-cell   compartments, respectively.  Germinal centers are highlighted by CD10 and BCL6 with appropriate absent expression of BCL2.  CD5 is similar to CD3.  CD43 also stains the T-cells.  CD4-positive T-cells exceed CD8-positive T-cells.  CD30 highlights scattered immunoblasts.  CD21 reveals intact follicular dendritic cell meshworks.  CD68 highlights increased histiocytic cells.  HHV 8 is negative.  T. pallidum reveals no definitive organisms.  Pancytokeratin is negative.  TdT stains rare cells.  CD138 highlights plasma cells which are not increased in number and show polytypic light chain expression by kappa/lambda in situ  hybridization.  EBV is negative by in situ hybridization.   Concurrent flow cytometric analysis is negative for a monoclonal B-cell or immunophenotypically aberrant T-cell population (see [id number]).   Together, the findings above are non-specific and can be seen in  reactive and infectious processes as well as Castleman disease.  There is no definitive morphologic or flow cytometric evidence of involvement by a lymphoproliferative disorder from the current workup.   12/17/2020 Initial Diagnosis   [name] (chronic myelomonocytic leukemia) ([place])   12/28/2020 - 02/03/2022 Chemotherapy   Patient is on Treatment Plan : MYELODYSPLASIA  Azacitidine IV D1-5 q28d     03/18/2021 Pathology Results   Final Diagnosis    BONE MARROW:             Persistent chronic myelomonocytic leukemia in a hypercellular bone marrow with increased monocytes and no increase in blasts.   Comment    Flow cytometric analysis showed no increase in blasts and 17% monocytes.A next generation myeloid panel showed the presence of the following mutations: NRAS, SH2B3, PHF6 and TET2. CD34 and CD117 immunstains do not show an increase in blasts. The findings are consistent with persistent [name] with a decrease in the number of blasts compared to that seen on the previous bone marrow.    Final Interpretation      BONE MARROW;  FLOW CYTOMETRIC ANALYSIS:   No increased blasts identified. (see comment)  Monocytosis (17% of total events).    COMMENT: Flow cytometry identified about 0.6% of total events as immature cells. These cells express CD45 (dim), CD34, CD117, HLA-DR, CD38, CD33 (variable). This immunophenotype is consistent with normal myeloblasts. In addition, monocytes are increased and comprise of approximately 17% of total events. These monocytes show normal expression of CD33/CD64/CD14/CD11b with variable expression of CD4. Correlation with concurrent morphology and clinical data is recommended (see [id number]).    FLOW CYTOMETRY ANALYSIS: CD45 versus side scatter analysis demonstrate a predominance of granulocytes (~70%), monocytes (~17%) and lymphocytes (~6%). No significant increase in blasts identified. All tested markers were used for adequate analysis of the cells and were appropriately reviewed.     12/28/2021 -  Chemotherapy   Patient is on Treatment Plan : MYELODYSPLASIA  Azacitidine IV D1-5 q28d        INTERVAL HISTORY:  Kathleen Gibson is here for a follow up of [name], severe thrombocytopenia. She was last seen by NP Lacie on 03/28/22. She presents to the clinic alone. She reports she is feeling well overall. She tells me she did not receive Nplate as scheduled a few weeks ago, and she is unsure why.   All other systems were reviewed with the patient and are negative.  MEDICAL HISTORY:  Past Medical History:  Diagnosis Date   Diabetes ([place])    HLD (hyperlipidemia)    Hypertension    Pernicious anemia    Pneumonia 09/05/2019  Renal insufficiency 09/06/2019   Vitamin D deficiency     SURGICAL HISTORY: Past Surgical History:  Procedure Laterality Date   ABDOMINAL HERNIA REPAIR  2009   ABDOMINOPLASTY     AXILLARY LYMPH NODE BIOPSY Left 11/11/2020   Procedure: LEFT AXILLARY EXCISIONAL LYMPH NODE BIOPSY;  Surgeon: [name], MD;  Location: [facility];  Service: General;  Laterality: Left;    [place]   hemorroid surgery  2007   IR IMAGING GUIDED PORT INSERTION  07/26/2021   IR KYPHO EA ADDL LEVEL THORACIC OR LUMBAR  05/31/2021   IR KYPHO THORACIC WITH BONE BIOPSY  05/31/2021   TONSILLECTOMY AND ADENOIDECTOMY  1957    I have reviewed the social history and family history with the patient and they are unchanged from previous note.  ALLERGIES:  is allergic to asa [aspirin], nsaids, and red blood cells.  MEDICATIONS:  Current Outpatient Medications  Medication Sig Dispense Refill   cyanocobalamin (,VITAMIN B-12,) 1000 MCG/ML injection Inject 1,000 mcg into the muscle every 30 (thirty) days.     cyclobenzaprine (FLEXERIL) 5 MG tablet Take 1 tablet (5 mg total) by mouth 3 (three) times daily as needed for muscle spasms. 30 tablet 0   furosemide (LASIX) 20 MG tablet TAKE 1 TABLET BY MOUTH EVERY DAY FOR UP TO 7 DAYS THEN AS NEEDED FOR LEG EDEMA 90 tablet 1   glucose blood (ONETOUCH VERIO) test strip Monitor blood sugars twice daily 100 each 5   KLOR-CON M10 10 MEQ tablet TAKE 1 TABLET BY MOUTH TWICE A DAY TAKE ALONG WITH FUROSEMIDE ONLY 180 tablet 1   Lancets (ONETOUCH ULTRASOFT) lancets Monitor blood sugars twice daily 100 each 5   lidocaine-prilocaine (EMLA) cream APPLY 2 GRAMS TO PORT-A-CATH SITE 30-60 MINUTES PRIOR TO PORT ACCESS AS NEEDED 30 g 1   metoprolol succinate (TOPROL-XL) 25 MG 24 hr tablet Take 1 tablet (25 mg total) by mouth daily. 90 tablet 1   naloxone (NARCAN) nasal spray 4 mg/0.1 mL Place 1 spray into the nose once as needed (overdose). 2 each 0   ondansetron (ZOFRAN-ODT) 4 MG disintegrating tablet Take 1 tablet (4 mg total) by mouth every 8 (eight) hours as needed for nausea or vomiting. 20 tablet 0   Oxycodone HCl 10 MG TABS Take 1 tablet (10 mg total) by mouth 2 (two) times daily as needed. 1 tab every 4-6 hr prn for chronic pain 60 tablet 0   Oxycodone HCl 10 MG TABS Take 1 tablet (10 mg total) by mouth 2 (two) times daily as  needed. 1 tab every 4-6 hours as needed for pain 60 tablet 0   Oxycodone HCl 10 MG TABS Take 1 tablet (10 mg total) by mouth 2 (two) times daily as needed. 60 tablet 0   polyethylene glycol powder (GLYCOLAX/MIRALAX) 17 GM/SCOOP powder Dissolve 1 capful (17 g) in water and drink 2 (two) times daily. (Patient taking differently: Take 17 g by mouth daily.) 510 g 2   pregabalin (LYRICA) 150 MG capsule Take 1 capsule (150 mg total) by mouth 2 (two) times daily. 180 capsule 1   senna-docusate (SENOKOT-S) 8.6-50 MG tablet Take 2 tablets by mouth at bedtime as needed for mild constipation. 60 tablet 1   sitaGLIPtin-metformin (JANUMET) 50-1000 MG tablet Take 1 tablet by mouth at bedtime. 90 tablet 1   No current facility-administered medications for this visit.   Facility-Administered Medications Ordered in Other Visits  Medication Dose Route Frequency Provider  Last Rate Last Admin   0.9 %  sodium chloride infusion (Manually program via Guardrails IV Fluids)  250 mL Intravenous Once Truitt Merle, MD        PHYSICAL EXAMINATION: ECOG PERFORMANCE STATUS: 1 - Symptomatic but completely ambulatory  Vitals:   04/25/22 0914  BP: 121/64  Pulse: 89  Resp: 16  Temp: 97.6 F (36.4 C)  SpO2: 100%   Wt Readings from Last 3 Encounters:  04/25/22 151 lb 1.6 oz (68.5 kg)  04/04/22 144 lb 9.6 oz (65.6 kg)  03/28/22 147 lb 9.6 oz (67 kg)     GENERAL:alert, no distress and comfortable SKIN: skin color normal, no rashes or significant lesions EYES: normal, Conjunctiva are pink and non-injected, sclera clear  NEURO: alert & oriented x 3 with fluent speech  LABORATORY DATA:  I have reviewed the data as listed    Latest Ref Rng & Units 04/25/2022    8:45 AM 04/18/2022    8:11 AM 04/11/2022    7:56 AM  CBC  WBC 4.0 - 10.5 K/uL 3.6  5.0  8.5   Hemoglobin 12.0 - 15.0 g/dL 8.3  8.2  9.7   Hematocrit 36.0 - 46.0 % 26.6  26.0  30.7   Platelets 150 - 400 K/uL 78  38  30         Latest Ref Rng & Units  04/25/2022    8:45 AM 04/18/2022    8:11 AM 04/05/2022   10:08 AM  CMP  Glucose 70 - 99 mg/dL 126  225  168   BUN 8 - 23 mg/dL _0 Creatinine 0.44 - 1.00 mg/dL 0.59  0.70  0.64   Sodium 135 - 145 mmol/L 139  139  137   Potassium 3.5 - 5.1 mmol/L 4.2  3.8  4.4   Chloride 98 - 111 mmol/L 107  106  106   CO2 22 - 32 mmol/L _1 Calcium 8.9 - 10.3 mg/dL 8.4  8.4  8.6   Total Protein 6.5 - 8.1 g/dL 6.9   6.7   Total Bilirubin 0.3 - 1.2 mg/dL 1.1   1.1   Alkaline Phos 38 - 126 U/L 116   113   AST 15 - 41 U/L 19   20   ALT 0 - 44 U/L 18   25       RADIOGRAPHIC STUDIES: I have personally reviewed the radiological images as listed and agreed with the findings in the report. No results found.    No orders of the defined types were placed in this encounter.  All questions were answered. The patient knows to call the clinic with any problems, questions or concerns. No barriers to learning was detected. The total time spent in the appointment was 30 minutes.     Truitt Merle, MD 04/25/2022   I, Wilburn Mylar, am acting as scribe for Truitt Merle, MD.   I have reviewed the above documentation for accuracy and completeness, and I agree with the above.

## 2022-04-26 ENCOUNTER — Inpatient Hospital Stay: Payer: Medicare HMO

## 2022-04-26 ENCOUNTER — Ambulatory Visit: Payer: Medicare HMO

## 2022-04-26 VITALS — BP 132/75 | HR 82 | Temp 97.7°F | Resp 18

## 2022-04-26 DIAGNOSIS — Z5111 Encounter for antineoplastic chemotherapy: Secondary | ICD-10-CM | POA: Diagnosis not present

## 2022-04-26 DIAGNOSIS — D693 Immune thrombocytopenic purpura: Secondary | ICD-10-CM | POA: Diagnosis not present

## 2022-04-26 DIAGNOSIS — C9310 Chronic myelomonocytic leukemia not having achieved remission: Secondary | ICD-10-CM

## 2022-04-26 DIAGNOSIS — Z7952 Long term (current) use of systemic steroids: Secondary | ICD-10-CM | POA: Diagnosis not present

## 2022-04-26 DIAGNOSIS — I10 Essential (primary) hypertension: Secondary | ICD-10-CM | POA: Diagnosis not present

## 2022-04-26 DIAGNOSIS — Z79899 Other long term (current) drug therapy: Secondary | ICD-10-CM | POA: Diagnosis not present

## 2022-04-26 DIAGNOSIS — Z9884 Bariatric surgery status: Secondary | ICD-10-CM | POA: Diagnosis not present

## 2022-04-26 DIAGNOSIS — D510 Vitamin B12 deficiency anemia due to intrinsic factor deficiency: Secondary | ICD-10-CM | POA: Diagnosis not present

## 2022-04-26 DIAGNOSIS — E119 Type 2 diabetes mellitus without complications: Secondary | ICD-10-CM | POA: Diagnosis not present

## 2022-04-26 DIAGNOSIS — Z7984 Long term (current) use of oral hypoglycemic drugs: Secondary | ICD-10-CM | POA: Diagnosis not present

## 2022-04-26 MED ORDER — SODIUM CHLORIDE 0.9% FLUSH
10.0000 mL | INTRAVENOUS | Status: DC | PRN
  Administered 2022-04-26: 10 mL

## 2022-04-26 MED ORDER — HEPARIN SOD (PORK) LOCK FLUSH 100 UNIT/ML IV SOLN
500.0000 [IU] | Freq: Once | INTRAVENOUS | Status: AC | PRN
  Administered 2022-04-26: 500 [IU]

## 2022-04-26 MED ORDER — SODIUM CHLORIDE 0.9 % IV SOLN: Freq: Once | INTRAVENOUS | Status: AC

## 2022-04-26 MED ORDER — SODIUM CHLORIDE 0.9 % IV SOLN
75.0000 mg/m2 | Freq: Once | INTRAVENOUS | Status: AC
  Administered 2022-04-26: 130 mg via INTRAVENOUS
  Filled 2022-04-26: qty 13

## 2022-04-26 MED ORDER — SODIUM CHLORIDE 0.9 % IV SOLN
10.0000 mg | Freq: Once | INTRAVENOUS | Status: AC
  Administered 2022-04-26: 10 mg via INTRAVENOUS
  Filled 2022-04-26: qty 10

## 2022-04-26 MED FILL — Dexamethasone Sodium Phosphate Inj 100 MG/10ML: INTRAMUSCULAR | Qty: 1 | Status: AC

## 2022-04-26 NOTE — Patient Instructions (Signed)
[facility]   Discharge Instructions: Thank you for choosing [facility] to provide your oncology and hematology care.   If you have a lab appointment with the Cancer Center, please go directly to the Cancer Center and check in at the registration area.   Wear comfortable clothing and clothing appropriate for easy access to any Portacath or PICC line.   We strive to give you quality time with your provider. You may need to reschedule your appointment if you arrive late (15 or more minutes).  Arriving late affects you and other patients whose appointments are after yours.  Also, if you miss three or more appointments without notifying the office, you may be dismissed from the clinic at the provider's discretion.      For prescription refill requests, have your pharmacy contact our office and allow 72 hours for refills to be completed.    Today you received the following chemotherapy and/or immunotherapy agents: Azacitidine      To help prevent nausea and vomiting after your treatment, we encourage you to take your nausea medication as directed.  BELOW ARE SYMPTOMS THAT SHOULD BE REPORTED IMMEDIATELY: *FEVER GREATER THAN 100.4 F (38 C) OR HIGHER *CHILLS OR SWEATING *NAUSEA AND VOMITING THAT IS NOT CONTROLLED WITH YOUR NAUSEA MEDICATION *UNUSUAL SHORTNESS OF BREATH *UNUSUAL BRUISING OR BLEEDING *URINARY PROBLEMS (pain or burning when urinating, or frequent urination) *BOWEL PROBLEMS (unusual diarrhea, constipation, pain near the anus) TENDERNESS IN MOUTH AND THROAT WITH OR WITHOUT PRESENCE OF ULCERS (sore throat, sores in mouth, or a toothache) UNUSUAL RASH, SWELLING OR PAIN  UNUSUAL VAGINAL DISCHARGE OR ITCHING   Items with * indicate a potential emergency and should be followed up as soon as possible or go to the Emergency Department if any problems should occur.  Please show the CHEMOTHERAPY ALERT CARD or IMMUNOTHERAPY ALERT CARD at check-in  to the Emergency Department and triage nurse.  Should you have questions after your visit or need to cancel or reschedule your appointment, please contact [facility]  Dept: [phone]  and follow the prompts.  Office hours are 8:00 a.m. to 4:30 p.m. Monday - Friday. Please note that voicemails left after 4:00 p.m. may not be returned until the following business day.  We are closed weekends and major holidays. You have access to a nurse at all times for urgent questions. Please call the main number to the clinic Dept: [phone] and follow the prompts.   For any non-urgent questions, you may also contact your provider using MyChart. We now offer e-Visits for anyone 18 and older to request care online for non-urgent symptoms. For details visit [email or web address].   Also download the MyChart app! Go to the app store, search "MyChart", open the app, select [facility], and log in with your MyChart username and password.  Masks are optional in the cancer centers. If you would like for your care team to wear a mask while they are taking care of you, please let them know. You may have one support person who is at least 70 years old accompany you for your appointments. 

## 2022-04-27 ENCOUNTER — Ambulatory Visit: Payer: Medicare HMO

## 2022-04-27 ENCOUNTER — Inpatient Hospital Stay: Payer: Medicare HMO

## 2022-04-27 VITALS — BP 117/57 | HR 95 | Temp 98.9°F | Resp 17

## 2022-04-27 DIAGNOSIS — Z5111 Encounter for antineoplastic chemotherapy: Secondary | ICD-10-CM | POA: Diagnosis not present

## 2022-04-27 DIAGNOSIS — I10 Essential (primary) hypertension: Secondary | ICD-10-CM | POA: Diagnosis not present

## 2022-04-27 DIAGNOSIS — Z9884 Bariatric surgery status: Secondary | ICD-10-CM | POA: Diagnosis not present

## 2022-04-27 DIAGNOSIS — Z79899 Other long term (current) drug therapy: Secondary | ICD-10-CM | POA: Diagnosis not present

## 2022-04-27 DIAGNOSIS — E119 Type 2 diabetes mellitus without complications: Secondary | ICD-10-CM | POA: Diagnosis not present

## 2022-04-27 DIAGNOSIS — Z7952 Long term (current) use of systemic steroids: Secondary | ICD-10-CM | POA: Diagnosis not present

## 2022-04-27 DIAGNOSIS — C9310 Chronic myelomonocytic leukemia not having achieved remission: Secondary | ICD-10-CM

## 2022-04-27 DIAGNOSIS — D693 Immune thrombocytopenic purpura: Secondary | ICD-10-CM | POA: Diagnosis not present

## 2022-04-27 DIAGNOSIS — Z7984 Long term (current) use of oral hypoglycemic drugs: Secondary | ICD-10-CM | POA: Diagnosis not present

## 2022-04-27 DIAGNOSIS — D510 Vitamin B12 deficiency anemia due to intrinsic factor deficiency: Secondary | ICD-10-CM | POA: Diagnosis not present

## 2022-04-27 MED ORDER — SODIUM CHLORIDE 0.9% FLUSH
10.0000 mL | INTRAVENOUS | Status: DC | PRN
  Administered 2022-04-27: 10 mL

## 2022-04-27 MED ORDER — HEPARIN SOD (PORK) LOCK FLUSH 100 UNIT/ML IV SOLN
500.0000 [IU] | Freq: Once | INTRAVENOUS | Status: AC | PRN
  Administered 2022-04-27: 500 [IU]

## 2022-04-27 MED ORDER — SODIUM CHLORIDE 0.9 % IV SOLN
75.0000 mg/m2 | Freq: Once | INTRAVENOUS | Status: AC
  Administered 2022-04-27: 130 mg via INTRAVENOUS
  Filled 2022-04-27: qty 13

## 2022-04-27 MED ORDER — SODIUM CHLORIDE 0.9 % IV SOLN: Freq: Once | INTRAVENOUS | Status: AC

## 2022-04-27 MED ORDER — SODIUM CHLORIDE 0.9 % IV SOLN
10.0000 mg | Freq: Once | INTRAVENOUS | Status: AC
  Administered 2022-04-27: 10 mg via INTRAVENOUS
  Filled 2022-04-27: qty 10

## 2022-04-27 MED FILL — Dexamethasone Sodium Phosphate Inj 100 MG/10ML: INTRAMUSCULAR | Qty: 1 | Status: AC

## 2022-04-27 NOTE — Patient Instructions (Signed)
Azacitidine Injection What is this medication? AZACITIDINE (ay [place]) treats blood and bone marrow cancers. It works by slowing down the growth of cancer cells. This medicine may be used for other purposes; ask your health care provider or pharmacist if you have questions. COMMON BRAND NAME(S): Vidaza What should I tell my care team before I take this medication? They need to know if you have any of these conditions: Kidney disease Liver disease Low blood cell levels, such as low white cells, platelets, or red blood cells Low levels of albumin in the blood Low levels of bicarbonate in the blood An unusual or allergic reaction to azacitidine, mannitol, other medications, foods, dyes, or preservatives If you or your partner are pregnant or trying to get pregnant Breast-feeding How should I use this medication? This medication is injected into a vein or under the skin. It is given by your care team in a hospital or clinic setting. Talk to your care team about the use of this medication in children. While it may be prescribed for children as young as 1 month for selected conditions, precautions do apply. Overdosage: If you think you have taken too much of this medicine contact a poison control center or emergency room at once. NOTE: This medicine is only for you. Do not share this medicine with others. What if I miss a dose? Keep appointments for follow-up doses. It is important not to miss your dose. Call your care team if you are unable to keep an appointment. What may interact with this medication? Interactions are not expected. This list may not describe all possible interactions. Give your health care provider a list of all the medicines, herbs, non-prescription drugs, or dietary supplements you use. Also tell them if you smoke, drink alcohol, or use illegal drugs. Some items may interact with your medicine. What should I watch for while using this medication? Your condition will  be monitored carefully while you are receiving this medication. You may need blood work while taking this medication. This medication may make you feel generally unwell. This is not uncommon as chemotherapy can affect healthy cells as well as cancer cells. Report any side effects. Continue your course of treatment even though you feel ill unless your care team tells you to stop. Other product types may be available that contain the medication azacitidine. The injection and oral products should not be used in place of one another. Talk to your care team if you have questions. This medication can cause serious side effects. To reduce the risk, your care team may give you other medications to take before receiving this one. Be sure to follow the directions from your care team. This medication may increase your risk of getting an infection. Call your care team for advice if you get a fever, chills, sore throat, or other symptoms of a cold or flu. Do not treat yourself. Try to avoid being around people who are sick. Avoid taking medications that contain aspirin, acetaminophen, ibuprofen, naproxen, or ketoprofen unless instructed by your care team. These medications may hide a fever. This medication may increase your risk to bruise or bleed. Call your care team if you notice any unusual bleeding. Be careful brushing or flossing your teeth or using a toothpick because you may get an infection or bleed more easily. If you have any dental work done, tell your dentist you are receiving this medication. Talk to your care team if you or your partner may be pregnant. Serious  birth defects can occur if you take this medication during pregnancy and for 6 months after the last dose. You will need a negative pregnancy test before starting this medication. Contraception is recommended while taking his medication and for 6 months after the last dose. Your care team can help you find the option that works for you. If your  partner can get pregnant, use a condom during sex while taking this medication and for 3 months after the last dose. Do not breastfeed while taking this medication and for 1 week after the last dose. This medication may cause infertility. Talk to your care team if you are concerned about your fertility. What side effects may I notice from receiving this medication? Side effects that you should report to your care team as soon as possible: Allergic reactions--skin rash, itching, hives, swelling of the face, lips, tongue, or throat Infection--fever, chills, cough, sore throat, wounds that don't heal, pain or trouble when passing urine, general feeling of discomfort or being unwell Kidney injury--decrease in the amount of urine, swelling of the ankles, hands, or feet Liver injury--right upper belly pain, loss of appetite, nausea, light-colored stool, dark yellow or brown urine, yellowing skin or eyes, unusual weakness or fatigue Low red blood cell level--unusual weakness or fatigue, dizziness, headache, trouble breathing Tumor lysis syndrome (TLS)--nausea, vomiting, diarrhea, decrease in the amount of urine, dark urine, unusual weakness or fatigue, confusion, muscle pain or cramps, fast or irregular heartbeat, joint pain Unusual bruising or bleeding Side effects that usually do not require medical attention (report to your care team if they continue or are bothersome): Constipation Diarrhea Nausea Pain, redness, or irritation at injection site Vomiting This list may not describe all possible side effects. Call your doctor for medical advice about side effects. You may report side effects to FDA at 1-800-FDA-1088. Where should I keep my medication? This medication is given in a hospital or clinic. It will not be stored at home. NOTE: This sheet is a summary. It may not cover all possible information. If you have questions about this medicine, talk to your doctor, pharmacist, or health care  provider.  2023 Elsevier/Gold Standard (2021-10-14 00:00:00)

## 2022-04-28 ENCOUNTER — Inpatient Hospital Stay: Payer: Medicare HMO

## 2022-04-28 ENCOUNTER — Ambulatory Visit: Payer: Medicare HMO

## 2022-04-28 VITALS — BP 118/58 | HR 88 | Temp 98.2°F | Resp 18

## 2022-04-28 DIAGNOSIS — D693 Immune thrombocytopenic purpura: Secondary | ICD-10-CM | POA: Diagnosis not present

## 2022-04-28 DIAGNOSIS — Z7984 Long term (current) use of oral hypoglycemic drugs: Secondary | ICD-10-CM | POA: Diagnosis not present

## 2022-04-28 DIAGNOSIS — Z5111 Encounter for antineoplastic chemotherapy: Secondary | ICD-10-CM | POA: Diagnosis not present

## 2022-04-28 DIAGNOSIS — Z79899 Other long term (current) drug therapy: Secondary | ICD-10-CM | POA: Diagnosis not present

## 2022-04-28 DIAGNOSIS — I10 Essential (primary) hypertension: Secondary | ICD-10-CM | POA: Diagnosis not present

## 2022-04-28 DIAGNOSIS — Z9884 Bariatric surgery status: Secondary | ICD-10-CM | POA: Diagnosis not present

## 2022-04-28 DIAGNOSIS — C9310 Chronic myelomonocytic leukemia not having achieved remission: Secondary | ICD-10-CM | POA: Diagnosis not present

## 2022-04-28 DIAGNOSIS — Z7952 Long term (current) use of systemic steroids: Secondary | ICD-10-CM | POA: Diagnosis not present

## 2022-04-28 DIAGNOSIS — E119 Type 2 diabetes mellitus without complications: Secondary | ICD-10-CM | POA: Diagnosis not present

## 2022-04-28 DIAGNOSIS — D510 Vitamin B12 deficiency anemia due to intrinsic factor deficiency: Secondary | ICD-10-CM | POA: Diagnosis not present

## 2022-04-28 MED ORDER — SODIUM CHLORIDE 0.9 % IV SOLN: Freq: Once | INTRAVENOUS | Status: AC

## 2022-04-28 MED ORDER — SODIUM CHLORIDE 0.9 % IV SOLN
10.0000 mg | Freq: Once | INTRAVENOUS | Status: AC
  Administered 2022-04-28: 10 mg via INTRAVENOUS
  Filled 2022-04-28: qty 10

## 2022-04-28 MED ORDER — SODIUM CHLORIDE 0.9 % IV SOLN
75.0000 mg/m2 | Freq: Once | INTRAVENOUS | Status: AC
  Administered 2022-04-28: 130 mg via INTRAVENOUS
  Filled 2022-04-28: qty 13

## 2022-04-28 MED FILL — Dexamethasone Sodium Phosphate Inj 100 MG/10ML: INTRAMUSCULAR | Qty: 1 | Status: AC

## 2022-04-29 ENCOUNTER — Ambulatory Visit: Payer: Medicare HMO

## 2022-04-29 ENCOUNTER — Inpatient Hospital Stay: Payer: Medicare HMO

## 2022-04-29 VITALS — BP 112/78 | HR 72 | Temp 98.2°F | Resp 18

## 2022-04-29 DIAGNOSIS — Z9884 Bariatric surgery status: Secondary | ICD-10-CM | POA: Diagnosis not present

## 2022-04-29 DIAGNOSIS — C9310 Chronic myelomonocytic leukemia not having achieved remission: Secondary | ICD-10-CM | POA: Diagnosis not present

## 2022-04-29 DIAGNOSIS — Z7952 Long term (current) use of systemic steroids: Secondary | ICD-10-CM | POA: Diagnosis not present

## 2022-04-29 DIAGNOSIS — Z7984 Long term (current) use of oral hypoglycemic drugs: Secondary | ICD-10-CM | POA: Diagnosis not present

## 2022-04-29 DIAGNOSIS — Z5111 Encounter for antineoplastic chemotherapy: Secondary | ICD-10-CM | POA: Diagnosis not present

## 2022-04-29 DIAGNOSIS — D693 Immune thrombocytopenic purpura: Secondary | ICD-10-CM | POA: Diagnosis not present

## 2022-04-29 DIAGNOSIS — Z79899 Other long term (current) drug therapy: Secondary | ICD-10-CM | POA: Diagnosis not present

## 2022-04-29 DIAGNOSIS — E119 Type 2 diabetes mellitus without complications: Secondary | ICD-10-CM | POA: Diagnosis not present

## 2022-04-29 DIAGNOSIS — I10 Essential (primary) hypertension: Secondary | ICD-10-CM | POA: Diagnosis not present

## 2022-04-29 DIAGNOSIS — D510 Vitamin B12 deficiency anemia due to intrinsic factor deficiency: Secondary | ICD-10-CM | POA: Diagnosis not present

## 2022-04-29 MED ORDER — SODIUM CHLORIDE 0.9 % IV SOLN: Freq: Once | INTRAVENOUS | Status: AC

## 2022-04-29 MED ORDER — SODIUM CHLORIDE 0.9 % IV SOLN
75.0000 mg/m2 | Freq: Once | INTRAVENOUS | Status: AC
  Administered 2022-04-29: 130 mg via INTRAVENOUS
  Filled 2022-04-29: qty 13

## 2022-04-29 MED ORDER — SODIUM CHLORIDE 0.9 % IV SOLN
10.0000 mg | Freq: Once | INTRAVENOUS | Status: AC
  Administered 2022-04-29: 10 mg via INTRAVENOUS
  Filled 2022-04-29: qty 10

## 2022-04-29 NOTE — Patient Instructions (Signed)
[facility]   Discharge Instructions: Thank you for choosing [facility] to provide your oncology and hematology care.   If you have a lab appointment with the Cancer Center, please go directly to the Cancer Center and check in at the registration area.   Wear comfortable clothing and clothing appropriate for easy access to any Portacath or PICC line.   We strive to give you quality time with your provider. You may need to reschedule your appointment if you arrive late (15 or more minutes).  Arriving late affects you and other patients whose appointments are after yours.  Also, if you miss three or more appointments without notifying the office, you may be dismissed from the clinic at the provider's discretion.      For prescription refill requests, have your pharmacy contact our office and allow 72 hours for refills to be completed.    Today you received the following chemotherapy and/or immunotherapy agents: Azacitidine      To help prevent nausea and vomiting after your treatment, we encourage you to take your nausea medication as directed.  BELOW ARE SYMPTOMS THAT SHOULD BE REPORTED IMMEDIATELY: *FEVER GREATER THAN 100.4 F (38 C) OR HIGHER *CHILLS OR SWEATING *NAUSEA AND VOMITING THAT IS NOT CONTROLLED WITH YOUR NAUSEA MEDICATION *UNUSUAL SHORTNESS OF BREATH *UNUSUAL BRUISING OR BLEEDING *URINARY PROBLEMS (pain or burning when urinating, or frequent urination) *BOWEL PROBLEMS (unusual diarrhea, constipation, pain near the anus) TENDERNESS IN MOUTH AND THROAT WITH OR WITHOUT PRESENCE OF ULCERS (sore throat, sores in mouth, or a toothache) UNUSUAL RASH, SWELLING OR PAIN  UNUSUAL VAGINAL DISCHARGE OR ITCHING   Items with * indicate a potential emergency and should be followed up as soon as possible or go to the Emergency Department if any problems should occur.  Please show the CHEMOTHERAPY ALERT CARD or IMMUNOTHERAPY ALERT CARD at check-in  to the Emergency Department and triage nurse.  Should you have questions after your visit or need to cancel or reschedule your appointment, please contact [facility]  Dept: [phone]  and follow the prompts.  Office hours are 8:00 a.m. to 4:30 p.m. Monday - Friday. Please note that voicemails left after 4:00 p.m. may not be returned until the following business day.  We are closed weekends and major holidays. You have access to a nurse at all times for urgent questions. Please call the main number to the clinic Dept: [phone] and follow the prompts.   For any non-urgent questions, you may also contact your provider using MyChart. We now offer e-Visits for anyone 18 and older to request care online for non-urgent symptoms. For details visit [email or web address].   Also download the MyChart app! Go to the app store, search "MyChart", open the app, select [facility], and log in with your MyChart username and password.  Masks are optional in the cancer centers. If you would like for your care team to wear a mask while they are taking care of you, please let them know. You may have one support person who is at least 70 years old accompany you for your appointments. 

## 2022-05-03 DIAGNOSIS — D693 Immune thrombocytopenic purpura: Secondary | ICD-10-CM | POA: Diagnosis not present

## 2022-05-03 DIAGNOSIS — C9310 Chronic myelomonocytic leukemia not having achieved remission: Secondary | ICD-10-CM | POA: Diagnosis not present

## 2022-05-10 ENCOUNTER — Encounter: Payer: Self-pay | Admitting: Hematology

## 2022-05-10 DIAGNOSIS — D693 Immune thrombocytopenic purpura: Secondary | ICD-10-CM | POA: Diagnosis not present

## 2022-05-10 DIAGNOSIS — C9310 Chronic myelomonocytic leukemia not having achieved remission: Secondary | ICD-10-CM | POA: Diagnosis not present

## 2022-05-13 ENCOUNTER — Encounter: Payer: Self-pay | Admitting: Hematology

## 2022-05-14 ENCOUNTER — Emergency Department (HOSPITAL_COMMUNITY)
Admission: EM | Admit: 2022-05-14 | Discharge: 2022-05-14 | Discharge status: Against Medical Advice | Payer: Medicare HMO | Attending: Emergency Medicine | Admitting: Emergency Medicine

## 2022-05-14 ENCOUNTER — Emergency Department (HOSPITAL_COMMUNITY): Payer: Medicare HMO

## 2022-05-14 ENCOUNTER — Other Ambulatory Visit: Payer: Self-pay

## 2022-05-14 ENCOUNTER — Encounter (HOSPITAL_COMMUNITY): Payer: Self-pay

## 2022-05-14 DIAGNOSIS — R0789 Other chest pain: Secondary | ICD-10-CM | POA: Diagnosis not present

## 2022-05-14 DIAGNOSIS — Z5321 Procedure and treatment not carried out due to patient leaving prior to being seen by health care provider: Secondary | ICD-10-CM | POA: Insufficient documentation

## 2022-05-14 DIAGNOSIS — R0981 Nasal congestion: Secondary | ICD-10-CM | POA: Diagnosis not present

## 2022-05-14 DIAGNOSIS — U071 COVID-19: Secondary | ICD-10-CM | POA: Insufficient documentation

## 2022-05-14 LAB — RESP PANEL BY RT-PCR (FLU A&B, COVID) ARPGX2
Influenza A by PCR: NEGATIVE
Influenza B by PCR: NEGATIVE
SARS Coronavirus 2 by RT PCR: POSITIVE — AB

## 2022-05-14 NOTE — ED Notes (Signed)
Pt stated that her port must be used for blood work.

## 2022-05-14 NOTE — ED Triage Notes (Signed)
C/o chest tightness x2 days with congestion, nonproductive cough, and runny nose.  Covid + last night

## 2022-05-14 NOTE — ED Provider Triage Note (Signed)
Emergency Medicine Provider Triage Evaluation Note  Kathleen Gibson , a 71 y.o. female  was evaluated in triage.  Pt complains of chest tightness, congestion, non-productive cough, and runny nose since Thursday.  Denies fevers.  She is currently undergoing treatment for leukemia.  Took an at home COVID test which was positive last night.  Denies nausea, vomiting, diarrhea.  Also reports headache.   Review of Systems  Positive: See above Negative: See above  Physical Exam  BP (!) 129/59 (BP Location: Left Arm)   Pulse 87   Temp 98.4 F (36.9 C) (Oral)   Resp 16   SpO2 100%  Gen:   Awake, no distress   Resp:  Normal effort  MSK:   Moves extremities without difficulty  Other:  Mildly reduced lung sounds on the right side, patient cannot take deep breath due to pain; lungs clear to auscultation on the left  Medical Decision Making  Medically screening exam initiated at 11:19 AM.  Appropriate orders placed.  MELVINIA ASHBY was informed that the remainder of the evaluation will be completed by another provider, this initial triage assessment does not replace that evaluation, and the importance of remaining in the ED until their evaluation is complete.     Theressa Stamps R, [place] 05/14/22 1120

## 2022-05-16 ENCOUNTER — Encounter: Payer: Self-pay | Admitting: Family Medicine

## 2022-05-16 ENCOUNTER — Telehealth: Payer: Self-pay

## 2022-05-16 ENCOUNTER — Inpatient Hospital Stay: Payer: Medicare HMO | Attending: Hematology

## 2022-05-16 ENCOUNTER — Telehealth (INDEPENDENT_AMBULATORY_CARE_PROVIDER_SITE_OTHER): Payer: Medicare HMO | Admitting: Family Medicine

## 2022-05-16 ENCOUNTER — Other Ambulatory Visit: Payer: Medicare HMO

## 2022-05-16 DIAGNOSIS — E785 Hyperlipidemia, unspecified: Secondary | ICD-10-CM | POA: Insufficient documentation

## 2022-05-16 DIAGNOSIS — J208 Acute bronchitis due to other specified organisms: Secondary | ICD-10-CM

## 2022-05-16 DIAGNOSIS — Z9884 Bariatric surgery status: Secondary | ICD-10-CM | POA: Insufficient documentation

## 2022-05-16 DIAGNOSIS — I10 Essential (primary) hypertension: Secondary | ICD-10-CM | POA: Insufficient documentation

## 2022-05-16 DIAGNOSIS — U071 COVID-19: Secondary | ICD-10-CM

## 2022-05-16 DIAGNOSIS — Z7984 Long term (current) use of oral hypoglycemic drugs: Secondary | ICD-10-CM | POA: Insufficient documentation

## 2022-05-16 DIAGNOSIS — D693 Immune thrombocytopenic purpura: Secondary | ICD-10-CM | POA: Insufficient documentation

## 2022-05-16 DIAGNOSIS — Z79899 Other long term (current) drug therapy: Secondary | ICD-10-CM | POA: Insufficient documentation

## 2022-05-16 DIAGNOSIS — Z5111 Encounter for antineoplastic chemotherapy: Secondary | ICD-10-CM | POA: Insufficient documentation

## 2022-05-16 DIAGNOSIS — E119 Type 2 diabetes mellitus without complications: Secondary | ICD-10-CM | POA: Insufficient documentation

## 2022-05-16 DIAGNOSIS — C9310 Chronic myelomonocytic leukemia not having achieved remission: Secondary | ICD-10-CM | POA: Insufficient documentation

## 2022-05-16 HISTORY — DX: Acute bronchitis due to other specified organisms: J20.8

## 2022-05-16 MED ORDER — PROMETHAZINE-DM 6.25-15 MG/5ML PO SYRP: 5.0000 mL | ORAL_SOLUTION | Freq: Four times a day (QID) | ORAL | 0 refills | Status: DC | PRN

## 2022-05-16 MED ORDER — AZITHROMYCIN 250 MG PO TABS: ORAL_TABLET | ORAL | 0 refills | Status: DC

## 2022-05-16 MED ORDER — MOLNUPIRAVIR EUA 200MG CAPSULE: 4.0000 | ORAL_CAPSULE | Freq: Two times a day (BID) | ORAL | 0 refills | Status: DC

## 2022-05-16 NOTE — Progress Notes (Signed)
Virtual telephone visit    Virtual Visit via Telephone Note   This visit type was conducted due to national recommendations for restrictions regarding the COVID-19 Pandemic (e.g. social distancing) in an effort to limit this patient's exposure and mitigate transmission in our community. Due to her co-morbid illnesses, this patient is at least at moderate risk for complications without adequate follow up. This format is felt to be most appropriate for this patient at this time. The patient did not have access to video technology or had technical difficulties with video requiring transitioning to audio format only (telephone). Physical exam was limited to content and character of the telephone converstion. Nira Conn was able to get the patient set up on a telephone visit.   Patient location: Home Patient and provider in visit Provider location: Office  I discussed the limitations of evaluation and management by telemedicine and the availability of in person appointments. The patient expressed understanding and agreed to proceed.   Visit Date: 05/16/2022  Today's healthcare provider: Ann Held, DO     Subjective:    Patient ID: Kathleen Gibson, female    DOB: 19-Jul-1950, 71 y.o.   MRN: [id number]  Chief Complaint  Patient presents with   Covid Positive    Pt tested positive on Friday. Pt went to the ED on Saturday and tested positive but wasn't seen by provider. Pt having cough, headache, chest congestion, fatigue.     HPI Patient is in today for an office visit via telephone.  She tested positive for Covid on 05/13/2022 and symptoms started on 05/12/2022. Symptoms included headache, chest congestion, non productive cough, chest tightness, low-grade fever (99), fatigue and sore throat. She took Tylenol to alleviate her symptoms. She states that her sore throat is improving since Thursday and denies of any fever as of today. Her non productive cough has since developed into a  productive one. She states that her mucus is brown in color. She does cough throughout the night but notes that she had difficulty sleeping prior to her symptoms. She has concern because she starts chemo treatment on 05/23/2022.  Her A1C levels are stable.  Lab Results  Component Value Date   HGBA1C 7.4 (A) 04/04/2022   HGBA1C 7.4 04/04/2022   HGBA1C 7.4 (A) 04/04/2022   HGBA1C 7.4 (A) 04/04/2022   Past Medical History:  Diagnosis Date   Diabetes ([place])    HLD (hyperlipidemia)    Hypertension    Pernicious anemia    Pneumonia 09/05/2019   Renal insufficiency 09/06/2019   Vitamin D deficiency     Past Surgical History:  Procedure Laterality Date   ABDOMINAL HERNIA REPAIR  2009   ABDOMINOPLASTY     AXILLARY LYMPH NODE BIOPSY Left 11/11/2020   Procedure: LEFT AXILLARY EXCISIONAL LYMPH NODE BIOPSY;  Surgeon: [name], MD;  Location: [facility];  Service: General;  Laterality: Left;   [place]   hemorroid surgery  2007   IR IMAGING GUIDED PORT INSERTION  07/26/2021   IR KYPHO EA ADDL LEVEL THORACIC OR LUMBAR  05/31/2021   IR KYPHO THORACIC WITH BONE BIOPSY  05/31/2021   TONSILLECTOMY AND ADENOIDECTOMY  1957    Family History  Problem Relation Age of Onset   Cancer Mother 96       unknown type cancer    Hypertension Mother    Hypertension Father    Heart failure Father    Pneumonia Father  Diabetes Sister    Leukemia Brother    Colon cancer Brother 43   Heart Problems Brother    Leukemia Other    Diabetes Sister    Heart Problems Sister    Cancer Brother    Heart Problems Brother     Social History   Socioeconomic History   Marital status: Divorced    Spouse name: Not on file   Number of children: 3   Years of education: 12   Highest education level: Not on file  Occupational History   Occupation: Retired  Tobacco Use   Smoking status: Never    Passive exposure: Never   Smokeless tobacco: Never  Vaping Use   Vaping Use:  Never used  Substance and Sexual Activity   Alcohol use: No   Drug use: No   Sexual activity: Not Currently  Other Topics Concern   Not on file  Social History Narrative   Divorced. Retired Pension scheme manager.   12th grade education.   Drinks caffeine.   Smoke alarm in the home. Wears her seatbelt.   Feels safe in her relationships.   Two story home   Right handed    Social Determinants of Health   Financial Resource Strain: Low Risk  (09/07/2021)   Overall Financial Resource Strain (CARDIA)    Difficulty of Paying Living Expenses: Not hard at all  Food Insecurity: No Food Insecurity (09/07/2021)   Hunger Vital Sign    Worried About Running Out of Food in the Last Year: Never true    Ran Out of Food in the Last Year: Never true  Transportation Needs: No Transportation Needs (09/07/2021)   PRAPARE - Hydrologist (Medical): No    Lack of Transportation (Non-Medical): No  Physical Activity: Inactive (09/07/2021)   Exercise Vital Sign    Days of Exercise per Week: 0 days    Minutes of Exercise per Session: 0 min  Stress: No Stress Concern Present (09/07/2021)   [place]    Feeling of Stress : Not at all  Social Connections: Socially Isolated (09/07/2021)   Social Connection and Isolation Panel [NHANES]    Frequency of Communication with Friends and Family: More than three times a week    Frequency of Social Gatherings with Friends and Family: More than three times a week    Attends Religious Services: Never    Marine scientist or Organizations: No    Attends Archivist Meetings: Never    Marital Status: Divorced  Human resources officer Violence: Not At Risk (09/07/2021)   Humiliation, Afraid, Rape, and Kick questionnaire    Fear of Current or Ex-Partner: No    Emotionally Abused: No    Physically Abused: No    Sexually Abused: No    Outpatient Medications Prior to  Visit  Medication Sig Dispense Refill   cyanocobalamin (,VITAMIN B-12,) 1000 MCG/ML injection Inject 1,000 mcg into the muscle every 30 (thirty) days.     cyclobenzaprine (FLEXERIL) 5 MG tablet Take 1 tablet (5 mg total) by mouth 3 (three) times daily as needed for muscle spasms. 30 tablet 0   furosemide (LASIX) 20 MG tablet TAKE 1 TABLET BY MOUTH EVERY DAY FOR UP TO 7 DAYS THEN AS NEEDED FOR LEG EDEMA 90 tablet 1   glucose blood (ONETOUCH VERIO) test strip Monitor blood sugars twice daily 100 each 5   KLOR-CON M10 10 MEQ tablet TAKE 1 TABLET BY MOUTH  TWICE A DAY TAKE ALONG WITH FUROSEMIDE ONLY 180 tablet 1   Lancets (ONETOUCH ULTRASOFT) lancets Monitor blood sugars twice daily 100 each 5   lidocaine-prilocaine (EMLA) cream APPLY 2 GRAMS TO PORT-A-CATH SITE 30-60 MINUTES PRIOR TO PORT ACCESS AS NEEDED 30 g 1   metoprolol succinate (TOPROL-XL) 25 MG 24 hr tablet Take 1 tablet (25 mg total) by mouth daily. 90 tablet 1   naloxone (NARCAN) nasal spray 4 mg/0.1 mL Place 1 spray into the nose once as needed (overdose). 2 each 0   ondansetron (ZOFRAN-ODT) 4 MG disintegrating tablet Take 1 tablet (4 mg total) by mouth every 8 (eight) hours as needed for nausea or vomiting. 20 tablet 0   Oxycodone HCl 10 MG TABS Take 1 tablet (10 mg total) by mouth 2 (two) times daily as needed. 1 tab every 4-6 hr prn for chronic pain 60 tablet 0   Oxycodone HCl 10 MG TABS Take 1 tablet (10 mg total) by mouth 2 (two) times daily as needed. 1 tab every 4-6 hours as needed for pain 60 tablet 0   Oxycodone HCl 10 MG TABS Take 1 tablet (10 mg total) by mouth 2 (two) times daily as needed. 60 tablet 0   polyethylene glycol powder (GLYCOLAX/MIRALAX) 17 GM/SCOOP powder Dissolve 1 capful (17 g) in water and drink 2 (two) times daily. (Patient taking differently: Take 17 g by mouth daily.) 510 g 2   pregabalin (LYRICA) 150 MG capsule Take 1 capsule (150 mg total) by mouth 2 (two) times daily. 180 capsule 1   senna-docusate  (SENOKOT-S) 8.6-50 MG tablet Take 2 tablets by mouth at bedtime as needed for mild constipation. 60 tablet 1   sitaGLIPtin-metformin (JANUMET) 50-1000 MG tablet Take 1 tablet by mouth at bedtime. 90 tablet 1   Facility-Administered Medications Prior to Visit  Medication Dose Route Frequency Provider Last Rate Last Admin   0.9 %  sodium chloride infusion (Manually program via Guardrails IV Fluids)  250 mL Intravenous Once Truitt Merle, MD        Allergies  Allergen Reactions   Diona Fanti [Aspirin] Other (See Comments)    Contraindicated d/t low plts   Nsaids Other (See Comments)    Contraindicated d/t low plts   Red Blood Cells Nausea Only and Other (See Comments)    Near end of transfusion pt developed nausea, facial flushing, and tachycardia. See progress note from 03/01/22    Review of Systems  Constitutional:  Positive for malaise/fatigue. Negative for chills and fever.  HENT:  Positive for congestion (Chest). Negative for hearing loss.   Eyes:  Negative for discharge.  Respiratory:  Positive for cough (Productive) and sputum production. Negative for shortness of breath.   Cardiovascular:  Negative for chest pain, palpitations and leg swelling.  Gastrointestinal:  Negative for abdominal pain, blood in stool, constipation, diarrhea, heartburn, nausea and vomiting.  Genitourinary:  Negative for dysuria, frequency, hematuria and urgency.  Musculoskeletal:  Negative for back pain, falls and myalgias.       (+) Chest Tightness  Skin:  Negative for rash.  Neurological:  Positive for headaches. Negative for dizziness, sensory change, loss of consciousness and weakness.  Endo/Heme/Allergies:  Negative for environmental allergies. Does not bruise/bleed easily.  Psychiatric/Behavioral:  Negative for depression and suicidal ideas. The patient is not nervous/anxious and does not have insomnia.        Objective:    Physical Exam Vitals and nursing note reviewed.  Pulmonary:     Effort: Pulmonary  effort  is normal.     There were no vitals taken for this visit. Wt Readings from Last 3 Encounters:  04/25/22 151 lb 1.6 oz (68.5 kg)  04/04/22 144 lb 9.6 oz (65.6 kg)  03/28/22 147 lb 9.6 oz (67 kg)    Diabetic Foot Exam - Simple   No data filed    Lab Results  Component Value Date   WBC 3.6 (L) 04/25/2022   HGB 8.3 (L) 04/25/2022   HCT 26.6 (L) 04/25/2022   PLT 78 (L) 04/25/2022   GLUCOSE 126 (H) 04/25/2022   CHOL 134 11/07/2017   TRIG 171.0 (H) 11/07/2017   HDL 29.40 (L) 11/07/2017   LDLDIRECT 93.0 05/25/2016   LDLCALC 70 11/07/2017   ALT 18 04/25/2022   AST 19 04/25/2022   NA 139 04/25/2022   K 4.2 04/25/2022   CL 107 04/25/2022   CREATININE 0.59 04/25/2022   BUN 12 04/25/2022   CO2 27 04/25/2022   TSH 3.17 10/28/2020   INR 1.4 (H) 05/31/2021   HGBA1C 7.4 (A) 04/04/2022   HGBA1C 7.4 04/04/2022   HGBA1C 7.4 (A) 04/04/2022   HGBA1C 7.4 (A) 04/04/2022   MICROALBUR 0.8 04/04/2022    Lab Results  Component Value Date   TSH 3.17 10/28/2020   Lab Results  Component Value Date   WBC 3.6 (L) 04/25/2022   HGB 8.3 (L) 04/25/2022   HCT 26.6 (L) 04/25/2022   MCV 82.9 04/25/2022   PLT 78 (L) 04/25/2022   Lab Results  Component Value Date   NA 139 04/25/2022   K 4.2 04/25/2022   CO2 27 04/25/2022   GLUCOSE 126 (H) 04/25/2022   BUN 12 04/25/2022   CREATININE 0.59 04/25/2022   BILITOT 1.1 04/25/2022   ALKPHOS 116 04/25/2022   AST 19 04/25/2022   ALT 18 04/25/2022   PROT 6.9 04/25/2022   ALBUMIN 3.6 04/25/2022   CALCIUM 8.4 (L) 04/25/2022   ANIONGAP 5 04/25/2022   GFR 78.85 10/28/2020   Lab Results  Component Value Date   CHOL 134 11/07/2017   Lab Results  Component Value Date   HDL 29.40 (L) 11/07/2017   Lab Results  Component Value Date   LDLCALC 70 11/07/2017   Lab Results  Component Value Date   TRIG 171.0 (H) 11/07/2017   Lab Results  Component Value Date   CHOLHDL 5 11/07/2017   Lab Results  Component Value Date   HGBA1C 7.4 (A)  04/04/2022   HGBA1C 7.4 04/04/2022   HGBA1C 7.4 (A) 04/04/2022   HGBA1C 7.4 (A) 04/04/2022       Assessment & Plan:   Problem List Items Addressed This Visit   None Visit Diagnoses     Acute bronchitis due to COVID-19 virus    -  Primary   Relevant Medications   molnupiravir EUA (LAGEVRIO) 200 mg CAPS capsule   promethazine-dextromethorphan (PROMETHAZINE-DM) 6.25-15 MG/5ML syrup   azithromycin (ZITHROMAX Z-PAK) 250 MG tablet      Meds ordered this encounter  Medications   molnupiravir EUA (LAGEVRIO) 200 mg CAPS capsule    Sig: Take 4 capsules (800 mg total) by mouth 2 (two) times daily for 5 days.    Dispense:  40 capsule    Refill:  0   promethazine-dextromethorphan (PROMETHAZINE-DM) 6.25-15 MG/5ML syrup    Sig: Take 5 mLs by mouth 4 (four) times daily as needed.    Dispense:  118 mL    Refill:  0   azithromycin (ZITHROMAX Z-PAK) 250 MG tablet  Sig: As directed    Dispense:  6 each    Refill:  0   I discussed the assessment and treatment plan with the patient. The patient was provided an opportunity to ask questions and all were answered. The patient agreed with the plan and demonstrated an understanding of the instructions.   The patient was advised to call back or seek an in-person evaluation if the symptoms worsen or if the condition fails to improve as anticipated.  I provided 20 minutes of non-face-to-face time during this encounter.   I,Amber Collins,acting as a Education administrator for [employer], DO.,have documented all relevant documentation on the behalf of Ann Held, DO,as directed by  Ann Held, DO while in the presence of Ann Held, DO.   Ann Held, DO [place] [facility] [phone] (phone) [phone] (fax)  [place]

## 2022-05-16 NOTE — Patient Outreach (Signed)
  Care Coordination TOC Note Transition Care Management Unsuccessful Follow-up Telephone Call  Date of discharge and from where:  05/14/22-[facility]  Attempts:  1st Attempt  Reason for unsuccessful TCM follow-up call:  No answer/busy    Enzo Montgomery, RN,BSN,CCM [facility] Telephonic Care Management Coordinator Direct Phone: [phone] Toll Free: [phone] Fax: [phone]

## 2022-05-16 NOTE — Assessment & Plan Note (Signed)
Z pak  Antiviral and cough med Pt daughter is getting her a pulse oximeter---- pt instructed to periodically check and make sure PO 95% or higher  If symptoms worsen--- go to ER

## 2022-05-17 ENCOUNTER — Telehealth: Payer: Self-pay

## 2022-05-17 NOTE — Patient Outreach (Signed)
  Care Coordination TOC Note Transition Care Management Follow-up Telephone Call Date of discharge and from where: 05/14/22-[facility]   Dx: "Covid-19 infection" Red on EMMI-ED Discharge Alert Reason: "Do you have discharge instructions? No" Red Alert Date: 05/15/22 How have you been since you were released from the hospital? Patient reports that she slept well last night and believes it is due to the new meds that she was started on yesterday. She reports she completed virtual visit with PCP and was started on Z-pak and cough med. She feels like sxs are improving. She voices that today is her last day in "quarantine" and she will be able to come out of her bedroom.  Any questions or concerns? No  Items Reviewed: Did the pt receive and understand the discharge instructions provided?  Patient did not get any- LWBS Medications obtained and verified? Yes  Other? Yes  Any new allergies since your discharge? No  Dietary orders reviewed? Yes Do you have support at home? Yes   Home Care and Equipment/Supplies: Were home health services ordered? not applicable If so, what is the name of the agency? N/A  Has the agency set up a time to come to the patient's home? not applicable Were any new equipment or medical supplies ordered?  No What is the name of the medical supply agency? N/A Were you able to get the supplies/equipment? not applicable Do you have any questions related to the use of the equipment or supplies? No  Functional Questionnaire: (I = Independent and D = Dependent) ADLs: I  Bathing/Dressing- I  Meal Prep- I  Eating- I  Maintaining continence- I  Transferring/Ambulation- I  Managing Meds- I  Follow up appointments reviewed:  PCP Hospital f/u appt confirmed? Yes  Patient completed telehealth/virtual visit with PCP on yesterday. [facility] f/u appt confirmed?  N/A  . Are transportation arrangements needed? No  If their condition worsens, is the pt aware to  call PCP or go to the Emergency Dept.? Yes Was the patient provided with contact information for the PCP's office or ED? Yes Was to pt encouraged to call back with questions or concerns? Yes  SDOH assessments and interventions completed:   Yes SDOH Interventions Today    Flowsheet Row Most Recent Value  SDOH Interventions   Food Insecurity Interventions Intervention Not Indicated  Transportation Interventions Intervention Not Indicated       Care Coordination Interventions:  Education provided    Encounter Outcome:  Pt. Visit Completed    Enzo Montgomery, RN,BSN,CCM [facility] Telephonic Care Management Coordinator Direct Phone: [phone] Toll Free: [phone] Fax: [phone]

## 2022-05-20 MED FILL — Dexamethasone Sodium Phosphate Inj 100 MG/10ML: INTRAMUSCULAR | Qty: 1 | Status: AC

## 2022-05-20 NOTE — Progress Notes (Signed)
[facility]   [phone] [phone]   Clinic Follow up Note   Patient Care Team: Natalia Leatherwood, DO as PCP - General (Family Medicine) Myrtie Neither Andreas Blower, MD as Consulting Physician (Gastroenterology) Malachy Mood, MD as Consulting Physician (Hematology) Glendale Chard, DO as Consulting Physician (Neurology)  Date of Service:  05/23/2022  CHIEF COMPLAINT: f/u of [name], severe thrombocytopenia     CURRENT THERAPY:  Azacitidine days 1-5 q28 days, started 12/28/20 Nplate 27mcg/kg weekly Platelet transfusion as needed (plt<15K), blood transfusion if Hg<8.0  ASSESSMENT:  Kathleen Gibson is a 71 y.o. female with   [name] (chronic myelomonocytic leukemia) (HCC) -diagnosed in 10/2020, presented with severe thrombocytopenia  --She began azacitidine daily days 1-5 q. 28 days on 12/28/20 -She had worsening thrombocytopenia after stopping Nplate, restarted  -Bone marrow biopsy 03/18/21 showed stable disease, no increase in blasts. -she is tolerating treatment well, blood counts are stable   Recent COVID infection -diagnosed 13 days ago, received Molnupiravir  -she has recovered well   PLAN: -Lab reviewed, will proceed Vidaza infusion today in the next 4 days -Continue lab and Nplate injections weekly -Follow-up in 4 weeks before next cycle of Vidaza   SUMMARY OF ONCOLOGIC HISTORY: Oncology History  [name] (chronic myelomonocytic leukemia) (HCC)  10/29/2020 Imaging   CT CAP  IMPRESSION: 1. Splenomegaly with pathologically enlarged lymph nodes above and below the diaphragm, with overall stable to minimally increased abdominal adenopathy and interval progression of the pelvic adenopathy. 2. Small volume abdominopelvic ascites with diffuse mesenteric stranding. 3. Scattered bilateral pulmonary micro nodules measuring 1-2 mm. 4. Distended gallbladder with some layering hyperdense material representing layering sludge and tiny stones seen on  prior ultrasound. 5. Aortic atherosclerosis.   10/30/2020 Pathology Results   DIAGNOSIS:   BONE MARROW, ASPIRATE, CLOT, CORE:  -  Hypercellular bone marrow with panhyperplasia, atypia, and no  increase in blasts  -  See comment   PERIPHERAL BLOOD:  -  Marked thrombocytopenia  -  Absolute monocytosis  -  Normocytic anemia  -  See CBC data and comment   COMMENT:  The bone marrow is hypercellular for age (approximately 80%) with myeloid hyperplasia with maturational left shift, erythroid hyperplasia, and increased megakaryocytes.  Mild multilineage atypia is present. Blasts are not increased on aspirate smears (1% by manual differential count) or by CD34 immunohistochemistry on the core biopsy.  Concurrent flow cytometric analysis of the bone marrow aspirate demonstrates increased monocytes, and no increase in blasts or abnormal lymphoid population (see [id number]).  Monocytes are also increased in peripheral  blood, persistent per electronic medical record.  In aggregate, the  findings raise the possibility of a myeloid neoplasm with the  differential diagnosis including a low-grade myelodysplastic syndrome and chronic myelomonocytic leukemia (dysplastic type).    ADDENDUM:  A reticulin special stain performed on the bone marrow core biopsy reveals no significant increase in reticulin fibrosis.  ADDENDUM:  CYTOGENETIC RESULTS:  Karyotype: 46,XX[20]  Interpretation: NORMAL FEMALE KARYOTYPE   FISH RESULTS:  Results: NORMAL   ADDENDUM:  CD123 immunohistochemistry performed on the core biopsy highlights scattered aggregates of positively staining cells consistent with plasmacytoid dendritic cells.    10/30/2020 Pathology Results   DIAGNOSIS:   BONE MARROW; FLOW CYTOMETRIC ANALYSIS:  -  Increased monocytes  -  Scant B-cells present  -  No immunophenotypically aberrant T-cell population identified  -  No increase in blasts  -  See comment   COMMENT:  Monocytes are relatively  increased (12% of all cells), without aberrant expression of CD56.  B-cells comprise <1% of total lymphocytes. CD34-positive blasts are not increased (<1% of all cells).  Correlation with concurrent morphology is recommended for complete diagnostic interpretation and overall blast enumeration (see [id number]).    10/30/2020 Pathology Results   FINAL MICROSCOPIC DIAGNOSIS:   A. LYMPH NODE, RIGHT AXILLARY, NEEDLE CORE BIOPSY:  -Lymphoid tissue present  -See comment   COMMENT:  The sections show several small needle core biopsy fragments of lymphoid tissue displaying degenerative cellular changes/necrosis and hence cannot be accurately evaluated.  Sample for flow cytometric analysis not available.  Immunohistochemical stain for CD3 and CD20 were performed with appropriate controls.  There is a mixture of T and B cells in their apparently respective compartments.  There is no definite metastatic malignancy.    11/11/2020 Pathology Results   DIAGNOSIS:   LEFT AXILLARY LYMPH NODE EXCISIONAL BIOPSY; FLOW CYTOMETRIC ANALYSIS:  -  No monotypic B-cell or immunophenotypically aberrant T-cell  population identified  -  See comment   COMMENT:  Flow cytometric analysis identifies B-cells with a normal kappa:lambda ratio of 1.7:1.  A subset of the polytypic B-cells expresses CD10. T-cells show a CD4:CD8 ratio of 3.3:1 without immunophenotypic aberrancy with the markers evaluated.  Although these results do not support the diagnosis of a clonal lymphoid population, sampling issues must always be considered when negative results are obtained, as focal lesions may not be represented in the specimen submitted.  In addition, flow cytometric immunophenotyping will not exclude other pathology if present (e.g. Hodgkin lymphoma, some T-cell lymphomas, metastatic and infectious diseases).   11/11/2020 Pathology Results   FINAL MICROSCOPIC DIAGNOSIS:   A. LYMPH NODE, LEFT AXILLARY, DISSECTION:  -  Follicular hyperplasia  with interfollicular expansion  -  See comment   COMMENT:  Sections of the lymph nodes reveal generally preserved lymph node architecture.  There is follicular hyperplasia with some follicles showing increased hyalinization of germinal centers and mild concentric encircling of germinal centers with small lymphocytes (onion skinning). Interfollicular areas are expanded with increased vascular proliferation, small lymphocytes, plasma cells, histiocytic cells, and patchy neutrophils.  The lymph node capsule is variably thickened by fibrosis with few plasma cells.   A panel of immunohistochemical stains is performed for further  characterization.  CD3 and CD20/PAX5 highlight T-cell and B-cell  compartments, respectively.  Germinal centers are highlighted by CD10 and BCL6 with appropriate absent expression of BCL2.  CD5 is similar to CD3.  CD43 also stains the T-cells.  CD4-positive T-cells exceed CD8-positive T-cells.  CD30 highlights scattered immunoblasts.  CD21 reveals intact follicular dendritic cell meshworks.  CD68 highlights increased histiocytic cells.  HHV 8 is negative.  T. pallidum reveals no definitive organisms.  Pancytokeratin is negative.  TdT stains rare cells.  CD138 highlights plasma cells which are not increased in number and show polytypic light chain expression by kappa/lambda in situ  hybridization.  EBV is negative by in situ hybridization.   Concurrent flow cytometric analysis is negative for a monoclonal B-cell or immunophenotypically aberrant T-cell population (see [id number]).   Together, the findings above are non-specific and can be seen in  reactive and infectious processes as well as Castleman disease.  There is no definitive morphologic or flow cytometric evidence of involvement by a lymphoproliferative disorder from the current workup.   12/17/2020 Initial Diagnosis   [name] (chronic myelomonocytic leukemia) (HCC)   12/28/2020 - 02/03/2022 Chemotherapy   Patient is on  Treatment Plan : MYELODYSPLASIA  Azacitidine IV  D1-5 q28d     03/18/2021 Pathology Results   Final Diagnosis    BONE MARROW:             Persistent chronic myelomonocytic leukemia in a hypercellular bone marrow with increased monocytes and no increase in blasts.   Comment    Flow cytometric analysis showed no increase in blasts and 17% monocytes.A next generation myeloid panel showed the presence of the following mutations: NRAS, SH2B3, PHF6 and TET2. CD34 and CD117 immunstains do not show an increase in blasts. The findings are consistent with persistent [name] with a decrease in the number of blasts compared to that seen on the previous bone marrow.    Final Interpretation      BONE MARROW; FLOW CYTOMETRIC ANALYSIS:   No increased blasts identified. (see comment)  Monocytosis (17% of total events).    COMMENT: Flow cytometry identified about 0.6% of total events as immature cells. These cells express CD45 (dim), CD34, CD117, HLA-DR, CD38, CD33 (variable). This immunophenotype is consistent with normal myeloblasts. In addition, monocytes are increased and comprise of approximately 17% of total events. These monocytes show normal expression of CD33/CD64/CD14/CD11b with variable expression of CD4. Correlation with concurrent morphology and clinical data is recommended (see [id number]).    FLOW CYTOMETRY ANALYSIS: CD45 versus side scatter analysis demonstrate a predominance of granulocytes (~70%), monocytes (~17%) and lymphocytes (~6%). No significant increase in blasts identified. All tested markers were used for adequate analysis of the cells and were appropriately reviewed.     12/28/2021 -  Chemotherapy   Patient is on Treatment Plan : MYELODYSPLASIA  Azacitidine IV D1-5 q28d        INTERVAL HISTORY:  TASHANTI WIERMAN is here for a follow up of  [name], severe thrombocytopenia  She was last seen by me on 04/25/2022 She presents to the clinic alone.  I saw her in the infusion room with isolation  precaution due to her recent COVID infection.  She has recovered well from COVID, with minimal residual dry cough.  Still have some fatigue, able to function well.  No other new complaints.   All other systems were reviewed with the patient and are negative.  MEDICAL HISTORY:  Past Medical History:  Diagnosis Date   Diabetes (HCC)    HLD (hyperlipidemia)    Hypertension    Pernicious anemia    Pneumonia 09/05/2019   Renal insufficiency 09/06/2019   Vitamin D deficiency     SURGICAL HISTORY: Past Surgical History:  Procedure Laterality Date   ABDOMINAL HERNIA REPAIR  2009   ABDOMINOPLASTY     AXILLARY LYMPH NODE BIOPSY Left 11/11/2020   Procedure: LEFT AXILLARY EXCISIONAL LYMPH NODE BIOPSY;  Surgeon: Darnell Level, MD;  Location: [facility] OR;  Service: General;  Laterality: Left;   CESAREAN SECTION  1978   GASTRIC BYPASS  2000   hemorroid surgery  2007   IR IMAGING GUIDED PORT INSERTION  07/26/2021   IR KYPHO EA ADDL LEVEL THORACIC OR LUMBAR  05/31/2021   IR KYPHO THORACIC WITH BONE BIOPSY  05/31/2021   TONSILLECTOMY AND ADENOIDECTOMY  1957    I have reviewed the social history and family history with the patient and they are unchanged from previous note.  ALLERGIES:  is allergic to asa [aspirin], nsaids, and red blood cells.  MEDICATIONS:  Current Outpatient Medications  Medication Sig Dispense Refill   cyanocobalamin (,VITAMIN B-12,) 1000 MCG/ML injection Inject 1,000 mcg into the muscle every 30 (thirty) days.     cyclobenzaprine (FLEXERIL)  5 MG tablet Take 1 tablet (5 mg total) by mouth 3 (three) times daily as needed for muscle spasms. 30 tablet 0   furosemide (LASIX) 20 MG tablet TAKE 1 TABLET BY MOUTH EVERY DAY FOR UP TO 7 DAYS THEN AS NEEDED FOR LEG EDEMA 90 tablet 1   glucose blood (ONETOUCH VERIO) test strip Monitor blood sugars twice daily 100 each 5   KLOR-CON M10 10 MEQ tablet TAKE 1 TABLET BY MOUTH TWICE A DAY TAKE ALONG WITH FUROSEMIDE ONLY 180 tablet 1   Lancets  (ONETOUCH ULTRASOFT) lancets Monitor blood sugars twice daily 100 each 5   lidocaine-prilocaine (EMLA) cream APPLY 2 GRAMS TO PORT-A-CATH SITE 30-60 MINUTES PRIOR TO PORT ACCESS AS NEEDED 30 g 1   metoprolol succinate (TOPROL-XL) 25 MG 24 hr tablet Take 1 tablet (25 mg total) by mouth daily. 90 tablet 1   naloxone (NARCAN) nasal spray 4 mg/0.1 mL Place 1 spray into the nose once as needed (overdose). 2 each 0   ondansetron (ZOFRAN-ODT) 4 MG disintegrating tablet Take 1 tablet (4 mg total) by mouth every 8 (eight) hours as needed for nausea or vomiting. 20 tablet 0   Oxycodone HCl 10 MG TABS Take 1 tablet (10 mg total) by mouth 2 (two) times daily as needed. 60 tablet 0   polyethylene glycol powder (GLYCOLAX/MIRALAX) 17 GM/SCOOP powder Dissolve 1 capful (17 g) in water and drink 2 (two) times daily. (Patient taking differently: Take 17 g by mouth daily.) 510 g 2   pregabalin (LYRICA) 150 MG capsule Take 1 capsule (150 mg total) by mouth 2 (two) times daily. 180 capsule 1   promethazine-dextromethorphan (PROMETHAZINE-DM) 6.25-15 MG/5ML syrup Take 5 mLs by mouth 4 (four) times daily as needed. 118 mL 0   senna-docusate (SENOKOT-S) 8.6-50 MG tablet Take 2 tablets by mouth at bedtime as needed for mild constipation. 60 tablet 1   sitaGLIPtin-metformin (JANUMET) 50-1000 MG tablet Take 1 tablet by mouth at bedtime. 90 tablet 1   No current facility-administered medications for this visit.   Facility-Administered Medications Ordered in Other Visits  Medication Dose Route Frequency Provider Last Rate Last Admin   0.9 %  sodium chloride infusion (Manually program via Guardrails IV Fluids)  250 mL Intravenous Once Malachy Mood, MD       0.9 %  sodium chloride infusion   Intravenous Once Malachy Mood, MD       alteplase (CATHFLO ACTIVASE) injection 2 mg  2 mg Intracatheter Once PRN Malachy Mood, MD       cyanocobalamin (VITAMIN B12) injection 1,000 mcg  1,000 mcg Intramuscular Once Malachy Mood, MD       heparin lock  flush 100 unit/mL  500 Units Intracatheter Once PRN Malachy Mood, MD       heparin lock flush 100 unit/mL  250 Units Intracatheter Once PRN Malachy Mood, MD       sodium chloride flush (NS) 0.9 % injection 10 mL  10 mL Intracatheter PRN Malachy Mood, MD   10 mL at 05/23/22 1415   sodium chloride flush (NS) 0.9 % injection 3 mL  3 mL Intracatheter Once PRN Malachy Mood, MD        PHYSICAL EXAMINATION: ECOG PERFORMANCE STATUS: 1 - Symptomatic but completely ambulatory  There were no vitals filed for this visit. Wt Readings from Last 3 Encounters:  04/25/22 151 lb 1.6 oz (68.5 kg)  04/04/22 144 lb 9.6 oz (65.6 kg)  03/28/22 147 lb 9.6 oz (67 kg)  GENERAL:alert, no distress and comfortable SKIN: skin color, texture, turgor are normal, no rashes or significant lesions EYES: normal, Conjunctiva are pink and non-injected, sclera clear Musculoskeletal:no cyanosis of digits and no clubbing  NEURO: alert & oriented x 3 with fluent speech, no focal motor/sensory deficits  LABORATORY DATA:  I have reviewed the data as listed    Latest Ref Rng & Units 05/23/2022   10:46 AM 04/25/2022    8:45 AM 04/18/2022    8:11 AM  CBC  WBC 4.0 - 10.5 K/uL 2.0  3.6  5.0   Hemoglobin 12.0 - 15.0 g/dL 7.7  8.3  8.2   Hematocrit 36.0 - 46.0 % 24.5  26.6  26.0   Platelets 150 - 400 K/uL 124  78  38         Latest Ref Rng & Units 05/23/2022   10:46 AM 04/25/2022    8:45 AM 04/18/2022    8:11 AM  CMP  Glucose 70 - 99 mg/dL 829  562  130   BUN 8 - 23 mg/dL 12  12  16    Creatinine 0.44 - 1.00 mg/dL 8.65  7.84  6.96   Sodium 135 - 145 mmol/L 141  139  139   Potassium 3.5 - 5.1 mmol/L 3.8  4.2  3.8   Chloride 98 - 111 mmol/L 110  107  106   CO2 22 - 32 mmol/L 27  27  26    Calcium 8.9 - 10.3 mg/dL 8.2  8.4  8.4   Total Protein 6.5 - 8.1 g/dL 5.9  6.9    Total Bilirubin 0.3 - 1.2 mg/dL 0.8  1.1    Alkaline Phos 38 - 126 U/L 118  116    AST 15 - 41 U/L 14  19    ALT 0 - 44 U/L 12  18        RADIOGRAPHIC  STUDIES: I have personally reviewed the radiological images as listed and agreed with the findings in the report. No results found.    Orders Placed This Encounter  Procedures   CBC with Differential (Cancer Center Only)    Standing Status:   Future    Standing Expiration Date:   06/21/2023   Basic Metabolic Panel - Cancer Center Only    Standing Status:   Future    Standing Expiration Date:   06/21/2023   All questions were answered. The patient knows to call the clinic with any problems, questions or concerns. No barriers to learning was detected. The total time spent in the appointment was 30 minutes.     Malachy Mood, MD 05/23/2022   Carolin Coy, CMA, am acting as scribe for Malachy Mood, MD.   I have reviewed the above documentation for accuracy and completeness, and I agree with the above.

## 2022-05-22 NOTE — Assessment & Plan Note (Addendum)
-  diagnosed in 10/2020, presented with severe thrombocytopenia  --She began azacitidine daily days 1-5 q. 28 days on 12/28/20 -She had worsening thrombocytopenia after stopping Nplate, restarted  -Bone marrow biopsy 03/18/21 showed stable disease, no increase in blasts. -she is tolerating treatment well, blood counts are stable

## 2022-05-23 ENCOUNTER — Inpatient Hospital Stay: Payer: Medicare HMO

## 2022-05-23 ENCOUNTER — Other Ambulatory Visit: Payer: Self-pay | Admitting: *Deleted

## 2022-05-23 ENCOUNTER — Encounter: Payer: Self-pay | Admitting: Hematology

## 2022-05-23 ENCOUNTER — Other Ambulatory Visit: Payer: Self-pay

## 2022-05-23 ENCOUNTER — Inpatient Hospital Stay (HOSPITAL_BASED_OUTPATIENT_CLINIC_OR_DEPARTMENT_OTHER): Payer: Medicare HMO | Admitting: Hematology

## 2022-05-23 VITALS — BP 108/58 | HR 81 | Temp 99.2°F | Resp 18

## 2022-05-23 DIAGNOSIS — C9310 Chronic myelomonocytic leukemia not having achieved remission: Secondary | ICD-10-CM | POA: Diagnosis not present

## 2022-05-23 DIAGNOSIS — E785 Hyperlipidemia, unspecified: Secondary | ICD-10-CM | POA: Diagnosis not present

## 2022-05-23 DIAGNOSIS — Z7984 Long term (current) use of oral hypoglycemic drugs: Secondary | ICD-10-CM | POA: Diagnosis not present

## 2022-05-23 DIAGNOSIS — E1142 Type 2 diabetes mellitus with diabetic polyneuropathy: Secondary | ICD-10-CM

## 2022-05-23 DIAGNOSIS — E119 Type 2 diabetes mellitus without complications: Secondary | ICD-10-CM | POA: Diagnosis not present

## 2022-05-23 DIAGNOSIS — Z9884 Bariatric surgery status: Secondary | ICD-10-CM | POA: Diagnosis not present

## 2022-05-23 DIAGNOSIS — Z79899 Other long term (current) drug therapy: Secondary | ICD-10-CM | POA: Diagnosis not present

## 2022-05-23 DIAGNOSIS — I10 Essential (primary) hypertension: Secondary | ICD-10-CM | POA: Diagnosis not present

## 2022-05-23 DIAGNOSIS — Z95828 Presence of other vascular implants and grafts: Secondary | ICD-10-CM

## 2022-05-23 DIAGNOSIS — D696 Thrombocytopenia, unspecified: Secondary | ICD-10-CM

## 2022-05-23 DIAGNOSIS — Z5111 Encounter for antineoplastic chemotherapy: Secondary | ICD-10-CM | POA: Diagnosis present

## 2022-05-23 DIAGNOSIS — D693 Immune thrombocytopenic purpura: Secondary | ICD-10-CM | POA: Diagnosis present

## 2022-05-23 LAB — COMPREHENSIVE METABOLIC PANEL
ALT: 12 U/L (ref 0–44)
AST: 14 U/L — ABNORMAL LOW (ref 15–41)
Albumin: 3 g/dL — ABNORMAL LOW (ref 3.5–5.0)
Alkaline Phosphatase: 118 U/L (ref 38–126)
Anion gap: 4 — ABNORMAL LOW (ref 5–15)
BUN: 12 mg/dL (ref 8–23)
CO2: 27 mmol/L (ref 22–32)
Calcium: 8.2 mg/dL — ABNORMAL LOW (ref 8.9–10.3)
Chloride: 110 mmol/L (ref 98–111)
Creatinine, Ser: 0.58 mg/dL (ref 0.44–1.00)
GFR, Estimated: >60 mL/min (ref >60)
Glucose, Bld: 145 mg/dL — ABNORMAL HIGH (ref 70–99)
Potassium: 3.8 mmol/L (ref 3.5–5.1)
Sodium: 141 mmol/L (ref 135–145)
Total Bilirubin: 0.8 mg/dL (ref 0.3–1.2)
Total Protein: 5.9 g/dL — ABNORMAL LOW (ref 6.5–8.1)

## 2022-05-23 LAB — SAMPLE TO BLOOD BANK

## 2022-05-23 LAB — CBC WITH DIFFERENTIAL (CANCER CENTER ONLY)
Abs Immature Granulocytes: 0.02 10*3/uL (ref 0.00–0.07)
Basophils Absolute: 0 10*3/uL (ref 0.0–0.1)
Basophils Relative: 1 %
Eosinophils Absolute: 0 10*3/uL (ref 0.0–0.5)
Eosinophils Relative: 1 %
HCT: 24.5 % — ABNORMAL LOW (ref 36.0–46.0)
Hemoglobin: 7.7 g/dL — ABNORMAL LOW (ref 12.0–15.0)
Immature Granulocytes: 1 %
Lymphocytes Relative: 22 %
Lymphs Abs: 0.5 10*3/uL — ABNORMAL LOW (ref 0.7–4.0)
MCH: 26 pg (ref 26.0–34.0)
MCHC: 31.4 g/dL (ref 30.0–36.0)
MCV: 82.8 fL (ref 80.0–100.0)
Monocytes Absolute: 0.5 10*3/uL (ref 0.1–1.0)
Monocytes Relative: 23 %
Neutro Abs: 1.1 10*3/uL — ABNORMAL LOW (ref 1.7–7.7)
Neutrophils Relative %: 52 %
Platelet Count: 124 10*3/uL — ABNORMAL LOW (ref 150–400)
RBC: 2.96 MIL/uL — ABNORMAL LOW (ref 3.87–5.11)
RDW: 23.9 % — ABNORMAL HIGH (ref 11.5–15.5)
WBC Count: 2 10*3/uL — ABNORMAL LOW (ref 4.0–10.5)
nRBC: 0 % (ref 0.0–0.2)

## 2022-05-23 LAB — PREPARE RBC (CROSSMATCH)

## 2022-05-23 MED ORDER — HEPARIN SOD (PORK) LOCK FLUSH 100 UNIT/ML IV SOLN
500.0000 [IU] | Freq: Once | INTRAVENOUS | Status: AC | PRN
  Administered 2022-05-23: 500 [IU]

## 2022-05-23 MED ORDER — HEPARIN SOD (PORK) LOCK FLUSH 100 UNIT/ML IV SOLN: 250.0000 [IU] | Freq: Once | INTRAVENOUS | Status: DC | PRN

## 2022-05-23 MED ORDER — SODIUM CHLORIDE 0.9 % IV SOLN: Freq: Once | INTRAVENOUS | Status: DC

## 2022-05-23 MED ORDER — HEPARIN SOD (PORK) LOCK FLUSH 100 UNIT/ML IV SOLN: 500.0000 [IU] | Freq: Once | INTRAVENOUS | Status: DC | PRN

## 2022-05-23 MED ORDER — SODIUM CHLORIDE 0.9% FLUSH
10.0000 mL | INTRAVENOUS | Status: DC | PRN
  Administered 2022-05-23: 10 mL

## 2022-05-23 MED ORDER — ZOLEDRONIC ACID 4 MG/100ML IV SOLN: 4.0000 mg | Freq: Once | INTRAVENOUS | Status: DC

## 2022-05-23 MED ORDER — ALTEPLASE 2 MG IJ SOLR: 2.0000 mg | Freq: Once | INTRAMUSCULAR | Status: DC | PRN

## 2022-05-23 MED ORDER — ROMIPLOSTIM INJECTION 500 MCG
480.0000 ug | Freq: Once | SUBCUTANEOUS | Status: AC
  Administered 2022-05-23: 480 ug via SUBCUTANEOUS
  Filled 2022-05-23: qty 0.96

## 2022-05-23 MED ORDER — SODIUM CHLORIDE 0.9 % IV SOLN
10.0000 mg | Freq: Once | INTRAVENOUS | Status: AC
  Administered 2022-05-23: 10 mg via INTRAVENOUS
  Filled 2022-05-23: qty 10

## 2022-05-23 MED ORDER — CYANOCOBALAMIN 1000 MCG/ML IJ SOLN
1000.0000 ug | Freq: Once | INTRAMUSCULAR | Status: DC
  Filled 2022-05-23: qty 1

## 2022-05-23 MED ORDER — SODIUM CHLORIDE 0.9 % IV SOLN: Freq: Once | INTRAVENOUS | Status: AC

## 2022-05-23 MED ORDER — SODIUM CHLORIDE 0.9% FLUSH: 3.0000 mL | Freq: Once | INTRAVENOUS | Status: DC | PRN

## 2022-05-23 MED ORDER — SODIUM CHLORIDE 0.9 % IV SOLN
75.0000 mg/m2 | Freq: Once | INTRAVENOUS | Status: AC
  Administered 2022-05-23: 130 mg via INTRAVENOUS
  Filled 2022-05-23: qty 13

## 2022-05-23 MED FILL — Dexamethasone Sodium Phosphate Inj 100 MG/10ML: INTRAMUSCULAR | Qty: 1 | Status: AC

## 2022-05-23 NOTE — Progress Notes (Signed)
Per Dr Burr Medico, labs reviewed and patient is ok to proceed with treatment today. Will be scheduled for 1 unit of blood.

## 2022-05-23 NOTE — Progress Notes (Signed)
Pt states that she received the following in [place]:  NPLATE 05/02/22, 44/69/50; B12 05/09/22

## 2022-05-24 ENCOUNTER — Inpatient Hospital Stay: Payer: Medicare HMO

## 2022-05-24 VITALS — BP 133/77 | HR 86 | Temp 99.1°F | Resp 18

## 2022-05-24 DIAGNOSIS — I10 Essential (primary) hypertension: Secondary | ICD-10-CM | POA: Diagnosis not present

## 2022-05-24 DIAGNOSIS — C9310 Chronic myelomonocytic leukemia not having achieved remission: Secondary | ICD-10-CM

## 2022-05-24 DIAGNOSIS — Z7984 Long term (current) use of oral hypoglycemic drugs: Secondary | ICD-10-CM | POA: Diagnosis not present

## 2022-05-24 DIAGNOSIS — D693 Immune thrombocytopenic purpura: Secondary | ICD-10-CM | POA: Diagnosis not present

## 2022-05-24 DIAGNOSIS — E785 Hyperlipidemia, unspecified: Secondary | ICD-10-CM | POA: Diagnosis not present

## 2022-05-24 DIAGNOSIS — Z5111 Encounter for antineoplastic chemotherapy: Secondary | ICD-10-CM | POA: Diagnosis not present

## 2022-05-24 DIAGNOSIS — E119 Type 2 diabetes mellitus without complications: Secondary | ICD-10-CM | POA: Diagnosis not present

## 2022-05-24 DIAGNOSIS — Z79899 Other long term (current) drug therapy: Secondary | ICD-10-CM | POA: Diagnosis not present

## 2022-05-24 DIAGNOSIS — Z9884 Bariatric surgery status: Secondary | ICD-10-CM | POA: Diagnosis not present

## 2022-05-24 MED ORDER — LORATADINE 10 MG PO TABS
10.0000 mg | ORAL_TABLET | Freq: Every day | ORAL | Status: DC
  Administered 2022-05-24: 10 mg via ORAL
  Filled 2022-05-24: qty 1

## 2022-05-24 MED ORDER — ACETAMINOPHEN 325 MG PO TABS
650.0000 mg | ORAL_TABLET | Freq: Once | ORAL | Status: AC
  Administered 2022-05-24: 650 mg via ORAL
  Filled 2022-05-24: qty 2

## 2022-05-24 MED ORDER — SODIUM CHLORIDE 0.9% IV SOLUTION
250.0000 mL | Freq: Once | INTRAVENOUS | Status: AC
  Administered 2022-05-24: 250 mL via INTRAVENOUS

## 2022-05-24 MED ORDER — SODIUM CHLORIDE 0.9 % IV SOLN: Freq: Once | INTRAVENOUS | Status: AC

## 2022-05-24 MED ORDER — HEPARIN SOD (PORK) LOCK FLUSH 100 UNIT/ML IV SOLN
500.0000 [IU] | Freq: Once | INTRAVENOUS | Status: AC | PRN
  Administered 2022-05-24: 500 [IU]

## 2022-05-24 MED ORDER — SODIUM CHLORIDE 0.9 % IV SOLN
10.0000 mg | Freq: Once | INTRAVENOUS | Status: AC
  Administered 2022-05-24: 10 mg via INTRAVENOUS
  Filled 2022-05-24: qty 10

## 2022-05-24 MED ORDER — SODIUM CHLORIDE 0.9% FLUSH
10.0000 mL | INTRAVENOUS | Status: DC | PRN
  Administered 2022-05-24: 10 mL

## 2022-05-24 MED ORDER — SODIUM CHLORIDE 0.9 % IV SOLN
75.0000 mg/m2 | Freq: Once | INTRAVENOUS | Status: AC
  Administered 2022-05-24: 130 mg via INTRAVENOUS
  Filled 2022-05-24: qty 13

## 2022-05-24 MED FILL — Dexamethasone Sodium Phosphate Inj 100 MG/10ML: INTRAMUSCULAR | Qty: 1 | Status: AC

## 2022-05-24 NOTE — Patient Instructions (Signed)
[facility]  Discharge Instructions: Thank you for choosing [facility] to provide your oncology and hematology care.   If you have a lab appointment with the [facility], please go directly to the [facility] and check in at the registration area.   Wear comfortable clothing and clothing appropriate for easy access to any Portacath or PICC line.   We strive to give you quality time with your provider. You may need to reschedule your appointment if you arrive late (15 or more minutes).  Arriving late affects you and other patients whose appointments are after yours.  Also, if you miss three or more appointments without notifying the office, you may be dismissed from the clinic at the provider's discretion.      For prescription refill requests, have your pharmacy contact our office and allow 72 hours for refills to be completed.    Today you received the following chemotherapy and/or immunotherapy agents: Azacitidine.      To help prevent nausea and vomiting after your treatment, we encourage you to take your nausea medication as directed.  BELOW ARE SYMPTOMS THAT SHOULD BE REPORTED IMMEDIATELY: *FEVER GREATER THAN 100.4 F (38 C) OR HIGHER *CHILLS OR SWEATING *NAUSEA AND VOMITING THAT IS NOT CONTROLLED WITH YOUR NAUSEA MEDICATION *UNUSUAL SHORTNESS OF BREATH *UNUSUAL BRUISING OR BLEEDING *URINARY PROBLEMS (pain or burning when urinating, or frequent urination) *BOWEL PROBLEMS (unusual diarrhea, constipation, pain near the anus) TENDERNESS IN MOUTH AND THROAT WITH OR WITHOUT PRESENCE OF ULCERS (sore throat, sores in mouth, or a toothache) UNUSUAL RASH, SWELLING OR PAIN  UNUSUAL VAGINAL DISCHARGE OR ITCHING   Items with * indicate a potential emergency and should be followed up as soon as possible or go to the Emergency Department if any problems should occur.  Please show the CHEMOTHERAPY ALERT CARD or IMMUNOTHERAPY ALERT CARD at check-in  to the Emergency Department and triage nurse.  Should you have questions after your visit or need to cancel or reschedule your appointment, please contact [facility]  Dept: [phone]  and follow the prompts.  Office hours are 8:00 a.m. to 4:30 p.m. Monday - Friday. Please note that voicemails left after 4:00 p.m. may not be returned until the following business day.  We are closed weekends and major holidays. You have access to a nurse at all times for urgent questions. Please call the main number to the clinic Dept: [phone] and follow the prompts.   For any non-urgent questions, you may also contact your provider using MyChart. We now offer e-Visits for anyone 33 and older to request care online for non-urgent symptoms. For details visit [email or web address].   Also download the MyChart app! Go to the app store, search "MyChart", open the app, select [facility], and log in with your MyChart username and password.  Masks are optional in the cancer centers. If you would like for your care team to wear a mask while they are taking care of you, please let them know. You may have one support person who is at least 70 years old accompany you for your appointments. Blood Transfusion, Adult, Care After After a blood transfusion, it is common to have: Bruising and soreness at the IV site. A headache. Follow these instructions at home: Your doctor may give you more instructions. If you have problems, contact your doctor. Insertion site care     Follow instructions from your doctor about how to take care of your insertion  site. This is where an IV tube was put into your vein. Make sure you: Wash your hands with soap and water for at least 20 seconds before and after you change your bandage. If you cannot use soap and water, use hand sanitizer. Change your bandage as told by your doctor. Check your insertion site every day for signs of infection. Check  for: Redness, swelling, or pain. Bleeding from the site. Warmth. Pus or a bad smell. General instructions Take over-the-counter and prescription medicines only as told by your doctor. Rest as told by your doctor. Go back to your normal activities as told by your doctor. Keep all follow-up visits. You may need to have tests at certain times to check your blood. Contact a doctor if: You have itching or red, swollen areas of skin (hives). You have a fever or chills. You have pain in the head, back, or chest. You feel worried or nervous (anxious). You feel weak after doing your normal activities. You have any of these problems at the insertion site: Redness, swelling, warmth, or pain. Bleeding that does not stop with pressure. Pus or a bad smell. If you received your blood transfusion in an outpatient setting, you will be told whom to contact to report any reactions. Get help right away if: You have signs of a serious reaction. This may be coming from an allergy or the body's defense system (immune system). Signs include: Trouble breathing or shortness of breath. Swelling of the face or feeling warm (flushed). A widespread rash. Dark pee (urine) or blood in the pee. Fast heartbeat. These symptoms may be an emergency. Get help right away. Call 911. Do not wait to see if the symptoms will go away. Do not drive yourself to the hospital. Summary Bruising and soreness at the IV site are common. Check your insertion site every day for signs of infection. Rest as told by your doctor. Go back to your normal activities as told by your doctor. Get help right away if you have signs of a serious reaction. This information is not intended to replace advice given to you by your health care provider. Make sure you discuss any questions you have with your health care provider. Document Revised: 08/27/2021 Document Reviewed: 08/27/2021 Elsevier Patient Education  [place].

## 2022-05-24 NOTE — Progress Notes (Signed)
Per Dr. Burr Medico ok to proceed with elevated HR.

## 2022-05-25 ENCOUNTER — Inpatient Hospital Stay: Payer: Medicare HMO

## 2022-05-25 VITALS — BP 128/72 | HR 84 | Temp 98.1°F | Resp 17

## 2022-05-25 DIAGNOSIS — Z9884 Bariatric surgery status: Secondary | ICD-10-CM | POA: Diagnosis not present

## 2022-05-25 DIAGNOSIS — Z79899 Other long term (current) drug therapy: Secondary | ICD-10-CM | POA: Diagnosis not present

## 2022-05-25 DIAGNOSIS — Z7984 Long term (current) use of oral hypoglycemic drugs: Secondary | ICD-10-CM | POA: Diagnosis not present

## 2022-05-25 DIAGNOSIS — E119 Type 2 diabetes mellitus without complications: Secondary | ICD-10-CM | POA: Diagnosis not present

## 2022-05-25 DIAGNOSIS — C9310 Chronic myelomonocytic leukemia not having achieved remission: Secondary | ICD-10-CM | POA: Diagnosis not present

## 2022-05-25 DIAGNOSIS — Z5111 Encounter for antineoplastic chemotherapy: Secondary | ICD-10-CM | POA: Diagnosis not present

## 2022-05-25 DIAGNOSIS — E785 Hyperlipidemia, unspecified: Secondary | ICD-10-CM | POA: Diagnosis not present

## 2022-05-25 DIAGNOSIS — I10 Essential (primary) hypertension: Secondary | ICD-10-CM | POA: Diagnosis not present

## 2022-05-25 DIAGNOSIS — D693 Immune thrombocytopenic purpura: Secondary | ICD-10-CM | POA: Diagnosis not present

## 2022-05-25 LAB — TYPE AND SCREEN
ABO/RH(D): O POS
Antibody Screen: NEGATIVE
Unit division: 0

## 2022-05-25 LAB — BPAM RBC
Blood Product Expiration Date: 202401092359
ISSUE DATE / TIME: 202312121431
Unit Type and Rh: 5100

## 2022-05-25 MED ORDER — SODIUM CHLORIDE 0.9 % IV SOLN
10.0000 mg | Freq: Once | INTRAVENOUS | Status: AC
  Administered 2022-05-25: 10 mg via INTRAVENOUS
  Filled 2022-05-25: qty 10

## 2022-05-25 MED ORDER — SODIUM CHLORIDE 0.9% FLUSH
10.0000 mL | INTRAVENOUS | Status: DC | PRN
  Administered 2022-05-25: 10 mL

## 2022-05-25 MED ORDER — SODIUM CHLORIDE 0.9 % IV SOLN: Freq: Once | INTRAVENOUS | Status: AC

## 2022-05-25 MED ORDER — SODIUM CHLORIDE 0.9 % IV SOLN
75.0000 mg/m2 | Freq: Once | INTRAVENOUS | Status: AC
  Administered 2022-05-25: 130 mg via INTRAVENOUS
  Filled 2022-05-25: qty 13

## 2022-05-25 MED ORDER — HEPARIN SOD (PORK) LOCK FLUSH 100 UNIT/ML IV SOLN
500.0000 [IU] | Freq: Once | INTRAVENOUS | Status: AC | PRN
  Administered 2022-05-25: 500 [IU]

## 2022-05-25 MED FILL — Dexamethasone Sodium Phosphate Inj 100 MG/10ML: INTRAMUSCULAR | Qty: 1 | Status: AC

## 2022-05-26 ENCOUNTER — Inpatient Hospital Stay: Payer: Medicare HMO

## 2022-05-26 VITALS — BP 135/68 | HR 87 | Temp 98.4°F | Resp 16

## 2022-05-26 DIAGNOSIS — I10 Essential (primary) hypertension: Secondary | ICD-10-CM | POA: Diagnosis not present

## 2022-05-26 DIAGNOSIS — E785 Hyperlipidemia, unspecified: Secondary | ICD-10-CM | POA: Diagnosis not present

## 2022-05-26 DIAGNOSIS — C9310 Chronic myelomonocytic leukemia not having achieved remission: Secondary | ICD-10-CM | POA: Diagnosis not present

## 2022-05-26 DIAGNOSIS — D693 Immune thrombocytopenic purpura: Secondary | ICD-10-CM | POA: Diagnosis not present

## 2022-05-26 DIAGNOSIS — Z5111 Encounter for antineoplastic chemotherapy: Secondary | ICD-10-CM | POA: Diagnosis not present

## 2022-05-26 DIAGNOSIS — E119 Type 2 diabetes mellitus without complications: Secondary | ICD-10-CM | POA: Diagnosis not present

## 2022-05-26 DIAGNOSIS — Z79899 Other long term (current) drug therapy: Secondary | ICD-10-CM | POA: Diagnosis not present

## 2022-05-26 DIAGNOSIS — Z9884 Bariatric surgery status: Secondary | ICD-10-CM | POA: Diagnosis not present

## 2022-05-26 DIAGNOSIS — Z7984 Long term (current) use of oral hypoglycemic drugs: Secondary | ICD-10-CM | POA: Diagnosis not present

## 2022-05-26 MED ORDER — SODIUM CHLORIDE 0.9 % IV SOLN
75.0000 mg/m2 | Freq: Once | INTRAVENOUS | Status: AC
  Administered 2022-05-26: 130 mg via INTRAVENOUS
  Filled 2022-05-26: qty 13

## 2022-05-26 MED ORDER — HEPARIN SOD (PORK) LOCK FLUSH 100 UNIT/ML IV SOLN
500.0000 [IU] | Freq: Once | INTRAVENOUS | Status: AC | PRN
  Administered 2022-05-26: 500 [IU]

## 2022-05-26 MED ORDER — SODIUM CHLORIDE 0.9 % IV SOLN
10.0000 mg | Freq: Once | INTRAVENOUS | Status: AC
  Administered 2022-05-26: 10 mg via INTRAVENOUS
  Filled 2022-05-26: qty 10

## 2022-05-26 MED ORDER — SODIUM CHLORIDE 0.9% FLUSH
10.0000 mL | INTRAVENOUS | Status: DC | PRN
  Administered 2022-05-26: 10 mL

## 2022-05-26 MED ORDER — SODIUM CHLORIDE 0.9 % IV SOLN: Freq: Once | INTRAVENOUS | Status: AC

## 2022-05-26 MED FILL — Dexamethasone Sodium Phosphate Inj 100 MG/10ML: INTRAMUSCULAR | Qty: 1 | Status: AC

## 2022-05-26 NOTE — Patient Instructions (Signed)
[facility]   Discharge Instructions: Thank you for choosing [facility] to provide your oncology and hematology care.   If you have a lab appointment with the Cancer Center, please go directly to the Cancer Center and check in at the registration area.   Wear comfortable clothing and clothing appropriate for easy access to any Portacath or PICC line.   We strive to give you quality time with your provider. You may need to reschedule your appointment if you arrive late (15 or more minutes).  Arriving late affects you and other patients whose appointments are after yours.  Also, if you miss three or more appointments without notifying the office, you may be dismissed from the clinic at the provider's discretion.      For prescription refill requests, have your pharmacy contact our office and allow 72 hours for refills to be completed.    Today you received the following chemotherapy and/or immunotherapy agents: Azacitidine      To help prevent nausea and vomiting after your treatment, we encourage you to take your nausea medication as directed.  BELOW ARE SYMPTOMS THAT SHOULD BE REPORTED IMMEDIATELY: *FEVER GREATER THAN 100.4 F (38 C) OR HIGHER *CHILLS OR SWEATING *NAUSEA AND VOMITING THAT IS NOT CONTROLLED WITH YOUR NAUSEA MEDICATION *UNUSUAL SHORTNESS OF BREATH *UNUSUAL BRUISING OR BLEEDING *URINARY PROBLEMS (pain or burning when urinating, or frequent urination) *BOWEL PROBLEMS (unusual diarrhea, constipation, pain near the anus) TENDERNESS IN MOUTH AND THROAT WITH OR WITHOUT PRESENCE OF ULCERS (sore throat, sores in mouth, or a toothache) UNUSUAL RASH, SWELLING OR PAIN  UNUSUAL VAGINAL DISCHARGE OR ITCHING   Items with * indicate a potential emergency and should be followed up as soon as possible or go to the Emergency Department if any problems should occur.  Please show the CHEMOTHERAPY ALERT CARD or IMMUNOTHERAPY ALERT CARD at check-in  to the Emergency Department and triage nurse.  Should you have questions after your visit or need to cancel or reschedule your appointment, please contact [facility]  Dept: [phone]  and follow the prompts.  Office hours are 8:00 a.m. to 4:30 p.m. Monday - Friday. Please note that voicemails left after 4:00 p.m. may not be returned until the following business day.  We are closed weekends and major holidays. You have access to a nurse at all times for urgent questions. Please call the main number to the clinic Dept: [phone] and follow the prompts.   For any non-urgent questions, you may also contact your provider using MyChart. We now offer e-Visits for anyone 18 and older to request care online for non-urgent symptoms. For details visit [email or web address].   Also download the MyChart app! Go to the app store, search "MyChart", open the app, select [facility], and log in with your MyChart username and password.  Masks are optional in the cancer centers. If you would like for your care team to wear a mask while they are taking care of you, please let them know. You may have one support person who is at least 70 years old accompany you for your appointments. 

## 2022-05-27 ENCOUNTER — Inpatient Hospital Stay: Payer: Medicare HMO

## 2022-05-27 ENCOUNTER — Telehealth: Payer: Self-pay | Admitting: Family Medicine

## 2022-05-27 VITALS — BP 118/67 | HR 79 | Temp 98.2°F | Resp 17

## 2022-05-27 DIAGNOSIS — Z7984 Long term (current) use of oral hypoglycemic drugs: Secondary | ICD-10-CM | POA: Diagnosis not present

## 2022-05-27 DIAGNOSIS — I10 Essential (primary) hypertension: Secondary | ICD-10-CM | POA: Diagnosis not present

## 2022-05-27 DIAGNOSIS — D693 Immune thrombocytopenic purpura: Secondary | ICD-10-CM | POA: Diagnosis not present

## 2022-05-27 DIAGNOSIS — E785 Hyperlipidemia, unspecified: Secondary | ICD-10-CM | POA: Diagnosis not present

## 2022-05-27 DIAGNOSIS — E119 Type 2 diabetes mellitus without complications: Secondary | ICD-10-CM | POA: Diagnosis not present

## 2022-05-27 DIAGNOSIS — Z5111 Encounter for antineoplastic chemotherapy: Secondary | ICD-10-CM | POA: Diagnosis not present

## 2022-05-27 DIAGNOSIS — C9310 Chronic myelomonocytic leukemia not having achieved remission: Secondary | ICD-10-CM

## 2022-05-27 DIAGNOSIS — Z9884 Bariatric surgery status: Secondary | ICD-10-CM | POA: Diagnosis not present

## 2022-05-27 DIAGNOSIS — Z79899 Other long term (current) drug therapy: Secondary | ICD-10-CM | POA: Diagnosis not present

## 2022-05-27 MED ORDER — SODIUM CHLORIDE 0.9 % IV SOLN
75.0000 mg/m2 | Freq: Once | INTRAVENOUS | Status: AC
  Administered 2022-05-27: 130 mg via INTRAVENOUS
  Filled 2022-05-27: qty 13

## 2022-05-27 MED ORDER — SODIUM CHLORIDE 0.9 % IV SOLN: Freq: Once | INTRAVENOUS | Status: AC

## 2022-05-27 MED ORDER — SODIUM CHLORIDE 0.9 % IV SOLN
10.0000 mg | Freq: Once | INTRAVENOUS | Status: AC
  Administered 2022-05-27: 10 mg via INTRAVENOUS
  Filled 2022-05-27: qty 10

## 2022-05-27 MED ORDER — HEPARIN SOD (PORK) LOCK FLUSH 100 UNIT/ML IV SOLN
500.0000 [IU] | Freq: Once | INTRAVENOUS | Status: AC | PRN
  Administered 2022-05-27: 500 [IU]

## 2022-05-27 MED ORDER — SODIUM CHLORIDE 0.9% FLUSH
10.0000 mL | INTRAVENOUS | Status: DC | PRN
  Administered 2022-05-27: 10 mL

## 2022-05-27 NOTE — Telephone Encounter (Signed)
Raquel Sarna with [employer] called to verify if Ms. Holck has had a bone densitiy scan this year, She is asking for a clinical member to verify this information.  Raquel Sarna can be reached at [phone]

## 2022-05-27 NOTE — Patient Instructions (Signed)
[facility]   Discharge Instructions: Thank you for choosing [facility] to provide your oncology and hematology care.   If you have a lab appointment with the [facility], please go directly to the [facility] and check in at the registration area.   Wear comfortable clothing and clothing appropriate for easy access to any Portacath or PICC line.   We strive to give you quality time with your provider. You may need to reschedule your appointment if you arrive late (15 or more minutes).  Arriving late affects you and other patients whose appointments are after yours.  Also, if you miss three or more appointments without notifying the office, you may be dismissed from the clinic at the provider's discretion.      For prescription refill requests, have your pharmacy contact our office and allow 72 hours for refills to be completed.    Today you received the following chemotherapy and/or immunotherapy agents: Azacitidine (Vidaza)       To help prevent nausea and vomiting after your treatment, we encourage you to take your nausea medication as directed.  BELOW ARE SYMPTOMS THAT SHOULD BE REPORTED IMMEDIATELY: *FEVER GREATER THAN 100.4 F (38 C) OR HIGHER *CHILLS OR SWEATING *NAUSEA AND VOMITING THAT IS NOT CONTROLLED WITH YOUR NAUSEA MEDICATION *UNUSUAL SHORTNESS OF BREATH *UNUSUAL BRUISING OR BLEEDING *URINARY PROBLEMS (pain or burning when urinating, or frequent urination) *BOWEL PROBLEMS (unusual diarrhea, constipation, pain near the anus) TENDERNESS IN MOUTH AND THROAT WITH OR WITHOUT PRESENCE OF ULCERS (sore throat, sores in mouth, or a toothache) UNUSUAL RASH, SWELLING OR PAIN  UNUSUAL VAGINAL DISCHARGE OR ITCHING   Items with * indicate a potential emergency and should be followed up as soon as possible or go to the Emergency Department if any problems should occur.  Please show the CHEMOTHERAPY ALERT CARD or IMMUNOTHERAPY ALERT CARD  at check-in to the Emergency Department and triage nurse.  Should you have questions after your visit or need to cancel or reschedule your appointment, please contact [facility]  Dept: [phone]  and follow the prompts.  Office hours are 8:00 a.m. to 4:30 p.m. Monday - Friday. Please note that voicemails left after 4:00 p.m. may not be returned until the following business day.  We are closed weekends and major holidays. You have access to a nurse at all times for urgent questions. Please call the main number to the clinic Dept: [phone] and follow the prompts.   For any non-urgent questions, you may also contact your provider using MyChart. We now offer e-Visits for anyone 23 and older to request care online for non-urgent symptoms. For details visit [email or web address].   Also download the MyChart app! Go to the app store, search "MyChart", open the app, select [facility], and log in with your MyChart username and password.  Masks are optional in the cancer centers. If you would like for your care team to wear a mask while they are taking care of you, please let them know. You may have one support person who is at least 70 years old accompany you for your appointments.

## 2022-05-30 NOTE — Telephone Encounter (Signed)
Pt declined DEXA in past.

## 2022-05-31 ENCOUNTER — Inpatient Hospital Stay: Payer: Medicare HMO

## 2022-05-31 ENCOUNTER — Other Ambulatory Visit: Payer: Self-pay

## 2022-05-31 VITALS — BP 126/73 | HR 83 | Temp 98.7°F | Resp 18

## 2022-05-31 DIAGNOSIS — Z9884 Bariatric surgery status: Secondary | ICD-10-CM | POA: Diagnosis not present

## 2022-05-31 DIAGNOSIS — Z7984 Long term (current) use of oral hypoglycemic drugs: Secondary | ICD-10-CM | POA: Diagnosis not present

## 2022-05-31 DIAGNOSIS — Z5111 Encounter for antineoplastic chemotherapy: Secondary | ICD-10-CM | POA: Diagnosis not present

## 2022-05-31 DIAGNOSIS — I10 Essential (primary) hypertension: Secondary | ICD-10-CM | POA: Diagnosis not present

## 2022-05-31 DIAGNOSIS — D696 Thrombocytopenia, unspecified: Secondary | ICD-10-CM

## 2022-05-31 DIAGNOSIS — E119 Type 2 diabetes mellitus without complications: Secondary | ICD-10-CM | POA: Diagnosis not present

## 2022-05-31 DIAGNOSIS — E785 Hyperlipidemia, unspecified: Secondary | ICD-10-CM | POA: Diagnosis not present

## 2022-05-31 DIAGNOSIS — Z95828 Presence of other vascular implants and grafts: Secondary | ICD-10-CM

## 2022-05-31 DIAGNOSIS — Z79899 Other long term (current) drug therapy: Secondary | ICD-10-CM | POA: Diagnosis not present

## 2022-05-31 DIAGNOSIS — C9310 Chronic myelomonocytic leukemia not having achieved remission: Secondary | ICD-10-CM | POA: Diagnosis not present

## 2022-05-31 DIAGNOSIS — D693 Immune thrombocytopenic purpura: Secondary | ICD-10-CM | POA: Diagnosis not present

## 2022-05-31 LAB — CBC WITH DIFFERENTIAL (CANCER CENTER ONLY)
Abs Immature Granulocytes: 0.32 10*3/uL — ABNORMAL HIGH (ref 0.00–0.07)
Basophils Absolute: 0 10*3/uL (ref 0.0–0.1)
Basophils Relative: 1 %
Eosinophils Absolute: 0.1 10*3/uL (ref 0.0–0.5)
Eosinophils Relative: 2 %
HCT: 31 % — ABNORMAL LOW (ref 36.0–46.0)
Hemoglobin: 9.9 g/dL — ABNORMAL LOW (ref 12.0–15.0)
Immature Granulocytes: 6 %
Lymphocytes Relative: 13 %
Lymphs Abs: 0.6 10*3/uL — ABNORMAL LOW (ref 0.7–4.0)
MCH: 26.1 pg (ref 26.0–34.0)
MCHC: 31.9 g/dL (ref 30.0–36.0)
MCV: 81.6 fL (ref 80.0–100.0)
Monocytes Absolute: 0.9 10*3/uL (ref 0.1–1.0)
Monocytes Relative: 17 %
Neutro Abs: 3.1 10*3/uL (ref 1.7–7.7)
Neutrophils Relative %: 61 %
Platelet Count: 58 10*3/uL — ABNORMAL LOW (ref 150–400)
RBC: 3.8 MIL/uL — ABNORMAL LOW (ref 3.87–5.11)
RDW: 22.5 % — ABNORMAL HIGH (ref 11.5–15.5)
WBC Count: 5.1 10*3/uL (ref 4.0–10.5)
nRBC: 0 % (ref 0.0–0.2)

## 2022-05-31 MED ORDER — ROMIPLOSTIM INJECTION 500 MCG
480.0000 ug | Freq: Once | SUBCUTANEOUS | Status: AC
  Administered 2022-05-31: 480 ug via SUBCUTANEOUS
  Filled 2022-05-31: qty 0.96

## 2022-05-31 MED ORDER — SODIUM CHLORIDE 0.9% FLUSH
10.0000 mL | Freq: Once | INTRAVENOUS | Status: AC
  Administered 2022-05-31: 10 mL

## 2022-05-31 MED ORDER — HEPARIN SOD (PORK) LOCK FLUSH 100 UNIT/ML IV SOLN
500.0000 [IU] | Freq: Once | INTRAVENOUS | Status: AC
  Administered 2022-05-31: 500 [IU]

## 2022-05-31 NOTE — Patient Instructions (Signed)
Romiplostim Injection What is this medication? ROMIPLOSTIM (roe mi PLOE stim) treats low levels of platelets in your body caused by immune thrombocytopenia (ITP). It is prescribed when other medications have not worked or cannot be tolerated. It may also be used to help people who have been exposed to high doses of radiation. It works by increasing the amount of platelets in your blood. This lowers the risk of bleeding. This medicine may be used for other purposes; ask your health care provider or pharmacist if you have questions. COMMON BRAND NAME(S): Nplate What should I tell my care team before I take this medication? They need to know if you have any of these conditions: Blood clots Myelodysplastic syndrome An unusual or allergic reaction to romiplostim, mannitol, other medications, foods, dyes, or preservatives Pregnant or trying to get pregnant Breast-feeding How should I use this medication? This medication is injected under the skin. It is given by a care team in a hospital or clinic setting. A special MedGuide will be given to you before each treatment. Be sure to read this information carefully each time. Talk to your care team about the use of this medication in children. While it may be prescribed for children as young as newborns for selected conditions, precautions do apply. Overdosage: If you think you have taken too much of this medicine contact a poison control center or emergency room at once. NOTE: This medicine is only for you. Do not share this medicine with others. What if I miss a dose? Keep appointments for follow-up doses. It is important not to miss your dose. Call your care team if you are unable to keep an appointment. What may interact with this medication? Interactions are not expected. This list may not describe all possible interactions. Give your health care provider a list of all the medicines, herbs, non-prescription drugs, or dietary supplements you use. Also  tell them if you smoke, drink alcohol, or use illegal drugs. Some items may interact with your medicine. What should I watch for while using this medication? Visit your care team for regular checks on your progress. You may need blood work done while you are taking this medication. Your condition will be monitored carefully while you are receiving this medication. It is important not to miss any appointments. What side effects may I notice from receiving this medication? Side effects that you should report to your care team as soon as possible: Allergic reactions--skin rash, itching, hives, swelling of the face, lips, tongue, or throat Blood clot--pain, swelling, or warmth in the leg, shortness of breath, chest pain Side effects that usually do not require medical attention (report to your care team if they continue or are bothersome): Dizziness Joint pain Muscle pain Pain in the hands or feet Stomach pain Trouble sleeping This list may not describe all possible side effects. Call your doctor for medical advice about side effects. You may report side effects to FDA at 1-800-FDA-1088. Where should I keep my medication? This medication is given in a hospital or clinic. It will not be stored at home. NOTE: This sheet is a summary. It may not cover all possible information. If you have questions about this medicine, talk to your doctor, pharmacist, or health care provider.  2023 Elsevier/Gold Standard (2021-09-07 00:00:00)  

## 2022-06-02 ENCOUNTER — Other Ambulatory Visit: Payer: Self-pay | Admitting: Family Medicine

## 2022-06-03 ENCOUNTER — Telehealth: Payer: Self-pay | Admitting: Family Medicine

## 2022-06-03 NOTE — Telephone Encounter (Signed)
Raquel Sarna is calling from [facility] to inquire about patient having a Bone Density scan. Raquel Sarna would like scan to be ordered for patient and will be covered at 100%.  If there are any questions, Raquel Sarna can be reached at [phone]

## 2022-06-07 NOTE — Telephone Encounter (Signed)
Please advise 

## 2022-06-08 ENCOUNTER — Inpatient Hospital Stay: Payer: Medicare HMO

## 2022-06-08 ENCOUNTER — Other Ambulatory Visit: Payer: Self-pay

## 2022-06-08 DIAGNOSIS — E785 Hyperlipidemia, unspecified: Secondary | ICD-10-CM | POA: Diagnosis not present

## 2022-06-08 DIAGNOSIS — Z79899 Other long term (current) drug therapy: Secondary | ICD-10-CM | POA: Diagnosis not present

## 2022-06-08 DIAGNOSIS — C9310 Chronic myelomonocytic leukemia not having achieved remission: Secondary | ICD-10-CM

## 2022-06-08 DIAGNOSIS — Z95828 Presence of other vascular implants and grafts: Secondary | ICD-10-CM

## 2022-06-08 DIAGNOSIS — D696 Thrombocytopenia, unspecified: Secondary | ICD-10-CM

## 2022-06-08 DIAGNOSIS — E119 Type 2 diabetes mellitus without complications: Secondary | ICD-10-CM | POA: Diagnosis not present

## 2022-06-08 DIAGNOSIS — Z7984 Long term (current) use of oral hypoglycemic drugs: Secondary | ICD-10-CM | POA: Diagnosis not present

## 2022-06-08 DIAGNOSIS — Z5111 Encounter for antineoplastic chemotherapy: Secondary | ICD-10-CM | POA: Diagnosis not present

## 2022-06-08 DIAGNOSIS — I10 Essential (primary) hypertension: Secondary | ICD-10-CM | POA: Diagnosis not present

## 2022-06-08 DIAGNOSIS — D693 Immune thrombocytopenic purpura: Secondary | ICD-10-CM | POA: Diagnosis not present

## 2022-06-08 DIAGNOSIS — Z9884 Bariatric surgery status: Secondary | ICD-10-CM | POA: Diagnosis not present

## 2022-06-08 LAB — CBC WITH DIFFERENTIAL (CANCER CENTER ONLY)
Abs Immature Granulocytes: 0.24 10*3/uL — ABNORMAL HIGH (ref 0.00–0.07)
Basophils Absolute: 0 10*3/uL (ref 0.0–0.1)
Basophils Relative: 1 %
Eosinophils Absolute: 0 10*3/uL (ref 0.0–0.5)
Eosinophils Relative: 0 %
HCT: 32.3 % — ABNORMAL LOW (ref 36.0–46.0)
Hemoglobin: 10.2 g/dL — ABNORMAL LOW (ref 12.0–15.0)
Immature Granulocytes: 4 %
Lymphocytes Relative: 10 %
Lymphs Abs: 0.7 10*3/uL (ref 0.7–4.0)
MCH: 26 pg (ref 26.0–34.0)
MCHC: 31.6 g/dL (ref 30.0–36.0)
MCV: 82.4 fL (ref 80.0–100.0)
Monocytes Absolute: 1.6 10*3/uL — ABNORMAL HIGH (ref 0.1–1.0)
Monocytes Relative: 23 %
Neutro Abs: 4.3 10*3/uL (ref 1.7–7.7)
Neutrophils Relative %: 62 %
Platelet Count: 48 10*3/uL — ABNORMAL LOW (ref 150–400)
RBC: 3.92 MIL/uL (ref 3.87–5.11)
RDW: 24.1 % — ABNORMAL HIGH (ref 11.5–15.5)
WBC Count: 6.8 10*3/uL (ref 4.0–10.5)
nRBC: 0 % (ref 0.0–0.2)

## 2022-06-08 LAB — COMPREHENSIVE METABOLIC PANEL
ALT: 19 U/L (ref 0–44)
AST: 21 U/L (ref 15–41)
Albumin: 3.6 g/dL (ref 3.5–5.0)
Alkaline Phosphatase: 156 U/L — ABNORMAL HIGH (ref 38–126)
Anion gap: 8 (ref 5–15)
BUN: 14 mg/dL (ref 8–23)
CO2: 24 mmol/L (ref 22–32)
Calcium: 8.8 mg/dL — ABNORMAL LOW (ref 8.9–10.3)
Chloride: 108 mmol/L (ref 98–111)
Creatinine, Ser: 0.72 mg/dL (ref 0.44–1.00)
GFR, Estimated: >60 mL/min (ref >60)
Glucose, Bld: 325 mg/dL — ABNORMAL HIGH (ref 70–99)
Potassium: 4.2 mmol/L (ref 3.5–5.1)
Sodium: 140 mmol/L (ref 135–145)
Total Bilirubin: 1.2 mg/dL (ref 0.3–1.2)
Total Protein: 7.2 g/dL (ref 6.5–8.1)

## 2022-06-08 MED ORDER — SODIUM CHLORIDE 0.9% FLUSH
10.0000 mL | Freq: Once | INTRAVENOUS | Status: AC
  Administered 2022-06-08: 10 mL

## 2022-06-08 MED ORDER — ROMIPLOSTIM INJECTION 500 MCG
550.0000 ug | Freq: Once | SUBCUTANEOUS | Status: AC
  Administered 2022-06-08: 550 ug via SUBCUTANEOUS
  Filled 2022-06-08: qty 1

## 2022-06-08 MED ORDER — HEPARIN SOD (PORK) LOCK FLUSH 100 UNIT/ML IV SOLN
500.0000 [IU] | Freq: Once | INTRAVENOUS | Status: AC
  Administered 2022-06-08: 500 [IU]

## 2022-06-09 DIAGNOSIS — Z9889 Other specified postprocedural states: Secondary | ICD-10-CM | POA: Diagnosis not present

## 2022-06-09 DIAGNOSIS — R609 Edema, unspecified: Secondary | ICD-10-CM | POA: Diagnosis not present

## 2022-06-09 DIAGNOSIS — Z79891 Long term (current) use of opiate analgesic: Secondary | ICD-10-CM | POA: Diagnosis not present

## 2022-06-09 DIAGNOSIS — I10 Essential (primary) hypertension: Secondary | ICD-10-CM | POA: Diagnosis not present

## 2022-06-09 DIAGNOSIS — L989 Disorder of the skin and subcutaneous tissue, unspecified: Secondary | ICD-10-CM | POA: Diagnosis not present

## 2022-06-09 DIAGNOSIS — R69 Illness, unspecified: Secondary | ICD-10-CM | POA: Diagnosis not present

## 2022-06-09 DIAGNOSIS — M549 Dorsalgia, unspecified: Secondary | ICD-10-CM | POA: Diagnosis not present

## 2022-06-09 DIAGNOSIS — C9310 Chronic myelomonocytic leukemia not having achieved remission: Secondary | ICD-10-CM | POA: Diagnosis not present

## 2022-06-09 DIAGNOSIS — E119 Type 2 diabetes mellitus without complications: Secondary | ICD-10-CM | POA: Diagnosis not present

## 2022-06-09 DIAGNOSIS — Z7984 Long term (current) use of oral hypoglycemic drugs: Secondary | ICD-10-CM | POA: Diagnosis not present

## 2022-06-09 DIAGNOSIS — Z9884 Bariatric surgery status: Secondary | ICD-10-CM | POA: Diagnosis not present

## 2022-06-09 DIAGNOSIS — R238 Other skin changes: Secondary | ICD-10-CM | POA: Diagnosis not present

## 2022-06-09 DIAGNOSIS — M25559 Pain in unspecified hip: Secondary | ICD-10-CM | POA: Diagnosis not present

## 2022-06-09 NOTE — Telephone Encounter (Signed)
Pt declined

## 2022-06-09 NOTE — Telephone Encounter (Signed)
It appears patient had her Medicare wellness visit in March 2023.  If patient desired bone density screening, a order should have been placed during that visit.   I would recommend calling patient and asking her if she would like to have a bone density test completed, to ensure we are not ordering something patient does not desire to have completed.  If she does want to have the bone density screening, then please place the order for her since her Medicare wellness visit is up-to-date.

## 2022-06-15 ENCOUNTER — Inpatient Hospital Stay: Payer: Medicare HMO

## 2022-06-15 ENCOUNTER — Other Ambulatory Visit: Payer: Self-pay

## 2022-06-15 ENCOUNTER — Inpatient Hospital Stay: Payer: Medicare HMO | Attending: Hematology

## 2022-06-15 VITALS — BP 128/72 | HR 86 | Temp 98.4°F | Resp 16

## 2022-06-15 DIAGNOSIS — D693 Immune thrombocytopenic purpura: Secondary | ICD-10-CM | POA: Insufficient documentation

## 2022-06-15 DIAGNOSIS — E785 Hyperlipidemia, unspecified: Secondary | ICD-10-CM | POA: Diagnosis not present

## 2022-06-15 DIAGNOSIS — Z9884 Bariatric surgery status: Secondary | ICD-10-CM | POA: Insufficient documentation

## 2022-06-15 DIAGNOSIS — Z5111 Encounter for antineoplastic chemotherapy: Secondary | ICD-10-CM | POA: Insufficient documentation

## 2022-06-15 DIAGNOSIS — C9310 Chronic myelomonocytic leukemia not having achieved remission: Secondary | ICD-10-CM | POA: Diagnosis present

## 2022-06-15 DIAGNOSIS — Z7984 Long term (current) use of oral hypoglycemic drugs: Secondary | ICD-10-CM | POA: Insufficient documentation

## 2022-06-15 DIAGNOSIS — D696 Thrombocytopenia, unspecified: Secondary | ICD-10-CM

## 2022-06-15 DIAGNOSIS — I10 Essential (primary) hypertension: Secondary | ICD-10-CM | POA: Insufficient documentation

## 2022-06-15 DIAGNOSIS — Z95828 Presence of other vascular implants and grafts: Secondary | ICD-10-CM

## 2022-06-15 DIAGNOSIS — Z79899 Other long term (current) drug therapy: Secondary | ICD-10-CM | POA: Insufficient documentation

## 2022-06-15 DIAGNOSIS — E119 Type 2 diabetes mellitus without complications: Secondary | ICD-10-CM | POA: Diagnosis not present

## 2022-06-15 LAB — CBC WITH DIFFERENTIAL (CANCER CENTER ONLY)
Abs Immature Granulocytes: 0.03 10*3/uL (ref 0.00–0.07)
Basophils Absolute: 0 10*3/uL (ref 0.0–0.1)
Basophils Relative: 0 %
Eosinophils Absolute: 0 10*3/uL (ref 0.0–0.5)
Eosinophils Relative: 1 %
HCT: 27.4 % — ABNORMAL LOW (ref 36.0–46.0)
Hemoglobin: 8.8 g/dL — ABNORMAL LOW (ref 12.0–15.0)
Immature Granulocytes: 1 %
Lymphocytes Relative: 13 %
Lymphs Abs: 0.6 10*3/uL — ABNORMAL LOW (ref 0.7–4.0)
MCH: 26.4 pg (ref 26.0–34.0)
MCHC: 32.1 g/dL (ref 30.0–36.0)
MCV: 82.3 fL (ref 80.0–100.0)
Monocytes Absolute: 1.1 10*3/uL — ABNORMAL HIGH (ref 0.1–1.0)
Monocytes Relative: 23 %
Neutro Abs: 3.1 10*3/uL (ref 1.7–7.7)
Neutrophils Relative %: 62 %
Platelet Count: 78 10*3/uL — ABNORMAL LOW (ref 150–400)
RBC: 3.33 MIL/uL — ABNORMAL LOW (ref 3.87–5.11)
RDW: 24.1 % — ABNORMAL HIGH (ref 11.5–15.5)
WBC Count: 4.9 10*3/uL (ref 4.0–10.5)
nRBC: 0 % (ref 0.0–0.2)

## 2022-06-15 MED ORDER — ROMIPLOSTIM INJECTION 500 MCG
550.0000 ug | Freq: Once | SUBCUTANEOUS | Status: AC
  Administered 2022-06-15: 550 ug via SUBCUTANEOUS
  Filled 2022-06-15: qty 1

## 2022-06-15 MED ORDER — SODIUM CHLORIDE 0.9% FLUSH
10.0000 mL | Freq: Once | INTRAVENOUS | Status: AC
  Administered 2022-06-15: 10 mL

## 2022-06-15 MED ORDER — HEPARIN SOD (PORK) LOCK FLUSH 100 UNIT/ML IV SOLN
500.0000 [IU] | Freq: Once | INTRAVENOUS | Status: AC
  Administered 2022-06-15: 500 [IU]

## 2022-06-17 ENCOUNTER — Other Ambulatory Visit: Payer: Self-pay

## 2022-06-17 MED FILL — Dexamethasone Sodium Phosphate Inj 100 MG/10ML: INTRAMUSCULAR | Qty: 1 | Status: AC

## 2022-06-17 NOTE — Progress Notes (Unsigned)
[place]   [phone] [phone]   Clinic Follow up Note   Patient Care Team: [name], DO as PCP - General (Family Medicine) Loletha Carrow, Kirke Corin, MD as Consulting Physician (Gastroenterology) Truitt Merle, MD as Consulting Physician (Hematology) Alda Berthold, DO as Consulting Physician (Neurology)  Date of Service:  06/17/2022  CHIEF COMPLAINT: f/u of  [name], severe thrombocytopenia   CURRENT THERAPY:  Azacitidine days 1-5 q28 days, started 12/28/20 Nplate 78mg/kg weekly Platelet transfusion as needed (plt<15K), blood transfusion if Hg<8.0     ASSESSMENT: *** Kathleen JEREZis a 72y.o. female with   No problem-specific Assessment & Plan notes found for this encounter.  ***   PLAN:  SUMMARY OF ONCOLOGIC HISTORY: Oncology History  [name] (ch[place] myel[place]ocytic [place]) [place]  10/29/2020 Imaging   CT CAP  IMPRESSION: 1. Splenomegaly with pathologically enlarged lymph nodes above and below the diaphragm, with overall stable to minimally increased abdominal adenopathy and interval progression of the pelvic adenopathy. 2. Small volume abdominopelvic ascites with diffuse mesenteric stranding. 3. Scattered bilateral pulmonary micro nodules measuring 1-2 mm. 4. Distended gallbladder with some layering hyperdense material representing layering sludge and tiny stones seen on prior ultrasound. 5. Aortic atherosclerosis.   10/30/2020 Pathology Results   DIAGNOSIS:   BONE MARROW, ASPIRATE, CLOT, CORE:  -  Hypercellular bone marrow with panhyperplasia, atypia, and no  increase in blasts  -  See comment   PERIPHERAL BLOOD:  -  Marked thrombocytopenia  -  Absolute monocytosis  -  Normocytic anemia  -  See CBC data and comment   COMMENT:  The bone marrow is hypercellular for age (approximately 80%) with myeloid hyperplasia with maturational left shift, erythroid hyperplasia, and increased megakaryocytes.  Mild multilineage atypia is  present. Blasts are not increased on aspirate smears (1% by manual differential count) or by CD34 immunohistochemistry on the core biopsy.  Concurrent flow cytometric analysis of the bone marrow aspirate demonstrates increased monocytes, and no increase in blasts or abnormal lymphoid population (see [id number].  Monocytes are also increased in peripheral  blood, persistent per electronic medical record.  In aggregate, the  findings raise the possibility of a myeloid neoplasm with the  differential diagnosis including a low-grade myelodysplastic syndrome and chronic myelomonocytic leukemia (dysplastic type).    ADDENDUM:  A reticulin special stain performed on the bone marrow core biopsy reveals no significant increase in reticulin fibrosis.  ADDENDUM:  CYTOGENETIC RESULTS:  Karyotype: 46,XX[20]  Interpretation: NORMAL FEMALE KARYOTYPE   FISH RESULTS:  Results: NORMAL   ADDENDUM:  CD123 immunohistochemistry performed on the core biopsy highlights scattered aggregates of positively staining cells consistent with plasmacytoid dendritic cells.    10/30/2020 Pathology Results   DIAGNOSIS:   BONE MARROW; FLOW CYTOMETRIC ANALYSIS:  -  Increased monocytes  -  Scant B-cells present  -  No immunophenotypically aberrant T-cell population identified  -  No increase in blasts  -  See comment   COMMENT:  Monocytes are relatively increased (12% of all cells), without aberrant expression of CD56.  B-cells comprise <1% of total lymphocytes. CD34-positive blasts are not increased (<1% of all cells).  Correlation with concurrent morphology is recommended for complete diagnostic interpretation and overall blast enumeration (see [id number].    10/30/2020 Pathology Results   FINAL MICROSCOPIC DIAGNOSIS:   A. LYMPH NODE, RIGHT AXILLARY, NEEDLE CORE BIOPSY:  -Lymphoid tissue present  -See comment   COMMENT:  The sections show several small needle core biopsy fragments  of lymphoid tissue displaying  degenerative cellular changes/necrosis and hence cannot be accurately evaluated.  Sample for flow cytometric analysis not available.  Immunohistochemical stain for CD3 and CD20 were performed with appropriate controls.  There is a mixture of T and B cells in their apparently respective compartments.  There is no definite metastatic malignancy.    11/11/2020 Pathology Results   DIAGNOSIS:   LEFT AXILLARY LYMPH NODE EXCISIONAL BIOPSY; FLOW CYTOMETRIC ANALYSIS:  -  No monotypic B-cell or immunophenotypically aberrant T-cell  population identified  -  See comment   COMMENT:  Flow cytometric analysis identifies B-cells with a normal kappa:lambda ratio of 1.7:1.  A subset of the polytypic B-cells expresses CD10. T-cells show a CD4:CD8 ratio of 3.3:1 without immunophenotypic aberrancy with the markers evaluated.  Although these results do not support the diagnosis of a clonal lymphoid population, sampling issues must always be considered when negative results are obtained, as focal lesions may not be represented in the specimen submitted.  In addition, flow cytometric immunophenotyping will not exclude other pathology if present (e.g. Hodgkin lymphoma, some T-cell lymphomas, metastatic and infectious diseases).   11/11/2020 Pathology Results   FINAL MICROSCOPIC DIAGNOSIS:   A. LYMPH NODE, LEFT AXILLARY, DISSECTION:  -  Follicular hyperplasia with interfollicular expansion  -  See comment   COMMENT:  Sections of the lymph nodes reveal generally preserved lymph node architecture.  There is follicular hyperplasia with some follicles showing increased hyalinization of germinal centers and mild concentric encircling of germinal centers with small lymphocytes (onion skinning). Interfollicular areas are expanded with increased vascular proliferation, small lymphocytes, plasma cells, histiocytic cells, and patchy neutrophils.  The lymph node capsule is variably thickened by fibrosis with few plasma cells.   A  panel of immunohistochemical stains is performed for further  characterization.  CD3 and CD20/PAX5 highlight T-cell and B-cell  compartments, respectively.  Germinal centers are highlighted by CD10 and BCL6 with appropriate absent expression of BCL2.  CD5 is similar to CD3.  CD43 also stains the T-cells.  CD4-positive T-cells exceed CD8-positive T-cells.  CD30 highlights scattered immunoblasts.  CD21 reveals intact follicular dendritic cell meshworks.  CD68 highlights increased histiocytic cells.  HHV 8 is negative.  T. pallidum reveals no definitive organisms.  Pancytokeratin is negative.  TdT stains rare cells.  CD138 highlights plasma cells which are not increased in number and show polytypic light chain expression by kappa/lambda in situ  hybridization.  EBV is negative by in situ hybridization.   Concurrent flow cytometric analysis is negative for a monoclonal B-cell or immunophenotypically aberrant T-cell population (see [id number]).   Together, the findings above are non-specific and can be seen in  reactive and infectious processes as well as Castleman disease.  There is no definitive morphologic or flow cytometric evidence of involvement by a lymphoproliferative disorder from the current workup.   12/17/2020 Initial Diagnosis   [name] (chronic myelomonocytic leukemia) ([place])   12/28/2020 - 02/03/2022 Chemotherapy   Patient is on Treatment Plan : MYELODYSPLASIA  Azacitidine IV D1-5 q28d     03/18/2021 Pathology Results   Final Diagnosis    BONE MARROW:             Persistent chronic myelomonocytic leukemia in a hypercellular bone marrow with increased monocytes and no increase in blasts.   Comment    Flow cytometric analysis showed no increase in blasts and 17% monocytes.A next generation myeloid panel showed the presence of the following mutations: NRAS, SH2B3, PHF6 and TET2. CD34 and CD117 immunstains  do not show an increase in blasts. The findings are consistent with persistent [name] with a  decrease in the number of blasts compared to that seen on the previous bone marrow.    Final Interpretation      BONE MARROW; FLOW CYTOMETRIC ANALYSIS:   No increased blasts identified. (see comment)  Monocytosis (17% of total events).    COMMENT: Flow cytometry identified about 0.6% of total events as immature cells. These cells express CD45 (dim), CD34, CD117, HLA-DR, CD38, CD33 (variable). This immunophenotype is consistent with normal myeloblasts. In addition, monocytes are increased and comprise of approximately 17% of total events. These monocytes show normal expression of CD33/CD64/CD14/CD11b with variable expression of CD4. Correlation with concurrent morphology and clinical data is recommended (see [id number]).    FLOW CYTOMETRY ANALYSIS: CD45 versus side scatter analysis demonstrate a predominance of granulocytes (~70%), monocytes (~17%) and lymphocytes (~6%). No significant increase in blasts identified. All tested markers were used for adequate analysis of the cells and were appropriately reviewed.     12/28/2021 -  Chemotherapy   Patient is on Treatment Plan : MYELODYSPLASIA  Azacitidine IV D1-5 q28d        INTERVAL HISTORY: *** NEELIE WELSHANS is here for a follow up of  [name], severe thrombocytopenia  She was last seen by me on 05/23/22 She presents to the clinic    All other systems were reviewed with the patient and are negative.  MEDICAL HISTORY:  Past Medical History:  Diagnosis Date   Diabetes ([place])    HLD (hyperlipidemia)    Hypertension    Pernicious anemia    Pneumonia 09/05/2019   Renal insufficiency 09/06/2019   Vitamin D deficiency     SURGICAL HISTORY: Past Surgical History:  Procedure Laterality Date   ABDOMINAL HERNIA REPAIR  2009   ABDOMINOPLASTY     AXILLARY LYMPH NODE BIOPSY Left 11/11/2020   Procedure: LEFT AXILLARY EXCISIONAL LYMPH NODE BIOPSY;  Surgeon: [name], MD;  Location: [facility];  Service: General;  Laterality: Left;   [place]   GASTRIC BYPASS  2000   hemorroid surgery  2007   IR IMAGING GUIDED PORT INSERTION  07/26/2021   IR KYPHO EA ADDL LEVEL THORACIC OR LUMBAR  05/31/2021   IR KYPHO THORACIC WITH BONE BIOPSY  05/31/2021   TONSILLECTOMY AND ADENOIDECTOMY  1957    I have reviewed the social history and family history with the patient and they are unchanged from previous note.  ALLERGIES:  is allergic to asa [aspirin], nsaids, and red blood cells.  MEDICATIONS:  Current Outpatient Medications  Medication Sig Dispense Refill   cyanocobalamin (,VITAMIN B-12,) 1000 MCG/ML injection Inject 1,000 mcg into the muscle every 30 (thirty) days.     cyclobenzaprine (FLEXERIL) 5 MG tablet Take 1 tablet (5 mg total) by mouth 3 (three) times daily as needed for muscle spasms. 30 tablet 0   furosemide (LASIX) 20 MG tablet TAKE 1 TABLET BY MOUTH EVERY DAY FOR UP TO 7 DAYS THEN AS NEEDED FOR LEG EDEMA 90 tablet 1   glucose blood (ONETOUCH VERIO) test strip Monitor blood sugars twice daily 100 each 5   KLOR-CON M10 10 MEQ tablet TAKE 1 TABLET BY MOUTH TWICE A DAY TAKE ALONG WITH FUROSEMIDE ONLY 180 tablet 1   Lancets (ONETOUCH ULTRASOFT) lancets Monitor blood sugars twice daily 100 each 5   lidocaine-prilocaine (EMLA) cream APPLY 2 GRAMS TO PORT-A-CATH SITE 30-60 MINUTES PRIOR TO PORT ACCESS AS NEEDED 30 g 1  metoprolol succinate (TOPROL-XL) 25 MG 24 hr tablet Take 1 tablet (25 mg total) by mouth daily. 90 tablet 1   naloxone (NARCAN) nasal spray 4 mg/0.1 mL Place 1 spray into the nose once as needed (overdose). 2 each 0   ondansetron (ZOFRAN-ODT) 4 MG disintegrating tablet Take 1 tablet (4 mg total) by mouth every 8 (eight) hours as needed for nausea or vomiting. 20 tablet 0   Oxycodone HCl 10 MG TABS Take 1 tablet (10 mg total) by mouth 2 (two) times daily as needed. 60 tablet 0   polyethylene glycol powder (GLYCOLAX/MIRALAX) 17 GM/SCOOP powder Dissolve 1 capful (17 g) in water and drink 2 (two) times daily. (Patient  taking differently: Take 17 g by mouth daily.) 510 g 2   pregabalin (LYRICA) 150 MG capsule Take 1 capsule (150 mg total) by mouth 2 (two) times daily. 180 capsule 1   promethazine-dextromethorphan (PROMETHAZINE-DM) 6.25-15 MG/5ML syrup Take 5 mLs by mouth 4 (four) times daily as needed. 118 mL 0   senna-docusate (SENOKOT-S) 8.6-50 MG tablet Take 2 tablets by mouth at bedtime as needed for mild constipation. 60 tablet 1   sitaGLIPtin-metformin (JANUMET) 50-1000 MG tablet Take 1 tablet by mouth at bedtime. 90 tablet 1   No current facility-administered medications for this visit.   Facility-Administered Medications Ordered in Other Visits  Medication Dose Route Frequency Provider Last Rate Last Admin   0.9 %  sodium chloride infusion (Manually program via Guardrails IV Fluids)  250 mL Intravenous Once Truitt Merle, MD        PHYSICAL EXAMINATION: ECOG PERFORMANCE STATUS: {CHL ONC ECOG HC:[id number]}  There were no vitals filed for this visit. Wt Readings from Last 3 Encounters:  04/25/22 151 lb 1.6 oz (68.5 kg)  04/04/22 144 lb 9.6 oz (65.6 kg)  03/28/22 147 lb 9.6 oz (67 kg)    {Only keep what was examined. If exam not performed, can use .CEXAM } GENERAL:alert, no distress and comfortable SKIN: skin color, texture, turgor are normal, no rashes or significant lesions EYES: normal, Conjunctiva are pink and non-injected, sclera clear {OROPHARYNX:no exudate, no erythema and lips, buccal mucosa, and tongue normal}  NECK: supple, thyroid normal size, non-tender, without nodularity LYMPH:  no palpable lymphadenopathy in the cervical, axillary {or inguinal} LUNGS: clear to auscultation and percussion with normal breathing effort HEART: regular rate & rhythm and no murmurs and no lower extremity edema ABDOMEN:abdomen soft, non-tender and normal bowel sounds Musculoskeletal:no cyanosis of digits and no clubbing  NEURO: alert & oriented x 3 with fluent speech, no focal motor/sensory  deficits  LABORATORY DATA:  I have reviewed the data as listed    Latest Ref Rng & Units 06/15/2022   12:18 PM 06/08/2022    8:33 AM 05/31/2022    2:48 PM  CBC  WBC 4.0 - 10.5 K/uL 4.9  6.8  5.1   Hemoglobin 12.0 - 15.0 g/dL 8.8  10.2  9.9   Hematocrit 36.0 - 46.0 % 27.4  32.3  31.0   Platelets 150 - 400 K/uL 78  48  58         Latest Ref Rng & Units 06/08/2022    8:33 AM 05/23/2022   10:46 AM 04/25/2022    8:45 AM  CMP  Glucose 70 - 99 mg/dL 325  145  126   BUN 8 - 23 mg/dL '14  12  12   '$ Creatinine 0.44 - 1.00 mg/dL 0.72  0.58  0.59   Sodium 135 - 145 mmol/L  140  141  139   Potassium 3.5 - 5.1 mmol/L 4.2  3.8  4.2   Chloride 98 - 111 mmol/L 108  110  107   CO2 22 - 32 mmol/L '24  27  27   '$ Calcium 8.9 - 10.3 mg/dL 8.8  8.2  8.4   Total Protein 6.5 - 8.1 g/dL 7.2  5.9  6.9   Total Bilirubin 0.3 - 1.2 mg/dL 1.2  0.8  1.1   Alkaline Phos 38 - 126 U/L 156  118  116   AST 15 - 41 U/L '21  14  19   '$ ALT 0 - 44 U/L '19  12  18       '$ RADIOGRAPHIC STUDIES: I have personally reviewed the radiological images as listed and agreed with the findings in the report. No results found.    No orders of the defined types were placed in this encounter.  All questions were answered. The patient knows to call the clinic with any problems, questions or concerns. No barriers to learning was detected. The total time spent in the appointment was {CHL ONC TIME VISIT - FIEPP:[id number]}.     Baldemar Friday, CMA 06/17/2022   I, Audry Riles, CMA, am acting as scribe for Truitt Merle, MD.   {Add scribe attestation statement}

## 2022-06-17 NOTE — Telephone Encounter (Signed)
Kathleen Gibson calling from [facility] calling to check status of request for patient to have bone density scan. Per notes patient has declined.

## 2022-06-18 ENCOUNTER — Other Ambulatory Visit: Payer: Self-pay | Admitting: Hematology

## 2022-06-19 NOTE — Assessment & Plan Note (Signed)
-  diagnosed in 10/2020, presented with severe thrombocytopenia  --She began azacitidine daily days 1-5 q. 28 days on 12/28/20 -She had worsening thrombocytopenia after stopping Nplate, restarted  -Bone marrow biopsy 03/18/21 showed stable disease, no increase in blasts. -she is tolerating treatment well, blood counts are stable  

## 2022-06-20 ENCOUNTER — Inpatient Hospital Stay: Payer: Medicare HMO

## 2022-06-20 ENCOUNTER — Encounter: Payer: Self-pay | Admitting: Hematology

## 2022-06-20 ENCOUNTER — Other Ambulatory Visit: Payer: Self-pay

## 2022-06-20 ENCOUNTER — Inpatient Hospital Stay (HOSPITAL_BASED_OUTPATIENT_CLINIC_OR_DEPARTMENT_OTHER): Payer: Medicare HMO | Admitting: Hematology

## 2022-06-20 VITALS — BP 114/64 | HR 86 | Temp 98.3°F | Resp 18 | Ht 63.0 in | Wt 146.4 lb

## 2022-06-20 DIAGNOSIS — Z9884 Bariatric surgery status: Secondary | ICD-10-CM | POA: Diagnosis not present

## 2022-06-20 DIAGNOSIS — E785 Hyperlipidemia, unspecified: Secondary | ICD-10-CM | POA: Diagnosis not present

## 2022-06-20 DIAGNOSIS — C9310 Chronic myelomonocytic leukemia not having achieved remission: Secondary | ICD-10-CM

## 2022-06-20 DIAGNOSIS — D693 Immune thrombocytopenic purpura: Secondary | ICD-10-CM | POA: Diagnosis not present

## 2022-06-20 DIAGNOSIS — Z7984 Long term (current) use of oral hypoglycemic drugs: Secondary | ICD-10-CM | POA: Diagnosis not present

## 2022-06-20 DIAGNOSIS — Z95828 Presence of other vascular implants and grafts: Secondary | ICD-10-CM

## 2022-06-20 DIAGNOSIS — E119 Type 2 diabetes mellitus without complications: Secondary | ICD-10-CM | POA: Diagnosis not present

## 2022-06-20 DIAGNOSIS — D696 Thrombocytopenia, unspecified: Secondary | ICD-10-CM

## 2022-06-20 DIAGNOSIS — Z5111 Encounter for antineoplastic chemotherapy: Secondary | ICD-10-CM | POA: Diagnosis not present

## 2022-06-20 DIAGNOSIS — I10 Essential (primary) hypertension: Secondary | ICD-10-CM | POA: Diagnosis not present

## 2022-06-20 DIAGNOSIS — Z79899 Other long term (current) drug therapy: Secondary | ICD-10-CM | POA: Diagnosis not present

## 2022-06-20 LAB — CBC WITH DIFFERENTIAL (CANCER CENTER ONLY)
Abs Immature Granulocytes: 0.03 10*3/uL (ref 0.00–0.07)
Basophils Absolute: 0 10*3/uL (ref 0.0–0.1)
Basophils Relative: 0 %
Eosinophils Absolute: 0 10*3/uL (ref 0.0–0.5)
Eosinophils Relative: 0 %
HCT: 27.8 % — ABNORMAL LOW (ref 36.0–46.0)
Hemoglobin: 9 g/dL — ABNORMAL LOW (ref 12.0–15.0)
Immature Granulocytes: 1 %
Lymphocytes Relative: 10 %
Lymphs Abs: 0.5 10*3/uL — ABNORMAL LOW (ref 0.7–4.0)
MCH: 26.7 pg (ref 26.0–34.0)
MCHC: 32.4 g/dL (ref 30.0–36.0)
MCV: 82.5 fL (ref 80.0–100.0)
Monocytes Absolute: 1 10*3/uL (ref 0.1–1.0)
Monocytes Relative: 21 %
Neutro Abs: 3.1 10*3/uL (ref 1.7–7.7)
Neutrophils Relative %: 68 %
Platelet Count: 72 10*3/uL — ABNORMAL LOW (ref 150–400)
RBC: 3.37 MIL/uL — ABNORMAL LOW (ref 3.87–5.11)
RDW: 23.7 % — ABNORMAL HIGH (ref 11.5–15.5)
WBC Count: 4.7 10*3/uL (ref 4.0–10.5)
nRBC: 0 % (ref 0.0–0.2)

## 2022-06-20 LAB — BASIC METABOLIC PANEL - CANCER CENTER ONLY
Anion gap: 6 (ref 5–15)
BUN: 11 mg/dL (ref 8–23)
CO2: 25 mmol/L (ref 22–32)
Calcium: 8.6 mg/dL — ABNORMAL LOW (ref 8.9–10.3)
Chloride: 110 mmol/L (ref 98–111)
Creatinine: 0.61 mg/dL (ref 0.44–1.00)
GFR, Estimated: >60 mL/min (ref >60)
Glucose, Bld: 124 mg/dL — ABNORMAL HIGH (ref 70–99)
Potassium: 4 mmol/L (ref 3.5–5.1)
Sodium: 141 mmol/L (ref 135–145)

## 2022-06-20 MED ORDER — SODIUM CHLORIDE 0.9 % IV SOLN
10.0000 mg | Freq: Once | INTRAVENOUS | Status: AC
  Administered 2022-06-20: 10 mg via INTRAVENOUS
  Filled 2022-06-20: qty 10

## 2022-06-20 MED ORDER — HEPARIN SOD (PORK) LOCK FLUSH 100 UNIT/ML IV SOLN
500.0000 [IU] | Freq: Once | INTRAVENOUS | Status: AC | PRN
  Administered 2022-06-20: 500 [IU]

## 2022-06-20 MED ORDER — SODIUM CHLORIDE 0.9 % IV SOLN: Freq: Once | INTRAVENOUS | Status: AC

## 2022-06-20 MED ORDER — SENNOSIDES-DOCUSATE SODIUM 8.6-50 MG PO TABS: 2.0000 | ORAL_TABLET | Freq: Every evening | ORAL | 1 refills | Status: DC | PRN

## 2022-06-20 MED ORDER — SODIUM CHLORIDE 0.9% FLUSH
10.0000 mL | INTRAVENOUS | Status: DC | PRN
  Administered 2022-06-20: 10 mL

## 2022-06-20 MED ORDER — SODIUM CHLORIDE 0.9 % IV SOLN
75.0000 mg/m2 | Freq: Once | INTRAVENOUS | Status: AC
  Administered 2022-06-20: 130 mg via INTRAVENOUS
  Filled 2022-06-20: qty 13

## 2022-06-20 MED ORDER — SODIUM CHLORIDE 0.9% FLUSH
10.0000 mL | Freq: Once | INTRAVENOUS | Status: AC
  Administered 2022-06-20: 10 mL

## 2022-06-20 MED ORDER — CYANOCOBALAMIN 1000 MCG/ML IJ SOLN
1000.0000 ug | Freq: Once | INTRAMUSCULAR | Status: AC
  Administered 2022-06-20: 1000 ug via INTRAMUSCULAR
  Filled 2022-06-20: qty 1

## 2022-06-20 MED FILL — Dexamethasone Sodium Phosphate Inj 100 MG/10ML: INTRAMUSCULAR | Qty: 1 | Status: AC

## 2022-06-20 NOTE — Progress Notes (Signed)
Per Dr Burr Medico ok to treat with platelets of 72 today.

## 2022-06-20 NOTE — Addendum Note (Signed)
Addended by: Truitt Merle on: 06/20/2022 11:48 PM   Modules accepted: Orders

## 2022-06-21 ENCOUNTER — Telehealth: Payer: Self-pay | Admitting: Hematology

## 2022-06-21 ENCOUNTER — Inpatient Hospital Stay: Payer: Medicare HMO

## 2022-06-21 VITALS — BP 105/60 | HR 79 | Temp 98.4°F | Resp 18

## 2022-06-21 DIAGNOSIS — C9310 Chronic myelomonocytic leukemia not having achieved remission: Secondary | ICD-10-CM | POA: Diagnosis not present

## 2022-06-21 DIAGNOSIS — E119 Type 2 diabetes mellitus without complications: Secondary | ICD-10-CM | POA: Diagnosis not present

## 2022-06-21 DIAGNOSIS — E785 Hyperlipidemia, unspecified: Secondary | ICD-10-CM | POA: Diagnosis not present

## 2022-06-21 DIAGNOSIS — I10 Essential (primary) hypertension: Secondary | ICD-10-CM | POA: Diagnosis not present

## 2022-06-21 DIAGNOSIS — Z79899 Other long term (current) drug therapy: Secondary | ICD-10-CM | POA: Diagnosis not present

## 2022-06-21 DIAGNOSIS — Z5111 Encounter for antineoplastic chemotherapy: Secondary | ICD-10-CM | POA: Diagnosis not present

## 2022-06-21 DIAGNOSIS — D693 Immune thrombocytopenic purpura: Secondary | ICD-10-CM | POA: Diagnosis not present

## 2022-06-21 DIAGNOSIS — Z7984 Long term (current) use of oral hypoglycemic drugs: Secondary | ICD-10-CM | POA: Diagnosis not present

## 2022-06-21 DIAGNOSIS — Z9884 Bariatric surgery status: Secondary | ICD-10-CM | POA: Diagnosis not present

## 2022-06-21 MED ORDER — SODIUM CHLORIDE 0.9 % IV SOLN: Freq: Once | INTRAVENOUS | Status: AC

## 2022-06-21 MED ORDER — SODIUM CHLORIDE 0.9 % IV SOLN
75.0000 mg/m2 | Freq: Once | INTRAVENOUS | Status: AC
  Administered 2022-06-21: 130 mg via INTRAVENOUS
  Filled 2022-06-21: qty 13

## 2022-06-21 MED ORDER — HEPARIN SOD (PORK) LOCK FLUSH 100 UNIT/ML IV SOLN
500.0000 [IU] | Freq: Once | INTRAVENOUS | Status: AC | PRN
  Administered 2022-06-21: 500 [IU]

## 2022-06-21 MED ORDER — SODIUM CHLORIDE 0.9% FLUSH
10.0000 mL | INTRAVENOUS | Status: DC | PRN
  Administered 2022-06-21: 10 mL

## 2022-06-21 MED ORDER — SODIUM CHLORIDE 0.9 % IV SOLN
10.0000 mg | Freq: Once | INTRAVENOUS | Status: AC
  Administered 2022-06-21: 10 mg via INTRAVENOUS
  Filled 2022-06-21: qty 10

## 2022-06-21 MED FILL — Dexamethasone Sodium Phosphate Inj 100 MG/10ML: INTRAMUSCULAR | Qty: 1 | Status: AC

## 2022-06-21 NOTE — Patient Instructions (Signed)
[facility]  Discharge Instructions: Thank you for choosing [facility] to provide your oncology and hematology care.   If you have a lab appointment with the Cancer Center, please go directly to the Cancer Center and check in at the registration area.   Wear comfortable clothing and clothing appropriate for easy access to any Portacath or PICC line.   We strive to give you quality time with your provider. You may need to reschedule your appointment if you arrive late (15 or more minutes).  Arriving late affects you and other patients whose appointments are after yours.  Also, if you miss three or more appointments without notifying the office, you may be dismissed from the clinic at the provider's discretion.      For prescription refill requests, have your pharmacy contact our office and allow 72 hours for refills to be completed.    Today you received the following chemotherapy and/or immunotherapy agents: Vidaza.       To help prevent nausea and vomiting after your treatment, we encourage you to take your nausea medication as directed.  BELOW ARE SYMPTOMS THAT SHOULD BE REPORTED IMMEDIATELY: *FEVER GREATER THAN 100.4 F (38 C) OR HIGHER *CHILLS OR SWEATING *NAUSEA AND VOMITING THAT IS NOT CONTROLLED WITH YOUR NAUSEA MEDICATION *UNUSUAL SHORTNESS OF BREATH *UNUSUAL BRUISING OR BLEEDING *URINARY PROBLEMS (pain or burning when urinating, or frequent urination) *BOWEL PROBLEMS (unusual diarrhea, constipation, pain near the anus) TENDERNESS IN MOUTH AND THROAT WITH OR WITHOUT PRESENCE OF ULCERS (sore throat, sores in mouth, or a toothache) UNUSUAL RASH, SWELLING OR PAIN  UNUSUAL VAGINAL DISCHARGE OR ITCHING   Items with * indicate a potential emergency and should be followed up as soon as possible or go to the Emergency Department if any problems should occur.  Please show the CHEMOTHERAPY ALERT CARD or IMMUNOTHERAPY ALERT CARD at check-in to  the Emergency Department and triage nurse.  Should you have questions after your visit or need to cancel or reschedule your appointment, please contact [facility]  Dept: [phone]  and follow the prompts.  Office hours are 8:00 a.m. to 4:30 p.m. Monday - Friday. Please note that voicemails left after 4:00 p.m. may not be returned until the following business day.  We are closed weekends and major holidays. You have access to a nurse at all times for urgent questions. Please call the main number to the clinic Dept: [phone] and follow the prompts.   For any non-urgent questions, you may also contact your provider using MyChart. We now offer e-Visits for anyone 18 and older to request care online for non-urgent symptoms. For details visit [email or web address].   Also download the MyChart app! Go to the app store, search "MyChart", open the app, select [facility], and log in with your MyChart username and password.   

## 2022-06-21 NOTE — Telephone Encounter (Signed)
Patient got updated calender while in infusion

## 2022-06-22 ENCOUNTER — Inpatient Hospital Stay: Payer: Medicare HMO

## 2022-06-22 ENCOUNTER — Other Ambulatory Visit: Payer: Medicare HMO

## 2022-06-22 VITALS — BP 120/61 | HR 89 | Temp 98.2°F | Resp 18

## 2022-06-22 DIAGNOSIS — Z79899 Other long term (current) drug therapy: Secondary | ICD-10-CM | POA: Diagnosis not present

## 2022-06-22 DIAGNOSIS — Z5111 Encounter for antineoplastic chemotherapy: Secondary | ICD-10-CM | POA: Diagnosis not present

## 2022-06-22 DIAGNOSIS — Z7984 Long term (current) use of oral hypoglycemic drugs: Secondary | ICD-10-CM | POA: Diagnosis not present

## 2022-06-22 DIAGNOSIS — I10 Essential (primary) hypertension: Secondary | ICD-10-CM | POA: Diagnosis not present

## 2022-06-22 DIAGNOSIS — D696 Thrombocytopenia, unspecified: Secondary | ICD-10-CM

## 2022-06-22 DIAGNOSIS — E785 Hyperlipidemia, unspecified: Secondary | ICD-10-CM | POA: Diagnosis not present

## 2022-06-22 DIAGNOSIS — E119 Type 2 diabetes mellitus without complications: Secondary | ICD-10-CM | POA: Diagnosis not present

## 2022-06-22 DIAGNOSIS — C9310 Chronic myelomonocytic leukemia not having achieved remission: Secondary | ICD-10-CM | POA: Diagnosis not present

## 2022-06-22 DIAGNOSIS — Z9884 Bariatric surgery status: Secondary | ICD-10-CM | POA: Diagnosis not present

## 2022-06-22 DIAGNOSIS — D693 Immune thrombocytopenic purpura: Secondary | ICD-10-CM | POA: Diagnosis not present

## 2022-06-22 DIAGNOSIS — Z95828 Presence of other vascular implants and grafts: Secondary | ICD-10-CM

## 2022-06-22 MED ORDER — SODIUM CHLORIDE 0.9% FLUSH
10.0000 mL | INTRAVENOUS | Status: DC | PRN
  Administered 2022-06-22: 10 mL

## 2022-06-22 MED ORDER — SODIUM CHLORIDE 0.9 % IV SOLN
10.0000 mg | Freq: Once | INTRAVENOUS | Status: AC
  Administered 2022-06-22: 10 mg via INTRAVENOUS
  Filled 2022-06-22: qty 10

## 2022-06-22 MED ORDER — HEPARIN SOD (PORK) LOCK FLUSH 100 UNIT/ML IV SOLN
500.0000 [IU] | Freq: Once | INTRAVENOUS | Status: AC | PRN
  Administered 2022-06-22: 500 [IU]

## 2022-06-22 MED ORDER — SODIUM CHLORIDE 0.9 % IV SOLN: Freq: Once | INTRAVENOUS | Status: AC

## 2022-06-22 MED ORDER — SODIUM CHLORIDE 0.9 % IV SOLN
75.0000 mg/m2 | Freq: Once | INTRAVENOUS | Status: AC
  Administered 2022-06-22: 130 mg via INTRAVENOUS
  Filled 2022-06-22: qty 13

## 2022-06-22 MED ORDER — ROMIPLOSTIM INJECTION 500 MCG
550.0000 ug | Freq: Once | SUBCUTANEOUS | Status: AC
  Administered 2022-06-22: 550 ug via SUBCUTANEOUS
  Filled 2022-06-22: qty 1

## 2022-06-22 MED FILL — Dexamethasone Sodium Phosphate Inj 100 MG/10ML: INTRAMUSCULAR | Qty: 1 | Status: AC

## 2022-06-22 NOTE — Patient Instructions (Signed)
[facility]  Discharge Instructions: Thank you for choosing [facility] to provide your oncology and hematology care.   If you have a lab appointment with the Cancer Center, please go directly to the Cancer Center and check in at the registration area.   Wear comfortable clothing and clothing appropriate for easy access to any Portacath or PICC line.   We strive to give you quality time with your provider. You may need to reschedule your appointment if you arrive late (15 or more minutes).  Arriving late affects you and other patients whose appointments are after yours.  Also, if you miss three or more appointments without notifying the office, you may be dismissed from the clinic at the provider's discretion.      For prescription refill requests, have your pharmacy contact our office and allow 72 hours for refills to be completed.    Today you received the following chemotherapy and/or immunotherapy agents: Vidaza.       To help prevent nausea and vomiting after your treatment, we encourage you to take your nausea medication as directed.  BELOW ARE SYMPTOMS THAT SHOULD BE REPORTED IMMEDIATELY: *FEVER GREATER THAN 100.4 F (38 C) OR HIGHER *CHILLS OR SWEATING *NAUSEA AND VOMITING THAT IS NOT CONTROLLED WITH YOUR NAUSEA MEDICATION *UNUSUAL SHORTNESS OF BREATH *UNUSUAL BRUISING OR BLEEDING *URINARY PROBLEMS (pain or burning when urinating, or frequent urination) *BOWEL PROBLEMS (unusual diarrhea, constipation, pain near the anus) TENDERNESS IN MOUTH AND THROAT WITH OR WITHOUT PRESENCE OF ULCERS (sore throat, sores in mouth, or a toothache) UNUSUAL RASH, SWELLING OR PAIN  UNUSUAL VAGINAL DISCHARGE OR ITCHING   Items with * indicate a potential emergency and should be followed up as soon as possible or go to the Emergency Department if any problems should occur.  Please show the CHEMOTHERAPY ALERT CARD or IMMUNOTHERAPY ALERT CARD at check-in to  the Emergency Department and triage nurse.  Should you have questions after your visit or need to cancel or reschedule your appointment, please contact [facility]  Dept: [phone]  and follow the prompts.  Office hours are 8:00 a.m. to 4:30 p.m. Monday - Friday. Please note that voicemails left after 4:00 p.m. may not be returned until the following business day.  We are closed weekends and major holidays. You have access to a nurse at all times for urgent questions. Please call the main number to the clinic Dept: [phone] and follow the prompts.   For any non-urgent questions, you may also contact your provider using MyChart. We now offer e-Visits for anyone 18 and older to request care online for non-urgent symptoms. For details visit [email or web address].   Also download the MyChart app! Go to the app store, search "MyChart", open the app, select [facility], and log in with your MyChart username and password.   

## 2022-06-23 ENCOUNTER — Inpatient Hospital Stay: Payer: Medicare HMO

## 2022-06-23 VITALS — BP 118/58 | HR 72 | Temp 98.2°F | Resp 18

## 2022-06-23 DIAGNOSIS — I10 Essential (primary) hypertension: Secondary | ICD-10-CM | POA: Diagnosis not present

## 2022-06-23 DIAGNOSIS — Z7984 Long term (current) use of oral hypoglycemic drugs: Secondary | ICD-10-CM | POA: Diagnosis not present

## 2022-06-23 DIAGNOSIS — Z5111 Encounter for antineoplastic chemotherapy: Secondary | ICD-10-CM | POA: Diagnosis not present

## 2022-06-23 DIAGNOSIS — C9310 Chronic myelomonocytic leukemia not having achieved remission: Secondary | ICD-10-CM | POA: Diagnosis not present

## 2022-06-23 DIAGNOSIS — D696 Thrombocytopenia, unspecified: Secondary | ICD-10-CM

## 2022-06-23 DIAGNOSIS — E785 Hyperlipidemia, unspecified: Secondary | ICD-10-CM | POA: Diagnosis not present

## 2022-06-23 DIAGNOSIS — D693 Immune thrombocytopenic purpura: Secondary | ICD-10-CM | POA: Diagnosis not present

## 2022-06-23 DIAGNOSIS — Z95828 Presence of other vascular implants and grafts: Secondary | ICD-10-CM

## 2022-06-23 DIAGNOSIS — E119 Type 2 diabetes mellitus without complications: Secondary | ICD-10-CM | POA: Diagnosis not present

## 2022-06-23 DIAGNOSIS — Z79899 Other long term (current) drug therapy: Secondary | ICD-10-CM | POA: Diagnosis not present

## 2022-06-23 DIAGNOSIS — Z9884 Bariatric surgery status: Secondary | ICD-10-CM | POA: Diagnosis not present

## 2022-06-23 MED ORDER — SODIUM CHLORIDE 0.9 % IV SOLN: Freq: Once | INTRAVENOUS | Status: AC

## 2022-06-23 MED ORDER — SODIUM CHLORIDE 0.9 % IV SOLN
75.0000 mg/m2 | Freq: Once | INTRAVENOUS | Status: AC
  Administered 2022-06-23: 130 mg via INTRAVENOUS
  Filled 2022-06-23: qty 13

## 2022-06-23 MED ORDER — HEPARIN SOD (PORK) LOCK FLUSH 100 UNIT/ML IV SOLN
500.0000 [IU] | Freq: Once | INTRAVENOUS | Status: AC
  Administered 2022-06-23: 500 [IU]

## 2022-06-23 MED ORDER — SODIUM CHLORIDE 0.9 % IV SOLN
10.0000 mg | Freq: Once | INTRAVENOUS | Status: AC
  Administered 2022-06-23: 10 mg via INTRAVENOUS
  Filled 2022-06-23: qty 10

## 2022-06-23 MED ORDER — SODIUM CHLORIDE 0.9% FLUSH
10.0000 mL | Freq: Once | INTRAVENOUS | Status: AC
  Administered 2022-06-23: 10 mL

## 2022-06-23 MED FILL — Dexamethasone Sodium Phosphate Inj 100 MG/10ML: INTRAMUSCULAR | Qty: 1 | Status: AC

## 2022-06-24 ENCOUNTER — Inpatient Hospital Stay: Payer: Medicare HMO

## 2022-06-24 VITALS — BP 124/60 | HR 74 | Temp 98.4°F | Resp 16

## 2022-06-24 DIAGNOSIS — C9310 Chronic myelomonocytic leukemia not having achieved remission: Secondary | ICD-10-CM | POA: Diagnosis not present

## 2022-06-24 DIAGNOSIS — E785 Hyperlipidemia, unspecified: Secondary | ICD-10-CM | POA: Diagnosis not present

## 2022-06-24 DIAGNOSIS — I10 Essential (primary) hypertension: Secondary | ICD-10-CM | POA: Diagnosis not present

## 2022-06-24 DIAGNOSIS — Z7984 Long term (current) use of oral hypoglycemic drugs: Secondary | ICD-10-CM | POA: Diagnosis not present

## 2022-06-24 DIAGNOSIS — E119 Type 2 diabetes mellitus without complications: Secondary | ICD-10-CM | POA: Diagnosis not present

## 2022-06-24 DIAGNOSIS — Z5111 Encounter for antineoplastic chemotherapy: Secondary | ICD-10-CM | POA: Diagnosis not present

## 2022-06-24 DIAGNOSIS — Z79899 Other long term (current) drug therapy: Secondary | ICD-10-CM | POA: Diagnosis not present

## 2022-06-24 DIAGNOSIS — Z9884 Bariatric surgery status: Secondary | ICD-10-CM | POA: Diagnosis not present

## 2022-06-24 DIAGNOSIS — D693 Immune thrombocytopenic purpura: Secondary | ICD-10-CM | POA: Diagnosis not present

## 2022-06-24 MED ORDER — SODIUM CHLORIDE 0.9% FLUSH
10.0000 mL | INTRAVENOUS | Status: DC | PRN
  Administered 2022-06-24: 10 mL

## 2022-06-24 MED ORDER — SODIUM CHLORIDE 0.9 % IV SOLN
75.0000 mg/m2 | Freq: Once | INTRAVENOUS | Status: AC
  Administered 2022-06-24: 130 mg via INTRAVENOUS
  Filled 2022-06-24: qty 13

## 2022-06-24 MED ORDER — SODIUM CHLORIDE 0.9 % IV SOLN: Freq: Once | INTRAVENOUS | Status: AC

## 2022-06-24 MED ORDER — SODIUM CHLORIDE 0.9 % IV SOLN
10.0000 mg | Freq: Once | INTRAVENOUS | Status: AC
  Administered 2022-06-24: 10 mg via INTRAVENOUS
  Filled 2022-06-24: qty 10

## 2022-06-24 MED ORDER — HEPARIN SOD (PORK) LOCK FLUSH 100 UNIT/ML IV SOLN
500.0000 [IU] | Freq: Once | INTRAVENOUS | Status: AC | PRN
  Administered 2022-06-24: 500 [IU]

## 2022-06-24 NOTE — Patient Instructions (Signed)
[facility]  Discharge Instructions: Thank you for choosing [facility] to provide your oncology and hematology care.   If you have a lab appointment with the Cancer Center, please go directly to the Cancer Center and check in at the registration area.   Wear comfortable clothing and clothing appropriate for easy access to any Portacath or PICC line.   We strive to give you quality time with your provider. You may need to reschedule your appointment if you arrive late (15 or more minutes).  Arriving late affects you and other patients whose appointments are after yours.  Also, if you miss three or more appointments without notifying the office, you may be dismissed from the clinic at the provider's discretion.      For prescription refill requests, have your pharmacy contact our office and allow 72 hours for refills to be completed.    Today you received the following chemotherapy and/or immunotherapy agents: Vidaza.       To help prevent nausea and vomiting after your treatment, we encourage you to take your nausea medication as directed.  BELOW ARE SYMPTOMS THAT SHOULD BE REPORTED IMMEDIATELY: *FEVER GREATER THAN 100.4 F (38 C) OR HIGHER *CHILLS OR SWEATING *NAUSEA AND VOMITING THAT IS NOT CONTROLLED WITH YOUR NAUSEA MEDICATION *UNUSUAL SHORTNESS OF BREATH *UNUSUAL BRUISING OR BLEEDING *URINARY PROBLEMS (pain or burning when urinating, or frequent urination) *BOWEL PROBLEMS (unusual diarrhea, constipation, pain near the anus) TENDERNESS IN MOUTH AND THROAT WITH OR WITHOUT PRESENCE OF ULCERS (sore throat, sores in mouth, or a toothache) UNUSUAL RASH, SWELLING OR PAIN  UNUSUAL VAGINAL DISCHARGE OR ITCHING   Items with * indicate a potential emergency and should be followed up as soon as possible or go to the Emergency Department if any problems should occur.  Please show the CHEMOTHERAPY ALERT CARD or IMMUNOTHERAPY ALERT CARD at check-in to  the Emergency Department and triage nurse.  Should you have questions after your visit or need to cancel or reschedule your appointment, please contact [facility]  Dept: [phone]  and follow the prompts.  Office hours are 8:00 a.m. to 4:30 p.m. Monday - Friday. Please note that voicemails left after 4:00 p.m. may not be returned until the following business day.  We are closed weekends and major holidays. You have access to a nurse at all times for urgent questions. Please call the main number to the clinic Dept: [phone] and follow the prompts.   For any non-urgent questions, you may also contact your provider using MyChart. We now offer e-Visits for anyone 18 and older to request care online for non-urgent symptoms. For details visit [email or web address].   Also download the MyChart app! Go to the app store, search "MyChart", open the app, select [facility], and log in with your MyChart username and password.   

## 2022-06-29 ENCOUNTER — Inpatient Hospital Stay: Payer: Medicare HMO

## 2022-06-29 DIAGNOSIS — E785 Hyperlipidemia, unspecified: Secondary | ICD-10-CM | POA: Diagnosis not present

## 2022-06-29 DIAGNOSIS — E119 Type 2 diabetes mellitus without complications: Secondary | ICD-10-CM | POA: Diagnosis not present

## 2022-06-29 DIAGNOSIS — D693 Immune thrombocytopenic purpura: Secondary | ICD-10-CM | POA: Diagnosis not present

## 2022-06-29 DIAGNOSIS — I10 Essential (primary) hypertension: Secondary | ICD-10-CM | POA: Diagnosis not present

## 2022-06-29 DIAGNOSIS — C9310 Chronic myelomonocytic leukemia not having achieved remission: Secondary | ICD-10-CM

## 2022-06-29 DIAGNOSIS — Z7984 Long term (current) use of oral hypoglycemic drugs: Secondary | ICD-10-CM | POA: Diagnosis not present

## 2022-06-29 DIAGNOSIS — Z95828 Presence of other vascular implants and grafts: Secondary | ICD-10-CM

## 2022-06-29 DIAGNOSIS — D696 Thrombocytopenia, unspecified: Secondary | ICD-10-CM

## 2022-06-29 DIAGNOSIS — Z5111 Encounter for antineoplastic chemotherapy: Secondary | ICD-10-CM | POA: Diagnosis not present

## 2022-06-29 DIAGNOSIS — Z79899 Other long term (current) drug therapy: Secondary | ICD-10-CM | POA: Diagnosis not present

## 2022-06-29 DIAGNOSIS — Z9884 Bariatric surgery status: Secondary | ICD-10-CM | POA: Diagnosis not present

## 2022-06-29 LAB — CBC WITH DIFFERENTIAL (CANCER CENTER ONLY)
Abs Immature Granulocytes: 0.22 10*3/uL — ABNORMAL HIGH (ref 0.00–0.07)
Basophils Absolute: 0.1 10*3/uL (ref 0.0–0.1)
Basophils Relative: 1 %
Eosinophils Absolute: 0.1 10*3/uL (ref 0.0–0.5)
Eosinophils Relative: 2 %
HCT: 29.4 % — ABNORMAL LOW (ref 36.0–46.0)
Hemoglobin: 9.4 g/dL — ABNORMAL LOW (ref 12.0–15.0)
Immature Granulocytes: 4 %
Lymphocytes Relative: 10 %
Lymphs Abs: 0.5 10*3/uL — ABNORMAL LOW (ref 0.7–4.0)
MCH: 26.3 pg (ref 26.0–34.0)
MCHC: 32 g/dL (ref 30.0–36.0)
MCV: 82.1 fL (ref 80.0–100.0)
Monocytes Absolute: 0.6 10*3/uL (ref 0.1–1.0)
Monocytes Relative: 12 %
Neutro Abs: 3.8 10*3/uL (ref 1.7–7.7)
Neutrophils Relative %: 71 %
Platelet Count: 50 10*3/uL — ABNORMAL LOW (ref 150–400)
RBC: 3.58 MIL/uL — ABNORMAL LOW (ref 3.87–5.11)
RDW: 23.3 % — ABNORMAL HIGH (ref 11.5–15.5)
WBC Count: 5.3 10*3/uL (ref 4.0–10.5)
nRBC: 0 % (ref 0.0–0.2)

## 2022-06-29 LAB — COMPREHENSIVE METABOLIC PANEL
ALT: 23 U/L (ref 0–44)
AST: 20 U/L (ref 15–41)
Albumin: 3.1 g/dL — ABNORMAL LOW (ref 3.5–5.0)
Alkaline Phosphatase: 98 U/L (ref 38–126)
Anion gap: 3 — ABNORMAL LOW (ref 5–15)
BUN: 13 mg/dL (ref 8–23)
CO2: 27 mmol/L (ref 22–32)
Calcium: 8.5 mg/dL — ABNORMAL LOW (ref 8.9–10.3)
Chloride: 111 mmol/L (ref 98–111)
Creatinine, Ser: 0.6 mg/dL (ref 0.44–1.00)
GFR, Estimated: >60 mL/min (ref >60)
Glucose, Bld: 168 mg/dL — ABNORMAL HIGH (ref 70–99)
Potassium: 4 mmol/L (ref 3.5–5.1)
Sodium: 141 mmol/L (ref 135–145)
Total Bilirubin: 1.3 mg/dL — ABNORMAL HIGH (ref 0.3–1.2)
Total Protein: 6.1 g/dL — ABNORMAL LOW (ref 6.5–8.1)

## 2022-06-29 MED ORDER — SODIUM CHLORIDE 0.9% FLUSH
10.0000 mL | Freq: Once | INTRAVENOUS | Status: AC
  Administered 2022-06-29: 10 mL

## 2022-06-29 MED ORDER — HEPARIN SOD (PORK) LOCK FLUSH 100 UNIT/ML IV SOLN
500.0000 [IU] | Freq: Once | INTRAVENOUS | Status: AC
  Administered 2022-06-29: 500 [IU]

## 2022-06-29 MED ORDER — ROMIPLOSTIM INJECTION 500 MCG
550.0000 ug | Freq: Once | SUBCUTANEOUS | Status: AC
  Administered 2022-06-29: 550 ug via SUBCUTANEOUS
  Filled 2022-06-29: qty 1

## 2022-07-01 ENCOUNTER — Other Ambulatory Visit: Payer: Self-pay

## 2022-07-06 ENCOUNTER — Other Ambulatory Visit: Payer: Self-pay

## 2022-07-06 ENCOUNTER — Inpatient Hospital Stay: Payer: Medicare HMO

## 2022-07-06 VITALS — BP 124/68 | HR 72 | Temp 98.2°F | Resp 18

## 2022-07-06 DIAGNOSIS — Z5111 Encounter for antineoplastic chemotherapy: Secondary | ICD-10-CM | POA: Diagnosis not present

## 2022-07-06 DIAGNOSIS — C9310 Chronic myelomonocytic leukemia not having achieved remission: Secondary | ICD-10-CM | POA: Diagnosis not present

## 2022-07-06 DIAGNOSIS — Z9884 Bariatric surgery status: Secondary | ICD-10-CM | POA: Diagnosis not present

## 2022-07-06 DIAGNOSIS — Z79899 Other long term (current) drug therapy: Secondary | ICD-10-CM | POA: Diagnosis not present

## 2022-07-06 DIAGNOSIS — E119 Type 2 diabetes mellitus without complications: Secondary | ICD-10-CM | POA: Diagnosis not present

## 2022-07-06 DIAGNOSIS — Z95828 Presence of other vascular implants and grafts: Secondary | ICD-10-CM

## 2022-07-06 DIAGNOSIS — D693 Immune thrombocytopenic purpura: Secondary | ICD-10-CM | POA: Diagnosis not present

## 2022-07-06 DIAGNOSIS — Z7984 Long term (current) use of oral hypoglycemic drugs: Secondary | ICD-10-CM | POA: Diagnosis not present

## 2022-07-06 DIAGNOSIS — E785 Hyperlipidemia, unspecified: Secondary | ICD-10-CM | POA: Diagnosis not present

## 2022-07-06 DIAGNOSIS — I10 Essential (primary) hypertension: Secondary | ICD-10-CM | POA: Diagnosis not present

## 2022-07-06 DIAGNOSIS — D696 Thrombocytopenia, unspecified: Secondary | ICD-10-CM

## 2022-07-06 LAB — CBC WITH DIFFERENTIAL (CANCER CENTER ONLY)
Abs Immature Granulocytes: 0.22 10*3/uL — ABNORMAL HIGH (ref 0.00–0.07)
Basophils Absolute: 0.1 10*3/uL (ref 0.0–0.1)
Basophils Relative: 1 %
Eosinophils Absolute: 0.1 10*3/uL (ref 0.0–0.5)
Eosinophils Relative: 1 %
HCT: 31 % — ABNORMAL LOW (ref 36.0–46.0)
Hemoglobin: 10 g/dL — ABNORMAL LOW (ref 12.0–15.0)
Immature Granulocytes: 3 %
Lymphocytes Relative: 9 %
Lymphs Abs: 0.7 10*3/uL (ref 0.7–4.0)
MCH: 26.5 pg (ref 26.0–34.0)
MCHC: 32.3 g/dL (ref 30.0–36.0)
MCV: 82.2 fL (ref 80.0–100.0)
Monocytes Absolute: 1.5 10*3/uL — ABNORMAL HIGH (ref 0.1–1.0)
Monocytes Relative: 19 %
Neutro Abs: 5.6 10*3/uL (ref 1.7–7.7)
Neutrophils Relative %: 67 %
Platelet Count: 65 10*3/uL — ABNORMAL LOW (ref 150–400)
RBC: 3.77 MIL/uL — ABNORMAL LOW (ref 3.87–5.11)
RDW: 23.9 % — ABNORMAL HIGH (ref 11.5–15.5)
WBC Count: 8.2 10*3/uL (ref 4.0–10.5)
nRBC: 0 % (ref 0.0–0.2)

## 2022-07-06 MED ORDER — ROMIPLOSTIM INJECTION 500 MCG
550.0000 ug | Freq: Once | SUBCUTANEOUS | Status: AC
  Administered 2022-07-06: 550 ug via SUBCUTANEOUS
  Filled 2022-07-06: qty 1

## 2022-07-08 ENCOUNTER — Telehealth: Payer: Self-pay | Admitting: Hematology

## 2022-07-08 NOTE — Telephone Encounter (Signed)
Left patient a vm regarding appointment change 1/31

## 2022-07-13 ENCOUNTER — Inpatient Hospital Stay: Payer: Medicare HMO

## 2022-07-13 ENCOUNTER — Other Ambulatory Visit: Payer: Self-pay

## 2022-07-13 VITALS — BP 120/57 | HR 91 | Temp 98.4°F | Resp 16

## 2022-07-13 DIAGNOSIS — Z7984 Long term (current) use of oral hypoglycemic drugs: Secondary | ICD-10-CM | POA: Diagnosis not present

## 2022-07-13 DIAGNOSIS — E785 Hyperlipidemia, unspecified: Secondary | ICD-10-CM | POA: Diagnosis not present

## 2022-07-13 DIAGNOSIS — E119 Type 2 diabetes mellitus without complications: Secondary | ICD-10-CM | POA: Diagnosis not present

## 2022-07-13 DIAGNOSIS — D696 Thrombocytopenia, unspecified: Secondary | ICD-10-CM

## 2022-07-13 DIAGNOSIS — Z95828 Presence of other vascular implants and grafts: Secondary | ICD-10-CM

## 2022-07-13 DIAGNOSIS — C9310 Chronic myelomonocytic leukemia not having achieved remission: Secondary | ICD-10-CM

## 2022-07-13 DIAGNOSIS — Z9884 Bariatric surgery status: Secondary | ICD-10-CM | POA: Diagnosis not present

## 2022-07-13 DIAGNOSIS — D693 Immune thrombocytopenic purpura: Secondary | ICD-10-CM | POA: Diagnosis not present

## 2022-07-13 DIAGNOSIS — I10 Essential (primary) hypertension: Secondary | ICD-10-CM | POA: Diagnosis not present

## 2022-07-13 DIAGNOSIS — Z79899 Other long term (current) drug therapy: Secondary | ICD-10-CM | POA: Diagnosis not present

## 2022-07-13 DIAGNOSIS — Z5111 Encounter for antineoplastic chemotherapy: Secondary | ICD-10-CM | POA: Diagnosis not present

## 2022-07-13 LAB — COMPREHENSIVE METABOLIC PANEL
ALT: 23 U/L (ref 0–44)
AST: 23 U/L (ref 15–41)
Albumin: 3.6 g/dL (ref 3.5–5.0)
Alkaline Phosphatase: 124 U/L (ref 38–126)
Anion gap: 7 (ref 5–15)
BUN: 17 mg/dL (ref 8–23)
CO2: 25 mmol/L (ref 22–32)
Calcium: 8.8 mg/dL — ABNORMAL LOW (ref 8.9–10.3)
Chloride: 108 mmol/L (ref 98–111)
Creatinine, Ser: 0.64 mg/dL (ref 0.44–1.00)
GFR, Estimated: >60 mL/min (ref >60)
Glucose, Bld: 280 mg/dL — ABNORMAL HIGH (ref 70–99)
Potassium: 4.4 mmol/L (ref 3.5–5.1)
Sodium: 140 mmol/L (ref 135–145)
Total Bilirubin: 1.4 mg/dL — ABNORMAL HIGH (ref 0.3–1.2)
Total Protein: 7.2 g/dL (ref 6.5–8.1)

## 2022-07-13 LAB — CBC WITH DIFFERENTIAL (CANCER CENTER ONLY)
Abs Immature Granulocytes: 0.04 10*3/uL (ref 0.00–0.07)
Basophils Absolute: 0 10*3/uL (ref 0.0–0.1)
Basophils Relative: 1 %
Eosinophils Absolute: 0 10*3/uL (ref 0.0–0.5)
Eosinophils Relative: 1 %
HCT: 31.9 % — ABNORMAL LOW (ref 36.0–46.0)
Hemoglobin: 10.4 g/dL — ABNORMAL LOW (ref 12.0–15.0)
Immature Granulocytes: 1 %
Lymphocytes Relative: 10 %
Lymphs Abs: 0.6 10*3/uL — ABNORMAL LOW (ref 0.7–4.0)
MCH: 27.2 pg (ref 26.0–34.0)
MCHC: 32.6 g/dL (ref 30.0–36.0)
MCV: 83.5 fL (ref 80.0–100.0)
Monocytes Absolute: 0.9 10*3/uL (ref 0.1–1.0)
Monocytes Relative: 17 %
Neutro Abs: 3.8 10*3/uL (ref 1.7–7.7)
Neutrophils Relative %: 70 %
Platelet Count: 70 10*3/uL — ABNORMAL LOW (ref 150–400)
RBC: 3.82 MIL/uL — ABNORMAL LOW (ref 3.87–5.11)
RDW: 24.3 % — ABNORMAL HIGH (ref 11.5–15.5)
WBC Count: 5.4 10*3/uL (ref 4.0–10.5)
nRBC: 0 % (ref 0.0–0.2)

## 2022-07-13 MED ORDER — SODIUM CHLORIDE 0.9% FLUSH
10.0000 mL | Freq: Once | INTRAVENOUS | Status: AC
  Administered 2022-07-13: 10 mL

## 2022-07-13 MED ORDER — HEPARIN SOD (PORK) LOCK FLUSH 100 UNIT/ML IV SOLN
500.0000 [IU] | Freq: Once | INTRAVENOUS | Status: AC
  Administered 2022-07-13: 500 [IU]

## 2022-07-13 MED ORDER — ROMIPLOSTIM INJECTION 500 MCG
550.0000 ug | Freq: Once | SUBCUTANEOUS | Status: AC
  Administered 2022-07-13: 550 ug via SUBCUTANEOUS
  Filled 2022-07-13: qty 1.1

## 2022-07-13 NOTE — Patient Instructions (Signed)
Romiplostim Injection What is this medication? ROMIPLOSTIM (roe mi PLOE stim) treats low levels of platelets in your body caused by immune thrombocytopenia (ITP). It is prescribed when other medications have not worked or cannot be tolerated. It may also be used to help people who have been exposed to high doses of radiation. It works by increasing the amount of platelets in your blood. This lowers the risk of bleeding. This medicine may be used for other purposes; ask your health care provider or pharmacist if you have questions. COMMON BRAND NAME(S): Nplate What should I tell my care team before I take this medication? They need to know if you have any of these conditions: Blood clots Myelodysplastic syndrome An unusual or allergic reaction to romiplostim, mannitol, other medications, foods, dyes, or preservatives Pregnant or trying to get pregnant Breast-feeding How should I use this medication? This medication is injected under the skin. It is given by a care team in a hospital or clinic setting. A special MedGuide will be given to you before each treatment. Be sure to read this information carefully each time. Talk to your care team about the use of this medication in children. While it may be prescribed for children as young as newborns for selected conditions, precautions do apply. Overdosage: If you think you have taken too much of this medicine contact a poison control center or emergency room at once. NOTE: This medicine is only for you. Do not share this medicine with others. What if I miss a dose? Keep appointments for follow-up doses. It is important not to miss your dose. Call your care team if you are unable to keep an appointment. What may interact with this medication? Interactions are not expected. This list may not describe all possible interactions. Give your health care provider a list of all the medicines, herbs, non-prescription drugs, or dietary supplements you use. Also  tell them if you smoke, drink alcohol, or use illegal drugs. Some items may interact with your medicine. What should I watch for while using this medication? Visit your care team for regular checks on your progress. You may need blood work done while you are taking this medication. Your condition will be monitored carefully while you are receiving this medication. It is important not to miss any appointments. What side effects may I notice from receiving this medication? Side effects that you should report to your care team as soon as possible: Allergic reactions--skin rash, itching, hives, swelling of the face, lips, tongue, or throat Blood clot--pain, swelling, or warmth in the leg, shortness of breath, chest pain Side effects that usually do not require medical attention (report to your care team if they continue or are bothersome): Dizziness Joint pain Muscle pain Pain in the hands or feet Stomach pain Trouble sleeping This list may not describe all possible side effects. Call your doctor for medical advice about side effects. You may report side effects to FDA at 1-800-FDA-1088. Where should I keep my medication? This medication is given in a hospital or clinic. It will not be stored at home. NOTE: This sheet is a summary. It may not cover all possible information. If you have questions about this medicine, talk to your doctor, pharmacist, or health care provider.  2023 Elsevier/Gold Standard (2021-09-07 00:00:00)

## 2022-07-15 MED FILL — Dexamethasone Sodium Phosphate Inj 100 MG/10ML: INTRAMUSCULAR | Qty: 1 | Status: AC

## 2022-07-17 NOTE — Assessment & Plan Note (Signed)
-  diagnosed in 10/2020, presented with severe thrombocytopenia  --She began azacitidine daily days 1-5 q. 28 days on 12/28/20 -She had worsening thrombocytopenia after stopping Nplate, restarted  -Bone marrow biopsy 03/18/21 showed stable disease, no increase in blasts. -she is tolerating treatment well, blood counts are stable  

## 2022-07-18 ENCOUNTER — Inpatient Hospital Stay: Payer: Medicare HMO

## 2022-07-18 ENCOUNTER — Inpatient Hospital Stay: Payer: Medicare HMO | Admitting: Hematology

## 2022-07-18 ENCOUNTER — Encounter: Payer: Self-pay | Admitting: Hematology

## 2022-07-18 ENCOUNTER — Other Ambulatory Visit: Payer: Self-pay

## 2022-07-18 ENCOUNTER — Inpatient Hospital Stay: Payer: Medicare HMO | Attending: Hematology

## 2022-07-18 ENCOUNTER — Inpatient Hospital Stay (HOSPITAL_BASED_OUTPATIENT_CLINIC_OR_DEPARTMENT_OTHER): Payer: Medicare HMO | Admitting: Hematology

## 2022-07-18 VITALS — BP 103/54 | HR 77 | Temp 98.2°F | Resp 18

## 2022-07-18 VITALS — BP 113/63 | HR 83 | Temp 98.6°F | Resp 18 | Ht 63.0 in | Wt 143.0 lb

## 2022-07-18 DIAGNOSIS — Z9884 Bariatric surgery status: Secondary | ICD-10-CM | POA: Diagnosis not present

## 2022-07-18 DIAGNOSIS — Z5111 Encounter for antineoplastic chemotherapy: Secondary | ICD-10-CM | POA: Insufficient documentation

## 2022-07-18 DIAGNOSIS — Z7984 Long term (current) use of oral hypoglycemic drugs: Secondary | ICD-10-CM | POA: Diagnosis not present

## 2022-07-18 DIAGNOSIS — C9310 Chronic myelomonocytic leukemia not having achieved remission: Secondary | ICD-10-CM

## 2022-07-18 DIAGNOSIS — R5383 Other fatigue: Secondary | ICD-10-CM | POA: Diagnosis not present

## 2022-07-18 DIAGNOSIS — D696 Thrombocytopenia, unspecified: Secondary | ICD-10-CM

## 2022-07-18 DIAGNOSIS — D693 Immune thrombocytopenic purpura: Secondary | ICD-10-CM | POA: Diagnosis present

## 2022-07-18 DIAGNOSIS — I10 Essential (primary) hypertension: Secondary | ICD-10-CM | POA: Diagnosis not present

## 2022-07-18 DIAGNOSIS — Z95828 Presence of other vascular implants and grafts: Secondary | ICD-10-CM

## 2022-07-18 DIAGNOSIS — E119 Type 2 diabetes mellitus without complications: Secondary | ICD-10-CM | POA: Diagnosis not present

## 2022-07-18 DIAGNOSIS — Z79899 Other long term (current) drug therapy: Secondary | ICD-10-CM | POA: Insufficient documentation

## 2022-07-18 DIAGNOSIS — E538 Deficiency of other specified B group vitamins: Secondary | ICD-10-CM

## 2022-07-18 LAB — CBC WITH DIFFERENTIAL (CANCER CENTER ONLY)
Abs Immature Granulocytes: 0.03 10*3/uL (ref 0.00–0.07)
Basophils Absolute: 0 10*3/uL (ref 0.0–0.1)
Basophils Relative: 1 %
Eosinophils Absolute: 0 10*3/uL (ref 0.0–0.5)
Eosinophils Relative: 1 %
HCT: 28 % — ABNORMAL LOW (ref 36.0–46.0)
Hemoglobin: 9 g/dL — ABNORMAL LOW (ref 12.0–15.0)
Immature Granulocytes: 1 %
Lymphocytes Relative: 13 %
Lymphs Abs: 0.5 10*3/uL — ABNORMAL LOW (ref 0.7–4.0)
MCH: 26.9 pg (ref 26.0–34.0)
MCHC: 32.1 g/dL (ref 30.0–36.0)
MCV: 83.8 fL (ref 80.0–100.0)
Monocytes Absolute: 1.1 10*3/uL — ABNORMAL HIGH (ref 0.1–1.0)
Monocytes Relative: 27 %
Neutro Abs: 2.3 10*3/uL (ref 1.7–7.7)
Neutrophils Relative %: 57 %
Platelet Count: 95 10*3/uL — ABNORMAL LOW (ref 150–400)
RBC: 3.34 MIL/uL — ABNORMAL LOW (ref 3.87–5.11)
RDW: 23.9 % — ABNORMAL HIGH (ref 11.5–15.5)
WBC Count: 4 10*3/uL (ref 4.0–10.5)
nRBC: 0 % (ref 0.0–0.2)

## 2022-07-18 LAB — BASIC METABOLIC PANEL - CANCER CENTER ONLY
Anion gap: 5 (ref 5–15)
BUN: 14 mg/dL (ref 8–23)
CO2: 27 mmol/L (ref 22–32)
Calcium: 8.5 mg/dL — ABNORMAL LOW (ref 8.9–10.3)
Chloride: 109 mmol/L (ref 98–111)
Creatinine: 0.58 mg/dL (ref 0.44–1.00)
GFR, Estimated: >60 mL/min (ref >60)
Glucose, Bld: 179 mg/dL — ABNORMAL HIGH (ref 70–99)
Potassium: 3.9 mmol/L (ref 3.5–5.1)
Sodium: 141 mmol/L (ref 135–145)

## 2022-07-18 LAB — VITAMIN B12: Vitamin B-12: 2021 pg/mL — ABNORMAL HIGH (ref 180–914)

## 2022-07-18 LAB — SAMPLE TO BLOOD BANK

## 2022-07-18 MED ORDER — SODIUM CHLORIDE 0.9% FLUSH
10.0000 mL | INTRAVENOUS | Status: DC | PRN
  Administered 2022-07-18: 10 mL

## 2022-07-18 MED ORDER — SODIUM CHLORIDE 0.9% FLUSH
10.0000 mL | Freq: Once | INTRAVENOUS | Status: AC
  Administered 2022-07-18: 10 mL

## 2022-07-18 MED ORDER — SODIUM CHLORIDE 0.9 % IV SOLN
75.0000 mg/m2 | Freq: Once | INTRAVENOUS | Status: AC
  Administered 2022-07-18: 130 mg via INTRAVENOUS
  Filled 2022-07-18: qty 13

## 2022-07-18 MED ORDER — CYANOCOBALAMIN 1000 MCG/ML IJ SOLN
1000.0000 ug | Freq: Once | INTRAMUSCULAR | Status: AC
  Administered 2022-07-18: 1000 ug via INTRAMUSCULAR
  Filled 2022-07-18: qty 1

## 2022-07-18 MED ORDER — SODIUM CHLORIDE 0.9 % IV SOLN: Freq: Once | INTRAVENOUS | Status: AC

## 2022-07-18 MED ORDER — HEPARIN SOD (PORK) LOCK FLUSH 100 UNIT/ML IV SOLN
500.0000 [IU] | Freq: Once | INTRAVENOUS | Status: AC | PRN
  Administered 2022-07-18: 500 [IU]

## 2022-07-18 MED ORDER — SODIUM CHLORIDE 0.9 % IV SOLN
10.0000 mg | Freq: Once | INTRAVENOUS | Status: AC
  Administered 2022-07-18: 10 mg via INTRAVENOUS
  Filled 2022-07-18: qty 10

## 2022-07-18 MED FILL — Dexamethasone Sodium Phosphate Inj 100 MG/10ML: INTRAMUSCULAR | Qty: 1 | Status: AC

## 2022-07-18 NOTE — Patient Instructions (Signed)
[facility]  Discharge Instructions: Thank you for choosing [facility] to provide your oncology and hematology care.   If you have a lab appointment with the [facility], please go directly to the [facility] and check in at the registration area.   Wear comfortable clothing and clothing appropriate for easy access to any Portacath or PICC line.   We strive to give you quality time with your provider. You may need to reschedule your appointment if you arrive late (15 or more minutes).  Arriving late affects you and other patients whose appointments are after yours.  Also, if you miss three or more appointments without notifying the office, you may be dismissed from the clinic at the provider's discretion.      For prescription refill requests, have your pharmacy contact our office and allow 72 hours for refills to be completed.    Today you received the following chemotherapy and/or immunotherapy agents: Vidaza & B-12   To help prevent nausea and vomiting after your treatment, we encourage you to take your nausea medication as directed.  BELOW ARE SYMPTOMS THAT SHOULD BE REPORTED IMMEDIATELY: *FEVER GREATER THAN 100.4 F (38 C) OR HIGHER *CHILLS OR SWEATING *NAUSEA AND VOMITING THAT IS NOT CONTROLLED WITH YOUR NAUSEA MEDICATION *UNUSUAL SHORTNESS OF BREATH *UNUSUAL BRUISING OR BLEEDING *URINARY PROBLEMS (pain or burning when urinating, or frequent urination) *BOWEL PROBLEMS (unusual diarrhea, constipation, pain near the anus) TENDERNESS IN MOUTH AND THROAT WITH OR WITHOUT PRESENCE OF ULCERS (sore throat, sores in mouth, or a toothache) UNUSUAL RASH, SWELLING OR PAIN  UNUSUAL VAGINAL DISCHARGE OR ITCHING   Items with * indicate a potential emergency and should be followed up as soon as possible or go to the Emergency Department if any problems should occur.  Please show the CHEMOTHERAPY ALERT CARD or IMMUNOTHERAPY ALERT CARD at  check-in to the Emergency Department and triage nurse.  Should you have questions after your visit or need to cancel or reschedule your appointment, please contact [facility]  Dept: [phone]  and follow the prompts.  Office hours are 8:00 a.m. to 4:30 p.m. Monday - Friday. Please note that voicemails left after 4:00 p.m. may not be returned until the following business day.  We are closed weekends and major holidays. You have access to a nurse at all times for urgent questions. Please call the main number to the clinic Dept: [phone] and follow the prompts.   For any non-urgent questions, you may also contact your provider using MyChart. We now offer e-Visits for anyone 32 and older to request care online for non-urgent symptoms. For details visit [email or web address].   Also download the MyChart app! Go to the app store, search "MyChart", open the app, select [facility], and log in with your MyChart username and password.

## 2022-07-18 NOTE — Progress Notes (Signed)
[place]   Telephone:([phone] Fax:([phone]   Clinic Follow up Note   Patient Care Team: [name], DO as PCP - General (Family Medicine) Loletha Carrow Kirke Corin, MD as Consulting Physician (Gastroenterology) Truitt Merle, MD as Consulting Physician (Hematology) Alda Berthold, DO as Consulting Physician (Neurology)  Date of Service:  07/18/2022  CHIEF COMPLAINT: f/u of  [name], severe thrombocytopenia     CURRENT THERAPY:  Azacitidine days 1-5 q28 days, started 12/28/20 Nplate 81mg/kg weekly Platelet transfusion as needed (plt<15K), blood transfusion if Hg<8.0    ASSESSMENT:  Kathleen MANGELSis a 72y.o. female with   [name] (chronic myelomonocytic leukemia) ([facility] -diagnosed in 10/2020, presented with severe thrombocytopenia  --She began azacitidine daily days 1-5 q. 28 days on 12/28/20 -She had worsening thrombocytopenia after stopping Nplate, restarted  -Bone marrow biopsy 03/18/21 showed stable disease, no increase in blasts. -she is tolerating treatment well, blood counts are stable      PLAN: -lab reviewed, no need for blood transfusion  -proceed with C8 VIDAZA labs are adequate for treatment -Lab and Nplate injection weekly -Follow-up in 4 weeks before next cycle of Vidaza -I will add on TSH to her lab her fatigue, and she will talk to her primary care physician to come off antidepressant medicine, which could be the cause of her fatigue lately.   SUMMARY OF ONCOLOGIC HISTORY: Oncology History  [name] (chronic myelomonocytic leukemia) ([facility]  10/29/2020 Imaging   CT CAP  IMPRESSION: 1. Splenomegaly with pathologically enlarged lymph nodes above and below the diaphragm, with overall stable to minimally increased abdominal adenopathy and interval progression of the pelvic adenopathy. 2. Small volume abdominopelvic ascites with diffuse mesenteric stranding. 3. Scattered bilateral pulmonary micro nodules measuring 1-2 mm. 4. Distended gallbladder  with some layering hyperdense material representing layering sludge and tiny stones seen on prior ultrasound. 5. Aortic atherosclerosis.   10/30/2020 Pathology Results   DIAGNOSIS:   BONE MARROW, ASPIRATE, CLOT, CORE:  -  Hypercellular bone marrow with panhyperplasia, atypia, and no  increase in blasts  -  See comment   PERIPHERAL BLOOD:  -  Marked thrombocytopenia  -  Absolute monocytosis  -  Normocytic anemia  -  See CBC data and comment   COMMENT:  The bone marrow is hypercellular for age (approximately 80%) with myeloid hyperplasia with maturational left shift, erythroid hyperplasia, and increased megakaryocytes.  Mild multilineage atypia is present. Blasts are not increased on aspirate smears (1% by manual differential count) or by CD34 immunohistochemistry on the core biopsy.  Concurrent flow cytometric analysis of the bone marrow aspirate demonstrates increased monocytes, and no increase in blasts or abnormal lymphoid population (see [id number].  Monocytes are also increased in peripheral  blood, persistent per electronic medical record.  In aggregate, the  findings raise the possibility of a myeloid neoplasm with the  differential diagnosis including a low-grade myelodysplastic syndrome and chronic myelomonocytic leukemia (dysplastic type).    ADDENDUM:  A reticulin special stain performed on the bone marrow core biopsy reveals no significant increase in reticulin fibrosis.  ADDENDUM:  CYTOGENETIC RESULTS:  Karyotype: 46,XX[20]  Interpretation: NORMAL FEMALE KARYOTYPE   FISH RESULTS:  Results: NORMAL   ADDENDUM:  CD123 immunohistochemistry performed on the core biopsy highlights scattered aggregates of positively staining cells consistent with plasmacytoid dendritic cells.    10/30/2020 Pathology Results   DIAGNOSIS:   BONE MARROW; FLOW CYTOMETRIC ANALYSIS:  -  Increased monocytes  -  Scant B-cells present  -  No immunophenotypically aberrant T-cell population  identified  -  No increase in blasts  -  See comment   COMMENT:  Monocytes are relatively increased (12% of all cells), without aberrant expression of CD56.  B-cells comprise <1% of total lymphocytes. CD34-positive blasts are not increased (<1% of all cells).  Correlation with concurrent morphology is recommended for complete diagnostic interpretation and overall blast enumeration (see [id number]).    10/30/2020 Pathology Results   FINAL MICROSCOPIC DIAGNOSIS:   A. LYMPH NODE, RIGHT AXILLARY, NEEDLE CORE BIOPSY:  -Lymphoid tissue present  -See comment   COMMENT:  The sections show several small needle core biopsy fragments of lymphoid tissue displaying degenerative cellular changes/necrosis and hence cannot be accurately evaluated.  Sample for flow cytometric analysis not available.  Immunohistochemical stain for CD3 and CD20 were performed with appropriate controls.  There is a mixture of T and B cells in their apparently respective compartments.  There is no definite metastatic malignancy.    11/11/2020 Pathology Results   DIAGNOSIS:   LEFT AXILLARY LYMPH NODE EXCISIONAL BIOPSY; FLOW CYTOMETRIC ANALYSIS:  -  No monotypic B-cell or immunophenotypically aberrant T-cell  population identified  -  See comment   COMMENT:  Flow cytometric analysis identifies B-cells with a normal kappa:lambda ratio of 1.7:1.  A subset of the polytypic B-cells expresses CD10. T-cells show a CD4:CD8 ratio of 3.3:1 without immunophenotypic aberrancy with the markers evaluated.  Although these results do not support the diagnosis of a clonal lymphoid population, sampling issues must always be considered when negative results are obtained, as focal lesions may not be represented in the specimen submitted.  In addition, flow cytometric immunophenotyping will not exclude other pathology if present (e.g. Hodgkin lymphoma, some T-cell lymphomas, metastatic and infectious diseases).   11/11/2020 Pathology Results   FINAL  MICROSCOPIC DIAGNOSIS:   A. LYMPH NODE, LEFT AXILLARY, DISSECTION:  -  Follicular hyperplasia with interfollicular expansion  -  See comment   COMMENT:  Sections of the lymph nodes reveal generally preserved lymph node architecture.  There is follicular hyperplasia with some follicles showing increased hyalinization of germinal centers and mild concentric encircling of germinal centers with small lymphocytes (onion skinning). Interfollicular areas are expanded with increased vascular proliferation, small lymphocytes, plasma cells, histiocytic cells, and patchy neutrophils.  The lymph node capsule is variably thickened by fibrosis with few plasma cells.   A panel of immunohistochemical stains is performed for further  characterization.  CD3 and CD20/PAX5 highlight T-cell and B-cell  compartments, respectively.  Germinal centers are highlighted by CD10 and BCL6 with appropriate absent expression of BCL2.  CD5 is similar to CD3.  CD43 also stains the T-cells.  CD4-positive T-cells exceed CD8-positive T-cells.  CD30 highlights scattered immunoblasts.  CD21 reveals intact follicular dendritic cell meshworks.  CD68 highlights increased histiocytic cells.  HHV 8 is negative.  T. pallidum reveals no definitive organisms.  Pancytokeratin is negative.  TdT stains rare cells.  CD138 highlights plasma cells which are not increased in number and show polytypic light chain expression by kappa/lambda in situ  hybridization.  EBV is negative by in situ hybridization.   Concurrent flow cytometric analysis is negative for a monoclonal B-cell or immunophenotypically aberrant T-cell population (see [id number]).   Together, the findings above are non-specific and can be seen in  reactive and infectious processes as well as [place] disease.  There is no definitive morphologic or flow cytometric evidence of involvement by a lymphoproliferative disorder from the current workup.   12/17/2020 Initial Diagnosis  [name]  (chronic myelomonocytic leukemia) ([place])   12/28/2020 - 02/03/2022 Chemotherapy   Patient is on Treatment Plan : MYELODYSPLASIA  Azacitidine IV D1-5 q28d     03/18/2021 Pathology Results   Final Diagnosis    BONE MARROW:             Persistent chronic myelomonocytic leukemia in a hypercellular bone marrow with increased monocytes and no increase in blasts.   Comment    Flow cytometric analysis showed no increase in blasts and 17% monocytes.A next generation myeloid panel showed the presence of the following mutations: NRAS, SH2B3, PHF6 and TET2. CD34 and CD117 immunstains do not show an increase in blasts. The findings are consistent with persistent [name] with a decrease in the number of blasts compared to that seen on the previous bone marrow.    Final Interpretation      BONE MARROW; FLOW CYTOMETRIC ANALYSIS:   No increased blasts identified. (see comment)  Monocytosis (17% of total events).    COMMENT: Flow cytometry identified about 0.6% of total events as immature cells. These cells express CD45 (dim), CD34, CD117, HLA-DR, CD38, CD33 (variable). This immunophenotype is consistent with normal myeloblasts. In addition, monocytes are increased and comprise of approximately 17% of total events. These monocytes show normal expression of CD33/CD64/CD14/CD11b with variable expression of CD4. Correlation with concurrent morphology and clinical data is recommended (see [id number]).    FLOW CYTOMETRY ANALYSIS: CD45 versus side scatter analysis demonstrate a predominance of granulocytes (~70%), monocytes (~17%) and lymphocytes (~6%). No significant increase in blasts identified. All tested markers were used for adequate analysis of the cells and were appropriately reviewed.     12/28/2021 -  Chemotherapy   Patient is on Treatment Plan : MYELODYSPLASIA  Azacitidine IV D1-5 q28d        INTERVAL HISTORY:  Kathleen Gibson is here for a follow up of   [name], severe thrombocytopenia   She was last seen  by me on 06/20/2022 She presents to the clinic alone. Pt reports that she is extra tired from the last treatment. Pt is able to take care of her house hold and herself. Pt denies having fever and chills. Her appetite is good.   All other systems were reviewed with the patient and are negative.  MEDICAL HISTORY:  Past Medical History:  Diagnosis Date   Diabetes ([place])    HLD (hyperlipidemia)    Hypertension    Pernicious anemia    Pneumonia 09/05/2019   Renal insufficiency 09/06/2019   Vitamin D deficiency     SURGICAL HISTORY: Past Surgical History:  Procedure Laterality Date   ABDOMINAL HERNIA REPAIR  2009   ABDOMINOPLASTY     AXILLARY LYMPH NODE BIOPSY Left 11/11/2020   Procedure: LEFT AXILLARY EXCISIONAL LYMPH NODE BIOPSY;  Surgeon: [name], MD;  Location: [facility];  Service: General;  Laterality: Left;   [place]   GASTRIC BYPASS  2000   hemorroid surgery  2007   IR IMAGING GUIDED PORT INSERTION  07/26/2021   IR KYPHO EA ADDL LEVEL THORACIC OR LUMBAR  05/31/2021   IR KYPHO THORACIC WITH BONE BIOPSY  05/31/2021   TONSILLECTOMY AND ADENOIDECTOMY  1957    I have reviewed the social history and family history with the patient and they are unchanged from previous note.  ALLERGIES:  is allergic to asa [aspirin], nsaids, and red blood cells.  MEDICATIONS:  Current Outpatient Medications  Medication Sig Dispense Refill   cyanocobalamin (,VITAMIN B-12,) 1000 MCG/ML injection  Inject 1,000 mcg into the muscle every 30 (thirty) days.     cyclobenzaprine (FLEXERIL) 5 MG tablet Take 1 tablet (5 mg total) by mouth 3 (three) times daily as needed for muscle spasms. 30 tablet 0   furosemide (LASIX) 20 MG tablet TAKE 1 TABLET BY MOUTH EVERY DAY FOR UP TO 7 DAYS THEN AS NEEDED FOR LEG EDEMA 90 tablet 1   glucose blood (ONETOUCH VERIO) test strip Monitor blood sugars twice daily 100 each 5   KLOR-CON M10 10 MEQ tablet TAKE 1 TABLET BY MOUTH TWICE A DAY TAKE ALONG WITH FUROSEMIDE  ONLY 180 tablet 1   Lancets (ONETOUCH ULTRASOFT) lancets Monitor blood sugars twice daily 100 each 5   lidocaine-prilocaine (EMLA) cream APPLY 2 GRAMS TO PORT-A-CATH SITE 30-60 MINUTES PRIOR TO PORT ACCESS AS NEEDED 30 g 1   metoprolol succinate (TOPROL-XL) 25 MG 24 hr tablet Take 1 tablet (25 mg total) by mouth daily. 90 tablet 1   naloxone (NARCAN) nasal spray 4 mg/0.1 mL Place 1 spray into the nose once as needed (overdose). 2 each 0   ondansetron (ZOFRAN-ODT) 4 MG disintegrating tablet Take 1 tablet (4 mg total) by mouth every 8 (eight) hours as needed for nausea or vomiting. 20 tablet 0   Oxycodone HCl 10 MG TABS Take 1 tablet (10 mg total) by mouth 2 (two) times daily as needed. 60 tablet 0   polyethylene glycol powder (GLYCOLAX/MIRALAX) 17 GM/SCOOP powder Dissolve 1 capful (17 g) in water and drink 2 (two) times daily. (Patient taking differently: Take 17 g by mouth daily.) 510 g 2   pregabalin (LYRICA) 150 MG capsule Take 1 capsule (150 mg total) by mouth 2 (two) times daily. 180 capsule 1   promethazine-dextromethorphan (PROMETHAZINE-DM) 6.25-15 MG/5ML syrup Take 5 mLs by mouth 4 (four) times daily as needed. 118 mL 0   senna-docusate (SENOKOT-S) 8.6-50 MG tablet Take 2 tablets by mouth at bedtime as needed for mild constipation. 60 tablet 1   sitaGLIPtin-metformin (JANUMET) 50-1000 MG tablet Take 1 tablet by mouth at bedtime. 90 tablet 1   No current facility-administered medications for this visit.   Facility-Administered Medications Ordered in Other Visits  Medication Dose Route Frequency Provider Last Rate Last Admin   0.9 %  sodium chloride infusion (Manually program via Guardrails IV Fluids)  250 mL Intravenous Once Truitt Merle, MD       sodium chloride flush (NS) 0.9 % injection 10 mL  10 mL Intracatheter PRN Truitt Merle, MD   10 mL at 07/18/22 1158    PHYSICAL EXAMINATION: ECOG PERFORMANCE STATUS: 2 - Symptomatic, <50% confined to bed  Vitals:   07/18/22 0953  BP: 113/63   Pulse: 83  Resp: 18  Temp: 98.6 F (37 C)  SpO2: 98%   Wt Readings from Last 3 Encounters:  07/18/22 143 lb (64.9 kg)  06/20/22 146 lb 6.4 oz (66.4 kg)  04/25/22 151 lb 1.6 oz (68.5 kg)     GENERAL:alert, no distress and comfortable SKIN: skin color normal, no rashes or significant lesions EYES: normal, Conjunctiva are pink and non-injected, sclera clear  NEURO: alert & oriented x 3 with fluent speech   LABORATORY DATA:  I have reviewed the data as listed    Latest Ref Rng & Units 07/18/2022    9:27 AM 07/13/2022   10:01 AM 07/06/2022    9:28 AM  CBC  WBC 4.0 - 10.5 K/uL 4.0  5.4  8.2   Hemoglobin 12.0 - 15.0 g/dL 9.0  10.4  10.0   Hematocrit 36.0 - 46.0 % 28.0  31.9  31.0   Platelets 150 - 400 K/uL 95  70  65         Latest Ref Rng & Units 07/18/2022    9:27 AM 07/13/2022   10:01 AM 06/29/2022    9:48 AM  CMP  Glucose 70 - 99 mg/dL 179  280  168   BUN 8 - 23 mg/dL '14  17  13   '$ Creatinine 0.44 - 1.00 mg/dL 0.58  0.64  0.60   Sodium 135 - 145 mmol/L 141  140  141   Potassium 3.5 - 5.1 mmol/L 3.9  4.4  4.0   Chloride 98 - 111 mmol/L 109  108  111   CO2 22 - 32 mmol/L '27  25  27   '$ Calcium 8.9 - 10.3 mg/dL 8.5  8.8  8.5   Total Protein 6.5 - 8.1 g/dL  7.2  6.1   Total Bilirubin 0.3 - 1.2 mg/dL  1.4  1.3   Alkaline Phos 38 - 126 U/L  124  98   AST 15 - 41 U/L  23  20   ALT 0 - 44 U/L  23  23       RADIOGRAPHIC STUDIES: I have personally reviewed the radiological images as listed and agreed with the findings in the report. No results found.    Orders Placed This Encounter  Procedures   TSH    Add on to lab draw today    Standing Status:   Future    Standing Expiration Date:   07/19/2023   CBC with Differential ([facility] Only)    Standing Status:   Future    Standing Expiration Date:   4/0/9811   Basic Metabolic Panel - [facility] Only    Standing Status:   Future    Standing Expiration Date:   08/16/2023   CBC with Differential ([facility] Only)     Standing Status:   Future    Standing Expiration Date:   02/11/4781   Basic Metabolic Panel - [facility] Only    Standing Status:   Future    Standing Expiration Date:   09/13/2023   All questions were answered. The patient knows to call the clinic with any problems, questions or concerns. No barriers to learning was detected. The total time spent in the appointment was 30 minutes.     Truitt Merle, MD 07/18/2022   Felicity Coyer, CMA, am acting as scribe for Truitt Merle, MD.   I have reviewed the above documentation for accuracy and completeness, and I agree with the above.

## 2022-07-19 ENCOUNTER — Inpatient Hospital Stay: Payer: Medicare HMO

## 2022-07-19 ENCOUNTER — Telehealth: Payer: Self-pay | Admitting: Hematology

## 2022-07-19 VITALS — BP 102/47 | HR 82 | Temp 98.4°F | Resp 16

## 2022-07-19 DIAGNOSIS — E119 Type 2 diabetes mellitus without complications: Secondary | ICD-10-CM | POA: Diagnosis not present

## 2022-07-19 DIAGNOSIS — Z79899 Other long term (current) drug therapy: Secondary | ICD-10-CM | POA: Diagnosis not present

## 2022-07-19 DIAGNOSIS — Z5111 Encounter for antineoplastic chemotherapy: Secondary | ICD-10-CM | POA: Diagnosis not present

## 2022-07-19 DIAGNOSIS — C9310 Chronic myelomonocytic leukemia not having achieved remission: Secondary | ICD-10-CM | POA: Diagnosis not present

## 2022-07-19 DIAGNOSIS — I10 Essential (primary) hypertension: Secondary | ICD-10-CM | POA: Diagnosis not present

## 2022-07-19 DIAGNOSIS — Z7984 Long term (current) use of oral hypoglycemic drugs: Secondary | ICD-10-CM | POA: Diagnosis not present

## 2022-07-19 DIAGNOSIS — Z9884 Bariatric surgery status: Secondary | ICD-10-CM | POA: Diagnosis not present

## 2022-07-19 DIAGNOSIS — D693 Immune thrombocytopenic purpura: Secondary | ICD-10-CM | POA: Diagnosis not present

## 2022-07-19 MED ORDER — SODIUM CHLORIDE 0.9 % IV SOLN
75.0000 mg/m2 | Freq: Once | INTRAVENOUS | Status: AC
  Administered 2022-07-19: 130 mg via INTRAVENOUS
  Filled 2022-07-19: qty 13

## 2022-07-19 MED ORDER — HEPARIN SOD (PORK) LOCK FLUSH 100 UNIT/ML IV SOLN
500.0000 [IU] | Freq: Once | INTRAVENOUS | Status: AC | PRN
  Administered 2022-07-19: 500 [IU]

## 2022-07-19 MED ORDER — SODIUM CHLORIDE 0.9% FLUSH
10.0000 mL | INTRAVENOUS | Status: DC | PRN
  Administered 2022-07-19: 10 mL

## 2022-07-19 MED ORDER — SODIUM CHLORIDE 0.9 % IV SOLN: Freq: Once | INTRAVENOUS | Status: AC

## 2022-07-19 MED ORDER — SODIUM CHLORIDE 0.9 % IV SOLN
10.0000 mg | Freq: Once | INTRAVENOUS | Status: AC
  Administered 2022-07-19: 10 mg via INTRAVENOUS
  Filled 2022-07-19: qty 10

## 2022-07-19 MED FILL — Dexamethasone Sodium Phosphate Inj 100 MG/10ML: INTRAMUSCULAR | Qty: 1 | Status: AC

## 2022-07-19 NOTE — Patient Instructions (Signed)
[facility]  Discharge Instructions: Thank you for choosing [facility] to provide your oncology and hematology care.   If you have a lab appointment with the Cancer Center, please go directly to the Cancer Center and check in at the registration area.   Wear comfortable clothing and clothing appropriate for easy access to any Portacath or PICC line.   We strive to give you quality time with your provider. You may need to reschedule your appointment if you arrive late (15 or more minutes).  Arriving late affects you and other patients whose appointments are after yours.  Also, if you miss three or more appointments without notifying the office, you may be dismissed from the clinic at the provider's discretion.      For prescription refill requests, have your pharmacy contact our office and allow 72 hours for refills to be completed.    Today you received the following chemotherapy and/or immunotherapy agents Vidaza      To help prevent nausea and vomiting after your treatment, we encourage you to take your nausea medication as directed.  BELOW ARE SYMPTOMS THAT SHOULD BE REPORTED IMMEDIATELY: *FEVER GREATER THAN 100.4 F (38 C) OR HIGHER *CHILLS OR SWEATING *NAUSEA AND VOMITING THAT IS NOT CONTROLLED WITH YOUR NAUSEA MEDICATION *UNUSUAL SHORTNESS OF BREATH *UNUSUAL BRUISING OR BLEEDING *URINARY PROBLEMS (pain or burning when urinating, or frequent urination) *BOWEL PROBLEMS (unusual diarrhea, constipation, pain near the anus) TENDERNESS IN MOUTH AND THROAT WITH OR WITHOUT PRESENCE OF ULCERS (sore throat, sores in mouth, or a toothache) UNUSUAL RASH, SWELLING OR PAIN  UNUSUAL VAGINAL DISCHARGE OR ITCHING   Items with * indicate a potential emergency and should be followed up as soon as possible or go to the Emergency Department if any problems should occur.  Please show the CHEMOTHERAPY ALERT CARD or IMMUNOTHERAPY ALERT CARD at check-in  to the Emergency Department and triage nurse.  Should you have questions after your visit or need to cancel or reschedule your appointment, please contact [facility]  Dept: [phone]  and follow the prompts.  Office hours are 8:00 a.m. to 4:30 p.m. Monday - Friday. Please note that voicemails left after 4:00 p.m. may not be returned until the following business day.  We are closed weekends and major holidays. You have access to a nurse at all times for urgent questions. Please call the main number to the clinic Dept: [phone] and follow the prompts.   For any non-urgent questions, you may also contact your provider using MyChart. We now offer e-Visits for anyone 18 and older to request care online for non-urgent symptoms. For details visit [email or web address].   Also download the MyChart app! Go to the app store, search "MyChart", open the app, select , and log in with your MyChart username and password.   

## 2022-07-19 NOTE — Telephone Encounter (Signed)
Contacted patient to scheduled appointments. Left message with appointment details and a call back number if patient had any questions or could not accommodate the time we provided.   

## 2022-07-20 ENCOUNTER — Inpatient Hospital Stay: Payer: Medicare HMO

## 2022-07-20 VITALS — BP 108/60 | HR 74 | Temp 98.2°F

## 2022-07-20 DIAGNOSIS — C9310 Chronic myelomonocytic leukemia not having achieved remission: Secondary | ICD-10-CM | POA: Diagnosis not present

## 2022-07-20 DIAGNOSIS — Z5111 Encounter for antineoplastic chemotherapy: Secondary | ICD-10-CM | POA: Diagnosis not present

## 2022-07-20 DIAGNOSIS — D696 Thrombocytopenia, unspecified: Secondary | ICD-10-CM

## 2022-07-20 DIAGNOSIS — Z95828 Presence of other vascular implants and grafts: Secondary | ICD-10-CM

## 2022-07-20 DIAGNOSIS — Z79899 Other long term (current) drug therapy: Secondary | ICD-10-CM | POA: Diagnosis not present

## 2022-07-20 DIAGNOSIS — Z9884 Bariatric surgery status: Secondary | ICD-10-CM | POA: Diagnosis not present

## 2022-07-20 DIAGNOSIS — I10 Essential (primary) hypertension: Secondary | ICD-10-CM | POA: Diagnosis not present

## 2022-07-20 DIAGNOSIS — Z7984 Long term (current) use of oral hypoglycemic drugs: Secondary | ICD-10-CM | POA: Diagnosis not present

## 2022-07-20 DIAGNOSIS — E119 Type 2 diabetes mellitus without complications: Secondary | ICD-10-CM | POA: Diagnosis not present

## 2022-07-20 DIAGNOSIS — D693 Immune thrombocytopenic purpura: Secondary | ICD-10-CM | POA: Diagnosis not present

## 2022-07-20 MED ORDER — HEPARIN SOD (PORK) LOCK FLUSH 100 UNIT/ML IV SOLN
500.0000 [IU] | Freq: Once | INTRAVENOUS | Status: AC | PRN
  Administered 2022-07-20: 500 [IU]

## 2022-07-20 MED ORDER — SODIUM CHLORIDE 0.9% FLUSH
10.0000 mL | INTRAVENOUS | Status: DC | PRN
  Administered 2022-07-20: 10 mL

## 2022-07-20 MED ORDER — ROMIPLOSTIM INJECTION 500 MCG
8.5000 ug/kg | Freq: Once | SUBCUTANEOUS | Status: AC
  Administered 2022-07-20: 550 ug via SUBCUTANEOUS
  Filled 2022-07-20: qty 1

## 2022-07-20 MED ORDER — SODIUM CHLORIDE 0.9 % IV SOLN: Freq: Once | INTRAVENOUS | Status: AC

## 2022-07-20 MED ORDER — SODIUM CHLORIDE 0.9 % IV SOLN
75.0000 mg/m2 | Freq: Once | INTRAVENOUS | Status: AC
  Administered 2022-07-20: 130 mg via INTRAVENOUS
  Filled 2022-07-20: qty 13

## 2022-07-20 MED ORDER — SODIUM CHLORIDE 0.9 % IV SOLN
10.0000 mg | Freq: Once | INTRAVENOUS | Status: AC
  Administered 2022-07-20: 10 mg via INTRAVENOUS
  Filled 2022-07-20: qty 10

## 2022-07-20 MED FILL — Dexamethasone Sodium Phosphate Inj 100 MG/10ML: INTRAMUSCULAR | Qty: 1 | Status: AC

## 2022-07-20 NOTE — Patient Instructions (Signed)
[facility]  Discharge Instructions: Thank you for choosing [facility] to provide your oncology and hematology care.   If you have a lab appointment with the [facility], please go directly to the [facility] and check in at the registration area.   Wear comfortable clothing and clothing appropriate for easy access to any Portacath or PICC line.   We strive to give you quality time with your provider. You may need to reschedule your appointment if you arrive late (15 or more minutes).  Arriving late affects you and other patients whose appointments are after yours.  Also, if you miss three or more appointments without notifying the office, you may be dismissed from the clinic at the provider's discretion.      For prescription refill requests, have your pharmacy contact our office and allow 72 hours for refills to be completed.    Today you received the following chemotherapy and/or immunotherapy agents :  Azacitabine & N-Plate      To help prevent nausea and vomiting after your treatment, we encourage you to take your nausea medication as directed.  BELOW ARE SYMPTOMS THAT SHOULD BE REPORTED IMMEDIATELY: *FEVER GREATER THAN 100.4 F (38 C) OR HIGHER *CHILLS OR SWEATING *NAUSEA AND VOMITING THAT IS NOT CONTROLLED WITH YOUR NAUSEA MEDICATION *UNUSUAL SHORTNESS OF BREATH *UNUSUAL BRUISING OR BLEEDING *URINARY PROBLEMS (pain or burning when urinating, or frequent urination) *BOWEL PROBLEMS (unusual diarrhea, constipation, pain near the anus) TENDERNESS IN MOUTH AND THROAT WITH OR WITHOUT PRESENCE OF ULCERS (sore throat, sores in mouth, or a toothache) UNUSUAL RASH, SWELLING OR PAIN  UNUSUAL VAGINAL DISCHARGE OR ITCHING   Items with * indicate a potential emergency and should be followed up as soon as possible or go to the Emergency Department if any problems should occur.  Please show the CHEMOTHERAPY ALERT CARD or IMMUNOTHERAPY ALERT  CARD at check-in to the Emergency Department and triage nurse.  Should you have questions after your visit or need to cancel or reschedule your appointment, please contact [facility]  Dept: [phone]  and follow the prompts.  Office hours are 8:00 a.m. to 4:30 p.m. Monday - Friday. Please note that voicemails left after 4:00 p.m. may not be returned until the following business day.  We are closed weekends and major holidays. You have access to a nurse at all times for urgent questions. Please call the main number to the clinic Dept: [phone] and follow the prompts.   For any non-urgent questions, you may also contact your provider using MyChart. We now offer e-Visits for anyone 63 and older to request care online for non-urgent symptoms. For details visit [email or web address].   Also download the MyChart app! Go to the app store, search "MyChart", open the app, select [facility], and log in with your MyChart username and password.

## 2022-07-21 ENCOUNTER — Inpatient Hospital Stay: Payer: Medicare HMO

## 2022-07-21 VITALS — BP 116/65 | HR 77 | Temp 98.4°F | Resp 16

## 2022-07-21 DIAGNOSIS — I10 Essential (primary) hypertension: Secondary | ICD-10-CM | POA: Diagnosis not present

## 2022-07-21 DIAGNOSIS — C9310 Chronic myelomonocytic leukemia not having achieved remission: Secondary | ICD-10-CM | POA: Diagnosis not present

## 2022-07-21 DIAGNOSIS — Z5111 Encounter for antineoplastic chemotherapy: Secondary | ICD-10-CM | POA: Diagnosis not present

## 2022-07-21 DIAGNOSIS — E119 Type 2 diabetes mellitus without complications: Secondary | ICD-10-CM | POA: Diagnosis not present

## 2022-07-21 DIAGNOSIS — Z7984 Long term (current) use of oral hypoglycemic drugs: Secondary | ICD-10-CM | POA: Diagnosis not present

## 2022-07-21 DIAGNOSIS — Z79899 Other long term (current) drug therapy: Secondary | ICD-10-CM | POA: Diagnosis not present

## 2022-07-21 DIAGNOSIS — D693 Immune thrombocytopenic purpura: Secondary | ICD-10-CM | POA: Diagnosis not present

## 2022-07-21 DIAGNOSIS — Z9884 Bariatric surgery status: Secondary | ICD-10-CM | POA: Diagnosis not present

## 2022-07-21 MED ORDER — SODIUM CHLORIDE 0.9 % IV SOLN
10.0000 mg | Freq: Once | INTRAVENOUS | Status: AC
  Administered 2022-07-21: 10 mg via INTRAVENOUS
  Filled 2022-07-21: qty 10

## 2022-07-21 MED ORDER — HEPARIN SOD (PORK) LOCK FLUSH 100 UNIT/ML IV SOLN
500.0000 [IU] | Freq: Once | INTRAVENOUS | Status: AC | PRN
  Administered 2022-07-21: 500 [IU]

## 2022-07-21 MED ORDER — SODIUM CHLORIDE 0.9 % IV SOLN: Freq: Once | INTRAVENOUS | Status: AC

## 2022-07-21 MED ORDER — SODIUM CHLORIDE 0.9 % IV SOLN
75.0000 mg/m2 | Freq: Once | INTRAVENOUS | Status: AC
  Administered 2022-07-21: 130 mg via INTRAVENOUS
  Filled 2022-07-21: qty 13

## 2022-07-21 MED ORDER — SODIUM CHLORIDE 0.9% FLUSH
10.0000 mL | INTRAVENOUS | Status: DC | PRN
  Administered 2022-07-21: 10 mL

## 2022-07-21 MED FILL — Dexamethasone Sodium Phosphate Inj 100 MG/10ML: INTRAMUSCULAR | Qty: 1 | Status: AC

## 2022-07-21 NOTE — Patient Instructions (Signed)
[facility]  Discharge Instructions: Thank you for choosing [facility] to provide your oncology and hematology care.   If you have a lab appointment with the Cancer Center, please go directly to the Cancer Center and check in at the registration area.   Wear comfortable clothing and clothing appropriate for easy access to any Portacath or PICC line.   We strive to give you quality time with your provider. You may need to reschedule your appointment if you arrive late (15 or more minutes).  Arriving late affects you and other patients whose appointments are after yours.  Also, if you miss three or more appointments without notifying the office, you may be dismissed from the clinic at the provider's discretion.      For prescription refill requests, have your pharmacy contact our office and allow 72 hours for refills to be completed.    Today you received the following chemotherapy and/or immunotherapy agents Vidaza      To help prevent nausea and vomiting after your treatment, we encourage you to take your nausea medication as directed.  BELOW ARE SYMPTOMS THAT SHOULD BE REPORTED IMMEDIATELY: *FEVER GREATER THAN 100.4 F (38 C) OR HIGHER *CHILLS OR SWEATING *NAUSEA AND VOMITING THAT IS NOT CONTROLLED WITH YOUR NAUSEA MEDICATION *UNUSUAL SHORTNESS OF BREATH *UNUSUAL BRUISING OR BLEEDING *URINARY PROBLEMS (pain or burning when urinating, or frequent urination) *BOWEL PROBLEMS (unusual diarrhea, constipation, pain near the anus) TENDERNESS IN MOUTH AND THROAT WITH OR WITHOUT PRESENCE OF ULCERS (sore throat, sores in mouth, or a toothache) UNUSUAL RASH, SWELLING OR PAIN  UNUSUAL VAGINAL DISCHARGE OR ITCHING   Items with * indicate a potential emergency and should be followed up as soon as possible or go to the Emergency Department if any problems should occur.  Please show the CHEMOTHERAPY ALERT CARD or IMMUNOTHERAPY ALERT CARD at check-in  to the Emergency Department and triage nurse.  Should you have questions after your visit or need to cancel or reschedule your appointment, please contact [facility]  Dept: [phone]  and follow the prompts.  Office hours are 8:00 a.m. to 4:30 p.m. Monday - Friday. Please note that voicemails left after 4:00 p.m. may not be returned until the following business day.  We are closed weekends and major holidays. You have access to a nurse at all times for urgent questions. Please call the main number to the clinic Dept: [phone] and follow the prompts.   For any non-urgent questions, you may also contact your provider using MyChart. We now offer e-Visits for anyone 18 and older to request care online for non-urgent symptoms. For details visit [email or web address].   Also download the MyChart app! Go to the app store, search "MyChart", open the app, select [facility], and log in with your MyChart username and password.   

## 2022-07-22 ENCOUNTER — Inpatient Hospital Stay: Payer: Medicare HMO

## 2022-07-22 VITALS — BP 149/81 | HR 106 | Temp 98.1°F | Resp 18

## 2022-07-22 DIAGNOSIS — E119 Type 2 diabetes mellitus without complications: Secondary | ICD-10-CM | POA: Diagnosis not present

## 2022-07-22 DIAGNOSIS — C9310 Chronic myelomonocytic leukemia not having achieved remission: Secondary | ICD-10-CM | POA: Diagnosis not present

## 2022-07-22 DIAGNOSIS — Z7984 Long term (current) use of oral hypoglycemic drugs: Secondary | ICD-10-CM | POA: Diagnosis not present

## 2022-07-22 DIAGNOSIS — Z9884 Bariatric surgery status: Secondary | ICD-10-CM | POA: Diagnosis not present

## 2022-07-22 DIAGNOSIS — D693 Immune thrombocytopenic purpura: Secondary | ICD-10-CM | POA: Diagnosis not present

## 2022-07-22 DIAGNOSIS — I10 Essential (primary) hypertension: Secondary | ICD-10-CM | POA: Diagnosis not present

## 2022-07-22 DIAGNOSIS — Z79899 Other long term (current) drug therapy: Secondary | ICD-10-CM | POA: Diagnosis not present

## 2022-07-22 DIAGNOSIS — Z5111 Encounter for antineoplastic chemotherapy: Secondary | ICD-10-CM | POA: Diagnosis not present

## 2022-07-22 MED ORDER — SODIUM CHLORIDE 0.9 % IV SOLN
10.0000 mg | Freq: Once | INTRAVENOUS | Status: AC
  Administered 2022-07-22: 10 mg via INTRAVENOUS
  Filled 2022-07-22: qty 10

## 2022-07-22 MED ORDER — HEPARIN SOD (PORK) LOCK FLUSH 100 UNIT/ML IV SOLN
500.0000 [IU] | Freq: Once | INTRAVENOUS | Status: AC | PRN
  Administered 2022-07-22: 500 [IU]

## 2022-07-22 MED ORDER — SODIUM CHLORIDE 0.9% FLUSH
10.0000 mL | INTRAVENOUS | Status: DC | PRN
  Administered 2022-07-22: 10 mL

## 2022-07-22 MED ORDER — SODIUM CHLORIDE 0.9 % IV SOLN
75.0000 mg/m2 | Freq: Once | INTRAVENOUS | Status: AC
  Administered 2022-07-22: 130 mg via INTRAVENOUS
  Filled 2022-07-22: qty 13

## 2022-07-22 MED ORDER — SODIUM CHLORIDE 0.9 % IV SOLN: Freq: Once | INTRAVENOUS | Status: AC

## 2022-07-22 NOTE — Patient Instructions (Signed)
[facility]  Discharge Instructions: Thank you for choosing [facility] to provide your oncology and hematology care.   If you have a lab appointment with the Cancer Center, please go directly to the Cancer Center and check in at the registration area.   Wear comfortable clothing and clothing appropriate for easy access to any Portacath or PICC line.   We strive to give you quality time with your provider. You may need to reschedule your appointment if you arrive late (15 or more minutes).  Arriving late affects you and other patients whose appointments are after yours.  Also, if you miss three or more appointments without notifying the office, you may be dismissed from the clinic at the provider's discretion.      For prescription refill requests, have your pharmacy contact our office and allow 72 hours for refills to be completed.    Today you received the following chemotherapy and/or immunotherapy agents Vidaza      To help prevent nausea and vomiting after your treatment, we encourage you to take your nausea medication as directed.  BELOW ARE SYMPTOMS THAT SHOULD BE REPORTED IMMEDIATELY: *FEVER GREATER THAN 100.4 F (38 C) OR HIGHER *CHILLS OR SWEATING *NAUSEA AND VOMITING THAT IS NOT CONTROLLED WITH YOUR NAUSEA MEDICATION *UNUSUAL SHORTNESS OF BREATH *UNUSUAL BRUISING OR BLEEDING *URINARY PROBLEMS (pain or burning when urinating, or frequent urination) *BOWEL PROBLEMS (unusual diarrhea, constipation, pain near the anus) TENDERNESS IN MOUTH AND THROAT WITH OR WITHOUT PRESENCE OF ULCERS (sore throat, sores in mouth, or a toothache) UNUSUAL RASH, SWELLING OR PAIN  UNUSUAL VAGINAL DISCHARGE OR ITCHING   Items with * indicate a potential emergency and should be followed up as soon as possible or go to the Emergency Department if any problems should occur.  Please show the CHEMOTHERAPY ALERT CARD or IMMUNOTHERAPY ALERT CARD at check-in  to the Emergency Department and triage nurse.  Should you have questions after your visit or need to cancel or reschedule your appointment, please contact [facility]  Dept: [phone]  and follow the prompts.  Office hours are 8:00 a.m. to 4:30 p.m. Monday - Friday. Please note that voicemails left after 4:00 p.m. may not be returned until the following business day.  We are closed weekends and major holidays. You have access to a nurse at all times for urgent questions. Please call the main number to the clinic Dept: [phone] and follow the prompts.   For any non-urgent questions, you may also contact your provider using MyChart. We now offer e-Visits for anyone 18 and older to request care online for non-urgent symptoms. For details visit [email or web address].   Also download the MyChart app! Go to the app store, search "MyChart", open the app, select [facility], and log in with your MyChart username and password.   

## 2022-07-27 ENCOUNTER — Inpatient Hospital Stay: Payer: Medicare HMO

## 2022-07-27 ENCOUNTER — Other Ambulatory Visit: Payer: Self-pay

## 2022-07-27 VITALS — BP 118/66 | HR 101 | Temp 99.6°F | Resp 18

## 2022-07-27 DIAGNOSIS — E119 Type 2 diabetes mellitus without complications: Secondary | ICD-10-CM | POA: Diagnosis not present

## 2022-07-27 DIAGNOSIS — D696 Thrombocytopenia, unspecified: Secondary | ICD-10-CM

## 2022-07-27 DIAGNOSIS — Z95828 Presence of other vascular implants and grafts: Secondary | ICD-10-CM

## 2022-07-27 DIAGNOSIS — Z79899 Other long term (current) drug therapy: Secondary | ICD-10-CM | POA: Diagnosis not present

## 2022-07-27 DIAGNOSIS — Z7984 Long term (current) use of oral hypoglycemic drugs: Secondary | ICD-10-CM | POA: Diagnosis not present

## 2022-07-27 DIAGNOSIS — C9310 Chronic myelomonocytic leukemia not having achieved remission: Secondary | ICD-10-CM

## 2022-07-27 DIAGNOSIS — D693 Immune thrombocytopenic purpura: Secondary | ICD-10-CM | POA: Diagnosis not present

## 2022-07-27 DIAGNOSIS — I10 Essential (primary) hypertension: Secondary | ICD-10-CM | POA: Diagnosis not present

## 2022-07-27 DIAGNOSIS — Z5111 Encounter for antineoplastic chemotherapy: Secondary | ICD-10-CM | POA: Diagnosis not present

## 2022-07-27 DIAGNOSIS — R5383 Other fatigue: Secondary | ICD-10-CM

## 2022-07-27 DIAGNOSIS — Z9884 Bariatric surgery status: Secondary | ICD-10-CM | POA: Diagnosis not present

## 2022-07-27 LAB — CBC WITH DIFFERENTIAL (CANCER CENTER ONLY)
Abs Immature Granulocytes: 0.15 10*3/uL — ABNORMAL HIGH (ref 0.00–0.07)
Basophils Absolute: 0.1 10*3/uL (ref 0.0–0.1)
Basophils Relative: 1 %
Eosinophils Absolute: 0 10*3/uL (ref 0.0–0.5)
Eosinophils Relative: 1 %
HCT: 29.1 % — ABNORMAL LOW (ref 36.0–46.0)
Hemoglobin: 9.4 g/dL — ABNORMAL LOW (ref 12.0–15.0)
Immature Granulocytes: 2 %
Lymphocytes Relative: 11 %
Lymphs Abs: 0.7 10*3/uL (ref 0.7–4.0)
MCH: 26.7 pg (ref 26.0–34.0)
MCHC: 32.3 g/dL (ref 30.0–36.0)
MCV: 82.7 fL (ref 80.0–100.0)
Monocytes Absolute: 0.8 10*3/uL (ref 0.1–1.0)
Monocytes Relative: 11 %
Neutro Abs: 5 10*3/uL (ref 1.7–7.7)
Neutrophils Relative %: 74 %
Platelet Count: 82 10*3/uL — ABNORMAL LOW (ref 150–400)
RBC: 3.52 MIL/uL — ABNORMAL LOW (ref 3.87–5.11)
RDW: 22.5 % — ABNORMAL HIGH (ref 11.5–15.5)
WBC Count: 6.7 10*3/uL (ref 4.0–10.5)
nRBC: 0 % (ref 0.0–0.2)

## 2022-07-27 LAB — CMP (CANCER CENTER ONLY)
ALT: 31 U/L (ref 0–44)
AST: 28 U/L (ref 15–41)
Albumin: 3.2 g/dL — ABNORMAL LOW (ref 3.5–5.0)
Alkaline Phosphatase: 123 U/L (ref 38–126)
Anion gap: 5 (ref 5–15)
BUN: 12 mg/dL (ref 8–23)
CO2: 25 mmol/L (ref 22–32)
Calcium: 8 mg/dL — ABNORMAL LOW (ref 8.9–10.3)
Chloride: 107 mmol/L (ref 98–111)
Creatinine: 0.7 mg/dL (ref 0.44–1.00)
GFR, Estimated: >60 mL/min (ref >60)
Glucose, Bld: 182 mg/dL — ABNORMAL HIGH (ref 70–99)
Potassium: 3.7 mmol/L (ref 3.5–5.1)
Sodium: 137 mmol/L (ref 135–145)
Total Bilirubin: 1.4 mg/dL — ABNORMAL HIGH (ref 0.3–1.2)
Total Protein: 6.6 g/dL (ref 6.5–8.1)

## 2022-07-27 LAB — TSH: TSH: 1.833 u[IU]/mL (ref 0.350–4.500)

## 2022-07-27 LAB — SAMPLE TO BLOOD BANK

## 2022-07-27 MED ORDER — HEPARIN SOD (PORK) LOCK FLUSH 100 UNIT/ML IV SOLN
500.0000 [IU] | Freq: Once | INTRAVENOUS | Status: AC
  Administered 2022-07-27: 500 [IU]

## 2022-07-27 MED ORDER — ROMIPLOSTIM INJECTION 500 MCG
8.5000 ug/kg | Freq: Once | SUBCUTANEOUS | Status: AC
  Administered 2022-07-27: 550 ug via SUBCUTANEOUS
  Filled 2022-07-27: qty 1.1

## 2022-07-27 MED ORDER — SODIUM CHLORIDE 0.9% FLUSH
10.0000 mL | Freq: Once | INTRAVENOUS | Status: AC
  Administered 2022-07-27: 10 mL

## 2022-07-27 NOTE — Patient Instructions (Signed)
Romiplostim Injection What is this medication? ROMIPLOSTIM (roe mi PLOE stim) treats low levels of platelets in your body caused by immune thrombocytopenia (ITP). It is prescribed when other medications have not worked or cannot be tolerated. It may also be used to help people who have been exposed to high doses of radiation. It works by increasing the amount of platelets in your blood. This lowers the risk of bleeding. This medicine may be used for other purposes; ask your health care provider or pharmacist if you have questions. COMMON BRAND NAME(S): Nplate What should I tell my care team before I take this medication? They need to know if you have any of these conditions: Blood clots Myelodysplastic syndrome An unusual or allergic reaction to romiplostim, mannitol, other medications, foods, dyes, or preservatives Pregnant or trying to get pregnant Breast-feeding How should I use this medication? This medication is injected under the skin. It is given by a care team in a hospital or clinic setting. A special MedGuide will be given to you before each treatment. Be sure to read this information carefully each time. Talk to your care team about the use of this medication in children. While it may be prescribed for children as young as newborns for selected conditions, precautions do apply. Overdosage: If you think you have taken too much of this medicine contact a poison control center or emergency room at once. NOTE: This medicine is only for you. Do not share this medicine with others. What if I miss a dose? Keep appointments for follow-up doses. It is important not to miss your dose. Call your care team if you are unable to keep an appointment. What may interact with this medication? Interactions are not expected. This list may not describe all possible interactions. Give your health care provider a list of all the medicines, herbs, non-prescription drugs, or dietary supplements you use. Also  tell them if you smoke, drink alcohol, or use illegal drugs. Some items may interact with your medicine. What should I watch for while using this medication? Visit your care team for regular checks on your progress. You may need blood work done while you are taking this medication. Your condition will be monitored carefully while you are receiving this medication. It is important not to miss any appointments. What side effects may I notice from receiving this medication? Side effects that you should report to your care team as soon as possible: Allergic reactions--skin rash, itching, hives, swelling of the face, lips, tongue, or throat Blood clot--pain, swelling, or warmth in the leg, shortness of breath, chest pain Side effects that usually do not require medical attention (report to your care team if they continue or are bothersome): Dizziness Joint pain Muscle pain Pain in the hands or feet Stomach pain Trouble sleeping This list may not describe all possible side effects. Call your doctor for medical advice about side effects. You may report side effects to FDA at 1-800-FDA-1088. Where should I keep my medication? This medication is given in a hospital or clinic. It will not be stored at home. NOTE: This sheet is a summary. It may not cover all possible information. If you have questions about this medicine, talk to your doctor, pharmacist, or health care provider.  2023 Elsevier/Gold Standard (2021-09-07 00:00:00)

## 2022-08-01 DIAGNOSIS — E119 Type 2 diabetes mellitus without complications: Secondary | ICD-10-CM | POA: Diagnosis not present

## 2022-08-01 DIAGNOSIS — H524 Presbyopia: Secondary | ICD-10-CM | POA: Diagnosis not present

## 2022-08-03 ENCOUNTER — Other Ambulatory Visit: Payer: Self-pay

## 2022-08-03 ENCOUNTER — Inpatient Hospital Stay: Payer: Medicare HMO

## 2022-08-03 VITALS — BP 128/65 | HR 71 | Resp 16

## 2022-08-03 DIAGNOSIS — Z5111 Encounter for antineoplastic chemotherapy: Secondary | ICD-10-CM | POA: Diagnosis not present

## 2022-08-03 DIAGNOSIS — C9310 Chronic myelomonocytic leukemia not having achieved remission: Secondary | ICD-10-CM

## 2022-08-03 DIAGNOSIS — D696 Thrombocytopenia, unspecified: Secondary | ICD-10-CM

## 2022-08-03 DIAGNOSIS — Z79899 Other long term (current) drug therapy: Secondary | ICD-10-CM | POA: Diagnosis not present

## 2022-08-03 DIAGNOSIS — Z9884 Bariatric surgery status: Secondary | ICD-10-CM | POA: Diagnosis not present

## 2022-08-03 DIAGNOSIS — I10 Essential (primary) hypertension: Secondary | ICD-10-CM | POA: Diagnosis not present

## 2022-08-03 DIAGNOSIS — Z95828 Presence of other vascular implants and grafts: Secondary | ICD-10-CM

## 2022-08-03 DIAGNOSIS — D693 Immune thrombocytopenic purpura: Secondary | ICD-10-CM | POA: Diagnosis not present

## 2022-08-03 DIAGNOSIS — E119 Type 2 diabetes mellitus without complications: Secondary | ICD-10-CM | POA: Diagnosis not present

## 2022-08-03 DIAGNOSIS — Z7984 Long term (current) use of oral hypoglycemic drugs: Secondary | ICD-10-CM | POA: Diagnosis not present

## 2022-08-03 LAB — CBC WITH DIFFERENTIAL (CANCER CENTER ONLY)
Abs Immature Granulocytes: 0.12 10*3/uL — ABNORMAL HIGH (ref 0.00–0.07)
Basophils Absolute: 0 10*3/uL (ref 0.0–0.1)
Basophils Relative: 1 %
Eosinophils Absolute: 0 10*3/uL (ref 0.0–0.5)
Eosinophils Relative: 0 %
HCT: 27.8 % — ABNORMAL LOW (ref 36.0–46.0)
Hemoglobin: 9.1 g/dL — ABNORMAL LOW (ref 12.0–15.0)
Immature Granulocytes: 2 %
Lymphocytes Relative: 9 %
Lymphs Abs: 0.6 10*3/uL — ABNORMAL LOW (ref 0.7–4.0)
MCH: 27.3 pg (ref 26.0–34.0)
MCHC: 32.7 g/dL (ref 30.0–36.0)
MCV: 83.5 fL (ref 80.0–100.0)
Monocytes Absolute: 1.5 10*3/uL — ABNORMAL HIGH (ref 0.1–1.0)
Monocytes Relative: 20 %
Neutro Abs: 5 10*3/uL (ref 1.7–7.7)
Neutrophils Relative %: 68 %
Platelet Count: 68 10*3/uL — ABNORMAL LOW (ref 150–400)
RBC: 3.33 MIL/uL — ABNORMAL LOW (ref 3.87–5.11)
RDW: 23.5 % — ABNORMAL HIGH (ref 11.5–15.5)
WBC Count: 7.2 10*3/uL (ref 4.0–10.5)
nRBC: 0 % (ref 0.0–0.2)

## 2022-08-03 LAB — SAMPLE TO BLOOD BANK

## 2022-08-03 MED ORDER — SODIUM CHLORIDE 0.9% FLUSH
10.0000 mL | Freq: Once | INTRAVENOUS | Status: AC
  Administered 2022-08-03: 10 mL

## 2022-08-03 MED ORDER — HEPARIN SOD (PORK) LOCK FLUSH 100 UNIT/ML IV SOLN
500.0000 [IU] | Freq: Once | INTRAVENOUS | Status: AC
  Administered 2022-08-03: 500 [IU]

## 2022-08-03 MED ORDER — ROMIPLOSTIM INJECTION 500 MCG
550.0000 ug | Freq: Once | SUBCUTANEOUS | Status: AC
  Administered 2022-08-03: 550 ug via SUBCUTANEOUS
  Filled 2022-08-03: qty 1

## 2022-08-03 NOTE — Patient Instructions (Signed)
Romiplostim Injection What is this medication? ROMIPLOSTIM (roe mi PLOE stim) treats low levels of platelets in your body caused by immune thrombocytopenia (ITP). It is prescribed when other medications have not worked or cannot be tolerated. It may also be used to help people who have been exposed to high doses of radiation. It works by increasing the amount of platelets in your blood. This lowers the risk of bleeding. This medicine may be used for other purposes; ask your health care provider or pharmacist if you have questions. COMMON BRAND NAME(S): Nplate What should I tell my care team before I take this medication? They need to know if you have any of these conditions: Blood clots Myelodysplastic syndrome An unusual or allergic reaction to romiplostim, mannitol, other medications, foods, dyes, or preservatives Pregnant or trying to get pregnant Breast-feeding How should I use this medication? This medication is injected under the skin. It is given by a care team in a hospital or clinic setting. A special MedGuide will be given to you before each treatment. Be sure to read this information carefully each time. Talk to your care team about the use of this medication in children. While it may be prescribed for children as young as newborns for selected conditions, precautions do apply. Overdosage: If you think you have taken too much of this medicine contact a poison control center or emergency room at once. NOTE: This medicine is only for you. Do not share this medicine with others. What if I miss a dose? Keep appointments for follow-up doses. It is important not to miss your dose. Call your care team if you are unable to keep an appointment. What may interact with this medication? Interactions are not expected. This list may not describe all possible interactions. Give your health care provider a list of all the medicines, herbs, non-prescription drugs, or dietary supplements you use. Also  tell them if you smoke, drink alcohol, or use illegal drugs. Some items may interact with your medicine. What should I watch for while using this medication? Visit your care team for regular checks on your progress. You may need blood work done while you are taking this medication. Your condition will be monitored carefully while you are receiving this medication. It is important not to miss any appointments. What side effects may I notice from receiving this medication? Side effects that you should report to your care team as soon as possible: Allergic reactions--skin rash, itching, hives, swelling of the face, lips, tongue, or throat Blood clot--pain, swelling, or warmth in the leg, shortness of breath, chest pain Side effects that usually do not require medical attention (report to your care team if they continue or are bothersome): Dizziness Joint pain Muscle pain Pain in the hands or feet Stomach pain Trouble sleeping This list may not describe all possible side effects. Call your doctor for medical advice about side effects. You may report side effects to FDA at 1-800-FDA-1088. Where should I keep my medication? This medication is given in a hospital or clinic. It will not be stored at home. NOTE: This sheet is a summary. It may not cover all possible information. If you have questions about this medicine, talk to your doctor, pharmacist, or health care provider.  2023 Elsevier/Gold Standard (2021-09-07 00:00:00)

## 2022-08-08 ENCOUNTER — Other Ambulatory Visit: Payer: Self-pay | Admitting: Family Medicine

## 2022-08-08 NOTE — Telephone Encounter (Signed)
Controlled substance refills need face-to-face visit at all times no exceptions

## 2022-08-08 NOTE — Telephone Encounter (Signed)
Pt is needing refill on Oxycodone. Pharmacy is correct in the system.

## 2022-08-08 NOTE — Telephone Encounter (Signed)
Requesting: oxycodone Contract:n/a UDS:n/a Last Visit:04/04/22 Next Visit:09/18/21 Last Refill:04/04/22 (60,0)  Please Advise

## 2022-08-10 ENCOUNTER — Other Ambulatory Visit: Payer: Self-pay

## 2022-08-10 ENCOUNTER — Inpatient Hospital Stay: Payer: Medicare HMO

## 2022-08-10 VITALS — BP 113/58 | HR 81 | Temp 98.8°F | Resp 16

## 2022-08-10 DIAGNOSIS — Z7984 Long term (current) use of oral hypoglycemic drugs: Secondary | ICD-10-CM | POA: Diagnosis not present

## 2022-08-10 DIAGNOSIS — C9310 Chronic myelomonocytic leukemia not having achieved remission: Secondary | ICD-10-CM | POA: Diagnosis not present

## 2022-08-10 DIAGNOSIS — E119 Type 2 diabetes mellitus without complications: Secondary | ICD-10-CM | POA: Diagnosis not present

## 2022-08-10 DIAGNOSIS — D696 Thrombocytopenia, unspecified: Secondary | ICD-10-CM

## 2022-08-10 DIAGNOSIS — Z95828 Presence of other vascular implants and grafts: Secondary | ICD-10-CM

## 2022-08-10 DIAGNOSIS — D693 Immune thrombocytopenic purpura: Secondary | ICD-10-CM | POA: Diagnosis not present

## 2022-08-10 DIAGNOSIS — I10 Essential (primary) hypertension: Secondary | ICD-10-CM | POA: Diagnosis not present

## 2022-08-10 DIAGNOSIS — Z9884 Bariatric surgery status: Secondary | ICD-10-CM | POA: Diagnosis not present

## 2022-08-10 DIAGNOSIS — Z5111 Encounter for antineoplastic chemotherapy: Secondary | ICD-10-CM | POA: Diagnosis not present

## 2022-08-10 DIAGNOSIS — Z79899 Other long term (current) drug therapy: Secondary | ICD-10-CM | POA: Diagnosis not present

## 2022-08-10 LAB — CBC WITH DIFFERENTIAL (CANCER CENTER ONLY)
Abs Immature Granulocytes: 0.03 10*3/uL (ref 0.00–0.07)
Basophils Absolute: 0 10*3/uL (ref 0.0–0.1)
Basophils Relative: 0 %
Eosinophils Absolute: 0 10*3/uL (ref 0.0–0.5)
Eosinophils Relative: 1 %
HCT: 27.3 % — ABNORMAL LOW (ref 36.0–46.0)
Hemoglobin: 8.9 g/dL — ABNORMAL LOW (ref 12.0–15.0)
Immature Granulocytes: 1 %
Lymphocytes Relative: 11 %
Lymphs Abs: 0.5 10*3/uL — ABNORMAL LOW (ref 0.7–4.0)
MCH: 27.6 pg (ref 26.0–34.0)
MCHC: 32.6 g/dL (ref 30.0–36.0)
MCV: 84.8 fL (ref 80.0–100.0)
Monocytes Absolute: 1.1 10*3/uL — ABNORMAL HIGH (ref 0.1–1.0)
Monocytes Relative: 24 %
Neutro Abs: 2.8 10*3/uL (ref 1.7–7.7)
Neutrophils Relative %: 63 %
Platelet Count: 64 10*3/uL — ABNORMAL LOW (ref 150–400)
RBC: 3.22 MIL/uL — ABNORMAL LOW (ref 3.87–5.11)
RDW: 23.2 % — ABNORMAL HIGH (ref 11.5–15.5)
WBC Count: 4.5 10*3/uL (ref 4.0–10.5)
nRBC: 0 % (ref 0.0–0.2)

## 2022-08-10 LAB — BASIC METABOLIC PANEL - CANCER CENTER ONLY
Anion gap: 5 (ref 5–15)
BUN: 11 mg/dL (ref 8–23)
CO2: 24 mmol/L (ref 22–32)
Calcium: 8.1 mg/dL — ABNORMAL LOW (ref 8.9–10.3)
Chloride: 106 mmol/L (ref 98–111)
Creatinine: 0.64 mg/dL (ref 0.44–1.00)
GFR, Estimated: >60 mL/min (ref >60)
Glucose, Bld: 262 mg/dL — ABNORMAL HIGH (ref 70–99)
Potassium: 4 mmol/L (ref 3.5–5.1)
Sodium: 135 mmol/L (ref 135–145)

## 2022-08-10 LAB — SAMPLE TO BLOOD BANK

## 2022-08-10 MED ORDER — SODIUM CHLORIDE 0.9% FLUSH
10.0000 mL | Freq: Once | INTRAVENOUS | Status: AC
  Administered 2022-08-10: 10 mL

## 2022-08-10 MED ORDER — ROMIPLOSTIM INJECTION 500 MCG
550.0000 ug | Freq: Once | SUBCUTANEOUS | Status: AC
  Administered 2022-08-10: 550 ug via SUBCUTANEOUS
  Filled 2022-08-10: qty 1.1

## 2022-08-10 MED ORDER — HEPARIN SOD (PORK) LOCK FLUSH 100 UNIT/ML IV SOLN
500.0000 [IU] | Freq: Once | INTRAVENOUS | Status: AC
  Administered 2022-08-10: 500 [IU]

## 2022-08-10 NOTE — Patient Instructions (Signed)
Romiplostim Injection What is this medication? ROMIPLOSTIM (roe mi PLOE stim) treats low levels of platelets in your body caused by immune thrombocytopenia (ITP). It is prescribed when other medications have not worked or cannot be tolerated. It may also be used to help people who have been exposed to high doses of radiation. It works by increasing the amount of platelets in your blood. This lowers the risk of bleeding. This medicine may be used for other purposes; ask your health care provider or pharmacist if you have questions. COMMON BRAND NAME(S): Nplate What should I tell my care team before I take this medication? They need to know if you have any of these conditions: Blood clots Myelodysplastic syndrome An unusual or allergic reaction to romiplostim, mannitol, other medications, foods, dyes, or preservatives Pregnant or trying to get pregnant Breast-feeding How should I use this medication? This medication is injected under the skin. It is given by a care team in a hospital or clinic setting. A special MedGuide will be given to you before each treatment. Be sure to read this information carefully each time. Talk to your care team about the use of this medication in children. While it may be prescribed for children as young as newborns for selected conditions, precautions do apply. Overdosage: If you think you have taken too much of this medicine contact a poison control center or emergency room at once. NOTE: This medicine is only for you. Do not share this medicine with others. What if I miss a dose? Keep appointments for follow-up doses. It is important not to miss your dose. Call your care team if you are unable to keep an appointment. What may interact with this medication? Interactions are not expected. This list may not describe all possible interactions. Give your health care provider a list of all the medicines, herbs, non-prescription drugs, or dietary supplements you use. Also  tell them if you smoke, drink alcohol, or use illegal drugs. Some items may interact with your medicine. What should I watch for while using this medication? Visit your care team for regular checks on your progress. You may need blood work done while you are taking this medication. Your condition will be monitored carefully while you are receiving this medication. It is important not to miss any appointments. What side effects may I notice from receiving this medication? Side effects that you should report to your care team as soon as possible: Allergic reactions--skin rash, itching, hives, swelling of the face, lips, tongue, or throat Blood clot--pain, swelling, or warmth in the leg, shortness of breath, chest pain Side effects that usually do not require medical attention (report to your care team if they continue or are bothersome): Dizziness Joint pain Muscle pain Pain in the hands or feet Stomach pain Trouble sleeping This list may not describe all possible side effects. Call your doctor for medical advice about side effects. You may report side effects to FDA at 1-800-FDA-1088. Where should I keep my medication? This medication is given in a hospital or clinic. It will not be stored at home. NOTE: This sheet is a summary. It may not cover all possible information. If you have questions about this medicine, talk to your doctor, pharmacist, or health care provider.  2023 Elsevier/Gold Standard (2021-09-07 00:00:00)

## 2022-08-10 NOTE — Telephone Encounter (Signed)
Pt scheduled  

## 2022-08-11 DIAGNOSIS — C9310 Chronic myelomonocytic leukemia not having achieved remission: Secondary | ICD-10-CM | POA: Diagnosis not present

## 2022-08-11 DIAGNOSIS — D509 Iron deficiency anemia, unspecified: Secondary | ICD-10-CM | POA: Diagnosis not present

## 2022-08-12 ENCOUNTER — Encounter: Payer: Self-pay | Admitting: Family Medicine

## 2022-08-12 ENCOUNTER — Ambulatory Visit (INDEPENDENT_AMBULATORY_CARE_PROVIDER_SITE_OTHER): Payer: Medicare HMO | Admitting: Family Medicine

## 2022-08-12 VITALS — BP 120/86 | HR 74 | Temp 98.4°F | Wt 145.8 lb

## 2022-08-12 DIAGNOSIS — E782 Mixed hyperlipidemia: Secondary | ICD-10-CM

## 2022-08-12 DIAGNOSIS — M549 Dorsalgia, unspecified: Secondary | ICD-10-CM

## 2022-08-12 DIAGNOSIS — S22000A Wedge compression fracture of unspecified thoracic vertebra, initial encounter for closed fracture: Secondary | ICD-10-CM

## 2022-08-12 DIAGNOSIS — Z5320 Procedure and treatment not carried out because of patient's decision for unspecified reasons: Secondary | ICD-10-CM

## 2022-08-12 DIAGNOSIS — I10 Essential (primary) hypertension: Secondary | ICD-10-CM

## 2022-08-12 DIAGNOSIS — E1142 Type 2 diabetes mellitus with diabetic polyneuropathy: Secondary | ICD-10-CM | POA: Diagnosis not present

## 2022-08-12 DIAGNOSIS — G4700 Insomnia, unspecified: Secondary | ICD-10-CM

## 2022-08-12 LAB — POCT GLYCOSYLATED HEMOGLOBIN (HGB A1C)
HbA1c POC (<> result, manual entry): 6.7 % (ref 4.0–5.6)
HbA1c, POC (controlled diabetic range): 6.7 % (ref 0.0–7.0)
HbA1c, POC (prediabetic range): 6.7 % — AB (ref 5.7–6.4)
Hemoglobin A1C: 6.7 % — AB (ref 4.0–5.6)

## 2022-08-12 MED ORDER — OXYCODONE HCL 10 MG PO TABS: 10.0000 mg | ORAL_TABLET | Freq: Two times a day (BID) | ORAL | 0 refills | Status: DC | PRN

## 2022-08-12 MED ORDER — METOPROLOL SUCCINATE ER 25 MG PO TB24: 25.0000 mg | ORAL_TABLET | Freq: Every day | ORAL | 1 refills | Status: DC

## 2022-08-12 MED ORDER — PREGABALIN 150 MG PO CAPS: 150.0000 mg | ORAL_CAPSULE | Freq: Two times a day (BID) | ORAL | 1 refills | Status: DC

## 2022-08-12 MED ORDER — SITAGLIPTIN PHOS-METFORMIN HCL 50-1000 MG PO TABS: 1.0000 | ORAL_TABLET | Freq: Every day | ORAL | 1 refills | Status: DC

## 2022-08-12 NOTE — Patient Instructions (Signed)
Return in about 4 months (around 12/26/2022) for Routine chronic condition follow-up.        Great to see you today.  I have refilled the medication(s) we provide.   If labs were collected, we will inform you of lab results once received either by echart message or telephone call.   - echart message- for normal results that have been seen by the patient already.   - telephone call: abnormal results or if patient has not viewed results in their echart.

## 2022-08-12 NOTE — Progress Notes (Signed)
Kathleen Gibson , [birth year], 72 y.o., female MRN: [id number] Patient Care Team    Relationship Specialty Notifications Start End  [name], DO PCP - General Family Medicine  04/05/22   Doran Stabler, MD Consulting Physician Gastroenterology  04/06/17   Truitt Merle, MD Consulting Physician Hematology  04/06/17   Alda Berthold, [place] Physician Neurology  01/16/19     Chief Complaint  Patient presents with   Diabetes     Subjective:  Kathleen Gibson  is a 72 y.o. female presents for rightChronic Conditions/illness Management  INSOMNIA, CHRONIC/RLS Ambien being  placed on hold for now during use of Flexeril and chronic opioids.  She reports she sleeping better  Thrombocytopenia (HCC)/Chronic myelomonocytic leukemia not having achieved remission ([place]) Has routine follow-ups with her oncology team.  Intractable back pain/Compression fracture of body of thoracic vertebra ([place]) Patient continues to require opioid for discomfort due to her compression fracture and back pain, but not as frequently. She reports she typically only takes before bed.  She is compliant with Lyrica and feels it is working well.  Type 2 diabetes mellitus with diabetic polyneuropathy, without long-term current use of insulin (HCC)/hyperlipidemia She reports compliance with Janumet 50-1000 mcg daily.. Patient denies dizziness, hyperglycemic or hypoglycemic events. Patient denies numbness, tingling in the extremities or nonhealing wounds of feet.    Hypertension: Patient reports compliance with metoprolol 25 mg daily.  Patient denies chest pain, shortness of breath, dizziness or lower extremity edema.     Recent Labs  Lab 08/10/22 1402  HGB 8.9*  HCT 27.3*  WBC 4.5  PLT 64*      Latest Ref Rng & Units 08/10/2022    2:02 PM 07/27/2022    2:05 PM 07/18/2022    9:27 AM  CMP  Glucose 70 - 99 mg/dL 262  182  179   BUN 8 - 23 mg/dL '11  12  14   '$ Creatinine 0.44 - 1.00 mg/dL 0.64  0.70  0.58    Sodium 135 - 145 mmol/L 135  137  141   Potassium 3.5 - 5.1 mmol/L 4.0  3.7  3.9   Chloride 98 - 111 mmol/L 106  107  109   CO2 22 - 32 mmol/L '24  25  27   '$ Calcium 8.9 - 10.3 mg/dL 8.1  8.0  8.5   Total Protein 6.5 - 8.1 g/dL  6.6    Total Bilirubin 0.3 - 1.2 mg/dL  1.4    Alkaline Phos 38 - 126 U/L  123    AST 15 - 41 U/L  28    ALT 0 - 44 U/L  31     No results found.     09/07/2021    9:16 AM 03/26/2021   11:29 AM 12/22/2020    3:34 PM 10/28/2020    9:13 AM 05/22/2020    8:02 AM  Depression screen PHQ 2/9  Decreased Interest 0 0 2 1 0  Down, Depressed, Hopeless 0 0 1 1 0  PHQ - 2 Score 0 0 3 2 0  Altered sleeping   3 0   Tired, decreased energy   1 3   Change in appetite   0 2   Feeling bad or failure about yourself    0 1   Trouble concentrating   1 2   Moving slowly or fidgety/restless   0 1   Suicidal thoughts   0 0   PHQ-9  Score   8 11     Allergies  Allergen Reactions   Asa [Aspirin] Other (See Comments)    Contraindicated d/t low plts   Nsaids Other (See Comments)    Contraindicated d/t low plts   Red Blood Cells Nausea Only and Other (See Comments)    Near end of transfusion pt developed nausea, facial flushing, and tachycardia. See progress note from 03/01/22   Social History   Tobacco Use   Smoking status: Never    Passive exposure: Never   Smokeless tobacco: Never  Substance Use Topics   Alcohol use: No   Past Medical History:  Diagnosis Date   Acute bronchitis due to COVID-19 virus 05/16/2022   Diabetes ([place])    HLD (hyperlipidemia)    Hypertension    Pernicious anemia    Pneumonia 09/05/2019   Renal insufficiency 09/06/2019   Vitamin D deficiency    Past Surgical History:  Procedure Laterality Date   ABDOMINAL HERNIA REPAIR  2009   ABDOMINOPLASTY     AXILLARY LYMPH NODE BIOPSY Left 11/11/2020   Procedure: LEFT AXILLARY EXCISIONAL LYMPH NODE BIOPSY;  Surgeon: [name], MD;  Location: [facility];  Service: General;  Laterality: Left;    [place]   GASTRIC BYPASS  2000   hemorroid surgery  2007   IR IMAGING GUIDED PORT INSERTION  07/26/2021   IR KYPHO EA ADDL LEVEL THORACIC OR LUMBAR  05/31/2021   IR KYPHO THORACIC WITH BONE BIOPSY  05/31/2021   TONSILLECTOMY AND ADENOIDECTOMY  1957   Family History  Problem Relation Age of Onset   Cancer Mother 61       unknown type cancer    Hypertension Mother    Hypertension Father    Heart failure Father    Pneumonia Father    Diabetes Sister    Leukemia Brother    Colon cancer Brother 60   Heart Problems Brother    Leukemia Other    Diabetes Sister    Heart Problems Sister    Cancer Brother    Heart Problems Brother    Allergies as of 08/12/2022       Reactions   Asa [aspirin] Other (See Comments)   Contraindicated d/t low plts   Nsaids Other (See Comments)   Contraindicated d/t low plts   Red Blood Cells Nausea Only, Other (See Comments)   Near end of transfusion pt developed nausea, facial flushing, and tachycardia. See progress note from 03/01/22        Medication List        Accurate as of August 12, 2022  8:49 AM. If you have any questions, ask your nurse or doctor.          cyanocobalamin 1000 MCG/ML injection Commonly known as: VITAMIN B12 Inject 1,000 mcg into the muscle every 30 (thirty) days.   cyclobenzaprine 5 MG tablet Commonly known as: FLEXERIL Take 1 tablet (5 mg total) by mouth 3 (three) times daily as needed for muscle spasms.   furosemide 20 MG tablet Commonly known as: LASIX TAKE 1 TABLET BY MOUTH EVERY DAY FOR UP TO 7 DAYS THEN AS NEEDED FOR LEG EDEMA   Klor-Con M10 10 MEQ tablet Generic drug: potassium chloride TAKE 1 TABLET BY MOUTH TWICE A DAY TAKE ALONG WITH FUROSEMIDE ONLY   lidocaine-prilocaine cream Commonly known as: EMLA APPLY 2 GRAMS TO PORT-A-CATH SITE 30-60 MINUTES PRIOR TO PORT ACCESS AS NEEDED   metoprolol succinate 25 MG 24 hr tablet Commonly known  as: TOPROL-XL Take 1 tablet (25 mg total) by  mouth daily.   Narcan 4 MG/0.1ML Liqd nasal spray kit Generic drug: naloxone Place 1 spray into the nose once as needed (overdose).   ondansetron 4 MG disintegrating tablet Commonly known as: ZOFRAN-ODT Take 1 tablet (4 mg total) by mouth every 8 (eight) hours as needed for nausea or vomiting.   onetouch ultrasoft lancets Monitor blood sugars twice daily   OneTouch Verio test strip Generic drug: glucose blood Monitor blood sugars twice daily   Oxycodone HCl 10 MG Tabs Take 1 tablet (10 mg total) by mouth 2 (two) times daily as needed. What changed: Another medication with the same name was added. Make sure you understand how and when to take each. Changed by: Howard Pouch, DO   Oxycodone HCl 10 MG Tabs Take 1 tablet (10 mg total) by mouth 2 (two) times daily as needed. What changed: You were already taking a medication with the same name, and this prescription was added. Make sure you understand how and when to take each. Changed by: Howard Pouch, DO   Oxycodone HCl 10 MG Tabs Take 1 tablet (10 mg total) by mouth 2 (two) times daily as needed. What changed: You were already taking a medication with the same name, and this prescription was added. Make sure you understand how and when to take each. Changed by: Howard Pouch, DO   polyethylene glycol powder 17 GM/SCOOP powder Commonly known as: GLYCOLAX/MIRALAX Dissolve 1 capful (17 g) in water and drink 2 (two) times daily. What changed: when to take this   pregabalin 150 MG capsule Commonly known as: Lyrica Take 1 capsule (150 mg total) by mouth 2 (two) times daily.   promethazine-dextromethorphan 6.25-15 MG/5ML syrup Commonly known as: PROMETHAZINE-DM Take 5 mLs by mouth 4 (four) times daily as needed.   senna-docusate 8.6-50 MG tablet Commonly known as: Senokot-S Take 2 tablets by mouth at bedtime as needed for mild constipation.   sitaGLIPtin-metformin 50-1000 MG tablet Commonly known as: JANUMET Take 1 tablet by  mouth at bedtime.        All past medical history, surgical history, allergies, family history, immunizations and medications were updated in the EMR today and reviewed under the history and medication portions of their EMR.      ROS: Negative, with the exception of above mentioned in HPI   Objective:  BP 120/86   Pulse 74   Temp 98.4 F (36.9 C)   Wt 145 lb 12.8 oz (66.1 kg)   SpO2 100%   BMI 25.83 kg/m  Body mass index is 25.83 kg/m. Physical Exam Vitals and nursing note reviewed.  Constitutional:      General: She is not in acute distress.    Appearance: Normal appearance. She is not ill-appearing, toxic-appearing or diaphoretic.  HENT:     Head: Normocephalic and atraumatic.  Eyes:     General: No scleral icterus.       Right eye: No discharge.        Left eye: No discharge.     Extraocular Movements: Extraocular movements intact.     Conjunctiva/sclera: Conjunctivae normal.     Pupils: Pupils are equal, round, and reactive to light.  Cardiovascular:     Rate and Rhythm: Normal rate and regular rhythm.  Pulmonary:     Effort: Pulmonary effort is normal. No respiratory distress.     Breath sounds: Normal breath sounds. No wheezing, rhonchi or rales.  Musculoskeletal:     Right  lower leg: No edema.     Left lower leg: No edema.  Skin:    General: Skin is warm and dry.     Coloration: Skin is not jaundiced or pale.     Findings: No erythema or rash.  Neurological:     Mental Status: She is alert and oriented to person, place, and time. Mental status is at baseline.     Motor: No weakness.     Gait: Gait normal.  Psychiatric:        Mood and Affect: Mood normal.        Behavior: Behavior normal.        Thought Content: Thought content normal.        Judgment: Judgment normal.      Assessment/Plan: NARAYA HACKERT is a 72 y.o. female present for OV for Chronic Conditions/illness Management ANEMIA, PERNICIOUS Continue B12 injections every 4 weeks  indefinitely-Per oncology team has been completing these for her since she has been getting infusions as well for her leukemia. We will be happy to complete here once she is finished with infusions.  If she desires.  INSOMNIA, CHRONIC Ambien being  placed on hold for now during use of muscle relaxers and opioids. Strongly warned her of OD risk with benzo and opiate classes together.  She will try to decrease her ativan dose and will separate doses of the two meds.  Thrombocytopenia (HCC)/Chronic myelomonocytic leukemia not having achieved remission ([place]) Has routine follow-ups with her oncology team.  Intractable back pain/Compression fracture of body of thoracic vertebra ([place]) stable Continue oxy twice daily as needed, routinely using before bed daily and only if needed during the day. Patient was encouraged to continue Senokot 1-2 tabs nightly to avoid constipation with opiate use. Continue Lyrica 150 mg twice daily   Type 2 diabetes mellitus with diabetic polyneuropathy, without long-term current use of insulin (HCC)/hyperlipidemia Stable Continue Janumet 50-1000 mg daily - POCT HgB A1C>7.3 > 7.4> 6.7 Hypertension: Stable Encouraged her to hydrate. Continue metoprolol 25 mg daily.    Hereditary and idiopathic peripheral neuropathy Continue Lyrica Continue B12  Return in about 4 months (around 12/26/2022) for Routine chronic condition follow-up.   Reviewed expectations re: course of current medical issues. Discussed self-management of symptoms. Outlined signs and symptoms indicating need for more acute intervention. Patient verbalized understanding and all questions were answered. Patient received an After-Visit Summary. Any changes in medications were reviewed and patient was provided with updated med list with their AVS.     Orders Placed This Encounter  Procedures   POCT HgB A1C   Meds ordered this encounter  Medications   Oxycodone HCl 10 MG TABS    Sig: Take 1  tablet (10 mg total) by mouth 2 (two) times daily as needed.    Dispense:  60 tablet    Refill:  0    #1:3   metoprolol succinate (TOPROL-XL) 25 MG 24 hr tablet    Sig: Take 1 tablet (25 mg total) by mouth daily.    Dispense:  90 tablet    Refill:  1   pregabalin (LYRICA) 150 MG capsule    Sig: Take 1 capsule (150 mg total) by mouth 2 (two) times daily.    Dispense:  180 capsule    Refill:  1   sitaGLIPtin-metformin (JANUMET) 50-1000 MG tablet    Sig: Take 1 tablet by mouth at bedtime.    Dispense:  90 tablet    Refill:  1   Oxycodone  HCl 10 MG TABS    Sig: Take 1 tablet (10 mg total) by mouth 2 (two) times daily as needed.    Dispense:  60 tablet    Refill:  0    #2:3   Oxycodone HCl 10 MG TABS    Sig: Take 1 tablet (10 mg total) by mouth 2 (two) times daily as needed.    Dispense:  60 tablet    Refill:  0    #3:3    Note is dictated utilizing voice recognition software. Although note has been proof read prior to signing, occasional typographical errors still can be missed. If any questions arise, please do not hesitate to call for verification.   electronically signed by:  Howard Pouch, DO  [place]

## 2022-08-13 ENCOUNTER — Other Ambulatory Visit: Payer: Self-pay

## 2022-08-14 NOTE — Assessment & Plan Note (Signed)
-  diagnosed in 10/2020, presented with severe thrombocytopenia  --She began azacitidine daily days 1-5 q. 28 days on 12/28/20 -She had worsening thrombocytopenia after stopping Nplate, restarted  -Bone marrow biopsy 03/18/21 showed stable disease, no increase in blasts. -she is tolerating treatment well, blood counts are stable  

## 2022-08-15 ENCOUNTER — Inpatient Hospital Stay: Payer: Medicare HMO

## 2022-08-15 ENCOUNTER — Encounter: Payer: Self-pay | Admitting: Hematology

## 2022-08-15 ENCOUNTER — Inpatient Hospital Stay: Payer: Medicare HMO | Attending: Hematology | Admitting: Hematology

## 2022-08-15 ENCOUNTER — Other Ambulatory Visit: Payer: Self-pay

## 2022-08-15 VITALS — BP 105/55 | HR 75 | Temp 98.5°F | Resp 17

## 2022-08-15 VITALS — BP 109/62 | HR 77 | Temp 98.3°F | Resp 18 | Ht 63.0 in | Wt 146.6 lb

## 2022-08-15 DIAGNOSIS — Z5111 Encounter for antineoplastic chemotherapy: Secondary | ICD-10-CM | POA: Diagnosis present

## 2022-08-15 DIAGNOSIS — E119 Type 2 diabetes mellitus without complications: Secondary | ICD-10-CM | POA: Diagnosis not present

## 2022-08-15 DIAGNOSIS — Z7984 Long term (current) use of oral hypoglycemic drugs: Secondary | ICD-10-CM | POA: Insufficient documentation

## 2022-08-15 DIAGNOSIS — Z95828 Presence of other vascular implants and grafts: Secondary | ICD-10-CM

## 2022-08-15 DIAGNOSIS — C9310 Chronic myelomonocytic leukemia not having achieved remission: Secondary | ICD-10-CM | POA: Insufficient documentation

## 2022-08-15 DIAGNOSIS — D696 Thrombocytopenia, unspecified: Secondary | ICD-10-CM

## 2022-08-15 DIAGNOSIS — I10 Essential (primary) hypertension: Secondary | ICD-10-CM | POA: Diagnosis not present

## 2022-08-15 DIAGNOSIS — Z9884 Bariatric surgery status: Secondary | ICD-10-CM | POA: Diagnosis not present

## 2022-08-15 DIAGNOSIS — D693 Immune thrombocytopenic purpura: Secondary | ICD-10-CM | POA: Diagnosis present

## 2022-08-15 DIAGNOSIS — E538 Deficiency of other specified B group vitamins: Secondary | ICD-10-CM | POA: Diagnosis not present

## 2022-08-15 DIAGNOSIS — Z79899 Other long term (current) drug therapy: Secondary | ICD-10-CM | POA: Diagnosis not present

## 2022-08-15 DIAGNOSIS — E785 Hyperlipidemia, unspecified: Secondary | ICD-10-CM | POA: Insufficient documentation

## 2022-08-15 LAB — CBC WITH DIFFERENTIAL (CANCER CENTER ONLY)
Abs Immature Granulocytes: 0.02 10*3/uL (ref 0.00–0.07)
Basophils Absolute: 0 10*3/uL (ref 0.0–0.1)
Basophils Relative: 1 %
Eosinophils Absolute: 0 10*3/uL (ref 0.0–0.5)
Eosinophils Relative: 1 %
HCT: 26.5 % — ABNORMAL LOW (ref 36.0–46.0)
Hemoglobin: 8.6 g/dL — ABNORMAL LOW (ref 12.0–15.0)
Immature Granulocytes: 1 %
Lymphocytes Relative: 13 %
Lymphs Abs: 0.4 10*3/uL — ABNORMAL LOW (ref 0.7–4.0)
MCH: 27.7 pg (ref 26.0–34.0)
MCHC: 32.5 g/dL (ref 30.0–36.0)
MCV: 85.2 fL (ref 80.0–100.0)
Monocytes Absolute: 0.9 10*3/uL (ref 0.1–1.0)
Monocytes Relative: 26 %
Neutro Abs: 2 10*3/uL (ref 1.7–7.7)
Neutrophils Relative %: 58 %
Platelet Count: 77 10*3/uL — ABNORMAL LOW (ref 150–400)
RBC: 3.11 MIL/uL — ABNORMAL LOW (ref 3.87–5.11)
RDW: 22.5 % — ABNORMAL HIGH (ref 11.5–15.5)
WBC Count: 3.4 10*3/uL — ABNORMAL LOW (ref 4.0–10.5)
nRBC: 0 % (ref 0.0–0.2)

## 2022-08-15 LAB — COMPREHENSIVE METABOLIC PANEL
ALT: 17 U/L (ref 0–44)
AST: 19 U/L (ref 15–41)
Albumin: 3.1 g/dL — ABNORMAL LOW (ref 3.5–5.0)
Alkaline Phosphatase: 131 U/L — ABNORMAL HIGH (ref 38–126)
Anion gap: 5 (ref 5–15)
BUN: 12 mg/dL (ref 8–23)
CO2: 28 mmol/L (ref 22–32)
Calcium: 8.3 mg/dL — ABNORMAL LOW (ref 8.9–10.3)
Chloride: 108 mmol/L (ref 98–111)
Creatinine, Ser: 0.61 mg/dL (ref 0.44–1.00)
GFR, Estimated: >60 mL/min (ref >60)
Glucose, Bld: 181 mg/dL — ABNORMAL HIGH (ref 70–99)
Potassium: 4 mmol/L (ref 3.5–5.1)
Sodium: 141 mmol/L (ref 135–145)
Total Bilirubin: 1.3 mg/dL — ABNORMAL HIGH (ref 0.3–1.2)
Total Protein: 6 g/dL — ABNORMAL LOW (ref 6.5–8.1)

## 2022-08-15 LAB — SAMPLE TO BLOOD BANK

## 2022-08-15 MED ORDER — SODIUM CHLORIDE 0.9 % IV SOLN: Freq: Once | INTRAVENOUS | Status: AC

## 2022-08-15 MED ORDER — SODIUM CHLORIDE 0.9% FLUSH
10.0000 mL | INTRAVENOUS | Status: DC | PRN
  Administered 2022-08-15: 10 mL

## 2022-08-15 MED ORDER — SODIUM CHLORIDE 0.9 % IV SOLN
10.0000 mg | Freq: Once | INTRAVENOUS | Status: AC
  Administered 2022-08-15: 10 mg via INTRAVENOUS
  Filled 2022-08-15: qty 10

## 2022-08-15 MED ORDER — SODIUM CHLORIDE 0.9 % IV SOLN
10.0000 mg | Freq: Once | INTRAVENOUS | Status: DC
  Filled 2022-08-15: qty 1

## 2022-08-15 MED ORDER — HEPARIN SOD (PORK) LOCK FLUSH 100 UNIT/ML IV SOLN
500.0000 [IU] | Freq: Once | INTRAVENOUS | Status: AC | PRN
  Administered 2022-08-15: 500 [IU]

## 2022-08-15 MED ORDER — CYANOCOBALAMIN 1000 MCG/ML IJ SOLN
1000.0000 ug | Freq: Once | INTRAMUSCULAR | Status: AC
  Administered 2022-08-15: 1000 ug via INTRAMUSCULAR
  Filled 2022-08-15: qty 1

## 2022-08-15 MED ORDER — SODIUM CHLORIDE 0.9 % IV SOLN
75.0000 mg/m2 | Freq: Once | INTRAVENOUS | Status: AC
  Administered 2022-08-15: 130 mg via INTRAVENOUS
  Filled 2022-08-15: qty 13

## 2022-08-15 MED ORDER — SODIUM CHLORIDE 0.9% FLUSH
10.0000 mL | Freq: Once | INTRAVENOUS | Status: AC
  Administered 2022-08-15: 10 mL

## 2022-08-15 NOTE — Progress Notes (Signed)
Kathleen Gibson   [phone] [phone]   Clinic Follow up Note   Patient Care Team: [name], DO as PCP - General (Family Medicine) Loletha Carrow Kirke Corin, MD as Consulting Physician (Gastroenterology) Truitt Merle, MD as Consulting Physician (Hematology) Alda Berthold, DO as Consulting Physician (Neurology)  Date of Service:  08/15/2022  CHIEF COMPLAINT: f/u of  [name], severe thrombocytopenia   CURRENT THERAPY:  Azacitidine days 1-5 q28 days, started 12/28/20 Nplate 53mg/kg weekly Platelet transfusion as needed (plt<15K), blood transfusion if Hg<8.0   ASSESSMENT:  Kathleen STICKNEYis a 72y.o. female with   [name] (chronic myelomonocytic [place]) [place] -diagnosed in 10/2020, presented with severe thrombocytopenia  --She began azacitidine daily days 1-5 q. 28 days on 12/28/20 -She had worsening thrombocytopenia after stopping Nplate, restarted  -Bone marrow biopsy 03/18/21 showed stable disease, no increase in blasts. -she is tolerating treatment well, blood counts are stable  -Dr. PLinus Ornchecked her iron study last week and it was slightly low, he reccommended iv iron -Given her previous multiple blood transfusions, I will give 1 dose IV Venofer 400 mg -Continue weekly lab and Nplate injection -Zometa has been held due to her dental issue, I again encouraged her to have dental appointment.  She has a number to call.   PLAN: -lab reviewed- Iron level is low Platelet count 77 -Discuss IV Venofer 400 mg one dose next week  -Check Iron level q3 months,and B12 q3 months -proceed with Vidaza today and daily for 5 days this week -f/ in 1 month before next cyle    SUMMARY OF ONCOLOGIC HISTORY: Oncology History  [name] (chronic myelomonocytic leukemia) ([place]  10/29/2020 Imaging   CT CAP  IMPRESSION: 1. Splenomegaly with pathologically enlarged lymph nodes above and below the diaphragm, with overall stable to minimally increased abdominal adenopathy and  interval progression of the pelvic adenopathy. 2. Small volume abdominopelvic ascites with diffuse mesenteric stranding. 3. Scattered bilateral pulmonary micro nodules measuring 1-2 mm. 4. Distended gallbladder with some layering hyperdense material representing layering sludge and tiny stones seen on prior ultrasound. 5. Aortic atherosclerosis.   10/30/2020 Pathology Results   DIAGNOSIS:   BONE MARROW, ASPIRATE, CLOT, CORE:  -  Hypercellular bone marrow with panhyperplasia, atypia, and no  increase in blasts  -  See comment   PERIPHERAL BLOOD:  -  Marked thrombocytopenia  -  Absolute monocytosis  -  Normocytic anemia  -  See CBC data and comment   COMMENT:  The bone marrow is hypercellular for age (approximately 80%) with myeloid hyperplasia with maturational left shift, erythroid hyperplasia, and increased megakaryocytes.  Mild multilineage atypia is present. Blasts are not increased on aspirate smears (1% by manual differential count) or by CD34 immunohistochemistry on the core biopsy.  Concurrent flow cytometric analysis of the bone marrow aspirate demonstrates increased monocytes, and no increase in blasts or abnormal lymphoid population (see [id number].  Monocytes are also increased in peripheral  blood, persistent per electronic medical record.  In aggregate, the  findings raise the possibility of a myeloid neoplasm with the  differential diagnosis including a low-grade myelodysplastic syndrome and chronic myelomonocytic leukemia (dysplastic type).    ADDENDUM:  A reticulin special stain performed on the bone marrow core biopsy reveals no significant increase in reticulin fibrosis.  ADDENDUM:  CYTOGENETIC RESULTS:  Karyotype: 46,XX[20]  Interpretation: NORMAL FEMALE KARYOTYPE   FISH RESULTS:  Results: NORMAL   ADDENDUM:  CD123 immunohistochemistry performed on the core biopsy highlights scattered  aggregates of positively staining cells consistent with plasmacytoid  dendritic cells.    10/30/2020 Pathology Results   DIAGNOSIS:   BONE MARROW; FLOW CYTOMETRIC ANALYSIS:  -  Increased monocytes  -  Scant B-cells present  -  No immunophenotypically aberrant T-cell population identified  -  No increase in blasts  -  See comment   COMMENT:  Monocytes are relatively increased (12% of all cells), without aberrant expression of CD56.  B-cells comprise <1% of total lymphocytes. CD34-positive blasts are not increased (<1% of all cells).  Correlation with concurrent morphology is recommended for complete diagnostic interpretation and overall blast enumeration (see [id number]).    10/30/2020 Pathology Results   FINAL MICROSCOPIC DIAGNOSIS:   A. LYMPH NODE, RIGHT AXILLARY, NEEDLE CORE BIOPSY:  -Lymphoid tissue present  -See comment   COMMENT:  The sections show several small needle core biopsy fragments of lymphoid tissue displaying degenerative cellular changes/necrosis and hence cannot be accurately evaluated.  Sample for flow cytometric analysis not available.  Immunohistochemical stain for CD3 and CD20 were performed with appropriate controls.  There is a mixture of T and B cells in their apparently respective compartments.  There is no definite metastatic malignancy.    11/11/2020 Pathology Results   DIAGNOSIS:   LEFT AXILLARY LYMPH NODE EXCISIONAL BIOPSY; FLOW CYTOMETRIC ANALYSIS:  -  No monotypic B-cell or immunophenotypically aberrant T-cell  population identified  -  See comment   COMMENT:  Flow cytometric analysis identifies B-cells with a normal kappa:lambda ratio of 1.7:1.  A subset of the polytypic B-cells expresses CD10. T-cells show a CD4:CD8 ratio of 3.3:1 without immunophenotypic aberrancy with the markers evaluated.  Although these results do not support the diagnosis of a clonal lymphoid population, sampling issues must always be considered when negative results are obtained, as focal lesions may not be represented in the specimen submitted.   In addition, flow cytometric immunophenotyping will not exclude other pathology if present (e.g. Hodgkin lymphoma, some T-cell lymphomas, metastatic and infectious diseases).   11/11/2020 Pathology Results   FINAL MICROSCOPIC DIAGNOSIS:   A. LYMPH NODE, LEFT AXILLARY, DISSECTION:  -  Follicular hyperplasia with interfollicular expansion  -  See comment   COMMENT:  Sections of the lymph nodes reveal generally preserved lymph node architecture.  There is follicular hyperplasia with some follicles showing increased hyalinization of germinal centers and mild concentric encircling of germinal centers with small lymphocytes (onion skinning). Interfollicular areas are expanded with increased vascular proliferation, small lymphocytes, plasma cells, histiocytic cells, and patchy neutrophils.  The lymph node capsule is variably thickened by fibrosis with few plasma cells.   A panel of immunohistochemical stains is performed for further  characterization.  CD3 and CD20/PAX5 highlight T-cell and B-cell  compartments, respectively.  Germinal centers are highlighted by CD10 and BCL6 with appropriate absent expression of BCL2.  CD5 is similar to CD3.  CD43 also stains the T-cells.  CD4-positive T-cells exceed CD8-positive T-cells.  CD30 highlights scattered immunoblasts.  CD21 reveals intact follicular dendritic cell meshworks.  CD68 highlights increased histiocytic cells.  HHV 8 is negative.  T. pallidum reveals no definitive organisms.  Pancytokeratin is negative.  TdT stains rare cells.  CD138 highlights plasma cells which are not increased in number and show polytypic light chain expression by kappa/lambda in situ  hybridization.  EBV is negative by in situ hybridization.   Concurrent flow cytometric analysis is negative for a monoclonal B-cell or immunophenotypically aberrant T-cell population (see [id number]).   Together, the findings above are non-specific  and can be seen in  reactive and infectious  processes as well as Castleman disease.  There is no definitive morphologic or flow cytometric evidence of involvement by a lymphoproliferative disorder from the current workup.   12/17/2020 Initial Diagnosis   [name] (chronic myelomonocytic leukemia) ([place])   12/28/2020 - 02/03/2022 Chemotherapy   Patient is on Treatment Plan : MYELODYSPLASIA  Azacitidine IV D1-5 q28d     03/18/2021 Pathology Results   Final Diagnosis    BONE MARROW:             Persistent chronic myelomonocytic leukemia in a hypercellular bone marrow with increased monocytes and no increase in blasts.   Comment    Flow cytometric analysis showed no increase in blasts and 17% monocytes.A next generation myeloid panel showed the presence of the following mutations: NRAS, SH2B3, PHF6 and TET2. CD34 and CD117 immunstains do not show an increase in blasts. The findings are consistent with persistent [name] with a decrease in the number of blasts compared to that seen on the previous bone marrow.    Final Interpretation      BONE MARROW; FLOW CYTOMETRIC ANALYSIS:   No increased blasts identified. (see comment)  Monocytosis (17% of total events).    COMMENT: Flow cytometry identified about 0.6% of total events as immature cells. These cells express CD45 (dim), CD34, CD117, HLA-DR, CD38, CD33 (variable). This immunophenotype is consistent with normal myeloblasts. In addition, monocytes are increased and comprise of approximately 17% of total events. These monocytes show normal expression of CD33/CD64/CD14/CD11b with variable expression of CD4. Correlation with concurrent morphology and clinical data is recommended (see [id number]).    FLOW CYTOMETRY ANALYSIS: CD45 versus side scatter analysis demonstrate a predominance of granulocytes (~70%), monocytes (~17%) and lymphocytes (~6%). No significant increase in blasts identified. All tested markers were used for adequate analysis of the cells and were appropriately reviewed.     12/28/2021  -  Chemotherapy   Patient is on Treatment Plan : MYELODYSPLASIA  Azacitidine IV D1-5 q28d        INTERVAL HISTORY:  Kathleen Gibson is here for a follow up of  [name], severe thrombocytopenia  She was last seen by me on 07/18/2022 She presents to the clinic lone. Pt state she only takes 1 oxycodone at night. Pt denies having bruising and bleeding. Pt reports of being dizzy   All other systems were reviewed with the patient and are negative.  MEDICAL HISTORY:  Past Medical History:  Diagnosis Date   Acute bronchitis due to COVID-19 virus 05/16/2022   Diabetes ([place])    HLD (hyperlipidemia)    Hypertension    Pernicious anemia    Pneumonia 09/05/2019   Renal insufficiency 09/06/2019   Vitamin D deficiency     SURGICAL HISTORY: Past Surgical History:  Procedure Laterality Date   ABDOMINAL HERNIA REPAIR  2009   ABDOMINOPLASTY     AXILLARY LYMPH NODE BIOPSY Left 11/11/2020   Procedure: LEFT AXILLARY EXCISIONAL LYMPH NODE BIOPSY;  Surgeon: [name], MD;  Location: [facility];  Service: General;  Laterality: Left;   [place]   GASTRIC BYPASS  2000   hemorroid surgery  2007   IR IMAGING GUIDED PORT INSERTION  07/26/2021   IR KYPHO EA ADDL LEVEL THORACIC OR LUMBAR  05/31/2021   IR KYPHO THORACIC WITH BONE BIOPSY  05/31/2021   TONSILLECTOMY AND ADENOIDECTOMY  1957    I have reviewed the social history and family history with the patient and they are  unchanged from previous note.  ALLERGIES:  is allergic to asa [aspirin], nsaids, and red blood cells.  MEDICATIONS:  Current Outpatient Medications  Medication Sig Dispense Refill   cyanocobalamin (,VITAMIN B-12,) 1000 MCG/ML injection Inject 1,000 mcg into the muscle every 30 (thirty) days.     cyclobenzaprine (FLEXERIL) 5 MG tablet Take 1 tablet (5 mg total) by mouth 3 (three) times daily as needed for muscle spasms. 30 tablet 0   furosemide (LASIX) 20 MG tablet TAKE 1 TABLET BY MOUTH EVERY DAY FOR UP TO 7 DAYS THEN AS NEEDED  FOR LEG EDEMA 90 tablet 1   glucose blood (ONETOUCH VERIO) test strip Monitor blood sugars twice daily 100 each 5   KLOR-CON M10 10 MEQ tablet TAKE 1 TABLET BY MOUTH TWICE A DAY TAKE ALONG WITH FUROSEMIDE ONLY 180 tablet 1   Lancets (ONETOUCH ULTRASOFT) lancets Monitor blood sugars twice daily 100 each 5   lidocaine-prilocaine (EMLA) cream APPLY 2 GRAMS TO PORT-A-CATH SITE 30-60 MINUTES PRIOR TO PORT ACCESS AS NEEDED 30 g 1   metoprolol succinate (TOPROL-XL) 25 MG 24 hr tablet Take 1 tablet (25 mg total) by mouth daily. 90 tablet 1   naloxone (NARCAN) nasal spray 4 mg/0.1 mL Place 1 spray into the nose once as needed (overdose). 2 each 0   ondansetron (ZOFRAN-ODT) 4 MG disintegrating tablet Take 1 tablet (4 mg total) by mouth every 8 (eight) hours as needed for nausea or vomiting. 20 tablet 0   Oxycodone HCl 10 MG TABS Take 1 tablet (10 mg total) by mouth 2 (two) times daily as needed. 60 tablet 0   Oxycodone HCl 10 MG TABS Take 1 tablet (10 mg total) by mouth 2 (two) times daily as needed. 60 tablet 0   Oxycodone HCl 10 MG TABS Take 1 tablet (10 mg total) by mouth 2 (two) times daily as needed. 60 tablet 0   polyethylene glycol powder (GLYCOLAX/MIRALAX) 17 GM/SCOOP powder Dissolve 1 capful (17 g) in water and drink 2 (two) times daily. (Patient taking differently: Take 17 g by mouth daily.) 510 g 2   pregabalin (LYRICA) 150 MG capsule Take 1 capsule (150 mg total) by mouth 2 (two) times daily. 180 capsule 1   promethazine-dextromethorphan (PROMETHAZINE-DM) 6.25-15 MG/5ML syrup Take 5 mLs by mouth 4 (four) times daily as needed. 118 mL 0   senna-docusate (SENOKOT-S) 8.6-50 MG tablet Take 2 tablets by mouth at bedtime as needed for mild constipation. 60 tablet 1   sitaGLIPtin-metformin (JANUMET) 50-1000 MG tablet Take 1 tablet by mouth at bedtime. 90 tablet 1   No current facility-administered medications for this visit.   Facility-Administered Medications Ordered in Other Visits  Medication Dose  Route Frequency Provider Last Rate Last Admin   0.9 %  sodium chloride infusion (Manually program via Guardrails IV Fluids)  250 mL Intravenous Once Truitt Merle, MD        PHYSICAL EXAMINATION: ECOG PERFORMANCE STATUS:   Vitals:   08/15/22 0927 08/15/22 0929  BP: (!) 107/57 109/62  Pulse:    Resp:    Temp:    SpO2:     Wt Readings from Last 3 Encounters:  08/15/22 146 lb 9.6 oz (66.5 kg)  08/12/22 145 lb 12.8 oz (66.1 kg)  07/18/22 143 lb (64.9 kg)     GENERAL:alert, no distress and comfortable SKIN: skin color normal, no rashes or significant lesions EYES: normal, Conjunctiva are pink and non-injected, sclera clear  NEURO: alert & oriented x 3 with fluent speech  LABORATORY DATA:  I have reviewed the data as listed    Latest Ref Rng & Units 08/15/2022    8:44 AM 08/10/2022    2:02 PM 08/03/2022    2:03 PM  CBC  WBC 4.0 - 10.5 K/uL 3.4  4.5  7.2   Hemoglobin 12.0 - 15.0 g/dL 8.6  8.9  9.1   Hematocrit 36.0 - 46.0 % 26.5  27.3  27.8   Platelets 150 - 400 K/uL 77  64  68         Latest Ref Rng & Units 08/15/2022    8:44 AM 08/10/2022    2:02 PM 07/27/2022    2:05 PM  CMP  Glucose 70 - 99 mg/dL 181  262  182   BUN 8 - 23 mg/dL '12  11  12   '$ Creatinine 0.44 - 1.00 mg/dL 0.61  0.64  0.70   Sodium 135 - 145 mmol/L 141  135  137   Potassium 3.5 - 5.1 mmol/L 4.0  4.0  3.7   Chloride 98 - 111 mmol/L 108  106  107   CO2 22 - 32 mmol/L '28  24  25   '$ Calcium 8.9 - 10.3 mg/dL 8.3  8.1  8.0   Total Protein 6.5 - 8.1 g/dL 6.0   6.6   Total Bilirubin 0.3 - 1.2 mg/dL 1.3   1.4   Alkaline Phos 38 - 126 U/L 131   123   AST 15 - 41 U/L 19   28   ALT 0 - 44 U/L 17   31       RADIOGRAPHIC STUDIES: I have personally reviewed the radiological images as listed and agreed with the findings in the report. No results found.    Orders Placed This Encounter  Procedures   CBC with Differential ([facility] Only)    Standing Status:   Future    Standing Expiration Date:   0000000    Basic Metabolic Panel - [facility] Only    Standing Status:   Future    Standing Expiration Date:   10/10/2023   Ferritin    Standing Status:   Standing    Number of Occurrences:   20    Standing Expiration Date:   08/15/2023   Vitamin B12    Standing Status:   Standing    Number of Occurrences:   10    Standing Expiration Date:   08/15/2023   All questions were answered. The patient knows to call the clinic with any problems, questions or concerns. No barriers to learning was detected. The total time spent in the appointment was 25 minutes.     Truitt Merle, MD 08/15/2022   Felicity Coyer, CMA, am acting as scribe for Truitt Merle, MD.   I have reviewed the above documentation for accuracy and completeness, and I agree with the above.

## 2022-08-15 NOTE — Patient Instructions (Signed)
[facility]   Discharge Instructions: Thank you for choosing [facility] to provide your oncology and hematology care.   If you have a lab appointment with the [facility], please go directly to the [facility] and check in at the registration area.   Wear comfortable clothing and clothing appropriate for easy access to any Portacath or PICC line.   We strive to give you quality time with your provider. You may need to reschedule your appointment if you arrive late (15 or more minutes).  Arriving late affects you and other patients whose appointments are after yours.  Also, if you miss three or more appointments without notifying the office, you may be dismissed from the clinic at the provider's discretion.      For prescription refill requests, have your pharmacy contact our office and allow 72 hours for refills to be completed.    Today you received the following chemotherapy and/or immunotherapy agents: Azacitidine (Vidaza)       To help prevent nausea and vomiting after your treatment, we encourage you to take your nausea medication as directed.  BELOW ARE SYMPTOMS THAT SHOULD BE REPORTED IMMEDIATELY: *FEVER GREATER THAN 100.4 F (38 C) OR HIGHER *CHILLS OR SWEATING *NAUSEA AND VOMITING THAT IS NOT CONTROLLED WITH YOUR NAUSEA MEDICATION *UNUSUAL SHORTNESS OF BREATH *UNUSUAL BRUISING OR BLEEDING *URINARY PROBLEMS (pain or burning when urinating, or frequent urination) *BOWEL PROBLEMS (unusual diarrhea, constipation, pain near the anus) TENDERNESS IN MOUTH AND THROAT WITH OR WITHOUT PRESENCE OF ULCERS (sore throat, sores in mouth, or a toothache) UNUSUAL RASH, SWELLING OR PAIN  UNUSUAL VAGINAL DISCHARGE OR ITCHING   Items with * indicate a potential emergency and should be followed up as soon as possible or go to the Emergency Department if any problems should occur.  Please show the CHEMOTHERAPY ALERT CARD or IMMUNOTHERAPY ALERT  CARD at check-in to the Emergency Department and triage nurse.  Should you have questions after your visit or need to cancel or reschedule your appointment, please contact [facility]  Dept: [phone]  and follow the prompts.  Office hours are 8:00 a.m. to 4:30 p.m. Monday - Friday. Please note that voicemails left after 4:00 p.m. may not be returned until the following business day.  We are closed weekends and major holidays. You have access to a nurse at all times for urgent questions. Please call the main number to the clinic Dept: [phone] and follow the prompts.   For any non-urgent questions, you may also contact your provider using MyChart. We now offer e-Visits for anyone 63 and older to request care online for non-urgent symptoms. For details visit [email or web address].   Also download the MyChart app! Go to the app store, search "MyChart", open the app, select [facility], and log in with your MyChart username and password.

## 2022-08-15 NOTE — Progress Notes (Signed)
Per Dr. Burr Medico, [place] to proceed with treatment for platelets >40k.

## 2022-08-16 ENCOUNTER — Inpatient Hospital Stay: Payer: Medicare HMO

## 2022-08-16 VITALS — BP 100/56 | HR 74 | Temp 98.2°F | Resp 18

## 2022-08-16 DIAGNOSIS — C9310 Chronic myelomonocytic leukemia not having achieved remission: Secondary | ICD-10-CM

## 2022-08-16 DIAGNOSIS — I10 Essential (primary) hypertension: Secondary | ICD-10-CM | POA: Diagnosis not present

## 2022-08-16 DIAGNOSIS — Z5111 Encounter for antineoplastic chemotherapy: Secondary | ICD-10-CM | POA: Diagnosis not present

## 2022-08-16 DIAGNOSIS — D693 Immune thrombocytopenic purpura: Secondary | ICD-10-CM | POA: Diagnosis not present

## 2022-08-16 DIAGNOSIS — Z7984 Long term (current) use of oral hypoglycemic drugs: Secondary | ICD-10-CM | POA: Diagnosis not present

## 2022-08-16 DIAGNOSIS — Z79899 Other long term (current) drug therapy: Secondary | ICD-10-CM | POA: Diagnosis not present

## 2022-08-16 DIAGNOSIS — E785 Hyperlipidemia, unspecified: Secondary | ICD-10-CM | POA: Diagnosis not present

## 2022-08-16 DIAGNOSIS — E119 Type 2 diabetes mellitus without complications: Secondary | ICD-10-CM | POA: Diagnosis not present

## 2022-08-16 DIAGNOSIS — Z9884 Bariatric surgery status: Secondary | ICD-10-CM | POA: Diagnosis not present

## 2022-08-16 MED ORDER — SODIUM CHLORIDE 0.9 % IV SOLN
10.0000 mg | Freq: Once | INTRAVENOUS | Status: AC
  Administered 2022-08-16: 10 mg via INTRAVENOUS
  Filled 2022-08-16: qty 10

## 2022-08-16 MED ORDER — SODIUM CHLORIDE 0.9 % IV SOLN
75.0000 mg/m2 | Freq: Once | INTRAVENOUS | Status: AC
  Administered 2022-08-16: 130 mg via INTRAVENOUS
  Filled 2022-08-16: qty 13

## 2022-08-16 MED ORDER — SODIUM CHLORIDE 0.9 % IV SOLN: Freq: Once | INTRAVENOUS | Status: AC

## 2022-08-16 MED ORDER — SODIUM CHLORIDE 0.9% FLUSH
10.0000 mL | INTRAVENOUS | Status: DC | PRN
  Administered 2022-08-16: 10 mL

## 2022-08-16 MED ORDER — HEPARIN SOD (PORK) LOCK FLUSH 100 UNIT/ML IV SOLN
500.0000 [IU] | Freq: Once | INTRAVENOUS | Status: AC | PRN
  Administered 2022-08-16: 500 [IU]

## 2022-08-16 NOTE — Patient Instructions (Signed)
[facility]  Discharge Instructions: Thank you for choosing [facility] to provide your oncology and hematology care.   If you have a lab appointment with the Cancer Center, please go directly to the Cancer Center and check in at the registration area.   Wear comfortable clothing and clothing appropriate for easy access to any Portacath or PICC line.   We strive to give you quality time with your provider. You may need to reschedule your appointment if you arrive late (15 or more minutes).  Arriving late affects you and other patients whose appointments are after yours.  Also, if you miss three or more appointments without notifying the office, you may be dismissed from the clinic at the provider's discretion.      For prescription refill requests, have your pharmacy contact our office and allow 72 hours for refills to be completed.    Today you received the following chemotherapy and/or immunotherapy agents Vidaza      To help prevent nausea and vomiting after your treatment, we encourage you to take your nausea medication as directed.  BELOW ARE SYMPTOMS THAT SHOULD BE REPORTED IMMEDIATELY: *FEVER GREATER THAN 100.4 F (38 C) OR HIGHER *CHILLS OR SWEATING *NAUSEA AND VOMITING THAT IS NOT CONTROLLED WITH YOUR NAUSEA MEDICATION *UNUSUAL SHORTNESS OF BREATH *UNUSUAL BRUISING OR BLEEDING *URINARY PROBLEMS (pain or burning when urinating, or frequent urination) *BOWEL PROBLEMS (unusual diarrhea, constipation, pain near the anus) TENDERNESS IN MOUTH AND THROAT WITH OR WITHOUT PRESENCE OF ULCERS (sore throat, sores in mouth, or a toothache) UNUSUAL RASH, SWELLING OR PAIN  UNUSUAL VAGINAL DISCHARGE OR ITCHING   Items with * indicate a potential emergency and should be followed up as soon as possible or go to the Emergency Department if any problems should occur.  Please show the CHEMOTHERAPY ALERT CARD or IMMUNOTHERAPY ALERT CARD at check-in  to the Emergency Department and triage nurse.  Should you have questions after your visit or need to cancel or reschedule your appointment, please contact [facility]  Dept: [phone]  and follow the prompts.  Office hours are 8:00 a.m. to 4:30 p.m. Monday - Friday. Please note that voicemails left after 4:00 p.m. may not be returned until the following business day.  We are closed weekends and major holidays. You have access to a nurse at all times for urgent questions. Please call the main number to the clinic Dept: [phone] and follow the prompts.   For any non-urgent questions, you may also contact your provider using MyChart. We now offer e-Visits for anyone 18 and older to request care online for non-urgent symptoms. For details visit [email or web address].   Also download the MyChart app! Go to the app store, search "MyChart", open the app, select [facility], and log in with your MyChart username and password.   

## 2022-08-17 ENCOUNTER — Inpatient Hospital Stay: Payer: Medicare HMO

## 2022-08-17 ENCOUNTER — Telehealth: Payer: Self-pay

## 2022-08-17 VITALS — BP 117/61 | HR 83 | Temp 98.1°F | Resp 16

## 2022-08-17 DIAGNOSIS — E785 Hyperlipidemia, unspecified: Secondary | ICD-10-CM | POA: Diagnosis not present

## 2022-08-17 DIAGNOSIS — D696 Thrombocytopenia, unspecified: Secondary | ICD-10-CM

## 2022-08-17 DIAGNOSIS — E119 Type 2 diabetes mellitus without complications: Secondary | ICD-10-CM | POA: Diagnosis not present

## 2022-08-17 DIAGNOSIS — Z79899 Other long term (current) drug therapy: Secondary | ICD-10-CM | POA: Diagnosis not present

## 2022-08-17 DIAGNOSIS — Z9884 Bariatric surgery status: Secondary | ICD-10-CM | POA: Diagnosis not present

## 2022-08-17 DIAGNOSIS — I10 Essential (primary) hypertension: Secondary | ICD-10-CM | POA: Diagnosis not present

## 2022-08-17 DIAGNOSIS — C9310 Chronic myelomonocytic leukemia not having achieved remission: Secondary | ICD-10-CM | POA: Diagnosis not present

## 2022-08-17 DIAGNOSIS — D693 Immune thrombocytopenic purpura: Secondary | ICD-10-CM | POA: Diagnosis not present

## 2022-08-17 DIAGNOSIS — Z95828 Presence of other vascular implants and grafts: Secondary | ICD-10-CM

## 2022-08-17 DIAGNOSIS — Z5111 Encounter for antineoplastic chemotherapy: Secondary | ICD-10-CM | POA: Diagnosis not present

## 2022-08-17 DIAGNOSIS — Z7984 Long term (current) use of oral hypoglycemic drugs: Secondary | ICD-10-CM | POA: Diagnosis not present

## 2022-08-17 MED ORDER — SODIUM CHLORIDE 0.9 % IV SOLN: Freq: Once | INTRAVENOUS | Status: AC

## 2022-08-17 MED ORDER — HEPARIN SOD (PORK) LOCK FLUSH 100 UNIT/ML IV SOLN
500.0000 [IU] | Freq: Once | INTRAVENOUS | Status: AC | PRN
  Administered 2022-08-17: 500 [IU]

## 2022-08-17 MED ORDER — SODIUM CHLORIDE 0.9 % IV SOLN
10.0000 mg | Freq: Once | INTRAVENOUS | Status: DC
  Filled 2022-08-17: qty 1

## 2022-08-17 MED ORDER — ROMIPLOSTIM INJECTION 500 MCG
8.3000 ug/kg | Freq: Once | SUBCUTANEOUS | Status: AC
  Administered 2022-08-17: 550 ug via SUBCUTANEOUS
  Filled 2022-08-17: qty 1

## 2022-08-17 MED ORDER — SODIUM CHLORIDE 0.9 % IV SOLN
10.0000 mg | Freq: Once | INTRAVENOUS | Status: AC
  Administered 2022-08-17: 10 mg via INTRAVENOUS
  Filled 2022-08-17: qty 10

## 2022-08-17 MED ORDER — SODIUM CHLORIDE 0.9 % IV SOLN
75.0000 mg/m2 | Freq: Once | INTRAVENOUS | Status: AC
  Administered 2022-08-17: 130 mg via INTRAVENOUS
  Filled 2022-08-17: qty 13

## 2022-08-17 MED ORDER — SODIUM CHLORIDE 0.9% FLUSH
10.0000 mL | INTRAVENOUS | Status: DC | PRN
  Administered 2022-08-17: 10 mL

## 2022-08-17 MED FILL — Dexamethasone Sodium Phosphate Inj 100 MG/10ML: INTRAMUSCULAR | Qty: 1 | Status: AC

## 2022-08-17 NOTE — Telephone Encounter (Signed)
Faxed over last office note to [facility] Attn: Shon Millet PA-C as per Dr. Burr Medico

## 2022-08-18 ENCOUNTER — Inpatient Hospital Stay: Payer: Medicare HMO

## 2022-08-18 VITALS — BP 109/56 | HR 69 | Temp 98.5°F | Resp 16

## 2022-08-18 DIAGNOSIS — Z7984 Long term (current) use of oral hypoglycemic drugs: Secondary | ICD-10-CM | POA: Diagnosis not present

## 2022-08-18 DIAGNOSIS — E785 Hyperlipidemia, unspecified: Secondary | ICD-10-CM | POA: Diagnosis not present

## 2022-08-18 DIAGNOSIS — Z9884 Bariatric surgery status: Secondary | ICD-10-CM | POA: Diagnosis not present

## 2022-08-18 DIAGNOSIS — E119 Type 2 diabetes mellitus without complications: Secondary | ICD-10-CM | POA: Diagnosis not present

## 2022-08-18 DIAGNOSIS — C9310 Chronic myelomonocytic leukemia not having achieved remission: Secondary | ICD-10-CM

## 2022-08-18 DIAGNOSIS — Z5111 Encounter for antineoplastic chemotherapy: Secondary | ICD-10-CM | POA: Diagnosis not present

## 2022-08-18 DIAGNOSIS — I10 Essential (primary) hypertension: Secondary | ICD-10-CM | POA: Diagnosis not present

## 2022-08-18 DIAGNOSIS — Z79899 Other long term (current) drug therapy: Secondary | ICD-10-CM | POA: Diagnosis not present

## 2022-08-18 DIAGNOSIS — D693 Immune thrombocytopenic purpura: Secondary | ICD-10-CM | POA: Diagnosis not present

## 2022-08-18 MED ORDER — SODIUM CHLORIDE 0.9 % IV SOLN: Freq: Once | INTRAVENOUS | Status: AC

## 2022-08-18 MED ORDER — SODIUM CHLORIDE 0.9 % IV SOLN
10.0000 mg | Freq: Once | INTRAVENOUS | Status: AC
  Administered 2022-08-18: 10 mg via INTRAVENOUS
  Filled 2022-08-18: qty 10

## 2022-08-18 MED ORDER — SODIUM CHLORIDE 0.9 % IV SOLN
75.0000 mg/m2 | Freq: Once | INTRAVENOUS | Status: AC
  Administered 2022-08-18: 130 mg via INTRAVENOUS
  Filled 2022-08-18: qty 13

## 2022-08-18 MED ORDER — SODIUM CHLORIDE 0.9% FLUSH
10.0000 mL | INTRAVENOUS | Status: DC | PRN
  Administered 2022-08-18: 10 mL

## 2022-08-18 MED ORDER — HEPARIN SOD (PORK) LOCK FLUSH 100 UNIT/ML IV SOLN
500.0000 [IU] | Freq: Once | INTRAVENOUS | Status: AC | PRN
  Administered 2022-08-18: 500 [IU]

## 2022-08-18 MED FILL — Dexamethasone Sodium Phosphate Inj 100 MG/10ML: INTRAMUSCULAR | Qty: 1 | Status: AC

## 2022-08-18 NOTE — Patient Instructions (Signed)
[facility]  Discharge Instructions: Thank you for choosing [facility] to provide your oncology and hematology care.   If you have a lab appointment with the Cancer Center, please go directly to the Cancer Center and check in at the registration area.   Wear comfortable clothing and clothing appropriate for easy access to any Portacath or PICC line.   We strive to give you quality time with your provider. You may need to reschedule your appointment if you arrive late (15 or more minutes).  Arriving late affects you and other patients whose appointments are after yours.  Also, if you miss three or more appointments without notifying the office, you may be dismissed from the clinic at the provider's discretion.      For prescription refill requests, have your pharmacy contact our office and allow 72 hours for refills to be completed.    Today you received the following chemotherapy and/or immunotherapy agents Vidaza      To help prevent nausea and vomiting after your treatment, we encourage you to take your nausea medication as directed.  BELOW ARE SYMPTOMS THAT SHOULD BE REPORTED IMMEDIATELY: *FEVER GREATER THAN 100.4 F (38 C) OR HIGHER *CHILLS OR SWEATING *NAUSEA AND VOMITING THAT IS NOT CONTROLLED WITH YOUR NAUSEA MEDICATION *UNUSUAL SHORTNESS OF BREATH *UNUSUAL BRUISING OR BLEEDING *URINARY PROBLEMS (pain or burning when urinating, or frequent urination) *BOWEL PROBLEMS (unusual diarrhea, constipation, pain near the anus) TENDERNESS IN MOUTH AND THROAT WITH OR WITHOUT PRESENCE OF ULCERS (sore throat, sores in mouth, or a toothache) UNUSUAL RASH, SWELLING OR PAIN  UNUSUAL VAGINAL DISCHARGE OR ITCHING   Items with * indicate a potential emergency and should be followed up as soon as possible or go to the Emergency Department if any problems should occur.  Please show the CHEMOTHERAPY ALERT CARD or IMMUNOTHERAPY ALERT CARD at check-in  to the Emergency Department and triage nurse.  Should you have questions after your visit or need to cancel or reschedule your appointment, please contact [facility]  Dept: [phone]  and follow the prompts.  Office hours are 8:00 a.m. to 4:30 p.m. Monday - Friday. Please note that voicemails left after 4:00 p.m. may not be returned until the following business day.  We are closed weekends and major holidays. You have access to a nurse at all times for urgent questions. Please call the main number to the clinic Dept: [phone] and follow the prompts.   For any non-urgent questions, you may also contact your provider using MyChart. We now offer e-Visits for anyone 18 and older to request care online for non-urgent symptoms. For details visit [email or web address].   Also download the MyChart app! Go to the app store, search "MyChart", open the app, select [facility], and log in with your MyChart username and password.   

## 2022-08-19 ENCOUNTER — Inpatient Hospital Stay: Payer: Medicare HMO

## 2022-08-19 VITALS — BP 131/54 | HR 64 | Temp 98.3°F | Resp 18

## 2022-08-19 DIAGNOSIS — Z7984 Long term (current) use of oral hypoglycemic drugs: Secondary | ICD-10-CM | POA: Diagnosis not present

## 2022-08-19 DIAGNOSIS — Z79899 Other long term (current) drug therapy: Secondary | ICD-10-CM | POA: Diagnosis not present

## 2022-08-19 DIAGNOSIS — I10 Essential (primary) hypertension: Secondary | ICD-10-CM | POA: Diagnosis not present

## 2022-08-19 DIAGNOSIS — Z9884 Bariatric surgery status: Secondary | ICD-10-CM | POA: Diagnosis not present

## 2022-08-19 DIAGNOSIS — E119 Type 2 diabetes mellitus without complications: Secondary | ICD-10-CM | POA: Diagnosis not present

## 2022-08-19 DIAGNOSIS — D693 Immune thrombocytopenic purpura: Secondary | ICD-10-CM | POA: Diagnosis not present

## 2022-08-19 DIAGNOSIS — E785 Hyperlipidemia, unspecified: Secondary | ICD-10-CM | POA: Diagnosis not present

## 2022-08-19 DIAGNOSIS — C9310 Chronic myelomonocytic leukemia not having achieved remission: Secondary | ICD-10-CM | POA: Diagnosis not present

## 2022-08-19 DIAGNOSIS — Z5111 Encounter for antineoplastic chemotherapy: Secondary | ICD-10-CM | POA: Diagnosis not present

## 2022-08-19 MED ORDER — SODIUM CHLORIDE 0.9 % IV SOLN: Freq: Once | INTRAVENOUS | Status: AC

## 2022-08-19 MED ORDER — SODIUM CHLORIDE 0.9 % IV SOLN
75.0000 mg/m2 | Freq: Once | INTRAVENOUS | Status: DC
  Filled 2022-08-19: qty 13

## 2022-08-19 MED ORDER — SODIUM CHLORIDE 0.9 % IV SOLN
75.0000 mg/m2 | Freq: Once | INTRAVENOUS | Status: AC
  Administered 2022-08-19: 130 mg via INTRAVENOUS
  Filled 2022-08-19: qty 13

## 2022-08-19 MED ORDER — HEPARIN SOD (PORK) LOCK FLUSH 100 UNIT/ML IV SOLN
500.0000 [IU] | Freq: Once | INTRAVENOUS | Status: AC | PRN
  Administered 2022-08-19: 500 [IU]

## 2022-08-19 MED ORDER — SODIUM CHLORIDE 0.9% FLUSH
10.0000 mL | INTRAVENOUS | Status: DC | PRN
  Administered 2022-08-19: 10 mL

## 2022-08-19 MED ORDER — SODIUM CHLORIDE 0.9 % IV SOLN
10.0000 mg | Freq: Once | INTRAVENOUS | Status: AC
  Administered 2022-08-19: 10 mg via INTRAVENOUS
  Filled 2022-08-19: qty 10

## 2022-08-19 NOTE — Patient Instructions (Signed)
[facility]  Discharge Instructions: Thank you for choosing [facility] to provide your oncology and hematology care.   If you have a lab appointment with the Cancer Center, please go directly to the Cancer Center and check in at the registration area.   Wear comfortable clothing and clothing appropriate for easy access to any Portacath or PICC line.   We strive to give you quality time with your provider. You may need to reschedule your appointment if you arrive late (15 or more minutes).  Arriving late affects you and other patients whose appointments are after yours.  Also, if you miss three or more appointments without notifying the office, you may be dismissed from the clinic at the provider's discretion.      For prescription refill requests, have your pharmacy contact our office and allow 72 hours for refills to be completed.    Today you received the following chemotherapy and/or immunotherapy agents: Vidaza     To help prevent nausea and vomiting after your treatment, we encourage you to take your nausea medication as directed.  BELOW ARE SYMPTOMS THAT SHOULD BE REPORTED IMMEDIATELY: *FEVER GREATER THAN 100.4 F (38 C) OR HIGHER *CHILLS OR SWEATING *NAUSEA AND VOMITING THAT IS NOT CONTROLLED WITH YOUR NAUSEA MEDICATION *UNUSUAL SHORTNESS OF BREATH *UNUSUAL BRUISING OR BLEEDING *URINARY PROBLEMS (pain or burning when urinating, or frequent urination) *BOWEL PROBLEMS (unusual diarrhea, constipation, pain near the anus) TENDERNESS IN MOUTH AND THROAT WITH OR WITHOUT PRESENCE OF ULCERS (sore throat, sores in mouth, or a toothache) UNUSUAL RASH, SWELLING OR PAIN  UNUSUAL VAGINAL DISCHARGE OR ITCHING   Items with * indicate a potential emergency and should be followed up as soon as possible or go to the Emergency Department if any problems should occur.  Please show the CHEMOTHERAPY ALERT CARD or IMMUNOTHERAPY ALERT CARD at check-in  to the Emergency Department and triage nurse.  Should you have questions after your visit or need to cancel or reschedule your appointment, please contact [facility]  Dept: [phone]  and follow the prompts.  Office hours are 8:00 a.m. to 4:30 p.m. Monday - Friday. Please note that voicemails left after 4:00 p.m. may not be returned until the following business day.  We are closed weekends and major holidays. You have access to a nurse at all times for urgent questions. Please call the main number to the clinic Dept: [phone] and follow the prompts.   For any non-urgent questions, you may also contact your provider using MyChart. We now offer e-Visits for anyone 18 and older to request care online for non-urgent symptoms. For details visit [email or web address].   Also download the MyChart app! Go to the app store, search "MyChart", open the app, select [facility], and log in with your MyChart username and password.  

## 2022-08-24 ENCOUNTER — Inpatient Hospital Stay: Payer: Medicare HMO

## 2022-08-24 VITALS — BP 129/65 | HR 75 | Temp 98.2°F | Resp 18 | Ht 63.0 in | Wt 143.4 lb

## 2022-08-24 DIAGNOSIS — Z9884 Bariatric surgery status: Secondary | ICD-10-CM | POA: Diagnosis not present

## 2022-08-24 DIAGNOSIS — Z79899 Other long term (current) drug therapy: Secondary | ICD-10-CM | POA: Diagnosis not present

## 2022-08-24 DIAGNOSIS — Z5111 Encounter for antineoplastic chemotherapy: Secondary | ICD-10-CM | POA: Diagnosis not present

## 2022-08-24 DIAGNOSIS — D696 Thrombocytopenia, unspecified: Secondary | ICD-10-CM

## 2022-08-24 DIAGNOSIS — Z95828 Presence of other vascular implants and grafts: Secondary | ICD-10-CM

## 2022-08-24 DIAGNOSIS — I10 Essential (primary) hypertension: Secondary | ICD-10-CM | POA: Diagnosis not present

## 2022-08-24 DIAGNOSIS — E785 Hyperlipidemia, unspecified: Secondary | ICD-10-CM | POA: Diagnosis not present

## 2022-08-24 DIAGNOSIS — C9310 Chronic myelomonocytic leukemia not having achieved remission: Secondary | ICD-10-CM

## 2022-08-24 DIAGNOSIS — Z7984 Long term (current) use of oral hypoglycemic drugs: Secondary | ICD-10-CM | POA: Diagnosis not present

## 2022-08-24 DIAGNOSIS — E119 Type 2 diabetes mellitus without complications: Secondary | ICD-10-CM | POA: Diagnosis not present

## 2022-08-24 DIAGNOSIS — D693 Immune thrombocytopenic purpura: Secondary | ICD-10-CM | POA: Diagnosis not present

## 2022-08-24 DIAGNOSIS — E538 Deficiency of other specified B group vitamins: Secondary | ICD-10-CM

## 2022-08-24 LAB — CBC WITH DIFFERENTIAL (CANCER CENTER ONLY)
Abs Immature Granulocytes: 0.33 10*3/uL — ABNORMAL HIGH (ref 0.00–0.07)
Basophils Absolute: 0.1 10*3/uL (ref 0.0–0.1)
Basophils Relative: 1 %
Eosinophils Absolute: 0.1 10*3/uL (ref 0.0–0.5)
Eosinophils Relative: 1 %
HCT: 30 % — ABNORMAL LOW (ref 36.0–46.0)
Hemoglobin: 9.6 g/dL — ABNORMAL LOW (ref 12.0–15.0)
Immature Granulocytes: 5 %
Lymphocytes Relative: 10 %
Lymphs Abs: 0.7 10*3/uL (ref 0.7–4.0)
MCH: 26.8 pg (ref 26.0–34.0)
MCHC: 32 g/dL (ref 30.0–36.0)
MCV: 83.8 fL (ref 80.0–100.0)
Monocytes Absolute: 0.9 10*3/uL (ref 0.1–1.0)
Monocytes Relative: 14 %
Neutro Abs: 4.6 10*3/uL (ref 1.7–7.7)
Neutrophils Relative %: 69 %
Platelet Count: 56 10*3/uL — ABNORMAL LOW (ref 150–400)
RBC: 3.58 MIL/uL — ABNORMAL LOW (ref 3.87–5.11)
RDW: 21.2 % — ABNORMAL HIGH (ref 11.5–15.5)
WBC Count: 6.6 10*3/uL (ref 4.0–10.5)
nRBC: 0 % (ref 0.0–0.2)

## 2022-08-24 LAB — CBC AND DIFFERENTIAL
HCT: 31 — AB (ref 36–46)
Hemoglobin: 9.6 — AB (ref 12.0–16.0)
Neutrophils Absolute: 4.6
Platelets: 56 10*3/uL — AB (ref 150–400)
WBC: 6.6

## 2022-08-24 LAB — VITAMIN B12
Vitamin B-12: 2.4
Vitamin B-12: 2493 pg/mL — ABNORMAL HIGH (ref 180–914)

## 2022-08-24 LAB — SAMPLE TO BLOOD BANK

## 2022-08-24 LAB — FERRITIN: Ferritin: 25 ng/mL (ref 11–307)

## 2022-08-24 LAB — CBC: RBC: 3.58 — AB (ref 3.87–5.11)

## 2022-08-24 LAB — IRON,TIBC AND FERRITIN PANEL: Ferritin: 25

## 2022-08-24 MED ORDER — HEPARIN SOD (PORK) LOCK FLUSH 100 UNIT/ML IV SOLN
500.0000 [IU] | Freq: Once | INTRAVENOUS | Status: AC
  Administered 2022-08-24: 500 [IU] via INTRAVENOUS

## 2022-08-24 MED ORDER — SODIUM CHLORIDE 0.9 % IV SOLN
400.0000 mg | Freq: Once | INTRAVENOUS | Status: AC
  Administered 2022-08-24: 400 mg via INTRAVENOUS
  Filled 2022-08-24: qty 20

## 2022-08-24 MED ORDER — LORATADINE 10 MG PO TABS
10.0000 mg | ORAL_TABLET | Freq: Once | ORAL | Status: AC
  Administered 2022-08-24: 10 mg via ORAL
  Filled 2022-08-24: qty 1

## 2022-08-24 MED ORDER — SODIUM CHLORIDE 0.9% FLUSH
10.0000 mL | Freq: Once | INTRAVENOUS | Status: AC
  Administered 2022-08-24: 10 mL via INTRAVENOUS

## 2022-08-24 MED ORDER — ROMIPLOSTIM INJECTION 500 MCG
8.3000 ug/kg | Freq: Once | SUBCUTANEOUS | Status: AC
  Administered 2022-08-24: 540 ug via SUBCUTANEOUS
  Filled 2022-08-24: qty 1.08

## 2022-08-24 MED ORDER — SODIUM CHLORIDE 0.9 % IV SOLN: Freq: Once | INTRAVENOUS | Status: AC

## 2022-08-24 NOTE — Patient Instructions (Signed)
[facility]   Discharge Instructions: Thank you for choosing [facility] to provide your oncology and hematology care.   If you have a lab appointment with the [facility], please go directly to the [facility] and check in at the registration area.   Wear comfortable clothing and clothing appropriate for easy access to any Portacath or PICC line.   We strive to give you quality time with your provider. You may need to reschedule your appointment if you arrive late (15 or more minutes).  Arriving late affects you and other patients whose appointments are after yours.  Also, if you miss three or more appointments without notifying the office, you may be dismissed from the clinic at the provider's discretion.      For prescription refill requests, have your pharmacy contact our office and allow 72 hours for refills to be completed.    Today you received the following chemotherapy and/or immunotherapy agents Nplate      To help prevent nausea and vomiting after your treatment, we encourage you to take your nausea medication as directed.  BELOW ARE SYMPTOMS THAT SHOULD BE REPORTED IMMEDIATELY: *FEVER GREATER THAN 100.4 F (38 C) OR HIGHER *CHILLS OR SWEATING *NAUSEA AND VOMITING THAT IS NOT CONTROLLED WITH YOUR NAUSEA MEDICATION *UNUSUAL SHORTNESS OF BREATH *UNUSUAL BRUISING OR BLEEDING *URINARY PROBLEMS (pain or burning when urinating, or frequent urination) *BOWEL PROBLEMS (unusual diarrhea, constipation, pain near the anus) TENDERNESS IN MOUTH AND THROAT WITH OR WITHOUT PRESENCE OF ULCERS (sore throat, sores in mouth, or a toothache) UNUSUAL RASH, SWELLING OR PAIN  UNUSUAL VAGINAL DISCHARGE OR ITCHING   Items with * indicate a potential emergency and should be followed up as soon as possible or go to the Emergency Department if any problems should occur.  Please show the CHEMOTHERAPY ALERT CARD or IMMUNOTHERAPY ALERT CARD at check-in  to the Emergency Department and triage nurse.  Should you have questions after your visit or need to cancel or reschedule your appointment, please contact [facility]  Dept: [phone]  and follow the prompts.  Office hours are 8:00 a.m. to 4:30 p.m. Monday - Friday. Please note that voicemails left after 4:00 p.m. may not be returned until the following business day.  We are closed weekends and major holidays. You have access to a nurse at all times for urgent questions. Please call the main number to the clinic Dept: [phone] and follow the prompts.   For any non-urgent questions, you may also contact your provider using MyChart. We now offer e-Visits for anyone 23 and older to request care online for non-urgent symptoms. For details visit [email or web address].   Also download the MyChart app! Go to the app store, search "MyChart", open the app, select [facility], and log in with your MyChart username and password.  Romiplostim Injection What is this medication? ROMIPLOSTIM (roe mi PLOE stim) treats low levels of platelets in your body caused by immune thrombocytopenia (ITP). It is prescribed when other medications have not worked or cannot be tolerated. It may also be used to help people who have been exposed to high doses of radiation. It works by increasing the amount of platelets in your blood. This lowers the risk of bleeding. This medicine may be used for other purposes; ask your health care provider or pharmacist if you have questions. COMMON BRAND NAME(S): Nplate What should I tell my care team before I take this medication? They  need to know if you have any of these conditions: Blood clots Myelodysplastic syndrome An unusual or allergic reaction to romiplostim, mannitol, other medications, foods, dyes, or preservatives Pregnant or trying to get pregnant Breast-feeding How should I use this medication? This medication is injected under the  skin. It is given by a care team in a hospital or clinic setting. A special MedGuide will be given to you before each treatment. Be sure to read this information carefully each time. Talk to your care team about the use of this medication in children. While it may be prescribed for children as young as newborns for selected conditions, precautions do apply. Overdosage: If you think you have taken too much of this medicine contact a poison control center or emergency room at once. NOTE: This medicine is only for you. Do not share this medicine with others. What if I miss a dose? Keep appointments for follow-up doses. It is important not to miss your dose. Call your care team if you are unable to keep an appointment. What may interact with this medication? Interactions are not expected. This list may not describe all possible interactions. Give your health care provider a list of all the medicines, herbs, non-prescription drugs, or dietary supplements you use. Also tell them if you smoke, drink alcohol, or use illegal drugs. Some items may interact with your medicine. What should I watch for while using this medication? Visit your care team for regular checks on your progress. You may need blood work done while you are taking this medication. Your condition will be monitored carefully while you are receiving this medication. It is important not to miss any appointments. What side effects may I notice from receiving this medication? Side effects that you should report to your care team as soon as possible: Allergic reactions--skin rash, itching, hives, swelling of the face, lips, tongue, or throat Blood clot--pain, swelling, or warmth in the leg, shortness of breath, chest pain Side effects that usually do not require medical attention (report to your care team if they continue or are bothersome): Dizziness Joint pain Muscle pain Pain in the hands or feet Stomach pain Trouble sleeping This list  may not describe all possible side effects. Call your doctor for medical advice about side effects. You may report side effects to FDA at 1-800-FDA-1088. Where should I keep my medication? This medication is given in a hospital or clinic. It will not be stored at home. NOTE: This sheet is a summary. It may not cover all possible information. If you have questions about this medicine, talk to your doctor, pharmacist, or health care provider.  2023 Elsevier/Gold Standard (2021-09-07 00:00:00)  Iron Sucrose Injection What is this medication? IRON SUCROSE (EYE ern SOO krose) treats low levels of iron (iron deficiency anemia) in people with kidney disease. Iron is a mineral that plays an important role in making red blood cells, which carry oxygen from your lungs to the rest of your body. This medicine may be used for other purposes; ask your health care provider or pharmacist if you have questions. COMMON BRAND NAME(S): Venofer What should I tell my care team before I take this medication? They need to know if you have any of these conditions: Anemia not caused by low iron levels Heart disease High levels of iron in the blood Kidney disease Liver disease An unusual or allergic reaction to iron, other medications, foods, dyes, or preservatives Pregnant or trying to get pregnant Breastfeeding How should I use this  medication? This medication is for infusion into a vein. It is given in a hospital or clinic setting. Talk to your care team about the use of this medication in children. While this medication may be prescribed for children as young as 2 years for selected conditions, precautions do apply. Overdosage: If you think you have taken too much of this medicine contact a poison control center or emergency room at once. NOTE: This medicine is only for you. Do not share this medicine with others. What if I miss a dose? Keep appointments for follow-up doses. It is important not to miss your  dose. Call your care team if you are unable to keep an appointment. What may interact with this medication? Do not take this medication with any of the following: Deferoxamine Dimercaprol Other iron products This medication may also interact with the following: Chloramphenicol Deferasirox This list may not describe all possible interactions. Give your health care provider a list of all the medicines, herbs, non-prescription drugs, or dietary supplements you use. Also tell them if you smoke, drink alcohol, or use illegal drugs. Some items may interact with your medicine. What should I watch for while using this medication? Visit your care team regularly. Tell your care team if your symptoms do not start to get better or if they get worse. You may need blood work done while you are taking this medication. You may need to follow a special diet. Talk to your care team. Foods that contain iron include: whole grains/cereals, dried fruits, beans, or peas, leafy green vegetables, and organ meats (liver, kidney). What side effects may I notice from receiving this medication? Side effects that you should report to your care team as soon as possible: Allergic reactions--skin rash, itching, hives, swelling of the face, lips, tongue, or throat Low blood pressure--dizziness, feeling faint or lightheaded, blurry vision Shortness of breath Side effects that usually do not require medical attention (report to your care team if they continue or are bothersome): Flushing Headache Joint pain Muscle pain Nausea Pain, redness, or irritation at injection site This list may not describe all possible side effects. Call your doctor for medical advice about side effects. You may report side effects to FDA at 1-800-FDA-1088. Where should I keep my medication? This medication is given in a hospital or clinic and will not be stored at home. NOTE: This sheet is a summary. It may not cover all possible information. If  you have questions about this medicine, talk to your doctor, pharmacist, or health care provider.  2023 Elsevier/Gold Standard (2020-09-10 00:00:00)

## 2022-08-26 DIAGNOSIS — H524 Presbyopia: Secondary | ICD-10-CM | POA: Diagnosis not present

## 2022-08-26 DIAGNOSIS — H52223 Regular astigmatism, bilateral: Secondary | ICD-10-CM | POA: Diagnosis not present

## 2022-08-29 ENCOUNTER — Telehealth: Payer: Self-pay

## 2022-08-29 ENCOUNTER — Other Ambulatory Visit: Payer: Self-pay

## 2022-08-29 DIAGNOSIS — E538 Deficiency of other specified B group vitamins: Secondary | ICD-10-CM

## 2022-08-29 NOTE — Telephone Encounter (Signed)
Pt returned Johna Roles, [place] call regarding pt's B12 labs.  Informed pt that Dr. Burr Medico reviewed her recent B12 labs and wanted to know how often is the pt getting B12 injections from her PCP.  Pt is also getting B12 from the [facility].  Pt stated that her daughter told Dr. Burr Medico incorrect information regarding her B12 injections.  Pt stated she DOES NOT get B12 injections from her PCP.  Pt stated she gets all her injections from the [facility].  Informed pt that Cira Rue, NP and Dr. Burr Medico would like to hold her B12 injections but continue to monitor B12 labs.  Informed pt that she will be a B12 injections if her B12 labs are below normal limits.  Pt verbalized understanding and had no further questions at this time.

## 2022-08-29 NOTE — Telephone Encounter (Addendum)
Called patients daughter to relay message below. Told her we would sent the lab results to her PCP for review. Daughter voiced full understanding.    ----- Message from Truitt Merle, MD sent at 08/28/2022  9:34 PM EDT ----- Please let pt know her B12 level has been high, please confirm if she gets B12 injection at her PCP and send lab results to her PCP, she probably can reduce her B12 injection frequency.  Truitt Merle

## 2022-08-31 ENCOUNTER — Inpatient Hospital Stay: Payer: Medicare HMO

## 2022-08-31 VITALS — BP 121/64 | HR 69 | Temp 98.1°F | Resp 18

## 2022-08-31 DIAGNOSIS — C9310 Chronic myelomonocytic leukemia not having achieved remission: Secondary | ICD-10-CM

## 2022-08-31 DIAGNOSIS — D693 Immune thrombocytopenic purpura: Secondary | ICD-10-CM | POA: Diagnosis not present

## 2022-08-31 DIAGNOSIS — I10 Essential (primary) hypertension: Secondary | ICD-10-CM | POA: Diagnosis not present

## 2022-08-31 DIAGNOSIS — E119 Type 2 diabetes mellitus without complications: Secondary | ICD-10-CM | POA: Diagnosis not present

## 2022-08-31 DIAGNOSIS — Z95828 Presence of other vascular implants and grafts: Secondary | ICD-10-CM

## 2022-08-31 DIAGNOSIS — Z79899 Other long term (current) drug therapy: Secondary | ICD-10-CM | POA: Diagnosis not present

## 2022-08-31 DIAGNOSIS — Z9884 Bariatric surgery status: Secondary | ICD-10-CM | POA: Diagnosis not present

## 2022-08-31 DIAGNOSIS — E538 Deficiency of other specified B group vitamins: Secondary | ICD-10-CM

## 2022-08-31 DIAGNOSIS — Z5111 Encounter for antineoplastic chemotherapy: Secondary | ICD-10-CM | POA: Diagnosis not present

## 2022-08-31 DIAGNOSIS — Z7984 Long term (current) use of oral hypoglycemic drugs: Secondary | ICD-10-CM | POA: Diagnosis not present

## 2022-08-31 DIAGNOSIS — E785 Hyperlipidemia, unspecified: Secondary | ICD-10-CM | POA: Diagnosis not present

## 2022-08-31 DIAGNOSIS — D696 Thrombocytopenia, unspecified: Secondary | ICD-10-CM

## 2022-08-31 LAB — CBC WITH DIFFERENTIAL (CANCER CENTER ONLY)
Abs Immature Granulocytes: 0.24 10*3/uL — ABNORMAL HIGH (ref 0.00–0.07)
Basophils Absolute: 0 10*3/uL (ref 0.0–0.1)
Basophils Relative: 1 %
Eosinophils Absolute: 0.1 10*3/uL (ref 0.0–0.5)
Eosinophils Relative: 1 %
HCT: 28.2 % — ABNORMAL LOW (ref 36.0–46.0)
Hemoglobin: 8.9 g/dL — ABNORMAL LOW (ref 12.0–15.0)
Immature Granulocytes: 3 %
Lymphocytes Relative: 8 %
Lymphs Abs: 0.6 10*3/uL — ABNORMAL LOW (ref 0.7–4.0)
MCH: 27 pg (ref 26.0–34.0)
MCHC: 31.6 g/dL (ref 30.0–36.0)
MCV: 85.5 fL (ref 80.0–100.0)
Monocytes Absolute: 1.6 10*3/uL — ABNORMAL HIGH (ref 0.1–1.0)
Monocytes Relative: 21 %
Neutro Abs: 5.2 10*3/uL (ref 1.7–7.7)
Neutrophils Relative %: 66 %
Platelet Count: 57 10*3/uL — ABNORMAL LOW (ref 150–400)
RBC: 3.3 MIL/uL — ABNORMAL LOW (ref 3.87–5.11)
RDW: 22.5 % — ABNORMAL HIGH (ref 11.5–15.5)
WBC Count: 7.8 10*3/uL (ref 4.0–10.5)
nRBC: 0 % (ref 0.0–0.2)

## 2022-08-31 LAB — COMPREHENSIVE METABOLIC PANEL
ALT: 18 U/L (ref 0–44)
AST: 18 U/L (ref 15–41)
Albumin: 3.3 g/dL — ABNORMAL LOW (ref 3.5–5.0)
Alkaline Phosphatase: 130 U/L — ABNORMAL HIGH (ref 38–126)
Anion gap: 5 (ref 5–15)
BUN: 12 mg/dL (ref 8–23)
CO2: 27 mmol/L (ref 22–32)
Calcium: 8.5 mg/dL — ABNORMAL LOW (ref 8.9–10.3)
Chloride: 109 mmol/L (ref 98–111)
Creatinine, Ser: 0.58 mg/dL (ref 0.44–1.00)
GFR, Estimated: >60 mL/min (ref >60)
Glucose, Bld: 189 mg/dL — ABNORMAL HIGH (ref 70–99)
Potassium: 4.1 mmol/L (ref 3.5–5.1)
Sodium: 141 mmol/L (ref 135–145)
Total Bilirubin: 1.1 mg/dL (ref 0.3–1.2)
Total Protein: 6.2 g/dL — ABNORMAL LOW (ref 6.5–8.1)

## 2022-08-31 LAB — SAMPLE TO BLOOD BANK

## 2022-08-31 LAB — VITAMIN B12: Vitamin B-12: 3286 pg/mL — ABNORMAL HIGH (ref 180–914)

## 2022-08-31 MED ORDER — SODIUM CHLORIDE 0.9 % IV SOLN
400.0000 mg | Freq: Once | INTRAVENOUS | Status: AC
  Administered 2022-08-31: 400 mg via INTRAVENOUS
  Filled 2022-08-31: qty 20

## 2022-08-31 MED ORDER — HEPARIN SOD (PORK) LOCK FLUSH 100 UNIT/ML IV SOLN
500.0000 [IU] | Freq: Once | INTRAVENOUS | Status: AC | PRN
  Administered 2022-08-31: 500 [IU]

## 2022-08-31 MED ORDER — SODIUM CHLORIDE 0.9 % IV SOLN: Freq: Once | INTRAVENOUS | Status: AC

## 2022-08-31 MED ORDER — SODIUM CHLORIDE 0.9% FLUSH
10.0000 mL | Freq: Once | INTRAVENOUS | Status: AC | PRN
  Administered 2022-08-31: 10 mL

## 2022-08-31 MED ORDER — LORATADINE 10 MG PO TABS
10.0000 mg | ORAL_TABLET | Freq: Once | ORAL | Status: AC
  Administered 2022-08-31: 10 mg via ORAL
  Filled 2022-08-31: qty 1

## 2022-08-31 MED ORDER — ROMIPLOSTIM INJECTION 500 MCG
550.0000 ug | Freq: Once | SUBCUTANEOUS | Status: AC
  Administered 2022-08-31: 550 ug via SUBCUTANEOUS
  Filled 2022-08-31: qty 1.1

## 2022-08-31 NOTE — Patient Instructions (Signed)

## 2022-08-31 NOTE — Patient Instructions (Signed)
Iron Sucrose Injection What is this medication? IRON SUCROSE (EYE ern SOO krose) treats low levels of iron (iron deficiency anemia) in people with kidney disease. Iron is a mineral that plays an important role in making red blood cells, which carry oxygen from your lungs to the rest of your body. This medicine may be used for other purposes; ask your health care provider or pharmacist if you have questions. COMMON BRAND NAME(S): Venofer What should I tell my care team before I take this medication? They need to know if you have any of these conditions: Anemia not caused by low iron levels Heart disease High levels of iron in the blood Kidney disease Liver disease An unusual or allergic reaction to iron, other medications, foods, dyes, or preservatives Pregnant or trying to get pregnant Breastfeeding How should I use this medication? This medication is for infusion into a vein. It is given in a hospital or clinic setting. Talk to your care team about the use of this medication in children. While this medication may be prescribed for children as young as 2 years for selected conditions, precautions do apply. Overdosage: If you think you have taken too much of this medicine contact a poison control center or emergency room at once. NOTE: This medicine is only for you. Do not share this medicine with others. What if I miss a dose? Keep appointments for follow-up doses. It is important not to miss your dose. Call your care team if you are unable to keep an appointment. What may interact with this medication? Do not take this medication with any of the following: Deferoxamine Dimercaprol Other iron products This medication may also interact with the following: Chloramphenicol Deferasirox This list may not describe all possible interactions. Give your health care provider a list of all the medicines, herbs, non-prescription drugs, or dietary supplements you use. Also tell them if you smoke,  drink alcohol, or use illegal drugs. Some items may interact with your medicine. What should I watch for while using this medication? Visit your care team regularly. Tell your care team if your symptoms do not start to get better or if they get worse. You may need blood work done while you are taking this medication. You may need to follow a special diet. Talk to your care team. Foods that contain iron include: whole grains/cereals, dried fruits, beans, or peas, leafy green vegetables, and organ meats (liver, kidney). What side effects may I notice from receiving this medication? Side effects that you should report to your care team as soon as possible: Allergic reactions--skin rash, itching, hives, swelling of the face, lips, tongue, or throat Low blood pressure--dizziness, feeling faint or lightheaded, blurry vision Shortness of breath Side effects that usually do not require medical attention (report to your care team if they continue or are bothersome): Flushing Headache Joint pain Muscle pain Nausea Pain, redness, or irritation at injection site This list may not describe all possible side effects. Call your doctor for medical advice about side effects. You may report side effects to FDA at 1-800-FDA-1088. Where should I keep my medication? This medication is given in a hospital or clinic and will not be stored at home. NOTE: This sheet is a summary. It may not cover all possible information. If you have questions about this medicine, talk to your doctor, pharmacist, or health care provider.  2023 Elsevier/Gold Standard (2020-09-10 00:00:00)  Romiplostim Injection What is this medication? ROMIPLOSTIM (roe mi PLOE stim) treats low levels of platelets in  your body caused by immune thrombocytopenia (ITP). It is prescribed when other medications have not worked or cannot be tolerated. It may also be used to help people who have been exposed to high doses of radiation. It works by increasing  the amount of platelets in your blood. This lowers the risk of bleeding. This medicine may be used for other purposes; ask your health care provider or pharmacist if you have questions. COMMON BRAND NAME(S): Nplate What should I tell my care team before I take this medication? They need to know if you have any of these conditions: Blood clots Myelodysplastic syndrome An unusual or allergic reaction to romiplostim, mannitol, other medications, foods, dyes, or preservatives Pregnant or trying to get pregnant Breast-feeding How should I use this medication? This medication is injected under the skin. It is given by a care team in a hospital or clinic setting. A special MedGuide will be given to you before each treatment. Be sure to read this information carefully each time. Talk to your care team about the use of this medication in children. While it may be prescribed for children as young as newborns for selected conditions, precautions do apply. Overdosage: If you think you have taken too much of this medicine contact a poison control center or emergency room at once. NOTE: This medicine is only for you. Do not share this medicine with others. What if I miss a dose? Keep appointments for follow-up doses. It is important not to miss your dose. Call your care team if you are unable to keep an appointment. What may interact with this medication? Interactions are not expected. This list may not describe all possible interactions. Give your health care provider a list of all the medicines, herbs, non-prescription drugs, or dietary supplements you use. Also tell them if you smoke, drink alcohol, or use illegal drugs. Some items may interact with your medicine. What should I watch for while using this medication? Visit your care team for regular checks on your progress. You may need blood work done while you are taking this medication. Your condition will be monitored carefully while you are receiving  this medication. It is important not to miss any appointments. What side effects may I notice from receiving this medication? Side effects that you should report to your care team as soon as possible: Allergic reactions--skin rash, itching, hives, swelling of the face, lips, tongue, or throat Blood clot--pain, swelling, or warmth in the leg, shortness of breath, chest pain Side effects that usually do not require medical attention (report to your care team if they continue or are bothersome): Dizziness Joint pain Muscle pain Pain in the hands or feet Stomach pain Trouble sleeping This list may not describe all possible side effects. Call your doctor for medical advice about side effects. You may report side effects to FDA at 1-800-FDA-1088. Where should I keep my medication? This medication is given in a hospital or clinic. It will not be stored at home. NOTE: This sheet is a summary. It may not cover all possible information. If you have questions about this medicine, talk to your doctor, pharmacist, or health care provider.  2023 Elsevier/Gold Standard (2021-09-07 00:00:00)

## 2022-09-07 ENCOUNTER — Telehealth: Payer: Self-pay | Admitting: Hematology

## 2022-09-07 ENCOUNTER — Other Ambulatory Visit: Payer: Self-pay

## 2022-09-07 ENCOUNTER — Inpatient Hospital Stay: Payer: Medicare HMO

## 2022-09-07 VITALS — BP 98/49 | HR 72 | Temp 98.2°F | Resp 18

## 2022-09-07 DIAGNOSIS — Z95828 Presence of other vascular implants and grafts: Secondary | ICD-10-CM

## 2022-09-07 DIAGNOSIS — Z5111 Encounter for antineoplastic chemotherapy: Secondary | ICD-10-CM | POA: Diagnosis not present

## 2022-09-07 DIAGNOSIS — E785 Hyperlipidemia, unspecified: Secondary | ICD-10-CM | POA: Diagnosis not present

## 2022-09-07 DIAGNOSIS — Z79899 Other long term (current) drug therapy: Secondary | ICD-10-CM | POA: Diagnosis not present

## 2022-09-07 DIAGNOSIS — D693 Immune thrombocytopenic purpura: Secondary | ICD-10-CM | POA: Diagnosis not present

## 2022-09-07 DIAGNOSIS — E119 Type 2 diabetes mellitus without complications: Secondary | ICD-10-CM | POA: Diagnosis not present

## 2022-09-07 DIAGNOSIS — I10 Essential (primary) hypertension: Secondary | ICD-10-CM | POA: Diagnosis not present

## 2022-09-07 DIAGNOSIS — C9310 Chronic myelomonocytic leukemia not having achieved remission: Secondary | ICD-10-CM

## 2022-09-07 DIAGNOSIS — Z7984 Long term (current) use of oral hypoglycemic drugs: Secondary | ICD-10-CM | POA: Diagnosis not present

## 2022-09-07 DIAGNOSIS — D696 Thrombocytopenia, unspecified: Secondary | ICD-10-CM

## 2022-09-07 DIAGNOSIS — Z9884 Bariatric surgery status: Secondary | ICD-10-CM | POA: Diagnosis not present

## 2022-09-07 LAB — CBC WITH DIFFERENTIAL (CANCER CENTER ONLY)
Abs Immature Granulocytes: 0.03 10*3/uL (ref 0.00–0.07)
Basophils Absolute: 0 10*3/uL (ref 0.0–0.1)
Basophils Relative: 1 %
Eosinophils Absolute: 0 10*3/uL (ref 0.0–0.5)
Eosinophils Relative: 0 %
HCT: 27.8 % — ABNORMAL LOW (ref 36.0–46.0)
Hemoglobin: 9 g/dL — ABNORMAL LOW (ref 12.0–15.0)
Immature Granulocytes: 1 %
Lymphocytes Relative: 11 %
Lymphs Abs: 0.5 10*3/uL — ABNORMAL LOW (ref 0.7–4.0)
MCH: 28.1 pg (ref 26.0–34.0)
MCHC: 32.4 g/dL (ref 30.0–36.0)
MCV: 86.9 fL (ref 80.0–100.0)
Monocytes Absolute: 1 10*3/uL (ref 0.1–1.0)
Monocytes Relative: 20 %
Neutro Abs: 3.2 10*3/uL (ref 1.7–7.7)
Neutrophils Relative %: 67 %
Platelet Count: 76 10*3/uL — ABNORMAL LOW (ref 150–400)
RBC: 3.2 MIL/uL — ABNORMAL LOW (ref 3.87–5.11)
RDW: 23.4 % — ABNORMAL HIGH (ref 11.5–15.5)
WBC Count: 4.7 10*3/uL (ref 4.0–10.5)
nRBC: 0 % (ref 0.0–0.2)

## 2022-09-07 LAB — SAMPLE TO BLOOD BANK

## 2022-09-07 MED ORDER — SODIUM CHLORIDE 0.9% FLUSH
10.0000 mL | Freq: Once | INTRAVENOUS | Status: AC
  Administered 2022-09-07: 10 mL

## 2022-09-07 MED ORDER — HEPARIN SOD (PORK) LOCK FLUSH 100 UNIT/ML IV SOLN
500.0000 [IU] | Freq: Once | INTRAVENOUS | Status: AC
  Administered 2022-09-07: 500 [IU]

## 2022-09-07 MED ORDER — ROMIPLOSTIM INJECTION 500 MCG
550.0000 ug | Freq: Once | SUBCUTANEOUS | Status: AC
  Administered 2022-09-07: 550 ug via SUBCUTANEOUS
  Filled 2022-09-07: qty 1

## 2022-09-09 MED FILL — Dexamethasone Sodium Phosphate Inj 100 MG/10ML: INTRAMUSCULAR | Qty: 1 | Status: AC

## 2022-09-11 NOTE — Progress Notes (Unsigned)
Patient Care Team: [name], DO as PCP - General (Family Medicine) Loletha Carrow Kirke Corin, MD as Consulting Physician (Gastroenterology) Truitt Merle, MD as Consulting Physician (Hematology) Alda Berthold, DO as Consulting Physician (Neurology)   CHIEF COMPLAINT: Follow up [name] and severe thrombocytopenia  Oncology History  [name] (chronic myelomonocytic leukemia) ([place])  10/29/2020 Imaging   CT CAP  IMPRESSION: 1. Splenomegaly with pathologically enlarged lymph nodes above and below the diaphragm, with overall stable to minimally increased abdominal adenopathy and interval progression of the pelvic adenopathy. 2. Small volume abdominopelvic ascites with diffuse mesenteric stranding. 3. Scattered bilateral pulmonary micro nodules measuring 1-2 mm. 4. Distended gallbladder with some layering hyperdense material representing layering sludge and tiny stones seen on prior ultrasound. 5. Aortic atherosclerosis.   10/30/2020 Pathology Results   DIAGNOSIS:   BONE MARROW, ASPIRATE, CLOT, CORE:  -  Hypercellular bone marrow with panhyperplasia, atypia, and no  increase in blasts  -  See comment   PERIPHERAL BLOOD:  -  Marked thrombocytopenia  -  Absolute monocytosis  -  Normocytic anemia  -  See CBC data and comment   COMMENT:  The bone marrow is hypercellular for age (approximately 80%) with myeloid hyperplasia with maturational left shift, erythroid hyperplasia, and increased megakaryocytes.  Mild multilineage atypia is present. Blasts are not increased on aspirate smears (1% by manual differential count) or by CD34 immunohistochemistry on the core biopsy.  Concurrent flow cytometric analysis of the bone marrow aspirate demonstrates increased monocytes, and no increase in blasts or abnormal lymphoid population (see [id number]).  Monocytes are also increased in peripheral  blood, persistent per electronic medical record.  In aggregate, the  findings raise the possibility of a  myeloid neoplasm with the  differential diagnosis including a low-grade myelodysplastic syndrome and chronic myelomonocytic leukemia (dysplastic type).    ADDENDUM:  A reticulin special stain performed on the bone marrow core biopsy reveals no significant increase in reticulin fibrosis.  ADDENDUM:  CYTOGENETIC RESULTS:  Karyotype: 46,XX[20]  Interpretation: NORMAL FEMALE KARYOTYPE   FISH RESULTS:  Results: NORMAL   ADDENDUM:  CD123 immunohistochemistry performed on the core biopsy highlights scattered aggregates of positively staining cells consistent with plasmacytoid dendritic cells.    10/30/2020 Pathology Results   DIAGNOSIS:   BONE MARROW; FLOW CYTOMETRIC ANALYSIS:  -  Increased monocytes  -  Scant B-cells present  -  No immunophenotypically aberrant T-cell population identified  -  No increase in blasts  -  See comment   COMMENT:  Monocytes are relatively increased (12% of all cells), without aberrant expression of CD56.  B-cells comprise <1% of total lymphocytes. CD34-positive blasts are not increased (<1% of all cells).  Correlation with concurrent morphology is recommended for complete diagnostic interpretation and overall blast enumeration (see [id number]).    10/30/2020 Pathology Results   FINAL MICROSCOPIC DIAGNOSIS:   A. LYMPH NODE, RIGHT AXILLARY, NEEDLE CORE BIOPSY:  -Lymphoid tissue present  -See comment   COMMENT:  The sections show several small needle core biopsy fragments of lymphoid tissue displaying degenerative cellular changes/necrosis and hence cannot be accurately evaluated.  Sample for flow cytometric analysis not available.  Immunohistochemical stain for CD3 and CD20 were performed with appropriate controls.  There is a mixture of T and B cells in their apparently respective compartments.  There is no definite metastatic malignancy.    11/11/2020 Pathology Results   DIAGNOSIS:   LEFT AXILLARY LYMPH NODE EXCISIONAL BIOPSY; FLOW CYTOMETRIC ANALYSIS:   -  No monotypic  B-cell or immunophenotypically aberrant T-cell  population identified  -  See comment   COMMENT:  Flow cytometric analysis identifies B-cells with a normal kappa:lambda ratio of 1.7:1.  A subset of the polytypic B-cells expresses CD10. T-cells show a CD4:CD8 ratio of 3.3:1 without immunophenotypic aberrancy with the markers evaluated.  Although these results do not support the diagnosis of a clonal lymphoid population, sampling issues must always be considered when negative results are obtained, as focal lesions may not be represented in the specimen submitted.  In addition, flow cytometric immunophenotyping will not exclude other pathology if present (e.g. Hodgkin lymphoma, some T-cell lymphomas, metastatic and infectious diseases).   11/11/2020 Pathology Results   FINAL MICROSCOPIC DIAGNOSIS:   A. LYMPH NODE, LEFT AXILLARY, DISSECTION:  -  Follicular hyperplasia with interfollicular expansion  -  See comment   COMMENT:  Sections of the lymph nodes reveal generally preserved lymph node architecture.  There is follicular hyperplasia with some follicles showing increased hyalinization of germinal centers and mild concentric encircling of germinal centers with small lymphocytes (onion skinning). Interfollicular areas are expanded with increased vascular proliferation, small lymphocytes, plasma cells, histiocytic cells, and patchy neutrophils.  The lymph node capsule is variably thickened by fibrosis with few plasma cells.   A panel of immunohistochemical stains is performed for further  characterization.  CD3 and CD20/PAX5 highlight T-cell and B-cell  compartments, respectively.  Germinal centers are highlighted by CD10 and BCL6 with appropriate absent expression of BCL2.  CD5 is similar to CD3.  CD43 also stains the T-cells.  CD4-positive T-cells exceed CD8-positive T-cells.  CD30 highlights scattered immunoblasts.  CD21 reveals intact follicular dendritic cell meshworks.  CD68  highlights increased histiocytic cells.  HHV 8 is negative.  T. pallidum reveals no definitive organisms.  Pancytokeratin is negative.  TdT stains rare cells.  CD138 highlights plasma cells which are not increased in number and show polytypic light chain expression by kappa/lambda in situ  hybridization.  EBV is negative by in situ hybridization.   Concurrent flow cytometric analysis is negative for a monoclonal B-cell or immunophenotypically aberrant T-cell population (see [phone]).   Together, the findings above are non-specific and can be seen in  reactive and infectious processes as well as [place]man disease.  There is no definitive morphologic or flow cytometric evidence of involvement by a lymphoproliferative disorder from the current workup.   12/17/2020 Initial Diagnosis   [name] (chronic myelomonocytic leukemia) ([place])   12/28/2020 - 02/03/2022 Chemotherapy   Patient is on Treatment Plan : MYELODYSPLASIA  Azacitidine IV D1-5 q28d     03/18/2021 Pathology Results   Final Diagnosis    BONE MARROW:             Persistent chronic myelomonocytic leukemia in a hypercellular bone marrow with increased monocytes and no increase in blasts.   Comment    Flow cytometric analysis showed no increase in blasts and 17% monocytes.A next generation myeloid panel showed the presence of the following mutations: NRAS, SH2B3, PHF6 and TET2. CD34 and CD117 immunstains do not show an increase in blasts. The findings are consistent with persistent [name] with a decrease in the number of blasts compared to that seen on the previous bone marrow.    Final Interpretation      BONE MARROW; FLOW CYTOMETRIC ANALYSIS:   No increased blasts identified. (see comment)  Monocytosis (17% of total events).    COMMENT: Flow cytometry identified about 0.6% of total events as immature cells. These cells express CD45 (dim), CD34,  CD117, HLA-DR, CD38, CD33 (variable). This immunophenotype is consistent with normal myeloblasts.  In addition, monocytes are increased and comprise of approximately 17% of total events. These monocytes show normal expression of CD33/CD64/CD14/CD11b with variable expression of CD4. Correlation with concurrent morphology and clinical data is recommended (see [id number]).    FLOW CYTOMETRY ANALYSIS: CD45 versus side scatter analysis demonstrate a predominance of granulocytes (~70%), monocytes (~17%) and lymphocytes (~6%). No significant increase in blasts identified. All tested markers were used for adequate analysis of the cells and were appropriately reviewed.     12/28/2021 -  Chemotherapy   Patient is on Treatment Plan : MYELODYSPLASIA  Azacitidine IV D1-5 q28d        CURRENT THERAPY:  Azacitidine days 1-5 q28 days, started 12/28/20 Nplate 42mcg/kg weekly Platelet transfusion as needed (plt<15K), blood transfusion if Hg<8.0  INTERVAL HISTORY Ms. Casida returns for follow up and treatment as scheduled. Last seen by Dr. Burr Medico 08/15/22; she completed another cycle of 5 days vidaza.   ROS   Past Medical History:  Diagnosis Date   Acute bronchitis due to COVID-19 virus 05/16/2022   Diabetes ([place])    HLD (hyperlipidemia)    Hypertension    Pernicious anemia    Pneumonia 09/05/2019   Renal insufficiency 09/06/2019   Vitamin D deficiency      Past Surgical History:  Procedure Laterality Date   ABDOMINAL HERNIA REPAIR  2009   ABDOMINOPLASTY     AXILLARY LYMPH NODE BIOPSY Left 11/11/2020   Procedure: LEFT AXILLARY EXCISIONAL LYMPH NODE BIOPSY;  Surgeon: [name], MD;  Location: [facility];  Service: General;  Laterality: Left;   [place]   GASTRIC BYPASS  2000   hemorroid surgery  2007   IR IMAGING GUIDED PORT INSERTION  07/26/2021   IR KYPHO EA ADDL LEVEL THORACIC OR LUMBAR  05/31/2021   IR KYPHO THORACIC WITH BONE BIOPSY  05/31/2021   TONSILLECTOMY AND ADENOIDECTOMY  1957     Outpatient Encounter Medications as of 09/12/2022  Medication Sig Note   cyanocobalamin  (,VITAMIN B-12,) 1000 MCG/ML injection Inject 1,000 mcg into the muscle every 30 (thirty) days.    cyclobenzaprine (FLEXERIL) 5 MG tablet Take 1 tablet (5 mg total) by mouth 3 (three) times daily as needed for muscle spasms.    furosemide (LASIX) 20 MG tablet TAKE 1 TABLET BY MOUTH EVERY DAY FOR UP TO 7 DAYS THEN AS NEEDED FOR LEG EDEMA    glucose blood (ONETOUCH VERIO) test strip Monitor blood sugars twice daily    KLOR-CON M10 10 MEQ tablet TAKE 1 TABLET BY MOUTH TWICE A DAY TAKE ALONG WITH FUROSEMIDE ONLY    Lancets (ONETOUCH ULTRASOFT) lancets Monitor blood sugars twice daily    lidocaine-prilocaine (EMLA) cream APPLY 2 GRAMS TO PORT-A-CATH SITE 30-60 MINUTES PRIOR TO PORT ACCESS AS NEEDED    metoprolol succinate (TOPROL-XL) 25 MG 24 hr tablet Take 1 tablet (25 mg total) by mouth daily.    naloxone (NARCAN) nasal spray 4 mg/0.1 mL Place 1 spray into the nose once as needed (overdose). 07/26/2021: never   omeprazole (PRILOSEC) 20 MG capsule Take 20 mg by mouth daily.    ondansetron (ZOFRAN-ODT) 4 MG disintegrating tablet Take 1 tablet (4 mg total) by mouth every 8 (eight) hours as needed for nausea or vomiting.    Oxycodone HCl 10 MG TABS Take 1 tablet (10 mg total) by mouth 2 (two) times daily as needed.    Oxycodone HCl 10 MG TABS Take 1  tablet (10 mg total) by mouth 2 (two) times daily as needed.    Oxycodone HCl 10 MG TABS Take 1 tablet (10 mg total) by mouth 2 (two) times daily as needed.    polyethylene glycol powder (GLYCOLAX/MIRALAX) 17 GM/SCOOP powder Dissolve 1 capful (17 g) in water and drink 2 (two) times daily. (Patient taking differently: Take 17 g by mouth daily.)    pregabalin (LYRICA) 150 MG capsule Take 1 capsule (150 mg total) by mouth 2 (two) times daily.    prochlorperazine (COMPAZINE) 10 MG tablet Take 10 mg by mouth every 6 (six) hours as needed.    promethazine-dextromethorphan (PROMETHAZINE-DM) 6.25-15 MG/5ML syrup Take 5 mLs by mouth 4 (four) times daily as needed.     senna-docusate (SENOKOT-S) 8.6-50 MG tablet Take 2 tablets by mouth at bedtime as needed for mild constipation.    sitaGLIPtin-metformin (JANUMET) 50-1000 MG tablet Take 1 tablet by mouth at bedtime.    Facility-Administered Encounter Medications as of 09/12/2022  Medication   0.9 %  sodium chloride infusion (Manually program via Guardrails IV Fluids)     There were no vitals filed for this visit. There is no height or weight on file to calculate BMI.   PHYSICAL EXAM GENERAL:alert, no distress and comfortable SKIN: no rash  EYES: sclera clear NECK: without mass LYMPH:  no palpable cervical or supraclavicular lymphadenopathy  LUNGS: clear with normal breathing effort HEART: regular rate & rhythm, no lower extremity edema ABDOMEN: abdomen soft, non-tender and normal bowel sounds NEURO: alert & oriented x 3 with fluent speech, no focal motor/sensory deficits Breast exam:  PAC without erythema    CBC    Component Value Date/Time   WBC 4.7 09/07/2022 1056   WBC 5.8 06/01/2021 0750   RBC 3.20 (L) 09/07/2022 1056   HGB 9.0 (L) 09/07/2022 1056   HGB 11.4 (L) 07/23/2015 0910   HCT 27.8 (L) 09/07/2022 1056   HCT 34.9 07/23/2015 0910   PLT 76 (L) 09/07/2022 1056   PLT 103 (L) 07/23/2015 0910   MCV 86.9 09/07/2022 1056   MCV 87.5 07/23/2015 0910   MCH 28.1 09/07/2022 1056   MCHC 32.4 09/07/2022 1056   RDW 23.4 (H) 09/07/2022 1056   RDW 14.8 (H) 07/23/2015 0910   LYMPHSABS 0.5 (L) 09/07/2022 1056   LYMPHSABS 3.1 07/23/2015 0910   MONOABS 1.0 09/07/2022 1056   MONOABS 2.5 (H) 07/23/2015 0910   EOSABS 0.0 09/07/2022 1056   EOSABS 0.0 07/23/2015 0910   BASOSABS 0.0 09/07/2022 1056   BASOSABS 0.0 07/23/2015 0910     CMP     Component Value Date/Time   NA 141 08/31/2022 0822   NA 137 02/18/2021 0000   K 4.1 08/31/2022 0822   CL 109 08/31/2022 0822   CO2 27 08/31/2022 0822   GLUCOSE 189 (H) 08/31/2022 0822   BUN 12 08/31/2022 0822   BUN 13 02/18/2021 0000   CREATININE 0.58  08/31/2022 0822   CREATININE 0.64 08/10/2022 1402   CALCIUM 8.5 (L) 08/31/2022 0822   PROT 6.2 (L) 08/31/2022 0822   ALBUMIN 3.3 (L) 08/31/2022 0822   AST 18 08/31/2022 0822   AST 28 07/27/2022 1405   ALT 18 08/31/2022 0822   ALT 31 07/27/2022 1405   ALKPHOS 130 (H) 08/31/2022 0822   BILITOT 1.1 08/31/2022 0822   BILITOT 1.4 (H) 07/27/2022 1405   GFRNONAA >60 08/31/2022 0822   GFRNONAA >60 08/10/2022 1402   GFRAA 45 (L) 09/07/2019 0350   GFRAA >60 11/20/2017 [id number]  ASSESSMENT & PLAN:Jerni Jerilynn Mages Kathleen Gibson is a 72 y.o. female with   1. [name] with severe thrombocytopenia  -Initially diagnosed with ITP in 2014. She lost follow up after 11/2017 and did not proceed with recommended bone marrow biopsy. -She was referred to ED 10/28/20 by her PCP for low plt count of 4k. She was hospitalized and treated with platelet transfusions, IVIG, oral prednisone 60mg  and Nplate injections. She tapered off prednisone given little response. -Bone marrow biopsy 10/30/20 felt to likely represent MDS/MPN, particularly [name].  Confirmed on slide review at [facility] by Dr. Linus Orn -She began treatment for severe refractory thrombocytopenia with weekly Rituxan x4 on 11/19/20, she did not respond  -She began azacitidine daily days 1-5 q. 28 days on 12/28/2020 -She had worsening thrombocytopenia after stopping Nplate, restarted weekly Nplate 10 mcg/kg on 624THL.  Dose adjustment per platelet level, currently 5 mcg/kg -Bone marrow biopsy 03/18/2021 showed stable disease, no increase in blasts. -She continues azacitidine daily days 1-5 q. 28 days; and weekly Nplate -Saw Dr. Linus Orn 08/11/22   2.  T7/T8 compression fracture -Platelet count improved, she was able to undergo T10 and T12 kyphoplasty 05/31/2021  -No longer using thoracic brace -Pain managed on rare use of oxycodone -Restart calcium and vitamin D -She has not been able to find a dentist who takes Medicare for Zometa clearance, I previously referred her  to Daneen Schick, [place]. Zometa remains on hold   3. Normocytic anemia, moderate  -H/o pernicious anemia/vitamin B12 deficiency -Transfuse for Hgb </= 7.5 -Continue oral iron and monthly B12 inj   4. Type 2 diabetes mellitus with polyneuropathy, Hypertension, anxiety -Continue medication, follow-up PCP -Ativan as needed    PLAN:  No orders of the defined types were placed in this encounter.     All questions were answered. The patient knows to call the clinic with any problems, questions or concerns. No barriers to learning were detected. I spent *** counseling the patient face to face. The total time spent in the appointment was *** and more than 50% was on counseling, review of test results, and coordination of care.   Cira Rue, NP-C @DATE @

## 2022-09-12 ENCOUNTER — Inpatient Hospital Stay: Payer: Medicare HMO

## 2022-09-12 ENCOUNTER — Telehealth: Payer: Self-pay

## 2022-09-12 ENCOUNTER — Encounter: Payer: Self-pay | Admitting: Nurse Practitioner

## 2022-09-12 ENCOUNTER — Other Ambulatory Visit: Payer: Self-pay

## 2022-09-12 ENCOUNTER — Inpatient Hospital Stay: Payer: Medicare HMO | Attending: Hematology | Admitting: Nurse Practitioner

## 2022-09-12 DIAGNOSIS — D693 Immune thrombocytopenic purpura: Secondary | ICD-10-CM | POA: Diagnosis not present

## 2022-09-12 DIAGNOSIS — Z7952 Long term (current) use of systemic steroids: Secondary | ICD-10-CM | POA: Diagnosis not present

## 2022-09-12 DIAGNOSIS — Z7984 Long term (current) use of oral hypoglycemic drugs: Secondary | ICD-10-CM | POA: Insufficient documentation

## 2022-09-12 DIAGNOSIS — Z9884 Bariatric surgery status: Secondary | ICD-10-CM | POA: Insufficient documentation

## 2022-09-12 DIAGNOSIS — R519 Headache, unspecified: Secondary | ICD-10-CM | POA: Insufficient documentation

## 2022-09-12 DIAGNOSIS — E538 Deficiency of other specified B group vitamins: Secondary | ICD-10-CM | POA: Insufficient documentation

## 2022-09-12 DIAGNOSIS — E1142 Type 2 diabetes mellitus with diabetic polyneuropathy: Secondary | ICD-10-CM | POA: Diagnosis not present

## 2022-09-12 DIAGNOSIS — C9310 Chronic myelomonocytic leukemia not having achieved remission: Secondary | ICD-10-CM | POA: Insufficient documentation

## 2022-09-12 DIAGNOSIS — Z79899 Other long term (current) drug therapy: Secondary | ICD-10-CM | POA: Diagnosis not present

## 2022-09-12 DIAGNOSIS — I10 Essential (primary) hypertension: Secondary | ICD-10-CM | POA: Diagnosis not present

## 2022-09-12 DIAGNOSIS — Z5111 Encounter for antineoplastic chemotherapy: Secondary | ICD-10-CM | POA: Diagnosis not present

## 2022-09-12 DIAGNOSIS — Z95828 Presence of other vascular implants and grafts: Secondary | ICD-10-CM

## 2022-09-12 DIAGNOSIS — D696 Thrombocytopenia, unspecified: Secondary | ICD-10-CM

## 2022-09-12 LAB — CBC WITH DIFFERENTIAL (CANCER CENTER ONLY)
Abs Immature Granulocytes: 0.03 10*3/uL (ref 0.00–0.07)
Basophils Absolute: 0 10*3/uL (ref 0.0–0.1)
Basophils Relative: 1 %
Eosinophils Absolute: 0 10*3/uL (ref 0.0–0.5)
Eosinophils Relative: 1 %
HCT: 27.4 % — ABNORMAL LOW (ref 36.0–46.0)
Hemoglobin: 9.1 g/dL — ABNORMAL LOW (ref 12.0–15.0)
Immature Granulocytes: 1 %
Lymphocytes Relative: 13 %
Lymphs Abs: 0.5 10*3/uL — ABNORMAL LOW (ref 0.7–4.0)
MCH: 28.6 pg (ref 26.0–34.0)
MCHC: 33.2 g/dL (ref 30.0–36.0)
MCV: 86.2 fL (ref 80.0–100.0)
Monocytes Absolute: 1 10*3/uL (ref 0.1–1.0)
Monocytes Relative: 26 %
Neutro Abs: 2.2 10*3/uL (ref 1.7–7.7)
Neutrophils Relative %: 58 %
Platelet Count: 83 10*3/uL — ABNORMAL LOW (ref 150–400)
RBC: 3.18 MIL/uL — ABNORMAL LOW (ref 3.87–5.11)
RDW: 22.8 % — ABNORMAL HIGH (ref 11.5–15.5)
WBC Count: 3.8 10*3/uL — ABNORMAL LOW (ref 4.0–10.5)
nRBC: 0 % (ref 0.0–0.2)

## 2022-09-12 LAB — SAMPLE TO BLOOD BANK

## 2022-09-12 LAB — COMPREHENSIVE METABOLIC PANEL
ALT: 19 U/L (ref 0–44)
AST: 20 U/L (ref 15–41)
Albumin: 3 g/dL — ABNORMAL LOW (ref 3.5–5.0)
Alkaline Phosphatase: 139 U/L — ABNORMAL HIGH (ref 38–126)
Anion gap: 4 — ABNORMAL LOW (ref 5–15)
BUN: 13 mg/dL (ref 8–23)
CO2: 27 mmol/L (ref 22–32)
Calcium: 8.4 mg/dL — ABNORMAL LOW (ref 8.9–10.3)
Chloride: 110 mmol/L (ref 98–111)
Creatinine, Ser: 0.62 mg/dL (ref 0.44–1.00)
GFR, Estimated: >60 mL/min (ref >60)
Glucose, Bld: 140 mg/dL — ABNORMAL HIGH (ref 70–99)
Potassium: 4.1 mmol/L (ref 3.5–5.1)
Sodium: 141 mmol/L (ref 135–145)
Total Bilirubin: 1.3 mg/dL — ABNORMAL HIGH (ref 0.3–1.2)
Total Protein: 6 g/dL — ABNORMAL LOW (ref 6.5–8.1)

## 2022-09-12 LAB — VITAMIN B12: Vitamin B-12: 1963 pg/mL — ABNORMAL HIGH (ref 180–914)

## 2022-09-12 MED ORDER — SODIUM CHLORIDE 0.9% FLUSH
10.0000 mL | Freq: Once | INTRAVENOUS | Status: AC
  Administered 2022-09-12: 10 mL

## 2022-09-12 MED ORDER — SODIUM CHLORIDE 0.9 % IV SOLN: Freq: Once | INTRAVENOUS | Status: AC

## 2022-09-12 MED ORDER — SODIUM CHLORIDE 0.9% FLUSH
10.0000 mL | INTRAVENOUS | Status: DC | PRN
  Administered 2022-09-12: 10 mL

## 2022-09-12 MED ORDER — HEPARIN SOD (PORK) LOCK FLUSH 100 UNIT/ML IV SOLN
500.0000 [IU] | Freq: Once | INTRAVENOUS | Status: AC | PRN
  Administered 2022-09-12: 500 [IU]

## 2022-09-12 MED ORDER — SODIUM CHLORIDE 0.9 % IV SOLN
75.0000 mg/m2 | Freq: Once | INTRAVENOUS | Status: AC
  Administered 2022-09-12: 130 mg via INTRAVENOUS
  Filled 2022-09-12: qty 13

## 2022-09-12 MED ORDER — SODIUM CHLORIDE 0.9 % IV SOLN
10.0000 mg | Freq: Once | INTRAVENOUS | Status: AC
  Administered 2022-09-12: 10 mg via INTRAVENOUS
  Filled 2022-09-12: qty 10

## 2022-09-12 MED FILL — Dexamethasone Sodium Phosphate Inj 100 MG/10ML: INTRAMUSCULAR | Qty: 1 | Status: AC

## 2022-09-12 NOTE — Telephone Encounter (Signed)
CRITICAL VALUE STICKER  CRITICAL VALUE: B12 Level 1962  RECEIVER (on-site recipient of call): Maurine Simmering CMA  DATE & TIME NOTIFIED: 09/12/2022  MESSENGER (representative from lab): Alexus in Lab  MD NOTIFIED: Cira Rue NP  TIME OF NOTIFICATION: [id number]   RESPONSE: Made Cira Rue NP aware  This was a correction it was originally reported as being 66 but it was actually 1962

## 2022-09-13 ENCOUNTER — Inpatient Hospital Stay: Payer: Medicare HMO

## 2022-09-13 VITALS — BP 115/60 | HR 78 | Temp 97.9°F | Resp 18

## 2022-09-13 DIAGNOSIS — D693 Immune thrombocytopenic purpura: Secondary | ICD-10-CM | POA: Diagnosis not present

## 2022-09-13 DIAGNOSIS — Z79899 Other long term (current) drug therapy: Secondary | ICD-10-CM | POA: Diagnosis not present

## 2022-09-13 DIAGNOSIS — R519 Headache, unspecified: Secondary | ICD-10-CM | POA: Diagnosis not present

## 2022-09-13 DIAGNOSIS — Z7984 Long term (current) use of oral hypoglycemic drugs: Secondary | ICD-10-CM | POA: Diagnosis not present

## 2022-09-13 DIAGNOSIS — Z9884 Bariatric surgery status: Secondary | ICD-10-CM | POA: Diagnosis not present

## 2022-09-13 DIAGNOSIS — C9310 Chronic myelomonocytic leukemia not having achieved remission: Secondary | ICD-10-CM | POA: Diagnosis not present

## 2022-09-13 DIAGNOSIS — E538 Deficiency of other specified B group vitamins: Secondary | ICD-10-CM | POA: Diagnosis not present

## 2022-09-13 DIAGNOSIS — E1142 Type 2 diabetes mellitus with diabetic polyneuropathy: Secondary | ICD-10-CM | POA: Diagnosis not present

## 2022-09-13 DIAGNOSIS — Z5111 Encounter for antineoplastic chemotherapy: Secondary | ICD-10-CM | POA: Diagnosis not present

## 2022-09-13 DIAGNOSIS — I10 Essential (primary) hypertension: Secondary | ICD-10-CM | POA: Diagnosis not present

## 2022-09-13 DIAGNOSIS — Z7952 Long term (current) use of systemic steroids: Secondary | ICD-10-CM | POA: Diagnosis not present

## 2022-09-13 MED ORDER — SODIUM CHLORIDE 0.9 % IV SOLN: Freq: Once | INTRAVENOUS | Status: AC

## 2022-09-13 MED ORDER — SODIUM CHLORIDE 0.9 % IV SOLN
75.0000 mg/m2 | Freq: Once | INTRAVENOUS | Status: AC
  Administered 2022-09-13: 130 mg via INTRAVENOUS
  Filled 2022-09-13: qty 13

## 2022-09-13 MED ORDER — SODIUM CHLORIDE 0.9 % IV SOLN
10.0000 mg | Freq: Once | INTRAVENOUS | Status: AC
  Administered 2022-09-13: 10 mg via INTRAVENOUS
  Filled 2022-09-13: qty 10

## 2022-09-13 MED FILL — Dexamethasone Sodium Phosphate Inj 100 MG/10ML: INTRAMUSCULAR | Qty: 1 | Status: AC

## 2022-09-14 ENCOUNTER — Inpatient Hospital Stay: Payer: Medicare HMO

## 2022-09-14 VITALS — BP 99/51 | HR 75 | Temp 98.3°F | Resp 18 | Ht 63.0 in | Wt 148.0 lb

## 2022-09-14 DIAGNOSIS — I10 Essential (primary) hypertension: Secondary | ICD-10-CM | POA: Diagnosis not present

## 2022-09-14 DIAGNOSIS — D693 Immune thrombocytopenic purpura: Secondary | ICD-10-CM | POA: Diagnosis not present

## 2022-09-14 DIAGNOSIS — D696 Thrombocytopenia, unspecified: Secondary | ICD-10-CM

## 2022-09-14 DIAGNOSIS — R519 Headache, unspecified: Secondary | ICD-10-CM | POA: Diagnosis not present

## 2022-09-14 DIAGNOSIS — Z95828 Presence of other vascular implants and grafts: Secondary | ICD-10-CM

## 2022-09-14 DIAGNOSIS — E1142 Type 2 diabetes mellitus with diabetic polyneuropathy: Secondary | ICD-10-CM | POA: Diagnosis not present

## 2022-09-14 DIAGNOSIS — C9310 Chronic myelomonocytic leukemia not having achieved remission: Secondary | ICD-10-CM

## 2022-09-14 DIAGNOSIS — E538 Deficiency of other specified B group vitamins: Secondary | ICD-10-CM | POA: Diagnosis not present

## 2022-09-14 DIAGNOSIS — Z5111 Encounter for antineoplastic chemotherapy: Secondary | ICD-10-CM | POA: Diagnosis not present

## 2022-09-14 DIAGNOSIS — Z7984 Long term (current) use of oral hypoglycemic drugs: Secondary | ICD-10-CM | POA: Diagnosis not present

## 2022-09-14 DIAGNOSIS — Z9884 Bariatric surgery status: Secondary | ICD-10-CM | POA: Diagnosis not present

## 2022-09-14 DIAGNOSIS — Z79899 Other long term (current) drug therapy: Secondary | ICD-10-CM | POA: Diagnosis not present

## 2022-09-14 DIAGNOSIS — Z7952 Long term (current) use of systemic steroids: Secondary | ICD-10-CM | POA: Diagnosis not present

## 2022-09-14 MED ORDER — SODIUM CHLORIDE 0.9 % IV SOLN
75.0000 mg/m2 | Freq: Once | INTRAVENOUS | Status: AC
  Administered 2022-09-14: 130 mg via INTRAVENOUS
  Filled 2022-09-14: qty 13

## 2022-09-14 MED ORDER — ROMIPLOSTIM INJECTION 500 MCG
550.0000 ug | Freq: Once | SUBCUTANEOUS | Status: AC
  Administered 2022-09-14: 550 ug via SUBCUTANEOUS
  Filled 2022-09-14: qty 1.1

## 2022-09-14 MED ORDER — SODIUM CHLORIDE 0.9 % IV SOLN
10.0000 mg | Freq: Once | INTRAVENOUS | Status: AC
  Administered 2022-09-14: 10 mg via INTRAVENOUS
  Filled 2022-09-14: qty 10

## 2022-09-14 MED ORDER — SODIUM CHLORIDE 0.9% FLUSH
10.0000 mL | INTRAVENOUS | Status: DC | PRN
  Administered 2022-09-14: 10 mL

## 2022-09-14 MED ORDER — HEPARIN SOD (PORK) LOCK FLUSH 100 UNIT/ML IV SOLN
500.0000 [IU] | Freq: Once | INTRAVENOUS | Status: AC | PRN
  Administered 2022-09-14: 500 [IU]

## 2022-09-14 MED ORDER — SODIUM CHLORIDE 0.9 % IV SOLN: Freq: Once | INTRAVENOUS | Status: AC

## 2022-09-14 MED FILL — Dexamethasone Sodium Phosphate Inj 100 MG/10ML: INTRAMUSCULAR | Qty: 1 | Status: AC

## 2022-09-14 NOTE — Patient Instructions (Signed)
[facility]  Discharge Instructions: Thank you for choosing [facility] to provide your oncology and hematology care.   If you have a lab appointment with the [facility], please go directly to the [facility] and check in at the registration area.   Wear comfortable clothing and clothing appropriate for easy access to any Portacath or PICC line.   We strive to give you quality time with your provider. You may need to reschedule your appointment if you arrive late (15 or more minutes).  Arriving late affects you and other patients whose appointments are after yours.  Also, if you miss three or more appointments without notifying the office, you may be dismissed from the clinic at the provider's discretion.      For prescription refill requests, have your pharmacy contact our office and allow 72 hours for refills to be completed.    Today you received the following chemotherapy and/or immunotherapy agents Vidaza & NPlate      To help prevent nausea and vomiting after your treatment, we encourage you to take your nausea medication as directed.  BELOW ARE SYMPTOMS THAT SHOULD BE REPORTED IMMEDIATELY: *FEVER GREATER THAN 100.4 F (38 C) OR HIGHER *CHILLS OR SWEATING *NAUSEA AND VOMITING THAT IS NOT CONTROLLED WITH YOUR NAUSEA MEDICATION *UNUSUAL SHORTNESS OF BREATH *UNUSUAL BRUISING OR BLEEDING *URINARY PROBLEMS (pain or burning when urinating, or frequent urination) *BOWEL PROBLEMS (unusual diarrhea, constipation, pain near the anus) TENDERNESS IN MOUTH AND THROAT WITH OR WITHOUT PRESENCE OF ULCERS (sore throat, sores in mouth, or a toothache) UNUSUAL RASH, SWELLING OR PAIN  UNUSUAL VAGINAL DISCHARGE OR ITCHING   Items with * indicate a potential emergency and should be followed up as soon as possible or go to the Emergency Department if any problems should occur.  Please show the CHEMOTHERAPY ALERT CARD or IMMUNOTHERAPY ALERT CARD at  check-in to the Emergency Department and triage nurse.  Should you have questions after your visit or need to cancel or reschedule your appointment, please contact [facility]  Dept: [phone]  and follow the prompts.  Office hours are 8:00 a.m. to 4:30 p.m. Monday - Friday. Please note that voicemails left after 4:00 p.m. may not be returned until the following business day.  We are closed weekends and major holidays. You have access to a nurse at all times for urgent questions. Please call the main number to the clinic Dept: [phone] and follow the prompts.   For any non-urgent questions, you may also contact your provider using MyChart. We now offer e-Visits for anyone 52 and older to request care online for non-urgent symptoms. For details visit [email or web address].   Also download the MyChart app! Go to the app store, search "MyChart", open the app, select [facility], and log in with your MyChart username and password.

## 2022-09-15 ENCOUNTER — Inpatient Hospital Stay: Payer: Medicare HMO

## 2022-09-15 VITALS — BP 126/66 | HR 72 | Temp 98.3°F | Resp 16

## 2022-09-15 DIAGNOSIS — Z79899 Other long term (current) drug therapy: Secondary | ICD-10-CM | POA: Diagnosis not present

## 2022-09-15 DIAGNOSIS — R519 Headache, unspecified: Secondary | ICD-10-CM | POA: Diagnosis not present

## 2022-09-15 DIAGNOSIS — Z5111 Encounter for antineoplastic chemotherapy: Secondary | ICD-10-CM | POA: Diagnosis not present

## 2022-09-15 DIAGNOSIS — Z9884 Bariatric surgery status: Secondary | ICD-10-CM | POA: Diagnosis not present

## 2022-09-15 DIAGNOSIS — I10 Essential (primary) hypertension: Secondary | ICD-10-CM | POA: Diagnosis not present

## 2022-09-15 DIAGNOSIS — E538 Deficiency of other specified B group vitamins: Secondary | ICD-10-CM | POA: Diagnosis not present

## 2022-09-15 DIAGNOSIS — Z7984 Long term (current) use of oral hypoglycemic drugs: Secondary | ICD-10-CM | POA: Diagnosis not present

## 2022-09-15 DIAGNOSIS — E1142 Type 2 diabetes mellitus with diabetic polyneuropathy: Secondary | ICD-10-CM | POA: Diagnosis not present

## 2022-09-15 DIAGNOSIS — C9310 Chronic myelomonocytic leukemia not having achieved remission: Secondary | ICD-10-CM

## 2022-09-15 DIAGNOSIS — Z7952 Long term (current) use of systemic steroids: Secondary | ICD-10-CM | POA: Diagnosis not present

## 2022-09-15 DIAGNOSIS — D693 Immune thrombocytopenic purpura: Secondary | ICD-10-CM | POA: Diagnosis not present

## 2022-09-15 MED ORDER — SODIUM CHLORIDE 0.9 % IV SOLN
10.0000 mg | Freq: Once | INTRAVENOUS | Status: AC
  Administered 2022-09-15: 10 mg via INTRAVENOUS
  Filled 2022-09-15: qty 10

## 2022-09-15 MED ORDER — SODIUM CHLORIDE 0.9 % IV SOLN
75.0000 mg/m2 | Freq: Once | INTRAVENOUS | Status: AC
  Administered 2022-09-15: 130 mg via INTRAVENOUS
  Filled 2022-09-15: qty 13

## 2022-09-15 MED ORDER — SODIUM CHLORIDE 0.9% FLUSH
10.0000 mL | INTRAVENOUS | Status: DC | PRN
  Administered 2022-09-15: 10 mL

## 2022-09-15 MED ORDER — SODIUM CHLORIDE 0.9 % IV SOLN: Freq: Once | INTRAVENOUS | Status: AC

## 2022-09-15 MED ORDER — HEPARIN SOD (PORK) LOCK FLUSH 100 UNIT/ML IV SOLN
500.0000 [IU] | Freq: Once | INTRAVENOUS | Status: AC | PRN
  Administered 2022-09-15: 500 [IU]

## 2022-09-15 MED FILL — Dexamethasone Sodium Phosphate Inj 100 MG/10ML: INTRAMUSCULAR | Qty: 1 | Status: AC

## 2022-09-15 NOTE — Patient Instructions (Signed)
[facility]  Discharge Instructions: Thank you for choosing [facility] to provide your oncology and hematology care.   If you have a lab appointment with the Cancer Center, please go directly to the Cancer Center and check in at the registration area.   Wear comfortable clothing and clothing appropriate for easy access to any Portacath or PICC line.   We strive to give you quality time with your provider. You may need to reschedule your appointment if you arrive late (15 or more minutes).  Arriving late affects you and other patients whose appointments are after yours.  Also, if you miss three or more appointments without notifying the office, you may be dismissed from the clinic at the provider's discretion.      For prescription refill requests, have your pharmacy contact our office and allow 72 hours for refills to be completed.    Today you received the following chemotherapy and/or immunotherapy agents: Vidaza     To help prevent nausea and vomiting after your treatment, we encourage you to take your nausea medication as directed.  BELOW ARE SYMPTOMS THAT SHOULD BE REPORTED IMMEDIATELY: *FEVER GREATER THAN 100.4 F (38 C) OR HIGHER *CHILLS OR SWEATING *NAUSEA AND VOMITING THAT IS NOT CONTROLLED WITH YOUR NAUSEA MEDICATION *UNUSUAL SHORTNESS OF BREATH *UNUSUAL BRUISING OR BLEEDING *URINARY PROBLEMS (pain or burning when urinating, or frequent urination) *BOWEL PROBLEMS (unusual diarrhea, constipation, pain near the anus) TENDERNESS IN MOUTH AND THROAT WITH OR WITHOUT PRESENCE OF ULCERS (sore throat, sores in mouth, or a toothache) UNUSUAL RASH, SWELLING OR PAIN  UNUSUAL VAGINAL DISCHARGE OR ITCHING   Items with * indicate a potential emergency and should be followed up as soon as possible or go to the Emergency Department if any problems should occur.  Please show the CHEMOTHERAPY ALERT CARD or IMMUNOTHERAPY ALERT CARD at check-in  to the Emergency Department and triage nurse.  Should you have questions after your visit or need to cancel or reschedule your appointment, please contact [facility]  Dept: [phone]  and follow the prompts.  Office hours are 8:00 a.m. to 4:30 p.m. Monday - Friday. Please note that voicemails left after 4:00 p.m. may not be returned until the following business day.  We are closed weekends and major holidays. You have access to a nurse at all times for urgent questions. Please call the main number to the clinic Dept: [phone] and follow the prompts.   For any non-urgent questions, you may also contact your provider using MyChart. We now offer e-Visits for anyone 18 and older to request care online for non-urgent symptoms. For details visit [email or web address].   Also download the MyChart app! Go to the app store, search "MyChart", open the app, select [facility], and log in with your MyChart username and password.  

## 2022-09-16 ENCOUNTER — Inpatient Hospital Stay: Payer: Medicare HMO

## 2022-09-16 ENCOUNTER — Other Ambulatory Visit: Payer: Self-pay | Admitting: Hematology

## 2022-09-16 VITALS — BP 122/68 | HR 72 | Temp 98.2°F | Resp 18

## 2022-09-16 DIAGNOSIS — E1142 Type 2 diabetes mellitus with diabetic polyneuropathy: Secondary | ICD-10-CM | POA: Diagnosis not present

## 2022-09-16 DIAGNOSIS — Z7984 Long term (current) use of oral hypoglycemic drugs: Secondary | ICD-10-CM | POA: Diagnosis not present

## 2022-09-16 DIAGNOSIS — D693 Immune thrombocytopenic purpura: Secondary | ICD-10-CM | POA: Diagnosis not present

## 2022-09-16 DIAGNOSIS — C9310 Chronic myelomonocytic leukemia not having achieved remission: Secondary | ICD-10-CM | POA: Diagnosis not present

## 2022-09-16 DIAGNOSIS — Z5111 Encounter for antineoplastic chemotherapy: Secondary | ICD-10-CM | POA: Diagnosis not present

## 2022-09-16 DIAGNOSIS — I10 Essential (primary) hypertension: Secondary | ICD-10-CM | POA: Diagnosis not present

## 2022-09-16 DIAGNOSIS — R519 Headache, unspecified: Secondary | ICD-10-CM | POA: Diagnosis not present

## 2022-09-16 DIAGNOSIS — Z9884 Bariatric surgery status: Secondary | ICD-10-CM | POA: Diagnosis not present

## 2022-09-16 DIAGNOSIS — Z7952 Long term (current) use of systemic steroids: Secondary | ICD-10-CM | POA: Diagnosis not present

## 2022-09-16 DIAGNOSIS — Z79899 Other long term (current) drug therapy: Secondary | ICD-10-CM | POA: Diagnosis not present

## 2022-09-16 DIAGNOSIS — E538 Deficiency of other specified B group vitamins: Secondary | ICD-10-CM | POA: Diagnosis not present

## 2022-09-16 MED ORDER — SODIUM CHLORIDE 0.9 % IV SOLN
10.0000 mg | Freq: Once | INTRAVENOUS | Status: AC
  Administered 2022-09-16: 10 mg via INTRAVENOUS
  Filled 2022-09-16: qty 10

## 2022-09-16 MED ORDER — SODIUM CHLORIDE 0.9 % IV SOLN: Freq: Once | INTRAVENOUS | Status: AC

## 2022-09-16 MED ORDER — SODIUM CHLORIDE 0.9 % IV SOLN
75.0000 mg/m2 | Freq: Once | INTRAVENOUS | Status: AC
  Administered 2022-09-16: 130 mg via INTRAVENOUS
  Filled 2022-09-16: qty 13

## 2022-09-19 ENCOUNTER — Encounter (HOSPITAL_COMMUNITY): Payer: Self-pay

## 2022-09-19 ENCOUNTER — Emergency Department (HOSPITAL_COMMUNITY)
Admission: EM | Admit: 2022-09-19 | Discharge: 2022-09-19 | Disposition: A | Discharge status: Home / Self Care | Payer: Medicare HMO | Attending: Emergency Medicine | Admitting: Emergency Medicine

## 2022-09-19 ENCOUNTER — Emergency Department (HOSPITAL_COMMUNITY): Payer: Medicare HMO

## 2022-09-19 ENCOUNTER — Other Ambulatory Visit: Payer: Self-pay

## 2022-09-19 ENCOUNTER — Ambulatory Visit: Payer: Medicare HMO | Admitting: Family Medicine

## 2022-09-19 DIAGNOSIS — E119 Type 2 diabetes mellitus without complications: Secondary | ICD-10-CM | POA: Diagnosis not present

## 2022-09-19 DIAGNOSIS — D735 Infarction of spleen: Secondary | ICD-10-CM | POA: Insufficient documentation

## 2022-09-19 DIAGNOSIS — Z23 Encounter for immunization: Secondary | ICD-10-CM | POA: Insufficient documentation

## 2022-09-19 DIAGNOSIS — S20212A Contusion of left front wall of thorax, initial encounter: Secondary | ICD-10-CM | POA: Diagnosis not present

## 2022-09-19 DIAGNOSIS — Z043 Encounter for examination and observation following other accident: Secondary | ICD-10-CM | POA: Diagnosis not present

## 2022-09-19 DIAGNOSIS — Z79899 Other long term (current) drug therapy: Secondary | ICD-10-CM | POA: Insufficient documentation

## 2022-09-19 DIAGNOSIS — R0902 Hypoxemia: Secondary | ICD-10-CM | POA: Diagnosis not present

## 2022-09-19 DIAGNOSIS — R519 Headache, unspecified: Secondary | ICD-10-CM | POA: Diagnosis not present

## 2022-09-19 DIAGNOSIS — Z7984 Long term (current) use of oral hypoglycemic drugs: Secondary | ICD-10-CM | POA: Diagnosis not present

## 2022-09-19 DIAGNOSIS — I10 Essential (primary) hypertension: Secondary | ICD-10-CM | POA: Insufficient documentation

## 2022-09-19 DIAGNOSIS — S299XXA Unspecified injury of thorax, initial encounter: Secondary | ICD-10-CM | POA: Diagnosis present

## 2022-09-19 DIAGNOSIS — W108XXA Fall (on) (from) other stairs and steps, initial encounter: Secondary | ICD-10-CM | POA: Diagnosis not present

## 2022-09-19 DIAGNOSIS — W19XXXA Unspecified fall, initial encounter: Secondary | ICD-10-CM

## 2022-09-19 DIAGNOSIS — R1010 Upper abdominal pain, unspecified: Secondary | ICD-10-CM | POA: Diagnosis not present

## 2022-09-19 DIAGNOSIS — R162 Hepatomegaly with splenomegaly, not elsewhere classified: Secondary | ICD-10-CM | POA: Diagnosis not present

## 2022-09-19 DIAGNOSIS — R0781 Pleurodynia: Secondary | ICD-10-CM

## 2022-09-19 DIAGNOSIS — Z743 Need for continuous supervision: Secondary | ICD-10-CM | POA: Diagnosis not present

## 2022-09-19 LAB — CBC WITH DIFFERENTIAL/PLATELET
Abs Immature Granulocytes: 0 10*3/uL (ref 0.00–0.07)
Basophils Absolute: 0 10*3/uL (ref 0.0–0.1)
Basophils Relative: 0 %
Eosinophils Absolute: 0 10*3/uL (ref 0.0–0.5)
Eosinophils Relative: 0 %
HCT: 28.1 % — ABNORMAL LOW (ref 36.0–46.0)
Hemoglobin: 8.9 g/dL — ABNORMAL LOW (ref 12.0–15.0)
Lymphocytes Relative: 13 %
Lymphs Abs: 0.6 10*3/uL — ABNORMAL LOW (ref 0.7–4.0)
MCH: 29.5 pg (ref 26.0–34.0)
MCHC: 31.7 g/dL (ref 30.0–36.0)
MCV: 93 fL (ref 80.0–100.0)
Monocytes Absolute: 0.6 10*3/uL (ref 0.1–1.0)
Monocytes Relative: 12 %
Neutro Abs: 3.5 10*3/uL (ref 1.7–7.7)
Neutrophils Relative %: 75 %
Platelets: 70 10*3/uL — ABNORMAL LOW (ref 150–400)
RBC: 3.02 MIL/uL — ABNORMAL LOW (ref 3.87–5.11)
RDW: 22.5 % — ABNORMAL HIGH (ref 11.5–15.5)
Smear Review: DECREASED
WBC: 4.7 10*3/uL (ref 4.0–10.5)
nRBC: 0 % (ref 0.0–0.2)

## 2022-09-19 LAB — BASIC METABOLIC PANEL
Anion gap: 5 (ref 5–15)
BUN: 11 mg/dL (ref 8–23)
CO2: 23 mmol/L (ref 22–32)
Calcium: 7.3 mg/dL — ABNORMAL LOW (ref 8.9–10.3)
Chloride: 112 mmol/L — ABNORMAL HIGH (ref 98–111)
Creatinine, Ser: 0.94 mg/dL (ref 0.44–1.00)
GFR, Estimated: >60 mL/min (ref >60)
Glucose, Bld: 81 mg/dL (ref 70–99)
Potassium: 3.3 mmol/L — ABNORMAL LOW (ref 3.5–5.1)
Sodium: 140 mmol/L (ref 135–145)

## 2022-09-19 MED ORDER — ACETAMINOPHEN 500 MG PO TABS
1000.0000 mg | ORAL_TABLET | Freq: Once | ORAL | Status: AC
  Administered 2022-09-19: 1000 mg via ORAL
  Filled 2022-09-19: qty 2

## 2022-09-19 MED ORDER — BACITRACIN ZINC 500 UNIT/GM EX OINT
TOPICAL_OINTMENT | Freq: Once | CUTANEOUS | Status: AC
  Administered 2022-09-19: 1 via TOPICAL
  Filled 2022-09-19: qty 0.9

## 2022-09-19 MED ORDER — MORPHINE SULFATE (PF) 4 MG/ML IV SOLN
4.0000 mg | Freq: Once | INTRAVENOUS | Status: AC
  Administered 2022-09-19: 4 mg via INTRAVENOUS
  Filled 2022-09-19: qty 1

## 2022-09-19 MED ORDER — POTASSIUM CHLORIDE 20 MEQ PO PACK
40.0000 meq | PACK | Freq: Once | ORAL | Status: AC
  Administered 2022-09-19: 40 meq via ORAL
  Filled 2022-09-19: qty 2

## 2022-09-19 MED ORDER — ONDANSETRON HCL 4 MG/2ML IJ SOLN
4.0000 mg | Freq: Once | INTRAMUSCULAR | Status: AC
  Administered 2022-09-19: 4 mg via INTRAVENOUS
  Filled 2022-09-19: qty 2

## 2022-09-19 MED ORDER — HEPARIN SOD (PORK) LOCK FLUSH 100 UNIT/ML IV SOLN
500.0000 [IU] | Freq: Once | INTRAVENOUS | Status: AC
  Administered 2022-09-19: 500 [IU]
  Filled 2022-09-19: qty 5

## 2022-09-19 MED ORDER — IOHEXOL 350 MG/ML SOLN
75.0000 mL | Freq: Once | INTRAVENOUS | Status: AC | PRN
  Administered 2022-09-19: 75 mL via INTRAVENOUS

## 2022-09-19 MED ORDER — TETANUS-DIPHTH-ACELL PERTUSSIS 5-2.5-18.5 LF-MCG/0.5 IM SUSY
0.5000 mL | PREFILLED_SYRINGE | Freq: Once | INTRAMUSCULAR | Status: AC
  Administered 2022-09-19: 0.5 mL via INTRAMUSCULAR
  Filled 2022-09-19: qty 0.5

## 2022-09-19 MED ORDER — LIDOCAINE 4 % EX PTCH: 2.0000 | MEDICATED_PATCH | CUTANEOUS | 0 refills | Status: DC

## 2022-09-19 NOTE — ED Notes (Signed)
Pt was able to walk in the hallway with no assistance This NT walked besided Pt. Pt only reported side pain while walkiing but no other pain, or dizziness.

## 2022-09-19 NOTE — ED Provider Notes (Signed)
[facility]   CSN: [id number] Arrival date & time: 09/19/22  1647     History  Chief Complaint  Patient presents with   Kathleen Gibson    Icey M Tutterow is a 72 y.o. female.  With PMH of HTN, HLD, DM, [name] on chemotherapy who presents with rib pain, head injury after mechanical fall tripping up stairs today.  Patient was walking up the 4 brick steps into her daughter's house when she tripped and landed onto her left-sided ribs and head.  She did not lose consciousness but she was unable to stand after the fall and called fire who picked her up as well as EMS and brought her to the hospital.  She had no preceding symptoms leading to fall, no chest pain shortness of breath palpitations or lightheadedness.  She had no syncope.  She is complaining of pain in her left upper chest wall and left posterior ribs.  It is worse with breathing.  She also has some mid abdominal and left upper quadrant pain.  She is not on any blood thinners but has history of low platelets due to chemotherapy.  Her last chemotherapy was this past Friday.  She has no focal weakness numbness or tingling.  No pain in her legs or arms.   Fall       Home Medications Prior to Admission medications   Medication Sig Start Date End Date Taking? Authorizing Provider  lidocaine (HM LIDOCAINE PATCH) 4 % Place 2 patches onto the skin daily. 09/19/22  Yes Mardene SayerBranham, Josette Shimabukuro C, MD  cyanocobalamin (,VITAMIN B-12,) 1000 MCG/ML injection Inject 1,000 mcg into the muscle every 30 (thirty) days. Patient not taking: Reported on 09/12/2022    [provider]  cyclobenzaprine (FLEXERIL) 5 MG tablet Take 1 tablet (5 mg total) by mouth 3 (three) times daily as needed for muscle spasms. 12/06/21   Malachy MoodFeng, Yan, MD  furosemide (LASIX) 20 MG tablet TAKE 1 TABLET BY MOUTH EVERY DAY FOR UP TO 7 DAYS THEN AS NEEDED FOR LEG EDEMA 03/22/22   Malachy MoodFeng, Yan, MD  glucose blood [facility](ONETOUCH VERIO) test strip  Monitor blood sugars twice daily Patient not taking: Reported on 09/12/2022 09/08/21   Kuneff, Renee A, DO  KLOR-CON M10 10 MEQ tablet TAKE 1 TABLET BY MOUTH TWICE A DAY TAKE ALONG WITH FUROSEMIDE ONLY 03/22/22   Malachy MoodFeng, Yan, MD  Lancets [facility](ONETOUCH ULTRASOFT) lancets Monitor blood sugars twice daily Patient not taking: Reported on 09/12/2022 09/08/21   Claiborne BillingsKuneff, Renee A, DO  lidocaine-prilocaine (EMLA) cream APPLY 2 GRAMS TO PORT-A-CATH SITE 30-60 MINUTES PRIOR TO PORT ACCESS AS NEEDED 06/19/22   Malachy MoodFeng, Yan, MD  metoprolol succinate (TOPROL-XL) 25 MG 24 hr tablet Take 1 tablet (25 mg total) by mouth daily. 08/12/22   Kuneff, Renee A, DO  naloxone (NARCAN) nasal spray 4 mg/0.1 mL Place 1 spray into the nose once as needed (overdose). 03/12/21   [name], Werner LeanShanker M, MD  omeprazole (PRILOSEC) 20 MG capsule Take 20 mg by mouth daily. 08/15/22   [provider]  ondansetron (ZOFRAN-ODT) 4 MG disintegrating tablet Take 1 tablet (4 mg total) by mouth every 8 (eight) hours as needed for nausea or vomiting. 02/28/22   Malachy MoodFeng, Yan, MD  Oxycodone HCl 10 MG TABS Take 1 tablet (10 mg total) by mouth 2 (two) times daily as needed. 08/12/22   Kuneff, Renee A, DO  Oxycodone HCl 10 MG TABS Take 1 tablet (10 mg total) by mouth 2 (  two) times daily as needed. 08/12/22   Kuneff, Renee A, DO  Oxycodone HCl 10 MG TABS Take 1 tablet (10 mg total) by mouth 2 (two) times daily as needed. 08/12/22   Kuneff, Renee A, DO  polyethylene glycol powder (GLYCOLAX/MIRALAX) 17 GM/SCOOP powder Dissolve 1 capful (17 g) in water and drink 2 (two) times daily. Patient taking differently: Take 17 g by mouth daily. 03/26/21   Kuneff, Renee A, DO  pregabalin (LYRICA) 150 MG capsule Take 1 capsule (150 mg total) by mouth 2 (two) times daily. 08/12/22   Kuneff, Renee A, DO  prochlorperazine (COMPAZINE) 10 MG tablet Take 10 mg by mouth every 6 (six) hours as needed. 08/15/22   [provider]  promethazine-dextromethorphan (PROMETHAZINE-DM) 6.25-15 MG/5ML  syrup Take 5 mLs by mouth 4 (four) times daily as needed. Patient not taking: Reported on 09/12/2022 05/16/22   Zola Button, Grayling Congress, DO  senna-docusate (SENOKOT-S) 8.6-50 MG tablet Take 2 tablets by mouth at bedtime as needed for mild constipation. 06/20/22   Malachy Mood, MD  sitaGLIPtin-metformin (JANUMET) 50-1000 MG tablet Take 1 tablet by mouth at bedtime. 08/12/22   Kuneff, Renee A, DO      Allergies    Asa [aspirin], Nsaids, and Red blood cells    Review of Systems   Review of Systems  Physical Exam Updated Vital Signs BP (!) 126/59   Pulse 71   Temp 99.3 F (37.4 C) (Oral)   Resp 17   SpO2 96%  Physical Exam Constitutional: Alert and oriented.  Nontoxic, NAD Eyes: Conjunctivae are normal. PERRL ENT      Head: Normocephalic and small left frontal forehead half centimeter superficial abrasion with slow oozing bleed.      Nose: No ttp.      Mouth/Throat: Mucous membranes are moist. No dental trauma      Neck/back: No stridor.  No midline tenderness step-off or deformity to C-spine, T or L-spine Cardiovascular: S1, S2,  Normal and symmetric distal pulses are present in all extremities.Warm and well perfused.  Left posterior mid chest wall tenderness to palpation with no external evidence of trauma as well as left mid lateral chest wall tenderness without external evidence of trauma, no hematoma, ecchymoses, abrasions. Respiratory: Normal respiratory effort. Breath sounds are normal.  O2 sat 99 on RA Gastrointestinal: Soft and nondistended with mid abdominal tenderness , no rebound or guarding, not peritonitic Musculoskeletal: Normal range of motion in all extremities. No tenderness or deformity of bilateral upper or lower extremities.  No pain with passive range of motion of large joints. Neurologic: Normal speech and language.  No facial droop.  PERRL.  Equal strength bilateral upper and lower extremities.  Sensation grossly intact.  No gross focal neurologic deficits are  appreciated. Skin: Skin is warm, dry and intact. No rash noted. Psychiatric: Mood and affect are normal. Speech and behavior are normal.  ED Results / Procedures / Treatments   Labs (all labs ordered are listed, but only abnormal results are displayed) Labs Reviewed  BASIC METABOLIC PANEL - Abnormal; Notable for the following components:      Result Value   Potassium 3.3 (*)    Chloride 112 (*)    Calcium 7.3 (*)    All other components within normal limits  CBC WITH DIFFERENTIAL/PLATELET - Abnormal; Notable for the following components:   RBC 3.02 (*)    Hemoglobin 8.9 (*)    HCT 28.1 (*)    RDW 22.5 (*)    Platelets 70 (*)  Lymphs Abs 0.6 (*)    All other components within normal limits  PROTIME-INR    EKG None  Radiology CT CHEST ABDOMEN PELVIS W CONTRAST  Result Date: 09/19/2022 CLINICAL DATA:  72 year old female with left rib, mid abdominal, left upper quadrant pain after fall EXAM: CT CHEST, ABDOMEN, AND PELVIS WITH CONTRAST TECHNIQUE: Multidetector CT imaging of the chest, abdomen and pelvis was performed following the standard protocol during bolus administration of intravenous contrast. RADIATION DOSE REDUCTION: This exam was performed according to the departmental dose-optimization program which includes automated exposure control, adjustment of the mA and/or kV according to patient size and/or use of iterative reconstruction technique. CONTRAST:  75mL OMNIPAQUE IOHEXOL 350 MG/ML SOLN COMPARISON:  Chest radiographs 05/14/2022; CT chest abdomen and pelvis 10/29/2020 FINDINGS: CT CHEST FINDINGS Cardiovascular: Normal heart size. No pericardial effusion. Coronary artery and aortic atherosclerotic calcification. Right chest wall Port-A-Cath tip in the right atrium. Mediastinum/Nodes: Small hiatal hernia. No thoracic adenopathy by size. Lungs/Pleura: Few small patchy ground-glass opacities in the lingula, likely infectious/inflammatory. Right middle lobe  atelectasis/consolidation. No pleural effusion or pneumothorax. Musculoskeletal: Remote right rib fractures. T10 and T12 vertebroplasties. Mild superior endplate compression deformities of T7, T8, similar to thoracic spine MR 05/29/2021. No acute fracture. CT ABDOMEN PELVIS FINDINGS Hepatobiliary: Hepatomegaly, hepatic steatosis and mild nodularity of the hepatic contour suggestive of early cirrhosis. No focal hepatic lesion. Cholelithiasis. Gallbladder wall thickening favored due to decompression and cirrhosis. No biliary dilation. Pancreas: Fatty atrophy.  No acute abnormality. Spleen: Splenomegaly measuring 18.6 cm in craniocaudal dimension. 5.9 x 4.8 cm region of hypoenhancement within the posterior spleen suggestive of infarct. No evidence of active hemorrhage on single phase exam. There is a small amount of low-density perihepatic and perisplenic ascites. Adrenals/Urinary Tract: Unremarkable. No urinary calculi or hydronephrosis. Unremarkable bladder. Stomach/Bowel: Normal caliber large and small bowel. Colonic diverticulosis without diverticulitis. Normal appendix. Postoperative stent changes of gastric bypass. Vascular/Lymphatic: Subcentimeter mesenteric and upper retroperitoneal lymph nodes have decreased in size since 10/29/2020. The largest is a 9 mm left periaortic node (3/69). Aortic atherosclerotic calcification. Reproductive: Uterus and bilateral adnexa are unremarkable. Other: Low-density perisplenic, perihepatic, and pelvic free fluid. No free intraperitoneal air. Musculoskeletal: Superior endplate compression fractures of L1 and L2 are chronic and similar to 05/29/2022. Superior endplate compression fracture of L4 is presumed chronic though age indeterminate without comparison. IMPRESSION: 1. Splenomegaly with 5.9 cm region of hypoenhancement suggestive of infarct. No evidence of active hemorrhage on single phase exam. 2. Hepatomegaly and mild nodularity of the hepatic contour suggestive of early  cirrhosis. Small volume abdominopelvic ascites. 3. Cholelithiasis. Gallbladder wall thickening favored due to decompression and cirrhosis. 4. Superior endplate compression fracture of L4 is presumed chronic though age indeterminate without comparison. Chronic superior endplate compression fractures of T7, T8, L1, and L2. Aortic Atherosclerosis (ICD10-I70.0). Electronically Signed   By: Minerva Fester M.D.   On: 09/19/2022 19:28   CT Head Wo Contrast  Result Date: 09/19/2022 CLINICAL DATA:  Fall with thrombocytopenia EXAM: CT HEAD WITHOUT CONTRAST CT CERVICAL SPINE WITHOUT CONTRAST TECHNIQUE: Multidetector CT imaging of the head and cervical spine was performed following the standard protocol without intravenous contrast. Multiplanar CT image reconstructions of the cervical spine were also generated. RADIATION DOSE REDUCTION: This exam was performed according to the departmental dose-optimization program which includes automated exposure control, adjustment of the mA and/or kV according to patient size and/or use of iterative reconstruction technique. COMPARISON:  None Available. FINDINGS: CT HEAD FINDINGS Brain: No evidence of acute infarction, hemorrhage,  hydrocephalus, extra-axial collection or mass lesion/mass effect. Vascular: No hyperdense vessels. Carotid and vertebral calcification Skull: Normal. Negative for fracture or focal lesion. Sinuses/Orbits: No acute finding. Other: None CT CERVICAL SPINE FINDINGS Alignment: Normal alignment.  Facet alignment within normal limits Skull base and vertebrae: No fracture. Heterogeneous mineralization throughout. Soft tissues and spinal canal: No prevertebral fluid or swelling. No visible canal hematoma. Disc levels: Multilevel degenerative change. Moderate disc space narrowing C4 through C7. Upper chest: Negative. Other: None IMPRESSION: 1. Negative non contrasted CT appearance of the brain. 2. Degenerative changes of the cervical spine. No acute osseous abnormality.  Heterogeneous mineralization presumably related to the history of known marrow disease. Electronically Signed   By: Jasmine Pang M.D.   On: 09/19/2022 18:58   CT Cervical Spine Wo Contrast  Result Date: 09/19/2022 CLINICAL DATA:  Fall with thrombocytopenia EXAM: CT HEAD WITHOUT CONTRAST CT CERVICAL SPINE WITHOUT CONTRAST TECHNIQUE: Multidetector CT imaging of the head and cervical spine was performed following the standard protocol without intravenous contrast. Multiplanar CT image reconstructions of the cervical spine were also generated. RADIATION DOSE REDUCTION: This exam was performed according to the departmental dose-optimization program which includes automated exposure control, adjustment of the mA and/or kV according to patient size and/or use of iterative reconstruction technique. COMPARISON:  None Available. FINDINGS: CT HEAD FINDINGS Brain: No evidence of acute infarction, hemorrhage, hydrocephalus, extra-axial collection or mass lesion/mass effect. Vascular: No hyperdense vessels. Carotid and vertebral calcification Skull: Normal. Negative for fracture or focal lesion. Sinuses/Orbits: No acute finding. Other: None CT CERVICAL SPINE FINDINGS Alignment: Normal alignment.  Facet alignment within normal limits Skull base and vertebrae: No fracture. Heterogeneous mineralization throughout. Soft tissues and spinal canal: No prevertebral fluid or swelling. No visible canal hematoma. Disc levels: Multilevel degenerative change. Moderate disc space narrowing C4 through C7. Upper chest: Negative. Other: None IMPRESSION: 1. Negative non contrasted CT appearance of the brain. 2. Degenerative changes of the cervical spine. No acute osseous abnormality. Heterogeneous mineralization presumably related to the history of known marrow disease. Electronically Signed   By: Jasmine Pang M.D.   On: 09/19/2022 18:58    Procedures Procedures    Medications Ordered in ED Medications  morphine (PF) 4 MG/ML injection  4 mg (4 mg Intravenous Given 09/19/22 1829)  ondansetron (ZOFRAN) injection 4 mg (4 mg Intravenous Given 09/19/22 1829)  acetaminophen (TYLENOL) tablet 1,000 mg (1,000 mg Oral Given 09/19/22 1829)  Tdap (BOOSTRIX) injection 0.5 mL (0.5 mLs Intramuscular Given 09/19/22 1834)  bacitracin ointment (1 Application Topical Given 09/19/22 1833)  iohexol (OMNIPAQUE) 350 MG/ML injection 75 mL (75 mLs Intravenous Contrast Given 09/19/22 1824)  potassium chloride (KLOR-CON) packet 40 mEq (40 mEq Oral Given 09/19/22 2005)    ED Course/ Medical Decision Making/ A&P Clinical Course as of 09/19/22 2132  Mon Sep 19, 2022  2112 Platelets(!): 70 [VB]  2127 Patient has ambulated without difficulty.  She is requesting to go home.  We did discuss negative traumatic injuries on her scans today.  Did discuss incidental findings of hepatosplenomegaly and splenic infarct.  I did talk to on-call hematology Dr Candise Che, recommends patient following up with her oncologist regarding splenic infarct.  Especially with thrombocytopenia would not recommend going on blood thinners at this time.  Patient will discuss with her oncologist.  She is being discharged in stable condition.  She will continue taking home meds. Return precautions discussed. [VB]    Clinical Course User Index [VB] Mardene Sayer, MD  Medical Decision Making Kathleen Gibson is a 72 y.o. female.  With PMH of HTN, HLD, DM, [name] on chemotherapy who presents with rib pain, head injury after mechanical fall tripping up stairs today.   Primary trauma survey intact.  Patient did have mechanical fall with no presyncopal symptoms and no syncope.  She did have small head abrasion but no loss of consciousness.  Secondary trauma survey notable for left forehead abrasion as well as left posterior and lateral chest wall tenderness without external evidence of trauma and mild mid abdominal tenderness.   Labs obtained notable for chronic anemia hemoglobin  8.9 and chronic thrombocytopenia 70 likely related to [name] and chemotherapy.  Mild hypokalemia 3.3 repleted here orally.  Creatinine 0.94 within normal limits.  CT head obtained which I personally reviewed no ICH no acute traumatic findings.  CT C-spine without acute traumatic findings.  CT chest abdomen pelvis with contrast obtained with no acute traumatic injuries.  There was evidence of hepatosplenomegaly, early findings of cirrhosis, cholelithiasis without cholecystitis, chronic L4, T7-T8 L1-L2 fractures.    Patient has no midline tenderness of the spine consistent with acute spinal fractures.  Patient has ambulated without difficulty.  She is requesting to go home.  We did discuss negative traumatic injuries on her scans today.  Did discuss incidental findings of hepatosplenomegaly and splenic infarct.  I did talk to on-call hematology Dr Candise Che, recommends patient following up with her oncologist regarding splenic infarct.  Especially with thrombocytopenia would not recommend going on blood thinners at this time.  Patient will discuss with her oncologist.  She is being discharged in stable condition.  She will continue taking home meds. Return precautions discussed. [VB]  Amount and/or Complexity of Data Reviewed Labs: ordered. Decision-making details documented in ED Course. Radiology: ordered.  Risk OTC drugs. Prescription drug management.      Final Clinical Impression(s) / ED Diagnoses Final diagnoses:  Fall, initial encounter  Rib pain  Hepatosplenomegaly  Contusion of rib on left side, initial encounter  Splenic infarct    Rx / DC Orders ED Discharge Orders          Ordered    lidocaine (HM LIDOCAINE PATCH) 4 %  Every 24 hours        09/19/22 2126              Mardene Sayer, MD 09/19/22 2132

## 2022-09-19 NOTE — Discharge Instructions (Addendum)
You have been seen in the Emergency Department (ED) today following a fall.  Your workup today did not reveal any injuries that require you to stay in the hospital.  There were no rib fractures or new fractures or internal bleeding or injuries.  You can expect to be stiff and sore for the next several days.  Please take Tylenol or Motrin as needed for pain, but only as written on the box.  You can also take this with your home oxycodone and the lidocaine patches.  As we discussed, your CT scan did show incidental large spleen and liver which has been visualized on previous scans.  There also was an incidental finding of a small splenic infarct.  This could be chronic in nature.  We recommend you following up with your hematologist /oncologist for discussion of this finding.  With your low platelet levels, typically we do not recommend going on blood thinners.  Please follow up with your primary care doctor as soon as possible regarding today's ED visit and your recent accident.   Call your doctor or return to the ED if you develop a sudden or severe headache, confusion, slurred speech, facial droop, weakness or numbness in any arm or leg,  extreme fatigue, vomiting more than two times, severe abdominal pain, difficulty breathing or any other concerning signs or symptoms.

## 2022-09-19 NOTE — ED Notes (Signed)
Patient transported to CT 

## 2022-09-19 NOTE — ED Triage Notes (Signed)
Patient BIBA from daughter's house for mechanical fall. Patient tripped on the first of three brick steps and landed on her left rib area. Patient also has abrasion to left forehead but no complaints regarding that, only her left ribs. A&Ox4, no thinners, but currently receiving chemo. VSS, NAD.

## 2022-09-21 ENCOUNTER — Inpatient Hospital Stay: Payer: Medicare HMO

## 2022-09-21 VITALS — BP 143/72 | HR 85 | Temp 98.7°F | Resp 20

## 2022-09-21 DIAGNOSIS — D696 Thrombocytopenia, unspecified: Secondary | ICD-10-CM

## 2022-09-21 DIAGNOSIS — C9310 Chronic myelomonocytic leukemia not having achieved remission: Secondary | ICD-10-CM

## 2022-09-21 DIAGNOSIS — R519 Headache, unspecified: Secondary | ICD-10-CM | POA: Diagnosis not present

## 2022-09-21 DIAGNOSIS — Z95828 Presence of other vascular implants and grafts: Secondary | ICD-10-CM

## 2022-09-21 DIAGNOSIS — E1142 Type 2 diabetes mellitus with diabetic polyneuropathy: Secondary | ICD-10-CM | POA: Diagnosis not present

## 2022-09-21 DIAGNOSIS — Z5111 Encounter for antineoplastic chemotherapy: Secondary | ICD-10-CM | POA: Diagnosis not present

## 2022-09-21 DIAGNOSIS — D693 Immune thrombocytopenic purpura: Secondary | ICD-10-CM | POA: Diagnosis not present

## 2022-09-21 DIAGNOSIS — E538 Deficiency of other specified B group vitamins: Secondary | ICD-10-CM | POA: Diagnosis not present

## 2022-09-21 DIAGNOSIS — Z7984 Long term (current) use of oral hypoglycemic drugs: Secondary | ICD-10-CM | POA: Diagnosis not present

## 2022-09-21 DIAGNOSIS — I10 Essential (primary) hypertension: Secondary | ICD-10-CM | POA: Diagnosis not present

## 2022-09-21 DIAGNOSIS — Z7952 Long term (current) use of systemic steroids: Secondary | ICD-10-CM | POA: Diagnosis not present

## 2022-09-21 DIAGNOSIS — Z79899 Other long term (current) drug therapy: Secondary | ICD-10-CM | POA: Diagnosis not present

## 2022-09-21 DIAGNOSIS — Z9884 Bariatric surgery status: Secondary | ICD-10-CM | POA: Diagnosis not present

## 2022-09-21 MED ORDER — ROMIPLOSTIM INJECTION 500 MCG
550.0000 ug | Freq: Once | SUBCUTANEOUS | Status: AC
  Administered 2022-09-21: 550 ug via SUBCUTANEOUS
  Filled 2022-09-21: qty 1

## 2022-09-21 NOTE — Patient Instructions (Signed)
Romiplostim Injection What is this medication? ROMIPLOSTIM (roe mi PLOE stim) treats low levels of platelets in your body caused by immune thrombocytopenia (ITP). It is prescribed when other medications have not worked or cannot be tolerated. It may also be used to help people who have been exposed to high doses of radiation. It works by increasing the amount of platelets in your blood. This lowers the risk of bleeding. This medicine may be used for other purposes; ask your health care provider or pharmacist if you have questions. COMMON BRAND NAME(S): Nplate What should I tell my care team before I take this medication? They need to know if you have any of these conditions: Blood clots Myelodysplastic syndrome An unusual or allergic reaction to romiplostim, mannitol, other medications, foods, dyes, or preservatives Pregnant or trying to get pregnant Breast-feeding How should I use this medication? This medication is injected under the skin. It is given by a care team in a hospital or clinic setting. A special MedGuide will be given to you before each treatment. Be sure to read this information carefully each time. Talk to your care team about the use of this medication in children. While it may be prescribed for children as young as newborns for selected conditions, precautions do apply. Overdosage: If you think you have taken too much of this medicine contact a poison control center or emergency room at once. NOTE: This medicine is only for you. Do not share this medicine with others. What if I miss a dose? Keep appointments for follow-up doses. It is important not to miss your dose. Call your care team if you are unable to keep an appointment. What may interact with this medication? Interactions are not expected. This list may not describe all possible interactions. Give your health care provider a list of all the medicines, herbs, non-prescription drugs, or dietary supplements you use. Also  tell them if you smoke, drink alcohol, or use illegal drugs. Some items may interact with your medicine. What should I watch for while using this medication? Visit your care team for regular checks on your progress. You may need blood work done while you are taking this medication. Your condition will be monitored carefully while you are receiving this medication. It is important not to miss any appointments. What side effects may I notice from receiving this medication? Side effects that you should report to your care team as soon as possible: Allergic reactions--skin rash, itching, hives, swelling of the face, lips, tongue, or throat Blood clot--pain, swelling, or warmth in the leg, shortness of breath, chest pain Side effects that usually do not require medical attention (report to your care team if they continue or are bothersome): Dizziness Joint pain Muscle pain Pain in the hands or feet Stomach pain Trouble sleeping This list may not describe all possible side effects. Call your doctor for medical advice about side effects. You may report side effects to FDA at 1-800-FDA-1088. Where should I keep my medication? This medication is given in a hospital or clinic. It will not be stored at home. NOTE: This sheet is a summary. It may not cover all possible information. If you have questions about this medicine, talk to your doctor, pharmacist, or health care provider.  2023 Elsevier/Gold Standard (2021-09-07 00:00:00)  

## 2022-09-21 NOTE — Progress Notes (Signed)
Per Mosetta Putt, MD okay to use CBC results from 4/8 for Nplate injection

## 2022-09-22 ENCOUNTER — Telehealth: Payer: Self-pay

## 2022-09-22 NOTE — Transitions of Care (Post Inpatient/ED Visit) (Signed)
   09/22/2022  Name: Kathleen Gibson MRN: [id number] DOB: 01-Aug-1950  Today's TOC FU Call Status: Today's TOC FU Call Status:: Unsuccessul Call (1st Attempt) Unsuccessful Call (1st Attempt) Date: 09/22/22 [name] on EMMI-ED Discharge Alert Date & Reason: 09/21/22 "Scheduled follow-up appt? No")  Attempted to reach the patient regarding the most recent Inpatient/ED visit.  Follow Up Plan: Additional outreach attempts will be made to reach the patient to complete the Transitions of Care (Post Inpatient/ED visit) call.     Antionette Fairy, RN,BSN,CCM [facility] Care Management Care Management Community Coordinator Direct Phone: [phone] Toll Free: [phone] Fax: [phone]

## 2022-09-23 ENCOUNTER — Telehealth: Payer: Self-pay

## 2022-09-23 NOTE — Transitions of Care (Post Inpatient/ED Visit) (Signed)
   09/23/2022  Name: Kathleen Gibson MRN: [id number] DOB: [birth year]  Today's TOC FU Call Status: Today's TOC FU Call Status:: Unsuccessful Call (3rd Attempt) Unsuccessful Call (2nd Attempt) Date:  (Red on EMMI-ED Discharge Alert Date & Reason: 09/21/22 "Scheduled follow-up appt? No") Unsuccessful Call (3rd Attempt) Date: 09/23/22  Attempted to reach the patient regarding the most recent Inpatient/ED visit.  Follow Up Plan: No further outreach attempts will be made at this time. We have been unable to contact the patient.   Antionette Fairy, RN,BSN,CCM [facility] Care Management Care Management Community Coordinator Direct Phone: [phone] Toll Free: [phone] Fax: [phone]

## 2022-09-23 NOTE — Transitions of Care (Post Inpatient/ED Visit) (Signed)
   09/23/2022  Name: Kathleen Gibson MRN: [id number] DOB: 04-Jan-1951  Today's TOC FU Call Status: Today's TOC FU Call Status:: Unsuccessful Call (2nd Attempt) Unsuccessful Call (2nd Attempt) Date: 09/23/22 [name] on EMMI-ED Discharge Alert Date & Reason:  09/21/22 "Scheduled follow-up appt? No")  Attempted to reach the patient regarding the most recent Inpatient/ED visit.  Follow Up Plan: Additional outreach attempts will be made to reach the patient to complete the Transitions of Care (Post Inpatient/ED visit) call.     Antionette Fairy, RN,BSN,CCM [facility] Care Management Care Management Community Coordinator Direct Phone: [phone] Toll Free: [phone] Fax: [phone]

## 2022-09-28 ENCOUNTER — Inpatient Hospital Stay: Payer: Medicare HMO

## 2022-09-28 ENCOUNTER — Other Ambulatory Visit: Payer: Self-pay

## 2022-09-28 VITALS — BP 115/69 | HR 78 | Temp 98.4°F | Resp 18

## 2022-09-28 DIAGNOSIS — Z9884 Bariatric surgery status: Secondary | ICD-10-CM | POA: Diagnosis not present

## 2022-09-28 DIAGNOSIS — Z95828 Presence of other vascular implants and grafts: Secondary | ICD-10-CM

## 2022-09-28 DIAGNOSIS — E538 Deficiency of other specified B group vitamins: Secondary | ICD-10-CM | POA: Diagnosis not present

## 2022-09-28 DIAGNOSIS — Z7952 Long term (current) use of systemic steroids: Secondary | ICD-10-CM | POA: Diagnosis not present

## 2022-09-28 DIAGNOSIS — C9310 Chronic myelomonocytic leukemia not having achieved remission: Secondary | ICD-10-CM | POA: Diagnosis not present

## 2022-09-28 DIAGNOSIS — D696 Thrombocytopenia, unspecified: Secondary | ICD-10-CM

## 2022-09-28 DIAGNOSIS — Z79899 Other long term (current) drug therapy: Secondary | ICD-10-CM | POA: Diagnosis not present

## 2022-09-28 DIAGNOSIS — E1142 Type 2 diabetes mellitus with diabetic polyneuropathy: Secondary | ICD-10-CM | POA: Diagnosis not present

## 2022-09-28 DIAGNOSIS — D693 Immune thrombocytopenic purpura: Secondary | ICD-10-CM | POA: Diagnosis not present

## 2022-09-28 DIAGNOSIS — Z5111 Encounter for antineoplastic chemotherapy: Secondary | ICD-10-CM | POA: Diagnosis not present

## 2022-09-28 DIAGNOSIS — R519 Headache, unspecified: Secondary | ICD-10-CM | POA: Diagnosis not present

## 2022-09-28 DIAGNOSIS — I10 Essential (primary) hypertension: Secondary | ICD-10-CM | POA: Diagnosis not present

## 2022-09-28 DIAGNOSIS — Z7984 Long term (current) use of oral hypoglycemic drugs: Secondary | ICD-10-CM | POA: Diagnosis not present

## 2022-09-28 LAB — CBC WITH DIFFERENTIAL (CANCER CENTER ONLY)
Abs Immature Granulocytes: 0.13 10*3/uL — ABNORMAL HIGH (ref 0.00–0.07)
Basophils Absolute: 0.1 10*3/uL (ref 0.0–0.1)
Basophils Relative: 1 %
Eosinophils Absolute: 0.1 10*3/uL (ref 0.0–0.5)
Eosinophils Relative: 1 %
HCT: 30.8 % — ABNORMAL LOW (ref 36.0–46.0)
Hemoglobin: 10.2 g/dL — ABNORMAL LOW (ref 12.0–15.0)
Immature Granulocytes: 2 %
Lymphocytes Relative: 9 %
Lymphs Abs: 0.7 10*3/uL (ref 0.7–4.0)
MCH: 28.6 pg (ref 26.0–34.0)
MCHC: 33.1 g/dL (ref 30.0–36.0)
MCV: 86.3 fL (ref 80.0–100.0)
Monocytes Absolute: 1.5 10*3/uL — ABNORMAL HIGH (ref 0.1–1.0)
Monocytes Relative: 19 %
Neutro Abs: 5.3 10*3/uL (ref 1.7–7.7)
Neutrophils Relative %: 68 %
Platelet Count: 83 10*3/uL — ABNORMAL LOW (ref 150–400)
RBC: 3.57 MIL/uL — ABNORMAL LOW (ref 3.87–5.11)
RDW: 21.3 % — ABNORMAL HIGH (ref 11.5–15.5)
WBC Count: 7.8 10*3/uL (ref 4.0–10.5)
nRBC: 0 % (ref 0.0–0.2)

## 2022-09-28 LAB — SAMPLE TO BLOOD BANK

## 2022-09-28 LAB — COMPREHENSIVE METABOLIC PANEL
ALT: 20 U/L (ref 0–44)
AST: 22 U/L (ref 15–41)
Albumin: 3.3 g/dL — ABNORMAL LOW (ref 3.5–5.0)
Alkaline Phosphatase: 120 U/L (ref 38–126)
Anion gap: 5 (ref 5–15)
BUN: 14 mg/dL (ref 8–23)
CO2: 26 mmol/L (ref 22–32)
Calcium: 8.7 mg/dL — ABNORMAL LOW (ref 8.9–10.3)
Chloride: 111 mmol/L (ref 98–111)
Creatinine, Ser: 0.63 mg/dL (ref 0.44–1.00)
GFR, Estimated: >60 mL/min (ref >60)
Glucose, Bld: 177 mg/dL — ABNORMAL HIGH (ref 70–99)
Potassium: 3.9 mmol/L (ref 3.5–5.1)
Sodium: 142 mmol/L (ref 135–145)
Total Bilirubin: 1.2 mg/dL (ref 0.3–1.2)
Total Protein: 6.5 g/dL (ref 6.5–8.1)

## 2022-09-28 MED ORDER — SODIUM CHLORIDE 0.9% FLUSH
10.0000 mL | Freq: Once | INTRAVENOUS | Status: AC
  Administered 2022-09-28: 10 mL

## 2022-09-28 MED ORDER — ROMIPLOSTIM INJECTION 500 MCG
550.0000 ug | Freq: Once | SUBCUTANEOUS | Status: AC
  Administered 2022-09-28: 550 ug via SUBCUTANEOUS
  Filled 2022-09-28: qty 1

## 2022-09-28 MED ORDER — HEPARIN SOD (PORK) LOCK FLUSH 100 UNIT/ML IV SOLN
500.0000 [IU] | Freq: Once | INTRAVENOUS | Status: AC
  Administered 2022-09-28: 500 [IU]

## 2022-10-05 ENCOUNTER — Other Ambulatory Visit: Payer: Self-pay

## 2022-10-05 ENCOUNTER — Inpatient Hospital Stay: Payer: Medicare HMO

## 2022-10-05 VITALS — BP 103/63 | HR 68 | Temp 98.5°F | Resp 16

## 2022-10-05 DIAGNOSIS — Z79899 Other long term (current) drug therapy: Secondary | ICD-10-CM | POA: Diagnosis not present

## 2022-10-05 DIAGNOSIS — Z9884 Bariatric surgery status: Secondary | ICD-10-CM | POA: Diagnosis not present

## 2022-10-05 DIAGNOSIS — Z7984 Long term (current) use of oral hypoglycemic drugs: Secondary | ICD-10-CM | POA: Diagnosis not present

## 2022-10-05 DIAGNOSIS — E538 Deficiency of other specified B group vitamins: Secondary | ICD-10-CM | POA: Diagnosis not present

## 2022-10-05 DIAGNOSIS — D693 Immune thrombocytopenic purpura: Secondary | ICD-10-CM | POA: Diagnosis not present

## 2022-10-05 DIAGNOSIS — D696 Thrombocytopenia, unspecified: Secondary | ICD-10-CM

## 2022-10-05 DIAGNOSIS — Z5111 Encounter for antineoplastic chemotherapy: Secondary | ICD-10-CM | POA: Diagnosis not present

## 2022-10-05 DIAGNOSIS — R519 Headache, unspecified: Secondary | ICD-10-CM | POA: Diagnosis not present

## 2022-10-05 DIAGNOSIS — Z95828 Presence of other vascular implants and grafts: Secondary | ICD-10-CM

## 2022-10-05 DIAGNOSIS — E1142 Type 2 diabetes mellitus with diabetic polyneuropathy: Secondary | ICD-10-CM | POA: Diagnosis not present

## 2022-10-05 DIAGNOSIS — C9310 Chronic myelomonocytic leukemia not having achieved remission: Secondary | ICD-10-CM

## 2022-10-05 DIAGNOSIS — Z7952 Long term (current) use of systemic steroids: Secondary | ICD-10-CM | POA: Diagnosis not present

## 2022-10-05 DIAGNOSIS — I10 Essential (primary) hypertension: Secondary | ICD-10-CM | POA: Diagnosis not present

## 2022-10-05 LAB — CBC WITH DIFFERENTIAL (CANCER CENTER ONLY)
Abs Immature Granulocytes: 0.04 10*3/uL (ref 0.00–0.07)
Basophils Absolute: 0 10*3/uL (ref 0.0–0.1)
Basophils Relative: 1 %
Eosinophils Absolute: 0 10*3/uL (ref 0.0–0.5)
Eosinophils Relative: 1 %
HCT: 29.1 % — ABNORMAL LOW (ref 36.0–46.0)
Hemoglobin: 9.4 g/dL — ABNORMAL LOW (ref 12.0–15.0)
Immature Granulocytes: 1 %
Lymphocytes Relative: 15 %
Lymphs Abs: 0.7 10*3/uL (ref 0.7–4.0)
MCH: 28.2 pg (ref 26.0–34.0)
MCHC: 32.3 g/dL (ref 30.0–36.0)
MCV: 87.4 fL (ref 80.0–100.0)
Monocytes Absolute: 1.1 10*3/uL — ABNORMAL HIGH (ref 0.1–1.0)
Monocytes Relative: 23 %
Neutro Abs: 2.8 10*3/uL (ref 1.7–7.7)
Neutrophils Relative %: 59 %
Platelet Count: 71 10*3/uL — ABNORMAL LOW (ref 150–400)
RBC: 3.33 MIL/uL — ABNORMAL LOW (ref 3.87–5.11)
RDW: 21.7 % — ABNORMAL HIGH (ref 11.5–15.5)
WBC Count: 4.6 10*3/uL (ref 4.0–10.5)
nRBC: 0 % (ref 0.0–0.2)

## 2022-10-05 LAB — SAMPLE TO BLOOD BANK

## 2022-10-05 MED ORDER — SODIUM CHLORIDE 0.9% FLUSH
10.0000 mL | Freq: Once | INTRAVENOUS | Status: AC
  Administered 2022-10-05: 10 mL

## 2022-10-05 MED ORDER — ROMIPLOSTIM INJECTION 500 MCG
550.0000 ug | Freq: Once | SUBCUTANEOUS | Status: AC
  Administered 2022-10-05: 550 ug via SUBCUTANEOUS
  Filled 2022-10-05: qty 1

## 2022-10-05 MED ORDER — HEPARIN SOD (PORK) LOCK FLUSH 100 UNIT/ML IV SOLN
500.0000 [IU] | Freq: Once | INTRAVENOUS | Status: AC
  Administered 2022-10-05: 500 [IU]

## 2022-10-07 MED FILL — Dexamethasone Sodium Phosphate Inj 100 MG/10ML: INTRAMUSCULAR | Qty: 1 | Status: AC

## 2022-10-09 NOTE — Assessment & Plan Note (Signed)
-  diagnosed in 10/2020, presented with severe thrombocytopenia  --She began azacitidine daily days 1-5 q. 28 days on 12/28/20 -She had worsening thrombocytopenia after stopping Nplate, restarted  -Bone marrow biopsy 03/18/21 showed stable disease, no increase in blasts. -she is tolerating treatment well, blood counts are stable  -Dr. Sharyne Richters checked her iron study last week and it was slightly low, he reccommended iv iron -Given her previous multiple blood transfusions, I will give 1 dose IV Venofer 400 mg -Continue weekly lab and Nplate injection -Zometa has been held due to her dental issue, I again encouraged her to have dental appointment.

## 2022-10-10 ENCOUNTER — Inpatient Hospital Stay (HOSPITAL_BASED_OUTPATIENT_CLINIC_OR_DEPARTMENT_OTHER): Payer: Medicare HMO | Admitting: Hematology

## 2022-10-10 ENCOUNTER — Inpatient Hospital Stay: Payer: Medicare HMO

## 2022-10-10 ENCOUNTER — Encounter: Payer: Self-pay | Admitting: Hematology

## 2022-10-10 VITALS — BP 115/59 | HR 73 | Resp 18

## 2022-10-10 VITALS — BP 109/58 | HR 76 | Temp 98.5°F | Resp 16 | Ht 63.0 in | Wt 148.8 lb

## 2022-10-10 DIAGNOSIS — C9310 Chronic myelomonocytic leukemia not having achieved remission: Secondary | ICD-10-CM

## 2022-10-10 DIAGNOSIS — Z9884 Bariatric surgery status: Secondary | ICD-10-CM | POA: Diagnosis not present

## 2022-10-10 DIAGNOSIS — Z5111 Encounter for antineoplastic chemotherapy: Secondary | ICD-10-CM | POA: Diagnosis not present

## 2022-10-10 DIAGNOSIS — E538 Deficiency of other specified B group vitamins: Secondary | ICD-10-CM

## 2022-10-10 DIAGNOSIS — D693 Immune thrombocytopenic purpura: Secondary | ICD-10-CM | POA: Diagnosis not present

## 2022-10-10 DIAGNOSIS — D696 Thrombocytopenia, unspecified: Secondary | ICD-10-CM

## 2022-10-10 DIAGNOSIS — Z7952 Long term (current) use of systemic steroids: Secondary | ICD-10-CM | POA: Diagnosis not present

## 2022-10-10 DIAGNOSIS — I10 Essential (primary) hypertension: Secondary | ICD-10-CM | POA: Diagnosis not present

## 2022-10-10 DIAGNOSIS — Z7984 Long term (current) use of oral hypoglycemic drugs: Secondary | ICD-10-CM | POA: Diagnosis not present

## 2022-10-10 DIAGNOSIS — E1142 Type 2 diabetes mellitus with diabetic polyneuropathy: Secondary | ICD-10-CM | POA: Diagnosis not present

## 2022-10-10 DIAGNOSIS — Z79899 Other long term (current) drug therapy: Secondary | ICD-10-CM | POA: Diagnosis not present

## 2022-10-10 DIAGNOSIS — R519 Headache, unspecified: Secondary | ICD-10-CM | POA: Diagnosis not present

## 2022-10-10 DIAGNOSIS — Z95828 Presence of other vascular implants and grafts: Secondary | ICD-10-CM

## 2022-10-10 LAB — SAMPLE TO BLOOD BANK

## 2022-10-10 LAB — CBC WITH DIFFERENTIAL (CANCER CENTER ONLY)
Abs Immature Granulocytes: 0.04 10*3/uL (ref 0.00–0.07)
Basophils Absolute: 0.1 10*3/uL (ref 0.0–0.1)
Basophils Relative: 1 %
Eosinophils Absolute: 0 10*3/uL (ref 0.0–0.5)
Eosinophils Relative: 0 %
HCT: 28.5 % — ABNORMAL LOW (ref 36.0–46.0)
Hemoglobin: 9.5 g/dL — ABNORMAL LOW (ref 12.0–15.0)
Immature Granulocytes: 1 %
Lymphocytes Relative: 14 %
Lymphs Abs: 0.5 10*3/uL — ABNORMAL LOW (ref 0.7–4.0)
MCH: 28.9 pg (ref 26.0–34.0)
MCHC: 33.3 g/dL (ref 30.0–36.0)
MCV: 86.6 fL (ref 80.0–100.0)
Monocytes Absolute: 0.9 10*3/uL (ref 0.1–1.0)
Monocytes Relative: 25 %
Neutro Abs: 2.2 10*3/uL (ref 1.7–7.7)
Neutrophils Relative %: 59 %
Platelet Count: 40 10*3/uL — ABNORMAL LOW (ref 150–400)
RBC: 3.29 MIL/uL — ABNORMAL LOW (ref 3.87–5.11)
RDW: 21.3 % — ABNORMAL HIGH (ref 11.5–15.5)
WBC Count: 3.7 10*3/uL — ABNORMAL LOW (ref 4.0–10.5)
nRBC: 0 % (ref 0.0–0.2)

## 2022-10-10 LAB — VITAMIN B12: Vitamin B-12: 1907 pg/mL — ABNORMAL HIGH (ref 180–914)

## 2022-10-10 LAB — BASIC METABOLIC PANEL - CANCER CENTER ONLY
Anion gap: 6 (ref 5–15)
BUN: 13 mg/dL (ref 8–23)
CO2: 26 mmol/L (ref 22–32)
Calcium: 8.2 mg/dL — ABNORMAL LOW (ref 8.9–10.3)
Chloride: 111 mmol/L (ref 98–111)
Creatinine: 0.62 mg/dL (ref 0.44–1.00)
GFR, Estimated: >60 mL/min (ref >60)
Glucose, Bld: 119 mg/dL — ABNORMAL HIGH (ref 70–99)
Potassium: 3.9 mmol/L (ref 3.5–5.1)
Sodium: 143 mmol/L (ref 135–145)

## 2022-10-10 MED ORDER — SODIUM CHLORIDE 0.9% FLUSH
10.0000 mL | Freq: Once | INTRAVENOUS | Status: AC | PRN
  Administered 2022-10-10: 10 mL

## 2022-10-10 MED ORDER — SODIUM CHLORIDE 0.9 % IV SOLN: Freq: Once | INTRAVENOUS | Status: AC

## 2022-10-10 MED ORDER — HEPARIN SOD (PORK) LOCK FLUSH 100 UNIT/ML IV SOLN
500.0000 [IU] | Freq: Once | INTRAVENOUS | Status: AC | PRN
  Administered 2022-10-10: 500 [IU]

## 2022-10-10 MED ORDER — SODIUM CHLORIDE 0.9 % IV SOLN
10.0000 mg | Freq: Once | INTRAVENOUS | Status: AC
  Administered 2022-10-10: 10 mg via INTRAVENOUS
  Filled 2022-10-10: qty 10

## 2022-10-10 MED ORDER — SODIUM CHLORIDE 0.9% FLUSH
10.0000 mL | INTRAVENOUS | Status: DC | PRN
  Administered 2022-10-10: 10 mL

## 2022-10-10 MED ORDER — SODIUM CHLORIDE 0.9 % IV SOLN
50.0000 mg/m2 | Freq: Once | INTRAVENOUS | Status: AC
  Administered 2022-10-10: 87 mg via INTRAVENOUS
  Filled 2022-10-10: qty 8.7

## 2022-10-10 MED FILL — Dexamethasone Sodium Phosphate Inj 100 MG/10ML: INTRAMUSCULAR | Qty: 1 | Status: AC

## 2022-10-10 NOTE — Patient Instructions (Signed)
[facility]  Discharge Instructions: Thank you for choosing [facility] to provide your oncology and hematology care.   If you have a lab appointment with the Cancer Center, please go directly to the Cancer Center and check in at the registration area.   Wear comfortable clothing and clothing appropriate for easy access to any Portacath or PICC line.   We strive to give you quality time with your provider. You may need to reschedule your appointment if you arrive late (15 or more minutes).  Arriving late affects you and other patients whose appointments are after yours.  Also, if you miss three or more appointments without notifying the office, you may be dismissed from the clinic at the provider's discretion.      For prescription refill requests, have your pharmacy contact our office and allow 72 hours for refills to be completed.    Today you received the following chemotherapy and/or immunotherapy agents: Vidaza     To help prevent nausea and vomiting after your treatment, we encourage you to take your nausea medication as directed.  BELOW ARE SYMPTOMS THAT SHOULD BE REPORTED IMMEDIATELY: *FEVER GREATER THAN 100.4 F (38 C) OR HIGHER *CHILLS OR SWEATING *NAUSEA AND VOMITING THAT IS NOT CONTROLLED WITH YOUR NAUSEA MEDICATION *UNUSUAL SHORTNESS OF BREATH *UNUSUAL BRUISING OR BLEEDING *URINARY PROBLEMS (pain or burning when urinating, or frequent urination) *BOWEL PROBLEMS (unusual diarrhea, constipation, pain near the anus) TENDERNESS IN MOUTH AND THROAT WITH OR WITHOUT PRESENCE OF ULCERS (sore throat, sores in mouth, or a toothache) UNUSUAL RASH, SWELLING OR PAIN  UNUSUAL VAGINAL DISCHARGE OR ITCHING   Items with * indicate a potential emergency and should be followed up as soon as possible or go to the Emergency Department if any problems should occur.  Please show the CHEMOTHERAPY ALERT CARD or IMMUNOTHERAPY ALERT CARD at check-in  to the Emergency Department and triage nurse.  Should you have questions after your visit or need to cancel or reschedule your appointment, please contact [facility]  Dept: [phone]  and follow the prompts.  Office hours are 8:00 a.m. to 4:30 p.m. Monday - Friday. Please note that voicemails left after 4:00 p.m. may not be returned until the following business day.  We are closed weekends and major holidays. You have access to a nurse at all times for urgent questions. Please call the main number to the clinic Dept: [phone] and follow the prompts.   For any non-urgent questions, you may also contact your provider using MyChart. We now offer e-Visits for anyone 18 and older to request care online for non-urgent symptoms. For details visit [email or web address].   Also download the MyChart app! Go to the app store, search "MyChart", open the app, select , and log in with your MyChart username and password.  

## 2022-10-10 NOTE — Progress Notes (Signed)
Patient seen by Dr. America Brown are within treatment parameters.  Labs reviewed: and are not all within treatment parameters. Platelet 40  Per physician team, patient is ready for treatment. Please note that modifications are being made to the treatment plan including Vidaza at reduce dose.

## 2022-10-10 NOTE — Progress Notes (Signed)
[facility]   Telephone:(336) [phone] [phone]) [phone]   Clinic Follow up Note   Patient Care Team: Natalia Leatherwood, DO as PCP - General (Family Medicine) Myrtie Neither Andreas Blower, MD as Consulting Physician (Gastroenterology) Malachy Mood, MD as Consulting Physician (Hematology) Glendale Chard, DO as Consulting Physician (Neurology)  Date of Service:  10/10/2022  CHIEF COMPLAINT: f/u of [name] and severe thrombocytopenia   CURRENT THERAPY:  Azacitidine days 1-5 q28 days, started 12/28/20 -Nplate 69mcg/kg weekly -Platelet transfusion as needed (plt<15K), blood transfusion if Hg<8.0   ASSESSMENT:  Kathleen Gibson is a 72 y.o. female with   [name] (chronic myelomonocytic leukemia) (HCC) -diagnosed in 10/2020, presented with severe thrombocytopenia  --She began azacitidine daily days 1-5 q. 28 days on 12/28/20 -She had worsening thrombocytopenia after stopping Nplate, restarted  -Bone marrow biopsy 03/18/21 showed stable disease, no increase in blasts. -she is tolerating treatment well, blood counts are stable  -Dr. Sharyne Richters checked her iron study last week and it was slightly low, he reccommended iv iron -Given her previous multiple blood transfusions, I will give 1 dose IV Venofer 400 mg -Continue weekly lab and Nplate injection -Zometa has been held due to her dental issue, I again encouraged her to have dental appointment.  -Lab reviewed, her platelet count is 40 today, no active bleeding.  She is scheduled to leave this Saturday to [place], I will proceed chemotherapy today with dose reduction of azacitidine to 60 mg/m -If she has persistent thrombocytopenia, I may get a bone marrow biopsy in the near future.  Anemia and thrombocytopenia  -Secondary to her [name] and chemotherapy -Platelet counts lower than her usual level, will watch closely -Continue Nplate injection weekly.  Recent fall -I reviewed her ED visit course, and reviewed her CT scan images. -Encouraged  her to have a cane when she goes out.  PLAN: -lab reviewed -platelet count 40 - Discuss CT scan from the hospital. -proceed with C1 Vidaza due to reduce dose due to low platelet count -Continue weekly lab and Nplate injection.  She will have this done in [place] for next 2 weeks -Lab and f/u 5/22 -Will repeat CBC this Friday to see if she needs plt transfusion     SUMMARY OF ONCOLOGIC HISTORY: Oncology History  [name] (chronic myelomonocytic leukemia) (HCC)  10/29/2020 Imaging   CT CAP  IMPRESSION: 1. Splenomegaly with pathologically enlarged lymph nodes above and below the diaphragm, with overall stable to minimally increased abdominal adenopathy and interval progression of the pelvic adenopathy. 2. Small volume abdominopelvic ascites with diffuse mesenteric stranding. 3. Scattered bilateral pulmonary micro nodules measuring 1-2 mm. 4. Distended gallbladder with some layering hyperdense material representing layering sludge and tiny stones seen on prior ultrasound. 5. Aortic atherosclerosis.   10/30/2020 Pathology Results   DIAGNOSIS:   BONE MARROW, ASPIRATE, CLOT, CORE:  -  Hypercellular bone marrow with panhyperplasia, atypia, and no  increase in blasts  -  See comment   PERIPHERAL BLOOD:  -  Marked thrombocytopenia  -  Absolute monocytosis  -  Normocytic anemia  -  See CBC data and comment   COMMENT:  The bone marrow is hypercellular for age (approximately 80%) with myeloid hyperplasia with maturational left shift, erythroid hyperplasia, and increased megakaryocytes.  Mild multilineage atypia is present. Blasts are not increased on aspirate smears (1% by manual differential count) or by CD34 immunohistochemistry on the core biopsy.  Concurrent flow cytometric analysis of the bone marrow aspirate demonstrates  increased monocytes, and no increase in blasts or abnormal lymphoid population (see [id number]).  Monocytes are also increased in peripheral  blood,  persistent per electronic medical record.  In aggregate, the  findings raise the possibility of a myeloid neoplasm with the  differential diagnosis including a low-grade myelodysplastic syndrome and chronic myelomonocytic leukemia (dysplastic type).    ADDENDUM:  A reticulin special stain performed on the bone marrow core biopsy reveals no significant increase in reticulin fibrosis.  ADDENDUM:  CYTOGENETIC RESULTS:  Karyotype: 46,XX[20]  Interpretation: NORMAL FEMALE KARYOTYPE   FISH RESULTS:  Results: NORMAL   ADDENDUM:  CD123 immunohistochemistry performed on the core biopsy highlights scattered aggregates of positively staining cells consistent with plasmacytoid dendritic cells.    10/30/2020 Pathology Results   DIAGNOSIS:   BONE MARROW; FLOW CYTOMETRIC ANALYSIS:  -  Increased monocytes  -  Scant B-cells present  -  No immunophenotypically aberrant T-cell population identified  -  No increase in blasts  -  See comment   COMMENT:  Monocytes are relatively increased (12% of all cells), without aberrant expression of CD56.  B-cells comprise <1% of total lymphocytes. CD34-positive blasts are not increased (<1% of all cells).  Correlation with concurrent morphology is recommended for complete diagnostic interpretation and overall blast enumeration (see [id number]).    10/30/2020 Pathology Results   FINAL MICROSCOPIC DIAGNOSIS:   A. LYMPH NODE, RIGHT AXILLARY, NEEDLE CORE BIOPSY:  -Lymphoid tissue present  -See comment   COMMENT:  The sections show several small needle core biopsy fragments of lymphoid tissue displaying degenerative cellular changes/necrosis and hence cannot be accurately evaluated.  Sample for flow cytometric analysis not available.  Immunohistochemical stain for CD3 and CD20 were performed with appropriate controls.  There is a mixture of T and B cells in their apparently respective compartments.  There is no definite metastatic malignancy.    11/11/2020 Pathology  Results   DIAGNOSIS:   LEFT AXILLARY LYMPH NODE EXCISIONAL BIOPSY; FLOW CYTOMETRIC ANALYSIS:  -  No monotypic B-cell or immunophenotypically aberrant T-cell  population identified  -  See comment   COMMENT:  Flow cytometric analysis identifies B-cells with a normal kappa:lambda ratio of 1.7:1.  A subset of the polytypic B-cells expresses CD10. T-cells show a CD4:CD8 ratio of 3.3:1 without immunophenotypic aberrancy with the markers evaluated.  Although these results do not support the diagnosis of a clonal lymphoid population, sampling issues must always be considered when negative results are obtained, as focal lesions may not be represented in the specimen submitted.  In addition, flow cytometric immunophenotyping will not exclude other pathology if present (e.g. Hodgkin lymphoma, some T-cell lymphomas, metastatic and infectious diseases).   11/11/2020 Pathology Results   FINAL MICROSCOPIC DIAGNOSIS:   A. LYMPH NODE, LEFT AXILLARY, DISSECTION:  -  Follicular hyperplasia with interfollicular expansion  -  See comment   COMMENT:  Sections of the lymph nodes reveal generally preserved lymph node architecture.  There is follicular hyperplasia with some follicles showing increased hyalinization of germinal centers and mild concentric encircling of germinal centers with small lymphocytes (onion skinning). Interfollicular areas are expanded with increased vascular proliferation, small lymphocytes, plasma cells, histiocytic cells, and patchy neutrophils.  The lymph node capsule is variably thickened by fibrosis with few plasma cells.   A panel of immunohistochemical stains is performed for further  characterization.  CD3 and CD20/PAX5 highlight T-cell and B-cell  compartments, respectively.  Germinal centers are highlighted by CD10 and BCL6 with appropriate absent expression of BCL2.  CD5 is similar  to CD3.  CD43 also stains the T-cells.  CD4-positive T-cells exceed CD8-positive T-cells.  CD30  highlights scattered immunoblasts.  CD21 reveals intact follicular dendritic cell meshworks.  CD68 highlights increased histiocytic cells.  HHV 8 is negative.  T. pallidum reveals no definitive organisms.  Pancytokeratin is negative.  TdT stains rare cells.  CD138 highlights plasma cells which are not increased in number and show polytypic light chain expression by kappa/lambda in situ  hybridization.  EBV is negative by in situ hybridization.   Concurrent flow cytometric analysis is negative for a monoclonal B-cell or immunophenotypically aberrant T-cell population (see [id number]).   Together, the findings above are non-specific and can be seen in  reactive and infectious processes as well as Castleman disease.  There is no definitive morphologic or flow cytometric evidence of involvement by a lymphoproliferative disorder from the current workup.   12/17/2020 Initial Diagnosis   [name] (chronic myelomonocytic leukemia) (HCC)   12/28/2020 - 02/03/2022 Chemotherapy   Patient is on Treatment Plan : MYELODYSPLASIA  Azacitidine IV D1-5 q28d     03/18/2021 Pathology Results   Final Diagnosis    BONE MARROW:             Persistent chronic myelomonocytic leukemia in a hypercellular bone marrow with increased monocytes and no increase in blasts.   Comment    Flow cytometric analysis showed no increase in blasts and 17% monocytes.A next generation myeloid panel showed the presence of the following mutations: NRAS, SH2B3, PHF6 and TET2. CD34 and CD117 immunstains do not show an increase in blasts. The findings are consistent with persistent [name] with a decrease in the number of blasts compared to that seen on the previous bone marrow.    Final Interpretation      BONE MARROW; FLOW CYTOMETRIC ANALYSIS:   No increased blasts identified. (see comment)  Monocytosis (17% of total events).    COMMENT: Flow cytometry identified about 0.6% of total events as immature cells. These cells express CD45 (dim), CD34,  CD117, HLA-DR, CD38, CD33 (variable). This immunophenotype is consistent with normal myeloblasts. In addition, monocytes are increased and comprise of approximately 17% of total events. These monocytes show normal expression of CD33/CD64/CD14/CD11b with variable expression of CD4. Correlation with concurrent morphology and clinical data is recommended (see [id number]).    FLOW CYTOMETRY ANALYSIS: CD45 versus side scatter analysis demonstrate a predominance of granulocytes (~70%), monocytes (~17%) and lymphocytes (~6%). No significant increase in blasts identified. All tested markers were used for adequate analysis of the cells and were appropriately reviewed.     12/28/2021 -  Chemotherapy   Patient is on Treatment Plan : MYELODYSPLASIA  Azacitidine IV D1-5 q28d        INTERVAL HISTORY:  Kathleen Gibson is here for a follow up of [name] and severe thrombocytopenia  She was last seen by me on 09/12/2022. She presents to the clinic alone . Pt state that she fell and hit her head and ribs on 09/19/2022.Pt was taken to the ER. Pt state that she had a bruise on her legs but not her Ribs. Pt denied having any bleeding that she is aware of.  All other systems were reviewed with the patient and are negative.  MEDICAL HISTORY:  Past Medical History:  Diagnosis Date   Acute bronchitis due to COVID-19 virus 05/16/2022   Diabetes (HCC)    HLD (hyperlipidemia)    Hypertension    Pernicious anemia    Pneumonia 09/05/2019   Renal insufficiency 09/06/2019  Vitamin D deficiency     SURGICAL HISTORY: Past Surgical History:  Procedure Laterality Date   ABDOMINAL HERNIA REPAIR  2009   ABDOMINOPLASTY     AXILLARY LYMPH NODE BIOPSY Left 11/11/2020   Procedure: LEFT AXILLARY EXCISIONAL LYMPH NODE BIOPSY;  Surgeon: Darnell Level, MD;  Location: [facility] OR;  Service: General;  Laterality: Left;   CESAREAN SECTION  1978   GASTRIC BYPASS  2000   hemorroid surgery  2007   IR IMAGING GUIDED PORT INSERTION  07/26/2021   IR  KYPHO EA ADDL LEVEL THORACIC OR LUMBAR  05/31/2021   IR KYPHO THORACIC WITH BONE BIOPSY  05/31/2021   TONSILLECTOMY AND ADENOIDECTOMY  1957    I have reviewed the social history and family history with the patient and they are unchanged from previous note.  ALLERGIES:  is allergic to asa [aspirin], nsaids, and red blood cells.  MEDICATIONS:  Current Outpatient Medications  Medication Sig Dispense Refill   cyanocobalamin (,VITAMIN B-12,) 1000 MCG/ML injection Inject 1,000 mcg into the muscle every 30 (thirty) days. (Patient not taking: Reported on 09/12/2022)     cyclobenzaprine (FLEXERIL) 5 MG tablet Take 1 tablet (5 mg total) by mouth 3 (three) times daily as needed for muscle spasms. 30 tablet 0   furosemide (LASIX) 20 MG tablet TAKE 1 TABLET BY MOUTH EVERY DAY FOR UP TO 7 DAYS THEN AS NEEDED FOR LEG EDEMA 90 tablet 1   glucose blood (ONETOUCH VERIO) test strip Monitor blood sugars twice daily (Patient not taking: Reported on 09/12/2022) 100 each 5   KLOR-CON M10 10 MEQ tablet TAKE 1 TABLET BY MOUTH TWICE A DAY TAKE ALONG WITH FUROSEMIDE ONLY 180 tablet 1   Lancets (ONETOUCH ULTRASOFT) lancets Monitor blood sugars twice daily (Patient not taking: Reported on 09/12/2022) 100 each 5   lidocaine (HM LIDOCAINE PATCH) 4 % Place 2 patches onto the skin daily. 60 patch 0   lidocaine-prilocaine (EMLA) cream APPLY 2 GRAMS TO PORT-A-CATH SITE 30-60 MINUTES PRIOR TO PORT ACCESS AS NEEDED 30 g 1   metoprolol succinate (TOPROL-XL) 25 MG 24 hr tablet Take 1 tablet (25 mg total) by mouth daily. 90 tablet 1   naloxone (NARCAN) nasal spray 4 mg/0.1 mL Place 1 spray into the nose once as needed (overdose). 2 each 0   omeprazole (PRILOSEC) 20 MG capsule Take 20 mg by mouth daily.     ondansetron (ZOFRAN-ODT) 4 MG disintegrating tablet Take 1 tablet (4 mg total) by mouth every 8 (eight) hours as needed for nausea or vomiting. 20 tablet 0   Oxycodone HCl 10 MG TABS Take 1 tablet (10 mg total) by mouth 2 (two) times  daily as needed. 60 tablet 0   Oxycodone HCl 10 MG TABS Take 1 tablet (10 mg total) by mouth 2 (two) times daily as needed. 60 tablet 0   Oxycodone HCl 10 MG TABS Take 1 tablet (10 mg total) by mouth 2 (two) times daily as needed. 60 tablet 0   polyethylene glycol powder (GLYCOLAX/MIRALAX) 17 GM/SCOOP powder Dissolve 1 capful (17 g) in water and drink 2 (two) times daily. (Patient taking differently: Take 17 g by mouth daily.) 510 g 2   pregabalin (LYRICA) 150 MG capsule Take 1 capsule (150 mg total) by mouth 2 (two) times daily. 180 capsule 1   prochlorperazine (COMPAZINE) 10 MG tablet Take 10 mg by mouth every 6 (six) hours as needed.     promethazine-dextromethorphan (PROMETHAZINE-DM) 6.25-15 MG/5ML syrup Take 5 mLs by mouth 4 (four)  times daily as needed. (Patient not taking: Reported on 09/12/2022) 118 mL 0   senna-docusate (SENOKOT-S) 8.6-50 MG tablet Take 2 tablets by mouth at bedtime as needed for mild constipation. 60 tablet 1   sitaGLIPtin-metformin (JANUMET) 50-1000 MG tablet Take 1 tablet by mouth at bedtime. 90 tablet 1   No current facility-administered medications for this visit.   Facility-Administered Medications Ordered in Other Visits  Medication Dose Route Frequency Provider Last Rate Last Admin   0.9 %  sodium chloride infusion (Manually program via Guardrails IV Fluids)  250 mL Intravenous Once Malachy Mood, MD       sodium chloride flush (NS) 0.9 % injection 10 mL  10 mL Intracatheter PRN Malachy Mood, MD   10 mL at 10/10/22 1054    PHYSICAL EXAMINATION: ECOG PERFORMANCE STATUS: 1 - Symptomatic but completely ambulatory  Vitals:   10/10/22 0904  BP: (!) 109/58  Pulse: 76  Resp: 16  Temp: 98.5 F (36.9 C)  SpO2: 98%   Wt Readings from Last 3 Encounters:  10/10/22 148 lb 12.8 oz (67.5 kg)  09/14/22 148 lb (67.1 kg)  09/12/22 148 lb 14.4 oz (67.5 kg)     GENERAL:alert, no distress and comfortable SKIN: skin color normal, no rashes or significant lesions EYES: normal,  Conjunctiva are pink and non-injected, sclera clear  NEURO: alert & oriented x 3 with fluent speech   LABORATORY DATA:  I have reviewed the data as listed    Latest Ref Rng & Units 10/10/2022    8:40 AM 10/05/2022    9:05 AM 09/28/2022    8:43 AM  CBC  WBC 4.0 - 10.5 K/uL 3.7  4.6  7.8   Hemoglobin 12.0 - 15.0 g/dL 9.5  9.4  16.1   Hematocrit 36.0 - 46.0 % 28.5  29.1  30.8   Platelets 150 - 400 K/uL 40  71  83         Latest Ref Rng & Units 10/10/2022    8:40 AM 09/28/2022    8:43 AM 09/19/2022    5:22 PM  CMP  Glucose 70 - 99 mg/dL 096  045  81   BUN 8 - 23 mg/dL 13  14  11    Creatinine 0.44 - 1.00 mg/dL 4.09  8.11  9.14   Sodium 135 - 145 mmol/L 143  142  140   Potassium 3.5 - 5.1 mmol/L 3.9  3.9  3.3   Chloride 98 - 111 mmol/L 111  111  112   CO2 22 - 32 mmol/L 26  26  23    Calcium 8.9 - 10.3 mg/dL 8.2  8.7  7.3   Total Protein 6.5 - 8.1 g/dL  6.5    Total Bilirubin 0.3 - 1.2 mg/dL  1.2    Alkaline Phos 38 - 126 U/L  120    AST 15 - 41 U/L  22    ALT 0 - 44 U/L  20        RADIOGRAPHIC STUDIES: I have personally reviewed the radiological images as listed and agreed with the findings in the report. No results found.    No orders of the defined types were placed in this encounter.  All questions were answered. The patient knows to call the clinic with any problems, questions or concerns. No barriers to learning was detected. The total time spent in the appointment was 25 minutes.     Malachy Mood, MD 10/10/2022   Carolin Coy, CMA, am acting as scribe  for Truitt Merle, MD.   I have reviewed the above documentation for accuracy and completeness, and I agree with the above.

## 2022-10-10 NOTE — Progress Notes (Signed)
Orders for Nplate, port flush w/labs, and blood transfusion parameters faxed to [facility].  Pt is scheduled on 10/19/2022 & 10/29/2022.  Fax confirmation received.  [phone]

## 2022-10-11 ENCOUNTER — Other Ambulatory Visit: Payer: Self-pay

## 2022-10-11 ENCOUNTER — Inpatient Hospital Stay: Payer: Medicare HMO

## 2022-10-11 VITALS — BP 99/54 | HR 73 | Temp 97.8°F | Resp 18

## 2022-10-11 DIAGNOSIS — Z79899 Other long term (current) drug therapy: Secondary | ICD-10-CM | POA: Diagnosis not present

## 2022-10-11 DIAGNOSIS — Z9884 Bariatric surgery status: Secondary | ICD-10-CM | POA: Diagnosis not present

## 2022-10-11 DIAGNOSIS — Z5111 Encounter for antineoplastic chemotherapy: Secondary | ICD-10-CM | POA: Diagnosis not present

## 2022-10-11 DIAGNOSIS — E538 Deficiency of other specified B group vitamins: Secondary | ICD-10-CM | POA: Diagnosis not present

## 2022-10-11 DIAGNOSIS — D693 Immune thrombocytopenic purpura: Secondary | ICD-10-CM | POA: Diagnosis not present

## 2022-10-11 DIAGNOSIS — Z7952 Long term (current) use of systemic steroids: Secondary | ICD-10-CM | POA: Diagnosis not present

## 2022-10-11 DIAGNOSIS — C9310 Chronic myelomonocytic leukemia not having achieved remission: Secondary | ICD-10-CM | POA: Diagnosis not present

## 2022-10-11 DIAGNOSIS — I10 Essential (primary) hypertension: Secondary | ICD-10-CM | POA: Diagnosis not present

## 2022-10-11 DIAGNOSIS — R519 Headache, unspecified: Secondary | ICD-10-CM | POA: Diagnosis not present

## 2022-10-11 DIAGNOSIS — Z7984 Long term (current) use of oral hypoglycemic drugs: Secondary | ICD-10-CM | POA: Diagnosis not present

## 2022-10-11 DIAGNOSIS — E1142 Type 2 diabetes mellitus with diabetic polyneuropathy: Secondary | ICD-10-CM | POA: Diagnosis not present

## 2022-10-11 MED ORDER — SODIUM CHLORIDE 0.9 % IV SOLN: Freq: Once | INTRAVENOUS | Status: AC

## 2022-10-11 MED ORDER — HEPARIN SOD (PORK) LOCK FLUSH 100 UNIT/ML IV SOLN
500.0000 [IU] | Freq: Once | INTRAVENOUS | Status: AC | PRN
  Administered 2022-10-11: 500 [IU]

## 2022-10-11 MED ORDER — SODIUM CHLORIDE 0.9% FLUSH
10.0000 mL | INTRAVENOUS | Status: DC | PRN
  Administered 2022-10-11: 10 mL

## 2022-10-11 MED ORDER — SODIUM CHLORIDE 0.9 % IV SOLN
10.0000 mg | Freq: Once | INTRAVENOUS | Status: AC
  Administered 2022-10-11: 10 mg via INTRAVENOUS
  Filled 2022-10-11: qty 10

## 2022-10-11 MED ORDER — SODIUM CHLORIDE 0.9 % IV SOLN
50.0000 mg/m2 | Freq: Once | INTRAVENOUS | Status: AC
  Administered 2022-10-11: 87 mg via INTRAVENOUS
  Filled 2022-10-11: qty 8.7

## 2022-10-11 MED FILL — Dexamethasone Sodium Phosphate Inj 100 MG/10ML: INTRAMUSCULAR | Qty: 1 | Status: AC

## 2022-10-11 NOTE — Progress Notes (Signed)
Per treatment plan, ok to proceed with PLT of 40

## 2022-10-12 ENCOUNTER — Inpatient Hospital Stay: Payer: Medicare HMO | Attending: Hematology

## 2022-10-12 VITALS — BP 107/49 | HR 72 | Temp 98.2°F | Resp 18

## 2022-10-12 DIAGNOSIS — Z95828 Presence of other vascular implants and grafts: Secondary | ICD-10-CM

## 2022-10-12 DIAGNOSIS — Z5111 Encounter for antineoplastic chemotherapy: Secondary | ICD-10-CM | POA: Insufficient documentation

## 2022-10-12 DIAGNOSIS — D693 Immune thrombocytopenic purpura: Secondary | ICD-10-CM | POA: Insufficient documentation

## 2022-10-12 DIAGNOSIS — C9310 Chronic myelomonocytic leukemia not having achieved remission: Secondary | ICD-10-CM | POA: Diagnosis not present

## 2022-10-12 DIAGNOSIS — D696 Thrombocytopenia, unspecified: Secondary | ICD-10-CM

## 2022-10-12 MED ORDER — SODIUM CHLORIDE 0.9 % IV SOLN
10.0000 mg | Freq: Once | INTRAVENOUS | Status: AC
  Administered 2022-10-12: 10 mg via INTRAVENOUS
  Filled 2022-10-12: qty 10

## 2022-10-12 MED ORDER — HEPARIN SOD (PORK) LOCK FLUSH 100 UNIT/ML IV SOLN: 250.0000 [IU] | Freq: Once | INTRAVENOUS | Status: DC | PRN

## 2022-10-12 MED ORDER — ALTEPLASE 2 MG IJ SOLR: 2.0000 mg | Freq: Once | INTRAMUSCULAR | Status: DC | PRN

## 2022-10-12 MED ORDER — SODIUM CHLORIDE 0.9 % IV SOLN: Freq: Once | INTRAVENOUS | Status: AC

## 2022-10-12 MED ORDER — SODIUM CHLORIDE 0.9 % IV SOLN
50.0000 mg/m2 | Freq: Once | INTRAVENOUS | Status: AC
  Administered 2022-10-12: 87 mg via INTRAVENOUS
  Filled 2022-10-12: qty 8.7

## 2022-10-12 MED ORDER — SODIUM CHLORIDE 0.9% FLUSH: 3.0000 mL | Freq: Once | INTRAVENOUS | Status: DC | PRN

## 2022-10-12 MED ORDER — SODIUM CHLORIDE 0.9% FLUSH
10.0000 mL | INTRAVENOUS | Status: DC | PRN
  Administered 2022-10-12: 10 mL

## 2022-10-12 MED ORDER — SODIUM CHLORIDE 0.9 % IV SOLN: Freq: Once | INTRAVENOUS | Status: DC | PRN

## 2022-10-12 MED ORDER — HEPARIN SOD (PORK) LOCK FLUSH 100 UNIT/ML IV SOLN: 500.0000 [IU] | Freq: Once | INTRAVENOUS | Status: DC | PRN

## 2022-10-12 MED ORDER — HEPARIN SOD (PORK) LOCK FLUSH 100 UNIT/ML IV SOLN
500.0000 [IU] | Freq: Once | INTRAVENOUS | Status: AC | PRN
  Administered 2022-10-12: 500 [IU]

## 2022-10-12 MED ORDER — EPINEPHRINE 0.3 MG/0.3ML IJ SOAJ: 0.3000 mg | Freq: Once | INTRAMUSCULAR | Status: DC | PRN

## 2022-10-12 MED ORDER — FAMOTIDINE 20 MG IN NS 100 ML IVPB: 20.0000 mg | Freq: Once | INTRAVENOUS | Status: DC | PRN

## 2022-10-12 MED ORDER — ROMIPLOSTIM INJECTION 500 MCG
10.0000 ug/kg | Freq: Once | SUBCUTANEOUS | Status: AC
  Administered 2022-10-12: 675 ug via SUBCUTANEOUS
  Filled 2022-10-12: qty 1

## 2022-10-12 MED ORDER — ALBUTEROL SULFATE HFA 108 (90 BASE) MCG/ACT IN AERS: 2.0000 | INHALATION_SPRAY | Freq: Once | RESPIRATORY_TRACT | Status: DC | PRN

## 2022-10-12 MED ORDER — DIPHENHYDRAMINE HCL 50 MG/ML IJ SOLN: 50.0000 mg | Freq: Once | INTRAMUSCULAR | Status: DC | PRN

## 2022-10-12 MED ORDER — METHYLPREDNISOLONE SODIUM SUCC 125 MG IJ SOLR: 125.0000 mg | Freq: Once | INTRAMUSCULAR | Status: DC | PRN

## 2022-10-12 MED FILL — Dexamethasone Sodium Phosphate Inj 100 MG/10ML: INTRAMUSCULAR | Qty: 1 | Status: AC

## 2022-10-12 NOTE — Patient Instructions (Signed)
[facility]  Discharge Instructions: Thank you for choosing [facility] to provide your oncology and hematology care.   If you have a lab appointment with the Cancer Center, please go directly to the Cancer Center and check in at the registration area.   Wear comfortable clothing and clothing appropriate for easy access to any Portacath or PICC line.   We strive to give you quality time with your provider. You may need to reschedule your appointment if you arrive late (15 or more minutes).  Arriving late affects you and other patients whose appointments are after yours.  Also, if you miss three or more appointments without notifying the office, you may be dismissed from the clinic at the provider's discretion.      For prescription refill requests, have your pharmacy contact our office and allow 72 hours for refills to be completed.    Today you received the following chemotherapy and/or immunotherapy agents: Vidaza, NPLATE      To help prevent nausea and vomiting after your treatment, we encourage you to take your nausea medication as directed.  BELOW ARE SYMPTOMS THAT SHOULD BE REPORTED IMMEDIATELY: *FEVER GREATER THAN 100.4 F (38 C) OR HIGHER *CHILLS OR SWEATING *NAUSEA AND VOMITING THAT IS NOT CONTROLLED WITH YOUR NAUSEA MEDICATION *UNUSUAL SHORTNESS OF BREATH *UNUSUAL BRUISING OR BLEEDING *URINARY PROBLEMS (pain or burning when urinating, or frequent urination) *BOWEL PROBLEMS (unusual diarrhea, constipation, pain near the anus) TENDERNESS IN MOUTH AND THROAT WITH OR WITHOUT PRESENCE OF ULCERS (sore throat, sores in mouth, or a toothache) UNUSUAL RASH, SWELLING OR PAIN  UNUSUAL VAGINAL DISCHARGE OR ITCHING   Items with * indicate a potential emergency and should be followed up as soon as possible or go to the Emergency Department if any problems should occur.  Please show the CHEMOTHERAPY ALERT CARD or IMMUNOTHERAPY ALERT CARD at  check-in to the Emergency Department and triage nurse.  Should you have questions after your visit or need to cancel or reschedule your appointment, please contact [facility]  Dept: [phone]  and follow the prompts.  Office hours are 8:00 a.m. to 4:30 p.m. Monday - Friday. Please note that voicemails left after 4:00 p.m. may not be returned until the following business day.  We are closed weekends and major holidays. You have access to a nurse at all times for urgent questions. Please call the main number to the clinic Dept: [phone] and follow the prompts.   For any non-urgent questions, you may also contact your provider using MyChart. We now offer e-Visits for anyone 36 and older to request care online for non-urgent symptoms. For details visit [email or web address].   Also download the MyChart app! Go to the app store, search "MyChart", open the app, select [place], and log in with your MyChart username and password.

## 2022-10-13 ENCOUNTER — Inpatient Hospital Stay: Payer: Medicare HMO

## 2022-10-13 VITALS — BP 123/68 | HR 80 | Temp 98.2°F | Resp 16

## 2022-10-13 DIAGNOSIS — C9310 Chronic myelomonocytic leukemia not having achieved remission: Secondary | ICD-10-CM | POA: Diagnosis not present

## 2022-10-13 DIAGNOSIS — Z5111 Encounter for antineoplastic chemotherapy: Secondary | ICD-10-CM | POA: Diagnosis not present

## 2022-10-13 DIAGNOSIS — D693 Immune thrombocytopenic purpura: Secondary | ICD-10-CM | POA: Diagnosis not present

## 2022-10-13 MED ORDER — SODIUM CHLORIDE 0.9% FLUSH
10.0000 mL | INTRAVENOUS | Status: DC | PRN
  Administered 2022-10-13: 10 mL

## 2022-10-13 MED ORDER — SODIUM CHLORIDE 0.9 % IV SOLN
10.0000 mg | Freq: Once | INTRAVENOUS | Status: AC
  Administered 2022-10-13: 10 mg via INTRAVENOUS
  Filled 2022-10-13: qty 10

## 2022-10-13 MED ORDER — HEPARIN SOD (PORK) LOCK FLUSH 100 UNIT/ML IV SOLN
500.0000 [IU] | Freq: Once | INTRAVENOUS | Status: AC | PRN
  Administered 2022-10-13: 500 [IU]

## 2022-10-13 MED ORDER — SODIUM CHLORIDE 0.9 % IV SOLN
50.0000 mg/m2 | Freq: Once | INTRAVENOUS | Status: AC
  Administered 2022-10-13: 87 mg via INTRAVENOUS
  Filled 2022-10-13: qty 8.7

## 2022-10-13 MED ORDER — SODIUM CHLORIDE 0.9 % IV SOLN: Freq: Once | INTRAVENOUS | Status: AC

## 2022-10-13 MED FILL — Dexamethasone Sodium Phosphate Inj 100 MG/10ML: INTRAMUSCULAR | Qty: 1 | Status: AC

## 2022-10-14 ENCOUNTER — Inpatient Hospital Stay: Payer: Medicare HMO

## 2022-10-14 VITALS — BP 115/67 | HR 79 | Temp 97.8°F | Resp 16

## 2022-10-14 DIAGNOSIS — C9310 Chronic myelomonocytic leukemia not having achieved remission: Secondary | ICD-10-CM

## 2022-10-14 DIAGNOSIS — Z5111 Encounter for antineoplastic chemotherapy: Secondary | ICD-10-CM | POA: Diagnosis not present

## 2022-10-14 DIAGNOSIS — Z95828 Presence of other vascular implants and grafts: Secondary | ICD-10-CM

## 2022-10-14 DIAGNOSIS — D693 Immune thrombocytopenic purpura: Secondary | ICD-10-CM | POA: Diagnosis not present

## 2022-10-14 DIAGNOSIS — D696 Thrombocytopenia, unspecified: Secondary | ICD-10-CM

## 2022-10-14 MED ORDER — HEPARIN SOD (PORK) LOCK FLUSH 100 UNIT/ML IV SOLN
500.0000 [IU] | Freq: Once | INTRAVENOUS | Status: AC | PRN
  Administered 2022-10-14: 500 [IU]

## 2022-10-14 MED ORDER — SODIUM CHLORIDE 0.9% FLUSH
10.0000 mL | Freq: Once | INTRAVENOUS | Status: AC
  Administered 2022-10-14: 10 mL

## 2022-10-14 MED ORDER — SODIUM CHLORIDE 0.9% FLUSH
10.0000 mL | INTRAVENOUS | Status: DC | PRN
  Administered 2022-10-14: 10 mL

## 2022-10-14 MED ORDER — SODIUM CHLORIDE 0.9 % IV SOLN: Freq: Once | INTRAVENOUS | Status: AC

## 2022-10-14 MED ORDER — SODIUM CHLORIDE 0.9 % IV SOLN
10.0000 mg | Freq: Once | INTRAVENOUS | Status: AC
  Administered 2022-10-14: 10 mg via INTRAVENOUS
  Filled 2022-10-14: qty 10

## 2022-10-14 MED ORDER — SODIUM CHLORIDE 0.9 % IV SOLN
50.0000 mg/m2 | Freq: Once | INTRAVENOUS | Status: AC
  Administered 2022-10-14: 87 mg via INTRAVENOUS
  Filled 2022-10-14: qty 8.7

## 2022-10-14 NOTE — Patient Instructions (Signed)
[facility]  Discharge Instructions: Thank you for choosing [facility] to provide your oncology and hematology care.   If you have a lab appointment with the Cancer Center, please go directly to the Cancer Center and check in at the registration area.   Wear comfortable clothing and clothing appropriate for easy access to any Portacath or PICC line.   We strive to give you quality time with your provider. You may need to reschedule your appointment if you arrive late (15 or more minutes).  Arriving late affects you and other patients whose appointments are after yours.  Also, if you miss three or more appointments without notifying the office, you may be dismissed from the clinic at the provider's discretion.      For prescription refill requests, have your pharmacy contact our office and allow 72 hours for refills to be completed.    Today you received the following chemotherapy and/or immunotherapy agents: Vidaza     To help prevent nausea and vomiting after your treatment, we encourage you to take your nausea medication as directed.  BELOW ARE SYMPTOMS THAT SHOULD BE REPORTED IMMEDIATELY: *FEVER GREATER THAN 100.4 F (38 C) OR HIGHER *CHILLS OR SWEATING *NAUSEA AND VOMITING THAT IS NOT CONTROLLED WITH YOUR NAUSEA MEDICATION *UNUSUAL SHORTNESS OF BREATH *UNUSUAL BRUISING OR BLEEDING *URINARY PROBLEMS (pain or burning when urinating, or frequent urination) *BOWEL PROBLEMS (unusual diarrhea, constipation, pain near the anus) TENDERNESS IN MOUTH AND THROAT WITH OR WITHOUT PRESENCE OF ULCERS (sore throat, sores in mouth, or a toothache) UNUSUAL RASH, SWELLING OR PAIN  UNUSUAL VAGINAL DISCHARGE OR ITCHING   Items with * indicate a potential emergency and should be followed up as soon as possible or go to the Emergency Department if any problems should occur.  Please show the CHEMOTHERAPY ALERT CARD or IMMUNOTHERAPY ALERT CARD at check-in  to the Emergency Department and triage nurse.  Should you have questions after your visit or need to cancel or reschedule your appointment, please contact [facility]  Dept: [phone]  and follow the prompts.  Office hours are 8:00 a.m. to 4:30 p.m. Monday - Friday. Please note that voicemails left after 4:00 p.m. may not be returned until the following business day.  We are closed weekends and major holidays. You have access to a nurse at all times for urgent questions. Please call the main number to the clinic Dept: [phone] and follow the prompts.   For any non-urgent questions, you may also contact your provider using MyChart. We now offer e-Visits for anyone 18 and older to request care online for non-urgent symptoms. For details visit [email or web address].   Also download the MyChart app! Go to the app store, search "MyChart", open the app, select [facility], and log in with your MyChart username and password.  

## 2022-10-16 IMAGING — XA IR KYPHO VERTEBRAL THORACIC AUGMENTATION
7 of 9 series · 13 of 24 positions shown · non-contrast
Comparison: MRI of the thoracic spine May 29, 2021

INDICATION: 70-year-old female with complex medical history including [name] with
severe thrombocytopenia, multiple compression fractures, normocytic
anemia, DMII with polyneuropathy, HTN, panic attacks, headaches, and
panic attacks. She has developed multiple pathologic compression
fractures with persistent intractable back pain, most recently at
the T10 and T12 levels which were shown on recent MRI of the
thoracic spine with significant marrow edema.

EXAM:
FLUOROSCOPY GUIDED T10 AND 12 CORE BONE BIOPSY, RADIOFREQUENCY
ABLATION AND BALLOON KYPHOPLASTY

[Series 1: fl neuro · 2 of 28 frames shown (1 of 5)]
[frame 5/28]
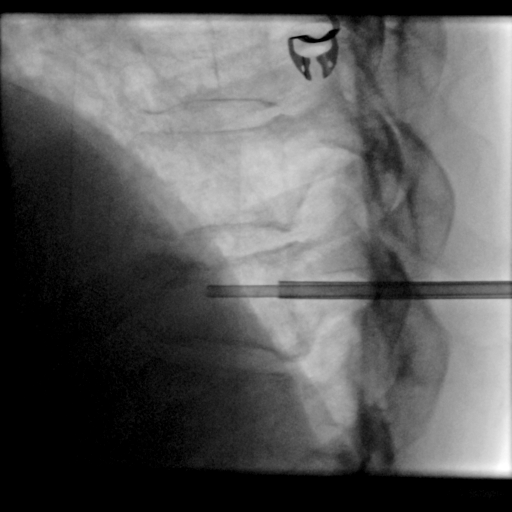
[frame 24/28]
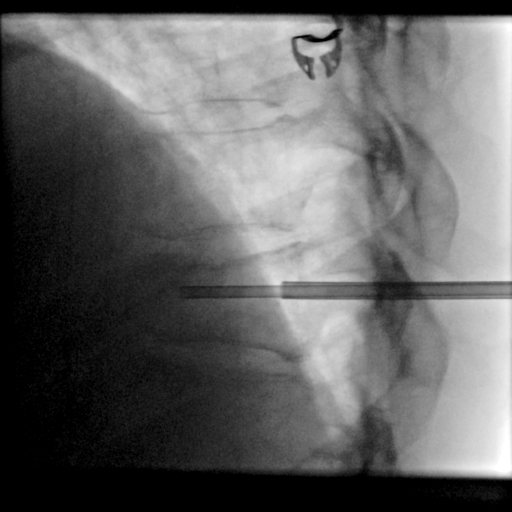

[Series 2: fl neuro · 1 of 131 frames shown (2 of 5)]
[frame 131/131]
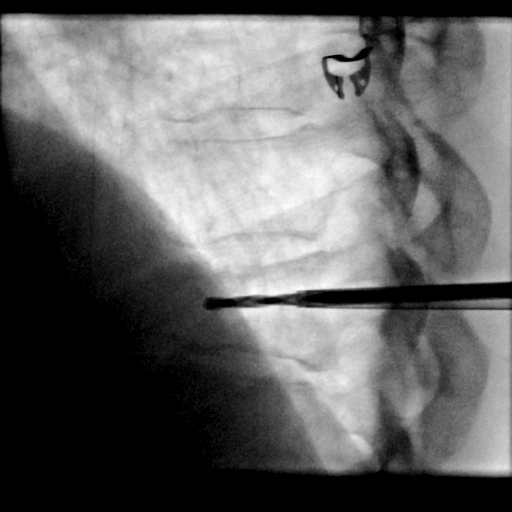

[Series 5: fl neuro · 2 acquisitions, 3 frames shown (3 of 5)]
[im 1/2]
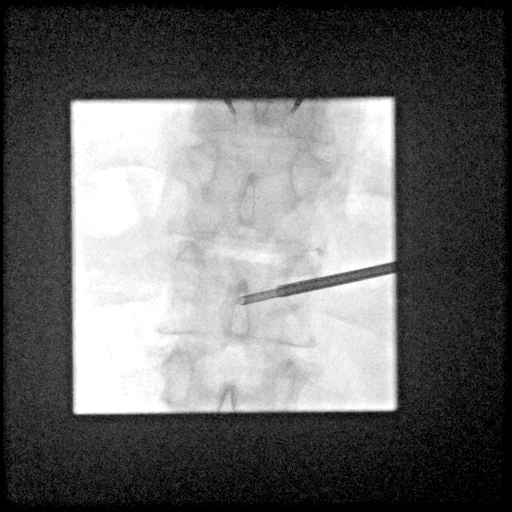
[im 2/2]
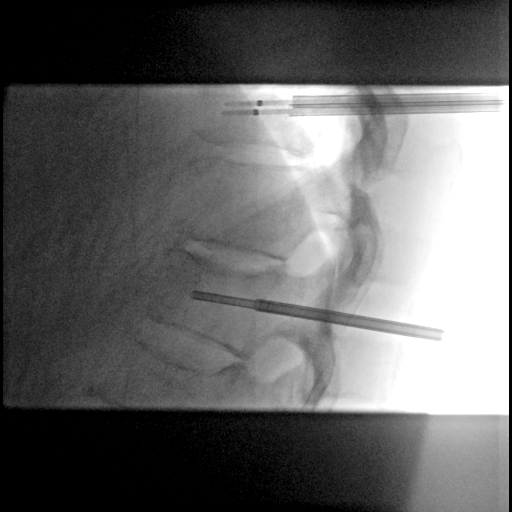
[im 2/2]
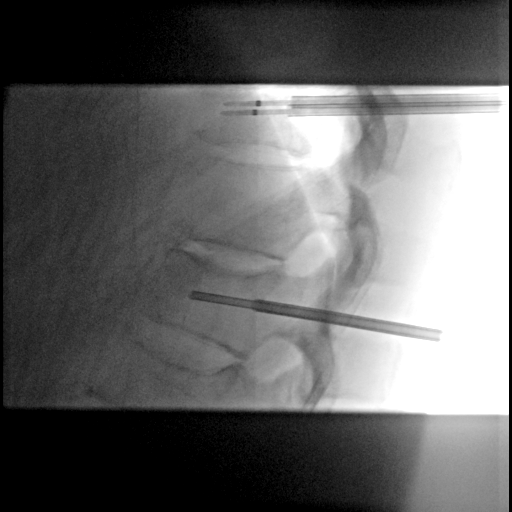

[Series 7: single · 1 of 2 slices shown]
[im 1/2]
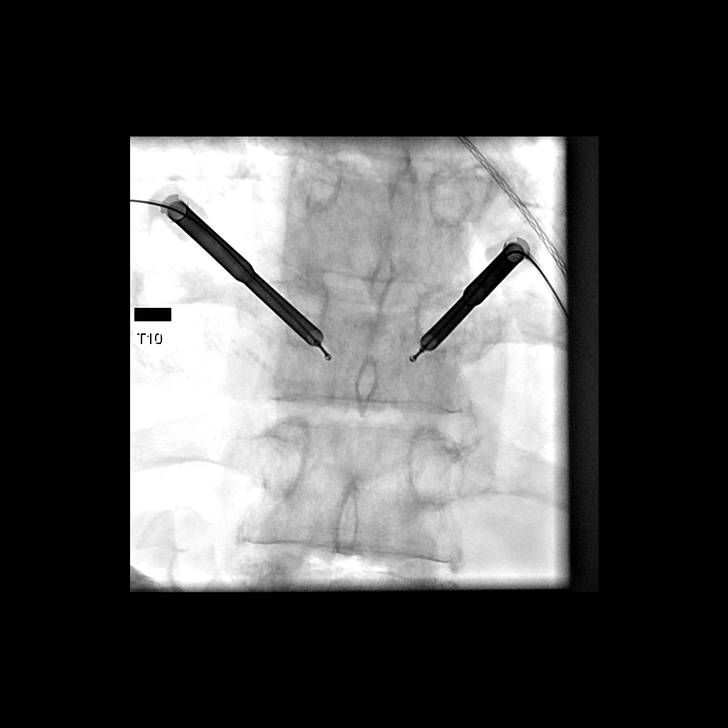

[Series 8: fl neuro · 2 acquisitions, 3 frames shown (4 of 5)]
[im 1/2]
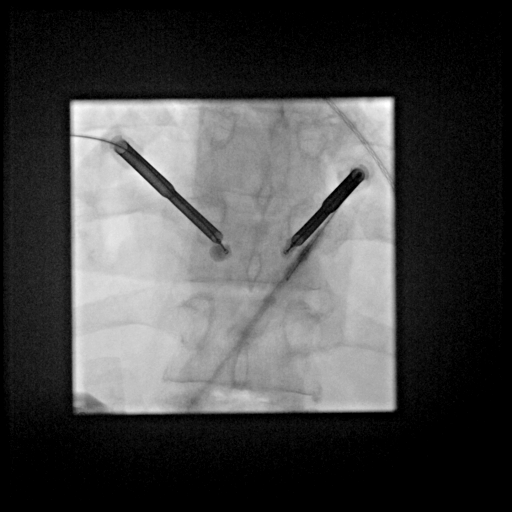
[im 1/2]
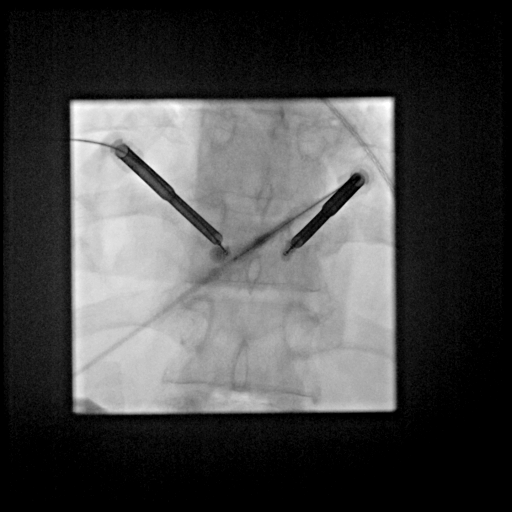
[im 2/2]
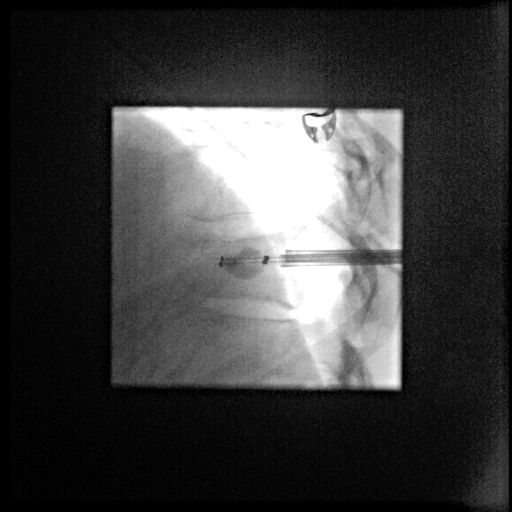

[Series 9: fl neuro · 2 acquisitions, 2 frames shown (5 of 5)]
[im 1/2]
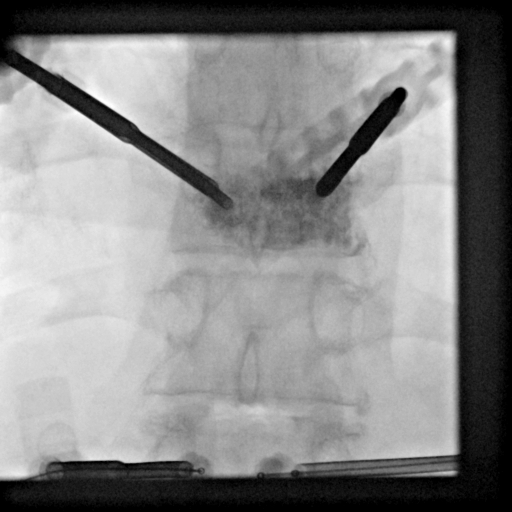
[im 2/2]
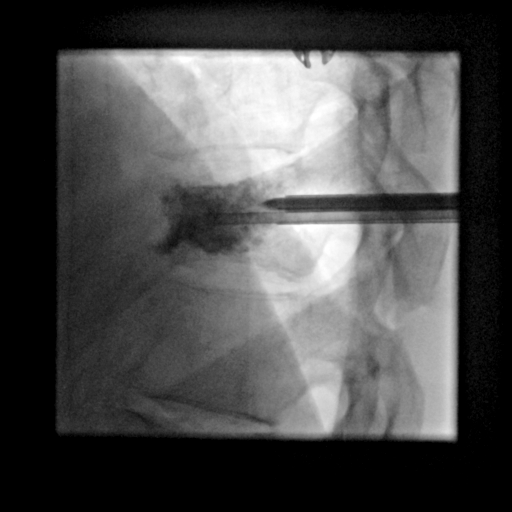

[Series 11: spine · 1 of 2 slices shown]
[im 2/2]
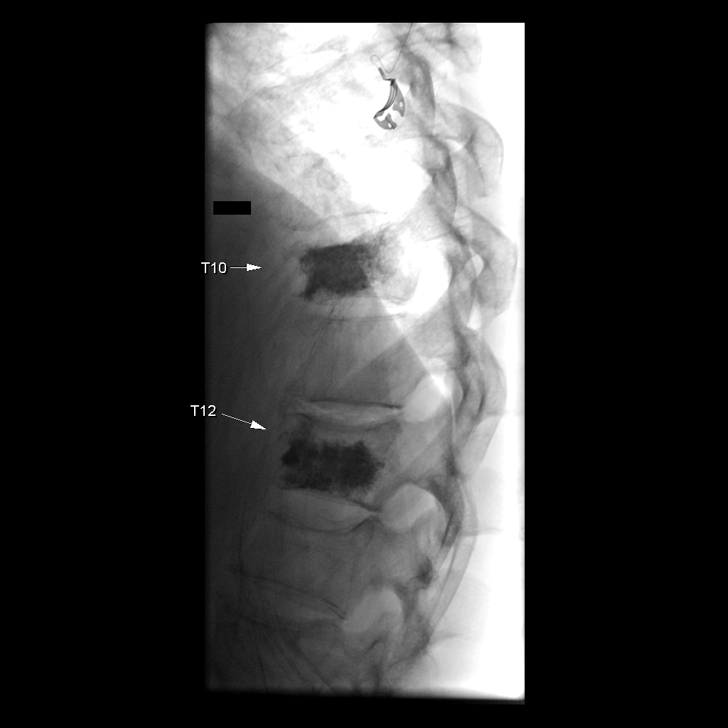

[13 of 24 positions shown; findings below may reference images not displayed]

MEDICATIONS:
As antibiotic prophylaxis, Ancef 2 g IV was ordered pre-procedure
and administered intravenously within 1 hour of incision.

ANESTHESIA/SEDATION:
Moderate (conscious) sedation was employed during this procedure. A
total of Versed 3 mg and Fentanyl 175 mcg was administered
intravenously by the radiology nurse.

Total intra-service moderate Sedation Time: 84 minutes. The
patient's level of consciousness and vital signs were monitored
continuously by radiology nursing throughout the procedure under my
direct supervision.

FLUOROSCOPY TIME:  Fluoroscopy Time: 34 minutes 6 seconds (2,810
mGy)

COMPLICATIONS:
None immediate.

PROCEDURE:
Following a full explanation of the procedure along with the
potential associated complications, an informed witnessed consent
was obtained.

The patient was placed in prone position on the angiography table.
The thoracic region was prepped and draped in a sterile fashion.

Under fluoroscopy, the T10 vertebral body was delineated and the
skin area was marked. The skin was infiltrated with a 1% Lidocaine
approximately 3 cm lateral to the spinous process projection on the
right. Using a 22-gauge spinal needle, the soft issue and the
peripedicular space and periosteum were
infiltrated with Bupivacaine 0.5%. A skin incision was made at the
access site.

Subsequently, an 11-gauge Kyphon trocar was inserted under
fluoroscopic guidance until contact with the pedicle was obtained.
The trocar was inserted into the pedicle with light [place]
until the posterior boundaries of the vertebral body was reached.
The diamond mandrill was removed and one core biopsy was obtained.

The skin was infiltrated with a 1% Lidocaine approximately 3 cm
lateral to the spinous process projection on the left. Using a
22-gauge spinal needle, the soft issue and the peripedicular space
and periosteum were infiltrated with Bupivacaine 0.5%. A skin
incision was made at the access site. Subsequently, an 11-gauge
Kyphon trocar was inserted under fluoroscopic guidance until contact
with the pedicle was obtained. The trocar was inserted with light
[place] into the pedicle until the posterior boundaries of
the vertebral body was reached.

A bone drill was coaxially advanced within the anterior third of the
vertebral body on each side for proper RF probes size selection.
OsteoCool RF probes were inserted bilaterally within the
mid-posterior third of the vertebral body. The radiofrequency
ablation cycle was performed.

Under fluoroscopy, the T12 vertebral body was delineated and the
skin area was marked. The skin was infiltrated with a 1% Lidocaine
approximately 3.5 cm lateral to the spinous process projection on
the right. Using a 22-gauge spinal needle, the soft issue and the
peripedicular space and periosteum were
infiltrated with Bupivacaine 0.5%. A skin incision was made at the
access site.

Subsequently, an 11-gauge Kyphon trocar was inserted under
fluoroscopic guidance until contact with the pedicle was obtained.
The trocar was inserted into the pedicle with light [place]
until the posterior boundaries of the vertebral body was reached.
The diamond mandrill was removed and one core biopsy was obtained.

The skin was infiltrated with a 1% Lidocaine approximately 3.5 cm
lateral to the spinous process projection on the left. Using a
22-gauge spinal needle, the soft issue and the peripedicular space
and periosteum were infiltrated with Bupivacaine 0.5%. A skin
incision was made at the access site. Subsequently, an 11-gauge
Kyphon trocar was inserted under fluoroscopic guidance until contact
with the pedicle was obtained. The trocar was inserted with light
[place] into the pedicle until the posterior boundaries of
the vertebral body was reached. A bone drill was coaxially advanced
within the anterior third of the vertebral body on each side for
proper RF probes size selection.

The T10 level RF probes were exchanged for inflatable Kyphon
balloons, which were centered within the mid-aspect of the vertebral
body. The balloons were inflated to create a void to serve as a
repository for the bone cement.

The OsteoCool RF probes were then inserted bilaterally in the T12
vertebral body within the mid-posterior third of the vertebral body.
The radiofrequency ablation cycle was performed. The RF probes were
exchanged for inflatable Kyphon balloons, which were centered within
the mid-aspect of the vertebral body. The balloons were inflated to
create a void to serve as a repository for the bone cement.

Both the T10 and T12 balloons were deflated and through the
respective cannulas, under continuous fluoroscopy guidance in the AP
and lateral views, the vertebral bodies were filled with previously
mixed polymethyl-methacrylate (PMMA) added to barium for
opacification. Cannulas were later removed.
IMPRESSION: Successful and uncomplicated T10 and T12 core bone biopsy,
radiofrequency ablation and balloon kyphoplasty performed for
treatment of pathologic compression fractures.

## 2022-10-19 ENCOUNTER — Inpatient Hospital Stay: Payer: Medicare HMO

## 2022-10-19 ENCOUNTER — Other Ambulatory Visit: Payer: Self-pay | Admitting: *Deleted

## 2022-10-19 ENCOUNTER — Telehealth: Payer: Self-pay

## 2022-10-19 ENCOUNTER — Telehealth: Payer: Self-pay | Admitting: *Deleted

## 2022-10-19 DIAGNOSIS — D693 Immune thrombocytopenic purpura: Secondary | ICD-10-CM | POA: Diagnosis not present

## 2022-10-19 DIAGNOSIS — D696 Thrombocytopenia, unspecified: Secondary | ICD-10-CM

## 2022-10-19 DIAGNOSIS — C9310 Chronic myelomonocytic leukemia not having achieved remission: Secondary | ICD-10-CM | POA: Diagnosis not present

## 2022-10-19 NOTE — Telephone Encounter (Signed)
Called pt to schedule AWV. Please schedule with health coach or Kathleen Gibson.  

## 2022-10-19 NOTE — Telephone Encounter (Signed)
Kathleen Gibson from [facility] called. Patient receive 1 unit of platelets today per the orders sent this morning. However, they did not provide irradiated platelets as per the order. They wanted to make sure this office was aware. Per Dr. Mosetta Putt this is ok.

## 2022-10-19 NOTE — Progress Notes (Signed)
Per Antonietta Jewel at [facility], orders are needed for platelet transfusion.  Plt of 6 today Hgb of 9.6 today Orders for platelets to be administered at [facility] today. Faxed to [facility].  Pt is scheduled on 10/19/2022 & 10/29/2022.  Fax confirmation received.   [phone]

## 2022-10-24 ENCOUNTER — Encounter: Payer: Self-pay | Admitting: Hematology

## 2022-10-26 ENCOUNTER — Inpatient Hospital Stay: Payer: Medicare HMO

## 2022-10-26 DIAGNOSIS — C9310 Chronic myelomonocytic leukemia not having achieved remission: Secondary | ICD-10-CM | POA: Diagnosis not present

## 2022-10-26 DIAGNOSIS — D693 Immune thrombocytopenic purpura: Secondary | ICD-10-CM | POA: Diagnosis not present

## 2022-10-27 ENCOUNTER — Encounter: Payer: Self-pay | Admitting: Hematology

## 2022-10-31 NOTE — Progress Notes (Unsigned)
[facility]   [phone] [phone]   Clinic Follow up Note   Patient Care Team: Natalia Leatherwood, DO as PCP - General (Family Medicine) Myrtie Neither Andreas Blower, MD as Consulting Physician (Gastroenterology) Malachy Mood, MD as Consulting Physician (Hematology) Glendale Chard, DO as Consulting Physician (Neurology)  Date of Service:  11/02/2022  CHIEF COMPLAINT: f/u of  [name] and severe thrombocytopenia     CURRENT THERAPY:  Azacitidine days 1-5 q28 days, started 12/28/20 -Nplate 56mcg/kg weekly -Platelet transfusion as needed (plt<20K), blood transfusion if Hg<8.0   ASSESSMENT:  Kathleen Gibson is a 72 y.o. female with   [name] (chronic myelomonocytic leukemia) (HCC) -diagnosed in 10/2020, presented with severe thrombocytopenia  --She began azacitidine daily days 1-5 q. 28 days on 12/28/20 -She had worsening thrombocytopenia after stopping Nplate, so it was restarted  -Bone marrow biopsy 03/18/21 showed stable disease, no increase in blasts. -she is tolerating treatment well, blood counts are overall stable  --Continue weekly lab and Nplate injection -Zometa has been held due to her dental issue, I again encouraged her to have dental appointment.  -chemo dose was reduced last cycle due to moderate thrombocytopenia and her travel -lab reviewed , she has a consistent severe thrombocytopenia, started to 16 K today, will proceed with platelet transfusion. -Due to her worsening thrombocytopenia in the past months, I recommend repeating bone marrow biopsy in the next few weeks.  He has appointment with Dr. Sharyne Richters tomorrow, I encouraged him to have bone marrow biopsy at [facility]  -she is scheduled to start next cycle chemotherapy next Monday, I will review with Dr. Sharyne Richters to see if we need to hold her treatment.  Anemia and thrombocytopenia  -Secondary to her [name] and chemotherapy -Platelet counts lower than her usual level, will watch closely -Continue Nplate  injection weekly.   PLAN: -lab reviewed platelet -16K -Recommend platelet transfusion today -Recommend Bone marrow biopsy at [facility] -lab/fluah and f/u on 5/28    SUMMARY OF ONCOLOGIC HISTORY: Oncology History  [name] (chronic myelomonocytic leukemia) (HCC)  10/29/2020 Imaging   CT CAP  IMPRESSION: 1. Splenomegaly with pathologically enlarged lymph nodes above and below the diaphragm, with overall stable to minimally increased abdominal adenopathy and interval progression of the pelvic adenopathy. 2. Small volume abdominopelvic ascites with diffuse mesenteric stranding. 3. Scattered bilateral pulmonary micro nodules measuring 1-2 mm. 4. Distended gallbladder with some layering hyperdense material representing layering sludge and tiny stones seen on prior ultrasound. 5. Aortic atherosclerosis.   10/30/2020 Pathology Results   DIAGNOSIS:   BONE MARROW, ASPIRATE, CLOT, CORE:  -  Hypercellular bone marrow with panhyperplasia, atypia, and no  increase in blasts  -  See comment   PERIPHERAL BLOOD:  -  Marked thrombocytopenia  -  Absolute monocytosis  -  Normocytic anemia  -  See CBC data and comment   COMMENT:  The bone marrow is hypercellular for age (approximately 80%) with myeloid hyperplasia with maturational left shift, erythroid hyperplasia, and increased megakaryocytes.  Mild multilineage atypia is present. Blasts are not increased on aspirate smears (1% by manual differential count) or by CD34 immunohistochemistry on the core biopsy.  Concurrent flow cytometric analysis of the bone marrow aspirate demonstrates increased monocytes, and no increase in blasts or abnormal lymphoid population (see [id number]).  Monocytes are also increased in peripheral  blood, persistent per electronic medical record.  In aggregate, the  findings raise the possibility of a myeloid neoplasm with the  differential diagnosis including a  low-grade myelodysplastic syndrome and chronic myelomonocytic  leukemia (dysplastic type).    ADDENDUM:  A reticulin special stain performed on the bone marrow core biopsy reveals no significant increase in reticulin fibrosis.  ADDENDUM:  CYTOGENETIC RESULTS:  Karyotype: 46,XX[20]  Interpretation: NORMAL FEMALE KARYOTYPE   FISH RESULTS:  Results: NORMAL   ADDENDUM:  CD123 immunohistochemistry performed on the core biopsy highlights scattered aggregates of positively staining cells consistent with plasmacytoid dendritic cells.    10/30/2020 Pathology Results   DIAGNOSIS:   BONE MARROW; FLOW CYTOMETRIC ANALYSIS:  -  Increased monocytes  -  Scant B-cells present  -  No immunophenotypically aberrant T-cell population identified  -  No increase in blasts  -  See comment   COMMENT:  Monocytes are relatively increased (12% of all cells), without aberrant expression of CD56.  B-cells comprise <1% of total lymphocytes. CD34-positive blasts are not increased (<1% of all cells).  Correlation with concurrent morphology is recommended for complete diagnostic interpretation and overall blast enumeration (see [id number]).    10/30/2020 Pathology Results   FINAL MICROSCOPIC DIAGNOSIS:   A. LYMPH NODE, RIGHT AXILLARY, NEEDLE CORE BIOPSY:  -Lymphoid tissue present  -See comment   COMMENT:  The sections show several small needle core biopsy fragments of lymphoid tissue displaying degenerative cellular changes/necrosis and hence cannot be accurately evaluated.  Sample for flow cytometric analysis not available.  Immunohistochemical stain for CD3 and CD20 were performed with appropriate controls.  There is a mixture of T and B cells in their apparently respective compartments.  There is no definite metastatic malignancy.    11/11/2020 Pathology Results   DIAGNOSIS:   LEFT AXILLARY LYMPH NODE EXCISIONAL BIOPSY; FLOW CYTOMETRIC ANALYSIS:  -  No monotypic B-cell or immunophenotypically aberrant T-cell  population identified  -  See comment   COMMENT:  Flow  cytometric analysis identifies B-cells with a normal kappa:lambda ratio of 1.7:1.  A subset of the polytypic B-cells expresses CD10. T-cells show a CD4:CD8 ratio of 3.3:1 without immunophenotypic aberrancy with the markers evaluated.  Although these results do not support the diagnosis of a clonal lymphoid population, sampling issues must always be considered when negative results are obtained, as focal lesions may not be represented in the specimen submitted.  In addition, flow cytometric immunophenotyping will not exclude other pathology if present (e.g. Hodgkin lymphoma, some T-cell lymphomas, metastatic and infectious diseases).   11/11/2020 Pathology Results   FINAL MICROSCOPIC DIAGNOSIS:   A. LYMPH NODE, LEFT AXILLARY, DISSECTION:  -  Follicular hyperplasia with interfollicular expansion  -  See comment   COMMENT:  Sections of the lymph nodes reveal generally preserved lymph node architecture.  There is follicular hyperplasia with some follicles showing increased hyalinization of germinal centers and mild concentric encircling of germinal centers with small lymphocytes (onion skinning). Interfollicular areas are expanded with increased vascular proliferation, small lymphocytes, plasma cells, histiocytic cells, and patchy neutrophils.  The lymph node capsule is variably thickened by fibrosis with few plasma cells.   A panel of immunohistochemical stains is performed for further  characterization.  CD3 and CD20/PAX5 highlight T-cell and B-cell  compartments, respectively.  Germinal centers are highlighted by CD10 and BCL6 with appropriate absent expression of BCL2.  CD5 is similar to CD3.  CD43 also stains the T-cells.  CD4-positive T-cells exceed CD8-positive T-cells.  CD30 highlights scattered immunoblasts.  CD21 reveals intact follicular dendritic cell meshworks.  CD68 highlights increased histiocytic cells.  HHV 8 is negative.  T. pallidum reveals no definitive organisms.  Pancytokeratin  is  negative.  TdT stains rare cells.  CD138 highlights plasma cells which are not increased in number and show polytypic light chain expression by kappa/lambda in situ  hybridization.  EBV is negative by in situ hybridization.   Concurrent flow cytometric analysis is negative for a monoclonal B-cell or immunophenotypically aberrant T-cell population (see [phone]).   Together, the findings above are non-specific and can be seen in  reactive and infectious processes as well as Castleman disease.  There is no definitive morphologic or flow cytometric evidence of involvement by a lymphoproliferative disorder from the current workup.   12/17/2020 Initial Diagnosis   [name] (chronic myelomonocytic leukemia) (HCC)   12/28/2020 - 02/03/2022 Chemotherapy   Patient is on Treatment Plan : MYELODYSPLASIA  Azacitidine IV D1-5 q28d     03/18/2021 Pathology Results   Final Diagnosis    BONE MARROW:             Persistent chronic myelomonocytic leukemia in a hypercellular bone marrow with increased monocytes and no increase in blasts.   Comment    Flow cytometric analysis showed no increase in blasts and 17% monocytes.A next generation myeloid panel showed the presence of the following mutations: NRAS, SH2B3, PHF6 and TET2. CD34 and CD117 immunstains do not show an increase in blasts. The findings are consistent with persistent [name] with a decrease in the number of blasts compared to that seen on the previous bone marrow.    Final Interpretation      BONE MARROW; FLOW CYTOMETRIC ANALYSIS:   No increased blasts identified. (see comment)  Monocytosis (17% of total events).    COMMENT: Flow cytometry identified about 0.6% of total events as immature cells. These cells express CD45 (dim), CD34, CD117, HLA-DR, CD38, CD33 (variable). This immunophenotype is consistent with normal myeloblasts. In addition, monocytes are increased and comprise of approximately 17% of total events. These monocytes show normal  expression of CD33/CD64/CD14/CD11b with variable expression of CD4. Correlation with concurrent morphology and clinical data is recommended (see [id number]).    FLOW CYTOMETRY ANALYSIS: CD45 versus side scatter analysis demonstrate a predominance of granulocytes (~70%), monocytes (~17%) and lymphocytes (~6%). No significant increase in blasts identified. All tested markers were used for adequate analysis of the cells and were appropriately reviewed.     12/28/2021 -  Chemotherapy   Patient is on Treatment Plan : MYELODYSPLASIA  Azacitidine IV D1-5 q28d        INTERVAL HISTORY: Kathleen Gibson is here for a follow up of  [name] and severe thrombocytopenia    She was last seen by me on 10/10/2022.She presents to the clinic alone. Pt states that she had two platelet transfusion.   All other systems were reviewed with the patient and are negative.  MEDICAL HISTORY:  Past Medical History:  Diagnosis Date   Acute bronchitis due to COVID-19 virus 05/16/2022   Diabetes (HCC)    HLD (hyperlipidemia)    Hypertension    Pernicious anemia    Pneumonia 09/05/2019   Renal insufficiency 09/06/2019   Vitamin D deficiency     SURGICAL HISTORY: Past Surgical History:  Procedure Laterality Date   ABDOMINAL HERNIA REPAIR  2009   ABDOMINOPLASTY     AXILLARY LYMPH NODE BIOPSY Left 11/11/2020   Procedure: LEFT AXILLARY EXCISIONAL LYMPH NODE BIOPSY;  Surgeon: Darnell Level, MD;  Location: [facility] OR;  Service: General;  Laterality: Left;   CESAREAN SECTION  1978   GASTRIC BYPASS  2000   hemorroid surgery  2007  IR IMAGING GUIDED PORT INSERTION  07/26/2021   IR KYPHO EA ADDL LEVEL THORACIC OR LUMBAR  05/31/2021   IR KYPHO THORACIC WITH BONE BIOPSY  05/31/2021   TONSILLECTOMY AND ADENOIDECTOMY  1957    I have reviewed the social history and family history with the patient and they are unchanged from previous note.  ALLERGIES:  is allergic to asa [aspirin], nsaids, and red blood cells.  MEDICATIONS:   Current Outpatient Medications  Medication Sig Dispense Refill   cyanocobalamin (,VITAMIN B-12,) 1000 MCG/ML injection Inject 1,000 mcg into the muscle every 30 (thirty) days. (Patient not taking: Reported on 09/12/2022)     cyclobenzaprine (FLEXERIL) 5 MG tablet Take 1 tablet (5 mg total) by mouth 3 (three) times daily as needed for muscle spasms. 30 tablet 0   furosemide (LASIX) 20 MG tablet TAKE 1 TABLET BY MOUTH EVERY DAY FOR UP TO 7 DAYS THEN AS NEEDED FOR LEG EDEMA 90 tablet 1   glucose blood (ONETOUCH VERIO) test strip Monitor blood sugars twice daily (Patient not taking: Reported on 09/12/2022) 100 each 5   KLOR-CON M10 10 MEQ tablet TAKE 1 TABLET BY MOUTH TWICE A DAY TAKE ALONG WITH FUROSEMIDE ONLY 180 tablet 1   Lancets (ONETOUCH ULTRASOFT) lancets Monitor blood sugars twice daily (Patient not taking: Reported on 09/12/2022) 100 each 5   lidocaine (HM LIDOCAINE PATCH) 4 % Place 2 patches onto the skin daily. 60 patch 0   lidocaine-prilocaine (EMLA) cream APPLY 2 GRAMS TO PORT-A-CATH SITE 30-60 MINUTES PRIOR TO PORT ACCESS AS NEEDED 30 g 1   metoprolol succinate (TOPROL-XL) 25 MG 24 hr tablet Take 1 tablet (25 mg total) by mouth daily. 90 tablet 1   naloxone (NARCAN) nasal spray 4 mg/0.1 mL Place 1 spray into the nose once as needed (overdose). 2 each 0   omeprazole (PRILOSEC) 20 MG capsule Take 20 mg by mouth daily.     ondansetron (ZOFRAN-ODT) 4 MG disintegrating tablet Take 1 tablet (4 mg total) by mouth every 8 (eight) hours as needed for nausea or vomiting. 20 tablet 0   Oxycodone HCl 10 MG TABS Take 1 tablet (10 mg total) by mouth 2 (two) times daily as needed. 60 tablet 0   Oxycodone HCl 10 MG TABS Take 1 tablet (10 mg total) by mouth 2 (two) times daily as needed. 60 tablet 0   Oxycodone HCl 10 MG TABS Take 1 tablet (10 mg total) by mouth 2 (two) times daily as needed. 60 tablet 0   polyethylene glycol powder (GLYCOLAX/MIRALAX) 17 GM/SCOOP powder Dissolve 1 capful (17 g) in water and  drink 2 (two) times daily. (Patient taking differently: Take 17 g by mouth daily.) 510 g 2   pregabalin (LYRICA) 150 MG capsule Take 1 capsule (150 mg total) by mouth 2 (two) times daily. 180 capsule 1   prochlorperazine (COMPAZINE) 10 MG tablet Take 10 mg by mouth every 6 (six) hours as needed.     promethazine-dextromethorphan (PROMETHAZINE-DM) 6.25-15 MG/5ML syrup Take 5 mLs by mouth 4 (four) times daily as needed. (Patient not taking: Reported on 09/12/2022) 118 mL 0   senna-docusate (SENOKOT-S) 8.6-50 MG tablet Take 2 tablets by mouth at bedtime as needed for mild constipation. 60 tablet 1   sitaGLIPtin-metformin (JANUMET) 50-1000 MG tablet Take 1 tablet by mouth at bedtime. 90 tablet 1   No current facility-administered medications for this visit.   Facility-Administered Medications Ordered in Other Visits  Medication Dose Route Frequency Provider Last Rate Last Admin  0.9 %  sodium chloride infusion (Manually program via Guardrails IV Fluids)  250 mL Intravenous Once Malachy Mood, MD        PHYSICAL EXAMINATION: ECOG PERFORMANCE STATUS: 1 - Symptomatic but completely ambulatory  Vitals:   11/02/22 0925  BP: (!) 109/57  Pulse: 77  Resp: 18  Temp: 98.6 F (37 C)  SpO2: 100%   Wt Readings from Last 3 Encounters:  11/02/22 145 lb 3.2 oz (65.9 kg)  10/10/22 148 lb 12.8 oz (67.5 kg)  09/14/22 148 lb (67.1 kg)    GENERAL:alert, no distress and comfortable SKIN: skin color normal, no rashes or significant lesions EYES: normal, Conjunctiva are pink and non-injected, sclera clear  NEURO: alert & oriented x 3 with fluent speech  LABORATORY DATA:  I have reviewed the data as listed    Latest Ref Rng & Units 11/02/2022    9:00 AM 10/10/2022    8:40 AM 10/05/2022    9:05 AM  CBC  WBC 4.0 - 10.5 K/uL 5.5  3.7  4.6   Hemoglobin 12.0 - 15.0 g/dL 9.3  9.5  9.4   Hematocrit 36.0 - 46.0 % 28.1  28.5  29.1   Platelets 150 - 400 K/uL 16  40  71         Latest Ref Rng & Units 10/10/2022     8:40 AM 09/28/2022    8:43 AM 09/19/2022    5:22 PM  CMP  Glucose 70 - 99 mg/dL 161  096  81   BUN 8 - 23 mg/dL 13  14  11    Creatinine 0.44 - 1.00 mg/dL 0.45  4.09  8.11   Sodium 135 - 145 mmol/L 143  142  140   Potassium 3.5 - 5.1 mmol/L 3.9  3.9  3.3   Chloride 98 - 111 mmol/L 111  111  112   CO2 22 - 32 mmol/L 26  26  23    Calcium 8.9 - 10.3 mg/dL 8.2  8.7  7.3   Total Protein 6.5 - 8.1 g/dL  6.5    Total Bilirubin 0.3 - 1.2 mg/dL  1.2    Alkaline Phos 38 - 126 U/L  120    AST 15 - 41 U/L  22    ALT 0 - 44 U/L  20        RADIOGRAPHIC STUDIES: I have personally reviewed the radiological images as listed and agreed with the findings in the report. No results found.    Orders Placed This Encounter  Procedures   Informed Consent Details: Physician/Practitioner Attestation; Transcribe to consent form and obtain patient signature    Order Specific Question:   Physician/Practitioner attestation of informed consent for blood and or blood product transfusion    Answer:   I, the physician/practitioner, attest that I have discussed with the patient the benefits, risks, side effects, alternatives, likelihood of achieving goals and potential problems during recovery for the procedure that I have provided informed consent.    Order Specific Question:   Product(s)    Answer:   All Product(s)   All questions were answered. The patient knows to call the clinic with any problems, questions or concerns. No barriers to learning was detected. The total time spent in the appointment was 25 minutes.     Malachy Mood, MD 11/02/2022   Carolin Coy, CMA, am acting as scribe for Malachy Mood, MD.   I have reviewed the above documentation for accuracy and completeness, and I agree with  the above.

## 2022-11-01 NOTE — Assessment & Plan Note (Signed)
-  diagnosed in 10/2020, presented with severe thrombocytopenia  --She began azacitidine daily days 1-5 q. 28 days on 12/28/20 -She had worsening thrombocytopenia after stopping Nplate, so it was restarted  -Bone marrow biopsy 03/18/21 showed stable disease, no increase in blasts. -she is tolerating treatment well, blood counts are overall stable  --Continue weekly lab and Nplate injection -Zometa has been held due to her dental issue, I again encouraged her to have dental appointment.  -chemo dose was reduced last cycle due to moderate thrombocytopenia and her travel -lab reviewed

## 2022-11-02 ENCOUNTER — Inpatient Hospital Stay: Payer: Medicare HMO

## 2022-11-02 ENCOUNTER — Inpatient Hospital Stay (HOSPITAL_BASED_OUTPATIENT_CLINIC_OR_DEPARTMENT_OTHER): Payer: Medicare HMO | Admitting: Hematology

## 2022-11-02 ENCOUNTER — Other Ambulatory Visit: Payer: Self-pay

## 2022-11-02 VITALS — BP 109/57 | HR 77 | Temp 98.6°F | Resp 18 | Ht 66.0 in | Wt 145.2 lb

## 2022-11-02 VITALS — BP 105/53 | HR 75 | Temp 98.4°F | Resp 18

## 2022-11-02 DIAGNOSIS — D696 Thrombocytopenia, unspecified: Secondary | ICD-10-CM

## 2022-11-02 DIAGNOSIS — C9310 Chronic myelomonocytic leukemia not having achieved remission: Secondary | ICD-10-CM | POA: Diagnosis not present

## 2022-11-02 DIAGNOSIS — D693 Immune thrombocytopenic purpura: Secondary | ICD-10-CM | POA: Diagnosis not present

## 2022-11-02 DIAGNOSIS — Z95828 Presence of other vascular implants and grafts: Secondary | ICD-10-CM

## 2022-11-02 DIAGNOSIS — E538 Deficiency of other specified B group vitamins: Secondary | ICD-10-CM

## 2022-11-02 DIAGNOSIS — Z5111 Encounter for antineoplastic chemotherapy: Secondary | ICD-10-CM | POA: Diagnosis not present

## 2022-11-02 LAB — PREPARE PLATELET PHERESIS

## 2022-11-02 LAB — CBC WITH DIFFERENTIAL (CANCER CENTER ONLY)
Abs Immature Granulocytes: 0.1 10*3/uL — ABNORMAL HIGH (ref 0.00–0.07)
Basophils Absolute: 0 10*3/uL (ref 0.0–0.1)
Basophils Relative: 1 %
Eosinophils Absolute: 0.1 10*3/uL (ref 0.0–0.5)
Eosinophils Relative: 1 %
HCT: 28.1 % — ABNORMAL LOW (ref 36.0–46.0)
Hemoglobin: 9.3 g/dL — ABNORMAL LOW (ref 12.0–15.0)
Immature Granulocytes: 2 %
Lymphocytes Relative: 11 %
Lymphs Abs: 0.6 10*3/uL — ABNORMAL LOW (ref 0.7–4.0)
MCH: 28.9 pg (ref 26.0–34.0)
MCHC: 33.1 g/dL (ref 30.0–36.0)
MCV: 87.3 fL (ref 80.0–100.0)
Monocytes Absolute: 1.4 10*3/uL — ABNORMAL HIGH (ref 0.1–1.0)
Monocytes Relative: 26 %
Neutro Abs: 3.3 10*3/uL (ref 1.7–7.7)
Neutrophils Relative %: 59 %
Platelet Count: 16 10*3/uL — ABNORMAL LOW (ref 150–400)
RBC: 3.22 MIL/uL — ABNORMAL LOW (ref 3.87–5.11)
RDW: 20.7 % — ABNORMAL HIGH (ref 11.5–15.5)
WBC Count: 5.5 10*3/uL (ref 4.0–10.5)
nRBC: 0 % (ref 0.0–0.2)

## 2022-11-02 LAB — VITAMIN B12: Vitamin B-12: 1927 pg/mL — ABNORMAL HIGH (ref 180–914)

## 2022-11-02 LAB — BPAM PLATELET PHERESIS

## 2022-11-02 LAB — SAMPLE TO BLOOD BANK

## 2022-11-02 MED ORDER — ROMIPLOSTIM INJECTION 500 MCG
675.0000 ug | Freq: Once | SUBCUTANEOUS | Status: AC
  Administered 2022-11-02: 675 ug via SUBCUTANEOUS
  Filled 2022-11-02: qty 1

## 2022-11-02 MED ORDER — HEPARIN SOD (PORK) LOCK FLUSH 100 UNIT/ML IV SOLN
250.0000 [IU] | Freq: Once | INTRAVENOUS | Status: AC | PRN
  Administered 2022-11-02: 250 [IU]

## 2022-11-02 MED ORDER — SODIUM CHLORIDE 0.9% IV SOLUTION
250.0000 mL | Freq: Once | INTRAVENOUS | Status: AC
  Administered 2022-11-02: 250 mL via INTRAVENOUS

## 2022-11-02 MED ORDER — SODIUM CHLORIDE 0.9% FLUSH
10.0000 mL | Freq: Once | INTRAVENOUS | Status: AC
  Administered 2022-11-02: 10 mL

## 2022-11-02 MED ORDER — SODIUM CHLORIDE 0.9% FLUSH
3.0000 mL | Freq: Once | INTRAVENOUS | Status: AC | PRN
  Administered 2022-11-02: 3 mL

## 2022-11-02 MED ORDER — HEPARIN SOD (PORK) LOCK FLUSH 100 UNIT/ML IV SOLN
500.0000 [IU] | Freq: Once | INTRAVENOUS | Status: AC
  Administered 2022-11-02: 500 [IU]

## 2022-11-02 NOTE — Patient Instructions (Signed)
Platelet Transfusion A platelet transfusion is a procedure in which a person receives donated platelets through an IV. Platelets are parts of blood that stick together and form a clot to help the body stop bleeding after an injury. If you have too few platelets, your blood may have trouble clotting. This may cause you to bleed and bruise very easily. You may need a platelet transfusion if you have a condition that causes a low number of platelets (thrombocytopenia). A platelet transfusion may be used to stop or prevent excessive bleeding. Tell a health care provider about: Any reactions you have had during previous transfusions. Any allergies you have. All medicines you are taking, including vitamins, herbs, eye drops, creams, and over-the-counter medicines. Any bleeding problems you have. Any surgeries you have had. Any medical conditions you have. Whether you are pregnant or may be pregnant. What are the risks? Generally, this is a safe procedure. However, problems may occur, including: Fever. Infection. Allergic reaction to the donated (donor) platelets. Your body's disease-fighting system (immune system) attacking the donor platelets (hemolytic reaction). This is rare. A rare reaction that causes lung damage (transfusion-related acute lung injury). What happens before the procedure? Medicines Ask your health care provider about: Changing or stopping your regular medicines. This is especially important if you are taking diabetes medicines or blood thinners. Taking medicines such as aspirin and ibuprofen. These medicines can thin your blood. Do not take these medicines unless your health care provider tells you to take them. Taking over-the-counter medicines, vitamins, herbs, and supplements. General instructions You will have a blood test to determine your blood type. Your blood type determines what kind of platelets you will be given. Follow instructions from your health care provider  about eating or drinking restrictions. If you have had an allergic reaction to a transfusion in the past, you may be given medicine to help prevent a reaction. Your temperature, blood pressure, pulse, and breathing will be monitored. What happens during the procedure?  An IV will be inserted into one of your veins. For your safety, two health care providers will verify your identity along with the donor platelets about to be infused. A bag of donor platelets will be connected to your IV. The platelets will flow into your bloodstream. This usually takes 30-60 minutes. Your temperature, blood pressure, pulse, and breathing will be monitored during the transfusion. This helps detect early signs of any reaction. You will also be monitored for other symptoms that may indicate a reaction, including chills, hives, or itching. If you have signs of a reaction at any time, your transfusion will be stopped, and you may be given medicine to help manage the reaction. When your transfusion is complete, your IV will be removed. Pressure may be applied to the IV site for a few minutes to stop any bleeding. The IV site will be covered with a bandage (dressing). The procedure may vary among health care providers and hospitals. What can I expect after the procedure? Your blood pressure, temperature, pulse, and breathing will be monitored until you leave the hospital or clinic. You may have some bruising and soreness at your IV site. Follow these instructions at home: Medicines Take over-the-counter and prescription medicines only as told by your health care provider. Talk with your health care provider before you take any medicines that contain aspirin or NSAIDs, such as ibuprofen. These medicines increase your risk for dangerous bleeding. IV site care Check your IV site every day for signs of infection. Check for:   Redness, swelling, or pain. Fluid or blood. If fluid or blood drains from your IV site, use your  hands to press down firmly on a bandage covering the area for a minute or two. Doing this should stop the bleeding. Warmth. Pus or a bad smell. General instructions Change or remove your dressing as told by your health care provider. Return to your normal activities as told by your health care provider. Ask your health care provider what activities are safe for you. Do not take baths, swim, or use a hot tub until your health care provider approves. Ask your health care provider if you may take showers. Keep all follow-up visits. This is important. Contact a health care provider if: You have a headache that does not go away with medicine. You have hives, rash, or itchy skin. You have nausea or vomiting. You feel unusually tired or weak. You have signs of infection at your IV site. Get help right away if: You have a fever or chills. You urinate less often than usual. Your urine is darker colored than normal. You have any of the following: Trouble breathing. Pain in your back, abdomen, or chest. Cool, clammy skin. A fast heartbeat. Summary Platelets are tiny pieces of blood cells that clump together to form a blood clot when you have an injury. If you have too few platelets, your blood may have trouble clotting. A platelet transfusion is a procedure in which you receive donated platelets through an IV. A platelet transfusion may be used to stop or prevent excessive bleeding. After the procedure, check your IV site every day for signs of infection. This information is not intended to replace advice given to you by your health care provider. Make sure you discuss any questions you have with your health care provider. Document Revised: 12/03/2020 Document Reviewed: 12/03/2020 Elsevier Patient Education  2023 [employer]  

## 2022-11-03 ENCOUNTER — Inpatient Hospital Stay: Payer: Medicare HMO

## 2022-11-03 ENCOUNTER — Other Ambulatory Visit: Payer: Self-pay

## 2022-11-03 ENCOUNTER — Telehealth: Payer: Self-pay | Admitting: Hematology

## 2022-11-03 DIAGNOSIS — D696 Thrombocytopenia, unspecified: Secondary | ICD-10-CM

## 2022-11-03 DIAGNOSIS — C9310 Chronic myelomonocytic leukemia not having achieved remission: Secondary | ICD-10-CM

## 2022-11-03 DIAGNOSIS — Z95828 Presence of other vascular implants and grafts: Secondary | ICD-10-CM

## 2022-11-03 DIAGNOSIS — R768 Other specified abnormal immunological findings in serum: Secondary | ICD-10-CM | POA: Diagnosis not present

## 2022-11-03 DIAGNOSIS — D693 Immune thrombocytopenic purpura: Secondary | ICD-10-CM | POA: Diagnosis not present

## 2022-11-03 DIAGNOSIS — D649 Anemia, unspecified: Secondary | ICD-10-CM | POA: Diagnosis not present

## 2022-11-03 DIAGNOSIS — Z5111 Encounter for antineoplastic chemotherapy: Secondary | ICD-10-CM | POA: Diagnosis not present

## 2022-11-03 LAB — CBC WITH DIFFERENTIAL (CANCER CENTER ONLY)
Abs Immature Granulocytes: 0.12 10*3/uL — ABNORMAL HIGH (ref 0.00–0.07)
Basophils Absolute: 0 10*3/uL (ref 0.0–0.1)
Basophils Relative: 1 %
Eosinophils Absolute: 0 10*3/uL (ref 0.0–0.5)
Eosinophils Relative: 1 %
HCT: 28.1 % — ABNORMAL LOW (ref 36.0–46.0)
Hemoglobin: 9.5 g/dL — ABNORMAL LOW (ref 12.0–15.0)
Immature Granulocytes: 2 %
Lymphocytes Relative: 8 %
Lymphs Abs: 0.5 10*3/uL — ABNORMAL LOW (ref 0.7–4.0)
MCH: 29.9 pg (ref 26.0–34.0)
MCHC: 33.8 g/dL (ref 30.0–36.0)
MCV: 88.4 fL (ref 80.0–100.0)
Monocytes Absolute: 1.4 10*3/uL — ABNORMAL HIGH (ref 0.1–1.0)
Monocytes Relative: 22 %
Neutro Abs: 4.2 10*3/uL (ref 1.7–7.7)
Neutrophils Relative %: 66 %
Platelet Count: 19 10*3/uL — ABNORMAL LOW (ref 150–400)
RBC: 3.18 MIL/uL — ABNORMAL LOW (ref 3.87–5.11)
RDW: 20.8 % — ABNORMAL HIGH (ref 11.5–15.5)
WBC Count: 6.2 10*3/uL (ref 4.0–10.5)
nRBC: 0 % (ref 0.0–0.2)

## 2022-11-03 LAB — BPAM PLATELET PHERESIS
Blood Product Expiration Date: 202405232359
Blood Product Expiration Date: 202405252359
ISSUE DATE / TIME: 202405221019
Unit Type and Rh: 5100

## 2022-11-03 LAB — PREPARE PLATELET PHERESIS: Unit division: 0

## 2022-11-03 LAB — SAMPLE TO BLOOD BANK

## 2022-11-03 MED ORDER — SODIUM CHLORIDE 0.9% FLUSH
10.0000 mL | INTRAVENOUS | Status: AC | PRN
  Administered 2022-11-03: 10 mL

## 2022-11-03 MED ORDER — SODIUM CHLORIDE 0.9% FLUSH
10.0000 mL | Freq: Once | INTRAVENOUS | Status: AC
  Administered 2022-11-03: 10 mL

## 2022-11-03 MED ORDER — HEPARIN SOD (PORK) LOCK FLUSH 100 UNIT/ML IV SOLN
500.0000 [IU] | Freq: Every day | INTRAVENOUS | Status: AC | PRN
  Administered 2022-11-03: 500 [IU]

## 2022-11-03 MED ORDER — SODIUM CHLORIDE 0.9% IV SOLUTION
250.0000 mL | Freq: Once | INTRAVENOUS | Status: AC
  Administered 2022-11-03: 250 mL via INTRAVENOUS

## 2022-11-03 NOTE — Patient Instructions (Signed)
Platelet Transfusion A platelet transfusion is a procedure in which a person receives donated platelets through an IV. Platelets are parts of blood that stick together and form a clot to help the body stop bleeding after an injury. If you have too few platelets, your blood may have trouble clotting. This may cause you to bleed and bruise very easily. You may need a platelet transfusion if you have a condition that causes a low number of platelets (thrombocytopenia). A platelet transfusion may be used to stop or prevent excessive bleeding. Tell a health care provider about: Any reactions you have had during previous transfusions. Any allergies you have. All medicines you are taking, including vitamins, herbs, eye drops, creams, and over-the-counter medicines. Any bleeding problems you have. Any surgeries you have had. Any medical conditions you have. Whether you are pregnant or may be pregnant. What are the risks? Generally, this is a safe procedure. However, problems may occur, including: Fever. Infection. Allergic reaction to the donated (donor) platelets. Your body's disease-fighting system (immune system) attacking the donor platelets (hemolytic reaction). This is rare. A rare reaction that causes lung damage (transfusion-related acute lung injury). What happens before the procedure? Medicines Ask your health care provider about: Changing or stopping your regular medicines. This is especially important if you are taking diabetes medicines or blood thinners. Taking medicines such as aspirin and ibuprofen. These medicines can thin your blood. Do not take these medicines unless your health care provider tells you to take them. Taking over-the-counter medicines, vitamins, herbs, and supplements. General instructions You will have a blood test to determine your blood type. Your blood type determines what kind of platelets you will be given. Follow instructions from your health care provider  about eating or drinking restrictions. If you have had an allergic reaction to a transfusion in the past, you may be given medicine to help prevent a reaction. Your temperature, blood pressure, pulse, and breathing will be monitored. What happens during the procedure?  An IV will be inserted into one of your veins. For your safety, two health care providers will verify your identity along with the donor platelets about to be infused. A bag of donor platelets will be connected to your IV. The platelets will flow into your bloodstream. This usually takes 30-60 minutes. Your temperature, blood pressure, pulse, and breathing will be monitored during the transfusion. This helps detect early signs of any reaction. You will also be monitored for other symptoms that may indicate a reaction, including chills, hives, or itching. If you have signs of a reaction at any time, your transfusion will be stopped, and you may be given medicine to help manage the reaction. When your transfusion is complete, your IV will be removed. Pressure may be applied to the IV site for a few minutes to stop any bleeding. The IV site will be covered with a bandage (dressing). The procedure may vary among health care providers and hospitals. What can I expect after the procedure? Your blood pressure, temperature, pulse, and breathing will be monitored until you leave the hospital or clinic. You may have some bruising and soreness at your IV site. Follow these instructions at home: Medicines Take over-the-counter and prescription medicines only as told by your health care provider. Talk with your health care provider before you take any medicines that contain aspirin or NSAIDs, such as ibuprofen. These medicines increase your risk for dangerous bleeding. IV site care Check your IV site every day for signs of infection. Check for:   Redness, swelling, or pain. Fluid or blood. If fluid or blood drains from your IV site, use your  hands to press down firmly on a bandage covering the area for a minute or two. Doing this should stop the bleeding. Warmth. Pus or a bad smell. General instructions Change or remove your dressing as told by your health care provider. Return to your normal activities as told by your health care provider. Ask your health care provider what activities are safe for you. Do not take baths, swim, or use a hot tub until your health care provider approves. Ask your health care provider if you may take showers. Keep all follow-up visits. This is important. Contact a health care provider if: You have a headache that does not go away with medicine. You have hives, rash, or itchy skin. You have nausea or vomiting. You feel unusually tired or weak. You have signs of infection at your IV site. Get help right away if: You have a fever or chills. You urinate less often than usual. Your urine is darker colored than normal. You have any of the following: Trouble breathing. Pain in your back, abdomen, or chest. Cool, clammy skin. A fast heartbeat. Summary Platelets are tiny pieces of blood cells that clump together to form a blood clot when you have an injury. If you have too few platelets, your blood may have trouble clotting. A platelet transfusion is a procedure in which you receive donated platelets through an IV. A platelet transfusion may be used to stop or prevent excessive bleeding. After the procedure, check your IV site every day for signs of infection. This information is not intended to replace advice given to you by your health care provider. Make sure you discuss any questions you have with your health care provider. Document Revised: 12/03/2020 Document Reviewed: 12/03/2020 Elsevier Patient Education  2024 [employer]  

## 2022-11-03 NOTE — Telephone Encounter (Signed)
Contacted patient to scheduled appointments. Left message with appointment details and a call back number if patient had any questions or could not accommodate the time we provided.   

## 2022-11-03 NOTE — Progress Notes (Signed)
Per Dr. Mosetta Putt, pt. to receive 1 unit of platelets today.

## 2022-11-04 LAB — PREPARE PLATELET PHERESIS: Unit division: 0

## 2022-11-04 LAB — BPAM PLATELET PHERESIS
ISSUE DATE / TIME: 202405231303
Unit Type and Rh: 5100

## 2022-11-04 MED FILL — Dexamethasone Sodium Phosphate Inj 100 MG/10ML: INTRAMUSCULAR | Qty: 1 | Status: AC

## 2022-11-05 NOTE — Progress Notes (Unsigned)
Patient Care Team: Natalia Leatherwood, DO as PCP - General (Family Medicine) Myrtie Neither Andreas Blower, MD as Consulting Physician (Gastroenterology) Malachy Mood, MD as Consulting Physician (Hematology) Glendale Chard, DO as Consulting Physician (Neurology)   CHIEF COMPLAINT: Follow up [name] and thrombocytopenia   Oncology History  [name] (chronic myelomonocytic leukemia) (HCC)  10/29/2020 Imaging   CT CAP  IMPRESSION: 1. Splenomegaly with pathologically enlarged lymph nodes above and below the diaphragm, with overall stable to minimally increased abdominal adenopathy and interval progression of the pelvic adenopathy. 2. Small volume abdominopelvic ascites with diffuse mesenteric stranding. 3. Scattered bilateral pulmonary micro nodules measuring 1-2 mm. 4. Distended gallbladder with some layering hyperdense material representing layering sludge and tiny stones seen on prior ultrasound. 5. Aortic atherosclerosis.   10/30/2020 Pathology Results   DIAGNOSIS:   BONE MARROW, ASPIRATE, CLOT, CORE:  -  Hypercellular bone marrow with panhyperplasia, atypia, and no  increase in blasts  -  See comment   PERIPHERAL BLOOD:  -  Marked thrombocytopenia  -  Absolute monocytosis  -  Normocytic anemia  -  See CBC data and comment   COMMENT:  The bone marrow is hypercellular for age (approximately 80%) with myeloid hyperplasia with maturational left shift, erythroid hyperplasia, and increased megakaryocytes.  Mild multilineage atypia is present. Blasts are not increased on aspirate smears (1% by manual differential count) or by CD34 immunohistochemistry on the core biopsy.  Concurrent flow cytometric analysis of the bone marrow aspirate demonstrates increased monocytes, and no increase in blasts or abnormal lymphoid population (see [id number]).  Monocytes are also increased in peripheral  blood, persistent per electronic medical record.  In aggregate, the  findings raise the possibility of a myeloid  neoplasm with the  differential diagnosis including a low-grade myelodysplastic syndrome and chronic myelomonocytic leukemia (dysplastic type).    ADDENDUM:  A reticulin special stain performed on the bone marrow core biopsy reveals no significant increase in reticulin fibrosis.  ADDENDUM:  CYTOGENETIC RESULTS:  Karyotype: 46,XX[20]  Interpretation: NORMAL FEMALE KARYOTYPE   FISH RESULTS:  Results: NORMAL   ADDENDUM:  CD123 immunohistochemistry performed on the core biopsy highlights scattered aggregates of positively staining cells consistent with plasmacytoid dendritic cells.    10/30/2020 Pathology Results   DIAGNOSIS:   BONE MARROW; FLOW CYTOMETRIC ANALYSIS:  -  Increased monocytes  -  Scant B-cells present  -  No immunophenotypically aberrant T-cell population identified  -  No increase in blasts  -  See comment   COMMENT:  Monocytes are relatively increased (12% of all cells), without aberrant expression of CD56.  B-cells comprise <1% of total lymphocytes. CD34-positive blasts are not increased (<1% of all cells).  Correlation with concurrent morphology is recommended for complete diagnostic interpretation and overall blast enumeration (see [id number]).    10/30/2020 Pathology Results   FINAL MICROSCOPIC DIAGNOSIS:   A. LYMPH NODE, RIGHT AXILLARY, NEEDLE CORE BIOPSY:  -Lymphoid tissue present  -See comment   COMMENT:  The sections show several small needle core biopsy fragments of lymphoid tissue displaying degenerative cellular changes/necrosis and hence cannot be accurately evaluated.  Sample for flow cytometric analysis not available.  Immunohistochemical stain for CD3 and CD20 were performed with appropriate controls.  There is a mixture of T and B cells in their apparently respective compartments.  There is no definite metastatic malignancy.    11/11/2020 Pathology Results   DIAGNOSIS:   LEFT AXILLARY LYMPH NODE EXCISIONAL BIOPSY; FLOW CYTOMETRIC ANALYSIS:  -  No  monotypic  B-cell or immunophenotypically aberrant T-cell  population identified  -  See comment   COMMENT:  Flow cytometric analysis identifies B-cells with a normal kappa:lambda ratio of 1.7:1.  A subset of the polytypic B-cells expresses CD10. T-cells show a CD4:CD8 ratio of 3.3:1 without immunophenotypic aberrancy with the markers evaluated.  Although these results do not support the diagnosis of a clonal lymphoid population, sampling issues must always be considered when negative results are obtained, as focal lesions may not be represented in the specimen submitted.  In addition, flow cytometric immunophenotyping will not exclude other pathology if present (e.g. Hodgkin lymphoma, some T-cell lymphomas, metastatic and infectious diseases).   11/11/2020 Pathology Results   FINAL MICROSCOPIC DIAGNOSIS:   A. LYMPH NODE, LEFT AXILLARY, DISSECTION:  -  Follicular hyperplasia with interfollicular expansion  -  See comment   COMMENT:  Sections of the lymph nodes reveal generally preserved lymph node architecture.  There is follicular hyperplasia with some follicles showing increased hyalinization of germinal centers and mild concentric encircling of germinal centers with small lymphocytes (onion skinning). Interfollicular areas are expanded with increased vascular proliferation, small lymphocytes, plasma cells, histiocytic cells, and patchy neutrophils.  The lymph node capsule is variably thickened by fibrosis with few plasma cells.   A panel of immunohistochemical stains is performed for further  characterization.  CD3 and CD20/PAX5 highlight T-cell and B-cell  compartments, respectively.  Germinal centers are highlighted by CD10 and BCL6 with appropriate absent expression of BCL2.  CD5 is similar to CD3.  CD43 also stains the T-cells.  CD4-positive T-cells exceed CD8-positive T-cells.  CD30 highlights scattered immunoblasts.  CD21 reveals intact follicular dendritic cell meshworks.  CD68 highlights  increased histiocytic cells.  HHV 8 is negative.  T. pallidum reveals no definitive organisms.  Pancytokeratin is negative.  TdT stains rare cells.  CD138 highlights plasma cells which are not increased in number and show polytypic light chain expression by kappa/lambda in situ  hybridization.  EBV is negative by in situ hybridization.   Concurrent flow cytometric analysis is negative for a monoclonal B-cell or immunophenotypically aberrant T-cell population (see [phone]).   Together, the findings above are non-specific and can be seen in  reactive and infectious processes as well as Castleman disease.  There is no definitive morphologic or flow cytometric evidence of involvement by a lymphoproliferative disorder from the current workup.   12/17/2020 Initial Diagnosis   [name] (chronic myelomonocytic leukemia) (HCC)   12/28/2020 - 02/03/2022 Chemotherapy   Patient is on Treatment Plan : MYELODYSPLASIA  Azacitidine IV D1-5 q28d     03/18/2021 Pathology Results   Final Diagnosis    BONE MARROW:             Persistent chronic myelomonocytic leukemia in a hypercellular bone marrow with increased monocytes and no increase in blasts.   Comment    Flow cytometric analysis showed no increase in blasts and 17% monocytes.A next generation myeloid panel showed the presence of the following mutations: NRAS, SH2B3, PHF6 and TET2. CD34 and CD117 immunstains do not show an increase in blasts. The findings are consistent with persistent [name] with a decrease in the number of blasts compared to that seen on the previous bone marrow.    Final Interpretation      BONE MARROW; FLOW CYTOMETRIC ANALYSIS:   No increased blasts identified. (see comment)  Monocytosis (17% of total events).    COMMENT: Flow cytometry identified about 0.6% of total events as immature cells. These cells express CD45 (dim), CD34,  CD117, HLA-DR, CD38, CD33 (variable). This immunophenotype is consistent with normal myeloblasts. In  addition, monocytes are increased and comprise of approximately 17% of total events. These monocytes show normal expression of CD33/CD64/CD14/CD11b with variable expression of CD4. Correlation with concurrent morphology and clinical data is recommended (see [id number]).    FLOW CYTOMETRY ANALYSIS: CD45 versus side scatter analysis demonstrate a predominance of granulocytes (~70%), monocytes (~17%) and lymphocytes (~6%). No significant increase in blasts identified. All tested markers were used for adequate analysis of the cells and were appropriately reviewed.     12/28/2021 -  Chemotherapy   Patient is on Treatment Plan : MYELODYSPLASIA  Azacitidine IV D1-5 q28d        CURRENT THERAPY:  Azacitidine days 1-5 q28 days, started 12/28/20 -Nplate 34mcg/kg weekly -Platelet transfusion as needed (plt<20K), blood transfusion if Hg<8.0  INTERVAL HISTORY Kathleen Gibson returns for follow up as scheduled. Last seen by Dr. Mosetta Putt 11/02/22 with platelet transfusion for plt 16K. Seen by Dr. Sharyne Richters last week as well, underwent BMBx ***. Here for next cycle Vidaza.   ROS   Past Medical History:  Diagnosis Date   Acute bronchitis due to COVID-19 virus 05/16/2022   Diabetes (HCC)    HLD (hyperlipidemia)    Hypertension    Pernicious anemia    Pneumonia 09/05/2019   Renal insufficiency 09/06/2019   Vitamin D deficiency      Past Surgical History:  Procedure Laterality Date   ABDOMINAL HERNIA REPAIR  2009   ABDOMINOPLASTY     AXILLARY LYMPH NODE BIOPSY Left 11/11/2020   Procedure: LEFT AXILLARY EXCISIONAL LYMPH NODE BIOPSY;  Surgeon: Darnell Level, MD;  Location: [facility] OR;  Service: General;  Laterality: Left;   CESAREAN SECTION  1978   GASTRIC BYPASS  2000   hemorroid surgery  2007   IR IMAGING GUIDED PORT INSERTION  07/26/2021   IR KYPHO EA ADDL LEVEL THORACIC OR LUMBAR  05/31/2021   IR KYPHO THORACIC WITH BONE BIOPSY  05/31/2021   TONSILLECTOMY AND ADENOIDECTOMY  1957     Outpatient Encounter  Medications as of 11/08/2022  Medication Sig Note   cyanocobalamin (,VITAMIN B-12,) 1000 MCG/ML injection Inject 1,000 mcg into the muscle every 30 (thirty) days. (Patient not taking: Reported on 09/12/2022)    cyclobenzaprine (FLEXERIL) 5 MG tablet Take 1 tablet (5 mg total) by mouth 3 (three) times daily as needed for muscle spasms.    furosemide (LASIX) 20 MG tablet TAKE 1 TABLET BY MOUTH EVERY DAY FOR UP TO 7 DAYS THEN AS NEEDED FOR LEG EDEMA    glucose blood (ONETOUCH VERIO) test strip Monitor blood sugars twice daily (Patient not taking: Reported on 09/12/2022)    KLOR-CON M10 10 MEQ tablet TAKE 1 TABLET BY MOUTH TWICE A DAY TAKE ALONG WITH FUROSEMIDE ONLY    Lancets (ONETOUCH ULTRASOFT) lancets Monitor blood sugars twice daily (Patient not taking: Reported on 09/12/2022)    lidocaine (HM LIDOCAINE PATCH) 4 % Place 2 patches onto the skin daily.    lidocaine-prilocaine (EMLA) cream APPLY 2 GRAMS TO PORT-A-CATH SITE 30-60 MINUTES PRIOR TO PORT ACCESS AS NEEDED    metoprolol succinate (TOPROL-XL) 25 MG 24 hr tablet Take 1 tablet (25 mg total) by mouth daily.    naloxone (NARCAN) nasal spray 4 mg/0.1 mL Place 1 spray into the nose once as needed (overdose). 07/26/2021: never   omeprazole (PRILOSEC) 20 MG capsule Take 20 mg by mouth daily.    ondansetron (ZOFRAN-ODT) 4 MG disintegrating tablet Take 1 tablet (4  mg total) by mouth every 8 (eight) hours as needed for nausea or vomiting.    Oxycodone HCl 10 MG TABS Take 1 tablet (10 mg total) by mouth 2 (two) times daily as needed.    Oxycodone HCl 10 MG TABS Take 1 tablet (10 mg total) by mouth 2 (two) times daily as needed.    Oxycodone HCl 10 MG TABS Take 1 tablet (10 mg total) by mouth 2 (two) times daily as needed.    polyethylene glycol powder (GLYCOLAX/MIRALAX) 17 GM/SCOOP powder Dissolve 1 capful (17 g) in water and drink 2 (two) times daily. (Patient taking differently: Take 17 g by mouth daily.)    pregabalin (LYRICA) 150 MG capsule Take 1 capsule  (150 mg total) by mouth 2 (two) times daily.    prochlorperazine (COMPAZINE) 10 MG tablet Take 10 mg by mouth every 6 (six) hours as needed.    promethazine-dextromethorphan (PROMETHAZINE-DM) 6.25-15 MG/5ML syrup Take 5 mLs by mouth 4 (four) times daily as needed. (Patient not taking: Reported on 09/12/2022)    senna-docusate (SENOKOT-S) 8.6-50 MG tablet Take 2 tablets by mouth at bedtime as needed for mild constipation.    sitaGLIPtin-metformin (JANUMET) 50-1000 MG tablet Take 1 tablet by mouth at bedtime.    Facility-Administered Encounter Medications as of 11/08/2022  Medication   0.9 %  sodium chloride infusion (Manually program via Guardrails IV Fluids)     There were no vitals filed for this visit. There is no height or weight on file to calculate BMI.   PHYSICAL EXAM GENERAL:alert, no distress and comfortable SKIN: no rash  EYES: sclera clear NECK: without mass LYMPH:  no palpable cervical or supraclavicular lymphadenopathy  LUNGS: clear with normal breathing effort HEART: regular rate & rhythm, no lower extremity edema ABDOMEN: abdomen soft, non-tender and normal bowel sounds NEURO: alert & oriented x 3 with fluent speech, no focal motor/sensory deficits Breast exam:  PAC without erythema    CBC    Component Value Date/Time   WBC 6.2 11/03/2022 1220   WBC 4.7 09/19/2022 1722   RBC 3.18 (L) 11/03/2022 1220   HGB 9.5 (L) 11/03/2022 1220   HGB 11.4 (L) 07/23/2015 0910   HCT 28.1 (L) 11/03/2022 1220   HCT 34.9 07/23/2015 0910   PLT 19 (L) 11/03/2022 1220   PLT 103 (L) 07/23/2015 0910   MCV 88.4 11/03/2022 1220   MCV 87.5 07/23/2015 0910   MCH 29.9 11/03/2022 1220   MCHC 33.8 11/03/2022 1220   RDW 20.8 (H) 11/03/2022 1220   RDW 14.8 (H) 07/23/2015 0910   LYMPHSABS 0.5 (L) 11/03/2022 1220   LYMPHSABS 3.1 07/23/2015 0910   MONOABS 1.4 (H) 11/03/2022 1220   MONOABS 2.5 (H) 07/23/2015 0910   EOSABS 0.0 11/03/2022 1220   EOSABS 0.0 07/23/2015 0910   BASOSABS 0.0  11/03/2022 1220   BASOSABS 0.0 07/23/2015 0910     CMP     Component Value Date/Time   NA 143 10/10/2022 0840   NA 137 02/18/2021 0000   K 3.9 10/10/2022 0840   CL 111 10/10/2022 0840   CO2 26 10/10/2022 0840   GLUCOSE 119 (H) 10/10/2022 0840   BUN 13 10/10/2022 0840   BUN 13 02/18/2021 0000   CREATININE 0.62 10/10/2022 0840   CALCIUM 8.2 (L) 10/10/2022 0840   PROT 6.5 09/28/2022 0843   ALBUMIN 3.3 (L) 09/28/2022 0843   AST 22 09/28/2022 0843   AST 28 07/27/2022 1405   ALT 20 09/28/2022 0843   ALT 31 07/27/2022  1405   ALKPHOS 120 09/28/2022 0843   BILITOT 1.2 09/28/2022 0843   BILITOT 1.4 (H) 07/27/2022 1405   GFRNONAA >60 10/10/2022 0840   GFRAA 45 (L) 09/07/2019 0350   GFRAA >60 11/20/2017 0924     ASSESSMENT & PLAN: Kathleen Gibson is a 72 y.o. female with   1. [name] with severe thrombocytopenia  -Initially diagnosed with ITP in 2014. She lost follow up after 11/2017 and did not proceed with recommended bone marrow biopsy. -She was referred to ED 10/28/20 by her PCP for low plt count of 4k. She was hospitalized and treated with platelet transfusions, IVIG, oral prednisone 60mg  and Nplate injections. She tapered off prednisone given little response. -Bone marrow biopsy 10/30/20 felt to likely represent MDS/MPN, particularly [name].  Confirmed on slide review at [facility] by Dr. Sharyne Richters -She began treatment for severe refractory thrombocytopenia with weekly Rituxan x4 on 11/19/20, she did not respond  -She began azacitidine daily days 1-5 q. 28 days on 12/28/2020 -She had worsening thrombocytopenia after stopping Nplate, restarted weekly Nplate 10 mcg/kg on 01/04/2021.  Dose adjustment per platelet level, currently 10 mcg/kg -Bone marrow biopsy 03/18/2021 showed stable disease, no increase in blasts. -She continues azacitidine daily days 1-5 q. 28 days; and weekly Nplate -Seeing Dr. Sharyne Richters who also recommends to continue current regimen    2. Daily headaches -Onset ~1  month, around the time she began generic PPI -I recommend to hold PPI and monitor headaches. If they resolve, she will call Dr. Sharyne Richters for an alternative -I reviewed this may be from vidaza or other medication SE, we will monitor for now -Also discussed important of adequate hydration/nutrition   3.  T7/T8 compression fracture -Platelet count improved, she was able to undergo T10 and T12 kyphoplasty 05/31/2021  -No longer using thoracic brace -Pain managed on rare use of oxycodone -Restart calcium and vitamin D -She has not been able to find a dentist who takes Medicare for Zometa clearance, I previously referred her to Richrd Humbles, DDS. She has not called yet but has their # -Zometa remains on hold   4. Normocytic anemia, moderate  -H/o pernicious anemia/vitamin B12 deficiency -Transfuse for Hgb </= 7.5 -Continue oral iron -B12 >3000 lately, last dose in 08/2022. Will hold injection for now, until B12 normalizes then resume. She is not taking oral B12   5. Type 2 diabetes mellitus with polyneuropathy, Hypertension, anxiety -Continue medication, follow-up PCP -Ativan as needed    PLAN:  No orders of the defined types were placed in this encounter.     All questions were answered. The patient knows to call the clinic with any problems, questions or concerns. No barriers to learning were detected. I spent *** counseling the patient face to face. The total time spent in the appointment was *** and more than 50% was on counseling, review of test results, and coordination of care.   Kathleen Glad, NP-C @DATE @

## 2022-11-08 ENCOUNTER — Inpatient Hospital Stay (HOSPITAL_BASED_OUTPATIENT_CLINIC_OR_DEPARTMENT_OTHER): Payer: Medicare HMO | Admitting: Nurse Practitioner

## 2022-11-08 ENCOUNTER — Inpatient Hospital Stay: Payer: Medicare HMO

## 2022-11-08 ENCOUNTER — Encounter: Payer: Self-pay | Admitting: Nurse Practitioner

## 2022-11-08 DIAGNOSIS — D696 Thrombocytopenia, unspecified: Secondary | ICD-10-CM

## 2022-11-08 DIAGNOSIS — C9310 Chronic myelomonocytic leukemia not having achieved remission: Secondary | ICD-10-CM | POA: Diagnosis not present

## 2022-11-08 DIAGNOSIS — Z5111 Encounter for antineoplastic chemotherapy: Secondary | ICD-10-CM | POA: Diagnosis not present

## 2022-11-08 DIAGNOSIS — D693 Immune thrombocytopenic purpura: Secondary | ICD-10-CM | POA: Diagnosis not present

## 2022-11-08 DIAGNOSIS — Z95828 Presence of other vascular implants and grafts: Secondary | ICD-10-CM

## 2022-11-08 LAB — BASIC METABOLIC PANEL - CANCER CENTER ONLY
Anion gap: 5 (ref 5–15)
BUN: 13 mg/dL (ref 8–23)
CO2: 24 mmol/L (ref 22–32)
Calcium: 7.6 mg/dL — ABNORMAL LOW (ref 8.9–10.3)
Chloride: 112 mmol/L — ABNORMAL HIGH (ref 98–111)
Creatinine: 0.6 mg/dL (ref 0.44–1.00)
GFR, Estimated: >60 mL/min (ref >60)
Glucose, Bld: 111 mg/dL — ABNORMAL HIGH (ref 70–99)
Potassium: 3.8 mmol/L (ref 3.5–5.1)
Sodium: 141 mmol/L (ref 135–145)

## 2022-11-08 LAB — CBC WITH DIFFERENTIAL (CANCER CENTER ONLY)
Abs Immature Granulocytes: 0.19 10*3/uL — ABNORMAL HIGH (ref 0.00–0.07)
Basophils Absolute: 0 10*3/uL (ref 0.0–0.1)
Basophils Relative: 1 %
Eosinophils Absolute: 0 10*3/uL (ref 0.0–0.5)
Eosinophils Relative: 1 %
HCT: 28.7 % — ABNORMAL LOW (ref 36.0–46.0)
Hemoglobin: 9.9 g/dL — ABNORMAL LOW (ref 12.0–15.0)
Immature Granulocytes: 4 %
Lymphocytes Relative: 11 %
Lymphs Abs: 0.6 10*3/uL — ABNORMAL LOW (ref 0.7–4.0)
MCH: 30.3 pg (ref 26.0–34.0)
MCHC: 34.5 g/dL (ref 30.0–36.0)
MCV: 87.8 fL (ref 80.0–100.0)
Monocytes Absolute: 1.2 10*3/uL — ABNORMAL HIGH (ref 0.1–1.0)
Monocytes Relative: 22 %
Neutro Abs: 3.2 10*3/uL (ref 1.7–7.7)
Neutrophils Relative %: 61 %
Platelet Count: 42 10*3/uL — ABNORMAL LOW (ref 150–400)
RBC: 3.27 MIL/uL — ABNORMAL LOW (ref 3.87–5.11)
RDW: 20.2 % — ABNORMAL HIGH (ref 11.5–15.5)
WBC Count: 5.2 10*3/uL (ref 4.0–10.5)
nRBC: 0 % (ref 0.0–0.2)

## 2022-11-08 LAB — SAMPLE TO BLOOD BANK

## 2022-11-08 MED ORDER — SODIUM CHLORIDE 0.9% FLUSH
10.0000 mL | INTRAVENOUS | Status: DC | PRN
  Administered 2022-11-08: 10 mL

## 2022-11-08 MED ORDER — SODIUM CHLORIDE 0.9 % IV SOLN: Freq: Once | INTRAVENOUS | Status: AC

## 2022-11-08 MED ORDER — SODIUM CHLORIDE 0.9% FLUSH
10.0000 mL | Freq: Once | INTRAVENOUS | Status: AC
  Administered 2022-11-08: 10 mL

## 2022-11-08 MED ORDER — SODIUM CHLORIDE 0.9 % IV SOLN
50.0000 mg/m2 | Freq: Once | INTRAVENOUS | Status: DC
  Filled 2022-11-08: qty 8.7

## 2022-11-08 MED ORDER — SODIUM CHLORIDE 0.9 % IV SOLN
75.0000 mg/m2 | Freq: Once | INTRAVENOUS | Status: AC
  Administered 2022-11-08: 130 mg via INTRAVENOUS
  Filled 2022-11-08: qty 13

## 2022-11-08 MED ORDER — SODIUM CHLORIDE 0.9 % IV SOLN
10.0000 mg | Freq: Once | INTRAVENOUS | Status: AC
  Administered 2022-11-08: 10 mg via INTRAVENOUS
  Filled 2022-11-08: qty 10

## 2022-11-08 MED ORDER — LIDOCAINE-PRILOCAINE 2.5-2.5 % EX CREA: TOPICAL_CREAM | CUTANEOUS | 1 refills | Status: DC

## 2022-11-08 MED ORDER — HEPARIN SOD (PORK) LOCK FLUSH 100 UNIT/ML IV SOLN
500.0000 [IU] | Freq: Once | INTRAVENOUS | Status: AC | PRN
  Administered 2022-11-08: 500 [IU]

## 2022-11-08 MED FILL — Dexamethasone Sodium Phosphate Inj 100 MG/10ML: INTRAMUSCULAR | Qty: 1 | Status: AC

## 2022-11-08 NOTE — Patient Instructions (Signed)
[facility]  Discharge Instructions: Thank you for choosing [facility] to provide your oncology and hematology care.   If you have a lab appointment with the Cancer Center, please go directly to the Cancer Center and check in at the registration area.   Wear comfortable clothing and clothing appropriate for easy access to any Portacath or PICC line.   We strive to give you quality time with your provider. You may need to reschedule your appointment if you arrive late (15 or more minutes).  Arriving late affects you and other patients whose appointments are after yours.  Also, if you miss three or more appointments without notifying the office, you may be dismissed from the clinic at the provider's discretion.      For prescription refill requests, have your pharmacy contact our office and allow 72 hours for refills to be completed.    Today you received the following chemotherapy and/or immunotherapy agents: Vidaza     To help prevent nausea and vomiting after your treatment, we encourage you to take your nausea medication as directed.  BELOW ARE SYMPTOMS THAT SHOULD BE REPORTED IMMEDIATELY: *FEVER GREATER THAN 100.4 F (38 C) OR HIGHER *CHILLS OR SWEATING *NAUSEA AND VOMITING THAT IS NOT CONTROLLED WITH YOUR NAUSEA MEDICATION *UNUSUAL SHORTNESS OF BREATH *UNUSUAL BRUISING OR BLEEDING *URINARY PROBLEMS (pain or burning when urinating, or frequent urination) *BOWEL PROBLEMS (unusual diarrhea, constipation, pain near the anus) TENDERNESS IN MOUTH AND THROAT WITH OR WITHOUT PRESENCE OF ULCERS (sore throat, sores in mouth, or a toothache) UNUSUAL RASH, SWELLING OR PAIN  UNUSUAL VAGINAL DISCHARGE OR ITCHING   Items with * indicate a potential emergency and should be followed up as soon as possible or go to the Emergency Department if any problems should occur.  Please show the CHEMOTHERAPY ALERT CARD or IMMUNOTHERAPY ALERT CARD at check-in  to the Emergency Department and triage nurse.  Should you have questions after your visit or need to cancel or reschedule your appointment, please contact [facility]  Dept: [phone]  and follow the prompts.  Office hours are 8:00 a.m. to 4:30 p.m. Monday - Friday. Please note that voicemails left after 4:00 p.m. may not be returned until the following business day.  We are closed weekends and major holidays. You have access to a nurse at all times for urgent questions. Please call the main number to the clinic Dept: [phone] and follow the prompts.   For any non-urgent questions, you may also contact your provider using MyChart. We now offer e-Visits for anyone 18 and older to request care online for non-urgent symptoms. For details visit [email or web address].   Also download the MyChart app! Go to the app store, search "MyChart", open the app, select [place], and log in with your MyChart username and password.  

## 2022-11-08 NOTE — Progress Notes (Signed)
Increase vidaza to 75mg /m2 this cycle per Dr Mosetta Putt.

## 2022-11-09 ENCOUNTER — Other Ambulatory Visit: Payer: Self-pay

## 2022-11-09 ENCOUNTER — Inpatient Hospital Stay: Payer: Medicare HMO

## 2022-11-09 ENCOUNTER — Other Ambulatory Visit: Payer: Self-pay | Admitting: Hematology

## 2022-11-09 VITALS — BP 114/70 | HR 88 | Temp 97.8°F | Resp 16

## 2022-11-09 DIAGNOSIS — C9310 Chronic myelomonocytic leukemia not having achieved remission: Secondary | ICD-10-CM

## 2022-11-09 DIAGNOSIS — Z95828 Presence of other vascular implants and grafts: Secondary | ICD-10-CM

## 2022-11-09 DIAGNOSIS — D693 Immune thrombocytopenic purpura: Secondary | ICD-10-CM | POA: Diagnosis not present

## 2022-11-09 DIAGNOSIS — Z5111 Encounter for antineoplastic chemotherapy: Secondary | ICD-10-CM | POA: Diagnosis not present

## 2022-11-09 DIAGNOSIS — D696 Thrombocytopenia, unspecified: Secondary | ICD-10-CM

## 2022-11-09 MED ORDER — SODIUM CHLORIDE 0.9% FLUSH
10.0000 mL | INTRAVENOUS | Status: DC | PRN
  Administered 2022-11-09: 10 mL

## 2022-11-09 MED ORDER — SODIUM CHLORIDE 0.9 % IV SOLN: Freq: Once | INTRAVENOUS | Status: AC

## 2022-11-09 MED ORDER — HEPARIN SOD (PORK) LOCK FLUSH 100 UNIT/ML IV SOLN
500.0000 [IU] | Freq: Once | INTRAVENOUS | Status: AC | PRN
  Administered 2022-11-09: 500 [IU]

## 2022-11-09 MED ORDER — SODIUM CHLORIDE 0.9 % IV SOLN
10.0000 mg | Freq: Once | INTRAVENOUS | Status: AC
  Administered 2022-11-09: 10 mg via INTRAVENOUS
  Filled 2022-11-09: qty 10

## 2022-11-09 MED ORDER — SODIUM CHLORIDE 0.9 % IV SOLN
75.0000 mg/m2 | Freq: Once | INTRAVENOUS | Status: AC
  Administered 2022-11-09: 130 mg via INTRAVENOUS
  Filled 2022-11-09: qty 13

## 2022-11-09 MED ORDER — ROMIPLOSTIM INJECTION 500 MCG
700.0000 ug | Freq: Once | SUBCUTANEOUS | Status: AC
  Administered 2022-11-09: 700 ug via SUBCUTANEOUS
  Filled 2022-11-09: qty 1

## 2022-11-09 MED FILL — Dexamethasone Sodium Phosphate Inj 100 MG/10ML: INTRAMUSCULAR | Qty: 1 | Status: AC

## 2022-11-09 NOTE — Patient Instructions (Signed)
[facility]  Discharge Instructions: Thank you for choosing [facility] to provide your oncology and hematology care.   If you have a lab appointment with the Cancer Center, please go directly to the Cancer Center and check in at the registration area.   Wear comfortable clothing and clothing appropriate for easy access to any Portacath or PICC line.   We strive to give you quality time with your provider. You may need to reschedule your appointment if you arrive late (15 or more minutes).  Arriving late affects you and other patients whose appointments are after yours.  Also, if you miss three or more appointments without notifying the office, you may be dismissed from the clinic at the provider's discretion.      For prescription refill requests, have your pharmacy contact our office and allow 72 hours for refills to be completed.    Today you received the following chemotherapy and/or immunotherapy agents: Vidaza  & NPlate   To help prevent nausea and vomiting after your treatment, we encourage you to take your nausea medication as directed.  BELOW ARE SYMPTOMS THAT SHOULD BE REPORTED IMMEDIATELY: *FEVER GREATER THAN 100.4 F (38 C) OR HIGHER *CHILLS OR SWEATING *NAUSEA AND VOMITING THAT IS NOT CONTROLLED WITH YOUR NAUSEA MEDICATION *UNUSUAL SHORTNESS OF BREATH *UNUSUAL BRUISING OR BLEEDING *URINARY PROBLEMS (pain or burning when urinating, or frequent urination) *BOWEL PROBLEMS (unusual diarrhea, constipation, pain near the anus) TENDERNESS IN MOUTH AND THROAT WITH OR WITHOUT PRESENCE OF ULCERS (sore throat, sores in mouth, or a toothache) UNUSUAL RASH, SWELLING OR PAIN  UNUSUAL VAGINAL DISCHARGE OR ITCHING   Items with * indicate a potential emergency and should be followed up as soon as possible or go to the Emergency Department if any problems should occur.  Please show the CHEMOTHERAPY ALERT CARD or IMMUNOTHERAPY ALERT CARD at  check-in to the Emergency Department and triage nurse.  Should you have questions after your visit or need to cancel or reschedule your appointment, please contact [facility]  Dept: [phone]  and follow the prompts.  Office hours are 8:00 a.m. to 4:30 p.m. Monday - Friday. Please note that voicemails left after 4:00 p.m. may not be returned until the following business day.  We are closed weekends and major holidays. You have access to a nurse at all times for urgent questions. Please call the main number to the clinic Dept: [phone] and follow the prompts.   For any non-urgent questions, you may also contact your provider using MyChart. We now offer e-Visits for anyone 3 and older to request care online for non-urgent symptoms. For details visit [email or web address].   Also download the MyChart app! Go to the app store, search "MyChart", open the app, select [place], and log in with your MyChart username and password.  Azacitidine Injection What is this medication? AZACITIDINE (ay za SITE i deen) treats blood and bone marrow cancers. It works by slowing down the growth of cancer cells. This medicine may be used for other purposes; ask your health care provider or pharmacist if you have questions. COMMON BRAND NAME(S): Vidaza What should I tell my care team before I take this medication? They need to know if you have any of these conditions: Kidney disease Liver disease Low blood cell levels, such as low white cells, platelets, or red blood cells Low levels of albumin in the blood Low levels of bicarbonate in the blood  An unusual or allergic reaction to azacitidine, mannitol, other medications, foods, dyes, or preservatives If you or your partner are pregnant or trying to get pregnant Breast-feeding How should I use this medication? This medication is injected into a vein or under the skin. It is given by your care team in a hospital or  clinic setting. Talk to your care team about the use of this medication in children. While it may be prescribed for children as young as 1 month for selected conditions, precautions do apply. Overdosage: If you think you have taken too much of this medicine contact a poison control center or emergency room at once. NOTE: This medicine is only for you. Do not share this medicine with others. What if I miss a dose? Keep appointments for follow-up doses. It is important not to miss your dose. Call your care team if you are unable to keep an appointment. What may interact with this medication? Interactions are not expected. This list may not describe all possible interactions. Give your health care provider a list of all the medicines, herbs, non-prescription drugs, or dietary supplements you use. Also tell them if you smoke, drink alcohol, or use illegal drugs. Some items may interact with your medicine. What should I watch for while using this medication? Your condition will be monitored carefully while you are receiving this medication. You may need blood work while taking this medication. This medication may make you feel generally unwell. This is not uncommon as chemotherapy can affect healthy cells as well as cancer cells. Report any side effects. Continue your course of treatment even though you feel ill unless your care team tells you to stop. Other product types may be available that contain the medication azacitidine. The injection and oral products should not be used in place of one another. Talk to your care team if you have questions. This medication can cause serious side effects. To reduce the risk, your care team may give you other medications to take before receiving this one. Be sure to follow the directions from your care team. This medication may increase your risk of getting an infection. Call your care team for advice if you get a fever, chills, sore throat, or other symptoms of a  cold or flu. Do not treat yourself. Try to avoid being around people who are sick. Avoid taking medications that contain aspirin, acetaminophen, ibuprofen, naproxen, or ketoprofen unless instructed by your care team. These medications may hide a fever. This medication may increase your risk to bruise or bleed. Call your care team if you notice any unusual bleeding. Be careful brushing or flossing your teeth or using a toothpick because you may get an infection or bleed more easily. If you have any dental work done, tell your dentist you are receiving this medication. Talk to your care team if you or your partner may be pregnant. Serious birth defects can occur if you take this medication during pregnancy and for 6 months after the last dose. You will need a negative pregnancy test before starting this medication. Contraception is recommended while taking his medication and for 6 months after the last dose. Your care team can help you find the option that works for you. If your partner can get pregnant, use a condom during sex while taking this medication and for 3 months after the last dose. Do not breastfeed while taking this medication and for 1 week after the last dose. This medication may cause infertility. Talk to your care  team if you are concerned about your fertility. What side effects may I notice from receiving this medication? Side effects that you should report to your care team as soon as possible: Allergic reactions--skin rash, itching, hives, swelling of the face, lips, tongue, or throat Infection--fever, chills, cough, sore throat, wounds that don't heal, pain or trouble when passing urine, general feeling of discomfort or being unwell Kidney injury--decrease in the amount of urine, swelling of the ankles, hands, or feet Liver injury--right upper belly pain, loss of appetite, nausea, light-colored stool, dark yellow or brown urine, yellowing skin or eyes, unusual weakness or fatigue Low  red blood cell level--unusual weakness or fatigue, dizziness, headache, trouble breathing Tumor lysis syndrome (TLS)--nausea, vomiting, diarrhea, decrease in the amount of urine, dark urine, unusual weakness or fatigue, confusion, muscle pain or cramps, fast or irregular heartbeat, joint pain Unusual bruising or bleeding Side effects that usually do not require medical attention (report to your care team if they continue or are bothersome): Constipation Diarrhea Nausea Pain, redness, or irritation at injection site Vomiting This list may not describe all possible side effects. Call your doctor for medical advice about side effects. You may report side effects to FDA at 1-800-FDA-1088. Where should I keep my medication? This medication is given in a hospital or clinic. It will not be stored at home. NOTE: This sheet is a summary. It may not cover all possible information. If you have questions about this medicine, talk to your doctor, pharmacist, or health care provider.  2024 Elsevier/Gold Standard (2021-10-14 00:00:00)  Romiplostim Injection What is this medication? ROMIPLOSTIM (roe mi PLOE stim) treats low levels of platelets in your body caused by immune thrombocytopenia (ITP). It is prescribed when other medications have not worked or cannot be tolerated. It may also be used to help people who have been exposed to high doses of radiation. It works by increasing the amount of platelets in your blood. This lowers the risk of bleeding. This medicine may be used for other purposes; ask your health care provider or pharmacist if you have questions. COMMON BRAND NAME(S): Nplate What should I tell my care team before I take this medication? They need to know if you have any of these conditions: Blood clots Myelodysplastic syndrome An unusual or allergic reaction to romiplostim, mannitol, other medications, foods, dyes, or preservatives Pregnant or trying to get pregnant Breast-feeding How  should I use this medication? This medication is injected under the skin. It is given by a care team in a hospital or clinic setting. A special MedGuide will be given to you before each treatment. Be sure to read this information carefully each time. Talk to your care team about the use of this medication in children. While it may be prescribed for children as young as newborns for selected conditions, precautions do apply. Overdosage: If you think you have taken too much of this medicine contact a poison control center or emergency room at once. NOTE: This medicine is only for you. Do not share this medicine with others. What if I miss a dose? Keep appointments for follow-up doses. It is important not to miss your dose. Call your care team if you are unable to keep an appointment. What may interact with this medication? Interactions are not expected. This list may not describe all possible interactions. Give your health care provider a list of all the medicines, herbs, non-prescription drugs, or dietary supplements you use. Also tell them if you smoke, drink alcohol, or use  illegal drugs. Some items may interact with your medicine. What should I watch for while using this medication? Visit your care team for regular checks on your progress. You may need blood work done while you are taking this medication. Your condition will be monitored carefully while you are receiving this medication. It is important not to miss any appointments. What side effects may I notice from receiving this medication? Side effects that you should report to your care team as soon as possible: Allergic reactions--skin rash, itching, hives, swelling of the face, lips, tongue, or throat Blood clot--pain, swelling, or warmth in the leg, shortness of breath, chest pain Side effects that usually do not require medical attention (report to your care team if they continue or are bothersome): Dizziness Joint pain Muscle  pain Pain in the hands or feet Stomach pain Trouble sleeping This list may not describe all possible side effects. Call your doctor for medical advice about side effects. You may report side effects to FDA at 1-800-FDA-1088. Where should I keep my medication? This medication is given in a hospital or clinic. It will not be stored at home. NOTE: This sheet is a summary. It may not cover all possible information. If you have questions about this medicine, talk to your doctor, pharmacist, or health care provider.  2024 Elsevier/Gold Standard (2021-10-04 00:00:00)

## 2022-11-10 ENCOUNTER — Inpatient Hospital Stay: Payer: Medicare HMO

## 2022-11-10 VITALS — BP 107/58 | HR 76 | Temp 98.2°F | Resp 16

## 2022-11-10 DIAGNOSIS — C9310 Chronic myelomonocytic leukemia not having achieved remission: Secondary | ICD-10-CM

## 2022-11-10 DIAGNOSIS — Z5111 Encounter for antineoplastic chemotherapy: Secondary | ICD-10-CM | POA: Diagnosis not present

## 2022-11-10 DIAGNOSIS — D693 Immune thrombocytopenic purpura: Secondary | ICD-10-CM | POA: Diagnosis not present

## 2022-11-10 MED ORDER — SODIUM CHLORIDE 0.9 % IV SOLN
10.0000 mg | Freq: Once | INTRAVENOUS | Status: AC
  Administered 2022-11-10: 10 mg via INTRAVENOUS
  Filled 2022-11-10: qty 10

## 2022-11-10 MED ORDER — SODIUM CHLORIDE 0.9% FLUSH
10.0000 mL | INTRAVENOUS | Status: DC | PRN
  Administered 2022-11-10: 10 mL

## 2022-11-10 MED ORDER — HEPARIN SOD (PORK) LOCK FLUSH 100 UNIT/ML IV SOLN
500.0000 [IU] | Freq: Once | INTRAVENOUS | Status: AC | PRN
  Administered 2022-11-10: 500 [IU]

## 2022-11-10 MED ORDER — SODIUM CHLORIDE 0.9 % IV SOLN
75.0000 mg/m2 | Freq: Once | INTRAVENOUS | Status: AC
  Administered 2022-11-10: 130 mg via INTRAVENOUS
  Filled 2022-11-10: qty 13

## 2022-11-10 MED ORDER — SODIUM CHLORIDE 0.9 % IV SOLN: Freq: Once | INTRAVENOUS | Status: AC

## 2022-11-10 MED FILL — Dexamethasone Sodium Phosphate Inj 100 MG/10ML: INTRAMUSCULAR | Qty: 1 | Status: AC

## 2022-11-10 NOTE — Patient Instructions (Signed)
[facility]  Discharge Instructions: Thank you for choosing [facility] to provide your oncology and hematology care.   If you have a lab appointment with the Cancer Center, please go directly to the Cancer Center and check in at the registration area.   Wear comfortable clothing and clothing appropriate for easy access to any Portacath or PICC line.   We strive to give you quality time with your provider. You may need to reschedule your appointment if you arrive late (15 or more minutes).  Arriving late affects you and other patients whose appointments are after yours.  Also, if you miss three or more appointments without notifying the office, you may be dismissed from the clinic at the provider's discretion.      For prescription refill requests, have your pharmacy contact our office and allow 72 hours for refills to be completed.    Today you received the following chemotherapy and/or immunotherapy agents: Vidaza     To help prevent nausea and vomiting after your treatment, we encourage you to take your nausea medication as directed.  BELOW ARE SYMPTOMS THAT SHOULD BE REPORTED IMMEDIATELY: *FEVER GREATER THAN 100.4 F (38 C) OR HIGHER *CHILLS OR SWEATING *NAUSEA AND VOMITING THAT IS NOT CONTROLLED WITH YOUR NAUSEA MEDICATION *UNUSUAL SHORTNESS OF BREATH *UNUSUAL BRUISING OR BLEEDING *URINARY PROBLEMS (pain or burning when urinating, or frequent urination) *BOWEL PROBLEMS (unusual diarrhea, constipation, pain near the anus) TENDERNESS IN MOUTH AND THROAT WITH OR WITHOUT PRESENCE OF ULCERS (sore throat, sores in mouth, or a toothache) UNUSUAL RASH, SWELLING OR PAIN  UNUSUAL VAGINAL DISCHARGE OR ITCHING   Items with * indicate a potential emergency and should be followed up as soon as possible or go to the Emergency Department if any problems should occur.  Please show the CHEMOTHERAPY ALERT CARD or IMMUNOTHERAPY ALERT CARD at check-in  to the Emergency Department and triage nurse.  Should you have questions after your visit or need to cancel or reschedule your appointment, please contact [facility]  Dept: [phone]  and follow the prompts.  Office hours are 8:00 a.m. to 4:30 p.m. Monday - Friday. Please note that voicemails left after 4:00 p.m. may not be returned until the following business day.  We are closed weekends and major holidays. You have access to a nurse at all times for urgent questions. Please call the main number to the clinic Dept: [phone] and follow the prompts.   For any non-urgent questions, you may also contact your provider using MyChart. We now offer e-Visits for anyone 18 and older to request care online for non-urgent symptoms. For details visit [email or web address].   Also download the MyChart app! Go to the app store, search "MyChart", open the app, select [place], and log in with your MyChart username and password.  

## 2022-11-11 ENCOUNTER — Inpatient Hospital Stay: Payer: Medicare HMO

## 2022-11-11 VITALS — BP 138/75 | HR 99 | Temp 98.4°F | Resp 18

## 2022-11-11 DIAGNOSIS — C9310 Chronic myelomonocytic leukemia not having achieved remission: Secondary | ICD-10-CM

## 2022-11-11 DIAGNOSIS — Z5111 Encounter for antineoplastic chemotherapy: Secondary | ICD-10-CM | POA: Diagnosis not present

## 2022-11-11 DIAGNOSIS — D693 Immune thrombocytopenic purpura: Secondary | ICD-10-CM | POA: Diagnosis not present

## 2022-11-11 MED ORDER — HEPARIN SOD (PORK) LOCK FLUSH 100 UNIT/ML IV SOLN
500.0000 [IU] | Freq: Once | INTRAVENOUS | Status: AC | PRN
  Administered 2022-11-11: 500 [IU]

## 2022-11-11 MED ORDER — SODIUM CHLORIDE 0.9 % IV SOLN
10.0000 mg | Freq: Once | INTRAVENOUS | Status: AC
  Administered 2022-11-11: 10 mg via INTRAVENOUS
  Filled 2022-11-11: qty 10

## 2022-11-11 MED ORDER — SODIUM CHLORIDE 0.9 % IV SOLN
75.0000 mg/m2 | Freq: Once | INTRAVENOUS | Status: AC
  Administered 2022-11-11: 130 mg via INTRAVENOUS
  Filled 2022-11-11: qty 13

## 2022-11-11 MED ORDER — SODIUM CHLORIDE 0.9 % IV SOLN: Freq: Once | INTRAVENOUS | Status: AC

## 2022-11-11 MED ORDER — SODIUM CHLORIDE 0.9% FLUSH
10.0000 mL | INTRAVENOUS | Status: DC | PRN
  Administered 2022-11-11: 10 mL

## 2022-11-11 MED FILL — Dexamethasone Sodium Phosphate Inj 100 MG/10ML: INTRAMUSCULAR | Qty: 1 | Status: AC

## 2022-11-11 NOTE — Patient Instructions (Signed)
[facility]  Discharge Instructions: Thank you for choosing [facility] to provide your oncology and hematology care.   If you have a lab appointment with the Cancer Center, please go directly to the Cancer Center and check in at the registration area.   Wear comfortable clothing and clothing appropriate for easy access to any Portacath or PICC line.   We strive to give you quality time with your provider. You may need to reschedule your appointment if you arrive late (15 or more minutes).  Arriving late affects you and other patients whose appointments are after yours.  Also, if you miss three or more appointments without notifying the office, you may be dismissed from the clinic at the provider's discretion.      For prescription refill requests, have your pharmacy contact our office and allow 72 hours for refills to be completed.    Today you received the following chemotherapy and/or immunotherapy agents: azacitidine      To help prevent nausea and vomiting after your treatment, we encourage you to take your nausea medication as directed.  BELOW ARE SYMPTOMS THAT SHOULD BE REPORTED IMMEDIATELY: *FEVER GREATER THAN 100.4 F (38 C) OR HIGHER *CHILLS OR SWEATING *NAUSEA AND VOMITING THAT IS NOT CONTROLLED WITH YOUR NAUSEA MEDICATION *UNUSUAL SHORTNESS OF BREATH *UNUSUAL BRUISING OR BLEEDING *URINARY PROBLEMS (pain or burning when urinating, or frequent urination) *BOWEL PROBLEMS (unusual diarrhea, constipation, pain near the anus) TENDERNESS IN MOUTH AND THROAT WITH OR WITHOUT PRESENCE OF ULCERS (sore throat, sores in mouth, or a toothache) UNUSUAL RASH, SWELLING OR PAIN  UNUSUAL VAGINAL DISCHARGE OR ITCHING   Items with * indicate a potential emergency and should be followed up as soon as possible or go to the Emergency Department if any problems should occur.  Please show the CHEMOTHERAPY ALERT CARD or IMMUNOTHERAPY ALERT CARD at  check-in to the Emergency Department and triage nurse.  Should you have questions after your visit or need to cancel or reschedule your appointment, please contact [facility]  Dept: [phone]  and follow the prompts.  Office hours are 8:00 a.m. to 4:30 p.m. Monday - Friday. Please note that voicemails left after 4:00 p.m. may not be returned until the following business day.  We are closed weekends and major holidays. You have access to a nurse at all times for urgent questions. Please call the main number to the clinic Dept: [phone] and follow the prompts.   For any non-urgent questions, you may also contact your provider using MyChart. We now offer e-Visits for anyone 18 and older to request care online for non-urgent symptoms. For details visit [email or web address].   Also download the MyChart app! Go to the app store, search "MyChart", open the app, select [place], and log in with your MyChart username and password.   

## 2022-11-14 ENCOUNTER — Other Ambulatory Visit: Payer: Self-pay

## 2022-11-14 ENCOUNTER — Inpatient Hospital Stay: Payer: Medicare HMO | Attending: Hematology

## 2022-11-14 VITALS — BP 116/70 | HR 85 | Temp 98.3°F | Resp 16

## 2022-11-14 DIAGNOSIS — Z5111 Encounter for antineoplastic chemotherapy: Secondary | ICD-10-CM | POA: Diagnosis not present

## 2022-11-14 DIAGNOSIS — D693 Immune thrombocytopenic purpura: Secondary | ICD-10-CM | POA: Insufficient documentation

## 2022-11-14 DIAGNOSIS — C9310 Chronic myelomonocytic leukemia not having achieved remission: Secondary | ICD-10-CM | POA: Insufficient documentation

## 2022-11-14 MED ORDER — SODIUM CHLORIDE 0.9 % IV SOLN: Freq: Once | INTRAVENOUS | Status: AC

## 2022-11-14 MED ORDER — SODIUM CHLORIDE 0.9 % IV SOLN
10.0000 mg | Freq: Once | INTRAVENOUS | Status: AC
  Administered 2022-11-14: 10 mg via INTRAVENOUS
  Filled 2022-11-14: qty 10

## 2022-11-14 MED ORDER — HEPARIN SOD (PORK) LOCK FLUSH 100 UNIT/ML IV SOLN
500.0000 [IU] | Freq: Once | INTRAVENOUS | Status: AC | PRN
  Administered 2022-11-14: 500 [IU]

## 2022-11-14 MED ORDER — SODIUM CHLORIDE 0.9% FLUSH
10.0000 mL | INTRAVENOUS | Status: DC | PRN
  Administered 2022-11-14: 10 mL

## 2022-11-14 MED ORDER — SODIUM CHLORIDE 0.9 % IV SOLN
75.0000 mg/m2 | Freq: Once | INTRAVENOUS | Status: AC
  Administered 2022-11-14: 130 mg via INTRAVENOUS
  Filled 2022-11-14: qty 13

## 2022-11-14 NOTE — Patient Instructions (Signed)
[facility]  Discharge Instructions: Thank you for choosing [facility] to provide your oncology and hematology care.   If you have a lab appointment with the Cancer Center, please go directly to the Cancer Center and check in at the registration area.   Wear comfortable clothing and clothing appropriate for easy access to any Portacath or PICC line.   We strive to give you quality time with your provider. You may need to reschedule your appointment if you arrive late (15 or more minutes).  Arriving late affects you and other patients whose appointments are after yours.  Also, if you miss three or more appointments without notifying the office, you may be dismissed from the clinic at the provider's discretion.      For prescription refill requests, have your pharmacy contact our office and allow 72 hours for refills to be completed.    Today you received the following chemotherapy and/or immunotherapy agents: Vidaza     To help prevent nausea and vomiting after your treatment, we encourage you to take your nausea medication as directed.  BELOW ARE SYMPTOMS THAT SHOULD BE REPORTED IMMEDIATELY: *FEVER GREATER THAN 100.4 F (38 C) OR HIGHER *CHILLS OR SWEATING *NAUSEA AND VOMITING THAT IS NOT CONTROLLED WITH YOUR NAUSEA MEDICATION *UNUSUAL SHORTNESS OF BREATH *UNUSUAL BRUISING OR BLEEDING *URINARY PROBLEMS (pain or burning when urinating, or frequent urination) *BOWEL PROBLEMS (unusual diarrhea, constipation, pain near the anus) TENDERNESS IN MOUTH AND THROAT WITH OR WITHOUT PRESENCE OF ULCERS (sore throat, sores in mouth, or a toothache) UNUSUAL RASH, SWELLING OR PAIN  UNUSUAL VAGINAL DISCHARGE OR ITCHING   Items with * indicate a potential emergency and should be followed up as soon as possible or go to the Emergency Department if any problems should occur.  Please show the CHEMOTHERAPY ALERT CARD or IMMUNOTHERAPY ALERT CARD at check-in  to the Emergency Department and triage nurse.  Should you have questions after your visit or need to cancel or reschedule your appointment, please contact [facility]  Dept: [phone]  and follow the prompts.  Office hours are 8:00 a.m. to 4:30 p.m. Monday - Friday. Please note that voicemails left after 4:00 p.m. may not be returned until the following business day.  We are closed weekends and major holidays. You have access to a nurse at all times for urgent questions. Please call the main number to the clinic Dept: [phone] and follow the prompts.   For any non-urgent questions, you may also contact your provider using MyChart. We now offer e-Visits for anyone 18 and older to request care online for non-urgent symptoms. For details visit [email or web address].   Also download the MyChart app! Go to the app store, search "MyChart", open the app, select [facility], and log in with your MyChart username and password.  

## 2022-11-16 ENCOUNTER — Other Ambulatory Visit: Payer: Self-pay

## 2022-11-16 ENCOUNTER — Inpatient Hospital Stay: Payer: Medicare HMO

## 2022-11-16 VITALS — BP 121/77 | HR 82 | Temp 98.9°F | Resp 16

## 2022-11-16 DIAGNOSIS — Z5111 Encounter for antineoplastic chemotherapy: Secondary | ICD-10-CM | POA: Diagnosis not present

## 2022-11-16 DIAGNOSIS — D693 Immune thrombocytopenic purpura: Secondary | ICD-10-CM | POA: Diagnosis not present

## 2022-11-16 DIAGNOSIS — Z95828 Presence of other vascular implants and grafts: Secondary | ICD-10-CM

## 2022-11-16 DIAGNOSIS — D696 Thrombocytopenia, unspecified: Secondary | ICD-10-CM

## 2022-11-16 DIAGNOSIS — C9310 Chronic myelomonocytic leukemia not having achieved remission: Secondary | ICD-10-CM | POA: Diagnosis not present

## 2022-11-16 LAB — COMPREHENSIVE METABOLIC PANEL
ALT: 24 U/L (ref 0–44)
AST: 24 U/L (ref 15–41)
Albumin: 2.9 g/dL — ABNORMAL LOW (ref 3.5–5.0)
Alkaline Phosphatase: 132 U/L — ABNORMAL HIGH (ref 38–126)
Anion gap: 3 — ABNORMAL LOW (ref 5–15)
BUN: 12 mg/dL (ref 8–23)
CO2: 28 mmol/L (ref 22–32)
Calcium: 8.1 mg/dL — ABNORMAL LOW (ref 8.9–10.3)
Chloride: 109 mmol/L (ref 98–111)
Creatinine, Ser: 0.61 mg/dL (ref 0.44–1.00)
GFR, Estimated: >60 mL/min (ref >60)
Glucose, Bld: 120 mg/dL — ABNORMAL HIGH (ref 70–99)
Potassium: 4 mmol/L (ref 3.5–5.1)
Sodium: 140 mmol/L (ref 135–145)
Total Bilirubin: 1.2 mg/dL (ref 0.3–1.2)
Total Protein: 6.4 g/dL — ABNORMAL LOW (ref 6.5–8.1)

## 2022-11-16 LAB — CBC WITH DIFFERENTIAL (CANCER CENTER ONLY)
Abs Immature Granulocytes: 1.07 10*3/uL — ABNORMAL HIGH (ref 0.00–0.07)
Basophils Absolute: 0.1 10*3/uL (ref 0.0–0.1)
Basophils Relative: 1 %
Eosinophils Absolute: 0.1 10*3/uL (ref 0.0–0.5)
Eosinophils Relative: 1 %
HCT: 28.9 % — ABNORMAL LOW (ref 36.0–46.0)
Hemoglobin: 9.6 g/dL — ABNORMAL LOW (ref 12.0–15.0)
Immature Granulocytes: 13 %
Lymphocytes Relative: 8 %
Lymphs Abs: 0.7 10*3/uL (ref 0.7–4.0)
MCH: 29.1 pg (ref 26.0–34.0)
MCHC: 33.2 g/dL (ref 30.0–36.0)
MCV: 87.6 fL (ref 80.0–100.0)
Monocytes Absolute: 1.1 10*3/uL — ABNORMAL HIGH (ref 0.1–1.0)
Monocytes Relative: 14 %
Neutro Abs: 5 10*3/uL (ref 1.7–7.7)
Neutrophils Relative %: 63 %
Platelet Count: 31 10*3/uL — ABNORMAL LOW (ref 150–400)
RBC: 3.3 MIL/uL — ABNORMAL LOW (ref 3.87–5.11)
RDW: 20.1 % — ABNORMAL HIGH (ref 11.5–15.5)
WBC Count: 8 10*3/uL (ref 4.0–10.5)
nRBC: 0 % (ref 0.0–0.2)

## 2022-11-16 LAB — SAMPLE TO BLOOD BANK

## 2022-11-16 MED ORDER — HEPARIN SOD (PORK) LOCK FLUSH 100 UNIT/ML IV SOLN
500.0000 [IU] | Freq: Once | INTRAVENOUS | Status: AC
  Administered 2022-11-16: 500 [IU]

## 2022-11-16 MED ORDER — SODIUM CHLORIDE 0.9% FLUSH
10.0000 mL | Freq: Once | INTRAVENOUS | Status: AC
  Administered 2022-11-16: 10 mL

## 2022-11-16 MED ORDER — ROMIPLOSTIM INJECTION 500 MCG
10.0000 ug/kg | Freq: Once | SUBCUTANEOUS | Status: AC
  Administered 2022-11-16: 690 ug via SUBCUTANEOUS
  Filled 2022-11-16: qty 1

## 2022-11-23 ENCOUNTER — Other Ambulatory Visit: Payer: Self-pay

## 2022-11-23 ENCOUNTER — Inpatient Hospital Stay: Payer: Medicare HMO

## 2022-11-23 VITALS — BP 108/62 | HR 76 | Temp 98.3°F | Resp 16

## 2022-11-23 DIAGNOSIS — Z95828 Presence of other vascular implants and grafts: Secondary | ICD-10-CM

## 2022-11-23 DIAGNOSIS — E538 Deficiency of other specified B group vitamins: Secondary | ICD-10-CM

## 2022-11-23 DIAGNOSIS — C9310 Chronic myelomonocytic leukemia not having achieved remission: Secondary | ICD-10-CM | POA: Diagnosis not present

## 2022-11-23 DIAGNOSIS — Z5111 Encounter for antineoplastic chemotherapy: Secondary | ICD-10-CM | POA: Diagnosis not present

## 2022-11-23 DIAGNOSIS — D696 Thrombocytopenia, unspecified: Secondary | ICD-10-CM

## 2022-11-23 DIAGNOSIS — D693 Immune thrombocytopenic purpura: Secondary | ICD-10-CM | POA: Diagnosis not present

## 2022-11-23 LAB — CBC WITH DIFFERENTIAL (CANCER CENTER ONLY)
Abs Immature Granulocytes: 1.3 10*3/uL — ABNORMAL HIGH (ref 0.00–0.07)
Basophils Absolute: 0 10*3/uL (ref 0.0–0.1)
Basophils Relative: 0 %
Eosinophils Absolute: 0.1 10*3/uL (ref 0.0–0.5)
Eosinophils Relative: 1 %
HCT: 28.8 % — ABNORMAL LOW (ref 36.0–46.0)
Hemoglobin: 9.5 g/dL — ABNORMAL LOW (ref 12.0–15.0)
Lymphocytes Relative: 9 %
Lymphs Abs: 1 10*3/uL (ref 0.7–4.0)
MCH: 29.8 pg (ref 26.0–34.0)
MCHC: 33 g/dL (ref 30.0–36.0)
MCV: 90.3 fL (ref 80.0–100.0)
Monocytes Absolute: 0.8 10*3/uL (ref 0.1–1.0)
Monocytes Relative: 7 %
Myelocytes: 11 %
Neutro Abs: 8.4 10*3/uL — ABNORMAL HIGH (ref 1.7–7.7)
Neutrophils Relative %: 72 %
Platelet Count: 32 10*3/uL — ABNORMAL LOW (ref 150–400)
RBC: 3.19 MIL/uL — ABNORMAL LOW (ref 3.87–5.11)
RDW: 20.4 % — ABNORMAL HIGH (ref 11.5–15.5)
WBC Count: 11.6 10*3/uL — ABNORMAL HIGH (ref 4.0–10.5)
nRBC: 0 % (ref 0.0–0.2)

## 2022-11-23 LAB — VITAMIN B12: Vitamin B-12: 2469 pg/mL — ABNORMAL HIGH (ref 180–914)

## 2022-11-23 LAB — FERRITIN: Ferritin: 178 ng/mL (ref 11–307)

## 2022-11-23 MED ORDER — HEPARIN SOD (PORK) LOCK FLUSH 100 UNIT/ML IV SOLN
500.0000 [IU] | Freq: Once | INTRAVENOUS | Status: AC
  Administered 2022-11-23: 500 [IU]

## 2022-11-23 MED ORDER — ROMIPLOSTIM INJECTION 500 MCG
690.0000 ug | Freq: Once | SUBCUTANEOUS | Status: AC
  Administered 2022-11-23: 690 ug via SUBCUTANEOUS
  Filled 2022-11-23: qty 1

## 2022-11-23 MED ORDER — SODIUM CHLORIDE 0.9% FLUSH
10.0000 mL | Freq: Once | INTRAVENOUS | Status: AC
  Administered 2022-11-23: 10 mL

## 2022-11-30 ENCOUNTER — Other Ambulatory Visit: Payer: Self-pay

## 2022-11-30 ENCOUNTER — Inpatient Hospital Stay: Payer: Medicare HMO

## 2022-11-30 VITALS — BP 109/60 | HR 73 | Temp 98.3°F | Resp 16

## 2022-11-30 DIAGNOSIS — D693 Immune thrombocytopenic purpura: Secondary | ICD-10-CM | POA: Diagnosis not present

## 2022-11-30 DIAGNOSIS — D696 Thrombocytopenia, unspecified: Secondary | ICD-10-CM

## 2022-11-30 DIAGNOSIS — Z5111 Encounter for antineoplastic chemotherapy: Secondary | ICD-10-CM | POA: Diagnosis not present

## 2022-11-30 DIAGNOSIS — Z95828 Presence of other vascular implants and grafts: Secondary | ICD-10-CM

## 2022-11-30 DIAGNOSIS — C9310 Chronic myelomonocytic leukemia not having achieved remission: Secondary | ICD-10-CM | POA: Diagnosis not present

## 2022-11-30 LAB — CBC WITH DIFFERENTIAL (CANCER CENTER ONLY)
Abs Immature Granulocytes: 0.12 10*3/uL — ABNORMAL HIGH (ref 0.00–0.07)
Basophils Absolute: 0 10*3/uL (ref 0.0–0.1)
Basophils Relative: 1 %
Eosinophils Absolute: 0.1 10*3/uL (ref 0.0–0.5)
Eosinophils Relative: 2 %
HCT: 28.6 % — ABNORMAL LOW (ref 36.0–46.0)
Hemoglobin: 9.6 g/dL — ABNORMAL LOW (ref 12.0–15.0)
Immature Granulocytes: 2 %
Lymphocytes Relative: 9 %
Lymphs Abs: 0.6 10*3/uL — ABNORMAL LOW (ref 0.7–4.0)
MCH: 30.4 pg (ref 26.0–34.0)
MCHC: 33.6 g/dL (ref 30.0–36.0)
MCV: 90.5 fL (ref 80.0–100.0)
Monocytes Absolute: 1.7 10*3/uL — ABNORMAL HIGH (ref 0.1–1.0)
Monocytes Relative: 28 %
Neutro Abs: 3.6 10*3/uL (ref 1.7–7.7)
Neutrophils Relative %: 58 %
Platelet Count: 44 10*3/uL — ABNORMAL LOW (ref 150–400)
RBC: 3.16 MIL/uL — ABNORMAL LOW (ref 3.87–5.11)
RDW: 20.7 % — ABNORMAL HIGH (ref 11.5–15.5)
WBC Count: 6.1 10*3/uL (ref 4.0–10.5)
nRBC: 0 % (ref 0.0–0.2)

## 2022-11-30 LAB — COMPREHENSIVE METABOLIC PANEL
ALT: 20 U/L (ref 0–44)
AST: 26 U/L (ref 15–41)
Albumin: 2.8 g/dL — ABNORMAL LOW (ref 3.5–5.0)
Alkaline Phosphatase: 144 U/L — ABNORMAL HIGH (ref 38–126)
Anion gap: 4 — ABNORMAL LOW (ref 5–15)
BUN: 12 mg/dL (ref 8–23)
CO2: 26 mmol/L (ref 22–32)
Calcium: 8 mg/dL — ABNORMAL LOW (ref 8.9–10.3)
Chloride: 111 mmol/L (ref 98–111)
Creatinine, Ser: 0.53 mg/dL (ref 0.44–1.00)
GFR, Estimated: >60 mL/min (ref >60)
Glucose, Bld: 93 mg/dL (ref 70–99)
Potassium: 4 mmol/L (ref 3.5–5.1)
Sodium: 141 mmol/L (ref 135–145)
Total Bilirubin: 1.5 mg/dL — ABNORMAL HIGH (ref 0.3–1.2)
Total Protein: 6 g/dL — ABNORMAL LOW (ref 6.5–8.1)

## 2022-11-30 LAB — SAMPLE TO BLOOD BANK

## 2022-11-30 MED ORDER — HEPARIN SOD (PORK) LOCK FLUSH 100 UNIT/ML IV SOLN
500.0000 [IU] | Freq: Once | INTRAVENOUS | Status: AC
  Administered 2022-11-30: 500 [IU]

## 2022-11-30 MED ORDER — SODIUM CHLORIDE 0.9% FLUSH
10.0000 mL | Freq: Once | INTRAVENOUS | Status: AC
  Administered 2022-11-30: 10 mL

## 2022-11-30 MED ORDER — ROMIPLOSTIM INJECTION 500 MCG
690.0000 ug | Freq: Once | SUBCUTANEOUS | Status: AC
  Administered 2022-11-30: 690 ug via SUBCUTANEOUS
  Filled 2022-11-30: qty 1

## 2022-12-01 DIAGNOSIS — C9310 Chronic myelomonocytic leukemia not having achieved remission: Secondary | ICD-10-CM | POA: Diagnosis not present

## 2022-12-01 DIAGNOSIS — R768 Other specified abnormal immunological findings in serum: Secondary | ICD-10-CM | POA: Diagnosis not present

## 2022-12-01 DIAGNOSIS — D696 Thrombocytopenia, unspecified: Secondary | ICD-10-CM | POA: Diagnosis not present

## 2022-12-01 DIAGNOSIS — C9311 Chronic myelomonocytic leukemia, in remission: Secondary | ICD-10-CM | POA: Diagnosis not present

## 2022-12-04 NOTE — Progress Notes (Unsigned)
Patient Care Team: Natalia Leatherwood, DO as PCP - General (Family Medicine) Myrtie Neither Andreas Blower, MD as Consulting Physician (Gastroenterology) Malachy Mood, MD as Consulting Physician (Hematology) Glendale Chard, DO as Consulting Physician (Neurology)   CHIEF COMPLAINT: Follow up [name] and thrombocytopenia   Oncology History  [name] (chronic myelomonocytic leukemia) (HCC)  10/29/2020 Imaging   CT CAP  IMPRESSION: 1. Splenomegaly with pathologically enlarged lymph nodes above and below the diaphragm, with overall stable to minimally increased abdominal adenopathy and interval progression of the pelvic adenopathy. 2. Small volume abdominopelvic ascites with diffuse mesenteric stranding. 3. Scattered bilateral pulmonary micro nodules measuring 1-2 mm. 4. Distended gallbladder with some layering hyperdense material representing layering sludge and tiny stones seen on prior ultrasound. 5. Aortic atherosclerosis.   10/30/2020 Pathology Results   DIAGNOSIS:   BONE MARROW, ASPIRATE, CLOT, CORE:  -  Hypercellular bone marrow with panhyperplasia, atypia, and no  increase in blasts  -  See comment   PERIPHERAL BLOOD:  -  Marked thrombocytopenia  -  Absolute monocytosis  -  Normocytic anemia  -  See CBC data and comment   COMMENT:  The bone marrow is hypercellular for age (approximately 80%) with myeloid hyperplasia with maturational left shift, erythroid hyperplasia, and increased megakaryocytes.  Mild multilineage atypia is present. Blasts are not increased on aspirate smears (1% by manual differential count) or by CD34 immunohistochemistry on the core biopsy.  Concurrent flow cytometric analysis of the bone marrow aspirate demonstrates increased monocytes, and no increase in blasts or abnormal lymphoid population (see [id number]).  Monocytes are also increased in peripheral  blood, persistent per electronic medical record.  In aggregate, the  findings raise the possibility of a myeloid  neoplasm with the  differential diagnosis including a low-grade myelodysplastic syndrome and chronic myelomonocytic leukemia (dysplastic type).    ADDENDUM:  A reticulin special stain performed on the bone marrow core biopsy reveals no significant increase in reticulin fibrosis.  ADDENDUM:  CYTOGENETIC RESULTS:  Karyotype: 46,XX[20]  Interpretation: NORMAL FEMALE KARYOTYPE   FISH RESULTS:  Results: NORMAL   ADDENDUM:  CD123 immunohistochemistry performed on the core biopsy highlights scattered aggregates of positively staining cells consistent with plasmacytoid dendritic cells.    10/30/2020 Pathology Results   DIAGNOSIS:   BONE MARROW; FLOW CYTOMETRIC ANALYSIS:  -  Increased monocytes  -  Scant B-cells present  -  No immunophenotypically aberrant T-cell population identified  -  No increase in blasts  -  See comment   COMMENT:  Monocytes are relatively increased (12% of all cells), without aberrant expression of CD56.  B-cells comprise <1% of total lymphocytes. CD34-positive blasts are not increased (<1% of all cells).  Correlation with concurrent morphology is recommended for complete diagnostic interpretation and overall blast enumeration (see [id number]).    10/30/2020 Pathology Results   FINAL MICROSCOPIC DIAGNOSIS:   A. LYMPH NODE, RIGHT AXILLARY, NEEDLE CORE BIOPSY:  -Lymphoid tissue present  -See comment   COMMENT:  The sections show several small needle core biopsy fragments of lymphoid tissue displaying degenerative cellular changes/necrosis and hence cannot be accurately evaluated.  Sample for flow cytometric analysis not available.  Immunohistochemical stain for CD3 and CD20 were performed with appropriate controls.  There is a mixture of T and B cells in their apparently respective compartments.  There is no definite metastatic malignancy.    11/11/2020 Pathology Results   DIAGNOSIS:   LEFT AXILLARY LYMPH NODE EXCISIONAL BIOPSY; FLOW CYTOMETRIC ANALYSIS:  -  No  monotypic  B-cell or immunophenotypically aberrant T-cell  population identified  -  See comment   COMMENT:  Flow cytometric analysis identifies B-cells with a normal kappa:lambda ratio of 1.7:1.  A subset of the polytypic B-cells expresses CD10. T-cells show a CD4:CD8 ratio of 3.3:1 without immunophenotypic aberrancy with the markers evaluated.  Although these results do not support the diagnosis of a clonal lymphoid population, sampling issues must always be considered when negative results are obtained, as focal lesions may not be represented in the specimen submitted.  In addition, flow cytometric immunophenotyping will not exclude other pathology if present (e.g. Hodgkin lymphoma, some T-cell lymphomas, metastatic and infectious diseases).   11/11/2020 Pathology Results   FINAL MICROSCOPIC DIAGNOSIS:   A. LYMPH NODE, LEFT AXILLARY, DISSECTION:  -  Follicular hyperplasia with interfollicular expansion  -  See comment   COMMENT:  Sections of the lymph nodes reveal generally preserved lymph node architecture.  There is follicular hyperplasia with some follicles showing increased hyalinization of germinal centers and mild concentric encircling of germinal centers with small lymphocytes (onion skinning). Interfollicular areas are expanded with increased vascular proliferation, small lymphocytes, plasma cells, histiocytic cells, and patchy neutrophils.  The lymph node capsule is variably thickened by fibrosis with few plasma cells.   A panel of immunohistochemical stains is performed for further  characterization.  CD3 and CD20/PAX5 highlight T-cell and B-cell  compartments, respectively.  Germinal centers are highlighted by CD10 and BCL6 with appropriate absent expression of BCL2.  CD5 is similar to CD3.  CD43 also stains the T-cells.  CD4-positive T-cells exceed CD8-positive T-cells.  CD30 highlights scattered immunoblasts.  CD21 reveals intact follicular dendritic cell meshworks.  CD68 highlights  increased histiocytic cells.  HHV 8 is negative.  T. pallidum reveals no definitive organisms.  Pancytokeratin is negative.  TdT stains rare cells.  CD138 highlights plasma cells which are not increased in number and show polytypic light chain expression by kappa/lambda in situ  hybridization.  EBV is negative by in situ hybridization.   Concurrent flow cytometric analysis is negative for a monoclonal B-cell or immunophenotypically aberrant T-cell population (see [phone]).   Together, the findings above are non-specific and can be seen in  reactive and infectious processes as well as Castleman disease.  There is no definitive morphologic or flow cytometric evidence of involvement by a lymphoproliferative disorder from the current workup.   12/17/2020 Initial Diagnosis   [name] (chronic myelomonocytic leukemia) (HCC)   12/28/2020 - 02/03/2022 Chemotherapy   Patient is on Treatment Plan : MYELODYSPLASIA  Azacitidine IV D1-5 q28d     03/18/2021 Pathology Results   Final Diagnosis    BONE MARROW:             Persistent chronic myelomonocytic leukemia in a hypercellular bone marrow with increased monocytes and no increase in blasts.   Comment    Flow cytometric analysis showed no increase in blasts and 17% monocytes.A next generation myeloid panel showed the presence of the following mutations: NRAS, SH2B3, PHF6 and TET2. CD34 and CD117 immunstains do not show an increase in blasts. The findings are consistent with persistent [name] with a decrease in the number of blasts compared to that seen on the previous bone marrow.    Final Interpretation      BONE MARROW; FLOW CYTOMETRIC ANALYSIS:   No increased blasts identified. (see comment)  Monocytosis (17% of total events).    COMMENT: Flow cytometry identified about 0.6% of total events as immature cells. These cells express CD45 (dim), CD34,  CD117, HLA-DR, CD38, CD33 (variable). This immunophenotype is consistent with normal myeloblasts. In  addition, monocytes are increased and comprise of approximately 17% of total events. These monocytes show normal expression of CD33/CD64/CD14/CD11b with variable expression of CD4. Correlation with concurrent morphology and clinical data is recommended (see [id number]).    FLOW CYTOMETRY ANALYSIS: CD45 versus side scatter analysis demonstrate a predominance of granulocytes (~70%), monocytes (~17%) and lymphocytes (~6%). No significant increase in blasts identified. All tested markers were used for adequate analysis of the cells and were appropriately reviewed.     12/28/2021 -  Chemotherapy   Patient is on Treatment Plan : MYELODYSPLASIA  Azacitidine IV D1-5 q28d        CURRENT THERAPY:  Azacitidine days 1-5 q28 days, started 12/28/20 -Nplate 59mcg/kg weekly -Platelet transfusion as needed (plt<15 -20K), blood transfusion if Hg<8.0  INTERVAL HISTORY Ms. Olvera returns for follow up as scheduled. Last seen by me 11/08/22 and completed C12 Vidaza, and continues weekly Nplate. Saw Dr. Sharyne Richters last week who recommended to continue full dose vidaza. Last cycle she had day 5 after the weekend and felt sick when treatment days are broken up like that. Felt like she had the flu and was vomiting. She used zofran 4 mg odt PRN, did not try compazine needs a refill. She recovered eventually.   ROS  All other systems reviewed and negative   Past Medical History:  Diagnosis Date   Acute bronchitis due to COVID-19 virus 05/16/2022   Diabetes (HCC)    HLD (hyperlipidemia)    Hypertension    Pernicious anemia    Pneumonia 09/05/2019   Renal insufficiency 09/06/2019   Vitamin D deficiency      Past Surgical History:  Procedure Laterality Date   ABDOMINAL HERNIA REPAIR  2009   ABDOMINOPLASTY     AXILLARY LYMPH NODE BIOPSY Left 11/11/2020   Procedure: LEFT AXILLARY EXCISIONAL LYMPH NODE BIOPSY;  Surgeon: Darnell Level, MD;  Location: [facility] OR;  Service: General;  Laterality: Left;   CESAREAN SECTION  1978    GASTRIC BYPASS  2000   hemorroid surgery  2007   IR IMAGING GUIDED PORT INSERTION  07/26/2021   IR KYPHO EA ADDL LEVEL THORACIC OR LUMBAR  05/31/2021   IR KYPHO THORACIC WITH BONE BIOPSY  05/31/2021   TONSILLECTOMY AND ADENOIDECTOMY  1957     Outpatient Encounter Medications as of 12/07/2022  Medication Sig Note   cyanocobalamin (,VITAMIN B-12,) 1000 MCG/ML injection Inject 1,000 mcg into the muscle every 30 (thirty) days.    cyclobenzaprine (FLEXERIL) 5 MG tablet Take 1 tablet (5 mg total) by mouth 3 (three) times daily as needed for muscle spasms.    furosemide (LASIX) 20 MG tablet TAKE 1 TABLET BY MOUTH EVERY DAY FOR UP TO 7 DAYS THEN AS NEEDED FOR LEG EDEMA    glucose blood (ONETOUCH VERIO) test strip Monitor blood sugars twice daily    KLOR-CON M10 10 MEQ tablet TAKE 1 TABLET BY MOUTH TWICE A DAY TAKE ALONG WITH FUROSEMIDE ONLY    Lancets (ONETOUCH ULTRASOFT) lancets Monitor blood sugars twice daily    lidocaine (HM LIDOCAINE PATCH) 4 % Place 2 patches onto the skin daily.    lidocaine-prilocaine (EMLA) cream APPLY 2 GRAMS TO PORT-A-CATH SITE 30-60 MINUTES PRIOR TO PORT ACCESS AS NEEDED    LORazepam (ATIVAN) 1 MG tablet TAKE 1 TABLET BY MOUTH EVERY 8 HOURS AS NEEDED FOR ANXIETY    metoprolol succinate (TOPROL-XL) 25 MG 24 hr tablet Take 1 tablet (25  mg total) by mouth daily.    naloxone (NARCAN) nasal spray 4 mg/0.1 mL Place 1 spray into the nose once as needed (overdose). 07/26/2021: never   omeprazole (PRILOSEC) 20 MG capsule Take 20 mg by mouth daily.    Oxycodone HCl 10 MG TABS Take 1 tablet (10 mg total) by mouth 2 (two) times daily as needed.    Oxycodone HCl 10 MG TABS Take 1 tablet (10 mg total) by mouth 2 (two) times daily as needed.    Oxycodone HCl 10 MG TABS Take 1 tablet (10 mg total) by mouth 2 (two) times daily as needed.    polyethylene glycol powder (GLYCOLAX/MIRALAX) 17 GM/SCOOP powder Dissolve 1 capful (17 g) in water and drink 2 (two) times daily. (Patient taking  differently: Take 17 g by mouth daily.)    pregabalin (LYRICA) 150 MG capsule Take 1 capsule (150 mg total) by mouth 2 (two) times daily.    promethazine-dextromethorphan (PROMETHAZINE-DM) 6.25-15 MG/5ML syrup Take 5 mLs by mouth 4 (four) times daily as needed.    senna-docusate (SENOKOT-S) 8.6-50 MG tablet Take 2 tablets by mouth at bedtime as needed for mild constipation.    sitaGLIPtin-metformin (JANUMET) 50-1000 MG tablet Take 1 tablet by mouth at bedtime.    [DISCONTINUED] ondansetron (ZOFRAN-ODT) 4 MG disintegrating tablet Take 1 tablet (4 mg total) by mouth every 8 (eight) hours as needed for nausea or vomiting.    [DISCONTINUED] prochlorperazine (COMPAZINE) 10 MG tablet Take 10 mg by mouth every 6 (six) hours as needed.    ondansetron (ZOFRAN-ODT) 8 MG disintegrating tablet Take 0.5 tablets (4 mg total) by mouth every 8 (eight) hours as needed for nausea or vomiting.    prochlorperazine (COMPAZINE) 10 MG tablet Take 1 tablet (10 mg total) by mouth every 6 (six) hours as needed.    Facility-Administered Encounter Medications as of 12/07/2022  Medication   0.9 %  sodium chloride infusion (Manually program via Guardrails IV Fluids)     Today's Vitals   12/07/22 0856  BP: (!) 119/57  Pulse: 93  Resp: 18  Temp: 98.3 F (36.8 C)  SpO2: 98%  Weight: 153 lb 12.8 oz (69.8 kg)   Body mass index is 24.82 kg/m.   PHYSICAL EXAM GENERAL:alert, no distress and comfortable SKIN: no rash  EYES: sclera clear LUNGS: clear with normal breathing effort HEART: regular rate & rhythm, murmur noted, no lower extremity edema NEURO: alert & oriented x 3 with fluent speech PAC without erythema    CBC    Component Value Date/Time   WBC 5.4 12/07/2022 0834   WBC 4.7 09/19/2022 1722   RBC 3.16 (L) 12/07/2022 0834   HGB 10.0 (L) 12/07/2022 0834   HGB 11.4 (L) 07/23/2015 0910   HCT 29.2 (L) 12/07/2022 0834   HCT 34.9 07/23/2015 0910   PLT 73 (L) 12/07/2022 0834   PLT 103 (L) 07/23/2015 0910    MCV 92.4 12/07/2022 0834   MCV 87.5 07/23/2015 0910   MCH 31.6 12/07/2022 0834   MCHC 34.2 12/07/2022 0834   RDW 20.5 (H) 12/07/2022 0834   RDW 14.8 (H) 07/23/2015 0910   LYMPHSABS 0.5 (L) 12/07/2022 0834   LYMPHSABS 3.1 07/23/2015 0910   MONOABS 1.3 (H) 12/07/2022 0834   MONOABS 2.5 (H) 07/23/2015 0910   EOSABS 0.0 12/07/2022 0834   EOSABS 0.0 07/23/2015 0910   BASOSABS 0.1 12/07/2022 0834   BASOSABS 0.0 07/23/2015 0910     CMP     Component Value Date/Time   NA  140 12/07/2022 0834   NA 137 02/18/2021 0000   K 3.6 12/07/2022 0834   CL 107 12/07/2022 0834   CO2 24 12/07/2022 0834   GLUCOSE 170 (H) 12/07/2022 0834   BUN 13 12/07/2022 0834   BUN 13 02/18/2021 0000   CREATININE 0.68 12/07/2022 0834   CALCIUM 7.9 (L) 12/07/2022 0834   PROT 6.0 (L) 11/30/2022 0744   ALBUMIN 2.8 (L) 11/30/2022 0744   AST 26 11/30/2022 0744   AST 28 07/27/2022 1405   ALT 20 11/30/2022 0744   ALT 31 07/27/2022 1405   ALKPHOS 144 (H) 11/30/2022 0744   BILITOT 1.5 (H) 11/30/2022 0744   BILITOT 1.4 (H) 07/27/2022 1405   GFRNONAA >60 12/07/2022 0834   GFRAA 45 (L) 09/07/2019 0350   GFRAA >60 11/20/2017 0924     ASSESSMENT & PLAN:Jackqueline M Sloss is a 72 y.o. female with   1. [name] with severe thrombocytopenia  -Initially diagnosed with ITP in 2014. She lost follow up after 11/2017 and did not proceed with recommended bone marrow biopsy. -She was referred to ED 10/28/20 by her PCP for low plt count of 4k. She was hospitalized and treated with platelet transfusions, IVIG, oral prednisone 60mg  and Nplate injections. She tapered off prednisone given little response. -Bone marrow biopsy 10/30/20 felt to likely represent MDS/MPN, particularly [name].  Confirmed on slide review at [facility] by Dr. Sharyne Richters -She began treatment for severe refractory thrombocytopenia with weekly Rituxan x4 on 11/19/20, she did not respond  -She began azacitidine daily days 1-5 q. 28 days on 12/28/2020 -She had worsening  thrombocytopenia after stopping Nplate, restarted weekly Nplate 10 mcg/kg on 01/04/2021.  Dose adjustment per platelet level, currently 10 mcg/kg -Bone marrow biopsy 03/18/2021 showed stable disease, no increase in blasts. -She continues azacitidine daily days 1-5 q. 28 days; and weekly Nplate; s/p C12 which was increased to full dose 75 mg/m2. I reviewed Dr. Brown Human notes -Ms. Liwanag appears stable. Due to the scheduling (Tu-Fri/Monday dosing), she was sick on day 5. I increased zofran and refilled compaze for her to add/alternate. Unfortunately this cycle is broken up as well (3 days this week and 2 days next week) due to the holiday.  -Labs reviewed, plts improved 73K, otherwise CBC/CMP stable and adequate for treatment. She will resume daily calcium -Proceed with Vidaza daily x5 days as scheduled, weekly lab and Nplate -F/up in 4 weeks with next cycle   2.  T7/T8 compression fracture -Platelet count improved, she was able to undergo T10 and T12 kyphoplasty 05/31/2021  -No longer using thoracic brace -She has not been able to find a dentist who takes Medicare for Zometa clearance, I previously referred her to Richrd Humbles, DDS. She has not called yet but has their # -Zometa remains on hold -Was taking calcium but ran out, she will restart   3. Normocytic anemia, moderate  -H/o pernicious anemia/vitamin B12 deficiency -Transfuse for Hgb < 8 -Continue oral iron -B12 >3000 lately, last dose in 08/2022. Will hold injection for now, until B12 normalizes then resume. She is not taking oral B12   4. Type 2 diabetes mellitus with polyneuropathy, Hypertension, anxiety, edema -Continue medication, follow-up PCP -lasix PRN with K, takes ~once per week -Ativan as needed      PLAN: -Labs reviewed, plts improved 73K, otherwise CBC/CMP stable.  -Resume daily calcium -Proceed with Vidaza daily x5 days as scheduled, full dose -Weekly lab and Nplate -F/up in 4 weeks with next cycle -Increased zofran  and  add compazine    All questions were answered. The patient knows to call the clinic with any problems, questions or concerns. No barriers to learning were detected. I spent 20 minutes counseling the patient face to face. The total time spent in the appointment was 30 minutes and more than 50% was on counseling, review of test results, and coordination of care.   Santiago Glad, NP-C 12/07/2022

## 2022-12-06 ENCOUNTER — Telehealth: Payer: Self-pay | Admitting: *Deleted

## 2022-12-06 ENCOUNTER — Other Ambulatory Visit: Payer: Self-pay

## 2022-12-06 MED FILL — Dexamethasone Sodium Phosphate Inj 100 MG/10ML: INTRAMUSCULAR | Qty: 1 | Status: AC

## 2022-12-06 NOTE — Progress Notes (Signed)
  Care Coordination   Note   12/06/2022 Name: Kathleen Gibson MRN: [id number] DOB: January 24, 1951  Kathleen Gibson is a 72 y.o. year old female who sees Kuneff, Renee A, DO for primary care. I reached out to Judene Companion by phone today to offer care coordination services.  Ms. Blough was given information about Care Coordination services today including:   The Care Coordination services include support from the care team which includes your Nurse Coordinator, Clinical Social Worker, or Pharmacist.  The Care Coordination team is here to help remove barriers to the health concerns and goals most important to you. Care Coordination services are voluntary, and the patient may decline or stop services at any time by request to their care team member.   Care Coordination Consent Status: Patient did not agree to participate in care coordination services at this time.  Follow up plan:  none - pt appreciative but declines services at this time with many upcoming appt   Encounter Outcome:  Pt. Refused  Burman Nieves, CCMA Care Coordination Care Guide Direct Dial: [phone]

## 2022-12-07 ENCOUNTER — Inpatient Hospital Stay (HOSPITAL_BASED_OUTPATIENT_CLINIC_OR_DEPARTMENT_OTHER): Payer: Medicare HMO | Admitting: Nurse Practitioner

## 2022-12-07 ENCOUNTER — Other Ambulatory Visit: Payer: Self-pay

## 2022-12-07 ENCOUNTER — Inpatient Hospital Stay: Payer: Medicare HMO

## 2022-12-07 ENCOUNTER — Encounter: Payer: Self-pay | Admitting: Nurse Practitioner

## 2022-12-07 VITALS — BP 119/57 | HR 93 | Temp 98.3°F | Resp 18 | Wt 153.8 lb

## 2022-12-07 DIAGNOSIS — Z5111 Encounter for antineoplastic chemotherapy: Secondary | ICD-10-CM | POA: Diagnosis not present

## 2022-12-07 DIAGNOSIS — C9310 Chronic myelomonocytic leukemia not having achieved remission: Secondary | ICD-10-CM

## 2022-12-07 DIAGNOSIS — D696 Thrombocytopenia, unspecified: Secondary | ICD-10-CM | POA: Diagnosis not present

## 2022-12-07 DIAGNOSIS — Z95828 Presence of other vascular implants and grafts: Secondary | ICD-10-CM

## 2022-12-07 DIAGNOSIS — D693 Immune thrombocytopenic purpura: Secondary | ICD-10-CM | POA: Diagnosis not present

## 2022-12-07 LAB — BASIC METABOLIC PANEL - CANCER CENTER ONLY
Anion gap: 9 (ref 5–15)
BUN: 13 mg/dL (ref 8–23)
CO2: 24 mmol/L (ref 22–32)
Calcium: 7.9 mg/dL — ABNORMAL LOW (ref 8.9–10.3)
Chloride: 107 mmol/L (ref 98–111)
Creatinine: 0.68 mg/dL (ref 0.44–1.00)
GFR, Estimated: >60 mL/min (ref >60)
Glucose, Bld: 170 mg/dL — ABNORMAL HIGH (ref 70–99)
Potassium: 3.6 mmol/L (ref 3.5–5.1)
Sodium: 140 mmol/L (ref 135–145)

## 2022-12-07 LAB — CBC WITH DIFFERENTIAL (CANCER CENTER ONLY)
Abs Immature Granulocytes: 0.08 10*3/uL — ABNORMAL HIGH (ref 0.00–0.07)
Basophils Absolute: 0.1 10*3/uL (ref 0.0–0.1)
Basophils Relative: 1 %
Eosinophils Absolute: 0 10*3/uL (ref 0.0–0.5)
Eosinophils Relative: 0 %
HCT: 29.2 % — ABNORMAL LOW (ref 36.0–46.0)
Hemoglobin: 10 g/dL — ABNORMAL LOW (ref 12.0–15.0)
Immature Granulocytes: 2 %
Lymphocytes Relative: 9 %
Lymphs Abs: 0.5 10*3/uL — ABNORMAL LOW (ref 0.7–4.0)
MCH: 31.6 pg (ref 26.0–34.0)
MCHC: 34.2 g/dL (ref 30.0–36.0)
MCV: 92.4 fL (ref 80.0–100.0)
Monocytes Absolute: 1.3 10*3/uL — ABNORMAL HIGH (ref 0.1–1.0)
Monocytes Relative: 25 %
Neutro Abs: 3.5 10*3/uL (ref 1.7–7.7)
Neutrophils Relative %: 63 %
Platelet Count: 73 10*3/uL — ABNORMAL LOW (ref 150–400)
RBC: 3.16 MIL/uL — ABNORMAL LOW (ref 3.87–5.11)
RDW: 20.5 % — ABNORMAL HIGH (ref 11.5–15.5)
WBC Count: 5.4 10*3/uL (ref 4.0–10.5)
nRBC: 0 % (ref 0.0–0.2)

## 2022-12-07 MED ORDER — ROMIPLOSTIM INJECTION 500 MCG
10.0000 ug/kg | Freq: Once | SUBCUTANEOUS | Status: AC
  Administered 2022-12-07: 700 ug via SUBCUTANEOUS
  Filled 2022-12-07: qty 1

## 2022-12-07 MED ORDER — SODIUM CHLORIDE 0.9% FLUSH
10.0000 mL | INTRAVENOUS | Status: DC | PRN
  Administered 2022-12-07: 10 mL

## 2022-12-07 MED ORDER — HEPARIN SOD (PORK) LOCK FLUSH 100 UNIT/ML IV SOLN
500.0000 [IU] | Freq: Once | INTRAVENOUS | Status: AC | PRN
  Administered 2022-12-07: 500 [IU]

## 2022-12-07 MED ORDER — SODIUM CHLORIDE 0.9% FLUSH
10.0000 mL | Freq: Once | INTRAVENOUS | Status: AC
  Administered 2022-12-07: 10 mL

## 2022-12-07 MED ORDER — SODIUM CHLORIDE 0.9 % IV SOLN
10.0000 mg | Freq: Once | INTRAVENOUS | Status: AC
  Administered 2022-12-07: 10 mg via INTRAVENOUS
  Filled 2022-12-07: qty 10

## 2022-12-07 MED ORDER — PROCHLORPERAZINE MALEATE 10 MG PO TABS: 10.0000 mg | ORAL_TABLET | Freq: Four times a day (QID) | ORAL | 3 refills | Status: DC | PRN

## 2022-12-07 MED ORDER — ONDANSETRON 8 MG PO TBDP: 4.0000 mg | ORAL_TABLET | Freq: Three times a day (TID) | ORAL | 3 refills | Status: DC | PRN

## 2022-12-07 MED ORDER — SODIUM CHLORIDE 0.9 % IV SOLN: Freq: Once | INTRAVENOUS | Status: AC

## 2022-12-07 MED ORDER — SODIUM CHLORIDE 0.9 % IV SOLN
75.0000 mg/m2 | Freq: Once | INTRAVENOUS | Status: AC
  Administered 2022-12-07: 130 mg via INTRAVENOUS
  Filled 2022-12-07: qty 13

## 2022-12-07 MED FILL — Dexamethasone Sodium Phosphate Inj 100 MG/10ML: INTRAMUSCULAR | Qty: 1 | Status: AC

## 2022-12-07 NOTE — Progress Notes (Signed)
Patient seen by Santiago Glad, NP  Vitals are within treatment parameters.  Labs reviewed: Plts 73  Per physician team, patient is ready for treatment and there are NO modifications to the treatment plan. Please give Nplate with vidaza

## 2022-12-07 NOTE — Patient Instructions (Signed)
[facility]  Discharge Instructions: Thank you for choosing [facility] to provide your oncology and hematology care.   If you have a lab appointment with the Cancer Center, please go directly to the Cancer Center and check in at the registration area.   Wear comfortable clothing and clothing appropriate for easy access to any Portacath or PICC line.   We strive to give you quality time with your provider. You may need to reschedule your appointment if you arrive late (15 or more minutes).  Arriving late affects you and other patients whose appointments are after yours.  Also, if you miss three or more appointments without notifying the office, you may be dismissed from the clinic at the provider's discretion.      For prescription refill requests, have your pharmacy contact our office and allow 72 hours for refills to be completed.    Today you received the following chemotherapy and/or immunotherapy agents: Vidaza     To help prevent nausea and vomiting after your treatment, we encourage you to take your nausea medication as directed.  BELOW ARE SYMPTOMS THAT SHOULD BE REPORTED IMMEDIATELY: *FEVER GREATER THAN 100.4 F (38 C) OR HIGHER *CHILLS OR SWEATING *NAUSEA AND VOMITING THAT IS NOT CONTROLLED WITH YOUR NAUSEA MEDICATION *UNUSUAL SHORTNESS OF BREATH *UNUSUAL BRUISING OR BLEEDING *URINARY PROBLEMS (pain or burning when urinating, or frequent urination) *BOWEL PROBLEMS (unusual diarrhea, constipation, pain near the anus) TENDERNESS IN MOUTH AND THROAT WITH OR WITHOUT PRESENCE OF ULCERS (sore throat, sores in mouth, or a toothache) UNUSUAL RASH, SWELLING OR PAIN  UNUSUAL VAGINAL DISCHARGE OR ITCHING   Items with * indicate a potential emergency and should be followed up as soon as possible or go to the Emergency Department if any problems should occur.  Please show the CHEMOTHERAPY ALERT CARD or IMMUNOTHERAPY ALERT CARD at check-in  to the Emergency Department and triage nurse.  Should you have questions after your visit or need to cancel or reschedule your appointment, please contact [facility]  Dept: [phone]  and follow the prompts.  Office hours are 8:00 a.m. to 4:30 p.m. Monday - Friday. Please note that voicemails left after 4:00 p.m. may not be returned until the following business day.  We are closed weekends and major holidays. You have access to a nurse at all times for urgent questions. Please call the main number to the clinic Dept: [phone] and follow the prompts.   For any non-urgent questions, you may also contact your provider using MyChart. We now offer e-Visits for anyone 18 and older to request care online for non-urgent symptoms. For details visit [email or web address].   Also download the MyChart app! Go to the app store, search "MyChart", open the app, select [facility], and log in with your MyChart username and password.  

## 2022-12-07 NOTE — Patient Instructions (Signed)

## 2022-12-08 ENCOUNTER — Other Ambulatory Visit: Payer: Self-pay

## 2022-12-08 ENCOUNTER — Inpatient Hospital Stay: Payer: Medicare HMO

## 2022-12-08 VITALS — BP 101/55 | HR 76 | Temp 97.9°F | Resp 17

## 2022-12-08 DIAGNOSIS — C9310 Chronic myelomonocytic leukemia not having achieved remission: Secondary | ICD-10-CM

## 2022-12-08 DIAGNOSIS — D693 Immune thrombocytopenic purpura: Secondary | ICD-10-CM | POA: Diagnosis not present

## 2022-12-08 DIAGNOSIS — Z5111 Encounter for antineoplastic chemotherapy: Secondary | ICD-10-CM | POA: Diagnosis not present

## 2022-12-08 MED ORDER — SODIUM CHLORIDE 0.9% FLUSH
10.0000 mL | INTRAVENOUS | Status: DC | PRN
  Administered 2022-12-08: 10 mL

## 2022-12-08 MED ORDER — SODIUM CHLORIDE 0.9 % IV SOLN: Freq: Once | INTRAVENOUS | Status: AC

## 2022-12-08 MED ORDER — HEPARIN SOD (PORK) LOCK FLUSH 100 UNIT/ML IV SOLN
500.0000 [IU] | Freq: Once | INTRAVENOUS | Status: AC | PRN
  Administered 2022-12-08: 500 [IU]

## 2022-12-08 MED ORDER — SODIUM CHLORIDE 0.9 % IV SOLN
75.0000 mg/m2 | Freq: Once | INTRAVENOUS | Status: AC
  Administered 2022-12-08: 130 mg via INTRAVENOUS
  Filled 2022-12-08: qty 13

## 2022-12-08 MED ORDER — SODIUM CHLORIDE 0.9 % IV SOLN
10.0000 mg | Freq: Once | INTRAVENOUS | Status: AC
  Administered 2022-12-08: 10 mg via INTRAVENOUS
  Filled 2022-12-08: qty 10

## 2022-12-08 MED FILL — Dexamethasone Sodium Phosphate Inj 100 MG/10ML: INTRAMUSCULAR | Qty: 1 | Status: AC

## 2022-12-09 ENCOUNTER — Inpatient Hospital Stay: Payer: Medicare HMO

## 2022-12-09 VITALS — BP 139/74 | HR 100 | Temp 97.9°F | Resp 18 | Wt 153.5 lb

## 2022-12-09 DIAGNOSIS — C9310 Chronic myelomonocytic leukemia not having achieved remission: Secondary | ICD-10-CM

## 2022-12-09 DIAGNOSIS — D693 Immune thrombocytopenic purpura: Secondary | ICD-10-CM | POA: Diagnosis not present

## 2022-12-09 DIAGNOSIS — Z5111 Encounter for antineoplastic chemotherapy: Secondary | ICD-10-CM | POA: Diagnosis not present

## 2022-12-09 MED ORDER — SODIUM CHLORIDE 0.9% FLUSH
10.0000 mL | INTRAVENOUS | Status: DC | PRN
  Administered 2022-12-09: 10 mL

## 2022-12-09 MED ORDER — SODIUM CHLORIDE 0.9 % IV SOLN
75.0000 mg/m2 | Freq: Once | INTRAVENOUS | Status: AC
  Administered 2022-12-09: 130 mg via INTRAVENOUS
  Filled 2022-12-09: qty 13

## 2022-12-09 MED ORDER — SODIUM CHLORIDE 0.9 % IV SOLN: Freq: Once | INTRAVENOUS | Status: AC

## 2022-12-09 MED ORDER — SODIUM CHLORIDE 0.9 % IV SOLN
10.0000 mg | Freq: Once | INTRAVENOUS | Status: AC
  Administered 2022-12-09: 10 mg via INTRAVENOUS
  Filled 2022-12-09: qty 10

## 2022-12-09 MED ORDER — HEPARIN SOD (PORK) LOCK FLUSH 100 UNIT/ML IV SOLN
500.0000 [IU] | Freq: Once | INTRAVENOUS | Status: AC | PRN
  Administered 2022-12-09: 500 [IU]

## 2022-12-09 MED FILL — Dexamethasone Sodium Phosphate Inj 100 MG/10ML: INTRAMUSCULAR | Qty: 1 | Status: AC

## 2022-12-09 NOTE — Patient Instructions (Signed)
[facility]  Discharge Instructions: Thank you for choosing [facility] to provide your oncology and hematology care.   If you have a lab appointment with the Cancer Center, please go directly to the Cancer Center and check in at the registration area.   Wear comfortable clothing and clothing appropriate for easy access to any Portacath or PICC line.   We strive to give you quality time with your provider. You may need to reschedule your appointment if you arrive late (15 or more minutes).  Arriving late affects you and other patients whose appointments are after yours.  Also, if you miss three or more appointments without notifying the office, you may be dismissed from the clinic at the provider's discretion.      For prescription refill requests, have your pharmacy contact our office and allow 72 hours for refills to be completed.    Today you received the following chemotherapy and/or immunotherapy agents: Vidaza     To help prevent nausea and vomiting after your treatment, we encourage you to take your nausea medication as directed.  BELOW ARE SYMPTOMS THAT SHOULD BE REPORTED IMMEDIATELY: *FEVER GREATER THAN 100.4 F (38 C) OR HIGHER *CHILLS OR SWEATING *NAUSEA AND VOMITING THAT IS NOT CONTROLLED WITH YOUR NAUSEA MEDICATION *UNUSUAL SHORTNESS OF BREATH *UNUSUAL BRUISING OR BLEEDING *URINARY PROBLEMS (pain or burning when urinating, or frequent urination) *BOWEL PROBLEMS (unusual diarrhea, constipation, pain near the anus) TENDERNESS IN MOUTH AND THROAT WITH OR WITHOUT PRESENCE OF ULCERS (sore throat, sores in mouth, or a toothache) UNUSUAL RASH, SWELLING OR PAIN  UNUSUAL VAGINAL DISCHARGE OR ITCHING   Items with * indicate a potential emergency and should be followed up as soon as possible or go to the Emergency Department if any problems should occur.  Please show the CHEMOTHERAPY ALERT CARD or IMMUNOTHERAPY ALERT CARD at check-in  to the Emergency Department and triage nurse.  Should you have questions after your visit or need to cancel or reschedule your appointment, please contact [facility]  Dept: [phone]  and follow the prompts.  Office hours are 8:00 a.m. to 4:30 p.m. Monday - Friday. Please note that voicemails left after 4:00 p.m. may not be returned until the following business day.  We are closed weekends and major holidays. You have access to a nurse at all times for urgent questions. Please call the main number to the clinic Dept: [phone] and follow the prompts.   For any non-urgent questions, you may also contact your provider using MyChart. We now offer e-Visits for anyone 18 and older to request care online for non-urgent symptoms. For details visit [email or web address].   Also download the MyChart app! Go to the app store, search "MyChart", open the app, select [facility], and log in with your MyChart username and password.  

## 2022-12-11 IMAGING — XA IR IMAGING GUIDED PORT INSERTION
1 series · 2 of 2 positions shown · non-contrast
Comparison: None.

INDICATION: 70-year-old female with history of myelodysplasia requiring frequent
transfusions presenting for Port-A-Cath placement.

EXAM:
IMPLANTED PORT A CATH PLACEMENT WITH ULTRASOUND AND FLUOROSCOPIC
GUIDANCE

[Series 1: ir fluoro/shunt/fist · 2 of 2 slices shown]
[im 1/2]
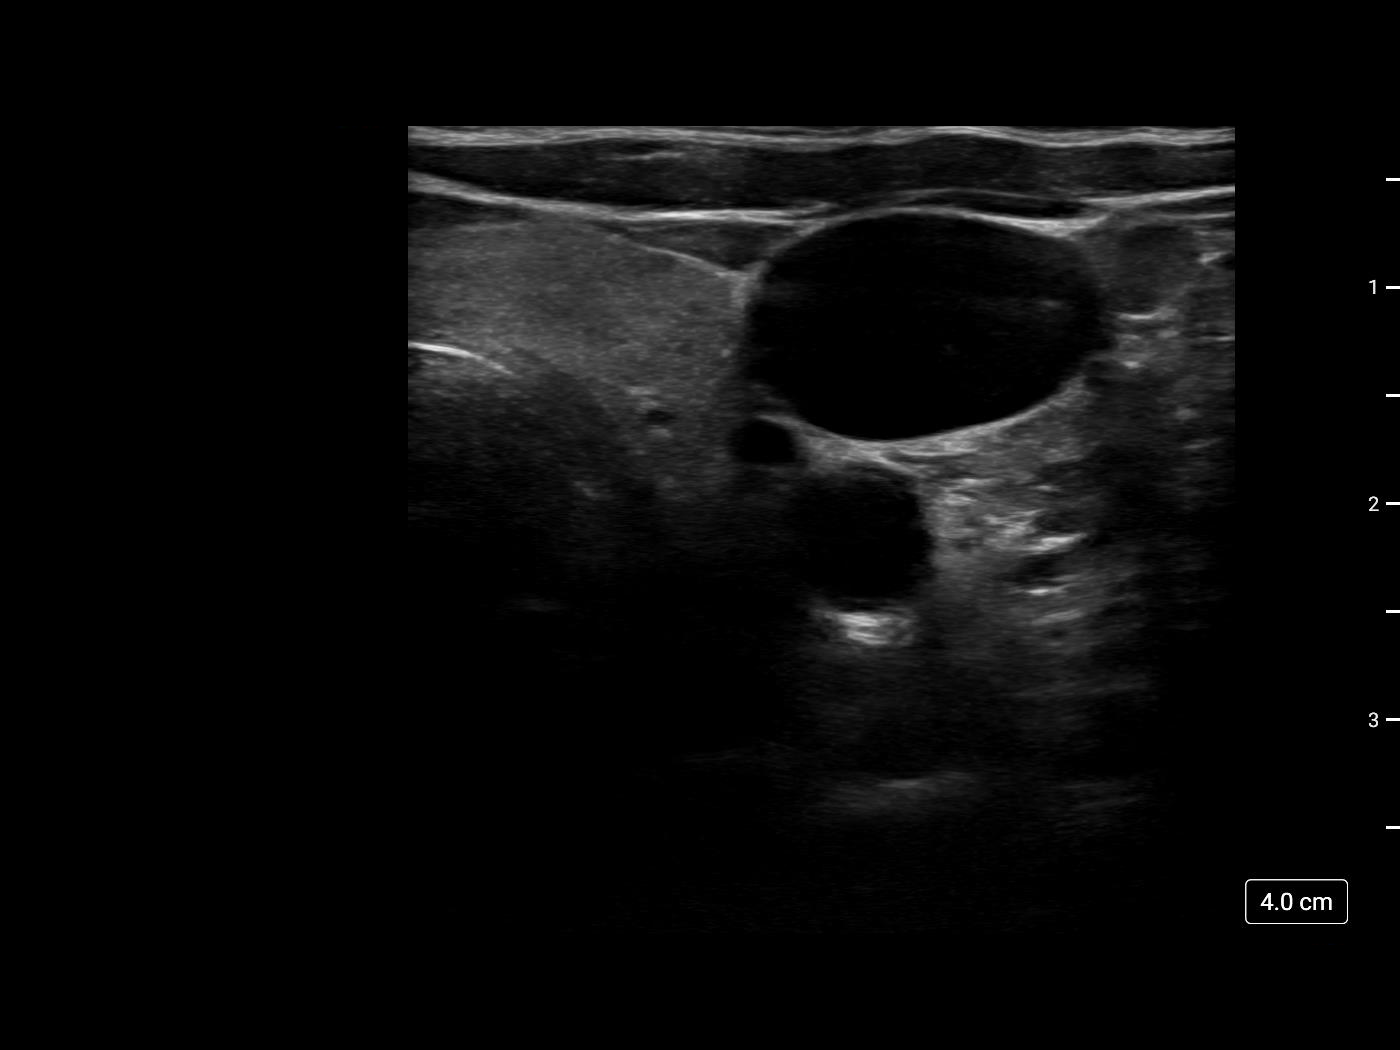
[im 2/2]
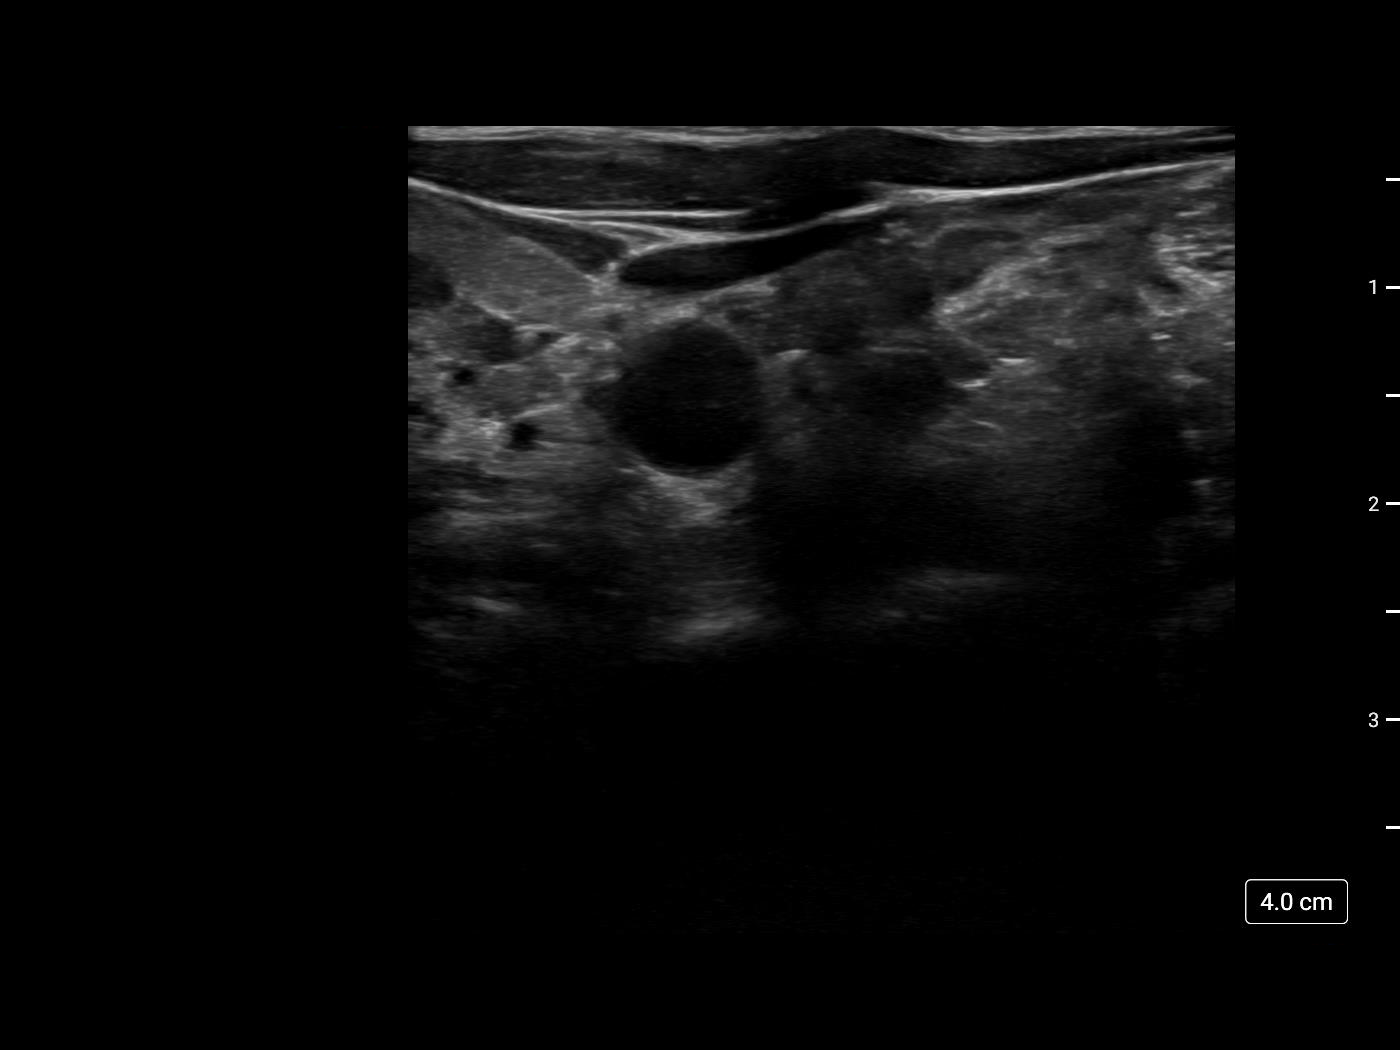

[2 of 2 positions shown; findings below may reference images not displayed]

MEDICATIONS:
None.

ANESTHESIA/SEDATION:
Moderate (conscious) sedation was employed during this procedure. A
total of Versed 2 mg and Fentanyl 100 mcg was administered
intravenously.

Moderate Sedation Time: 15 minutes. The patient's level of
consciousness and vital signs were monitored continuously by
radiology nursing throughout the procedure under my direct
supervision.

CONTRAST:  None

FLUOROSCOPY TIME:  0 minutes, 18 seconds (1 mGy)

COMPLICATIONS:
None immediate.

PROCEDURE:
The procedure, risks, benefits, and alternatives were explained to
the patient. Questions regarding the procedure were encouraged and
answered. The patient understands and consents to the procedure.

The right neck and chest were prepped with chlorhexidine in a
sterile fashion, and a sterile drape was applied covering the
operative field. Maximum barrier sterile technique with sterile
gowns and gloves were used for the procedure. A timeout was
performed prior to the initiation of the procedure.

Ultrasound was used to examine the jugular vein which was
compressible and free of internal echoes. A skin marker was used to
demarcate the planned venotomy and port pocket incision sites. Local
anesthesia was provided to these sites and the subcutaneous tunnel
track with 1% lidocaine with [DATE] epinephrine.

A small incision was created at the jugular access site and blunt
dissection was performed of the subcutaneous tissues. Under
ultrasound guidance, the jugular vein was accessed with a 21 ga
micropuncture needle and an 0.018" wire was inserted to the superior
vena cava. Real-time ultrasound guidance was utilized for vascular
access including the acquisition of a permanent ultrasound image
documenting patency of the accessed vessel. A 5 Fr micopuncture set
was then used, through which a 0.035" Rosen wire was passed under
fluoroscopic guidance into the inferior vena cava. An 8 Fr dilator
was then placed over the wire.

A subcutaneous port pocket was then created along the upper chest
wall utilizing a combination of sharp and blunt dissection. The
pocket was irrigated with sterile saline, packed with gauze, and
observed for hemorrhage. A single lumen "ISP" sized power injectable
port was chosen for placement. The 8 Fr catheter was tunneled from
the port pocket site to the venotomy incision. The port was placed
in the pocket. The external catheter was trimmed to appropriate
length. The dilator was exchanged for an 8 Fr peel-away sheath under
fluoroscopic guidance. The catheter was then placed through the
sheath and the sheath was removed. Final catheter positioning was
confirmed and documented with a fluoroscopic spot radiograph. The
port was accessed with Vimul Fulena needle, aspirated, and flushed with
heparinized saline.

The deep dermal layer of the port pocket incision was closed with
interrupted 3-0 Vicryl suture. Dermabond was then placed over the
port pocket and neck incisions. The right upper extremity PICC line
was removed upon completion of the procedure. The patient tolerated
the procedure well without immediate post procedural complication.
FINDINGS: After catheter placement, the tip lies within the superior
cavoatrial junction. The catheter aspirates and flushes normally and
is ready for immediate use.
IMPRESSION: Successful placement of a power injectable Port-A-Cath via the right
internal jugular vein. The catheter is ready for immediate use.

## 2022-12-12 ENCOUNTER — Inpatient Hospital Stay: Payer: Medicare HMO | Attending: Hematology

## 2022-12-12 ENCOUNTER — Other Ambulatory Visit: Payer: Self-pay

## 2022-12-12 VITALS — BP 117/57 | HR 87 | Temp 98.1°F | Resp 17

## 2022-12-12 DIAGNOSIS — C9310 Chronic myelomonocytic leukemia not having achieved remission: Secondary | ICD-10-CM | POA: Insufficient documentation

## 2022-12-12 DIAGNOSIS — R21 Rash and other nonspecific skin eruption: Secondary | ICD-10-CM | POA: Insufficient documentation

## 2022-12-12 DIAGNOSIS — Z5111 Encounter for antineoplastic chemotherapy: Secondary | ICD-10-CM | POA: Diagnosis not present

## 2022-12-12 DIAGNOSIS — Z7984 Long term (current) use of oral hypoglycemic drugs: Secondary | ICD-10-CM | POA: Diagnosis not present

## 2022-12-12 DIAGNOSIS — Z9884 Bariatric surgery status: Secondary | ICD-10-CM | POA: Diagnosis not present

## 2022-12-12 DIAGNOSIS — D693 Immune thrombocytopenic purpura: Secondary | ICD-10-CM | POA: Insufficient documentation

## 2022-12-12 DIAGNOSIS — Z79899 Other long term (current) drug therapy: Secondary | ICD-10-CM | POA: Diagnosis not present

## 2022-12-12 DIAGNOSIS — Z7952 Long term (current) use of systemic steroids: Secondary | ICD-10-CM | POA: Diagnosis not present

## 2022-12-12 MED ORDER — HEPARIN SOD (PORK) LOCK FLUSH 100 UNIT/ML IV SOLN
500.0000 [IU] | Freq: Once | INTRAVENOUS | Status: AC | PRN
  Administered 2022-12-12: 500 [IU]

## 2022-12-12 MED ORDER — SODIUM CHLORIDE 0.9 % IV SOLN
75.0000 mg/m2 | Freq: Once | INTRAVENOUS | Status: AC
  Administered 2022-12-12: 130 mg via INTRAVENOUS
  Filled 2022-12-12: qty 13

## 2022-12-12 MED ORDER — SODIUM CHLORIDE 0.9 % IV SOLN
10.0000 mg | Freq: Once | INTRAVENOUS | Status: AC
  Administered 2022-12-12: 10 mg via INTRAVENOUS
  Filled 2022-12-12: qty 10

## 2022-12-12 MED ORDER — SODIUM CHLORIDE 0.9 % IV SOLN: Freq: Once | INTRAVENOUS | Status: AC

## 2022-12-12 MED ORDER — SODIUM CHLORIDE 0.9% FLUSH
10.0000 mL | INTRAVENOUS | Status: DC | PRN
  Administered 2022-12-12: 10 mL

## 2022-12-12 MED FILL — Dexamethasone Sodium Phosphate Inj 100 MG/10ML: INTRAMUSCULAR | Qty: 1 | Status: AC

## 2022-12-13 ENCOUNTER — Inpatient Hospital Stay: Payer: Medicare HMO

## 2022-12-13 ENCOUNTER — Other Ambulatory Visit: Payer: Self-pay

## 2022-12-13 VITALS — BP 143/79 | HR 100 | Temp 97.1°F | Resp 18

## 2022-12-13 DIAGNOSIS — C9310 Chronic myelomonocytic leukemia not having achieved remission: Secondary | ICD-10-CM

## 2022-12-13 DIAGNOSIS — Z95828 Presence of other vascular implants and grafts: Secondary | ICD-10-CM

## 2022-12-13 DIAGNOSIS — Z7952 Long term (current) use of systemic steroids: Secondary | ICD-10-CM | POA: Diagnosis not present

## 2022-12-13 DIAGNOSIS — Z7984 Long term (current) use of oral hypoglycemic drugs: Secondary | ICD-10-CM | POA: Diagnosis not present

## 2022-12-13 DIAGNOSIS — R21 Rash and other nonspecific skin eruption: Secondary | ICD-10-CM | POA: Diagnosis not present

## 2022-12-13 DIAGNOSIS — Z79899 Other long term (current) drug therapy: Secondary | ICD-10-CM | POA: Diagnosis not present

## 2022-12-13 DIAGNOSIS — D693 Immune thrombocytopenic purpura: Secondary | ICD-10-CM | POA: Diagnosis not present

## 2022-12-13 DIAGNOSIS — Z5111 Encounter for antineoplastic chemotherapy: Secondary | ICD-10-CM | POA: Diagnosis not present

## 2022-12-13 DIAGNOSIS — Z9884 Bariatric surgery status: Secondary | ICD-10-CM | POA: Diagnosis not present

## 2022-12-13 DIAGNOSIS — D696 Thrombocytopenia, unspecified: Secondary | ICD-10-CM

## 2022-12-13 LAB — CBC WITH DIFFERENTIAL (CANCER CENTER ONLY)
Abs Immature Granulocytes: 0.3 10*3/uL — ABNORMAL HIGH (ref 0.00–0.07)
Basophils Absolute: 0 10*3/uL (ref 0.0–0.1)
Basophils Relative: 1 %
Eosinophils Absolute: 0.1 10*3/uL (ref 0.0–0.5)
Eosinophils Relative: 1 %
HCT: 29.5 % — ABNORMAL LOW (ref 36.0–46.0)
Hemoglobin: 9.8 g/dL — ABNORMAL LOW (ref 12.0–15.0)
Immature Granulocytes: 5 %
Lymphocytes Relative: 8 %
Lymphs Abs: 0.5 10*3/uL — ABNORMAL LOW (ref 0.7–4.0)
MCH: 30.3 pg (ref 26.0–34.0)
MCHC: 33.2 g/dL (ref 30.0–36.0)
MCV: 91.3 fL (ref 80.0–100.0)
Monocytes Absolute: 1.3 10*3/uL — ABNORMAL HIGH (ref 0.1–1.0)
Monocytes Relative: 20 %
Neutro Abs: 4.1 10*3/uL (ref 1.7–7.7)
Neutrophils Relative %: 65 %
Platelet Count: 86 10*3/uL — ABNORMAL LOW (ref 150–400)
RBC: 3.23 MIL/uL — ABNORMAL LOW (ref 3.87–5.11)
RDW: 20 % — ABNORMAL HIGH (ref 11.5–15.5)
WBC Count: 6.3 10*3/uL (ref 4.0–10.5)
nRBC: 0 % (ref 0.0–0.2)

## 2022-12-13 MED ORDER — SODIUM CHLORIDE 0.9% FLUSH
10.0000 mL | INTRAVENOUS | Status: DC | PRN
  Administered 2022-12-13: 10 mL

## 2022-12-13 MED ORDER — SODIUM CHLORIDE 0.9 % IV SOLN
75.0000 mg/m2 | Freq: Once | INTRAVENOUS | Status: AC
  Administered 2022-12-13: 130 mg via INTRAVENOUS
  Filled 2022-12-13: qty 13

## 2022-12-13 MED ORDER — ROMIPLOSTIM INJECTION 500 MCG
700.0000 ug | Freq: Once | SUBCUTANEOUS | Status: AC
  Administered 2022-12-13: 700 ug via SUBCUTANEOUS
  Filled 2022-12-13: qty 1

## 2022-12-13 MED ORDER — HEPARIN SOD (PORK) LOCK FLUSH 100 UNIT/ML IV SOLN
500.0000 [IU] | Freq: Once | INTRAVENOUS | Status: AC | PRN
  Administered 2022-12-13: 500 [IU]

## 2022-12-13 MED ORDER — SODIUM CHLORIDE 0.9 % IV SOLN
10.0000 mg | Freq: Once | INTRAVENOUS | Status: AC
  Administered 2022-12-13: 10 mg via INTRAVENOUS
  Filled 2022-12-13: qty 10

## 2022-12-13 MED ORDER — SODIUM CHLORIDE 0.9 % IV SOLN: Freq: Once | INTRAVENOUS | Status: AC

## 2022-12-13 NOTE — Progress Notes (Signed)
Pt arrived to infusion on [id number] of vidaza. C/O diffuse itchy rash, worse on lower extremities. Pt reports taking benedryl on night of 7/1 and morning of 7/2 before treatment. MD on call gave orders for pt to take additional benedryl and pepcid (along with decadron today for treatment). Pt requests to take meds at home. Pt reports understanding.

## 2022-12-13 NOTE — Patient Instructions (Signed)
[facility]  Discharge Instructions: Thank you for choosing [facility] to provide your oncology and hematology care.   If you have a lab appointment with the Cancer Center, please go directly to the Cancer Center and check in at the registration area.   Wear comfortable clothing and clothing appropriate for easy access to any Portacath or PICC line.   We strive to give you quality time with your provider. You may need to reschedule your appointment if you arrive late (15 or more minutes).  Arriving late affects you and other patients whose appointments are after yours.  Also, if you miss three or more appointments without notifying the office, you may be dismissed from the clinic at the provider's discretion.      For prescription refill requests, have your pharmacy contact our office and allow 72 hours for refills to be completed.    Today you received the following chemotherapy and/or immunotherapy agents vidaza       To help prevent nausea and vomiting after your treatment, we encourage you to take your nausea medication as directed.  BELOW ARE SYMPTOMS THAT SHOULD BE REPORTED IMMEDIATELY: *FEVER GREATER THAN 100.4 F (38 C) OR HIGHER *CHILLS OR SWEATING *NAUSEA AND VOMITING THAT IS NOT CONTROLLED WITH YOUR NAUSEA MEDICATION *UNUSUAL SHORTNESS OF BREATH *UNUSUAL BRUISING OR BLEEDING *URINARY PROBLEMS (pain or burning when urinating, or frequent urination) *BOWEL PROBLEMS (unusual diarrhea, constipation, pain near the anus) TENDERNESS IN MOUTH AND THROAT WITH OR WITHOUT PRESENCE OF ULCERS (sore throat, sores in mouth, or a toothache) UNUSUAL RASH, SWELLING OR PAIN  UNUSUAL VAGINAL DISCHARGE OR ITCHING   Items with * indicate a potential emergency and should be followed up as soon as possible or go to the Emergency Department if any problems should occur.  Please show the CHEMOTHERAPY ALERT CARD or IMMUNOTHERAPY ALERT CARD at  check-in to the Emergency Department and triage nurse.  Should you have questions after your visit or need to cancel or reschedule your appointment, please contact [facility]  Dept: [phone]  and follow the prompts.  Office hours are 8:00 a.m. to 4:30 p.m. Monday - Friday. Please note that voicemails left after 4:00 p.m. may not be returned until the following business day.  We are closed weekends and major holidays. You have access to a nurse at all times for urgent questions. Please call the main number to the clinic Dept: [phone] and follow the prompts.   For any non-urgent questions, you may also contact your provider using MyChart. We now offer e-Visits for anyone 18 and older to request care online for non-urgent symptoms. For details visit [email or web address].   Also download the MyChart app! Go to the app store, search "MyChart", open the app, select [facility], and log in with your MyChart username and password.   

## 2022-12-14 ENCOUNTER — Inpatient Hospital Stay: Payer: Medicare HMO

## 2022-12-15 ENCOUNTER — Other Ambulatory Visit: Payer: Self-pay | Admitting: Nurse Practitioner

## 2022-12-21 ENCOUNTER — Inpatient Hospital Stay: Payer: Medicare HMO

## 2022-12-21 ENCOUNTER — Inpatient Hospital Stay (HOSPITAL_BASED_OUTPATIENT_CLINIC_OR_DEPARTMENT_OTHER): Payer: Medicare HMO | Admitting: Hematology

## 2022-12-21 ENCOUNTER — Encounter: Payer: Self-pay | Admitting: Hematology

## 2022-12-21 VITALS — BP 107/61 | HR 80 | Temp 98.4°F | Resp 16

## 2022-12-21 VITALS — BP 117/64 | HR 82 | Temp 98.7°F | Resp 18 | Ht 66.0 in | Wt 161.2 lb

## 2022-12-21 DIAGNOSIS — Z7984 Long term (current) use of oral hypoglycemic drugs: Secondary | ICD-10-CM | POA: Diagnosis not present

## 2022-12-21 DIAGNOSIS — D696 Thrombocytopenia, unspecified: Secondary | ICD-10-CM

## 2022-12-21 DIAGNOSIS — R21 Rash and other nonspecific skin eruption: Secondary | ICD-10-CM

## 2022-12-21 DIAGNOSIS — Z5111 Encounter for antineoplastic chemotherapy: Secondary | ICD-10-CM | POA: Diagnosis not present

## 2022-12-21 DIAGNOSIS — Z95828 Presence of other vascular implants and grafts: Secondary | ICD-10-CM

## 2022-12-21 DIAGNOSIS — D693 Immune thrombocytopenic purpura: Secondary | ICD-10-CM | POA: Diagnosis not present

## 2022-12-21 DIAGNOSIS — C9310 Chronic myelomonocytic leukemia not having achieved remission: Secondary | ICD-10-CM

## 2022-12-21 DIAGNOSIS — Z79899 Other long term (current) drug therapy: Secondary | ICD-10-CM | POA: Diagnosis not present

## 2022-12-21 DIAGNOSIS — Z7952 Long term (current) use of systemic steroids: Secondary | ICD-10-CM | POA: Diagnosis not present

## 2022-12-21 DIAGNOSIS — Z9884 Bariatric surgery status: Secondary | ICD-10-CM | POA: Diagnosis not present

## 2022-12-21 DIAGNOSIS — E538 Deficiency of other specified B group vitamins: Secondary | ICD-10-CM

## 2022-12-21 HISTORY — DX: Rash and other nonspecific skin eruption: R21

## 2022-12-21 LAB — CBC WITH DIFFERENTIAL (CANCER CENTER ONLY)
Abs Immature Granulocytes: 0.46 10*3/uL — ABNORMAL HIGH (ref 0.00–0.07)
Basophils Absolute: 0.1 10*3/uL (ref 0.0–0.1)
Basophils Relative: 1 %
Eosinophils Absolute: 0.2 10*3/uL (ref 0.0–0.5)
Eosinophils Relative: 2 %
HCT: 29 % — ABNORMAL LOW (ref 36.0–46.0)
Hemoglobin: 9.5 g/dL — ABNORMAL LOW (ref 12.0–15.0)
Immature Granulocytes: 5 %
Lymphocytes Relative: 7 %
Lymphs Abs: 0.6 10*3/uL — ABNORMAL LOW (ref 0.7–4.0)
MCH: 29.7 pg (ref 26.0–34.0)
MCHC: 32.8 g/dL (ref 30.0–36.0)
MCV: 90.6 fL (ref 80.0–100.0)
Monocytes Absolute: 2 10*3/uL — ABNORMAL HIGH (ref 0.1–1.0)
Monocytes Relative: 23 %
Neutro Abs: 5.3 10*3/uL (ref 1.7–7.7)
Neutrophils Relative %: 62 %
Platelet Count: 68 10*3/uL — ABNORMAL LOW (ref 150–400)
RBC: 3.2 MIL/uL — ABNORMAL LOW (ref 3.87–5.11)
RDW: 19.9 % — ABNORMAL HIGH (ref 11.5–15.5)
WBC Count: 8.6 10*3/uL (ref 4.0–10.5)
nRBC: 0 % (ref 0.0–0.2)

## 2022-12-21 LAB — VITAMIN B12: Vitamin B-12: 2175 pg/mL — ABNORMAL HIGH (ref 180–914)

## 2022-12-21 LAB — SAMPLE TO BLOOD BANK

## 2022-12-21 MED ORDER — FUROSEMIDE 20 MG PO TABS: 20.0000 mg | ORAL_TABLET | Freq: Every day | ORAL | 0 refills | Status: DC | PRN

## 2022-12-21 MED ORDER — METHYLPREDNISOLONE SODIUM SUCC 125 MG IJ SOLR: 125.0000 mg | Freq: Once | INTRAMUSCULAR | Status: DC | PRN

## 2022-12-21 MED ORDER — ALBUTEROL SULFATE HFA 108 (90 BASE) MCG/ACT IN AERS: 2.0000 | INHALATION_SPRAY | Freq: Once | RESPIRATORY_TRACT | Status: DC | PRN

## 2022-12-21 MED ORDER — ALTEPLASE 2 MG IJ SOLR: 2.0000 mg | Freq: Once | INTRAMUSCULAR | Status: DC | PRN

## 2022-12-21 MED ORDER — HEPARIN SOD (PORK) LOCK FLUSH 100 UNIT/ML IV SOLN
500.0000 [IU] | Freq: Once | INTRAVENOUS | Status: AC
  Administered 2022-12-21: 500 [IU]

## 2022-12-21 MED ORDER — METHYLPREDNISOLONE 4 MG PO TBPK: ORAL_TABLET | ORAL | 0 refills | Status: DC

## 2022-12-21 MED ORDER — ROMIPLOSTIM INJECTION 500 MCG
700.0000 ug | Freq: Once | SUBCUTANEOUS | Status: AC
  Administered 2022-12-21: 700 ug via SUBCUTANEOUS
  Filled 2022-12-21: qty 1

## 2022-12-21 MED ORDER — FAMOTIDINE IN NACL 20-0.9 MG/50ML-% IV SOLN: 20.0000 mg | Freq: Once | INTRAVENOUS | Status: DC | PRN

## 2022-12-21 MED ORDER — SODIUM CHLORIDE 0.9% FLUSH
10.0000 mL | Freq: Once | INTRAVENOUS | Status: AC
  Administered 2022-12-21: 10 mL

## 2022-12-21 MED ORDER — DIPHENHYDRAMINE HCL 50 MG/ML IJ SOLN: 50.0000 mg | Freq: Once | INTRAMUSCULAR | Status: DC | PRN

## 2022-12-21 MED ORDER — SODIUM CHLORIDE 0.9 % IV SOLN: Freq: Once | INTRAVENOUS | Status: DC | PRN

## 2022-12-21 MED ORDER — EPINEPHRINE 0.3 MG/0.3ML IJ SOAJ: 0.3000 mg | Freq: Once | INTRAMUSCULAR | Status: DC | PRN

## 2022-12-21 NOTE — Progress Notes (Signed)
[facility]   [phone] Fax:([phone]) [phone]   Clinic Follow up Note   Patient Care Team: Natalia Leatherwood, DO as PCP - General (Family Medicine) Myrtie Neither, Andreas Blower, MD as Consulting Physician (Gastroenterology) Malachy Mood, MD as Consulting Physician (Hematology) Glendale Chard, DO as Consulting Physician (Neurology)  Date of Service:  12/21/2022  CHIEF COMPLAINT:  skin rash    CURRENT THERAPY:  Azacitidine days 1-5 q28 days, started 12/28/20 -Nplate 44mcg/kg weekly -Platelet transfusion as needed (plt<15 -20K), blood transfusion if Hg<8.0   ASSESSMENT:  Kathleen Gibson is a 72 y.o. female with    1. Skin rash in legs -started 2-3 weeks ago, she has diffuse small macular rash in her legs, very itchy -Not sure about the cause, drug rash from deciding is possible, but is unusual since she has been on it for so long -she has tried topical hydrocortisone and allergy medicine, which does not adequately control her symptoms -I will call in Medrol dose pack -Her rash could also be partially related to her leg edema, she has gained significant weight in the past 2 to 3 weeks, I will refill her furosemide, and I encouraged her to use compression stocks   2. [name] with severe thrombocytopenia  -Initially diagnosed with ITP in 2014. She lost follow up after 11/2017 and did not proceed with recommended bone marrow biopsy. -She was referred to ED 10/28/20 by her PCP for low plt count of 4k. She was hospitalized and treated with platelet transfusions, IVIG, oral prednisone 60mg  and Nplate injections. She tapered off prednisone given little response. -Bone marrow biopsy 10/30/20 felt to likely represent MDS/MPN, particularly [name].  Confirmed on slide review at [facility] by Dr. Sharyne Richters -She began treatment for severe refractory thrombocytopenia with weekly Rituxan x4 on 11/19/20, she did not respond  -She began azacitidine daily days 1-5 q. 28 days on 12/28/2020 -She  had worsening thrombocytopenia after stopping Nplate, restarted weekly Nplate 10 mcg/kg on 01/04/2021.  Dose adjustment per platelet level, currently 10 mcg/kg -Bone marrow biopsy 03/18/2021 showed stable disease, no increase in blasts. -She continues azacitidine daily days 1-5 q. 28 days; and weekly Nplate; s/p C12 which was increased to full dose 75 mg/m2.    PLAN: -I prescribe Lasix for fluid retention in lower extremities - I prescribe methlprednisone dosepack  - I recommend compression socks -lab/flush and injection 7/17  SUMMARY OF ONCOLOGIC HISTORY: Oncology History  [name] (chronic myelomonocytic leukemia) (HCC)  10/29/2020 Imaging   CT CAP  IMPRESSION: 1. Splenomegaly with pathologically enlarged lymph nodes above and below the diaphragm, with overall stable to minimally increased abdominal adenopathy and interval progression of the pelvic adenopathy. 2. Small volume abdominopelvic ascites with diffuse mesenteric stranding. 3. Scattered bilateral pulmonary micro nodules measuring 1-2 mm. 4. Distended gallbladder with some layering hyperdense material representing layering sludge and tiny stones seen on prior ultrasound. 5. Aortic atherosclerosis.   10/30/2020 Pathology Results   DIAGNOSIS:   BONE MARROW, ASPIRATE, CLOT, CORE:  -  Hypercellular bone marrow with panhyperplasia, atypia, and no  increase in blasts  -  See comment   PERIPHERAL BLOOD:  -  Marked thrombocytopenia  -  Absolute monocytosis  -  Normocytic anemia  -  See CBC data and comment   COMMENT:  The bone marrow is hypercellular for age (approximately 80%) with myeloid hyperplasia with maturational left shift, erythroid hyperplasia, and increased megakaryocytes.  Mild multilineage atypia is present. Blasts are not increased on aspirate  smears (1% by manual differential count) or by CD34 immunohistochemistry on the core biopsy.  Concurrent flow cytometric analysis of the bone marrow aspirate demonstrates  increased monocytes, and no increase in blasts or abnormal lymphoid population (see [id number]).  Monocytes are also increased in peripheral  blood, persistent per electronic medical record.  In aggregate, the  findings raise the possibility of a myeloid neoplasm with the  differential diagnosis including a low-grade myelodysplastic syndrome and chronic myelomonocytic leukemia (dysplastic type).    ADDENDUM:  A reticulin special stain performed on the bone marrow core biopsy reveals no significant increase in reticulin fibrosis.  ADDENDUM:  CYTOGENETIC RESULTS:  Karyotype: 46,XX[20]  Interpretation: NORMAL FEMALE KARYOTYPE   FISH RESULTS:  Results: NORMAL   ADDENDUM:  CD123 immunohistochemistry performed on the core biopsy highlights scattered aggregates of positively staining cells consistent with plasmacytoid dendritic cells.    10/30/2020 Pathology Results   DIAGNOSIS:   BONE MARROW; FLOW CYTOMETRIC ANALYSIS:  -  Increased monocytes  -  Scant B-cells present  -  No immunophenotypically aberrant T-cell population identified  -  No increase in blasts  -  See comment   COMMENT:  Monocytes are relatively increased (12% of all cells), without aberrant expression of CD56.  B-cells comprise <1% of total lymphocytes. CD34-positive blasts are not increased (<1% of all cells).  Correlation with concurrent morphology is recommended for complete diagnostic interpretation and overall blast enumeration (see [id number]).    10/30/2020 Pathology Results   FINAL MICROSCOPIC DIAGNOSIS:   A. LYMPH NODE, RIGHT AXILLARY, NEEDLE CORE BIOPSY:  -Lymphoid tissue present  -See comment   COMMENT:  The sections show several small needle core biopsy fragments of lymphoid tissue displaying degenerative cellular changes/necrosis and hence cannot be accurately evaluated.  Sample for flow cytometric analysis not available.  Immunohistochemical stain for CD3 and CD20 were performed with appropriate controls.   There is a mixture of T and B cells in their apparently respective compartments.  There is no definite metastatic malignancy.    11/11/2020 Pathology Results   DIAGNOSIS:   LEFT AXILLARY LYMPH NODE EXCISIONAL BIOPSY; FLOW CYTOMETRIC ANALYSIS:  -  No monotypic B-cell or immunophenotypically aberrant T-cell  population identified  -  See comment   COMMENT:  Flow cytometric analysis identifies B-cells with a normal kappa:lambda ratio of 1.7:1.  A subset of the polytypic B-cells expresses CD10. T-cells show a CD4:CD8 ratio of 3.3:1 without immunophenotypic aberrancy with the markers evaluated.  Although these results do not support the diagnosis of a clonal lymphoid population, sampling issues must always be considered when negative results are obtained, as focal lesions may not be represented in the specimen submitted.  In addition, flow cytometric immunophenotyping will not exclude other pathology if present (e.g. Hodgkin lymphoma, some T-cell lymphomas, metastatic and infectious diseases).   11/11/2020 Pathology Results   FINAL MICROSCOPIC DIAGNOSIS:   A. LYMPH NODE, LEFT AXILLARY, DISSECTION:  -  Follicular hyperplasia with interfollicular expansion  -  See comment   COMMENT:  Sections of the lymph nodes reveal generally preserved lymph node architecture.  There is follicular hyperplasia with some follicles showing increased hyalinization of germinal centers and mild concentric encircling of germinal centers with small lymphocytes (onion skinning). Interfollicular areas are expanded with increased vascular proliferation, small lymphocytes, plasma cells, histiocytic cells, and patchy neutrophils.  The lymph node capsule is variably thickened by fibrosis with few plasma cells.   A panel of immunohistochemical stains is performed for further  characterization.  CD3 and CD20/PAX5 highlight  T-cell and B-cell  compartments, respectively.  Germinal centers are highlighted by CD10 and BCL6 with  appropriate absent expression of BCL2.  CD5 is similar to CD3.  CD43 also stains the T-cells.  CD4-positive T-cells exceed CD8-positive T-cells.  CD30 highlights scattered immunoblasts.  CD21 reveals intact follicular dendritic cell meshworks.  CD68 highlights increased histiocytic cells.  HHV 8 is negative.  T. pallidum reveals no definitive organisms.  Pancytokeratin is negative.  TdT stains rare cells.  CD138 highlights plasma cells which are not increased in number and show polytypic light chain expression by kappa/lambda in situ  hybridization.  EBV is negative by in situ hybridization.   Concurrent flow cytometric analysis is negative for a monoclonal B-cell or immunophenotypically aberrant T-cell population (see [phone]).   Together, the findings above are non-specific and can be seen in  reactive and infectious processes as well as Castleman disease.  There is no definitive morphologic or flow cytometric evidence of involvement by a lymphoproliferative disorder from the current workup.   12/17/2020 Initial Diagnosis   [name] (chronic myelomonocytic leukemia) (HCC)   12/28/2020 - 02/03/2022 Chemotherapy   Patient is on Treatment Plan : MYELODYSPLASIA  Azacitidine IV D1-5 q28d     03/18/2021 Pathology Results   Final Diagnosis    BONE MARROW:             Persistent chronic myelomonocytic leukemia in a hypercellular bone marrow with increased monocytes and no increase in blasts.   Comment    Flow cytometric analysis showed no increase in blasts and 17% monocytes.A next generation myeloid panel showed the presence of the following mutations: NRAS, SH2B3, PHF6 and TET2. CD34 and CD117 immunstains do not show an increase in blasts. The findings are consistent with persistent [name] with a decrease in the number of blasts compared to that seen on the previous bone marrow.    Final Interpretation      BONE MARROW; FLOW CYTOMETRIC ANALYSIS:   No increased blasts identified. (see comment)   Monocytosis (17% of total events).    COMMENT: Flow cytometry identified about 0.6% of total events as immature cells. These cells express CD45 (dim), CD34, CD117, HLA-DR, CD38, CD33 (variable). This immunophenotype is consistent with normal myeloblasts. In addition, monocytes are increased and comprise of approximately 17% of total events. These monocytes show normal expression of CD33/CD64/CD14/CD11b with variable expression of CD4. Correlation with concurrent morphology and clinical data is recommended (see [id number]).    FLOW CYTOMETRY ANALYSIS: CD45 versus side scatter analysis demonstrate a predominance of granulocytes (~70%), monocytes (~17%) and lymphocytes (~6%). No significant increase in blasts identified. All tested markers were used for adequate analysis of the cells and were appropriately reviewed.     12/28/2021 -  Chemotherapy   Patient is on Treatment Plan : MYELODYSPLASIA  Azacitidine IV D1-5 q28d        INTERVAL HISTORY:  Kathleen Gibson is here for a follow up of  [name] and thrombocytopenia . She was last seen by  NP Lacie on 12/07/2022. She presents to the clinic alone. Pt state that the rash started last Monday. She reports of being very itch and she has some swelling in her lower extremities.She has taken Benadryl and hydrocortisone for relief. She states that she can't remember if she has this condition before. Pt state that its not getting better.     All other systems were reviewed with the patient and are negative.  MEDICAL HISTORY:  Past Medical History:  Diagnosis Date   Acute  bronchitis due to COVID-19 virus 05/16/2022   Diabetes (HCC)    HLD (hyperlipidemia)    Hypertension    Pernicious anemia    Pneumonia 09/05/2019   Renal insufficiency 09/06/2019   Vitamin D deficiency     SURGICAL HISTORY: Past Surgical History:  Procedure Laterality Date   ABDOMINAL HERNIA REPAIR  2009   ABDOMINOPLASTY     AXILLARY LYMPH NODE BIOPSY Left 11/11/2020   Procedure:  LEFT AXILLARY EXCISIONAL LYMPH NODE BIOPSY;  Surgeon: Darnell Level, MD;  Location: [facility] OR;  Service: General;  Laterality: Left;   CESAREAN SECTION  1978   GASTRIC BYPASS  2000   hemorroid surgery  2007   IR IMAGING GUIDED PORT INSERTION  07/26/2021   IR KYPHO EA ADDL LEVEL THORACIC OR LUMBAR  05/31/2021   IR KYPHO THORACIC WITH BONE BIOPSY  05/31/2021   TONSILLECTOMY AND ADENOIDECTOMY  1957    I have reviewed the social history and family history with the patient and they are unchanged from previous note.  ALLERGIES:  is allergic to asa [aspirin], nsaids, and red blood cells.  MEDICATIONS:  Current Outpatient Medications  Medication Sig Dispense Refill   furosemide (LASIX) 20 MG tablet Take 1 tablet (20 mg total) by mouth daily as needed. 20 tablet 0   methylPREDNISolone (MEDROL DOSEPAK) 4 MG TBPK tablet Take 6 tabs for first day, the decrease by 1 tab daily until finish 21 tablet 0   cyanocobalamin (,VITAMIN B-12,) 1000 MCG/ML injection Inject 1,000 mcg into the muscle every 30 (thirty) days.     cyclobenzaprine (FLEXERIL) 5 MG tablet Take 1 tablet (5 mg total) by mouth 3 (three) times daily as needed for muscle spasms. 30 tablet 0   furosemide (LASIX) 20 MG tablet TAKE 1 TABLET BY MOUTH EVERY DAY FOR UP TO 7 DAYS THEN AS NEEDED FOR LEG EDEMA 90 tablet 1   glucose blood (ONETOUCH VERIO) test strip Monitor blood sugars twice daily 100 each 5   KLOR-CON M10 10 MEQ tablet TAKE 1 TABLET BY MOUTH TWICE A DAY TAKE ALONG WITH FUROSEMIDE ONLY 180 tablet 1   Lancets (ONETOUCH ULTRASOFT) lancets Monitor blood sugars twice daily 100 each 5   lidocaine (HM LIDOCAINE PATCH) 4 % Place 2 patches onto the skin daily. 60 patch 0   lidocaine-prilocaine (EMLA) cream APPLY 2 GRAMS TO PORT-A-CATH SITE 30-60 MINUTES PRIOR TO PORT ACCESS AS NEEDED 30 g 1   LORazepam (ATIVAN) 1 MG tablet TAKE 1 TABLET BY MOUTH EVERY 8 HOURS AS NEEDED FOR ANXIETY 30 tablet 0   metoprolol succinate (TOPROL-XL) 25 MG 24 hr tablet  Take 1 tablet (25 mg total) by mouth daily. 90 tablet 1   naloxone (NARCAN) nasal spray 4 mg/0.1 mL Place 1 spray into the nose once as needed (overdose). 2 each 0   omeprazole (PRILOSEC) 20 MG capsule Take 20 mg by mouth daily.     ondansetron (ZOFRAN-ODT) 8 MG disintegrating tablet Take 0.5 tablets (4 mg total) by mouth every 8 (eight) hours as needed for nausea or vomiting. 30 tablet 3   Oxycodone HCl 10 MG TABS Take 1 tablet (10 mg total) by mouth 2 (two) times daily as needed. 60 tablet 0   Oxycodone HCl 10 MG TABS Take 1 tablet (10 mg total) by mouth 2 (two) times daily as needed. 60 tablet 0   Oxycodone HCl 10 MG TABS Take 1 tablet (10 mg total) by mouth 2 (two) times daily as needed. 60 tablet 0   polyethylene  glycol powder (GLYCOLAX/MIRALAX) 17 GM/SCOOP powder Dissolve 1 capful (17 g) in water and drink 2 (two) times daily. (Patient taking differently: Take 17 g by mouth daily.) 510 g 2   pregabalin (LYRICA) 150 MG capsule Take 1 capsule (150 mg total) by mouth 2 (two) times daily. 180 capsule 1   prochlorperazine (COMPAZINE) 10 MG tablet Take 1 tablet (10 mg total) by mouth every 6 (six) hours as needed. 30 tablet 3   promethazine-dextromethorphan (PROMETHAZINE-DM) 6.25-15 MG/5ML syrup Take 5 mLs by mouth 4 (four) times daily as needed. 118 mL 0   SENEXON-S 8.6-50 MG tablet TAKE 2 TABLETS BY MOUTH AT BEDTIME AS NEEDED FOR MILD CONSTIPATION. 60 tablet 1   sitaGLIPtin-metformin (JANUMET) 50-1000 MG tablet Take 1 tablet by mouth at bedtime. 90 tablet 1   No current facility-administered medications for this visit.   Facility-Administered Medications Ordered in Other Visits  Medication Dose Route Frequency Provider Last Rate Last Admin   0.9 %  sodium chloride infusion (Manually program via Guardrails IV Fluids)  250 mL Intravenous Once Malachy Mood, MD       0.9 %  sodium chloride infusion   Intravenous Once PRN Malachy Mood, MD   Stopped at 12/21/22 [phone]   alteplase (CATHFLO ACTIVASE) injection  2 mg  2 mg Intracatheter Once PRN Malachy Mood, MD        PHYSICAL EXAMINATION: ECOG PERFORMANCE STATUS: 1 - Symptomatic but completely ambulatory  Vitals:   12/21/22 0913  BP: 117/64  Pulse: 82  Resp: 18  Temp: 98.7 F (37.1 C)  SpO2: 98%   Wt Readings from Last 3 Encounters:  12/21/22 161 lb 3.2 oz (73.1 kg)  12/09/22 153 lb 8 oz (69.6 kg)  12/07/22 153 lb 12.8 oz (69.8 kg)     GENERAL:alert, no distress and comfortable SKIN: skin color normal, (+)rashes or significant lesions EYES: normal, Conjunctiva are pink and non-injected, sclera clear  NEURO: alert & oriented x 3 with fluent speech   LABORATORY DATA:  I have reviewed the data as listed    Latest Ref Rng & Units 12/21/2022    8:26 AM 12/13/2022    8:16 AM 12/07/2022    8:34 AM  CBC  WBC 4.0 - 10.5 K/uL 8.6  6.3  5.4   Hemoglobin 12.0 - 15.0 g/dL 9.5  9.8  29.5   Hematocrit 36.0 - 46.0 % 29.0  29.5  29.2   Platelets 150 - 400 K/uL 68  86  73         Latest Ref Rng & Units 12/07/2022    8:34 AM 11/30/2022    7:44 AM 11/16/2022    8:57 AM  CMP  Glucose 70 - 99 mg/dL 621  93  308   BUN 8 - 23 mg/dL 13  12  12    Creatinine 0.44 - 1.00 mg/dL 6.57  8.46  9.62   Sodium 135 - 145 mmol/L 140  141  140   Potassium 3.5 - 5.1 mmol/L 3.6  4.0  4.0   Chloride 98 - 111 mmol/L 107  111  109   CO2 22 - 32 mmol/L 24  26  28    Calcium 8.9 - 10.3 mg/dL 7.9  8.0  8.1   Total Protein 6.5 - 8.1 g/dL  6.0  6.4   Total Bilirubin 0.3 - 1.2 mg/dL  1.5  1.2   Alkaline Phos 38 - 126 U/L  144  132   AST 15 - 41 U/L  26  24   ALT 0 - 44 U/L  20  24       RADIOGRAPHIC STUDIES: I have personally reviewed the radiological images as listed and agreed with the findings in the report. No results found.    No orders of the defined types were placed in this encounter.  All questions were answered. The patient knows to call the clinic with any problems, questions or concerns. No barriers to learning was detected. The total time spent in  the appointment was 15 minutes.     Malachy Mood, MD 12/21/2022   Carolin Coy, CMA, am acting as scribe for Malachy Mood, MD.   I have reviewed the above documentation for accuracy and completeness, and I agree with the above.

## 2022-12-27 ENCOUNTER — Encounter: Payer: Self-pay | Admitting: Family Medicine

## 2022-12-27 ENCOUNTER — Other Ambulatory Visit: Payer: Self-pay

## 2022-12-27 ENCOUNTER — Ambulatory Visit: Payer: Medicare HMO | Admitting: Family Medicine

## 2022-12-27 VITALS — BP 114/71 | HR 70 | Temp 97.9°F | Wt 155.6 lb

## 2022-12-27 DIAGNOSIS — G609 Hereditary and idiopathic neuropathy, unspecified: Secondary | ICD-10-CM | POA: Diagnosis not present

## 2022-12-27 DIAGNOSIS — E1142 Type 2 diabetes mellitus with diabetic polyneuropathy: Secondary | ICD-10-CM | POA: Diagnosis not present

## 2022-12-27 DIAGNOSIS — C9310 Chronic myelomonocytic leukemia not having achieved remission: Secondary | ICD-10-CM

## 2022-12-27 DIAGNOSIS — G4700 Insomnia, unspecified: Secondary | ICD-10-CM | POA: Diagnosis not present

## 2022-12-27 DIAGNOSIS — M549 Dorsalgia, unspecified: Secondary | ICD-10-CM

## 2022-12-27 DIAGNOSIS — D510 Vitamin B12 deficiency anemia due to intrinsic factor deficiency: Secondary | ICD-10-CM

## 2022-12-27 DIAGNOSIS — I10 Essential (primary) hypertension: Secondary | ICD-10-CM | POA: Diagnosis not present

## 2022-12-27 DIAGNOSIS — S22000A Wedge compression fracture of unspecified thoracic vertebra, initial encounter for closed fracture: Secondary | ICD-10-CM | POA: Diagnosis not present

## 2022-12-27 DIAGNOSIS — D696 Thrombocytopenia, unspecified: Secondary | ICD-10-CM | POA: Diagnosis not present

## 2022-12-27 DIAGNOSIS — R21 Rash and other nonspecific skin eruption: Secondary | ICD-10-CM

## 2022-12-27 DIAGNOSIS — E782 Mixed hyperlipidemia: Secondary | ICD-10-CM

## 2022-12-27 LAB — POCT GLYCOSYLATED HEMOGLOBIN (HGB A1C)
HbA1c POC (<> result, manual entry): 5.6 % (ref 4.0–5.6)
HbA1c, POC (controlled diabetic range): 5.6 % (ref 0.0–7.0)
HbA1c, POC (prediabetic range): 5.6 % — AB (ref 5.7–6.4)
Hemoglobin A1C: 5.6 % (ref 4.0–5.6)

## 2022-12-27 MED ORDER — SITAGLIPTIN PHOSPHATE 50 MG PO TABS: 50.0000 mg | ORAL_TABLET | Freq: Every day | ORAL | 1 refills | Status: DC

## 2022-12-27 MED ORDER — METOPROLOL SUCCINATE ER 25 MG PO TB24: 25.0000 mg | ORAL_TABLET | Freq: Every day | ORAL | 1 refills | Status: DC

## 2022-12-27 MED ORDER — FLUOCINONIDE 0.05 % EX CREA: 1.0000 | TOPICAL_CREAM | Freq: Two times a day (BID) | CUTANEOUS | 1 refills | Status: DC

## 2022-12-27 MED ORDER — OXYCODONE HCL 10 MG PO TABS: 10.0000 mg | ORAL_TABLET | Freq: Two times a day (BID) | ORAL | 0 refills | Status: DC | PRN

## 2022-12-27 MED ORDER — PREGABALIN 150 MG PO CAPS: 150.0000 mg | ORAL_CAPSULE | Freq: Two times a day (BID) | ORAL | 1 refills | Status: DC

## 2022-12-27 NOTE — Patient Instructions (Addendum)
Return in about 15 weeks (around 04/11/2023) for Routine chronic condition follow-up.        Great to see you today.  I have refilled the medication(s) we provide.   If labs were collected, we will inform you of lab results once received either by echart message or telephone call.   - echart message- for normal results that have been seen by the patient already.   - telephone call: abnormal results or if patient has not viewed results in their echart.

## 2022-12-27 NOTE — Progress Notes (Signed)
Kathleen Gibson , 25-Sep-1950, 72 y.o., female MRN: [id number] Patient Care Team    Relationship Specialty Notifications Start End  Natalia Leatherwood, DO PCP - General Family Medicine  04/05/22   Sherrilyn Rist, MD Consulting Physician Gastroenterology  04/06/17   Malachy Mood, MD Consulting Physician Hematology  04/06/17   Glendale Chard, DO Consulting Physician Neurology  01/16/19     Chief Complaint  Patient presents with   Diabetes     Subjective:  Kathleen Gibson  is a 72 y.o. female presents for Chronic Conditions/illness Management  INSOMNIA, CHRONIC/RLS Ambien being  placed on hold for now during use of Flexeril and chronic opioids. She reports she sleeping better  Thrombocytopenia (HCC)/Chronic myelomonocytic leukemia not having achieved remission [facility]) She has routine follow-ups with her oncology team.  Intractable back pain/Compression fracture of body of thoracic vertebra (HCC) Patient continues to require opioid for discomfort due to her compression fracture and back pain, but not as frequently. She reports she typically only takes before bed.  She is compliant with Lyrica and feels it is working well.  Type 2 diabetes mellitus with diabetic polyneuropathy, without long-term current use of insulin (HCC)/hyperlipidemia She reports compliance with Janumet 50-1000 mcg daily..  Patient denies dizziness, hyperglycemic or hypoglycemic events. Patient denies numbness, tingling in the extremities or nonhealing wounds of feet.   Hypertension: Patient reports compliance with metoprolol 25 mg daily.  Patient denies chest pain, shortness of breath, dizziness or lower extremity edema.    Recent Labs  Lab 12/21/22 0826  HGB 9.5*  HCT 29.0*  WBC 8.6  PLT 68*      Latest Ref Rng & Units 12/07/2022    8:34 AM 11/30/2022    7:44 AM 11/16/2022    8:57 AM  CMP  Glucose 70 - 99 mg/dL 332  93  951   BUN 8 - 23 mg/dL 13  12  12    Creatinine 0.44 - 1.00 mg/dL 8.84  1.66  0.63    Sodium 135 - 145 mmol/L 140  141  140   Potassium 3.5 - 5.1 mmol/L 3.6  4.0  4.0   Chloride 98 - 111 mmol/L 107  111  109   CO2 22 - 32 mmol/L 24  26  28    Calcium 8.9 - 10.3 mg/dL 7.9  8.0  8.1   Total Protein 6.5 - 8.1 g/dL  6.0  6.4   Total Bilirubin 0.3 - 1.2 mg/dL  1.5  1.2   Alkaline Phos 38 - 126 U/L  144  132   AST 15 - 41 U/L  26  24   ALT 0 - 44 U/L  20  24    No results found.     12/27/2022    8:35 AM 08/12/2022   11:15 AM 09/07/2021    9:16 AM 03/26/2021   11:29 AM 12/22/2020    3:34 PM  Depression screen PHQ 2/9  Decreased Interest 0 0 0 0 2  Down, Depressed, Hopeless 0 0 0 0 1  PHQ - 2 Score 0 0 0 0 3  Altered sleeping     3  Tired, decreased energy     1  Change in appetite     0  Feeling bad or failure about yourself      0  Trouble concentrating     1  Moving slowly or fidgety/restless     0  Suicidal thoughts  0  PHQ-9 Score     8    Allergies  Allergen Reactions   Asa [Aspirin] Other (See Comments)    Contraindicated d/t low plts   Nsaids Other (See Comments)    Contraindicated d/t low plts   Red Blood Cells Nausea Only and Other (See Comments)    Near end of transfusion pt developed nausea, facial flushing, and tachycardia. See progress note from 03/01/22   Social History   Tobacco Use   Smoking status: Never    Passive exposure: Never   Smokeless tobacco: Never  Substance Use Topics   Alcohol use: No   Past Medical History:  Diagnosis Date   Acute bronchitis due to COVID-19 virus 05/16/2022   Diabetes (HCC)    HLD (hyperlipidemia)    Hypertension    Pernicious anemia    Pneumonia 09/05/2019   Renal insufficiency 09/06/2019   Skin rash 12/21/2022   Vitamin D deficiency    Past Surgical History:  Procedure Laterality Date   ABDOMINAL HERNIA REPAIR  2009   ABDOMINOPLASTY     AXILLARY LYMPH NODE BIOPSY Left 11/11/2020   Procedure: LEFT AXILLARY EXCISIONAL LYMPH NODE BIOPSY;  Surgeon: Darnell Level, MD;  Location: [facility] OR;  Service:  General;  Laterality: Left;   CESAREAN SECTION  1978   GASTRIC BYPASS  2000   hemorroid surgery  2007   IR IMAGING GUIDED PORT INSERTION  07/26/2021   IR KYPHO EA ADDL LEVEL THORACIC OR LUMBAR  05/31/2021   IR KYPHO THORACIC WITH BONE BIOPSY  05/31/2021   TONSILLECTOMY AND ADENOIDECTOMY  1957   Family History  Problem Relation Age of Onset   Cancer Mother 24       unknown type cancer    Hypertension Mother    Hypertension Father    Heart failure Father    Pneumonia Father    Diabetes Sister    Leukemia Brother    Colon cancer Brother 56   Heart Problems Brother    Leukemia Other    Diabetes Sister    Heart Problems Sister    Cancer Brother    Heart Problems Brother    Allergies as of 12/27/2022       Reactions   Asa [aspirin] Other (See Comments)   Contraindicated d/t low plts   Nsaids Other (See Comments)   Contraindicated d/t low plts   Red Blood Cells Nausea Only, Other (See Comments)   Near end of transfusion pt developed nausea, facial flushing, and tachycardia. See progress note from 03/01/22        Medication List        Accurate as of December 27, 2022  9:16 AM. If you have any questions, ask your nurse or doctor.          STOP taking these medications    cyanocobalamin 1000 MCG/ML injection Commonly known as: VITAMIN B12 Stopped by: Felix Pacini   methylPREDNISolone 4 MG Tbpk tablet Commonly known as: MEDROL DOSEPAK Stopped by: Felix Pacini   sitaGLIPtin-metformin 50-1000 MG tablet Commonly known as: JANUMET Stopped by: Felix Pacini       TAKE these medications    cyclobenzaprine 5 MG tablet Commonly known as: FLEXERIL Take 1 tablet (5 mg total) by mouth 3 (three) times daily as needed for muscle spasms.   fluocinonide cream 0.05 % Commonly known as: LIDEX Apply 1 Application topically 2 (two) times daily. Started by: Felix Pacini   furosemide 20 MG tablet Commonly known as: LASIX TAKE 1 TABLET BY  MOUTH EVERY DAY FOR UP TO 7 DAYS THEN  AS NEEDED FOR LEG EDEMA   furosemide 20 MG tablet Commonly known as: LASIX Take 1 tablet (20 mg total) by mouth daily as needed.   Klor-Con M10 10 MEQ tablet Generic drug: potassium chloride TAKE 1 TABLET BY MOUTH TWICE A DAY TAKE ALONG WITH FUROSEMIDE ONLY   lidocaine 4 % Commonly known as: HM Lidocaine Patch Place 2 patches onto the skin daily.   lidocaine-prilocaine cream Commonly known as: EMLA APPLY 2 GRAMS TO PORT-A-CATH SITE 30-60 MINUTES PRIOR TO PORT ACCESS AS NEEDED   LORazepam 1 MG tablet Commonly known as: ATIVAN TAKE 1 TABLET BY MOUTH EVERY 8 HOURS AS NEEDED FOR ANXIETY   metoprolol succinate 25 MG 24 hr tablet Commonly known as: TOPROL-XL Take 1 tablet (25 mg total) by mouth daily.   Narcan 4 MG/0.1ML Liqd nasal spray kit Generic drug: naloxone Place 1 spray into the nose once as needed (overdose).   omeprazole 20 MG capsule Commonly known as: PRILOSEC Take 20 mg by mouth daily.   ondansetron 8 MG disintegrating tablet Commonly known as: ZOFRAN-ODT Take 0.5 tablets (4 mg total) by mouth every 8 (eight) hours as needed for nausea or vomiting.   onetouch ultrasoft lancets Monitor blood sugars twice daily   OneTouch Verio test strip Generic drug: glucose blood Monitor blood sugars twice daily   Oxycodone HCl 10 MG Tabs Take 1 tablet (10 mg total) by mouth 2 (two) times daily as needed. What changed: Another medication with the same name was changed. Make sure you understand how and when to take each. Changed by: Felix Pacini   Oxycodone HCl 10 MG Tabs Take 1 tablet (10 mg total) by mouth 2 (two) times daily as needed. Start taking on: January 24, 2023 What changed: These instructions start on January 24, 2023. If you are unsure what to do until then, ask your doctor or other care provider. Changed by: Felix Pacini   Oxycodone HCl 10 MG Tabs Take 1 tablet (10 mg total) by mouth 2 (two) times daily as needed. Start taking on: February 21, 2023 What  changed: These instructions start on February 21, 2023. If you are unsure what to do until then, ask your doctor or other care provider. Changed by: Felix Pacini   PARoxetine 10 MG tablet Commonly known as: PAXIL Take by mouth.   polyethylene glycol powder 17 GM/SCOOP powder Commonly known as: GLYCOLAX/MIRALAX Dissolve 1 capful (17 g) in water and drink 2 (two) times daily. What changed: when to take this   pregabalin 150 MG capsule Commonly known as: Lyrica Take 1 capsule (150 mg total) by mouth 2 (two) times daily.   prochlorperazine 10 MG tablet Commonly known as: COMPAZINE Take 1 tablet (10 mg total) by mouth every 6 (six) hours as needed.   promethazine-dextromethorphan 6.25-15 MG/5ML syrup Commonly known as: PROMETHAZINE-DM Take 5 mLs by mouth 4 (four) times daily as needed.   Senexon-S 8.6-50 MG tablet Generic drug: senna-docusate TAKE 2 TABLETS BY MOUTH AT BEDTIME AS NEEDED FOR MILD CONSTIPATION.   sitaGLIPtin 50 MG tablet Commonly known as: Januvia Take 1 tablet (50 mg total) by mouth daily. Started by: Felix Pacini        All past medical history, surgical history, allergies, family history, immunizations and medications were updated in the EMR today and reviewed under the history and medication portions of their EMR.      ROS: Negative, with the exception of above mentioned in HPI  Objective:  BP 114/71   Pulse 70   Temp 97.9 F (36.6 C)   Wt 155 lb 9.6 oz (70.6 kg)   SpO2 98%   BMI 25.11 kg/m  Body mass index is 25.11 kg/m. Physical Exam Vitals and nursing note reviewed.  Constitutional:      General: She is not in acute distress.    Appearance: Normal appearance. She is not ill-appearing, toxic-appearing or diaphoretic.  HENT:     Head: Normocephalic and atraumatic.  Eyes:     General: No scleral icterus.       Right eye: No discharge.        Left eye: No discharge.     Extraocular Movements: Extraocular movements intact.      Conjunctiva/sclera: Conjunctivae normal.     Pupils: Pupils are equal, round, and reactive to light.  Cardiovascular:     Rate and Rhythm: Normal rate and regular rhythm.  Pulmonary:     Effort: Pulmonary effort is normal. No respiratory distress.     Breath sounds: Normal breath sounds. No wheezing, rhonchi or rales.  Musculoskeletal:     Right lower leg: No edema.     Left lower leg: No edema.  Skin:    General: Skin is warm and dry.     Coloration: Skin is not jaundiced or pale.     Findings: No erythema or rash.  Neurological:     Mental Status: She is alert and oriented to person, place, and time. Mental status is at baseline.     Motor: No weakness.     Gait: Gait normal.  Psychiatric:        Mood and Affect: Mood normal.        Behavior: Behavior normal.        Thought Content: Thought content normal.        Judgment: Judgment normal.     Assessment/Plan: Kathleen Gibson is a 72 y.o. female present for OV for Chronic Conditions/illness Management ANEMIA, PERNICIOUS Continue B12 injections every 4 weeks indefinitely-Per oncology team has been completing these for her since she has been getting infusions as well for her leukemia. We will be happy to complete here once she is finished with infusions.  If she desires.  INSOMNIA, CHRONIC Ambien being  placed on hold for now during use of muscle relaxers and opioids. Strongly warned her of OD risk with benzo (ativan prescribed by onc) and opiate classes together.  She will try to decrease her ativan dose and will separate doses of the two meds.  Thrombocytopenia (HCC)/Chronic myelomonocytic leukemia not having achieved remission (HCC) Has routine follow-ups with her oncology team. Oncology team prescribes: Ativan, Lasix, Paxil, Flexeril, potassium, Compazine, senexon  Intractable back pain/Compression fracture of body of thoracic vertebra (HCC) Stable Continue oxy twice daily as needed, routinely using before bed daily and  only if needed during the day. Patient was encouraged to continue Senokot 1-2 tabs nightly to avoid constipation with opiate use. Continue Lyrica 150 mg twice daily   Type 2 diabetes mellitus with diabetic polyneuropathy, without long-term current use of insulin (HCC)/hyperlipidemia Stable Dc metformin (DC janumet) Continue Januvia  50 mg qd - POCT HgB A1C>7.3 > 7.4> 6.7> 5.6 collected today  Hypertension: Stable Encouraged her to hydrate. Continue metoprolol 25 mg daily.    Hereditary and idiopathic peripheral neuropathy Continue Lyrica Continue B12- if desired  Return in about 15 weeks (around 04/11/2023) for Routine chronic condition follow-up.   Reviewed expectations re: course of current  medical issues. Discussed self-management of symptoms. Outlined signs and symptoms indicating need for more acute intervention. Patient verbalized understanding and all questions were answered. Patient received an After-Visit Summary. Any changes in medications were reviewed and patient was provided with updated med list with their AVS.     Orders Placed This Encounter  Procedures   POCT glycosylated hemoglobin (Hb A1C)   Meds ordered this encounter  Medications   Oxycodone HCl 10 MG TABS    Sig: Take 1 tablet (10 mg total) by mouth 2 (two) times daily as needed.    Dispense:  60 tablet    Refill:  0    #1:3   Oxycodone HCl 10 MG TABS    Sig: Take 1 tablet (10 mg total) by mouth 2 (two) times daily as needed.    Dispense:  60 tablet    Refill:  0    #2:3   Oxycodone HCl 10 MG TABS    Sig: Take 1 tablet (10 mg total) by mouth 2 (two) times daily as needed.    Dispense:  60 tablet    Refill:  0    #3:3   pregabalin (LYRICA) 150 MG capsule    Sig: Take 1 capsule (150 mg total) by mouth 2 (two) times daily.    Dispense:  180 capsule    Refill:  1   metoprolol succinate (TOPROL-XL) 25 MG 24 hr tablet    Sig: Take 1 tablet (25 mg total) by mouth daily.    Dispense:  90 tablet     Refill:  1   sitaGLIPtin (JANUVIA) 50 MG tablet    Sig: Take 1 tablet (50 mg total) by mouth daily.    Dispense:  90 tablet    Refill:  1   fluocinonide cream (LIDEX) 0.05 %    Sig: Apply 1 Application topically 2 (two) times daily.    Dispense:  60 g    Refill:  1    Note is dictated utilizing voice recognition software. Although note has been proof read prior to signing, occasional typographical errors still can be missed. If any questions arise, please do not hesitate to call for verification.   electronically signed by:  Felix Pacini, DO  [facility] - OR

## 2022-12-28 ENCOUNTER — Inpatient Hospital Stay: Payer: Medicare HMO

## 2022-12-28 ENCOUNTER — Other Ambulatory Visit: Payer: Self-pay

## 2022-12-28 VITALS — BP 99/58 | HR 69 | Temp 98.1°F | Resp 16

## 2022-12-28 DIAGNOSIS — D693 Immune thrombocytopenic purpura: Secondary | ICD-10-CM | POA: Diagnosis not present

## 2022-12-28 DIAGNOSIS — C9310 Chronic myelomonocytic leukemia not having achieved remission: Secondary | ICD-10-CM | POA: Diagnosis not present

## 2022-12-28 DIAGNOSIS — D696 Thrombocytopenia, unspecified: Secondary | ICD-10-CM

## 2022-12-28 DIAGNOSIS — Z95828 Presence of other vascular implants and grafts: Secondary | ICD-10-CM

## 2022-12-28 DIAGNOSIS — Z7984 Long term (current) use of oral hypoglycemic drugs: Secondary | ICD-10-CM | POA: Diagnosis not present

## 2022-12-28 DIAGNOSIS — Z79899 Other long term (current) drug therapy: Secondary | ICD-10-CM | POA: Diagnosis not present

## 2022-12-28 DIAGNOSIS — R21 Rash and other nonspecific skin eruption: Secondary | ICD-10-CM | POA: Diagnosis not present

## 2022-12-28 DIAGNOSIS — Z9884 Bariatric surgery status: Secondary | ICD-10-CM | POA: Diagnosis not present

## 2022-12-28 DIAGNOSIS — Z5111 Encounter for antineoplastic chemotherapy: Secondary | ICD-10-CM | POA: Diagnosis not present

## 2022-12-28 DIAGNOSIS — Z7952 Long term (current) use of systemic steroids: Secondary | ICD-10-CM | POA: Diagnosis not present

## 2022-12-28 LAB — CBC WITH DIFFERENTIAL (CANCER CENTER ONLY)
Abs Immature Granulocytes: 0.08 10*3/uL — ABNORMAL HIGH (ref 0.00–0.07)
Basophils Absolute: 0 10*3/uL (ref 0.0–0.1)
Basophils Relative: 1 %
Eosinophils Absolute: 0.2 10*3/uL (ref 0.0–0.5)
Eosinophils Relative: 2 %
HCT: 29.4 % — ABNORMAL LOW (ref 36.0–46.0)
Hemoglobin: 9.9 g/dL — ABNORMAL LOW (ref 12.0–15.0)
Immature Granulocytes: 1 %
Lymphocytes Relative: 10 %
Lymphs Abs: 0.6 10*3/uL — ABNORMAL LOW (ref 0.7–4.0)
MCH: 30.2 pg (ref 26.0–34.0)
MCHC: 33.7 g/dL (ref 30.0–36.0)
MCV: 89.6 fL (ref 80.0–100.0)
Monocytes Absolute: 1.7 10*3/uL — ABNORMAL HIGH (ref 0.1–1.0)
Monocytes Relative: 26 %
Neutro Abs: 3.9 10*3/uL (ref 1.7–7.7)
Neutrophils Relative %: 60 %
Platelet Count: 72 10*3/uL — ABNORMAL LOW (ref 150–400)
RBC: 3.28 MIL/uL — ABNORMAL LOW (ref 3.87–5.11)
RDW: 20.4 % — ABNORMAL HIGH (ref 11.5–15.5)
WBC Count: 6.4 10*3/uL (ref 4.0–10.5)
nRBC: 0 % (ref 0.0–0.2)

## 2022-12-28 LAB — SAMPLE TO BLOOD BANK

## 2022-12-28 MED ORDER — ROMIPLOSTIM INJECTION 500 MCG
700.0000 ug | Freq: Once | SUBCUTANEOUS | Status: AC
  Administered 2022-12-28: 700 ug via SUBCUTANEOUS
  Filled 2022-12-28: qty 1.4

## 2022-12-28 MED ORDER — HEPARIN SOD (PORK) LOCK FLUSH 100 UNIT/ML IV SOLN
500.0000 [IU] | Freq: Once | INTRAVENOUS | Status: AC
  Administered 2022-12-28: 500 [IU]

## 2022-12-28 MED ORDER — SODIUM CHLORIDE 0.9% FLUSH
10.0000 mL | Freq: Once | INTRAVENOUS | Status: AC
  Administered 2022-12-28: 10 mL

## 2022-12-29 DIAGNOSIS — R768 Other specified abnormal immunological findings in serum: Secondary | ICD-10-CM | POA: Diagnosis not present

## 2022-12-29 DIAGNOSIS — D696 Thrombocytopenia, unspecified: Secondary | ICD-10-CM | POA: Diagnosis not present

## 2022-12-29 DIAGNOSIS — C9310 Chronic myelomonocytic leukemia not having achieved remission: Secondary | ICD-10-CM | POA: Diagnosis not present

## 2023-01-04 ENCOUNTER — Other Ambulatory Visit: Payer: Self-pay

## 2023-01-04 ENCOUNTER — Inpatient Hospital Stay: Payer: Medicare HMO

## 2023-01-04 VITALS — BP 118/61 | HR 79 | Resp 16

## 2023-01-04 DIAGNOSIS — Z79899 Other long term (current) drug therapy: Secondary | ICD-10-CM | POA: Diagnosis not present

## 2023-01-04 DIAGNOSIS — C9310 Chronic myelomonocytic leukemia not having achieved remission: Secondary | ICD-10-CM

## 2023-01-04 DIAGNOSIS — Z7952 Long term (current) use of systemic steroids: Secondary | ICD-10-CM | POA: Diagnosis not present

## 2023-01-04 DIAGNOSIS — Z95828 Presence of other vascular implants and grafts: Secondary | ICD-10-CM

## 2023-01-04 DIAGNOSIS — R21 Rash and other nonspecific skin eruption: Secondary | ICD-10-CM | POA: Diagnosis not present

## 2023-01-04 DIAGNOSIS — Z5111 Encounter for antineoplastic chemotherapy: Secondary | ICD-10-CM | POA: Diagnosis not present

## 2023-01-04 DIAGNOSIS — D693 Immune thrombocytopenic purpura: Secondary | ICD-10-CM | POA: Diagnosis not present

## 2023-01-04 DIAGNOSIS — D696 Thrombocytopenia, unspecified: Secondary | ICD-10-CM

## 2023-01-04 DIAGNOSIS — Z7984 Long term (current) use of oral hypoglycemic drugs: Secondary | ICD-10-CM | POA: Diagnosis not present

## 2023-01-04 DIAGNOSIS — Z9884 Bariatric surgery status: Secondary | ICD-10-CM | POA: Diagnosis not present

## 2023-01-04 LAB — CMP (CANCER CENTER ONLY)
ALT: 18 U/L (ref 0–44)
AST: 23 U/L (ref 15–41)
Albumin: 2.8 g/dL — ABNORMAL LOW (ref 3.5–5.0)
Alkaline Phosphatase: 140 U/L — ABNORMAL HIGH (ref 38–126)
Anion gap: 5 (ref 5–15)
BUN: 15 mg/dL (ref 8–23)
CO2: 28 mmol/L (ref 22–32)
Calcium: 8.2 mg/dL — ABNORMAL LOW (ref 8.9–10.3)
Chloride: 110 mmol/L (ref 98–111)
Creatinine: 0.75 mg/dL (ref 0.44–1.00)
GFR, Estimated: >60 mL/min (ref >60)
Glucose, Bld: 109 mg/dL — ABNORMAL HIGH (ref 70–99)
Potassium: 3.8 mmol/L (ref 3.5–5.1)
Sodium: 143 mmol/L (ref 135–145)
Total Bilirubin: 1.7 mg/dL — ABNORMAL HIGH (ref 0.3–1.2)
Total Protein: 6.1 g/dL — ABNORMAL LOW (ref 6.5–8.1)

## 2023-01-04 LAB — CBC WITH DIFFERENTIAL (CANCER CENTER ONLY)
Abs Immature Granulocytes: 0.07 10*3/uL (ref 0.00–0.07)
Basophils Absolute: 0 10*3/uL (ref 0.0–0.1)
Basophils Relative: 1 %
Eosinophils Absolute: 0.1 10*3/uL (ref 0.0–0.5)
Eosinophils Relative: 2 %
HCT: 28 % — ABNORMAL LOW (ref 36.0–46.0)
Hemoglobin: 9.7 g/dL — ABNORMAL LOW (ref 12.0–15.0)
Immature Granulocytes: 2 %
Lymphocytes Relative: 12 %
Lymphs Abs: 0.5 10*3/uL — ABNORMAL LOW (ref 0.7–4.0)
MCH: 32 pg (ref 26.0–34.0)
MCHC: 34.6 g/dL (ref 30.0–36.0)
MCV: 92.4 fL (ref 80.0–100.0)
Monocytes Absolute: 1.2 10*3/uL — ABNORMAL HIGH (ref 0.1–1.0)
Monocytes Relative: 30 %
Neutro Abs: 2.1 10*3/uL (ref 1.7–7.7)
Neutrophils Relative %: 53 %
Platelet Count: 80 10*3/uL — ABNORMAL LOW (ref 150–400)
RBC: 3.03 MIL/uL — ABNORMAL LOW (ref 3.87–5.11)
RDW: 20.4 % — ABNORMAL HIGH (ref 11.5–15.5)
WBC Count: 3.9 10*3/uL — ABNORMAL LOW (ref 4.0–10.5)
nRBC: 0 % (ref 0.0–0.2)

## 2023-01-04 LAB — SAMPLE TO BLOOD BANK

## 2023-01-04 MED ORDER — HEPARIN SOD (PORK) LOCK FLUSH 100 UNIT/ML IV SOLN
500.0000 [IU] | Freq: Once | INTRAVENOUS | Status: AC
  Administered 2023-01-04: 500 [IU]

## 2023-01-04 MED ORDER — ROMIPLOSTIM INJECTION 500 MCG
9.9500 ug/kg | Freq: Once | SUBCUTANEOUS | Status: AC
  Administered 2023-01-04: 700 ug via SUBCUTANEOUS
  Filled 2023-01-04: qty 1

## 2023-01-04 MED ORDER — SODIUM CHLORIDE 0.9% FLUSH
10.0000 mL | Freq: Once | INTRAVENOUS | Status: AC
  Administered 2023-01-04: 10 mL

## 2023-01-08 NOTE — Assessment & Plan Note (Addendum)
-  diagnosed in 10/2020, presented with severe thrombocytopenia  --She began azacitidine 75mg /m2 daily days 1-5 q. 28 days started on 12/28/20 -She had worsening thrombocytopenia after stopping Nplate, so it was restarted and will continue weekly  -Bone marrow biopsy 03/18/21 showed stable disease, no increase in blasts. -she is tolerating treatment well, blood counts are overall stable  -In 09/2022, she received a decreased dose of Azacitidine at 60 mg/m2 due to worsening thrombocytopenia, back on full dose in May, thrombocytopenia has improved, she is tolerating it well. -she has been following Dr. Sharyne Richters at [facility] also  -Zometa has been held due to her dental issue

## 2023-01-09 ENCOUNTER — Inpatient Hospital Stay: Payer: Medicare HMO

## 2023-01-09 ENCOUNTER — Other Ambulatory Visit: Payer: Self-pay

## 2023-01-09 ENCOUNTER — Inpatient Hospital Stay (HOSPITAL_BASED_OUTPATIENT_CLINIC_OR_DEPARTMENT_OTHER): Payer: Medicare HMO | Admitting: Hematology

## 2023-01-09 VITALS — BP 137/66 | HR 87 | Temp 98.0°F | Resp 15 | Ht 66.0 in | Wt 153.9 lb

## 2023-01-09 DIAGNOSIS — C9310 Chronic myelomonocytic leukemia not having achieved remission: Secondary | ICD-10-CM

## 2023-01-09 DIAGNOSIS — Z79899 Other long term (current) drug therapy: Secondary | ICD-10-CM | POA: Diagnosis not present

## 2023-01-09 DIAGNOSIS — D693 Immune thrombocytopenic purpura: Secondary | ICD-10-CM | POA: Diagnosis not present

## 2023-01-09 DIAGNOSIS — Z5111 Encounter for antineoplastic chemotherapy: Secondary | ICD-10-CM | POA: Diagnosis not present

## 2023-01-09 DIAGNOSIS — D696 Thrombocytopenia, unspecified: Secondary | ICD-10-CM

## 2023-01-09 DIAGNOSIS — Z7984 Long term (current) use of oral hypoglycemic drugs: Secondary | ICD-10-CM | POA: Diagnosis not present

## 2023-01-09 DIAGNOSIS — Z9884 Bariatric surgery status: Secondary | ICD-10-CM | POA: Diagnosis not present

## 2023-01-09 DIAGNOSIS — R21 Rash and other nonspecific skin eruption: Secondary | ICD-10-CM

## 2023-01-09 DIAGNOSIS — Z7952 Long term (current) use of systemic steroids: Secondary | ICD-10-CM | POA: Diagnosis not present

## 2023-01-09 DIAGNOSIS — Z95828 Presence of other vascular implants and grafts: Secondary | ICD-10-CM

## 2023-01-09 LAB — CMP (CANCER CENTER ONLY)
ALT: 21 U/L (ref 0–44)
AST: 31 U/L (ref 15–41)
Albumin: 2.8 g/dL — ABNORMAL LOW (ref 3.5–5.0)
Alkaline Phosphatase: 143 U/L — ABNORMAL HIGH (ref 38–126)
Anion gap: 8 (ref 5–15)
BUN: 17 mg/dL (ref 8–23)
CO2: 26 mmol/L (ref 22–32)
Calcium: 8 mg/dL — ABNORMAL LOW (ref 8.9–10.3)
Chloride: 109 mmol/L (ref 98–111)
Creatinine: 0.78 mg/dL (ref 0.44–1.00)
GFR, Estimated: >60 mL/min (ref >60)
Glucose, Bld: 147 mg/dL — ABNORMAL HIGH (ref 70–99)
Potassium: 3.4 mmol/L — ABNORMAL LOW (ref 3.5–5.1)
Sodium: 143 mmol/L (ref 135–145)
Total Bilirubin: 2 mg/dL — ABNORMAL HIGH (ref 0.3–1.2)
Total Protein: 6.3 g/dL — ABNORMAL LOW (ref 6.5–8.1)

## 2023-01-09 LAB — CBC WITH DIFFERENTIAL (CANCER CENTER ONLY)
Abs Immature Granulocytes: 0.15 10*3/uL — ABNORMAL HIGH (ref 0.00–0.07)
Basophils Absolute: 0.1 10*3/uL (ref 0.0–0.1)
Basophils Relative: 1 %
Eosinophils Absolute: 0.1 10*3/uL (ref 0.0–0.5)
Eosinophils Relative: 3 %
HCT: 32 % — ABNORMAL LOW (ref 36.0–46.0)
Hemoglobin: 10.6 g/dL — ABNORMAL LOW (ref 12.0–15.0)
Immature Granulocytes: 3 %
Lymphocytes Relative: 12 %
Lymphs Abs: 0.5 10*3/uL — ABNORMAL LOW (ref 0.7–4.0)
MCH: 29.8 pg (ref 26.0–34.0)
MCHC: 33.1 g/dL (ref 30.0–36.0)
MCV: 89.9 fL (ref 80.0–100.0)
Monocytes Absolute: 1.2 10*3/uL — ABNORMAL HIGH (ref 0.1–1.0)
Monocytes Relative: 26 %
Neutro Abs: 2.4 10*3/uL (ref 1.7–7.7)
Neutrophils Relative %: 55 %
Platelet Count: 89 10*3/uL — ABNORMAL LOW (ref 150–400)
RBC: 3.56 MIL/uL — ABNORMAL LOW (ref 3.87–5.11)
RDW: 19.9 % — ABNORMAL HIGH (ref 11.5–15.5)
WBC Count: 4.4 10*3/uL (ref 4.0–10.5)
nRBC: 0 % (ref 0.0–0.2)

## 2023-01-09 LAB — SAMPLE TO BLOOD BANK

## 2023-01-09 MED ORDER — SODIUM CHLORIDE 0.9 % IV SOLN
10.0000 mg | Freq: Once | INTRAVENOUS | Status: AC
  Administered 2023-01-09: 10 mg via INTRAVENOUS
  Filled 2023-01-09: qty 10

## 2023-01-09 MED ORDER — TRIAMCINOLONE ACETONIDE 0.5 % EX OINT: 1.0000 | TOPICAL_OINTMENT | Freq: Two times a day (BID) | CUTANEOUS | 0 refills | Status: DC

## 2023-01-09 MED ORDER — SODIUM CHLORIDE 0.9 % IV SOLN: Freq: Once | INTRAVENOUS | Status: AC

## 2023-01-09 MED ORDER — SODIUM CHLORIDE 0.9 % IV SOLN
75.0000 mg/m2 | Freq: Once | INTRAVENOUS | Status: AC
  Administered 2023-01-09: 130 mg via INTRAVENOUS
  Filled 2023-01-09: qty 13

## 2023-01-09 MED ORDER — SODIUM CHLORIDE 0.9% FLUSH
10.0000 mL | INTRAVENOUS | Status: DC | PRN
  Administered 2023-01-09: 10 mL

## 2023-01-09 MED ORDER — HEPARIN SOD (PORK) LOCK FLUSH 100 UNIT/ML IV SOLN
500.0000 [IU] | Freq: Once | INTRAVENOUS | Status: AC | PRN
  Administered 2023-01-09: 500 [IU]

## 2023-01-09 MED ORDER — SODIUM CHLORIDE 0.9% FLUSH
10.0000 mL | Freq: Once | INTRAVENOUS | Status: AC
  Administered 2023-01-09: 10 mL

## 2023-01-09 NOTE — Progress Notes (Signed)
Patient seen by Dr. America Brown are within treatment parameters.  Labs reviewed:  PLT 89 and Bilirubin 2.0  Per physician team, patient is ready for treatment and there are NO modifications to the treatment plan.

## 2023-01-09 NOTE — Assessment & Plan Note (Signed)
-  started in late June 2024, she has diffuse small macular rash in her legs, very itchy -Not sure about the cause, drug rash from Azacitadine is possible, but is unusual since she has been on it for so long -she has tried topical hydrocortisone and allergy medicine, which does not adequately control her symptoms -I called in Medrol dose pack but it did not help -Discussed a dermatology referral, versus seeing her PCP.  She will call her PCP first.

## 2023-01-09 NOTE — Patient Instructions (Signed)
[facility]  Discharge Instructions: Thank you for choosing [facility] to provide your oncology and hematology care.   If you have a lab appointment with the Cancer Center, please go directly to the Cancer Center and check in at the registration area.   Wear comfortable clothing and clothing appropriate for easy access to any Portacath or PICC line.   We strive to give you quality time with your provider. You may need to reschedule your appointment if you arrive late (15 or more minutes).  Arriving late affects you and other patients whose appointments are after yours.  Also, if you miss three or more appointments without notifying the office, you may be dismissed from the clinic at the provider's discretion.      For prescription refill requests, have your pharmacy contact our office and allow 72 hours for refills to be completed.    Today you received the following chemotherapy and/or immunotherapy agents: Vidaza     To help prevent nausea and vomiting after your treatment, we encourage you to take your nausea medication as directed.  BELOW ARE SYMPTOMS THAT SHOULD BE REPORTED IMMEDIATELY: *FEVER GREATER THAN 100.4 F (38 C) OR HIGHER *CHILLS OR SWEATING *NAUSEA AND VOMITING THAT IS NOT CONTROLLED WITH YOUR NAUSEA MEDICATION *UNUSUAL SHORTNESS OF BREATH *UNUSUAL BRUISING OR BLEEDING *URINARY PROBLEMS (pain or burning when urinating, or frequent urination) *BOWEL PROBLEMS (unusual diarrhea, constipation, pain near the anus) TENDERNESS IN MOUTH AND THROAT WITH OR WITHOUT PRESENCE OF ULCERS (sore throat, sores in mouth, or a toothache) UNUSUAL RASH, SWELLING OR PAIN  UNUSUAL VAGINAL DISCHARGE OR ITCHING   Items with * indicate a potential emergency and should be followed up as soon as possible or go to the Emergency Department if any problems should occur.  Please show the CHEMOTHERAPY ALERT CARD or IMMUNOTHERAPY ALERT CARD at check-in  to the Emergency Department and triage nurse.  Should you have questions after your visit or need to cancel or reschedule your appointment, please contact [facility]  Dept: [phone]  and follow the prompts.  Office hours are 8:00 a.m. to 4:30 p.m. Monday - Friday. Please note that voicemails left after 4:00 p.m. may not be returned until the following business day.  We are closed weekends and major holidays. You have access to a nurse at all times for urgent questions. Please call the main number to the clinic Dept: [phone] and follow the prompts.   For any non-urgent questions, you may also contact your provider using MyChart. We now offer e-Visits for anyone 18 and older to request care online for non-urgent symptoms. For details visit [email or web address].   Also download the MyChart app! Go to the app store, search "MyChart", open the app, select [place], and log in with your MyChart username and password.  

## 2023-01-09 NOTE — Progress Notes (Signed)
Per Mosetta Putt MD, ok to treat with PLT and Bilirubin today.

## 2023-01-09 NOTE — Progress Notes (Signed)
[facility]   [phone] Fax:([phone]   Clinic Follow up Note   Patient Care Team: Natalia Leatherwood, DO as PCP - General (Family Medicine) Myrtie Neither Andreas Blower, MD as Consulting Physician (Gastroenterology) Malachy Mood, MD as Consulting Physician (Hematology) Glendale Chard, DO as Consulting Physician (Neurology)  Date of Service:  01/09/2023  CHIEF COMPLAINT: f/u of [name]  CURRENT THERAPY:  Azacitidine days 1-5 q28 days, started 12/28/20 -Nplate 47mcg/kg weekly -Platelet transfusion as needed (plt<15 -20K), blood transfusion if Hg<8.0  ASSESSMENT:  Kathleen Gibson is a 72 y.o. female with   [name] (chronic myelomonocytic leukemia) (HCC) -diagnosed in 10/2020, presented with severe thrombocytopenia  --She began azacitidine 75mg /m2 daily days 1-5 q. 28 days started on 12/28/20 -She had worsening thrombocytopenia after stopping Nplate, so it was restarted and will continue weekly  -Bone marrow biopsy 03/18/21 showed stable disease, no increase in blasts. -she is tolerating treatment well, blood counts are overall stable  -In 09/2022, she received a decreased dose of Azacitidine at 60 mg/m2 due to worsening thrombocytopenia, back on full dose in May, thrombocytopenia has improved, she is tolerating it well. -she has been following Dr. Sharyne Richters at [facility] also  -Zometa has been held due to her dental issue  Skin rash -started in late June 2024, she has diffuse small macular rash in her legs, very itchy -Not sure about the cause, drug rash from Azacitadine is possible, but is unusual since she has been on it for so long -she has tried topical hydrocortisone and allergy medicine, which does not adequately control her symptoms -I called in Medrol dose pack but it did not help -Discussed a dermatology referral, versus seeing her PCP.  She will call her PCP first.  Abdominal bloating -She noticed more bloating lately -Her CT scan in April 2024 showed probable liver  cirrhosis and splenomegaly, she also had mild ascites -She has significant hepatomegaly and splenomegaly on exam today, will order ultrasound to evaluate her liver, spleen and ascites   PLAN: -lab reviewed -platelet 89. HG 10.6 -CMP -pending -Proceed with Vidaza today -I prescribe Kenalog lotion for her rash in her legs  -Continue lab and Nplate weekly -Follow-up in 4 weeks before next cycle of Vidaza -abdominal US in next 1-2 weeks    SUMMARY OF ONCOLOGIC HISTORY: Oncology History  [name] (chronic myelomonocytic leukemia) (HCC)  10/29/2020 Imaging   CT CAP  IMPRESSION: 1. Splenomegaly with pathologically enlarged lymph nodes above and below the diaphragm, with overall stable to minimally increased abdominal adenopathy and interval progression of the pelvic adenopathy. 2. Small volume abdominopelvic ascites with diffuse mesenteric stranding. 3. Scattered bilateral pulmonary micro nodules measuring 1-2 mm. 4. Distended gallbladder with some layering hyperdense material representing layering sludge and tiny stones seen on prior ultrasound. 5. Aortic atherosclerosis.   10/30/2020 Pathology Results   DIAGNOSIS:   BONE MARROW, ASPIRATE, CLOT, CORE:  -  Hypercellular bone marrow with panhyperplasia, atypia, and no  increase in blasts  -  See comment   PERIPHERAL BLOOD:  -  Marked thrombocytopenia  -  Absolute monocytosis  -  Normocytic anemia  -  See CBC data and comment   COMMENT:  The bone marrow is hypercellular for age (approximately 80%) with myeloid hyperplasia with maturational left shift, erythroid hyperplasia, and increased megakaryocytes.  Mild multilineage atypia is present. Blasts are not increased on aspirate smears (1% by manual differential count) or by CD34 immunohistochemistry on the core biopsy.  Concurrent flow cytometric analysis  of the bone marrow aspirate demonstrates increased monocytes, and no increase in blasts or abnormal lymphoid population (see  [phone]).  Monocytes are also increased in peripheral  blood, persistent per electronic medical record.  In aggregate, the  findings raise the possibility of a myeloid neoplasm with the  differential diagnosis including a low-grade myelodysplastic syndrome and chronic myelomonocytic leukemia (dysplastic type).    ADDENDUM:  A reticulin special stain performed on the bone marrow core biopsy reveals no significant increase in reticulin fibrosis.  ADDENDUM:  CYTOGENETIC RESULTS:  Karyotype: 46,XX[20]  Interpretation: NORMAL FEMALE KARYOTYPE   FISH RESULTS:  Results: NORMAL   ADDENDUM:  CD123 immunohistochemistry performed on the core biopsy highlights scattered aggregates of positively staining cells consistent with plasmacytoid dendritic cells.    10/30/2020 Pathology Results   DIAGNOSIS:   BONE MARROW; FLOW CYTOMETRIC ANALYSIS:  -  Increased monocytes  -  Scant B-cells present  -  No immunophenotypically aberrant T-cell population identified  -  No increase in blasts  -  See comment   COMMENT:  Monocytes are relatively increased (12% of all cells), without aberrant expression of CD56.  B-cells comprise <1% of total lymphocytes. CD34-positive blasts are not increased (<1% of all cells).  Correlation with concurrent morphology is recommended for complete diagnostic interpretation and overall blast enumeration (see [id number]).    10/30/2020 Pathology Results   FINAL MICROSCOPIC DIAGNOSIS:   A. LYMPH NODE, RIGHT AXILLARY, NEEDLE CORE BIOPSY:  -Lymphoid tissue present  -See comment   COMMENT:  The sections show several small needle core biopsy fragments of lymphoid tissue displaying degenerative cellular changes/necrosis and hence cannot be accurately evaluated.  Sample for flow cytometric analysis not available.  Immunohistochemical stain for CD3 and CD20 were performed with appropriate controls.  There is a mixture of T and B cells in their apparently respective compartments.   There is no definite metastatic malignancy.    11/11/2020 Pathology Results   DIAGNOSIS:   LEFT AXILLARY LYMPH NODE EXCISIONAL BIOPSY; FLOW CYTOMETRIC ANALYSIS:  -  No monotypic B-cell or immunophenotypically aberrant T-cell  population identified  -  See comment   COMMENT:  Flow cytometric analysis identifies B-cells with a normal kappa:lambda ratio of 1.7:1.  A subset of the polytypic B-cells expresses CD10. T-cells show a CD4:CD8 ratio of 3.3:1 without immunophenotypic aberrancy with the markers evaluated.  Although these results do not support the diagnosis of a clonal lymphoid population, sampling issues must always be considered when negative results are obtained, as focal lesions may not be represented in the specimen submitted.  In addition, flow cytometric immunophenotyping will not exclude other pathology if present (e.g. Hodgkin lymphoma, some T-cell lymphomas, metastatic and infectious diseases).   11/11/2020 Pathology Results   FINAL MICROSCOPIC DIAGNOSIS:   A. LYMPH NODE, LEFT AXILLARY, DISSECTION:  -  Follicular hyperplasia with interfollicular expansion  -  See comment   COMMENT:  Sections of the lymph nodes reveal generally preserved lymph node architecture.  There is follicular hyperplasia with some follicles showing increased hyalinization of germinal centers and mild concentric encircling of germinal centers with small lymphocytes (onion skinning). Interfollicular areas are expanded with increased vascular proliferation, small lymphocytes, plasma cells, histiocytic cells, and patchy neutrophils.  The lymph node capsule is variably thickened by fibrosis with few plasma cells.   A panel of immunohistochemical stains is performed for further  characterization.  CD3 and CD20/PAX5 highlight T-cell and B-cell  compartments, respectively.  Germinal centers are highlighted by CD10 and BCL6 with appropriate absent expression  of BCL2.  CD5 is similar to CD3.  CD43 also stains the  T-cells.  CD4-positive T-cells exceed CD8-positive T-cells.  CD30 highlights scattered immunoblasts.  CD21 reveals intact follicular dendritic cell meshworks.  CD68 highlights increased histiocytic cells.  HHV 8 is negative.  T. pallidum reveals no definitive organisms.  Pancytokeratin is negative.  TdT stains rare cells.  CD138 highlights plasma cells which are not increased in number and show polytypic light chain expression by kappa/lambda in situ  hybridization.  EBV is negative by in situ hybridization.   Concurrent flow cytometric analysis is negative for a monoclonal B-cell or immunophenotypically aberrant T-cell population (see [phone]).   Together, the findings above are non-specific and can be seen in  reactive and infectious processes as well as Castleman disease.  There is no definitive morphologic or flow cytometric evidence of involvement by a lymphoproliferative disorder from the current workup.   12/17/2020 Initial Diagnosis   [name] (chronic myelomonocytic leukemia) (HCC)   12/28/2020 - 02/03/2022 Chemotherapy   Patient is on Treatment Plan : MYELODYSPLASIA  Azacitidine IV D1-5 q28d     03/18/2021 Pathology Results   Final Diagnosis    BONE MARROW:             Persistent chronic myelomonocytic leukemia in a hypercellular bone marrow with increased monocytes and no increase in blasts.   Comment    Flow cytometric analysis showed no increase in blasts and 17% monocytes.A next generation myeloid panel showed the presence of the following mutations: NRAS, SH2B3, PHF6 and TET2. CD34 and CD117 immunstains do not show an increase in blasts. The findings are consistent with persistent [name] with a decrease in the number of blasts compared to that seen on the previous bone marrow.    Final Interpretation      BONE MARROW; FLOW CYTOMETRIC ANALYSIS:   No increased blasts identified. (see comment)  Monocytosis (17% of total events).    COMMENT: Flow cytometry identified about 0.6% of  total events as immature cells. These cells express CD45 (dim), CD34, CD117, HLA-DR, CD38, CD33 (variable). This immunophenotype is consistent with normal myeloblasts. In addition, monocytes are increased and comprise of approximately 17% of total events. These monocytes show normal expression of CD33/CD64/CD14/CD11b with variable expression of CD4. Correlation with concurrent morphology and clinical data is recommended (see [id number]).    FLOW CYTOMETRY ANALYSIS: CD45 versus side scatter analysis demonstrate a predominance of granulocytes (~70%), monocytes (~17%) and lymphocytes (~6%). No significant increase in blasts identified. All tested markers were used for adequate analysis of the cells and were appropriately reviewed.     12/28/2021 -  Chemotherapy   Patient is on Treatment Plan : MYELODYSPLASIA  Azacitidine IV D1-5 q28d        INTERVAL HISTORY:  Kathleen Gibson is here for a follow up of [name]. She was last seen by me on 12/21/2022. She presents to the clinic alone. Pt state that she is doing well. She also reports that her pain is getting better. Pt state that she feels bloated. Pt reports of the rash is still present from her lower extremity to her upper waist and across her back. Pt state that she takes Benadryl and hydrocortisone cream     All other systems were reviewed with the patient and are negative.  MEDICAL HISTORY:  Past Medical History:  Diagnosis Date   Acute bronchitis due to COVID-19 virus 05/16/2022   Diabetes (HCC)    HLD (hyperlipidemia)    Hypertension    Pernicious  anemia    Pneumonia 09/05/2019   Renal insufficiency 09/06/2019   Skin rash 12/21/2022   Vitamin D deficiency     SURGICAL HISTORY: Past Surgical History:  Procedure Laterality Date   ABDOMINAL HERNIA REPAIR  2009   ABDOMINOPLASTY     AXILLARY LYMPH NODE BIOPSY Left 11/11/2020   Procedure: LEFT AXILLARY EXCISIONAL LYMPH NODE BIOPSY;  Surgeon: Darnell Level, MD;  Location: [facility] OR;  Service:  General;  Laterality: Left;   CESAREAN SECTION  1978   GASTRIC BYPASS  2000   hemorroid surgery  2007   IR IMAGING GUIDED PORT INSERTION  07/26/2021   IR KYPHO EA ADDL LEVEL THORACIC OR LUMBAR  05/31/2021   IR KYPHO THORACIC WITH BONE BIOPSY  05/31/2021   TONSILLECTOMY AND ADENOIDECTOMY  1957    I have reviewed the social history and family history with the patient and they are unchanged from previous note.  ALLERGIES:  is allergic to asa [aspirin], nsaids, and red blood cells.  MEDICATIONS:  Current Outpatient Medications  Medication Sig Dispense Refill   triamcinolone ointment (KENALOG) 0.5 % Apply 1 Application topically 2 (two) times daily. 30 g 0   cyclobenzaprine (FLEXERIL) 5 MG tablet Take 1 tablet (5 mg total) by mouth 3 (three) times daily as needed for muscle spasms. 30 tablet 0   fluocinonide cream (LIDEX) 0.05 % Apply 1 Application topically 2 (two) times daily. 60 g 1   furosemide (LASIX) 20 MG tablet TAKE 1 TABLET BY MOUTH EVERY DAY FOR UP TO 7 DAYS THEN AS NEEDED FOR LEG EDEMA 90 tablet 1   furosemide (LASIX) 20 MG tablet Take 1 tablet (20 mg total) by mouth daily as needed. 20 tablet 0   glucose blood (ONETOUCH VERIO) test strip Monitor blood sugars twice daily 100 each 5   KLOR-CON M10 10 MEQ tablet TAKE 1 TABLET BY MOUTH TWICE A DAY TAKE ALONG WITH FUROSEMIDE ONLY 180 tablet 1   Lancets (ONETOUCH ULTRASOFT) lancets Monitor blood sugars twice daily 100 each 5   lidocaine (HM LIDOCAINE PATCH) 4 % Place 2 patches onto the skin daily. 60 patch 0   lidocaine-prilocaine (EMLA) cream APPLY 2 GRAMS TO PORT-A-CATH SITE 30-60 MINUTES PRIOR TO PORT ACCESS AS NEEDED 30 g 1   LORazepam (ATIVAN) 1 MG tablet TAKE 1 TABLET BY MOUTH EVERY 8 HOURS AS NEEDED FOR ANXIETY 30 tablet 0   metoprolol succinate (TOPROL-XL) 25 MG 24 hr tablet Take 1 tablet (25 mg total) by mouth daily. 90 tablet 1   naloxone (NARCAN) nasal spray 4 mg/0.1 mL Place 1 spray into the nose once as needed (overdose). 2  each 0   omeprazole (PRILOSEC) 20 MG capsule Take 20 mg by mouth daily.     ondansetron (ZOFRAN-ODT) 8 MG disintegrating tablet Take 0.5 tablets (4 mg total) by mouth every 8 (eight) hours as needed for nausea or vomiting. 30 tablet 3   Oxycodone HCl 10 MG TABS Take 1 tablet (10 mg total) by mouth 2 (two) times daily as needed. 60 tablet 0   [START ON 01/24/2023] Oxycodone HCl 10 MG TABS Take 1 tablet (10 mg total) by mouth 2 (two) times daily as needed. 60 tablet 0   [START ON 02/21/2023] Oxycodone HCl 10 MG TABS Take 1 tablet (10 mg total) by mouth 2 (two) times daily as needed. 60 tablet 0   PARoxetine (PAXIL) 10 MG tablet Take by mouth.     polyethylene glycol powder (GLYCOLAX/MIRALAX) 17 GM/SCOOP powder Dissolve 1 capful (17  g) in water and drink 2 (two) times daily. (Patient taking differently: Take 17 g by mouth daily.) 510 g 2   pregabalin (LYRICA) 150 MG capsule Take 1 capsule (150 mg total) by mouth 2 (two) times daily. 180 capsule 1   prochlorperazine (COMPAZINE) 10 MG tablet Take 1 tablet (10 mg total) by mouth every 6 (six) hours as needed. 30 tablet 3   promethazine-dextromethorphan (PROMETHAZINE-DM) 6.25-15 MG/5ML syrup Take 5 mLs by mouth 4 (four) times daily as needed. 118 mL 0   SENEXON-S 8.6-50 MG tablet TAKE 2 TABLETS BY MOUTH AT BEDTIME AS NEEDED FOR MILD CONSTIPATION. 60 tablet 1   sitaGLIPtin (JANUVIA) 50 MG tablet Take 1 tablet (50 mg total) by mouth daily. 90 tablet 1   No current facility-administered medications for this visit.   Facility-Administered Medications Ordered in Other Visits  Medication Dose Route Frequency Provider Last Rate Last Admin   0.9 %  sodium chloride infusion (Manually program via Guardrails IV Fluids)  250 mL Intravenous Once Malachy Mood, MD       azaCITIDine [facility]) 130 mg in sodium chloride 0.9 % 50 mL chemo infusion  75 mg/m2 (Treatment Plan Recorded) Intravenous Once Malachy Mood, MD       heparin lock flush 100 unit/mL  500 Units Intracatheter  Once PRN Malachy Mood, MD       sodium chloride flush (NS) 0.9 % injection 10 mL  10 mL Intracatheter PRN Malachy Mood, MD        PHYSICAL EXAMINATION: ECOG PERFORMANCE STATUS: 2 - Symptomatic, <50% confined to bed  Vitals:   01/09/23 0914  BP: 137/66  Pulse: 87  Resp: 15  Temp: 98 F (36.7 C)  SpO2: 100%   Wt Readings from Last 3 Encounters:  01/09/23 153 lb 14.4 oz (69.8 kg)  12/27/22 155 lb 9.6 oz (70.6 kg)  12/21/22 161 lb 3.2 oz (73.1 kg)     GENERAL:alert, no distress and comfortable SKIN: skin color normal, no rashes or significant lesions EYES: normal, Conjunctiva are pink and non-injected, sclera clear  NEURO: alert & oriented x 3 with fluent speech ABDOMEN:(+)abdomen soft, (-)non-tender and normal bowel sounds   LABORATORY DATA:  I have reviewed the data as listed    Latest Ref Rng & Units 01/09/2023    8:58 AM 01/04/2023   10:27 AM 12/28/2022   10:22 AM  CBC  WBC 4.0 - 10.5 K/uL 4.4  3.9  6.4   Hemoglobin 12.0 - 15.0 g/dL 16.1  9.7  9.9   Hematocrit 36.0 - 46.0 % 32.0  28.0  29.4   Platelets 150 - 400 K/uL 89  80  72         Latest Ref Rng & Units 01/09/2023    8:58 AM 01/04/2023   10:27 AM 12/07/2022    8:34 AM  CMP  Glucose 70 - 99 mg/dL 096  045  409   BUN 8 - 23 mg/dL 17  15  13    Creatinine 0.44 - 1.00 mg/dL 8.11  9.14  7.82   Sodium 135 - 145 mmol/L 143  143  140   Potassium 3.5 - 5.1 mmol/L 3.4  3.8  3.6   Chloride 98 - 111 mmol/L 109  110  107   CO2 22 - 32 mmol/L 26  28  24    Calcium 8.9 - 10.3 mg/dL 8.0  8.2  7.9   Total Protein 6.5 - 8.1 g/dL 6.3  6.1    Total Bilirubin 0.3 -  1.2 mg/dL 2.0  1.7    Alkaline Phos 38 - 126 U/L 143  140    AST 15 - 41 U/L 31  23    ALT 0 - 44 U/L 21  18        RADIOGRAPHIC STUDIES: I have personally reviewed the radiological images as listed and agreed with the findings in the report. No results found.    Orders Placed This Encounter  Procedures   US Abdomen Complete    Standing Status:   Future     Standing Expiration Date:   01/09/2024    Order Specific Question:   Reason for Exam (SYMPTOM  OR DIAGNOSIS REQUIRED)    Answer:   evaluation , liver and spleen    Order Specific Question:   Preferred imaging location?    Answer:   [facility]   CBC with Differential (Cancer Center Only)    Standing Status:   Future    Standing Expiration Date:   01/09/2024   Basic Metabolic Panel - Cancer Center Only    Standing Status:   Future    Standing Expiration Date:   01/09/2024   CBC with Differential (Cancer Center Only)    Standing Status:   Future    Standing Expiration Date:   02/05/2024   Basic Metabolic Panel - Cancer Center Only    Standing Status:   Future    Standing Expiration Date:   02/05/2024   CBC with Differential (Cancer Center Only)    Standing Status:   Future    Standing Expiration Date:   03/04/2024   Basic Metabolic Panel - Cancer Center Only    Standing Status:   Future    Standing Expiration Date:   03/04/2024   All questions were answered. The patient knows to call the clinic with any problems, questions or concerns. No barriers to learning was detected. The total time spent in the appointment was 30 minutes.     Malachy Mood, MD 01/09/2023   Carolin Coy, CMA, am acting as scribe for Malachy Mood, MD.   I have reviewed the above documentation for accuracy and completeness, and I agree with the above.

## 2023-01-10 ENCOUNTER — Other Ambulatory Visit: Payer: Self-pay

## 2023-01-10 ENCOUNTER — Inpatient Hospital Stay: Payer: Medicare HMO

## 2023-01-10 VITALS — BP 134/65 | HR 104 | Temp 98.6°F | Resp 16

## 2023-01-10 DIAGNOSIS — C9310 Chronic myelomonocytic leukemia not having achieved remission: Secondary | ICD-10-CM

## 2023-01-10 DIAGNOSIS — Z7952 Long term (current) use of systemic steroids: Secondary | ICD-10-CM | POA: Diagnosis not present

## 2023-01-10 DIAGNOSIS — Z7984 Long term (current) use of oral hypoglycemic drugs: Secondary | ICD-10-CM | POA: Diagnosis not present

## 2023-01-10 DIAGNOSIS — Z5111 Encounter for antineoplastic chemotherapy: Secondary | ICD-10-CM | POA: Diagnosis not present

## 2023-01-10 DIAGNOSIS — Z9884 Bariatric surgery status: Secondary | ICD-10-CM | POA: Diagnosis not present

## 2023-01-10 DIAGNOSIS — Z79899 Other long term (current) drug therapy: Secondary | ICD-10-CM | POA: Diagnosis not present

## 2023-01-10 DIAGNOSIS — R21 Rash and other nonspecific skin eruption: Secondary | ICD-10-CM | POA: Diagnosis not present

## 2023-01-10 DIAGNOSIS — D693 Immune thrombocytopenic purpura: Secondary | ICD-10-CM | POA: Diagnosis not present

## 2023-01-10 MED ORDER — SODIUM CHLORIDE 0.9% FLUSH
10.0000 mL | INTRAVENOUS | Status: DC | PRN
  Administered 2023-01-10: 10 mL

## 2023-01-10 MED ORDER — SODIUM CHLORIDE 0.9 % IV SOLN
75.0000 mg/m2 | Freq: Once | INTRAVENOUS | Status: AC
  Administered 2023-01-10: 130 mg via INTRAVENOUS
  Filled 2023-01-10: qty 13

## 2023-01-10 MED ORDER — SODIUM CHLORIDE 0.9 % IV SOLN
10.0000 mg | Freq: Once | INTRAVENOUS | Status: AC
  Administered 2023-01-10: 10 mg via INTRAVENOUS
  Filled 2023-01-10: qty 10

## 2023-01-10 MED ORDER — SODIUM CHLORIDE 0.9 % IV SOLN: Freq: Once | INTRAVENOUS | Status: AC

## 2023-01-10 MED ORDER — HEPARIN SOD (PORK) LOCK FLUSH 100 UNIT/ML IV SOLN
500.0000 [IU] | Freq: Once | INTRAVENOUS | Status: AC | PRN
  Administered 2023-01-10: 500 [IU]

## 2023-01-10 MED FILL — Dexamethasone Sodium Phosphate Inj 100 MG/10ML: INTRAMUSCULAR | Qty: 1 | Status: AC

## 2023-01-10 NOTE — Progress Notes (Signed)
Per Theodis Blaze ok to treat with HR of 104 today.

## 2023-01-10 NOTE — Patient Instructions (Signed)
[facility]  Discharge Instructions: Thank you for choosing [facility] to provide your oncology and hematology care.   If you have a lab appointment with the Cancer Center, please go directly to the Cancer Center and check in at the registration area.   Wear comfortable clothing and clothing appropriate for easy access to any Portacath or PICC line.   We strive to give you quality time with your provider. You may need to reschedule your appointment if you arrive late (15 or more minutes).  Arriving late affects you and other patients whose appointments are after yours.  Also, if you miss three or more appointments without notifying the office, you may be dismissed from the clinic at the provider's discretion.      For prescription refill requests, have your pharmacy contact our office and allow 72 hours for refills to be completed.    Today you received the following chemotherapy and/or immunotherapy agents: Vidaza     To help prevent nausea and vomiting after your treatment, we encourage you to take your nausea medication as directed.  BELOW ARE SYMPTOMS THAT SHOULD BE REPORTED IMMEDIATELY: *FEVER GREATER THAN 100.4 F (38 C) OR HIGHER *CHILLS OR SWEATING *NAUSEA AND VOMITING THAT IS NOT CONTROLLED WITH YOUR NAUSEA MEDICATION *UNUSUAL SHORTNESS OF BREATH *UNUSUAL BRUISING OR BLEEDING *URINARY PROBLEMS (pain or burning when urinating, or frequent urination) *BOWEL PROBLEMS (unusual diarrhea, constipation, pain near the anus) TENDERNESS IN MOUTH AND THROAT WITH OR WITHOUT PRESENCE OF ULCERS (sore throat, sores in mouth, or a toothache) UNUSUAL RASH, SWELLING OR PAIN  UNUSUAL VAGINAL DISCHARGE OR ITCHING   Items with * indicate a potential emergency and should be followed up as soon as possible or go to the Emergency Department if any problems should occur.  Please show the CHEMOTHERAPY ALERT CARD or IMMUNOTHERAPY ALERT CARD at check-in  to the Emergency Department and triage nurse.  Should you have questions after your visit or need to cancel or reschedule your appointment, please contact [facility]  Dept: [phone]  and follow the prompts.  Office hours are 8:00 a.m. to 4:30 p.m. Monday - Friday. Please note that voicemails left after 4:00 p.m. may not be returned until the following business day.  We are closed weekends and major holidays. You have access to a nurse at all times for urgent questions. Please call the main number to the clinic Dept: [phone] and follow the prompts.   For any non-urgent questions, you may also contact your provider using MyChart. We now offer e-Visits for anyone 18 and older to request care online for non-urgent symptoms. For details visit [email or web address].   Also download the MyChart app! Go to the app store, search "MyChart", open the app, select [place], and log in with your MyChart username and password.  

## 2023-01-11 ENCOUNTER — Inpatient Hospital Stay: Payer: Medicare HMO

## 2023-01-11 ENCOUNTER — Other Ambulatory Visit: Payer: Self-pay

## 2023-01-11 VITALS — BP 137/66 | HR 96 | Temp 98.6°F | Resp 16

## 2023-01-11 DIAGNOSIS — D696 Thrombocytopenia, unspecified: Secondary | ICD-10-CM

## 2023-01-11 DIAGNOSIS — Z95828 Presence of other vascular implants and grafts: Secondary | ICD-10-CM

## 2023-01-11 DIAGNOSIS — R21 Rash and other nonspecific skin eruption: Secondary | ICD-10-CM | POA: Diagnosis not present

## 2023-01-11 DIAGNOSIS — Z7984 Long term (current) use of oral hypoglycemic drugs: Secondary | ICD-10-CM | POA: Diagnosis not present

## 2023-01-11 DIAGNOSIS — Z5111 Encounter for antineoplastic chemotherapy: Secondary | ICD-10-CM | POA: Diagnosis not present

## 2023-01-11 DIAGNOSIS — C9310 Chronic myelomonocytic leukemia not having achieved remission: Secondary | ICD-10-CM | POA: Diagnosis not present

## 2023-01-11 DIAGNOSIS — Z9884 Bariatric surgery status: Secondary | ICD-10-CM | POA: Diagnosis not present

## 2023-01-11 DIAGNOSIS — Z7952 Long term (current) use of systemic steroids: Secondary | ICD-10-CM | POA: Diagnosis not present

## 2023-01-11 DIAGNOSIS — D693 Immune thrombocytopenic purpura: Secondary | ICD-10-CM | POA: Diagnosis not present

## 2023-01-11 DIAGNOSIS — Z79899 Other long term (current) drug therapy: Secondary | ICD-10-CM | POA: Diagnosis not present

## 2023-01-11 LAB — CBC WITH DIFFERENTIAL/PLATELET
Abs Immature Granulocytes: 0.22 10*3/uL — ABNORMAL HIGH (ref 0.00–0.07)
Basophils Absolute: 0 10*3/uL (ref 0.0–0.1)
Basophils Relative: 0 %
Eosinophils Absolute: 0.1 10*3/uL (ref 0.0–0.5)
Eosinophils Relative: 1 %
HCT: 29.4 % — ABNORMAL LOW (ref 36.0–46.0)
Hemoglobin: 9.9 g/dL — ABNORMAL LOW (ref 12.0–15.0)
Immature Granulocytes: 3 %
Lymphocytes Relative: 8 %
Lymphs Abs: 0.6 10*3/uL — ABNORMAL LOW (ref 0.7–4.0)
MCH: 30.9 pg (ref 26.0–34.0)
MCHC: 33.7 g/dL (ref 30.0–36.0)
MCV: 91.9 fL (ref 80.0–100.0)
Monocytes Absolute: 1.5 10*3/uL — ABNORMAL HIGH (ref 0.1–1.0)
Monocytes Relative: 22 %
Neutro Abs: 4.5 10*3/uL (ref 1.7–7.7)
Neutrophils Relative %: 66 %
Platelets: 123 10*3/uL — ABNORMAL LOW (ref 150–400)
RBC: 3.2 MIL/uL — ABNORMAL LOW (ref 3.87–5.11)
RDW: 19.8 % — ABNORMAL HIGH (ref 11.5–15.5)
WBC: 6.8 10*3/uL (ref 4.0–10.5)
nRBC: 0 % (ref 0.0–0.2)

## 2023-01-11 LAB — BASIC METABOLIC PANEL
Anion gap: 7 (ref 5–15)
BUN: 19 mg/dL (ref 8–23)
CO2: 26 mmol/L (ref 22–32)
Calcium: 8.5 mg/dL — ABNORMAL LOW (ref 8.9–10.3)
Chloride: 109 mmol/L (ref 98–111)
Creatinine, Ser: 0.89 mg/dL (ref 0.44–1.00)
GFR, Estimated: >60 mL/min (ref >60)
Glucose, Bld: 187 mg/dL — ABNORMAL HIGH (ref 70–99)
Potassium: 3.5 mmol/L (ref 3.5–5.1)
Sodium: 142 mmol/L (ref 135–145)

## 2023-01-11 MED ORDER — SODIUM CHLORIDE 0.9 % IV SOLN
75.0000 mg/m2 | Freq: Once | INTRAVENOUS | Status: AC
  Administered 2023-01-11: 130 mg via INTRAVENOUS
  Filled 2023-01-11: qty 13

## 2023-01-11 MED ORDER — SODIUM CHLORIDE 0.9% FLUSH
10.0000 mL | INTRAVENOUS | Status: DC | PRN
  Administered 2023-01-11: 10 mL

## 2023-01-11 MED ORDER — SODIUM CHLORIDE 0.9 % IV SOLN
10.0000 mg | Freq: Once | INTRAVENOUS | Status: AC
  Administered 2023-01-11: 10 mg via INTRAVENOUS
  Filled 2023-01-11: qty 10

## 2023-01-11 MED ORDER — HEPARIN SOD (PORK) LOCK FLUSH 100 UNIT/ML IV SOLN
500.0000 [IU] | Freq: Once | INTRAVENOUS | Status: AC | PRN
  Administered 2023-01-11: 500 [IU]

## 2023-01-11 MED ORDER — ROMIPLOSTIM INJECTION 500 MCG
10.0000 ug/kg | Freq: Once | SUBCUTANEOUS | Status: AC
  Administered 2023-01-11: 700 ug via SUBCUTANEOUS
  Filled 2023-01-11: qty 1

## 2023-01-11 MED ORDER — SODIUM CHLORIDE 0.9 % IV SOLN: Freq: Once | INTRAVENOUS | Status: AC

## 2023-01-11 MED FILL — Dexamethasone Sodium Phosphate Inj 100 MG/10ML: INTRAMUSCULAR | Qty: 1 | Status: AC

## 2023-01-11 NOTE — Addendum Note (Signed)
Addended by: Mickeal Needy on: 01/11/2023 07:56 AM   Modules accepted: Orders

## 2023-01-11 NOTE — Patient Instructions (Signed)
[facility]  Discharge Instructions: Thank you for choosing [facility] to provide your oncology and hematology care.   If you have a lab appointment with the Cancer Center, please go directly to the Cancer Center and check in at the registration area.   Wear comfortable clothing and clothing appropriate for easy access to any Portacath or PICC line.   We strive to give you quality time with your provider. You may need to reschedule your appointment if you arrive late (15 or more minutes).  Arriving late affects you and other patients whose appointments are after yours.  Also, if you miss three or more appointments without notifying the office, you may be dismissed from the clinic at the provider's discretion.      For prescription refill requests, have your pharmacy contact our office and allow 72 hours for refills to be completed.    Today you received the following chemotherapy and/or immunotherapy agents: Vidaza.      To help prevent nausea and vomiting after your treatment, we encourage you to take your nausea medication as directed.  BELOW ARE SYMPTOMS THAT SHOULD BE REPORTED IMMEDIATELY: *FEVER GREATER THAN 100.4 F (38 C) OR HIGHER *CHILLS OR SWEATING *NAUSEA AND VOMITING THAT IS NOT CONTROLLED WITH YOUR NAUSEA MEDICATION *UNUSUAL SHORTNESS OF BREATH *UNUSUAL BRUISING OR BLEEDING *URINARY PROBLEMS (pain or burning when urinating, or frequent urination) *BOWEL PROBLEMS (unusual diarrhea, constipation, pain near the anus) TENDERNESS IN MOUTH AND THROAT WITH OR WITHOUT PRESENCE OF ULCERS (sore throat, sores in mouth, or a toothache) UNUSUAL RASH, SWELLING OR PAIN  UNUSUAL VAGINAL DISCHARGE OR ITCHING   Items with * indicate a potential emergency and should be followed up as soon as possible or go to the Emergency Department if any problems should occur.  Please show the CHEMOTHERAPY ALERT CARD or IMMUNOTHERAPY ALERT CARD at  check-in to the Emergency Department and triage nurse.  Should you have questions after your visit or need to cancel or reschedule your appointment, please contact [facility]  Dept: [phone]  and follow the prompts.  Office hours are 8:00 a.m. to 4:30 p.m. Monday - Friday. Please note that voicemails left after 4:00 p.m. may not be returned until the following business day.  We are closed weekends and major holidays. You have access to a nurse at all times for urgent questions. Please call the main number to the clinic Dept: [phone] and follow the prompts.   For any non-urgent questions, you may also contact your provider using MyChart. We now offer e-Visits for anyone 55 and older to request care online for non-urgent symptoms. For details visit [email or web address].   Also download the MyChart app! Go to the app store, search "MyChart", open the app, select [place], and log in with your MyChart username and password.[facility]  Discharge Instructions: Thank you for choosing  Cancer Center to provide your oncology and hematology care.   If you have a lab appointment with the Cancer Center, please go directly to the Cancer Center and check in at the registration area.   Wear comfortable clothing and clothing appropriate for easy access to any Portacath or PICC line.   We strive to give you quality time with your provider. You may need to reschedule your appointment if you arrive late (15 or more minutes).  Arriving late affects you and other patients whose appointments are after  yours.  Also, if you miss three or more appointments without notifying the office, you may be dismissed from the clinic at the provider's discretion.      For prescription refill requests, have your pharmacy contact our office and allow 72 hours for refills to be completed.    Today you received the following chemotherapy  and/or immunotherapy agents: Vidaza.      To help prevent nausea and vomiting after your treatment, we encourage you to take your nausea medication as directed.  BELOW ARE SYMPTOMS THAT SHOULD BE REPORTED IMMEDIATELY: *FEVER GREATER THAN 100.4 F (38 C) OR HIGHER *CHILLS OR SWEATING *NAUSEA AND VOMITING THAT IS NOT CONTROLLED WITH YOUR NAUSEA MEDICATION *UNUSUAL SHORTNESS OF BREATH *UNUSUAL BRUISING OR BLEEDING *URINARY PROBLEMS (pain or burning when urinating, or frequent urination) *BOWEL PROBLEMS (unusual diarrhea, constipation, pain near the anus) TENDERNESS IN MOUTH AND THROAT WITH OR WITHOUT PRESENCE OF ULCERS (sore throat, sores in mouth, or a toothache) UNUSUAL RASH, SWELLING OR PAIN  UNUSUAL VAGINAL DISCHARGE OR ITCHING   Items with * indicate a potential emergency and should be followed up as soon as possible or go to the Emergency Department if any problems should occur.  Please show the CHEMOTHERAPY ALERT CARD or IMMUNOTHERAPY ALERT CARD at check-in to the Emergency Department and triage nurse.  Should you have questions after your visit or need to cancel or reschedule your appointment, please contact [facility]  Dept: [phone]  and follow the prompts.  Office hours are 8:00 a.m. to 4:30 p.m. Monday - Friday. Please note that voicemails left after 4:00 p.m. may not be returned until the following business day.  We are closed weekends and major holidays. You have access to a nurse at all times for urgent questions. Please call the main number to the clinic Dept: [phone] and follow the prompts.   For any non-urgent questions, you may also contact your provider using MyChart. We now offer e-Visits for anyone 35 and older to request care online for non-urgent symptoms. For details visit [email or web address].   Also download the MyChart app! Go to the app store, search "MyChart", open the app, select [place], and log in with your  MyChart username and password.

## 2023-01-12 ENCOUNTER — Inpatient Hospital Stay: Payer: Medicare HMO | Attending: Hematology

## 2023-01-12 ENCOUNTER — Other Ambulatory Visit: Payer: Self-pay

## 2023-01-12 VITALS — BP 127/60 | HR 90 | Temp 97.6°F | Resp 20

## 2023-01-12 DIAGNOSIS — C9310 Chronic myelomonocytic leukemia not having achieved remission: Secondary | ICD-10-CM | POA: Insufficient documentation

## 2023-01-12 DIAGNOSIS — Z5111 Encounter for antineoplastic chemotherapy: Secondary | ICD-10-CM | POA: Insufficient documentation

## 2023-01-12 DIAGNOSIS — D693 Immune thrombocytopenic purpura: Secondary | ICD-10-CM | POA: Insufficient documentation

## 2023-01-12 MED ORDER — HEPARIN SOD (PORK) LOCK FLUSH 100 UNIT/ML IV SOLN
500.0000 [IU] | Freq: Once | INTRAVENOUS | Status: AC | PRN
  Administered 2023-01-12: 500 [IU]

## 2023-01-12 MED ORDER — SODIUM CHLORIDE 0.9 % IV SOLN
10.0000 mg | Freq: Once | INTRAVENOUS | Status: AC
  Administered 2023-01-12: 10 mg via INTRAVENOUS
  Filled 2023-01-12: qty 10

## 2023-01-12 MED ORDER — SODIUM CHLORIDE 0.9 % IV SOLN
75.0000 mg/m2 | Freq: Once | INTRAVENOUS | Status: AC
  Administered 2023-01-12: 130 mg via INTRAVENOUS
  Filled 2023-01-12: qty 13

## 2023-01-12 MED ORDER — SODIUM CHLORIDE 0.9% FLUSH
10.0000 mL | INTRAVENOUS | Status: DC | PRN
  Administered 2023-01-12: 10 mL

## 2023-01-12 MED ORDER — SODIUM CHLORIDE 0.9 % IV SOLN: Freq: Once | INTRAVENOUS | Status: AC

## 2023-01-12 NOTE — Patient Instructions (Signed)
[facility]  Discharge Instructions: Thank you for choosing [facility] to provide your oncology and hematology care.   If you have a lab appointment with the Cancer Center, please go directly to the Cancer Center and check in at the registration area.   Wear comfortable clothing and clothing appropriate for easy access to any Portacath or PICC line.   We strive to give you quality time with your provider. You may need to reschedule your appointment if you arrive late (15 or more minutes).  Arriving late affects you and other patients whose appointments are after yours.  Also, if you miss three or more appointments without notifying the office, you may be dismissed from the clinic at the provider's discretion.      For prescription refill requests, have your pharmacy contact our office and allow 72 hours for refills to be completed.    Today you received the following chemotherapy and/or immunotherapy agents: Vidaza.      To help prevent nausea and vomiting after your treatment, we encourage you to take your nausea medication as directed.  BELOW ARE SYMPTOMS THAT SHOULD BE REPORTED IMMEDIATELY: *FEVER GREATER THAN 100.4 F (38 C) OR HIGHER *CHILLS OR SWEATING *NAUSEA AND VOMITING THAT IS NOT CONTROLLED WITH YOUR NAUSEA MEDICATION *UNUSUAL SHORTNESS OF BREATH *UNUSUAL BRUISING OR BLEEDING *URINARY PROBLEMS (pain or burning when urinating, or frequent urination) *BOWEL PROBLEMS (unusual diarrhea, constipation, pain near the anus) TENDERNESS IN MOUTH AND THROAT WITH OR WITHOUT PRESENCE OF ULCERS (sore throat, sores in mouth, or a toothache) UNUSUAL RASH, SWELLING OR PAIN  UNUSUAL VAGINAL DISCHARGE OR ITCHING   Items with * indicate a potential emergency and should be followed up as soon as possible or go to the Emergency Department if any problems should occur.  Please show the CHEMOTHERAPY ALERT CARD or IMMUNOTHERAPY ALERT CARD at  check-in to the Emergency Department and triage nurse.  Should you have questions after your visit or need to cancel or reschedule your appointment, please contact [facility]  Dept: [phone]  and follow the prompts.  Office hours are 8:00 a.m. to 4:30 p.m. Monday - Friday. Please note that voicemails left after 4:00 p.m. may not be returned until the following business day.  We are closed weekends and major holidays. You have access to a nurse at all times for urgent questions. Please call the main number to the clinic Dept: [phone] and follow the prompts.   For any non-urgent questions, you may also contact your provider using MyChart. We now offer e-Visits for anyone 55 and older to request care online for non-urgent symptoms. For details visit [email or web address].   Also download the MyChart app! Go to the app store, search "MyChart", open the app, select [place], and log in with your MyChart username and password.[facility]  Discharge Instructions: Thank you for choosing  Cancer Center to provide your oncology and hematology care.   If you have a lab appointment with the Cancer Center, please go directly to the Cancer Center and check in at the registration area.   Wear comfortable clothing and clothing appropriate for easy access to any Portacath or PICC line.   We strive to give you quality time with your provider. You may need to reschedule your appointment if you arrive late (15 or more minutes).  Arriving late affects you and other patients whose appointments are after  yours.  Also, if you miss three or more appointments without notifying the office, you may be dismissed from the clinic at the provider's discretion.      For prescription refill requests, have your pharmacy contact our office and allow 72 hours for refills to be completed.    Today you received the following chemotherapy  and/or immunotherapy agents: Vidaza.      To help prevent nausea and vomiting after your treatment, we encourage you to take your nausea medication as directed.  BELOW ARE SYMPTOMS THAT SHOULD BE REPORTED IMMEDIATELY: *FEVER GREATER THAN 100.4 F (38 C) OR HIGHER *CHILLS OR SWEATING *NAUSEA AND VOMITING THAT IS NOT CONTROLLED WITH YOUR NAUSEA MEDICATION *UNUSUAL SHORTNESS OF BREATH *UNUSUAL BRUISING OR BLEEDING *URINARY PROBLEMS (pain or burning when urinating, or frequent urination) *BOWEL PROBLEMS (unusual diarrhea, constipation, pain near the anus) TENDERNESS IN MOUTH AND THROAT WITH OR WITHOUT PRESENCE OF ULCERS (sore throat, sores in mouth, or a toothache) UNUSUAL RASH, SWELLING OR PAIN  UNUSUAL VAGINAL DISCHARGE OR ITCHING   Items with * indicate a potential emergency and should be followed up as soon as possible or go to the Emergency Department if any problems should occur.  Please show the CHEMOTHERAPY ALERT CARD or IMMUNOTHERAPY ALERT CARD at check-in to the Emergency Department and triage nurse.  Should you have questions after your visit or need to cancel or reschedule your appointment, please contact [facility]  Dept: [phone]  and follow the prompts.  Office hours are 8:00 a.m. to 4:30 p.m. Monday - Friday. Please note that voicemails left after 4:00 p.m. may not be returned until the following business day.  We are closed weekends and major holidays. You have access to a nurse at all times for urgent questions. Please call the main number to the clinic Dept: [phone] and follow the prompts.   For any non-urgent questions, you may also contact your provider using MyChart. We now offer e-Visits for anyone 35 and older to request care online for non-urgent symptoms. For details visit [email or web address].   Also download the MyChart app! Go to the app store, search "MyChart", open the app, select [place], and log in with your  MyChart username and password.

## 2023-01-13 ENCOUNTER — Inpatient Hospital Stay: Payer: Medicare HMO

## 2023-01-13 VITALS — BP 132/65 | HR 86 | Temp 97.8°F | Resp 16

## 2023-01-13 DIAGNOSIS — C9310 Chronic myelomonocytic leukemia not having achieved remission: Secondary | ICD-10-CM | POA: Diagnosis not present

## 2023-01-13 DIAGNOSIS — Z5111 Encounter for antineoplastic chemotherapy: Secondary | ICD-10-CM | POA: Diagnosis not present

## 2023-01-13 DIAGNOSIS — D693 Immune thrombocytopenic purpura: Secondary | ICD-10-CM | POA: Diagnosis not present

## 2023-01-13 MED ORDER — SODIUM CHLORIDE 0.9 % IV SOLN
10.0000 mg | Freq: Once | INTRAVENOUS | Status: DC
  Filled 2023-01-13: qty 1

## 2023-01-13 MED ORDER — SODIUM CHLORIDE 0.9% FLUSH
10.0000 mL | INTRAVENOUS | Status: DC | PRN
  Administered 2023-01-13: 10 mL

## 2023-01-13 MED ORDER — SODIUM CHLORIDE 0.9 % IV SOLN
75.0000 mg/m2 | Freq: Once | INTRAVENOUS | Status: AC
  Administered 2023-01-13: 130 mg via INTRAVENOUS
  Filled 2023-01-13: qty 13

## 2023-01-13 MED ORDER — SODIUM CHLORIDE 0.9 % IV SOLN: Freq: Once | INTRAVENOUS | Status: AC

## 2023-01-13 MED ORDER — HEPARIN SOD (PORK) LOCK FLUSH 100 UNIT/ML IV SOLN
500.0000 [IU] | Freq: Once | INTRAVENOUS | Status: AC | PRN
  Administered 2023-01-13: 500 [IU]

## 2023-01-13 MED ORDER — SODIUM CHLORIDE 0.9 % IV SOLN
10.0000 mg | Freq: Once | INTRAVENOUS | Status: AC
  Administered 2023-01-13: 10 mg via INTRAVENOUS
  Filled 2023-01-13: qty 10

## 2023-01-13 NOTE — Patient Instructions (Signed)
[place] [facility]  Discharge Instructions: Thank you for choosing [facility] to provide your oncology and hematology care.   If you have a lab appointment with the Cancer Center, please go directly to the Cancer Center and check in at the registration area.   Wear comfortable clothing and clothing appropriate for easy access to any Portacath or PICC line.   We strive to give you quality time with your provider. You may need to reschedule your appointment if you arrive late (15 or more minutes).  Arriving late affects you and other patients whose appointments are after yours.  Also, if you miss three or more appointments without notifying the office, you may be dismissed from the clinic at the provider's discretion.      For prescription refill requests, have your pharmacy contact our office and allow 72 hours for refills to be completed.    Today you received the following chemotherapy and/or immunotherapy agents :  Azacitidine.   To help prevent nausea and vomiting after your treatment, we encourage you to take your nausea medication as directed.  BELOW ARE SYMPTOMS THAT SHOULD BE REPORTED IMMEDIATELY: *FEVER GREATER THAN 100.4 F (38 C) OR HIGHER *CHILLS OR SWEATING *NAUSEA AND VOMITING THAT IS NOT CONTROLLED WITH YOUR NAUSEA MEDICATION *UNUSUAL SHORTNESS OF BREATH *UNUSUAL BRUISING OR BLEEDING *URINARY PROBLEMS (pain or burning when urinating, or frequent urination) *BOWEL PROBLEMS (unusual diarrhea, constipation, pain near the anus) TENDERNESS IN MOUTH AND THROAT WITH OR WITHOUT PRESENCE OF ULCERS (sore throat, sores in mouth, or a toothache) UNUSUAL RASH, SWELLING OR PAIN  UNUSUAL VAGINAL DISCHARGE OR ITCHING   Items with * indicate a potential emergency and should be followed up as soon as possible or go to the Emergency Department if any problems should occur.  Please show the CHEMOTHERAPY ALERT CARD or IMMUNOTHERAPY ALERT CARD at  check-in to the Emergency Department and triage nurse.  Should you have questions after your visit or need to cancel or reschedule your appointment, please contact [facility] [facility]  Dept: [phone]  and follow the prompts.  Office hours are 8:00 a.m. to 4:30 p.m. Monday - Friday. Please note that voicemails left after 4:00 p.m. may not be returned until the following business day.  We are closed weekends and major holidays. You have access to a nurse at all times for urgent questions. Please call the main number to the clinic Dept: [phone] and follow the prompts.   For any non-urgent questions, you may also contact your provider using MyChart. We now offer e-Visits for anyone 24 and older to request care online for non-urgent symptoms. For details visit [email or web address].   Also download the MyChart app! Go to the app store, search "MyChart", open the app, select [place], and log in with your MyChart username and password.

## 2023-01-14 DIAGNOSIS — R102 Pelvic and perineal pain: Secondary | ICD-10-CM | POA: Diagnosis not present

## 2023-01-14 DIAGNOSIS — S199XXA Unspecified injury of neck, initial encounter: Secondary | ICD-10-CM | POA: Diagnosis not present

## 2023-01-14 DIAGNOSIS — W19XXXA Unspecified fall, initial encounter: Secondary | ICD-10-CM | POA: Diagnosis not present

## 2023-01-14 DIAGNOSIS — G8911 Acute pain due to trauma: Secondary | ICD-10-CM | POA: Diagnosis not present

## 2023-01-14 DIAGNOSIS — S3219XA Other fracture of sacrum, initial encounter for closed fracture: Secondary | ICD-10-CM | POA: Diagnosis not present

## 2023-01-14 DIAGNOSIS — C9210 Chronic myeloid leukemia, BCR/ABL-positive, not having achieved remission: Secondary | ICD-10-CM | POA: Diagnosis not present

## 2023-01-14 DIAGNOSIS — S0990XA Unspecified injury of head, initial encounter: Secondary | ICD-10-CM | POA: Diagnosis not present

## 2023-01-14 DIAGNOSIS — I959 Hypotension, unspecified: Secondary | ICD-10-CM | POA: Diagnosis not present

## 2023-01-14 DIAGNOSIS — R079 Chest pain, unspecified: Secondary | ICD-10-CM | POA: Diagnosis not present

## 2023-01-14 DIAGNOSIS — S2239XA Fracture of one rib, unspecified side, initial encounter for closed fracture: Secondary | ICD-10-CM | POA: Diagnosis not present

## 2023-01-14 DIAGNOSIS — S22080A Wedge compression fracture of T11-T12 vertebra, initial encounter for closed fracture: Secondary | ICD-10-CM | POA: Diagnosis not present

## 2023-01-14 DIAGNOSIS — S2242XA Multiple fractures of ribs, left side, initial encounter for closed fracture: Secondary | ICD-10-CM | POA: Diagnosis not present

## 2023-01-14 DIAGNOSIS — K7460 Unspecified cirrhosis of liver: Secondary | ICD-10-CM | POA: Diagnosis not present

## 2023-01-14 DIAGNOSIS — S32010A Wedge compression fracture of first lumbar vertebra, initial encounter for closed fracture: Secondary | ICD-10-CM | POA: Diagnosis not present

## 2023-01-14 DIAGNOSIS — W1789XA Other fall from one level to another, initial encounter: Secondary | ICD-10-CM | POA: Diagnosis not present

## 2023-01-14 DIAGNOSIS — J90 Pleural effusion, not elsewhere classified: Secondary | ICD-10-CM | POA: Diagnosis not present

## 2023-01-14 DIAGNOSIS — R0781 Pleurodynia: Secondary | ICD-10-CM | POA: Diagnosis not present

## 2023-01-14 DIAGNOSIS — M542 Cervicalgia: Secondary | ICD-10-CM | POA: Diagnosis not present

## 2023-01-14 DIAGNOSIS — Z7401 Bed confinement status: Secondary | ICD-10-CM | POA: Diagnosis not present

## 2023-01-14 DIAGNOSIS — S3210XA Unspecified fracture of sacrum, initial encounter for closed fracture: Secondary | ICD-10-CM | POA: Diagnosis not present

## 2023-01-14 DIAGNOSIS — M549 Dorsalgia, unspecified: Secondary | ICD-10-CM | POA: Diagnosis not present

## 2023-01-14 DIAGNOSIS — S3992XA Unspecified injury of lower back, initial encounter: Secondary | ICD-10-CM | POA: Diagnosis not present

## 2023-01-14 DIAGNOSIS — R296 Repeated falls: Secondary | ICD-10-CM | POA: Diagnosis not present

## 2023-01-14 DIAGNOSIS — S36029A Unspecified contusion of spleen, initial encounter: Secondary | ICD-10-CM | POA: Diagnosis not present

## 2023-01-14 DIAGNOSIS — R519 Headache, unspecified: Secondary | ICD-10-CM | POA: Diagnosis not present

## 2023-01-14 DIAGNOSIS — R000 Tachycardia, unspecified: Secondary | ICD-10-CM | POA: Diagnosis not present

## 2023-01-14 DIAGNOSIS — S2241XA Multiple fractures of ribs, right side, initial encounter for closed fracture: Secondary | ICD-10-CM | POA: Diagnosis not present

## 2023-01-14 DIAGNOSIS — R0789 Other chest pain: Secondary | ICD-10-CM | POA: Diagnosis not present

## 2023-01-14 DIAGNOSIS — T1490XA Injury, unspecified, initial encounter: Secondary | ICD-10-CM | POA: Diagnosis not present

## 2023-01-14 DIAGNOSIS — S3991XA Unspecified injury of abdomen, initial encounter: Secondary | ICD-10-CM | POA: Diagnosis not present

## 2023-01-14 DIAGNOSIS — S32591A Other specified fracture of right pubis, initial encounter for closed fracture: Secondary | ICD-10-CM | POA: Diagnosis not present

## 2023-01-14 DIAGNOSIS — Z043 Encounter for examination and observation following other accident: Secondary | ICD-10-CM | POA: Diagnosis not present

## 2023-01-14 DIAGNOSIS — R279 Unspecified lack of coordination: Secondary | ICD-10-CM | POA: Diagnosis not present

## 2023-01-14 DIAGNOSIS — S22070A Wedge compression fracture of T9-T10 vertebra, initial encounter for closed fracture: Secondary | ICD-10-CM | POA: Diagnosis not present

## 2023-01-14 DIAGNOSIS — S32030A Wedge compression fracture of third lumbar vertebra, initial encounter for closed fracture: Secondary | ICD-10-CM | POA: Diagnosis not present

## 2023-01-14 DIAGNOSIS — R109 Unspecified abdominal pain: Secondary | ICD-10-CM | POA: Diagnosis not present

## 2023-01-14 DIAGNOSIS — S32020A Wedge compression fracture of second lumbar vertebra, initial encounter for closed fracture: Secondary | ICD-10-CM | POA: Diagnosis not present

## 2023-01-14 DIAGNOSIS — K429 Umbilical hernia without obstruction or gangrene: Secondary | ICD-10-CM | POA: Diagnosis not present

## 2023-01-14 DIAGNOSIS — S3993XA Unspecified injury of pelvis, initial encounter: Secondary | ICD-10-CM | POA: Diagnosis not present

## 2023-01-15 DIAGNOSIS — T1490XA Injury, unspecified, initial encounter: Secondary | ICD-10-CM | POA: Diagnosis not present

## 2023-01-15 DIAGNOSIS — S2242XA Multiple fractures of ribs, left side, initial encounter for closed fracture: Secondary | ICD-10-CM | POA: Diagnosis not present

## 2023-01-15 DIAGNOSIS — Z794 Long term (current) use of insulin: Secondary | ICD-10-CM | POA: Diagnosis not present

## 2023-01-15 DIAGNOSIS — R161 Splenomegaly, not elsewhere classified: Secondary | ICD-10-CM | POA: Diagnosis not present

## 2023-01-15 DIAGNOSIS — K7460 Unspecified cirrhosis of liver: Secondary | ICD-10-CM | POA: Diagnosis not present

## 2023-01-15 DIAGNOSIS — N2889 Other specified disorders of kidney and ureter: Secondary | ICD-10-CM | POA: Diagnosis not present

## 2023-01-15 DIAGNOSIS — M545 Low back pain, unspecified: Secondary | ICD-10-CM | POA: Diagnosis not present

## 2023-01-15 DIAGNOSIS — E1142 Type 2 diabetes mellitus with diabetic polyneuropathy: Secondary | ICD-10-CM | POA: Diagnosis not present

## 2023-01-15 DIAGNOSIS — C9310 Chronic myelomonocytic leukemia not having achieved remission: Secondary | ICD-10-CM | POA: Diagnosis not present

## 2023-01-15 DIAGNOSIS — S32591A Other specified fracture of right pubis, initial encounter for closed fracture: Secondary | ICD-10-CM | POA: Diagnosis not present

## 2023-01-15 DIAGNOSIS — S0990XA Unspecified injury of head, initial encounter: Secondary | ICD-10-CM | POA: Diagnosis not present

## 2023-01-15 DIAGNOSIS — J811 Chronic pulmonary edema: Secondary | ICD-10-CM | POA: Diagnosis not present

## 2023-01-15 DIAGNOSIS — K766 Portal hypertension: Secondary | ICD-10-CM | POA: Diagnosis not present

## 2023-01-15 DIAGNOSIS — K5909 Other constipation: Secondary | ICD-10-CM | POA: Diagnosis not present

## 2023-01-15 DIAGNOSIS — G2581 Restless legs syndrome: Secondary | ICD-10-CM | POA: Diagnosis not present

## 2023-01-15 DIAGNOSIS — W109XXA Fall (on) (from) unspecified stairs and steps, initial encounter: Secondary | ICD-10-CM | POA: Diagnosis not present

## 2023-01-15 DIAGNOSIS — R011 Cardiac murmur, unspecified: Secondary | ICD-10-CM | POA: Diagnosis not present

## 2023-01-15 DIAGNOSIS — K5900 Constipation, unspecified: Secondary | ICD-10-CM | POA: Diagnosis not present

## 2023-01-15 DIAGNOSIS — D696 Thrombocytopenia, unspecified: Secondary | ICD-10-CM | POA: Diagnosis not present

## 2023-01-15 DIAGNOSIS — F419 Anxiety disorder, unspecified: Secondary | ICD-10-CM | POA: Diagnosis not present

## 2023-01-15 DIAGNOSIS — R931 Abnormal findings on diagnostic imaging of heart and coronary circulation: Secondary | ICD-10-CM | POA: Diagnosis not present

## 2023-01-15 DIAGNOSIS — S36029A Unspecified contusion of spleen, initial encounter: Secondary | ICD-10-CM | POA: Diagnosis not present

## 2023-01-15 DIAGNOSIS — K8020 Calculus of gallbladder without cholecystitis without obstruction: Secondary | ICD-10-CM | POA: Diagnosis not present

## 2023-01-15 DIAGNOSIS — W19XXXA Unspecified fall, initial encounter: Secondary | ICD-10-CM | POA: Diagnosis not present

## 2023-01-15 DIAGNOSIS — R932 Abnormal findings on diagnostic imaging of liver and biliary tract: Secondary | ICD-10-CM | POA: Diagnosis not present

## 2023-01-15 DIAGNOSIS — D693 Immune thrombocytopenic purpura: Secondary | ICD-10-CM | POA: Diagnosis not present

## 2023-01-15 DIAGNOSIS — C9210 Chronic myeloid leukemia, BCR/ABL-positive, not having achieved remission: Secondary | ICD-10-CM | POA: Diagnosis not present

## 2023-01-15 DIAGNOSIS — R079 Chest pain, unspecified: Secondary | ICD-10-CM | POA: Diagnosis not present

## 2023-01-15 DIAGNOSIS — W19XXXS Unspecified fall, sequela: Secondary | ICD-10-CM | POA: Diagnosis not present

## 2023-01-15 DIAGNOSIS — S2243XA Multiple fractures of ribs, bilateral, initial encounter for closed fracture: Secondary | ICD-10-CM | POA: Diagnosis not present

## 2023-01-15 DIAGNOSIS — R162 Hepatomegaly with splenomegaly, not elsewhere classified: Secondary | ICD-10-CM | POA: Diagnosis not present

## 2023-01-15 DIAGNOSIS — S3991XA Unspecified injury of abdomen, initial encounter: Secondary | ICD-10-CM | POA: Diagnosis not present

## 2023-01-15 DIAGNOSIS — S3219XA Other fracture of sacrum, initial encounter for closed fracture: Secondary | ICD-10-CM | POA: Diagnosis not present

## 2023-01-15 DIAGNOSIS — K5903 Drug induced constipation: Secondary | ICD-10-CM | POA: Diagnosis not present

## 2023-01-15 DIAGNOSIS — G8911 Acute pain due to trauma: Secondary | ICD-10-CM | POA: Diagnosis not present

## 2023-01-15 DIAGNOSIS — S199XXA Unspecified injury of neck, initial encounter: Secondary | ICD-10-CM | POA: Diagnosis not present

## 2023-01-15 DIAGNOSIS — E119 Type 2 diabetes mellitus without complications: Secondary | ICD-10-CM | POA: Diagnosis not present

## 2023-01-15 DIAGNOSIS — Y92009 Unspecified place in unspecified non-institutional (private) residence as the place of occurrence of the external cause: Secondary | ICD-10-CM | POA: Diagnosis not present

## 2023-01-15 DIAGNOSIS — D735 Infarction of spleen: Secondary | ICD-10-CM | POA: Diagnosis not present

## 2023-01-15 DIAGNOSIS — S22088A Other fracture of T11-T12 vertebra, initial encounter for closed fracture: Secondary | ICD-10-CM | POA: Diagnosis not present

## 2023-01-15 DIAGNOSIS — S3993XA Unspecified injury of pelvis, initial encounter: Secondary | ICD-10-CM | POA: Diagnosis not present

## 2023-01-15 DIAGNOSIS — Z66 Do not resuscitate: Secondary | ICD-10-CM | POA: Diagnosis not present

## 2023-01-15 DIAGNOSIS — S2243XD Multiple fractures of ribs, bilateral, subsequent encounter for fracture with routine healing: Secondary | ICD-10-CM | POA: Diagnosis not present

## 2023-01-15 DIAGNOSIS — R0781 Pleurodynia: Secondary | ICD-10-CM | POA: Diagnosis not present

## 2023-01-15 DIAGNOSIS — M4854XA Collapsed vertebra, not elsewhere classified, thoracic region, initial encounter for fracture: Secondary | ICD-10-CM | POA: Diagnosis not present

## 2023-01-15 DIAGNOSIS — J90 Pleural effusion, not elsewhere classified: Secondary | ICD-10-CM | POA: Diagnosis not present

## 2023-01-15 DIAGNOSIS — R188 Other ascites: Secondary | ICD-10-CM | POA: Diagnosis not present

## 2023-01-15 DIAGNOSIS — M25571 Pain in right ankle and joints of right foot: Secondary | ICD-10-CM | POA: Diagnosis not present

## 2023-01-15 DIAGNOSIS — M4856XA Collapsed vertebra, not elsewhere classified, lumbar region, initial encounter for fracture: Secondary | ICD-10-CM | POA: Diagnosis not present

## 2023-01-15 DIAGNOSIS — S2241XA Multiple fractures of ribs, right side, initial encounter for closed fracture: Secondary | ICD-10-CM | POA: Diagnosis not present

## 2023-01-15 DIAGNOSIS — I083 Combined rheumatic disorders of mitral, aortic and tricuspid valves: Secondary | ICD-10-CM | POA: Diagnosis not present

## 2023-01-15 DIAGNOSIS — J810 Acute pulmonary edema: Secondary | ICD-10-CM | POA: Diagnosis not present

## 2023-01-15 DIAGNOSIS — I10 Essential (primary) hypertension: Secondary | ICD-10-CM | POA: Diagnosis not present

## 2023-01-15 DIAGNOSIS — S32511A Fracture of superior rim of right pubis, initial encounter for closed fracture: Secondary | ICD-10-CM | POA: Diagnosis not present

## 2023-01-15 DIAGNOSIS — S32040A Wedge compression fracture of fourth lumbar vertebra, initial encounter for closed fracture: Secondary | ICD-10-CM | POA: Diagnosis not present

## 2023-01-15 DIAGNOSIS — S22080A Wedge compression fracture of T11-T12 vertebra, initial encounter for closed fracture: Secondary | ICD-10-CM | POA: Diagnosis not present

## 2023-01-15 DIAGNOSIS — M79671 Pain in right foot: Secondary | ICD-10-CM | POA: Diagnosis not present

## 2023-01-15 DIAGNOSIS — M7989 Other specified soft tissue disorders: Secondary | ICD-10-CM | POA: Diagnosis not present

## 2023-01-15 DIAGNOSIS — M4855XA Collapsed vertebra, not elsewhere classified, thoracolumbar region, initial encounter for fracture: Secondary | ICD-10-CM | POA: Diagnosis not present

## 2023-01-15 DIAGNOSIS — S3210XA Unspecified fracture of sacrum, initial encounter for closed fracture: Secondary | ICD-10-CM | POA: Diagnosis not present

## 2023-01-15 DIAGNOSIS — E1140 Type 2 diabetes mellitus with diabetic neuropathy, unspecified: Secondary | ICD-10-CM | POA: Diagnosis not present

## 2023-01-15 DIAGNOSIS — G8929 Other chronic pain: Secondary | ICD-10-CM | POA: Diagnosis not present

## 2023-01-15 DIAGNOSIS — S22089A Unspecified fracture of T11-T12 vertebra, initial encounter for closed fracture: Secondary | ICD-10-CM | POA: Diagnosis not present

## 2023-01-15 DIAGNOSIS — R296 Repeated falls: Secondary | ICD-10-CM | POA: Diagnosis not present

## 2023-01-15 DIAGNOSIS — F32A Depression, unspecified: Secondary | ICD-10-CM | POA: Diagnosis not present

## 2023-01-15 DIAGNOSIS — T402X5A Adverse effect of other opioids, initial encounter: Secondary | ICD-10-CM | POA: Diagnosis not present

## 2023-01-18 ENCOUNTER — Ambulatory Visit (HOSPITAL_COMMUNITY): Admission: RE | Admit: 2023-01-18 | Payer: Medicare HMO | Source: Ambulatory Visit

## 2023-01-18 ENCOUNTER — Inpatient Hospital Stay: Payer: Medicare HMO

## 2023-01-20 MED FILL — Romiplostim For Inj 500 MCG: SUBCUTANEOUS | Qty: 1 | Status: AC

## 2023-01-20 MED FILL — Romiplostim For Inj 125 MCG: SUBCUTANEOUS | Qty: 0.6 | Status: AC

## 2023-01-21 DIAGNOSIS — D72829 Elevated white blood cell count, unspecified: Secondary | ICD-10-CM | POA: Diagnosis not present

## 2023-01-21 DIAGNOSIS — E612 Magnesium deficiency: Secondary | ICD-10-CM | POA: Diagnosis not present

## 2023-01-21 DIAGNOSIS — M545 Low back pain, unspecified: Secondary | ICD-10-CM | POA: Diagnosis not present

## 2023-01-21 DIAGNOSIS — S32519D Fracture of superior rim of unspecified pubis, subsequent encounter for fracture with routine healing: Secondary | ICD-10-CM | POA: Diagnosis not present

## 2023-01-21 DIAGNOSIS — S2241XA Multiple fractures of ribs, right side, initial encounter for closed fracture: Secondary | ICD-10-CM | POA: Diagnosis not present

## 2023-01-21 DIAGNOSIS — S2243XA Multiple fractures of ribs, bilateral, initial encounter for closed fracture: Secondary | ICD-10-CM | POA: Diagnosis not present

## 2023-01-21 DIAGNOSIS — W19XXXD Unspecified fall, subsequent encounter: Secondary | ICD-10-CM | POA: Diagnosis not present

## 2023-01-21 DIAGNOSIS — E119 Type 2 diabetes mellitus without complications: Secondary | ICD-10-CM | POA: Diagnosis not present

## 2023-01-21 DIAGNOSIS — E568 Deficiency of other vitamins: Secondary | ICD-10-CM | POA: Diagnosis not present

## 2023-01-21 DIAGNOSIS — S32511A Fracture of superior rim of right pubis, initial encounter for closed fracture: Secondary | ICD-10-CM | POA: Diagnosis not present

## 2023-01-21 DIAGNOSIS — S3219XA Other fracture of sacrum, initial encounter for closed fracture: Secondary | ICD-10-CM | POA: Diagnosis not present

## 2023-01-21 DIAGNOSIS — Z7409 Other reduced mobility: Secondary | ICD-10-CM | POA: Diagnosis not present

## 2023-01-21 DIAGNOSIS — F339 Major depressive disorder, recurrent, unspecified: Secondary | ICD-10-CM | POA: Diagnosis not present

## 2023-01-21 DIAGNOSIS — R278 Other lack of coordination: Secondary | ICD-10-CM | POA: Diagnosis not present

## 2023-01-21 DIAGNOSIS — E1142 Type 2 diabetes mellitus with diabetic polyneuropathy: Secondary | ICD-10-CM | POA: Diagnosis not present

## 2023-01-21 DIAGNOSIS — R161 Splenomegaly, not elsewhere classified: Secondary | ICD-10-CM | POA: Diagnosis not present

## 2023-01-21 DIAGNOSIS — S22089A Unspecified fracture of T11-T12 vertebra, initial encounter for closed fracture: Secondary | ICD-10-CM | POA: Diagnosis not present

## 2023-01-21 DIAGNOSIS — K2100 Gastro-esophageal reflux disease with esophagitis, without bleeding: Secondary | ICD-10-CM | POA: Diagnosis not present

## 2023-01-21 DIAGNOSIS — C9210 Chronic myeloid leukemia, BCR/ABL-positive, not having achieved remission: Secondary | ICD-10-CM | POA: Diagnosis not present

## 2023-01-21 DIAGNOSIS — Z9181 History of falling: Secondary | ICD-10-CM | POA: Diagnosis not present

## 2023-01-21 DIAGNOSIS — K7460 Unspecified cirrhosis of liver: Secondary | ICD-10-CM | POA: Diagnosis not present

## 2023-01-21 DIAGNOSIS — I5020 Unspecified systolic (congestive) heart failure: Secondary | ICD-10-CM | POA: Diagnosis not present

## 2023-01-21 DIAGNOSIS — S50811A Abrasion of right forearm, initial encounter: Secondary | ICD-10-CM | POA: Diagnosis not present

## 2023-01-21 DIAGNOSIS — S22020D Wedge compression fracture of second thoracic vertebra, subsequent encounter for fracture with routine healing: Secondary | ICD-10-CM | POA: Diagnosis not present

## 2023-01-21 DIAGNOSIS — G894 Chronic pain syndrome: Secondary | ICD-10-CM | POA: Diagnosis not present

## 2023-01-21 DIAGNOSIS — L299 Pruritus, unspecified: Secondary | ICD-10-CM | POA: Diagnosis not present

## 2023-01-21 DIAGNOSIS — G8929 Other chronic pain: Secondary | ICD-10-CM | POA: Diagnosis not present

## 2023-01-21 DIAGNOSIS — E1140 Type 2 diabetes mellitus with diabetic neuropathy, unspecified: Secondary | ICD-10-CM | POA: Diagnosis not present

## 2023-01-21 DIAGNOSIS — I5022 Chronic systolic (congestive) heart failure: Secondary | ICD-10-CM | POA: Diagnosis not present

## 2023-01-21 DIAGNOSIS — M6281 Muscle weakness (generalized): Secondary | ICD-10-CM | POA: Diagnosis not present

## 2023-01-21 DIAGNOSIS — D696 Thrombocytopenia, unspecified: Secondary | ICD-10-CM | POA: Diagnosis not present

## 2023-01-21 DIAGNOSIS — R188 Other ascites: Secondary | ICD-10-CM | POA: Diagnosis not present

## 2023-01-21 DIAGNOSIS — D735 Infarction of spleen: Secondary | ICD-10-CM | POA: Diagnosis not present

## 2023-01-21 DIAGNOSIS — F0631 Mood disorder due to known physiological condition with depressive features: Secondary | ICD-10-CM | POA: Diagnosis not present

## 2023-01-21 DIAGNOSIS — S50811D Abrasion of right forearm, subsequent encounter: Secondary | ICD-10-CM | POA: Diagnosis not present

## 2023-01-22 ENCOUNTER — Other Ambulatory Visit: Payer: Self-pay | Admitting: Hematology

## 2023-01-22 DIAGNOSIS — D649 Anemia, unspecified: Secondary | ICD-10-CM

## 2023-01-23 DIAGNOSIS — K7460 Unspecified cirrhosis of liver: Secondary | ICD-10-CM | POA: Diagnosis not present

## 2023-01-23 DIAGNOSIS — S32519D Fracture of superior rim of unspecified pubis, subsequent encounter for fracture with routine healing: Secondary | ICD-10-CM | POA: Diagnosis not present

## 2023-01-23 DIAGNOSIS — E1142 Type 2 diabetes mellitus with diabetic polyneuropathy: Secondary | ICD-10-CM | POA: Diagnosis not present

## 2023-01-23 DIAGNOSIS — S22020D Wedge compression fracture of second thoracic vertebra, subsequent encounter for fracture with routine healing: Secondary | ICD-10-CM | POA: Diagnosis not present

## 2023-01-24 DIAGNOSIS — S32519D Fracture of superior rim of unspecified pubis, subsequent encounter for fracture with routine healing: Secondary | ICD-10-CM | POA: Diagnosis not present

## 2023-01-24 DIAGNOSIS — S22020D Wedge compression fracture of second thoracic vertebra, subsequent encounter for fracture with routine healing: Secondary | ICD-10-CM | POA: Diagnosis not present

## 2023-01-24 DIAGNOSIS — K7460 Unspecified cirrhosis of liver: Secondary | ICD-10-CM | POA: Diagnosis not present

## 2023-01-24 DIAGNOSIS — E1142 Type 2 diabetes mellitus with diabetic polyneuropathy: Secondary | ICD-10-CM | POA: Diagnosis not present

## 2023-01-25 ENCOUNTER — Other Ambulatory Visit: Payer: Self-pay

## 2023-01-25 ENCOUNTER — Inpatient Hospital Stay: Payer: Medicare HMO

## 2023-01-25 ENCOUNTER — Telehealth: Payer: Self-pay

## 2023-01-25 DIAGNOSIS — D72829 Elevated white blood cell count, unspecified: Secondary | ICD-10-CM | POA: Diagnosis not present

## 2023-01-25 DIAGNOSIS — K7460 Unspecified cirrhosis of liver: Secondary | ICD-10-CM | POA: Diagnosis not present

## 2023-01-25 DIAGNOSIS — E612 Magnesium deficiency: Secondary | ICD-10-CM | POA: Diagnosis not present

## 2023-01-25 DIAGNOSIS — D696 Thrombocytopenia, unspecified: Secondary | ICD-10-CM | POA: Diagnosis not present

## 2023-01-25 NOTE — Telephone Encounter (Signed)
Spoke with Burman Riis, NP Nurse Navigator for SNF stay at [facility] regarding pt's Nplate Injections.  Joyice Faster stated that the pt fell and was taken to the hospital regarding her injuries.  Pt was d/c to a Rehab as a result of the fall.  Joyice Faster stated pt's d/c from the rehab is unknown at this time but pt is working with PT/OT.  Joyice Faster stated that the Rehab cannot give the patient her weekly Nplate Injection; therefore, blood transfusions will be administered instead.  Gabby requested verbal infusion parameters.  Provided Gabby with verbal orders for wkly CBC w/Diff and to administer blood products based on these parameters:  Blood Product Transfusion Parameters: If Plts <15 give 1unit of Platelets If Hbg <8 give 1 unit of PRBCs No Premeds needed   Joyice Faster stated they will fax labs results to Dr. Latanya Maudlin office and will make the necessary arrangements to transport pt to the local hospital infusion clinic for transfusions.  Notified Dr. Mosetta Putt of the pt's recent admission to Rehab.

## 2023-01-26 ENCOUNTER — Other Ambulatory Visit: Payer: Self-pay

## 2023-01-26 DIAGNOSIS — D72829 Elevated white blood cell count, unspecified: Secondary | ICD-10-CM | POA: Diagnosis not present

## 2023-01-26 DIAGNOSIS — K7460 Unspecified cirrhosis of liver: Secondary | ICD-10-CM | POA: Diagnosis not present

## 2023-01-26 DIAGNOSIS — E612 Magnesium deficiency: Secondary | ICD-10-CM | POA: Diagnosis not present

## 2023-01-26 DIAGNOSIS — D696 Thrombocytopenia, unspecified: Secondary | ICD-10-CM | POA: Diagnosis not present

## 2023-01-27 DIAGNOSIS — D72829 Elevated white blood cell count, unspecified: Secondary | ICD-10-CM | POA: Diagnosis not present

## 2023-01-27 DIAGNOSIS — K7460 Unspecified cirrhosis of liver: Secondary | ICD-10-CM | POA: Diagnosis not present

## 2023-01-27 DIAGNOSIS — C9210 Chronic myeloid leukemia, BCR/ABL-positive, not having achieved remission: Secondary | ICD-10-CM | POA: Diagnosis not present

## 2023-01-27 DIAGNOSIS — D696 Thrombocytopenia, unspecified: Secondary | ICD-10-CM | POA: Diagnosis not present

## 2023-01-30 ENCOUNTER — Telehealth: Payer: Self-pay

## 2023-01-30 ENCOUNTER — Other Ambulatory Visit: Payer: Self-pay

## 2023-01-30 DIAGNOSIS — C9210 Chronic myeloid leukemia, BCR/ABL-positive, not having achieved remission: Secondary | ICD-10-CM | POA: Diagnosis not present

## 2023-01-30 DIAGNOSIS — Z7409 Other reduced mobility: Secondary | ICD-10-CM | POA: Diagnosis not present

## 2023-01-30 DIAGNOSIS — G894 Chronic pain syndrome: Secondary | ICD-10-CM | POA: Diagnosis not present

## 2023-01-30 DIAGNOSIS — S50811A Abrasion of right forearm, initial encounter: Secondary | ICD-10-CM | POA: Diagnosis not present

## 2023-01-30 DIAGNOSIS — K7460 Unspecified cirrhosis of liver: Secondary | ICD-10-CM | POA: Diagnosis not present

## 2023-01-30 DIAGNOSIS — M6281 Muscle weakness (generalized): Secondary | ICD-10-CM | POA: Diagnosis not present

## 2023-01-30 DIAGNOSIS — S22020D Wedge compression fracture of second thoracic vertebra, subsequent encounter for fracture with routine healing: Secondary | ICD-10-CM | POA: Diagnosis not present

## 2023-01-30 DIAGNOSIS — D696 Thrombocytopenia, unspecified: Secondary | ICD-10-CM | POA: Diagnosis not present

## 2023-01-30 NOTE — Telephone Encounter (Signed)
Spoke w/Gabby Justice, NP w/[facility] to f/u on estimated d/c date for pt from [facility].  Joyice Faster stated she will contact [facility] to get an anticipated d/c date and will f/u with Dr. Latanya Maudlin office.

## 2023-01-31 DIAGNOSIS — E1142 Type 2 diabetes mellitus with diabetic polyneuropathy: Secondary | ICD-10-CM | POA: Diagnosis not present

## 2023-01-31 DIAGNOSIS — S32519D Fracture of superior rim of unspecified pubis, subsequent encounter for fracture with routine healing: Secondary | ICD-10-CM | POA: Diagnosis not present

## 2023-01-31 DIAGNOSIS — S22020D Wedge compression fracture of second thoracic vertebra, subsequent encounter for fracture with routine healing: Secondary | ICD-10-CM | POA: Diagnosis not present

## 2023-01-31 DIAGNOSIS — R188 Other ascites: Secondary | ICD-10-CM | POA: Diagnosis not present

## 2023-01-31 DIAGNOSIS — K7460 Unspecified cirrhosis of liver: Secondary | ICD-10-CM | POA: Diagnosis not present

## 2023-02-01 ENCOUNTER — Inpatient Hospital Stay: Payer: Medicare HMO

## 2023-02-01 DIAGNOSIS — S22020D Wedge compression fracture of second thoracic vertebra, subsequent encounter for fracture with routine healing: Secondary | ICD-10-CM | POA: Diagnosis not present

## 2023-02-01 DIAGNOSIS — K7460 Unspecified cirrhosis of liver: Secondary | ICD-10-CM | POA: Diagnosis not present

## 2023-02-01 DIAGNOSIS — C9210 Chronic myeloid leukemia, BCR/ABL-positive, not having achieved remission: Secondary | ICD-10-CM | POA: Diagnosis not present

## 2023-02-01 DIAGNOSIS — G894 Chronic pain syndrome: Secondary | ICD-10-CM | POA: Diagnosis not present

## 2023-02-01 DIAGNOSIS — D696 Thrombocytopenia, unspecified: Secondary | ICD-10-CM | POA: Diagnosis not present

## 2023-02-02 DIAGNOSIS — K7460 Unspecified cirrhosis of liver: Secondary | ICD-10-CM | POA: Diagnosis not present

## 2023-02-02 DIAGNOSIS — M6281 Muscle weakness (generalized): Secondary | ICD-10-CM | POA: Diagnosis not present

## 2023-02-02 DIAGNOSIS — S22020D Wedge compression fracture of second thoracic vertebra, subsequent encounter for fracture with routine healing: Secondary | ICD-10-CM | POA: Diagnosis not present

## 2023-02-02 DIAGNOSIS — Z9181 History of falling: Secondary | ICD-10-CM | POA: Diagnosis not present

## 2023-02-03 ENCOUNTER — Encounter: Payer: Self-pay | Admitting: Internal Medicine

## 2023-02-03 DIAGNOSIS — S22020D Wedge compression fracture of second thoracic vertebra, subsequent encounter for fracture with routine healing: Secondary | ICD-10-CM | POA: Diagnosis not present

## 2023-02-03 DIAGNOSIS — K7460 Unspecified cirrhosis of liver: Secondary | ICD-10-CM | POA: Diagnosis not present

## 2023-02-03 DIAGNOSIS — M6281 Muscle weakness (generalized): Secondary | ICD-10-CM | POA: Diagnosis not present

## 2023-02-03 DIAGNOSIS — Z9181 History of falling: Secondary | ICD-10-CM | POA: Diagnosis not present

## 2023-02-03 MED FILL — Dexamethasone Sodium Phosphate Inj 100 MG/10ML: INTRAMUSCULAR | Qty: 1 | Status: AC

## 2023-02-05 NOTE — Assessment & Plan Note (Deleted)
-

## 2023-02-05 NOTE — Assessment & Plan Note (Deleted)
Nplate injection Platelet administration as needed per treatment  parameters.

## 2023-02-05 NOTE — Progress Notes (Deleted)
Patient Care Team: Natalia Leatherwood, DO as PCP - General (Family Medicine) Danis, Andreas Blower, MD as Consulting Physician (Gastroenterology) Malachy Mood, MD as Consulting Physician (Hematology) Glendale Chard, DO as Consulting Physician (Neurology)  Clinic Day:  02/05/2023  Referring physician: Malachy Mood, MD  ASSESSMENT & PLAN:   Assessment & Plan: [name] (chronic myelomonocytic leukemia) (HCC) -diagnosed in 10/2020, presented with severe thrombocytopenia  --She began azacitidine 75mg /m2 daily days 1-5 q. 28 days started on 12/28/20 -She had worsening thrombocytopenia after stopping Nplate, so it was restarted and will continue weekly  -Bone marrow biopsy 03/18/21 showed stable disease, no increase in blasts. -she is tolerating treatment well, blood counts are overall stable  -In 09/2022, she received a decreased dose of Azacitidine at 60 mg/m2 due to worsening thrombocytopenia, back on full dose in May, thrombocytopenia has improved, she is tolerating it well. -she has been following Dr. Sharyne Richters at [facility] also  -Zometa has been held due to her dental issue  Thrombocytopenia (HCC) Nplate injection Platelet administration as needed per treatment  parameters.    Plan: Review labs Day 1 azacitidine today.  ?platelets Nplate as scheduled.  Labs/flush, follow up, and infusion as scheduled   The patient understands the plans discussed today and is in agreement with them.  She knows to contact our office if she develops concerns prior to her next appointment.  I provided *** minutes of face-to-face time during this encounter and > 50% was spent counseling as documented under my assessment and plan.    Carlean Jews, NP  [place] [facility] [address] [place] [address] Dept: [phone] Dept Fax: [phone]   No orders of the defined types were placed in this encounter.     CHIEF COMPLAINT:  CC: chronic  myelomonocytic leukemia   Current Treatment:  azacitidine days 1 through  5 on 28 day cycle. Nplate injections weekly   INTERVAL HISTORY:  Kathleen Gibson is here today for repeat clinical assessment. She denies fevers or chills. She denies pain. Her appetite is good. Her weight {Weight change:10426}.  -diagnosed in 10/2020, presented with severe thrombocytopenia  --She began azacitidine 75mg /m2 daily days 1-5 q. 28 days started on 12/28/20 -She had worsening thrombocytopenia after stopping Nplate, so it was restarted and will continue weekly  -Bone marrow biopsy 03/18/21 showed stable disease, no increase in blasts. -she is tolerating treatment well, blood counts are overall stable  -In 09/2022, she received a decreased dose of Azacitidine at 60 mg/m2 due to worsening thrombocytopenia, back on full dose in May, thrombocytopenia has improved, she is tolerating it well. -she has been following Dr. Sharyne Richters at [facility] also   I have reviewed the past medical history, past surgical history, social history and family history with the patient and they are unchanged from previous note.  ALLERGIES:  is allergic to asa [aspirin], nsaids, and red blood cells.  MEDICATIONS:  Current Outpatient Medications  Medication Sig Dispense Refill   cyclobenzaprine (FLEXERIL) 5 MG tablet Take 1 tablet (5 mg total) by mouth 3 (three) times daily as needed for muscle spasms. 30 tablet 0   fluocinonide cream (LIDEX) 0.05 % Apply 1 Application topically 2 (two) times daily. 60 g 1   furosemide (LASIX) 20 MG tablet TAKE 1 TABLET BY MOUTH EVERY DAY FOR UP TO 7 DAYS THEN AS NEEDED FOR LEG EDEMA 90 tablet 1   furosemide (LASIX) 20 MG tablet Take 1 tablet (20 mg  total) by mouth daily as needed. 20 tablet 0   glucose blood (ONETOUCH VERIO) test strip Monitor blood sugars twice daily 100 each 5   KLOR-CON M10 10 MEQ tablet TAKE 1 TABLET BY MOUTH TWICE A DAY TAKE ALONG WITH FUROSEMIDE ONLY 180 tablet 1   Lancets (ONETOUCH ULTRASOFT) lancets  Monitor blood sugars twice daily 100 each 5   lidocaine (HM LIDOCAINE PATCH) 4 % Place 2 patches onto the skin daily. 60 patch 0   lidocaine-prilocaine (EMLA) cream APPLY 2 GRAMS TO PORT-A-CATH SITE 30-60 MINUTES PRIOR TO PORT ACCESS AS NEEDED 30 g 1   LORazepam (ATIVAN) 1 MG tablet TAKE 1 TABLET BY MOUTH EVERY 8 HOURS AS NEEDED FOR ANXIETY 30 tablet 0   metoprolol succinate (TOPROL-XL) 25 MG 24 hr tablet Take 1 tablet (25 mg total) by mouth daily. 90 tablet 1   naloxone (NARCAN) nasal spray 4 mg/0.1 mL Place 1 spray into the nose once as needed (overdose). 2 each 0   omeprazole (PRILOSEC) 20 MG capsule Take 20 mg by mouth daily.     ondansetron (ZOFRAN-ODT) 8 MG disintegrating tablet Take 0.5 tablets (4 mg total) by mouth every 8 (eight) hours as needed for nausea or vomiting. 30 tablet 3   Oxycodone HCl 10 MG TABS Take 1 tablet (10 mg total) by mouth 2 (two) times daily as needed. 60 tablet 0   Oxycodone HCl 10 MG TABS Take 1 tablet (10 mg total) by mouth 2 (two) times daily as needed. 60 tablet 0   [START ON 02/21/2023] Oxycodone HCl 10 MG TABS Take 1 tablet (10 mg total) by mouth 2 (two) times daily as needed. 60 tablet 0   PARoxetine (PAXIL) 10 MG tablet Take by mouth.     polyethylene glycol powder (GLYCOLAX/MIRALAX) 17 GM/SCOOP powder Dissolve 1 capful (17 g) in water and drink 2 (two) times daily. (Patient taking differently: Take 17 g by mouth daily.) 510 g 2   pregabalin (LYRICA) 150 MG capsule Take 1 capsule (150 mg total) by mouth 2 (two) times daily. 180 capsule 1   prochlorperazine (COMPAZINE) 10 MG tablet Take 1 tablet (10 mg total) by mouth every 6 (six) hours as needed. 30 tablet 3   promethazine-dextromethorphan (PROMETHAZINE-DM) 6.25-15 MG/5ML syrup Take 5 mLs by mouth 4 (four) times daily as needed. 118 mL 0   SENEXON-S 8.6-50 MG tablet TAKE 2 TABLETS BY MOUTH AT BEDTIME AS NEEDED FOR MILD CONSTIPATION. 60 tablet 1   sitaGLIPtin (JANUVIA) 50 MG tablet Take 1 tablet (50 mg total)  by mouth daily. 90 tablet 1   triamcinolone ointment (KENALOG) 0.5 % Apply 1 Application topically 2 (two) times daily. 30 g 0   No current facility-administered medications for this visit.   Facility-Administered Medications Ordered in Other Visits  Medication Dose Route Frequency Provider Last Rate Last Admin   0.9 %  sodium chloride infusion (Manually program via Guardrails IV Fluids)  250 mL Intravenous Once Malachy Mood, MD        HISTORY OF PRESENT ILLNESS:   Oncology History  [name] (chronic myelomonocytic leukemia) (HCC)  10/29/2020 Imaging   CT CAP  IMPRESSION: 1. Splenomegaly with pathologically enlarged lymph nodes above and below the diaphragm, with overall stable to minimally increased abdominal adenopathy and interval progression of the pelvic adenopathy. 2. Small volume abdominopelvic ascites with diffuse mesenteric stranding. 3. Scattered bilateral pulmonary micro nodules measuring 1-2 mm. 4. Distended gallbladder with some layering hyperdense material representing layering sludge and tiny stones seen on  prior ultrasound. 5. Aortic atherosclerosis.   10/30/2020 Pathology Results   DIAGNOSIS:   BONE MARROW, ASPIRATE, CLOT, CORE:  -  Hypercellular bone marrow with panhyperplasia, atypia, and no  increase in blasts  -  See comment   PERIPHERAL BLOOD:  -  Marked thrombocytopenia  -  Absolute monocytosis  -  Normocytic anemia  -  See CBC data and comment   COMMENT:  The bone marrow is hypercellular for age (approximately 80%) with myeloid hyperplasia with maturational left shift, erythroid hyperplasia, and increased megakaryocytes.  Mild multilineage atypia is present. Blasts are not increased on aspirate smears (1% by manual differential count) or by CD34 immunohistochemistry on the core biopsy.  Concurrent flow cytometric analysis of the bone marrow aspirate demonstrates increased monocytes, and no increase in blasts or abnormal lymphoid population (see [id number]).   Monocytes are also increased in peripheral  blood, persistent per electronic medical record.  In aggregate, the  findings raise the possibility of a myeloid neoplasm with the  differential diagnosis including a low-grade myelodysplastic syndrome and chronic myelomonocytic leukemia (dysplastic type).    ADDENDUM:  A reticulin special stain performed on the bone marrow core biopsy reveals no significant increase in reticulin fibrosis.  ADDENDUM:  CYTOGENETIC RESULTS:  Karyotype: 46,XX[20]  Interpretation: NORMAL FEMALE KARYOTYPE   FISH RESULTS:  Results: NORMAL   ADDENDUM:  CD123 immunohistochemistry performed on the core biopsy highlights scattered aggregates of positively staining cells consistent with plasmacytoid dendritic cells.    10/30/2020 Pathology Results   DIAGNOSIS:   BONE MARROW; FLOW CYTOMETRIC ANALYSIS:  -  Increased monocytes  -  Scant B-cells present  -  No immunophenotypically aberrant T-cell population identified  -  No increase in blasts  -  See comment   COMMENT:  Monocytes are relatively increased (12% of all cells), without aberrant expression of CD56.  B-cells comprise <1% of total lymphocytes. CD34-positive blasts are not increased (<1% of all cells).  Correlation with concurrent morphology is recommended for complete diagnostic interpretation and overall blast enumeration (see [id number]).    10/30/2020 Pathology Results   FINAL MICROSCOPIC DIAGNOSIS:   A. LYMPH NODE, RIGHT AXILLARY, NEEDLE CORE BIOPSY:  -Lymphoid tissue present  -See comment   COMMENT:  The sections show several small needle core biopsy fragments of lymphoid tissue displaying degenerative cellular changes/necrosis and hence cannot be accurately evaluated.  Sample for flow cytometric analysis not available.  Immunohistochemical stain for CD3 and CD20 were performed with appropriate controls.  There is a mixture of T and B cells in their apparently respective compartments.  There is no  definite metastatic malignancy.    11/11/2020 Pathology Results   DIAGNOSIS:   LEFT AXILLARY LYMPH NODE EXCISIONAL BIOPSY; FLOW CYTOMETRIC ANALYSIS:  -  No monotypic B-cell or immunophenotypically aberrant T-cell  population identified  -  See comment   COMMENT:  Flow cytometric analysis identifies B-cells with a normal kappa:lambda ratio of 1.7:1.  A subset of the polytypic B-cells expresses CD10. T-cells show a CD4:CD8 ratio of 3.3:1 without immunophenotypic aberrancy with the markers evaluated.  Although these results do not support the diagnosis of a clonal lymphoid population, sampling issues must always be considered when negative results are obtained, as focal lesions may not be represented in the specimen submitted.  In addition, flow cytometric immunophenotyping will not exclude other pathology if present (e.g. Hodgkin lymphoma, some T-cell lymphomas, metastatic and infectious diseases).   11/11/2020 Pathology Results   FINAL MICROSCOPIC DIAGNOSIS:   A. LYMPH NODE, LEFT AXILLARY,  DISSECTION:  -  Follicular hyperplasia with interfollicular expansion  -  See comment   COMMENT:  Sections of the lymph nodes reveal generally preserved lymph node architecture.  There is follicular hyperplasia with some follicles showing increased hyalinization of germinal centers and mild concentric encircling of germinal centers with small lymphocytes (onion skinning). Interfollicular areas are expanded with increased vascular proliferation, small lymphocytes, plasma cells, histiocytic cells, and patchy neutrophils.  The lymph node capsule is variably thickened by fibrosis with few plasma cells.   A panel of immunohistochemical stains is performed for further  characterization.  CD3 and CD20/PAX5 highlight T-cell and B-cell  compartments, respectively.  Germinal centers are highlighted by CD10 and BCL6 with appropriate absent expression of BCL2.  CD5 is similar to CD3.  CD43 also stains the T-cells.   CD4-positive T-cells exceed CD8-positive T-cells.  CD30 highlights scattered immunoblasts.  CD21 reveals intact follicular dendritic cell meshworks.  CD68 highlights increased histiocytic cells.  HHV 8 is negative.  T. pallidum reveals no definitive organisms.  Pancytokeratin is negative.  TdT stains rare cells.  CD138 highlights plasma cells which are not increased in number and show polytypic light chain expression by kappa/lambda in situ  hybridization.  EBV is negative by in situ hybridization.   Concurrent flow cytometric analysis is negative for a monoclonal B-cell or immunophenotypically aberrant T-cell population (see [phone]).   Together, the findings above are non-specific and can be seen in  reactive and infectious processes as well as Castleman disease.  There is no definitive morphologic or flow cytometric evidence of involvement by a lymphoproliferative disorder from the current workup.   12/17/2020 Initial Diagnosis   [name] (chronic myelomonocytic leukemia) (HCC)   12/28/2020 - 02/03/2022 Chemotherapy   Patient is on Treatment Plan : MYELODYSPLASIA  Azacitidine IV D1-5 q28d     03/18/2021 Pathology Results   Final Diagnosis    BONE MARROW:             Persistent chronic myelomonocytic leukemia in a hypercellular bone marrow with increased monocytes and no increase in blasts.   Comment    Flow cytometric analysis showed no increase in blasts and 17% monocytes.A next generation myeloid panel showed the presence of the following mutations: NRAS, SH2B3, PHF6 and TET2. CD34 and CD117 immunstains do not show an increase in blasts. The findings are consistent with persistent [name] with a decrease in the number of blasts compared to that seen on the previous bone marrow.    Final Interpretation      BONE MARROW; FLOW CYTOMETRIC ANALYSIS:   No increased blasts identified. (see comment)  Monocytosis (17% of total events).    COMMENT: Flow cytometry identified about 0.6% of total events  as immature cells. These cells express CD45 (dim), CD34, CD117, HLA-DR, CD38, CD33 (variable). This immunophenotype is consistent with normal myeloblasts. In addition, monocytes are increased and comprise of approximately 17% of total events. These monocytes show normal expression of CD33/CD64/CD14/CD11b with variable expression of CD4. Correlation with concurrent morphology and clinical data is recommended (see [id number]).    FLOW CYTOMETRY ANALYSIS: CD45 versus side scatter analysis demonstrate a predominance of granulocytes (~70%), monocytes (~17%) and lymphocytes (~6%). No significant increase in blasts identified. All tested markers were used for adequate analysis of the cells and were appropriately reviewed.     12/28/2021 -  Chemotherapy   Patient is on Treatment Plan : MYELODYSPLASIA  Azacitidine IV D1-5 q28d         REVIEW OF SYSTEMS:   Constitutional:  Denies fevers, chills or abnormal weight loss Eyes: Denies blurriness of vision Ears, nose, mouth, throat, and face: Denies mucositis or sore throat Respiratory: Denies cough, dyspnea or wheezes Cardiovascular: Denies palpitation, chest discomfort or lower extremity swelling Gastrointestinal:  Denies nausea, heartburn or change in bowel habits Skin: Denies abnormal skin rashes Lymphatics: Denies new lymphadenopathy or easy bruising Neurological:Denies numbness, tingling or new weaknesses Behavioral/Psych: Mood is stable, no new changes  All other systems were reviewed with the patient and are negative.   VITALS:  There were no vitals taken for this visit.  Wt Readings from Last 3 Encounters:  01/09/23 153 lb 14.4 oz (69.8 kg)  12/27/22 155 lb 9.6 oz (70.6 kg)  12/21/22 161 lb 3.2 oz (73.1 kg)    There is no height or weight on file to calculate BMI.  Performance status (ECOG): {CHL ONC [id number]  PHYSICAL EXAM:   GENERAL:alert, no distress and comfortable SKIN: skin color, texture, turgor are normal, no rashes or  significant lesions EYES: normal, Conjunctiva are pink and non-injected, sclera clear OROPHARYNX:no exudate, no erythema and lips, buccal mucosa, and tongue normal  NECK: supple, thyroid normal size, non-tender, without nodularity LYMPH:  no palpable lymphadenopathy in the cervical, axillary or inguinal LUNGS: clear to auscultation and percussion with normal breathing effort HEART: regular rate & rhythm and no murmurs and no lower extremity edema ABDOMEN:abdomen soft, non-tender and normal bowel sounds Musculoskeletal:no cyanosis of digits and no clubbing  NEURO: alert & oriented x 3 with fluent speech, no focal motor/sensory deficits  LABORATORY DATA:  I have reviewed the data as listed    Component Value Date/Time   NA 142 01/11/2023 0739   NA 137 02/18/2021 0000   K 3.5 01/11/2023 0739   CL 109 01/11/2023 0739   CO2 26 01/11/2023 0739   GLUCOSE 187 (H) 01/11/2023 0739   BUN 19 01/11/2023 0739   BUN 13 02/18/2021 0000   CREATININE 0.89 01/11/2023 0739   CREATININE 0.78 01/09/2023 0858   CALCIUM 8.5 (L) 01/11/2023 0739   PROT 6.3 (L) 01/09/2023 0858   ALBUMIN 2.8 (L) 01/09/2023 0858   AST 31 01/09/2023 0858   ALT 21 01/09/2023 0858   ALKPHOS 143 (H) 01/09/2023 0858   BILITOT 2.0 (H) 01/09/2023 0858   GFRNONAA >60 01/11/2023 0739   GFRNONAA >60 01/09/2023 0858   GFRAA 45 (L) 09/07/2019 0350   GFRAA >60 11/20/2017 0924     Lab Results  Component Value Date   WBC 6.8 01/11/2023   NEUTROABS 4.5 01/11/2023   HGB 9.9 (L) 01/11/2023   HCT 29.4 (L) 01/11/2023   MCV 91.9 01/11/2023   PLT 123 (L) 01/11/2023

## 2023-02-06 ENCOUNTER — Inpatient Hospital Stay: Payer: Medicare HMO

## 2023-02-06 ENCOUNTER — Telehealth: Payer: Self-pay | Admitting: *Deleted

## 2023-02-06 ENCOUNTER — Inpatient Hospital Stay: Payer: Medicare HMO | Admitting: Nurse Practitioner

## 2023-02-06 ENCOUNTER — Telehealth: Payer: Self-pay

## 2023-02-06 DIAGNOSIS — Z9181 History of falling: Secondary | ICD-10-CM | POA: Diagnosis not present

## 2023-02-06 DIAGNOSIS — Z7409 Other reduced mobility: Secondary | ICD-10-CM | POA: Diagnosis not present

## 2023-02-06 DIAGNOSIS — M6281 Muscle weakness (generalized): Secondary | ICD-10-CM | POA: Diagnosis not present

## 2023-02-06 DIAGNOSIS — C9310 Chronic myelomonocytic leukemia not having achieved remission: Secondary | ICD-10-CM

## 2023-02-06 DIAGNOSIS — L299 Pruritus, unspecified: Secondary | ICD-10-CM | POA: Diagnosis not present

## 2023-02-06 DIAGNOSIS — K7460 Unspecified cirrhosis of liver: Secondary | ICD-10-CM | POA: Diagnosis not present

## 2023-02-06 DIAGNOSIS — D696 Thrombocytopenia, unspecified: Secondary | ICD-10-CM

## 2023-02-06 DIAGNOSIS — S50811D Abrasion of right forearm, subsequent encounter: Secondary | ICD-10-CM | POA: Diagnosis not present

## 2023-02-06 MED FILL — Dexamethasone Sodium Phosphate Inj 100 MG/10ML: INTRAMUSCULAR | Qty: 1 | Status: AC

## 2023-02-06 NOTE — Telephone Encounter (Signed)
Reached out on the missed appt.  Patient is in a rehab center in [facility].  Her care is going to be happening in the [facility].     Daughter was under the impression that her chart had been routed to [facility].

## 2023-02-06 NOTE — Telephone Encounter (Signed)
Phoned [phone] to attempt to confirm if referral to [facility] in [facility] was already in place.  Had to leave a detailed message with that facility.  Pending call back.    Patient is in rehab temporarily in [place] until she can locate an open SNF in [place].  Until then she needs her Nplate and Vidaza done at [facility].

## 2023-02-06 NOTE — Telephone Encounter (Signed)
I spoke with pt's daughter regarding today's missed appt. Raynelle Fanning, pt's daughter, said  pt is at the rehab facility in [place] and is being seen at the [facility]. Contact person is Gabe. She said Dr Mosetta Putt should be aware of this and is said to be overseeing her care.  We may contact Gabe at the [facility] if we have any questions or concerns.  Julee said to cancel all future appts and she will let us know when the pt has returned to [facility]

## 2023-02-06 NOTE — Telephone Encounter (Signed)
Daughter Jamesetta So called and left message re:  Pt has not had chemo treatment since she was transferred to  rehab center.   Raynelle Fanning was concerned of delay in pt's chemo treatment.   Raynelle Fanning requested  Chemo order  treatments   to be faxed  to   Gabe  @  [facility]  [facility]     [phone].

## 2023-02-07 ENCOUNTER — Telehealth: Payer: Self-pay

## 2023-02-07 ENCOUNTER — Other Ambulatory Visit: Payer: Self-pay | Admitting: Hematology

## 2023-02-07 ENCOUNTER — Telehealth: Payer: Self-pay | Admitting: Hematology

## 2023-02-07 ENCOUNTER — Inpatient Hospital Stay: Payer: Medicare HMO

## 2023-02-07 NOTE — Telephone Encounter (Signed)
Received TC from pt's daughter Raynelle Fanning this morning. She states that pt is currently in a [facility] facility in [place] and they were unable to coordinate chemotherapy this week. She wants to see if pt can come to [facility] next week for treatment, although a LTC facility arrangement needs to be made for pt in the near future. Daughter states that she and pt will stay with a relative in [place] next week if she can have treatment. Dr. Mosetta Putt made aware. New orders and plan in place for pt to receive chemo 9/3 through 9/9 (2024) at [facility]. Daughter is aware.

## 2023-02-08 ENCOUNTER — Inpatient Hospital Stay: Payer: Medicare HMO

## 2023-02-08 ENCOUNTER — Other Ambulatory Visit: Payer: Self-pay

## 2023-02-08 DIAGNOSIS — D696 Thrombocytopenia, unspecified: Secondary | ICD-10-CM | POA: Diagnosis not present

## 2023-02-08 DIAGNOSIS — E1142 Type 2 diabetes mellitus with diabetic polyneuropathy: Secondary | ICD-10-CM | POA: Diagnosis not present

## 2023-02-08 DIAGNOSIS — I5022 Chronic systolic (congestive) heart failure: Secondary | ICD-10-CM | POA: Diagnosis not present

## 2023-02-08 DIAGNOSIS — K7460 Unspecified cirrhosis of liver: Secondary | ICD-10-CM | POA: Diagnosis not present

## 2023-02-09 ENCOUNTER — Inpatient Hospital Stay: Payer: Medicare HMO

## 2023-02-10 ENCOUNTER — Inpatient Hospital Stay: Payer: Medicare HMO

## 2023-02-14 ENCOUNTER — Other Ambulatory Visit: Payer: Self-pay | Admitting: *Deleted

## 2023-02-14 ENCOUNTER — Inpatient Hospital Stay: Payer: Medicare HMO | Attending: Hematology

## 2023-02-14 ENCOUNTER — Inpatient Hospital Stay: Payer: Medicare HMO

## 2023-02-14 ENCOUNTER — Other Ambulatory Visit: Payer: Self-pay

## 2023-02-14 ENCOUNTER — Encounter: Payer: Self-pay | Admitting: Hematology

## 2023-02-14 ENCOUNTER — Inpatient Hospital Stay (HOSPITAL_BASED_OUTPATIENT_CLINIC_OR_DEPARTMENT_OTHER): Payer: Medicare HMO | Admitting: Hematology

## 2023-02-14 VITALS — BP 112/62 | HR 84 | Temp 98.2°F | Resp 18

## 2023-02-14 DIAGNOSIS — C9310 Chronic myelomonocytic leukemia not having achieved remission: Secondary | ICD-10-CM

## 2023-02-14 DIAGNOSIS — D696 Thrombocytopenia, unspecified: Secondary | ICD-10-CM

## 2023-02-14 DIAGNOSIS — K7460 Unspecified cirrhosis of liver: Secondary | ICD-10-CM | POA: Diagnosis not present

## 2023-02-14 DIAGNOSIS — E538 Deficiency of other specified B group vitamins: Secondary | ICD-10-CM

## 2023-02-14 DIAGNOSIS — Z5111 Encounter for antineoplastic chemotherapy: Secondary | ICD-10-CM | POA: Insufficient documentation

## 2023-02-14 DIAGNOSIS — D693 Immune thrombocytopenic purpura: Secondary | ICD-10-CM | POA: Diagnosis present

## 2023-02-14 DIAGNOSIS — Z79899 Other long term (current) drug therapy: Secondary | ICD-10-CM | POA: Diagnosis not present

## 2023-02-14 DIAGNOSIS — E860 Dehydration: Secondary | ICD-10-CM | POA: Diagnosis not present

## 2023-02-14 DIAGNOSIS — E440 Moderate protein-calorie malnutrition: Secondary | ICD-10-CM | POA: Diagnosis not present

## 2023-02-14 DIAGNOSIS — R188 Other ascites: Secondary | ICD-10-CM | POA: Diagnosis not present

## 2023-02-14 LAB — HEPATIC FUNCTION PANEL
ALT: 24 U/L (ref 0–44)
AST: 27 U/L (ref 15–41)
Albumin: 2.6 g/dL — ABNORMAL LOW (ref 3.5–5.0)
Alkaline Phosphatase: 229 U/L — ABNORMAL HIGH (ref 38–126)
Bilirubin, Direct: 0.7 mg/dL — ABNORMAL HIGH (ref 0.0–0.2)
Indirect Bilirubin: 1.2 mg/dL — ABNORMAL HIGH (ref 0.3–0.9)
Total Bilirubin: 1.9 mg/dL — ABNORMAL HIGH (ref 0.3–1.2)
Total Protein: 6.9 g/dL (ref 6.5–8.1)

## 2023-02-14 LAB — CBC WITH DIFFERENTIAL (CANCER CENTER ONLY)
Abs Immature Granulocytes: 0.53 10*3/uL — ABNORMAL HIGH (ref 0.00–0.07)
Basophils Absolute: 0.1 10*3/uL (ref 0.0–0.1)
Basophils Relative: 1 %
Eosinophils Absolute: 0.2 10*3/uL (ref 0.0–0.5)
Eosinophils Relative: 2 %
HCT: 31.4 % — ABNORMAL LOW (ref 36.0–46.0)
Hemoglobin: 10.4 g/dL — ABNORMAL LOW (ref 12.0–15.0)
Immature Granulocytes: 7 %
Lymphocytes Relative: 12 %
Lymphs Abs: 0.9 10*3/uL (ref 0.7–4.0)
MCH: 29.9 pg (ref 26.0–34.0)
MCHC: 33.1 g/dL (ref 30.0–36.0)
MCV: 90.2 fL (ref 80.0–100.0)
Monocytes Absolute: 1.9 10*3/uL — ABNORMAL HIGH (ref 0.1–1.0)
Monocytes Relative: 25 %
Neutro Abs: 4.1 10*3/uL (ref 1.7–7.7)
Neutrophils Relative %: 53 %
Platelet Count: 47 10*3/uL — ABNORMAL LOW (ref 150–400)
RBC: 3.48 MIL/uL — ABNORMAL LOW (ref 3.87–5.11)
RDW: 20.7 % — ABNORMAL HIGH (ref 11.5–15.5)
WBC Count: 7.6 10*3/uL (ref 4.0–10.5)
nRBC: 0 % (ref 0.0–0.2)

## 2023-02-14 LAB — BASIC METABOLIC PANEL - CANCER CENTER ONLY
Anion gap: 9 (ref 5–15)
BUN: 28 mg/dL — ABNORMAL HIGH (ref 8–23)
CO2: 22 mmol/L (ref 22–32)
Calcium: 8.1 mg/dL — ABNORMAL LOW (ref 8.9–10.3)
Chloride: 109 mmol/L (ref 98–111)
Creatinine: 0.96 mg/dL (ref 0.44–1.00)
GFR, Estimated: >60 mL/min (ref >60)
Glucose, Bld: 155 mg/dL — ABNORMAL HIGH (ref 70–99)
Potassium: 4.5 mmol/L (ref 3.5–5.1)
Sodium: 140 mmol/L (ref 135–145)

## 2023-02-14 LAB — SAMPLE TO BLOOD BANK

## 2023-02-14 LAB — VITAMIN B12: Vitamin B-12: 2535 pg/mL — ABNORMAL HIGH (ref 180–914)

## 2023-02-14 MED ORDER — LACTULOSE 10 GM/15ML PO SOLN: 20.0000 g | Freq: Two times a day (BID) | ORAL | 0 refills | Status: DC | PRN

## 2023-02-14 MED ORDER — SODIUM CHLORIDE 0.9 % IV SOLN: Freq: Once | INTRAVENOUS | Status: AC

## 2023-02-14 MED ORDER — SODIUM CHLORIDE 0.9 % IV SOLN
75.0000 mg/m2 | Freq: Once | INTRAVENOUS | Status: AC
  Administered 2023-02-14: 130 mg via INTRAVENOUS
  Filled 2023-02-14: qty 13

## 2023-02-14 MED ORDER — SODIUM CHLORIDE 0.9% FLUSH
10.0000 mL | INTRAVENOUS | Status: DC | PRN
  Administered 2023-02-14: 10 mL

## 2023-02-14 MED ORDER — HEPARIN SOD (PORK) LOCK FLUSH 100 UNIT/ML IV SOLN
500.0000 [IU] | Freq: Once | INTRAVENOUS | Status: AC | PRN
  Administered 2023-02-14: 500 [IU]

## 2023-02-14 MED ORDER — SODIUM CHLORIDE 0.9 % IV SOLN
10.0000 mg | Freq: Once | INTRAVENOUS | Status: AC
  Administered 2023-02-14: 10 mg via INTRAVENOUS
  Filled 2023-02-14: qty 10

## 2023-02-14 NOTE — Assessment & Plan Note (Signed)
-  secondary to [name] -She has been on Nplate weekly -Needs to treatment past months due to her hospital admission and rehab -Will restart today February 14, 2023, and continue weekly

## 2023-02-14 NOTE — Progress Notes (Signed)
[facility]   Telephone:([phone] Fax:(336) [phone]   Clinic Follow up Note   Patient Care Team: Natalia Leatherwood, DO as PCP - General (Family Medicine) Myrtie Neither Andreas Blower, MD as Consulting Physician (Gastroenterology) Malachy Mood, MD as Consulting Physician (Hematology) Glendale Chard, DO as Consulting Physician (Neurology)  Date of Service:  02/14/2023  I connected with Kathleen Gibson on 02/14/2023 at  2:00 PM EDT by video enabled telemedicine visit and verified that I am speaking with the correct person using two identifiers.  I discussed the limitations, risks, security and privacy concerns of performing an evaluation and management service by telephone and the availability of in person appointments. I also discussed with the patient that there may be a patient responsible charge related to this service. The patient expressed understanding and agreed to proceed.   Other persons participating in the visit and their role in the encounter:  Daughter  Patient's location:  [facility] Provider's location:[facility]  Office  CHIEF COMPLAINT: f/u of [name]   CURRENT THERAPY:  Azacitidine days 1-5 q28 days, started 12/28/20 -Nplate 95mcg/kg weekly -Platelet transfusion as needed (plt<15 -20K), blood transfusion if Hg<8.0  ASSESSMENT & PLAN:  [name] is a 72 y.o. female with    [name] (chronic myelomonocytic leukemia) (HCC) -diagnosed in 10/2020, presented with severe thrombocytopenia  --She began azacitidine 75mg /m2 daily days 1-5 q. 28 days started on 12/28/20 -She had worsening thrombocytopenia after stopping Nplate, so it was restarted and will continue weekly  -Bone marrow biopsy 03/18/21 showed stable disease, no increase in blasts. -she is tolerating treatment well, blood counts are overall stable  -In 09/2022, she received a decreased dose of Azacitidine at 60 mg/m2 due to worsening thrombocytopenia, back on full dose in May, thrombocytopenia has improved, she is tolerating it  well. -she has been following Dr. Sharyne Richters at [facility] also  -Zometa has been held due to her dental issue  Thrombocytopenia (HCC) -secondary to [name] -She has been on Nplate weekly -Needs to treatment past months due to her hospital admission and rehab -Will restart today February 14, 2023, and continue weekly  Liver cirrhosis [facility]) -CT scan April 2024 showed a mild cirrhosis, new since 2022.  However she has had a splenomegaly since 2022, likely secondary to [name] -She has developed recurrent ascites since August 2024, and requires frequent paracentesis -I will schedule her paracentesis for her next week, and repeat as needed -Will refer her to liver clinic NP Dawn Drazek  PLAN: - I Refill  Lactulose -lab reviewed platelet count 47. Hg 10.4 -proceed with chemo treatment today and Nplate injection -lab and Nplate injection weekly X6 -f/u in 2 weeks   SUMMARY OF ONCOLOGIC HISTORY: Oncology History  [name] (chronic myelomonocytic leukemia) (HCC)  10/29/2020 Imaging   CT CAP  IMPRESSION: 1. Splenomegaly with pathologically enlarged lymph nodes above and below the diaphragm, with overall stable to minimally increased abdominal adenopathy and interval progression of the pelvic adenopathy. 2. Small volume abdominopelvic ascites with diffuse mesenteric stranding. 3. Scattered bilateral pulmonary micro nodules measuring 1-2 mm. 4. Distended gallbladder with some layering hyperdense material representing layering sludge and tiny stones seen on prior ultrasound. 5. Aortic atherosclerosis.   10/30/2020 Pathology Results   DIAGNOSIS:   BONE MARROW, ASPIRATE, CLOT, CORE:  -  Hypercellular bone marrow with panhyperplasia, atypia, and no  increase in blasts  -  See comment   PERIPHERAL BLOOD:  -  Marked thrombocytopenia  -  Absolute monocytosis  -  Normocytic anemia  -  See CBC data and comment   COMMENT:  The bone marrow is hypercellular for age (approximately 80%) with myeloid  hyperplasia with maturational left shift, erythroid hyperplasia, and increased megakaryocytes.  Mild multilineage atypia is present. Blasts are not increased on aspirate smears (1% by manual differential count) or by CD34 immunohistochemistry on the core biopsy.  Concurrent flow cytometric analysis of the bone marrow aspirate demonstrates increased monocytes, and no increase in blasts or abnormal lymphoid population (see [id number]).  Monocytes are also increased in peripheral  blood, persistent per electronic medical record.  In aggregate, the  findings raise the possibility of a myeloid neoplasm with the  differential diagnosis including a low-grade myelodysplastic syndrome and chronic myelomonocytic leukemia (dysplastic type).    ADDENDUM:  A reticulin special stain performed on the bone marrow core biopsy reveals no significant increase in reticulin fibrosis.  ADDENDUM:  CYTOGENETIC RESULTS:  Karyotype: 46,XX[20]  Interpretation: NORMAL FEMALE KARYOTYPE   FISH RESULTS:  Results: NORMAL   ADDENDUM:  CD123 immunohistochemistry performed on the core biopsy highlights scattered aggregates of positively staining cells consistent with plasmacytoid dendritic cells.    10/30/2020 Pathology Results   DIAGNOSIS:   BONE MARROW; FLOW CYTOMETRIC ANALYSIS:  -  Increased monocytes  -  Scant B-cells present  -  No immunophenotypically aberrant T-cell population identified  -  No increase in blasts  -  See comment   COMMENT:  Monocytes are relatively increased (12% of all cells), without aberrant expression of CD56.  B-cells comprise <1% of total lymphocytes. CD34-positive blasts are not increased (<1% of all cells).  Correlation with concurrent morphology is recommended for complete diagnostic interpretation and overall blast enumeration (see [id number]).    10/30/2020 Pathology Results   FINAL MICROSCOPIC DIAGNOSIS:   A. LYMPH NODE, RIGHT AXILLARY, NEEDLE CORE BIOPSY:  -Lymphoid tissue present   -See comment   COMMENT:  The sections show several small needle core biopsy fragments of lymphoid tissue displaying degenerative cellular changes/necrosis and hence cannot be accurately evaluated.  Sample for flow cytometric analysis not available.  Immunohistochemical stain for CD3 and CD20 were performed with appropriate controls.  There is a mixture of T and B cells in their apparently respective compartments.  There is no definite metastatic malignancy.    11/11/2020 Pathology Results   DIAGNOSIS:   LEFT AXILLARY LYMPH NODE EXCISIONAL BIOPSY; FLOW CYTOMETRIC ANALYSIS:  -  No monotypic B-cell or immunophenotypically aberrant T-cell  population identified  -  See comment   COMMENT:  Flow cytometric analysis identifies B-cells with a normal kappa:lambda ratio of 1.7:1.  A subset of the polytypic B-cells expresses CD10. T-cells show a CD4:CD8 ratio of 3.3:1 without immunophenotypic aberrancy with the markers evaluated.  Although these results do not support the diagnosis of a clonal lymphoid population, sampling issues must always be considered when negative results are obtained, as focal lesions may not be represented in the specimen submitted.  In addition, flow cytometric immunophenotyping will not exclude other pathology if present (e.g. Hodgkin lymphoma, some T-cell lymphomas, metastatic and infectious diseases).   11/11/2020 Pathology Results   FINAL MICROSCOPIC DIAGNOSIS:   A. LYMPH NODE, LEFT AXILLARY, DISSECTION:  -  Follicular hyperplasia with interfollicular expansion  -  See comment   COMMENT:  Sections of the lymph nodes reveal generally preserved lymph node architecture.  There is follicular hyperplasia with some follicles showing increased hyalinization of germinal centers and mild concentric encircling of germinal centers with small lymphocytes (onion skinning). Interfollicular areas  are expanded with increased vascular proliferation, small lymphocytes, plasma cells, histiocytic  cells, and patchy neutrophils.  The lymph node capsule is variably thickened by fibrosis with few plasma cells.   A panel of immunohistochemical stains is performed for further  characterization.  CD3 and CD20/PAX5 highlight T-cell and B-cell  compartments, respectively.  Germinal centers are highlighted by CD10 and BCL6 with appropriate absent expression of BCL2.  CD5 is similar to CD3.  CD43 also stains the T-cells.  CD4-positive T-cells exceed CD8-positive T-cells.  CD30 highlights scattered immunoblasts.  CD21 reveals intact follicular dendritic cell meshworks.  CD68 highlights increased histiocytic cells.  HHV 8 is negative.  T. pallidum reveals no definitive organisms.  Pancytokeratin is negative.  TdT stains rare cells.  CD138 highlights plasma cells which are not increased in number and show polytypic light chain expression by kappa/lambda in situ  hybridization.  EBV is negative by in situ hybridization.   Concurrent flow cytometric analysis is negative for a monoclonal B-cell or immunophenotypically aberrant T-cell population (see [id number]).   Together, the findings above are non-specific and can be seen in  reactive and infectious processes as well as Castleman disease.  There is no definitive morphologic or flow cytometric evidence of involvement by a lymphoproliferative disorder from the current workup.   12/17/2020 Initial Diagnosis   [name] (chronic myelomonocytic leukemia) (HCC)   12/28/2020 - 02/03/2022 Chemotherapy   Patient is on Treatment Plan : MYELODYSPLASIA  Azacitidine IV D1-5 q28d     03/18/2021 Pathology Results   Final Diagnosis    BONE MARROW:             Persistent chronic myelomonocytic leukemia in a hypercellular bone marrow with increased monocytes and no increase in blasts.   Comment    Flow cytometric analysis showed no increase in blasts and 17% monocytes.A next generation myeloid panel showed the presence of the following mutations: NRAS, SH2B3, PHF6 and TET2.  CD34 and CD117 immunstains do not show an increase in blasts. The findings are consistent with persistent [name] with a decrease in the number of blasts compared to that seen on the previous bone marrow.    Final Interpretation      BONE MARROW; FLOW CYTOMETRIC ANALYSIS:   No increased blasts identified. (see comment)  Monocytosis (17% of total events).    COMMENT: Flow cytometry identified about 0.6% of total events as immature cells. These cells express CD45 (dim), CD34, CD117, HLA-DR, CD38, CD33 (variable). This immunophenotype is consistent with normal myeloblasts. In addition, monocytes are increased and comprise of approximately 17% of total events. These monocytes show normal expression of CD33/CD64/CD14/CD11b with variable expression of CD4. Correlation with concurrent morphology and clinical data is recommended (see [id number]).    FLOW CYTOMETRY ANALYSIS: CD45 versus side scatter analysis demonstrate a predominance of granulocytes (~70%), monocytes (~17%) and lymphocytes (~6%). No significant increase in blasts identified. All tested markers were used for adequate analysis of the cells and were appropriately reviewed.     12/28/2021 -  Chemotherapy   Patient is on Treatment Plan : MYELODYSPLASIA  Azacitidine IV D1-5 q28d        INTERVAL HISTORY:  Kathleen Gibson was contacted for a follow up of [name] . She was last seen by me on 01/09/2023. Pt daughter state that the pt is staying with her in [place]. Pt fell and broke 3 ribs, She also reported of having some liver failure. She had some fluid drain from her abdominal.   All other systems were reviewed  with the patient and are negative.  MEDICAL HISTORY:  Past Medical History:  Diagnosis Date   Acute bronchitis due to COVID-19 virus 05/16/2022   Diabetes (HCC)    HLD (hyperlipidemia)    Hypertension    Pernicious anemia    Pneumonia 09/05/2019   Renal insufficiency 09/06/2019   Skin rash 12/21/2022   Vitamin D deficiency      SURGICAL HISTORY: Past Surgical History:  Procedure Laterality Date   ABDOMINAL HERNIA REPAIR  2009   ABDOMINOPLASTY     AXILLARY LYMPH NODE BIOPSY Left 11/11/2020   Procedure: LEFT AXILLARY EXCISIONAL LYMPH NODE BIOPSY;  Surgeon: Darnell Level, MD;  Location: [facility] OR;  Service: General;  Laterality: Left;   CESAREAN SECTION  1978   GASTRIC BYPASS  2000   hemorroid surgery  2007   IR IMAGING GUIDED PORT INSERTION  07/26/2021   IR KYPHO EA ADDL LEVEL THORACIC OR LUMBAR  05/31/2021   IR KYPHO THORACIC WITH BONE BIOPSY  05/31/2021   TONSILLECTOMY AND ADENOIDECTOMY  1957    I have reviewed the social history and family history with the patient and they are unchanged from previous note.  ALLERGIES:  is allergic to asa [aspirin], nsaids, and red blood cells.  MEDICATIONS:  Current Outpatient Medications  Medication Sig Dispense Refill   lactulose (CHRONULAC) 10 GM/15ML solution Take 30 mLs (20 g total) by mouth 2 (two) times daily as needed for mild constipation. As needed to maintain bowel movement 1-3 times a day 236 mL 0   cyclobenzaprine (FLEXERIL) 5 MG tablet Take 1 tablet (5 mg total) by mouth 3 (three) times daily as needed for muscle spasms. 30 tablet 0   fluocinonide cream (LIDEX) 0.05 % Apply 1 Application topically 2 (two) times daily. 60 g 1   furosemide (LASIX) 20 MG tablet TAKE 1 TABLET BY MOUTH EVERY DAY FOR UP TO 7 DAYS THEN AS NEEDED FOR LEG EDEMA 90 tablet 1   furosemide (LASIX) 20 MG tablet Take 1 tablet (20 mg total) by mouth daily as needed. 20 tablet 0   glucose blood (ONETOUCH VERIO) test strip Monitor blood sugars twice daily 100 each 5   KLOR-CON M10 10 MEQ tablet TAKE 1 TABLET BY MOUTH TWICE A DAY TAKE ALONG WITH FUROSEMIDE ONLY 180 tablet 1   Lancets (ONETOUCH ULTRASOFT) lancets Monitor blood sugars twice daily 100 each 5   lidocaine (HM LIDOCAINE PATCH) 4 % Place 2 patches onto the skin daily. 60 patch 0   lidocaine-prilocaine (EMLA) cream APPLY 2 GRAMS TO  PORT-A-CATH SITE 30-60 MINUTES PRIOR TO PORT ACCESS AS NEEDED 30 g 1   LORazepam (ATIVAN) 1 MG tablet TAKE 1 TABLET BY MOUTH EVERY 8 HOURS AS NEEDED FOR ANXIETY 30 tablet 0   metoprolol succinate (TOPROL-XL) 25 MG 24 hr tablet Take 1 tablet (25 mg total) by mouth daily. 90 tablet 1   naloxone (NARCAN) nasal spray 4 mg/0.1 mL Place 1 spray into the nose once as needed (overdose). 2 each 0   omeprazole (PRILOSEC) 20 MG capsule Take 20 mg by mouth daily.     ondansetron (ZOFRAN-ODT) 8 MG disintegrating tablet Take 0.5 tablets (4 mg total) by mouth every 8 (eight) hours as needed for nausea or vomiting. 30 tablet 3   Oxycodone HCl 10 MG TABS Take 1 tablet (10 mg total) by mouth 2 (two) times daily as needed. 60 tablet 0   Oxycodone HCl 10 MG TABS Take 1 tablet (10 mg total) by mouth 2 (two) times  daily as needed. 60 tablet 0   [START ON 02/21/2023] Oxycodone HCl 10 MG TABS Take 1 tablet (10 mg total) by mouth 2 (two) times daily as needed. 60 tablet 0   PARoxetine (PAXIL) 10 MG tablet Take by mouth.     polyethylene glycol powder (GLYCOLAX/MIRALAX) 17 GM/SCOOP powder Dissolve 1 capful (17 g) in water and drink 2 (two) times daily. (Patient taking differently: Take 17 g by mouth daily.) 510 g 2   pregabalin (LYRICA) 150 MG capsule Take 1 capsule (150 mg total) by mouth 2 (two) times daily. 180 capsule 1   prochlorperazine (COMPAZINE) 10 MG tablet Take 1 tablet (10 mg total) by mouth every 6 (six) hours as needed. 30 tablet 3   promethazine-dextromethorphan (PROMETHAZINE-DM) 6.25-15 MG/5ML syrup Take 5 mLs by mouth 4 (four) times daily as needed. 118 mL 0   SENEXON-S 8.6-50 MG tablet TAKE 2 TABLETS BY MOUTH AT BEDTIME AS NEEDED FOR MILD CONSTIPATION. 60 tablet 1   sitaGLIPtin (JANUVIA) 50 MG tablet Take 1 tablet (50 mg total) by mouth daily. 90 tablet 1   triamcinolone ointment (KENALOG) 0.5 % Apply 1 Application topically 2 (two) times daily. 30 g 0   No current facility-administered medications for this  visit.   Facility-Administered Medications Ordered in Other Visits  Medication Dose Route Frequency Provider Last Rate Last Admin   0.9 %  sodium chloride infusion (Manually program via Guardrails IV Fluids)  250 mL Intravenous Once Malachy Mood, MD       sodium chloride flush (NS) 0.9 % injection 10 mL  10 mL Intracatheter PRN Malachy Mood, MD   10 mL at 02/14/23 1545    PHYSICAL EXAMINATION: ECOG PERFORMANCE STATUS: 3 - Symptomatic, >50% confined to bed  There were no vitals filed for this visit. Wt Readings from Last 3 Encounters:  01/09/23 153 lb 14.4 oz (69.8 kg)  12/27/22 155 lb 9.6 oz (70.6 kg)  12/21/22 161 lb 3.2 oz (73.1 kg)     No vitals taken today, Exam not performed today  LABORATORY DATA:  I have reviewed the data as listed    Latest Ref Rng & Units 02/14/2023    1:29 PM 01/11/2023    7:39 AM 01/09/2023    8:58 AM  CBC  WBC 4.0 - 10.5 K/uL 7.6  6.8  4.4   Hemoglobin 12.0 - 15.0 g/dL 10.2  9.9  72.5   Hematocrit 36.0 - 46.0 % 31.4  29.4  32.0   Platelets 150 - 400 K/uL 47  123  89         Latest Ref Rng & Units 02/14/2023    1:29 PM 01/11/2023    7:39 AM 01/09/2023    8:58 AM  CMP  Glucose 70 - 99 mg/dL 366  440  347   BUN 8 - 23 mg/dL 28  19  17    Creatinine 0.44 - 1.00 mg/dL 4.25  9.56  3.87   Sodium 135 - 145 mmol/L 140  142  143   Potassium 3.5 - 5.1 mmol/L 4.5  3.5  3.4   Chloride 98 - 111 mmol/L 109  109  109   CO2 22 - 32 mmol/L 22  26  26    Calcium 8.9 - 10.3 mg/dL 8.1  8.5  8.0   Total Protein 6.5 - 8.1 g/dL 6.9   6.3   Total Bilirubin 0.3 - 1.2 mg/dL 1.9   2.0   Alkaline Phos 38 - 126 U/L 229   143  AST 15 - 41 U/L 27   31   ALT 0 - 44 U/L 24   21       RADIOGRAPHIC STUDIES: I have personally reviewed the radiological images as listed and agreed with the findings in the report. No results found.    Orders Placed This Encounter  Procedures   Comprehensive metabolic panel    Standing Status:   Standing    Number of Occurrences:   50     Standing Expiration Date:   02/14/2024   Ammonia    Standing Status:   Standing    Number of Occurrences:   30    Standing Expiration Date:   02/14/2024   CBC with Differential (Cancer Center Only)    Standing Status:   Future    Standing Expiration Date:   04/09/2024   Basic Metabolic Panel - Cancer Center Only    Standing Status:   Future    Standing Expiration Date:   04/09/2024   All questions were answered. The patient knows to call the clinic with any problems, questions or concerns. No barriers to learning was detected. The total time spent in the appointment was 30 minutes.     Malachy Mood, MD 02/14/2023   Carolin Coy am acting as scribe for Malachy Mood, MD.   I have reviewed the above documentation for accuracy and completeness, and I agree with the above.

## 2023-02-14 NOTE — Assessment & Plan Note (Signed)
-  diagnosed in 10/2020, presented with severe thrombocytopenia  --She began azacitidine 75mg /m2 daily days 1-5 q. 28 days started on 12/28/20 -She had worsening thrombocytopenia after stopping Nplate, so it was restarted and will continue weekly  -Bone marrow biopsy 03/18/21 showed stable disease, no increase in blasts. -she is tolerating treatment well, blood counts are overall stable  -In 09/2022, she received a decreased dose of Azacitidine at 60 mg/m2 due to worsening thrombocytopenia, back on full dose in May, thrombocytopenia has improved, she is tolerating it well. -she has been following Dr. Sharyne Richters at [facility] also  -Zometa has been held due to her dental issue

## 2023-02-14 NOTE — Patient Instructions (Signed)
[facility]  Discharge Instructions: Thank you for choosing [facility] to provide your oncology and hematology care.   If you have a lab appointment with the Cancer Center, please go directly to the Cancer Center and check in at the registration area.   Wear comfortable clothing and clothing appropriate for easy access to any Portacath or PICC line.   We strive to give you quality time with your provider. You may need to reschedule your appointment if you arrive late (15 or more minutes).  Arriving late affects you and other patients whose appointments are after yours.  Also, if you miss three or more appointments without notifying the office, you may be dismissed from the clinic at the provider's discretion.      For prescription refill requests, have your pharmacy contact our office and allow 72 hours for refills to be completed.    Today you received the following chemotherapy and/or immunotherapy agents: Vidaza.      To help prevent nausea and vomiting after your treatment, we encourage you to take your nausea medication as directed.  BELOW ARE SYMPTOMS THAT SHOULD BE REPORTED IMMEDIATELY: *FEVER GREATER THAN 100.4 F (38 C) OR HIGHER *CHILLS OR SWEATING *NAUSEA AND VOMITING THAT IS NOT CONTROLLED WITH YOUR NAUSEA MEDICATION *UNUSUAL SHORTNESS OF BREATH *UNUSUAL BRUISING OR BLEEDING *URINARY PROBLEMS (pain or burning when urinating, or frequent urination) *BOWEL PROBLEMS (unusual diarrhea, constipation, pain near the anus) TENDERNESS IN MOUTH AND THROAT WITH OR WITHOUT PRESENCE OF ULCERS (sore throat, sores in mouth, or a toothache) UNUSUAL RASH, SWELLING OR PAIN  UNUSUAL VAGINAL DISCHARGE OR ITCHING   Items with * indicate a potential emergency and should be followed up as soon as possible or go to the Emergency Department if any problems should occur.  Please show the CHEMOTHERAPY ALERT CARD or IMMUNOTHERAPY ALERT CARD at check-in  to the Emergency Department and triage nurse.  Should you have questions after your visit or need to cancel or reschedule your appointment, please contact [place] [facility]  Dept: [phone]  and follow the prompts.  Office hours are 8:00 a.m. to 4:30 p.m. Monday - Friday. Please note that voicemails left after 4:00 p.m. may not be returned until the following business day.  We are closed weekends and major holidays. You have access to a nurse at all times for urgent questions. Please call the main number to the clinic Dept: [phone] and follow the prompts.   For any non-urgent questions, you may also contact your provider using MyChart. We now offer e-Visits for anyone 39 and older to request care online for non-urgent symptoms. For details visit [email or web address].   Also download the MyChart app! Go to the app store, search "MyChart", open the app, select [place], and log in with your MyChart username and password.[place] [facility]  Discharge Instructions: Thank you for choosing [facility] to provide your oncology and hematology care.   If you have a lab appointment with the Cancer Center, please go directly to the Cancer Center and check in at the registration area.   Wear comfortable clothing and clothing appropriate for easy access to any Portacath or PICC line.   We strive to give you quality time with your provider. You may need to reschedule your appointment if you arrive late (15 or more minutes).  Arriving late affects you and other patients whose appointments are after yours.  Also,  if you miss three or more appointments without notifying the office, you may be dismissed from the clinic at the provider's discretion.      For prescription refill requests, have your pharmacy contact our office and allow 72 hours for refills to be completed.    Today you received the following chemotherapy and/or  immunotherapy agents: Vidaza.      To help prevent nausea and vomiting after your treatment, we encourage you to take your nausea medication as directed.  BELOW ARE SYMPTOMS THAT SHOULD BE REPORTED IMMEDIATELY: *FEVER GREATER THAN 100.4 F (38 C) OR HIGHER *CHILLS OR SWEATING *NAUSEA AND VOMITING THAT IS NOT CONTROLLED WITH YOUR NAUSEA MEDICATION *UNUSUAL SHORTNESS OF BREATH *UNUSUAL BRUISING OR BLEEDING *URINARY PROBLEMS (pain or burning when urinating, or frequent urination) *BOWEL PROBLEMS (unusual diarrhea, constipation, pain near the anus) TENDERNESS IN MOUTH AND THROAT WITH OR WITHOUT PRESENCE OF ULCERS (sore throat, sores in mouth, or a toothache) UNUSUAL RASH, SWELLING OR PAIN  UNUSUAL VAGINAL DISCHARGE OR ITCHING   Items with * indicate a potential emergency and should be followed up as soon as possible or go to the Emergency Department if any problems should occur.  Please show the CHEMOTHERAPY ALERT CARD or IMMUNOTHERAPY ALERT CARD at check-in to the Emergency Department and triage nurse.  Should you have questions after your visit or need to cancel or reschedule your appointment, please contact [place] [facility]  Dept: [phone]  and follow the prompts.  Office hours are 8:00 a.m. to 4:30 p.m. Monday - Friday. Please note that voicemails left after 4:00 p.m. may not be returned until the following business day.  We are closed weekends and major holidays. You have access to a nurse at all times for urgent questions. Please call the main number to the clinic Dept: [phone] and follow the prompts.   For any non-urgent questions, you may also contact your provider using MyChart. We now offer e-Visits for anyone 75 and older to request care online for non-urgent symptoms. For details visit [email or web address].   Also download the MyChart app! Go to the app store, search "MyChart", open the app, select [place], and log in with your MyChart  username and password.

## 2023-02-14 NOTE — Assessment & Plan Note (Signed)
-  CT scan April 2024 showed a mild cirrhosis, new since 2022.  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

## 2023-02-15 ENCOUNTER — Inpatient Hospital Stay: Payer: Medicare HMO

## 2023-02-15 ENCOUNTER — Encounter: Payer: Self-pay | Admitting: Hematology

## 2023-02-15 ENCOUNTER — Telehealth: Payer: Self-pay

## 2023-02-15 DIAGNOSIS — I959 Hypotension, unspecified: Secondary | ICD-10-CM | POA: Diagnosis not present

## 2023-02-15 DIAGNOSIS — Z7401 Bed confinement status: Secondary | ICD-10-CM | POA: Diagnosis not present

## 2023-02-15 DIAGNOSIS — S0083XA Contusion of other part of head, initial encounter: Secondary | ICD-10-CM | POA: Diagnosis not present

## 2023-02-15 DIAGNOSIS — M25562 Pain in left knee: Secondary | ICD-10-CM | POA: Diagnosis not present

## 2023-02-15 DIAGNOSIS — Z9181 History of falling: Secondary | ICD-10-CM | POA: Diagnosis not present

## 2023-02-15 DIAGNOSIS — R188 Other ascites: Secondary | ICD-10-CM | POA: Diagnosis not present

## 2023-02-15 DIAGNOSIS — R531 Weakness: Secondary | ICD-10-CM | POA: Diagnosis not present

## 2023-02-15 DIAGNOSIS — M542 Cervicalgia: Secondary | ICD-10-CM | POA: Diagnosis not present

## 2023-02-15 DIAGNOSIS — R0902 Hypoxemia: Secondary | ICD-10-CM | POA: Diagnosis not present

## 2023-02-15 DIAGNOSIS — W1830XA Fall on same level, unspecified, initial encounter: Secondary | ICD-10-CM | POA: Diagnosis not present

## 2023-02-15 DIAGNOSIS — W010XXA Fall on same level from slipping, tripping and stumbling without subsequent striking against object, initial encounter: Secondary | ICD-10-CM | POA: Diagnosis not present

## 2023-02-15 DIAGNOSIS — S199XXA Unspecified injury of neck, initial encounter: Secondary | ICD-10-CM | POA: Diagnosis not present

## 2023-02-15 DIAGNOSIS — R918 Other nonspecific abnormal finding of lung field: Secondary | ICD-10-CM | POA: Diagnosis not present

## 2023-02-15 DIAGNOSIS — K7469 Other cirrhosis of liver: Secondary | ICD-10-CM | POA: Diagnosis not present

## 2023-02-15 DIAGNOSIS — R109 Unspecified abdominal pain: Secondary | ICD-10-CM | POA: Diagnosis not present

## 2023-02-15 DIAGNOSIS — W19XXXA Unspecified fall, initial encounter: Secondary | ICD-10-CM | POA: Diagnosis not present

## 2023-02-15 DIAGNOSIS — R739 Hyperglycemia, unspecified: Secondary | ICD-10-CM | POA: Diagnosis not present

## 2023-02-15 DIAGNOSIS — S36039A Unspecified laceration of spleen, initial encounter: Secondary | ICD-10-CM | POA: Diagnosis not present

## 2023-02-15 DIAGNOSIS — S36029A Unspecified contusion of spleen, initial encounter: Secondary | ICD-10-CM | POA: Diagnosis not present

## 2023-02-15 DIAGNOSIS — M546 Pain in thoracic spine: Secondary | ICD-10-CM | POA: Diagnosis not present

## 2023-02-15 DIAGNOSIS — D6959 Other secondary thrombocytopenia: Secondary | ICD-10-CM | POA: Diagnosis not present

## 2023-02-15 DIAGNOSIS — R4182 Altered mental status, unspecified: Secondary | ICD-10-CM | POA: Diagnosis not present

## 2023-02-15 DIAGNOSIS — I10 Essential (primary) hypertension: Secondary | ICD-10-CM | POA: Diagnosis not present

## 2023-02-15 DIAGNOSIS — S0990XA Unspecified injury of head, initial encounter: Secondary | ICD-10-CM | POA: Diagnosis not present

## 2023-02-15 DIAGNOSIS — R768 Other specified abnormal immunological findings in serum: Secondary | ICD-10-CM | POA: Diagnosis not present

## 2023-02-15 DIAGNOSIS — C9590 Leukemia, unspecified not having achieved remission: Secondary | ICD-10-CM | POA: Diagnosis not present

## 2023-02-15 DIAGNOSIS — Z043 Encounter for examination and observation following other accident: Secondary | ICD-10-CM | POA: Diagnosis not present

## 2023-02-15 DIAGNOSIS — S8992XA Unspecified injury of left lower leg, initial encounter: Secondary | ICD-10-CM | POA: Diagnosis not present

## 2023-02-15 NOTE — Telephone Encounter (Signed)
Pt's daughter Raynelle Fanning called stating pt fell this morning and was taken to the hospital in [place], [place].  Raynelle Fanning stated pt would not be at the appts scheduled for today, tomorrow, and most likely the rest of the week.  Raynelle Fanning was tearful and stated she's unable to continue taking care of the pt.  Raynelle Fanning stated the pt will need to go to a Longterm Care Facility but Raynelle Fanning stated she doesn't know how to make that happen.  Instructed Raynelle Fanning to speak with the [facility] Case Manager who is assigned to the pt regarding d/c pt to a Longterm Care Facility.  Stated the Case Manager will be able to assist the pt in getting into a Longterm Care Facility.  Raynelle Fanning verbalized understanding and was very Thankful.  Raynelle Fanning also requested if Dr. Mosetta Putt could please give her a call regarding this pt.  Stated this nurse will make Dr. Mosetta Putt aware of her request.

## 2023-02-16 ENCOUNTER — Other Ambulatory Visit: Payer: Self-pay

## 2023-02-16 ENCOUNTER — Inpatient Hospital Stay: Payer: Medicare HMO

## 2023-02-16 DIAGNOSIS — K921 Melena: Secondary | ICD-10-CM | POA: Diagnosis not present

## 2023-02-16 DIAGNOSIS — D735 Infarction of spleen: Secondary | ICD-10-CM | POA: Diagnosis not present

## 2023-02-16 DIAGNOSIS — R278 Other lack of coordination: Secondary | ICD-10-CM | POA: Diagnosis not present

## 2023-02-16 DIAGNOSIS — Z95828 Presence of other vascular implants and grafts: Secondary | ICD-10-CM | POA: Diagnosis not present

## 2023-02-16 DIAGNOSIS — R768 Other specified abnormal immunological findings in serum: Secondary | ICD-10-CM | POA: Diagnosis not present

## 2023-02-16 DIAGNOSIS — S32018D Other fracture of first lumbar vertebra, subsequent encounter for fracture with routine healing: Secondary | ICD-10-CM | POA: Diagnosis not present

## 2023-02-16 DIAGNOSIS — R000 Tachycardia, unspecified: Secondary | ICD-10-CM | POA: Diagnosis not present

## 2023-02-16 DIAGNOSIS — S72115A Nondisplaced fracture of greater trochanter of left femur, initial encounter for closed fracture: Secondary | ICD-10-CM | POA: Diagnosis not present

## 2023-02-16 DIAGNOSIS — K644 Residual hemorrhoidal skin tags: Secondary | ICD-10-CM | POA: Diagnosis not present

## 2023-02-16 DIAGNOSIS — R296 Repeated falls: Secondary | ICD-10-CM | POA: Diagnosis not present

## 2023-02-16 DIAGNOSIS — S32110A Nondisplaced Zone I fracture of sacrum, initial encounter for closed fracture: Secondary | ICD-10-CM | POA: Diagnosis not present

## 2023-02-16 DIAGNOSIS — K766 Portal hypertension: Secondary | ICD-10-CM | POA: Diagnosis not present

## 2023-02-16 DIAGNOSIS — R188 Other ascites: Secondary | ICD-10-CM | POA: Diagnosis not present

## 2023-02-16 DIAGNOSIS — R7881 Bacteremia: Secondary | ICD-10-CM | POA: Diagnosis not present

## 2023-02-16 DIAGNOSIS — I350 Nonrheumatic aortic (valve) stenosis: Secondary | ICD-10-CM | POA: Diagnosis not present

## 2023-02-16 DIAGNOSIS — M25552 Pain in left hip: Secondary | ICD-10-CM | POA: Diagnosis not present

## 2023-02-16 DIAGNOSIS — S32028D Other fracture of second lumbar vertebra, subsequent encounter for fracture with routine healing: Secondary | ICD-10-CM | POA: Diagnosis not present

## 2023-02-16 DIAGNOSIS — I700 Atherosclerosis of aorta: Secondary | ICD-10-CM | POA: Diagnosis not present

## 2023-02-16 DIAGNOSIS — K623 Rectal prolapse: Secondary | ICD-10-CM | POA: Diagnosis not present

## 2023-02-16 DIAGNOSIS — G928 Other toxic encephalopathy: Secondary | ICD-10-CM | POA: Diagnosis not present

## 2023-02-16 DIAGNOSIS — W19XXXA Unspecified fall, initial encounter: Secondary | ICD-10-CM | POA: Diagnosis not present

## 2023-02-16 DIAGNOSIS — E1142 Type 2 diabetes mellitus with diabetic polyneuropathy: Secondary | ICD-10-CM | POA: Diagnosis not present

## 2023-02-16 DIAGNOSIS — S32048D Other fracture of fourth lumbar vertebra, subsequent encounter for fracture with routine healing: Secondary | ICD-10-CM | POA: Diagnosis not present

## 2023-02-16 DIAGNOSIS — B957 Other staphylococcus as the cause of diseases classified elsewhere: Secondary | ICD-10-CM | POA: Diagnosis not present

## 2023-02-16 DIAGNOSIS — C9310 Chronic myelomonocytic leukemia not having achieved remission: Secondary | ICD-10-CM | POA: Diagnosis not present

## 2023-02-16 DIAGNOSIS — I10 Essential (primary) hypertension: Secondary | ICD-10-CM | POA: Diagnosis not present

## 2023-02-16 DIAGNOSIS — D689 Coagulation defect, unspecified: Secondary | ICD-10-CM | POA: Diagnosis not present

## 2023-02-16 DIAGNOSIS — K7469 Other cirrhosis of liver: Secondary | ICD-10-CM | POA: Diagnosis not present

## 2023-02-16 DIAGNOSIS — K7682 Hepatic encephalopathy: Secondary | ICD-10-CM | POA: Diagnosis not present

## 2023-02-16 DIAGNOSIS — R140 Abdominal distension (gaseous): Secondary | ICD-10-CM | POA: Diagnosis not present

## 2023-02-16 DIAGNOSIS — F32A Depression, unspecified: Secondary | ICD-10-CM | POA: Diagnosis not present

## 2023-02-16 DIAGNOSIS — K7460 Unspecified cirrhosis of liver: Secondary | ICD-10-CM | POA: Diagnosis not present

## 2023-02-16 DIAGNOSIS — N3000 Acute cystitis without hematuria: Secondary | ICD-10-CM | POA: Diagnosis not present

## 2023-02-17 ENCOUNTER — Inpatient Hospital Stay: Payer: Medicare HMO

## 2023-02-20 ENCOUNTER — Inpatient Hospital Stay: Payer: Medicare HMO

## 2023-02-21 ENCOUNTER — Inpatient Hospital Stay (HOSPITAL_COMMUNITY): Admission: RE | Admit: 2023-02-21 | Payer: Medicare HMO | Source: Ambulatory Visit

## 2023-02-22 ENCOUNTER — Inpatient Hospital Stay: Payer: Medicare HMO

## 2023-02-22 ENCOUNTER — Encounter: Payer: Medicare HMO | Admitting: Family Medicine

## 2023-02-22 NOTE — Progress Notes (Signed)
   Pt scheduled for hosp. Follow up today-Pt is currently admitted at [facility] has had a readmission

## 2023-02-23 ENCOUNTER — Other Ambulatory Visit: Payer: Self-pay | Admitting: Family Medicine

## 2023-02-23 ENCOUNTER — Telehealth: Payer: Self-pay | Admitting: Family Medicine

## 2023-02-23 NOTE — Telephone Encounter (Signed)
Pt will need follow up with PCP prior to any orders for SNF placement.

## 2023-02-23 NOTE — Telephone Encounter (Signed)
Kathleen Gibson daughter Kathleen Gibson called to update Dr. Claiborne Billings on her medical status. She is currently in the hospital due to a recent fall which led to broken ribs. She is expected to be discharged within the next couple of days. Once discharged she will be moving into a rehab facility for at least 10 days, and then Kathleen Fanning is unsure where her mother will go, and that is what she is seeking advice on. She was told by the case manager to contact Dr. Alan Ripper office to get her mother set up to live in a skilled nursing facility. I informed Kathleen Fanning she would need an appointment, but at the time of the call scheduling was uncertain due to dates of rehab and  and currently being admitted in the hospital. Please give Kathleen Fanning a call to discuss information on how to get started with moving her mother into a skilled nursing facility at [phone]

## 2023-02-24 NOTE — Telephone Encounter (Signed)
Pt is schedule for 10/29

## 2023-02-28 DIAGNOSIS — Z792 Long term (current) use of antibiotics: Secondary | ICD-10-CM | POA: Diagnosis not present

## 2023-02-28 DIAGNOSIS — K219 Gastro-esophageal reflux disease without esophagitis: Secondary | ICD-10-CM | POA: Diagnosis not present

## 2023-02-28 DIAGNOSIS — Z95828 Presence of other vascular implants and grafts: Secondary | ICD-10-CM | POA: Diagnosis not present

## 2023-02-28 DIAGNOSIS — R2681 Unsteadiness on feet: Secondary | ICD-10-CM | POA: Diagnosis not present

## 2023-02-28 DIAGNOSIS — C9310 Chronic myelomonocytic leukemia not having achieved remission: Secondary | ICD-10-CM | POA: Diagnosis not present

## 2023-02-28 DIAGNOSIS — S72112D Displaced fracture of greater trochanter of left femur, subsequent encounter for closed fracture with routine healing: Secondary | ICD-10-CM | POA: Diagnosis not present

## 2023-02-28 DIAGNOSIS — R188 Other ascites: Secondary | ICD-10-CM | POA: Diagnosis not present

## 2023-02-28 DIAGNOSIS — S72115D Nondisplaced fracture of greater trochanter of left femur, subsequent encounter for closed fracture with routine healing: Secondary | ICD-10-CM | POA: Diagnosis not present

## 2023-02-28 DIAGNOSIS — M8000XD Age-related osteoporosis with current pathological fracture, unspecified site, subsequent encounter for fracture with routine healing: Secondary | ICD-10-CM | POA: Diagnosis not present

## 2023-02-28 DIAGNOSIS — Z9181 History of falling: Secondary | ICD-10-CM | POA: Diagnosis not present

## 2023-02-28 DIAGNOSIS — G8929 Other chronic pain: Secondary | ICD-10-CM | POA: Diagnosis not present

## 2023-02-28 DIAGNOSIS — E611 Iron deficiency: Secondary | ICD-10-CM | POA: Diagnosis not present

## 2023-02-28 DIAGNOSIS — D696 Thrombocytopenia, unspecified: Secondary | ICD-10-CM | POA: Diagnosis not present

## 2023-02-28 DIAGNOSIS — M6281 Muscle weakness (generalized): Secondary | ICD-10-CM | POA: Diagnosis not present

## 2023-02-28 DIAGNOSIS — B957 Other staphylococcus as the cause of diseases classified elsewhere: Secondary | ICD-10-CM | POA: Diagnosis not present

## 2023-02-28 DIAGNOSIS — R262 Difficulty in walking, not elsewhere classified: Secondary | ICD-10-CM | POA: Diagnosis not present

## 2023-02-28 DIAGNOSIS — K7290 Hepatic failure, unspecified without coma: Secondary | ICD-10-CM | POA: Diagnosis not present

## 2023-02-28 DIAGNOSIS — S32000A Wedge compression fracture of unspecified lumbar vertebra, initial encounter for closed fracture: Secondary | ICD-10-CM | POA: Diagnosis not present

## 2023-02-28 DIAGNOSIS — K7469 Other cirrhosis of liver: Secondary | ICD-10-CM | POA: Diagnosis not present

## 2023-02-28 DIAGNOSIS — W19XXXA Unspecified fall, initial encounter: Secondary | ICD-10-CM | POA: Diagnosis not present

## 2023-02-28 DIAGNOSIS — K7460 Unspecified cirrhosis of liver: Secondary | ICD-10-CM | POA: Diagnosis not present

## 2023-02-28 DIAGNOSIS — R609 Edema, unspecified: Secondary | ICD-10-CM | POA: Diagnosis not present

## 2023-02-28 DIAGNOSIS — K7682 Hepatic encephalopathy: Secondary | ICD-10-CM | POA: Diagnosis not present

## 2023-02-28 DIAGNOSIS — C9590 Leukemia, unspecified not having achieved remission: Secondary | ICD-10-CM | POA: Diagnosis not present

## 2023-02-28 DIAGNOSIS — R161 Splenomegaly, not elsewhere classified: Secondary | ICD-10-CM | POA: Diagnosis not present

## 2023-02-28 DIAGNOSIS — L298 Other pruritus: Secondary | ICD-10-CM | POA: Diagnosis not present

## 2023-02-28 DIAGNOSIS — E860 Dehydration: Secondary | ICD-10-CM | POA: Diagnosis not present

## 2023-02-28 DIAGNOSIS — R110 Nausea: Secondary | ICD-10-CM | POA: Diagnosis not present

## 2023-02-28 DIAGNOSIS — R278 Other lack of coordination: Secondary | ICD-10-CM | POA: Diagnosis not present

## 2023-02-28 DIAGNOSIS — R768 Other specified abnormal immunological findings in serum: Secondary | ICD-10-CM | POA: Diagnosis not present

## 2023-02-28 DIAGNOSIS — M109 Gout, unspecified: Secondary | ICD-10-CM | POA: Diagnosis not present

## 2023-02-28 DIAGNOSIS — K648 Other hemorrhoids: Secondary | ICD-10-CM | POA: Diagnosis not present

## 2023-02-28 NOTE — Telephone Encounter (Signed)
We can attempt a virtual appointment, however it sounds like she needs an FL 2 form completed.  Which means she needs to obtain an FL 2 form from the skilled nursing facility she has been placed prior to the appointment.

## 2023-02-28 NOTE — Telephone Encounter (Signed)
Called Raynelle Fanning to see if she can do virtual visit at 1:20 pm on  09/20. If virtual is scheduled, what number will we be using for pt to connect?

## 2023-03-01 ENCOUNTER — Inpatient Hospital Stay: Payer: Medicare HMO

## 2023-03-01 ENCOUNTER — Telehealth: Payer: Self-pay

## 2023-03-01 ENCOUNTER — Other Ambulatory Visit: Payer: Self-pay

## 2023-03-01 ENCOUNTER — Telehealth: Payer: Self-pay | Admitting: Family Medicine

## 2023-03-01 ENCOUNTER — Inpatient Hospital Stay: Payer: Medicare HMO | Admitting: Hematology

## 2023-03-01 NOTE — Telephone Encounter (Signed)
See other encounter.

## 2023-03-01 NOTE — Telephone Encounter (Signed)
Per PCP, Kathleen Gibson will need to see if pt has a Child psychotherapist already. SNF placement is usually done in hospital or at rehab location. If Kathleen Gibson has a location that she knows she would like pt to go to after rehab, we can do a FL2 for her. If pt does not have Child psychotherapist, we will need to get her in contact with one for placement.

## 2023-03-01 NOTE — Telephone Encounter (Signed)
Andrena Mews E1 hour ago (9:14 AM)   IM Patient's daughter Raynelle Fanning called back to scheduled the virtual visit for 9/20 however that was taken at the time of the call. I have scheduled for 9/24 for virtual and also confirmed which number to call which is Julies at [phone].

## 2023-03-01 NOTE — Telephone Encounter (Signed)
Spoke with pt's daughter Kathleen Gibson regarding pt's appts.  Kathleen Gibson stated the pt is in a rehabilitation center in [place] and currently getting her tx at [facility].  Kathleen Gibson stated the once the pt is d/c from rehab they are trying to have the pt admitted to a Long-term Care Facility in [place] so the pt could continue to be under Dr. Latanya Maudlin care.  Kathleen Gibson stated she's currently not sure as to when the pt will be d/c from Rehab but will f/u with Dr. Latanya Maudlin office once she has a date.  Kathleen Gibson will send Dr. Latanya Maudlin office MyChart messages regarding pt's scheduled appts as to whether pt will be at the appts or not.  This nurse agreed with Julie's plan and stated I will update Dr. Mosetta Putt on the pt's situation.  Kathleen Gibson had no further questions at this time.

## 2023-03-01 NOTE — Telephone Encounter (Signed)
Spoke with patient regarding results/recommendations.   Spoke with Raynelle Fanning and advised her of recommendations. Will contact for update soon

## 2023-03-01 NOTE — Telephone Encounter (Signed)
Patient's daughter Raynelle Fanning called back to scheduled the virtual visit for 9/20 however that was taken at the time of the call. I have scheduled for 9/24 for virtual and also confirmed which number to call which is Julies at [phone].

## 2023-03-02 DIAGNOSIS — K7460 Unspecified cirrhosis of liver: Secondary | ICD-10-CM | POA: Diagnosis not present

## 2023-03-02 DIAGNOSIS — C9310 Chronic myelomonocytic leukemia not having achieved remission: Secondary | ICD-10-CM | POA: Diagnosis not present

## 2023-03-02 DIAGNOSIS — K7682 Hepatic encephalopathy: Secondary | ICD-10-CM | POA: Diagnosis not present

## 2023-03-02 DIAGNOSIS — K7290 Hepatic failure, unspecified without coma: Secondary | ICD-10-CM | POA: Diagnosis not present

## 2023-03-05 DIAGNOSIS — M8000XD Age-related osteoporosis with current pathological fracture, unspecified site, subsequent encounter for fracture with routine healing: Secondary | ICD-10-CM | POA: Diagnosis not present

## 2023-03-05 DIAGNOSIS — K7469 Other cirrhosis of liver: Secondary | ICD-10-CM | POA: Diagnosis not present

## 2023-03-05 DIAGNOSIS — D696 Thrombocytopenia, unspecified: Secondary | ICD-10-CM | POA: Diagnosis not present

## 2023-03-05 DIAGNOSIS — C9310 Chronic myelomonocytic leukemia not having achieved remission: Secondary | ICD-10-CM | POA: Diagnosis not present

## 2023-03-05 DIAGNOSIS — S72115D Nondisplaced fracture of greater trochanter of left femur, subsequent encounter for closed fracture with routine healing: Secondary | ICD-10-CM | POA: Diagnosis not present

## 2023-03-06 ENCOUNTER — Ambulatory Visit: Payer: Medicare HMO

## 2023-03-06 ENCOUNTER — Other Ambulatory Visit: Payer: Medicare HMO

## 2023-03-06 ENCOUNTER — Ambulatory Visit: Payer: Medicare HMO | Admitting: Hematology

## 2023-03-06 DIAGNOSIS — E860 Dehydration: Secondary | ICD-10-CM | POA: Diagnosis not present

## 2023-03-06 DIAGNOSIS — C9310 Chronic myelomonocytic leukemia not having achieved remission: Secondary | ICD-10-CM | POA: Diagnosis not present

## 2023-03-06 DIAGNOSIS — R768 Other specified abnormal immunological findings in serum: Secondary | ICD-10-CM | POA: Diagnosis not present

## 2023-03-07 ENCOUNTER — Other Ambulatory Visit: Payer: Self-pay

## 2023-03-07 ENCOUNTER — Ambulatory Visit: Payer: Medicare HMO

## 2023-03-07 ENCOUNTER — Encounter: Payer: Medicare HMO | Admitting: Family Medicine

## 2023-03-07 DIAGNOSIS — S32000A Wedge compression fracture of unspecified lumbar vertebra, initial encounter for closed fracture: Secondary | ICD-10-CM | POA: Diagnosis not present

## 2023-03-07 DIAGNOSIS — D696 Thrombocytopenia, unspecified: Secondary | ICD-10-CM

## 2023-03-07 DIAGNOSIS — C9310 Chronic myelomonocytic leukemia not having achieved remission: Secondary | ICD-10-CM

## 2023-03-07 DIAGNOSIS — S72112D Displaced fracture of greater trochanter of left femur, subsequent encounter for closed fracture with routine healing: Secondary | ICD-10-CM | POA: Diagnosis not present

## 2023-03-07 NOTE — Progress Notes (Signed)
err

## 2023-03-08 ENCOUNTER — Inpatient Hospital Stay: Payer: Medicare HMO

## 2023-03-08 ENCOUNTER — Inpatient Hospital Stay: Payer: Medicare HMO | Admitting: Hematology

## 2023-03-08 ENCOUNTER — Ambulatory Visit: Payer: Medicare HMO

## 2023-03-08 NOTE — Assessment & Plan Note (Deleted)
-  diagnosed in 10/2020, presented with severe thrombocytopenia  --She began azacitidine 75mg /m2 daily days 1-5 q. 28 days started on 12/28/20 -She had worsening thrombocytopenia after stopping Nplate, so it was restarted and will continue weekly  -Bone marrow biopsy 03/18/21 showed stable disease, no increase in blasts. -she is tolerating treatment well, blood counts are overall stable  -In 09/2022, she received a decreased dose of Azacitidine at 60 mg/m2 due to worsening thrombocytopenia, back on full dose in May, thrombocytopenia has improved, she is tolerating it well. -she has been following Dr. Sharyne Richters at [facility] also  -Zometa has been held due to her dental issue -her overall status has declined recently, was hospitalized twice in August and September 2024

## 2023-03-09 ENCOUNTER — Ambulatory Visit: Payer: Medicare HMO

## 2023-03-09 DIAGNOSIS — C9310 Chronic myelomonocytic leukemia not having achieved remission: Secondary | ICD-10-CM | POA: Diagnosis not present

## 2023-03-10 ENCOUNTER — Telehealth: Payer: Self-pay | Admitting: Hematology

## 2023-03-10 ENCOUNTER — Other Ambulatory Visit: Payer: Self-pay | Admitting: Hematology

## 2023-03-10 ENCOUNTER — Ambulatory Visit: Payer: Medicare HMO

## 2023-03-10 ENCOUNTER — Other Ambulatory Visit: Payer: Self-pay

## 2023-03-10 ENCOUNTER — Telehealth: Payer: Self-pay

## 2023-03-10 DIAGNOSIS — R161 Splenomegaly, not elsewhere classified: Secondary | ICD-10-CM | POA: Diagnosis not present

## 2023-03-10 DIAGNOSIS — C9310 Chronic myelomonocytic leukemia not having achieved remission: Secondary | ICD-10-CM | POA: Diagnosis not present

## 2023-03-10 DIAGNOSIS — R2681 Unsteadiness on feet: Secondary | ICD-10-CM | POA: Diagnosis not present

## 2023-03-10 DIAGNOSIS — M6281 Muscle weakness (generalized): Secondary | ICD-10-CM | POA: Diagnosis not present

## 2023-03-10 DIAGNOSIS — K648 Other hemorrhoids: Secondary | ICD-10-CM | POA: Diagnosis not present

## 2023-03-10 DIAGNOSIS — R188 Other ascites: Secondary | ICD-10-CM | POA: Diagnosis not present

## 2023-03-10 DIAGNOSIS — R262 Difficulty in walking, not elsewhere classified: Secondary | ICD-10-CM | POA: Diagnosis not present

## 2023-03-10 DIAGNOSIS — R278 Other lack of coordination: Secondary | ICD-10-CM | POA: Diagnosis not present

## 2023-03-10 DIAGNOSIS — K7469 Other cirrhosis of liver: Secondary | ICD-10-CM | POA: Diagnosis not present

## 2023-03-10 DIAGNOSIS — S72115D Nondisplaced fracture of greater trochanter of left femur, subsequent encounter for closed fracture with routine healing: Secondary | ICD-10-CM | POA: Diagnosis not present

## 2023-03-10 DIAGNOSIS — D696 Thrombocytopenia, unspecified: Secondary | ICD-10-CM | POA: Diagnosis not present

## 2023-03-10 MED FILL — Dexamethasone Sodium Phosphate Inj 100 MG/10ML: INTRAMUSCULAR | Qty: 1 | Status: AC

## 2023-03-10 NOTE — Telephone Encounter (Signed)
Spoke with pt's daughter Kathleen Gibson and Dr. Brown Human PA Kathleen Gibson) at [facility].  Kathleen Gibson stated the pt is being d/c from the rehabilitation center in [place] today and will be coming to [facility] on next week.  Pt is currently scheduled at [facility] w/Dr. Sharyne Gibson for next week (wk of 03/13/2023).  Spoke with Kathleen Finlay PA-C for Dr. Sharyne Gibson w/[facility] and Kathleen Gibson stated that since pt will be d/c today pt will need to be seen by Dr. Mosetta Gibson the week of 03/13/2023 but Dr. Sharyne Gibson does not want the pt to restart Azacitidine until the week of 03/20/2023.  Dr. Mosetta Gibson agreed with plan an updated pt's treatment plan.  Pt will get labs, OV, blood transfusion (If needed) on 03/13/2023, on 03/15/2023 pt will get labs & Nplate injection.  Pt will get chemo on the wk of 03/20/2023.  Scheduling message sent to Dr. Latanya Gibson Scheduler to schedule pt's appts.  Pt's daughter is aware of the plan as well as Dr. Brown Human office.

## 2023-03-12 NOTE — Assessment & Plan Note (Signed)
-  diagnosed in 10/2020, presented with severe thrombocytopenia  --She began azacitidine 75mg /m2 daily days 1-5 q. 28 days started on 12/28/20 -She had worsening thrombocytopenia after stopping Nplate, so it was restarted and will continue weekly  -Bone marrow biopsy 03/18/21 showed stable disease, no increase in blasts. -she is tolerating treatment well, blood counts are overall stable  -In 09/2022, she received a decreased dose of Azacitidine at 60 mg/m2 due to worsening thrombocytopenia, back on full dose in May, thrombocytopenia has improved, she is tolerating it well. -she has been following Dr. Sharyne Richters at [facility] also  -Zometa has been held due to her dental issue -her overall status has declined recently, was hospitalized twice in August and September 2024

## 2023-03-13 ENCOUNTER — Other Ambulatory Visit: Payer: Self-pay

## 2023-03-13 ENCOUNTER — Telehealth: Payer: Self-pay

## 2023-03-13 ENCOUNTER — Other Ambulatory Visit: Payer: Medicare HMO

## 2023-03-13 ENCOUNTER — Ambulatory Visit: Payer: Medicare HMO | Admitting: Hematology

## 2023-03-13 ENCOUNTER — Inpatient Hospital Stay: Payer: Medicare HMO

## 2023-03-13 ENCOUNTER — Inpatient Hospital Stay (HOSPITAL_BASED_OUTPATIENT_CLINIC_OR_DEPARTMENT_OTHER): Payer: Medicare HMO | Admitting: Hematology

## 2023-03-13 ENCOUNTER — Inpatient Hospital Stay: Payer: Medicare HMO | Admitting: Hematology

## 2023-03-13 VITALS — BP 106/57 | HR 105 | Temp 99.4°F | Resp 17 | Ht 66.0 in | Wt 144.9 lb

## 2023-03-13 DIAGNOSIS — D696 Thrombocytopenia, unspecified: Secondary | ICD-10-CM

## 2023-03-13 DIAGNOSIS — K7460 Unspecified cirrhosis of liver: Secondary | ICD-10-CM

## 2023-03-13 DIAGNOSIS — C9310 Chronic myelomonocytic leukemia not having achieved remission: Secondary | ICD-10-CM

## 2023-03-13 DIAGNOSIS — R188 Other ascites: Secondary | ICD-10-CM | POA: Diagnosis not present

## 2023-03-13 DIAGNOSIS — E538 Deficiency of other specified B group vitamins: Secondary | ICD-10-CM

## 2023-03-13 DIAGNOSIS — Z95828 Presence of other vascular implants and grafts: Secondary | ICD-10-CM

## 2023-03-13 LAB — VITAMIN B12: Vitamin B-12: 2349 pg/mL — ABNORMAL HIGH (ref 180–914)

## 2023-03-13 LAB — CBC WITH DIFFERENTIAL (CANCER CENTER ONLY)
Abs Immature Granulocytes: 0.64 10*3/uL — ABNORMAL HIGH (ref 0.00–0.07)
Basophils Absolute: 0 10*3/uL (ref 0.0–0.1)
Basophils Relative: 0 %
Eosinophils Absolute: 0 10*3/uL (ref 0.0–0.5)
Eosinophils Relative: 0 %
HCT: 24.7 % — ABNORMAL LOW (ref 36.0–46.0)
Hemoglobin: 8.6 g/dL — ABNORMAL LOW (ref 12.0–15.0)
Immature Granulocytes: 4 %
Lymphocytes Relative: 4 %
Lymphs Abs: 0.5 10*3/uL — ABNORMAL LOW (ref 0.7–4.0)
MCH: 32.8 pg (ref 26.0–34.0)
MCHC: 34.8 g/dL (ref 30.0–36.0)
MCV: 94.3 fL (ref 80.0–100.0)
Monocytes Absolute: 2.6 10*3/uL — ABNORMAL HIGH (ref 0.1–1.0)
Monocytes Relative: 17 %
Neutro Abs: 11.1 10*3/uL — ABNORMAL HIGH (ref 1.7–7.7)
Neutrophils Relative %: 75 %
Platelet Count: 54 10*3/uL — ABNORMAL LOW (ref 150–400)
RBC: 2.62 MIL/uL — ABNORMAL LOW (ref 3.87–5.11)
RDW: 21.1 % — ABNORMAL HIGH (ref 11.5–15.5)
WBC Count: 14.9 10*3/uL — ABNORMAL HIGH (ref 4.0–10.5)
nRBC: 0 % (ref 0.0–0.2)

## 2023-03-13 LAB — COMPREHENSIVE METABOLIC PANEL
ALT: 13 U/L (ref 0–44)
AST: 18 U/L (ref 15–41)
Albumin: 2.7 g/dL — ABNORMAL LOW (ref 3.5–5.0)
Alkaline Phosphatase: 181 U/L — ABNORMAL HIGH (ref 38–126)
Anion gap: 6 (ref 5–15)
BUN: 21 mg/dL (ref 8–23)
CO2: 24 mmol/L (ref 22–32)
Calcium: 7.8 mg/dL — ABNORMAL LOW (ref 8.9–10.3)
Chloride: 106 mmol/L (ref 98–111)
Creatinine, Ser: 1.12 mg/dL — ABNORMAL HIGH (ref 0.44–1.00)
GFR, Estimated: 53 mL/min — ABNORMAL LOW (ref >60)
Glucose, Bld: 170 mg/dL — ABNORMAL HIGH (ref 70–99)
Potassium: 4.1 mmol/L (ref 3.5–5.1)
Sodium: 136 mmol/L (ref 135–145)
Total Bilirubin: 2.8 mg/dL — ABNORMAL HIGH (ref 0.3–1.2)
Total Protein: 6.5 g/dL (ref 6.5–8.1)

## 2023-03-13 LAB — SAMPLE TO BLOOD BANK

## 2023-03-13 LAB — FERRITIN: Ferritin: 168 ng/mL (ref 11–307)

## 2023-03-13 MED ORDER — SODIUM CHLORIDE 0.9% FLUSH
10.0000 mL | Freq: Once | INTRAVENOUS | Status: AC
  Administered 2023-03-13: 10 mL

## 2023-03-13 MED ORDER — SODIUM CHLORIDE 0.9 % IV SOLN: INTRAVENOUS | Status: DC

## 2023-03-13 NOTE — Telephone Encounter (Signed)
Spoke with pt's daughter Raynelle Fanning regarding paracentesis appt scheduled on 03/15/2023 w/[facility].  Stated that appt is scheduled at 9:30am at Entrance A at [facility].  Pt's daughter confirmed appt.  Pt will come to [facility] once finished at [facility] on 03/15/2023 for lab and Nplate injection.  Informed Raynelle Fanning that Dr. Mosetta Putt would like to speak with both pt and daughter if possible at pt's 03/20/2023 appt scheduled with Santiago Glad, NP. Raynelle Fanning stated that she is not able to come d/t she will be out of town at a work conference but is available by telephone for the visit.  Raynelle Fanning asked if Dr. Mosetta Putt could call her during the OV appt.  Stated this nurse will make Dr. Mosetta Putt aware and will update the appt note to state call pt's daughter Raynelle Fanning.  Raynelle Fanning verbalized understanding and had no further questions or concerns.

## 2023-03-13 NOTE — Progress Notes (Signed)
[facility]   [phone]) [phone] [phone]   Clinic Follow up Note   Patient Care Team: Natalia Leatherwood, DO as PCP - General (Family Medicine) Myrtie Neither Andreas Blower, MD as Consulting Physician (Gastroenterology) Malachy Mood, MD as Consulting Physician (Hematology) Glendale Chard, DO as Consulting Physician (Neurology)  Date of Service:  03/13/2023  CHIEF COMPLAINT: f/u of [name]  CURRENT THERAPY:  Azacitidine day 1-5 every 28 days  Oncology History   [name] (chronic myelomonocytic leukemia) (HCC) -diagnosed in 10/2020, presented with severe thrombocytopenia  --She began azacitidine 75mg /m2 daily days 1-5 q. 28 days started on 12/28/20 -She had worsening thrombocytopenia after stopping Nplate, so it was restarted and will continue weekly  -Bone marrow biopsy 03/18/21 showed stable disease, no increase in blasts. -she is tolerating treatment well, blood counts are overall stable  -In 09/2022, she received a decreased dose of Azacitidine at 60 mg/m2 due to worsening thrombocytopenia, back on full dose in May, thrombocytopenia has improved, she is tolerating it well. -she has been following Dr. Sharyne Richters at [facility] also  -Zometa has been held due to her dental issue -her overall status has declined recently, was hospitalized twice in August and September 2024   Assessment and Plan    Chronic Myelomonocytic Leukemia ([name]) Patient is weak and overall health is deteriorating. Discussed the risks/benefits of continuing chemotherapy.  I feel the risk is outweighed the benefit at this point due to overall poor health.  We discussed focusing on symptom management and supportive care.  -The patient will discuss with family and revisit the decision next week. -Will discuss further with patient and her family on next visit in a week  Liver Cirrhosis Patient has significant abdominal distension and lower extremity edema, likely secondary to liver disease. Patient has not seen a  hepatologist yet. -Schedule paracentesis to remove excess fluid. -Encourage patient to use Furosemide as needed, monitoring blood pressure.  Multiple falls and fractures Patient has multiple fractures from falls, likely secondary to overall weakness and potential medication side effects. -Continue monitoring patient's condition. -Ensure safe use of walker.  Chronic Pain Patient reports chronic pain, particularly at night, in the mid-back region. -Continue current pain management regimen.  Moderate protein and calorie malnutrition Patient has poor appetite and has lost weight. -Encourage increased intake of nutritional supplements like Ensure or Boost.  General Health Maintenance / Followup Plans -Administer IV fluids today due to signs of dehydration. -Check blood counts next week and administer blood transfusion if needed. -Continue Nplate injection next Monday  -Schedule follow-up appointment for next week to discuss ongoing management and potential discontinuation of chemotherapy. -Consider long-term care facility for better management of patient's overall health.         SUMMARY OF ONCOLOGIC HISTORY: Oncology History  [name] (chronic myelomonocytic leukemia) (HCC)  10/29/2020 Imaging   CT CAP  IMPRESSION: 1. Splenomegaly with pathologically enlarged lymph nodes above and below the diaphragm, with overall stable to minimally increased abdominal adenopathy and interval progression of the pelvic adenopathy. 2. Small volume abdominopelvic ascites with diffuse mesenteric stranding. 3. Scattered bilateral pulmonary micro nodules measuring 1-2 mm. 4. Distended gallbladder with some layering hyperdense material representing layering sludge and tiny stones seen on prior ultrasound. 5. Aortic atherosclerosis.   10/30/2020 Pathology Results   DIAGNOSIS:   BONE MARROW, ASPIRATE, CLOT, CORE:  -  Hypercellular bone marrow with panhyperplasia, atypia, and no  increase in blasts  -   See comment   PERIPHERAL BLOOD:  -  Marked thrombocytopenia  -  Absolute monocytosis  -  Normocytic anemia  -  See CBC data and comment   COMMENT:  The bone marrow is hypercellular for age (approximately 80%) with myeloid hyperplasia with maturational left shift, erythroid hyperplasia, and increased megakaryocytes.  Mild multilineage atypia is present. Blasts are not increased on aspirate smears (1% by manual differential count) or by CD34 immunohistochemistry on the core biopsy.  Concurrent flow cytometric analysis of the bone marrow aspirate demonstrates increased monocytes, and no increase in blasts or abnormal lymphoid population (see [id number]).  Monocytes are also increased in peripheral  blood, persistent per electronic medical record.  In aggregate, the  findings raise the possibility of a myeloid neoplasm with the  differential diagnosis including a low-grade myelodysplastic syndrome and chronic myelomonocytic leukemia (dysplastic type).    ADDENDUM:  A reticulin special stain performed on the bone marrow core biopsy reveals no significant increase in reticulin fibrosis.  ADDENDUM:  CYTOGENETIC RESULTS:  Karyotype: 46,XX[20]  Interpretation: NORMAL FEMALE KARYOTYPE   FISH RESULTS:  Results: NORMAL   ADDENDUM:  CD123 immunohistochemistry performed on the core biopsy highlights scattered aggregates of positively staining cells consistent with plasmacytoid dendritic cells.    10/30/2020 Pathology Results   DIAGNOSIS:   BONE MARROW; FLOW CYTOMETRIC ANALYSIS:  -  Increased monocytes  -  Scant B-cells present  -  No immunophenotypically aberrant T-cell population identified  -  No increase in blasts  -  See comment   COMMENT:  Monocytes are relatively increased (12% of all cells), without aberrant expression of CD56.  B-cells comprise <1% of total lymphocytes. CD34-positive blasts are not increased (<1% of all cells).  Correlation with concurrent morphology is recommended  for complete diagnostic interpretation and overall blast enumeration (see [id number]).    10/30/2020 Pathology Results   FINAL MICROSCOPIC DIAGNOSIS:   A. LYMPH NODE, RIGHT AXILLARY, NEEDLE CORE BIOPSY:  -Lymphoid tissue present  -See comment   COMMENT:  The sections show several small needle core biopsy fragments of lymphoid tissue displaying degenerative cellular changes/necrosis and hence cannot be accurately evaluated.  Sample for flow cytometric analysis not available.  Immunohistochemical stain for CD3 and CD20 were performed with appropriate controls.  There is a mixture of T and B cells in their apparently respective compartments.  There is no definite metastatic malignancy.    11/11/2020 Pathology Results   DIAGNOSIS:   LEFT AXILLARY LYMPH NODE EXCISIONAL BIOPSY; FLOW CYTOMETRIC ANALYSIS:  -  No monotypic B-cell or immunophenotypically aberrant T-cell  population identified  -  See comment   COMMENT:  Flow cytometric analysis identifies B-cells with a normal kappa:lambda ratio of 1.7:1.  A subset of the polytypic B-cells expresses CD10. T-cells show a CD4:CD8 ratio of 3.3:1 without immunophenotypic aberrancy with the markers evaluated.  Although these results do not support the diagnosis of a clonal lymphoid population, sampling issues must always be considered when negative results are obtained, as focal lesions may not be represented in the specimen submitted.  In addition, flow cytometric immunophenotyping will not exclude other pathology if present (e.g. Hodgkin lymphoma, some T-cell lymphomas, metastatic and infectious diseases).   11/11/2020 Pathology Results   FINAL MICROSCOPIC DIAGNOSIS:   A. LYMPH NODE, LEFT AXILLARY, DISSECTION:  -  Follicular hyperplasia with interfollicular expansion  -  See comment   COMMENT:  Sections of the lymph nodes reveal generally preserved lymph node architecture.  There is follicular hyperplasia with some follicles showing increased  hyalinization of germinal centers and mild concentric encircling  of germinal centers with small lymphocytes (onion skinning). Interfollicular areas are expanded with increased vascular proliferation, small lymphocytes, plasma cells, histiocytic cells, and patchy neutrophils.  The lymph node capsule is variably thickened by fibrosis with few plasma cells.   A panel of immunohistochemical stains is performed for further  characterization.  CD3 and CD20/PAX5 highlight T-cell and B-cell  compartments, respectively.  Germinal centers are highlighted by CD10 and BCL6 with appropriate absent expression of BCL2.  CD5 is similar to CD3.  CD43 also stains the T-cells.  CD4-positive T-cells exceed CD8-positive T-cells.  CD30 highlights scattered immunoblasts.  CD21 reveals intact follicular dendritic cell meshworks.  CD68 highlights increased histiocytic cells.  HHV 8 is negative.  T. pallidum reveals no definitive organisms.  Pancytokeratin is negative.  TdT stains rare cells.  CD138 highlights plasma cells which are not increased in number and show polytypic light chain expression by kappa/lambda in situ  hybridization.  EBV is negative by in situ hybridization.   Concurrent flow cytometric analysis is negative for a monoclonal B-cell or immunophenotypically aberrant T-cell population (see [phone]).   Together, the findings above are non-specific and can be seen in  reactive and infectious processes as well as Castleman disease.  There is no definitive morphologic or flow cytometric evidence of involvement by a lymphoproliferative disorder from the current workup.   12/17/2020 Initial Diagnosis   [name] (chronic myelomonocytic leukemia) (HCC)   12/28/2020 - 02/03/2022 Chemotherapy   Patient is on Treatment Plan : MYELODYSPLASIA  Azacitidine IV D1-5 q28d     03/18/2021 Pathology Results   Final Diagnosis    BONE MARROW:             Persistent chronic myelomonocytic leukemia in a hypercellular bone marrow with  increased monocytes and no increase in blasts.   Comment    Flow cytometric analysis showed no increase in blasts and 17% monocytes.A next generation myeloid panel showed the presence of the following mutations: NRAS, SH2B3, PHF6 and TET2. CD34 and CD117 immunstains do not show an increase in blasts. The findings are consistent with persistent [name] with a decrease in the number of blasts compared to that seen on the previous bone marrow.    Final Interpretation      BONE MARROW; FLOW CYTOMETRIC ANALYSIS:   No increased blasts identified. (see comment)  Monocytosis (17% of total events).    COMMENT: Flow cytometry identified about 0.6% of total events as immature cells. These cells express CD45 (dim), CD34, CD117, HLA-DR, CD38, CD33 (variable). This immunophenotype is consistent with normal myeloblasts. In addition, monocytes are increased and comprise of approximately 17% of total events. These monocytes show normal expression of CD33/CD64/CD14/CD11b with variable expression of CD4. Correlation with concurrent morphology and clinical data is recommended (see [id number]).    FLOW CYTOMETRY ANALYSIS: CD45 versus side scatter analysis demonstrate a predominance of granulocytes (~70%), monocytes (~17%) and lymphocytes (~6%). No significant increase in blasts identified. All tested markers were used for adequate analysis of the cells and were appropriately reviewed.     12/28/2021 -  Chemotherapy   Patient is on Treatment Plan : MYELODYSPLASIA  Azacitidine IV D1-5 q28d        Discussed the use of AI scribe software for clinical note transcription with the patient, who gave verbal consent to proceed.  History of Present Illness   The patient, with a history of Chronic Myelomonocytic Leukemia ([name]) and liver disease, presents with multiple concerns. She reports a bloated stomach and swelling in her legs, which  she attributes to fluid accumulation. She has had multiple falls resulting in  fractures in her ribs and hip. She uses a walker for mobility but reports weakness and fatigue, which she believes contributed to her falls. She also reports a decrease in appetite and weight loss, although she believes she has gained weight due to fluid accumulation. She has been experiencing itching and has been scratching her skin, causing it to turn purple. She also reports back pain, particularly at night. She has been taking multiple medications including oxycodone, lactulose, metoprolol, pregabalin, Januvia, furosemide, lorazepam, omeprazole, and Paxil. She has not been taking her medications regularly due to concerns about side effects and blood pressure. She is considering moving to a long-term care facility due to her deteriorating health and the stress her condition is causing her daughter.         All other systems were reviewed with the patient and are negative.  MEDICAL HISTORY:  Past Medical History:  Diagnosis Date   Acute bronchitis due to COVID-19 virus 05/16/2022   Diabetes (HCC)    HLD (hyperlipidemia)    Hypertension    Pernicious anemia    Pneumonia 09/05/2019   Renal insufficiency 09/06/2019   Skin rash 12/21/2022   Vitamin D deficiency     SURGICAL HISTORY: Past Surgical History:  Procedure Laterality Date   ABDOMINAL HERNIA REPAIR  2009   ABDOMINOPLASTY     AXILLARY LYMPH NODE BIOPSY Left 11/11/2020   Procedure: LEFT AXILLARY EXCISIONAL LYMPH NODE BIOPSY;  Surgeon: Darnell Level, MD;  Location: [facility] OR;  Service: General;  Laterality: Left;   CESAREAN SECTION  1978   GASTRIC BYPASS  2000   hemorroid surgery  2007   IR IMAGING GUIDED PORT INSERTION  07/26/2021   IR KYPHO EA ADDL LEVEL THORACIC OR LUMBAR  05/31/2021   IR KYPHO THORACIC WITH BONE BIOPSY  05/31/2021   TONSILLECTOMY AND ADENOIDECTOMY  1957    I have reviewed the social history and family history with the patient and they are unchanged from previous note.  ALLERGIES:  is allergic to asa [aspirin],  nsaids, and red blood cells.  MEDICATIONS:  Current Outpatient Medications  Medication Sig Dispense Refill   cyclobenzaprine (FLEXERIL) 5 MG tablet Take 1 tablet (5 mg total) by mouth 3 (three) times daily as needed for muscle spasms. 30 tablet 0   fluocinonide cream (LIDEX) 0.05 % Apply 1 Application topically 2 (two) times daily. 60 g 1   furosemide (LASIX) 20 MG tablet TAKE 1 TABLET BY MOUTH EVERY DAY FOR UP TO 7 DAYS THEN AS NEEDED FOR LEG EDEMA 90 tablet 1   furosemide (LASIX) 20 MG tablet Take 1 tablet (20 mg total) by mouth daily as needed. 20 tablet 0   glucose blood (ONETOUCH VERIO) test strip Monitor blood sugars twice daily 100 each 5   KLOR-CON M10 10 MEQ tablet TAKE 1 TABLET BY MOUTH TWICE A DAY TAKE ALONG WITH FUROSEMIDE ONLY 180 tablet 1   lactulose (CHRONULAC) 10 GM/15ML solution Take 30 mLs (20 g total) by mouth 2 (two) times daily as needed for mild constipation. As needed to maintain bowel movement 1-3 times a day 236 mL 0   Lancets (ONETOUCH ULTRASOFT) lancets Monitor blood sugars twice daily 100 each 5   lidocaine (HM LIDOCAINE PATCH) 4 % Place 2 patches onto the skin daily. 60 patch 0   lidocaine-prilocaine (EMLA) cream APPLY 2 GRAMS TO PORT-A-CATH SITE 30-60 MINUTES PRIOR TO PORT ACCESS AS NEEDED 30 g 1  LORazepam (ATIVAN) 1 MG tablet TAKE 1 TABLET BY MOUTH EVERY 8 HOURS AS NEEDED FOR ANXIETY 30 tablet 0   metoprolol succinate (TOPROL-XL) 25 MG 24 hr tablet Take 1 tablet (25 mg total) by mouth daily. 90 tablet 1   naloxone (NARCAN) nasal spray 4 mg/0.1 mL Place 1 spray into the nose once as needed (overdose). 2 each 0   omeprazole (PRILOSEC) 20 MG capsule Take 20 mg by mouth daily.     ondansetron (ZOFRAN-ODT) 8 MG disintegrating tablet Take 0.5 tablets (4 mg total) by mouth every 8 (eight) hours as needed for nausea or vomiting. 30 tablet 3   Oxycodone HCl 10 MG TABS Take 1 tablet (10 mg total) by mouth 2 (two) times daily as needed. 60 tablet 0   Oxycodone HCl 10 MG  TABS Take 1 tablet (10 mg total) by mouth 2 (two) times daily as needed. 60 tablet 0   Oxycodone HCl 10 MG TABS Take 1 tablet (10 mg total) by mouth 2 (two) times daily as needed. 60 tablet 0   PARoxetine (PAXIL) 10 MG tablet Take by mouth.     polyethylene glycol powder (GLYCOLAX/MIRALAX) 17 GM/SCOOP powder Dissolve 1 capful (17 g) in water and drink 2 (two) times daily. (Patient taking differently: Take 17 g by mouth daily.) 510 g 2   pregabalin (LYRICA) 150 MG capsule Take 1 capsule (150 mg total) by mouth 2 (two) times daily. 180 capsule 1   prochlorperazine (COMPAZINE) 10 MG tablet Take 1 tablet (10 mg total) by mouth every 6 (six) hours as needed. 30 tablet 3   promethazine-dextromethorphan (PROMETHAZINE-DM) 6.25-15 MG/5ML syrup Take 5 mLs by mouth 4 (four) times daily as needed. 118 mL 0   SENEXON-S 8.6-50 MG tablet TAKE 2 TABLETS BY MOUTH AT BEDTIME AS NEEDED FOR MILD CONSTIPATION. 60 tablet 1   sitaGLIPtin (JANUVIA) 50 MG tablet Take 1 tablet (50 mg total) by mouth daily. 90 tablet 1   triamcinolone ointment (KENALOG) 0.5 % Apply 1 Application topically 2 (two) times daily. 30 g 0   No current facility-administered medications for this visit.   Facility-Administered Medications Ordered in Other Visits  Medication Dose Route Frequency Provider Last Rate Last Admin   0.9 %  sodium chloride infusion (Manually program via Guardrails IV Fluids)  250 mL Intravenous Once Malachy Mood, MD       0.9 %  sodium chloride infusion   Intravenous Continuous Malachy Mood, MD   Stopped at 03/13/23 1420    PHYSICAL EXAMINATION: ECOG PERFORMANCE STATUS: 3 - Symptomatic, >50% confined to bed  Vitals:   03/13/23 1230  BP: (!) 106/57  Pulse: (!) 105  Resp: 17  Temp: 99.4 F (37.4 C)  SpO2: 99%   Wt Readings from Last 3 Encounters:  03/13/23 144 lb 14.4 oz (65.7 kg)  01/09/23 153 lb 14.4 oz (69.8 kg)  12/27/22 155 lb 9.6 oz (70.6 kg)     GENERAL:alert, no distress and comfortable SKIN: Dry skin,  multiple ecchymosis and scratching signs on her face, neck and arms EYES: normal, Conjunctiva are pink and non-injected, sclera clear NECK: supple, thyroid normal size, non-tender, without nodularity LYMPH:  no palpable lymphadenopathy in the cervical, axillary  LUNGS: clear to auscultation and percussion with normal breathing effort HEART: regular rate & rhythm and no murmurs and no lower extremity edema ABDOMEN:(+) Significantly distended, no tenderness, (+) ascites  Musculoskeletal:no cyanosis of digits and no clubbing  NEURO: alert & oriented x 3 with fluent speech, no focal  motor/sensory deficits EXTREMITIES: Lower extremities with edema.      LABORATORY DATA:  I have reviewed the data as listed    Latest Ref Rng & Units 03/13/2023   11:22 AM 02/14/2023    1:29 PM 01/11/2023    7:39 AM  CBC  WBC 4.0 - 10.5 K/uL 14.9  7.6  6.8   Hemoglobin 12.0 - 15.0 g/dL 8.6  01.6  9.9   Hematocrit 36.0 - 46.0 % 24.7  31.4  29.4   Platelets 150 - 400 K/uL 54  47  123         Latest Ref Rng & Units 03/13/2023   11:22 AM 02/14/2023    1:29 PM 01/11/2023    7:39 AM  CMP  Glucose 70 - 99 mg/dL 010  932  355   BUN 8 - 23 mg/dL 21  28  19    Creatinine 0.44 - 1.00 mg/dL 7.32  2.02  5.42   Sodium 135 - 145 mmol/L 136  140  142   Potassium 3.5 - 5.1 mmol/L 4.1  4.5  3.5   Chloride 98 - 111 mmol/L 106  109  109   CO2 22 - 32 mmol/L 24  22  26    Calcium 8.9 - 10.3 mg/dL 7.8  8.1  8.5   Total Protein 6.5 - 8.1 g/dL 6.5  6.9    Total Bilirubin 0.3 - 1.2 mg/dL 2.8  1.9    Alkaline Phos 38 - 126 U/L 181  229    AST 15 - 41 U/L 18  27    ALT 0 - 44 U/L 13  24        RADIOGRAPHIC STUDIES: I have personally reviewed the radiological images as listed and agreed with the findings in the report. No results found.    No orders of the defined types were placed in this encounter.  All questions were answered. The patient knows to call the clinic with any problems, questions or concerns. No barriers to  learning was detected. The total time spent in the appointment was 40 minutes.     Malachy Mood, MD 03/13/2023

## 2023-03-14 ENCOUNTER — Inpatient Hospital Stay: Payer: Medicare HMO

## 2023-03-15 ENCOUNTER — Inpatient Hospital Stay: Payer: Medicare HMO

## 2023-03-15 ENCOUNTER — Ambulatory Visit (HOSPITAL_COMMUNITY)
Admission: RE | Admit: 2023-03-15 | Discharge: 2023-03-15 | Disposition: A | Discharge status: Home / Self Care | Payer: Medicare HMO | Source: Ambulatory Visit | Attending: Hematology | Admitting: Hematology

## 2023-03-15 ENCOUNTER — Inpatient Hospital Stay: Payer: Medicare HMO | Attending: Hematology

## 2023-03-15 ENCOUNTER — Other Ambulatory Visit: Payer: Self-pay

## 2023-03-15 VITALS — BP 114/66 | HR 93 | Temp 97.7°F | Resp 16

## 2023-03-15 DIAGNOSIS — R630 Anorexia: Secondary | ICD-10-CM | POA: Diagnosis not present

## 2023-03-15 DIAGNOSIS — D696 Thrombocytopenia, unspecified: Secondary | ICD-10-CM

## 2023-03-15 DIAGNOSIS — K7460 Unspecified cirrhosis of liver: Secondary | ICD-10-CM | POA: Insufficient documentation

## 2023-03-15 DIAGNOSIS — R5383 Other fatigue: Secondary | ICD-10-CM | POA: Insufficient documentation

## 2023-03-15 DIAGNOSIS — D693 Immune thrombocytopenic purpura: Secondary | ICD-10-CM | POA: Diagnosis present

## 2023-03-15 DIAGNOSIS — C9310 Chronic myelomonocytic leukemia not having achieved remission: Secondary | ICD-10-CM | POA: Diagnosis not present

## 2023-03-15 DIAGNOSIS — R188 Other ascites: Secondary | ICD-10-CM | POA: Diagnosis not present

## 2023-03-15 DIAGNOSIS — R509 Fever, unspecified: Secondary | ICD-10-CM | POA: Diagnosis not present

## 2023-03-15 DIAGNOSIS — D649 Anemia, unspecified: Secondary | ICD-10-CM

## 2023-03-15 DIAGNOSIS — Z95828 Presence of other vascular implants and grafts: Secondary | ICD-10-CM

## 2023-03-15 HISTORY — PX: IR PARACENTESIS: IMG2679

## 2023-03-15 LAB — CBC WITH DIFFERENTIAL (CANCER CENTER ONLY)
Abs Immature Granulocytes: 0.34 10*3/uL — ABNORMAL HIGH (ref 0.00–0.07)
Basophils Absolute: 0 10*3/uL (ref 0.0–0.1)
Basophils Relative: 0 %
Eosinophils Absolute: 0 10*3/uL (ref 0.0–0.5)
Eosinophils Relative: 0 %
HCT: 22.6 % — ABNORMAL LOW (ref 36.0–46.0)
Hemoglobin: 7.7 g/dL — ABNORMAL LOW (ref 12.0–15.0)
Immature Granulocytes: 6 %
Lymphocytes Relative: 6 %
Lymphs Abs: 0.4 10*3/uL — ABNORMAL LOW (ref 0.7–4.0)
MCH: 32.2 pg (ref 26.0–34.0)
MCHC: 34.1 g/dL (ref 30.0–36.0)
MCV: 94.6 fL (ref 80.0–100.0)
Monocytes Absolute: 1 10*3/uL (ref 0.1–1.0)
Monocytes Relative: 16 %
Neutro Abs: 4.4 10*3/uL (ref 1.7–7.7)
Neutrophils Relative %: 72 %
Platelet Count: 101 10*3/uL — ABNORMAL LOW (ref 150–400)
RBC: 2.39 MIL/uL — ABNORMAL LOW (ref 3.87–5.11)
RDW: 20.6 % — ABNORMAL HIGH (ref 11.5–15.5)
Smear Review: NORMAL
WBC Count: 6.1 10*3/uL (ref 4.0–10.5)
nRBC: 0 % (ref 0.0–0.2)

## 2023-03-15 LAB — PREPARE RBC (CROSSMATCH)

## 2023-03-15 LAB — AMMONIA: Ammonia: 47 umol/L — ABNORMAL HIGH (ref 9–35)

## 2023-03-15 LAB — SAMPLE TO BLOOD BANK

## 2023-03-15 MED ORDER — HEPARIN SOD (PORK) LOCK FLUSH 100 UNIT/ML IV SOLN
500.0000 [IU] | Freq: Once | INTRAVENOUS | Status: AC
  Administered 2023-03-15: 500 [IU]

## 2023-03-15 MED ORDER — SODIUM CHLORIDE 0.9% FLUSH
10.0000 mL | Freq: Once | INTRAVENOUS | Status: AC
  Administered 2023-03-15: 10 mL

## 2023-03-15 MED ORDER — LIDOCAINE HCL 1 % IJ SOLN
INTRAMUSCULAR | Status: AC
  Filled 2023-03-15: qty 20

## 2023-03-15 MED ORDER — ROMIPLOSTIM INJECTION 500 MCG
700.0000 ug | Freq: Once | SUBCUTANEOUS | Status: AC
  Administered 2023-03-15: 700 ug via SUBCUTANEOUS
  Filled 2023-03-15: qty 1

## 2023-03-15 MED ORDER — LIDOCAINE HCL (PF) 1 % IJ SOLN
20.0000 mL | Freq: Once | INTRAMUSCULAR | Status: AC
  Administered 2023-03-15: 5 mL via INTRADERMAL

## 2023-03-15 NOTE — Procedures (Signed)
PROCEDURE SUMMARY:  Successful US guided paracentesis from right lateral abdomen.  Yielded 2.4 liters of dark yellow fluid.  No immediate complications.  Pt tolerated well.   Specimen was not sent for labs.  EBL < 5mL  Hoyt Koch PA-C 03/15/2023 11:11 AM

## 2023-03-16 ENCOUNTER — Inpatient Hospital Stay: Payer: Medicare HMO

## 2023-03-17 ENCOUNTER — Encounter: Payer: Self-pay | Admitting: General Practice

## 2023-03-17 ENCOUNTER — Inpatient Hospital Stay: Payer: Medicare HMO

## 2023-03-17 MED FILL — Dexamethasone Sodium Phosphate Inj 100 MG/10ML: INTRAMUSCULAR | Qty: 1 | Status: AC

## 2023-03-17 NOTE — Progress Notes (Signed)
[facility] Spiritual Care Note  Missed Ms Craine in infusion this week, so phoned to introduce Spiritual Care as part of her Patient and Family Support team. Reached her daughter Raynelle Fanning, with whom she is staying; we plan for me to call again early next week.   [address] [place], [place], [facility] Pager [phone] Voicemail [phone]

## 2023-03-18 ENCOUNTER — Other Ambulatory Visit: Payer: Self-pay

## 2023-03-18 NOTE — Progress Notes (Unsigned)
Patient Care Team: Natalia Leatherwood, DO as PCP - General (Family Medicine) Myrtie Neither Andreas Blower, MD as Consulting Physician (Gastroenterology) Malachy Mood, MD as Consulting Physician (Hematology) Glendale Chard, DO as Consulting Physician (Neurology)   CHIEF COMPLAINT: Follow up [name] and thrombocytopenia   Oncology History  [name] (chronic myelomonocytic leukemia) (HCC)  10/29/2020 Imaging   CT CAP  IMPRESSION: 1. Splenomegaly with pathologically enlarged lymph nodes above and below the diaphragm, with overall stable to minimally increased abdominal adenopathy and interval progression of the pelvic adenopathy. 2. Small volume abdominopelvic ascites with diffuse mesenteric stranding. 3. Scattered bilateral pulmonary micro nodules measuring 1-2 mm. 4. Distended gallbladder with some layering hyperdense material representing layering sludge and tiny stones seen on prior ultrasound. 5. Aortic atherosclerosis.   10/30/2020 Pathology Results   DIAGNOSIS:   BONE MARROW, ASPIRATE, CLOT, CORE:  -  Hypercellular bone marrow with panhyperplasia, atypia, and no  increase in blasts  -  See comment   PERIPHERAL BLOOD:  -  Marked thrombocytopenia  -  Absolute monocytosis  -  Normocytic anemia  -  See CBC data and comment   COMMENT:  The bone marrow is hypercellular for age (approximately 80%) with myeloid hyperplasia with maturational left shift, erythroid hyperplasia, and increased megakaryocytes.  Mild multilineage atypia is present. Blasts are not increased on aspirate smears (1% by manual differential count) or by CD34 immunohistochemistry on the core biopsy.  Concurrent flow cytometric analysis of the bone marrow aspirate demonstrates increased monocytes, and no increase in blasts or abnormal lymphoid population (see [id number]).  Monocytes are also increased in peripheral  blood, persistent per electronic medical record.  In aggregate, the  findings raise the possibility of a myeloid  neoplasm with the  differential diagnosis including a low-grade myelodysplastic syndrome and chronic myelomonocytic leukemia (dysplastic type).    ADDENDUM:  A reticulin special stain performed on the bone marrow core biopsy reveals no significant increase in reticulin fibrosis.  ADDENDUM:  CYTOGENETIC RESULTS:  Karyotype: 46,XX[20]  Interpretation: NORMAL FEMALE KARYOTYPE   FISH RESULTS:  Results: NORMAL   ADDENDUM:  CD123 immunohistochemistry performed on the core biopsy highlights scattered aggregates of positively staining cells consistent with plasmacytoid dendritic cells.    10/30/2020 Pathology Results   DIAGNOSIS:   BONE MARROW; FLOW CYTOMETRIC ANALYSIS:  -  Increased monocytes  -  Scant B-cells present  -  No immunophenotypically aberrant T-cell population identified  -  No increase in blasts  -  See comment   COMMENT:  Monocytes are relatively increased (12% of all cells), without aberrant expression of CD56.  B-cells comprise <1% of total lymphocytes. CD34-positive blasts are not increased (<1% of all cells).  Correlation with concurrent morphology is recommended for complete diagnostic interpretation and overall blast enumeration (see [id number]).    10/30/2020 Pathology Results   FINAL MICROSCOPIC DIAGNOSIS:   A. LYMPH NODE, RIGHT AXILLARY, NEEDLE CORE BIOPSY:  -Lymphoid tissue present  -See comment   COMMENT:  The sections show several small needle core biopsy fragments of lymphoid tissue displaying degenerative cellular changes/necrosis and hence cannot be accurately evaluated.  Sample for flow cytometric analysis not available.  Immunohistochemical stain for CD3 and CD20 were performed with appropriate controls.  There is a mixture of T and B cells in their apparently respective compartments.  There is no definite metastatic malignancy.    11/11/2020 Pathology Results   DIAGNOSIS:   LEFT AXILLARY LYMPH NODE EXCISIONAL BIOPSY; FLOW CYTOMETRIC ANALYSIS:  -  No  monotypic  B-cell or immunophenotypically aberrant T-cell  population identified  -  See comment   COMMENT:  Flow cytometric analysis identifies B-cells with a normal kappa:lambda ratio of 1.7:1.  A subset of the polytypic B-cells expresses CD10. T-cells show a CD4:CD8 ratio of 3.3:1 without immunophenotypic aberrancy with the markers evaluated.  Although these results do not support the diagnosis of a clonal lymphoid population, sampling issues must always be considered when negative results are obtained, as focal lesions may not be represented in the specimen submitted.  In addition, flow cytometric immunophenotyping will not exclude other pathology if present (e.g. Hodgkin lymphoma, some T-cell lymphomas, metastatic and infectious diseases).   11/11/2020 Pathology Results   FINAL MICROSCOPIC DIAGNOSIS:   A. LYMPH NODE, LEFT AXILLARY, DISSECTION:  -  Follicular hyperplasia with interfollicular expansion  -  See comment   COMMENT:  Sections of the lymph nodes reveal generally preserved lymph node architecture.  There is follicular hyperplasia with some follicles showing increased hyalinization of germinal centers and mild concentric encircling of germinal centers with small lymphocytes (onion skinning). Interfollicular areas are expanded with increased vascular proliferation, small lymphocytes, plasma cells, histiocytic cells, and patchy neutrophils.  The lymph node capsule is variably thickened by fibrosis with few plasma cells.   A panel of immunohistochemical stains is performed for further  characterization.  CD3 and CD20/PAX5 highlight T-cell and B-cell  compartments, respectively.  Germinal centers are highlighted by CD10 and BCL6 with appropriate absent expression of BCL2.  CD5 is similar to CD3.  CD43 also stains the T-cells.  CD4-positive T-cells exceed CD8-positive T-cells.  CD30 highlights scattered immunoblasts.  CD21 reveals intact follicular dendritic cell meshworks.  CD68 highlights  increased histiocytic cells.  HHV 8 is negative.  T. pallidum reveals no definitive organisms.  Pancytokeratin is negative.  TdT stains rare cells.  CD138 highlights plasma cells which are not increased in number and show polytypic light chain expression by kappa/lambda in situ  hybridization.  EBV is negative by in situ hybridization.   Concurrent flow cytometric analysis is negative for a monoclonal B-cell or immunophenotypically aberrant T-cell population (see [phone]).   Together, the findings above are non-specific and can be seen in  reactive and infectious processes as well as Castleman disease.  There is no definitive morphologic or flow cytometric evidence of involvement by a lymphoproliferative disorder from the current workup.   12/17/2020 Initial Diagnosis   [name] (chronic myelomonocytic leukemia) (HCC)   12/28/2020 - 02/03/2022 Chemotherapy   Patient is on Treatment Plan : MYELODYSPLASIA  Azacitidine IV D1-5 q28d     03/18/2021 Pathology Results   Final Diagnosis    BONE MARROW:             Persistent chronic myelomonocytic leukemia in a hypercellular bone marrow with increased monocytes and no increase in blasts.   Comment    Flow cytometric analysis showed no increase in blasts and 17% monocytes.A next generation myeloid panel showed the presence of the following mutations: NRAS, SH2B3, PHF6 and TET2. CD34 and CD117 immunstains do not show an increase in blasts. The findings are consistent with persistent [name] with a decrease in the number of blasts compared to that seen on the previous bone marrow.    Final Interpretation      BONE MARROW; FLOW CYTOMETRIC ANALYSIS:   No increased blasts identified. (see comment)  Monocytosis (17% of total events).    COMMENT: Flow cytometry identified about 0.6% of total events as immature cells. These cells express CD45 (dim), CD34,  CD117, HLA-DR, CD38, CD33 (variable). This immunophenotype is consistent with normal myeloblasts. In  addition, monocytes are increased and comprise of approximately 17% of total events. These monocytes show normal expression of CD33/CD64/CD14/CD11b with variable expression of CD4. Correlation with concurrent morphology and clinical data is recommended (see [id number]).    FLOW CYTOMETRY ANALYSIS: CD45 versus side scatter analysis demonstrate a predominance of granulocytes (~70%), monocytes (~17%) and lymphocytes (~6%). No significant increase in blasts identified. All tested markers were used for adequate analysis of the cells and were appropriately reviewed.     12/28/2021 -  Chemotherapy   Patient is on Treatment Plan : MYELODYSPLASIA  Azacitidine IV D1-5 q28d        CURRENT THERAPY:  Azacitidine days 1-5 q28 days, started 12/28/20 -Nplate 35mcg/kg weekly -Platelet transfusion as needed (plt<15 -20K), blood transfusion if Hg<8.0  INTERVAL HISTORY Ms. Kearn returns with her daughter Kathleen Gibson for follow up as scheduled. Last seen by Dr. Mosetta Putt 9/30 and received IVF. S/p therapeutic paracentesis 10/2 yielding 2.4 liters of fluid which she feels has reaccumulated right back.  The procedures help feel better and also with her balance.  Lasix does not really help much.  She remains weak, but ambulatory at home and has been going on some errands.  She is eating and drinking.  Stable pain in the posterior right ribs.  Denies cough, chest pain, dyspnea.  Legs remain tight.  Denies dysuria, but urgent when she takes Lasix.  Denies bleeding unless she scratches herself.  He has been keeping a low-grade fever at home 99.7-100.3. while she was hospitalized in 02/2023 blood culture grew staph epidermidis and she completed 14 days IV vanc, not on orals recently.   Here today for GOC discussion and ongoing supportive care.  ROS  All other systems reviewed and negative  Past Medical History:  Diagnosis Date   Acute bronchitis due to COVID-19 virus 05/16/2022   Diabetes (HCC)    HLD (hyperlipidemia)     Hypertension    Pernicious anemia    Pneumonia 09/05/2019   Renal insufficiency 09/06/2019   Skin rash 12/21/2022   Vitamin D deficiency      Past Surgical History:  Procedure Laterality Date   ABDOMINAL HERNIA REPAIR  2009   ABDOMINOPLASTY     AXILLARY LYMPH NODE BIOPSY Left 11/11/2020   Procedure: LEFT AXILLARY EXCISIONAL LYMPH NODE BIOPSY;  Surgeon: Darnell Level, MD;  Location: [facility] OR;  Service: General;  Laterality: Left;   CESAREAN SECTION  1978   GASTRIC BYPASS  2000   hemorroid surgery  2007   IR IMAGING GUIDED PORT INSERTION  07/26/2021   IR KYPHO EA ADDL LEVEL THORACIC OR LUMBAR  05/31/2021   IR KYPHO THORACIC WITH BONE BIOPSY  05/31/2021   IR PARACENTESIS  03/15/2023   TONSILLECTOMY AND ADENOIDECTOMY  1957     Outpatient Encounter Medications as of 03/20/2023  Medication Sig Note   allopurinol (ZYLOPRIM) 300 MG tablet Take by mouth.    cyclobenzaprine (FLEXERIL) 5 MG tablet Take 1 tablet (5 mg total) by mouth 3 (three) times daily as needed for muscle spasms.    fluocinonide cream (LIDEX) 0.05 % Apply 1 Application topically 2 (two) times daily.    furosemide (LASIX) 20 MG tablet TAKE 1 TABLET BY MOUTH EVERY DAY FOR UP TO 7 DAYS THEN AS NEEDED FOR LEG EDEMA    furosemide (LASIX) 20 MG tablet Take 1 tablet (20 mg total) by mouth daily as needed.    glucose blood (ONETOUCH VERIO)  test strip Monitor blood sugars twice daily    KLOR-CON M10 10 MEQ tablet TAKE 1 TABLET BY MOUTH TWICE A DAY TAKE ALONG WITH FUROSEMIDE ONLY    lactulose (CHRONULAC) 10 GM/15ML solution Take 30 mLs (20 g total) by mouth 2 (two) times daily as needed for mild constipation. As needed to maintain bowel movement 1-3 times a day    Lancets (ONETOUCH ULTRASOFT) lancets Monitor blood sugars twice daily    lidocaine (HM LIDOCAINE PATCH) 4 % Place 2 patches onto the skin daily.    lidocaine-prilocaine (EMLA) cream APPLY 2 GRAMS TO PORT-A-CATH SITE 30-60 MINUTES PRIOR TO PORT ACCESS AS NEEDED    LORazepam  (ATIVAN) 1 MG tablet TAKE 1 TABLET BY MOUTH EVERY 8 HOURS AS NEEDED FOR ANXIETY    metoprolol succinate (TOPROL-XL) 25 MG 24 hr tablet Take 1 tablet (25 mg total) by mouth daily.    naloxone (NARCAN) nasal spray 4 mg/0.1 mL Place 1 spray into the nose once as needed (overdose). 07/26/2021: never   omeprazole (PRILOSEC) 20 MG capsule Take 20 mg by mouth daily.    ondansetron (ZOFRAN-ODT) 8 MG disintegrating tablet Take 0.5 tablets (4 mg total) by mouth every 8 (eight) hours as needed for nausea or vomiting.    Oxycodone HCl 10 MG TABS Take 1 tablet (10 mg total) by mouth 2 (two) times daily as needed.    Oxycodone HCl 10 MG TABS Take 1 tablet (10 mg total) by mouth 2 (two) times daily as needed.    Oxycodone HCl 10 MG TABS Take 1 tablet (10 mg total) by mouth 2 (two) times daily as needed.    PARoxetine (PAXIL) 10 MG tablet Take by mouth.    polyethylene glycol powder (GLYCOLAX/MIRALAX) 17 GM/SCOOP powder Dissolve 1 capful (17 g) in water and drink 2 (two) times daily. (Patient taking differently: Take 17 g by mouth daily.)    pregabalin (LYRICA) 150 MG capsule Take 1 capsule (150 mg total) by mouth 2 (two) times daily.    prochlorperazine (COMPAZINE) 10 MG tablet Take 1 tablet (10 mg total) by mouth every 6 (six) hours as needed.    promethazine-dextromethorphan (PROMETHAZINE-DM) 6.25-15 MG/5ML syrup Take 5 mLs by mouth 4 (four) times daily as needed.    SENEXON-S 8.6-50 MG tablet TAKE 2 TABLETS BY MOUTH AT BEDTIME AS NEEDED FOR MILD CONSTIPATION.    sitaGLIPtin (JANUVIA) 50 MG tablet Take 1 tablet (50 mg total) by mouth daily.    triamcinolone ointment (KENALOG) 0.5 % Apply 1 Application topically 2 (two) times daily.    hydrOXYzine (ATARAX) 10 MG tablet Take 10 mg by mouth every 8 (eight) hours as needed.    Facility-Administered Encounter Medications as of 03/20/2023  Medication   0.9 %  sodium chloride infusion (Manually program via Guardrails IV Fluids)     Today's Vitals   03/20/23 0839  03/20/23 0851  BP: 126/77   Pulse: (!) 115   Resp: 18   Temp: 100.3 F (37.9 C)   TempSrc: Oral   SpO2: 99%   Weight: 147 lb 9.6 oz (67 kg)   Height: 5\' 6"  (1.676 m)   PainSc:  3    Body mass index is 23.82 kg/m.   PHYSICAL EXAM GENERAL:alert, frail adult female in no distress and comfortable in wheelchair SKIN: no rash  EYES: sclera clear  LUNGS: clear with normal breathing effort HEART: regular rate & rhythm, bilateral lower extremity edema ABDOMEN: abdomen distended and firm, non tender, normal bowel sounds NEURO: alert &  oriented x 3 with fluent speech PAC without erythema    CBC    Component Value Date/Time   WBC 7.3 03/20/2023 0816   WBC 6.8 01/11/2023 0739   RBC 2.66 (L) 03/20/2023 0816   HGB 8.1 (L) 03/20/2023 0816   HGB 11.4 (L) 07/23/2015 0910   HCT 25.0 (L) 03/20/2023 0816   HCT 34.9 07/23/2015 0910   PLT 145 (L) 03/20/2023 0816   PLT 103 (L) 07/23/2015 0910   MCV 94.0 03/20/2023 0816   MCV 87.5 07/23/2015 0910   MCH 30.5 03/20/2023 0816   MCHC 32.4 03/20/2023 0816   RDW 20.8 (H) 03/20/2023 0816   RDW 14.8 (H) 07/23/2015 0910   LYMPHSABS 0.5 (L) 03/20/2023 0816   LYMPHSABS 3.1 07/23/2015 0910   MONOABS 1.3 (H) 03/20/2023 0816   MONOABS 2.5 (H) 07/23/2015 0910   EOSABS 0.0 03/20/2023 0816   EOSABS 0.0 07/23/2015 0910   BASOSABS 0.1 03/20/2023 0816   BASOSABS 0.0 07/23/2015 0910     CMP     Component Value Date/Time   NA 136 03/20/2023 0816   NA 137 02/18/2021 0000   K 4.1 03/20/2023 0816   CL 107 03/20/2023 0816   CO2 26 03/20/2023 0816   GLUCOSE 187 (H) 03/20/2023 0816   BUN 17 03/20/2023 0816   BUN 13 02/18/2021 0000   CREATININE 0.95 03/20/2023 0816   CALCIUM 7.7 (L) 03/20/2023 0816   PROT 6.5 03/13/2023 1122   ALBUMIN 2.7 (L) 03/13/2023 1122   AST 18 03/13/2023 1122   AST 31 01/09/2023 0858   ALT 13 03/13/2023 1122   ALT 21 01/09/2023 0858   ALKPHOS 181 (H) 03/13/2023 1122   BILITOT 2.8 (H) 03/13/2023 1122   BILITOT 2.0 (H)  01/09/2023 0858   GFRNONAA >60 03/20/2023 0816   GFRAA 45 (L) 09/07/2019 0350   GFRAA >60 11/20/2017 0924     ASSESSMENT & PLAN:Kathleen Gibson is a 72 y.o. female with   1. [name] with severe thrombocytopenia  -Initially diagnosed with ITP in 2014. She lost follow up after 11/2017 and did not proceed with recommended bone marrow biopsy. -She was referred to ED 10/28/20 by her PCP for low plt count of 4k. She was hospitalized and treated with platelet transfusions, IVIG, oral prednisone 60mg  and Nplate injections. She tapered off prednisone given little response. -Bone marrow biopsy 10/30/20 felt to likely represent MDS/MPN, particularly [name].  Confirmed on slide review at [facility] by Dr. Sharyne Richters -She began treatment for severe refractory thrombocytopenia with weekly Rituxan x4 on 11/19/20, she did not respond  -She began azacitidine daily days 1-5 q. 28 days on 12/28/2020 -She had worsening thrombocytopenia after stopping Nplate, restarted weekly Nplate 10 mcg/kg on 01/04/2021.  Dose adjustment per platelet level, currently 10 mcg/kg -Bone marrow biopsy 03/18/2021 showed stable disease, no increase in blasts. -She continues azacitidine daily days 1-5 q. 28 days; and weekly Nplate - She continues monthly Vidaza days 1-5, PS and overall health has declined and shift to supportive care was introduced at last visit -Mr. Falk appears frail but stable. She remains weak with low PS and low grade fevers. Will check UA/cx and blood cultures (recent peripheral blood culture + staph epidermidis s/p IV Vanc in 02/2023) -We discussed we are reaching a point where treatment SE's outweigh the benefit and no longer align with her goals of care. We recommend to stop vidaza and Nplate  -We discussed oral alternatives which Dr. Mosetta Putt feels would not benefit and would likely  be more cytotoxic, not recommending  -We recommend to shift to comfort care with hospice; discussed skilled nursing facility vs residential and  logistics. Pt and her daughter agree. Will refer to home hospice at her daughter's house  -Discussed code status, [name] elects DNR -Will support with 1 unit pRBCs and therapeutic paracentsis today, will cancel future appts. She knows to call us if needed, Dr. Mosetta Putt will remain her attending and we are here if needed   2.  T7/T8 compression fracture, multiple falls -Platelet count improved, she was able to undergo T10 and T12 kyphoplasty 05/31/2021  -No longer using thoracic brace -She has not been able to find a dentist who takes Medicare for Zometa clearance, I previously referred her to Richrd Humbles, DDS. She has not called yet but has their # -Zometa remains on hold -Continue calcium   3. Normocytic anemia, moderate  -H/o pernicious anemia/vitamin B12 deficiency -Transfuse for Hgb < 8 and continue oral iron -B12 >3000 lately, last dose in 08/2022. Will hold injection for now, until B12 normalizes then resume. She is not taking oral B12 -Hgb 8.1 today, will give 1 unit   4. Type 2 diabetes mellitus with polyneuropathy, Hypertension, anxiety, edema -Continue medication, follow-up PCP -lasix PRN with K, takes ~once per week -Ativan as needed -She developed abdominal distention, found to have cirrhosis on last hospitalization s/p paracentesis -Requiring frequent paracentesis lately, 10/2 and would benefit from one today/tomorrow. Will arrange.  -Discussed possible abd pleurX placement, she would like to hold for now      PLAN: -Discontinue vidaza and Nplate -1 unit RBCs today -Therapeutic paracentesis today at 2 pm -Will f/up blood/urine cultures drawn today for low grade fevers -Refer to hospice to her daughter Kathleen Gibson's home, pt elects DNR -Pt seen with Dr. Mosetta Putt and met with SW Ronda Fairly today -Cancel future appts, F/up open    All questions were answered. The patient knows to call the clinic with any problems, questions or concerns. No barriers to learning were detected.    Kathleen Glad, NP-C 03/20/2023  Addendum I have seen the patient, examined her. I agree with the assessment and and plan and have edited the notes.   Ms. Rease is not doing well clinically, with worsening abdominal bloating from ascites, fatigue, anorexia.  I recommend stopping chemotherapy, and focus on symptom management and quality of life.  Will give 1 unit of blood transfusion today, and refer her to hospice.  Will schedule repeated paracentesis.  Her daughter lives in [place], patient was seen by our Child psychotherapist today.  All questions were answered, I will be happy to be her attending when she is under hospice care.  I spent a total of 25 minutes for her visit today, more than 50% time on face-to-face counseling.  Malachy Mood MD 03/20/2023

## 2023-03-19 LAB — TYPE AND SCREEN
ABO/RH(D): O POS
Antibody Screen: NEGATIVE
Unit division: 0

## 2023-03-19 LAB — BPAM RBC
Blood Product Expiration Date: 202410232359
Unit Type and Rh: 5100

## 2023-03-20 ENCOUNTER — Inpatient Hospital Stay: Payer: Medicare HMO

## 2023-03-20 ENCOUNTER — Other Ambulatory Visit: Payer: Self-pay

## 2023-03-20 ENCOUNTER — Inpatient Hospital Stay (HOSPITAL_BASED_OUTPATIENT_CLINIC_OR_DEPARTMENT_OTHER): Payer: Medicare HMO | Admitting: Nurse Practitioner

## 2023-03-20 ENCOUNTER — Inpatient Hospital Stay: Payer: Medicare HMO | Admitting: Licensed Clinical Social Worker

## 2023-03-20 ENCOUNTER — Ambulatory Visit (HOSPITAL_COMMUNITY)
Admission: RE | Admit: 2023-03-20 | Discharge: 2023-03-20 | Disposition: A | Discharge status: Home / Self Care | Payer: Medicare HMO | Source: Ambulatory Visit | Attending: Hematology | Admitting: Hematology

## 2023-03-20 ENCOUNTER — Encounter: Payer: Self-pay | Admitting: Nurse Practitioner

## 2023-03-20 DIAGNOSIS — R188 Other ascites: Secondary | ICD-10-CM | POA: Insufficient documentation

## 2023-03-20 DIAGNOSIS — K7460 Unspecified cirrhosis of liver: Secondary | ICD-10-CM | POA: Insufficient documentation

## 2023-03-20 DIAGNOSIS — D696 Thrombocytopenia, unspecified: Secondary | ICD-10-CM

## 2023-03-20 DIAGNOSIS — C9310 Chronic myelomonocytic leukemia not having achieved remission: Secondary | ICD-10-CM

## 2023-03-20 DIAGNOSIS — D649 Anemia, unspecified: Secondary | ICD-10-CM

## 2023-03-20 HISTORY — PX: IR PARACENTESIS: IMG2679

## 2023-03-20 LAB — CBC WITH DIFFERENTIAL (CANCER CENTER ONLY)
Abs Immature Granulocytes: 0.45 10*3/uL — ABNORMAL HIGH (ref 0.00–0.07)
Basophils Absolute: 0.1 10*3/uL (ref 0.0–0.1)
Basophils Relative: 1 %
Eosinophils Absolute: 0 10*3/uL (ref 0.0–0.5)
Eosinophils Relative: 0 %
HCT: 25 % — ABNORMAL LOW (ref 36.0–46.0)
Hemoglobin: 8.1 g/dL — ABNORMAL LOW (ref 12.0–15.0)
Immature Granulocytes: 6 %
Lymphocytes Relative: 6 %
Lymphs Abs: 0.5 10*3/uL — ABNORMAL LOW (ref 0.7–4.0)
MCH: 30.5 pg (ref 26.0–34.0)
MCHC: 32.4 g/dL (ref 30.0–36.0)
MCV: 94 fL (ref 80.0–100.0)
Monocytes Absolute: 1.3 10*3/uL — ABNORMAL HIGH (ref 0.1–1.0)
Monocytes Relative: 18 %
Neutro Abs: 5 10*3/uL (ref 1.7–7.7)
Neutrophils Relative %: 69 %
Platelet Count: 145 10*3/uL — ABNORMAL LOW (ref 150–400)
RBC: 2.66 MIL/uL — ABNORMAL LOW (ref 3.87–5.11)
RDW: 20.8 % — ABNORMAL HIGH (ref 11.5–15.5)
Smear Review: NORMAL
WBC Count: 7.3 10*3/uL (ref 4.0–10.5)
nRBC: 0 % (ref 0.0–0.2)

## 2023-03-20 LAB — BASIC METABOLIC PANEL - CANCER CENTER ONLY
Anion gap: 3 — ABNORMAL LOW (ref 5–15)
BUN: 17 mg/dL (ref 8–23)
CO2: 26 mmol/L (ref 22–32)
Calcium: 7.7 mg/dL — ABNORMAL LOW (ref 8.9–10.3)
Chloride: 107 mmol/L (ref 98–111)
Creatinine: 0.95 mg/dL (ref 0.44–1.00)
GFR, Estimated: >60 mL/min (ref >60)
Glucose, Bld: 187 mg/dL — ABNORMAL HIGH (ref 70–99)
Potassium: 4.1 mmol/L (ref 3.5–5.1)
Sodium: 136 mmol/L (ref 135–145)

## 2023-03-20 LAB — URINALYSIS, COMPLETE (UACMP) WITH MICROSCOPIC
Bilirubin Urine: NEGATIVE
Glucose, UA: NEGATIVE mg/dL
Hgb urine dipstick: NEGATIVE
Ketones, ur: NEGATIVE mg/dL
Nitrite: NEGATIVE
Protein, ur: 30 mg/dL — AB
Specific Gravity, Urine: 1.025 (ref 1.005–1.030)
pH: 5 (ref 5.0–8.0)

## 2023-03-20 LAB — SAMPLE TO BLOOD BANK

## 2023-03-20 LAB — PREPARE RBC (CROSSMATCH)

## 2023-03-20 MED ORDER — HEPARIN SOD (PORK) LOCK FLUSH 100 UNIT/ML IV SOLN
500.0000 [IU] | Freq: Every day | INTRAVENOUS | Status: AC | PRN
  Administered 2023-03-20: 500 [IU]

## 2023-03-20 MED ORDER — SODIUM CHLORIDE 0.9% IV SOLUTION
250.0000 mL | Freq: Once | INTRAVENOUS | Status: AC
  Administered 2023-03-20: 250 mL via INTRAVENOUS

## 2023-03-20 MED ORDER — LIDOCAINE HCL 1 % IJ SOLN
INTRAMUSCULAR | Status: AC
  Filled 2023-03-20: qty 20

## 2023-03-20 MED ORDER — ACETAMINOPHEN 325 MG PO TABS
650.0000 mg | ORAL_TABLET | Freq: Once | ORAL | Status: AC
  Administered 2023-03-20: 650 mg via ORAL
  Filled 2023-03-20: qty 2

## 2023-03-20 MED ORDER — DIPHENHYDRAMINE HCL 25 MG PO CAPS
25.0000 mg | ORAL_CAPSULE | Freq: Once | ORAL | Status: AC
  Administered 2023-03-20: 25 mg via ORAL
  Filled 2023-03-20: qty 1

## 2023-03-20 MED ORDER — SODIUM CHLORIDE 0.9% FLUSH
10.0000 mL | INTRAVENOUS | Status: AC | PRN
  Administered 2023-03-20: 10 mL

## 2023-03-20 MED FILL — Dexamethasone Sodium Phosphate Inj 100 MG/10ML: INTRAMUSCULAR | Qty: 1 | Status: AC

## 2023-03-20 NOTE — Progress Notes (Signed)
Per Lacie NP OK to transfuse blood at 300 mL/hr today.

## 2023-03-20 NOTE — Patient Instructions (Signed)

## 2023-03-20 NOTE — Procedures (Signed)
PROCEDURE SUMMARY:  Successful ultrasound guided paracentesis from the right lower quadrant.  Yielded 1.7 L of clear yellow fluid.  No immediate complications.  The patient tolerated the procedure well.   Specimen not sent for labs.  EBL < 2 mL  The patient has required >/=2 paracenteses in a 30 day period and a screening evaluation by the [facility] has been arranged.   Alwyn Ren, [place] [phone] 03/20/2023, 2:59 PM

## 2023-03-21 ENCOUNTER — Inpatient Hospital Stay: Payer: Medicare HMO

## 2023-03-21 ENCOUNTER — Encounter: Payer: Medicare HMO | Admitting: Family Medicine

## 2023-03-21 ENCOUNTER — Encounter: Payer: Self-pay | Admitting: Hematology

## 2023-03-21 LAB — BPAM RBC
Blood Product Expiration Date: 202411012359
ISSUE DATE / TIME: 202410071149
Unit Type and Rh: 5100

## 2023-03-21 LAB — URINE CULTURE

## 2023-03-21 LAB — TYPE AND SCREEN
ABO/RH(D): O POS
Antibody Screen: NEGATIVE
Unit division: 0

## 2023-03-21 NOTE — Progress Notes (Signed)
  Pt now hospice patient. No charge

## 2023-03-21 NOTE — Progress Notes (Signed)
[facility] CSW Progress Note  Visual merchandiser met with patient and daughter to discuss hospice and additional support at home.  Pt's daughter resides in [place] and travels for work.  Pt would like to remain at home if possible.  Daughter inquiring about placement at a facility and the option to bring her mother home on weekends.  CSW provided contact information for case worker at the DSS outstation to inquire about expanding pt's Medicaid benefits and requested a home hospice referral be placed w/ services at daughter's home in [place] for the time being.  Pt's daughter also given contact information for [employer] for additional home support.  Contact information for CSW given to daughter as well.  CSW to remain available as appropriate.      Rachel Moulds, LCSW Clinical Social Worker [facility]

## 2023-03-22 ENCOUNTER — Inpatient Hospital Stay: Payer: Medicare HMO

## 2023-03-22 ENCOUNTER — Ambulatory Visit: Payer: Medicare HMO

## 2023-03-22 ENCOUNTER — Other Ambulatory Visit: Payer: Medicare HMO

## 2023-03-22 ENCOUNTER — Encounter: Payer: Self-pay | Admitting: Hematology

## 2023-03-23 ENCOUNTER — Other Ambulatory Visit: Payer: Self-pay | Admitting: Nurse Practitioner

## 2023-03-23 ENCOUNTER — Ambulatory Visit: Payer: Medicare HMO

## 2023-03-23 MED ORDER — CIPROFLOXACIN HCL 250 MG PO TABS
250.0000 mg | ORAL_TABLET | Freq: Two times a day (BID) | ORAL | 0 refills | Status: DC
Start: 2023-03-23 — End: 2023-03-31

## 2023-03-23 NOTE — Progress Notes (Signed)
I called pt and spoke to her daughter Kathleen Gibson. Kathleen Gibson fell but did not sustain significant injuries. Knee is sore and she seems more drowsy but otherwise doing OK. Did not call EMS. Eating and drinking. Still having low grade fevers.   I reviewed blood cultures which are negative to date, and Ua/culture which is abnormal and suggests contamination. Given her recent fall and that she is residing in [place] with her daughter now, and recent hospice referral, would not make her come back in for repeat urine. Will give few days of abx to see if this helps lethargy and fevers.   Kathleen Gibson knows to call back if status worsens or she has other concerns/changes.   Santiago Glad, NP

## 2023-03-24 ENCOUNTER — Inpatient Hospital Stay: Payer: Medicare HMO

## 2023-03-24 ENCOUNTER — Other Ambulatory Visit: Payer: Self-pay | Admitting: Hematology

## 2023-03-24 MED ORDER — LACTULOSE 10 GM/15ML PO SOLN: 20.0000 g | Freq: Two times a day (BID) | ORAL | 0 refills | Status: DC | PRN

## 2023-03-25 ENCOUNTER — Emergency Department (HOSPITAL_COMMUNITY): Payer: Medicare HMO

## 2023-03-25 ENCOUNTER — Inpatient Hospital Stay (HOSPITAL_COMMUNITY)
Admission: EM | Admit: 2023-03-25 | Discharge: 2023-03-31 | DRG: 956 | Disposition: A | Discharge status: Skilled Nursing Facility | Payer: Medicare HMO | Attending: Internal Medicine | Admitting: Internal Medicine

## 2023-03-25 ENCOUNTER — Other Ambulatory Visit: Payer: Self-pay

## 2023-03-25 ENCOUNTER — Inpatient Hospital Stay (HOSPITAL_COMMUNITY): Payer: Medicare HMO

## 2023-03-25 ENCOUNTER — Encounter (HOSPITAL_COMMUNITY): Payer: Self-pay | Admitting: Emergency Medicine

## 2023-03-25 DIAGNOSIS — Z7189 Other specified counseling: Secondary | ICD-10-CM | POA: Diagnosis not present

## 2023-03-25 DIAGNOSIS — D84822 Immunodeficiency due to external causes: Secondary | ICD-10-CM | POA: Diagnosis not present

## 2023-03-25 DIAGNOSIS — I10 Essential (primary) hypertension: Secondary | ICD-10-CM | POA: Diagnosis present

## 2023-03-25 DIAGNOSIS — M6281 Muscle weakness (generalized): Secondary | ICD-10-CM | POA: Diagnosis not present

## 2023-03-25 DIAGNOSIS — E785 Hyperlipidemia, unspecified: Secondary | ICD-10-CM | POA: Diagnosis not present

## 2023-03-25 DIAGNOSIS — E875 Hyperkalemia: Secondary | ICD-10-CM | POA: Diagnosis not present

## 2023-03-25 DIAGNOSIS — Z806 Family history of leukemia: Secondary | ICD-10-CM | POA: Diagnosis not present

## 2023-03-25 DIAGNOSIS — Z96649 Presence of unspecified artificial hip joint: Secondary | ICD-10-CM

## 2023-03-25 DIAGNOSIS — Z9884 Bariatric surgery status: Secondary | ICD-10-CM | POA: Diagnosis not present

## 2023-03-25 DIAGNOSIS — K7460 Unspecified cirrhosis of liver: Secondary | ICD-10-CM | POA: Diagnosis present

## 2023-03-25 DIAGNOSIS — R9389 Abnormal findings on diagnostic imaging of other specified body structures: Secondary | ICD-10-CM | POA: Diagnosis not present

## 2023-03-25 DIAGNOSIS — N189 Chronic kidney disease, unspecified: Secondary | ICD-10-CM | POA: Diagnosis not present

## 2023-03-25 DIAGNOSIS — S32591A Other specified fracture of right pubis, initial encounter for closed fracture: Secondary | ICD-10-CM | POA: Diagnosis not present

## 2023-03-25 DIAGNOSIS — Z794 Long term (current) use of insulin: Secondary | ICD-10-CM | POA: Diagnosis not present

## 2023-03-25 DIAGNOSIS — C9310 Chronic myelomonocytic leukemia not having achieved remission: Secondary | ICD-10-CM | POA: Diagnosis present

## 2023-03-25 DIAGNOSIS — E441 Mild protein-calorie malnutrition: Secondary | ICD-10-CM | POA: Diagnosis not present

## 2023-03-25 DIAGNOSIS — Z66 Do not resuscitate: Secondary | ICD-10-CM | POA: Diagnosis present

## 2023-03-25 DIAGNOSIS — D696 Thrombocytopenia, unspecified: Secondary | ICD-10-CM | POA: Diagnosis present

## 2023-03-25 DIAGNOSIS — Z800 Family history of malignant neoplasm of digestive organs: Secondary | ICD-10-CM | POA: Diagnosis not present

## 2023-03-25 DIAGNOSIS — G609 Hereditary and idiopathic neuropathy, unspecified: Secondary | ICD-10-CM | POA: Diagnosis not present

## 2023-03-25 DIAGNOSIS — S72011A Unspecified intracapsular fracture of right femur, initial encounter for closed fracture: Principal | ICD-10-CM | POA: Diagnosis present

## 2023-03-25 DIAGNOSIS — R188 Other ascites: Secondary | ICD-10-CM | POA: Diagnosis not present

## 2023-03-25 DIAGNOSIS — Z01818 Encounter for other preprocedural examination: Secondary | ICD-10-CM | POA: Diagnosis not present

## 2023-03-25 DIAGNOSIS — R5381 Other malaise: Secondary | ICD-10-CM | POA: Diagnosis not present

## 2023-03-25 DIAGNOSIS — Z95828 Presence of other vascular implants and grafts: Secondary | ICD-10-CM | POA: Diagnosis not present

## 2023-03-25 DIAGNOSIS — D649 Anemia, unspecified: Secondary | ICD-10-CM | POA: Diagnosis not present

## 2023-03-25 DIAGNOSIS — W010XXA Fall on same level from slipping, tripping and stumbling without subsequent striking against object, initial encounter: Secondary | ICD-10-CM | POA: Diagnosis present

## 2023-03-25 DIAGNOSIS — S32599D Other specified fracture of unspecified pubis, subsequent encounter for fracture with routine healing: Secondary | ICD-10-CM | POA: Diagnosis not present

## 2023-03-25 DIAGNOSIS — Z515 Encounter for palliative care: Secondary | ICD-10-CM | POA: Diagnosis not present

## 2023-03-25 DIAGNOSIS — Z833 Family history of diabetes mellitus: Secondary | ICD-10-CM | POA: Diagnosis not present

## 2023-03-25 DIAGNOSIS — E1122 Type 2 diabetes mellitus with diabetic chronic kidney disease: Secondary | ICD-10-CM | POA: Diagnosis not present

## 2023-03-25 DIAGNOSIS — N3000 Acute cystitis without hematuria: Secondary | ICD-10-CM | POA: Diagnosis present

## 2023-03-25 DIAGNOSIS — Z7401 Bed confinement status: Secondary | ICD-10-CM | POA: Diagnosis not present

## 2023-03-25 DIAGNOSIS — E1142 Type 2 diabetes mellitus with diabetic polyneuropathy: Secondary | ICD-10-CM | POA: Diagnosis present

## 2023-03-25 DIAGNOSIS — D62 Acute posthemorrhagic anemia: Secondary | ICD-10-CM | POA: Diagnosis present

## 2023-03-25 DIAGNOSIS — R2689 Other abnormalities of gait and mobility: Secondary | ICD-10-CM | POA: Diagnosis not present

## 2023-03-25 DIAGNOSIS — Z8249 Family history of ischemic heart disease and other diseases of the circulatory system: Secondary | ICD-10-CM

## 2023-03-25 DIAGNOSIS — M8580 Other specified disorders of bone density and structure, unspecified site: Secondary | ICD-10-CM | POA: Diagnosis not present

## 2023-03-25 DIAGNOSIS — S32511A Fracture of superior rim of right pubis, initial encounter for closed fracture: Secondary | ICD-10-CM | POA: Diagnosis not present

## 2023-03-25 DIAGNOSIS — I129 Hypertensive chronic kidney disease with stage 1 through stage 4 chronic kidney disease, or unspecified chronic kidney disease: Secondary | ICD-10-CM | POA: Diagnosis not present

## 2023-03-25 DIAGNOSIS — R41841 Cognitive communication deficit: Secondary | ICD-10-CM | POA: Diagnosis not present

## 2023-03-25 DIAGNOSIS — Z886 Allergy status to analgesic agent status: Secondary | ICD-10-CM | POA: Diagnosis not present

## 2023-03-25 DIAGNOSIS — R0902 Hypoxemia: Secondary | ICD-10-CM | POA: Diagnosis not present

## 2023-03-25 DIAGNOSIS — J9811 Atelectasis: Secondary | ICD-10-CM | POA: Diagnosis not present

## 2023-03-25 DIAGNOSIS — E871 Hypo-osmolality and hyponatremia: Secondary | ICD-10-CM | POA: Diagnosis not present

## 2023-03-25 DIAGNOSIS — F339 Major depressive disorder, recurrent, unspecified: Secondary | ICD-10-CM | POA: Diagnosis not present

## 2023-03-25 DIAGNOSIS — W19XXXA Unspecified fall, initial encounter: Secondary | ICD-10-CM | POA: Diagnosis not present

## 2023-03-25 DIAGNOSIS — S72001A Fracture of unspecified part of neck of right femur, initial encounter for closed fracture: Secondary | ICD-10-CM | POA: Diagnosis not present

## 2023-03-25 DIAGNOSIS — R262 Difficulty in walking, not elsewhere classified: Secondary | ICD-10-CM | POA: Diagnosis not present

## 2023-03-25 DIAGNOSIS — Z743 Need for continuous supervision: Secondary | ICD-10-CM | POA: Diagnosis not present

## 2023-03-25 DIAGNOSIS — Z7984 Long term (current) use of oral hypoglycemic drugs: Secondary | ICD-10-CM

## 2023-03-25 DIAGNOSIS — N179 Acute kidney failure, unspecified: Secondary | ICD-10-CM | POA: Diagnosis not present

## 2023-03-25 DIAGNOSIS — Z96641 Presence of right artificial hip joint: Secondary | ICD-10-CM | POA: Diagnosis not present

## 2023-03-25 DIAGNOSIS — Y92009 Unspecified place in unspecified non-institutional (private) residence as the place of occurrence of the external cause: Secondary | ICD-10-CM

## 2023-03-25 DIAGNOSIS — Z8616 Personal history of COVID-19: Secondary | ICD-10-CM

## 2023-03-25 DIAGNOSIS — S72001D Fracture of unspecified part of neck of right femur, subsequent encounter for closed fracture with routine healing: Secondary | ICD-10-CM | POA: Diagnosis not present

## 2023-03-25 DIAGNOSIS — Z471 Aftercare following joint replacement surgery: Secondary | ICD-10-CM | POA: Diagnosis not present

## 2023-03-25 DIAGNOSIS — K219 Gastro-esophageal reflux disease without esophagitis: Secondary | ICD-10-CM | POA: Diagnosis not present

## 2023-03-25 DIAGNOSIS — M109 Gout, unspecified: Secondary | ICD-10-CM | POA: Diagnosis not present

## 2023-03-25 DIAGNOSIS — R600 Localized edema: Secondary | ICD-10-CM | POA: Diagnosis not present

## 2023-03-25 HISTORY — DX: Chronic myelomonocytic leukemia not having achieved remission: C93.10

## 2023-03-25 HISTORY — DX: Other ascites: R18.8

## 2023-03-25 LAB — CBC WITH DIFFERENTIAL/PLATELET
Abs Immature Granulocytes: 0 10*3/uL (ref 0.00–0.07)
Basophils Absolute: 0 10*3/uL (ref 0.0–0.1)
Basophils Relative: 1 %
Eosinophils Absolute: 0 10*3/uL (ref 0.0–0.5)
Eosinophils Relative: 1 %
HCT: 29.4 % — ABNORMAL LOW (ref 36.0–46.0)
Hemoglobin: 9.6 g/dL — ABNORMAL LOW (ref 12.0–15.0)
Lymphocytes Relative: 8 %
Lymphs Abs: 0.3 10*3/uL — ABNORMAL LOW (ref 0.7–4.0)
MCH: 30.6 pg (ref 26.0–34.0)
MCHC: 32.7 g/dL (ref 30.0–36.0)
MCV: 93.6 fL (ref 80.0–100.0)
Monocytes Absolute: 0.5 10*3/uL (ref 0.1–1.0)
Monocytes Relative: 13 %
Neutro Abs: 3.1 10*3/uL (ref 1.7–7.7)
Neutrophils Relative %: 77 %
Platelets: 99 10*3/uL — ABNORMAL LOW (ref 150–400)
RBC: 3.14 MIL/uL — ABNORMAL LOW (ref 3.87–5.11)
RDW: 19.8 % — ABNORMAL HIGH (ref 11.5–15.5)
WBC: 4 10*3/uL (ref 4.0–10.5)
nRBC: 0 % (ref 0.0–0.2)

## 2023-03-25 LAB — BASIC METABOLIC PANEL
Anion gap: 5 (ref 5–15)
BUN: 20 mg/dL (ref 8–23)
CO2: 24 mmol/L (ref 22–32)
Calcium: 7.3 mg/dL — ABNORMAL LOW (ref 8.9–10.3)
Chloride: 106 mmol/L (ref 98–111)
Creatinine, Ser: 0.89 mg/dL (ref 0.44–1.00)
GFR, Estimated: >60 mL/min (ref >60)
Glucose, Bld: 123 mg/dL — ABNORMAL HIGH (ref 70–99)
Potassium: 3.8 mmol/L (ref 3.5–5.1)
Sodium: 135 mmol/L (ref 135–145)

## 2023-03-25 LAB — CULTURE, BLOOD (SINGLE)
Culture: NO GROWTH
Culture: NO GROWTH

## 2023-03-25 LAB — GLUCOSE, CAPILLARY
Glucose-Capillary: 150 mg/dL — ABNORMAL HIGH (ref 70–99)
Glucose-Capillary: 86 mg/dL (ref 70–99)

## 2023-03-25 MED ORDER — METOPROLOL SUCCINATE ER 25 MG PO TB24
25.0000 mg | ORAL_TABLET | Freq: Every day | ORAL | Status: DC
  Administered 2023-03-26 – 2023-03-30 (×4): 25 mg via ORAL
  Filled 2023-03-25 (×6): qty 1

## 2023-03-25 MED ORDER — METHOCARBAMOL 500 MG PO TABS
500.0000 mg | ORAL_TABLET | Freq: Four times a day (QID) | ORAL | Status: DC | PRN
  Administered 2023-03-25: 500 mg via ORAL
  Filled 2023-03-25: qty 1

## 2023-03-25 MED ORDER — OXYCODONE HCL 5 MG PO TABS
5.0000 mg | ORAL_TABLET | ORAL | Status: DC | PRN
  Administered 2023-03-25 – 2023-03-26 (×3): 10 mg via ORAL
  Filled 2023-03-25 (×3): qty 2

## 2023-03-25 MED ORDER — HYDROMORPHONE HCL 1 MG/ML IJ SOLN
0.5000 mg | INTRAMUSCULAR | Status: DC | PRN
  Administered 2023-03-25 – 2023-03-26 (×2): 0.5 mg via INTRAVENOUS
  Filled 2023-03-25 (×2): qty 0.5

## 2023-03-25 MED ORDER — SENNOSIDES-DOCUSATE SODIUM 8.6-50 MG PO TABS: 1.0000 | ORAL_TABLET | Freq: Every evening | ORAL | Status: DC | PRN

## 2023-03-25 MED ORDER — INSULIN ASPART 100 UNIT/ML IJ SOLN
0.0000 [IU] | INTRAMUSCULAR | Status: DC
  Filled 2023-03-25: qty 0.06

## 2023-03-25 MED ORDER — METHOCARBAMOL 1000 MG/10ML IJ SOLN: 500.0000 mg | Freq: Four times a day (QID) | INTRAVENOUS | Status: DC | PRN

## 2023-03-25 MED ORDER — CIPROFLOXACIN HCL 500 MG PO TABS
250.0000 mg | ORAL_TABLET | Freq: Two times a day (BID) | ORAL | Status: AC
  Administered 2023-03-25 – 2023-03-26 (×3): 250 mg via ORAL
  Filled 2023-03-25 (×3): qty 1

## 2023-03-25 MED ORDER — ONDANSETRON HCL 4 MG/2ML IJ SOLN
4.0000 mg | Freq: Four times a day (QID) | INTRAMUSCULAR | Status: DC | PRN
  Administered 2023-03-26 (×2): 4 mg via INTRAVENOUS
  Filled 2023-03-25 (×2): qty 2

## 2023-03-25 MED ORDER — PAROXETINE HCL 10 MG PO TABS
10.0000 mg | ORAL_TABLET | Freq: Every day | ORAL | Status: DC
  Administered 2023-03-26 – 2023-03-31 (×6): 10 mg via ORAL
  Filled 2023-03-25 (×6): qty 1

## 2023-03-25 MED ORDER — PANTOPRAZOLE SODIUM 40 MG PO TBEC
40.0000 mg | DELAYED_RELEASE_TABLET | Freq: Every day | ORAL | Status: DC
  Administered 2023-03-26 – 2023-03-31 (×6): 40 mg via ORAL
  Filled 2023-03-25 (×6): qty 1

## 2023-03-25 NOTE — ED Notes (Signed)
Pt assigned room 1340.  No purple man.  Called third floor and Salli Quarry will be taking the pt per Diplomatic Services operational officer.  I tried to give report and they wanted a call back in 10 min

## 2023-03-25 NOTE — ED Triage Notes (Signed)
Patient presents from home due to a mechanical fall at home while trying to get to her bathroom.  Home. She landed on her right hip and has since experienced pain in which EMS administer 25 mcg of Fentanyl for. EMS noted shortening and external rotation. Patient uses a walker to ambulate and is not currently on blood thinners.    EMS vitals: 126/72 BP 100 HR 99% SPO2 on room air

## 2023-03-25 NOTE — ED Provider Notes (Signed)
[place] EMERGENCY DEPARTMENT AT [facility] Provider Note   CSN: [id number] Arrival date & time: 03/25/23  1536     History  Chief Complaint  Patient presents with   Kathleen Gibson is a 72 y.o. female.  71 year old female presents had mechanical fall just prior to arrival.  Patient states that she also balance and fell onto her right hip.  Struck her head but did not lose consciousness.  Does not take any blood thinners per denies any headache, nausea, neck pain.  Complains of sharp pain to her right hip.  Unable to stand.  Does use a walker at baseline.  Denies any distal numbness or tingling to her right foot.  Called EMS and was transported here       Home Medications Prior to Admission medications   Medication Sig Start Date End Date Taking? Authorizing Provider  allopurinol (ZYLOPRIM) 300 MG tablet Take by mouth. 02/28/23   [provider]  ciprofloxacin (CIPRO) 250 MG tablet Take 1 tablet (250 mg total) by mouth 2 (two) times daily for 3 days. 03/23/23 03/26/23  Pollyann Samples, NP  cyclobenzaprine (FLEXERIL) 5 MG tablet Take 1 tablet (5 mg total) by mouth 3 (three) times daily as needed for muscle spasms. 12/06/21   Malachy Mood, MD  fluocinonide cream (LIDEX) 0.05 % Apply 1 Application topically 2 (two) times daily. 12/27/22   Kuneff, Renee A, DO  furosemide (LASIX) 20 MG tablet TAKE 1 TABLET BY MOUTH EVERY DAY FOR UP TO 7 DAYS THEN AS NEEDED FOR LEG EDEMA 03/22/22   Malachy Mood, MD  furosemide (LASIX) 20 MG tablet Take 1 tablet (20 mg total) by mouth daily as needed. 12/21/22   Malachy Mood, MD  glucose blood (ONETOUCH VERIO) test strip Monitor blood sugars twice daily 09/08/21   Kuneff, Renee A, DO  hydrOXYzine (ATARAX) 10 MG tablet Take 10 mg by mouth every 8 (eight) hours as needed.    [provider]  KLOR-CON M10 10 MEQ tablet TAKE 1 TABLET BY MOUTH TWICE A DAY TAKE ALONG WITH FUROSEMIDE ONLY 03/22/22   Malachy Mood, MD  lactulose (CHRONULAC) 10  GM/15ML solution Take 30 mLs (20 g total) by mouth 2 (two) times daily as needed for mild constipation. As needed to maintain bowel movement 1-3 times a day 03/24/23   Pollyann Samples, NP  Lancets [facility] ULTRASOFT) lancets Monitor blood sugars twice daily 09/08/21   Kuneff, Renee A, DO  lidocaine (HM LIDOCAINE PATCH) 4 % Place 2 patches onto the skin daily. 09/19/22   Mardene Sayer, MD  lidocaine-prilocaine (EMLA) cream APPLY 2 GRAMS TO PORT-A-CATH SITE 30-60 MINUTES PRIOR TO PORT ACCESS AS NEEDED 11/08/22   Pollyann Samples, NP  LORazepam (ATIVAN) 1 MG tablet TAKE 1 TABLET BY MOUTH EVERY 8 HOURS AS NEEDED FOR ANXIETY 11/09/22   Malachy Mood, MD  metoprolol succinate (TOPROL-XL) 25 MG 24 hr tablet Take 1 tablet (25 mg total) by mouth daily. 12/27/22   Kuneff, Renee A, DO  naloxone (NARCAN) nasal spray 4 mg/0.1 mL Place 1 spray into the nose once as needed (overdose). 03/12/21   [name], Werner Lean, MD  omeprazole (PRILOSEC) 20 MG capsule Take 20 mg by mouth daily. 08/15/22   [provider]  ondansetron (ZOFRAN-ODT) 8 MG disintegrating tablet Take 0.5 tablets (4 mg total) by mouth every 8 (eight) hours as needed for nausea or vomiting. 12/07/22   Pollyann Samples, NP  Oxycodone HCl 10  MG TABS Take 1 tablet (10 mg total) by mouth 2 (two) times daily as needed. 12/27/22   Kuneff, Renee A, DO  Oxycodone HCl 10 MG TABS Take 1 tablet (10 mg total) by mouth 2 (two) times daily as needed. 01/24/23   Kuneff, Renee A, DO  Oxycodone HCl 10 MG TABS Take 1 tablet (10 mg total) by mouth 2 (two) times daily as needed. 02/21/23   Kuneff, Renee A, DO  PARoxetine (PAXIL) 10 MG tablet Take by mouth. 11/08/22   [provider]  polyethylene glycol powder (GLYCOLAX/MIRALAX) 17 GM/SCOOP powder Dissolve 1 capful (17 g) in water and drink 2 (two) times daily. Patient taking differently: Take 17 g by mouth daily. 03/26/21   Kuneff, Renee A, DO  pregabalin (LYRICA) 150 MG capsule Take 1 capsule (150 mg total) by mouth  2 (two) times daily. 12/27/22   Kuneff, Renee A, DO  prochlorperazine (COMPAZINE) 10 MG tablet Take 1 tablet (10 mg total) by mouth every 6 (six) hours as needed. 12/07/22   Pollyann Samples, NP  promethazine-dextromethorphan (PROMETHAZINE-DM) 6.25-15 MG/5ML syrup Take 5 mLs by mouth 4 (four) times daily as needed. 05/16/22   Lowne Chase, Yvonne R, DO  SENEXON-S 8.6-50 MG tablet TAKE 2 TABLETS BY MOUTH AT BEDTIME AS NEEDED FOR MILD CONSTIPATION. 12/16/22   Malachy Mood, MD  sitaGLIPtin (JANUVIA) 50 MG tablet Take 1 tablet (50 mg total) by mouth daily. 12/27/22   Kuneff, Renee A, DO  triamcinolone ointment (KENALOG) 0.5 % Apply 1 Application topically 2 (two) times daily. 01/09/23   Malachy Mood, MD      Allergies    Asa [aspirin], Nsaids, and Red blood cells    Review of Systems   Review of Systems  All other systems reviewed and are negative.   Physical Exam Updated Vital Signs There were no vitals taken for this visit. Physical Exam Vitals and nursing note reviewed.  Constitutional:      General: She is not in acute distress.    Appearance: Normal appearance. She is well-developed. She is not toxic-appearing.  HENT:     Head: Normocephalic and atraumatic.  Eyes:     General: Lids are normal.     Conjunctiva/sclera: Conjunctivae normal.     Pupils: Pupils are equal, round, and reactive to light.  Neck:     Thyroid: No thyroid mass.     Trachea: No tracheal deviation.  Cardiovascular:     Rate and Rhythm: Normal rate and regular rhythm.     Heart sounds: Normal heart sounds. No murmur heard.    No gallop.  Pulmonary:     Effort: Pulmonary effort is normal. No respiratory distress.     Breath sounds: Normal breath sounds. No stridor. No decreased breath sounds, wheezing, rhonchi or rales.  Abdominal:     General: There is no distension.     Palpations: Abdomen is soft.     Tenderness: There is no abdominal tenderness. There is no rebound.  Musculoskeletal:        General: No tenderness.  Normal range of motion.     Cervical back: Normal range of motion and neck supple.     Comments: Right lower extremity shortened and rotated.  Neurovascular status intact at right foot  Skin:    General: Skin is warm and dry.     Findings: No abrasion or rash.  Neurological:     Mental Status: She is alert and oriented to person, place, and time. Mental status is  at baseline.     GCS: GCS eye subscore is 4. GCS verbal subscore is 5. GCS motor subscore is 6.     Cranial Nerves: Cranial nerves are intact. No cranial nerve deficit.     Sensory: No sensory deficit.     Motor: Motor function is intact.  Psychiatric:        Attention and Perception: Attention normal.        Speech: Speech normal.        Behavior: Behavior normal.     ED Results / Procedures / Treatments   Labs (all labs ordered are listed, but only abnormal results are displayed) Labs Reviewed  CBC WITH DIFFERENTIAL/PLATELET  BASIC METABOLIC PANEL    EKG None  Radiology No results found.  Procedures Procedures    Medications Ordered in ED Medications - No data to display  ED Course/ Medical Decision Making/ A&P                                 Medical Decision Making Amount and/or Complexity of Data Reviewed Labs: ordered. Radiology: ordered.   Patient is right hip x-ray consistent with nondisplaced subcapital right femoral neck fracture.  Pain is controlled here at this time.  Labs are reassuring.  Will consult orthopedist on-call and admit to the medicine service.  Patient updated        Final Clinical Impression(s) / ED Diagnoses Final diagnoses:  None    Rx / DC Orders ED Discharge Orders     None         Lorre Nick, MD 03/25/23 1738

## 2023-03-25 NOTE — Consult Note (Signed)
Reason for Consult:Right hip fracture Referring Physician: EDP  Kathleen Gibson is an 72 y.o. female.   HPI: Larey Seat today.. No LOC.Right hip pain  Past Medical History:  Diagnosis Date   Acute bronchitis due to COVID-19 virus 05/16/2022   Diabetes (HCC)    HLD (hyperlipidemia)    Hypertension    Pernicious anemia    Pneumonia 09/05/2019   Renal insufficiency 09/06/2019   Skin rash 12/21/2022   Vitamin D deficiency     Past Surgical History:  Procedure Laterality Date   ABDOMINAL HERNIA REPAIR  2009   ABDOMINOPLASTY     AXILLARY LYMPH NODE BIOPSY Left 11/11/2020   Procedure: LEFT AXILLARY EXCISIONAL LYMPH NODE BIOPSY;  Surgeon: Darnell Level, MD;  Location: [facility] OR;  Service: General;  Laterality: Left;   CESAREAN SECTION  1978   GASTRIC BYPASS  2000   hemorroid surgery  2007   IR IMAGING GUIDED PORT INSERTION  07/26/2021   IR KYPHO EA ADDL LEVEL THORACIC OR LUMBAR  05/31/2021   IR KYPHO THORACIC WITH BONE BIOPSY  05/31/2021   IR PARACENTESIS  03/15/2023   IR PARACENTESIS  03/20/2023   TONSILLECTOMY AND ADENOIDECTOMY  1957    Family History  Problem Relation Age of Onset   Cancer Mother 88       unknown type cancer    Hypertension Mother    Hypertension Father    Heart failure Father    Pneumonia Father    Diabetes Sister    Leukemia Brother    Colon cancer Brother 56   Heart Problems Brother    Leukemia Other    Diabetes Sister    Heart Problems Sister    Cancer Brother    Heart Problems Brother     Social History:  reports that she has never smoked. She has never been exposed to tobacco smoke. She has never used smokeless tobacco. She reports that she does not drink alcohol and does not use drugs.  Allergies:  Allergies  Allergen Reactions   Asa [Aspirin] Other (See Comments)    Contraindicated d/t low plts   Nsaids Other (See Comments)    Contraindicated d/t low plts   Red Blood Cells Nausea Only and Other (See Comments)    Near end of transfusion pt developed  nausea, facial flushing, and tachycardia. See progress note from 03/01/22    Medications: I have reviewed the patient's current medications.  Results for orders placed or performed during the hospital encounter of 03/25/23 (from the past 48 hour(s))  CBC with Differential/Platelet     Status: Abnormal   Collection Time: 03/25/23  4:48 PM  Result Value Ref Range   WBC 4.0 4.0 - 10.5 K/uL   RBC 3.14 (L) 3.87 - 5.11 MIL/uL   Hemoglobin 9.6 (L) 12.0 - 15.0 g/dL   HCT 16.1 (L) 09.6 - 04.5 %   MCV 93.6 80.0 - 100.0 fL   MCH 30.6 26.0 - 34.0 pg   MCHC 32.7 30.0 - 36.0 g/dL   RDW 40.9 (H) 81.1 - 91.4 %   Platelets 99 (L) 150 - 400 K/uL    Comment: SPECIMEN CHECKED FOR CLOTS REPEATED TO VERIFY    nRBC 0.0 0.0 - 0.2 %   Neutrophils Relative % 77 %   Neutro Abs 3.1 1.7 - 7.7 K/uL   Lymphocytes Relative 8 %   Lymphs Abs 0.3 (L) 0.7 - 4.0 K/uL   Monocytes Relative 13 %   Monocytes Absolute 0.5 0.1 - 1.0 K/uL  Eosinophils Relative 1 %   Eosinophils Absolute 0.0 0.0 - 0.5 K/uL   Basophils Relative 1 %   Basophils Absolute 0.0 0.0 - 0.1 K/uL   WBC Morphology Mild Left Shift (1-5% metas, occ myelo)    Abs Immature Granulocytes 0.00 0.00 - 0.07 K/uL    Comment: Performed at [facility], [address] [place], [place] [address]  Basic metabolic panel     Status: Abnormal   Collection Time: 03/25/23  4:48 PM  Result Value Ref Range   Sodium 135 135 - 145 mmol/L   Potassium 3.8 3.5 - 5.1 mmol/L   Chloride 106 98 - 111 mmol/L   CO2 24 22 - 32 mmol/L   Glucose, Bld 123 (H) 70 - 99 mg/dL    Comment: Glucose reference range applies only to samples taken after fasting for at least 8 hours.   BUN 20 8 - 23 mg/dL   Creatinine, Ser 6.04 0.44 - 1.00 mg/dL   Calcium 7.3 (L) 8.9 - 10.3 mg/dL   GFR, Estimated >54 >09 mL/min    Comment: (NOTE) Calculated using the CKD-EPI Creatinine Equation (2021)    Anion gap 5 5 - 15    Comment: Performed at [facility],  [address], [place], [place] [address]   *Note: Due to a large number of results and/or encounters for the requested time period, some results have not been displayed. A complete set of results can be found in Results Review.    DG Hip Unilat W or Wo Pelvis 2-3 Views Right  Result Date: 03/25/2023 CLINICAL DATA:  Fall EXAM: DG HIP (WITH OR WITHOUT PELVIS) 2-3V RIGHT COMPARISON:  None Available. FINDINGS: Subcapital right femoral neck fracture is present, nondisplaced. There is no dislocation. Bones are diffusely osteopenic. Joint spaces are maintained. Peripheral vascular calcifications are present. IMPRESSION: Nondisplaced subcapital right femoral neck fracture. Electronically Signed   By: Darliss Cheney M.D.   On: 03/25/2023 17:21    Review of Systems  Constitutional: Negative.   Musculoskeletal:  Positive for falls and joint pain.       Pain right hip with motion or weight bearing  All other systems reviewed and are negative.  Blood pressure 126/78, pulse (!) 106, temperature 98.3 F (36.8 C), temperature source Oral, resp. rate 16, SpO2 98%. Physical Exam Vitals reviewed.  Constitutional:      Appearance: Normal appearance.  HENT:     Head: Normocephalic and atraumatic.  Eyes:     Pupils: Pupils are equal, round, and reactive to light.  Cardiovascular:     Rate and Rhythm: Normal rate.     Pulses: Normal pulses.  Pulmonary:     Effort: Pulmonary effort is normal.  Abdominal:     General: Abdomen is flat.     Palpations: Abdomen is soft.  Musculoskeletal:        General: Tenderness present.     Comments: Pain right hip with minimal rotation. No DVT  Skin:    General: Skin is warm and dry.     Capillary Refill: Capillary refill takes less than 2 seconds.  Neurological:     General: No focal deficit present.     Mental Status: She is alert.     Assessment/Plan:  Mildly displaced right femoral neck fracture. Will require operative fixation vs arthroplasty pending  preoperative clearance.  Discussed with patient. NPO after MN in preparation Preoperative clearance Consulted Dr. Charlann Boxer for anticipated surgery.  He will decide in the morning.  CML, Anemia, recent UTI. Hgb 9.6.  Patient has recently discontinued her chemotherapy.  Javier Docker 03/25/2023, 6:17 PM

## 2023-03-25 NOTE — H&P (Addendum)
History and Physical    Kathleen Gibson:[id number] DOB: 1950/08/01 DOA: 03/25/2023  PCP: Natalia Leatherwood, DO   Patient coming from: Home   Chief Complaint: Fall, right hip pain   HPI: Kathleen Gibson is a pleasant 72 y.o. female with medical history significant for hypertension, hyperlipidemia, type 2 diabetes mellitus, and chronic myelomonocytic leukemia who recently discontinued treatment for the [name] and has been referred to hospice and now presents with right hip pain after fall.  Patient is currently on an antibiotic for UTI but was feeling well this morning, went out to breakfast with family, but then tripped and fell onto her right side while getting out of bed back at home.  She has been experiencing severe right hip pain since then.  She did not hit her head or lose consciousness.  No recent chest pain or shortness of breath.  She denies abdominal pain.  ED Course: Upon arrival to the ED, patient is found to be afebrile and saturating well on room air with mild tachycardia and stable blood pressure.  Labs are most notable for hemoglobin 9.6 and platelets 99,000.  Plain film of the right hip demonstrates nondisplaced subcapital right femoral neck fracture.  Orthopedic surgery (Dr. Shelle Iron) was consulted by the ED physician.  Review of Systems:  All other systems reviewed and apart from HPI, are negative.  Past Medical History:  Diagnosis Date   Acute bronchitis due to COVID-19 virus 05/16/2022   Diabetes (HCC)    HLD (hyperlipidemia)    Hypertension    Pernicious anemia    Pneumonia 09/05/2019   Renal insufficiency 09/06/2019   Skin rash 12/21/2022   Vitamin D deficiency     Past Surgical History:  Procedure Laterality Date   ABDOMINAL HERNIA REPAIR  2009   ABDOMINOPLASTY     AXILLARY LYMPH NODE BIOPSY Left 11/11/2020   Procedure: LEFT AXILLARY EXCISIONAL LYMPH NODE BIOPSY;  Surgeon: Darnell Level, MD;  Location: [facility];  Service: General;  Laterality: Left;   CESAREAN SECTION   1978   GASTRIC BYPASS  2000   hemorroid surgery  2007   IR IMAGING GUIDED PORT INSERTION  07/26/2021   IR KYPHO EA ADDL LEVEL THORACIC OR LUMBAR  05/31/2021   IR KYPHO THORACIC WITH BONE BIOPSY  05/31/2021   IR PARACENTESIS  03/15/2023   IR PARACENTESIS  03/20/2023   TONSILLECTOMY AND ADENOIDECTOMY  1957    Social History:   reports that she has never smoked. She has never been exposed to tobacco smoke. She has never used smokeless tobacco. She reports that she does not drink alcohol and does not use drugs.  Allergies  Allergen Reactions   Asa [Aspirin] Other (See Comments)    Contraindicated d/t low plts   Nsaids Other (See Comments)    Contraindicated d/t low plts   Red Blood Cells Nausea Only and Other (See Comments)    Near end of transfusion pt developed nausea, facial flushing, and tachycardia. See progress note from 03/01/22    Family History  Problem Relation Age of Onset   Cancer Mother 2       unknown type cancer    Hypertension Mother    Hypertension Father    Heart failure Father    Pneumonia Father    Diabetes Sister    Leukemia Brother    Colon cancer Brother 56   Heart Problems Brother    Leukemia Other    Diabetes Sister    Heart Problems Sister  Cancer Brother    Heart Problems Brother      Prior to Admission medications   Medication Sig Start Date End Date Taking? Authorizing Provider  allopurinol (ZYLOPRIM) 300 MG tablet Take by mouth. 02/28/23   [provider]  ciprofloxacin (CIPRO) 250 MG tablet Take 1 tablet (250 mg total) by mouth 2 (two) times daily for 3 days. 03/23/23 03/26/23  Pollyann Samples, NP  cyclobenzaprine (FLEXERIL) 5 MG tablet Take 1 tablet (5 mg total) by mouth 3 (three) times daily as needed for muscle spasms. 12/06/21   Malachy Mood, MD  fluocinonide cream (LIDEX) 0.05 % Apply 1 Application topically 2 (two) times daily. 12/27/22   Kuneff, Renee A, DO  furosemide (LASIX) 20 MG tablet TAKE 1 TABLET BY MOUTH EVERY DAY FOR UP  TO 7 DAYS THEN AS NEEDED FOR LEG EDEMA 03/22/22   Malachy Mood, MD  furosemide (LASIX) 20 MG tablet Take 1 tablet (20 mg total) by mouth daily as needed. 12/21/22   Malachy Mood, MD  glucose blood (ONETOUCH VERIO) test strip Monitor blood sugars twice daily 09/08/21   Kuneff, Renee A, DO  hydrOXYzine (ATARAX) 10 MG tablet Take 10 mg by mouth every 8 (eight) hours as needed.    [provider]  KLOR-CON M10 10 MEQ tablet TAKE 1 TABLET BY MOUTH TWICE A DAY TAKE ALONG WITH FUROSEMIDE ONLY 03/22/22   Malachy Mood, MD  lactulose (CHRONULAC) 10 GM/15ML solution Take 30 mLs (20 g total) by mouth 2 (two) times daily as needed for mild constipation. As needed to maintain bowel movement 1-3 times a day 03/24/23   Pollyann Samples, NP  Lancets [facility] ULTRASOFT) lancets Monitor blood sugars twice daily 09/08/21   Kuneff, Renee A, DO  lidocaine (HM LIDOCAINE PATCH) 4 % Place 2 patches onto the skin daily. 09/19/22   Mardene Sayer, MD  lidocaine-prilocaine (EMLA) cream APPLY 2 GRAMS TO PORT-A-CATH SITE 30-60 MINUTES PRIOR TO PORT ACCESS AS NEEDED 11/08/22   Pollyann Samples, NP  LORazepam (ATIVAN) 1 MG tablet TAKE 1 TABLET BY MOUTH EVERY 8 HOURS AS NEEDED FOR ANXIETY 11/09/22   Malachy Mood, MD  metoprolol succinate (TOPROL-XL) 25 MG 24 hr tablet Take 1 tablet (25 mg total) by mouth daily. 12/27/22   Kuneff, Renee A, DO  naloxone (NARCAN) nasal spray 4 mg/0.1 mL Place 1 spray into the nose once as needed (overdose). 03/12/21   [name], Werner Lean, MD  omeprazole (PRILOSEC) 20 MG capsule Take 20 mg by mouth daily. 08/15/22   [provider]  ondansetron (ZOFRAN-ODT) 8 MG disintegrating tablet Take 0.5 tablets (4 mg total) by mouth every 8 (eight) hours as needed for nausea or vomiting. 12/07/22   Pollyann Samples, NP  Oxycodone HCl 10 MG TABS Take 1 tablet (10 mg total) by mouth 2 (two) times daily as needed. 12/27/22   Kuneff, Renee A, DO  Oxycodone HCl 10 MG TABS Take 1 tablet (10 mg total) by mouth 2 (two) times  daily as needed. 01/24/23   Kuneff, Renee A, DO  Oxycodone HCl 10 MG TABS Take 1 tablet (10 mg total) by mouth 2 (two) times daily as needed. 02/21/23   Kuneff, Renee A, DO  PARoxetine (PAXIL) 10 MG tablet Take by mouth. 11/08/22   [provider]  polyethylene glycol powder (GLYCOLAX/MIRALAX) 17 GM/SCOOP powder Dissolve 1 capful (17 g) in water and drink 2 (two) times daily. Patient taking differently: Take 17 g by mouth daily. 03/26/21   Kuneff,  Renee A, DO  pregabalin (LYRICA) 150 MG capsule Take 1 capsule (150 mg total) by mouth 2 (two) times daily. 12/27/22   Kuneff, Renee A, DO  prochlorperazine (COMPAZINE) 10 MG tablet Take 1 tablet (10 mg total) by mouth every 6 (six) hours as needed. 12/07/22   Pollyann Samples, NP  promethazine-dextromethorphan (PROMETHAZINE-DM) 6.25-15 MG/5ML syrup Take 5 mLs by mouth 4 (four) times daily as needed. 05/16/22   Lowne Chase, Yvonne R, DO  SENEXON-S 8.6-50 MG tablet TAKE 2 TABLETS BY MOUTH AT BEDTIME AS NEEDED FOR MILD CONSTIPATION. 12/16/22   Malachy Mood, MD  sitaGLIPtin (JANUVIA) 50 MG tablet Take 1 tablet (50 mg total) by mouth daily. 12/27/22   Kuneff, Renee A, DO  triamcinolone ointment (KENALOG) 0.5 % Apply 1 Application topically 2 (two) times daily. 01/09/23   Malachy Mood, MD    Physical Exam: Vitals:   03/25/23 1550  BP: 126/78  Pulse: (!) 106  Resp: 16  Temp: 98.3 F (36.8 C)  TempSrc: Oral  SpO2: 98%    Constitutional: NAD, calm  Eyes: PERTLA, lids and conjunctivae normal ENMT: Mucous membranes are moist. Posterior pharynx clear of any exudate or lesions.   Neck: supple, no masses  Respiratory:  no wheezing, no crackles. No accessory muscle use.  Cardiovascular: S1 & S2 heard, regular rate and rhythm. Pretibial pitting edema bilaterally.   Abdomen: Distended, soft, no tenderness. Bowel sounds active.  Musculoskeletal: no clubbing / cyanosis. Right hip tenderness, neurovascularly intact.   Skin: no significant rashes, lesions, ulcers.  Warm, dry, well-perfused. Neurologic: CN 2-12 grossly intact. Moving all extremities. Alert and oriented.  Psychiatric: Pleasant. Cooperative.    Labs and Imaging on Admission: I have personally reviewed following labs and imaging studies  CBC: Recent Labs  Lab 03/20/23 0816 03/25/23 1648  WBC 7.3 4.0  NEUTROABS 5.0 3.1  HGB 8.1* 9.6*  HCT 25.0* 29.4*  MCV 94.0 93.6  PLT 145* 99*   Basic Metabolic Panel: Recent Labs  Lab 03/20/23 0816 03/25/23 1648  NA 136 135  K 4.1 3.8  CL 107 106  CO2 26 24  GLUCOSE 187* 123*  BUN 17 20  CREATININE 0.95 0.89  CALCIUM 7.7* 7.3*   GFR: Estimated Creatinine Clearance: 54.3 mL/min (by C-G formula based on SCr of 0.89 mg/dL). Liver Function Tests: No results for input(s): "AST", "ALT", "ALKPHOS", "BILITOT", "PROT", "ALBUMIN" in the last 168 hours. No results for input(s): "LIPASE", "AMYLASE" in the last 168 hours. No results for input(s): "AMMONIA" in the last 168 hours. Coagulation Profile: No results for input(s): "INR", "PROTIME" in the last 168 hours. Cardiac Enzymes: No results for input(s): "CKTOTAL", "CKMB", "CKMBINDEX", "TROPONINI" in the last 168 hours. BNP (last 3 results) No results for input(s): "PROBNP" in the last 8760 hours. HbA1C: No results for input(s): "HGBA1C" in the last 72 hours. CBG: No results for input(s): "GLUCAP" in the last 168 hours. Lipid Profile: No results for input(s): "CHOL", "HDL", "LDLCALC", "TRIG", "CHOLHDL", "LDLDIRECT" in the last 72 hours. Thyroid Function Tests: No results for input(s): "TSH", "T4TOTAL", "FREET4", "T3FREE", "THYROIDAB" in the last 72 hours. Anemia Panel: No results for input(s): "VITAMINB12", "FOLATE", "FERRITIN", "TIBC", "IRON", "RETICCTPCT" in the last 72 hours. Urine analysis:    Component Value Date/Time   COLORURINE YELLOW 03/20/2023 1047   APPEARANCEUR TURBID (A) 03/20/2023 1047   LABSPEC 1.025 03/20/2023 1047   PHURINE 5.0 03/20/2023 1047   GLUCOSEU NEGATIVE  03/20/2023 1047   HGBUR NEGATIVE 03/20/2023 1047   BILIRUBINUR NEGATIVE 03/20/2023  1047   KETONESUR NEGATIVE 03/20/2023 1047   PROTEINUR 30 (A) 03/20/2023 1047   NITRITE NEGATIVE 03/20/2023 1047   LEUKOCYTESUR MODERATE (A) 03/20/2023 1047   Sepsis Labs: @LABRCNTIP (procalcitonin:4,lacticidven:4) ) Recent Results (from the past 240 hour(s))  Culture, Blood     Status: None   Collection Time: 03/20/23 10:20 AM   Specimen: Left Antecubital; Blood  Result Value Ref Range Status   Specimen Description   Final    LEFT ANTECUBITAL Performed at [facility], [address] [place], [place] [address]    Special Requests   Final    NONE Performed at [facility], [address] [place], [place] [address]    Culture   Final    NO GROWTH 5 DAYS Performed at [facility], [address] [place], [place] [address]    Report Status 03/25/2023 FINAL  Final  Culture, Blood     Status: None   Collection Time: 03/20/23 10:20 AM   Specimen: Right Antecubital; Blood  Result Value Ref Range Status   Specimen Description   Final    RIGHT ANTECUBITAL Performed at [facility], [address], [place], [place] [address]    Special Requests   Final    NONE Performed at [facility], [address], [place], [place] [address]    Culture   Final    NO GROWTH 5 DAYS Performed at [facility], [address], [place], [place] [address]    Report Status 03/25/2023 FINAL  Final  Urine Culture     Status: Abnormal   Collection Time: 03/20/23 10:47 AM   Specimen: Urine, Clean Catch  Result Value Ref Range Status   Specimen Description   Final    URINE, CLEAN CATCH Performed at [facility], [address], [place], [place] [address]    Special Requests   Final    NONE Performed at [facility], [address] [place], [place]  [address]    Culture MULTIPLE SPECIES PRESENT, SUGGEST RECOLLECTION (A)  Final   Report Status 03/21/2023 FINAL  Final     Radiological Exams on Admission: DG Hip Unilat W or Wo Pelvis 2-3 Views Right  Result Date: 03/25/2023 CLINICAL DATA:  Fall EXAM: DG HIP (WITH OR WITHOUT PELVIS) 2-3V RIGHT COMPARISON:  None Available. FINDINGS: Subcapital right femoral neck fracture is present, nondisplaced. There is no dislocation. Bones are diffusely osteopenic. Joint spaces are maintained. Peripheral vascular calcifications are present. IMPRESSION: Nondisplaced subcapital right femoral neck fracture. Electronically Signed   By: Darliss Cheney M.D.   On: 03/25/2023 17:21     Assessment/Plan  1. Right hip fracture  - Based on the available data, Tyanah Tietjen presents an estimated 1.3% risk of perioperative MI or cardiac arrest; no preoperative cardiac testing is indicated for this patient  - Continue pain-control and supportive care, keep NPO after midnight, type & screen, repeat CBC in am, follow-up on orthopedic surgery recommendations    2. [name]; thrombocytopenia  - Recently stopped treatment, now on hospice at home  - Monitor CBCs, transfuse if needed    3. Cirrhosis   - Continue lactulose, hold diuretic while NPO   4. Type II DM  - Diet-controlled currently, A1c was only 5.6% in July 2024  - Check CBGs and use low-intensity SSI if needed    5. Hypertension  - Continue Toprol    6. UTI -  Currently on day #2 of cystitis treatment  - Continue ciprofloxacin    DVT prophylaxis: SCDs  Code Status: DNR  Level of Care: Level of care: Med-Surg Family Communication: Son at bedside   Disposition Plan:  Patient is from: home  Anticipated d/c is to: TBD Anticipated d/c date is: 03/29/23  Patient currently: Pending orthopedic surgery consultation and likely operative hip repair Consults called: Orthopedic surgery  Admission status: Inpatient     Briscoe Deutscher, MD [facility]  03/25/2023, 6:40 PM

## 2023-03-25 NOTE — ED Notes (Signed)
ED TO INPATIENT HANDOFF REPORT  ED Nurse Name and Phone #: Zachrey Deutscher, Paramedic  S Name/Age/Gender Kathleen Gibson 72 y.o. female Room/Bed: WA03/WA03  Code Status   Code Status: Limited: Do not attempt resuscitation (DNR) -DNR-LIMITED -Do Not Intubate/DNI   Home/SNF/Other   Patient oriented to: self, place, time, situation Is this baseline? Yes   Triage Complete: Triage complete  Chief Complaint Closed right hip fracture, initial encounter [facility]) [S72.001A]  Triage Note Patient presents from home due to a mechanical fall at home while trying to get to her bathroom.  Home. She landed on her right hip and has since experienced pain in which EMS administer 25 mcg of Fentanyl for. EMS noted shortening and external rotation. Patient uses a walker to ambulate and is not currently on blood thinners.    EMS vitals: 126/72 BP 100 HR 99% SPO2 on room air   Allergies Allergies  Allergen Reactions   Asa [Aspirin] Other (See Comments)    Contraindicated d/t low plts   Nsaids Other (See Comments)    Contraindicated d/t low plts   Red Blood Cells Nausea Only and Other (See Comments)    Near end of transfusion pt developed nausea, facial flushing, and tachycardia. See progress note from 03/01/22    Level of Care/Admitting Diagnosis ED Disposition     ED Disposition  Admit   Condition  --   Comment  Hospital Area: [facility] [[id number]]  Level of Care: Med-Surg [16]  May admit patient to [facility] or [facility] if equivalent level of care is available:: No  Covid Evaluation: Asymptomatic - no recent exposure (last 10 days) testing not required  Diagnosis: Closed right hip fracture, initial encounter [facility]) [[id number]]  Admitting Physician: Briscoe Deutscher [[id number]]  Attending Physician: Briscoe Deutscher [[id number]]  Certification:: I certify this patient will need inpatient services for at least 2 midnights  Expected Medical Readiness: 03/28/2023           B Medical/Surgery History Past Medical History:  Diagnosis Date   Acute bronchitis due to COVID-19 virus 05/16/2022   Diabetes (HCC)    HLD (hyperlipidemia)    Hypertension    Pernicious anemia    Pneumonia 09/05/2019   Renal insufficiency 09/06/2019   Skin rash 12/21/2022   Vitamin D deficiency    Past Surgical History:  Procedure Laterality Date   ABDOMINAL HERNIA REPAIR  2009   ABDOMINOPLASTY     AXILLARY LYMPH NODE BIOPSY Left 11/11/2020   Procedure: LEFT AXILLARY EXCISIONAL LYMPH NODE BIOPSY;  Surgeon: Darnell Level, MD;  Location: [facility] OR;  Service: General;  Laterality: Left;   CESAREAN SECTION  1978   GASTRIC BYPASS  2000   hemorroid surgery  2007   IR IMAGING GUIDED PORT INSERTION  07/26/2021   IR KYPHO EA ADDL LEVEL THORACIC OR LUMBAR  05/31/2021   IR KYPHO THORACIC WITH BONE BIOPSY  05/31/2021   IR PARACENTESIS  03/15/2023   IR PARACENTESIS  03/20/2023   TONSILLECTOMY AND ADENOIDECTOMY  1957     A IV Location/Drains/Wounds Patient Lines/Drains/Airways Status     Active Line/Drains/Airways     Name Placement date Placement time Site Days   Implanted Port 07/26/21 Right Chest 07/26/21  1550  Chest  607   Peripheral IV 03/25/23 22 G Right Antecubital 03/25/23  1548  Antecubital  less than 1            Intake/Output Last 24 hours No intake or output data in the  24 hours ending 03/25/23 1907  Labs/Imaging Results for orders placed or performed during the hospital encounter of 03/25/23 (from the past 48 hour(s))  CBC with Differential/Platelet     Status: Abnormal   Collection Time: 03/25/23  4:48 PM  Result Value Ref Range   WBC 4.0 4.0 - 10.5 K/uL   RBC 3.14 (L) 3.87 - 5.11 MIL/uL   Hemoglobin 9.6 (L) 12.0 - 15.0 g/dL   HCT 16.1 (L) 09.6 - 04.5 %   MCV 93.6 80.0 - 100.0 fL   MCH 30.6 26.0 - 34.0 pg   MCHC 32.7 30.0 - 36.0 g/dL   RDW 40.9 (H) 81.1 - 91.4 %   Platelets 99 (L) 150 - 400 K/uL    Comment: SPECIMEN CHECKED FOR CLOTS REPEATED TO VERIFY     nRBC 0.0 0.0 - 0.2 %   Neutrophils Relative % 77 %   Neutro Abs 3.1 1.7 - 7.7 K/uL   Lymphocytes Relative 8 %   Lymphs Abs 0.3 (L) 0.7 - 4.0 K/uL   Monocytes Relative 13 %   Monocytes Absolute 0.5 0.1 - 1.0 K/uL   Eosinophils Relative 1 %   Eosinophils Absolute 0.0 0.0 - 0.5 K/uL   Basophils Relative 1 %   Basophils Absolute 0.0 0.0 - 0.1 K/uL   WBC Morphology Mild Left Shift (1-5% metas, occ myelo)    Abs Immature Granulocytes 0.00 0.00 - 0.07 K/uL    Comment: Performed at [facility], [address] [place], [place] [address]  Basic metabolic panel     Status: Abnormal   Collection Time: 03/25/23  4:48 PM  Result Value Ref Range   Sodium 135 135 - 145 mmol/L   Potassium 3.8 3.5 - 5.1 mmol/L   Chloride 106 98 - 111 mmol/L   CO2 24 22 - 32 mmol/L   Glucose, Bld 123 (H) 70 - 99 mg/dL    Comment: Glucose reference range applies only to samples taken after fasting for at least 8 hours.   BUN 20 8 - 23 mg/dL   Creatinine, Ser 6.21 0.44 - 1.00 mg/dL   Calcium 7.3 (L) 8.9 - 10.3 mg/dL   GFR, Estimated >30 >86 mL/min    Comment: (NOTE) Calculated using the CKD-EPI Creatinine Equation (2021)    Anion gap 5 5 - 15    Comment: Performed at [facility], [address] [place], [place] [address]   *Note: Due to a large number of results and/or encounters for the requested time period, some results have not been displayed. A complete set of results can be found in Results Review.   DG Hip Unilat W or Wo Pelvis 2-3 Views Right  Result Date: 03/25/2023 CLINICAL DATA:  Fall EXAM: DG HIP (WITH OR WITHOUT PELVIS) 2-3V RIGHT COMPARISON:  None Available. FINDINGS: Subcapital right femoral neck fracture is present, nondisplaced. There is no dislocation. Bones are diffusely osteopenic. Joint spaces are maintained. Peripheral vascular calcifications are present. IMPRESSION: Nondisplaced subcapital right femoral neck fracture. Electronically Signed   By: Darliss Cheney M.D.   On: 03/25/2023 17:21    Pending Labs Unresulted Labs (From admission, onward)     Start     Ordered   03/26/23 0500  CBC  Daily,   R      03/25/23 1839   03/26/23 0500  Basic metabolic panel  Daily,   R      03/25/23 1839   03/26/23 0500  Type and screen [facility]  Once,   R       Comments: [facility]    03/25/23 1839            Vitals/Pain Today's Vitals   03/25/23 1550 03/25/23 1554  BP: 126/78   Pulse: (!) 106   Resp: 16   Temp: 98.3 F (36.8 C)   TempSrc: Oral   SpO2: 98%   PainSc:  0-No pain    Isolation Precautions No active isolations  Medications Medications  ciprofloxacin (CIPRO) tablet 250 mg (has no administration in time range)  metoprolol succinate (TOPROL-XL) 24 hr tablet 25 mg (has no administration in time range)  PARoxetine (PAXIL) tablet 10 mg (has no administration in time range)  pantoprazole (PROTONIX) EC tablet 40 mg (has no administration in time range)  insulin aspart (novoLOG) injection 0-6 Units (has no administration in time range)  oxyCODONE (Oxy IR/ROXICODONE) immediate release tablet 5-10 mg (has no administration in time range)  HYDROmorphone (DILAUDID) injection 0.5 mg (has no administration in time range)  methocarbamol (ROBAXIN) tablet 500 mg (has no administration in time range)    Or  methocarbamol (ROBAXIN) 500 mg in dextrose 5 % 50 mL IVPB (has no administration in time range)  senna-docusate (Senokot-S) tablet 1 tablet (has no administration in time range)  ondansetron (ZOFRAN) injection 4 mg (has no administration in time range)    Mobility non-ambulatory     Focused Assessments     R Recommendations: See Admitting Provider Note  Report given to:   Additional Notes:

## 2023-03-25 NOTE — Plan of Care (Signed)
Problem: Coping: Goal: Ability to adjust to condition or change in health will improve Outcome: Progressing   Problem: Pain Managment: Goal: General experience of comfort will improve Outcome: Progressing   Problem: Safety: Goal: Ability to remain free from injury will improve Outcome: Progressing

## 2023-03-26 ENCOUNTER — Inpatient Hospital Stay (HOSPITAL_COMMUNITY): Payer: Medicare HMO

## 2023-03-26 ENCOUNTER — Inpatient Hospital Stay (HOSPITAL_COMMUNITY): Payer: Medicare HMO | Admitting: Anesthesiology

## 2023-03-26 ENCOUNTER — Other Ambulatory Visit: Payer: Self-pay

## 2023-03-26 ENCOUNTER — Encounter (HOSPITAL_COMMUNITY)
Admission: EM | Disposition: A | Discharge status: Skilled Nursing Facility | Payer: Self-pay | Source: Home / Self Care | Attending: Family Medicine

## 2023-03-26 DIAGNOSIS — E1142 Type 2 diabetes mellitus with diabetic polyneuropathy: Secondary | ICD-10-CM

## 2023-03-26 DIAGNOSIS — D696 Thrombocytopenia, unspecified: Secondary | ICD-10-CM

## 2023-03-26 DIAGNOSIS — E1122 Type 2 diabetes mellitus with diabetic chronic kidney disease: Secondary | ICD-10-CM

## 2023-03-26 DIAGNOSIS — K7460 Unspecified cirrhosis of liver: Secondary | ICD-10-CM

## 2023-03-26 DIAGNOSIS — C9310 Chronic myelomonocytic leukemia not having achieved remission: Secondary | ICD-10-CM

## 2023-03-26 DIAGNOSIS — S72001A Fracture of unspecified part of neck of right femur, initial encounter for closed fracture: Secondary | ICD-10-CM

## 2023-03-26 DIAGNOSIS — R188 Other ascites: Secondary | ICD-10-CM

## 2023-03-26 DIAGNOSIS — I129 Hypertensive chronic kidney disease with stage 1 through stage 4 chronic kidney disease, or unspecified chronic kidney disease: Secondary | ICD-10-CM

## 2023-03-26 DIAGNOSIS — N189 Chronic kidney disease, unspecified: Secondary | ICD-10-CM | POA: Diagnosis not present

## 2023-03-26 DIAGNOSIS — Z96649 Presence of unspecified artificial hip joint: Secondary | ICD-10-CM

## 2023-03-26 HISTORY — PX: HIP ARTHROPLASTY: SHX981

## 2023-03-26 LAB — BASIC METABOLIC PANEL
Anion gap: 6 (ref 5–15)
BUN: 19 mg/dL (ref 8–23)
CO2: 25 mmol/L (ref 22–32)
Calcium: 7.5 mg/dL — ABNORMAL LOW (ref 8.9–10.3)
Chloride: 104 mmol/L (ref 98–111)
Creatinine, Ser: 1.07 mg/dL — ABNORMAL HIGH (ref 0.44–1.00)
GFR, Estimated: 56 mL/min — ABNORMAL LOW (ref >60)
Glucose, Bld: 122 mg/dL — ABNORMAL HIGH (ref 70–99)
Potassium: 3.8 mmol/L (ref 3.5–5.1)
Sodium: 135 mmol/L (ref 135–145)

## 2023-03-26 LAB — GLUCOSE, CAPILLARY
Glucose-Capillary: 105 mg/dL — ABNORMAL HIGH (ref 70–99)
Glucose-Capillary: 92 mg/dL (ref 70–99)
Glucose-Capillary: 93 mg/dL (ref 70–99)
Glucose-Capillary: 79 mg/dL (ref 70–99)
Glucose-Capillary: 210 mg/dL — ABNORMAL HIGH (ref 70–99)

## 2023-03-26 LAB — CBC
HCT: 28.7 % — ABNORMAL LOW (ref 36.0–46.0)
Hemoglobin: 9.3 g/dL — ABNORMAL LOW (ref 12.0–15.0)
MCH: 31 pg (ref 26.0–34.0)
MCHC: 32.4 g/dL (ref 30.0–36.0)
MCV: 95.7 fL (ref 80.0–100.0)
Platelets: 98 10*3/uL — ABNORMAL LOW (ref 150–400)
RBC: 3 MIL/uL — ABNORMAL LOW (ref 3.87–5.11)
RDW: 19.9 % — ABNORMAL HIGH (ref 11.5–15.5)
WBC: 4 10*3/uL (ref 4.0–10.5)
nRBC: 0 % (ref 0.0–0.2)

## 2023-03-26 LAB — SURGICAL PCR SCREEN
MRSA, PCR: NEGATIVE
Staphylococcus aureus: POSITIVE — AB

## 2023-03-26 LAB — PREPARE RBC (CROSSMATCH)

## 2023-03-26 SURGERY — HEMIARTHROPLASTY, HIP, DIRECT ANTERIOR APPROACH, FOR FRACTURE
Anesthesia: Spinal | Site: Hip | Laterality: Right

## 2023-03-26 MED ORDER — TRANEXAMIC ACID-NACL 1000-0.7 MG/100ML-% IV SOLN
INTRAVENOUS | Status: AC
  Filled 2023-03-26: qty 100

## 2023-03-26 MED ORDER — ONDANSETRON HCL 4 MG PO TABS: 4.0000 mg | ORAL_TABLET | Freq: Four times a day (QID) | ORAL | Status: DC | PRN

## 2023-03-26 MED ORDER — PHENYLEPHRINE HCL-NACL 20-0.9 MG/250ML-% IV SOLN
INTRAVENOUS | Status: DC | PRN
Start: 2023-03-26 — End: 2023-03-26
  Administered 2023-03-26: 50 ug/min via INTRAVENOUS

## 2023-03-26 MED ORDER — CHLORHEXIDINE GLUCONATE CLOTH 2 % EX PADS
6.0000 | MEDICATED_PAD | Freq: Every day | CUTANEOUS | Status: AC
  Administered 2023-03-26 – 2023-03-30 (×5): 6 via TOPICAL

## 2023-03-26 MED ORDER — TRANEXAMIC ACID-NACL 1000-0.7 MG/100ML-% IV SOLN
1000.0000 mg | INTRAVENOUS | Status: AC
  Administered 2023-03-26: 1000 mg via INTRAVENOUS

## 2023-03-26 MED ORDER — SODIUM CHLORIDE 0.9 % IV SOLN
INTRAVENOUS | Status: DC | PRN
Start: 2023-03-26 — End: 2023-03-26

## 2023-03-26 MED ORDER — PHENYLEPHRINE 80 MCG/ML (10ML) SYRINGE FOR IV PUSH (FOR BLOOD PRESSURE SUPPORT)
PREFILLED_SYRINGE | INTRAVENOUS | Status: DC | PRN
Start: 2023-03-26 — End: 2023-03-26
  Administered 2023-03-26 (×3): 80 ug via INTRAVENOUS

## 2023-03-26 MED ORDER — BISACODYL 10 MG RE SUPP: 10.0000 mg | Freq: Every day | RECTAL | Status: DC | PRN

## 2023-03-26 MED ORDER — STERILE WATER FOR IRRIGATION IR SOLN
Status: DC | PRN
Start: 2023-03-26 — End: 2023-03-26

## 2023-03-26 MED ORDER — METHOCARBAMOL 1000 MG/10ML IJ SOLN: 500.0000 mg | Freq: Four times a day (QID) | INTRAVENOUS | Status: DC | PRN

## 2023-03-26 MED ORDER — CAMPHOR-MENTHOL 0.5-0.5 % EX LOTN
TOPICAL_LOTION | CUTANEOUS | Status: DC | PRN
  Filled 2023-03-26: qty 222

## 2023-03-26 MED ORDER — OXYCODONE HCL 5 MG PO TABS
10.0000 mg | ORAL_TABLET | ORAL | Status: DC | PRN
  Administered 2023-03-26 – 2023-03-27 (×4): 10 mg via ORAL
  Filled 2023-03-26 (×4): qty 2

## 2023-03-26 MED ORDER — LACTATED RINGERS IV SOLN
INTRAVENOUS | Status: DC | PRN
Start: 2023-03-26 — End: 2023-03-26

## 2023-03-26 MED ORDER — PHENYLEPHRINE 80 MCG/ML (10ML) SYRINGE FOR IV PUSH (FOR BLOOD PRESSURE SUPPORT)
PREFILLED_SYRINGE | INTRAVENOUS | Status: AC
  Filled 2023-03-26: qty 10

## 2023-03-26 MED ORDER — SENNA 8.6 MG PO TABS
1.0000 | ORAL_TABLET | Freq: Every day | ORAL | Status: DC
  Administered 2023-03-26 – 2023-03-30 (×5): 8.6 mg via ORAL
  Filled 2023-03-26 (×5): qty 1

## 2023-03-26 MED ORDER — ALBUMIN HUMAN 5 % IV SOLN
INTRAVENOUS | Status: AC
  Filled 2023-03-26: qty 250

## 2023-03-26 MED ORDER — 0.9 % SODIUM CHLORIDE (POUR BTL) OPTIME
TOPICAL | Status: DC | PRN
Start: 2023-03-26 — End: 2023-03-26
  Administered 2023-03-26: 1000 mL

## 2023-03-26 MED ORDER — BUPIVACAINE HCL (PF) 0.5 % IJ SOLN
INTRAMUSCULAR | Status: DC | PRN
Start: 2023-03-26 — End: 2023-03-26
  Administered 2023-03-26: 12 mg via INTRATHECAL

## 2023-03-26 MED ORDER — LACTATED RINGERS IV SOLN: INTRAVENOUS | Status: AC

## 2023-03-26 MED ORDER — LACTULOSE 10 GM/15ML PO SOLN
10.0000 g | Freq: Two times a day (BID) | ORAL | Status: DC
  Administered 2023-03-26 – 2023-03-31 (×10): 10 g via ORAL
  Filled 2023-03-26 (×10): qty 15

## 2023-03-26 MED ORDER — CHLORHEXIDINE GLUCONATE 4 % EX SOLN: 60.0000 mL | Freq: Once | CUTANEOUS | Status: DC

## 2023-03-26 MED ORDER — BUPIVACAINE HCL (PF) 0.5 % IJ SOLN
INTRAMUSCULAR | Status: DC | PRN
Start: 2023-03-26 — End: 2023-03-26

## 2023-03-26 MED ORDER — CEFAZOLIN SODIUM-DEXTROSE 2-4 GM/100ML-% IV SOLN
2.0000 g | INTRAVENOUS | Status: AC
  Administered 2023-03-26: 2 g via INTRAVENOUS

## 2023-03-26 MED ORDER — DEXAMETHASONE SODIUM PHOSPHATE 10 MG/ML IJ SOLN
10.0000 mg | Freq: Once | INTRAMUSCULAR | Status: AC
  Administered 2023-03-27: 10 mg via INTRAVENOUS
  Filled 2023-03-26: qty 1

## 2023-03-26 MED ORDER — MUPIROCIN 2 % EX OINT
1.0000 | TOPICAL_OINTMENT | Freq: Two times a day (BID) | CUTANEOUS | Status: AC
  Administered 2023-03-26 – 2023-03-30 (×10): 1 via NASAL
  Filled 2023-03-26 (×3): qty 22

## 2023-03-26 MED ORDER — PROPOFOL 1000 MG/100ML IV EMUL
INTRAVENOUS | Status: AC
  Filled 2023-03-26: qty 100

## 2023-03-26 MED ORDER — METHOCARBAMOL 500 MG PO TABS
500.0000 mg | ORAL_TABLET | Freq: Four times a day (QID) | ORAL | Status: DC | PRN
  Administered 2023-03-26 – 2023-03-27 (×2): 500 mg via ORAL
  Filled 2023-03-26 (×2): qty 1

## 2023-03-26 MED ORDER — RIVAROXABAN 10 MG PO TABS
10.0000 mg | ORAL_TABLET | Freq: Every day | ORAL | Status: DC
  Administered 2023-03-27 – 2023-03-31 (×5): 10 mg via ORAL
  Filled 2023-03-26 (×5): qty 1

## 2023-03-26 MED ORDER — ONDANSETRON HCL 4 MG/2ML IJ SOLN
INTRAMUSCULAR | Status: AC
  Filled 2023-03-26: qty 2

## 2023-03-26 MED ORDER — CEFAZOLIN SODIUM-DEXTROSE 2-4 GM/100ML-% IV SOLN
INTRAVENOUS | Status: AC
  Filled 2023-03-26: qty 100

## 2023-03-26 MED ORDER — ALUM & MAG HYDROXIDE-SIMETH 200-200-20 MG/5ML PO SUSP: 30.0000 mL | ORAL | Status: DC | PRN

## 2023-03-26 MED ORDER — METOCLOPRAMIDE HCL 5 MG/ML IJ SOLN: 5.0000 mg | Freq: Three times a day (TID) | INTRAMUSCULAR | Status: DC | PRN

## 2023-03-26 MED ORDER — PROPOFOL 10 MG/ML IV BOLUS
INTRAVENOUS | Status: DC | PRN
Start: 2023-03-26 — End: 2023-03-26
  Administered 2023-03-26: 30 mg via INTRAVENOUS
  Administered 2023-03-26: 20 mg via INTRAVENOUS

## 2023-03-26 MED ORDER — HYDROMORPHONE HCL 1 MG/ML IJ SOLN: 0.5000 mg | INTRAMUSCULAR | Status: DC | PRN

## 2023-03-26 MED ORDER — ONDANSETRON HCL 4 MG/2ML IJ SOLN: 4.0000 mg | Freq: Four times a day (QID) | INTRAMUSCULAR | Status: DC | PRN

## 2023-03-26 MED ORDER — PHENOL 1.4 % MT LIQD: 1.0000 | OROMUCOSAL | Status: DC | PRN

## 2023-03-26 MED ORDER — CEFAZOLIN SODIUM-DEXTROSE 2-4 GM/100ML-% IV SOLN
2.0000 g | Freq: Four times a day (QID) | INTRAVENOUS | Status: AC
  Administered 2023-03-26 – 2023-03-27 (×2): 2 g via INTRAVENOUS
  Filled 2023-03-26 (×2): qty 100

## 2023-03-26 MED ORDER — PROPOFOL 500 MG/50ML IV EMUL
INTRAVENOUS | Status: DC | PRN
  Administered 2023-03-26: 50 ug/kg/min via INTRAVENOUS

## 2023-03-26 MED ORDER — MENTHOL 3 MG MT LOZG: 1.0000 | LOZENGE | OROMUCOSAL | Status: DC | PRN

## 2023-03-26 MED ORDER — POVIDONE-IODINE 10 % EX SWAB: 2.0000 | Freq: Once | CUTANEOUS | Status: DC

## 2023-03-26 MED ORDER — PREGABALIN 75 MG PO CAPS
150.0000 mg | ORAL_CAPSULE | Freq: Two times a day (BID) | ORAL | Status: DC
  Administered 2023-03-26 – 2023-03-28 (×4): 150 mg via ORAL
  Filled 2023-03-26 (×4): qty 2

## 2023-03-26 MED ORDER — DEXAMETHASONE SODIUM PHOSPHATE 10 MG/ML IJ SOLN
INTRAMUSCULAR | Status: AC
  Filled 2023-03-26: qty 1

## 2023-03-26 MED ORDER — DIPHENHYDRAMINE HCL 12.5 MG/5ML PO ELIX
12.5000 mg | ORAL_SOLUTION | ORAL | Status: DC | PRN
  Administered 2023-03-31: 25 mg via ORAL
  Filled 2023-03-26: qty 10

## 2023-03-26 MED ORDER — ACETAMINOPHEN 10 MG/ML IV SOLN: 1000.0000 mg | Freq: Once | INTRAVENOUS | Status: DC | PRN

## 2023-03-26 MED ORDER — METOCLOPRAMIDE HCL 5 MG PO TABS: 5.0000 mg | ORAL_TABLET | Freq: Three times a day (TID) | ORAL | Status: DC | PRN

## 2023-03-26 MED ORDER — LORAZEPAM 1 MG PO TABS
1.0000 mg | ORAL_TABLET | Freq: Three times a day (TID) | ORAL | Status: DC | PRN
  Administered 2023-03-30 – 2023-03-31 (×2): 1 mg via ORAL
  Filled 2023-03-26 (×2): qty 1

## 2023-03-26 MED ORDER — ONDANSETRON HCL 4 MG/2ML IJ SOLN: 4.0000 mg | Freq: Once | INTRAMUSCULAR | Status: DC | PRN

## 2023-03-26 MED ORDER — FENTANYL CITRATE (PF) 100 MCG/2ML IJ SOLN
INTRAMUSCULAR | Status: DC | PRN
  Administered 2023-03-26: 25 ug via INTRAVENOUS

## 2023-03-26 MED ORDER — ALLOPURINOL 300 MG PO TABS
300.0000 mg | ORAL_TABLET | Freq: Every day | ORAL | Status: DC
  Administered 2023-03-27 – 2023-03-28 (×2): 300 mg via ORAL
  Filled 2023-03-26 (×2): qty 1

## 2023-03-26 MED ORDER — SODIUM CHLORIDE 0.9% IV SOLUTION: Freq: Once | INTRAVENOUS | Status: DC

## 2023-03-26 MED ORDER — TRANEXAMIC ACID-NACL 1000-0.7 MG/100ML-% IV SOLN
1000.0000 mg | Freq: Once | INTRAVENOUS | Status: AC
  Administered 2023-03-26: 1000 mg via INTRAVENOUS
  Filled 2023-03-26: qty 100

## 2023-03-26 MED ORDER — FENTANYL CITRATE (PF) 100 MCG/2ML IJ SOLN
INTRAMUSCULAR | Status: AC
  Filled 2023-03-26: qty 2

## 2023-03-26 MED ORDER — INSULIN ASPART 100 UNIT/ML IJ SOLN
0.0000 [IU] | Freq: Three times a day (TID) | INTRAMUSCULAR | Status: DC
  Administered 2023-03-27 (×3): 3 [IU] via SUBCUTANEOUS
  Administered 2023-03-28: 4 [IU] via SUBCUTANEOUS
  Administered 2023-03-28: 1 [IU] via SUBCUTANEOUS
  Administered 2023-03-28: 3 [IU] via SUBCUTANEOUS
  Administered 2023-03-29 (×2): 2 [IU] via SUBCUTANEOUS
  Administered 2023-03-29: 3 [IU] via SUBCUTANEOUS
  Administered 2023-03-30 – 2023-03-31 (×3): 1 [IU] via SUBCUTANEOUS

## 2023-03-26 MED ORDER — FENTANYL CITRATE PF 50 MCG/ML IJ SOSY: 25.0000 ug | PREFILLED_SYRINGE | INTRAMUSCULAR | Status: DC | PRN

## 2023-03-26 MED ORDER — ONDANSETRON HCL 4 MG/2ML IJ SOLN
INTRAMUSCULAR | Status: DC | PRN
Start: 2023-03-26 — End: 2023-03-26
  Administered 2023-03-26: 4 mg via INTRAVENOUS

## 2023-03-26 MED ORDER — OXYCODONE HCL 5 MG PO TABS
2.5000 mg | ORAL_TABLET | ORAL | Status: DC | PRN
  Administered 2023-03-28 (×2): 2.5 mg via ORAL
  Administered 2023-03-30 – 2023-03-31 (×2): 5 mg via ORAL
  Filled 2023-03-26 (×2): qty 1

## 2023-03-26 SURGICAL SUPPLY — 47 items
ADH SKN CLS APL DERMABOND .7 (GAUZE/BANDAGES/DRESSINGS) ×1
BAG COUNTER SPONGE SURGICOUNT (BAG) IMPLANT
BAG SPEC THK2 15X12 ZIP CLS (MISCELLANEOUS) ×1
BAG SPNG CNTER NS LX DISP (BAG)
BAG ZIPLOCK 12X15 (MISCELLANEOUS) ×1 IMPLANT
BLADE SAW SGTL 18X1.27X75 (BLADE) ×1 IMPLANT
COVER SURGICAL LIGHT HANDLE (MISCELLANEOUS) ×1 IMPLANT
DERMABOND ADVANCED .7 DNX12 (GAUZE/BANDAGES/DRESSINGS) ×1 IMPLANT
DRAPE ORTHO SPLIT 77X108 STRL (DRAPES) ×2
DRAPE POUCH INSTRU U-SHP 10X18 (DRAPES) ×1 IMPLANT
DRAPE SURG 17X11 SM STRL (DRAPES) ×1 IMPLANT
DRAPE SURG ORHT 6 SPLT 77X108 (DRAPES) ×2 IMPLANT
DRAPE U-SHAPE 47X51 STRL (DRAPES) ×2 IMPLANT
DRESSING AQUACEL AG SP 3.5X10 (GAUZE/BANDAGES/DRESSINGS) IMPLANT
DRSG AQUACEL AG ADV 3.5X10 (GAUZE/BANDAGES/DRESSINGS) ×1 IMPLANT
DRSG AQUACEL AG SP 3.5X10 (GAUZE/BANDAGES/DRESSINGS) ×1
DRSG TEGADERM 4X4.75 (GAUZE/BANDAGES/DRESSINGS) ×1 IMPLANT
DURAPREP 26ML APPLICATOR (WOUND CARE) ×1 IMPLANT
ELECT BLADE TIP CTD 4 INCH (ELECTRODE) ×1 IMPLANT
ELECT REM PT RETURN 15FT ADLT (MISCELLANEOUS) ×1 IMPLANT
FACESHIELD WRAPAROUND (MASK) ×2 IMPLANT
FACESHIELD WRAPAROUND OR TEAM (MASK) ×2 IMPLANT
GLOVE BIO SURGEON STRL SZ 6 (GLOVE) ×1 IMPLANT
GLOVE BIOGEL PI IND STRL 6.5 (GLOVE) ×1 IMPLANT
GLOVE BIOGEL PI IND STRL 7.5 (GLOVE) ×1 IMPLANT
GLOVE ORTHO TXT STRL SZ7.5 (GLOVE) ×2 IMPLANT
GOWN STRL REUS W/ TWL LRG LVL3 (GOWN DISPOSABLE) ×2 IMPLANT
GOWN STRL REUS W/TWL LRG LVL3 (GOWN DISPOSABLE) ×2
HANDPIECE INTERPULSE COAX TIP (DISPOSABLE)
HEAD FEM UNIPOLAR 47 OD STRL (Hips) IMPLANT
KIT BASIN OR (CUSTOM PROCEDURE TRAY) ×1 IMPLANT
KIT TURNOVER KIT A (KITS) IMPLANT
MANIFOLD NEPTUNE II (INSTRUMENTS) ×1 IMPLANT
PACK TOTAL JOINT (CUSTOM PROCEDURE TRAY) ×1 IMPLANT
PROTECTOR NERVE ULNAR (MISCELLANEOUS) ×1 IMPLANT
SET HNDPC FAN SPRY TIP SCT (DISPOSABLE) IMPLANT
SPACER FEM TAPERED +0 12/14 (Hips) IMPLANT
STEM FEM ACTIS STD SZ7 (Nail) IMPLANT
SUT MNCRL AB 4-0 PS2 18 (SUTURE) ×1 IMPLANT
SUT STRATAFIX 0 PDS 27 VIOLET (SUTURE) ×2
SUT VIC AB 1 CT1 36 (SUTURE) ×1 IMPLANT
SUT VIC AB 2-0 CT1 27 (SUTURE) ×2
SUT VIC AB 2-0 CT1 TAPERPNT 27 (SUTURE) ×2 IMPLANT
SUTURE STRATFX 0 PDS 27 VIOLET (SUTURE) ×1 IMPLANT
TOWEL OR 17X26 10 PK STRL BLUE (TOWEL DISPOSABLE) ×2 IMPLANT
TRAY FOLEY MTR SLVR 16FR STAT (SET/KITS/TRAYS/PACK) ×1 IMPLANT
TUBE SUCTION HIGH CAP CLEAR NV (SUCTIONS) IMPLANT

## 2023-03-26 NOTE — Progress Notes (Signed)
Subjective:    Patient reports pain as mild.   Patient seen in rounds for Dr. Charlann Boxer. Patient is resting in bed on exam this morning. She tells me that she normally lives between her sister and her daughter's homes in [place]. She recently d/c chemotherapy for [name] and started hospice. Her daughter is traveling home from [facility] today.    Objective: Vital signs in last 24 hours: Temp:  [98.1 F (36.7 C)-99.1 F (37.3 C)] 98.1 F (36.7 C) (10/13 0610) Pulse Rate:  [102-108] 102 (10/13 0844) Resp:  [16-17] 16 (10/13 0610) BP: (117-138)/(71-79) 128/79 (10/13 0844) SpO2:  [97 %-99 %] 97 % (10/13 0610) Weight:  [70.4 kg] 70.4 kg (10/12 2019)  Intake/Output from previous day:  Intake/Output Summary (Last 24 hours) at 03/26/2023 0950 Last data filed at 03/26/2023 0600 Gross per 24 hour  Intake 520 ml  Output 550 ml  Net -30 ml     Intake/Output this shift: No intake/output data recorded.  Labs: Recent Labs    03/25/23 1648 03/26/23 0353  HGB 9.6* 9.3*   Recent Labs    03/25/23 1648 03/26/23 0353  WBC 4.0 4.0  RBC 3.14* 3.00*  HCT 29.4* 28.7*  PLT 99* 98*   Recent Labs    03/25/23 1648 03/26/23 0353  NA 135 135  K 3.8 3.8  CL 106 104  CO2 24 25  BUN 20 19  CREATININE 0.89 1.07*  GLUCOSE 123* 122*  CALCIUM 7.3* 7.5*   No results for input(s): "LABPT", "INR" in the last 72 hours.  Exam: General - Patient is Alert and Oriented Extremity -  RLE: Shortened and externally rotated ROM deferred Sensation and pulses intact distally Moving ankle and toes    Past Medical History:  Diagnosis Date   Acute bronchitis due to COVID-19 virus 05/16/2022   Diabetes (HCC)    HLD (hyperlipidemia)    Hypertension    Pernicious anemia    Pneumonia 09/05/2019   Renal insufficiency 09/06/2019   Skin rash 12/21/2022   Vitamin D deficiency     Assessment/Plan:    Principal Problem:   Closed right hip fracture, initial encounter (HCC) Active Problems:    Type 2 diabetes mellitus with diabetic polyneuropathy, without long-term current use of insulin (HCC)   Thrombocytopenia (HCC)   [name] (chronic myelomonocytic leukemia) (HCC)   Liver cirrhosis (HCC)   Acute cystitis  Estimated body mass index is 28.4 kg/m as calculated from the following:   Height as of this encounter: 5\' 2"  (1.575 m).   Weight as of this encounter: 70.4 kg.   Assessment: #1 Right Femoral neck fracture #2 [name] - on hospice #3 Cirrhosis   Plan:   I discussed with the patient her diagnosis and treatment options. We discussed that non-operative management would mean she would be NWB and bed bound, which she does not want. Surgically, we would plan for hemiarthroplasty. She does wish to proceed with this. Per medical team, she is at low risk of perioperative MI or cardiac arrest (1.3%). Unfortunately due to her platelet count, she will likely have to be done under general anesthesia which we discussed. She is chronically anemic, and will likely need a post-operative blood transfusion which she understands. We will plan on d/c home with family after surgery as opposed to SNF as she is immunocompromised and has adequate help at home.   I called and spoke with her daughter, Raynelle Fanning, who is in agreement with this plan. She will be traveling home from  [facility] today.   Tentative plan for surgery at 3 PM today. NPO. Hold any anticoagulants.    Rosalene Billings, PA-C Orthopedic Surgery [phone] 03/26/2023, 9:50 AM

## 2023-03-26 NOTE — Hospital Course (Addendum)
72 year old woman PMH including chronic myelomonocytic leukemia, recently discontinued treatment and entered hospice at home, diabetes mellitus type 2, presented with right hip pain after fall.  Admitted for hip fracture.  Consultants Orthopedics  Procedures None

## 2023-03-26 NOTE — Transfer of Care (Signed)
Immediate Anesthesia Transfer of Care Note  Patient: Kathleen Gibson  Procedure(s) Performed: ARTHROPLASTY BIPOLAR HIP (HEMIARTHROPLASTY) (Right: Hip)  Patient Location: PACU  Anesthesia Type:Spinal  Level of Consciousness: awake, alert , oriented, and patient cooperative  Airway & Oxygen Therapy: Patient Spontanous Breathing and Patient connected to face mask oxygen  Post-op Assessment: Report given to RN and Post -op Vital signs reviewed and stable  Post vital signs: Reviewed and stable  Last Vitals:  Vitals Value Taken Time  BP 105/62 03/26/23 1715  Temp    Pulse 76 03/26/23 1715  Resp 10 03/26/23 1718  SpO2 100 % 03/26/23 1715  Vitals shown include unfiled device data.  Last Pain:  Vitals:   03/26/23 1500  TempSrc:   PainSc: 0-No pain         Complications: No notable events documented.

## 2023-03-26 NOTE — Anesthesia Procedure Notes (Signed)
Procedure Name: MAC Date/Time: 03/26/2023 3:28 PM  Performed by: Maurene Capes, CRNAPre-anesthesia Checklist: Patient identified, Emergency Drugs available, Suction available, Patient being monitored and Timeout performed Patient Re-evaluated:Patient Re-evaluated prior to induction Oxygen Delivery Method: Simple face mask Preoxygenation: Pre-oxygenation with 100% oxygen Induction Type: IV induction Placement Confirmation: positive ETCO2 Dental Injury: Teeth and Oropharynx as per pre-operative assessment

## 2023-03-26 NOTE — H&P (View-Only) (Signed)
Patient ID: Kathleen Gibson, female   DOB: [birth year], 72 y.o.   MRN: [id number]  Kathleen Gibson was admitted after a fall reporting right hip pain.  Radiographs revealed a right femoral neck fracture.  I agree with the exam and assessment from Dr. Shelle Iron and Rosalene Billings, PA-C.  Our recommendations would be for a right hip hemiarthroplasty to manage her femoral neck fracture to help with pain and allow for her to remain as mobile as possible. Consent will be obtained for management of her hip fracture. As noted, findings and plan reviewed with her daughter. Postoperatively she will be weightbearing as tolerated. We will work to get her back to her home environment as soon as possible based on physical therapy assessment and needs.

## 2023-03-26 NOTE — Interval H&P Note (Signed)
History and Physical Interval Note:  03/26/2023 3:18 PM  Kathleen Gibson  has presented today for surgery, with the diagnosis of RIGHT FEMORAL NECK FRACTURE.  The various methods of treatment have been discussed with the patient and family. After consideration of risks, benefits and other options for treatment, the patient has consented to  Procedure(s): ARTHROPLASTY BIPOLAR HIP (HEMIARTHROPLASTY) (Right) as a surgical intervention.  The patient's history has been reviewed, patient examined, no change in status, stable for surgery.  I have reviewed the patient's chart and labs.  Questions were answered to the patient's satisfaction.     Shelda Pal

## 2023-03-26 NOTE — Progress Notes (Signed)
Patient ID: Kathleen Gibson, female   DOB: [birth year], 72 y.o.   MRN: [id number]  Kathleen Gibson was admitted after a fall reporting right hip pain.  Radiographs revealed a right femoral neck fracture.  I agree with the exam and assessment from Dr. Shelle Iron and Rosalene Billings, PA-C.  Our recommendations would be for a right hip hemiarthroplasty to manage her femoral neck fracture to help with pain and allow for her to remain as mobile as possible. Consent will be obtained for management of her hip fracture. As noted, findings and plan reviewed with her daughter. Postoperatively she will be weightbearing as tolerated. We will work to get her back to her home environment as soon as possible based on physical therapy assessment and needs.

## 2023-03-26 NOTE — Anesthesia Procedure Notes (Addendum)
Spinal  Patient location during procedure: OR Start time: 03/26/2023 3:35 PM End time: 03/26/2023 3:41 PM Reason for block: surgical anesthesia Staffing Performed: anesthesiologist  Anesthesiologist: Trevor Iha, MD Performed by: Trevor Iha, MD Authorized by: Trevor Iha, MD   Preanesthetic Checklist Completed: patient identified, IV checked, risks and benefits discussed, surgical consent, monitors and equipment checked, pre-op evaluation and timeout performed Spinal Block Patient position: left lateral decubitus Prep: DuraPrep and site prepped and draped Patient monitoring: heart rate, cardiac monitor, continuous pulse ox and blood pressure Approach: midline Location: L3-4 Injection technique: single-shot Needle Needle type: Pencan  Needle gauge: 24 G Needle length: 10 cm Needle insertion depth: 5 cm Assessment Sensory level: T4 Events: CSF return Additional Notes  1 Attempt (s). Pt tolerated procedure well.

## 2023-03-26 NOTE — Anesthesia Preprocedure Evaluation (Addendum)
Anesthesia Evaluation  Patient identified by MRN, date of birth, ID band Patient awake    Reviewed: Allergy & Precautions, NPO status , Patient's Chart, lab work & pertinent test results  Airway Mallampati: I  TM Distance: >3 FB Neck ROM: Full    Dental no notable dental hx. (+) Missing, Poor Dentition, Dental Advisory Given   Pulmonary    Pulmonary exam normal breath sounds clear to auscultation       Cardiovascular hypertension, Pt. on medications Normal cardiovascular exam Rhythm:Regular Rate:Normal     Neuro/Psych TIA   GI/Hepatic negative GI ROS,,,(+) Cirrhosis         Endo/Other  diabetes, Type 2    Renal/GU Renal diseaseLab Results      Component                Value               Date                              K                        3.8                 03/26/2023                   CREATININE               1.07 (H)            03/26/2023               PHOS                     3.6                 03/08/2021                ALBUMIN                  2.7 (L)             03/13/2023                GLUCOSE                  122 (H)             03/26/2023                Musculoskeletal negative musculoskeletal ROS (+)    Abdominal   Peds negative pediatric ROS (+)  Hematology  (+) Blood dyscrasia, anemia CML Lab Results      Component                Value               Date                      WBC                      4.0                 03/26/2023                HGB  9.3 (L)             03/26/2023                HCT                      28.7 (L)            03/26/2023                MCV                      95.7                03/26/2023                PLT                      98 (L)              03/26/2023              Anesthesia Other Findings ALL:  asa, NSAIDs  Reproductive/Obstetrics                             Anesthesia Physical Anesthesia Plan  ASA:  3  Anesthesia Plan: Spinal   Post-op Pain Management:    Induction: Intravenous  PONV Risk Score and Plan: Treatment may vary due to age or medical condition, Ondansetron and Propofol infusion  Airway Management Planned: Natural Airway  Additional Equipment: None  Intra-op Plan:   Post-operative Plan:   Informed Consent:      Dental advisory given  Plan Discussed with:   Anesthesia Plan Comments: (Spinal)        Anesthesia Quick Evaluation

## 2023-03-26 NOTE — Progress Notes (Signed)
Progress Note   Patient: Kathleen Gibson HKV:[id number] DOB: 12-21-1950 DOA: 03/25/2023     1 DOS: the patient was seen and examined on 03/26/2023   Brief hospital course: 72 year old woman PMH including chronic myelomonocytic leukemia, recently discontinued treatment and entered hospice at home, diabetes mellitus type 2, presented with right hip pain after fall.  Admitted for hip fracture.  Consultants Orthopedics  Procedures None  Assessment and Plan: Right hip fracture status post mechanical fall Nondisplaced right superior pubic ramus fracture Medically stable.  Proceed with surgery. Management per orthopedics.   [name] Thrombocytopenia  Normocytic anemia  Recently stopped treatment.  Has hospice at home. Hgb stable 9.3 Plts 98 stable   Cirrhosis   Appears stable.  Continue lactulose, hold diuretic while NPO    Type II DM  A1c was only 5.6% in July 2024.  Hold Januvia. CBG stable   Essential hypertension  Continue Toprol     UTI Treat total 3 days.  Prolonged QTc -- resolved Repeat EKG 10/13 shows resolution     Subjective:  Has hip pain Did not lose consciousness   Physical Exam: Vitals:   03/26/23 0610 03/26/23 0844 03/26/23 0957 03/26/23 1157  BP: 127/78 128/79 123/81 113/75  Pulse: (!) 108 (!) 102 (!) 102 96  Resp: 16  16 16   Temp: 98.1 F (36.7 C)  98 F (36.7 C) 98 F (36.7 C)  TempSrc: Oral     SpO2: 97%  96% 97%  Weight:      Height:       Physical Exam Vitals reviewed.  Constitutional:      General: She is not in acute distress.    Appearance: She is not ill-appearing or toxic-appearing.  Cardiovascular:     Rate and Rhythm: Normal rate and regular rhythm.     Heart sounds: No murmur heard. Pulmonary:     Effort: Pulmonary effort is normal. No respiratory distress.     Breath sounds: No wheezing, rhonchi or rales.  Neurological:     Mental Status: She is alert.  Psychiatric:        Mood and Affect: Mood normal.        Behavior:  Behavior normal.     Data Reviewed: CBG stable BMP noted Hgb stable 9.3 Plts 98 stable  Family Communication: niece at bedside  Disposition: Status is: Inpatient Remains inpatient appropriate because: hip fracture     Time spent: 30 minutes  Author: Brendia Sacks, MD 03/26/2023 2:13 PM  For on call review [email or web address].

## 2023-03-26 NOTE — Discharge Instructions (Addendum)
INSTRUCTIONS AFTER JOINT REPLACEMENT   Remove items at home which could result in a fall. This includes throw rugs or furniture in walking pathways ICE to the affected joint every three hours while awake for 30 minutes at a time, for at least the first 3-5 days, and then as needed for pain and swelling.  Continue to use ice for pain and swelling. You may notice swelling that will progress down to the foot and ankle.  This is normal after surgery.  Elevate your leg when you are not up walking on it.   Continue to use the breathing machine you got in the hospital (incentive spirometer) which will help keep your temperature down.  It is common for your temperature to cycle up and down following surgery, especially at night when you are not up moving around and exerting yourself.  The breathing machine keeps your lungs expanded and your temperature down.   DIET:  As you were doing prior to hospitalization, we recommend a well-balanced diet.  DRESSING / WOUND CARE / SHOWERING  Keep the surgical dressing until follow up.  The dressing is water proof, so you can shower without any extra covering.  IF THE DRESSING FALLS OFF or the wound gets wet inside, change the dressing with sterile gauze.  Please use good hand washing techniques before changing the dressing.  Do not use any lotions or creams on the incision until instructed by your surgeon.    ACTIVITY  Increase activity slowly as tolerated, but follow the weight bearing instructions below.   No driving for 6 weeks or until further direction given by your physician.  You cannot drive while taking narcotics.  No lifting or carrying greater than 10 lbs. until further directed by your surgeon. Avoid periods of inactivity such as sitting longer than an hour when not asleep. This helps prevent blood clots.  You may return to work once you are authorized by your doctor.     WEIGHT BEARING   Weight bearing as tolerated with assist device (walker, cane,  etc) as directed, use it as long as suggested by your surgeon or therapist, typically at least 4-6 weeks.   EXERCISES  Results after joint replacement surgery are often greatly improved when you follow the exercise, range of motion and muscle strengthening exercises prescribed by your doctor. Safety measures are also important to protect the joint from further injury. Any time any of these exercises cause you to have increased pain or swelling, decrease what you are doing until you are comfortable again and then slowly increase them. If you have problems or questions, call your caregiver or physical therapist for advice.   Rehabilitation is important following a joint replacement. After just a few days of immobilization, the muscles of the leg can become weakened and shrink (atrophy).  These exercises are designed to build up the tone and strength of the thigh and leg muscles and to improve motion. Often times heat used for twenty to thirty minutes before working out will loosen up your tissues and help with improving the range of motion but do not use heat for the first two weeks following surgery (sometimes heat can increase post-operative swelling).   These exercises can be done on a training (exercise) mat, on the floor, on a table or on a bed. Use whatever works the best and is most comfortable for you.    Use music or television while you are exercising so that the exercises are a pleasant break in your  day. This will make your life better with the exercises acting as a break in your routine that you can look forward to.   Perform all exercises about fifteen times, three times per day or as directed.  You should exercise both the operative leg and the other leg as well.  Exercises include:   Quad Sets - Tighten up the muscle on the front of the thigh (Quad) and hold for 5-10 seconds.   Straight Leg Raises - With your knee straight (if you were given a brace, keep it on), lift the leg to 60  degrees, hold for 3 seconds, and slowly lower the leg.  Perform this exercise against resistance later as your leg gets stronger.  Leg Slides: Lying on your back, slowly slide your foot toward your buttocks, bending your knee up off the floor (only go as far as is comfortable). Then slowly slide your foot back down until your leg is flat on the floor again.  Angel Wings: Lying on your back spread your legs to the side as far apart as you can without causing discomfort.  Hamstring Strength:  Lying on your back, push your heel against the floor with your leg straight by tightening up the muscles of your buttocks.  Repeat, but this time bend your knee to a comfortable angle, and push your heel against the floor.  You may put a pillow under the heel to make it more comfortable if necessary.   A rehabilitation program following joint replacement surgery can speed recovery and prevent re-injury in the future due to weakened muscles. Contact your doctor or a physical therapist for more information on knee rehabilitation.    CONSTIPATION  Constipation is defined medically as fewer than three stools per week and severe constipation as less than one stool per week.  Even if you have a regular bowel pattern at home, your normal regimen is likely to be disrupted due to multiple reasons following surgery.  Combination of anesthesia, postoperative narcotics, change in appetite and fluid intake all can affect your bowels.   YOU MUST use at least one of the following options; they are listed in order of increasing strength to get the job done.  They are all available over the counter, and you may need to use some, POSSIBLY even all of these options:    Drink plenty of fluids (prune juice may be helpful) and high fiber foods Colace 100 mg by mouth twice a day  Senokot for constipation as directed and as needed Dulcolax (bisacodyl), take with full glass of water  Miralax (polyethylene glycol) once or twice a day as  needed.  If you have tried all these things and are unable to have a bowel movement in the first 3-4 days after surgery call either your surgeon or your primary doctor.    If you experience loose stools or diarrhea, hold the medications until you stool forms back up.  If your symptoms do not get better within 1 week or if they get worse, check with your doctor.  If you experience "the worst abdominal pain ever" or develop nausea or vomiting, please contact the office immediately for further recommendations for treatment.   ITCHING:  If you experience itching with your medications, try taking only a single pain pill, or even half a pain pill at a time.  You can also use Benadryl over the counter for itching or also to help with sleep.   TED HOSE STOCKINGS:  Use stockings on both  legs until for at least 2 weeks or as directed by physician office. They may be removed at night for sleeping.  MEDICATIONS:  See your medication summary on the "After Visit Summary" that nursing will review with you.  You may have some home medications which will be placed on hold until you complete the course of blood thinner medication.  It is important for you to complete the blood thinner medication as prescribed.  PRECAUTIONS:  If you experience chest pain or shortness of breath - call 911 immediately for transfer to the hospital emergency department.   If you develop a fever greater that 101 F, purulent drainage from wound, increased redness or drainage from wound, foul odor from the wound/dressing, or calf pain - CONTACT YOUR SURGEON.                                                   FOLLOW-UP APPOINTMENTS:  If you do not already have a post-op appointment, please call the office for an appointment to be seen by your surgeon.  Guidelines for how soon to be seen are listed in your "After Visit Summary", but are typically between 1-4 weeks after surgery.  OTHER INSTRUCTIONS:   Knee Replacement:  Do not place pillow  under knee, focus on keeping the knee straight while resting. CPM instructions: 0-90 degrees, 2 hours in the morning, 2 hours in the afternoon, and 2 hours in the evening. Place foam block, curve side up under heel at all times except when in CPM or when walking.  DO NOT modify, tear, cut, or change the foam block in any way.  POST-OPERATIVE OPIOID TAPER INSTRUCTIONS: It is important to wean off of your opioid medication as soon as possible. If you do not need pain medication after your surgery it is ok to stop day one. Opioids include: Codeine, Hydrocodone(Norco, Vicodin), Oxycodone(Percocet, oxycontin) and hydromorphone amongst others.  Long term and even short term use of opiods can cause: Increased pain response Dependence Constipation Depression Respiratory depression And more.  Withdrawal symptoms can include Flu like symptoms Nausea, vomiting And more Techniques to manage these symptoms Hydrate well Eat regular healthy meals Stay active Use relaxation techniques(deep breathing, meditating, yoga) Do Not substitute Alcohol to help with tapering If you have been on opioids for less than two weeks and do not have pain than it is ok to stop all together.  Plan to wean off of opioids This plan should start within one week post op of your joint replacement. Maintain the same interval or time between taking each dose and first decrease the dose.  Cut the total daily intake of opioids by one tablet each day Next start to increase the time between doses. The last dose that should be eliminated is the evening dose.   MAKE SURE YOU:  Understand these instructions.  Get help right away if you are not doing well or get worse.    Thank you for letting us be a part of your medical care team.  It is a privilege we respect greatly.  We hope these instructions will help you stay on track for a fast and full recovery!   Information on my medicine - XARELTO (Rivaroxaban)     Why was  Xarelto prescribed for you? Xarelto was prescribed for you to reduce the risk of blood clots forming  after orthopedic surgery. The medical term for these abnormal blood clots is venous thromboembolism (VTE).  What do you need to know about xarelto ? Take your Xarelto ONCE DAILY at the same time every day. You may take it either with or without food.  If you have difficulty swallowing the tablet whole, you may crush it and mix in applesauce just prior to taking your dose.  Take Xarelto exactly as prescribed by your doctor and DO NOT stop taking Xarelto without talking to the doctor who prescribed the medication.  Stopping without other VTE prevention medication to take the place of Xarelto may increase your risk of developing a clot.  After discharge, you should have regular check-up appointments with your healthcare provider that is prescribing your Xarelto.    What do you do if you miss a dose? If you miss a dose, take it as soon as you remember on the same day then continue your regularly scheduled once daily regimen the next day. Do not take two doses of Xarelto on the same day.   Important Safety Information A possible side effect of Xarelto is bleeding. You should call your healthcare provider right away if you experience any of the following: Bleeding from an injury or your nose that does not stop. Unusual colored urine (red or dark brown) or unusual colored stools (red or black). Unusual bruising for unknown reasons. A serious fall or if you hit your head (even if there is no bleeding).  Some medicines may interact with Xarelto and might increase your risk of bleeding while on Xarelto. To help avoid this, consult your healthcare provider or pharmacist prior to using any new prescription or non-prescription medications, including herbals, vitamins, non-steroidal anti-inflammatory drugs (NSAIDs) and supplements.  This website has more information on Xarelto:  [email or web address].

## 2023-03-26 NOTE — Anesthesia Postprocedure Evaluation (Signed)
Anesthesia Post Note  Patient: Kathleen Gibson  Procedure(s) Performed: ARTHROPLASTY BIPOLAR HIP (HEMIARTHROPLASTY) (Right: Hip)     Patient location during evaluation: Nursing Unit Anesthesia Type: Spinal Level of consciousness: oriented and awake and alert Pain management: pain level controlled Vital Signs Assessment: post-procedure vital signs reviewed and stable Respiratory status: spontaneous breathing and respiratory function stable Cardiovascular status: blood pressure returned to baseline and stable Postop Assessment: no headache, no backache, no apparent nausea or vomiting and patient able to bend at knees Anesthetic complications: no   No notable events documented.  Last Vitals:  Vitals:   03/26/23 1800 03/26/23 1815  BP: 106/74 101/65  Pulse: 83   Resp: 10 10  Temp:  36.8 C  SpO2: 99% 100%    Last Pain:  Vitals:   03/26/23 1815  TempSrc:   PainSc: 0-No pain                 Trevor Iha

## 2023-03-26 NOTE — Op Note (Signed)
NAME:  Kathleen Gibson                ACCOUNT NO.:  [id number]   MEDICAL RECORD NO.: [id number]   LOCATION:  1435                         FACILITY:  [facility]   DATE OF BIRTH:  [birth year]  PHYSICIAN:  Madlyn Frankel. Charlann Boxer, M.D.     DATE OF PROCEDURE:  03/26/2023                               OPERATIVE REPORT     PREOPERATIVE DIAGNOSIS:  right displaced femoral neck fracture.   POSTOPERATIVE DIAGNOSIS:  right displaced femoral neck fracture.   PROCEDURE:  right hip hemiarthroplasty utilizing DePuy component, size 7 standard Actis stem with a 47 unipolar ball with a +0 adapter.   SURGEON:  Madlyn Frankel. Charlann Boxer, MD   ASSISTANT:  Rosalene Billings, PA-C.   ANESTHESIA:  spinal   SPECIMENS:  None.   DRAINS:  none   BLOOD LOSS:  About 200 cc.   COMPLICATIONS:  None.   INDICATION OF PROCEDURE:  Ms Waag is a pleasant 72 year old female with multiple complex medical issues namely cirrhosis and CML recently opting for hospice care.  She lives with family independently.  She unfortunately had a fall at her house.  She was admitted to the hospital after radiographs revealed a right femoral neck fracture.  She was seen and evaluated and was scheduled for surgery for fixation.  The necessity of surgical repair was discussed with she and her family.  Consent was obtained after reviewing risks of infection, DVT, component failure, and need for revision surgery.   PROCEDURE IN DETAIL:  The patient was brought to the operative theater. Once adequate anesthesia, preoperative antibiotics, 2 g of Ancef administered, the patient was positioned into the left lateral decubitus position with the right side up.  The right lower extremity was then prepped and draped in sterile fashion.  A time-out was performed identifying the patient, planned procedure, and extremity.   A lateral incision was made off the proximal trochanter. Sharp dissection was carried down to the iliotibial band and gluteal fascia. The  gluteal fascia was then incised for posterior approach.  The short external rotators were taken down separate from the posterior capsule. An L capsulotomy was made preserving the posterior leaflet for later anatomic repair. Fracture site was identified and after removing comminuted segments of the posterior femoral neck, the femoral head was removed without difficulty and measured on the back table  using the sizing rings and determined to be 47 mm in diameter.   The proximal femur was then exposed.  Retractors placed.  I then drilled, opened the proximal femur.  Then I hand reamed once and  Irrigated the canal to try to prevent fat emboli.  I began broaching the femur with a starter broach up to a size 7 broach with good medial and lateral metaphyseal fit without evidence of any torsion or movement.  A trial reduction was carried out with a standard neck and a +0 adapter with a 47mm ball.  The hip reduced nicely.  The leg lengths appeared to be equal compared to the down leg.   The hip went through a range of motion without evidence of any subluxation or impingement.   Given these findings, the trial components removed.  The  final 7 standard  Actis stem was opened.  After irrigating the canal, the final stem was impacted and sat at the level where the broach was. Based on this and the trial reduction, a +0 adapter was opened and impacted in the 47 mm unipolar ball onto a clean and dry trunnion.  The hip had been irrigated throughout the case and again at this point.  I re- Approximated the posterior capsule to the superior leaflet using a  #1 Vicryl.  The remainder of the wound was closed with #1 Vicryl in the iliotibial band and gluteal fascia, a  2-0 Vicryl in the sub-Q tissue and a running 4-0 Monocryl in the skin.  The hip was cleaned, dried, and dressed sterilely using Dermabond and Aquacel dressing.  She was then brought to recovery room, extubated in stable condition, tolerating  the procedure well.  Rosalene Billings, PA-C was present and utilized as Geophysicist/field seismologist for the entire case from  Preoperative positioning to management of the contralateral extremity and retractors to  General facilitation of the procedure.  He was also involved with primary wound closure.         Madlyn Frankel Charlann Boxer, M.D.

## 2023-03-27 ENCOUNTER — Encounter (HOSPITAL_COMMUNITY): Payer: Self-pay | Admitting: Orthopedic Surgery

## 2023-03-27 ENCOUNTER — Other Ambulatory Visit: Payer: Medicare HMO | Admitting: Licensed Clinical Social Worker

## 2023-03-27 DIAGNOSIS — S72001A Fracture of unspecified part of neck of right femur, initial encounter for closed fracture: Secondary | ICD-10-CM | POA: Diagnosis not present

## 2023-03-27 DIAGNOSIS — E1142 Type 2 diabetes mellitus with diabetic polyneuropathy: Secondary | ICD-10-CM | POA: Diagnosis not present

## 2023-03-27 DIAGNOSIS — D649 Anemia, unspecified: Secondary | ICD-10-CM | POA: Insufficient documentation

## 2023-03-27 DIAGNOSIS — C9310 Chronic myelomonocytic leukemia not having achieved remission: Secondary | ICD-10-CM | POA: Diagnosis not present

## 2023-03-27 DIAGNOSIS — D696 Thrombocytopenia, unspecified: Secondary | ICD-10-CM | POA: Diagnosis not present

## 2023-03-27 LAB — BASIC METABOLIC PANEL
Anion gap: 8 (ref 5–15)
Anion gap: 8 (ref 5–15)
BUN: 32 mg/dL — ABNORMAL HIGH (ref 8–23)
BUN: 39 mg/dL — ABNORMAL HIGH (ref 8–23)
CO2: 21 mmol/L — ABNORMAL LOW (ref 22–32)
CO2: 22 mmol/L (ref 22–32)
Calcium: 7 mg/dL — ABNORMAL LOW (ref 8.9–10.3)
Calcium: 6.9 mg/dL — ABNORMAL LOW (ref 8.9–10.3)
Chloride: 103 mmol/L (ref 98–111)
Chloride: 101 mmol/L (ref 98–111)
Creatinine, Ser: 1.24 mg/dL — ABNORMAL HIGH (ref 0.44–1.00)
Creatinine, Ser: 1.64 mg/dL — ABNORMAL HIGH (ref 0.44–1.00)
GFR, Estimated: 47 mL/min — ABNORMAL LOW (ref >60)
GFR, Estimated: 33 mL/min — ABNORMAL LOW (ref >60)
Glucose, Bld: 263 mg/dL — ABNORMAL HIGH (ref 70–99)
Glucose, Bld: 239 mg/dL — ABNORMAL HIGH (ref 70–99)
Potassium: 5.4 mmol/L — ABNORMAL HIGH (ref 3.5–5.1)
Potassium: 5.6 mmol/L — ABNORMAL HIGH (ref 3.5–5.1)
Sodium: 132 mmol/L — ABNORMAL LOW (ref 135–145)
Sodium: 131 mmol/L — ABNORMAL LOW (ref 135–145)

## 2023-03-27 LAB — CBC
HCT: 25.3 % — ABNORMAL LOW (ref 36.0–46.0)
Hemoglobin: 8 g/dL — ABNORMAL LOW (ref 12.0–15.0)
MCH: 30.1 pg (ref 26.0–34.0)
MCHC: 31.6 g/dL (ref 30.0–36.0)
MCV: 95.1 fL (ref 80.0–100.0)
Platelets: 101 10*3/uL — ABNORMAL LOW (ref 150–400)
RBC: 2.66 MIL/uL — ABNORMAL LOW (ref 3.87–5.11)
RDW: 18.9 % — ABNORMAL HIGH (ref 11.5–15.5)
WBC: 11.4 10*3/uL — ABNORMAL HIGH (ref 4.0–10.5)
nRBC: 0 % (ref 0.0–0.2)

## 2023-03-27 LAB — GLUCOSE, CAPILLARY
Glucose-Capillary: 255 mg/dL — ABNORMAL HIGH (ref 70–99)
Glucose-Capillary: 255 mg/dL — ABNORMAL HIGH (ref 70–99)
Glucose-Capillary: 253 mg/dL — ABNORMAL HIGH (ref 70–99)
Glucose-Capillary: 215 mg/dL — ABNORMAL HIGH (ref 70–99)

## 2023-03-27 MED ORDER — ADULT MULTIVITAMIN W/MINERALS CH
1.0000 | ORAL_TABLET | Freq: Every day | ORAL | Status: DC
  Administered 2023-03-27 – 2023-03-31 (×5): 1 via ORAL
  Filled 2023-03-27 (×5): qty 1

## 2023-03-27 MED ORDER — SODIUM CHLORIDE 0.9% FLUSH: 10.0000 mL | INTRAVENOUS | Status: DC | PRN

## 2023-03-27 MED ORDER — ENSURE ENLIVE PO LIQD
237.0000 mL | Freq: Two times a day (BID) | ORAL | Status: DC
  Administered 2023-03-27: 237 mL via ORAL

## 2023-03-27 NOTE — NC FL2 (Signed)
[place] MEDICAID FL2 LEVEL OF CARE FORM     IDENTIFICATION  Patient Name: Kathleen Gibson Birthdate: June 04, 1951 Sex: female Admission Date (Current Location): 03/25/2023  [place] and [place] Number:  Haynes Bast [id number] Facility and Address:  [facility],  [address], [place] [address]      Provider Number: [id number]  Attending Physician Name and Address:  Standley Brooking, MD  Relative Name and Phone Number:  Louanna Raw (daughter) Ph: [phone]    Current Level of Care: Hospital Recommended Level of Care: Skilled Nursing Facility Prior Approval Number:    Date Approved/Denied:   PASRR Number: [id number]  Discharge Plan: SNF    Current Diagnoses: Patient Active Problem List   Diagnosis Date Noted   Normocytic anemia 03/27/2023   S/P hip hemiarthroplasty 03/26/2023   Closed right hip fracture, initial encounter (HCC) 03/25/2023   Acute cystitis 03/25/2023   Liver cirrhosis (HCC) 02/14/2023   Skin rash 12/21/2022   Port-A-Cath in place 10/08/2021   Compression fracture of body of thoracic vertebra (HCC) 03/26/2021   Intractable back pain 03/07/2021   Red blood cell antibody positive 12/18/2020   Thrombocytopenia (HCC) 12/17/2020   [name] (chronic myelomonocytic leukemia) (HCC) 12/17/2020   Abdominal lymphadenopathy 01/09/2020   Periaortic lymphadenopathy 01/09/2020   Pelvic lymphadenopathy 01/09/2020   History of noncompliance with medical treatment 09/09/2019   Statin declined 08/09/2018   Polyarthralgia 07/31/2018   Tachycardia 10/25/2017   Iron deficiency 04/10/2017   B12 deficiency 04/06/2017   Hereditary and idiopathic peripheral neuropathy 05/25/2016   INSOMNIA, CHRONIC 10/06/2009   HLD (hyperlipidemia) 10/05/2007   ANEMIA, PERNICIOUS 03/02/2007   Type 2 diabetes mellitus with diabetic polyneuropathy, without long-term current use of insulin (HCC) 01/23/2007   Essential hypertension 12/27/2006    Orientation RESPIRATION  BLADDER Height & Weight     Self, Time, Situation, Place  Normal Incontinent Weight: 155 lb 4.8 oz (70.4 kg) Height:  5\' 2"  (157.5 cm)  BEHAVIORAL SYMPTOMS/MOOD NEUROLOGICAL BOWEL NUTRITION STATUS      Continent Diet (Regular diet)  AMBULATORY STATUS COMMUNICATION OF NEEDS Skin   Extensive Assist Verbally Surgical wounds, Other (Comment) (Ecchymosis: bilateral legs, arms; Rash: chest, back, bilateral arms; Scratches: abdomen, bilateral legs, arms)                       Personal Care Assistance Level of Assistance  Bathing, Feeding, Dressing Bathing Assistance: Maximum assistance Feeding assistance: Independent Dressing Assistance: Maximum assistance     Functional Limitations Info  Sight, Hearing, Speech Sight Info: Impaired Hearing Info: Adequate Speech Info: Adequate    SPECIAL CARE FACTORS FREQUENCY  PT (By licensed PT), OT (By licensed OT)     PT Frequency: 5x's/week OT Frequency: 5x's/week            Contractures Contractures Info: Not present    Additional Factors Info  Code Status, Allergies, Psychotropic, Insulin Sliding Scale Code Status Info: DNR Allergies Info: Asa (Aspirin), Nsaids, Red Blood Cells Psychotropic Info: Paxil Insulin Sliding Scale Info: See discharge summary       Current Medications (03/27/2023):  This is the current hospital active medication list Current Facility-Administered Medications  Medication Dose Route Frequency Provider Last Rate Last Admin   allopurinol (ZYLOPRIM) tablet 300 mg  300 mg Oral Daily Cassandria Anger, PA-C   300 mg at 03/27/23 0859   alum & mag hydroxide-simeth (MAALOX/MYLANTA) 200-200-20 MG/5ML suspension 30 mL  30 mL Oral Q4H PRN Cassandria Anger, PA-C  bisacodyl (DULCOLAX) suppository 10 mg  10 mg Rectal Daily PRN Cassandria Anger, PA-C       camphor-menthol [facility]) lotion   Topical PRN Cassandria Anger, PA-C   Given at 03/26/23 1308   Chlorhexidine Gluconate Cloth 2 % PADS 6 each  6 each Topical  Daily Cassandria Anger, PA-C   6 each at 03/27/23 0900   diphenhydrAMINE (BENADRYL) 12.5 MG/5ML elixir 12.5-25 mg  12.5-25 mg Oral Q4H PRN Cassandria Anger, PA-C       HYDROmorphone (DILAUDID) injection 0.5-1 mg  0.5-1 mg Intravenous Q4H PRN Cassandria Anger, PA-C       insulin aspart (novoLOG) injection 0-6 Units  0-6 Units Subcutaneous TID WC Steffanie Rainwater, MD   3 Units at 03/27/23 1238   lactated ringers infusion   Intravenous Continuous Cassandria Anger, PA-C 50 mL/hr at 03/26/23 1858 New Bag at 03/26/23 1858   lactulose (CHRONULAC) 10 GM/15ML solution 10 g  10 g Oral BID Cassandria Anger, PA-C   10 g at 03/27/23 0900   LORazepam (ATIVAN) tablet 1 mg  1 mg Oral Q8H PRN Cassandria Anger, PA-C       menthol-cetylpyridinium (CEPACOL) lozenge 3 mg  1 lozenge Oral PRN Cassandria Anger, PA-C       Or   phenol (CHLORASEPTIC) mouth spray 1 spray  1 spray Mouth/Throat PRN Cassandria Anger, PA-C       methocarbamol (ROBAXIN) tablet 500 mg  500 mg Oral Q6H PRN Rosalene Billings R, PA-C   500 mg at 03/27/23 0533   Or   methocarbamol (ROBAXIN) 500 mg in dextrose 5 % 50 mL IVPB  500 mg Intravenous Q6H PRN Cassandria Anger, PA-C       metoprolol succinate (TOPROL-XL) 24 hr tablet 25 mg  25 mg Oral Daily Cassandria Anger, PA-C   25 mg at 03/27/23 0900   mupirocin ointment (BACTROBAN) 2 % 1 Application  1 Application Nasal BID Cassandria Anger, PA-C   1 Application at 03/27/23 0901   ondansetron (ZOFRAN) tablet 4 mg  4 mg Oral Q6H PRN Cassandria Anger, PA-C       Or   ondansetron [facility]) injection 4 mg  4 mg Intravenous Q6H PRN Cassandria Anger, PA-C       oxyCODONE (Oxy IR/ROXICODONE) immediate release tablet 10 mg  10 mg Oral Q4H PRN Cassandria Anger, PA-C   10 mg at 03/27/23 1238   oxyCODONE (Oxy IR/ROXICODONE) immediate release tablet 2.5-5 mg  2.5-5 mg Oral Q4H PRN Cassandria Anger, PA-C       pantoprazole (PROTONIX) EC tablet 40 mg  40 mg Oral Daily Cassandria Anger, PA-C   40 mg at  03/27/23 0900   PARoxetine (PAXIL) tablet 10 mg  10 mg Oral Daily Cassandria Anger, PA-C   10 mg at 03/27/23 0900   pregabalin (LYRICA) capsule 150 mg  150 mg Oral BID Cassandria Anger, PA-C   150 mg at 03/27/23 0900   rivaroxaban (XARELTO) tablet 10 mg  10 mg Oral Q breakfast Cassandria Anger, PA-C   10 mg at 03/27/23 0900   senna (SENOKOT) tablet 8.6 mg  1 tablet Oral QHS Cassandria Anger, PA-C   8.6 mg at 03/26/23 2007   sodium chloride flush (NS) 0.9 % injection 10-40 mL  10-40 mL Intracatheter PRN Standley Brooking, MD       Facility-Administered Medications Ordered in Other Encounters  Medication  Dose Route Frequency Provider Last Rate Last Admin   0.9 %  sodium chloride infusion (Manually program via Guardrails IV Fluids)  250 mL Intravenous Once Malachy Mood, MD         Discharge Medications: Please see discharge summary for a list of discharge medications.  Relevant Imaging Results:  Relevant Lab Results:   Additional Information SSN: [id number]  Ewing Schlein, LCSW

## 2023-03-27 NOTE — Progress Notes (Signed)
Initial Nutrition Assessment  INTERVENTION:   -Ensure Plus High Protein po BID, each supplement provides 350 kcal and 20 grams of protein.   -Multivitamin with minerals daily  NUTRITION DIAGNOSIS:   Increased nutrient needs related to post-op healing, hip fracture as evidenced by estimated needs.  GOAL:   Patient will meet greater than or equal to 90% of their needs  MONITOR:   PO intake, Supplement acceptance, Labs, Weight trends, I & O's  REASON FOR ASSESSMENT:   Consult Hip fracture protocol  ASSESSMENT:   72 year old woman PMH including chronic myelomonocytic leukemia, recently discontinued treatment and entered hospice at home, diabetes mellitus type 2, presented with right hip pain after fall.  Admitted for hip fracture.  Patient unavailable at time of visit.  Per chart review, pt is s/p  right hip hemiarthroplasty 10/13. Pt with confusion today. Consuming 10% of meals at this time. Now on regular diet, will order Ensure supplements to aid post-op healing.  Per weight records,  pt with no recent weight changes.  Medications: Senokot, Lactulose, Decadron  Labs reviewed: CBGs: 79-255 Low Na Elevated K   NUTRITION - FOCUSED PHYSICAL EXAM:  Unable to complete at this time  Diet Order:   Diet Order             Diet regular Room service appropriate? Yes; Fluid consistency: Thin  Diet effective now                   EDUCATION NEEDS:   Not appropriate for education at this time  Skin:  Skin Assessment: Skin Integrity Issues: Skin Integrity Issues:: Incisions Incisions: 10/13 right hip  Last BM:  10/13 -type 3  Height:   Ht Readings from Last 1 Encounters:  03/25/23 5\' 2"  (1.575 m)    Weight:   Wt Readings from Last 1 Encounters:  03/25/23 70.4 kg    BMI:  Body mass index is 28.4 kg/m.  Estimated Nutritional Needs:   Kcal:  1700-1900  Protein:  80-90g  Fluid:  1.7L/day   Tilda Franco, MS, RD, LDN Inpatient Clinical  Dietitian Contact information available via Amion

## 2023-03-27 NOTE — Progress Notes (Addendum)
Progress Note   Patient: Kathleen Gibson ZOX:[id number] DOB: 1950-11-13 DOA: 03/25/2023     2 DOS: the patient was seen and examined on 03/27/2023   Brief hospital course: 72 year old woman PMH including chronic myelomonocytic leukemia, recently discontinued treatment and entered hospice at home, diabetes mellitus type 2, presented with right hip pain after fall.  Admitted for hip fracture.  Consultants Orthopedics  Procedures 10/13 right hip hemiarthroplasty  Assessment and Plan: Right hip fracture status post mechanical fall Nondisplaced right superior pubic ramus fracture S/p surgery 10/13 Management per orthopedics. Up with therapy - WBAT RLE. Xarelto for DVT prophylaxis given active cancer diagnosis and right hip hemiarthoplasty. RTC 2 weeks Therapy recommended   [name] Thrombocytopenia  Normocytic anemia ABLA perioperative in nature  Recently stopped treatment.  Has hospice at home Hgb down to 8.0, check CBC in AM Plts stable 101  AKI Hyperkalemia  Creatinine .89 on admission, up to 1.24 today. BUN up 19 > 32 K+ 5.4, mild. Secondary to AKI Push oral fluids, recheck BMP in PM and in AM   Type II DM  A1c was only 5.6% in July 2024.  Hold Januvia. CBG high secondary to dose of dexamethasone 10/13 SSI  Cirrhosis   Appears stable.  Continue lactulose    Essential hypertension  Continue Toprol     UTI Completed treatment   Prolonged QTc -- resolved Repeat EKG 10/13 shows resolution  Subjective:  Confused today per niece (Pt has h/o confusion with pain medication)  Physical Exam: Vitals:   03/26/23 1840 03/26/23 2113 03/27/23 0127 03/27/23 0525  BP: (!) 95/54 118/73 102/66 104/76  Pulse: 90 (!) 102 98 96  Resp: 18 17 16 16   Temp: (!) 97.1 F (36.2 C) 97.9 F (36.6 C) 97.9 F (36.6 C) 98 F (36.7 C)  TempSrc: Oral Oral Oral Oral  SpO2: 90% 98% 99% 97%  Weight:      Height:       Physical Exam Vitals reviewed.  Constitutional:      General: She is  not in acute distress.    Appearance: She is not ill-appearing or toxic-appearing.  Cardiovascular:     Rate and Rhythm: Normal rate and regular rhythm.     Heart sounds: No murmur heard. Pulmonary:     Effort: Pulmonary effort is normal. No respiratory distress.     Breath sounds: No wheezing, rhonchi or rales.  Neurological:     Mental Status: She is alert.  Psychiatric:     Comments: confused     Data Reviewed: K+ 5.4 Creatinine up 1.07 > 1.24 BUN up 19 > 32 Hgb down to 8.0 Plts stable 101  Family Communication: niece at bedside  Disposition: Status is: Inpatient Remains inpatient appropriate because: hip fracture     Time spent: 20 minutes  Author: Brendia Sacks, MD 03/27/2023 9:15 AM  For on call review [email or web address].

## 2023-03-27 NOTE — Evaluation (Signed)
Physical Therapy Evaluation Patient Details Name: Kathleen Gibson MRN: [id number] DOB: 08-24-1950 Today's Date: 03/27/2023  History of Present Illness  72 y.o. female with medical history significant for hypertension, hyperlipidemia, type 2 diabetes mellitus, and chronic myelomonocytic leukemia who recently discontinued treatment for the [name] and has been referred to hospice and now presents with right hip pain after fall. xray=right displaced femoral neck fracture.pt is now s/p  right hip hemiarthroplasty. posterior approach on 03/26/23 per Dr Charlann Boxer  Clinical Impression      Pt admitted with above diagnosis.  Pt  reports amb with RW at baseline, recently requiring assist with ADLs, pt is intermittently confused during session and having difficulty relaying her PLOF accurately.  Pt splits time between sister's home in [place]  and her dtr's home in [place]. Today she is requiring heavy max assist for bed mobility and transfers. I do not see her being able to return her prior routine anytime soon from a mobility standpoint; pt will need SNF vs return home with hospice and incr assist. Defer to [facility]   Pt currently with functional limitations due to the deficits listed below (see PT Problem List). Pt will benefit from acute skilled PT to increase their independence and safety with mobility to allow discharge.       If plan is discharge home, recommend the following: Two people to help with walking and/or transfers;Two people to help with bathing/dressing/bathroom;Help with stairs or ramp for entrance;Assist for transportation;Assistance with cooking/housework;Direct supervision/assist for financial management;Supervision due to cognitive status;Direct supervision/assist for medications management   Can travel by private vehicle   No    Equipment Recommendations None recommended by PT  Recommendations for Other Services       Functional Status Assessment Patient has had a recent decline in their functional  status and demonstrates the ability to make significant improvements in function in a reasonable and predictable amount of time.     Precautions / Restrictions Precautions Precautions: Fall Restrictions Weight Bearing Restrictions: No RLE Weight Bearing: Weight bearing as tolerated  No posterior THP    Mobility  Bed Mobility Overal bed mobility: Needs Assistance Bed Mobility: Supine to Sit     Supine to sit: Mod assist, Max assist     General bed mobility comments: bed pad used to assist scooting  laterally, assist to progress RLE off bed, elevate trunk and to scoot to EOB    Transfers Overall transfer level: Needs assistance Equipment used: Rolling walker (2 wheels) Transfers: Sit to/from Stand, Bed to chair/wheelchair/BSC Sit to Stand: From elevated surface, Max assist Stand pivot transfers: Max assist         General transfer comment: assist to stand and transition to RW, much difficulty wt shifting in standing; difficulty following commands/intermittent confusion with pt letting go of RW, difficulty processing cues    Ambulation/Gait               General Gait Details: unable  Stairs            Wheelchair Mobility     Tilt Bed    Modified Rankin (Stroke Patients Only)       Balance Overall balance assessment: History of Falls, Needs assistance Sitting-balance support: Single extremity supported, Feet supported Sitting balance-Leahy Scale: Fair     Standing balance support: Bilateral upper extremity supported Standing balance-Leahy Scale: Zero  Pertinent Vitals/Pain Pain Assessment Pain Assessment: Faces Faces Pain Scale: Hurts even more Pain Location: right hip Pain Descriptors / Indicators: Grimacing, Guarding Pain Intervention(s): Limited activity within patient's tolerance, Monitored during session, Repositioned    Home Living Family/patient expects to be discharged to:: Skilled nursing  facility Living Arrangements: Children;Other relatives (splits time between dtr's house in [place] and sister's house in [place]) Available Help at Discharge: Family;Available PRN/intermittently             Home Equipment: Rollator (4 wheels);Rolling Walker (2 wheels)      Prior Function Prior Level of Function : Patient poor historian/Family not available             Mobility Comments: pt reports amb with rollator, recently switched to RW. pt is unable to give complete hx regardign PLOF/home environment ADLs Comments: reports dtr recently assisting with ADLs     Extremity/Trunk Assessment   Upper Extremity Assessment Upper Extremity Assessment: Generalized weakness    Lower Extremity Assessment Lower Extremity Assessment: Generalized weakness;RLE deficits/detail RLE: Unable to fully assess due to pain       Communication   Communication Communication: Difficulty following commands/understanding;Difficulty communicating thoughts/reduced clarity of speech Cueing Techniques: Verbal cues;Tactile cues;Visual cues  Cognition Arousal: Alert Behavior During Therapy: WFL for tasks assessed/performed Overall Cognitive Status: No family/caregiver present to determine baseline cognitive functioning Area of Impairment: Orientation, Attention, Memory, Following commands, Problem solving                 Orientation Level: Disoriented to, Time (knows "hospital") Current Attention Level: Sustained Memory: Decreased short-term memory Following Commands: Follows one step commands inconsistently, Follows multi-step commands inconsistently     Problem Solving: Slow processing, Decreased initiation, Difficulty sequencing, Requires verbal cues, Requires tactile cues          General Comments      Exercises     Assessment/Plan    PT Assessment Patient needs continued PT services  PT Problem List Decreased strength;Decreased activity tolerance;Decreased balance;Decreased  mobility;Decreased knowledge of precautions;Decreased safety awareness;Decreased knowledge of use of DME;Pain;Decreased coordination;Decreased cognition;Decreased range of motion       PT Treatment Interventions DME instruction;Gait training;Functional mobility training;Therapeutic activities;Therapeutic exercise;Patient/family education;Cognitive remediation    PT Goals (Current goals can be found in the Care Plan section)  Acute Rehab PT Goals PT Goal Formulation: Patient unable to participate in goal setting Time For Goal Achievement: 04/10/23 Potential to Achieve Goals: Poor    Frequency Min 3X/week     Co-evaluation               AM-PAC PT "6 Clicks" Mobility  Outcome Measure Help needed turning from your back to your side while in a flat bed without using bedrails?: Total Help needed moving from lying on your back to sitting on the side of a flat bed without using bedrails?: A Lot Help needed moving to and from a bed to a chair (including a wheelchair)?: A Lot Help needed standing up from a chair using your arms (e.g., wheelchair or bedside chair)?: Total Help needed to walk in hospital room?: Total Help needed climbing 3-5 steps with a railing? : Total 6 Click Score: 8    End of Session Equipment Utilized During Treatment: Gait belt Activity Tolerance: Patient limited by fatigue;Patient limited by pain;Other (comment) (cognition) Patient left: in chair;with call bell/phone within reach;with chair alarm set;with nursing/sitter in room Nurse Communication: Mobility status PT Visit Diagnosis: Other abnormalities of gait and mobility (R26.89);Difficulty in walking, not elsewhere  classified (R26.2)    Time: 2440-1027 PT Time Calculation (min) (ACUTE ONLY): 21 min   Charges:   PT Evaluation $PT Eval Low Complexity: 1 Low   PT General Charges $$ ACUTE PT VISIT: 1 Visit         Meiling Hendriks, PT  Acute Rehab Dept [facility])  [phone]  03/27/2023   [facility] 03/27/2023, 12:01 PM

## 2023-03-27 NOTE — Plan of Care (Signed)
  Problem: Coping: Goal: Ability to adjust to condition or change in health will improve Outcome: Progressing   Problem: Pain Managment: Goal: General experience of comfort will improve Outcome: Progressing

## 2023-03-27 NOTE — TOC Initial Note (Signed)
Transition of Care [facility]) - Initial/Assessment Note   Patient Details  Name: Kathleen Gibson MRN: [id number] Date of Birth: 08-Dec-1950  Transition of Care [facility]) CM/SW Contact:    Ewing Schlein, LCSW Phone Number: 03/27/2023, 2:47 PM  Clinical Narrative: PT evaluation recommended SNF. CSW met with patient, who was lethargic and asleep, and patient's niece. Niece reported patient's daughter, Kathleen Gibson, is agreeable to SNF and requested that CSW follow up with her. CSW spoke with daughter and confirmed she is agreeable to SNF. Daughter requesting referral be sent to [facility] in [place] in addition to other SNFs.  FL2 done; PASRR confirmed. Initial referral faxed out in hub. CSW spoke with Toniann Fail in admissions at [facility] and she requested the referral be faxed to [phone]. Referral faxed for review. TOC awaiting bed offers.              Expected Discharge Plan: Skilled Nursing Facility Barriers to Discharge: Continued Medical Work up, SNF Pending bed offer, Insurance Authorization  Patient Goals and CMS Choice Patient states their goals for this hospitalization and ongoing recovery are:: Discharge to SNF CMS Medicare.gov Compare Post Acute Care list provided to:: Patient Represenative (must comment) Choice offered to / list presented to : Adult Children  Expected Discharge Plan and Services In-house Referral: Clinical Social Work Post Acute Care Choice: Skilled Nursing Facility Living arrangements for the past 2 months: Single Family Home            DME Arranged: N/A DME Agency: NA  Prior Living Arrangements/Services Living arrangements for the past 2 months: Single Family Home Lives with:: Adult Children Patient language and need for interpreter reviewed:: Yes Do you feel safe going back to the place where you live?: Yes      Need for Family Participation in Patient Care: Yes (Comment) Care giver support system in place?: Yes (comment) Criminal Activity/Legal  Involvement Pertinent to Current Situation/Hospitalization: No - Comment as needed  Activities of Daily Living ADL Screening (condition at time of admission) Independently performs ADLs?: No Does the patient have a NEW difficulty with bathing/dressing/toileting/self-feeding that is expected to last >3 days?: Yes (Initiates electronic notice to provider for possible OT consult) Does the patient have a NEW difficulty with getting in/out of bed, walking, or climbing stairs that is expected to last >3 days?: Yes (Initiates electronic notice to provider for possible PT consult) Does the patient have a NEW difficulty with communication that is expected to last >3 days?: No Is the patient deaf or have difficulty hearing?: No Does the patient have difficulty seeing, even when wearing glasses/contacts?: No Does the patient have difficulty concentrating, remembering, or making decisions?: No  Permission Sought/Granted Permission sought to share information with : Facility Industrial/product designer granted to share information with : Yes, Verbal Permission Granted Permission granted to share info w AGENCY: SNFs  Emotional Assessment Appearance:: Appears stated age Attitude/Demeanor/Rapport: Lethargic Orientation: : Oriented to Self, Oriented to Place, Oriented to  Time, Oriented to Situation Alcohol / Substance Use: Not Applicable Psych Involvement: No (comment)  Admission diagnosis:  Closed fracture of right hip, initial encounter (HCC) [S72.001A] Closed right hip fracture, initial encounter (HCC) [S72.001A] Patient Active Problem List   Diagnosis Date Noted   Normocytic anemia 03/27/2023   S/P hip hemiarthroplasty 03/26/2023   Closed right hip fracture, initial encounter (HCC) 03/25/2023   Acute cystitis 03/25/2023   Liver cirrhosis (HCC) 02/14/2023   Skin rash 12/21/2022   Port-A-Cath in place 10/08/2021  Compression fracture of body of thoracic vertebra (HCC) 03/26/2021    Intractable back pain 03/07/2021   Red blood cell antibody positive 12/18/2020   Thrombocytopenia (HCC) 12/17/2020   [name] (chronic myelomonocytic leukemia) (HCC) 12/17/2020   Abdominal lymphadenopathy 01/09/2020   Periaortic lymphadenopathy 01/09/2020   Pelvic lymphadenopathy 01/09/2020   History of noncompliance with medical treatment 09/09/2019   Statin declined 08/09/2018   Polyarthralgia 07/31/2018   Tachycardia 10/25/2017   Iron deficiency 04/10/2017   B12 deficiency 04/06/2017   Hereditary and idiopathic peripheral neuropathy 05/25/2016   INSOMNIA, CHRONIC 10/06/2009   HLD (hyperlipidemia) 10/05/2007   ANEMIA, PERNICIOUS 03/02/2007   Type 2 diabetes mellitus with diabetic polyneuropathy, without long-term current use of insulin (HCC) 01/23/2007   Essential hypertension 12/27/2006   PCP:  Natalia Leatherwood, DO Pharmacy:   [facility] [address] [place] - [address] [place] [address] Phone: [phone] Fax: [phone]  [facility] [address] [place] [address] Phone: [phone] Fax: [phone]  [phone] [place], [place] - [address] [place] [address] Phone: [phone] Fax: [phone]  Social Determinants of Health (SDOH) Social History: SDOH Screenings   Food Insecurity: No Food Insecurity (03/25/2023)  Housing: Low Risk  (03/25/2023)  Transportation Needs: No Transportation Needs (03/25/2023)  Utilities: Not At Risk (03/25/2023)  Depression (PHQ2-9): Low Risk  (12/27/2022)  Financial Resource Strain: Low Risk  (09/07/2021)  Physical Activity: Inactive (09/07/2021)  Social Connections: Socially Isolated (09/07/2021)  Stress: No Stress Concern Present (09/07/2021)  Tobacco Use: Low Risk  (03/25/2023)   SDOH Interventions:    Readmission Risk Interventions     No data to display

## 2023-03-27 NOTE — Progress Notes (Signed)
Patient ID: Kathleen Gibson, female   DOB: 13-Sep-1950, 72 y.o.   MRN: [id number] Subjective: 1 Day Post-Op Procedure(s) (LRB): ARTHROPLASTY BIPOLAR HIP (HEMIARTHROPLASTY) (Right)    Patient reports pain as moderate. No events noted over night Able to get to bedside commode last night  Objective:   VITALS:   Vitals:   03/27/23 0127 03/27/23 0525  BP: 102/66 104/76  Pulse: 98 96  Resp: 16 16  Temp: 97.9 F (36.6 C) 98 F (36.7 C)  SpO2: 99% 97%    Neurovascular intact Incision: dressing C/D/I  LABS Recent Labs    03/25/23 1648 03/26/23 0353 03/27/23 0339  HGB 9.6* 9.3* 8.0*  HCT 29.4* 28.7* 25.3*  WBC 4.0 4.0 11.4*  PLT 99* 98* 101*    Recent Labs    03/25/23 1648 03/26/23 0353 03/27/23 0339  NA 135 135 132*  K 3.8 3.8 5.4*  BUN 20 19 32*  CREATININE 0.89 1.07* 1.24*  GLUCOSE 123* 122* 263*    No results for input(s): "LABPT", "INR" in the last 72 hours.   Assessment/Plan: 1 Day Post-Op Procedure(s) (LRB): ARTHROPLASTY BIPOLAR HIP (HEMIARTHROPLASTY) (Right)   Advance diet Up with therapy - WBAT RLE Work with therapy to hopefull work towards safe home discharge given medical comorbidities and the fact that she is on hospice RTC in 2 weeks WBAT RLE Xarelto for DVT prophylaxis given active cancer diagnosis and right hip hemiarthoplasty

## 2023-03-28 ENCOUNTER — Inpatient Hospital Stay: Payer: Medicare HMO | Admitting: Licensed Clinical Social Worker

## 2023-03-28 DIAGNOSIS — D62 Acute posthemorrhagic anemia: Secondary | ICD-10-CM | POA: Diagnosis not present

## 2023-03-28 DIAGNOSIS — N179 Acute kidney failure, unspecified: Secondary | ICD-10-CM

## 2023-03-28 DIAGNOSIS — S72001A Fracture of unspecified part of neck of right femur, initial encounter for closed fracture: Secondary | ICD-10-CM | POA: Diagnosis not present

## 2023-03-28 DIAGNOSIS — C9310 Chronic myelomonocytic leukemia not having achieved remission: Secondary | ICD-10-CM

## 2023-03-28 DIAGNOSIS — Z96649 Presence of unspecified artificial hip joint: Secondary | ICD-10-CM

## 2023-03-28 LAB — CBC
HCT: 17.8 % — ABNORMAL LOW (ref 36.0–46.0)
Hemoglobin: 6.4 g/dL — CL (ref 12.0–15.0)
MCH: 34 pg (ref 26.0–34.0)
MCHC: 36 g/dL (ref 30.0–36.0)
MCV: 94.7 fL (ref 80.0–100.0)
Platelets: 113 10*3/uL — ABNORMAL LOW (ref 150–400)
RBC: 1.88 MIL/uL — ABNORMAL LOW (ref 3.87–5.11)
RDW: 18.6 % — ABNORMAL HIGH (ref 11.5–15.5)
WBC: 14.7 10*3/uL — ABNORMAL HIGH (ref 4.0–10.5)
nRBC: 0 % (ref 0.0–0.2)

## 2023-03-28 LAB — BASIC METABOLIC PANEL
Anion gap: 8 (ref 5–15)
Anion gap: 7 (ref 5–15)
BUN: 46 mg/dL — ABNORMAL HIGH (ref 8–23)
BUN: 49 mg/dL — ABNORMAL HIGH (ref 8–23)
CO2: 22 mmol/L (ref 22–32)
CO2: 21 mmol/L — ABNORMAL LOW (ref 22–32)
Calcium: 6.8 mg/dL — ABNORMAL LOW (ref 8.9–10.3)
Calcium: 6.7 mg/dL — ABNORMAL LOW (ref 8.9–10.3)
Chloride: 99 mmol/L (ref 98–111)
Chloride: 100 mmol/L (ref 98–111)
Creatinine, Ser: 1.84 mg/dL — ABNORMAL HIGH (ref 0.44–1.00)
Creatinine, Ser: 1.81 mg/dL — ABNORMAL HIGH (ref 0.44–1.00)
GFR, Estimated: 29 mL/min — ABNORMAL LOW (ref >60)
GFR, Estimated: 30 mL/min — ABNORMAL LOW (ref >60)
Glucose, Bld: 208 mg/dL — ABNORMAL HIGH (ref 70–99)
Glucose, Bld: 252 mg/dL — ABNORMAL HIGH (ref 70–99)
Potassium: 5.5 mmol/L — ABNORMAL HIGH (ref 3.5–5.1)
Potassium: 4.7 mmol/L (ref 3.5–5.1)
Sodium: 129 mmol/L — ABNORMAL LOW (ref 135–145)
Sodium: 128 mmol/L — ABNORMAL LOW (ref 135–145)

## 2023-03-28 LAB — GLUCOSE, CAPILLARY
Glucose-Capillary: 191 mg/dL — ABNORMAL HIGH (ref 70–99)
Glucose-Capillary: 272 mg/dL — ABNORMAL HIGH (ref 70–99)
Glucose-Capillary: 308 mg/dL — ABNORMAL HIGH (ref 70–99)
Glucose-Capillary: 302 mg/dL — ABNORMAL HIGH (ref 70–99)

## 2023-03-28 LAB — HEMOGLOBIN AND HEMATOCRIT, BLOOD
HCT: 20.6 % — ABNORMAL LOW (ref 36.0–46.0)
Hemoglobin: 7 g/dL — ABNORMAL LOW (ref 12.0–15.0)

## 2023-03-28 LAB — PREPARE RBC (CROSSMATCH)

## 2023-03-28 MED ORDER — ALLOPURINOL 100 MG PO TABS
100.0000 mg | ORAL_TABLET | Freq: Every day | ORAL | Status: DC
  Administered 2023-03-29 – 2023-03-31 (×3): 100 mg via ORAL
  Filled 2023-03-28 (×3): qty 1

## 2023-03-28 MED ORDER — PREGABALIN 75 MG PO CAPS
75.0000 mg | ORAL_CAPSULE | Freq: Two times a day (BID) | ORAL | Status: DC
  Administered 2023-03-28 – 2023-03-31 (×6): 75 mg via ORAL
  Filled 2023-03-28 (×6): qty 1

## 2023-03-28 MED ORDER — OXYCODONE HCL 5 MG PO TABS
5.0000 mg | ORAL_TABLET | ORAL | Status: DC | PRN
  Administered 2023-03-29 (×3): 5 mg via ORAL
  Filled 2023-03-28 (×6): qty 1

## 2023-03-28 MED ORDER — METHOCARBAMOL 1000 MG/10ML IJ SOLN: 500.0000 mg | Freq: Four times a day (QID) | INTRAVENOUS | Status: DC | PRN

## 2023-03-28 MED ORDER — SODIUM CHLORIDE 0.9% IV SOLUTION: Freq: Once | INTRAVENOUS | Status: AC

## 2023-03-28 MED ORDER — METHOCARBAMOL 500 MG PO TABS
500.0000 mg | ORAL_TABLET | Freq: Four times a day (QID) | ORAL | Status: DC | PRN
  Administered 2023-03-29 – 2023-03-30 (×2): 500 mg via ORAL
  Filled 2023-03-28 (×2): qty 1

## 2023-03-28 MED ORDER — SODIUM CHLORIDE 0.9 % IV BOLUS
500.0000 mL | Freq: Once | INTRAVENOUS | Status: AC
  Administered 2023-03-28: 500 mL via INTRAVENOUS

## 2023-03-28 MED ORDER — METHOCARBAMOL 500 MG PO TABS: 500.0000 mg | ORAL_TABLET | Freq: Four times a day (QID) | ORAL | Status: DC

## 2023-03-28 MED ORDER — SODIUM ZIRCONIUM CYCLOSILICATE 10 G PO PACK
10.0000 g | PACK | Freq: Once | ORAL | Status: AC
  Administered 2023-03-28: 10 g via ORAL
  Filled 2023-03-28: qty 1

## 2023-03-28 MED ORDER — METHOCARBAMOL 1000 MG/10ML IJ SOLN: 500.0000 mg | Freq: Four times a day (QID) | INTRAVENOUS | Status: DC

## 2023-03-28 NOTE — Progress Notes (Signed)
   Subjective: 2 Days Post-Op Procedure(s) (LRB): ARTHROPLASTY BIPOLAR HIP (HEMIARTHROPLASTY) (Right) Patient reports pain as mild.   Patient seen in rounds for Dr. Charlann Boxer. Patient is resting in bed on exam this morning. She is sleepy, but alert. She reports mild pain in her hip. She was not able to progress much with PT yesterday.   Objective: Vital signs in last 24 hours: Temp:  [97.7 F (36.5 C)-98.1 F (36.7 C)] 97.7 F (36.5 C) (10/15 0543) Pulse Rate:  [71-96] 71 (10/15 0543) Resp:  [15-17] 15 (10/15 0543) BP: (93-114)/(52-78) 93/58 (10/15 0543) SpO2:  [96 %-100 %] 100 % (10/15 0543)  Intake/Output from previous day:  Intake/Output Summary (Last 24 hours) at 03/28/2023 0721 Last data filed at 03/28/2023 0600 Gross per 24 hour  Intake 536.2 ml  Output 250 ml  Net 286.2 ml     Intake/Output this shift: No intake/output data recorded.  Labs: Recent Labs    03/25/23 1648 03/26/23 0353 03/27/23 0339 03/28/23 0230  HGB 9.6* 9.3* 8.0* 6.4*   Recent Labs    03/27/23 0339 03/28/23 0230  WBC 11.4* 14.7*  RBC 2.66* 1.88*  HCT 25.3* 17.8*  PLT 101* 113*   Recent Labs    03/27/23 1800 03/28/23 0230  NA 131* 129*  K 5.6* 5.5*  CL 101 99  CO2 22 22  BUN 39* 46*  CREATININE 1.64* 1.84*  GLUCOSE 239* 208*  CALCIUM 6.9* 6.8*   No results for input(s): "LABPT", "INR" in the last 72 hours.  Exam: General - Patient is Alert and Appropriate, sleepy Extremity - Neurologically intact Sensation intact distally Intact pulses distally Dorsiflexion/Plantar flexion intact Dressing - dressing C/D/I Motor Function - intact, moving foot and toes well on exam.   Past Medical History:  Diagnosis Date   Acute bronchitis due to COVID-19 virus 05/16/2022   Diabetes (HCC)    HLD (hyperlipidemia)    Hypertension    Pernicious anemia    Pneumonia 09/05/2019   Renal insufficiency 09/06/2019   Skin rash 12/21/2022   Vitamin D deficiency     Assessment/Plan: 2 Days  Post-Op Procedure(s) (LRB): ARTHROPLASTY BIPOLAR HIP (HEMIARTHROPLASTY) (Right) Principal Problem:   Closed right hip fracture, initial encounter (HCC) Active Problems:   Type 2 diabetes mellitus with diabetic polyneuropathy, without long-term current use of insulin (HCC)   Thrombocytopenia (HCC)   [name] (chronic myelomonocytic leukemia) (HCC)   Liver cirrhosis (HCC)   Acute cystitis   S/P hip hemiarthroplasty   Normocytic anemia  Estimated body mass index is 28.4 kg/m as calculated from the following:   Height as of this encounter: 5\' 2"  (1.575 m).   Weight as of this encounter: 70.4 kg. Advance diet Up with therapy  DVT Prophylaxis - Xarelto Weight bearing as tolerated.  ABLA on chronic anemia - Hgb 6.4 this morning - will get blood today  She is very sleepy this morning. She reports minimal pain, and we will try to back off her oxycodone to avoid confusion for her. If her pain is not controlled, we can make changes. I would prefer we used methocarbamol more often as I do feel this helps significantly in post op hip patients.   We will see what progress she makes with PT over the next few days.   Rosalene Billings, PA-C Orthopedic Surgery [phone] 03/28/2023, 7:21 AM

## 2023-03-28 NOTE — Progress Notes (Addendum)
Started 1unit PRBC transfusion. Vital signs taken and recorded per protocol. No adverse reactions noted.  11:03 transfusion completed. Pt is alert and oriented x 4, not in any distress, tolerated transfusion well, no complaints at this time, no adverse reactions noted.

## 2023-03-28 NOTE — TOC Progression Note (Signed)
Transition of Care [facility]) - Progression Note   Patient Details  Name: Kathleen Gibson MRN: [id number] Date of Birth: 01-03-1951  Transition of Care [facility]) CM/SW Contact  Ewing Schlein, LCSW Phone Number: 03/28/2023, 3:05 PM  Clinical Narrative: Patient has not received any bed offers in the hub at this time due to her insurance being out of network with most SNFs. CSW left VM for Toniann Fail in admissions at [facility] requesting call back.  Expected Discharge Plan: Skilled Nursing Facility Barriers to Discharge: Continued Medical Work up, SNF Pending bed offer, English as a second language teacher  Expected Discharge Plan and Services In-house Referral: Clinical Social Work Post Acute Care Choice: Skilled Nursing Facility Living arrangements for the past 2 months: Single Family Home             DME Arranged: N/A DME Agency: NA  Social Determinants of Health (SDOH) Interventions SDOH Screenings   Food Insecurity: No Food Insecurity (03/25/2023)  Housing: Low Risk  (03/25/2023)  Transportation Needs: No Transportation Needs (03/25/2023)  Utilities: Not At Risk (03/25/2023)  Depression (PHQ2-9): Low Risk  (12/27/2022)  Financial Resource Strain: Low Risk  (09/07/2021)  Physical Activity: Inactive (09/07/2021)  Social Connections: Socially Isolated (09/07/2021)  Stress: No Stress Concern Present (09/07/2021)  Tobacco Use: Low Risk  (03/25/2023)   Readmission Risk Interventions     No data to display

## 2023-03-28 NOTE — Progress Notes (Signed)
Progress Note   Patient: Kathleen Gibson WNU:[id number] DOB: [birth year] DOA: 03/25/2023     3 DOS: the patient was seen and examined on 03/28/2023   Brief hospital course: 72 year old woman PMH including chronic myelomonocytic leukemia, recently discontinued treatment and entered hospice at home, diabetes mellitus type 2, presented with right hip pain after fall.  Admitted for hip fracture.  Underwent operative intervention 10/13.  Postoperatively found to be anemic.  Transfusing blood today.  Plan SNF when stable.  Consultants Orthopedics  Procedures 10/13 right hip hemiarthroplasty  Assessment and Plan: Right hip fracture status post mechanical fall Nondisplaced right superior pubic ramus fracture S/p surgery 10/13 Management per orthopedics. Up with therapy - WBAT RLE. Xarelto for DVT prophylaxis given active cancer diagnosis and right hip hemiarthoplasty. RTC 2 weeks Therapy recommended SNF  AKI Hyperkalemia  Creatinine .89 on admission K+ 5.5, creatinine up to 1.84, BUN up to 46 1 dose Lokelma, bolus IV fluids, push oral fluids, giving 1 unit of blood. PM K+ 4.7, creatinine a bit better 1.81 BMP in AM  [name] Thrombocytopenia  Normocytic anemia ABLA perioperative in nature  Recently stopped treatment.  Has hospice at home Hgb down to 6.4 > transfuse 1 unit PRBC > 7.0. Will trend Hgb. Plts stable 113 CBC in AM   Mild hyponatremia Monitor   Type II DM  A1c was only 5.6% in July 2024.  Hold Januvia. CBG high.  Continue sliding scale insulin.   Cirrhosis   Appears stable.  Continue lactulose    Essential hypertension  Continue Toprol     UTI Completed treatment   Prolonged QTc -- resolved Repeat EKG 10/13 shows resolution    Subjective:  Feels mentally clearer today  Physical Exam: Vitals:   03/28/23 0838 03/28/23 1023 03/28/23 1103 03/28/23 1301  BP: 95/62 103/64 (!) 102/53 (!) 95/57  Pulse: 71 78 75 71  Resp: 16 17 14    Temp: 97.8 F (36.6 C)  97.6 F  (36.4 C)   TempSrc: Oral  Oral   SpO2: 100%  98%   Weight:      Height:       Physical Exam Vitals reviewed.  Constitutional:      General: She is not in acute distress.    Appearance: She is not ill-appearing or toxic-appearing.  Cardiovascular:     Rate and Rhythm: Normal rate and regular rhythm.     Heart sounds: No murmur heard. Pulmonary:     Effort: Pulmonary effort is normal. No respiratory distress.     Breath sounds: No wheezing, rhonchi or rales.  Neurological:     Mental Status: She is alert.  Psychiatric:        Mood and Affect: Mood normal.        Behavior: Behavior normal.     Data Reviewed: K+ 5.5 Creatinine up to 1.84 BUN up to 46 Hgb down to 6.4 Plts stable 113 WBC up to 14.7 Na+ satble 129  Family Communication: none present  Disposition: Status is: Inpatient Remains inpatient appropriate because: hip fracture     Time spent: 20 minutes  Author: Brendia Sacks, MD 03/28/2023 4:50 PM  For on call review [email or web address].

## 2023-03-28 NOTE — Progress Notes (Signed)
[facility] CSW Progress Note  Late entry from 10/14:  Clinical Social Worker  spoke w/ pt's daughter by phone.  Pt's daughter expressed concerns about pt discharging home from the hospital as she does not have support at home to assist her through recovery.  CSW informed pt's daughter she could stop home hospice services to allow pt to utilize Medicare benefits for SNF in order to rehabilitate from her fractured femur.  Post rehab pt's needs can be reassessed to determine the best discharge plan from SNF.  Medical team informed of the above and are exploring SNF placement.  CSW to remain available as appropriate.        Rachel Moulds, LCSW Clinical Social Worker [facility]

## 2023-03-28 NOTE — Plan of Care (Signed)
Problem: Education: Goal: Ability to describe self-care measures that may prevent or decrease complications (Diabetes Survival Skills Education) will improve Outcome: Progressing   Problem: Coping: Goal: Ability to adjust to condition or change in health will improve Outcome: Progressing   Problem: Fluid Volume: Goal: Ability to maintain a balanced intake and output will improve Outcome: Progressing   Problem: Health Behavior/Discharge Planning: Goal: Ability to identify and utilize available resources and services will improve Outcome: Progressing   Problem: Skin Integrity: Goal: Risk for impaired skin integrity will decrease Outcome: Progressing

## 2023-03-28 NOTE — Progress Notes (Unsigned)
[facility] CSW Progress Note  Visual merchandiser  spoke w/ pt's daughter by phone to answer questions regarding disposition.  Pt's daughter expressed concern regarding pt discharging home as there is no one available to provide the care she needs.  CSW informed pt's daughter pt could discontinue home hospice services to allow pt the option to admit to a SNF in order to recover from her recent femur fracture which occurred as the result of a fall.  Once pt has physically recovered her needs can be reassessed to appropriately discharge from the SNF.  Request sent to medical team to explore this option.  CSW to continue to follow as appropriate.        Rachel Moulds, LCSW Clinical Social Worker [facility]

## 2023-03-29 ENCOUNTER — Inpatient Hospital Stay: Payer: Medicare HMO

## 2023-03-29 ENCOUNTER — Encounter (HOSPITAL_COMMUNITY): Payer: Self-pay | Admitting: Family Medicine

## 2023-03-29 ENCOUNTER — Inpatient Hospital Stay (HOSPITAL_COMMUNITY): Payer: Medicare HMO

## 2023-03-29 DIAGNOSIS — S72001A Fracture of unspecified part of neck of right femur, initial encounter for closed fracture: Secondary | ICD-10-CM | POA: Diagnosis not present

## 2023-03-29 LAB — BPAM RBC
Blood Product Expiration Date: 202411102359
Blood Product Expiration Date: 202411102359
ISSUE DATE / TIME: 202410131552
ISSUE DATE / TIME: 202410150811
Unit Type and Rh: 202411102359
Unit Type and Rh: 5100
Unit Type and Rh: 5100

## 2023-03-29 LAB — TYPE AND SCREEN
ABO/RH(D): O POS
Antibody Screen: NEGATIVE
Unit division: 0
Unit division: 0

## 2023-03-29 LAB — GLUCOSE, CAPILLARY
Glucose-Capillary: 251 mg/dL — ABNORMAL HIGH (ref 70–99)
Glucose-Capillary: 246 mg/dL — ABNORMAL HIGH (ref 70–99)
Glucose-Capillary: 245 mg/dL — ABNORMAL HIGH (ref 70–99)
Glucose-Capillary: 249 mg/dL — ABNORMAL HIGH (ref 70–99)

## 2023-03-29 LAB — CBC
HCT: 21.2 % — ABNORMAL LOW (ref 36.0–46.0)
Hemoglobin: 7.2 g/dL — ABNORMAL LOW (ref 12.0–15.0)
MCH: 30.9 pg (ref 26.0–34.0)
MCHC: 34 g/dL (ref 30.0–36.0)
MCV: 91 fL (ref 80.0–100.0)
Platelets: 104 10*3/uL — ABNORMAL LOW (ref 150–400)
RBC: 2.33 MIL/uL — ABNORMAL LOW (ref 3.87–5.11)
RDW: 18.5 % — ABNORMAL HIGH (ref 11.5–15.5)
WBC: 14.8 10*3/uL — ABNORMAL HIGH (ref 4.0–10.5)
nRBC: 0.1 % (ref 0.0–0.2)

## 2023-03-29 LAB — BASIC METABOLIC PANEL
Anion gap: 8 (ref 5–15)
BUN: 51 mg/dL — ABNORMAL HIGH (ref 8–23)
CO2: 21 mmol/L — ABNORMAL LOW (ref 22–32)
Calcium: 6.9 mg/dL — ABNORMAL LOW (ref 8.9–10.3)
Chloride: 100 mmol/L (ref 98–111)
Creatinine, Ser: 1.71 mg/dL — ABNORMAL HIGH (ref 0.44–1.00)
GFR, Estimated: 32 mL/min — ABNORMAL LOW (ref >60)
Glucose, Bld: 287 mg/dL — ABNORMAL HIGH (ref 70–99)
Potassium: 4.4 mmol/L (ref 3.5–5.1)
Sodium: 129 mmol/L — ABNORMAL LOW (ref 135–145)

## 2023-03-29 MED ORDER — LIDOCAINE HCL 1 % IJ SOLN
INTRAMUSCULAR | Status: AC
  Filled 2023-03-29: qty 20

## 2023-03-29 MED ORDER — ORAL CARE MOUTH RINSE: 15.0000 mL | OROMUCOSAL | Status: DC | PRN

## 2023-03-29 MED ORDER — GLUCERNA SHAKE PO LIQD
237.0000 mL | Freq: Two times a day (BID) | ORAL | Status: DC
  Filled 2023-03-29 (×5): qty 237

## 2023-03-29 NOTE — TOC Progression Note (Addendum)
Transition of Care [facility]) - Progression Note   Patient Details  Name: Kathleen Gibson MRN: [id number] Date of Birth: [birth year]  Transition of Care [facility]) CM/SW Contact  Ewing Schlein, LCSW Phone Number: 03/29/2023, 1:21 PM  Clinical Narrative: CSW spoke with Toniann Fail in admissions at [facility]. Per Toniann Fail, the regional office informed her the facility will not take the patient back for rehab or LTC, so it was a firm denial for SNF. CSW updated daughter. Daughter agreeable to expanding the bed search to [facility]. CSW faxed out initial referral to additional SNFs. TOC awaiting bed offers.  Addendum: CSW provided bed offers to daughter with Medicare star ratings:  [facility] [address] [place], [place] [address] [phone] Overall rating ??  Below average  [facility] [address] [place], [place] [address] [phone] Overall rating ?? Below average  [facility] [address] [place], [place] [address] [phone] Overall rating ? Much below average  [facility] [address] [place], [place] [address] [phone] Overall rating ? Much below average  [facility] [address] [place], [place] [address] [phone] Overall rating ???? Above average  [facility] and [facility] [address] [place], [place] [address] [phone] Overall rating ?? Below average  Daughter requested [facility] due to star rating. Daughter reported the patient was not admitted to hospice, so there is no hospice to revoke at this time. CSW confirmed bed with [facility] in admissions. CSW completed insurance authorization on Availity portal. CSW provided authorization information to [place].  Expected Discharge Plan: Skilled Nursing Facility Barriers to Discharge: Continued  Medical Work up, SNF Pending bed offer, English as a second language teacher  Expected Discharge Plan and Services In-house Referral: Clinical Social Work Post Acute Care Choice: Skilled Nursing Facility Living arrangements for the past 2 months: Single Family Home            DME Arranged: N/A DME Agency: NA  Social Determinants of Health (SDOH) Interventions SDOH Screenings   Food Insecurity: No Food Insecurity (03/25/2023)  Housing: Low Risk  (03/25/2023)  Transportation Needs: No Transportation Needs (03/25/2023)  Utilities: Not At Risk (03/25/2023)  Depression (PHQ2-9): Low Risk  (12/27/2022)  Financial Resource Strain: Low Risk  (09/07/2021)  Physical Activity: Inactive (09/07/2021)  Social Connections: Socially Isolated (09/07/2021)  Stress: No Stress Concern Present (09/07/2021)  Tobacco Use: Low Risk  (03/25/2023)   Readmission Risk Interventions     No data to display

## 2023-03-29 NOTE — Progress Notes (Signed)
PROGRESS NOTE    Kathleen Gibson  WUJ:[id number] DOB: 1950/07/04 DOA: 03/25/2023 PCP: Natalia Leatherwood, DO   Brief Narrative:  72 year old female with history of chronic myelomonocytic leukemia, recently discontinued treatment and entered hospice at home, diabetes mellitus type 2, presented with right hip pain after fall. Admitted for hip fracture. Underwent operative intervention 10/13.  Needed 1 unit packed red cell transfusion on 03/28/2023.  PT recommended SNF placement.  Assessment & Plan:   Right hip fracture status post mechanical fall Nondisplaced right superior pubic ramus fracture S/p surgery 10/13 by orthopedics. Management per orthopedics. Up with therapy - WBAT RLE. Xarelto for DVT prophylaxis given active cancer diagnosis and right hip hemiarthoplasty. RTC 2 weeks PT recommended SNF placement.  Currently medically stable for discharge to SNF.  TOC following.   AKI Hyperkalemia: Resolved Creatinine .89 on admission.  Creatinine peaked to 1.84.  Improving to 1.71 today.  Treated with IV fluids.  Also received 1 unit packed red cell transfusion on 03/28/2023.  Monitor.   [name] Thrombocytopenia  Normocytic anemia ABLA perioperative in nature  Goals of care Recently stopped treatment and was at hospice at home Hgb down to 6.4 on 03/28/2023: Status post 1 unit packed red cell transfusion.  Hemoglobin 7.2 today.  Monitor.   Platelets on the lower side but stable. Consult palliative care for goals of care discussion  Leukocytosis -Monitor   Mild hyponatremia Monitor   Type II DM  A1c was only 5.6% in July 2024.  Hold Januvia. CBG high.  Continue sliding scale insulin.  Start long-acting insulin.   Cirrhosis of liver with ascites Had paracentesis x 2 recently on 03/15/2023 and 03/20/2023.  Abdomen looks distended.  Will request IR paracentesis again.  Continue lactulose.  Doubt that patient can tolerate diuretics because of blood pressure being on the lower side   Essential  hypertension  Continue Toprol     Prolonged QTc -- resolved Repeat EKG 10/13 shows resolution    DVT prophylaxis: SCDs Code Status: DNR Family Communication: None at bedside Disposition Plan: Status is: Inpatient Remains inpatient appropriate because: Of severity of illness  Consultants: Orthopedic.  Consult palliative care  Procedures: As above  Antimicrobials: Perioperative   Subjective: Patient seen and examined at bedside.  Denies worsening abdominal pain, vomiting.  Feels weak and tired.  Poor historian.  Objective: Vitals:   03/28/23 1301 03/28/23 1703 03/28/23 2258 03/29/23 0625  BP: (!) 95/57 (!) 97/56 113/66 108/62  Pulse: 71 89 78 81  Resp:   19 18  Temp:   97.9 F (36.6 C) (!) 97.5 F (36.4 C)  TempSrc:      SpO2:   99% 96%  Weight:      Height:        Intake/Output Summary (Last 24 hours) at 03/29/2023 1115 Last data filed at 03/29/2023 0600 Gross per 24 hour  Intake 1030.85 ml  Output 200 ml  Net 830.85 ml   Filed Weights   03/25/23 2019  Weight: 70.4 kg    Examination:  General exam: Appears calm and comfortable.  Looks chronically ill and deconditioned.  On room air.  Extremely thinly built. Respiratory system: Bilateral decreased breath sounds at bases with scattered crackles Cardiovascular system: S1 & S2 heard, Rate controlled Gastrointestinal system: Abdomen is distended, soft and nontender. Normal bowel sounds heard. Extremities: No cyanosis, clubbing; trace lower extremity edema  Central nervous system: Awake slow to respond, poor historian. No focal neurological deficits. Moving extremities Skin: No rashes, lesions or  ulcers Psychiatry: Flat affect.  Not agitated.  Data Reviewed: I have personally reviewed following labs and imaging studies  CBC: Recent Labs  Lab 03/25/23 1648 03/26/23 0353 03/27/23 0339 03/28/23 0230 03/28/23 1440 03/29/23 0111  WBC 4.0 4.0 11.4* 14.7*  --  14.8*  NEUTROABS 3.1  --   --   --   --   --    HGB 9.6* 9.3* 8.0* 6.4* 7.0* 7.2*  HCT 29.4* 28.7* 25.3* 17.8* 20.6* 21.2*  MCV 93.6 95.7 95.1 94.7  --  91.0  PLT 99* 98* 101* 113*  --  104*   Basic Metabolic Panel: Recent Labs  Lab 03/27/23 0339 03/27/23 1800 03/28/23 0230 03/28/23 1440 03/29/23 0111  NA 132* 131* 129* 128* 129*  K 5.4* 5.6* 5.5* 4.7 4.4  CL 103 101 99 100 100  CO2 21* 22 22 21* 21*  GLUCOSE 263* 239* 208* 252* 287*  BUN 32* 39* 46* 49* 51*  CREATININE 1.24* 1.64* 1.84* 1.81* 1.71*  CALCIUM 7.0* 6.9* 6.8* 6.7* 6.9*   GFR: Estimated Creatinine Clearance: 27.7 mL/min (A) (by C-G formula based on SCr of 1.71 mg/dL (H)). Liver Function Tests: No results for input(s): "AST", "ALT", "ALKPHOS", "BILITOT", "PROT", "ALBUMIN" in the last 168 hours. No results for input(s): "LIPASE", "AMYLASE" in the last 168 hours. No results for input(s): "AMMONIA" in the last 168 hours. Coagulation Profile: No results for input(s): "INR", "PROTIME" in the last 168 hours. Cardiac Enzymes: No results for input(s): "CKTOTAL", "CKMB", "CKMBINDEX", "TROPONINI" in the last 168 hours. BNP (last 3 results) No results for input(s): "PROBNP" in the last 8760 hours. HbA1C: No results for input(s): "HGBA1C" in the last 72 hours. CBG: Recent Labs  Lab 03/28/23 0756 03/28/23 1154 03/28/23 1700 03/28/23 2301 03/29/23 0749  GLUCAP 191* 272* 308* 302* 251*   Lipid Profile: No results for input(s): "CHOL", "HDL", "LDLCALC", "TRIG", "CHOLHDL", "LDLDIRECT" in the last 72 hours. Thyroid Function Tests: No results for input(s): "TSH", "T4TOTAL", "FREET4", "T3FREE", "THYROIDAB" in the last 72 hours. Anemia Panel: No results for input(s): "VITAMINB12", "FOLATE", "FERRITIN", "TIBC", "IRON", "RETICCTPCT" in the last 72 hours. Sepsis Labs: No results for input(s): "PROCALCITON", "LATICACIDVEN" in the last 168 hours.  Recent Results (from the past 240 hour(s))  Culture, Blood     Status: None   Collection Time: 03/20/23 10:20 AM    Specimen: Left Antecubital; Blood  Result Value Ref Range Status   Specimen Description   Final    LEFT ANTECUBITAL Performed at [facility], [address] [place], [place] [address]    Special Requests   Final    NONE Performed at [facility], [address] [place], [place] [address]    Culture   Final    NO GROWTH 5 DAYS Performed at [facility], [address] [place], [place] [address]    Report Status 03/25/2023 FINAL  Final  Culture, Blood     Status: None   Collection Time: 03/20/23 10:20 AM   Specimen: Right Antecubital; Blood  Result Value Ref Range Status   Specimen Description   Final    RIGHT ANTECUBITAL Performed at [facility], [address], [place], [place] [address]    Special Requests   Final    NONE Performed at [facility], [address], [place], [place] [address]    Culture   Final    NO GROWTH 5 DAYS Performed at [facility]  Lab, [address], [place], [place] [address]    Report Status 03/25/2023 FINAL  Final  Urine Culture     Status: Abnormal   Collection Time: 03/20/23 10:47 AM   Specimen: Urine, Clean Catch  Result Value Ref Range Status   Specimen Description   Final    URINE, CLEAN CATCH Performed at [facility], [address], [place], [place] [address]    Special Requests   Final    NONE Performed at [facility], [address] [place], [place] [address]    Culture MULTIPLE SPECIES PRESENT, SUGGEST RECOLLECTION (A)  Final   Report Status 03/21/2023 FINAL  Final  Surgical pcr screen     Status: Abnormal   Collection Time: 03/25/23 11:12 PM   Specimen: Nasal Mucosa; Nasal Swab  Result Value Ref Range Status   MRSA, PCR NEGATIVE NEGATIVE Final   Staphylococcus aureus POSITIVE (A) NEGATIVE Final    Comment: (NOTE) The Xpert SA Assay (FDA approved for NASAL  specimens in patients 18 years of age and older), is one component of a comprehensive surveillance program. It is not intended to diagnose infection nor to guide or monitor treatment. Performed at [facility], [address] [place], [place] [address]          Radiology Studies: No results found.      Scheduled Meds:  allopurinol  100 mg Oral Daily   Chlorhexidine Gluconate Cloth  6 each Topical Daily   feeding supplement  237 mL Oral BID BM   insulin aspart  0-6 Units Subcutaneous TID WC   lactulose  10 g Oral BID   metoprolol succinate  25 mg Oral Daily   multivitamin with minerals  1 tablet Oral Daily   mupirocin ointment  1 Application Nasal BID   pantoprazole  40 mg Oral Daily   PARoxetine  10 mg Oral Daily   pregabalin  75 mg Oral BID   rivaroxaban  10 mg Oral Q breakfast   senna  1 tablet Oral QHS   Continuous Infusions:  methocarbamol (ROBAXIN) IV            Glade Lloyd, MD [facility] 03/29/2023, 11:15 AM '

## 2023-03-29 NOTE — Progress Notes (Signed)
Physical Therapy Treatment Patient Details Name: Kathleen Gibson MRN: [id number] DOB: [birth year] Today's Date: 03/29/2023   History of Present Illness 72 y.o. female with medical history significant for hypertension, hyperlipidemia, type 2 diabetes mellitus, and chronic myelomonocytic leukemia who recently discontinued treatment for the [name] and has been referred to hospice and now presents with right hip pain after fall. xray=right displaced femoral neck fracture.pt is now s/p  right hip hemiarthroplasty. posterior approach on 03/26/23 per Dr Charlann Boxer    PT Comments  Pt is progressing toward goals, requiring less assist overall than previous session. Pt is able to amb short distances in room without incr pain. D/c plan remains appropriate. Continue PT in acute setting   If plan is discharge home, recommend the following: A lot of help with walking and/or transfers;A lot of help with bathing/dressing/bathroom;Assistance with cooking/housework;Assist for transportation;Help with stairs or ramp for entrance   Can travel by private vehicle     No  Equipment Recommendations  None recommended by PT    Recommendations for Other Services       Precautions / Restrictions Precautions Precautions: Fall Precaution Comments: no posterior hip precautions Restrictions Weight Bearing Restrictions: No RLE Weight Bearing: Weight bearing as tolerated     Mobility  Bed Mobility Overal bed mobility: Needs Assistance Bed Mobility: Supine to Sit     Supine to sit: Min assist, Mod assist     General bed mobility comments: bed pad used to assist scooting  laterally, assist to progress RLE off bed, elevate trunk; incr time with good    Transfers Overall transfer level: Needs assistance Equipment used: Rolling walker (2 wheels) Transfers: Sit to/from Stand Sit to Stand: Min assist, Mod assist           General transfer comment: cues for hand placement and assist to  transition to stand; STS  x 2  for toileting    Ambulation/Gait Ambulation/Gait assistance: Min assist Gait Distance (Feet): 10 Feet (5' more) Assistive device: Rolling walker (2 wheels) Gait Pattern/deviations: Step-to pattern       General Gait Details: cues for sequence, RW position. assist to steady when wt shifting   Stairs             Wheelchair Mobility     Tilt Bed    Modified Rankin (Stroke Patients Only)       Balance   Sitting-balance support: No upper extremity supported, Feet supported Sitting balance-Leahy Scale: Fair     Standing balance support: Bilateral upper extremity supported, During functional activity, Reliant on assistive device for balance Standing balance-Leahy Scale: Poor                              Cognition Arousal: Alert Behavior During Therapy: WFL for tasks assessed/performed Overall Cognitive Status: Within Functional Limits for tasks assessed                                 General Comments: pt Ox3 today, able to follow one step commands in a reasonable amount of time, communicate needs, converse appropriately        Exercises General Exercises - Lower Extremity Heel Slides: Right, 10 reps, AAROM    General Comments        Pertinent Vitals/Pain Pain Assessment Pain Assessment: Faces Pain Location: right hip Pain Descriptors / Indicators: Grimacing, Sore Pain Intervention(s): Limited activity within patient's  tolerance, Monitored during session, Repositioned    Home Living                          Prior Function            PT Goals (current goals can now be found in the care plan section) Acute Rehab PT Goals PT Goal Formulation: With patient/family Time For Goal Achievement: 04/10/23 Potential to Achieve Goals: Fair Progress towards PT goals: Progressing toward goals    Frequency    Min 3X/week      PT Plan      Co-evaluation              AM-PAC PT "6 Clicks" Mobility   Outcome  Measure  Help needed turning from your back to your side while in a flat bed without using bedrails?: A Lot Help needed moving from lying on your back to sitting on the side of a flat bed without using bedrails?: A Lot Help needed moving to and from a bed to a chair (including a wheelchair)?: A Lot Help needed standing up from a chair using your arms (e.g., wheelchair or bedside chair)?: A Lot Help needed to walk in hospital room?: A Lot Help needed climbing 3-5 steps with a railing? : Total 6 Click Score: 11    End of Session Equipment Utilized During Treatment: Gait belt Activity Tolerance: Patient tolerated treatment well Patient left: in chair;with call bell/phone within reach;with chair alarm set Nurse Communication: Mobility status PT Visit Diagnosis: Other abnormalities of gait and mobility (R26.89);Difficulty in walking, not elsewhere classified (R26.2)     Time: 1610-9604 PT Time Calculation (min) (ACUTE ONLY): 23 min  Charges:    $Gait Training: 8-22 mins $Therapeutic Exercise: 8-22 mins PT General Charges $$ ACUTE PT VISIT: 1 Visit                     Taha Dimond, PT  Acute Rehab Dept [facility]) [phone]  03/29/2023    [facility] 03/29/2023, 12:58 PM

## 2023-03-29 NOTE — Inpatient Diabetes Management (Signed)
Inpatient Diabetes Program Recommendations  AACE/ADA: New Consensus Statement on Inpatient Glycemic Control (2015)  Target Ranges:  Prepandial:   less than 140 mg/dL      Peak postprandial:   less than 180 mg/dL (1-2 hours)      Critically ill patients:  140 - 180 mg/dL     Review of Glycemic Control  Latest Reference Range & Units 03/27/23 21:29 03/28/23 07:56 03/28/23 11:54 03/28/23 17:00 03/28/23 23:01 03/29/23 07:49  Glucose-Capillary 70 - 99 mg/dL 213 (H) 086 (H) 578 (H) 308 (H) 302 (H) 251 (H)   Diabetes history: DM  Outpatient Diabetes medications:  Januvia- Not taking Current orders for Inpatient glycemic control:  Novolog 0-6 units tid with meals   Inpatient Diabetes Program Recommendations:    If appropriate, may consider adding Semglee 8 units daily.   Thanks,  Beryl Meager, RN, BC-ADM Inpatient Diabetes Coordinator Pager [phone]  (8a-5p)

## 2023-03-29 NOTE — Progress Notes (Signed)
Patient ID: Kathleen Gibson, female   DOB: 1951-03-17, 72 y.o.   MRN: [id number] Subjective: 3 Days Post-Op Procedure(s) (LRB): ARTHROPLASTY BIPOLAR HIP (HEMIARTHROPLASTY) (Right)    Patient reports pain as moderate. Slow progress predominantly related to medical comorbidities   Objective:   VITALS:   Vitals:   03/28/23 2258 03/29/23 0625  BP: 113/66 108/62  Pulse: 78 81  Resp: 19 18  Temp: 97.9 F (36.6 C) (!) 97.5 F (36.4 C)  SpO2: 99% 96%    Neurovascular intact Incision: moderate drainage - dressing removed and reapplied a new Aquacell dressing  LABS Recent Labs    03/27/23 0339 03/28/23 0230 03/28/23 1440 03/29/23 0111  HGB 8.0* 6.4* 7.0* 7.2*  HCT 25.3* 17.8* 20.6* 21.2*  WBC 11.4* 14.7*  --  14.8*  PLT 101* 113*  --  104*    Recent Labs    03/28/23 0230 03/28/23 1440 03/29/23 0111  NA 129* 128* 129*  K 5.5* 4.7 4.4  BUN 46* 49* 51*  CREATININE 1.84* 1.81* 1.71*  GLUCOSE 208* 252* 287*    No results for input(s): "LABPT", "INR" in the last 72 hours.   Assessment/Plan: 3 Days Post-Op Procedure(s) (LRB): ARTHROPLASTY BIPOLAR HIP (HEMIARTHROPLASTY) (Right)   Up with therapy WBAT  RTC in 2 weeks Xarelto prescribed due to active cancer but has lead to some wound site oozing

## 2023-03-29 NOTE — Procedures (Signed)
Ultrasound-guided therapeutic paracentesis performed yielding 6 liters of yellow fluid. No immediate complications. EBL none.

## 2023-03-30 DIAGNOSIS — Z66 Do not resuscitate: Secondary | ICD-10-CM | POA: Diagnosis not present

## 2023-03-30 DIAGNOSIS — Z515 Encounter for palliative care: Secondary | ICD-10-CM

## 2023-03-30 DIAGNOSIS — Z7189 Other specified counseling: Secondary | ICD-10-CM

## 2023-03-30 DIAGNOSIS — S72001A Fracture of unspecified part of neck of right femur, initial encounter for closed fracture: Secondary | ICD-10-CM | POA: Diagnosis not present

## 2023-03-30 LAB — CBC WITH DIFFERENTIAL/PLATELET
Abs Immature Granulocytes: 1.44 10*3/uL — ABNORMAL HIGH (ref 0.00–0.07)
Basophils Absolute: 0 10*3/uL (ref 0.0–0.1)
Basophils Relative: 0 %
Eosinophils Absolute: 0.1 10*3/uL (ref 0.0–0.5)
Eosinophils Relative: 1 %
HCT: 19.3 % — ABNORMAL LOW (ref 36.0–46.0)
Hemoglobin: 6.6 g/dL — CL (ref 12.0–15.0)
Immature Granulocytes: 15 %
Lymphocytes Relative: 10 %
Lymphs Abs: 1 10*3/uL (ref 0.7–4.0)
MCH: 32.4 pg (ref 26.0–34.0)
MCHC: 34.2 g/dL (ref 30.0–36.0)
MCV: 94.6 fL (ref 80.0–100.0)
Monocytes Absolute: 1.6 10*3/uL — ABNORMAL HIGH (ref 0.1–1.0)
Monocytes Relative: 16 %
Neutro Abs: 5.6 10*3/uL (ref 1.7–7.7)
Neutrophils Relative %: 58 %
Platelets: 92 10*3/uL — ABNORMAL LOW (ref 150–400)
RBC: 2.04 MIL/uL — ABNORMAL LOW (ref 3.87–5.11)
RDW: 18.3 % — ABNORMAL HIGH (ref 11.5–15.5)
WBC: 9.8 10*3/uL (ref 4.0–10.5)
nRBC: 0.4 % — ABNORMAL HIGH (ref 0.0–0.2)

## 2023-03-30 LAB — COMPREHENSIVE METABOLIC PANEL
ALT: 11 U/L (ref 0–44)
AST: 20 U/L (ref 15–41)
Albumin: 1.9 g/dL — ABNORMAL LOW (ref 3.5–5.0)
Alkaline Phosphatase: 120 U/L (ref 38–126)
Anion gap: 5 (ref 5–15)
BUN: 43 mg/dL — ABNORMAL HIGH (ref 8–23)
CO2: 23 mmol/L (ref 22–32)
Calcium: 7.1 mg/dL — ABNORMAL LOW (ref 8.9–10.3)
Chloride: 99 mmol/L (ref 98–111)
Creatinine, Ser: 1.26 mg/dL — ABNORMAL HIGH (ref 0.44–1.00)
GFR, Estimated: 46 mL/min — ABNORMAL LOW (ref >60)
Glucose, Bld: 197 mg/dL — ABNORMAL HIGH (ref 70–99)
Potassium: 3.5 mmol/L (ref 3.5–5.1)
Sodium: 127 mmol/L — ABNORMAL LOW (ref 135–145)
Total Bilirubin: 1.2 mg/dL (ref 0.3–1.2)
Total Protein: 5.5 g/dL — ABNORMAL LOW (ref 6.5–8.1)

## 2023-03-30 LAB — GLUCOSE, CAPILLARY
Glucose-Capillary: 175 mg/dL — ABNORMAL HIGH (ref 70–99)
Glucose-Capillary: 199 mg/dL — ABNORMAL HIGH (ref 70–99)
Glucose-Capillary: 118 mg/dL — ABNORMAL HIGH (ref 70–99)
Glucose-Capillary: 247 mg/dL — ABNORMAL HIGH (ref 70–99)

## 2023-03-30 LAB — PREPARE RBC (CROSSMATCH)

## 2023-03-30 LAB — HEMOGLOBIN AND HEMATOCRIT, BLOOD
HCT: 25.1 % — ABNORMAL LOW (ref 36.0–46.0)
Hemoglobin: 8.5 g/dL — ABNORMAL LOW (ref 12.0–15.0)

## 2023-03-30 LAB — MAGNESIUM: Magnesium: 2.1 mg/dL (ref 1.7–2.4)

## 2023-03-30 MED ORDER — PREGABALIN 75 MG PO CAPS: 75.0000 mg | ORAL_CAPSULE | Freq: Two times a day (BID) | ORAL | 0 refills | Status: DC

## 2023-03-30 MED ORDER — METHOCARBAMOL 500 MG PO TABS: 500.0000 mg | ORAL_TABLET | Freq: Four times a day (QID) | ORAL | 0 refills | Status: DC | PRN

## 2023-03-30 MED ORDER — FUROSEMIDE 20 MG PO TABS: 20.0000 mg | ORAL_TABLET | ORAL | Status: DC

## 2023-03-30 MED ORDER — POTASSIUM CHLORIDE CRYS ER 10 MEQ PO TBCR: 10.0000 meq | EXTENDED_RELEASE_TABLET | ORAL | Status: DC

## 2023-03-30 MED ORDER — RIVAROXABAN 10 MG PO TABS: 10.0000 mg | ORAL_TABLET | Freq: Every day | ORAL | 0 refills | Status: DC

## 2023-03-30 MED ORDER — POLYETHYLENE GLYCOL 3350 17 GM/SCOOP PO POWD: 17.0000 g | Freq: Every day | ORAL | Status: DC | PRN

## 2023-03-30 MED ORDER — SODIUM CHLORIDE 0.9% IV SOLUTION: Freq: Once | INTRAVENOUS | Status: AC

## 2023-03-30 MED ORDER — CHLORHEXIDINE GLUCONATE CLOTH 2 % EX PADS
6.0000 | MEDICATED_PAD | Freq: Every day | CUTANEOUS | Status: DC
  Administered 2023-03-30: 6 via TOPICAL

## 2023-03-30 MED ORDER — LORAZEPAM 1 MG PO TABS: 1.0000 mg | ORAL_TABLET | Freq: Three times a day (TID) | ORAL | 0 refills | Status: DC | PRN

## 2023-03-30 MED ORDER — OXYCODONE HCL 5 MG PO TABS: 5.0000 mg | ORAL_TABLET | ORAL | 0 refills | Status: DC | PRN

## 2023-03-30 NOTE — Discharge Summary (Addendum)
Physician Discharge Summary  Kathleen Gibson VZD:[id number] DOB: [birth year] DOA: 03/25/2023  PCP: Natalia Leatherwood, DO  Admit date: 03/25/2023 Discharge date: 03/31/2023  Admitted From: Home Disposition: SNF  Recommendations for Outpatient Follow-up:  Follow up with SNF provider at earliest convenience Outpatient follow-up with orthopedics.  Wound care/activity/DVT prophylaxis/pain management as per orthopedics Outpatient follow-up with palliative care Follow up in ED if symptoms worsen or new appear   Home Health: No Equipment/Devices: None  Discharge Condition: Guarded to poor CODE STATUS: DNR Diet recommendation: Heart healthy/fluid restriction of up to 1200 cc a day  Brief/Interim Summary: 72 year old female with history of chronic myelomonocytic leukemia, recently discontinued treatment and entered hospice at home, diabetes mellitus type 2, presented with right hip pain after fall. Admitted for hip fracture. Underwent operative intervention 10/13.  Needed 1 unit packed red cell transfusion on 03/28/2023.  PT recommended SNF placement.  Hemoglobin 6.6 this morning.  She will be transfused 1 unit packed red cells again today.  She had ultrasound-guided paracentesis and removal of 6 L of yellow fluid on 03/29/2023.  She will be discharged to SNF today after transfusion if hemoglobin remained stable afterwards.  Overall prognosis is very poor.  Recommend outpatient follow-up with palliative care and SNF as well.  Addendum on 03/31/2023 Patient is waiting to be discharged to SNF. She had 1 unit packed red cells transfused on 03/30/2023 and repeat H&H afterwards was 8.5. She is currently medically stable for discharge to SNF although her prognosis is very poor.   Discharge Diagnoses:   Right hip fracture status post mechanical fall Nondisplaced right superior pubic ramus fracture S/p surgery 10/13 by orthopedics. Management per orthopedics. Up with therapy - WBAT RLE. Xarelto for DVT  prophylaxis given active cancer diagnosis and right hip hemiarthoplasty. RTC 2 weeks PT recommended SNF placement.  Discharge to SNF later today after transfusion if hemoglobin remained stable after transfusion   AKI Hyperkalemia: Resolved Creatinine .89 on admission.  Creatinine peaked to 1.84.  Improving to 1.26 today.  Treated with IV fluids and then discontinued.  Also received 1 unit packed red cell transfusion on 03/28/2023.  Outpatient follow-up.  Spironolactone to remain on hold.  Resume Lasix 3 times a week as prior to admission   [name] Thrombocytopenia  Normocytic anemia ABLA perioperative in nature  Goals of care Recently stopped treatment and was at hospice at home Hgb down to 6.4 on 03/28/2023: Status post 1 unit packed red cell transfusion.  Hemoglobin 6.6 today.  Transfuse 1 more unit packed red cells today. Platelets on the lower side but stable. Palliative care consultation pending: This can happen as an outpatient.   Leukocytosis -Resolved   hyponatremia Sodium 127 today.  This can be followed up as an outpatient.  Resume Lasix on discharge as discussed above.   Type II DM  A1c was only 5.6% in July 2024.  Resume Januvia. Carb modified diet  Cirrhosis of liver with ascites Had paracentesis x 2 recently on 03/15/2023 and 03/20/2023.   She had ultrasound-guided paracentesis and removal of 6 L of yellow fluid on 03/29/2023.  Diuretics plan as above.  Essential hypertension  Continue Toprol     Prolonged QTc -- resolved Repeat EKG 10/13 shows resolution  Discharge Instructions  Discharge Instructions     Amb Referral to Palliative Care   Complete by: As directed    Diet - low sodium heart healthy   Complete by: As directed    Diet Carb Modified   Complete by:  As directed    Increase activity slowly   Complete by: As directed       Allergies as of 03/30/2023       Reactions   Asa [aspirin] Other (See Comments)   Contraindicated d/t low plts   Nsaids  Other (See Comments)   Contraindicated d/t low plts   Red Blood Cells Nausea Only, Other (See Comments)   Near end of transfusion pt developed nausea, facial flushing, and tachycardia. See progress note from 03/01/22        Medication List     STOP taking these medications    ciprofloxacin 250 MG tablet Commonly known as: Cipro   fluocinonide cream 0.05 % Commonly known as: LIDEX   lidocaine 4 % Commonly known as: HM Lidocaine Patch   spironolactone 25 MG tablet Commonly known as: ALDACTONE   triamcinolone ointment 0.5 % Commonly known as: KENALOG       TAKE these medications    allopurinol 300 MG tablet Commonly known as: ZYLOPRIM Take 300 mg by mouth daily.   furosemide 20 MG tablet Commonly known as: LASIX Take 1 tablet (20 mg total) by mouth 3 (three) times a week. Start taking on: March 31, 2023 What changed: See the new instructions.   hydrOXYzine 10 MG tablet Commonly known as: ATARAX Take 10 mg by mouth as needed for itching.   lactulose 10 GM/15ML solution Commonly known as: CHRONULAC Take 30 mLs (20 g total) by mouth 2 (two) times daily as needed for mild constipation. As needed to maintain bowel movement 1-3 times a day What changed:  how much to take when to take this   lidocaine-prilocaine cream Commonly known as: EMLA APPLY 2 GRAMS TO PORT-A-CATH SITE 30-60 MINUTES PRIOR TO PORT ACCESS AS NEEDED What changed:  how much to take how to take this when to take this reasons to take this additional instructions   LORazepam 1 MG tablet Commonly known as: ATIVAN Take 1 tablet (1 mg total) by mouth every 8 (eight) hours as needed. for anxiety What changed:  when to take this reasons to take this additional instructions   Magnesium 400 MG Tabs Take 400 mg by mouth in the morning and at bedtime.   methocarbamol 500 MG tablet Commonly known as: ROBAXIN Take 1 tablet (500 mg total) by mouth every 6 (six) hours as needed for muscle  spasms.   metoprolol succinate 25 MG 24 hr tablet Commonly known as: TOPROL-XL Take 1 tablet (25 mg total) by mouth daily.   Narcan 4 MG/0.1ML Liqd nasal spray kit Generic drug: naloxone Place 1 spray into the nose once as needed (overdose).   omeprazole 20 MG capsule Commonly known as: PRILOSEC Take 20 mg by mouth daily.   ondansetron 8 MG disintegrating tablet Commonly known as: ZOFRAN-ODT Take 0.5 tablets (4 mg total) by mouth every 8 (eight) hours as needed for nausea or vomiting.   onetouch ultrasoft lancets Monitor blood sugars twice daily   OneTouch Verio test strip Generic drug: glucose blood Monitor blood sugars twice daily   oxyCODONE 5 MG immediate release tablet Commonly known as: Oxy IR/ROXICODONE Take 1 tablet (5 mg total) by mouth every 4 (four) hours as needed for severe pain (pain score 7-10) or moderate pain (pain score 4-6) (pain score 7-10). What changed:  medication strength how much to take when to take this reasons to take this   PARoxetine 10 MG tablet Commonly known as: PAXIL Take 10 mg by mouth daily.  polyethylene glycol powder 17 GM/SCOOP powder Commonly known as: GLYCOLAX/MIRALAX Take 17 g by mouth daily as needed for mild constipation or moderate constipation.   potassium chloride 10 MEQ tablet Commonly known as: Klor-Con M10 Take 1 tablet (10 mEq total) by mouth 3 (three) times a week. Start taking on: March 31, 2023 What changed: See the new instructions.   pregabalin 75 MG capsule Commonly known as: LYRICA Take 1 capsule (75 mg total) by mouth 2 (two) times daily. What changed:  medication strength how much to take   prochlorperazine 10 MG tablet Commonly known as: COMPAZINE Take 1 tablet (10 mg total) by mouth every 6 (six) hours as needed.   rivaroxaban 10 MG Tabs tablet Commonly known as: XARELTO Take 1 tablet (10 mg total) by mouth daily with breakfast. Start taking on: March 31, 2023   sitaGLIPtin 50 MG  tablet Commonly known as: Januvia Take 1 tablet (50 mg total) by mouth daily.        Contact information for follow-up providers     Durene Romans, MD. Schedule an appointment as soon as possible for a visit in 2 week(s).   Specialty: Orthopedic Surgery Contact information: [address] [place] [address] [phone]              Contact information for after-discharge care     Destination     [facility] .   Service: Skilled Nursing Contact information: [address] [place] [address] [phone]                    Allergies  Allergen Reactions   Jonne Ply [Aspirin] Other (See Comments)    Contraindicated d/t low plts   Nsaids Other (See Comments)    Contraindicated d/t low plts   Red Blood Cells Nausea Only and Other (See Comments)    Near end of transfusion pt developed nausea, facial flushing, and tachycardia. See progress note from 03/01/22    Consultations: Orthopedics.  Palliative care consultation pending.   Procedures/Studies: US Paracentesis  Result Date: 03/29/2023 INDICATION: Patient with history of chronic myelomonocytic leukemia, cirrhosis, recurrent ascites; request received for therapeutic paracentesis. EXAM: ULTRASOUND GUIDED THERAPEUTIC  PARACENTESIS MEDICATIONS: 8 ml 1% lidocaine COMPLICATIONS: None immediate. PROCEDURE: Informed written consent was obtained from the patient after a discussion of the risks, benefits and alternatives to treatment. A timeout was performed prior to the initiation of the procedure. Initial ultrasound scanning demonstrates a large amount of ascites within the right lower abdominal quadrant. The right lower abdomen was prepped and draped in the usual sterile fashion. 1% lidocaine was used for local anesthesia. Following this, a 19 gauge, 10-cm, Yueh catheter was introduced. An ultrasound image was saved for documentation purposes. The paracentesis was  performed. The catheter was removed and a dressing was applied. The patient tolerated the procedure well without immediate post procedural complication. FINDINGS: A total of approximately 6 liters of slightly hazy, yellow fluid was removed. IMPRESSION: Successful ultrasound-guided therapeutic paracentesis yielding 6 liters of peritoneal fluid. PLAN: The patient has required >/=2 paracenteses in a 30 day period and a formal evaluation by the [facility] Interventional Radiology [facility] has been arranged. Performed by: Artemio Aly Electronically Signed   By: Acquanetta Belling M.D.   On: 03/29/2023 16:35   DG Pelvis Portable  Result Date: 03/26/2023 CLINICAL DATA:  Post total hip arthroplasty. EXAM: PORTABLE PELVIS 1-2 VIEWS COMPARISON:  Right hip x-ray 03/25/2023 FINDINGS: There is a new  right hip arthroplasty in anatomic alignment. There surrounding soft tissue swelling and air compatible with recent surgery. The bones are osteopenic. Peripheral vascular calcifications are present. IMPRESSION: Right hip arthroplasty in anatomic alignment. Electronically Signed   By: Darliss Cheney M.D.   On: 03/26/2023 20:51   CT Hip Right Wo Contrast  Result Date: 03/25/2023 CLINICAL DATA:  Fall, right hip pain EXAM: CT OF THE RIGHT HIP WITHOUT CONTRAST TECHNIQUE: Multidetector CT imaging of the right hip was performed according to the standard protocol. Multiplanar CT image reconstructions were also generated. RADIATION DOSE REDUCTION: This exam was performed according to the departmental dose-optimization program which includes automated exposure control, adjustment of the mA and/or kV according to patient size and/or use of iterative reconstruction technique. COMPARISON:  Plain films earlier today FINDINGS: Bones/Joint/Cartilage There is a right femoral neck fracture. Minimal displacement. No subluxation or dislocation. Nondisplaced fracture through the right superior pubic ramus. Ligaments  Suboptimally assessed by CT. Muscles and Tendons Unremarkable Soft tissues Edema throughout the subcutaneous soft tissues. IMPRESSION: Right femoral neck fracture with minimal displacement. Nondisplaced right superior pubic ramus fracture. Electronically Signed   By: Charlett Nose M.D.   On: 03/25/2023 19:34   Chest Portable 1 View  Result Date: 03/25/2023 CLINICAL DATA:  Preop.  Right hip fracture. EXAM: PORTABLE CHEST 1 VIEW COMPARISON:  05/14/2022 FINDINGS: Right Port-A-Cath remains in place, unchanged. Mild elevation of the right hemidiaphragm. Right base atelectasis. Low lung volumes. Left lung clear. No visible effusions. Heart mediastinal contours within normal limits. Aortic atherosclerosis. IMPRESSION: Low lung volumes. Elevated right hemidiaphragm. Right base atelectasis. Electronically Signed   By: Charlett Nose M.D.   On: 03/25/2023 19:11   DG Hip Unilat W or Wo Pelvis 2-3 Views Right  Result Date: 03/25/2023 CLINICAL DATA:  Fall EXAM: DG HIP (WITH OR WITHOUT PELVIS) 2-3V RIGHT COMPARISON:  None Available. FINDINGS: Subcapital right femoral neck fracture is present, nondisplaced. There is no dislocation. Bones are diffusely osteopenic. Joint spaces are maintained. Peripheral vascular calcifications are present. IMPRESSION: Nondisplaced subcapital right femoral neck fracture. Electronically Signed   By: Darliss Cheney M.D.   On: 03/25/2023 17:21   IR Paracentesis  Result Date: 03/20/2023 INDICATION: Patient with a history of cirrhosis with recurrent ascites. Several prior paracentesis at outside hospital. Request made for therapeutic paracentesis. EXAM: ULTRASOUND GUIDED PARACENTESIS MEDICATIONS: 1% lidocaine 10 mL COMPLICATIONS: None immediate. PROCEDURE: Informed written consent was obtained from the patient after a discussion of the risks, benefits and alternatives to treatment. A timeout was performed prior to the initiation of the procedure. Initial ultrasound scanning demonstrates a large  amount of ascites within the right lower abdominal quadrant. The right lower abdomen was prepped and draped in the usual sterile fashion. 1% lidocaine was used for local anesthesia. Following this, a 19 gauge, 7-cm, Yueh catheter was introduced. An ultrasound image was saved for documentation purposes. The paracentesis was performed. The catheter was removed and a dressing was applied. The patient tolerated the procedure well without immediate post procedural complication. FINDINGS: A total of approximately 1.7 L of clear yellow fluid was removed. IMPRESSION: Successful ultrasound-guided paracentesis yielding 1.7 liters of peritoneal fluid. Procedure performed by Alwyn Ren NP PLAN: The patient has required >/=2 paracenteses in a 30 day period and a formal evaluation by the [facility] Interventional Radiology Portal Hypertension Clinic has been arranged. Electronically Signed   By: Marliss Coots M.D.   On: 03/20/2023 15:55   IR Paracentesis  Result Date: 03/15/2023 INDICATION: Patient with history of  cirrhosis, recurrent ascites, several prior paracentesis at outside hospital. Request made for therapeutic paracentesis. EXAM: ULTRASOUND GUIDED THERAPEUTIC PARACENTESIS MEDICATIONS: 10 mL 1% lidocaine COMPLICATIONS: None immediate. PROCEDURE: Informed written consent was obtained from the patient after a discussion of the risks, benefits and alternatives to treatment. A timeout was performed prior to the initiation of the procedure. Initial ultrasound scanning demonstrates a small amount of ascites within the right lower abdominal quadrant. The right lower abdomen was prepped and draped in the usual sterile fashion. 1% lidocaine was used for local anesthesia. Following this, a 19 gauge, 7-cm, Yueh catheter was introduced. An ultrasound image was saved for documentation purposes. The paracentesis was performed. The catheter was removed and a dressing was applied. The patient tolerated the procedure well without  immediate post procedural complication. FINDINGS: A total of approximately 2.4 liters of dark yellow fluid was removed. IMPRESSION: Successful ultrasound-guided paracentesis yielding 2.4 liters of peritoneal fluid. Read by: Loyce Dys PA-C Electronically Signed   By: Simonne Come M.D.   On: 03/15/2023 11:20      Subjective: Patient seen and examined at bedside.  Slow to respond.  Poor historian.  No fever, vomiting, chest pain or shortness of breath reported.  Discharge Exam: Vitals:   03/29/23 2129 03/30/23 0456  BP: (!) 90/49 (!) 108/59  Pulse: 74 73  Resp: 18 18  Temp: 98.1 F (36.7 C) (!) 97.4 F (36.3 C)  SpO2: 99% 100%    General: Pt is alert, awake, not in acute distress.  On room air.  Looks chronically ill and deconditioned and extremely thinly built. Cardiovascular: rate controlled, S1/S2 + Respiratory: bilateral decreased breath sounds at bases with scattered crackles Abdominal: Soft, NT, distended, bowel sounds + Extremities: Trace lower extremity edema; no cyanosis    The results of significant diagnostics from this hospitalization (including imaging, microbiology, ancillary and laboratory) are listed below for reference.     Microbiology: Recent Results (from the past 240 hour(s))  Surgical pcr screen     Status: Abnormal   Collection Time: 03/25/23 11:12 PM   Specimen: Nasal Mucosa; Nasal Swab  Result Value Ref Range Status   MRSA, PCR NEGATIVE NEGATIVE Final   Staphylococcus aureus POSITIVE (A) NEGATIVE Final    Comment: (NOTE) The Xpert SA Assay (FDA approved for NASAL specimens in patients 8 years of age and older), is one component of a comprehensive surveillance program. It is not intended to diagnose infection nor to guide or monitor treatment. Performed at [facility], [address], [place], [place] [address]      Labs: BNP (last 3 results) No results for input(s): "BNP" in the last 8760 hours. Basic Metabolic  Panel: Recent Labs  Lab 03/27/23 1800 03/28/23 0230 03/28/23 1440 03/29/23 0111 03/30/23 0231  NA 131* 129* 128* 129* 127*  K 5.6* 5.5* 4.7 4.4 3.5  CL 101 99 100 100 99  CO2 22 22 21* 21* 23  GLUCOSE 239* 208* 252* 287* 197*  BUN 39* 46* 49* 51* 43*  CREATININE 1.64* 1.84* 1.81* 1.71* 1.26*  CALCIUM 6.9* 6.8* 6.7* 6.9* 7.1*  MG  --   --   --   --  2.1   Liver Function Tests: Recent Labs  Lab 03/30/23 0231  AST 20  ALT 11  ALKPHOS 120  BILITOT 1.2  PROT 5.5*  ALBUMIN 1.9*   No results for input(s): "LIPASE", "AMYLASE" in the last 168 hours. No results for input(s): "AMMONIA" in the last 168 hours. CBC: Recent  Labs  Lab 03/25/23 1648 03/26/23 0353 03/27/23 0339 03/28/23 0230 03/28/23 1440 03/29/23 0111 03/30/23 0231  WBC 4.0 4.0 11.4* 14.7*  --  14.8* 9.8  NEUTROABS 3.1  --   --   --   --   --  5.6  HGB 9.6* 9.3* 8.0* 6.4* 7.0* 7.2* 6.6*  HCT 29.4* 28.7* 25.3* 17.8* 20.6* 21.2* 19.3*  MCV 93.6 95.7 95.1 94.7  --  91.0 94.6  PLT 99* 98* 101* 113*  --  104* 92*   Cardiac Enzymes: No results for input(s): "CKTOTAL", "CKMB", "CKMBINDEX", "TROPONINI" in the last 168 hours. BNP: Invalid input(s): "POCBNP" CBG: Recent Labs  Lab 03/29/23 0749 03/29/23 1208 03/29/23 1812 03/29/23 2124 03/30/23 0716  GLUCAP 251* 246* 245* 249* 175*   D-Dimer No results for input(s): "DDIMER" in the last 72 hours. Hgb A1c No results for input(s): "HGBA1C" in the last 72 hours. Lipid Profile No results for input(s): "CHOL", "HDL", "LDLCALC", "TRIG", "CHOLHDL", "LDLDIRECT" in the last 72 hours. Thyroid function studies No results for input(s): "TSH", "T4TOTAL", "T3FREE", "THYROIDAB" in the last 72 hours.  Invalid input(s): "FREET3" Anemia work up No results for input(s): "VITAMINB12", "FOLATE", "FERRITIN", "TIBC", "IRON", "RETICCTPCT" in the last 72 hours. Urinalysis    Component Value Date/Time   COLORURINE YELLOW 03/20/2023 1047   APPEARANCEUR TURBID (A) 03/20/2023  1047   LABSPEC 1.025 03/20/2023 1047   PHURINE 5.0 03/20/2023 1047   GLUCOSEU NEGATIVE 03/20/2023 1047   HGBUR NEGATIVE 03/20/2023 1047   BILIRUBINUR NEGATIVE 03/20/2023 1047   KETONESUR NEGATIVE 03/20/2023 1047   PROTEINUR 30 (A) 03/20/2023 1047   NITRITE NEGATIVE 03/20/2023 1047   LEUKOCYTESUR MODERATE (A) 03/20/2023 1047   Sepsis Labs Recent Labs  Lab 03/27/23 0339 03/28/23 0230 03/29/23 0111 03/30/23 0231  WBC 11.4* 14.7* 14.8* 9.8   Microbiology Recent Results (from the past 240 hour(s))  Surgical pcr screen     Status: Abnormal   Collection Time: 03/25/23 11:12 PM   Specimen: Nasal Mucosa; Nasal Swab  Result Value Ref Range Status   MRSA, PCR NEGATIVE NEGATIVE Final   Staphylococcus aureus POSITIVE (A) NEGATIVE Final    Comment: (NOTE) The Xpert SA Assay (FDA approved for NASAL specimens in patients 58 years of age and older), is one component of a comprehensive surveillance program. It is not intended to diagnose infection nor to guide or monitor treatment. Performed at [facility], [address] [place], [place] [address]      Time coordinating discharge: 35 minutes  SIGNED:   Glade Lloyd, MD  [facility] 03/30/2023, 11:36 AM

## 2023-03-30 NOTE — Progress Notes (Signed)
CROSS COVER NOTE  NAME: Kathleen Gibson MRN: [id number] DOB : 02-29-1951    Date of Service   03/30/2023   HPI/Events of Note   Nurse paged for critical hemoglobin of 6.6 and hematocrit of 19.3.  Chart review showed previous transfusion.  Interventions   Plan: Ordered new type and screen Ordered 1 unit of PRBCs to be infused        Aryam Zhan Lamin Riyana Biel, MSN, APRN, AGACNP-BC [facility] [place] Pager: [phone]. Check Amion for Availability

## 2023-03-30 NOTE — TOC Progression Note (Signed)
Transition of Care [facility]) - Progression Note   Patient Details  Name: Kathleen Gibson MRN: [id number] Date of Birth: [birth year]  Transition of Care [facility]) CM/SW Contact  Ewing Schlein, LCSW Phone Number: 03/30/2023, 3:14 PM  Clinical Narrative: CSW updated patient and daughter regarding insurance approval for [facility]. Patient is expected to discharge tomorrow with [facility] palliative following the patient at the facility. CSW followed up with Shawn with [facility] to coordinate discharge planning. CSW updated Revonda Standard in admissions at [facility].  Expected Discharge Plan: Skilled Nursing Facility Barriers to Discharge: Continued Medical Work up, SNF Pending bed offer, English as a second language teacher  Expected Discharge Plan and Services In-house Referral: Clinical Social Work Post Acute Care Choice: Skilled Nursing Facility Living arrangements for the past 2 months: Single Family Home Expected Discharge Date: 03/30/23               DME Arranged: N/A DME Agency: NA  Social Determinants of Health (SDOH) Interventions SDOH Screenings   Food Insecurity: No Food Insecurity (03/25/2023)  Housing: Low Risk  (03/25/2023)  Transportation Needs: No Transportation Needs (03/25/2023)  Utilities: Not At Risk (03/25/2023)  Depression (PHQ2-9): Low Risk  (12/27/2022)  Financial Resource Strain: Low Risk  (09/07/2021)  Physical Activity: Inactive (09/07/2021)  Social Connections: Socially Isolated (09/07/2021)  Stress: No Stress Concern Present (09/07/2021)  Tobacco Use: Low Risk  (03/25/2023)   Readmission Risk Interventions     No data to display

## 2023-03-30 NOTE — Consult Note (Signed)
Palliative Care Consult Note                                  Date: 03/30/2023   Patient Name: Kathleen Gibson  DOB: Mar 04, 1951  MRN: [id number]  Age / Sex: 72 y.o., female  PCP: Natalia Leatherwood, DO Referring Physician: Glade Lloyd, MD  Reason for Consultation: Establishing goals of care  HPI/Patient Profile: 72 y.o. female  with past medical history of chronic myelomonocytic leukemia, recently discontinued treatment and entered hospice at home, diabetes mellitus type 2, presented with right hip pain after fall. Admitted for hip fracture. Underwent operative intervention 10/13. She was admitted on 03/25/2023 with right hip fracture status post mechanical fall, nondisplaced right superior pubic ramus fracture, AKI, chronic [name] with anemia, and others.  Palliative medicine was consulted to readdress goals of care.  Past Medical History:  Diagnosis Date   Acute bronchitis due to COVID-19 virus 05/16/2022   Ascites    Chronic myelomonocytic leukemia (HCC)    Diabetes (HCC)    HLD (hyperlipidemia)    Hypertension    Pernicious anemia    Pneumonia 09/05/2019   Renal insufficiency 09/06/2019   Skin rash 12/21/2022   Vitamin D deficiency     Subjective:   This NP Wynne Dust reviewed medical records, received report from team, assessed the patient and then meet at the patient's bedside to discuss diagnosis, prognosis, GOC, EOL wishes disposition and options.  I met with the patient at the bedside, although she was very sleepy having just received pain medicines and not able to carry on a good coherent conversation.  No family is at bedside but did call and speak with her daughter over the phone.   We meet to discuss diagnosis prognosis, GOC, EOL wishes, disposition and options. Concept of Palliative Care was introduced as specialized medical care for people and their families living with serious illness.  If focuses on providing relief from  the symptoms and stress of a serious illness.  The goal is to improve quality of life for both the patient and the family. Values and goals of care important to patient and family were attempted to be elicited.  Created space and opportunity for patient  and family to explore thoughts and feelings regarding current medical situation   Natural trajectory and current clinical status were discussed. Questions and concerns addressed. Patient  encouraged to call with questions or concerns.    Patient/Family Understanding of Illness: The patient daughter understands that the right hip fracture is an acute complication but does not change her overall trajectory of [name], especially since the patient has declined further treatment and elected hospice.  We discussed further details about her current situation and the lines of her chronic ongoing comorbidities.  Goals: Comfort as her disease progresses  Today's Discussion: In addition to discussions described above we had extensive discussion on various topics.  She states that her mom have not been seen by hospice who is in the process of enrolling with home hospice.  This is being managed through the oncology office.  She did decline further treatment of her [name] and ordered to enroll in hospice.  However, she states her mom felt too quickly before getting established.  We discussed her current situation with the hip fracture and plans for SNF rehab.  The decision to elect surgical repair and rehab is in line with her goals for continued independent ambulation  as long as possible.  We discussed plans moving forward after SNF/rehab.  She states her mom will reenroll with hospice when she is done with her rehab.  We discussed various settings of hospice including at home, which should be with her daughter in [place] versus in a long-term care facility which would need to be in [place] given she has [facility].  We discussed that hospice can  be provided in either setting.  She feels her mom will likely need long-term care setting giving her need for more intense around-the-clock care than she and her family can provide.  Given that it is uncertain which location would be best for her we have decided to make a TOC referral to [facility] collective for a rapid turnaround palliative care visit in the nursing home.  They can follow the patient while she is doing her rehab and depending on how she does either transition her to [employer] collective home hospice in place at a long-term care facility versus referral to a hospice organization in [place] likely [employer].  Daughter is on board with this plan.  I shared this in a conference with the patient's attending, TOC, as well as hospital liaison for [employer].  All on board with the plan.  Plan to discharge when transfusions are completed, likely today.  I provided emotional and general support through therapeutic listening, empathy, sharing of stories, and other techniques. I answered all questions and addressed all concerns to the best of my ability.  Review of Systems  Unable to perform ROS: Other  (Pt has just received pain medication)  Objective:   Primary Diagnoses: Present on Admission:  Closed right hip fracture, initial encounter (HCC)  Type 2 diabetes mellitus with diabetic polyneuropathy, without long-term current use of insulin (HCC)  Thrombocytopenia (HCC)  Liver cirrhosis (HCC)  [name] (chronic myelomonocytic leukemia) (HCC)  Acute cystitis   Physical Exam Vitals and nursing note reviewed.  Constitutional:      General: She is sleeping. She is not in acute distress. Cardiovascular:     Rate and Rhythm: Normal rate.  Pulmonary:     Effort: Pulmonary effort is normal. No respiratory distress.  Abdominal:     General: Abdomen is flat. There is no distension.  Skin:    General: Skin is warm and dry.  Neurological:     Mental  Status: She is disoriented.     Comments: Recently medicated     Vital Signs:  BP (!) 108/59 (BP Location: Left Arm)   Pulse 73   Temp (!) 97.4 F (36.3 C)   Resp 18   Ht 5\' 2"  (1.575 m)   Wt 70.4 kg   SpO2 100%   BMI 28.40 kg/m   Palliative Assessment/Data: 50-60%    Advanced Care Planning:   Existing Vynca/ACP Documentation: None  Primary Decision Maker: PATIENT  Code Status/Advance Care Planning: DNR-Limited  A discussion was had today regarding advanced directives. Concepts specific to code status, artifical feeding and hydration, continued IV antibiotics and rehospitalization was had.  The difference between a aggressive medical intervention path and a palliative comfort care path for this patient at this time was had.   Decisions/Changes to ACP: None today  Assessment & Plan:   Impression: 72 year old female with chronic comorbidities and acute presentation as described above.  This point she is in hospital status post fracture repair after a fall and hip fracture.  This is in line with her goals for continuing independent  ambulation and functional status as long as possible despite desire to enroll with hospice after discontinuing treatment for [name].  Plan at this point is to discharge to SNF/rehab for short stay to improve her ability to function independently as is her desire.  We will have outpatient palliative care for quick turnaround to evaluate her to transition either to long-term care with hospice in place versus home hospice with her daughter.  Anticipate discharge in next 24 to 48 hours.  Overall long-term prognosis poor.  SUMMARY OF RECOMMENDATIONS   Remain DNR for now Continue to treat the treatable Anticipate discharge to SNF/rehab in the next 24 hours TOC referral for outpatient palliative care and SNF/rehab Anticipate transition to home hospice versus LTC hospice in place in the coming weeks Discussion had with hospital liaison from [facility]  collective about this Palliative medicine will back off for now as goals are clear and discharge likely today  Symptom Management:  Per primary team PMT is available to assist as needed  Prognosis:  < 6 months  Discharge Planning:  Skilled Nursing Facility for rehab with Palliative care service follow-up   Discussed with: Patient's family, medical team, nursing team, [facility] team, hospital liaison    Thank you for allowing [place] to participate in the care of SRINIDHI SETSER PMT will continue to support holistically.  Time Total: 75 min  Detailed review of medical records (labs, imaging, vital signs), medically appropriate exam, discussed with treatment team, counseling and education to patient, family, & staff, documenting clinical information, medication management, coordination of care  Signed by: Wynne Dust, NP Palliative Medicine Team  Team Phone # [phone] (Nights/Weekends)  03/30/2023, 11:15 AM

## 2023-03-31 DIAGNOSIS — D696 Thrombocytopenia, unspecified: Secondary | ICD-10-CM | POA: Diagnosis not present

## 2023-03-31 DIAGNOSIS — R180 Malignant ascites: Secondary | ICD-10-CM | POA: Diagnosis not present

## 2023-03-31 DIAGNOSIS — E441 Mild protein-calorie malnutrition: Secondary | ICD-10-CM | POA: Diagnosis not present

## 2023-03-31 DIAGNOSIS — E1142 Type 2 diabetes mellitus with diabetic polyneuropathy: Secondary | ICD-10-CM | POA: Diagnosis not present

## 2023-03-31 DIAGNOSIS — D84822 Immunodeficiency due to external causes: Secondary | ICD-10-CM | POA: Diagnosis not present

## 2023-03-31 DIAGNOSIS — R188 Other ascites: Secondary | ICD-10-CM | POA: Diagnosis not present

## 2023-03-31 DIAGNOSIS — R5381 Other malaise: Secondary | ICD-10-CM | POA: Diagnosis not present

## 2023-03-31 DIAGNOSIS — S72001A Fracture of unspecified part of neck of right femur, initial encounter for closed fracture: Secondary | ICD-10-CM | POA: Diagnosis not present

## 2023-03-31 DIAGNOSIS — R2681 Unsteadiness on feet: Secondary | ICD-10-CM | POA: Diagnosis not present

## 2023-03-31 DIAGNOSIS — R2689 Other abnormalities of gait and mobility: Secondary | ICD-10-CM | POA: Diagnosis not present

## 2023-03-31 DIAGNOSIS — K219 Gastro-esophageal reflux disease without esophagitis: Secondary | ICD-10-CM | POA: Diagnosis not present

## 2023-03-31 DIAGNOSIS — Z515 Encounter for palliative care: Secondary | ICD-10-CM | POA: Diagnosis not present

## 2023-03-31 DIAGNOSIS — R262 Difficulty in walking, not elsewhere classified: Secondary | ICD-10-CM | POA: Diagnosis not present

## 2023-03-31 DIAGNOSIS — R140 Abdominal distension (gaseous): Secondary | ICD-10-CM | POA: Diagnosis not present

## 2023-03-31 DIAGNOSIS — C9310 Chronic myelomonocytic leukemia not having achieved remission: Secondary | ICD-10-CM | POA: Diagnosis not present

## 2023-03-31 DIAGNOSIS — S72001D Fracture of unspecified part of neck of right femur, subsequent encounter for closed fracture with routine healing: Secondary | ICD-10-CM | POA: Diagnosis not present

## 2023-03-31 DIAGNOSIS — Z7401 Bed confinement status: Secondary | ICD-10-CM | POA: Diagnosis not present

## 2023-03-31 DIAGNOSIS — Z471 Aftercare following joint replacement surgery: Secondary | ICD-10-CM | POA: Diagnosis not present

## 2023-03-31 DIAGNOSIS — Z95828 Presence of other vascular implants and grafts: Secondary | ICD-10-CM | POA: Diagnosis not present

## 2023-03-31 DIAGNOSIS — M109 Gout, unspecified: Secondary | ICD-10-CM | POA: Diagnosis not present

## 2023-03-31 DIAGNOSIS — M6281 Muscle weakness (generalized): Secondary | ICD-10-CM | POA: Diagnosis not present

## 2023-03-31 DIAGNOSIS — R41841 Cognitive communication deficit: Secondary | ICD-10-CM | POA: Diagnosis not present

## 2023-03-31 DIAGNOSIS — F339 Major depressive disorder, recurrent, unspecified: Secondary | ICD-10-CM | POA: Diagnosis not present

## 2023-03-31 DIAGNOSIS — K7460 Unspecified cirrhosis of liver: Secondary | ICD-10-CM | POA: Diagnosis not present

## 2023-03-31 DIAGNOSIS — N3000 Acute cystitis without hematuria: Secondary | ICD-10-CM | POA: Diagnosis not present

## 2023-03-31 DIAGNOSIS — K769 Liver disease, unspecified: Secondary | ICD-10-CM | POA: Diagnosis not present

## 2023-03-31 DIAGNOSIS — R278 Other lack of coordination: Secondary | ICD-10-CM | POA: Diagnosis not present

## 2023-03-31 DIAGNOSIS — S32599D Other specified fracture of unspecified pubis, subsequent encounter for fracture with routine healing: Secondary | ICD-10-CM | POA: Diagnosis not present

## 2023-03-31 DIAGNOSIS — D649 Anemia, unspecified: Secondary | ICD-10-CM | POA: Diagnosis not present

## 2023-03-31 DIAGNOSIS — K7469 Other cirrhosis of liver: Secondary | ICD-10-CM | POA: Diagnosis not present

## 2023-03-31 DIAGNOSIS — S32599A Other specified fracture of unspecified pubis, initial encounter for closed fracture: Secondary | ICD-10-CM | POA: Diagnosis not present

## 2023-03-31 DIAGNOSIS — G609 Hereditary and idiopathic neuropathy, unspecified: Secondary | ICD-10-CM | POA: Diagnosis not present

## 2023-03-31 DIAGNOSIS — I10 Essential (primary) hypertension: Secondary | ICD-10-CM | POA: Diagnosis not present

## 2023-03-31 LAB — BPAM RBC
Blood Product Expiration Date: 202411102359
ISSUE DATE / TIME: 202410171219
Unit Type and Rh: 5100

## 2023-03-31 LAB — TYPE AND SCREEN
ABO/RH(D): O POS
Antibody Screen: NEGATIVE
Unit division: 0

## 2023-03-31 LAB — GLUCOSE, CAPILLARY: Glucose-Capillary: 151 mg/dL — ABNORMAL HIGH (ref 70–99)

## 2023-03-31 MED ORDER — HEPARIN SOD (PORK) LOCK FLUSH 100 UNIT/ML IV SOLN
500.0000 [IU] | INTRAVENOUS | Status: AC | PRN
  Administered 2023-03-31: 500 [IU]
  Filled 2023-03-31: qty 5

## 2023-03-31 NOTE — Plan of Care (Signed)
  Problem: Coping: Goal: Ability to adjust to condition or change in health will improve Outcome: Adequate for Discharge   Problem: Fluid Volume: Goal: Ability to maintain a balanced intake and output will improve 03/31/2023 1038 by Alice Rieger A, LPN Outcome: Adequate for Discharge 03/31/2023 1037 by Delila Spence, LPN Outcome: Progressing   Problem: Health Behavior/Discharge Planning: Goal: Ability to identify and utilize available resources and services will improve Outcome: Adequate for Discharge Goal: Ability to manage health-related needs will improve Outcome: Adequate for Discharge   Problem: Metabolic: Goal: Ability to maintain appropriate glucose levels will improve 03/31/2023 1038 by Alice Rieger A, LPN Outcome: Adequate for Discharge 03/31/2023 1037 by Alice Rieger A, LPN Outcome: Progressing   Problem: Nutritional: Goal: Maintenance of adequate nutrition will improve Outcome: Adequate for Discharge Goal: Progress toward achieving an optimal weight will improve Outcome: Adequate for Discharge   Problem: Skin Integrity: Goal: Risk for impaired skin integrity will decrease Outcome: Adequate for Discharge   Problem: Tissue Perfusion: Goal: Adequacy of tissue perfusion will improve Outcome: Adequate for Discharge   Problem: Health Behavior/Discharge Planning: Goal: Ability to manage health-related needs will improve Outcome: Adequate for Discharge   Problem: Clinical Measurements: Goal: Ability to maintain clinical measurements within normal limits will improve Outcome: Adequate for Discharge Goal: Will remain free from infection Outcome: Adequate for Discharge Goal: Diagnostic test results will improve Outcome: Adequate for Discharge Goal: Respiratory complications will improve Outcome: Adequate for Discharge   Problem: Activity: Goal: Risk for activity intolerance will decrease 03/31/2023 1038 by Alice Rieger A, LPN Outcome: Adequate for  Discharge 03/31/2023 1037 by Alice Rieger A, LPN Outcome: Progressing   Problem: Nutrition: Goal: Adequate nutrition will be maintained Outcome: Adequate for Discharge   Problem: Elimination: Goal: Will not experience complications related to bowel motility Outcome: Adequate for Discharge Goal: Will not experience complications related to urinary retention Outcome: Adequate for Discharge   Problem: Pain Managment: Goal: General experience of comfort will improve Outcome: Adequate for Discharge   Problem: Safety: Goal: Ability to remain free from injury will improve Outcome: Adequate for Discharge   Problem: Skin Integrity: Goal: Risk for impaired skin integrity will decrease Outcome: Adequate for Discharge   Problem: Education: Goal: Knowledge of the prescribed therapeutic regimen will improve Outcome: Adequate for Discharge Goal: Understanding of discharge needs will improve Outcome: Adequate for Discharge   Problem: Activity: Goal: Ability to avoid complications of mobility impairment will improve Outcome: Adequate for Discharge Goal: Ability to tolerate increased activity will improve Outcome: Adequate for Discharge   Problem: Clinical Measurements: Goal: Postoperative complications will be avoided or minimized Outcome: Adequate for Discharge   Problem: Pain Management: Goal: Pain level will decrease with appropriate interventions Outcome: Adequate for Discharge   Problem: Education: Goal: Understanding of post-operative needs will improve Outcome: Adequate for Discharge Goal: Individualized Educational Video(s) Outcome: Adequate for Discharge   Problem: Clinical Measurements: Goal: Postoperative complications will be avoided or minimized Outcome: Adequate for Discharge   Problem: Respiratory: Goal: Will regain and/or maintain adequate ventilation Outcome: Adequate for Discharge

## 2023-03-31 NOTE — TOC Transition Note (Addendum)
Transition of Care [facility]) - CM/SW Discharge Note  Patient Details  Name: Kathleen Gibson MRN: [id number] Date of Birth: 11-22-1950  Transition of Care [facility]) CM/SW Contact:  Ewing Schlein, LCSW Phone Number: 03/31/2023, 11:01 AM  Clinical Narrative: Patient is medically stable to discharge to [facility] today. Patient will go to room 107 and the number for report is [phone]. Discharge summary, discharge orders, and SNF transfer report faxed to facility in hub. Medical necessity form done; PTAR scheduled. Discharge packet completed. CSW updated patient regarding discharge and transportation being set up. CSW left VM for daughter regarding discharge. LPN updated. CSW updated Shawn with [facility] regarding discharge. TOC signing off.    Final next level of care: Skilled Nursing Facility Barriers to Discharge: Barriers Resolved  Patient Goals and CMS Choice CMS Medicare.gov Compare Post Acute Care list provided to:: Patient Represenative (must comment) Choice offered to / list presented to : Patient, Adult Children  Discharge Placement Existing PASRR number confirmed : 03/27/23          Patient chooses bed at: [facility] Patient to be transferred to facility by: PTAR Name of family member notified: [name] (daughter) Patient and family notified of of transfer: 03/31/23  Discharge Plan and Services Additional resources added to the After Visit Summary for   In-house Referral: Clinical Social Work Post Acute Care Choice: Skilled Nursing Facility          DME Arranged: N/A DME Agency: NA  Social Determinants of Health (SDOH) Interventions SDOH Screenings   Food Insecurity: No Food Insecurity (03/25/2023)  Housing: Low Risk  (03/25/2023)  Transportation Needs: No Transportation Needs (03/25/2023)  Utilities: Not At Risk (03/25/2023)  Depression (PHQ2-9): Low Risk  (12/27/2022)  Financial Resource Strain: Low Risk  (09/07/2021)  Physical Activity: Inactive  (09/07/2021)  Social Connections: Socially Isolated (09/07/2021)  Stress: No Stress Concern Present (09/07/2021)  Tobacco Use: Low Risk  (03/25/2023)   Readmission Risk Interventions     No data to display

## 2023-03-31 NOTE — Progress Notes (Signed)
     Referral previously received for Kathleen Gibson for goals of care discussion. Chart reviewed and updates received from RN. See last PMT note dated 03/30/23.  Chart reviewed and no significant clinical changes or ongoing palliative needs noted. Plan for d/c to SNF for brief rehab stay prior to returning to home vs LTC with hospice services. Pt stable for d/c and anticipate transfer to [facility] today. Shawn with [facility] has been notified by LCSW to help with transition to SNF Palliative then hospice. At this time goals are clear. We will sign off for now.  Thank you for your referral and allowing PMT to assist in Kathleen Gibson's care.   Wynne Dust, NP Palliative Medicine Team Phone: [phone]  NO CHARGE

## 2023-03-31 NOTE — Plan of Care (Signed)
  Problem: Fluid Volume: Goal: Ability to maintain a balanced intake and output will improve Outcome: Progressing   Problem: Metabolic: Goal: Ability to maintain appropriate glucose levels will improve Outcome: Progressing   Problem: Activity: Goal: Risk for activity intolerance will decrease Outcome: Progressing

## 2023-03-31 NOTE — Progress Notes (Signed)
PTAR present for discharge, both transports and NT present and assisting patient to transfer. Gait belt in use. During stand/pivot, patient stated she "feels like she's going to fall." A moment later, patient's legs went limp and patient was assisted via gait belt to floor onto buttock. No injury reported by patient after fall. Assisted back to bed by PTAR staff and NT, patient reported she felt "shaken up" during transition. Patient stated she still felt comfortable to proceed with transfer to facility. MD notified, okay to d/c per MD.

## 2023-03-31 NOTE — Progress Notes (Signed)
Patient is waiting to be discharged to SNF.  She had 1 unit packed red cells transfused on 03/30/2023 and repeat H&H afterwards was 8.5.  She is currently medically stable for discharge to SNF although her prognosis is very poor.  Patient seen and examined at bedside today.  Please refer to the full discharge summary done by me on 03/30/2023 for full details.

## 2023-03-31 NOTE — Progress Notes (Signed)
Physical Therapy Treatment Patient Details Name: Kathleen Gibson MRN: [id number] DOB: 1950/10/15 Today's Date: 03/31/2023   History of Present Illness 72 y.o. female with medical history significant for hypertension, hyperlipidemia, type 2 diabetes mellitus, and chronic myelomonocytic leukemia who recently discontinued treatment for the [name] and has been referred to hospice and now presents with right hip pain after fall. xray=right displaced femoral neck fracture.pt is now s/p  right hip hemiarthroplasty. posterior approach on 03/26/23 per Dr Charlann Boxer    PT Comments   Pt admitted with above diagnosis.  Pt currently with functional limitations due to the deficits listed below (see PT Problem List). Pt in bed when PT arrived. Pt reported not feeling well and elevated R LE pain. Nurse made aware of pt pain report and able to provide medication during therapy session. Pt required max A for bed, mod A x 2 for SPT bed to BSC, amb 3 feet with RW and mod A, and transferred to recliner. Pt left seated in recliner, all needs in place and pt to transition to SNF for rehab today. Pt will benefit from acute skilled PT to increase their independence and safety with mobility to allow discharge.      If plan is discharge home, recommend the following: A lot of help with walking and/or transfers;A lot of help with bathing/dressing/bathroom;Assistance with cooking/housework;Assist for transportation;Help with stairs or ramp for entrance   Can travel by private vehicle     No  Equipment Recommendations  None recommended by PT    Recommendations for Other Services       Precautions / Restrictions Precautions Precautions: Fall Precaution Comments: no posterior hip precautions Restrictions Weight Bearing Restrictions: No RLE Weight Bearing: Weight bearing as tolerated     Mobility  Bed Mobility Overal bed mobility: Needs Assistance Bed Mobility: Supine to Sit     Supine to sit: Max assist, HOB elevated,  Used rails     General bed mobility comments: bed pad used to assist scooting to EOB assist to progress RLE off bed, elevate trunk; incr time and cues    Transfers Overall transfer level: Needs assistance Equipment used: Rolling walker (2 wheels) Transfers: Sit to/from Stand Sit to Stand: Mod assist, +2 safety/equipment, +2 physical assistance, From elevated surface Stand pivot transfers: Mod assist, +2 physical assistance, +2 safety/equipment         General transfer comment: cues for encouragement, pt demonstrated difficulty advancing B LEs L> R to limit R LE WB. pt attempting to sit prior to proper alignment with BSC with narrow BOS and L lateral lean with trun flexion for bed to [facility] transfer, recliner pt required cues for proper UE and AD placement with total assist to scoot posteriorly in recliner.    Ambulation/Gait Ambulation/Gait assistance: Mod assist Gait Distance (Feet): 3 Feet Assistive device: Rolling walker (2 wheels) Gait Pattern/deviations: Step-to pattern, Antalgic, Trunk flexed       General Gait Details: cues for sequencing LE advacement, encouragement, RW manuvering and noted narrow BOS   Stairs             Wheelchair Mobility     Tilt Bed    Modified Rankin (Stroke Patients Only)       Balance Overall balance assessment: History of Falls, Needs assistance Sitting-balance support: No upper extremity supported, Feet supported Sitting balance-Leahy Scale: Fair     Standing balance support: Bilateral upper extremity supported, During functional activity, Reliant on assistive device for balance Standing balance-Leahy Scale: Poor  Cognition Arousal: Alert Behavior During Therapy: WFL for tasks assessed/performed Overall Cognitive Status: Within Functional Limits for tasks assessed Area of Impairment: Orientation, Attention, Memory, Following commands, Problem solving                  Orientation Level: Disoriented to, Time (knows "hospital") Current Attention Level: Sustained Memory: Decreased short-term memory Following Commands: Follows one step commands inconsistently, Follows multi-step commands inconsistently     Problem Solving: Slow processing, Decreased initiation, Difficulty sequencing, Requires verbal cues, Requires tactile cues General Comments: pt Ox3 today, able to follow one step commands in a reasonable amount of time, communicate needs, converse appropriately        Exercises      General Comments        Pertinent Vitals/Pain Pain Assessment Pain Assessment: Faces Faces Pain Scale: Hurts even more Pain Location: right hip Pain Descriptors / Indicators: Grimacing, Sore, Operative site guarding Pain Intervention(s): Limited activity within patient's tolerance, Monitored during session, Repositioned, Patient requesting pain meds-RN notified, RN gave pain meds during session, Ice applied    Home Living Family/patient expects to be discharged to:: Skilled nursing facility Living Arrangements: Children;Other relatives (splits time between dtr's house in [place] and sister's house in [place]) Available Help at Discharge: Family;Available PRN/intermittently             Home Equipment: Rollator (4 wheels);Rolling Walker (2 wheels)      Prior Function            PT Goals (current goals can now be found in the care plan section) Acute Rehab PT Goals PT Goal Formulation: With patient/family Time For Goal Achievement: 04/10/23 Potential to Achieve Goals: Fair Progress towards PT goals: Progressing toward goals    Frequency    Min 3X/week      PT Plan      Co-evaluation              AM-PAC PT "6 Clicks" Mobility   Outcome Measure  Help needed turning from your back to your side while in a flat bed without using bedrails?: A Lot Help needed moving from lying on your back to sitting on the side of a flat bed without using bedrails?:  A Lot Help needed moving to and from a bed to a chair (including a wheelchair)?: A Lot Help needed standing up from a chair using your arms (e.g., wheelchair or bedside chair)?: A Lot Help needed to walk in hospital room?: A Lot Help needed climbing 3-5 steps with a railing? : Total 6 Click Score: 11    End of Session Equipment Utilized During Treatment: Gait belt Activity Tolerance: Patient limited by fatigue;Patient limited by pain Patient left: in chair;with call bell/phone within reach;with chair alarm set;with nursing/sitter in room Nurse Communication: Mobility status PT Visit Diagnosis: Other abnormalities of gait and mobility (R26.89);Difficulty in walking, not elsewhere classified (R26.2)     Time: 5784-6962 PT Time Calculation (min) (ACUTE ONLY): 29 min  Charges:    $Gait Training: 8-22 mins $Therapeutic Exercise: 8-22 mins PT General Charges $$ ACUTE PT VISIT: 1 Visit                     Johnny Bridge, PT Acute Rehab    Jacqualyn Posey 03/31/2023, 12:23 PM

## 2023-04-03 DIAGNOSIS — R5381 Other malaise: Secondary | ICD-10-CM | POA: Diagnosis not present

## 2023-04-03 DIAGNOSIS — S32599A Other specified fracture of unspecified pubis, initial encounter for closed fracture: Secondary | ICD-10-CM | POA: Diagnosis not present

## 2023-04-03 DIAGNOSIS — S72001A Fracture of unspecified part of neck of right femur, initial encounter for closed fracture: Secondary | ICD-10-CM | POA: Diagnosis not present

## 2023-04-03 NOTE — Plan of Care (Signed)
[facility] Tonsillectomy/Adenoidectomy, Postoperative PEDS care plan entered in error.

## 2023-04-05 ENCOUNTER — Inpatient Hospital Stay: Payer: Medicare HMO

## 2023-04-06 DIAGNOSIS — R5381 Other malaise: Secondary | ICD-10-CM | POA: Diagnosis not present

## 2023-04-06 DIAGNOSIS — D84822 Immunodeficiency due to external causes: Secondary | ICD-10-CM | POA: Diagnosis not present

## 2023-04-06 DIAGNOSIS — E441 Mild protein-calorie malnutrition: Secondary | ICD-10-CM | POA: Diagnosis not present

## 2023-04-06 DIAGNOSIS — S72001A Fracture of unspecified part of neck of right femur, initial encounter for closed fracture: Secondary | ICD-10-CM | POA: Diagnosis not present

## 2023-04-06 DIAGNOSIS — N3000 Acute cystitis without hematuria: Secondary | ICD-10-CM | POA: Diagnosis not present

## 2023-04-06 DIAGNOSIS — S72001D Fracture of unspecified part of neck of right femur, subsequent encounter for closed fracture with routine healing: Secondary | ICD-10-CM | POA: Diagnosis not present

## 2023-04-06 DIAGNOSIS — K219 Gastro-esophageal reflux disease without esophagitis: Secondary | ICD-10-CM | POA: Diagnosis not present

## 2023-04-06 DIAGNOSIS — D649 Anemia, unspecified: Secondary | ICD-10-CM | POA: Diagnosis not present

## 2023-04-06 DIAGNOSIS — S32599A Other specified fracture of unspecified pubis, initial encounter for closed fracture: Secondary | ICD-10-CM | POA: Diagnosis not present

## 2023-04-06 DIAGNOSIS — I10 Essential (primary) hypertension: Secondary | ICD-10-CM | POA: Diagnosis not present

## 2023-04-06 DIAGNOSIS — Z471 Aftercare following joint replacement surgery: Secondary | ICD-10-CM | POA: Diagnosis not present

## 2023-04-06 DIAGNOSIS — C9310 Chronic myelomonocytic leukemia not having achieved remission: Secondary | ICD-10-CM | POA: Diagnosis not present

## 2023-04-07 DIAGNOSIS — Z515 Encounter for palliative care: Secondary | ICD-10-CM | POA: Diagnosis not present

## 2023-04-07 DIAGNOSIS — K7469 Other cirrhosis of liver: Secondary | ICD-10-CM | POA: Diagnosis not present

## 2023-04-07 DIAGNOSIS — C9310 Chronic myelomonocytic leukemia not having achieved remission: Secondary | ICD-10-CM | POA: Diagnosis not present

## 2023-04-10 ENCOUNTER — Ambulatory Visit: Payer: Medicare HMO

## 2023-04-10 ENCOUNTER — Ambulatory Visit: Payer: Medicare HMO | Admitting: Hematology

## 2023-04-10 ENCOUNTER — Other Ambulatory Visit: Payer: Medicare HMO

## 2023-04-11 ENCOUNTER — Ambulatory Visit: Payer: Medicare HMO

## 2023-04-11 ENCOUNTER — Ambulatory Visit: Payer: Medicare HMO | Admitting: Family Medicine

## 2023-04-11 DIAGNOSIS — C9310 Chronic myelomonocytic leukemia not having achieved remission: Secondary | ICD-10-CM | POA: Diagnosis not present

## 2023-04-11 DIAGNOSIS — R5381 Other malaise: Secondary | ICD-10-CM | POA: Diagnosis not present

## 2023-04-11 DIAGNOSIS — D696 Thrombocytopenia, unspecified: Secondary | ICD-10-CM | POA: Diagnosis not present

## 2023-04-11 DIAGNOSIS — D649 Anemia, unspecified: Secondary | ICD-10-CM | POA: Diagnosis not present

## 2023-04-12 ENCOUNTER — Other Ambulatory Visit: Payer: Self-pay

## 2023-04-12 ENCOUNTER — Ambulatory Visit: Payer: Medicare HMO

## 2023-04-12 ENCOUNTER — Other Ambulatory Visit: Payer: Medicare HMO

## 2023-04-12 DIAGNOSIS — R180 Malignant ascites: Secondary | ICD-10-CM

## 2023-04-12 DIAGNOSIS — C9310 Chronic myelomonocytic leukemia not having achieved remission: Secondary | ICD-10-CM

## 2023-04-12 NOTE — Progress Notes (Signed)
Verbal order w/readback from Dr. Mosetta Putt for paracentesis STAT to be done at [facility].  Dr. Mosetta Putt received Secure Chat message from Joycelyn Man, NP at Rehab.  Clydie Braun will make arrangements for the pt to be transported from the Rehab to [facility] for the paracentesis.

## 2023-04-13 ENCOUNTER — Ambulatory Visit: Payer: Medicare HMO

## 2023-04-14 ENCOUNTER — Other Ambulatory Visit (HOSPITAL_COMMUNITY): Payer: Medicare HMO

## 2023-04-14 ENCOUNTER — Ambulatory Visit: Payer: Medicare HMO

## 2023-04-14 ENCOUNTER — Encounter (HOSPITAL_COMMUNITY): Payer: Self-pay

## 2023-04-14 ENCOUNTER — Ambulatory Visit (HOSPITAL_COMMUNITY)
Admission: RE | Admit: 2023-04-14 | Discharge: 2023-04-14 | Disposition: A | Discharge status: Home / Self Care | Payer: Medicare HMO | Source: Ambulatory Visit | Attending: Hematology | Admitting: Hematology

## 2023-04-14 DIAGNOSIS — M6281 Muscle weakness (generalized): Secondary | ICD-10-CM | POA: Diagnosis not present

## 2023-04-14 DIAGNOSIS — K7460 Unspecified cirrhosis of liver: Secondary | ICD-10-CM | POA: Diagnosis not present

## 2023-04-14 DIAGNOSIS — C9310 Chronic myelomonocytic leukemia not having achieved remission: Secondary | ICD-10-CM | POA: Insufficient documentation

## 2023-04-14 DIAGNOSIS — R180 Malignant ascites: Secondary | ICD-10-CM | POA: Diagnosis not present

## 2023-04-14 DIAGNOSIS — R188 Other ascites: Secondary | ICD-10-CM | POA: Diagnosis not present

## 2023-04-14 DIAGNOSIS — R262 Difficulty in walking, not elsewhere classified: Secondary | ICD-10-CM | POA: Diagnosis not present

## 2023-04-14 DIAGNOSIS — R278 Other lack of coordination: Secondary | ICD-10-CM | POA: Diagnosis not present

## 2023-04-14 DIAGNOSIS — R2681 Unsteadiness on feet: Secondary | ICD-10-CM | POA: Diagnosis not present

## 2023-04-14 MED ORDER — LIDOCAINE HCL (PF) 2 % IJ SOLN
10.0000 mL | Freq: Once | INTRAMUSCULAR | Status: AC
  Administered 2023-04-14: 10 mL

## 2023-04-14 NOTE — Progress Notes (Signed)
Patient tolerated right sided paracentesis procedure well today and 4.6 Liters of clear dark yellow ascites removed. Patient verbalized understanding of discharge instructions and left via wheelchair with SNF staff at this time with no acute distress noted.

## 2023-04-17 ENCOUNTER — Ambulatory Visit: Payer: Medicare HMO | Admitting: Hematology

## 2023-04-17 ENCOUNTER — Ambulatory Visit: Payer: Medicare HMO

## 2023-04-17 ENCOUNTER — Other Ambulatory Visit: Payer: Medicare HMO

## 2023-04-18 ENCOUNTER — Ambulatory Visit: Payer: Medicare HMO

## 2023-04-18 ENCOUNTER — Encounter: Payer: Self-pay | Admitting: Internal Medicine

## 2023-04-19 ENCOUNTER — Other Ambulatory Visit (HOSPITAL_COMMUNITY): Payer: Self-pay | Admitting: Internal Medicine

## 2023-04-19 ENCOUNTER — Other Ambulatory Visit: Payer: Self-pay | Admitting: Hematology

## 2023-04-19 ENCOUNTER — Ambulatory Visit: Payer: Medicare HMO

## 2023-04-19 ENCOUNTER — Other Ambulatory Visit: Payer: Medicare HMO

## 2023-04-19 DIAGNOSIS — R188 Other ascites: Secondary | ICD-10-CM | POA: Diagnosis not present

## 2023-04-19 DIAGNOSIS — R140 Abdominal distension (gaseous): Secondary | ICD-10-CM | POA: Diagnosis not present

## 2023-04-19 DIAGNOSIS — C9310 Chronic myelomonocytic leukemia not having achieved remission: Secondary | ICD-10-CM | POA: Diagnosis not present

## 2023-04-19 DIAGNOSIS — K7460 Unspecified cirrhosis of liver: Secondary | ICD-10-CM

## 2023-04-19 DIAGNOSIS — D696 Thrombocytopenia, unspecified: Secondary | ICD-10-CM | POA: Diagnosis not present

## 2023-04-20 ENCOUNTER — Ambulatory Visit (HOSPITAL_COMMUNITY)
Admission: RE | Admit: 2023-04-20 | Discharge: 2023-04-20 | Disposition: A | Discharge status: Home / Self Care | Payer: Medicare HMO | Source: Ambulatory Visit | Attending: Internal Medicine | Admitting: Internal Medicine

## 2023-04-20 ENCOUNTER — Ambulatory Visit: Payer: Medicare HMO

## 2023-04-20 ENCOUNTER — Telehealth (HOSPITAL_COMMUNITY): Payer: Self-pay

## 2023-04-20 DIAGNOSIS — K7460 Unspecified cirrhosis of liver: Secondary | ICD-10-CM | POA: Diagnosis not present

## 2023-04-20 DIAGNOSIS — R188 Other ascites: Secondary | ICD-10-CM | POA: Diagnosis not present

## 2023-04-20 DIAGNOSIS — K769 Liver disease, unspecified: Secondary | ICD-10-CM | POA: Insufficient documentation

## 2023-04-20 MED ORDER — LIDOCAINE HCL 1 % IJ SOLN
INTRAMUSCULAR | Status: AC
  Filled 2023-04-20: qty 10

## 2023-04-20 MED ORDER — LIDOCAINE HCL 1 % IJ SOLN
INTRAMUSCULAR | Status: AC
  Filled 2023-04-20: qty 20

## 2023-04-20 NOTE — Telephone Encounter (Signed)
Received a call from [facility] RN). Originally they wanted to schedule pleurx catheter placement but daughter has decided to wait until after Thanksgiving. I will go ahead and send it for review so that it's ready for when they call back to schedule. AB

## 2023-04-20 NOTE — Procedures (Signed)
PROCEDURE SUMMARY:  Successful ultrasound guided paracentesis from the right lower abdomen.   Yielded 5 L of dark yellow fluid.  No immediate complications.  The patient tolerated the procedure well.   Specimen was not sent for labs.  EBL < 1 mL    Kennieth Francois PA-C 04/20/2023 4:31 PM

## 2023-04-21 ENCOUNTER — Ambulatory Visit: Payer: Medicare HMO

## 2023-04-24 ENCOUNTER — Telehealth (HOSPITAL_COMMUNITY): Payer: Self-pay

## 2023-04-24 NOTE — Telephone Encounter (Signed)
-----   Message from Al Corpus Mir sent at 04/21/2023 12:27 PM EST ----- Regarding: RE: Pleurx catheter placement Approved.  Mir ----- Message ----- From: Sharee Pimple Sent: 04/20/2023   9:36 AM EST To: Ir Procedure Requests Subject: Pleurx catheter placement                      Procedure: Pleurx catheter  Ordering: Dr. Mosetta Putt [phone]  Dx: Recurrent ascites, symptom relief - pt is on hospice  Imaging: In epic  Please review.   Thanks,  Fara Boros

## 2023-05-03 ENCOUNTER — Other Ambulatory Visit (HOSPITAL_COMMUNITY): Payer: Self-pay | Admitting: Internal Medicine

## 2023-05-03 DIAGNOSIS — R188 Other ascites: Secondary | ICD-10-CM

## 2023-05-04 ENCOUNTER — Inpatient Hospital Stay (HOSPITAL_COMMUNITY)
Admission: EM | Admit: 2023-05-04 | Discharge: 2023-05-05 | DRG: 951 | Disposition: A | Discharge status: Skilled Nursing Facility | Payer: Medicare HMO | Source: Skilled Nursing Facility | Attending: Internal Medicine | Admitting: Internal Medicine

## 2023-05-04 ENCOUNTER — Encounter (HOSPITAL_COMMUNITY): Payer: Self-pay | Admitting: Emergency Medicine

## 2023-05-04 ENCOUNTER — Other Ambulatory Visit: Payer: Self-pay

## 2023-05-04 DIAGNOSIS — Z833 Family history of diabetes mellitus: Secondary | ICD-10-CM

## 2023-05-04 DIAGNOSIS — E43 Unspecified severe protein-calorie malnutrition: Secondary | ICD-10-CM | POA: Diagnosis present

## 2023-05-04 DIAGNOSIS — Z66 Do not resuscitate: Secondary | ICD-10-CM | POA: Diagnosis present

## 2023-05-04 DIAGNOSIS — R4182 Altered mental status, unspecified: Secondary | ICD-10-CM | POA: Diagnosis not present

## 2023-05-04 DIAGNOSIS — Z886 Allergy status to analgesic agent status: Secondary | ICD-10-CM

## 2023-05-04 DIAGNOSIS — G8929 Other chronic pain: Secondary | ICD-10-CM | POA: Diagnosis not present

## 2023-05-04 DIAGNOSIS — Z96641 Presence of right artificial hip joint: Secondary | ICD-10-CM | POA: Diagnosis not present

## 2023-05-04 DIAGNOSIS — K7210 Chronic hepatic failure without coma: Secondary | ICD-10-CM | POA: Diagnosis not present

## 2023-05-04 DIAGNOSIS — Z800 Family history of malignant neoplasm of digestive organs: Secondary | ICD-10-CM

## 2023-05-04 DIAGNOSIS — R188 Other ascites: Secondary | ICD-10-CM | POA: Diagnosis not present

## 2023-05-04 DIAGNOSIS — R1013 Epigastric pain: Secondary | ICD-10-CM | POA: Diagnosis not present

## 2023-05-04 DIAGNOSIS — D696 Thrombocytopenia, unspecified: Secondary | ICD-10-CM | POA: Diagnosis present

## 2023-05-04 DIAGNOSIS — I10 Essential (primary) hypertension: Secondary | ICD-10-CM | POA: Diagnosis not present

## 2023-05-04 DIAGNOSIS — Z806 Family history of leukemia: Secondary | ICD-10-CM

## 2023-05-04 DIAGNOSIS — Z515 Encounter for palliative care: Secondary | ICD-10-CM | POA: Diagnosis not present

## 2023-05-04 DIAGNOSIS — G609 Hereditary and idiopathic neuropathy, unspecified: Secondary | ICD-10-CM | POA: Diagnosis present

## 2023-05-04 DIAGNOSIS — N390 Urinary tract infection, site not specified: Secondary | ICD-10-CM | POA: Diagnosis not present

## 2023-05-04 DIAGNOSIS — R54 Age-related physical debility: Secondary | ICD-10-CM | POA: Diagnosis present

## 2023-05-04 DIAGNOSIS — G9341 Metabolic encephalopathy: Secondary | ICD-10-CM | POA: Diagnosis not present

## 2023-05-04 DIAGNOSIS — S72001A Fracture of unspecified part of neck of right femur, initial encounter for closed fracture: Secondary | ICD-10-CM | POA: Diagnosis present

## 2023-05-04 DIAGNOSIS — Z6828 Body mass index (BMI) 28.0-28.9, adult: Secondary | ICD-10-CM

## 2023-05-04 DIAGNOSIS — D510 Vitamin B12 deficiency anemia due to intrinsic factor deficiency: Secondary | ICD-10-CM | POA: Diagnosis not present

## 2023-05-04 DIAGNOSIS — M549 Dorsalgia, unspecified: Secondary | ICD-10-CM | POA: Diagnosis present

## 2023-05-04 DIAGNOSIS — C9310 Chronic myelomonocytic leukemia not having achieved remission: Secondary | ICD-10-CM | POA: Diagnosis not present

## 2023-05-04 DIAGNOSIS — E785 Hyperlipidemia, unspecified: Secondary | ICD-10-CM | POA: Diagnosis present

## 2023-05-04 DIAGNOSIS — R531 Weakness: Secondary | ICD-10-CM | POA: Diagnosis not present

## 2023-05-04 DIAGNOSIS — G8918 Other acute postprocedural pain: Secondary | ICD-10-CM | POA: Diagnosis present

## 2023-05-04 DIAGNOSIS — I959 Hypotension, unspecified: Secondary | ICD-10-CM | POA: Diagnosis not present

## 2023-05-04 DIAGNOSIS — D649 Anemia, unspecified: Secondary | ICD-10-CM | POA: Diagnosis not present

## 2023-05-04 DIAGNOSIS — K7460 Unspecified cirrhosis of liver: Secondary | ICD-10-CM | POA: Diagnosis not present

## 2023-05-04 DIAGNOSIS — Z79899 Other long term (current) drug therapy: Secondary | ICD-10-CM

## 2023-05-04 DIAGNOSIS — Z8249 Family history of ischemic heart disease and other diseases of the circulatory system: Secondary | ICD-10-CM | POA: Diagnosis not present

## 2023-05-04 DIAGNOSIS — Z9884 Bariatric surgery status: Secondary | ICD-10-CM

## 2023-05-04 DIAGNOSIS — Z8616 Personal history of COVID-19: Secondary | ICD-10-CM | POA: Diagnosis not present

## 2023-05-04 DIAGNOSIS — E1142 Type 2 diabetes mellitus with diabetic polyneuropathy: Secondary | ICD-10-CM | POA: Diagnosis present

## 2023-05-04 DIAGNOSIS — Z888 Allergy status to other drugs, medicaments and biological substances status: Secondary | ICD-10-CM

## 2023-05-04 DIAGNOSIS — S32591A Other specified fracture of right pubis, initial encounter for closed fracture: Secondary | ICD-10-CM | POA: Diagnosis present

## 2023-05-04 DIAGNOSIS — Z743 Need for continuous supervision: Secondary | ICD-10-CM | POA: Diagnosis not present

## 2023-05-04 LAB — CBC WITH DIFFERENTIAL/PLATELET
Abs Immature Granulocytes: 0.4 10*3/uL — ABNORMAL HIGH (ref 0.00–0.07)
Band Neutrophils: 8 %
Basophils Absolute: 0 10*3/uL (ref 0.0–0.1)
Basophils Relative: 0 %
Eosinophils Absolute: 0 10*3/uL (ref 0.0–0.5)
Eosinophils Relative: 0 %
HCT: 22.4 % — ABNORMAL LOW (ref 36.0–46.0)
Hemoglobin: 7.8 g/dL — ABNORMAL LOW (ref 12.0–15.0)
Lymphocytes Relative: 12 %
Lymphs Abs: 1.7 10*3/uL (ref 0.7–4.0)
MCH: 34.7 pg — ABNORMAL HIGH (ref 26.0–34.0)
MCHC: 34.8 g/dL (ref 30.0–36.0)
MCV: 99.6 fL (ref 80.0–100.0)
Metamyelocytes Relative: 1 %
Monocytes Absolute: 1.8 10*3/uL — ABNORMAL HIGH (ref 0.1–1.0)
Monocytes Relative: 13 %
Myelocytes: 2 %
Neutro Abs: 9.9 10*3/uL — ABNORMAL HIGH (ref 1.7–7.7)
Neutrophils Relative %: 64 %
Platelets: 27 10*3/uL — CL (ref 150–400)
RBC: 2.25 MIL/uL — ABNORMAL LOW (ref 3.87–5.11)
RDW: 22.9 % — ABNORMAL HIGH (ref 11.5–15.5)
WBC: 13.8 10*3/uL — ABNORMAL HIGH (ref 4.0–10.5)
nRBC: 0 % (ref 0.0–0.2)

## 2023-05-04 LAB — COMPREHENSIVE METABOLIC PANEL
ALT: 12 U/L (ref 0–44)
AST: 16 U/L (ref 15–41)
Albumin: 1.7 g/dL — ABNORMAL LOW (ref 3.5–5.0)
Alkaline Phosphatase: 86 U/L (ref 38–126)
Anion gap: 4 — ABNORMAL LOW (ref 5–15)
BUN: 27 mg/dL — ABNORMAL HIGH (ref 8–23)
CO2: 23 mmol/L (ref 22–32)
Calcium: 7.2 mg/dL — ABNORMAL LOW (ref 8.9–10.3)
Chloride: 107 mmol/L (ref 98–111)
Creatinine, Ser: 0.99 mg/dL (ref 0.44–1.00)
GFR, Estimated: >60 mL/min (ref >60)
Glucose, Bld: 120 mg/dL — ABNORMAL HIGH (ref 70–99)
Potassium: 3.8 mmol/L (ref 3.5–5.1)
Sodium: 134 mmol/L — ABNORMAL LOW (ref 135–145)
Total Bilirubin: 2.6 mg/dL — ABNORMAL HIGH (ref <1.2)
Total Protein: 6.9 g/dL (ref 6.5–8.1)

## 2023-05-04 LAB — TYPE AND SCREEN
ABO/RH(D): O POS
Antibody Screen: NEGATIVE

## 2023-05-04 LAB — AMMONIA: Ammonia: 38 umol/L — ABNORMAL HIGH (ref 9–35)

## 2023-05-04 MED ORDER — DIPHENHYDRAMINE HCL 50 MG/ML IJ SOLN: 12.5000 mg | INTRAMUSCULAR | Status: DC | PRN

## 2023-05-04 MED ORDER — POLYVINYL ALCOHOL 1.4 % OP SOLN: 1.0000 [drp] | Freq: Four times a day (QID) | OPHTHALMIC | Status: DC | PRN

## 2023-05-04 MED ORDER — ONDANSETRON 4 MG PO TBDP: 4.0000 mg | ORAL_TABLET | Freq: Four times a day (QID) | ORAL | Status: DC | PRN

## 2023-05-04 MED ORDER — MORPHINE SULFATE (PF) 2 MG/ML IV SOLN
1.0000 mg | INTRAVENOUS | Status: DC | PRN
Start: 2023-05-04 — End: 2023-05-05
  Administered 2023-05-04 – 2023-05-05 (×4): 2 mg via INTRAVENOUS
  Filled 2023-05-04 (×4): qty 1

## 2023-05-04 MED ORDER — LORAZEPAM 2 MG/ML IJ SOLN: 0.5000 mg | INTRAMUSCULAR | Status: DC | PRN

## 2023-05-04 MED ORDER — SODIUM CHLORIDE 0.9 % IV BOLUS
1000.0000 mL | Freq: Once | INTRAVENOUS | Status: AC
  Administered 2023-05-04: 1000 mL via INTRAVENOUS

## 2023-05-04 MED ORDER — ALBUTEROL SULFATE (2.5 MG/3ML) 0.083% IN NEBU: 2.5000 mg | INHALATION_SOLUTION | RESPIRATORY_TRACT | Status: DC | PRN

## 2023-05-04 MED ORDER — BIOTENE DRY MOUTH MT LIQD: 15.0000 mL | OROMUCOSAL | Status: DC | PRN

## 2023-05-04 MED ORDER — ONDANSETRON HCL 4 MG/2ML IJ SOLN: 4.0000 mg | Freq: Four times a day (QID) | INTRAMUSCULAR | Status: DC | PRN

## 2023-05-04 NOTE — H&P (Signed)
[facility] H&P   Patient Demographics:    Kathleen Gibson, is a 72 y.o. female  MRN: [id number]   DOB - 1951/05/08  Admit Date - 05/04/2023  Outpatient Primary MD for the patient is Natalia Leatherwood, DO  Referring MD/NP/PA: Dr. Estell Harpin  Patient coming from: SNF  No chief complaint on file.     HPI:    Kathleen Gibson  is a 72 y.o. female, ith history of chronic myelomonocytic leukemia, recently discontinued treatment and entered hospice at home, diabetes mellitus type 2 , fortunately patient sustained a fall at home, and right hip fracture, for which she was admitted 03/25/2023, required operative intervention for hip repair, during that hospitalization she had significant anemia, requiring PRBC transfusion, and ascites paracentesis, she was seen by palliative medicine, with recommendation has been made for discharge to facility with hospice, apparently at the facility patient had labs done today in anticipation of paracentesis tomorrow for comfort, she has significant lab abnormalities for which she was sent for further evaluation, currently patient is lethargic, moaning and complaining of pain, asking for pain medicine, I have discussed with the daughter by the phone, apparently patient with progressive decline since Sunday, labs obtained while in ED, were significant for thrombocytopenia 27K, anemia of 7.8, leukocytosis at 13.8,low albumin 1.7, calcium 7.2, T billi of 2.6, patient with significant ascites, [facility] hospitalist consulted to admit.    Review of systems:   Patient is altered, cannot provide reliable review of system   With Past History of the following :    Past Medical History:  Diagnosis Date   Acute bronchitis due to COVID-19 virus 05/16/2022   Ascites    Chronic myelomonocytic leukemia (HCC)    Diabetes (HCC)    HLD (hyperlipidemia)    Hypertension    Pernicious anemia     Pneumonia 09/05/2019   Renal insufficiency 09/06/2019   Skin rash 12/21/2022   Vitamin D deficiency       Past Surgical History:  Procedure Laterality Date   ABDOMINAL HERNIA REPAIR  2009   ABDOMINOPLASTY     AXILLARY LYMPH NODE BIOPSY Left 11/11/2020   Procedure: LEFT AXILLARY EXCISIONAL LYMPH NODE BIOPSY;  Surgeon: Darnell Level, MD;  Location: [facility] OR;  Service: General;  Laterality: Left;   CESAREAN SECTION  1978   GASTRIC BYPASS  2000   hemorroid surgery  2007   HIP ARTHROPLASTY Right 03/26/2023   Procedure: ARTHROPLASTY BIPOLAR HIP (HEMIARTHROPLASTY);  Surgeon: Durene Romans, MD;  Location: [facility] ORS;  Service: Orthopedics;  Laterality: Right;   IR IMAGING GUIDED PORT INSERTION  07/26/2021   IR KYPHO EA ADDL LEVEL THORACIC OR LUMBAR  05/31/2021   IR KYPHO THORACIC WITH BONE BIOPSY  05/31/2021   IR PARACENTESIS  03/15/2023   IR PARACENTESIS  03/20/2023   TONSILLECTOMY AND ADENOIDECTOMY  1957      Social History:     Social History  Tobacco Use   Smoking status: Never    Passive exposure: Never   Smokeless tobacco: Never  Substance Use Topics   Alcohol use: No       Family History :     Family History  Problem Relation Age of Onset   Cancer Mother 60       unknown type cancer    Hypertension Mother    Hypertension Father    Heart failure Father    Pneumonia Father    Diabetes Sister    Leukemia Brother    Colon cancer Brother 56   Heart Problems Brother    Leukemia Other    Diabetes Sister    Heart Problems Sister    Cancer Brother    Heart Problems Brother       Home Medications:   Prior to Admission medications   Medication Sig Start Date End Date Taking? Authorizing Provider  allopurinol (ZYLOPRIM) 300 MG tablet Take 300 mg by mouth daily. 02/28/23  Yes [provider]  furosemide (LASIX) 20 MG tablet Take 1 tablet (20 mg total) by mouth 3 (three) times a week. 03/31/23  Yes Glade Lloyd, MD  heparin lock flush 100 UNIT/ML SOLN injection  Inject 500 Units into the vein once. Every 4 weeks   Yes [provider]  hydrOXYzine (ATARAX) 10 MG tablet Take 10 mg by mouth as needed for itching.   Yes [provider]  lactulose (CHRONULAC) 10 GM/15ML solution Take 30 mLs (20 g total) by mouth 2 (two) times daily as needed for mild constipation. As needed to maintain bowel movement 1-3 times a day Patient taking differently: Take 10 g by mouth 2 (two) times daily. As needed to maintain bowel movement 1-3 times a day 03/24/23  Yes Pollyann Samples, NP  LORazepam (ATIVAN) 1 MG tablet Take 1 tablet (1 mg total) by mouth every 8 (eight) hours as needed. for anxiety 03/30/23  Yes Glade Lloyd, MD  Magnesium 400 MG TABS Take 400 mg by mouth in the morning and at bedtime.   Yes [provider]  methocarbamol (ROBAXIN) 500 MG tablet Take 1 tablet (500 mg total) by mouth every 6 (six) hours as needed for muscle spasms. 03/30/23  Yes Glade Lloyd, MD  metoprolol succinate (TOPROL-XL) 25 MG 24 hr tablet Take 1 tablet (25 mg total) by mouth daily. 12/27/22  Yes Kuneff, Renee A, DO  omeprazole (PRILOSEC) 20 MG capsule Take 20 mg by mouth daily. 08/15/22  Yes [provider]  ondansetron (ZOFRAN) 4 MG tablet Take 4 mg by mouth every 8 (eight) hours as needed for nausea or vomiting.   Yes [provider]  oxyCODONE (OXY IR/ROXICODONE) 5 MG immediate release tablet Take 1 tablet (5 mg total) by mouth every 4 (four) hours as needed for severe pain (pain score 7-10) or moderate pain (pain score 4-6) (pain score 7-10). Patient taking differently: Take 5 mg by mouth every 3 (three) hours as needed for severe pain (pain score 7-10) or moderate pain (pain score 4-6) (pain score 7-10). 03/30/23  Yes Glade Lloyd, MD  PARoxetine (PAXIL) 10 MG tablet Take 10 mg by mouth daily. 11/08/22  Yes [provider]  polyethylene glycol powder (GLYCOLAX/MIRALAX) 17 GM/SCOOP powder Take 17 g by mouth daily as needed for mild  constipation or moderate constipation. 03/30/23  Yes Glade Lloyd, MD  potassium chloride (KLOR-CON M10) 10 MEQ tablet Take 1 tablet (10 mEq total) by mouth 3 (three) times a week. Patient taking  differently: Take 10 mEq by mouth 3 (three) times a week. Tuesday, Thursday, Sunday 03/31/23  Yes Glade Lloyd, MD  pregabalin (LYRICA) 75 MG capsule Take 1 capsule (75 mg total) by mouth 2 (two) times daily. 03/30/23  Yes Glade Lloyd, MD  saxagliptin HCl (ONGLYZA) 5 MG TABS tablet Take 5 mg by mouth daily.   Yes [provider]  Vitamin D, Ergocalciferol, (DRISDOL) 1.25 MG (50000 UNIT) CAPS capsule Take 50,000 Units by mouth every 7 (seven) days. Thursday   Yes [provider]  glucose blood (ONETOUCH VERIO) test strip Monitor blood sugars twice daily 09/08/21   Kuneff, Renee A, DO  Lancets (ONETOUCH ULTRASOFT) lancets Monitor blood sugars twice daily 09/08/21   Kuneff, Renee A, DO     Allergies:     Allergies  Allergen Reactions   Asa [Aspirin] Other (See Comments)    Contraindicated d/t low plts   Nsaids Other (See Comments)    Contraindicated d/t low plts   Red Blood Cells Nausea Only and Other (See Comments)    Near end of transfusion pt developed nausea, facial flushing, and tachycardia. See progress note from 03/01/22     Physical Exam:   Vitals  Blood pressure 114/74, pulse 86, temperature 97.9 F (36.6 C), temperature source Oral, resp. rate 17, height 5\' 2"  (1.575 m), weight 71.4 kg, SpO2 97%.   1. General Well appearing female, pale, laying in bed, moaning  2.  Somnolent, open her eyes, answer yes no question, significantly confused, uncomfortable   3.  Early week  4. Ears and Eyes appear Normal, Conjunctivae clear,   5. Supple Neck, No JVD, No cervical lymphadenopathy appriciated,  6. Symmetrical Chest wall movement, air entry at the bases secondary to significant ascites.  7. RRR, No Gallops, Rubs or Murmurs, No Parasternal Heave.  8.  Remain  with massive ascites, shifting dullness, caput medusa   9.  No Cyanosis, Normal Skin Turgor, with significant skin bruising  10.  Diffuse anasarca    Data Review:    CBC Recent Labs  Lab 05/04/23 1933  WBC 13.8*  HGB 7.8*  HCT 22.4*  PLT 27*  MCV 99.6  MCH 34.7*  MCHC 34.8  RDW 22.9*  LYMPHSABS 1.7  MONOABS 1.8*  EOSABS 0.0  BASOSABS 0.0   ------------------------------------------------------------------------------------------------------------------  Chemistries  Recent Labs  Lab 05/04/23 1933  NA 134*  K 3.8  CL 107  CO2 23  GLUCOSE 120*  BUN 27*  CREATININE 0.99  CALCIUM 7.2*  AST 16  ALT 12  ALKPHOS 86  BILITOT 2.6*   ------------------------------------------------------------------------------------------------------------------ estimated creatinine clearance is 48.2 mL/min (by C-G formula based on SCr of 0.99 mg/dL). ------------------------------------------------------------------------------------------------------------------ No results for input(s): "TSH", "T4TOTAL", "T3FREE", "THYROIDAB" in the last 72 hours.  Invalid input(s): "FREET3"  Coagulation profile No results for input(s): "INR", "PROTIME" in the last 168 hours. ------------------------------------------------------------------------------------------------------------------- No results for input(s): "DDIMER" in the last 72 hours. -------------------------------------------------------------------------------------------------------------------  Cardiac Enzymes No results for input(s): "CKMB", "TROPONINI", "MYOGLOBIN" in the last 168 hours.  Invalid input(s): "CK" ------------------------------------------------------------------------------------------------------------------ No results found for: "BNP"   ---------------------------------------------------------------------------------------------------------------  Urinalysis    Component Value Date/Time   COLORURINE  YELLOW 03/20/2023 1047   APPEARANCEUR TURBID (A) 03/20/2023 1047   LABSPEC 1.025 03/20/2023 1047   PHURINE 5.0 03/20/2023 1047   GLUCOSEU NEGATIVE 03/20/2023 1047   HGBUR NEGATIVE 03/20/2023 1047   BILIRUBINUR NEGATIVE 03/20/2023 1047   KETONESUR NEGATIVE 03/20/2023 1047   PROTEINUR 30 (A) 03/20/2023 1047   NITRITE NEGATIVE 03/20/2023  1047   LEUKOCYTESUR MODERATE (A) 03/20/2023 1047    ----------------------------------------------------------------------------------------------------------------   Imaging Results:    No results found.     Assessment & Plan:    Principal Problem:   Ascites Active Problems:   Type 2 diabetes mellitus with diabetic polyneuropathy, without long-term current use of insulin (HCC)   HLD (hyperlipidemia)   ANEMIA, PERNICIOUS   Essential hypertension   End of life care   Hereditary and idiopathic peripheral neuropathy   Thrombocytopenia (HCC)   [name] (chronic myelomonocytic leukemia) (HCC)   Intractable back pain   Liver cirrhosis (HCC)   Normocytic anemia   Acute metabolic encephalopathy    End-stage liver cirrhosis Ascites [name] Thrombocytopenia Normocytic anemia Type 2 diabetes Acute encephalopathy Hypertension Pain, with recent right hip fracture, none displaced right superior pubic ramus fracture Severe protein calorie malnutrition  End-of-life care/goals of care discussion -Patient was seen and evaluated, she is unfortunate 72 year old female with past medical history of chronic myelomonocytic leukemia, family decided to stop treatment, and proceed with hospice, where she was hospice at home, where she sustained a fall, and she sustained right hip fracture, and pelvis fracture, she was hospitalized 10/12,  she underwent intervention 10/13, but hospital stay was significant for anemia, AKI, palliative has been consulted, where decision has been made by the family to transfer to facility with hospice, where it was which is cleared for  comfort measures, and no recent hospitalizations, and as I have discussed with the daughter, it does appear she has been progressively worsening over the last few days, as well as significant amount of discomfort in the setting of her significant large ascites with plan for paracentesis by IR tomorrow, apparently some labs were obtained anticipation of that procedures, where they were significant for severe thrombocytopenia of 27K, so she was sent to ED for further evaluation, this point patient is lethargic, chronically ill-appearing, significantly deconditioned, I have discussed with daughter about goals of care here, and daughter she is concerned about her mother pain, understands patient has end of her life, and her main concern for her is not to suffer, I have discussed with her that best way is to proceed with full comfort measures , as her paracentesis may provide more discomfort and complications than actual comfort especially with her significant thrombocytopenia and high risk to bleed, and it is certainly will recur, and she will require significant amount of pain management given amount of discomfort she is now currently with her fractures and multiple symptoms, so decision has been made to proceed with full comfort measures at this point, no paracentesis, no further labs, I have told her that death will be expected during this hospital stay given she is extremely frail, deconditioned, and unstable, will start her on as needed morphine, Ativan for comfort, and I have told her to call other family members interested in seeing her as will be unlikely discharge from the hospital, and timeframe should be within days.    DVT Prophylaxis: Comfort  AM Labs Ordered, also please review Full Orders  Family Communication: Admission, patients condition and plan of care including tests being ordered have been discussed with the patient and her by phone who indicate understanding and agree with the plan and  Code Status.  Code Status DNR-comfort  Likely DC to hospital discharge expected  Consults called: Palliative medicine requested in EPIC  Admission status: Inpatient  Time spent in minutes : 50 minutes   Huey Bienenstock M.D on 05/04/2023 at 9:51 PM  [facility] - Office  [phone]

## 2023-05-04 NOTE — ED Triage Notes (Signed)
Pt presents via [facility] from [facility] due to abnormal labs. Per EMS, unsure what lab was abnormal - believed to be platelets but was not confirmed by the facility. Pt was supposed to have a paracentesis tomorrow and is having some pain from the abdomen. A&Ox4 at this time. Denies CP or SOB.

## 2023-05-04 NOTE — ED Notes (Signed)
Pt pulled up in bed, knees elevated for safety Pt states comfortable Call light within reach

## 2023-05-04 NOTE — ED Notes (Signed)
ED TO INPATIENT HANDOFF REPORT  ED Nurse Name and Phone #: Leanord Hawking, RN  S Name/Age/Gender Kathleen Gibson 72 y.o. female Room/Bed: APA10/APA10  Code Status   Code Status: Do not attempt resuscitation (DNR) - Comfort care  Home/SNF/Other Nursing Home [facility]) Patient oriented to: self Is this baseline? Yes   Triage Complete: Triage complete  Chief Complaint Ascites [R18.8]  Triage Note Pt presents via [facility] from [facility] due to abnormal labs. Per EMS, unsure what lab was abnormal - believed to be platelets but was not confirmed by the facility. Pt was supposed to have a paracentesis tomorrow and is having some pain from the abdomen. A&Ox4 at this time. Denies CP or SOB.      Allergies Allergies  Allergen Reactions   Asa [Aspirin] Other (See Comments)    Contraindicated d/t low plts   Nsaids Other (See Comments)    Contraindicated d/t low plts   Red Blood Cells Nausea Only and Other (See Comments)    Near end of transfusion pt developed nausea, facial flushing, and tachycardia. See progress note from 03/01/22    Level of Care/Admitting Diagnosis ED Disposition     ED Disposition  Admit   Condition  --   Comment  Hospital Area: [facility] [[id number]]  Level of Care: Med-Surg [16]  Covid Evaluation: Asymptomatic - no recent exposure (last 10 days) testing not required  Diagnosis: Ascites [161096]  Admitting Physician: Chiquita Loth  Attending Physician: Chiquita Loth  Certification:: I certify this patient will need inpatient services for at least 2 midnights  Expected Medical Readiness: 05/06/2023          B Medical/Surgery History Past Medical History:  Diagnosis Date   Acute bronchitis due to COVID-19 virus 05/16/2022   Ascites    Chronic myelomonocytic leukemia (HCC)    Diabetes (HCC)    HLD (hyperlipidemia)    Hypertension    Pernicious anemia    Pneumonia 09/05/2019   Renal insufficiency 09/06/2019   Skin  rash 12/21/2022   Vitamin D deficiency    Past Surgical History:  Procedure Laterality Date   ABDOMINAL HERNIA REPAIR  2009   ABDOMINOPLASTY     AXILLARY LYMPH NODE BIOPSY Left 11/11/2020   Procedure: LEFT AXILLARY EXCISIONAL LYMPH NODE BIOPSY;  Surgeon: Darnell Level, MD;  Location: [facility] OR;  Service: General;  Laterality: Left;   CESAREAN SECTION  1978   GASTRIC BYPASS  2000   hemorroid surgery  2007   HIP ARTHROPLASTY Right 03/26/2023   Procedure: ARTHROPLASTY BIPOLAR HIP (HEMIARTHROPLASTY);  Surgeon: Durene Romans, MD;  Location: [facility] ORS;  Service: Orthopedics;  Laterality: Right;   IR IMAGING GUIDED PORT INSERTION  07/26/2021   IR KYPHO EA ADDL LEVEL THORACIC OR LUMBAR  05/31/2021   IR KYPHO THORACIC WITH BONE BIOPSY  05/31/2021   IR PARACENTESIS  03/15/2023   IR PARACENTESIS  03/20/2023   TONSILLECTOMY AND ADENOIDECTOMY  1957     A IV Location/Drains/Wounds Patient Lines/Drains/Airways Status     Active Line/Drains/Airways     Name Placement date Placement time Site Days   Implanted Port 07/26/21 Right Chest 07/26/21  1550  Chest  647   Peripheral IV 05/04/23 20 G Right Antecubital 05/04/23  1824  Antecubital  less than 1   External Urinary Catheter 05/04/23  1949  --  less than 1            Intake/Output Last 24 hours  Intake/Output Summary (Last 24 hours)  at 05/04/2023 2150 Last data filed at 05/04/2023 2013 Gross per 24 hour  Intake 999 ml  Output --  Net 999 ml    Labs/Imaging Results for orders placed or performed during the hospital encounter of 05/04/23 (from the past 48 hour(s))  CBC with Differential     Status: Abnormal   Collection Time: 05/04/23  7:33 PM  Result Value Ref Range   WBC 13.8 (H) 4.0 - 10.5 K/uL   RBC 2.25 (L) 3.87 - 5.11 MIL/uL   Hemoglobin 7.8 (L) 12.0 - 15.0 g/dL   HCT 09.8 (L) 11.9 - 14.7 %   MCV 99.6 80.0 - 100.0 fL   MCH 34.7 (H) 26.0 - 34.0 pg   MCHC 34.8 30.0 - 36.0 g/dL   RDW 82.9 (H) 56.2 - 13.0 %   Platelets 27 (LL) 150 -  400 K/uL    Comment: Immature Platelet Fraction may be clinically indicated, consider ordering this additional test [id number] REPEATED TO VERIFY PLATELET COUNT CONFIRMED BY SMEAR THIS CRITICAL RESULT HAS VERIFIED AND BEEN CALLED TO A [facility] RN BY KIRSTENE FORSYTH ON 11 21 2024 AT 1959, AND HAS BEEN READ BACK.     nRBC 0.0 0.0 - 0.2 %   Neutrophils Relative % 64 %   Neutro Abs 9.9 (H) 1.7 - 7.7 K/uL   Band Neutrophils 8 %   Lymphocytes Relative 12 %   Lymphs Abs 1.7 0.7 - 4.0 K/uL   Monocytes Relative 13 %   Monocytes Absolute 1.8 (H) 0.1 - 1.0 K/uL   Eosinophils Relative 0 %   Eosinophils Absolute 0.0 0.0 - 0.5 K/uL   Basophils Relative 0 %   Basophils Absolute 0.0 0.0 - 0.1 K/uL   WBC Morphology Mild Left Shift (1-5% metas, occ myelo)     Comment: VACUOLATED NEUTROPHILS   RBC Morphology See Note     Comment: ANISOCYTOSIS   Metamyelocytes Relative 1 %   Myelocytes 2 %   Abs Immature Granulocytes 0.40 (H) 0.00 - 0.07 K/uL    Comment: Performed at [facility], [address], [place], [place] [address]  Comprehensive metabolic panel     Status: Abnormal   Collection Time: 05/04/23  7:33 PM  Result Value Ref Range   Sodium 134 (L) 135 - 145 mmol/L   Potassium 3.8 3.5 - 5.1 mmol/L   Chloride 107 98 - 111 mmol/L   CO2 23 22 - 32 mmol/L   Glucose, Bld 120 (H) 70 - 99 mg/dL    Comment: Glucose reference range applies only to samples taken after fasting for at least 8 hours.   BUN 27 (H) 8 - 23 mg/dL   Creatinine, Ser 8.41 0.44 - 1.00 mg/dL   Calcium 7.2 (L) 8.9 - 10.3 mg/dL   Total Protein 6.9 6.5 - 8.1 g/dL   Albumin 1.7 (L) 3.5 - 5.0 g/dL   AST 16 15 - 41 U/L   ALT 12 0 - 44 U/L   Alkaline Phosphatase 86 38 - 126 U/L   Total Bilirubin 2.6 (H) <1.2 mg/dL   GFR, Estimated >32 >44 mL/min    Comment: (NOTE) Calculated using the CKD-EPI Creatinine Equation (2021)    Anion gap 4 (L) 5 - 15    Comment: Performed at [facility], [address] [place], [place] [address]  Type  and screen     Status: None   Collection Time: 05/04/23  7:33 PM  Result Value Ref Range   ABO/RH(D) O POS    Antibody  Screen NEG    Sample Expiration      04/16/2023,2359 Performed at [facility], [address], [place], [place] [address]   Ammonia     Status: Abnormal   Collection Time: 05/04/23  7:33 PM  Result Value Ref Range   Ammonia 38 (H) 9 - 35 umol/L    Comment: Performed at [facility], [address] [place], [place] [address]   *Note: Due to a large number of results and/or encounters for the requested time period, some results have not been displayed. A complete set of results can be found in Results Review.   No results found.  Pending Labs Unresulted Labs (From admission, onward)    None       Vitals/Pain Today's Vitals   05/04/23 1910 05/04/23 2000 05/04/23 2030 05/04/23 2130  BP:  119/61 115/68 114/74  Pulse:  91 95 86  Resp:  19 20 17   Temp:    97.9 F (36.6 C)  TempSrc:    Oral  SpO2:  97% 97% 97%  Weight: 71.4 kg     Height: 5\' 2"  (1.575 m)     PainSc:        Isolation Precautions No active isolations  Medications Medications  morphine (PF) 2 MG/ML injection 1-2 mg (has no administration in time range)  LORazepam (ATIVAN) injection 0.5 mg (has no administration in time range)  diphenhydrAMINE (BENADRYL) injection 12.5 mg (has no administration in time range)  ondansetron (ZOFRAN-ODT) disintegrating tablet 4 mg (has no administration in time range)    Or  ondansetron (ZOFRAN) injection 4 mg (has no administration in time range)  albuterol (PROVENTIL) (2.5 MG/3ML) 0.083% nebulizer solution 2.5 mg (has no administration in time range)  polyvinyl alcohol (LIQUIFILM TEARS) 1.4 % ophthalmic solution 1 drop (has no administration in time range)  antiseptic oral rinse (BIOTENE) solution 15 mL (has no administration in time range)  sodium chloride 0.9 % bolus 1,000 mL (0 mLs Intravenous Stopped 05/04/23 2013)    Mobility non-ambulatory      Focused Assessments Neuro Assessment Handoff:  Swallow screen pass?  N/A         Neuro Assessment: Exceptions to WDL Neuro Checks:      Has TPA been given? No If patient is a Neuro Trauma and patient is going to OR before floor call report to 4N Charge nurse: [phone] or [phone]   R Recommendations: See Admitting Provider Note  Report given to:   Additional Notes:  N/A

## 2023-05-04 NOTE — ED Notes (Signed)
Hospitalist at the bedside 

## 2023-05-04 NOTE — ED Provider Notes (Signed)
[place] EMERGENCY DEPARTMENT AT [facility] Provider Note   CSN: [id number] Arrival date & time: 05/04/23  4098     History  No chief complaint on file.   Kathleen Gibson is a 72 y.o. female.  Patient presents with abdominal pain.  She is a hospice patient with severe cirrhosis.  She is supposed to get paracentesis tomorrow for comfort.  Patient was sent over here for abnormal labs  The history is provided by the patient and the EMS personnel. No language interpreter was used.  Abdominal Pain Pain location:  Generalized Pain quality: aching   Pain radiates to:  Does not radiate Pain severity:  Moderate Onset quality:  Gradual Timing:  Constant Progression:  Worsening Chronicity:  Recurrent Context: not diet changes   Associated symptoms: no chest pain, no cough, no diarrhea, no fatigue and no hematuria        Home Medications Prior to Admission medications   Medication Sig Start Date End Date Taking? Authorizing Provider  allopurinol (ZYLOPRIM) 300 MG tablet Take 300 mg by mouth daily. 02/28/23  Yes [provider]  furosemide (LASIX) 20 MG tablet Take 1 tablet (20 mg total) by mouth 3 (three) times a week. 03/31/23  Yes Glade Lloyd, MD  heparin lock flush 100 UNIT/ML SOLN injection Inject 500 Units into the vein once. Every 4 weeks   Yes [provider]  hydrOXYzine (ATARAX) 10 MG tablet Take 10 mg by mouth as needed for itching.   Yes [provider]  lactulose (CHRONULAC) 10 GM/15ML solution Take 30 mLs (20 g total) by mouth 2 (two) times daily as needed for mild constipation. As needed to maintain bowel movement 1-3 times a day Patient taking differently: Take 10 g by mouth 2 (two) times daily. As needed to maintain bowel movement 1-3 times a day 03/24/23  Yes Pollyann Samples, NP  LORazepam (ATIVAN) 1 MG tablet Take 1 tablet (1 mg total) by mouth every 8 (eight) hours as needed. for anxiety 03/30/23  Yes Glade Lloyd, MD   Magnesium 400 MG TABS Take 400 mg by mouth in the morning and at bedtime.   Yes [provider]  methocarbamol (ROBAXIN) 500 MG tablet Take 1 tablet (500 mg total) by mouth every 6 (six) hours as needed for muscle spasms. 03/30/23  Yes Glade Lloyd, MD  metoprolol succinate (TOPROL-XL) 25 MG 24 hr tablet Take 1 tablet (25 mg total) by mouth daily. 12/27/22  Yes Kuneff, Renee A, DO  omeprazole (PRILOSEC) 20 MG capsule Take 20 mg by mouth daily. 08/15/22  Yes [provider]  ondansetron (ZOFRAN) 4 MG tablet Take 4 mg by mouth every 8 (eight) hours as needed for nausea or vomiting.   Yes [provider]  oxyCODONE (OXY IR/ROXICODONE) 5 MG immediate release tablet Take 1 tablet (5 mg total) by mouth every 4 (four) hours as needed for severe pain (pain score 7-10) or moderate pain (pain score 4-6) (pain score 7-10). Patient taking differently: Take 5 mg by mouth every 3 (three) hours as needed for severe pain (pain score 7-10) or moderate pain (pain score 4-6) (pain score 7-10). 03/30/23  Yes Glade Lloyd, MD  PARoxetine (PAXIL) 10 MG tablet Take 10 mg by mouth daily. 11/08/22  Yes [provider]  polyethylene glycol powder (GLYCOLAX/MIRALAX) 17 GM/SCOOP powder Take 17 g by mouth daily as needed for mild constipation or moderate constipation. 03/30/23  Yes Glade Lloyd, MD  potassium chloride (KLOR-CON M10) 10 MEQ tablet  Take 1 tablet (10 mEq total) by mouth 3 (three) times a week. Patient taking differently: Take 10 mEq by mouth 3 (three) times a week. Tuesday, Thursday, Sunday 03/31/23  Yes Glade Lloyd, MD  pregabalin (LYRICA) 75 MG capsule Take 1 capsule (75 mg total) by mouth 2 (two) times daily. 03/30/23  Yes Glade Lloyd, MD  saxagliptin HCl (ONGLYZA) 5 MG TABS tablet Take 5 mg by mouth daily.   Yes [provider]  Vitamin D, Ergocalciferol, (DRISDOL) 1.25 MG (50000 UNIT) CAPS capsule Take 50,000 Units by mouth every 7 (seven) days. Thursday    Yes [provider]  glucose blood (ONETOUCH VERIO) test strip Monitor blood sugars twice daily 09/08/21   Kuneff, Renee A, DO  Lancets (ONETOUCH ULTRASOFT) lancets Monitor blood sugars twice daily 09/08/21   Kuneff, Renee A, DO      Allergies    Asa [aspirin], Nsaids, and Red blood cells    Review of Systems   Review of Systems  Constitutional:  Negative for appetite change and fatigue.  HENT:  Negative for congestion, ear discharge and sinus pressure.   Eyes:  Negative for discharge.  Respiratory:  Negative for cough.   Cardiovascular:  Negative for chest pain.  Gastrointestinal:  Positive for abdominal pain. Negative for diarrhea.  Genitourinary:  Negative for frequency and hematuria.  Musculoskeletal:  Negative for back pain.  Skin:  Negative for rash.  Neurological:  Negative for seizures and headaches.  Psychiatric/Behavioral:  Negative for hallucinations.     Physical Exam Updated Vital Signs BP 115/68   Pulse 95   Temp 97.9 F (36.6 C) (Oral)   Resp 20   Ht 5\' 2"  (1.575 m)   Wt 71.4 kg   SpO2 97%   BMI 28.79 kg/m  Physical Exam Vitals and nursing note reviewed. Exam conducted with a chaperone present.  Constitutional:      Appearance: She is well-developed.  HENT:     Head: Normocephalic.     Nose: Nose normal.  Eyes:     General: No scleral icterus.    Conjunctiva/sclera: Conjunctivae normal.  Neck:     Thyroid: No thyromegaly.  Cardiovascular:     Rate and Rhythm: Normal rate and regular rhythm.     Heart sounds: No murmur heard.    No friction rub. No gallop.  Pulmonary:     Breath sounds: No stridor. No wheezing or rales.  Chest:     Chest wall: No tenderness.  Abdominal:     General: There is no distension.     Tenderness: There is abdominal tenderness. There is no rebound.  Musculoskeletal:        General: Normal range of motion.     Cervical back: Neck supple.  Lymphadenopathy:     Cervical: No cervical adenopathy.  Skin:     Findings: No erythema or rash.  Neurological:     Mental Status: She is alert and oriented to person, place, and time.     Motor: No abnormal muscle tone.     Coordination: Coordination normal.  Psychiatric:        Behavior: Behavior normal.     ED Results / Procedures / Treatments   Labs (all labs ordered are listed, but only abnormal results are displayed) Labs Reviewed  CBC WITH DIFFERENTIAL/PLATELET - Abnormal; Notable for the following components:      Result Value   WBC 13.8 (*)    RBC 2.25 (*)    Hemoglobin 7.8 (*)  HCT 22.4 (*)    MCH 34.7 (*)    RDW 22.9 (*)    Platelets 27 (*)    Neutro Abs 9.9 (*)    Monocytes Absolute 1.8 (*)    Abs Immature Granulocytes 0.40 (*)    All other components within normal limits  COMPREHENSIVE METABOLIC PANEL - Abnormal; Notable for the following components:   Sodium 134 (*)    Glucose, Bld 120 (*)    BUN 27 (*)    Calcium 7.2 (*)    Albumin 1.7 (*)    Total Bilirubin 2.6 (*)    Anion gap 4 (*)    All other components within normal limits  AMMONIA - Abnormal; Notable for the following components:   Ammonia 38 (*)    All other components within normal limits  TYPE AND SCREEN    EKG None  Radiology No results found.  Procedures Procedures    Medications Ordered in ED Medications  morphine (PF) 2 MG/ML injection 1-2 mg (has no administration in time range)  LORazepam (ATIVAN) injection 0.5 mg (has no administration in time range)  sodium chloride 0.9 % bolus 1,000 mL (0 mLs Intravenous Stopped 05/04/23 2013)    ED Course/ Medical Decision Making/ A&P                                 Medical Decision Making Amount and/or Complexity of Data Reviewed Labs: ordered.  Risk Decision regarding hospitalization.  Patient with end-stage liver disease.  She had comfort care only.  She will be admitted to medicine for pain control and possible paracentesis        Final Clinical Impression(s) / ED Diagnoses Final  diagnoses:  Epigastric pain    Rx / DC Orders ED Discharge Orders     None         Bethann Berkshire, MD 05/05/23 1253

## 2023-05-05 ENCOUNTER — Ambulatory Visit (HOSPITAL_COMMUNITY): Admission: RE | Admit: 2023-05-05 | Payer: Medicare HMO | Source: Ambulatory Visit

## 2023-05-05 ENCOUNTER — Encounter (HOSPITAL_COMMUNITY): Payer: Self-pay

## 2023-05-05 DIAGNOSIS — K7460 Unspecified cirrhosis of liver: Secondary | ICD-10-CM

## 2023-05-05 DIAGNOSIS — D696 Thrombocytopenia, unspecified: Secondary | ICD-10-CM

## 2023-05-05 DIAGNOSIS — G609 Hereditary and idiopathic neuropathy, unspecified: Secondary | ICD-10-CM | POA: Diagnosis not present

## 2023-05-05 DIAGNOSIS — D649 Anemia, unspecified: Secondary | ICD-10-CM

## 2023-05-05 DIAGNOSIS — R188 Other ascites: Secondary | ICD-10-CM | POA: Diagnosis not present

## 2023-05-05 DIAGNOSIS — I10 Essential (primary) hypertension: Secondary | ICD-10-CM

## 2023-05-05 DIAGNOSIS — Z515 Encounter for palliative care: Secondary | ICD-10-CM | POA: Diagnosis not present

## 2023-05-05 DIAGNOSIS — E1142 Type 2 diabetes mellitus with diabetic polyneuropathy: Secondary | ICD-10-CM | POA: Diagnosis not present

## 2023-05-05 LAB — MRSA NEXT GEN BY PCR, NASAL: MRSA by PCR Next Gen: NOT DETECTED

## 2023-05-05 MED ORDER — MORPHINE SULFATE 20 MG/5ML PO SOLN: 5.0000 mg | ORAL | 0 refills | Status: DC | PRN

## 2023-05-05 MED ORDER — LORAZEPAM 1 MG PO TABS: 1.0000 mg | ORAL_TABLET | Freq: Three times a day (TID) | ORAL | 0 refills | Status: DC | PRN

## 2023-05-05 MED ORDER — HYDROXYZINE HCL 10 MG PO TABS: 10.0000 mg | ORAL_TABLET | Freq: Three times a day (TID) | ORAL | 0 refills | Status: DC | PRN

## 2023-05-05 NOTE — Discharge Summary (Signed)
Physician Discharge Summary   Patient: Kathleen Gibson MRN: [id number] DOB: 23-May-1951  Admit date:     05/04/2023  Discharge date: 05/05/23  Discharge Physician: Vassie Loll   PCP: Felix Pacini A, DO   Recommendations at discharge:  Comfort care and symptomatic management only. Avoid future hospitalizations as much as possible. Continue hospice.  Discharge Diagnoses: Principal Problem:   Ascites Active Problems:   Type 2 diabetes mellitus with diabetic polyneuropathy, without long-term current use of insulin (HCC)   HLD (hyperlipidemia)   ANEMIA, PERNICIOUS   Essential hypertension   End of life care   Hereditary and idiopathic peripheral neuropathy   Thrombocytopenia (HCC)   [name] (chronic myelomonocytic leukemia) (HCC)   Intractable back pain   Liver cirrhosis (HCC)   Normocytic anemia   Acute metabolic encephalopathy  Brief Hospital admission course: As per H&P written by Dr. Randol Kern on 05/04/2023 Kathleen Gibson  is a 72 y.o. female, ith history of chronic myelomonocytic leukemia, recently discontinued treatment and entered hospice at home, diabetes mellitus type 2 , fortunately patient sustained a fall at home, and right hip fracture, for which she was admitted 03/25/2023, required operative intervention for hip repair, during that hospitalization she had significant anemia, requiring PRBC transfusion, and ascites paracentesis, she was seen by palliative medicine, with recommendation has been made for discharge to facility with hospice, apparently at the facility patient had labs done today in anticipation of paracentesis tomorrow for comfort, she has significant lab abnormalities for which she was sent for further evaluation, currently patient is lethargic, moaning and complaining of pain, asking for pain medicine, I have discussed with the daughter by the phone, apparently patient with progressive decline since Sunday, labs obtained while in ED, were significant for  thrombocytopenia 27K, anemia of 7.8, leukocytosis at 13.8,low albumin 1.7, calcium 7.2, T billi of 2.6, patient with significant ascites, [facility] hospitalist consulted to admit.    Assessment and Plan: 1-End-stage liver cirrhosis/CMM L, thrombocytopenia, normocytic anemia, type 2 diabetes, hypertension, severe protein calorie malnutrition, ascites and chronic pain: -Patient overall condition is stabilized after, for management initiated -After long discussion with patient and daughter at bedside decision has been made to pursued end-of-life care and hospice as previously planned at a skilled nursing facility Medications has been adjusted to provide better symptoms management. -Hemodynamically stable to be transfer back to skilled nursing facility. -Continue hospice care. -Continue comfort feeding. -Per discussion with patient and family looking not to have any further paracentesis.  Consultants: Palliative care Procedures performed: See below for x-ray reports. Disposition: Skilled nursing facility Diet recommendation: Comfort feeding (pain attention to sodium level).  DISCHARGE MEDICATION: Allergies as of 05/05/2023       Reactions   Asa [aspirin] Other (See Comments)   Contraindicated d/t low plts   Nsaids Other (See Comments)   Contraindicated d/t low plts   Red Blood Cells Nausea Only, Other (See Comments)   Near end of transfusion pt developed nausea, facial flushing, and tachycardia. See progress note from 03/01/22        Medication List     STOP taking these medications    cholecalciferol 25 MCG (1000 UNIT) tablet Commonly known as: VITAMIN D3   heparin lock flush 100 UNIT/ML Soln injection   Magnesium 400 MG Tabs   metoprolol succinate 25 MG 24 hr tablet Commonly known as: TOPROL-XL   onetouch ultrasoft lancets   OneTouch Verio test strip Generic drug: glucose blood   oxyCODONE 5 MG immediate release tablet Commonly known as:  Oxy IR/ROXICODONE   potassium  chloride 10 MEQ tablet Commonly known as: Klor-Con M10   Vitamin D (Ergocalciferol) 1.25 MG (50000 UNIT) Caps capsule Commonly known as: DRISDOL       TAKE these medications    allopurinol 300 MG tablet Commonly known as: ZYLOPRIM Take 300 mg by mouth daily.   furosemide 20 MG tablet Commonly known as: LASIX Take 1 tablet (20 mg total) by mouth 3 (three) times a week.   hydrOXYzine 10 MG tablet Commonly known as: ATARAX Take 1 tablet (10 mg total) by mouth every 8 (eight) hours as needed for itching. What changed: when to take this   lactulose 10 GM/15ML solution Commonly known as: CHRONULAC Take 30 mLs (20 g total) by mouth 2 (two) times daily as needed for mild constipation. As needed to maintain bowel movement 1-3 times a day What changed:  how much to take when to take this   LORazepam 1 MG tablet Commonly known as: ATIVAN Take 1 tablet (1 mg total) by mouth every 8 (eight) hours as needed. for anxiety   methocarbamol 500 MG tablet Commonly known as: ROBAXIN Take 1 tablet (500 mg total) by mouth every 6 (six) hours as needed for muscle spasms.   morphine 20 MG/5ML solution Take 1.3 mLs (5.2 mg total) by mouth every 3 (three) hours as needed for pain (SOB and comfort.).   omeprazole 20 MG capsule Commonly known as: PRILOSEC Take 20 mg by mouth daily.   ondansetron 4 MG tablet Commonly known as: ZOFRAN Take 4 mg by mouth every 8 (eight) hours as needed for nausea or vomiting.   PARoxetine 10 MG tablet Commonly known as: PAXIL Take 10 mg by mouth daily.   polyethylene glycol powder 17 GM/SCOOP powder Commonly known as: GLYCOLAX/MIRALAX Take 17 g by mouth daily as needed for mild constipation or moderate constipation.   pregabalin 75 MG capsule Commonly known as: LYRICA Take 1 capsule (75 mg total) by mouth 2 (two) times daily.   saxagliptin HCl 5 MG Tabs tablet Commonly known as: ONGLYZA Take 5 mg by mouth daily.        Discharge Exam: Filed  Weights   05/04/23 1910 05/04/23 2242  Weight: 71.4 kg 69.9 kg   General exam: Alert, awake, oriented x 3; in no major distress.  Chronically ill in appearance. Respiratory system: Good saturation appreciated on exam; no using accessory muscles. Cardiovascular system: Rate controlled, no rubs, no gallops, no JVD on exam. Gastrointestinal system: Abdomen is distended and with positive bowel ascites.  Positive bowel sounds; some vague tenderness reported on palpation. Central nervous system: Moving 4 limbs spontaneously.  No focal neurological deficits. Extremities: No cyanosis or clubbing. Skin: No petechiae. Psychiatry: Judgement and insight appear normal.  Flat affect.   Condition at discharge: Stable.  The results of significant diagnostics from this hospitalization (including imaging, microbiology, ancillary and laboratory) are listed below for reference.   Imaging Studies: US Paracentesis  Result Date: 04/21/2023 INDICATION: Liver disease with cirrhosis and recurrent ascites, CML. Request received for diagnostic paracentesis. EXAM: ULTRASOUND GUIDED  PARACENTESIS MEDICATIONS: 12 cc 1% lidocaine COMPLICATIONS: None immediate. PROCEDURE: Informed written consent was obtained from the patient after a discussion of the risks, benefits and alternatives to treatment. A timeout was performed prior to the initiation of the procedure. Initial ultrasound scanning demonstrates a large amount of ascites within the right lower abdominal quadrant. The right lower abdomen was prepped and draped in the usual sterile fashion. 1% lidocaine was used  for local anesthesia. Following this, a 19 gauge, 7-cm, Yueh catheter was introduced. An ultrasound image was saved for documentation purposes. The paracentesis was performed. The catheter was removed and a dressing was applied. The patient tolerated the procedure well without immediate post procedural complication. FINDINGS: A total of approximately 5 L of dark  yellow fluid was removed. Ordering provider did not request laboratory samples IMPRESSION: Successful ultrasound-guided paracentesis yielding 5 liters of peritoneal fluid. Procedure performed by Mina Marble, PA-C Electronically Signed   By: Richarda Overlie M.D.   On: 04/21/2023 08:15   US Paracentesis  Result Date: 04/14/2023 INDICATION: Abdominal distension, recurrent ascites, cirrhosis, CML EXAM: ULTRASOUND GUIDED PARACENTESIS MEDICATIONS: 1% lidocaine local COMPLICATIONS: None immediate. PROCEDURE: Informed written consent was obtained from the patient after a discussion of the risks, benefits and alternatives to treatment. A timeout was performed prior to the initiation of the procedure. Initial ultrasound scanning demonstrates a large amount of ascites within the right lower abdominal quadrant. The right lower abdomen was prepped and draped in the usual sterile fashion. 1% lidocaine was used for local anesthesia. Following this, a 6 Fr Safe-T-Centesis catheter was introduced. An ultrasound image was saved for documentation purposes. The paracentesis was performed. The catheter was removed and a dressing was applied. The patient tolerated the procedure well without immediate post procedural complication. FINDINGS: A total of approximately 4.6 L of serosanguineous slightly hazy peritoneal fluid was removed. IMPRESSION: Successful ultrasound-guided paracentesis yielding 4.6 liters of peritoneal fluid. Electronically Signed   By: Judie Petit.  Shick M.D.   On: 04/14/2023 14:40    Microbiology: Results for orders placed or performed during the hospital encounter of 05/04/23  MRSA Next Gen by PCR, Nasal     Status: None   Collection Time: 05/04/23 10:55 PM   Specimen: Nasal Mucosa; Nasal Swab  Result Value Ref Range Status   MRSA by PCR Next Gen NOT DETECTED NOT DETECTED Final    Comment: (NOTE) The GeneXpert MRSA Assay (FDA approved for NASAL specimens only), is one component of a comprehensive MRSA colonization  surveillance program. It is not intended to diagnose MRSA infection nor to guide or monitor treatment for MRSA infections. Test performance is not FDA approved in patients less than 47 years old. Performed at [facility], [address] [place], [place] [address]    *Note: Due to a large number of results and/or encounters for the requested time period, some results have not been displayed. A complete set of results can be found in Results Review.    Labs: CBC: Recent Labs  Lab 05/04/23 1933  WBC 13.8*  NEUTROABS 9.9*  HGB 7.8*  HCT 22.4*  MCV 99.6  PLT 27*   Basic Metabolic Panel: Recent Labs  Lab 05/04/23 1933  NA 134*  K 3.8  CL 107  CO2 23  GLUCOSE 120*  BUN 27*  CREATININE 0.99  CALCIUM 7.2*   Liver Function Tests: Recent Labs  Lab 05/04/23 1933  AST 16  ALT 12  ALKPHOS 86  BILITOT 2.6*  PROT 6.9  ALBUMIN 1.7*   CBG: No results for input(s): "GLUCAP" in the last 168 hours.  Discharge time spent: greater than 30 minutes.  Signed: Vassie Loll, MD [facility] 05/05/2023

## 2023-05-05 NOTE — TOC Initial Note (Signed)
Transition of Care [facility]) - Initial/Assessment Note    Patient Details  Name: Kathleen Gibson MRN: [id number] Date of Birth: 06-16-1950  Transition of Care [facility]) CM/SW Contact:    Villa Herb, LCSWA Phone Number: 05/05/2023, 2:18 PM  Clinical Narrative:                 Per chart review, CSW notes that pt is a LTC resident at [facility]. CSW spoke to [place] with facility who states pt can return to their facility with continued hospice services if needed. CSW updated MD and RN of this. TOC to follow.   Expected Discharge Plan: Long Term Nursing Home Barriers to Discharge: Continued Medical Work up   Patient Goals and CMS Choice Patient states their goals for this hospitalization and ongoing recovery are:: return to LTC CMS Medicare.gov Compare Post Acute Care list provided to:: Patient Choice offered to / list presented to : Patient [place] ownership interest in [facility].provided to:: Patient    Expected Discharge Plan and Services     Post Acute Care Choice: Skilled Nursing Facility Living arrangements for the past 2 months: Skilled Nursing Facility Expected Discharge Date: 05/05/23                                    Prior Living Arrangements/Services Living arrangements for the past 2 months: Skilled Nursing Facility Lives with:: Facility Resident Patient language and need for interpreter reviewed:: Yes Do you feel safe going back to the place where you live?: Yes      Need for Family Participation in Patient Care: Yes (Comment) Care giver support system in place?: Yes (comment)   Criminal Activity/Legal Involvement Pertinent to Current Situation/Hospitalization: No - Comment as needed  Activities of Daily Living   ADL Screening (condition at time of admission) Independently performs ADLs?: No Does the patient have a NEW difficulty with bathing/dressing/toileting/self-feeding that is expected to last >3 days?: No Does the patient have a NEW  difficulty with getting in/out of bed, walking, or climbing stairs that is expected to last >3 days?: No Does the patient have a NEW difficulty with communication that is expected to last >3 days?: No Is the patient deaf or have difficulty hearing?: No Does the patient have difficulty seeing, even when wearing glasses/contacts?: No Does the patient have difficulty concentrating, remembering, or making decisions?: No  Permission Sought/Granted                  Emotional Assessment         Alcohol / Substance Use: Not Applicable Psych Involvement: No (comment)  Admission diagnosis:  Epigastric pain [R10.13] Ascites [R18.8] Patient Active Problem List   Diagnosis Date Noted   Ascites 05/04/2023   Acute metabolic encephalopathy 05/04/2023   Normocytic anemia 03/27/2023   S/P hip hemiarthroplasty 03/26/2023   Closed right hip fracture, initial encounter (HCC) 03/25/2023   Acute cystitis 03/25/2023   Liver cirrhosis (HCC) 02/14/2023   Skin rash 12/21/2022   Port-A-Cath in place 10/08/2021   Compression fracture of body of thoracic vertebra (HCC) 03/26/2021   Intractable back pain 03/07/2021   Red blood cell antibody positive 12/18/2020   Thrombocytopenia (HCC) 12/17/2020   [name] (chronic myelomonocytic leukemia) (HCC) 12/17/2020   Abdominal lymphadenopathy 01/09/2020   Periaortic lymphadenopathy 01/09/2020   Pelvic lymphadenopathy 01/09/2020   History of noncompliance with medical treatment 09/09/2019   Statin declined 08/09/2018  Polyarthralgia 07/31/2018   Tachycardia 10/25/2017   Iron deficiency 04/10/2017   B12 deficiency 04/06/2017   Hereditary and idiopathic peripheral neuropathy 05/25/2016   End of life care 08/20/2012   INSOMNIA, CHRONIC 10/06/2009   HLD (hyperlipidemia) 10/05/2007   ANEMIA, PERNICIOUS 03/02/2007   Type 2 diabetes mellitus with diabetic polyneuropathy, without long-term current use of insulin (HCC) 01/23/2007   Essential hypertension  12/27/2006   PCP:  Natalia Leatherwood, DO Pharmacy:   [facility] [address], [place] - [address] [place] [address] Phone: [phone] Fax: [phone]  [facility] [address] [place] [address] Phone: [phone] Fax: [phone]  [phone] [place], [place] - [address] [place] [address] Phone: [phone] Fax: [phone]     Social Determinants of Health (SDOH) Social History: SDOH Screenings   Food Insecurity: No Food Insecurity (05/04/2023)  Housing: Patient Declined (05/04/2023)  Transportation Needs: Patient Declined (05/04/2023)  Utilities: Not At Risk (05/04/2023)  Depression (PHQ2-9): Low Risk  (12/27/2022)  Financial Resource Strain: Low Risk  (09/07/2021)  Physical Activity: Inactive (09/07/2021)  Social Connections: Socially Isolated (09/07/2021)  Stress: No Stress Concern Present (09/07/2021)  Tobacco Use: Low Risk  (05/04/2023)   SDOH Interventions:     Readmission Risk Interventions     No data to display

## 2023-05-05 NOTE — TOC Transition Note (Signed)
Transition of Care [facility]) - CM/SW Discharge Note   Patient Details  Name: Kathleen Gibson MRN: [id number] Date of Birth: 1951-01-31  Transition of Care [facility]) CM/SW Contact:  Villa Herb, LCSWA Phone Number: 05/05/2023, 2:53 PM   Clinical Narrative:    CSW updated by MD that pt and family are ready for pt to return to [facility] with hospice. CSW spoke to [place] with facility who states pt can return with [facility]. CSW spoke to [employer] rep who will pull pts chart and have a nurse go to the facility over the weekend to see pt. CSW sent D/C clinicals to facility. Med necessity completed and printed to the floor for RN. Room and report numbers provided to RN. EMS to be called for transport. TOC signing off.     Barriers to Discharge: Continued Medical Work up   Patient Goals and CMS Choice CMS Medicare.gov Compare Post Acute Care list provided to:: Patient Choice offered to / list presented to : Patient  Discharge Placement                         Discharge Plan and Services Additional resources added to the After Visit Summary for       Post Acute Care Choice: Skilled Nursing Facility                               Social Determinants of Health (SDOH) Interventions SDOH Screenings   Food Insecurity: No Food Insecurity (05/04/2023)  Housing: Patient Declined (05/04/2023)  Transportation Needs: Patient Declined (05/04/2023)  Utilities: Not At Risk (05/04/2023)  Depression (PHQ2-9): Low Risk  (12/27/2022)  Financial Resource Strain: Low Risk  (09/07/2021)  Physical Activity: Inactive (09/07/2021)  Social Connections: Socially Isolated (09/07/2021)  Stress: No Stress Concern Present (09/07/2021)  Tobacco Use: Low Risk  (05/04/2023)     Readmission Risk Interventions     No data to display

## 2023-05-05 NOTE — TOC Transition Note (Deleted)
Transition of Care [facility]) - CM/SW Discharge Note   Patient Details  Name: Kathleen Gibson MRN: [id number] Date of Birth: [birth year]  Transition [facility] Care [facility]) CM/SW Contact:  Villa Herb, LCSWA Phone Number: 05/05/2023, 2:16 PM   Clinical Narrative:    Per chart review, CSW notes that pt is a LTC resident at [facility]. CSW spoke to [place] with facility who states pt can return to their facility with continued hospice services if needed. CSW updated MD and RN of this. TOC to follow.    Barriers to Discharge: Continued Medical Work up   Patient Goals and CMS Choice CMS Medicare.gov Compare Post Acute Care list provided to:: Patient Choice offered to / list presented to : Patient  Discharge Placement                         Discharge Plan and Services Additional resources added to the After Visit Summary for       Post Acute Care Choice: Skilled Nursing Facility                               Social Determinants of Health (SDOH) Interventions SDOH Screenings   Food Insecurity: No Food Insecurity (05/04/2023)  Housing: Patient Declined (05/04/2023)  Transportation Needs: Patient Declined (05/04/2023)  Utilities: Not At Risk (05/04/2023)  Depression (PHQ2-9): Low Risk  (12/27/2022)  Financial Resource Strain: Low Risk  (09/07/2021)  Physical Activity: Inactive (09/07/2021)  Social Connections: Socially Isolated (09/07/2021)  Stress: No Stress Concern Present (09/07/2021)  Tobacco Use: Low Risk  (05/04/2023)     Readmission Risk Interventions     No data to display

## 2023-05-05 NOTE — Progress Notes (Signed)
   05/05/23 1130  Spiritual Encounters  Type of Visit Initial  Care provided to: Pt and family  Referral source Other (comment) (Spiritual Consult)  Reason for visit Routine spiritual support  OnCall Visit No   Chaplain responded to a spiritual consult request for support. I met with the patient, Kathleen Gibson and her family. She hs a extensive team of family to support her. Everyone shared that they were in a good place and [name]'s daughter is keeping Sherry on task so to speak. The room was relaxed and everyone was enjoying conversation and supporting one another. As I departed they thanked for stopping by.  Valerie Roys [facility]  [phone]

## 2023-05-14 DEATH — deceased
